# Patient Record
Sex: Female | Born: 1950 | Race: White | Hispanic: No | Marital: Married | Smoking: Former smoker
Health system: Southern US, Community
[De-identification: ages and names within clinical notes are randomized; demographics above are authoritative.]

## PROBLEM LIST (undated history)

## (undated) DIAGNOSIS — F419 Anxiety disorder, unspecified: Secondary | ICD-10-CM

## (undated) DIAGNOSIS — I499 Cardiac arrhythmia, unspecified: Secondary | ICD-10-CM

## (undated) DIAGNOSIS — E782 Mixed hyperlipidemia: Secondary | ICD-10-CM

## (undated) DIAGNOSIS — I209 Angina pectoris, unspecified: Secondary | ICD-10-CM

## (undated) DIAGNOSIS — I6529 Occlusion and stenosis of unspecified carotid artery: Secondary | ICD-10-CM

## (undated) DIAGNOSIS — I10 Essential (primary) hypertension: Secondary | ICD-10-CM

## (undated) DIAGNOSIS — K259 Gastric ulcer, unspecified as acute or chronic, without hemorrhage or perforation: Secondary | ICD-10-CM

## (undated) DIAGNOSIS — S43085A Other dislocation of left shoulder joint, initial encounter: Secondary | ICD-10-CM

## (undated) DIAGNOSIS — M797 Fibromyalgia: Secondary | ICD-10-CM

## (undated) DIAGNOSIS — F32A Depression, unspecified: Secondary | ICD-10-CM

## (undated) DIAGNOSIS — M1990 Unspecified osteoarthritis, unspecified site: Secondary | ICD-10-CM

## (undated) DIAGNOSIS — R0600 Dyspnea, unspecified: Secondary | ICD-10-CM

## (undated) DIAGNOSIS — N186 End stage renal disease: Secondary | ICD-10-CM

## (undated) DIAGNOSIS — I639 Cerebral infarction, unspecified: Secondary | ICD-10-CM

## (undated) DIAGNOSIS — N183 Chronic kidney disease, stage 3 unspecified: Secondary | ICD-10-CM

## (undated) DIAGNOSIS — Z87442 Personal history of urinary calculi: Secondary | ICD-10-CM

## (undated) DIAGNOSIS — Z992 Dependence on renal dialysis: Secondary | ICD-10-CM

## (undated) DIAGNOSIS — J189 Pneumonia, unspecified organism: Secondary | ICD-10-CM

## (undated) DIAGNOSIS — I63542 Cerebral infarction due to unspecified occlusion or stenosis of left cerebellar artery: Secondary | ICD-10-CM

## (undated) DIAGNOSIS — F329 Major depressive disorder, single episode, unspecified: Secondary | ICD-10-CM

## (undated) DIAGNOSIS — D649 Anemia, unspecified: Secondary | ICD-10-CM

## (undated) DIAGNOSIS — I509 Heart failure, unspecified: Secondary | ICD-10-CM

## (undated) DIAGNOSIS — R519 Headache, unspecified: Secondary | ICD-10-CM

## (undated) DIAGNOSIS — R32 Unspecified urinary incontinence: Secondary | ICD-10-CM

## (undated) DIAGNOSIS — E119 Type 2 diabetes mellitus without complications: Secondary | ICD-10-CM

## (undated) HISTORY — DX: Occlusion and stenosis of unspecified carotid artery: I65.29

## (undated) HISTORY — DX: Mixed hyperlipidemia: E78.2

## (undated) HISTORY — DX: Chronic kidney disease, stage 3 (moderate): N18.3

## (undated) HISTORY — DX: Essential (primary) hypertension: I10

## (undated) HISTORY — DX: Fibromyalgia: M79.7

## (undated) HISTORY — DX: Gastric ulcer, unspecified as acute or chronic, without hemorrhage or perforation: K25.9

## (undated) HISTORY — DX: Chronic kidney disease, stage 3 unspecified: N18.30

---

## 1978-10-31 HISTORY — PX: URETHRAL DILATION: SUR417

## 1979-03-02 HISTORY — PX: VAGINAL HYSTERECTOMY: SUR661

## 1979-03-02 HISTORY — PX: COMBINED HYSTERECTOMY VAGINAL W/ MMK / A&P REPAIR: SUR296

## 2004-12-02 ENCOUNTER — Ambulatory Visit: Payer: Self-pay | Admitting: Internal Medicine

## 2004-12-09 ENCOUNTER — Ambulatory Visit: Payer: Self-pay | Admitting: Internal Medicine

## 2004-12-09 ENCOUNTER — Ambulatory Visit (HOSPITAL_COMMUNITY): Admission: RE | Admit: 2004-12-09 | Discharge: 2004-12-09 | Payer: Self-pay | Admitting: Internal Medicine

## 2012-08-25 ENCOUNTER — Ambulatory Visit (INDEPENDENT_AMBULATORY_CARE_PROVIDER_SITE_OTHER): Payer: 59 | Admitting: Obstetrics and Gynecology

## 2012-08-25 ENCOUNTER — Encounter: Payer: Self-pay | Admitting: Obstetrics and Gynecology

## 2012-08-25 VITALS — BP 172/90 | Ht 65.0 in | Wt 200.0 lb

## 2012-08-25 DIAGNOSIS — N8110 Cystocele, unspecified: Secondary | ICD-10-CM

## 2012-08-25 DIAGNOSIS — N390 Urinary tract infection, site not specified: Secondary | ICD-10-CM

## 2012-08-25 DIAGNOSIS — N8111 Cystocele, midline: Secondary | ICD-10-CM

## 2012-08-25 MED ORDER — NITROFURANTOIN MONOHYD MACRO 100 MG PO CAPS: 100.0000 mg | ORAL_CAPSULE | Freq: Two times a day (BID) | ORAL | Status: DC

## 2012-08-25 MED ORDER — UROGESIC-BLUE 81.6 MG PO TABS: 1.0000 | ORAL_TABLET | Freq: Every evening | ORAL | Status: DC | PRN

## 2012-08-25 NOTE — Progress Notes (Signed)
Patient ID: Kelli [name], female   DOB: 14-Feb-1951, 62 y.o.   MRN: [id number]    [facility] Visit  Patient name: Kelli [name] MRN [id number]  Date of birth: 29-Sep-1950  CC & HPI:  Kelli [name] is a 62 y.o. female presenting today for pelvic heaviness. Pt here today for lower abdominal pain/heaviness. Pt has had a partial hysterectomy. Pt states she has had problem for about 3 and a half weeks. She states that she is also having urinary frequency month and a half, states she usually gets up anywhere between 3 to 5 times a night. Pt states she has a heavy feeling in her lower stomach that radiates to her left side. Pt has fibromyalgia and has issues with medications and sleep.  ROS:  No health care in yrs. Sees Dr. Willey Blade now. Constipation,x yrs   Pertinent History Reviewed:  Medical & Surgical Hx:  Reviewed: Significant for HTN.  TVH bladder tack in 40's. Medications: Reviewed & Updated - see associated section Social History: Reviewed -  reports that she has been smoking Cigarettes.  She has a 21 pack-year smoking history. She has never used smokeless tobacco.  Objective Findings:  Vitals: BP 172/90  Ht 5\' 5"  (1.651 m)  Wt 200 lb (90.719 kg)  BMI 33.28 kg/m2   Physical Examination: General appearance - alert, well appearing, and in no distress, oriented to person, place, and time and overweight Abdomen - soft, nontender, nondistended, no masses or organomegaly Pelvic - VULVA: normal appearing vulva with no masses, tenderness or lesions, VAGINA: normal appearing vagina with normal color and discharge, no lesions, PELVIC FLOOR EXAM: cystocele moderate, CERVIX: surgically absent, UTERUS: surgically absent, vaginal cuff well healed mild rectocele    Assessment & Plan:   Cystocele UTI Minimal sui sx Probable incomplete voiding    Plan Urogesic Blue samples x 10       Macrodantin bid x 10 d     U/a  C& s

## 2012-08-25 NOTE — Patient Instructions (Signed)
Cystocele Repair A cystocele is a bulging, drooping hernia or break (rupture) of bladder tissue into the birth canal (vagina). This bulging or rupture occurs on the top front wall of the vagina. CAUSES  Cystocele is associated with weakness of the top front wall of the vagina due to stretching and tearing of the ligaments and muscles in the area. This is often the result of:  Multiple childbirths.  Continuous heavy lifting.  Chronic cough from asthma, emphysema, or smoking.  Being overweight.  Changes from aging.  Previous surgery in the vaginal area.  Menopause with loss of estrogen hormone and weakening of the ligaments and muscles around the bladder. SYMPTOMS   Uncontrolled loss of urine (incontinence) with cough, sneeze, or exercise.  Pelvic pressure.  Frequency or urgency to urinate because of inability to completely empty the bladder.  Bladder infections.  Needing to push on the upper vagina to help yourself pass urine. DIAGNOSIS  A cystocele can be diagnosed by doing a pelvic exam and observing the top of the vagina drooping or bulging into or out of the vagina. TREATMENT  Surgical options:  Cystocele repair is surgery that removes the hernia.  There are also different "sling" operations that may be used. Discuss the different types of surgeries to repair a cystocele with your caregiver. Your caregiver will decide what type of surgery will be best in your case. Nonsurgical options:  Kegel exercises. This helps strengthen and tighten the muscles and tissue in and around the bladder and vagina. This may help with mild cases of cystocele.  A pessary may help the cystocele. A pessary is a plastic or rubber device that lifts the bladder into place. A pessary must be fitted by a doctor.  Tampons or diaphragms that lift the bladder into place are sometimes helpful with a minor or small cystocele.  Estrogen may help with mild cases in menopausal and aging women. LET YOUR  CAREGIVER KNOW ABOUT:   Allergies to food or medicine.  Medicines taken, including vitamins, herbs, eyedrops, over-the-counter medicines, and creams.  Use of steroids (by mouth or creams).  Previous problems with anesthetics or numbing medicines.  History of bleeding problems or blood clots.  Previous surgery.  Other health problems, including diabetes and kidney problems.  Possibility of pregnancy, if this applies. RISKS AND COMPLICATIONS  All surgery is associated with risks.  There are risks with a general anesthesia. You should discuss this with your caregiver.  With spinal or epidural anesthesia, there may be an area that is not numbed, and you could feel pain.  Headache could occur with a spinal or epidural anesthetic.  The catheter you will have after surgery may not work properly or may get blocked and need to be replaced.  Excessive bleeding.  Infection.  Injury to surrounding structures.  Recurrence of the cystocele.  Surgery may not get rid of your symptoms. BEFORE THE PROCEDURE   Do not take aspirin or blood thinners for 1 week prior to surgery, unless instructed otherwise.  Do not eat or drink anything after midnight the night before surgery.  Let your caregiver know if you develop a cold or other infectious problems prior to surgery.  If being admitted the day of surgery, you should be present 1 hour prior to your procedure or as directed by your caregiver.  Plan and arrange for help when you go home from the hospital.  If you smoke, do not smoke for at least 2 weeks before the surgery.  Do not  drink any alcohol for 3 days before the surgery. PROCEDURE  You will be given an anesthetic to prevent you from feeling pain during surgery. This may be a general anesthetic that puts you to sleep, or a spinal or epidural anesthetic. You will be asleep or be numbed through the entire procedure. During cystocele repair, tissue is pulled from the sides and  around the top of the vagina to lift up the hernia. This removes the hernia so that the top of the vagina does not fall into the opening of the vagina. AFTER THE PROCEDURE  After surgery, you will be taken to the recovery room where a nurse will take care of you, checking your breathing, blood pressure, pulse, and your progress. When your caregiver feels you are stable, you will be taken to your room. You will have a drainage tube (Foley catheter) that will drain your bladder for 2 to 7 days or longer, until your bladder is working properly. This catheter is placed prior to surgery to help keep your bladder empty and out of the way during the procedure. After surgery, this will make passing your urine easier. The catheter will be removed when you can easily pass urine without this assistance. You may have gauze packing in the vagina that will be removed 1 to 2 days after the surgery. Usually, you will be given a medicine (antibiotic) that kills germs. You will be given pain medicine as needed. You can usually go home in 3 to 5 days. HOME CARE INSTRUCTIONS   Do not take baths. Take showers until your caregiver informs you otherwise.  Take antibiotics as directed by your caregiver.  Exercise as instructed. Do not perform exercises which increase the pressure inside your belly (abdomen), such as sit-ups or lifting weights, until your caregiver has given permission. Walking exercise is preferred.  Only take over-the-counter or prescription medicines for pain and discomfort as directed by your caregiver.  Do not drink alcohol while taking pain medicine.  Do not lift anything over 5 pounds.  Do not drive until your caregiver gives you permission.  Get plenty of rest and sleep.  Have someone help with your household chores for 1 to 2 weeks.  If you develop constipation, you may take a mild laxative with your caregiver's permission. Eating bran foods and drinking enough water and fluids to keep your  urine clear or pale yellow helps with constipation.  Do not take aspirin. It may cause bleeding.  You may resume normal diet and unstrenuous activities as directed.  Do not douche, use tampons, or engage in intercourse until your surgeon has given permission.  Change bandages (dressings) as directed.  Make and keep all your postoperative appointments. SEEK MEDICAL CARE IF:   You have abnormal vaginal discharge.  You develop a rash.  You are having a reaction to your medicine.  You develop nausea or vomiting. SEEK IMMEDIATE MEDICAL CARE IF:   You have redness, swelling, or increasing pain in the vaginal area.  You notice pus coming from the vagina.  You have a fever.  You notice a bad smell coming from the vagina.  You have increasing abdominal pain.  You have frequent urination or you notice burning during urination.  You notice blood in your urine.  You have excessive vaginal bleeding.  You cannot urinate. MAKE SURE YOU:   Understand these instructions.  Will watch your condition.  Will get help right away if you are not doing well or get worse. Document Released:  02/13/2000 Document Revised: 05/10/2011 Document Reviewed: 05/15/2009 [employer] Patient Information 2014 [place].

## 2012-09-14 ENCOUNTER — Encounter: Payer: Self-pay | Admitting: Obstetrics and Gynecology

## 2012-09-14 ENCOUNTER — Ambulatory Visit (INDEPENDENT_AMBULATORY_CARE_PROVIDER_SITE_OTHER): Payer: 59 | Admitting: Obstetrics and Gynecology

## 2012-09-14 VITALS — BP 164/110 | Ht 65.0 in | Wt 200.6 lb

## 2012-09-14 DIAGNOSIS — IMO0002 Reserved for concepts with insufficient information to code with codable children: Secondary | ICD-10-CM

## 2012-09-14 DIAGNOSIS — N8111 Cystocele, midline: Secondary | ICD-10-CM

## 2012-09-14 NOTE — Progress Notes (Signed)
   [facility] Visit  Patient name: Kelli [name] MRN [id number]  Date of birth: 28-Feb-1951  CC & HPI:  Kelli [name] is a 62 y.o. female presenting today for followup cystocele, pelvic pressure, htn.  Now on new med HCTZ dr Quintin Alto.along with Taztia xt 360.  ROS:  1. Pt looking for new MD , frustrated with [facility]. Not enough time 2   Pertinent History Reviewed:  Medical & Surgical Hx:  Reviewed: Significant for  Medications: Reviewed & Updated - see associated section Social History: Reviewed -  reports that she has been smoking Cigarettes.  She has a 21 pack-year smoking history. She has never used smokeless tobacco. Has had episode of sui c cough this wk. Objective Findings:  Vitals: BP 210/110  Ht 5\' 5"  (1.651 m)  Wt 200 lb 9.6 oz (90.992 kg)  BMI 33.38 kg/m2 bp's at home better than here. Pt checks dailybp's are in [id number] diastolic.  Physical Examination: General appearance - alert, well appearing, and in no distress, oriented to person, place, and time and overweight Mental status - alert, oriented to person, place, and time, normal mood, behavior, speech, dress, motor activity, and thought processes   Assessment & Plan:   Cystocele No evidence of uti Chronic htn  Obesity    Weight loss Reck 3 ms Continue new diet habits Reck earlier prn complaints.

## 2012-09-14 NOTE — Patient Instructions (Signed)
Keep up the weight loss And the new diet habits Let Dr know if cough continues Return 3 months.

## 2012-09-15 ENCOUNTER — Ambulatory Visit: Payer: 59 | Admitting: Obstetrics and Gynecology

## 2012-09-29 DIAGNOSIS — I639 Cerebral infarction, unspecified: Secondary | ICD-10-CM

## 2012-09-29 HISTORY — DX: Cerebral infarction, unspecified: I63.9

## 2012-10-01 ENCOUNTER — Emergency Department (HOSPITAL_COMMUNITY): Payer: 59

## 2012-10-01 ENCOUNTER — Encounter (HOSPITAL_COMMUNITY): Payer: Self-pay | Admitting: *Deleted

## 2012-10-01 ENCOUNTER — Emergency Department (HOSPITAL_COMMUNITY)
Admission: EM | Admit: 2012-10-01 | Discharge: 2012-10-01 | Disposition: A | Discharge status: Home / Self Care | Payer: 59 | Attending: Emergency Medicine | Admitting: Emergency Medicine

## 2012-10-01 DIAGNOSIS — R209 Unspecified disturbances of skin sensation: Secondary | ICD-10-CM | POA: Insufficient documentation

## 2012-10-01 DIAGNOSIS — E876 Hypokalemia: Secondary | ICD-10-CM | POA: Insufficient documentation

## 2012-10-01 DIAGNOSIS — R059 Cough, unspecified: Secondary | ICD-10-CM | POA: Insufficient documentation

## 2012-10-01 DIAGNOSIS — R062 Wheezing: Secondary | ICD-10-CM | POA: Insufficient documentation

## 2012-10-01 DIAGNOSIS — R05 Cough: Secondary | ICD-10-CM | POA: Insufficient documentation

## 2012-10-01 DIAGNOSIS — R5383 Other fatigue: Secondary | ICD-10-CM | POA: Insufficient documentation

## 2012-10-01 DIAGNOSIS — R5381 Other malaise: Secondary | ICD-10-CM | POA: Insufficient documentation

## 2012-10-01 DIAGNOSIS — H538 Other visual disturbances: Secondary | ICD-10-CM | POA: Insufficient documentation

## 2012-10-01 DIAGNOSIS — F17200 Nicotine dependence, unspecified, uncomplicated: Secondary | ICD-10-CM | POA: Insufficient documentation

## 2012-10-01 DIAGNOSIS — Z79899 Other long term (current) drug therapy: Secondary | ICD-10-CM | POA: Insufficient documentation

## 2012-10-01 DIAGNOSIS — Z8739 Personal history of other diseases of the musculoskeletal system and connective tissue: Secondary | ICD-10-CM | POA: Insufficient documentation

## 2012-10-01 LAB — URINALYSIS, ROUTINE W REFLEX MICROSCOPIC
Glucose, UA: NEGATIVE mg/dL
Ketones, ur: NEGATIVE mg/dL
Leukocytes, UA: NEGATIVE
pH: 7 (ref 5.0–8.0)

## 2012-10-01 LAB — CBC WITH DIFFERENTIAL/PLATELET
Basophils Absolute: 0 10*3/uL (ref 0.0–0.1)
Basophils Relative: 0 % (ref 0–1)
HCT: 35.5 % — ABNORMAL LOW (ref 36.0–46.0)
Lymphocytes Relative: 23 % (ref 12–46)
MCHC: 34.4 g/dL (ref 30.0–36.0)
Neutro Abs: 7.2 10*3/uL (ref 1.7–7.7)
Neutrophils Relative %: 67 % (ref 43–77)
Platelets: 339 10*3/uL (ref 150–400)
RDW: 13.2 % (ref 11.5–15.5)
WBC: 10.8 10*3/uL — ABNORMAL HIGH (ref 4.0–10.5)

## 2012-10-01 LAB — COMPREHENSIVE METABOLIC PANEL
ALT: 15 U/L (ref 0–35)
AST: 13 U/L (ref 0–37)
Albumin: 3.2 g/dL — ABNORMAL LOW (ref 3.5–5.2)
Alkaline Phosphatase: 82 U/L (ref 39–117)
CO2: 28 mEq/L (ref 19–32)
Chloride: 96 mEq/L (ref 96–112)
Creatinine, Ser: 1.15 mg/dL — ABNORMAL HIGH (ref 0.50–1.10)
GFR calc non Af Amer: 50 mL/min — ABNORMAL LOW (ref >90)
Potassium: 2.6 mEq/L — CL (ref 3.5–5.1)
Sodium: 134 mEq/L — ABNORMAL LOW (ref 135–145)
Total Bilirubin: 0.2 mg/dL — ABNORMAL LOW (ref 0.3–1.2)

## 2012-10-01 MED ORDER — POTASSIUM CHLORIDE 10 MEQ/100ML IV SOLN
10.0000 meq | Freq: Once | INTRAVENOUS | Status: AC
  Administered 2012-10-01: 10 meq via INTRAVENOUS
  Filled 2012-10-01: qty 100

## 2012-10-01 MED ORDER — POTASSIUM CHLORIDE CRYS ER 20 MEQ PO TBCR
40.0000 meq | EXTENDED_RELEASE_TABLET | Freq: Once | ORAL | Status: AC
  Administered 2012-10-01: 40 meq via ORAL
  Filled 2012-10-01: qty 2

## 2012-10-01 MED ORDER — SODIUM CHLORIDE 0.9 % IV BOLUS (SEPSIS)
500.0000 mL | Freq: Once | INTRAVENOUS | Status: AC
  Administered 2012-10-01: 500 mL via INTRAVENOUS

## 2012-10-01 NOTE — Discharge Instructions (Signed)
Hypokalemia Hypokalemia means a low potassium level in the blood.Potassium is an electrolyte that helps regulate the amount of fluid in the body. It also stimulates muscle contraction and maintains a stable acid-base balance.Most of the body's potassium is inside of cells, and only a very small amount is in the blood. Because the amount in the blood is so small, minor changes can have big effects. PREPARATION FOR TEST Testing for potassium requires taking a blood sample taken by needle from a vein in the arm. The skin is cleaned thoroughly before the sample is drawn. There is no other special preparation needed. NORMAL VALUES Potassium levels below 3.5 mEq/L are abnormally low. Levels above 5.1 mEq/L are abnormally high. Ranges for normal findings may vary among different laboratories and hospitals. You should always check with your doctor after having lab work or other tests done to discuss the meaning of your test results and whether your values are considered within normal limits. MEANING OF TEST  Your caregiver will go over the test results with you and discuss the importance and meaning of your results, as well as treatment options and the need for additional tests, if necessary. A potassium level is frequently part of a routine medical exam. It is usually included as part of a whole "panel" of tests for several blood salts (such as Sodium and Chloride). It may be done as part of follow-up when a low potassium level was found in the past or other blood salts are suspected of being out of balance. A low potassium level might be suspected if you have one or more of the following:  Symptoms of weakness.  Abnormal heart rhythms.  High blood pressure and are taking medication to control this, especially water pills (diuretics).  Kidney disease that can affect your potassium level .  Diabetes requiring the use of insulin. The potassium may fall after taking insulin, especially if the diabetes  had been out of control for a while.  A condition requiring the use of cortisone-type medication or certain types of antibiotics.  Vomiting and/or diarrhea for more than a day or two.  A stomach or intestinal condition that may not permit appropriate absorption of potassium.  Fainting episodes.  Mental confusion. OBTAINING TEST RESULTS It is your responsibility to obtain your test results. Ask the lab or department performing the test when and how you will get your results.  Please contact your caregiver directly if you have not received the results within one week. At that time, ask if there is anything different or new you should be doing in relation to the results. TREATMENT Hypokalemia can be treated with potassium supplements taken by mouth and/or adjustments in your current medications. A diet high in potassium is also helpful. Foods with high potassium content are:  Peas, lentils, lima beans, nuts, and dried fruit.  Whole grain and bran cereals and breads.  Fresh fruit, vegetables (bananas, cantaloupe, grapefruit, oranges, tomatoes, honeydew melons, potatoes).  Orange and tomato juices.  Meats. If potassium supplement has been prescribed for you today or your medications have been adjusted, see your personal caregiver in time02 for a re-check. SEEK MEDICAL CARE IF:  There is a feeling of worsening weakness.  You experience repeated chest palpitations.  You are diabetic and having difficulty keeping your blood sugars in the normal range.  You are experiencing vomiting and/or diarrhea.  You are having difficulty with any of your regular medications. SEEK IMMEDIATE MEDICAL CARE IF:  You experience chest pain, shortness of  breath, or episodes of dizziness.  You have been having vomiting or diarrhea for more than 2 days.  You have a fainting episode. MAKE SURE YOU:   Understand these instructions.  Will watch your condition.  Will get help right away if you are  not doing well or get worse. Document Released: 02/15/2005 Document Revised: 05/10/2011 Document Reviewed: 01/27/2008 [facility] Patient Information 2014 [place], [place].   Foods Rich in Potassium Food / Potassium (mg)  Apricots, dried,  cup / 378 mg   Apricots, raw, 1 cup halves / 401 mg   Avocado,  / 487 mg   Banana, 1 large / 487 mg   Beef, lean, round, 3 oz / 202 mg   Cantaloupe, 1 cup cubes / 427 mg   Dates, medjool, 5 whole / 835 mg   Ham, cured, 3 oz / 212 mg   Lentils, dried,  cup / 458 mg   Lima beans, frozen,  cup / 258 mg   Orange, 1 large / 333 mg   Orange juice, 1 cup / 443 mg   Peaches, dried,  cup / 398 mg   Peas, split, cooked,  cup / 355 mg   Potato, boiled, 1 medium / 515 mg   Prunes, dried, uncooked,  cup / 318 mg   Raisins,  cup / 309 mg   Salmon, pink, raw, 3 oz / 275 mg   Sardines, canned , 3 oz / 338 mg   Tomato, raw, 1 medium / 292 mg   Tomato juice, 6 oz / 417 mg   [place], 3 oz / 349 mg  Document Released: 02/15/2005 Document Revised: 10/28/2010 Document Reviewed: 07/01/2008 [facility] Patient Information 2012 [place], [place].

## 2012-10-01 NOTE — ED Provider Notes (Signed)
CSN: [id number]     Arrival date & time 10/01/12  1624 History     First MD Initiated Contact with Patient 10/01/12 1723     Chief Complaint  Patient presents with  . Hypertension   (Consider location/radiation/quality/duration/timing/severity/associated sxs/prior Treatment) HPI Pt with several days of vague symptoms including fatigue, blurred vision and bl alternating tingling in bl hands. No fever, chills. No HA. No focal weakness. +cough with white sputum. No SOB, CP, abd pain, N/V/D, urinary symptoms. Pt recently had second BP med added to manage sustained elevated BP.  Past Medical History  Diagnosis Date  . Hypertension   . Fibromyalgia    Past Surgical History  Procedure Laterality Date  . Abdominal hysterectomy    . Urethral dilation     Family History  Problem Relation Age of Onset  . Diabetes Mother   . Diabetes Father   . Hypertension Sister   . Diabetes Brother   . Seizures Son   . Kidney disease Maternal Grandmother   . Hypertension Maternal Grandmother   . Heart disease Maternal Grandfather   . Diabetes Paternal Grandmother   . Heart disease Paternal Grandfather    History  Substance Use Topics  . Smoking status: Current Every Day Smoker -- 0.50 packs/day for 42 years    Types: Cigarettes  . Smokeless tobacco: Never Used  . Alcohol Use: No   OB History   Grav Para Term Preterm Abortions TAB SAB Ect Mult Living                 Review of Systems  Constitutional: Positive for fatigue. Negative for fever and chills.  HENT: Negative for sore throat, neck stiffness and sinus pressure.   Eyes: Positive for visual disturbance. Negative for photophobia, pain, discharge, redness and itching.  Respiratory: Positive for cough. Negative for shortness of breath.   Cardiovascular: Negative for chest pain, palpitations and leg swelling.  Gastrointestinal: Negative for nausea, vomiting and abdominal pain.  Genitourinary: Negative for dysuria, frequency and  hematuria.  Musculoskeletal: Negative for myalgias and back pain.  Skin: Negative for pallor, rash and wound.  Neurological: Positive for weakness (generalized). Negative for dizziness, light-headedness, numbness and headaches.  All other systems reviewed and are negative.    Allergies  Review of patient's allergies indicates no known allergies.  Home Medications   Current Outpatient Rx  Name  Route  Sig  Dispense  Refill  . diltiazem (TAZTIA XT) 360 MG 24 hr capsule   Oral   Take 360 mg by mouth daily.         . hydrochlorothiazide (HYDRODIURIL) 25 MG tablet   Oral   Take 25 mg by mouth daily.          BP 134/85  Pulse 89  Temp(Src) 98.8 F (37.1 C) (Oral)  Resp 14  Ht 5\' 5"  (1.651 m)  Wt 196 lb (88.905 kg)  BMI 32.62 kg/m2  SpO2 97% Physical Exam  Nursing note and vitals reviewed. Constitutional: She is oriented to person, place, and time. She appears well-developed and well-nourished. No distress.  HENT:  Head: Normocephalic and atraumatic.  Mouth/Throat: Oropharynx is clear and moist.  Eyes: EOM are normal. Pupils are equal, round, and reactive to light.  Neck: Normal range of motion. Neck supple. No thyromegaly present.  Cardiovascular: Normal rate and regular rhythm.   Pulmonary/Chest: Effort normal. No respiratory distress. She has wheezes (few scattered wheezes). She has no rales. She exhibits no tenderness.  Abdominal: Soft. Bowel sounds  are normal. She exhibits no distension and no mass. There is no tenderness. There is no rebound and no guarding.  Musculoskeletal: Normal range of motion. She exhibits no edema and no tenderness.  No calf swelling or pain  Neurological: She is alert and oriented to person, place, and time.  5/5 motor in all ext, sensation intact, visual fields normal, bl finger to nose intact  Skin: Skin is warm and dry. No rash noted. No erythema.  Psychiatric: She has a normal mood and affect. Her behavior is normal.    ED Course    Procedures (including critical care time)  Labs Reviewed  CBC WITH DIFFERENTIAL - Abnormal; Notable for the following:    WBC 10.8 (*)    HCT 35.5 (*)    All other components within normal limits  COMPREHENSIVE METABOLIC PANEL - Abnormal; Notable for the following:    Sodium 134 (*)    Potassium 2.6 (*)    Glucose, Bld 170 (*)    Creatinine, Ser 1.15 (*)    Albumin 3.2 (*)    Total Bilirubin 0.2 (*)    GFR calc non Af Amer 50 (*)    GFR calc Af Amer 58 (*)    All other components within normal limits  URINALYSIS, ROUTINE W REFLEX MICROSCOPIC - Abnormal; Notable for the following:    Hgb urine dipstick SMALL (*)    Protein, ur >300 (*)    All other components within normal limits  POTASSIUM - Abnormal; Notable for the following:    Potassium 3.0 (*)    All other components within normal limits  TROPONIN I  URINE MICROSCOPIC-ADD ON   Dg Chest 2 View  10/01/2012   *RADIOLOGY REPORT*  Clinical Data: Hypertension, left arm weakness  CHEST - 2 VIEW  Comparison: None.  Findings: Normal heart size and vascularity.  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  Trachea is midline.  Negative for CHF or pneumonia.  No collapse or consolidation.  Negative for effusion or pneumothorax.  Trachea is midline.  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 Annamaria Boots, M.D.   Ct Head Wo Contrast  10/01/2012   *RADIOLOGY REPORT*  Clinical Data: Hypertension, weakness  CT HEAD WITHOUT CONTRAST  Technique:  Contiguous axial images were obtained from the base of the skull through the vertex without contrast.  Comparison: None.  Findings: Patchy moderate white matter microvascular ischemic changes throughout both cerebral hemispheres.  No definite acute intracranial hemorrhage, infarction, mass lesion, midline shift, herniation, hydrocephalus, or extra-axial fluid  collection.  No focal mass effect or edema.  Cisterns are patent.  No cerebellar abnormality.  Symmetric orbits.  Mastoids and sinuses are clear.  IMPRESSION: Chronic microvascular ischemic changes in the white matter.  No acute process by noncontrast CT.   Original Report Authenticated By: Jerilynn Mages. Shick, M.D.   1. Hypokalemia     Date: 10/01/2012  Rate:80  Rhythm: normal sinus rhythm  QRS Axis: normal  Intervals: normal  ST/T Wave abnormalities: nonspecific T wave changes  Conduction Disutrbances:none  Narrative Interpretation:   Old EKG Reviewed: none available   MDM  Potassium replaced in ED. Pt with improved symptoms. Pt encouraged to f/u with PMD for medication adjustment and to re-check potassium. Return precautions given.   Julianne Rice, MD 10/01/12 2215

## 2012-10-01 NOTE — ED Notes (Signed)
Pt reports that she has been having problems with her blood pressure being elevated, was placed on additional bp medication a few weeks ago, bp still continues to be elevated, reports that she has been having blurry vision that has gotten worse since yesterday, alternating numbness in both arms, denies any Headache, speech clear.

## 2012-10-01 NOTE — ED Notes (Signed)
Discharge instructions given and reviewed with patient.  Patient verbalized understanding to eat potassium rich foods and follow up with her PMD as needed.  Patient ambulatory with steady gait; discharged home in good condition.

## 2012-10-03 ENCOUNTER — Emergency Department (HOSPITAL_COMMUNITY): Payer: 59

## 2012-10-03 ENCOUNTER — Inpatient Hospital Stay (HOSPITAL_COMMUNITY)
Admission: EM | Admit: 2012-10-03 | Discharge: 2012-10-09 | DRG: 039 | Disposition: A | Discharge status: Rehabilitation Facility | Payer: 59 | Attending: Internal Medicine | Admitting: Internal Medicine

## 2012-10-03 ENCOUNTER — Encounter (HOSPITAL_COMMUNITY): Payer: Self-pay | Admitting: *Deleted

## 2012-10-03 DIAGNOSIS — F17200 Nicotine dependence, unspecified, uncomplicated: Secondary | ICD-10-CM | POA: Diagnosis present

## 2012-10-03 DIAGNOSIS — E7800 Pure hypercholesterolemia, unspecified: Secondary | ICD-10-CM | POA: Diagnosis present

## 2012-10-03 DIAGNOSIS — N289 Disorder of kidney and ureter, unspecified: Secondary | ICD-10-CM | POA: Diagnosis present

## 2012-10-03 DIAGNOSIS — Z833 Family history of diabetes mellitus: Secondary | ICD-10-CM

## 2012-10-03 DIAGNOSIS — I129 Hypertensive chronic kidney disease with stage 1 through stage 4 chronic kidney disease, or unspecified chronic kidney disease: Secondary | ICD-10-CM | POA: Diagnosis present

## 2012-10-03 DIAGNOSIS — E669 Obesity, unspecified: Secondary | ICD-10-CM | POA: Diagnosis present

## 2012-10-03 DIAGNOSIS — E782 Mixed hyperlipidemia: Secondary | ICD-10-CM | POA: Diagnosis present

## 2012-10-03 DIAGNOSIS — I10 Essential (primary) hypertension: Secondary | ICD-10-CM | POA: Diagnosis present

## 2012-10-03 DIAGNOSIS — R739 Hyperglycemia, unspecified: Secondary | ICD-10-CM | POA: Diagnosis present

## 2012-10-03 DIAGNOSIS — Z720 Tobacco use: Secondary | ICD-10-CM | POA: Diagnosis present

## 2012-10-03 DIAGNOSIS — Z8673 Personal history of transient ischemic attack (TIA), and cerebral infarction without residual deficits: Secondary | ICD-10-CM | POA: Diagnosis present

## 2012-10-03 DIAGNOSIS — I6340 Cerebral infarction due to embolism of unspecified cerebral artery: Secondary | ICD-10-CM

## 2012-10-03 DIAGNOSIS — R29898 Other symptoms and signs involving the musculoskeletal system: Secondary | ICD-10-CM | POA: Diagnosis present

## 2012-10-03 DIAGNOSIS — N189 Chronic kidney disease, unspecified: Secondary | ICD-10-CM | POA: Diagnosis present

## 2012-10-03 DIAGNOSIS — Z8249 Family history of ischemic heart disease and other diseases of the circulatory system: Secondary | ICD-10-CM

## 2012-10-03 DIAGNOSIS — Z841 Family history of disorders of kidney and ureter: Secondary | ICD-10-CM

## 2012-10-03 DIAGNOSIS — E785 Hyperlipidemia, unspecified: Secondary | ICD-10-CM | POA: Diagnosis present

## 2012-10-03 DIAGNOSIS — I639 Cerebral infarction, unspecified: Secondary | ICD-10-CM

## 2012-10-03 DIAGNOSIS — R471 Dysarthria and anarthria: Secondary | ICD-10-CM | POA: Diagnosis present

## 2012-10-03 DIAGNOSIS — IMO0001 Reserved for inherently not codable concepts without codable children: Secondary | ICD-10-CM | POA: Diagnosis present

## 2012-10-03 DIAGNOSIS — R209 Unspecified disturbances of skin sensation: Secondary | ICD-10-CM | POA: Diagnosis present

## 2012-10-03 DIAGNOSIS — R4701 Aphasia: Secondary | ICD-10-CM | POA: Diagnosis present

## 2012-10-03 DIAGNOSIS — I63239 Cerebral infarction due to unspecified occlusion or stenosis of unspecified carotid arteries: Principal | ICD-10-CM | POA: Diagnosis present

## 2012-10-03 HISTORY — DX: Unspecified urinary incontinence: R32

## 2012-10-03 HISTORY — DX: Cerebral infarction, unspecified: I63.9

## 2012-10-03 LAB — PROTIME-INR
INR: 0.94 (ref 0.00–1.49)
Prothrombin Time: 12.4 seconds (ref 11.6–15.2)

## 2012-10-03 LAB — COMPREHENSIVE METABOLIC PANEL
Albumin: 3.6 g/dL (ref 3.5–5.2)
BUN: 13 mg/dL (ref 6–23)
Calcium: 9.5 mg/dL (ref 8.4–10.5)
Creatinine, Ser: 1.14 mg/dL — ABNORMAL HIGH (ref 0.50–1.10)
Total Protein: 7.4 g/dL (ref 6.0–8.3)

## 2012-10-03 LAB — GLUCOSE, CAPILLARY: Glucose-Capillary: 127 mg/dL — ABNORMAL HIGH (ref 70–99)

## 2012-10-03 LAB — DIFFERENTIAL
Eosinophils Absolute: 0.1 10*3/uL (ref 0.0–0.7)
Eosinophils Relative: 1 % (ref 0–5)
Lymphocytes Relative: 27 % (ref 12–46)
Monocytes Absolute: 0.9 10*3/uL (ref 0.1–1.0)
Neutro Abs: 8.3 10*3/uL — ABNORMAL HIGH (ref 1.7–7.7)
Neutrophils Relative %: 65 % (ref 43–77)

## 2012-10-03 LAB — APTT: aPTT: 21 seconds — ABNORMAL LOW (ref 24–37)

## 2012-10-03 LAB — CBC
MCH: 29.7 pg (ref 26.0–34.0)
MCHC: 35.4 g/dL (ref 30.0–36.0)
Platelets: 344 10*3/uL (ref 150–400)
RBC: 4.78 MIL/uL (ref 3.87–5.11)

## 2012-10-03 LAB — POCT I-STAT TROPONIN I: Troponin i, poc: 0.02 ng/mL (ref 0.00–0.08)

## 2012-10-03 LAB — TROPONIN I: Troponin I: <0.3 ng/mL (ref <0.30)

## 2012-10-03 MED ORDER — SODIUM CHLORIDE 0.9 % IV SOLN: INTRAVENOUS | Status: DC

## 2012-10-03 MED ORDER — SODIUM CHLORIDE 0.9 % IV BOLUS (SEPSIS)
500.0000 mL | Freq: Once | INTRAVENOUS | Status: AC
  Administered 2012-10-04: 500 mL via INTRAVENOUS

## 2012-10-03 NOTE — Consult Note (Signed)
Referring Physician: ED/ Dr. Rogene Houston    Chief Complaint: unsteadiness, right upper extremity numbness, heaviness bilateral UE, disorientation.  HPI:                                                                                                                                         AVIONA CLUTE is an 62 y.o. female, right handed, with a past medical history significant for HTN, smoking, fibromyalgia, sent from [facility] for further evaluation of the above mentioned symptoms and MRI brain showed bi-cerebral strokes. Unfortunately, I was not able to review patient brain MRI performed earlier today. In any case, she tells me on 8/2  she started noticing some unusual fatigue associated with right arm numbness, poor balance, disorientation, confused to the point that she was not able to connect things, blurred vision, as well as a sensation of heaviness both arms. Never had similar symptoms before and decided to go to to [facility] ED the next day where she had a CT brain that showed no acute intracranial abnormality..    Her symptoms persisted and then she had a brain MRI at [facility] that revealed multiple ischemic infarcts in both cerebral hemispheres. A CT brain was performed upon arrival to [facility] ED which disclosed multi focal acute/subacute ischemia throughout the cerebral hemispheres bilaterally, similar to recent brain MRI 10/03/2012. Denies headache, vertigo, double vision, difficulty swallowing, slurred speech, language or vision impairment. No recent fever, infection, head or neck trauma.  Date last known well: 09/30/12 Time last known well: uncertain tPA Given: no, late presentation NIHSS: 1 MRS: 0   Past Medical History  Diagnosis Date  . Hypertension   . Fibromyalgia     Past Surgical History  Procedure Laterality Date  . Abdominal hysterectomy    . Urethral dilation      Family History  Problem Relation Age of Onset  . Diabetes Mother   . Diabetes Father   .  Hypertension Sister   . Diabetes Brother   . Seizures Son   . Kidney disease Maternal Grandmother   . Hypertension Maternal Grandmother   . Heart disease Maternal Grandfather   . Diabetes Paternal Grandmother   . Heart disease Paternal Grandfather    Social History:  reports that she has been smoking Cigarettes.  She has a 21 pack-year smoking history. She has never used smokeless tobacco. She reports that she does not drink alcohol or use illicit drugs.  Allergies:  Allergies  Allergen Reactions  . Hydrochlorothiazide Nausea And Vomiting    Medications:  I have reviewed the patient's current medications.  ROS:                                                                                                                                       History obtained from the patient and chart review.  General ROS: negative for - chills, fever, night sweats, weight gain or weight loss Psychological ROS: negative for - behavioral disorder, hallucinations, memory difficulties, mood swings or suicidal ideation Ophthalmic ROS: negative for - double vision, eye pain or loss of vision ENT ROS: negative for - epistaxis, nasal discharge, oral lesions, sore throat, tinnitus or vertigo Allergy and Immunology ROS: negative for - hives or itchy/watery eyes Hematological and Lymphatic ROS: negative for - bleeding problems, bruising or swollen lymph nodes Endocrine ROS: negative for - galactorrhea, hair pattern changes, polydipsia/polyuria or temperature intolerance Respiratory ROS: negative for - cough, hemoptysis, shortness of breath or wheezing Cardiovascular ROS: negative for - chest pain, dyspnea on exertion, edema or irregular heartbeat Gastrointestinal ROS: negative for - abdominal pain, diarrhea, hematemesis, nausea/vomiting or stool incontinence Genito-Urinary ROS: negative  for - dysuria, hematuria, incontinence or urinary frequency/urgency Musculoskeletal ROS: negative for - joint swelling or muscular weakness Neurological ROS: as noted in HPI Dermatological ROS: negative for rash and skin lesion changes    Physical exam: pleasant female in no apparent distress.Blood pressure 218/88, pulse 85, temperature 98.3 F (36.8 C), temperature source Oral, resp. rate 16, SpO2 98.00%.  Head: normocephalic. Neck: supple, no bruits, no JVD. Cardiac: no murmurs. Lungs: clear. Abdomen: soft, no tender, no mass. Extremities: no edema.  Neurologic Examination:                                                                                                      Mental Status: Alert, oriented, thought content appropriate.  Speech fluent without evidence of aphasia.  Able to follow 3 step commands without difficulty. Cranial Nerves: II: Discs flat bilaterally; Visual fields grossly normal, pupils equal, round, reactive to light and accommodation III,IV, VI: ptosis not present, extra-ocular motions intact bilaterally V,VII: smile symmetric, facial light touch sensation normal bilaterally VIII: hearing normal bilaterally IX,X: gag reflex present XI: bilateral shoulder shrug XII: midline tongue extension Motor: Significant for mild left pronator drift. Tone and bulk:normal tone throughout; no atrophy noted Sensory: Pinprick and light touch intact Deep Tendon Reflexes:  Generalized hyperreflexia without clonus.  Plantars: Right: downgoing   Left: downgoing Cerebellar: normal finger-to-nose,  normal heel-to-shin test Gait:  No  ataxia. CV: pulses palpable throughout      Results for orders placed during the hospital encounter of 10/03/12 (from the past 48 hour(s))  PROTIME-INR     Status: None   Collection Time    10/03/12  6:13 PM      Result Value Range   Prothrombin Time 12.4  11.6 - 15.2 seconds   INR 0.94  0.00 - 1.49  APTT     Status: Abnormal    Collection Time    10/03/12  6:13 PM      Result Value Range   aPTT 21 (*) 24 - 37 seconds  CBC     Status: Abnormal   Collection Time    10/03/12  6:13 PM      Result Value Range   WBC 12.8 (*) 4.0 - 10.5 K/uL   Comment: WHITE COUNT CONFIRMED ON SMEAR   RBC 4.78  3.87 - 5.11 MIL/uL   Hemoglobin 14.2  12.0 - 15.0 g/dL   HCT 40.1  36.0 - 46.0 %   MCV 83.9  78.0 - 100.0 fL   MCH 29.7  26.0 - 34.0 pg   MCHC 35.4  30.0 - 36.0 g/dL   RDW 13.4  11.5 - 15.5 %   Platelets 344  150 - 400 K/uL   Comment: PLATELET COUNT CONFIRMED BY SMEAR     REPEATED TO VERIFY  DIFFERENTIAL     Status: Abnormal   Collection Time    10/03/12  6:13 PM      Result Value Range   Neutrophils Relative % 65  43 - 77 %   Lymphocytes Relative 27  12 - 46 %   Monocytes Relative 7  3 - 12 %   Eosinophils Relative 1  0 - 5 %   Basophils Relative 0  0 - 1 %   RBC Morphology POLYCHROMASIA PRESENT     WBC Morphology ATYPICAL LYMPHOCYTES     Smear Review LARGE PLATELETS PRESENT     Neutro Abs 8.3 (*) 1.7 - 7.7 K/uL   Lymphs Abs 3.5  0.7 - 4.0 K/uL   Monocytes Absolute 0.9  0.1 - 1.0 K/uL   Eosinophils Absolute 0.1  0.0 - 0.7 K/uL   Basophils Absolute 0.0  0.0 - 0.1 K/uL  COMPREHENSIVE METABOLIC PANEL     Status: Abnormal   Collection Time    10/03/12  6:13 PM      Result Value Range   Sodium 134 (*) 135 - 145 mEq/L   Potassium 3.6  3.5 - 5.1 mEq/L   Chloride 97  96 - 112 mEq/L   CO2 26  19 - 32 mEq/L   Glucose, Bld 143 (*) 70 - 99 mg/dL   BUN 13  6 - 23 mg/dL   Creatinine, Ser 1.14 (*) 0.50 - 1.10 mg/dL   Calcium 9.5  8.4 - 10.5 mg/dL   Total Protein 7.4  6.0 - 8.3 g/dL   Albumin 3.6  3.5 - 5.2 g/dL   AST 16  0 - 37 U/L   ALT 14  0 - 35 U/L   Alkaline Phosphatase 92  39 - 117 U/L   Total Bilirubin 0.3  0.3 - 1.2 mg/dL   GFR calc non Af Amer 50 (*) >90 mL/min   GFR calc Af Amer 58 (*) >90 mL/min   Comment:            The eGFR has been calculated     using the CKD EPI equation.  This calculation  has not been     validated in all clinical     situations.     eGFR's persistently     <90 mL/min signify     possible Chronic Kidney Disease.  TROPONIN I     Status: None   Collection Time    10/03/12  6:15 PM      Result Value Range   Troponin I <0.30  <0.30 ng/mL   Comment:            Due to the release kinetics of cTnI,     a negative result within the first hours     of the onset of symptoms does not rule out     myocardial infarction with certainty.     If myocardial infarction is still suspected,     repeat the test at appropriate intervals.  GLUCOSE, CAPILLARY     Status: Abnormal   Collection Time    10/03/12  6:16 PM      Result Value Range   Glucose-Capillary 127 (*) 70 - 99 mg/dL   Comment 1 Notify RN    POCT I-STAT TROPONIN I     Status: None   Collection Time    10/03/12  6:30 PM      Result Value Range   Troponin i, poc 0.02  0.00 - 0.08 ng/mL   Comment 3            Comment: Due to the release kinetics of cTnI,     a negative result within the first hours     of the onset of symptoms does not rule out     myocardial infarction with certainty.     If myocardial infarction is still suspected,     repeat the test at appropriate intervals.   Ct Head Wo Contrast  10/03/2012   *RADIOLOGY REPORT*  Clinical Data: Dizziness.  CT HEAD WITHOUT CONTRAST  Technique:  Contiguous axial images were obtained from the base of the skull through the vertex without contrast.  Comparison: MRI of the brain 10/03/2012.  Findings: There are multifocal ill-defined areas of decreased attenuation throughout the deep and periventricular white matter of the cerebral hemispheres bilaterally.  While some of these are compatible with chronic microvascular ischemic changes, many are more focal and lower density than typically seen in the setting of chronic microvascular ischemia, and are compatible with evolving acute/subacute infarctions. Similar findings are also found in the subcortical white  matter and portions of the cortex bilaterally. No acute intracranial hemorrhage.  No evidence of mass, mass effect, hydrocephalus or abnormal intra or extra-axial fluid collections.  Visualized paranasal sinuses and mastoids are well pneumatized.  No acute displaced skull fractures are noted.  IMPRESSION: 1.  Multi focal acute/subacute ischemia throughout the cerebral hemispheres bilaterally, similar to recent brain MRI 10/03/2012. Findings are again concerning for multifocal embolic infarcts from potential cardiac source.  Echocardiographic correlation for evidence of intracardiac thrombus or vegetation from infective endocarditis is strongly recommended.  Critical Value/emergent results were called by telephone at the time of interpretation on 10/03/2012 at 08:58 p.m. to Dr. Rogene Houston, who verbally acknowledged these results.   Original Report Authenticated By: Vinnie Langton, M.D.      Assessment: 62 y.o. female with recent onset unsteadiness, right upper extremity numbness, heaviness bilateral UE, disorientation, fatigue, and blurred vision. Neuro-exam significant only for left pronator drift. MRI-DWI and CT brain disclosed multiple areas of bi-cerebral ischemic infarcts highly suggestive of embolism, probably arising from  a cardiac source. Her symptoms started 09/30/12. Needs to complete stroke work up.  Aspirin for now pending results of stroke investigations,    Stroke Risk Factors -  HTN, smoking.  Plan: 1. HgbA1c, fasting lipid panel 2. MRI, MRA  of the brain without contrast 3. Echocardiogram 4. Carotid dopplers 5. Prophylactic therapy-Antiplatelet med: Aspirin - dose 81 mg 6. Risk factor modification 7. Telemetry monitoring 8. Frequent neuro checks 9. PT/OT SLP   Dorian Pod, MD [facility] [phone]  10/03/2012, 11:53 PM

## 2012-10-03 NOTE — ED Notes (Signed)
Admitting MD at bedside.  Pt remains alert and oriented x's 3.  Skin warm and dry color appropriate.

## 2012-10-03 NOTE — ED Provider Notes (Signed)
CSN: [id number]     Arrival date & time 10/03/12  1738 History     First MD Initiated Contact with Patient 10/03/12 1842     Chief Complaint  Patient presents with  . Dizziness   (Consider location/radiation/quality/duration/timing/severity/associated sxs/prior Treatment) The history is provided by the patient.   62 year old female referred in by her primary care doctor in the [place] area. Patient earlier today had an MRI done at [facility] that showed abnormalities and was sent here by POV. [name] had numbness dizziness tingling in hands weakness right greater than left since Saturday. Patient seen [facility]10 hospital on Sunday with a negative head CT. Patient at that time had a low potassium which was treated. [name] symptoms include some speech abnormalities but with talking with family seems to be as if his speech pattern was slow. No obvious focal deficit. No headache no nausea vomiting or chest pain no shortness of breath.  Past Medical History  Diagnosis Date  . Hypertension   . Fibromyalgia    Past Surgical History  Procedure Laterality Date  . Abdominal hysterectomy    . Urethral dilation     Family History  Problem Relation Age of Onset  . Diabetes Mother   . Diabetes Father   . Hypertension Sister   . Diabetes Brother   . Seizures Son   . Kidney disease Maternal Grandmother   . Hypertension Maternal Grandmother   . Heart disease Maternal Grandfather   . Diabetes Paternal Grandmother   . Heart disease Paternal Grandfather    History  Substance Use Topics  . Smoking status: Current Every Day Smoker -- 0.50 packs/day for 42 years    Types: Cigarettes  . Smokeless tobacco: Never Used  . Alcohol Use: No   OB History   Grav Para Term Preterm Abortions TAB SAB Ect Mult Living                 Review of Systems  Constitutional: Positive for fatigue. Negative for fever and chills.  HENT: Negative for congestion and neck pain.   Eyes: Negative for photophobia  and visual disturbance.  Respiratory: Negative for shortness of breath.   Cardiovascular: Negative for chest pain.  Gastrointestinal: Negative for nausea, vomiting and abdominal pain.  Genitourinary: Negative for dysuria.  Musculoskeletal: Negative for back pain.  Skin: Negative for rash.  Neurological: Positive for dizziness, speech difficulty, weakness, light-headedness and numbness.  Hematological: Does not bruise/bleed easily.  Psychiatric/Behavioral: Negative for confusion.    Allergies  Hydrochlorothiazide  Home Medications   Current Outpatient Rx  Name  Route  Sig  Dispense  Refill  . clonazePAM (KLONOPIN) 0.5 MG tablet   Oral   Take 0.5 mg by mouth 2 (two) times daily as needed for anxiety.         Marland Kitchen diltiazem (TAZTIA XT) 360 MG 24 hr capsule   Oral   Take 360 mg by mouth daily.         . meclizine (ANTIVERT) 25 MG tablet   Oral   Take 25 mg by mouth daily as needed for dizziness.         . potassium chloride SA (K-DUR,KLOR-CON) 20 MEQ tablet   Oral   Take 20 mEq by mouth daily.         . ranitidine (ZANTAC) 150 MG tablet   Oral   Take 150 mg by mouth daily as needed for heartburn.          BP 218/88  Pulse 85  Temp(Src) 98.3 F (36.8 C) (Oral)  Resp 16  SpO2 98% Physical Exam  Nursing note and vitals reviewed. Constitutional: She is oriented to person, place, and time. She appears well-developed and well-nourished. No distress.  HENT:  Head: Normocephalic and atraumatic.  Mouth/Throat: Oropharynx is clear and moist.  Eyes: Conjunctivae and EOM are normal. Pupils are equal, round, and reactive to light.  Neck: Normal range of motion. Neck supple.  Cardiovascular: Normal rate, regular rhythm and normal heart sounds.   No murmur heard. Pulmonary/Chest: Effort normal and breath sounds normal. No respiratory distress.  Abdominal: Soft. Bowel sounds are normal. There is no tenderness.  Musculoskeletal: Normal range of motion. She exhibits no  edema.  Neurological: She is alert and oriented to person, place, and time. No cranial nerve deficit.  The patient is alert and oriented neuro exam without significant findings of weakness. However does have arm drift on the left side.  Skin: Skin is warm. No erythema.    ED Course   Procedures (including critical care time)  Labs Reviewed  APTT - Abnormal; Notable for the following:    aPTT 21 (*)    All other components within normal limits  CBC - Abnormal; Notable for the following:    WBC 12.8 (*)    All other components within normal limits  DIFFERENTIAL - Abnormal; Notable for the following:    Neutro Abs 8.3 (*)    All other components within normal limits  COMPREHENSIVE METABOLIC PANEL - Abnormal; Notable for the following:    Sodium 134 (*)    Glucose, Bld 143 (*)    Creatinine, Ser 1.14 (*)    GFR calc non Af Amer 50 (*)    GFR calc Af Amer 58 (*)    All other components within normal limits  GLUCOSE, CAPILLARY - Abnormal; Notable for the following:    Glucose-Capillary 127 (*)    All other components within normal limits  PROTIME-INR  TROPONIN I  POCT I-STAT TROPONIN I   Ct Head Wo Contrast  10/03/2012   *RADIOLOGY REPORT*  Clinical Data: Dizziness.  CT HEAD WITHOUT CONTRAST  Technique:  Contiguous axial images were obtained from the base of the skull through the vertex without contrast.  Comparison: MRI of the brain 10/03/2012.  Findings: There are multifocal ill-defined areas of decreased attenuation throughout the deep and periventricular white matter of the cerebral hemispheres bilaterally.  While some of these are compatible with chronic microvascular ischemic changes, many are more focal and lower density than typically seen in the setting of chronic microvascular ischemia, and are compatible with evolving acute/subacute infarctions. Similar findings are also found in the subcortical white matter and portions of the cortex bilaterally. No acute intracranial  hemorrhage.  No evidence of mass, mass effect, hydrocephalus or abnormal intra or extra-axial fluid collections.  Visualized paranasal sinuses and mastoids are well pneumatized.  No acute displaced skull fractures are noted.  IMPRESSION: 1.  Multi focal acute/subacute ischemia throughout the cerebral hemispheres bilaterally, similar to recent brain MRI 10/03/2012. Findings are again concerning for multifocal embolic infarcts from potential cardiac source.  Echocardiographic correlation for evidence of intracardiac thrombus or vegetation from infective endocarditis is strongly recommended.  Critical Value/emergent results were called by telephone at the time of interpretation on 10/03/2012 at 08:58 p.m. to Dr. Rogene Houston, who verbally acknowledged these results.   Original Report Authenticated By: Vinnie Langton, M.D.    Results for orders placed during the hospital encounter of 10/03/12  [facility]  Result Value Range   Prothrombin Time 12.4  11.6 - 15.2 seconds   INR 0.94  0.00 - 1.49  APTT      Result Value Range   aPTT 21 (*) 24 - 37 seconds  CBC      Result Value Range   WBC 12.8 (*) 4.0 - 10.5 K/uL   RBC 4.78  3.87 - 5.11 MIL/uL   Hemoglobin 14.2  12.0 - 15.0 g/dL   HCT 40.1  36.0 - 46.0 %   MCV 83.9  78.0 - 100.0 fL   MCH 29.7  26.0 - 34.0 pg   MCHC 35.4  30.0 - 36.0 g/dL   RDW 13.4  11.5 - 15.5 %   Platelets 344  150 - 400 K/uL  DIFFERENTIAL      Result Value Range   Neutrophils Relative % 65  43 - 77 %   Lymphocytes Relative 27  12 - 46 %   Monocytes Relative 7  3 - 12 %   Eosinophils Relative 1  0 - 5 %   Basophils Relative 0  0 - 1 %   RBC Morphology POLYCHROMASIA PRESENT     WBC Morphology ATYPICAL LYMPHOCYTES     Smear Review LARGE PLATELETS PRESENT     Neutro Abs 8.3 (*) 1.7 - 7.7 K/uL   Lymphs Abs 3.5  0.7 - 4.0 K/uL   Monocytes Absolute 0.9  0.1 - 1.0 K/uL   Eosinophils Absolute 0.1  0.0 - 0.7 K/uL   Basophils Absolute 0.0  0.0 - 0.1 K/uL  COMPREHENSIVE  METABOLIC PANEL      Result Value Range   Sodium 134 (*) 135 - 145 mEq/L   Potassium 3.6  3.5 - 5.1 mEq/L   Chloride 97  96 - 112 mEq/L   CO2 26  19 - 32 mEq/L   Glucose, Bld 143 (*) 70 - 99 mg/dL   BUN 13  6 - 23 mg/dL   Creatinine, Ser 1.14 (*) 0.50 - 1.10 mg/dL   Calcium 9.5  8.4 - 10.5 mg/dL   Total Protein 7.4  6.0 - 8.3 g/dL   Albumin 3.6  3.5 - 5.2 g/dL   AST 16  0 - 37 U/L   ALT 14  0 - 35 U/L   Alkaline Phosphatase 92  39 - 117 U/L   Total Bilirubin 0.3  0.3 - 1.2 mg/dL   GFR calc non Af Amer 50 (*) >90 mL/min   GFR calc Af Amer 58 (*) >90 mL/min  TROPONIN I      Result Value Range   Troponin I <0.30  <0.30 ng/mL  GLUCOSE, CAPILLARY      Result Value Range   Glucose-Capillary 127 (*) 70 - 99 mg/dL   Comment 1 Notify RN    POCT I-STAT TROPONIN I      Result Value Range   Troponin i, poc 0.02  0.00 - 0.08 ng/mL   Comment 3             Date: 10/03/2012  Rate: 93  Rhythm: normal sinus rhythm  QRS Axis: normal  Intervals: normal  ST/T Wave abnormalities: nonspecific ST/T changes  Conduction Disutrbances:none  Narrative Interpretation:   Old EKG Reviewed: none available Today's EKG with septal infarct age undetermined patient clinically without any chest pain do not feel that this is a STEMI related.     1. Embolic cerebral infarction     MDM  Patient will require admission CT scan is consistent  with multiple embolic sig cerebral infarctions it probably been occurring since Saturday. Discussed with neurology they agree with the admission patient will be admitted by hospitalist team. Patient without significant neuro deficits but does have subtle nerve deficits. Patient also has bilateral carotid bruits. Do not have any evidence of arrhythmia or atrial fib or atrial flutter.   Mervin Kung, MD 10/04/12 [phone]

## 2012-10-03 NOTE — ED Notes (Signed)
Pt was sent from [facility] and was told that she has been having mini strokes.  Pt has had numbness/dizziness, trouble holding water, tingling in hands.  Pt has had numbness in right arm that has been there since Saturday..  Pt was treated at [facility] on Sunday for low potassium.  Pt states she had a CT scan on Sunday.  Here to see a neurologist because [facility] does not have one.  Pt states dizziness and confusion for a couple of days

## 2012-10-04 ENCOUNTER — Inpatient Hospital Stay (HOSPITAL_COMMUNITY): Payer: 59

## 2012-10-04 ENCOUNTER — Encounter (HOSPITAL_COMMUNITY): Payer: Self-pay | Admitting: Internal Medicine

## 2012-10-04 DIAGNOSIS — Z8673 Personal history of transient ischemic attack (TIA), and cerebral infarction without residual deficits: Secondary | ICD-10-CM | POA: Diagnosis present

## 2012-10-04 DIAGNOSIS — I6340 Cerebral infarction due to embolism of unspecified cerebral artery: Secondary | ICD-10-CM

## 2012-10-04 DIAGNOSIS — Z720 Tobacco use: Secondary | ICD-10-CM | POA: Diagnosis present

## 2012-10-04 DIAGNOSIS — I6350 Cerebral infarction due to unspecified occlusion or stenosis of unspecified cerebral artery: Secondary | ICD-10-CM

## 2012-10-04 DIAGNOSIS — I6529 Occlusion and stenosis of unspecified carotid artery: Secondary | ICD-10-CM

## 2012-10-04 DIAGNOSIS — E669 Obesity, unspecified: Secondary | ICD-10-CM | POA: Diagnosis present

## 2012-10-04 DIAGNOSIS — F17200 Nicotine dependence, unspecified, uncomplicated: Secondary | ICD-10-CM

## 2012-10-04 DIAGNOSIS — I10 Essential (primary) hypertension: Secondary | ICD-10-CM

## 2012-10-04 LAB — LIPID PANEL
Cholesterol: 205 mg/dL — ABNORMAL HIGH (ref 0–200)
Total CHOL/HDL Ratio: 7.3 RATIO
VLDL: 51 mg/dL — ABNORMAL HIGH (ref 0–40)

## 2012-10-04 LAB — HEMOGLOBIN A1C
Hgb A1c MFr Bld: 6.1 % — ABNORMAL HIGH (ref <5.7)
Mean Plasma Glucose: 128 mg/dL — ABNORMAL HIGH (ref <117)

## 2012-10-04 LAB — CBC
MCV: 85.3 fL (ref 78.0–100.0)
Platelets: 315 10*3/uL (ref 150–400)
RBC: 4.3 MIL/uL (ref 3.87–5.11)
WBC: 10.7 10*3/uL — ABNORMAL HIGH (ref 4.0–10.5)

## 2012-10-04 LAB — CREATININE, SERUM
Creatinine, Ser: 1.21 mg/dL — ABNORMAL HIGH (ref 0.50–1.10)
GFR calc Af Amer: 54 mL/min — ABNORMAL LOW (ref >90)
GFR calc non Af Amer: 47 mL/min — ABNORMAL LOW (ref >90)

## 2012-10-04 LAB — GLUCOSE, CAPILLARY
Glucose-Capillary: 123 mg/dL — ABNORMAL HIGH (ref 70–99)
Glucose-Capillary: 169 mg/dL — ABNORMAL HIGH (ref 70–99)

## 2012-10-04 MED ORDER — DILTIAZEM HCL ER BEADS 120 MG PO CP24
120.0000 mg | ORAL_CAPSULE | Freq: Every day | ORAL | Status: DC
  Administered 2012-10-05: 120 mg via ORAL
  Filled 2012-10-04: qty 1

## 2012-10-04 MED ORDER — ASPIRIN 325 MG PO TABS
325.0000 mg | ORAL_TABLET | Freq: Every day | ORAL | Status: DC
  Filled 2012-10-04: qty 1

## 2012-10-04 MED ORDER — ASPIRIN EC 81 MG PO TBEC
81.0000 mg | DELAYED_RELEASE_TABLET | Freq: Every day | ORAL | Status: DC
  Administered 2012-10-04 – 2012-10-05 (×2): 81 mg via ORAL
  Filled 2012-10-04 (×3): qty 1

## 2012-10-04 MED ORDER — ONDANSETRON HCL 4 MG/2ML IJ SOLN
4.0000 mg | Freq: Four times a day (QID) | INTRAMUSCULAR | Status: DC | PRN
  Administered 2012-10-06 – 2012-10-07 (×2): 4 mg via INTRAVENOUS
  Filled 2012-10-04 (×3): qty 2

## 2012-10-04 MED ORDER — CLONAZEPAM 0.5 MG PO TABS
0.5000 mg | ORAL_TABLET | Freq: Two times a day (BID) | ORAL | Status: DC | PRN
  Administered 2012-10-04 (×2): 0.5 mg via ORAL
  Filled 2012-10-04 (×2): qty 1

## 2012-10-04 MED ORDER — POTASSIUM CHLORIDE CRYS ER 20 MEQ PO TBCR
20.0000 meq | EXTENDED_RELEASE_TABLET | Freq: Every day | ORAL | Status: DC
  Administered 2012-10-04 – 2012-10-09 (×5): 20 meq via ORAL
  Filled 2012-10-04 (×7): qty 1

## 2012-10-04 MED ORDER — SIMVASTATIN 10 MG PO TABS
10.0000 mg | ORAL_TABLET | Freq: Every day | ORAL | Status: DC
  Administered 2012-10-04 – 2012-10-08 (×4): 10 mg via ORAL
  Filled 2012-10-04 (×6): qty 1

## 2012-10-04 MED ORDER — DILTIAZEM HCL ER BEADS 240 MG PO CP24
360.0000 mg | ORAL_CAPSULE | Freq: Every day | ORAL | Status: DC
  Administered 2012-10-04: 360 mg via ORAL
  Filled 2012-10-04: qty 1

## 2012-10-04 MED ORDER — ENOXAPARIN SODIUM 40 MG/0.4ML ~~LOC~~ SOLN
40.0000 mg | SUBCUTANEOUS | Status: DC
  Administered 2012-10-04 – 2012-10-05 (×2): 40 mg via SUBCUTANEOUS
  Filled 2012-10-04 (×3): qty 0.4

## 2012-10-04 MED ORDER — IOHEXOL 350 MG/ML SOLN
50.0000 mL | Freq: Once | INTRAVENOUS | Status: AC | PRN
  Administered 2012-10-04: 50 mL via INTRAVENOUS

## 2012-10-04 MED ORDER — FAMOTIDINE 20 MG PO TABS
20.0000 mg | ORAL_TABLET | Freq: Every day | ORAL | Status: DC
  Administered 2012-10-04 – 2012-10-09 (×5): 20 mg via ORAL
  Filled 2012-10-04 (×7): qty 1

## 2012-10-04 NOTE — Progress Notes (Signed)
VASCULAR LAB PRELIMINARY  PRELIMINARY  PRELIMINARY  PRELIMINARY  Carotid duplex  completed.    Preliminary report:  Right:  Occluded internal carotid artery.  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  Right:  Vertebral artery flow is antegrade.  Left:  Vertebral artery retrograde.     Terrea Bruster, RVT 10/04/2012, 12:36 PM

## 2012-10-04 NOTE — Evaluation (Addendum)
Physical Therapy Evaluation Patient Details Name: Kelli [name] MRN: [id number] DOB: 1951/01/06 Today's Date: 10/04/2012 Time: 1530-1600 PT Time Calculation (min): 30 min  PT Assessment / Plan / Recommendation History of Present Illness  Kelli [name] is an 62 y.o. female, right handed, with a past medical history significant for HTN, smoking, fibromyalgia, sent from [facility] 10/03/2012 for further evaluation. MRI brain showed bi-cerebral strokes.  Clinical Impression  Patient presents with decreased left side awareness, decreased strength, decreased balance, decreased safety with poor deficit awareness, motor planning deficits and question visual deficits.  She will benefit from skilled PT in the acute setting to allow maximized independence prior to d/c home after CIR stay.    PT Assessment  Patient needs continued PT services    Follow Up Recommendations  CIR    Does the patient have the potential to tolerate intense rehabilitation    Yes  Barriers to Discharge  None      Equipment Recommendations  Other (comment) (to be assessed)    Recommendations for Other Services   OT and Speech consult  Frequency Min 4X/week    Precautions / Restrictions Precautions Precautions: Fall Precaution Comments: poor visuospatial awareness   Pertinent Vitals/Pain Denies pain      Mobility  Bed Mobility Bed Mobility: Supine to Sit Supine to Sit: 5: Supervision;HOB flat Details for Bed Mobility Assistance: for safety Transfers Transfers: Sit to Stand;Stand to Sit Sit to Stand: 4: Min guard;From bed Stand to Sit: 4: Min guard;To bed Details for Transfer Assistance: for safety with decreased awareness Ambulation/Gait Ambulation/Gait Assistance: 4: Min assist Ambulation Distance (Feet): 150 Feet Assistive device: None Ambulation/Gait Assistance Details: veers to left with ambulation, needs assist through doorways and around obstacles possibly due to motor planning deficits, vision  not formally tested this date; left arm hanging at side with no intentional movements noted.  Cues and assist needed to wash left hand after toileting. Gait Pattern: Step-through pattern;Trunk rotated posteriorly on left;Shuffle;Decreased stride length Modified Rankin (Stroke Patients Only) Pre-Morbid Rankin Score: No symptoms Modified Rankin: Moderately severe disability        PT Diagnosis: Abnormality of gait;Hemiplegia non-dominant side  PT Problem List: Decreased strength;Decreased balance;Decreased mobility;Decreased coordination;Decreased safety awareness;Impaired sensation;Decreased cognition PT Treatment Interventions: DME instruction;Balance training;Gait training;Neuromuscular re-education;Cognitive remediation;Stair training;Functional mobility training;Patient/family education;Therapeutic activities;Therapeutic exercise     PT Goals(Current goals can be found in the care plan section) Acute Rehab PT Goals Patient Stated Goal: To improve as much as I can as quick as possible PT Goal Formulation: With patient/family Time For Goal Achievement: 10/18/12 Potential to Achieve Goals: Good  Visit Information  Last PT Received On: 10/04/12 Assistance Needed: +1 History of Present Illness: Kelli [name] is an 62 y.o. female, right handed, with a past medical history significant for HTN, smoking, fibromyalgia, sent from [facility] 10/03/2012 for further evaluation. MRI brain showed bi-cerebral strokes.       Prior [place] expects to be discharged to:: Private residence Living Arrangements: Spouse/significant other Type of Home: House Home Access: Stairs to enter [employer] of Steps: 3 Entrance Stairs-Rails: None Home Layout: Two level;Able to live on main level with bedroom/bathroom Alternate Level Stairs-Number of Steps: flight Alternate Level Stairs-Rails: Right Home Equipment: None Prior Function Level of Independence:  Independent Communication Communication: No difficulties Dominant Hand: Right    Cognition  Cognition Arousal/Alertness: Awake/alert Behavior During Therapy: WFL for tasks assessed/performed Overall Cognitive Status: Impaired/Different from baseline Area of Impairment: Safety/judgement;Awareness;Attention Current Attention  Level: Sustained Safety/Judgement: Decreased awareness of deficits Awareness: Intellectual    Extremity/Trunk Assessment Upper Extremity Assessment Upper Extremity Assessment: Defer to OT evaluation Lower Extremity Assessment Lower Extremity Assessment: RLE deficits/detail;LLE deficits/detail RLE Deficits / Details: AROM/strength WFL LLE Deficits / Details: AROM WFL; strength hip flexion 3+/5, knee extension 4/5, ankle DF 4/5 LLE Coordination: decreased gross motor   Balance Balance Balance Assessed: Yes Dynamic Sitting Balance Dynamic Sitting - Balance Support: No upper extremity supported;Feet supported;During functional activity Dynamic Sitting - Level of Assistance: 4: Min assist Dynamic Sitting - Balance Activities: Forward lean/weight shifting Dynamic Sitting - Comments: while donning socks needed assist for safety to prevent loss of balance forward Dynamic Standing Balance Dynamic Standing - Balance Support: No upper extremity supported;During functional activity Dynamic Standing - Level of Assistance: 4: Min assist Dynamic Standing - Comments: washing hands at sink  End of Session PT - End of Session Equipment Utilized During Treatment: Gait belt Activity Tolerance: Patient tolerated treatment well Patient left: in bed;with call bell/phone within reach;with family/visitor present  GP     [facility] 10/04/2012, 5:07 PM Magda Kiel, [place] 10/04/2012

## 2012-10-04 NOTE — Progress Notes (Signed)
Patient admitted after midnight. Discussed with Dr. Leonie Man. He feels infarcts are bilateral watershed in nature. Preliminary carotid Dopplers show Right: Occluded internal carotid artery. Left: Greater than 80% internal carotid artery stenosis. Right: Vertebral artery flow is antegrade. Left: Vertebral artery retrograde. Will consult vascular surgery. Continue aspirin. Start statin. LDL is 126.

## 2012-10-04 NOTE — Progress Notes (Signed)
Echo Lab  2D Echocardiogram completed.  [place], RDCS 10/04/2012 12:36 PM

## 2012-10-04 NOTE — Progress Notes (Signed)
Utilization review completed.  

## 2012-10-04 NOTE — H&P (Signed)
[facility] History and Physical  QUANIECE MCFERRIN [id number] DOB: 26-Apr-1950    PCP:   Manon Hilding, MD   Chief Complaint: right arm paresthesia and dysarthria along with weakness.  HPI: Kelli [name] is an 62 y.o. female with hx of HTN, presented to [facility] ER 4 days ago with sudden onset of right upper extremity paresthesia and heaviness along with some dysarthria.  She was evaluated with a head CT which was negative.  Her symptoms persisted, so she had an MRI of her brain today, which showed mutiple bilateral hemispheric infarts suspicious for embolic phenomenon.  She denied any fever, chills, coughs, or other symptoms of infection.  Upon arrival to the [facility] ER, unknown if she had an MRI earlier today, a head CT was done confirming that she has multiple bilateral hemispheric CVA's.  Neurology was consulted and hospitalist was asked to admit her for finishing her acute bilateral CVAs.  Her EKG showed NSR, and her serology was unremarkable.  She denied chest pain, palpitation, shortness of breath.  She did feel unable to "connect things" and was having intermittent aphasia.  Rewiew of Systems:  Constitutional: Negative for malaise, fever and chills. No significant weight loss or weight gain Eyes: Negative for eye pain, redness and discharge, diplopia, visual changes, or flashes of light. ENMT: Negative for ear pain, hoarseness, nasal congestion, sinus pressure and sore throat. No headaches; tinnitus, drooling, or problem swallowing. Cardiovascular: Negative for chest pain, palpitations, diaphoresis, dyspnea and peripheral edema. ; No orthopnea, PND Respiratory: Negative for cough, hemoptysis, wheezing and stridor. No pleuritic chestpain. Gastrointestinal: Negative for nausea, vomiting, diarrhea, constipation, abdominal pain, melena, blood in stool, hematemesis, jaundice and rectal bleeding.    Genitourinary: Negative for frequency, dysuria, incontinence,flank pain and  hematuria; Musculoskeletal: Negative for back pain and neck pain. Negative for swelling and trauma.;  Skin: . Negative for pruritus, rash, abrasions, bruising and skin lesion.; ulcerations Neuro: Negative for headache and neck stiffness. Negative for weakness, altered level of consciousness , altered mental status, burning feet, involuntary movement, seizure and syncope.  Psych: negative for anxiety, depression, insomnia, tearfulness, panic attacks, hallucinations, paranoia, suicidal or homicidal ideation    Past Medical History  Diagnosis Date  . Hypertension   . Fibromyalgia     Past Surgical History  Procedure Laterality Date  . Abdominal hysterectomy    . Urethral dilation      Medications:  HOME MEDS: Prior to Admission medications   Medication Sig Start Date End Date Taking? Authorizing Provider  clonazePAM (KLONOPIN) 0.5 MG tablet Take 0.5 mg by mouth 2 (two) times daily as needed for anxiety.   Yes Historical Provider, MD  diltiazem (TAZTIA XT) 360 MG 24 hr capsule Take 360 mg by mouth daily.   Yes Historical Provider, MD  meclizine (ANTIVERT) 25 MG tablet Take 25 mg by mouth daily as needed for dizziness.   Yes Historical Provider, MD  potassium chloride SA (K-DUR,KLOR-CON) 20 MEQ tablet Take 20 mEq by mouth daily.   Yes Historical Provider, MD  ranitidine (ZANTAC) 150 MG tablet Take 150 mg by mouth daily as needed for heartburn.   Yes Historical Provider, MD     Allergies:  Allergies  Allergen Reactions  . Hydrochlorothiazide Nausea And Vomiting    Social History:   reports that she has been smoking Cigarettes.  She has a 21 pack-year smoking history. She has never used smokeless tobacco. She reports that she does not drink alcohol or use illicit drugs.  Family History: Family  History  Problem Relation Age of Onset  . Diabetes Mother   . Diabetes Father   . Hypertension Sister   . Diabetes Brother   . Seizures Son   . Kidney disease Maternal Grandmother   .  Hypertension Maternal Grandmother   . Heart disease Maternal Grandfather   . Diabetes Paternal Grandmother   . Heart disease Paternal Grandfather      Physical Exam: Filed Vitals:   10/03/12 1751 10/03/12 1927 10/03/12 2354  BP: 151/105 218/88   Pulse: 96 85   Temp: 98.3 F (36.8 C)  98.1 F (36.7 C)  TempSrc: Oral    Resp: 16    SpO2: 97% 98%    Blood pressure 218/88, pulse 85, temperature 98.1 F (36.7 C), temperature source Oral, resp. rate 16, SpO2 98.00%.  GEN:  Pleasant  patient lying in the stretcher in no acute distress; cooperative with exam. PSYCH:  alert and oriented x4; does not appear anxious or depressed; affect is appropriate. HEENT: Mucous membranes pink and anicteric; PERRLA; EOM intact; no cervical lymphadenopathy nor thyromegaly or carotid bruit; no JVD; There were no stridor. Neck is very supple. Breasts:: Not examined CHEST WALL: No tenderness CHEST: Normal respiration, clear to auscultation bilaterally.  HEART: Regular rate and rhythm.  There is a 2/6 murmur at the LSB.   BACK: No kyphosis or scoliosis; no CVA tenderness ABDOMEN: soft and non-tender; no masses, no organomegaly, normal abdominal bowel sounds; no pannus; no intertriginous candida. There is no rebound and no distention. Rectal Exam: Not done EXTREMITIES: No bone or joint deformity; age-appropriate arthropathy of the hands and knees; no edema; no ulcerations.  There is no calf tenderness. Genitalia: not examined PULSES: 2+ and symmetric SKIN: Normal hydration no rash or ulceration CNS: Cranial nerves 2-12 grossly intact no focal lateralizing neurologic deficit.  Speech is fluent; uvula elevated with phonation, facial symmetry and tongue midline. DTR are normal bilaterally, cerebella exam is intact, barbinski is negative and strengths are equaled bilaterally.  No sensory loss. She has slight word finding problem that was transcient.   Labs on Admission:  Basic Metabolic Panel:  Recent  Labs Lab 10/01/12 1822 10/01/12 2119 10/03/12 1813  NA 134*  --  134*  K 2.6* 3.0* 3.6  CL 96  --  97  CO2 28  --  26  GLUCOSE 170*  --  143*  BUN 19  --  13  CREATININE 1.15*  --  1.14*  CALCIUM 8.8  --  9.5   Liver Function Tests:  Recent Labs Lab 10/01/12 1822 10/03/12 1813  AST 13 16  ALT 15 14  ALKPHOS 82 92  BILITOT 0.2* 0.3  PROT 6.9 7.4  ALBUMIN 3.2* 3.6   No results found for this basename: LIPASE, AMYLASE,  in the last 168 hours No results found for this basename: AMMONIA,  in the last 168 hours CBC:  Recent Labs Lab 10/01/12 1822 10/03/12 1813  WBC 10.8* 12.8*  NEUTROABS 7.2 8.3*  HGB 12.2 14.2  HCT 35.5* 40.1  MCV 85.5 83.9  PLT 339 344   Cardiac Enzymes:  Recent Labs Lab 10/01/12 1822 10/03/12 1815  TROPONINI <0.30 <0.30    CBG:  Recent Labs Lab 10/03/12 1816  GLUCAP 127*     Radiological Exams on Admission: Ct Head Wo Contrast  10/03/2012   *RADIOLOGY REPORT*  Clinical Data: Dizziness.  CT HEAD WITHOUT CONTRAST  Technique:  Contiguous axial images were obtained from the base of the skull through the vertex  without contrast.  Comparison: MRI of the brain 10/03/2012.  Findings: There are multifocal ill-defined areas of decreased attenuation throughout the deep and periventricular white matter of the cerebral hemispheres bilaterally.  While some of these are compatible with chronic microvascular ischemic changes, many are more focal and lower density than typically seen in the setting of chronic microvascular ischemia, and are compatible with evolving acute/subacute infarctions. Similar findings are also found in the subcortical white matter and portions of the cortex bilaterally. No acute intracranial hemorrhage.  No evidence of mass, mass effect, hydrocephalus or abnormal intra or extra-axial fluid collections.  Visualized paranasal sinuses and mastoids are well pneumatized.  No acute displaced skull fractures are noted.  IMPRESSION: 1.  Multi  focal acute/subacute ischemia throughout the cerebral hemispheres bilaterally, similar to recent brain MRI 10/03/2012. Findings are again concerning for multifocal embolic infarcts from potential cardiac source.  Echocardiographic correlation for evidence of intracardiac thrombus or vegetation from infective endocarditis is strongly recommended.  Critical Value/emergent results were called by telephone at the time of interpretation on 10/03/2012 at 08:58 p.m. to Dr. Rogene Houston, who verbally acknowledged these results.   Original Report Authenticated By: Vinnie Langton, M.D.    EKG: Independently reviewed. NSR no acute changes.   Assessment/Plan Present on Admission:  . Acute CVA (cerebrovascular accident) . Hypertension . Tobacco abuse . Obesity  PLAN: Acute bilateral hemispheric CVA suspicious for embolism.  I don't think there is any evidence of infection causing septic embolism.  Will admit to telemetry and start ASA.  Will obtain carotid US, brain MRA without contrast, and ECHO.  She may need to have [name], but will start with TT ECHO.  I have continued with her HTN meds.  She is stable, full code, and will be admitted to [facility] service.  She needs to stop smoking. Thank you for allowing me to participate in the care of this nice patient.  Other plans as per orders.  Code Status: FULL Haskel Khan, MD. [facility] Pager [phone] 7pm to 7am.  10/04/2012, 12:24 AM

## 2012-10-04 NOTE — Consult Note (Signed)
[facility] [place] ADMIT NOTE 10/04/2012 DOB: 11-Nov-1950 MRN : [id number]  [id number] stenosis  History of Present Illness: She presented to the ER with a history of right upper extremity paresthesia and heaviness along with some dysarthria. She was evaluated with a head CT which was negative. Her symptoms persisted, so she had an MRI of her brain today, which showed mutiple bilateral hemispheric infarts suspicious for embolic phenomenon and right occluded internal carotid artery. Preliminary carotid Dopplers show Right: Occluded internal carotid artery. Left: Greater than 80% internal carotid artery stenosis. Right: Vertebral artery flow is antegrade. Left: Vertebral artery retrograde. Contributing medical condition is HTN she is on CCB..       We have been consulted secondary to carotid arteryocclusion/stenosis.        Past Medical History  Diagnosis Date  . Hypertension   . Fibromyalgia   . Stroke   . Cerebral infarction     multiple ischemic; both hemispheres/notes 10/03/2012 (10/04/2012)  . High cholesterol   . Borderline diabetes     "I come from a family of diabetics" (10/04/2012)  . Urinary incontinence     "last few days" (10/04/2012)    Past Surgical History  Procedure Laterality Date  . Urethral dilation  1980's  . Combined hysterectomy vaginal w/ mmk / a&p repair  1981  . Vaginal hysterectomy  1981    "partial" (10/04/2012)    Social History History  Substance Use Topics  . Smoking status: Current Every Day Smoker -- 1.00 packs/day for 44 years    Types: Cigarettes  . Smokeless tobacco: Never Used  . Alcohol Use: No    Family History Family History  Problem Relation Age of Onset  . Diabetes Mother   . Diabetes Father   . Hypertension Sister   . Diabetes Brother   . Seizures Son   . Kidney disease Maternal Grandmother   . Hypertension Maternal Grandmother   . Heart disease Maternal Grandfather   . Diabetes Paternal Grandmother   . Heart  disease Paternal Grandfather     Allergies  Allergen Reactions  . Hydrochlorothiazide Nausea And Vomiting    Current Facility-Administered Medications  Medication Dose Route Frequency Provider Last Rate Last Dose  . 0.9 %  sodium chloride infusion   Intravenous Continuous Mervin Kung, MD      . aspirin EC tablet 81 mg  81 mg Oral Daily Dorian Pod, MD   81 mg at 10/04/12 1101  . clonazePAM (KLONOPIN) tablet 0.5 mg  0.5 mg Oral BID PRN Orvan Falconer, MD   0.5 mg at 10/04/12 0640  . [START ON 10/05/2012] diltiazem (TIAZAC) 24 hr capsule 120 mg  120 mg Oral Daily Delfina Redwood, MD      . enoxaparin (LOVENOX) injection 40 mg  40 mg Subcutaneous Q24H Orvan Falconer, MD   40 mg at 10/04/12 1102  . famotidine (PEPCID) tablet 20 mg  20 mg Oral Daily Orvan Falconer, MD   20 mg at 10/04/12 1100  . ondansetron (ZOFRAN) injection 4 mg  4 mg Intravenous Q6H PRN Delfina Redwood, MD      . potassium chloride SA (K-DUR,KLOR-CON) CR tablet 20 mEq  20 mEq Oral Daily Orvan Falconer, MD   20 mEq at 10/04/12 1100  . simvastatin (ZOCOR) tablet 10 mg  10 mg Oral q1800 Delfina Redwood, MD        ROS: [x]  Positive  [ ]  Denies    General: [ ]  Weight loss, [ ]   Fever, [ ]  chills Neurologic: [x ] Dizziness, [ ]  Blackouts, [ ]  Seizure [ ]  Stroke, [ ]  "Mini stroke", [ ]  Slurred speech, [ ]  Temporary blindness; [x ] weakness in arms or legs ( right upper extremity), [ ]  Hoarseness [ ]  Dysphagia Cardiac: [ ]  Chest pain/pressure, [ ]  Shortness of breath at rest [ ]  Shortness of breath with exertion, [ ]  Atrial fibrillation or irregular heartbeat  Vascular: [ ]  Pain in legs with walking, [ ]  Pain in legs at rest, [ ]  Pain in legs at night,  [ ]  Non-healing ulcer, [ ]  Blood clot in vein/DVT,   Pulmonary: [ ]  Home oxygen, [ ]  Productive cough, [ ]  Coughing up blood, [ ]  Asthma,  [ ]  Wheezing [ ]  COPD Musculoskeletal:  [ ]  Arthritis, [ ]  Low back pain, [ ]  Joint pain Hematologic: [ ]  Easy Bruising, [ ]  Anemia; [ ]   Hepatitis Gastrointestinal: [ ]  Blood in stool, [ ]  Gastroesophageal Reflux/heartburn, Urinary: [ ]  chronic Kidney disease, [ ]  on HD - [ ]  MWF or [ ]  TTHS, [ ]  Burning with urination, [ ]  Difficulty urinating Skin: [ ]  Rashes, [ ]  Wounds Psychological: [ ]  Anxiety, [ ]  Depression  Physical Examination Filed Vitals:   10/04/12 0027 10/04/12 0131 10/04/12 0530 10/04/12 0800  BP:  154/96 143/91 154/89  Pulse:  95 93 88  Temp:  98.8 F (37.1 C) 98.7 F (37.1 C) 98.2 F (36.8 C)  TempSrc:  Oral Oral Oral  Resp:  16  17  Height: 5\' 5"  (1.651 m) 5\' 5"  (1.651 m)    Weight: 197 lb (89.359 kg) 197 lb 6.4 oz (89.54 kg)    SpO2:  97% 100% 93%   Body mass index is 32.85 kg/(m^2).  General:  Sleepy in NAD HENT: WNL Eyes: Pupils equal Pulmonary: normal non-labored breathing , without Rales, rhonchi,  wheezing Cardiac: RRR, without  Murmurs, rubs or gallops; Left carotid bruits Abdomen: soft, NT, no masses Skin: no rashes, ulcers noted Vascular Exam/Pulses: 2+ right radial and 1+ left radial pulse  Extremities without ischemic changes, no Gangrene , no cellulitis; no open wounds;  Musculoskeletal: no muscle wasting or atrophy  Neurologic: A&O X 3; Appropriate Affect ; somewhat lethargic but answers questions appropriately Diminished strength and grip strength in the left arm.    Imaging: Ct Head Wo Contrast  10/03/2012   *RADIOLOGY REPORT*  Clinical Data: Dizziness.  CT HEAD WITHOUT CONTRAST  Technique:  Contiguous axial images were obtained from the base of the skull through the vertex without contrast.  Comparison: MRI of the brain 10/03/2012.  Findings: There are multifocal ill-defined areas of decreased attenuation throughout the deep and periventricular white matter of the cerebral hemispheres bilaterally.  While some of these are compatible with chronic microvascular ischemic changes, many are more focal and lower density than typically seen in the setting of chronic microvascular  ischemia, and are compatible with evolving acute/subacute infarctions. Similar findings are also found in the subcortical white matter and portions of the cortex bilaterally. No acute intracranial hemorrhage.  No evidence of mass, mass effect, hydrocephalus or abnormal intra or extra-axial fluid collections.  Visualized paranasal sinuses and mastoids are well pneumatized.  No acute displaced skull fractures are noted.  IMPRESSION: 1.  Multi focal acute/subacute ischemia throughout the cerebral hemispheres bilaterally, similar to recent brain MRI 10/03/2012. Findings are again concerning for multifocal embolic infarcts from potential cardiac source.  Echocardiographic correlation for evidence of intracardiac thrombus or vegetation from  infective endocarditis is strongly recommended.  Critical Value/emergent results were called by telephone at the time of interpretation on 10/03/2012 at 08:58 p.m. to Dr. Rogene Houston, who verbally acknowledged these results.   Original Report Authenticated By: Vinnie Langton, M.D.   Mr [facility] Wo Contrast  10/04/2012   *RADIOLOGY REPORT*  Clinical Data: Stroke  MRA HEAD WITHOUT CONTRAST  Technique: Angiographic images of the Circle of Willis were obtained using MRA technique without intravenous contrast.  Comparison: MRI 10/03/2012  Findings: Both vertebral arteries are patent to the basilar, the right vertebral artery is dominant.  PICA not visualized.  AICA patent bilaterally.  Superior cerebellar arteries are patent bilaterally.  Right posterior cerebral artery is patent with mild irregularity distally.  There is a moderate stenosis of the proximal left posterior cerebral artery which is patent distally.  The right internal carotid artery is occluded.  The right anterior and middle cerebral arteries are supplied from the anterior communicating artery and have decreased signal due to low flow. Left posterior communicating artery is patent.  Right posterior communicating artery is  not visualized.  There is probable irregularity in the right middle cerebral artery branches which could be thrombus or atherosclerotic disease.  The left middle cerebral artery is patent without significant with mild irregularity.  Both anterior cerebral arteries are patent.  Negative for aneurysm.  Image quality degraded by patient motion.  IMPRESSION: Occluded right internal carotid artery.  The right anterior and middle cerebral arteries are supplied by the anterior communicating artery.  There is probable atherosclerotic disease in the middle cerebral arteries bilaterally.  There is a moderate stenosis of the proximal left posterior cerebral artery.   Original Report Authenticated By: Carl Best, M.D.    Significant Diagnostic Studies: CBC Lab Results  Component Value Date   WBC 10.7* 10/04/2012   HGB 12.5 10/04/2012   HCT 36.7 10/04/2012   MCV 85.3 10/04/2012   PLT 315 10/04/2012    BMET    Component Value Date/Time   NA 134* 10/03/2012 1813   K 3.6 10/03/2012 1813   CL 97 10/03/2012 1813   CO2 26 10/03/2012 1813   GLUCOSE 143* 10/03/2012 1813   BUN 13 10/03/2012 1813   CREATININE 1.21* 10/04/2012 0520   CALCIUM 9.5 10/03/2012 1813   GFRNONAA 47* 10/04/2012 0520   GFRAA 54* 10/04/2012 0520   Estimated Creatinine Clearance: 53.3 ml/min (by C-G formula based on Cr of 1.21).  COAG Lab Results  Component Value Date   INR 0.94 10/03/2012   No results found for this basename: PTT     Non-Invasive Vascular Imaging: Right Internal carotid occlusion, high-grade left internal carotid artery stenosis. Retrograde flow in the left vertebral artery  ASSESSMENT/PLAN: Complex patient with bi-cerebral strokes. Multiple occlusions in extracranial cerebrovascular circulation. Intracranial flow was noted on MRA. Discuss this at length with the patient and multiple family members present. Explained that my roll is to determine options for reducing risk of recurrent stroke. I explained that to restoration of flow not  improve her stroke rehabilitation. 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

## 2012-10-04 NOTE — Progress Notes (Signed)
Stroke Team Progress Note  HISTORY Kelli [name] is an 62 y.o. female, right handed, with a past medical history significant for HTN, smoking, fibromyalgia, sent from [facility] 10/03/2012 for further evaluation. MRI brain showed bi-cerebral strokes.    On 8/2 she started noticing some unusual fatigue associated with right arm numbness, poor balance, disorientation, confused to the point that she was not able to connect things, blurred vision, as well as a sensation of heaviness both arms. Never had similar symptoms before and decided to go to to [facility] ED the next day where she had a CT brain that showed no acute intracranial abnormality. She was discharged from the ED 8/3.  Her symptoms persisted, she presented to [facility] where an MRI revealed multiple ischemic infarcts in both cerebral hemispheres.she was transferred to [facility] where a CT brain was performed upon arrival to [facility] ED which disclosed multi focal acute/subacute ischemia throughout the cerebral hemispheres bilaterally, similar to recent brain MRI. She denies headache, vertigo, double vision, difficulty swallowing, slurred speech, language or vision impairment. No recent fever, infection, head or neck trauma.   Patient was not a TPA candidate secondary to delay in arrival from symptom onset. She was admitted for further evaluation and treatment.  SUBJECTIVE Her son Lysbeth Galas is at the bedside.  Overall she feels her condition is controlled, no worse no better.   OBJECTIVE Most recent Vital Signs: Filed Vitals:   10/04/12 0027 10/04/12 0131 10/04/12 0530 10/04/12 0800  BP:  154/96 143/91 154/89  Pulse:  95 93 88  Temp:  98.8 F (37.1 C) 98.7 F (37.1 C) 98.2 F (36.8 C)  TempSrc:  Oral Oral Oral  Resp:  16  17  Height: 5\' 5"  (1.651 m) 5\' 5"  (1.651 m)    Weight: 89.359 kg (197 lb) 89.54 kg (197 lb 6.4 oz)    SpO2:  97% 100% 93%   CBG (last 3)   Recent Labs  10/03/12 1816 10/04/12 0139  GLUCAP 127* 137*    IV Fluid  Intake:   . sodium chloride      MEDICATIONS  . aspirin EC  81 mg Oral Daily  . diltiazem  360 mg Oral Daily  . enoxaparin (LOVENOX) injection  40 mg Subcutaneous Q24H  . famotidine  20 mg Oral Daily  . potassium chloride SA  20 mEq Oral Daily   PRN:  clonazePAM  Diet:  NPO  Activity:  Bathroom privileges with assistance DVT Prophylaxis:  Lovenox 40 mg sq daily   CLINICALLY SIGNIFICANT STUDIES Basic Metabolic Panel:  Recent Labs Lab 10/01/12 1822 10/01/12 2119 10/03/12 1813 10/04/12 0520  NA 134*  --  134*  --   K 2.6* 3.0* 3.6  --   CL 96  --  97  --   CO2 28  --  26  --   GLUCOSE 170*  --  143*  --   BUN 19  --  13  --   CREATININE 1.15*  --  1.14* 1.21*  CALCIUM 8.8  --  9.5  --    Liver Function Tests:  Recent Labs Lab 10/01/12 1822 10/03/12 1813  AST 13 16  ALT 15 14  ALKPHOS 82 92  BILITOT 0.2* 0.3  PROT 6.9 7.4  ALBUMIN 3.2* 3.6   CBC:  Recent Labs Lab 10/01/12 1822 10/03/12 1813 10/04/12 0520  WBC 10.8* 12.8* 10.7*  NEUTROABS 7.2 8.3*  --   HGB 12.2 14.2 12.5  HCT 35.5* 40.1 36.7  MCV 85.5  83.9 85.3  PLT 339 344 315   Coagulation:  Recent Labs Lab 10/03/12 1813  LABPROT 12.4  INR 0.94   Cardiac Enzymes:  Recent Labs Lab 10/01/12 1822 10/03/12 1815  TROPONINI <0.30 <0.30   Urinalysis:  Recent Labs Lab 10/01/12 1829  COLORURINE YELLOW  LABSPEC 1.025  PHURINE 7.0  GLUCOSEU NEGATIVE  HGBUR SMALL*  BILIRUBINUR NEGATIVE  KETONESUR NEGATIVE  PROTEINUR >300*  UROBILINOGEN 0.2  NITRITE NEGATIVE  LEUKOCYTESUR NEGATIVE   Lipid Panel    Component Value Date/Time   CHOL 205* 10/04/2012 0520   TRIG 257* 10/04/2012 0520   HDL 28* 10/04/2012 0520   CHOLHDL 7.3 10/04/2012 0520   VLDL 51* 10/04/2012 0520   LDLCALC 126* 10/04/2012 0520   HgbA1C  No results found for this basename: HGBA1C    Urine Drug Screen:   No results found for this basename: labopia, cocainscrnur, labbenz, amphetmu, thcu, labbarb    Alcohol Level: No results found  for this basename: ETH,  in the last 168 hours  CT of the brain   10/03/2012  1.  Multi focal acute/subacute ischemia throughout the cerebral hemispheres bilaterally, similar to recent brain MRI 10/03/2012.  10/01/2012 (at [facility]) Chronic microvascular ischemic changes in the white matter. No acute process by noncontrast CT.   MRI of the brain  (done at [facility]) Bilateral watershed infarcts  MRA of the brain  10/04/2012  Occluded right internal carotid artery.  The right anterior and middle cerebral arteries are supplied by the anterior communicating artery.  There is probable atherosclerotic disease in the middle cerebral arteries bilaterally.  There is a moderate stenosis of the proximal left posterior cerebral artery.   2D Echocardiogram  EF 55-60% with no source of embolus.   Carotid Doppler    CXR    EKG  normal sinus rhythm.   Therapy Recommendations   Physical Exam   Pleasant middle-aged Caucasian lady currently not in distress.Awake alert. Afebrile. Head is nontraumatic. Neck is supple without bruit. Hearing is normal. Cardiac exam no murmur or gallop. Lungs are clear to auscultation. Distal pulses are well felt. Neurological Exam :  Awake alert oriented x3 with normal speech and language function. Vision acuity and fields appear normal. Fundi were not visualized.   mild left lower facial weakness. Tongue is midline. Motor system exam reveals left upper extremity drift and weakness in the left grip and intrinsic hand muscles. Orbits right over left upper extremity. Symmetric lower extremity strength. Sensation is intact. Gait was not tested the ASSESSMENT Kelli [name] is a 62 y.o. female presenting with unsteadiness, right upper extremity numbness, heaviness bilateral UE, disorientation. Imaging confirms a bilateral watershed infarcts. Infarcts felt to be thromboembolic secondary to carotid stenosis - MRI suggests severe occlusion/stenosis of right ICA. Suspect similar/less severe  disease left ICA. Carotid dopplers pending.  On no antithrombotic prior to admission. Now on aspirin 81 mg orally every day for secondary stroke prevention. Work up underway.  Hypertension Hyperlipidemia, LDL 126, on no statin PTA, now on no statin, goal LDL < 100  Fibromyalgia Cigarette smoker  Hospital day # 1  TREATMENT/PLAN  Continue  aspirin 81 mg orally every day for secondary stroke prevention.  F/u carotid dopplers if confirms carotid stenosis will need vascular surgery referral  Add statin  [name] not indicated - cardioembolic souce not suspected. Informed RN. She will resume diet.  Burnetta Sabin, MSN, RN, ANVP-BC, ANP-BC, GNP-BC [facility] Pager: [phone] 10/04/2012 10:08 AM  I have  personally obtained a history, examined the patient, evaluated imaging results, and formulated the assessment and plan of care. I agree with the above. Antony Contras, MD

## 2012-10-04 NOTE — ED Notes (Signed)
Pt had short period after passing swallow screen that she could not get the words out that she was trying to say.  Only last approx 1 min.  Dr. Marin Comment in room and made aware.  Pt remained alert and oriented x's 3, skin warm and dry color appropriate.

## 2012-10-04 NOTE — Discharge Instructions (Signed)
STROKE/TIA DISCHARGE INSTRUCTIONS SMOKING Cigarette smoking nearly doubles your risk of having a stroke & is the single most alterable risk factor  If you smoke or have smoked in the last 12 months, you are advised to quit smoking for your health.  Most of the excess cardiovascular risk related to smoking disappears within a year of stopping.  Ask you doctor about anti-smoking medications  [place] Quit Line: 1-800-QUIT NOW  Free Smoking Cessation Classes [phone])  CHOLESTEROL Know your levels; limit fat & cholesterol in your diet  Lipid Panel     Component Value Date/Time   CHOL 205* 10/04/2012 0520   TRIG 257* 10/04/2012 0520   HDL 28* 10/04/2012 0520   CHOLHDL 7.3 10/04/2012 0520   VLDL 51* 10/04/2012 0520   LDLCALC 126* 10/04/2012 0520      Many patients benefit from treatment even if their cholesterol is at goal.  Goal: Total Cholesterol (CHOL) less than 160  Goal:  Triglycerides (TRIG) less than 150  Goal:  HDL greater than 40  Goal:  LDL (LDLCALC) less than 100   BLOOD PRESSURE American Stroke Association blood pressure target is less that 120/80 mm/Hg  Your discharge blood pressure is:  BP: 154/89 mmHg  Monitor your blood pressure  Limit your salt and alcohol intake  Many individuals will require more than one medication for high blood pressure  DIABETES (A1c is a blood sugar average for last 3 months) Goal HGBA1c is under 7% (HBGA1c is blood sugar average for last 3 months)  Diabetes: {STROKE DC DIABETES:22357}    No results found for this basename: HGBA1C     Your HGBA1c can be lowered with medications, healthy diet, and exercise.  Check your blood sugar as directed by your physician  Call your physician if you experience unexplained or low blood sugars.  PHYSICAL ACTIVITY/REHABILITATION Goal is 30 minutes at least 4 days per week    {STROKE DC ACTIVITY/REHAB:22359}  Activity decreases your risk of heart attack and stroke and makes your heart stronger.  It helps  control your weight and blood pressure; helps you relax and can improve your mood.  Participate in a regular exercise program.  Talk with your doctor about the best form of exercise for you (dancing, walking, swimming, cycling).  DIET/WEIGHT Goal is to maintain a healthy weight  Your discharge diet is: NPO *** liquids Your height is:  Height: 5\' 5"  (165.1 cm) Your current weight is: Weight: 89.54 kg (197 lb 6.4 oz) Your Body Mass Index (BMI) is:  BMI (Calculated): 32.9  Following the type of diet specifically designed for you will help prevent another stroke.  Your goal weight range is:  ***  Your goal Body Mass Index (BMI) is 19-24.  Healthy food habits can help reduce 3 risk factors for stroke:  High cholesterol, hypertension, and excess weight.  RESOURCES Stroke/Support Group:  Call [phone]  they meet the 3rd Sunday of the month on the Rehab Unit at [facility], [place] ( no meetings June, July & Aug).  STROKE EDUCATION PROVIDED/REVIEWED AND GIVEN TO PATIENT Stroke warning signs and symptoms How to activate emergency medical system (call 911). Medications prescribed at discharge. Need for follow-up after discharge. Personal risk factors for stroke. Pneumonia vaccine given:   {STROKE DC YES/NO/DATE:22363} Flu vaccine given:   {STROKE DC YES/NO/DATE:22363} My questions have been answered, the writing is legible, and I understand these instructions.  I will adhere to these goals & educational materials that have been provided to me after my  discharge from the hospital.

## 2012-10-05 DIAGNOSIS — R7309 Other abnormal glucose: Secondary | ICD-10-CM

## 2012-10-05 DIAGNOSIS — G8110 Spastic hemiplegia affecting unspecified side: Secondary | ICD-10-CM

## 2012-10-05 DIAGNOSIS — N289 Disorder of kidney and ureter, unspecified: Secondary | ICD-10-CM

## 2012-10-05 DIAGNOSIS — I6330 Cerebral infarction due to thrombosis of unspecified cerebral artery: Secondary | ICD-10-CM

## 2012-10-05 DIAGNOSIS — I63239 Cerebral infarction due to unspecified occlusion or stenosis of unspecified carotid arteries: Secondary | ICD-10-CM | POA: Insufficient documentation

## 2012-10-05 DIAGNOSIS — R739 Hyperglycemia, unspecified: Secondary | ICD-10-CM | POA: Diagnosis present

## 2012-10-05 DIAGNOSIS — E785 Hyperlipidemia, unspecified: Secondary | ICD-10-CM

## 2012-10-05 DIAGNOSIS — E782 Mixed hyperlipidemia: Secondary | ICD-10-CM | POA: Diagnosis present

## 2012-10-05 LAB — GLUCOSE, CAPILLARY
Glucose-Capillary: 151 mg/dL — ABNORMAL HIGH (ref 70–99)
Glucose-Capillary: 115 mg/dL — ABNORMAL HIGH (ref 70–99)

## 2012-10-05 LAB — RAPID URINE DRUG SCREEN, HOSP PERFORMED
Barbiturates: NOT DETECTED
Cocaine: NOT DETECTED

## 2012-10-05 LAB — LIPID PANEL
HDL: 30 mg/dL — ABNORMAL LOW (ref >39)
Triglycerides: 302 mg/dL — ABNORMAL HIGH (ref <150)

## 2012-10-05 MED ORDER — SODIUM CHLORIDE 0.9 % IV SOLN: INTRAVENOUS | Status: DC

## 2012-10-05 MED ORDER — DEXTROSE 5 % IV SOLN
1.5000 g | INTRAVENOUS | Status: AC
  Administered 2012-10-06: 1.5 g via INTRAVENOUS
  Filled 2012-10-05: qty 1.5

## 2012-10-05 MED ORDER — LABETALOL HCL 5 MG/ML IV SOLN
10.0000 mg | INTRAVENOUS | Status: DC | PRN
  Administered 2012-10-06 – 2012-10-09 (×10): 10 mg via INTRAVENOUS
  Filled 2012-10-05 (×7): qty 4

## 2012-10-05 MED ORDER — DILTIAZEM HCL ER COATED BEADS 180 MG PO CP24
180.0000 mg | ORAL_CAPSULE | Freq: Every day | ORAL | Status: DC
  Administered 2012-10-07: 180 mg via ORAL
  Filled 2012-10-05 (×2): qty 1

## 2012-10-05 NOTE — Progress Notes (Addendum)
Patient ID: AHZARIA HOLIFIELD, female   DOB: 27-Aug-1950, 62 y.o.   MRN: [id number] The patient had a very restless night. She reports that the bed arm continue to go off with very little sleep. She reports that she did have some urinary incontinence and had some confusion. She has her at her same neuro level as well as saw her yesterday evening. Does have some apparent focal weakness on the left side.  Hemodynamically she remained stable  I did review her CT scan from last night. The official report is not available as of yet. This does show right internal carotid artery occlusion. She has trickle flow through a subtotal occlusion of her left internal carotid artery. The artery distal to this segment is widely patent going into her brain. She does have a left subclavian and proximal occlusion with a very small vertebral artery that was shown to have retrograde flow on duplex. She does have a widely patent dominant right vertebral artery.  Impression and plan: Critical ischemia to her brain. She has occluded right internal and trickle flow in her left internal carotid she has retrograde flow in her left vertebral artery. I have recommended left carotid endarterectomy tomorrow. I did explain the possibility of worsening stroke with reperfusion. Am quite concerned however that she has a bilateral watershed infarcts with a minimal flow to her brain. Discussed the procedure at length with her. Her husband is not present in the room this morning I have provided my pager #[phone] talk with him later on today to explain this. We would plan on surgery as a second case tomorrow approximately 9 AM

## 2012-10-05 NOTE — Progress Notes (Signed)
Physical Therapy Treatment Patient Details Name: Kelli [name] MRN: [id number] DOB: 04/09/1950 Today's Date: 10/05/2012 Time: [id number] PT Time Calculation (min): 24 min  PT Assessment / Plan / Recommendation  History of Present Illness Kelli [name] is an 62 y.o. female, right handed, with a past medical history significant for HTN, smoking, fibromyalgia, sent from [facility] 10/03/2012 for further evaluation. MRI brain showed bi-cerebral strokes.   PT Comments   Patient c/o bilat UE weakness today and felt unsafe using walker.  Some improvement with maneuvering in room with cues to notice obstacle and to plan a path.  Still bumped into wall x 1 with fatigue once back in room. Noted slight improvement in attention with able to focus during gait in hallway on balance (with cues) and look back at grandaughter without balance loss (but again worse with fatigue). Will benefit from CIR level rehab at d/c.  Noted CEA planned for tomorrow.  Follow Up Recommendations  CIR     Does the patient have the potential to tolerate intense rehabilitation   Yes  Barriers to Discharge  None      Equipment Recommendations  Other (comment) (TBA)    Recommendations for Other Services Rehab consult  Frequency Min 4X/week   Progress towards PT Goals Progress towards PT goals: Progressing toward goals  Plan Current plan remains appropriate    Precautions / Restrictions Precautions Precautions: Fall Precaution Comments: poor visuospatial awareness   Pertinent Vitals/Pain Denies pain    Mobility  Bed Mobility Bed Mobility: Sit to Supine Supine to Sit: 4: Min assist;HOB elevated;With rails Sit to Supine: 4: Min guard Details for Bed Mobility Assistance: cues for technique  Transfers Sit to Stand: 5: Supervision;From bed Stand to Sit: 5: Supervision;To bed Details for Transfer Assistance: cues for positioning near head of bed Ambulation/Gait Ambulation/Gait Assistance: 4: Min assist;3: Mod  assist Ambulation Distance (Feet): 160 Feet Assistive device: Rolling walker;1 person hand held assist Ambulation/Gait Assistance Details: Started with walker and pt c/o feeling it was getting away from her and had to guide walker; then with HHA on left pt reported felt better, but noted still kept left side retracted, able to maneuver around obstacles in room with verbal cues; noted once back in room pt began to fall to right leaning into wall until cued to look straight ahead and fix her balance Gait Pattern: Step-through pattern;Wide base of support;Trunk rotated posteriorly on left;Decreased stride length Modified Rankin (Stroke Patients Only) Modified Rankin: Moderately severe disability    Exercises General Exercises - Lower Extremity Hip ABduction/ADduction: AROM;Both;10 reps;Standing (alternating) Hip Flexion/Marching: AROM;Both;10 reps;Standing Mini-Sqauts: AROM;Both;10 reps;Standing      PT Goals (current goals can now be found in the care plan section)    Visit Information  Last PT Received On: 10/05/12 History of Present Illness: Kelli [name] is an 62 y.o. female, right handed, with a past medical history significant for HTN, smoking, fibromyalgia, sent from [facility] 10/03/2012 for further evaluation. MRI brain showed bi-cerebral strokes.    Subjective Data   My surgery will be tomorrow.   Cognition  Cognition Arousal/Alertness: Awake/alert Behavior During Therapy: WFL for tasks assessed/performed Overall Cognitive Status: Impaired/Different from baseline Area of Impairment: Safety/judgement;Awareness;Attention Current Attention Level: Selective Safety/Judgement: Decreased awareness of deficits Awareness: Intellectual    Balance   Min-Mod assist at times for safety with static standing balance with cues   End of Session PT - End of Session Equipment Utilized During Treatment: Gait belt Activity Tolerance: Patient limited by  fatigue Patient left: in bed;with  family/visitor present   GP     [facility] 10/05/2012, 4:42 PM Magda Kiel, [place] 10/05/2012

## 2012-10-05 NOTE — CV Procedure (Signed)
Physical Medicine and Rehabilitation Consult Reason for Consult: CVA Referring Physician: [facility]   HPI: Kelli [name] is a 62 y.o. right-handed female with history of hypertension, tobacco abuse, fibromyalgia. Patient independent prior to admission. Admitted to [facility] 10/03/2012 with right sided weakness and altered mental status. Cranial CT scan showed multifocal acute and subacute ischemia throughout the cerebral hemispheres bilaterally as compared to prior scanning from MRI outside hospital 10/03/2012. Patient did not receive TPA. 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 Echocardiogram with ejection fraction of 60% without embolus. 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 Vascular surgery consulted and plan left carotid endarterectomy. Patient is presently maintained on aspirin therapy for CVA prophylaxis as well as subcutaneous Lovenox for DVT prophylaxis. She is tolerating a regular consistency diet. Physical therapy evaluation completed 10/04/2012 with recommendations for physical medicine rehabilitation consult to consider inpatient rehabilitation services.  The patient is scheduled for left carotid endarterectomy in the morning Patient feels that she has fluctuating symptoms and feels like her arms are difficult to coordinate at the current time  Review of Systems  Respiratory: Positive for cough.   Musculoskeletal: Positive for myalgias and joint pain.  Psychiatric/Behavioral:       Anxiety  All other systems reviewed and are negative.   Past Medical History  Diagnosis Date  . Hypertension   . Fibromyalgia   . Stroke   . Cerebral infarction     multiple ischemic; both hemispheres/notes 10/03/2012 (10/04/2012)  . High cholesterol   . Borderline diabetes     "I come from a family of diabetics" (10/04/2012)  . Urinary incontinence     "last few days" (10/04/2012)   Past Surgical History   Procedure Laterality Date  . Urethral dilation  1980's  . Combined hysterectomy vaginal w/ mmk / a&p repair  1981  . Vaginal hysterectomy  1981    "partial" (10/04/2012)   Family History  Problem Relation Age of Onset  . Diabetes Mother   . Diabetes Father   . Hypertension Sister   . Diabetes Brother   . Seizures Son   . Kidney disease Maternal Grandmother   . Hypertension Maternal Grandmother   . Heart disease Maternal Grandfather   . Diabetes Paternal Grandmother   . Heart disease Paternal Grandfather    Social History:  reports that she has been smoking Cigarettes.  She has a 44 pack-year smoking history. She has never used smokeless tobacco. She reports that she does not drink alcohol or use illicit drugs. Allergies:  Allergies  Allergen Reactions  . Hydrochlorothiazide Nausea And Vomiting   Medications Prior to Admission  Medication Sig Dispense Refill  . clonazePAM (KLONOPIN) 0.5 MG tablet Take 0.5 mg by mouth 2 (two) times daily as needed for anxiety.      Marland Kitchen diltiazem (TAZTIA XT) 360 MG 24 hr capsule Take 360 mg by mouth daily.      . meclizine (ANTIVERT) 25 MG tablet Take 25 mg by mouth daily as needed for dizziness.      . potassium chloride SA (K-DUR,KLOR-CON) 20 MEQ tablet Take 20 mEq by mouth daily.      . ranitidine (ZANTAC) 150 MG tablet Take 150 mg by mouth daily as needed for heartburn.        Home: Home Living Family/patient expects to be discharged to:: Private residence Living Arrangements: Spouse/significant other Type of Home: House Home Access: Stairs to enter [employer] of  Steps: 3 Entrance Stairs-Rails: None Home Layout: Two level;Able to live on main level with bedroom/bathroom Alternate Level Stairs-Number of Steps: flight Alternate Level Stairs-Rails: Right Home Equipment: None  Functional History:   Functional Status:  Mobility: Bed Mobility Bed Mobility: Supine to Sit Supine to Sit: 5: Supervision;HOB  flat Transfers Transfers: Sit to Stand;Stand to Sit Sit to Stand: 4: Min guard;From bed Stand to Sit: 4: Min guard;To bed Ambulation/Gait Ambulation/Gait Assistance: 4: Min assist Ambulation Distance (Feet): 150 Feet Assistive device: None Ambulation/Gait Assistance Details: veers to left with ambulation, needs assist through doorways and around obstacles possibly due to motor planning deficits, vision not formally tested this date; left arm hanging at side with no intentional movements noted.  Cues and assist needed to wash left hand after toileting. Gait Pattern: Step-through pattern;Trunk rotated posteriorly on left;Shuffle;Decreased stride length    ADL:    Cognition: Cognition Overall Cognitive Status: Impaired/Different from baseline Orientation Level: Oriented to person;Disoriented to place;Disoriented to time;Disoriented to situation Cognition Arousal/Alertness: Awake/alert Behavior During Therapy: WFL for tasks assessed/performed Overall Cognitive Status: Impaired/Different from baseline Area of Impairment: Safety/judgement;Awareness;Attention Current Attention Level: Sustained Safety/Judgement: Decreased awareness of deficits Awareness: Intellectual  Blood pressure 221/72, pulse 79, temperature 98.2 F (36.8 C), temperature source Oral, resp. rate 17, height 5\' 5"  (1.651 m), weight 89.54 kg (197 lb 6.4 oz), SpO2 97.00%. Physical Exam  Vitals reviewed. Constitutional: She is oriented to person, place, and time.  HENT:  Head: Normocephalic.  Eyes: EOM are normal.  Neck: Normal range of motion. Neck supple. No thyromegaly present.  Cardiovascular: Normal rate and regular rhythm.   Pulmonary/Chest: Effort normal and breath sounds normal. No respiratory distress.  Abdominal: Soft. Bowel sounds are normal. She exhibits no distension.  Neurological: She is alert and oriented to person, place, and time.  Follows full commands. Decreased fine motor skills on the right   Skin: Skin is warm.   motor strength 5/5 in the right deltoid, bicep, tricep, grip, hip flexor, knee extensors, ankle dorsiflexor plantar flexor 4/5 left deltoid, bicep, tricep, grip, hip flexor, knee extensors, ankle dorsiflexor and plantar flexor Cerebellar has past pointing with bilateral finger nose to finger. Extraocular muscles are intact no evidence of nystagmus Sitting balance is good Sensation intact to light touch in both upper and lower extremities   Results for orders placed during the hospital encounter of 10/03/12 (from the past 24 hour(s))  GLUCOSE, CAPILLARY     Status: Abnormal   Collection Time    10/04/12 11:55 AM      Result Value Range   Glucose-Capillary 123 (*) 70 - 99 mg/dL  GLUCOSE, CAPILLARY     Status: Abnormal   Collection Time    10/04/12  5:28 PM      Result Value Range   Glucose-Capillary 169 (*) 70 - 99 mg/dL  GLUCOSE, CAPILLARY     Status: Abnormal   Collection Time    10/04/12  9:03 PM      Result Value Range   Glucose-Capillary 129 (*) 70 - 99 mg/dL  URINE RAPID DRUG SCREEN (HOSP PERFORMED)     Status: None   Collection Time    10/05/12 12:11 AM      Result Value Range   Opiates NONE DETECTED  NONE DETECTED   Cocaine NONE DETECTED  NONE DETECTED   Benzodiazepines NONE DETECTED  NONE DETECTED   Amphetamines NONE DETECTED  NONE DETECTED   Tetrahydrocannabinol NONE DETECTED  NONE DETECTED   Barbiturates NONE DETECTED  NONE  DETECTED  LIPID PANEL     Status: Abnormal   Collection Time    10/05/12 12:27 AM      Result Value Range   Cholesterol 211 (*) 0 - 200 mg/dL   Triglycerides 302 (*) <150 mg/dL   HDL 30 (*) >39 mg/dL   Total CHOL/HDL Ratio 7.0     VLDL 60 (*) 0 - 40 mg/dL   LDL Cholesterol 121 (*) 0 - 99 mg/dL  GLUCOSE, CAPILLARY     Status: Abnormal   Collection Time    10/05/12  8:10 AM      Result Value Range   Glucose-Capillary 151 (*) 70 - 99 mg/dL   Ct Head Wo Contrast  10/03/2012   *RADIOLOGY REPORT*  Clinical Data:  Dizziness.  CT HEAD WITHOUT CONTRAST  Technique:  Contiguous axial images were obtained from the base of the skull through the vertex without contrast.  Comparison: MRI of the brain 10/03/2012.  Findings: There are multifocal ill-defined areas of decreased attenuation throughout the deep and periventricular white matter of the cerebral hemispheres bilaterally.  While some of these are compatible with chronic microvascular ischemic changes, many are more focal and lower density than typically seen in the setting of chronic microvascular ischemia, and are compatible with evolving acute/subacute infarctions. Similar findings are also found in the subcortical white matter and portions of the cortex bilaterally. No acute intracranial hemorrhage.  No evidence of mass, mass effect, hydrocephalus or abnormal intra or extra-axial fluid collections.  Visualized paranasal sinuses and mastoids are well pneumatized.  No acute displaced skull fractures are noted.  IMPRESSION: 1.  Multi focal acute/subacute ischemia throughout the cerebral hemispheres bilaterally, similar to recent brain MRI 10/03/2012. Findings are again concerning for multifocal embolic infarcts from potential cardiac source.  Echocardiographic correlation for evidence of intracardiac thrombus or vegetation from infective endocarditis is strongly recommended.  Critical Value/emergent results were called by telephone at the time of interpretation on 10/03/2012 at 08:58 p.m. to Dr. Rogene Houston, who verbally acknowledged these results.   Original Report Authenticated By: Vinnie Langton, M.D.   Ct Angio Neck W/cm &/or Wo/cm  10/05/2012   *RADIOLOGY REPORT*  Clinical Data:  Left-sided weakness.  CVA.  CT ANGIOGRAPHY NECK  Technique:  Multidetector CT imaging of the neck was performed using the standard protocol during bolus administration of intravenous contrast.  Multiplanar CT image reconstructions including MIPs were obtained to evaluate the vascular anatomy.  Carotid stenosis measurements (when applicable) are obtained utilizing NASCET criteria, using the distal internal carotid diameter as the denominator.  Contrast: 58mL OMNIPAQUE IOHEXOL 350 MG/ML SOLN  Comparison:   MRI brain 10/03/2012.  MRA head 10/04/2012.  Findings:  There is a combined origin of the left common carotid artery and the innominate artery.  Extensive atherosclerotic irregularity is present throughout the aortic arch.  This is predominately soft plaque with ulcerations. There is soft tissue plaque along the anterior margin of the ascending innominate artery.  The left subclavian artery is occluded 12 mm after its origin. There is flow in the left vertebral artery which may be reversed. The left subclavian artery is opacified.  The right vertebral artery is the dominant vessel.  There is mild tortuosity proximally without significant stenosis.  Additional tortuosity is present at the C2-3 level on the right without significant stenosis.  The right vertebral artery is dominant throughout the neck.  Vessel size is diminished to below the level of C6 on the left.  The left subclavian artery is reconstituted by  both the left vertebral artery and the high cervical artery.  The right common carotid artery is within normal limits.  The right internal carotid artery is occluded at its origin.  There is a high- grade stenosis of the proximal right external carotid artery.  Atherosclerotic calcifications are present at the origin of the left common carotid artery without a significant stenosis.  A high- grade, near occlusive stenosis is present in the proximal left internal carotid artery essentially a string sign extending for 13 mm.  The more distal left internal carotid is smaller than expected in caliber at 3.5 mm.  There is minimal flow within the petrous right internal carotid artery, but no flow below the skull base.  The transverse sinuses are opacified.  Limited imaging of the brain is unremarkable.    Review of the MIP images confirms the above findings.  IMPRESSION:  1.  Occluded right internal carotid artery with slight reconstitution at the petrous segment. 2.  High-grade, near occlusive proximal left internal carotid artery stenosis. 3.  Occluded left subclavian artery. 4.  Normal appearance of the right vertebral artery.  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 5.  Decreased vessel diameter of the left vertebral artery below the C6 segment, suggesting a focal stenosis.  These results were called by telephone on 10/05/2012 at 08:25 a.m. to Dr. Lianne Moris nurse in the operating room, who verbally acknowledged these results.   Original Report Authenticated By: San Morelle, M.D.   Mr [facility] Wo Contrast  10/04/2012   *RADIOLOGY REPORT*  Clinical Data: Stroke  MRA HEAD WITHOUT CONTRAST  Technique: Angiographic images of the Circle of Willis were obtained using MRA technique without intravenous contrast.  Comparison: MRI 10/03/2012  Findings: Both vertebral arteries are patent to the basilar, the right vertebral artery is dominant.  PICA not visualized.  AICA patent bilaterally.  Superior cerebellar arteries are patent bilaterally.  Right posterior cerebral artery is patent with mild irregularity distally.  There is a moderate stenosis of the proximal left posterior cerebral artery which is patent distally.  The right internal carotid artery is occluded.  The right anterior and middle cerebral arteries are supplied from the anterior communicating artery and have decreased signal due to low flow. Left posterior communicating artery is patent.  Right posterior communicating artery is not visualized.  There is probable irregularity in the right middle cerebral artery branches which could be thrombus or atherosclerotic disease.  The left middle cerebral artery is patent without significant with mild irregularity.  Both anterior cerebral arteries are patent.  Negative  for aneurysm.  Image quality degraded by patient motion.  IMPRESSION: Occluded right internal carotid artery.  The right anterior and middle cerebral arteries are supplied by the anterior communicating artery.  There is probable atherosclerotic disease in the middle cerebral arteries bilaterally.  There is a moderate stenosis of the proximal left posterior cerebral artery.   Original Report Authenticated By: Carl Best, M.D.    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 1. Does the need for close, 24 hr/day medical supervision in concert with the patient's rehab needs make it unreasonable for this patient to be served in a less intensive setting? Potentially 2. Co-Morbidities requiring supervision/potential complications: Well be postoperative left carotid endarterectomy 3. Due to bladder management, bowel management, safety, skin/wound care, disease management, medication administration, pain management and patient education, does the patient require 24 hr/day rehab nursing?  Potentially 4. Does the patient require coordinated care of a physician, rehab nurse, PT (1-2 hrs/day, 5 days/week) and OT (1-2 hrs/day, 5 days/week) to address physical and functional deficits in the context of the above medical diagnosis(es)? Potentially Addressing deficits in the following areas: balance, endurance, locomotion, strength, transferring, bowel/bladder control, bathing, dressing, feeding, grooming and toileting 5. Can the patient actively participate in an intensive therapy program of at least 3 hrs of therapy per day at least 5 days per week? Potentially 6. The potential for patient to make measurable gains while on inpatient rehab is good 7. Anticipated functional outcomes upon discharge from inpatient rehab are supervision mobility with PT, supervision ADLs with OT, not applicable with SLP. 8. Estimated rehab length of stay to reach  the above functional goals is: Probably 1-2 weeks 9. Does the patient have adequate social supports to accommodate these discharge functional goals? Potentially 10. Anticipated D/C setting: Home 11. Anticipated post D/C treatments: Outpt therapy 12. Overall Rehab/Functional Prognosis: good  RECOMMENDATIONS: This patient's condition is appropriate for continued rehabilitative care in the following setting: Anticipate CIR after left carotid endarterectomy after post op reassessment . Patient has agreed to participate in recommended program. Yes Note that insurance prior authorization may be required for reimbursement for recommended care.  Comment: L CEA 8/8, reassess 8/11    10/05/2012

## 2012-10-05 NOTE — Progress Notes (Signed)
[facility] PROGRESS NOTE  LYNDEL BIELE [id number] DOB: [birth year] DOA: 10/03/2012 PCP: Manon Hilding, MD  Assessment/Plan:  Principal Problem:   Acute CVA (cerebrovascular accident), bilateral watershed. Continue aspirin, statin, physical therapy, occupational therapy. Cardizem dose decreased yesterday for permissive hypertension in the setting of severe cerebrovascular disease. Will give labetalol when necessary systolic blood pressure above 180. Diltiazem CD 120 mg given today. Will change to 180 mg, as blood pressure quite high this morning with systolic of [id number]. Physical therapy is recommending inpatient rehabilitation. Patient will be a good candidate for same. Will order rehabilitation consult.  Active Problems:   Occlusion and stenosis of carotid artery with cerebral infarction: For left carotid endarterectomy tomorrow.  Occlusion of left subclavian artery    Hypertension: See above    Obesity    Hyperglycemia hemoglobin A1c only 6.1, but blood glucoses have been running high. Will continue to monitor. Will likely need adjustment in diet, and possibly metformin eventually once her clinical picture becomes more clear.    Tobacco abuse, counseled against. Patient wishes to quit. Will have RN provide counseling    Other and unspecified hyperlipidemia: Started on statin    Renal insufficiency: Baseline creatinine unknown. Monitor.  Watch for signs of depression.  Code Status: full Family Communication: Sister and son at bedside Disposition Plan: CIR once medically stable postop  Consultants:  Stroke team  Vascular surgery  Rehabilitation medicine  Procedures:    Antibiotics:  HPI/Subjective: Patient is having a difficult time coping. Was unable to sleep last night. Feels that her difficulty walking is worse. Feels that her upper extremity clumsiness has worsened. Wishes to quit smoking. Admits to feelings of depression even prior to this event. He was  started on Klonopin as needed last week, but this just makes her too sleepy.  Objective: Filed Vitals:   10/05/12 0911  BP: 221/72  Pulse: 79  Temp: 98.2 F (36.8 C)  Resp: 17    Intake/Output Summary (Last 24 hours) at 10/05/12 1044 Last data filed at 10/05/12 1035  Gross per 24 hour  Intake      0 ml  Output    325 ml  Net   -325 ml   Filed Weights   10/04/12 0027 10/04/12 0131  Weight: 89.359 kg (197 lb) 89.54 kg (197 lb 6.4 oz)    Exam:   General:  Seated in chair. Alert oriented and appropriate. Having a difficult time trying to eat breakfast. Occasionally tearful  Cardiovascular: Regular rate rhythm without murmurs gallops rub  Respiratory: Clear to auscultation bilaterally without wheezes rhonchi or rales  Abdomen: Soft nontender nondistended  Extremities: No edema  Neurologic: Coordination of both upper extremities seems a bit worse today but strength normal. Left leg weakness noted  Data Reviewed: Basic Metabolic Panel:  Recent Labs Lab 10/01/12 1822 10/01/12 2119 10/03/12 1813 10/04/12 0520  NA 134*  --  134*  --   K 2.6* 3.0* 3.6  --   CL 96  --  97  --   CO2 28  --  26  --   GLUCOSE 170*  --  143*  --   BUN 19  --  13  --   CREATININE 1.15*  --  1.14* 1.21*  CALCIUM 8.8  --  9.5  --    Liver Function Tests:  Recent Labs Lab 10/01/12 1822 10/03/12 1813  AST 13 16  ALT 15 14  ALKPHOS 82 92  BILITOT 0.2* 0.3  PROT 6.9 7.4  ALBUMIN  3.2* 3.6   No results found for this basename: LIPASE, AMYLASE,  in the last 168 hours No results found for this basename: AMMONIA,  in the last 168 hours CBC:  Recent Labs Lab 10/01/12 1822 10/03/12 1813 10/04/12 0520  WBC 10.8* 12.8* 10.7*  NEUTROABS 7.2 8.3*  --   HGB 12.2 14.2 12.5  HCT 35.5* 40.1 36.7  MCV 85.5 83.9 85.3  PLT 339 344 315   Cardiac Enzymes:  Recent Labs Lab 10/01/12 1822 10/03/12 1815  TROPONINI <0.30 <0.30   BNP (last 3 results) No results found for this basename:  PROBNP,  in the last 8760 hours CBG:  Recent Labs Lab 10/04/12 0139 10/04/12 1155 10/04/12 1728 10/04/12 2103 10/05/12 0810  GLUCAP 137* 123* 169* 129* 151*    No results found for this or any previous visit (from the past 240 hour(s)).   Studies: Ct Head Wo Contrast  10/03/2012   *RADIOLOGY REPORT*  Clinical Data: Dizziness.  CT HEAD WITHOUT CONTRAST  Technique:  Contiguous axial images were obtained from the base of the skull through the vertex without contrast.  Comparison: MRI of the brain 10/03/2012.  Findings: There are multifocal ill-defined areas of decreased attenuation throughout the deep and periventricular white matter of the cerebral hemispheres bilaterally.  While some of these are compatible with chronic microvascular ischemic changes, many are more focal and lower density than typically seen in the setting of chronic microvascular ischemia, and are compatible with evolving acute/subacute infarctions. Similar findings are also found in the subcortical white matter and portions of the cortex bilaterally. No acute intracranial hemorrhage.  No evidence of mass, mass effect, hydrocephalus or abnormal intra or extra-axial fluid collections.  Visualized paranasal sinuses and mastoids are well pneumatized.  No acute displaced skull fractures are noted.  IMPRESSION: 1.  Multi focal acute/subacute ischemia throughout the cerebral hemispheres bilaterally, similar to recent brain MRI 10/03/2012. Findings are again concerning for multifocal embolic infarcts from potential cardiac source.  Echocardiographic correlation for evidence of intracardiac thrombus or vegetation from infective endocarditis is strongly recommended.  Critical Value/emergent results were called by telephone at the time of interpretation on 10/03/2012 at 08:58 p.m. to Dr. Rogene Houston, who verbally acknowledged these results.   Original Report Authenticated By: Vinnie Langton, M.D.   Ct Angio Neck W/cm &/or Wo/cm  10/05/2012    *RADIOLOGY REPORT*  Clinical Data:  Left-sided weakness.  CVA.  CT ANGIOGRAPHY NECK  Technique:  Multidetector CT imaging of the neck was performed using the standard protocol during bolus administration of intravenous contrast.  Multiplanar CT image reconstructions including MIPs were obtained to evaluate the vascular anatomy. Carotid stenosis measurements (when applicable) are obtained utilizing NASCET criteria, using the distal internal carotid diameter as the denominator.  Contrast: 76mL OMNIPAQUE IOHEXOL 350 MG/ML SOLN  Comparison:   MRI brain 10/03/2012.  MRA head 10/04/2012.  Findings:  There is a combined origin of the left common carotid artery and the innominate artery.  Extensive atherosclerotic irregularity is present throughout the aortic arch.  This is predominately soft plaque with ulcerations. There is soft tissue plaque along the anterior margin of the ascending innominate artery.  The left subclavian artery is occluded 12 mm after its origin. There is flow in the left vertebral artery which may be reversed. The left subclavian artery is opacified.  The right vertebral artery is the dominant vessel.  There is mild tortuosity proximally without significant stenosis.  Additional tortuosity is present at the C2-3 level on the right without  significant stenosis.  The right vertebral artery is dominant throughout the neck.  Vessel size is diminished to below the level of C6 on the left.  The left subclavian artery is reconstituted by both the left vertebral artery and the high cervical artery.  The right common carotid artery is within normal limits.  The right internal carotid artery is occluded at its origin.  There is a high- grade stenosis of the proximal right external carotid artery.  Atherosclerotic calcifications are present at the origin of the left common carotid artery without a significant stenosis.  A high- grade, near occlusive stenosis is present in the proximal left internal carotid  artery essentially a string sign extending for 13 mm.  The more distal left internal carotid is smaller than expected in caliber at 3.5 mm.  There is minimal flow within the petrous right internal carotid artery, but no flow below the skull base.  The transverse sinuses are opacified.  Limited imaging of the brain is unremarkable.   Review of the MIP images confirms the above findings.  IMPRESSION:  1.  Occluded right internal carotid artery with slight reconstitution at the petrous segment. 2.  High-grade, near occlusive proximal left internal carotid artery stenosis. 3.  Occluded left subclavian artery. 4.  Normal appearance of the right vertebral artery.  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 5.  Decreased vessel diameter of the left vertebral artery below the C6 segment, suggesting a focal stenosis.  These results were called by telephone on 10/05/2012 at 08:25 a.m. to Dr. Lianne Moris nurse in the operating room, who verbally acknowledged these results.   Original Report Authenticated By: San Morelle, M.D.   Mr [facility] Wo Contrast  10/04/2012   *RADIOLOGY REPORT*  Clinical Data: Stroke  MRA HEAD WITHOUT CONTRAST  Technique: Angiographic images of the Circle of Willis were obtained using MRA technique without intravenous contrast.  Comparison: MRI 10/03/2012  Findings: Both vertebral arteries are patent to the basilar, the right vertebral artery is dominant.  PICA not visualized.  AICA patent bilaterally.  Superior cerebellar arteries are patent bilaterally.  Right posterior cerebral artery is patent with mild irregularity distally.  There is a moderate stenosis of the proximal left posterior cerebral artery which is patent distally.  The right internal carotid artery is occluded.  The right anterior and middle cerebral arteries are supplied from the anterior communicating artery and have decreased signal due to low flow. Left posterior communicating artery is  patent.  Right posterior communicating artery is not visualized.  There is probable irregularity in the right middle cerebral artery branches which could be thrombus or atherosclerotic disease.  The left middle cerebral artery is patent without significant with mild irregularity.  Both anterior cerebral arteries are patent.  Negative for aneurysm.  Image quality degraded by patient motion.  IMPRESSION: Occluded right internal carotid artery.  The right anterior and middle cerebral arteries are supplied by the anterior communicating artery.  There is probable atherosclerotic disease in the middle cerebral arteries bilaterally.  There is a moderate stenosis of the proximal left posterior cerebral artery.   Original Report Authenticated By: Carl Best, M.D.    Scheduled Meds: . aspirin EC  81 mg Oral Daily  . [START ON 10/06/2012] diltiazem  180 mg Oral Daily  . enoxaparin (LOVENOX) injection  40 mg Subcutaneous Q24H  . famotidine  20 mg Oral Daily  . potassium chloride SA  20 mEq Oral Daily  .  simvastatin  10 mg Oral q1800   Continuous Infusions:   Time spent: 40 min  [facility] Pager [phone]. If 7PM-7AM, please contact night-coverage at [email or web address], password [facility] 10/05/2012, 10:44 AM  LOS: 2 days

## 2012-10-05 NOTE — Progress Notes (Signed)
Rehab Admissions Coordinator Note:  Patient was screened by Theora Master for appropriateness for an Inpatient Acute Rehab Consult.  At this time, we are recommending Inpatient Rehab consult.  Theora Master 10/05/2012, 9:58 AM  I can be reached at [phone].

## 2012-10-05 NOTE — Progress Notes (Signed)
Clinical Social Work Department BRIEF PSYCHOSOCIAL ASSESSMENT 10/05/2012  Patient:  Kelli [name], Kelli [name]     Account Number:  [id number]     Admit date:  10/03/2012  Clinical Social Worker:  Adair Laundry  Date/Time:  10/05/2012 03:51 PM  Referred by:  Physician  Date Referred:  10/05/2012 Referred for  SNF Placement   Other Referral:   Interview type:  Patient Other interview type:   Pt family also in pt room at time of assessment    PSYCHOSOCIAL DATA Living Status:  SIGNIFICANT OTHER Admitted from facility:   Level of care:   Primary support name:  Kelli [name] [id number] Primary support relationship to patient:  SPOUSE Degree of support available:   Pt has good family support.    CURRENT CONCERNS Current Concerns  Post-Acute Placement   Other Concerns:    SOCIAL WORK ASSESSMENT / PLAN CSW informed that pt is under consideration for CIR. CSW spoke with pt and pt family about possible back-ups incase pt does not qualify for CIR. Pt informed CSW that she would want to return home with possible assistance at home if CIR is not an option. Pt and pt family do not wish to consider SNFs at this time. Pt family reported if pt does return home they would be available to assist. CSW informed Nurse Case Manger of possible HH needs if pt does not transfer to CIR. No additional needs at this time, CSW signing off.    Assessment/plan status:  No Further Intervention Required Other assessment/ plan:   Information/referral to community resources:   Pt denied SNF list at this time    [name]/FAMILY'S RESPONSE TO PLAN OF CARE: Pt seated on bed and verbally responsive at time of assessment. Pt family seated around pt room as well and involved in assessment.       [name], [place]

## 2012-10-05 NOTE — Progress Notes (Signed)
Stroke Team Progress Note  HISTORY Kelli [name] is an 62 y.o. female, right handed, with a past medical history significant for HTN, smoking, fibromyalgia, sent from [facility] 10/03/2012 for further evaluation. MRI brain showed bi-cerebral strokes.    On 8/2 she started noticing some unusual fatigue associated with right arm numbness, poor balance, disorientation, confused to the point that she was not able to connect things, blurred vision, as well as a sensation of heaviness both arms. Never had similar symptoms before and decided to go to to [facility] ED the next day where she had a CT brain that showed no acute intracranial abnormality. She was discharged from the ED 8/3.  Her symptoms persisted, she presented to [facility] where an MRI revealed multiple ischemic infarcts in both cerebral hemispheres.she was transferred to [facility] where a CT brain was performed upon arrival to [facility] ED which disclosed multi focal acute/subacute ischemia throughout the cerebral hemispheres bilaterally, similar to recent brain MRI. She denies headache, vertigo, double vision, difficulty swallowing, slurred speech, language or vision impairment. No recent fever, infection, head or neck trauma.   Patient was not a TPA candidate secondary to delay in arrival from symptom onset. She was admitted for further evaluation and treatment.  SUBJECTIVE   OBJECTIVE Most recent Vital Signs: Filed Vitals:   10/04/12 2000 10/05/12 0000 10/05/12 0400 10/05/12 0911  BP: 211/95 154/96 154/88 221/72  Pulse: 93 90 81 79  Temp: 98.3 F (36.8 C) 97.5 F (36.4 C) 97.7 F (36.5 C) 98.2 F (36.8 C)  TempSrc: Oral Oral Oral Oral  Resp:  18 18 17   Height:      Weight:      SpO2: 98% 97% 96% 97%   CBG (last 3)   Recent Labs  10/04/12 1728 10/04/12 2103 10/05/12 0810  GLUCAP 169* 129* 151*    IV Fluid Intake:   . sodium chloride      MEDICATIONS  . aspirin EC  81 mg Oral Daily  . diltiazem  120 mg Oral Daily  .  enoxaparin (LOVENOX) injection  40 mg Subcutaneous Q24H  . famotidine  20 mg Oral Daily  . potassium chloride SA  20 mEq Oral Daily  . simvastatin  10 mg Oral q1800   PRN:  clonazePAM, ondansetron  Diet:  Cardiac thin liquids Activity:  Bathroom privileges with assistance DVT Prophylaxis:  Lovenox 40 mg sq daily   CLINICALLY SIGNIFICANT STUDIES Basic Metabolic Panel:   Recent Labs Lab 10/01/12 1822 10/01/12 2119 10/03/12 1813 10/04/12 0520  NA 134*  --  134*  --   K 2.6* 3.0* 3.6  --   CL 96  --  97  --   CO2 28  --  26  --   GLUCOSE 170*  --  143*  --   BUN 19  --  13  --   CREATININE 1.15*  --  1.14* 1.21*  CALCIUM 8.8  --  9.5  --    Liver Function Tests:   Recent Labs Lab 10/01/12 1822 10/03/12 1813  AST 13 16  ALT 15 14  ALKPHOS 82 92  BILITOT 0.2* 0.3  PROT 6.9 7.4  ALBUMIN 3.2* 3.6   CBC:   Recent Labs Lab 10/01/12 1822 10/03/12 1813 10/04/12 0520  WBC 10.8* 12.8* 10.7*  NEUTROABS 7.2 8.3*  --   HGB 12.2 14.2 12.5  HCT 35.5* 40.1 36.7  MCV 85.5 83.9 85.3  PLT 339 344 315   Coagulation:   Recent Labs  Lab 10/03/12 1813  LABPROT 12.4  INR 0.94   Cardiac Enzymes:   Recent Labs Lab 10/01/12 1822 10/03/12 1815  TROPONINI <0.30 <0.30   Urinalysis:   Recent Labs Lab 10/01/12 1829  COLORURINE YELLOW  LABSPEC 1.025  PHURINE 7.0  GLUCOSEU NEGATIVE  HGBUR SMALL*  BILIRUBINUR NEGATIVE  KETONESUR NEGATIVE  PROTEINUR >300*  UROBILINOGEN 0.2  NITRITE NEGATIVE  LEUKOCYTESUR NEGATIVE   Lipid Panel    Component Value Date/Time   CHOL 211* 10/05/2012 0027   TRIG 302* 10/05/2012 0027   HDL 30* 10/05/2012 0027   CHOLHDL 7.0 10/05/2012 0027   VLDL 60* 10/05/2012 0027   LDLCALC 121* 10/05/2012 0027   HgbA1C  Lab Results  Component Value Date   HGBA1C 6.1* 10/04/2012    Urine Drug Screen:      Component Value Date/Time   LABOPIA NONE DETECTED 10/05/2012 0011    Alcohol Level: No results found for this basename: ETH,  in the last 168  hours  CT of the brain   10/03/2012  1.  Multi focal acute/subacute ischemia throughout the cerebral hemispheres bilaterally, similar to recent brain MRI 10/03/2012.  10/01/2012 (at [facility] Chronic microvascular ischemic changes in the white matter. No acute process by noncontrast CT.   MRI of the brain  (done at [facility]) Bilateral watershed infarcts  MRA of the brain  10/04/2012  Occluded right internal carotid artery.  The right anterior and middle cerebral arteries are supplied by the anterior communicating artery.  There is probable atherosclerotic disease in the middle cerebral arteries bilaterally.  There is a moderate stenosis of the proximal left posterior cerebral artery.   2D Echocardiogram  EF 55-60% with no source of embolus.   Carotid Doppler  10/04/2012 Right: Occluded internal carotid artery. Left: Greater than 80% internal carotid artery stenosis. Right: Vertebral artery flow is antegrade. Left: Vertebral artery retrograde.   CXR  10/01/2012 Mild hyperinflation and chronic bronchitic changes, suspect COPD/emphysema No superimposed CHF or pneumonia  EKG  normal sinus rhythm.   Therapy Recommendations CIR  Physical Exam   Pleasant middle-aged Caucasian lady currently not in distress.Awake alert. Afebrile. Head is nontraumatic. Neck is supple without bruit. Hearing is normal. Cardiac exam no murmur or gallop. Lungs are clear to auscultation. Distal pulses are well felt. Neurological Exam :  Awake alert oriented x3 with normal speech and language function. Seems a bit anxious today .Vision acuity and fields appear normal. Fundi were not visualized.   mild left lower facial weakness. Tongue is midline. Motor system exam reveals left upper extremity drift and weakness in the left grip and intrinsic hand muscles. Orbits right over left upper extremity. Symmetric lower extremity strength. Sensation is intact. Gait was not tested. ASSESSMENT Kelli [name] is a 62 y.o. female presenting  with unsteadiness, right upper extremity numbness, heaviness bilateral UE, disorientation. Imaging confirms bilateral watershed infarcts. Infarcts felt to be thromboembolic secondary to carotid stenosis - carotid dopplers confirm  occlusion of right ICA and > 80% left ICA stenosis.  On no antithrombotic prior to admission. Now on aspirin 81 mg orally every day for secondary stroke prevention.   Hypertension  Hyperlipidemia, LDL 126, on no statin PTA, now on statin, goal LDL < 100  Fibromyalgia Cigarette smoker  Dr. Donnetta Hutching consulted for left ICA stenosis  Hospital day # 2  TREATMENT/PLAN  Continue  aspirin 81 mg orally every day for secondary stroke prevention.  L CEA planned for Friday by Dr. Donnetta Hutching  Continue IVF at  50cc/hr  Plan transfer to rehab post CEA   Burnetta Sabin, MSN, RN, ANVP-BC, ANP-BC, [facility] Pager: [phone] 10/05/2012 9:32 AM  I have personally obtained a history, examined the patient, evaluated imaging results, and formulated the assessment and plan of care. I agree with the above.  Antony Contras, MD

## 2012-10-06 ENCOUNTER — Encounter (HOSPITAL_COMMUNITY)
Admission: EM | Disposition: A | Discharge status: Rehabilitation Facility | Payer: Self-pay | Source: Home / Self Care | Attending: Vascular Surgery

## 2012-10-06 ENCOUNTER — Encounter (HOSPITAL_COMMUNITY): Payer: Self-pay | Admitting: Anesthesiology

## 2012-10-06 ENCOUNTER — Inpatient Hospital Stay (HOSPITAL_COMMUNITY): Payer: 59 | Admitting: Anesthesiology

## 2012-10-06 ENCOUNTER — Telehealth: Payer: Self-pay | Admitting: Vascular Surgery

## 2012-10-06 HISTORY — PX: ENDARTERECTOMY: SHX5162

## 2012-10-06 LAB — GLUCOSE, CAPILLARY
Glucose-Capillary: 176 mg/dL — ABNORMAL HIGH (ref 70–99)
Glucose-Capillary: 156 mg/dL — ABNORMAL HIGH (ref 70–99)

## 2012-10-06 LAB — BASIC METABOLIC PANEL
BUN: 19 mg/dL (ref 6–23)
CO2: 26 mEq/L (ref 19–32)
Calcium: 8.7 mg/dL (ref 8.4–10.5)
GFR calc non Af Amer: 44 mL/min — ABNORMAL LOW (ref >90)
Glucose, Bld: 134 mg/dL — ABNORMAL HIGH (ref 70–99)
Potassium: 3.2 mEq/L — ABNORMAL LOW (ref 3.5–5.1)
Sodium: 140 mEq/L (ref 135–145)

## 2012-10-06 LAB — CBC
HCT: 36 % (ref 36.0–46.0)
Hemoglobin: 12.2 g/dL (ref 12.0–15.0)
MCH: 28.9 pg (ref 26.0–34.0)
MCHC: 33.9 g/dL (ref 30.0–36.0)
MCV: 85.3 fL (ref 78.0–100.0)

## 2012-10-06 LAB — POCT I-STAT 3, ART BLOOD GAS (G3+)
pCO2 arterial: 37.1 mmHg (ref 35.0–45.0)
pH, Arterial: 7.426 (ref 7.350–7.450)

## 2012-10-06 SURGERY — ENDARTERECTOMY, CAROTID
Anesthesia: General | Site: Neck | Laterality: Left | Wound class: Clean

## 2012-10-06 MED ORDER — GLYCOPYRROLATE 0.2 MG/ML IJ SOLN
INTRAMUSCULAR | Status: DC | PRN
  Administered 2012-10-06: 0.6 mg via INTRAVENOUS

## 2012-10-06 MED ORDER — LIDOCAINE HCL (PF) 1 % IJ SOLN
INTRAMUSCULAR | Status: AC
  Filled 2012-10-06: qty 30

## 2012-10-06 MED ORDER — FENTANYL CITRATE 0.05 MG/ML IJ SOLN: 50.0000 ug | Freq: Once | INTRAMUSCULAR | Status: DC

## 2012-10-06 MED ORDER — CALCIUM CHLORIDE 10 % IV SOLN: 1.0000 g | Freq: Once | INTRAVENOUS | Status: DC

## 2012-10-06 MED ORDER — ROCURONIUM BROMIDE 100 MG/10ML IV SOLN
INTRAVENOUS | Status: DC | PRN
  Administered 2012-10-06: 50 mg via INTRAVENOUS

## 2012-10-06 MED ORDER — METOPROLOL TARTRATE 1 MG/ML IV SOLN
2.0000 mg | INTRAVENOUS | Status: AC | PRN
  Administered 2012-10-07 (×2): 5 mg via INTRAVENOUS
  Filled 2012-10-06 (×2): qty 5

## 2012-10-06 MED ORDER — DEXTROSE 5 % IV SOLN
1.5000 g | Freq: Two times a day (BID) | INTRAVENOUS | Status: AC
  Administered 2012-10-06 – 2012-10-07 (×2): 1.5 g via INTRAVENOUS
  Filled 2012-10-06 (×2): qty 1.5

## 2012-10-06 MED ORDER — ACETAMINOPHEN 650 MG RE SUPP: 325.0000 mg | RECTAL | Status: DC | PRN

## 2012-10-06 MED ORDER — GUAIFENESIN-DM 100-10 MG/5ML PO SYRP: 15.0000 mL | ORAL_SOLUTION | ORAL | Status: DC | PRN

## 2012-10-06 MED ORDER — SODIUM CHLORIDE 0.9 % IV SOLN
20.0000 mg | INTRAVENOUS | Status: DC | PRN
  Administered 2012-10-06: 25 ug/min via INTRAVENOUS

## 2012-10-06 MED ORDER — MIDAZOLAM HCL 2 MG/2ML IJ SOLN: 1.0000 mg | INTRAMUSCULAR | Status: DC | PRN

## 2012-10-06 MED ORDER — NITROPRUSSIDE SODIUM 25 MG/ML IV SOLN
0.2500 ug/kg/min | INTRAVENOUS | Status: AC
  Administered 2012-10-06: 0.5 ug/kg/min via INTRAVENOUS
  Filled 2012-10-06: qty 2

## 2012-10-06 MED ORDER — FENTANYL CITRATE 0.05 MG/ML IJ SOLN
INTRAMUSCULAR | Status: DC | PRN
  Administered 2012-10-06: 50 ug via INTRAVENOUS
  Administered 2012-10-06: 75 ug via INTRAVENOUS
  Administered 2012-10-06: 125 ug via INTRAVENOUS

## 2012-10-06 MED ORDER — LACTATED RINGERS IV SOLN
INTRAVENOUS | Status: DC | PRN
  Administered 2012-10-06 (×2): via INTRAVENOUS

## 2012-10-06 MED ORDER — LIDOCAINE HCL (CARDIAC) 20 MG/ML IV SOLN
INTRAVENOUS | Status: DC | PRN
  Administered 2012-10-06: 100 mg via INTRAVENOUS

## 2012-10-06 MED ORDER — CALCIUM CHLORIDE 10 % IV SOLN
1.0000 g | Freq: Once | INTRAVENOUS | Status: DC
  Filled 2012-10-06: qty 10

## 2012-10-06 MED ORDER — OXYCODONE HCL 5 MG/5ML PO SOLN: 5.0000 mg | Freq: Once | ORAL | Status: DC | PRN

## 2012-10-06 MED ORDER — ACETAMINOPHEN 325 MG PO TABS: 325.0000 mg | ORAL_TABLET | ORAL | Status: DC | PRN

## 2012-10-06 MED ORDER — LABETALOL HCL 5 MG/ML IV SOLN
INTRAVENOUS | Status: DC | PRN
Start: 2012-10-06 — End: 2012-10-06
  Administered 2012-10-06 (×5): 2.5 mg via INTRAVENOUS
  Administered 2012-10-06: 5 mg via INTRAVENOUS
  Administered 2012-10-06: 2.5 mg via INTRAVENOUS
  Administered 2012-10-06: 5 mg via INTRAVENOUS

## 2012-10-06 MED ORDER — SODIUM CHLORIDE 0.9 % IV SOLN: 500.0000 mL | Freq: Once | INTRAVENOUS | Status: AC | PRN

## 2012-10-06 MED ORDER — OXYCODONE-ACETAMINOPHEN 5-325 MG PO TABS: 1.0000 | ORAL_TABLET | ORAL | Status: DC | PRN

## 2012-10-06 MED ORDER — LABETALOL HCL 5 MG/ML IV SOLN
INTRAVENOUS | Status: AC
  Filled 2012-10-06: qty 4

## 2012-10-06 MED ORDER — PROTAMINE SULFATE 10 MG/ML IV SOLN
INTRAVENOUS | Status: DC | PRN
  Administered 2012-10-06 (×2): 20 mg via INTRAVENOUS
  Administered 2012-10-06: 10 mg via INTRAVENOUS

## 2012-10-06 MED ORDER — ONDANSETRON HCL 4 MG/2ML IJ SOLN
INTRAMUSCULAR | Status: DC | PRN
  Administered 2012-10-06: 4 mg via INTRAVENOUS

## 2012-10-06 MED ORDER — SODIUM CHLORIDE 0.9 % IV SOLN
INTRAVENOUS | Status: DC
  Administered 2012-10-06 (×2): 75 mL/h via INTRAVENOUS

## 2012-10-06 MED ORDER — ALBUTEROL SULFATE (5 MG/ML) 0.5% IN NEBU: 2.5000 mg | INHALATION_SOLUTION | Freq: Four times a day (QID) | RESPIRATORY_TRACT | Status: DC | PRN

## 2012-10-06 MED ORDER — HEPARIN SODIUM (PORCINE) 1000 UNIT/ML IJ SOLN
INTRAMUSCULAR | Status: DC | PRN
  Administered 2012-10-06: 8000 [IU] via INTRAVENOUS

## 2012-10-06 MED ORDER — FENTANYL CITRATE 0.05 MG/ML IJ SOLN
25.0000 ug | INTRAMUSCULAR | Status: DC | PRN
  Administered 2012-10-06: 25 ug via INTRAVENOUS

## 2012-10-06 MED ORDER — POTASSIUM CHLORIDE CRYS ER 20 MEQ PO TBCR: 20.0000 meq | EXTENDED_RELEASE_TABLET | Freq: Once | ORAL | Status: AC | PRN

## 2012-10-06 MED ORDER — FENTANYL CITRATE 0.05 MG/ML IJ SOLN
INTRAMUSCULAR | Status: AC
  Filled 2012-10-06: qty 2

## 2012-10-06 MED ORDER — HYDROMORPHONE HCL PF 1 MG/ML IJ SOLN
INTRAMUSCULAR | Status: AC
  Filled 2012-10-06: qty 1

## 2012-10-06 MED ORDER — DOCUSATE SODIUM 100 MG PO CAPS
100.0000 mg | ORAL_CAPSULE | Freq: Every day | ORAL | Status: DC
  Administered 2012-10-07 – 2012-10-09 (×3): 100 mg via ORAL
  Filled 2012-10-06 (×3): qty 1

## 2012-10-06 MED ORDER — SODIUM CHLORIDE 0.9 % IR SOLN
Status: DC | PRN
  Administered 2012-10-06: 11:00:00

## 2012-10-06 MED ORDER — PHENOL 1.4 % MT LIQD
1.0000 | OROMUCOSAL | Status: DC | PRN
  Filled 2012-10-06: qty 177

## 2012-10-06 MED ORDER — MIDAZOLAM HCL 5 MG/5ML IJ SOLN
INTRAMUSCULAR | Status: DC | PRN
  Administered 2012-10-06: 1 mg via INTRAVENOUS

## 2012-10-06 MED ORDER — OXYCODONE HCL 5 MG PO TABS: 5.0000 mg | ORAL_TABLET | Freq: Once | ORAL | Status: DC | PRN

## 2012-10-06 MED ORDER — MORPHINE SULFATE 2 MG/ML IJ SOLN
2.0000 mg | INTRAMUSCULAR | Status: DC | PRN
  Administered 2012-10-06 – 2012-10-09 (×8): 2 mg via INTRAVENOUS
  Filled 2012-10-06 (×8): qty 1

## 2012-10-06 MED ORDER — LABETALOL HCL 5 MG/ML IV SOLN
5.0000 mg | INTRAVENOUS | Status: DC | PRN
  Administered 2012-10-06 (×3): 5 mg via INTRAVENOUS

## 2012-10-06 MED ORDER — HYDRALAZINE HCL 20 MG/ML IJ SOLN
10.0000 mg | INTRAMUSCULAR | Status: DC | PRN
  Administered 2012-10-07 – 2012-10-09 (×9): 10 mg via INTRAVENOUS
  Filled 2012-10-06 (×7): qty 1

## 2012-10-06 MED ORDER — LACTATED RINGERS IV SOLN: INTRAVENOUS | Status: DC

## 2012-10-06 MED ORDER — 0.9 % SODIUM CHLORIDE (POUR BTL) OPTIME
TOPICAL | Status: DC | PRN
  Administered 2012-10-06: 1000 mL

## 2012-10-06 MED ORDER — PROPOFOL 10 MG/ML IV BOLUS
INTRAVENOUS | Status: DC | PRN
  Administered 2012-10-06: 200 mg via INTRAVENOUS

## 2012-10-06 MED ORDER — NEOSTIGMINE METHYLSULFATE 1 MG/ML IJ SOLN
INTRAMUSCULAR | Status: DC | PRN
  Administered 2012-10-06: 3 mg via INTRAVENOUS

## 2012-10-06 MED ORDER — ALBUMIN HUMAN 25 % IV SOLN
12.5000 g | Freq: Once | INTRAVENOUS | Status: DC
  Filled 2012-10-06: qty 50

## 2012-10-06 MED ORDER — NITROPRUSSIDE SODIUM 25 MG/ML IV SOLN
0.2500 ug/kg/min | INTRAVENOUS | Status: DC
  Administered 2012-10-06 (×2): 3.2 ug/kg/min via INTRAVENOUS
  Administered 2012-10-06: 3.6 ug/kg/min via INTRAVENOUS
  Administered 2012-10-07: 6 ug/kg/min via INTRAVENOUS
  Administered 2012-10-07: 5.5 ug/kg/min via INTRAVENOUS
  Filled 2012-10-06 (×6): qty 2

## 2012-10-06 MED ORDER — ASPIRIN EC 325 MG PO TBEC
325.0000 mg | DELAYED_RELEASE_TABLET | Freq: Every day | ORAL | Status: DC
  Administered 2012-10-07 – 2012-10-09 (×3): 325 mg via ORAL
  Filled 2012-10-06 (×4): qty 1

## 2012-10-06 MED ORDER — ARTIFICIAL TEARS OP OINT
TOPICAL_OINTMENT | OPHTHALMIC | Status: DC | PRN
  Administered 2012-10-06: 1 via OPHTHALMIC

## 2012-10-06 SURGICAL SUPPLY — 49 items
APL SKNCLS STERI-STRIP NONHPOA (GAUZE/BANDAGES/DRESSINGS) ×1
BENZOIN TINCTURE PRP APPL 2/3 (GAUZE/BANDAGES/DRESSINGS) ×2 IMPLANT
CANISTER SUCTION 2500CC (MISCELLANEOUS) ×2 IMPLANT
CATH ROBINSON RED A/P 18FR (CATHETERS) ×2 IMPLANT
CLIP LIGATING EXTRA MED SLVR (CLIP) ×2 IMPLANT
CLIP LIGATING EXTRA SM BLUE (MISCELLANEOUS) ×2 IMPLANT
CLOTH BEACON ORANGE TIMEOUT ST (SAFETY) ×2 IMPLANT
CLSR STERI-STRIP ANTIMIC 1/2X4 (GAUZE/BANDAGES/DRESSINGS) ×1 IMPLANT
COVER SURGICAL LIGHT HANDLE (MISCELLANEOUS) ×2 IMPLANT
CRADLE DONUT ADULT HEAD (MISCELLANEOUS) ×2 IMPLANT
DECANTER SPIKE VIAL GLASS SM (MISCELLANEOUS) IMPLANT
DRAIN HEMOVAC 1/8 X 5 (WOUND CARE) IMPLANT
DRAPE WARM FLUID 44X44 (DRAPE) ×2 IMPLANT
DRSG COVADERM 4X6 (GAUZE/BANDAGES/DRESSINGS) ×1 IMPLANT
ELECT REM PT RETURN 9FT ADLT (ELECTROSURGICAL) ×2
ELECTRODE REM PT RTRN 9FT ADLT (ELECTROSURGICAL) ×1 IMPLANT
EVACUATOR SILICONE 100CC (DRAIN) IMPLANT
GEL ULTRASOUND 20GR AQUASONIC (MISCELLANEOUS) IMPLANT
GLOVE BIO SURGEON STRL SZ7.5 (GLOVE) ×2 IMPLANT
GLOVE BIOGEL PI IND STRL 7.0 (GLOVE) IMPLANT
GLOVE BIOGEL PI IND STRL 7.5 (GLOVE) IMPLANT
GLOVE BIOGEL PI INDICATOR 7.0 (GLOVE) ×1
GLOVE BIOGEL PI INDICATOR 7.5 (GLOVE) ×1
GLOVE SS BIOGEL STRL SZ 6.5 (GLOVE) IMPLANT
GLOVE SS BIOGEL STRL SZ 7.5 (GLOVE) ×1 IMPLANT
GLOVE SUPERSENSE BIOGEL SZ 6.5 (GLOVE) ×1
GLOVE SUPERSENSE BIOGEL SZ 7.5 (GLOVE) ×1
GOWN STRL NON-REIN LRG LVL3 (GOWN DISPOSABLE) ×3 IMPLANT
KIT BASIN OR (CUSTOM PROCEDURE TRAY) ×2 IMPLANT
KIT ROOM TURNOVER OR (KITS) ×2 IMPLANT
NEEDLE 22X1 1/2 (OR ONLY) (NEEDLE) IMPLANT
NS IRRIG 1000ML POUR BTL (IV SOLUTION) ×4 IMPLANT
PACK CAROTID (CUSTOM PROCEDURE TRAY) ×2 IMPLANT
PAD ARMBOARD 7.5X6 YLW CONV (MISCELLANEOUS) ×4 IMPLANT
PATCH HEMASHIELD 8X75 (Vascular Products) ×1 IMPLANT
SHUNT CAROTID BYPASS 10 (VASCULAR PRODUCTS) ×1 IMPLANT
SHUNT CAROTID BYPASS 12FRX15.5 (VASCULAR PRODUCTS) IMPLANT
STRIP CLOSURE SKIN 1/2X4 (GAUZE/BANDAGES/DRESSINGS) ×2 IMPLANT
SUT ETHILON 3 0 PS 1 (SUTURE) IMPLANT
SUT PROLENE 6 0 CC (SUTURE) ×3 IMPLANT
SUT SILK 3 0 (SUTURE)
SUT SILK 3-0 18XBRD TIE 12 (SUTURE) IMPLANT
SUT VIC AB 3-0 SH 27 (SUTURE) ×4
SUT VIC AB 3-0 SH 27X BRD (SUTURE) ×2 IMPLANT
SUT VICRYL 4-0 PS2 18IN ABS (SUTURE) ×2 IMPLANT
SYR CONTROL 10ML LL (SYRINGE) IMPLANT
TOWEL OR 17X24 6PK STRL BLUE (TOWEL DISPOSABLE) ×2 IMPLANT
TOWEL OR 17X26 10 PK STRL BLUE (TOWEL DISPOSABLE) ×2 IMPLANT
WATER STERILE IRR 1000ML POUR (IV SOLUTION) ×2 IMPLANT

## 2012-10-06 NOTE — Progress Notes (Signed)
Seen postop. Discussed with Dr. Donnetta Hutching.  [facility] PROGRESS NOTE  Kelli [name] [id number] DOB: 1950-12-08 DOA: 10/03/2012 PCP: Manon Hilding, MD  Assessment/Plan:  Principal Problem:   Acute CVA (cerebrovascular accident), bilateral watershed. Doing well postop. Will follow.  Active Problems:   Occlusion and stenosis of carotid artery with cerebral infarction:  left carotid endarterectomy 8/8.  Occlusion of left subclavian artery    Hypertension: controlled on nipride gtt    Obesity    Hyperglycemia hemoglobin A1c only 6.1, but blood glucoses have been running high. Will continue to monitor. Will likely need adjustment in diet, and possibly metformin eventually once her clinical picture becomes more clear.    Tobacco abuse, counseled against.     Other and unspecified hyperlipidemia: on statin    Renal insufficiency: Baseline creatinine unknown. Monitor.   Code Status: full Family Communication: Sister and son at bedside Disposition Plan: CIR once medically stable postop  Consultants:  Stroke team  Vascular surgery  Rehabilitation medicine  Procedures:  Left CEA 10/06/12  Antibiotics:  HPI/Subjective: No pain. Some nausea  Objective: Filed Vitals:   10/06/12 1330  BP: 107/53  Pulse: 76  Temp: 97.5 F (36.4 C)  Resp: 14    Intake/Output Summary (Last 24 hours) at 10/06/12 1504 Last data filed at 10/06/12 1159  Gross per 24 hour  Intake   1500 ml  Output    100 ml  Net   1400 ml   Filed Weights   10/04/12 0027 10/04/12 0131 10/06/12 0400  Weight: 89.359 kg (197 lb) 89.54 kg (197 lb 6.4 oz) 89.812 kg (198 lb)    Exam:   General:  Drowsy. Arousable. Nods yes/no  Cardiovascular: Regular rate rhythm without murmurs gallops rub  Respiratory: Clear to auscultation bilaterally without wheezes rhonchi or rales  Abdomen: Soft nontender nondistended  Extremities: No edema. SCDs Neurologic: able to grip, both hands and wiggle toes.  Too  sedated to do much  Data Reviewed: Basic Metabolic Panel:  Recent Labs Lab 10/01/12 1822 10/01/12 2119 10/03/12 1813 10/04/12 0520 10/06/12 0525  NA 134*  --  134*  --  140  K 2.6* 3.0* 3.6  --  3.2*  CL 96  --  97  --  105  CO2 28  --  26  --  26  GLUCOSE 170*  --  143*  --  134*  BUN 19  --  13  --  19  CREATININE 1.15*  --  1.14* 1.21* 1.27*  CALCIUM 8.8  --  9.5  --  8.7   Liver Function Tests:  Recent Labs Lab 10/01/12 1822 10/03/12 1813  AST 13 16  ALT 15 14  ALKPHOS 82 92  BILITOT 0.2* 0.3  PROT 6.9 7.4  ALBUMIN 3.2* 3.6   No results found for this basename: LIPASE, AMYLASE,  in the last 168 hours No results found for this basename: AMMONIA,  in the last 168 hours CBC:  Recent Labs Lab 10/01/12 1822 10/03/12 1813 10/04/12 0520 10/06/12 0525  WBC 10.8* 12.8* 10.7* 8.4  NEUTROABS 7.2 8.3*  --   --   HGB 12.2 14.2 12.5 12.2  HCT 35.5* 40.1 36.7 36.0  MCV 85.5 83.9 85.3 85.3  PLT 339 344 315 258   Cardiac Enzymes:  Recent Labs Lab 10/01/12 1822 10/03/12 1815  TROPONINI <0.30 <0.30   BNP (last 3 results) No results found for this basename: PROBNP,  in the last 8760 hours CBG:  Recent Labs Lab 10/04/12 2103  10/05/12 0810 10/05/12 1112 10/05/12 1737 10/05/12 2144  GLUCAP 129* 151* 115* 153* 115*    Recent Results (from the past 240 hour(s))  SURGICAL PCR SCREEN     Status: None   Collection Time    10/06/12  4:15 AM      Result Value Range Status   MRSA, PCR NEGATIVE  NEGATIVE Final   Staphylococcus aureus NEGATIVE  NEGATIVE Final   Comment:            The Xpert SA Assay (FDA     approved for NASAL specimens     in patients over 61 years of age),     is one component of     a comprehensive surveillance     program.  Test performance has     been validated by [employer] for patients greater     than or equal to 14 year old.     It is not intended     to diagnose infection nor to     guide or monitor treatment.      Studies: Ct Angio Neck W/cm &/or Wo/cm  10/05/2012   *RADIOLOGY REPORT*  Clinical Data:  Left-sided weakness.  CVA.  CT ANGIOGRAPHY NECK  Technique:  Multidetector CT imaging of the neck was performed using the standard protocol during bolus administration of intravenous contrast.  Multiplanar CT image reconstructions including MIPs were obtained to evaluate the vascular anatomy. Carotid stenosis measurements (when applicable) are obtained utilizing NASCET criteria, using the distal internal carotid diameter as the denominator.  Contrast: 65mL OMNIPAQUE IOHEXOL 350 MG/ML SOLN  Comparison:   MRI brain 10/03/2012.  MRA head 10/04/2012.  Findings:  There is a combined origin of the left common carotid artery and the innominate artery.  Extensive atherosclerotic irregularity is present throughout the aortic arch.  This is predominately soft plaque with ulcerations. There is soft tissue plaque along the anterior margin of the ascending innominate artery.  The left subclavian artery is occluded 12 mm after its origin. There is flow in the left vertebral artery which may be reversed. The left subclavian artery is opacified.  The right vertebral artery is the dominant vessel.  There is mild tortuosity proximally without significant stenosis.  Additional tortuosity is present at the C2-3 level on the right without significant stenosis.  The right vertebral artery is dominant throughout the neck.  Vessel size is diminished to below the level of C6 on the left.  The left subclavian artery is reconstituted by both the left vertebral artery and the high cervical artery.  The right common carotid artery is within normal limits.  The right internal carotid artery is occluded at its origin.  There is a high- grade stenosis of the proximal right external carotid artery.  Atherosclerotic calcifications are present at the origin of the left common carotid artery without a significant stenosis.  A high- grade, near occlusive  stenosis is present in the proximal left internal carotid artery essentially a string sign extending for 13 mm.  The more distal left internal carotid is smaller than expected in caliber at 3.5 mm.  There is minimal flow within the petrous right internal carotid artery, but no flow below the skull base.  The transverse sinuses are opacified.  Limited imaging of the brain is unremarkable.   Review of the MIP images confirms the above findings.  IMPRESSION:  1.  Occluded right internal carotid artery with slight reconstitution at the petrous segment. 2.  High-grade, near occlusive proximal left internal carotid artery stenosis. 3.  Occluded left subclavian artery. 4.  Normal appearance of the right vertebral artery.  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 5.  Decreased vessel diameter of the left vertebral artery below the C6 segment, suggesting a focal stenosis.  These results were called by telephone on 10/05/2012 at 08:25 a.m. to Dr. Lianne Moris nurse in the operating room, who verbally acknowledged these results.   Original Report Authenticated By: San Morelle, M.D.    Scheduled Meds: . aspirin EC  81 mg Oral Daily  . diltiazem  180 mg Oral Daily  . famotidine  20 mg Oral Daily  . fentaNYL      . HYDROmorphone      . labetalol      . potassium chloride SA  20 mEq Oral Daily  . simvastatin  10 mg Oral q1800   Continuous Infusions: . lactated ringers    . nitroPRUSSide      Time spent: 20 min  [facility] Pager [phone]. If 7PM-7AM, please contact night-coverage at [email or web address], password [facility] 10/06/2012, 3:04 PM  LOS: 3 days

## 2012-10-06 NOTE — Anesthesia Preprocedure Evaluation (Addendum)
Anesthesia Evaluation  Patient identified by MRN, date of birth, ID band Patient awake    Reviewed: Allergy & Precautions, H&P , NPO status , [name] Chart, lab work & pertinent test results  Airway Mallampati: II TM Distance: >3 FB Neck ROM: Full    Dental  (+) Teeth Intact and Dental Advidsory Given   Pulmonary COPDCurrent Smoker,  + rhonchi         Cardiovascular hypertension, + Peripheral Vascular Disease Rhythm:Regular Rate:Normal  l subclav stenosis   Neuro/Psych  Neuromuscular disease CVA    GI/Hepatic   Endo/Other    Renal/GU Renal InsufficiencyRenal disease     Musculoskeletal  (+) Fibromyalgia -  Abdominal (+) + obese,   Peds  Hematology   Anesthesia Other Findings   Reproductive/Obstetrics                          Anesthesia Physical Anesthesia Plan  ASA: III  Anesthesia Plan: General   Post-op Pain Management:    Induction: Intravenous  Airway Management Planned: Oral ETT  Additional Equipment: Arterial line  Intra-op Plan:   Post-operative Plan: Extubation in OR  Informed Consent: I have reviewed the patients History and Physical, chart, labs and discussed the procedure including the risks, benefits and alternatives for the proposed anesthesia with the patient or authorized representative who has indicated his/her understanding and acceptance.   Dental Advisory Given  Plan Discussed with: CRNA, Surgeon and Anesthesiologist  Anesthesia Plan Comments:        Anesthesia Quick Evaluation

## 2012-10-06 NOTE — Progress Notes (Signed)
OT Cancellation Note  Patient Details Name: Kelli [name] MRN: [id number] DOB: March 18, 1950   Cancelled Treatment:    Reason Eval/Treat Not Completed: Patient at procedure or test/ unavailable (in OR for ENDARTERECTOMY CAROTID). Will check back when appropriate.  10/06/2012 Darrol Jump OTR/L Pager [phone] Office [phone]

## 2012-10-06 NOTE — Progress Notes (Signed)
Patient ID: Kelli [name], female   DOB: 10-31-1950, 62 y.o.   MRN: [id number] Resting comfortably in intensive care unit. Blood pressure control in the 0000000 range systolic on nitroprusside drip. Neurologic abscesses at her preop baseline. She answers questions and does have some clumsiness with her left hand. Neck without hematoma. Continue current support.

## 2012-10-06 NOTE — Interval H&P Note (Signed)
History and Physical Interval Note:  10/06/2012 7:15 AM  Kelli [name]  has presented today for surgery, with the diagnosis of LEFT ICA STENOSIS  The various methods of treatment have been discussed with the patient and family. After consideration of risks, benefits and other options for treatment, the patient has consented to  Procedure(s): ENDARTERECTOMY CAROTID (Left) as a surgical intervention .  The patient's history has been reviewed, patient examined, no change in status, stable for surgery.  I have reviewed the patient's chart and labs.  Questions were answered to the patient's satisfaction.     [name]

## 2012-10-06 NOTE — Transfer of Care (Signed)
Immediate Anesthesia Transfer of Care Note  Patient: Kelli [name]  Procedure(s) Performed: Procedure(s): Carotid Endarterectomy with Finesse patch angioplasty (Left)  Patient Location: PACU  Anesthesia Type:General  Level of Consciousness: patient cooperative and confused  Airway & Oxygen Therapy: Patient Spontanous Breathing and Patient connected to face mask oxygen  Post-op Assessment: Report given to PACU RN, Post -op Vital signs reviewed and stable, Patient moving all extremities X 4 and Patient able to stick tongue midline  Post vital signs: Reviewed and stable  Complications: No apparent anesthesia complications

## 2012-10-06 NOTE — H&P (View-Only) (Signed)
Patient ID: Kelli [name], female   DOB: Jul 31, 1950, 62 y.o.   MRN: [id number] The patient had a very restless night. She reports that the bed arm continue to go off with very little sleep. She reports that she did have some urinary incontinence and had some confusion. She has her at her same neuro level as well as saw her yesterday evening. Does have some apparent focal weakness on the left side.  Hemodynamically she remained stable  I did review her CT scan from last night. The official report is not available as of yet. This does show right internal carotid artery occlusion. She has trickle flow through a subtotal occlusion of her left internal carotid artery. The artery distal to this segment is widely patent going into her brain. She does have a left subclavian and proximal occlusion with a very small vertebral artery that was shown to have retrograde flow on duplex. She does have a widely patent dominant right vertebral artery.  Impression and plan: Critical ischemia to her brain. She has occluded right internal and trickle flow in her left internal carotid she has retrograde flow in her left vertebral artery. I have recommended left carotid endarterectomy tomorrow. I did explain the possibility of worsening stroke with reperfusion. Am quite concerned however that she has a bilateral watershed infarcts with a minimal flow to her brain. Discussed the procedure at length with her. Her husband is not present in the room this morning I have provided my pager #[phone] talk with him later on today to explain this. We would plan on surgery as a second case tomorrow approximately 9 AM

## 2012-10-06 NOTE — Progress Notes (Signed)
Rehab admissions - Evaluated for possible admission.  Please see rehab consult done yesterday by Dr. Letta Pate.  Patient down for CEA this am.  I will follow up on Monday for potential inpatient rehab needs.  Call me for questions.  RC:[phone]

## 2012-10-06 NOTE — Anesthesia Postprocedure Evaluation (Signed)
  Anesthesia Post-op Note  Patient: Kelli [name]  Procedure(s) Performed: Procedure(s): Carotid Endarterectomy with Finesse patch angioplasty (Left)  Patient Location: PACU  Anesthesia Type:General  Level of Consciousness: awake and alert   Airway and Oxygen Therapy: Patient Spontanous Breathing  Post-op Pain: mild  Post-op Assessment: Post-op Vital signs reviewed, [name] Cardiovascular Status Stable, Respiratory Function Stable, Patent Airway, No signs of Nausea or vomiting and Pain level controlled  Post-op Vital Signs: stable  Complications: No apparent anesthesia complications

## 2012-10-06 NOTE — Telephone Encounter (Signed)
LVM re appt, sent letter - kf

## 2012-10-06 NOTE — Progress Notes (Signed)
Dr early here at bedside aware of high bp orders obtained and carried out

## 2012-10-06 NOTE — Progress Notes (Signed)
niapride actually started at 1240 not 1316 as entered in mar

## 2012-10-06 NOTE — Progress Notes (Signed)
Patient in OR - CEA. Stroke Team will follow up post op/tomorrow.  Burnetta Sabin, MSN, RN, ANVP-BC, ANP-BC, GNP-BC [facility] Pager: [phone] 10/06/2012 10:41 AM  I agree with the above. Antony Contras, MD

## 2012-10-06 NOTE — Telephone Encounter (Signed)
Message copied by Berniece Salines on Fri Oct 06, 2012 12:41 PM ------      Message from: Richrd Prime      Created: Fri Oct 06, 2012 11:51 AM       3 week F/U CEA - Early ------

## 2012-10-06 NOTE — Op Note (Signed)
[facility]  Patient name: Kelli [name] MRN: [id number] DOB: 09-Apr-1950 Sex: female  10/03/2012 - 10/06/2012 Pre-operative Diagnosis: Symptomatic left carotid stenosis Post-operative diagnosis:  Same Surgeon:  [name], M.D. Assistants:  Roczniak Procedure:    left carotid Endarterectomy with Dacron patch angioplasty Anesthesia:  General Blood Loss:  See anesthesia record Specimens:  Carotid Plaque to pathology  Indications for surgery:  The patient presented with by cerebral watershed infarcts. Ultrasound Doppler and CT angiogram revealed occlusion of her right internal carotid artery with subtotal occlusion of her left internal carotid artery. She also has occlusion of her proximal left subclavian artery with retrograde flow in her left vertebral artery. She does have a dominant right vertebral artery. She is taken to the operative this time for endarterectomy with very hypoperfused global brain. Discussed the procedure with usual risks and explained there was a greater risk for reperfusion injury or bleed related to her recent stroke and minimal flow to her brain. Feel that her risk for occlusion of her left internal carotid was trickle flow to 4 out seeds the risk of surgery and she understands and wished to proceed with surgery  Procedure in detail:  The patient was taken to the operating and placed in the supine position. The neck was prepped and draped in the usual sterile fashion. An incision was made anterior to the sternocleidomastoid muscle and continued with electrocautery through the platysma muscle. The muscle was retracted posteriorly and the carotid sheath was opened. The facial vein was ligated with 2-0 silk ties and divided. The common carotid artery was encircled with an umbilical tape and Rummel tourniquet. Dissection was continued onto the carotid bifurcation. The superior thyroid artery was controlled with a 2-0 silk Potts tie. The external carotid  organ was encircled with a vessel loop and the internal carotid was encircled with umbilical tape and Rummel tourniquet. The hypoglossal and vagus nerves were identified and preserved.  The patient was given systemic heparinization. After adequate circulation time, the internal,external and common carotid arteries were occluded. The common carotid was opened with an 11 blade and the arteriotomy was continued with Potts scissors onto the internal carotid artery. A 10 shunt was passed up the internal carotid artery, allowed to back bleed, and then passed down the common carotid artery. The shunt was secured with Rummel tourniquet. The endarterectomy was begun on the common carotid artery  plaque was divided proximally with Potts scissors. The endarterectomy was continued onto the carotid bifurcation. The external carotid was endarterectomized by eversion technique and the internal carotid artery was endarterectomized in an open fashion. Remaining debris was removed from the endarterectomy plane. A Dacron patch was brought to the field and sewn as a patch angioplasty. Prior to completing the anastomosis, the shunt was removed and the usual flushing maneuvers were undertaken. The anastomosis was then completed and flow was restored first to the external and then the internal carotid artery. Excellent flow characteristics were noted with hand-held Doppler in the internal and external carotid arteries.  The patient was given protamine to reverse the heparin. Hemostasis was obtained with electrocautery. The wounds were irrigated with saline. The wound was closed by first reapproximating the sternocleidomastoid muscle over the carotid artery with interrupted 3-0 Vicryl sutures. Next, the platysma was closed with a running 3-0 Vicryl suture. The skin was closed with a 4-0 subcuticular Vicryl suture. Benzoin and Steri-Strips were applied to the incision. A sterile dressing was placed over the incision. All sponge  and  needle counts were correct. The patient was awakened in the operating room, neurologically intact. They were transferred to the PACU in stable condition.  Carotid stenosis at surgery: Greater than 90%  Disposition:  To PACU in stable condition,neurologically intact  Relevant Operative Details:  High-grade stenosis located at the bifurcation with normal internal carotid artery distally  [name], M.D. [facility] Office: [phone] Pager:  [phone]

## 2012-10-07 LAB — CBC
HCT: 29.6 % — ABNORMAL LOW (ref 36.0–46.0)
MCHC: 34.5 g/dL (ref 30.0–36.0)
Platelets: 262 10*3/uL (ref 150–400)
RDW: 13.7 % (ref 11.5–15.5)
WBC: 11.8 10*3/uL — ABNORMAL HIGH (ref 4.0–10.5)

## 2012-10-07 LAB — GLUCOSE, CAPILLARY
Glucose-Capillary: 113 mg/dL — ABNORMAL HIGH (ref 70–99)
Glucose-Capillary: 164 mg/dL — ABNORMAL HIGH (ref 70–99)

## 2012-10-07 LAB — BASIC METABOLIC PANEL
BUN: 20 mg/dL (ref 6–23)
Calcium: 7.7 mg/dL — ABNORMAL LOW (ref 8.4–10.5)
Creatinine, Ser: 1.69 mg/dL — ABNORMAL HIGH (ref 0.50–1.10)
GFR calc Af Amer: 36 mL/min — ABNORMAL LOW (ref >90)
GFR calc non Af Amer: 31 mL/min — ABNORMAL LOW (ref >90)

## 2012-10-07 MED ORDER — BIOTENE DRY MOUTH MT LIQD: 15.0000 mL | OROMUCOSAL | Status: DC | PRN

## 2012-10-07 MED ORDER — DILTIAZEM HCL ER COATED BEADS 240 MG PO CP24
240.0000 mg | ORAL_CAPSULE | Freq: Every day | ORAL | Status: DC
  Administered 2012-10-08: 240 mg via ORAL
  Filled 2012-10-07: qty 1

## 2012-10-07 MED ORDER — NITROPRUSSIDE SODIUM 25 MG/ML IV SOLN
0.2500 ug/kg/min | INTRAVENOUS | Status: DC
  Administered 2012-10-07 (×2): 0.5 ug/kg/min via INTRAVENOUS
  Administered 2012-10-08: 1 ug/kg/min via INTRAVENOUS
  Filled 2012-10-07 (×4): qty 4

## 2012-10-07 MED ORDER — HYDRALAZINE HCL 20 MG/ML IJ SOLN
10.0000 mg | Freq: Once | INTRAMUSCULAR | Status: AC
  Administered 2012-10-07: 10 mg via INTRAVENOUS

## 2012-10-07 NOTE — Progress Notes (Signed)
[facility]  Subjective  - POD #1, s/p L CEA  C/o numbness around face and neck Nauseated I&O cath last night   Physical Exam:  Neck incision soft, no hematoma Neuro exam unchanged.  Slightly weaker on left side, but good strength Moves feet easily.  Sensation intact     Assessment/Plan:  POD #1  Blood pressure:  Remains on nipride.  High variability from 140- 200.  Recommend SBP<160 to minimize risk of reperfusion injury OK to get oob Advance diet as tolerated  BRABHAM IV, V. WELLS 10/07/2012 8:08 AM --  Filed Vitals:   10/07/12 0715  BP:   Pulse: 105  Temp: 98.7 F (37.1 C)  Resp: 14    Intake/Output Summary (Last 24 hours) at 10/07/12 0808 Last data filed at 10/07/12 0700  Gross per 24 hour  Intake 4433.96 ml  Output    800 ml  Net 3633.96 ml     Laboratory CBC    Component Value Date/Time   WBC 11.8* 10/07/2012 0514   HGB 10.2* 10/07/2012 0514   HCT 29.6* 10/07/2012 0514   PLT 262 10/07/2012 0514    BMET    Component Value Date/Time   NA 136 10/07/2012 0514   K 3.8 10/07/2012 0514   CL 102 10/07/2012 0514   CO2 24 10/07/2012 0514   GLUCOSE 130* 10/07/2012 0514   BUN 20 10/07/2012 0514   CREATININE 1.69* 10/07/2012 0514   CALCIUM 7.7* 10/07/2012 0514   GFRNONAA 31* 10/07/2012 0514   GFRAA 36* 10/07/2012 0514    COAG Lab Results  Component Value Date   INR 1.00 10/06/2012   INR 0.94 10/03/2012   No results found for this basename: PTT    Antibiotics Anti-infectives   Start     Dose/Rate Route Frequency Ordered Stop   10/06/12 2200  cefUROXime (ZINACEF) 1.5 g in dextrose 5 % 50 mL IVPB     1.5 g 100 mL/hr over 30 Minutes Intravenous Every 12 hours 10/06/12 1719 10/07/12 2159   10/06/12 0600  [MAR Hold]  cefUROXime (ZINACEF) 1.5 g in dextrose 5 % 50 mL IVPB     (On MAR Hold since 10/06/12 0840)   1.5 g 100 mL/hr over 30 Minutes Intravenous On call to O.R. 10/05/12 1056 10/06/12 1015       V. Leia Alf, M.D. [facility] [place] Office: [phone] Pager:  [phone]

## 2012-10-07 NOTE — Progress Notes (Signed)
In and out cath done using sterile technique.  400 cc clear yellow urine.  Pt tolerated well.

## 2012-10-07 NOTE — Progress Notes (Signed)
Pt unable to void .  Has not voided since surgery.  Called and dr. Tonita Phoenix to place foley catheter .  Bladder scan showed 350 cc.

## 2012-10-07 NOTE — Progress Notes (Signed)
[facility] PROGRESS NOTE  Kelli [name] [id number] DOB: [birth year] DOA: 10/03/2012 PCP: Manon Hilding, MD  Assessment/Plan:  Principal Problem:   Acute CVA (cerebrovascular accident), bilateral watershed. No new deficits postop  Active Problems:   Occlusion and stenosis of carotid artery with cerebral infarction:  left carotid endarterectomy 8/8.  Occlusion of left subclavian artery    Hypertension: controlled on nipride gtt. Will increase dilitazem po to wean nipride    Obesity    Hyperglycemia hemoglobin A1c only 6.1, Will continue to monitor. Tobacco abuse, counseled against.     Other and unspecified hyperlipidemia: on statin    Renal insufficiency: creatinine up and dry mucous membranes. Increase IVF. Likely prerenal   Code Status: full Family Communication:  Disposition Plan: CIR once medically stable postop  Consultants:  Stroke team  Vascular surgery  Rehabilitation medicine  Procedures:  Left CEA 10/06/12  Antibiotics:  HPI/Subjective:  Some nausea. C/o dry mouth  Objective: Filed Vitals:   10/07/12 1200  BP:   Pulse: 97  Temp:   Resp: 18    Intake/Output Summary (Last 24 hours) at 10/07/12 1308 Last data filed at 10/07/12 1200  Gross per 24 hour  Intake 3322.36 ml  Output   1050 ml  Net 2272.36 ml   Filed Weights   10/04/12 0027 10/04/12 0131 10/06/12 0400  Weight: 89.359 kg (197 lb) 89.54 kg (197 lb 6.4 oz) 89.812 kg (198 lb)    Exam:   General:  More alert and talkative  HEENT. Dry MMM  Cardiovascular: Regular rate rhythm without murmurs gallops rub  Respiratory: Clear to auscultation bilaterally without wheezes rhonchi or rales  Abdomen: Soft nontender nondistended  Extremities: No edema. SCDs Neurologic: crainial nerves intact. Strength in arms and legs good  Data Reviewed: Basic Metabolic Panel:  Recent Labs Lab 10/01/12 1822 10/01/12 2119 10/03/12 1813 10/04/12 0520 10/06/12 0525 10/07/12 0514  NA  134*  --  134*  --  140 136  K 2.6* 3.0* 3.6  --  3.2* 3.8  CL 96  --  97  --  105 102  CO2 28  --  26  --  26 24  GLUCOSE 170*  --  143*  --  134* 130*  BUN 19  --  13  --  19 20  CREATININE 1.15*  --  1.14* 1.21* 1.27* 1.69*  CALCIUM 8.8  --  9.5  --  8.7 7.7*   Liver Function Tests:  Recent Labs Lab 10/01/12 1822 10/03/12 1813  AST 13 16  ALT 15 14  ALKPHOS 82 92  BILITOT 0.2* 0.3  PROT 6.9 7.4  ALBUMIN 3.2* 3.6   No results found for this basename: LIPASE, AMYLASE,  in the last 168 hours No results found for this basename: AMMONIA,  in the last 168 hours CBC:  Recent Labs Lab 10/01/12 1822 10/03/12 1813 10/04/12 0520 10/06/12 0525 10/07/12 0514  WBC 10.8* 12.8* 10.7* 8.4 11.8*  NEUTROABS 7.2 8.3*  --   --   --   HGB 12.2 14.2 12.5 12.2 10.2*  HCT 35.5* 40.1 36.7 36.0 29.6*  MCV 85.5 83.9 85.3 85.3 86.3  PLT 339 344 315 258 262   Cardiac Enzymes:  Recent Labs Lab 10/01/12 1822 10/03/12 1815  TROPONINI <0.30 <0.30   BNP (last 3 results) No results found for this basename: PROBNP,  in the last 8760 hours CBG:  Recent Labs Lab 10/05/12 2144 10/06/12 1630 10/06/12 2152 10/07/12 0711 10/07/12 1135  GLUCAP 115* 176* 156*  113* 134*    Recent Results (from the past 240 hour(s))  SURGICAL PCR SCREEN     Status: None   Collection Time    10/06/12  4:15 AM      Result Value Range Status   MRSA, PCR NEGATIVE  NEGATIVE Final   Staphylococcus aureus NEGATIVE  NEGATIVE Final   Comment:            The Xpert SA Assay (FDA     approved for NASAL specimens     in patients over 65 years of age),     is one component of     a comprehensive surveillance     program.  Test performance has     been validated by [employer] for patients greater     than or equal to 80 year old.     It is not intended     to diagnose infection nor to     guide or monitor treatment.     Studies: No results found.  Scheduled Meds: . aspirin EC  325 mg Oral Daily  .  diltiazem  180 mg Oral Daily  . docusate sodium  100 mg Oral Daily  . famotidine  20 mg Oral Daily  . potassium chloride SA  20 mEq Oral Daily  . simvastatin  10 mg Oral q1800   Continuous Infusions: . sodium chloride 75 mL/hr at 10/07/12 0700  . lactated ringers    . nitroPRUSSide 0.497 mcg/kg/min (10/07/12 1018)    Time spent: 30 min  [facility] Pager [phone]. If 7PM-7AM, please contact night-coverage at [email or web address], password [facility] 10/07/2012, 1:08 PM  LOS: 4 days

## 2012-10-07 NOTE — Progress Notes (Signed)
Stroke Team Progress Note  HISTORY Kelli [name] is a 62 y.o. female, right handed, with a past medical history significant for HTN, smoking, and fibromyalgia, sent from [facility] 10/03/2012 for further evaluation. MRI brain showed bi-cerebral strokes.    On 8/2 she started noticing some unusual fatigue associated with right arm numbness, poor balance, disorientation, and was confused to the point that she was not able to connect things. She also had blurred vision and a sensation of heaviness in both arms. She never had similar symptoms before and decided to go to to [facility] ED the next day where she had a CT brain that showed no acute intracranial abnormality. She was discharged from the ED 8/3.  Her symptoms persisted She presented to [facility] where an MRI revealed multiple ischemic infarcts in both cerebral hemispheres. She was transferred to [facility] where a CT brain was performed upon arrival to [facility] ED which disclosed multi focal acute/subacute ischemia throughout the cerebral hemispheres bilaterally, similar to recent brain MRI. She denied headache, vertigo, double vision, difficulty swallowing, slurred speech, language or vision impairment. No recent fever, infection, head or neck trauma.   Patient was not a TPA candidate secondary to delay in arrival from symptom onset. She was admitted for further evaluation and treatment.  SUBJECTIVE No family members present. She reports vision still somewhat blurred. Feels worn out after left carotid endarterectomy yesterday. Sore throat and dry moth otherwise no complaints.   OBJECTIVE Most recent Vital Signs: Filed Vitals:   10/07/12 0900 10/07/12 1000 10/07/12 1100 10/07/12 1137  BP:  180/67    Pulse: 104 97 99   Temp:    98.5 F (36.9 C)  TempSrc:    Oral  Resp: 17 15 21    Height:      Weight:      SpO2: 96% 96% 98%    CBG (last 3)   Recent Labs  10/06/12 1630 10/06/12 2152 10/07/12 0711  GLUCAP 176* 156* 113*    IV Fluid  Intake:   . sodium chloride 75 mL/hr at 10/07/12 0700  . lactated ringers    . nitroPRUSSide 0.497 mcg/kg/min (10/07/12 1018)    MEDICATIONS  . aspirin EC  325 mg Oral Daily  . diltiazem  180 mg Oral Daily  . docusate sodium  100 mg Oral Daily  . famotidine  20 mg Oral Daily  . potassium chloride SA  20 mEq Oral Daily  . simvastatin  10 mg Oral q1800   PRN:  acetaminophen, acetaminophen, albuterol, clonazePAM, guaiFENesin-dextromethorphan, hydrALAZINE, labetalol, metoprolol, morphine injection, ondansetron, oxyCODONE-acetaminophen, phenol  Diet:  Carb Control thin liquids Activity:  Bathroom privileges with assistance DVT Prophylaxis:  Lovenox 40 mg sq daily   CLINICALLY SIGNIFICANT STUDIES Basic Metabolic Panel:   Recent Labs Lab 10/06/12 0525 10/07/12 0514  NA 140 136  K 3.2* 3.8  CL 105 102  CO2 26 24  GLUCOSE 134* 130*  BUN 19 20  CREATININE 1.27* 1.69*  CALCIUM 8.7 7.7*   Liver Function Tests:   Recent Labs Lab 10/01/12 1822 10/03/12 1813  AST 13 16  ALT 15 14  ALKPHOS 82 92  BILITOT 0.2* 0.3  PROT 6.9 7.4  ALBUMIN 3.2* 3.6   CBC:   Recent Labs Lab 10/01/12 1822 10/03/12 1813  10/06/12 0525 10/07/12 0514  WBC 10.8* 12.8*  < > 8.4 11.8*  NEUTROABS 7.2 8.3*  --   --   --   HGB 12.2 14.2  < > 12.2 10.2*  HCT  35.5* 40.1  < > 36.0 29.6*  MCV 85.5 83.9  < > 85.3 86.3  PLT 339 344  < > 258 262  < > = values in this interval not displayed. Coagulation:   Recent Labs Lab 10/03/12 1813 10/06/12 0525  LABPROT 12.4 13.0  INR 0.94 1.00   Cardiac Enzymes:   Recent Labs Lab 10/01/12 1822 10/03/12 1815  TROPONINI <0.30 <0.30   Urinalysis:   Recent Labs Lab 10/01/12 1829  COLORURINE YELLOW  LABSPEC 1.025  PHURINE 7.0  GLUCOSEU NEGATIVE  HGBUR SMALL*  BILIRUBINUR NEGATIVE  KETONESUR NEGATIVE  PROTEINUR >300*  UROBILINOGEN 0.2  NITRITE NEGATIVE  LEUKOCYTESUR NEGATIVE   Lipid Panel    Component Value Date/Time   CHOL 211* 10/05/2012  0027   TRIG 302* 10/05/2012 0027   HDL 30* 10/05/2012 0027   CHOLHDL 7.0 10/05/2012 0027   VLDL 60* 10/05/2012 0027   LDLCALC 121* 10/05/2012 0027   HgbA1C  Lab Results  Component Value Date   HGBA1C 6.1* 10/04/2012    Urine Drug Screen:      Component Value Date/Time   LABOPIA NONE DETECTED 10/05/2012 0011    Alcohol Level: No results found for this basename: ETH,  in the last 168 hours  CT of the brain   10/03/2012  1.  Multi focal acute/subacute ischemia throughout the cerebral hemispheres bilaterally, similar to recent brain MRI 10/03/2012.  10/01/2012 (at [facility]) Chronic microvascular ischemic changes in the white matter. No acute process by noncontrast CT.   MRI of the brain  (done at [facility]) Bilateral watershed infarcts  MRA of the brain  10/04/2012  Occluded right internal carotid artery.  The right anterior and middle cerebral arteries are supplied by the anterior communicating artery.  There is probable atherosclerotic disease in the middle cerebral arteries bilaterally.  There is a moderate stenosis of the proximal left posterior cerebral artery.   2D Echocardiogram  EF 55-60% with no source of embolus.   Carotid Doppler  10/04/2012 Right: Occluded internal carotid artery. Left: Greater than 80% internal carotid artery stenosis. Right: Vertebral artery flow is antegrade. Left: Vertebral artery retrograde.   CXR  10/01/2012 Mild hyperinflation and chronic bronchitic changes, suspect COPD/emphysema No superimposed CHF or pneumonia  EKG  normal sinus rhythm.   Therapy Recommendations CIR  Physical Exam   Pleasant middle-aged Caucasian lady currently not in distress. Awake alert. Afebrile. Head is nontraumatic. Neck is supple without bruit. Hearing is normal. Cardiac exam no murmur.  Neurological Exam :  Awake alert with normal speech and language function. Vision fields appear normal. Mild left lower facial weakness. Tongue is midline. HOARSE VOICE. Motor system exam reveals left upper  extremity drift and weakness in the left grip and intrinsic hand muscles. Orbits right over left upper extremity. Symmetric lower extremity strength. Sensation is intact. Gait was not tested.   ASSESSMENT Ms. Kelli [name] is a 62 y.o. female presenting with unsteadiness, right upper extremity numbness, heaviness bilateral UE, disorientation. Imaging confirms bilateral watershed infarcts. Infarcts felt to be thromboembolic secondary to carotid stenosis - carotid dopplers confirm  occlusion of right ICA and > 80% left ICA stenosis.  On no antithrombotic prior to admission. Now on aspirin 325 mg orally every day for secondary stroke prevention.   Hypertension  Hyperlipidemia, LDL 126, on no statin PTA, now on statin, goal LDL < 100  - Now on Zocor. Fibromyalgia Cigarette smoker  Hospital day # 4  TREATMENT/PLAN  Continue  aspirin 325 mg orally  every day for secondary stroke prevention.  L CEA yesterday. POD #1.  Plan transfer to rehab post CEA   Risk factor modification. - smoking cessation.   Mikey Bussing PA-C [facility] Pager [phone] 10/07/2012, 12:02 PM  I evaluated and examined patient, reviewed records, labs and imaging, and agree with note and plan.  Penni Bombard, MD [id number], [id number] PM Certified in Neurology, Neurophysiology and Neuroimaging [facility] - Stroke Team  Please refer to [email or web address] for on-call Stroke MD

## 2012-10-08 LAB — BASIC METABOLIC PANEL
CO2: 22 mEq/L (ref 19–32)
Calcium: 7.7 mg/dL — ABNORMAL LOW (ref 8.4–10.5)
Chloride: 103 mEq/L (ref 96–112)
Glucose, Bld: 153 mg/dL — ABNORMAL HIGH (ref 70–99)
Potassium: 3.7 mEq/L (ref 3.5–5.1)
Sodium: 133 mEq/L — ABNORMAL LOW (ref 135–145)

## 2012-10-08 LAB — CBC
Hemoglobin: 9.3 g/dL — ABNORMAL LOW (ref 12.0–15.0)
MCV: 86.7 fL (ref 78.0–100.0)
Platelets: 268 10*3/uL (ref 150–400)
RBC: 3.15 MIL/uL — ABNORMAL LOW (ref 3.87–5.11)
WBC: 10.5 10*3/uL (ref 4.0–10.5)

## 2012-10-08 LAB — GLUCOSE, CAPILLARY: Glucose-Capillary: 162 mg/dL — ABNORMAL HIGH (ref 70–99)

## 2012-10-08 MED ORDER — AMLODIPINE BESYLATE 5 MG PO TABS
5.0000 mg | ORAL_TABLET | Freq: Every day | ORAL | Status: DC
  Administered 2012-10-08: 5 mg via ORAL
  Filled 2012-10-08 (×2): qty 1

## 2012-10-08 MED ORDER — CLONIDINE HCL 0.1 MG PO TABS
0.1000 mg | ORAL_TABLET | Freq: Three times a day (TID) | ORAL | Status: DC
  Administered 2012-10-08 – 2012-10-09 (×3): 0.1 mg via ORAL
  Filled 2012-10-08 (×5): qty 1

## 2012-10-08 MED ORDER — DILTIAZEM HCL ER COATED BEADS 360 MG PO CP24
360.0000 mg | ORAL_CAPSULE | Freq: Every day | ORAL | Status: DC
  Filled 2012-10-08: qty 1

## 2012-10-08 NOTE — Progress Notes (Signed)
Stroke Team Progress Note  HISTORY Kelli [name] is a 62 y.o. female, right handed, with a past medical history significant for HTN, smoking, and fibromyalgia, sent from [facility] 10/03/2012 for further evaluation. MRI brain showed bi-cerebral strokes.    On 8/2 she started noticing some unusual fatigue associated with right arm numbness, poor balance, disorientation, and was confused to the point that she was not able to connect things. She also had blurred vision and a sensation of heaviness in both arms. She never had similar symptoms before and decided to go to to [facility] ED the next day where she had a CT brain that showed no acute intracranial abnormality. She was discharged from the ED 8/3.  Her symptoms persisted She presented to [facility] where an MRI revealed multiple ischemic infarcts in both cerebral hemispheres. She was transferred to [facility] where a CT brain was performed upon arrival to [facility] ED which disclosed multi focal acute/subacute ischemia throughout the cerebral hemispheres bilaterally, similar to recent brain MRI. She denied headache, vertigo, double vision, difficulty swallowing, slurred speech, language or vision impairment. No recent fever, infection, head or neck trauma.   Patient was not a TPA candidate secondary to delay in arrival from symptom onset. She was admitted for further evaluation and treatment.  SUBJECTIVE Kelli [name] at bedside. Patient had "rough" night (couldn't sleep, pain). Trying to rest today. No new symptoms.   OBJECTIVE Most recent Vital Signs: Filed Vitals:   10/08/12 1100 10/08/12 1121 10/08/12 1200 10/08/12 1217  BP: 166/57  184/56 142/52  Pulse: 101  114   Temp:  98.4 F (36.9 C)    TempSrc:  Oral    Resp: 14  19   Height:      Weight:      SpO2: 94%  95%    CBG (last 3)   Recent Labs  10/07/12 1935 10/08/12 0718 10/08/12 1119  GLUCAP 164* 156* 162*    IV Fluid Intake:   . sodium chloride 100 mL/hr (10/07/12 1310)  . nitroPRUSSide  1 mcg/kg/min (10/08/12 1200)    MEDICATIONS  . aspirin EC  325 mg Oral Daily  . diltiazem  240 mg Oral Daily  . docusate sodium  100 mg Oral Daily  . famotidine  20 mg Oral Daily  . potassium chloride SA  20 mEq Oral Daily  . simvastatin  10 mg Oral q1800   PRN:  acetaminophen, acetaminophen, albuterol, antiseptic oral rinse, clonazePAM, guaiFENesin-dextromethorphan, hydrALAZINE, labetalol, morphine injection, ondansetron, oxyCODONE-acetaminophen, phenol  Diet:  Carb Control thin liquids Activity:  Bathroom privileges with assistance DVT Prophylaxis:  Lovenox 40 mg sq daily   CLINICALLY SIGNIFICANT STUDIES Basic Metabolic Panel:   Recent Labs Lab 10/07/12 0514 10/08/12 0320  NA 136 133*  K 3.8 3.7  CL 102 103  CO2 24 22  GLUCOSE 130* 153*  BUN 20 17  CREATININE 1.69* 1.49*  CALCIUM 7.7* 7.7*   Liver Function Tests:   Recent Labs Lab 10/01/12 1822 10/03/12 1813  AST 13 16  ALT 15 14  ALKPHOS 82 92  BILITOT 0.2* 0.3  PROT 6.9 7.4  ALBUMIN 3.2* 3.6   CBC:   Recent Labs Lab 10/01/12 1822 10/03/12 1813  10/07/12 0514 10/08/12 0320  WBC 10.8* 12.8*  < > 11.8* 10.5  NEUTROABS 7.2 8.3*  --   --   --   HGB 12.2 14.2  < > 10.2* 9.3*  HCT 35.5* 40.1  < > 29.6* 27.3*  MCV 85.5 83.9  < >  86.3 86.7  PLT 339 344  < > 262 268  < > = values in this interval not displayed. Coagulation:   Recent Labs Lab 10/03/12 1813 10/06/12 0525  LABPROT 12.4 13.0  INR 0.94 1.00   Cardiac Enzymes:   Recent Labs Lab 10/01/12 1822 10/03/12 1815  TROPONINI <0.30 <0.30   Urinalysis:   Recent Labs Lab 10/01/12 1829  COLORURINE YELLOW  LABSPEC 1.025  PHURINE 7.0  GLUCOSEU NEGATIVE  HGBUR SMALL*  BILIRUBINUR NEGATIVE  KETONESUR NEGATIVE  PROTEINUR >300*  UROBILINOGEN 0.2  NITRITE NEGATIVE  LEUKOCYTESUR NEGATIVE   Lipid Panel    Component Value Date/Time   CHOL 211* 10/05/2012 0027   TRIG 302* 10/05/2012 0027   HDL 30* 10/05/2012 0027   CHOLHDL 7.0 10/05/2012 0027    VLDL 60* 10/05/2012 0027   LDLCALC 121* 10/05/2012 0027   HgbA1C  Lab Results  Component Value Date   HGBA1C 6.1* 10/04/2012    Urine Drug Screen:      Component Value Date/Time   LABOPIA NONE DETECTED 10/05/2012 0011    Alcohol Level: No results found for this basename: ETH,  in the last 168 hours  CT of the brain   10/03/2012  1.  Multi focal acute/subacute ischemia throughout the cerebral hemispheres bilaterally, similar to recent brain MRI 10/03/2012.  10/01/2012 (at [facility]) Chronic microvascular ischemic changes in the white matter. No acute process by noncontrast CT.   MRI of the brain  (done at [facility]) Bilateral watershed infarcts  MRA of the brain  10/04/2012  Occluded right internal carotid artery.  The right anterior and middle cerebral arteries are supplied by the anterior communicating artery.  There is probable atherosclerotic disease in the middle cerebral arteries bilaterally.  There is a moderate stenosis of the proximal left posterior cerebral artery.   2D Echocardiogram  EF 55-60% with no source of embolus.   Carotid Doppler  10/04/2012 Right: Occluded internal carotid artery. Left: Greater than 80% internal carotid artery stenosis. Right: Vertebral artery flow is antegrade. Left: Vertebral artery retrograde.   CXR  10/01/2012 Mild hyperinflation and chronic bronchitic changes, suspect COPD/emphysema No superimposed CHF or pneumonia  EKG  normal sinus rhythm.   Therapy Recommendations CIR  Physical Exam   Pleasant middle-aged Caucasian lady currently not in distress. Awake alert. Afebrile. Head is nontraumatic. Neck is supple without bruit. Hearing is normal. Cardiac exam no murmur.  Neurological Exam :  Awake alert with normal speech and language function. Vision fields appear normal. Mild left lower facial weakness. Tongue is midline. HOARSE VOICE. Motor system exam reveals bilateral (left greater than right) upper extremity drift and weakness in the left grip and  intrinsic hand muscles. Symmetric lower extremity strength. Sensation is intact. Gait was not tested.   ASSESSMENT Ms. Kelli [name] is a 62 y.o. female presenting with unsteadiness, right upper extremity numbness, heaviness bilateral UE, disorientation. Imaging confirms bilateral watershed infarcts. Infarcts felt to be thromboembolic secondary to carotid stenosis - carotid dopplers confirm  occlusion of right ICA and > 80% left ICA stenosis.  On no antithrombotic prior to admission. Now on aspirin 325 mg orally every day for secondary stroke prevention.   Hypertension  Hyperlipidemia, LDL 126, on no statin PTA, now on statin, goal LDL < 100  - Now on Zocor. Fibromyalgia Cigarette smoker  Hospital day # 5  TREATMENT/PLAN  Continue  aspirin 325 mg orally every day for secondary stroke prevention.  S/p Left CEA. POD #2.  Plan transfer to  rehab post CEA   Risk factor modification. - smoking cessation.    Penni Bombard, MD [id number], [id number] PM Certified in Neurology, Neurophysiology and Neuroimaging [facility] - Stroke Team  Please refer to [email or web address] for on-call Stroke MD

## 2012-10-08 NOTE — Progress Notes (Signed)
[facility] PROGRESS NOTE  Kelli [name] [id number] DOB: 1951/01/30 DOA: 10/03/2012 PCP: Manon Hilding, MD  Assessment/Plan:  Principal Problem:   Acute CVA (cerebrovascular accident), bilateral watershed. No new deficits postop  Active Problems:   Occlusion and stenosis of carotid artery with cerebral infarction:  left carotid endarterectomy 8/8.  Occlusion of left subclavian artery    Hypertension: Still on nipride gtt. Will increase diltiazem to home dose. Add clonidine. And decrease IV to [facility]    Obesity    Hyperglycemia hemoglobin A1c only 6.1, Will continue to monitor.   Tobacco abuse, counseled against.     Other and unspecified hyperlipidemia: on statin    Renal insufficiency: creatinine improved.   Code Status: full Family Communication:  Disposition Plan: CIR once medically stable postop  Consultants:  Stroke team  Vascular surgery  Rehabilitation medicine  Procedures:  Left CEA 10/06/12  Antibiotics:  HPI/Subjective: Feels fatigued.  Some nausea. Per RN ate a bit of breakfast  Objective: Filed Vitals:   10/08/12 1400  BP: 208/77  Pulse: 106  Temp:   Resp: 20    Intake/Output Summary (Last 24 hours) at 10/08/12 1418 Last data filed at 10/08/12 1400  Gross per 24 hour  Intake 3070.7 ml  Output   1525 ml  Net 1545.7 ml   Filed Weights   10/04/12 0027 10/04/12 0131 10/06/12 0400  Weight: 89.359 kg (197 lb) 89.54 kg (197 lb 6.4 oz) 89.812 kg (198 lb)    Exam:   General:  In chair  HEENT. Mucous membranes less dry  Cardiovascular: Regular rate rhythm without murmurs gallops rub  Respiratory: Clear to auscultation bilaterally without wheezes rhonchi or rales  Abdomen: Soft nontender nondistended  Extremities: No edema. SCDs Neurologic: crainial nerves intact. Strength in arms and legs good  Data Reviewed: Basic Metabolic Panel:  Recent Labs Lab 10/01/12 1822 10/01/12 2119 10/03/12 1813 10/04/12 0520 10/06/12 0525  10/07/12 0514 10/08/12 0320  NA 134*  --  134*  --  140 136 133*  K 2.6* 3.0* 3.6  --  3.2* 3.8 3.7  CL 96  --  97  --  105 102 103  CO2 28  --  26  --  26 24 22   GLUCOSE 170*  --  143*  --  134* 130* 153*  BUN 19  --  13  --  19 20 17   CREATININE 1.15*  --  1.14* 1.21* 1.27* 1.69* 1.49*  CALCIUM 8.8  --  9.5  --  8.7 7.7* 7.7*   Liver Function Tests:  Recent Labs Lab 10/01/12 1822 10/03/12 1813  AST 13 16  ALT 15 14  ALKPHOS 82 92  BILITOT 0.2* 0.3  PROT 6.9 7.4  ALBUMIN 3.2* 3.6   No results found for this basename: LIPASE, AMYLASE,  in the last 168 hours No results found for this basename: AMMONIA,  in the last 168 hours CBC:  Recent Labs Lab 10/01/12 1822 10/03/12 1813 10/04/12 0520 10/06/12 0525 10/07/12 0514 10/08/12 0320  WBC 10.8* 12.8* 10.7* 8.4 11.8* 10.5  NEUTROABS 7.2 8.3*  --   --   --   --   HGB 12.2 14.2 12.5 12.2 10.2* 9.3*  HCT 35.5* 40.1 36.7 36.0 29.6* 27.3*  MCV 85.5 83.9 85.3 85.3 86.3 86.7  PLT 339 344 315 258 262 268   Cardiac Enzymes:  Recent Labs Lab 10/01/12 1822 10/03/12 1815  TROPONINI <0.30 <0.30   BNP (last 3 results) No results found for this basename: PROBNP,  in the last 8760 hours CBG:  Recent Labs Lab 10/07/12 0711 10/07/12 1135 10/07/12 1935 10/08/12 0718 10/08/12 1119  GLUCAP 113* 134* 164* 156* 162*    Recent Results (from the past 240 hour(s))  SURGICAL PCR SCREEN     Status: None   Collection Time    10/06/12  4:15 AM      Result Value Range Status   MRSA, PCR NEGATIVE  NEGATIVE Final   Staphylococcus aureus NEGATIVE  NEGATIVE Final   Comment:            The Xpert SA Assay (FDA     approved for NASAL specimens     in patients over 40 years of age),     is one component of     a comprehensive surveillance     program.  Test performance has     been validated by [employer] for patients greater     than or equal to 38 year old.     It is not intended     to diagnose infection nor to      guide or monitor treatment.     Studies: No results found.  Scheduled Meds: . aspirin EC  325 mg Oral Daily  . diltiazem  240 mg Oral Daily  . docusate sodium  100 mg Oral Daily  . famotidine  20 mg Oral Daily  . potassium chloride SA  20 mEq Oral Daily  . simvastatin  10 mg Oral q1800   Continuous Infusions: . sodium chloride 100 mL/hr (10/07/12 1310)  . nitroPRUSSide 0.8 mcg/kg/min (10/08/12 1400)    Time spent: 35 min  [facility] Pager [phone]. If 7PM-7AM, please contact night-coverage at [email or web address], password [facility] 10/08/2012, 2:18 PM  LOS: 5 days

## 2012-10-08 NOTE — Progress Notes (Addendum)
VASCULAR AND VEIN SURGERY POST - OP CEA PROGRESS NOTE  Date of Surgery: 10/03/2012 - 10/06/2012  Surgeon(s): [name], MD 2 Days Post-Op left CEA .  HPI: Kelli [name] is a 62 y.o. female who is 2 Days Post-Op . Patient is doing well. Pre-operative symptoms are stable with some decrease strength in left arm with good grip Patient reports headache; less than yesterday Patient denies difficulty swallowing; reports weakness in upper or lower extremities;   Significant Diagnostic Studies: CBC Lab Results  Component Value Date   WBC 10.5 10/08/2012   HGB 9.3* 10/08/2012   HCT 27.3* 10/08/2012   MCV 86.7 10/08/2012   PLT 268 10/08/2012    BMET    Component Value Date/Time   NA 133* 10/08/2012 0320   K 3.7 10/08/2012 0320   CL 103 10/08/2012 0320   CO2 22 10/08/2012 0320   GLUCOSE 153* 10/08/2012 0320   BUN 17 10/08/2012 0320   CREATININE 1.49* 10/08/2012 0320   CALCIUM 7.7* 10/08/2012 0320   GFRNONAA 36* 10/08/2012 0320   GFRAA 42* 10/08/2012 0320    COAG Lab Results  Component Value Date   INR 1.00 10/06/2012   INR 0.94 10/03/2012   No results found for this basename: PTT      Intake/Output Summary (Last 24 hours) at 10/08/12 0825 Last data filed at 10/08/12 0600  Gross per 24 hour  Intake 2863.3 ml  Output   1575 ml  Net 1288.3 ml    Physical Exam:  BP Readings from Last 3 Encounters:  10/08/12 154/53  10/08/12 154/53  10/01/12 152/90   Temp Readings from Last 3 Encounters:  10/08/12 99 F (37.2 C) Oral  10/08/12 99 F (37.2 C) Oral  10/01/12 98.8 F (37.1 C) Oral   SpO2 Readings from Last 3 Encounters:  10/08/12 91%  10/08/12 91%  10/01/12 97%   Pulse Readings from Last 3 Encounters:  10/08/12 103  10/08/12 103  10/01/12 89    Pt is A&O x 3 Gait is normal - better strength in LE getting OOB Speech is fluent left Neck Wound is healing well Patient with Negative tongue deviation and Negative facial droop Pt has good and equal grip in upper  extremities  Assessment/Plan:: Kelli [name] is a 62 y.o. female is S/P Left CEA Pt is voiding, ambulating and taking po well  pre-op neuro symptoms stable with weakness in left arm Post-op H/A sec to reperfusion and nipride Severe HTN - IM MANAGING- BETTER CONTROL WITH INCREASED DILTIAZEM   ROCZNIAK,REGINA J  10/08/2012 8:25 AM  I agree with the above. The patient's neuro examination remain stable. Her incision is healing nicely. Her nitrite drip is being weaned, with the addition of oral medications.  Annamarie Major

## 2012-10-09 ENCOUNTER — Encounter (HOSPITAL_COMMUNITY): Payer: Self-pay | Admitting: Vascular Surgery

## 2012-10-09 ENCOUNTER — Inpatient Hospital Stay (HOSPITAL_COMMUNITY)
Admission: RE | Admit: 2012-10-09 | Discharge: 2012-10-19 | DRG: 945 | Disposition: A | Discharge status: Home / Self Care | Payer: 59 | Source: Intra-hospital | Attending: Physical Medicine & Rehabilitation | Admitting: Physical Medicine & Rehabilitation

## 2012-10-09 DIAGNOSIS — Z9889 Other specified postprocedural states: Secondary | ICD-10-CM

## 2012-10-09 DIAGNOSIS — I6350 Cerebral infarction due to unspecified occlusion or stenosis of unspecified cerebral artery: Secondary | ICD-10-CM

## 2012-10-09 DIAGNOSIS — I639 Cerebral infarction, unspecified: Secondary | ICD-10-CM

## 2012-10-09 DIAGNOSIS — G8110 Spastic hemiplegia affecting unspecified side: Secondary | ICD-10-CM

## 2012-10-09 DIAGNOSIS — N189 Chronic kidney disease, unspecified: Secondary | ICD-10-CM

## 2012-10-09 DIAGNOSIS — R350 Frequency of micturition: Secondary | ICD-10-CM

## 2012-10-09 DIAGNOSIS — F17200 Nicotine dependence, unspecified, uncomplicated: Secondary | ICD-10-CM

## 2012-10-09 DIAGNOSIS — I6330 Cerebral infarction due to thrombosis of unspecified cerebral artery: Secondary | ICD-10-CM

## 2012-10-09 DIAGNOSIS — K5900 Constipation, unspecified: Secondary | ICD-10-CM

## 2012-10-09 DIAGNOSIS — I6320 Cerebral infarction due to unspecified occlusion or stenosis of unspecified precerebral arteries: Secondary | ICD-10-CM

## 2012-10-09 DIAGNOSIS — Z5189 Encounter for other specified aftercare: Principal | ICD-10-CM

## 2012-10-09 DIAGNOSIS — F411 Generalized anxiety disorder: Secondary | ICD-10-CM

## 2012-10-09 DIAGNOSIS — R7309 Other abnormal glucose: Secondary | ICD-10-CM

## 2012-10-09 DIAGNOSIS — I129 Hypertensive chronic kidney disease with stage 1 through stage 4 chronic kidney disease, or unspecified chronic kidney disease: Secondary | ICD-10-CM

## 2012-10-09 DIAGNOSIS — IMO0001 Reserved for inherently not codable concepts without codable children: Secondary | ICD-10-CM

## 2012-10-09 DIAGNOSIS — E785 Hyperlipidemia, unspecified: Secondary | ICD-10-CM

## 2012-10-09 DIAGNOSIS — Z8673 Personal history of transient ischemic attack (TIA), and cerebral infarction without residual deficits: Secondary | ICD-10-CM

## 2012-10-09 DIAGNOSIS — I63239 Cerebral infarction due to unspecified occlusion or stenosis of unspecified carotid arteries: Secondary | ICD-10-CM

## 2012-10-09 DIAGNOSIS — G4700 Insomnia, unspecified: Secondary | ICD-10-CM

## 2012-10-09 LAB — CBC
MCH: 29.7 pg (ref 26.0–34.0)
MCV: 87.1 fL (ref 78.0–100.0)
Platelets: 259 10*3/uL (ref 150–400)
RDW: 14.5 % (ref 11.5–15.5)

## 2012-10-09 LAB — BASIC METABOLIC PANEL
CO2: 21 mEq/L (ref 19–32)
Calcium: 8.4 mg/dL (ref 8.4–10.5)
Creatinine, Ser: 1.32 mg/dL — ABNORMAL HIGH (ref 0.50–1.10)
GFR calc non Af Amer: 42 mL/min — ABNORMAL LOW (ref >90)
Glucose, Bld: 138 mg/dL — ABNORMAL HIGH (ref 70–99)

## 2012-10-09 LAB — GLUCOSE, CAPILLARY: Glucose-Capillary: 129 mg/dL — ABNORMAL HIGH (ref 70–99)

## 2012-10-09 MED ORDER — FAMOTIDINE 20 MG PO TABS
20.0000 mg | ORAL_TABLET | Freq: Every day | ORAL | Status: DC
  Administered 2012-10-10 – 2012-10-19 (×10): 20 mg via ORAL
  Filled 2012-10-09 (×11): qty 1

## 2012-10-09 MED ORDER — ONDANSETRON HCL 4 MG PO TABS
4.0000 mg | ORAL_TABLET | Freq: Four times a day (QID) | ORAL | Status: DC | PRN
  Administered 2012-10-12 (×2): 4 mg via ORAL
  Filled 2012-10-09 (×2): qty 1

## 2012-10-09 MED ORDER — BIOTENE DRY MOUTH MT LIQD: 15.0000 mL | OROMUCOSAL | Status: DC | PRN

## 2012-10-09 MED ORDER — AMLODIPINE BESYLATE 10 MG PO TABS
10.0000 mg | ORAL_TABLET | Freq: Every day | ORAL | Status: DC
  Administered 2012-10-09: 10 mg via ORAL
  Filled 2012-10-09 (×2): qty 1

## 2012-10-09 MED ORDER — SORBITOL 70 % SOLN
30.0000 mL | Freq: Every day | Status: DC | PRN
  Administered 2012-10-11 – 2012-10-15 (×2): 30 mL via ORAL
  Filled 2012-10-09 (×2): qty 30

## 2012-10-09 MED ORDER — OXYCODONE-ACETAMINOPHEN 5-325 MG PO TABS
1.0000 | ORAL_TABLET | ORAL | Status: DC | PRN
Start: 2012-10-09 — End: 2012-10-19
  Administered 2012-10-10: 2 via ORAL
  Administered 2012-10-10 – 2012-10-11 (×6): 1 via ORAL
  Administered 2012-10-12 (×2): 2 via ORAL
  Filled 2012-10-09 (×3): qty 1
  Filled 2012-10-09 (×2): qty 2
  Filled 2012-10-09 (×2): qty 1
  Filled 2012-10-09: qty 2
  Filled 2012-10-09 (×3): qty 1

## 2012-10-09 MED ORDER — TRAZODONE HCL 50 MG PO TABS
25.0000 mg | ORAL_TABLET | Freq: Every evening | ORAL | Status: DC | PRN
  Administered 2012-10-09 – 2012-10-13 (×5): 50 mg via ORAL
  Filled 2012-10-09 (×5): qty 1

## 2012-10-09 MED ORDER — SENNOSIDES-DOCUSATE SODIUM 8.6-50 MG PO TABS
1.0000 | ORAL_TABLET | Freq: Every evening | ORAL | Status: DC | PRN
  Filled 2012-10-09: qty 1

## 2012-10-09 MED ORDER — CLONAZEPAM 0.5 MG PO TABS: 0.5000 mg | ORAL_TABLET | Freq: Two times a day (BID) | ORAL | Status: DC | PRN

## 2012-10-09 MED ORDER — ASPIRIN EC 325 MG PO TBEC
325.0000 mg | DELAYED_RELEASE_TABLET | Freq: Every day | ORAL | Status: DC
  Administered 2012-10-10 – 2012-10-19 (×10): 325 mg via ORAL
  Filled 2012-10-09 (×12): qty 1

## 2012-10-09 MED ORDER — CLONIDINE HCL 0.1 MG PO TABS
0.1000 mg | ORAL_TABLET | Freq: Three times a day (TID) | ORAL | Status: DC
  Administered 2012-10-09 – 2012-10-13 (×12): 0.1 mg via ORAL
  Filled 2012-10-09 (×15): qty 1

## 2012-10-09 MED ORDER — PHENOL 1.4 % MT LIQD
1.0000 | OROMUCOSAL | Status: DC | PRN
  Filled 2012-10-09: qty 177

## 2012-10-09 MED ORDER — AMLODIPINE BESYLATE 10 MG PO TABS
10.0000 mg | ORAL_TABLET | Freq: Every day | ORAL | Status: DC
  Administered 2012-10-10 – 2012-10-13 (×4): 10 mg via ORAL
  Filled 2012-10-09 (×5): qty 1

## 2012-10-09 MED ORDER — ACETAMINOPHEN 325 MG PO TABS
325.0000 mg | ORAL_TABLET | ORAL | Status: DC | PRN
  Administered 2012-10-10 – 2012-10-18 (×3): 650 mg via ORAL
  Filled 2012-10-09 (×5): qty 2

## 2012-10-09 MED ORDER — ALBUTEROL SULFATE (5 MG/ML) 0.5% IN NEBU: 2.5000 mg | INHALATION_SOLUTION | Freq: Four times a day (QID) | RESPIRATORY_TRACT | Status: DC | PRN

## 2012-10-09 MED ORDER — DILTIAZEM HCL ER COATED BEADS 360 MG PO CP24
360.0000 mg | ORAL_CAPSULE | Freq: Every day | ORAL | Status: DC
  Administered 2012-10-09: 360 mg via ORAL
  Filled 2012-10-09 (×2): qty 1

## 2012-10-09 MED ORDER — DILTIAZEM HCL ER COATED BEADS 360 MG PO CP24
360.0000 mg | ORAL_CAPSULE | Freq: Every day | ORAL | Status: DC
  Administered 2012-10-10 – 2012-10-19 (×10): 360 mg via ORAL
  Filled 2012-10-09 (×12): qty 1

## 2012-10-09 MED ORDER — AMLODIPINE BESYLATE 10 MG PO TABS
10.0000 mg | ORAL_TABLET | Freq: Every day | ORAL | Status: DC
  Filled 2012-10-09: qty 1

## 2012-10-09 MED ORDER — SIMVASTATIN 10 MG PO TABS
10.0000 mg | ORAL_TABLET | Freq: Every day | ORAL | Status: DC
  Administered 2012-10-10 – 2012-10-18 (×9): 10 mg via ORAL
  Filled 2012-10-09 (×11): qty 1

## 2012-10-09 MED ORDER — ONDANSETRON HCL 4 MG/2ML IJ SOLN: 4.0000 mg | Freq: Four times a day (QID) | INTRAMUSCULAR | Status: DC | PRN

## 2012-10-09 NOTE — Progress Notes (Addendum)
Physical Therapy Treatment Patient Details Name: Kelli [name] MRN: [id number] DOB: Oct 10, 1950 Today's Date: 10/09/2012 Time: [id number] PT Time Calculation (min): 35 min  PT Assessment / Plan / Recommendation  History of Present Illness Kelli [name] is an 62 y.o. female, right handed, with a past medical history significant for HTN, smoking, fibromyalgia, sent from [facility] 10/03/2012 for further evaluation. MRI brain showed bi-cerebral strokes.   PT Comments   Patient continues to demonstrate deficits in functional mobility compounded by decreased spatial awareness and delayed processing. Patient with inconsistent attention level throughout session. Feel patient will benefit from intense skilled PT in CIR setting to maximize functional return and progress physical abilities towards independence.    Follow Up Recommendations  CIR     Does the patient have the potential to tolerate intense rehabilitation   Yes     Equipment Recommendations  Other (comment) (TBA)    Recommendations for Other Services Rehab consult  Frequency Min 4X/week   Progress towards PT Goals Progress towards PT goals: Progressing toward goals  Plan Current plan remains appropriate    Precautions / Restrictions Precautions Precautions: Fall Precaution Comments: poor visuospatial awareness   Pertinent Vitals/Pain No pain, just increased discomfort in UEs secondary to edema    Mobility  Bed Mobility Bed Mobility: Supine to Sit Supine to Sit: 5: Supervision;HOB flat Details for Bed Mobility Assistance: for safety Transfers Transfers: Sit to Stand;Stand to Sit Sit to Stand: 4: Min guard;From bed Stand to Sit: 4: Min guard;To bed Details for Transfer Assistance: for safety with decreased awareness Ambulation/Gait Ambulation/Gait Assistance: 4: Min assist Ambulation Distance (Feet): 30 Feet Assistive device: 1 person hand held assist Gait Pattern: Step-through pattern;Trunk rotated posteriorly on  left;Shuffle;Decreased stride length Gait velocity: decreased and staggering  General Gait Details: unsteady with ambulation, poor attention and spacial awareness Modified Rankin (Stroke Patients Only) Pre-Morbid Rankin Score: No symptoms Modified Rankin: Moderately severe disability      PT Goals (current goals can now be found in the care plan section) Acute Rehab PT Goals Patient Stated Goal: to get to rehab PT Goal Formulation: With patient/family Time For Goal Achievement: 10/18/12 Potential to Achieve Goals: Good  Visit Information  Last PT Received On: 10/09/12 Assistance Needed: +2 (safety) History of Present Illness: Kelli [name] is an 62 y.o. female, right handed, with a past medical history significant for HTN, smoking, fibromyalgia, sent from [facility] 10/03/2012 for further evaluation. MRI brain showed bi-cerebral strokes.    Subjective Data  Patient Stated Goal: to get to rehab   Cognition  Cognition Arousal/Alertness: Awake/alert Behavior During Therapy: WFL for tasks assessed/performed Overall Cognitive Status: Impaired/Different from baseline Area of Impairment: Safety/judgement;Awareness;Attention Current Attention Level: Selective Safety/Judgement: Decreased awareness of deficits Awareness: Intellectual Problem Solving: Slow processing;Decreased initiation;Difficulty sequencing;Requires verbal cues;Requires tactile cues    Balance  Balance Balance Assessed: Yes Dynamic Sitting Balance Dynamic Sitting - Balance Support: Feet supported;During functional activity Dynamic Sitting - Level of Assistance: 5: Stand by assistance Dynamic Sitting - Balance Activities: Forward lean/weight shifting Dynamic Sitting - Comments: performing hygiene Dynamic Standing Balance Dynamic Standing - Balance Support: During functional activity Dynamic Standing - Level of Assistance: 4: Min assist Dynamic Standing - Balance Activities: Lateral lean/weight  shifting;Forward lean/weight shifting;Reaching for objects;Reaching across midline Dynamic Standing - Comments: washing hands and face at sink  End of Session PT - End of Session Equipment Utilized During Treatment: Gait belt Activity Tolerance: Patient limited by fatigue Patient left: in chair;with call bell/phone within  reach   [place], Lamar 10/09/2012, 9:29 AM Alben Deeds, PT DPT  [phone]

## 2012-10-09 NOTE — Care Management Note (Addendum)
    Page 1 of 1   10/09/2012     3:30:15 PM   CARE MANAGEMENT NOTE 10/09/2012  Patient:  Kelli [name], Kelli [name]   Account Number:  [id number]  Date Initiated:  10/09/2012  Documentation initiated by:  Luz Lex  Subjective/Objective Assessment:   CVA - carotid narrowing - CEA surgery.  lives with family     Action/Plan:   Anticipated DC Date:  10/09/2012   Anticipated DC Plan:  IP REHAB FACILITY      DC Planning Services  CM consult      Choice offered to / List presented to:             Status of service:  Completed, signed off Medicare Important Message given?   (If response is "NO", the following Medicare IM given date fields will be blank) Date Medicare IM given:   Date Additional Medicare IM given:    Discharge Disposition:  IP REHAB FACILITY  Per UR Regulation:  Reviewed for med. necessity/level of care/duration of stay  If discussed at [place] of Stay Meetings, dates discussed:    Comments:  Contact:  Starr,Curtis   [id number]   [facility] Sister     [phone]  10-09-12 - 3:30pm Luz Lex, RNBSN [phone] Lives with family - plan for discharge to [facility] rehab today.

## 2012-10-09 NOTE — Evaluation (Signed)
Occupational Therapy Evaluation Patient Details Name: Kelli [name] MRN: [id number] DOB: [birth year] Today's Date: 10/09/2012 Time: [id number] OT Time Calculation (min): 33 min  OT Assessment / Plan / Recommendation History of present illness Kelli [name] is an 62 y.o. female, right handed, with a past medical history significant for HTN, smoking, fibromyalgia, sent from [facility] 10/03/2012 for further evaluation. MRI brain showed bi-cerebral strokes.   Clinical Impression   Pt presents to OT with decreased I with ADL activity and will benefit from skilled OT to increase I with ADL activity due to problems listed below    OT Assessment  Patient needs continued OT Services    Follow Up Recommendations  CIR       Equipment Recommendations  Other (comment) (TBD)    Recommendations for Other Services Rehab consult  Frequency  Min 3X/week    Precautions / Restrictions Precautions Precautions: Fall Precaution Comments: poor visuospatial awareness       ADL  Grooming: Performed;Wash/dry hands;Wash/dry face;Moderate assistance Where Assessed - Grooming: Supported standing Toilet Transfer: Performed;Maximal Print production planner Method: Sit to Loss adjuster, chartered: Comfort height toilet Toileting - Clothing Manipulation and Hygiene: Performed;Maximal assistance Where Assessed - Best boy and Hygiene: Standing;Sit to stand from 3-in-1 or toilet Transfers/Ambulation Related to ADLs: Pt needed increased time and  very specific directions during therapy session.  Pt with slow processing and decreased vision during ambulaiton and ADL activity    OT Diagnosis: Generalized weakness;Disturbance of vision  OT Problem List: Decreased strength;Decreased activity tolerance;Decreased safety awareness;Impaired balance (sitting and/or standing);Decreased coordination;Impaired vision/perception OT Treatment Interventions: Self-care/ADL training;Therapeutic  exercise;Neuromuscular education;Therapeutic activities;Cognitive remediation/compensation;Visual/perceptual remediation/compensation;Patient/family education   OT Goals(Current goals can be found in the care plan section) Acute Rehab OT Goals Patient Stated Goal: to get to rehab Time For Goal Achievement: 10/23/12 Potential to Achieve Goals: Good  Visit Information  Last OT Received On: 10/09/12 Assistance Needed: +2 (safety) History of Present Illness: Kelli [name] is an 62 y.o. female, right handed, with a past medical history significant for HTN, smoking, fibromyalgia, sent from [facility] 10/03/2012 for further evaluation. MRI brain showed bi-cerebral strokes.       Prior [place] expects to be discharged to:: Private residence Living Arrangements: Spouse/significant other Type of Home: House Home Access: Stairs to enter [employer] of Steps: 3 Entrance Stairs-Rails: None Home Layout: Two level;Able to live on main level with bedroom/bathroom Alternate Level Stairs-Number of Steps: flight Alternate Level Stairs-Rails: Right Home Equipment: None Prior Function Level of Independence: Independent Communication Communication: No difficulties Dominant Hand: Right         Vision/Perception Vision - History Baseline Vision: Other (comment) (family did take glasses home) Patient Visual Report: Blurring of vision;Unable to keep objects in focus Vision - Assessment Eye Alignment: Within Functional Limits Vision Assessment: Vision impaired - to be further tested in functional context Additional Comments: unable to identify number of fingers OT holding up in R upper quadrant   Cognition  Cognition Arousal/Alertness: Awake/alert Behavior During Therapy: WFL for tasks assessed/performed Overall Cognitive Status: Impaired/Different from baseline Area of Impairment: Safety/judgement;Awareness;Attention Current Attention  Level: Selective Safety/Judgement: Decreased awareness of deficits Awareness: Intellectual Problem Solving: Slow processing;Decreased initiation;Difficulty sequencing;Requires verbal cues;Requires tactile cues    Extremity/Trunk Assessment Upper Extremity Assessment Upper Extremity Assessment: Generalized weakness;RUE deficits/detail;LUE deficits/detail RUE Coordination: decreased fine motor;decreased gross motor LUE Coordination: decreased fine motor;decreased gross motor     Mobility Bed Mobility Bed Mobility: Supine  to Sit Supine to Sit: 5: Supervision;HOB flat Details for Bed Mobility Assistance: for safety Transfers Sit to Stand: 4: Min guard;From bed Stand to Sit: 4: Min guard;To bed Details for Transfer Assistance: for safety with decreased awareness        Balance Balance Balance Assessed: Yes Dynamic Sitting Balance Dynamic Sitting - Balance Support: Feet supported;During functional activity Dynamic Sitting - Level of Assistance: 5: Stand by assistance Dynamic Sitting - Balance Activities: Forward lean/weight shifting Dynamic Sitting - Comments: performing hygiene Dynamic Standing Balance Dynamic Standing - Balance Support: During functional activity Dynamic Standing - Level of Assistance: 4: Min assist Dynamic Standing - Balance Activities: Lateral lean/weight shifting;Forward lean/weight shifting;Reaching for objects;Reaching across midline Dynamic Standing - Comments: washing hands and face at sink   End of Session OT - End of Session Equipment Utilized During Treatment: Gait belt Activity Tolerance: Patient tolerated treatment well Patient left: in chair       [place], Thereasa Parkin 10/09/2012, 9:29 AM

## 2012-10-09 NOTE — Progress Notes (Signed)
[facility] PROGRESS NOTE  TUERE DARRIN [id number] DOB: 1950-06-28 DOA: 10/03/2012 PCP: Manon Hilding, MD  Summary  Kelli [name] is a 62 y.o. female, right handed, with a past medical history significant for HTN, smoking, and fibromyalgia, sent from [facility] 10/03/2012 for further evaluation. MRI brain showed bilateral watershed infarcts.  On 8/2 she started noticing some unusual fatigue associated with right arm numbness, poor balance, disorientation, and was confused to the point that she was not able to connect things. She also had blurred vision and a sensation of heaviness in both arms. She never had similar symptoms before and decided to go to to [facility] ED the next day where she had a CT brain that showed no acute intracranial abnormality. She was discharged from the ED 8/3.  Her symptoms persisted She presented to [facility] 8/5  Patient was not a TPA candidate secondary to delay in arrival from symptom onset. She was admitted for further evaluation and treatment. Workup significant for right ICA occlusion. Critical left ICA stenosis. Had CEA 8/8. CIR consult pending.    Assessment/Plan:  Principal Problem:   Acute CVA (cerebrovascular accident), bilateral watershed. No new deficits postop   Active Problems:   Occlusion and stenosis of carotid artery with cerebral infarction:  left carotid endarterectomy 8/8.  Occlusion of left subclavian artery    Hypertension: Weaned off nitrite drip overnight. Adjusting oral medications for optimal blood pressure control. Saline lock IV.    Obesity    Hyperglycemia hemoglobin A1c only 6.1, Will continue to monitor. Diet changed to diabetic heart healthy. May require oral therapy if blood glucoses remain elevated.  Tobacco abuse, counseled against.     Other and unspecified hyperlipidemia: on statin    Renal insufficiency: creatinine improved after IV fluids. Likely has chronic kidney disease.  Medically stable for  transfer to rehabilitation if accepted.  Code Status: full Family Communication:  Disposition Plan: CIR once medically stable postop  Consultants:  Stroke team  Vascular surgery  Rehabilitation medicine  Procedures:  Left CEA 10/06/12  Antibiotics:  HPI/Subjective: Nausea improved, but intermittent. Has more energy today. Worried about her future. Feels ready to go to rehabilitation if accepted.  Objective: Filed Vitals:   10/09/12 0900  BP:   Pulse: 99  Temp:   Resp: 21    Intake/Output Summary (Last 24 hours) at 10/09/12 0911 Last data filed at 10/09/12 0800  Gross per 24 hour  Intake 1191.7 ml  Output   1500 ml  Net -308.3 ml   Filed Weights   10/04/12 0027 10/04/12 0131 10/06/12 0400  Weight: 89.359 kg (197 lb) 89.54 kg (197 lb 6.4 oz) 89.812 kg (198 lb)    Exam:   General:  In chair. More alert. Occasionally tearful.  Cardiovascular: Regular rate rhythm without murmurs gallops rub  Respiratory: Clear to auscultation bilaterally without wheezes rhonchi or rales  Abdomen: Soft nontender nondistended  Extremities: Hands are edematous. SCDs Neurologic: crainial nerves intact. Strength in arms and legs good  Data Reviewed: Basic Metabolic Panel:  Recent Labs Lab 10/03/12 1813 10/04/12 0520 10/06/12 0525 10/07/12 0514 10/08/12 0320 10/09/12 0430  NA 134*  --  140 136 133* 138  K 3.6  --  3.2* 3.8 3.7 4.1  CL 97  --  105 102 103 105  CO2 26  --  26 24 22 21   GLUCOSE 143*  --  134* 130* 153* 138*  BUN 13  --  19 20 17 18   CREATININE 1.14* 1.21*  1.27* 1.69* 1.49* 1.32*  CALCIUM 9.5  --  8.7 7.7* 7.7* 8.4   Liver Function Tests:  Recent Labs Lab 10/03/12 1813  AST 16  ALT 14  ALKPHOS 92  BILITOT 0.3  PROT 7.4  ALBUMIN 3.6   No results found for this basename: LIPASE, AMYLASE,  in the last 168 hours No results found for this basename: AMMONIA,  in the last 168 hours CBC:  Recent Labs Lab 10/03/12 1813 10/04/12 0520  10/06/12 0525 10/07/12 0514 10/08/12 0320 10/09/12 0430  WBC 12.8* 10.7* 8.4 11.8* 10.5 11.0*  NEUTROABS 8.3*  --   --   --   --   --   HGB 14.2 12.5 12.2 10.2* 9.3* 10.1*  HCT 40.1 36.7 36.0 29.6* 27.3* 29.6*  MCV 83.9 85.3 85.3 86.3 86.7 87.1  PLT 344 315 258 262 268 259   Cardiac Enzymes:  Recent Labs Lab 10/03/12 1815  TROPONINI <0.30   BNP (last 3 results) No results found for this basename: PROBNP,  in the last 8760 hours CBG:  Recent Labs Lab 10/08/12 0718 10/08/12 1119 10/08/12 1652 10/08/12 2142 10/09/12 0730  GLUCAP 156* 162* 155* 132* 149*    Recent Results (from the past 240 hour(s))  SURGICAL PCR SCREEN     Status: None   Collection Time    10/06/12  4:15 AM      Result Value Range Status   MRSA, PCR NEGATIVE  NEGATIVE Final   Staphylococcus aureus NEGATIVE  NEGATIVE Final   Comment:            The Xpert SA Assay (FDA     approved for NASAL specimens     in patients over 4 years of age),     is one component of     a comprehensive surveillance     program.  Test performance has     been validated by [employer] for patients greater     than or equal to 23 year old.     It is not intended     to diagnose infection nor to     guide or monitor treatment.     Studies: No results found.  Scheduled Meds: . [START ON 10/10/2012] amLODipine  10 mg Oral Daily  . aspirin EC  325 mg Oral Daily  . cloNIDine  0.1 mg Oral TID  . diltiazem  360 mg Oral Daily  . docusate sodium  100 mg Oral Daily  . famotidine  20 mg Oral Daily  . potassium chloride SA  20 mEq Oral Daily  . simvastatin  10 mg Oral q1800   Continuous Infusions: . sodium chloride 20 mL/hr (10/08/12 1420)  . nitroPRUSSide Stopped (10/09/12 0400)    Time spent: 35 min  [facility] Pager [phone]. If 7PM-7AM, please contact night-coverage at [email or web address], password [facility] 10/09/2012, 9:11 AM  LOS: 6 days

## 2012-10-09 NOTE — PMR Pre-admission (Signed)
PMR Admission Coordinator Pre-Admission Assessment  Patient: Kelli [name] is an 62 y.o., female MRN: [id number] DOB: 1950-03-07 Height: 5\' 5"  (165.1 cm) Weight: 89.812 kg (198 lb)              Insurance Information HMO:     PPO:       PCP:       IPA:       80/20:       OTHER:  Choice Plus Plan PRIMARY: [facility]     Policy#: [id number]      Subscriber: Calla Kicks CM Name: Geryl Rankins      Phone#: [phone]     Fax#:   Pre-Cert#: [id number]      Employer: [employer] Benefits:  Phone [phone]     Name: Judeth Porch. 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   History of Present Illness: A 62 y.o. right-handed female with history of hypertension, tobacco abuse, fibromyalgia. Patient independent prior to admission. Admitted to [facility] 10/03/2012 with right sided weakness and altered mental status. Cranial CT scan showed multifocal acute and subacute ischemia throughout the cerebral hemispheres bilaterally as compared to prior scanning from MRI outside hospital 10/03/2012. Patient did not receive TPA. 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 Echocardiogram with ejection fraction of 60% without embolus. 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 Vascular surgery was consulted in reference to left carotid stenosis and  underwent left carotid endarterectomy 10/06/2012 per Dr. Donnetta Hutching. Patient is presently maintained on aspirin therapy for CVA prophylaxis . She is tolerating a regular consistency diet with findings of mild hyperglycemia and hemoglobin A1c 6.1. Diet changed to carb modified and monitored with blood sugars a.c. and at bedtime. Physical therapy evaluation completed 10/04/2012 with recommendations for physical medicine rehabilitation consult to consider inpatient rehabilitation services.     Total: 3=NIH  Past Medical History  Past Medical History  Diagnosis Date  . Hypertension   . Fibromyalgia   . Stroke   . Cerebral infarction     multiple ischemic; both hemispheres/notes 10/03/2012 (10/04/2012)  . High cholesterol   . Borderline diabetes     "I come from a family of diabetics" (10/04/2012)  . Urinary incontinence     "last few days" (10/04/2012)    Family History  family history includes Diabetes in her brother, father, mother, and paternal grandmother; Heart disease in her maternal grandfather and paternal grandfather; Hypertension in her maternal grandmother and sister; Kidney disease in her maternal grandmother; and Seizures in her son.  Prior Rehab/Hospitalizations: No previous rehab admissions.   Current Medications  Current facility-administered medications:acetaminophen (TYLENOL) suppository 325-650 mg, 325-650 mg, Rectal, Q4H PRN, Nancy Nordmann Roczniak, PA-C;  acetaminophen (TYLENOL) tablet 325-650 mg,  325-650 mg, Oral, Q4H PRN, Nancy Nordmann Roczniak, PA-C;  albuterol (PROVENTIL) (5 MG/ML) 0.5% nebulizer solution 2.5 mg, 2.5 mg, Nebulization, Q6H PRN, Regina J Roczniak, PA-C amLODipine (NORVASC) tablet 10 mg, 10 mg, Oral, Daily, Delfina Redwood, MD, 10 mg at 10/09/12 1035;  antiseptic oral rinse (BIOTENE) solution 15 mL, 15 mL, Mouth Rinse, PRN, Delfina Redwood, MD;  aspirin EC tablet 325 mg, 325 mg, Oral, Daily, Regina J Roczniak, PA-C, 325 mg at 10/09/12 1020;  clonazePAM (KLONOPIN) tablet  0.5 mg, 0.5 mg, Oral, BID PRN, Orvan Falconer, MD, 0.5 mg at 10/04/12 2358 cloNIDine (CATAPRES) tablet 0.1 mg, 0.1 mg, Oral, TID, Delfina Redwood, MD, 0.1 mg at 10/09/12 1020;  diltiazem (CARDIZEM CD) 24 hr capsule 360 mg, 360 mg, Oral, Daily, Delfina Redwood, MD, 360 mg at 10/09/12 1020;  docusate sodium (COLACE) capsule 100 mg, 100 mg, Oral, Daily, Regina J Roczniak, PA-C, 100 mg at 10/09/12 1340;  famotidine (PEPCID) tablet 20 mg, 20 mg, Oral, Daily, Orvan Falconer, MD, 20 mg at 10/09/12 1020 guaiFENesin-dextromethorphan (ROBITUSSIN DM) 100-10 MG/5ML syrup 15 mL, 15 mL, Oral, Q4H PRN, Regina J Roczniak, PA-C;  hydrALAZINE (APRESOLINE) injection 10 mg, 10 mg, Intravenous, Q2H PRN, Nancy Nordmann Roczniak, PA-C, 10 mg at 10/09/12 0609;  labetalol (NORMODYNE,TRANDATE) injection 10 mg, 10 mg, Intravenous, Q2H PRN, Delfina Redwood, MD, 10 mg at 10/09/12 [id number] morphine 2 MG/ML injection 2-5 mg, 2-5 mg, Intravenous, Q1H PRN, Nancy Nordmann Roczniak, PA-C, 2 mg at 10/09/12 0414;  nitroPRUSSide (NIPRIDE) 100 mg in dextrose 5 % 250 mL infusion, 0.25-1 mcg/kg/min, Intravenous, Continuous, Serafina Mitchell, MD, 0.3 mcg/kg/min at 10/09/12 0300;  ondansetron (ZOFRAN) injection 4 mg, 4 mg, Intravenous, Q6H PRN, Delfina Redwood, MD, 4 mg at 10/07/12 0258 oxyCODONE-acetaminophen (PERCOCET/ROXICET) 5-325 MG per tablet 1-2 tablet, 1-2 tablet, Oral, Q4H PRN, Nancy Nordmann Roczniak, PA-C;  phenol (CHLORASEPTIC) mouth spray 1 spray, 1 spray, Mouth/Throat, PRN, Regina J Roczniak, PA-C;  potassium chloride SA (K-DUR,KLOR-CON) CR tablet 20 mEq, 20 mEq, Oral, Daily, Orvan Falconer, MD, 20 mEq at 10/09/12 1020;  simvastatin (ZOCOR) tablet 10 mg, 10 mg, Oral, q1800, Delfina Redwood, MD, 10 mg at 10/08/12 1808  Patients Current Diet: Carb Control  Precautions / Restrictions Precautions Precautions: Fall Precaution Comments: poor visuospatial awareness   Prior Activity Level Limited Community (1-2x/wk): Went out 2-3 X a week.  Home Assistive  Devices / Equipment Home Assistive Devices/Equipment: CBG Meter;Eyeglasses Home Equipment: None  Prior Functional Level Prior Function Level of Independence: Independent  Current Functional Level Cognition  Overall Cognitive Status: Impaired/Different from baseline Current Attention Level: Selective Orientation Level: Oriented to person;Oriented to place;Oriented to time Safety/Judgement: Decreased awareness of deficits    Extremity Assessment (includes Sensation/Coordination)  Upper Extremity Assessment: Generalized weakness;RUE deficits/detail;LUE deficits/detail RUE Coordination: decreased fine motor;decreased gross motor LUE Coordination: decreased fine motor;decreased gross motor  Lower Extremity Assessment: RLE deficits/detail;LLE deficits/detail RLE Deficits / Details: AROM/strength WFL LLE Deficits / Details: AROM WFL; strength hip flexion 3+/5, knee extension 4/5, ankle DF 4/5 LLE Coordination: decreased gross motor    ADLs  Grooming: Performed;Wash/dry hands;Wash/dry face;Moderate assistance Where Assessed - Grooming: Supported standing Toilet Transfer: Performed;Maximal Print production planner Method: Sit to Loss adjuster, chartered: Comfort height toilet Toileting - Clothing Manipulation and Hygiene: Performed;Maximal assistance Where Assessed - Best boy and Hygiene: Standing;Sit to stand from 3-in-1 or toilet Transfers/Ambulation Related to ADLs: Pt needed increased time and  very specific directions during therapy session.  Pt with slow processing  and decreased vision during ambulaiton and ADL activity    Mobility  Bed Mobility: Supine to Sit Supine to Sit: 5: Supervision;HOB flat Sit to Supine: 4: Min guard    Transfers  Transfers: Sit to Stand;Stand to Sit Sit to Stand: 4: Min guard;From bed Stand to Sit: 4: Min guard;To bed    Ambulation / Gait / Stairs / Wheelchair Mobility  Ambulation/Gait Ambulation/Gait Assistance:  4: Min Wellsite geologist (Feet): 30 Feet Assistive device: 1 person hand held assist Ambulation/Gait Assistance Details: Started with walker and pt c/o feeling it was getting away from her and had to guide walker; then with HHA on left pt reported felt better, but noted still kept left side retracted, able to maneuver around obstacles in room with verbal cues; noted once back in room pt began to fall to right leaning into wall until cued to look straight ahead and fix her balance Gait Pattern: Step-through pattern;Trunk rotated posteriorly on left;Shuffle;Decreased stride length Gait velocity: decreased and staggering  General Gait Details: unsteady with ambulation, poor attention and spacial awareness    Posture / Balance Dynamic Sitting Balance Dynamic Sitting - Balance Support: Feet supported;During functional activity Dynamic Sitting - Level of Assistance: 5: Stand by assistance Dynamic Sitting - Balance Activities: Forward lean/weight shifting Dynamic Sitting - Comments: performing hygiene Dynamic Standing Balance Dynamic Standing - Balance Support: During functional activity Dynamic Standing - Level of Assistance: 4: Min assist Dynamic Standing - Balance Activities: Lateral lean/weight shifting;Forward lean/weight shifting;Reaching for objects;Reaching across midline Dynamic Standing - Comments: washing hands and face at sink    Special needs/care consideration BiPAP/CPAP No CPM No Continuous Drip IV No Dialysis No        Life Vest No Oxygen* No Special Bed No  Trach Size No Wound Vac (area) No       Skin Has an incision Left neck from L CEA                               Bowel mgmt: No BM since 10/03/12 Bladder mgmt: Voiding in the bathroom with assistance Diabetic mgmt Is on diabetic diet now    Previous Home Environment Living Arrangements: Spouse/significant other Type of Home: House Home Layout: Two level;Able to live on main level with  bedroom/bathroom Alternate Level Stairs-Rails: Right Alternate Level Stairs-Number of Steps: flight Home Access: Stairs to enter Entrance Stairs-Rails: None Entrance Stairs-Number of Steps: 3 Bathroom Shower/Tub: Chiropodist: Standard Home Care Services: No  Discharge Living Setting Plans for Discharge Living Setting: [name] home;House;Lives with (comment) (Lives with husband, sister, and 1 son.) Type of Home at Discharge: House Discharge Home Layout: Two level;Able to live on main level with bedroom/bathroom Alternate Level Stairs-Number of Steps: Flight Discharge Home Access: Stairs to enter Entrance Stairs-Number of Steps: 3 at front entrance Does the patient have any problems obtaining your medications?: No  Social/Family/Support Systems Patient Roles: Spouse;Parent (Has husband, sister and 3 sons.) Contact Information: Driana Hughett - spouse (h) [phone] (c) [phone] Anticipated Caregiver: Alphonse Guild - sister Anticipated Caregiver's Contact Information: Amy - sister [phone] Ability/Limitations of Caregiver: Husband works days.  Sister is able to assist and provide supervision.  Sons work. Caregiver Availability: 24/7 Discharge Plan Discussed with Primary Caregiver: Yes Is Caregiver In Agreement with Plan?: Yes Does Caregiver/Family have Issues with Lodging/Transportation while Pt is in Rehab?: No  Goals/Additional Needs Patient/Family Goal for Rehab: PT/OT supervision, no ST needs Expected length  of stay: 1-2 weeks Cultural Considerations: [facility] Dietary Needs: Carb mod med calorie, thin liquids Equipment Needs: TBD Pt/Family Agrees to Admission and willing to participate: Yes Program Orientation Provided & Reviewed with Pt/Caregiver Including Roles  & Responsibilities: Yes   Decrease burden of Care through IP rehab admission: N/A  Possible need for SNF placement upon discharge: Not likely   Patient Condition: This patient's medical  and functional status has changed since the consult dated: 10/05/12 in which the Rehabilitation Physician determined and documented that the patient's condition is appropriate for intensive rehabilitative care in an inpatient rehabilitation facility. See "History of Present Illness" (above) for medical update. Functional changes are: Currently ambulating with minimum assist 30 ft with +1 HHA.  Needed mod/max assist with ADLs. [name] medical and functional status update has been discussed with the Rehabilitation physician and patient remains appropriate for inpatient rehabilitation. Will admit to inpatient rehab today.  Preadmission Screen Completed By:  Retta Diones, 10/09/2012 3:00 PM ______________________________________________________________________   Discussed status with Dr. Letta Pate on 10/09/12 at 1455 and received telephone approval for admission today.  Admission Coordinator:  Retta Diones, time1455/Date08/11/14

## 2012-10-09 NOTE — Progress Notes (Addendum)
VASCULAR AND VEIN SURGERY POST - OP CEA PROGRESS NOTE  Date of Surgery: 10/03/2012 - 10/06/2012  Surgeon(s): [name], MD 3 Days Post-Op left CEA .  HPI: Kelli [name] is a 62 y.o. female who is 3 Days Post-Op . Patient is doing well. Pre-operative symptoms are slightly  Improved She still has mild left sided neglect but can raise left arm better today Patient denies headache; Patient denies difficulty swallowing; denies weakness in upper or lower extremities except left arm .  She C/O gait being "wobbly" She has more purposeful movement with RUE - can easily grasp cup this am- was somewhat clumsy yesterday. Left eyelid droop unchanged  Significant Diagnostic Studies: CBC Lab Results  Component Value Date   WBC PENDING 10/09/2012   HGB 10.1* 10/09/2012   HCT 29.6* 10/09/2012   MCV 87.1 10/09/2012   PLT PENDING 10/09/2012    BMET    Component Value Date/Time   NA 138 10/09/2012 0430   K 4.1 10/09/2012 0430   CL 105 10/09/2012 0430   CO2 21 10/09/2012 0430   GLUCOSE 138* 10/09/2012 0430   BUN 18 10/09/2012 0430   CREATININE 1.32* 10/09/2012 0430   CALCIUM 8.4 10/09/2012 0430   GFRNONAA 42* 10/09/2012 0430   GFRAA 49* 10/09/2012 0430    COAG Lab Results  Component Value Date   INR 1.00 10/06/2012   INR 0.94 10/03/2012   No results found for this basename: PTT      Intake/Output Summary (Last 24 hours) at 10/09/12 0804 Last data filed at 10/09/12 0600  Gross per 24 hour  Intake 1347.9 ml  Output   1500 ml  Net -152.1 ml    Physical Exam:  BP Readings from Last 3 Encounters:  10/09/12 180/61  10/09/12 180/61  10/01/12 152/90   Temp Readings from Last 3 Encounters:  10/09/12 98.4 F (36.9 C) Oral  10/09/12 98.4 F (36.9 C) Oral  10/01/12 98.8 F (37.1 C) Oral   SpO2 Readings from Last 3 Encounters:  10/09/12 94%  10/09/12 94%  10/01/12 97%   Pulse Readings from Last 3 Encounters:  10/09/12 96  10/09/12 96  10/01/12 89    Pt is A&O x 3 Speech is  fluent left Neck Wound is healing well Patient with Negative tongue deviation and Negative facial droop Pt has good and equal strength in all extremities  Assessment/Plan:: Kelli [name] is a 62 y.o. female is S/P Left CEA Pt is voiding, ambulating with assist and taking po well Neuro exam essentially unchanged She has more purposeful movement with RUE - can easily grasp cup this am-  HTN per med sx - weaning off nipride OK to transfer out of unit from our standpoint when off nipride CIR?  [place] J  10/09/2012 8:04 AM  I have examined the patient, reviewed and agree with above. Neck wound healing nicely. Left arm clumsiness improving. Interestingly the patient now has a bounding left radial pulse. 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 Plan for rehabilitation and blood pressure control  Karema Tocci, MD 10/09/2012 2:12 PM

## 2012-10-09 NOTE — H&P (Signed)
Physical Medicine and Rehabilitation Admission H&P  Chief Complaint   Patient presents with   .  Dizziness ,L sided weakness  :  HPI: Kelli [name] is a 62 y.o. right-handed female with history of hypertension, tobacco abuse, fibromyalgia. Patient independent prior to admission. Admitted to [facility] 10/03/2012 with right sided weakness and altered mental status. Cranial CT scan showed multifocal acute and subacute ischemia throughout the cerebral hemispheres bilaterally as compared to prior scanning from MRI outside hospital 10/03/2012. Patient did not receive TPA. 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 Echocardiogram with ejection fraction of 60% without embolus. 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 Vascular surgery was consulted in reference to left carotid stenosis and underwent left carotid endarterectomy 10/06/2012 per Dr. Donnetta Hutching. Patient is presently maintained on aspirin therapy for CVA prophylaxis . She is tolerating a regular consistency diet with findings of mild hyperglycemia and hemoglobin A1c 6.1. Diet changed to carb modified and monitored with blood sugars a.c. and at bedtime. Physical therapy evaluation completed 10/04/2012 with recommendations for physical medicine rehabilitation consult to consider inpatient rehabilitation services. Patient was felt to be a good candidate for inpatient rehabilitation services and was admitted for comprehensive rehabilitation program   Husband notes that the patient has difficulty when asked to do something but can do it naturally on her own.  Review of Systems  Respiratory: Positive for cough.  Musculoskeletal: Positive for myalgias and joint pain.  Psychiatric/Behavioral:  Anxiety  All other systems reviewed and are negative  Past Medical History   Diagnosis  Date   .  Hypertension    .  Fibromyalgia    .  Stroke    .  Cerebral infarction       multiple ischemic; both hemispheres/notes 10/03/2012 (10/04/2012)   .  High cholesterol    .  Borderline diabetes      "I come from a family of diabetics" (10/04/2012)   .  Urinary incontinence      "last few days" (10/04/2012)    Past Surgical History   Procedure  Laterality  Date   .  Urethral dilation   1980's   .  Combined hysterectomy vaginal w/ mmk / a&p repair   1981   .  Vaginal hysterectomy   1981     "partial" (10/04/2012)    Family History   Problem  Relation  Age of Onset   .  Diabetes  Mother    .  Diabetes  Father    .  Hypertension  Sister    .  Diabetes  Brother    .  Seizures  Son    .  Kidney disease  Maternal Grandmother    .  Hypertension  Maternal Grandmother    .  Heart disease  Maternal Grandfather    .  Diabetes  Paternal Grandmother    .  Heart disease  Paternal Grandfather     Social History: reports that she has been smoking Cigarettes. She has a 44 pack-year smoking history. She has never used smokeless tobacco. She reports that she does not drink alcohol or use illicit drugs.  Allergies:  Allergies   Allergen  Reactions   .  Hydrochlorothiazide  Nausea And Vomiting    Medications Prior to Admission   Medication  Sig  Dispense  Refill   .  clonazePAM (KLONOPIN) 0.5 MG tablet  Take 0.5 mg by mouth 2 (two) times  daily as needed for anxiety.     Marland Kitchen  diltiazem (TAZTIA XT) 360 MG 24 hr capsule  Take 360 mg by mouth daily.     .  meclizine (ANTIVERT) 25 MG tablet  Take 25 mg by mouth daily as needed for dizziness.     .  potassium chloride SA (K-DUR,KLOR-CON) 20 MEQ tablet  Take 20 mEq by mouth daily.     .  ranitidine (ZANTAC) 150 MG tablet  Take 150 mg by mouth daily as needed for heartburn.      Home:  Home Living  Family/patient expects to be discharged to:: Private residence  Living Arrangements: Spouse/significant other  Type of Home: House  Home Access: Stairs to enter  [employer] of Steps: 3  Entrance Stairs-Rails: None  Home  Layout: Two level;Able to live on main level with bedroom/bathroom  Alternate Level Stairs-Number of Steps: flight  Alternate Level Stairs-Rails: Right  Home Equipment: None  Functional History:   Functional Status:  Mobility:  Bed Mobility  Bed Mobility: Supine to Sit  Supine to Sit: 5: Supervision;HOB flat  Sit to Supine: 4: Min guard  Transfers  Transfers: Sit to Stand;Stand to Sit  Sit to Stand: 4: Min guard;From bed  Stand to Sit: 4: Min guard;To bed  Ambulation/Gait  Ambulation/Gait Assistance: 4: Min assist  Ambulation Distance (Feet): 30 Feet  Assistive device: 1 person hand held assist  Ambulation/Gait Assistance Details: Started with walker and pt c/o feeling it was getting away from her and had to guide walker; then with HHA on left pt reported felt better, but noted still kept left side retracted, able to maneuver around obstacles in room with verbal cues; noted once back in room pt began to fall to right leaning into wall until cued to look straight ahead and fix her balance  Gait Pattern: Step-through pattern;Trunk rotated posteriorly on left;Shuffle;Decreased stride length  Gait velocity: decreased and staggering  General Gait Details: unsteady with ambulation, poor attention and spacial awareness   ADL:  ADL  Grooming: Performed;Wash/dry hands;Wash/dry face;Moderate assistance  Where Assessed - Grooming: Supported standing  Toilet Transfer: Performed;Maximal Administrator, arts Method: Sit to Surveyor, quantity: Comfort height toilet  Transfers/Ambulation Related to ADLs: Pt needed increased time and very specific directions during therapy session. Pt with slow processing and decreased vision during ambulaiton and ADL activity  Cognition:  Cognition  Overall Cognitive Status: Impaired/Different from baseline  Orientation Level: Oriented to person;Oriented to place;Oriented to time  Cognition  Arousal/Alertness: Awake/alert  Behavior During  Therapy: WFL for tasks assessed/performed  Overall Cognitive Status: Impaired/Different from baseline  Area of Impairment: Safety/judgement;Awareness;Attention  Current Attention Level: Selective  Safety/Judgement: Decreased awareness of deficits  Awareness: Intellectual  Problem Solving: Slow processing;Decreased initiation;Difficulty sequencing;Requires verbal cues;Requires tactile cues  Physical Exam:  Blood pressure 169/63, pulse 87, temperature 98.4 F (36.9 C), temperature source Oral, resp. rate 18, height 5\' 5"  (1.651 m), weight 89.812 kg (198 lb), SpO2 94.00%.  Constitutional: She is oriented to person, place, and time.  HENT:  Head: Normocephalic.  Eyes: EOM are normal.  Neck: Normal range of motion. Neck supple. No thyromegaly present.  Cardiovascular: Normal rate and regular rhythm.  Pulmonary/Chest: Effort normal and breath sounds normal. No respiratory distress.  Abdominal: Soft. Bowel sounds are normal. She exhibits no distension.  Neurological: She is alert and oriented to person, place, and time.  Follows full commands. Decreased fine motor skills on the right  Skin: Skin is warm.  Left carotid endarterectomy site clean and dry with Steri-Strips in place.  motor strength 5/5 in the right deltoid, bicep, tricep, grip, hip flexor, knee extensors, ankle dorsiflexor plantar flexor  4/5 left deltoid, bicep, tricep, grip, hip flexor, knee extensors, ankle dorsiflexor and plantar flexor  Cerebellar has past pointing with bilateral finger nose to finger.  Extraocular muscles are intact no evidence of nystagmus  Sitting balance is good  Sensation intact to light touch in both upper and lower extremities  Results for orders placed during the hospital encounter of 10/03/12 (from the past 48 hour(s))   GLUCOSE, CAPILLARY Status: Abnormal    Collection Time    10/07/12 7:35 PM   Result  Value  Range    Glucose-Capillary  164 (*)  70 - 99 mg/dL   BASIC METABOLIC PANEL Status:  Abnormal    Collection Time    10/08/12 3:20 AM   Result  Value  Range    Sodium  133 (*)  135 - 145 mEq/L    Potassium  3.7  3.5 - 5.1 mEq/L    Chloride  103  96 - 112 mEq/L    CO2  22  19 - 32 mEq/L    Glucose, Bld  153 (*)  70 - 99 mg/dL    BUN  17  6 - 23 mg/dL    Creatinine, Ser  1.49 (*)  0.50 - 1.10 mg/dL    Calcium  7.7 (*)  8.4 - 10.5 mg/dL    GFR calc non Af Amer  36 (*)  >90 mL/min    GFR calc Af Amer  42 (*)  >90 mL/min    Comment:      The eGFR has been calculated     using the CKD EPI equation.     This calculation has not been     validated in all clinical     situations.     eGFR's persistently     <90 mL/min signify     possible Chronic Kidney Disease.   CBC Status: Abnormal    Collection Time    10/08/12 3:20 AM   Result  Value  Range    WBC  10.5  4.0 - 10.5 K/uL    RBC  3.15 (*)  3.87 - 5.11 MIL/uL    Hemoglobin  9.3 (*)  12.0 - 15.0 g/dL    HCT  27.3 (*)  36.0 - 46.0 %    MCV  86.7  78.0 - 100.0 fL    MCH  29.5  26.0 - 34.0 pg    MCHC  34.1  30.0 - 36.0 g/dL    RDW  14.3  11.5 - 15.5 %    Platelets  268  150 - 400 K/uL   GLUCOSE, CAPILLARY Status: Abnormal    Collection Time    10/08/12 7:18 AM   Result  Value  Range    Glucose-Capillary  156 (*)  70 - 99 mg/dL    Comment 1  Notify RN    GLUCOSE, CAPILLARY Status: Abnormal    Collection Time    10/08/12 11:19 AM   Result  Value  Range    Glucose-Capillary  162 (*)  70 - 99 mg/dL    Comment 1  Notify RN    GLUCOSE, CAPILLARY Status: Abnormal    Collection Time    10/08/12 4:52 PM   Result  Value  Range    Glucose-Capillary  155 (*)  70 - 99 mg/dL  Comment 1  Notify RN    GLUCOSE, CAPILLARY Status: Abnormal    Collection Time    10/08/12 9:42 PM   Result  Value  Range    Glucose-Capillary  132 (*)  70 - 99 mg/dL   BASIC METABOLIC PANEL Status: Abnormal    Collection Time    10/09/12 4:30 AM   Result  Value  Range    Sodium  138  135 - 145 mEq/L    Potassium  4.1  3.5 - 5.1 mEq/L     Chloride  105  96 - 112 mEq/L    CO2  21  19 - 32 mEq/L    Glucose, Bld  138 (*)  70 - 99 mg/dL    BUN  18  6 - 23 mg/dL    Creatinine, Ser  1.32 (*)  0.50 - 1.10 mg/dL    Calcium  8.4  8.4 - 10.5 mg/dL    GFR calc non Af Amer  42 (*)  >90 mL/min    GFR calc Af Amer  49 (*)  >90 mL/min    Comment:      The eGFR has been calculated     using the CKD EPI equation.     This calculation has not been     validated in all clinical     situations.     eGFR's persistently     <90 mL/min signify     possible Chronic Kidney Disease.   CBC Status: Abnormal    Collection Time    10/09/12 4:30 AM   Result  Value  Range    WBC  11.0 (*)  4.0 - 10.5 K/uL    Comment:  WHITE COUNT CONFIRMED ON SMEAR    RBC  3.40 (*)  3.87 - 5.11 MIL/uL    Hemoglobin  10.1 (*)  12.0 - 15.0 g/dL    HCT  29.6 (*)  36.0 - 46.0 %    MCV  87.1  78.0 - 100.0 fL    MCH  29.7  26.0 - 34.0 pg    MCHC  34.1  30.0 - 36.0 g/dL    RDW  14.5  11.5 - 15.5 %    Platelets  259  150 - 400 K/uL    Comment:  PLATELET COUNT CONFIRMED BY SMEAR   GLUCOSE, CAPILLARY Status: Abnormal    Collection Time    10/09/12 7:30 AM   Result  Value  Range    Glucose-Capillary  149 (*)  70 - 99 mg/dL   GLUCOSE, CAPILLARY Status: Abnormal    Collection Time    10/09/12 11:40 AM   Result  Value  Range    Glucose-Capillary  124 (*)  70 - 99 mg/dL    No results found.  Post Admission Physician Evaluation:  1. Functional deficits secondary to bilateral cerebral infarcts from bilateral ICA stenosis. 2. Patient is admitted to receive collaborative, interdisciplinary care between the physiatrist, rehab nursing staff, and therapy team. 3. [name] level of medical complexity and substantial therapy needs in context of that medical necessity cannot be provided at a lesser intensity of care such as a SNF. 4. Patient has experienced substantial functional loss from his/her baseline which was documented above under the "Functional History" and  "Functional Status" headings. Judging by the patient's diagnosis, physical exam, and functional history, the patient has potential for functional progress which will result in measurable gains while on inpatient rehab. These gains will be of substantial and practical  use upon discharge in facilitating mobility and self-care at the household level. 5. Physiatrist will provide 24 hour management of medical needs as well as oversight of the therapy plan/treatment and provide guidance as appropriate regarding the interaction of the two. 6. 24 hour rehab nursing will assist with bladder management, bowel management, safety, skin/wound care, disease management, medication administration, pain management and patient education and help integrate therapy concepts, techniques,education, etc. 7. PT will assess and treat for/with: Pre-gait training, gait training, safety, equipment, neuromuscular reeducation, endurance. Goals are: Supervision level for all mobility. 8. OT will assess and treat for/with: ADLs, neuromuscular reeducation, cognitive perceptual skills, safety, endurance, equipment. Goals are: Supervision with all ADLs. 9. SLP will assess and treat for/with: Cognition, apraxia. Goals are: Improve ability to follow commands. 10. Case Management and Social Worker will assess and treat for psychological issues and discharge planning. 11. Team conference will be held weekly to assess progress toward goals and to determine barriers to discharge. 12. Patient will receive at least 3 hours of therapy per day at least 5 days per week. 13. ELOS: 10-12 days  14. Prognosis: good Medical Problem List and Plan:  1. bilateral without associated infarcts felt to be thromboembolic secondary to carotid stenosis  2. DVT Prophylaxis/Anticoagulation: SCDs. The patient is ambulatory  3. Pain Management/history fibromyalgia. Percocet as needed. Monitor with increased mobility  4. Mood/anxiety. Klonopin 0.5 mg twice a day as  needed. Provide emotional support.  5. Neuropsych: This patient is capable of making decisions on her own behalf.  6. Left carotid endarterectomy status post 10/06/2012. Followup with vascular surgery  7. Hypertension. Norvasc 10 mg daily, clonidine 0.1 milligrams 3 times a day, Cardizem CD 360 mg daily. Monitor the increased mobility  8. Hyperlipidemia. Zocor  9. Tobacco abuse. Counseling  10. Hyperglycemia. Hemoglobin A1c 6.1. Check blood sugars a.c. and at bedtime. Diet changed to carb modified.   Charlett Blake M.D. [facility] FAAPM&R (Sports Med, Neuromuscular Med) Diplomate Am Board of Electrodiagnostic Med Diplomate Am Board of Pain Medicine Fellow Am Board of Interventional Pain Physicians  10/09/2012

## 2012-10-09 NOTE — Progress Notes (Signed)
Stroke Team Progress Note  HISTORY Kelli [name] is a 62 y.o. female, right handed, with a past medical history significant for HTN, smoking, and fibromyalgia, sent from [facility] 10/03/2012 for further evaluation. MRI brain showed bi-cerebral strokes.    On 8/2 she started noticing some unusual fatigue associated with right arm numbness, poor balance, disorientation, and was confused to the point that she was not able to connect things. She also had blurred vision and a sensation of heaviness in both arms. She never had similar symptoms before and decided to go to to [facility] ED the next day where she had a CT brain that showed no acute intracranial abnormality. She was discharged from the ED 8/3.  Her symptoms persisted She presented to [facility] where an MRI revealed multiple ischemic infarcts in both cerebral hemispheres. She was transferred to [facility] where a CT brain was performed upon arrival to [facility] ED which disclosed multi focal acute/subacute ischemia throughout the cerebral hemispheres bilaterally, similar to recent brain MRI. She denied headache, vertigo, double vision, difficulty swallowing, slurred speech, language or vision impairment. No recent fever, infection, head or neck trauma.   Patient was not a TPA candidate secondary to delay in arrival from symptom onset. She was admitted for further evaluation and treatment.  SUBJECTIVE Patient up in chair at the bedside. Family also present.  OBJECTIVE Most recent Vital Signs: Filed Vitals:   10/09/12 0700 10/09/12 0728 10/09/12 0800 10/09/12 0900  BP: 172/55  183/56   Pulse: 91  90 99  Temp:  98.4 F (36.9 C)    TempSrc:  Oral    Resp:   21 21  Height:      Weight:      SpO2: 93%  95% 95%   CBG (last 3)   Recent Labs  10/08/12 1652 10/08/12 2142 10/09/12 0730  GLUCAP 155* 132* 149*    IV Fluid Intake:   . nitroPRUSSide Stopped (10/09/12 0400)    MEDICATIONS  . [START ON 10/10/2012] amLODipine  10 mg Oral Daily   . aspirin EC  325 mg Oral Daily  . cloNIDine  0.1 mg Oral TID  . diltiazem  360 mg Oral Daily  . docusate sodium  100 mg Oral Daily  . famotidine  20 mg Oral Daily  . potassium chloride SA  20 mEq Oral Daily  . simvastatin  10 mg Oral q1800   PRN:  acetaminophen, acetaminophen, albuterol, antiseptic oral rinse, clonazePAM, guaiFENesin-dextromethorphan, hydrALAZINE, labetalol, morphine injection, ondansetron, oxyCODONE-acetaminophen, phenol  Diet:  Carb Control thin liquids Activity:  Bathroom privileges with assistance DVT Prophylaxis:  SCDs   CLINICALLY SIGNIFICANT STUDIES Basic Metabolic Panel:   Recent Labs Lab 10/08/12 0320 10/09/12 0430  NA 133* 138  K 3.7 4.1  CL 103 105  CO2 22 21  GLUCOSE 153* 138*  BUN 17 18  CREATININE 1.49* 1.32*  CALCIUM 7.7* 8.4   Liver Function Tests:   Recent Labs Lab 10/03/12 1813  AST 16  ALT 14  ALKPHOS 92  BILITOT 0.3  PROT 7.4  ALBUMIN 3.6   CBC:   Recent Labs Lab 10/03/12 1813  10/08/12 0320 10/09/12 0430  WBC 12.8*  < > 10.5 11.0*  NEUTROABS 8.3*  --   --   --   HGB 14.2  < > 9.3* 10.1*  HCT 40.1  < > 27.3* 29.6*  MCV 83.9  < > 86.7 87.1  PLT 344  < > 268 259  < > = values in this  interval not displayed. Coagulation:   Recent Labs Lab 10/03/12 1813 10/06/12 0525  LABPROT 12.4 13.0  INR 0.94 1.00   Cardiac Enzymes:   Recent Labs Lab 10/03/12 1815  TROPONINI <0.30   Urinalysis:  No results found for this basename: COLORURINE, APPERANCEUR, LABSPEC, PHURINE, GLUCOSEU, HGBUR, BILIRUBINUR, KETONESUR, PROTEINUR, UROBILINOGEN, NITRITE, LEUKOCYTESUR,  in the last 168 hours Lipid Panel    Component Value Date/Time   CHOL 211* 10/05/2012 0027   TRIG 302* 10/05/2012 0027   HDL 30* 10/05/2012 0027   CHOLHDL 7.0 10/05/2012 0027   VLDL 60* 10/05/2012 0027   LDLCALC 121* 10/05/2012 0027   HgbA1C  Lab Results  Component Value Date   HGBA1C 6.1* 10/04/2012    Urine Drug Screen:      Component Value Date/Time    LABOPIA NONE DETECTED 10/05/2012 0011    Alcohol Level: No results found for this basename: ETH,  in the last 168 hours  CT of the brain   10/03/2012  1.  Multi focal acute/subacute ischemia throughout the cerebral hemispheres bilaterally, similar to recent brain MRI 10/03/2012.  10/01/2012 (at [facility]) Chronic microvascular ischemic changes in the white matter. No acute process by noncontrast CT.   CT angio of the neck 10/05/2012 1. Occluded right internal carotid artery with slight reconstitution at the petrous segment. 2. High-grade, near occlusive proximal left internal carotid artery stenosis. 3. Occluded left subclavian artery.  4. Normal appearance of the right vertebral artery. 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 5. Decreased vessel diameter of the left vertebral artery below the C6 segment, suggesting a focal stenosis.  MRI of the brain  (done at [facility]) Bilateral watershed infarcts  MRA of the brain  10/04/2012  Occluded right internal carotid artery.  The right anterior and middle cerebral arteries are supplied by the anterior communicating artery.  There is probable atherosclerotic disease in the middle cerebral arteries bilaterally.  There is a moderate stenosis of the proximal left posterior cerebral artery.   2D Echocardiogram  EF 55-60% with no source of embolus.   Carotid Doppler  10/04/2012 Right: Occluded internal carotid artery. Left: Greater than 80% internal carotid artery stenosis. Right: Vertebral artery flow is antegrade. Left: Vertebral artery retrograde.   CXR  10/01/2012 Mild hyperinflation and chronic bronchitic changes, suspect COPD/emphysema No superimposed CHF or pneumonia  EKG  normal sinus rhythm.   Therapy Recommendations CIR  Physical Exam   Pleasant middle-aged Caucasian lady currently not in distress. Awake alert. Afebrile. Head is nontraumatic. Neck is supple without bruit. Hearing is normal. Cardiac exam no  murmur.  Neurological Exam :  Awake alert with normal speech and language function. Vision fields appear normal. Mild left lower facial weakness. Tongue is midline.  . Motor system exam reveals bilateral (left greater than right) upper extremity drift and weakness in the left grip and intrinsic hand muscles. Mild decreased left lower extremity strength. Sensation is intact. Gait was not tested.   ASSESSMENT Kelli [name] is a 62 y.o. female presenting with unsteadiness, right upper extremity numbness, heaviness bilateral UE, disorientation. Imaging confirms bilateral watershed infarcts. Infarcts felt to be thromboembolic secondary to carotid stenosis - carotid dopplers confirm  occlusion of right ICA and > 80% left ICA stenosis.  S/p L CEA on 10/03/2012 by Dr. Donnetta Hutching.   Doing well post op. On no antithrombotic prior to admission. Now on aspirin 325 mg orally every day for secondary stroke prevention.  Hypertension  Hyperlipidemia, LDL 126, on no statin PTA, now on statin, goal LDL < 100  - Now on Zocor. Fibromyalgia Cigarette smoker Obesity, Body mass index is 32.95 kg/(m^2).   Hospital day # 6  TREATMENT/PLAN  Continue  aspirin 325 mg orally every day for secondary stroke prevention.  OOB. Resume therapy evals.  Plan transfer to rehab  - admissions coordinator is readdressing.  Ok for transfer to the floor from neuro standpoint  Risk factor modification. - smoking cessation.  No further IP stroke needs. Patient has a 10-15% risk of having another stroke over the next year, the highest risk is within 2 weeks of the most recent stroke/TIA (risk of having a stroke following a stroke or TIA is the same). Stroke Service will sign off. Please call should any needs arise. Follow up with Dr. Leonie Man, [facility], in 2 months.   Burnetta Sabin, MSN, RN, ANVP-BC, ANP-BC, [facility] Pager: [phone] 10/09/2012 10:05 AM  I have personally obtained a history, examined  the patient, evaluated imaging results, and formulated the assessment and plan of care. I agree with the above.  Antony Contras, MD

## 2012-10-09 NOTE — Progress Notes (Signed)
Rehab admissions - Evaluated for possible admission.  I have called [employer] and have opened the case requesting inpatient rehab admission.  I will update all once I hear back from [facility] case manager.  Call me for questions.  RC:[phone]

## 2012-10-09 NOTE — Progress Notes (Signed)
Pt discharged to inpatient rehab the patient;s husband notified

## 2012-10-09 NOTE — Progress Notes (Signed)
At 1645, patient admitted to rehab from 2300. Patient educated regarding plan of care, safety instructions. Patient denies pain. Blurred vision. Left arm weaker than right. Slight productive cough.  VS stable, BP running higher, as per recent trends. To monitor. Tonita Cong, RN

## 2012-10-09 NOTE — Progress Notes (Signed)
Rehab admissions - I have authorization for acute inpatient rehab admission for today.  Bed available and can admit today.  Call me for questions.  [phone]

## 2012-10-10 ENCOUNTER — Inpatient Hospital Stay (HOSPITAL_COMMUNITY): Payer: 59 | Admitting: Occupational Therapy

## 2012-10-10 ENCOUNTER — Inpatient Hospital Stay (HOSPITAL_COMMUNITY): Payer: 59

## 2012-10-10 DIAGNOSIS — I639 Cerebral infarction, unspecified: Secondary | ICD-10-CM

## 2012-10-10 DIAGNOSIS — I6330 Cerebral infarction due to thrombosis of unspecified cerebral artery: Secondary | ICD-10-CM

## 2012-10-10 DIAGNOSIS — G8110 Spastic hemiplegia affecting unspecified side: Secondary | ICD-10-CM

## 2012-10-10 LAB — CBC WITH DIFFERENTIAL/PLATELET
Basophils Relative: 0 % (ref 0–1)
Eosinophils Absolute: 0.4 10*3/uL (ref 0.0–0.7)
Hemoglobin: 9.2 g/dL — ABNORMAL LOW (ref 12.0–15.0)
MCH: 29.4 pg (ref 26.0–34.0)
MCHC: 33.5 g/dL (ref 30.0–36.0)
Monocytes Relative: 14 % — ABNORMAL HIGH (ref 3–12)
Neutrophils Relative %: 59 % (ref 43–77)
Platelets: 269 10*3/uL (ref 150–400)
RDW: 14.2 % (ref 11.5–15.5)

## 2012-10-10 LAB — COMPREHENSIVE METABOLIC PANEL
Albumin: 2.4 g/dL — ABNORMAL LOW (ref 3.5–5.2)
Alkaline Phosphatase: 65 U/L (ref 39–117)
BUN: 22 mg/dL (ref 6–23)
Creatinine, Ser: 1.46 mg/dL — ABNORMAL HIGH (ref 0.50–1.10)
Potassium: 3.7 mEq/L (ref 3.5–5.1)
Total Protein: 5.8 g/dL — ABNORMAL LOW (ref 6.0–8.3)

## 2012-10-10 LAB — GLUCOSE, CAPILLARY: Glucose-Capillary: 104 mg/dL — ABNORMAL HIGH (ref 70–99)

## 2012-10-10 MED ORDER — CLONAZEPAM 0.5 MG PO TABS
0.2500 mg | ORAL_TABLET | Freq: Two times a day (BID) | ORAL | Status: DC | PRN
  Administered 2012-10-14: 0.25 mg via ORAL
  Filled 2012-10-10: qty 1

## 2012-10-10 MED ORDER — CLONIDINE HCL 0.1 MG PO TABS
0.1000 mg | ORAL_TABLET | Freq: Once | ORAL | Status: AC
  Administered 2012-10-10: 0.1 mg via ORAL
  Filled 2012-10-10: qty 1

## 2012-10-10 NOTE — Plan of Care (Signed)
Overall Plan of Care [facility]) Patient Details Name: Kelli [name] MRN: [id number] DOB: [birth year]  Diagnosis:  Erenest Rasher, watershed distribution infarcts  Co-morbidities: htn, hyperglycemia, anxiety  Functional Problem List  Patient demonstrates impairments in the following areas: Balance, Behavior, Bladder, Bowel, Cognition, Edema, Medication Management, Motor, Pain, Perception, Safety, Vision and functional endurance  Basic ADL's: eating, grooming, bathing, dressing and toileting Advanced ADL's: simple meal preparation  Transfers:  bed mobility, bed to chair, toilet, tub/shower and car Locomotion:  ambulation and stairs  Additional Impairments:  Social Cognition   social interaction, problem solving, memory, attention and awareness  Anticipated Outcomes Item Anticipated Outcome  Eating/Swallowing    Basic self-care  Supervision  Tolieting  Supervision  Bowel/Bladder  Cont of bowel and bladder  Transfers  Supervision   Locomotion  Supervision   Communication    Cognition  Supervision-Min A  Pain  Less or equal to 2  Safety/Judgment  Adhere to safety protocol  Other     Therapy Plan: PT Intensity: Minimum of 1-2 x/day ,45 to 90 minutes PT Frequency: 5 out of 7 days PT Duration Estimated Length of Stay: 10-12 days OT Frequency: 5 out of 7 days OT Duration/Estimated Length of Stay: 10-12 days SLP Intensity: Minumum of 1-2 x/day, 30 to 90 minutes SLP Frequency: 5 out of 7 days SLP Duration/Estimated Length of Stay: 1 week    Team Interventions: Item RN PT OT SLP SW TR Other  Self Care/Advanced ADL Retraining   x      Neuromuscular Re-Education  x x      Therapeutic Activities  x x x  x   UE/LE Strength Training/ROM  x x   x   UE/LE Coordination Activities  x x   x   Visual/Perceptual Remediation/Compensation  x x   x   DME/Adaptive Equipment Instruction  x x   x   Therapeutic Exercise  x x   x   Balance/Vestibular Training  x x   x   Patient/Family  Education  x x x  x   Cognitive Remediation/Compensation  x x x  x   Functional Mobility Training  x x   x   Ambulation/Gait Training  x    x   Stair Training  x       Wheelchair Propulsion/Positioning  x    x   Functional IT sales professional Reintegration  x    x   Dysphagia/Aspiration Environmental consultant         Bladder Management x        Bowel Management x        Disease Management/Prevention x x       Pain Management x  x      Medication Management x        Skin Care/Wound Management x        Splinting/Orthotics         Discharge Planning x x  x x x   Psychosocial Support x x  x x x                          Team Discharge Planning: Destination: PT-Home ,OT- Home , SLP-Home Projected Follow-up: PT-Home health PT;Outpatient PT;24 hour supervision/assistance, OT-   , SLP-24 hour supervision/assistance;Outpatient SLP Projected Equipment Needs: PT- , OT- Tub/shower bench, SLP-None recommended by SLP Patient/family involved in  discharge planning: PT- Patient,  OT-Patient, SLP-Patient  MD ELOS: 10-12 days Medical Rehab Prognosis:  Excellent Assessment: The patient has been admitted for CIR therapies. The team will be addressing, functional mobility, strength, stamina, balance, safety, adaptive techniques/equipment, self-care, bowel and bladder mgt, patient and caregiver education, visual spatial awareness, cognition, NMR. Goals have been set at supervision.    Meredith Staggers, MD, FAAPMR,     See Team Conference Notes for weekly updates to the plan of care

## 2012-10-10 NOTE — Progress Notes (Signed)
Occupational Therapy Session Note  Patient Details  Name: Kelli [name] MRN: [id number] Date of Birth: 1950/06/13  Today's Date: 10/10/2012 Time: 1115-1200 Time Calculation (min): 45 min  Short Term Goals: Week 1:  OT Short Term Goal 1 (Week 1): Patient will complete dressing with setup assistance OT Short Term Goal 2 (Week 1): Patient will complete bathing, seated, with supervision for safety OT Short Term Goal 3 (Week 1): Patient will perform ADL using left UE at diminshed level with verbal cues to initiate. OT Short Term Goal 4 (Week 1): Patient will demonstrate improved endurance as evidenced by ability to tolerate 60 minutes of ADL with only 1 rest break OT Short Term Goal 5 (Week 1): Patient will demonstrate ability to perform HEP with supervision.  Skilled Therapeutic Interventions/Progress Updates:  Cognitive remediation/compensation;Discharge planning;Patient/family education;Neuromuscular re-education;Self Care/advanced ADL retraining;Therapeutic Activities;Visual/perceptual remediation/compensation;UE/LE Coordination activities;UE/LE Strength taining/ROM;Therapeutic Exercise;DME/adaptive equipment instruction   Patient found supine in bed with no complaints of pain. Patient with complaints of being fatigued after therapies this am. Patient engaged in bed mobility and sat edge of bed -> right side of bed then ambulated -> w/c seated near sink using rolling walker. Patient with some inattention -> left side during functional mobility. Therapist propelled patient -> therapy gym and adjusted left leg rest. Therapist then engaged patient in some visual testing; eye ROM and peripheral vision were intact, no visual field cuts noted. During functional activities, noticed decreased spatial awareness. Tested LUE and shoulder strength =3/5, elbow and wrist strength=4+/5. After testing, engaged patient in LUE coordination, motor planning, and spatial awareness exercises using clothes pins. Patient  attempted to propel self back to room. Therapist left patient seated in w/c at end of session with quick release belt donned and call bell & phone within reach. Encouraged patient to sit up in w/c. Therapist also administered stress ball secondary to edema throughout patient's bilateral hands.   Recommend a vestibular evaluation. Patient with complaints of on-going vestibular issues and stated she has had bad spells of nausea in the past.   Impairments noticed during treatment session: Decreased overall activity tolerance/endurance, inattention to left side of body, decreased spatial awareness during functional tasks, decreased coordination -> LUE, poor motor planning while using LUE, eye fatigue, decreased problem solving skills, decreased processing skills.   Precautions:  Precautions Precautions: Fall Precaution Comments: poor visuospatial awareness  See FIM for current functional status  Therapy/Group: Individual Therapy  Zed Wanninger 10/10/2012, 12:08 PM

## 2012-10-10 NOTE — Progress Notes (Signed)
Inpatient [facility] Individual Statement of Services  Patient Name:  Kelli [name]  Date:  10/10/2012  Welcome to the [facility].  Our goal is to provide you with an individualized program based on your diagnosis and situation, designed to meet your specific needs.  With this comprehensive rehabilitation program, you will be expected to participate in at least 3 hours of rehabilitation therapies Monday-Friday, with modified therapy programming on the weekends.  Your rehabilitation program will include the following services:  Physical Therapy (PT), Occupational Therapy (OT), 24 hour per day rehabilitation nursing, Therapeutic Recreation (TR), Neuropsychology, Case Management (Social Worker), Rehabilitation Medicine, Nutrition Services and Pharmacy Services  Weekly team conferences will be held on Tuesdays to discuss your progress.  Your Social Worker will talk with you frequently to get your input and to update you on team discussions.  Team conferences with you and your family in attendance may also be held.  Expected length of stay: 1-2 weeks Overall anticipated outcome: Supervision level of care  Depending on your progress and recovery, your program may change. Your Social Worker will coordinate services and will keep you informed of any changes. Your Social Worker's name and contact numbers are listed  below.  The following services may also be recommended but are not provided by the [facility] will be made to provide these services after discharge if needed.  Arrangements include referral to agencies that provide these services.  Your insurance has been verified to be:  [employer] Your primary doctor is:  Dr. Consuello Masse  Pertinent information will be shared with your doctor and your insurance company.  Social  Worker:  Alfonse Alpers, LCSW  [phone] or ([phone]  Information discussed with and copy given to patient by: Trey Sailors, 10/10/2012, 2:17 PM

## 2012-10-10 NOTE — Evaluation (Signed)
Physical Therapy Assessment and Plan  Patient Details  Name: Kelli [name] MRN: [id number] Date of Birth: March 31, 1950  PT Diagnosis: Abnormality of gait, Coordination disorder, Difficulty walking, Dizziness and giddiness, Hemiparesis, Impaired cognition, Impaired sensation and Muscle weakness Rehab Potential: Good ELOS: 10-12 days   Today's Date: 10/10/2012 Time: [id number] Time Calculation (min): 57 min  Problem List:  Patient Active Problem List   Diagnosis Date Noted  . CVA (cerebral infarction) 10/10/2012  . Occlusion and stenosis of carotid artery with cerebral infarction 10/05/2012  . Other and unspecified hyperlipidemia 10/05/2012  . Hyperglycemia 10/05/2012  . Renal insufficiency 10/05/2012  . Acute CVA (cerebrovascular accident) 10/04/2012  . Hypertension 10/04/2012  . Tobacco abuse 10/04/2012  . Obesity 10/04/2012    Past Medical History:  Past Medical History  Diagnosis Date  . Hypertension   . Fibromyalgia   . Stroke   . Cerebral infarction     multiple ischemic; both hemispheres/notes 10/03/2012 (10/04/2012)  . High cholesterol   . Borderline diabetes     "I come from a family of diabetics" (10/04/2012)  . Urinary incontinence     "last few days" (10/04/2012)   Past Surgical History:  Past Surgical History  Procedure Laterality Date  . Urethral dilation  1980's  . Combined hysterectomy vaginal w/ mmk / a&p repair  1981  . Vaginal hysterectomy  1981    "partial" (10/04/2012)  . Endarterectomy Left 10/06/2012    Procedure: Carotid Endarterectomy with Finesse patch angioplasty;  Surgeon: [name], MD;  Location: [facility] OR;  Service: Vascular;  Laterality: Left;    Assessment & Plan Clinical Impression: Kelli [name] is a 62 y.o. right-handed female with history of hypertension, tobacco abuse, fibromyalgia. Patient independent prior to admission. Admitted to [facility] 10/03/2012 with right sided weakness and altered mental status. Cranial CT scan showed  multifocal acute and subacute ischemia throughout the cerebral hemispheres bilaterally as compared to prior scanning from MRI outside hospital 10/03/2012. Patient did not receive TPA. 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 Echocardiogram with ejection fraction of 60% without embolus. 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 Vascular surgery was consulted in reference to left carotid stenosis and underwent left carotid endarterectomy 10/06/2012 per Dr. Donnetta Hutching. Patient is presently maintained on aspirin therapy for CVA prophylaxis . She is tolerating a regular consistency diet with findings of mild hyperglycemia and hemoglobin A1c 6.1. Diet changed to carb modified and monitored with blood sugars a.c. and at bedtime. Physical therapy evaluation completed 10/04/2012 with recommendations for physical medicine rehabilitation consult to consider inpatient rehabilitation services. Patient was felt to be a good candidate for inpatient rehabilitation services and was admitted for comprehensive rehabilitation program    Patient transferred to CIR on 10/09/2012 .   Patient currently requires min to mod assist with mobility secondary to muscle weakness, impaired timing and sequencing and decreased coordination, decreased visual perceptual skills, decreased attention to left and decreased attention to right and decreased awareness, decreased problem solving, decreased safety awareness and delayed processing.  Prior to hospitalization, patient was independent  with mobility and lived with Spouse;Family (Husband- Vicente Serene and Sister- Amy) in a House home.  Home access is 3Stairs to enter.  Patient will benefit from skilled PT intervention to maximize safe functional mobility, minimize fall risk and decrease caregiver burden for planned discharge home with 24 hour supervision to min A.  Anticipate patient will benefit from follow up [facility]  at  discharge.  PT - End of Session Endurance Deficit: Yes PT Assessment Rehab Potential: Good PT Plan PT Intensity: Minimum of 1-2 x/day ,45 to 90 minutes PT Frequency: 5 out of 7 days PT Duration Estimated Length of Stay: 10-12 days PT Treatment/Interventions: Ambulation/gait training;Balance/vestibular training;Cognitive remediation/compensation;Community reintegration;Discharge planning;Disease management/prevention;DME/adaptive equipment instruction;Functional mobility training;Patient/family education;Therapeutic Exercise;Visual/perceptual remediation/compensation;UE/LE Coordination activities;Therapeutic Activities;Stair training;UE/LE Strength taining/ROM;Wheelchair propulsion/positioning;Neuromuscular re-education;Psychosocial support PT Recommendation Recommendations for Other Services: Vestibular eval Follow Up Recommendations: Home health PT;Outpatient PT;24 hour supervision/assistance Patient destination: Home Equipment Details: DME TBD  Skilled Therapeutic Intervention 1:1. PT evaluation completed and tx initiated. Focus on amb endurance and awareness of environment during standing functional mobility. Pt able to amb 150' w/ RW, req min guard to min A for balance, but req mod A for management of RW as well as L UE on RW. Gait characterized by slow, but steady pace, decreased step-length and foot clearance bilaterally as well as reciprocal pattern. In busy hallway, pt with decreased awareness of management of RW as well as body position in RW. Pt also cued to obstacles in environment on both L and R sides. Pt req consistent mod v/c + t/ccues for correction and pt req increased time to correct once they were pointed out. Pt demonstrated improved quality of SPTs w/out use of RW vs. With RW, likley cognitively and visuospatially challenging to pt, overall min A for balance. Pt req max cues for correction prior to sitting 1x due to decreased visuospatial awareness that her bottom was not close  to the chair despite pt looking at it. Pt very fatigued at end of session. Pt also very anxious this tx session, educated at length regarding rehab process.   PT Evaluation Precautions/Restrictions Precautions Precautions: Fall Precaution Comments: poor visuospatial awareness General Chart Reviewed: Yes Family/Caregiver Present: No Vital Signs  Pain Pain Assessment Pain Assessment: No/denies pain Home Living/Prior Functioning Home Living Available Help at Discharge: Family (Primary caregiver will be pt's sister- Amy) Type of Home: House Home Access: Stairs to enter [employer] of Steps: 3 Entrance Stairs-Rails: None Home Layout: Two level;Able to live on main level with bedroom/bathroom Alternate Level Stairs-Number of Steps: flight Alternate Level Stairs-Rails: Right Lives With: Spouse;Family (Husband- Curtis and Sister- Amy) Prior Function Level of Independence: Independent with basic ADLs;Independent with homemaking with ambulation;Independent with gait;Independent with transfers  Able to Take Stairs?: Yes Driving: Yes Vocation: Retired (former SW; 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[employer] ) Leisure: Hobbies-yes (Comment) Comments: Engineer, manufacturing systems, gardening, art, nature Vision/Perception  Vision - History Baseline Vision: Wears glasses all the time Patient Visual Report: Blurring of vision;Unable to keep objects in focus Perception Perception: Impaired Comments: Visuospatial impairments: difficulty grabbing objects on meal tray with both L and R UE, needed to be corrected prior to sitting due to almost missing chair  Cognition Overall Cognitive Status: Impaired/Different from baseline Arousal/Alertness: Lethargic Orientation Level: Oriented to place;Oriented to situation;Oriented to person Attention: Sustained Sustained Attention: Appears intact Memory: Appears intact Awareness: Impaired Awareness Impairment: Emergent impairment Problem Solving: Impaired Problem  Solving Impairment: Functional basic Behaviors: Other (comment) (Anxious) Safety/Judgment: Impaired Sensation Sensation Light Touch: Appears Intact Proprioception: Impaired by gross assessment (Distal B LE> Proximal) Additional Comments: Pt verbalizes having a little difficulty telling what her L UE/LE are doing during functional tasks; No s/s of numbness/tingling Coordination Gross Motor Movements are Fluid and Coordinated: No (Decreased fluidity on L side vs. R side) Fine Motor Movements are Fluid and Coordinatied: No (L UE impaired) Heel Shin Test: Intact R heel to  L ankle-knee; Impaired L heel to R ankle-knee Motor  Motor Motor: Hemiplegia Motor - Skilled Clinical Observations: Mild L hemiplegia, decreased volitional control of L UE>L LE; 1 beat clonus in L LE  Mobility Bed Mobility Bed Mobility: Rolling Right Supine to Sit: 4: Min guard Sit to Supine: 4: Min assist Transfers Sit to Stand: 4: Min guard Stand to Sit: 4: Min guard Locomotion  Ambulation Ambulation: Yes Ambulation/Gait Assistance: 3: Mod assist Ambulation Distance (Feet): 150 Feet Assistive device: Rolling walker Ambulation/Gait Assistance Details: Verbal cues for sequencing;Tactile cues for sequencing;Verbal cues for safe use of DME/AE Ambulation/Gait Assistance Details: Pt req min A to min guard for balance, but mod A for management of RW especially w/ use of L UE; Pt noted to have decreased awareness of obstacles on both L and R sides of environment.  Gait Gait velocity: decreased step lenth and foot clearance bilaterally, step-to pattern Stairs / Additional Locomotion Stairs: No (Limited by time this tx session) Product manager Mobility: Yes Wheelchair Assistance: 4: Min Lexicographer: Both lower extermities Wheelchair Parts Management: Needs assistance Distance: 25'  Trunk/Postural Assessment  Postural Control Postural Control: Within Functional Limits  Balance Dynamic  Sitting Balance Dynamic Sitting - Balance Support: Feet supported Dynamic Sitting - Level of Assistance: 5: Stand by assistance Dynamic Sitting - Balance Activities: Forward lean/weight shifting Dynamic Sitting - Comments: eating breakfast Dynamic Standing Balance Dynamic Standing - Balance Support: During functional activity Dynamic Standing - Level of Assistance: 4: Min assist Dynamic Standing - Balance Activities: Lateral lean/weight shifting;Forward lean/weight shifting;Reaching for objects Dynamic Standing - Comments: w/ RW Extremity Assessment  RUE Assessment RUE Assessment: Within Functional Limits LUE Assessment LUE Assessment: Exceptions to WFL LUE AROM (degrees) LUE Overall AROM Comments: [name] AROM is WFL LUE Strength LUE Overall Strength Comments: Shoulder strength =3/5, elbow and wrist stength =4+/5; Patients LUE coordination and motor planning are poor throughout LUE RLE Assessment RLE Assessment: Within Functional Limits (Grossly 4+/5) LLE Assessment LLE Assessment: Exceptions to [facility] LLE Strength Left Hip Flexion: 3+/5 Left Knee Flexion: 3+/5 Left Knee Extension: 4/5 Left Ankle Dorsiflexion: 4/5 Left Ankle Plantar Flexion: 4/5  FIM:  FIM - Bed/Chair Transfer Bed/Chair Transfer: 4: Bed > Chair or W/C: Min A (steadying Pt. > 75%);4: Chair or W/C > Bed: Min A (steadying Pt. > 75%);4: Sit > Supine: Min A (steadying pt. > 75%/lift 1 leg);4: Supine > Sit: Min A (steadying Pt. > 75%/lift 1 leg) FIM - Locomotion: Wheelchair Distance: 50' Locomotion: Wheelchair: 2: Travels 50 - 149 ft with minimal assistance (Pt.>75%) FIM - Locomotion: Ambulation Locomotion: Ambulation Assistive Devices: Walker - Rolling Locomotion: Ambulation: 3: Travels 150 ft or more with moderate assistance (Pt: 50 - 74%) FIM - Locomotion: Stairs Locomotion: Stairs: 0: Activity did not occur (Limited by time this tx session)   Refer to Care Plan for Long Term Goals  Recommendations for other  services: Other: Vestibular Eval  Discharge Criteria: Patient will be discharged from PT if patient refuses treatment 3 consecutive times without medical reason, if treatment goals not met, if there is a change in medical status, if patient makes no progress towards goals or if patient is discharged from hospital.  The above assessment, treatment plan, treatment alternatives and goals were discussed and mutually agreed upon: by patient  [name] 10/10/2012, 12:08 PM

## 2012-10-10 NOTE — Progress Notes (Signed)
Patient information reviewed and entered into eRehab system by Veasna Santibanez, RN, CRRN, PPS Coordinator.  Information including medical coding and functional independence measure will be reviewed and updated through discharge.    

## 2012-10-10 NOTE — Progress Notes (Addendum)
Subjective/Complaints: Feels groggy. Had long night. Felt disoriented last night, still trying to wake up. Denies pain, sob. Has had more productive cough.   Objective: Vital Signs: Blood pressure 157/78, pulse 77, temperature 98.1 F (36.7 C), temperature source Oral, resp. rate 18, weight 92.8 kg (204 lb 9.4 oz), SpO2 94.00%. No results found.  Recent Labs  10/09/12 0430 10/10/12 0620  WBC 11.0* 8.9  HGB 10.1* 9.2*  HCT 29.6* 27.5*  PLT 259 269    Recent Labs  10/09/12 0430 10/10/12 0620  NA 138 139  K 4.1 3.7  CL 105 105  GLUCOSE 138* 129*  BUN 18 22  CREATININE 1.32* 1.46*  CALCIUM 8.4 8.8   CBG (last 3)   Recent Labs  10/09/12 1600 10/09/12 2044 10/10/12 0740  GLUCAP 145* 129* 130*    Wt Readings from Last 3 Encounters:  10/09/12 92.8 kg (204 lb 9.4 oz)  10/06/12 89.812 kg (198 lb)  10/06/12 89.812 kg (198 lb)    Physical Exam:  Constitutional: She is oriented to person, place, and time.  HENT:  Head: Normocephalic.  Eyes: EOM are normal.  Neck: Normal range of motion. Neck supple. No thyromegaly present.  Cardiovascular: Normal rate and regular rhythm.  Pulmonary/Chest: Effort normal and breath sounds normal. No respiratory distress. A few upper airway sounds. Abdominal: Soft. Bowel sounds are normal. She exhibits no distension.  Neurological: She is alert, oriented to name, [facility], reason she was here. Follows full commands. Decreased fine motor skills on the right. Delayed motor processing at times Skin: Skin is warm. Left carotid endarterectomy site clean and dry with Steri-Strips in place.  motor strength 5/5 in the right deltoid, bicep, tricep, grip, hip flexor, knee extensors, ankle dorsiflexor plantar flexor  4/5 left deltoid, bicep, tricep, grip, hip flexor, knee extensors, ankle dorsiflexor and plantar flexor  Cerebellar has continued past pointing with bilateral finger nose to finger.  Extraocular muscles are intact no evidence of nystagmus   Sitting balance is good  Sensation intact to light touch in both upper and lower extremities    Assessment/Plan: 1. Functional deficits secondary to bilateral watershed distribution infarcts which require 3+ hours per day of interdisciplinary therapy in a comprehensive inpatient rehab setting. Physiatrist is providing close team supervision and 24 hour management of active medical problems listed below. Physiatrist and rehab team continue to assess barriers to discharge/monitor patient progress toward functional and medical goals. FIM:                                 Medical Problem List and Plan:  1. 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 DVT Prophylaxis/Anticoagulation: SCDs. The patient is ambulatory  3. Pain Management/history fibromyalgia. Percocet as needed. Monitor with increased mobility  4. Mood/anxiety. Klonopin 0.5 mg twice a day as needed. Provide emotional support.  5. Neuropsych: This patient is capable of making decisions on her own behalf.  6. Left carotid endarterectomy status post 10/06/2012. Followup with vascular surgery  7. Hypertension. Norvasc 10 mg daily, clonidine 0.1 milligrams 3 times a day, Cardizem CD 360 mg daily. Monitor the increased mobility  8. Hyperlipidemia. Zocor  9. Tobacco abuse. Counseling  10. Hyperglycemia. Hemoglobin A1c 6.1. Check blood sugars a.c. and at bedtime. Diet changed to carb modified.--follow for trend  11. Baseline renal insufficiency? 1.3-1.4--recheck later this week 12/ AMS--check urine, no other obvious source at present, likely stroke  related still as well     LOS (Days) 1 A FACE TO FACE EVALUATION WAS PERFORMED  SWARTZ,ZACHARY T 10/10/2012 8:17 AM

## 2012-10-10 NOTE — Progress Notes (Signed)
Occupational Therapy Session Note  Patient Details  Name: ANAIJAH SEMAN MRN: [id number] Date of Birth: 1950-03-19  Today's Date: 10/10/2012 Time: 1330-1400 Time Calculation (min): 30 min  Short Term Goals: Week 1:  OT Short Term Goal 1 (Week 1): Patient will complete dressing with setup assistance OT Short Term Goal 2 (Week 1): Patient will complete bathing, seated, with supervision for safety OT Short Term Goal 3 (Week 1): Patient will perform ADL using left UE at diminshed level with verbal cues to initiate. OT Short Term Goal 4 (Week 1): Patient will demonstrate improved endurance as evidenced by ability to tolerate 60 minutes of ADL with only 1 rest break OT Short Term Goal 5 (Week 1): Patient will demonstrate ability to perform HEP with supervision.  Skilled Therapeutic Interventions/Progress Updates:  Cognitive remediation/compensation;Discharge planning;Patient/family education;Neuromuscular re-education;Self Care/advanced ADL retraining;Therapeutic Activities;Visual/perceptual remediation/compensation;UE/LE Coordination activities;UE/LE Strength taining/ROM;Therapeutic Exercise;DME/adaptive equipment instruction   1:1 focus on functional ambulation around room with RW to and from the bathroom twice with max cuing for attention to left side; A to maintain RW on the floor for safety. Pt with decr eye contact with conversation. Pt received card during session required mod A to open letter, increased time to read greeting at midline. Pt demonstrates significant visual perceptual deficits with increased difficulty with fatigue. Returned to bed at end of session with mod A to position self in middle of bed.    Therapy Documentation Precautions:  Precautions Precautions: Fall Precaution Comments: poor visuospatial awareness General: General Chart Reviewed: Yes Family/Caregiver Present: No    Pain: Pain Assessment Pain Assessment: No/denies pain  See FIM for current functional  status  Therapy/Group: Individual Therapy  Willeen Cass [facility] 10/10/2012, 1:56 PM

## 2012-10-10 NOTE — Evaluation (Signed)
Occupational Therapy Assessment and Plan  Patient Details  Name: Kelli [name] MRN: [id number] Date of Birth: 09/14/1950  OT Diagnosis: cognitive deficits and muscle weakness (generalized) Rehab Potential: Rehab Potential: Good ELOS: 10-12 days   Today's Date: 10/10/2012 Time: [id number] Time Calculation (min): 65 min  Problem List:  Patient Active Problem List   Diagnosis Date Noted  . CVA (cerebral infarction) 10/10/2012  . Occlusion and stenosis of carotid artery with cerebral infarction 10/05/2012  . Other and unspecified hyperlipidemia 10/05/2012  . Hyperglycemia 10/05/2012  . Renal insufficiency 10/05/2012  . Acute CVA (cerebrovascular accident) 10/04/2012  . Hypertension 10/04/2012  . Tobacco abuse 10/04/2012  . Obesity 10/04/2012    Past Medical History:  Past Medical History  Diagnosis Date  . Hypertension   . Fibromyalgia   . Stroke   . Cerebral infarction     multiple ischemic; both hemispheres/notes 10/03/2012 (10/04/2012)  . High cholesterol   . Borderline diabetes     "I come from a family of diabetics" (10/04/2012)  . Urinary incontinence     "last few days" (10/04/2012)   Past Surgical History:  Past Surgical History  Procedure Laterality Date  . Urethral dilation  1980's  . Combined hysterectomy vaginal w/ mmk / a&p repair  1981  . Vaginal hysterectomy  1981    "partial" (10/04/2012)  . Endarterectomy Left 10/06/2012    Procedure: Carotid Endarterectomy with Finesse patch angioplasty;  Surgeon: [name], MD;  Location[facility] OR;  Service: Vascular;  Laterality: Left;    Assessment & Plan Clinical Impression: Patient is a 62 y.o. year old right-handed female with history of hypertension, tobacco abuse, fibromyalgia. Patient independent prior to admission. Admitted to [facility] 10/03/2012 with right sided weakness and altered mental status. Cranial CT scan showed multifocal acute and subacute ischemia throughout the cerebral hemispheres bilaterally as  compared to prior scanning from MRI outside hospital 10/03/2012. 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 Echocardiogram with ejection fraction of 60% without embolus. 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 Vascular surgery was consulted in reference to left carotid stenosis and underwent left carotid endarterectomy 10/06/2012 per Dr. Donnetta Hutching.   Patient is presently maintained on aspirin therapy for CVA prophylaxis . She is tolerating a regular consistency diet with findings of mild hyperglycemia and hemoglobin A1c 6.1. Diet changed to carb modified and monitored with blood sugars a.c. and at bedtime. Patient transferred to CIR on 10/09/2012 .    Patient currently requires min assist with basic self-care skills secondary to impaired timing and sequencing and decreased coordination, decreased visual perceptual skills, decreased motor planning and decreased problem solving, decreased safety awareness and delayed processing.  Prior to hospitalization, patient could complete BADL independently.  Patient will benefit from skilled intervention to increase level of independence with iADL prior to discharge home with care partner.  Anticipate patient will require intermittent supervision and no further OT follow recommended.  OT - End of Session Activity Tolerance: Tolerates 10 - 20 min activity with multiple rests Endurance Deficit: Yes OT Assessment Rehab Potential: Good OT Plan OT Frequency: 5 out of 7 days OT Duration/Estimated Length of Stay: 10-12 days OT Treatment/Interventions: Cognitive remediation/compensation;Discharge planning;Patient/family education;Neuromuscular re-education;Self Care/advanced ADL retraining;Therapeutic Activities;Visual/perceptual remediation/compensation;UE/LE Coordination activities;UE/LE Strength taining/ROM;Therapeutic Exercise;DME/adaptive equipment instruction OT  Recommendation Patient destination: Home Equipment Recommended: Tub/shower bench   Skilled Therapeutic Intervention 1:1 OT initial evaluation completed with emphasis on neuromuscular re-ed, improved activity tolerance,  safe and effective use of DME, modified performance of ADL, and rehab goals and objectives.   Patient participated as directed although reporting fatigue from recent poor sleep cycle due to noises and unfamiliar environment.  OT Evaluation Precautions/Restrictions  Precautions Precautions: Fall Precaution Comments: poor visuospatial awareness  General Chart Reviewed: Yes Family/Caregiver Present: No  Pain Pain Assessment Pain Assessment: No/denies pain  Home Living/Prior Functioning Home Living Family/patient expects to be discharged to:: Private residence Living Arrangements: Spouse/significant other Available Help at Discharge: Family (Primary caregiver will be pt's sister- Amy) Type of Home: House Home Access: Stairs to enter [employer] of Steps: 3 Entrance Stairs-Rails: None Home Layout: Two level;Able to live on main level with bedroom/bathroom Alternate Level Stairs-Number of Steps: flight Alternate Level Stairs-Rails: Right  Lives With: Spouse;Family (Husband- Curtis and Sister- Amy) Prior Function Level of Independence: Independent with basic ADLs;Independent with homemaking with ambulation;Independent with gait;Independent with transfers  Able to Take Stairs?: Yes Driving: Yes Vocation: Retired (former SW; 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[employer] ) Leisure: Hobbies-yes (Comment) Comments: Engineer, manufacturing systems, gardening, art, nature  Vision/Perception  Vision - History Baseline Vision: Wears glasses all the time Patient Visual Report: Blurring of vision Vision - Assessment Eye Alignment: Within Functional Limits Perception Perception: Impaired Comments: Visuospatial impairments; Difficulty grabbing objects on meal tray with both L and R UE,  needed to be corrected prior to sitting due to almost missing chair Praxis Praxis: Impaired Praxis Impairment Details: Motor planning   Cognition Overall Cognitive Status: Impaired/Different from baseline Arousal/Alertness: Lethargic Orientation Level: Oriented to place;Oriented to situation;Oriented to person Attention: Sustained Sustained Attention: Appears intact Memory: Appears intact Awareness: Impaired Awareness Impairment: Emergent impairment Problem Solving: Impaired Executive Function: Sequencing Sequencing: Impaired Sequencing Impairment: Functional complex Behaviors: Other (comment) (Anxious) Safety/Judgment: Impaired  Sensation Sensation Light Touch: Appears Intact Stereognosis: Appears Intact Hot/Cold: Appears Intact Proprioception: Appears Intact Additional Comments: Pt verbalizes having a little difficulty telling what her L UE/LE are doing during functional tasks; No s/s of numbness/tingling Coordination Gross Motor Movements are Fluid and Coordinated: Yes Fine Motor Movements are Fluid and Coordinated: No Heel Shin Test: Intact R heel to L ankle-knee; Impaired L heel to R ankle-knee  Motor  Motor Motor: Other (comment) Motor - Skilled Clinical Observations: delayd at left UE  Mobility  Bed Mobility Bed Mobility: Rolling Right Supine to Sit: 4: Min guard Sit to Supine: 4: Min assist   Trunk/Postural Assessment  Postural Control Postural Control: Within Functional Limits   Balance Dynamic Standing Balance Dynamic Standing - Comments: Pt verbalizes hx of Vertigo  Extremity/Trunk Assessment RUE Assessment RUE Assessment: Within Functional Limits LUE Assessment to follow  FIM:  FIM - Eating Eating Activity: 6: More than reasonable amount of time;5: Set-up assist for open containers FIM - Grooming Grooming Steps: Wash, rinse, dry face;Wash, rinse, dry hands;Brush, comb hair Grooming: 5: Set-up assist to obtain items FIM - Bathing Bathing  Steps Patient Completed: Chest;Right Arm;Left Arm;Abdomen;Front perineal area;Buttocks;Right upper leg;Left upper leg;Right lower leg (including foot);Left lower leg (including foot) Bathing: 4: Steadying assist FIM - Upper Body Dressing/Undressing Upper body dressing/undressing steps patient completed: Thread/unthread right sleeve of pullover shirt/dresss;Put head through opening of pull over shirt/dress;Pull shirt over trunk Upper body dressing/undressing: 4: Min-Patient completed 75 plus % of tasks FIM - Lower Body Dressing/Undressing Lower body dressing/undressing steps patient completed: Thread/unthread right underwear leg;Thread/unthread left underwear leg;Thread/unthread right pants leg;Thread/unthread left pants leg;Don/Doff right shoe;Don/Doff left shoe Lower body dressing/undressing: 4: Min-Patient completed 75 plus % of tasks FIM - Toileting  Toileting steps completed by patient: Adjust clothing prior to toileting;Performs perineal hygiene Toileting Assistive Devices: Grab bar or rail for support Toileting: 5: Supervision: Safety issues/verbal cues FIM - Control and instrumentation engineer Devices: Bed rails Bed/Chair Transfer: 5: Supine > Sit: Supervision (verbal cues/safety issues);4: Sit > Supine: Min A (steadying pt. > 75%/lift 1 leg);4: Bed > Chair or W/C: Min A (steadying Pt. > 75%) FIM - Radio producer Devices: Grab bars Toilet Transfers: 4-To toilet/BSC: Min A (steadying Pt. > 75%);5-From toilet/BSC: Supervision (verbal cues/safety issues) FIM - Tub/Shower Transfers Tub/Shower Assistive Devices: Tub transfer bench;Grab bars;Walk in shower Tub/shower Transfers: 4-Into Tub/Shower: Min A (steadying Pt. > 75%/lift 1 leg);4-Out of Tub/Shower: Min A (steadying Pt. > 75%/lift 1 leg)   Refer to Care Plan for Long Term Goals  Recommendations for other services: None  Discharge Criteria: Patient will be discharged from OT if patient refuses  treatment 3 consecutive times without medical reason, if treatment goals not met, if there is a change in medical status, if patient makes no progress towards goals or if patient is discharged from hospital.  The above assessment, treatment plan, treatment alternatives and goals were discussed and mutually agreed upon: by patient  Maryan Puls 10/10/2012, 11:02 AM

## 2012-10-11 ENCOUNTER — Inpatient Hospital Stay (HOSPITAL_COMMUNITY): Payer: 59 | Admitting: Occupational Therapy

## 2012-10-11 ENCOUNTER — Inpatient Hospital Stay (HOSPITAL_COMMUNITY): Payer: 59

## 2012-10-11 ENCOUNTER — Inpatient Hospital Stay (HOSPITAL_COMMUNITY): Payer: 59 | Admitting: *Deleted

## 2012-10-11 DIAGNOSIS — I6330 Cerebral infarction due to thrombosis of unspecified cerebral artery: Secondary | ICD-10-CM

## 2012-10-11 DIAGNOSIS — G8110 Spastic hemiplegia affecting unspecified side: Secondary | ICD-10-CM

## 2012-10-11 LAB — URINALYSIS, ROUTINE W REFLEX MICROSCOPIC
Glucose, UA: NEGATIVE mg/dL
Hgb urine dipstick: NEGATIVE
Specific Gravity, Urine: 1.009 (ref 1.005–1.030)
pH: 7 (ref 5.0–8.0)

## 2012-10-11 LAB — URINE MICROSCOPIC-ADD ON

## 2012-10-11 LAB — GLUCOSE, CAPILLARY
Glucose-Capillary: 132 mg/dL — ABNORMAL HIGH (ref 70–99)
Glucose-Capillary: 104 mg/dL — ABNORMAL HIGH (ref 70–99)

## 2012-10-11 MED ORDER — SENNOSIDES-DOCUSATE SODIUM 8.6-50 MG PO TABS
2.0000 | ORAL_TABLET | Freq: Every day | ORAL | Status: DC
  Administered 2012-10-11 – 2012-10-18 (×8): 2 via ORAL
  Filled 2012-10-11 (×8): qty 2

## 2012-10-11 NOTE — Progress Notes (Signed)
Patient ID: Kelli [name], female   DOB: Mar 03, 1950, 62 y.o.   MRN: [id number] The patient looks quite good today. She is in the family room with her 2 sons. She does recognize that she has mild confusion and is working on this. She continues to have some weakness in her left arm. Her left neck incision is healing quite nicely. Following from the side lines.

## 2012-10-11 NOTE — Progress Notes (Signed)
Social Work  Assessment and Plan  Patient Details  Name: Kelli [name] MRN: [id number] Date of Birth: November 23, 1950  Today's Date: 10/11/2012  Problem List:  Patient Active Problem List   Diagnosis Date Noted  . CVA (cerebral infarction) 10/10/2012  . Occlusion and stenosis of carotid artery with cerebral infarction 10/05/2012  . Other and unspecified hyperlipidemia 10/05/2012  . Hyperglycemia 10/05/2012  . Renal insufficiency 10/05/2012  . Acute CVA (cerebrovascular accident) 10/04/2012  . Hypertension 10/04/2012  . Tobacco abuse 10/04/2012  . Obesity 10/04/2012   Past Medical History:  Past Medical History  Diagnosis Date  . Hypertension   . Fibromyalgia   . Stroke   . Cerebral infarction     multiple ischemic; both hemispheres/notes 10/03/2012 (10/04/2012)  . High cholesterol   . Borderline diabetes     "I come from a family of diabetics" (10/04/2012)  . Urinary incontinence     "last few days" (10/04/2012)   Past Surgical History:  Past Surgical History  Procedure Laterality Date  . Urethral dilation  1980's  . Combined hysterectomy vaginal w/ mmk / a&p repair  1981  . Vaginal hysterectomy  1981    "partial" (10/04/2012)  . Endarterectomy Left 10/06/2012    Procedure: Carotid Endarterectomy with Finesse patch angioplasty;  Surgeon: [name], MD;  Location: [facility];  Service: Vascular;  Laterality: Left;   Social History:  reports that she has been smoking Cigarettes.  She has a 44 pack-year smoking history. She has never used smokeless tobacco. She reports that she does not drink alcohol or use illicit drugs.  Family / Support Systems Marital Status: Married How Long?: 40 years Patient Roles: Spouse;Other (Comment);Parent (sister) Spouse/Significant Other: Lucrezia Back  (c)  [phone]  Children: 1 son lives with pt and her husband.  other 2 sons live within 5 minutes Other Supports: extended family and friend support; close knit community Anticipated Caregiver: Alphonse Guild -  sister Ability/Limitations of Caregiver: Husband works days.  Sister is able to assist and provide supervision.  Husband reports sister is capable of helping pt.  Sons work. Caregiver Availability: 24/7 Family Dynamics: close, supportive family  Social History Preferred language: English Religion: Patient Refused Education: college degree - social work Read: Yes Write: Yes Employment Status: Retired Date Retired/Disabled/Unemployed: "a few years ago" Age Retired: 32 Freight forwarder Issues: none disclosed Guardian/Conservator: N/A   Abuse/Neglect Physical Abuse: Denies Verbal Abuse: Denies Sexual Abuse: Denies Exploitation of patient/patient's resources: Denies Self-Neglect: Denies  Emotional Status Pt's affect, behavior adn adjustment status: Pt is in disbelief that this medical event has happened to her.  She is currently coping appropriately and ready to make some lifestyle changes.   Recent Psychosocial Issues: none Pyschiatric History: Pt has a hx of anxiety.  MD recently put pt on an anti-depressant/anti-anxiety medication, but she was not taking it.  Feels now maybe the symptoms she was experiencing were from this medical condition that was developing.  Pt has sought counseling in the past for anxiety and depression and found it helpful. Substance Abuse History: none  Patient / Family Perceptions, Expectations & Goals Pt/Family understanding of illness & functional limitations: Pt/husband express understanding of the illness and the importance of pt's rehabilitation time. Premorbid pt/family roles/activities: Pt/sister shared household chores.  Pt enjoys spending time with friends and family.  Pt likes sports, especially baseball. Anticipated changes in roles/activities/participation: Pt hopes to resume most activities, but has good family/friend support until she can.   Pt/family expectations/goals: Pt wants "  to get my speech and thinking going, and my motor  skills."  Husband wants [place] to push pt here, because he feels she will not work as hard at home and will "do whatever she wants" and not listen to family.  Community Resources Express Scripts: None Premorbid Home Care/DME Agencies: None Transportation available at discharge: family Resource referrals recommended: Neuropsychology  Discharge Planning Living Arrangements: Spouse/significant other;Other relatives (sister) Support Systems: Spouse/significant other;Children;Other relatives;Friends/neighbors;Church/faith community Type of Residence: Private residence Insurance Resources: Multimedia programmer (specify) Writer) Financial Resources: Employment (husband is an Agricultural consultant for [employer] projects. ) Financial Screen Referred: No Living Expenses: Higher education careers adviser Management: Spouse Does the patient have any problems obtaining your medications?: No Home Management: pt's family can assist Patient/Family Preliminary Plans: Pt/family plan for pt to return home with 24/7 supervision from her sister and husband. Barriers to Discharge: Steps (3 to enter home) Social Work Anticipated Follow Up Needs: [place] Additional Notes/Comments: Pt has good extended family/friend support.  Everyone is comfortable at this time with pt going home at d/c.  Husband recognizes that she has a lot of work to do on [employer]. Expected length of stay: 1-2 weeks  Clinical Impression CSW met with pt to complete assessment.  Pt has excellent family/friend support.  Her sister moved in with her and her husband a while ago and will be able to stay during pt's recovery to provide supervision and household assistance.  Pt is still surprised that she is in the condition she is in and is feeling "old."  She plans to stop smoking and does not feel she needs assistance with that at this time.  Pt has already been watching what she is eating due to history of diabetes in her family.  She has spoken to her family MD  about losing 10 pounds and she feels this goal is attainable.  Pt does not have any concerns about going home at discharge due to excellent support.  Pt does not have any equipment at home and CSW explained that whatever therapists recommend CSW can order.  CSW explained role and offered any support.  Pt admits to history of anxiety in the past and she had medication prescribed, but did not take it.  Pt has received counseling before and found that helpful.  She reports not feeling anxious currently, but is still working through the disbelief that this has happened and she is frustrated that her memory and thought processing is not what it usually is.  CSW explained that this is normal and for pt to be patient with herself as she rehabilitates to regain these functions.  CSW spoke with pt's husband who is supportive and confirmed the plan for pt's sister to be with her during the day.  Husband also stated that pt will be impatient with herself and her recovery when she gets home, so he wants her to take advantage of her time here.  CSW will continue to follow for support and assistance as needed.  Aslee Such, Silvestre Mesi 10/11/2012, 9:21 AM

## 2012-10-11 NOTE — Progress Notes (Signed)
Subjective/Complaints: Feels better today. Has had some dizziness and vertigo in the past, sometimes this makes her feel "groggy" A 12 point review of systems has been performed and if not noted above is otherwise negative.  Objective: Vital Signs: Blood pressure 186/78, pulse 83, temperature 98.2 F (36.8 C), temperature source Oral, resp. rate 18, height 5\' 5"  (1.651 m), weight 92.8 kg (204 lb 9.4 oz), SpO2 93.00%. No results found.  Recent Labs  10/09/12 0430 10/10/12 0620  WBC 11.0* 8.9  HGB 10.1* 9.2*  HCT 29.6* 27.5*  PLT 259 269    Recent Labs  10/09/12 0430 10/10/12 0620  NA 138 139  K 4.1 3.7  CL 105 105  GLUCOSE 138* 129*  BUN 18 22  CREATININE 1.32* 1.46*  CALCIUM 8.4 8.8   CBG (last 3)   Recent Labs  10/10/12 1637 10/10/12 2107 10/11/12 0742  GLUCAP 96 121* 132*    Wt Readings from Last 3 Encounters:  10/09/12 92.8 kg (204 lb 9.4 oz)  10/06/12 89.812 kg (198 lb)  10/06/12 89.812 kg (198 lb)    Physical Exam:  Constitutional: She is oriented to person, place, and time.  HENT:  Head: Normocephalic.  Eyes: EOM are normal.  Neck: Normal range of motion. Neck supple. No thyromegaly present.  Cardiovascular: Normal rate and regular rhythm.  Pulmonary/Chest: Effort normal and breath sounds normal. No respiratory distress. A few upper airway sounds. Abdominal: Soft. Bowel sounds are normal. She exhibits no distension.  Neurological: She is alert, oriented to name, [facility], reason she was here. Follows full commands. Decreased fine motor skills on the right. Delayed motor processing at times Skin: Skin is warm. Left carotid endarterectomy site clean and dry with Steri-Strips in place.  motor strength 5/5 in the right deltoid, bicep, tricep, grip, hip flexor, knee extensors, ankle dorsiflexor plantar flexor  4/5 left deltoid, bicep, tricep, grip, hip flexor, knee extensors, ankle dorsiflexor and plantar flexor  Cerebellar has continued past pointing with  bilateral finger nose to finger. Left pronator drift. Extraocular muscles are intact. Nystagmus with tracking laterally left/right Sitting balance is good  Sensation intact to light touch in both upper and lower extremities    Assessment/Plan: 1. Functional deficits secondary to bilateral watershed distribution infarcts which require 3+ hours per day of interdisciplinary therapy in a comprehensive inpatient rehab setting. Physiatrist is providing close team supervision and 24 hour management of active medical problems listed below. Physiatrist and rehab team continue to assess barriers to discharge/monitor patient progress toward functional and medical goals.  Vestibular eval requested.  FIM: FIM - Bathing Bathing Steps Patient Completed: Chest;Right Arm;Left Arm;Abdomen;Front perineal area;Buttocks;Right upper leg;Left upper leg;Right lower leg (including foot);Left lower leg (including foot) Bathing: 4: Steadying assist  FIM - Upper Body Dressing/Undressing Upper body dressing/undressing steps patient completed: Thread/unthread right sleeve of pullover shirt/dresss;Put head through opening of pull over shirt/dress;Pull shirt over trunk Upper body dressing/undressing: 4: Min-Patient completed 75 plus % of tasks FIM - Lower Body Dressing/Undressing Lower body dressing/undressing steps patient completed: Thread/unthread right underwear leg;Thread/unthread left underwear leg;Thread/unthread right pants leg;Thread/unthread left pants leg;Don/Doff right shoe;Don/Doff left shoe Lower body dressing/undressing: 4: Min-Patient completed 75 plus % of tasks  FIM - Toileting Toileting steps completed by patient: Adjust clothing prior to toileting;Performs perineal hygiene Toileting Assistive Devices: Grab bar or rail for support Toileting: 5: Supervision: Safety issues/verbal cues  FIM - Radio producer Devices: Grab bars Toilet Transfers: 4-To toilet/BSC: Min A  (steadying Pt. > 75%);5-From toilet/BSC:  Supervision (verbal cues/safety issues)  FIM - Engineer, site Assistive Devices: Bed rails Bed/Chair Transfer: 5: Supine > Sit: Supervision (verbal cues/safety issues);4: Sit > Supine: Min A (steadying pt. > 75%/lift 1 leg);4: Bed > Chair or W/C: Min A (steadying Pt. > 75%)  FIM - Locomotion: Wheelchair Distance: 25' Locomotion: Wheelchair: 2: Travels 50 - 149 ft with minimal assistance (Pt.>75%) FIM - Locomotion: Ambulation Locomotion: Ambulation Assistive Devices: Administrator Ambulation/Gait Assistance: 3: Mod assist Locomotion: Ambulation: 3: Travels 150 ft or more with moderate assistance (Pt: 50 - 74%)  Comprehension Comprehension Mode: Auditory Comprehension: 5-Follows basic conversation/direction: With extra time/assistive device  Expression Expression Mode: Verbal Expression: 5-Expresses basic needs/ideas: With extra time/assistive device  Social Interaction Social Interaction: 5-Interacts appropriately 90% of the time - Needs monitoring or encouragement for participation or interaction.  Problem Solving Problem Solving: 4-Solves basic 75 - 89% of the time/requires cueing 10 - 24% of the time  Memory Memory: 4-Recognizes or recalls 75 - 89% of the time/requires cueing 10 - 24% of the time Medical Problem List and Plan:  1. 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 DVT Prophylaxis/Anticoagulation: SCDs. The patient is ambulatory  3. Pain Management/history fibromyalgia. Percocet as needed. Monitor with increased mobility  4. Mood/anxiety. Klonopin 0.5 mg twice a day as needed. Provide emotional support.  5. Neuropsych: This patient is capable of making decisions on her own behalf.  6. Left carotid endarterectomy status post 10/06/2012. Followup with vascular surgery  7. Hypertension. Norvasc 10 mg daily, clonidine 0.1 milligrams 3 times a day,  Cardizem CD 360 mg daily. Monitor the increased mobility  8. Hyperlipidemia. Zocor  9. Tobacco abuse. Counseling  10. Hyperglycemia. Hemoglobin A1c 6.1. Check blood sugars a.c. and at bedtime. Diet changed to carb modified.--follow for trend  11. Baseline renal insufficiency? 1.3-1.4--near baseline 12/ AMS--check urine--culture pending. Some improvement today  -no other obvious source at present, likely stroke related still as well     LOS (Days) 2 A FACE TO FACE EVALUATION WAS PERFORMED  Kelli [name] T 10/11/2012 8:50 AM

## 2012-10-11 NOTE — Progress Notes (Signed)
Occupational Therapy Session Note  Patient Details  Name: Kelli [name] MRN: [id number] Date of Birth: 07/06/1950  Today's Date: 10/11/2012 Time: 1005-1100  Time Calculation (min): 55 min  Short Term Goals: Week 1:  OT Short Term Goal 1 (Week 1): Patient will complete dressing with setup assistance OT Short Term Goal 2 (Week 1): Patient will complete bathing, seated, with supervision for safety OT Short Term Goal 3 (Week 1): Patient will perform ADL using left UE at diminshed level with verbal cues to initiate. OT Short Term Goal 4 (Week 1): Patient will demonstrate improved endurance as evidenced by ability to tolerate 60 minutes of ADL with only 1 rest break OT Short Term Goal 5 (Week 1): Patient will demonstrate ability to perform HEP with supervision.  Skilled Therapeutic Interventions/Progress Updates:  Upon entering room, patient found seated in w/c eager to participate in therapy. Patient ambulated using rolling walker from w/c -> tub transfer bench seated in walk-in shower. Patient required min assist and min verbal cues in order to adhere to walker safety during functional ambulation. From here patient completed UB/LB bathing in sit<>stand position with steady assist during standing. Patient also tends to want to remain standing and stand for most to all tasks. After bathing, patient ambulated back to w/c. Once seated in w/c patient started crying and was upset stating, "I don't know if I'm going to make it"; therapist provided psychosocial support. Therapist attempted to re-direct patient and take patient's mind off of her disabilities, UB/LB dressing then completed in sit<>stand position. Patient unable to figure out how to dress self and got frustrated. Patient with noteable poor sequencing during dressing tasks, poor proprioception, and poor spatial awareness. After dressing therapist assisted patient to therapy gym and patient transferred onto mat. Patient with difficult time being able  to tell where her body was in space and straighten body out on mat. Therapist then left patient supine on mat for next therapy session.   Precautions:  Precautions Precautions: Fall Precaution Comments: poor visuospatial awareness  See FIM for current functional status  Therapy/Group: Individual Therapy  Lailoni Baquera 10/11/2012, 12:11 PM

## 2012-10-11 NOTE — Progress Notes (Signed)
Pt c/o increased "blurry " vision; B/P 198/94; HRR, 80. PAC aware, 1400 scheduled Clonidine 0.1mg  givne. Monitor.

## 2012-10-11 NOTE — Progress Notes (Signed)
Occupational Therapy Session Note  Patient Details  Name: Kelli [name] MRN: [id number] Date of Birth: 09-29-1950  Today's Date: 10/11/2012 Time: [id number] Time Calculation (min): 45 min  Short Term Goals: Week 1:  OT Short Term Goal 1 (Week 1): Patient will complete dressing with setup assistance OT Short Term Goal 2 (Week 1): Patient will complete bathing, seated, with supervision for safety OT Short Term Goal 3 (Week 1): Patient will perform ADL using left UE at diminshed level with verbal cues to initiate. OT Short Term Goal 4 (Week 1): Patient will demonstrate improved endurance as evidenced by ability to tolerate 60 minutes of ADL with only 1 rest break OT Short Term Goal 5 (Week 1): Patient will demonstrate ability to perform HEP with supervision.  Skilled Therapeutic Interventions: Therapeutic activities with emphasis on visuo-spatial awareness and perceptual processing, gross/fine motor control of L-UE at diminished level, endurance and initial home exercise program instruction.   Patient participated in isolated finger/hand exercise and challenged to reproduce basic shape using pipe-tree construction activity.  Patient reported frustration with pipe-tree but recognized need to persevere and incorporate sequential strategy to solve problem.   Patient completed basic shape (retangle) with two demonstrations and moderate cueing this session.    Therapy Documentation Precautions:  Precautions Precautions: Fall Precaution Comments: poor visuospatial awareness  Pain: Pain Assessment Pain Assessment: No/denies pain  See FIM for current functional status  Therapy/Group: Individual Therapy  Maryan Puls 10/11/2012, 3:01 PM

## 2012-10-11 NOTE — Evaluation (Signed)
Recreational Therapy Assessment and Plan  Patient Details  Name: Kelli [name] MRN: [id number] Date of Birth: Jul 13, 1950 Today's Date: 10/11/2012  Rehab Potential: Good ELOS: 10 days   Assessment Clinical Impression: Problem List:  Patient Active Problem List    Diagnosis  Date Noted   .  CVA (cerebral infarction)  10/10/2012   .  Occlusion and stenosis of carotid artery with cerebral infarction  10/05/2012   .  Other and unspecified hyperlipidemia  10/05/2012   .  Hyperglycemia  10/05/2012   .  Renal insufficiency  10/05/2012   .  Acute CVA (cerebrovascular accident)  10/04/2012   .  Hypertension  10/04/2012   .  Tobacco abuse  10/04/2012   .  Obesity  10/04/2012    Past Medical History:  Past Medical History   Diagnosis  Date   .  Hypertension    .  Fibromyalgia    .  Stroke    .  Cerebral infarction      multiple ischemic; both hemispheres/notes 10/03/2012 (10/04/2012)   .  High cholesterol    .  Borderline diabetes      "I come from a family of diabetics" (10/04/2012)   .  Urinary incontinence      "last few days" (10/04/2012)    Past Surgical History:  Past Surgical History   Procedure  Laterality  Date   .  Urethral dilation   1980's   .  Combined hysterectomy vaginal w/ mmk / a&p repair   1981   .  Vaginal hysterectomy   1981     "partial" (10/04/2012)   .  Endarterectomy  Left  10/06/2012     Procedure: Carotid Endarterectomy with Finesse patch angioplasty; Surgeon: [name], MD; Location: [facility] OR; Service: Vascular; Laterality: Left;    Assessment & Plan  Clinical Impression: Kelli [name] is a 62 y.o. right-handed female with history of hypertension, tobacco abuse, fibromyalgia. Patient independent prior to admission. Admitted to [facility] 10/03/2012 with right sided weakness and altered mental status. Cranial CT scan showed multifocal acute and subacute ischemia throughout the cerebral hemispheres bilaterally as compared to prior scanning from MRI outside  hospital 10/03/2012. Patient did not receive TPA. 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 Echocardiogram with ejection fraction of 60% without embolus. 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 Vascular surgery was consulted in reference to left carotid stenosis and underwent left carotid endarterectomy 10/06/2012 per Dr. Donnetta Hutching. Patient is presently maintained on aspirin therapy for CVA prophylaxis . She is tolerating a regular consistency diet with findings of mild hyperglycemia and hemoglobin A1c 6.1. Diet changed to carb modified and monitored with blood sugars a.c. and at bedtime. Physical therapy evaluation completed 10/04/2012 with recommendations for physical medicine rehabilitation consult to consider inpatient rehabilitation services. Patient was felt to be a good candidate for inpatient rehabilitation services and was admitted for comprehensive rehabilitation program. Patient transferred to CIR on 10/09/2012.   Pt presents with decreased activity tolerance, decreased functional mobility, decreased balance, decreased coordination, decreased safety, decreased problem solving Limiting pt's independence with leisure/community pursuits. Leisure History/Participation Premorbid leisure interest/current participation: Petra [place] - Flower gardening;Nature - Vegetable gardening;Community - Psychologist, forensic Other Leisure Interests: Computer Leisure Participation Style: With Family/Friends Psychosocial / Spiritual Spiritual Interests: Church Social interaction - Mood/Behavior: Restless (frustrated with limitation of hospital setting) [employer] Appropriate for Education?: Yes Recreational Therapy Orientation Orientation -Reviewed  with patient: Available activity resources Strengths/Weaknesses Patient Strengths/Abilities: Willingness to participate;Active premorbidly Patient  weaknesses: Physical limitations  Plan Rec Therapy Plan Is patient appropriate for Therapeutic Recreation?: Yes Rehab Potential: Good Treatment times per week: Min 1 time per week >20 minutes Estimated Length of Stay: 10 days TR Treatment/Interventions: Adaptive equipment instruction;1:1 session;Balance/vestibular training;Functional mobility training;Community reintegration;Patient/family education;Therapeutic activities;Recreation/leisure participation;Therapeutic exercise;UE/LE Coordination activities;Wheelchair propulsion/positioning;Visual/perceptual remediation/compensation Recommendations for other services: Neuropsych  Recommendations for other services: Neuropsych  Discharge Criteria: Patient will be discharged from TR if patient refuses treatment 3 consecutive times without medical reason.  If treatment goals not met, if there is a change in medical status, if patient makes no progress towards goals or if patient is discharged from hospital.  The above assessment, treatment plan, treatment alternatives and goals were discussed and mutually agreed upon: by patient  [place] 10/11/2012, 2:35 PM

## 2012-10-11 NOTE — Progress Notes (Signed)
Physical Therapy Session Note  Patient Details  Name: Kelli [name] MRN: [id number] Date of Birth: [birth year]  Today's Date: 10/11/2012 Time: 1100-1200 Time Calculation (min): 60 min  Short Term Goals: Week 1:  PT Short Term Goal 1 (Week 1): LTG=STG  Skilled Therapeutic Interventions/Progress Updates:    1:1. Focus this tx session on w/c propulsion and gait training. Reassessed proprioception of B LE with pt in supine on tx mat at start of therapy session. Pt able to accurately mirror w/ L LE when R LE positioned, however, demonstrated increased difficulty mirroring w/ R LE when L LE positioned. W/c switched to more appropriate size. Pt verbalized frustration regarding lack of self-mobility in room, re-educated regarding safety precautions. Addressed w/c propulsion w/ B LE to improve self-mobility. Obstacle course of cones set up for pt to negotiate through. Pt req mod A x1person to propel and negotiate around obstacles, and req consistent v/c + t/c to attend to obstacles on L side. Pt became emotional regarding difficulty of functional mobility and stated feeling, "like I have lost all sense of myself." Therapist provided emotional support. Pt able to negotiate up/down 8, 6" steps w/ use of R rail and min -mod A x1person. Pt req step-by-step cueing for sequencing of step-to pattern and safety during initial 4 steps, but demonstrated min v/c for remaining steps. Pt able to amb 300' at end of tx session w/ R HHA and mod A x1person, second person for w/c follow. Pt veering to R and pt stating that she felt to be veering to L. Pt cued to follow line on floor w/ L foot, pt demonstrated improved ability to amb straight with this cue. Pt w/ LOB when attempting to watch other individuals in hall, pt cued to attend to self only for safety and improved balance. Pt able to toilet at end of tx session w/ min guard to min A x1person. Pt sitting in w/c at end of tx session eating lunch w/ quick release belt in place.  Pt's friends in room.   Therapy Documentation Precautions:  Precautions Precautions: Fall Precaution Comments: poor visuospatial awareness    Pain: Pain Assessment Pain Assessment: No/denies pain  See FIM for current functional status  Therapy/Group: Individual Therapy  [name] 10/11/2012, 12:10 PM

## 2012-10-11 NOTE — Progress Notes (Signed)
Recreational Therapy Session Note  Patient Details  Name: Kelli [name] MRN: [id number] Date of Birth: 1950/09/11 Today's Date: 10/11/2012  Pt participated in animal assisted activity/therapy seated w/c level with supervision.  Rand Etchison 10/11/2012, 4:33 PM

## 2012-10-11 NOTE — Progress Notes (Signed)
Occupational Therapy Session Note  Patient Details  Name: Kelli [name] MRN: [id number] Date of Birth: 1950/05/29  Today's Date: 10/11/2012 Time: [id number] Time Calculation (min): 30 min  Short Term Goals: Week 1:  OT Short Term Goal 1 (Week 1): Patient will complete dressing with setup assistance OT Short Term Goal 2 (Week 1): Patient will complete bathing, seated, with supervision for safety OT Short Term Goal 3 (Week 1): Patient will perform ADL using left UE at diminshed level with verbal cues to initiate. OT Short Term Goal 4 (Week 1): Patient will demonstrate improved endurance as evidenced by ability to tolerate 60 minutes of ADL with only 1 rest break OT Short Term Goal 5 (Week 1): Patient will demonstrate ability to perform HEP with supervision.  Skilled Therapeutic Interventions/Progress Updates:    1:1 simple meal prep activity of making cookies from premade dough with focus on standing balance, sit to stand, standing tolerance, visual attention to left, left UE coordination with rolling cookie dough, functional ambulation with HHA around kitchen and unit to back to her room with min A. Pt with difficulty navigating back to her room- reporting she had never seen the RN station before and just got here from ICU and doesn't know where she is- requiring max A to find room. Pt difficulty with positioning self in bed  Laying across the bed but with no awareness of poor positioning.   Therapy Documentation Precautions:  Precautions Precautions: Fall Precaution Comments: poor visuospatial awareness Pain: Pain Assessment Pain Assessment: No/denies pain  See FIM for current functional status  Therapy/Group: Individual Therapy  Willeen Cass [facility] 10/11/2012, 2:49 PM

## 2012-10-12 ENCOUNTER — Inpatient Hospital Stay (HOSPITAL_COMMUNITY): Payer: 59 | Admitting: Speech Pathology

## 2012-10-12 ENCOUNTER — Encounter (HOSPITAL_COMMUNITY): Payer: 59 | Admitting: Occupational Therapy

## 2012-10-12 ENCOUNTER — Inpatient Hospital Stay (HOSPITAL_COMMUNITY): Payer: 59

## 2012-10-12 ENCOUNTER — Inpatient Hospital Stay (HOSPITAL_COMMUNITY): Payer: 59 | Admitting: *Deleted

## 2012-10-12 LAB — GLUCOSE, CAPILLARY: Glucose-Capillary: 140 mg/dL — ABNORMAL HIGH (ref 70–99)

## 2012-10-12 MED ORDER — BISACODYL 10 MG RE SUPP
10.0000 mg | Freq: Once | RECTAL | Status: AC
  Administered 2012-10-12: 10 mg via RECTAL
  Filled 2012-10-12: qty 1

## 2012-10-12 NOTE — Evaluation (Signed)
Speech Language Pathology Assessment and Plan  Patient Details  Name: Kelli [name] MRN: [id number] Date of Birth: 06-Dec-1950  SLP Diagnosis: Cognitive Impairments  Rehab Potential: Excellent ELOS: 1 week   Today's Date: 10/12/2012 Time: [id number] Time Calculation (min): 55 min  Skilled Therapeutic Intervention: Administered cognitive-linguistic evaluation. Please see below for details.   Problem List:  Patient Active Problem List   Diagnosis Date Noted  . CVA (cerebral infarction) 10/10/2012  . Occlusion and stenosis of carotid artery with cerebral infarction 10/05/2012  . Other and unspecified hyperlipidemia 10/05/2012  . Hyperglycemia 10/05/2012  . Renal insufficiency 10/05/2012  . Acute CVA (cerebrovascular accident) 10/04/2012  . Hypertension 10/04/2012  . Tobacco abuse 10/04/2012  . Obesity 10/04/2012   Past Medical History:  Past Medical History  Diagnosis Date  . Hypertension   . Fibromyalgia   . Stroke   . Cerebral infarction     multiple ischemic; both hemispheres/notes 10/03/2012 (10/04/2012)  . High cholesterol   . Borderline diabetes     "I come from a family of diabetics" (10/04/2012)  . Urinary incontinence     "last few days" (10/04/2012)   Past Surgical History:  Past Surgical History  Procedure Laterality Date  . Urethral dilation  1980's  . Combined hysterectomy vaginal w/ mmk / a&p repair  1981  . Vaginal hysterectomy  1981    "partial" (10/04/2012)  . Endarterectomy Left 10/06/2012    Procedure: Carotid Endarterectomy with Finesse patch angioplasty;  Surgeon: [name], MD;  Location: [facility] OR;  Service: Vascular;  Laterality: Left;    Assessment / Plan / Recommendation Clinical Impression  Pt is a 62 y.o. right-handed female with history of hypertension, tobacco abuse, fibromyalgia. Patient independent prior to admission. Admitted to [facility] 10/03/2012 with right sided weakness and altered mental status. Cranial CT scan showed multifocal acute  and subacute ischemia throughout the cerebral hemispheres bilaterally as compared to prior scanning from MRI outside hospital 10/03/2012. Patient did not receive TPA. 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 Echocardiogram with ejection fraction of 60% without embolus. 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 Vascular surgery was consulted in reference to left carotid stenosis and underwent left carotid endarterectomy 10/06/2012 per Dr. Donnetta Hutching. Patient is presently maintained on aspirin therapy for CVA prophylaxis . She is tolerating a regular consistency diet with findings of mild hyperglycemia and hemoglobin A1c 6.1. Diet changed to carb modified and monitored with blood sugars a.c. and at bedtime. Physical therapy evaluation completed 10/04/2012 with recommendations for physical medicine rehabilitation consult to consider inpatient rehabilitation services. Patient was felt to be a good candidate for inpatient rehabilitation services and was admitted for comprehensive rehabilitation program.  Patient transferred to CIR on 10/09/2012 and OT/PT report cognitive impairments, therefore, cognitive-linguistic evaluation was administered today. Pt presents with moderate cognitive impairments characterized by impaired selective attention, left inattention, working memory, functional problem solving, safety awareness, emergent awareness and delayed processing. Pt would benefit from skilled SLP intervention to maximize cognitive function and overall functional independence prior to d/c home.     SLP Assessment  Patient will need skilled [facility] during CIR admission    Recommendations  Recommendations for Other Services: Neuropsych consult Patient destination: Home Follow up Recommendations: 24 hour supervision/assistance;Outpatient SLP Equipment Recommended: None recommended by SLP    SLP Frequency  5 out of 7 days   SLP Treatment/Interventions Cognitive remediation/compensation;Cueing hierarchy;Functional tasks;Environmental controls;Internal/external aids;Patient/family  education;Therapeutic Activities    Pain Pain Assessment Pain Assessment: No/denies pain  Short Term Goals: Week 1: SLP Short Term Goal 1 (Week 1): Pt will demonstrate selective attention in a mildly distracting enviornment for 45 minutes with supervision verbal cues for redirection.  SLP Short Term Goal 2 (Week 1): Pt will attend to left field of enviornment/body during functional tasks with Min verbal and question cues.  SLP Short Term Goal 3 (Week 1): Pt will utilize external memory aids to recall new, daily information with supervision question and verbal cues.  SLP Short Term Goal 4 (Week 1): Pt will utilize call bell to express wants/needs with Min vebral cues.  SLP Short Term Goal 5 (Week 1): Pt will demonstrate functional problem solving with basic and familiar tasks with Min verbal and question cues.   See FIM for current functional status Refer to Care Plan for Long Term Goals  Recommendations for other services: Neuropsych  Discharge Criteria: Patient will be discharged from SLP if patient refuses treatment 3 consecutive times without medical reason, if treatment goals not met, if there is a change in medical status, if patient makes no progress towards goals or if patient is discharged from hospital.  The above assessment, treatment plan, treatment alternatives and goals were discussed and mutually agreed upon: by patient  Kelli [name] 10/12/2012, 3:45 PM

## 2012-10-12 NOTE — Plan of Care (Signed)
Problem: RH BOWEL ELIMINATION Goal: RH STG MANAGE BOWEL WITH ASSISTANCE STG Manage Bowel with supervision Assistance.  Outcome: Not Progressing LBM 10-08-12

## 2012-10-12 NOTE — Progress Notes (Signed)
Occupational Therapy Session Note  Patient Details  Name: Kelli [name] MRN: [id number] Date of Birth: [birth year]  Today's Date: 10/12/2012 Time: 0730-0830 Time Calculation (min): 60 min  Short Term Goals: Week 1:  OT Short Term Goal 1 (Week 1): Patient will complete dressing with setup assistance OT Short Term Goal 2 (Week 1): Patient will complete bathing, seated, with supervision for safety OT Short Term Goal 3 (Week 1): Patient will perform ADL using left UE at diminshed level with verbal cues to initiate. OT Short Term Goal 4 (Week 1): Patient will demonstrate improved endurance as evidenced by ability to tolerate 60 minutes of ADL with only 1 rest break OT Short Term Goal 5 (Week 1): Patient will demonstrate ability to perform HEP with supervision.  Skilled Therapeutic Interventions/Progress Updates:    1:1 When arrived in pt's room; pt c/o nausea and requested to rest for another 37min. Self care retraining with focus functional ambulation with attention to left with HHA, toileting, showering sitting on shower seat, Left UE coordination. Pt continues to present with dressing apraxia, visual spatial deficits, decr problem solving requiring mod to max A to dress. Pt would often report "I dont know how to put (this article of clothing) on." Pt requested to sit on toilet for increased amount of time but was able to have BM results. Half way through the session pt began to dry heave into the trash can. Reported to the RN.   Therapy Documentation Precautions:  Precautions Precautions: Fall Precaution Comments: poor visuospatial awareness General: General Amount of Missed OT Time (min): 15 Minutes Vital Signs:   Pain: Pain Assessment Pain Assessment: No/denies pain  See FIM for current functional status  Therapy/Group: Individual Therapy  Willeen Cass [facility] 10/12/2012, 3:44 PM

## 2012-10-12 NOTE — Progress Notes (Signed)
Occupational Therapy Session Note  Patient Details  Name: Kelli [name] MRN: [id number] Date of Birth: [birth year]  Today's Date: 10/12/2012 Time: 1032-1100 Time Calculation (min): 28 min  Short Term Goals: Week 1:  OT Short Term Goal 1 (Week 1): Patient will complete dressing with setup assistance OT Short Term Goal 2 (Week 1): Patient will complete bathing, seated, with supervision for safety OT Short Term Goal 3 (Week 1): Patient will perform ADL using left UE at diminshed level with verbal cues to initiate. OT Short Term Goal 4 (Week 1): Patient will demonstrate improved endurance as evidenced by ability to tolerate 60 minutes of ADL with only 1 rest break OT Short Term Goal 5 (Week 1): Patient will demonstrate ability to perform HEP with supervision.  Skilled Therapeutic Interventions/Progress Updates: Therapeutic activities with emphasis on neuromuscular re-ed, bilateral UE gross/fine motor control, improved activity tolerance and re-ed on CVA pathology relating to need for engagement in visuo-spatial, perceptual, and neuromotor re-ed using functional activities.  Patient maintains that her blurred vision is effecting her performance despite receiving feedback on her proprioceptive deficits during finger-nose test with left UE and both eyes closed.  RN alerted to patient request for additional assessment of vision, possible need for corrective glasses.    Therapy Documentation Precautions:  Precautions Precautions: Fall Precaution Comments: poor visuospatial awareness  Pain: Pain Assessment Pain Assessment: No/denies pain  See FIM for current functional status  Therapy/Group: Individual Therapy  Maryan Puls 10/12/2012, 11:56 AM

## 2012-10-12 NOTE — Progress Notes (Signed)
Physical Therapy Session Note  Patient Details  Name: Kelli [name] MRN: [id number] Date of Birth: 03-04-1950  Today's Date: 10/12/2012 Time: 1400-1500 Time Calculation (min): 60 min  Short Term Goals: Week 1:  PT Short Term Goal 1 (Week 1): LTG=STG  Skilled Therapeutic Interventions/Progress Updates:    1:1. Focus this tx session on awareness during amb, assessment of dynamic standing balance, stair negotiation and problem solving/sequencing. Pt standing with nurse tech in room upon entry, pt just finished washing hands after using the bathroom. Pt able to amb 250'x2 from room<>therapy gym w/ close (S) to min A x1person for balance, however, consistently bumping into obstacles and req max v/c + t/c to attend to obstacles in envionment on both L and R sides, but L side > R side. Pt almost ran into 3 people on her L side and stated that she thought they were walking into her. Pt consistently amb on L side despite cues to follow R rail. Pt able to negotiate up/down 12, 6" steps w/ R rail and combination of step-to and reciprocal pattern ascending, but step-to pattern descending. Pt demonstrated difficulty sequencing stair negotiation at first, but improved w/ single step commands. Pt also demonstrated inattention to positioning of L UE on rail when descending, req cues to attend to. Berg Balance Test performed and pt scored 35/56. Pt verbalized feeling "in shock" over her score and the current level of her balance. Pt re-educated regarding benefits of therapy to improve pt's impairments. At end of tx session, pt taken to therapy kitchen and asked to set table table. Pt demonstrated ideomotor apraxia during kitchen activity. Pt able to verbalize where plates, cups and silverware needed to be, but pt req consistent min cues and increased time for physical placement of items. Pt stated onset of dizziness at end of tx session, feeling like vertigo. RN made aware. Pt supine in bed at end of tx session, all needs  w/in reach and bed alarm on.   Therapy Documentation Precautions:  Precautions Precautions: Fall Precaution Comments: poor visuospatial awareness   Pain: Pain Assessment Pain Assessment: No/denies pain  See FIM for current functional status  Therapy/Group: Individual Therapy  [name] 10/12/2012, 3:33 PM

## 2012-10-12 NOTE — Patient Care Conference (Signed)
Inpatient RehabilitationTeam Conference and Plan of Care Update Date: 10/10/2012   Time: 2:45 PM    Patient Name: Kelli [name]      Medical Record Number: [id number]  Date of Birth: April 10, 1950 Sex: Female         Room/Bed: 4W18C/4W18C-01 Payor Info: Payor: Theme park manager / Plan: Theme park manager / Product Type: *No Product type* /    Admitting Diagnosis: WATERSHED INFARCTS L CEA  Admit Date/Time:  10/09/2012  4:45 PM Admission Comments: No comment available   Primary Diagnosis:  CVA (cerebral infarction) Principal Problem: CVA (cerebral infarction)  Patient Active Problem List   Diagnosis Date Noted  . CVA (cerebral infarction) 10/10/2012  . Occlusion and stenosis of carotid artery with cerebral infarction 10/05/2012  . Other and unspecified hyperlipidemia 10/05/2012  . Hyperglycemia 10/05/2012  . Renal insufficiency 10/05/2012  . Acute CVA (cerebrovascular accident) 10/04/2012  . Hypertension 10/04/2012  . Tobacco abuse 10/04/2012  . Obesity 10/04/2012    Expected Discharge Date: Expected Discharge Date: 10/19/12  Team Members Present: Physician leading conference: Dr. Alger Simons Social Worker Present: Alfonse Alpers, LCSW Nurse Present: Elliot Cousin, RN PT Present: Otis Brace, PT OT Present: Salome Spotted, OT;Roanna Epley, COTA;Kris Gellert, OT;Danali Marinos [place], OT SLP Present: Weston Anna, SLP     Current Status/Progress Goal Weekly Team Focus  Medical   bicerebral infarcts, processing delays and confusion, left inattention  increase functional mobility to allow partiaiption   vestibular assessment, confusion,    Bowel/Bladder   Continent bowel/bladder; frequency. PVR 0cc; u/a sent.  Decreased frequency,urgency; no s/s infection.  Monitor   Swallow/Nutrition/ Hydration             ADL's   Min assist with ADL due to visuo-spatial deficits, possible dressing apraxia  Supervision  Neuromuscular re-ed relating to fine/gross motor control of L-UE, cognitive  rehab (error detection and problem-solving)   Mobility   Min-mod A for mobility overall, decreased funcitonal endurance, visuospatial impairments  (I) bed mobility, (S) for dynamic standing balance, transfers bed<>w/c and car, amb in all environments, stair negotiation as well as cognition  Awarenss to body and environment on both L and R sides, amb endurance, safety during functional mobility   Communication             Safety/Cognition/ Behavioral Observations            Pain   Controlled with 1 Percocet. ( drowsy with 2 tabs.)  managed 2/10  monitor   Skin   Incision line CDI, mild edema  no s/s infection; no breakdown.  Monitor    Rehab Goals Patient on target to meet rehab goals: Yes Rehab Goals Revised: new patient to Rehab Unit *See Care Plan and progress notes for long and short-term goals.  Barriers to Discharge: spatial deficits    Possible Resolutions to Barriers:  NMR, cognitive perceptual rx    Discharge Planning/Teaching Needs:  Pt plans to return to her home to the care of her sister and husband.  Pt has good family support.      Team Discussion:  Team is concerned about pt's perceptual issues with limited attention to her left side.  She is also fading in overall strength about halfway through the treatment session.  Pt can be disoriented at times, but is easily re-oriented and recalls her confusion.  Pt to have a vestibular eval.  She is also not having quality sleep.    Revisions to Treatment Plan: New pt - goals are supervision level  Continued Need for Acute Rehabilitation Level of Care: The patient requires daily medical management by a physician with specialized training in physical medicine and rehabilitation for the following conditions: Daily direction of a multidisciplinary physical rehabilitation program to ensure safe treatment while eliciting the highest outcome that is of practical value to the patient.: Yes Daily medical management of patient  stability for increased activity during participation in an intensive rehabilitation regime.: Yes Daily analysis of laboratory values and/or radiology reports with any subsequent need for medication adjustment of medical intervention for : Neurological problems;Other (vestibular dysfunction)  Stephens Shreve, Silvestre Mesi 10/12/2012, 9:30 AM

## 2012-10-12 NOTE — Progress Notes (Signed)
Recreational Therapy Session Note  Patient Details  Name: Kelli [name] MRN: [id number] Date of Birth: 1950/11/27 Today's Date: 10/12/2012 Time:  1007-1040 Pain: no c/o, c/o N/V- RN aware Skilled Therapeutic Interventions/Progress Updates: Pt in bed upon arrival stating that she had a rough night, bowel issue/constipation with little relief. Pt stating that she thought she would have to take the day off due to N/V and limited sleep.  Reviewed with pt the need to participate in as much therapy as she could to truly benefit from CIR level therapies. Pt emotional/tearful with current medical issues and how they are impacting her life.  Pt states that she is questioning how much recovery she will make and when. Emotional support/coaching provided.  Pt smiling and appreciative of my visit at end of session.  Therapy/Group: Individual Therapy  Amol Domanski 10/12/2012, 12:17 PM

## 2012-10-12 NOTE — Progress Notes (Signed)
Subjective/Complaints: Feeling nauseas, constipated. Suppository not working A 12 point review of systems has been performed and if not noted above is otherwise negative.  Objective: Vital Signs: Blood pressure 171/79, pulse 72, temperature 97.2 F (36.2 C), temperature source Oral, resp. rate 19, height 5\' 5"  (1.651 m), weight 94.2 kg (207 lb 10.8 oz), SpO2 93.00%. No results found.  Recent Labs  10/10/12 0620  WBC 8.9  HGB 9.2*  HCT 27.5*  PLT 269    Recent Labs  10/10/12 0620  NA 139  K 3.7  CL 105  GLUCOSE 129*  BUN 22  CREATININE 1.46*  CALCIUM 8.8   CBG (last 3)   Recent Labs  10/11/12 1709 10/11/12 2111 10/12/12 0736  GLUCAP 143* 133* 140*    Wt Readings from Last 3 Encounters:  10/11/12 94.2 kg (207 lb 10.8 oz)  10/06/12 89.812 kg (198 lb)  10/06/12 89.812 kg (198 lb)    Physical Exam:  Constitutional: She is oriented to person, place, and time.  HENT:  Head: Normocephalic.  Eyes: EOM are normal.  Neck: Normal range of motion. Neck supple. No thyromegaly present.  Cardiovascular: Normal rate and regular rhythm.  Pulmonary/Chest: Effort normal and breath sounds normal. No respiratory distress. A few upper airway sounds. Abdominal: Soft. Bowel sounds are normal. She exhibits no distension.  Neurological: She is alert, oriented to name, [facility], reason she was here. Follows full commands. Decreased fine motor skills on the right. Delayed motor processing at times Skin: Skin is warm. Left carotid endarterectomy site clean and dry with Steri-Strips in place.  motor strength 5/5 in the right deltoid, bicep, tricep, grip, hip flexor, knee extensors, ankle dorsiflexor plantar flexor  4/5 left deltoid, bicep, tricep, grip, hip flexor, knee extensors, ankle dorsiflexor and plantar flexor  Cerebellar has continued past pointing with bilateral finger nose to finger. Left pronator drift. Extraocular muscles are intact. Nystagmus with tracking laterally  left/right Sitting balance is good  Sensation intact to light touch in both upper and lower extremities    Assessment/Plan: 1. Functional deficits secondary to bilateral watershed distribution infarcts which require 3+ hours per day of interdisciplinary therapy in a comprehensive inpatient rehab setting. Physiatrist is providing close team supervision and 24 hour management of active medical problems listed below. Physiatrist and rehab team continue to assess barriers to discharge/monitor patient progress toward functional and medical goals.    FIM: FIM - Bathing Bathing Steps Patient Completed: Chest;Right Arm;Left Arm;Abdomen;Front perineal area;Buttocks;Right upper leg;Left upper leg;Right lower leg (including foot);Left lower leg (including foot) Bathing: 4: Steadying assist (when standing)  FIM - Upper Body Dressing/Undressing Upper body dressing/undressing steps patient completed: Thread/unthread right sleeve of pullover shirt/dresss;Thread/unthread left sleeve of pullover shirt/dress;Put head through opening of pull over shirt/dress (including bra.Marland Kitchen) Upper body dressing/undressing: 2: Max-Patient completed 25-49% of tasks FIM - Lower Body Dressing/Undressing Lower body dressing/undressing steps patient completed: Thread/unthread left underwear leg;Don/Doff right shoe;Don/Doff left shoe Lower body dressing/undressing: 2: Max-Patient completed 25-49% of tasks  FIM - Toileting Toileting steps completed by patient: Adjust clothing prior to toileting;Performs perineal hygiene;Adjust clothing after toileting Toileting Assistive Devices: Grab bar or rail for support Toileting: 4: Steadying assist  FIM - Radio producer Devices: Grab bars Toilet Transfers: 4-To toilet/BSC: Min A (steadying Pt. > 75%);4-From toilet/BSC: Min A (steadying Pt. > 75%)  FIM - Bed/Chair Transfer Bed/Chair Transfer Assistive Devices: Bed rails Bed/Chair Transfer: 4: Sit > Supine:  Min A (steadying pt. > 75%/lift 1 leg);4: Bed > Chair or  W/C: Min A (steadying Pt. > 75%)  FIM - Locomotion: Wheelchair Distance: 50'x2 Locomotion: Wheelchair: 1: Travels less than 50 ft with moderate assistance (Pt: 50 - 74%) FIM - Locomotion: Ambulation Locomotion: Ambulation Assistive Devices: Other (comment) (R HHA) Ambulation/Gait Assistance: 3: Mod assist Locomotion: Ambulation: 3: Travels 150 ft or more with moderate assistance (Pt: 50 - 74%)  Comprehension Comprehension Mode: Auditory Comprehension: 5-Follows basic conversation/direction: With extra time/assistive device  Expression Expression Mode: Verbal Expression: 5-Expresses basic needs/ideas: With extra time/assistive device  Social Interaction Social Interaction: 5-Interacts appropriately 90% of the time - Needs monitoring or encouragement for participation or interaction.  Problem Solving Problem Solving: 4-Solves basic 75 - 89% of the time/requires cueing 10 - 24% of the time  Memory Memory: 4-Recognizes or recalls 75 - 89% of the time/requires cueing 10 - 24% of the time Medical Problem List and Plan:  1. 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 DVT Prophylaxis/Anticoagulation: SCDs. The patient is ambulatory  3. Pain Management/history fibromyalgia. Percocet as needed. Monitor with increased mobility  4. Mood/anxiety. Klonopin 0.5 mg twice a day as needed. Provide emotional support.  5. Neuropsych: This patient is capable of making decisions on her own behalf.  6. Left carotid endarterectomy status post 10/06/2012. Followup with vascular surgery  7. Hypertension. Norvasc 10 mg daily, clonidine 0.1 milligrams 3 times a day, Cardizem CD 360 mg daily. Monitor the increased mobility  8. Hyperlipidemia. Zocor  9. Tobacco abuse. Counseling  10. Hyperglycemia. Hemoglobin A1c 6.1. Check blood sugars a.c. and at bedtime. Diet changed to carb modified.--follow for  trend  11. Baseline renal insufficiency? 1.3-1.4--near baseline 12/ AMS--check urine--culture still pending. Improved    13. Constipation--SSE today  -aggressive daily regimen as well.    LOS (Days) 3 A FACE TO FACE EVALUATION WAS PERFORMED  Ilamae Geng T 10/12/2012 8:38 AM

## 2012-10-12 NOTE — Progress Notes (Signed)
Social Work Patient ID: Kelli [name], female   DOB: May 20, 1950, 62 y.o.   MRN: [id number]  CSW met with pt to update her on Team Conference.  Explained that pt's goals were for supervision overall, which is what she had expected.  She will have her sister with her 24/7 and husband is available when he is home from work.  Pt agreed with need for vestibular evaluation, as she has noticed changes.  Pt acknowledged that she is tired and not sleeping well at night and that this impacts her ability to work all the way through a therapy session.  She is hoping that improves.  CSW gave pt the targeted d/c date of 10-19-12 and she was surprised it was another week away.  CSW reminded her that she just arrived on the rehab unit 3 days ago and that it was expected she's be here 1-2 weeks. She expressed understanding.  CSW reminded her that the team meets again on Tuesday and if the date needs to changed, it can be.  Pt appreciates CSW visits and asked CSW to come back anytime.  CSW will continue to follow for support and assist as needed.

## 2012-10-13 ENCOUNTER — Inpatient Hospital Stay (HOSPITAL_COMMUNITY): Payer: 59

## 2012-10-13 ENCOUNTER — Inpatient Hospital Stay (HOSPITAL_COMMUNITY): Payer: 59 | Admitting: Speech Pathology

## 2012-10-13 ENCOUNTER — Encounter (HOSPITAL_COMMUNITY): Payer: 59 | Admitting: Occupational Therapy

## 2012-10-13 ENCOUNTER — Inpatient Hospital Stay (HOSPITAL_COMMUNITY): Payer: 59 | Admitting: Physical Therapy

## 2012-10-13 DIAGNOSIS — G8110 Spastic hemiplegia affecting unspecified side: Secondary | ICD-10-CM

## 2012-10-13 DIAGNOSIS — I6330 Cerebral infarction due to thrombosis of unspecified cerebral artery: Secondary | ICD-10-CM

## 2012-10-13 LAB — GLUCOSE, CAPILLARY
Glucose-Capillary: 131 mg/dL — ABNORMAL HIGH (ref 70–99)
Glucose-Capillary: 135 mg/dL — ABNORMAL HIGH (ref 70–99)

## 2012-10-13 LAB — URINE CULTURE

## 2012-10-13 MED ORDER — MAGIC MOUTHWASH
10.0000 mL | Freq: Three times a day (TID) | ORAL | Status: DC | PRN
  Administered 2012-10-13: 10 mL via ORAL
  Filled 2012-10-13: qty 10

## 2012-10-13 MED ORDER — NICOTINE 21 MG/24HR TD PT24
21.0000 mg | MEDICATED_PATCH | Freq: Every day | TRANSDERMAL | Status: DC
  Filled 2012-10-13 (×8): qty 1

## 2012-10-13 MED ORDER — CLONIDINE HCL 0.2 MG PO TABS
0.2000 mg | ORAL_TABLET | Freq: Three times a day (TID) | ORAL | Status: DC
  Administered 2012-10-13 – 2012-10-19 (×16): 0.2 mg via ORAL
  Filled 2012-10-13 (×21): qty 1

## 2012-10-13 NOTE — Progress Notes (Signed)
Occupational Therapy Session Note  Patient Details  Name: Kelli [name] MRN: [id number] Date of Birth: 26-Oct-1950  Today's Date: 10/13/2012 Time: 1331-1400 Time Calculation (min): 29 min  Short Term Goals: Week 1:  OT Short Term Goal 1 (Week 1): Patient will complete dressing with setup assistance OT Short Term Goal 2 (Week 1): Patient will complete bathing, seated, with supervision for safety OT Short Term Goal 3 (Week 1): Patient will perform ADL using left UE at diminshed level with verbal cues to initiate. OT Short Term Goal 4 (Week 1): Patient will demonstrate improved endurance as evidenced by ability to tolerate 60 minutes of ADL with only 1 rest break OT Short Term Goal 5 (Week 1): Patient will demonstrate ability to perform HEP with supervision.  Skilled Therapeutic Interventions: Therapeutic activities with emphasis on fine motor control of left UE during functional activity: participation with connect four game play with her sister, sustained attention in a distracting environment, functional mobility, problem-solving, and dynamic standing balance.  Patient able to push her w/c from behind from her room to rehab gym with close supervision and cues.  Patient continues to display delays with error recognition and problem-solving during functional mobility.   Patient arrived in highly distracting environment and received instruction on game play but required repeat of instructions on game play, verbal cues to develop strategies and to incorporate use of left hand, and redirection to attend to safety precautions (locking w/c brakes) and continued game play.   Patient was escorted back to room as she pushed her w/c from behind but was again challenged with problem-solving when presented with additional traffic and congestion in hallways due to distraction and reported flare-up of knee pain before completing mobility back to room.  Patient was returned to room in her w/c for the final 20' of  mobility.    Therapy Documentation Precautions:  Precautions Precautions: Fall Precaution Comments: poor visuospatial awareness  Pain: No report of pain  See FIM for current functional status  Therapy/Group: Individual Therapy  Maryan Puls 10/13/2012, 2:32 PM

## 2012-10-13 NOTE — Progress Notes (Signed)
Occupational Therapy Session Note  Patient Details  Name: Kelli [name] MRN: [id number] Date of Birth: 01/11/1951  Today's Date: 10/13/2012 Time: 0730-0825 Time Calculation (min): 55 min  Short Term Goals: Week 1:  OT Short Term Goal 1 (Week 1): Patient will complete dressing with setup assistance OT Short Term Goal 2 (Week 1): Patient will complete bathing, seated, with supervision for safety OT Short Term Goal 3 (Week 1): Patient will perform ADL using left UE at diminshed level with verbal cues to initiate. OT Short Term Goal 4 (Week 1): Patient will demonstrate improved endurance as evidenced by ability to tolerate 60 minutes of ADL with only 1 rest break OT Short Term Goal 5 (Week 1): Patient will demonstrate ability to perform HEP with supervision.  Skilled Therapeutic Interventions/Progress Updates:    1:1 self care retraining at sink level. Pt requested to wash up at the sink today. Positioned all clothing to don on table positioned to her left focusing her attention to the left. Functional ambulation around room with body positioning awareness to objects in environment, problem solving, and left Ue coordination. Mod cuing to visually attend to left UE to improve coordination with tasks. Pt requested extra time to problem solve through dressing apraxia and perceptual deficits with dressing- focusing getting the orientation of clothing correct in prep to don. Sister came half way through session- discussed how to provide cuing.   Therapy Documentation Precautions:  Precautions Precautions: Fall Precaution Comments: poor visuospatial awareness Pain:  no c/o pain   See FIM for current functional status  Therapy/Group: Individual Therapy  Willeen Cass [facility] 10/13/2012, 8:27 AM

## 2012-10-13 NOTE — Progress Notes (Signed)
Physical Therapy Session Note  Patient Details  Name: Kelli [name] MRN: [id number] Date of Birth: [birth year]  Today's Date: 10/13/2012 Time: 1530-1630 Time Calculation (min): 60 min  Short Term Goals: Week 1:  PT Short Term Goal 1 (Week 1): LTG=STG  Skilled Therapeutic Interventions/Progress Updates:   Gait training: beginning with RW x 100' in controlled environment with pt verbalizing awareness that she has tendency to run into objects on L side and needing to stay close to R side but when performing gait with RW continues to require mod A overall for visual scanning to L/head turns to L, to maintain safe distance and feet within RW and for problem solving around obstacles. During static standing and looking straight ahead pt reports seeing people on R side of hall but couldn't see chairs on L side of hall until brought to her attention.  Returning to room performed gait without AD in order to minimize number of things pt had to focus on and problem solve; pt required min A in controlled environment x 100' with min-mod verbal cues for obstacle negotiation and safety.    NMR: use of Wii bowling and Wii balance activities to focus on standing balance, attention to task and L side and selective and alternating attention/memory and problem solving.  Use of Wii bowling with RUE with focus on sequencing bowling task and scanning screen to remember # of pins left standing with min A overall for balance during dynamic UE movements.  Use of balance board board during tilt table and hip bump/math activity on WII to focus on weight shifting, visual perceptual input and problem solving tasks.  Pt had increased difficulty problem solving and selecting two numbers that add up to 10 on Wii game.  Through activity pt became more aware of cognitive impairments and became very upset. Assisted pt to mat to rest and to console and encourage patient.  Discussed with patient how fatigue and stress can affect problem  solving as well but also discussed real life situations where simple math is needed (check book, shopping).  Discussed with family speaking with primary OT, PT and SLP for other activities pt can do to continue to work on cognitive impairments.      Therapy Documentation Precautions:  Precautions Precautions: Fall Precaution Comments: poor visuospatial awareness Pain: Pain Assessment Pain Assessment: No/denies pain    See FIM for current functional status  Therapy/Group: Individual Therapy  [name] [facility] 10/13/2012, 5:28 PM

## 2012-10-13 NOTE — Progress Notes (Addendum)
Subjective/Complaints: Had a good night. Moved bowels substantially after suppository. Vision still "blurry" A 12 point review of systems has been performed and if not noted above is otherwise negative.  Objective: Vital Signs: Blood pressure 184/80, pulse 79, temperature 97.5 F (36.4 C), temperature source Oral, resp. rate 19, height 5\' 5"  (1.651 m), weight 94.2 kg (207 lb 10.8 oz), SpO2 92.00%. No results found. No results found for this basename: WBC, HGB, HCT, PLT,  in the last 72 hours No results found for this basename: NA, K, CL, CO, GLUCOSE, BUN, CREATININE, CALCIUM,  in the last 72 hours CBG (last 3)   Recent Labs  10/12/12 1126 10/12/12 1629 10/12/12 2056  GLUCAP 132* 96 212*    Wt Readings from Last 3 Encounters:  10/11/12 94.2 kg (207 lb 10.8 oz)  10/06/12 89.812 kg (198 lb)  10/06/12 89.812 kg (198 lb)    Physical Exam:  Constitutional: She is oriented to person, place, and time.  HENT:  Head: Normocephalic.  Eyes: EOM are normal.  Neck: Normal range of motion. Neck supple. No thyromegaly present.  Cardiovascular: Normal rate and regular rhythm.  Pulmonary/Chest: Effort normal and breath sounds normal. No respiratory distress. A few upper airway sounds. Abdominal: Soft. Bowel sounds are normal. She exhibits no distension.  Neurological: She is alert, oriented to name, [facility], reason she was here. Follows full commands. Decreased fine motor skills on the right. Delayed motor processing at times Skin: Skin is warm. Left carotid endarterectomy site clean and dry with Steri-Strips in place.  motor strength 5/5 in the right deltoid, bicep, tricep, grip, hip flexor, knee extensors, ankle dorsiflexor plantar flexor  4/5 left deltoid, bicep, tricep, grip, hip flexor, knee extensors, ankle dorsiflexor and plantar flexor  Cerebellar has continued past pointing with bilateral finger nose to finger. Left pronator drift. Mild left inattention. Extraocular muscles are  intact?--mild asymmetry in gaze?Marland Kitchen Nystagmus with tracking laterally left/right Sitting balance is good  Sensation intact to light touch in both upper and lower extremities    Assessment/Plan: 1. Functional deficits secondary to bilateral watershed distribution infarcts which require 3+ hours per day of interdisciplinary therapy in a comprehensive inpatient rehab setting. Physiatrist is providing close team supervision and 24 hour management of active medical problems listed below. Physiatrist and rehab team continue to assess barriers to discharge/monitor patient progress toward functional and medical goals.  Recommended formal visual evaluation 6 weeks or so after dc.    FIM: FIM - Bathing Bathing Steps Patient Completed: Chest;Right Arm;Left Arm;Abdomen;Front perineal area;Buttocks;Right upper leg;Left upper leg;Right lower leg (including foot);Left lower leg (including foot) Bathing: 4: Steadying assist (when standing)  FIM - Upper Body Dressing/Undressing Upper body dressing/undressing steps patient completed: Thread/unthread right sleeve of pullover shirt/dresss;Thread/unthread left sleeve of pullover shirt/dress;Put head through opening of pull over shirt/dress (including bra.Marland Kitchen) Upper body dressing/undressing: 2: Max-Patient completed 25-49% of tasks FIM - Lower Body Dressing/Undressing Lower body dressing/undressing steps patient completed: Thread/unthread left underwear leg;Don/Doff right shoe;Don/Doff left shoe Lower body dressing/undressing: 2: Max-Patient completed 25-49% of tasks  FIM - Toileting Toileting steps completed by patient: Adjust clothing prior to toileting;Performs perineal hygiene;Adjust clothing after toileting Toileting Assistive Devices: Grab bar or rail for support Toileting: 4: Steadying assist  FIM - Radio producer Devices: Grab bars Toilet Transfers: 4-To toilet/BSC: Min A (steadying Pt. > 75%);4-From toilet/BSC: Min A  (steadying Pt. > 75%)  FIM - Bed/Chair Transfer Bed/Chair Transfer Assistive Devices: Arm rests Bed/Chair Transfer: 4: Bed > Chair or W/C:  Min A (steadying Pt. > 75%);4: Chair or W/C > Bed: Min A (steadying Pt. > 75%)  FIM - Locomotion: Wheelchair Distance: 50'x2 Locomotion: Wheelchair: 0: Activity did not occur FIM - Locomotion: Ambulation Locomotion: Ambulation Assistive Devices: Other (comment) (none) Ambulation/Gait Assistance: 3: Mod assist Locomotion: Ambulation: 4: Travels 150 ft or more with minimal assistance (Pt.>75%)  Comprehension Comprehension Mode: Auditory Comprehension: 5-Follows basic conversation/direction: With extra time/assistive device  Expression Expression Mode: Verbal Expression: 5-Expresses basic needs/ideas: With extra time/assistive device  Social Interaction Social Interaction: 5-Interacts appropriately 90% of the time - Needs monitoring or encouragement for participation or interaction.  Problem Solving Problem Solving: 3-Solves basic 50 - 74% of the time/requires cueing 25 - 49% of the time  Memory Memory: 4-Recognizes or recalls 75 - 89% of the time/requires cueing 10 - 24% of the time Medical Problem List and Plan:  1. 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 DVT Prophylaxis/Anticoagulation: SCDs. The patient is ambulatory  3. Pain Management/history fibromyalgia. Percocet as needed. Monitor with increased mobility  4. Mood/anxiety. Klonopin 0.5 mg twice a day as needed. Provide emotional support.  5. Neuropsych: This patient is capable of making decisions on her own behalf.  6. Left carotid endarterectomy status post 10/06/2012. Followup with vascular surgery  7. Hypertension. Norvasc 10 mg daily, clonidine 0.1 milligrams 3 times a day, Cardizem CD 360 mg daily. Monitor the increased mobility  8. Hyperlipidemia. Zocor  9. Tobacco abuse. Counseling  10. Hyperglycemia. Hemoglobin A1c  6.1. Check blood sugars a.c. and at bedtime. Diet changed to carb modified.--follow for trend  11. Baseline renal insufficiency? 1.3-1.4--near baseline 12/ AMS--check urine--culture only 15k    13. Constipation--results with suppository  -aggressive daily regimen to continue    LOS (Days) 4 A FACE TO FACE EVALUATION WAS PERFORMED  SWARTZ,ZACHARY T 10/13/2012 8:20 AM

## 2012-10-13 NOTE — Progress Notes (Signed)
Speech Language Pathology Daily Session Note  Patient Details  Name: Kelli [name] MRN: [id number] Date of Birth: 1950-08-20  Today's Date: 10/13/2012 Time: [id number] Time Calculation (min): 45 min  Short Term Goals: Week 1: SLP Short Term Goal 1 (Week 1): Pt will demonstrate selective attention in a mildly distracting enviornment for 45 minutes with supervision verbal cues for redirection.  SLP Short Term Goal 2 (Week 1): Pt will attend to left field of enviornment/body during functional tasks with Min verbal and question cues.  SLP Short Term Goal 3 (Week 1): Pt will utilize external memory aids to recall new, daily information with supervision question and verbal cues.  SLP Short Term Goal 4 (Week 1): Pt will utilize call bell to express wants/needs with Min vebral cues.  SLP Short Term Goal 5 (Week 1): Pt will demonstrate functional problem solving with basic and familiar tasks with Min verbal and question cues.   Skilled Therapeutic Interventions: Treatment focus on cognitive goals. SLP facilitated session by providing Min A question and verbal cues to self-monitor and correct attention to left field of environment during reading and written expression tasks. Pt also required Min question and verbal cues for attention to LUE during functional tasks. Pt participated in clock drawing task with focus on visual-perceptual deficits and required Min question cues for functional problem solving with task and Mod verbal cues for organization of information. Pt's sister present throughout the session and demonstrated appropriate cueing.    FIM:  Comprehension Comprehension Mode: Auditory Comprehension: 5-Follows basic conversation/direction: With extra time/assistive device Expression Expression Mode: Verbal Expression: 5-Expresses basic needs/ideas: With extra time/assistive device Social Interaction Social Interaction: 5-Interacts appropriately 90% of the time - Needs monitoring or  encouragement for participation or interaction. Problem Solving Problem Solving: 4-Solves basic 75 - 89% of the time/requires cueing 10 - 24% of the time Memory Memory: 4-Recognizes or recalls 75 - 89% of the time/requires cueing 10 - 24% of the time  Pain Pain Assessment Pain Assessment: No/denies pain  Therapy/Group: Individual Therapy  Takina Busser, [place] 10/13/2012, 3:52 PM

## 2012-10-14 ENCOUNTER — Inpatient Hospital Stay (HOSPITAL_COMMUNITY): Payer: 59 | Admitting: Occupational Therapy

## 2012-10-14 ENCOUNTER — Inpatient Hospital Stay (HOSPITAL_COMMUNITY): Payer: 59

## 2012-10-14 MED ORDER — TRAMADOL HCL 50 MG PO TABS: 50.0000 mg | ORAL_TABLET | Freq: Two times a day (BID) | ORAL | Status: DC | PRN

## 2012-10-14 MED ORDER — ZOLPIDEM TARTRATE 5 MG PO TABS
5.0000 mg | ORAL_TABLET | Freq: Every evening | ORAL | Status: DC | PRN
  Administered 2012-10-14 – 2012-10-16 (×2): 5 mg via ORAL
  Filled 2012-10-14 (×2): qty 1

## 2012-10-14 NOTE — Progress Notes (Signed)
Occupational Therapy Note  Patient Details  Name: Kelli [name] MRN: [id number] Date of Birth: 11-09-1950 Today's Date: 10/14/2012  Time:  1600-1700  (60 min) Pain:  none groupl session  Engaged in neuromuscular upper extremity group with focus on LUE movements, attention, problem solving, visual attention.  Pt. Used LUE with max cues.  She kept forgetting to use and used RUE instead.  She showed good coordination with picking up tiles.  She had problems with perceptual motor with orientation, scanning to find the correct tile and problem solving to find where the tile was to be placed on the board.  She had difficulty counting the dots if more than 6 on a tile.  She needed reminders every turn which numbers to match.  Pt ambulated from and back to room with close supervision and no AD.  Pt demonstrated sustained attention for 15-30 seconds.      Lisa Roca 10/14/2012, 6:09 PM

## 2012-10-14 NOTE — Progress Notes (Signed)
Subjective/Complaints: Couldn't get comfortable. Had pain. Slept in the recliner but didn't help much A 12 point review of systems has been performed and if not noted above is otherwise negative.  Objective: Vital Signs: Blood pressure 160/70, pulse 63, temperature 98.1 F (36.7 C), temperature source Oral, resp. rate 18, height 5\' 5"  (1.651 m), weight 90.719 kg (200 lb), SpO2 96.00%. No results found. No results found for this basename: WBC, HGB, HCT, PLT,  in the last 72 hours No results found for this basename: NA, K, CL, CO, GLUCOSE, BUN, CREATININE, CALCIUM,  in the last 72 hours CBG (last 3)   Recent Labs  10/13/12 1646 10/13/12 2040 10/14/12 0746  GLUCAP 134* 135* 122*    Wt Readings from Last 3 Encounters:  10/13/12 90.719 kg (200 lb)  10/06/12 89.812 kg (198 lb)  10/06/12 89.812 kg (198 lb)    Physical Exam:  Constitutional: She is oriented to person, place, and time.  HENT:  Head: Normocephalic.  Eyes: EOM are normal.  Neck: Normal range of motion. Neck supple. No thyromegaly present.  Cardiovascular: Normal rate and regular rhythm.  Pulmonary/Chest: Effort normal and breath sounds normal. No respiratory distress. A few upper airway sounds. Abdominal: Soft. Bowel sounds are normal. She exhibits no distension.  Neurological: She is alert, oriented to name, [facility], reason she was here. Follows full commands. Decreased fine motor skills on the right. Delayed motor processing at times Skin: Skin is warm. Left carotid endarterectomy site clean and dry with Steri-Strips in place.  motor strength 5/5 in the right deltoid, bicep, tricep, grip, hip flexor, knee extensors, ankle dorsiflexor plantar flexor  4/5 left deltoid, bicep, tricep, grip, hip flexor, knee extensors, ankle dorsiflexor and plantar flexor  Cerebellar has continued past pointing with bilateral finger nose to finger. Left pronator drift. Mild left inattention. Extraocular muscles are intact?--mild asymmetry in  gaze?Marland Kitchen Nystagmus with tracking laterally left/right Sitting balance is good  Sensation intact to light touch in both upper and lower extremities    Assessment/Plan: 1. Functional deficits secondary to bilateral watershed distribution infarcts which require 3+ hours per day of interdisciplinary therapy in a comprehensive inpatient rehab setting. Physiatrist is providing close team supervision and 24 hour management of active medical problems listed below. Physiatrist and rehab team continue to assess barriers to discharge/monitor patient progress toward functional and medical goals.  Recommended formal visual evaluation 6 weeks or so after dc.    FIM: FIM - Bathing Bathing Steps Patient Completed: Chest;Right Arm;Left Arm;Abdomen;Front perineal area;Buttocks;Right upper leg;Left upper leg;Right lower leg (including foot);Left lower leg (including foot) Bathing: 5: Set-up assist to: Open items (when standing)  FIM - Upper Body Dressing/Undressing Upper body dressing/undressing steps patient completed: Thread/unthread right sleeve of pullover shirt/dresss;Thread/unthread left sleeve of pullover shirt/dress;Put head through opening of pull over shirt/dress;Pull shirt over trunk;Thread/unthread right bra strap;Thread/unthread left bra strap (including bra.Marland Kitchen) Upper body dressing/undressing: 4: Min-Patient completed 75 plus % of tasks FIM - Lower Body Dressing/Undressing Lower body dressing/undressing steps patient completed: Thread/unthread left underwear leg;Don/Doff right shoe;Don/Doff left shoe;Thread/unthread right underwear leg;Pull underwear up/down;Thread/unthread right pants leg;Thread/unthread left pants leg;Pull pants up/down Lower body dressing/undressing: 4: Min-Patient completed 75 plus % of tasks  FIM - Toileting Toileting steps completed by patient: Adjust clothing prior to toileting;Performs perineal hygiene;Adjust clothing after toileting Toileting Assistive Devices: Grab bar or  rail for support Toileting: 4: Steadying assist  FIM - Radio producer Devices: Grab bars Toilet Transfers: 4-To toilet/BSC: Min A (steadying Pt. > 75%);4-From  toilet/BSC: Min A (steadying Pt. > 75%)  FIM - Control and instrumentation engineer Devices: Walker;Arm rests Bed/Chair Transfer: 4: Chair or W/C > Bed: Min A (steadying Pt. > 75%);4: Bed > Chair or W/C: Min A (steadying Pt. > 75%)  FIM - Locomotion: Wheelchair Distance: 50'x2 Locomotion: Wheelchair: 0: Activity did not occur FIM - Locomotion: Ambulation Locomotion: Ambulation Assistive Devices: Other (comment) (none) Ambulation/Gait Assistance: 3: Mod assist Locomotion: Ambulation: 4: Travels 150 ft or more with minimal assistance (Pt.>75%)  Comprehension Comprehension Mode: Auditory Comprehension: 5-Follows basic conversation/direction: With extra time/assistive device  Expression Expression Mode: Verbal Expression: 5-Expresses basic needs/ideas: With extra time/assistive device  Social Interaction Social Interaction: 5-Interacts appropriately 90% of the time - Needs monitoring or encouragement for participation or interaction.  Problem Solving Problem Solving: 4-Solves basic 75 - 89% of the time/requires cueing 10 - 24% of the time  Memory Memory: 4-Recognizes or recalls 75 - 89% of the time/requires cueing 10 - 24% of the time Medical Problem List and Plan:  1. 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 DVT Prophylaxis/Anticoagulation: SCDs. The patient is ambulatory  3. Pain Management/history fibromyalgia. Percocet as needed. Tramadol for more moderate pain  4. Mood/anxiety. Klonopin 0.5 mg twice a day as needed. Provide emotional support.   -this impacts sleep as well as FMS 5. Neuropsych: This patient is capable of making decisions on her own behalf.  6. Left carotid endarterectomy status post 10/06/2012. Followup  with vascular surgery  7. Hypertension. Norvasc 10 mg daily, clonidine 0.1 milligrams 3 times a day, Cardizem CD 360 mg daily. Monitor the increased mobility  8. Hyperlipidemia. Zocor  9. Tobacco abuse. Counseling  10. Hyperglycemia. Hemoglobin A1c 6.1. Check blood sugars a.c. and at bedtime. Diet changed to carb modified.--follow for trend  11. Baseline renal insufficiency? 1.3-1.4--near baseline 12/ AMS--check urine--culture only 15k    13. Constipation--results with suppository  -aggressive daily regimen to continue 14. Insomnia--added prn ambien    LOS (Days) 5 A FACE TO FACE EVALUATION WAS PERFORMED  SWARTZ,ZACHARY T 10/14/2012 8:31 AM

## 2012-10-14 NOTE — Progress Notes (Signed)
Speech Language Pathology Daily Session Note  Patient Details  Name: Kelli [name] MRN: [id number] Date of Birth: [birth year]  Today's Date: 10/14/2012 Time: [id number] Time Calculation (min): 50 min  Short Term Goals: Week 1: SLP Short Term Goal 1 (Week 1): Pt will demonstrate selective attention in a mildly distracting enviornment for 45 minutes with supervision verbal cues for redirection.  SLP Short Term Goal 2 (Week 1): Pt will attend to left field of enviornment/body during functional tasks with Min verbal and question cues.  SLP Short Term Goal 3 (Week 1): Pt will utilize external memory aids to recall new, daily information with supervision question and verbal cues.  SLP Short Term Goal 4 (Week 1): Pt will utilize call bell to express wants/needs with Min vebral cues.  SLP Short Term Goal 5 (Week 1): Pt will demonstrate functional problem solving with basic and familiar tasks with Min verbal and question cues.   Skilled Therapeutic Interventions: Treatment focused on cognitive goals. SLP facilitated session by providing Mod cues for basic problem solving. Pt scanned to her left visual field with Min question cues during card game. Pt demonstrated selective attention with Supervision level cues for approximately 20 minutes in a minimally distracting environment. When the distractions increased, pt required Max cues to select her attention to the task at hand. At the end of the session, pt became tearful and emotional about her stroke. SLP provided words of encouragement and education.   FIM:  Comprehension Comprehension Mode: Auditory Comprehension: 5-Follows basic conversation/direction: With no assist Expression Expression Mode: Verbal Expression: 5-Expresses basic needs/ideas: With extra time/assistive device Social Interaction Social Interaction: 5-Interacts appropriately 90% of the time - Needs monitoring or encouragement for participation or interaction. Problem Solving Problem  Solving: 4-Solves basic 75 - 89% of the time/requires cueing 10 - 24% of the time Memory Memory: 4-Recognizes or recalls 75 - 89% of the time/requires cueing 10 - 24% of the time  Pain Pain Assessment Pain Assessment: No/denies pain  Therapy/Group: Individual Therapy  Germain Osgood 10/14/2012, 12:50 PM  Germain Osgood, M.A. CCC-SLP

## 2012-10-14 NOTE — Progress Notes (Signed)
Occupational Therapy Session Note  Patient Details  Name: Kelli [name] MRN: [id number] Date of Birth: [birth year]  Today's Date: 10/14/2012 Time: 1430-1500 Time Calculation (min): 30 min  Short Term Goals: Week 1:  OT Short Term Goal 1 (Week 1): Patient will complete dressing with setup assistance OT Short Term Goal 2 (Week 1): Patient will complete bathing, seated, with supervision for safety OT Short Term Goal 3 (Week 1): Patient will perform ADL using left UE at diminshed level with verbal cues to initiate. OT Short Term Goal 4 (Week 1): Patient will demonstrate improved endurance as evidenced by ability to tolerate 60 minutes of ADL with only 1 rest break OT Short Term Goal 5 (Week 1): Patient will demonstrate ability to perform HEP with supervision.  Skilled Therapeutic Interventions/Progress Updates:   Patient seen for PM occupational therapy session with emphasis on functional mobility transfer training, education, discharge planning, LUE neuromuscular re-education, and postural control.  Patient completes stand step ambulation from hospital room to ADL apartment with close SBA/supervision to simulate transfers into/out of tub/shower using transfer tub bench.  Patient educated on use of transfer tub bench for home to decrease risk for fall and increase independence with showers upon discharge.  Patient completes transfer into/out of tub/shower with use of transfer tub bench with back with verbal cues for technique.  [name] friend, Vaughan Basta, present and observes transfer training.  Patient recommended transfer tub bench upon discharge.  Patient completes stand step ambulation from ADL apartment to therapy gym with close SBA/supervision and verbal cues for timing and attention to task.  Patient completes LUE [facility] tasks in standing and sitting, with granddaughters, son, and daughter-in-law present, to place game chips in standing frame and thread small and medium size items on string for bimanual  hand task.  Patient with good hand strength but decreased coordination in LUE.  Patient ambulates back to hospital room with improved attention to task, as well as increased fatigue.  Patient requires verbal cues to not run into wall with right side.  Patient returns to sitting position in hospital room with family present for visiting.  Overall, patient motivated and making excellent progress toward goals.  Patient with fatigue level 8/10 on perceived exertion index at end of session.  Re-educated on energy conservation techniques to decrease fatigue throughout the day.  Therapy Documentation Precautions:  Precautions Precautions: Fall Precaution Comments: poor visuospatial awareness Pain:  Patient denies pain.  See FIM for current functional status  Therapy/Group: Individual Therapy  Osa Craver 10/14/2012, 4:28 PM

## 2012-10-14 NOTE — Progress Notes (Signed)
Occupational Therapy Session Note  Patient Details  Name: Kelli [name] MRN: [id number] Date of Birth: December 17, 1950  Today's Date: 10/14/2012 Time: 0800-0900 Time Calculation (min): 60 min  Short Term Goals: Week 1:  OT Short Term Goal 1 (Week 1): Patient will complete dressing with setup assistance OT Short Term Goal 2 (Week 1): Patient will complete bathing, seated, with supervision for safety OT Short Term Goal 3 (Week 1): Patient will perform ADL using left UE at diminshed level with verbal cues to initiate. OT Short Term Goal 4 (Week 1): Patient will demonstrate improved endurance as evidenced by ability to tolerate 60 minutes of ADL with only 1 rest break OT Short Term Goal 5 (Week 1): Patient will demonstrate ability to perform HEP with supervision.  Skilled Therapeutic Interventions/Progress Updates:   Patient seen for ADL retraining and instruction with emphasis on functional mobility, standing balance and tolerance, activity tolerance, functional use of LUE, L sided scanning, problem solving, attention, memory, and motor planning.  Patient gathers clothing with supervision/close SBA to CGA stand step ambulation without device.  Patient completes transfer into shower with CGA and shower bench to wheelchair after shower with CGA.  Patient completes bathing seated/standing with close SBA and verbal cues for use of L hand, sitting balance/posture and placement on seat, and verbal cues for safety.  Patient completes dressing wheelchair level at the sink.  Patient with decreased motor planning for upper body dressing, particularly with hooking bra and with directionality/donning of overhead shirt.  Patient able to complete donning of bra/shirt after demonstration and verbal cues.  Patient completes lower body dressing with CGA stand balance for clothing management over hips.  Patient educated on hemi-dressing techniques to don clothes on weaker side initially to increase independence and decrease  frustration with dressing.  Will require reinforcement.    Patient impulsive at times with decreased motor planning, problem solving, and memory.  Patient making good progress toward goals and would benefit from continued occupational therapy and inpatient rehabilitation.     Therapy Documentation Precautions:  Precautions Precautions: Fall Precaution Comments: poor visuospatial awareness Pain:  Patient reports pain in L top of foot as muscular "throbbing" pain but is unable to quantify pain level; therapy to tolerance.  See FIM for current functional status  Therapy/Group: Individual Therapy  Osa Craver 10/14/2012, 11:11 AM

## 2012-10-15 ENCOUNTER — Inpatient Hospital Stay (HOSPITAL_COMMUNITY): Payer: 59 | Admitting: *Deleted

## 2012-10-15 LAB — GLUCOSE, CAPILLARY: Glucose-Capillary: 129 mg/dL — ABNORMAL HIGH (ref 70–99)

## 2012-10-15 MED ORDER — FLEET ENEMA 7-19 GM/118ML RE ENEM
1.0000 | ENEMA | Freq: Every day | RECTAL | Status: DC | PRN
  Administered 2012-10-15: 1 via RECTAL
  Filled 2012-10-15: qty 1

## 2012-10-15 MED ORDER — BISACODYL 10 MG RE SUPP
10.0000 mg | Freq: Every day | RECTAL | Status: DC | PRN
  Administered 2012-10-15: 10 mg via RECTAL
  Filled 2012-10-15: qty 1

## 2012-10-15 NOTE — Progress Notes (Signed)
Patient reports sleeping a lot better last night after taking ambien and sleeeping on cotton sheets but woke up confused on a couple of occasions. bedalarm alerted staff 2 times that patient was getting OOB without assistance.Kelli [name] A

## 2012-10-15 NOTE — Progress Notes (Signed)
Physical Therapy Session Note  Patient Details  Name: Kelli [name] MRN: [id number] Date of Birth: March 15, 1950  Today's Date: 10/15/2012 Time: [id number] Time Calculation (min): 45 min    Skilled Therapeutic Interventions/Progress Updates:  Gait training with and w/o RW 1 x 120 feet and 1 x 200 feet ,min A due to visual deficits and difficulty in navigating in small spaces and around obstacles, obstacle course with picking up objects to increase focus and attention. Biodex training, Maze level 12 x 2 and stable base x1 to increase balance and focus. Nu Step 10 min level 4 to facilitate reciprocal movements, strength and endurance, patient has been able to complete exercise while being engaged in a meaningful conversation.     Therapy Documentation Precautions:  Precautions Precautions: Fall Precaution Comments: poor visuospatial awareness Restrictions Weight Bearing Restrictions: No Vital Signs: Therapy Vitals Temp: 98.2 F (36.8 C) Temp src: Oral Pulse Rate: 75 Resp: 20 BP: 157/81 mmHg Patient Position, if appropriate: Sitting Oxygen Therapy SpO2: 96 % O2 Device: None (Room air)  See FIM for current functional status  Therapy/Group: Individual Therapy  Guadlupe Spanish 10/15/2012, 3:29 PM

## 2012-10-15 NOTE — Discharge Summary (Addendum)
Physician Discharge Summary  JAYELLE CAVENDISH [id number] DOB: January 07, 1951 DOA: 10/03/2012  PCP: Manon Hilding, MD  Admit date: 10/03/2012 Discharge date: 10/15/2012  Time spent: greater than 30 minutes  Discharge Diagnoses:  Principal Problem:   Acute CVA (cerebrovascular accident) Active Problems:   Occlusion and stenosis of carotid artery with cerebral infarction   Hypertension   Obesity   Hyperglycemia   Tobacco abuse   Other and unspecified hyperlipidemia   Renal insufficiency  Discharge Condition: stable  Filed Weights   10/04/12 0027 10/04/12 0131 10/06/12 0400  Weight: 89.359 kg (197 lb) 89.54 kg (197 lb 6.4 oz) 89.812 kg (198 lb)   History of present illness:  Kelli [name] is a 62 y.o. female, right handed, with a past medical history significant for HTN, smoking, and fibromyalgia, sent from [facility] 10/03/2012 for further evaluation. MRI brain showed bilateral watershed infarcts.  On 8/2 she started noticing some unusual fatigue associated with right arm numbness, poor balance, disorientation, and was confused to the point that she was not able to connect things. She also had blurred vision and a sensation of heaviness in both arms. She never had similar symptoms before and decided to go to to [facility] ED the next day where she had a CT brain that showed no acute intracranial abnormality. She was discharged from the ED 8/3.  Her symptoms persisted [facility]  Patient was not a TPA candidate secondary to delay in arrival from symptom onset. She was admitted for further evaluation and treatment. Workup significant for right ICA occlusion. Critical left ICA stenosis. Had CEA 8/8. Started on aspirin and statin.  HGB A1C 6.1, but blood glucoses running rather high. 120-190s.  Antihypertensives being adjusted postop for accelerated hypertension. Accepted at inpatient rehab today.  Procedures: Left carotid endarterectomy  Consultations:  Neuro  Vascular  surgery  Discharge Exam: Filed Vitals:   10/09/12 1601  BP:   Pulse:   Temp: 98.7 F (37.1 C)  Resp:     General: alert, oriented Cardiovascular: RRR without MGR Respiratory: CTA without WRR  Discharge Instructions   Future Appointments Provider Department Dept Phone   10/16/2012 7:30 AM Merrilee Seashore, [place] A [phone]   10/16/2012 8:45 AM Buzzy Han, [place] A [phone]   10/16/2012 9:30 AM [place] A [phone]   10/16/2012 1:00 PM [place] A [phone]   10/16/2012 2:00 PM [facility] Neuro Psychologist [place] A [phone]   10/31/2012 11:15 AM [name], MD [facility] [phone]     [START ON 10/10/2012] amLODipine  10 mg  Oral  Daily  .  aspirin EC  325 mg  Oral  Daily  .  cloNIDine  0.1 mg  Oral  TID  .  diltiazem  360 mg  Oral  Daily  .  docusate sodium  100 mg  Oral  Daily  .  famotidine  20 mg  Oral  Daily  .  potassium chloride SA  20 mEq  Oral  Daily  .  simvastatin  10 mg  Oral  q1800   Allergies  Allergen Reactions  . Hydrochlorothiazide Nausea And Vomiting       Follow-up Information   Follow up with EARLY, TODD, MD In 3 weeks. (  office will arrange-sent)    Specialty:  Vascular Surgery   Contact information:   [address] [place] [address] [phone]       Follow up with Forbes Cellar, MD. Schedule an appointment as soon as possible for a visit in 2 months. (stroke clinic)    Specialties:  Neurology, Radiology   Contact information:   [address] [place]  [address] [phone]        The results of significant diagnostics from this hospitalization (including imaging, microbiology, ancillary and  laboratory) are listed below for reference.    Significant Diagnostic Studies: Dg Chest 2 View  10/01/2012   *RADIOLOGY REPORT*  Clinical Data: Hypertension, left arm weakness  CHEST - 2 VIEW  Comparison: None.  Findings: Normal heart size and vascularity.  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  Trachea is midline.  Negative for CHF or pneumonia.  No collapse or consolidation.  Negative for effusion or pneumothorax.  Trachea is midline.  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 Annamaria Boots, M.D.   Ct Head Wo Contrast  10/03/2012   *RADIOLOGY REPORT*  Clinical Data: Dizziness.  CT HEAD WITHOUT CONTRAST  Technique:  Contiguous axial images were obtained from the base of the skull through the vertex without contrast.  Comparison: MRI of the brain 10/03/2012.  Findings: There are multifocal ill-defined areas of decreased attenuation throughout the deep and periventricular white matter of the cerebral hemispheres bilaterally.  While some of these are compatible with chronic microvascular ischemic changes, many are more focal and lower density than typically seen in the setting of chronic microvascular ischemia, and are compatible with evolving acute/subacute infarctions. Similar findings are also found in the subcortical white matter and portions of the cortex bilaterally. No acute intracranial hemorrhage.  No evidence of mass, mass effect, hydrocephalus or abnormal intra or extra-axial fluid collections.  Visualized paranasal sinuses and mastoids are well pneumatized.  No acute displaced skull fractures are noted.  IMPRESSION: 1.  Multi focal acute/subacute ischemia throughout the cerebral hemispheres bilaterally, similar to recent brain MRI 10/03/2012. Findings are again concerning for multifocal embolic infarcts from potential cardiac source.  Echocardiographic  correlation for evidence of intracardiac thrombus or vegetation from infective endocarditis is strongly recommended.  Critical Value/emergent results were called by telephone at the time of interpretation on 10/03/2012 at 08:58 p.m. to Dr. Rogene Houston, who verbally acknowledged these results.   Original Report Authenticated By: Vinnie Langton, M.D.   Ct Head Wo Contrast  10/01/2012   *RADIOLOGY REPORT*  Clinical Data: Hypertension, weakness  CT HEAD WITHOUT CONTRAST  Technique:  Contiguous axial images were obtained from the base of the skull through the vertex without contrast.  Comparison: None.  Findings: Patchy moderate white matter microvascular ischemic changes throughout both cerebral hemispheres.  No definite acute intracranial hemorrhage, infarction, mass lesion, midline shift, herniation, hydrocephalus, or extra-axial fluid collection.  No focal mass effect or edema.  Cisterns are patent.  No cerebellar abnormality.  Symmetric orbits.  Mastoids and sinuses are clear.  IMPRESSION: Chronic microvascular ischemic changes in the white matter.  No acute process by noncontrast CT.   Original Report Authenticated By: Jerilynn Mages. Annamaria Boots, M.D.   Ct Angio Neck W/cm &/or Wo/cm  10/05/2012   *RADIOLOGY REPORT*  Clinical Data:  Left-sided weakness.  CVA.  CT ANGIOGRAPHY NECK  Technique:  Multidetector CT imaging of the neck was performed using the standard  protocol during bolus administration of intravenous contrast.  Multiplanar CT image reconstructions including MIPs were obtained to evaluate the vascular anatomy. Carotid stenosis measurements (when applicable) are obtained utilizing NASCET criteria, using the distal internal carotid diameter as the denominator.  Contrast: 82mL OMNIPAQUE IOHEXOL 350 MG/ML SOLN  Comparison:   MRI brain 10/03/2012.  MRA head 10/04/2012.  Findings:  There is a combined origin of the left common carotid artery and the innominate artery.  Extensive atherosclerotic irregularity is present  throughout the aortic arch.  This is predominately soft plaque with ulcerations. There is soft tissue plaque along the anterior margin of the ascending innominate artery.  The left subclavian artery is occluded 12 mm after its origin. There is flow in the left vertebral artery which may be reversed. The left subclavian artery is opacified.  The right vertebral artery is the dominant vessel.  There is mild tortuosity proximally without significant stenosis.  Additional tortuosity is present at the C2-3 level on the right without significant stenosis.  The right vertebral artery is dominant throughout the neck.  Vessel size is diminished to below the level of C6 on the left.  The left subclavian artery is reconstituted by both the left vertebral artery and the high cervical artery.  The right common carotid artery is within normal limits.  The right internal carotid artery is occluded at its origin.  There is a high- grade stenosis of the proximal right external carotid artery.  Atherosclerotic calcifications are present at the origin of the left common carotid artery without a significant stenosis.  A high- grade, near occlusive stenosis is present in the proximal left internal carotid artery essentially a string sign extending for 13 mm.  The more distal left internal carotid is smaller than expected in caliber at 3.5 mm.  There is minimal flow within the petrous right internal carotid artery, but no flow below the skull base.  The transverse sinuses are opacified.  Limited imaging of the brain is unremarkable.   Review of the MIP images confirms the above findings.  IMPRESSION:  1.  Occluded right internal carotid artery with slight reconstitution at the petrous segment. 2.  High-grade, near occlusive proximal left internal carotid artery stenosis. 3.  Occluded left subclavian artery. 4.  Normal appearance of the right vertebral artery.  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 5.  Decreased vessel diameter of the left vertebral artery below the C6 segment, suggesting a focal stenosis.  These results were called by telephone on 10/05/2012 at 08:25 a.m. to Dr. Lianne Moris nurse in the operating room, who verbally acknowledged these results.   Original Report Authenticated By: San Morelle, M.D.   Mr [facility] Wo Contrast  10/04/2012   *RADIOLOGY REPORT*  Clinical Data: Stroke  MRA HEAD WITHOUT CONTRAST  Technique: Angiographic images of the Circle of Willis were obtained using MRA technique without intravenous contrast.  Comparison: MRI 10/03/2012  Findings: Both vertebral arteries are patent to the basilar, the right vertebral artery is dominant.  PICA not visualized.  AICA patent bilaterally.  Superior cerebellar arteries are patent bilaterally.  Right posterior cerebral artery is patent with mild irregularity distally.  There is a moderate stenosis of the proximal left posterior cerebral artery which is patent distally.  The right internal carotid artery is occluded.  The right anterior and middle cerebral arteries are supplied from the anterior communicating artery and have decreased signal due to low flow. Left posterior communicating artery  is patent.  Right posterior communicating artery is not visualized.  There is probable irregularity in the right middle cerebral artery branches which could be thrombus or atherosclerotic disease.  The left middle cerebral artery is patent without significant with mild irregularity.  Both anterior cerebral arteries are patent.  Negative for aneurysm.  Image quality degraded by patient motion.  IMPRESSION: Occluded right internal carotid artery.  The right anterior and middle cerebral arteries are supplied by the anterior communicating artery.  There is probable atherosclerotic disease in the middle cerebral arteries bilaterally.  There is a moderate stenosis of the proximal left posterior cerebral artery.   Original Report Authenticated  By: Carl Best, M.D.    Microbiology: Recent Results (from the past 240 hour(s))  SURGICAL PCR SCREEN     Status: None   Collection Time    10/06/12  4:15 AM      Result Value Range Status   MRSA, PCR NEGATIVE  NEGATIVE Final   Staphylococcus aureus NEGATIVE  NEGATIVE Final   Comment:            The Xpert SA Assay (FDA     approved for NASAL specimens     in patients over 33 years of age),     is one component of     a comprehensive surveillance     program.  Test performance has     been validated by [employer] for patients greater     than or equal to 38 year old.     It is not intended     to diagnose infection nor to     guide or monitor treatment.  URINE CULTURE     Status: None   Collection Time    10/11/12  6:45 AM      Result Value Range Status   Specimen Description URINE, CLEAN CATCH   Final   Special Requests NONE   Final   Culture  Setup Time     Final   Value: 10/11/2012 06:45     Performed at [facility]     Final   Value: 15,000 COLONIES/ML     Performed at [facility]   Culture     Final   Value: Multiple bacterial morphotypes present, none predominant. Suggest appropriate recollection if clinically indicated.     Performed at [facility]   Report Status 10/13/2012 FINAL   Final     Labs: Basic Metabolic Panel:  Recent Labs Lab 10/09/12 0430 10/10/12 0620  NA 138 139  K 4.1 3.7  CL 105 105  CO2 21 23  GLUCOSE 138* 129*  BUN 18 22  CREATININE 1.32* 1.46*  CALCIUM 8.4 8.8   Liver Function Tests:  Recent Labs Lab 10/10/12 0620  AST 14  ALT 12  ALKPHOS 65  BILITOT 0.4  PROT 5.8*  ALBUMIN 2.4*   No results found for this basename: LIPASE, AMYLASE,  in the last 168 hours No results found for this basename: AMMONIA,  in the last 168 hours CBC:  Recent Labs Lab 10/09/12 0430 10/10/12 0620  WBC 11.0* 8.9  NEUTROABS  --  5.2  HGB 10.1* 9.2*  HCT 29.6* 27.5*  MCV 87.1 87.9  PLT 259  269   Cardiac Enzymes: No results found for this basename: CKTOTAL, CKMB, CKMBINDEX, TROPONINI,  in the last 168 hours BNP: BNP (last 3 results) No results found for this basename: PROBNP,  in  the last 8760 hours CBG:  Recent Labs Lab 10/14/12 0746 10/14/12 2119 10/15/12 0743 10/15/12 1108 10/15/12 1651  GLUCAP 122* 167* 129* 158* 110*   Echo: Left ventricle: The cavity size was normal. Systolic function was normal. The estimated ejection fraction was in the range of 55% to 60%. Wall motion was normal; there were no regional wall motion abnormalities. There was an increased relative contribution of atrial contraction to ventricular filling. - Mitral valve: Calcified annulus.  EKG: Normal sinus rhythm Minimal voltage criteria for LVH, may be normal variant Septal infarct , age undetermined  Carotid doppler Preliminary report: Right: Occluded internal carotid artery. Left: Greater than 80% internal carotid artery stenosis. Right: Vertebral artery flow is antegrade. Left: Vertebral artery retrograde.   SignedDelfina Redwood  [facility] 10/15/2012, 6:52 PM

## 2012-10-15 NOTE — Progress Notes (Signed)
Subjective/Complaints: Had a much better night. ambien helped. Still some confusion at times A 12 point review of systems has been performed and if not noted above is otherwise negative.  Objective: Vital Signs: Blood pressure 168/50, pulse 72, temperature 97.8 F (36.6 C), temperature source Oral, resp. rate 20, height 5\' 5"  (1.651 m), weight 90.719 kg (200 lb), SpO2 96.00%. No results found. No results found for this basename: WBC, HGB, HCT, PLT,  in the last 72 hours No results found for this basename: NA, K, CL, CO, GLUCOSE, BUN, CREATININE, CALCIUM,  in the last 72 hours CBG (last 3)   Recent Labs  10/14/12 0746 10/14/12 2119 10/15/12 0743  GLUCAP 122* 167* 129*    Wt Readings from Last 3 Encounters:  10/13/12 90.719 kg (200 lb)  10/06/12 89.812 kg (198 lb)  10/06/12 89.812 kg (198 lb)    Physical Exam:  Constitutional: She is oriented to person, place, and time.  HENT:  Head: Normocephalic.  Eyes: EOM are normal.  Neck: Normal range of motion. Neck supple. No thyromegaly present.  Cardiovascular: Normal rate and regular rhythm.  Pulmonary/Chest: Effort normal and breath sounds normal. No respiratory distress. A few upper airway sounds. Abdominal: Soft. Bowel sounds are normal. She exhibits no distension.  Neurological: She is alert, oriented to name, [facility], reason she was here. Follows full commands. Decreased fine motor skills on the right. Delayed motor processing at times Skin: Skin is warm. Left carotid endarterectomy site clean and dry with Steri-Strips in place.  motor strength 5/5 in the right deltoid, bicep, tricep, grip, hip flexor, knee extensors, ankle dorsiflexor plantar flexor  4/5 left deltoid, bicep, tricep, grip, hip flexor, knee extensors, ankle dorsiflexor and plantar flexor  Cerebellar has continued past pointing with bilateral finger nose to finger. Left pronator drift. Mild left inattention. Extraocular muscles are intact?--mild asymmetry in gaze?Marland Kitchen  Nystagmus with tracking laterally left/right Sitting balance is good  Sensation intact to light touch in both upper and lower extremities    Assessment/Plan: 1. Functional deficits secondary to bilateral watershed distribution infarcts which require 3+ hours per day of interdisciplinary therapy in a comprehensive inpatient rehab setting. Physiatrist is providing close team supervision and 24 hour management of active medical problems listed below. Physiatrist and rehab team continue to assess barriers to discharge/monitor patient progress toward functional and medical goals.  Recommended formal visual evaluation 6 weeks or so after dc.    FIM: FIM - Bathing Bathing Steps Patient Completed: Chest;Right Arm;Left Arm;Abdomen;Front perineal area;Buttocks;Right upper leg;Left upper leg;Right lower leg (including foot);Left lower leg (including foot) Bathing: 5: Set-up assist to: Adjust water temp (supervision/verbal cues)  FIM - Upper Body Dressing/Undressing Upper body dressing/undressing steps patient completed: Thread/unthread right bra strap;Thread/unthread left bra strap;Hook/unhook bra;Thread/unthread right sleeve of pullover shirt/dresss;Thread/unthread left sleeve of pullover shirt/dress;Put head through opening of pull over shirt/dress;Pull shirt over trunk Upper body dressing/undressing: 5: Supervision: Safety issues/verbal cues (demonstration/verbal cues) FIM - Lower Body Dressing/Undressing Lower body dressing/undressing steps patient completed: Thread/unthread right underwear leg;Thread/unthread left underwear leg;Pull underwear up/down;Thread/unthread right pants leg;Thread/unthread left pants leg;Pull pants up/down;Don/Doff left shoe;Don/Doff right shoe Lower body dressing/undressing: 4: Steadying Assist (verbal cues)  FIM - Toileting Toileting steps completed by patient: Adjust clothing prior to toileting;Performs perineal hygiene;Adjust clothing after toileting Toileting  Assistive Devices: Grab bar or rail for support Toileting: 4: Steadying assist  FIM - Radio producer Devices: Grab bars Toilet Transfers: 4-To toilet/BSC: Min A (steadying Pt. > 75%);4-From toilet/BSC: Min A (steadying Pt. >  75%)  FIM - Control and instrumentation engineer Devices: Walker;Arm rests Bed/Chair Transfer: 4: Chair or W/C > Bed: Min A (steadying Pt. > 75%);4: Bed > Chair or W/C: Min A (steadying Pt. > 75%)  FIM - Locomotion: Wheelchair Distance: 50'x2 Locomotion: Wheelchair: 0: Activity did not occur FIM - Locomotion: Ambulation Locomotion: Ambulation Assistive Devices: Other (comment) (none) Ambulation/Gait Assistance: 3: Mod assist Locomotion: Ambulation: 4: Travels 150 ft or more with minimal assistance (Pt.>75%)  Comprehension Comprehension Mode: Auditory Comprehension: 5-Follows basic conversation/direction: With no assist  Expression Expression Mode: Verbal Expression: 5-Expresses basic needs/ideas: With extra time/assistive device  Social Interaction Social Interaction: 5-Interacts appropriately 90% of the time - Needs monitoring or encouragement for participation or interaction.  Problem Solving Problem Solving: 4-Solves basic 75 - 89% of the time/requires cueing 10 - 24% of the time  Memory Memory: 4-Recognizes or recalls 75 - 89% of the time/requires cueing 10 - 24% of the time Medical Problem List and Plan:  1. 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 DVT Prophylaxis/Anticoagulation: SCDs. The patient is ambulatory  3. Pain Management/history fibromyalgia. Percocet as needed. Tramadol for more moderate pain  4. Mood/anxiety. Klonopin 0.5 mg twice a day as needed. Provide emotional support.   -this impacts sleep as well as FMS 5. Neuropsych: This patient is capable of making decisions on her own behalf.  6. Left carotid endarterectomy status post  10/06/2012. Followup with vascular surgery  7. Hypertension. Norvasc 10 mg daily, clonidine 0.1 milligrams 3 times a day, Cardizem CD 360 mg daily. Monitor the increased mobility  8. Hyperlipidemia. Zocor  9. Tobacco abuse. Counseling  10. Hyperglycemia. Hemoglobin A1c 6.1. Check blood sugars a.c. and at bedtime. Diet changed to carb modified.--follow for trend  11. Baseline renal insufficiency? 1.3-1.4--near baseline 12/ AMS--improving, still an issue at night more than any other time, anxiety component   13. Constipation--results with suppository  -aggressive daily regimen to continue 14. Insomnia--added prn ambien which helped a lot last night    LOS (Days) 6 A FACE TO FACE EVALUATION WAS PERFORMED  Jovan Colligan T 10/15/2012 8:03 AM

## 2012-10-16 ENCOUNTER — Inpatient Hospital Stay (HOSPITAL_COMMUNITY): Payer: 59

## 2012-10-16 ENCOUNTER — Encounter (HOSPITAL_COMMUNITY): Payer: 59

## 2012-10-16 ENCOUNTER — Encounter (HOSPITAL_COMMUNITY): Payer: 59 | Admitting: Occupational Therapy

## 2012-10-16 ENCOUNTER — Inpatient Hospital Stay (HOSPITAL_COMMUNITY): Payer: 59 | Admitting: Speech Pathology

## 2012-10-16 ENCOUNTER — Inpatient Hospital Stay (HOSPITAL_COMMUNITY): Payer: 59 | Admitting: Physical Therapy

## 2012-10-16 LAB — GLUCOSE, CAPILLARY
Glucose-Capillary: 137 mg/dL — ABNORMAL HIGH (ref 70–99)
Glucose-Capillary: 138 mg/dL — ABNORMAL HIGH (ref 70–99)
Glucose-Capillary: 115 mg/dL — ABNORMAL HIGH (ref 70–99)

## 2012-10-16 NOTE — Progress Notes (Addendum)
Physical Therapy Session Note  Patient Details  Name: Kelli [name] MRN: [id number] Date of Birth: 1950/04/08  Today's Date: 10/16/2012 Time: 0935-1000 Time Calculation (min): 25 min  Short Term Goals: Week 2:  PT Short Term Goal 1 (Week 2): LTGs=STGs  Skilled Therapeutic Interventions/Progress Updates:    1:1. Focus this tx session on awareness/scanning to both sides of environment in a familiar location to improve awareness, specifically to L side. Pt able to amb 200'x2 room<>therapy kitchen w/ close (S). In therapy kitchen, therapist placed sticky notes numbered 1-20 around the room and pt was asked to find them in numerical order. Pt instructed to grab numbers 11-20 w/ L UE only. Activity again completed w/ numbers 1-15 placed around kitchen, pt instructed to find odd numbers only and reach w/ L UE. Pt req close (S) during activity and req intermittent min v/c to locate number needed when on L side of environment. When reaching w/ L UE, no overt visuospatial deficits noted.  Pt w/ improved environmental awareness overall this tx session.   Therapy Documentation Precautions:  Precautions Precautions: Fall Precaution Comments: poor visuospatial awareness Restrictions Weight Bearing Restrictions: No  Pain: Pain Assessment Pain Assessment: No/denies pain See FIM for current functional status  Therapy/Group: Individual Therapy  [name] 10/16/2012, 10:35 AM

## 2012-10-16 NOTE — Progress Notes (Addendum)
Occupational Therapy Session Note  Patient Details  Name: Kelli [name] MRN: [id number] Date of Birth: [birth year]  Today's Date: 10/16/2012 Time: 0730-0825 Time Calculation (min): 55 min  Short Term Goals: Week 1:  OT Short Term Goal 1 (Week 1): Patient will complete dressing with setup assistance OT Short Term Goal 2 (Week 1): Patient will complete bathing, seated, with supervision for safety OT Short Term Goal 3 (Week 1): Patient will perform ADL using left UE at diminshed level with verbal cues to initiate. OT Short Term Goal 4 (Week 1): Patient will demonstrate improved endurance as evidenced by ability to tolerate 60 minutes of ADL with only 1 rest break OT Short Term Goal 5 (Week 1): Patient will demonstrate ability to perform HEP with supervision.  Skilled Therapeutic Interventions/Progress Updates:    1:1 self care retraining at shower level with focus on safe functional ambulation around room without AD with close supervision. Safety plan changed to ambulate to BR without RW. Focus on simple problem solving with all tasks to overall deficits with visual /preceptual deficits. Pt required no cuing with dressing LB or shirt but continues to require visual and verbal cues for orientation for donning bra. Continued focus on left UE coordination with all tasks. Discussed safety with all tasks including at the hospital and at home;p pt verbalizes safety recomendations not to get up on her own but over the week continues to forget in the moment (decr emergent awareness). Encouragement to continue to try with all tasks instead of readily asking for assistance to complete task.  Therapy Documentation Precautions:  Precautions Precautions: Fall Precaution Comments: poor visuospatial awareness Restrictions Weight Bearing Restrictions: No Pain:  no c/o pain   See FIM for current functional status  Therapy/Group: Individual Therapy  Willeen Cass [facility] [place] 10/16/2012, 8:04 AM

## 2012-10-16 NOTE — Progress Notes (Signed)
Speech Language Pathology Daily Session Note  Patient Details  Name: Kelli [name] MRN: [id number] Date of Birth: April 28, 1950  Today's Date: 10/16/2012 Time: [id number] Time Calculation (min): 45 min  Short Term Goals: Week 1: SLP Short Term Goal 1 (Week 1): Pt will demonstrate selective attention in a mildly distracting enviornment for 45 minutes with supervision verbal cues for redirection.  SLP Short Term Goal 2 (Week 1): Pt will attend to left field of enviornment/body during functional tasks with Min verbal and question cues.  SLP Short Term Goal 3 (Week 1): Pt will utilize external memory aids to recall new, daily information with supervision question and verbal cues.  SLP Short Term Goal 4 (Week 1): Pt will utilize call bell to express wants/needs with Min vebral cues.  SLP Short Term Goal 5 (Week 1): Pt will demonstrate functional problem solving with basic and familiar tasks with Min verbal and question cues.   Skilled Therapeutic Interventions: Treatment focus on cognitive goals. SLP facilitated session by providing Min A question cues to self-monitor and correct errors during 6 step sequencing task with picture cards.  Pt also required extra time and Min A semantic cues for visual scanning task in a moderately visually distracting environment. Pt demonstrated appropriate emergent awareness into difficulty of task but required Min question cues for safety awareness and need for utilization of call bell to request wants/needs.    FIM:  Comprehension Comprehension Mode: Auditory Comprehension: 5-Follows basic conversation/direction: With no assist Expression Expression Mode: Verbal Expression: 5-Expresses basic needs/ideas: With extra time/assistive device Social Interaction Social Interaction: 6-Interacts appropriately with others with medication or extra time (anti-anxiety, antidepressant). Problem Solving Problem Solving: 4-Solves basic 75 - 89% of the time/requires cueing 10 -  24% of the time Memory Memory: 4-Recognizes or recalls 75 - 89% of the time/requires cueing 10 - 24% of the time FIM - Eating Eating Activity: 5: Supervision/cues  Pain Pain Assessment Pain Assessment: No/denies pain  Therapy/Group: Individual Therapy  Kennard Fildes 10/16/2012, 9:53 AM

## 2012-10-16 NOTE — Progress Notes (Signed)
Subjective/Complaints: Moved bowels yesterday--feels better. Able to sleep afterwards without ambien A 12 point review of systems has been performed and if not noted above is otherwise negative.  Objective: Vital Signs: Blood pressure 180/79, pulse 74, temperature 98 F (36.7 C), temperature source Oral, resp. rate 19, height 5\' 5"  (1.651 m), weight 90.719 kg (200 lb), SpO2 94.00%. No results found. No results found for this basename: WBC, HGB, HCT, PLT,  in the last 72 hours No results found for this basename: NA, K, CL, CO, GLUCOSE, BUN, CREATININE, CALCIUM,  in the last 72 hours CBG (last 3)   Recent Labs  10/15/12 1108 10/15/12 1651 10/15/12 2126  GLUCAP 158* 110* 110*    Wt Readings from Last 3 Encounters:  10/13/12 90.719 kg (200 lb)  10/06/12 89.812 kg (198 lb)  10/06/12 89.812 kg (198 lb)    Physical Exam:  Constitutional: She is oriented to person, place, and time.  HENT:  Head: Normocephalic.  Eyes: EOM are normal.  Neck: Normal range of motion. Neck supple. No thyromegaly present.  Cardiovascular: Normal rate and regular rhythm.  Pulmonary/Chest: Effort normal and breath sounds normal. No respiratory distress. A few upper airway sounds. Abdominal: Soft. Bowel sounds are normal. She exhibits no distension.  Neurological: She is alert, oriented to name, [facility], reason she was here. Follows full commands. Decreased fine motor skills on the right. Delayed motor processing at times Skin: Skin is warm. Left carotid endarterectomy site clean and dry with Steri-Strips in place.  motor strength 5/5 in the right deltoid, bicep, tricep, grip, hip flexor, knee extensors, ankle dorsiflexor plantar flexor  4/5 left deltoid, bicep, tricep, grip, hip flexor, knee extensors, ankle dorsiflexor and plantar flexor  Cerebellar has continued past pointing with bilateral finger nose to finger. Left pronator drift. Mild left inattention. Extraocular muscles are intact?--mild asymmetry in  gaze?Marland Kitchen Nystagmus with tracking laterally left/right Sitting balance is good  Sensation intact to light touch in both upper and lower extremities    Assessment/Plan: 1. Functional deficits secondary to bilateral watershed distribution infarcts which require 3+ hours per day of interdisciplinary therapy in a comprehensive inpatient rehab setting. Physiatrist is providing close team supervision and 24 hour management of active medical problems listed below. Physiatrist and rehab team continue to assess barriers to discharge/monitor patient progress toward functional and medical goals.  Recommended formal visual evaluation 6 weeks or so after dc.    FIM: FIM - Bathing Bathing Steps Patient Completed: Chest;Right Arm;Left Arm;Abdomen;Front perineal area;Buttocks;Right upper leg;Left upper leg;Right lower leg (including foot);Left lower leg (including foot) Bathing: 5: Set-up assist to: Adjust water temp (supervision/verbal cues)  FIM - Upper Body Dressing/Undressing Upper body dressing/undressing steps patient completed: Thread/unthread right bra strap;Thread/unthread left bra strap;Hook/unhook bra;Thread/unthread right sleeve of pullover shirt/dresss;Thread/unthread left sleeve of pullover shirt/dress;Put head through opening of pull over shirt/dress;Pull shirt over trunk Upper body dressing/undressing: 5: Supervision: Safety issues/verbal cues (demonstration/verbal cues) FIM - Lower Body Dressing/Undressing Lower body dressing/undressing steps patient completed: Thread/unthread right underwear leg;Thread/unthread left underwear leg;Pull underwear up/down;Thread/unthread right pants leg;Thread/unthread left pants leg;Pull pants up/down;Don/Doff left shoe;Don/Doff right shoe Lower body dressing/undressing: 4: Steadying Assist (verbal cues)  FIM - Toileting Toileting steps completed by patient: Adjust clothing prior to toileting;Performs perineal hygiene;Adjust clothing after toileting Toileting  Assistive Devices: Grab bar or rail for support Toileting: 4: Steadying assist  FIM - Radio producer Devices: Grab bars Toilet Transfers: 4-To toilet/BSC: Min A (steadying Pt. > 75%);4-From toilet/BSC: Min A (steadying Pt. > 75%)  FIM - Control and instrumentation engineer Devices: Environmental consultant;Arm rests Bed/Chair Transfer: 5: Bed > Chair or W/C: Supervision (verbal cues/safety issues);5: Chair or W/C > Bed: Supervision (verbal cues/safety issues)  FIM - Locomotion: Wheelchair Distance: 50'x2 Locomotion: Wheelchair: 0: Activity did not occur FIM - Locomotion: Ambulation Locomotion: Ambulation Assistive Devices: Administrator Ambulation/Gait Assistance: 4: Min assist Locomotion: Ambulation: 4: Travels 150 ft or more with minimal assistance (Pt.>75%)  Comprehension Comprehension Mode: Auditory Comprehension: 5-Follows basic conversation/direction: With no assist  Expression Expression Mode: Verbal Expression: 5-Expresses basic needs/ideas: With extra time/assistive device  Social Interaction Social Interaction: 5-Interacts appropriately 90% of the time - Needs monitoring or encouragement for participation or interaction.  Problem Solving Problem Solving: 4-Solves basic 75 - 89% of the time/requires cueing 10 - 24% of the time  Memory Memory: 4-Recognizes or recalls 75 - 89% of the time/requires cueing 10 - 24% of the time Medical Problem List and Plan:  1. 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 DVT Prophylaxis/Anticoagulation: SCDs. The patient is ambulatory  3. Pain Management/history fibromyalgia. Percocet as needed. Tramadol for more moderate pain  4. Mood/anxiety. Klonopin 0.5 mg twice a day as needed. Provide emotional support.   -this impacts sleep as well as FMS 5. Neuropsych: This patient is capable of making decisions on her own behalf.  6. Left carotid endarterectomy  status post 10/06/2012. Followup with vascular surgery  7. Hypertension. Norvasc 10 mg daily, clonidine 0.1 milligrams 3 times a day, Cardizem CD 360 mg daily. Monitor the increased mobility  8. Hyperlipidemia. Zocor  9. Tobacco abuse. Counseling  10. Hyperglycemia. Hemoglobin A1c 6.1. Check blood sugars a.c. and at bedtime. Diet changed to carb modified.--follow for trend  11. Baseline renal insufficiency? 1.3-1.4--near baseline 12/ AMS--improving, still an issue at night more than any other time, anxiety component   13. Constipation--results yesterday  -work on daily regimen 14. Insomnia--added prn ambien which helped a lot last night    LOS (Days) 7 A FACE TO FACE EVALUATION WAS PERFORMED  SWARTZ,ZACHARY T 10/16/2012 7:24 AM

## 2012-10-16 NOTE — Progress Notes (Signed)
At [place] gave scheduled senna s and PRN sorbitol. Patient complained of feeling constipated. Patient had refused sorbitol on previous night. Paged Dr. Naaman Plummer for supp and/or enema. Also, reported about BP of 178/98.  Dulcolax supp given at 2154. Up to Bathroom, disimpacted for moderate hard stool. Patient tearful and anxious. Patient complained of rectum still feeling full, fleets enema given at 2258 with large hard results after disimpacting again. Blood noted on tissue when wiping and in toilet. Large and moderate BM during night. Reports feeling much better this AM. Reports taking miralax daily at home. Patient anxious about bedalarm being on, "feels like I'm in prison." Explained rationale for using. Saturday night patient attempted OOB 2 times, last night called appropriately. Refusing to use RW, "it gets in my way." Impulsive at times, stopping to pick things up off floor. Tearful about situation. Obvious memory deficits. Disoriented at night. Slept good last night without ambien.Patrici Ranks A

## 2012-10-16 NOTE — Progress Notes (Signed)
Physical Therapy Assessment and Plan  Patient Details  Name: Kelli [name] MRN: [id number] Date of Birth: 06-22-1950  PT Diagnosis: Cognitive deficits, Dizziness and giddiness and Impaired cognition   Today's Date: 10/16/2012 Time: 1300-1405 Time Calculation (min): 65 min  Problem List:  Patient Active Problem List   Diagnosis Date Noted  . CVA (cerebral infarction) 10/10/2012  . Occlusion and stenosis of carotid artery with cerebral infarction 10/05/2012  . Other and unspecified hyperlipidemia 10/05/2012  . Hyperglycemia 10/05/2012  . Renal insufficiency 10/05/2012  . Acute CVA (cerebrovascular accident) 10/04/2012  . Hypertension 10/04/2012  . Tobacco abuse 10/04/2012  . Obesity 10/04/2012    Past Medical History:  Past Medical History  Diagnosis Date  . Hypertension   . Fibromyalgia   . Stroke   . Cerebral infarction     multiple ischemic; both hemispheres/notes 10/03/2012 (10/04/2012)  . High cholesterol   . Borderline diabetes     "I come from a family of diabetics" (10/04/2012)  . Urinary incontinence     "last few days" (10/04/2012)   Past Surgical History:  Past Surgical History  Procedure Laterality Date  . Urethral dilation  1980's  . Combined hysterectomy vaginal w/ mmk / a&p repair  1981  . Vaginal hysterectomy  1981    "partial" (10/04/2012)  . Endarterectomy Left 10/06/2012    Procedure: Carotid Endarterectomy with Finesse patch angioplasty;  Surgeon: [name], MD;  Location: [facility] OR;  Service: Vascular;  Laterality: Left;    Assessment & Plan Clinical Impression: Kelli [name] is a 62 y.o. right-handed female with history of hypertension, tobacco abuse, fibromyalgia. Patient independent prior to admission. Admitted to [facility] 10/03/2012 with right sided weakness and altered mental status. Cranial CT scan showed multifocal acute and subacute ischemia throughout the cerebral hemispheres bilaterally as compared to prior scanning from MRI outside hospital  10/03/2012. Patient did not receive TPA. 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 Echocardiogram with ejection fraction of 60% without embolus. 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 Vascular surgery was consulted in reference to left carotid stenosis and underwent left carotid endarterectomy 10/06/2012 per Dr. Donnetta Hutching. Patient is presently maintained on aspirin therapy for CVA prophylaxis . She is tolerating a regular consistency diet with findings of mild hyperglycemia and hemoglobin A1c 6.1. Diet changed to carb modified and monitored with blood sugars a.c. and at bedtime. Patient transferred to CIR on 10/09/2012 .   Pt reports multiple bouts of intense vertigo over past few years which pt took Meclizine for during the episode, rested for ~ a day and then was much better. No h/o migraines. Sister reports that pt had a few dizzy spells prior to most recent stroke however no true bouts of vertigo and pt reports that she took her Meclizine "as soon as she felt off" because she was trying to ward off a vertigo "spell." Pt reports a recent decline in visual clarity, blurring of visual field with mid to far vision. Also reports h/o cataracts. Pt reports symptoms are not worsened by rolling or changes in position. Pt reports sometimes if she moves her head too quickly she feels "imbalance." Vestibular exam findings below:   Eye Alignment  WFL  Spontaneous  Nystagmus  Negative  Gaze holding nystagmus  Negative  Smooth pursuit  WFL   Oculomotor  Appears to have conjugate movement but reports some double vision with Lt. Visual field - although not  consistent, sometimes has normal vision. Difficulty with convergence   Saccades  Impaired in Lt. Visual field  Head Thrust Test  Impaired but difficult to assess due to attention  Head Shaking Nystagmus Produced concordant dizziness but unable to see any nystagmus (no Frenzel  glasses)  Rt. Hallpike Dix Modified - Negative  Lt. Hallpike Dix Modified - Negative  Rt. Roll Test Not tested d/t no symptoms with bed mobility or position changes  Lt. Roll Test  Not tested d/t no symptoms with bed mobility or position changes  Motion sensitivity No significant problems      Visual- Vestibular Interactions:  - Sitting: Difficulty with maintaining target while maintaining head movements, some dizziness.  - Standing: Some dizziness, more impaired than with sitting.  - Walking: Impaired, dizziness  Patient will benefit from skilled PT intervention to maximize safe functional mobility, minimize fall risk and decrease caregiver burden. Anticipate patient will benefit from follow up OP at discharge.     Recommendations  Pt has poor saccades to the lt. And double vision (inconsistently) with Lt. Visual field which are consistent with central neurological damage and may be correlated with difficulty with vision to her Lt. Side. 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 Due to her new cognitive impairments she has decreased ability to compensate for prior and new deficits. She has c/o blurry vision that she reports has been progressive and not significantly worsened by recent events, question if this is more a result of worsening cataracts vs. Impaired gaze stabilization. She will definitely benefit from neuro-ophthalmology evaluation post D/C for further evaluation.   Therapies should work on:  1] Saccades: sister practiced with pt, handout provided.  2] VOR x 1 exercises in sitting and standing to help minimize input discrepancies from unilateral vestibular hypofunction. Handouts provided.   All exercises practiced with PT.   PT Evaluation Pain No c/o pain  Refer to Care Plan for Long Term Goals  Recommendations for other services:  None  Discharge Criteria: Patient will be discharged from PT if patient refuses treatment 3 consecutive times without medical reason, if treatment goals not met, if there is a change in medical status, if patient makes no progress towards goals or if patient is discharged from hospital.  The above assessment, treatment plan, treatment alternatives and goals were discussed and mutually agreed upon: by patient  Lahoma Rocker 10/16/2012, 3:27 PM

## 2012-10-17 ENCOUNTER — Encounter (HOSPITAL_COMMUNITY): Payer: 59 | Admitting: Occupational Therapy

## 2012-10-17 ENCOUNTER — Inpatient Hospital Stay (HOSPITAL_COMMUNITY): Payer: 59 | Admitting: Speech Pathology

## 2012-10-17 ENCOUNTER — Inpatient Hospital Stay (HOSPITAL_COMMUNITY): Payer: 59

## 2012-10-17 LAB — GLUCOSE, CAPILLARY: Glucose-Capillary: 125 mg/dL — ABNORMAL HIGH (ref 70–99)

## 2012-10-17 NOTE — Progress Notes (Signed)
Occupational Therapy Session Note  Patient Details  Name: Kelli [name] MRN: [id number] Date of Birth: 11-15-1950  Today's Date: 10/17/2012 Time: [id number] Time Calculation (min): 23 min  Short Term Goals: Week 1:  OT Short Term Goal 1 (Week 1): Patient will complete dressing with setup assistance OT Short Term Goal 2 (Week 1): Patient will complete bathing, seated, with supervision for safety OT Short Term Goal 3 (Week 1): Patient will perform ADL using left UE at diminshed level with verbal cues to initiate. OT Short Term Goal 4 (Week 1): Patient will demonstrate improved endurance as evidenced by ability to tolerate 60 minutes of ADL with only 1 rest break OT Short Term Goal 5 (Week 1): Patient will demonstrate ability to perform HEP with supervision.  Skilled Therapeutic Interventions/Progress Updates: Therapeutic activities with emphasis on functional mobility, endurance, car transfers, and patient ed on home activities for continuned cognitive remediation.   Patient ambulated from her room to Trans-sit car simulator, approx 300', with good stride and pace, no evidence of instability, and min verbal cues to avoid congested pathway.   Car simulator height was adjusted to approximate height of [employer] with patient's son and sister estimating through trial.  Patient related prior use of hand strap and performed transfer without LOB or evidence of confusion.   Patient plans to continue cognitive remediation with use of word puzzles, games, and sorting of 24 years of personal files, with her husbands assistance.    Therapy Documentation Precautions:  Precautions Precautions: Fall Precaution Comments: poor visuospatial awareness Restrictions Weight Bearing Restrictions: No  Vital Signs: Therapy Vitals Temp: 97.5 F (36.4 C) Temp src: Oral Pulse Rate: 67 Resp: 22 BP: 152/60 mmHg Patient Position, if appropriate: Sitting Oxygen Therapy SpO2: 99 % O2 Device: None (Room  air)  Pain: Pain Assessment Pain Assessment: Faces Faces Pain Scale: Hurts a little bit Pain Location: Leg Pain Orientation: Right;Left Pain Descriptors / Indicators: Cramping Pain Intervention(s): RN made aware;Repositioned (Rest and B LE elevation)  See FIM for current functional status  Therapy/Group: Individual Therapy  Maryan Puls 10/17/2012, 3:27 PM

## 2012-10-17 NOTE — Progress Notes (Signed)
Physical Therapy Session Note  Patient Details  Name: Kelli [name] MRN: [id number] Date of Birth: March 20, 1950  Today's Date: 10/17/2012 Time:Treatment Session [id number]; Treatment Session 2: 1400-1440 Time Calculation (min): Treatment Session 1:60 min; Treatment Session 2: 65min  Short Term Goals: Week 1:  PT Short Term Goal 1 (Week 1): LTG=STG  Skilled Therapeutic Interventions/Progress Updates:   Treatment Session 1: 1:1. Focus this tx session on family training w/ pt's sister, Amy. Pt and sister educated at length regarding s/s of CVA, CVA recovery, f/u physical therapy recommendations/benefits, remaining impairments and safety during functional mobility in home and community environments. Pt's sister received demonstration of as well as performed safe demonstration of stair negotiation in controlled and community environment as well as amb in controlled and community environments w/ pt. Pt trial negotiate up/down 3, 6" steps w/out use of rail vs. W/ use of rail. Therapist provided min A for balance w/out use of rail and close (S) with rail. Family asking recommendation regarding placement of rail in home environment as it does not currently have one. Therapist discussed benefits of rail installation and recommended rail for increased safety as well as increased level of independence when pt negotiating steps. Pt req close (S) when negotiate up/down 12 steps in community environment w/ use of single rail. Pt req consistent cues to attend to L UE when using rail. Pt able to amb 500', 300' in controlled and community environments w/ close (S), however, req HHA w/ min A x1person at end of tx session when amb 200' back to room. Pt w/ reports of fatigue and feeling of numbness/tingling in B LE at end of tx session. Pt's sister reports that pt experienced these sensations PTA. RN made aware. Pt sitting in recliner at end of tx session, sister in room, quick release belt in place and all needs in reach.    Treatment Session 2: 1:1. Focus this tx session on family training w/ pt's son and sister present. Pt able to amb 700'x2 bouts w/ 1 seated rest break during each bout. Amb performed in busy/community environments on hard level and uneven outside surfaces including mulch. Pt req close (S) to min guard during amb w/ min cues to avoid obstacles in environment L > R side. Pt's son verbalized understanding of safe walking with pt in busy environments and pt verbalized understanding to take rest breaks as needed for safety and endurance. Pt's family also educated on performance of safe car t/f in actual car that pt will go home in due to height of truck. Pt req min A x1person for safe car t/f. Pt noted onset of cramping in B LE at end of tx session, RN aware and in at end of tx session. Pt seated in recliner w/ B LE elevated, nurse tech also in room to do vital intake.   Therapy Documentation Precautions:  Precautions Precautions: Fall Precaution Comments: poor visuospatial awareness Restrictions Weight Bearing Restrictions: No  Pain: Pain Assessment Pain Assessment: No/denies pain Pain Score: 0-No pain Pain Location: Foot Pain Orientation: Right;Left (bilateral feet) Pain Descriptors / Indicators: Tingling;Numbness Pain Intervention(s): RN made aware  See FIM for current functional status  Therapy/Group: Individual Therapy  [name] 10/17/2012, 10:54 AM

## 2012-10-17 NOTE — Progress Notes (Signed)
Social Work Patient ID: Kelli [name], female   DOB: 1950/04/23, 62 y.o.   MRN: [id number]  CSW spoke with pt and her sister yesterday and today about d/c planning.  Pt will need transfer tub bench.  CSW has ordered through [employer] and it was delivered today.  Pt will receive outpatient PT/OT at d/c and CSW scheduled this with [facility].  CSW scheduled follow up appointment with Dr. Quintin Alto in [place].  Pt's husband to install handrails for stairs and pt's sister will be transporting her to appointments and provide supervision in the home while husband is working.  CSW answered other questions.

## 2012-10-17 NOTE — Progress Notes (Signed)
Subjective/Complaints: Used ambien last night. Slept well.  A 12 point review of systems has been performed and if not noted above is otherwise negative.  Objective: Vital Signs: Blood pressure 177/68, pulse 60, temperature 98.4 F (36.9 C), temperature source Oral, resp. rate 20, height 5\' 5"  (1.651 m), weight 90.719 kg (200 lb), SpO2 97.00%. No results found. No results found for this basename: WBC, HGB, HCT, PLT,  in the last 72 hours No results found for this basename: NA, K, CL, CO, GLUCOSE, BUN, CREATININE, CALCIUM,  in the last 72 hours CBG (last 3)   Recent Labs  10/16/12 1717 10/16/12 2038 10/17/12 0714  GLUCAP 138* 115* 125*    Wt Readings from Last 3 Encounters:  10/13/12 90.719 kg (200 lb)  10/06/12 89.812 kg (198 lb)  10/06/12 89.812 kg (198 lb)    Physical Exam:  Constitutional: She is oriented to person, place, and time.  HENT:  Head: Normocephalic.  Eyes: EOM are normal.  Neck: Normal range of motion. Neck supple. No thyromegaly present.  Cardiovascular: Normal rate and regular rhythm.  Pulmonary/Chest: Effort normal and breath sounds normal. No respiratory distress. A few upper airway sounds. Abdominal: Soft. Bowel sounds are normal. She exhibits no distension.  Neurological: She is alert, oriented to name, [facility], reason she was here. Follows full commands. Decreased fine motor skills on the right. Delayed motor processing at times Skin: Skin is warm. Left carotid endarterectomy site clean and dry with Steri-Strips in place.  motor strength 5/5 in the right deltoid, bicep, tricep, grip, hip flexor, knee extensors, ankle dorsiflexor plantar flexor  4/5 left deltoid, bicep, tricep, grip, hip flexor, knee extensors, ankle dorsiflexor and plantar flexor  Cerebellar has continued past pointing with bilateral finger nose to finger and persistent left pronator drift. Mild left inattention. Extraocular muscles are intact?--mild asymmetry in gaze?Marland Kitchen Nystagmus with  tracking laterally left/right and superiorly today. Sitting balance is good  Sensation intact to light touch in both upper and lower extremities    Assessment/Plan: 1. Functional deficits secondary to bilateral watershed distribution infarcts which require 3+ hours per day of interdisciplinary therapy in a comprehensive inpatient rehab setting. Physiatrist is providing close team supervision and 24 hour management of active medical problems listed below. Physiatrist and rehab team continue to assess barriers to discharge/monitor patient progress toward functional and medical goals.  Recommended formal visual evaluation 6 weeks or so after dc.    FIM: FIM - Bathing Bathing Steps Patient Completed: Chest;Right Arm;Left Arm;Abdomen;Front perineal area;Buttocks;Right upper leg;Left upper leg;Right lower leg (including foot);Left lower leg (including foot) Bathing: 5: Set-up assist to: Adjust water temp (supervision/verbal cues)  FIM - Upper Body Dressing/Undressing Upper body dressing/undressing steps patient completed: Thread/unthread right bra strap;Thread/unthread left bra strap;Hook/unhook bra;Thread/unthread right sleeve of pullover shirt/dresss;Thread/unthread left sleeve of pullover shirt/dress;Put head through opening of pull over shirt/dress;Pull shirt over trunk Upper body dressing/undressing: 4: Min-Patient completed 75 plus % of tasks (demonstration/verbal cues) FIM - Lower Body Dressing/Undressing Lower body dressing/undressing steps patient completed: Thread/unthread right underwear leg;Thread/unthread left underwear leg;Pull underwear up/down;Thread/unthread right pants leg;Thread/unthread left pants leg;Pull pants up/down;Don/Doff left shoe;Don/Doff right shoe Lower body dressing/undressing: 5: Supervision: Safety issues/verbal cues  FIM - Toileting Toileting steps completed by patient: Adjust clothing prior to toileting;Performs perineal hygiene;Adjust clothing after  toileting Toileting Assistive Devices: Grab bar or rail for support Toileting: 5: Supervision: Safety issues/verbal cues  FIM - Radio producer Devices: Grab bars Toilet Transfers: 5-To toilet/BSC: Supervision (verbal cues/safety issues);5-From toilet/BSC: Supervision (  verbal cues/safety issues)  FIM - Engineer, site Assistive Devices: Arm rests Bed/Chair Transfer: 5: Bed > Chair or W/C: Supervision (verbal cues/safety issues);5: Chair or W/C > Bed: Supervision (verbal cues/safety issues)  FIM - Locomotion: Wheelchair Distance: 50'x2 Locomotion: Wheelchair: 0: Activity did not occur FIM - Locomotion: Ambulation Locomotion: Ambulation Assistive Devices: Other (comment) (none) Ambulation/Gait Assistance: 4: Min assist Locomotion: Ambulation: 5: Travels 150 ft or more with supervision/safety issues  Comprehension Comprehension Mode: Auditory Comprehension: 5-Follows basic conversation/direction: With no assist  Expression Expression Mode: Verbal Expression: 5-Expresses basic needs/ideas: With extra time/assistive device  Social Interaction Social Interaction: 6-Interacts appropriately with others with medication or extra time (anti-anxiety, antidepressant).  Problem Solving Problem Solving: 4-Solves basic 75 - 89% of the time/requires cueing 10 - 24% of the time  Memory Memory: 4-Recognizes or recalls 75 - 89% of the time/requires cueing 10 - 24% of the time Medical Problem List and Plan:  1. 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 DVT Prophylaxis/Anticoagulation: SCDs. The patient is ambulatory  3. Pain Management/history fibromyalgia. Percocet as needed. Tramadol for more moderate pain  4. Mood/anxiety. Klonopin 0.5 mg twice a day as needed. Provide emotional support.   -seems to be doing a little better here. 5. Neuropsych: This patient is capable of making  decisions on her own behalf.  6. Left carotid endarterectomy status post 10/06/2012. Followup with vascular surgery  7. Hypertension. Norvasc 10 mg daily, clonidine 0.1 milligrams 3 times a day, Cardizem CD 360 mg daily. Monitor the increased mobility  8. Hyperlipidemia. Zocor  9. Tobacco abuse. Counseling  10. Hyperglycemia. Hemoglobin A1c 6.1. Check blood sugars a.c. and at bedtime. Diet changed to carb modified.--follow for trend  11. Baseline renal insufficiency? 1.3-1.4--near baseline 12/ AMS--improving, still an issue at night more than any other time, anxiety component   13. Constipation--results yesterday  -work on daily regimen 14. Insomnia--added prn ambien which helped a lot last night    LOS (Days) 8 A FACE TO FACE EVALUATION WAS PERFORMED  Yiselle Babich T 10/17/2012 8:03 AM

## 2012-10-17 NOTE — Progress Notes (Signed)
Occupational Therapy Session Note  Patient Details  Name: Kelli [name] MRN: [id number] Date of Birth: 1951/01/12  Today's Date: 10/17/2012 Time: [id number] Time Calculation (min): 45 min  Short Term Goals: Week 1:  OT Short Term Goal 1 (Week 1): Patient will complete dressing with setup assistance OT Short Term Goal 2 (Week 1): Patient will complete bathing, seated, with supervision for safety OT Short Term Goal 3 (Week 1): Patient will perform ADL using left UE at diminshed level with verbal cues to initiate. OT Short Term Goal 4 (Week 1): Patient will demonstrate improved endurance as evidenced by ability to tolerate 60 minutes of ADL with only 1 rest break OT Short Term Goal 5 (Week 1): Patient will demonstrate ability to perform HEP with supervision.  Skilled Therapeutic Interventions/Progress Updates:    1:1 Continued and completed family education with pt's sister Amy. Focused on safe functional ambulation around room to gather supplies and around unit, safe transfer in and out of bathtub with tub bench, bathing and dressing with providing the proper types of cuing to promote safety and simple problem solving. Pt continues to require questioning cues for orientation of clothing in prep to don; but only occasionally. Discussed d/c planning, f/u therapies, no driving, recommendations for home environment, importance of routine, left UE coordination activities with handout provided, recommendations for supervision with all tasks ADL and IADL.   Therapy Documentation Precautions:  Precautions Precautions: Fall Precaution Comments: poor visuospatial awareness Restrictions Weight Bearing Restrictions: No Pain: No c/o pain in session  See FIM for current functional status  Therapy/Group: Individual Therapy  Willeen Cass [facility] 10/17/2012, 12:02 PM

## 2012-10-17 NOTE — Progress Notes (Signed)
Speech Language Pathology Daily Session Note  Patient Details  Name: Kelli [name] MRN: [id number] Date of Birth: 1950-09-28  Today's Date: 10/17/2012 Time: 1130-1200 Time Calculation (min): 30 min  Short Term Goals: Week 1: SLP Short Term Goal 1 (Week 1): Pt will demonstrate selective attention in a mildly distracting enviornment for 45 minutes with supervision verbal cues for redirection.  SLP Short Term Goal 2 (Week 1): Pt will attend to left field of enviornment/body during functional tasks with Min verbal and question cues.  SLP Short Term Goal 3 (Week 1): Pt will utilize external memory aids to recall new, daily information with supervision question and verbal cues.  SLP Short Term Goal 4 (Week 1): Pt will utilize call bell to express wants/needs with Min vebral cues.  SLP Short Term Goal 5 (Week 1): Pt will demonstrate functional problem solving with basic and familiar tasks with Min verbal and question cues.   Skilled Therapeutic Interventions: Treatment focus on family education. The pt and her sister were educated on pt's current cognitive function and strategies to utilize to increase safety, problem solving, awareness and working memory at home. Also educated on need for 24 hour supervision and specific tasks she will require assistance with at discharge.   Pt's family verbalized understanding of all information and all questions answered.   FIM:  Comprehension Comprehension Mode: Auditory Comprehension: 5-Follows basic conversation/direction: With no assist Expression Expression Mode: Verbal Expression: 5-Expresses basic needs/ideas: With extra time/assistive device Social Interaction Social Interaction: 6-Interacts appropriately with others with medication or extra time (anti-anxiety, antidepressant). Problem Solving Problem Solving: 4-Solves basic 75 - 89% of the time/requires cueing 10 - 24% of the time Memory Memory: 4-Recognizes or recalls 75 - 89% of the time/requires  cueing 10 - 24% of the time  Pain Pain Assessment Pain Assessment: No/denies pain Faces Pain Scale: Hurts a little bit Pain Location: Leg Pain Orientation: Right;Left Pain Descriptors / Indicators: Cramping Pain Intervention(s): RN made aware;Repositioned (Rest and B LE elevation)  Therapy/Group: Individual Therapy  Tamey Wanek, [place] 10/17/2012, 4:05 PM

## 2012-10-17 NOTE — Plan of Care (Signed)
Problem: Food- and Nutrition-Related Knowledge Deficit (NB-1.1) Goal: Nutrition education Formal process to instruct or train a patient/client in a skill or to impart knowledge to help patients/clients voluntarily manage or modify food choices and eating behavior to maintain or improve health.  Outcome: Completed/Met Date Met:  10/17/12 Nutrition Education Note  RD consulted for nutrition education regarding a Heart Healthy diet.   Lipid Panel     Component Value Date/Time    CHOL 211* 10/05/2012 0027    TRIG 302* 10/05/2012 0027    HDL 30* 10/05/2012 0027    CHOLHDL 7.0 10/05/2012 0027    VLDL 60* 10/05/2012 0027    LDLCALC 121* 10/05/2012 0027    RD provided "Heart Healthy Nutrition Therapy" handout from the Academy of [employer]. Reviewed patient's dietary recall. Provided examples on ways to decrease sodium and fat intake in diet. Discouraged intake of processed foods and use of salt shaker. Encouraged fresh fruits and vegetables as well as whole grain sources of carbohydrates to maximize fiber intake. Discussed held with pt and sister.  Also answered pt questions r/t to diabetes management. Teach back method used.  Expect good compliance.  Body mass index is 33.28 kg/(m^2). Pt meets criteria for obese, class I based on current BMI.  Current diet order is CHO Mod Medium, patient is consuming approximately 50% of meals at this time. Labs and medications reviewed. No further nutrition interventions warranted at this time. RD contact information provided. If additional nutrition issues arise, please re-consult RD.  Brynda Greathouse, MS RD LDN Clinical Inpatient Dietitian Pager: [phone] Weekend/After hours pager: [phone]

## 2012-10-18 ENCOUNTER — Encounter (HOSPITAL_COMMUNITY): Payer: 59 | Admitting: Occupational Therapy

## 2012-10-18 ENCOUNTER — Inpatient Hospital Stay (HOSPITAL_COMMUNITY): Payer: 59

## 2012-10-18 ENCOUNTER — Inpatient Hospital Stay (HOSPITAL_COMMUNITY): Payer: 59 | Admitting: Speech Pathology

## 2012-10-18 DIAGNOSIS — I6330 Cerebral infarction due to thrombosis of unspecified cerebral artery: Secondary | ICD-10-CM

## 2012-10-18 DIAGNOSIS — G8110 Spastic hemiplegia affecting unspecified side: Secondary | ICD-10-CM

## 2012-10-18 LAB — URINALYSIS, ROUTINE W REFLEX MICROSCOPIC
Glucose, UA: NEGATIVE mg/dL
Hgb urine dipstick: NEGATIVE
Ketones, ur: NEGATIVE mg/dL
Leukocytes, UA: NEGATIVE
Protein, ur: >300 mg/dL — AB
pH: 5.5 (ref 5.0–8.0)

## 2012-10-18 LAB — GLUCOSE, CAPILLARY
Glucose-Capillary: 114 mg/dL — ABNORMAL HIGH (ref 70–99)
Glucose-Capillary: 121 mg/dL — ABNORMAL HIGH (ref 70–99)
Glucose-Capillary: 112 mg/dL — ABNORMAL HIGH (ref 70–99)

## 2012-10-18 LAB — URINE MICROSCOPIC-ADD ON

## 2012-10-18 MED ORDER — CIPROFLOXACIN HCL 250 MG PO TABS
250.0000 mg | ORAL_TABLET | Freq: Two times a day (BID) | ORAL | Status: DC
Start: 2012-10-18 — End: 2012-10-19
  Administered 2012-10-18 – 2012-10-19 (×2): 250 mg via ORAL
  Filled 2012-10-18 (×4): qty 1

## 2012-10-18 NOTE — Progress Notes (Signed)
Social Work Patient ID: Kelli [name], female   DOB: [birth year], 62 y.o.   MRN: [id number]  CSW met with pt to discuss Team Conference.  Team feels pt is meeting goals and will be ready for d/c, as scheduled, on 10-19-12.  They feel pt's visual perception is improving.  Pt's sister has completed family education with therapists and equipment has been ordered and received.  Pt is comfortable with the plan and feels "blessed" to have survived this stroke and is motivated to make some lifestyle changes.  Pt is very appreciative of rehab team's care.  CSW will assist with any further needs.

## 2012-10-18 NOTE — Progress Notes (Signed)
Patient complained of burning with and difficulty urinating, and associated pain on low back area. She stated she has had these symptoms before when she developed a UTI. Algis Liming, PA was paged and notified, ordered UA and culture, and Cipro 250mg  BID starting tonight. Patient in agreement, RN will continue to monitor.

## 2012-10-18 NOTE — Progress Notes (Signed)
This note has been reviewed and this clinician agrees with information provided.

## 2012-10-18 NOTE — Progress Notes (Signed)
Occupational Therapy Session Note  Patient Details  Name: Kelli [name] MRN: [id number] Date of Birth: 23-Aug-1950  Today's Date: 10/18/2012 Time: [id number]    Short Term Goals: Week 1:  OT Short Term Goal 1 (Week 1): Patient will complete dressing with setup assistance OT Short Term Goal 2 (Week 1): Patient will complete bathing, seated, with supervision for safety OT Short Term Goal 3 (Week 1): Patient will perform ADL using left UE at diminshed level with verbal cues to initiate. OT Short Term Goal 4 (Week 1): Patient will demonstrate improved endurance as evidenced by ability to tolerate 60 minutes of ADL with only 1 rest break OT Short Term Goal 5 (Week 1): Patient will demonstrate ability to perform HEP with supervision.  Skilled Therapeutic Interventions/Progress Updates:   Pt engaged in 1:1 session focused on ADL retraining, simple problem solving during functional tasks, safety awareness.  Pt ambulated throughout session with no assistive device,  completed shower with set-up assist for adjusting water.  Dressed in unsupported sitting EOB with no verbal/tactile cues, increased time needed for bra management.  Pt employed simple strategy (putting a clothing item to the side when confused and progressing on to next task, returning to item of clothing at later time) with no prompting.  Pt stood to groom at sink, reported fatigue when styling hair.  Assist needed for bilateral UE coordination during hair styling. Pt educated on level of assist needed for ADL's at this time (supervision/set-up) and functional progress made since OT evaluation.   Therapy Documentation Precautions:  Precautions Precautions: Fall Precaution Comments: poor visuospatial awareness Restrictions Weight Bearing Restrictions: No  Pain: No complaint of pain.   See FIM for current functional status  Therapy/Group: Individual Therapy  Janese Banks 10/18/2012, 9:52 AM

## 2012-10-18 NOTE — Patient Care Conference (Addendum)
Inpatient RehabilitationTeam Conference and Plan of Care Update Date: 10/17/2012   Time: 2:50 PM   Patient Name: Kelli [name]      Medical Record Number: [id number]  Date of Birth: [birth year] Sex: Female         Room/Bed: 4W18C/4W18C-01 Payor Info: Payor: Theme park manager / Plan: Theme park manager / Product Type: *No Product type* /    Admitting Diagnosis: WATERSHED INFARCTS L CEA  Admit Date/Time:  10/09/2012  4:45 PM Admission Comments: No comment available   Primary Diagnosis:  CVA (cerebral infarction) Principal Problem: CVA (cerebral infarction)  Patient Active Problem List   Diagnosis Date Noted  . CVA (cerebral infarction) 10/10/2012  . Occlusion and stenosis of carotid artery with cerebral infarction 10/05/2012  . Other and unspecified hyperlipidemia 10/05/2012  . Hyperglycemia 10/05/2012  . Renal insufficiency 10/05/2012  . Acute CVA (cerebrovascular accident) 10/04/2012  . Hypertension 10/04/2012  . Tobacco abuse 10/04/2012  . Obesity 10/04/2012    Expected Discharge Date: Expected Discharge Date: 10/19/12  Team Members Present: Physician leading conference: Dr. Alger Simons Social Worker Present: Lennart Pall, LCSW Nurse Present: Other (comment) (Melissa Ramgeet, RN) PT Present: Otis Brace, PT OT Present: Salome Spotted, OT;Hawk Mones Dione Booze, OT SLP Present: Weston Anna, SLP     Current Status/Progress Goal Weekly Team Focus  Medical   continued problems with coordination, vestibular deficits  vestibular rx, egosupport  oculovestibular deficits   Bowel/Bladder   cont of bowel and bladder; needed enema 8/17 to have bowel movement, has scheduled senekot.  remain cont of bowel and bladder, PO bowel meds  monitor for constipation   Swallow/Nutrition/ Hydration             ADL's   supervision with min to mod cuing   Supervision  family education    Mobility   close (S) during all mobility in controlled enviornments, close (S) to min A  for standing functional mobility in busy environments  (I) bed mobility, (S) for dynamic standing balance, transfers bed<>w/c and car, amb in all environments, stair negotiation as well as cognition  Awarenss to environment during standing functional mobility, ambulatory endurance, increased insight regarding impairments   Communication             Safety/Cognition/ Behavioral Observations  Supervision-Min A  Supervision-Min A  family education   Pain   pain improving; last prn pain med administered on 8/14  pain less than 2  assess pain and medicate as needed   Skin   L neck incision, CDI with steri strips  no skin infection or new breakdown  assess skin qshift and prn    Rehab Goals Patient on target to meet rehab goals: Yes Rehab Goals Revised: none *See Care Plan and progress notes for long and short-term goals.  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  Pt has good extended family support and sister will transport pt to outpatient therapies and MD follow up appointments.  CSW ordered transfer tub bench for home and it has been delivered.    Family has attended family education with therapists.  CSW has resources/information to give to pt/family at d/c.   Team Discussion:  Team feels pt will be ready for d/c, as scheduled, on Thursday.  Her visual perception has improved some.  Family continues to be concerned about pt's quality of sleep.  Pt's sister has attended family education with therapists.  Revisions to Treatment Plan:  None   Continued Need for Acute Rehabilitation Level of Care: The patient requires daily medical management by a physician with specialized training in physical medicine and rehabilitation for the following conditions: Daily direction of a multidisciplinary physical rehabilitation program to  ensure safe treatment while eliciting the highest outcome that is of practical value to the patient.: Yes Daily medical management of patient stability for increased activity during participation in an intensive rehabilitation regime.: Yes Daily analysis of laboratory values and/or radiology reports with any subsequent need for medication adjustment of medical intervention for : Neurological problems;Other  Rola Lennon, Silvestre Mesi 10/18/2012, 9:18 AM

## 2012-10-18 NOTE — Discharge Summary (Signed)
  Discharge summary job [id number]

## 2012-10-18 NOTE — Progress Notes (Signed)
Speech Language Pathology Daily Session Note  Patient Details  Name: Kelli [name] MRN: [id number] Date of Birth: [birth year]  Today's Date: 10/18/2012 Time: 0800-840  Time Calculation (min): 40 min  Short Term Goals: Week 1: SLP Short Term Goal 1 (Week 1): Pt will demonstrate selective attention in a mildly distracting enviornment for 45 minutes with supervision verbal cues for redirection.  SLP Short Term Goal 2 (Week 1): Pt will attend to left field of enviornment/body during functional tasks with Min verbal and question cues.  SLP Short Term Goal 3 (Week 1): Pt will utilize external memory aids to recall new, daily information with supervision question and verbal cues.  SLP Short Term Goal 4 (Week 1): Pt will utilize call bell to express wants/needs with Min vebral cues.  SLP Short Term Goal 5 (Week 1): Pt will demonstrate functional problem solving with basic and familiar tasks with Min verbal and question cues.   Skilled Therapeutic Interventions: Treatment focus on pt education and preparing for discharge home. SLP facilitated session by providing supervision question cues for functional problem solving/reasoning in regards to functional situations she will need assistance with at home. Pt also verbalized understanding for need for 24 hour supervision and provided a handout in regards to memory compensatory strategies to utilize at home.  Pt verbalized understanding of all information.    FIM:  Comprehension Comprehension Mode: Auditory Comprehension: 5-Follows basic conversation/direction: With no assist Expression Expression Mode: Verbal Expression: 5-Expresses basic needs/ideas: With extra time/assistive device Social Interaction Social Interaction: 6-Interacts appropriately with others with medication or extra time (anti-anxiety, antidepressant). Problem Solving Problem Solving: 4-Solves basic 75 - 89% of the time/requires cueing 10 - 24% of the time Memory Memory: 5-Recognizes  or recalls 90% of the time/requires cueing < 10% of the time  Pain Pain Assessment Pain Assessment: No/denies pain   Therapy/Group: Individual Therapy  Bronislaus Verdell 10/18/2012, 12:10 PM

## 2012-10-18 NOTE — Discharge Summary (Signed)
Kelli [name], GOSLIN NO.:  [id number]  MEDICAL RECORD NO.:  [id number]  LOCATION:  4W18C                        FACILITY:  [facility]  PHYSICIAN:  Meredith Staggers, M.D.DATE OF BIRTH:  04/06/1950  DATE OF ADMISSION:  10/09/2012 DATE OF DISCHARGE:  10/19/2012                              DISCHARGE SUMMARY   DISCHARGE DIAGNOSES: 1. 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 2. Sequential compression devices for deep vein thrombosis     prophylaxis. 3. Pain management with history of fibromyalgia. 4. Anxiety. 5. Left carotid enterectomy on October 06, 2012. 6. Hypertension. 7. Hyperlipidemia. 8. Tobacco abuse. 9. Hyperglycemia. 10.Chronic renal insufficiency, creatinine 1.4. 11.Constipation. 12.Insomnia.  HISTORY OF PRESENT ILLNESS:  This is a 62 year old right-handed female with history of hypertension, tobacco abuse, independent prior to admission who was admitted to [facility] on October 03, 2012, with right-sided weakness and altered mental status.  Cranial CT scan showed multi focal acute and subacute ischemia throughout the cerebral hemispheres bilaterally as compared to prior scanning from MRI outside hospital on October 03, 2012.  The patient did not receive tPA.  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  Echocardiogram with ejection fraction of 60% without embolus.  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  Vascular Surgery consulted, underwent left carotid surgery on October 06, 2012, per Dr. Donnetta Hutching.  Maintained on aspirin for CVA prophylaxis.  Tolerating a regular diet.  Physical and occupational therapy on going, the patient was admitted for comprehensive rehab program.  PAST MEDICAL HISTORY:  See discharge diagnoses.  SOCIAL HISTORY:  Lives with spouse.  FUNCTIONAL HISTORY:  Prior to admission,  independent.  Functional status upon admission to rehab services was minimal assist to ambulate 30 feet with one-person handheld assistance.  PHYSICAL EXAMINATION:  VITAL SIGNS:  Blood pressure 169/63, pulse 87, temperature 98.4, respirations 18. GENERAL:  This was an alert female.  No acute distress.  She was oriented to person, place, and time.  She was somewhat anxious.  She had some decreased fine motor skills on the right. LUNGS:  Clear to auscultation. CARDIAC:  Regular rate and rhythm. ABDOMEN:  Soft, nontender.  Good bowel sounds.  Carotid surgical site clean and dry.  REHABILITATION HOSPITAL COURSE:  The patient was admitted to inpatient rehab services with therapies initiated on a 3-hour daily basis consisting of physical therapy, occupational therapy, speech therapy, and rehabilitation nursing.  The following issues were addressed during the patient's rehabilitation stay.  Pertaining to [name]'s bilateral watershed CVA felt to be thromboembolic secondary to carotid stenosis. The patient remained on aspirin therapy per Neurology Services.  She had undergone left carotid surgery per Dr. Donnetta Hutching of Vascular Surgery. Surgical site well healed.  Sequential compression devices were in place for DVT prophylaxis.  Blood pressures controlled and monitored on clonidine as well as Cardizem.  She did have a history of tobacco abuse, maintained on Nicoderm patch.  It was discussed at length the need for cessation of smoking and received full counseling.  It was questionable if she would be  compliant with these request.  She did have a history of anxiety, she was using Klonopin at night.  Bouts of constipation resolved with laxative assistance.  She did have some mild hyperglycemia, hemoglobin A1c 6.1.  Her diet was changed to carb modified and monitor of blood sugars with an acceptable range and she did receive counseling in regards to her diet.  The patient received weekly  collaborative interdisciplinary team conferences to discuss estimated length of stay, family teaching, and any barriers to discharge.  The patient was ambulating 500 feet in a controlled and community environment with close supervision.  Bouts of fatigue with endurance continued to improve.  Occupational therapy continued to work with the patient for activities of daily living.  She did need some minimal verbal cues to avoid congested pathways.  She is followed by speech therapy.  The patient was 89% on problem solving, needing 10-24% of the time for cuing.  Full family teaching was completed and plan was to be discharged to home.  DISCHARGE MEDICATIONS: 1. Aspirin 325 mg p.o. daily. 2. Klonopin 0.25 mg p.o. b.i.d. as needed anxiety. 3. Clonidine 0.2 mg p.o. t.i.d. 4. Cardizem CD 360 mg p.o. daily. 5. Pepcid 20 mg p.o. daily. 6. Nicoderm patch, taper as advised. 7. Percocet 1-2 tablets every 4 hours as needed pain, dispense of 60     tablets. 8. Zocor 10 mg p.o. daily. 9. Senokot-S tablets 2 p.o. bedtime, hold for loose stools.  DIET:  Carb modified.  SPECIAL INSTRUCTIONS:  The patient will follow up with Dr. Alger Simons at the outpatient rehab service office on November 14, 2012. Dr. Antony Contras, Neurology Service, 1 month call for appointment.  Dr. Sherren Mocha Early, Vascular Surgery, 2 weeks.  Dr. Consuello Masse, [place], [place] on October 31, 2012.  Special instructions no alcohol, no driving, no smoking.  Ongoing therapies dictated as per rehab services.     Kelli [name], P.A.   ______________________________ Meredith Staggers, M.D.    DA/MEDQ  D:  10/18/2012  T:  10/18/2012  Job:  [id number]  cc:   Pramod P. Leonie Man, MD Consuello Masse, MD

## 2012-10-18 NOTE — Progress Notes (Signed)
Speech Language Pathology Discharge Summary  Patient Details  Name: QUIARA KORZENIEWSKI MRN: [id number] Date of Birth: [birth year]  Today's Date: 10/18/2012  Patient has met 4 of 4 long term goals.  Patient to discharge at overall Supervision;Min level.   Reasons goals not met: N/A  Clinical Impression/Discharge Summary: Pt has made functional gains and has met 4 of 4 LTG's this admission. Currently, pt requires Min A-Supervision cues for problem solving, working memory, attention to left field of environment, attention, organization, emergent awareness and safety awareness with functional and familiar tasks. Pt/family education complete and pt will discharge home with 24 hour supervision from family. Pt would benefit from f/u outpatient SLP services to maximize cognitive function and overall functional independence.   Care Partner:  Caregiver Able to Provide Assistance: Yes  Type of Caregiver Assistance: Cognitive  Recommendation:  24 hour supervision/assistance;Outpatient SLP  Rationale for SLP Follow Up: Maximize cognitive function and independence;Reduce caregiver burden   Equipment: N/A   Reasons for discharge: Discharged from hospital;Treatment goals met   Patient/Family Agrees with Progress Made and Goals Achieved: Yes   See FIM for current functional status  Aleksi Brummet 10/18/2012, 3:07 PM

## 2012-10-18 NOTE — Progress Notes (Addendum)
Occupational Therapy Daily Progress Note and Discharge Summary  Patient Details  Name: Kelli [name] MRN: [id number] Date of Birth: 12-20-1950  Today's Date: 10/18/2012 Time: [id number] Time Calculation (min): 45 min  Skilled Intervention: Therapeutic activities with emphasis on incorporation of memory aids with use of a personal notepad demonstrated and provided, reduction of stroke risk factors, recognition of signs of depression, goal setting post-d/c to include smoking cessation and stress reduction, and review of accomplishments while participating in rehabilitation services.   Patient relates financial stressors, family hx of HTN, DM II, patient's hx of fibromyalgia and smoking as current liabilities and cites her own determination, family, spirituality, and friends as her greatest supports to continue progress and reduce risk.    Patient has met 7 of 7 long term goals due to improved activity tolerance, improved balance, ability to compensate for deficits, improved awareness and improved coordination.  Patient to discharge at overall Supervision level with intermittent need for verbal cues required for mild memory impairment, assist with sequencing, problem-solving, and spatial awareness as well as stand-by assist for functional transfers during bathing.  [name] caregiver is sister, Kelli [name], and is independent  to provide the necessary physical and cognitive assistance at discharge.    Recommendation:  Patient will benefit from ongoing skilled OT services in outpatient setting to continue to advance functional skills in the area of iADL.  Equipment: Tub bench  Reasons for discharge: treatment goals met  Patient/family agrees with progress made and goals achieved: Yes  OT Discharge Precautions/Restrictions  Precautions Precautions: Fall Precaution Comments: decreased visuospatial awareness Restrictions Weight Bearing Restrictions: No  Vital Signs Therapy Vitals Temp: 98.3 F (36.8  C) Temp src: Oral Pulse Rate: 63 Resp: 19 BP: 167/60 mmHg Patient Position, if appropriate: Sitting Oxygen Therapy SpO2: 97 % O2 Device: None (Room air)  Pain Pain Assessment Pain Assessment: No/denies pain  ADL ADL Eating: Supervision/safety Where Assessed-Eating: Chair Grooming: Supervision/safety Where Assessed-Grooming: Standing at sink Upper Body Bathing: Supervision/safety Where Assessed-Upper Body Bathing: Shower Lower Body Bathing: Supervision/safety Where Assessed-Lower Body Bathing: Shower Upper Body Dressing: Supervision/safety Where Assessed-Upper Body Dressing: Edge of bed Lower Body Dressing: Supervision/safety Where Assessed-Lower Body Dressing: Edge of bed Toileting: Supervision/safety Where Assessed-Toileting: Glass blower/designer: Diplomatic Services operational officer Method: Arts development officer: Energy manager: Close supervison Clinical cytogeneticist Method: Librarian, academic: Facilities manager: Close supervision Social research officer, government Method: Radiographer, therapeutic: Transfer tub bench;Grab bars  Vision/Perception  Vision - History Baseline Vision: Wears glasses all the time Patient Visual Report: Blurring of vision Vision - Assessment Eye Alignment: Within Functional Limits Vision Assessment: Vision impaired - to be further tested in functional context Perception Perception: Impaired   Cognition Overall Cognitive Status: Impaired/Different from baseline Arousal/Alertness: Awake/alert Orientation Level: Oriented X4 Attention: Selective Sustained Attention: Appears intact Selective Attention: Impaired Selective Attention Impairment: Verbal complex;Functional complex Memory: Impaired Memory Impairment: Decreased recall of new information Awareness: Impaired Awareness Impairment: Emergent impairment;Anticipatory impairment Problem Solving:  Impaired Problem Solving Impairment: Functional complex Executive Function: Technical brewer: Impaired Sequencing Impairment: Functional complex Organizing: Impaired Organizing Impairment: Functional complex Behaviors:  (anxious) Safety/Judgment: Impaired Comments: Improved cognition overall, benefits from cues for safe completion of activities  Sensation Sensation Light Touch: Appears Intact Proprioception: Appears Intact Additional Comments: Pt reports onset of numbness/tingling in B UE/LE when fatigued. Pt w/ hx of fibromyalgia and borderline DM Coordination Gross Motor Movements are Fluid and Coordinated: Yes Heel Shin Test: Accurate in B LE,  however, decreased speed L L E>R LE  Motor  Motor Motor: Other (comment) Motor - Discharge Observations: Decreased speed and accuracy of movement in L UE and decreased speed of movement in L LE  Mobility  Bed Mobility Bed Mobility: Left Sidelying to Sit;Sit to Sidelying Left Left Sidelying to Sit: 7: Independent Supine to Sit: 4: Min guard Sit to Supine: 4: Min assist Sit to Sidelying Left: 7: Independent Transfers Sit to Stand: 5: Supervision Sit to Stand Details: Verbal cues for precautions/safety Stand to Sit: 5: Supervision Stand to Sit Details (indicate cue type and reason): Verbal cues for precautions/safety   Trunk/Postural Assessment  Postural Control Postural Control: Within Functional Limits   Balance Balance Balance Assessed: Yes Berg Balance Test Sit to Stand: Able to stand without using hands and stabilize independently Standing Unsupported: Able to stand safely 2 minutes Sitting with Back Unsupported but Feet Supported on Floor or Stool: Able to sit safely and securely 2 minutes Stand to Sit: Sits safely with minimal use of hands Transfers: Able to transfer safely, minor use of hands Standing Unsupported with Eyes Closed: Able to stand 10 seconds with supervision Standing Ubsupported with Feet Together:  Able to place feet together independently and stand for 1 minute with supervision From Standing, Reach Forward with Outstretched Arm: Can reach confidently >25 cm (10") From Standing Position, Pick up Object from Floor: Able to pick up shoe, needs supervision From Standing Position, Turn to Look Behind Over each Shoulder: Looks behind one side only/other side shows less weight shift Turn 360 Degrees: Able to turn 360 degrees safely one side only in 4 seconds or less Standing Unsupported, Alternately Place Feet on Step/Stool: Able to complete 4 steps without aid or supervision Standing Unsupported, One Foot in Front: Able to plae foot ahead of the other independently and hold 30 seconds Standing on One Leg: Tries to lift leg/unable to hold 3 seconds but remains standing independently Total Score: 45 Dynamic Sitting Balance Dynamic Sitting - Balance Support: Feet supported Dynamic Sitting - Level of Assistance: 7: Independent Static Standing Balance Static Standing - Balance Support: During functional activity Static Standing - Level of Assistance: 5: Stand by assistance Dynamic Standing Balance Dynamic Standing - Balance Support: During functional activity Dynamic Standing - Level of Assistance: 5: Stand by assistance Dynamic Standing - Balance Activities: Lateral lean/weight shifting;Forward lean/weight shifting;Reaching for objects  Extremity/Trunk Assessment RUE Assessment RUE Assessment: Within Functional Limits LUE Assessment LUE Assessment: Exceptions to WFL LUE AROM (degrees) LUE Overall AROM Comments: [name] AROM is WFL LUE Strength LUE Overall Strength: Within Functional Limits for tasks assessed LUE Overall Strength Comments: Shoulder strength =3/5, elbow and wrist stength =4+/5; Patients LUE coordination and motor planning are poor throughout LUE  See FIM for current functional status  Maryan Puls 10/18/2012, 3:17 PM

## 2012-10-18 NOTE — Progress Notes (Signed)
Subjective/Complaints: Slept well last night. Didn't use ambien. Excited about progress.  A 12 point review of systems has been performed and if not noted above is otherwise negative.  Objective: Vital Signs: Blood pressure 180/66, pulse 61, temperature 97.6 F (36.4 C), temperature source Oral, resp. rate 20, height 5\' 5"  (1.651 m), weight 90.719 kg (200 lb), SpO2 97.00%. No results found. No results found for this basename: WBC, HGB, HCT, PLT,  in the last 72 hours No results found for this basename: NA, K, CL, CO, GLUCOSE, BUN, CREATININE, CALCIUM,  in the last 72 hours CBG (last 3)   Recent Labs  10/17/12 1650 10/17/12 2108 10/18/12 0718  GLUCAP 135* 106* 114*    Wt Readings from Last 3 Encounters:  10/13/12 90.719 kg (200 lb)  10/06/12 89.812 kg (198 lb)  10/06/12 89.812 kg (198 lb)    Physical Exam:  Constitutional: She is oriented to person, place, and time.  HENT:  Head: Normocephalic.  Eyes: EOM are normal.  Neck: Normal range of motion. Neck supple. No thyromegaly present.  Cardiovascular: Normal rate and regular rhythm.  Pulmonary/Chest: Effort normal and breath sounds normal. No respiratory distress. A few upper airway sounds. Abdominal: Soft. Bowel sounds are normal. She exhibits no distension.  Neurological: She is alert, oriented to name, [facility], reason she was here. Follows full commands. Decreased fine motor skills on the right. Delayed motor processing at times Skin: Skin is warm. Left carotid endarterectomy site clean and dry with Steri-Strips in place.  motor strength 5/5 in the right deltoid, bicep, tricep, grip, hip flexor, knee extensors, ankle dorsiflexor plantar flexor  4/5 left deltoid, bicep, tricep, grip, hip flexor, knee extensors, ankle dorsiflexor and plantar flexor  Cerebellar has continued past pointing with bilateral finger nose to finger and persistent left pronator drift. Left inattention has improved. Extraocular muscles are intact?--mild  asymmetry in gaze?Marland Kitchen Nystagmus with tracking laterally left/right and superiorly today. Sitting balance is good  Sensation intact to light touch in both upper and lower extremities    Assessment/Plan: 1. Functional deficits secondary to bilateral watershed distribution infarcts which require 3+ hours per day of interdisciplinary therapy in a comprehensive inpatient rehab setting. Physiatrist is providing close team supervision and 24 hour management of active medical problems listed below. Physiatrist and rehab team continue to assess barriers to discharge/monitor patient progress toward functional and medical goals.  Finalize dc planning for tomorrow.    FIM: FIM - Bathing Bathing Steps Patient Completed: Chest;Right Arm;Left Arm;Abdomen;Front perineal area;Buttocks;Right upper leg;Left upper leg;Right lower leg (including foot);Left lower leg (including foot) Bathing: 5: Set-up assist to: Adjust water temp  FIM - Upper Body Dressing/Undressing Upper body dressing/undressing steps patient completed: Thread/unthread right sleeve of pullover shirt/dresss;Thread/unthread left sleeve of pullover shirt/dress;Put head through opening of pull over shirt/dress;Pull shirt over trunk;Thread/unthread right bra strap;Thread/unthread left bra strap;Hook/unhook bra Upper body dressing/undressing: 5: Supervision: Safety issues/verbal cues FIM - Lower Body Dressing/Undressing Lower body dressing/undressing steps patient completed: Thread/unthread right underwear leg;Thread/unthread left underwear leg;Pull underwear up/down;Thread/unthread right pants leg;Thread/unthread left pants leg;Pull pants up/down;Don/Doff left shoe;Don/Doff right shoe Lower body dressing/undressing: 5: Supervision: Safety issues/verbal cues  FIM - Toileting Toileting steps completed by patient: Adjust clothing prior to toileting;Performs perineal hygiene;Adjust clothing after toileting Toileting Assistive Devices: Grab bar or rail  for support Toileting: 5: Supervision: Safety issues/verbal cues  FIM - Radio producer Devices: Grab bars Toilet Transfers: 5-To toilet/BSC: Supervision (verbal cues/safety issues);5-From toilet/BSC: Supervision (verbal cues/safety issues)  FIM - Bed/Chair  Financial planner Devices: Arm rests Bed/Chair Transfer: 5: Bed > Chair or W/C: Supervision (verbal cues/safety issues);5: Chair or W/C > Bed: Supervision (verbal cues/safety issues)  FIM - Locomotion: Wheelchair Distance: 50'x2 Locomotion: Wheelchair: 0: Activity did not occur FIM - Locomotion: Ambulation Locomotion: Ambulation Assistive Devices: Other (comment) (HHA) Ambulation/Gait Assistance: 4: Min assist Locomotion: Ambulation: 4: Travels 150 ft or more with minimal assistance (Pt.>75%)  Comprehension Comprehension Mode: Auditory Comprehension: 5-Follows basic conversation/direction: With no assist  Expression Expression Mode: Verbal Expression: 5-Expresses basic needs/ideas: With extra time/assistive device  Social Interaction Social Interaction: 6-Interacts appropriately with others with medication or extra time (anti-anxiety, antidepressant).  Problem Solving Problem Solving: 5-Solves basic 90% of the time/requires cueing < 10% of the time  Memory Memory: 5-Recognizes or recalls 90% of the time/requires cueing < 10% of the time Medical Problem List and Plan:  1. 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 DVT Prophylaxis/Anticoagulation: SCDs. The patient is ambulatory  3. Pain Management/history fibromyalgia. Percocet as needed. Tramadol for more moderate pain  4. Mood/anxiety. Klonopin 0.5 mg twice a day as needed. Provide emotional support.   -seems to be doing a little better here. 5. Neuropsych: This patient is capable of making decisions on her own behalf.  6. Left carotid endarterectomy status post  10/06/2012. Followup with vascular surgery  7. Hypertension. Norvasc 10 mg daily, clonidine 0.1 milligrams 3 times a day, Cardizem CD 360 mg daily. Monitor the increased mobility  8. Hyperlipidemia. Zocor  9. Tobacco abuse. Counseling  10. Hyperglycemia. Hemoglobin A1c 6.1.   -sugars near normal  -outpt followup 11. Baseline renal insufficiency? 1.3-1.4--near baseline 12/ AMS--improving, still an issue at night more than any other time, anxiety component   13. Constipation--results yesterday  -work on daily regimen 14. Insomnia--added prn ambien which she can continue at home potentially  -discussed relaxation techniques to help with sleep onset    LOS (Days) 9 A FACE TO FACE EVALUATION WAS PERFORMED  Kelli [name] T 10/18/2012 8:25 AM

## 2012-10-18 NOTE — Progress Notes (Signed)
Physical Therapy Discharge Summary  Patient Details  Name: Kelli [name] MRN: [id number] Date of Birth: [birth year]  Today's Date: 10/18/2012 Time: [id number] Time Calculation (min): 60 min  Patient has met 8 of 9 long term goals due to improved activity tolerance, improved balance, increased strength, improved attention, improved awareness, improved coordination.  Patient to discharge at an ambulatory level w/ Supervision, but req occasional min A for safety during car t/f due to height of car as well as during stair negotiation if rail not present. [name] care partners, sister Amy and son were present during family training sessions and verbalized understanding of cognitive and physical impairments as well as performance of safe functional mobility with pt. They are independent to provide the necessary physical and cognitive assistance at discharge.  Reasons goals not met: Pt unable to safely perform car t/f at level of supervision due to height of car (truck) that pt will d/c in. Pt will req min A x1person for safe car t/f.   Recommendation:  Patient will benefit from ongoing skilled PT services in outpatient setting to continue to advance safe functional mobility, address ongoing impairments in decreased balance, decreased strength, decreased functional endurance, decreased awareness, decreased attention, decreased coordination, and minimize fall risk.  Equipment: No equipment provided  Reasons for discharge: treatment goals met  Patient/family agrees with progress made and goals achieved: Yes  Skilled Therapeutic Interventions 1:1. Focus this tx session on discharge assessment as well as education regarding s/s of CVA and CVA prevention. See objective information below. Berg Balance Test re-performed, pt inially scored 32/56 and was able to score 45/56 today. Pt educated on results. Pt able to amb >500' throughout tx session w/ close (S). Pt also able to (I) bed mob in a standard bed,  negotiate up/down 12 steps w/ use of single rail and close (S) as well as req min A for car t/f due to increased height of car to simulate personal car (truck). Pt requested to review s/s of CVA as well as ways to prevent another CVA at end of tx session. Therapist reviewed CVA education materials in pt's room with pt. Pt sitting EOB at end of tx session reading through education materials, all needs w/in reach. Pt verbalized understanding of continued need to call for (S) to perform standing functional mobility in room.   PT Discharge Precautions/Restrictions Precautions Precautions: Fall Precaution Comments: decreased visuospatial awareness Restrictions Weight Bearing Restrictions: No Pain Pain Assessment Pain Assessment: Faces Faces Pain Scale: Hurts even more Pain Type: Chronic pain Pain Location: Back Pain Descriptors / Indicators: Dull;Aching Pain Intervention(s): Emotional support;Other (Comment) (seated rest) Vision/Perception  Vision - History Baseline Vision: Wears glasses all the time Patient Visual Report: Blurring of vision Perception Perception: Impaired Comments: improved visuospatial skills, but impairments still present such as when attempting to grab hand rail w/ L UE, pt req 2 tries to make contact w/ rail Praxis Praxis: Impaired Praxis Impairment Details: Motor planning Praxis-Other Comments: Improved, but decreased speed  Cognition Overall Cognitive Status: Impaired/Different from baseline Arousal/Alertness: Awake/alert Orientation Level: Oriented X4 Memory: Impaired Memory Impairment: Decreased recall of new information Awareness: Impaired Awareness Impairment: Emergent impairment;Anticipatory impairment Problem Solving: Impaired Problem Solving Impairment: Functional basic;Functional complex Sequencing: Impaired Sequencing Impairment: Functional complex Behaviors: Other (comment) (Anxious) Safety/Judgment: Impaired Comments: Improved cognition overall,  benefits from cues for safe completion of activities Sensation Sensation Light Touch: Appears Intact Proprioception: Appears Intact Additional Comments: Pt reports onset of numbness/tingling in B UE/LE when fatigued. Pt w/ hx of  fibromyalgia and borderline DM Coordination Gross Motor Movements are Fluid and Coordinated: Yes Heel Shin Test: Accurate in B LE, however, decreased speed L L E>R LE Motor  Motor Motor: Other (comment) Motor - Discharge Observations: Decreased speed and accuracy of movement in L UE and decreased speed of movement in L LE  Mobility Bed Mobility Bed Mobility: Left Sidelying to Sit;Sit to Sidelying Left Left Sidelying to Sit: 7: Independent Sit to Sidelying Left: 7: Independent Transfers Sit to Stand: 5: Supervision Sit to Stand Details: Verbal cues for precautions/safety Stand to Sit: 5: Supervision Stand to Sit Details (indicate cue type and reason): Verbal cues for precautions/safety Locomotion  Ambulation Ambulation: Yes Ambulation/Gait Assistance: 5: Supervision Ambulation Distance (Feet): 250 Feet Assistive device: None Ambulation/Gait Assistance Details: Verbal cues for precautions/safety Gait Gait Pattern: Within Functional Limits Stairs / Additional Locomotion Stairs: Yes Stairs Assistance: 5: Supervision Stairs Assistance Details: Verbal cues for precautions/safety Stair Management Technique: One rail Right Number of Stairs: 12 Wheelchair Mobility Wheelchair Mobility: No (Ambulation in pt's primary means of mobility)  Trunk/Postural Assessment  Postural Control Postural Control: Within Functional Limits  Balance Balance Balance Assessed: Yes Standardized Balance Assessment Standardized Balance Assessment: Berg Balance Test Berg Balance Test Sit to Stand: Able to stand without using hands and stabilize independently Standing Unsupported: Able to stand safely 2 minutes Sitting with Back Unsupported but Feet Supported on Floor or Stool:  Able to sit safely and securely 2 minutes Stand to Sit: Sits safely with minimal use of hands Transfers: Able to transfer safely, minor use of hands Standing Unsupported with Eyes Closed: Able to stand 10 seconds with supervision Standing Ubsupported with Feet Together: Able to place feet together independently and stand for 1 minute with supervision From Standing, Reach Forward with Outstretched Arm: Can reach confidently >25 cm (10") From Standing Position, Pick up Object from Floor: Able to pick up shoe, needs supervision From Standing Position, Turn to Look Behind Over each Shoulder: Looks behind one side only/other side shows less weight shift Turn 360 Degrees: Able to turn 360 degrees safely one side only in 4 seconds or less Standing Unsupported, Alternately Place Feet on Step/Stool: Able to complete 4 steps without aid or supervision Standing Unsupported, One Foot in Front: Able to plae foot ahead of the other independently and hold 30 seconds Standing on One Leg: Tries to lift leg/unable to hold 3 seconds but remains standing independently Total Score: 45 Dynamic Sitting Balance Dynamic Sitting - Balance Support: Feet supported Dynamic Sitting - Level of Assistance: 7: Independent Dynamic Standing Balance Dynamic Standing - Balance Support: During functional activity Dynamic Standing - Level of Assistance: 5: Stand by assistance Dynamic Standing - Balance Activities: Lateral lean/weight shifting;Forward lean/weight shifting;Reaching for objects Extremity Assessment      RLE Assessment RLE Assessment: Within Functional Limits Grossly:(4+/5) LLE Assessment LLE Assessment: Within Functional Limits LLE Strength Left Hip Flexion:  (4-/5) Left Knee Flexion: 4/5 Left Knee Extension: 4/5 Left Ankle Dorsiflexion: 4/5 Left Ankle Plantar Flexion: 4/5  See FIM for current functional status  Otis Brace S 10/18/2012, 12:18 PM

## 2012-10-19 LAB — GLUCOSE, CAPILLARY: Glucose-Capillary: 132 mg/dL — ABNORMAL HIGH (ref 70–99)

## 2012-10-19 MED ORDER — ACETAMINOPHEN 325 MG PO TABS: 325.0000 mg | ORAL_TABLET | ORAL | Status: DC | PRN

## 2012-10-19 MED ORDER — ASPIRIN 325 MG PO TBEC: 325.0000 mg | DELAYED_RELEASE_TABLET | Freq: Every day | ORAL | Status: DC

## 2012-10-19 MED ORDER — NICOTINE 21 MG/24HR TD PT24: MEDICATED_PATCH | TRANSDERMAL | Status: DC

## 2012-10-19 MED ORDER — CLONAZEPAM 0.5 MG PO TABS: 0.5000 mg | ORAL_TABLET | Freq: Two times a day (BID) | ORAL | Status: DC | PRN

## 2012-10-19 MED ORDER — CLONIDINE HCL 0.2 MG PO TABS: 0.2000 mg | ORAL_TABLET | Freq: Three times a day (TID) | ORAL | Status: DC

## 2012-10-19 MED ORDER — MECLIZINE HCL 25 MG PO TABS: 25.0000 mg | ORAL_TABLET | Freq: Every day | ORAL | Status: DC | PRN

## 2012-10-19 MED ORDER — OXYCODONE-ACETAMINOPHEN 5-325 MG PO TABS: 1.0000 | ORAL_TABLET | ORAL | Status: DC | PRN

## 2012-10-19 MED ORDER — SIMVASTATIN 10 MG PO TABS: 10.0000 mg | ORAL_TABLET | Freq: Every day | ORAL | Status: DC

## 2012-10-19 MED ORDER — DILTIAZEM HCL ER BEADS 360 MG PO CP24: 360.0000 mg | ORAL_CAPSULE | Freq: Every day | ORAL | Status: DC

## 2012-10-19 NOTE — Progress Notes (Signed)
Social Work  Discharge Note  The overall goal for the admission was met for: Yes  Discharge location: Yes-home with family 24/7  Length of Stay: Yes - 10 days  Discharge activity level: Yes - supervision overall  Home/community participation: Yes  Services provided included: MD, RD, PT, OT, RN, TR, Pharmacy, Neuropsych and SW  Financial Services: Private Insurance: [employer]  Follow-up services arranged: Outpatient: PT/OT at [facility], DME: transfer tub bench via [facility] and Patient/Family has no preference for HH/DME agencies  Comments (or additional information):  Patient/Family verbalized understanding of follow-up arrangements: Yes  Individual responsible for coordination of the follow-up plan: patient  Confirmed correct DME delivered: Trey Sailors 10/19/2012    Tritia Endo, Silvestre Mesi

## 2012-10-19 NOTE — Progress Notes (Addendum)
Subjective/Complaints: Anxious to get home. Happy with progress. Some dysuria last night.   A 12 point review of systems has been performed and if not noted above is otherwise negative.  Objective: Vital Signs: Blood pressure 163/72, pulse 60, temperature 98 F (36.7 C), temperature source Oral, resp. rate 20, height 5\' 5"  (1.651 m), weight 87.7 kg (193 lb 5.5 oz), SpO2 95.00%. No results found. No results found for this basename: WBC, HGB, HCT, PLT,  in the last 72 hours No results found for this basename: NA, K, CL, CO, GLUCOSE, BUN, CREATININE, CALCIUM,  in the last 72 hours CBG (last 3)   Recent Labs  10/18/12 1621 10/18/12 2049 10/19/12 0744  GLUCAP 112* 119* 132*    Wt Readings from Last 3 Encounters:  10/18/12 87.7 kg (193 lb 5.5 oz)  10/06/12 89.812 kg (198 lb)  10/06/12 89.812 kg (198 lb)    Physical Exam:  Constitutional: She is oriented to person, place, and time.  HENT:  Head: Normocephalic.  Eyes: EOM are normal.  Neck: Normal range of motion. Neck supple. No thyromegaly present.  Cardiovascular: Normal rate and regular rhythm.  Pulmonary/Chest: Effort normal and breath sounds normal. No respiratory distress. A few upper airway sounds. Abdominal: Soft. Bowel sounds are normal. She exhibits no distension.  Neurological: She is alert, oriented to name, [facility], reason she was here. Follows full commands. Decreased fine motor skills on the right. Delayed motor processing at times Skin: Skin is warm. Left carotid endarterectomy site clean and dry with Steri-Strips in place.  motor strength 5/5 in the right deltoid, bicep, tricep, grip, hip flexor, knee extensors, ankle dorsiflexor plantar flexor  4/5 left deltoid, bicep, tricep, grip, hip flexor, knee extensors, ankle dorsiflexor and plantar flexor  Cerebellar has continued past pointing with bilateral finger nose to finger and persistent left pronator drift. Left inattention has improved. Extraocular muscles are  intact?--mild asymmetry in gaze?Marland Kitchen Nystagmus with tracking laterally left/right and superiorly today. Sitting balance is good  Sensation intact to light touch in both upper and lower extremities    Assessment/Plan: 1. Functional deficits secondary to bilateral watershed distribution infarcts which require 3+ hours per day of interdisciplinary therapy in a comprehensive inpatient rehab setting. Physiatrist is providing close team supervision and 24 hour management of active medical problems listed below. Physiatrist and rehab team continue to assess barriers to discharge/monitor patient progress toward functional and medical goals.  Finalize dc planning for tomorrow.    FIM: FIM - Bathing Bathing Steps Patient Completed: Chest;Right Arm;Left Arm;Abdomen;Front perineal area;Buttocks;Right upper leg;Left upper leg;Right lower leg (including foot);Left lower leg (including foot) Bathing: 5: Set-up assist to: Adjust water temp  FIM - Upper Body Dressing/Undressing Upper body dressing/undressing steps patient completed: Thread/unthread right sleeve of pullover shirt/dresss;Thread/unthread left sleeve of pullover shirt/dress;Put head through opening of pull over shirt/dress;Pull shirt over trunk;Thread/unthread right bra strap;Thread/unthread left bra strap;Hook/unhook bra Upper body dressing/undressing: 5: Supervision: Safety issues/verbal cues FIM - Lower Body Dressing/Undressing Lower body dressing/undressing steps patient completed: Thread/unthread right underwear leg;Thread/unthread left underwear leg;Pull underwear up/down;Thread/unthread right pants leg;Thread/unthread left pants leg;Pull pants up/down;Don/Doff left shoe;Don/Doff right shoe Lower body dressing/undressing: 5: Supervision: Safety issues/verbal cues  FIM - Toileting Toileting steps completed by patient: Adjust clothing prior to toileting;Performs perineal hygiene;Adjust clothing after toileting Toileting Assistive Devices:  Grab bar or rail for support Toileting: 5: Supervision: Safety issues/verbal cues  FIM - Radio producer Devices: Grab bars Toilet Transfers: 5-To toilet/BSC: Supervision (verbal cues/safety issues);5-From toilet/BSC: Supervision (verbal  cues/safety issues)  FIM - Control and instrumentation engineer Devices: Arm rests Bed/Chair Transfer: 7: Independent: No helper;5: Chair or W/C > Bed: Supervision (verbal cues/safety issues);5: Bed > Chair or W/C: Supervision (verbal cues/safety issues) ((I) bed mob; (S) transfers)  FIM - Locomotion: Wheelchair Distance: 50'x2 Locomotion: Wheelchair: 0: Activity did not occur FIM - Locomotion: Ambulation Locomotion: Ambulation Assistive Devices: Other (comment) (none) Ambulation/Gait Assistance: 5: Supervision Locomotion: Ambulation: 5: Travels 150 ft or more with supervision/safety issues  Comprehension Comprehension Mode: Auditory Comprehension: 5-Follows basic conversation/direction: With no assist  Expression Expression Mode: Verbal Expression: 5-Expresses basic needs/ideas: With extra time/assistive device  Social Interaction Social Interaction: 6-Interacts appropriately with others with medication or extra time (anti-anxiety, antidepressant).  Problem Solving Problem Solving: 5-Solves basic 90% of the time/requires cueing < 10% of the time  Memory Memory: 5-Recognizes or recalls 90% of the time/requires cueing < 10% of the time Medical Problem List and Plan:  1. 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 DVT Prophylaxis/Anticoagulation: SCDs. The patient is ambulatory  3. Pain Management/history fibromyalgia. Percocet as needed. Tramadol for more moderate pain  4. Mood/anxiety. Klonopin 0.5 mg twice a day as needed. Provide emotional support.   -seems to be doing a little better here. 5. Neuropsych: This patient is capable of making  decisions on her own behalf.  6. Left carotid endarterectomy status post 10/06/2012. Followup with vascular surgery  7. Hypertension. Norvasc 10 mg daily, clonidine 0.1 milligrams 3 times a day, Cardizem CD 360 mg daily. Monitor the increased mobility  8. Hyperlipidemia. Zocor  9. Tobacco abuse. Counseling  10. Hyperglycemia. Hemoglobin A1c 6.1.   -sugars near normal  -outpt followup 11. Baseline renal insufficiency? 1.3-1.4--near baseline 12/ AMS--improving, still an issue at night more than any other time, anxiety component   13. Constipation--improved  -daily home regimen 14. Insomnia--added prn ambien which she can continue at home potentially  -discussed relaxation techniques to help with sleep onset 15. Urinary frequency- ua negative, give cipro this morning then stop    LOS (Days) 10 A FACE TO FACE EVALUATION WAS PERFORMED  SWARTZ,ZACHARY T 10/19/2012 8:39 AM

## 2012-10-20 LAB — URINE CULTURE

## 2012-10-23 ENCOUNTER — Ambulatory Visit: Payer: 59 | Admitting: Physical Therapy

## 2012-10-26 ENCOUNTER — Ambulatory Visit: Payer: 59 | Attending: Physical Medicine & Rehabilitation | Admitting: Rehabilitative and Restorative Service Providers"

## 2012-10-26 DIAGNOSIS — R4189 Other symptoms and signs involving cognitive functions and awareness: Secondary | ICD-10-CM | POA: Insufficient documentation

## 2012-10-26 DIAGNOSIS — IMO0001 Reserved for inherently not codable concepts without codable children: Secondary | ICD-10-CM | POA: Insufficient documentation

## 2012-10-26 DIAGNOSIS — R279 Unspecified lack of coordination: Secondary | ICD-10-CM | POA: Insufficient documentation

## 2012-10-26 DIAGNOSIS — R41842 Visuospatial deficit: Secondary | ICD-10-CM | POA: Insufficient documentation

## 2012-10-27 ENCOUNTER — Encounter: Payer: Self-pay | Admitting: Vascular Surgery

## 2012-10-31 ENCOUNTER — Ambulatory Visit (INDEPENDENT_AMBULATORY_CARE_PROVIDER_SITE_OTHER): Payer: Self-pay | Admitting: Vascular Surgery

## 2012-10-31 ENCOUNTER — Encounter: Payer: Self-pay | Admitting: Vascular Surgery

## 2012-10-31 VITALS — BP 201/81 | HR 62 | Resp 16 | Ht 65.0 in | Wt 194.0 lb

## 2012-10-31 DIAGNOSIS — I658 Occlusion and stenosis of other precerebral arteries: Secondary | ICD-10-CM

## 2012-10-31 DIAGNOSIS — Z48812 Encounter for surgical aftercare following surgery on the circulatory system: Secondary | ICD-10-CM

## 2012-10-31 DIAGNOSIS — I6529 Occlusion and stenosis of unspecified carotid artery: Secondary | ICD-10-CM | POA: Insufficient documentation

## 2012-10-31 DIAGNOSIS — I6523 Occlusion and stenosis of bilateral carotid arteries: Secondary | ICD-10-CM

## 2012-10-31 NOTE — Addendum Note (Signed)
Addended by: Dorthula Rue L on: 10/31/2012 03:30 PM   Modules accepted: Orders

## 2012-10-31 NOTE — Progress Notes (Signed)
The patient has today for followup of her pheresis and left carotid endarterectomy and Dacron patch angioplasty on 10/06/2012. She presented with bilateral a watershed strokes. 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 She underwent endarterectomy and had no new neurologic events following this. She did have a stay for rehabilitation the hospital and then discharged home.  She looks quite good today. She is with family. She is not returned her baseline is making progress. She's having difficulty writing and also has some mild memory issues.  Her neck incision looks quite good and she has a soft carotid bruits bilaterally She does have palpable but diminished left radial versus right radial pulse  Impression and plan stable a followup after her left carotid endarterectomy for symptomatic carotid disease. She'll be seen again in 6 months with repeat carotid duplex which she will continue her rehabilitation.

## 2012-11-01 ENCOUNTER — Ambulatory Visit: Payer: 59 | Attending: Physical Medicine & Rehabilitation | Admitting: Occupational Therapy

## 2012-11-01 ENCOUNTER — Ambulatory Visit: Payer: 59 | Admitting: Rehabilitative and Restorative Service Providers"

## 2012-11-01 DIAGNOSIS — R4189 Other symptoms and signs involving cognitive functions and awareness: Secondary | ICD-10-CM | POA: Insufficient documentation

## 2012-11-01 DIAGNOSIS — M6281 Muscle weakness (generalized): Secondary | ICD-10-CM | POA: Insufficient documentation

## 2012-11-01 DIAGNOSIS — R41841 Cognitive communication deficit: Secondary | ICD-10-CM | POA: Insufficient documentation

## 2012-11-01 DIAGNOSIS — Z5189 Encounter for other specified aftercare: Secondary | ICD-10-CM | POA: Insufficient documentation

## 2012-11-01 DIAGNOSIS — R269 Unspecified abnormalities of gait and mobility: Secondary | ICD-10-CM | POA: Insufficient documentation

## 2012-11-01 DIAGNOSIS — R5381 Other malaise: Secondary | ICD-10-CM | POA: Insufficient documentation

## 2012-11-01 DIAGNOSIS — R279 Unspecified lack of coordination: Secondary | ICD-10-CM | POA: Insufficient documentation

## 2012-11-01 DIAGNOSIS — R41842 Visuospatial deficit: Secondary | ICD-10-CM | POA: Insufficient documentation

## 2012-11-02 ENCOUNTER — Ambulatory Visit: Payer: 59 | Admitting: Physical Therapy

## 2012-11-02 ENCOUNTER — Ambulatory Visit: Payer: 59 | Admitting: Occupational Therapy

## 2012-11-07 ENCOUNTER — Ambulatory Visit: Payer: 59 | Admitting: Rehabilitative and Restorative Service Providers"

## 2012-11-07 ENCOUNTER — Encounter: Payer: 59 | Admitting: Occupational Therapy

## 2012-11-09 ENCOUNTER — Ambulatory Visit: Payer: 59 | Admitting: Physical Therapy

## 2012-11-09 ENCOUNTER — Ambulatory Visit: Payer: 59 | Admitting: Occupational Therapy

## 2012-11-10 ENCOUNTER — Telehealth: Payer: Self-pay | Admitting: Nurse Practitioner

## 2012-11-10 ENCOUNTER — Encounter: Payer: 59 | Admitting: Occupational Therapy

## 2012-11-10 ENCOUNTER — Ambulatory Visit: Payer: 59 | Admitting: Physical Therapy

## 2012-11-10 NOTE — Telephone Encounter (Signed)
Call pt about her appt. And the pt acknowledge that she was coming.

## 2012-11-13 ENCOUNTER — Ambulatory Visit: Payer: 59 | Admitting: Occupational Therapy

## 2012-11-13 ENCOUNTER — Ambulatory Visit: Payer: 59 | Admitting: Physical Therapy

## 2012-11-13 ENCOUNTER — Ambulatory Visit (INDEPENDENT_AMBULATORY_CARE_PROVIDER_SITE_OTHER): Payer: 59 | Admitting: Nurse Practitioner

## 2012-11-13 ENCOUNTER — Encounter: Payer: Self-pay | Admitting: Nurse Practitioner

## 2012-11-13 VITALS — BP 189/77 | HR 89 | Temp 98.5°F | Ht 65.5 in | Wt 199.0 lb

## 2012-11-13 DIAGNOSIS — I10 Essential (primary) hypertension: Secondary | ICD-10-CM

## 2012-11-13 DIAGNOSIS — I639 Cerebral infarction, unspecified: Secondary | ICD-10-CM

## 2012-11-13 DIAGNOSIS — I6350 Cerebral infarction due to unspecified occlusion or stenosis of unspecified cerebral artery: Secondary | ICD-10-CM

## 2012-11-13 DIAGNOSIS — E785 Hyperlipidemia, unspecified: Secondary | ICD-10-CM

## 2012-11-13 DIAGNOSIS — I63239 Cerebral infarction due to unspecified occlusion or stenosis of unspecified carotid arteries: Secondary | ICD-10-CM

## 2012-11-13 NOTE — Progress Notes (Signed)
[facility]  PATIENT: Kelli [name] DOB: 1950-07-26   HISTORY FROM: patient, chart REASON FOR VISIT: routine follow up  HISTORY OF PRESENT ILLNESS:  Kelli [name] is a 62 y.o. female, right handed, with a past medical history significant for HTN, smoking, and fibromyalgia, sent from [facility] 10/03/2012 for further evaluation. MRI brain showed bi-cerebral strokes.  On 8/2 she started noticing some unusual fatigue associated with right arm numbness, poor balance, disorientation, and was confused to the point that she was not able to connect things. She also had blurred vision and a sensation of heaviness in both arms. She never had similar symptoms before and decided to go to to [facility] ED the next day where she had a CT brain that showed no acute intracranial abnormality. She was discharged from the ED 8/3.  Her symptoms persisted. She presented to [facility] where an MRI revealed multiple ischemic infarcts in both cerebral hemispheres. She was transferred to [facility] where a CT brain was performed upon arrival to [facility] ED which disclosed multi focal acute/subacute ischemia throughout the cerebral hemispheres bilaterally, similar to recent brain MRI. She denied headache, vertigo, double vision, difficulty swallowing, slurred speech, language or vision impairment. No recent fever, infection, head or neck trauma. She was admitted and underwent left carotid endarterectomy on 10/03/12.  She was discharged to inpatient rehab.  UPDATE 11/13/12 (LL): Kelli [name] comes to office for hospital stroke follow up.  She has been attending vigorous outpatient PT and OT since discharge.  She has made significant gains, not even needing a cane for walking assistance.  She is still working on balance during PT and fine motor skills such as writing with OT.  She has not smoked since the stroke.  She is taking aspirin daily for secondary stroke prevention. Patient denies medication side effects, with no  signs of bleeding or excessive bruising.  Her BP is elevated in office today, 189/77 in the right arm.  Dr. Donnetta Hutching told her BP was significantly higher in the right arm compared to the left.  She states the most bothersome result of the stroke for her has been worsening vision, with loss of some left peripheral vision and much worse acuity.  REVIEW OF SYSTEMS: Full 14 system review of systems performed and notable only for: constitutional: N/A  cardiovascular: N/A respiratory: N/A endocrine: N/A  ear/nose/throat: N/A  Hematology/Lymph: N/A musculoskeletal: N/A skin: N/A genitourinary: N/A Gastrointestinal: constipation allergy/immunology: N/A neurological: N/A sleep: N/A psychiatric: N/A   ALLERGIES: Allergies  Allergen Reactions  . Hydrochlorothiazide Nausea And Vomiting    HOME MEDICATIONS: Outpatient Prescriptions Prior to Visit  Medication Sig Dispense Refill  . acetaminophen (TYLENOL) 325 MG tablet Take 1-2 tablets (325-650 mg total) by mouth every 4 (four) hours as needed.      Marland Kitchen aspirin EC 325 MG EC tablet Take 1 tablet (325 mg total) by mouth daily.  30 tablet  0  . clonazePAM (KLONOPIN) 0.5 MG tablet Take 1 tablet (0.5 mg total) by mouth 2 (two) times daily as needed for anxiety.  60 tablet  0  . cloNIDine (CATAPRES) 0.2 MG tablet Take 1 tablet (0.2 mg total) by mouth 3 (three) times daily.  90 tablet  1  . diltiazem (TAZTIA XT) 360 MG 24 hr capsule Take 1 capsule (360 mg total) by mouth daily.  30 capsule  1  . meclizine (ANTIVERT) 25 MG tablet Take 1 tablet (25 mg total) by mouth daily as needed for dizziness.  [place]  tablet  0  . ranitidine (ZANTAC) 150 MG tablet Take 150 mg by mouth daily as needed for heartburn.      . simvastatin (ZOCOR) 10 MG tablet Take 1 tablet (10 mg total) by mouth daily at 6 PM.  30 tablet  1  . nicotine (NICODERM CQ - DOSED IN MG/24 HOURS) 21 mg/24hr patch 21 mg patch daily x2 weeks then 14 mg patch daily x3 weeks then 7 mg patch daily x3 weeks  and stop  28 patch  0  . oxyCODONE-acetaminophen (PERCOCET/ROXICET) 5-325 MG per tablet Take 1-2 tablets by mouth every 4 (four) hours as needed.  60 tablet  0   No facility-administered medications prior to visit.    PAST MEDICAL HISTORY: Past Medical History  Diagnosis Date  . Hypertension   . Fibromyalgia   . Stroke   . Cerebral infarction     multiple ischemic; both hemispheres/notes 10/03/2012 (10/04/2012)  . High cholesterol   . Borderline diabetes     "I come from a family of diabetics" (10/04/2012)  . Urinary incontinence     "last few days" (10/04/2012)    PAST SURGICAL HISTORY: Past Surgical History  Procedure Laterality Date  . Urethral dilation  1980's  . Combined hysterectomy vaginal w/ mmk / a&p repair  1981  . Vaginal hysterectomy  1981    "partial" (10/04/2012)  . Endarterectomy Left 10/06/2012    Procedure: Carotid Endarterectomy with Finesse patch angioplasty;  Surgeon: [name], MD;  Location: [facility] OR;  Service: Vascular;  Laterality: Left;  . Carotid endarterectomy      LEFT    FAMILY HISTORY: Family History  Problem Relation Age of Onset  . Diabetes Mother   . Diabetes Father   . Hypertension Sister   . Diabetes Brother   . Seizures Son   . Kidney disease Maternal Grandmother   . Hypertension Maternal Grandmother   . Heart disease Maternal Grandfather   . Diabetes Paternal Grandmother   . Heart disease Paternal Grandfather     SOCIAL HISTORY: History   Social History  . Marital Status: Single    Spouse Name: N/A    Number of Children: N/A  . Years of Education: N/A   Occupational History  . Not on file.   Social History Main Topics  . Smoking status: Former Smoker -- 1.00 packs/day for 44 years    Types: Cigarettes    Quit date: 10/03/2012  . Smokeless tobacco: Never Used  . Alcohol Use: No  . Drug Use: No  . Sexual Activity: Yes    Birth Control/ Protection: Surgical   Other Topics Concern  . Not on file   Social History Narrative    . No narrative on file     PHYSICAL EXAM  Filed Vitals:   11/13/12 1519  BP: 189/77  Pulse: 89  Temp: 98.5 F (36.9 C)  TempSrc: Oral  Height: 5' 5.5" (1.664 m)  Weight: 199 lb (90.266 kg)   Body mass index is 32.6 kg/(m^2).  Generalized: In no acute distress, pleasant Caucasian female   Neck: Supple, left carotid bruit, with healing left endarterectomy scar  Cardiac: Regular rate rhythm, soft 1/6 murmur   Pulmonary: Clear to auscultation bilaterally   Musculoskeletal: No deformity   Neurological examination   Mentation: Alert oriented to time, place, history taking, language fluent, and casual conversation  Cranial nerve II-XII: Pupils were equal round reactive to light extraocular movements were full, moderate peripheral vision loss. Mild left lower facial  weakness.  Uvula tongue midline. head turning and shoulder shrug and were normal and symmetric.Tongue protrusion into cheek strength was normal. MOTOR: Motor system exam reveals bilateral (left greater than right) upper extremity drift and weakness in the left grip and intrinsic hand muscles. Mild decreased left lower extremity strength.  SENSORY: normal and symmetric to light touch, pinprick, temperature COORDINATION: finger-nose-finger, heel-to-shin bilaterally, there was no truncal ataxia REFLEXES: Brachioradialis 2/2, biceps 2/2, triceps 2/2, patellar 2/2, Achilles 2/2, plantar responses were flexor bilaterally. GAIT/STATION: Rising up from seated position without assistance, normal stance, without trunk ataxia, moderate stride, good arm swing, smooth turning, able to perform tiptoe, heel walking or tandem without difficulty.    DIAGNOSTIC DATA (LABS, IMAGING, TESTING) - I reviewed patient records, labs, notes, testing and imaging myself where available.  Lab Results  Component Value Date   WBC 8.9 10/10/2012   HGB 9.2* 10/10/2012   HCT 27.5* 10/10/2012   MCV 87.9 10/10/2012   PLT 269 10/10/2012      Component  Value Date/Time   NA 139 10/10/2012 0620   K 3.7 10/10/2012 0620   CL 105 10/10/2012 0620   CO2 23 10/10/2012 0620   GLUCOSE 129* 10/10/2012 0620   BUN 22 10/10/2012 0620   CREATININE 1.46* 10/10/2012 0620   CALCIUM 8.8 10/10/2012 0620   PROT 5.8* 10/10/2012 0620   ALBUMIN 2.4* 10/10/2012 0620   AST 14 10/10/2012 0620   ALT 12 10/10/2012 0620   ALKPHOS 65 10/10/2012 0620   BILITOT 0.4 10/10/2012 0620   GFRNONAA 37* 10/10/2012 0620   GFRAA 43* 10/10/2012 0620   Lab Results  Component Value Date   CHOL 211* 10/05/2012   HDL 30* 10/05/2012   LDLCALC 121* 10/05/2012   TRIG 302* 10/05/2012   CHOLHDL 7.0 10/05/2012   Lab Results  Component Value Date   HGBA1C 6.1* 10/04/2012   No results found for this basename: VITAMINB12   No results found for this basename: TSH   CT of the brain  10/03/2012 1. Multi focal acute/subacute ischemia throughout the cerebral hemispheres bilaterally, similar to recent brain MRI 10/03/2012.  10/01/2012 (at [facility]) Chronic microvascular ischemic changes in the white matter. No acute process by noncontrast CT.  CT angio of the neck 10/05/2012 1. Occluded right internal carotid artery with slight reconstitution at the petrous segment. 2. High-grade, near occlusive proximal left internal carotid artery stenosis. 3. Occluded left subclavian artery. 4. Normal appearance of the right vertebral artery. 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 5. Decreased vessel diameter of the left vertebral artery below the C6 segment, suggesting a focal stenosis.  MRI of the brain (done at [facility]) Bilateral watershed infarcts  MRA of the brain 10/04/2012 Occluded right internal carotid artery. The right anterior and middle cerebral arteries are supplied by the anterior communicating artery. There is probable atherosclerotic disease in the middle cerebral arteries bilaterally. There is a moderate stenosis of the proximal left posterior cerebral artery.  2D  Echocardiogram EF 55-60% with no source of embolus.  Carotid Doppler 10/04/2012 Right: Occluded internal carotid artery. Left: Greater than 80% internal carotid artery stenosis. Right: Vertebral artery flow is antegrade. Left: Vertebral artery retrograde.   ASSESSMENT AND PLAN Ms. AYLIAH FLICK is a 62 y.o. female presenting with unsteadiness, right upper extremity numbness, heaviness bilateral UE, disorientation. Imaging confirms bilateral watershed infarcts. Infarcts felt to be thromboembolic secondary to carotid stenosis - carotid dopplers confirm occlusion of right ICA and >  80% left ICA stenosis. S/p L CEA on 10/03/2012 by Dr. Donnetta Hutching. Doing well post op. On no antithrombotic prior to admission. Now on aspirin 325 mg orally every day for secondary stroke prevention.   Continue aspirin 325 mg orally every day  for secondary stroke prevention and maintain strict control of hypertension with blood pressure goal below 140/90, and lipids with LDL cholesterol goal below 100 mg/dL.  Continue outpatient PT/OT. Carotid disease followed by Dr. Curt Jews, follow up as directed. Followup in 3 months.  Jabari Swoveland NP-C 11/13/2012, 5:09 PM [facility] [address], [place], [place] [address] [phone]

## 2012-11-13 NOTE — Patient Instructions (Addendum)
Continue aspirin 325 mg orally every day  for secondary stroke prevention and maintain strict control of hypertension with blood pressure goal below 130/90, diabetes with hemoglobin A1c goal below 6.5% and lipids with LDL cholesterol goal below 100 mg/dL. Followup in the future with me in 3 months.  Continue rehab.     STROKE/TIA INSTRUCTIONS SMOKING Cigarette smoking nearly doubles your risk of having a stroke & is the single most alterable risk factor  If you smoke or have smoked in the last 12 months, you are advised to quit smoking for your health.  Most of the excess cardiovascular risk related to smoking disappears within a year of stopping.  Ask you doctor about anti-smoking medications  [place] Quit Line: 1-800-QUIT NOW  Free Smoking Cessation Classes ([phone]  CHOLESTEROL Know your levels; limit fat & cholesterol in your diet  Lab Results  Component Value Date   CHOL 211* 10/05/2012   HDL 30* 10/05/2012   LDLCALC 121* 10/05/2012   TRIG 302* 10/05/2012   CHOLHDL 7.0 10/05/2012      Many patients benefit from treatment even if their cholesterol is at goal.  Goal: Total Cholesterol less than 160  Goal:  LDL less than 100  Goal:  HDL greater than 40  Goal:  Triglycerides less than 150  BLOOD PRESSURE American Stroke Association blood pressure target is less that 120/80 mm/Hg  Your discharge blood pressure is:  BP: 189/77 mmHg  Monitor your blood pressure  Limit your salt and alcohol intake  Many individuals will require more than one medication for high blood pressure  DIABETES (A1c is a blood sugar average for last 3 months) Goal A1c is under 7% (A1c is blood sugar average for last 3 months)  Diabetes: No known diagnosis of diabetes    Lab Results  Component Value Date   HGBA1C 6.1* 10/04/2012    Your A1c can be lowered with medications, healthy diet, and exercise.  Check your blood sugar as directed by your physician  Call your physician if you experience unexplained  or low blood sugars.  PHYSICAL ACTIVITY/REHABILITATION Goal is 30 minutes at least 4 days per week    Activity decreases your risk of heart attack and stroke and makes your heart stronger.  It helps control your weight and blood pressure; helps you relax and can improve your mood.  Participate in a regular exercise program.  Talk with your doctor about the best form of exercise for you (dancing, walking, swimming, cycling).  DIET/WEIGHT Goal is to maintain a healthy weight  Your height is:  Height: 5' 5.5" (166.4 cm) Your current weight is: Weight: 199 lb (90.266 kg) Your body Mass Index (BMI) is:  BMI (Calculated): 32.7  Following the type of diet specifically designed for you will help prevent another stroke.  Your goal Body Mass Index (BMI) is 19-24.  Healthy food habits can help reduce 3 risk factors for stroke:  High cholesterol, hypertension, and excess weight.

## 2012-11-14 ENCOUNTER — Ambulatory Visit: Payer: 59 | Admitting: Occupational Therapy

## 2012-11-14 ENCOUNTER — Encounter: Payer: Self-pay | Admitting: Physical Medicine & Rehabilitation

## 2012-11-14 ENCOUNTER — Encounter: Payer: 59 | Attending: Physical Medicine & Rehabilitation | Admitting: Physical Medicine & Rehabilitation

## 2012-11-14 ENCOUNTER — Ambulatory Visit: Payer: 59 | Admitting: Physical Therapy

## 2012-11-14 VITALS — BP 184/92 | HR 82 | Resp 14 | Ht 65.5 in | Wt 196.8 lb

## 2012-11-14 DIAGNOSIS — I6350 Cerebral infarction due to unspecified occlusion or stenosis of unspecified cerebral artery: Secondary | ICD-10-CM

## 2012-11-14 DIAGNOSIS — I639 Cerebral infarction, unspecified: Secondary | ICD-10-CM

## 2012-11-14 DIAGNOSIS — R350 Frequency of micturition: Secondary | ICD-10-CM | POA: Insufficient documentation

## 2012-11-14 DIAGNOSIS — R32 Unspecified urinary incontinence: Secondary | ICD-10-CM | POA: Insufficient documentation

## 2012-11-14 DIAGNOSIS — I10 Essential (primary) hypertension: Secondary | ICD-10-CM | POA: Insufficient documentation

## 2012-11-14 DIAGNOSIS — I69919 Unspecified symptoms and signs involving cognitive functions following unspecified cerebrovascular disease: Secondary | ICD-10-CM | POA: Insufficient documentation

## 2012-11-14 MED ORDER — TOLTERODINE TARTRATE ER 4 MG PO CP24: 4.0000 mg | ORAL_CAPSULE | Freq: Every day | ORAL | Status: DC

## 2012-11-14 NOTE — Progress Notes (Signed)
Subjective:    Patient ID: [name], female    DOB: 02-22-1951, 62 y.o.   MRN: [id number]  HPI  Krisalyn is back regarding regarding her bilateral CVA's. She is over at neurorehab receiving therapy. She is receiving PT and OT, and SLP will start this week.  She still notes some delays in processing in simple, every day activities. She has more difficulties with concentration and processing when she's tired.   Sleep has been an issue due to urinary frequency. Her PCP placed her on detrol for about a week which helped initially.       Pain Inventory Average Pain 2 Pain Right Now 0 My pain is other  In the last 24 hours, has pain interfered with the following? General activity 0 Relation with others 0 Enjoyment of life 0 What TIME of day is your pain at its worst? night Sleep (in general) Poor  Pain is worse with: walking Pain improves with: other Relief from Meds: 2  Mobility walk without assistance ability to climb steps?  yes do you drive?  no Do you have any goals in this area?  yes  Function not employed: date last employed 04/2012 Do you have any goals in this area?  yes  Neuro/Psych bladder control problems weakness dizziness confusion anxiety  Prior Studies Any changes since last visit?  no  Physicians involved in your care Any changes since last visit?  no   Family History  Problem Relation Age of Onset  . Diabetes Mother   . Diabetes Father   . Hypertension Sister   . Diabetes Brother   . Seizures Son   . Kidney disease Maternal Grandmother   . Hypertension Maternal Grandmother   . Heart disease Maternal Grandfather   . Diabetes Paternal Grandmother   . Heart disease Paternal Grandfather    History   Social History  . Marital Status: Single    Spouse Name: N/A    Number of Children: N/A  . Years of Education: N/A   Social History Main Topics  . Smoking status: Former Smoker -- 1.00 packs/day for 44 years    Types: Cigarettes   Quit date: 10/03/2012  . Smokeless tobacco: Never Used  . Alcohol Use: No  . Drug Use: No  . Sexual Activity: Yes    Birth Control/ Protection: Surgical   Other Topics Concern  . None   Social History Narrative  . None   Past Surgical History  Procedure Laterality Date  . Urethral dilation  1980's  . Combined hysterectomy vaginal w/ mmk / a&p repair  1981  . Vaginal hysterectomy  1981    "partial" (10/04/2012)  . Endarterectomy Left 10/06/2012    Procedure: Carotid Endarterectomy with Finesse patch angioplasty;  Surgeon: [name], MD;  Location: [facility] OR;  Service: Vascular;  Laterality: Left;  . Carotid endarterectomy      LEFT   Past Medical History  Diagnosis Date  . Hypertension   . Fibromyalgia   . Stroke   . Cerebral infarction     multiple ischemic; both hemispheres/notes 10/03/2012 (10/04/2012)  . High cholesterol   . Borderline diabetes     "I come from a family of diabetics" (10/04/2012)  . Urinary incontinence     "last few days" (10/04/2012)   BP 184/92  Pulse 82  Resp 14  Ht 5' 5.5" (1.664 m)  Wt 196 lb 12.8 oz (89.268 kg)  BMI 32.24 kg/m2  SpO2 94%     Review of  Systems  Constitutional: Positive for unexpected weight change.  Gastrointestinal: Positive for constipation.  Genitourinary:       Bladder control problems  Neurological: Positive for dizziness and weakness.  Psychiatric/Behavioral: Positive for confusion. The patient is nervous/anxious.        Objective:   Physical Exam  General: Alert and oriented x 3, No apparent distress HEENT: Head is normocephalic, atraumatic, PERRLA, EOMI, sclera anicteric, oral mucosa pink and moist, dentition intact, ext ear canals clear,  Neck: Supple without JVD or lymphadenopathy Heart: Reg rate and rhythm. No murmurs rubs or gallops Chest: CTA bilaterally without wheezes, rales, or rhonchi; no distress Abdomen: Soft, non-tender, non-distended, bowel sounds positive. Extremities: No clubbing, cyanosis, or  edema. Pulses are 2+ Skin: Clean and intact without signs of breakdown Neuro:  Pt is alert and oriented x 3. Needed cues for date. Did well sequencing verbal information. Reasonable insight and awareness. Had difficulties with number sequencing. Could not do serial 7's . Missed sequencing of numbers on. Could sequence simple tasks. Short term memor functional.  Sensory exam is normal. Reflexes are 2+ in all 4's. Fine motor coordination is intact. No tremors. Motor function is grossly 5/5.  Musculoskeletal: Full ROM, No pain with AROM or PROM in the neck, trunk, or extremities. Posture appropriate Psych: Pt's affect is appropriate. Pt is cooperative         Assessment & Plan:  1. Bilateral watershed infarcts 2. Urinary frequency, incontinence  3. HTN  Plan 1. Double void before bed.  2. Fluid rationing. Recent ua negative. 3. Change  detrol to LA form, can take at 6pm 4. Try miralax in place of soft/lax she's using. 5. Begin SLP to work on attention, organization, problem solving, particurarly with numbers 6. Recommended follow up with optometry in the next few weeks to check vision. 7. I will see her back in 6 weeks. 30 minutes of face to face patient care time were spent during this visit. All questions were encouraged and answered.

## 2012-11-14 NOTE — Patient Instructions (Addendum)
WORK ON RATIONING YOUR FLUID INTAKE SO THAT YOU DRINK LITTLE AFTER DINNER  TRY "DOUBLE VOIDING"  WHEN YOU EMPTY YOUR BLADDER BEFORE BED.  YOU ARE NOW TAKING THE LONG ACTING DETROL FOR YOUR BLADDER.  YOU CAN TAKE AROUND 6PM TO 7PM  FOLLOW UP WITH YOUR OPTOMETRIST AT THE END OF THIS MONTH

## 2012-11-20 ENCOUNTER — Ambulatory Visit: Payer: 59 | Admitting: Occupational Therapy

## 2012-11-20 ENCOUNTER — Ambulatory Visit: Payer: 59

## 2012-11-20 ENCOUNTER — Ambulatory Visit: Payer: 59 | Admitting: Rehabilitative and Restorative Service Providers"

## 2012-11-22 ENCOUNTER — Encounter: Payer: 59 | Admitting: Occupational Therapy

## 2012-11-22 ENCOUNTER — Ambulatory Visit: Payer: 59 | Admitting: Physical Therapy

## 2012-11-22 ENCOUNTER — Ambulatory Visit: Payer: 59 | Admitting: Occupational Therapy

## 2012-11-22 ENCOUNTER — Ambulatory Visit: Payer: 59 | Admitting: Rehabilitative and Restorative Service Providers"

## 2012-11-22 ENCOUNTER — Ambulatory Visit: Payer: 59

## 2012-11-28 ENCOUNTER — Ambulatory Visit: Payer: 59 | Admitting: Rehabilitative and Restorative Service Providers"

## 2012-11-28 ENCOUNTER — Ambulatory Visit: Payer: 59 | Admitting: Occupational Therapy

## 2012-11-30 ENCOUNTER — Ambulatory Visit: Payer: 59 | Admitting: Rehabilitative and Restorative Service Providers"

## 2012-11-30 ENCOUNTER — Ambulatory Visit: Payer: 59 | Admitting: *Deleted

## 2012-11-30 ENCOUNTER — Encounter: Payer: 59 | Admitting: Occupational Therapy

## 2012-11-30 ENCOUNTER — Ambulatory Visit: Payer: 59

## 2012-11-30 ENCOUNTER — Ambulatory Visit: Payer: 59 | Attending: Physical Medicine & Rehabilitation

## 2012-11-30 DIAGNOSIS — R41841 Cognitive communication deficit: Secondary | ICD-10-CM | POA: Insufficient documentation

## 2012-11-30 DIAGNOSIS — R279 Unspecified lack of coordination: Secondary | ICD-10-CM | POA: Insufficient documentation

## 2012-11-30 DIAGNOSIS — R4189 Other symptoms and signs involving cognitive functions and awareness: Secondary | ICD-10-CM | POA: Insufficient documentation

## 2012-11-30 DIAGNOSIS — M6281 Muscle weakness (generalized): Secondary | ICD-10-CM | POA: Insufficient documentation

## 2012-11-30 DIAGNOSIS — R41842 Visuospatial deficit: Secondary | ICD-10-CM | POA: Insufficient documentation

## 2012-11-30 DIAGNOSIS — R269 Unspecified abnormalities of gait and mobility: Secondary | ICD-10-CM | POA: Insufficient documentation

## 2012-11-30 DIAGNOSIS — Z5189 Encounter for other specified aftercare: Secondary | ICD-10-CM | POA: Insufficient documentation

## 2012-11-30 DIAGNOSIS — R5381 Other malaise: Secondary | ICD-10-CM | POA: Insufficient documentation

## 2012-12-01 ENCOUNTER — Telehealth: Payer: Self-pay

## 2012-12-01 NOTE — Telephone Encounter (Signed)
Patient called and is concerned about her blood pressure.  She saw her family doctor and they have given her another medication.  She has a machine at home.  She is currently taking taztiaxt 360mg  daily and clonodine 0.2mg  tid and blood pressure is staying around 160.  She is checking her pressure around 930.  Patient is just uneasy with what her family doctor is prescribing.  She is considering changing family doctors.  Patient made an appointment to discuss with Dr Naaman Plummer.

## 2012-12-06 ENCOUNTER — Ambulatory Visit: Payer: 59

## 2012-12-06 ENCOUNTER — Ambulatory Visit: Payer: 59 | Admitting: Occupational Therapy

## 2012-12-06 ENCOUNTER — Ambulatory Visit: Payer: 59 | Admitting: Rehabilitative and Restorative Service Providers"

## 2012-12-08 ENCOUNTER — Encounter: Payer: Self-pay | Admitting: Physical Medicine & Rehabilitation

## 2012-12-08 ENCOUNTER — Ambulatory Visit: Payer: 59 | Admitting: Occupational Therapy

## 2012-12-08 ENCOUNTER — Encounter: Payer: 59 | Attending: Physical Medicine & Rehabilitation | Admitting: Physical Medicine & Rehabilitation

## 2012-12-08 ENCOUNTER — Ambulatory Visit: Payer: 59

## 2012-12-08 ENCOUNTER — Ambulatory Visit: Payer: 59 | Admitting: Physical Therapy

## 2012-12-08 VITALS — BP 181/86 | HR 88 | Resp 14 | Ht 65.0 in | Wt 198.0 lb

## 2012-12-08 DIAGNOSIS — M545 Low back pain: Secondary | ICD-10-CM

## 2012-12-08 DIAGNOSIS — I10 Essential (primary) hypertension: Secondary | ICD-10-CM

## 2012-12-08 DIAGNOSIS — F411 Generalized anxiety disorder: Secondary | ICD-10-CM | POA: Insufficient documentation

## 2012-12-08 DIAGNOSIS — I6350 Cerebral infarction due to unspecified occlusion or stenosis of unspecified cerebral artery: Secondary | ICD-10-CM | POA: Insufficient documentation

## 2012-12-08 DIAGNOSIS — I639 Cerebral infarction, unspecified: Secondary | ICD-10-CM

## 2012-12-08 MED ORDER — CLONAZEPAM 0.5 MG PO TABS: 0.5000 mg | ORAL_TABLET | Freq: Two times a day (BID) | ORAL | Status: DC | PRN

## 2012-12-08 NOTE — Progress Notes (Signed)
Subjective:    Patient ID: Kelli [name], female    DOB: 07-Nov-1950, 62 y.o.   MRN: [id number]  HPI  Kelli [name] is back regarding her CVA. She has had problems with her BP. She was started on lisinopril. Her anxiety has also been a major problem. She feels that things overwhelm her at times. She admits (after I asked her) that her anxiety is probably affecting her BP.  On positive side, she feels that she's making a lot of progress in therapy. She is working on higher level cognition and there is still work that needs to be done. She is also working on ROM, gait, balance. She has had some back tightness with radiation into her thighs for which therapy is addressing. Her PCP asked her to half her lipid agent as there was some concern the problem was coming from the medication.   She went to her PCP and she hasn't been confident that he is in touch with what's going on with her.   Kelli [name] has an appt to see dr. Frederico Hamman next week regarding her vision.  Pain Inventory Average Pain 3 Pain Right Now 1 My pain is other  In the last 24 hours, has pain interfered with the following? General activity 6 Relation with others 0 Enjoyment of life 3 What TIME of day is your pain at its worst? morning Sleep (in general) Fair  Pain is worse with: walking and some activites Pain improves with: rest Relief from Meds: 1  Mobility walk without assistance how many minutes can you walk? 10 ability to climb steps?  yes do you drive?  yes Do you have any goals in this area?  yes  Function retired  Neuro/Psych bladder control problems weakness anxiety  Prior Studies Any changes since last visit?  no  Physicians involved in your care Any changes since last visit?  no   Family History  Problem Relation Age of Onset  . Diabetes Mother   . Diabetes Father   . Hypertension Sister   . Diabetes Brother   . Seizures Son   . Kidney disease Maternal Grandmother   . Hypertension Maternal  Grandmother   . Heart disease Maternal Grandfather   . Diabetes Paternal Grandmother   . Heart disease Paternal Grandfather    History   Social History  . Marital Status: Single    Spouse Name: N/A    Number of Children: N/A  . Years of Education: N/A   Social History Main Topics  . Smoking status: Former Smoker -- 1.00 packs/day for 44 years    Types: Cigarettes    Quit date: 10/03/2012  . Smokeless tobacco: Never Used  . Alcohol Use: No  . Drug Use: No  . Sexual Activity: Yes    Birth Control/ Protection: Surgical   Other Topics Concern  . None   Social History Narrative  . None   Past Surgical History  Procedure Laterality Date  . Urethral dilation  1980's  . Combined hysterectomy vaginal w/ mmk / a&p repair  1981  . Vaginal hysterectomy  1981    "partial" (10/04/2012)  . Endarterectomy Left 10/06/2012    Procedure: Carotid Endarterectomy with Finesse patch angioplasty;  Surgeon: [name], MD;  Location: [facility] OR;  Service: Vascular;  Laterality: Left;  . Carotid endarterectomy      LEFT   Past Medical History  Diagnosis Date  . Hypertension   . Fibromyalgia   . Stroke   . Cerebral infarction  multiple ischemic; both hemispheres/notes 10/03/2012 (10/04/2012)  . High cholesterol   . Borderline diabetes     "I come from a family of diabetics" (10/04/2012)  . Urinary incontinence     "last few days" (10/04/2012)   BP 181/86  Pulse 88  Resp 14  Ht 5\' 5"  (1.651 m)  Wt 198 lb (89.812 kg)  BMI 32.95 kg/m2  SpO2 93%     Review of Systems  Gastrointestinal: Positive for constipation.  Genitourinary:       Bladder control problems  Neurological: Positive for weakness.  Psychiatric/Behavioral: The patient is nervous/anxious.   All other systems reviewed and are negative.       Objective:   Physical Exam General: Alert and oriented x 3, No apparent distress  HEENT: Head is normocephalic, atraumatic, PERRLA, EOMI, sclera anicteric, oral mucosa pink and  moist, dentition intact, ext ear canals clear,  Neck: Supple without JVD or lymphadenopathy  Heart: Reg rate and rhythm. No murmurs rubs or gallops  Chest: CTA bilaterally without wheezes, rales, or rhonchi; no distress  Abdomen: Soft, non-tender, non-distended, bowel sounds positive.  Extremities: No clubbing, cyanosis, or edema. Pulses are 2+  Skin: Clean and intact without signs of breakdown  Neuro: Pt is alert and oriented x 3. Needed cues for date. Did well sequencing verbal information. Reasonable insight and awareness. Had difficulties with number sequencing. Could not do serial 7's . Missed sequencing of numbers on. Could sequence simple tasks. Short term memor functional. Sensory exam is normal. Reflexes are 2+ in all 4's. Fine motor coordination is intact. No tremors. Motor function is grossly 5/5.  Musculoskeletal: could touch her toes. Had pain with extension and facet maneuvers which mimicked her pain complaints. Hamstrings appeared tight.  Psych: Pt's affect is anxious, sometimes tearful but still pleasant and generally appropriate.   Assessment & Plan:   1. Bilateral watershed infarcts  2. Urinary frequency, incontinence  3. HTN  4. Anxiety, worsened by this CVA 5. Low back pain, ?facet arthropathy   Plan  1. Clonazepam for anxiety. Also work on stress relief, distraction 2. ROM therapies with PT. Pilates principles 3. Will consult dr. Valentina Shaggy regarding the patient's anxiety and coping  4. Will use clonidine for breakthrough hypertension.  May need to  increase lisinopril also.  5. Continue with PT and SLP to work on attention, organization, problem solving, particurarly with numbers  6. optho follow up arranged 7. I will see her back in 4 weeks. 30 minutes of face to face patient care time were spent during this visit. All questions were encouraged and answered.

## 2012-12-08 NOTE — Patient Instructions (Signed)
TAKE 1/2 OF YOUR CLONIDINE TABLET DAILY AS NEEDED IF YOUR BP IS GREATER THAN 200/105.  CALL ME NEXT WEEK IF YOUR BP CONTINUES TO BE ELEVATED.   WORK ON RELAXATION AND LEISURE ACTIVITIES!!!!   WE WILL IMPROVE YOUR BLOOD PRESSURE!!!!

## 2012-12-12 ENCOUNTER — Ambulatory Visit: Payer: 59

## 2012-12-12 ENCOUNTER — Ambulatory Visit: Payer: 59 | Admitting: Occupational Therapy

## 2012-12-12 ENCOUNTER — Ambulatory Visit: Payer: 59 | Admitting: Physical Therapy

## 2012-12-14 ENCOUNTER — Ambulatory Visit: Payer: 59

## 2012-12-14 ENCOUNTER — Ambulatory Visit: Payer: 59 | Admitting: Physical Therapy

## 2012-12-14 ENCOUNTER — Ambulatory Visit: Payer: 59 | Admitting: Occupational Therapy

## 2012-12-19 ENCOUNTER — Ambulatory Visit: Payer: 59 | Admitting: Occupational Therapy

## 2012-12-19 ENCOUNTER — Ambulatory Visit: Payer: 59 | Admitting: Physical Therapy

## 2012-12-19 ENCOUNTER — Telehealth: Payer: Self-pay

## 2012-12-19 ENCOUNTER — Ambulatory Visit: Payer: 59 | Admitting: Physical Medicine & Rehabilitation

## 2012-12-19 ENCOUNTER — Ambulatory Visit: Payer: 59

## 2012-12-19 NOTE — Telephone Encounter (Signed)
I would recommend increasing her lotensin to 10mg  BID to start. She can continue with the clonidine prn. Also, she shouldn't let her anxiety get the best of her.

## 2012-12-19 NOTE — Telephone Encounter (Signed)
Patient called to inform you that she is still having problems keeping her blood pressure down. She has been monitoring it. Patient stated that yesterday it was running high all day so she took a extra 1/2 clonidine as discussed with you. Patient states after taking the medication her BP went higher (232/115). She is asking for advise as to what she needs to do now that that method did not work so well.

## 2012-12-19 NOTE — Telephone Encounter (Signed)
Left voicemail to return call to clinic.

## 2012-12-20 NOTE — Telephone Encounter (Signed)
Patient said she is not taking Lotensin did you mean Lisinopril?

## 2012-12-20 NOTE — Telephone Encounter (Signed)
Yes, lisinopril, i'm sorry

## 2012-12-20 NOTE — Telephone Encounter (Signed)
Patient will start taking Lisinopril 10 mg bid.

## 2012-12-20 NOTE — Telephone Encounter (Signed)
Patient returned call. Called patient back to give her Dr. Charm Barges recommendation's.

## 2012-12-21 ENCOUNTER — Ambulatory Visit: Payer: 59 | Admitting: Physical Therapy

## 2012-12-21 ENCOUNTER — Ambulatory Visit: Payer: 59 | Admitting: Occupational Therapy

## 2012-12-21 ENCOUNTER — Ambulatory Visit: Payer: 59

## 2012-12-23 ENCOUNTER — Other Ambulatory Visit: Payer: Self-pay | Admitting: Physical Medicine & Rehabilitation

## 2012-12-26 ENCOUNTER — Ambulatory Visit: Payer: 59 | Admitting: Physical Therapy

## 2012-12-26 ENCOUNTER — Ambulatory Visit: Payer: 59 | Admitting: Occupational Therapy

## 2012-12-26 ENCOUNTER — Ambulatory Visit: Payer: 59

## 2012-12-28 ENCOUNTER — Ambulatory Visit: Payer: 59

## 2012-12-28 ENCOUNTER — Ambulatory Visit: Payer: 59 | Admitting: Occupational Therapy

## 2012-12-28 ENCOUNTER — Ambulatory Visit: Payer: 59 | Admitting: Rehabilitative and Restorative Service Providers"

## 2013-01-02 ENCOUNTER — Ambulatory Visit: Payer: 59 | Admitting: Speech Pathology

## 2013-01-03 ENCOUNTER — Encounter: Payer: 59 | Attending: Physical Medicine & Rehabilitation | Admitting: Physical Medicine & Rehabilitation

## 2013-01-03 ENCOUNTER — Encounter: Payer: Self-pay | Admitting: Physical Medicine & Rehabilitation

## 2013-01-03 VITALS — BP 176/73 | HR 85 | Resp 14 | Ht 65.0 in | Wt 204.4 lb

## 2013-01-03 DIAGNOSIS — F411 Generalized anxiety disorder: Secondary | ICD-10-CM | POA: Insufficient documentation

## 2013-01-03 DIAGNOSIS — M545 Low back pain: Secondary | ICD-10-CM

## 2013-01-03 DIAGNOSIS — I6350 Cerebral infarction due to unspecified occlusion or stenosis of unspecified cerebral artery: Secondary | ICD-10-CM | POA: Insufficient documentation

## 2013-01-03 DIAGNOSIS — R350 Frequency of micturition: Secondary | ICD-10-CM

## 2013-01-03 DIAGNOSIS — I639 Cerebral infarction, unspecified: Secondary | ICD-10-CM

## 2013-01-03 DIAGNOSIS — I10 Essential (primary) hypertension: Secondary | ICD-10-CM

## 2013-01-03 NOTE — Progress Notes (Signed)
Subjective:    Patient ID: Kelli [name], female    DOB: 02-28-1951, 62 y.o.   MRN: [id number]  HPI  Kelli [name] is back regarding her bilateral watershed infarcts. She has completed therapy. She has been at home since then and hasn't been all that active.   Her PCP has been following her blood pressure. Her lisinopril was replaced with cozaar on Monday by her pcp. This seems to be working a little better for her.  Her mood has been a little better although her energy levels are still poor.   She sees optho again later this month in follow up of her cataracts for prospective surgery.   Pain Inventory Average Pain 3 Pain Right Now 3 My pain is aching  In the last 24 hours, has pain interfered with the following? General activity 4 Relation with others 2 Enjoyment of life 5 What TIME of day is your pain at its worst? daytime and evening Sleep (in general) Fair  Pain is worse with: some activites Pain improves with: rest Relief from Meds: no pain med  Mobility walk without assistance  Function retired  Neuro/Psych bladder control problems bowel control problems weakness anxiety  Prior Studies Any changes since last visit?  no  Physicians involved in your care Any changes since last visit?  no   Family History  Problem Relation Age of Onset  . Diabetes Mother   . Diabetes Father   . Hypertension Sister   . Diabetes Brother   . Seizures Son   . Kidney disease Maternal Grandmother   . Hypertension Maternal Grandmother   . Heart disease Maternal Grandfather   . Diabetes Paternal Grandmother   . Heart disease Paternal Grandfather    History   Social History  . Marital Status: Single    Spouse Name: N/A    Number of Children: N/A  . Years of Education: N/A   Social History Main Topics  . Smoking status: Former Smoker -- 1.00 packs/day for 44 years    Types: Cigarettes    Quit date: 10/03/2012  . Smokeless tobacco: Never Used  . Alcohol Use: No  . Drug Use:  No  . Sexual Activity: Yes    Birth Control/ Protection: Surgical   Other Topics Concern  . None   Social History Narrative  . None   Past Surgical History  Procedure Laterality Date  . Urethral dilation  1980's  . Combined hysterectomy vaginal w/ mmk / a&p repair  1981  . Vaginal hysterectomy  1981    "partial" (10/04/2012)  . Endarterectomy Left 10/06/2012    Procedure: Carotid Endarterectomy with Finesse patch angioplasty;  Surgeon: [name], MD;  Location: [facility] OR;  Service: Vascular;  Laterality: Left;  . Carotid endarterectomy      LEFT   Past Medical History  Diagnosis Date  . Hypertension   . Fibromyalgia   . Stroke   . Cerebral infarction     multiple ischemic; both hemispheres/notes 10/03/2012 (10/04/2012)  . High cholesterol   . Borderline diabetes     "I come from a family of diabetics" (10/04/2012)  . Urinary incontinence     "last few days" (10/04/2012)   BP 176/73  Pulse 85  Resp 14  Ht 5\' 5"  (1.651 m)  Wt 204 lb 6.4 oz (92.715 kg)  BMI 34.01 kg/m2  SpO2 94%     Review of Systems  Gastrointestinal:       Bowel Control Problems  Genitourinary:  Bladder control problems  Neurological: Positive for weakness.  Psychiatric/Behavioral: The patient is nervous/anxious.   All other systems reviewed and are negative.       Objective:   Physical Exam  General: Alert and oriented x 3, No apparent distress  HEENT: Head is normocephalic, atraumatic, PERRLA, EOMI, sclera anicteric, oral mucosa pink and moist, dentition intact, ext ear canals clear,  Neck: Supple without JVD or lymphadenopathy  Heart: Reg rate and rhythm. No murmurs rubs or gallops  Chest: CTA bilaterally without wheezes, rales, or rhonchi; no distress  Abdomen: Soft, non-tender, non-distended, bowel sounds positive.  Extremities: No clubbing, cyanosis, or edema. Pulses are 2+  Skin: Clean and intact without signs of breakdown  Neuro: Pt is alert and oriented x 3.  Better insight and  awareness. Balance is good. Short term memor functional. Sensory exam is normal. Reflexes are 2+ in all 4's. Fine motor coordination is intact. No tremors. Motor function is grossly 5/5.  Musculoskeletal: could touch her toes. Had mild pain with extension and facet maneuvers  Hamstrings appeared tight. Posture is flexed forward,head forward Psych: Pt's affect is anxious, but more appropriate.   Assessment & Plan:   1. Bilateral watershed infarcts  2. Urinary frequency, incontinence  3. HTN  4. Anxiety, worsened by this CVA  5. Low back pain, ?facet arthropathy    Plan  1. Clonazepam for anxiety. Explained that this may certainly be contributing to her fatigue levels. Continue to work on stress relief, distraction as well.  2. BP management per Dr. Quintin Alto  3. Discussed the importance of a HEP. She needs to work on goal related behavior and increasing her activity level as a whole.  4. Cataract surgery per optho  5. I will see her back prn. 15 minutes of face to face patient care time were spent during this visit. All questions were encouraged and answered.

## 2013-01-03 NOTE — Patient Instructions (Signed)
CALL ME WITH ANY PROBLEMS OR QUESTIONS (#[phone]).  HAVE A GOOD DAY  

## 2013-01-04 ENCOUNTER — Other Ambulatory Visit: Payer: Self-pay

## 2013-01-04 ENCOUNTER — Encounter: Payer: 59 | Admitting: Speech Pathology

## 2013-01-15 ENCOUNTER — Emergency Department (HOSPITAL_COMMUNITY)
Admission: EM | Admit: 2013-01-15 | Discharge: 2013-01-15 | Disposition: A | Discharge status: Home / Self Care | Payer: 59 | Attending: Emergency Medicine | Admitting: Emergency Medicine

## 2013-01-15 ENCOUNTER — Encounter (HOSPITAL_COMMUNITY): Payer: Self-pay | Admitting: Emergency Medicine

## 2013-01-15 DIAGNOSIS — Z7982 Long term (current) use of aspirin: Secondary | ICD-10-CM | POA: Insufficient documentation

## 2013-01-15 DIAGNOSIS — R011 Cardiac murmur, unspecified: Secondary | ICD-10-CM | POA: Insufficient documentation

## 2013-01-15 DIAGNOSIS — Z79899 Other long term (current) drug therapy: Secondary | ICD-10-CM | POA: Insufficient documentation

## 2013-01-15 DIAGNOSIS — I10 Essential (primary) hypertension: Secondary | ICD-10-CM | POA: Insufficient documentation

## 2013-01-15 DIAGNOSIS — E7800 Pure hypercholesterolemia, unspecified: Secondary | ICD-10-CM | POA: Insufficient documentation

## 2013-01-15 DIAGNOSIS — Z8673 Personal history of transient ischemic attack (TIA), and cerebral infarction without residual deficits: Secondary | ICD-10-CM | POA: Insufficient documentation

## 2013-01-15 DIAGNOSIS — R109 Unspecified abdominal pain: Secondary | ICD-10-CM | POA: Insufficient documentation

## 2013-01-15 DIAGNOSIS — Z87891 Personal history of nicotine dependence: Secondary | ICD-10-CM | POA: Insufficient documentation

## 2013-01-15 LAB — URINE MICROSCOPIC-ADD ON

## 2013-01-15 LAB — BASIC METABOLIC PANEL
CO2: 27 mEq/L (ref 19–32)
Chloride: 99 mEq/L (ref 96–112)
Creatinine, Ser: 2.06 mg/dL — ABNORMAL HIGH (ref 0.50–1.10)
GFR calc Af Amer: 29 mL/min — ABNORMAL LOW (ref >90)
Potassium: 4.2 mEq/L (ref 3.5–5.1)

## 2013-01-15 LAB — URINALYSIS, ROUTINE W REFLEX MICROSCOPIC
Glucose, UA: NEGATIVE mg/dL
Ketones, ur: NEGATIVE mg/dL
Leukocytes, UA: NEGATIVE
Nitrite: NEGATIVE
Specific Gravity, Urine: 1.01 (ref 1.005–1.030)
pH: 5.5 (ref 5.0–8.0)

## 2013-01-15 MED ORDER — LABETALOL HCL 5 MG/ML IV SOLN
20.0000 mg | Freq: Once | INTRAVENOUS | Status: AC
  Administered 2013-01-15: 20 mg via INTRAVENOUS
  Filled 2013-01-15: qty 4

## 2013-01-15 MED ORDER — CLONIDINE HCL 0.2 MG PO TABS: 0.3000 mg | ORAL_TABLET | Freq: Once | ORAL | Status: DC

## 2013-01-15 MED ORDER — CLONIDINE HCL 0.3 MG/24HR TD PTWK: 0.3000 mg | MEDICATED_PATCH | TRANSDERMAL | Status: DC

## 2013-01-15 MED ORDER — CLONIDINE HCL 0.3 MG/24HR TD PTWK
0.3000 mg | MEDICATED_PATCH | TRANSDERMAL | Status: DC
  Administered 2013-01-15: 0.3 mg via TRANSDERMAL

## 2013-01-15 MED ORDER — CLONIDINE HCL 0.3 MG/24HR TD PTWK
MEDICATED_PATCH | TRANSDERMAL | Status: AC
  Filled 2013-01-15: qty 1

## 2013-01-15 NOTE — ED Notes (Signed)
Pharmacy notified needing catapress patch 0.3mg . States will bring down.

## 2013-01-15 NOTE — ED Notes (Signed)
Pharmacy did not bring catapress patch down. AC called to obtain.

## 2013-01-15 NOTE — ED Provider Notes (Addendum)
CSN: [id number]     Arrival date & time 01/15/13  1510 History  This chart was scribed for Lebron Quam, MD by Jenne Campus, ED Scribe. This patient was seen in room APA12/APA12 and the patient's care was started at 3:56 PM.    CC: HTN  The history is provided by the patient. No language interpreter was used.    HPI Comments: Kelli [name] is a 62 y.o. female who presents to the Emergency Department complaining of HTN in the 190s/90s. She states that she had a CVA on August 5th, 2014. Since then she gets routine BP checks every two weeks. Usually her BPs run in the 200s/90s in the morning and improve with her BP medications. She states that her PCP/nurse team have been allowing her BP to run She went into her PCP's office today and saw a different nurse and physician team who sent her to [facility] over the evaluated BP. BP is 193/101 in the ED. She states that she registered and left to come here due to her records being here. She used to be on Losartan 360 mg prior to her CVA and was switched to Klonopin, cozaar and tolterodine. She denies any recent missed doses. She only c/o a mild HA currently. She denies any deficits from her prior CVA. She does not remember the symptoms she was having with the CVA. Family reports mild confusion and they confirm an echocardiogram was performed during her stay in the hospital.  She denies any extremity weakness or numbness.    Past Medical History  Diagnosis Date  . Hypertension   . Fibromyalgia   . Stroke   . Cerebral infarction     multiple ischemic; both hemispheres/notes 10/03/2012 (10/04/2012)  . High cholesterol   . Borderline diabetes     "I come from a family of diabetics" (10/04/2012)  . Urinary incontinence     "last few days" (10/04/2012)   Past Surgical History  Procedure Laterality Date  . Urethral dilation  1980's  . Combined hysterectomy vaginal w/ mmk / a&p repair  1981  . Vaginal hysterectomy  1981    "partial" (10/04/2012)  .  Endarterectomy Left 10/06/2012    Procedure: Carotid Endarterectomy with Finesse patch angioplasty;  Surgeon: [name], MD;  Location: [facility] OR;  Service: Vascular;  Laterality: Left;  . Carotid endarterectomy      LEFT   Family History  Problem Relation Age of Onset  . Diabetes Mother   . Diabetes Father   . Hypertension Sister   . Diabetes Brother   . Seizures Son   . Kidney disease Maternal Grandmother   . Hypertension Maternal Grandmother   . Heart disease Maternal Grandfather   . Diabetes Paternal Grandmother   . Heart disease Paternal Grandfather    History  Substance Use Topics  . Smoking status: Former Smoker -- 1.00 packs/day for 44 years    Types: Cigarettes    Quit date: 10/03/2012  . Smokeless tobacco: Never Used  . Alcohol Use: No   No OB history provided.  Review of Systems  Constitutional: Negative for fever, chills, diaphoresis, appetite change and fatigue.  HENT: Negative for mouth sores, sore throat and trouble swallowing.   Eyes: Negative for visual disturbance.  Respiratory: Negative for cough, chest tightness, shortness of breath and wheezing.   Cardiovascular: Negative for chest pain.  Gastrointestinal: Positive for abdominal pain (suprapubic pain). Negative for nausea, vomiting, diarrhea and abdominal distention.  Endocrine: Negative for polydipsia, polyphagia and  polyuria.  Genitourinary: Negative for dysuria, frequency and hematuria.  Musculoskeletal: Negative for gait problem.  Skin: Negative for color change, pallor and rash.  Neurological: Negative for dizziness, syncope, light-headedness and headaches.  Hematological: Does not bruise/bleed easily.  Psychiatric/Behavioral: Negative for behavioral problems and confusion.    Allergies  Hydrochlorothiazide  Home Medications   Current Outpatient Rx  Name  Route  Sig  Dispense  Refill  . aspirin EC 81 MG tablet   Oral   Take 81 mg by mouth every morning.         . bisacodyl (CORRECTOL) 5  MG EC tablet   Oral   Take 5 mg by mouth at bedtime.         . clonazePAM (KLONOPIN) 0.5 MG tablet   Oral   Take 0.5 mg by mouth 2 (two) times daily as needed for anxiety.         . cloNIDine (CATAPRES) 0.2 MG tablet   Oral   Take 1 tablet (0.2 mg total) by mouth 3 (three) times daily.   90 tablet   1   . diltiazem (TAZTIA XT) 360 MG 24 hr capsule   Oral   Take 1 capsule (360 mg total) by mouth daily.   30 capsule   1   . losartan (COZAAR) 100 MG tablet   Oral   Take 1 tablet by mouth daily.         . ranitidine (ZANTAC) 150 MG tablet   Oral   Take 150 mg by mouth daily as needed for heartburn.         . simvastatin (ZOCOR) 10 MG tablet   Oral   Take 1 tablet (10 mg total) by mouth daily at 6 PM.   30 tablet   1   . tolterodine (DETROL LA) 4 MG 24 hr capsule   Oral   Take 1 capsule (4 mg total) by mouth daily.   30 capsule   3   . BESIVANCE 0.6 % SUSP   Ophthalmic   Apply 1 drop to eye as directed. Per Procedural instructions         . cloNIDine (CATAPRES-TTS-3) 0.3 mg/24hr patch   Transdermal   Place 1 patch (0.3 mg total) onto the skin once a week.   4 patch   12   . DUREZOL 0.05 % EMUL   Ophthalmic   Apply 1 drop to eye as directed. As directed per procedural instructions         . ketorolac (ACULAR) 0.5 % ophthalmic solution   Both Eyes   Place 1 drop into both eyes as directed. As directed per procedural instructions          Triage Vitals: 196/79  Pulse 89  Temp(Src) 98 F (36.7 C) (Oral)  Resp 18  Ht 5\' 5"  (1.651 m)  Wt 198 lb (89.812 kg)  BMI 32.95 kg/m2  SpO2 96%  Physical Exam  Nursing note and vitals reviewed. Constitutional: She is oriented to person, place, and time. She appears well-developed and well-nourished. No distress.  HENT:  Head: Normocephalic.  Eyes: Conjunctivae are normal. Pupils are equal, round, and reactive to light. No scleral icterus.  Neck: Normal range of motion. Neck supple. No thyromegaly  present.  Well-healed scar, no bruit  Cardiovascular: Normal rate and regular rhythm.  Exam reveals no gallop and no friction rub.   Murmur (2/6 murmur) heard. Pulmonary/Chest: Effort normal and breath sounds normal. No respiratory distress. She has no wheezes. She  has no rales.  Abdominal: Soft. Bowel sounds are normal. She exhibits no distension. There is no tenderness. There is no rebound.  Musculoskeletal: Normal range of motion.  Neurological: She is alert and oriented to person, place, and time.  Skin: Skin is warm and dry. No rash noted.  Psychiatric: She has a normal mood and affect. Her behavior is normal.    ED Course  Procedures (including critical care time)  DIAGNOSTIC STUDIES: Oxygen Saturation is 96% on room air, adequate by my interpretation.    COORDINATION OF CARE: 4:15 PM-Discussed treatment plan which includes IV clonidine and checking kidney functions with pt at bedside and pt agreed to plan. Discussed starting pt on clonidine patch.   Labs Review Labs Reviewed  BASIC METABOLIC PANEL - Abnormal; Notable for the following:    Glucose, Bld 137 (*)    BUN 40 (*)    Creatinine, Ser 2.06 (*)    GFR calc non Af Amer 25 (*)    GFR calc Af Amer 29 (*)    All other components within normal limits  URINE CULTURE  URINALYSIS, ROUTINE W REFLEX MICROSCOPIC   Imaging Review No results found.  EKG Interpretation    Date/Time:  Monday January 15 2013 16:33:08 EST Ventricular Rate:  68 PR Interval:  206 QRS Duration: 86 QT Interval:  402 QTC Calculation: 427 R Axis:   14 Text Interpretation:  Normal sinus rhythm Septal infarct (cited on or before 03-Oct-2012) Abnormal ECG T wave inversions inferior and lateral cited on EKG 09-2012            MDM   1. Hypertension    No stroke symptoms now no EKG changes. No kidney function changes. Her pressure is controlled with a single dose of IV labetalol. We will try her on a 24-hour TTS clonidine patch. Her request  and given her a cardiology referral to further discuss her blood pressure. I think she is appropriate for outpatient treatment and discharge tonight without further study.  I personally performed the services described in this documentation, which was scribed in my presence. The recorded information has been reviewed and is accurate.    Lebron Quam, MD 01/15/13 1817  Lebron Quam, MD 01/15/13 2106

## 2013-01-15 NOTE — ED Notes (Signed)
Pt states high blood pressure which doctor has been checking since pt had a stroke and was discharged from hospital Aug 21. BP was 196/99. Doctor was not in today when she went to the office to have it checked so another MD sent her to the ED. Also states that her bladder has dropped and "has been that way for a while". States urinary frequency recently.

## 2013-01-16 ENCOUNTER — Emergency Department (HOSPITAL_COMMUNITY): Payer: 59

## 2013-01-16 ENCOUNTER — Encounter (HOSPITAL_COMMUNITY): Payer: Self-pay | Admitting: Emergency Medicine

## 2013-01-16 ENCOUNTER — Observation Stay (HOSPITAL_COMMUNITY)
Admission: EM | Admit: 2013-01-16 | Discharge: 2013-01-18 | Disposition: A | Discharge status: Home / Self Care | Payer: 59 | Attending: Internal Medicine | Admitting: Internal Medicine

## 2013-01-16 DIAGNOSIS — N189 Chronic kidney disease, unspecified: Secondary | ICD-10-CM | POA: Insufficient documentation

## 2013-01-16 DIAGNOSIS — I160 Hypertensive urgency: Secondary | ICD-10-CM | POA: Diagnosis present

## 2013-01-16 DIAGNOSIS — R51 Headache: Secondary | ICD-10-CM | POA: Insufficient documentation

## 2013-01-16 DIAGNOSIS — R079 Chest pain, unspecified: Secondary | ICD-10-CM | POA: Diagnosis present

## 2013-01-16 DIAGNOSIS — Z8673 Personal history of transient ischemic attack (TIA), and cerebral infarction without residual deficits: Secondary | ICD-10-CM | POA: Insufficient documentation

## 2013-01-16 DIAGNOSIS — I6529 Occlusion and stenosis of unspecified carotid artery: Secondary | ICD-10-CM | POA: Insufficient documentation

## 2013-01-16 DIAGNOSIS — I129 Hypertensive chronic kidney disease with stage 1 through stage 4 chronic kidney disease, or unspecified chronic kidney disease: Principal | ICD-10-CM | POA: Insufficient documentation

## 2013-01-16 DIAGNOSIS — I10 Essential (primary) hypertension: Secondary | ICD-10-CM

## 2013-01-16 DIAGNOSIS — N183 Chronic kidney disease, stage 3 (moderate): Secondary | ICD-10-CM

## 2013-01-16 DIAGNOSIS — F411 Generalized anxiety disorder: Secondary | ICD-10-CM | POA: Insufficient documentation

## 2013-01-16 LAB — URINE CULTURE
Colony Count: NO GROWTH
Culture: NO GROWTH

## 2013-01-16 LAB — COMPREHENSIVE METABOLIC PANEL
Albumin: 3.9 g/dL (ref 3.5–5.2)
BUN: 34 mg/dL — ABNORMAL HIGH (ref 6–23)
Calcium: 9.3 mg/dL (ref 8.4–10.5)
Chloride: 98 mEq/L (ref 96–112)
Creatinine, Ser: 1.9 mg/dL — ABNORMAL HIGH (ref 0.50–1.10)
Total Bilirubin: 0.3 mg/dL (ref 0.3–1.2)
Total Protein: 8.2 g/dL (ref 6.0–8.3)

## 2013-01-16 LAB — CBC WITH DIFFERENTIAL/PLATELET
Basophils Absolute: 0.1 10*3/uL (ref 0.0–0.1)
Basophils Relative: 1 % (ref 0–1)
Eosinophils Absolute: 0.3 10*3/uL (ref 0.0–0.7)
Eosinophils Relative: 3 % (ref 0–5)
HCT: 37.7 % (ref 36.0–46.0)
Hemoglobin: 12.5 g/dL (ref 12.0–15.0)
Lymphs Abs: 3.1 10*3/uL (ref 0.7–4.0)
MCH: 28.5 pg (ref 26.0–34.0)
MCHC: 33.2 g/dL (ref 30.0–36.0)
Monocytes Absolute: 0.9 10*3/uL (ref 0.1–1.0)
Monocytes Relative: 7 % (ref 3–12)
Neutro Abs: 7.4 10*3/uL (ref 1.7–7.7)
RDW: 13.4 % (ref 11.5–15.5)

## 2013-01-16 LAB — TROPONIN I: Troponin I: <0.3 ng/mL (ref <0.30)

## 2013-01-16 MED ORDER — SODIUM CHLORIDE 0.9 % IJ SOLN
3.0000 mL | Freq: Two times a day (BID) | INTRAMUSCULAR | Status: DC
  Administered 2013-01-16 – 2013-01-18 (×4): 3 mL via INTRAVENOUS

## 2013-01-16 MED ORDER — BISACODYL 5 MG PO TBEC
5.0000 mg | DELAYED_RELEASE_TABLET | Freq: Every day | ORAL | Status: DC
  Administered 2013-01-17 (×2): 5 mg via ORAL
  Filled 2013-01-16 (×2): qty 1

## 2013-01-16 MED ORDER — NON FORMULARY
4.0000 mg | Freq: Every day | Status: DC
  Administered 2013-01-16: 4 mg via ORAL

## 2013-01-16 MED ORDER — CLONAZEPAM 0.5 MG PO TABS
0.5000 mg | ORAL_TABLET | Freq: Two times a day (BID) | ORAL | Status: DC | PRN
  Filled 2013-01-16: qty 1

## 2013-01-16 MED ORDER — DILTIAZEM HCL ER BEADS 240 MG PO CP24
360.0000 mg | ORAL_CAPSULE | Freq: Every day | ORAL | Status: DC
  Administered 2013-01-17 – 2013-01-18 (×2): 360 mg via ORAL
  Filled 2013-01-16 (×7): qty 1

## 2013-01-16 MED ORDER — HYDROCODONE-ACETAMINOPHEN 5-325 MG PO TABS
1.0000 | ORAL_TABLET | ORAL | Status: DC | PRN
  Administered 2013-01-17 (×2): 1 via ORAL
  Filled 2013-01-16 (×2): qty 1

## 2013-01-16 MED ORDER — LOSARTAN POTASSIUM 50 MG PO TABS
100.0000 mg | ORAL_TABLET | Freq: Every day | ORAL | Status: DC
  Administered 2013-01-17 – 2013-01-18 (×2): 100 mg via ORAL
  Filled 2013-01-16 (×2): qty 2

## 2013-01-16 MED ORDER — ASPIRIN 81 MG PO CHEW
324.0000 mg | CHEWABLE_TABLET | Freq: Once | ORAL | Status: AC
  Administered 2013-01-16: 324 mg via ORAL
  Filled 2013-01-16: qty 4

## 2013-01-16 MED ORDER — SODIUM CHLORIDE 0.9 % IJ SOLN: 3.0000 mL | INTRAMUSCULAR | Status: DC | PRN

## 2013-01-16 MED ORDER — LABETALOL HCL 5 MG/ML IV SOLN
20.0000 mg | Freq: Once | INTRAVENOUS | Status: AC
  Administered 2013-01-16: 20 mg via INTRAVENOUS
  Filled 2013-01-16: qty 4

## 2013-01-16 MED ORDER — SODIUM CHLORIDE 0.9 % IV SOLN: 250.0000 mL | INTRAVENOUS | Status: DC | PRN

## 2013-01-16 MED ORDER — NITROGLYCERIN 2 % TD OINT
1.0000 [in_us] | TOPICAL_OINTMENT | Freq: Once | TRANSDERMAL | Status: AC
  Administered 2013-01-16: 1 [in_us] via TOPICAL
  Filled 2013-01-16: qty 1

## 2013-01-16 MED ORDER — ENOXAPARIN SODIUM 40 MG/0.4ML ~~LOC~~ SOLN
40.0000 mg | SUBCUTANEOUS | Status: DC
  Administered 2013-01-17 – 2013-01-18 (×2): 40 mg via SUBCUTANEOUS
  Filled 2013-01-16 (×3): qty 0.4

## 2013-01-16 MED ORDER — CLONIDINE HCL 0.3 MG/24HR TD PTWK
0.3000 mg | MEDICATED_PATCH | TRANSDERMAL | Status: DC
  Administered 2013-01-16: 0.3 mg via TRANSDERMAL

## 2013-01-16 MED ORDER — HYDRALAZINE HCL 20 MG/ML IJ SOLN
20.0000 mg | INTRAMUSCULAR | Status: DC | PRN
  Administered 2013-01-18: 20 mg via INTRAVENOUS
  Filled 2013-01-16 (×2): qty 1

## 2013-01-16 MED ORDER — CLONIDINE HCL 0.2 MG PO TABS
0.2000 mg | ORAL_TABLET | Freq: Three times a day (TID) | ORAL | Status: DC
  Administered 2013-01-17 (×2): 0.2 mg via ORAL
  Filled 2013-01-16 (×2): qty 1

## 2013-01-16 MED ORDER — ASPIRIN EC 81 MG PO TBEC
81.0000 mg | DELAYED_RELEASE_TABLET | Freq: Every morning | ORAL | Status: DC
  Administered 2013-01-17 – 2013-01-18 (×2): 81 mg via ORAL
  Filled 2013-01-16 (×4): qty 1

## 2013-01-16 MED ORDER — NITROGLYCERIN 0.4 MG SL SUBL: 0.4000 mg | SUBLINGUAL_TABLET | SUBLINGUAL | Status: DC | PRN

## 2013-01-16 MED ORDER — SIMVASTATIN 20 MG PO TABS
10.0000 mg | ORAL_TABLET | Freq: Every day | ORAL | Status: DC
  Administered 2013-01-17 (×2): 10 mg via ORAL
  Filled 2013-01-16 (×2): qty 1

## 2013-01-16 NOTE — ED Notes (Signed)
States last BP checked at home was 239/128

## 2013-01-16 NOTE — ED Notes (Signed)
Dr. Shanon Brow in to examine patient.

## 2013-01-16 NOTE — ED Notes (Signed)
Attempted to call report nurse to call back.

## 2013-01-16 NOTE — H&P (Signed)
PCP:   Manon Hilding, MD   Chief Complaint:  High bp  HPI: 62 yo female h/o hard to control htn, cva in august with no residual affects, fibromyalgia comes in with multiple readings at home of bp high sbp over 200.  She complies with her home medication regimen denies missing any doses.  She started having a headache and some sscp today so came to ED.  No focal neuro def.  She was given ntg and her cp and headache resolved in ED.  No fevers.  No n/v.  No recent illnesses.  Her bp is much improved now and she feels much better.  Review of Systems:  Positive and negative as per HPI otherwise all other systems are negative  Past Medical History: Past Medical History  Diagnosis Date  . Hypertension   . Fibromyalgia   . Stroke   . Cerebral infarction     multiple ischemic; both hemispheres/notes 10/03/2012 (10/04/2012)  . High cholesterol   . Borderline diabetes     "I come from a family of diabetics" (10/04/2012)  . Urinary incontinence     "last few days" (10/04/2012)   Past Surgical History  Procedure Laterality Date  . Urethral dilation  1980's  . Combined hysterectomy vaginal w/ mmk / a&p repair  1981  . Vaginal hysterectomy  1981    "partial" (10/04/2012)  . Endarterectomy Left 10/06/2012    Procedure: Carotid Endarterectomy with Finesse patch angioplasty;  Surgeon: [name], MD;  Location: [facility] OR;  Service: Vascular;  Laterality: Left;  . Carotid endarterectomy      LEFT    Medications: Prior to Admission medications   Medication Sig Start Date End Date Taking? Authorizing Provider  aspirin EC 81 MG tablet Take 81 mg by mouth every morning.   Yes Historical Provider, MD  bisacodyl (CORRECTOL) 5 MG EC tablet Take 5 mg by mouth at bedtime.   Yes Historical Provider, MD  clonazePAM (KLONOPIN) 0.5 MG tablet Take 0.5 mg by mouth 2 (two) times daily as needed for anxiety.   Yes Historical Provider, MD  cloNIDine (CATAPRES) 0.2 MG tablet Take 1 tablet (0.2 mg total) by mouth 3 (three)  times daily. 10/19/12  Yes Daniel J Angiulli, PA-C  cloNIDine (CATAPRES-TTS-3) 0.3 mg/24hr patch Place 1 patch (0.3 mg total) onto the skin once a week. 01/15/13  Yes Lebron Quam, MD  diltiazem (TAZTIA XT) 360 MG 24 hr capsule Take 1 capsule (360 mg total) by mouth daily. 10/19/12  Yes Daniel J Angiulli, PA-C  DUREZOL 0.05 % EMUL Apply 1 drop to eye as directed. As directed per procedural instructions 01/10/13  Yes Historical Provider, MD  ketorolac (ACULAR) 0.5 % ophthalmic solution Place 1 drop into both eyes as directed. As directed per procedural instructions 01/12/13  Yes Historical Provider, MD  losartan (COZAAR) 100 MG tablet Take 1 tablet by mouth daily. 01/01/13  Yes Historical Provider, MD  ranitidine (ZANTAC) 150 MG tablet Take 150 mg by mouth daily as needed for heartburn.   Yes Historical Provider, MD  simvastatin (ZOCOR) 10 MG tablet Take 1 tablet (10 mg total) by mouth daily at 6 PM. 10/19/12  Yes Lavon Paganini Angiulli, PA-C  tolterodine (DETROL LA) 4 MG 24 hr capsule Take 1 capsule (4 mg total) by mouth daily. 11/14/12  Yes Meredith Staggers, MD  BESIVANCE 0.6 % SUSP Apply 1 drop to eye as directed. Per Procedural instructions 01/10/13   Historical Provider, MD    Allergies:  Allergies  Allergen Reactions  . Hydrochlorothiazide Nausea And Vomiting    Social History:  reports that she quit smoking about 3 months ago. Her smoking use included Cigarettes. She has a 44 pack-year smoking history. She has never used smokeless tobacco. She reports that she does not drink alcohol or use illicit drugs.  Family History: Family History  Problem Relation Age of Onset  . Diabetes Mother   . Diabetes Father   . Hypertension Sister   . Diabetes Brother   . Seizures Son   . Kidney disease Maternal Grandmother   . Hypertension Maternal Grandmother   . Heart disease Maternal Grandfather   . Diabetes Paternal Grandmother   . Heart disease Paternal Grandfather     Physical Exam: Filed  Vitals:   01/16/13 1821 01/16/13 1830 01/16/13 1915 01/16/13 1930  BP: 216/97 207/95 215/86 206/84  Pulse: 108 102 103 103  Temp:      TempSrc:      Resp: 15 15 17 17   Height:      Weight:      SpO2:       General appearance: alert, cooperative and no distress Head: Normocephalic, without obvious abnormality, atraumatic Eyes: negative Nose: Nares normal. Septum midline. Mucosa normal. No drainage or sinus tenderness. Neck: no JVD and supple, symmetrical, trachea midline Lungs: clear to auscultation bilaterally Heart: regular rate and rhythm, S1, S2 normal, no murmur, click, rub or gallop Abdomen: soft, non-tender; bowel sounds normal; no masses,  no organomegaly Extremities: extremities normal, atraumatic, no cyanosis or edema Pulses: 2+ and symmetric Skin: Skin color, texture, turgor normal. No rashes or lesions Neurologic: Grossly normal    Labs on Admission:   Recent Labs  01/15/13 1636 01/16/13 1813  NA 137 139  K 4.2 3.7  CL 99 98  CO2 27 28  GLUCOSE 137* 161*  BUN 40* 34*  CREATININE 2.06* 1.90*  CALCIUM 9.5 9.3    Recent Labs  01/16/13 1813  AST 13  ALT 13  ALKPHOS 100  BILITOT 0.3  PROT 8.2  ALBUMIN 3.9    Recent Labs  01/16/13 1813  WBC 11.7*  NEUTROABS 7.4  HGB 12.5  HCT 37.7  MCV 85.9  PLT 381    Recent Labs  01/16/13 1813  TROPONINI <0.30    Radiological Exams on Admission: Dg Chest 2 View  01/16/2013   CLINICAL DATA:  Chest pain.  EXAM: CHEST  2 VIEW  COMPARISON:  October 01, 2012.  FINDINGS: The heart size and mediastinal contours are within normal limits. Stable chronic bronchitic changes are noted. Mild hyperinflation of the lungs is noted. Both lungs are clear. The visualized skeletal structures are unremarkable.  IMPRESSION: No acute cardiopulmonary abnormality seen.   Electronically Signed   By: Sabino Dick M.D.   On: 01/16/2013 18:48   Ct Head Wo Contrast  01/16/2013   CLINICAL DATA:  Headache and blurred vision  EXAM: CT  HEAD WITHOUT CONTRAST  TECHNIQUE: Contiguous axial images were obtained from the base of the skull through the vertex without intravenous contrast.  COMPARISON:  10/03/2012  FINDINGS: Areas of white matter and cortical hypodensity are reidentified, and some demonstrate interval apparent evolution with development of focal encephalomalacia for example at the left parietal vertex image 25. No acute hemorrhage, infarct, or mass lesion is identified. No midline shift. No skull fracture. Left ethmoid mucous retention cyst or polyp again noted. Orbits are unremarkable.  IMPRESSION: Interval evolution of previously seen multi focal bilateral areas of white  matter and subcortical ischemia. No new acute intracranial finding.   Electronically Signed   By: Conchita Paris M.D.   On: 01/16/2013 19:18    Assessment/Plan  62 yo female with htn urgency  Principal Problem:   Hypertensive urgency- bp improved.  Place in stepdown overnight.  freq neuro cks.  Prn hydralazine order.  Adjust home meds as needed.  Active Problems:   Carotid stenosis   Anxiety state, unspecified   Headache   Chest pain   H/O: CVA (cerebrovascular accident)   CKD (chronic kidney disease)    [place] Straus A 01/16/2013, 8:07 PM

## 2013-01-16 NOTE — ED Notes (Signed)
States BP high off and on all day today, c/o HA

## 2013-01-16 NOTE — ED Provider Notes (Signed)
CSN: [id number]     Arrival date & time 01/16/13  1734 History  This chart was scribed for Orpah Greek, * by Eugenia Mcalpine, ED Scribe. This patient was seen in room APA18/APA18 and the patient's care was started at 5:48 PM.   Chief Complaint  Patient presents with  . Hypertension   (Consider location/radiation/quality/duration/timing/severity/associated sxs/prior Treatment) HPI  HPI Comments: Kelli [name] is a 62 y.o. female who presents to the Emergency Department complaining of high BP. The patient states that she checks her BP several times a day, she was seen last night for a stroke. She was given a patch last night for her BP, she was told to stop the pills that she was once taking. She currently has a HA and blurred vision with chest tightness. She denies SOB. She also complains of pain behind her ears. Pt denies numbness and tingling in extremities. She had a stoke on august 5th, she has no focal weakness to one side as a result of the stroke. Pt is currently in rehab after the stroke.   Past Medical History  Diagnosis Date  . Hypertension   . Fibromyalgia   . Stroke   . Cerebral infarction     multiple ischemic; both hemispheres/notes 10/03/2012 (10/04/2012)  . High cholesterol   . Borderline diabetes     "I come from a family of diabetics" (10/04/2012)  . Urinary incontinence     "last few days" (10/04/2012)   Past Surgical History  Procedure Laterality Date  . Urethral dilation  1980's  . Combined hysterectomy vaginal w/ mmk / a&p repair  1981  . Vaginal hysterectomy  1981    "partial" (10/04/2012)  . Endarterectomy Left 10/06/2012    Procedure: Carotid Endarterectomy with Finesse patch angioplasty;  Surgeon: [name], MD;  Location: [facility] OR;  Service: Vascular;  Laterality: Left;  . Carotid endarterectomy      LEFT   Family History  Problem Relation Age of Onset  . Diabetes Mother   . Diabetes Father   . Hypertension Sister   . Diabetes Brother   . Seizures Son    . Kidney disease Maternal Grandmother   . Hypertension Maternal Grandmother   . Heart disease Maternal Grandfather   . Diabetes Paternal Grandmother   . Heart disease Paternal Grandfather    History  Substance Use Topics  . Smoking status: Former Smoker -- 1.00 packs/day for 44 years    Types: Cigarettes    Quit date: 10/03/2012  . Smokeless tobacco: Never Used  . Alcohol Use: No   OB History   Grav Para Term Preterm Abortions TAB SAB Ect Mult Living                 Review of Systems  Eyes: Positive for visual disturbance.  Respiratory: Positive for chest tightness. Negative for shortness of breath.   Cardiovascular: Negative for chest pain.  Neurological: Positive for headaches. Negative for numbness.  All other systems reviewed and are negative.    Allergies  Hydrochlorothiazide  Home Medications   Current Outpatient Rx  Name  Route  Sig  Dispense  Refill  . aspirin EC 81 MG tablet   Oral   Take 81 mg by mouth every morning.         Marland Kitchen BESIVANCE 0.6 % SUSP   Ophthalmic   Apply 1 drop to eye as directed. Per Procedural instructions         . bisacodyl (CORRECTOL) 5 MG EC  tablet   Oral   Take 5 mg by mouth at bedtime.         . clonazePAM (KLONOPIN) 0.5 MG tablet   Oral   Take 0.5 mg by mouth 2 (two) times daily as needed for anxiety.         . cloNIDine (CATAPRES) 0.2 MG tablet   Oral   Take 1 tablet (0.2 mg total) by mouth 3 (three) times daily.   90 tablet   1   . cloNIDine (CATAPRES-TTS-3) 0.3 mg/24hr patch   Transdermal   Place 1 patch (0.3 mg total) onto the skin once a week.   4 patch   12   . diltiazem (TAZTIA XT) 360 MG 24 hr capsule   Oral   Take 1 capsule (360 mg total) by mouth daily.   30 capsule   1   . DUREZOL 0.05 % EMUL   Ophthalmic   Apply 1 drop to eye as directed. As directed per procedural instructions         . ketorolac (ACULAR) 0.5 % ophthalmic solution   Both Eyes   Place 1 drop into both eyes as directed.  As directed per procedural instructions         . losartan (COZAAR) 100 MG tablet   Oral   Take 1 tablet by mouth daily.         . ranitidine (ZANTAC) 150 MG tablet   Oral   Take 150 mg by mouth daily as needed for heartburn.         . simvastatin (ZOCOR) 10 MG tablet   Oral   Take 1 tablet (10 mg total) by mouth daily at 6 PM.   30 tablet   1   . tolterodine (DETROL LA) 4 MG 24 hr capsule   Oral   Take 1 capsule (4 mg total) by mouth daily.   30 capsule   3    BP 232/97  Pulse 118  Temp(Src) 98.8 F (37.1 C) (Oral)  Resp 18  Ht 5\' 5"  (1.651 m)  Wt 198 lb (89.812 kg)  BMI 32.95 kg/m2  SpO2 98% Physical Exam  Nursing note and vitals reviewed. Constitutional: She is oriented to person, place, and time. She appears well-developed and well-nourished. No distress.  HENT:  Head: Normocephalic and atraumatic.  Right Ear: Hearing normal.  Left Ear: Hearing normal.  Nose: Nose normal.  Mouth/Throat: Oropharynx is clear and moist and mucous membranes are normal.  Eyes: Conjunctivae and EOM are normal. Pupils are equal, round, and reactive to light.  Neck: Normal range of motion. Neck supple.  Cardiovascular: Regular rhythm, S1 normal and S2 normal.  Exam reveals no gallop and no friction rub.   No murmur heard. Pulmonary/Chest: Effort normal and breath sounds normal. No respiratory distress. She exhibits no tenderness.  Abdominal: Soft. Normal appearance and bowel sounds are normal. There is no hepatosplenomegaly. There is no tenderness. There is no rebound, no guarding, no tenderness at McBurney's point and negative Murphy's sign. No hernia.  Musculoskeletal: Normal range of motion.  Neurological: She is alert and oriented to person, place, and time. She has normal strength. No cranial nerve deficit or sensory deficit. Coordination normal. GCS eye subscore is 4. GCS verbal subscore is 5. GCS motor subscore is 6.  Skin: Skin is warm, dry and intact. No rash noted. No  cyanosis.  Psychiatric: She has a normal mood and affect. Her speech is normal and behavior is normal. Thought content normal.  ED Course  Procedures (including critical care time) DIAGNOSTIC STUDIES: Oxygen Saturation is 98% on RA, normal by my interpretation.    COORDINATION OF CARE:    5:50 PM- Pt advised of plan for treatment checking her labs, decreasing her BP and possibly admitting the pt today and pt agrees.  Labs Review Labs Reviewed  CBC WITH DIFFERENTIAL - Abnormal; Notable for the following:    WBC 11.7 (*)    All other components within normal limits  COMPREHENSIVE METABOLIC PANEL - Abnormal; Notable for the following:    Glucose, Bld 161 (*)    BUN 34 (*)    Creatinine, Ser 1.90 (*)    GFR calc non Af Amer 27 (*)    GFR calc Af Amer 32 (*)    All other components within normal limits  TROPONIN I   Imaging Review Dg Chest 2 View  01/16/2013   CLINICAL DATA:  Chest pain.  EXAM: CHEST  2 VIEW  COMPARISON:  October 01, 2012.  FINDINGS: The heart size and mediastinal contours are within normal limits. Stable chronic bronchitic changes are noted. Mild hyperinflation of the lungs is noted. Both lungs are clear. The visualized skeletal structures are unremarkable.  IMPRESSION: No acute cardiopulmonary abnormality seen.   Electronically Signed   By: Sabino Dick M.D.   On: 01/16/2013 18:48   Ct Head Wo Contrast  01/16/2013   CLINICAL DATA:  Headache and blurred vision  EXAM: CT HEAD WITHOUT CONTRAST  TECHNIQUE: Contiguous axial images were obtained from the base of the skull through the vertex without intravenous contrast.  COMPARISON:  10/03/2012  FINDINGS: Areas of white matter and cortical hypodensity are reidentified, and some demonstrate interval apparent evolution with development of focal encephalomalacia for example at the left parietal vertex image 25. No acute hemorrhage, infarct, or mass lesion is identified. No midline shift. No skull fracture. Left ethmoid mucous  retention cyst or polyp again noted. Orbits are unremarkable.  IMPRESSION: Interval evolution of previously seen multi focal bilateral areas of white matter and subcortical ischemia. No new acute intracranial finding.   Electronically Signed   By: Conchita Paris M.D.   On: 01/16/2013 19:18    EKG Interpretation    Date/Time:  Tuesday January 16 2013 17:58:33 EST Ventricular Rate:  108 PR Interval:  184 QRS Duration: 80 QT Interval:  340 QTC Calculation: 455 R Axis:   20 Text Interpretation:  Sinus tachycardia Septal infarct (cited on or before 03-Oct-2012) Abnormal ECG When compared with ECG of 15-Jan-2013 16:33, Vent. rate has increased BY  40 BPM T wave inversion no longer evident in Inferior leads T wave inversion no longer evident in Lateral leads Confirmed by POLLINA  MD, CHRISTOPHER ([id number]) on 01/16/2013 6:34:03 PM            MDM  No diagnosis found.  She returns for the second day in a row for elevated blood pressure. Patient seen yesterday for elevated blood pressure, treated with IV labetalol and had her medications readjusted. Patient reports that she is experiencing headache again today, has checked her blood pressure multiple times and has stayed very high. She is endorsing tightness and discomfort in the chest associated with the symptoms, no shortness of breath. Patient concerned because she had a recent stroke. She does not have any neurologic findings on exam today. She denies any residual findings from her stroke.  Patient is experiencing some chest pain today. EKG shows no ST changes. Compared to yesterday, she had inferior and  lateral T-wave inversions on her tracing yesterday, these are no longer apparent today. Troponin negative. Patient was administered sublingual nitroglycerin and nitro paste. Her blood pressure is marginally improved, but her headache is resolved her chest pain has resolved. She will be given labetalol.  Due to the refractoriness of her blood  pressure, she will require hospitalization. She'll require serial enzymes because her chest pain. Case discussed with Doctor Shanon Brow, will admit to SDU.    I personally performed the services described in this documentation, which was scribed in my presence. The recorded information has been reviewed and is accurate.    Orpah Greek, MD 01/16/13 2005

## 2013-01-17 ENCOUNTER — Inpatient Hospital Stay (HOSPITAL_COMMUNITY): Payer: 59

## 2013-01-17 LAB — BASIC METABOLIC PANEL
Calcium: 8.9 mg/dL (ref 8.4–10.5)
GFR calc Af Amer: 31 mL/min — ABNORMAL LOW (ref >90)
GFR calc non Af Amer: 26 mL/min — ABNORMAL LOW (ref >90)
Sodium: 138 mEq/L (ref 135–145)

## 2013-01-17 LAB — TROPONIN I
Troponin I: <0.3 ng/mL (ref <0.30)
Troponin I: <0.3 ng/mL (ref <0.30)
Troponin I: <0.3 ng/mL (ref <0.30)

## 2013-01-17 LAB — MRSA PCR SCREENING: MRSA by PCR: NEGATIVE

## 2013-01-17 LAB — CBC
Platelets: 323 10*3/uL (ref 150–400)
RBC: 3.79 MIL/uL — ABNORMAL LOW (ref 3.87–5.11)
WBC: 9.3 10*3/uL (ref 4.0–10.5)

## 2013-01-17 MED ORDER — HYDRALAZINE HCL 25 MG PO TABS: 25.0000 mg | ORAL_TABLET | Freq: Three times a day (TID) | ORAL | Status: DC

## 2013-01-17 MED ORDER — LABETALOL HCL 200 MG PO TABS
200.0000 mg | ORAL_TABLET | Freq: Two times a day (BID) | ORAL | Status: DC
  Administered 2013-01-17 (×2): 200 mg via ORAL
  Filled 2013-01-17 (×2): qty 1

## 2013-01-17 MED ORDER — TOLTERODINE TARTRATE ER 4 MG PO CP24
4.0000 mg | ORAL_CAPSULE | Freq: Every day | ORAL | Status: DC
  Administered 2013-01-17: 4 mg via ORAL

## 2013-01-17 NOTE — Progress Notes (Signed)
UR chart review completed.  

## 2013-01-17 NOTE — Progress Notes (Signed)
[facility] PROGRESS NOTE  DORATHEA KIBLER [id number] DOB: 10/22/1950 DOA: 01/16/2013 PCP: Manon Hilding, MD  Assessment/Plan: 1. Hypertensive urgency.  Blood pressure is improving.  Added labetalol and hydralazine to regimen.  Continue to monitor blood pressure for stability. Check renal ultrasound. 2. Chest pain due to #1.  Improved. Cardiac enzymes negative. 3. Headache, likely related to #1.  Improving, continue to follow. 4. Anxiety,  Continue benzos 5. CKD. Creatinine appears to be near baseline.  Would anticipate mild decline in renal function due to hypertensive urgency. Continue to monitor.  Code Status: full code Family Communication: discussed with patient and husband Disposition Plan: discharge home once improved   Consultants:  none  Procedures:  none  Antibiotics:  none  HPI/Subjective: Chest pain improved, no shortness of breath, headache is better today  Objective: Filed Vitals:   01/17/13 0800  BP:   Pulse: 78  Temp: 98.2 F (36.8 C)  Resp: 34    Intake/Output Summary (Last 24 hours) at 01/17/13 1110 Last data filed at 01/17/13 0000  Gross per 24 hour  Intake    240 ml  Output      0 ml  Net    240 ml   Filed Weights   01/16/13 1743  Weight: 89.812 kg (198 lb)    Exam:   General:  NAD  Cardiovascular: S1, s2, RRR  Respiratory: CTA B  Abdomen: soft, nt, nd, bs+  Musculoskeletal: no edema b/l  Data Reviewed: Basic Metabolic Panel:  Recent Labs Lab 01/15/13 1636 01/16/13 1813 01/17/13 0521  NA 137 139 138  K 4.2 3.7 3.8  CL 99 98 101  CO2 27 28 27   GLUCOSE 137* 161* 135*  BUN 40* 34* 37*  CREATININE 2.06* 1.90* 1.95*  CALCIUM 9.5 9.3 8.9   Liver Function Tests:  Recent Labs Lab 01/16/13 1813  AST 13  ALT 13  ALKPHOS 100  BILITOT 0.3  PROT 8.2  ALBUMIN 3.9   No results found for this basename: LIPASE, AMYLASE,  in the last 168 hours No results found for this basename: AMMONIA,  in the last 168  hours CBC:  Recent Labs Lab 01/16/13 1813 01/17/13 0521  WBC 11.7* 9.3  NEUTROABS 7.4  --   HGB 12.5 10.7*  HCT 37.7 32.9*  MCV 85.9 86.8  PLT 381 323   Cardiac Enzymes:  Recent Labs Lab 01/16/13 1813 01/17/13 0018 01/17/13 0521  TROPONINI <0.30 <0.30 <0.30   BNP (last 3 results) No results found for this basename: PROBNP,  in the last 8760 hours CBG: No results found for this basename: GLUCAP,  in the last 168 hours  Recent Results (from the past 240 hour(s))  URINE CULTURE     Status: None   Collection Time    01/15/13  3:29 PM      Result Value Range Status   Specimen Description URINE, CLEAN CATCH   Final   Special Requests NONE   Final   Culture  Setup Time     Final   Value: 01/16/2013 01:41     Performed at [facility]     Final   Value: NO GROWTH     Performed at [facility]   Culture     Final   Value: NO GROWTH     Performed at [facility]   Report Status 01/16/2013 FINAL   Final  MRSA PCR SCREENING     Status: None   Collection Time  01/16/13 10:56 PM      Result Value Range Status   MRSA by PCR NEGATIVE  NEGATIVE Final   Comment:            The GeneXpert MRSA Assay (FDA     approved for NASAL specimens     only), is one component of a     comprehensive MRSA colonization     surveillance program. It is not     intended to diagnose MRSA     infection nor to guide or     monitor treatment for     MRSA infections.     Studies: Dg Chest 2 View  01/16/2013   CLINICAL DATA:  Chest pain.  EXAM: CHEST  2 VIEW  COMPARISON:  October 01, 2012.  FINDINGS: The heart size and mediastinal contours are within normal limits. Stable chronic bronchitic changes are noted. Mild hyperinflation of the lungs is noted. Both lungs are clear. The visualized skeletal structures are unremarkable.  IMPRESSION: No acute cardiopulmonary abnormality seen.   Electronically Signed   By: Sabino Dick M.D.   On: 01/16/2013 18:48   Ct  Head Wo Contrast  01/16/2013   CLINICAL DATA:  Headache and blurred vision  EXAM: CT HEAD WITHOUT CONTRAST  TECHNIQUE: Contiguous axial images were obtained from the base of the skull through the vertex without intravenous contrast.  COMPARISON:  10/03/2012  FINDINGS: Areas of white matter and cortical hypodensity are reidentified, and some demonstrate interval apparent evolution with development of focal encephalomalacia for example at the left parietal vertex image 25. No acute hemorrhage, infarct, or mass lesion is identified. No midline shift. No skull fracture. Left ethmoid mucous retention cyst or polyp again noted. Orbits are unremarkable.  IMPRESSION: Interval evolution of previously seen multi focal bilateral areas of white matter and subcortical ischemia. No new acute intracranial finding.   Electronically Signed   By: Conchita Paris M.D.   On: 01/16/2013 19:18    Scheduled Meds: . aspirin EC  81 mg Oral q morning - 10a  . bisacodyl  5 mg Oral QHS  . cloNIDine  0.3 mg Transdermal Weekly  . diltiazem  360 mg Oral Daily  . enoxaparin (LOVENOX) injection  40 mg Subcutaneous Q24H  . hydrALAZINE  25 mg Oral Q8H  . labetalol  200 mg Oral BID  . losartan  100 mg Oral Daily  . simvastatin  10 mg Oral q1800  . sodium chloride  3 mL Intravenous Q12H  . tolterodine  4 mg Oral QHS   Continuous Infusions:   Principal Problem:   Hypertensive urgency Active Problems:   Carotid stenosis   Anxiety state, unspecified   Headache   Chest pain   H/O: CVA (cerebrovascular accident)   CKD (chronic kidney disease)    Time spent: 30mins    MEMON,JEHANZEB  [facility] Pager [phone]. If 7PM-7AM, please contact night-coverage at [email or web address], password [facility] 01/17/2013, 11:10 AM  LOS: 1 day

## 2013-01-17 NOTE — Progress Notes (Signed)
Patient states she does not want the PNA vaccine and has not had it yet.

## 2013-01-17 NOTE — Progress Notes (Signed)
Patient c/o severe dizziness and nausea when staff was assisting her OOB to go to renal US. Neuro assessment within normal limits. BP 114/58 HR 62 RR 15 O2 sat 93% on room air.  1st dose of labetolol given at 1148. Dr Roderic Palau notified by phone of above, and plans to change medications.

## 2013-01-18 LAB — BASIC METABOLIC PANEL WITH GFR
BUN: 34 mg/dL — ABNORMAL HIGH (ref 6–23)
CO2: 26 meq/L (ref 19–32)
Calcium: 9 mg/dL (ref 8.4–10.5)
Chloride: 103 meq/L (ref 96–112)
Creatinine, Ser: 1.95 mg/dL — ABNORMAL HIGH (ref 0.50–1.10)
GFR calc Af Amer: 31 mL/min — ABNORMAL LOW (ref >90)
GFR calc non Af Amer: 26 mL/min — ABNORMAL LOW (ref >90)
Glucose, Bld: 125 mg/dL — ABNORMAL HIGH (ref 70–99)
Potassium: 4.3 meq/L (ref 3.5–5.1)
Sodium: 139 meq/L (ref 135–145)

## 2013-01-18 MED ORDER — DIPHENHYDRAMINE HCL 25 MG PO CAPS
25.0000 mg | ORAL_CAPSULE | Freq: Four times a day (QID) | ORAL | Status: DC | PRN
  Administered 2013-01-18: 25 mg via ORAL
  Filled 2013-01-18: qty 1

## 2013-01-18 MED ORDER — LABETALOL HCL 200 MG PO TABS
200.0000 mg | ORAL_TABLET | Freq: Three times a day (TID) | ORAL | Status: DC
  Administered 2013-01-18: 200 mg via ORAL
  Filled 2013-01-18: qty 1

## 2013-01-18 MED ORDER — LABETALOL HCL 200 MG PO TABS: 300.0000 mg | ORAL_TABLET | Freq: Two times a day (BID) | ORAL | Status: DC

## 2013-01-18 MED ORDER — HYDRALAZINE HCL 50 MG PO TABS: 50.0000 mg | ORAL_TABLET | Freq: Three times a day (TID) | ORAL | Status: DC

## 2013-01-18 MED ORDER — ONDANSETRON HCL 4 MG/2ML IJ SOLN
4.0000 mg | Freq: Four times a day (QID) | INTRAMUSCULAR | Status: DC | PRN
  Administered 2013-01-18: 4 mg via INTRAVENOUS
  Filled 2013-01-18: qty 2

## 2013-01-18 NOTE — Discharge Summary (Signed)
Physician Discharge Summary  Kelli [name] [id number] DOB: [birth year] DOA: 01/16/2013  PCP: Manon Hilding, MD  Admit date: 01/16/2013 Discharge date: 01/18/2013  Time spent: 45 minutes  Recommendations for Outpatient Follow-up:  1. Follow up with Nephrology in 3-4 weeks, patient to schedule appointment 2. Losartan has been discontinued due to renal dysfunction 3. Follow up with primary care doctor in 1-2 weeks for blood pressure check.  Discharge Diagnoses:  Principal Problem:   Hypertensive urgency Active Problems:   Carotid stenosis   Anxiety state, unspecified   Headache   Chest pain   H/O: CVA (cerebrovascular accident)   CKD (chronic kidney disease)   Discharge Condition: improved  Diet recommendation: low salt  Filed Weights   01/16/13 1743 01/18/13 0500  Weight: 89.812 kg (198 lb) 93.4 kg (205 lb 14.6 oz)    History of present illness:  62 yo female h/o hard to control htn, cva in august with no residual affects, fibromyalgia comes in with multiple readings at home of bp high sbp over 200. She complies with her home medication regimen denies missing any doses. She started having a headache and some sscp today so came to ED. No focal neuro def. She was given ntg and her cp and headache resolved in ED. No fevers. No n/v. No recent illnesses. Her bp is much improved now and she feels much better.   Hospital Course:  This patient was admitted to the hospital with hypertensive urgency and headache/chest pain.  Once her blood pressure improved, her headache/chest pain resolved.  She ruled out for ACS with negative cardiac markers.  It appeared that patient had some element of chronic kidney disease, likely stage 2-3.  Her creatinine was elevated on admission at 1.9 and remained stable throughout.  Renal ultrasound did not reveal any hydronephrosis, but interestingly showed asymmetric kidneys with her left kidney being smaller than right. This was discussed with nephrology  and it was recommended to stop patient's losartan until she is followed up in the outpatient setting.  She will need renal doppler studies which will need to be arranged by nephrology.  Regarding her blood pressure, she was continued on clonidine and diltiazem.  Losartan has been discontinued and she has been started on labetolol and hydralazine.  Her blood pressure is improved and she is no longer having headaches or chest pain.  She feels clinically improved.  She will follow up with nephrology for further management.  She will be discharged home today.  Procedures:  none  Consultations:  none  Discharge Exam: Filed Vitals:   01/18/13 1300  BP:   Pulse: 71  Temp:   Resp: 14    General: NAD Cardiovascular: S1, S2 RRR Respiratory: CTA B  Discharge Instructions  Discharge Orders   Future Appointments Provider Department Dept Phone   01/30/2013 9:20 AM Satira Sark, MD [facility] [place] [phone]   02/13/2013 2:00 PM Philmore Pali, NP [facility] [phone]   05/01/2013 3:00 PM [facility] [place] [phone]   05/01/2013 4:00 PM [name], MD [facility] [phone]   Future Orders Complete By Expires   Call MD for:  persistant dizziness or light-headedness  As directed    Call MD for:  severe uncontrolled pain  As directed    Diet - low sodium heart healthy  As directed    Increase activity slowly  As directed        Medication List  STOP taking these medications       losartan 100 MG tablet  Commonly known as:  COZAAR      TAKE these medications       aspirin EC 81 MG tablet  Take 81 mg by mouth every morning.     BESIVANCE 0.6 % Susp  Generic drug:  Besifloxacin HCl  Apply 1 drop to eye as directed. Per Procedural instructions     clonazePAM 0.5 MG tablet  Commonly known as:  KLONOPIN  Take 0.5 mg by mouth 2 (two) times daily as needed for anxiety.      cloNIDine 0.3 mg/24hr patch  Commonly known as:  CATAPRES-TTS-3  Place 1 patch (0.3 mg total) onto the skin once a week.     CORRECTOL 5 MG EC tablet  Generic drug:  bisacodyl  Take 5 mg by mouth at bedtime.     diltiazem 360 MG 24 hr capsule  Commonly known as:  TAZTIA XT  Take 1 capsule (360 mg total) by mouth daily.     DUREZOL 0.05 % Emul  Generic drug:  Difluprednate  Apply 1 drop to eye as directed. As directed per procedural instructions     hydrALAZINE 50 MG tablet  Commonly known as:  APRESOLINE  Take 1 tablet (50 mg total) by mouth 3 (three) times daily.     ketorolac 0.5 % ophthalmic solution  Commonly known as:  ACULAR  Place 1 drop into both eyes as directed. As directed per procedural instructions     labetalol 200 MG tablet  Commonly known as:  NORMODYNE  Take 1.5 tablets (300 mg total) by mouth 2 (two) times daily.     ranitidine 150 MG tablet  Commonly known as:  ZANTAC  Take 150 mg by mouth daily as needed for heartburn.     simvastatin 10 MG tablet  Commonly known as:  ZOCOR  Take 1 tablet (10 mg total) by mouth daily at 6 PM.     tolterodine 4 MG 24 hr capsule  Commonly known as:  DETROL LA  Take 1 capsule (4 mg total) by mouth daily.       Allergies  Allergen Reactions  . Hydrochlorothiazide Nausea And Vomiting       Follow-up Information   Follow up with [facility], MD. Schedule an appointment as soon as possible for a visit in 3 weeks.   Specialty:  Nephrology   Contact information:   [address] [address] [phone]       Follow up with Manon Hilding, MD. Schedule an appointment as soon as possible for a visit in 2 weeks.   Specialty:  Cardiology   Contact information:   [place] [address] [phone]        The results of significant diagnostics from this hospitalization (including imaging, microbiology, ancillary and laboratory) are listed below for reference.    Significant  Diagnostic Studies: Dg Chest 2 View  01/16/2013   CLINICAL DATA:  Chest pain.  EXAM: CHEST  2 VIEW  COMPARISON:  October 01, 2012.  FINDINGS: The heart size and mediastinal contours are within normal limits. Stable chronic bronchitic changes are noted. Mild hyperinflation of the lungs is noted. Both lungs are clear. The visualized skeletal structures are unremarkable.  IMPRESSION: No acute cardiopulmonary abnormality seen.   Electronically Signed   By: Sabino Dick M.D.   On: 01/16/2013 18:48   Ct Head Wo Contrast  01/16/2013   CLINICAL  DATA:  Headache and blurred vision  EXAM: CT HEAD WITHOUT CONTRAST  TECHNIQUE: Contiguous axial images were obtained from the base of the skull through the vertex without intravenous contrast.  COMPARISON:  10/03/2012  FINDINGS: Areas of white matter and cortical hypodensity are reidentified, and some demonstrate interval apparent evolution with development of focal encephalomalacia for example at the left parietal vertex image 25. No acute hemorrhage, infarct, or mass lesion is identified. No midline shift. No skull fracture. Left ethmoid mucous retention cyst or polyp again noted. Orbits are unremarkable.  IMPRESSION: Interval evolution of previously seen multi focal bilateral areas of white matter and subcortical ischemia. No new acute intracranial finding.   Electronically Signed   By: Conchita Paris M.D.   On: 01/16/2013 19:18   US Renal  01/17/2013   CLINICAL DATA:  Chronic kidney disease.  Uncontrolled hypertension  EXAM: RENAL/URINARY TRACT ULTRASOUND COMPLETE  COMPARISON:  None.  FINDINGS: Right Kidney  Length: 11.8 cm. Echogenicity within normal limits. No mass or hydronephrosis visualized.  Left Kidney  Length: 8.7 cm. Non shadowing echogenic focus midpole cortex measuring 5 mm. Question stone or small angiomyolipoma appear. Echogenicity within normal limits. No mass or hydronephrosis visualized.  Bladder  Normal bladder.  Ureteral jets not identified.   IMPRESSION: Left kidney is smaller than the right. 5 mm echogenic lesion in the left midpole cortex of questionable significance. This could represent a stone or fatty deposit. Negative for hydronephrosis.   Electronically Signed   By: Franchot Gallo M.D.   On: 01/17/2013 14:36    Microbiology: Recent Results (from the past 240 hour(s))  URINE CULTURE     Status: None   Collection Time    01/15/13  3:29 PM      Result Value Range Status   Specimen Description URINE, CLEAN CATCH   Final   Special Requests NONE   Final   Culture  Setup Time     Final   Value: 01/16/2013 01:41     Performed at [facility]     Final   Value: NO GROWTH     Performed at [facility]   Culture     Final   Value: NO GROWTH     Performed at [facility]   Report Status 01/16/2013 FINAL   Final  MRSA PCR SCREENING     Status: None   Collection Time    01/16/13 10:56 PM      Result Value Range Status   MRSA by PCR NEGATIVE  NEGATIVE Final   Comment:            The GeneXpert MRSA Assay (FDA     approved for NASAL specimens     only), is one component of a     comprehensive MRSA colonization     surveillance program. It is not     intended to diagnose MRSA     infection nor to guide or     monitor treatment for     MRSA infections.     Labs: Basic Metabolic Panel:  Recent Labs Lab 01/15/13 1636 01/16/13 1813 01/17/13 0521 01/18/13 0520  NA 137 139 138 139  K 4.2 3.7 3.8 4.3  CL 99 98 101 103  CO2 27 28 27 26   GLUCOSE 137* 161* 135* 125*  BUN 40* 34* 37* 34*  CREATININE 2.06* 1.90* 1.95* 1.95*  CALCIUM 9.5 9.3 8.9 9.0   Liver Function Tests:  Recent Labs Lab  01/16/13 1813  AST 13  ALT 13  ALKPHOS 100  BILITOT 0.3  PROT 8.2  ALBUMIN 3.9   No results found for this basename: LIPASE, AMYLASE,  in the last 168 hours No results found for this basename: AMMONIA,  in the last 168 hours CBC:  Recent Labs Lab 01/16/13 1813 01/17/13 0521  WBC  11.7* 9.3  NEUTROABS 7.4  --   HGB 12.5 10.7*  HCT 37.7 32.9*  MCV 85.9 86.8  PLT 381 323   Cardiac Enzymes:  Recent Labs Lab 01/16/13 1813 01/17/13 0018 01/17/13 0521 01/17/13 1143  TROPONINI <0.30 <0.30 <0.30 <0.30   BNP: BNP (last 3 results) No results found for this basename: PROBNP,  in the last 8760 hours CBG: No results found for this basename: GLUCAP,  in the last 168 hours     Signed:  Darnelle Derrick  [facility] 01/18/2013, 3:30 PM

## 2013-01-18 NOTE — Progress Notes (Signed)
Patient discharged to home with adult son. Discharge instructions regarding medications, activity, diet, and follow-up appointments given. Patient was able to "teach back" information. IV removed, site clean, dry, and intact.

## 2013-01-29 ENCOUNTER — Encounter: Payer: Self-pay | Admitting: Cardiology

## 2013-01-30 ENCOUNTER — Ambulatory Visit (INDEPENDENT_AMBULATORY_CARE_PROVIDER_SITE_OTHER): Payer: 59 | Admitting: Cardiology

## 2013-01-30 ENCOUNTER — Encounter: Payer: Self-pay | Admitting: Cardiology

## 2013-01-30 VITALS — BP 158/72 | HR 72 | Ht 65.0 in | Wt 211.0 lb

## 2013-01-30 DIAGNOSIS — E782 Mixed hyperlipidemia: Secondary | ICD-10-CM

## 2013-01-30 DIAGNOSIS — Z8673 Personal history of transient ischemic attack (TIA), and cerebral infarction without residual deficits: Secondary | ICD-10-CM | POA: Insufficient documentation

## 2013-01-30 DIAGNOSIS — I10 Essential (primary) hypertension: Secondary | ICD-10-CM

## 2013-01-30 DIAGNOSIS — R072 Precordial pain: Secondary | ICD-10-CM

## 2013-01-30 DIAGNOSIS — I63239 Cerebral infarction due to unspecified occlusion or stenosis of unspecified carotid arteries: Secondary | ICD-10-CM

## 2013-01-30 MED ORDER — DILTIAZEM HCL ER BEADS 360 MG PO CP24: 360.0000 mg | ORAL_CAPSULE | Freq: Every day | ORAL | Status: DC

## 2013-01-30 MED ORDER — HYDRALAZINE HCL 50 MG PO TABS: 50.0000 mg | ORAL_TABLET | Freq: Three times a day (TID) | ORAL | Status: DC

## 2013-01-30 NOTE — Assessment & Plan Note (Signed)
Reviewed home blood pressure checks, recent workup, and current medical regimen. Our plan at this time will be to continue labetalol, adjust time of day of hydralazine use to provide more even coverage, move Cardizem CD to the evening, stay on clonidine patch. Renal artery duplex scan will be obtained to objectively evaluate for possible renal artery stenosis in light of asymmetric kidney size and renal dysfunction. She should also keep her visit with Dr. Cristela Blue. Followup arranged in the next few weeks.

## 2013-01-30 NOTE — Assessment & Plan Note (Signed)
On Zocor, followed by Dr. Quintin Alto.

## 2013-01-30 NOTE — Assessment & Plan Note (Signed)
Bihemispheric watershed infarcts as noted above, has undergone rehabilitation, continues to follow with Neurology. She is on aspirin and statin.

## 2013-01-30 NOTE — Assessment & Plan Note (Signed)
Followup pending with Dr. Lowanda Foster. Arranging renal artery Dopplers to assess for RAS. Holding off addition of ACE inhibitor or ARB at this point.

## 2013-01-30 NOTE — Patient Instructions (Addendum)
Your physician recommends that you schedule a follow-up appointment in: 3 weeks  Please stay on Labetalol until further notice  Please take Hydralazine at 8 am,2 pm, and 10 pm  Take Diltiazem at bedside  Your physician has requested that you have a lexiscan myoview. For further information please visit [email or web address]. Please follow instruction sheet, as given.  Your physician has requested that you have a renal artery duplex. During this test, an ultrasound is used to evaluate blood flow to the kidneys. Allow one hour for this exam. Do not eat after midnight the day before and avoid carbonated beverages. Take your medications as you usually do.  We will discuss results at 3 week visit

## 2013-01-30 NOTE — Assessment & Plan Note (Signed)
Intermittent, not clearly exertional. Also with associated fatigue and documentation of atherosclerotic disease. We will obtain a Lexiscan Cardiolite for formal ischemic workup as she almost certainly has some degree of underlying CAD.

## 2013-01-30 NOTE — Assessment & Plan Note (Signed)
Occluded RICA, status post left CEA in August by Dr. Donnetta Hutching.

## 2013-01-30 NOTE — Progress Notes (Signed)
Clinical Summary Kelli [name] is a 62 y.o.female referred for cardiology consultation by Dr. Quintin Alto. Available patient history reviewed and outlined below. She comes in to discuss her antihypertensive regimen. Most recently, hydralazine and labetalol were added. We reviewed her home blood pressure checks. Blood pressure tends to be the highest in the morning before she takes her medications. She feels fatigue after taking her morning medications, also tells me that she feels chest tightness at times. Not entirely certain whether this is secondary to her labetalol or not. She feels that she might have mild wheezing after the medicine. No history of COPD or asthma.  Echocardiogram from August of this year showed LVEF 0000000, diastolic dysfunction, no major valvular abnormalities. Recent lab work in late November showed potassium 4.3, BUN 34, creatinine 1.9, hemoglobin 10.7, platelets 323. ECG from November showed sinus rhythm with decreased R-wave progression and nonspecific ST-T changes. She has pending consultation scheduled with Dr. Lowanda Foster.  Abdominal ultrasound done recently showed left kidney 8.6 cm, smaller than right kidney measuring 11.8 cm. No definite history of renal artery stenosis at this point.  In addition to carotid artery disease she also had evidence of proximal left subclavian artery occlusion with antegrade flow in the vertebral artery on the left and the dominant right vertebral artery. Blood pressures not reliable in the left side. I checked in both arms today.  She has no documented history of obstructive CAD or myocardial infarction, although certainly at high risk in light of recent documentation of stroke and severe bilateral carotid artery disease.   Allergies  Allergen Reactions  . Hydrochlorothiazide Nausea And Vomiting    Current Outpatient Prescriptions  Medication Sig Dispense Refill  . aspirin EC 81 MG tablet Take 81 mg by mouth every morning.      Marland Kitchen BESIVANCE  0.6 % SUSP Apply 1 drop to eye as directed. Per Procedural instructions      . bisacodyl (CORRECTOL) 5 MG EC tablet Take 5 mg by mouth at bedtime.      . clonazePAM (KLONOPIN) 0.5 MG tablet Take 0.5 mg by mouth 2 (two) times daily as needed for anxiety.      . cloNIDine (CATAPRES-TTS-3) 0.3 mg/24hr patch Place 1 patch (0.3 mg total) onto the skin once a week.  4 patch  12  . diltiazem (TAZTIA XT) 360 MG 24 hr capsule Take 1 capsule (360 mg total) by mouth daily.  30 capsule  3  . DUREZOL 0.05 % EMUL Apply 1 drop to eye as directed. As directed per procedural instructions      . hydrALAZINE (APRESOLINE) 50 MG tablet Take 1 tablet (50 mg total) by mouth 3 (three) times daily.  90 tablet  3  . ketorolac (ACULAR) 0.5 % ophthalmic solution Place 1 drop into both eyes as directed. As directed per procedural instructions      . labetalol (NORMODYNE) 200 MG tablet Take 1.5 tablets (300 mg total) by mouth 2 (two) times daily.  90 tablet  1  . simvastatin (ZOCOR) 10 MG tablet Take 1 tablet (10 mg total) by mouth daily at 6 PM.  30 tablet  1  . tolterodine (DETROL LA) 4 MG 24 hr capsule Take 1 capsule (4 mg total) by mouth daily.  30 capsule  3   No current facility-administered medications for this visit.    Past Medical History  Diagnosis Date  . Essential hypertension, benign   . Fibromyalgia   . Cerebral infarction     Bihemispheric  watershed infarcts  . Mixed hyperlipidemia   . Borderline diabetes   . Urinary incontinence   . Carotid artery occlusion     Occluded RICA, status post left CEA  August 2014 - Dr. Donnetta Hutching  . CKD (chronic kidney disease) stage 3, GFR 30-59 ml/min     Past Surgical History  Procedure Laterality Date  . Urethral dilation  1980's  . Combined hysterectomy vaginal w/ mmk / a&p repair  1981  . Vaginal hysterectomy  1981    "partial" (10/04/2012)  . Endarterectomy Left 10/06/2012    Procedure: Carotid Endarterectomy with Finesse patch angioplasty;  Surgeon: [name],  MD;  Location: [facility];  Service: Vascular;  Laterality: Left;    Family History  Problem Relation Age of Onset  . Diabetes Mother   . Diabetes Father   . Hypertension Sister   . Diabetes Brother   . Seizures Son   . Kidney disease Maternal Grandmother   . Hypertension Maternal Grandmother   . Heart disease Maternal Grandfather   . Diabetes Paternal Grandmother   . Heart disease Paternal Grandfather     Social History Kelli [name] reports that she quit smoking about 3 months ago. Her smoking use included Cigarettes. She has a 44 pack-year smoking history. She has never used smokeless tobacco. Kelli [name] reports that she does not drink alcohol.  Review of Systems Weak and fatigued. Gaining weight. No orthopnea or PND. No reported bleeding problems. Stable appetite. Otherwise as outlined above.  Physical Examination Filed Vitals:   01/30/13 0859  BP: 158/72  Pulse: 72   Filed Weights   01/30/13 0859  Weight: 211 lb (95.709 kg)   Chronically ill-appearing woman, no distress. HEENT: Conjunctiva and lids normal, oropharynx clear. Neck: Supple, no elevated JVP, bilateral carotid bruits, no thyromegaly. Lungs: Decreased breath sounds , no wheezing, nonlabored breathing at rest. Cardiac: Regular rate and rhythm, no S3, soft systolic murmur, no pericardial rub. Abdomen: Soft, nontender, bowel sounds present, no guarding or rebound. Extremities: 1+ edema, distal pulses 1-2+. Skin: Warm and dry. Musculoskeletal: No kyphosis. Neuropsychiatric: Alert and oriented x3, affect grossly appropriate.   Problem List and Plan   Malignant hypertension Reviewed home blood pressure checks, recent workup, and current medical regimen. Our plan at this time will be to continue labetalol, adjust time of day of hydralazine use to provide more even coverage, move Cardizem CD to the evening, stay on clonidine patch. Renal artery duplex scan will be obtained to objectively evaluate for possible renal  artery stenosis in light of asymmetric kidney size and renal dysfunction. She should also keep her visit with Dr. Cristela Blue. Followup arranged in the next few weeks.  Precordial pain Intermittent, not clearly exertional. Also with associated fatigue and documentation of atherosclerotic disease. We will obtain a Lexiscan Cardiolite for formal ischemic workup as she almost certainly has some degree of underlying CAD.  Occlusion and stenosis of carotid artery with cerebral infarction Occluded RICA, status post left CEA in August by Dr. Donnetta Hutching.  CKD (chronic kidney disease) stage 3, GFR 30-59 ml/min Followup pending with Dr. Lowanda Foster. Arranging renal artery Dopplers to assess for RAS. Holding off addition of ACE inhibitor or ARB at this point.  Mixed hyperlipidemia On Zocor, followed by Dr. Quintin Alto.  History of stroke Bihemispheric watershed infarcts as noted above, has undergone rehabilitation, continues to follow with Neurology. She is on aspirin and statin.    Satira Sark, M.D., F.A.C.C.

## 2013-02-01 ENCOUNTER — Encounter: Payer: Self-pay | Admitting: Cardiology

## 2013-02-01 ENCOUNTER — Ambulatory Visit (HOSPITAL_COMMUNITY): Payer: 59 | Attending: Cardiology

## 2013-02-01 DIAGNOSIS — I714 Abdominal aortic aneurysm, without rupture, unspecified: Secondary | ICD-10-CM

## 2013-02-01 DIAGNOSIS — K551 Chronic vascular disorders of intestine: Secondary | ICD-10-CM | POA: Insufficient documentation

## 2013-02-01 DIAGNOSIS — I77819 Aortic ectasia, unspecified site: Secondary | ICD-10-CM | POA: Insufficient documentation

## 2013-02-01 DIAGNOSIS — K122 Cellulitis and abscess of mouth: Secondary | ICD-10-CM

## 2013-02-01 DIAGNOSIS — N183 Chronic kidney disease, stage 3 unspecified: Secondary | ICD-10-CM | POA: Insufficient documentation

## 2013-02-01 DIAGNOSIS — I129 Hypertensive chronic kidney disease with stage 1 through stage 4 chronic kidney disease, or unspecified chronic kidney disease: Secondary | ICD-10-CM | POA: Insufficient documentation

## 2013-02-01 DIAGNOSIS — I10 Essential (primary) hypertension: Secondary | ICD-10-CM

## 2013-02-06 ENCOUNTER — Telehealth: Payer: Self-pay

## 2013-02-06 NOTE — Telephone Encounter (Signed)
Refill authorization sent to pharmacy.

## 2013-02-09 ENCOUNTER — Encounter (HOSPITAL_COMMUNITY): Payer: 59

## 2013-02-09 ENCOUNTER — Other Ambulatory Visit (HOSPITAL_COMMUNITY): Payer: 59

## 2013-02-12 ENCOUNTER — Telehealth: Payer: Self-pay | Admitting: Cardiology

## 2013-02-12 NOTE — Telephone Encounter (Signed)
Please call patient to discuss details of myoview / tgs

## 2013-02-12 NOTE — Telephone Encounter (Signed)
Pt advised she had talked to [place] with [place] office about the test, pt understood the test and rescheduled test for after Christmas on 03-06-2013, pt aware someone will call her with the myoview results once they are resolved

## 2013-02-13 ENCOUNTER — Encounter (HOSPITAL_COMMUNITY): Payer: 59

## 2013-02-13 ENCOUNTER — Telehealth: Payer: Self-pay

## 2013-02-13 ENCOUNTER — Ambulatory Visit: Payer: 59 | Admitting: Nurse Practitioner

## 2013-02-13 ENCOUNTER — Other Ambulatory Visit (HOSPITAL_COMMUNITY): Payer: 59

## 2013-02-15 NOTE — Telephone Encounter (Signed)
Error see previous message regarding rx refill

## 2013-02-27 ENCOUNTER — Ambulatory Visit: Payer: 59 | Admitting: Cardiology

## 2013-03-05 ENCOUNTER — Telehealth: Payer: Self-pay | Admitting: Nurse Practitioner

## 2013-03-05 NOTE — Telephone Encounter (Signed)
Called Ms. Shilling, she will call to reschedule her appointment for 2-3 months.  She has had many appointments with different providers. Thanks.

## 2013-03-06 ENCOUNTER — Ambulatory Visit: Payer: 59 | Admitting: Nurse Practitioner

## 2013-03-07 ENCOUNTER — Encounter (HOSPITAL_COMMUNITY): Payer: Self-pay

## 2013-03-07 ENCOUNTER — Encounter (HOSPITAL_COMMUNITY)
Admission: RE | Admit: 2013-03-07 | Discharge: 2013-03-07 | Disposition: A | Discharge status: Home / Self Care | Payer: 59 | Source: Ambulatory Visit | Attending: Cardiology | Admitting: Cardiology

## 2013-03-07 DIAGNOSIS — R079 Chest pain, unspecified: Secondary | ICD-10-CM | POA: Insufficient documentation

## 2013-03-07 DIAGNOSIS — R072 Precordial pain: Secondary | ICD-10-CM

## 2013-03-07 DIAGNOSIS — I10 Essential (primary) hypertension: Secondary | ICD-10-CM

## 2013-03-07 MED ORDER — REGADENOSON 0.4 MG/5ML IV SOLN
INTRAVENOUS | Status: AC
  Administered 2013-03-07: 0.4 mg via INTRAVENOUS
  Filled 2013-03-07: qty 5

## 2013-03-07 MED ORDER — SODIUM CHLORIDE 0.9 % IJ SOLN
INTRAMUSCULAR | Status: AC
  Administered 2013-03-07: 10 mL via INTRAVENOUS
  Filled 2013-03-07: qty 10

## 2013-03-07 MED ORDER — TECHNETIUM TC 99M SESTAMIBI GENERIC - CARDIOLITE
10.0000 | Freq: Once | INTRAVENOUS | Status: AC | PRN
  Administered 2013-03-07: 10 via INTRAVENOUS

## 2013-03-07 MED ORDER — TECHNETIUM TC 99M SESTAMIBI - CARDIOLITE
30.0000 | Freq: Once | INTRAVENOUS | Status: AC | PRN
  Administered 2013-03-07: 10:00:00 30 via INTRAVENOUS

## 2013-03-07 NOTE — Progress Notes (Signed)
Stress Lab Nurses Notes - Kelli [name]  Kelli [name] 03/07/2013 Reason for doing test: Chest Pain and HTN Type of test: Wille Glaser Nurse performing test: Gerrit Halls, RN Nuclear Medicine Tech: Dyanne Carrel Echo Tech: Not Applicable MD performing test: Dr. Lynann Beaver. Lawrence NP Family MD: Dr. Quintin Alto Test explained and consent signed: yes IV started: 22g jelco, Saline lock flushed, No redness or edema and Saline lock started in radiology Symptoms: SOB Treatment/Intervention: None Reason test stopped: protocol completed After recovery IV was: Discontinued via X-ray tech and No redness or edema Patient to return to [place]. Med at : 11:15 Patient discharged: Home [name] Condition upon discharge was: stable Comments: During test BP 143/62 & HR 81.  Recovery BP 149/53 & HR 77.  Symptoms resolved in recovery.  [name] T

## 2013-03-12 ENCOUNTER — Ambulatory Visit: Payer: 59 | Admitting: Cardiology

## 2013-03-16 ENCOUNTER — Ambulatory Visit (INDEPENDENT_AMBULATORY_CARE_PROVIDER_SITE_OTHER): Payer: 59 | Admitting: Cardiology

## 2013-03-16 ENCOUNTER — Encounter: Payer: Self-pay | Admitting: Cardiology

## 2013-03-16 VITALS — BP 180/59 | HR 69 | Ht 65.0 in | Wt 213.0 lb

## 2013-03-16 DIAGNOSIS — R072 Precordial pain: Secondary | ICD-10-CM

## 2013-03-16 DIAGNOSIS — I10 Essential (primary) hypertension: Secondary | ICD-10-CM

## 2013-03-16 DIAGNOSIS — N183 Chronic kidney disease, stage 3 unspecified: Secondary | ICD-10-CM

## 2013-03-16 DIAGNOSIS — I739 Peripheral vascular disease, unspecified: Secondary | ICD-10-CM | POA: Insufficient documentation

## 2013-03-16 MED ORDER — HYDRALAZINE HCL 50 MG PO TABS: ORAL_TABLET | ORAL | Status: DC

## 2013-03-16 MED ORDER — AMLODIPINE BESYLATE 10 MG PO TABS: ORAL_TABLET | ORAL | Status: DC

## 2013-03-16 MED ORDER — LABETALOL HCL 200 MG PO TABS: ORAL_TABLET | ORAL | Status: DC

## 2013-03-16 NOTE — Patient Instructions (Addendum)
Your physician recommends that you schedule a follow-up appointment in: [place] has recommended you make the following change in your medication:   1) STOP TAKING DILTIAZEM 2) START TAKING NORVASC 10MG  ONCE DAILY AT 8AM 3) INCREASE LABETALOL TO 400MG  TWICE DAILY, TAKE AT 8AM AND AT 8PM 4) DECREASE HYDRALAZINE TO 50MG  TWICE DAILY, TAKE AT 10AM AND 10PM   You have been referred to DR [facility] A staff member from our office will alert you the with appointment date and time, once available

## 2013-03-16 NOTE — Assessment & Plan Note (Signed)
This has not been a recurring problem recently. Recent Cardiolite demonstrated no obvious ischemia. I still suspect that she has certainly some degree of underlying coronary atherosclerosis. If symptoms escalate may need to consider invasive testing.

## 2013-03-16 NOTE — Assessment & Plan Note (Signed)
We discussed the situation including her recent testing and reviewed her medications. Plan at this time is to discontinue Cardizem CD and initiate Norvasc 10 mg to be taken at 8 in the morning. Labetalol be increased to 400 mg twice daily, also be taken as an initial morning medication. Hydralazine is being changed to 50 mg twice daily to be taken between 10 and 12 in the morning, not the same time is her first medications. Otherwise continue clonidine patch. Try and work on a walking regimen. Sodium restriction already discussed.

## 2013-03-16 NOTE — Assessment & Plan Note (Signed)
She is followed by Dr. Donnetta Hutching. Has history of occluded RICA, prior left CEA, no obvious renal artery stenosis by recent Dopplers but apparent occluded distal aorta.

## 2013-03-16 NOTE — Assessment & Plan Note (Signed)
Referral made for her to see nephrology as previously recommended.

## 2013-03-16 NOTE — Progress Notes (Signed)
Clinical Summary Kelli [name] is a 63 y.o.female last seen in December 2014. Medication doses were kept the same with change in timing. She was also referred for followup testing. She tells me that she feels fatigued, especially after taking her morning medications, is essentially only able to take care of her ADLs. Not exercising.  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 Lab work from November 2014 showed BUN 34 and creatinine 1.9. She did not followup with nephrology yet.  Lexiscan Cardiolite done recently demonstrated no evidence of active ischemia with LVEF 71%. She does not describe any chest pain. Chronically short of breath. Has arm pain at nighttime. We discussed the results of her testing.   Allergies  Allergen Reactions  . Hydrochlorothiazide Nausea And Vomiting    Current Outpatient Prescriptions  Medication Sig Dispense Refill  . aspirin EC 81 MG tablet Take 81 mg by mouth every morning.      Marland Kitchen BESIVANCE 0.6 % SUSP Apply 1 drop to eye as directed. Per Procedural instructions      . clonazePAM (KLONOPIN) 0.5 MG tablet Take 0.5 mg by mouth 2 (two) times daily as needed for anxiety.      . cloNIDine (CATAPRES-TTS-3) 0.3 mg/24hr patch Place 1 patch (0.3 mg total) onto the skin once a week.  4 patch  12  . diltiazem (TAZTIA XT) 360 MG 24 hr capsule Take 1 capsule (360 mg total) by mouth daily.  30 capsule  3  . DUREZOL 0.05 % EMUL Apply 1 drop to eye as directed. As directed per procedural instructions      . hydrALAZINE (APRESOLINE) 50 MG tablet Take 1 tablet (50 mg total) by mouth 3 (three) times daily.  90 tablet  3  . ketorolac (ACULAR) 0.5 % ophthalmic solution Place 1 drop into both eyes as directed. As directed per procedural instructions      . labetalol (NORMODYNE) 200 MG tablet  Take 1.5 tablets (300 mg total) by mouth 2 (two) times daily.  90 tablet  1  . simvastatin (ZOCOR) 10 MG tablet Take 1 tablet (10 mg total) by mouth daily at 6 PM.  30 tablet  1  . tolterodine (DETROL LA) 4 MG 24 hr capsule Take 1 capsule (4 mg total) by mouth daily.  30 capsule  3   No current facility-administered medications for this visit.    Past Medical History  Diagnosis Date  . Essential hypertension, benign   . Fibromyalgia   . Cerebral infarction     Bihemispheric watershed infarcts  . Mixed hyperlipidemia   . Borderline diabetes   . Urinary incontinence   . Carotid artery occlusion     Occluded RICA, status post left CEA  August 2014 - Dr. Donnetta Hutching  . CKD (chronic kidney disease) stage 3, GFR 30-59 ml/min     Social History Kelli [name] reports that she quit smoking about 5 months ago. Her smoking use included Cigarettes. She has a 44 pack-year smoking history. She has never used smokeless tobacco. Kelli [name] reports that she does not drink alcohol.  Review of Systems No palpitations or syncope. Weight is up a few pounds since December. Otherwise as outlined.  Physical Examination Filed Vitals:   03/16/13 1102  BP: 180/59  Pulse: 69   Filed Weights   03/16/13 1102  Weight: 213 lb (96.616 kg)    Chronically ill-appearing woman, no distress.  HEENT: Conjunctiva and lids normal, oropharynx clear.  Neck: Supple, no elevated JVP, bilateral carotid bruits, no thyromegaly.  Lungs: Decreased breath sounds , no wheezing, nonlabored breathing at rest.  Cardiac: Regular rate and rhythm, no S3, soft systolic murmur, no pericardial rub.  Abdomen: Soft, nontender, bowel sounds present, no guarding or rebound.  Extremities: 1+ edema, distal pulses 1-2+.    Problem List and Plan   Malignant hypertension We discussed the situation including her recent testing and reviewed her medications. Plan at this time is to discontinue Cardizem CD and initiate Norvasc 10 mg to be taken at 8  in the morning. Labetalol be increased to 400 mg twice daily, also be taken as an initial morning medication. Hydralazine is being changed to 50 mg twice daily to be taken between 10 and 12 in the morning, not the same time is her first medications. Otherwise continue clonidine patch. Try and work on a walking regimen. Sodium restriction already discussed.  Peripheral arterial disease She is followed by Dr. Donnetta Hutching. Has history of occluded RICA, prior left CEA, no obvious renal artery stenosis by recent Dopplers but apparent occluded distal aorta.  CKD (chronic kidney disease) stage 3, GFR 30-59 ml/min Referral made for her to see nephrology as previously recommended.  Precordial pain This has not been a recurring problem recently. Recent Cardiolite demonstrated no obvious ischemia. I still suspect that she has certainly some degree of underlying coronary atherosclerosis. If symptoms escalate may need to consider invasive testing.    Satira Sark, M.D., F.A.C.C.

## 2013-03-20 ENCOUNTER — Telehealth: Payer: Self-pay | Admitting: Cardiology

## 2013-03-20 NOTE — Telephone Encounter (Signed)
Patient has questions regarding medication changes from visit on 1 16/15/tgs

## 2013-03-20 NOTE — Telephone Encounter (Signed)
Pt advised Dr. Domenic Polite changed when she takes some of her medications and she is confused and wanted to confirm the correct, noted the following d/c instructions  1) STOP TAKING DILTIAZEM  2) START TAKING NORVASC 10MG  ONCE DAILY AT 8AM  3) INCREASE LABETALOL TO 400MG  TWICE DAILY, TAKE AT 8AM AND AT 8PM  4) DECREASE HYDRALAZINE TO 50MG  TWICE DAILY, TAKE AT 10AM AND 10PM   Pt noted that she is feeling a lot better already, she has a lot of energy and notes her BP is going down also, pt did realize that she was only taking labetalol 200mg  twice daily this morning and wanted to confirm the correct dosage when she found her discharge summary, the pt will increase to 400mg  (2 tablets) twice daily as advised and call back with any further concerns if noted.

## 2013-03-29 ENCOUNTER — Other Ambulatory Visit: Payer: Self-pay | Admitting: Vascular Surgery

## 2013-03-29 DIAGNOSIS — Z48812 Encounter for surgical aftercare following surgery on the circulatory system: Secondary | ICD-10-CM

## 2013-03-29 DIAGNOSIS — I6529 Occlusion and stenosis of unspecified carotid artery: Secondary | ICD-10-CM

## 2013-04-01 DIAGNOSIS — I63542 Cerebral infarction due to unspecified occlusion or stenosis of left cerebellar artery: Secondary | ICD-10-CM

## 2013-04-01 HISTORY — DX: Cerebral infarction due to unspecified occlusion or stenosis of left cerebellar artery: I63.542

## 2013-04-06 ENCOUNTER — Ambulatory Visit (INDEPENDENT_AMBULATORY_CARE_PROVIDER_SITE_OTHER): Payer: 59 | Admitting: Cardiology

## 2013-04-06 ENCOUNTER — Encounter: Payer: Self-pay | Admitting: Cardiology

## 2013-04-06 VITALS — BP 174/72 | HR 84 | Ht 65.0 in | Wt 215.0 lb

## 2013-04-06 DIAGNOSIS — I739 Peripheral vascular disease, unspecified: Secondary | ICD-10-CM

## 2013-04-06 DIAGNOSIS — F3289 Other specified depressive episodes: Secondary | ICD-10-CM

## 2013-04-06 DIAGNOSIS — N183 Chronic kidney disease, stage 3 unspecified: Secondary | ICD-10-CM

## 2013-04-06 DIAGNOSIS — F32A Depression, unspecified: Secondary | ICD-10-CM | POA: Insufficient documentation

## 2013-04-06 DIAGNOSIS — F329 Major depressive disorder, single episode, unspecified: Secondary | ICD-10-CM

## 2013-04-06 DIAGNOSIS — I10 Essential (primary) hypertension: Secondary | ICD-10-CM

## 2013-04-06 MED ORDER — HYDRALAZINE HCL 50 MG PO TABS: 50.0000 mg | ORAL_TABLET | Freq: Three times a day (TID) | ORAL | Status: DC

## 2013-04-06 MED ORDER — HYDRALAZINE HCL 50 MG PO TABS: 50.0000 mg | ORAL_TABLET | Freq: Two times a day (BID) | ORAL | Status: DC

## 2013-04-06 MED ORDER — ATENOLOL 25 MG PO TABS: 25.0000 mg | ORAL_TABLET | Freq: Every day | ORAL | Status: DC

## 2013-04-06 NOTE — Progress Notes (Signed)
Clinical Summary Kelli [name] is a 63 y.o.female seen recently in January 2015. Medications were adjusted at that time as discussed in the prior office visit note. She is here with her husband today. She schedule an early followup visit. She indicates that she has been taking her medications as directed and we did review these today. Systolic blood pressures have been ranging from the 130s to 190s at home. She complains of having no energy, chronically fatigued, not sleeping well, also tearful today in speaking about her symptoms. She feels dizzy sometimes. The only time that she feels good she says is in the morning when she first gets up.  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 Lexiscan Cardiolite done recently demonstrated no evidence of active ischemia with LVEF 71%.   Today we discussed her blood pressure. It is not at all clear to me that all of her reported symptoms are secondary to either her blood pressure control, or necessarily her medications. She could be having side effects however. I am also concerned about the possibility of depression or other contributing factors. I encouraged her to see her primary care doctor to talk about this. She does state that she has had a history of clinical depression in the past. Currently not on any antidepressants.   Allergies  Allergen Reactions  . Hydrochlorothiazide Nausea And Vomiting    Current Outpatient Prescriptions  Medication Sig Dispense Refill  . amLODipine (NORVASC) 10 MG tablet TAKE DAILY AT 8AM  90 tablet  1  . aspirin EC 81 MG tablet Take 81 mg by mouth every morning.      . clonazePAM (KLONOPIN) 0.5 MG tablet Take 0.5 mg by mouth 2 (two) times daily as needed for anxiety.      . cloNIDine (CATAPRES-TTS-3) 0.3 mg/24hr patch Place 1 patch  (0.3 mg total) onto the skin once a week.  4 patch  12  . labetalol (NORMODYNE) 200 MG tablet TAKE 400MG  (2 TABLETS) TWICE DAILY AT 8AM AND 8PM  120 tablet  6  . simvastatin (ZOCOR) 10 MG tablet Take 1 tablet (10 mg total) by mouth daily at 6 PM.  30 tablet  1  . tolterodine (DETROL LA) 4 MG 24 hr capsule Take 1 capsule (4 mg total) by mouth daily.  30 capsule  3  . atenolol (TENORMIN) 25 MG tablet Take 1 tablet (25 mg total) by mouth daily.  180 tablet  3   No current facility-administered medications for this visit.    Past Medical History  Diagnosis Date  . Essential hypertension, benign   . Fibromyalgia   . Cerebral infarction     Bihemispheric watershed infarcts  . Mixed hyperlipidemia   . Borderline diabetes   . Urinary incontinence   . Carotid artery occlusion     Occluded RICA, status post left CEA  August 2014 - Dr. Donnetta Hutching  . CKD (chronic kidney disease) stage 3, GFR 30-59 ml/min     Social History Kelli [name] reports that she quit smoking about 6 months ago. Her smoking use included Cigarettes. She has a 44 pack-year smoking history. She has never used smokeless tobacco. Kelli [name] reports that she does not drink alcohol.  Review of Systems No chest pain, palpitations, syncope. Appetite stable. Otherwise as outlined.  Physical Examination Filed Vitals:   04/06/13  1425  BP: 174/72  Pulse: 84   Filed Weights   04/06/13 1425  Weight: 215 lb (97.523 kg)    Chronically ill-appearing woman, no distress.  HEENT: Conjunctiva and lids normal, oropharynx clear.  Neck: Supple, no elevated JVP, bilateral carotid bruits, no thyromegaly.  Lungs: Decreased breath sounds , no wheezing, nonlabored breathing at rest.  Cardiac: Regular rate and rhythm, no S3, soft systolic murmur, no pericardial rub.  Abdomen: Soft, nontender, bowel sounds present, no guarding or rebound.  Extremities: 1+ edema, distal pulses 1-2+.    Problem List and Plan   Hypertension, uncontrolled We  reviewed her medications in detail. I would like to try and see she feels any better by stopping labetalol and switching to atenolol. She will otherwise continue her current regimen. Office followup is arranged.  CKD (chronic kidney disease) stage 3, GFR 30-59 ml/min Pending follow up with nephrology in March. We are holding off use of ARB or ACE inhibitor.  Depression See above discussion. She has a prior history of clinical depression, currently not on an antidepressant. I have encouraged her strongly to see her primary care provider and discuss whether this needs further evaluation or management. Some of her symptoms could be associated with depression.  Peripheral arterial disease She is followed by Dr. Donnetta Hutching. Has history of occluded RICA, prior left CEA, no obvious renal artery stenosis by recent Dopplers but apparent occluded distal aorta.    Satira Sark, M.D., F.A.C.C.

## 2013-04-06 NOTE — Assessment & Plan Note (Signed)
Pending follow up with nephrology in March. We are holding off use of ARB or ACE inhibitor.

## 2013-04-06 NOTE — Patient Instructions (Addendum)
Your physician recommends that you schedule a follow-up appointment in: one month   Your physician has recommended you make the following change in your medication:    STOP : Labetolol  START :  Atenolol 25 mg daily    Thanks for choosing [facility] !

## 2013-04-06 NOTE — Addendum Note (Signed)
Addended by: Barbarann Ehlers A on: 04/06/2013 04:23 PM   Modules accepted: Orders

## 2013-04-06 NOTE — Assessment & Plan Note (Signed)
She is followed by Dr. Donnetta Hutching. Has history of occluded RICA, prior left CEA, no obvious renal artery stenosis by recent Dopplers but apparent occluded distal aorta.

## 2013-04-06 NOTE — Assessment & Plan Note (Signed)
We reviewed her medications in detail. I would like to try and see she feels any better by stopping labetalol and switching to atenolol. She will otherwise continue her current regimen. Office followup is arranged.

## 2013-04-06 NOTE — Addendum Note (Signed)
Addended by: Barbarann Ehlers A on: 04/06/2013 03:33 PM   Modules accepted: Orders, Medications

## 2013-04-06 NOTE — Assessment & Plan Note (Signed)
See above discussion. She has a prior history of clinical depression, currently not on an antidepressant. I have encouraged her strongly to see her primary care provider and discuss whether this needs further evaluation or management. Some of her symptoms could be associated with depression.

## 2013-04-08 ENCOUNTER — Inpatient Hospital Stay (HOSPITAL_COMMUNITY)
Admission: EM | Admit: 2013-04-08 | Discharge: 2013-04-17 | DRG: 065 | Disposition: A | Discharge status: Rehabilitation Facility | Payer: 59 | Attending: Internal Medicine | Admitting: Internal Medicine

## 2013-04-08 ENCOUNTER — Inpatient Hospital Stay (HOSPITAL_COMMUNITY): Payer: 59

## 2013-04-08 ENCOUNTER — Encounter (HOSPITAL_COMMUNITY): Payer: Self-pay | Admitting: Emergency Medicine

## 2013-04-08 ENCOUNTER — Emergency Department (HOSPITAL_COMMUNITY): Payer: 59

## 2013-04-08 DIAGNOSIS — Z79899 Other long term (current) drug therapy: Secondary | ICD-10-CM

## 2013-04-08 DIAGNOSIS — I739 Peripheral vascular disease, unspecified: Secondary | ICD-10-CM | POA: Diagnosis present

## 2013-04-08 DIAGNOSIS — IMO0001 Reserved for inherently not codable concepts without codable children: Secondary | ICD-10-CM | POA: Diagnosis present

## 2013-04-08 DIAGNOSIS — F32A Depression, unspecified: Secondary | ICD-10-CM | POA: Diagnosis present

## 2013-04-08 DIAGNOSIS — E782 Mixed hyperlipidemia: Secondary | ICD-10-CM

## 2013-04-08 DIAGNOSIS — R209 Unspecified disturbances of skin sensation: Secondary | ICD-10-CM

## 2013-04-08 DIAGNOSIS — R42 Dizziness and giddiness: Secondary | ICD-10-CM

## 2013-04-08 DIAGNOSIS — H532 Diplopia: Secondary | ICD-10-CM

## 2013-04-08 DIAGNOSIS — Z8673 Personal history of transient ischemic attack (TIA), and cerebral infarction without residual deficits: Secondary | ICD-10-CM

## 2013-04-08 DIAGNOSIS — F411 Generalized anxiety disorder: Secondary | ICD-10-CM | POA: Diagnosis present

## 2013-04-08 DIAGNOSIS — M6281 Muscle weakness (generalized): Secondary | ICD-10-CM

## 2013-04-08 DIAGNOSIS — Z7982 Long term (current) use of aspirin: Secondary | ICD-10-CM

## 2013-04-08 DIAGNOSIS — N183 Chronic kidney disease, stage 3 unspecified: Secondary | ICD-10-CM

## 2013-04-08 DIAGNOSIS — G8190 Hemiplegia, unspecified affecting unspecified side: Secondary | ICD-10-CM | POA: Diagnosis present

## 2013-04-08 DIAGNOSIS — I63239 Cerebral infarction due to unspecified occlusion or stenosis of unspecified carotid arteries: Secondary | ICD-10-CM | POA: Diagnosis present

## 2013-04-08 DIAGNOSIS — F329 Major depressive disorder, single episode, unspecified: Secondary | ICD-10-CM | POA: Diagnosis present

## 2013-04-08 DIAGNOSIS — R29898 Other symptoms and signs involving the musculoskeletal system: Secondary | ICD-10-CM | POA: Diagnosis present

## 2013-04-08 DIAGNOSIS — R202 Paresthesia of skin: Secondary | ICD-10-CM | POA: Diagnosis present

## 2013-04-08 DIAGNOSIS — I6350 Cerebral infarction due to unspecified occlusion or stenosis of unspecified cerebral artery: Principal | ICD-10-CM

## 2013-04-08 DIAGNOSIS — F3289 Other specified depressive episodes: Secondary | ICD-10-CM | POA: Diagnosis present

## 2013-04-08 DIAGNOSIS — R531 Weakness: Secondary | ICD-10-CM

## 2013-04-08 DIAGNOSIS — Z720 Tobacco use: Secondary | ICD-10-CM

## 2013-04-08 DIAGNOSIS — R7303 Prediabetes: Secondary | ICD-10-CM

## 2013-04-08 DIAGNOSIS — I129 Hypertensive chronic kidney disease with stage 1 through stage 4 chronic kidney disease, or unspecified chronic kidney disease: Secondary | ICD-10-CM | POA: Diagnosis present

## 2013-04-08 DIAGNOSIS — I10 Essential (primary) hypertension: Secondary | ICD-10-CM

## 2013-04-08 DIAGNOSIS — I639 Cerebral infarction, unspecified: Secondary | ICD-10-CM | POA: Diagnosis present

## 2013-04-08 HISTORY — DX: Cerebral infarction due to unspecified occlusion or stenosis of left cerebellar artery: I63.542

## 2013-04-08 LAB — DIFFERENTIAL
BASOS ABS: 0.1 10*3/uL (ref 0.0–0.1)
BASOS PCT: 1 % (ref 0–1)
Eosinophils Absolute: 0.4 10*3/uL (ref 0.0–0.7)
Eosinophils Relative: 4 % (ref 0–5)
Lymphocytes Relative: 27 % (ref 12–46)
Lymphs Abs: 2.7 10*3/uL (ref 0.7–4.0)
Monocytes Absolute: 0.9 10*3/uL (ref 0.1–1.0)
Monocytes Relative: 9 % (ref 3–12)
NEUTROS ABS: 5.9 10*3/uL (ref 1.7–7.7)
Neutrophils Relative %: 60 % (ref 43–77)

## 2013-04-08 LAB — CBC
HEMATOCRIT: 34.9 % — AB (ref 36.0–46.0)
HEMOGLOBIN: 11.5 g/dL — AB (ref 12.0–15.0)
MCH: 28.2 pg (ref 26.0–34.0)
MCHC: 33 g/dL (ref 30.0–36.0)
MCV: 85.5 fL (ref 78.0–100.0)
PLATELETS: 300 10*3/uL (ref 150–400)
RBC: 4.08 MIL/uL (ref 3.87–5.11)
RDW: 13.6 % (ref 11.5–15.5)
WBC: 9.8 10*3/uL (ref 4.0–10.5)

## 2013-04-08 LAB — APTT: aPTT: 26 seconds (ref 24–37)

## 2013-04-08 LAB — URINALYSIS, ROUTINE W REFLEX MICROSCOPIC
Bilirubin Urine: NEGATIVE
Glucose, UA: NEGATIVE mg/dL
HGB URINE DIPSTICK: NEGATIVE
KETONES UR: NEGATIVE mg/dL
Leukocytes, UA: NEGATIVE
NITRITE: NEGATIVE
Protein, ur: 100 mg/dL — AB
SPECIFIC GRAVITY, URINE: 1.02 (ref 1.005–1.030)
Urobilinogen, UA: 0.2 mg/dL (ref 0.0–1.0)
pH: 7 (ref 5.0–8.0)

## 2013-04-08 LAB — PROTIME-INR
INR: 0.97 (ref 0.00–1.49)
Prothrombin Time: 12.7 seconds (ref 11.6–15.2)

## 2013-04-08 LAB — RAPID URINE DRUG SCREEN, HOSP PERFORMED
Amphetamines: NOT DETECTED
BARBITURATES: NOT DETECTED
Benzodiazepines: NOT DETECTED
COCAINE: NOT DETECTED
Opiates: NOT DETECTED
TETRAHYDROCANNABINOL: NOT DETECTED

## 2013-04-08 LAB — COMPREHENSIVE METABOLIC PANEL
ALBUMIN: 3.6 g/dL (ref 3.5–5.2)
ALT: 19 U/L (ref 0–35)
AST: 16 U/L (ref 0–37)
Alkaline Phosphatase: 80 U/L (ref 39–117)
BUN: 29 mg/dL — ABNORMAL HIGH (ref 6–23)
CALCIUM: 8.8 mg/dL (ref 8.4–10.5)
CHLORIDE: 101 meq/L (ref 96–112)
CO2: 24 meq/L (ref 19–32)
Creatinine, Ser: 1.69 mg/dL — ABNORMAL HIGH (ref 0.50–1.10)
GFR calc Af Amer: 36 mL/min — ABNORMAL LOW (ref >90)
GFR, EST NON AFRICAN AMERICAN: 31 mL/min — AB (ref >90)
Glucose, Bld: 114 mg/dL — ABNORMAL HIGH (ref 70–99)
Potassium: 4.4 mEq/L (ref 3.7–5.3)
SODIUM: 139 meq/L (ref 137–147)
Total Bilirubin: 0.3 mg/dL (ref 0.3–1.2)
Total Protein: 7.7 g/dL (ref 6.0–8.3)

## 2013-04-08 LAB — GLUCOSE, CAPILLARY
Glucose-Capillary: 106 mg/dL — ABNORMAL HIGH (ref 70–99)
Glucose-Capillary: 189 mg/dL — ABNORMAL HIGH (ref 70–99)

## 2013-04-08 LAB — OCCULT BLOOD GASTRIC / DUODENUM (SPECIMEN CUP)
Occult Blood, Gastric: POSITIVE — AB
pH, Gastric: 2

## 2013-04-08 LAB — POCT I-STAT, CHEM 8
BUN: 29 mg/dL — ABNORMAL HIGH (ref 6–23)
CREATININE: 1.9 mg/dL — AB (ref 0.50–1.10)
Calcium, Ion: 1.14 mmol/L (ref 1.13–1.30)
Chloride: 103 mEq/L (ref 96–112)
Glucose, Bld: 114 mg/dL — ABNORMAL HIGH (ref 70–99)
HCT: 35 % — ABNORMAL LOW (ref 36.0–46.0)
HEMOGLOBIN: 11.9 g/dL — AB (ref 12.0–15.0)
Potassium: 4.1 mEq/L (ref 3.7–5.3)
SODIUM: 141 meq/L (ref 137–147)
TCO2: 25 mmol/L (ref 0–100)

## 2013-04-08 LAB — POCT I-STAT TROPONIN I: Troponin i, poc: 0 ng/mL (ref 0.00–0.08)

## 2013-04-08 LAB — URINE MICROSCOPIC-ADD ON

## 2013-04-08 LAB — TROPONIN I: Troponin I: <0.3 ng/mL (ref <0.30)

## 2013-04-08 LAB — ETHANOL: Alcohol, Ethyl (B): <11 mg/dL (ref 0–11)

## 2013-04-08 MED ORDER — SIMVASTATIN 10 MG PO TABS
10.0000 mg | ORAL_TABLET | Freq: Every day | ORAL | Status: DC
  Administered 2013-04-09 – 2013-04-15 (×7): 10 mg via ORAL
  Filled 2013-04-08 (×10): qty 1

## 2013-04-08 MED ORDER — INSULIN ASPART 100 UNIT/ML ~~LOC~~ SOLN
0.0000 [IU] | Freq: Three times a day (TID) | SUBCUTANEOUS | Status: DC
  Administered 2013-04-09: 1 [IU] via SUBCUTANEOUS
  Administered 2013-04-09: 12:00:00 via SUBCUTANEOUS
  Administered 2013-04-09 – 2013-04-15 (×14): 1 [IU] via SUBCUTANEOUS
  Administered 2013-04-15: 2 [IU] via SUBCUTANEOUS
  Administered 2013-04-17 (×3): 1 [IU] via SUBCUTANEOUS

## 2013-04-08 MED ORDER — AMLODIPINE BESYLATE 10 MG PO TABS
10.0000 mg | ORAL_TABLET | Freq: Every day | ORAL | Status: DC
  Administered 2013-04-09 – 2013-04-17 (×8): 10 mg via ORAL
  Filled 2013-04-08 (×9): qty 1

## 2013-04-08 MED ORDER — FESOTERODINE FUMARATE ER 8 MG PO TB24
8.0000 mg | ORAL_TABLET | Freq: Every day | ORAL | Status: DC
  Administered 2013-04-09 – 2013-04-17 (×8): 8 mg via ORAL
  Filled 2013-04-08 (×9): qty 1

## 2013-04-08 MED ORDER — ASPIRIN 325 MG PO TABS
325.0000 mg | ORAL_TABLET | Freq: Every day | ORAL | Status: DC
  Filled 2013-04-08: qty 1

## 2013-04-08 MED ORDER — PROMETHAZINE HCL 25 MG/ML IJ SOLN
INTRAMUSCULAR | Status: AC
  Filled 2013-04-08: qty 1

## 2013-04-08 MED ORDER — HYDRALAZINE HCL 20 MG/ML IJ SOLN
10.0000 mg | Freq: Four times a day (QID) | INTRAMUSCULAR | Status: DC | PRN
  Administered 2013-04-09 – 2013-04-16 (×3): 10 mg via INTRAVENOUS
  Filled 2013-04-08 (×2): qty 1

## 2013-04-08 MED ORDER — SENNOSIDES-DOCUSATE SODIUM 8.6-50 MG PO TABS
1.0000 | ORAL_TABLET | Freq: Every evening | ORAL | Status: DC | PRN
  Administered 2013-04-14: 1 via ORAL
  Filled 2013-04-08: qty 1

## 2013-04-08 MED ORDER — ONDANSETRON HCL 4 MG/2ML IJ SOLN
4.0000 mg | Freq: Once | INTRAMUSCULAR | Status: AC
  Administered 2013-04-08: 4 mg via INTRAVENOUS

## 2013-04-08 MED ORDER — CLONAZEPAM 0.5 MG PO TABS
0.5000 mg | ORAL_TABLET | Freq: Two times a day (BID) | ORAL | Status: DC | PRN
  Administered 2013-04-09 – 2013-04-15 (×3): 0.5 mg via ORAL
  Filled 2013-04-08 (×3): qty 1

## 2013-04-08 MED ORDER — ONDANSETRON HCL 4 MG/2ML IJ SOLN
4.0000 mg | Freq: Once | INTRAMUSCULAR | Status: DC
  Filled 2013-04-08: qty 2

## 2013-04-08 MED ORDER — SODIUM CHLORIDE 0.9 % IV SOLN
INTRAVENOUS | Status: DC
  Administered 2013-04-08: 22:00:00 via INTRAVENOUS

## 2013-04-08 MED ORDER — ASPIRIN 300 MG RE SUPP
300.0000 mg | Freq: Every day | RECTAL | Status: DC
  Administered 2013-04-08 – 2013-04-09 (×2): 300 mg via RECTAL
  Filled 2013-04-08: qty 1

## 2013-04-08 MED ORDER — ASPIRIN 81 MG PO CHEW
324.0000 mg | CHEWABLE_TABLET | Freq: Once | ORAL | Status: AC
  Administered 2013-04-08: 324 mg via ORAL
  Filled 2013-04-08: qty 4

## 2013-04-08 MED ORDER — ONDANSETRON HCL 4 MG/2ML IJ SOLN
4.0000 mg | Freq: Four times a day (QID) | INTRAMUSCULAR | Status: DC | PRN
  Administered 2013-04-08 – 2013-04-09 (×2): 4 mg via INTRAVENOUS
  Filled 2013-04-08 (×2): qty 2

## 2013-04-08 MED ORDER — ENOXAPARIN SODIUM 40 MG/0.4ML ~~LOC~~ SOLN
40.0000 mg | Freq: Every day | SUBCUTANEOUS | Status: DC
  Administered 2013-04-08 – 2013-04-15 (×8): 40 mg via SUBCUTANEOUS
  Filled 2013-04-08 (×9): qty 0.4

## 2013-04-08 MED ORDER — ACETAMINOPHEN 325 MG PO TABS
650.0000 mg | ORAL_TABLET | ORAL | Status: DC | PRN
  Administered 2013-04-10 – 2013-04-15 (×5): 650 mg via ORAL
  Filled 2013-04-08 (×7): qty 2

## 2013-04-08 MED ORDER — ATENOLOL 25 MG PO TABS
25.0000 mg | ORAL_TABLET | Freq: Every day | ORAL | Status: DC
  Administered 2013-04-09: 25 mg via ORAL
  Filled 2013-04-08 (×2): qty 1

## 2013-04-08 MED ORDER — HYDRALAZINE HCL 50 MG PO TABS
50.0000 mg | ORAL_TABLET | Freq: Two times a day (BID) | ORAL | Status: DC
  Filled 2013-04-08 (×4): qty 1

## 2013-04-08 MED ORDER — INSULIN ASPART 100 UNIT/ML ~~LOC~~ SOLN
0.0000 [IU] | Freq: Every day | SUBCUTANEOUS | Status: DC
Start: 2013-04-08 — End: 2013-04-17

## 2013-04-08 MED ORDER — ACETAMINOPHEN 650 MG RE SUPP: 650.0000 mg | RECTAL | Status: DC | PRN

## 2013-04-08 MED ORDER — PROMETHAZINE HCL 25 MG/ML IJ SOLN
12.5000 mg | Freq: Once | INTRAMUSCULAR | Status: AC
  Administered 2013-04-08: 12.5 mg via INTRAVENOUS

## 2013-04-08 MED ORDER — CLONIDINE HCL 0.3 MG/24HR TD PTWK
0.3000 mg | MEDICATED_PATCH | TRANSDERMAL | Status: DC
  Administered 2013-04-10 – 2013-04-17 (×2): 0.3 mg via TRANSDERMAL
  Filled 2013-04-08 (×2): qty 1

## 2013-04-08 MED ORDER — ONDANSETRON HCL 4 MG/2ML IJ SOLN
INTRAMUSCULAR | Status: AC
  Administered 2013-04-08: 4 mg via INTRAVENOUS
  Filled 2013-04-08: qty 2

## 2013-04-08 NOTE — ED Notes (Signed)
Positive Gastrocult called to [place], Agricultural consultant at [employer].  Was told he would notify physician.  Also notifed Dr. Wyvonnia Dusky.

## 2013-04-08 NOTE — Consult Note (Signed)
[facility] History and Physical  Kelli [name] [id number] DOB: [birth year] DOA: 04/08/2013  Referring physician: Dr. Wyvonnia Dusky PCP: Manon Hilding, MD   Chief Complaint: Dizziness, left-sided numbness, and left-sided weakness.  HPI: Kelli [name] is a 63 y.o. female with a history of bihemispheric watershed strokes, left carotid artery stenosis, status post left CEA in August 2014, malignant hypertension, and prediabetes, who presents to the emergency department with a complaint of dizziness, left-sided numbness and tingling, and left-sided weakness. This afternoon, at approximately 2:30 PM, she was sitting reading. All of a sudden, she felt numbness and tingling on the left side of her mouth, tongue, and arm. She also had difficulty picking up items with her left hand, although she is right-handed. She began having dizziness which is described as a lightheadedness and not necessarily vertiginous. She had no visual changes and no headache at the time. She denies any preceding chest pain, palpitations, nausea and vomiting, or shortness of breath. She denies slurred speech or facial droop, however, her husband says that she is speaking slower than usual. She denies not being able to comprehend directions. Her cardiologist, Dr. Domenic Polite had recently made some changes in her antihypertensive medications. Specifically, labetalol was discontinued in favor of atenolol. Over the past several days, her blood pressure has been ranging in the [id number] systolically. She takes an 81 mg aspirin daily.  In the emergency department, she is afebrile and hemodynamically stable, although moderately hypertensive with a blood pressure 160/64. Her lab data are significant for hemoglobin of 11.9, BUN of 29, and creatinine of 1.9. Her glucose is 114.for urine drug screen is negative. EKG reveals normal sinus rhythm with a heart rate of 95 beats per minute and no concerning ST or T wave abnormalities. Noncontrasted CT scan of the  head reveals no acute intracranial abnormality, but with stable chronic small vessel white matter ischemic changes and old lacunar infarcts.  During my evaluation of the patient in the emergency department, she suddenly began to complain of the inability to see. She complained of blurred vision and near blindness intermittent with double vision. She complained of dizziness, but this time it was described as vertigo. She suddenly became nauseated as if she was going to vomit. She also complained of a feeling of leaning to her left side although she was not. ED physician, Dr. Wyvonnia Dusky was notified. He has consulted with neurologist on call Dr. Nicole Kindred and the tele-neurologist. An MRI/MRA will be ordered since a brain CTA cannot be done because of her chronic kidney disease. The patient will be transferred to [facility].    Review of Systems:  As above in history present illness, otherwise negative.  Past Medical History  Diagnosis Date  . Essential hypertension, benign   . Fibromyalgia   . Cerebral infarction     Bihemispheric watershed infarcts  . Mixed hyperlipidemia   . Borderline diabetes   . Urinary incontinence   . Carotid artery occlusion     Occluded RICA, status post left CEA  August 2014 - Dr. Donnetta Hutching  . CKD (chronic kidney disease) stage 3, GFR 30-59 ml/min    Past Surgical History  Procedure Laterality Date  . Urethral dilation  1980's  . Combined hysterectomy vaginal w/ mmk / a&p repair  1981  . Vaginal hysterectomy  1981    "partial" (10/04/2012)  . Endarterectomy Left 10/06/2012    Procedure: Carotid Endarterectomy with Finesse patch angioplasty;  Surgeon: [name], MD;  Location: [facility];  Service: Vascular;  Laterality: Left;   Social History: She reports that she quit smoking about 6 months ago. Her smoking use included Cigarettes. She has a 44 pack-year smoking history. She has never used smokeless tobacco. She reports that she does not drink alcohol or use illicit  drugs.  Allergies  Allergen Reactions  . Hydrochlorothiazide Nausea And Vomiting    Family History  Problem Relation Age of Onset  . Diabetes Mother   . Diabetes Father   . Hypertension Sister   . Diabetes Brother   . Seizures Son   . Kidney disease Maternal Grandmother   . Hypertension Maternal Grandmother   . Heart disease Maternal Grandfather   . Diabetes Paternal Grandmother   . Heart disease Paternal Grandfather      Prior to Admission medications   Medication Sig Start Date End Date Taking? Authorizing Provider  amLODipine (NORVASC) 10 MG tablet Take 10 mg by mouth daily.   Yes Historical Provider, MD  aspirin EC 81 MG tablet Take 81 mg by mouth every morning.   Yes Historical Provider, MD  atenolol (TENORMIN) 25 MG tablet Take 1 tablet (25 mg total) by mouth daily. 04/06/13  Yes Satira Sark, MD  clonazePAM (KLONOPIN) 0.5 MG tablet Take 0.5 mg by mouth 2 (two) times daily as needed for anxiety.   Yes Historical Provider, MD  cloNIDine (CATAPRES - DOSED IN MG/24 HR) 0.3 mg/24hr patch Place 0.3 mg onto the skin once a week. On Tuesday   Yes Historical Provider, MD  hydrALAZINE (APRESOLINE) 50 MG tablet Take 1 tablet (50 mg total) by mouth 2 (two) times daily before a meal. 04/06/13  Yes Satira Sark, MD  simvastatin (ZOCOR) 10 MG tablet Take 1 tablet (10 mg total) by mouth daily at 6 PM. 10/19/12  Yes Lavon Paganini Angiulli, PA-C  tolterodine (DETROL LA) 4 MG 24 hr capsule Take 1 capsule (4 mg total) by mouth daily. 11/14/12  Yes Meredith Staggers, MD   Physical Exam: Filed Vitals:   04/08/13 1600  BP: 168/64  Pulse: 92  Temp:   Resp: 14    BP 168/64  Pulse 92  Temp(Src) 98.5 F (36.9 C)  Resp 14  Ht 5\' 5"  (1.651 m)  Wt 97.523 kg (215 lb)  BMI 35.78 kg/m2  SpO2 94%  General: On initial examination, the patient was ambulating slowly to the sphincter watch her hands after using the bedside commode. There was no ataxia or left foot drag, but her gait was  purposeful. Eyes: PERRL, normal lids, irises & conjunctiva, mild lateral nystagmus. ENT: grossly normal hearing, lips & tongue Neck: no LAD, masses or thyromegaly; query mild bruit on the left. Cardiovascular: S1, S2, with a soft systolic murmur. Pedal pulses palpable. No pedal edema. Telemetry: SR, no arrhythmias  Respiratory: CTA bilaterally, no w/r/r. Normal respiratory effort. Abdomen: soft, obese, positive bowel sounds, soft, nontender, nondistended. Skin: no rash or induration seen on limited exam Musculoskeletal: grossly normal tone BUE/BLE Psychiatric: Initial flat affect, but her affect became distressed with complaints of vertigo and nausea. Neurologic: The patient is alert and oriented x3. Cranial nerves II through XII are grossly intact with exception of slightly decreased sensation on the left side of her face. She had a mild left pronator drift. Left upper and left lower extremity strength was 4+ over 5 strength of the right upper right lower extremity was 5 over 5. However, during the evaluation, she complained of blurred vision, then double vision, then vertigo. On exam, she did  have mild lateral nystagmus bilaterally and decrease in overall visual acuity with the number of fingers that were held up in front of her. She became nauseated and almost vomited.           Labs on Admission:  Basic Metabolic Panel:  Recent Labs Lab 04/08/13 1510 04/08/13 1522  NA 139 141  K 4.4 4.1  CL 101 103  CO2 24  --   GLUCOSE 114* 114*  BUN 29* 29*  CREATININE 1.69* 1.90*  CALCIUM 8.8  --    Liver Function Tests:  Recent Labs Lab 04/08/13 1510  AST 16  ALT 19  ALKPHOS 80  BILITOT 0.3  PROT 7.7  ALBUMIN 3.6   No results found for this basename: LIPASE, AMYLASE,  in the last 168 hours No results found for this basename: AMMONIA,  in the last 168 hours CBC:  Recent Labs Lab 04/08/13 1510 04/08/13 1522  WBC 9.8  --   NEUTROABS 5.9  --   HGB 11.5* 11.9*  HCT 34.9*  35.0*  MCV 85.5  --   PLT 300  --    Cardiac Enzymes:  Recent Labs Lab 04/08/13 1510  TROPONINI <0.30    BNP (last 3 results) No results found for this basename: PROBNP,  in the last 8760 hours CBG:  Recent Labs Lab 04/08/13 1515  GLUCAP 106*    Radiological Exams on Admission: Ct Head Wo Contrast  04/08/2013   CLINICAL DATA:  Bilateral arm numbness.  EXAM: CT HEAD WITHOUT CONTRAST  TECHNIQUE: Contiguous axial images were obtained from the base of the skull through the vertex without intravenous contrast.  COMPARISON:  01/16/2013.  FINDINGS: No significant change in patchy white matter low density in both cerebral hemispheres and small areas of more focal low density. Stable old left basal ganglia lacunar infarct or prominent perivascular space. The ventricles remain normal in size and position. No intracranial hemorrhage, mass lesion or CT evidence of acute infarction. Unremarkable bones and included paranasal sinuses.  IMPRESSION: 1. No acute abnormality. 2. Stable chronic small vessel white matter ischemic changes and old lacunar infarcts. These results were called by telephone at the time of interpretation on 04/08/2013 at 3:29 PM to Dr. Ezequiel Essex , who verbally acknowledged these results.   Electronically Signed   By: Enrique Sack M.D.   On: 04/08/2013 15:31    EKG: Independently reviewed. As above in history present illness.  Assessment/Plan/recommendation Principal Problem:   Acute ischemic stroke Active Problems:   Occlusion and stenosis of carotid artery with cerebral infarction   Diplopia   Left-sided weakness   Vertigo   Paresthesias in left hand   Hypertension, uncontrolled   CKD (chronic kidney disease) stage 3, GFR 30-59 ml/min   Pre-diabetes   1. This is a 63 year old woman with a history of previous bihemispheric watershed strokes, left carotid artery stenosis, status post carotid endarterectomy in August 2014, hypertension, and chronic kidney disease, who  presents with symptomatology consistent with an acute right brain stroke and/or posterior circulation stroke. Per my conversation with the ED physician, the plan was to admit her to [facility] after his conversation with the tele-neurologist and neurologist on-call at [facility], Dr. Nicole Kindred. Her initial NIH score was low and she did not meet criteria for TPA. However, during the course of my evaluation, the patient's neurological status worsen. Therefore, she is being transferred to [facility] ER where the neurologist  can evaluate her there. CT angiogram of the head  was considered, however, it could not be done because of the patient's chronic kidney disease. MRI is not available at [facility] during the weekend. Therefore, she is being transferred as above. Of note, she did not initially passed the swallow evaluation, but subsequently passed it. She was given an aspirin. She was given IV Zofran for symptomatic nausea. 2. Hypertension, history of malignant hypertension. She is followed by cardiologist, Dr. Domenic Polite. He had recently changed some of her antihypertensive medications. Atenolol was started and labetalol and diltiazem were discontinued. In addition to atenolol, she is treated with clonidine and amlodipine. Would recommend not decreasing her blood pressure significantly over the next 24 hours due to the ischemic stroke. She has worsening cranial nerve deficits, would treat her hypertension with when necessary IV hydralazine or transdermal Catapres. 3. Stage III chronic kidney disease. Her baseline creatinine is 1.7-1.9. It is currently at baseline. 4. Peripheral vascular disease/history of carotid artery stenosis. She is status post left CEA August 2014. She has a history of an apparent occluded distal aorta, but no obvious renal artery stenosis per recent Dopplers. She is followed by Dr. Donnetta Hutching. 5. Query CAD. The patient's recent Lexi scan Cardiolite demonstrated no  evidence of active ischemia with LVEF of 71%. 6. Prediabetes. Her current blood sugar is within normal limits. Continue to monitor. 7. Dyslipidemia. She is treated chronically with Zocor. This should be continued.       Code Status: Full code Family Communication: Discuss with her husband.   Time spent: One hour.  [facility]

## 2013-04-08 NOTE — ED Notes (Signed)
During assessment with hospitalist, patient c/o dizziness, blurred vision, and "I feel like I'm falling to the left". + nausea.

## 2013-04-08 NOTE — ED Notes (Signed)
[facility] at bedside 

## 2013-04-08 NOTE — ED Notes (Signed)
Pt arrived at the bridge. Taken straight to MRI with rapid response RN

## 2013-04-08 NOTE — ED Notes (Signed)
Patient returned to room from MRI

## 2013-04-08 NOTE — H&P (Signed)
[name] PCP: Manon Hilding, MD  Chief Complaint: Left sided numbness and weakness  History of Present Illness: Please refer to consult note by Dr. Caryn Section, for more details.  Kelli [name] is a 63 y.o. Caucasian female with history of bihemispheric watershed strokes, left carotid artery stenosis status post left CEA in August of 2014, malignant hypertension, and prediabetes who initially presented to [facility] emergency department with complaints of dizziness, left-sided numbness, tingling, and weakness.  Patient is slightly lethargic from the Phenergan she received, but arouses easily.  Family at bedside provided most of the history.  Symptoms started approximately 2 p.m. today while she was reading, she noticed sudden onset of left-sided numbness, tingling, and dizziness.  She was brought to the emergency department at [facility], code stroke was called and she was transferred to [facility] as they do not have MRI capabilities on the weekend.  MRI of the brain showed acute infarct within the left cerebellum, patient was evaluated by neurology.  Hospitalist service was asked that the patient for further care and management.  Patient denies any recent fevers, chills,chest pain, shortness of breath, abdominal pain, or diarrhea.  Patient reported having nausea and vomiting at [facility] for which she received Phenergan.  [name] emesis was gastric occult positive, however no overt bleeding was noted.  Review of Systems: All systems reviewed with the patient and positive as per history of present illness, otherwise all other systems are negative.  Past Medical History  Diagnosis Date  . Essential hypertension, benign   . Fibromyalgia   . Cerebral infarction     Bihemispheric watershed infarcts  . Mixed hyperlipidemia   . Borderline diabetes   . Urinary incontinence   . Carotid artery occlusion     Occluded RICA, status post left CEA  August 2014 - Dr. Donnetta Hutching  . CKD (chronic kidney  disease) stage 3, GFR 30-59 ml/min    Past Surgical History  Procedure Laterality Date  . Urethral dilation  1980's  . Combined hysterectomy vaginal w/ mmk / a&p repair  1981  . Vaginal hysterectomy  1981    "partial" (10/04/2012)  . Endarterectomy Left 10/06/2012    Procedure: Carotid Endarterectomy with Finesse patch angioplasty;  Surgeon: [name], MD;  Location: [facility] OR;  Service: Vascular;  Laterality: Left;   Family History  Problem Relation Age of Onset  . Diabetes Mother   . Diabetes Father   . Hypertension Sister   . Diabetes Brother   . Seizures Son   . Kidney disease Maternal Grandmother   . Hypertension Maternal Grandmother   . Heart disease Maternal Grandfather   . Diabetes Paternal Grandmother   . Heart disease Paternal Grandfather    History   Social History  . Marital Status: Married    Spouse Name: N/A    Number of Children: N/A  . Years of Education: N/A   Occupational History  . Not on file.   Social History Main Topics  . Smoking status: Former Smoker -- 1.00 packs/day for 44 years    Types: Cigarettes    Quit date: 10/03/2012  . Smokeless tobacco: Never Used  . Alcohol Use: No  . Drug Use: No  . Sexual Activity: Yes    Birth Control/ Protection: Surgical   Other Topics Concern  . Not on file   Social History Narrative  . No narrative on file   Allergies: Hydrochlorothiazide  Home Meds: Prior to Admission medications   Medication Sig Start Date  End Date Taking? Authorizing Provider  amLODipine (NORVASC) 10 MG tablet Take 10 mg by mouth daily.   Yes Historical Provider, MD  aspirin EC 81 MG tablet Take 81 mg by mouth every morning.   Yes Historical Provider, MD  atenolol (TENORMIN) 25 MG tablet Take 1 tablet (25 mg total) by mouth daily. 04/06/13  Yes Satira Sark, MD  clonazePAM (KLONOPIN) 0.5 MG tablet Take 0.5 mg by mouth 2 (two) times daily as needed for anxiety.   Yes Historical Provider, MD  cloNIDine (CATAPRES - DOSED IN MG/24  HR) 0.3 mg/24hr patch Place 0.3 mg onto the skin once a week. On Tuesday   Yes Historical Provider, MD  hydrALAZINE (APRESOLINE) 50 MG tablet Take 1 tablet (50 mg total) by mouth 2 (two) times daily before a meal. 04/06/13  Yes Satira Sark, MD  simvastatin (ZOCOR) 10 MG tablet Take 1 tablet (10 mg total) by mouth daily at 6 PM. 10/19/12  Yes Lavon Paganini Angiulli, PA-C  tolterodine (DETROL LA) 4 MG 24 hr capsule Take 1 capsule (4 mg total) by mouth daily. 11/14/12  Yes Meredith Staggers, MD    Physical Exam: Blood pressure 170/104, pulse 98, temperature 98.5 F (36.9 C), resp. rate 15, height 5\' 5"  (1.651 m), weight 97.523 kg (215 lb), SpO2 94.00%. General: Awake, somnolent but easily arousable, No acute distress, follows commands. HEENT: EOMI, Moist mucous membranes Neck: Supple CV: S1 and S2 Lungs: Clear to ascultation bilaterally Abdomen: Soft, Nontender, Nondistended, +bowel sounds. Ext: Good pulses. Trace edema. No clubbing or cyanosis noted. Neuro: Cranial Nerves II-XII grossly intact, except left face numbness and slight slurring of speech. Has 5/5 motor strength in upper and lower extremities, mild drift of left upper extremity.  Lab results:  Recent Labs  04/08/13 1510 04/08/13 1522  NA 139 141  K 4.4 4.1  CL 101 103  CO2 24  --   GLUCOSE 114* 114*  BUN 29* 29*  CREATININE 1.69* 1.90*  CALCIUM 8.8  --     Recent Labs  04/08/13 1510  AST 16  ALT 19  ALKPHOS 80  BILITOT 0.3  PROT 7.7  ALBUMIN 3.6   No results found for this basename: LIPASE, AMYLASE,  in the last 72 hours  Recent Labs  04/08/13 1510 04/08/13 1522  WBC 9.8  --   NEUTROABS 5.9  --   HGB 11.5* 11.9*  HCT 34.9* 35.0*  MCV 85.5  --   PLT 300  --     Recent Labs  04/08/13 1510  TROPONINI <0.30   No components found with this basename: POCBNP,  No results found for this basename: DDIMER,  in the last 72 hours No results found for this basename: HGBA1C,  in the last 72 hours No results  found for this basename: CHOL, HDL, LDLCALC, TRIG, CHOLHDL, LDLDIRECT,  in the last 72 hours No results found for this basename: TSH, T4TOTAL, FREET3, T3FREE, THYROIDAB,  in the last 72 hours No results found for this basename: VITAMINB12, FOLATE, FERRITIN, TIBC, IRON, RETICCTPCT,  in the last 72 hours Imaging results:  Ct Head Wo Contrast  04/08/2013   CLINICAL DATA:  Bilateral arm numbness.  EXAM: CT HEAD WITHOUT CONTRAST  TECHNIQUE: Contiguous axial images were obtained from the base of the skull through the vertex without intravenous contrast.  COMPARISON:  01/16/2013.  FINDINGS: No significant change in patchy white matter low density in both cerebral hemispheres and small areas of more focal low density. Stable old  left basal ganglia lacunar infarct or prominent perivascular space. The ventricles remain normal in size and position. No intracranial hemorrhage, mass lesion or CT evidence of acute infarction. Unremarkable bones and included paranasal sinuses.  IMPRESSION: 1. No acute abnormality. 2. Stable chronic small vessel white matter ischemic changes and old lacunar infarcts. These results were called by telephone at the time of interpretation on 04/08/2013 at 3:29 PM to Dr. Ezequiel Essex , who verbally acknowledged these results.   Electronically Signed   By: Enrique Sack M.D.   On: 04/08/2013 15:31   Mr Jodene Nam Head Wo Contrast  04/08/2013   CLINICAL DATA:  Previous left carotid endarterectomy. Chronic right carotid occlusion. Stroke.  EXAM: MRI HEAD WITHOUT CONTRAST  MRA HEAD WITHOUT CONTRAST  TECHNIQUE: Multiplanar, multiecho pulse sequences of the brain and surrounding structures were obtained without intravenous contrast. Angiographic images of the head were obtained using MRA technique without contrast.  COMPARISON:  Head CT same day.  CT angiography an MRI brain 2014  FINDINGS: MRI HEAD FINDINGS  Diffusion imaging shows a sub cm acute infarction within the left cerebellum. No other acute  infarction.  There chronic small-vessel ischemic changes within the pons. There are chronic small-vessel ischemic changes throughout the cerebral hemispheric white matter. There is old subcortical infarction in the right frontoparietal region and in the left parietal region. There is some hemosiderin deposition in those areas related to the old infarctions. No evidence of mass lesion, acute hemorrhage, hydrocephalus or extra-axial collection. No pituitary mass. No inflammatory sinus disease.  MRA HEAD FINDINGS  There is chronic right internal carotid artery occlusion. The left internal carotid artery is widely patent. There is some atherosclerotic irregularity in the carotid siphon region. The left internal carotid arteries supplies the left anterior and middle cerebral artery territories and through a patent anterior communicating artery, the right anterior and middle cerebral artery territories. Distal branch vessels do show some atherosclerotic irregularity.  The right vertebral artery is a large vessel widely patent to the basilar. There is reconstituted flow in the left vertebral artery. No basilar stenosis. Posterior circulation branch vessels are patent. No flow is seen in the right posterior cerebral artery. This is a new finding compared to previous examinations. However, no infarction is noted in the right PCA territory.  IMPRESSION: Sub cm acute infarction within the left cerebellum. No other acute infarction. Extensive old ischemic changes throughout the hemispheric white matter with watershed infarction pattern on the right.  Chronic right internal carotid artery occlusion. Anterior circulation supply occurs via a widely patent left internal carotid artery with patent anterior communicating artery.  Widely patent dominant right vertebral artery. Reconstituted flow in the left vertebral artery.  No flow demonstrated in the right posterior cerebral artery on today's study. This is new since the previous  exam. However, no infarction is seen in the right PCA territory as might be expected.   Electronically Signed   By: Nelson Chimes M.D.   On: 04/08/2013 19:58   Mr Brain Wo Contrast  04/08/2013   CLINICAL DATA:  Previous left carotid endarterectomy. Chronic right carotid occlusion. Stroke.  EXAM: MRI HEAD WITHOUT CONTRAST  MRA HEAD WITHOUT CONTRAST  TECHNIQUE: Multiplanar, multiecho pulse sequences of the brain and surrounding structures were obtained without intravenous contrast. Angiographic images of the head were obtained using MRA technique without contrast.  COMPARISON:  Head CT same day.  CT angiography an MRI brain 2014  FINDINGS: MRI HEAD FINDINGS  Diffusion imaging shows a sub cm acute  infarction within the left cerebellum. No other acute infarction.  There chronic small-vessel ischemic changes within the pons. There are chronic small-vessel ischemic changes throughout the cerebral hemispheric white matter. There is old subcortical infarction in the right frontoparietal region and in the left parietal region. There is some hemosiderin deposition in those areas related to the old infarctions. No evidence of mass lesion, acute hemorrhage, hydrocephalus or extra-axial collection. No pituitary mass. No inflammatory sinus disease.  MRA HEAD FINDINGS  There is chronic right internal carotid artery occlusion. The left internal carotid artery is widely patent. There is some atherosclerotic irregularity in the carotid siphon region. The left internal carotid arteries supplies the left anterior and middle cerebral artery territories and through a patent anterior communicating artery, the right anterior and middle cerebral artery territories. Distal branch vessels do show some atherosclerotic irregularity.  The right vertebral artery is a large vessel widely patent to the basilar. There is reconstituted flow in the left vertebral artery. No basilar stenosis. Posterior circulation branch vessels are patent. No flow is  seen in the right posterior cerebral artery. This is a new finding compared to previous examinations. However, no infarction is noted in the right PCA territory.  IMPRESSION: Sub cm acute infarction within the left cerebellum. No other acute infarction. Extensive old ischemic changes throughout the hemispheric white matter with watershed infarction pattern on the right.  Chronic right internal carotid artery occlusion. Anterior circulation supply occurs via a widely patent left internal carotid artery with patent anterior communicating artery.  Widely patent dominant right vertebral artery. Reconstituted flow in the left vertebral artery.  No flow demonstrated in the right posterior cerebral artery on today's study. This is new since the previous exam. However, no infarction is seen in the right PCA territory as might be expected.   Electronically Signed   By: Nelson Chimes M.D.   On: 04/08/2013 19:58   Other results: EKG: Sinus rhythm with heart rate of 95.  Assessment & Plan by Problem: Acute left ischemic cerebellar infarct in patient with previous bihemispheric watershed infarcts, left carotid artery stenosis status post carotid endarterectomy in August of 2014 Check carotid Dopplers and 2-D echocardiogram to complete stroke workup.  Requests physical therapy, occupational therapy, and speech therapy evaluation.  Continue aspirin.  Monitor on telemetry.  Neurology following, appreciate their input.  Check hemoglobin A1c and lipid panel to risk stratify the patient.  Malignant hypertension Blood pressure not well controlled, allow for permissive hypertension for the next 24 hours.  When necessary hydralazine for SBP greater than 200.  Further titration of antihypertensive medication that pending on patient's clinical course.  Chronic kidney disease stage III At baseline.  Continue to monitor.  Prediabetes Check hemoglobin A1c in the morning.  Sliding scale insulin, sensitive  scale.  Hyperlipidemia Continue statin.  Peripheral vascular disease Stable  Episode of nausea and vomiting with positive gastric occult No evidence of bleeding.  Continue to monitor.  Continue aspirin and Lovenox for now, discontinue the above if any signs of bleeding.  Prophylaxis Lovenox.  CODE STATUS Full code.  Disposition Admit the patient to telemetry as inpatient.  Time spent on admission, talking to the patient, and coordinating care was: 50 mins.  Tyjae Shvartsman A, MD 04/08/2013, 8:54 PM

## 2013-04-08 NOTE — ED Notes (Signed)
Tele neurology consult inprogress

## 2013-04-08 NOTE — ED Provider Notes (Signed)
CSN: [id number]     Arrival date & time 04/08/13  1500 History   First MD Initiated Contact with Patient 04/08/13 [phone]     Chief Complaint  Patient presents with  . Dizziness   (Consider location/radiation/quality/duration/timing/severity/associated sxs/prior Treatment) HPI Comments: Patient present with sudden onset of dizziness, numbness and tingling in her left arm, left mouth and tongue and left side of the face. This onset about 45 minutes ago while she was reading. Last seen normal at 2:30 PM. She denies any headache, nausea, vomiting, chest pain or shortness of breath. She's a history of previous CVA, diabetes, hyperlipidemia. Her blood pressure medication was changed 2 days ago from labetalol to atenolol because she was feeling weak. Denies any speech changes or visual changes. She denies any difficulty breathing or swallowing. With previous CVA in August, she had L sided weakness that resolved.  The history is provided by the patient and the spouse. The history is limited by the condition of the patient.    Past Medical History  Diagnosis Date  . Essential hypertension, benign   . Fibromyalgia   . Cerebral infarction     Bihemispheric watershed infarcts  . Mixed hyperlipidemia   . Borderline diabetes   . Urinary incontinence   . Carotid artery occlusion     Occluded RICA, status post left CEA  August 2014 - Dr. Donnetta Hutching  . CKD (chronic kidney disease) stage 3, GFR 30-59 ml/min    Past Surgical History  Procedure Laterality Date  . Urethral dilation  1980's  . Combined hysterectomy vaginal w/ mmk / a&p repair  1981  . Vaginal hysterectomy  1981    "partial" (10/04/2012)  . Endarterectomy Left 10/06/2012    Procedure: Carotid Endarterectomy with Finesse patch angioplasty;  Surgeon: [name], MD;  Location: [facility] OR;  Service: Vascular;  Laterality: Left;   Family History  Problem Relation Age of Onset  . Diabetes Mother   . Diabetes Father   . Hypertension Sister   . Diabetes  Brother   . Seizures Son   . Kidney disease Maternal Grandmother   . Hypertension Maternal Grandmother   . Heart disease Maternal Grandfather   . Diabetes Paternal Grandmother   . Heart disease Paternal Grandfather    History  Substance Use Topics  . Smoking status: Former Smoker -- 1.00 packs/day for 44 years    Types: Cigarettes    Quit date: 10/03/2012  . Smokeless tobacco: Never Used  . Alcohol Use: No   OB History   Grav Para Term Preterm Abortions TAB SAB Ect Mult Living                 Review of Systems  Constitutional: Positive for activity change and appetite change.  Respiratory: Negative for cough, chest tightness and shortness of breath.   Cardiovascular: Negative for chest pain.  Gastrointestinal: Negative for nausea, vomiting and abdominal pain.  Genitourinary: Negative for vaginal bleeding and vaginal discharge.  Musculoskeletal: Negative for back pain.  Skin: Negative for rash.  Neurological: Positive for dizziness, light-headedness and numbness. Negative for headaches.  A complete 10 system review of systems was obtained and all systems are negative except as noted in the HPI and PMH.    Allergies  Hydrochlorothiazide  Home Medications   Current Outpatient Rx  Name  Route  Sig  Dispense  Refill  . amLODipine (NORVASC) 10 MG tablet   Oral   Take 10 mg by mouth daily.         Marland Kitchen  aspirin EC 81 MG tablet   Oral   Take 81 mg by mouth every morning.         Marland Kitchen atenolol (TENORMIN) 25 MG tablet   Oral   Take 1 tablet (25 mg total) by mouth daily.   180 tablet   3   . clonazePAM (KLONOPIN) 0.5 MG tablet   Oral   Take 0.5 mg by mouth 2 (two) times daily as needed for anxiety.         . cloNIDine (CATAPRES - DOSED IN MG/24 HR) 0.3 mg/24hr patch   Transdermal   Place 0.3 mg onto the skin once a week. On Tuesday         . hydrALAZINE (APRESOLINE) 50 MG tablet   Oral   Take 1 tablet (50 mg total) by mouth 2 (two) times daily before a meal.    270 tablet   3   . simvastatin (ZOCOR) 10 MG tablet   Oral   Take 1 tablet (10 mg total) by mouth daily at 6 PM.   30 tablet   1   . tolterodine (DETROL LA) 4 MG 24 hr capsule   Oral   Take 1 capsule (4 mg total) by mouth daily.   30 capsule   3    BP 168/64  Pulse 92  Temp(Src) 98.5 F (36.9 C)  Resp 14  Ht 5\' 5"  (1.651 m)  Wt 215 lb (97.523 kg)  BMI 35.78 kg/m2  SpO2 94% Physical Exam  Constitutional: She is oriented to person, place, and time. She appears well-developed and well-nourished. No distress.  HENT:  Head: Normocephalic and atraumatic.  Mouth/Throat: Oropharynx is clear and moist. No oropharyngeal exudate.  Eyes: Conjunctivae and EOM are normal. Pupils are equal, round, and reactive to light.  Neck: Normal range of motion. Neck supple.  Cardiovascular: Normal rate, regular rhythm and normal heart sounds.   Pulmonary/Chest: Effort normal and breath sounds normal. No respiratory distress.  Abdominal: Soft. There is no tenderness. There is no rebound and no guarding.  Musculoskeletal: Normal range of motion. She exhibits no edema and no tenderness.  Neurological: She is alert and oriented to person, place, and time. No cranial nerve deficit. She exhibits normal muscle tone. Coordination normal.  Cranial nerves 2-12 are intact, slightly decreased grip strength on the left, slight pronator drift. 5/5 strength in the lower extremities.  Visual fields full to confrontation. Decreased sensation in L face and L arm  Skin: Skin is warm.  Psychiatric: She has a normal mood and affect.    ED Course  Procedures (including critical care time) Labs Review Labs Reviewed  CBC - Abnormal; Notable for the following:    Hemoglobin 11.5 (*)    HCT 34.9 (*)    All other components within normal limits  COMPREHENSIVE METABOLIC PANEL - Abnormal; Notable for the following:    Glucose, Bld 114 (*)    BUN 29 (*)    Creatinine, Ser 1.69 (*)    GFR calc non Af Amer 31 (*)     GFR calc Af Amer 36 (*)    All other components within normal limits  URINALYSIS, ROUTINE W REFLEX MICROSCOPIC - Abnormal; Notable for the following:    Protein, ur 100 (*)    All other components within normal limits  GLUCOSE, CAPILLARY - Abnormal; Notable for the following:    Glucose-Capillary 106 (*)    All other components within normal limits  OCCULT BLOOD GASTRIC / DUODENUM (SPECIMEN CUP) -  Abnormal; Notable for the following:    Occult Blood, Gastric POSITIVE (*)    All other components within normal limits  POCT I-STAT, CHEM 8 - Abnormal; Notable for the following:    BUN 29 (*)    Creatinine, Ser 1.90 (*)    Glucose, Bld 114 (*)    Hemoglobin 11.9 (*)    HCT 35.0 (*)    All other components within normal limits  ETHANOL  PROTIME-INR  APTT  DIFFERENTIAL  TROPONIN I  URINE RAPID DRUG SCREEN (HOSP PERFORMED)  URINE MICROSCOPIC-ADD ON  POCT I-STAT TROPONIN I   Imaging Review Ct Head Wo Contrast  04/08/2013   CLINICAL DATA:  Bilateral arm numbness.  EXAM: CT HEAD WITHOUT CONTRAST  TECHNIQUE: Contiguous axial images were obtained from the base of the skull through the vertex without intravenous contrast.  COMPARISON:  01/16/2013.  FINDINGS: No significant change in patchy white matter low density in both cerebral hemispheres and small areas of more focal low density. Stable old left basal ganglia lacunar infarct or prominent perivascular space. The ventricles remain normal in size and position. No intracranial hemorrhage, mass lesion or CT evidence of acute infarction. Unremarkable bones and included paranasal sinuses.  IMPRESSION: 1. No acute abnormality. 2. Stable chronic small vessel white matter ischemic changes and old lacunar infarcts. These results were called by telephone at the time of interpretation on 04/08/2013 at 3:29 PM to Dr. Ezequiel [place] , who verbally acknowledged these results.   Electronically Signed   By: Enrique Sack M.D.   On: 04/08/2013 15:31    EKG  Interpretation    Date/Time:  Sunday April 08 2013 15:30:53 EST Ventricular Rate:  95 PR Interval:  180 QRS Duration: 78 QT Interval:  360 QTC Calculation: 452 R Axis:   -6 Text Interpretation:  Normal sinus rhythm Septal infarct (cited on or before 03-Oct-2012) Abnormal ECG When compared with ECG of 18-Jan-2013 06:26, Questionable change in initial forces of Septal leads No significant change was found Confirmed by Wyvonnia Dusky  MD, Ian Castagna ([id number]) on 04/08/2013 3:33:51 PM            MDM   1. CVA (cerebral infarction)   2. Acute ischemic stroke   3. CKD (chronic kidney disease) stage 3, GFR 30-59 ml/min   4. Diplopia   5. Left-sided weakness   6. Paresthesias in left hand    Acute onset of dizziness, L arm numbness and weakness, L facial numbness, onset 1430.   Code stroke called given symptoms and risk factors.  CT head negative. D./w Dr, Nicole Kindred who agrees that deficits appear too minimal to warrant tPA at this time.   Teleneurologist agrees with no tPA at this time as NIH scale is 2.  Upon Dr. Maralyn Sago assessment, patient had worsening dizziness and double vision with nausea.  D/w Dr. Geraldine Solar Neurologist again who recommended CTA head and neck.  [name] creatinine is 1.9 and precludes use of IV contrast. This is discussed with Dr. Nicole Kindred at [facility] who agrees with MRI/MRA to be obtained at [facility]. Code stroke remains activated.   [name] has ongoing vertigo with nausea and vomiting. She has dark-colored emesis which is Hemoccult positive. Her strength in the left arm and leg appear to be about the same. No worsening of her motor deficits. She has no nystagmus or visual field deficits. With hemoccult positive emesis, minimal neuro deficits, she is not a tPA candidate. D/w Dr. Nicole Kindred who agrees.   D./w Dr. Nicole Kindred and Dr Aline Brochure at  [place].  CRITICAL CARE Performed by: Ezequiel Essex Total critical care time: 45 Critical care time was exclusive of separately  billable procedures and treating other patients. Critical care was necessary to treat or prevent imminent or life-threatening deterioration. Critical care was time spent personally by me on the following activities: development of treatment plan with patient and/or surrogate as well as nursing, discussions with consultants, evaluation of patient's response to treatment, examination of patient, obtaining history from patient or surrogate, ordering and performing treatments and interventions, ordering and review of laboratory studies, ordering and review of radiographic studies, pulse oximetry and re-evaluation of patient's condition.      Ezequiel [place], MD 04/08/13 [phone]

## 2013-04-08 NOTE — ED Notes (Signed)
Pt reports 43min ago was sitting down reading and suddenly became dizzy and left arm feels numb, and mouth feels numb.  Denies any headache, n/v.  Reports saw Dr. Domenic Polite and changed a bp medication  Friday.  Pt says started taking atenolol yesterday.    Reports history of stroke in August.

## 2013-04-08 NOTE — Consult Note (Signed)
Referring Physician: Dr. Wyvonnia Dusky    Chief Complaint: Vertigo, left face and arm numbness, arm weakness and nausea and vomiting.,   HPI: Kelli [name] is an 63 y.o. female a history of hypertension, hyperlipidemia, watershed cerebral infarctions, right ICA occlusion and left CEA, presenting with new onset vertigo with nausea and vomiting as well as numbness involving left side of face and left upper extremity and left upper extremity weakness. Onset of symptoms was at 2:30 PM today. Patient has been taking aspirin 81 mg per day. She presented to the emergency room at [facility] initially. She was noted initially deemed a candidate for TPA. However, she became worse with increasing nausea and vomiting as well as vertigo and worsening of numbness and left-sided weakness. Speech also became somewhat slurred. CTA was recommended by [facility] neurologist. Study could not be done however because of patient's elevated creatinine. She was subsequently transferred here for MRI and MRA study to rule out basal artery thrombosis. MRI showed findings consistent with acute small left cerebellar ischemic infarction. There is no evidence of basilar artery occlusion or significant stenosis. No evidence of thrombus was seen. NIH stroke score on arrival here was 5. Patient had dark colored emesis which was guaiac positive.  LSN: [id number] on 04/08/2013 tPA Given: No: guaiac positive emesis; mild deficits MRankin: 2  Past Medical History  Diagnosis Date  . Essential hypertension, benign   . Fibromyalgia   . Cerebral infarction     Bihemispheric watershed infarcts  . Mixed hyperlipidemia   . Borderline diabetes   . Urinary incontinence   . Carotid artery occlusion     Occluded RICA, status post left CEA  August 2014 - Dr. Donnetta Hutching  . CKD (chronic kidney disease) stage 3, GFR 30-59 ml/min     Family History  Problem Relation Age of Onset  . Diabetes Mother   . Diabetes Father   . Hypertension Sister    . Diabetes Brother   . Seizures Son   . Kidney disease Maternal Grandmother   . Hypertension Maternal Grandmother   . Heart disease Maternal Grandfather   . Diabetes Paternal Grandmother   . Heart disease Paternal Grandfather      Medications: I have reviewed the patient's current medications.  ROS: History obtained from the patient  General ROS: negative for - chills, fatigue, fever, night sweats, weight gain or weight loss Psychological ROS: negative for - behavioral disorder, hallucinations, memory difficulties, mood swings or suicidal ideation Ophthalmic ROS: negative for - blurry vision, double vision, eye pain or loss of vision ENT ROS: negative for - epistaxis, nasal discharge, oral lesions, sore throat, tinnitus or vertigo Allergy and Immunology ROS: negative for - hives or itchy/watery eyes Hematological and Lymphatic ROS: negative for - bleeding problems, bruising or swollen lymph nodes Endocrine ROS: negative for - galactorrhea, hair pattern changes, polydipsia/polyuria or temperature intolerance Respiratory ROS: negative for - cough, hemoptysis, shortness of breath or wheezing Cardiovascular ROS: negative for - chest pain, dyspnea on exertion, edema or irregular heartbeat Gastrointestinal ROS: negative for - abdominal pain, diarrhea, hematemesis, nausea/vomiting or stool incontinence Genito-Urinary ROS: negative for - dysuria, hematuria, incontinence or urinary frequency/urgency Musculoskeletal ROS: negative for - joint swelling or muscular weakness Neurological ROS: as noted in HPI Dermatological ROS: negative for rash and skin lesion changes  Physical Examination: Blood pressure 168/64, pulse 92, temperature 98.5 F (36.9 C), resp. rate 14, height 5\' 5"  (1.651 m), weight 97.523 kg (215 lb), SpO2 94.00%.  Neurologic Examination: Mental  Status: Lethargic (given Phenergan), oriented, thought content appropriate.  Speech slurred without evidence of aphasia. Able to  follow commands. Cranial Nerves: II-Visual fields were normal. III/IV/VI-Pupils were equal and reacted. Extraocular movements were full and conjugate, no nystagmus.    V/VII-no facial numbness; mild left lower facial weakness. VIII-normal. X-slightly slurred. Motor: mild drift of LUE; otherwise unremarkable. Sensory: Normal throughout. Deep Tendon Reflexes: 2+ and symmetric. Plantars: mutebilaterally Cerebellar: Normal finger-to-nose testing on the right; impaired on left.  Ct Head Wo Contrast  04/08/2013   CLINICAL DATA:  Bilateral arm numbness.  EXAM: CT HEAD WITHOUT CONTRAST  TECHNIQUE: Contiguous axial images were obtained from the base of the skull through the vertex without intravenous contrast.  COMPARISON:  01/16/2013.  FINDINGS: No significant change in patchy white matter low density in both cerebral hemispheres and small areas of more focal low density. Stable old left basal ganglia lacunar infarct or prominent perivascular space. The ventricles remain normal in size and position. No intracranial hemorrhage, mass lesion or CT evidence of acute infarction. Unremarkable bones and included paranasal sinuses.  IMPRESSION: 1. No acute abnormality. 2. Stable chronic small vessel white matter ischemic changes and old lacunar infarcts. These results were called by telephone at the time of interpretation on 04/08/2013 at 3:29 PM to Dr. Ezequiel [place] , who verbally acknowledged these results.   Electronically Signed   By: Enrique Sack M.D.   On: 04/08/2013 15:31    Assessment: 63 y.o. female with multiple risk factors for stroke as well as previous stroke presenting with acute left ischemic cerebellar infarction.  Stroke Risk Factors - hyperlipidemia and hypertension  Plan: 1. HgbA1c, fasting lipid panel 2. PT consult, OT consult, Speech consult 3. Prophylactic therapy-None 4. Risk factor modification 5. Telemetry monitoring   C.R. Nicole Kindred, MD [facility] [phone]  04/08/2013, 6:49 PM

## 2013-04-08 NOTE — Progress Notes (Signed)
Pt arrived to unit via stretcher, lethargic, but arousable, voided on bed pan, than vomited, notified NP. Zofran given. Still too lethargic to give po meds safely. Suppository aspirin given. Pt placed on tele -ST.

## 2013-04-08 NOTE — ED Notes (Signed)
Report called to [facility] ED.

## 2013-04-08 NOTE — ED Notes (Signed)
Active vomiting in room.

## 2013-04-08 NOTE — ED Notes (Signed)
Patient left ED at this time with [facility].

## 2013-04-08 NOTE — ED Notes (Signed)
Patient actively vomiting. MD aware. Phenergan 12.5 mg verbal order obtained.

## 2013-04-08 NOTE — ED Notes (Signed)
ED Physician at the bedside.

## 2013-04-08 NOTE — ED Notes (Signed)
Assisted patient with Bedside commode. Stand by assist. Patient tolerated well. Urine specimen obtained and sent to lab.

## 2013-04-08 NOTE — ED Provider Notes (Signed)
CSN: [id number]     Arrival date & time 04/08/13  1500 History   First MD Initiated Contact with Patient 04/08/13 [phone]     Chief Complaint  Patient presents with  . Dizziness   (Consider location/radiation/quality/duration/timing/severity/associated sxs/prior Treatment) HPI Comments: 63 yo wf presents from transfer from [place] to [facility] ER as a Code Stroke. Pt accepted by Neurology Dr. Nicole Kindred.  On arrival to [facility] she went directly to MRI suite. Upon completion of MRI pt was transferred to ER and met by myself and Dr. Leonel Ramsay.    History obtained from Husband and son and review of ER note at [facility] and neurology note.      Patient is a 62 y.o. female presenting with dizziness. The history is provided by the patient, the spouse and the EMS personnel.  Dizziness Quality:  Head spinning Severity:  Moderate Onset quality:  Gradual Timing:  Constant Chronicity:  New Relieved by:  Nothing Worsened by:  Nothing tried   Past Medical History  Diagnosis Date  . Essential hypertension, benign   . Fibromyalgia   . Cerebral infarction     Bihemispheric watershed infarcts  . Mixed hyperlipidemia   . Borderline diabetes   . Urinary incontinence   . Carotid artery occlusion     Occluded RICA, status post left CEA  August 2014 - Dr. Donnetta Hutching  . CKD (chronic kidney disease) stage 3, GFR 30-59 ml/min    Past Surgical History  Procedure Laterality Date  . Urethral dilation  1980's  . Combined hysterectomy vaginal w/ mmk / a&p repair  1981  . Vaginal hysterectomy  1981    "partial" (10/04/2012)  . Endarterectomy Left 10/06/2012    Procedure: Carotid Endarterectomy with Finesse patch angioplasty;  Surgeon: [name], MD;  Location: [facility] OR;  Service: Vascular;  Laterality: Left;   Family History  Problem Relation Age of Onset  . Diabetes Mother   . Diabetes Father   . Hypertension Sister   . Diabetes Brother   . Seizures Son   . Kidney disease Maternal Grandmother   .  Hypertension Maternal Grandmother   . Heart disease Maternal Grandfather   . Diabetes Paternal Grandmother   . Heart disease Paternal Grandfather    History  Substance Use Topics  . Smoking status: Former Smoker -- 1.00 packs/day for 44 years    Types: Cigarettes    Quit date: 10/03/2012  . Smokeless tobacco: Never Used  . Alcohol Use: No   OB History   Grav Para Term Preterm Abortions TAB SAB Ect Mult Living                 Review of Systems  Constitutional: Positive for activity change.  HENT: Negative.   Eyes: Positive for visual disturbance.  Respiratory: Negative.   Cardiovascular: Negative.   Gastrointestinal: Negative.   Endocrine: Negative.   Genitourinary: Negative.   Musculoskeletal: Negative.   Neurological: Positive for dizziness.  Hematological: Negative.   All other systems reviewed and are negative.    Allergies  Hydrochlorothiazide  Home Medications   Current Outpatient Rx  Name  Route  Sig  Dispense  Refill  . amLODipine (NORVASC) 10 MG tablet   Oral   Take 10 mg by mouth daily.         Marland Kitchen aspirin EC 81 MG tablet   Oral   Take 81 mg by mouth every morning.         Marland Kitchen atenolol (TENORMIN) 25 MG tablet  Oral   Take 1 tablet (25 mg total) by mouth daily.   180 tablet   3   . clonazePAM (KLONOPIN) 0.5 MG tablet   Oral   Take 0.5 mg by mouth 2 (two) times daily as needed for anxiety.         . cloNIDine (CATAPRES - DOSED IN MG/24 HR) 0.3 mg/24hr patch   Transdermal   Place 0.3 mg onto the skin once a week. On Tuesday         . hydrALAZINE (APRESOLINE) 50 MG tablet   Oral   Take 1 tablet (50 mg total) by mouth 2 (two) times daily before a meal.   270 tablet   3   . simvastatin (ZOCOR) 10 MG tablet   Oral   Take 1 tablet (10 mg total) by mouth daily at 6 PM.   30 tablet   1   . tolterodine (DETROL LA) 4 MG 24 hr capsule   Oral   Take 1 capsule (4 mg total) by mouth daily.   30 capsule   3    BP 168/64  Pulse 92   Temp(Src) 98.5 F (36.9 C)  Resp 14  Ht 5' 5"  (1.651 m)  Wt 215 lb (97.523 kg)  BMI 35.78 kg/m2  SpO2 94% Physical Exam  Nursing note and vitals reviewed. Constitutional: She appears well-developed and well-nourished. She appears lethargic. She is easily aroused. No distress.  HENT:  Head: Normocephalic and atraumatic.  Eyes: EOM are normal. Right eye exhibits no discharge. Left eye exhibits no discharge.  Neck: Trachea normal and normal range of motion. Neck supple.    Cardiovascular: Normal rate and regular rhythm.   Pulmonary/Chest: Effort normal and breath sounds normal.  Musculoskeletal: Normal range of motion. She exhibits no edema and no tenderness.  Neurological: She is easily aroused. She appears lethargic. No cranial nerve deficit. She exhibits normal muscle tone. GCS eye subscore is 4. GCS verbal subscore is 4. GCS motor subscore is 4.  Very sleepy, but awakes on verbal stimulation;  Follows commands  Skin: She is not diaphoretic.    ED Course  Procedures (including critical care time) Labs Review Labs Reviewed  CBC - Abnormal; Notable for the following:    Hemoglobin 11.5 (*)    HCT 34.9 (*)    All other components within normal limits  COMPREHENSIVE METABOLIC PANEL - Abnormal; Notable for the following:    Glucose, Bld 114 (*)    BUN 29 (*)    Creatinine, Ser 1.69 (*)    GFR calc non Af Amer 31 (*)    GFR calc Af Amer 36 (*)    All other components within normal limits  URINALYSIS, ROUTINE W REFLEX MICROSCOPIC - Abnormal; Notable for the following:    Protein, ur 100 (*)    All other components within normal limits  GLUCOSE, CAPILLARY - Abnormal; Notable for the following:    Glucose-Capillary 106 (*)    All other components within normal limits  OCCULT BLOOD GASTRIC / DUODENUM (SPECIMEN CUP) - Abnormal; Notable for the following:    Occult Blood, Gastric POSITIVE (*)    All other components within normal limits  POCT I-STAT, CHEM 8 - Abnormal; Notable  for the following:    BUN 29 (*)    Creatinine, Ser 1.90 (*)    Glucose, Bld 114 (*)    Hemoglobin 11.9 (*)    HCT 35.0 (*)    All other components within normal limits  ETHANOL  PROTIME-INR  APTT  DIFFERENTIAL  TROPONIN I  URINE RAPID DRUG SCREEN (HOSP PERFORMED)  URINE MICROSCOPIC-ADD ON  POCT I-STAT TROPONIN I   Imaging Review Ct Head Wo Contrast  04/08/2013   CLINICAL DATA:  Bilateral arm numbness.  EXAM: CT HEAD WITHOUT CONTRAST  TECHNIQUE: Contiguous axial images were obtained from the base of the skull through the vertex without intravenous contrast.  COMPARISON:  01/16/2013.  FINDINGS: No significant change in patchy white matter low density in both cerebral hemispheres and small areas of more focal low density. Stable old left basal ganglia lacunar infarct or prominent perivascular space. The ventricles remain normal in size and position. No intracranial hemorrhage, mass lesion or CT evidence of acute infarction. Unremarkable bones and included paranasal sinuses.  IMPRESSION: 1. No acute abnormality. 2. Stable chronic small vessel white matter ischemic changes and old lacunar infarcts. These results were called by telephone at the time of interpretation on 04/08/2013 at 3:29 PM to Dr. Ezequiel Essex , who verbally acknowledged these results.   Electronically Signed   By: Enrique Sack M.D.   On: 04/08/2013 15:31    EKG Interpretation    Date/Time:  Sunday April 08 2013 15:30:53 EST Ventricular Rate:  95 PR Interval:  180 QRS Duration: 78 QT Interval:  360 QTC Calculation: 452 R Axis:   -6 Text Interpretation:  Normal sinus rhythm Septal infarct (cited on or before 03-Oct-2012) Abnormal ECG When compared with ECG of 18-Jan-2013 06:26, Questionable change in initial forces of Septal leads No significant change was found Confirmed by Wyvonnia Dusky  MD, STEPHEN ([id number]) on 04/08/2013 3:33:51 PM            MDM   1. CVA (cerebral infarction)   2. Acute ischemic stroke   3. CKD  (chronic kidney disease) stage 3, GFR 30-59 ml/min   4. Diplopia   5. Left-sided weakness   6. Paresthesias in left hand   7. Cerebellar stroke   8. Hypertension, uncontrolled   9. Vertigo    63 yo female with Stroke.  Seen by neuro on arrival.  Very sleepy, but easily arousable and follows commands.  VSS.  Neuro is ok with BPs up to 210/120.  Plan for admit to medicine tele.    7:52 PM paged medicine.  Case d/w Dr. Reece Levy.  Plan for admit.  Family updated.    Elmer Sow, MD 04/08/13 2017

## 2013-04-08 NOTE — Code Documentation (Signed)
Patient arrived to [facility] ED via [facility] from [facility].  Code stroke called at 1515.  Upon arrival to [facility] patient vomiting in truck.  Patient is lethargic, was given phenergan.  NIHSS 5 LSN at 1430, with left side weakness, dizziness, numbness and tingling in left arm. Patient brought to MRI shortly after arrival to [facility]

## 2013-04-08 NOTE — ED Notes (Signed)
Pt still in MRI at this time

## 2013-04-09 ENCOUNTER — Encounter (HOSPITAL_COMMUNITY): Payer: Self-pay | Admitting: Nurse Practitioner

## 2013-04-09 DIAGNOSIS — R7309 Other abnormal glucose: Secondary | ICD-10-CM

## 2013-04-09 DIAGNOSIS — F17200 Nicotine dependence, unspecified, uncomplicated: Secondary | ICD-10-CM

## 2013-04-09 LAB — LIPID PANEL
CHOL/HDL RATIO: 5.2 ratio
CHOLESTEROL: 171 mg/dL (ref 0–200)
HDL: 33 mg/dL — ABNORMAL LOW (ref >39)
LDL Cholesterol: 90 mg/dL (ref 0–99)
Triglycerides: 241 mg/dL — ABNORMAL HIGH (ref <150)
VLDL: 48 mg/dL — ABNORMAL HIGH (ref 0–40)

## 2013-04-09 LAB — CBC
HEMATOCRIT: 33.9 % — AB (ref 36.0–46.0)
Hemoglobin: 11.1 g/dL — ABNORMAL LOW (ref 12.0–15.0)
MCH: 28 pg (ref 26.0–34.0)
MCHC: 32.7 g/dL (ref 30.0–36.0)
MCV: 85.4 fL (ref 78.0–100.0)
PLATELETS: 269 10*3/uL (ref 150–400)
RBC: 3.97 MIL/uL (ref 3.87–5.11)
RDW: 13.8 % (ref 11.5–15.5)
WBC: 9.5 10*3/uL (ref 4.0–10.5)

## 2013-04-09 LAB — GLUCOSE, CAPILLARY
GLUCOSE-CAPILLARY: 157 mg/dL — AB (ref 70–99)
Glucose-Capillary: 148 mg/dL — ABNORMAL HIGH (ref 70–99)
Glucose-Capillary: 135 mg/dL — ABNORMAL HIGH (ref 70–99)

## 2013-04-09 LAB — BASIC METABOLIC PANEL
BUN: 23 mg/dL (ref 6–23)
CHLORIDE: 103 meq/L (ref 96–112)
CO2: 25 meq/L (ref 19–32)
CREATININE: 1.4 mg/dL — AB (ref 0.50–1.10)
Calcium: 8.8 mg/dL (ref 8.4–10.5)
GFR calc Af Amer: 46 mL/min — ABNORMAL LOW (ref >90)
GFR calc non Af Amer: 39 mL/min — ABNORMAL LOW (ref >90)
GLUCOSE: 134 mg/dL — AB (ref 70–99)
Potassium: 3.7 mEq/L (ref 3.7–5.3)
Sodium: 143 mEq/L (ref 137–147)

## 2013-04-09 LAB — HEMOGLOBIN A1C
Hgb A1c MFr Bld: 6.4 % — ABNORMAL HIGH (ref <5.7)
Mean Plasma Glucose: 137 mg/dL — ABNORMAL HIGH (ref <117)

## 2013-04-09 MED ORDER — STUDY - INVESTIGATIONAL MEDICATION: 2.0000 | Freq: Once | Status: AC

## 2013-04-09 MED ORDER — STUDY - INVESTIGATIONAL MEDICATION
1.0000 | Freq: Two times a day (BID) | Status: DC
  Administered 2013-04-09: 2 via ORAL
  Administered 2013-04-09 – 2013-04-10 (×3): 1 via ORAL
  Filled 2013-04-09 (×13): qty 1

## 2013-04-09 MED ORDER — STUDY - INVESTIGATIONAL MEDICATION
1.0000 | Freq: Every day | Status: DC
  Administered 2013-04-10: 1 via ORAL
  Filled 2013-04-09 (×3): qty 1

## 2013-04-09 MED ORDER — PROCHLORPERAZINE EDISYLATE 5 MG/ML IJ SOLN
10.0000 mg | Freq: Four times a day (QID) | INTRAMUSCULAR | Status: DC | PRN
  Filled 2013-04-09: qty 2

## 2013-04-09 MED ORDER — SODIUM CHLORIDE 0.9 % IV SOLN
INTRAVENOUS | Status: DC
  Administered 2013-04-09: 17:00:00 via INTRAVENOUS
  Administered 2013-04-09: 10 mL/h via INTRAVENOUS
  Administered 2013-04-10: 13:00:00 via INTRAVENOUS

## 2013-04-09 MED ORDER — ISOSORB DINITRATE-HYDRALAZINE 20-37.5 MG PO TABS
1.0000 | ORAL_TABLET | Freq: Three times a day (TID) | ORAL | Status: DC
  Administered 2013-04-09 (×2): 1 via ORAL
  Filled 2013-04-09 (×6): qty 1

## 2013-04-09 MED ORDER — STUDY - INVESTIGATIONAL MEDICATION
3.0000 | Freq: Once | Status: AC
  Administered 2013-04-09: 3 via ORAL
  Filled 2013-04-09: qty 1

## 2013-04-09 NOTE — Evaluation (Signed)
Speech Language Pathology Evaluation Patient Details Name: Kelli [name] MRN: [id number] DOB: [birth year] Today's Date: 04/09/2013 Time: 1600-1630 SLP Time Calculation (min): 30 min  Problem List:  Patient Active Problem List   Diagnosis Date Noted  . CVA (cerebral infarction) 04/08/2013  . Acute ischemic stroke 04/08/2013  . Diplopia 04/08/2013  . Left-sided weakness 04/08/2013  . Vertigo 04/08/2013  . Paresthesias in left hand 04/08/2013  . Pre-diabetes 04/08/2013  . Cerebellar stroke 04/08/2013  . Dizziness and giddiness 04/08/2013  . Stroke 04/08/2013  . Depression 04/06/2013  . Peripheral arterial disease 03/16/2013  . History of stroke 01/30/2013  . Precordial pain 01/16/2013  . CKD (chronic kidney disease) stage 3, GFR 30-59 ml/min 01/16/2013  . Anxiety state, unspecified 12/08/2012  . Lumbago 12/08/2012  . Urinary frequency 11/14/2012  . Occlusion and stenosis of carotid artery with cerebral infarction 10/05/2012  . Mixed hyperlipidemia 10/05/2012  . Renal insufficiency 10/05/2012  . Hypertension, uncontrolled 10/04/2012  . Tobacco abuse 10/04/2012  . Obesity 10/04/2012   Past Medical History:  Past Medical History  Diagnosis Date  . Essential hypertension, benign   . Fibromyalgia   . Cerebral infarction Aug 2014    Bihemispheric watershed infarcts  . Mixed hyperlipidemia   . Borderline diabetes   . Urinary incontinence   . Carotid artery occlusion     Occluded RICA, status post left CEA  August 2014 - Dr. Donnetta Hutching  . CKD (chronic kidney disease) stage 3, GFR 30-59 ml/min   . 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 Urethral dilation  1980's  . Combined hysterectomy vaginal w/ mmk / a&p repair  1981  . Vaginal hysterectomy  1981    "partial" (10/04/2012)  . Endarterectomy Left 10/06/2012    Procedure: Carotid Endarterectomy with  Finesse patch angioplasty;  Surgeon: [name], MD;  Location: [place];  Service: Vascular;  Laterality: Left;   HPI:  Kelli [name] is an 63 y.o. female a history of hypertension, hyperlipidemia, watershed cerebral infarctions, right ICA occlusion and left CEA, presenting with new onset vertigo with nausea and vomiting as well as numbness involving left side of face and left upper extremity and left upper extremity weakness. Onset of symptoms was at 2:30 PM today 04/08/2013. Patient has been taking aspirin 81 mg per day. She presented to the emergency room at [facility] initially. She was noted initially deemed a candidate for TPA. However, she became worse with increasing nausea and vomiting as well as vertigo and worsening of numbness and left-sided weakness. Speech also became somewhat slurred. CTA was recommended by Specialist on-Call neurologist. Study could not be done however because of patient's elevated creatinine. She was subsequently transferred here for MRI and MRA study to rule out basal artery thrombosis. MRI showed findings consistent with acute small left cerebellar ischemic infarction. There is no evidence of basilar artery occlusion or significant stenosis. No evidence of thrombus was seen. NIH stroke score on arrival to [facility] was 5. Patient had dark colored emesis which was guaiac positive. Patient was not administerd TPA.   Assessment / Plan / Recommendation Clinical Impression  Pt presents with mild impairments with sustained attention, basic problem solving, intellectual awareness, and recall of new information - all of which may be impacted by pt's level of fatigue. Speech is intelligible although with mildly imprecise articulation. Pt will benefit from  skilled SLP services to address the areas above to maximize cognitive function and functional communication. Also recommend clinical swallowing evaluation as pt was observed to cough with thin liquid via straw, and reports that  this has been happening more since she has been in the hospital. MD made aware.    SLP Assessment  Patient needs continued Speech Lanaguage Pathology Services    Follow Up Recommendations  Inpatient Rehab    Frequency and Duration min 2x/week  2 weeks   Pertinent Vitals/Pain N/A   SLP Goals  SLP Goals Potential to Achieve Goals: Good Progress/Goals/Alternative treatment plan discussed with pt/caregiver and they: Agree  SLP Evaluation Prior Functioning  Cognitive/Linguistic Baseline: Within functional limits (note h/o CVA with CIR admission in 2014) Type of Home: House  Lives With: Spouse Available Help at Discharge: Family   Cognition  Overall Cognitive Status: Impaired/Different from baseline Arousal/Alertness: Lethargic Orientation Level: Oriented to person;Oriented to place;Oriented to situation Attention: Sustained Sustained Attention: Impaired Sustained Attention Impairment: Verbal basic;Functional basic Memory: Impaired Memory Impairment: Decreased recall of new information Awareness: Impaired Awareness Impairment: Intellectual impairment Problem Solving: Impaired Problem Solving Impairment: Verbal basic Safety/Judgment: Impaired    Comprehension  Auditory Comprehension Overall Auditory Comprehension: Appears within functional limits for tasks assessed Visual Recognition/Discrimination Discrimination: Not tested Reading Comprehension Reading Status: Not tested    Expression Expression Primary Mode of Expression: Verbal Verbal Expression Overall Verbal Expression: Appears within functional limits for tasks assessed Written Expression Written Expression: Not tested   Oral / Motor Oral Motor/Sensory Function Overall Oral Motor/Sensory Function: Impaired Labial ROM: Reduced left Labial Symmetry: Abnormal symmetry left Lingual ROM: Within Functional Limits Lingual Symmetry: Abnormal symmetry left Mandible: Within Functional Limits Motor Speech Overall  Motor Speech: Impaired Respiration: Within functional limits Phonation: Normal Resonance: Within functional limits Articulation: Impaired Level of Impairment: Conversation Intelligibility: Intelligible Motor Planning: Witnin functional limits Motor Speech Errors: Not applicable   GO      Germain Osgood, M.A. CCC-SLP [phone]  Germain Osgood 04/09/2013, 4:53 PM

## 2013-04-09 NOTE — Progress Notes (Signed)
   CARE MANAGEMENT NOTE 04/09/2013  Patient:  Kelli [name], Kelli [name]   Account Number:  [id number]  Date Initiated:  04/09/2013  Documentation initiated by:  Olga Coaster  Subjective/Objective Assessment:   ADMITTED FOR CVA WORKUP     Action/Plan:   CM FOLLOWING FOR DCP   Anticipated DC Date:  04/13/2013   Anticipated DC Plan:  AWAITING ON PT/OT EVALS FOR DISPOSITION NEEDS WITH ATTENDING MD IN AGREEMENT     DC Planning Services  CM consult          Status of service:  In process, will continue to follow Medicare Important Message given?  NA - LOS <3 / Initial given by admissions (If response is "NO", the following Medicare IM given date fields will be blank)  Per UR Regulation:  Reviewed for med. necessity/level of care/duration of stay  Comments:  04/09/2013 B Romaine Neville RN,BSN,MHA [id number]

## 2013-04-09 NOTE — Progress Notes (Signed)
NT called the writer to the room, patient visitor told the writer that patient is having SOB.On assessment, vital sign within normal parameter:BP 173/72,O2 sat 95% on room air, p 84. Patient denies any pain to the chest. MD  Notified.

## 2013-04-09 NOTE — Progress Notes (Signed)
PT Cancellation Note  Patient Details Name: Kelli [name] MRN: [id number] DOB: Dec 08, 1950   Cancelled Treatment:    Reason Eval/Treat Not Completed: Medical issues which prohibited therapy.  Spoke with RN who notes elevated BP and vomiting this am.  Will hold therapy evals at this time and try another time.     Sim Choquette, Thornton Papas 04/09/2013, 9:31 AM

## 2013-04-09 NOTE — Progress Notes (Signed)
Stroke Team Progress Note  HISTORY Kelli [name] is an 63 y.o. female a history of hypertension, hyperlipidemia, watershed cerebral infarctions, right ICA occlusion and left CEA, presenting with new onset vertigo with nausea and vomiting as well as numbness involving left side of face and left upper extremity and left upper extremity weakness. Onset of symptoms was at 2:30 PM today 04/08/2013. Patient has been taking aspirin 81 mg per day. She presented to the emergency room at [facility] initially. She was noted initially deemed a candidate for TPA. However, she became worse with increasing nausea and vomiting as well as vertigo and worsening of numbness and left-sided weakness. Speech also became somewhat slurred. CTA was recommended by Specialist on[facility]Call neurologist. Study could not be done however because of patient's elevated creatinine. She was subsequently transferred here for MRI and MRA study to rule out basal artery thrombosis. MRI showed findings consistent with acute small left cerebellar ischemic infarction. There is no evidence of basilar artery occlusion or significant stenosis. No evidence of thrombus was seen. NIH stroke score on arrival to [facility] was 5. Patient had dark colored emesis which was guaiac positive. Patient was not administerd TPA.  SUBJECTIVE Her RN is at the bedside.  Overall she feels her condition is gradually worsening. She continues to complain of nausea and vomiting; she thinks Phenergan is helping. Numbness in her left arm and mouth are slightly improved.  OBJECTIVE Most recent Vital Signs: Filed Vitals:   04/09/13 0109 04/09/13 0423 04/09/13 0643 04/09/13 0811  BP: 174/78 194/80 190/71 189/74  Pulse: 98 91 92 93  Temp: 97.5 F (36.4 C) 97.8 F (36.6 C) 97.7 F (36.5 C) 98.4 F (36.9 C)  TempSrc: Oral Oral Oral Oral  Resp: 18 18 18 18   Height:      Weight:      SpO2: 99% 96% 97% 95%   CBG (last 3)   Recent Labs  04/08/13 1515 04/08/13 2229  04/09/13 0638  GLUCAP 106* 189* 148*    IV Fluid Intake:   . sodium chloride 50 mL/hr at 04/08/13 2209    MEDICATIONS  . amLODipine  10 mg Oral Daily  . aspirin  300 mg Rectal Daily   Or  . aspirin  325 mg Oral Daily  . atenolol  25 mg Oral Daily  . [START ON 04/10/2013] cloNIDine  0.3 mg Transdermal Q Tue  . enoxaparin (LOVENOX) injection  40 mg Subcutaneous QHS  . fesoterodine  8 mg Oral Daily  . hydrALAZINE  50 mg Oral BID AC  . insulin aspart  0-5 Units Subcutaneous QHS  . insulin aspart  0-9 Units Subcutaneous TID WC  . simvastatin  10 mg Oral q1800   PRN:  acetaminophen, acetaminophen, clonazePAM, hydrALAZINE, ondansetron (ZOFRAN) IV, senna-docusate  Diet:  Cardiac thin liquids Activity:  As tolerated DVT Prophylaxis:  Lovenox 40 mg sq daily   CLINICALLY SIGNIFICANT STUDIES Basic Metabolic Panel:  Recent Labs Lab 04/08/13 1510 04/08/13 1522 04/09/13 0725  NA 139 141 143  K 4.4 4.1 3.7  CL 101 103 103  CO2 24  --  25  GLUCOSE 114* 114* 134*  BUN 29* 29* 23  CREATININE 1.69* 1.90* 1.40*  CALCIUM 8.8  --  8.8   Liver Function Tests:  Recent Labs Lab 04/08/13 1510  AST 16  ALT 19  ALKPHOS 80  BILITOT 0.3  PROT 7.7  ALBUMIN 3.6   CBC:  Recent Labs Lab 04/08/13 1510 04/08/13 1522 04/09/13 0725  WBC  9.8  --  9.5  NEUTROABS 5.9  --   --   HGB 11.5* 11.9* 11.1*  HCT 34.9* 35.0* 33.9*  MCV 85.5  --  85.4  PLT 300  --  269   Coagulation:  Recent Labs Lab 04/08/13 1510  LABPROT 12.7  INR 0.97   Cardiac Enzymes:  Recent Labs Lab 04/08/13 1510  TROPONINI <0.30   Urinalysis:  Recent Labs Lab 04/08/13 1623  COLORURINE YELLOW  LABSPEC 1.020  PHURINE 7.0  GLUCOSEU NEGATIVE  HGBUR NEGATIVE  BILIRUBINUR NEGATIVE  KETONESUR NEGATIVE  PROTEINUR 100*  UROBILINOGEN 0.2  NITRITE NEGATIVE  LEUKOCYTESUR NEGATIVE   Lipid Panel    Component Value Date/Time   CHOL 171 04/09/2013 0725   TRIG 241* 04/09/2013 0725   HDL 33* 04/09/2013 0725    CHOLHDL 5.2 04/09/2013 0725   VLDL 48* 04/09/2013 0725   LDLCALC 90 04/09/2013 0725   HgbA1C  Lab Results  Component Value Date   HGBA1C 6.1* 10/04/2012    Urine Drug Screen:     Component Value Date/Time   LABOPIA NONE DETECTED 04/08/2013 1624   COCAINSCRNUR NONE DETECTED 04/08/2013 1624   LABBENZ NONE DETECTED 04/08/2013 1624   AMPHETMU NONE DETECTED 04/08/2013 1624   THCU NONE DETECTED 04/08/2013 1624   LABBARB NONE DETECTED 04/08/2013 1624    Alcohol Level:  Recent Labs Lab 04/08/13 1510  ETH <11     CT of the brain  04/08/2013   : 1. No acute abnormality. 2. Stable chronic small vessel white matter ischemic changes and old lacunar infarcts.   MRI of the brain  04/08/2013  Sub cm acute infarction within the left cerebellum. No other acute infarction. Extensive old ischemic changes throughout the hemispheric white matter with watershed infarction pattern on the right.    MRA of the brain  04/08/2013  Chronic right internal carotid artery occlusion. Anterior circulation supply occurs via a widely patent left internal carotid artery with patent anterior communicating artery.  Widely patent dominant right vertebral artery. Reconstituted flow in the left vertebral artery.  No flow demonstrated in the right posterior cerebral artery on today's study. This is new since the previous exam. However, no infarction is seen in the right PCA territory as might be expected.    2D Echocardiogram    Carotid Doppler    CXR  04/08/2013  No acute cardiopulmonary disease.     EKG  normal sinus rhythm. For complete results please see formal report.   Therapy Recommendations   Physical Exam   Middle aged Caucasian lady  In mild distress due to nausea and vomitting. t. Afebrile. Head is nontraumatic. Neck is supple without bruit. Hearing is normal. Cardiac exam no murmur or gallop. Lungs are clear to auscultation. Distal pulses are well felt. Neurological Exam : Drowsy but opens eyes easily and follows commands. Mild  dysarthria no aphasia. Extraocular movements are full range without nystagmus. Vision acuity seems adequate. Fundi were not visualized. No nystagmus. Mild left lower facial asymmetry. Tongue midline. Motor system exam reveals mild left approximately drift and weakness of left grip and intrinsic hand muscles. No lower eczema did drift. Finger to nose and knee to the coordination is slow but accurate. Sensation is preserved. Gait was not tested.  NIH stroke scale 3 ASSESSMENT Kelli [name] is a 63 y.o. female presenting with left sided numbness and weakness, progressing to nausea and vomiting. Imaging confirms a left cerebellar infarct. Infarct felt to be thrombotic secondary to small vessel disease.  On aspirin 81 mg orally every day prior to admission. Now on aspirin 325 mg orally every day for secondary stroke prevention. Patient with resultantmild LUE weakness, nausea and vomiting. Work up underway.   Nausea and vomiting, lethargic post phenergan, remains lethargic now. On arrival, [name] emesis was gastric occult positive, however no overt bleeding was noted. Hypertension - recently stopped labetalol and switched to atenolol at pt request Hyperlipidemia, LDL 126, on no statin PTA, now on statin, goal LDL < 100 - Now on Zocor. Chronic kidney disease, stage 3 - follow up with nephrology planned in March. Holding off ARB or ACE Depression - hx of. Dr. Domenic Polite concerned it may need further management Fibromyalgia  Cigarette smoker  Obesity, Body mass index is 36.23 kg/(m^2).  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 S/p L CEA on 10/03/2012 by Dr. Donnetta Hutching.  Hospital day # 1  TREATMENT/PLAN  Continue aspirin 325 mg orally every day for secondary stroke prevention.  Give zofran for nausea now. If patient able to tolerate food afterwards, we'll consider for Socrates trial.  F/u  Carotid doppler, 2D echo  Therapy evals  Consider enrollment in the SOCRATES stroke prevention trial ( aspirin versus ticagrelor). Discussed with patient and husband and they have expressed interest. We will see if patient meets enrollment criteria  Burnetta Sabin, MSN, RN, ANVP-BC, ANP-BC, GNP-BC [facility] Pager: [phone] 04/09/2013 9:22 AM  I have personally obtained a history, examined the patient, evaluated imaging results, and formulated the assessment and plan of care. I agree with the above. Antony Contras, MD

## 2013-04-09 NOTE — Progress Notes (Signed)
[facility] PROGRESS NOTE  Kelli [name] [id number] DOB: 10-29-1950 DOA: 04/08/2013 PCP: Manon Hilding, MD  Assessment/Plan: 1-acute left cerebellar stroke: secondary to small vessels disease. -patient was on ASA prior to admission -per neurology rec's will involve in Socrates Trial (Brilinta vs ASa), will need medication bottle at discharge from study.  -complete work up for stroke (2-D echo and carotid dopplers pending) -also PT/OT evaluation needed  2-Nausea/vomiting: will continue PRN antiemetics. Will add compazine to regimen if needed to control symptoms.  3-HTN: uncontrolled. Will start patient on BIDIL TID, continue also amlodipine and atenolol  4-HLD: continue statins  5-tobacco abuse: patient quit last year; she was congratulated and encourage to remains tobacco free and to avoid second hand smoking  6-CKD stage 3: Cr stable. Will avoid nephrotoxic agents and follow renal function closely.  7-Prediabetes: will use SSI while inpatient. A1C 6.4; will need close follow up and hypoglycemic regimen if CBG's and A1C gets higher   DVT: lovenox  Code Status: full  Family Communication: husband and sons at bedside Disposition Plan: to be determine    Consultants:  Neurology   Procedures:  See below for x-ray reports  2-D echo (pending)  Carotid dopplers (pending)  Antibiotics:  None   HPI/Subjective: Patient with ongoing nausea, denies vomiting today; reports numbness and tingling on her left arm and mouth is better.  Objective: Filed Vitals:   04/09/13 1425  BP: 161/55  Pulse: 76  Temp: 98.1 F (36.7 C)  Resp: 18    Intake/Output Summary (Last 24 hours) at 04/09/13 1526 Last data filed at 04/08/13 2359  Gross per 24 hour  Intake      0 ml  Output    302 ml  Net   -302 ml   Filed Weights   04/08/13 1507 04/08/13 2115  Weight: 97.523 kg (215 lb) 98.748 kg (217 lb 11.2 oz)    Exam:   General:  Afebrile, complaining of ongoing nausea;  slight lethargy from phenergan   Cardiovascular: regular rate, no rubs or gallops  Respiratory: CTA bilaterally  Abdomen: soft, NT, ND, positive BS  Musculoskeletal: no edema, no cyanosis, no clubbing  Data Reviewed: Basic Metabolic Panel:  Recent Labs Lab 04/08/13 1510 04/08/13 1522 04/09/13 0725  NA 139 141 143  K 4.4 4.1 3.7  CL 101 103 103  CO2 24  --  25  GLUCOSE 114* 114* 134*  BUN 29* 29* 23  CREATININE 1.69* 1.90* 1.40*  CALCIUM 8.8  --  8.8   Liver Function Tests:  Recent Labs Lab 04/08/13 1510  AST 16  ALT 19  ALKPHOS 80  BILITOT 0.3  PROT 7.7  ALBUMIN 3.6   CBC:  Recent Labs Lab 04/08/13 1510 04/08/13 1522 04/09/13 0725  WBC 9.8  --  9.5  NEUTROABS 5.9  --   --   HGB 11.5* 11.9* 11.1*  HCT 34.9* 35.0* 33.9*  MCV 85.5  --  85.4  PLT 300  --  269   Cardiac Enzymes:  Recent Labs Lab 04/08/13 1510  TROPONINI <0.30   CBG:  Recent Labs Lab 04/08/13 1515 04/08/13 2229 04/09/13 0638 04/09/13 1133  GLUCAP 106* 189* 148* 157*    Studies: Ct Head Wo Contrast  04/08/2013   CLINICAL DATA:  Bilateral arm numbness.  EXAM: CT HEAD WITHOUT CONTRAST  TECHNIQUE: Contiguous axial images were obtained from the base of the skull through the vertex without intravenous contrast.  COMPARISON:  01/16/2013.  FINDINGS: No significant change in patchy white  matter low density in both cerebral hemispheres and small areas of more focal low density. Stable old left basal ganglia lacunar infarct or prominent perivascular space. The ventricles remain normal in size and position. No intracranial hemorrhage, mass lesion or CT evidence of acute infarction. Unremarkable bones and included paranasal sinuses.  IMPRESSION: 1. No acute abnormality. 2. Stable chronic small vessel white matter ischemic changes and old lacunar infarcts. These results were called by telephone at the time of interpretation on 04/08/2013 at 3:29 PM to Dr. Ezequiel Essex , who verbally acknowledged  these results.   Electronically Signed   By: Enrique Sack M.D.   On: 04/08/2013 15:31   Mr Jodene Nam Head Wo Contrast  04/08/2013   CLINICAL DATA:  Previous left carotid endarterectomy. Chronic right carotid occlusion. Stroke.  EXAM: MRI HEAD WITHOUT CONTRAST  MRA HEAD WITHOUT CONTRAST  TECHNIQUE: Multiplanar, multiecho pulse sequences of the brain and surrounding structures were obtained without intravenous contrast. Angiographic images of the head were obtained using MRA technique without contrast.  COMPARISON:  Head CT same day.  CT angiography an MRI brain 2014  FINDINGS: MRI HEAD FINDINGS  Diffusion imaging shows a sub cm acute infarction within the left cerebellum. No other acute infarction.  There chronic small-vessel ischemic changes within the pons. There are chronic small-vessel ischemic changes throughout the cerebral hemispheric white matter. There is old subcortical infarction in the right frontoparietal region and in the left parietal region. There is some hemosiderin deposition in those areas related to the old infarctions. No evidence of mass lesion, acute hemorrhage, hydrocephalus or extra-axial collection. No pituitary mass. No inflammatory sinus disease.  MRA HEAD FINDINGS  There is chronic right internal carotid artery occlusion. The left internal carotid artery is widely patent. There is some atherosclerotic irregularity in the carotid siphon region. The left internal carotid arteries supplies the left anterior and middle cerebral artery territories and through a patent anterior communicating artery, the right anterior and middle cerebral artery territories. Distal branch vessels do show some atherosclerotic irregularity.  The right vertebral artery is a large vessel widely patent to the basilar. There is reconstituted flow in the left vertebral artery. No basilar stenosis. Posterior circulation branch vessels are patent. No flow is seen in the right posterior cerebral artery. This is a new finding  compared to previous examinations. However, no infarction is noted in the right PCA territory.  IMPRESSION: Sub cm acute infarction within the left cerebellum. No other acute infarction. Extensive old ischemic changes throughout the hemispheric white matter with watershed infarction pattern on the right.  Chronic right internal carotid artery occlusion. Anterior circulation supply occurs via a widely patent left internal carotid artery with patent anterior communicating artery.  Widely patent dominant right vertebral artery. Reconstituted flow in the left vertebral artery.  No flow demonstrated in the right posterior cerebral artery on today's study. This is new since the previous exam. However, no infarction is seen in the right PCA territory as might be expected.   Electronically Signed   By: Nelson Chimes M.D.   On: 04/08/2013 19:58   Mr Brain Wo Contrast  04/08/2013   CLINICAL DATA:  Previous left carotid endarterectomy. Chronic right carotid occlusion. Stroke.  EXAM: MRI HEAD WITHOUT CONTRAST  MRA HEAD WITHOUT CONTRAST  TECHNIQUE: Multiplanar, multiecho pulse sequences of the brain and surrounding structures were obtained without intravenous contrast. Angiographic images of the head were obtained using MRA technique without contrast.  COMPARISON:  Head CT same day.  CT angiography  an MRI brain 2014  FINDINGS: MRI HEAD FINDINGS  Diffusion imaging shows a sub cm acute infarction within the left cerebellum. No other acute infarction.  There chronic small-vessel ischemic changes within the pons. There are chronic small-vessel ischemic changes throughout the cerebral hemispheric white matter. There is old subcortical infarction in the right frontoparietal region and in the left parietal region. There is some hemosiderin deposition in those areas related to the old infarctions. No evidence of mass lesion, acute hemorrhage, hydrocephalus or extra-axial collection. No pituitary mass. No inflammatory sinus disease.   MRA HEAD FINDINGS  There is chronic right internal carotid artery occlusion. The left internal carotid artery is widely patent. There is some atherosclerotic irregularity in the carotid siphon region. The left internal carotid arteries supplies the left anterior and middle cerebral artery territories and through a patent anterior communicating artery, the right anterior and middle cerebral artery territories. Distal branch vessels do show some atherosclerotic irregularity.  The right vertebral artery is a large vessel widely patent to the basilar. There is reconstituted flow in the left vertebral artery. No basilar stenosis. Posterior circulation branch vessels are patent. No flow is seen in the right posterior cerebral artery. This is a new finding compared to previous examinations. However, no infarction is noted in the right PCA territory.  IMPRESSION: Sub cm acute infarction within the left cerebellum. No other acute infarction. Extensive old ischemic changes throughout the hemispheric white matter with watershed infarction pattern on the right.  Chronic right internal carotid artery occlusion. Anterior circulation supply occurs via a widely patent left internal carotid artery with patent anterior communicating artery.  Widely patent dominant right vertebral artery. Reconstituted flow in the left vertebral artery.  No flow demonstrated in the right posterior cerebral artery on today's study. This is new since the previous exam. However, no infarction is seen in the right PCA territory as might be expected.   Electronically Signed   By: Nelson Chimes M.D.   On: 04/08/2013 19:58   Dg Chest Port 1 View  04/08/2013   CLINICAL DATA:  Stroke.  Productive cough.  EXAM: PORTABLE CHEST - 1 VIEW  COMPARISON:  DG CHEST 2 VIEW dated 01/16/2013; DG CHEST 2 VIEW dated 10/01/2012  FINDINGS: Cardiopericardial silhouette within normal limits. Mediastinal contours normal. Trachea midline. No airspace disease or effusion. Basilar  atelectasis is present, prominent on the left but similar to prior. Monitoring leads project over the chest.  IMPRESSION: No acute cardiopulmonary disease.   Electronically Signed   By: Dereck Ligas M.D.   On: 04/08/2013 23:58    Scheduled Meds: . amLODipine  10 mg Oral Daily  . atenolol  25 mg Oral Daily  . [START ON 04/10/2013] cloNIDine  0.3 mg Transdermal Q Tue  . enoxaparin (LOVENOX) injection  40 mg Subcutaneous QHS  . fesoterodine  8 mg Oral Daily  . insulin aspart  0-5 Units Subcutaneous QHS  . insulin aspart  0-9 Units Subcutaneous TID WC  . isosorbide-hydrALAZINE  1 tablet Oral TID  . Placebo/Aspirin - - Acetylsalicylic acid  3 tablet Oral Once  . [START ON 04/10/2013] Placebo/Aspirin-Acetylsalicylic acid  1 tablet Oral Daily  . Placebo/Ticagrelor  1 tablet Oral BID  . Placebo/Ticagrelor  2 tablet Oral Once  . simvastatin  10 mg Oral q1800   Continuous Infusions: . sodium chloride 50 mL/hr at 04/08/13 2209    Time spent: >30 minutes   Medford Staheli  [facility] Pager [phone]. If 7PM-7AM, please contact night-coverage at [email or web address], password  [email or web address] 04/09/2013, 3:26 PM  LOS: 1 day

## 2013-04-10 ENCOUNTER — Encounter (HOSPITAL_COMMUNITY): Payer: Self-pay | Admitting: Radiology

## 2013-04-10 ENCOUNTER — Inpatient Hospital Stay (HOSPITAL_COMMUNITY): Payer: 59

## 2013-04-10 DIAGNOSIS — H532 Diplopia: Secondary | ICD-10-CM

## 2013-04-10 DIAGNOSIS — R42 Dizziness and giddiness: Secondary | ICD-10-CM

## 2013-04-10 DIAGNOSIS — M6281 Muscle weakness (generalized): Secondary | ICD-10-CM

## 2013-04-10 DIAGNOSIS — I6350 Cerebral infarction due to unspecified occlusion or stenosis of unspecified cerebral artery: Secondary | ICD-10-CM

## 2013-04-10 DIAGNOSIS — I6789 Other cerebrovascular disease: Secondary | ICD-10-CM

## 2013-04-10 LAB — GLUCOSE, CAPILLARY
GLUCOSE-CAPILLARY: 137 mg/dL — AB (ref 70–99)
GLUCOSE-CAPILLARY: 121 mg/dL — AB (ref 70–99)
GLUCOSE-CAPILLARY: 114 mg/dL — AB (ref 70–99)
Glucose-Capillary: 127 mg/dL — ABNORMAL HIGH (ref 70–99)
Glucose-Capillary: 126 mg/dL — ABNORMAL HIGH (ref 70–99)

## 2013-04-10 LAB — CBC
HCT: 34.1 % — ABNORMAL LOW (ref 36.0–46.0)
HEMOGLOBIN: 11.4 g/dL — AB (ref 12.0–15.0)
MCH: 28.5 pg (ref 26.0–34.0)
MCHC: 33.4 g/dL (ref 30.0–36.0)
MCV: 85.3 fL (ref 78.0–100.0)
Platelets: 276 10*3/uL (ref 150–400)
RBC: 4 MIL/uL (ref 3.87–5.11)
RDW: 14 % (ref 11.5–15.5)
WBC: 11 10*3/uL — ABNORMAL HIGH (ref 4.0–10.5)

## 2013-04-10 LAB — COMPREHENSIVE METABOLIC PANEL
ALT: 16 U/L (ref 0–35)
AST: 44 U/L — AB (ref 0–37)
Albumin: 3.3 g/dL — ABNORMAL LOW (ref 3.5–5.2)
Alkaline Phosphatase: 65 U/L (ref 39–117)
BUN: 24 mg/dL — ABNORMAL HIGH (ref 6–23)
CO2: 23 meq/L (ref 19–32)
CREATININE: 1.45 mg/dL — AB (ref 0.50–1.10)
Calcium: 8.6 mg/dL (ref 8.4–10.5)
Chloride: 104 mEq/L (ref 96–112)
GFR, EST AFRICAN AMERICAN: 44 mL/min — AB (ref >90)
GFR, EST NON AFRICAN AMERICAN: 38 mL/min — AB (ref >90)
GLUCOSE: 186 mg/dL — AB (ref 70–99)
Potassium: 4.6 mEq/L (ref 3.7–5.3)
Sodium: 141 mEq/L (ref 137–147)
Total Bilirubin: 0.4 mg/dL (ref 0.3–1.2)
Total Protein: 7.1 g/dL (ref 6.0–8.3)

## 2013-04-10 MED ORDER — HYDRALAZINE HCL 50 MG PO TABS
50.0000 mg | ORAL_TABLET | Freq: Three times a day (TID) | ORAL | Status: DC
  Administered 2013-04-10 – 2013-04-11 (×5): 50 mg via ORAL
  Filled 2013-04-10 (×7): qty 1

## 2013-04-10 MED ORDER — RESOURCE THICKENUP CLEAR PO POWD
ORAL | Status: DC | PRN
  Filled 2013-04-10: qty 125

## 2013-04-10 MED ORDER — SALINE SPRAY 0.65 % NA SOLN
1.0000 | NASAL | Status: DC | PRN
  Administered 2013-04-10 – 2013-04-12 (×3): 1 via NASAL
  Filled 2013-04-10: qty 44

## 2013-04-10 MED ORDER — SODIUM CHLORIDE 0.9 % IV BOLUS (SEPSIS)
500.0000 mL | Freq: Once | INTRAVENOUS | Status: AC
  Administered 2013-04-10: 500 mL via INTRAVENOUS

## 2013-04-10 MED ORDER — ATENOLOL 25 MG PO TABS
25.0000 mg | ORAL_TABLET | Freq: Two times a day (BID) | ORAL | Status: DC
  Administered 2013-04-10 – 2013-04-17 (×14): 25 mg via ORAL
  Filled 2013-04-10 (×16): qty 1

## 2013-04-10 NOTE — Progress Notes (Addendum)
Met patient this morning, upon assessment, noted that Lt arm could not raise off the bed, Left leg too weak to lift off of bed. Compared to report and the charting i reviewed this was a change. VS taken and charted, glucose from 0710 was 126  I called Dr Tat who came up and assessed patient, upon his assessment patient did move left arm up into the air for a few seconds. Stat head CT ordered. Will cont to monitor  RR RN also called to "put patient on radar."

## 2013-04-10 NOTE — Evaluation (Addendum)
Clinical/Bedside Swallow Evaluation Patient Details  Name: Kelli [name] MRN: [id number] Date of Birth: October 08, 1950  Today's Date: 04/10/2013 Time: [id number] SLP Time Calculation (min): 44 min  Past Medical History:  Past Medical History  Diagnosis Date  . Essential hypertension, benign   . Fibromyalgia   . Cerebral infarction Aug 2014    Bihemispheric watershed infarcts  . Mixed hyperlipidemia   . Borderline diabetes   . Urinary incontinence   . Carotid artery occlusion     Occluded RICA, status post left CEA  August 2014 - Dr. Donnetta Hutching  . CKD (chronic kidney disease) stage 3, GFR 30-59 ml/min   . 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 Urethral dilation  1980's  . Combined hysterectomy vaginal w/ mmk / a&p repair  1981  . Vaginal hysterectomy  1981    "partial" (10/04/2012)  . Endarterectomy Left 10/06/2012    Procedure: Carotid Endarterectomy with Finesse patch angioplasty;  Surgeon: [name], MD;  Location: [place];  Service: Vascular;  Laterality: Left;   HPI:  Kelli [name] is an 63 y.o. female a history of hypertension, hyperlipidemia, watershed cerebral infarctions, right ICA occlusion and left CEA, presenting with new onset vertigo with nausea and vomiting as well as numbness involving left side of face and left upper extremity and left upper extremity weakness. Onset of symptoms was at 2:30 PM today 04/08/2013. Patient has been taking aspirin 81 mg per day per chart review.  She presented to the emergency room at [facility] initially. She was noted initially deemed a candidate for TPA. However, she became worse with increasing nausea and vomiting as well as vertigo and worsening of numbness and left-sided weakness. Speech also became somewhat slurred. CTA was recommended by Specialist on-Call neurologist. She was subsequently transferred here  for MRI and MRA study to rule out basal artery thrombosis. MRI showed findings consistent with acute small left cerebellar ischemic infarction. There is no evidence of basilar artery occlusion or significant stenosis. No evidence of thrombus was seen. NIH stroke score on arrival to [facility] was 5. Patient had dark colored emesis which was guaiac positive. Patient was not administerd TPA.  Pt had speech evaluation yesterday and was noted to be coughing with intake, which pt states has occured since hospitalization.  Bedside evaluation ordered due to pt worsening dysphagia.     Assessment / Plan / Recommendation Clinical Impression  Pt presents with decline in oral capabilities/cranial nerve function compared to yesterday.  Impairments appear present in trigeminal, facial, ? vagus and hypoglossal nerves with decreased facial sensation, decreased labial seal, lingual weakness - deviation to left upon protrusion, weak cough and phonation.   Concern for aspiration of thin and solids present today suspected due to poor oral control/mastication ability resulting in premature spillage into pharynx and delayed swallow response.  Cough noted with thin liquids via straw and throat clearing with tsp thin. Decreased labial seal results in overt loss of nectar bolus via cup.   Of note, pt reports she is biting the inside of her left cheek today as well and cough is weak/nonprotective.   Note pt with deficits on left with left cerebellar CVA.    Recommend to modify diet for maximal airway protection to puree/nectar - liquids via tsp only.  Rec allow small single ice chips as pt complains of terrible xerostomia.  Suspect dysphagia exacerbated by pt's lethargy today.    Sister Kelli [name] present and both sister and pt educated to recommendations/precautions.  Will follow closely due to waxing/waning pt status.     Aspiration Risk  Moderate    Diet Recommendation Dysphagia 1 (Puree);Nectar-thick liquid (small single ice chips)    Liquid Administration via: Spoon (spoon only!) Medication Administration: Crushed with puree Supervision: Full supervision/cueing for compensatory strategies Compensations: Slow rate;Small sips/bites;Check for pocketing Postural Changes and/or Swallow Maneuvers: Seated upright 90 degrees;Upright 30-60 min after meal    Other  Recommendations Oral Care Recommendations: Oral care BID   Follow Up Recommendations  Inpatient Rehab    Frequency and Duration min 3x week  2 weeks   Pertinent Vitals/Pain Afebrile, decreased, weak cough!      Swallow Study Prior Functional Status   pt with h/o CVA denies swallow deficits with that event     General HPI: Kelli [name] is an 63 y.o. female a history of hypertension, hyperlipidemia, watershed cerebral infarctions, right ICA occlusion and left CEA, presenting with new onset vertigo with nausea and vomiting as well as numbness involving left side of face and left upper extremity and left upper extremity weakness. Onset of symptoms was at 2:30 PM today 04/08/2013. Patient has been taking aspirin 81 mg per day per chart review.  She presented to the emergency room at [facility] initially. She was noted initially deemed a candidate for TPA. However, she became worse with increasing nausea and vomiting as well as vertigo and worsening of numbness and left-sided weakness. Speech also became somewhat slurred. CTA was recommended by Specialist on-Call neurologist. She was subsequently transferred here for MRI and MRA study to rule out basal artery thrombosis. MRI showed findings consistent with acute small left cerebellar ischemic infarction. There is no evidence of basilar artery occlusion or significant stenosis. No evidence of thrombus was seen. NIH stroke score on arrival to [facility] was 5. Patient had dark colored emesis which was guaiac positive. Patient was not administerd TPA.  Pt had speech evaluation yesterday and was noted to be coughing with  intake, which pt states has occured since hospitalization.  Bedside evaluation ordered due to pt worsening dysphagia.   Type of Study: Bedside swallow evaluation Previous Swallow Assessment: none Diet Prior to this Study: Regular;Thin liquids Temperature Spikes Noted: No Respiratory Status: Room air History of Recent Intubation: No Behavior/Cognition: Lethargic;Distractible;Requires cueing;Decreased sustained attention;Cooperative;Pleasant mood Oral Cavity - Dentition: Adequate natural dentition Self-Feeding Abilities: Needs assist Patient Positioning: Upright in bed Baseline Vocal Quality: Low vocal intensity Volitional Cough: Weak Volitional Swallow: Unable to elicit    Oral/Motor/Sensory Function Overall Oral Motor/Sensory Function: Impaired Labial ROM: Reduced left Labial Symmetry: Abnormal symmetry left Labial Strength: Reduced Labial Sensation: Reduced Lingual ROM: Reduced left (lingual deviation to left upon protrusion) Lingual Symmetry: Abnormal symmetry left Lingual Strength: Reduced Facial ROM: Reduced left Facial Symmetry: Left droop Facial Strength: Reduced Facial Sensation: Reduced Velum:  (pt did not open oral cavity adequately to view) Mandible: Within Functional Limits   Ice Chips Ice chips: Impaired Oral Phase Impairments: Reduced labial seal;Reduced lingual movement/coordination;Impaired anterior to posterior transit;Impaired mastication Oral Phase Functional Implications: Prolonged oral transit Pharyngeal Phase Impairments: Suspected delayed Swallow   Thin Liquid Thin Liquid: Impaired Presentation: Straw;Spoon Oral Phase Impairments: Reduced labial seal;Reduced lingual movement/coordination;Impaired anterior to posterior transit Oral Phase Functional Implications: Prolonged oral transit Pharyngeal  Phase Impairments: Suspected delayed Swallow;Multiple swallows;Cough - Immediate;Throat Clearing - Delayed;Throat Clearing - Immediate    Nectar  Thick Nectar Thick  Liquid: Impaired Presentation: Cup;Spoon Oral Phase Impairments: Reduced labial seal;Reduced lingual movement/coordination;Impaired anterior to posterior transit Oral phase functional implications: Right anterior spillage;Prolonged oral transit Pharyngeal Phase Impairments: Suspected delayed Swallow   Honey Thick Honey Thick Liquid: Not tested   Puree Puree: Impaired Presentation: Spoon Oral Phase Impairments: Reduced lingual movement/coordination;Reduced labial seal;Impaired anterior to posterior transit Oral Phase Functional Implications: Prolonged oral transit Pharyngeal Phase Impairments: Suspected delayed Swallow   Solid   GO    Solid: Impaired Oral Phase Impairments: Reduced labial seal;Reduced lingual movement/coordination;Impaired anterior to posterior transit;Impaired mastication Pharyngeal Phase Impairments: Suspected delayed Swallow;Cough - Immediate       Claudie Fisherman, [place] [facility] SLP [phone]

## 2013-04-10 NOTE — Progress Notes (Signed)
OT Cancellation Note  Patient Details Name: Kelli [name] MRN: [id number] DOB: 1950/03/24   Cancelled Treatment:    Reason Eval/Treat Not Completed: Medical issues which prohibited therapy  Spoke with PT and per RN report pt with possible worsening of neurological sx. Also noted pt BP continues to be elevated. Will hold OT eval at this time. Please advise if pt appropriate for mobility.    Benito Mccreedy OTR/L [id number] 04/10/2013, 9:51 AM

## 2013-04-10 NOTE — Progress Notes (Signed)
Stroke Team Progress Note  HISTORY Kelli [name] is an 63 y.o. female a history of hypertension, hyperlipidemia, watershed cerebral infarctions, right ICA occlusion and left CEA, presenting with new onset vertigo with nausea and vomiting as well as numbness involving left side of face and left upper extremity and left upper extremity weakness. Onset of symptoms was at 2:30 PM today 04/08/2013. Patient has been taking aspirin 81 mg per day. She presented to the emergency room at [facility] initially. She was noted initially deemed a candidate for TPA. However, she became worse with increasing nausea and vomiting as well as vertigo and worsening of numbness and left-sided weakness. Speech also became somewhat slurred. CTA was recommended by Specialist on-Call neurologist. Study could not be done however because of patient's elevated creatinine. She was subsequently transferred here for MRI and MRA study to rule out basal artery thrombosis. MRI showed findings consistent with acute small left cerebellar ischemic infarction. There is no evidence of basilar artery occlusion or significant stenosis. No evidence of thrombus was seen. NIH stroke score on arrival to [facility] was 5. Patient had dark colored emesis which was guaiac positive. Patient was not administerd TPA.  SUBJECTIVE Daughter at bedside. Per RN at the bedside, the patient continues to wax and wane throughout the am.  OBJECTIVE Most recent Vital Signs: Filed Vitals:   04/10/13 0744 04/10/13 1050 04/10/13 1100 04/10/13 1133  BP: 175/73 144/47 175/73 153/52  Pulse: 99 74  76  Temp:  98.4 F (36.9 C)    TempSrc:  Oral    Resp:  18    Height:      Weight:      SpO2: 97% 100%  95%   CBG (last 3)   Recent Labs  04/09/13 2302 04/10/13 0706 04/10/13 1157  GLUCAP 127* 126* 137*    IV Fluid Intake:   . sodium chloride 10 mL/hr (04/09/13 2155)    MEDICATIONS  . amLODipine  10 mg Oral Daily  . atenolol  25 mg Oral BID  .  cloNIDine  0.3 mg Transdermal Q Tue  . enoxaparin (LOVENOX) injection  40 mg Subcutaneous QHS  . fesoterodine  8 mg Oral Daily  . hydrALAZINE  50 mg Oral Q8H  . insulin aspart  0-5 Units Subcutaneous QHS  . insulin aspart  0-9 Units Subcutaneous TID WC  . Placebo/Aspirin-Acetylsalicylic acid  1 tablet Oral Daily  . Placebo/Ticagrelor  1 tablet Oral BID  . simvastatin  10 mg Oral q1800   PRN:  acetaminophen, acetaminophen, clonazePAM, hydrALAZINE, ondansetron (ZOFRAN) IV, prochlorperazine, RESOURCE THICKENUP CLEAR, senna-docusate  Diet:  Dysphagia thin liquids Activity:  As tolerated DVT Prophylaxis:  Lovenox 40 mg sq daily   CLINICALLY SIGNIFICANT STUDIES Basic Metabolic Panel:   Recent Labs Lab 04/09/13 0725 04/10/13 0940  NA 143 141  K 3.7 4.6  CL 103 104  CO2 25 23  GLUCOSE 134* 186*  BUN 23 24*  CREATININE 1.40* 1.45*  CALCIUM 8.8 8.6   Liver Function Tests:   Recent Labs Lab 04/08/13 1510 04/10/13 0940  AST 16 44*  ALT 19 16  ALKPHOS 80 65  BILITOT 0.3 0.4  PROT 7.7 7.1  ALBUMIN 3.6 3.3*   CBC:   Recent Labs Lab 04/08/13 1510  04/09/13 0725 04/10/13 0940  WBC 9.8  --  9.5 11.0*  NEUTROABS 5.9  --   --   --   HGB 11.5*  < > 11.1* 11.4*  HCT 34.9*  < > 33.9* 34.1*  MCV 85.5  --  85.4 85.3  PLT 300  --  269 276  < > = values in this interval not displayed. Coagulation:   Recent Labs Lab 04/08/13 1510  LABPROT 12.7  INR 0.97   Cardiac Enzymes:   Recent Labs Lab 04/08/13 1510  TROPONINI <0.30   Urinalysis:   Recent Labs Lab 04/08/13 1623  COLORURINE YELLOW  LABSPEC 1.020  PHURINE 7.0  GLUCOSEU NEGATIVE  HGBUR NEGATIVE  BILIRUBINUR NEGATIVE  KETONESUR NEGATIVE  PROTEINUR 100*  UROBILINOGEN 0.2  NITRITE NEGATIVE  LEUKOCYTESUR NEGATIVE   Lipid Panel    Component Value Date/Time   CHOL 171 04/09/2013 0725   TRIG 241* 04/09/2013 0725   HDL 33* 04/09/2013 0725   CHOLHDL 5.2 04/09/2013 0725   VLDL 48* 04/09/2013 0725   LDLCALC 90  04/09/2013 0725   HgbA1C  Lab Results  Component Value Date   HGBA1C 6.4* 04/09/2013    Urine Drug Screen:     Component Value Date/Time   LABOPIA NONE DETECTED 04/08/2013 1624   COCAINSCRNUR NONE DETECTED 04/08/2013 1624   LABBENZ NONE DETECTED 04/08/2013 1624   AMPHETMU NONE DETECTED 04/08/2013 1624   THCU NONE DETECTED 04/08/2013 1624   LABBARB NONE DETECTED 04/08/2013 1624    Alcohol Level:   Recent Labs Lab 04/08/13 1510  ETH <11   CT of the brain   04/10/2013 The known recent left cerebellar infarct is better define currently than on studies obtained 2 days prior. There is extensive  supratentorial small vessel disease as well as a prior small left parietal lobe infarct. No convincing new infarct is seen in these areas, although a small infarct could easily be obscured on CT by the degree of underlying small vessel disease. There is no acute hemorrhage or mass effect. The middle cerebral arteries do not show increased attenuation on either side. 04/08/2013   : 1. No acute abnormality. 2. Stable chronic small vessel white matter ischemic changes and old lacunar infarcts.   MRI of the brain  04/08/2013  Sub cm acute infarction within the left cerebellum. No other acute infarction. Extensive old ischemic changes throughout the hemispheric white matter with watershed infarction pattern on the right.    MRA of the brain  04/08/2013  Chronic right internal carotid artery occlusion. Anterior circulation supply occurs via a widely patent left internal carotid artery with patent anterior communicating artery.  Widely patent dominant right vertebral artery. Reconstituted flow in the left vertebral artery.  No flow demonstrated in the right posterior cerebral artery on today's study. This is new since the previous exam. However, no infarction is seen in the right PCA territory as might be expected.    2D Echocardiogram    Carotid Doppler  Left: CEA patent. 1-39% ICA stenosis. Right: Vertebral artery flow is  antegrade. Left: Vertebral artery flow is retrograde.   CXR   04/09/2013 No acute cardiopulmonary disease. 04/08/2013  No acute cardiopulmonary disease.     EKG  normal sinus rhythm. For complete results please see formal report.   Therapy Recommendations   Physical Exam   Middle aged Caucasian lady  In mild distress due to nausea and vomitting. t. Afebrile. Head is nontraumatic. Neck is supple without bruit. Hearing is normal. Cardiac exam no murmur or gallop. Lungs are clear to auscultation. Distal pulses are well felt. Neurological Exam : Drowsy but opens eyes easily and follows commands. Mild dysarthria no aphasia. Extraocular movements are full range without nystagmus. Vision acuity seems adequate. Fundi were  not visualized. No nystagmus. Mild left lower facial asymmetry. Tongue midline. Motor system exam reveals severe LUE drift and weakness of left grip and intrinsic hand muscles. No lower extremity drift but mild weakness of ankle dorsiflexors and hip flexors. Finger to nose and knee to the coordination is slow but accurate. Sensation is preserved. Gait was not tested.  NIH stroke scale 4 - 1 dysarthria, 1 face, 2 for arm  ASSESSMENT Ms. KENASIA BRACKENS is a 63 y.o. female presenting with left sided numbness and weakness, progressing to nausea and vomiting. Imaging confirms a left cerebellar infarct. Infarct felt to be thrombotic secondary to small vessel disease.  Waxing and waning of left hemiparesis over night. CT unchanged. On aspirin 81 mg orally every day prior to admission. Now  enrolled in Socrates, ticagrelor vs aspirin for secondary stroke prevention. Patient with resultantmild LUE weakness, nausea and vomiting. Work up underway.   Nausea and vomiting, lethargic post phenergan, remains lethargic now. On arrival, [name] emesis was gastric occult positive, however no overt bleeding was noted. Hypertension - recently stopped labetalol and switched to atenolol at pt  request Hyperlipidemia, LDL 126, on no statin PTA, now on statin, goal LDL < 100 - Now on Zocor. Chronic kidney disease, stage 3 - follow up with nephrology planned in March. Holding off ARB or ACE Depression - hx of. Dr. Domenic Polite concerned it may need further management Fibromyalgia  Cigarette smoker  Obesity, Body mass index is 36.23 kg/(m^2).  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 S/p L CEA on 10/03/2012 by Dr. Donnetta Hutching. No hx heart failure  Hospital day # 2  TREATMENT/PLAN  Continue Socrates trial, ticagrelor vs aspirin for secondary stroke prevention.  Keep in bed today. OOB with therapy in am  Increase IVF to 50 cc hr. Will given 500 cc bolus now.  F/u 2D echo  Burnetta Sabin, MSN, RN, ANVP-BC, ANP-BC, GNP-BC [facility] Pager: [phone] 04/10/2013 12:32 PM  I have personally obtained a history, examined the patient, evaluated imaging results, and formulated the assessment and plan of care. I agree with the above.  Antony Contras, MD

## 2013-04-10 NOTE — Progress Notes (Signed)
Patient was admitted to the hospital for acute stroke. During evaluation, patient was approached by the stroke team about the SOCRATES trial. SOCRATES is a randomized, parallel group study evaluating the efficacy of ticagrelor compared to aspirin in reducing major vascular events (composite of all-cause mortality, myocardial infarction [MI], and stroke) in patients with acute ischemic stroke and TIA. Patient and family were given a informed consent to read and encouraged to ask questions about the consent. After reading the informed consent, patient agreed to participate in the trial. No study related test or procedures were performed prior to the consenting process. Patient signed the consent and a copy was given to patient for personal record. Per Dr. Leonie Man, patient meet the inclusion criteria and was randomized to study drug kits# [id number] and [phone]. Patient was given study drug at 14:15 as loading dose.

## 2013-04-10 NOTE — Evaluation (Signed)
SLP Cancellation Note  Patient Details Name: Kelli [name] MRN: [id number] DOB: 04/14/1950   Cancelled treatment:       Reason Eval/Treat Not Completed: Medical issues which prohibited therapy (per nursing student, pt with possible neuro changes overnight, now for repeat Ct head.  Will return for swallow evaluation as able.  Thanks. )   Luanna Salk, [facility] SLP [phone]

## 2013-04-10 NOTE — Progress Notes (Signed)
VASCULAR LAB PRELIMINARY  PRELIMINARY  PRELIMINARY  PRELIMINARY  Carotid duplex  completed.    Preliminary report:  Right:  Occluded internal carotid artery.    Left:   CEA patent. 1-39% ICA stenosis.  Right:  Vertebral artery flow is antegrade.  Left:  Vertebral artery flow is retrograde.     Somaya Grassi, RVT 04/10/2013, 11:57 AM

## 2013-04-10 NOTE — Progress Notes (Signed)
[facility] PROGRESS NOTE  Kelli [name] [id number] DOB: [birth year] DOA: 04/08/2013 PCP: Manon Hilding, MD  Interim summary 63 year old female with a history of hypertension, bilateral hemispheric strokes, left carotid stenosis status post left carotid endarterectomy August 2014, hyperlipidemia, impaired glucose tolerance presented to [facility] with dizziness, left-sided numbness and weakness. She was transferred to [facility] or MRI of the brain revealed acute left cerebellar infarct. MRA of the brain also showed no flow right PCA. The patient was enrolled in Socrates trial during this admission. Neurology was consult has been following. On the morning of 04/10/2013, I was called by RN to report possible worsening left upper extremity and left lower extremity weakness. Repeat CT of the brain was obtained. Assessment/Plan: Acute left cerebellar stroke -History of left carotid endarterectomy -MRI brain shows no flow in the right PCA, but not associated with infarct in this territory -LDL 90--patient was on simvastatin 10 mg prior to admission -Await echocardiogram and carotid Dopplers -PT evaluation -Urine drug screen and alcohol level negative -Repeat CT brain 04/10/13 due to RN report of worsen LUE/LLE weak -neurology following Hypertension, poorly controlled -Increase hydralazine to 50 mg 3 times a day -Increase the atenolol to 25 mg twice a day -Continue clonidine TTS 3 -Continue amlodipine 10 mg daily Impaired glucose tolerance -Hemoglobin A1c 6.4 -Lifestyle modification Prior renal artery duplex negative for renal artery stenosis- CKD stage III -Review of the records reveals large variation and baseline serum creatinine -varies from 1.4-1.9 -Urinalysis is bland Positive gastro-occult  -Hemoglobin has remained stable  -Continue to monitor  -Patient denies any abdominal pain, tolerating diet  History of tobacco use  -Patient quit one year ago  Anxiety disorder/fibromyalgia   -Continue Klonopin 0.5 mg twice a day   Family Communication:   Pt at beside Disposition Plan:   Home when medically stable       Procedures/Studies: Ct Head Wo Contrast  04/08/2013   CLINICAL DATA:  Bilateral arm numbness.  EXAM: CT HEAD WITHOUT CONTRAST  TECHNIQUE: Contiguous axial images were obtained from the base of the skull through the vertex without intravenous contrast.  COMPARISON:  01/16/2013.  FINDINGS: No significant change in patchy white matter low density in both cerebral hemispheres and small areas of more focal low density. Stable old left basal ganglia lacunar infarct or prominent perivascular space. The ventricles remain normal in size and position. No intracranial hemorrhage, mass lesion or CT evidence of acute infarction. Unremarkable bones and included paranasal sinuses.  IMPRESSION: 1. No acute abnormality. 2. Stable chronic small vessel white matter ischemic changes and old lacunar infarcts. These results were called by telephone at the time of interpretation on 04/08/2013 at 3:29 PM to Dr. Ezequiel Essex , who verbally acknowledged these results.   Electronically Signed   By: Enrique Sack M.D.   On: 04/08/2013 15:31   Mr Jodene Nam Head Wo Contrast  04/08/2013   CLINICAL DATA:  Previous left carotid endarterectomy. Chronic right carotid occlusion. Stroke.  EXAM: MRI HEAD WITHOUT CONTRAST  MRA HEAD WITHOUT CONTRAST  TECHNIQUE: Multiplanar, multiecho pulse sequences of the brain and surrounding structures were obtained without intravenous contrast. Angiographic images of the head were obtained using MRA technique without contrast.  COMPARISON:  Head CT same day.  CT angiography an MRI brain 2014  FINDINGS: MRI HEAD FINDINGS  Diffusion imaging shows a sub cm acute infarction within the left cerebellum. No other acute infarction.  There chronic small-vessel ischemic changes within the pons. There are chronic small-vessel ischemic changes throughout  the cerebral hemispheric white matter.  There is old subcortical infarction in the right frontoparietal region and in the left parietal region. There is some hemosiderin deposition in those areas related to the old infarctions. No evidence of mass lesion, acute hemorrhage, hydrocephalus or extra-axial collection. No pituitary mass. No inflammatory sinus disease.  MRA HEAD FINDINGS  There is chronic right internal carotid artery occlusion. The left internal carotid artery is widely patent. There is some atherosclerotic irregularity in the carotid siphon region. The left internal carotid arteries supplies the left anterior and middle cerebral artery territories and through a patent anterior communicating artery, the right anterior and middle cerebral artery territories. Distal branch vessels do show some atherosclerotic irregularity.  The right vertebral artery is a large vessel widely patent to the basilar. There is reconstituted flow in the left vertebral artery. No basilar stenosis. Posterior circulation branch vessels are patent. No flow is seen in the right posterior cerebral artery. This is a new finding compared to previous examinations. However, no infarction is noted in the right PCA territory.  IMPRESSION: Sub cm acute infarction within the left cerebellum. No other acute infarction. Extensive old ischemic changes throughout the hemispheric white matter with watershed infarction pattern on the right.  Chronic right internal carotid artery occlusion. Anterior circulation supply occurs via a widely patent left internal carotid artery with patent anterior communicating artery.  Widely patent dominant right vertebral artery. Reconstituted flow in the left vertebral artery.  No flow demonstrated in the right posterior cerebral artery on today's study. This is new since the previous exam. However, no infarction is seen in the right PCA territory as might be expected.   Electronically Signed   By: Nelson Chimes M.D.   On: 04/08/2013 19:58   Mr Brain Wo  Contrast  04/08/2013   CLINICAL DATA:  Previous left carotid endarterectomy. Chronic right carotid occlusion. Stroke.  EXAM: MRI HEAD WITHOUT CONTRAST  MRA HEAD WITHOUT CONTRAST  TECHNIQUE: Multiplanar, multiecho pulse sequences of the brain and surrounding structures were obtained without intravenous contrast. Angiographic images of the head were obtained using MRA technique without contrast.  COMPARISON:  Head CT same day.  CT angiography an MRI brain 2014  FINDINGS: MRI HEAD FINDINGS  Diffusion imaging shows a sub cm acute infarction within the left cerebellum. No other acute infarction.  There chronic small-vessel ischemic changes within the pons. There are chronic small-vessel ischemic changes throughout the cerebral hemispheric white matter. There is old subcortical infarction in the right frontoparietal region and in the left parietal region. There is some hemosiderin deposition in those areas related to the old infarctions. No evidence of mass lesion, acute hemorrhage, hydrocephalus or extra-axial collection. No pituitary mass. No inflammatory sinus disease.  MRA HEAD FINDINGS  There is chronic right internal carotid artery occlusion. The left internal carotid artery is widely patent. There is some atherosclerotic irregularity in the carotid siphon region. The left internal carotid arteries supplies the left anterior and middle cerebral artery territories and through a patent anterior communicating artery, the right anterior and middle cerebral artery territories. Distal branch vessels do show some atherosclerotic irregularity.  The right vertebral artery is a large vessel widely patent to the basilar. There is reconstituted flow in the left vertebral artery. No basilar stenosis. Posterior circulation branch vessels are patent. No flow is seen in the right posterior cerebral artery. This is a new finding compared to previous examinations. However, no infarction is noted in the right PCA territory.   IMPRESSION: Sub  cm acute infarction within the left cerebellum. No other acute infarction. Extensive old ischemic changes throughout the hemispheric white matter with watershed infarction pattern on the right.  Chronic right internal carotid artery occlusion. Anterior circulation supply occurs via a widely patent left internal carotid artery with patent anterior communicating artery.  Widely patent dominant right vertebral artery. Reconstituted flow in the left vertebral artery.  No flow demonstrated in the right posterior cerebral artery on today's study. This is new since the previous exam. However, no infarction is seen in the right PCA territory as might be expected.   Electronically Signed   By: Nelson Chimes M.D.   On: 04/08/2013 19:58   Dg Chest Port 1 View  04/08/2013   CLINICAL DATA:  Stroke.  Productive cough.  EXAM: PORTABLE CHEST - 1 VIEW  COMPARISON:  DG CHEST 2 VIEW dated 01/16/2013; DG CHEST 2 VIEW dated 10/01/2012  FINDINGS: Cardiopericardial silhouette within normal limits. Mediastinal contours normal. Trachea midline. No airspace disease or effusion. Basilar atelectasis is present, prominent on the left but similar to prior. Monitoring leads project over the chest.  IMPRESSION: No acute cardiopulmonary disease.   Electronically Signed   By: Dereck Ligas M.D.   On: 04/08/2013 23:58         Subjective: Patient states her left upper extremity weakness is no worse than usual. Denies any headache, visual disturbance, chest pain, shortness breath, nausea, vomiting or diarrhea, vomiting, dysuria, hematuria. No rashes.   Objective: Filed Vitals:   04/09/13 2311 04/10/13 0630 04/10/13 0643 04/10/13 0744  BP: 157/73 193/92 174/74 175/73  Pulse: 87 78 84 99  Temp: 98.7 F (37.1 C) 98.4 F (36.9 C)    TempSrc: Oral Axillary    Resp:  18    Height:      Weight:      SpO2: 96% 96%  97%    Intake/Output Summary (Last 24 hours) at 04/10/13 0758 Last data filed at 04/09/13 1700  Gross  per 24 hour  Intake    360 ml  Output      0 ml  Net    360 ml   Weight change:  Exam:   General:  Pt is alert, follows commands appropriately, not in acute distress  HEENT: No icterus, No thrush,  [place]/AT; bilateral carotid bruits  Cardiovascular: RRR, S1/S2, no rubs, no gallops  Respiratory: CTA bilaterally, no wheezing, no crackles, no rhonchi  Abdomen: Soft/+BS, non tender, non distended, no guarding  Extremities: trace LE edema, No lymphangitis, No petechiae, No rashes, no synovitis  Neuro:CNII-XII intact, strength 4-/5 LUE/LLE, 4/5 RUE/RLE; no dysmetria. Sensation intact b/l.   Data Reviewed: Basic Metabolic Panel:  Recent Labs Lab 04/08/13 1510 04/08/13 1522 04/09/13 0725  NA 139 141 143  K 4.4 4.1 3.7  CL 101 103 103  CO2 24  --  25  GLUCOSE 114* 114* 134*  BUN 29* 29* 23  CREATININE 1.69* 1.90* 1.40*  CALCIUM 8.8  --  8.8   Liver Function Tests:  Recent Labs Lab 04/08/13 1510  AST 16  ALT 19  ALKPHOS 80  BILITOT 0.3  PROT 7.7  ALBUMIN 3.6   No results found for this basename: LIPASE, AMYLASE,  in the last 168 hours No results found for this basename: AMMONIA,  in the last 168 hours CBC:  Recent Labs Lab 04/08/13 1510 04/08/13 1522 04/09/13 0725  WBC 9.8  --  9.5  NEUTROABS 5.9  --   --   HGB 11.5* 11.9*  11.1*  HCT 34.9* 35.0* 33.9*  MCV 85.5  --  85.4  PLT 300  --  269   Cardiac Enzymes:  Recent Labs Lab 04/08/13 1510  TROPONINI <0.30   BNP: No components found with this basename: POCBNP,  CBG:  Recent Labs Lab 04/09/13 0638 04/09/13 1133 04/09/13 1629 04/09/13 2302 04/10/13 0706  GLUCAP 148* 157* 135* 127* 126*    No results found for this or any previous visit (from the past 240 hour(s)).   Scheduled Meds: . amLODipine  10 mg Oral Daily  . atenolol  25 mg Oral BID  . cloNIDine  0.3 mg Transdermal Q Tue  . enoxaparin (LOVENOX) injection  40 mg Subcutaneous QHS  . fesoterodine  8 mg Oral Daily  . hydrALAZINE  50  mg Oral Q8H  . insulin aspart  0-5 Units Subcutaneous QHS  . insulin aspart  0-9 Units Subcutaneous TID WC  . Placebo/Aspirin - - Acetylsalicylic acid  3 tablet Oral Once  . Placebo/Aspirin-Acetylsalicylic acid  1 tablet Oral Daily  . Placebo/Ticagrelor  1 tablet Oral BID  . Placebo/Ticagrelor  2 tablet Oral Once  . simvastatin  10 mg Oral q1800   Continuous Infusions: . sodium chloride 10 mL/hr (04/09/13 2155)     Donielle Radziewicz, DO  [facility] Pager [phone]  If 7PM-7AM, please contact night-coverage [email or web address] Password [email or web address] 04/10/2013, 7:58 AM   LOS: 2 days

## 2013-04-10 NOTE — Progress Notes (Signed)
I had initially called PT to come reassess patient this afternoon at the request of Dr Tat, however when rounding Dr Leonie Man wanted bedrest on this patient today.I let PT know

## 2013-04-10 NOTE — Progress Notes (Signed)
Dr Tat said we could give scheduled hydralazine with BP 157/51

## 2013-04-10 NOTE — Progress Notes (Signed)
PT Cancellation Note  Patient Details Name: Kelli [name] MRN: [id number] DOB: 10-Dec-1950   Cancelled Treatment:    Reason Eval/Treat Not Completed: Patient at procedure or test/unavailable.  Per RN report pt with possible worsening of neurological sx and now at CT.  Also noted pt BP continues to be elevated.  Will hold PT eval at this time.  Please advise if pt appropriate for mobility.     Quinnlan Abruzzo, Thornton Papas 04/10/2013, 8:39 AM

## 2013-04-10 NOTE — Progress Notes (Signed)
  Echocardiogram 2D Echocardiogram has been performed.  Landry Mellow, RDMS, RVT  04/10/2013, 9:35 AM

## 2013-04-11 LAB — GLUCOSE, CAPILLARY
GLUCOSE-CAPILLARY: 127 mg/dL — AB (ref 70–99)
GLUCOSE-CAPILLARY: 146 mg/dL — AB (ref 70–99)
Glucose-Capillary: 128 mg/dL — ABNORMAL HIGH (ref 70–99)
Glucose-Capillary: 100 mg/dL — ABNORMAL HIGH (ref 70–99)

## 2013-04-11 MED ORDER — PREDNISONE 20 MG PO TABS: 40.0000 mg | ORAL_TABLET | Freq: Every day | ORAL | Status: DC

## 2013-04-11 MED ORDER — METHYLPREDNISOLONE SODIUM SUCC 125 MG IJ SOLR: 60.0000 mg | Freq: Once | INTRAMUSCULAR | Status: DC

## 2013-04-11 MED ORDER — HYDRALAZINE HCL 50 MG PO TABS
75.0000 mg | ORAL_TABLET | Freq: Three times a day (TID) | ORAL | Status: DC
  Administered 2013-04-11 – 2013-04-15 (×11): 75 mg via ORAL
  Filled 2013-04-11 (×16): qty 1

## 2013-04-11 NOTE — Progress Notes (Addendum)
Speech Language Pathology Treatment: Dysphagia  Patient Details Name: AANIYA MACZKA MRN: [id number] DOB: February 17, 1951 Today's Date: 04/11/2013 Time: [id number] SLP Time Calculation (min): 38 min  Assessment / Plan / Recommendation Clinical Impression  Pt with continued decreased LOA today requiring cues to open eyes but she does not maintain eye opening.  Pt continues with high aspiration/malnutrition risk due to lethargy.  She reports less coughing than on previous diet but admits to displeasure with pureed items.  SLP observed pt with nectar thick juice, eggs, and magic cup.  Poor oral manipulation with delayed oral transiting and decreased labial seal due to weakness/lethargy. SLP orally suctioned eggs but was unable to clear.  Weak cough noted with pt consuming nectar via cup and after eggs.  Pt with improved tolerance of magic cup and nectar VIA TSP only - delayed swallow ongoing however.  She benefited from maximum verbal/visual cues to maintain alertness, initiate swallow, clear oral cavity, cough strongly.  Delayed responses noted from pt today - which was consistent with yesterday's performance.   SLP educated pt and nursing student in room to recommendations, use of oral suction and aspiration precautions.   Using teach back, pt expressed understanding of ramifications of aspiration (pna) and indications of dysphagia/aspiration.   Recommend consider MBS when pt with improved LOA if dysphagia symptoms persist.  Question if would benefit from short term alternative means of nutrition if level of alertness does not improve.    Pt states she is not sleeping well due to noise in hall and had problems with fatigue prior to admission.       HPI HPI: VICTORA SPITTLER is an 63 y.o. female a history of hypertension, hyperlipidemia, watershed cerebral infarctions, right ICA occlusion and left CEA, presenting with new onset vertigo with nausea and vomiting as well as numbness involving left side of face and  left upper extremity and left upper extremity weakness. Onset of symptoms was at 2:30 PM today 04/08/2013. Patient has been taking aspirin 81 mg per day per chart review.  She presented to the emergency room [facility]. She was noted initially deemed a candidate for TPA. However, she became worse with increasing nausea and vomiting as well as vertigo and worsening of numbness and left-sided weakness. Speech also became somewhat slurred. CTA was recommended by Specialist on-Call neurologist. She was subsequently transferred here for MRI and MRA study to rule out basal artery thrombosis. MRI showed findings consistent with acute small left cerebellar ischemic infarction. There is no evidence of basilar artery occlusion or significant stenosis. No evidence of thrombus was seen. NIH stroke score on arrival to [facility] was 5. Patient had dark colored emesis which was guaiac positive. Patient was not administerd TPA.  Pt had speech evaluation yesterday and was noted to be coughing with intake, which pt states has occured since hospitalization.  Bedside evaluation completed yesterday and diet modified to puree/nectar.  Poor intake ongoing due to dysphagia, lethargy and displeasure with po diet.     Pertinent Vitals Low grade fever, decreased  SLP Plan  Continue with current plan of care;MBS (MBS when pt more alert, ? Thursday am ?)    Recommendations Diet recommendations: Dysphagia 1 (puree);Nectar-thick liquid (single ice chips after oral care when fully alert) Liquids provided via: Teaspoon Medication Administration: Crushed with puree Supervision: Full supervision/cueing for compensatory strategies;Patient able to self feed Compensations: Slow rate;Small sips/bites;Check for pocketing (oral suction after intake) Postural Changes and/or Swallow Maneuvers: Seated upright 90 degrees;Upright 30-60  min after meal              Oral Care Recommendations: Oral care BID Follow up  Recommendations: Skilled Nursing facility Plan: Continue with current plan of care;MBS (MBS when pt more alert, ? Thursday am ?)    [place], [place] SLP [phone]

## 2013-04-11 NOTE — Progress Notes (Signed)
Rehab Admissions Coordinator Note:  Patient was screened by Retta Diones for appropriateness for an Inpatient Acute Rehab Consult.  At this time, we are recommending Inpatient Rehab consult.  Retta Diones 04/11/2013, 3:47 PM  I can be reached at [phone].

## 2013-04-11 NOTE — Plan of Care (Signed)
Spoke with pt husband in regards to socrates trail. Pt refused meds this am and states her family does not want her to be part of the trial anymore. Wes (in charge of socrates trial) called and husband number given to Wes to call pt husband to answer all concerns and questions.

## 2013-04-11 NOTE — Progress Notes (Signed)
[facility] PROGRESS NOTE  Kelli [name] [id number] DOB: 1950/10/08 DOA: 04/08/2013 PCP: Manon Hilding, MD  Assessment/Plan: #1Acute left cerebellar stroke  -History of left carotid endarterectomy  -MRI brain shows no flow in the right PCA, but not associated with infarct in this territory  -LDL 90--patient was on simvastatin 10 mg prior to admission   2 d echocardiogram with no source of emboli. Carotid Dopplers with no significant ICA stenosis. -PT evaluation  -Urine drug screen and alcohol level negative  -Repeat CT brain 04/10/13 due to RN report of worsen LUE/LLE weak is negative for any acute infarct. -neurology following and recommend continuing Socrates trial. Family concerned that commonly causing some of patient's symptoms. Will refer RN to have a stroke study coordinator speak with the family. Neuro following and appreciate input and recommendations. Hypertension, poorly controlled  -Increase hydralazine to 75 mg 3 times a day  -continue atenolol to 25 mg twice a day  -Continue clonidine TTS 3  -Continue amlodipine 10 mg daily  Impaired glucose tolerance  -Hemoglobin A1c 6.4  -Lifestyle modification  Prior renal artery duplex negative for renal artery stenosis-  CKD stage III  -Review of the records reveals large variation and baseline serum creatinine  -varies from 1.4-1.9  -Urinalysis is bland  Positive gastro-occult  -Hemoglobin has remained stable  -Continue to monitor  -Patient denies any abdominal pain, tolerating diet  History of tobacco use  -Patient quit one year ago  Anxiety disorder/fibromyalgia  -Continue Klonopin 0.5 mg twice a day    Code Status: full Family Communication: updated patient and daughter at bedside. Disposition Plan: CIR vs SNF   Consultants:  Neurology: Dr. Nicole Kindred 04/08/2013  Procedures:  CT head 04/10/2013  MRI MRA of the head 04/08/2013  2-D echo 04/10/2013  Carotid Dopplers 04/10/2013  Chest x-ray 04/08/2013,  04/09/2013  Antibiotics:  none  HPI/Subjective: Patient sleepy and lethargic per family. Patient now opens eyes and follows commands and answering questions appropriately.  Objective: Filed Vitals:   04/11/13 0941  BP: 158/61  Pulse: 60  Temp: 98.1 F (36.7 C)  Resp: 18    Intake/Output Summary (Last 24 hours) at 04/11/13 1219 Last data filed at 04/11/13 [id number]  Gross per 24 hour  Intake    420 ml  Output      2 ml  Net    418 ml   Filed Weights   04/08/13 1507 04/08/13 2115  Weight: 97.523 kg (215 lb) 98.748 kg (217 lb 11.2 oz)    Exam:   General:  Patient sleepy however answers questions appropriately and following commands.  Cardiovascular: regular rate and rhythm  Respiratory: clear to auscultation bilaterally  Abdomen: soft, nontender, nondistended, positive bowel sounds  Musculoskeletal: no clubbing cyanosis or edema  Data Reviewed: Basic Metabolic Panel:  Recent Labs Lab 04/08/13 1510 04/08/13 1522 04/09/13 0725 04/10/13 0940  NA 139 141 143 141  K 4.4 4.1 3.7 4.6  CL 101 103 103 104  CO2 24  --  25 23  GLUCOSE 114* 114* 134* 186*  BUN 29* 29* 23 24*  CREATININE 1.69* 1.90* 1.40* 1.45*  CALCIUM 8.8  --  8.8 8.6   Liver Function Tests:  Recent Labs Lab 04/08/13 1510 04/10/13 0940  AST 16 44*  ALT 19 16  ALKPHOS 80 65  BILITOT 0.3 0.4  PROT 7.7 7.1  ALBUMIN 3.6 3.3*   No results found for this basename: LIPASE, AMYLASE,  in the last 168 hours No results found for this basename:  AMMONIA,  in the last 168 hours CBC:  Recent Labs Lab 04/08/13 1510 04/08/13 1522 04/09/13 0725 04/10/13 0940  WBC 9.8  --  9.5 11.0*  NEUTROABS 5.9  --   --   --   HGB 11.5* 11.9* 11.1* 11.4*  HCT 34.9* 35.0* 33.9* 34.1*  MCV 85.5  --  85.4 85.3  PLT 300  --  269 276   Cardiac Enzymes:  Recent Labs Lab 04/08/13 1510  TROPONINI <0.30   BNP (last 3 results) No results found for this basename: PROBNP,  in the last 8760 hours CBG:  Recent  Labs Lab 04/10/13 0706 04/10/13 1157 04/10/13 1644 04/10/13 2154 04/11/13 0711  GLUCAP 126* 137* 121* 114* 127*    No results found for this or any previous visit (from the past 240 hour(s)).   Studies: Ct Head Wo Contrast  04/10/2013   CLINICAL DATA:  Left upper and left lower extremity weakness  EXAM: CT HEAD WITHOUT CONTRAST  TECHNIQUE: Contiguous axial images were obtained from the base of the skull through the vertex without intravenous contrast. Study was obtained within 24 hr of patient's arrival at the emergency department.  COMPARISON:  Brain CT and brain MRI April 08, 2013  FINDINGS: There is mild diffuse atrophy, stable. There is no demonstrable mass, hemorrhage, extra-axial fluid collection, or midline shift. The known acute cerebellar infarct on the left is better defined on this study compared to recent prior studies. There is extensive small vessel disease throughout the centra semiovale bilaterally. There is evidence of a prior small infarct in the medial left parietal lobe. There is no new infarct appreciable compared to 2 days prior.  Bony calvarium appears intact.  The mastoid air cells are clear.  IMPRESSION: The known recent left cerebellar infarct is better define currently than on studies obtained 2 days prior. There is extensive supratentorial small vessel disease as well as a prior small left parietal lobe infarct. No convincing new infarct is seen in these areas, although a small infarct could easily be obscured on CT by the degree of underlying small vessel disease. There is no acute hemorrhage or mass effect. The middle cerebral arteries do not show increased attenuation on either side.   Electronically Signed   By: Lowella Grip M.D.   On: 04/10/2013 08:42    Scheduled Meds: . amLODipine  10 mg Oral Daily  . atenolol  25 mg Oral BID  . cloNIDine  0.3 mg Transdermal Q Tue  . enoxaparin (LOVENOX) injection  40 mg Subcutaneous QHS  . fesoterodine  8 mg Oral Daily   . hydrALAZINE  50 mg Oral 3 times per day  . insulin aspart  0-5 Units Subcutaneous QHS  . insulin aspart  0-9 Units Subcutaneous TID WC  . methylPREDNISolone (SOLU-MEDROL) injection  60 mg Intravenous Once  . Placebo/Aspirin-Acetylsalicylic acid  1 tablet Oral Daily  . Placebo/Ticagrelor  1 tablet Oral BID  . [START ON 04/12/2013] predniSONE  40 mg Oral QAC breakfast  . simvastatin  10 mg Oral q1800   Continuous Infusions: . sodium chloride 50 mL/hr at 04/10/13 1253    Principal Problem:   Acute ischemic stroke Active Problems:   Hypertension, uncontrolled   Occlusion and stenosis of carotid artery with cerebral infarction   Anxiety state, unspecified   CKD (chronic kidney disease) stage 3, GFR 30-59 ml/min   Depression   Diplopia   Left-sided weakness   Vertigo   Paresthesias in left hand   Pre-diabetes  Cerebellar stroke   Dizziness and giddiness   Stroke    Time spent: [place] MD [facility] Pager [phone]. If 7PM-7AM, please contact night-coverage at [email or web address], password [facility] 04/11/2013, 12:19 PM  LOS: 3 days

## 2013-04-11 NOTE — Progress Notes (Signed)
[name] son Eileen Galvao expressed his dislike sending patient to a nursing home.

## 2013-04-11 NOTE — Evaluation (Signed)
Physical Therapy Evaluation Patient Details Name: Kelli [name] MRN: [id number] DOB: [birth year] Today's Date: 04/11/2013 Time: 1128-1203 PT Time Calculation (min): 35 min  PT Assessment / Plan / Recommendation History of Present Illness  Kelli [name] is an 63 y.o. female a history of hypertension, hyperlipidemia, watershed cerebral infarctions, right ICA occlusion and left CEA, presenting with new onset vertigo with nausea and vomiting as well as numbness involving left side of face and left upper extremity and left upper extremity weakness. MRI showed findings consistent with acute small left cerebellar ischemic infarction  Clinical Impression  Pt with significant mobility limitations and at this time requiring 2 person A to complete all mobility tasks.  Pt went to CIR at her last CVA and pt adn family are interested in pt returning to CIR after this CVA.      PT Assessment  Patient needs continued PT services    Follow Up Recommendations  CIR    Does the patient have the potential to tolerate intense rehabilitation      Barriers to Discharge        Equipment Recommendations   (TBD)    Recommendations for Other Services Rehab consult   Frequency Min 4X/week    Precautions / Restrictions Precautions Precautions: Fall Restrictions Weight Bearing Restrictions: No   Pertinent Vitals/Pain Denied pain.        Mobility  Bed Mobility Overal bed mobility: Needs Assistance Bed Mobility: Rolling;Sidelying to Sit Rolling: Max assist Sidelying to sit: Max assist;+2 for physical assistance General bed mobility comments: cues for use of R UE to A with mobility.  pt tends to lean to R side and difficulty finding midline once sitting.   Transfers Overall transfer level: Needs assistance Equipment used: 2 person hand held assist Transfers: Sit to/from W. Kelli [name] Sit to Stand: Max assist;+2 physical assistance Squat pivot transfers: Total assist;+2 physical  assistance General transfer comment: pt needs cueing and facilitation for anterior wt shift with coming to stand, blocking L knee.  pt unable to bear any wt on L LE as LE buckles easily.  Utilized pad under hips to complet squat transfer.   Modified Rankin (Stroke Patients Only) Pre-Morbid Rankin Score: Slight disability Modified Rankin: Severe disability    Exercises     PT Diagnosis: Difficulty walking;Hemiplegia non-dominant side  PT Problem List: Decreased strength;Decreased activity tolerance;Decreased balance;Decreased mobility;Decreased coordination;Decreased cognition;Decreased knowledge of use of DME;Decreased safety awareness;Obesity PT Treatment Interventions: DME instruction;Gait training;Functional mobility training;Therapeutic activities;Therapeutic exercise;Balance training;Neuromuscular re-education;Cognitive remediation;Patient/family education     PT Goals(Current goals can be found in the care plan section) Acute Rehab PT Goals Patient Stated Goal: to get better PT Goal Formulation: With patient/family Time For Goal Achievement: 04/25/13 Potential to Achieve Goals: Good  Visit Information  Last PT Received On: 04/11/13 Assistance Needed: +2 History of Present Illness: Kelli [name] is an 63 y.o. female a history of hypertension, hyperlipidemia, watershed cerebral infarctions, right ICA occlusion and left CEA, presenting with new onset vertigo with nausea and vomiting as well as numbness involving left side of face and left upper extremity and left upper extremity weakness. MRI showed findings consistent with acute small left cerebellar ischemic infarction       Prior Functioning  Home Living Family/patient expects to be discharged to:: Private residence Living Arrangements: Spouse/significant other;Other relatives Available Help at Discharge: Family Type of Home: House Home Access: Stairs to enter [employer] of Steps: 3 Entrance Stairs-Rails:  None Home Layout: Two level;Able to live on  main level with bedroom/bathroom Alternate Level Stairs-Number of Steps: flight Alternate Level Stairs-Rails: Right Home Equipment: Shower seat  Lives With: Spouse Prior Function Level of Independence: Independent Comments: Engineer, manufacturing systems, gardening, art, nature.  Pt reports she does not drive Communication Communication: No difficulties Dominant Hand: Right    Cognition  Cognition Arousal/Alertness: Lethargic Behavior During Therapy: Flat affect Overall Cognitive Status: Impaired/Different from baseline Area of Impairment: Attention;Following commands;Problem solving Current Attention Level: Sustained Following Commands: Follows one step commands consistently Problem Solving: Slow processing;Decreased initiation;Requires verbal cues;Requires tactile cues;Difficulty sequencing General Comments: Pt very lethargic throughout.  She will answer questions and converse, but keeps eyes closed.  At times she will drift off to sleep, but arrouses again fairly easily to answer questions.  She will consistently follow one step commands, but unable to sustain attention to complete multi step commands     Extremity/Trunk Assessment Upper Extremity Assessment Upper Extremity Assessment: Defer to OT evaluation LUE Deficits / Details: weakness noted throughout.  She is able to initiate shoulder flexion, and elbow extension with cues to maintain arrousal.  Unable to complete movement.  Does not use Lt. UE functionally.  PROM WFL LUE Coordination: decreased fine motor;decreased gross motor Lower Extremity Assessment Lower Extremity Assessment: LLE deficits/detail LLE Deficits / Details: Generally weak.  Able to actively move LE with strength grossly 2/5 LLE Coordination: decreased fine motor;decreased gross motor Cervical / Trunk Assessment Cervical / Trunk Assessment: Other exceptions Cervical / Trunk Exceptions: keeps head neck laterally flexed to  Rt.    Balance Balance Overall balance assessment: Needs assistance Sitting-balance support: Single extremity supported;Feet supported Sitting balance-Leahy Scale: Poor  End of Session PT - End of Session Equipment Utilized During Treatment: Gait belt Activity Tolerance: Patient tolerated treatment well Patient left: in chair;with call bell/phone within reach;with chair alarm set;with family/visitor present Nurse Communication: Mobility status;Need for lift equipment  GP     Catarina Hartshorn, [place] 04/11/2013, 3:08 PM

## 2013-04-11 NOTE — Progress Notes (Signed)
Stroke Team Progress Note  HISTORY Kelli [name] is an 63 y.o. female a history of hypertension, hyperlipidemia, watershed cerebral infarctions, right ICA occlusion and left CEA, presenting with new onset vertigo with nausea and vomiting as well as numbness involving left side of face and left upper extremity and left upper extremity weakness. Onset of symptoms was at 2:30 PM today 04/08/2013. Patient has been taking aspirin 81 mg per day. She presented to the emergency room at [facility] initially. She was noted initially deemed a candidate for TPA. However, she became worse with increasing nausea and vomiting as well as vertigo and worsening of numbness and left-sided weakness. Speech also became somewhat slurred. CTA was recommended by Specialist on-Call neurologist. Study could not be done however because of patient's elevated creatinine. She was subsequently transferred here for MRI and MRA study to rule out basal artery thrombosis. MRI showed findings consistent with acute small left cerebellar ischemic infarction. There is no evidence of basilar artery occlusion or significant stenosis. No evidence of thrombus was seen. NIH stroke score on arrival to [facility] was 5. Patient had dark colored emesis which was guaiac positive. Patient was not administerd TPA.  SUBJECTIVE Patient sleeping soundly in bed on arrival. She did not sleep at all last night per pt and nurse. No family present.  OBJECTIVE Most recent Vital Signs: Filed Vitals:   04/11/13 0003 04/11/13 0221 04/11/13 0552 04/11/13 0941  BP: 170/55 184/71 168/60 158/61  Pulse: 70 73 75 60  Temp:  98.7 F (37.1 C) 98.5 F (36.9 C)   TempSrc:  Oral Oral   Resp:  20 20   Height:      Weight:      SpO2:  96% 98%    CBG (last 3)   Recent Labs  04/10/13 1644 04/10/13 2154 04/11/13 0711  GLUCAP 121* 114* 127*    IV Fluid Intake:   . sodium chloride 50 mL/hr at 04/10/13 1253    MEDICATIONS  . amLODipine  10 mg Oral Daily   . atenolol  25 mg Oral BID  . cloNIDine  0.3 mg Transdermal Q Tue  . enoxaparin (LOVENOX) injection  40 mg Subcutaneous QHS  . fesoterodine  8 mg Oral Daily  . hydrALAZINE  50 mg Oral 3 times per day  . insulin aspart  0-5 Units Subcutaneous QHS  . insulin aspart  0-9 Units Subcutaneous TID WC  . Placebo/Aspirin-Acetylsalicylic acid  1 tablet Oral Daily  . Placebo/Ticagrelor  1 tablet Oral BID  . simvastatin  10 mg Oral q1800   PRN:  acetaminophen, acetaminophen, clonazePAM, hydrALAZINE, ondansetron (ZOFRAN) IV, prochlorperazine, RESOURCE THICKENUP CLEAR, senna-docusate, sodium chloride  Diet:  Dysphagia thin liquids Activity:  As tolerated DVT Prophylaxis:  Lovenox 40 mg sq daily   CLINICALLY SIGNIFICANT STUDIES Basic Metabolic Panel:   Recent Labs Lab 04/09/13 0725 04/10/13 0940  NA 143 141  K 3.7 4.6  CL 103 104  CO2 25 23  GLUCOSE 134* 186*  BUN 23 24*  CREATININE 1.40* 1.45*  CALCIUM 8.8 8.6   Liver Function Tests:   Recent Labs Lab 04/08/13 1510 04/10/13 0940  AST 16 44*  ALT 19 16  ALKPHOS 80 65  BILITOT 0.3 0.4  PROT 7.7 7.1  ALBUMIN 3.6 3.3*   CBC:   Recent Labs Lab 04/08/13 1510  04/09/13 0725 04/10/13 0940  WBC 9.8  --  9.5 11.0*  NEUTROABS 5.9  --   --   --   HGB 11.5*  < >  11.1* 11.4*  HCT 34.9*  < > 33.9* 34.1*  MCV 85.5  --  85.4 85.3  PLT 300  --  269 276  < > = values in this interval not displayed. Coagulation:   Recent Labs Lab 04/08/13 1510  LABPROT 12.7  INR 0.97   Cardiac Enzymes:   Recent Labs Lab 04/08/13 1510  TROPONINI <0.30   Urinalysis:   Recent Labs Lab 04/08/13 1623  COLORURINE YELLOW  LABSPEC 1.020  PHURINE 7.0  GLUCOSEU NEGATIVE  HGBUR NEGATIVE  BILIRUBINUR NEGATIVE  KETONESUR NEGATIVE  PROTEINUR 100*  UROBILINOGEN 0.2  NITRITE NEGATIVE  LEUKOCYTESUR NEGATIVE   Lipid Panel    Component Value Date/Time   CHOL 171 04/09/2013 0725   TRIG 241* 04/09/2013 0725   HDL 33* 04/09/2013 0725   CHOLHDL  5.2 04/09/2013 0725   VLDL 48* 04/09/2013 0725   LDLCALC 90 04/09/2013 0725   HgbA1C  Lab Results  Component Value Date   HGBA1C 6.4* 04/09/2013    Urine Drug Screen:     Component Value Date/Time   LABOPIA NONE DETECTED 04/08/2013 1624   COCAINSCRNUR NONE DETECTED 04/08/2013 1624   LABBENZ NONE DETECTED 04/08/2013 1624   AMPHETMU NONE DETECTED 04/08/2013 1624   THCU NONE DETECTED 04/08/2013 1624   LABBARB NONE DETECTED 04/08/2013 1624    Alcohol Level:   Recent Labs Lab 04/08/13 1510  ETH <11   CT of the brain   04/10/2013 The known recent left cerebellar infarct is better define currently than on studies obtained 2 days prior. There is extensive  supratentorial small vessel disease as well as a prior small left parietal lobe infarct. No convincing new infarct is seen in these areas, although a small infarct could easily be obscured on CT by the degree of underlying small vessel disease. There is no acute hemorrhage or mass effect. The middle cerebral arteries do not show increased attenuation on either side. 04/08/2013   : 1. No acute abnormality. 2. Stable chronic small vessel white matter ischemic changes and old lacunar infarcts.   MRI of the brain  04/08/2013  Sub cm acute infarction within the left cerebellum. No other acute infarction. Extensive old ischemic changes throughout the hemispheric white matter with watershed infarction pattern on the right.    MRA of the brain  04/08/2013  Chronic right internal carotid artery occlusion. Anterior circulation supply occurs via a widely patent left internal carotid artery with patent anterior communicating artery.  Widely patent dominant right vertebral artery. Reconstituted flow in the left vertebral artery.  No flow demonstrated in the right posterior cerebral artery on today's study. This is new since the previous exam. However, no infarction is seen in the right PCA territory as might be expected.    2D Echocardiogram  EF 55-60% with no source of  embolus.   Carotid Doppler  Left: CEA patent. 1-39% ICA stenosis. Right: Vertebral artery flow is antegrade. Left: Vertebral artery flow is retrograde.   CXR   04/09/2013 No acute cardiopulmonary disease. 04/08/2013  No acute cardiopulmonary disease.     EKG  normal sinus rhythm. For complete results please see formal report.   Therapy Recommendations   Physical Exam   Middle aged Caucasian lady  In mild distress due to nausea and vomitting. t. Afebrile. Head is nontraumatic. Neck is supple without bruit. Hearing is normal. Cardiac exam no murmur or gallop. Lungs are clear to auscultation. Distal pulses are well felt. Neurological Exam : Drowsy but opens eyes easily and follows  commands. Mild dysarthria no aphasia. Extraocular movements are full range without nystagmus. Vision acuity seems adequate. Fundi were not visualized. No nystagmus. Mild left lower facial asymmetry. Tongue midline. Motor system exam reveals severe LUE drift and weakness of left grip and intrinsic hand muscles. No lower extremity drift but mild weakness of ankle dorsiflexors and hip flexors. Finger to nose and knee to the coordination is slow but accurate. Sensation is preserved. Gait was not tested.  NIH stroke scale 4 - 1 dysarthria, 1 face, 2 for arm  ASSESSMENT Kelli [name] is a 63 y.o. female presenting with left sided numbness and weakness, progressing to nausea and vomiting. Imaging confirms a left cerebellar infarct. Infarct felt to be thrombotic secondary to small vessel disease.  Waxing and waning of left hemiparesis continues - no neuro or medical etiology for lethargy CT unchanged yesterday. Lethargy and left hemiparesis worsening may be related to lack of sleep. On aspirin 81 mg orally every day prior to admission. Now  enrolled in Socrates, ticagrelor vs aspirin for secondary stroke prevention. Patient with resultantmild LUE weakness, nausea and vomiting. Work up completed.    Nausea and vomiting, lethargic  post phenergan, remains lethargic now. On arrival, [name] emesis was gastric occult positive, however no overt bleeding was noted. Hypertension - recently stopped labetalol and switched to atenolol at pt request Hyperlipidemia, LDL 126, on no statin PTA, now on statin, goal LDL < 100 - Now on Zocor. Chronic kidney disease, stage 3 - follow up with nephrology planned in March. Holding off ARB or ACE Depression - hx of. Dr. Domenic Polite concerned it may need further management Fibromyalgia  Cigarette smoker  Obesity, Body mass index is 36.23 kg/(m^2).  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 S/p L CEA on 10/03/2012 by Dr. Donnetta Hutching. No hx heart failure obstructive sleep apnea - needs formal dx  Patient reports she did not sleep at all last night. RN concurs  Patient states her family does not want her to continue with the Socrates study. She does not have an opinion what she wants to do at this time. Discussed with her. Referred RN to stroke study coordinator.  Hospital day # 3  TREATMENT/PLAN  Continue Socrates trial, ticagrelor vs aspirin for secondary stroke prevention at this time. Defer continuation to pt and research coordinator   [place] to be OOB with therapy  No indication to repeat CT at this time  Will follow for disposition recommendations.  Burnetta Sabin, MSN, RN, ANVP-BC, ANP-BC, [facility] Pager: [phone] 04/11/2013 10:12 AM  I have personally obtained a history, examined the patient, evaluated imaging results, and formulated the assessment and plan of care. I agree with the above.  Jim Like, DO Neurology-Stroke

## 2013-04-11 NOTE — Plan of Care (Signed)
Spoke with Wes (Socrates trial person) and he said he spoke with pts husband and sharon biby. Will hold placebo meds for two days and give regular aspirin. Burnetta Sabin will put orders in.

## 2013-04-11 NOTE — Consult Note (Signed)
Kelli [name], Student nurse [facility] [employer] [place], [place]

## 2013-04-11 NOTE — Evaluation (Signed)
Occupational Therapy Evaluation Patient Details Name: Kelli [name] MRN: [id number] DOB: 1951-01-21 Today's Date: 04/11/2013 Time: [id number] OT Time Calculation (min): 13 min  OT Assessment / Plan / Recommendation History of present illness [name] is an 63 y.o. female a history of hypertension, hyperlipidemia, watershed cerebral infarctions, right ICA occlusion and left CEA, presenting with new onset vertigo with nausea and vomiting as well as numbness involving left side of face and left upper extremity and left upper extremity weakness. MRI showed findings consistent with acute small left cerebellar ischemic infarction   Clinical Impression   Pt admitted with above.  She demonstrates the below listed deficits and will benefit from continued OT to maximize safety and independence with BADLs.  Pt. Continues with significant lethargy and unable to sustain arousal.  Currently, she requires max A to total A for BADLs due to decreased arousal.  She will converse appropriately, and is able to initiate Lt. UE movement.  Once arousal improves, anticipate, she will progress nicely.  Recommend CIR.     OT Assessment  Patient needs continued OT Services    Follow Up Recommendations  [facility]    Barriers to Discharge   unsure level of assist available  Equipment Recommendations  None recommended by OT    Recommendations for Other Services    Frequency  Min 3X/week    Precautions / Restrictions Precautions Precautions: Fall   Pertinent Vitals/Pain     ADL  Eating/Feeding: Other (comment) (not attempted due to decreased arrousal) Grooming: Brushing hair;Supervision/safety Where Assessed - Grooming: Supine, head of bed up Upper Body Bathing: Maximal assistance Where Assessed - Upper Body Bathing: Supine, head of bed up Lower Body Bathing: +1 Total assistance Where Assessed - Lower Body Bathing: Supine, head of bed up;Rolling right and/or left Upper Body Dressing: +1 Total assistance Where  Assessed - Upper Body Dressing: Supine, head of bed up Lower Body Dressing: +1 Total assistance Where Assessed - Lower Body Dressing: Supine, head of bed up;Rolling right and/or left Toilet Transfer: +1 Total assistance (did not attempt due to lethargy) Toileting - Clothing Manipulation and Hygiene: +1 Total assistance Where Assessed - Toileting Clothing Manipulation and Hygiene: Supine, head of bed flat;Rolling right and/or left ADL Comments: Pt too fatigued to fully participate in simple ADL tasks    OT Diagnosis: Generalized weakness;Cognitive deficits;Disturbance of vision;Hemiplegia non-dominant side  OT Problem List: Decreased strength;Decreased activity tolerance;Decreased range of motion;Impaired balance (sitting and/or standing);Impaired vision/perception;Decreased coordination;Decreased cognition;Decreased safety awareness;Decreased knowledge of use of DME or AE;Impaired tone;Impaired UE functional use OT Treatment Interventions: Self-care/ADL training;Neuromuscular education;DME and/or AE instruction;Therapeutic activities;Splinting;Cognitive remediation/compensation;Visual/perceptual remediation/compensation;Patient/family education;Balance training   OT Goals(Current goals can be found in the care plan section) Acute Rehab OT Goals Patient Stated Goal: to get better OT Goal Formulation: With patient Time For Goal Achievement: 04/25/13 Potential to Achieve Goals: Good ADL Goals Pt Will Perform Eating: with supervision;sitting Pt Will Perform Grooming: with supervision;sitting Pt Will Perform Upper Body Bathing: with supervision;sitting Pt Will Perform Lower Body Bathing: with mod assist;sit to/from stand Pt Will Perform Upper Body Dressing: with min assist;sitting Pt Will Perform Lower Body Dressing: with mod assist;sit to/from stand Pt Will Transfer to Toilet: with mod assist;stand pivot transfer;bedside commode Pt Will Perform Toileting - Clothing Manipulation and hygiene:  with mod assist;sit to/from stand Additional ADL Goal #1: Pt will maintain arrousal for 20 mins during ADL session   Visit Information  Last OT Received On: 04/11/13 History of Present Illness: Kelli [name] is an  63 y.o. female a history of hypertension, hyperlipidemia, watershed cerebral infarctions, right ICA occlusion and left CEA, presenting with new onset vertigo with nausea and vomiting as well as numbness involving left side of face and left upper extremity and left upper extremity weakness. MRI showed findings consistent with acute small left cerebellar ischemic infarction       Prior Functioning     Home Living Family/patient expects to be discharged to:: Private residence Living Arrangements: Spouse/significant other;Other relatives Available Help at Discharge: Family Type of Home: House Home Access: Stairs to enter [employer] of Steps: 3 Entrance Stairs-Rails: None Home Layout: Two level;Able to live on main level with bedroom/bathroom Alternate Level Stairs-Number of Steps: flight Alternate Level Stairs-Rails: Right Home Equipment: Shower seat  Lives With: Spouse Prior Function Level of Independence: Independent Comments: Engineer, manufacturing systems, gardening, art, nature.  Pt reports she does not drive Communication Communication: No difficulties Dominant Hand: Right         Vision/Perception Vision - History Baseline Vision: Wears glasses only for reading Visual History: Cataracts (s/p recent cataract removal both eyes) Patient Visual Report: No change from baseline Vision - Assessment Vision Assessment: Vision not tested Additional Comments: Pt unable to maintain adequate eye opening and arrousal to complete visual assessment.  Pt. reports she had diplopia yesterday, but during brief periods of eye opening, during eval, she denies diplopia Perception Perception: Not tested Praxis Praxis: Intact   Cognition  Cognition Arousal/Alertness:  Lethargic Behavior During Therapy: Flat affect Overall Cognitive Status: Impaired/Different from baseline Area of Impairment: Attention;Following commands;Problem solving Current Attention Level: Sustained Following Commands: Follows one step commands consistently Problem Solving: Slow processing;Decreased initiation;Requires verbal cues;Requires tactile cues;Difficulty sequencing General Comments: Pt very lethargic throughout OT eval.  She will answer questions and converse, but keeps eyes closed.  At times she will drift off to sleep, but arrouses again fairly easily to answer questions.  She will consistently follow one step commands, but unable to sustain attention to complete multi step commands     Extremity/Trunk Assessment Upper Extremity Assessment Upper Extremity Assessment: LUE deficits/detail LUE Deficits / Details: weakness noted throughout.  She is able to initiate shoulder flexion, and elbow extension with cues to maintain arrousal.  Unable to complete movement.  Does not use Lt. UE functionally.  PROM WFL LUE Coordination: decreased fine motor;decreased gross motor Lower Extremity Assessment Lower Extremity Assessment: Defer to PT evaluation Cervical / Trunk Assessment Cervical / Trunk Assessment: Other exceptions Cervical / Trunk Exceptions: keeps head neck laterally flexed to Rt.      Mobility Bed Mobility Overal bed mobility: Needs Assistance Bed Mobility: Rolling Rolling: Max assist     Exercise     Balance     End of Session OT - End of Session Activity Tolerance: Patient limited by lethargy Patient left: in bed;with call bell/phone within reach;with bed alarm set Nurse Communication: Mobility status  GO     Lucille Passy M 04/11/2013, 1:49 PM

## 2013-04-11 NOTE — Progress Notes (Signed)
Spoke to [name] sister on phone and she expressed her concern about patient having another stroke in the hospital. Patient condition unchanged from yesterday. Family would like to do repeat MRI.   Sharion Dove / RN, SCRN

## 2013-04-12 ENCOUNTER — Inpatient Hospital Stay (HOSPITAL_COMMUNITY): Payer: 59

## 2013-04-12 DIAGNOSIS — I6330 Cerebral infarction due to thrombosis of unspecified cerebral artery: Secondary | ICD-10-CM

## 2013-04-12 LAB — GLUCOSE, CAPILLARY
Glucose-Capillary: 142 mg/dL — ABNORMAL HIGH (ref 70–99)
Glucose-Capillary: 131 mg/dL — ABNORMAL HIGH (ref 70–99)
Glucose-Capillary: 147 mg/dL — ABNORMAL HIGH (ref 70–99)
Glucose-Capillary: 121 mg/dL — ABNORMAL HIGH (ref 70–99)

## 2013-04-12 MED ORDER — ASPIRIN 325 MG PO TABS: 325.0000 mg | ORAL_TABLET | Freq: Every day | ORAL | Status: DC

## 2013-04-12 MED ORDER — ASPIRIN 325 MG PO TABS
325.0000 mg | ORAL_TABLET | Freq: Every day | ORAL | Status: DC
  Administered 2013-04-12 – 2013-04-17 (×5): 325 mg via ORAL
  Filled 2013-04-12 (×6): qty 1

## 2013-04-12 MED ORDER — ASPIRIN EC 325 MG PO TBEC
325.0000 mg | DELAYED_RELEASE_TABLET | Freq: Every day | ORAL | Status: DC
Start: 2013-04-12 — End: 2013-04-12

## 2013-04-12 NOTE — Progress Notes (Signed)
Rehab admissions - I met with pt and her son to discuss possible inpatient rehab. Discussed the potential need for further support at the end of the rehab stay and pt/family to discuss if they can arrange for this support.   Pt is familiar with acute rehab as she was at [facility] last fall with a previous CVA. They are interested in pursuing this again but acknowledge that they would need to line up support for pt at discharge. If supervison/minimal assistance cannot be arranged for pt at DC from acute rehab, pt may likely need to consider further rehab at skilled nursing. Pt/son are aware of this.   Son/pt requested that I check back tomorrow and will consider opening the case to get approval from [facility] when family has determined the plan. Informational brochures left with pt and son. Questions answered. Please call me with any questions. Thanks.  Nanetta Batty, PT Rehabilitation Admissions Coordinator [phone]

## 2013-04-12 NOTE — Consult Note (Signed)
Jasper Loser [place] [employer] Nursing [employer]/Sonja Vinetta [place]

## 2013-04-12 NOTE — Progress Notes (Signed)
Stroke Team Progress Note  HISTORY Kelli [name] is an 63 y.o. female a history of hypertension, hyperlipidemia, watershed cerebral infarctions, right ICA occlusion and left CEA, presenting with new onset vertigo with nausea and vomiting as well as numbness involving left side of face and left upper extremity and left upper extremity weakness. Onset of symptoms was at 2:30 PM today 04/08/2013. Patient has been taking aspirin 81 mg per day. She presented to the emergency room at [facility] initially. She was noted initially deemed a candidate for TPA. However, she became worse with increasing nausea and vomiting as well as vertigo and worsening of numbness and left-sided weakness. Speech also became somewhat slurred. CTA was recommended by Specialist on-Call neurologist. Study could not be done however because of patient's elevated creatinine. She was subsequently transferred here for MRI and MRA study to rule out basal artery thrombosis. MRI showed findings consistent with acute small left cerebellar ischemic infarction. There is no evidence of basilar artery occlusion or significant stenosis. No evidence of thrombus was seen. NIH stroke score on arrival to [facility] was 5. Patient had dark colored emesis which was guaiac positive. Patient was not administerd TPA.  SUBJECTIVE Patient resting, no family at bedside. Appears more alert, states she has a headache, dull bifrontal, recently given tylenol by RN. Per RN, no overnight events.   OBJECTIVE Most recent Vital Signs: Filed Vitals:   04/12/13 0527 04/12/13 0558 04/12/13 0700 04/12/13 0940  BP: 152/55 174/66 174/61   Pulse: 77 67 73 69  Temp: 98.6 F (37 C)  97.4 F (36.3 C) 98 F (36.7 C)  TempSrc: Oral  Oral Oral  Resp: 18 20 18 16   Height:      Weight:      SpO2: 100% 96% 97% 96%   CBG (last 3)   Recent Labs  04/11/13 1655 04/11/13 2103 04/12/13 0636  GLUCAP 146* 100* 142*    IV Fluid Intake:      MEDICATIONS  .  amLODipine  10 mg Oral Daily  . atenolol  25 mg Oral BID  . cloNIDine  0.3 mg Transdermal Q Tue  . enoxaparin (LOVENOX) injection  40 mg Subcutaneous QHS  . fesoterodine  8 mg Oral Daily  . hydrALAZINE  75 mg Oral 3 times per day  . insulin aspart  0-5 Units Subcutaneous QHS  . insulin aspart  0-9 Units Subcutaneous TID WC  . Placebo/Aspirin-Acetylsalicylic acid  1 tablet Oral Daily  . Placebo/Ticagrelor  1 tablet Oral BID  . simvastatin  10 mg Oral q1800   PRN:  acetaminophen, acetaminophen, clonazePAM, hydrALAZINE, ondansetron (ZOFRAN) IV, prochlorperazine, RESOURCE THICKENUP CLEAR, senna-docusate, sodium chloride  Diet:  Dysphagia thin liquids Activity:  As tolerated DVT Prophylaxis:  Lovenox 40 mg sq daily   CLINICALLY SIGNIFICANT STUDIES Basic Metabolic Panel:   Recent Labs Lab 04/09/13 0725 04/10/13 0940  NA 143 141  K 3.7 4.6  CL 103 104  CO2 25 23  GLUCOSE 134* 186*  BUN 23 24*  CREATININE 1.40* 1.45*  CALCIUM 8.8 8.6   Liver Function Tests:   Recent Labs Lab 04/08/13 1510 04/10/13 0940  AST 16 44*  ALT 19 16  ALKPHOS 80 65  BILITOT 0.3 0.4  PROT 7.7 7.1  ALBUMIN 3.6 3.3*   CBC:   Recent Labs Lab 04/08/13 1510  04/09/13 0725 04/10/13 0940  WBC 9.8  --  9.5 11.0*  NEUTROABS 5.9  --   --   --   HGB 11.5*  < >  11.1* 11.4*  HCT 34.9*  < > 33.9* 34.1*  MCV 85.5  --  85.4 85.3  PLT 300  --  269 276  < > = values in this interval not displayed. Coagulation:   Recent Labs Lab 04/08/13 1510  LABPROT 12.7  INR 0.97   Cardiac Enzymes:   Recent Labs Lab 04/08/13 1510  TROPONINI <0.30   Urinalysis:   Recent Labs Lab 04/08/13 1623  COLORURINE YELLOW  LABSPEC 1.020  PHURINE 7.0  GLUCOSEU NEGATIVE  HGBUR NEGATIVE  BILIRUBINUR NEGATIVE  KETONESUR NEGATIVE  PROTEINUR 100*  UROBILINOGEN 0.2  NITRITE NEGATIVE  LEUKOCYTESUR NEGATIVE   Lipid Panel    Component Value Date/Time   CHOL 171 04/09/2013 0725   TRIG 241* 04/09/2013 0725   HDL  33* 04/09/2013 0725   CHOLHDL 5.2 04/09/2013 0725   VLDL 48* 04/09/2013 0725   LDLCALC 90 04/09/2013 0725   HgbA1C  Lab Results  Component Value Date   HGBA1C 6.4* 04/09/2013    Urine Drug Screen:     Component Value Date/Time   LABOPIA NONE DETECTED 04/08/2013 1624   COCAINSCRNUR NONE DETECTED 04/08/2013 1624   LABBENZ NONE DETECTED 04/08/2013 1624   AMPHETMU NONE DETECTED 04/08/2013 1624   THCU NONE DETECTED 04/08/2013 1624   LABBARB NONE DETECTED 04/08/2013 1624    Alcohol Level:   Recent Labs Lab 04/08/13 1510  ETH <11   CT of the brain   04/10/2013 The known recent left cerebellar infarct is better define currently than on studies obtained 2 days prior. There is extensive  supratentorial small vessel disease as well as a prior small left parietal lobe infarct. No convincing new infarct is seen in these areas, although a small infarct could easily be obscured on CT by the degree of underlying small vessel disease. There is no acute hemorrhage or mass effect. The middle cerebral arteries do not show increased attenuation on either side. 04/08/2013   : 1. No acute abnormality. 2. Stable chronic small vessel white matter ischemic changes and old lacunar infarcts.   MRI of the brain  04/08/2013  Sub cm acute infarction within the left cerebellum. No other acute infarction. Extensive old ischemic changes throughout the hemispheric white matter with watershed infarction pattern on the right.    MRA of the brain  04/08/2013  Chronic right internal carotid artery occlusion. Anterior circulation supply occurs via a widely patent left internal carotid artery with patent anterior communicating artery.  Widely patent dominant right vertebral artery. Reconstituted flow in the left vertebral artery.  No flow demonstrated in the right posterior cerebral artery on today's study. This is new since the previous exam. However, no infarction is seen in the right PCA territory as might be expected.    2D Echocardiogram  EF  55-60% with no source of embolus.   Carotid Doppler  Left: CEA patent. 1-39% ICA stenosis. Right: Vertebral artery flow is antegrade. Left: Vertebral artery flow is retrograde.   CXR   04/09/2013 No acute cardiopulmonary disease. 04/08/2013  No acute cardiopulmonary disease.     EKG  normal sinus rhythm. For complete results please see formal report.   Therapy Recommendations   Physical Exam   Middle aged Caucasian lady  In no distress  Afebrile. Head is nontraumatic. Neck is supple without bruit. Hearing is normal. Cardiac exam no murmur or gallop. Lungs are clear to auscultation. Distal pulses are well felt. Neurological Exam : Drowsy but opens eyes easily and follows commands. Mild dysarthria no aphasia.  PERRL, EOMI, decreased blink to threat on the left,  no nystagmus. Mild left lower facial asymmetry. Tongue midline. Motor system exam reveals marked LUE and now LLE weakness  Sensation is preserved. Gait was not tested.    ASSESSMENT Kelli [name] is a 63 y.o. female presenting with left sided numbness and weakness, progressing to nausea and vomiting. Imaging confirms a left cerebellar infarct. Infarct felt to be thrombotic secondary to small vessel disease.  Waxing and waning of left hemiparesis continues - no neuro or medical etiology for lethargy CT unchanged yesterday. Lethargy and left hemiparesis worsening may be related to lack of sleep. On aspirin 81 mg orally every day prior to admission. Now  enrolled in Socrates, ticagrelor vs aspirin for secondary stroke prevention. Patient with resultantmild LUE weakness, nausea and vomiting. Work up completed.    Nausea and vomiting, lethargic post phenergan, remains lethargic now. On arrival, [name] emesis was gastric occult positive, however no overt bleeding was noted. Hypertension - recently stopped labetalol and switched to atenolol at pt request Hyperlipidemia, LDL 126, on no statin PTA, now on statin, goal LDL < 100 - Now on  Zocor. Chronic kidney disease, stage 3 - follow up with nephrology planned in March. Holding off ARB or ACE Depression - hx of. Dr. Domenic Polite concerned it may need further management Fibromyalgia  Cigarette smoker  Obesity, Body mass index is 36.23 kg/(m^2).  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 S/p L CEA on 10/03/2012 by Dr. Donnetta Hutching. No hx heart failure obstructive sleep apnea - needs formal dx  Per discussion with research coordinator, will hold off on trial medication at this time and start ASA 325mg  daily. Research coordinator to discuss further with family today  Hospital day # 4  TREATMENT/PLAN  Continue Socrates trial, ticagrelor vs aspirin for secondary stroke prevention at this time. Defer continuation to pt and research coordinator   Ok to be OOB with therapy  Will repeat head CT at this time as L sided weakness appears to be increasing  Will follow for disposition recommendations.    I have personally obtained a history, examined the patient, evaluated imaging results, and formulated the assessment and plan of care. I agree with the above.  Jim Like, DO Neurology-Stroke

## 2013-04-12 NOTE — Progress Notes (Signed)
[facility] PROGRESS NOTE  Kelli [name] [id number] DOB: 04/17/1950 DOA: 04/08/2013 PCP: Manon Hilding, MD  Assessment/Plan: #1Acute left cerebellar stroke  -History of left carotid endarterectomy  -MRI brain shows no flow in the right PCA, but not associated with infarct in this territory  -LDL 90--patient was on simvastatin 10 mg prior to admission   2 d echocardiogram with no source of emboli. Carotid Dopplers with no significant ICA stenosis. -PT evaluation  -Urine drug screen and alcohol level negative  -Repeat CT brain 04/10/13 due to RN report of worsen LUE/LLE weak is negative for any acute infarct. Patient with left sided weakness. Repeat CT head per neurology. Patient will be off trial medication at this time per neurology and patient to be started on ASA 325mg  daily.Neuro following and appreciate input and recommendations Hypertension, poorly controlled  -Continue hydralazine to 75 mg 3 times a day  -continue atenolol to 25 mg twice a day  -Continue clonidine TTS 3  -Continue amlodipine 10 mg daily  Impaired glucose tolerance  -Hemoglobin A1c 6.4  -Lifestyle modification  Prior renal artery duplex negative for renal artery stenosis-  CKD stage III  -Review of the records reveals large variation and baseline serum creatinine  -varies from 1.4-1.9  -Urinalysis is bland  Positive gastro-occult  -Hemoglobin has remained stable  -Continue to monitor  -Patient denies any abdominal pain, tolerating diet  History of tobacco use  -Patient quit one year ago  Anxiety disorder/fibromyalgia  -Continue Klonopin 0.5 mg twice a day    Code Status: full Family Communication: updated patient at bedside. Disposition Plan: CIR vs SNF   Consultants:  Neurology: Dr. Nicole Kindred 04/08/2013  Procedures:  CT head 04/10/2013  MRI MRA of the head 04/08/2013  2-D echo 04/10/2013  Carotid Dopplers 04/10/2013  Chest x-ray 04/08/2013,  04/09/2013  Antibiotics:  none  HPI/Subjective: Patient alert.   Objective: Filed Vitals:   04/12/13 0940  BP:   Pulse: 69  Temp: 98 F (36.7 C)  Resp: 16    Intake/Output Summary (Last 24 hours) at 04/12/13 1202 Last data filed at 04/11/13 2222  Gross per 24 hour  Intake    150 ml  Output      0 ml  Net    150 ml   Filed Weights   04/08/13 1507 04/08/13 2115  Weight: 97.523 kg (215 lb) 98.748 kg (217 lb 11.2 oz)    Exam:   General:  Patient alert answers questions appropriately and following commands.  Cardiovascular: regular rate and rhythm  Respiratory: clear to auscultation bilaterally  Abdomen: soft, nontender, nondistended, positive bowel sounds  Musculoskeletal: no clubbing cyanosis or edema. L sided weakness. Left visual field defect.  Data Reviewed: Basic Metabolic Panel:  Recent Labs Lab 04/08/13 1510 04/08/13 1522 04/09/13 0725 04/10/13 0940  NA 139 141 143 141  K 4.4 4.1 3.7 4.6  CL 101 103 103 104  CO2 24  --  25 23  GLUCOSE 114* 114* 134* 186*  BUN 29* 29* 23 24*  CREATININE 1.69* 1.90* 1.40* 1.45*  CALCIUM 8.8  --  8.8 8.6   Liver Function Tests:  Recent Labs Lab 04/08/13 1510 04/10/13 0940  AST 16 44*  ALT 19 16  ALKPHOS 80 65  BILITOT 0.3 0.4  PROT 7.7 7.1  ALBUMIN 3.6 3.3*   No results found for this basename: LIPASE, AMYLASE,  in the last 168 hours No results found for this basename: AMMONIA,  in the last 168 hours CBC:  Recent  Labs Lab 04/08/13 1510 04/08/13 1522 04/09/13 0725 04/10/13 0940  WBC 9.8  --  9.5 11.0*  NEUTROABS 5.9  --   --   --   HGB 11.5* 11.9* 11.1* 11.4*  HCT 34.9* 35.0* 33.9* 34.1*  MCV 85.5  --  85.4 85.3  PLT 300  --  269 276   Cardiac Enzymes:  Recent Labs Lab 04/08/13 1510  TROPONINI <0.30   BNP (last 3 results) No results found for this basename: PROBNP,  in the last 8760 hours CBG:  Recent Labs Lab 04/11/13 1156 04/11/13 1655 04/11/13 2103 04/12/13 0636  04/12/13 1140  GLUCAP 128* 146* 100* 142* 131*    No results found for this or any previous visit (from the past 240 hour(s)).   Studies: No results found.  Scheduled Meds: . amLODipine  10 mg Oral Daily  . aspirin  325 mg Oral Daily  . atenolol  25 mg Oral BID  . cloNIDine  0.3 mg Transdermal Q Tue  . enoxaparin (LOVENOX) injection  40 mg Subcutaneous QHS  . fesoterodine  8 mg Oral Daily  . hydrALAZINE  75 mg Oral 3 times per day  . insulin aspart  0-5 Units Subcutaneous QHS  . insulin aspart  0-9 Units Subcutaneous TID WC  . Placebo/Aspirin-Acetylsalicylic acid  1 tablet Oral Daily  . Placebo/Ticagrelor  1 tablet Oral BID  . simvastatin  10 mg Oral q1800   Continuous Infusions:    Principal Problem:   Acute ischemic stroke Active Problems:   Hypertension, uncontrolled   Occlusion and stenosis of carotid artery with cerebral infarction   Anxiety state, unspecified   CKD (chronic kidney disease) stage 3, GFR 30-59 ml/min   Depression   Diplopia   Left-sided weakness   Vertigo   Paresthesias in left hand   Pre-diabetes   Cerebellar stroke   Dizziness and giddiness   Stroke    Time spent: Ferris MD [facility] Pager [phone]. If 7PM-7AM, please contact night-coverage at [email or web address], password [facility] 04/12/2013, 12:02 PM  LOS: 4 days

## 2013-04-12 NOTE — Progress Notes (Signed)
Occupational Therapy Treatment Patient Details Name: SOLARIS KOZLOFF MRN: [id number] DOB: [birth year] Today's Date: 04/12/2013 Time: [id number] OT Time Calculation (min): 27 min  OT Assessment / Plan / Recommendation  History of present illness JOZALYN WALKENHORST is an 63 y.o. female a history of hypertension, hyperlipidemia, watershed cerebral infarctions, right ICA occlusion and left CEA, presenting with new onset vertigo with nausea and vomiting as well as numbness involving left side of face and left upper extremity and left upper extremity weakness. MRI showed findings consistent with acute small left cerebellar ischemic infarction   OT comments  Pt more alert, but keeps eyes closed for majority of session.  She is unable to maintain eye opening to participate in formal visual assessment, but does not appear to see items to the left of midline during functional tasks.  Keeps head/neck postured to Rt unless cued otherwise.  No active movement noted Lt UE today.   Maintains EOB balance with min - mod A today.  Requires max- total A for all BADLs except grooming.  Recommend CIR  Follow Up Recommendations  CIR    Barriers to Discharge       Equipment Recommendations  None recommended by OT    Recommendations for Other Services    Frequency Min 3X/week   Progress towards OT Goals Progress towards OT goals: Progressing toward goals  Plan Discharge plan remains appropriate (goals updated due to new confirmed stroke)    Precautions / Restrictions Precautions Precautions: Fall Precaution Comments: Lt field deficit and Lt inattention   Pertinent Vitals/Pain     ADL  Grooming: Brushing hair;Moderate assistance Where Assessed - Grooming: Unsupported sitting ADL Comments: Pt moved to EOB.  Worked on trunk control and sitting balance.  Pt maintains head rotated to Rt.  - will turn to Lt with cues.  She was unable to locate clock on wall to her left with maximal cues, and unable to locate objects to  left of midline.  She was unable to read numbers on dynamometer on her Rt. Pt unable to participate in formal visual testing as she is unable to maintain eye opening long enough to do so.  She scooted up EOB with max A    OT Diagnosis:    OT Problem List:   OT Treatment Interventions:     OT Goals(current goals can now be found in the care plan section) Acute Rehab OT Goals OT Goal Formulation: With patient Time For Goal Achievement: 04/25/13 Potential to Achieve Goals: Good ADL Goals Pt Will Perform Upper Body Bathing: with min assist Pt Will Perform Upper Body Dressing: with mod assist Additional ADL Goal #2: Pt will locate items on Lt. during BADLs with min verbal cues  Visit Information  Last OT Received On: 04/12/13 Assistance Needed: +2 (if OOB) History of Present Illness: MAEVERY WASMUND is an 63 y.o. female a history of hypertension, hyperlipidemia, watershed cerebral infarctions, right ICA occlusion and left CEA, presenting with new onset vertigo with nausea and vomiting as well as numbness involving left side of face and left upper extremity and left upper extremity weakness. MRI showed findings consistent with acute small left cerebellar ischemic infarction    Subjective Data      Prior Functioning       Cognition  Cognition Arousal/Alertness: Lethargic Behavior During Therapy: Flat affect Overall Cognitive Status: Impaired/Different from baseline Area of Impairment: Attention;Following commands;Problem solving Current Attention Level: Sustained Following Commands: Follows one step commands consistently Problem Solving: Slow processing;Decreased initiation;Difficulty sequencing;Requires  verbal cues;Requires tactile cues General Comments: Pt is awake, but keeps eyes closed, will open eyes on command, but won't sustain eye opening.  She is slow to process, a bit impulsive and requires mod verbal cues for problem solving.  Appears to have Lt. inattention    Mobility  Bed  Mobility Overal bed mobility: Needs Assistance Bed Mobility: Supine to Sit;Sit to Supine Supine to sit: Mod assist;HOB elevated Sit to supine: Max assist General bed mobility comments: is able to assist with lifting shoulders/trunk from bed.  Needs assist to move LEs onto bed and to control descent of trunk onto bed    Exercises      Balance Balance Overall balance assessment: Needs assistance Sitting-balance support: Single extremity supported;Feet supported Sitting balance-Leahy Scale: Poor Sitting balance - Comments: required mod A - min A for EOB balance - variable status  End of Session OT - End of Session Activity Tolerance: Patient limited by lethargy Patient left: in bed;with call bell/phone within reach;with bed alarm set;with family/visitor present  [place], Ellard Artis M 04/12/2013, 1:45 PM

## 2013-04-12 NOTE — Progress Notes (Signed)
Physical Therapy Treatment Patient Details Name: Kelli [name] MRN: [id number] DOB: 02-May-1950 Today's Date: 04/12/2013 Time: [id number] PT Time Calculation (min): 26 min  PT Assessment / Plan / Recommendation  History of Present Illness Kelli [name] is an 63 y.o. female a history of hypertension, hyperlipidemia, watershed cerebral infarctions, right ICA occlusion and left CEA, presenting with new onset vertigo with nausea and vomiting as well as numbness involving left side of face and left upper extremity and left upper extremity weakness. MRI showed findings consistent with acute small left cerebellar ischemic infarction   PT Comments   Pt with decreased mobility in L LE today and increased difficulty with L neglect.  RN aware and plan for MRI this pm.  Will continue to follow.    Follow Up Recommendations  CIR     Does the patient have the potential to tolerate intense rehabilitation     Barriers to Discharge        Equipment Recommendations   (TBD)    Recommendations for Other Services Rehab consult  Frequency Min 4X/week   Progress towards PT Goals Progress towards PT goals: Progressing toward goals  Plan Current plan remains appropriate    Precautions / Restrictions Precautions Precautions: Fall Precaution Comments: Lt field deficit and Lt inattention Restrictions Weight Bearing Restrictions: No   Pertinent Vitals/Pain Indicates L UE "tender from having to move it around".  UE placed on pillows.      Mobility  Bed Mobility Overal bed mobility: Needs Assistance Bed Mobility: Supine to Sit;Sit to Supine Supine to sit: Mod assist;HOB elevated Sit to supine: Mod assist;+2 for physical assistance General bed mobility comments: pt A by using her R UE, but needs cueing for step-by-step through mobility and attending to task.   Transfers Overall transfer level: Needs assistance Equipment used: 2 person hand held assist Transfers: Sit to/from Stand Sit to Stand: Max  assist;+2 physical assistance General transfer comment: pt needs cueing and facilitation for anterior wt shift with coming to stand, blocking L knee and trunk/hip extension coming to stand.   Modified Rankin (Stroke Patients Only) Pre-Morbid Rankin Score: Slight disability Modified Rankin: Severe disability    Exercises     PT Diagnosis:    PT Problem List:   PT Treatment Interventions:     PT Goals (current goals can now be found in the care plan section) Acute Rehab PT Goals Patient Stated Goal: to get better Time For Goal Achievement: 04/25/13 Potential to Achieve Goals: Good  Visit Information  Last PT Received On: 04/12/13 Assistance Needed: +2 History of Present Illness: Kelli [name] is an 63 y.o. female a history of hypertension, hyperlipidemia, watershed cerebral infarctions, right ICA occlusion and left CEA, presenting with new onset vertigo with nausea and vomiting as well as numbness involving left side of face and left upper extremity and left upper extremity weakness. MRI showed findings consistent with acute small left cerebellar ischemic infarction    Subjective Data  Patient Stated Goal: to get better   Cognition  Cognition Arousal/Alertness: Lethargic Behavior During Therapy: Flat affect Overall Cognitive Status: Impaired/Different from baseline Area of Impairment: Attention;Following commands;Problem solving Current Attention Level: Sustained Following Commands: Follows one step commands consistently Problem Solving: Slow processing;Decreased initiation;Difficulty sequencing;Requires verbal cues;Requires tactile cues General Comments: pt with continued neglect of L side and keeps eyes closed throughout most of session despite cueing to open eyes and attend to task.      Balance  Balance Overall balance assessment: Needs assistance Sitting-balance  support: Single extremity supported Sitting balance-Leahy Scale: Poor Sitting balance - Comments: required mod  A - min A for EOB balance - variable status  End of Session PT - End of Session Equipment Utilized During Treatment: Gait belt Activity Tolerance: Patient tolerated treatment well Patient left: in bed;with call bell/phone within reach;with bed alarm set Nurse Communication: Mobility status;Need for lift equipment   GP     Kelli [name], [place] 04/12/2013, 3:31 PM

## 2013-04-12 NOTE — Plan of Care (Signed)
PA for neuro services paged because pt states she cant do MRI. She gets really nervous and hyperventilates. Needs meds. PA paged and waiting for PA to call back.

## 2013-04-12 NOTE — Progress Notes (Addendum)
Speech Language Pathology Treatment: Dysphagia  Patient Details Name: Kelli [name] MRN: [id number] DOB: 09-04-1950 Today's Date: 04/12/2013 Time: [id number] SLP Time Calculation (min): 43 min  Assessment / Plan / Recommendation Clinical Impression  Pt with improved mentation today but ? Worsening in Decreased attention to left.  She reports absolute displeasure with current diet and at the same time understands concern for aspiration and pna.    Pt with marginally improved oral manipulation with solids today, self feeding with slow mastication and minimal oral residuals requiring moderate verbal cues to swallow to clear.   Pt tends to orally hold liquids and talks prior to swallow initiation although she does respond to verbal cues to swallow.  Ongoing concerns for compromised airway protection with thin liquids present - suspect premature spillage of liquids into larynx - uncontrolled.  Cough and subtle throat clearing ongoing with thins.      Pt agreeable to compromise of allowing thin water between meals after mouth care for QOL and continuing nectar with meals.  Rec advance solids to mechanical soft/ground meats due to mentation and improvement.  Teach back for comprehension completed.  Close monitoring for aspiration is indicated.    Advised pt and son to recommendations.  Further advised son to address pt on left side due to her inattention.     HPI HPI: Kelli [name] is an 63 y.o. female a history of hypertension, hyperlipidemia, watershed cerebral infarctions, right ICA occlusion and left CEA, presenting with new onset vertigo with nausea and vomiting as well as numbness involving left side of face and left upper extremity and left upper extremity weakness. Onset of symptoms was at 2:30 PM today 04/08/2013. Patient has been taking aspirin 81 mg per day per chart review.  She presented to the emergency room at [facility] initially. She was noted initially deemed a candidate for TPA.  However, she became worse with increasing nausea and vomiting as well as vertigo and worsening of numbness and left-sided weakness. Speech also became somewhat slurred. CTA was recommended by Specialist on-Call neurologist. She was subsequently transferred here for MRI and MRA study to rule out basal artery thrombosis. MRI showed findings consistent with acute small left cerebellar ischemic infarction. There is no evidence of basilar artery occlusion or significant stenosis. No evidence of thrombus was seen. NIH stroke score on arrival to [facility] was 5. Patient had dark colored emesis which was guaiac positive. Patient was not administerd TPA.  Pt had speech evaluation yesterday and was noted to be coughing with intake, which pt states has occured since hospitalization.  Bedside evaluation completed yesterday and diet modified to puree/nectar.  Poor intake ongoing due to dysphagia, lethargy and displeasure with po diet.  Pt with worsening neuro symptoms had repeat CT head today, no results in yet.     Pertinent Vitals Afebrile,decreased  SLP Plan  Continue with current plan of care    Recommendations Diet recommendations: Dysphagia 3 (mechanical soft);Nectar-thick liquid (frazier water protocol) Liquids provided via: Cup Medication Administration: Crushed with puree Supervision: Full supervision/cueing for compensatory strategies Compensations: Slow rate;Small sips/bites;Check for pocketing (cue pt to swallow when places boluses in mouth! oral holding) Postural Changes and/or Swallow Maneuvers: Seated upright 90 degrees;Upright 30-60 min after meal              Oral Care Recommendations: Oral care BID Follow up Recommendations: Inpatient Rehab Plan: Continue with current plan of care    GO     Claudie Fisherman,  [facility] SLP [phone]

## 2013-04-12 NOTE — Consult Note (Signed)
Physical Medicine and Rehabilitation Consult Reason for Consult: CVA Referring Physician: [facility]   HPI: Kelli [name] is a 63 y.o. 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 Presented 04/08/2013 left-sided numbness and weakness. MRI of the brain showed acute infarct in the left cerebellum. MRA of the head with chronic right ICA artery occlusion and widely patent dominant right vertebral artery. Echocardiogram with ejection fraction of 60% no wall motion abnormalities. Carotid Dopplers showed left CEA patent and right vertebral artery flow is antegrade. Patient did not receive TPA. Neurology services consulted maintained on socrates trial,ticagrelor versus aspirin for stroke prevention. Subcutaneous Lovenox for DVT prophylaxis. Dysphagia 1 nectar thick liquid diet. Physical and occupational therapy evaluations completed with recommendations for physical medicine rehabilitation consult  Review of Systems  Genitourinary:       Urinary incontinence  Musculoskeletal: Positive for joint pain and myalgias.  Psychiatric/Behavioral:       Anxiety  All other systems reviewed and are negative.   Past Medical History  Diagnosis Date  . Essential hypertension, benign   . Fibromyalgia   . Cerebral infarction Aug 2014    Bihemispheric watershed infarcts  . Mixed hyperlipidemia   . Borderline diabetes   . Urinary incontinence   . Carotid artery occlusion     Occluded RICA, status post left CEA  August 2014 - Dr. Donnetta Hutching  . CKD (chronic kidney disease) stage 3, GFR 30-59 ml/min   . 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 Urethral dilation  1980's  . Combined hysterectomy vaginal w/ mmk  / a&p repair  1981  . Vaginal hysterectomy  1981    "partial" (10/04/2012)  . Endarterectomy Left 10/06/2012    Procedure: Carotid Endarterectomy with Finesse patch angioplasty;  Surgeon: [name], MD;  Location: [facility] OR;  Service: Vascular;  Laterality: Left;   Family History  Problem Relation Age of Onset  . Diabetes Mother   . Diabetes Father   . Hypertension Sister   . Diabetes Brother   . Seizures Son   . Kidney disease Maternal Grandmother   . Hypertension Maternal Grandmother   . Heart disease Maternal Grandfather   . Diabetes Paternal Grandmother   . Heart disease Paternal Grandfather    Social History:  reports that she quit smoking about 6 months ago. Her smoking use included Cigarettes. She has a 44 pack-year smoking history. She has never used smokeless tobacco. She reports that she does not drink alcohol or use illicit drugs. Allergies:  Allergies  Allergen Reactions  . Hydrochlorothiazide Nausea And Vomiting   Medications Prior to Admission  Medication Sig Dispense Refill  . amLODipine (NORVASC) 10 MG tablet Take 10 mg by mouth daily.      Marland Kitchen aspirin EC 81 MG tablet Take 81 mg by mouth every morning.      Marland Kitchen atenolol (TENORMIN) 25 MG tablet Take 1 tablet (25 mg total) by mouth daily.  180 tablet  3  . clonazePAM (KLONOPIN) 0.5 MG tablet Take 0.5 mg by mouth 2 (two) times daily as needed for anxiety.      . cloNIDine (CATAPRES - DOSED IN MG/24 HR) 0.3 mg/24hr patch Place 0.3 mg onto the skin once a week. On Tuesday      .  hydrALAZINE (APRESOLINE) 50 MG tablet Take 1 tablet (50 mg total) by mouth 2 (two) times daily before a meal.  270 tablet  3  . simvastatin (ZOCOR) 10 MG tablet Take 1 tablet (10 mg total) by mouth daily at 6 PM.  30 tablet  1  . tolterodine (DETROL LA) 4 MG 24 hr capsule Take 1 capsule (4 mg total) by mouth daily.  30 capsule  3    Home: Home Living Family/patient expects to be discharged to:: Private residence Living Arrangements:  Spouse/significant other;Other relatives Available Help at Discharge: Family Type of Home: House Home Access: Stairs to enter [employer] of Steps: 3 Entrance Stairs-Rails: None Home Layout: Two level;Able to live on main level with bedroom/bathroom Alternate Level Stairs-Number of Steps: flight Alternate Level Stairs-Rails: Right Home Equipment: Shower seat  Lives With: Spouse  Functional History: Prior Function Comments: Engineer, manufacturing systems, gardening, art, nature.  Pt reports she does not drive Functional Status:  Mobility:          ADL: ADL Eating/Feeding: Other (comment) (not attempted due to decreased arrousal) Grooming: Brushing hair;Supervision/safety Where Assessed - Grooming: Supine, head of bed up Upper Body Bathing: Maximal assistance Where Assessed - Upper Body Bathing: Supine, head of bed up Lower Body Bathing: +1 Total assistance Where Assessed - Lower Body Bathing: Supine, head of bed up;Rolling right and/or left Upper Body Dressing: +1 Total assistance Where Assessed - Upper Body Dressing: Supine, head of bed up Lower Body Dressing: +1 Total assistance Where Assessed - Lower Body Dressing: Supine, head of bed up;Rolling right and/or left Toilet Transfer: +1 Total assistance (did not attempt due to lethargy) ADL Comments: Pt too fatigued to fully participate in simple ADL tasks  Cognition: Cognition Overall Cognitive Status: Impaired/Different from baseline Arousal/Alertness: Lethargic Orientation Level: Oriented to person;Oriented to place;Disoriented to situation;Disoriented to time Attention: Sustained Sustained Attention: Impaired Sustained Attention Impairment: Verbal basic;Functional basic Memory: Impaired Memory Impairment: Decreased recall of new information Awareness: Impaired Awareness Impairment: Intellectual impairment Problem Solving: Impaired Problem Solving Impairment: Verbal basic Safety/Judgment:  Impaired Cognition Arousal/Alertness: Lethargic Behavior During Therapy: Flat affect Overall Cognitive Status: Impaired/Different from baseline Area of Impairment: Attention;Following commands;Problem solving Current Attention Level: Sustained Following Commands: Follows one step commands consistently Problem Solving: Slow processing;Decreased initiation;Requires verbal cues;Requires tactile cues;Difficulty sequencing General Comments: Pt very lethargic throughout.  She will answer questions and converse, but keeps eyes closed.  At times she will drift off to sleep, but arrouses again fairly easily to answer questions.  She will consistently follow one step commands, but unable to sustain attention to complete multi step commands   Blood pressure 164/58, pulse 67, temperature 98.6 F (37 C), temperature source Oral, resp. rate 18, height 5\' 5"  (1.651 m), weight 98.748 kg (217 lb 11.2 oz), SpO2 100.00%. Physical Exam  Vitals reviewed. HENT:  Head: Normocephalic.  Eyes: EOM are normal.  Neck: Normal range of motion. Neck supple. No thyromegaly present.  Cardiovascular: Normal rate and regular rhythm.   Respiratory: Effort normal and breath sounds normal. No respiratory distress.  GI: Soft. Bowel sounds are normal. She exhibits no distension.  Neurological:  Patient was awake.  She kept her eyes closed during exam. She was able to name place as well as her age. Follows simple commands. Left HH and some inattention also. 0/5 LUE and LLE on exam. Does sense pain. Left facial droop and tongue deviation.  Skin: Skin is warm and dry.  Psychiatric:  A little lethargic, somewhat anxious per baseline otherwise.  Very cooperative.    Results for orders placed during the hospital encounter of 04/08/13 (from the past 24 hour(s))  GLUCOSE, CAPILLARY     Status: Abnormal   Collection Time    04/11/13  7:11 AM      Result Value Ref Range   Glucose-Capillary 127 (*) 70 - 99 mg/dL  GLUCOSE, CAPILLARY      Status: Abnormal   Collection Time    04/11/13 11:56 AM      Result Value Ref Range   Glucose-Capillary 128 (*) 70 - 99 mg/dL   Comment 1 Documented in Chart     Comment 2 Notify RN    GLUCOSE, CAPILLARY     Status: Abnormal   Collection Time    04/11/13  4:55 PM      Result Value Ref Range   Glucose-Capillary 146 (*) 70 - 99 mg/dL  GLUCOSE, CAPILLARY     Status: Abnormal   Collection Time    04/11/13  9:03 PM      Result Value Ref Range   Glucose-Capillary 100 (*) 70 - 99 mg/dL   Comment 1 Notify RN     Comment 2 Documented in Chart     Ct Head Wo Contrast  04/10/2013   CLINICAL DATA:  Left upper and left lower extremity weakness  EXAM: CT HEAD WITHOUT CONTRAST  TECHNIQUE: Contiguous axial images were obtained from the base of the skull through the vertex without intravenous contrast. Study was obtained within 24 hr of patient's arrival at the emergency department.  COMPARISON:  Brain CT and brain MRI April 08, 2013  FINDINGS: There is mild diffuse atrophy, stable. There is no demonstrable mass, hemorrhage, extra-axial fluid collection, or midline shift. The known acute cerebellar infarct on the left is better defined on this study compared to recent prior studies. There is extensive small vessel disease throughout the centra semiovale bilaterally. There is evidence of a prior small infarct in the medial left parietal lobe. There is no new infarct appreciable compared to 2 days prior.  Bony calvarium appears intact.  The mastoid air cells are clear.  IMPRESSION: The known recent left cerebellar infarct is better define currently than on studies obtained 2 days prior. There is extensive supratentorial small vessel disease as well as a prior small left parietal lobe infarct. No convincing new infarct is seen in these areas, although a small infarct could easily be obscured on CT by the degree of underlying small vessel disease. There is no acute hemorrhage or mass effect. The middle cerebral  arteries do not show increased attenuation on either side.   Electronically Signed   By: Lowella Grip M.D.   On: 04/10/2013 08:42    Assessment/Plan: Diagnosis: left cerebellar infarct, ?acute right cortical infarct 1. Does the need for close, 24 hr/day medical supervision in concert with the patient's rehab needs make it unreasonable for this patient to be served in a less intensive setting? Yes 2. Co-Morbidities requiring supervision/potential complications: ckd, depression with anxiety 3. Due to bladder management, bowel management, safety, skin/wound care, disease management, medication administration, pain management and patient education, does the patient require 24 hr/day rehab nursing? Yes 4. Does the patient require coordinated care of a physician, rehab nurse, PT (1-2 hrs/day, 5 days/week), OT (1-2 hrs/day, 5 days/week) and SLP (1-2 hrs/day, 5 days/week) to address physical and functional deficits in the context of the above medical diagnosis(es)? Yes Addressing deficits in the following areas: balance, endurance, locomotion, strength, transferring, bowel/bladder control, bathing,  dressing, feeding, grooming, toileting, cognition, speech, swallowing and psychosocial support 5. Can the patient actively participate in an intensive therapy program of at least 3 hrs of therapy per day at least 5 days per week? Yes and Potentially 6. The potential for patient to make measurable gains while on inpatient rehab is good 7. Anticipated functional outcomes upon discharge from inpatient rehab are supervision to min assist with PT, supervision to min assist with OT, supervision with SLP. 8. Estimated rehab length of stay to reach the above functional goals is: 20-25 days 9. Does the patient have adequate social supports to accommodate these discharge functional goals? Potentially 10. Anticipated D/C setting: Home 11. Anticipated post D/C treatments: [facility] 12. Overall Rehab/Functional  Prognosis: good  RECOMMENDATIONS: This patient's condition is appropriate for continued rehabilitative care in the following setting: CIR Patient has agreed to participate in recommended program. Yes Note that insurance prior authorization may be required for reimbursement for recommended care.  Comment: Need to follow up on social supports. Will need some assist upon dc to home.  Meredith Staggers, MD, [facility]     04/12/2013

## 2013-04-13 ENCOUNTER — Inpatient Hospital Stay (HOSPITAL_COMMUNITY): Payer: 59

## 2013-04-13 DIAGNOSIS — I639 Cerebral infarction, unspecified: Secondary | ICD-10-CM | POA: Diagnosis not present

## 2013-04-13 LAB — BASIC METABOLIC PANEL
BUN: 24 mg/dL — AB (ref 6–23)
CO2: 24 meq/L (ref 19–32)
CREATININE: 1.49 mg/dL — AB (ref 0.50–1.10)
Calcium: 9 mg/dL (ref 8.4–10.5)
Chloride: 106 mEq/L (ref 96–112)
GFR calc Af Amer: 42 mL/min — ABNORMAL LOW (ref >90)
GFR calc non Af Amer: 36 mL/min — ABNORMAL LOW (ref >90)
GLUCOSE: 144 mg/dL — AB (ref 70–99)
Potassium: 3.3 mEq/L — ABNORMAL LOW (ref 3.7–5.3)
Sodium: 142 mEq/L (ref 137–147)

## 2013-04-13 LAB — CBC
HEMATOCRIT: 36.9 % (ref 36.0–46.0)
Hemoglobin: 12.1 g/dL (ref 12.0–15.0)
MCH: 28.1 pg (ref 26.0–34.0)
MCHC: 32.8 g/dL (ref 30.0–36.0)
MCV: 85.6 fL (ref 78.0–100.0)
Platelets: 276 10*3/uL (ref 150–400)
RBC: 4.31 MIL/uL (ref 3.87–5.11)
RDW: 13.7 % (ref 11.5–15.5)
WBC: 11.2 10*3/uL — ABNORMAL HIGH (ref 4.0–10.5)

## 2013-04-13 LAB — GLUCOSE, CAPILLARY
GLUCOSE-CAPILLARY: 142 mg/dL — AB (ref 70–99)
GLUCOSE-CAPILLARY: 101 mg/dL — AB (ref 70–99)
Glucose-Capillary: 143 mg/dL — ABNORMAL HIGH (ref 70–99)
Glucose-Capillary: 124 mg/dL — ABNORMAL HIGH (ref 70–99)

## 2013-04-13 MED ORDER — BIOTENE DRY MOUTH MT LIQD
15.0000 mL | Freq: Two times a day (BID) | OROMUCOSAL | Status: DC
  Administered 2013-04-13 – 2013-04-17 (×7): 15 mL via OROMUCOSAL

## 2013-04-13 MED ORDER — SODIUM CHLORIDE 0.9 % IV SOLN
INTRAVENOUS | Status: DC
  Administered 2013-04-13 – 2013-04-16 (×2): via INTRAVENOUS

## 2013-04-13 MED ORDER — POTASSIUM CHLORIDE CRYS ER 20 MEQ PO TBCR
40.0000 meq | EXTENDED_RELEASE_TABLET | Freq: Once | ORAL | Status: AC
  Administered 2013-04-13: 40 meq via ORAL
  Filled 2013-04-13 (×2): qty 2

## 2013-04-13 MED ORDER — LORAZEPAM 2 MG/ML IJ SOLN
0.5000 mg | Freq: Once | INTRAMUSCULAR | Status: AC
  Administered 2013-04-13: 0.5 mg via INTRAVENOUS
  Filled 2013-04-13: qty 1

## 2013-04-13 NOTE — Progress Notes (Signed)
Stroke Team Progress Note  HISTORY Kelli [name] is a 63 y.o. female with a history of hypertension, hyperlipidemia, watershed cerebral infarctions, right ICA occlusion and left CEA, presenting 04/08/2013 with new onset vertigo with nausea and vomiting as well as numbness involving left side of face and left upper extremity and left upper extremity weakness. Onset of symptoms was at 2:30 PM  04/08/2013. Patient had been taking aspirin 81 mg per day. She presented to the emergency room at [facility]. She was initially deemed a candidate for TPA. However, she became worse with increasing nausea and vomiting as well as vertigo and worsening of numbness and left-sided weakness. Speech also became somewhat slurred. CTA was recommended by Specialist on-Call neurologist. Study could not be done however because of patient's elevated creatinine. She was subsequently transferred to [facility] for MRI and MRA study to rule out basal artery thrombosis. MRI showed findings consistent with acute small left cerebellar ischemic infarction. There was no evidence of basilar artery occlusion or significant stenosis. No evidence of thrombus was seen. NIH stroke score on arrival to [facility] was 5. Patient had dark colored emesis which was guaiac positive. Patient was not administerd TPA.  SUBJECTIVE Pt complains of a severe headache. 9/10. Radiates into right sinus area adjacent to nose. Still lethargic but oriented and follows commands.  OBJECTIVE Most recent Vital Signs: Filed Vitals:   04/12/13 2231 04/13/13 0148 04/13/13 0230 04/13/13 0435  BP: 100/60 193/77 217/81 175/65  Pulse:  95  66  Temp:  98.9 F (37.2 C)  98.5 F (36.9 C)  TempSrc:  Oral  Oral  Resp:  20  20  Height:      Weight:      SpO2:  96%  96%   CBG (last 3)   Recent Labs  04/12/13 1716 04/12/13 2108 04/13/13 0622  GLUCAP 147* 121* 142*    IV Fluid Intake:      MEDICATIONS  . amLODipine  10 mg Oral Daily  . aspirin  325 mg  Oral Daily  . atenolol  25 mg Oral BID  . cloNIDine  0.3 mg Transdermal Q Tue  . enoxaparin (LOVENOX) injection  40 mg Subcutaneous QHS  . fesoterodine  8 mg Oral Daily  . hydrALAZINE  75 mg Oral 3 times per day  . insulin aspart  0-5 Units Subcutaneous QHS  . insulin aspart  0-9 Units Subcutaneous TID WC  . Placebo/Aspirin-Acetylsalicylic acid  1 tablet Oral Daily  . Placebo/Ticagrelor  1 tablet Oral BID  . potassium chloride  40 mEq Oral Once  . simvastatin  10 mg Oral q1800   PRN:  acetaminophen, acetaminophen, clonazePAM, hydrALAZINE, ondansetron (ZOFRAN) IV, prochlorperazine, RESOURCE THICKENUP CLEAR, senna-docusate, sodium chloride  Diet:  Dysphagia thin liquids Activity:  As tolerated DVT Prophylaxis:  Lovenox 40 mg sq daily   CLINICALLY SIGNIFICANT STUDIES Basic Metabolic Panel:   Recent Labs Lab 04/10/13 0940 04/13/13 0545  NA 141 142  K 4.6 3.3*  CL 104 106  CO2 23 24  GLUCOSE 186* 144*  BUN 24* 24*  CREATININE 1.45* 1.49*  CALCIUM 8.6 9.0   Liver Function Tests:   Recent Labs Lab 04/08/13 1510 04/10/13 0940  AST 16 44*  ALT 19 16  ALKPHOS 80 65  BILITOT 0.3 0.4  PROT 7.7 7.1  ALBUMIN 3.6 3.3*   CBC:   Recent Labs Lab 04/08/13 1510  04/10/13 0940 04/13/13 0545  WBC 9.8  < > 11.0* 11.2*  NEUTROABS 5.9  --   --   --  HGB 11.5*  < > 11.4* 12.1  HCT 34.9*  < > 34.1* 36.9  MCV 85.5  < > 85.3 85.6  PLT 300  < > 276 276  < > = values in this interval not displayed. Coagulation:   Recent Labs Lab 04/08/13 1510  LABPROT 12.7  INR 0.97   Cardiac Enzymes:   Recent Labs Lab 04/08/13 1510  TROPONINI <0.30   Urinalysis:   Recent Labs Lab 04/08/13 1623  COLORURINE YELLOW  LABSPEC 1.020  PHURINE 7.0  GLUCOSEU NEGATIVE  HGBUR NEGATIVE  BILIRUBINUR NEGATIVE  KETONESUR NEGATIVE  PROTEINUR 100*  UROBILINOGEN 0.2  NITRITE NEGATIVE  LEUKOCYTESUR NEGATIVE   Lipid Panel    Component Value Date/Time   CHOL 171 04/09/2013 0725   TRIG  241* 04/09/2013 0725   HDL 33* 04/09/2013 0725   CHOLHDL 5.2 04/09/2013 0725   VLDL 48* 04/09/2013 0725   LDLCALC 90 04/09/2013 0725   HgbA1C  Lab Results  Component Value Date   HGBA1C 6.4* 04/09/2013    Urine Drug Screen:     Component Value Date/Time   LABOPIA NONE DETECTED 04/08/2013 1624   COCAINSCRNUR NONE DETECTED 04/08/2013 1624   LABBENZ NONE DETECTED 04/08/2013 1624   AMPHETMU NONE DETECTED 04/08/2013 1624   THCU NONE DETECTED 04/08/2013 1624   LABBARB NONE DETECTED 04/08/2013 1624    Alcohol Level:   Recent Labs Lab 04/08/13 1510  ETH <11   CT of the brain   04/10/2013  The known recent left cerebellar infarct is better define currently than on studies obtained 2 days prior. There is extensive  supratentorial small vessel disease as well as a prior small left parietal lobe infarct. No convincing new infarct is seen in these areas, although a small infarct could easily be obscured on CT by the degree of underlying small vessel disease. There is no acute hemorrhage or mass effect. The middle cerebral arteries do not show increased attenuation on either side.  04/08/2013   : 1. No acute abnormality. 2. Stable chronic small vessel white matter ischemic changes and old lacunar infarcts.   MRI of the brain  04/08/2013  Sub cm acute infarction within the left cerebellum. No other acute infarction. Extensive old ischemic changes throughout the hemispheric white matter with watershed infarction pattern on the right.    MRA of the brain  04/08/2013  Chronic right internal carotid artery occlusion. Anterior circulation supply occurs via a widely patent left internal carotid artery with patent anterior communicating artery.  Widely patent dominant right vertebral artery. Reconstituted flow in the left vertebral artery.  No flow demonstrated in the right posterior cerebral artery on today's study. This is new since the previous exam. However, no infarction is seen in the right PCA territory as might be expected.     Limited MRI of the brain without contrast 04/12/2013 ( not yet performed ) Pending  2D Echocardiogram  EF 55-60% with no source of embolus.   Carotid Doppler  Left: CEA patent. 1-39% ICA stenosis. Right: Vertebral artery flow is antegrade. Left: Vertebral artery flow is retrograde.   CXR   04/09/2013 No acute cardiopulmonary disease. 04/08/2013  No acute cardiopulmonary disease.     EKG  normal sinus rhythm. For complete results please see formal report.   Therapy Recommendations - inpatient rehabilitation recommended. The patient is being considered.  Physical Exam   Middle aged Caucasian lady  In no distress  Afebrile. Head is nontraumatic. Neck is supple without bruit. Hearing is normal. Cardiac  exam no murmur or gallop. Lungs are clear to auscultation. Distal pulses are well felt. Neurological Exam : Drowsy but opens eyes easily and follows commands. Mild dysarthria no aphasia. PERRL, EOMI, decreased blink to threat on the left,  no nystagmus. Mild left lower facial asymmetry. Tongue midline. Motor system exam reveals marked LUE and now LLE weakness  0/5 Sensation is preserved. Gait was not tested.    ASSESSMENT Kelli [name] is a 63 y.o. female presenting with left sided numbness and weakness, progressing to nausea and vomiting. Imaging confirms a left cerebellar infarct. Infarct felt to be thrombotic secondary to small vessel disease.  Waxing and waning of left hemiparesis continues - no neuro or medical etiology for lethargy CT unchanged yesterday. Lethargy and left hemiparesis worsening may be related to lack of sleep. On aspirin 81 mg orally every day prior to admission. Now  enrolled in Socrates, ticagrelor vs aspirin for secondary stroke prevention. Patient with resultantmild LUE weakness, nausea and vomiting. Work up completed.    Nausea and vomiting, lethargic post phenergan, remains lethargic now. On arrival, [name] emesis was gastric occult positive, however no overt  bleeding was noted. Hypertension - recently stopped labetalol and switched to atenolol at pt request. Blood pressure running high at times. Continue to monitor. Hyperlipidemia, LDL 126, on no statin PTA, now on statin, goal LDL < 100 - Now on Zocor. Chronic kidney disease, stage 3 - follow up with nephrology planned in March. Holding off ARB or ACE Depression - hx of. Dr. Domenic Polite concerned it may need further management Fibromyalgia  Cigarette smoker  Obesity, Body mass index is 36.23 kg/(m^2).  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 S/p L CEA on 10/03/2012 by Dr. Donnetta Hutching. No hx heart failure - ejection fraction 55-60% by recent echo Obstructive sleep apnea - needs formal dx Headache - await MRI before treating with anything stronger than tylenol.  Per discussion with research coordinator, will hold off on trial medication at this time and start ASA 325mg  daily. Research coordinator to discuss further with family today  Hospital day # 5  TREATMENT/PLAN  Continue Socrates trial, ticagrelor vs aspirin for secondary stroke prevention at this time. Defer continuation to pt and Research officer, political party. There was a report of a rash thought secondary to trial drug. Discussed with Research officer, political party. The plan at this time is to continue aspirin 325 mg daily.  Ok to be OOB with therapy  Limited MRI of the brain ordered yesterday - awaiting results. Spoke with MRI tech - they will call for patient shortley. Will change order to stat.  Will follow for disposition recommendations. - inpatient rehabilitation is being considered.    I have personally obtained a history, examined the patient, evaluated imaging results, and formulated the assessment and plan of care. I agree with the above.  Jim Like, DO Neurology-Stroke

## 2013-04-13 NOTE — Progress Notes (Signed)
Physical Therapy Treatment Patient Details Name: Kelli [name] MRN: [id number] DOB: [birth year] Today's Date: 04/13/2013 Time: [id number] PT Time Calculation (min): 43 min  PT Assessment / Plan / Recommendation  History of Present Illness Kelli [name] is an 63 y.o. female a history of hypertension, hyperlipidemia, watershed cerebral infarctions, right ICA occlusion and left CEA, presenting with new onset vertigo with nausea and vomiting as well as numbness involving left side of face and left upper extremity and left upper extremity weakness. MRI showed findings consistent with acute small left cerebellar ischemic infarction   PT Comments   Patient making good progress today with therapy and able to stand several times and transfer to recliner. Patient also with decreased inattention to L side. ABle to wipe full face and wipe off L arm with R hand. Progressing very well. Continue to recommend comprehensive inpatient rehab (CIR) for post-acute therapy needs.   Follow Up Recommendations  CIR     Does the patient have the potential to tolerate intense rehabilitation     Barriers to Discharge        Equipment Recommendations       Recommendations for Other Services    Frequency Min 4X/week   Progress towards PT Goals Progress towards PT goals: Progressing toward goals  Plan Current plan remains appropriate    Precautions / Restrictions Precautions Precautions: Fall Precaution Comments: Lt field deficit and Lt inattention   Pertinent Vitals/Pain no apparent distress    Mobility  Bed Mobility Bed Mobility: Supine to Sit;Sit to Supine Supine to sit: Mod assist;HOB elevated General bed mobility comments: pt A by using her R UE, but needs cueing for step-by-step through mobility and attending to task.   Transfers Overall transfer level: Needs assistance Equipment used: 2 person hand held assist Transfers: Sit to/from Stand Sit to Stand: Mod assist;+2 physical assistance Squat pivot  transfers: Max assist;+2 physical assistance General transfer comment: pt needs cueing and facilitation for anterior wt shift with coming to stand, blocking L knee and trunk/hip extension coming to stand.  Able to stand x3 this session with better initiation each stand    Exercises     PT Diagnosis:    PT Problem List:   PT Treatment Interventions:     PT Goals (current goals can now be found in the care plan section)    Visit Information  Last PT Received On: 04/13/13 History of Present Illness: Kelli [name] is an 63 y.o. female a history of hypertension, hyperlipidemia, watershed cerebral infarctions, right ICA occlusion and left CEA, presenting with new onset vertigo with nausea and vomiting as well as numbness involving left side of face and left upper extremity and left upper extremity weakness. MRI showed findings consistent with acute small left cerebellar ischemic infarction    Subjective Data      Cognition  Cognition Arousal/Alertness: Lethargic Behavior During Therapy: Flat affect Overall Cognitive Status: Impaired/Different from baseline Area of Impairment: Attention;Following commands;Problem solving Current Attention Level: Sustained Following Commands: Follows one step commands consistently Problem Solving: Slow processing;Decreased initiation;Difficulty sequencing;Requires verbal cues;Requires tactile cues General Comments: Better attention and stimulation to L side today. patient able to rub left arm with washcloth and turn head ~ 60 to left    Balance  Balance Overall balance assessment: Needs assistance Sitting balance-Leahy Scale: Poor Sitting balance - Comments: required minA - [place] for EOB balance - variable status Standing balance-Leahy Scale: Zero  End of Session PT - End of Session Equipment Utilized During Treatment:  Gait belt Activity Tolerance: Patient tolerated treatment well Patient left: in chair;with call bell/phone within reach Nurse  Communication: Mobility status;Need for lift equipment   GP     Jacqualyn Posey 04/13/2013, 12:39 PM 04/13/2013 Jacqualyn Posey PTA [phone] pager [phone] office

## 2013-04-13 NOTE — Progress Notes (Signed)
Notified of new R PCA infarct found on MRI. Will set patient up for [name] and loop recorder to look for possible embolic source.

## 2013-04-13 NOTE — Progress Notes (Signed)
Rehab admissions - Per chart review, noted that pt with new R PCA infarct found on MRI. Also, per previous discussion with pt and her son, they are unsure if 24 hr assist can be provided after potential inpatient rehab stay. May need skilled nursing facility. Will follow up with pt on Monday. Please call with any questions. Thanks.  Nanetta Batty, PT Rehabilitation Admissions Coordinator [phone]

## 2013-04-13 NOTE — Clinical Social Work Placement (Addendum)
Clinical Social Work Department CLINICAL SOCIAL WORK PLACEMENT NOTE 04/13/2013  Patient:  Kelli [name], Kelli [name]  Account Number:  [id number] Admit date:  04/08/2013  Clinical Social Worker:  Raquel Sarna [place], LCSWA  Date/time:  04/13/2013 11:29 AM  Clinical Social Work is seeking post-discharge placement for this patient at the following level of care:   SKILLED NURSING   (*CSW will update this form in Epic as items are completed)   04/13/2013  Patient/family provided with [place] Department of Clinical Social Work's list of facilities offering this level of care within the geographic area requested by the patient (or if unable, by the patient's family).  04/13/2013  Patient/family informed of their freedom to choose among providers that offer the needed level of care, that participate in Medicare, Medicaid or managed care program needed by the patient, have an available bed and are willing to accept the patient.  04/13/2013  Patient/family informed of [facility]' ownership interest in [facility], as well as of the fact that they are under no obligation to receive care at this facility.  PASARR submitted to EDS on 04/13/2013 PASARR number received from EDS on 04/13/2013  FL2 transmitted to all facilities in geographic area requested by pt/family on  04/13/2013 FL2 transmitted to all facilities within larger geographic area on   Patient informed that his/her managed care company has contracts with or will negotiate with  certain facilities, including the following:     Patient/family informed of bed offers received:  04/16/2013 Patient chooses bed at  Physician recommends and patient chooses bed at    Patient to be transferred to  on   Patient to be transferred to facility by   The following physician request were entered in Epic:   Additional Comments:  Pati Gallo, [place] Worker [phone]

## 2013-04-13 NOTE — Progress Notes (Signed)
SLP Cancellation Note  Patient Details Name: Kelli [name] MRN: [id number] DOB: 1950/07/18   Cancelled treatment:        Pt. Sedated following MRI.  Will follow up as schedule allows.   Orbie Pyo [place].Ed [employer] [phone]  04/13/2013

## 2013-04-13 NOTE — Progress Notes (Signed)
[facility] PROGRESS NOTE  Kelli [name] [id number] DOB: Dec 24, 1950 DOA: 04/08/2013 PCP: Manon Hilding, MD  Assessment/Plan: #1Acute left cerebellar stroke  -History of left carotid endarterectomy  -MRI brain shows no flow in the right PCA, but not associated with infarct in this territory  -LDL 90--patient was on simvastatin 10 mg prior to admission   2 d echocardiogram with no source of emboli. Carotid Dopplers with no significant ICA stenosis. -PT evaluation  -Urine drug screen and alcohol level negative  -Repeat CT brain 04/10/13 due to RN report of worsen LUE/LLE weak is negative for any acute infarct. Patient with left sided weakness. Repeat CT head per neurology is negative. MRI is pending to rule out acute stroke.  Patient will be off trial medication at this time per neurology and patient to be started on ASA 325mg  daily. Neuro following and appreciate input and recommendations Hypertension, poorly controlled  -Continue hydralazine to 75 mg 3 times a day  -continue atenolol to 25 mg twice a day  -Continue clonidine TTS 3  -Continue amlodipine 10 mg daily  Impaired glucose tolerance  -Hemoglobin A1c 6.4  -Lifestyle modification  Prior renal artery duplex negative for renal artery stenosis-  CKD stage III  -Review of the records reveals large variation and baseline serum creatinine  -varies from 1.4-1.9  -Urinalysis is bland  Positive gastro-occult  -Hemoglobin has remained stable  -Continue to monitor  -Patient denies any abdominal pain, tolerating diet  History of tobacco use  -Patient quit one year ago  Anxiety disorder/fibromyalgia  -Continue Klonopin 0.5 mg twice a day    Code Status: full Family Communication: updated patient at bedside. Disposition Plan: CIR vs SNF   Consultants:  Neurology: Dr. Nicole Kindred 04/08/2013  Procedures:  CT head 04/10/2013  MRI MRA of the head 04/08/2013  2-D echo 04/10/2013  Carotid Dopplers 04/10/2013  Chest  x-ray 04/08/2013, 04/09/2013  MRI head 04/13/2013 pending  Antibiotics:  none  HPI/Subjective: Patient asleep post MRI after premedication with ativan  Objective: Filed Vitals:   04/13/13 1344  BP: 170/55  Pulse: 65  Temp: 97.8 F (36.6 C)  Resp: 20    Intake/Output Summary (Last 24 hours) at 04/13/13 1406 Last data filed at 04/13/13 0718  Gross per 24 hour  Intake    150 ml  Output      0 ml  Net    150 ml   Filed Weights   04/08/13 1507 04/08/13 2115  Weight: 97.523 kg (215 lb) 98.748 kg (217 lb 11.2 oz)    Exam:   General:  Patient asleep post MRI.  Cardiovascular: regular rate and rhythm  Respiratory: clear to auscultation bilaterally  Abdomen: soft, nontender, nondistended, positive bowel sounds  Musculoskeletal: no clubbing cyanosis or edema. L sided weakness. Left visual field defect.  Data Reviewed: Basic Metabolic Panel:  Recent Labs Lab 04/08/13 1510 04/08/13 1522 04/09/13 0725 04/10/13 0940 04/13/13 0545  NA 139 141 143 141 142  K 4.4 4.1 3.7 4.6 3.3*  CL 101 103 103 104 106  CO2 24  --  25 23 24   GLUCOSE 114* 114* 134* 186* 144*  BUN 29* 29* 23 24* 24*  CREATININE 1.69* 1.90* 1.40* 1.45* 1.49*  CALCIUM 8.8  --  8.8 8.6 9.0   Liver Function Tests:  Recent Labs Lab 04/08/13 1510 04/10/13 0940  AST 16 44*  ALT 19 16  ALKPHOS 80 65  BILITOT 0.3 0.4  PROT 7.7 7.1  ALBUMIN 3.6 3.3*   No results  found for this basename: LIPASE, AMYLASE,  in the last 168 hours No results found for this basename: AMMONIA,  in the last 168 hours CBC:  Recent Labs Lab 04/08/13 1510 04/08/13 1522 04/09/13 0725 04/10/13 0940 04/13/13 0545  WBC 9.8  --  9.5 11.0* 11.2*  NEUTROABS 5.9  --   --   --   --   HGB 11.5* 11.9* 11.1* 11.4* 12.1  HCT 34.9* 35.0* 33.9* 34.1* 36.9  MCV 85.5  --  85.4 85.3 85.6  PLT 300  --  269 276 276   Cardiac Enzymes:  Recent Labs Lab 04/08/13 1510  TROPONINI <0.30   BNP (last 3 results) No results found  for this basename: PROBNP,  in the last 8760 hours CBG:  Recent Labs Lab 04/12/13 1140 04/12/13 1716 04/12/13 2108 04/13/13 0622 04/13/13 1126  GLUCAP 131* 147* 121* 142* 143*    No results found for this or any previous visit (from the past 240 hour(s)).   Studies: Ct Head Wo Contrast  04/12/2013   CLINICAL DATA:  Lethargy and increased left-sided weakness.  EXAM: CT HEAD WITHOUT CONTRAST  TECHNIQUE: Contiguous axial images were obtained from the base of the skull through the vertex without intravenous contrast.  COMPARISON:  04/10/2013 head CT and 04/08/2013 brain MRI  FINDINGS: There is new hypoattenuation within the posteromedial right temporal lobe and right occipital lobe as well as right cerebral peduncle. Focus of hypoattenuation in the left cerebellum corresponds to acute infarct described on recent MRI. There is no evidence of intracranial hemorrhage. Patchy areas of hypoattenuation within the white matter of both cerebral hemispheres are compatible with moderate chronic small vessel ischemic disease and similar to the prior study. There is no evidence of mass or midline shift. No extra-axial fluid collection.  Mastoid air cells are clear. There is mild posterior left ethmoid air cell mucosal thickening. Prior bilateral cataract surgery is noted.  IMPRESSION: Findings concerning for acute right PCA infarct.  These results were called by telephone at the time of interpretation on 04/12/2013 at 12:53 PM to Dr. Collier Salina, Janann Colonel , who verbally acknowledged these results.   Electronically Signed   By: Logan Bores   On: 04/12/2013 12:53   Mr Brain Ltd W/o Cm  04/13/2013   CLINICAL DATA:  Evaluate for new infarct.  EXAM: MRI HEAD WITHOUT CONTRAST  TECHNIQUE: Multi planar diffusion imaging of the brain and surrounding structures were obtained without intravenous contrast. Limited exam requested for clinical surface.  COMPARISON:  04/09/2011  FINDINGS: There is restricted diffusion in the entirety  of the right PCA territory, including right occipital lobe, mesial and posterior right temporal lobe, mid brain and posterior right thalamus. There is no gross hemorrhage within the new infarct. In early subacute infarct in the central left cerebellum is again seen, slightly fuller in size, now 9 mm. No acute anterior circulation infarct is seen. There is remote bilateral border zone infarcts in this patient with chronic right ICA occlusion.  These results were called by telephone at the time of interpretation on 04/13/2013 at 12:59 PM to Dr. Mikey Bussing , who verbally acknowledged these results.  IMPRESSION: 1. New, acute right PCA territory infarction. 2. Pre-existing left cerebellar infarct is mildly larger.   Electronically Signed   By: Jorje Guild M.D.   On: 04/13/2013 13:02    Scheduled Meds: . amLODipine  10 mg Oral Daily  . aspirin  325 mg Oral Daily  . atenolol  25 mg Oral BID  .  cloNIDine  0.3 mg Transdermal Q Tue  . enoxaparin (LOVENOX) injection  40 mg Subcutaneous QHS  . fesoterodine  8 mg Oral Daily  . hydrALAZINE  75 mg Oral 3 times per day  . insulin aspart  0-5 Units Subcutaneous QHS  . insulin aspart  0-9 Units Subcutaneous TID WC  . Placebo/Aspirin-Acetylsalicylic acid  1 tablet Oral Daily  . Placebo/Ticagrelor  1 tablet Oral BID  . simvastatin  10 mg Oral q1800   Continuous Infusions:    Principal Problem:   Acute ischemic stroke Active Problems:   Hypertension, uncontrolled   Occlusion and stenosis of carotid artery with cerebral infarction   Anxiety state, unspecified   CKD (chronic kidney disease) stage 3, GFR 30-59 ml/min   Depression   Diplopia   Left-sided weakness   Vertigo   Paresthesias in left hand   Pre-diabetes   Cerebellar stroke   Dizziness and giddiness   Stroke    Time spent: [place] MD [facility] Pager [phone]. If 7PM-7AM, please contact night-coverage at [email or web address], password [facility] 04/13/2013, 2:06  PM  LOS: 5 days

## 2013-04-13 NOTE — Clinical Social Work Psychosocial (Signed)
Clinical Social Work Department BRIEF PSYCHOSOCIAL ASSESSMENT 04/13/2013  Patient:  Kelli [name], Kelli [name]     Account Number:  [id number]     Admit date:  04/08/2013  Clinical Social Worker:  Donna Christen  Date/Time:  04/13/2013 10:56 AM  Referred by:  Physician  Date Referred:  04/13/2013 Referred for  SNF Placement   Other Referral:   none.   Interview type:  Patient Other interview type:   Pt's friend and son were at bedside.    PSYCHOSOCIAL DATA Living Status:  HUSBAND Admitted from facility:   Level of care:   Primary support name:  Kelli [name] Primary support relationship to patient:  SPOUSE Degree of support available:   Pt stated that husband is able to care for pt at home, but works during the day in [employer]. Pt has three sons, but at this time no plan has been created for support for pt during the day while pt's husband is at work.    CURRENT CONCERNS Current Concerns  Post-Acute Placement   Other Concerns:   none.    SOCIAL WORK ASSESSMENT / PLAN CSW met with pt and pt's friend and pt's son at bedside. CSW defined CSW role at [facility]. Pt was agreeable to speaking with CSW. CSW explained how CIR has stated that they are unsure of being able to admit pt due to lack of support at home once discharged from CIR. CSW discussed SNF as a back-up option if CIR becomes unavailiable as a discharge plan. Pt stated that she did not want to be placed in a SNF, and would like to go home if she could not be admitted to CIR. After speaking with pt, pt's friend, and pt's son, pt was agreeable to [place] faxing clinical information to [facility], but stated that it does not mean she would go to SNF if CIR denied admission. Unit CSW to leave handoff for weekend CSW to present bed offers to pt on Saturday, 04/14/2013. CSW will continue to follow.   Assessment/plan status:  Psychosocial Support/Ongoing Assessment of Needs Other assessment/ plan:   none.    Information/referral to community resources:   [facility] bed offers.    PATIENTS/FAMILYS RESPONSE TO PLAN OF CARE: Pt is not 100% agreeable to CSW plan of care. As mentioned above, pt stated that she is agreeable to seeing what SNF options are available in [facility] but does not want to commit to saying she will be placed in SNF. Pt was complaining of pain and seemed distressed throughout conversation. Pt under impression that she would be safe at home without her husband present throughout the day.       Kelli [name], [place] Social Worker [phone]

## 2013-04-14 LAB — BASIC METABOLIC PANEL
BUN: 27 mg/dL — ABNORMAL HIGH (ref 6–23)
CALCIUM: 9 mg/dL (ref 8.4–10.5)
CO2: 24 meq/L (ref 19–32)
Chloride: 109 mEq/L (ref 96–112)
Creatinine, Ser: 1.49 mg/dL — ABNORMAL HIGH (ref 0.50–1.10)
GFR calc Af Amer: 42 mL/min — ABNORMAL LOW (ref >90)
GFR calc non Af Amer: 36 mL/min — ABNORMAL LOW (ref >90)
GLUCOSE: 127 mg/dL — AB (ref 70–99)
Potassium: 3.7 mEq/L (ref 3.7–5.3)
Sodium: 146 mEq/L (ref 137–147)

## 2013-04-14 LAB — GLUCOSE, CAPILLARY
GLUCOSE-CAPILLARY: 130 mg/dL — AB (ref 70–99)
Glucose-Capillary: 129 mg/dL — ABNORMAL HIGH (ref 70–99)
Glucose-Capillary: 124 mg/dL — ABNORMAL HIGH (ref 70–99)
Glucose-Capillary: 111 mg/dL — ABNORMAL HIGH (ref 70–99)

## 2013-04-14 MED ORDER — POLYETHYLENE GLYCOL 3350 17 G PO PACK
17.0000 g | PACK | Freq: Two times a day (BID) | ORAL | Status: DC
  Administered 2013-04-14 – 2013-04-17 (×4): 17 g via ORAL
  Filled 2013-04-14 (×7): qty 1

## 2013-04-14 MED ORDER — SENNOSIDES-DOCUSATE SODIUM 8.6-50 MG PO TABS
1.0000 | ORAL_TABLET | Freq: Every day | ORAL | Status: DC
  Administered 2013-04-15 – 2013-04-16 (×2): 1 via ORAL
  Filled 2013-04-14 (×2): qty 1

## 2013-04-14 MED ORDER — MAGNESIUM CITRATE PO SOLN
1.0000 | Freq: Every day | ORAL | Status: DC | PRN
  Filled 2013-04-14: qty 296

## 2013-04-14 NOTE — Progress Notes (Signed)
[facility] PROGRESS NOTE  Kelli [name] [id number] DOB: 04/10/1950 DOA: 04/08/2013 PCP: Manon Hilding, MD  Assessment/Plan: #1Acute left cerebellar stroke/ new acute right PCA territory infarction -History of left carotid endarterectomy  -MRI brain shows no flow in the right PCA, but not associated with infarct in this territory  -LDL 90--patient was on simvastatin 10 mg prior to admission   2 d echocardiogram with no source of emboli. Carotid Dopplers with no significant ICA stenosis. -PT evaluation  -Urine drug screen and alcohol level negative  -Repeat CT brain 04/10/13 due to RN report of worsen LUE/LLE weak is negative for any acute infarct. Patient with left sided weakness. Repeat CT head per neurology is negative. MRI is consistent with a new right PCA infarction. Concern for embolic etiology. Patient will be off trial medication at this time per neurology and patient on ASA 325mg  daily. Neuro following and appreciate input and recommendations. Patient for [name] and probable loop recorder to be done on Monday, 04/16/2013. Neurology is following and appreciate input and recommendations. Hypertension, poorly controlled  -Continue hydralazine to 75 mg 3 times a day  -continue atenolol to 25 mg twice a day  -Continue clonidine TTS 3  -Continue amlodipine 10 mg daily  Impaired glucose tolerance  -Hemoglobin A1c 6.4  -Lifestyle modification  Prior renal artery duplex negative for renal artery stenosis-  CKD stage III  -Review of the records reveals large variation and baseline serum creatinine  -varies from 1.4-1.9. Followup as outpatient. -Urinalysis is bland  Positive gastro-occult  -Hemoglobin has remained stable  -Continue to monitor  -Patient denies any abdominal pain, tolerating diet  History of tobacco use  -Patient quit one year ago  Anxiety disorder/fibromyalgia  -Continue Klonopin 0.5 mg twice a day    Code Status: full Family Communication: updated patient  and husband at bedside. Disposition Plan: CIR vs SNF   Consultants:  Neurology: Dr. Nicole Kindred 04/08/2013  Procedures:  CT head 04/10/2013  MRI MRA of the head 04/08/2013  2-D echo 04/10/2013  Carotid Dopplers 04/10/2013  Chest x-ray 04/08/2013, 04/09/2013  MRI head 04/13/2013   Antibiotics:  none  HPI/Subjective: Patient awake and alert following commands. Patient still with a significant left-sided hemiparesis/weakness. Patient complaining of the mouth being dry.  Objective: Filed Vitals:   04/14/13 1409  BP: 158/56  Pulse: 65  Temp: 97.9 F (36.6 C)  Resp: 20    Intake/Output Summary (Last 24 hours) at 04/14/13 1440 Last data filed at 04/13/13 1500  Gross per 24 hour  Intake      0 ml  Output      0 ml  Net      0 ml   Filed Weights   04/08/13 1507 04/08/13 2115  Weight: 97.523 kg (215 lb) 98.748 kg (217 lb 11.2 oz)    Exam:   General:  Patient awake.  Cardiovascular: regular rate and rhythm  Respiratory: clear to auscultation bilaterally  Abdomen: soft, nontender, nondistended, positive bowel sounds  Musculoskeletal: no clubbing cyanosis or edema. L sided weakness with 0/5 LUE strength, 2/5 LLE strength. Left visual field defect.  Data Reviewed: Basic Metabolic Panel:  Recent Labs Lab 04/08/13 1510 04/08/13 1522 04/09/13 0725 04/10/13 0940 04/13/13 0545 04/14/13 0528  NA 139 141 143 141 142 146  K 4.4 4.1 3.7 4.6 3.3* 3.7  CL 101 103 103 104 106 109  CO2 24  --  25 23 24 24   GLUCOSE 114* 114* 134* 186* 144* 127*  BUN 29* 29* 23  24* 24* 27*  CREATININE 1.69* 1.90* 1.40* 1.45* 1.49* 1.49*  CALCIUM 8.8  --  8.8 8.6 9.0 9.0   Liver Function Tests:  Recent Labs Lab 04/08/13 1510 04/10/13 0940  AST 16 44*  ALT 19 16  ALKPHOS 80 65  BILITOT 0.3 0.4  PROT 7.7 7.1  ALBUMIN 3.6 3.3*   No results found for this basename: LIPASE, AMYLASE,  in the last 168 hours No results found for this basename: AMMONIA,  in the last 168  hours CBC:  Recent Labs Lab 04/08/13 1510 04/08/13 1522 04/09/13 0725 04/10/13 0940 04/13/13 0545  WBC 9.8  --  9.5 11.0* 11.2*  NEUTROABS 5.9  --   --   --   --   HGB 11.5* 11.9* 11.1* 11.4* 12.1  HCT 34.9* 35.0* 33.9* 34.1* 36.9  MCV 85.5  --  85.4 85.3 85.6  PLT 300  --  269 276 276   Cardiac Enzymes:  Recent Labs Lab 04/08/13 1510  TROPONINI <0.30   BNP (last 3 results) No results found for this basename: PROBNP,  in the last 8760 hours CBG:  Recent Labs Lab 04/13/13 1126 04/13/13 1639 04/13/13 2108 04/14/13 0654 04/14/13 1137  GLUCAP 143* 124* 101* 129* 124*    No results found for this or any previous visit (from the past 240 hour(s)).   Studies: Mr Brain Ltd W/o Cm  04/13/2013   CLINICAL DATA:  Evaluate for new infarct.  EXAM: MRI HEAD WITHOUT CONTRAST  TECHNIQUE: Multi planar diffusion imaging of the brain and surrounding structures were obtained without intravenous contrast. Limited exam requested for clinical surface.  COMPARISON:  04/09/2011  FINDINGS: There is restricted diffusion in the entirety of the right PCA territory, including right occipital lobe, mesial and posterior right temporal lobe, mid brain and posterior right thalamus. There is no gross hemorrhage within the new infarct. In early subacute infarct in the central left cerebellum is again seen, slightly fuller in size, now 9 mm. No acute anterior circulation infarct is seen. There is remote bilateral border zone infarcts in this patient with chronic right ICA occlusion.  These results were called by telephone at the time of interpretation on 04/13/2013 at 12:59 PM to Dr. Mikey Bussing , who verbally acknowledged these results.  IMPRESSION: 1. New, acute right PCA territory infarction. 2. Pre-existing left cerebellar infarct is mildly larger.   Electronically Signed   By: Jorje Guild M.D.   On: 04/13/2013 13:02    Scheduled Meds: . amLODipine  10 mg Oral Daily  . antiseptic oral rinse  15 mL  Mouth Rinse BID  . aspirin  325 mg Oral Daily  . atenolol  25 mg Oral BID  . cloNIDine  0.3 mg Transdermal Q Tue  . enoxaparin (LOVENOX) injection  40 mg Subcutaneous QHS  . fesoterodine  8 mg Oral Daily  . hydrALAZINE  75 mg Oral 3 times per day  . insulin aspart  0-5 Units Subcutaneous QHS  . insulin aspart  0-9 Units Subcutaneous TID WC  . simvastatin  10 mg Oral q1800   Continuous Infusions: . sodium chloride 20 mL/hr at 04/13/13 [id number]    Principal Problem:   Acute CVA (cerebrovascular accident): R PCA infarct per MRI 04/13/13 Active Problems:   Acute ischemic stroke   Hypertension, uncontrolled   Occlusion and stenosis of carotid artery with cerebral infarction   Anxiety state, unspecified   CKD (chronic kidney disease) stage 3, GFR 30-59 ml/min   Depression   Diplopia  Left-sided weakness   Vertigo   Paresthesias in left hand   Pre-diabetes   Cerebellar stroke   Dizziness and giddiness   Stroke    Time spent: [place] MD [facility] Pager [phone]. If 7PM-7AM, please contact night-coverage at [email or web address], password [facility] 04/14/2013, 2:40 PM  LOS: 6 days

## 2013-04-14 NOTE — Progress Notes (Addendum)
Stroke Team Progress Note  HISTORY Kelli [name] is a 63 y.o. female with a history of hypertension, hyperlipidemia, watershed cerebral infarctions, right ICA occlusion and left CEA, presenting 04/08/2013 with new onset vertigo with nausea and vomiting as well as numbness involving left side of face and left upper extremity and left upper extremity weakness. Onset of symptoms was at 2:30 PM  04/08/2013. Patient had been taking aspirin 81 mg per day. She presented to the emergency room at [facility] initially. She was initially deemed a candidate for TPA. However, she became worse with increasing nausea and vomiting as well as vertigo and worsening of numbness and left-sided weakness. Speech also became somewhat slurred. CTA was recommended by Specialist on-Call neurologist. Study could not be done however because of patient's elevated creatinine. She was subsequently transferred to [facility] for MRI and MRA study to rule out basal artery thrombosis. MRI showed findings consistent with acute small left cerebellar ischemic infarction. There was no evidence of basilar artery occlusion or significant stenosis. No evidence of thrombus was seen. NIH stroke score on arrival to [facility] was 5. Patient had dark colored emesis which was guaiac positive. Patient was not administerd TPA.  SUBJECTIVE  Sill with Marked R hemiparesis. No new complaints.   OBJECTIVE Most recent Vital Signs: Filed Vitals:   04/14/13 0020 04/14/13 0225 04/14/13 0547 04/14/13 0944  BP: 171/51 172/60 193/58 180/46  Pulse: 60 62 62 55  Temp:   98.1 F (36.7 C) 98.1 F (36.7 C)  TempSrc:   Oral Oral  Resp:   20 20  Height:      Weight:      SpO2:   98% 96%   CBG (last 3)   Recent Labs  04/13/13 2108 04/14/13 0654 04/14/13 1137  GLUCAP 101* 129* 124*    IV Fluid Intake:   . sodium chloride 20 mL/hr at 04/13/13 1834    MEDICATIONS  . amLODipine  10 mg Oral Daily  . antiseptic oral rinse  15 mL Mouth Rinse BID  .  aspirin  325 mg Oral Daily  . atenolol  25 mg Oral BID  . cloNIDine  0.3 mg Transdermal Q Tue  . enoxaparin (LOVENOX) injection  40 mg Subcutaneous QHS  . fesoterodine  8 mg Oral Daily  . hydrALAZINE  75 mg Oral 3 times per day  . insulin aspart  0-5 Units Subcutaneous QHS  . insulin aspart  0-9 Units Subcutaneous TID WC  . Placebo/Aspirin-Acetylsalicylic acid  1 tablet Oral Daily  . Placebo/Ticagrelor  1 tablet Oral BID  . simvastatin  10 mg Oral q1800   PRN:  acetaminophen, acetaminophen, clonazePAM, hydrALAZINE, ondansetron (ZOFRAN) IV, prochlorperazine, RESOURCE THICKENUP CLEAR, senna-docusate, sodium chloride  Diet:  Dysphagia thin liquids Activity:  As tolerated DVT Prophylaxis:  Lovenox 40 mg sq daily   CLINICALLY SIGNIFICANT STUDIES Basic Metabolic Panel:   Recent Labs Lab 04/13/13 0545 04/14/13 0528  NA 142 146  K 3.3* 3.7  CL 106 109  CO2 24 24  GLUCOSE 144* 127*  BUN 24* 27*  CREATININE 1.49* 1.49*  CALCIUM 9.0 9.0   Liver Function Tests:   Recent Labs Lab 04/08/13 1510 04/10/13 0940  AST 16 44*  ALT 19 16  ALKPHOS 80 65  BILITOT 0.3 0.4  PROT 7.7 7.1  ALBUMIN 3.6 3.3*   CBC:   Recent Labs Lab 04/08/13 1510  04/10/13 0940 04/13/13 0545  WBC 9.8  < > 11.0* 11.2*  NEUTROABS 5.9  --   --   --  HGB 11.5*  < > 11.4* 12.1  HCT 34.9*  < > 34.1* 36.9  MCV 85.5  < > 85.3 85.6  PLT 300  < > 276 276  < > = values in this interval not displayed. Coagulation:   Recent Labs Lab 04/08/13 1510  LABPROT 12.7  INR 0.97   Cardiac Enzymes:   Recent Labs Lab 04/08/13 1510  TROPONINI <0.30   Urinalysis:   Recent Labs Lab 04/08/13 1623  COLORURINE YELLOW  LABSPEC 1.020  PHURINE 7.0  GLUCOSEU NEGATIVE  HGBUR NEGATIVE  BILIRUBINUR NEGATIVE  KETONESUR NEGATIVE  PROTEINUR 100*  UROBILINOGEN 0.2  NITRITE NEGATIVE  LEUKOCYTESUR NEGATIVE   Lipid Panel    Component Value Date/Time   CHOL 171 04/09/2013 0725   TRIG 241* 04/09/2013 0725   HDL  33* 04/09/2013 0725   CHOLHDL 5.2 04/09/2013 0725   VLDL 48* 04/09/2013 0725   LDLCALC 90 04/09/2013 0725   HgbA1C  Lab Results  Component Value Date   HGBA1C 6.4* 04/09/2013    Urine Drug Screen:     Component Value Date/Time   LABOPIA NONE DETECTED 04/08/2013 1624   COCAINSCRNUR NONE DETECTED 04/08/2013 1624   LABBENZ NONE DETECTED 04/08/2013 1624   AMPHETMU NONE DETECTED 04/08/2013 1624   THCU NONE DETECTED 04/08/2013 1624   LABBARB NONE DETECTED 04/08/2013 1624    Alcohol Level:   Recent Labs Lab 04/08/13 1510  ETH <11   CT of the brain   04/10/2013  The known recent left cerebellar infarct is better define currently than on studies obtained 2 days prior. There is extensive  supratentorial small vessel disease as well as a prior small left parietal lobe infarct. No convincing new infarct is seen in these areas, although a small infarct could easily be obscured on CT by the degree of underlying small vessel disease. There is no acute hemorrhage or mass effect. The middle cerebral arteries do not show increased attenuation on either side.  04/08/2013   : 1. No acute abnormality. 2. Stable chronic small vessel white matter ischemic changes and old lacunar infarcts.   MRI of the brain  04/08/2013  Sub cm acute infarction within the left cerebellum. No other acute infarction. Extensive old ischemic changes throughout the hemispheric white matter with watershed infarction pattern on the right.   Limited MRI of the brain without contrast 04/12/2013  1. New, acute right PCA territory infarction.  2. Pre-existing left cerebellar infarct is mildly larger.  MRA of the brain  04/08/2013  Chronic right internal carotid artery occlusion. Anterior circulation supply occurs via a widely patent left internal carotid artery with patent anterior communicating artery.  Widely patent dominant right vertebral artery. Reconstituted flow in the left vertebral artery.  No flow demonstrated in the right posterior cerebral  artery on today's study. This is new since the previous exam. However, no infarction is seen in the right PCA territory as might be expected.       2D Echocardiogram  EF 55-60% with no source of embolus.   Carotid Doppler  Left: CEA patent. 1-39% ICA stenosis. Right: Vertebral artery flow is antegrade. Left: Vertebral artery flow is retrograde.   CXR   04/09/2013 No acute cardiopulmonary disease. 04/08/2013  No acute cardiopulmonary disease.     EKG  normal sinus rhythm. For complete results please see formal report.   Therapy Recommendations - inpatient rehabilitation recommended. The patient is being considered.  Physical Exam   Middle aged Caucasian lady  In no distress  Afebrile. Head is nontraumatic. Neck is supple without bruit. Hearing is normal. Cardiac exam no murmur or gallop. Lungs are clear to auscultation. Distal pulses are well felt. Neurological Exam : Drowsy but opens eyes easily and follows commands. Mild dysarthria no aphasia. PERRL, EOMI, decreased blink to threat on the left,  no nystagmus. Mild left lower facial asymmetry. Tongue midline. Motor system exam reveals marked LUE 2/5 and now LLE weakness  0/5 Sensation is preserved. Gait was not tested.    ASSESSMENT Kelli [name] is a 63 y.o. female presenting with left sided numbness and weakness, progressing to nausea and vomiting. Imaging confirms a left cerebellar infarct. Infarct felt to be thrombotic secondary to small vessel disease.  Waxing and waning of left hemiparesis continues - no neuro or medical etiology for lethargy CT unchanged yesterday. Lethargy and left hemiparesis worsening may be related to lack of sleep. On aspirin 81 mg orally every day prior to admission. Now  enrolled in Socrates, ticagrelor vs aspirin for secondary stroke prevention. Patient with resultantmild LUE weakness, nausea and vomiting. Work up completed.   04/12/2013  1. New, acute right PCA territory infarction.  2. Pre-existing left  cerebellar infarct is mildly larger.   Nausea and vomiting, lethargic post phenergan, remains lethargic now. On arrival, [name] emesis was gastric occult positive, however no overt bleeding was noted. Hypertension - recently stopped labetalol and switched to atenolol at pt request. Blood pressure running high at times. Continue to monitor. Hyperlipidemia, LDL 126, on no statin PTA, now on statin, goal LDL < 100 - Now on Zocor. Chronic kidney disease, stage 3 - follow up with nephrology planned in March. Holding off ARB or ACE Depression - hx of. Dr. Domenic Polite concerned it may need further management Fibromyalgia  Cigarette smoker  Obesity, Body mass index is 36.23 kg/(m^2).  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 S/p L CEA on 10/03/2012 by Dr. Donnetta Hutching. No hx heart failure - ejection fraction 55-60% by recent echo Obstructive sleep apnea - needs formal dx Headache - Limited MRI - 1. New, acute right PCA territory infarction. 2. Pre-existing left cerebellar infarct is mildly larger.  Per discussion with research coordinator, will hold off on trial medication at this time and start ASA 325mg  daily. Research coordinator to discuss further with family today  Hospital day # 6  TREATMENT/PLAN  Continue Socrates trial, ticagrelor vs aspirin for secondary stroke prevention at this time. Defer continuation to pt and Research officer, political party. There was a report of a rash thought secondary to trial drug. Discussed with Research officer, political party. The plan at this time is to continue aspirin 325 mg daily.  Ok to be OOB with therapy  Limited MRI of the brain - based on the findings [name] and loop will be scheduled for Monday. Cardiology has been contacted.  Will follow for disposition recommendations. - inpatient rehabilitation is being considered.  Mikey Bussing PA-C [facility] Pager  [phone] 04/14/2013, 1:30 PM   I have personally obtained a history, examined the patient, evaluated imaging results, and formulated the assessment and plan of care. I agree with the above.

## 2013-04-15 LAB — GLUCOSE, CAPILLARY
GLUCOSE-CAPILLARY: 136 mg/dL — AB (ref 70–99)
GLUCOSE-CAPILLARY: 117 mg/dL — AB (ref 70–99)
GLUCOSE-CAPILLARY: 99 mg/dL (ref 70–99)
Glucose-Capillary: 153 mg/dL — ABNORMAL HIGH (ref 70–99)

## 2013-04-15 MED ORDER — HYDRALAZINE HCL 50 MG PO TABS
75.0000 mg | ORAL_TABLET | Freq: Three times a day (TID) | ORAL | Status: DC
  Administered 2013-04-15 – 2013-04-17 (×5): 75 mg via ORAL
  Filled 2013-04-15 (×8): qty 1

## 2013-04-15 MED ORDER — HYDRALAZINE HCL 50 MG PO TABS
100.0000 mg | ORAL_TABLET | Freq: Three times a day (TID) | ORAL | Status: DC
  Filled 2013-04-15 (×3): qty 2

## 2013-04-15 NOTE — Progress Notes (Signed)
[facility] PROGRESS NOTE  Kelli PIETROPAOLO [id number] DOB: March 05, 1950 DOA: 04/08/2013 PCP: Manon Hilding, MD  Assessment/Plan: #1Acute left cerebellar stroke/ new acute right PCA territory infarction -History of left carotid endarterectomy  -MRI brain shows no flow in the right PCA, but not associated with infarct in this territory  -LDL 90--patient was on simvastatin 10 mg prior to admission   2 d echocardiogram with no source of emboli. Carotid Dopplers with no significant ICA stenosis. -PT evaluation  -Urine drug screen and alcohol level negative  -Repeat CT brain 04/10/13 due to RN report of worsen LUE/LLE weak is negative for any acute infarct. Patient with left sided weakness. Repeat CT head per neurology is negative. MRI is consistent with a new right PCA infarction. Concern for embolic etiology. Patient will be off trial medication at this time per neurology and patient on ASA 325mg  daily. Neuro following and appreciate input and recommendations. Patient for [name] and probable loop recorder to be done on Monday, 04/16/2013. Neurology is following and appreciate input and recommendations. Hypertension, poorly controlled  -Increase hydralazine to 100 mg 3 times a day  -continue atenolol to 25 mg twice a day  -Continue clonidine TTS 3  -Continue amlodipine 10 mg daily  Impaired glucose tolerance  -Hemoglobin A1c 6.4  -Lifestyle modification  Prior renal artery duplex negative for renal artery stenosis-  CKD stage III  -Review of the records reveals large variation and baseline serum creatinine  -varies from 1.4-1.9. Followup as outpatient. -Urinalysis is bland  Positive gastro-occult  -Hemoglobin has remained stable  -Continue to monitor  -Patient denies any abdominal pain, tolerating diet  History of tobacco use  -Patient quit one year ago  Anxiety disorder/fibromyalgia  -Continue Klonopin 0.5 mg twice a day    Code Status: full Family Communication: updated patient  and husband at bedside. Disposition Plan: CIR vs SNF   Consultants:  Neurology: Dr. Nicole Kindred 04/08/2013  Procedures:  CT head 04/10/2013  MRI MRA of the head 04/08/2013  2-D echo 04/10/2013  Carotid Dopplers 04/10/2013  Chest x-ray 04/08/2013, 04/09/2013  MRI head 04/13/2013   Antibiotics:  none  HPI/Subjective: Patient awake and alert following commands. Patient still with a significant left-sided hemiparesis/weakness. Patient complaining of the mouth being dry.  Objective: Filed Vitals:   04/15/13 0954  BP: 200/70  Pulse: 60  Temp: 98.3 F (36.8 C)  Resp: 18   No intake or output data in the 24 hours ending 04/15/13 1057 Filed Weights   04/08/13 1507 04/08/13 2115  Weight: 97.523 kg (215 lb) 98.748 kg (217 lb 11.2 oz)    Exam:   General:  Patient awake.  Cardiovascular: regular rate and rhythm  Respiratory: clear to auscultation bilaterally  Abdomen: soft, nontender, nondistended, positive bowel sounds  Musculoskeletal: no clubbing cyanosis or edema. L sided weakness with 0/5 LUE strength, 2/5 LLE strength. Left visual field defect.  Data Reviewed: Basic Metabolic Panel:  Recent Labs Lab 04/08/13 1510 04/08/13 1522 04/09/13 0725 04/10/13 0940 04/13/13 0545 04/14/13 0528  NA 139 141 143 141 142 146  K 4.4 4.1 3.7 4.6 3.3* 3.7  CL 101 103 103 104 106 109  CO2 24  --  25 23 24 24   GLUCOSE 114* 114* 134* 186* 144* 127*  BUN 29* 29* 23 24* 24* 27*  CREATININE 1.69* 1.90* 1.40* 1.45* 1.49* 1.49*  CALCIUM 8.8  --  8.8 8.6 9.0 9.0   Liver Function Tests:  Recent Labs Lab 04/08/13 1510 04/10/13 0940  AST 16  44*  ALT 19 16  ALKPHOS 80 65  BILITOT 0.3 0.4  PROT 7.7 7.1  ALBUMIN 3.6 3.3*   No results found for this basename: LIPASE, AMYLASE,  in the last 168 hours No results found for this basename: AMMONIA,  in the last 168 hours CBC:  Recent Labs Lab 04/08/13 1510 04/08/13 1522 04/09/13 0725 04/10/13 0940 04/13/13 0545  WBC  9.8  --  9.5 11.0* 11.2*  NEUTROABS 5.9  --   --   --   --   HGB 11.5* 11.9* 11.1* 11.4* 12.1  HCT 34.9* 35.0* 33.9* 34.1* 36.9  MCV 85.5  --  85.4 85.3 85.6  PLT 300  --  269 276 276   Cardiac Enzymes:  Recent Labs Lab 04/08/13 1510  TROPONINI <0.30   BNP (last 3 results) No results found for this basename: PROBNP,  in the last 8760 hours CBG:  Recent Labs Lab 04/14/13 0654 04/14/13 1137 04/14/13 1656 04/14/13 2115 04/15/13 0644  GLUCAP 129* 124* 130* 111* 153*    No results found for this or any previous visit (from the past 240 hour(s)).   Studies: Mr Brain Ltd W/o Cm  04/13/2013   CLINICAL DATA:  Evaluate for new infarct.  EXAM: MRI HEAD WITHOUT CONTRAST  TECHNIQUE: Multi planar diffusion imaging of the brain and surrounding structures were obtained without intravenous contrast. Limited exam requested for clinical surface.  COMPARISON:  04/09/2011  FINDINGS: There is restricted diffusion in the entirety of the right PCA territory, including right occipital lobe, mesial and posterior right temporal lobe, mid brain and posterior right thalamus. There is no gross hemorrhage within the new infarct. In early subacute infarct in the central left cerebellum is again seen, slightly fuller in size, now 9 mm. No acute anterior circulation infarct is seen. There is remote bilateral border zone infarcts in this patient with chronic right ICA occlusion.  These results were called by telephone at the time of interpretation on 04/13/2013 at 12:59 PM to Dr. Mikey Bussing , who verbally acknowledged these results.  IMPRESSION: 1. New, acute right PCA territory infarction. 2. Pre-existing left cerebellar infarct is mildly larger.   Electronically Signed   By: Jorje Guild M.D.   On: 04/13/2013 13:02    Scheduled Meds: . amLODipine  10 mg Oral Daily  . antiseptic oral rinse  15 mL Mouth Rinse BID  . aspirin  325 mg Oral Daily  . atenolol  25 mg Oral BID  . cloNIDine  0.3 mg Transdermal Q  Tue  . enoxaparin (LOVENOX) injection  40 mg Subcutaneous QHS  . fesoterodine  8 mg Oral Daily  . hydrALAZINE  75 mg Oral 3 times per day  . insulin aspart  0-5 Units Subcutaneous QHS  . insulin aspart  0-9 Units Subcutaneous TID WC  . polyethylene glycol  17 g Oral BID  . senna-docusate  1 tablet Oral QHS  . simvastatin  10 mg Oral q1800   Continuous Infusions: . sodium chloride 20 mL/hr at 04/13/13 [id number]    Principal Problem:   Acute CVA (cerebrovascular accident): R PCA infarct per MRI 04/13/13 Active Problems:   Acute ischemic stroke   Hypertension, uncontrolled   Occlusion and stenosis of carotid artery with cerebral infarction   Anxiety state, unspecified   CKD (chronic kidney disease) stage 3, GFR 30-59 ml/min   Depression   Diplopia   Left-sided weakness   Vertigo   Paresthesias in left hand   Pre-diabetes   Cerebellar stroke  Dizziness and giddiness   Stroke    Time spent: [place] MD [facility] Pager [phone]. If 7PM-7AM, please contact night-coverage at [email or web address], password [facility] 04/15/2013, 10:57 AM  LOS: 7 days

## 2013-04-15 NOTE — Progress Notes (Signed)
Stroke Team Progress Note  HISTORY Kelli [name] is a 63 y.o. female with a history of hypertension, hyperlipidemia, watershed cerebral infarctions, right ICA occlusion and left CEA, presenting 04/08/2013 with new onset vertigo with nausea and vomiting as well as numbness involving left side of face and left upper extremity and left upper extremity weakness. Onset of symptoms was at 2:30 PM  04/08/2013. Patient had been taking aspirin 81 mg per day. She presented to the emergency room at [facility] initially. She was initially deemed a candidate for TPA. However, she became worse with increasing nausea and vomiting as well as vertigo and worsening of numbness and left-sided weakness. Speech also became somewhat slurred. CTA was recommended by Specialist on-Call neurologist. Study could not be done however because of patient's elevated creatinine. She was subsequently transferred to [facility] for MRI and MRA study to rule out basal artery thrombosis. MRI showed findings consistent with acute small left cerebellar ischemic infarction. There was no evidence of basilar artery occlusion or significant stenosis. No evidence of thrombus was seen. NIH stroke score on arrival to [facility] was 5. Patient had dark colored emesis which was guaiac positive. Patient was not administerd TPA.  SUBJECTIVE   The nurse reports that the patient drowsy this morning. She also complained of having neck pain. The patient apparently had a panic attack this morning with complaints of being anxious and crying. She was given clonazepam at 10 AM this morning. She is currently being examined at about 2. She does appears more drowsy than yesterday. The nurse also reports that she has difficulties keeping her eye opening.   OBJECTIVE Most recent Vital Signs: Filed Vitals:   04/15/13 0115 04/15/13 0542 04/15/13 0954 04/15/13 1336  BP: 117/78 145/76 200/70 100/69  Pulse: 62 60 60 60  Temp: 99.1 F (37.3 C) 99 F (37.2 C) 98.3 F (36.8  C) 98.4 F (36.9 C)  TempSrc: Oral Oral Oral Oral  Resp: 18 18 18 20   Height:      Weight:      SpO2: 98% 98% 95% 94%   CBG (last 3)   Recent Labs  04/14/13 2115 04/15/13 0644 04/15/13 1153  GLUCAP 111* 153* 136*    IV Fluid Intake:   . sodium chloride 20 mL/hr at 04/13/13 1834    MEDICATIONS  . amLODipine  10 mg Oral Daily  . antiseptic oral rinse  15 mL Mouth Rinse BID  . aspirin  325 mg Oral Daily  . atenolol  25 mg Oral BID  . cloNIDine  0.3 mg Transdermal Q Tue  . enoxaparin (LOVENOX) injection  40 mg Subcutaneous QHS  . fesoterodine  8 mg Oral Daily  . hydrALAZINE  100 mg Oral 3 times per day  . insulin aspart  0-5 Units Subcutaneous QHS  . insulin aspart  0-9 Units Subcutaneous TID WC  . polyethylene glycol  17 g Oral BID  . senna-docusate  1 tablet Oral QHS  . simvastatin  10 mg Oral q1800   PRN:  acetaminophen, acetaminophen, clonazePAM, hydrALAZINE, magnesium citrate, ondansetron (ZOFRAN) IV, prochlorperazine, RESOURCE THICKENUP CLEAR, sodium chloride  Diet:  Dysphagia thin liquids Activity:  As tolerated DVT Prophylaxis:  Lovenox 40 mg sq daily   CLINICALLY SIGNIFICANT STUDIES Basic Metabolic Panel:   Recent Labs Lab 04/13/13 0545 04/14/13 0528  NA 142 146  K 3.3* 3.7  CL 106 109  CO2 24 24  GLUCOSE 144* 127*  BUN 24* 27*  CREATININE 1.49* 1.49*  CALCIUM 9.0  9.0   Liver Function Tests:   Recent Labs Lab 04/08/13 1510 04/10/13 0940  AST 16 44*  ALT 19 16  ALKPHOS 80 65  BILITOT 0.3 0.4  PROT 7.7 7.1  ALBUMIN 3.6 3.3*   CBC:   Recent Labs Lab 04/08/13 1510  04/10/13 0940 04/13/13 0545  WBC 9.8  < > 11.0* 11.2*  NEUTROABS 5.9  --   --   --   HGB 11.5*  < > 11.4* 12.1  HCT 34.9*  < > 34.1* 36.9  MCV 85.5  < > 85.3 85.6  PLT 300  < > 276 276  < > = values in this interval not displayed. Coagulation:   Recent Labs Lab 04/08/13 1510  LABPROT 12.7  INR 0.97   Cardiac Enzymes:   Recent Labs Lab 04/08/13 1510   TROPONINI <0.30   Urinalysis:   Recent Labs Lab 04/08/13 1623  COLORURINE YELLOW  LABSPEC 1.020  PHURINE 7.0  GLUCOSEU NEGATIVE  HGBUR NEGATIVE  BILIRUBINUR NEGATIVE  KETONESUR NEGATIVE  PROTEINUR 100*  UROBILINOGEN 0.2  NITRITE NEGATIVE  LEUKOCYTESUR NEGATIVE   Lipid Panel    Component Value Date/Time   CHOL 171 04/09/2013 0725   TRIG 241* 04/09/2013 0725   HDL 33* 04/09/2013 0725   CHOLHDL 5.2 04/09/2013 0725   VLDL 48* 04/09/2013 0725   LDLCALC 90 04/09/2013 0725   HgbA1C  Lab Results  Component Value Date   HGBA1C 6.4* 04/09/2013    Urine Drug Screen:     Component Value Date/Time   LABOPIA NONE DETECTED 04/08/2013 1624   COCAINSCRNUR NONE DETECTED 04/08/2013 1624   LABBENZ NONE DETECTED 04/08/2013 1624   AMPHETMU NONE DETECTED 04/08/2013 1624   THCU NONE DETECTED 04/08/2013 1624   LABBARB NONE DETECTED 04/08/2013 1624    Alcohol Level:   Recent Labs Lab 04/08/13 1510  ETH <11   CT of the brain   04/10/2013  The known recent left cerebellar infarct is better define currently than on studies obtained 2 days prior. There is extensive  supratentorial small vessel disease as well as a prior small left parietal lobe infarct. No convincing new infarct is seen in these areas, although a small infarct could easily be obscured on CT by the degree of underlying small vessel disease. There is no acute hemorrhage or mass effect. The middle cerebral arteries do not show increased attenuation on either side.  04/08/2013   : 1. No acute abnormality. 2. Stable chronic small vessel white matter ischemic changes and old lacunar infarcts.   MRI of the brain  04/08/2013  Sub cm acute infarction within the left cerebellum. No other acute infarction. Extensive old ischemic changes throughout the hemispheric white matter with watershed infarction pattern on the right.   Limited MRI of the brain without contrast 04/12/2013  1. New, acute right PCA territory infarction.  2. Pre-existing left cerebellar  infarct is mildly larger.  MRA of the brain  04/08/2013  Chronic right internal carotid artery occlusion. Anterior circulation supply occurs via a widely patent left internal carotid artery with patent anterior communicating artery.  Widely patent dominant right vertebral artery. Reconstituted flow in the left vertebral artery.  No flow demonstrated in the right posterior cerebral artery on today's study. This is new since the previous exam. However, no infarction is seen in the right PCA territory as might be expected.       2D Echocardiogram  EF 55-60% with no source of embolus.   Carotid Doppler  Left: CEA  patent. 1-39% ICA stenosis. Right: Vertebral artery flow is antegrade. Left: Vertebral artery flow is retrograde.   CXR   04/09/2013 No acute cardiopulmonary disease. 04/08/2013  No acute cardiopulmonary disease.     EKG  normal sinus rhythm. For complete results please see formal report.   Therapy Recommendations - inpatient rehabilitation recommended. The patient is being considered.  Physical Exam   Middle aged Caucasian lady  In no distress  Afebrile. Head is nontraumatic. Neck is supple without bruit. Hearing is normal. Cardiac exam no murmur or gallop. Lungs are clear to auscultation. Distal pulses are well felt. She is sleeping and appears to have witnessed apnea suggestive of sleep apnea. Neurological Exam : She is quite drowsy today. She comes her eyes momentarily to mild sternal rub. She seemed to have apraxia of eyelid opening but a follow commands seen to arise are closed. Mild dysarthria no aphasia. PERRL, EOMI, decreased blink to threat on the left,  no nystagmus. Mild left lower facial asymmetry. Tongue midline. Motor system exam reveals marked LUE 2/5 and now LLE weakness  0/5 Sensation is preserved. Gait was not tested. Left upgoing toe.   ASSESSMENT Kelli [name] is a 63 y.o. female presenting with left sided numbness and weakness, progressing to nausea and vomiting.  Imaging confirms a left cerebellar infarct. Infarct felt to be thrombotic secondary to small vessel disease.  Waxing and waning of left hemiparesis continues - no neuro or medical etiology for lethargy CT unchanged yesterday. Lethargy and left hemiparesis worsening may be related to lack of sleep. On aspirin 81 mg orally every day prior to admission. Now  enrolled in Socrates, ticagrelor vs aspirin for secondary stroke prevention. Patient with resultantmild LUE weakness, nausea and vomiting. Work up completed.   04/12/2013  1. New, acute right PCA territory infarction.  2. Pre-existing left cerebellar infarct is mildly larger.   Nausea and vomiting, lethargic post phenergan, remains lethargic now. On arrival, [name] emesis was gastric occult positive, however no overt bleeding was noted. Hypertension - recently stopped labetalol and switched to atenolol at pt request. Blood pressure running high at times. Continue to monitor. Hyperlipidemia, LDL 126, on no statin PTA, now on statin, goal LDL < 100 - Now on Zocor. Chronic kidney disease, stage 3 - follow up with nephrology planned in March. Holding off ARB or ACE Depression - hx of. Dr. Domenic Polite concerned it may need further management Fibromyalgia  Cigarette smoker  Obesity, Body mass index is 36.23 kg/(m^2).  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 S/p L CEA on 10/03/2012 by Dr. Donnetta Hutching. No hx heart failure - ejection fraction 55-60% by recent echo Obstructive sleep apnea - needs formal dx Headache - Limited MRI - 1. New, acute right PCA territory infarction. 2. Pre-existing left cerebellar infarct is mildly larger. Suspected sleep apnea syndrome. Consider outpatient sleep study.  Per discussion with research coordinator, will hold off on trial medication at this time and start ASA 325mg  daily. Research coordinator to discuss further  with family today  Hospital day # 7  TREATMENT/PLAN  Continue Socrates trial, ticagrelor vs aspirin for secondary stroke prevention at this time. Defer continuation to pt and Research officer, political party. There was a report of a rash thought secondary to trial drug. Discussed with Research officer, political party. The plan at this time is to continue aspirin 325 mg daily.  Ok to be OOB with therapy  Limited MRI of the brain - based on the findings [name] and loop will be scheduled for Monday. Cardiology has been contacted.  Will follow for disposition recommendations. - inpatient rehabilitation is being considered.  Mikey Bussing PA-C [facility] Pager [phone] 04/15/2013, 1:53 PM   I have personally obtained a history, examined the patient, evaluated imaging results, and formulated the assessment and plan of care. I agree with the above.

## 2013-04-16 ENCOUNTER — Encounter (HOSPITAL_COMMUNITY): Payer: Self-pay

## 2013-04-16 ENCOUNTER — Encounter (HOSPITAL_COMMUNITY)
Admission: EM | Disposition: A | Discharge status: Rehabilitation Facility | Payer: Self-pay | Source: Home / Self Care | Attending: Internal Medicine

## 2013-04-16 DIAGNOSIS — I6350 Cerebral infarction due to unspecified occlusion or stenosis of unspecified cerebral artery: Secondary | ICD-10-CM

## 2013-04-16 DIAGNOSIS — I6789 Other cerebrovascular disease: Secondary | ICD-10-CM

## 2013-04-16 HISTORY — PX: LOOP RECORDER IMPLANT: SHX5954

## 2013-04-16 HISTORY — PX: TEE WITHOUT CARDIOVERSION: SHX5443

## 2013-04-16 LAB — BASIC METABOLIC PANEL
BUN: 31 mg/dL — ABNORMAL HIGH (ref 6–23)
BUN: 32 mg/dL — ABNORMAL HIGH (ref 6–23)
CALCIUM: 9.1 mg/dL (ref 8.4–10.5)
CHLORIDE: 107 meq/L (ref 96–112)
CO2: 24 mEq/L (ref 19–32)
CO2: 24 mEq/L (ref 19–32)
CREATININE: 1.57 mg/dL — AB (ref 0.50–1.10)
CREATININE: 1.57 mg/dL — AB (ref 0.50–1.10)
Calcium: 9.3 mg/dL (ref 8.4–10.5)
Chloride: 108 mEq/L (ref 96–112)
GFR calc non Af Amer: 34 mL/min — ABNORMAL LOW (ref >90)
GFR calc non Af Amer: 34 mL/min — ABNORMAL LOW (ref >90)
GFR, EST AFRICAN AMERICAN: 40 mL/min — AB (ref >90)
GFR, EST AFRICAN AMERICAN: 40 mL/min — AB (ref >90)
Glucose, Bld: 126 mg/dL — ABNORMAL HIGH (ref 70–99)
Glucose, Bld: 148 mg/dL — ABNORMAL HIGH (ref 70–99)
POTASSIUM: 4.2 meq/L (ref 3.7–5.3)
Potassium: 3.8 mEq/L (ref 3.7–5.3)
Sodium: 144 mEq/L (ref 137–147)
Sodium: 147 mEq/L (ref 137–147)

## 2013-04-16 LAB — GLUCOSE, CAPILLARY
GLUCOSE-CAPILLARY: 151 mg/dL — AB (ref 70–99)
Glucose-Capillary: 127 mg/dL — ABNORMAL HIGH (ref 70–99)
Glucose-Capillary: 113 mg/dL — ABNORMAL HIGH (ref 70–99)
Glucose-Capillary: 122 mg/dL — ABNORMAL HIGH (ref 70–99)

## 2013-04-16 LAB — CBC
HEMATOCRIT: 39.2 % (ref 36.0–46.0)
HEMOGLOBIN: 12.8 g/dL (ref 12.0–15.0)
MCH: 28.4 pg (ref 26.0–34.0)
MCHC: 32.7 g/dL (ref 30.0–36.0)
MCV: 87.1 fL (ref 78.0–100.0)
Platelets: 312 10*3/uL (ref 150–400)
RBC: 4.5 MIL/uL (ref 3.87–5.11)
RDW: 13.8 % (ref 11.5–15.5)
WBC: 9.8 10*3/uL (ref 4.0–10.5)

## 2013-04-16 SURGERY — ECHOCARDIOGRAM, TRANSESOPHAGEAL
Anesthesia: Moderate Sedation

## 2013-04-16 SURGERY — LOOP RECORDER IMPLANT
Anesthesia: LOCAL

## 2013-04-16 MED ORDER — MIDAZOLAM HCL 10 MG/2ML IJ SOLN
INTRAMUSCULAR | Status: DC | PRN
  Administered 2013-04-16: 1 mg via INTRAVENOUS
  Administered 2013-04-16 (×2): 2 mg via INTRAVENOUS
  Administered 2013-04-16: 1 mg via INTRAVENOUS

## 2013-04-16 MED ORDER — SODIUM CHLORIDE 0.45 % IV SOLN
INTRAVENOUS | Status: AC
  Administered 2013-04-16: 13:00:00 via INTRAVENOUS

## 2013-04-16 MED ORDER — FENTANYL CITRATE 0.05 MG/ML IJ SOLN
INTRAMUSCULAR | Status: AC
  Filled 2013-04-16: qty 2

## 2013-04-16 MED ORDER — ONDANSETRON HCL 4 MG/2ML IJ SOLN: 4.0000 mg | Freq: Four times a day (QID) | INTRAMUSCULAR | Status: DC | PRN

## 2013-04-16 MED ORDER — HEPARIN SODIUM (PORCINE) 5000 UNIT/ML IJ SOLN
5000.0000 [IU] | Freq: Three times a day (TID) | INTRAMUSCULAR | Status: DC
  Administered 2013-04-16 – 2013-04-17 (×3): 5000 [IU] via SUBCUTANEOUS
  Filled 2013-04-16 (×6): qty 1

## 2013-04-16 MED ORDER — MIDAZOLAM HCL 5 MG/ML IJ SOLN
INTRAMUSCULAR | Status: AC
  Filled 2013-04-16: qty 2

## 2013-04-16 MED ORDER — BUTAMBEN-TETRACAINE-BENZOCAINE 2-2-14 % EX AERO
INHALATION_SPRAY | CUTANEOUS | Status: DC | PRN
  Administered 2013-04-16: 2 via TOPICAL

## 2013-04-16 MED ORDER — LIDOCAINE-EPINEPHRINE 1 %-1:100000 IJ SOLN
INTRAMUSCULAR | Status: AC
  Filled 2013-04-16: qty 1

## 2013-04-16 MED ORDER — HYDRALAZINE HCL 20 MG/ML IJ SOLN
INTRAMUSCULAR | Status: AC
  Filled 2013-04-16: qty 1

## 2013-04-16 MED ORDER — FENTANYL CITRATE 0.05 MG/ML IJ SOLN
INTRAMUSCULAR | Status: DC | PRN
  Administered 2013-04-16 (×2): 25 ug via INTRAVENOUS

## 2013-04-16 MED ORDER — ACETAMINOPHEN 325 MG PO TABS: 325.0000 mg | ORAL_TABLET | ORAL | Status: DC | PRN

## 2013-04-16 NOTE — Progress Notes (Signed)
[facility] PROGRESS NOTE  Kelli [name] [id number] DOB: 1950-04-24 DOA: 04/08/2013 PCP: Manon Hilding, MD  Assessment/Plan: #1Acute left cerebellar stroke/ new acute right PCA territory infarction -History of left carotid endarterectomy  -MRI brain shows no flow in the right PCA, but not associated with infarct in this territory  -LDL 90--patient was on simvastatin 10 mg prior to admission   2 d echocardiogram with no source of emboli. Carotid Dopplers with no significant ICA stenosis. -PT evaluation  -Urine drug screen and alcohol level negative  -Repeat CT brain 04/10/13 due to RN report of worsen LUE/LLE weak is negative for any acute infarct. Patient with left sided weakness. Repeat CT head per neurology is negative. MRI is consistent with a new right PCA infarction. Concern for embolic etiology. Patient will be off trial medication at this time per neurology and patient on ASA 325mg  daily. Neuro following and appreciate input and recommendations. Patient s/pTEE and loop recorder today. Preliminary [name] negative for left atrial thrombus, negative bubble study, EF 65% with no wall motion abnormalities. Extensive grade 4 bulky aortic debris and plaque mostly in the distal thoracic aorta but some near arch. Plaque may be a source of emboli. Neurology is following and appreciate input and recommendations. Hypertension, poorly controlled  -Decreased hydralazine to 75 mg 3 times a day  -continue atenolol to 25 mg twice a day  -Continue clonidine TTS 3  -Continue amlodipine 10 mg daily  Impaired glucose tolerance  -Hemoglobin A1c 6.4  -Lifestyle modification  Prior renal artery duplex negative for renal artery stenosis-  CKD stage III  -Review of the records reveals large variation and baseline serum creatinine  -varies from 1.4-1.9. Followup as outpatient. -Urinalysis is bland  Positive gastro-occult  -Hemoglobin has remained stable  -Continue to monitor  -Patient denies any  abdominal pain, tolerating diet  History of tobacco use  -Patient quit one year ago  Anxiety disorder/fibromyalgia  -Continue Klonopin 0.5 mg twice a day    Code Status: full Family Communication: updated patient and husband at bedside. Disposition Plan: CIR vs SNF   Consultants:  Neurology: Dr. Nicole Kindred 04/08/2013  Cardiology: Dr. Crissie Sickles  Procedures:  CT head 04/10/2013  MRI MRA of the head 04/08/2013  2-D echo 04/10/2013  Carotid Dopplers 04/10/2013  Chest x-ray 04/08/2013, 04/09/2013  MRI head 04/13/2013   [name] 04/16/2013  Implantable loop recorder 04/16/2013  Antibiotics:  none  HPI/Subjective: Patient awake and alert following commands. Patient still with a significant left-sided hemiparesis/weakness. Patient s/p [name] and implantable loop placement.  Objective: Filed Vitals:   04/16/13 1120  BP: 92/61  Pulse: 73  Temp:   Resp: 9   No intake or output data in the 24 hours ending 04/16/13 1153 Filed Weights   04/08/13 1507 04/08/13 2115  Weight: 97.523 kg (215 lb) 98.748 kg (217 lb 11.2 oz)    Exam:   General:  Patient awake.  Cardiovascular: regular rate and rhythm  Respiratory: clear to auscultation bilaterally  Abdomen: soft, nontender, nondistended, positive bowel sounds  Musculoskeletal: no clubbing cyanosis or edema. L sided weakness with 0/5 LUE strength, 2/5 LLE strength. Left visual field defect.  Data Reviewed: Basic Metabolic Panel:  Recent Labs Lab 04/10/13 0940 04/13/13 0545 04/14/13 0528 04/16/13 0627  NA 141 142 146 144  K 4.6 3.3* 3.7 3.8  CL 104 106 109 107  CO2 23 24 24 24   GLUCOSE 186* 144* 127* 126*  BUN 24* 24* 27* 31*  CREATININE 1.45* 1.49* 1.49* 1.57*  CALCIUM 8.6 9.0 9.0 9.1   Liver Function Tests:  Recent Labs Lab 04/10/13 0940  AST 44*  ALT 16  ALKPHOS 65  BILITOT 0.4  PROT 7.1  ALBUMIN 3.3*   No results found for this basename: LIPASE, AMYLASE,  in the last 168 hours No results found  for this basename: AMMONIA,  in the last 168 hours CBC:  Recent Labs Lab 04/10/13 0940 04/13/13 0545  WBC 11.0* 11.2*  HGB 11.4* 12.1  HCT 34.1* 36.9  MCV 85.3 85.6  PLT 276 276   Cardiac Enzymes: No results found for this basename: CKTOTAL, CKMB, CKMBINDEX, TROPONINI,  in the last 168 hours BNP (last 3 results) No results found for this basename: PROBNP,  in the last 8760 hours CBG:  Recent Labs Lab 04/15/13 0644 04/15/13 1153 04/15/13 1637 04/15/13 2102 04/16/13 0639  GLUCAP 153* 136* 117* 99 127*    No results found for this or any previous visit (from the past 240 hour(s)).   Studies: No results found.  Scheduled Meds: . amLODipine  10 mg Oral Daily  . antiseptic oral rinse  15 mL Mouth Rinse BID  . aspirin  325 mg Oral Daily  . atenolol  25 mg Oral BID  . cloNIDine  0.3 mg Transdermal Q Tue  . enoxaparin (LOVENOX) injection  40 mg Subcutaneous QHS  . fesoterodine  8 mg Oral Daily  . hydrALAZINE  75 mg Oral 3 times per day  . insulin aspart  0-5 Units Subcutaneous QHS  . insulin aspart  0-9 Units Subcutaneous TID WC  . polyethylene glycol  17 g Oral BID  . senna-docusate  1 tablet Oral QHS  . simvastatin  10 mg Oral q1800   Continuous Infusions: . sodium chloride 20 mL/hr at 04/16/13 [id number]    Principal Problem:   Acute CVA (cerebrovascular accident): R PCA infarct per MRI 04/13/13 Active Problems:   Acute ischemic stroke   Hypertension, uncontrolled   Occlusion and stenosis of carotid artery with cerebral infarction   Anxiety state, unspecified   CKD (chronic kidney disease) stage 3, GFR 30-59 ml/min   Depression   Diplopia   Left-sided weakness   Vertigo   Paresthesias in left hand   Pre-diabetes   Cerebellar stroke   Dizziness and giddiness   Stroke    Time spent: Hadley MD [facility] Pager [phone]. If 7PM-7AM, please contact night-coverage at [email or web address], password [facility] 04/16/2013, 11:53 AM  LOS: 8 days

## 2013-04-16 NOTE — Progress Notes (Signed)
Rehab admissions - We did receive approval from [facility]. However, pt is on 24 hr bedrest following ILR (implantable loop recorder) placement. Will plan for admission tomorrow on 2-17 after her bedrest is finished. Updated Hassan Rowan case manager as well.  Please call me for any questions. Updated pt's husband by phone.  Thanks,  Nanetta Batty, [facility] Admissions Coordinator [phone]

## 2013-04-16 NOTE — Consult Note (Signed)
ELECTROPHYSIOLOGY CONSULT NOTE   Patient ID: LAWRYN SUN MRN: [id number], DOB/AGE: 03-25-1950   Admit date: 04/08/2013 Date of Consult: 04/16/2013  Primary Physician: Quintin Alto, MD Primary Cardiologist: Domenic Polite, MD Reason for Consultation: Cryptogenic stroke; recommendations regarding Implantable Loop Recorder  History of Present Illness ZALEAH HUETTER is a 63 year old female with HTN, DM, CKD, PVD and prior CVA was admitted on 04/08/2013 with acute CVA. She has been monitored on telemetry which has demonstrated no evidence of atrial fibrillation. No cause has been identified. Inpatient stroke work-up is to be completed with a [name]. EP has been asked to evaluate for placement of an implantable loop recorder to monitor for atrial fibrillation. Of note, Dr. Domenic Polite has been following her for refractory hypertension. Recent Lexiscan Myoview stress test negative for ischemia.  Past Medical History Past Medical History  Diagnosis Date  . Essential hypertension, benign   . Fibromyalgia   . Cerebral infarction Aug 2014    Bihemispheric watershed infarcts  . Mixed hyperlipidemia   . Borderline diabetes   . Urinary incontinence   . Carotid artery occlusion     Occluded RICA, status post left CEA  August 2014 - Dr. Donnetta Hutching  . CKD (chronic kidney disease) stage 3, GFR 30-59 ml/min   . 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 Urethral dilation  1980's  . Combined hysterectomy vaginal w/ mmk / a&p repair  1981  . Vaginal hysterectomy  1981    "partial" (10/04/2012)  . Endarterectomy Left 10/06/2012    Procedure: Carotid Endarterectomy with Finesse patch angioplasty;  Surgeon: [name], MD;  Location: [facility];  Service: Vascular;  Laterality: Left;    Allergies/Intolerances Allergies  Allergen Reactions  . Hydrochlorothiazide Nausea And Vomiting    Inpatient  Medications . [MAR HOLD] amLODipine  10 mg Oral Daily  . [facility] HOLD] antiseptic oral rinse  15 mL Mouth Rinse BID  . [facility] HOLD] aspirin  325 mg Oral Daily  . [facility] HOLD] atenolol  25 mg Oral BID  . [facility] HOLD] cloNIDine  0.3 mg Transdermal Q Tue  . [MAR HOLD] enoxaparin (LOVENOX) injection  40 mg Subcutaneous QHS  . [facility] HOLD] fesoterodine  8 mg Oral Daily  . [facility] HOLD] hydrALAZINE  75 mg Oral 3 times per day  . [MAR HOLD] insulin aspart  0-5 Units Subcutaneous QHS  . [MAR HOLD] insulin aspart  0-9 Units Subcutaneous TID WC  . [MAR HOLD] polyethylene glycol  17 g Oral BID  . [MAR HOLD] senna-docusate  1 tablet Oral QHS  . [facility] HOLD] simvastatin  10 mg Oral q1800   . sodium chloride 20 mL/hr at 04/13/13 [id number]    Social History History   Social History  . Marital Status: Married    Spouse Name: N/A    Number of Children: N/A  . Years of Education: N/A   Occupational History  . Not on file.   Social History Main Topics  . Smoking status: Former Smoker -- 1.00 packs/day for 44 years    Types: Cigarettes    Quit date: 10/03/2012  . Smokeless tobacco: Never Used  . Alcohol Use: No  . Drug Use: No  . Sexual Activity: Yes    Birth Control/ Protection: Surgical   Other Topics Concern  . Not on file   Social History Narrative  . No narrative  on file    Review of Systems General: No chills, fever, night sweats or weight changes  Cardiovascular:  No chest pain, dyspnea on exertion, edema, orthopnea, palpitations, paroxysmal nocturnal dyspnea Dermatological: No rash, lesions or masses Respiratory: No cough, dyspnea Urologic: No hematuria, dysuria Abdominal: No nausea, vomiting, diarrhea, bright red blood per rectum, melena, or hematemesis Neurologic: No visual changes, weakness, changes in mental status All other systems reviewed and are otherwise negative except as noted above.  Physical Exam Blood pressure 256/84, pulse 59, temperature 98.1 F (36.7 C), temperature source  Oral, resp. rate 13, height 5\' 5"  (1.651 m), weight 217 lb 11.2 oz (98.748 kg), SpO2 98.00%.  General: Well developed, well appearing 63 y.o. female in no acute distress. HEENT: Normocephalic, atraumatic. Sclera nonicteric. Oropharynx clear.  Neck: Supple. No JVD. Lungs: Respirations regular and unlabored, CTA bilaterally. No wheezes, rales or rhonchi. Heart: RRR. S1, S2 present. No murmurs, rub, S3 or S4. Abdomen: Soft, non-distended.  Extremities: No clubbing, cyanosis or edema.  Psych: Normal affect. Neuro: Alert and oriented X 3. Moves all extremities spontaneously. Musculoskeletal: No kyphosis. Skin: Intact. Warm and dry. No rashes or petechiae in exposed areas.   Labs Lab Results  Component Value Date   WBC 11.2* 04/13/2013   HGB 12.1 04/13/2013   HCT 36.9 04/13/2013   MCV 85.6 04/13/2013   PLT 276 04/13/2013    Recent Labs Lab 04/10/13 0940  04/16/13 0627  NA 141  < > 144  K 4.6  < > 3.8  CL 104  < > 107  CO2 23  < > 24  BUN 24*  < > 31*  CREATININE 1.45*  < > 1.57*  CALCIUM 8.6  < > 9.1  PROT 7.1  --   --   BILITOT 0.4  --   --   ALKPHOS 65  --   --   ALT 16  --   --   AST 44*  --   --   GLUCOSE 186*  < > 126*  < > = values in this interval not displayed.   Radiology/Studies Ct Head Wo Contrast  04/12/2013   CLINICAL DATA:  Lethargy and increased left-sided weakness.  EXAM: CT HEAD WITHOUT CONTRAST  TECHNIQUE: Contiguous axial images were obtained from the base of the skull through the vertex without intravenous contrast.  COMPARISON:  04/10/2013 head CT and 04/08/2013 brain MRI  FINDINGS: There is new hypoattenuation within the posteromedial right temporal lobe and right occipital lobe as well as right cerebral peduncle. Focus of hypoattenuation in the left cerebellum corresponds to acute infarct described on recent MRI. There is no evidence of intracranial hemorrhage. Patchy areas of hypoattenuation within the white matter of both cerebral hemispheres are compatible  with moderate chronic small vessel ischemic disease and similar to the prior study. There is no evidence of mass or midline shift. No extra-axial fluid collection.  Mastoid air cells are clear. There is mild posterior left ethmoid air cell mucosal thickening. Prior bilateral cataract surgery is noted.  IMPRESSION: Findings concerning for acute right PCA infarct.  These results were called by telephone at the time of interpretation on 04/12/2013 at 12:53 PM to Dr. Collier Salina, Janann Colonel , who verbally acknowledged these results.   Electronically Signed   By: Logan Bores   On: 04/12/2013 12:53   Dg Chest Port 1 View  04/08/2013   CLINICAL DATA:  Stroke.  Productive cough.  EXAM: PORTABLE CHEST - 1 VIEW  COMPARISON:  DG CHEST 2  VIEW dated 01/16/2013; DG CHEST 2 VIEW dated 10/01/2012  FINDINGS: Cardiopericardial silhouette within normal limits. Mediastinal contours normal. Trachea midline. No airspace disease or effusion. Basilar atelectasis is present, prominent on the left but similar to prior. Monitoring leads project over the chest.  IMPRESSION: No acute cardiopulmonary disease.   Electronically Signed   By: Dereck Ligas M.D.   On: 04/08/2013 23:58   Mr Brain Ltd W/o Cm  04/13/2013   CLINICAL DATA:  Evaluate for new infarct.  EXAM: MRI HEAD WITHOUT CONTRAST  TECHNIQUE: Multi planar diffusion imaging of the brain and surrounding structures were obtained without intravenous contrast. Limited exam requested for clinical surface.  COMPARISON:  04/09/2011  FINDINGS: There is restricted diffusion in the entirety of the right PCA territory, including right occipital lobe, mesial and posterior right temporal lobe, mid brain and posterior right thalamus. There is no gross hemorrhage within the new infarct. In early subacute infarct in the central left cerebellum is again seen, slightly fuller in size, now 9 mm. No acute anterior circulation infarct is seen. There is remote bilateral border zone infarcts in this patient with  chronic right ICA occlusion.  These results were called by telephone at the time of interpretation on 04/13/2013 at 12:59 PM to Dr. Mikey Bussing , who verbally acknowledged these results.  IMPRESSION: 1. New, acute right PCA territory infarction. 2. Pre-existing left cerebellar infarct is mildly larger.   Electronically Signed   By: Jorje Guild M.D.   On: 04/13/2013 13:02    Echocardiogram  Study Conclusions - Left ventricle: The cavity size was normal. There was moderate concentric hypertrophy. Systolic function was normal. The estimated ejection fraction was in the range of 55% to 60%. Wall motion was normal; there were no regional wall motion abnormalities. - Mitral valve: Calcified annulus. Mildly thickened leaflets. - Pericardium, extracardiac: A trivial pericardial effusion was identified posterior to the heart.  12-lead ECG on admission - NSR at 95 bpm Telemetry reviewed - SR; brief, nonsustained atrial tachycardia lasting <30 seconds; no evidence of atrial fibrillation  Assessment and Plan 1. Cryptogenic stroke 2. HTN 3. CKD 4. PVD 5. Tobacco abuse  If the [name] is negative, we recommend loop recorder insertion to monitor for AF. The indication for loop recorder insertion / monitoring for AF in setting of cryptogenic stroke was discussed with the patient. The loop recorder insertion procedure was reviewed in detail including risks and benefits. These risks include but are not limited to bleeding and infection. The patient expressed verbal understanding and agrees to proceed. These recommendations were also discussed in detail with the patient's husband and sister who assisted with consent. Dr. Lovena Le to see.  Signed, Ileene Hutchinson 04/16/2013, 9:10 AM    EP Attending  Patient seen and examined. Agree with above history, exam, assessment and plan. Will plan ILR insertion if [name] demonstrates no cause for cryptogenic stroke.   Mikle Bosworth.D.

## 2013-04-16 NOTE — PMR Pre-admission (Signed)
PMR Admission Coordinator Pre-Admission Assessment  Patient: Kelli [name] is an 63 y.o., female MRN: [id number] DOB: Nov 15, 1950 Height: 5\' 5"  (165.1 cm) Weight: 98.748 kg (217 lb 11.2 oz)              Insurance Information HMO:     PPO:      PCP:      IPA:      80/20:      OTHER:  PRIMARY: [employer]      Policy#: [id number]      Subscriber: spouse CM Name: Archie Patten      Phone#: [phone]     Fax#:  Pre-Cert#: [id number]      Employer: husband works full time in Architect Note: Follow up will be with onsite reviewed [employer].  Benefits:  Phone #: [phone]     Name: Johnney Ou. Date: 06-29-12     Deduct: none      Out of Pocket Max: $2500      Life Max: none CIR: 100% + $500 copay (60 day visit limit)      SNF: 100% (no copay, 60 day visit max) Outpatient: 100%     Co-Pay: $15 copay per visit (20 visits each for PT/OT/SLP)  Home Health: 100%      Co-Pay: none, 60 visits/year (1 vist=4 hrs of skilled care) DME: 100%      Co-Pay: none Providers: in network  Emergency Contact Information Contact Information   Name Relation Home Work Mobile   Minniefield,Curtis Spouse [phone]  [phone]   Burggraf,Corbett Son [phone]       Current Medical History  Patient Admitting Diagnosis: left cerebellar infarct, ?acute right cortical infarct new R PCA infarct found on MRI  History of Present Illness: Kelli [name] is a 63 y.o. 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 Presented 04/08/2013 left-sided numbness and weakness. MRI of the brain showed acute infarct in the left cerebellum. MRA of the head with chronic right ICA artery occlusion and widely patent dominant right vertebral artery. Echocardiogram with ejection fraction of 60% no wall motion  abnormalities. Carotid Dopplers showed left CEA patent and right vertebral artery flow is antegrade. Patient did not receive TPA. Neurology services consulted maintained on socrates trial,ticagrelor versus aspirin for stroke prevention. Subcutaneous Lovenox for DVT prophylaxis. Dysphagia 1 nectar thick liquid diet. Physical and occupational therapy evaluations completed with recommendations for physical medicine rehabilitation consult.  NIH Total: 10  Past Medical History  Past Medical History  Diagnosis Date  . Essential hypertension, benign   . Fibromyalgia   . Cerebral infarction Aug 2014    Bihemispheric watershed infarcts  . Mixed hyperlipidemia   . Borderline diabetes   . Urinary incontinence   . Carotid artery occlusion     Occluded RICA, status post left CEA  August 2014 - Dr. Donnetta Hutching  . CKD (chronic kidney disease) stage 3, GFR 30-59 ml/min   . 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  Prior Rehab/Hospitalizations: had previous CIR after CVA in August 2014, then followed  with outpatient PT.    Current Medications  Current facility-administered medications:acetaminophen (TYLENOL) tablet 325-650 mg, 325-650 mg, Oral, Q4H PRN, Evans Lance, MD;  amLODipine (NORVASC) tablet 10 mg, 10 mg, Oral, Daily, Bynum Bellows, MD, 10 mg at 04/17/13 1119;  antiseptic oral rinse (BIOTENE) solution 15 mL, 15 mL, Mouth Rinse, BID, Eugenie Filler, MD, 15 mL at 04/17/13 [id number];  aspirin EC tablet 81 mg, 81 mg, Oral, Daily, Donzetta Starch, NP atenolol (TENORMIN) tablet 25 mg, 25 mg, Oral, BID, Orson Eva, MD, 25 mg at 04/17/13 1119;  clonazePAM (KLONOPIN)  tablet 0.5 mg, 0.5 mg, Oral, BID PRN, Bynum Bellows, MD, 0.5 mg at 04/15/13 1105;  cloNIDine (CATAPRES - Dosed in mg/24 hr) patch 0.3 mg, 0.3 mg, Transdermal, Q Tue, Bynum Bellows, MD, 0.3 mg at 04/10/13 1101;  clopidogrel (PLAVIX) tablet 75 mg, 75 mg, Oral, Q breakfast, Donzetta Starch, NP fesoterodine (TOVIAZ) tablet 8 mg, 8 mg, Oral, Daily, Bynum Bellows, MD, 8 mg at 04/15/13 1106;  heparin injection 5,000 Units, 5,000 Units, Subcutaneous, 3 times per day, Evans Lance, MD, 5,000 Units at 04/17/13 [phone];  hydrALAZINE (APRESOLINE) injection 10 mg, 10 mg, Intravenous, Q6H PRN, Bynum Bellows, MD, 10 mg at 04/16/13 [id number] hydrALAZINE (APRESOLINE) tablet 75 mg, 75 mg, Oral, 3 times per day, Eugenie Filler, MD, 75 mg at 04/17/13 [id number];  insulin aspart (novoLOG) injection 0-5 Units, 0-5 Units, Subcutaneous, QHS, Bynum Bellows, MD;  insulin aspart (novoLOG) injection 0-9 Units, 0-9 Units, Subcutaneous, TID WC, Bynum Bellows, MD, 1 Units at 04/17/13 1252;  magnesium citrate solution 1 Bottle, 1 Bottle, Oral, Daily PRN, Eugenie Filler, MD ondansetron [facility]) injection 4 mg, 4 mg, Intravenous, Q6H PRN, Evans Lance, MD;  polyethylene glycol (MIRALAX / GLYCOLAX) packet 17 g, 17 g, Oral, BID, Eugenie Filler, MD, 17 g at 04/17/13 1127;  prochlorperazine (COMPAZINE) injection 10 mg, 10 mg, Intravenous, Q6H PRN, Barton Dubois, MD;  [place], , Oral, PRN, Orson Eva, MD senna-docusate (Senokot-S) tablet 1 tablet, 1 tablet, Oral, QHS, Eugenie Filler, MD, 1 tablet at 04/16/13 2220;  simvastatin (ZOCOR) tablet 10 mg, 10 mg, Oral, q1800, Bynum Bellows, MD, 10 mg at 04/15/13 2100;  sodium chloride (OCEAN) 0.65 % nasal spray 1 spray, 1 spray, Each Nare, PRN, Gardiner Barefoot, NP, 1 spray at 04/12/13 0048  Patients Current Diet: Dys Level 3  Precautions / Restrictions Precautions Precautions: Fall Precaution Comments: Lt field deficit and Lt inattention Restrictions Weight Bearing  Restrictions: Yes   Prior Activity Level Limited Community (1-2x/wk): pt did go out on errands with husband on the weekends. She was not driving.   Home Assistive Devices / Equipment Home Assistive Devices/Equipment: None Home Equipment: Shower seat  Prior Functional Level Prior Function Level of Independence: Independent Comments: Engineer, manufacturing systems, gardening, art, nature.  Pt reports she does not drive  Current Functional Level Cognition  Arousal/Alertness: Lethargic Overall Cognitive Status: Impaired/Different from baseline Current Attention Level: Sustained Orientation Level: Oriented to person;Oriented to situation;Disoriented to place;Disoriented to time Following Commands: Follows one step commands consistently General Comments: Better attention and stimulation to L side today. patient able to rub left arm with washcloth and turn head ~ 60 to left Attention: Sustained Sustained Attention: Impaired Sustained Attention Impairment: Verbal basic;Functional basic Memory: Impaired Memory Impairment: Decreased recall of new information Awareness: Impaired Awareness Impairment: Intellectual impairment Problem Solving: Impaired Problem Solving Impairment: Verbal basic Safety/Judgment: Impaired    Extremity  Assessment (includes Sensation/Coordination)          ADLs  Eating/Feeding: Other (comment) (not attempted due to decreased arrousal) Grooming: Brushing hair;Moderate assistance Where Assessed - Grooming: Unsupported sitting Upper Body Bathing: Maximal assistance Where Assessed - Upper Body Bathing: Supine, head of bed up Lower Body Bathing: +1 Total assistance Where Assessed - Lower Body Bathing: Supine, head of bed up;Rolling right and/or left Upper Body Dressing: +1 Total assistance Where Assessed - Upper Body Dressing: Supine, head of bed up Lower Body Dressing: +1 Total assistance Where Assessed - Lower Body Dressing: Supine, head of bed up;Rolling right and/or  left Toilet Transfer: +1 Total assistance (did not attempt due to lethargy) Toileting - Clothing Manipulation and Hygiene: +1 Total assistance Where Assessed - Toileting Clothing Manipulation and Hygiene: Supine, head of bed flat;Rolling right and/or left ADL Comments: Pt moved to EOB.  Worked on trunk control and sitting balance.  Pt maintains head rotated to Rt.  - will turn to Lt with cues.  She was unable to locate clock on wall to her left with maximal cues, and unable to locate objects to left of midline.  She was unable to read numbers on dynamometer on her Rt. Pt unable to participate in formal visual testing as she is unable to maintain eye opening long enough to do so.  She scooted up EOB with max A    Mobility  Overal bed mobility: Needs Assistance Bed Mobility: Supine to Sit;Sit to Supine Rolling: Max assist Sidelying to sit: Max assist;+2 for physical assistance Supine to sit: Mod assist;HOB elevated Sit to supine: Mod assist;+2 for physical assistance General bed mobility comments: pt A by using her R UE, but needs cueing for step-by-step through mobility and attending to task.      Transfers  Overall transfer level: Needs assistance Equipment used: 2 person hand held assist Transfers: Sit to/from Stand Sit to Stand: Mod assist;+2 physical assistance Squat pivot transfers: Max assist;+2 physical assistance General transfer comment: pt needs cueing and facilitation for anterior wt shift with coming to stand, blocking L knee and trunk/hip extension coming to stand.  Able to stand x3 this session with better initiation each stand    Ambulation / Gait / Stairs / Wheelchair Mobility       Posture / Balance Dynamic Sitting Balance Sitting balance - Comments: required minA - Minguard for EOB balance - variable status    Special needs/care consideration BiPAP/CPAP no CPM no Continuous Drip IV no  Dialysis no         Life Vest no Oxygen no Special Bed no Trach Size no  Wound  Vac (area) no       Skin no issues                               Bowel mgmt: last BM on 04-14-13 Bladder mgmt: urinary incontinence, using bed pan Diabetic mgmt no Special considerations: pt had implantable loop recorder placed on 2-16 and was on 24 hr bedrest. Bedrest ended at 2-17 at 11:00am.   Per husband: pt with possible L eye visual issues and neglect   Previous Home Environment Living Arrangements: Spouse/significant other;Other relatives  Lives With: Spouse Available Help at Discharge: Family Type of Home: House Home Layout: Two level;Able to live on main level with bedroom/bathroom Alternate Level Stairs-Rails: Right Alternate Level Stairs-Number of Steps: flight Home Access: Stairs to enter Entrance Stairs-Rails: None Entrance Stairs-Number of Steps:  3 Bathroom Shower/Tub: Chiropodist: Standard Home Care Services: No  Discharge Living Setting Plans for Discharge Living Setting: [name] home Type of Home at Discharge: House Discharge Home Layout: Two level;Able to live on main level with bedroom/bathroom Alternate Level Stairs-Rails:  (pt does not go upstairs per husband) Discharge Home Access: Stairs to enter (husband works in Architect & plans to put in Central) Technical brewer of Steps: 3 Does the patient have any problems obtaining your medications?: No  Social/Family/Support Systems Patient Roles: Spouse Contact Information: husband Averyl Zeringue and son Anticipated Caregiver: youngest son and potentially hired caregivers/friends while husband works Equities trader Information: see above Ability/Limitations of Caregiver: no limitations, per husband "we will arrange 24 hr care, whatever it takes" Caregiver Availability: 24/7 Discharge Plan Discussed with Primary Caregiver: Yes Is Caregiver In Agreement with Plan?: Yes Does Caregiver/Family have Issues with Lodging/Transportation while Pt is in Rehab?:  No  Goals/Additional Needs Patient/Family Goal for Rehab: sup to minA w/ PT/OT and sup. w/SLP Expected length of stay: 20-25 days Cultural Considerations: none Dietary Needs: Dys Level 3 Equipment Needs: to be determined Pt/Family Agrees to Admission and willing to participate: Yes (spoke directly with pt's husband and son) Program Orientation Provided & Reviewed with Pt/Caregiver Including Roles  & Responsibilities: Yes (pt was previously at [facility] last Aug. 2014, family/pt familiar)   Decrease burden of Care through IP rehab admission: NA   Possible need for SNF placement upon discharge: not anticipated   Patient Condition: This patient's medical and functional status has changed since the consult dated: 04-12-13 in which the Rehabilitation Physician determined and documented that the patient's condition is appropriate for intensive rehabilitative care in an inpatient rehabilitation facility. See "History of Present Illness" (above) for medical update. Functional changes are: mod to max assist x 2 for transfers. [name] medical and functional status update has been discussed with the Rehabilitation physician and patient remains appropriate for inpatient rehabilitation. Will admit to inpatient rehab today.  Preadmission Screen Completed By:  Nanetta Batty, PT 04/17/2013 12:56 PM ______________________________________________________________________   Discussed status with Dr. Naaman Plummer on 04-17-13 at  and received telephone approval for admission today.  Admission Coordinator: Sherlyn Hay Cotey Rakes,PT time 1256/Date 04-17-13

## 2013-04-16 NOTE — H&P (Addendum)
Physical Medicine and Rehabilitation Admission H&P    Chief Complaint  Patient presents with  . Facial numbness, left sided weakness, right gaze preference, N/V   HPI:  Kelli [name] is a 63 y.o. 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  She was admitted on 04/08/2013 with dizziness, N/V as well as left-sided numbness and weakness. MRI of the brain showed acute infarct in the left cerebellum. MRA of the head with chronic right ICA artery occlusion and widely patent dominant right vertebral artery. Echocardiogram with ejection fraction of 60% no wall motion abnormalities.  Carotid Dopplers showed left CEA patent and right vertebral artery flow is antegrade. Patient did not receive TPA. Neurology services consulted and recommended ASA for thrombotic stroke due to SVD--hold off on trial. maintained on socrates trial,ticagrelor versus aspirin for stroke prevention. Patient with severe HA as well as waxing and waning of left sided weakness with lability.  MRI brain repeated 04/13/13 with new restricted diffusion entire R-PCA involving right occipital lobe, mesial and posterior right temporal lobe, mid brain and posterior right thalamus and slight increase in left cerebellar stroke. 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 Implantable loop recorder placed by Dr. Lovena Le yesterday. Patient with resultant dense Left hemiparesis, left inattention, left HH and dysphagia,     Review of Systems  HENT: Negative for hearing loss.   Eyes: Negative for blurred vision and double vision.  Respiratory: Negative for cough and shortness of breath.   Cardiovascular: Negative for chest pain and palpitations.    Gastrointestinal: Negative for heartburn, nausea and vomiting.  Genitourinary: Positive for urgency and frequency.  Musculoskeletal: Positive for joint pain (left shoulder pain) and myalgias.  Neurological: Positive for sensory change. Negative for dizziness and headaches.  Psychiatric/Behavioral: Positive for memory loss. The patient is nervous/anxious.     Past Medical History  Diagnosis Date  . Essential hypertension, benign   . Fibromyalgia   . Cerebral infarction Aug 2014    Bihemispheric watershed infarcts  . Mixed hyperlipidemia   . Borderline diabetes   . Urinary incontinence   . Carotid artery occlusion     Occluded RICA, status post left CEA  August 2014 - Dr. Donnetta Hutching  . CKD (chronic kidney disease) stage 3, GFR 30-59 ml/min   . 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 Urethral dilation  1980's  . Combined hysterectomy vaginal w/ mmk / a&p repair  1981  . Vaginal hysterectomy  1981    "partial" (10/04/2012)  . Endarterectomy Left 10/06/2012    Procedure: Carotid Endarterectomy with Finesse patch angioplasty;  Surgeon: [name], MD;  Location: [facility];  Service: Vascular;  Laterality: Left;    Family History  Problem Relation Age of Onset  . Diabetes Mother   . Diabetes Father   . Hypertension Sister   . Diabetes Brother   . Seizures Son   . Kidney disease Maternal Grandmother   . Hypertension Maternal Grandmother   . Heart disease Maternal Grandfather   . Diabetes Paternal Grandmother   . Heart disease Paternal Grandfather    Social History: Married. 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 Per reports that she quit smoking about 6 months ago. Her smoking use included Cigarettes. She has a 44 pack-year smoking history. She has never used smokeless tobacco. She reports that she does not drink alcohol or use illicit  drugs.   Allergies  Allergen Reactions  . Hydrochlorothiazide Nausea And Vomiting   Medications Prior to Admission  Medication Sig Dispense Refill  . amLODipine (NORVASC) 10 MG tablet Take 10 mg by mouth daily.      Marland Kitchen aspirin EC 81 MG tablet Take 81 mg by mouth every morning.      Marland Kitchen atenolol (TENORMIN) 25 MG tablet Take 1 tablet (25 mg total) by mouth daily.  180 tablet  3  . clonazePAM (KLONOPIN) 0.5 MG tablet Take 0.5 mg by mouth 2 (two) times daily as needed for anxiety.      . cloNIDine (CATAPRES - DOSED IN MG/24 HR) 0.3 mg/24hr patch Place 0.3 mg onto the skin once a week. On Tuesday      . hydrALAZINE (APRESOLINE) 50 MG tablet Take 1 tablet (50 mg total) by mouth 2 (two) times daily before a meal.  270 tablet  3  . simvastatin (ZOCOR) 10 MG tablet Take 1 tablet (10 mg total) by mouth daily at 6 PM.  30 tablet  1  . tolterodine (DETROL LA) 4 MG 24 hr capsule Take 1 capsule (4 mg total) by mouth daily.  30 capsule  3    Home: Home Living Family/patient expects to be discharged to:: Private residence Living Arrangements: Spouse/significant other;Other relatives Available Help at Discharge: Family Type of Home: House Home Access: Stairs to enter [employer] of Steps: 3 Entrance Stairs-Rails: None Home Layout: Two level;Able to live on main level with bedroom/bathroom Alternate Level Stairs-Number of Steps: flight Alternate Level Stairs-Rails: Right Home Equipment: Shower seat  Lives With: Spouse   Functional History: Prior Function Comments: Engineer, manufacturing systems, gardening, art, nature.  Pt reports she does not drive  Functional Status:  Mobility:          ADL: ADL Eating/Feeding: Other (comment) (not attempted due to decreased arrousal) Grooming: Brushing hair;Moderate assistance Where Assessed - Grooming: Unsupported sitting Upper Body Bathing: Maximal assistance Where Assessed - Upper Body Bathing: Supine, head of bed up Lower Body Bathing: +1 Total  assistance Where Assessed - Lower Body Bathing: Supine, head of bed up;Rolling right and/or left Upper Body Dressing: +1 Total assistance Where Assessed - Upper Body Dressing: Supine, head of bed up Lower Body Dressing: +1 Total assistance Where Assessed - Lower Body Dressing: Supine, head of bed up;Rolling right and/or left Toilet Transfer: +1 Total assistance (did not attempt due to lethargy) ADL Comments: Pt moved to EOB.  Worked on trunk control and sitting balance.  Pt maintains head rotated to Rt.  - will turn to Lt with cues.  She was unable to locate clock on wall to her left with maximal cues, and unable to locate objects to left of midline.  She was unable to read numbers on dynamometer on her Rt. Pt unable to participate in formal visual testing as she is unable to maintain eye opening long enough to do so.  She scooted up EOB with max A  Cognition: Cognition Overall Cognitive Status: Impaired/Different from baseline Arousal/Alertness: Lethargic Orientation Level: Oriented X4 Attention: Sustained Sustained Attention: Impaired Sustained Attention Impairment: Verbal basic;Functional basic Memory: Impaired Memory Impairment: Decreased recall of new information Awareness: Impaired Awareness Impairment: Intellectual impairment  Problem Solving: Impaired Problem Solving Impairment: Verbal basic Safety/Judgment: Impaired Cognition Arousal/Alertness: Lethargic Behavior During Therapy: Flat affect Overall Cognitive Status: Impaired/Different from baseline Area of Impairment: Attention;Following commands;Problem solving Current Attention Level: Sustained Following Commands: Follows one step commands consistently Problem Solving: Slow processing;Decreased initiation;Difficulty sequencing;Requires verbal cues;Requires tactile cues General Comments: Better attention and stimulation to L side today. patient able to rub left arm with washcloth and turn head ~ 60 to left  Physical  Exam: Blood pressure 182/63, pulse 79, temperature 98.6 F (37 C), temperature source Oral, resp. rate 20, height 5' 5"  (1.651 m), weight 98.748 kg (217 lb 11.2 oz), SpO2 96.00%. Physical Exam  Neurological: She displays abnormal reflex.   Vitals reviewed.  HENT: keeps eyes closed unless cued. restless Head: Normocephalic.  Eyes: EOM are normal.  Neck: Normal range of motion. Neck supple. No thyromegaly present.  Cardiovascular: Normal rate and regular rhythm. No murmur Respiratory: Effort normal and breath sounds normal. No respiratory distress.  GI: Soft. Bowel sounds are normal. She exhibits no distension. Non-tender Neurological:  Patient was awake. She kept her eyes closed during exam. She was able to name place as well as her age. Follows simple commands. Left HH and   inattention also. 0/5 LUE and LLE on exam. (may have trace HE) Does sense pain to pinch in left arm and leg. Left facial droop and tongue deviation also noted. Speech dysarthric. DTR's 1+ throughout left side. Skin: Skin is warm and dry.  Psychiatric:  A little lethargic, somewhat anxious per baseline otherwise. Very cooperative.     Results for orders placed during the hospital encounter of 04/08/13 (from the past 48 hour(s))  GLUCOSE, CAPILLARY     Status: Abnormal   Collection Time    04/14/13  4:56 PM      Result Value Ref Range   Glucose-Capillary 130 (*) 70 - 99 mg/dL   Comment 1 Documented in Chart    GLUCOSE, CAPILLARY     Status: Abnormal   Collection Time    04/14/13  9:15 PM      Result Value Ref Range   Glucose-Capillary 111 (*) 70 - 99 mg/dL   Comment 1 Documented in Chart     Comment 2 Notify RN    GLUCOSE, CAPILLARY     Status: Abnormal   Collection Time    04/15/13  6:44 AM      Result Value Ref Range   Glucose-Capillary 153 (*) 70 - 99 mg/dL   Comment 1 Documented in Chart     Comment 2 Notify RN    GLUCOSE, CAPILLARY     Status: Abnormal   Collection Time    04/15/13 11:53 AM       Result Value Ref Range   Glucose-Capillary 136 (*) 70 - 99 mg/dL   Comment 1 Documented in Chart    GLUCOSE, CAPILLARY     Status: Abnormal   Collection Time    04/15/13  4:37 PM      Result Value Ref Range   Glucose-Capillary 117 (*) 70 - 99 mg/dL   Comment 1 Documented in Chart    GLUCOSE, CAPILLARY     Status: None   Collection Time    04/15/13  9:02 PM      Result Value Ref Range   Glucose-Capillary 99  70 - 99 mg/dL  BASIC METABOLIC PANEL     Status: Abnormal   Collection Time    04/16/13  6:27 AM  Result Value Ref Range   Sodium 144  137 - 147 mEq/L   Potassium 3.8  3.7 - 5.3 mEq/L   Chloride 107  96 - 112 mEq/L   CO2 24  19 - 32 mEq/L   Glucose, Bld 126 (*) 70 - 99 mg/dL   BUN 31 (*) 6 - 23 mg/dL   Creatinine, Ser 1.57 (*) 0.50 - 1.10 mg/dL   Calcium 9.1  8.4 - 10.5 mg/dL   GFR calc non Af Amer 34 (*) >90 mL/min   GFR calc Af Amer 40 (*) >90 mL/min   Comment: (NOTE)     The eGFR has been calculated using the CKD EPI equation.     This calculation has not been validated in all clinical situations.     eGFR's persistently <90 mL/min signify possible Chronic Kidney     Disease.  GLUCOSE, CAPILLARY     Status: Abnormal   Collection Time    04/16/13  6:39 AM      Result Value Ref Range   Glucose-Capillary 127 (*) 70 - 99 mg/dL   Comment 1 Documented in Chart     Comment 2 Notify RN    GLUCOSE, CAPILLARY     Status: Abnormal   Collection Time    04/16/13 11:56 AM      Result Value Ref Range   Glucose-Capillary 151 (*) 70 - 99 mg/dL   No results found.  Post Admission Physician Evaluation: 1. Functional deficits secondary  to cerebellar and right PCA infarct (involving right occipital lobe and right temporal lobe, mid brain, right thalamus---potentially embolic 2. Patient is admitted to receive collaborative, interdisciplinary care between the physiatrist, rehab nursing staff, and therapy team. 3. [name] level of medical complexity and substantial therapy  needs in context of that medical necessity cannot be provided at a lesser intensity of care such as a SNF. 4. Patient has experienced substantial functional loss from his/her baseline which was documented above under the "Functional History" and "Functional Status" headings.  Judging by the patient's diagnosis, physical exam, and functional history, the patient has potential for functional progress which will result in measurable gains while on inpatient rehab.  These gains will be of substantial and practical use upon discharge  in facilitating mobility and self-care at the household level. 5. Physiatrist will provide 24 hour management of medical needs as well as oversight of the therapy plan/treatment and provide guidance as appropriate regarding the interaction of the two. 6. 24 hour rehab nursing will assist with bladder management, bowel management, safety, skin/wound care, disease management, medication administration, pain management and patient education  and help integrate therapy concepts, techniques,education, etc. 7. PT will assess and treat for/with: Lower extremity strength, range of motion, stamina, balance, functional mobility, safety, adaptive techniques and equipment, NMR, visual perceptual and cognitive perceptual awareness.   Goals are: min to mod assist. 8. OT will assess and treat for/with: ADL's, functional mobility, safety, upper extremity strength, adaptive techniques and equipment, NMR, vis perceptual and cog perceptual rx, ego support.   Goals are: min to mod assist. 9. SLP will assess and treat for/with: swallowing, cognition, communication.  Goals are: supervision to min assist. 10. Case Management and Social Worker will assess and treat for psychological issues and discharge planning. 11. Team conference will be held weekly to assess progress toward goals and to determine barriers to discharge. 12. Patient will receive at least 3 hours of therapy per day at least 5 days per  week. 13. ELOS: 20-28  days       14. Prognosis:  good   Medical Problem List and Plan: left cerebellar infarct with extension and new R-PCA infarct affecting right occipital lobe, mesial and posterior right temporal lobe, mid brain and posterior right thalamus, potentially embolic  -currently on ECASA and plavix for seconary stroke prevention with potential embolic etiology related to aortic plaque  1. DVT Prophylaxis/Anticoagulation: SCD's, sq heparin. Needs lower ext dopplers upon admit 2. Fibromyalgia/Lleft shoulder pain due to subluxation/ Pain Management:  Positioning as patient with LUE neglect. Will add voltaren gel and tylenol prn.  3. Anxiety disorder/ Mood:  Poor awareness with apathy noted. Will likely require activating medication. Await evaluations. LCSW to follow for support and evaluation.  4. Neuropsych: This patient is not capable of making decisions on her own behalf. 5. HTN: will monitor with bid checks. Continue catapress, atenolol, norvasc and hydralazine.  6.  Borderline diabetes: Hgb A1c-6.3/MFG-134. CM diet restrictions. SSI for elevated BS---most thickened liquids are sweetened.  7:  Dyslipidemia: Continue zocor 8.  CKD stage 3:  Will monitor with serial checks.  9. Dysphagia: continue D2,nectar liquids with aspiration precautions. IVF at bedtime to maintain adequate hydration.  Patient/family non-compliant--continue to educate on aspiration risk.   10. Constipation: Continue miralax and senna.  11. Hyperactive bladder: check PVRs, check UA/UCS.   Meredith Staggers, MD, [facility]    04/16/2013

## 2013-04-16 NOTE — Progress Notes (Signed)
Cancellation   04/16/13 1022  OT Visit Information  Last OT Received On 04/16/13  Reason Eval/Treat Not Completed Patient at procedure or test/ unavailable  Roseanne Reno, OTR/L [phone]

## 2013-04-16 NOTE — Progress Notes (Signed)
Stroke Team Progress Note  HISTORY Kelli [name] is a 63 y.o. female with a history of hypertension, hyperlipidemia, watershed cerebral infarctions, right ICA occlusion and left CEA, presenting 04/08/2013 with new onset vertigo with nausea and vomiting as well as numbness involving left side of face and left upper extremity and left upper extremity weakness. Onset of symptoms was at 2:30 PM  04/08/2013. Patient had been taking aspirin 81 mg per day. She presented to the emergency room at [facility] initially. She was initially deemed a candidate for TPA. However, she became worse with increasing nausea and vomiting as well as vertigo and worsening of numbness and left-sided weakness. Speech also became somewhat slurred. CTA was recommended by Specialist on-Call neurologist. Study could not be done however because of patient's elevated creatinine. She was subsequently transferred to [facility] for MRI and MRA study to rule out basal artery thrombosis. MRI showed findings consistent with acute small left cerebellar ischemic infarction. There was no evidence of basilar artery occlusion or significant stenosis. No evidence of thrombus was seen. NIH stroke score on arrival to [facility] was 5. Patient had dark colored emesis which was guaiac positive. Patient was not administerd TPA.  SUBJECTIVE Patient just back from [name] and loop recorder placement. Drowsy from drugs but awakens easily.  OBJECTIVE Most recent Vital Signs: Filed Vitals:   04/16/13 1110 04/16/13 1120 04/16/13 1158 04/16/13 1354  BP: 113/54 92/61 182/63 104/60  Pulse: 74 73 79 79  Temp:   98.6 F (37 C) 98 F (36.7 C)  TempSrc:   Oral Oral  Resp: 15 9 20 20   Height:      Weight:      SpO2: 98% 99% 96% 97%   CBG (last 3)   Recent Labs  04/15/13 2102 04/16/13 0639 04/16/13 1156  GLUCAP 99 127* 151*    IV Fluid Intake:   . sodium chloride      MEDICATIONS  . amLODipine  10 mg Oral Daily  . antiseptic oral rinse  15 mL Mouth  Rinse BID  . aspirin  325 mg Oral Daily  . atenolol  25 mg Oral BID  . cloNIDine  0.3 mg Transdermal Q Tue  . fesoterodine  8 mg Oral Daily  . heparin subcutaneous  5,000 Units Subcutaneous 3 times per day  . hydrALAZINE  75 mg Oral 3 times per day  . insulin aspart  0-5 Units Subcutaneous QHS  . insulin aspart  0-9 Units Subcutaneous TID WC  . polyethylene glycol  17 g Oral BID  . senna-docusate  1 tablet Oral QHS  . simvastatin  10 mg Oral q1800   PRN:  acetaminophen, clonazePAM, hydrALAZINE, magnesium citrate, ondansetron (ZOFRAN) IV, prochlorperazine, RESOURCE THICKENUP CLEAR, sodium chloride  Diet:  Dysphagia 3 thin liquids Activity:  As tolerated DVT Prophylaxis:  Lovenox 40 mg sq daily   CLINICALLY SIGNIFICANT STUDIES Basic Metabolic Panel:   Recent Labs Lab 04/16/13 0627 04/16/13 1318  NA 144 147  K 3.8 4.2  CL 107 108  CO2 24 24  GLUCOSE 126* 148*  BUN 31* 32*  CREATININE 1.57* 1.57*  CALCIUM 9.1 9.3   Liver Function Tests:   Recent Labs Lab 04/10/13 0940  AST 44*  ALT 16  ALKPHOS 65  BILITOT 0.4  PROT 7.1  ALBUMIN 3.3*   CBC:   Recent Labs Lab 04/13/13 0545 04/16/13 1318  WBC 11.2* 9.8  HGB 12.1 12.8  HCT 36.9 39.2  MCV 85.6 87.1  PLT 276 312  Coagulation:  No results found for this basename: LABPROT, INR,  in the last 168 hours Cardiac Enzymes:  No results found for this basename: CKTOTAL, CKMB, CKMBINDEX, TROPONINI,  in the last 168 hours Urinalysis:  No results found for this basename: COLORURINE, APPERANCEUR, LABSPEC, PHURINE, GLUCOSEU, HGBUR, BILIRUBINUR, KETONESUR, PROTEINUR, UROBILINOGEN, NITRITE, LEUKOCYTESUR,  in the last 168 hours Lipid Panel    Component Value Date/Time   CHOL 171 04/09/2013 0725   TRIG 241* 04/09/2013 0725   HDL 33* 04/09/2013 0725   CHOLHDL 5.2 04/09/2013 0725   VLDL 48* 04/09/2013 0725   LDLCALC 90 04/09/2013 0725   HgbA1C  Lab Results  Component Value Date   HGBA1C 6.4* 04/09/2013    Urine Drug Screen:      Component Value Date/Time   LABOPIA NONE DETECTED 04/08/2013 1624   COCAINSCRNUR NONE DETECTED 04/08/2013 1624   LABBENZ NONE DETECTED 04/08/2013 1624   AMPHETMU NONE DETECTED 04/08/2013 1624   THCU NONE DETECTED 04/08/2013 1624   LABBARB NONE DETECTED 04/08/2013 1624    Alcohol Level:  No results found for this basename: ETH,  in the last 168 hours  CT of the brain   04/10/2013 The known recent left cerebellar infarct is better define currently than on studies obtained 2 days prior. There is extensive  supratentorial small vessel disease as well as a prior small left parietal lobe infarct. No convincing new infarct is seen in these areas, although a small infarct could easily be obscured on CT by the degree of underlying small vessel disease. There is no acute hemorrhage or mass effect. The middle cerebral arteries do not show increased attenuation on either side. 04/08/2013   1. No acute abnormality. 2. Stable chronic small vessel white matter ischemic changes and old lacunar infarcts.   MRI of the brain   04/12/2013 (limited) 1. New, acute right PCA territory infarction. 2. Pre-existing left cerebellar infarct is mildly larger. 04/08/2013  Sub cm acute infarction within the left cerebellum. No other acute infarction. Extensive old ischemic changes throughout the hemispheric white matter with watershed infarction pattern on the right.    MRA of the brain  04/08/2013  Chronic right internal carotid artery occlusion. Anterior circulation supply occurs via a widely patent left internal carotid artery with patent anterior communicating artery.  Widely patent dominant right vertebral artery. Reconstituted flow in the left vertebral artery.  No flow demonstrated in the right posterior cerebral artery on today's study. This is new since the previous exam. However, no infarction is seen in the right PCA territory as might be expected.    2D Echocardiogram  EF 55-60% with no source of embolus.   Carotid Doppler   Left: CEA patent. 1-39% ICA stenosis. Right: Vertebral artery flow is antegrade. Left: Vertebral artery flow is retrograde.   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 May be source of embolus and would Be Rx with dual antiplatelet Rx  CXR   04/09/2013 No acute cardiopulmonary disease. 04/08/2013  No acute cardiopulmonary disease.     EKG  normal sinus rhythm. For complete results please see formal report.   Therapy Recommendations - inpatient rehabilitation recommended.  Physical Exam   Middle aged Caucasian lady  In no distress  Afebrile. Head is nontraumatic. Neck is supple without bruit. Hearing is normal. Cardiac exam no murmur or gallop. Lungs are clear to auscultation. Distal pulses are well felt. She is sleeping and appears to have witnessed apnea suggestive of sleep apnea. Neurological Exam : She is quite drowsy today. She comes her eyes momentarily to mild sternal rub. She seemed to have apraxia of eyelid opening but a follow commands seen to arise are closed.Dense left homonymous hemianopsia. Mild dysarthria no aphasia. PERRL, EOMI, decreased blink to threat on the left,  no nystagmus. Mild left lower facial asymmetry. Tongue midline. Motor system exam reveals marked LUE 2/5 and now LLE weakness  0/5 Sensation is preserved. Gait was not tested. Left upgoing toe.   ASSESSMENT Ms. Kelli [name] is a 63 y.o. female presenting with left sided numbness and weakness, progressing to nausea and vomiting. Imaging confirms a left cerebellar infarct. Infarct felt to be thrombotic secondary to small vessel disease.  Patient then developed a new right PCA territory infarct. Etiology now felt to be  embolic. [name] with possible arch plaque; loop placed. On aspirin 81 mg orally every day prior to admission.  enrolled in [facility], ticagrelor vs aspirin early in hospital stay for secondary stroke prevention. Due to intolerance (rash), study drug was discontinued and pt was placed on aspirin alone. Patient with resultant mild LUE weakness, nausea and vomiting.   Hypertension - recently stopped labetalol and switched to atenolol at pt request. Blood pressure running high at times. Continue to monitor. Hyperlipidemia, LDL 126, on no statin PTA, now on statin, goal LDL < 100 - Now on Zocor. Chronic kidney disease, stage 3 - follow up with nephrology planned in March. Holding off ARB or ACE Depression - hx of. Dr. Domenic Polite concerned it may need further management Fibromyalgia  Cigarette smoker  Obesity, Body mass index is 36.23 kg/(m^2).  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 S/p L CEA on 10/03/2012 by Dr. Donnetta Hutching. No hx heart failure - ejection fraction 55-60% by recent echo Suspected Obstructive sleep apnea - needs formal dx, consider OP sleep study Headache   Hospital day # 8  TREATMENT/PLAN  Continue aspirin 325 mg orally every day for secondary stroke prevention. She will be continued to be followed by [facility] study.   f/u CT recommended in am to r/o penetrating ulcer. May be source of embolus. If present, would Be Rx with dual antiplatelet Rx  inpatient rehabilitation at discharge  [facility], MSN, RN, ANVP-BC, ANP-BC, [facility] Pager: [phone] 04/16/2013 7:31 PM  I have personally obtained a history, examined the patient, evaluated imaging results, and formulated the assessment and plan of care. I agree with the above.  Antony Contras, MD

## 2013-04-16 NOTE — Interval H&P Note (Signed)
History and Physical Interval Note:  04/16/2013 8:43 AM  Kelli [name]  has presented today for surgery, with the diagnosis of stroke  The various methods of treatment have been discussed with the patient and family. After consideration of risks, benefits and other options for treatment, the patient has consented to  Procedure(s): TRANSESOPHAGEAL ECHOCARDIOGRAM ([name]) (N/A) as a surgical intervention .  The patient's history has been reviewed, patient examined, no change in status, stable for surgery.  I have reviewed the patient's chart and labs.  Questions were answered to the patient's satisfaction.     Jenkins Rouge

## 2013-04-16 NOTE — Interval H&P Note (Signed)
History and Physical Interval Note: [name] without obvious etiology for stroke. Will plan ILR. 04/16/2013 10:51 AM  Kelli [name]  has presented today for surgery, with the diagnosis of Syncope  The various methods of treatment have been discussed with the patient and family. After consideration of risks, benefits and other options for treatment, the patient has consented to  Procedure(s): LOOP RECORDER IMPLANT (N/A) as a surgical intervention .  The patient's history has been reviewed, patient examined, no change in status, stable for surgery.  I have reviewed the patient's chart and labs.  Questions were answered to the patient's satisfaction.     Mikle Bosworth.D.

## 2013-04-16 NOTE — Progress Notes (Signed)
PT Cancellation Note  Patient Details Name: Kelli [name] MRN: [id number] DOB: [birth year]   Cancelled Treatment:    Reason Eval/Treat Not Completed: Medical issues which prohibited therapy. Patient on bedrest x24 hours. Will see patient tomorrow   Jacqualyn Posey 04/16/2013, 1:56 PM

## 2013-04-16 NOTE — CV Procedure (Signed)
EP Procedure Note  Procedure: Insertion of an ILR  Indication: cryptogenic stroke  Description of the procedure: After informed consent was obtained, the patient was prepped and draped in the usual manner. 30 cc of lidocaine was infiltrated into the left pectoral region. A 1 cm in stab incision was carried out over this region and the Medtronic implantable loop recorder serial number [id number] was inserted into the subcutaneous space. The R waves measured 0.3 mV. P waves are present. Benzoin and Steri-Strips for pain on the skin. A pressure dressing was placed, and the patient was returned to her room in satisfactory condition.   Complications: No immediate procedural complications  Conclusion: Successful insertion of a Medtronic implantable loop recorder in a patient with cryptogenic stroke  Cristopher Peru, M.D.

## 2013-04-16 NOTE — H&P (View-Only) (Signed)
ELECTROPHYSIOLOGY CONSULT NOTE   Patient ID: MYCHAEL BIERNAT MRN: [id number], DOB/AGE: 1950-11-15   Admit date: 04/08/2013 Date of Consult: 04/16/2013  Primary Physician: Kelli Alto, MD Primary Cardiologist: Kelli Polite, MD Reason for Consultation: Cryptogenic stroke; recommendations regarding Implantable Loop Recorder  History of Present Illness Kelli [name] is a 63 year old female with HTN, DM, CKD, PVD and prior CVA was admitted on 04/08/2013 with acute CVA. She has been monitored on telemetry which has demonstrated no evidence of atrial fibrillation. No cause has been identified. Inpatient stroke work-up is to be completed with a [name]. EP has been asked to evaluate for placement of an implantable loop recorder to monitor for atrial fibrillation. Of note, Dr. Domenic [name] has been following her for refractory hypertension. Recent Lexiscan Myoview stress test negative for ischemia.  Past Medical History Past Medical History  Diagnosis Date  . Essential hypertension, benign   . Fibromyalgia   . Cerebral infarction Aug 2014    Bihemispheric watershed infarcts  . Mixed hyperlipidemia   . Borderline diabetes   . Urinary incontinence   . Carotid artery occlusion     Occluded RICA, status post left CEA  August 2014 - Kelli [name]  . CKD (chronic kidney disease) stage 3, GFR 30-59 ml/min   . 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 Urethral dilation  1980's  . Combined hysterectomy vaginal w/ mmk / a&p repair  1981  . Vaginal hysterectomy  1981    "partial" (10/04/2012)  . Endarterectomy Left 10/06/2012    Procedure: Carotid Endarterectomy with Finesse patch angioplasty;  Surgeon: Kelli Posner, MD;  Location: [facility];  Service: Vascular;  Laterality: Left;    Allergies/Intolerances Allergies  Allergen Reactions  . Hydrochlorothiazide Nausea And Vomiting    Inpatient  Medications . [MAR HOLD] amLODipine  10 mg Oral Daily  . [facility] HOLD] antiseptic oral rinse  15 mL Mouth Rinse BID  . [facility] HOLD] aspirin  325 mg Oral Daily  . [facility] HOLD] atenolol  25 mg Oral BID  . [facility] HOLD] cloNIDine  0.3 mg Transdermal Q Tue  . [MAR HOLD] enoxaparin (LOVENOX) injection  40 mg Subcutaneous QHS  . [facility] HOLD] fesoterodine  8 mg Oral Daily  . [facility] HOLD] hydrALAZINE  75 mg Oral 3 times per day  . [MAR HOLD] insulin aspart  0-5 Units Subcutaneous QHS  . [MAR HOLD] insulin aspart  0-9 Units Subcutaneous TID WC  . [MAR HOLD] polyethylene glycol  17 g Oral BID  . [MAR HOLD] senna-docusate  1 tablet Oral QHS  . [facility] HOLD] simvastatin  10 mg Oral q1800   . sodium chloride 20 mL/hr at 04/13/13 [id number]    Social History History   Social History  . Marital Status: Married    Spouse Name: N/A    Number of Children: N/A  . Years of Education: N/A   Occupational History  . Not on file.   Social History Main Topics  . Smoking status: Former Smoker -- 1.00 packs/day for 44 years    Types: Cigarettes    Quit date: 10/03/2012  . Smokeless tobacco: Never Used  . Alcohol Use: No  . Drug Use: No  . Sexual Activity: Yes    Birth Control/ Protection: Surgical   Other Topics Concern  . Not on file   Social History Narrative  . No narrative  on file    Review of Systems General: No chills, fever, night sweats or weight changes  Cardiovascular:  No chest pain, dyspnea on exertion, edema, orthopnea, palpitations, paroxysmal nocturnal dyspnea Dermatological: No rash, lesions or masses Respiratory: No cough, dyspnea Urologic: No hematuria, dysuria Abdominal: No nausea, vomiting, diarrhea, bright red blood per rectum, melena, or hematemesis Neurologic: No visual changes, weakness, changes in mental status All other systems reviewed and are otherwise negative except as noted above.  Physical Exam Blood pressure 256/84, pulse 59, temperature 98.1 F (36.7 C), temperature source  Oral, resp. rate 13, height 5\' 5"  (1.651 m), weight 217 lb 11.2 oz (98.748 kg), SpO2 98.00%.  General: Well developed, well appearing 63 y.o. female in no acute distress. HEENT: Normocephalic, atraumatic. Sclera nonicteric. Oropharynx clear.  Neck: Supple. No JVD. Lungs: Respirations regular and unlabored, CTA bilaterally. No wheezes, rales or rhonchi. Heart: RRR. S1, S2 present. No murmurs, rub, S3 or S4. Abdomen: Soft, non-distended.  Extremities: No clubbing, cyanosis or edema.  Psych: Normal affect. Neuro: Alert and oriented X 3. Moves all extremities spontaneously. Musculoskeletal: No kyphosis. Skin: Intact. Warm and dry. No rashes or petechiae in exposed areas.   Labs Lab Results  Component Value Date   WBC 11.2* 04/13/2013   HGB 12.1 04/13/2013   HCT 36.9 04/13/2013   MCV 85.6 04/13/2013   PLT 276 04/13/2013    Recent Labs Lab 04/10/13 0940  04/16/13 0627  NA 141  < > 144  K 4.6  < > 3.8  CL 104  < > 107  CO2 23  < > 24  BUN 24*  < > 31*  CREATININE 1.45*  < > 1.57*  CALCIUM 8.6  < > 9.1  PROT 7.1  --   --   BILITOT 0.4  --   --   ALKPHOS 65  --   --   ALT 16  --   --   AST 44*  --   --   GLUCOSE 186*  < > 126*  < > = values in this interval not displayed.   Radiology/Studies Ct Head Wo Contrast  04/12/2013   CLINICAL DATA:  Lethargy and increased left-sided weakness.  EXAM: CT HEAD WITHOUT CONTRAST  TECHNIQUE: Contiguous axial images were obtained from the base of the skull through the vertex without intravenous contrast.  COMPARISON:  04/10/2013 head CT and 04/08/2013 brain MRI  FINDINGS: There is new hypoattenuation within the posteromedial right temporal lobe and right occipital lobe as well as right cerebral peduncle. Focus of hypoattenuation in the left cerebellum corresponds to acute infarct described on recent MRI. There is no evidence of intracranial hemorrhage. Patchy areas of hypoattenuation within the white matter of both cerebral hemispheres are compatible  with moderate chronic small vessel ischemic disease and similar to the prior study. There is no evidence of mass or midline shift. No extra-axial fluid collection.  Mastoid air cells are clear. There is mild posterior left ethmoid air cell mucosal thickening. Prior bilateral cataract surgery is noted.  IMPRESSION: Findings concerning for acute right PCA infarct.  These results were called by telephone at the time of interpretation on 04/12/2013 at 12:53 PM to Kelli [name], Janann Colonel , who verbally acknowledged these results.   Electronically Signed   By: Kelli [name]   On: 04/12/2013 12:53   Dg Chest Port 1 View  04/08/2013   CLINICAL DATA:  Stroke.  Productive cough.  EXAM: PORTABLE CHEST - 1 VIEW  COMPARISON:  DG CHEST 2  VIEW dated 01/16/2013; DG CHEST 2 VIEW dated 10/01/2012  FINDINGS: Cardiopericardial silhouette within normal limits. Mediastinal contours normal. Trachea midline. No airspace disease or effusion. Basilar atelectasis is present, prominent on the left but similar to prior. Monitoring leads project over the chest.  IMPRESSION: No acute cardiopulmonary disease.   Electronically Signed   By: Kelli [name] M.D.   On: 04/08/2013 23:58   Mr Brain Ltd W/o Cm  04/13/2013   CLINICAL DATA:  Evaluate for new infarct.  EXAM: MRI HEAD WITHOUT CONTRAST  TECHNIQUE: Multi planar diffusion imaging of the brain and surrounding structures were obtained without intravenous contrast. Limited exam requested for clinical surface.  COMPARISON:  04/09/2011  FINDINGS: There is restricted diffusion in the entirety of the right PCA territory, including right occipital lobe, mesial and posterior right temporal lobe, mid brain and posterior right thalamus. There is no gross hemorrhage within the new infarct. In early subacute infarct in the central left cerebellum is again seen, slightly fuller in size, now 9 mm. No acute anterior circulation infarct is seen. There is remote bilateral border zone infarcts in this patient with  chronic right ICA occlusion.  These results were called by telephone at the time of interpretation on 04/13/2013 at 12:59 PM to Kelli [name] , who verbally acknowledged these results.  IMPRESSION: 1. New, acute right PCA territory infarction. 2. Pre-existing left cerebellar infarct is mildly larger.   Electronically Signed   By: Kelli [name] M.D.   On: 04/13/2013 13:02    Echocardiogram  Study Conclusions - Left ventricle: The cavity size was normal. There was moderate concentric hypertrophy. Systolic function was normal. The estimated ejection fraction was in the range of 55% to 60%. Wall motion was normal; there were no regional wall motion abnormalities. - Mitral valve: Calcified annulus. Mildly thickened leaflets. - Pericardium, extracardiac: A trivial pericardial effusion was identified posterior to the heart.  12-lead ECG on admission - NSR at 95 bpm Telemetry reviewed - SR; brief, nonsustained atrial tachycardia lasting <30 seconds; no evidence of atrial fibrillation  Assessment and Plan 1. Cryptogenic stroke 2. HTN 3. CKD 4. PVD 5. Tobacco abuse  If the [name] is negative, we recommend loop recorder insertion to monitor for AF. The indication for loop recorder insertion / monitoring for AF in setting of cryptogenic stroke was discussed with the patient. The loop recorder insertion procedure was reviewed in detail including risks and benefits. These risks include but are not limited to bleeding and infection. The patient expressed verbal understanding and agrees to proceed. These recommendations were also discussed in detail with the patient's husband and sister who assisted with consent. Dr. Lovena Le to see.  Signed, Kelli [name] 04/16/2013, 9:10 AM    EP Attending  Patient seen and examined. Agree with above history, exam, assessment and plan. Will plan ILR insertion if [name] demonstrates no cause for cryptogenic stroke.   Kelli [name].D.

## 2013-04-16 NOTE — Progress Notes (Signed)
UR COMPLETED  

## 2013-04-16 NOTE — Progress Notes (Signed)
[name] scheduled for 9am. EP team aware and is seeing patient in consult this AM. Melina Copa PA-C

## 2013-04-16 NOTE — Progress Notes (Signed)
  Echocardiogram Echocardiogram Transesophageal has been performed.  Philipp Deputy 04/16/2013, 10:22 AM

## 2013-04-16 NOTE — CV Procedure (Signed)
     Primary Cardiologist:  Johnny Bridge    Transesophageal Echocardiogram: Indication: CVA Sedation: Versed: 6, Fentanyl: 50, Other:  0 ASA: 3, Airway: 2  Procedure:  The patient was moderately sedated with the above doses of versed and fentanyl.  Using digital technique an omniplane probe was advanced into the distal esophagus without incident. Transgastric imaging revealed normal LV function with LVH  with no RWMA;s and no mural apical thrombus.   Estimated ejection fraction was 65%.  Right sided cardiac chambers were normal with no evidence of pulmonary hypertension.  The pulmonary and tricuspid valves were structurally normal.  There was trivial TR The mitral valve was structurally normal with no  mitral regurgitation.    The aortic valve was trileaflet with no stenosis or regurgitation one small lambl's excresence  The aortic root was normal.    Imaging of the septum showed no ASD or VSD Bubble study was negative for shunt 2D and color flow confirmed no PFO  The LAE was well visualized in orthogonal views.  There was no spontaneous contrast and no thrombus.    The descending thoracic aorta had bulky mobile areas of grade 4 aortic debris.  Most of the debris is distal to the left subclavian But there is one area of mobile plaque near the arch.    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  May be source of embolus and would Be Rx with dual antiplatelet Rx  Jenkins Rouge 04/16/2013 10:07 AM

## 2013-04-16 NOTE — Progress Notes (Signed)
Rehab Admissions - Spoke with pt's husband and son by phone. Husband strongly interested in pursuing inpatient rehab for his wife and stated that the family "will do whatever it takes" to arrange for supervision/minimal assistance after the completion of inpatient rehab stay. Will meet with husband/pt later today to begin pre-admission assessment and will open case with [employer].  Will keep pt/family and medical team posted on any updates from insurance as we seek authorization. Please call me with any questions. Thanks.  Nanetta Batty, PT Rehabilitation Admissions Coordinator [phone]

## 2013-04-16 NOTE — Discharge Instructions (Signed)
° °  Supplemental Discharge Instructions for  [facility] [employer] [facility]ion Patients   WOUND CARE   Keep the wound area clean and dry.  Do not get this area wet for one week.  No showers for one week; you may shower on 04/23/2013.   The tape/steri-strips on your wound will fall off; do not pull them off.  No bandage is needed on the site.  DO NOT apply any creams, oils, or ointments to the wound area.   If you notice any drainage or discharge from the wound, any swelling or bruising at the site, or you develop a fever > 101? F after you are discharged home, call the office at once.

## 2013-04-16 NOTE — Progress Notes (Signed)
Speech Language Pathology Treatment: Dysphagia;Cognitive-Linquistic  Patient Details Name: Kelli [name] MRN: [id number] DOB: 1950-06-08 Today's Date: 04/16/2013 Time: [id number] SLP Time Calculation (min): 18 min  Assessment / Plan / Recommendation Clinical Impression  Treatment focused on cognitive reorganization and diagnostic assessment for possible liquid consistency upgrade.  She required moderate cues to facilitate intellectual awareness and reasoning/outcomes of decision making regarding thick liquids.  Pt. Admitted she has been consuming sips of Coke without thickener.  SLP educated pt. On clinical reasoning of thickened liquids and potential outcomes of thin liquids other than water (followed by oral care).  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  Pt. Has not had an objective assessment of swallow.  Recommend MBS to fully assess orophayrngeal  Function as she has been unable to upgrade at bedside thus far.  Recommend continue Dys 2 diet texture, nectar liquids, water protocol, cognitive treatment and MBS.  No family present during session.   HPI HPI: Kelli [name] is an 63 y.o. female a history of hypertension, hyperlipidemia, watershed cerebral infarctions, right ICA occlusion and left CEA, presenting with new onset vertigo with nausea and vomiting as well as numbness involving left side of face and left upper extremity and left upper extremity weakness. Onset of symptoms was at 2:30 PM today 04/08/2013. Patient has been taking aspirin 81 mg per day per chart review.  She presented to the emergency room at [facility] initially. She was noted initially deemed a candidate for TPA. However, she became worse with increasing nausea and vomiting as well as vertigo and worsening of numbness and left-sided weakness. Speech also became somewhat slurred. CTA was recommended by Specialist on-Call neurologist.  She was subsequently transferred here for MRI and MRA study to rule out basal artery thrombosis. MRI showed findings consistent with acute small left cerebellar ischemic infarction. There is no evidence of basilar artery occlusion or significant stenosis. No evidence of thrombus was seen. NIH stroke score on [facility]. Patient had dark colored emesis which was guaiac positive. Patient was not administerd TPA.  Pt had speech evaluation yesterday and was noted to be coughing with intake, which pt states has occured since hospitalization.  Bedside evaluation completed yesterday and diet modified to puree/nectar.  Poor intake ongoing due to dysphagia, lethargy and displeasure with po diet.  Pt with worsening neuro symptoms had repeat CT head today, no results in yet.     Pertinent Vitals WDL  SLP Plan  Continue with current plan of care    Recommendations Diet recommendations: Nectar-thick liquid;Dysphagia 2 (fine chop) Liquids provided via: Cup Medication Administration: Whole meds with puree Supervision: Full supervision/cueing for compensatory strategies Compensations: Slow rate;Small sips/bites;Check for pocketing Postural Changes and/or Swallow Maneuvers: Seated upright 90 degrees;Upright 30-60 min after meal              Oral Care Recommendations: Oral care BID Follow up Recommendations: Inpatient Rehab Plan: Continue with current plan of care    GO     Houston Siren M.Ed [employer] [phone]  04/16/2013

## 2013-04-17 ENCOUNTER — Inpatient Hospital Stay (HOSPITAL_COMMUNITY)
Admission: RE | Admit: 2013-04-17 | Discharge: 2013-05-08 | DRG: 945 | Disposition: A | Discharge status: Skilled Nursing Facility | Payer: 59 | Source: Intra-hospital | Attending: Physical Medicine & Rehabilitation | Admitting: Physical Medicine & Rehabilitation

## 2013-04-17 ENCOUNTER — Inpatient Hospital Stay (HOSPITAL_COMMUNITY): Payer: 59

## 2013-04-17 ENCOUNTER — Encounter (HOSPITAL_COMMUNITY): Payer: Self-pay | Admitting: Cardiovascular Disease

## 2013-04-17 DIAGNOSIS — I6330 Cerebral infarction due to thrombosis of unspecified cerebral artery: Secondary | ICD-10-CM

## 2013-04-17 DIAGNOSIS — R1310 Dysphagia, unspecified: Secondary | ICD-10-CM | POA: Diagnosis present

## 2013-04-17 DIAGNOSIS — I10 Essential (primary) hypertension: Secondary | ICD-10-CM | POA: Diagnosis present

## 2013-04-17 DIAGNOSIS — Z5189 Encounter for other specified aftercare: Principal | ICD-10-CM

## 2013-04-17 DIAGNOSIS — R202 Paresthesia of skin: Secondary | ICD-10-CM | POA: Diagnosis present

## 2013-04-17 DIAGNOSIS — M25512 Pain in left shoulder: Secondary | ICD-10-CM | POA: Diagnosis present

## 2013-04-17 DIAGNOSIS — Z8249 Family history of ischemic heart disease and other diseases of the circulatory system: Secondary | ICD-10-CM

## 2013-04-17 DIAGNOSIS — R531 Weakness: Secondary | ICD-10-CM | POA: Diagnosis present

## 2013-04-17 DIAGNOSIS — N183 Chronic kidney disease, stage 3 unspecified: Secondary | ICD-10-CM

## 2013-04-17 DIAGNOSIS — R5381 Other malaise: Secondary | ICD-10-CM | POA: Diagnosis present

## 2013-04-17 DIAGNOSIS — N390 Urinary tract infection, site not specified: Secondary | ICD-10-CM | POA: Diagnosis not present

## 2013-04-17 DIAGNOSIS — M7500 Adhesive capsulitis of unspecified shoulder: Secondary | ICD-10-CM | POA: Diagnosis present

## 2013-04-17 DIAGNOSIS — N318 Other neuromuscular dysfunction of bladder: Secondary | ICD-10-CM | POA: Diagnosis present

## 2013-04-17 DIAGNOSIS — Z833 Family history of diabetes mellitus: Secondary | ICD-10-CM

## 2013-04-17 DIAGNOSIS — R5383 Other fatigue: Secondary | ICD-10-CM

## 2013-04-17 DIAGNOSIS — R7309 Other abnormal glucose: Secondary | ICD-10-CM | POA: Diagnosis present

## 2013-04-17 DIAGNOSIS — F411 Generalized anxiety disorder: Secondary | ICD-10-CM | POA: Diagnosis present

## 2013-04-17 DIAGNOSIS — IMO0001 Reserved for inherently not codable concepts without codable children: Secondary | ICD-10-CM | POA: Diagnosis present

## 2013-04-17 DIAGNOSIS — R7303 Prediabetes: Secondary | ICD-10-CM | POA: Diagnosis present

## 2013-04-17 DIAGNOSIS — I69991 Dysphagia following unspecified cerebrovascular disease: Secondary | ICD-10-CM

## 2013-04-17 DIAGNOSIS — R209 Unspecified disturbances of skin sensation: Secondary | ICD-10-CM | POA: Diagnosis present

## 2013-04-17 DIAGNOSIS — K5900 Constipation, unspecified: Secondary | ICD-10-CM | POA: Diagnosis present

## 2013-04-17 DIAGNOSIS — G8190 Hemiplegia, unspecified affecting unspecified side: Secondary | ICD-10-CM | POA: Diagnosis present

## 2013-04-17 DIAGNOSIS — E782 Mixed hyperlipidemia: Secondary | ICD-10-CM | POA: Diagnosis present

## 2013-04-17 DIAGNOSIS — I129 Hypertensive chronic kidney disease with stage 1 through stage 4 chronic kidney disease, or unspecified chronic kidney disease: Secondary | ICD-10-CM | POA: Diagnosis present

## 2013-04-17 DIAGNOSIS — Z8673 Personal history of transient ischemic attack (TIA), and cerebral infarction without residual deficits: Secondary | ICD-10-CM

## 2013-04-17 DIAGNOSIS — I639 Cerebral infarction, unspecified: Secondary | ICD-10-CM | POA: Diagnosis present

## 2013-04-17 DIAGNOSIS — Z7982 Long term (current) use of aspirin: Secondary | ICD-10-CM

## 2013-04-17 DIAGNOSIS — A498 Other bacterial infections of unspecified site: Secondary | ICD-10-CM | POA: Diagnosis not present

## 2013-04-17 DIAGNOSIS — B9620 Unspecified Escherichia coli [E. coli] as the cause of diseases classified elsewhere: Secondary | ICD-10-CM | POA: Diagnosis not present

## 2013-04-17 LAB — GLUCOSE, CAPILLARY
GLUCOSE-CAPILLARY: 121 mg/dL — AB (ref 70–99)
GLUCOSE-CAPILLARY: 132 mg/dL — AB (ref 70–99)
Glucose-Capillary: 121 mg/dL — ABNORMAL HIGH (ref 70–99)
Glucose-Capillary: 89 mg/dL (ref 70–99)

## 2013-04-17 LAB — BASIC METABOLIC PANEL
BUN: 33 mg/dL — ABNORMAL HIGH (ref 6–23)
CALCIUM: 9.1 mg/dL (ref 8.4–10.5)
CO2: 24 mEq/L (ref 19–32)
CREATININE: 1.45 mg/dL — AB (ref 0.50–1.10)
Chloride: 109 mEq/L (ref 96–112)
GFR calc Af Amer: 44 mL/min — ABNORMAL LOW (ref >90)
GFR calc non Af Amer: 38 mL/min — ABNORMAL LOW (ref >90)
Glucose, Bld: 125 mg/dL — ABNORMAL HIGH (ref 70–99)
Potassium: 4 mEq/L (ref 3.7–5.3)
Sodium: 148 mEq/L — ABNORMAL HIGH (ref 137–147)

## 2013-04-17 MED ORDER — INSULIN ASPART 100 UNIT/ML ~~LOC~~ SOLN
0.0000 [IU] | Freq: Three times a day (TID) | SUBCUTANEOUS | Status: DC
  Administered 2013-04-18 – 2013-04-26 (×4): 1 [IU] via SUBCUTANEOUS
  Administered 2013-04-27: 2 [IU] via SUBCUTANEOUS
  Administered 2013-04-27 – 2013-04-30 (×3): 1 [IU] via SUBCUTANEOUS
  Administered 2013-04-30: 2 [IU] via SUBCUTANEOUS
  Administered 2013-05-01: 11:00:00 via SUBCUTANEOUS
  Administered 2013-05-01 – 2013-05-06 (×11): 1 [IU] via SUBCUTANEOUS
  Administered 2013-05-06 – 2013-05-07 (×2): 2 [IU] via SUBCUTANEOUS

## 2013-04-17 MED ORDER — RESOURCE THICKENUP CLEAR PO POWD
ORAL | Status: DC | PRN
  Administered 2013-04-23 – 2013-04-24 (×3): via ORAL

## 2013-04-17 MED ORDER — ATENOLOL 25 MG PO TABS
25.0000 mg | ORAL_TABLET | Freq: Two times a day (BID) | ORAL | Status: DC
  Administered 2013-04-17 – 2013-04-18 (×3): 25 mg via ORAL
  Filled 2013-04-17 (×6): qty 1

## 2013-04-17 MED ORDER — ENOXAPARIN SODIUM 30 MG/0.3ML ~~LOC~~ SOLN
30.0000 mg | SUBCUTANEOUS | Status: DC
  Administered 2013-04-17 – 2013-05-07 (×21): 30 mg via SUBCUTANEOUS
  Filled 2013-04-17 (×23): qty 0.3

## 2013-04-17 MED ORDER — SENNOSIDES-DOCUSATE SODIUM 8.6-50 MG PO TABS
2.0000 | ORAL_TABLET | Freq: Every day | ORAL | Status: DC
  Administered 2013-04-17 – 2013-04-29 (×12): 2 via ORAL
  Filled 2013-04-17 (×12): qty 2

## 2013-04-17 MED ORDER — ALUM & MAG HYDROXIDE-SIMETH 200-200-20 MG/5ML PO SUSP
30.0000 mL | ORAL | Status: DC | PRN
  Administered 2013-04-23: 30 mL via ORAL
  Filled 2013-04-17: qty 30

## 2013-04-17 MED ORDER — SODIUM CHLORIDE 0.9 % IV SOLN
Freq: Every day | INTRAVENOUS | Status: DC
  Administered 2013-04-17 – 2013-04-18 (×2): via INTRAVENOUS

## 2013-04-17 MED ORDER — SODIUM CHLORIDE 0.9 % IV SOLN: INTRAVENOUS | Status: DC

## 2013-04-17 MED ORDER — FESOTERODINE FUMARATE ER 8 MG PO TB24
8.0000 mg | ORAL_TABLET | Freq: Every day | ORAL | Status: DC
Start: 2013-04-18 — End: 2013-04-18
  Administered 2013-04-18: 8 mg via ORAL
  Filled 2013-04-17 (×2): qty 1

## 2013-04-17 MED ORDER — ASPIRIN EC 81 MG PO TBEC
81.0000 mg | DELAYED_RELEASE_TABLET | Freq: Every day | ORAL | Status: DC
  Filled 2013-04-17: qty 1

## 2013-04-17 MED ORDER — BIOTENE DRY MOUTH MT LIQD
15.0000 mL | Freq: Two times a day (BID) | OROMUCOSAL | Status: DC
  Administered 2013-04-17 – 2013-05-08 (×38): 15 mL via OROMUCOSAL

## 2013-04-17 MED ORDER — CLONAZEPAM 0.5 MG PO TABS
0.5000 mg | ORAL_TABLET | Freq: Two times a day (BID) | ORAL | Status: DC | PRN
  Administered 2013-04-17: 0.5 mg via ORAL
  Filled 2013-04-17: qty 1

## 2013-04-17 MED ORDER — INSULIN ASPART 100 UNIT/ML ~~LOC~~ SOLN: 0.0000 [IU] | Freq: Every day | SUBCUTANEOUS | Status: DC

## 2013-04-17 MED ORDER — GUAIFENESIN-DM 100-10 MG/5ML PO SYRP: 5.0000 mL | ORAL_SOLUTION | Freq: Four times a day (QID) | ORAL | Status: DC | PRN

## 2013-04-17 MED ORDER — PROCHLORPERAZINE MALEATE 5 MG PO TABS
5.0000 mg | ORAL_TABLET | Freq: Four times a day (QID) | ORAL | Status: DC | PRN
  Administered 2013-05-01 – 2013-05-02 (×2): 5 mg via ORAL
  Filled 2013-04-17 (×2): qty 2

## 2013-04-17 MED ORDER — SALINE SPRAY 0.65 % NA SOLN
1.0000 | NASAL | Status: DC | PRN
  Filled 2013-04-17: qty 44

## 2013-04-17 MED ORDER — CLOPIDOGREL BISULFATE 75 MG PO TABS
75.0000 mg | ORAL_TABLET | Freq: Every day | ORAL | Status: DC
  Administered 2013-04-17: 75 mg via ORAL
  Filled 2013-04-17: qty 1

## 2013-04-17 MED ORDER — CLOPIDOGREL BISULFATE 75 MG PO TABS
75.0000 mg | ORAL_TABLET | Freq: Every day | ORAL | Status: DC
  Administered 2013-04-18 – 2013-05-08 (×21): 75 mg via ORAL
  Filled 2013-04-17 (×22): qty 1

## 2013-04-17 MED ORDER — BISACODYL 10 MG RE SUPP
10.0000 mg | Freq: Every day | RECTAL | Status: DC | PRN
  Administered 2013-04-19 – 2013-05-01 (×2): 10 mg via RECTAL
  Filled 2013-04-17 (×3): qty 1

## 2013-04-17 MED ORDER — FLEET ENEMA 7-19 GM/118ML RE ENEM: 1.0000 | ENEMA | Freq: Once | RECTAL | Status: AC | PRN

## 2013-04-17 MED ORDER — CLONIDINE HCL 0.3 MG/24HR TD PTWK
0.3000 mg | MEDICATED_PATCH | TRANSDERMAL | Status: DC
  Administered 2013-04-24 – 2013-05-08 (×3): 0.3 mg via TRANSDERMAL
  Filled 2013-04-17 (×3): qty 1

## 2013-04-17 MED ORDER — ASPIRIN EC 81 MG PO TBEC
81.0000 mg | DELAYED_RELEASE_TABLET | Freq: Every day | ORAL | Status: DC
  Administered 2013-04-18 – 2013-05-08 (×21): 81 mg via ORAL
  Filled 2013-04-17 (×22): qty 1

## 2013-04-17 MED ORDER — IOHEXOL 350 MG/ML SOLN
100.0000 mL | Freq: Once | INTRAVENOUS | Status: AC | PRN
  Administered 2013-04-17: 80 mL via INTRAVENOUS

## 2013-04-17 MED ORDER — PROCHLORPERAZINE 25 MG RE SUPP
12.5000 mg | Freq: Four times a day (QID) | RECTAL | Status: DC | PRN
  Filled 2013-04-17: qty 1

## 2013-04-17 MED ORDER — PROCHLORPERAZINE EDISYLATE 5 MG/ML IJ SOLN
5.0000 mg | Freq: Four times a day (QID) | INTRAMUSCULAR | Status: DC | PRN
  Filled 2013-04-17: qty 2

## 2013-04-17 MED ORDER — ACETAMINOPHEN 325 MG PO TABS
325.0000 mg | ORAL_TABLET | ORAL | Status: DC | PRN
  Administered 2013-04-17 – 2013-04-25 (×2): 650 mg via ORAL
  Filled 2013-04-17 (×2): qty 2

## 2013-04-17 MED ORDER — POLYETHYLENE GLYCOL 3350 17 G PO PACK
17.0000 g | PACK | Freq: Two times a day (BID) | ORAL | Status: DC
  Administered 2013-04-18 – 2013-05-02 (×18): 17 g via ORAL
  Filled 2013-04-17 (×44): qty 1

## 2013-04-17 MED ORDER — DICLOFENAC SODIUM 1 % TD GEL
2.0000 g | Freq: Four times a day (QID) | TRANSDERMAL | Status: DC
  Administered 2013-04-17 – 2013-05-07 (×63): 2 g via TOPICAL
  Filled 2013-04-17 (×2): qty 100

## 2013-04-17 MED ORDER — TRAZODONE HCL 50 MG PO TABS
25.0000 mg | ORAL_TABLET | Freq: Every evening | ORAL | Status: DC | PRN
  Administered 2013-04-25 – 2013-04-29 (×4): 50 mg via ORAL
  Filled 2013-04-17 (×5): qty 1

## 2013-04-17 MED ORDER — AMLODIPINE BESYLATE 10 MG PO TABS
10.0000 mg | ORAL_TABLET | Freq: Every day | ORAL | Status: DC
  Administered 2013-04-18 – 2013-05-08 (×21): 10 mg via ORAL
  Filled 2013-04-17 (×22): qty 1

## 2013-04-17 MED ORDER — DIPHENHYDRAMINE HCL 12.5 MG/5ML PO ELIX: 12.5000 mg | ORAL_SOLUTION | Freq: Four times a day (QID) | ORAL | Status: DC | PRN

## 2013-04-17 MED ORDER — SIMVASTATIN 10 MG PO TABS
10.0000 mg | ORAL_TABLET | Freq: Every day | ORAL | Status: DC
  Administered 2013-04-17 – 2013-05-07 (×21): 10 mg via ORAL
  Filled 2013-04-17 (×22): qty 1

## 2013-04-17 NOTE — Progress Notes (Signed)
Kelli [name] arrived to [phone] at approximately 17:25. Husband and son at bedside at time of arrival. Oriented pt and family to room and call bell. Pt made aware of rehab process and verbalizes understanding. Continue plan of care. Hawkin Charo, RN

## 2013-04-17 NOTE — Progress Notes (Signed)
Discharge orders received for inpatient rehab. Pt and family notified. Gave report to RN on 4W. Transported pt to 4W via bed.

## 2013-04-17 NOTE — Progress Notes (Signed)
Rehab admissions - I met with pt to share that we have received insurance approval from [facility] and pt signed needed paperwork. In addition, I went over the details of this paperwork with her husband by phone yesterday afternoon. Note that pt's 24 hr bedrest is now completed as of 11 am.  Spoke directly with Dr. Grandville Silos about possible admit to inpatient rehab later today and he stated if CT abd neg and cleared by neuro, can anticipate her admit to inpatient rehab later today. Dr. Naaman Plummer also a part of this discussion.  Please call me with any questions. Thanks.  Nanetta Batty, PT Rehabilitation Admissions Coordinator [phone]

## 2013-04-17 NOTE — Discharge Summary (Signed)
Physician Discharge Summary  Kelli [name] [id number] DOB: 13-Feb-1951 DOA: 04/08/2013  PCP: Manon Hilding, MD  Admit date: 04/08/2013 Discharge date: 04/17/2013  Time spent: 70 minutes  Recommendations for Outpatient Follow-up:  1. Followup with Dr. Leonie Man of neurology in 2 months. Patient will also need to keep her followup appointment for the Socrates trial. 2. Patient is to followup with Manon Hilding, MD 1-2 weeks post discharge from inpatient rehabilitation.  Discharge Diagnoses:  Principal Problem:   Acute CVA (cerebrovascular accident): R PCA infarct per MRI 04/13/13 Active Problems:   Acute ischemic stroke   Hypertension, uncontrolled   Occlusion and stenosis of carotid artery with cerebral infarction   Anxiety state, unspecified   CKD (chronic kidney disease) stage 3, GFR 30-59 ml/min   Depression   Diplopia   Left-sided weakness   Vertigo   Paresthesias in left hand   Pre-diabetes   Cerebellar stroke   Dizziness and giddiness   Stroke   Discharge Condition: Stable  Diet recommendation: Heart healthy dysphagia 2 diet with nectar thick liquids  Filed Weights   04/08/13 1507 04/08/13 2115  Weight: 97.523 kg (215 lb) 98.748 kg (217 lb 11.2 oz)    History of present illness:  Kelli [name] is a 63 y.o. Caucasian female with history of bihemispheric watershed strokes, left carotid artery stenosis status post left CEA in August of 2014, malignant hypertension, and prediabetes who initially presented to [facility] emergency department with complaints of dizziness, left-sided numbness, tingling, and weakness. Patient is slightly lethargic from the Phenergan she received, but arouses easily. Family at bedside provided most of the history. Symptoms started approximately 2 p.m. today while she was reading, she noticed sudden onset of left-sided numbness, tingling, and dizziness. She was brought to the emergency department at [facility], code stroke was called and she was  transferred to [facility] as they do not have MRI capabilities on the weekend. MRI of the brain showed acute infarct within the left cerebellum, patient was evaluated by neurology. Hospitalist service was asked that the patient for further care and management. Patient denies any recent fevers, chills,chest pain, shortness of breath, abdominal pain, or diarrhea. Patient reported having nausea and vomiting at [facility] for which she received Phenergan. [name] emesis was gastric occult positive, however no overt bleeding was noted.      Hospital Course:  #1Acute left cerebellar stroke/ new acute right PCA territory infarction  -History of left carotid endarterectomy  Patient was admitted in transfer when to present for stroke workup. Initial MRI MRA of the head which was done on transfer was consistent with acute small left cerebellar ischemic infarction. There was no evidence of basilar artery occlusion or significant stenosis. No thrombus is seen. Patient was not administer TPA initially. Patient was monitored LDL which was obtained was 90 and patient was placed on simvastatin. 2-D echo done showed no source of emboli. Carotid Dopplers had no significant ICA stenosis. Patient was seen by neurology and followed throughout the hospitalization. Patient was on aspirin 81 mg daily prior to admission and initially enrolled in the Socrates trial. However due to intolerance drug was discontinued and patient was placed on aspirin. Patient subsequently did have some significant left-sided hemiparesis. CT head which was done was negative for any acute infarct. MRI of the head was done which was consistent with a new right ECA infarction with concerns for embolic etiology. Patient was subsequently maintained on aspirin 325 mg daily. Cardiology consultation was obtained and patient  underwent a [name]. Patient s/pTEE and loop recorder. [name] negative for left atrial thrombus, negative bubble study, EF 65% with no  wall motion abnormalities. Extensive grade 4 bulky aortic debris and plaque mostly in the distal thoracic aorta but some near arch. Plaque may be a source of emboli. It was recommended per neurology that patient be changed to aspirin 81 mg daily as well as Plavix 75 mg daily for secondary stroke prevention for the next 3 months then on antiplatelet alone. CT of the chest was ordered per cardiology to rule out penetrating ulcer. CT of the chest was pending at the time of discharge. Patient will be discharged to inpatient rehabilitation and is to followup with Dr. Leonie Man of neurology 2 months post discharge. Patient discharged in stable condition.  Hypertension, poorly controlled  On admission due to acute CVA permissive hypertension was allowed. Patient was then subsequently started on a home regimen of clonidine, Norvasc. [name] labetalol was changed to atenolol per patient request. This was also placed on hydralazine which was titrated and patient is currently on hydralazine 75 mg 3 times daily. Patient may need further titration of antihypertensive medications will need to followup with PCP as outpatient. Impaired glucose tolerance  -Hemoglobin A1c 6.4  -Lifestyle modification  Prior renal artery duplex negative for renal artery stenosis-  CKD stage III  -Review of the records reveals large variation and baseline serum creatinine  -varies from 1.4-1.9. Followup as outpatient.  -Urinalysis is bland  Positive gastro-occult  -Hemoglobin has remained stable  -Continue to monitor  -Patient denies any abdominal pain, tolerating diet  History of tobacco use  -Patient quit one year ago  Anxiety disorder/fibromyalgia  -Continued on Klonopin 0.5 mg twice a day      Procedures: CT head 04/10/2013  MRI MRA of the head 04/08/2013  2-D echo 04/10/2013  Carotid Dopplers 04/10/2013  Chest x-ray 04/08/2013, 04/09/2013  MRI head 04/13/2013  [name] 04/16/2013  Implantable loop recorder 04/16/2013 CT  angio chest 04/17/2013  Consultations: Neurology: Dr. Nicole Kindred 04/08/2013  Cardiology: Dr. Crissie Sickles   Discharge Exam: Filed Vitals:   04/17/13 1457  BP: 168/62  Pulse: 67  Temp: 97.2 F (36.2 C)  Resp: 20    General: NAD Cardiovascular: RRR Respiratory: CTAB Neuro: Left upper extremity flaccid. Left lower extremity weakness 2/5. Left visual field defect.  Discharge Instructions   Future Appointments Provider Department Dept Phone   04/17/2013 3:35 PM [facility] [place]   Liquids only 4 hours prior to your exam. Any medications can be taken as usual. Please arrive 15 min prior to your scheduled exam time.   04/30/2013 10:00 AM [facility] [place] [phone]   05/01/2013 3:00 PM [facility] [address] [place] [facility] [address] [phone]   05/01/2013 4:00 PM [name], MD [facility] [phone]   05/21/2013 11:40 AM Satira Sark, MD [facility] [phone]       Medication List         amLODipine 10 MG tablet  Commonly known as:  NORVASC  Take 10 mg by mouth daily.     aspirin EC 81 MG tablet  Take 81 mg by mouth every morning.     atenolol 25 MG tablet  Commonly known as:  TENORMIN  Take 1 tablet (25 mg total) by mouth daily.     clonazePAM 0.5 MG tablet  Commonly known as:  KLONOPIN  Take 0.5 mg  by mouth 2 (two) times daily as needed for anxiety.     cloNIDine 0.3 mg/24hr patch  Commonly known as:  CATAPRES - Dosed in mg/24 hr  Place 0.3 mg onto the skin once a week. On Tuesday     hydrALAZINE 50 MG tablet  Commonly known as:  APRESOLINE  Take 1 tablet (50 mg total) by mouth 2 (two) times daily before a meal.     simvastatin 10 MG tablet  Commonly known as:  ZOCOR  Take 1 tablet (10 mg total) by mouth daily at 6 PM.     tolterodine 4 MG 24 hr capsule  Commonly known as:  DETROL LA  Take 1 capsule (4 mg total) by mouth  daily.       Allergies  Allergen Reactions  . Hydrochlorothiazide Nausea And Vomiting       Follow-up Information   Follow up with [facility] On 04/30/2013. (At 10:00 AM)    Specialty:  Cardiology   Contact information:   [address] [place] [address] [phone]      Follow up with Forbes Cellar, MD. Schedule an appointment as soon as possible for a visit in 2 months.   Specialties:  Neurology, Radiology   Contact information:   [address] [place] [address] [phone]       Please follow up. (keep follow up appointment for socrates trial)        The results of significant diagnostics from this hospitalization (including imaging, microbiology, ancillary and laboratory) are listed below for reference.    Significant Diagnostic Studies: Ct Head Wo Contrast  04/12/2013   CLINICAL DATA:  Lethargy and increased left-sided weakness.  EXAM: CT HEAD WITHOUT CONTRAST  TECHNIQUE: Contiguous axial images were obtained from the base of the skull through the vertex without intravenous contrast.  COMPARISON:  04/10/2013 head CT and 04/08/2013 brain MRI  FINDINGS: There is new hypoattenuation within the posteromedial right temporal lobe and right occipital lobe as well as right cerebral peduncle. Focus of hypoattenuation in the left cerebellum corresponds to acute infarct described on recent MRI. There is no evidence of intracranial hemorrhage. Patchy areas of hypoattenuation within the white matter of both cerebral hemispheres are compatible with moderate chronic small vessel ischemic disease and similar to the prior study. There is no evidence of mass or midline shift. No extra-axial fluid collection.  Mastoid air cells are clear. There is mild posterior left ethmoid air cell mucosal thickening. Prior bilateral cataract surgery is noted.  IMPRESSION: Findings concerning for acute right PCA infarct.  These results were called  by telephone at the time of interpretation on 04/12/2013 at 12:53 PM to Dr. Collier Salina, Janann Colonel , who verbally acknowledged these results.   Electronically Signed   By: Logan Bores   On: 04/12/2013 12:53   Ct Head Wo Contrast  04/10/2013   CLINICAL DATA:  Left upper and left lower extremity weakness  EXAM: CT HEAD WITHOUT CONTRAST  TECHNIQUE: Contiguous axial images were obtained from the base of the skull through the vertex without intravenous contrast. Study was obtained within 24 hr of patient's arrival at the emergency department.  COMPARISON:  Brain CT and brain MRI April 08, 2013  FINDINGS: There is mild diffuse atrophy, stable. There is no demonstrable mass, hemorrhage, extra-axial fluid collection, or midline shift. The known acute cerebellar infarct on the left is better defined on this study compared to recent prior studies. There is extensive  small vessel disease throughout the centra semiovale bilaterally. There is evidence of a prior small infarct in the medial left parietal lobe. There is no new infarct appreciable compared to 2 days prior.  Bony calvarium appears intact.  The mastoid air cells are clear.  IMPRESSION: The known recent left cerebellar infarct is better define currently than on studies obtained 2 days prior. There is extensive supratentorial small vessel disease as well as a prior small left parietal lobe infarct. No convincing new infarct is seen in these areas, although a small infarct could easily be obscured on CT by the degree of underlying small vessel disease. There is no acute hemorrhage or mass effect. The middle cerebral arteries do not show increased attenuation on either side.   Electronically Signed   By: Lowella Grip M.D.   On: 04/10/2013 08:42   Ct Head Wo Contrast  04/08/2013   CLINICAL DATA:  Bilateral arm numbness.  EXAM: CT HEAD WITHOUT CONTRAST  TECHNIQUE: Contiguous axial images were obtained from the base of the skull through the vertex without intravenous  contrast.  COMPARISON:  01/16/2013.  FINDINGS: No significant change in patchy white matter low density in both cerebral hemispheres and small areas of more focal low density. Stable old left basal ganglia lacunar infarct or prominent perivascular space. The ventricles remain normal in size and position. No intracranial hemorrhage, mass lesion or CT evidence of acute infarction. Unremarkable bones and included paranasal sinuses.  IMPRESSION: 1. No acute abnormality. 2. Stable chronic small vessel white matter ischemic changes and old lacunar infarcts. These results were called by telephone at the time of interpretation on 04/08/2013 at 3:29 PM to Dr. Ezequiel Essex , who verbally acknowledged these results.   Electronically Signed   By: Enrique Sack M.D.   On: 04/08/2013 15:31   Mr Jodene Nam Head Wo Contrast  04/08/2013   CLINICAL DATA:  Previous left carotid endarterectomy. Chronic right carotid occlusion. Stroke.  EXAM: MRI HEAD WITHOUT CONTRAST  MRA HEAD WITHOUT CONTRAST  TECHNIQUE: Multiplanar, multiecho pulse sequences of the brain and surrounding structures were obtained without intravenous contrast. Angiographic images of the head were obtained using MRA technique without contrast.  COMPARISON:  Head CT same day.  CT angiography an MRI brain 2014  FINDINGS: MRI HEAD FINDINGS  Diffusion imaging shows a sub cm acute infarction within the left cerebellum. No other acute infarction.  There chronic small-vessel ischemic changes within the pons. There are chronic small-vessel ischemic changes throughout the cerebral hemispheric white matter. There is old subcortical infarction in the right frontoparietal region and in the left parietal region. There is some hemosiderin deposition in those areas related to the old infarctions. No evidence of mass lesion, acute hemorrhage, hydrocephalus or extra-axial collection. No pituitary mass. No inflammatory sinus disease.  MRA HEAD FINDINGS  There is chronic right internal carotid  artery occlusion. The left internal carotid artery is widely patent. There is some atherosclerotic irregularity in the carotid siphon region. The left internal carotid arteries supplies the left anterior and middle cerebral artery territories and through a patent anterior communicating artery, the right anterior and middle cerebral artery territories. Distal branch vessels do show some atherosclerotic irregularity.  The right vertebral artery is a large vessel widely patent to the basilar. There is reconstituted flow in the left vertebral artery. No basilar stenosis. Posterior circulation branch vessels are patent. No flow is seen in the right posterior cerebral artery. This is a new finding compared to previous examinations. However, no infarction  is noted in the right PCA territory.  IMPRESSION: Sub cm acute infarction within the left cerebellum. No other acute infarction. Extensive old ischemic changes throughout the hemispheric white matter with watershed infarction pattern on the right.  Chronic right internal carotid artery occlusion. Anterior circulation supply occurs via a widely patent left internal carotid artery with patent anterior communicating artery.  Widely patent dominant right vertebral artery. Reconstituted flow in the left vertebral artery.  No flow demonstrated in the right posterior cerebral artery on today's study. This is new since the previous exam. However, no infarction is seen in the right PCA territory as might be expected.   Electronically Signed   By: Nelson Chimes M.D.   On: 04/08/2013 19:58   Mr Brain Wo Contrast  04/08/2013   CLINICAL DATA:  Previous left carotid endarterectomy. Chronic right carotid occlusion. Stroke.  EXAM: MRI HEAD WITHOUT CONTRAST  MRA HEAD WITHOUT CONTRAST  TECHNIQUE: Multiplanar, multiecho pulse sequences of the brain and surrounding structures were obtained without intravenous contrast. Angiographic images of the head were obtained using MRA technique  without contrast.  COMPARISON:  Head CT same day.  CT angiography an MRI brain 2014  FINDINGS: MRI HEAD FINDINGS  Diffusion imaging shows a sub cm acute infarction within the left cerebellum. No other acute infarction.  There chronic small-vessel ischemic changes within the pons. There are chronic small-vessel ischemic changes throughout the cerebral hemispheric white matter. There is old subcortical infarction in the right frontoparietal region and in the left parietal region. There is some hemosiderin deposition in those areas related to the old infarctions. No evidence of mass lesion, acute hemorrhage, hydrocephalus or extra-axial collection. No pituitary mass. No inflammatory sinus disease.  MRA HEAD FINDINGS  There is chronic right internal carotid artery occlusion. The left internal carotid artery is widely patent. There is some atherosclerotic irregularity in the carotid siphon region. The left internal carotid arteries supplies the left anterior and middle cerebral artery territories and through a patent anterior communicating artery, the right anterior and middle cerebral artery territories. Distal branch vessels do show some atherosclerotic irregularity.  The right vertebral artery is a large vessel widely patent to the basilar. There is reconstituted flow in the left vertebral artery. No basilar stenosis. Posterior circulation branch vessels are patent. No flow is seen in the right posterior cerebral artery. This is a new finding compared to previous examinations. However, no infarction is noted in the right PCA territory.  IMPRESSION: Sub cm acute infarction within the left cerebellum. No other acute infarction. Extensive old ischemic changes throughout the hemispheric white matter with watershed infarction pattern on the right.  Chronic right internal carotid artery occlusion. Anterior circulation supply occurs via a widely patent left internal carotid artery with patent anterior communicating artery.   Widely patent dominant right vertebral artery. Reconstituted flow in the left vertebral artery.  No flow demonstrated in the right posterior cerebral artery on today's study. This is new since the previous exam. However, no infarction is seen in the right PCA territory as might be expected.   Electronically Signed   By: Nelson Chimes M.D.   On: 04/08/2013 19:58   Dg Chest Port 1 View  04/08/2013   CLINICAL DATA:  Stroke.  Productive cough.  EXAM: PORTABLE CHEST - 1 VIEW  COMPARISON:  DG CHEST 2 VIEW dated 01/16/2013; DG CHEST 2 VIEW dated 10/01/2012  FINDINGS: Cardiopericardial silhouette within normal limits. Mediastinal contours normal. Trachea midline. No airspace disease or effusion. Basilar atelectasis is  present, prominent on the left but similar to prior. Monitoring leads project over the chest.  IMPRESSION: No acute cardiopulmonary disease.   Electronically Signed   By: Dereck Ligas M.D.   On: 04/08/2013 23:58   Mr Brain Ltd W/o Cm  04/13/2013   CLINICAL DATA:  Evaluate for new infarct.  EXAM: MRI HEAD WITHOUT CONTRAST  TECHNIQUE: Multi planar diffusion imaging of the brain and surrounding structures were obtained without intravenous contrast. Limited exam requested for clinical surface.  COMPARISON:  04/09/2011  FINDINGS: There is restricted diffusion in the entirety of the right PCA territory, including right occipital lobe, mesial and posterior right temporal lobe, mid brain and posterior right thalamus. There is no gross hemorrhage within the new infarct. In early subacute infarct in the central left cerebellum is again seen, slightly fuller in size, now 9 mm. No acute anterior circulation infarct is seen. There is remote bilateral border zone infarcts in this patient with chronic right ICA occlusion.  These results were called by telephone at the time of interpretation on 04/13/2013 at 12:59 PM to Dr. Mikey Bussing , who verbally acknowledged these results.  IMPRESSION: 1. New, acute right PCA  territory infarction. 2. Pre-existing left cerebellar infarct is mildly larger.   Electronically Signed   By: Jorje Guild M.D.   On: 04/13/2013 13:02    Microbiology: No results found for this or any previous visit (from the past 240 hour(s)).   Labs: Basic Metabolic Panel:  Recent Labs Lab 04/13/13 0545 04/14/13 0528 04/16/13 0627 04/16/13 1318 04/17/13 1210  NA 142 146 144 147 148*  K 3.3* 3.7 3.8 4.2 4.0  CL 106 109 107 108 109  CO2 24 24 24 24 24   GLUCOSE 144* 127* 126* 148* 125*  BUN 24* 27* 31* 32* 33*  CREATININE 1.49* 1.49* 1.57* 1.57* 1.45*  CALCIUM 9.0 9.0 9.1 9.3 9.1   Liver Function Tests: No results found for this basename: AST, ALT, ALKPHOS, BILITOT, PROT, ALBUMIN,  in the last 168 hours No results found for this basename: LIPASE, AMYLASE,  in the last 168 hours No results found for this basename: AMMONIA,  in the last 168 hours CBC:  Recent Labs Lab 04/13/13 0545 04/16/13 1318  WBC 11.2* 9.8  HGB 12.1 12.8  HCT 36.9 39.2  MCV 85.6 87.1  PLT 276 312   Cardiac Enzymes: No results found for this basename: CKTOTAL, CKMB, CKMBINDEX, TROPONINI,  in the last 168 hours BNP: BNP (last 3 results) No results found for this basename: PROBNP,  in the last 8760 hours CBG:  Recent Labs Lab 04/16/13 1156 04/16/13 1726 04/16/13 2058 04/17/13 0657 04/17/13 1150  GLUCAP 151* 113* 122* 121* 121*       Signed:  THOMPSON,DANIEL MD [facility] 04/17/2013, 3:33 PM

## 2013-04-17 NOTE — Progress Notes (Signed)
Rehab admissions - Received a call from Dr. Grandville Silos that pt is medically cleared for inpt rehab today and clearance also given per neuro per Dr. Grandville Silos.   Will admit pt later today to inpatient rehab. Thanks.  Nanetta Batty, PT Rehabilitation Admissions Coordinator [phone]

## 2013-04-17 NOTE — H&P (View-Only) (Signed)
Physical Medicine and Rehabilitation Admission H&P    Chief Complaint  Patient presents with  . Facial numbness, left sided weakness, right gaze preference, N/V   HPI:  Kelli [name] is a 63 y.o. 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  She was admitted on 04/08/2013 with dizziness, N/V as well as left-sided numbness and weakness. MRI of the brain showed acute infarct in the left cerebellum. MRA of the head with chronic right ICA artery occlusion and widely patent dominant right vertebral artery. Echocardiogram with ejection fraction of 60% no wall motion abnormalities.  Carotid Dopplers showed left CEA patent and right vertebral artery flow is antegrade. Patient did not receive TPA. Neurology services consulted and recommended ASA for thrombotic stroke due to SVD--hold off on trial. maintained on socrates trial,ticagrelor versus aspirin for stroke prevention. Patient with severe HA as well as waxing and waning of left sided weakness with lability.  MRI brain repeated 04/13/13 with new restricted diffusion entire R-PCA involving right occipital lobe, mesial and posterior right temporal lobe, mid brain and posterior right thalamus and slight increase in left cerebellar stroke. 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 Implantable loop recorder placed by Dr. Lovena Le yesterday. Patient with resultant dense Left hemiparesis, left inattention, left HH and dysphagia,     Review of Systems  HENT: Negative for hearing loss.   Eyes: Negative for blurred vision and double vision.  Respiratory: Negative for cough and shortness of breath.     Cardiovascular: Negative for chest pain and palpitations.  Gastrointestinal: Negative for heartburn, nausea and vomiting.  Genitourinary: Positive for urgency and frequency.  Musculoskeletal: Positive for joint pain (left shoulder pain) and myalgias.  Neurological: Positive for sensory change. Negative for dizziness and headaches.  Psychiatric/Behavioral: Positive for memory loss. The patient is nervous/anxious.     Past Medical History  Diagnosis Date  . Essential hypertension, benign   . Fibromyalgia   . Cerebral infarction Aug 2014    Bihemispheric watershed infarcts  . Mixed hyperlipidemia   . Borderline diabetes   . Urinary incontinence   . Carotid artery occlusion     Occluded RICA, status post left CEA  August 2014 - Dr. Donnetta Hutching  . CKD (chronic kidney disease) stage 3, GFR 30-59 ml/min   . 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 Urethral dilation  1980's  . Combined hysterectomy vaginal w/ mmk / a&p repair  1981  . Vaginal hysterectomy  1981    "partial" (10/04/2012)  . Endarterectomy Left 10/06/2012    Procedure: Carotid Endarterectomy with Finesse patch angioplasty;  Surgeon: [name], MD;  Location: [facility] OR;  Service: Vascular;  Laterality: Left;    Family History  Problem Relation Age of Onset  . Diabetes Mother   . Diabetes Father   . Hypertension Sister   . Diabetes Brother   . Seizures Son   . Kidney disease Maternal Grandmother   . Hypertension Maternal Grandmother   . Heart disease Maternal Grandfather   . Diabetes Paternal Grandmother   . Heart disease Paternal Grandfather  Social History: Married. 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 Per reports that she quit smoking about 6 months ago. Her smoking use included Cigarettes. She has a 44 pack-year smoking history. She has never used smokeless tobacco. She  reports that she does not drink alcohol or use illicit drugs.   Allergies  Allergen Reactions  . Hydrochlorothiazide Nausea And Vomiting   Medications Prior to Admission  Medication Sig Dispense Refill  . amLODipine (NORVASC) 10 MG tablet Take 10 mg by mouth daily.      Marland Kitchen aspirin EC 81 MG tablet Take 81 mg by mouth every morning.      Marland Kitchen atenolol (TENORMIN) 25 MG tablet Take 1 tablet (25 mg total) by mouth daily.  180 tablet  3  . clonazePAM (KLONOPIN) 0.5 MG tablet Take 0.5 mg by mouth 2 (two) times daily as needed for anxiety.      . cloNIDine (CATAPRES - DOSED IN MG/24 HR) 0.3 mg/24hr patch Place 0.3 mg onto the skin once a week. On Tuesday      . hydrALAZINE (APRESOLINE) 50 MG tablet Take 1 tablet (50 mg total) by mouth 2 (two) times daily before a meal.  270 tablet  3  . simvastatin (ZOCOR) 10 MG tablet Take 1 tablet (10 mg total) by mouth daily at 6 PM.  30 tablet  1  . tolterodine (DETROL LA) 4 MG 24 hr capsule Take 1 capsule (4 mg total) by mouth daily.  30 capsule  3    Home: Home Living Family/patient expects to be discharged to:: Private residence Living Arrangements: Spouse/significant other;Other relatives Available Help at Discharge: Family Type of Home: House Home Access: Stairs to enter [employer] of Steps: 3 Entrance Stairs-Rails: None Home Layout: Two level;Able to live on main level with bedroom/bathroom Alternate Level Stairs-Number of Steps: flight Alternate Level Stairs-Rails: Right Home Equipment: Shower seat  Lives With: Spouse   Functional History: Prior Function Comments: Engineer, manufacturing systems, gardening, art, nature.  Pt reports she does not drive  Functional Status:  Mobility:          ADL: ADL Eating/Feeding: Other (comment) (not attempted due to decreased arrousal) Grooming: Brushing hair;Moderate assistance Where Assessed - Grooming: Unsupported sitting Upper Body Bathing: Maximal assistance Where Assessed - Upper Body  Bathing: Supine, head of bed up Lower Body Bathing: +1 Total assistance Where Assessed - Lower Body Bathing: Supine, head of bed up;Rolling right and/or left Upper Body Dressing: +1 Total assistance Where Assessed - Upper Body Dressing: Supine, head of bed up Lower Body Dressing: +1 Total assistance Where Assessed - Lower Body Dressing: Supine, head of bed up;Rolling right and/or left Toilet Transfer: +1 Total assistance (did not attempt due to lethargy) ADL Comments: Pt moved to EOB.  Worked on trunk control and sitting balance.  Pt maintains head rotated to Rt.  - will turn to Lt with cues.  She was unable to locate clock on wall to her left with maximal cues, and unable to locate objects to left of midline.  She was unable to read numbers on dynamometer on her Rt. Pt unable to participate in formal visual testing as she is unable to maintain eye opening long enough to do so.  She scooted up EOB with max A  Cognition: Cognition Overall Cognitive Status: Impaired/Different from baseline Arousal/Alertness: Lethargic Orientation Level: Oriented X4 Attention: Sustained Sustained Attention: Impaired Sustained Attention Impairment: Verbal basic;Functional basic Memory: Impaired Memory Impairment: Decreased recall of new information Awareness:  Impaired Awareness Impairment: Intellectual impairment Problem Solving: Impaired Problem Solving Impairment: Verbal basic Safety/Judgment: Impaired Cognition Arousal/Alertness: Lethargic Behavior During Therapy: Flat affect Overall Cognitive Status: Impaired/Different from baseline Area of Impairment: Attention;Following commands;Problem solving Current Attention Level: Sustained Following Commands: Follows one step commands consistently Problem Solving: Slow processing;Decreased initiation;Difficulty sequencing;Requires verbal cues;Requires tactile cues General Comments: Better attention and stimulation to L side today. patient able to rub left arm  with washcloth and turn head ~ 60 to left  Physical Exam: Blood pressure 182/63, pulse 79, temperature 98.6 F (37 C), temperature source Oral, resp. rate 20, height 5' 5"  (1.651 m), weight 98.748 kg (217 lb 11.2 oz), SpO2 96.00%. Physical Exam  Neurological: She displays abnormal reflex.   Vitals reviewed.  HENT: keeps eyes closed unless cued. restless Head: Normocephalic.  Eyes: EOM are normal.  Neck: Normal range of motion. Neck supple. No thyromegaly present.  Cardiovascular: Normal rate and regular rhythm. No murmur Respiratory: Effort normal and breath sounds normal. No respiratory distress.  GI: Soft. Bowel sounds are normal. She exhibits no distension. Non-tender Neurological:  Patient was awake. She kept her eyes closed during exam. She was able to name place as well as her age. Follows simple commands. Left HH and   inattention also. 0/5 LUE and LLE on exam. (may have trace HE) Does sense pain to pinch in left arm and leg. Left facial droop and tongue deviation also noted. Speech dysarthric. DTR's 1+ throughout left side. Skin: Skin is warm and dry.  Psychiatric:  A little lethargic, somewhat anxious per baseline otherwise. Very cooperative.     Results for orders placed during the hospital encounter of 04/08/13 (from the past 48 hour(s))  GLUCOSE, CAPILLARY     Status: Abnormal   Collection Time    04/14/13  4:56 PM      Result Value Ref Range   Glucose-Capillary 130 (*) 70 - 99 mg/dL   Comment 1 Documented in Chart    GLUCOSE, CAPILLARY     Status: Abnormal   Collection Time    04/14/13  9:15 PM      Result Value Ref Range   Glucose-Capillary 111 (*) 70 - 99 mg/dL   Comment 1 Documented in Chart     Comment 2 Notify RN    GLUCOSE, CAPILLARY     Status: Abnormal   Collection Time    04/15/13  6:44 AM      Result Value Ref Range   Glucose-Capillary 153 (*) 70 - 99 mg/dL   Comment 1 Documented in Chart     Comment 2 Notify RN    GLUCOSE, CAPILLARY     Status:  Abnormal   Collection Time    04/15/13 11:53 AM      Result Value Ref Range   Glucose-Capillary 136 (*) 70 - 99 mg/dL   Comment 1 Documented in Chart    GLUCOSE, CAPILLARY     Status: Abnormal   Collection Time    04/15/13  4:37 PM      Result Value Ref Range   Glucose-Capillary 117 (*) 70 - 99 mg/dL   Comment 1 Documented in Chart    GLUCOSE, CAPILLARY     Status: None   Collection Time    04/15/13  9:02 PM      Result Value Ref Range   Glucose-Capillary 99  70 - 99 mg/dL  BASIC METABOLIC PANEL     Status: Abnormal   Collection Time    04/16/13  6:27 AM  Result Value Ref Range   Sodium 144  137 - 147 mEq/L   Potassium 3.8  3.7 - 5.3 mEq/L   Chloride 107  96 - 112 mEq/L   CO2 24  19 - 32 mEq/L   Glucose, Bld 126 (*) 70 - 99 mg/dL   BUN 31 (*) 6 - 23 mg/dL   Creatinine, Ser 1.57 (*) 0.50 - 1.10 mg/dL   Calcium 9.1  8.4 - 10.5 mg/dL   GFR calc non Af Amer 34 (*) >90 mL/min   GFR calc Af Amer 40 (*) >90 mL/min   Comment: (NOTE)     The eGFR has been calculated using the CKD EPI equation.     This calculation has not been validated in all clinical situations.     eGFR's persistently <90 mL/min signify possible Chronic Kidney     Disease.  GLUCOSE, CAPILLARY     Status: Abnormal   Collection Time    04/16/13  6:39 AM      Result Value Ref Range   Glucose-Capillary 127 (*) 70 - 99 mg/dL   Comment 1 Documented in Chart     Comment 2 Notify RN    GLUCOSE, CAPILLARY     Status: Abnormal   Collection Time    04/16/13 11:56 AM      Result Value Ref Range   Glucose-Capillary 151 (*) 70 - 99 mg/dL   No results found.  Post Admission Physician Evaluation: 1. Functional deficits secondary  to cerebellar and right PCA infarct (involving right occipital lobe and right temporal lobe, mid brain, right thalamus. 2. Patient is admitted to receive collaborative, interdisciplinary care between the physiatrist, rehab nursing staff, and therapy team. 3. [name] level of medical  complexity and substantial therapy needs in context of that medical necessity cannot be provided at a lesser intensity of care such as a SNF. 4. Patient has experienced substantial functional loss from his/her baseline which was documented above under the "Functional History" and "Functional Status" headings.  Judging by the patient's diagnosis, physical exam, and functional history, the patient has potential for functional progress which will result in measurable gains while on inpatient rehab.  These gains will be of substantial and practical use upon discharge  in facilitating mobility and self-care at the household level. 5. Physiatrist will provide 24 hour management of medical needs as well as oversight of the therapy plan/treatment and provide guidance as appropriate regarding the interaction of the two. 6. 24 hour rehab nursing will assist with bladder management, bowel management, safety, skin/wound care, disease management, medication administration, pain management and patient education  and help integrate therapy concepts, techniques,education, etc. 7. PT will assess and treat for/with: Lower extremity strength, range of motion, stamina, balance, functional mobility, safety, adaptive techniques and equipment, NMR, visual perceptual and cognitive perceptual awareness.   Goals are: min to mod assist. 8. OT will assess and treat for/with: ADL's, functional mobility, safety, upper extremity strength, adaptive techniques and equipment, NMR, vis perceptual and cog perceptual rx, ego support.   Goals are: min to mod assist. 9. SLP will assess and treat for/with: swallowing, cognition, communication.  Goals are: supervision to min assist. 10. Case Management and Social Worker will assess and treat for psychological issues and discharge planning. 11. Team conference will be held weekly to assess progress toward goals and to determine barriers to discharge. 12. Patient will receive at least 3 hours of  therapy per day at least 5 days per week. 13. ELOS: 20-28 days  14. Prognosis:  good   Medical Problem List and Plan: left cerebellar infarct with extension and new R-PCA infarct affecting right occipital lobe, mesial and posterior right temporal lobe, mid brain and posterior right thalamus  -currently on ECASA and plavix for seconary stroke prevention  1. DVT Prophylaxis/Anticoagulation: SCD's, sq heparin. Needs lower ext dopplers upon admit 2. Fibromyalgia/Lleft shoulder pain due to subluxation/ Pain Management:  Positioning as patient with LUE neglect. Will add voltaren gel and tylenol prn.  3. Anxiety disorder/ Mood:  Poor awareness with apathy noted. Will likely require activating medication. Await evaluations. LCSW to follow for support and evaluation.  4. Neuropsych: This patient is not capable of making decisions on her own behalf. 5. HTN: will monitor with bid checks. Continue catapress, atenolol, norvasc and hydralazine.  6.  Borderline diabetes: Hgb A1c-6.3/MFG-134. CM diet restrictions. SSI for elevated BS---most thickened liquids are sweetened.  7:  Dyslipidemia: Continue zocor 8.  CKD stage 3:  Will monitor with serial checks.  9. Dysphagia: continue D2,nectar liquids with aspiration precautions. IVF at bedtime to maintain adequate hydration.  Patient/family non-compliant--continue to educate on aspiration risk.   10. Constipation: Continue miralax and senna.  11. Hyperactive bladder: check PVRs, check UA/UCS.   Meredith Staggers, MD, [facility]    04/16/2013

## 2013-04-17 NOTE — Progress Notes (Signed)
Stroke Team Progress Note  HISTORY Kelli [name] is a 63 y.o. female with a history of hypertension, hyperlipidemia, watershed cerebral infarctions, right ICA occlusion and left CEA, presenting 04/08/2013 with new onset vertigo with nausea and vomiting as well as numbness involving left side of face and left upper extremity and left upper extremity weakness. Onset of symptoms was at 2:30 PM  04/08/2013. Patient had been taking aspirin 81 mg per day. She presented to the emergency room at [facility] initially. She was initially deemed a candidate for TPA. However, she became worse with increasing nausea and vomiting as well as vertigo and worsening of numbness and left-sided weakness. Speech also became somewhat slurred. CTA was recommended by Specialist on-Call neurologist. Study could not be done however because of patient's elevated creatinine. She was subsequently transferred to [facility] for MRI and MRA study to rule out basal artery thrombosis. MRI showed findings consistent with acute small left cerebellar ischemic infarction. There was no evidence of basilar artery occlusion or significant stenosis. No evidence of thrombus was seen. NIH stroke score on arrival to [facility] was 5. Patient had dark colored emesis which was guaiac positive. Patient was not administerd TPA.  SUBJECTIVE Patient without complaints. Understand need for ongoing study follow up.  OBJECTIVE Most recent Vital Signs: Filed Vitals:   04/16/13 2102 04/17/13 0057 04/17/13 0641 04/17/13 1038  BP: 209/74 189/72 137/68 177/41  Pulse: 76 76  58  Temp: 98.5 F (36.9 C) 97.8 F (36.6 C) 98.1 F (36.7 C) 98 F (36.7 C)  TempSrc: Oral Axillary Oral Oral  Resp: 18 20 18 20   Height:      Weight:      SpO2: 95% 96% 95% 97%   CBG (last 3)   Recent Labs  04/16/13 2058 04/17/13 0657 04/17/13 1150  GLUCAP 122* 121* 121*    IV Fluid Intake:      MEDICATIONS  . amLODipine  10 mg Oral Daily  . antiseptic oral rinse  15 mL  Mouth Rinse BID  . aspirin  325 mg Oral Daily  . atenolol  25 mg Oral BID  . cloNIDine  0.3 mg Transdermal Q Tue  . fesoterodine  8 mg Oral Daily  . heparin subcutaneous  5,000 Units Subcutaneous 3 times per day  . hydrALAZINE  75 mg Oral 3 times per day  . insulin aspart  0-5 Units Subcutaneous QHS  . insulin aspart  0-9 Units Subcutaneous TID WC  . polyethylene glycol  17 g Oral BID  . senna-docusate  1 tablet Oral QHS  . simvastatin  10 mg Oral q1800   PRN:  acetaminophen, clonazePAM, hydrALAZINE, magnesium citrate, ondansetron (ZOFRAN) IV, prochlorperazine, RESOURCE THICKENUP CLEAR, sodium chloride  Diet:  Dysphagia 3 thin liquids Activity:  As tolerated DVT Prophylaxis:  Lovenox 40 mg sq daily   CLINICALLY SIGNIFICANT STUDIES Basic Metabolic Panel:   Recent Labs Lab 04/16/13 0627 04/16/13 1318  NA 144 147  K 3.8 4.2  CL 107 108  CO2 24 24  GLUCOSE 126* 148*  BUN 31* 32*  CREATININE 1.57* 1.57*  CALCIUM 9.1 9.3   Liver Function Tests:  No results found for this basename: AST, ALT, ALKPHOS, BILITOT, PROT, ALBUMIN,  in the last 168 hours CBC:   Recent Labs Lab 04/13/13 0545 04/16/13 1318  WBC 11.2* 9.8  HGB 12.1 12.8  HCT 36.9 39.2  MCV 85.6 87.1  PLT 276 312   Coagulation:  No results found for this basename: LABPROT, INR,  in the last 168 hours Cardiac Enzymes:  No results found for this basename: CKTOTAL, CKMB, CKMBINDEX, TROPONINI,  in the last 168 hours Urinalysis:  No results found for this basename: COLORURINE, APPERANCEUR, LABSPEC, PHURINE, GLUCOSEU, HGBUR, BILIRUBINUR, KETONESUR, PROTEINUR, UROBILINOGEN, NITRITE, LEUKOCYTESUR,  in the last 168 hours Lipid Panel    Component Value Date/Time   CHOL 171 04/09/2013 0725   TRIG 241* 04/09/2013 0725   HDL 33* 04/09/2013 0725   CHOLHDL 5.2 04/09/2013 0725   VLDL 48* 04/09/2013 0725   LDLCALC 90 04/09/2013 0725   HgbA1C  Lab Results  Component Value Date   HGBA1C 6.4* 04/09/2013    Urine Drug Screen:      Component Value Date/Time   LABOPIA NONE DETECTED 04/08/2013 1624   COCAINSCRNUR NONE DETECTED 04/08/2013 1624   LABBENZ NONE DETECTED 04/08/2013 1624   AMPHETMU NONE DETECTED 04/08/2013 1624   THCU NONE DETECTED 04/08/2013 1624   LABBARB NONE DETECTED 04/08/2013 1624    Alcohol Level:  No results found for this basename: ETH,  in the last 168 hours  CT of the brain   04/10/2013 The known recent left cerebellar infarct is better define currently than on studies obtained 2 days prior. There is extensive  supratentorial small vessel disease as well as a prior small left parietal lobe infarct. No convincing new infarct is seen in these areas, although a small infarct could easily be obscured on CT by the degree of underlying small vessel disease. There is no acute hemorrhage or mass effect. The middle cerebral arteries do not show increased attenuation on either side. 04/08/2013   1. No acute abnormality. 2. Stable chronic small vessel white matter ischemic changes and old lacunar infarcts.   MRI of the brain   04/12/2013 (limited) 1. New, acute right PCA territory infarction. 2. Pre-existing left cerebellar infarct is mildly larger. 04/08/2013  Sub cm acute infarction within the left cerebellum. No other acute infarction. Extensive old ischemic changes throughout the hemispheric white matter with watershed infarction pattern on the right.    MRA of the brain  04/08/2013  Chronic right internal carotid artery occlusion. Anterior circulation supply occurs via a widely patent left internal carotid artery with patent anterior communicating artery.  Widely patent dominant right vertebral artery. Reconstituted flow in the left vertebral artery.  No flow demonstrated in the right posterior cerebral artery on today's study. This is new since the previous exam. However, no infarction is seen in the right PCA territory as might be expected.    2D Echocardiogram  EF 55-60% with no source of embolus.   Carotid Doppler   Left: CEA patent. 1-39% ICA stenosis. Right: Vertebral artery flow is antegrade. Left: Vertebral artery flow is retrograde.   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 May be source of embolus and would Be Rx with dual antiplatelet Rx  CXR   04/09/2013 No acute cardiopulmonary disease. 04/08/2013  No acute cardiopulmonary disease.     EKG  normal sinus rhythm. For complete results please see formal report.   Therapy Recommendations - inpatient rehabilitation recommended.   Physical Exam   Middle aged 78 lady  In no distress  Afebrile. Head is nontraumatic. Neck is supple without bruit. Hearing is normal. Cardiac exam no murmur or gallop. Lungs are clear to auscultation. Distal pulses are well felt. She is sleeping and appears to have witnessed apnea suggestive of sleep apnea. Neurological Exam : She is quite drowsy today. She comes her eyes momentarily to mild sternal rub. She seemed to have apraxia of eyelid opening but a follow commands seen to arise are closed.Dense left homonymous hemianopsia. Mild dysarthria no aphasia. PERRL, EOMI, decreased blink to threat on the left,  no nystagmus. Mild left lower facial asymmetry. Tongue midline. Motor system exam reveals marked LUE 2/5 and now LLE weakness  0/5 Sensation is preserved. Gait was not tested. Left upgoing toe.   ASSESSMENT Ms. Kelli [name] is a 63 y.o. female presenting with left sided numbness and weakness, progressing to nausea and vomiting. Imaging confirms a left cerebellar infarct. Infarct felt to be thrombotic secondary to small vessel disease.  Patient then developed a new right PCA territory infarct. Etiology now felt to be  embolic. [name] with possible arch plaque; loop placed. On aspirin 81 mg orally every day prior to admission.  enrolled in [facility], ticagrelor vs aspirin early in hospital stay for secondary stroke prevention. Due to intolerance (rash), study drug was discontinued and pt was placed on aspirin alone. Given ulcerated aortic plaque, was changed to aspirin and plavix. Patient with resultant mild LUE weakness, nausea and vomiting (improving). IP rehab recommended.   Hypertension - recently stopped labetalol and switched to atenolol Hyperlipidemia, LDL 126, on no statin PTA, now on statin, goal LDL < 100 - Now on Zocor. Chronic kidney disease, stage 3 - follow up with nephrology planned in March. Holding off ARB or ACE Depression - hx of. Dr. Domenic Polite concerned it may need further management Fibromyalgia  Cigarette smoker  Obesity, Body mass index is 36.23 kg/(m^2).  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 S/p L CEA on 10/03/2012 by Dr. Donnetta Hutching. No hx heart failure - ejection fraction 55-60% by recent echo Suspected Obstructive sleep apnea - needs formal dx, consider OP sleep study Headache   Hospital day # 9  TREATMENT/PLAN  Change aspirin to aspirin 81 mg orally every day and clopidogrel 75 mg orally every day for secondary stroke prevention x 3 months then one alone. She will be continued to be followed by [facility] study even though she is no longer taking the study drug  Agree with plans for inpatient rehabilitation at discharge  OP sleep study eval recommended after discharge from rehab  No further stroke workup indicated. Patient has a 10-15% risk of having another stroke over the next year, the highest risk is within 2 weeks of the most recent stroke/TIA (risk of having a stroke following a stroke or TIA is the same). Ongoing risk factor control by Primary Care Physician Stroke  Service will sign off. Please call should any needs arise. Follow up with Dr. Leonie Man, [facility], in 2 months.   Burnetta Sabin, MSN, RN, ANVP-BC, ANP-BC, [facility] Pager: [phone] 04/17/2013 12:16 PM  I have personally obtained a history, examined the patient, evaluated imaging results, and formulated the assessment and plan of care. I agree with the above. Antony Contras, MD

## 2013-04-17 NOTE — Progress Notes (Signed)
Physical Therapy Treatment Patient Details Name: Kelli [name] MRN: [id number] DOB: [birth year] Today's Date: 04/17/2013 Time: [id number] PT Time Calculation (min): 31 min  PT Assessment / Plan / Recommendation  History of Present Illness Kelli [name] is an 63 y.o. female a history of hypertension, hyperlipidemia, watershed cerebral infarctions, right ICA occlusion and left CEA, presenting with new onset vertigo with nausea and vomiting as well as numbness involving left side of face and left upper extremity and left upper extremity weakness. MRI showed findings consistent with acute small left cerebellar ischemic infarction   PT Comments   Patient progressing today with overall mobility. Patient more alert and able to carry on conversation throughout session. Stood x3 for Dentist. Did not transfer to chair as patient planning to DC to CIR this afternoon  Follow Up Recommendations  CIR     Does the patient have the potential to tolerate intense rehabilitation     Barriers to Discharge        Equipment Recommendations       Recommendations for Other Services    Frequency Min 4X/week   Progress towards PT Goals Progress towards PT goals: Progressing toward goals  Plan Current plan remains appropriate    Precautions / Restrictions Precautions Precautions: Fall   Pertinent Vitals/Pain no apparent distress     Mobility  Bed Mobility Overal bed mobility: Needs Assistance Supine to sit: Mod assist;HOB elevated;+2 for physical assistance Sit to supine: Mod assist;+2 for physical assistance General bed mobility comments: A for L LE and truncal support and control . Cues for positioning throughout Transfers Overall transfer level: Needs assistance Equipment used: 2 person hand held assist Sit to Stand: Mod assist;+2 physical assistance General transfer comment: pt needs cueing and facilitation for anterior wt shift with coming to stand, blocking L knee and trunk/hip  extension coming to stand.  Able to stand x3 this session with better initiation each stand    Exercises     PT Diagnosis:    PT Problem List:   PT Treatment Interventions:     PT Goals (current goals can now be found in the care plan section)    Visit Information  Last PT Received On: 04/17/13 Assistance Needed: +2 History of Present Illness: Kelli [name] is an 63 y.o. female a history of hypertension, hyperlipidemia, watershed cerebral infarctions, right ICA occlusion and left CEA, presenting with new onset vertigo with nausea and vomiting as well as numbness involving left side of face and left upper extremity and left upper extremity weakness. MRI showed findings consistent with acute small left cerebellar ischemic infarction    Subjective Data      Cognition  Cognition Arousal/Alertness: Awake/alert Behavior During Therapy: WFL for tasks assessed/performed Area of Impairment: Memory Current Attention Level: Sustained Memory: Decreased recall of precautions;Decreased short-term memory Following Commands: Follows one step commands consistently Problem Solving: Slow processing;Decreased initiation;Difficulty sequencing;Requires verbal cues;Requires tactile cues    Balance  Balance Sitting balance-Leahy Scale: Fair Sitting balance - Comments: work on dynamic sitting balance x15 mins EOB.  Standing balance-Leahy Scale: Zero  End of Session PT - End of Session Activity Tolerance: Patient tolerated treatment well Patient left: with call bell/phone within reach;in bed Nurse Communication: Mobility status   GP     Jacqualyn Posey 04/17/2013, 2:42 PM 04/17/2013 Jacqualyn Posey PTA [phone] pager [phone] office

## 2013-04-17 NOTE — Interval H&P Note (Signed)
PRECILLA ASENCIO was admitted today to Inpatient Rehabilitation with the diagnosis of right PCA infarcts, left cerebellar infarct.  The patient's history has been reviewed, patient examined, and there is no change in status.  Patient continues to be appropriate for intensive inpatient rehabilitation.  I have reviewed the patient's chart and labs.  Questions were answered to the patient's satisfaction.  SWARTZ,ZACHARY T 04/17/2013, 6:15 PM

## 2013-04-18 ENCOUNTER — Inpatient Hospital Stay (HOSPITAL_COMMUNITY): Payer: 59 | Admitting: Physical Therapy

## 2013-04-18 ENCOUNTER — Inpatient Hospital Stay (HOSPITAL_COMMUNITY): Payer: 59

## 2013-04-18 LAB — URINALYSIS, ROUTINE W REFLEX MICROSCOPIC
BILIRUBIN URINE: NEGATIVE
Glucose, UA: NEGATIVE mg/dL
Hgb urine dipstick: NEGATIVE
Ketones, ur: NEGATIVE mg/dL
Leukocytes, UA: NEGATIVE
NITRITE: NEGATIVE
PH: 5.5 (ref 5.0–8.0)
Protein, ur: 100 mg/dL — AB
Specific Gravity, Urine: 1.031 — ABNORMAL HIGH (ref 1.005–1.030)
Urobilinogen, UA: 0.2 mg/dL (ref 0.0–1.0)

## 2013-04-18 LAB — COMPREHENSIVE METABOLIC PANEL
ALK PHOS: 72 U/L (ref 39–117)
ALT: 14 U/L (ref 0–35)
AST: 12 U/L (ref 0–37)
Albumin: 3.2 g/dL — ABNORMAL LOW (ref 3.5–5.2)
BUN: 33 mg/dL — AB (ref 6–23)
CHLORIDE: 110 meq/L (ref 96–112)
CO2: 22 meq/L (ref 19–32)
Calcium: 9.1 mg/dL (ref 8.4–10.5)
Creatinine, Ser: 1.76 mg/dL — ABNORMAL HIGH (ref 0.50–1.10)
GFR calc Af Amer: 35 mL/min — ABNORMAL LOW (ref >90)
GFR, EST NON AFRICAN AMERICAN: 30 mL/min — AB (ref >90)
GLUCOSE: 126 mg/dL — AB (ref 70–99)
POTASSIUM: 3.6 meq/L — AB (ref 3.7–5.3)
SODIUM: 145 meq/L (ref 137–147)
TOTAL PROTEIN: 7 g/dL (ref 6.0–8.3)
Total Bilirubin: 0.3 mg/dL (ref 0.3–1.2)

## 2013-04-18 LAB — GLUCOSE, CAPILLARY
GLUCOSE-CAPILLARY: 116 mg/dL — AB (ref 70–99)
Glucose-Capillary: 123 mg/dL — ABNORMAL HIGH (ref 70–99)
Glucose-Capillary: 110 mg/dL — ABNORMAL HIGH (ref 70–99)

## 2013-04-18 LAB — CBC WITH DIFFERENTIAL/PLATELET
Basophils Absolute: 0 10*3/uL (ref 0.0–0.1)
Basophils Relative: 0 % (ref 0–1)
EOS ABS: 0.2 10*3/uL (ref 0.0–0.7)
Eosinophils Relative: 2 % (ref 0–5)
HCT: 37 % (ref 36.0–46.0)
HEMOGLOBIN: 12 g/dL (ref 12.0–15.0)
LYMPHS ABS: 2.3 10*3/uL (ref 0.7–4.0)
Lymphocytes Relative: 27 % (ref 12–46)
MCH: 28.2 pg (ref 26.0–34.0)
MCHC: 32.4 g/dL (ref 30.0–36.0)
MCV: 86.9 fL (ref 78.0–100.0)
MONOS PCT: 11 % (ref 3–12)
Monocytes Absolute: 0.9 10*3/uL (ref 0.1–1.0)
NEUTROS ABS: 5.1 10*3/uL (ref 1.7–7.7)
Neutrophils Relative %: 60 % (ref 43–77)
PLATELETS: 279 10*3/uL (ref 150–400)
RBC: 4.26 MIL/uL (ref 3.87–5.11)
RDW: 13.9 % (ref 11.5–15.5)
WBC: 8.5 10*3/uL (ref 4.0–10.5)

## 2013-04-18 LAB — URINE MICROSCOPIC-ADD ON

## 2013-04-18 MED ORDER — METHYLPHENIDATE HCL 5 MG PO TABS
5.0000 mg | ORAL_TABLET | Freq: Two times a day (BID) | ORAL | Status: DC
  Administered 2013-04-19 (×2): 5 mg via ORAL
  Filled 2013-04-18 (×2): qty 1

## 2013-04-18 NOTE — Progress Notes (Signed)
Patient information reviewed and entered into eRehab system by Adriel Desrosier, RN, CRRN, PPS Coordinator.  Information including medical coding and functional independence measure will be reviewed and updated through discharge.    

## 2013-04-18 NOTE — Progress Notes (Signed)
Patient with frequency as well as retention. Will discontinue toviaz. Await [facility].  Monitor renal status.

## 2013-04-18 NOTE — Progress Notes (Signed)
Subjective/Complaints: 63 y.o. 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 She was admitted on 04/08/2013 with dizziness, N/V as well as left-sided numbness and weakness. MRI of the brain showed acute infarct in the left cerebellum. MRA of the head with chronic right ICA artery occlusion and widely patent dominant right vertebral artery. Echocardiogram with ejection fraction of 60% no wall motion abnormalities. Carotid Dopplers showed left CEA patent and right vertebral artery flow is antegrade. Patient did not receive TPA. Neurology services consulted and recommended ASA for thrombotic stroke due to SVD--hold off on trial. maintained on socrates trial,ticagrelor versus aspirin for stroke prevention. Patient with severe HA as well as waxing and waning of left sided weakness with lability. MRI brain repeated 04/13/13 with new restricted diffusion entire R-PCA involving right occipital lobe, mesial and posterior right temporal lobe, mid brain and posterior right thalamus and slight increase in left cerebellar stroke. [name] done negative for LAA thrombus but extensive grade 4 bulky aortic debris and plaque in distal aorta that could be source of embolus--CT chest recommended to rule out penetrating ulcer-negative as below Implantable loop recorder placed by Dr. Lovena Le   Pt somnolent but opens eyes to command , aware of 2 strokes as well as inability to move Left side but states her eye problem is related to surgery Thought I was her cardiologist Objective: Vital Signs: Blood pressure 120/53, pulse 67, temperature 98.9 F (37.2 C), temperature source Oral, resp. rate 17, SpO2 97.00%. Ct Angio Chest Aortic Dissect W &/or W/o  04/17/2013   CLINICAL DATA:  Extensive atheromatous plaque in the thoracic aorta. Evaluate for potential penetrating aortic ulcer.   EXAM: CT ANGIOGRAPHY CHEST WITH CONTRAST  TECHNIQUE: Multidetector CT imaging of the chest was performed using the standard protocol during bolus administration of intravenous contrast. Multiplanar CT image reconstructions and MIPs were obtained to evaluate the vascular anatomy.  CONTRAST:  79m OMNIPAQUE IOHEXOL 350 MG/ML SOLN  COMPARISON:  No priors.  FINDINGS: Mediastinum: Precontrast images demonstrate no crescentic high attenuation associated with the wall of the thoracic aorta to suggest acute intramural hemorrhage at this time. Post-contrast images demonstrate extensive atheromatous plaque throughout the distal aortic arch and descending thoracic aorta. Some of these aortic plaques are ulcerated, best demonstrated on image 81 of series 5 in the distal descending thoracic aorta, but there is no frank penetrating aortic ulcer or signs of dissection at this time. Atheromatous plaque in the great vessels of the mediastinum is also noted, including either high-grade stenosis or complete occlusion of the proximal left subclavian artery. Flow on the distal left subclavian artery is noted, either related to incomplete obstruction, or collateral pathways. Heart size is borderline enlarged. There is no significant pericardial fluid, thickening or pericardial calcification. Calcifications of the mitral annulus. Small hiatal hernia. No pathologically enlarged mediastinal or hilar lymph nodes.  Lungs/Pleura: Linear opacities in the left lower lobe likely reflect chronic scarring. No acute consolidative airspace disease. No pleural effusions. No definite suspicious appearing pulmonary nodules or masses are identified.  Upper Abdomen: Unremarkable.  Musculoskeletal: There are no aggressive appearing lytic or blastic lesions noted in the visualized portions of the skeleton.  Review of the MIP images confirms the above findings.  IMPRESSION: 1. Extensive atheromatous plaque in the thoracic aorta and great vessels of the  mediastinum. Although there are several ulcerated atheromatous plaques, there is no evidence of frank penetrating  aortic ulcer or thoracic aortic dissection at this time. 2. There is, however, either high-grade stenosis or complete occlusion of the proximal left subclavian artery, with distal flow in the left subclavian artery related to either incomplete occlusion or collateralization. 3. Small hiatal hernia.   Electronically Signed   By: Vinnie Langton M.D.   On: 04/17/2013 16:32   Results for orders placed during the hospital encounter of 04/17/13 (from the past 72 hour(s))  GLUCOSE, CAPILLARY     Status: None   Collection Time    04/17/13  8:50 PM      Result Value Ref Range   Glucose-Capillary 89  70 - 99 mg/dL   Comment 1 Notify RN    CBC WITH DIFFERENTIAL     Status: None   Collection Time    04/18/13  6:48 AM      Result Value Ref Range   WBC 8.5  4.0 - 10.5 K/uL   RBC 4.26  3.87 - 5.11 MIL/uL   Hemoglobin 12.0  12.0 - 15.0 g/dL   HCT 37.0  36.0 - 46.0 %   MCV 86.9  78.0 - 100.0 fL   MCH 28.2  26.0 - 34.0 pg   MCHC 32.4  30.0 - 36.0 g/dL   RDW 13.9  11.5 - 15.5 %   Platelets 279  150 - 400 K/uL   Neutrophils Relative % 60  43 - 77 %   Neutro Abs 5.1  1.7 - 7.7 K/uL   Lymphocytes Relative 27  12 - 46 %   Lymphs Abs 2.3  0.7 - 4.0 K/uL   Monocytes Relative 11  3 - 12 %   Monocytes Absolute 0.9  0.1 - 1.0 K/uL   Eosinophils Relative 2  0 - 5 %   Eosinophils Absolute 0.2  0.0 - 0.7 K/uL   Basophils Relative 0  0 - 1 %   Basophils Absolute 0.0  0.0 - 0.1 K/uL  COMPREHENSIVE METABOLIC PANEL     Status: Abnormal   Collection Time    04/18/13  6:48 AM      Result Value Ref Range   Sodium 145  137 - 147 mEq/L   Potassium 3.6 (*) 3.7 - 5.3 mEq/L   Chloride 110  96 - 112 mEq/L   CO2 22  19 - 32 mEq/L   Glucose, Bld 126 (*) 70 - 99 mg/dL   BUN 33 (*) 6 - 23 mg/dL   Creatinine, Ser 1.76 (*) 0.50 - 1.10 mg/dL   Calcium 9.1  8.4 - 10.5 mg/dL   Total Protein 7.0  6.0 - 8.3 g/dL    Albumin 3.2 (*) 3.5 - 5.2 g/dL   AST 12  0 - 37 U/L   ALT 14  0 - 35 U/L   Alkaline Phosphatase 72  39 - 117 U/L   Total Bilirubin 0.3  0.3 - 1.2 mg/dL   GFR calc non Af Amer 30 (*) >90 mL/min   GFR calc Af Amer 35 (*) >90 mL/min   Comment: (NOTE)     The eGFR has been calculated using the CKD EPI equation.     This calculation has not been validated in all clinical situations.     eGFR's persistently <90 mL/min signify possible Chronic Kidney     Disease.  GLUCOSE, CAPILLARY     Status: Abnormal   Collection Time    04/18/13  7:26 AM      Result Value Ref Range   Glucose-Capillary 116 (*)  70 - 99 mg/dL   Comment 1 Notify RN        General: No acute distress Mood and affect are appropriate Heart: Regular rate and rhythm no rubs murmurs or extra sounds Lungs: Clear to auscultation, breathing unlabored, no rales or wheezes Abdomen: Positive bowel sounds, soft nontender to palpation, nondistended Extremities: No clubbing, cyanosis, or edema Skin: No evidence of breakdown, no evidence of rash Neurologic: Cranial nerves II through XII intact, motor strength is 5/5 in Right deltoid, bicep, tricep, grip, hip flexor, knee extensors, ankle dorsiflexor and plantar flexor 0/5 on left side Sensory exam normal sensation to light touch and proprioception in right upper and lower extremities, feels deep pressure on left side Cerebellar exam cannot assess on left secondary to weakness Musculoskeletal: Full range of motion in Right upper and lower extremities. No joint swelling   Assessment/Plan: 1. Functional deficits secondary to Right PCA infarct with Left HP, L HH which require 3+ hours per day of interdisciplinary therapy in a comprehensive inpatient rehab setting. Physiatrist is providing close team supervision and 24 hour management of active medical problems listed below. Physiatrist and rehab team continue to assess barriers to discharge/monitor patient progress toward functional and  medical goals. FIM:                      Expression Expression Mode: Verbal Expression: 4-Expresses basic 75 - 89% of the time/requires cueing 10 - 24% of the time. Needs helper to occlude trach/needs to repeat words.  Social Interaction Social Interaction: 5-Interacts appropriately 90% of the time - Needs monitoring or encouragement for participation or interaction.        Medical Problem List and Plan:  left cerebellar infarct with extension and new R-PCA infarct affecting right occipital lobe, mesial and posterior right temporal lobe, mid brain and posterior right thalamus  -currently on ECASA and plavix for seconary stroke prevention  1. DVT Prophylaxis/Anticoagulation: SCD's, sq heparin. Needs lower ext dopplers upon admit  2. Fibromyalgia/Lleft shoulder pain due to subluxation/ Pain Management: Positioning as patient with LUE neglect. Will add voltaren gel and tylenol prn.  3. Anxiety disorder/ Mood: Poor awareness with apathy noted. Trial ritalin LCSW to follow for support and evaluation.  4. Neuropsych: This patient is not capable of making decisions on her own behalf.  5. HTN: will monitor with bid checks. Continue catapress, atenolol, norvasc and hydralazine.  6. Borderline diabetes: Hgb A1c-6.3/MFG-134. CM diet restrictions. SSI for elevated BS---most thickened liquids are sweetened.  7: Dyslipidemia: Continue zocor  8. CKD stage 3: Will monitor with serial checks.  9. Dysphagia: continue D2,nectar liquids with aspiration precautions. IVF at bedtime to maintain adequate hydration. Patient/family non-compliant--continue to educate on aspiration risk.  10. Constipation: Continue miralax and senna.  11. Hyperactive bladder: check PVRs, check UA/UCS.    LOS (Days) 1 A FACE TO FACE EVALUATION WAS PERFORMED  Hillard Goodwine E 04/18/2013, 8:57 AM

## 2013-04-18 NOTE — Evaluation (Signed)
Occupational Therapy Assessment and Plan  Patient Details  Name: Kelli [name] MRN: [id number] Date of Birth: 1950/12/19  OT Diagnosis: cognitive deficits, disturbance of vision, flaccid hemiplegia and hemiparesis, hemiplegia affecting non-dominant side and muscle weakness (generalized) Rehab Potential: Rehab Potential: Good ELOS: 22-28 days   Today's Date: 04/18/2013 Time:0730-0830  Time Calculation (min): 60 min  Problem List:  Patient Active Problem List   Diagnosis Date Noted  . Acute CVA (cerebrovascular accident): R PCA infarct per MRI 04/13/13 04/13/2013  . CVA (cerebral infarction) 04/08/2013  . Acute ischemic stroke 04/08/2013  . Diplopia 04/08/2013  . Left-sided weakness 04/08/2013  . Vertigo 04/08/2013  . Paresthesias in left hand 04/08/2013  . Pre-diabetes 04/08/2013  . Cerebellar stroke 04/08/2013  . Dizziness and giddiness 04/08/2013  . Stroke 04/08/2013  . Depression 04/06/2013  . Peripheral arterial disease 03/16/2013  . History of stroke 01/30/2013  . Precordial pain 01/16/2013  . CKD (chronic kidney disease) stage 3, GFR 30-59 ml/min 01/16/2013  . Anxiety state, unspecified 12/08/2012  . Lumbago 12/08/2012  . Urinary frequency 11/14/2012  . Occlusion and stenosis of carotid artery with cerebral infarction 10/05/2012  . Mixed hyperlipidemia 10/05/2012  . Renal insufficiency 10/05/2012  . Hypertension, uncontrolled 10/04/2012  . Tobacco abuse 10/04/2012  . Obesity 10/04/2012    Past Medical History:  Past Medical History  Diagnosis Date  . Essential hypertension, benign   . Fibromyalgia   . Cerebral infarction Aug 2014    Bihemispheric watershed infarcts  . Mixed hyperlipidemia   . Borderline diabetes   . Urinary incontinence   . Carotid artery occlusion     Occluded RICA, status post left CEA  August 2014 - Dr. Donnetta Hutching  . CKD (chronic kidney disease) stage 3, GFR 30-59 ml/min   . Cerebral infarction involving left cerebellar artery Feb 2015    due  to small vessel disease   Past Surgical History:  Past Surgical History  Procedure Laterality Date  . Urethral dilation  1980's  . Combined hysterectomy vaginal w/ mmk / a&p repair  1981  . Vaginal hysterectomy  1981    "partial" (10/04/2012)  . Endarterectomy Left 10/06/2012    Procedure: Carotid Endarterectomy with Finesse patch angioplasty;  Surgeon: [name], MD;  Location: [facility];  Service: Vascular;  Laterality: Left;  . Loop recorder implant  04/16/13    MDT LinQ implanted for cryptogenic stroke  . [name] without cardioversion N/A 04/16/2013    Procedure: TRANSESOPHAGEAL ECHOCARDIOGRAM ([name]);  Surgeon: Josue Hector, MD;  [facility] END[facility]COPY;  Service: Cardiovascular;  Laterality: N/A;    Assessment & Plan Clinical Impression: Patient is a 63 y.o. 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 She was admitted on 04/08/2013 with dizziness, N/V as well as left-sided numbness and weakness. MRI of the brain showed acute infarct in the left cerebellum. MRA of the head with chronic right ICA artery occlusion and widely patent dominant right vertebral artery. Echocardiogram with ejection fraction of 60% no wall motion abnormalities. Carotid Dopplers showed left CEA patent and right vertebral artery flow is antegrade. Patient did not receive TPA. Neurology services consulted and recommended ASA for thrombotic stroke due to SVD--hold off on trial. maintained on socrates trial,ticagrelor versus aspirin for stroke prevention. Patient with severe HA as well as waxing and waning of left sided weakness with lability. MRI brain repeated 04/13/13 with new restricted  diffusion entire R-PCA involving right occipital lobe, mesial and posterior right temporal lobe, mid brain and posterior right thalamus and slight increase in left cerebellar stroke. [name]  done negative for LAA thrombus but extensive grade 4 bulky aortic debris and plaque in distal aorta that could be source of embolus--CT chest recommended to rule out penetrating ulcer--if positive will need dual antiplatelet therapy. Implantable loop recorder placed by Dr. Lovena Le yesterday. Patient with resultant dense Left hemiparesis, left inattention, left HH and dysphagia.  Patient transferred to CIR on 04/17/2013 .    Patient currently requires max-total assist with basic self-care skills secondary to muscle weakness, decreased cardiorespiratoy endurance, impaired timing and sequencing, unbalanced muscle activation, decreased coordination and decreased motor planning, decreased visual perceptual skills and field cut, decreased midline orientation, decreased attention to left and left side neglect, decreased initiation, decreased attention, decreased awareness, decreased problem solving, decreased safety awareness and decreased memory and decreased sitting balance, decreased standing balance, decreased postural control, hemiplegia and decreased balance strategies.  Prior to hospitalization, patient could complete BADLs with modified independent .  Patient will benefit from skilled intervention to decrease level of assist with basic self-care skills prior to discharge home with family.  Anticipate patient will require min-mod physical assistance and follow up home health.  OT - End of Session Activity Tolerance: Decreased this session Endurance Deficit: Yes OT Assessment Rehab Potential: Good OT Patient demonstrates impairments in the following area(s): Balance;Cognition;Endurance;Motor;Pain;Perception;Safety;Sensory;Vision OT Basic ADL's Functional Problem(s): Grooming;Eating;Bathing;Dressing;Toileting OT Transfers Functional Problem(s): Tub/Shower;Toilet OT Additional Impairment(s): Fuctional Use of Upper Extremity OT Plan OT Intensity: Minimum of 1-2 x/day, 45 to 90 minutes OT Frequency: 5 out  of 7 days OT Duration/Estimated Length of Stay: 22-28 days OT Treatment/Interventions: Balance/vestibular training;Cognitive remediation/compensation;Community reintegration;Discharge planning;DME/adaptive equipment instruction;Functional electrical stimulation;Neuromuscular re-education;Functional mobility training;Psychosocial support;Patient/family education;Self Care/advanced ADL retraining;Therapeutic Activities;UE/LE Strength taining/ROM;Therapeutic Exercise;Visual/perceptual remediation/compensation;UE/LE Coordination activities OT Self Feeding Anticipated Outcome(s): setup assist OT Basic Self-Care Anticipated Outcome(s): min assist bathing, dressing; setup assist grooming OT Toileting Anticipated Outcome(s): mod assist toilet task OT Bathroom Transfers Anticipated Outcome(s): min assist  OT Recommendation Patient destination: Home Follow Up Recommendations: Home health OT Equipment Recommended: Tub/shower bench;3 in 1 bedside comode   Skilled Therapeutic Intervention Pt seen for ADL retraining with focus on sitting balance, balance reactions, functional transfers, and attention to L. Pt required max-total assist for static sitting balance sitting unsupported EOB. Pt with minimal awareness of leaning to left and no initiation for balance reaction. Pt demonstrating verbal perseveration on question about family assistance after discharge and perseverating on requesting thin liquids. Pt completed squat pivot transfer to right with max assist and total assist for squat pivot transfer to left requiring manual facilitation and cues for anterior weight shift prior to transfers. Pt required max assist for sit<>stand demonstrating min hip extension and assistance to block L knee. Pt with L inattention/neglect throughout session requiring max cues to attend to L side of body. With [facility] assist for initiation, pt washed part of LUE. Pt very lethargic throughout session requiring max cues for eye opening. At  end of session pt returned to bed and therapist placed pillows for optimal positioning.   OT Evaluation Precautions/Restrictions  Precautions Precautions: Fall Precaution Comments: Lt field deficit and Lt inattention; LUE subluxation  Restrictions Weight Bearing Restrictions: No General   Vital Signs Therapy Vitals Pulse Rate: 62 BP: 184/52 mmHg Pain Pain Assessment Pain Assessment: No/denies pain Pain Score: 0-No pain Home Living/Prior Functioning Home Living Available Help at Discharge: Family Type of Home:  House Home Access: Stairs to enter [employer] of Steps: 3 Entrance Stairs-Rails: None Home Layout: Two level;Able to live on main level with bedroom/bathroom Alternate Level Stairs-Number of Steps: flight Alternate Level Stairs-Rails: Right  Lives With: Spouse Prior Function Level of Independence: Independent with homemaking with ambulation;Independent with transfers;Independent with basic ADLs  Able to Take Stairs?: Yes Driving: No Comments: Enjoys spending time with husband and grandchildren. Enjoys cooking. ADL   Vision/Perception  Vision - History Baseline Vision: Wears glasses only for reading Visual History: Cataracts Patient Visual Report: No change from baseline Vision - Assessment Vision Assessment: Vision not tested Additional Comments: Difficult to formally assess secondary to patient's decresaed arousal and inability to keep eyes open. Patient with decreased attention to L noted throughout session and appears to be field cut Perception Perception: Impaired Inattention/Neglect: Does not attend to left visual field;Does not attend to left side of body Praxis Praxis: Impaired Praxis Impairment Details: Initiation;Motor planning;Perseveration  Cognition Overall Cognitive Status: Impaired/Different from baseline Arousal/Alertness: Lethargic Orientation Level: Oriented to place;Oriented to person;Oriented to situation;Disoriented to  time Attention: Sustained Sustained Attention: Impaired Sustained Attention Impairment: Verbal basic;Functional basic Memory: Impaired Memory Impairment: Decreased recall of new information Awareness: Impaired Awareness Impairment: Intellectual impairment;Emergent impairment;Anticipatory impairment Problem Solving: Impaired Behaviors: Perseveration Safety/Judgment: Impaired Sensation Sensation Light Touch: Appears Intact Proprioception: Impaired by gross assessment Additional Comments: Proprioception appears to be impaired in bilat LE's; however, difficult to assess due to cognitive impairments Coordination Gross Motor Movements are Fluid and Coordinated: No Fine Motor Movements are Fluid and Coordinated: No Finger Nose Finger Test: unable to assess secondary to LUE hemiplegia and cognitive deficits Heel Shin Test: Unable to formally assess secondary to weakness, cognitive impairments Motor  Motor Motor: Hemiplegia;Abnormal tone;Within Functional Limits;Abnormal postural alignment and control Motor - Skilled Clinical Observations: L hemiplegia; L ankle clonus. In seated, pt exhibits significant R-sided lateral trunk lean, majority of weight on R hip causing passive elongation of trunk on R side. Mobility  Bed Mobility Bed Mobility: Rolling Right;Rolling Left;Supine to Sit;Sitting - Scoot to [employer] of Bed;Sit to Supine;Scooting to [facility] Rolling Right: 1: +1 Total assist Rolling Right Details: Tactile cues for initiation;Tactile cues for sequencing;Manual facilitation for weight shifting;Verbal cues for sequencing;Verbal cues for technique Rolling Left: 3: Mod assist Rolling Left Details: Tactile cues for initiation;Tactile cues for sequencing;Tactile cues for placement;Manual facilitation for weight shifting;Verbal cues for technique;Manual facilitation for placement;Verbal cues for sequencing Rolling Left Details (indicate cue type and reason): Multimodal cueing for R knee flexion, verbal  cueing for initiation of RUE movement across midline, manual placement of RUE at bed rail Supine to Sit: 2: Max assist;HOB flat Supine to Sit Details: Manual facilitation for weight shifting;Verbal cues for sequencing;Manual facilitation for placement;Tactile cues for sequencing;Tactile cues for initiation;Tactile cues for placement;Verbal cues for technique Sit to Supine: 1: +1 Total assist;HOB flat Sit to Supine - Details: Verbal cues for sequencing;Manual facilitation for placement;Verbal cues for technique;Manual facilitation for weight shifting;Tactile cues for initiation Scooting to HOB: 1: +2 Total assist Scooting to [facility]: Patient Percentage: 20% Scooting to [facility] Details: Tactile cues for placement;Tactile cues for weight bearing;Manual facilitation for weight bearing;Verbal cues for technique;Verbal cues for sequencing  Trunk/Postural Assessment     Balance   Extremity/Trunk Assessment RUE Assessment RUE Assessment: Within Functional Limits LUE Assessment LUE Assessment: Exceptions to [facility] (flaccid with subluxation noted)  FIM:  FIM - Grooming Grooming Steps: Wash, rinse, dry face Grooming: 2: Patient completes 1 of 4 or 2 of 5 steps FIM -  Bathing Bathing Steps Patient Completed: Chest;Right upper leg;Left upper leg;Abdomen Bathing: 2: Max-Patient completes 3-4 [id number] parts or 25-49% FIM - Upper Body Dressing/Undressing Upper body dressing/undressing steps patient completed: Put head through opening of pull over shirt/dress Upper body dressing/undressing: 2: Max-Patient completed 25-49% of tasks FIM - Lower Body Dressing/Undressing Lower body dressing/undressing: 1: Total-Patient completed less than 25% of tasks FIM - BEngineer, siteAssistive Devices: Arm rests Bed/Chair Transfer: 2: Supine > Sit: Max A (lifting assist/Pt. 25-49%);2: Bed > Chair or W/C: Max A (lift and lower assist);1: Chair or W/C > Bed: Total A (helper does all/Pt. < 25%)   Refer to Care  Plan for Long Term Goals  Recommendations for other services: None  Discharge Criteria: Patient will be discharged from OT if patient refuses treatment 3 consecutive times without medical reason, if treatment goals not met, if there is a change in medical status, if patient makes no progress towards goals or if patient is discharged from hospital.  The above assessment, treatment plan, treatment alternatives and goals were discussed and mutually agreed upon: by patient  PDuayne Cal2/18/2015, 12:18 PM

## 2013-04-18 NOTE — Plan of Care (Signed)
Problem: RH SKIN INTEGRITY Goal: RH STG MAINTAIN SKIN INTEGRITY WITH ASSISTANCE STG Maintain Skin Integrity With mod Assistance.  Outcome: Not Progressing Total assist for dressing change

## 2013-04-18 NOTE — Evaluation (Signed)
Speech Language Pathology Assessment and Plan  Patient Details  Name: Kelli [name] MRN: [id number] Date of Birth: 1950-07-10  SLP Diagnosis: Cognitive Impairments;Dysphagia  Rehab Potential: Fair ELOS: 22-28 days   Today's Date: 04/18/2013 Time: 6734-1937 Time Calculation (min): 61 min  Problem List:  Patient Active Problem List   Diagnosis Date Noted  . Acute CVA (cerebrovascular accident): R PCA infarct per MRI 04/13/13 04/13/2013  . CVA (cerebral infarction) 04/08/2013  . Acute ischemic stroke 04/08/2013  . Diplopia 04/08/2013  . Left-sided weakness 04/08/2013  . Vertigo 04/08/2013  . Paresthesias in left hand 04/08/2013  . Pre-diabetes 04/08/2013  . Cerebellar stroke 04/08/2013  . Dizziness and giddiness 04/08/2013  . Stroke 04/08/2013  . Depression 04/06/2013  . Peripheral arterial disease 03/16/2013  . History of stroke 01/30/2013  . Precordial pain 01/16/2013  . CKD (chronic kidney disease) stage 3, GFR 30-59 ml/min 01/16/2013  . Anxiety state, unspecified 12/08/2012  . Lumbago 12/08/2012  . Urinary frequency 11/14/2012  . Occlusion and stenosis of carotid artery with cerebral infarction 10/05/2012  . Mixed hyperlipidemia 10/05/2012  . Renal insufficiency 10/05/2012  . Hypertension, uncontrolled 10/04/2012  . Tobacco abuse 10/04/2012  . Obesity 10/04/2012   Past Medical History:  Past Medical History  Diagnosis Date  . Essential hypertension, benign   . Fibromyalgia   . Cerebral infarction Aug 2014    Bihemispheric watershed infarcts  . Mixed hyperlipidemia   . Borderline diabetes   . Urinary incontinence   . Carotid artery occlusion     Occluded RICA, status post left CEA  August 2014 - Dr. Donnetta Hutching  . CKD (chronic kidney disease) stage 3, GFR 30-59 ml/min   . 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 Urethral dilation  1980's   . Combined hysterectomy vaginal w/ mmk / a&p repair  1981  . Vaginal hysterectomy  1981    "partial" (10/04/2012)  . Endarterectomy Left 10/06/2012    Procedure: Carotid Endarterectomy with Finesse patch angioplasty;  Surgeon: [name], MD;  Location: [place];  Service: Vascular;  Laterality: Left;  . Loop recorder implant  04/16/13    MDT LinQ implanted for cryptogenic stroke  . [name] without cardioversion N/A 04/16/2013    Procedure: TRANSESOPHAGEAL ECHOCARDIOGRAM ([name]);  Surgeon: Josue Hector, MD;  Location: [facility] ENDOSCOPY;  Service: Cardiovascular;  Laterality: N/A;    Assessment / Plan / Recommendation Clinical Impression Pt is a 63 y.o. right-handed female with h/o fibromyalgia, hypertension, anxiety disorder, bi hemispheric watershed strokes August 2014 (CIR 08/14) maintained on aspirin therapy as well as left carotid CEA 2014. She was admitted on 04/08/13 with dizziness, N/V as well as left-sided numbness and weakness. MRI of the brain showed acute infarct in the left cerebellum. MRA of the head with chronic right ICA artery occlusion and widely patent dominant right vertebral artery. Echocardiogram with ejection fraction of 60% no wall motion abnormalities. Carotid Dopplers showed left CEA patent and right vertebral artery flow is antegrade. Pt did not receive TPA. Neurology services consulted and recommended ASA for thrombotic stroke due to SVD--hold off on trial. maintained on socrates trial,ticagrelor versus aspirin for stroke prevention. Patient with severe HA as well as waxing and waning of left sided weakness with lability. MRI brain repeated 04/13/13 with new restricted diffusion entire R-PCA involving right occipital lobe, mesial and posterior right temporal lobe, mid brain  and posterior right thalamus and slight increase in left cerebellar stroke. [name] done negative for LAA thrombus but extensive grade 4 bulky aortic debris and plaque in distal aorta that could be source of embolus--CT chest  recommended to rule out penetrating ulcer--if positive will need dual antiplatelet therapy. Implantable loop recorder placed. Pt with resultant dense Left hemiparesis, left inattention, left HH and dysphagia. Pt was admitted to CIR 02/17 with SLP bedside-swallow and cognitive-linguistic evaluation completed 2/18. Pt continues to present with a moderate oral dysphagia with poor awareness, manipulation, and transit of boluses with suspected premature spillage. Pt continues to exhibit overt s/s of aspiration intermittently with thin liquids, with aspiration risk further exacerbated by lethargy. Pt admits to being noncompliant, having family members bring her thin liquids and Dys 3 and regular textures. SLP educated pt and daughter-in-law, who arrived at the end of the session, regarding the risks of aspiration. Will plan to perform MBS tomorrow to assess for readiness for advancement. Pt also presents with disorientation to time, left-inattention, and impaired sustained attention, basic problem solving, and recall of new information. Pt will benefit from SLP services to maximize swallowing safety and functional independence prior to discharge home with family.   Skilled Therapeutic Interventions          SLP bedside-swallow and cognitive-linguistic evaluations were administered with results and recommendations reviewed with patient and daughter-in-law, Jennifer.    SLP Assessment  Patient will need skilled Speech Lanaguage Pathology Services during CIR admission    Recommendations  Recommended Consults: MBS Diet Recommendations: Nectar-thick liquid;Dysphagia 2 (Fine chop) Liquid Administration via: Cup;No straw Medication Administration: Crushed with puree Supervision: Full supervision/cueing for compensatory strategies;Patient able to self feed Compensations: Slow rate;Small sips/bites;Check for pocketing Postural Changes and/or Swallow Maneuvers: Seated upright 90 degrees;Upright 30-60 min after  meal Oral Care Recommendations: Oral care BID Patient destination: Home Follow up Recommendations: Home Health SLP;24 hour supervision/assistance Equipment Recommended: None recommended by SLP    SLP Frequency 5 out of 7 days   SLP Treatment/Interventions Cognitive remediation/compensation;Cueing hierarchy;Dysphagia/aspiration precaution training;Environmental controls;Functional tasks;Internal/external aids;Patient/family education;Oral motor exercises    Pain Pain Assessment Pain Assessment: No/denies pain Prior Functioning Cognitive/Linguistic Baseline: Information not available Type of Home: House  Lives With: Spouse Available Help at Discharge: Family  Short Term Goals: Week 1: SLP Short Term Goal 1 (Week 1): Pt will utilize safe swallowing strategies with current diet with Min cues SLP Short Term Goal 2 (Week 1): Pt attend to her left visual field during basic functional task with Mod cues SLP Short Term Goal 3 (Week 1): Pt will sustain attention to basic functional task for 5 minutes with Mod cues SLP Short Term Goal 4 (Week 1): Pt will utilize environmental cues to demonstrate orientation x4 with Mod cues SLP Short Term Goal 5 (Week 1): Pt will demonstrate adequate problem solving during basic functional task with Mod cues  See FIM for current functional status Refer to Care Plan for Long Term Goals  Recommendations for other services: None  Discharge Criteria: Patient will be discharged from SLP if patient refuses treatment 3 consecutive times without medical reason, if treatment goals not met, if there is a change in medical status, if patient makes no progress towards goals or if patient is discharged from hospital.  The above assessment, treatment plan, treatment alternatives and goals were discussed and mutually agreed upon: by patient and by family    Paiewonsky, M.A. CCC-SLP [phone]  Paiewonsky,  04/18/2013, 4:02 PM   

## 2013-04-18 NOTE — Evaluation (Signed)
Physical Therapy Assessment and Plan  Patient Details  Name: Kelli [name] MRN: [id number] Date of Birth: 03-Jan-1951  PT Diagnosis: Abnormal posture, Abnormality of gait, Cognitive deficits, Hemiplegia non-dominant and Muscle weakness  Rehab Potential: Good ELOS: 22-28 days   Today's Date: 04/18/2013 Time: 0900-1000 and 9811-9147 Time Calculation (min): 60 min and 33 min  Problem List:  Patient Active Problem List   Diagnosis Date Noted  . Acute CVA (cerebrovascular accident): R PCA infarct per MRI 04/13/13 04/13/2013  . CVA (cerebral infarction) 04/08/2013  . Acute ischemic stroke 04/08/2013  . Diplopia 04/08/2013  . Left-sided weakness 04/08/2013  . Vertigo 04/08/2013  . Paresthesias in left hand 04/08/2013  . Pre-diabetes 04/08/2013  . Cerebellar stroke 04/08/2013  . Dizziness and giddiness 04/08/2013  . Stroke 04/08/2013  . Depression 04/06/2013  . Peripheral arterial disease 03/16/2013  . History of stroke 01/30/2013  . Precordial pain 01/16/2013  . CKD (chronic kidney disease) stage 3, GFR 30-59 ml/min 01/16/2013  . Anxiety state, unspecified 12/08/2012  . Lumbago 12/08/2012  . Urinary frequency 11/14/2012  . Occlusion and stenosis of carotid artery with cerebral infarction 10/05/2012  . Mixed hyperlipidemia 10/05/2012  . Renal insufficiency 10/05/2012  . Hypertension, uncontrolled 10/04/2012  . Tobacco abuse 10/04/2012  . Obesity 10/04/2012    Past Medical History:  Past Medical History  Diagnosis Date  . Essential hypertension, benign   . Fibromyalgia   . Cerebral infarction Aug 2014    Bihemispheric watershed infarcts  . Mixed hyperlipidemia   . Borderline diabetes   . Urinary incontinence   . Carotid artery occlusion     Occluded RICA, status post left CEA  August 2014 - Dr. Donnetta Hutching  . CKD (chronic kidney disease) stage 3, GFR 30-59 ml/min   . 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 Urethral dilation  1980's  . Combined hysterectomy vaginal w/ mmk / a&p repair  1981  . Vaginal hysterectomy  1981    "partial" (10/04/2012)  . Endarterectomy Left 10/06/2012    Procedure: Carotid Endarterectomy with Finesse patch angioplasty;  Surgeon: [name], MD;  Location: [place];  Service: Vascular;  Laterality: Left;  . Loop recorder implant  04/16/13    MDT LinQ implanted for cryptogenic stroke  . [name] without cardioversion N/A 04/16/2013    Procedure: TRANSESOPHAGEAL ECHOCARDIOGRAM ([name]);  Surgeon: Josue Hector, MD;  Location: [facility] ENDOSCOPY;  Service: Cardiovascular;  Laterality: N/A;    Assessment & Plan Clinical Impression: Kelli [name] is a 63 y.o. 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 She was admitted on 04/08/2013 with dizziness, N/V as well as left-sided numbness and weakness. MRI of the brain showed acute infarct in the left cerebellum. MRA of the head with chronic right ICA artery occlusion and widely patent dominant right vertebral artery. Echocardiogram with ejection fraction of 60% no wall motion abnormalities. Carotid Dopplers showed left CEA patent and right vertebral artery flow is antegrade. Patient did not receive TPA. Neurology services consulted and recommended ASA for thrombotic stroke due to SVD--hold off on trial. maintained on socrates trial,ticagrelor versus aspirin for stroke prevention. Patient with severe HA as well as waxing and waning of left sided weakness with lability. MRI brain repeated 04/13/13 with new  restricted diffusion entire R-PCA involving right occipital lobe, mesial and posterior right temporal lobe, mid brain and posterior right thalamus and slight increase in left cerebellar stroke. [name] done negative for LAA thrombus but  extensive grade 4 bulky aortic debris and plaque in distal aorta that could be source of embolus--placed on ECASA + plavix by neurology today. Implantable loop recorder placed by Dr. Lovena Le yesterday. Patient with resultant dense Left hemiparesis, left inattention, left HH and dysphagia, Patient transferred to CIR on 04/17/2013 .   Patient currently requires max-total assist with mobility secondary to muscle weakness, decreased cardiorespiratoy endurance, impaired timing and sequencing, unbalanced muscle activation, motor apraxia, decreased coordination and decreased motor planning, decreased visual perceptual skills and field cut, decreased midline orientation, decreased attention to left and left side neglect, decreased initiation, decreased attention, decreased awareness, decreased problem solving, decreased safety awareness and decreased memory and decreased sitting balance, decreased standing balance, decreased postural control, hemiplegia and decreased balance strategies.  Prior to hospitalization, patient was independent  with mobility and lived with Spouse in a House home.  Home access is 3Stairs to enter.  Patient will benefit from skilled PT intervention to maximize safe functional mobility and minimize fall risk for planned discharge home with 24 hour assist.  Anticipate patient will benefit from follow up [facility] at discharge.  PT - End of Session Activity Tolerance: Tolerates 30+ min activity with multiple rests Endurance Deficit: Yes Endurance Deficit Description: Significantly more assistance required for functional transfers at end of session. PT Assessment Rehab Potential: Good Barriers to Discharge: Inaccessible home environment;Other (comment) (3 steps to enter home, no rails) PT Patient demonstrates impairments in the following area(s): Balance;Endurance;Motor;Perception;Safety PT Transfers Functional Problem(s): Bed Mobility;Bed to Chair;Car;Furniture PT Locomotion Functional  Problem(s): Ambulation;Wheelchair Mobility;Stairs PT Plan PT Intensity: Minimum of 1-2 x/day ,45 to 90 minutes PT Frequency: 5 out of 7 days PT Duration Estimated Length of Stay: 22-28 days PT Treatment/Interventions: Ambulation/gait training;Balance/vestibular training;Cognitive remediation/compensation;Disease management/prevention;Discharge planning;DME/adaptive equipment instruction;Functional mobility training;Patient/family education;Neuromuscular re-education;Splinting/orthotics;Stair training;Therapeutic Activities;Therapeutic Exercise;Visual/perceptual remediation/compensation;UE/LE Strength taining/ROM;Wheelchair propulsion/positioning PT Transfers Anticipated Outcome(s): Min A PT Locomotion Anticipated Outcome(s): Mod A PT Recommendation Recommendations for Other Services: Other (comment) (None at this time) Follow Up Recommendations: Home health PT;24 hour supervision/assistance Patient destination: Home Equipment Recommended: To be determined  Skilled Therapeutic Intervention Treatment Session 1: PT evaluation performed; see below for detailed findings. Treatment initiated. Session focused on functional transfers and initiation of gait training. Supine>sit with max A, HOB flat, no rails. Pt performed scoot pivot from bed>w/c with max A; see below for cueing and facilitation required. Transported pt to hallway in w/c, where pt performed sit<>stand from w/c with R hallway hand rail, max A. Performed gait x5' in controlled environment with R hallway hand rail and +2A (w/c follow for safety).Therapist positioned under pt's LUE providing manual facilitation of LLE advancement and manual stabilization of L knee during stance phase of gait. See below for detailed description of gait pattern. Returned to room, where pt performed squat pivot from w/c>bed with +1Total A. Sit>supine with +1Total A, HOB flat, no rails. Performed scooting to [facility] with +2Total A. Therapist departed with pt semi-reclined  in bed with RN present, 3 bed rails up, bed alarm on, and all needs within reach.  Treatment Session 2: Pt received semi-reclined in bed with daughter-in-law and granddaughter present. Son and husband arrived soon thereafter. Pt agreeable to therapy. Session focused on functional transfers, w/c mobility. Pt performed supine>sit with mod A using rails with HOB elevated. Squat pivot (  Bobath technique) from bed>w/c with max A, verbal cueing for setup/hand placement, manual facilitation of anterior weight shift, manual stabilization of L knee. Once in w/c, performed self-propulsion of manual w/c x16' in controlled environment with max A using R hemi technique; manual facilitation of RLE technique with inconsistent pt carryover. Returned to pt room, where pt performed squat pivot from w/c>bed (Bobath technique) with mod A, manual facilitation/stabilization as described above, effective carryover of technique. Sit>supine with mod A with HOB flat, no rails. Performed scooting to [facility] with max A. Therapist departed with pt semi-reclined in bed with husband present, 3 bed rails up, bed alarm on, and all needs within reach.  PT Evaluation Precautions/Restrictions Precautions Precautions: Fall Precaution Comments: Lt field deficit and Lt inattention; LUE subluxation  Restrictions Weight Bearing Restrictions: No General   Vital SignsTherapy Vitals Pulse Rate: 62 BP: 184/52 mmHg Pain Pain Assessment Pain Assessment: No/denies pain Pain Score: 0-No pain Home Living/Prior Functioning Home Living Available Help at Discharge: Family Type of Home: House Home Access: Stairs to enter Technical brewer of Steps: 3 Entrance Stairs-Rails: None Home Layout: Two level;Able to live on main level with bedroom/bathroom Alternate Level Stairs-Number of Steps: flight  Lives With: Spouse Prior Function Level of Independence: Independent with homemaking with ambulation;Independent with transfers  Able to Take  Stairs?: Yes Driving: No Comments: Enjoys spending time with husband and grandchildren. Enjoys cooking. Vision/Perception  Vision - History Baseline Vision: Wears glasses only for reading Visual History: Cataracts Patient Visual Report: No change from baseline Vision - Assessment Vision Assessment: Vision not tested Additional Comments: Difficult to formally assess secondary to patient's decreased arousal and inability to keep eyes open. Pt with decreased attention to L noted throughout session and appears to be field cut. Perception Perception: Impaired Inattention/Neglect: Does not attend to left visual field;Does not attend to left side of body Praxis Praxis: Impaired Praxis Impairment Details: Initiation;Motor planning;Perseveration  Cognition Overall Cognitive Status: Impaired/Different from baseline Arousal/Alertness: Lethargic Orientation Level: Oriented to place;Oriented to person;Oriented to situation;Disoriented to time Attention: Sustained Sustained Attention: Impaired Sustained Attention Impairment: Verbal basic;Functional basic Memory: Impaired Memory Impairment: Decreased recall of new information Awareness: Impaired Awareness Impairment: Intellectual impairment;Emergent impairment;Anticipatory impairment Problem Solving: Impaired Problem Solving Impairment: Verbal basic;Functional basic Executive Function:  (impaired due to lower level deficits) Safety/Judgment: Impaired Sensation Sensation Light Touch: Appears Intact Proprioception: Impaired by gross assessment Additional Comments: Proprioception appears to be impaired in bilat LE's; however, difficult to assess due to cognitive impairments, decreased pt arousak Coordination Gross Motor Movements are Fluid and Coordinated: No Fine Motor Movements are Fluid and Coordinated: No Finger Nose Finger Test: unable to assess secondary to LUE hemiplegia and cognitive deficits Heel Shin Test: Unable to formally assess  secondary to L hemiplegia, cognitive impairments Motor  Motor Motor: Hemiplegia;Abnormal tone;Within Functional Limits;Abnormal postural alignment and control Motor - Skilled Clinical Observations: L hemiplegia; L ankle clonus. In seated, pt exhibits significant R-sided lateral trunk lean, majority of weight on R hip causing passive elongation of trunk on R side.  Mobility Bed Mobility Bed Mobility: Rolling Right;Rolling Left;Supine to Sit;Sitting - Scoot to [employer] of Bed;Sit to Supine;Scooting to [facility] Rolling Right: 1: +1 Total assist Rolling Right Details: Tactile cues for initiation;Tactile cues for sequencing;Manual facilitation for weight shifting;Verbal cues for sequencing;Verbal cues for technique Rolling Left: 3: Mod assist Rolling Left Details: Tactile cues for initiation;Tactile cues for sequencing;Tactile cues for placement;Manual facilitation for weight shifting;Verbal cues for technique;Manual facilitation for placement;Verbal cues for sequencing Rolling Left Details (indicate  cue type and reason): Multimofal cueing for R knee flexion; verbal cueing for initiation of RUE movement across midline; manual placement of RUE at bed rail. Supine to Sit: 2: Max assist;HOB flat Supine to Sit Details: Manual facilitation for weight shifting;Verbal cues for sequencing;Manual facilitation for placement;Tactile cues for sequencing;Tactile cues for initiation;Tactile cues for placement;Verbal cues for technique Sit to Supine: 1: +1 Total assist;HOB flat Sit to Supine - Details: Verbal cues for sequencing;Manual facilitation for placement;Verbal cues for technique;Manual facilitation for weight shifting;Tactile cues for initiation Scooting to HOB: 1: +2 Total assist Scooting to [facility]: Patient Percentage: 20% Scooting to [facility] Details: Tactile cues for placement;Tactile cues for weight bearing;Manual facilitation for weight bearing;Verbal cues for technique;Verbal cues for sequencing Transfers Transfers:  Yes Sit to Stand: 2: Max assist;From chair/3-in-1;With upper extremity assist Sit to Stand Details: Verbal cues for technique;Manual facilitation for weight shifting;Tactile cues for weight beaing;Verbal cues for sequencing Sit to Stand Details (indicate cue type and reason): Sit>stand from w/c with RUE support at hallway hand rail; manual facilitation of anterior weight shift; tactile cueing at L knee for weightbearing Stand to Sit: 2: Max assist;Without upper extremity assist;To chair/3-in-1 Squat Pivot Transfers: 1: +1 Total assist;With armrests;With upper extremity assistance Squat Pivot Transfer Details: Verbal cues for sequencing;Verbal cues for technique;Tactile cues for sequencing;Tactile cues for weight shifting;Manual facilitation for weight shifting Lateral/Scoot Transfers: 2: Max assist;With armrests removed Lateral/Scoot Transfer Details: Verbal cues for sequencing;Tactile cues for sequencing;Tactile cues for weight shifting;Manual facilitation for weight shifting;Verbal cues for technique Locomotion  Ambulation Ambulation: Yes Ambulation/Gait Assistance: 1: +2 Total assist Ambulation Distance (Feet): 5 Feet Assistive device: Other (Comment) (R hallway hand rail) Ambulation/Gait Assistance Details: Tactile cues for initiation;Manual facilitation for placement;Manual facilitation for weight shifting;Verbal cues for technique;Tactile cues for sequencing;Verbal cues for sequencing;Tactile cues for posture Ambulation/Gait Assistance Details: Gait x5' in controlled environment with R hallway hand rail and +2A (w/c follow for safety). Therapist positioned under pt's LUE providing manual facilitation of LLE advancement and manual stabilization of L knee during stance phase of gait. Gait Gait: Yes Gait Pattern: Impaired Gait Pattern: Step-to pattern;Decreased stance time - left;Decreased step length - right;Decreased dorsiflexion - left;Decreased weight shift to right;Left flexed knee in  stance;Trunk rotated posteriorly on left Gait velocity: Decreased Stairs / Additional Locomotion Stairs: No (Unsafe at this time) Architect: Yes Wheelchair Assistance: 2: Max Lexicographer: Right upper extremity;Right lower extremity Wheelchair Parts Management: Needs assistance Distance: 16  Trunk/Postural Assessment  Cervical Assessment Cervical Assessment: Exceptions to [facility] Cervical AROM Overall Cervical AROM: Deficits Overall Cervical AROM Comments: Limited L cervical spine rotation AROM secondary to L-sided inattention, tendency toward R rotation at rest. Thoracic Assessment Thoracic Assessment: Within Functional Limits Lumbar Assessment Lumbar Assessment: Within Functional Limits Postural Control Postural Control: Deficits on evaluation Head Control: Pt with difficulty maintaining head at midline which may be attributable to impaired R gaze preference, impaired midline orientation, or decreased muscular endurance of cervical spine musculature Trunk Control: In static sitting with/without UE support, pt exhibits multiple LOB to L side Protective Responses: Unable to actively avoid L LOB in seated secondary to L hemiplegia Postural Limitations: In seated, pt in posterior pelvic tilt, bearing majority of weight on R hip with trunk lateral flexion to L.  Balance Balance Balance Assessed: Yes Static Sitting Balance Static Sitting - Balance Support: No upper extremity supported;Right upper extremity supported;Feet supported Static Sitting - Level of Assistance: 2: Max assist;3: Mod assist;4: Min assist Static Sitting - Comment/#  of Minutes: Seated EOB, pt requires min-mod A for stability with RUE support at bed rail; pt requires mod-max A for stability without UE support secondary to posterior/L LOB. Dynamic Sitting Balance Dynamic Sitting - Balance Support: Right upper extremity supported;Feet supported;During functional  activity Dynamic Sitting - Level of Assistance: 3: Mod assist Sitting balance - Comments: Seated EOB, anterior weight shift (preparing to scoot to R side) with RUE support. Static Standing Balance Static Standing - Balance Support: Right upper extremity supported;During functional activity Static Standing - Level of Assistance: 2: Max assist Static Standing - Comment/# of Minutes: Static standing with RUE support at hallway hand rail, max A for stability/balance, manual stabilization of LLE. Extremity Assessment  RUE Assessment RUE Assessment: Within Functional Limits LUE Assessment LUE Assessment: Exceptions to [facility] (flaccid with subluxation noted) RLE Assessment RLE Assessment: Within Functional Limits LLE Assessment LLE Assessment: Exceptions to [facility] LLE Strength LLE Overall Strength: Deficits LLE Overall Strength Comments: 0/5; no palpable muscle contraction LLE Tone LLE Tone Comments: Clonus (>15 beats) in L ankle plantarflexors  FIM:  FIM - Bed/Chair Transfer Bed/Chair Transfer Assistive Devices: Arm rests Bed/Chair Transfer: 1: Chair or W/C > Bed: Total A (helper does all/Pt. < 25%);2: Bed > Chair or W/C: Max A (lift and lower assist);1: Sit > Supine: Total A (helper does all/Pt. < 25%);2: Supine > Sit: Max A (lifting assist/Pt. 25-49%) FIM - Locomotion: Wheelchair Locomotion: Wheelchair: 0: Activity did not occur (w/c height inappropriate; will assess in pm) FIM - Locomotion: Ambulation Locomotion: Ambulation Assistive Devices: Other (comment) (R hallway hand rail) Ambulation/Gait Assistance: 1: +2 Total assist Locomotion: Ambulation: 1: Two helpers (w/c follow) FIM - Locomotion: Stairs Locomotion: Stairs: 0: Activity did not occur (Unsafe at this time)   Refer to Care Plan for Long Term Goals  Recommendations for other services: None  Discharge Criteria: Patient will be discharged from PT if patient refuses treatment 3 consecutive times without medical reason, if  treatment goals not met, if there is a change in medical status, if patient makes no progress towards goals or if patient is discharged from hospital.  The above assessment, treatment plan, treatment alternatives and goals were discussed and mutually agreed upon: by patient and by family  Stefano Gaul 04/18/2013, 6:21 PM

## 2013-04-19 ENCOUNTER — Inpatient Hospital Stay (HOSPITAL_COMMUNITY): Payer: 59 | Admitting: Physical Therapy

## 2013-04-19 ENCOUNTER — Inpatient Hospital Stay (HOSPITAL_COMMUNITY): Payer: 59

## 2013-04-19 DIAGNOSIS — I6330 Cerebral infarction due to thrombosis of unspecified cerebral artery: Secondary | ICD-10-CM

## 2013-04-19 DIAGNOSIS — N183 Chronic kidney disease, stage 3 unspecified: Secondary | ICD-10-CM

## 2013-04-19 DIAGNOSIS — I69991 Dysphagia following unspecified cerebrovascular disease: Secondary | ICD-10-CM

## 2013-04-19 DIAGNOSIS — F411 Generalized anxiety disorder: Secondary | ICD-10-CM

## 2013-04-19 DIAGNOSIS — I10 Essential (primary) hypertension: Secondary | ICD-10-CM

## 2013-04-19 LAB — GLUCOSE, CAPILLARY
GLUCOSE-CAPILLARY: 108 mg/dL — AB (ref 70–99)
GLUCOSE-CAPILLARY: 104 mg/dL — AB (ref 70–99)
GLUCOSE-CAPILLARY: 104 mg/dL — AB (ref 70–99)
Glucose-Capillary: 130 mg/dL — ABNORMAL HIGH (ref 70–99)
Glucose-Capillary: 116 mg/dL — ABNORMAL HIGH (ref 70–99)

## 2013-04-19 MED ORDER — ATENOLOL 50 MG PO TABS
50.0000 mg | ORAL_TABLET | Freq: Two times a day (BID) | ORAL | Status: DC
  Administered 2013-04-19 – 2013-05-08 (×35): 50 mg via ORAL
  Filled 2013-04-19 (×42): qty 1

## 2013-04-19 MED ORDER — CLONIDINE HCL 0.1 MG PO TABS
0.1000 mg | ORAL_TABLET | Freq: Once | ORAL | Status: AC
  Administered 2013-04-19: 0.1 mg via ORAL
  Filled 2013-04-19: qty 1

## 2013-04-19 MED ORDER — SODIUM CHLORIDE 0.45 % IV SOLN
INTRAVENOUS | Status: DC
  Administered 2013-04-19: 75 mL/h via INTRAVENOUS
  Administered 2013-04-23 – 2013-04-26 (×3): via INTRAVENOUS
  Administered 2013-04-27: 75 mL/h via INTRAVENOUS
  Administered 2013-04-30 – 2013-05-02 (×4): via INTRAVENOUS

## 2013-04-19 NOTE — IPOC Note (Addendum)
Overall Plan of Care [facility]) Patient Details Name: Kelli [name] MRN: [id number] DOB: [birth year]  Admitting Diagnosis: CVA  Hospital Problems: Active Problems:   CVA (cerebral infarction)     Functional Problem List: Nursing Bladder;Bowel;Medication Management;Nutrition;Pain;Perception;Safety;Skin Integrity  PT Balance;Endurance;Motor;Perception;Safety  OT Balance;Cognition;Endurance;Motor;Pain;Perception;Safety;Sensory;Vision  SLP Cognition  TR  activity tolerance, balance, pain, safety, vision       Basic ADL's: OT Grooming;Eating;Bathing;Dressing;Toileting     Advanced  ADL's: OT       Transfers: PT Bed Mobility;Bed to Chair;Car;Furniture  OT Tub/Shower;Toilet     Locomotion: PT Ambulation;Wheelchair Mobility;Stairs     Additional Impairments: OT Fuctional Use of Upper Extremity  SLP Swallowing;Social Cognition   Problem Solving;Attention;Awareness;Memory  TR      Anticipated Outcomes Item Anticipated Outcome  Self Feeding setup assist  Swallowing  Supervision with least restrictive PO   Basic self-care  min assist bathing, dressing; setup assist grooming  Toileting  mod assist toilet task   Bathroom Transfers min assist   Bowel/Bladder  min assist  Transfers  Min A  Locomotion  Mod A  Communication     Cognition  Supervision  Pain  less than 3  Safety/Judgment  min assist   Therapy Plan: PT Intensity: Minimum of 1-2 x/day ,45 to 90 minutes PT Frequency: 5 out of 7 days PT Duration Estimated Length of Stay: 22-28 days OT Intensity: Minimum of 1-2 x/day, 45 to 90 minutes OT Frequency: 5 out of 7 days OT Duration/Estimated Length of Stay: 22-28 days SLP Intensity: Minumum of 1-2 x/day, 30 to 90 minutes SLP Frequency: 5 out of 7 days SLP Duration/Estimated Length of Stay: 22-28 days       Team Interventions: Nursing Interventions Patient/Family Education;Bladder Management;Bowel Management;Pain Management;Disease Management/Prevention;Skin  Care/Wound Management;Medication Management;Psychosocial Support;Dysphagia/Aspiration Precaution Training;Cognitive Remediation/Compensation;Discharge Planning  PT interventions Ambulation/gait training;Balance/vestibular training;Cognitive remediation/compensation;Disease management/prevention;Discharge planning;DME/adaptive equipment instruction;Functional mobility training;Patient/family education;Neuromuscular re-education;Splinting/orthotics;Stair training;Therapeutic Activities;Therapeutic Exercise;Visual/perceptual remediation/compensation;UE/LE Strength taining/ROM;Wheelchair propulsion/positioning  OT Interventions Balance/vestibular training;Cognitive remediation/compensation;Community reintegration;Discharge planning;DME/adaptive equipment instruction;Functional electrical stimulation;Neuromuscular re-education;Functional mobility training;Psychosocial support;Patient/family education;Self Care/advanced ADL retraining;Therapeutic Activities;UE/LE Strength taining/ROM;Therapeutic Exercise;Visual/perceptual remediation/compensation;UE/LE Coordination activities  SLP Interventions Cognitive remediation/compensation;Cueing hierarchy;Dysphagia/aspiration precaution training;Environmental controls;Functional tasks;Internal/external aids;Patient/family education;Oral motor exercises  TR Interventions  recreation/leisure participation, therapeutic activities, balance training,m cognitive retraining, functional mobility, adaptive equipment use, UE/LE coordination/strengthening, pt/family education, discharge planning, community reintegration education, psychosocial support  SW/CM Interventions Discharge Planning;Psychosocial Support;Patient/Family Education    Team Discharge Planning: Destination: PT-Home ,OT- Home , SLP-Home Projected Follow-up: PT-Home health PT;24 hour supervision/assistance, OT-  Home health OT, SLP-Home Health SLP;24 hour supervision/assistance Projected Equipment Needs: PT-To be  determined, OT- Tub/shower bench;3 in 1 bedside comode, SLP-None recommended by SLP Equipment Details: PT- , OT-  Patient/family involved in discharge planning: PT- Family member/caregiver;Patient,  OT-Patient, SLP-Patient;Family member/caregiver  MD ELOS: 20-28 Medical Rehab Prognosis:  Good Assessment: 63 y.o. 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 She was admitted on 04/08/2013 with dizziness, N/V as well as left-sided numbness and weakness. MRI of the brain showed acute infarct in the left cerebellum. MRA of the head with chronic right ICA artery occlusion and widely patent dominant right vertebral artery. Echocardiogram with ejection fraction of 60% no wall motion abnormalities. Carotid Dopplers showed left CEA patent and right vertebral artery flow is antegrade. Patient did not receive TPA. Neurology services consulted and recommended ASA for thrombotic stroke due to SVD--hold off on trial. maintained on socrates trial,ticagrelor  versus aspirin for stroke prevention. Patient with severe HA as well as waxing and waning of left sided weakness with lability. MRI brain repeated 04/13/13 with new restricted diffusion entire R-PCA involving right occipital lobe, mesial and posterior right temporal lobe, mid brain and posterior right thalamus and slight increase in left cerebellar stroke. [name] done negative for LAA thrombus but extensive grade 4 bulky aortic debris   Now requiring 24/7 Rehab RN,MD, as well as CIR level PT, OT and SLP.  Treatment team will focus on ADLs and mobility with goals set at [facility] / Mod A    See Team Conference Notes for weekly updates to the plan of care

## 2013-04-19 NOTE — Progress Notes (Signed)
Social Work Patient ID: Kelli [name], female   DOB: 01/15/1951, 62 y.o.   MRN: [id number]  CSW met briefly with pt, as she was with the SLP for her assessment.  CSW then called pt's husband to update him on team conference discussion, informing him of targeted d/c date of 05-12-13.  CSW also told pt that CSW would again be following pt while she is on rehab unit.  Husband has already begun investigating 24/7 hired caregiver options for pt for when she comes home.  CSW will continue to keep him updated on pt's progress/needs and will assist pt/family with d/c planning process.  CSW to f/u with pt again at a better time for pt.  

## 2013-04-19 NOTE — Progress Notes (Signed)
Inpatient [place] Individual Statement of Services  Patient Name:  Kelli [name]  Date:  04/19/2013  Welcome to the [facility].  Our goal is to provide you with an individualized program based on your diagnosis and situation, designed to meet your specific needs.  With this comprehensive rehabilitation program, you will be expected to participate in at least 3 hours of rehabilitation therapies Monday-Friday, with modified therapy programming on the weekends.  Your rehabilitation program will include the following services:  Physical Therapy (PT), Occupational Therapy (OT), Speech Therapy (ST), 24 hour per day rehabilitation nursing, Therapeutic Recreation (TR), Neuropsychology, Case Management (Social Worker), Rehabilitation Medicine, Nutrition Services and Pharmacy Services  Weekly team conferences will be held on Wednesdays to discuss your progress.  Your Social Worker will talk with you frequently to get your input and to update you on team discussions.  Team conferences with you and your family in attendance may also be held.  Expected length of stay:  3-4 weeks  Overall anticipated outcome:  Minimal Assistance  Depending on your progress and recovery, your program may change. Your Social Worker will coordinate services and will keep you informed of any changes. Your Social Worker's name and contact numbers are listed  below.  The following services may also be recommended but are not provided by the [facility] will be made to provide these services after discharge if needed.  Arrangements include referral to agencies that provide these services.  Your insurance has been verified to be:  [employer] Your primary doctor is:  Dr. Consuello Masse  Pertinent information will be shared with your doctor and your insurance  company.  Social Worker:  Alfonse Alpers, LCSW  [phone] or [phone]  Information discussed with and copy given to patient by: Trey Sailors, 04/19/2013, 1:27 PM

## 2013-04-19 NOTE — Progress Notes (Signed)
B/P taken (manually) at 2028, prior to administering Atenolol read 202/76. Reesa Chew, PA. made aware. Rechecked B/P at 2142: 184/60. Ordered to monitor B/P. At 2325: 204/74. New order for Catapres 0.1mg , 1 x dose, given at 2345. Patient has been laughing and talking throughout. Was changed, repositioned and made comfortable. Denied headache. Will continue to monitor. Daniqua Campoy A. Rembert Browe, RN

## 2013-04-19 NOTE — Progress Notes (Addendum)
Physical Therapy Session Note  Patient Details  Name: Kelli [name] MRN: [id number] Date of Birth: Oct 31, 1950  Today's Date: 04/19/2013 Time: [id number] and [id number] Time Calculation (min): 48 min and 38 min  Short Term Goals: Week 1:  PT Short Term Goal 1 (Week 1): Pt to consistently perform supine<>sit with mod A with HOB flat using bed rail. PT Short Term Goal 2 (Week 1): Pt to consistently perform bed<>chair transfer with mod A. PT Short Term Goal 3 (Week 1): Pt to perform w/c mobility x50' in controlled environment with mod A. PT Short Term Goal 4 (Week 1): Pt to perform sit<>stand with mod A. PT Short Term Goal 5 (Week 1): Pt to perform gait x15' in controlled environment with +1Total A.  Skilled Therapeutic Interventions/Progress Updates:    Treatment Session 1: 2:1. Pt received semi-reclined in bed; agreeable to session. Pt performed supine>sit with HOB flat, no rails, max A, mod multimodal cueing for technique, sequencing. Performed squat pivot transfer from bed>bedside commode with +2A for safety due to pt inconsistently following 1-step commands. L knee manually stabilized during transfer. Pt seated on bedside commode x12 minutes with RUE support requiring supervision-min A, verbal cueing to increase pt awareness of significant lateral trunk lean to R, R cervical spine rotation and R lateral flexion. Pt verbalizes awareness of seated posture but does not initiate self-correction.  Pt performed sit>stand with total A, manual stabilization of L knee. Static standing x45 sec with max-total A while CNA performed peri care. Stand pivot transfer from bedside commode>bed with +2A. Pt performed sit>supine with mod A to control descent of trunk, for LLE management. With [facility] elevated, pt performed oral hygiene (per pt request) with supervision. Therapist departed with pt semi-reclined in bed with 3 bed rails up, bed alarm on, and all needs within reach.  Treatment Session 2: 1:1. Pt received  semi-reclined in bed with husband present. Pt agreeable to therapy. Session focused on bed mobility, increasing postural stability in sitting. Pt performed supine>sit with mod A with HOB elevated 30 degrees using bed rail; multimodal cueing for technique and sequencing with effective between-session carryover of ~25% of cues provided during AM session. See below for detailed description of sitting balance interventions. Sit<>stand from bed x2 with max A, manual facilitation of anterior weight shift, manual stabilization of LLE. Static standing x2 trials (x10-15 seconds each) with manual stabilization of LLE, manual facilitation (counterforce) at R ribcage, L axilla for postural normalization. Sit>supine with max A, HOB flat, and no rails; multimodal cueing for head positioning, technique, sequencing. Pt with good initiation of transfer but expresses anxiety with LUE weightbearing during during transition from sit>L side lying. Therapist departed with pt semi-reclined in bed with 3 bed rails up, bed alarm on husband present, and all needs within reach.  Therapy Documentation Precautions:  Precautions Precautions: Fall Precaution Comments: Lt field deficit and Lt inattention; LUE subluxation  Restrictions Weight Bearing Restrictions: No Vital Signs: Therapy Vitals Pulse Rate: 71 BP: 179/70 mmHg Pain: Pain Assessment Pain Assessment: No/denies pain Pain Score: 0-No pain NMR: Pt performed the following while seated EOB with close supervision-mod A, bilat UE/LE supported, manual stabilization of L scapula, manual facilitation of LUE weightbearing: M/L weight shifting x8 reps per direction; RUE reaching across midline x6 reps prior to posterior/R LOB; anterior reaching with RUE. Multimodal cueing focused on redirection of pt (secondary to distractibility), promoting head position at midline, self monitoring/correction of posterior/lateral trunk lean to R. Pt remained in seated x23 minutes total prior  to  fatigue, decreased postural stability  See FIM for current functional status  Therapy/Group: Individual Therapy  Kemyra August, Malva Cogan 04/19/2013, 12:23 PM

## 2013-04-19 NOTE — Progress Notes (Signed)
Subjective/Complaints: 63 y.o. 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 She was admitted on 04/08/2013 with dizziness, N/V as well as left-sided numbness and weakness. MRI of the brain showed acute infarct in the left cerebellum. MRA of the head with chronic right ICA artery occlusion and widely patent dominant right vertebral artery. Echocardiogram with ejection fraction of 60% no wall motion abnormalities. Carotid Dopplers showed left CEA patent and right vertebral artery flow is antegrade. Patient did not receive TPA. Neurology services consulted and recommended ASA for thrombotic stroke due to SVD--hold off on trial. maintained on socrates trial,ticagrelor versus aspirin for stroke prevention. Patient with severe HA as well as waxing and waning of left sided weakness with lability. MRI brain repeated 04/13/13 with new restricted diffusion entire R-PCA involving right occipital lobe, mesial and posterior right temporal lobe, mid brain and posterior right thalamus and slight increase in left cerebellar stroke. [name] done negative for LAA thrombus but extensive grade 4 bulky aortic debris and plaque in distal aorta that could be source of embolus--CT chest recommended to rule out penetrating ulcer-negative as below Implantable loop recorder placed by Dr. Lovena Le   Left shoulder pain Neck pain Objective: Vital Signs: Blood pressure 184/72, pulse 66, temperature 98.4 F (36.9 C), temperature source Oral, resp. rate 17, weight 91.173 kg (201 lb), SpO2 98.00%. Ct Angio Chest Aortic Dissect W &/or W/o  04/17/2013   CLINICAL DATA:  Extensive atheromatous plaque in the thoracic aorta. Evaluate for potential penetrating aortic ulcer.  EXAM: CT ANGIOGRAPHY CHEST WITH CONTRAST  TECHNIQUE: Multidetector CT imaging of the chest was performed using the  standard protocol during bolus administration of intravenous contrast. Multiplanar CT image reconstructions and MIPs were obtained to evaluate the vascular anatomy.  CONTRAST:  49m OMNIPAQUE IOHEXOL 350 MG/ML SOLN  COMPARISON:  No priors.  FINDINGS: Mediastinum: Precontrast images demonstrate no crescentic high attenuation associated with the wall of the thoracic aorta to suggest acute intramural hemorrhage at this time. Post-contrast images demonstrate extensive atheromatous plaque throughout the distal aortic arch and descending thoracic aorta. Some of these aortic plaques are ulcerated, best demonstrated on image 81 of series 5 in the distal descending thoracic aorta, but there is no frank penetrating aortic ulcer or signs of dissection at this time. Atheromatous plaque in the great vessels of the mediastinum is also noted, including either high-grade stenosis or complete occlusion of the proximal left subclavian artery. Flow on the distal left subclavian artery is noted, either related to incomplete obstruction, or collateral pathways. Heart size is borderline enlarged. There is no significant pericardial fluid, thickening or pericardial calcification. Calcifications of the mitral annulus. Small hiatal hernia. No pathologically enlarged mediastinal or hilar lymph nodes.  Lungs/Pleura: Linear opacities in the left lower lobe likely reflect chronic scarring. No acute consolidative airspace disease. No pleural effusions. No definite suspicious appearing pulmonary nodules or masses are identified.  Upper Abdomen: Unremarkable.  Musculoskeletal: There are no aggressive appearing lytic or blastic lesions noted in the visualized portions of the skeleton.  Review of the MIP images confirms the above findings.  IMPRESSION: 1. Extensive atheromatous plaque in the thoracic aorta and great vessels of the mediastinum. Although there are several ulcerated atheromatous plaques, there is no evidence of frank penetrating aortic  ulcer or thoracic aortic dissection at this time. 2. There is, however, either high-grade stenosis or complete occlusion of the proximal left subclavian  artery, with distal flow in the left subclavian artery related to either incomplete occlusion or collateralization. 3. Small hiatal hernia.   Electronically Signed   By: Vinnie Langton M.D.   On: 04/17/2013 16:32   Results for orders placed during the hospital encounter of 04/17/13 (from the past 72 hour(s))  GLUCOSE, CAPILLARY     Status: None   Collection Time    04/17/13  8:50 PM      Result Value Ref Range   Glucose-Capillary 89  70 - 99 mg/dL   Comment 1 Notify RN    CBC WITH DIFFERENTIAL     Status: None   Collection Time    04/18/13  6:48 AM      Result Value Ref Range   WBC 8.5  4.0 - 10.5 K/uL   RBC 4.26  3.87 - 5.11 MIL/uL   Hemoglobin 12.0  12.0 - 15.0 g/dL   HCT 37.0  36.0 - 46.0 %   MCV 86.9  78.0 - 100.0 fL   MCH 28.2  26.0 - 34.0 pg   MCHC 32.4  30.0 - 36.0 g/dL   RDW 13.9  11.5 - 15.5 %   Platelets 279  150 - 400 K/uL   Neutrophils Relative % 60  43 - 77 %   Neutro Abs 5.1  1.7 - 7.7 K/uL   Lymphocytes Relative 27  12 - 46 %   Lymphs Abs 2.3  0.7 - 4.0 K/uL   Monocytes Relative 11  3 - 12 %   Monocytes Absolute 0.9  0.1 - 1.0 K/uL   Eosinophils Relative 2  0 - 5 %   Eosinophils Absolute 0.2  0.0 - 0.7 K/uL   Basophils Relative 0  0 - 1 %   Basophils Absolute 0.0  0.0 - 0.1 K/uL  COMPREHENSIVE METABOLIC PANEL     Status: Abnormal   Collection Time    04/18/13  6:48 AM      Result Value Ref Range   Sodium 145  137 - 147 mEq/L   Potassium 3.6 (*) 3.7 - 5.3 mEq/L   Chloride 110  96 - 112 mEq/L   CO2 22  19 - 32 mEq/L   Glucose, Bld 126 (*) 70 - 99 mg/dL   BUN 33 (*) 6 - 23 mg/dL   Creatinine, Ser 1.76 (*) 0.50 - 1.10 mg/dL   Calcium 9.1  8.4 - 10.5 mg/dL   Total Protein 7.0  6.0 - 8.3 g/dL   Albumin 3.2 (*) 3.5 - 5.2 g/dL   AST 12  0 - 37 U/L   ALT 14  0 - 35 U/L   Alkaline Phosphatase 72  39 - 117 U/L    Total Bilirubin 0.3  0.3 - 1.2 mg/dL   GFR calc non Af Amer 30 (*) >90 mL/min   GFR calc Af Amer 35 (*) >90 mL/min   Comment: (NOTE)     The eGFR has been calculated using the CKD EPI equation.     This calculation has not been validated in all clinical situations.     eGFR's persistently <90 mL/min signify possible Chronic Kidney     Disease.  GLUCOSE, CAPILLARY     Status: Abnormal   Collection Time    04/18/13  7:26 AM      Result Value Ref Range   Glucose-Capillary 116 (*) 70 - 99 mg/dL   Comment 1 Notify RN    GLUCOSE, CAPILLARY     Status: Abnormal  Collection Time    04/18/13 11:35 AM      Result Value Ref Range   Glucose-Capillary 123 (*) 70 - 99 mg/dL   Comment 1 Notify RN    URINALYSIS, ROUTINE W REFLEX MICROSCOPIC     Status: Abnormal   Collection Time    04/18/13  1:37 PM      Result Value Ref Range   Color, Urine YELLOW  YELLOW   APPearance CLEAR  CLEAR   Specific Gravity, Urine 1.031 (*) 1.005 - 1.030   pH 5.5  5.0 - 8.0   Glucose, UA NEGATIVE  NEGATIVE mg/dL   Hgb urine dipstick NEGATIVE  NEGATIVE   Bilirubin Urine NEGATIVE  NEGATIVE   Ketones, ur NEGATIVE  NEGATIVE mg/dL   Protein, ur 100 (*) NEGATIVE mg/dL   Urobilinogen, UA 0.2  0.0 - 1.0 mg/dL   Nitrite NEGATIVE  NEGATIVE   Leukocytes, UA NEGATIVE  NEGATIVE  URINE MICROSCOPIC-ADD ON     Status: Abnormal   Collection Time    04/18/13  1:37 PM      Result Value Ref Range   WBC, UA 0-2  <3 WBC/hpf   Bacteria, UA FEW (*) RARE   Casts HYALINE CASTS (*) NEGATIVE   Crystals URIC ACID CRYSTALS (*) NEGATIVE  GLUCOSE, CAPILLARY     Status: Abnormal   Collection Time    04/18/13  4:50 PM      Result Value Ref Range   Glucose-Capillary 110 (*) 70 - 99 mg/dL   Comment 1 Notify RN    GLUCOSE, CAPILLARY     Status: Abnormal   Collection Time    04/18/13  8:38 PM      Result Value Ref Range   Glucose-Capillary 130 (*) 70 - 99 mg/dL   Comment 1 Notify RN        General: No acute distress Mood and affect  are appropriate Heart: Regular rate and rhythm no rubs murmurs or extra sounds Lungs: Clear to auscultation, breathing unlabored, no rales or wheezes Abdomen: Positive bowel sounds, soft nontender to palpation, nondistended Extremities: No clubbing, cyanosis, or edema Skin: No evidence of breakdown, no evidence of rash Neurologic: Cranial nerves II through XII intact, motor strength is 5/5 in Right deltoid, bicep, tricep, grip, hip flexor, knee extensors, ankle dorsiflexor and plantar flexor 0/5 on left side Sensory exam normal sensation to light touch and proprioception in right upper and lower extremities, feels deep pressure on left side Cerebellar exam cannot assess on left secondary to weakness Musculoskeletal: Pain to palp Left subacromial and pain with ROM No joint swelling Right gaze preference  Assessment/Plan: 1. Functional deficits secondary to Right PCA infarct with Left HP, L HH which require 3+ hours per day of interdisciplinary therapy in a comprehensive inpatient rehab setting. Physiatrist is providing close team supervision and 24 hour management of active medical problems listed below. Physiatrist and rehab team continue to assess barriers to discharge/monitor patient progress toward functional and medical goals. FIM: FIM - Bathing Bathing Steps Patient Completed: Chest;Right upper leg;Left upper leg;Abdomen Bathing: 2: Max-Patient completes 3-4 60f10 parts or 25-49%  FIM - Upper Body Dressing/Undressing Upper body dressing/undressing steps patient completed: Put head through opening of pull over shirt/dress Upper body dressing/undressing: 2: Max-Patient completed 25-49% of tasks FIM - Lower Body Dressing/Undressing Lower body dressing/undressing: 1: Total-Patient completed less than 25% of tasks        FIM - BEngineer, siteAssistive Devices: Arm rests;HOB elevated;Bed rails Bed/Chair Transfer: 2:  Bed > Chair or W/C: Max A (lift and lower  assist);3: Supine > Sit: Mod A (lifting assist/Pt. 50-74%/lift 2 legs;3: Sit > Supine: Mod A (lifting assist/Pt. 50-74%/lift 2 legs);3: Chair or W/C > Bed: Mod A (lift or lower assist)  FIM - Locomotion: Wheelchair Distance: 16 Locomotion: Wheelchair: 1: Travels less than 50 ft with maximal assistance (Pt: 25 - 49%) FIM - Locomotion: Ambulation Locomotion: Ambulation Assistive Devices: Other (comment) (R hallway hand rail) Ambulation/Gait Assistance: 1: +2 Total assist Locomotion: Ambulation: 1: Two helpers (w/c follow)  Comprehension Comprehension Mode: Auditory Comprehension: 4-Understands basic 75 - 89% of the time/requires cueing 10 - 24% of the time  Expression Expression Mode: Verbal Expression: 5-Expresses basic 90% of the time/requires cueing < 10% of the time.  Social Interaction Social Interaction: 2-Interacts appropriately 25 - 49% of time - Needs frequent redirection.  Problem Solving Problem Solving: 3-Solves basic 50 - 74% of the time/requires cueing 25 - 49% of the time  Memory Memory: 3-Recognizes or recalls 50 - 74% of the time/requires cueing 25 - 49% of the time  Medical Problem List and Plan:  left cerebellar infarct with extension and new R-PCA infarct affecting right occipital lobe, mesial and posterior right temporal lobe, mid brain and posterior right thalamus  -currently on ECASA and plavix for seconary stroke prevention  1. DVT Prophylaxis/Anticoagulation: SCD's, sq heparin. Needs lower ext dopplers upon admit  2. Fibromyalgia/Lleft shoulder pain due to subluxation/ Pain Management: Positioning as patient with LUE neglect. Will add voltaren gel and tylenol prn. Kpad, sportscreme 3. Anxiety disorder/ Mood: Poor awareness with apathy noted. Trial ritalin LCSW to follow for support and evaluation.  4. Neuropsych: This patient is not capable of making decisions on her own behalf.  5. HTN: will monitor with bid checks. Continue catapress, atenolol, norvasc and  hydralazine. Elevated systolic, adjust meds 6. Borderline diabetes: Hgb A1c-6.3/MFG-134. CM diet restrictions. SSI for elevated BS---most thickened liquids are sweetened.  7: Dyslipidemia: Continue zocor  8. CKD stage 3: Will monitor with serial checks.  9. Dysphagia: continue D2,nectar liquids with aspiration precautions. IVF at bedtime to maintain adequate hydration. Patient/family non-compliant--continue to educate on aspiration risk.  10. Constipation: Continue miralax and senna.  11. Hyperactive bladder: check PVRs, check UA/UCS.    LOS (Days) 2  A FACE TO FACE EVALUATION WAS PERFORMED  KIRSTEINS,ANDREW E 04/19/2013, 7:37 AM

## 2013-04-19 NOTE — Procedures (Signed)
Objective Swallowing Evaluation: Modified Barium Swallowing Study  Patient Details  Name: Kelli [name] MRN: [id number] Date of Birth: 1950/03/13  Today's Date: 04/19/2013 Time: [id number] Time Calculation (min): 32 min  Past Medical History:  Past Medical History  Diagnosis Date  . Essential hypertension, benign   . Fibromyalgia   . Cerebral infarction Aug 2014    Bihemispheric watershed infarcts  . Mixed hyperlipidemia   . Borderline diabetes   . Urinary incontinence   . Carotid artery occlusion     Occluded RICA, status post left CEA  August 2014 - Dr. Donnetta Hutching  . CKD (chronic kidney disease) stage 3, GFR 30-59 ml/min   . 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 Urethral dilation  1980's  . Combined hysterectomy vaginal w/ mmk / a&p repair  1981  . Vaginal hysterectomy  1981    "partial" (10/04/2012)  . Endarterectomy Left 10/06/2012    Procedure: Carotid Endarterectomy with Finesse patch angioplasty;  Surgeon: [name], MD;  Location: [place];  Service: Vascular;  Laterality: Left;  . Loop recorder implant  04/16/13    MDT LinQ implanted for cryptogenic stroke  . [name] without cardioversion N/A 04/16/2013    Procedure: TRANSESOPHAGEAL ECHOCARDIOGRAM ([name]);  Surgeon: Josue Hector, MD;  Location: [facility] ENDOSCOPY;  Service: Cardiovascular;  Laterality: N/A;   HPI:  Kelli [name] is an 63 y.o. female a history of hypertension, hyperlipidemia, watershed cerebral infarctions, right ICA occlusion and left CEA, presenting with new onset vertigo with nausea and vomiting as well as numbness involving left side of face and left upper extremity and left upper extremity weakness. Onset of symptoms was at 2:30 PM today 04/08/2013. Patient has been taking aspirin 81 mg per day per chart review.  She presented to the emergency room at [facility] initially. She  was noted initially deemed a candidate for TPA. However, she became worse with increasing nausea and vomiting as well as vertigo and worsening of numbness and left-sided weakness. Speech also became somewhat slurred. CTA was recommended by Specialist on-Call neurologist. She was subsequently transferred here for MRI and MRA study to rule out basal artery thrombosis. MRI showed findings consistent with acute small left cerebellar ischemic infarction. There is no evidence of basilar artery occlusion or significant stenosis. No evidence of thrombus was seen. NIH stroke score on arrival to [facility] was 5. Patient had dark colored emesis which was guaiac positive. Patient was not administerd TPA.  Pt had speech evaluation yesterday and was noted to be coughing with intake, which pt states has occured since hospitalization.  Bedside evaluation completed yesterday and diet modified to puree/nectar.  Poor intake ongoing due to dysphagia, lethargy and displeasure with po diet.  Pt with worsening neuro symptoms and MRI reveales new, acute right PCA territory infarction.     Recommendation/Prognosis  Clinical Impression:   Dysphagia Diagnosis: Moderate oral phase dysphagia;Moderate pharyngeal phase dysphagia Clinical impression: Pt presents with a moderate oropharyngeal sensorimotor based dysphagia, which is likely further exacerbated by current lethargy and cognitive status. Pt demonstrated oral holding with Mod cues to orally propel boluses, with decreased lingual manipulation and cohesion resulting in oral residuals as well as premature spillage to the level of the pyriforms. Pt's sensory deficits resulted in delayed swallow initiation up to 13 seconds from initial premature spillage from oral cavity. Pt's timing impairment  and decreased sensation resulted in silent aspiration before the swallow with every liquid bolus, despite attempted compensatory strategies. Continue to recommend Dys 2 textures and nectar thick  liquids. In order to facilitate hydration and compliance, pt may have ice chips prn per the water protocol.   Swallow Evaluation Recommendations:  Diet Recommendations: Nectar-thick liquid;Dysphagia 2 (Fine chop) Liquid Administration via: Cup;No straw Medication Administration: Crushed with puree Supervision: Full supervision/cueing for compensatory strategies;Patient able to self feed Compensations: Slow rate;Small sips/bites;Check for pocketing Postural Changes and/or Swallow Maneuvers: Seated upright 90 degrees;Upright 30-60 min after meal Oral Care Recommendations: Oral care BID Other Recommendations: Order thickener from pharmacy;Prohibited food (jello, ice cream, thin soups);Remove water pitcher;Have oral suction available Follow up Recommendations: Home health SLP;24 hour supervision/assistance    Prognosis:  Prognosis for Safe Diet Advancement: Fair Barriers to Reach Goals: Cognitive deficits;Behavior   Individuals Consulted: Consulted and Agree with Results and Recommendations: Patient      SLP Assessment/Plan  Plan:  Speech Therapy Frequency: min 5x/week Potential to Achieve Goals: Fair Potential Considerations: Cooperation/participation level    Short Term Goals: Week 1: SLP Short Term Goal 1 (Week 1): Pt will utilize safe swallowing strategies with current diet with Min cues SLP Short Term Goal 2 (Week 1): Pt attend to her left visual field during basic functional task with Mod cues SLP Short Term Goal 3 (Week 1): Pt will sustain attention to basic functional task for 5 minutes with Mod cues SLP Short Term Goal 4 (Week 1): Pt will utilize environmental cues to demonstrate orientation x4 with Mod cues SLP Short Term Goal 5 (Week 1): Pt will demonstrate adequate problem solving during basic functional task with Mod cues SLP Short Term Goal 6 (Week 1): Pt will demonstrate effective mastication and oral clearance with Dys 3 textures with Min cues    General: Date of  Onset: 04/08/13 Type of Study: Modified Barium Swallowing Study Reason for Referral: Objectively evaluate swallowing function Previous Swallow Assessment: Most recent BSE 2/18 recommending Dys 2 and nectar thick liquids Diet Prior to this Study: Dysphagia 2 (chopped);Nectar-thick liquids (pt admits to being noncompliant with diet recommendations, having family bring her thin liquids, ice cream, and Dys 3-regular textures) Temperature Spikes Noted: No Respiratory Status: Room air History of Recent Intubation: No Behavior/Cognition: Cooperative;Requires cueing;Confused;Lethargic Oral Cavity - Dentition: Adequate natural dentition Oral Motor / Sensory Function: Impaired - see Bedside swallow eval Self-Feeding Abilities: Needs assist Patient Positioning: Upright in chair Baseline Vocal Quality: Clear Volitional Cough: Weak Volitional Swallow: Unable to elicit Anatomy: Within functional limits Pharyngeal Secretions: Not observed secondary MBS   Reason for Referral:   Objectively evaluate swallowing function    Oral Phase: Oral Preparation/Oral Phase Oral Phase: Impaired Oral - Nectar Oral - Nectar Cup: Weak lingual manipulation;Reduced posterior propulsion;Holding of bolus;Lingual/palatal residue;Delayed oral transit;Other (Comment) (decreased bolus cohesion) Oral - Thin Oral - Thin Teaspoon: Weak lingual manipulation;Reduced posterior propulsion;Holding of bolus;Lingual/palatal residue;Delayed oral transit;Other (Comment) (decreased bolus cohesion) Oral - Thin Cup: Weak lingual manipulation;Reduced posterior propulsion;Holding of bolus;Lingual/palatal residue;Delayed oral transit;Other (Comment) (decreased bolus cohesion) Oral - Solids Oral - Puree: Weak lingual manipulation;Reduced posterior propulsion;Holding of bolus;Lingual/palatal residue;Delayed oral transit;Other (Comment) (decreased bolus cohesion) Oral - Mechanical Soft: Weak lingual manipulation;Reduced posterior  propulsion;Holding of bolus;Lingual/palatal residue;Delayed oral transit;Other (Comment);Impaired mastication (decreased bolus cohesion)   Pharyngeal Phase:  Pharyngeal Phase Pharyngeal Phase: Impaired Pharyngeal - Nectar Pharyngeal - Nectar Cup: Premature spillage to pyriform sinuses;Delayed swallow initiation;Reduced tongue base retraction;Penetration/Aspiration before swallow;Pharyngeal residue - valleculae Penetration/Aspiration details (nectar  cup): Material enters airway, remains ABOVE vocal cords then ejected out Pharyngeal - Thin Pharyngeal - Thin Teaspoon: Delayed swallow initiation;Premature spillage to pyriform sinuses;Reduced tongue base retraction;Penetration/Aspiration before swallow;Pharyngeal residue - valleculae Penetration/Aspiration details (thin teaspoon): Material enters airway, passes BELOW cords without attempt by patient to eject out (silent aspiration) Pharyngeal - Thin Cup: Delayed swallow initiation;Premature spillage to pyriform sinuses;Reduced tongue base retraction;Penetration/Aspiration before swallow;Pharyngeal residue - valleculae;Compensatory strategies attempted (Comment) (chin tuck) Penetration/Aspiration details (thin cup): Material enters airway, passes BELOW cords without attempt by patient to eject out (silent aspiration) Pharyngeal - Solids Pharyngeal - Puree: Delayed swallow initiation;Premature spillage to pyriform sinuses;Reduced tongue base retraction;Pharyngeal residue - valleculae Pharyngeal - Mechanical Soft: Premature spillage to valleculae;Delayed swallow initiation;Reduced tongue base retraction;Pharyngeal residue - valleculae   Cervical Esophageal Phase  Cervical Esophageal Phase Cervical Esophageal Phase: [facility]     Germain Osgood, M.A. CCC-SLP [phone]  Germain Osgood 04/19/2013, 1:17 PM

## 2013-04-19 NOTE — Progress Notes (Signed)
Patient needing urine culture. Attempted 3x (Nurse and NT) to get sample, but patient refused stated it was done yesterday, 04/18/2012. On coming nurse made aware. Jigar Zielke A. Trecia Maring, RN

## 2013-04-19 NOTE — Patient Care Conference (Signed)
Inpatient RehabilitationTeam Conference and Plan of Care Update Date: 04/18/2013   Time: 10:50 AM    Patient Name: Kelli [name]      Medical Record Number: [id number]  Date of Birth: May 02, 1950 Sex: Female         Room/Bed: 4W22C/4W22C-01 Payor Info: Payor: Theme park manager / Plan: Theme park manager / Product Type: *No Product type* /    Admitting Diagnosis: CVA  Admit Date/Time:  04/17/2013  5:44 PM Admission Comments: No comment available   Primary Diagnosis:  <principal problem not specified> Principal Problem: <principal problem not specified>  Patient Active Problem List   Diagnosis Date Noted  . Acute CVA (cerebrovascular accident): R PCA infarct per MRI 04/13/13 04/13/2013  . CVA (cerebral infarction) 04/08/2013  . Acute ischemic stroke 04/08/2013  . Diplopia 04/08/2013  . Left-sided weakness 04/08/2013  . Vertigo 04/08/2013  . Paresthesias in left hand 04/08/2013  . Pre-diabetes 04/08/2013  . Cerebellar stroke 04/08/2013  . Dizziness and giddiness 04/08/2013  . Stroke 04/08/2013  . Depression 04/06/2013  . Peripheral arterial disease 03/16/2013  . History of stroke 01/30/2013  . Precordial pain 01/16/2013  . CKD (chronic kidney disease) stage 3, GFR 30-59 ml/min 01/16/2013  . Anxiety state, unspecified 12/08/2012  . Lumbago 12/08/2012  . Urinary frequency 11/14/2012  . Occlusion and stenosis of carotid artery with cerebral infarction 10/05/2012  . Mixed hyperlipidemia 10/05/2012  . Renal insufficiency 10/05/2012  . Hypertension, uncontrolled 10/04/2012  . Tobacco abuse 10/04/2012  . Obesity 10/04/2012    Expected Discharge Date: Expected Discharge Date: 05/12/13  Team Members Present: Physician leading conference: Dr. Alysia Penna Social Worker Present: Alfonse Alpers, LCSW Nurse Present: Janyth Pupa, RN PT Present: Georjean Mode, PT;Other (comment) Benjie Karvonen Hobble, PT) OT Present: Gareth Morgan, Lorelee Cover, OT SLP Present: Germain Osgood,  SLP PPS Coordinator present : Daiva Nakayama, RN, CRRN;Becky Alwyn Ren, PT     Current Status/Progress Goal Weekly Team Focus  Medical   severe dysphagia, poor awareness of Left side, severe weakness  safe po intact , cont B and B  initiate treatment program   Bowel/Bladder   incontinent b/b, LBM 2/14  max assist  manage with incontinent aid   Swallow/Nutrition/ Hydration   Mod cues for Dys 2 textures and nectar thick liquids  supervision with least restrictive PO  trials of advanced textures, objective testing   ADL's   max-total assist functional transfers and self-care tasks; L inattention/neglect noted  min-mod assist overall  L NMR, postural control in sitting and standing, sitting balance, attention to L, strengthening, activity tolerance.   Mobility   Max-Total A with transfers, +2Total A with gait x5', stairs unsafe at this time  Min A overall  Transfer training, activity tolerance, initiate w/c mobility, gait training, L-sided attention   Communication             Safety/Cognition/ Behavioral Observations  disoriented to time, decreased intellectual awareness, sustained attention, basic problem solving  supervision  orientation, sustained attention, problem solving   Pain   c/o burning buttocks relieve with repositioning  pain <3  turn Q@H  in bed   Skin   buttocks reddened, bruise R upper arm  no new breakdown  educate family on skin care    Rehab Goals Patient on target to meet rehab goals: Yes Rehab Goals Revised: This is patient's first conference and goals are just being established. *See Care Plan and progress notes for long and short-term goals.  Barriers to Discharge: will be heavy assist, will need sig  family assist    Possible Resolutions to Barriers:  See above, MBS this wk    Discharge Planning/Teaching Needs:  Pt's husband is working on hiring private caregivers in the home for pt when she is d/c'd.  Family will attend family education as needed closer to  d/c.   Team Discussion:  Pt is currently requiring max/totatl assist with all therapies and will need a 3-4 week stay to reach minimal assistance level.  SLF to do a MBS 04-19-13 to assess swallowing, as pt is wanting water and had been non-compliant with swallowing precautions on acute.  Pt is also not able to initiate letting staff know of need for toileting, so she is incontinent.    Revisions to Treatment Plan:  None   Continued Need for Acute Rehabilitation Level of Care: The patient requires daily medical management by a physician with specialized training in physical medicine and rehabilitation for the following conditions: Daily direction of a multidisciplinary physical rehabilitation program to ensure safe treatment while eliciting the highest outcome that is of practical value to the patient.: Yes Daily medical management of patient stability for increased activity during participation in an intensive rehabilitation regime.: Yes Daily analysis of laboratory values and/or radiology reports with any subsequent need for medication adjustment of medical intervention for : Neurological problems  Kimerly Rowand, Silvestre Mesi 04/19/2013, 2:36 PM

## 2013-04-19 NOTE — Progress Notes (Signed)
Occupational Therapy Session Note  Patient Details  Name: Kelli [name] MRN: [id number] Date of Birth: 1950-06-16  Today's Date: 04/19/2013 Time: [id number] and [id number] Time Calculation (min): 60 min and 23 min   Short Term Goals: Week 1:  OT Short Term Goal 1 (Week 1): Patient will tolerate sitting EOB unsupport for 15 seconds with mod assist OT Short Term Goal 2 (Week 1): Patient will locate self-care item on left side with mod cues OT Short Term Goal 3 (Week 1): Patient will complete bathing task with mod assist OT Short Term Goal 4 (Week 1): Patient will complete toilet transfer to BSC/toilet with max assist consistently  Skilled Therapeutic Interventions/Progress Updates:    Session 1: Pt seen for ADL retraining with focus on attention to L, bed mobility, and postural control in sitting. Pt consistently asking for water throughout session requiring max cues to re-direct and attend to task. Discussed swallowing deficits impacting ability to safely swallow water. Provided discussion on not allowing family bring in food/drink that is not on her diet as well. Pt declining full bathing and allowing therapist to complete buttocks hygiene. Pt required mod assist for rolling to left with max cues for eye opening and locating bed rail. Total assist when rolling to right however pt would reach for LUE when cued to. Provided cervical ROM as pt was right gaze with head turn and no c/o pain during this. Pt sat EOB with mod-total assist and improved to demonstrating minimal balance reactions as pt demonstrating trunk lean to left. Max cues for eye opening throughout session and attempted planned failure with donning shirt as pt threading RUE incorrectly multiple times. Presented clothing items at midline and pt identified color of them 50% of time. Pt visually attended to LUE when threading sleeve with cues for approx 2-3 seconds. Pt required total assist for sit<>stand and assist for anterior weight shift.  Completed squat pivot transfer to right side max assist and total assist when transferring to L side. Pt requesting drink and therapist provided nectar thick liquid. Pt taking 2 sips and declining any more. Pt required cues for swallowing. At end of session pt left in bed with HOB elevated. All needs in reach.   Session 2: Pt received supine in bed requiring max cues and external factors (lighting) to arouse briefly. Pt initially declining therapy due to fatigue then willing to participate with encouragement. Focused session on rolling in bed for trunk stability and strengthening to facilitate bed mobility and postural control in sitting. Pt required mod cues for technique and carryover throughout session. Max cues for head turn to left to locate bed rail as well. Pt required total assist when rolling to L and mod-max assist when rolling to right. Pt locating LUE during rolling however limited attention to this approx 5 sec. At end of session pt left supine in bed with NT present.   Therapy Documentation Precautions:  Precautions Precautions: Fall Precaution Comments: Lt field deficit and Lt inattention; LUE subluxation  Restrictions Weight Bearing Restrictions: No General:   Vital Signs: Therapy Vitals Pulse Rate: 71 BP: 179/70 mmHg Pain: Pt reporting pain in LUE. Therapist assisted with positioning.  See FIM for current functional status  Therapy/Group: Individual Therapy  Duayne Cal 04/19/2013, 12:02 PM

## 2013-04-19 NOTE — Progress Notes (Signed)
Social Work Assessment and Plan  Patient Details  Name: Kelli [name] MRN: [id number] Date of Birth: 12-22-1950  Today's Date: 04/19/2013  Problem List:  Patient Active Problem List   Diagnosis Date Noted  . Acute CVA (cerebrovascular accident): R PCA infarct per MRI 04/13/13 04/13/2013  . CVA (cerebral infarction) 04/08/2013  . Acute ischemic stroke 04/08/2013  . Diplopia 04/08/2013  . Left-sided weakness 04/08/2013  . Vertigo 04/08/2013  . Paresthesias in left hand 04/08/2013  . Pre-diabetes 04/08/2013  . Cerebellar stroke 04/08/2013  . Dizziness and giddiness 04/08/2013  . Stroke 04/08/2013  . Depression 04/06/2013  . Peripheral arterial disease 03/16/2013  . History of stroke 01/30/2013  . Precordial pain 01/16/2013  . CKD (chronic kidney disease) stage 3, GFR 30-59 ml/min 01/16/2013  . Anxiety state, unspecified 12/08/2012  . Lumbago 12/08/2012  . Urinary frequency 11/14/2012  . Occlusion and stenosis of carotid artery with cerebral infarction 10/05/2012  . Mixed hyperlipidemia 10/05/2012  . Renal insufficiency 10/05/2012  . Hypertension, uncontrolled 10/04/2012  . Tobacco abuse 10/04/2012  . Obesity 10/04/2012   Past Medical History:  Past Medical History  Diagnosis Date  . Essential hypertension, benign   . Fibromyalgia   . Cerebral infarction Aug 2014    Bihemispheric watershed infarcts  . Mixed hyperlipidemia   . Borderline diabetes   . Urinary incontinence   . Carotid artery occlusion     Occluded RICA, status post left CEA  August 2014 - Dr. Donnetta Hutching  . CKD (chronic kidney disease) stage 3, GFR 30-59 ml/min   . 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 Urethral dilation  1980's  . Combined hysterectomy vaginal w/ mmk / a&p repair  1981  . Vaginal hysterectomy  1981    "partial" (10/04/2012)  . Endarterectomy Left 10/06/2012   Procedure: Carotid Endarterectomy with Finesse patch angioplasty;  Surgeon: [name], MD;  Location: [facility];  Service: Vascular;  Laterality: Left;  . Loop recorder implant  04/16/13    MDT LinQ implanted for cryptogenic stroke  . [name] without cardioversion N/A 04/16/2013    Procedure: TRANSESOPHAGEAL ECHOCARDIOGRAM ([name]);  Surgeon: Josue Hector, MD;  Location: [facility] ENDOSCOPY;  Service: Cardiovascular;  Laterality: N/A;   Social History:  reports that she quit smoking about 6 months ago. Her smoking use included Cigarettes. She has a 44 pack-year smoking history. She has never used smokeless tobacco. She reports that she does not drink alcohol or use illicit drugs.  Family / Support Systems Marital Status: Married How Long?: 40 years Patient Roles: Other (Comment);Parent (sister) Spouse/Significant Other: Dameisha Tschida [phone] (h) [phone] (m) Children: Reyne Falconi - son [phone]  1 sons lives with pt and husband.  Other 2 sons live within 5 minutes. Other Supports: sister Warren Lacy Damita Dunnings) and other family members/friends;  close knit community Anticipated Caregiver: youngest son and potentially hired caregivers/friends while husband works vs. facility care, if warranted Ability/Limitations of Caregiver: Husband is working during the day, but has already begun Stat[employer]ian 24 hour caregivers. Caregiver Availability: 24/7 Family Dynamics: close, supportive family  Social History Preferred language: English Religion: Patient Refused Education: college - social work Read: Yes Write: Yes Employment Status: Retired Date Retired/Disabled/Unemployed: "a few years ago" Age Retired: 81 Freight forwarder Issues: None reported Guardian/Conservator: N/A   Abuse/Neglect Physical Abuse: Denies Verbal Abuse: Denies Sexual Abuse: Denies Exploitation of  patient/patient's resources: Denies Self-Neglect: Denies  Emotional Status Pt's affect, behavior and adjustment status: Pt was  very lethargic during CSW's visit, but remembered CSW by voice and name from pt's previous admission to CIR.  CSW will continue to assess pt for affect and adjustment status. Recent Psychosocial Issues: Pt with CIR admission in August 2014. Pyschiatric History: Pt has a hx of anxiety.  In August 2014, pt admitted to being prescribed an anti-depressant/anti-anxiety medication that she had chosen to not take.  She was experiencing some anxiety and depression at the time of that stroke and was considering taking the medication when she was d/c'd.  Pt has sought counseling in the past for anxiety and depression and found it helpful. Substance Abuse History: None reported  Patient / Family Perceptions, Expectations & Goals Pt/Family understanding of illness & functional limitations: Pt's husband is aware of pt's condition and her need for CIR, as well as 24/7 caregivers when she is d/c'd.  He is already planning on hiring caregivers, if needed, or looking into a facility for pt if she needs more care than can be provided at home. Premorbid pt/family roles/activities: Pt enjoys spending time with friends and family.  Pt likes sports, especially baseball. Anticipated changes in roles/activities/participation: Pt has good family support to assist with any roles/responsibilities pt was filling prior to this second stroke. Pt/family expectations/goals: Pt was not able to clearly identify her rehab goal during CSW's visit.  Pt's husband wants pt to get the rehab she needs while at [facility] and then he will make sure she has what she needs when she is d/c'd.  Community Resources [employer]: Other (Comment) ([facility] for outpt PT/OT) Premorbid Home Care/DME Agencies: Other (Comment) (transfer tub bench from [place]) Transportation available at discharge: family Resource referrals recommended: Neuropsychology;Support group (specify)  Discharge Planning Living  Arrangements: Spouse/significant other;Other relatives Support Systems: Spouse/significant other;Children;Other relatives;Friends/neighbors Type of Residence: Private residence Insurance Resources: Multimedia programmer (specify) (Theme park manager) Pensions consultant: Employment (Husband is an Agricultural consultant for [employer] projects) Financial Screen Referred: No Money Management: Spouse Does the patient have any problems obtaining your medications?: No Home Management: pt's family can manage Patient/Family Preliminary Plans: Pt's husband is wanting to wait until closer to d/c to determine d/c plan, but he will either hire 24/7 caregivers for pt at their home or find pt a SNF or other appropriate care facility. Barriers to Discharge: Steps (3 to enter the home) Social Work Anticipated Follow Up Needs: HH/OP;Support Group Expected length of stay: 3-4 weeks  Clinical Impression CSW met briefly with pt, as SLP was in pt's room ahead of schedule and really needed to work with pt.  CSW was able to speak with pt and she remembered and acknowledged CSW from her previous CIR stay.  Pt was very lethargic and would not open her eyes unless instructed to do so.  CSW told pt that CSW would follow her through her stay and work with her husband to assure pt had what she needed when she went home.  CSW then spoke with husband via telephone who has already begun investigating caregivers to hire for pt at home while he is working.  Husband also mentioned that he wants pt to have the best care and what she needs, even if it's not close to home.  CSW was unable to determine if husband was talking about CIR or a care facility after CIR, as he then began talking about hiring caregivers when she comes  home.  CSW will continue to talk with husband and pt (when she is able to participate more) about plan for pt when her rehab stay is complete.  Targeted d/c date is 05-12-13 and CSW made husband aware of this.  CSW will continue to follow  and assist pt/family as needed.  Greogry Goodwyn, Silvestre Mesi 04/19/2013, 2:09 PM

## 2013-04-20 ENCOUNTER — Encounter (HOSPITAL_COMMUNITY): Payer: 59

## 2013-04-20 ENCOUNTER — Inpatient Hospital Stay (HOSPITAL_COMMUNITY): Payer: 59

## 2013-04-20 ENCOUNTER — Inpatient Hospital Stay (HOSPITAL_COMMUNITY): Payer: 59 | Admitting: Physical Therapy

## 2013-04-20 ENCOUNTER — Inpatient Hospital Stay (HOSPITAL_COMMUNITY): Payer: 59 | Admitting: Speech Pathology

## 2013-04-20 DIAGNOSIS — F411 Generalized anxiety disorder: Secondary | ICD-10-CM

## 2013-04-20 DIAGNOSIS — I10 Essential (primary) hypertension: Secondary | ICD-10-CM

## 2013-04-20 DIAGNOSIS — I6330 Cerebral infarction due to thrombosis of unspecified cerebral artery: Secondary | ICD-10-CM

## 2013-04-20 DIAGNOSIS — N183 Chronic kidney disease, stage 3 unspecified: Secondary | ICD-10-CM

## 2013-04-20 DIAGNOSIS — I69991 Dysphagia following unspecified cerebrovascular disease: Secondary | ICD-10-CM

## 2013-04-20 LAB — URINALYSIS, ROUTINE W REFLEX MICROSCOPIC
Bilirubin Urine: NEGATIVE
Glucose, UA: NEGATIVE mg/dL
Hgb urine dipstick: NEGATIVE
KETONES UR: NEGATIVE mg/dL
Leukocytes, UA: NEGATIVE
NITRITE: NEGATIVE
PH: 5.5 (ref 5.0–8.0)
PROTEIN: 100 mg/dL — AB
Specific Gravity, Urine: 1.021 (ref 1.005–1.030)
Urobilinogen, UA: 0.2 mg/dL (ref 0.0–1.0)

## 2013-04-20 LAB — BASIC METABOLIC PANEL
BUN: 22 mg/dL (ref 6–23)
CALCIUM: 8.9 mg/dL (ref 8.4–10.5)
CO2: 22 meq/L (ref 19–32)
Chloride: 109 mEq/L (ref 96–112)
Creatinine, Ser: 1.46 mg/dL — ABNORMAL HIGH (ref 0.50–1.10)
GFR calc Af Amer: 43 mL/min — ABNORMAL LOW (ref >90)
GFR, EST NON AFRICAN AMERICAN: 37 mL/min — AB (ref >90)
Glucose, Bld: 127 mg/dL — ABNORMAL HIGH (ref 70–99)
Potassium: 3.9 mEq/L (ref 3.7–5.3)
SODIUM: 145 meq/L (ref 137–147)

## 2013-04-20 LAB — URINE CULTURE
CULTURE: NO GROWTH
Colony Count: NO GROWTH

## 2013-04-20 LAB — URINE MICROSCOPIC-ADD ON

## 2013-04-20 LAB — GLUCOSE, CAPILLARY
GLUCOSE-CAPILLARY: 120 mg/dL — AB (ref 70–99)
GLUCOSE-CAPILLARY: 116 mg/dL — AB (ref 70–99)
Glucose-Capillary: 117 mg/dL — ABNORMAL HIGH (ref 70–99)
Glucose-Capillary: 102 mg/dL — ABNORMAL HIGH (ref 70–99)

## 2013-04-20 MED ORDER — HYDRALAZINE HCL 25 MG PO TABS
25.0000 mg | ORAL_TABLET | Freq: Two times a day (BID) | ORAL | Status: DC
  Administered 2013-04-20 – 2013-04-22 (×5): 25 mg via ORAL
  Filled 2013-04-20 (×7): qty 1

## 2013-04-20 MED ORDER — METHYLPHENIDATE HCL 5 MG PO TABS
10.0000 mg | ORAL_TABLET | Freq: Two times a day (BID) | ORAL | Status: DC
  Administered 2013-04-20 – 2013-04-30 (×22): 10 mg via ORAL
  Filled 2013-04-20 (×22): qty 2

## 2013-04-20 NOTE — Progress Notes (Signed)
[name] blood pressure monitored at [place], [place]. Readings: 190/90, 133/68...Marland KitchenMarland KitchenWaade A. Tajae Maiolo, RN. Report will be given to on-coming nurse. Will continue to monitor. Sher Hellinger A. Lucianne Smestad, RN.

## 2013-04-20 NOTE — Progress Notes (Signed)
B/P rechecked at 0037: 176/62. Will monitor.Marland KitchenMarland KitchenMarland KitchenWaade A. Caprice Wasko

## 2013-04-20 NOTE — Progress Notes (Addendum)
Physical Therapy Session Note  Patient Details  Name: Kelli [name] MRN: [id number] Date of Birth: 1950-03-18  Today's Date: 04/20/2013 Time: 0900-0959  Time Calculation (min): 59 min   Short Term Goals: Week 1:  PT Short Term Goal 1 (Week 1): Pt to consistently perform supine<>sit with mod A with HOB flat using bed rail. PT Short Term Goal 2 (Week 1): Pt to consistently perform bed<>chair transfer with mod A. PT Short Term Goal 3 (Week 1): Pt to perform w/c mobility x50' in controlled environment with mod A. PT Short Term Goal 4 (Week 1): Pt to perform sit<>stand with mod A. PT Short Term Goal 5 (Week 1): Pt to perform gait x15' in controlled environment with +1Total A.  Skilled Therapeutic Interventions/Progress Updates:    Treatment Session 1: 2:1.Pt received semi-reclined in bed; agreeable to session. Supine>sit with HOB elevated, bed rail, mod A for LLE management, trunk positioning.  Seated EOB x14 min total with LUE support on bed, intermittent manual repositioning of LLE secondary to inattention. While in seated, scooting to R side with max A, manual facilitation of anterior weight shift, manual stabilization of LLE; static sitting with bilat UE support, SBA to min A and frequent verbal cues for midline posture; oral hygiene using RUE, per pt request to have ice chips.   Performed squat pivot from bed>chair with max A, manual stabilization of LLE, mod verbal cueing for anterior weight shift and hand placement. W/c mobility x12' in controlled environment with R hemi technique requiring max A for steering, obstacle negotiation. Gait x7' in controlled environment with R hallway hand rail and +2A for w/c follow. Therapist positioned at pt's L side providing manual facilitation of the following: weight shifting, LLE advancement, manual stabilization of L knee during LLE swing phase. Activation of L hip flexors noted during gait trial. Gait trial ended due to decreased gait stability secondary to  pt distraction, talking. After this PT explained importance of focusing on walking to ensure safety, pt with tearful episode, expressing fear of never walking again.  Throughout session, pt requires frequent verbal cueing to increase awareness of significant lateral trunk lean to R, R cervical spine rotation/lateral flexion. Pt able to self-correct posture, effectively achieve midline orientation >90% of time; however, pt unable to maintain for >10-15 seconds. Frequent redirection also required secondary to pt internal distractibility, perseveration on thirst, diet. Session ended in pt room, where pt was left seated in seated in w/c with quick release belt on and all needs within reach.   Therapy Documentation Precautions:  Precautions Precautions: Fall Precaution Comments: Lt field deficit and Lt inattention; LUE subluxation  Restrictions Weight Bearing Restrictions: No Vital Signs: Therapy Vitals BP: 140/72 mmHg Pain: Pain Assessment Pain Assessment: FLACC Pain Score: 4  Pain Type: Acute pain Pain Location: Shoulder Pain Orientation: Left Pain Descriptors / Indicators: Aching Pain Onset: On-going Pain Intervention(s): Repositioned Multiple Pain Sites: No Locomotion : Ambulation Ambulation/Gait Assistance: 1: +2 Total assist;Other (comment) (w/c follow) Wheelchair Mobility Distance: 12   See FIM for current functional status  Therapy/Group: Individual Therapy  Hobble, Malva Cogan 04/20/2013, 12:51 PM

## 2013-04-20 NOTE — Progress Notes (Signed)
Subjective/Complaints: 63 y.o. 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 She was admitted on 04/08/2013 with dizziness, N/V as well as left-sided numbness and weakness. MRI of the brain showed acute infarct in the left cerebellum. MRA of the head with chronic right ICA artery occlusion and widely patent dominant right vertebral artery. Echocardiogram with ejection fraction of 60% no wall motion abnormalities. Carotid Dopplers showed left CEA patent and right vertebral artery flow is antegrade. Patient did not receive TPA. Neurology services consulted and recommended ASA for thrombotic stroke due to SVD--hold off on trial. maintained on socrates trial,ticagrelor versus aspirin for stroke prevention. Patient with severe HA as well as waxing and waning of left sided weakness with lability. MRI brain repeated 04/13/13 with new restricted diffusion entire R-PCA involving right occipital lobe, mesial and posterior right temporal lobe, mid brain and posterior right thalamus and slight increase in left cerebellar stroke. [name] done negative for LAA thrombus but extensive grade 4 bulky aortic debris and plaque in distal aorta that could be source of embolus--CT chest recommended to rule out penetrating ulcer-negative as below Implantable loop recorder placed by Dr. Lovena Le   Somnolent but arousable  ROS:  Cannot obrtain secondary to mental status Objective: Vital Signs: Blood pressure 133/68, pulse 64, temperature 98.6 F (37 C), temperature source Oral, resp. rate 17, weight 91.173 kg (201 lb), SpO2 100.00%. Ct Angio Chest Aortic Dissect W &/or W/o  04/17/2013   CLINICAL DATA:  Extensive atheromatous plaque in the thoracic aorta. Evaluate for potential penetrating aortic ulcer.  EXAM: CT ANGIOGRAPHY CHEST WITH CONTRAST  TECHNIQUE: Multidetector CT imaging  of the chest was performed using the standard protocol during bolus administration of intravenous contrast. Multiplanar CT image reconstructions and MIPs were obtained to evaluate the vascular anatomy.  CONTRAST:  20m OMNIPAQUE IOHEXOL 350 MG/ML SOLN  COMPARISON:  No priors.  FINDINGS: Mediastinum: Precontrast images demonstrate no crescentic high attenuation associated with the wall of the thoracic aorta to suggest acute intramural hemorrhage at this time. Post-contrast images demonstrate extensive atheromatous plaque throughout the distal aortic arch and descending thoracic aorta. Some of these aortic plaques are ulcerated, best demonstrated on image 81 of series 5 in the distal descending thoracic aorta, but there is no frank penetrating aortic ulcer or signs of dissection at this time. Atheromatous plaque in the great vessels of the mediastinum is also noted, including either high-grade stenosis or complete occlusion of the proximal left subclavian artery. Flow on the distal left subclavian artery is noted, either related to incomplete obstruction, or collateral pathways. Heart size is borderline enlarged. There is no significant pericardial fluid, thickening or pericardial calcification. Calcifications of the mitral annulus. Small hiatal hernia. No pathologically enlarged mediastinal or hilar lymph nodes.  Lungs/Pleura: Linear opacities in the left lower lobe likely reflect chronic scarring. No acute consolidative airspace disease. No pleural effusions. No definite suspicious appearing pulmonary nodules or masses are identified.  Upper Abdomen: Unremarkable.  Musculoskeletal: There are no aggressive appearing lytic or blastic lesions noted in the visualized portions of the skeleton.  Review of the MIP images confirms the above findings.  IMPRESSION: 1. Extensive atheromatous plaque in the thoracic aorta and great vessels of the mediastinum. Although there are several ulcerated atheromatous plaques, there is no  evidence of frank penetrating aortic ulcer or thoracic aortic dissection at this time. 2. There is, however, either high-grade stenosis or  complete occlusion of the proximal left subclavian artery, with distal flow in the left subclavian artery related to either incomplete occlusion or collateralization. 3. Small hiatal hernia.   Electronically Signed   By: Vinnie Langton M.D.   On: 04/17/2013 16:32   Results for orders placed during the hospital encounter of 04/17/13 (from the past 72 hour(s))  GLUCOSE, CAPILLARY     Status: None   Collection Time    04/17/13  8:50 PM      Result Value Ref Range   Glucose-Capillary 89  70 - 99 mg/dL   Comment 1 Notify RN    CBC WITH DIFFERENTIAL     Status: None   Collection Time    04/18/13  6:48 AM      Result Value Ref Range   WBC 8.5  4.0 - 10.5 K/uL   RBC 4.26  3.87 - 5.11 MIL/uL   Hemoglobin 12.0  12.0 - 15.0 g/dL   HCT 37.0  36.0 - 46.0 %   MCV 86.9  78.0 - 100.0 fL   MCH 28.2  26.0 - 34.0 pg   MCHC 32.4  30.0 - 36.0 g/dL   RDW 13.9  11.5 - 15.5 %   Platelets 279  150 - 400 K/uL   Neutrophils Relative % 60  43 - 77 %   Neutro Abs 5.1  1.7 - 7.7 K/uL   Lymphocytes Relative 27  12 - 46 %   Lymphs Abs 2.3  0.7 - 4.0 K/uL   Monocytes Relative 11  3 - 12 %   Monocytes Absolute 0.9  0.1 - 1.0 K/uL   Eosinophils Relative 2  0 - 5 %   Eosinophils Absolute 0.2  0.0 - 0.7 K/uL   Basophils Relative 0  0 - 1 %   Basophils Absolute 0.0  0.0 - 0.1 K/uL  COMPREHENSIVE METABOLIC PANEL     Status: Abnormal   Collection Time    04/18/13  6:48 AM      Result Value Ref Range   Sodium 145  137 - 147 mEq/L   Potassium 3.6 (*) 3.7 - 5.3 mEq/L   Chloride 110  96 - 112 mEq/L   CO2 22  19 - 32 mEq/L   Glucose, Bld 126 (*) 70 - 99 mg/dL   BUN 33 (*) 6 - 23 mg/dL   Creatinine, Ser 1.76 (*) 0.50 - 1.10 mg/dL   Calcium 9.1  8.4 - 10.5 mg/dL   Total Protein 7.0  6.0 - 8.3 g/dL   Albumin 3.2 (*) 3.5 - 5.2 g/dL   AST 12  0 - 37 U/L   ALT 14  0 - 35 U/L    Alkaline Phosphatase 72  39 - 117 U/L   Total Bilirubin 0.3  0.3 - 1.2 mg/dL   GFR calc non Af Amer 30 (*) >90 mL/min   GFR calc Af Amer 35 (*) >90 mL/min   Comment: (NOTE)     The eGFR has been calculated using the CKD EPI equation.     This calculation has not been validated in all clinical situations.     eGFR's persistently <90 mL/min signify possible Chronic Kidney     Disease.  GLUCOSE, CAPILLARY     Status: Abnormal   Collection Time    04/18/13  7:26 AM      Result Value Ref Range   Glucose-Capillary 116 (*) 70 - 99 mg/dL   Comment 1 Notify RN    GLUCOSE, CAPILLARY  Status: Abnormal   Collection Time    04/18/13 11:35 AM      Result Value Ref Range   Glucose-Capillary 123 (*) 70 - 99 mg/dL   Comment 1 Notify RN    URINALYSIS, ROUTINE W REFLEX MICROSCOPIC     Status: Abnormal   Collection Time    04/18/13  1:37 PM      Result Value Ref Range   Color, Urine YELLOW  YELLOW   APPearance CLEAR  CLEAR   Specific Gravity, Urine 1.031 (*) 1.005 - 1.030   pH 5.5  5.0 - 8.0   Glucose, UA NEGATIVE  NEGATIVE mg/dL   Hgb urine dipstick NEGATIVE  NEGATIVE   Bilirubin Urine NEGATIVE  NEGATIVE   Ketones, ur NEGATIVE  NEGATIVE mg/dL   Protein, ur 100 (*) NEGATIVE mg/dL   Urobilinogen, UA 0.2  0.0 - 1.0 mg/dL   Nitrite NEGATIVE  NEGATIVE   Leukocytes, UA NEGATIVE  NEGATIVE  URINE CULTURE     Status: None   Collection Time    04/18/13  1:37 PM      Result Value Ref Range   Specimen Description URINE, CATHETERIZED     Special Requests NONE     Culture  Setup Time       Value: 04/18/2013 20:09     Performed at [facility]       Value: NO GROWTH     Performed at [facility]   Culture       Value: NO GROWTH     Performed at [facility]   Report Status 04/20/2013 FINAL    URINE MICROSCOPIC-ADD ON     Status: Abnormal   Collection Time    04/18/13  1:37 PM      Result Value Ref Range   WBC, UA 0-2  <3 WBC/hpf   Bacteria, UA FEW (*)  RARE   Casts HYALINE CASTS (*) NEGATIVE   Crystals URIC ACID CRYSTALS (*) NEGATIVE  GLUCOSE, CAPILLARY     Status: Abnormal   Collection Time    04/18/13  4:50 PM      Result Value Ref Range   Glucose-Capillary 110 (*) 70 - 99 mg/dL   Comment 1 Notify RN    GLUCOSE, CAPILLARY     Status: Abnormal   Collection Time    04/18/13  8:38 PM      Result Value Ref Range   Glucose-Capillary 130 (*) 70 - 99 mg/dL   Comment 1 Notify RN    GLUCOSE, CAPILLARY     Status: Abnormal   Collection Time    04/19/13  7:47 AM      Result Value Ref Range   Glucose-Capillary 108 (*) 70 - 99 mg/dL  GLUCOSE, CAPILLARY     Status: Abnormal   Collection Time    04/19/13 11:58 AM      Result Value Ref Range   Glucose-Capillary 116 (*) 70 - 99 mg/dL  GLUCOSE, CAPILLARY     Status: Abnormal   Collection Time    04/19/13  4:45 PM      Result Value Ref Range   Glucose-Capillary 104 (*) 70 - 99 mg/dL  GLUCOSE, CAPILLARY     Status: Abnormal   Collection Time    04/19/13  8:45 PM      Result Value Ref Range   Glucose-Capillary 104 (*) 70 - 99 mg/dL  BASIC METABOLIC PANEL     Status: Abnormal   Collection Time  04/20/13  5:05 AM      Result Value Ref Range   Sodium 145  137 - 147 mEq/L   Potassium 3.9  3.7 - 5.3 mEq/L   Chloride 109  96 - 112 mEq/L   CO2 22  19 - 32 mEq/L   Glucose, Bld 127 (*) 70 - 99 mg/dL   BUN 22  6 - 23 mg/dL   Creatinine, Ser 1.46 (*) 0.50 - 1.10 mg/dL   Calcium 8.9  8.4 - 10.5 mg/dL   GFR calc non Af Amer 37 (*) >90 mL/min   GFR calc Af Amer 43 (*) >90 mL/min   Comment: (NOTE)     The eGFR has been calculated using the CKD EPI equation.     This calculation has not been validated in all clinical situations.     eGFR's persistently <90 mL/min signify possible Chronic Kidney     Disease.      General: No acute distress Mood and affect are appropriate Heart: Regular rate and rhythm no rubs murmurs or extra sounds Lungs: Clear to auscultation, breathing unlabored, no  rales or wheezes Abdomen: Positive bowel sounds, soft nontender to palpation, nondistended Extremities: No clubbing, cyanosis, or edema Skin: No evidence of breakdown, no evidence of rash Neurologic: Cranial nerves II through XII intact, motor strength is 5/5 in Right deltoid, bicep, tricep, grip, hip flexor, knee extensors, ankle dorsiflexor and plantar flexor 0/5 on left side Sensory exam normal sensation to light touch and proprioception in right upper and lower extremities, feels deep pressure on left side Cerebellar exam cannot assess on left secondary to weakness Musculoskeletal: Pain with Left wrist ROM Right gaze preference  Assessment/Plan: 1. Functional deficits secondary to Right PCA infarct with Left HP, L HH which require 3+ hours per day of interdisciplinary therapy in a comprehensive inpatient rehab setting. Physiatrist is providing close team supervision and 24 hour management of active medical problems listed below. Physiatrist and rehab team continue to assess barriers to discharge/monitor patient progress toward functional and medical goals. FIM: FIM - Bathing Bathing Steps Patient Completed:  (completed peri/buttocks hygiene only) Bathing: 1: Total-Patient completes 0-2 of 10 parts or less than 25%  FIM - Upper Body Dressing/Undressing Upper body dressing/undressing steps patient completed: Put head through opening of pull over shirt/dress Upper body dressing/undressing: 2: Max-Patient completed 25-49% of tasks FIM - Lower Body Dressing/Undressing Lower body dressing/undressing: 1: Total-Patient completed less than 25% of tasks     FIM - Radio producer Devices: Recruitment consultant Transfers: 1-Two helpers  FIM - Control and instrumentation engineer Devices: Bed rails;Arm rests;HOB elevated Bed/Chair Transfer: 2: Bed > Chair or W/C: Max A (lift and lower assist);2: Sit > Supine: Max A (lifting assist/Pt. 25-49%);3: Supine  > Sit: Mod A (lifting assist/Pt. 50-74%/lift 2 legs  FIM - Locomotion: Wheelchair Distance: 16 Locomotion: Wheelchair: 0: Activity did not occur FIM - Locomotion: Ambulation Locomotion: Ambulation Assistive Devices: Other (comment) (R hallway hand rail) Ambulation/Gait Assistance: 1: +2 Total assist Locomotion: Ambulation: 0: Activity did not occur  Comprehension Comprehension Mode: Auditory Comprehension: 4-Understands basic 75 - 89% of the time/requires cueing 10 - 24% of the time  Expression Expression Mode: Verbal Expression: 5-Expresses basic 90% of the time/requires cueing < 10% of the time.  Social Interaction Social Interaction: 2-Interacts appropriately 25 - 49% of time - Needs frequent redirection.  Problem Solving Problem Solving: 3-Solves basic 50 - 74% of the time/requires cueing 25 - 49% of the time  Memory Memory: 3-Recognizes or recalls 50 - 74% of the time/requires cueing 25 - 49% of the time  Medical Problem List and Plan:  left cerebellar infarct with extension and new R-PCA infarct affecting right occipital lobe, mesial and posterior right temporal lobe, mid brain and posterior right thalamus  -currently on ECASA and plavix for seconary stroke prevention  1. DVT Prophylaxis/Anticoagulation: SCD's, sq heparin. Needs lower ext dopplers upon admit  2. Fibromyalgia/Lleft shoulder pain due to subluxation/ Pain Management: Positioning as patient with LUE neglect. Will add voltaren gel and tylenol prn. Kpad, sportscreme 3. Anxiety disorder/ Mood: Poor awareness with apathy noted. Trial ritalin, titrate dose LCSW to follow for support and evaluation.  4. Neuropsych: This patient is not capable of making decisions on her own behalf.  5. HTN: will monitor with bid checks. Continue catapress, atenolol, norvasc and hydralazine. Elevated systolic, adjust meds 6. Borderline diabetes: Hgb A1c-6.3/MFG-134. CM diet restrictions. SSI for elevated BS---most thickened liquids are  sweetened.  7: Dyslipidemia: Continue zocor  8. CKD stage 3: Will monitor with serial checks.  9. Dysphagia: continue D2,nectar liquids with aspiration precautions. IVF at bedtime to maintain adequate hydration. Patient/family non-compliant--continue to educate on aspiration risk.  10. Constipation: Continue miralax and senna.  11. Hyperactive bladder: check PVRs, UA/UCS negative for infection    LOS (Days) 3  A FACE TO FACE EVALUATION WAS PERFORMED  KIRSTEINS,ANDREW E 04/20/2013, 7:13 AM

## 2013-04-20 NOTE — Progress Notes (Signed)
Occupational Therapy Session Note  Patient Details  Name: Kelli [name] MRN: [id number] Date of Birth: 03/11/1950  Today's Date: 04/20/2013 Time: 1000-1058 and [id number]  Time Calculation (min): 58 min and 28 min   Short Term Goals: Week 1:  OT Short Term Goal 1 (Week 1): Patient will tolerate sitting EOB unsupport for 15 seconds with mod assist OT Short Term Goal 2 (Week 1): Patient will locate self-care item on left side with mod cues OT Short Term Goal 3 (Week 1): Patient will complete bathing task with mod assist OT Short Term Goal 4 (Week 1): Patient will complete toilet transfer to BSC/toilet with max assist consistently  Skilled Therapeutic Interventions/Progress Updates:    Session 1: Pt seen for ADL retraining with focus on attention to L, trunk rotation, and sit<>stand. Pt received sitting in w/c. Completed bathing at sink utilizing mirror as visual cue for upright posture. Pt with max cues for postural control. Pt initiated washing LUE requiring assist for positioning. Placed basin on L side to faciliate trunk rotation when reaching to basin and visual scanning to L. Pt required max cues to locate basin each time. Completed dressing with total assist and max cues for hemi dressing technique. Pt required max assist for sit<>stand and manual facilitation for anterior pevlic tilt and upper trunk elevation with hip extension for upright posture. At end of session pt returned to bed with max assist for squat pivot transfer to L. Max assist for sit>supine. Pt left supine in bed with all needs in reach.   Session 2: Pt seen for 1:1 OT session with focus on bed mobility, attention to L, and NMR to LUE. Pt received supine in bed with husband present. Began session by focusing on increasing comfort with lying on hemiplegic side as pt reports it "makes me nervous." Focused on initiation of neck flexion, thoracic flexion and rotation of trunk with max assist to increase trunk stability in preparation  of sitting balance. Progressed to sidelying>siting EOB with max assist and focus on upper trunk rotation and lateral flexion with max assist for facilitation and stability at trunk and scapula. Engaged in static sitting EOB with max-min assist and supervision approx 2-3 seconds. Therapist providing facilitation for anterior pelvic tilt and upper trunk elevation. Ended session with moving sit>supine with focus on anterior weight shift and lateral flexion. Pt tolerating sidelying on elbow approx 15 sec during this activity and cues provided for neck flexion. Pt required max assist for controlled sit>supine. Positioned pt in bed with pillows and left with all needs in reach. Husband present and no questions or concerns at this time.   Therapy Documentation Precautions:  Precautions Precautions: Fall Precaution Comments: Lt field deficit and Lt inattention; LUE subluxation  Restrictions Weight Bearing Restrictions: No General:   Vital Signs: Session 1: BP 198/78 and HR 70 at rest upon entering room; pt sitting in w/c         BP 189/81 and HR 69 after sit<>stand in bathing session   Notified RN and therapy completed at bedside. Session 2: BP 194/71 and HR 65 while supine in bed.   Pain: No report of pain during therapy sessions   See FIM for current functional status  Therapy/Group: Individual Therapy  Duayne Cal 04/20/2013, 12:38 PM

## 2013-04-20 NOTE — Progress Notes (Signed)
Speech Language Pathology Daily Session Note  Patient Details  Name: Kelli [name] MRN: [id number] Date of Birth: [birth year]  Today's Date: 04/20/2013 Time: 1415-1500 Time Calculation (min): 45 min  Short Term Goals: Week 1: SLP Short Term Goal 1 (Week 1): Pt will utilize safe swallowing strategies with current diet with Min cues SLP Short Term Goal 2 (Week 1): Pt attend to her left visual field during basic functional task with Mod cues SLP Short Term Goal 3 (Week 1): Pt will sustain attention to basic functional task for 5 minutes with Mod cues SLP Short Term Goal 4 (Week 1): Pt will utilize environmental cues to demonstrate orientation x4 with Mod cues SLP Short Term Goal 5 (Week 1): Pt will demonstrate adequate problem solving during basic functional task with Mod cues SLP Short Term Goal 6 (Week 1): Pt will demonstrate effective mastication and oral clearance with Dys 3 textures with Min cues  Skilled Therapeutic Interventions: Skilled therapy session focused on swallow function and cognitive-linguistic function goals. SLP directly supervised patient with nectar thick liquids and magic cup (nectar-thick/pudding consistency)Patient required tactile and verbal cues to reduce impulsivity and to perform throat clear and reswallow. Patient exhibited mild wet/gurgled voice quality after sips, which cleared when patient cleared throat and reswallowed. Cognitive-linguistic goals focused on attention and left-side visual attention. [name] son, husband and a friend were visiting in the room, and SLP provided moderate to maximal verbal and tactile cues for patient to turn head to look left and to then attempt to visually attend to what she was looking at in the left field. Patient also is very internally distracted, tangential, and requires moderate verbal redirection cues to stay on topic and to perform one-step commands. SLP used iPad camera as a mirror to show patient how she was leaning to the right  in the bed. She corrected herself to almost center, but due to her poor attention and decreased physical support, she started to lean back to right. Awareness and attention continue to greatly impact patient's overall function.   FIM:  Comprehension Comprehension Mode: Auditory Comprehension: 3-Understands basic 50 - 74% of the time/requires cueing 25 - 50%  of the time Expression Expression Mode: Verbal Expression: 5-Expresses basic 90% of the time/requires cueing < 10% of the time. Social Interaction Social Interaction: 2-Interacts appropriately 25 - 49% of time - Needs frequent redirection. Problem Solving Problem Solving: 2-Solves basic 25 - 49% of the time - needs direction more than half the time to initiate, plan or complete simple activities Memory Memory: 3-Recognizes or recalls 50 - 74% of the time/requires cueing 25 - 49% of the time FIM - Eating Eating Activity: 5: Needs verbal cues/supervision  Pain Pain Assessment Pain Assessment: No/denies pain Pain Score: 0-No pain Pain Type: Acute pain Pain Location: Shoulder Pain Orientation: Left Pain Descriptors / Indicators: Aching Pain Onset: On-going Pain Intervention(s): Repositioned Multiple Pain Sites: No  Therapy/Group: Individual Therapy Sonia Baller, MA, CCC-SLP [facility] Speech-Language Pathologist   Nadara Mode Tarrell 04/20/2013, 4:42 PM

## 2013-04-21 DIAGNOSIS — I10 Essential (primary) hypertension: Secondary | ICD-10-CM

## 2013-04-21 DIAGNOSIS — N183 Chronic kidney disease, stage 3 unspecified: Secondary | ICD-10-CM

## 2013-04-21 DIAGNOSIS — I6330 Cerebral infarction due to thrombosis of unspecified cerebral artery: Secondary | ICD-10-CM

## 2013-04-21 DIAGNOSIS — I69991 Dysphagia following unspecified cerebrovascular disease: Secondary | ICD-10-CM

## 2013-04-21 DIAGNOSIS — F411 Generalized anxiety disorder: Secondary | ICD-10-CM

## 2013-04-21 LAB — GLUCOSE, CAPILLARY
GLUCOSE-CAPILLARY: 108 mg/dL — AB (ref 70–99)
Glucose-Capillary: 100 mg/dL — ABNORMAL HIGH (ref 70–99)
Glucose-Capillary: 104 mg/dL — ABNORMAL HIGH (ref 70–99)
Glucose-Capillary: 122 mg/dL — ABNORMAL HIGH (ref 70–99)

## 2013-04-21 LAB — URINE CULTURE
Colony Count: NO GROWTH
Culture: NO GROWTH

## 2013-04-21 NOTE — Progress Notes (Signed)
Subjective/Complaints: 63 y.o. 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 She was admitted on 04/08/2013 with dizziness, N/V as well as left-sided numbness and weakness. MRI of the brain showed acute infarct in the left cerebellum. MRA of the head with chronic right ICA artery occlusion and widely patent dominant right vertebral artery. Echocardiogram with ejection fraction of 60% no wall motion abnormalities. Carotid Dopplers showed left CEA patent and right vertebral artery flow is antegrade. Patient did not receive TPA. Neurology services consulted and recommended ASA for thrombotic stroke due to SVD--hold off on trial. maintained on socrates trial,ticagrelor versus aspirin for stroke prevention. Patient with severe HA as well as waxing and waning of left sided weakness with lability. MRI brain repeated 04/13/13 with new restricted diffusion entire R-PCA involving right occipital lobe, mesial and posterior right temporal lobe, mid brain and posterior right thalamus and slight increase in left cerebellar stroke. [name] done negative for LAA thrombus but extensive grade 4 bulky aortic debris and plaque in distal aorta that could be source of embolus--CT chest recommended to rule out penetrating ulcer-negative as below Implantable loop recorder placed by Dr. Lovena Le   Rested well. Slow to arouse again this morning. ROS:  Cannot obrtain secondary to mental status Objective: Vital Signs: Blood pressure 135/83, pulse 56, temperature 97.9 F (36.6 C), temperature source Oral, resp. rate 18, weight 91.173 kg (201 lb), SpO2 94.00%. Ct Angio Chest Aortic Dissect W &/or W/o  04/17/2013   CLINICAL DATA:  Extensive atheromatous plaque in the thoracic aorta. Evaluate for potential penetrating aortic ulcer.  EXAM: CT ANGIOGRAPHY CHEST WITH CONTRAST  TECHNIQUE:  Multidetector CT imaging of the chest was performed using the standard protocol during bolus administration of intravenous contrast. Multiplanar CT image reconstructions and MIPs were obtained to evaluate the vascular anatomy.  CONTRAST:  10m OMNIPAQUE IOHEXOL 350 MG/ML SOLN  COMPARISON:  No priors.  FINDINGS: Mediastinum: Precontrast images demonstrate no crescentic high attenuation associated with the wall of the thoracic aorta to suggest acute intramural hemorrhage at this time. Post-contrast images demonstrate extensive atheromatous plaque throughout the distal aortic arch and descending thoracic aorta. Some of these aortic plaques are ulcerated, best demonstrated on image 81 of series 5 in the distal descending thoracic aorta, but there is no frank penetrating aortic ulcer or signs of dissection at this time. Atheromatous plaque in the great vessels of the mediastinum is also noted, including either high-grade stenosis or complete occlusion of the proximal left subclavian artery. Flow on the distal left subclavian artery is noted, either related to incomplete obstruction, or collateral pathways. Heart size is borderline enlarged. There is no significant pericardial fluid, thickening or pericardial calcification. Calcifications of the mitral annulus. Small hiatal hernia. No pathologically enlarged mediastinal or hilar lymph nodes.  Lungs/Pleura: Linear opacities in the left lower lobe likely reflect chronic scarring. No acute consolidative airspace disease. No pleural effusions. No definite suspicious appearing pulmonary nodules or masses are identified.  Upper Abdomen: Unremarkable.  Musculoskeletal: There are no aggressive appearing lytic or blastic lesions noted in the visualized portions of the skeleton.  Review of the MIP images confirms the above findings.  IMPRESSION: 1. Extensive atheromatous plaque in the thoracic aorta and great vessels of the mediastinum. Although there are several ulcerated  atheromatous plaques, there is no evidence of frank penetrating aortic ulcer or thoracic aortic dissection at this time. 2. There is, however,  either high-grade stenosis or complete occlusion of the proximal left subclavian artery, with distal flow in the left subclavian artery related to either incomplete occlusion or collateralization. 3. Small hiatal hernia.   Electronically Signed   By: Vinnie Langton M.D.   On: 04/17/2013 16:32   Results for orders placed during the hospital encounter of 04/17/13 (from the past 72 hour(s))  GLUCOSE, CAPILLARY     Status: Abnormal   Collection Time    04/18/13 11:35 AM      Result Value Ref Range   Glucose-Capillary 123 (*) 70 - 99 mg/dL   Comment 1 Notify RN    URINALYSIS, ROUTINE W REFLEX MICROSCOPIC     Status: Abnormal   Collection Time    04/18/13  1:37 PM      Result Value Ref Range   Color, Urine YELLOW  YELLOW   APPearance CLEAR  CLEAR   Specific Gravity, Urine 1.031 (*) 1.005 - 1.030   pH 5.5  5.0 - 8.0   Glucose, UA NEGATIVE  NEGATIVE mg/dL   Hgb urine dipstick NEGATIVE  NEGATIVE   Bilirubin Urine NEGATIVE  NEGATIVE   Ketones, ur NEGATIVE  NEGATIVE mg/dL   Protein, ur 100 (*) NEGATIVE mg/dL   Urobilinogen, UA 0.2  0.0 - 1.0 mg/dL   Nitrite NEGATIVE  NEGATIVE   Leukocytes, UA NEGATIVE  NEGATIVE  URINE CULTURE     Status: None   Collection Time    04/18/13  1:37 PM      Result Value Ref Range   Specimen Description URINE, CATHETERIZED     Special Requests NONE     Culture  Setup Time       Value: 04/18/2013 20:09     Performed at [facility]       Value: NO GROWTH     Performed at [facility]   Culture       Value: NO GROWTH     Performed at [facility]   Report Status 04/20/2013 FINAL    URINE MICROSCOPIC-ADD ON     Status: Abnormal   Collection Time    04/18/13  1:37 PM      Result Value Ref Range   WBC, UA 0-2  <3 WBC/hpf   Bacteria, UA FEW (*) RARE   Casts HYALINE CASTS (*)  NEGATIVE   Crystals URIC ACID CRYSTALS (*) NEGATIVE  GLUCOSE, CAPILLARY     Status: Abnormal   Collection Time    04/18/13  4:50 PM      Result Value Ref Range   Glucose-Capillary 110 (*) 70 - 99 mg/dL   Comment 1 Notify RN    GLUCOSE, CAPILLARY     Status: Abnormal   Collection Time    04/18/13  8:38 PM      Result Value Ref Range   Glucose-Capillary 130 (*) 70 - 99 mg/dL   Comment 1 Notify RN    GLUCOSE, CAPILLARY     Status: Abnormal   Collection Time    04/19/13  7:47 AM      Result Value Ref Range   Glucose-Capillary 108 (*) 70 - 99 mg/dL  GLUCOSE, CAPILLARY     Status: Abnormal   Collection Time    04/19/13 11:58 AM      Result Value Ref Range   Glucose-Capillary 116 (*) 70 - 99 mg/dL  GLUCOSE, CAPILLARY     Status: Abnormal   Collection Time    04/19/13  4:45  PM      Result Value Ref Range   Glucose-Capillary 104 (*) 70 - 99 mg/dL  GLUCOSE, CAPILLARY     Status: Abnormal   Collection Time    04/19/13  8:45 PM      Result Value Ref Range   Glucose-Capillary 104 (*) 70 - 99 mg/dL  BASIC METABOLIC PANEL     Status: Abnormal   Collection Time    04/20/13  5:05 AM      Result Value Ref Range   Sodium 145  137 - 147 mEq/L   Potassium 3.9  3.7 - 5.3 mEq/L   Chloride 109  96 - 112 mEq/L   CO2 22  19 - 32 mEq/L   Glucose, Bld 127 (*) 70 - 99 mg/dL   BUN 22  6 - 23 mg/dL   Creatinine, Ser 1.46 (*) 0.50 - 1.10 mg/dL   Calcium 8.9  8.4 - 10.5 mg/dL   GFR calc non Af Amer 37 (*) >90 mL/min   GFR calc Af Amer 43 (*) >90 mL/min   Comment: (NOTE)     The eGFR has been calculated using the CKD EPI equation.     This calculation has not been validated in all clinical situations.     eGFR's persistently <90 mL/min signify possible Chronic Kidney     Disease.  GLUCOSE, CAPILLARY     Status: Abnormal   Collection Time    04/20/13  7:07 AM      Result Value Ref Range   Glucose-Capillary 120 (*) 70 - 99 mg/dL   Comment 1 Notify RN    URINALYSIS, ROUTINE W REFLEX MICROSCOPIC      Status: Abnormal   Collection Time    04/20/13  8:02 AM      Result Value Ref Range   Color, Urine YELLOW  YELLOW   APPearance CLEAR  CLEAR   Specific Gravity, Urine 1.021  1.005 - 1.030   pH 5.5  5.0 - 8.0   Glucose, UA NEGATIVE  NEGATIVE mg/dL   Hgb urine dipstick NEGATIVE  NEGATIVE   Bilirubin Urine NEGATIVE  NEGATIVE   Ketones, ur NEGATIVE  NEGATIVE mg/dL   Protein, ur 100 (*) NEGATIVE mg/dL   Urobilinogen, UA 0.2  0.0 - 1.0 mg/dL   Nitrite NEGATIVE  NEGATIVE   Leukocytes, UA NEGATIVE  NEGATIVE  URINE MICROSCOPIC-ADD ON     Status: Abnormal   Collection Time    04/20/13  8:02 AM      Result Value Ref Range   Squamous Epithelial / LPF RARE  RARE   WBC, UA 0-2  <3 WBC/hpf   RBC / HPF 0-2  <3 RBC/hpf   Bacteria, UA FEW (*) RARE   Crystals URIC ACID CRYSTALS (*) NEGATIVE   Urine-Other AMORPHOUS URATES/PHOSPHATES     Comment: RARE YEAST  GLUCOSE, CAPILLARY     Status: Abnormal   Collection Time    04/20/13 11:46 AM      Result Value Ref Range   Glucose-Capillary 117 (*) 70 - 99 mg/dL   Comment 1 Notify RN    GLUCOSE, CAPILLARY     Status: Abnormal   Collection Time    04/20/13  4:14 PM      Result Value Ref Range   Glucose-Capillary 102 (*) 70 - 99 mg/dL   Comment 1 Notify RN    GLUCOSE, CAPILLARY     Status: Abnormal   Collection Time    04/20/13  8:53 PM  Result Value Ref Range   Glucose-Capillary 116 (*) 70 - 99 mg/dL  GLUCOSE, CAPILLARY     Status: Abnormal   Collection Time    04/21/13  7:41 AM      Result Value Ref Range   Glucose-Capillary 100 (*) 70 - 99 mg/dL   Comment 1 Notify RN        General: No acute distress Mood and affect are appropriate Heart: Regular rate and rhythm no rubs murmurs or extra sounds Lungs: Clear to auscultation, breathing unlabored, no rales or wheezes Abdomen: Positive bowel sounds, soft nontender to palpation, nondistended Extremities: No clubbing, cyanosis, or edema Skin: No evidence of breakdown, no evidence of  rash Neurologic: Cranial nerves II through XII intact, motor strength is 5/5 in Right deltoid, bicep, tricep, grip, hip flexor, knee extensors, ankle dorsiflexor and plantar flexor 0/5 on left side Sensory exam normal sensation to light touch and proprioception in right upper and lower extremities, feels deep pressure on left side Cerebellar exam cannot assess on left secondary to weakness Musculoskeletal: Pain with Left wrist ROM Right gaze preference  Assessment/Plan: 1. Functional deficits secondary to Right PCA infarct with Left HP, L HH which require 3+ hours per day of interdisciplinary therapy in a comprehensive inpatient rehab setting. Physiatrist is providing close team supervision and 24 hour management of active medical problems listed below. Physiatrist and rehab team continue to assess barriers to discharge/monitor patient progress toward functional and medical goals. FIM: FIM - Bathing Bathing Steps Patient Completed: Chest;Left Arm;Right upper leg;Left upper leg Bathing: 2: Max-Patient completes 3-4 [id number] parts or 25-49%  FIM - Upper Body Dressing/Undressing Upper body dressing/undressing steps patient completed: Put head through opening of pull over shirt/dress Upper body dressing/undressing: 1: Total-Patient completed less than 25% of tasks FIM - Lower Body Dressing/Undressing Lower body dressing/undressing: 1: Total-Patient completed less than 25% of tasks     FIM - TRadio producerDevices: BRecruitment consultantTransfers: 1-Two helpers  FIM - BControl and instrumentation engineerDevices: Bed rails;Arm rests;HOB elevated Bed/Chair Transfer: 2: Sit > Supine: Max A (lifting assist/Pt. 25-49%);2: Chair or W/C > Bed: Max A (lift and lower assist)  FIM - Locomotion: Wheelchair Distance: 12 Locomotion: Wheelchair: 1: Travels less than 50 ft with maximal assistance (Pt: 25 - 49%) FIM - Locomotion: Ambulation Locomotion:  Ambulation Assistive Devices: Other (comment) Ambulation/Gait Assistance: 1: +2 Total assist;Other (comment) (w/c follow) Locomotion: Ambulation: 1: Two helpers  Comprehension Comprehension Mode: Auditory Comprehension: 3-Understands basic 50 - 74% of the time/requires cueing 25 - 50%  of the time  Expression Expression Mode: Verbal Expression: 5-Expresses basic 90% of the time/requires cueing < 10% of the time.  Social Interaction Social Interaction: 2-Interacts appropriately 25 - 49% of time - Needs frequent redirection.  Problem Solving Problem Solving: 2-Solves basic 25 - 49% of the time - needs direction more than half the time to initiate, plan or complete simple activities  Memory Memory: 3-Recognizes or recalls 50 - 74% of the time/requires cueing 25 - 49% of the time  Medical Problem List and Plan:  left cerebellar infarct with extension and new R-PCA infarct affecting right occipital lobe, mesial and posterior right temporal lobe, mid brain and posterior right thalamus  -currently on ECASA and plavix for seconary stroke prevention  1. DVT Prophylaxis/Anticoagulation: SCD's, sq heparin. Needs lower ext dopplers upon admit  2. Fibromyalgia/Lleft shoulder pain due to subluxation/ Pain Management: Positioning as patient with LUE neglect. Will add voltaren gel  and tylenol prn. Kpad, sportscreme 3. Anxiety disorder/ Mood: Poor awareness with apathy noted. Trial ritalin, titrate dose LCSW to follow for support and evaluation.  4. Neuropsych: This patient is not capable of making decisions on her own behalf.  5. HTN: will monitor with bid checks. Continue catapress, atenolol, norvasc and hydralazine. Generally improved. Sometimes her anxiety affects 6. Borderline diabetes: Hgb A1c-6.3/MFG-134. CM diet restrictions. SSI for elevated BS---most thickened liquids are sweetened.  7: Dyslipidemia: Continue zocor  8. CKD stage 3: Will monitor with serial checks.  9. Dysphagia: continue  D2,nectar liquids with aspiration precautions. IVF at bedtime to maintain adequate hydration. Patient/family non-compliant--continue to educate on aspiration risk.  10. Constipation: Continue miralax and senna.  11. Hyperactive bladder: check PVRs, UA/UCS negative for infection    LOS (Days) 4  A FACE TO FACE EVALUATION WAS PERFORMED  SWARTZ,ZACHARY T 04/21/2013, 9:16 AM

## 2013-04-22 ENCOUNTER — Inpatient Hospital Stay (HOSPITAL_COMMUNITY): Payer: 59 | Admitting: *Deleted

## 2013-04-22 ENCOUNTER — Inpatient Hospital Stay (HOSPITAL_COMMUNITY): Payer: 59 | Admitting: Physical Therapy

## 2013-04-22 LAB — GLUCOSE, CAPILLARY
Glucose-Capillary: 111 mg/dL — ABNORMAL HIGH (ref 70–99)
Glucose-Capillary: 132 mg/dL — ABNORMAL HIGH (ref 70–99)
Glucose-Capillary: 102 mg/dL — ABNORMAL HIGH (ref 70–99)
Glucose-Capillary: 102 mg/dL — ABNORMAL HIGH (ref 70–99)

## 2013-04-22 MED ORDER — HYDRALAZINE HCL 25 MG PO TABS
25.0000 mg | ORAL_TABLET | Freq: Three times a day (TID) | ORAL | Status: DC
  Administered 2013-04-22 – 2013-04-25 (×9): 25 mg via ORAL
  Filled 2013-04-22 (×12): qty 1

## 2013-04-22 NOTE — Progress Notes (Signed)
Subjective/Complaints: 63 y.o. 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 She was admitted on 04/08/2013 with dizziness, N/V as well as left-sided numbness and weakness. MRI of the brain showed acute infarct in the left cerebellum. MRA of the head with chronic right ICA artery occlusion and widely patent dominant right vertebral artery. Echocardiogram with ejection fraction of 60% no wall motion abnormalities. Carotid Dopplers showed left CEA patent and right vertebral artery flow is antegrade. Patient did not receive TPA. Neurology services consulted and recommended ASA for thrombotic stroke due to SVD--hold off on trial. maintained on socrates trial,ticagrelor versus aspirin for stroke prevention. Patient with severe HA as well as waxing and waning of left sided weakness with lability. MRI brain repeated 04/13/13 with new restricted diffusion entire R-PCA involving right occipital lobe, mesial and posterior right temporal lobe, mid brain and posterior right thalamus and slight increase in left cerebellar stroke. [name] done negative for LAA thrombus but extensive grade 4 bulky aortic debris and plaque in distal aorta that could be source of embolus--CT chest recommended to rule out penetrating ulcer-negative as below Implantable loop recorder placed by Dr. Lovena Le   Up in wheelchair. Just got cleaned up with OT. Notes right neck is tight/knotting. KPAD doesn't work ROS:  Cannot obrtain secondary to mental status Objective: Vital Signs: Blood pressure 137/81, pulse 63, temperature 98.2 F (36.8 C), temperature source Oral, resp. rate 18, weight 91.173 kg (201 lb), SpO2 98.00%. Ct Angio Chest Aortic Dissect W &/or W/o  04/17/2013   CLINICAL DATA:  Extensive atheromatous plaque in the thoracic aorta. Evaluate for potential penetrating aortic ulcer.   EXAM: CT ANGIOGRAPHY CHEST WITH CONTRAST  TECHNIQUE: Multidetector CT imaging of the chest was performed using the standard protocol during bolus administration of intravenous contrast. Multiplanar CT image reconstructions and MIPs were obtained to evaluate the vascular anatomy.  CONTRAST:  65m OMNIPAQUE IOHEXOL 350 MG/ML SOLN  COMPARISON:  No priors.  FINDINGS: Mediastinum: Precontrast images demonstrate no crescentic high attenuation associated with the wall of the thoracic aorta to suggest acute intramural hemorrhage at this time. Post-contrast images demonstrate extensive atheromatous plaque throughout the distal aortic arch and descending thoracic aorta. Some of these aortic plaques are ulcerated, best demonstrated on image 81 of series 5 in the distal descending thoracic aorta, but there is no frank penetrating aortic ulcer or signs of dissection at this time. Atheromatous plaque in the great vessels of the mediastinum is also noted, including either high-grade stenosis or complete occlusion of the proximal left subclavian artery. Flow on the distal left subclavian artery is noted, either related to incomplete obstruction, or collateral pathways. Heart size is borderline enlarged. There is no significant pericardial fluid, thickening or pericardial calcification. Calcifications of the mitral annulus. Small hiatal hernia. No pathologically enlarged mediastinal or hilar lymph nodes.  Lungs/Pleura: Linear opacities in the left lower lobe likely reflect chronic scarring. No acute consolidative airspace disease. No pleural effusions. No definite suspicious appearing pulmonary nodules or masses are identified.  Upper Abdomen: Unremarkable.  Musculoskeletal: There are no aggressive appearing lytic or blastic lesions noted in the visualized portions of the skeleton.  Review of the MIP images confirms the above findings.  IMPRESSION: 1. Extensive atheromatous plaque in the thoracic aorta and great vessels of the  mediastinum. Although there are several ulcerated atheromatous plaques, there is no evidence of frank penetrating aortic ulcer or thoracic  aortic dissection at this time. 2. There is, however, either high-grade stenosis or complete occlusion of the proximal left subclavian artery, with distal flow in the left subclavian artery related to either incomplete occlusion or collateralization. 3. Small hiatal hernia.   Electronically Signed   By: Vinnie Langton M.D.   On: 04/17/2013 16:32   Results for orders placed during the hospital encounter of 04/17/13 (from the past 72 hour(s))  GLUCOSE, CAPILLARY     Status: Abnormal   Collection Time    04/19/13 11:58 AM      Result Value Ref Range   Glucose-Capillary 116 (*) 70 - 99 mg/dL  GLUCOSE, CAPILLARY     Status: Abnormal   Collection Time    04/19/13  4:45 PM      Result Value Ref Range   Glucose-Capillary 104 (*) 70 - 99 mg/dL  GLUCOSE, CAPILLARY     Status: Abnormal   Collection Time    04/19/13  8:45 PM      Result Value Ref Range   Glucose-Capillary 104 (*) 70 - 99 mg/dL  BASIC METABOLIC PANEL     Status: Abnormal   Collection Time    04/20/13  5:05 AM      Result Value Ref Range   Sodium 145  137 - 147 mEq/L   Potassium 3.9  3.7 - 5.3 mEq/L   Chloride 109  96 - 112 mEq/L   CO2 22  19 - 32 mEq/L   Glucose, Bld 127 (*) 70 - 99 mg/dL   BUN 22  6 - 23 mg/dL   Creatinine, Ser 1.46 (*) 0.50 - 1.10 mg/dL   Calcium 8.9  8.4 - 10.5 mg/dL   GFR calc non Af Amer 37 (*) >90 mL/min   GFR calc Af Amer 43 (*) >90 mL/min   Comment: (NOTE)     The eGFR has been calculated using the CKD EPI equation.     This calculation has not been validated in all clinical situations.     eGFR's persistently <90 mL/min signify possible Chronic Kidney     Disease.  GLUCOSE, CAPILLARY     Status: Abnormal   Collection Time    04/20/13  7:07 AM      Result Value Ref Range   Glucose-Capillary 120 (*) 70 - 99 mg/dL   Comment 1 Notify RN    URINALYSIS, ROUTINE  W REFLEX MICROSCOPIC     Status: Abnormal   Collection Time    04/20/13  8:02 AM      Result Value Ref Range   Color, Urine YELLOW  YELLOW   APPearance CLEAR  CLEAR   Specific Gravity, Urine 1.021  1.005 - 1.030   pH 5.5  5.0 - 8.0   Glucose, UA NEGATIVE  NEGATIVE mg/dL   Hgb urine dipstick NEGATIVE  NEGATIVE   Bilirubin Urine NEGATIVE  NEGATIVE   Ketones, ur NEGATIVE  NEGATIVE mg/dL   Protein, ur 100 (*) NEGATIVE mg/dL   Urobilinogen, UA 0.2  0.0 - 1.0 mg/dL   Nitrite NEGATIVE  NEGATIVE   Leukocytes, UA NEGATIVE  NEGATIVE  URINE CULTURE     Status: None   Collection Time    04/20/13  8:02 AM      Result Value Ref Range   Specimen Description URINE, CATHETERIZED     Special Requests Vancomycin     Culture  Setup Time       Value: 04/20/2013 09:38     Performed at [facility]  Colony Count       Value: NO GROWTH     Performed at [facility]   Culture       Value: NO GROWTH     Performed at [facility]   Report Status 04/21/2013 FINAL    URINE MICROSCOPIC-ADD ON     Status: Abnormal   Collection Time    04/20/13  8:02 AM      Result Value Ref Range   Squamous Epithelial / LPF RARE  RARE   WBC, UA 0-2  <3 WBC/hpf   RBC / HPF 0-2  <3 RBC/hpf   Bacteria, UA FEW (*) RARE   Crystals URIC ACID CRYSTALS (*) NEGATIVE   Urine-Other AMORPHOUS URATES/PHOSPHATES     Comment: RARE YEAST  GLUCOSE, CAPILLARY     Status: Abnormal   Collection Time    04/20/13 11:46 AM      Result Value Ref Range   Glucose-Capillary 117 (*) 70 - 99 mg/dL   Comment 1 Notify RN    GLUCOSE, CAPILLARY     Status: Abnormal   Collection Time    04/20/13  4:14 PM      Result Value Ref Range   Glucose-Capillary 102 (*) 70 - 99 mg/dL   Comment 1 Notify RN    GLUCOSE, CAPILLARY     Status: Abnormal   Collection Time    04/20/13  8:53 PM      Result Value Ref Range   Glucose-Capillary 116 (*) 70 - 99 mg/dL  GLUCOSE, CAPILLARY     Status: Abnormal   Collection Time     04/21/13  7:41 AM      Result Value Ref Range   Glucose-Capillary 100 (*) 70 - 99 mg/dL   Comment 1 Notify RN    GLUCOSE, CAPILLARY     Status: Abnormal   Collection Time    04/21/13 11:58 AM      Result Value Ref Range   Glucose-Capillary 108 (*) 70 - 99 mg/dL   Comment 1 Notify RN    GLUCOSE, CAPILLARY     Status: Abnormal   Collection Time    04/21/13  4:39 PM      Result Value Ref Range   Glucose-Capillary 104 (*) 70 - 99 mg/dL   Comment 1 Notify RN    GLUCOSE, CAPILLARY     Status: Abnormal   Collection Time    04/21/13  9:02 PM      Result Value Ref Range   Glucose-Capillary 122 (*) 70 - 99 mg/dL   Comment 1 Notify RN    GLUCOSE, CAPILLARY     Status: Abnormal   Collection Time    04/22/13  7:24 AM      Result Value Ref Range   Glucose-Capillary 111 (*) 70 - 99 mg/dL      General: No acute distress Mood and affect are appropriate Heart: Regular rate and rhythm no rubs murmurs or extra sounds Lungs: Clear to auscultation, breathing unlabored, no rales or wheezes Abdomen: Positive bowel sounds, soft nontender to palpation, nondistended Extremities: No clubbing, cyanosis, or edema Skin: No evidence of breakdown, no evidence of rash Neurologic: Cranial nerves II through XII intact, motor strength is 5/5 in Right deltoid, bicep, tricep, grip, hip flexor, knee extensors, ankle dorsiflexor and plantar flexor 0/5 on left side Sensory exam normal sensation to light touch and proprioception in right upper and lower extremities, feels deep pressure on left side Cerebellar exam cannot assess on left secondary  to weakness Musculoskeletal: Pain with Left wrist ROM. Right SCM slightly tight and painful Right gaze preference. MUCH MORE ALERT TODAY  Assessment/Plan: 1. Functional deficits secondary to Right PCA infarct with Left HP, L HH which require 3+ hours per day of interdisciplinary therapy in a comprehensive inpatient rehab setting. Physiatrist is providing close team  supervision and 24 hour management of active medical problems listed below. Physiatrist and rehab team continue to assess barriers to discharge/monitor patient progress toward functional and medical goals. FIM: FIM - Bathing Bathing Steps Patient Completed: Chest Bathing: 1: Two helpers  FIM - Upper Body Dressing/Undressing Upper body dressing/undressing steps patient completed: Put head through opening of pull over shirt/dress Upper body dressing/undressing: 0: Wears gown/pajamas-no public clothing FIM - Lower Body Dressing/Undressing Lower body dressing/undressing: 1: Total-Patient completed less than 25% of tasks     FIM - Radio producer Devices: Recruitment consultant Transfers: 1-Two helpers  FIM - Control and instrumentation engineer Devices: Bed rails;Arm rests;HOB elevated Bed/Chair Transfer: 2: Sit > Supine: Max A (lifting assist/Pt. 25-49%);2: Chair or W/C > Bed: Max A (lift and lower assist)  FIM - Locomotion: Wheelchair Distance: 12 Locomotion: Wheelchair: 1: Travels less than 50 ft with maximal assistance (Pt: 25 - 49%) FIM - Locomotion: Ambulation Locomotion: Ambulation Assistive Devices: Other (comment) Ambulation/Gait Assistance: 1: +2 Total assist;Other (comment) (w/c follow) Locomotion: Ambulation: 1: Two helpers  Comprehension Comprehension Mode: Auditory Comprehension: 4-Understands basic 75 - 89% of the time/requires cueing 10 - 24% of the time  Expression Expression Mode: Verbal Expression: 5-Expresses basic 90% of the time/requires cueing < 10% of the time.  Social Interaction Social Interaction: 2-Interacts appropriately 25 - 49% of time - Needs frequent redirection.  Problem Solving Problem Solving: 2-Solves basic 25 - 49% of the time - needs direction more than half the time to initiate, plan or complete simple activities  Memory Memory: 3-Recognizes or recalls 50 - 74% of the time/requires cueing 25 - 49%  of the time  Medical Problem List and Plan:  left cerebellar infarct with extension and new R-PCA infarct affecting right occipital lobe, mesial and posterior right temporal lobe, mid brain and posterior right thalamus  -currently on ECASA and plavix for seconary stroke prevention  1. DVT Prophylaxis/Anticoagulation: SCD's, sq heparin. Needs lower ext dopplers upon admit  2. Fibromyalgia/Lleft shoulder pain due to subluxation/ Pain Management: Positioning as patient with LUE neglect. Will add voltaren gel and tylenol prn. Kpad, sportscreme (KPAD not working---equipment to be checked)----encouraged stretching and good posture for neck. 3. Anxiety disorder/ Mood: Poor awareness with apathy noted. Trial ritalin, titrate dose LCSW to follow for support and evaluation.  4. Neuropsych: This patient is not capable of making decisions on her own behalf.  5. HTN: will monitor with bid checks. Continue catapress, atenolol, norvasc and hydralazine. Generally improved. Sometimes her anxiety affects----bp's are good in the am and peak during the mid day typically---may benefit from adjustment to timing of medications---increase hydralazine to TID today 6. Borderline diabetes: Hgb A1c-6.3/MFG-134. CM diet restrictions. SSI for elevated BS---most thickened liquids are sweetened.  7: Dyslipidemia: Continue zocor  8. CKD stage 3:  serial checks.  9. Dysphagia: continue D2,nectar liquids with aspiration precautions. IVF at bedtime to maintain adequate hydration. Patient/family non-compliant--continue to educate on aspiration risk.  10. Constipation: Continue miralax and senna.  11. Hyperactive bladder: check PVRs, UA/UCS negative for infection    LOS (Days) 5  A FACE TO FACE EVALUATION WAS PERFORMED  SWARTZ,ZACHARY T  04/22/2013, 10:56 AM

## 2013-04-22 NOTE — Progress Notes (Signed)
Physical Therapy Session Note  Patient Details  Name: Kelli [name] MRN: [id number] Date of Birth: Sep 02, 1950  Today's Date: 04/22/2013 Time: [id number] Time Calculation (min): 30 min  Short Term Goals: Week 1:  PT Short Term Goal 1 (Week 1): Pt to consistently perform supine<>sit with mod A with HOB flat using bed rail. PT Short Term Goal 2 (Week 1): Pt to consistently perform bed<>chair transfer with mod A. PT Short Term Goal 3 (Week 1): Pt to perform w/c mobility x50' in controlled environment with mod A. PT Short Term Goal 4 (Week 1): Pt to perform sit<>stand with mod A. PT Short Term Goal 5 (Week 1): Pt to perform gait x15' in controlled environment with +1Total A.  Skilled Therapeutic Interventions/Progress Updates:  Pt transferred w/c to edge of bed squat pivot with mod to max A and verbal cues to the R side. Pt tolerated edge of bed about 15 mins with S to min A. Pt stood x3 at edge of bed with mod A. Pt transferred edge of bed to supine with max A on L side. Pt transferred supine to edge of bed on the L side with total A. Pt transferred edge of bed to supine with max A. Pt moved up in bed with bed controls and max A.   Therapy Documentation Precautions:  Precautions Precautions: Fall Precaution Comments: Lt field deficit and Lt inattention; LUE subluxation  Restrictions Weight Bearing Restrictions: No General:   Pain: No c/o pain.   See FIM for current functional status  Therapy/Group: Individual Therapy  Dub Amis 04/22/2013, 3:41 PM

## 2013-04-22 NOTE — Progress Notes (Signed)
Physical Therapy Session Note  Patient Details  Name: ADELAI WOEHLER MRN: [id number] Date of Birth: [birth year]  Today's Date: 04/22/2013 Time: [id number] Time Calculation (min): 58 min  Short Term Goals: Week 1:  PT Short Term Goal 1 (Week 1): Pt to consistently perform supine<>sit with mod A with HOB flat using bed rail. PT Short Term Goal 2 (Week 1): Pt to consistently perform bed<>chair transfer with mod A. PT Short Term Goal 3 (Week 1): Pt to perform w/c mobility x50' in controlled environment with mod A. PT Short Term Goal 4 (Week 1): Pt to perform sit<>stand with mod A. PT Short Term Goal 5 (Week 1): Pt to perform gait x15' in controlled environment with +1Total A.  Skilled Therapeutic Interventions/Progress Updates:  Pt was seen bedside in the am, sitting up in chair. Pt transported to rehab gym. Performed LE exercises, AROM R LE and AAROM to PROM L LE.  Pt stood in parallel bars x 3, pt stood with mod A in parallel bars. While standing focused on standing posture and weight shifting. Pt transferred w/c to edge of mat with max A, squat pivot to R side x 2 with verbal cues. Pt transferred edge of mat to w/c squat pivot max A and edge of mat with sliding board and mod to max A with verbal cues. Pt requiring verbal cues fr technique and verbal cues for encouragement.   Therapy Documentation Precautions:  Precautions Precautions: Fall Precaution Comments: Lt field deficit and Lt inattention; LUE subluxation  Restrictions Weight Bearing Restrictions: No General:   Pain: No c/o pain.   See FIM for current functional status  Therapy/Group: Individual Therapy  Dub Amis 04/22/2013, 3:35 PM

## 2013-04-22 NOTE — Progress Notes (Signed)
Physical Therapy Note  Patient Details  Name: ELAYJAH VIGNEAULT MRN: [id number] Date of Birth: 03-12-1950 Today's Date: 04/22/2013 Time:  [id number]  (77min) Pain:  none Individual session  Engaged in bathing and dressing at sink level.  Pt. Did sit to stand with max assist and OT able to wash peri area.  LLE with decreased control and tended to lean medially during stance.  OT appled manual stabilzation to knee.  Pt washed face, chest stomach, max with UB dsgand total with LB.  Left with NT in room.     2nd session;   Time:  1335-1430  (55 min) Pain:  Left shoulder  5/10 with PROM Individual session Engaged in wc mobility, sit to stand with max assist;l x4 for 20 sec- 30 seconds.  Pt. Donned shoes and socks with max assist while sitting on EOB.  Wheeled wc with max assist.  Pt. Complained of right shoulder pain with PROM.  She stated people pull on it and it hurts.  Educated her about letting people know her shoulder should not be pulled on at all.  Pt. Left in room with call bell and phone in reach.      Lisa Roca 04/22/2013, 8:34 AM

## 2013-04-23 ENCOUNTER — Inpatient Hospital Stay (HOSPITAL_COMMUNITY): Payer: 59

## 2013-04-23 ENCOUNTER — Inpatient Hospital Stay (HOSPITAL_COMMUNITY): Payer: 59 | Admitting: Physical Therapy

## 2013-04-23 ENCOUNTER — Ambulatory Visit: Payer: 59 | Admitting: Cardiology

## 2013-04-23 DIAGNOSIS — I69991 Dysphagia following unspecified cerebrovascular disease: Secondary | ICD-10-CM

## 2013-04-23 DIAGNOSIS — F411 Generalized anxiety disorder: Secondary | ICD-10-CM

## 2013-04-23 DIAGNOSIS — I6330 Cerebral infarction due to thrombosis of unspecified cerebral artery: Secondary | ICD-10-CM

## 2013-04-23 DIAGNOSIS — I10 Essential (primary) hypertension: Secondary | ICD-10-CM

## 2013-04-23 DIAGNOSIS — N183 Chronic kidney disease, stage 3 unspecified: Secondary | ICD-10-CM

## 2013-04-23 LAB — BASIC METABOLIC PANEL
BUN: 24 mg/dL — AB (ref 6–23)
CALCIUM: 8.8 mg/dL (ref 8.4–10.5)
CHLORIDE: 106 meq/L (ref 96–112)
CO2: 22 meq/L (ref 19–32)
CREATININE: 1.35 mg/dL — AB (ref 0.50–1.10)
GFR calc Af Amer: 48 mL/min — ABNORMAL LOW (ref >90)
GFR calc non Af Amer: 41 mL/min — ABNORMAL LOW (ref >90)
Glucose, Bld: 118 mg/dL — ABNORMAL HIGH (ref 70–99)
Potassium: 3.5 mEq/L — ABNORMAL LOW (ref 3.7–5.3)
Sodium: 142 mEq/L (ref 137–147)

## 2013-04-23 LAB — GLUCOSE, CAPILLARY
Glucose-Capillary: 113 mg/dL — ABNORMAL HIGH (ref 70–99)
Glucose-Capillary: 118 mg/dL — ABNORMAL HIGH (ref 70–99)
Glucose-Capillary: 104 mg/dL — ABNORMAL HIGH (ref 70–99)
Glucose-Capillary: 120 mg/dL — ABNORMAL HIGH (ref 70–99)

## 2013-04-23 MED ORDER — POTASSIUM CHLORIDE CRYS ER 10 MEQ PO TBCR
10.0000 meq | EXTENDED_RELEASE_TABLET | Freq: Every day | ORAL | Status: DC
  Administered 2013-04-23 – 2013-05-08 (×16): 10 meq via ORAL
  Filled 2013-04-23 (×17): qty 1

## 2013-04-23 MED ORDER — BENEPROTEIN PO POWD
1.0000 | Freq: Three times a day (TID) | ORAL | Status: DC
  Administered 2013-04-23 – 2013-05-03 (×18): 6 g via ORAL
  Filled 2013-04-23 (×2): qty 227

## 2013-04-23 NOTE — Progress Notes (Signed)
Physical Therapy Session Note  Patient Details  Name: Kelli [name] MRN: [id number] Date of Birth: 1951-02-19  Today's Date: 04/23/2013 Time: 1001-1059 Time Calculation (min): 58 min  Short Term Goals: Week 1:  PT Short Term Goal 1 (Week 1): Pt to consistently perform supine<>sit with mod A with HOB flat using bed rail. PT Short Term Goal 2 (Week 1): Pt to consistently perform bed<>chair transfer with mod A. PT Short Term Goal 3 (Week 1): Pt to perform w/c mobility x50' in controlled environment with mod A. PT Short Term Goal 4 (Week 1): Pt to perform sit<>stand with mod A. PT Short Term Goal 5 (Week 1): Pt to perform gait x15' in controlled environment with +1Total A.  Skilled Therapeutic Interventions/Progress Updates:    2:1.Pt received seated in w/c; agreeable to session. Supine>sit with HOB elevated, bed rail, mod A for LLE management, trunk positioning. Pt reports having soiled brief. Performed sit>stand from w/c with RUE support at sink with mod A, manual stabilization of L knee. Static standing with RUE support at sink x90 seconds with CNA doffed soiled brief, performed peri care, and donned clean brief. Performed w/c mobility x20' in controled environment withR hemi technique and mod A, tactile cueing at RLE to facilitate RLE weightbearing, to increase effectiveness of RLE propulsion. W/c mobility ended secondary to pt-reported nausea. RN notified. Therapist offered to forgo session due to nausea. Pt adamant about participating; however, pt participation limited by nausea.   Therapist transported pt remainder of distance to gym in w/c, where pt performed scoot pivot transfer from w/c<>mat table with mod A, max multimodal cueing for setup, hand placement, sequencing, technique, anterior weight shifting and attention to task. Seated EOM x25 min; see below for detailed description of NMR. Pt continues to require frequent redirection secondary to pt internal distractibility. Transported pt to  room, where therapist assisted pt in performing oral hygiene, per pt request. Pt left seated in seated in w/c with quick release belt on and all needs within reach. CNA notified that oral hygiene had been completed, should pt request ice chips.   Therapy Documentation Precautions:  Precautions Precautions: Fall Precaution Comments: Lt field deficit and Lt inattention; LUE subluxation  Restrictions Weight Bearing Restrictions: No Vital Signs: Therapy Vitals Temp: 97.8 F (36.6 C) Temp src: Oral Pulse Rate: 63 Resp: 18 BP: 141/82 mmHg Patient Position, if appropriate: Lying Oxygen Therapy SpO2: 98 % Pain: Pain Assessment Pain Assessment: No/denies pain NMR:    Seated EOM with bilat LE's supported on floor, bilat UE support at mat table with mirror positioned anterior to pt for visual feedback. Pt performed statc sitting with mod-max cueing to direct pt's attention toward postural alignment (secondary to pt tendency toward R-sided trunk lean, R cervical rotation, R lateral flexion). With cueing, pt able to self-correct posture for <15 seconds prior to returning to abnormal postural alignment. Manual facilitation proivided at L axilla, R ribcage for normalized posture. Manual stabilization, light approximation provided at L shoulder girdle to promote LUE weightbearing throughout. Medial/lateral weight shifting x10 reps per direction. Anterior weight shifting x8 reps prior to transitioning to anterior weight shift followed by scooting L/R. Pt required >10 reps prior to effective carryover of anterior weight shifting with scooting bilaterally, which pt was able to perform with min A at end of session.  See FIM for current functional status  Therapy/Group: Individual Therapy  Decorey Wahlert, Malva Cogan 04/23/2013, 5:06 PM

## 2013-04-23 NOTE — Progress Notes (Signed)
Speech Language Pathology Daily Session Note  Patient Details  Name: Kelli [name] MRN: [id number] Date of Birth: March 15, 1950  Today's Date: 04/23/2013 Time: [id number] Time Calculation (min): 45 min  Short Term Goals: Week 1: SLP Short Term Goal 1 (Week 1): Pt will utilize safe swallowing strategies with current diet with Min cues SLP Short Term Goal 2 (Week 1): Pt attend to her left visual field during basic functional task with Mod cues SLP Short Term Goal 3 (Week 1): Pt will sustain attention to basic functional task for 5 minutes with Mod cues SLP Short Term Goal 4 (Week 1): Pt will utilize environmental cues to demonstrate orientation x4 with Mod cues SLP Short Term Goal 5 (Week 1): Pt will demonstrate adequate problem solving during basic functional task with Mod cues SLP Short Term Goal 6 (Week 1): Pt will demonstrate effective mastication and oral clearance with Dys 3 textures with Min cues  Skilled Therapeutic Interventions: Skilled treatment focused on swallowing and cognitive goals. SLP facilitated session with Max cues for sustained attention to task, with pt very internally distracted, primarily by current diet. Pt required Min A to perform thorough oral care, primarily due to attentional deficits. She consumed 3 ice chips with no overt s/s of aspiration observed (although pt silently aspirated on MBS). Throughout functional conversation regarding current diet textures, pt required Max cues for basic problem solving and Mod-Max cues for recall of events earlier in the day, over the last several days, and information from current conversation. Pt did not recall results of MBS from 2/19; SLP provided education. SLP reviewed menu with pt to find food items that she likes. Upon discussion, pt reported that she likes many of the foods that are Dys 2 textures, however she does not like how they are seasoned here. SLP informed pt that even as diet is advanced, this will not change. SLP left  handout in room with description/examples of Dys 2 textures for family, so that they may bring in options for approval that patient may prefer to eat. Continue plan of care.   FIM:  Comprehension Comprehension Mode: Auditory Comprehension: 4-Understands basic 75 - 89% of the time/requires cueing 10 - 24% of the time Expression Expression Mode: Verbal Expression: 5-Expresses basic 90% of the time/requires cueing < 10% of the time. Social Interaction Social Interaction: 2-Interacts appropriately 25 - 49% of time - Needs frequent redirection. Problem Solving Problem Solving: 2-Solves basic 25 - 49% of the time - needs direction more than half the time to initiate, plan or complete simple activities Memory Memory: 3-Recognizes or recalls 50 - 74% of the time/requires cueing 25 - 49% of the time FIM - Eating Eating Activity: 5: Needs verbal cues/supervision;4: Help with picking up utensils  Pain Pain Assessment Pain Assessment: No/denies pain  Therapy/Group: Individual Therapy   Germain Osgood, M.A. CCC-SLP [phone]  Germain Osgood 04/23/2013, 3:36 PM

## 2013-04-23 NOTE — Progress Notes (Signed)
Subjective/Complaints: 63 y.o. 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 She was admitted on 04/08/2013 with dizziness, N/V as well as left-sided numbness and weakness. MRI of the brain showed acute infarct in the left cerebellum. MRA of the head with chronic right ICA artery occlusion and widely patent dominant right vertebral artery. Echocardiogram with ejection fraction of 60% no wall motion abnormalities. Carotid Dopplers showed left CEA patent and right vertebral artery flow is antegrade. Patient did not receive TPA. Neurology services consulted and recommended ASA for thrombotic stroke due to SVD--hold off on trial. maintained on socrates trial,ticagrelor versus aspirin for stroke prevention. Patient with severe HA as well as waxing and waning of left sided weakness with lability. MRI brain repeated 04/13/13 with new restricted diffusion entire R-PCA involving right occipital lobe, mesial and posterior right temporal lobe, mid brain and posterior right thalamus and slight increase in left cerebellar stroke. [name] done negative for LAA thrombus but extensive grade 4 bulky aortic debris and plaque in distal aorta that could be source of embolus--CT chest recommended to rule out penetrating ulcer-negative as below Implantable loop recorder placed by Dr. Lovena Le   Concerned about diet, wants fruit, maybe protein shake ROS:  Cannot obrtain secondary to mental status Objective: Vital Signs: Blood pressure 177/68, pulse 59, temperature 97.5 F (36.4 C), temperature source Oral, resp. rate 18, weight 91.173 kg (201 lb), SpO2 95.00%. Ct Angio Chest Aortic Dissect W &/or W/o  04/17/2013   CLINICAL DATA:  Extensive atheromatous plaque in the thoracic aorta. Evaluate for potential penetrating aortic ulcer.  EXAM: CT ANGIOGRAPHY CHEST WITH CONTRAST   TECHNIQUE: Multidetector CT imaging of the chest was performed using the standard protocol during bolus administration of intravenous contrast. Multiplanar CT image reconstructions and MIPs were obtained to evaluate the vascular anatomy.  CONTRAST:  3m OMNIPAQUE IOHEXOL 350 MG/ML SOLN  COMPARISON:  No priors.  FINDINGS: Mediastinum: Precontrast images demonstrate no crescentic high attenuation associated with the wall of the thoracic aorta to suggest acute intramural hemorrhage at this time. Post-contrast images demonstrate extensive atheromatous plaque throughout the distal aortic arch and descending thoracic aorta. Some of these aortic plaques are ulcerated, best demonstrated on image 81 of series 5 in the distal descending thoracic aorta, but there is no frank penetrating aortic ulcer or signs of dissection at this time. Atheromatous plaque in the great vessels of the mediastinum is also noted, including either high-grade stenosis or complete occlusion of the proximal left subclavian artery. Flow on the distal left subclavian artery is noted, either related to incomplete obstruction, or collateral pathways. Heart size is borderline enlarged. There is no significant pericardial fluid, thickening or pericardial calcification. Calcifications of the mitral annulus. Small hiatal hernia. No pathologically enlarged mediastinal or hilar lymph nodes.  Lungs/Pleura: Linear opacities in the left lower lobe likely reflect chronic scarring. No acute consolidative airspace disease. No pleural effusions. No definite suspicious appearing pulmonary nodules or masses are identified.  Upper Abdomen: Unremarkable.  Musculoskeletal: There are no aggressive appearing lytic or blastic lesions noted in the visualized portions of the skeleton.  Review of the MIP images confirms the above findings.  IMPRESSION: 1. Extensive atheromatous plaque in the thoracic aorta and great vessels of the mediastinum. Although there are several  ulcerated atheromatous plaques, there is no evidence of frank penetrating aortic ulcer or thoracic aortic dissection at this time. 2. There is, however,  either high-grade stenosis or complete occlusion of the proximal left subclavian artery, with distal flow in the left subclavian artery related to either incomplete occlusion or collateralization. 3. Small hiatal hernia.   Electronically Signed   By: Vinnie Langton M.D.   On: 04/17/2013 16:32   Results for orders placed during the hospital encounter of 04/17/13 (from the past 72 hour(s))  URINALYSIS, ROUTINE W REFLEX MICROSCOPIC     Status: Abnormal   Collection Time    04/20/13  8:02 AM      Result Value Ref Range   Color, Urine YELLOW  YELLOW   APPearance CLEAR  CLEAR   Specific Gravity, Urine 1.021  1.005 - 1.030   pH 5.5  5.0 - 8.0   Glucose, UA NEGATIVE  NEGATIVE mg/dL   Hgb urine dipstick NEGATIVE  NEGATIVE   Bilirubin Urine NEGATIVE  NEGATIVE   Ketones, ur NEGATIVE  NEGATIVE mg/dL   Protein, ur 100 (*) NEGATIVE mg/dL   Urobilinogen, UA 0.2  0.0 - 1.0 mg/dL   Nitrite NEGATIVE  NEGATIVE   Leukocytes, UA NEGATIVE  NEGATIVE  URINE CULTURE     Status: None   Collection Time    04/20/13  8:02 AM      Result Value Ref Range   Specimen Description URINE, CATHETERIZED     Special Requests Vancomycin     Culture  Setup Time       Value: 04/20/2013 09:38     Performed at [facility]       Value: NO GROWTH     Performed at [facility]   Culture       Value: NO GROWTH     Performed at [facility]   Report Status 04/21/2013 FINAL    URINE MICROSCOPIC-ADD ON     Status: Abnormal   Collection Time    04/20/13  8:02 AM      Result Value Ref Range   Squamous Epithelial / LPF RARE  RARE   WBC, UA 0-2  <3 WBC/hpf   RBC / HPF 0-2  <3 RBC/hpf   Bacteria, UA FEW (*) RARE   Crystals URIC ACID CRYSTALS (*) NEGATIVE   Urine-Other AMORPHOUS URATES/PHOSPHATES     Comment: RARE YEAST  GLUCOSE,  CAPILLARY     Status: Abnormal   Collection Time    04/20/13 11:46 AM      Result Value Ref Range   Glucose-Capillary 117 (*) 70 - 99 mg/dL   Comment 1 Notify RN    GLUCOSE, CAPILLARY     Status: Abnormal   Collection Time    04/20/13  4:14 PM      Result Value Ref Range   Glucose-Capillary 102 (*) 70 - 99 mg/dL   Comment 1 Notify RN    GLUCOSE, CAPILLARY     Status: Abnormal   Collection Time    04/20/13  8:53 PM      Result Value Ref Range   Glucose-Capillary 116 (*) 70 - 99 mg/dL  GLUCOSE, CAPILLARY     Status: Abnormal   Collection Time    04/21/13  7:41 AM      Result Value Ref Range   Glucose-Capillary 100 (*) 70 - 99 mg/dL   Comment 1 Notify RN    GLUCOSE, CAPILLARY     Status: Abnormal   Collection Time    04/21/13 11:58 AM      Result Value Ref Range   Glucose-Capillary 108 (*)  70 - 99 mg/dL   Comment 1 Notify RN    GLUCOSE, CAPILLARY     Status: Abnormal   Collection Time    04/21/13  4:39 PM      Result Value Ref Range   Glucose-Capillary 104 (*) 70 - 99 mg/dL   Comment 1 Notify RN    GLUCOSE, CAPILLARY     Status: Abnormal   Collection Time    04/21/13  9:02 PM      Result Value Ref Range   Glucose-Capillary 122 (*) 70 - 99 mg/dL   Comment 1 Notify RN    GLUCOSE, CAPILLARY     Status: Abnormal   Collection Time    04/22/13  7:24 AM      Result Value Ref Range   Glucose-Capillary 111 (*) 70 - 99 mg/dL  GLUCOSE, CAPILLARY     Status: Abnormal   Collection Time    04/22/13 11:29 AM      Result Value Ref Range   Glucose-Capillary 132 (*) 70 - 99 mg/dL  GLUCOSE, CAPILLARY     Status: Abnormal   Collection Time    04/22/13  4:33 PM      Result Value Ref Range   Glucose-Capillary 102 (*) 70 - 99 mg/dL  GLUCOSE, CAPILLARY     Status: Abnormal   Collection Time    04/22/13  9:21 PM      Result Value Ref Range   Glucose-Capillary 102 (*) 70 - 99 mg/dL  BASIC METABOLIC PANEL     Status: Abnormal   Collection Time    04/23/13  5:55 AM      Result Value  Ref Range   Sodium 142  137 - 147 mEq/L   Potassium 3.5 (*) 3.7 - 5.3 mEq/L   Chloride 106  96 - 112 mEq/L   CO2 22  19 - 32 mEq/L   Glucose, Bld 118 (*) 70 - 99 mg/dL   BUN 24 (*) 6 - 23 mg/dL   Creatinine, Ser 1.35 (*) 0.50 - 1.10 mg/dL   Calcium 8.8  8.4 - 10.5 mg/dL   GFR calc non Af Amer 41 (*) >90 mL/min   GFR calc Af Amer 48 (*) >90 mL/min   Comment: (NOTE)     The eGFR has been calculated using the CKD EPI equation.     This calculation has not been validated in all clinical situations.     eGFR's persistently <90 mL/min signify possible Chronic Kidney     Disease.      General: No acute distress Mood and affect are appropriate Heart: Regular rate and rhythm no rubs murmurs or extra sounds Lungs: Clear to auscultation, breathing unlabored, no rales or wheezes Abdomen: Positive bowel sounds, soft nontender to palpation, nondistended Extremities: No clubbing, cyanosis, or edema Skin: No evidence of breakdown, no evidence of rash Neurologic: Cranial nerves II through XII intact, motor strength is 5/5 in Right deltoid, bicep, tricep, grip, hip flexor, knee extensors, ankle dorsiflexor and plantar flexor 0/5 on left side Sensory exam normal sensation to light touch and proprioception in right upper and lower extremities, feels deep pressure on left side Cerebellar exam cannot assess on left secondary to weakness Musculoskeletal: Pain with Left wrist ROM. Right SCM slightly tight and painful Right gaze preference.   Assessment/Plan: 1. Functional deficits secondary to Right PCA infarct with Left HP, L HH which require 3+ hours per day of interdisciplinary therapy in a comprehensive inpatient rehab setting. Physiatrist is providing close team  supervision and 24 hour management of active medical problems listed below. Physiatrist and rehab team continue to assess barriers to discharge/monitor patient progress toward functional and medical goals. FIM: FIM - Bathing Bathing Steps  Patient Completed: Chest;Abdomen Bathing: 1: Total-Patient completes 0-2 of 10 parts or less than 25%  FIM - Upper Body Dressing/Undressing Upper body dressing/undressing steps patient completed: Put head through opening of pull over shirt/dress Upper body dressing/undressing: 1: Total-Patient completed less than 25% of tasks FIM - Lower Body Dressing/Undressing Lower body dressing/undressing: 1: Total-Patient completed less than 25% of tasks     FIM - Radio producer Devices: Recruitment consultant Transfers: 0-Activity did not occur  FIM - Control and instrumentation engineer Devices: Arm rests;Bed rails Bed/Chair Transfer: 1: Supine > Sit: Total A (helper does all/Pt. < 25%);2: Sit > Supine: Max A (lifting assist/Pt. 25-49%);2: Chair or W/C > Bed: Max A (lift and lower assist);2: Bed > Chair or W/C: Max A (lift and lower assist)  FIM - Locomotion: Wheelchair Distance: 12 Locomotion: Wheelchair: 1: Travels less than 50 ft with maximal assistance (Pt: 25 - 49%) FIM - Locomotion: Ambulation Locomotion: Ambulation Assistive Devices: Other (comment) Ambulation/Gait Assistance: 1: +2 Total assist;Other (comment) (w/c follow) Locomotion: Ambulation: 1: Two helpers  Comprehension Comprehension Mode: Auditory Comprehension: 4-Understands basic 75 - 89% of the time/requires cueing 10 - 24% of the time  Expression Expression Mode: Verbal Expression: 5-Expresses basic 90% of the time/requires cueing < 10% of the time.  Social Interaction Social Interaction: 3-Interacts appropriately 50 - 74% of the time - May be physically or verbally inappropriate.  Problem Solving Problem Solving: 3-Solves basic 50 - 74% of the time/requires cueing 25 - 49% of the time  Memory Memory: 3-Recognizes or recalls 50 - 74% of the time/requires cueing 25 - 49% of the time  Medical Problem List and Plan:  left cerebellar infarct with extension and new R-PCA infarct  affecting right occipital lobe, mesial and posterior right temporal lobe, mid brain and posterior right thalamus  -currently on ECASA and plavix for seconary stroke prevention  1. DVT Prophylaxis/Anticoagulation: SCD's, sq heparin.  2. Fibromyalgia/Lleft shoulder pain due to subluxation/ Pain Management:nio pain c/os today3. Anxiety disorder/ Mood: Poor awareness with apathy noted. Trial ritalin, titrate dose LCSW to follow for support and evaluation.  4. Neuropsych: This patient is not capable of making decisions on her own behalf.  5. HTN: will monitor with bid checks. Continue catapress, atenolol, norvasc and hydralazine. Generally improved. Sometimes her anxiety affects----bp's are good in the am and peak during the mid day typically---may benefit from adjustment to timing of medications---increase hydralazine to TID today 6. Borderline diabetes: Hgb A1c-6.3/MFG-134. CM diet restrictions. SSI for elevated BS---most thickened liquids are sweetened.  7: Dyslipidemia: Continue zocor  8. CKD stage 3:  serial checks.  9. Dysphagia: continue D2,nectar liquids with aspiration precautions. IVF at bedtime to maintain adequate hydration. Patient/family non-compliant--continue to educate on aspiration risk.  10. Constipation: Continue miralax and senna.  11. Hyperactive bladder: check PVRs, UA/UCS negative for infection    LOS (Days) 6  A FACE TO FACE EVALUATION WAS PERFORMED  KIRSTEINS,ANDREW E 04/23/2013, 7:17 AM

## 2013-04-23 NOTE — Progress Notes (Signed)
Occupational Therapy Session Note  Patient Details  Name: Kelli [name] MRN: [id number] Date of Birth: [birth year]  Today's Date: 04/23/2013 Time: [id number] and [id number] Time Calculation (min): 60 min and 27 min  Short Term Goals: Week 1:  OT Short Term Goal 1 (Week 1): Patient will tolerate sitting EOB unsupport for 15 seconds with mod assist OT Short Term Goal 2 (Week 1): Patient will locate self-care item on left side with mod cues OT Short Term Goal 3 (Week 1): Patient will complete bathing task with mod assist OT Short Term Goal 4 (Week 1): Patient will complete toilet transfer to BSC/toilet with max assist consistently  Skilled Therapeutic Interventions/Progress Updates:    Session 1: Pt seen for ADL retraining with focus on awareness, sitting balance, sustained attention, and visual scanning to L. Pt received supine in bed speaking about current diet and how she was unsatisfied. Provided discussion as to safe swallowing precautions and reasons she is on a modified diet however pt reporting understanding but not wanting to follow this and declining all food options in her diet. With max cues for redirection to task pt engaged in bathing and dressing sitting EOB with focus on postural control in sitting. Pt required min assist for sitting balance throughout task and manual facilitation at trunk. Presented bathing items from left side to increase attention. Pt required max cues to locate basin located slight to left visual field. Pt with limited focused attention to task requiring max cues throughout session and assist for initiation. Focused on anterior weight shift in preparation for sit<>stand during LB bathing and dressing. At end of session pt completed squat pivot transfer to right bed>w/c with assist for initiation of anterior weight shift. Pt crying 4x during therapy session secondary to speaking about CLOF and speaking about her father. At end of session pt left with all needs in reach and  arm trough placed for LUE.   Session 2: Pt seen for 1:1 OT session with focus on toilet transfers, overall awareness, and attention to task. Pt received supine in bed. Completed supine>sit with max assist and emphasis on flexion at neck and rotation at trunk. Engaged in sitting EOB with min assist and max cues at pt distracted by yellow socks she was wearing. Completed toilet transfer w/c<>toilet with max assist using grab bars and assist to initiate anterior weight shift. Pt required min-mod assist for sitting balance on toilet and stating she was leaning however no balance reactions noted. Provided cue of weight shift to correct posture and manual facilitation. Pt required max cues to focus on sitting balance during toilet task as she was easily distracted by internal and external factors. Completed squat pivot transfer w/c>bed and engaged in scooting back in bed with min assist and emphasis on anterior weight shift. At end of session pt returned to bed and left with all needs in reach.   Therapy Documentation Precautions:  Precautions Precautions: Fall Precaution Comments: Lt field deficit and Lt inattention; LUE subluxation  Restrictions Weight Bearing Restrictions: No General:   Vital Signs: Therapy Vitals Temp: 97.5 F (36.4 C) Temp src: Oral Pulse Rate: 67 Resp: 18 BP: 172/70 mmHg Patient Position, if appropriate: Lying Oxygen Therapy SpO2: 95 % O2 Device: None (Room air) Pain: No report of pain during therapy sessions.   See FIM for current functional status  Therapy/Group: Individual Therapy  Duayne Cal 04/23/2013, 9:49 AM

## 2013-04-24 ENCOUNTER — Inpatient Hospital Stay (HOSPITAL_COMMUNITY): Payer: 59 | Admitting: Occupational Therapy

## 2013-04-24 ENCOUNTER — Inpatient Hospital Stay (HOSPITAL_COMMUNITY): Payer: 59

## 2013-04-24 ENCOUNTER — Inpatient Hospital Stay (HOSPITAL_COMMUNITY): Payer: 59 | Admitting: Physical Therapy

## 2013-04-24 LAB — GLUCOSE, CAPILLARY
GLUCOSE-CAPILLARY: 99 mg/dL (ref 70–99)
Glucose-Capillary: 111 mg/dL — ABNORMAL HIGH (ref 70–99)
Glucose-Capillary: 112 mg/dL — ABNORMAL HIGH (ref 70–99)
Glucose-Capillary: 99 mg/dL (ref 70–99)

## 2013-04-24 NOTE — Progress Notes (Signed)
Physical Therapy Session Note  Patient Details  Name: Kelli [name] MRN: [id number] Date of Birth: 13-Feb-1951  Today's Date: 04/24/2013 Time: 1015-1100 (Co-tx with CC from 11-1028; entire session from 1000-1100) and [id number] Time Calculation (min): 45 min and 38 min  Short Term Goals: Week 1:  PT Short Term Goal 1 (Week 1): Pt to consistently perform supine<>sit with mod A with HOB flat using bed rail. PT Short Term Goal 2 (Week 1): Pt to consistently perform bed<>chair transfer with mod A. PT Short Term Goal 3 (Week 1): Pt to perform w/c mobility x50' in controlled environment with mod A. PT Short Term Goal 4 (Week 1): Pt to perform sit<>stand with mod A. PT Short Term Goal 5 (Week 1): Pt to perform gait x15' in controlled environment with +1Total A.  Skilled Therapeutic Interventions/Progress Updates:    Treatment Session 1: Skilled physical therapy co-tx (with CC, PT) for initial 30 minutes of 60-minute session. See CC's treatment note for detailed description of w/c mobility interventions. Gait x15' in controlled environment with +2A and RUE support at mat table. One PT positioned anterior to pt providing manual stabilization of L knee (to prevent buckling) during <5% of trial, tactile cueing for LLE stance stability, initiationLLE advancement via movement pillowcase under L foot, and tactile cueing at L hip flexors. Other PT positioned posterior to pt providing mod A for stability/balance, tactile cueing at L gluteus maximus. L gluteus maximus activation noted. Pt able to initiate LLE advancement during ~50% of gait trial with pillowcase under L foot to decrease friction.   Seated in w/c, pt performed Kinetron for LLE NMR to facilitate bilat reciprocal LE flexion/extension necessary for gait. Pt performed Kinetron with bilat LE's x20 reps per side, then with LLE only x10 reps with tactile cueing at L ankle dorsiflexors. No active L ankle dorsiflexion noted. Squat pivot transfer from w/c<>bed  with mod A. Therapist departed with pt seated in w/c with quick release belt on for safety and all needs within reach.  Treatment Session 2: Pt received seated in w/c with quick release belt on. Pt agreeable to therapy. Session focused on sustained attention, transfer training (focus on setup and technique), and static standing balance. Transported pt to gym, where pt verbalized and demonstrated how to safely setup for squat pivot transfer with increased time, mod multimodal cueing for w/c parts management, and max assist with management of L brake, L leg rest. Pt able to accurately verbalize ~75% of setup and demonstrated ability to perform ~25% of setup. Pt required min cueing for redirection during task. After set up was completed, pt performed w/c<>mat table with mod A, manual facilitation of anterior weight shift. During transfer, soiled brief noted.  Transported pt to room, where pt performed sit<>stand from w/c with mod A, RUE support at sink. Static standing with RUE support at sink x90 seconds with mod A while CNA performed peri care and changed brief. During static standing trial, therapist provided tactile facilitation at R ribs, L axilla for upright posture. Mod verbal cueing required for pt to utilize mirror for visual feedback to promote midline posture. Therapist departed with pt seated in w/c with quick release belt on for safety and all needs within reach.  Overall, pt demonstrated improved sustained attention and increased L knee control during AM and PM sessions.  Therapy Documentation Precautions:  Precautions Precautions: Fall Precaution Comments: Lt field deficit and Lt inattention; LUE subluxation  Restrictions Weight Bearing Restrictions: No Pain: Pain Assessment Pain Assessment: No/denies  pain Pain Score: 0-No pain Locomotion : Ambulation Ambulation/Gait Assistance: 1: +2 Total assist;Other (comment) Wheelchair Mobility Distance: 100   See FIM for current functional  status  Therapy/Group: Individual Therapy and Co-Treatment  Kinte Trim, Malva Cogan 04/24/2013, 12:54 PM

## 2013-04-24 NOTE — Progress Notes (Signed)
Occupational Therapy Session Note  Patient Details  Name: Kelli [name] MRN: [id number] Date of Birth: 11/02/1950  Today's Date: 04/24/2013 Time: 0800-0900 Time Calculation (min): 60 min  Short Term Goals: Week 1:  OT Short Term Goal 1 (Week 1): Patient will tolerate sitting EOB unsupport for 15 seconds with mod assist OT Short Term Goal 2 (Week 1): Patient will locate self-care item on left side with mod cues OT Short Term Goal 3 (Week 1): Patient will complete bathing task with mod assist OT Short Term Goal 4 (Week 1): Patient will complete toilet transfer to BSC/toilet with max assist consistently  Skilled Therapeutic Interventions/Progress Updates:  Patient sleeping in bed upon arrival and reports very tired and had difficulty sleeping secondary to "too noisy".  Engaged in self care retraining to include sponge bath at sink and dressing.  Focused session on attention to left, sitting and standing balance, activity tolerance, and management of LUE & LLE during all functional mobility and BADL tasks.  Patient able to focus on tasks with only minimal redirection cues.  Patient stood at sink 5-6 times to accomplish removal of brief, bathe peri area and buttocks, don clean brief and pull up pants.  Several times patient begins to sit before task is complete and 2 times she sat before this OT was ready and she required max assist to sit back down safely. Patient resting in w/c with QRB in place and RN in the room.  RN notified that patient wants to brush her teeth.  Therapy Documentation Precautions:  Precautions Precautions: Fall Precaution Comments: Lt field deficit and Lt inattention; LUE subluxation  Restrictions Weight Bearing Restrictions: No General:   Vital Signs:   Pain:   ADL:   Exercises:   Other Treatments:    See FIM for current functional status  Therapy/Group: Individual Therapy  Naveyah Iacovelli, [place] 04/24/2013, 11:06 AM

## 2013-04-24 NOTE — Progress Notes (Signed)
Speech Language Pathology Daily Session Note  Patient Details  Name: Kelli [name] MRN: [id number] Date of Birth: April 27, 1950  Today's Date: 04/24/2013 Time: 1120-1200 Time Calculation (min): 40 min  Short Term Goals: Week 1: SLP Short Term Goal 1 (Week 1): Pt will utilize safe swallowing strategies with current diet with Min cues SLP Short Term Goal 2 (Week 1): Pt attend to her left visual field during basic functional task with Mod cues SLP Short Term Goal 3 (Week 1): Pt will sustain attention to basic functional task for 5 minutes with Mod cues SLP Short Term Goal 4 (Week 1): Pt will utilize environmental cues to demonstrate orientation x4 with Mod cues SLP Short Term Goal 5 (Week 1): Pt will demonstrate adequate problem solving during basic functional task with Mod cues SLP Short Term Goal 6 (Week 1): Pt will demonstrate effective mastication and oral clearance with Dys 3 textures with Min cues  Skilled Therapeutic Interventions: Skilled treatment focused on cognitive and swallowing goals. SLP facilitated session with Mod-Max cues for redirection to structured tasks in quiet environment. After performing oral care, pt consumed ~10 ice chips with intermittent s/s of aspiration, including wet voice, throat clear, and cough. Pt performed oral motor exercises with Max cues for accuracy and to attend to midline, where a mirror was positioned for self-monitoring. Pt required Max cues for basic verbal problem solving related to left-sided pocketing of food. Continue plan of care.   FIM:  Comprehension Comprehension Mode: Auditory Comprehension: 4-Understands basic 75 - 89% of the time/requires cueing 10 - 24% of the time Expression Expression Mode: Verbal Expression: 5-Expresses complex 90% of the time/cues < 10% of the time Social Interaction Social Interaction: 3-Interacts appropriately 50 - 74% of the time - May be physically or verbally inappropriate. Problem Solving Problem Solving:  2-Solves basic 25 - 49% of the time - needs direction more than half the time to initiate, plan or complete simple activities Memory Memory: 3-Recognizes or recalls 50 - 74% of the time/requires cueing 25 - 49% of the time FIM - Eating Eating Activity: 5: Needs verbal cues/supervision;4: Help with picking up utensils  Pain Pain Assessment Pain Assessment: No/denies pain  Therapy/Group: Individual Therapy   Germain Osgood, M.A. CCC-SLP [phone]  Germain Osgood 04/24/2013, 2:02 PM

## 2013-04-24 NOTE — Progress Notes (Signed)
Physical Therapy Note  Patient Details  Name: Kelli [name] MRN: [id number] Date of Birth: February 18, 1951 Today's Date: 04/24/2013 1000-1015; 15 min co-treat for skilled +2 No pain reported  15 min of co-treat focused on neuromuscular re-education via visual cues, auditory cues, tactile cues for L attention during w/c propulsion x 100' with mod assist for steering due to encountering obstacles on L.  Pt benefited from a pillow behind her back in w/c to decrease posterior pelvic tilt, bring R foot closer to floor.  Ronak Duquette 04/24/2013, 12:41 PM

## 2013-04-24 NOTE — Progress Notes (Signed)
Subjective/Complaints: 63 y.o. 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 She was admitted on 04/08/2013 with dizziness, N/V as well as left-sided numbness and weakness. MRI of the brain showed acute infarct in the left cerebellum. MRA of the head with chronic right ICA artery occlusion and widely patent dominant right vertebral artery. Echocardiogram with ejection fraction of 60% no wall motion abnormalities. Carotid Dopplers showed left CEA patent and right vertebral artery flow is antegrade. Patient did not receive TPA. Neurology services consulted and recommended ASA for thrombotic stroke due to SVD--hold off on trial. maintained on socrates trial,ticagrelor versus aspirin for stroke prevention. Patient with severe HA as well as waxing and waning of left sided weakness with lability. MRI brain repeated 04/13/13 with new restricted diffusion entire R-PCA involving right occipital lobe, mesial and posterior right temporal lobe, mid brain and posterior right thalamus and slight increase in left cerebellar stroke. [name] done negative for LAA thrombus but extensive grade 4 bulky aortic debris and plaque in distal aorta that could be source of embolus--CT chest recommended to rule out penetrating ulcer-negative as below Implantable loop recorder placed by Dr. Lovena Le   OT reports some Left shoulder Horz adduction ROS:  Cannot obrtain secondary to mental status Objective: Vital Signs: Blood pressure 184/72, pulse 61, temperature 97.7 F (36.5 C), temperature source Oral, resp. rate 17, weight 91.173 kg (201 lb), SpO2 94.00%. Ct Angio Chest Aortic Dissect W &/or W/o  04/17/2013   CLINICAL DATA:  Extensive atheromatous plaque in the thoracic aorta. Evaluate for potential penetrating aortic ulcer.  EXAM: CT ANGIOGRAPHY CHEST WITH CONTRAST  TECHNIQUE:  Multidetector CT imaging of the chest was performed using the standard protocol during bolus administration of intravenous contrast. Multiplanar CT image reconstructions and MIPs were obtained to evaluate the vascular anatomy.  CONTRAST:  41mL OMNIPAQUE IOHEXOL 350 MG/ML SOLN  COMPARISON:  No priors.  FINDINGS: Mediastinum: Precontrast images demonstrate no crescentic high attenuation associated with the wall of the thoracic aorta to suggest acute intramural hemorrhage at this time. Post-contrast images demonstrate extensive atheromatous plaque throughout the distal aortic arch and descending thoracic aorta. Some of these aortic plaques are ulcerated, best demonstrated on image 81 of series 5 in the distal descending thoracic aorta, but there is no frank penetrating aortic ulcer or signs of dissection at this time. Atheromatous plaque in the great vessels of the mediastinum is also noted, including either high-grade stenosis or complete occlusion of the proximal left subclavian artery. Flow on the distal left subclavian artery is noted, either related to incomplete obstruction, or collateral pathways. Heart size is borderline enlarged. There is no significant pericardial fluid, thickening or pericardial calcification. Calcifications of the mitral annulus. Small hiatal hernia. No pathologically enlarged mediastinal or hilar lymph nodes.  Lungs/Pleura: Linear opacities in the left lower lobe likely reflect chronic scarring. No acute consolidative airspace disease. No pleural effusions. No definite suspicious appearing pulmonary nodules or masses are identified.  Upper Abdomen: Unremarkable.  Musculoskeletal: There are no aggressive appearing lytic or blastic lesions noted in the visualized portions of the skeleton.  Review of the MIP images confirms the above findings.  IMPRESSION: 1. Extensive atheromatous plaque in the thoracic aorta and great vessels of the mediastinum. Although there are several ulcerated  atheromatous plaques, there is no evidence of frank penetrating aortic ulcer or thoracic aortic dissection at this time. 2. 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 3. Small hiatal hernia.   Electronically Signed   By: Vinnie Langton M.D.   On: 04/17/2013 16:32   Results for orders placed during the hospital encounter of 04/17/13 (from the past 72 hour(s))  GLUCOSE, CAPILLARY     Status: Abnormal   Collection Time    04/21/13 11:58 AM      Result Value Ref Range   Glucose-Capillary 108 (*) 70 - 99 mg/dL   Comment 1 Notify RN    GLUCOSE, CAPILLARY     Status: Abnormal   Collection Time    04/21/13  4:39 PM      Result Value Ref Range   Glucose-Capillary 104 (*) 70 - 99 mg/dL   Comment 1 Notify RN    GLUCOSE, CAPILLARY     Status: Abnormal   Collection Time    04/21/13  9:02 PM      Result Value Ref Range   Glucose-Capillary 122 (*) 70 - 99 mg/dL   Comment 1 Notify RN    GLUCOSE, CAPILLARY     Status: Abnormal   Collection Time    04/22/13  7:24 AM      Result Value Ref Range   Glucose-Capillary 111 (*) 70 - 99 mg/dL  GLUCOSE, CAPILLARY     Status: Abnormal   Collection Time    04/22/13 11:29 AM      Result Value Ref Range   Glucose-Capillary 132 (*) 70 - 99 mg/dL  GLUCOSE, CAPILLARY     Status: Abnormal   Collection Time    04/22/13  4:33 PM      Result Value Ref Range   Glucose-Capillary 102 (*) 70 - 99 mg/dL  GLUCOSE, CAPILLARY     Status: Abnormal   Collection Time    04/22/13  9:21 PM      Result Value Ref Range   Glucose-Capillary 102 (*) 70 - 99 mg/dL  BASIC METABOLIC PANEL     Status: Abnormal   Collection Time    04/23/13  5:55 AM      Result Value Ref Range   Sodium 142  137 - 147 mEq/L   Potassium 3.5 (*) 3.7 - 5.3 mEq/L   Chloride 106  96 - 112 mEq/L   CO2 22  19 - 32 mEq/L   Glucose,  Bld 118 (*) 70 - 99 mg/dL   BUN 24 (*) 6 - 23 mg/dL   Creatinine, Ser 1.35 (*) 0.50 - 1.10 mg/dL   Calcium 8.8  8.4 - 10.5 mg/dL   GFR calc non Af Amer 41 (*) >90 mL/min   GFR calc Af Amer 48 (*) >90 mL/min   Comment: (NOTE)     The eGFR has been calculated using the CKD EPI equation.     This calculation has not been validated in all clinical situations.     eGFR's persistently <90 mL/min signify possible Chronic Kidney     Disease.  GLUCOSE, CAPILLARY     Status: Abnormal   Collection Time    04/23/13  7:33 AM      Result Value Ref Range   Glucose-Capillary 113 (*) 70 - 99 mg/dL   Comment 1 Notify RN    GLUCOSE, CAPILLARY     Status: Abnormal   Collection Time    04/23/13 11:12 AM      Result Value Ref Range  Glucose-Capillary 118 (*) 70 - 99 mg/dL   Comment 1 Notify RN    GLUCOSE, CAPILLARY     Status: Abnormal   Collection Time    04/23/13  4:25 PM      Result Value Ref Range   Glucose-Capillary 104 (*) 70 - 99 mg/dL   Comment 1 Notify RN    GLUCOSE, CAPILLARY     Status: Abnormal   Collection Time    04/23/13  8:54 PM      Result Value Ref Range   Glucose-Capillary 120 (*) 70 - 99 mg/dL  GLUCOSE, CAPILLARY     Status: Abnormal   Collection Time    04/24/13  7:30 AM      Result Value Ref Range   Glucose-Capillary 111 (*) 70 - 99 mg/dL   Comment 1 Notify RN        General: No acute distress Mood and affect are appropriate Heart: Regular rate and rhythm no rubs murmurs or extra sounds Lungs: Clear to auscultation, breathing unlabored, no rales or wheezes Abdomen: Positive bowel sounds, soft nontender to palpation, nondistended Extremities: No clubbing, cyanosis, or edema Skin: No evidence of breakdown, no evidence of rash Neurologic: Cranial nerves II through XII intact, motor strength is 5/5 in Right deltoid, bicep, tricep, grip, hip flexor, knee extensors, ankle dorsiflexor and plantar flexor 0/5 on left side except trace Left Hip add Sensory exam normal  sensation to light touch and proprioception in right upper and lower extremities, feels deep pressure on left side Cerebellar exam cannot assess on left secondary to weakness Musculoskeletal: Pain with Left wrist ROM. Right SCM slightly tight and painful Right gaze preference.   Assessment/Plan: 1. Functional deficits secondary to Right PCA infarct with Left HP, L HH which require 3+ hours per day of interdisciplinary therapy in a comprehensive inpatient rehab setting. Physiatrist is providing close team supervision and 24 hour management of active medical problems listed below. Physiatrist and rehab team continue to assess barriers to discharge/monitor patient progress toward functional and medical goals. FIM: FIM - Bathing Bathing Steps Patient Completed: Chest;Abdomen;Right upper leg;Left upper leg Bathing: 2: Max-Patient completes 3-4 [id number] parts or 25-49%  FIM - Upper Body Dressing/Undressing Upper body dressing/undressing steps patient completed: Thread/unthread right sleeve of pullover shirt/dresss;Put head through opening of pull over shirt/dress Upper body dressing/undressing: 3: Mod-Patient completed 50-74% of tasks FIM - Lower Body Dressing/Undressing Lower body dressing/undressing: 1: Total-Patient completed less than 25% of tasks  FIM - TMusicianDevices: Grab bar or rail for support Toileting: 1: Total-Patient completed zero steps, helper did all 3  FIM - TRadio producerDevices: Grab bars Toilet Transfers: 2-From toilet/BSC: Max A (lift and lower assist);2-To toilet/BSC: Max A (lift and lower assist)  FIM - BEngineer, siteAssistive Devices: Arm rests;Bed rails Bed/Chair Transfer: 3: Bed > Chair or W/C: Mod A (lift or lower assist);3: Chair or W/C > Bed: Mod A (lift or lower assist)  FIM - Locomotion: Wheelchair Distance: 20 Locomotion: Wheelchair: 1: Travels less than 50 ft with moderate assistance  (Pt: 50 - 74%) FIM - Locomotion: Ambulation Locomotion: Ambulation Assistive Devices: Other (comment) Ambulation/Gait Assistance: 1: +2 Total assist;Other (comment) (w/c follow) Locomotion: Ambulation: 0: Activity did not occur  Comprehension Comprehension Mode: Auditory Comprehension: 4-Understands basic 75 - 89% of the time/requires cueing 10 - 24% of the time  Expression Expression Mode: Verbal Expression: 5-Expresses complex 90% of the time/cues < 10% of the time  Social Interaction  Social Interaction: 2-Interacts appropriately 25 - 49% of time - Needs frequent redirection.  Problem Solving Problem Solving: 2-Solves basic 25 - 49% of the time - needs direction more than half the time to initiate, plan or complete simple activities  Memory Memory: 3-Recognizes or recalls 50 - 74% of the time/requires cueing 25 - 49% of the time  Medical Problem List and Plan:  left cerebellar infarct with extension and new R-PCA infarct affecting right occipital lobe, mesial and posterior right temporal lobe, mid brain and posterior right thalamus  -currently on ECASA and plavix for seconary stroke prevention  1. DVT Prophylaxis/Anticoagulation: SCD's, sq heparin.  2. Fibromyalgia/Lleft shoulder pain due to subluxation/ Pain Management:nio pain c/os today3. Anxiety disorder/ Mood: Poor awareness with apathy noted. Trial ritalin, titrate dose LCSW to follow for support and evaluation.  4. Neuropsych: This patient is not capable of making decisions on her own behalf.  5. HTN: will monitor with bid checks. Continue catapress, atenolol, norvasc and hydralazine. Generally improved. Sometimes her anxiety affects----bp's are good in the am and peak during the mid day typically---may benefit from adjustment to timing of medications---increase hydralazine to TID today 6. Borderline diabetes: Hgb A1c-6.3/MFG-134. CM diet restrictions. SSI for elevated BS---most thickened liquids are sweetened.  7:  Dyslipidemia: Continue zocor  8. CKD stage 3:  serial checks.  9. Dysphagia: continue D2,nectar liquids with aspiration precautions. IVF at bedtime to maintain adequate hydration. Patient/family non-compliant--continue to educate on aspiration risk.  10. Constipation: Continue miralax and senna.  11. Hyperactive bladder: check PVRs, UA/UCS negative for infection    LOS (Days) 7  A FACE TO FACE EVALUATION WAS PERFORMED  Jeylin Woodmansee E 04/24/2013, 8:29 AM

## 2013-04-25 ENCOUNTER — Inpatient Hospital Stay (HOSPITAL_COMMUNITY): Payer: 59 | Admitting: Physical Therapy

## 2013-04-25 ENCOUNTER — Inpatient Hospital Stay (HOSPITAL_COMMUNITY): Payer: 59

## 2013-04-25 ENCOUNTER — Inpatient Hospital Stay (HOSPITAL_COMMUNITY): Payer: 59 | Admitting: *Deleted

## 2013-04-25 LAB — GLUCOSE, CAPILLARY
Glucose-Capillary: 114 mg/dL — ABNORMAL HIGH (ref 70–99)
Glucose-Capillary: 116 mg/dL — ABNORMAL HIGH (ref 70–99)
Glucose-Capillary: 103 mg/dL — ABNORMAL HIGH (ref 70–99)
Glucose-Capillary: 91 mg/dL (ref 70–99)

## 2013-04-25 MED ORDER — CLONAZEPAM 0.5 MG PO TABS
0.2500 mg | ORAL_TABLET | Freq: Two times a day (BID) | ORAL | Status: DC | PRN
  Administered 2013-05-08: 0.25 mg via ORAL
  Filled 2013-04-25: qty 1

## 2013-04-25 MED ORDER — HYDRALAZINE HCL 25 MG PO TABS
25.0000 mg | ORAL_TABLET | Freq: Four times a day (QID) | ORAL | Status: DC
  Administered 2013-04-25 – 2013-04-29 (×16): 25 mg via ORAL
  Filled 2013-04-25 (×20): qty 1

## 2013-04-25 NOTE — Progress Notes (Signed)
Physical Therapy Weekly Progress Note  Patient Details  Name: Kelli [name] MRN: [id number] Date of Birth: 03/14/1950  Today's Date: 04/25/2013 Time: 0802-0859 and 0240-9735 Time Calculation (min): 57 min and 26 min  Patient has met 3 of 5 short term goals. Pt exhibits improved independence with bed mobility, functional transfers, and w/c mobility. STG for sit<>stand with mod A partially met because pt has performed transfer with mod A only while using RUE to pull up; STG modified to specify mod A with RUE at w/c arm rest. STG for gait not met, as pt continues to require +2A for safety with ambulation and sessions thus far have focused on postural stability and basic transfers.  Patient continues to demonstrate the following deficits: L hemiparesis, limited sustained attention, decreased awareness, L-sided inattention and therefore will continue to benefit from skilled PT intervention to enhance overall performance with activity tolerance, balance, postural control, ability to compensate for deficits, functional use of  left upper extremity and left lower extremity, attention and awareness.  Patient progressing toward long term goals..  Continue plan of care.  PT Short Term Goals Week 1:  PT Short Term Goal 1 (Week 1): Pt to consistently perform supine<>sit with mod A with HOB flat using bed rail. PT Short Term Goal 1 - Progress (Week 1): Met PT Short Term Goal 2 (Week 1): Pt to consistently perform bed<>chair transfer with mod A. PT Short Term Goal 2 - Progress (Week 1): Met PT Short Term Goal 3 (Week 1): Pt to perform w/c mobility x50' in controlled environment with mod A. PT Short Term Goal 3 - Progress (Week 1): Met PT Short Term Goal 4 (Week 1): Pt to perform sit<>stand with mod A. PT Short Term Goal 4 - Progress (Week 1): Partly met PT Short Term Goal 5 (Week 1): Pt to perform gait x15' in controlled environment with +1Total A. PT Short Term Goal 5 - Progress (Week 1): Progressing toward  goal Week 2:  PT Short Term Goal 1 (Week 2): Pt to consistently perform supine<>sit with min A with HOB flat without rail. PT Short Term Goal 2 (Week 2): Pt to consistently perform bed<>chair transfer with min A. PT Short Term Goal 3 (Week 2): Pt to perform w/c mobility x50' in controlled environment with min A and min cueing for L-sided attention. PT Short Term Goal 4 (Week 2): Pt to perform sit<>stand with mod A using w/c arm rest. PT Short Term Goal 5 (Week 2): Pt to perform gait x15' in controlled encironment with +1Total A.  Skilled Therapeutic Interventions/Progress Updates:    Treatment Session 1: Pt received supine in bed; asleep and difficult to arouse. Pt oriented x3 to self, place, and situation. Unable to see visual aid to orient self to date. Performed supine>sit using rail with HOB flat with mod A to initiate LLE movement, to lift trunk. Seated EOB, pt performed static/dynamic sitting balance. See below for detailed description of NMR.   Per pt request to use bathroom, performed squat pivot transfer from bed>w/c with mod A. Toilet transfer (stand pivot) with max A using grab bars. Pt continent of bowel; RN notified. Performed multiple sit<>stand transfers from w/c with RUE support at sink with min guard-min A. Static standing with RUE support at sink 2 trials x30-45 seconds to don clean brief. Therapist departed with pt seated in w/c with quick release belt on for safety and all needs within reach.   Treatment Session 2: Pt received seated in w/c; agreeable  to session, which focused on sustained attention, L-sided attention with w/c mobility. Pt performed self-propulsion of w/c x55' using R hemi technique with min A, max multimodal cueing for L-sided obstacle negotiation. Pt required frequent redirection due to internal distraction. Therapist departed with pt seated in w/c with quick release belt on for safety and all needs within reach.  Therapy Documentation Precautions:   Precautions Precautions: Fall Precaution Comments: Lt field deficit and Lt inattention; LUE subluxation  Restrictions Weight Bearing Restrictions: No Pain: Pain Assessment Pain Assessment: FLACC Pain Score: 2  Pain Type: Acute pain Pain Location: Shoulder Pain Orientation: Left Pain Descriptors / Indicators: Discomfort Pain Onset: On-going Pain Intervention(s): Repositioned;Other (Comment) (RN aware) Multiple Pain Sites: No NMR:   AM session: seated EOB with bilat UE support at bed surface x13 minutes with manual stabilization of LUE, max verbal cueing for postural awareness. Transitioned to medial/lateral weight shifting to increase LUE weightbearing to increase proprioceptive input. Performed scooting to R side x3, to L side x5 reps with min A for manual stabilization of LUE.   See FIM for current functional status  Therapy/Group: Individual Therapy  Hobble, Malva Cogan 04/25/2013, 10:51 AM

## 2013-04-25 NOTE — Progress Notes (Signed)
Subjective/Complaints: 63 y.o. 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 She was admitted on 04/08/2013 with dizziness, N/V as well as left-sided numbness and weakness. MRI of the brain showed acute infarct in the left cerebellum.   Pt recognizes my voice, you're the Dr., needs cues to open eyes ROS:  Cannot obrtain secondary to mental status Objective: Vital Signs: Blood pressure 193/76, pulse 63, temperature 98 F (36.7 C), temperature source Oral, resp. rate 18, weight 88.406 kg (194 lb 14.4 oz), SpO2 95.00%. Ct Angio Chest Aortic Dissect W &/or W/o  04/17/2013   CLINICAL DATA:  Extensive atheromatous plaque in the thoracic aorta. Evaluate for potential penetrating aortic ulcer.  EXAM: CT ANGIOGRAPHY CHEST WITH CONTRAST  TECHNIQUE: Multidetector CT imaging of the chest was performed using the standard protocol during bolus administration of intravenous contrast. Multiplanar CT image reconstructions and MIPs were obtained to evaluate the vascular anatomy.  CONTRAST:  64m OMNIPAQUE IOHEXOL 350 MG/ML SOLN  COMPARISON:  No priors.  FINDINGS: Mediastinum: Precontrast images demonstrate no crescentic high attenuation associated with the wall of the thoracic aorta to suggest acute intramural hemorrhage at this time. Post-contrast images demonstrate extensive atheromatous plaque throughout the distal aortic arch and descending thoracic aorta. Some of these aortic plaques are ulcerated, best demonstrated on image 81 of series 5 in the distal descending thoracic aorta, but there is no frank penetrating aortic ulcer or signs of dissection at this time. Atheromatous plaque in the great vessels of the mediastinum is also noted, including either high-grade stenosis or complete occlusion of the proximal left subclavian artery. Flow on the distal left  subclavian artery is noted, either related to incomplete obstruction, or collateral pathways. Heart size is borderline enlarged. There is no significant pericardial fluid, thickening or pericardial calcification. Calcifications of the mitral annulus. Small hiatal hernia. No pathologically enlarged mediastinal or hilar lymph nodes.  Lungs/Pleura: Linear opacities in the left lower lobe likely reflect chronic scarring. No acute consolidative airspace disease. No pleural effusions. No definite suspicious appearing pulmonary nodules or masses are identified.  Upper Abdomen: Unremarkable.  Musculoskeletal: There are no aggressive appearing lytic or blastic lesions noted in the visualized portions of the skeleton.  Review of the MIP images confirms the above findings.  IMPRESSION: 1. Extensive atheromatous plaque in the thoracic aorta and great vessels of the mediastinum. Although there are several ulcerated atheromatous plaques, there is no evidence of frank penetrating aortic ulcer or thoracic aortic dissection at this time. 2. There is, however, either high-grade stenosis or complete occlusion of the proximal left subclavian artery, with distal flow in the left subclavian artery related to either incomplete occlusion or collateralization. 3. Small hiatal hernia.   Electronically Signed   By: DVinnie LangtonM.D.   On: 04/17/2013 16:32   Results for orders placed during the hospital encounter of 04/17/13 (from the past 72 hour(s))  GLUCOSE, CAPILLARY     Status: Abnormal   Collection Time    04/22/13 11:29 AM      Result Value Ref Range   Glucose-Capillary 132 (*) 70 - 99 mg/dL  GLUCOSE, CAPILLARY     Status: Abnormal   Collection Time    04/22/13  4:33 PM      Result Value Ref Range   Glucose-Capillary 102 (*) 70 - 99 mg/dL  GLUCOSE, CAPILLARY     Status: Abnormal   Collection Time  04/22/13  9:21 PM      Result Value Ref Range   Glucose-Capillary 102 (*) 70 - 99 mg/dL  BASIC METABOLIC PANEL      Status: Abnormal   Collection Time    04/23/13  5:55 AM      Result Value Ref Range   Sodium 142  137 - 147 mEq/L   Potassium 3.5 (*) 3.7 - 5.3 mEq/L   Chloride 106  96 - 112 mEq/L   CO2 22  19 - 32 mEq/L   Glucose, Bld 118 (*) 70 - 99 mg/dL   BUN 24 (*) 6 - 23 mg/dL   Creatinine, Ser 1.35 (*) 0.50 - 1.10 mg/dL   Calcium 8.8  8.4 - 10.5 mg/dL   GFR calc non Af Amer 41 (*) >90 mL/min   GFR calc Af Amer 48 (*) >90 mL/min   Comment: (NOTE)     The eGFR has been calculated using the CKD EPI equation.     This calculation has not been validated in all clinical situations.     eGFR's persistently <90 mL/min signify possible Chronic Kidney     Disease.  GLUCOSE, CAPILLARY     Status: Abnormal   Collection Time    04/23/13  7:33 AM      Result Value Ref Range   Glucose-Capillary 113 (*) 70 - 99 mg/dL   Comment 1 Notify RN    GLUCOSE, CAPILLARY     Status: Abnormal   Collection Time    04/23/13 11:12 AM      Result Value Ref Range   Glucose-Capillary 118 (*) 70 - 99 mg/dL   Comment 1 Notify RN    GLUCOSE, CAPILLARY     Status: Abnormal   Collection Time    04/23/13  4:25 PM      Result Value Ref Range   Glucose-Capillary 104 (*) 70 - 99 mg/dL   Comment 1 Notify RN    GLUCOSE, CAPILLARY     Status: Abnormal   Collection Time    04/23/13  8:54 PM      Result Value Ref Range   Glucose-Capillary 120 (*) 70 - 99 mg/dL  GLUCOSE, CAPILLARY     Status: Abnormal   Collection Time    04/24/13  7:30 AM      Result Value Ref Range   Glucose-Capillary 111 (*) 70 - 99 mg/dL   Comment 1 Notify RN    GLUCOSE, CAPILLARY     Status: Abnormal   Collection Time    04/24/13 11:11 AM      Result Value Ref Range   Glucose-Capillary 112 (*) 70 - 99 mg/dL   Comment 1 Notify RN    GLUCOSE, CAPILLARY     Status: None   Collection Time    04/24/13  5:10 PM      Result Value Ref Range   Glucose-Capillary 99  70 - 99 mg/dL   Comment 1 Notify RN    GLUCOSE, CAPILLARY     Status: None    Collection Time    04/24/13  9:23 PM      Result Value Ref Range   Glucose-Capillary 99  70 - 99 mg/dL  GLUCOSE, CAPILLARY     Status: Abnormal   Collection Time    04/25/13  7:25 AM      Result Value Ref Range   Glucose-Capillary 114 (*) 70 - 99 mg/dL   Comment 1 Notify RN        General:  No acute distress Mood and affect are appropriate Heart: Regular rate and rhythm no rubs murmurs or extra sounds Lungs: Clear to auscultation, breathing unlabored, no rales or wheezes Abdomen: Positive bowel sounds, soft nontender to palpation, nondistended Extremities: No clubbing, cyanosis, or edema Skin: No evidence of breakdown, no evidence of rash Neurologic: Cranial nerves II through XII intact, motor strength is 5/5 in Right deltoid, bicep, tricep, grip, hip flexor, knee extensors, ankle dorsiflexor and plantar flexor 0/5 on left side except trace Left Hip add Sensory exam normal sensation to light touch and proprioception in right upper and lower extremities, feels deep pressure on left side Cerebellar exam cannot assess on left secondary to weakness Musculoskeletal: Pain with Left wrist ROM. Right SCM slightly tight and painful Right gaze preference.   Assessment/Plan: 1. Functional deficits secondary to Right PCA infarct with Left HP, L HH which require 3+ hours per day of interdisciplinary therapy in a comprehensive inpatient rehab setting. Physiatrist is providing close team supervision and 24 hour management of active medical problems listed below. Physiatrist and rehab team continue to assess barriers to discharge/monitor patient progress toward functional and medical goals. Team conference today please see physician documentation under team conference tab, met with team face-to-face to discuss problems,progress, and goals. Formulized individual treatment plan based on medical history, underlying problem and comorbidities. FIM: FIM - Bathing Bathing Steps Patient Completed:  Chest;Abdomen;Right upper leg;Front perineal area Bathing: 2: Max-Patient completes 3-4 26f10 parts or 25-49%  FIM - Upper Body Dressing/Undressing Upper body dressing/undressing steps patient completed: Thread/unthread right sleeve of pullover shirt/dresss;Put head through opening of pull over shirt/dress Upper body dressing/undressing: 3: Mod-Patient completed 50-74% of tasks FIM - Lower Body Dressing/Undressing Lower body dressing/undressing steps patient completed: Thread/unthread right pants leg Lower body dressing/undressing: 1: Total-Patient completed less than 25% of tasks  FIM - TMusicianDevices: Grab bar or rail for support Toileting: 1: Total-Patient completed zero steps, helper did all 3  FIM - TRadio producerDevices: Grab bars Toilet Transfers: 2-From toilet/BSC: Max A (lift and lower assist);2-To toilet/BSC: Max A (lift and lower assist)  FIM - BControl and instrumentation engineerDevices: Arm rests Bed/Chair Transfer: 3: Chair or W/C > Bed: Mod A (lift or lower assist);3: Bed > Chair or W/C: Mod A (lift or lower assist)  FIM - Locomotion: Wheelchair Distance: 100 Locomotion: Wheelchair: 1: Total Assistance/staff pushes wheelchair (Pt<25%) FIM - Locomotion: Ambulation Locomotion: Ambulation Assistive Devices: Other (comment) (Raised mat table (on R side)) Ambulation/Gait Assistance: 1: +2 Total assist;Other (comment) Locomotion: Ambulation: 0: Activity did not occur  Comprehension Comprehension Mode: Auditory Comprehension: 4-Understands basic 75 - 89% of the time/requires cueing 10 - 24% of the time  Expression Expression Mode: Verbal Expression: 4-Expresses basic 75 - 89% of the time/requires cueing 10 - 24% of the time. Needs helper to occlude trach/needs to repeat words.  Social Interaction Social Interaction: 3-Interacts appropriately 50 - 74% of the time - May be physically or verbally  inappropriate.  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 DVT Prophylaxis/Anticoagulation: SCD's, sq heparin.  2. Fibromyalgia/Lleft shoulder pain due to subluxation/ Pain Management:nio pain c/os today3. Anxiety disorder/ Mood: Poor awareness with apathy noted. Trial ritalin, titrate dose LCSW to follow for support and evaluation.  4. Neuropsych: This patient is not capable of making decisions on her own behalf.  5. HTN: will monitor with bid checks. Continue catapress, atenolol, norvasc and hydralazine. Generally improved. Sometimes her anxiety affects----bp's are good in the am and peak during the mid day typically---may benefit from adjustment to timing of medications---increase hydralazine to TID today 6. Borderline diabetes: Hgb A1c-6.3/MFG-134. CM diet restrictions. SSI for elevated BS---most thickened liquids are sweetened.  7: Dyslipidemia: Continue zocor  8. CKD stage 3:  serial checks.  9. Dysphagia: continue D2,nectar liquids with aspiration precautions. IVF at bedtime to maintain adequate hydration. Patient/family non-compliant--continue to educate on aspiration risk.  10. Constipation: Continue miralax and senna.  11. Hyperactive bladder:secondary to CVA   LOS (Days) 8  A FACE TO FACE EVALUATION WAS PERFORMED  KIRSTEINS,ANDREW E 04/25/2013, 8:03 AM

## 2013-04-25 NOTE — Plan of Care (Signed)
Problem: RH BLADDER ELIMINATION Goal: RH STG MANAGE BLADDER WITH ASSISTANCE STG Manage Bladder With min Assistance  Outcome: Progressing Incont. At night.

## 2013-04-25 NOTE — Progress Notes (Signed)
Speech Language Pathology Weekly Progress and Session Notes  Patient Details  Name: Kelli [name] MRN: [id number] Date of Birth: August 07, 1950  Today's Date: 04/25/2013 Time: 3500-9381 Time Calculation (min): 46 min  Short Term Goals: Week 1: SLP Short Term Goal 1 (Week 1): Pt will utilize safe swallowing strategies with current diet with Min cues SLP Short Term Goal 1 - Progress (Week 1): Not met SLP Short Term Goal 2 (Week 1): Pt attend to her left visual field during basic functional task with Mod cues SLP Short Term Goal 2 - Progress (Week 1): Not met SLP Short Term Goal 3 (Week 1): Pt will sustain attention to basic functional task for 5 minutes with Mod cues SLP Short Term Goal 3 - Progress (Week 1): Not met SLP Short Term Goal 4 (Week 1): Pt will utilize environmental cues to demonstrate orientation x4 with Mod cues SLP Short Term Goal 4 - Progress (Week 1): Not met SLP Short Term Goal 5 (Week 1): Pt will demonstrate adequate problem solving during basic functional task with Mod cues SLP Short Term Goal 5 - Progress (Week 1): Not met SLP Short Term Goal 6 (Week 1): Pt will demonstrate effective mastication and oral clearance with Dys 3 textures with Min cues SLP Short Term Goal 6 - Progress (Week 1): Not met    New Short Term Goals: Week 2: SLP Short Term Goal 1 (Week 2): Pt will utilize safe swallowing strategies with current diet with Min cues SLP Short Term Goal 2 (Week 2): Pt attend to her left visual field during basic functional task with Mod cues SLP Short Term Goal 3 (Week 2): Pt will sustain attention to basic functional task for 5 minutes with Mod cues SLP Short Term Goal 4 (Week 2): Pt will utilize environmental cues to demonstrate orientation x4 with Mod cues SLP Short Term Goal 5 (Week 2): Pt will demonstrate adequate problem solving during basic functional task with Mod cues SLP Short Term Goal 6 (Week 2): Pt will demonstrate effective mastication and oral clearance with  Dys 3 textures with Min cues  Weekly Progress Updates: Pt has met 0 out of 6 STGs during this reporting period, with progress in therapy primarily impacted by impairments with sustained attention, requiring Max cues to attend to tasks for brief periods of time. Pt appears to be primarily distracted by internal distractors. She is consuming Dys 2 textures and nectar thick liquids as well as ice chips per the water protocol, and has begun trials of advanced solids. She continues to require Max cues for left-sided attention and problem solving, with Mod cues for recall of new information. Pt will continue to require skilled SLP services to maximize swallowing safety and cognitive function prior to discharge home with family.   Intensity: Minumum of 1-2 x/day, 30 to 90 minutes Frequency: 5 out of 7 days Duration/Length of Stay: 22-28 days Treatment/Interventions: Cognitive remediation/compensation;Cueing hierarchy;Dysphagia/aspiration precaution training;Environmental controls;Functional tasks;Internal/external aids;Patient/family education;Oral motor exercises   Daily Session Skilled Therapeutic Interventions: Skilled treatment focused on swallowing and cognitive goals. SLP facilitated session with Max cues for brief periods of sustained attention, due primarily to internal distracters. Pt consumed nectar thick liquids and trials of Dys 3 textures, requiring Mod cues for adequate mastication and oral clearance, with pt becoming easily distracted and forgetting that she had food in her mouth. Wet vocal quality was noted x1, which cleared with a cued throat clear. Pt required Max cues to attend to her left side during structured table  task. SLP provided Min cues for basic problem solving for pt to answer room phone when it rang.     FIM:  Comprehension Comprehension Mode: Auditory Comprehension: 4-Understands basic 75 - 89% of the time/requires cueing 10 - 24% of the time Expression Expression Mode:  Verbal Expression: 5-Expresses basic 90% of the time/requires cueing < 10% of the time. Social Interaction Social Interaction: 2-Interacts appropriately 25 - 49% of time - Needs frequent redirection. Problem Solving Problem Solving: 2-Solves basic 25 - 49% of the time - needs direction more than half the time to initiate, plan or complete simple activities Memory Memory: 3-Recognizes or recalls 50 - 74% of the time/requires cueing 25 - 49% of the time FIM - Eating Eating Activity: 5: Needs verbal cues/supervision;5: Set-up assist for open containers General    Pain    Therapy/Group: Individual Therapy   Germain Osgood, M.A. CCC-SLP [phone]  Germain Osgood 04/25/2013, 3:42 PM

## 2013-04-25 NOTE — Plan of Care (Signed)
Problem: RH BOWEL ELIMINATION Goal: RH STG MANAGE BOWEL WITH ASSISTANCE STG Manage Bowel with min Assistance.  Outcome: Not Progressing No bm since 04/21/13 on senna and miralax

## 2013-04-25 NOTE — Progress Notes (Signed)
Occupational Therapy Weekly Progress Note  Patient Details  Name: Kelli [name] MRN: [id number] Date of Birth: 1950-06-06  Today's Date: 04/25/2013 Time: 8675-4492  Time calculation (min): 56 min   Patient has met 4 of 4 short term goals.  Patient is progressing slowly in therapy and has improved from total-max assist stand functional transfers to consistently max assist. Patient has L inattention/neglect, requiring mod-max cues and max cues for positioning during self-care tasks. Patient with decreased sustained attention requiring max cues for re-direction to task.   Patient continues to demonstrate the following deficits: L hemiplegia, flaccid LUE, decreased activity tolerance, decreased postural control in sitting and standing, decreased standing tolerance, decreased awareness, decreased coordination, decreased strength, and decreased ability to compensate for deficits and therefore will continue to benefit from skilled OT intervention to enhance overall performance with BADLs, postural control in sitting and standing, functional transfers, overall awareness, and ability to compensate for deficits.  Patient progressing toward long term goals..  Continue plan of care.  OT Short Term Goals Week 1:  OT Short Term Goal 1 (Week 1): Patient will tolerate sitting EOB unsupport for 15 seconds with mod assist OT Short Term Goal 1 - Progress (Week 1): Met OT Short Term Goal 2 (Week 1): Patient will locate self-care item on left side with mod cues OT Short Term Goal 2 - Progress (Week 1): Met OT Short Term Goal 3 (Week 1): Patient will complete bathing task with mod assist OT Short Term Goal 3 - Progress (Week 1): Met OT Short Term Goal 4 (Week 1): Patient will complete toilet transfer to BSC/toilet with max assist consistently OT Short Term Goal 4 - Progress (Week 1): Met Week 2:  OT Short Term Goal 1 (Week 2): Pt will sit unsupported EOB/EOM for 30 seconds with min assist while participating in  functional task OT Short Term Goal 2 (Week 2): Pt will complete toilet transfer with mod assist  OT Short Term Goal 3 (Week 2): Pt will assist with positioning of LUE during self-care tasks with mod cueing 50% of session  OT Short Term Goal 4 (Week 2): Pt will complete toilet task with max assist   Skilled Therapeutic Interventions/Progress Updates:    Pt seen for ADL retraining with focus on attention to L, postural control in sitting and standing, overall attention to task, and awareness of L UE/LE during self-care tasks. Pt received sitting in w/c. [place] slightly past midline to L visual field requiring mod cues to locate. Pt with max cues for attention to task as she would begin discussing random things throughout session. Pt completed sit<>stand 4x at sink with min-mod assist and verbal cues for hand positioning. Attempted to utilize mirror as visual feedback for postural control as well as manual facilitation at ribcage. Pt required total assist for positioning of LUE/LLE with sit<>stand. Pt improved to min cues for donning shirt with mod physical assist. Therapist assisted with positioning of LLE for donning pants. Pt requesting to use lotion and applied HOH assist to LUE to apply to upper LLE. Practiced toilet transfer requiring max assist for stand pivot transfer and blocking L knee. At end of session pt left sitting in w/c with arm trough donned and QRB donned. All needs in reach.   Therapy Documentation Precautions:  Precautions Precautions: Fall Precaution Comments: Lt field deficit and Lt inattention; LUE subluxation  Restrictions Weight Bearing Restrictions: No General:   Vital Signs:   Pain: Pt with no report of pain during  therapy session.   Other Treatments:    See FIM for current functional status  Therapy/Group: Individual Therapy  Duayne Cal 04/25/2013, 10:46 AM

## 2013-04-25 NOTE — Progress Notes (Signed)
Recreational Therapy Assessment and Plan  Patient Details  Name: Kelli [name] MRN: [id number] Date of Birth: [birth year] Today's Date: 04/25/2013  Rehab Potential: Good ELOS: 3 weeks   Assessment Clinical Impression: Problem List:  Patient Active Problem List    Diagnosis  Date Noted   .  Acute CVA (cerebrovascular accident): R PCA infarct per MRI 04/13/13  04/13/2013   .  CVA (cerebral infarction)  04/08/2013   .  Acute ischemic stroke  04/08/2013   .  Diplopia  04/08/2013   .  Left-sided weakness  04/08/2013   .  Vertigo  04/08/2013   .  Paresthesias in left hand  04/08/2013   .  Pre-diabetes  04/08/2013   .  Cerebellar stroke  04/08/2013   .  Dizziness and giddiness  04/08/2013   .  Stroke  04/08/2013   .  Depression  04/06/2013   .  Peripheral arterial disease  03/16/2013   .  History of stroke  01/30/2013   .  Precordial pain  01/16/2013   .  CKD (chronic kidney disease) stage 3, GFR 30-59 ml/min  01/16/2013   .  Anxiety state, unspecified  12/08/2012   .  Lumbago  12/08/2012   .  Urinary frequency  11/14/2012   .  Occlusion and stenosis of carotid artery with cerebral infarction  10/05/2012   .  Mixed hyperlipidemia  10/05/2012   .  Renal insufficiency  10/05/2012   .  Hypertension, uncontrolled  10/04/2012   .  Tobacco abuse  10/04/2012   .  Obesity  10/04/2012    Past Medical History:  Past Medical History   Diagnosis  Date   .  Essential hypertension, benign    .  Fibromyalgia    .  Cerebral infarction  Aug 2014     Bihemispheric watershed infarcts   .  Mixed hyperlipidemia    .  Borderline diabetes    .  Urinary incontinence    .  Carotid artery occlusion      Occluded RICA, status post left CEA August 2014 - Dr. Donnetta Hutching   .  CKD (chronic kidney disease) stage 3, GFR 30-59 ml/min    .  Cerebral infarction involving left cerebellar artery  Feb 2015     due to small vessel disease    Past Surgical History:  Past Surgical History   Procedure  Laterality   Date   .  Urethral dilation   1980's   .  Combined hysterectomy vaginal w/ mmk / a&p repair   1981   .  Vaginal hysterectomy   1981     "partial" (10/04/2012)   .  Endarterectomy  Left  10/06/2012     Procedure: Carotid Endarterectomy with Finesse patch angioplasty; Surgeon: [name], MD; Location: [place]; Service: Vascular; Laterality: Left;   .  Loop recorder implant   04/16/13     MDT LinQ implanted for cryptogenic stroke   .  [name] without cardioversion  N/A  04/16/2013     Procedure: TRANSESOPHAGEAL ECHOCARDIOGRAM ([name]); Surgeon: Josue Hector, MD; Location: [facility] ENDOSCOPY; Service: Cardiovascular; Laterality: N/A;    Assessment & Plan  Clinical Impression: Patient is a 63 y.o. 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 She was admitted on 04/08/2013 with dizziness, N/V as well as left-sided numbness and weakness. MRI of the brain showed acute infarct  in the left cerebellum. MRA of the head with chronic right ICA artery occlusion and widely patent dominant right vertebral artery. Echocardiogram with ejection fraction of 60% no wall motion abnormalities. Carotid Dopplers showed left CEA patent and right vertebral artery flow is antegrade. Patient did not receive TPA. Neurology services consulted and recommended ASA for thrombotic stroke due to SVD--hold off on trial. maintained on socrates trial,ticagrelor versus aspirin for stroke prevention. Patient with severe HA as well as waxing and waning of left sided weakness with lability. MRI brain repeated 04/13/13 with new restricted diffusion entire R-PCA involving right occipital lobe, mesial and posterior right temporal lobe, mid brain and posterior right thalamus and slight increase in left cerebellar stroke. [name] done negative for LAA thrombus but extensive grade 4 bulky aortic debris and plaque  in distal aorta that could be source of embolus--CT chest recommended to rule out penetrating ulcer--if positive will need dual antiplatelet therapy. Implantable loop recorder placed by Dr. Lovena Le yesterday. Patient with resultant dense Left hemiparesis, left inattention, left HH and dysphagia. Patient transferred to CIR on 04/17/2013.  Pt presents with decreased activity tolerance, decreased functional mobility, decreased balance, left sided weakness, left neglect, decreased attention, decreased safety, decreased memory, decreased problem solving Limiting pt's independence with leisure/community pursuits.   Leisure History/Participation Premorbid leisure interest/current participation: Games - Network engineer - Shopping mall;Community - Grocery store;Community - Travel (Comment);Crafts - Other (Comment) (craft activities with grand daughters) Expression Interests: Music (Comment) Other Leisure Interests: Television;Cooking/Baking Leisure Participation Style: Alone;With Family/Friends Awareness of Community Resources: Good-identify 3 post discharge leisure resources Psychosocial / Spiritual Social interaction - Mood/Behavior: Cooperative Academic librarian Appropriate for Education?: Yes Recreational Therapy Orientation Orientation -Reviewed with patient: Available activity resources Strengths/Weaknesses Patient Strengths/Abilities: Willingness to participate;Active premorbidly Patient weaknesses: Physical limitations TR Patient demonstrates impairments in the following area(s): Edema;Endurance;Motor;Pain;Safety TR Additional Impairment(s): None  Plan Rec Therapy Plan Is patient appropriate for Therapeutic Recreation?: Yes Rehab Potential: Good Treatment times per week: Min 1 time per week >20 minutes Estimated Length of Stay: 3 weeks TR Treatment/Interventions: Adaptive equipment instruction;1:1 session;Balance/vestibular training;Functional mobility training;Community  reintegration;Cognitive remediation/compensation;Patient/family education;Therapeutic activities;Recreation/leisure participation;Therapeutic exercise;UE/LE Coordination activities;Wheelchair propulsion/positioning;Visual/perceptual remediation/compensation Recommendations for other services: Neuropsych  Recommendations for other services: None  Discharge Criteria: Patient will be discharged from TR if patient refuses treatment 3 consecutive times without medical reason.  If treatment goals not met, if there is a change in medical status, if patient makes no progress towards goals or if patient is discharged from hospital.  The above assessment, treatment plan, treatment alternatives and goals were discussed and mutually agreed upon: by patient  [place] 04/25/2013, 12:10 PM

## 2013-04-26 ENCOUNTER — Inpatient Hospital Stay (HOSPITAL_COMMUNITY): Payer: 59 | Admitting: Physical Therapy

## 2013-04-26 ENCOUNTER — Inpatient Hospital Stay (HOSPITAL_COMMUNITY): Payer: 59

## 2013-04-26 LAB — GLUCOSE, CAPILLARY
GLUCOSE-CAPILLARY: 147 mg/dL — AB (ref 70–99)
GLUCOSE-CAPILLARY: 99 mg/dL (ref 70–99)
Glucose-Capillary: 103 mg/dL — ABNORMAL HIGH (ref 70–99)
Glucose-Capillary: 140 mg/dL — ABNORMAL HIGH (ref 70–99)

## 2013-04-26 MED ORDER — OXYBUTYNIN CHLORIDE 5 MG PO TABS
5.0000 mg | ORAL_TABLET | Freq: Every day | ORAL | Status: DC
  Administered 2013-04-26 – 2013-05-04 (×9): 5 mg via ORAL
  Filled 2013-04-26 (×10): qty 1

## 2013-04-26 MED ORDER — HYDROCODONE-ACETAMINOPHEN 5-325 MG PO TABS
1.0000 | ORAL_TABLET | ORAL | Status: DC | PRN
  Administered 2013-04-26 – 2013-05-05 (×7): 2 via ORAL
  Filled 2013-04-26 (×7): qty 2

## 2013-04-26 NOTE — Progress Notes (Signed)
Speech Language Pathology Daily Session Note  Patient Details  Name: Kelli [name] MRN: [id number] Date of Birth: [birth year]  Today's Date: 04/26/2013 Time: [id number] Time Calculation (min): 40 min  Short Term Goals: Week 2: SLP Short Term Goal 1 (Week 2): Pt will utilize safe swallowing strategies with current diet with Min cues SLP Short Term Goal 2 (Week 2): Pt attend to her left visual field during basic functional task with Mod cues SLP Short Term Goal 3 (Week 2): Pt will sustain attention to basic functional task for 5 minutes with Mod cues SLP Short Term Goal 4 (Week 2): Pt will utilize environmental cues to demonstrate orientation x4 with Mod cues SLP Short Term Goal 5 (Week 2): Pt will demonstrate adequate problem solving during basic functional task with Mod cues SLP Short Term Goal 6 (Week 2): Pt will demonstrate effective mastication and oral clearance with Dys 3 textures with Min cues  Skilled Therapeutic Interventions: Treatment focused on cognitive goals. SLP facilitated session with frequent rest breaks for pt to talk about topics of her choosing, in order to encourage attention during structured tasks. Pt utilized this time primarily to discuss current cognitive, physical, and visual deficits, demonstrating intellectual awareness with Mod I. Pt sustained attention to structured table task requiring scanning and matching with Mod cues for sustained attention for up to 5 minute intervals. She required Mod cues to scan to her left visual field. Pt's husband arrived at the end of treatment, and was provided with a brief overview of current treatment plan and goals.   FIM:  Comprehension Comprehension Mode: Auditory (Simultaneous filing. User may not have seen previous data.) Comprehension: 4-Understands basic 75 - 89% of the time/requires cueing 10 - 24% of the time (Simultaneous filing. User may not have seen previous data.) Expression Expression Mode: Verbal (Simultaneous  filing. User may not have seen previous data.) Expression: 5-Expresses basic 90% of the time/requires cueing < 10% of the time. (Simultaneous filing. User may not have seen previous data.) Social Interaction Social Interaction: 5-Interacts appropriately 90% of the time - Needs monitoring or encouragement for participation or interaction. (Simultaneous filing. User may not have seen previous data.) Problem Solving Problem Solving: 2-Solves basic 25 - 49% of the time - needs direction more than half the time to initiate, plan or complete simple activities (Simultaneous filing. User may not have seen previous data.) Memory Memory: 3-Recognizes or recalls 50 - 74% of the time/requires cueing 25 - 49% of the time (Simultaneous filing. User may not have seen previous data.)  Pain Pain Assessment Pain Assessment: No/denies pain  Therapy/Group: Individual Therapy   Germain Osgood, M.A. CCC-SLP [phone]  Germain Osgood 04/26/2013, 3:50 PM

## 2013-04-26 NOTE — Progress Notes (Signed)
Physical Therapy Session Note  Patient Details  Name: Kelli [name] MRN: [id number] Date of Birth: 1951-01-04  Today's Date: 04/26/2013 Time: [id number] Time Calculation (min): 63 min  Short Term Goals: Week 2:  PT Short Term Goal 1 (Week 2): Pt to consistently perform supine<>sit with min A with HOB flat without rail. PT Short Term Goal 2 (Week 2): Pt to consistently perform bed<>chair transfer with min A. PT Short Term Goal 3 (Week 2): Pt to perform w/c mobility x50' in controlled environment with min A and min cueing for L-sided attention. PT Short Term Goal 4 (Week 2): Pt to perform sit<>stand with mod A using w/c arm rest. PT Short Term Goal 5 (Week 2): Pt to perform gait x15' in controlled encironment with +1Total A.  Skilled Therapeutic Interventions/Progress Updates:    2:1. Pt received semi-reclined in bed; lethargic but agreeable to PT. Session focused on gait training, w/c mobility, L-sided attention, and sit<>stand transfers. Supine>sit with max A to lift trunk, max cueing for initiation, technique, sequencing. Pt Seated EOM, pt scooted to EOB with min A, max cueing to reach for R bed rail secondary to significant trunk lean to R side. Pt noted to have soiled brief secondary to urinary incontinent. RN notified. Performed multiple sit<>stand transfers from EOB for peri care and brief change; sit<>stands performed with RUE support at bed surface. Once in standing, pt required RUE support and max A (no L knee stabilization required) for stability and mod cueing for upright posture.   Squat pivot transfer from bed>w/c with mod A, min verbal cueing for hand placement. Transported pt to hallway, where pt performed gait x8', x14' (seated rest break between trials) with R hallway hand rail, ACE bandage at L ankle for dorsiflexion assist, and +2A for w/c follow. Therapist positioned at pt's L side providing max-total A for postural stability and L single limb stance stability; manual facilitation  at L axilla, inferior to R ribcage for upright posture and manual weight shifting bilaterally. Pt able to perform LLE advancement; however, LLE placement manually corrected during ~50% of gait trials. L knee buckling noted x2 episodes when pt distracted. Gait trial ended secondary to increasingly more prominent cervical spine/trunk rotation to R side despite max cueing for L-sided attention.  Applied Theraband to R w/c hand rim to improve pt grip, increase independence with w/c propulsion. Pt performed w/c mobility x100' in controlled environment with R hemi technique and max A, max multimodal cueing for L-sided attention and obstacle negotiation on L side. Tactile cueing at R knee to increase RLE weightbearing, verbal cueing to promote R heel strike. Session ended in pt room, where pt was left seated in w/c with quick release belt on for safety and all needs within reach.   Therapy Documentation Precautions:  Precautions Precautions: Fall Precaution Comments: Lt field deficit and Lt inattention; LUE subluxation  Restrictions Weight Bearing Restrictions: No Vital Signs: Therapy Vitals Pulse Rate: 54 BP: 180/76 mmHg Pain: Pain Assessment Pain Assessment: No/denies pain Pain Score: 0-No pain Locomotion : Ambulation Ambulation/Gait Assistance: 1: +2 Total assist (+2 for w/c follow) Wheelchair Mobility Distance: 100   See FIM for current functional status  Therapy/Group: Individual Therapy  Venera Privott, Malva Cogan 04/26/2013, 11:09 AM

## 2013-04-26 NOTE — Progress Notes (Signed)
Subjective/Complaints: 63 y.o. 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 She was admitted on 04/08/2013 with dizziness, N/V as well as left-sided numbness and weakness.   C/o nocturnal freq and inc, took detrol in past, cannot tolerate nectar liq ROS:  Cannot obrtain secondary to mental status Objective: Vital Signs: Blood pressure 180/76, pulse 54, temperature 98.3 F (36.8 C), temperature source Oral, resp. rate 18, weight 88.406 kg (194 lb 14.4 oz), SpO2 95.00%. Ct Angio Chest Aortic Dissect W &/or W/o  04/17/2013   CLINICAL DATA:  Extensive atheromatous plaque in the thoracic aorta. Evaluate for potential penetrating aortic ulcer.  EXAM: CT ANGIOGRAPHY CHEST WITH CONTRAST  TECHNIQUE: Multidetector CT imaging of the chest was performed using the standard protocol during bolus administration of intravenous contrast. Multiplanar CT image reconstructions and MIPs were obtained to evaluate the vascular anatomy.  CONTRAST:  16mL OMNIPAQUE IOHEXOL 350 MG/ML SOLN  COMPARISON:  No priors.  FINDINGS: Mediastinum: Precontrast images demonstrate no crescentic high attenuation associated with the wall of the thoracic aorta to suggest acute intramural hemorrhage at this time. Post-contrast images demonstrate extensive atheromatous plaque throughout the distal aortic arch and descending thoracic aorta. Some of these aortic plaques are ulcerated, best demonstrated on image 81 of series 5 in the distal descending thoracic aorta, but there is no frank penetrating aortic ulcer or signs of dissection at this time. Atheromatous plaque in the great vessels of the mediastinum is also noted, including either high-grade stenosis or complete occlusion of the proximal left subclavian artery. Flow on the distal left subclavian artery is noted, either related to  incomplete obstruction, or collateral pathways. Heart size is borderline enlarged. There is no significant pericardial fluid, thickening or pericardial calcification. Calcifications of the mitral annulus. Small hiatal hernia. No pathologically enlarged mediastinal or hilar lymph nodes.  Lungs/Pleura: Linear opacities in the left lower lobe likely reflect chronic scarring. No acute consolidative airspace disease. No pleural effusions. No definite suspicious appearing pulmonary nodules or masses are identified.  Upper Abdomen: Unremarkable.  Musculoskeletal: There are no aggressive appearing lytic or blastic lesions noted in the visualized portions of the skeleton.  Review of the MIP images confirms the above findings.  IMPRESSION: 1. Extensive atheromatous plaque in the thoracic aorta and great vessels of the mediastinum. Although there are several ulcerated atheromatous plaques, there is no evidence of frank penetrating aortic ulcer or thoracic aortic dissection at this time. 2. There is, however, either high-grade stenosis or complete occlusion of the proximal left subclavian artery, with distal flow in the left subclavian artery related to either incomplete occlusion or collateralization. 3. Small hiatal hernia.   Electronically Signed   By: Vinnie Langton M.D.   On: 04/17/2013 16:32   Results for orders placed during the hospital encounter of 04/17/13 (from the past 72 hour(s))  GLUCOSE, CAPILLARY     Status: Abnormal   Collection Time    04/23/13  4:25 PM      Result Value Ref Range   Glucose-Capillary 104 (*) 70 - 99 mg/dL   Comment 1 Notify RN    GLUCOSE, CAPILLARY     Status: Abnormal   Collection Time    04/23/13  8:54 PM      Result Value Ref Range   Glucose-Capillary 120 (*) 70 - 99 mg/dL  GLUCOSE, CAPILLARY     Status: Abnormal   Collection Time  04/24/13  7:30 AM      Result Value Ref Range   Glucose-Capillary 111 (*) 70 - 99 mg/dL   Comment 1 Notify RN    GLUCOSE, CAPILLARY      Status: Abnormal   Collection Time    04/24/13 11:11 AM      Result Value Ref Range   Glucose-Capillary 112 (*) 70 - 99 mg/dL   Comment 1 Notify RN    GLUCOSE, CAPILLARY     Status: None   Collection Time    04/24/13  5:10 PM      Result Value Ref Range   Glucose-Capillary 99  70 - 99 mg/dL   Comment 1 Notify RN    GLUCOSE, CAPILLARY     Status: None   Collection Time    04/24/13  9:23 PM      Result Value Ref Range   Glucose-Capillary 99  70 - 99 mg/dL  GLUCOSE, CAPILLARY     Status: Abnormal   Collection Time    04/25/13  7:25 AM      Result Value Ref Range   Glucose-Capillary 114 (*) 70 - 99 mg/dL   Comment 1 Notify RN    GLUCOSE, CAPILLARY     Status: Abnormal   Collection Time    04/25/13 11:55 AM      Result Value Ref Range   Glucose-Capillary 116 (*) 70 - 99 mg/dL   Comment 1 Notify RN    GLUCOSE, CAPILLARY     Status: Abnormal   Collection Time    04/25/13  4:44 PM      Result Value Ref Range   Glucose-Capillary 103 (*) 70 - 99 mg/dL   Comment 1 Notify RN    GLUCOSE, CAPILLARY     Status: None   Collection Time    04/25/13  9:31 PM      Result Value Ref Range   Glucose-Capillary 91  70 - 99 mg/dL  GLUCOSE, CAPILLARY     Status: Abnormal   Collection Time    04/26/13  7:08 AM      Result Value Ref Range   Glucose-Capillary 103 (*) 70 - 99 mg/dL   Comment 1 Notify RN        General: No acute distress Mood and affect are appropriate Heart: Regular rate and rhythm no rubs murmurs or extra sounds Lungs: Clear to auscultation, breathing unlabored, no rales or wheezes Abdomen: Positive bowel sounds, soft nontender to palpation, nondistended Extremities: No clubbing, cyanosis, or edema Skin: No evidence of breakdown, no evidence of rash Neurologic: Cranial nerves II through XII intact, motor strength is 5/5 in Right deltoid, bicep, tricep, grip, hip flexor, knee extensors, ankle dorsiflexor and plantar flexor 0/5 on left side except trace Left Hip add Sensory  exam normal sensation to light touch and proprioception in right upper and lower extremities, feels deep pressure on left side Cerebellar exam cannot assess on left secondary to weakness Musculoskeletal: Pain with Left wrist ROM. Right SCM slightly tight and painful Right gaze preference.   Assessment/Plan: 1. Functional deficits secondary to Right PCA infarct with Left HP, L HH which require 3+ hours per day of interdisciplinary therapy in a comprehensive inpatient rehab setting. Physiatrist is providing close team supervision and 24 hour management of active medical problems listed below. Physiatrist and rehab team continue to assess barriers to discharge/monitor patient progress toward functional and medical goals.  FIM: FIM - Bathing Bathing Steps Patient Completed: Chest;Abdomen;Right upper leg;Front perineal area;Left Arm;Left  upper leg Bathing: 3: Mod-Patient completes 5-7 20f 10 parts or 50-74%  FIM - Upper Body Dressing/Undressing Upper body dressing/undressing steps patient completed: Thread/unthread right sleeve of pullover shirt/dresss;Put head through opening of pull over shirt/dress Upper body dressing/undressing: 3: Mod-Patient completed 50-74% of tasks FIM - Lower Body Dressing/Undressing Lower body dressing/undressing steps patient completed: Thread/unthread right pants leg;Thread/unthread right underwear leg Lower body dressing/undressing: 2: Max-Patient completed 25-49% of tasks  FIM - Musician Devices: Grab bar or rail for support Toileting: 1: Total-Patient completed zero steps, helper did all 3  FIM - Radio producer Devices: Grab bars Toilet Transfers: 2-To toilet/BSC: Max A (lift and lower assist);2-From toilet/BSC: Max A (lift and lower assist)  FIM - Engineer, site Assistive Devices: Arm rests;Bed rails Bed/Chair Transfer: 3: Bed > Chair or W/C: Mod A (lift or lower assist);2: Supine >  Sit: Max A (lifting assist/Pt. 25-49%)  FIM - Locomotion: Wheelchair Distance: 100 Locomotion: Wheelchair: 2: Travels 50 - 149 ft with moderate assistance (Pt: 50 - 74%) FIM - Locomotion: Ambulation Locomotion: Ambulation Assistive Devices: Other (comment) (R hallway hand rail) Ambulation/Gait Assistance: 1: +2 Total assist (+2 for w/c follow) Locomotion: Ambulation: 1: Two helpers  Comprehension Comprehension Mode: Auditory Comprehension: 4-Understands basic 75 - 89% of the time/requires cueing 10 - 24% of the time  Expression Expression Mode: Verbal Expression: 5-Expresses basic 90% of the time/requires cueing < 10% of the time.  Social Interaction Social Interaction: 5-Interacts appropriately 90% of the time - Needs monitoring or encouragement for participation or interaction.  Problem Solving Problem Solving: 2-Solves basic 25 - 49% of the time - needs direction more than half the time to initiate, plan or complete simple activities  Memory Memory: 3-Recognizes or recalls 50 - 74% of the time/requires cueing 25 - 49% of the time  Medical Problem List and Plan:  left cerebellar infarct with extension and new R-PCA infarct affecting right occipital lobe, mesial and posterior right temporal lobe, mid brain and posterior right thalamus  -currently on ECASA and plavix for secondary stroke prevention  1. DVT Prophylaxis/Anticoagulation: SCD's, sq heparin.  2. Fibromyalgia/Lleft shoulder pain due to subluxation/ Pain Management:nio pain c/os today3. Anxiety disorder/ Mood: Poor awareness with apathy noted. Trial ritalin, titrate dose LCSW to follow for support and evaluation.  4. Neuropsych: This patient is not capable of making decisions on her own behalf.  5. HTN: will monitor with bid checks. Continue catapress, atenolol, norvasc and hydralazine. Generally improved. Sometimes her anxiety affects----bp's are good in the am and peak during the mid day typically---may benefit from  adjustment to timing of medications---increase hydralazine to TID today 6. Borderline diabetes: Hgb A1c-6.3/MFG-134. CM diet restrictions. SSI for elevated BS---most thickened liquids are sweetened.  7: Dyslipidemia: Continue zocor  8. CKD stage 3:  serial checks.  9. Dysphagia: continue D2,nectar liquids with aspiration precautions. IVF at bedtime to maintain adequate hydration. Patient/family non-compliant--continue to educate on aspiration risk.  10. Constipation: Continue miralax and senna.  11. Hyperactive bladder:secondary to CVA   LOS (Days) 9  A FACE TO FACE EVALUATION WAS PERFORMED  KIRSTEINS,ANDREW E 04/26/2013, 11:23 AM

## 2013-04-26 NOTE — Patient Care Conference (Signed)
Inpatient RehabilitationTeam Conference and Plan of Care Update Date: 04/25/2013   Time: 10:30 AM    Patient Name: Kelli [name]      Medical Record Number: [id number]  Date of Birth: [birth year] Sex: Female         Room/Bed: 4W22C/4W22C-01 Payor Info: Payor: Theme park manager / Plan: Theme park manager / Product Type: *No Product type* /    Admitting Diagnosis: CVA  Admit Date/Time:  04/17/2013  5:44 PM Admission Comments: No comment available   Primary Diagnosis:  <principal problem not specified> Principal Problem: <principal problem not specified>  Patient Active Problem List   Diagnosis Date Noted  . Acute CVA (cerebrovascular accident): R PCA infarct per MRI 04/13/13 04/13/2013  . CVA (cerebral infarction) 04/08/2013  . Acute ischemic stroke 04/08/2013  . Diplopia 04/08/2013  . Left-sided weakness 04/08/2013  . Vertigo 04/08/2013  . Paresthesias in left hand 04/08/2013  . Pre-diabetes 04/08/2013  . Cerebellar stroke 04/08/2013  . Dizziness and giddiness 04/08/2013  . Stroke 04/08/2013  . Depression 04/06/2013  . Peripheral arterial disease 03/16/2013  . History of stroke 01/30/2013  . Precordial pain 01/16/2013  . CKD (chronic kidney disease) stage 3, GFR 30-59 ml/min 01/16/2013  . Anxiety state, unspecified 12/08/2012  . Lumbago 12/08/2012  . Urinary frequency 11/14/2012  . Occlusion and stenosis of carotid artery with cerebral infarction 10/05/2012  . Mixed hyperlipidemia 10/05/2012  . Renal insufficiency 10/05/2012  . Hypertension, uncontrolled 10/04/2012  . Tobacco abuse 10/04/2012  . Obesity 10/04/2012    Expected Discharge Date: Expected Discharge Date: 05/12/13  Team Members Present: Physician leading conference: Dr. Alysia Penna Social Worker Present: Alfonse Alpers, LCSW Nurse Present: Other (comment) Henri Medal, RN) PT Present: Georjean Mode, PT;Other (comment) Benjie Karvonen Hobble, PT) OT Present: Gareth Morgan, Lorelee Cover, OT SLP Present:  Germain Osgood, SLP PPS Coordinator present : Daiva Nakayama, RN, CRRN;Becky Alwyn Ren, PT     Current Status/Progress Goal Weekly Team Focus  Medical   inc bladder , cont bowel.  Poor intact  safe po , cont B and B  cont rehab   Bowel/Bladder   continent bowel; incont. of bladder, LBM 2/25  urinary continence  timed toileting and bladder medication if needed   Swallow/Nutrition/ Hydration   Dys 2 textures and nectar thick liquids with ice chips per water protocol  supervision with least restrictive PO  trials of advanced textures, education with family   ADL's   max assist stand pivot transfer toilet; L inattention/neglect; mod assist UB dressing, total assist LB dressing and toilet task  min-mod assist overall  L NMR, postural control in sitting and standing standing, awareness, strengthening, sit<>stand   Mobility   Mod A with tranfers, +2A with gait x15', w/c mobility with min A, stairs not yet attempted  Min A overall  Transfer training, postural stability (sitting, standing), sustained attention, L-sided attention, gait training, initiate stair training   Communication             Safety/Cognition/ Behavioral Observations  Mod-Max for sustained attention, basic problem solving, recall of new information  supervision  recall of information, sustained attention, problem solving, orientation   Pain   left shoulder pain; volteran not effective  manage left shoulder pain with supportive device  maintain sling as needed   Skin   no skin issues  no new breakdown  turn and reposition; assist when needed    Rehab Goals Patient on target to meet rehab goals: Yes Rehab Goals Revised: None *See Care Plan and progress  notes for long and short-term goals.  Barriers to Discharge: heavy assist even on D/C    Possible Resolutions to Barriers:  Consider PEG, Family may hire caregiver vs SNF    Discharge Planning/Teaching Needs:  Pt's husband is willing to hire private caregivers in the  home for pt when she is d/c'd.  He has not ruled out the possibility of pt having a short SNF stay prior to d/c, if needed.  Family will attend family education closer to d/c.   Team Discussion:  Pt is not taking in enough fluids or solids and we need to continue to encourage her to take more in.  ST is concerned because pt is silently aspirating before she swallow and this may not improve until pt's cognition improves.  Pt is making slow, but steady progress with PT and OT.      Revisions to Treatment Plan:  None   Continued Need for Acute Rehabilitation Level of Care: The patient requires daily medical management by a physician with specialized training in physical medicine and rehabilitation for the following conditions: Daily direction of a multidisciplinary physical rehabilitation program to ensure safe treatment while eliciting the highest outcome that is of practical value to the patient.: Yes Daily medical management of patient stability for increased activity during participation in an intensive rehabilitation regime.: Yes Daily analysis of laboratory values and/or radiology reports with any subsequent need for medication adjustment of medical intervention for : Neurological problems;Other  Orton Capell, Silvestre Mesi 04/26/2013, 11:59 AM

## 2013-04-26 NOTE — Progress Notes (Signed)
Occupational Therapy Session Note  Patient Details  Name: Kelli [name] MRN: [id number] Date of Birth: 11-09-1950  Today's Date: 04/26/2013 Time: [id number] and [id number] Time Calculation (min): 56 min and 39 min   Short Term Goals: Week 2:  OT Short Term Goal 1 (Week 2): Pt will sit unsupported EOB/EOM for 30 seconds with min assist while participating in functional task OT Short Term Goal 2 (Week 2): Pt will complete toilet transfer with mod assist  OT Short Term Goal 3 (Week 2): Pt will assist with positioning of LUE during self-care tasks with mod cueing 50% of session  OT Short Term Goal 4 (Week 2): Pt will complete toilet task with max assist   Skilled Therapeutic Interventions/Progress Updates:    Session 1: Pt seen for ADL retraining with focus on attention to task, awareness of left, sit<>stand, and functional transfers. Pt received sitting on toilet with NT present. Pt completed stand pivot transfer to rolling w/c with max assist as pt attempted to sit prior to informing therapist and while unaware if chair was behind her. Provided discussion over safety of this. Completed shower while sitting in rolling shower chair. Pt initiated washing LUE and turned head to L on several occasions without cues. Completed dressing in w/c with assist for positioning of LLE as pt assisted with threading pants, focusing on dynamic sitting balance and anterior weight shift. Completed sit<>stand x4 trials with mod-min assist and cues for hand positioning and postural control. Pt assisting with managing clothing around waist. Pt required total assist for awareness of LUE during sit<>stand. Pt recalled hemi dressing technique for UB for first time however required assist to orient shirt. At end of session pt left sitting in w/c with all needs in reach.   Session 2: Pt seen for 1:1 OT session with focus on NMR and functional transfers. Therapist donned Giv Mohr sling to pt's LUE for assistance with proper  positioning and protections especially during functional transfers and ambulation. Pt excited about sling reporting it was very comfortable and felt like it helped her arm. Completed sit<>stand at sink with sling donned and pt with improved postural control in standing with more symmetrical and slightly retracted. Engaged in PROM and self-ROM exercises 2 sets x 10 reps of 4 exercises. Pt reports she has been practicing some of these in room. Pt requesting to complete toilet task and had continent episode. Pt with max assist for stand pivot transfer. Pt completed hygiene for first time with mod assist for standing balance. Pt wearing Giv Mohr sling throughout task and it seemed to help greatly with positioning and protection during transfer. At end of session pt left sitting in w/c with family present and all needs in reach.   Therapy Documentation Precautions:  Precautions Precautions: Fall Precaution Comments: Lt field deficit and Lt inattention; LUE subluxation  Restrictions Weight Bearing Restrictions: No General:   Vital Signs: Therapy Vitals Pulse Rate: 54 BP: 180/76 mmHg Pain: Pain Assessment Pain Assessment: No/denies pain Pain Score: 0-No pain  See FIM for current functional status  Therapy/Group: Individual Therapy  Duayne Cal 04/26/2013, 12:02 PM

## 2013-04-26 NOTE — Progress Notes (Signed)
Social Work Patient ID: Kelli [name], female   DOB: 1951-01-26, 63 y.o.   MRN: [id number]  CSW met with pt and spoke with husband via phone at the same time on 04-25-13 to update them on team conference discussion.  Pt is still on target to reach minimal assistance goals by 05-12-13 and they were pleased about that.  Pt can tell she is making slow progress and knows it is just going to take some time to recover this time.  Husband was asking about giving pt fruit cups, as he is trying to get her to eat more, too.  CSW shared with him that the concern is the juice in the cup, as well as the size of the pieces of the fruit.  Husband will work with CSW to make arrangements for pt's d/c.

## 2013-04-27 ENCOUNTER — Inpatient Hospital Stay (HOSPITAL_COMMUNITY): Payer: 59

## 2013-04-27 ENCOUNTER — Inpatient Hospital Stay (HOSPITAL_COMMUNITY): Payer: 59 | Admitting: Physical Therapy

## 2013-04-27 LAB — BASIC METABOLIC PANEL
BUN: 18 mg/dL (ref 6–23)
CHLORIDE: 106 meq/L (ref 96–112)
CO2: 22 meq/L (ref 19–32)
Calcium: 8.9 mg/dL (ref 8.4–10.5)
Creatinine, Ser: 1.29 mg/dL — ABNORMAL HIGH (ref 0.50–1.10)
GFR calc Af Amer: 50 mL/min — ABNORMAL LOW (ref >90)
GFR calc non Af Amer: 43 mL/min — ABNORMAL LOW (ref >90)
Glucose, Bld: 104 mg/dL — ABNORMAL HIGH (ref 70–99)
Potassium: 3.8 mEq/L (ref 3.7–5.3)
Sodium: 140 mEq/L (ref 137–147)

## 2013-04-27 LAB — GLUCOSE, CAPILLARY
GLUCOSE-CAPILLARY: 116 mg/dL — AB (ref 70–99)
GLUCOSE-CAPILLARY: 156 mg/dL — AB (ref 70–99)
Glucose-Capillary: 130 mg/dL — ABNORMAL HIGH (ref 70–99)
Glucose-Capillary: 93 mg/dL (ref 70–99)

## 2013-04-27 NOTE — Progress Notes (Signed)
Speech Language Pathology Daily Session Note  Patient Details  Name: Kelli [name] MRN: [id number] Date of Birth: 02-27-1951  Today's Date: 04/27/2013 Time: [id number] Time Calculation (min): 45 min  Short Term Goals: Week 2: SLP Short Term Goal 1 (Week 2): Pt will utilize safe swallowing strategies with current diet with Min cues SLP Short Term Goal 2 (Week 2): Pt attend to her left visual field during basic functional task with Mod cues SLP Short Term Goal 3 (Week 2): Pt will sustain attention to basic functional task for 5 minutes with Mod cues SLP Short Term Goal 4 (Week 2): Pt will utilize environmental cues to demonstrate orientation x4 with Mod cues SLP Short Term Goal 5 (Week 2): Pt will demonstrate adequate problem solving during basic functional task with Mod cues SLP Short Term Goal 6 (Week 2): Pt will demonstrate effective mastication and oral clearance with Dys 3 textures with Min cues  Skilled Therapeutic Interventions: Treatment focused on cognitive and swallowing goals. SLP facilitated session with observation of breakfast meal, consisting of Dys 2 textures and nectar-thick liquids. Pt required Mod-Max cues to sustain attention to self-feeding task for ~30 minutes. She required Min cues to find objects on the left side of her tray. Pt utilized her safe swallowing strategies with supervision level verbal cueing and exhibited wet voice x1, which she reflexively cleared with a throat clear. Pt knew the day of week without cues. Continue plan of care.   FIM:  Comprehension Comprehension Mode: Auditory Comprehension: 5-Understands basic 90% of the time/requires cueing < 10% of the time Expression Expression Mode: Verbal Expression: 5-Expresses basic 90% of the time/requires cueing < 10% of the time. Social Interaction Social Interaction: 3-Interacts appropriately 50 - 74% of the time - May be physically or verbally inappropriate. Problem Solving Problem Solving: 3-Solves basic  50 - 74% of the time/requires cueing 25 - 49% of the time Memory Memory: 3-Recognizes or recalls 50 - 74% of the time/requires cueing 25 - 49% of the time FIM - Eating Eating Activity: 5: Supervision/cues;5: Set-up assist for open containers  Pain Pain Assessment Pain Assessment: No/denies pain  Therapy/Group: Individual Therapy   Germain Osgood, M.A. CCC-SLP [phone]  Germain Osgood 04/27/2013, 12:09 PM

## 2013-04-27 NOTE — Progress Notes (Addendum)
Physical Therapy Session Note  Patient Details  Name: Kelli [name] MRN: [id number] Date of Birth: 01/12/1951  Today's Date: 04/27/2013 Time: 1304-1400 Time Calculation (min): 56 min  Short Term Goals: Week 2:  PT Short Term Goal 1 (Week 2): Pt to consistently perform supine<>sit with min A with HOB flat without rail. PT Short Term Goal 2 (Week 2): Pt to consistently perform bed<>chair transfer with min A. PT Short Term Goal 3 (Week 2): Pt to perform w/c mobility x50' in controlled environment with min A and min cueing for L-sided attention. PT Short Term Goal 4 (Week 2): Pt to perform sit<>stand with mod A using w/c arm rest. PT Short Term Goal 5 (Week 2): Pt to perform gait x15' in controlled encironment with +1Total A.  Skilled Therapeutic Interventions/Progress Updates:    Pt received semi-reclined in bed wearing L GivMohr sling. Performed supine>sit with HOB flat, no rails with mod A to lift trunk. Pt with effective RUE/LE movement to initiate supine>L side lying; however, pt requires mod cueing to initiate bilat LE's toward EOB. While wearing sling on LUE, pt performed static sitting with bilat LE supported, RUE support with SBA x4 minutes. Squat pivot transfer from bed>w/c with mod A, min cueing for hand placement, effective carryover of 50% of setup.  Performed w/c mobility x90' in controlled environment using R hemi technique with mod A for L-sided obstacle negotiation. Pt able to verbalize close proximity to wall on L side; however, unable to self-correct w/c propulsion to avoid obstacles. Tactile cueing at L knee to increase RLE weight bearing; verbal cueing focus on use of R heel to steer w/c.  Donned L AFO (Toe Off brace) to facilitate ankle dorsiflexion during LLE swing phase. Sit>stand with RUE at hallway hand rail, min A, tactile cueing at L knee for increased LLE weight bearing. Gait x12' in controlled environment with RUE support at R hallway hand rail requiring +2A for w/c  follow. Therapist positioned at pt's L side providing manual facilitation at L axilla, inferior to R ribcage for upright posture, tactile cueing at L gluteus maximus for LLE stance stability. Max verbal cueing provided to promote upright posture, head/trunk at midline. Pt able to perform LLE advancement and effective LLE placement without assist. Required cueing to decrease LLE step length.  Squat pivot transfer from w/c<>mat table with mod A. Seated EOM, pt able to self-correct posture to achieve midline using  mirror for visual feedback. Sit<>stand from EOM with min-mod A. Static standing with mirror for visual feedback; pt unable to self-correct posture to achieve midline with mirror, max multimodal cueing. Session ended in pt room, where pt was left seated in w/c with husband present to supervise pt and all needs within reach. Overall, pt with significant improvement in awareness of deficits (limited sustained attention, L-sided neglect, postural impairments) during this session.  Therapy Documentation Precautions:  Precautions Precautions: Fall Precaution Comments: Lt field deficit and Lt inattention; LUE subluxation  Restrictions Weight Bearing Restrictions: No Vital Signs: Therapy Vitals Temp: 97.7 F (36.5 C) Temp src: Oral Pulse Rate: 61 Resp: 18 BP: 146/50 mmHg Patient Position, if appropriate: Lying Oxygen Therapy SpO2: 99 % O2 Device: None (Room air) Pain:  Pt reports no pain during session.  See FIM for current functional status  Therapy/Group: Individual Therapy  Hobble, Malva Cogan 04/27/2013, 7:43 PM

## 2013-04-27 NOTE — Progress Notes (Signed)
Occupational Therapy Session Note  Patient Details  Name: Kelli [name] MRN: [id number] Date of Birth: [birth year]  Today's Date: 04/27/2013 Time: 0730-0830 and 1400-1430 Time Calculation (min): 60 min and 30 min   Short Term Goals: Week 2:  OT Short Term Goal 1 (Week 2): Pt will sit unsupported EOB/EOM for 30 seconds with min assist while participating in functional task OT Short Term Goal 2 (Week 2): Pt will complete toilet transfer with mod assist  OT Short Term Goal 3 (Week 2): Pt will assist with positioning of LUE during self-care tasks with mod cueing 50% of session  OT Short Term Goal 4 (Week 2): Pt will complete toilet task with max assist   Skilled Therapeutic Interventions/Progress Updates:   Session 1: Pt seen for ADL retraining with focus on sitting balance, attention to task, awareness of left, and functional transfers. Pt received supine in bed declining LB bathing stating NT has just assisted with peri hygiene and donning pants. Pt agreeable to UB bathing and dressing sitting EOB. Pt with close supervision for static and dynamic sitting balance with mod assist on 3 occasions d/t LB to left with anterior weight shift to reach towards water basin. Pt with min cues to wash entire LUE with assist for positioning. Pt required increased time for UB dressing with max cues for problems solving and max physical assist. Pt required mod cues throughout session to attend to task being completed. Engaged in weight bearing on L elbow and with LUE extended with slight external rotation while reaching across body to facilitate core strengthening and increased weight bearing. Pt requesting to complete toilet task and had continent episode. Required HOH assist to locate grab bars prior to transfer d/t visual perceptual deficits. Pt requesting to wear Giv Mohr sling during the day. Therapist assisted with donning left pt with all needs in reach.   Session 2: Pt seen for 1:1 OT session with focus on  functional transfers and standing balance. Pt received sitting in w/c. Practiced tub transfer required mod assist for stand pivot transfers and scooting on bench and mod cues for technique. Pt with static sitting balance on TTB at supervision level. Completed transfer out of tub with increased time and mod assist with rest breaks d/t fatigue. Pt requesting to complete toilet task. Pt required mod assist for squat pivot transfer to L side and max assist standing balance with clothing management. Pt with continent episode. Pt then completed stand pivot transfer toilet>w/c to right with max/mod assist. Pt returned to room and was fatigued however asking to stay up in chair for short time to visit with family. Discussed need for grab bars and TTB with husband and he reported having TTB and would install grab bars. Pt left with all needs in reach.   Therapy Documentation Precautions:  Precautions Precautions: Fall Precaution Comments: Lt field deficit and Lt inattention; LUE subluxation  Restrictions Weight Bearing Restrictions: No General:   Vital Signs: Therapy Vitals Temp: 98 F (36.7 C) Temp src: Oral Pulse Rate: 71 Resp: 18 BP: 153/59 mmHg Patient Position, if appropriate: Lying Oxygen Therapy SpO2: 95 % Pain: No report of pain during therapy sessions.   See FIM for current functional status  Therapy/Group: Individual Therapy  Duayne Cal 04/27/2013, 9:24 AM

## 2013-04-27 NOTE — Progress Notes (Signed)
Subjective/Complaints: 63 y.o. 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 She was admitted on 04/08/2013 with dizziness, N/V as well as left-sided numbness and weakness.   More alert and interactive, talking about grandkids ROS:  Cannot obrtain secondary to mental status Objective: Vital Signs: Blood pressure 153/59, pulse 71, temperature 98 F (36.7 C), temperature source Oral, resp. rate 18, weight 88.406 kg (194 lb 14.4 oz), SpO2 95.00%. Ct Angio Chest Aortic Dissect W &/or W/o  04/17/2013   CLINICAL DATA:  Extensive atheromatous plaque in the thoracic aorta. Evaluate for potential penetrating aortic ulcer.  EXAM: CT ANGIOGRAPHY CHEST WITH CONTRAST  TECHNIQUE: Multidetector CT imaging of the chest was performed using the standard protocol during bolus administration of intravenous contrast. Multiplanar CT image reconstructions and MIPs were obtained to evaluate the vascular anatomy.  CONTRAST:  41m OMNIPAQUE IOHEXOL 350 MG/ML SOLN  COMPARISON:  No priors.  FINDINGS: Mediastinum: Precontrast images demonstrate no crescentic high attenuation associated with the wall of the thoracic aorta to suggest acute intramural hemorrhage at this time. Post-contrast images demonstrate extensive atheromatous plaque throughout the distal aortic arch and descending thoracic aorta. Some of these aortic plaques are ulcerated, best demonstrated on image 81 of series 5 in the distal descending thoracic aorta, but there is no frank penetrating aortic ulcer or signs of dissection at this time. Atheromatous plaque in the great vessels of the mediastinum is also noted, including either high-grade stenosis or complete occlusion of the proximal left subclavian artery. Flow on the distal left subclavian artery is noted, either related to incomplete obstruction, or  collateral pathways. Heart size is borderline enlarged. There is no significant pericardial fluid, thickening or pericardial calcification. Calcifications of the mitral annulus. Small hiatal hernia. No pathologically enlarged mediastinal or hilar lymph nodes.  Lungs/Pleura: Linear opacities in the left lower lobe likely reflect chronic scarring. No acute consolidative airspace disease. No pleural effusions. No definite suspicious appearing pulmonary nodules or masses are identified.  Upper Abdomen: Unremarkable.  Musculoskeletal: There are no aggressive appearing lytic or blastic lesions noted in the visualized portions of the skeleton.  Review of the MIP images confirms the above findings.  IMPRESSION: 1. Extensive atheromatous plaque in the thoracic aorta and great vessels of the mediastinum. Although there are several ulcerated atheromatous plaques, there is no evidence of frank penetrating aortic ulcer or thoracic aortic dissection at this time. 2. There is, however, either high-grade stenosis or complete occlusion of the proximal left subclavian artery, with distal flow in the left subclavian artery related to either incomplete occlusion or collateralization. 3. Small hiatal hernia.   Electronically Signed   By: DVinnie LangtonM.D.   On: 04/17/2013 16:32   Results for orders placed during the hospital encounter of 04/17/13 (from the past 72 hour(s))  GLUCOSE, CAPILLARY     Status: Abnormal   Collection Time    04/24/13 11:11 AM      Result Value Ref Range   Glucose-Capillary 112 (*) 70 - 99 mg/dL   Comment 1 Notify RN    GLUCOSE, CAPILLARY     Status: None   Collection Time    04/24/13  5:10 PM      Result Value Ref Range   Glucose-Capillary 99  70 - 99 mg/dL   Comment 1 Notify RN    GLUCOSE, CAPILLARY     Status: None   Collection Time  04/24/13  9:23 PM      Result Value Ref Range   Glucose-Capillary 99  70 - 99 mg/dL  GLUCOSE, CAPILLARY     Status: Abnormal   Collection Time     04/25/13  7:25 AM      Result Value Ref Range   Glucose-Capillary 114 (*) 70 - 99 mg/dL   Comment 1 Notify RN    GLUCOSE, CAPILLARY     Status: Abnormal   Collection Time    04/25/13 11:55 AM      Result Value Ref Range   Glucose-Capillary 116 (*) 70 - 99 mg/dL   Comment 1 Notify RN    GLUCOSE, CAPILLARY     Status: Abnormal   Collection Time    04/25/13  4:44 PM      Result Value Ref Range   Glucose-Capillary 103 (*) 70 - 99 mg/dL   Comment 1 Notify RN    GLUCOSE, CAPILLARY     Status: None   Collection Time    04/25/13  9:31 PM      Result Value Ref Range   Glucose-Capillary 91  70 - 99 mg/dL  GLUCOSE, CAPILLARY     Status: Abnormal   Collection Time    04/26/13  7:08 AM      Result Value Ref Range   Glucose-Capillary 103 (*) 70 - 99 mg/dL   Comment 1 Notify RN    GLUCOSE, CAPILLARY     Status: Abnormal   Collection Time    04/26/13 11:42 AM      Result Value Ref Range   Glucose-Capillary 140 (*) 70 - 99 mg/dL   Comment 1 Notify RN    GLUCOSE, CAPILLARY     Status: Abnormal   Collection Time    04/26/13  4:51 PM      Result Value Ref Range   Glucose-Capillary 147 (*) 70 - 99 mg/dL   Comment 1 Notify RN    GLUCOSE, CAPILLARY     Status: None   Collection Time    04/26/13  9:46 PM      Result Value Ref Range   Glucose-Capillary 99  70 - 99 mg/dL  BASIC METABOLIC PANEL     Status: Abnormal   Collection Time    04/27/13  5:00 AM      Result Value Ref Range   Sodium 140  137 - 147 mEq/L   Potassium 3.8  3.7 - 5.3 mEq/L   Chloride 106  96 - 112 mEq/L   CO2 22  19 - 32 mEq/L   Glucose, Bld 104 (*) 70 - 99 mg/dL   BUN 18  6 - 23 mg/dL   Creatinine, Ser 1.29 (*) 0.50 - 1.10 mg/dL   Calcium 8.9  8.4 - 10.5 mg/dL   GFR calc non Af Amer 43 (*) >90 mL/min   GFR calc Af Amer 50 (*) >90 mL/min   Comment: (NOTE)     The eGFR has been calculated using the CKD EPI equation.     This calculation has not been validated in all clinical situations.     eGFR's persistently <90  mL/min signify possible Chronic Kidney     Disease.  GLUCOSE, CAPILLARY     Status: Abnormal   Collection Time    04/27/13  7:35 AM      Result Value Ref Range   Glucose-Capillary 116 (*) 70 - 99 mg/dL   Comment 1 Notify RN        General:  No acute distress Mood and affect are appropriate Heart: Regular rate and rhythm no rubs murmurs or extra sounds Lungs: Clear to auscultation, breathing unlabored, no rales or wheezes Abdomen: Positive bowel sounds, soft nontender to palpation, nondistended Extremities: No clubbing, cyanosis, or edema Skin: No evidence of breakdown, no evidence of rash Neurologic: Cranial nerves II through XII intact, motor strength is 5/5 in Right deltoid, bicep, tricep, grip, hip flexor, knee extensors, ankle dorsiflexor and plantar flexor 0/5 on left side except trace Left Hip add Sensory exam normal sensation to light touch and proprioception in right upper and lower extremities, feels deep pressure on left side Cerebellar exam cannot assess on left secondary to weakness Musculoskeletal: Pain with Left wrist ROM. Right SCM slightly tight and painful Right gaze preference.   Assessment/Plan: 1. Functional deficits secondary to Right PCA infarct with Left HP, L HH which require 3+ hours per day of interdisciplinary therapy in a comprehensive inpatient rehab setting. Physiatrist is providing close team supervision and 24 hour management of active medical problems listed below. Physiatrist and rehab team continue to assess barriers to discharge/monitor patient progress toward functional and medical goals.  FIM: FIM - Bathing Bathing Steps Patient Completed: Chest;Abdomen;Front perineal area;Left Arm Bathing: 4: Min-Patient completes 8-9 [id number] parts or 75+ percent (declined LB bathing as NT just assist with peri hygiene)  FIM - Upper Body Dressing/Undressing Upper body dressing/undressing steps patient completed: Put head through opening of pull over  shirt/dress Upper body dressing/undressing: 2: Max-Patient completed 25-49% of tasks FIM - Lower Body Dressing/Undressing Lower body dressing/undressing steps patient completed: Thread/unthread right pants leg;Thread/unthread right underwear leg Lower body dressing/undressing: 2: Max-Patient completed 25-49% of tasks  FIM - Toileting Toileting steps completed by patient: Adjust clothing prior to toileting;Adjust clothing after toileting Toileting Assistive Devices: Grab bar or rail for support Toileting: 2: Max-Patient completed 1 of 3 steps  FIM - TRadio producerDevices: Grab bars Toilet Transfers: 2-To toilet/BSC: Max A (lift and lower assist);3-From toilet/BSC: Mod A (lift or lower assist)  FIM - Bed/Chair Transfer Bed/Chair Transfer Assistive Devices: Arm rests;Bed rails Bed/Chair Transfer: 2: Supine > Sit: Max A (lifting assist/Pt. 25-49%);3: Bed > Chair or W/C: Mod A (lift or lower assist)  FIM - Locomotion: Wheelchair Distance: 100 Locomotion: Wheelchair: 2: Travels 50 - 149 ft with moderate assistance (Pt: 50 - 74%) FIM - Locomotion: Ambulation Locomotion: Ambulation Assistive Devices: Other (comment) (R hallway hand rail) Ambulation/Gait Assistance: 1: +2 Total assist (+2 for w/c follow) Locomotion: Ambulation: 1: Two helpers  Comprehension Comprehension Mode: Auditory (Simultaneous filing. User may not have seen previous data.) Comprehension: 4-Understands basic 75 - 89% of the time/requires cueing 10 - 24% of the time (Simultaneous filing. User may not have seen previous data.)  Expression Expression Mode: Verbal (Simultaneous filing. User may not have seen previous data.) Expression: 5-Expresses basic 90% of the time/requires cueing < 10% of the time. (Simultaneous filing. User may not have seen previous data.)  Social Interaction Social Interaction: 5-Interacts appropriately 90% of the time - Needs monitoring or encouragement for  participation or interaction. (Simultaneous filing. User may not have seen previous data.)  Problem Solving Problem Solving: 2-Solves basic 25 - 49% of the time - needs direction more than half the time to initiate, plan or complete simple activities (Simultaneous filing. User may not have seen previous data.)  Memory Memory: 3-Recognizes or recalls 50 - 74% of the time/requires cueing 25 - 49% of the time (Simultaneous filing. User may  not have seen previous data.)  Medical Problem List and Plan:  left cerebellar infarct with extension and new R-PCA infarct affecting right occipital lobe, mesial and posterior right temporal lobe, mid brain and posterior right thalamus  -currently on ECASA and plavix for secondary stroke prevention  1. DVT Prophylaxis/Anticoagulation: SCD's, sq heparin.  2. Fibromyalgia/Lleft shoulder pain due to subluxation/ Pain Management:no pain c/os today                                           3. Anxiety disorder/ Mood: Poor awareness with apathy noted. Trial ritalin, titrate dose LCSW to follow for support and evaluation.  4. Neuropsych: This patient is not capable of making decisions on her own behalf.  5. HTN: will monitor with bid checks. Continue catapress, atenolol, norvasc and hydralazine. Generally improved. Sometimes her anxiety affects----bp's are good in the am and peak during the mid day typically---may benefit from adjustment to timing of medications---increase hydralazine to TID today 6. Borderline diabetes: Hgb A1c-6.3/MFG-134. CM diet restrictions. SSI for elevated BS---most thickened liquids are sweetened.  7: Dyslipidemia: Continue zocor  8. CKD stage 3:  serial checks.  9. Dysphagia: continue D2,nectar liquids with aspiration precautions. IVF at bedtime to maintain adequate hydration. Patient/family non-compliant--continue to educate on aspiration risk.  10. Constipation: Continue miralax and senna.  11. Hyperactive bladder:secondary to CVA   LOS  (Days) 10  A FACE TO FACE EVALUATION WAS PERFORMED  Edythe Riches E 04/27/2013, 9:29 AM

## 2013-04-28 ENCOUNTER — Inpatient Hospital Stay (HOSPITAL_COMMUNITY): Payer: 59

## 2013-04-28 DIAGNOSIS — F411 Generalized anxiety disorder: Secondary | ICD-10-CM

## 2013-04-28 DIAGNOSIS — I6330 Cerebral infarction due to thrombosis of unspecified cerebral artery: Secondary | ICD-10-CM

## 2013-04-28 DIAGNOSIS — N183 Chronic kidney disease, stage 3 unspecified: Secondary | ICD-10-CM

## 2013-04-28 DIAGNOSIS — I69991 Dysphagia following unspecified cerebrovascular disease: Secondary | ICD-10-CM

## 2013-04-28 DIAGNOSIS — I10 Essential (primary) hypertension: Secondary | ICD-10-CM

## 2013-04-28 LAB — GLUCOSE, CAPILLARY
GLUCOSE-CAPILLARY: 92 mg/dL (ref 70–99)
Glucose-Capillary: 117 mg/dL — ABNORMAL HIGH (ref 70–99)
Glucose-Capillary: 150 mg/dL — ABNORMAL HIGH (ref 70–99)
Glucose-Capillary: 124 mg/dL — ABNORMAL HIGH (ref 70–99)

## 2013-04-28 NOTE — Progress Notes (Signed)
Physical Therapy Session Note  Patient Details  Name: Kelli [name] MRN: [id number] Date of Birth: 17-Apr-1950  Today's Date: 04/28/2013 Time: 1120-1205 Time Calculation (min): 45 min    Skilled Therapeutic Interventions/Progress Updates:  Pt received sitting in wheel chair with husband present in room. Pt agreeable to therapy despite reports of fatigue. Pt propelled wheel chair in hallways 130 feet x1 with min A, requiring consistent verbal and tactiles cueing to avoid obstacles on the L side. Pt instructed in scanning her environments, specifically scanning to the L when negotiating the wheel chair. Pt required x2 rest breaks during wheel chair mobility due to fatigue. Pt easily frustrated with task and speaking down about herself and abilities. "I am tired of taking the same old path, ya know." Pt referring to have multiple strokes. PT encouraged pt to continue working hard and understanding the importance of mobility and a positive outlook. Pt completed gait training in hallway with use of R side rail. Pt ambulated 10 feet x2 requiring mod A and +2 for follow with the wheel chair. Pt able to advance the LLE, requiring blocking of the LLE during swing pase of the RLE to prevent knee buckling of the LLE. Pt requires cues to maintain upright posture. Pt completed there ex in sitting, including toe raises and heels raises of the RLE x15, LAQ with AAROM of the LLE and marching with AAROM of the LLE. Pt reported feeling dizzy at end of session. Nurse tech present in room to check blood glucose level: 150. symtoms quickly resolved and pt states "that happens sometimes." Nsg notified. Pt remained in wheel chair for lunch with husband present and quick release belt in place.   Therapy Documentation Precautions:  Precautions Precautions: Fall Precaution Comments: Lt field deficit and Lt inattention; LUE subluxation  Restrictions Weight Bearing Restrictions: No Pain: Pt reported no pain throughout  session.    See FIM for current functional status  Therapy/Group: Individual Therapy  Brandun Pinn R 04/28/2013, 4:02 PM

## 2013-04-28 NOTE — Progress Notes (Signed)
Subjective/Complaints: 63 y.o. 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 She was admitted on 04/08/2013 with dizziness, N/V as well as left-sided numbness and weakness.   She has no specific complaints. He is requesting a grounds past with her family today. Objective: Vital Signs: Blood pressure 184/61, pulse 61, temperature 97.9 F (36.6 C), temperature source Oral, resp. rate 18, weight 194 lb 14.4 oz (88.406 kg), SpO2 98.00%.  No acute distress. Chest clear to auscultation. Cardiac exam S1-S2 are regular. Abdominal exam active bowel sounds, soft. Extremities no edema. Assessment/Plan: 1. Functional deficits secondary to Right PCA infarct with Left HP, L HH  Medical Problem List and Plan:  left cerebellar infarct with extension and new R-PCA infarct affecting right occipital lobe, mesial and posterior right temporal lobe, mid brain and posterior right thalamus  -currently on ECASA and plavix for secondary stroke prevention  1. DVT Prophylaxis/Anticoagulation: SCD's, sq heparin.  2. Fibromyalgia/Lleft shoulder pain due to subluxation/ Pain Management:  3. Anxiety disorder/ Mood: 4. Neuropsych: This patient is not capable of making decisions on her own behalf.  5. HTN: will monitor with bid checks. Blood pressures variable. Will continue to monitor as recent changes to blood pressure medications have been made. 6. Borderline diabetes: Hgb A1c-6.3/MFG-134. CM diet restrictions. SSI for elevated BS---most thickened liquids are sweetened.  CBG (last 3)   Recent Labs  04/27/13 1615 04/27/13 2107 04/28/13 0750  GLUCAP 156* 93 117*   7: Dyslipidemia: Continue zocor  8. CKD stage 3:  serial checks.  9. Dysphagia: continue D2,nectar liquids with aspiration precautions. IVF at bedtime to maintain adequate hydration. Patient/family  non-compliant--continue to educate on aspiration risk.  10. Constipation: Continue miralax and senna.  78. Hyperactive bladder:secondary to CVA   LOS (Days) 11  A FACE TO [place] 04/28/2013, 11:05 AM

## 2013-04-29 LAB — GLUCOSE, CAPILLARY
GLUCOSE-CAPILLARY: 117 mg/dL — AB (ref 70–99)
Glucose-Capillary: 122 mg/dL — ABNORMAL HIGH (ref 70–99)
Glucose-Capillary: 139 mg/dL — ABNORMAL HIGH (ref 70–99)
Glucose-Capillary: 103 mg/dL — ABNORMAL HIGH (ref 70–99)

## 2013-04-29 MED ORDER — HYDRALAZINE HCL 50 MG PO TABS
50.0000 mg | ORAL_TABLET | Freq: Four times a day (QID) | ORAL | Status: DC
  Administered 2013-04-29 – 2013-05-08 (×31): 50 mg via ORAL
  Filled 2013-04-29 (×40): qty 1

## 2013-04-29 NOTE — Plan of Care (Signed)
Problem: RH BOWEL ELIMINATION Goal: RH STG MANAGE BOWEL WITH ASSISTANCE STG Manage Bowel with min Assistance.  Outcome: Not Progressing Pt refusing any stool softeners, laxatives, or any stimulant to help move bowels

## 2013-04-29 NOTE — Plan of Care (Signed)
Problem: RH BOWEL ELIMINATION Goal: RH STG MANAGE BOWEL W/MEDICATION W/ASSISTANCE STG Manage Bowel with Medication with min Assistance.  Outcome: Not Progressing Pt refusing any stool softeners, laxatives, or any stimulant to help move bowels

## 2013-04-29 NOTE — Plan of Care (Signed)
Problem: RH BOWEL ELIMINATION Goal: RH STG MANAGE BOWEL WITH ASSISTANCE STG Manage Bowel with min Assistance.  Outcome: Not Progressing Patient refusing laxatives and stool softeners for BM

## 2013-04-29 NOTE — Progress Notes (Signed)
Subjective/Complaints: 63 y.o. 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 She was admitted on 04/08/2013 with dizziness, N/V as well as left-sided numbness and weakness.   Sleeping Easily awakens. Objective: Vital Signs: Blood pressure 142/80, pulse 68, temperature 97.9 F (36.6 C), temperature source Oral, resp. rate 18, weight 194 lb 14.4 oz (88.406 kg), SpO2 98.00%.  No acute distress. Chest clear to auscultation. Cardiac exam S1-S2 are regular. Abdominal exam active bowel sounds, soft. Extremities no edema. Assessment/Plan: 1. Functional deficits secondary to Right PCA infarct with Left HP, L HH  Medical Problem List and Plan:  left cerebellar infarct with extension and new R-PCA infarct affecting right occipital lobe, mesial and posterior right temporal lobe, mid brain and posterior right thalamus  -currently on ECASA and plavix for secondary stroke prevention  1. DVT Prophylaxis/Anticoagulation: SCD's, sq heparin.  2. Fibromyalgia/Lleft shoulder pain due to subluxation/ Pain Management:  3. Anxiety disorder/ Mood: 4. Neuropsych: This patient is not capable of making decisions on her own behalf.  5. HTN: will monitor with bid checks. Blood pressures variable. Will continue to monitor as recent changes to blood pressure medications have been made. Bp: 142/80-163/50. Will increase hydralazine 6. Borderline diabetes: Hgb A1c-6.3/MFG-134. CM diet restrictions. SSI for elevated BS---most thickened liquids are sweetened.  CBG (last 3)   Recent Labs  04/28/13 1707 04/28/13 2106 04/29/13 0747  GLUCAP 92 124* 117*   7: Dyslipidemia: Continue zocor  8. CKD stage 3:  serial checks.  9. Dysphagia: continue D2,nectar liquids with aspiration precautions. IVF at bedtime to maintain adequate hydration. Patient/family  non-compliant--continue to educate on aspiration risk.  10. Constipation: Continue miralax and senna.  11. Hyperactive bladder:secondary to CVA   LOS (Days) 12  A FACE TO [place] 04/29/2013, 9:48 AM

## 2013-04-30 ENCOUNTER — Ambulatory Visit: Payer: 59

## 2013-04-30 ENCOUNTER — Inpatient Hospital Stay (HOSPITAL_COMMUNITY): Payer: 59

## 2013-04-30 ENCOUNTER — Inpatient Hospital Stay (HOSPITAL_COMMUNITY): Payer: 59 | Admitting: Physical Therapy

## 2013-04-30 LAB — GLUCOSE, CAPILLARY
GLUCOSE-CAPILLARY: 166 mg/dL — AB (ref 70–99)
Glucose-Capillary: 170 mg/dL — ABNORMAL HIGH (ref 70–99)
Glucose-Capillary: 136 mg/dL — ABNORMAL HIGH (ref 70–99)
Glucose-Capillary: 126 mg/dL — ABNORMAL HIGH (ref 70–99)

## 2013-04-30 MED ORDER — SENNOSIDES-DOCUSATE SODIUM 8.6-50 MG PO TABS
2.0000 | ORAL_TABLET | Freq: Two times a day (BID) | ORAL | Status: DC
  Administered 2013-04-30 – 2013-05-07 (×9): 2 via ORAL
  Filled 2013-04-30 (×9): qty 2

## 2013-04-30 NOTE — Progress Notes (Signed)
Physical Therapy Session Note  Patient Details  Name: Kelli [name] MRN: [id number] Date of Birth: December 03, 1950  Today's Date: 04/30/2013 Time: 1400-1453 Time Calculation (min): 53 min  Short Term Goals: Week 2:  PT Short Term Goal 1 (Week 2): Pt to consistently perform supine<>sit with min A with HOB flat without rail. PT Short Term Goal 2 (Week 2): Pt to consistently perform bed<>chair transfer with min A. PT Short Term Goal 3 (Week 2): Pt to perform w/c mobility x50' in controlled environment with min A and min cueing for L-sided attention. PT Short Term Goal 4 (Week 2): Pt to perform sit<>stand with mod A using w/c arm rest. PT Short Term Goal 5 (Week 2): Pt to perform gait x15' in controlled encironment with +1Total A.  Skilled Therapeutic Interventions/Progress Updates:    Co-treatment with rec therapist. Pt received seated in w/c; agreeable to therapy. Performed w/c mobility x75' in controlled environment using R hemi technique with min A, min multimodal cueing for L-sided attention, tactile cueing at L knee to increase LLE weightbearing, increase effectiveness of w/c propulsion. Therapist retrieved alternative (shorter height) w/c to promote independence with w/c mobility.   Donned GivMorh sling at LUE to address L shoulder pain, subluxation. Pt performed sit<>stand from w/c to elevated high-low table to view computer screen. Pt performed static standing with RUE support with min-max A (required increasingly more assist with increased time due to pt fatigue). Pt required manual stabilization of L knee, tactile cueing at L gluteus maximus to increase hip extension, and mod verbal cueing required to address L-sided trunk lean. Standing trial ended secondary to pt request due to fatigue.   During seated rest break, pt reported nausea and began vomiting soon thereafter. RN notified. After episode of vomiting, pt transported to room in w/c. Upon arrival to room, pt with another brief episode of  nausea/vomiting. Performed squat pivot from w/c>bed and sit>supine with +2A secondary to pt feeling ill. Therapist departed with pt semi-reclined in bed (HOB to >60 degrees) with bed alarm on, 3 bed rails up, and all needs within reach. Pt no longer feeling nauseated.   Therapy Documentation Precautions:  Precautions Precautions: Fall Precaution Comments: Lt field deficit and Lt inattention; LUE subluxation  Restrictions Weight Bearing Restrictions: No General: Amount of Missed PT Time (min): 7 Minutes Missed Time Reason: Patient ill (comment);Other (comment) (nausea; vomiting) Vital Signs: Therapy Vitals Temp: 97.3 F (36.3 C) Temp src: Oral Pulse Rate: 55 Resp: 18 BP: 153/59 mmHg Patient Position, if appropriate: Lying Oxygen Therapy SpO2: 99 % O2 Device: None (Room air) Pain: Pain Assessment Pain Assessment: No/denies pain Pain Score: 0-No pain Locomotion : Wheelchair Mobility Distance: 75   See FIM for current functional status  Therapy/Group: Co-Treatment  Hobble, Blair A 04/30/2013, 4:04 PM

## 2013-04-30 NOTE — Progress Notes (Signed)
Subjective/Complaints: 63 y.o. 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 She was admitted on 04/08/2013 with dizziness, N/V as well as left-sided numbness and weakness.   Will I walk again? Left shoulder pain  ROS:  Constipation, bladder working ok, weakness Objective: Vital Signs: Blood pressure 165/60, pulse 51, temperature 98.4 F (36.9 C), temperature source Oral, resp. rate 19, weight 88.406 kg (194 lb 14.4 oz), SpO2 96.00%. Ct Angio Chest Aortic Dissect W &/or W/o  04/17/2013   CLINICAL DATA:  Extensive atheromatous plaque in the thoracic aorta. Evaluate for potential penetrating aortic ulcer.  EXAM: CT ANGIOGRAPHY CHEST WITH CONTRAST  TECHNIQUE: Multidetector CT imaging of the chest was performed using the standard protocol during bolus administration of intravenous contrast. Multiplanar CT image reconstructions and MIPs were obtained to evaluate the vascular anatomy.  CONTRAST:  34mL OMNIPAQUE IOHEXOL 350 MG/ML SOLN  COMPARISON:  No priors.  FINDINGS: Mediastinum: Precontrast images demonstrate no crescentic high attenuation associated with the wall of the thoracic aorta to suggest acute intramural hemorrhage at this time. Post-contrast images demonstrate extensive atheromatous plaque throughout the distal aortic arch and descending thoracic aorta. Some of these aortic plaques are ulcerated, best demonstrated on image 81 of series 5 in the distal descending thoracic aorta, but there is no frank penetrating aortic ulcer or signs of dissection at this time. Atheromatous plaque in the great vessels of the mediastinum is also noted, including either high-grade stenosis or complete occlusion of the proximal left subclavian artery. Flow on the distal left subclavian artery is noted, either related to incomplete obstruction, or collateral  pathways. Heart size is borderline enlarged. There is no significant pericardial fluid, thickening or pericardial calcification. Calcifications of the mitral annulus. Small hiatal hernia. No pathologically enlarged mediastinal or hilar lymph nodes.  Lungs/Pleura: Linear opacities in the left lower lobe likely reflect chronic scarring. No acute consolidative airspace disease. No pleural effusions. No definite suspicious appearing pulmonary nodules or masses are identified.  Upper Abdomen: Unremarkable.  Musculoskeletal: There are no aggressive appearing lytic or blastic lesions noted in the visualized portions of the skeleton.  Review of the MIP images confirms the above findings.  IMPRESSION: 1. Extensive atheromatous plaque in the thoracic aorta and great vessels of the mediastinum. Although there are several ulcerated atheromatous plaques, there is no evidence of frank penetrating aortic ulcer or thoracic aortic dissection at this time. 2. There is, however, either high-grade stenosis or complete occlusion of the proximal left subclavian artery, with distal flow in the left subclavian artery related to either incomplete occlusion or collateralization. 3. Small hiatal hernia.   Electronically Signed   By: Vinnie Langton M.D.   On: 04/17/2013 16:32   Results for orders placed during the hospital encounter of 04/17/13 (from the past 72 hour(s))  GLUCOSE, CAPILLARY     Status: Abnormal   Collection Time    04/27/13  7:35 AM      Result Value Ref Range   Glucose-Capillary 116 (*) 70 - 99 mg/dL   Comment 1 Notify RN    GLUCOSE, CAPILLARY     Status: Abnormal   Collection Time    04/27/13 11:09 AM      Result Value Ref Range   Glucose-Capillary 130 (*) 70 - 99 mg/dL   Comment 1 Notify RN    GLUCOSE, CAPILLARY     Status: Abnormal   Collection Time  04/27/13  4:15 PM      Result Value Ref Range   Glucose-Capillary 156 (*) 70 - 99 mg/dL   Comment 1 Notify RN    GLUCOSE, CAPILLARY     Status: None    Collection Time    04/27/13  9:07 PM      Result Value Ref Range   Glucose-Capillary 93  70 - 99 mg/dL  GLUCOSE, CAPILLARY     Status: Abnormal   Collection Time    04/28/13  7:50 AM      Result Value Ref Range   Glucose-Capillary 117 (*) 70 - 99 mg/dL   Comment 1 Notify RN    GLUCOSE, CAPILLARY     Status: Abnormal   Collection Time    04/28/13 11:52 AM      Result Value Ref Range   Glucose-Capillary 150 (*) 70 - 99 mg/dL   Comment 1 Notify RN    GLUCOSE, CAPILLARY     Status: None   Collection Time    04/28/13  5:07 PM      Result Value Ref Range   Glucose-Capillary 92  70 - 99 mg/dL   Comment 1 Notify RN    GLUCOSE, CAPILLARY     Status: Abnormal   Collection Time    04/28/13  9:06 PM      Result Value Ref Range   Glucose-Capillary 124 (*) 70 - 99 mg/dL   Comment 1 Notify RN    GLUCOSE, CAPILLARY     Status: Abnormal   Collection Time    04/29/13  7:47 AM      Result Value Ref Range   Glucose-Capillary 117 (*) 70 - 99 mg/dL   Comment 1 Notify RN    GLUCOSE, CAPILLARY     Status: Abnormal   Collection Time    04/29/13 11:44 AM      Result Value Ref Range   Glucose-Capillary 122 (*) 70 - 99 mg/dL   Comment 1 Notify RN    GLUCOSE, CAPILLARY     Status: Abnormal   Collection Time    04/29/13  4:50 PM      Result Value Ref Range   Glucose-Capillary 139 (*) 70 - 99 mg/dL   Comment 1 Notify RN    GLUCOSE, CAPILLARY     Status: Abnormal   Collection Time    04/29/13  9:16 PM      Result Value Ref Range   Glucose-Capillary 103 (*) 70 - 99 mg/dL   Comment 1 Notify RN    GLUCOSE, CAPILLARY     Status: Abnormal   Collection Time    04/30/13  7:12 AM      Result Value Ref Range   Glucose-Capillary 170 (*) 70 - 99 mg/dL   Comment 1 Notify RN        General: No acute distress Mood and affect are appropriate Heart: Regular rate and rhythm no rubs murmurs or extra sounds Lungs: Clear to auscultation, breathing unlabored, no rales or wheezes Abdomen: Positive  bowel sounds, soft nontender to palpation, nondistended Extremities: No clubbing, cyanosis, or edema Skin: No evidence of breakdown, no evidence of rash Neurologic: Cranial nerves II through XII intact, motor strength is 5/5 in Right deltoid, bicep, tricep, grip, hip flexor, knee extensors, ankle dorsiflexor and plantar flexor 0/5 on left side except trace Left Hip add Sensory exam normal sensation to light touch and proprioception in right upper and lower extremities, feels deep pressure on left side Cerebellar exam cannot  assess on left secondary to weakness Musculoskeletal: Pain with Left wrist ROM. Right SCM slightly tight and painful Right gaze preference.   Assessment/Plan: 1. Functional deficits secondary to Right PCA infarct with Left HP, L HH which require 3+ hours per day of interdisciplinary therapy in a comprehensive inpatient rehab setting. Physiatrist is providing close team supervision and 24 hour management of active medical problems listed below. Physiatrist and rehab team continue to assess barriers to discharge/monitor patient progress toward functional and medical goals.  FIM: FIM - Bathing Bathing Steps Patient Completed: Chest;Abdomen;Front perineal area;Left Arm Bathing: 4: Min-Patient completes 8-9 65f 10 parts or 75+ percent (declined LB bathing as NT just assist with peri hygiene)  FIM - Upper Body Dressing/Undressing Upper body dressing/undressing steps patient completed: Put head through opening of pull over shirt/dress Upper body dressing/undressing: 2: Max-Patient completed 25-49% of tasks FIM - Lower Body Dressing/Undressing Lower body dressing/undressing steps patient completed: Thread/unthread right pants leg;Thread/unthread right underwear leg Lower body dressing/undressing: 2: Max-Patient completed 25-49% of tasks  FIM - Toileting Toileting steps completed by patient: Performs perineal hygiene Toileting Assistive Devices: Grab bar or rail for  support Toileting: 1: Two helpers  FIM - Radio producer Devices: Product manager Transfers: 1-Two helpers  FIM - Control and instrumentation engineer Devices: Arm rests Bed/Chair Transfer: 1: Two helpers  FIM - Locomotion: Wheelchair Distance: 130 Locomotion: Wheelchair: 2: Travels 49 - 149 ft with minimal assistance (Pt.>75%) FIM - Locomotion: Ambulation Locomotion: Ambulation Assistive Devices:  (R arm rail) Ambulation/Gait Assistance: 1: +2 Total assist (mod A and + 2 assist for follow with the wheel chair) Locomotion: Ambulation: 1: Two helpers  Comprehension Comprehension Mode: Auditory Comprehension: 5-Understands complex 90% of the time/Cues < 10% of the time  Expression Expression Mode: Verbal Expression: 5-Expresses basic 90% of the time/requires cueing < 10% of the time.  Social Interaction Social Interaction: 5-Interacts appropriately 90% of the time - Needs monitoring or encouragement for participation or interaction.  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 DVT Prophylaxis/Anticoagulation: SCD's, sq heparin.  2. Fibromyalgia/Lleft shoulder pain due to subluxation/ Pain Management:no pain c/os today                                           3. Anxiety disorder/ Mood: Poor awareness with apathy noted. Trial ritalin, titrate dose LCSW to follow for support and evaluation.  4. Neuropsych: This patient is not capable of making decisions on her own behalf.  5. HTN: will monitor with bid checks.  Continue catapress, atenolol, norvasc and hydralazine. Generally improved. Sometimes her anxiety affects----bp's are good in the am and peak during the mid day typically---may benefit from adjustment to timing of medications---increase hydralazine to TID today 6. Borderline diabetes: Hgb A1c-6.3/MFG-134. CM diet restrictions. SSI for elevated BS---most thickened liquids are sweetened.  7: Dyslipidemia: Continue zocor  8. CKD stage 3:  serial checks.  9. Dysphagia: continue D2,nectar liquids with aspiration  precautions. IVF at bedtime to maintain adequate hydration. Patient/family non-compliant--continue to educate on aspiration risk.  10. Constipation: Continue miralax and senna.  27. Hyperactive bladder:secondary to CVA 12.  Left shoulder pain- subluxation, may also have adhesive capsulitis trial FES  LOS (Days) 13  A FACE TO FACE EVALUATION WAS PERFORMED  Voncile Schwarz E 04/30/2013, 7:32 AM

## 2013-04-30 NOTE — Progress Notes (Signed)
Social Work Patient ID: Kelli [name], female   DOB: 20-Mar-1950, 63 y.o.   MRN: [id number]  CSW has followed up with pt's physician's, per her request, to inform them of her hospitalization.  CSW continues to follow pt for support and will assist with d/c plan once we are closer to d/c.

## 2013-04-30 NOTE — Progress Notes (Signed)
Occupational Therapy Session Note  Patient Details  Name: Kelli [name] MRN: [id number] Date of Birth: 1950-08-27  Today's Date: 04/30/2013 Time: [id number] and 1132-1200 Time Calculation (min): 55 min and 28 min   Short Term Goals: Week 2:  OT Short Term Goal 1 (Week 2): Pt will sit unsupported EOB/EOM for 30 seconds with min assist while participating in functional task OT Short Term Goal 2 (Week 2): Pt will complete toilet transfer with mod assist  OT Short Term Goal 3 (Week 2): Pt will assist with positioning of LUE during self-care tasks with mod cueing 50% of session  OT Short Term Goal 4 (Week 2): Pt will complete toilet task with max assist   Skilled Therapeutic Interventions/Progress Updates:    Session 1: Pt seen for ADL retraining with focus on sitting balance, sit<>stand, attention to L, and problem solving. Pt received supine in bed discussing discharge at this time. Provided education on stroke recovery and pt aware she will have therapy following discharge. Pt reporting husband going to look at SNF today and is more open to that option at discharge however would much rather go home and wants to continue to work towards that goal. Discussed need for physical assistance following discharge and husband and pt aware of this. Will continue with discharge plans for home at this time.  Completed bathing sitting EOB with supervision for sitting balance and min cues for attention to L to locate basin of water and towel. Pt required mod cues to attend to task throughout session. Cued to visually attend to LUE while washing and dressing it as she was initiating task however demonstrating right gaze. Focused on anterior weight shift when washing lower BLEs and crossover technique for LB dressing. Pt successful with assist for positioning of LLE. Pt required max cues following planned failure for problem solving for UB dressing as pt with increased time threading RUE as she was continuously twisting  shirt and entering through head opening. Completed sit<>stand with mod-min assist x4 trials with cues for ensure therapist ready when pt going to sit. Provided GivMohr sling and pt reported increased comfort to LUE. At end of session pt left with arm trough on and all needs in reach.    Session 2: Pt seen for 1:1 OT session with focus on NMR to RUE. Utilized NMES to facilitate muscle activation and support to subluxed shoulder. Electrode placement on posterior deltoid and supraspinatus. Completed for 15 min with focus on addressing L inattention while in sitting to locate items around room. Pt with trace movement in shoulder following NMES and very motivated by this. Pt with improved awareness of positioning of RUE throughout session. Pt left sitting in w/c with all needs in reach.   Therapy Documentation Precautions:  Precautions Precautions: Fall Precaution Comments: Lt field deficit and Lt inattention; LUE subluxation  Restrictions Weight Bearing Restrictions: No General: General Amount of Missed OT Time (min): 5 Minutes Vital Signs: Therapy Vitals Temp: 98.4 F (36.9 C) Temp src: Oral Pulse Rate: 51 Resp: 19 BP: 165/60 mmHg (manual retake ) Patient Position, if appropriate: Lying Oxygen Therapy SpO2: 96 % O2 Device: None (Room air) Pain: Pt reporting pain in LUE. Reports GivMohr sling assists with pain management.   Other Treatments:    See FIM for current functional status  Therapy/Group: Individual Therapy  Duayne Cal 04/30/2013, 9:12 AM

## 2013-04-30 NOTE — Progress Notes (Signed)
Recreational Therapy Session Note  Patient Details  Name: KATHALIA GILKES MRN: [id number] Date of Birth: 05/09/1950 Today's Date: 04/30/2013  Pain: no c/o, but c/o N/V Skilled Therapeutic Interventions/Progress Updates: Session focused on activity tolerance, static standing balance, w/c mobility, selective attention, attending to the left during tabletop computer task with mod cues for scanning/attention.  Pt required mod assist for w/c mobility and max cues to attend to the left. Pt with c/o N/V with one episode of vomiting during session, RN made aware.    Therapy/Group: Co-Treatment   Rei Medlen 04/30/2013, 3:42 PM

## 2013-04-30 NOTE — Progress Notes (Signed)
Speech Language Pathology Daily Session Note  Patient Details  Name: Kelli [name] MRN: [id number] Date of Birth: Oct 05, 1950  Today's Date: 04/30/2013 Time: [id number] Time Calculation (min): 42 min  Short Term Goals: Week 2: SLP Short Term Goal 1 (Week 2): Pt will utilize safe swallowing strategies with current diet with Min cues SLP Short Term Goal 2 (Week 2): Pt attend to her left visual field during basic functional task with Mod cues SLP Short Term Goal 3 (Week 2): Pt will sustain attention to basic functional task for 5 minutes with Mod cues SLP Short Term Goal 4 (Week 2): Pt will utilize environmental cues to demonstrate orientation x4 with Mod cues SLP Short Term Goal 5 (Week 2): Pt will demonstrate adequate problem solving during basic functional task with Mod cues SLP Short Term Goal 6 (Week 2): Pt will demonstrate effective mastication and oral clearance with Dys 3 textures with Min cues  Skilled Therapeutic Interventions: Treatment focused on cognitive and swallowing goals. SLP facilitated session with observation of breakfast meal, consisting of Dys 2 textures and nectar-thick liquids. Tray included fruit platter, which SLP cut into smaller pieces and which technically contained thin liquids within fruit. Pt required Mod cues to sustain attention to self-feeding task for ~35 minutes. She required Mod cues to find objects on the left side of her tray. Pt utilized her safe swallowing strategies with supervision level verbal cueing and exhibited reflexive cough x1. As pt's sustained attention continues to improve, she will be able to attempt repeat MBS to assess for upgrade to thin liquids. Will plan to trial water this week. Continue plan of care.   FIM:  Comprehension Comprehension Mode: Auditory Comprehension: 5-Understands basic 90% of the time/requires cueing < 10% of the time Expression Expression Mode: Verbal Expression: 5-Expresses basic 90% of the time/requires cueing < 10%  of the time. Social Interaction Social Interaction: 4-Interacts appropriately 75 - 89% of the time - Needs redirection for appropriate language or to initiate interaction. Problem Solving Problem Solving: 4-Solves basic 75 - 89% of the time/requires cueing 10 - 24% of the time Memory Memory: 4-Recognizes or recalls 75 - 89% of the time/requires cueing 10 - 24% of the time FIM - Eating Eating Activity: 5: Set-up assist for open containers;5: Set-up assist for cut food;5: Supervision/cues  Pain Pain Assessment Pain Assessment: No/denies pain  Therapy/Group: Individual Therapy   Germain Osgood, M.A. CCC-SLP [phone]  Germain Osgood 04/30/2013, 11:33 AM

## 2013-05-01 ENCOUNTER — Inpatient Hospital Stay (HOSPITAL_COMMUNITY): Payer: 59 | Admitting: Physical Therapy

## 2013-05-01 ENCOUNTER — Inpatient Hospital Stay (HOSPITAL_COMMUNITY): Payer: 59 | Admitting: Speech Pathology

## 2013-05-01 ENCOUNTER — Ambulatory Visit: Payer: 59 | Admitting: Vascular Surgery

## 2013-05-01 ENCOUNTER — Inpatient Hospital Stay (HOSPITAL_COMMUNITY): Payer: 59

## 2013-05-01 ENCOUNTER — Encounter: Payer: Self-pay | Admitting: Internal Medicine

## 2013-05-01 ENCOUNTER — Other Ambulatory Visit (HOSPITAL_COMMUNITY): Payer: 59

## 2013-05-01 DIAGNOSIS — N183 Chronic kidney disease, stage 3 unspecified: Secondary | ICD-10-CM

## 2013-05-01 DIAGNOSIS — I6330 Cerebral infarction due to thrombosis of unspecified cerebral artery: Secondary | ICD-10-CM

## 2013-05-01 DIAGNOSIS — I69991 Dysphagia following unspecified cerebrovascular disease: Secondary | ICD-10-CM

## 2013-05-01 DIAGNOSIS — I10 Essential (primary) hypertension: Secondary | ICD-10-CM

## 2013-05-01 DIAGNOSIS — F411 Generalized anxiety disorder: Secondary | ICD-10-CM

## 2013-05-01 LAB — COMPREHENSIVE METABOLIC PANEL
ALBUMIN: 3.4 g/dL — AB (ref 3.5–5.2)
ALK PHOS: 77 U/L (ref 39–117)
ALT: 18 U/L (ref 0–35)
AST: 16 U/L (ref 0–37)
BUN: 19 mg/dL (ref 6–23)
CO2: 21 mEq/L (ref 19–32)
CREATININE: 1.44 mg/dL — AB (ref 0.50–1.10)
Calcium: 9.4 mg/dL (ref 8.4–10.5)
Chloride: 104 mEq/L (ref 96–112)
GFR calc Af Amer: 44 mL/min — ABNORMAL LOW (ref >90)
GFR calc non Af Amer: 38 mL/min — ABNORMAL LOW (ref >90)
Glucose, Bld: 155 mg/dL — ABNORMAL HIGH (ref 70–99)
POTASSIUM: 4.4 meq/L (ref 3.7–5.3)
Sodium: 140 mEq/L (ref 137–147)
TOTAL PROTEIN: 7 g/dL (ref 6.0–8.3)
Total Bilirubin: 0.3 mg/dL (ref 0.3–1.2)

## 2013-05-01 LAB — GLUCOSE, CAPILLARY
GLUCOSE-CAPILLARY: 133 mg/dL — AB (ref 70–99)
GLUCOSE-CAPILLARY: 148 mg/dL — AB (ref 70–99)
GLUCOSE-CAPILLARY: 163 mg/dL — AB (ref 70–99)
Glucose-Capillary: 107 mg/dL — ABNORMAL HIGH (ref 70–99)

## 2013-05-01 NOTE — Progress Notes (Signed)
Physical Therapy Session Note  Patient Details  Name: Kelli [name] MRN: [id number] Date of Birth: 03-08-1950  Today's Date: 05/01/2013 Time:  0917-1000 43 min  Short Term Goals: Week 2:  PT Short Term Goal 1 (Week 2): Pt to consistently perform supine<>sit with min A with HOB flat without rail. PT Short Term Goal 2 (Week 2): Pt to consistently perform bed<>chair transfer with min A. PT Short Term Goal 3 (Week 2): Pt to perform w/c mobility x50' in controlled environment with min A and min cueing for L-sided attention. PT Short Term Goal 4 (Week 2): Pt to perform sit<>stand with mod A using w/c arm rest. PT Short Term Goal 5 (Week 2): Pt to perform gait x15' in controlled encironment with +1Total A.  Skilled Therapeutic Interventions/Progress Updates:    Co-treatment with rec therapist. Pt received seated in w/c having just finished breakfast. Pt very distracted, anxious. PT educated pt on significance of inhalation through nose, exhalation through mouth, paced breathing with exercise. Therapist explained diaphragmatic breathing technique, importance of breathing properly during physical activity. Therapist demonstrated technique and reinforced with tactile cueing via pt hand at pt's sternum, therapists hand at pt's abdomen. With multimodal cueing, pt able to give effective return demonstration of diaphragmatic breathing technique.   Attempted w/c mobility using w/c with seat height lower than standard to facilitate effectiveness of RLE w/c propulsion. Performed w/c mobility x15' in controlled environment using R hemi technique. Despite lower seat height and pt ability to easily touch floor with R heel, pt continues to require min A, tactile cueing at R knee for RLE weightbearing due to decreased carryover of technique, decreased sustained attention. Pt transported remaining distance to gym in w/c. Once in gym, pt performed scoot pivot transfer from w/c<>mat table with mod A. Pt with effective  between-session carryover of >80% of transfer setup.   After transfers x2, pt reports onset of nausea. Therapist transported pt to room, where pt performed squat pivot from w/c>bed (Bobath technique to facilitate anterior weight shift) with mod A. Seated EOM, pt performed scooting to L side with min A, manual facilitation of anterior weight shift. Pt required mulitmodal cueing to attend to position of LLE during transfer, scooting. Performed sit>supine with HOB flat without rail with min A for LLE management. Once supine in bed, pt required +2A for scooting to [facility] secondary to nausea, fatigue. Therapist departed with pt semi-reclined in bed with 3 bed rails up, bed alarm on, all needs within reach, and rec therapist present to continue rec therapy session.  Therapy Documentation Precautions:  Precautions Precautions: Fall Precaution Comments: Lt field deficit and Lt inattention; LUE subluxation  Restrictions Weight Bearing Restrictions: No General: Amount of Missed PT Time (min): 17 Minutes. Missed Time Reason: Other (comment) Breakfast tray arrived late; pt requesting to eat prior to PT session. Vital Signs: Therapy Vitals Temp: 98.2 F (36.8 C) Temp src: Oral Pulse Rate: 53 Resp: 18 BP: 160/42 mmHg Patient Position, if appropriate: Lying Oxygen Therapy SpO2: 97 % O2 Device: None (Room air) Pain:  Pt reports no pain during session.  See FIM for current functional status  Therapy/Group: Co-Treatment  Hobble, Blair A 05/01/2013, 5:11 PM

## 2013-05-01 NOTE — Progress Notes (Signed)
Occupational Therapy Session Note  Patient Details  Name: Kelli [name] MRN: [id number] Date of Birth: 1950/10/30  Today's Date: 05/01/2013 Time: 0730-0830 Time Calculation (min): 60 min  Short Term Goals: Week 2:  OT Short Term Goal 1 (Week 2): Pt will sit unsupported EOB/EOM for 30 seconds with min assist while participating in functional task OT Short Term Goal 2 (Week 2): Pt will complete toilet transfer with mod assist  OT Short Term Goal 3 (Week 2): Pt will assist with positioning of LUE during self-care tasks with mod cueing 50% of session  OT Short Term Goal 4 (Week 2): Pt will complete toilet task with max assist   Skilled Therapeutic Interventions/Progress Updates:    Pt seen for ADL retraining with focus on dynamic sitting balance, attention to L, body awareness, and functional transfers. Pt received supine in bed and agreeable to shower this AM. Pt completed squat pivot transfer to right w/c>TTB with mod assist. Pt with close supervision for sitting balance throughout bathing and mod cues for awareness of positioning of LUE and attention to L. Completed sit<>stand in shower with min assist relying greatly on grab bar. Completed dressing with max cues for sustained attention to task and max cues for orienting shirt. Pt recalled hemi dressing techniques. Therapist assisted with positioning of LLE for donning pants and pt required max cues for donning into correct pant leg as she was unaware when she did it incorrectly. Pt assisted with managing clothing around waist, requiring mod-max assist for standing balance. At end of session pt left sitting in w/c with all needs in and MD present.   Therapy Documentation Precautions:  Precautions Precautions: Fall Precaution Comments: Lt field deficit and Lt inattention; LUE subluxation  Restrictions Weight Bearing Restrictions: No General:   Vital Signs: Therapy Vitals Temp: 97.2 F (36.2 C) Pulse Rate: 53 Resp: 18 BP: 146/72  mmHg Oxygen Therapy SpO2: 97 % Pain: Pt reporting much less pain in L shoulder throughout night and this AM after receiving NMES.  See FIM for current functional status  Therapy/Group: Individual Therapy  Duayne Cal 05/01/2013, 9:25 AM

## 2013-05-01 NOTE — Progress Notes (Signed)
Speech Language Pathology Daily Session Note  Patient Details  Name: Kelli [name] MRN: [id number] Date of Birth: 19-Mar-1950  Today's Date: 05/01/2013 Time: [id number] Time Calculation (min): 45 min  Short Term Goals: Week 2: SLP Short Term Goal 1 (Week 2): Pt will utilize safe swallowing strategies with current diet with Min cues SLP Short Term Goal 2 (Week 2): Pt attend to her left visual field during basic functional task with Mod cues SLP Short Term Goal 2 - Progress (Week 2): Progressing toward goal SLP Short Term Goal 3 (Week 2): Pt will sustain attention to basic functional task for 5 minutes with Mod cues SLP Short Term Goal 3 - Progress (Week 2): Progressing toward goal SLP Short Term Goal 4 (Week 2): Pt will utilize environmental cues to demonstrate orientation x4 with Mod cues SLP Short Term Goal 4 - Progress (Week 2): Progressing toward goal SLP Short Term Goal 5 (Week 2): Pt will demonstrate adequate problem solving during basic functional task with Mod cues SLP Short Term Goal 5 - Progress (Week 2): Progressing toward goal SLP Short Term Goal 6 (Week 2): Pt will demonstrate effective mastication and oral clearance with Dys 3 textures with Min cues  Skilled Therapeutic Interventions: Treatment focused on cognitive goals: Pt required mod verbal/tactile cues to identify and compare images on card, encouraged to self-cue using right hand to find left edge of material.  Mod cues to search environment on left when information she sought was not immediately visible.  Mod verbal cues for verbal problem solving.  Pt  with increased nausea end of session culminating in vomiting; provided assistance and RN called, session ended,.    FIM:  Comprehension Comprehension Mode: Auditory Comprehension: 5-Understands basic 90% of the time/requires cueing < 10% of the time Expression Expression Mode: Verbal Expression: 5-Expresses basic 90% of the time/requires cueing < 10% of the time. Social  Interaction Social Interaction: 4-Interacts appropriately 75 - 89% of the time - Needs redirection for appropriate language or to initiate interaction. Problem Solving Problem Solving: 4-Solves basic 75 - 89% of the time/requires cueing 10 - 24% of the time Memory Memory: 4-Recognizes or recalls 75 - 89% of the time/requires cueing 10 - 24% of the time FIM - Eating Eating Activity: 5: Set-up assist for open containers;5: Supervision/cues  Pain Pain Assessment Pain Assessment: No/denies pain  Therapy/Group: Individual Therapy  Juan Quam Laurice 05/01/2013, 12:27 PM

## 2013-05-01 NOTE — Progress Notes (Signed)
Recreational Therapy Session Note  Patient Details  Name: Kelli [name] MRN: [id number] Date of Birth: September 16, 1950 Today's Date: 05/01/2013  Pain: no c/o Skilled Therapeutic Interventions/Progress Updates: Pt with c/o fatigue during TR/PT co-treat.  Pt returned to room per her request and assisted to bed by PT.  Remainder of session focused on diaphragmatic breathing with incorporation of relaxing music of pt's choice to assist with decreasing anxiety.  Therapy/Group: Individual Therapy  Naeema Patlan 05/01/2013, 4:32 PM

## 2013-05-01 NOTE — Progress Notes (Signed)
Physical Therapy Note  Patient Details  Name: Kelli [name] MRN: [id number] Date of Birth: [birth year] Today's Date: 05/01/2013  Patient missing 30 minutes of skilled physical therapy secondary to pt declining therapy due to nausea. RN notified. Per RN, pt vomited at 1130; nausea medication administered at that time.   Stefano Gaul 05/01/2013, 1:23 PM

## 2013-05-01 NOTE — Progress Notes (Addendum)
Subjective/Complaints: 63 y.o. 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 She was admitted on 04/08/2013 with dizziness, N/V as well as left-sided numbness and weakness.   Nauseated this am no vomiting Int SOB no cough or CP FES x 10min yest helped shoulder ROS:  Constipation, bladder working ok, weakness Objective: Vital Signs: Blood pressure 146/72, pulse 53, temperature 97.2 F (36.2 C), temperature source Oral, resp. rate 18, weight 88.406 kg (194 lb 14.4 oz), SpO2 97.00%. Ct Angio Chest Aortic Dissect W &/or W/o  04/17/2013   CLINICAL DATA:  Extensive atheromatous plaque in the thoracic aorta. Evaluate for potential penetrating aortic ulcer.  EXAM: CT ANGIOGRAPHY CHEST WITH CONTRAST  TECHNIQUE: Multidetector CT imaging of the chest was performed using the standard protocol during bolus administration of intravenous contrast. Multiplanar CT image reconstructions and MIPs were obtained to evaluate the vascular anatomy.  CONTRAST:  77mL OMNIPAQUE IOHEXOL 350 MG/ML SOLN  COMPARISON:  No priors.  FINDINGS: Mediastinum: Precontrast images demonstrate no crescentic high attenuation associated with the wall of the thoracic aorta to suggest acute intramural hemorrhage at this time. Post-contrast images demonstrate extensive atheromatous plaque throughout the distal aortic arch and descending thoracic aorta. Some of these aortic plaques are ulcerated, best demonstrated on image 81 of series 5 in the distal descending thoracic aorta, but there is no frank penetrating aortic ulcer or signs of dissection at this time. Atheromatous plaque in the great vessels of the mediastinum is also noted, including either high-grade stenosis or complete occlusion of the proximal left subclavian artery. Flow on the distal left subclavian artery is noted, either  related to incomplete obstruction, or collateral pathways. Heart size is borderline enlarged. There is no significant pericardial fluid, thickening or pericardial calcification. Calcifications of the mitral annulus. Small hiatal hernia. No pathologically enlarged mediastinal or hilar lymph nodes.  Lungs/Pleura: Linear opacities in the left lower lobe likely reflect chronic scarring. No acute consolidative airspace disease. No pleural effusions. No definite suspicious appearing pulmonary nodules or masses are identified.  Upper Abdomen: Unremarkable.  Musculoskeletal: There are no aggressive appearing lytic or blastic lesions noted in the visualized portions of the skeleton.  Review of the MIP images confirms the above findings.  IMPRESSION: 1. Extensive atheromatous plaque in the thoracic aorta and great vessels of the mediastinum. Although there are several ulcerated atheromatous plaques, there is no evidence of frank penetrating aortic ulcer or thoracic aortic dissection at this time. 2. There is, however, either high-grade stenosis or complete occlusion of the proximal left subclavian artery, with distal flow in the left subclavian artery related to either incomplete occlusion or collateralization. 3. Small hiatal hernia.   Electronically Signed   By: Vinnie Langton M.D.   On: 04/17/2013 16:32   Results for orders placed during the hospital encounter of 04/17/13 (from the past 72 hour(s))  GLUCOSE, CAPILLARY     Status: Abnormal   Collection Time    04/28/13 11:52 AM      Result Value Ref Range   Glucose-Capillary 150 (*) 70 - 99 mg/dL   Comment 1 Notify RN    GLUCOSE, CAPILLARY     Status: None   Collection Time    04/28/13  5:07 PM      Result Value Ref Range   Glucose-Capillary 92  70 - 99 mg/dL   Comment 1 Notify RN    GLUCOSE, CAPILLARY  Status: Abnormal   Collection Time    04/28/13  9:06 PM      Result Value Ref Range   Glucose-Capillary 124 (*) 70 - 99 mg/dL   Comment 1 Notify RN     GLUCOSE, CAPILLARY     Status: Abnormal   Collection Time    04/29/13  7:47 AM      Result Value Ref Range   Glucose-Capillary 117 (*) 70 - 99 mg/dL   Comment 1 Notify RN    GLUCOSE, CAPILLARY     Status: Abnormal   Collection Time    04/29/13 11:44 AM      Result Value Ref Range   Glucose-Capillary 122 (*) 70 - 99 mg/dL   Comment 1 Notify RN    GLUCOSE, CAPILLARY     Status: Abnormal   Collection Time    04/29/13  4:50 PM      Result Value Ref Range   Glucose-Capillary 139 (*) 70 - 99 mg/dL   Comment 1 Notify RN    GLUCOSE, CAPILLARY     Status: Abnormal   Collection Time    04/29/13  9:16 PM      Result Value Ref Range   Glucose-Capillary 103 (*) 70 - 99 mg/dL   Comment 1 Notify RN    GLUCOSE, CAPILLARY     Status: Abnormal   Collection Time    04/30/13  7:12 AM      Result Value Ref Range   Glucose-Capillary 170 (*) 70 - 99 mg/dL   Comment 1 Notify RN    GLUCOSE, CAPILLARY     Status: Abnormal   Collection Time    04/30/13 11:04 AM      Result Value Ref Range   Glucose-Capillary 136 (*) 70 - 99 mg/dL   Comment 1 Notify RN    GLUCOSE, CAPILLARY     Status: Abnormal   Collection Time    04/30/13  4:17 PM      Result Value Ref Range   Glucose-Capillary 126 (*) 70 - 99 mg/dL  GLUCOSE, CAPILLARY     Status: Abnormal   Collection Time    04/30/13  8:38 PM      Result Value Ref Range   Glucose-Capillary 166 (*) 70 - 99 mg/dL  GLUCOSE, CAPILLARY     Status: Abnormal   Collection Time    05/01/13  7:18 AM      Result Value Ref Range   Glucose-Capillary 133 (*) 70 - 99 mg/dL   Comment 1 Notify RN        General: No acute distress Mood and affect are appropriate Heart: Regular rate and rhythm no rubs murmurs or extra sounds Lungs: Clear to auscultation, breathing unlabored, no rales or wheezes Abdomen: Positive bowel sounds, soft nontender to palpation, nondistended Extremities: No clubbing, cyanosis, or edema Skin: No evidence of breakdown, no evidence of  rash Neurologic: Cranial nerves II through XII intact, motor strength is 5/5 in Right deltoid, bicep, tricep, grip, hip flexor, knee extensors, ankle dorsiflexor and plantar flexor 0/5 on left side except trace Left Hip add,2- KE Sensory exam normal sensation to light touch and proprioception in right upper and lower extremities, feels deep pressure on left side Cerebellar exam cannot assess on left secondary to weakness Musculoskeletal: Pain with Left shouldr ROM, + Sublux Right gaze preference.   Assessment/Plan: 1. Functional deficits secondary to Right PCA infarct with Left HP, L HH which require 3+ hours per day of interdisciplinary therapy in a  comprehensive inpatient rehab setting. Physiatrist is providing close team supervision and 24 hour management of active medical problems listed below. Physiatrist and rehab team continue to assess barriers to discharge/monitor patient progress toward functional and medical goals.  FIM: FIM - Bathing Bathing Steps Patient Completed: Chest;Abdomen;Front perineal area;Left Arm;Right upper leg;Left upper leg Bathing: 3: Mod-Patient completes 5-7 79f 10 parts or 50-74%  FIM - Upper Body Dressing/Undressing Upper body dressing/undressing steps patient completed: Put head through opening of pull over shirt/dress;Thread/unthread right sleeve of pullover shirt/dresss Upper body dressing/undressing: 3: Mod-Patient completed 50-74% of tasks (max cues) FIM - Lower Body Dressing/Undressing Lower body dressing/undressing steps patient completed: Thread/unthread right pants leg;Thread/unthread left pants leg Lower body dressing/undressing: 2: Max-Patient completed 25-49% of tasks  FIM - Toileting Toileting steps completed by patient: Performs perineal hygiene Toileting Assistive Devices: Grab bar or rail for support Toileting: 1: Two helpers  FIM - Radio producer Devices: Product manager Transfers: 1-Two helpers  FIM -  Control and instrumentation engineer Devices: Arm rests Bed/Chair Transfer: 1: Two helpers  FIM - Locomotion: Wheelchair Distance: 75 Locomotion: Wheelchair: 2: Travels 68 - 149 ft with minimal assistance (Pt.>75%) FIM - Locomotion: Ambulation Locomotion: Ambulation Assistive Devices:  (R arm rail) Ambulation/Gait Assistance: 1: +2 Total assist (mod A and + 2 assist for follow with the wheel chair) Locomotion: Ambulation: 0: Activity did not occur  Comprehension Comprehension Mode: Auditory Comprehension: 5-Understands basic 90% of the time/requires cueing < 10% of the time  Expression Expression Mode: Verbal Expression: 5-Expresses basic 90% of the time/requires cueing < 10% of the time.  Social Interaction Social Interaction: 4-Interacts appropriately 75 - 89% of the time - Needs redirection for appropriate language or to initiate interaction.  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 DVT Prophylaxis/Anticoagulation: SCD's, sq heparin.  2. Fibromyalgia/Lleft shoulder pain due to subluxation/ Pain Management:no pain c/os today                                           3. Anxiety disorder/ Mood: Poor awareness with apathy noted. Trial ritalin, titrate dose LCSW to follow for support and evaluation.  4. Neuropsych: This patient is not capable of making decisions on her own behalf.  5. HTN: will monitor with bid checks. Continue catapress, atenolol, norvasc and hydralazine. Generally improved. Sometimes her anxiety affects----bp's are good in  the am and peak during the mid day typically---may benefit from adjustment to timing of medications---increase hydralazine to TID today 6. Borderline diabetes: Hgb A1c-6.3/MFG-134. CM diet restrictions. SSI for elevated BS---most thickened liquids are sweetened.  7: Dyslipidemia: Continue zocor  8. CKD stage 3:  serial checks.  9. Dysphagia: continue D2,nectar liquids with aspiration precautions. IVF at bedtime to maintain adequate hydration. Patient/family non-compliant--continue to educate on aspiration risk.  10. Constipation: Continue miralax and senna.  57. Hyperactive bladder:secondary to CVA 12.  Left shoulder pain- subluxation, may also have adhesive capsulitis trial FES 13.  Nausea check meds, CMET,  14.  SOB- exam unremarkable no resp distress enc deep breathing stiop ritalin as this may cause anxiety LOS (Days) 14  A FACE TO FACE EVALUATION WAS PERFORMED  Amarri Satterly E 05/01/2013, 8:01 AM

## 2013-05-01 NOTE — Progress Notes (Signed)
[name] blood pressure 170/42, heartrate 52.  Marlowe [place], PA notified and advises to hold atenolol but that hydralazine can still be given.  Will continue to monitor.

## 2013-05-02 ENCOUNTER — Inpatient Hospital Stay (HOSPITAL_COMMUNITY): Payer: 59 | Admitting: Physical Therapy

## 2013-05-02 ENCOUNTER — Inpatient Hospital Stay (HOSPITAL_COMMUNITY): Payer: 59

## 2013-05-02 ENCOUNTER — Inpatient Hospital Stay (HOSPITAL_COMMUNITY): Payer: 59 | Admitting: *Deleted

## 2013-05-02 LAB — URINE MICROSCOPIC-ADD ON

## 2013-05-02 LAB — GLUCOSE, CAPILLARY
GLUCOSE-CAPILLARY: 129 mg/dL — AB (ref 70–99)
GLUCOSE-CAPILLARY: 142 mg/dL — AB (ref 70–99)
Glucose-Capillary: 146 mg/dL — ABNORMAL HIGH (ref 70–99)
Glucose-Capillary: 121 mg/dL — ABNORMAL HIGH (ref 70–99)

## 2013-05-02 LAB — URINALYSIS, ROUTINE W REFLEX MICROSCOPIC
BILIRUBIN URINE: NEGATIVE
GLUCOSE, UA: NEGATIVE mg/dL
HGB URINE DIPSTICK: NEGATIVE
Ketones, ur: NEGATIVE mg/dL
Nitrite: POSITIVE — AB
Protein, ur: 100 mg/dL — AB
Specific Gravity, Urine: 1.02 (ref 1.005–1.030)
Urobilinogen, UA: 0.2 mg/dL (ref 0.0–1.0)
pH: 5.5 (ref 5.0–8.0)

## 2013-05-02 MED ORDER — BOOST PLUS PO LIQD
237.0000 mL | Freq: Three times a day (TID) | ORAL | Status: DC
  Administered 2013-05-02 – 2013-05-03 (×3): 237 mL via ORAL
  Filled 2013-05-02 (×22): qty 237

## 2013-05-02 MED ORDER — MEGESTROL ACETATE 400 MG/10ML PO SUSP
400.0000 mg | Freq: Two times a day (BID) | ORAL | Status: DC
  Administered 2013-05-02 – 2013-05-03 (×3): 400 mg via ORAL
  Filled 2013-05-02 (×16): qty 10

## 2013-05-02 NOTE — Plan of Care (Signed)
Problem: RH Wheelchair Mobility Goal: LTG Patient will propel w/c in community environment (PT) LTG: Patient will propel wheelchair in community environment, # of feet with assist (PT)  Outcome: Not Applicable Date Met:  05/02/13 Goal discharged secondary to slow pt progress.     

## 2013-05-02 NOTE — Progress Notes (Addendum)
Physical Therapy Session Note  Patient Details  Name: Kelli [name] MRN: [id number] Date of Birth: 03-25-1950  Today's Date: 05/02/2013 Time: [id number] Time Calculation (min): 44 min  Short Term Goals: Week 2:  PT Short Term Goal 1 (Week 2): Pt to consistently perform supine<>sit with min A with HOB flat without rail. PT Short Term Goal 2 (Week 2): Pt to consistently perform bed<>chair transfer with min A. PT Short Term Goal 3 (Week 2): Pt to perform w/c mobility x50' in controlled environment with min A and min cueing for L-sided attention. PT Short Term Goal 4 (Week 2): Pt to perform sit<>stand with mod A using w/c arm rest. PT Short Term Goal 5 (Week 2): Pt to perform gait x15' in controlled encironment with +1Total A.  Skilled Therapeutic Interventions/Progress Updates:    Pt received seated on toilet with RN and nurse tech present. Performed sit>stand from toilet with min A, grab bars. Static standing x15 seconds with mod A, RUE support at grab bars. From standing, performed stand pivot transfer from toilet>w/c with max A. Per RN request to scan bladder, performed squat pivot from w/c>bed with mod A; sit>supine with mod A for bilat LE management. Performed supine>sit with mod A at trunk using bed rail.   Seated EOM, performed static sitting balance with RUE support at bed rail in multiple1 to 2-minute increments with close supervision. Seated EOB, performed bilat scooting with close supervision to min guard. Performed sit<>stand transfers x5 total with RUE support at bed rail, min A, tactile cueing at L knee to increase LLE weightbearing, verbal cueing to promote midline posture during transfer (secondary to pt tendency to lean to R side); pt required frequent reinforcement of cues. Static standing x5-15 seconds with RUE support and min A, manual stabilization at L knee during ~25-50% of standing to prevent L knee buckling, max multimodal cueing for attention to task, hip bilat hip extension.  Performed scoot pivot transfer from bed>w/c with min A, subtle cueing for safe hand placement. Pt able to recall >75% of steps for safe transfer setup. Therapist departed with pt seated in w/c with quick release belt on for safety and all needs within reach. Pt in no apparent distress.   Addendum: The following long term goals were downgraded secondary to slow pt progress: bed mobility, w/c mobility distance in controlled environment, and attention in controlled environment. The following goals were discharged due to slow pt progress and secondary to change in planned discharge destination: dynamic standing balance, car transfer, w/c mobility in community environment, stair negotiation, and gait in controlled, home, and community environments.  Therapy Documentation Precautions:  Precautions Precautions: Fall Precaution Comments: Lt field deficit and Lt inattention; LUE subluxation  Restrictions Weight Bearing Restrictions: No Vital Signs: Therapy Vitals Pulse Rate: 58 BP: 160/80 mmHg Patient Position, if appropriate: Lying Pain: Pain Assessment Pain Assessment: No/denies pain Pain Score: 0-No pain Pain Location: Shoulder  See FIM for current functional status  Therapy/Group: Individual Therapy  Hobble, Malva Cogan 05/02/2013, 10:44 AM

## 2013-05-02 NOTE — Progress Notes (Addendum)
Social Work Patient ID: Kelli [name], female   DOB: 22-Sep-1950, 63 y.o.   MRN: [id number]  CSW met with pt to update her on team conference discussion.  Before CSW could really begin telling pt, she interjected that she knows she is not making progress and that she feels she will need to transfer to another rehab facility.  CSW told pt that the team agrees with her and that we want what's best for her.  Pt was quite frustrated and discouraged today with flat affect.  She is tired of "hospital living" and feels like she just did this in August and is too old to keep "rehabing" this hard.  CSW asked pt if she was ready for a change of scenery and it seems she is.  CSW told pt that CSW would follow up with pt's husband to discuss d/c plan with him.  When CSW talked to him, he agreed with SNF transfer.  He is thinking about [facility] and will go visit there in the next day or two.  Pt wanted [facility], but they may not accept pt's insurance. CSW will follow up with SNF to see if they do accept [facility]  CSW shared with pt and husband that anticipates pt being able to transfer to SNF at the beginning-middle of next week, as pt still needs a repeat MBS and MD to assess how pt's intake is and what her fluid/nutrition needs will be.  CSW updated pt's insurance case manager of the above.  CSW to work with pt and her husband to find a SNF bed appropriate for pt.  Pt to see neuropsychologist on 05-03-13.

## 2013-05-02 NOTE — Patient Care Conference (Signed)
Inpatient RehabilitationTeam Conference and Plan of Care Update Date: 05/02/2013   Time: 10:40 AM   Patient Name: Kelli [name]      Medical Record Number: [id number]  Date of Birth: 1951/01/27 Sex: Female         Room/Bed: 4W22C/4W22C-01 Payor Info: Payor: Theme park manager / Plan: Theme park manager / Product Type: *No Product type* /    Admitting Diagnosis: CVA  Admit Date/Time:  04/17/2013  5:44 PM Admission Comments: No comment available   Primary Diagnosis:  <principal problem not specified> Principal Problem: <principal problem not specified>  Patient Active Problem List   Diagnosis Date Noted  . Acute CVA (cerebrovascular accident): R PCA infarct per MRI 04/13/13 04/13/2013  . CVA (cerebral infarction) 04/08/2013  . Acute ischemic stroke 04/08/2013  . Diplopia 04/08/2013  . Left-sided weakness 04/08/2013  . Vertigo 04/08/2013  . Paresthesias in left hand 04/08/2013  . Pre-diabetes 04/08/2013  . Cerebellar stroke 04/08/2013  . Dizziness and giddiness 04/08/2013  . Stroke 04/08/2013  . Depression 04/06/2013  . Peripheral arterial disease 03/16/2013  . History of stroke 01/30/2013  . Precordial pain 01/16/2013  . CKD (chronic kidney disease) stage 3, GFR 30-59 ml/min 01/16/2013  . Anxiety state, unspecified 12/08/2012  . Lumbago 12/08/2012  . Urinary frequency 11/14/2012  . Occlusion and stenosis of carotid artery with cerebral infarction 10/05/2012  . Mixed hyperlipidemia 10/05/2012  . Renal insufficiency 10/05/2012  . Hypertension, uncontrolled 10/04/2012  . Tobacco abuse 10/04/2012  . Obesity 10/04/2012    Expected Discharge Date: Expected Discharge Date: 05/12/13  Team Members Present: Physician leading conference: Dr. Alysia Penna Social Worker Present: Alfonse Alpers, LCSW Nurse Present: Lavon Paganini, RN;Other (comment) Lynett Fish, RN) PT Present: Georjean Mode, PT;Other (comment) Benjie Karvonen Hobble, RN) OT Present: Gareth Morgan, OT;Kris  Leia Alf, OT SLP Present: Germain Osgood, SLP PPS Coordinator present : Daiva Nakayama, RN, CRRN;Becky Alwyn Ren, PT     Current Status/Progress Goal Weekly Team Focus  Medical   anxiety, multiple somatic c/os, shoulder pain, burning pain L thigh last noc  manage multiple changing pain c/os  med management, physical med management   Bowel/Bladder   BM 05/01/13 after suppository, incontinent of bladder at times  Maintain regular pattern of bowel emptying with medication, timed toileting and briefs for bladder continence  Timed toileting during day and at bedtime, encourage patient to take scheduled stool softener/laxative to prevent constipation   Swallow/Nutrition/ Hydration   Dys 2 textures and nectar thick liquids with ice chips per water protocol  supervision with least restrictive PO  trials of advanced textures   ADL's   max assist toilet transfers, mod-max assist tub/shower transfers, mod-max UB dressing, max assist LB dressing, max assist toilet task, L inattention  min-mod assist overall  L NMR, functional transfers, postural control in standing, L attention, awareness, family training/education   Mobility   Mod A with transfers, +2A with gait, min A with w/c mobility; PT sessions limited by nausea  Min A at w/c level  Transfer training, postural stability, L-sided attention, midline orientation, initiate family training, D/C planning   Communication             Safety/Cognition/ Behavioral Observations  Mod for sustained attention, basic problem solving, left-attention  supervision  recall, sustained attention, problem solving, left-attention, orientation   Pain   Left shoulder pain at times with voltaren gen in use  </=3  Maintain sling as needed   Skin   Incision to left chest with steri strips  No new skin  breakdown  Pressure relief q1-2 hr    Rehab Goals Patient on target to meet rehab goals: No Rehab Goals Revised: PT and OT goals downgraded due to pt making slow  progress. *See Care Plan and progress notes for long and short-term goals.  Barriers to Discharge: anticipate heavy assist    Possible Resolutions to Barriers:  SNF    Discharge Planning/Teaching Needs:  Pt's husband is willing to hire private caregivers.  He is leaning toward pt needing a short SNF stay prior to returning to their home.  Family will attend family education closer to d/c.   Team Discussion:  Pt with multiple complaints of pain, which may be in part because her awareness of her deficits is better.  Pt complains of a burning pain in her leg and this may be a good sign of improvement in that affected leg.  Pt is making minimal progress and is more distracted by her deficits, which makes it harder for her to focus on therapies, thus she's not able to get the most out of her sessions.  Pt has had mild cognitive improvement, with better awareness, yet still poor inattention.  Pt's diet cannot be advanced until her swallowing is better and SLP will do a repeat MBS in the next day or two.  Revisions to Treatment Plan:  PT goals downgraded to supervision at w/c level, with discontinuing ambulation and stairs goals and OT goals are min-mod assist.     Continued Need for Acute Rehabilitation Level of Care: The patient requires daily medical management by a physician with specialized training in physical medicine and rehabilitation for the following conditions: Daily direction of a multidisciplinary physical rehabilitation program to ensure safe treatment while eliciting the highest outcome that is of practical value to the patient.: Yes Daily medical management of patient stability for increased activity during participation in an intensive rehabilitation regime.: Yes Daily analysis of laboratory values and/or radiology reports with any subsequent need for medication adjustment of medical intervention for : Other;Neurological problems  Yoshi Mancillas, Silvestre Mesi 05/02/2013, 12:07 PM

## 2013-05-02 NOTE — Plan of Care (Signed)
Problem: RH Balance Goal: LTG Patient will maintain dynamic standing with ADLs (OT) LTG: Patient will maintain dynamic standing balance with assist during activities of daily living (OT)  Downgraded to mod assist secondary to slow progression  Problem: RH Bathing Goal: LTG Patient will bathe with assist, cues/equipment (OT) LTG: Patient will bathe specified number of body parts with assist with/without cues using equipment (position) (OT)  Downgraded secondary to slow progress in therapy  Problem: RH Dressing Goal: LTG Patient will perform lower body dressing w/assist (OT) LTG: Patient will perform lower body dressing with assist, with/without cues in positioning using equipment (OT)  Downgraded secondary to patients slow progress in therapy  Problem: RH Toileting Goal: LTG Patient will perform toileting w/assist, cues/equip (OT) LTG: Patient will perform toiletiing (clothes management/hygiene) with assist, with/without cues using equipment (OT)  Downgraded secondary to patients slow progress in therapy  Problem: RH Toilet Transfers Goal: LTG Patient will perform toilet transfers w/assist (OT) LTG: Patient will perform toilet transfers with assist, with/without cues using equipment (OT)  Downgraded secondary to pt's slow progress in therapy.   Problem: RH Tub/Shower Transfers Goal: LTG Patient will perform tub/shower transfers w/assist (OT) LTG: Patient will perform tub/shower transfers with assist, with/without cues using equipment (OT)  Downgraded secondary to patients slow progress in therapy

## 2013-05-02 NOTE — Plan of Care (Signed)
Problem: RH Stairs Goal: LTG Patient will ambulate up and down stairs w/assist (PT) LTG: Patient will ambulate up and down # of stairs with assistance (PT)  Outcome: Not Applicable Date Met:  02/72/53 Goal discharged secondary to slow pt progress.

## 2013-05-02 NOTE — Plan of Care (Signed)
Problem: RH Balance Goal: LTG Patient will maintain dynamic standing balance (PT) LTG: Patient will maintain dynamic standing balance with assistance during mobility activities (PT)  Outcome: Not Applicable Date Met:  80/99/83 N/A, as dynamic standing will not be functional for pt at discharge.

## 2013-05-02 NOTE — Plan of Care (Signed)
Problem: RH Ambulation Goal: LTG Patient will ambulate in home environment (PT) LTG: Patient will ambulate in home environment, # of feet with assistance (PT).  Outcome: Not Applicable Date Met:  43/73/57 Gait not safe means of functional mobility at this time.

## 2013-05-02 NOTE — Plan of Care (Signed)
Problem: RH Ambulation Goal: LTG Patient will ambulate in controlled environment (PT) LTG: Patient will ambulate in a controlled environment, # of feet with assistance (PT).  Outcome: Not Applicable Date Met:  16/01/09 N/A; gait not safe means of functional mobility at this time.

## 2013-05-02 NOTE — Progress Notes (Signed)
Occupational Therapy Session Note  Patient Details  Name: Kelli [name] MRN: [id number] Date of Birth: 08-07-1950  Today's Date: 05/02/2013 Time: [id number] and [id number] Time Calculation (min): 39 min and 45 min   Short Term Goals: Week 2:  OT Short Term Goal 1 (Week 2): Pt will sit unsupported EOB/EOM for 30 seconds with min assist while participating in functional task OT Short Term Goal 2 (Week 2): Pt will complete toilet transfer with mod assist  OT Short Term Goal 3 (Week 2): Pt will assist with positioning of LUE during self-care tasks with mod cueing 50% of session  OT Short Term Goal 4 (Week 2): Pt will complete toilet task with max assist   Skilled Therapeutic Interventions/Progress Updates:    Session 1: Pt seen for ADL retraining with focus on sitting balance, attention to task, and attention to L. Pt received supine in bed sleeping and easily aroused. BP in supine was 176/56 with HR 52. RN notified. Pt willing to participate in therapy. Required max assist and mod instructional cues for supine>sit. Pt with strong right gaze and lateral lean to left upon sitting requiring min assist for static sitting balance. Began engaging in bathing EOB with min-max assist dynamic sitting balance. Pt reporting head was "throbbing" and beginning to feel light headed. Attempted to assist pt with sit>supine however requesting to don shirt first. Pt required max assist and max cues to attend to task and don correctly. Pt required max cues for visual attention to task as well as pt staring at wall on right side. Unable to take BP sitting EOB as pt unsafe to leave alone sitting upright. Pt continued reporting headache and dizziness. Assisted pt sit>supine with max assist. BP 178/59 and HR 49 while supine in bed with HOB elevated. Noted RN immediately.   Session 2: Pt seen for 1:1 OT session with focus on functional transfers, overall attention and awareness, sit<>stand, and NMR to RUE. Pt received supine in  bed requesting to go to bathroom. Pt completed stand pivot transfer w/c>toilet with max assist and required mod assist for standing balance at grab bar as therapist assisted with clothing management. Completed squat pivot transfer toilet>w/c with light max assist. Pt with clean brief and did not urinate however requesting to change brief. Completed LB dressing at sink requiring assist for positioning of LUE for threading. Pt with SOB x3 and required mod cues to attend to task completing as she would begin gazing to right and speaking about things unrelated to task at hand. Completed sit<>stand x3 with mod-max assist and manual facilitation for posture. Provided NMES to posterior deltoid and supraspinatus muscles for strengthening, proprioceptive input, and decreased pain to facilitate motor relearning and allow for increased ROM. Pt tolerated 10 min well and reported no pain at end of session and during ROM not exceeding 90 degrees shoulder flexion. Pt left sitting in w/c with all needs in reach.   Therapy Documentation Precautions:  Precautions Precautions: Fall Precaution Comments: Lt field deficit and Lt inattention; LUE subluxation  Restrictions Weight Bearing Restrictions: No General: General Amount of Missed OT Time (min): 21 Minutes Vital Signs: Therapy Vitals Temp: 97.7 F (36.5 C) Temp src: Oral Pulse Rate: 52 Resp: 18 BP: 176/56 mmHg Patient Position, if appropriate: Lying Oxygen Therapy SpO2: 98 % O2 Device: None (Room air) Pain: Pt reporting headache. RN notified.   See FIM for current functional status  Therapy/Group: Individual Therapy  Duayne Cal 05/02/2013, 7:46 AM

## 2013-05-02 NOTE — Progress Notes (Signed)
Speech Language Pathology Weekly Progress and Session Notes  Patient Details  Name: Kelli [name] MRN: [id number] Date of Birth: [birth year]  Today's Date: 05/02/2013 Time: 8676-7209 Time Calculation (min): 45 min  Short Term Goals: Week 2: SLP Short Term Goal 1 (Week 2): Pt will utilize safe swallowing strategies with current diet with Min cues SLP Short Term Goal 1 - Progress (Week 2): Met SLP Short Term Goal 2 (Week 2): Pt attend to her left visual field during basic functional task with Mod cues SLP Short Term Goal 2 - Progress (Week 2): Met SLP Short Term Goal 3 (Week 2): Pt will sustain attention to basic functional task for 5 minutes with Mod cues SLP Short Term Goal 3 - Progress (Week 2): Met SLP Short Term Goal 4 (Week 2): Pt will utilize environmental cues to demonstrate orientation x4 with Mod cues SLP Short Term Goal 4 - Progress (Week 2): Not met SLP Short Term Goal 5 (Week 2): Pt will demonstrate adequate problem solving during basic functional task with Mod cues SLP Short Term Goal 5 - Progress (Week 2): Met SLP Short Term Goal 6 (Week 2): Pt will demonstrate effective mastication and oral clearance with Dys 3 textures with Min cues SLP Short Term Goal 6 - Progress (Week 2): Not met    New Short Term Goals: Week 3: SLP Short Term Goal 1 (Week 3): Pt will utilize safe swallowing strategies with current diet with supervision SLP Short Term Goal 2 (Week 3): Pt attend to her left visual field during basic functional task with Min cues SLP Short Term Goal 3 (Week 3): Pt will sustain attention to basic functional task for 15 minutes with Min cues SLP Short Term Goal 4 (Week 3): Pt will utilize environmental cues to demonstrate orientation x4 with Mod cues SLP Short Term Goal 5 (Week 3): Pt will demonstrate adequate problem solving during basic functional task with Min cues SLP Short Term Goal 6 (Week 3): Pt will demonstrate effective mastication and oral clearance with Dys 3  textures with Min cues  Weekly Progress Updates: Pt has met 4 out of 6 STGs during this reporting period, due to mild improvements with intellectual awareness and sustained attention. She continues to require Mod-Max cues for sustained attention to tasks, which impacts her readiness to advance POs. She is consuming Dys 2 textures and nectar thick liquids with ice chips per the water protocol. Will repeat MBS to assess readiness for advancement of liquids due to silent aspiration risk. Education is ongoing with patient and family. Due to slow progression and likely change in discharge plan, LTGs have been downgraded (refer to Care Plan). Pt will benefit from continued SLP services to maximize swallowing safety and cognitive function to decrease caregiver burden.   Intensity: Minumum of 1-2 x/day, 30 to 90 minutes Frequency: 5 out of 7 days Duration/Length of Stay: 22-28 days Treatment/Interventions: Cognitive remediation/compensation;Cueing hierarchy;Dysphagia/aspiration precaution training;Environmental controls;Functional tasks;Internal/external aids;Patient/family education;Oral motor exercises   Daily Session Skilled Therapeutic Interventions: Skilled treatment focused on cognitive and swallowing goals. SLP facilitated session with Min cues for thorough oral care prior to observation of thin liquid trials. Pt consumed thin water with gentle throat clear x2. Although aspiration on previous MBS was silent, pt did exhibit less overt s/s of aspiration at bedside today than during previous trials. Will plan to repeat MBS to assess for readiness to upgrade. Pt requested recommendations for Dys 2 textures that her family could bring her. SLP facilitated with generation of list  of food that pt prefers and how family can make it an appropriate texture. Pt's son arrived at the end of the session and was educated regarding Dys 2 textures and plan for [facility] tomorrow.       FIM:  Comprehension Comprehension  Mode: Auditory Comprehension: 5-Understands basic 90% of the time/requires cueing < 10% of the time Expression Expression Mode: Verbal Expression: 5-Expresses basic 90% of the time/requires cueing < 10% of the time. Social Interaction Social Interaction: 5-Interacts appropriately 90% of the time - Needs monitoring or encouragement for participation or interaction. Problem Solving Problem Solving: 3-Solves basic 50 - 74% of the time/requires cueing 25 - 49% of the time Memory Memory: 4-Recognizes or recalls 75 - 89% of the time/requires cueing 10 - 24% of the time FIM - Eating Eating Activity: 5: Supervision/cues General    Pain Pain Assessment Pain Assessment: No/denies pain  Therapy/Group: Individual Therapy   Germain Osgood, M.A. CCC-SLP [phone]  Germain Osgood 05/02/2013, 4:01 PM

## 2013-05-02 NOTE — Progress Notes (Signed)
Subjective/Complaints: 63 y.o. 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 She was admitted on 04/08/2013 with dizziness, N/V as well as left-sided numbness and weakness.   Slept poorly, had dizziness in therapy this am ROS:  Constipation, bladder working ok, weakness Objective: Vital Signs: Blood pressure 176/56, pulse 52, temperature 97.7 F (36.5 C), temperature source Oral, resp. rate 18, weight 87 kg (191 lb 12.8 oz), SpO2 98.00%. Ct Angio Chest Aortic Dissect W &/or W/o  04/17/2013   CLINICAL DATA:  Extensive atheromatous plaque in the thoracic aorta. Evaluate for potential penetrating aortic ulcer.  EXAM: CT ANGIOGRAPHY CHEST WITH CONTRAST  TECHNIQUE: Multidetector CT imaging of the chest was performed using the standard protocol during bolus administration of intravenous contrast. Multiplanar CT image reconstructions and MIPs were obtained to evaluate the vascular anatomy.  CONTRAST:  13m OMNIPAQUE IOHEXOL 350 MG/ML SOLN  COMPARISON:  No priors.  FINDINGS: Mediastinum: Precontrast images demonstrate no crescentic high attenuation associated with the wall of the thoracic aorta to suggest acute intramural hemorrhage at this time. Post-contrast images demonstrate extensive atheromatous plaque throughout the distal aortic arch and descending thoracic aorta. Some of these aortic plaques are ulcerated, best demonstrated on image 81 of series 5 in the distal descending thoracic aorta, but there is no frank penetrating aortic ulcer or signs of dissection at this time. Atheromatous plaque in the great vessels of the mediastinum is also noted, including either high-grade stenosis or complete occlusion of the proximal left subclavian artery. Flow on the distal left subclavian artery is noted, either related to incomplete obstruction, or collateral  pathways. Heart size is borderline enlarged. There is no significant pericardial fluid, thickening or pericardial calcification. Calcifications of the mitral annulus. Small hiatal hernia. No pathologically enlarged mediastinal or hilar lymph nodes.  Lungs/Pleura: Linear opacities in the left lower lobe likely reflect chronic scarring. No acute consolidative airspace disease. No pleural effusions. No definite suspicious appearing pulmonary nodules or masses are identified.  Upper Abdomen: Unremarkable.  Musculoskeletal: There are no aggressive appearing lytic or blastic lesions noted in the visualized portions of the skeleton.  Review of the MIP images confirms the above findings.  IMPRESSION: 1. Extensive atheromatous plaque in the thoracic aorta and great vessels of the mediastinum. Although there are several ulcerated atheromatous plaques, there is no evidence of frank penetrating aortic ulcer or thoracic aortic dissection at this time. 2. There is, however, either high-grade stenosis or complete occlusion of the proximal left subclavian artery, with distal flow in the left subclavian artery related to either incomplete occlusion or collateralization. 3. Small hiatal hernia.   Electronically Signed   By: DVinnie LangtonM.D.   On: 04/17/2013 16:32   Results for orders placed during the hospital encounter of 04/17/13 (from the past 72 hour(s))  GLUCOSE, CAPILLARY     Status: Abnormal   Collection Time    04/29/13 11:44 AM      Result Value Ref Range   Glucose-Capillary 122 (*) 70 - 99 mg/dL   Comment 1 Notify RN    GLUCOSE, CAPILLARY     Status: Abnormal   Collection Time    04/29/13  4:50 PM      Result Value Ref Range   Glucose-Capillary 139 (*) 70 - 99 mg/dL   Comment 1 Notify RN    GLUCOSE, CAPILLARY     Status: Abnormal   Collection Time  04/29/13  9:16 PM      Result Value Ref Range   Glucose-Capillary 103 (*) 70 - 99 mg/dL   Comment 1 Notify RN    GLUCOSE, CAPILLARY     Status:  Abnormal   Collection Time    04/30/13  7:12 AM      Result Value Ref Range   Glucose-Capillary 170 (*) 70 - 99 mg/dL   Comment 1 Notify RN    GLUCOSE, CAPILLARY     Status: Abnormal   Collection Time    04/30/13 11:04 AM      Result Value Ref Range   Glucose-Capillary 136 (*) 70 - 99 mg/dL   Comment 1 Notify RN    GLUCOSE, CAPILLARY     Status: Abnormal   Collection Time    04/30/13  4:17 PM      Result Value Ref Range   Glucose-Capillary 126 (*) 70 - 99 mg/dL  GLUCOSE, CAPILLARY     Status: Abnormal   Collection Time    04/30/13  8:38 PM      Result Value Ref Range   Glucose-Capillary 166 (*) 70 - 99 mg/dL  GLUCOSE, CAPILLARY     Status: Abnormal   Collection Time    05/01/13  7:18 AM      Result Value Ref Range   Glucose-Capillary 133 (*) 70 - 99 mg/dL   Comment 1 Notify RN    COMPREHENSIVE METABOLIC PANEL     Status: Abnormal   Collection Time    05/01/13 10:55 AM      Result Value Ref Range   Sodium 140  137 - 147 mEq/L   Potassium 4.4  3.7 - 5.3 mEq/L   Chloride 104  96 - 112 mEq/L   CO2 21  19 - 32 mEq/L   Glucose, Bld 155 (*) 70 - 99 mg/dL   BUN 19  6 - 23 mg/dL   Creatinine, Ser 1.44 (*) 0.50 - 1.10 mg/dL   Calcium 9.4  8.4 - 10.5 mg/dL   Total Protein 7.0  6.0 - 8.3 g/dL   Albumin 3.4 (*) 3.5 - 5.2 g/dL   AST 16  0 - 37 U/L   ALT 18  0 - 35 U/L   Alkaline Phosphatase 77  39 - 117 U/L   Total Bilirubin 0.3  0.3 - 1.2 mg/dL   GFR calc non Af Amer 38 (*) >90 mL/min   GFR calc Af Amer 44 (*) >90 mL/min   Comment: (NOTE)     The eGFR has been calculated using the CKD EPI equation.     This calculation has not been validated in all clinical situations.     eGFR's persistently <90 mL/min signify possible Chronic Kidney     Disease.  GLUCOSE, CAPILLARY     Status: Abnormal   Collection Time    05/01/13 11:18 AM      Result Value Ref Range   Glucose-Capillary 148 (*) 70 - 99 mg/dL   Comment 1 Notify RN    GLUCOSE, CAPILLARY     Status: Abnormal    Collection Time    05/01/13  4:46 PM      Result Value Ref Range   Glucose-Capillary 107 (*) 70 - 99 mg/dL  GLUCOSE, CAPILLARY     Status: Abnormal   Collection Time    05/01/13  8:35 PM      Result Value Ref Range   Glucose-Capillary 163 (*) 70 - 99 mg/dL  GLUCOSE, CAPILLARY  Status: Abnormal   Collection Time    05/02/13  7:19 AM      Result Value Ref Range   Glucose-Capillary 129 (*) 70 - 99 mg/dL   Comment 1 Notify RN        General: No acute distress Mood and affect are appropriate Heart: Regular rate and rhythm no rubs murmurs or extra sounds Lungs: Clear to auscultation, breathing unlabored, no rales or wheezes Abdomen: Positive bowel sounds, soft nontender to palpation, nondistended Extremities: No clubbing, cyanosis, or edema Skin: No evidence of breakdown, no evidence of rash Neurologic: Cranial nerves II through XII intact, motor strength is 5/5 in Right deltoid, bicep, tricep, grip, hip flexor, knee extensors, ankle dorsiflexor and plantar flexor 0/5 on left side except trace Left Hip add,2- KE Sensory exam normal sensation to light touch and proprioception in right upper and lower extremities, feels deep pressure on left side Cerebellar exam cannot assess on left secondary to weakness Musculoskeletal: Pain with Left shouldr ROM, + Sublux Right gaze preference.   Assessment/Plan: 1. Functional deficits secondary to Right PCA infarct with Left HP, L HH which require 3+ hours per day of interdisciplinary therapy in a comprehensive inpatient rehab setting. Physiatrist is providing close team supervision and 24 hour management of active medical problems listed below. Physiatrist and rehab team continue to assess barriers to discharge/monitor patient progress toward functional and medical goals. Team conference today please see physician documentation under team conference tab, met with team face-to-face to discuss problems,progress, and goals. Formulized individual  treatment plan based on medical history, underlying problem and comorbidities. FIM: FIM - Bathing Bathing Steps Patient Completed: Chest;Abdomen;Front perineal area;Left Arm;Right upper leg;Left upper leg Bathing: 3: Mod-Patient completes 5-7 [id number] parts or 50-74%  FIM - Upper Body Dressing/Undressing Upper body dressing/undressing steps patient completed: Put head through opening of pull over shirt/dress Upper body dressing/undressing: 2: Max-Patient completed 25-49% of tasks FIM - Lower Body Dressing/Undressing Lower body dressing/undressing steps patient completed: Thread/unthread right underwear leg;Thread/unthread right pants leg Lower body dressing/undressing: 2: Max-Patient completed 25-49% of tasks  FIM - Toileting Toileting steps completed by patient: Performs perineal hygiene Toileting Assistive Devices: Grab bar or rail for support Toileting: 1: Two helpers  FIM - TRadio producerDevices: GProduct managerTransfers: 1-Two helpers  FIM - BControl and instrumentation engineerDevices: HOB elevated Bed/Chair Transfer: 2: Supine > Sit: Max A (lifting assist/Pt. 25-49%);1: Sit > Supine: Total A (helper does all/Pt. < 25%)  FIM - Locomotion: Wheelchair Distance: 75 Locomotion: Wheelchair: 1: Total Assistance/staff pushes wheelchair (Pt<25%) FIM - Locomotion: Ambulation Locomotion: Ambulation Assistive Devices:  (R arm rail) Ambulation/Gait Assistance: 1: +2 Total assist (mod A and + 2 assist for follow with the wheel chair) Locomotion: Ambulation: 0: Activity did not occur  Comprehension Comprehension Mode: Auditory Comprehension: 5-Understands basic 90% of the time/requires cueing < 10% of the time  Expression Expression Mode: Verbal Expression: 5-Expresses basic 90% of the time/requires cueing < 10% of the time.  Social Interaction Social Interaction: 4-Interacts appropriately 75 - 89% of the time - Needs redirection for  appropriate language or to initiate interaction.  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 DVT Prophylaxis/Anticoagulation: SCD's, sq heparin.  2. Fibromyalgia/Lleft shoulder pain due to subluxation/ Pain Management:no pain c/os today                                           3. Anxiety disorder/ Mood: Poor awareness with apathy noted. Trial ritalin, titrate dose LCSW to follow for support and evaluation.  4. Neuropsych: This patient is not capable of making decisions on her own behalf.  5. HTN: will monitor with bid checks. Continue catapress, atenolol, norvasc and hydralazine. Generally improved. Check orthostatics given dizziness c/o, parameters for BB 6. Borderline diabetes: Hgb A1c-6.3/MFG-134. CM diet restrictions. SSI for elevated BS---most thickened liquids are sweetened.  7: Dyslipidemia: Continue zocor  8. CKD stage 3:  serial checks.  9. Dysphagia: continue D2,nectar liquids with aspiration precautions. IVF at bedtime to maintain adequate hydration. Patient/family non-compliant--continue to educate on aspiration risk.  10. Constipation: Continue miralax and senna.  56. Hyperactive bladder:secondary to CVA 12.  Left shoulder pain- subluxation, may also have adhesive capsulitis trial FES 13.  Nausea check meds, CMET,  14.  SOB- exam unremarkable no resp distress enc deep breathing stiop ritalin as this may cause anxiety LOS (Days) 15  A FACE TO FACE EVALUATION WAS PERFORMED  KIRSTEINS,ANDREW E 05/02/2013, 9:22 AM

## 2013-05-03 ENCOUNTER — Inpatient Hospital Stay (HOSPITAL_COMMUNITY): Payer: 59 | Admitting: Physical Therapy

## 2013-05-03 ENCOUNTER — Inpatient Hospital Stay (HOSPITAL_COMMUNITY): Payer: 59

## 2013-05-03 ENCOUNTER — Encounter (HOSPITAL_COMMUNITY): Payer: 59

## 2013-05-03 DIAGNOSIS — I6350 Cerebral infarction due to unspecified occlusion or stenosis of unspecified cerebral artery: Secondary | ICD-10-CM

## 2013-05-03 LAB — GLUCOSE, CAPILLARY
Glucose-Capillary: 134 mg/dL — ABNORMAL HIGH (ref 70–99)
Glucose-Capillary: 137 mg/dL — ABNORMAL HIGH (ref 70–99)
Glucose-Capillary: 131 mg/dL — ABNORMAL HIGH (ref 70–99)
Glucose-Capillary: 139 mg/dL — ABNORMAL HIGH (ref 70–99)

## 2013-05-03 NOTE — Progress Notes (Addendum)
Occupational Therapy Weekly Progress Note  Patient Details  Name: Kelli [name] MRN: [id number] Date of Birth: 19-Dec-1950  Today's Date: 05/03/2013 Time: 0730-0830 Time Calculation (min): 60 min  Patient has met 2 of 4 short term goals.  [name] progress in therapy has been very slow. Patient is consistently max assist for toilet transfers with an occasional mod assist. Patient has been limited by overall cognitive impairments. She is beginning to demonstrate increased awareness of deficits, therefore demonstrating flat affect and decreased motivation. [name] awareness is impacting her participation in therapy as she is internally distracted by all deficits. Patient is demonstrating increased awareness and positioning of LUE/LLE during self-care tasks, requiring mod cues.   Patient continues to demonstrate the following deficits: L hemiplegia, decreased attention, decreased awareness of L, decreased strength, decreased postural control in sitting and standing, decreased ability to compensate for deficits, impaired overall cognition, decreased standing balance/tolerance, decreased activity tolerance and therefore will continue to benefit from skilled OT intervention to enhance overall performance with BADLs, ability to compensate for deficits, postural control in standing and sitting, activity tolerance.  Patient not progressing toward long term goals.  See goal revision..  Plan of care revisions: Due to patient's slow progress in therapy, patient and husband agreeable that SNF is best plan for discharge at this time..  OT Short Term Goals Week 2:  OT Short Term Goal 1 (Week 2): Pt will sit unsupported EOB/EOM for 30 seconds with min assist while participating in functional task OT Short Term Goal 1 - Progress (Week 2): Met OT Short Term Goal 2 (Week 2): Pt will complete toilet transfer with mod assist  OT Short Term Goal 2 - Progress (Week 2): Partly met OT Short Term Goal 3 (Week 2): Pt will  assist with positioning of LUE during self-care tasks with mod cueing 50% of session  OT Short Term Goal 3 - Progress (Week 2): Met OT Short Term Goal 4 (Week 2): Pt will complete toilet task with max assist  OT Short Term Goal 4 - Progress (Week 2): Partly met Week 3:  OT Short Term Goal 1 (Week 3): Focus on LTGs  Skilled Therapeutic Interventions/Progress Updates:    Pt seen for ADL retraining with focus on functional transfers, sit<>stand, attention to task, sequencing, and attention to L. Pt received supine in bed. Completed supine>sit with HOB elevated and max assist. RN present to provide medication with pt sitting unsupported at supervision level. Pt required max cues to bring head to midline and left d/t right gaze. Pt requesting to completed toilet task. Required mod assist for stand pivot transfer to right w/c>toilet using grab bar. Pt with BM requiring total assist for hygiene. Attempted to complete squat pivot transfer back to w/c however pt standing despite cues from therapist requiring max assist stand pivot transfer to left. Completed bathing and dressing from w/c at sink. Pt with increased attention to positioning of LUE during bathing required mod cues. Pt required mod cues for attention to task and scanning to L to locate items. Pt required max assist for orientation and sequencing of shirt and pants. Therapist assisted with positioning of LLE during LB dressing with pt's focus on anterior weight shift for threading. Pt required max cues and manual facilitation for anterior weight shift for all sit<>stand and mod physical assist. At end of session pt left sitting in w/c with all needs in reach.  Pt with increased left attention/neglect this AM requiring max cues. Patient also disoriented stating "Am I  at home?" Provided questioning cues and pt stated "I know I'm at the hospital." Pt then identify name of hospital. Pt with flat affect throughout session.   Therapy  Documentation Precautions:  Precautions Precautions: Fall Precaution Comments: Lt field deficit and Lt inattention; LUE subluxation  Restrictions Weight Bearing Restrictions: No General:   Vital Signs: Therapy Vitals BP: 170/60 mmHg Pain: Pt reporting burning sensation in L thigh. RN aware.  See FIM for current functional status  Therapy/Group: Individual Therapy  Duayne Cal 05/03/2013, 9:12 AM

## 2013-05-03 NOTE — Procedures (Addendum)
Objective Swallowing Evaluation: Modified Barium Swallowing Study  Patient Details  Name: Kelli [name] MRN: [id number] Date of Birth: [birth year]  Today's Date: 05/03/2013 Time: 7711-6579 Time Calculation (min): 26 min  Past Medical History:  Past Medical History  Diagnosis Date  . Essential hypertension, benign   . Fibromyalgia   . Cerebral infarction Aug 2014    Bihemispheric watershed infarcts  . Mixed hyperlipidemia   . Borderline diabetes   . Urinary incontinence   . Carotid artery occlusion     Occluded RICA, status post left CEA  August 2014 - Dr. Donnetta Hutching  . CKD (chronic kidney disease) stage 3, GFR 30-59 ml/min   . 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 Urethral dilation  1980's  . Combined hysterectomy vaginal w/ mmk / a&p repair  1981  . Vaginal hysterectomy  1981    "partial" (10/04/2012)  . Endarterectomy Left 10/06/2012    Procedure: Carotid Endarterectomy with Finesse patch angioplasty;  Surgeon: [name], MD;  Location: [place];  Service: Vascular;  Laterality: Left;  . Loop recorder implant  04/16/13    MDT LinQ implanted for cryptogenic stroke  . [name] without cardioversion N/A 04/16/2013    Procedure: TRANSESOPHAGEAL ECHOCARDIOGRAM ([name]);  Surgeon: Josue Hector, MD;  Location: [facility] ENDOSCOPY;  Service: Cardiovascular;  Laterality: N/A;   HPI:  Kelli [name] is an 63 y.o. female a history of hypertension, hyperlipidemia, watershed cerebral infarctions, right ICA occlusion and left CEA, presenting with new onset vertigo with nausea and vomiting as well as numbness involving left side of face and left upper extremity and left upper extremity weakness. Onset of symptoms was at 2:30 PM today 04/08/2013. Patient has been taking aspirin 81 mg per day per chart review.  She presented to the emergency room at [facility] initially. She was  noted initially deemed a candidate for TPA. However, she became worse with increasing nausea and vomiting as well as vertigo and worsening of numbness and left-sided weakness. Speech also became somewhat slurred. CTA was recommended by [facility]ist [facility]Call neurologist. She was subsequently transferred here for MRI and MRA study to rule out basal artery thrombosis. MRI showed findings consistent with acute small left cerebellar ischemic infarction. There is no evidence of basilar artery occlusion or significant stenosis. No evidence of thrombus was seen. NIH stroke score on arrival to [facility] was 5. Patient had dark colored emesis which was guaiac positive. Patient was not administerd TPA.  Pt had speech evaluation yesterday and was noted to be coughing with intake, which pt states has occured since hospitalization.  Bedside evaluation completed yesterday and diet modified to puree/nectar.  Poor intake ongoing due to dysphagia, lethargy and displeasure with po diet.  Pt with worsening neuro symptoms and MRI reveales new, acute right PCA territory infarction.     Recommendation/Prognosis  Clinical Impression:   Dysphagia Diagnosis: Mild oral phase dysphagia;Mild pharyngeal phase dysphagia  Clinical Impression: Pt presents with a mild oropharyngeal sensorimotor based dysphagia with noted improvement from previous MBS 2/19. Pt continues to present with mild oral holding and decreased bolus cohesion, which when paired with impaired sensation results in premature spillage and delayed swallow initiation at the valleculae with solids and at the pyriform sinuses with thin liquids. Pt demonstrated the most efficient airway protection while taking small, controlled cup sips of thin liquid with a head  neutral position, with intermittent, trace penetration being easily cleared with a cued throat clear and re-swallow. Recommend to advance diet to Dys 3 textures and thin liquids by cup sips only with full supervision for use of  strategies.   ADDENDUM: SLP met with pt and husband later in the day to review updates and provide education. SLP emphasized the importance of using recommended strategies due to the risk of SILENT aspiration. Pt and her husband verbalized their understanding. Pt's husband had brought in additional food items for pt which were reviewed and approved by SLP.   Swallow Evaluation Recommendations:  Diet Recommendations: Dysphagia 3 (Mechanical Soft);Thin liquid Liquid Administration via: Cup;No straw Medication Administration: Whole meds with puree Supervision: Full supervision/cueing for compensatory strategies;Patient able to self feed Compensations: Slow rate;Small sips/bites;Check for pocketing;Clear throat intermittently Postural Changes and/or Swallow Maneuvers: Seated upright 90 degrees;Upright 30-60 min after meal Oral Care Recommendations: Oral care BID Follow up Recommendations: Skilled Nursing facility;24 hour supervision/assistance    Prognosis:  Prognosis for Safe Diet Advancement: Good Barriers to Reach Goals: Cognitive deficits;Behavior   Individuals Consulted: Consulted and Agree with Results and Recommendations: Patient;RN      SLP Assessment/Plan  Plan:  Speech Therapy Frequency: min 5x/week Potential to Achieve Goals: Good Potential Considerations: Cooperation/participation level;Ability to learn/carryover information    Short Term Goals: Week 3: SLP Short Term Goal 1 (Week 3): Pt will utilize safe swallowing strategies with current diet with supervision SLP Short Term Goal 2 (Week 3): Pt attend to her left visual field during basic functional task with Min cues SLP Short Term Goal 3 (Week 3): Pt will sustain attention to basic functional task for 15 minutes with Min cues SLP Short Term Goal 4 (Week 3): Pt will utilize environmental cues to demonstrate orientation x4 with Mod cues SLP Short Term Goal 5 (Week 3): Pt will demonstrate adequate problem solving during  basic functional task with Min cues SLP Short Term Goal 6 (Week 3): Pt will demonstrate effective mastication and oral clearance with Dys 3 textures with Min cues    General: Date of Onset: 04/08/13 Type of Study: Modified Barium Swallowing Study Reason for Referral: Objectively evaluate swallowing function Previous Swallow Assessment: MBS 2/19 with silent aspiration of thin liquids; recommendation for Dys 2 and nectar thick liquids Diet Prior to this Study: Dysphagia 2 (chopped);Nectar-thick liquids;Other (Comment) (ice chips per water protocol) Temperature Spikes Noted: No Respiratory Status: Room air History of Recent Intubation: No Behavior/Cognition: Alert;Cooperative;Distractible;Requires cueing Oral Cavity - Dentition: Adequate natural dentition Oral Motor / Sensory Function: Impaired - see Bedside swallow eval Self-Feeding Abilities: Able to feed self Patient Positioning: Upright in chair Baseline Vocal Quality: Clear Volitional Cough: Strong Volitional Swallow: Able to elicit Anatomy: Within functional limits Pharyngeal Secretions: Not observed secondary MBS   Reason for Referral:   Objectively evaluate swallowing function    Oral Phase: Oral Preparation/Oral Phase Oral Phase: Impaired Oral - Nectar Oral - Nectar Cup: Not tested Oral - Thin Oral - Thin Teaspoon: Holding of bolus Oral - Thin Cup: Holding of bolus Oral - Thin Straw: Holding of bolus Oral - Solids Oral - Puree: Holding of bolus;Reduced posterior propulsion;Lingual/palatal residue Oral - Mechanical Soft: Holding of bolus;Reduced posterior propulsion;Lingual/palatal residue (mildly decreased bolus cohesion)   Pharyngeal Phase:  Pharyngeal Phase Pharyngeal Phase: Impaired Pharyngeal - Nectar Pharyngeal - Nectar Cup: Not tested Pharyngeal - Thin Pharyngeal - Thin Teaspoon: Delayed swallow initiation;Premature spillage to pyriform sinuses;Penetration/Aspiration before swallow;Compensatory strategies  attempted (Comment) (chin tuck effective at increasing  airway protection although not preventing penetration) Penetration/Aspiration details (thin teaspoon): Material enters airway, passes BELOW cords without attempt by patient to eject out (silent aspiration) Pharyngeal - Thin Cup: Delayed swallow initiation;Premature spillage to pyriform sinuses;Penetration/Aspiration before swallow;Compensatory strategies attempted (Comment) (chin tuck not effective at increasing airway protection) Penetration/Aspiration details (thin cup): Material enters airway, CONTACTS cords and not ejected out Pharyngeal - Thin Straw: Delayed swallow initiation;Premature spillage to pyriform sinuses;Penetration/Aspiration before swallow Penetration/Aspiration details (thin straw): Material enters airway, passes BELOW cords without attempt by patient to eject out (silent aspiration) Pharyngeal - Solids Pharyngeal - Puree: Delayed swallow initiation;Premature spillage to valleculae Pharyngeal - Mechanical Soft: Delayed swallow initiation   Cervical Esophageal Phase  Cervical Esophageal Phase Cervical Esophageal Phase: [facility]     Germain Osgood, M.A. CCC-SLP [phone]  Germain Osgood 05/03/2013, 10:15 AM

## 2013-05-03 NOTE — Progress Notes (Signed)
Subjective/Complaints: 63 y.o. 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 She was admitted on 04/08/2013 with dizziness, N/V as well as left-sided numbness and weakness.   Better nights sleep ROS: Negative Objective: Vital Signs: Blood pressure 168/86, pulse 80, temperature 98.4 F (36.9 C), temperature source Oral, resp. rate 17, weight 87.408 kg (192 lb 11.2 oz), SpO2 97.00%. Ct Angio Chest Aortic Dissect W &/or W/o  04/17/2013   CLINICAL DATA:  Extensive atheromatous plaque in the thoracic aorta. Evaluate for potential penetrating aortic ulcer.  EXAM: CT ANGIOGRAPHY CHEST WITH CONTRAST  TECHNIQUE: Multidetector CT imaging of the chest was performed using the standard protocol during bolus administration of intravenous contrast. Multiplanar CT image reconstructions and MIPs were obtained to evaluate the vascular anatomy.  CONTRAST:  85m OMNIPAQUE IOHEXOL 350 MG/ML SOLN  COMPARISON:  No priors.  FINDINGS: Mediastinum: Precontrast images demonstrate no crescentic high attenuation associated with the wall of the thoracic aorta to suggest acute intramural hemorrhage at this time. Post-contrast images demonstrate extensive atheromatous plaque throughout the distal aortic arch and descending thoracic aorta. Some of these aortic plaques are ulcerated, best demonstrated on image 81 of series 5 in the distal descending thoracic aorta, but there is no frank penetrating aortic ulcer or signs of dissection at this time. Atheromatous plaque in the great vessels of the mediastinum is also noted, including either high-grade stenosis or complete occlusion of the proximal left subclavian artery. Flow on the distal left subclavian artery is noted, either related to incomplete obstruction, or collateral pathways. Heart size is borderline enlarged. There is no  significant pericardial fluid, thickening or pericardial calcification. Calcifications of the mitral annulus. Small hiatal hernia. No pathologically enlarged mediastinal or hilar lymph nodes.  Lungs/Pleura: Linear opacities in the left lower lobe likely reflect chronic scarring. No acute consolidative airspace disease. No pleural effusions. No definite suspicious appearing pulmonary nodules or masses are identified.  Upper Abdomen: Unremarkable.  Musculoskeletal: There are no aggressive appearing lytic or blastic lesions noted in the visualized portions of the skeleton.  Review of the MIP images confirms the above findings.  IMPRESSION: 1. Extensive atheromatous plaque in the thoracic aorta and great vessels of the mediastinum. Although there are several ulcerated atheromatous plaques, there is no evidence of frank penetrating aortic ulcer or thoracic aortic dissection at this time. 2. There is, however, either high-grade stenosis or complete occlusion of the proximal left subclavian artery, with distal flow in the left subclavian artery related to either incomplete occlusion or collateralization. 3. Small hiatal hernia.   Electronically Signed   By: DVinnie LangtonM.D.   On: 04/17/2013 16:32   Results for orders placed during the hospital encounter of 04/17/13 (from the past 72 hour(s))  GLUCOSE, CAPILLARY     Status: Abnormal   Collection Time    04/30/13  4:17 PM      Result Value Ref Range   Glucose-Capillary 126 (*) 70 - 99 mg/dL  GLUCOSE, CAPILLARY     Status: Abnormal   Collection Time    04/30/13  8:38 PM      Result Value Ref Range   Glucose-Capillary 166 (*) 70 - 99 mg/dL  GLUCOSE, CAPILLARY     Status: Abnormal   Collection Time    05/01/13  7:18 AM      Result Value Ref Range   Glucose-Capillary 133 (*) 70 - 99 mg/dL  Comment 1 Notify RN    COMPREHENSIVE METABOLIC PANEL     Status: Abnormal   Collection Time    05/01/13 10:55 AM      Result Value Ref Range   Sodium 140  137 -  147 mEq/L   Potassium 4.4  3.7 - 5.3 mEq/L   Chloride 104  96 - 112 mEq/L   CO2 21  19 - 32 mEq/L   Glucose, Bld 155 (*) 70 - 99 mg/dL   BUN 19  6 - 23 mg/dL   Creatinine, Ser 1.44 (*) 0.50 - 1.10 mg/dL   Calcium 9.4  8.4 - 10.5 mg/dL   Total Protein 7.0  6.0 - 8.3 g/dL   Albumin 3.4 (*) 3.5 - 5.2 g/dL   AST 16  0 - 37 U/L   ALT 18  0 - 35 U/L   Alkaline Phosphatase 77  39 - 117 U/L   Total Bilirubin 0.3  0.3 - 1.2 mg/dL   GFR calc non Af Amer 38 (*) >90 mL/min   GFR calc Af Amer 44 (*) >90 mL/min   Comment: (NOTE)     The eGFR has been calculated using the CKD EPI equation.     This calculation has not been validated in all clinical situations.     eGFR's persistently <90 mL/min signify possible Chronic Kidney     Disease.  GLUCOSE, CAPILLARY     Status: Abnormal   Collection Time    05/01/13 11:18 AM      Result Value Ref Range   Glucose-Capillary 148 (*) 70 - 99 mg/dL   Comment 1 Notify RN    GLUCOSE, CAPILLARY     Status: Abnormal   Collection Time    05/01/13  4:46 PM      Result Value Ref Range   Glucose-Capillary 107 (*) 70 - 99 mg/dL  GLUCOSE, CAPILLARY     Status: Abnormal   Collection Time    05/01/13  8:35 PM      Result Value Ref Range   Glucose-Capillary 163 (*) 70 - 99 mg/dL  GLUCOSE, CAPILLARY     Status: Abnormal   Collection Time    05/02/13  7:19 AM      Result Value Ref Range   Glucose-Capillary 129 (*) 70 - 99 mg/dL   Comment 1 Notify RN    GLUCOSE, CAPILLARY     Status: Abnormal   Collection Time    05/02/13  1:05 PM      Result Value Ref Range   Glucose-Capillary 146 (*) 70 - 99 mg/dL   Comment 1 Notify RN    GLUCOSE, CAPILLARY     Status: Abnormal   Collection Time    05/02/13  4:05 PM      Result Value Ref Range   Glucose-Capillary 121 (*) 70 - 99 mg/dL  URINALYSIS, ROUTINE W REFLEX MICROSCOPIC     Status: Abnormal   Collection Time    05/02/13  6:33 PM      Result Value Ref Range   Color, Urine YELLOW  YELLOW   APPearance HAZY (*)  CLEAR   Specific Gravity, Urine 1.020  1.005 - 1.030   pH 5.5  5.0 - 8.0   Glucose, UA NEGATIVE  NEGATIVE mg/dL   Hgb urine dipstick NEGATIVE  NEGATIVE   Bilirubin Urine NEGATIVE  NEGATIVE   Ketones, ur NEGATIVE  NEGATIVE mg/dL   Protein, ur 100 (*) NEGATIVE mg/dL   Urobilinogen, UA 0.2  0.0 - 1.0  mg/dL   Nitrite POSITIVE (*) NEGATIVE   Leukocytes, UA MODERATE (*) NEGATIVE  URINE MICROSCOPIC-ADD ON     Status: Abnormal   Collection Time    05/02/13  6:33 PM      Result Value Ref Range   Squamous Epithelial / LPF FEW (*) RARE   WBC, UA TOO NUMEROUS TO COUNT  <3 WBC/hpf   Bacteria, UA MANY (*) RARE  GLUCOSE, CAPILLARY     Status: Abnormal   Collection Time    05/02/13  8:34 PM      Result Value Ref Range   Glucose-Capillary 142 (*) 70 - 99 mg/dL   Comment 1 Notify RN    GLUCOSE, CAPILLARY     Status: Abnormal   Collection Time    05/03/13  7:31 AM      Result Value Ref Range   Glucose-Capillary 134 (*) 70 - 99 mg/dL   Comment 1 Notify RN    GLUCOSE, CAPILLARY     Status: Abnormal   Collection Time    05/03/13 11:13 AM      Result Value Ref Range   Glucose-Capillary 137 (*) 70 - 99 mg/dL   Comment 1 Notify RN        General: No acute distress Mood and affect are appropriate Heart: Regular rate and rhythm no rubs murmurs or extra sounds Lungs: Clear to auscultation, breathing unlabored, no rales or wheezes Abdomen: Positive bowel sounds, soft nontender to palpation, nondistended Extremities: No clubbing, cyanosis, or edema Skin: No evidence of breakdown, no evidence of rash Neurologic: Cranial nerves II through XII intact, motor strength is 5/5 in Right deltoid, bicep, tricep, grip, hip flexor, knee extensors, ankle dorsiflexor and plantar flexor 0/5 on left side except trace Left Hip add,2- KE Sensory exam normal sensation to light touch and proprioception in right upper and lower extremities, feels deep pressure on left side Cerebellar exam cannot assess on left  secondary to weakness Musculoskeletal: Pain with Left shouldr ROM, + Sublux Right gaze preference.   Assessment/Plan: 1. Functional deficits secondary to Right PCA infarct with Left HP, L HH which require 3+ hours per day of interdisciplinary therapy in a comprehensive inpatient rehab setting. Physiatrist is providing close team supervision and 24 hour management of active medical problems listed below. Physiatrist and rehab team continue to assess barriers to discharge/monitor patient progress toward functional and medical goals. Team conference today please see physician documentation under team conference tab, met with team face-to-face to discuss problems,progress, and goals. Formulized individual treatment plan based on medical history, underlying problem and comorbidities. FIM: FIM - Bathing Bathing Steps Patient Completed: Chest;Abdomen;Front perineal area;Left Arm;Right upper leg;Left upper leg Bathing: 3: Mod-Patient completes 5-7 [id number] parts or 50-74%  FIM - Upper Body Dressing/Undressing Upper body dressing/undressing steps patient completed: Put head through opening of pull over shirt/dress;Thread/unthread right sleeve of pullover shirt/dresss Upper body dressing/undressing: 3: Mod-Patient completed 50-74% of tasks FIM - Lower Body Dressing/Undressing Lower body dressing/undressing steps patient completed: Thread/unthread right pants leg;Thread/unthread left underwear leg Lower body dressing/undressing: 2: Max-Patient completed 25-49% of tasks  FIM - Toileting Toileting steps completed by patient: Adjust clothing prior to toileting;Adjust clothing after toileting (no hygiene required) Toileting Assistive Devices: Grab bar or rail for support Toileting: 1: Total-Patient completed zero steps, helper did all 3  FIM - TRadio producerDevices: Grab bars Toilet Transfers: 3-To toilet/BSC: Mod A (lift or lower assist);2-From toilet/BSC: Max A (lift and  lower assist)  FIM - Bed/Chair  Financial planner Devices: Arm rests;Bed rails;HOB elevated (HOB elevated to 25 degrees) Bed/Chair Transfer: 4: Supine > Sit: Min A (steadying Pt. > 75%/lift 1 leg);4: Bed > Chair or W/C: Min A (steadying Pt. > 75%);3: Chair or W/C > Bed: Mod A (lift or lower assist)  FIM - Locomotion: Wheelchair Distance: 75 Locomotion: Wheelchair: 1: Total Assistance/staff pushes wheelchair (Pt<25%) FIM - Locomotion: Ambulation Locomotion: Ambulation Assistive Devices:  (R arm rail) Ambulation/Gait Assistance: 1: +2 Total assist (mod A and + 2 assist for follow with the wheel chair) Locomotion: Ambulation: 0: Activity did not occur  Comprehension Comprehension Mode: Auditory Comprehension: 5-Understands basic 90% of the time/requires cueing < 10% of the time  Expression Expression Mode: Verbal Expression: 5-Expresses basic needs/ideas: With extra time/assistive device  Social Interaction Social Interaction: 4-Interacts appropriately 75 - 89% of the time - Needs redirection for appropriate language or to initiate interaction.  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 DVT Prophylaxis/Anticoagulation: SCD's, sq Lovenox. No bleeding episodes 2. Fibromyalgia/Lleft shoulder pain due to subluxation/ Pain Management:no pain c/os today                                           3. Anxiety disorder/ Mood: Poor awareness with apathy noted. Continue Klonopin as needed. LCSW to follow for support and evaluation. Provide emotional  support 4. Neuropsych: This patient is not capable of making decisions on her own behalf.  5. HTN: will monitor with bid checks. Continue catapress, atenolol, norvasc and hydralazine. Generally improved. Check orthostatics given dizziness c/o, parameters for BB 6. Borderline diabetes: Hgb A1c-6.3/MFG-134. CM diet restrictions. SSI for elevated BS---most thickened liquids are sweetened.  7: Dyslipidemia: Continue zocor  8. CKD stage 3:  serial checks.  9. Dysphagia: continue D2,nectar liquids with aspiration precautions. IVF at bedtime to maintain adequate hydration. Patient/family non-compliant--continue to educate on aspiration risk.  10. Constipation: Continue miralax and senna. No nausea vomiting 11. Hyperactive bladder:secondary to CVA. Ditropan 5 mg each bedtime 12.  Left shoulder pain- subluxation, may also have adhesive capsulitis trial FES 13.  Nausea check meds, CMET,  14.  SOB- exam unremarkable no resp distress enc deep breathing stiop ritalin as this may cause anxiety LOS (Days) 16  A FACE TO FACE EVALUATION WAS PERFORMED  ANGIULLI,DANIEL J. 05/03/2013, 2:25 PM

## 2013-05-03 NOTE — Plan of Care (Signed)
Problem: RH Car Transfers Goal: LTG Patient will perform car transfers with assist (PT) LTG: Patient will perform car transfers with assistance (PT).  Outcome: Not Applicable Date Met:  76/19/50 D/C'ed due to slow pt progress.

## 2013-05-03 NOTE — Progress Notes (Addendum)
Physical Therapy Weekly Progress Note  Patient Details  Name: Kelli [name] MRN: [id number] Date of Birth: 17-Apr-1950  Today's Date: 05/03/2013 Time: 1112-1209 and [id number] Time Calculation (min): 57 min and 49 min  Patient has met 1 of 5 short term goals. Patient progress toward short term goals has been limited by decreased attention during therapy sessions.  Patient continues to demonstrate the following deficits: decreased sustained attention, L hemiplegia, L-sided inattention, limited postural stability in sitting/standing, decreased stability/independencre with functional transfers, and therefore will continue to benefit from skilled PT intervention to enhance overall performance with activity tolerance, balance, postural control, ability to compensate for deficits, functional use of  left upper extremity and left lower extremity, attention and awareness.  Long term goals modified on 05/02/13 secondary to slow pt progress, change in planned discharge destination. Due to recent goal revision, pt is now progressing toward long term goals..  Continue plan of care. Addendum: Downgraded long term goals addressing w/c mobility in controlled and home environments secondary to slow pt progress.  PT Short Term Goals Week 2:  PT Short Term Goal 1 (Week 2): Pt to consistently perform supine<>sit with min A with HOB flat without rail. PT Short Term Goal 1 - Progress (Week 2): Progressing toward goal PT Short Term Goal 2 (Week 2): Pt to consistently perform bed<>chair transfer with min A. PT Short Term Goal 2 - Progress (Week 2): Progressing toward goal PT Short Term Goal 3 (Week 2): Pt to perform w/c mobility x50' in controlled environment with min A and min cueing for L-sided attention. PT Short Term Goal 3 - Progress (Week 2): Progressing toward goal PT Short Term Goal 4 (Week 2): Pt to perform sit<>stand with mod A using w/c arm rest. PT Short Term Goal 4 - Progress (Week 2): Met PT Short Term Goal  5 (Week 2): Pt to perform gait x15' in controlled encironment with +1Total A. PT Short Term Goal 5 - Progress (Week 2): Not met Week 3:  PT Short Term Goal 1 (Week 3): STG's=LTG's secondary to ELOS  Skilled Therapeutic Interventions/Progress Updates:    Treatment Session 1: Pt received semi-reclined in bed; agreeable to session. Pt reports no pain. Left-sided inattention appears more prominent today as compared with previous sessions. Pt oriented x3 to self, time, and situation but states she is "in the basement." RN notified of changes in pt presentation.  Session focused on increasing pt stability/independence with functional transfers (focus on furniture transfers). Performed supine>sit with min A (to stabilize trunk once seated) using bed rail with HOB elevated to 25 degrees. Scooting to EOB with min guard. Donned GivMorh sling to LUE.  Performed squat pivot transfer from bed>w/c (to R side) with min A; pt able to effectively instruct this PT of 90% of transfer setup without cueing. Transported pt to rehab apartment to decrease distractions, prolong sustained attention to functional transfers. Performed squat pivot transfers from w/c<>couch (to both R and L side) x3 trials, requiring mod A for initial 2 trials, min A for final trial. Quiet environment effective in improving sustained attention, maximizing within-session carryover of cueing with transfers. Transported pt back to room in w/c. Doffed GivMohr sling. Therapist departed with pt seated in w/c with quick release belt on for safety, all needs within reach.  Treatment Session 2: Pt received seated in w/c with family present. Family departed, in family room for session. Pt agreeable to session. Immediately after session began, pt reports urgent need to have BM. Therapist transported pt  to toilet in w/c. Performed stand pivot transfer from w/c>toilet with mod A, grab bars; max verbal cueing required to safely complete transfer secondary to pt  anxiety.  Incontinent BM noted; RN present to assist with peri care, changing pt clothing. Pt seated on toilet x15 minutes with RUE support at grab bar, supervision; intermittent cueing to remind pt to maintain RUE at grab bar. Performed multiple sit<>stand transfers from w/c with RUE support at grab bar with min A. Multiple static standing trials x45 seconds-3 minutes with min-max A while RN performing peri care. Performed sit<>stand x2 from w/c with RUE support at sink with min A. Static standing with RUE support at sink  while donning brief and pants, min guard for dynamic standing balance. No episodes of L knee buckling noted during all static standing trials. Therapist departed with pt seated in w/c with quick release belt on for safety, family members present, and all needs within reach.  Therapy Documentation Precautions:  Precautions Precautions: Fall Precaution Comments: Lt field deficit and Lt inattention; LUE subluxation  Restrictions Weight Bearing Restrictions: No Pain:  No pain reported during AM/PM sessions. See FIM for current functional status  Therapy/Group: Individual Therapy  Hobble, Malva Cogan 05/03/2013, 12:30 PM

## 2013-05-04 ENCOUNTER — Inpatient Hospital Stay (HOSPITAL_COMMUNITY): Payer: 59

## 2013-05-04 ENCOUNTER — Inpatient Hospital Stay (HOSPITAL_COMMUNITY): Payer: 59 | Admitting: Physical Therapy

## 2013-05-04 DIAGNOSIS — N183 Chronic kidney disease, stage 3 unspecified: Secondary | ICD-10-CM

## 2013-05-04 DIAGNOSIS — I10 Essential (primary) hypertension: Secondary | ICD-10-CM

## 2013-05-04 DIAGNOSIS — I69991 Dysphagia following unspecified cerebrovascular disease: Secondary | ICD-10-CM

## 2013-05-04 DIAGNOSIS — I6330 Cerebral infarction due to thrombosis of unspecified cerebral artery: Secondary | ICD-10-CM

## 2013-05-04 DIAGNOSIS — F411 Generalized anxiety disorder: Secondary | ICD-10-CM

## 2013-05-04 LAB — URINALYSIS, ROUTINE W REFLEX MICROSCOPIC
BILIRUBIN URINE: NEGATIVE
GLUCOSE, UA: NEGATIVE mg/dL
Ketones, ur: NEGATIVE mg/dL
Nitrite: POSITIVE — AB
PH: 5.5 (ref 5.0–8.0)
Protein, ur: 100 mg/dL — AB
SPECIFIC GRAVITY, URINE: 1.022 (ref 1.005–1.030)
Urobilinogen, UA: 0.2 mg/dL (ref 0.0–1.0)

## 2013-05-04 LAB — GLUCOSE, CAPILLARY
GLUCOSE-CAPILLARY: 115 mg/dL — AB (ref 70–99)
GLUCOSE-CAPILLARY: 133 mg/dL — AB (ref 70–99)
Glucose-Capillary: 127 mg/dL — ABNORMAL HIGH (ref 70–99)
Glucose-Capillary: 129 mg/dL — ABNORMAL HIGH (ref 70–99)

## 2013-05-04 LAB — URINE MICROSCOPIC-ADD ON

## 2013-05-04 MED ORDER — HYDRALAZINE HCL 25 MG PO TABS
25.0000 mg | ORAL_TABLET | Freq: Once | ORAL | Status: AC
  Administered 2013-05-04: 25 mg via ORAL

## 2013-05-04 NOTE — Progress Notes (Signed)
Subjective/Complaints: 63 y.o. 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 She was admitted on 04/08/2013 with dizziness, N/V as well as left-sided numbness and weakness.   Slept poorly, had dizziness in therapy this am ROS:  Constipation, bladder working ok, weakness Objective: Vital Signs: Blood pressure 156/60, pulse 76, temperature 98.3 F (36.8 C), temperature source Oral, resp. rate 18, weight 87.408 kg (192 lb 11.2 oz), SpO2 96.00%.   Results for orders placed during the hospital encounter of 04/17/13 (from the past 72 hour(s))  GLUCOSE, CAPILLARY     Status: Abnormal   Collection Time    05/01/13  4:46 PM      Result Value Ref Range   Glucose-Capillary 107 (*) 70 - 99 mg/dL  GLUCOSE, CAPILLARY     Status: Abnormal   Collection Time    05/01/13  8:35 PM      Result Value Ref Range   Glucose-Capillary 163 (*) 70 - 99 mg/dL  GLUCOSE, CAPILLARY     Status: Abnormal   Collection Time    05/02/13  7:19 AM      Result Value Ref Range   Glucose-Capillary 129 (*) 70 - 99 mg/dL   Comment 1 Notify RN    GLUCOSE, CAPILLARY     Status: Abnormal   Collection Time    05/02/13  1:05 PM      Result Value Ref Range   Glucose-Capillary 146 (*) 70 - 99 mg/dL   Comment 1 Notify RN    GLUCOSE, CAPILLARY     Status: Abnormal   Collection Time    05/02/13  4:05 PM      Result Value Ref Range   Glucose-Capillary 121 (*) 70 - 99 mg/dL  URINALYSIS, ROUTINE W REFLEX MICROSCOPIC     Status: Abnormal   Collection Time    05/02/13  6:33 PM      Result Value Ref Range   Color, Urine YELLOW  YELLOW   APPearance HAZY (*) CLEAR   Specific Gravity, Urine 1.020  1.005 - 1.030   pH 5.5  5.0 - 8.0   Glucose, UA NEGATIVE  NEGATIVE mg/dL   Hgb urine dipstick NEGATIVE  NEGATIVE   Bilirubin Urine NEGATIVE  NEGATIVE   Ketones, ur NEGATIVE   NEGATIVE mg/dL   Protein, ur 100 (*) NEGATIVE mg/dL   Urobilinogen, UA 0.2  0.0 - 1.0 mg/dL   Nitrite POSITIVE (*) NEGATIVE   Leukocytes, UA MODERATE (*) NEGATIVE  URINE MICROSCOPIC-ADD ON     Status: Abnormal   Collection Time    05/02/13  6:33 PM      Result Value Ref Range   Squamous Epithelial / LPF FEW (*) RARE   WBC, UA TOO NUMEROUS TO COUNT  <3 WBC/hpf   Bacteria, UA MANY (*) RARE  GLUCOSE, CAPILLARY     Status: Abnormal   Collection Time    05/02/13  8:34 PM      Result Value Ref Range   Glucose-Capillary 142 (*) 70 - 99 mg/dL   Comment 1 Notify RN    GLUCOSE, CAPILLARY     Status: Abnormal   Collection Time    05/03/13  7:31 AM      Result Value Ref Range   Glucose-Capillary 134 (*) 70 - 99 mg/dL   Comment 1 Notify RN    GLUCOSE, CAPILLARY     Status: Abnormal  Collection Time    05/03/13 11:13 AM      Result Value Ref Range   Glucose-Capillary 137 (*) 70 - 99 mg/dL   Comment 1 Notify RN    GLUCOSE, CAPILLARY     Status: Abnormal   Collection Time    05/03/13  4:21 PM      Result Value Ref Range   Glucose-Capillary 131 (*) 70 - 99 mg/dL   Comment 1 STAT Lab     Comment 2 Notify RN    GLUCOSE, CAPILLARY     Status: Abnormal   Collection Time    05/03/13  9:06 PM      Result Value Ref Range   Glucose-Capillary 139 (*) 70 - 99 mg/dL  GLUCOSE, CAPILLARY     Status: Abnormal   Collection Time    05/04/13  7:36 AM      Result Value Ref Range   Glucose-Capillary 127 (*) 70 - 99 mg/dL  GLUCOSE, CAPILLARY     Status: Abnormal   Collection Time    05/04/13 11:26 AM      Result Value Ref Range   Glucose-Capillary 115 (*) 70 - 99 mg/dL      General: No acute distress Mood and affect are appropriate Heart: Regular rate and rhythm no rubs murmurs or extra sounds Lungs: Clear to auscultation, breathing unlabored, no rales or wheezes Abdomen: Positive bowel sounds, soft nontender to palpation, nondistended Extremities: No clubbing, cyanosis, or edema Skin: No  evidence of breakdown, no evidence of rash Neurologic: Cranial nerves II through XII intact, motor strength is 5/5 in Right deltoid, bicep, tricep, grip, hip flexor, knee extensors, ankle dorsiflexor and plantar flexor 0/5 on left side except trace Left Hip add,2- KE Sensory exam normal sensation to light touch and proprioception in right upper and lower extremities, feels deep pressure on left side Cerebellar exam cannot assess on left secondary to weakness Musculoskeletal: Pain with Left shouldr ROM, + Sublux Right gaze preference.   Assessment/Plan: 1. Functional deficits secondary to Right PCA infarct with Left HP, L HH which require 3+ hours per day of interdisciplinary therapy in a comprehensive inpatient rehab setting. Physiatrist is providing close team supervision and 24 hour management of active medical problems listed below. Physiatrist and rehab team continue to assess barriers to discharge/monitor patient progress toward functional and medical goals.  FIM: FIM - Bathing Bathing Steps Patient Completed: Chest;Abdomen;Front perineal area;Left Arm;Right upper leg;Left upper leg;Right lower leg (including foot) Bathing: 3: Mod-Patient completes 5-7 38f 10 parts or 50-74%  FIM - Upper Body Dressing/Undressing Upper body dressing/undressing steps patient completed: Put head through opening of pull over shirt/dress;Thread/unthread right sleeve of pullover shirt/dresss Upper body dressing/undressing: 3: Mod-Patient completed 50-74% of tasks FIM - Lower Body Dressing/Undressing Lower body dressing/undressing steps patient completed: Thread/unthread right pants leg Lower body dressing/undressing: 2: Max-Patient completed 25-49% of tasks  FIM - Toileting Toileting steps completed by patient: Performs perineal hygiene Toileting Assistive Devices: Grab bar or rail for support Toileting: 2: Max-Patient completed 1 of 3 steps  FIM - Radio producer Devices:  Grab bars Toilet Transfers: 3-To toilet/BSC: Mod A (lift or lower assist);3-From toilet/BSC: Mod A (lift or lower assist)  FIM - Bed/Chair Transfer Bed/Chair Transfer Assistive Devices: Arm rests Bed/Chair Transfer: 0: Activity did not occur  FIM - Locomotion: Wheelchair Distance: 2x50 Locomotion: Wheelchair: 2: Travels 50 - 149 ft with moderate assistance (Pt: 50 - 74%) FIM - Locomotion: Ambulation Locomotion: Ambulation Assistive Devices:  (R arm  rail) Ambulation/Gait Assistance: 1: +2 Total assist (mod A and + 2 assist for follow with the wheel chair) Locomotion: Ambulation: 0: Activity did not occur  Comprehension Comprehension Mode: Auditory Comprehension: 5-Understands basic 90% of the time/requires cueing < 10% of the time  Expression Expression Mode: Verbal Expression: 5-Expresses basic needs/ideas: With extra time/assistive device  Social Interaction Social Interaction: 4-Interacts appropriately 75 - 89% of the time - Needs redirection for appropriate language or to initiate interaction.  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 DVT Prophylaxis/Anticoagulation: SCD's, sq heparin.  2. Fibromyalgia/Lleft shoulder pain due to subluxation/ Pain Management:no pain c/os today                                           3. Anxiety disorder/ Mood: Poor awareness with apathy noted. Ritalin was not helpful LCSW to follow for support and evaluation.  4. Neuropsych: This patient is not capable of making decisions on her own behalf.  5. HTN: will monitor  with bid checks. Continue catapress, atenolol, norvasc and hydralazine. Generally improved. Check orthostatics given dizziness c/o, parameters for BB 6. Borderline diabetes: Hgb A1c-6.3/MFG-134. CM diet restrictions. SSI for elevated BS---most thickened liquids are sweetened.  7: Dyslipidemia: Continue zocor  8. CKD stage 3:  serial checks.  9. Dysphagia: continue D2,nectar liquids with aspiration precautions. IVF at bedtime to maintain adequate hydration. Patient/family non-compliant--continue to educate on aspiration risk.  10. Constipation: Continue miralax and senna.  84. Hyperactive bladder:secondary to CVA 12.  Left shoulder pain- subluxation, may also have adhesive capsulitis trial FES   LOS (Days) 17  A FACE TO FACE EVALUATION WAS PERFORMED  KIRSTEINS,ANDREW E 05/04/2013, 1:05 PM

## 2013-05-04 NOTE — Progress Notes (Signed)
Occupational Therapy Session Note  Patient Details  Name: Kelli [name] MRN: [id number] Date of Birth: April 12, 1950  Today's Date: 05/04/2013 Time: 0730-0830 and [id number] Time Calculation (min): 60 min and 33 min   Short Term Goals: Week 3:  OT Short Term Goal 1 (Week 3): Focus on LTGs  Skilled Therapeutic Interventions/Progress Updates:    Session 1: Pt seen for ADL retraining with focus on attention to L, functional transfers, sit<>stand, and attention to task. Pt received supine in bed eager for shower this AM. Completed stand pivot into shower with mod assist going to right side. Pt with supervision for sitting balance in shower and mod cues for locating bathing items on left side. HOH assist provided to LUE for washing stomach and RUE with trace active adduction noted. Pt required min assist for sit<>stand with heavy reliance on grab bars. Completed bathing from w/c level requiring mod assist for sit<>stand at sink and cues with assist for anterior weight shift and total assist for management of LUE. Pt completed toilet task at end of session requiring mod assist with use of grab bars and mod cues for technique and safety as pt slightly impulsive. At end of session pt left sitting in w/c with all needs in reach. Pt oriented x4 today and demonstrated much bright affect.   Session 2: Pt seen for 1:1 OT session with focus on toilet transfer and sit<>stand. Pt received sitting in w/c reporting needing to use bathroom immediately. Assisted pt to bathroom completing toilet transfer with mod assist going to right. Pt incontinent of BM d/t urgency. Pt finished BM with supervision for sitting balance on toilet. Required total assist for toilet task and min assist for standing balance with grab bar. Questioned pt on not using call light prior to therapist entering room and she reported "not sure if I needed to go." Encouraged pt to press call light whenever she feels any urgency to go. Therapist assisted with  changing clothing. Pt completed squat pivot transfer toilet>w/c (to left) with max assist. Pt discouraged by incontinent episode. Pt left sitting in w/c with all needs in reach.   Therapy Documentation Precautions:  Precautions Precautions: Fall Precaution Comments: Lt field deficit and Lt inattention; LUE subluxation  Restrictions Weight Bearing Restrictions: No General:   Vital Signs: Therapy Vitals Temp: 98.3 F (36.8 C) Temp src: Oral Pulse Rate: 75 Resp: 18 BP: 145/76 mmHg Patient Position, if appropriate: Lying Oxygen Therapy SpO2: 96 % O2 Device: None (Room air) Pain: Pt reports constant pain in L shoulder.  See FIM for current functional status  Therapy/Group: Individual Therapy  Duayne Cal 05/04/2013, 8:57 AM

## 2013-05-04 NOTE — Progress Notes (Signed)
Speech Language Pathology Daily Session Note  Patient Details  Name: Kelli [name] MRN: [id number] Date of Birth: December 30, 1950  Today's Date: 05/04/2013 Time: [id number] Time Calculation (min): 45 min  Short Term Goals: Week 3: SLP Short Term Goal 1 (Week 3): Pt will utilize safe swallowing strategies with current diet with supervision SLP Short Term Goal 2 (Week 3): Pt attend to her left visual field during basic functional task with Min cues SLP Short Term Goal 3 (Week 3): Pt will sustain attention to basic functional task for 15 minutes with Min cues SLP Short Term Goal 4 (Week 3): Pt will utilize environmental cues to demonstrate orientation x4 with Mod cues SLP Short Term Goal 5 (Week 3): Pt will demonstrate adequate problem solving during basic functional task with Min cues SLP Short Term Goal 6 (Week 3): Pt will demonstrate effective mastication and oral clearance with Dys 3 textures with Min cues  Skilled Therapeutic Interventions: Treatment focused on swallowing and cognitive goals. SLP facilitated session with observation of breakfast meal, consisting of DYs 3 textures and thin liquids. Pt required Min cues to utilize safe swallowing strategies, with cough observed x2 and wet vocal quality noted x1, which returned to baseline with a cued throat clear. During MBS, this was effective at clearing penetrates. Pt with increased PO intake with meal, verbalizing how happy she was to have additional choices. Pt sustained attention to self-feeding task for ~30 minutes with Mod cues for redirection, and required Mod cues to utilize the calendar to orient herself to the date. Continue plan of care.    FIM:  Comprehension Comprehension Mode: Auditory Comprehension: 5-Understands basic 90% of the time/requires cueing < 10% of the time Expression Expression Mode: Verbal Expression: 5-Expresses basic needs/ideas: With extra time/assistive device Social Interaction Social Interaction: 4-Interacts  appropriately 75 - 89% of the time - Needs redirection for appropriate language or to initiate interaction. Problem Solving Problem Solving: 3-Solves basic 50 - 74% of the time/requires cueing 25 - 49% of the time Memory Memory: 4-Recognizes or recalls 75 - 89% of the time/requires cueing 10 - 24% of the time FIM - Eating Eating Activity: 5: Needs verbal cues/supervision;5: Set-up assist for open containers  Pain Pain Assessment Pain Assessment: No/denies pain  Therapy/Group: Individual Therapy   Germain Osgood, M.A. CCC-SLP [phone]  Germain Osgood 05/04/2013, 10:39 AM

## 2013-05-04 NOTE — Progress Notes (Addendum)
Physical Therapy Session Note  Patient Details  Name: Kelli [name] MRN: [id number] Date of Birth: 05-27-1950  Today's Date: 05/04/2013 Time: 1000-1100  Time Calculation (min): 60 min  Short Term Goals: Week 3:  PT Short Term Goal 1 (Week 3): STG's=LTG's secondary to ELOS  Skilled Therapeutic Interventions/Progress Updates:    Pt received seated in w/c; agreeable to therapy. Pt perseverating on needing vision tested after D/C. Session therefore focused on increasing pt awareness of L visual field cut, increasing attention to L side with functional mobility.   Therapist verbally explained that CVA affected a portion of visual field on L side, rather than pt's visual acuity being affected. Therapist explained that compensatory strategies (such as turning head to L side) would enable pt to see L visual field and allow pt to safely perform w/c mobility. PT attempted to reinforce education by positioning greeting card in front of patient at midline. With head/neck in neutral position, pt able to read only the words to R of midline. Without moving card, pt cued to perform cervical spine rotation ~45 degrees to L side; pt able to read majority of wording on greeting card. Pt verbalized understanding but required reinforcement during mobility.  Performed w/c mobility x50' in controlled environment with R hemi technique, min A, mod verbal cueing for L-sided attention and obstacle negotiation. Pt then performed w/c mobility x50' in home (carpeted) environment with mod A, max multimodal cueing for L-sided attention, obstacle negotiation. Pt with increased frustration, stating she would like to utilize a motorized w/c for mobility at D/C. Therapist educated pt on importance of using manual w/c to prevent functional decline. Pt will require reinforcement. Therapist departed with pt seated in w/c with quick release belt on for safety and all needs within reach.   Addendum: Long term goal addressing bed<>chair  transfer downgraded to mod A secondary to slow pt progress.  Therapy Documentation Precautions:  Precautions Precautions: Fall Precaution Comments: Lt field deficit and Lt inattention; LUE subluxation  Restrictions Weight Bearing Restrictions: No Vital Signs: Therapy Vitals Pulse Rate: 76 BP: 156/60 mmHg Patient Position, if appropriate: Lying Pain: Pain Assessment Pain Assessment: No/denies pain Pain Score: 0-No pain Locomotion : Wheelchair Mobility Distance: 2x50   See FIM for current functional status  Therapy/Group: Individual Therapy  Cammie Faulstich, Malva Cogan 05/04/2013, 12:23 PM

## 2013-05-05 LAB — GLUCOSE, CAPILLARY
Glucose-Capillary: 123 mg/dL — ABNORMAL HIGH (ref 70–99)
Glucose-Capillary: 106 mg/dL — ABNORMAL HIGH (ref 70–99)
Glucose-Capillary: 112 mg/dL — ABNORMAL HIGH (ref 70–99)
Glucose-Capillary: 127 mg/dL — ABNORMAL HIGH (ref 70–99)

## 2013-05-05 MED ORDER — CEPHALEXIN 250 MG PO CAPS
250.0000 mg | ORAL_CAPSULE | Freq: Three times a day (TID) | ORAL | Status: DC
  Administered 2013-05-05 – 2013-05-08 (×9): 250 mg via ORAL
  Filled 2013-05-05 (×14): qty 1

## 2013-05-05 MED ORDER — GABAPENTIN 300 MG PO CAPS
300.0000 mg | ORAL_CAPSULE | Freq: Every day | ORAL | Status: DC
  Administered 2013-05-05 – 2013-05-07 (×3): 300 mg via ORAL
  Filled 2013-05-05 (×4): qty 1

## 2013-05-05 NOTE — Progress Notes (Signed)
Subjective/Complaints: 63 y.o. 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 She was admitted on 04/08/2013 with dizziness, N/V as well as left-sided numbness and weakness.   Slept poorly,burning pain L thigh and leg ROS:  Constipation, bladder working ok, weakness Objective: Vital Signs: Blood pressure 152/45, pulse 55, temperature 98.8 F (37.1 C), temperature source Oral, resp. rate 18, weight 87.408 kg (192 lb 11.2 oz), SpO2 97.00%.   Results for orders placed during the hospital encounter of 04/17/13 (from the past 72 hour(s))  GLUCOSE, CAPILLARY     Status: Abnormal   Collection Time    05/02/13  1:05 PM      Result Value Ref Range   Glucose-Capillary 146 (*) 70 - 99 mg/dL   Comment 1 Notify RN    GLUCOSE, CAPILLARY     Status: Abnormal   Collection Time    05/02/13  4:05 PM      Result Value Ref Range   Glucose-Capillary 121 (*) 70 - 99 mg/dL  URINALYSIS, ROUTINE W REFLEX MICROSCOPIC     Status: Abnormal   Collection Time    05/02/13  6:33 PM      Result Value Ref Range   Color, Urine YELLOW  YELLOW   APPearance HAZY (*) CLEAR   Specific Gravity, Urine 1.020  1.005 - 1.030   pH 5.5  5.0 - 8.0   Glucose, UA NEGATIVE  NEGATIVE mg/dL   Hgb urine dipstick NEGATIVE  NEGATIVE   Bilirubin Urine NEGATIVE  NEGATIVE   Ketones, ur NEGATIVE  NEGATIVE mg/dL   Protein, ur 100 (*) NEGATIVE mg/dL   Urobilinogen, UA 0.2  0.0 - 1.0 mg/dL   Nitrite POSITIVE (*) NEGATIVE   Leukocytes, UA MODERATE (*) NEGATIVE  URINE MICROSCOPIC-ADD ON     Status: Abnormal   Collection Time    05/02/13  6:33 PM      Result Value Ref Range   Squamous Epithelial / LPF FEW (*) RARE   WBC, UA TOO NUMEROUS TO COUNT  <3 WBC/hpf   Bacteria, UA MANY (*) RARE  GLUCOSE, CAPILLARY     Status: Abnormal   Collection Time    05/02/13  8:34 PM      Result  Value Ref Range   Glucose-Capillary 142 (*) 70 - 99 mg/dL   Comment 1 Notify RN    GLUCOSE, CAPILLARY     Status: Abnormal   Collection Time    05/03/13  7:31 AM      Result Value Ref Range   Glucose-Capillary 134 (*) 70 - 99 mg/dL   Comment 1 Notify RN    GLUCOSE, CAPILLARY     Status: Abnormal   Collection Time    05/03/13 11:13 AM      Result Value Ref Range   Glucose-Capillary 137 (*) 70 - 99 mg/dL   Comment 1 Notify RN    GLUCOSE, CAPILLARY     Status: Abnormal   Collection Time    05/03/13  4:21 PM      Result Value Ref Range   Glucose-Capillary 131 (*) 70 - 99 mg/dL   Comment 1 STAT Lab     Comment 2 Notify RN    GLUCOSE, CAPILLARY     Status: Abnormal   Collection Time    05/03/13  9:06 PM      Result Value Ref Range   Glucose-Capillary 139 (*) 70 -  99 mg/dL  GLUCOSE, CAPILLARY     Status: Abnormal   Collection Time    05/04/13  7:36 AM      Result Value Ref Range   Glucose-Capillary 127 (*) 70 - 99 mg/dL  GLUCOSE, CAPILLARY     Status: Abnormal   Collection Time    05/04/13 11:26 AM      Result Value Ref Range   Glucose-Capillary 115 (*) 70 - 99 mg/dL  GLUCOSE, CAPILLARY     Status: Abnormal   Collection Time    05/04/13  4:09 PM      Result Value Ref Range   Glucose-Capillary 129 (*) 70 - 99 mg/dL  URINALYSIS, ROUTINE W REFLEX MICROSCOPIC     Status: Abnormal   Collection Time    05/04/13  7:06 PM      Result Value Ref Range   Color, Urine YELLOW  YELLOW   APPearance TURBID (*) CLEAR   Specific Gravity, Urine 1.022  1.005 - 1.030   pH 5.5  5.0 - 8.0   Glucose, UA NEGATIVE  NEGATIVE mg/dL   Hgb urine dipstick TRACE (*) NEGATIVE   Bilirubin Urine NEGATIVE  NEGATIVE   Ketones, ur NEGATIVE  NEGATIVE mg/dL   Protein, ur 100 (*) NEGATIVE mg/dL   Urobilinogen, UA 0.2  0.0 - 1.0 mg/dL   Nitrite POSITIVE (*) NEGATIVE   Leukocytes, UA LARGE (*) NEGATIVE  URINE MICROSCOPIC-ADD ON     Status: Abnormal   Collection Time    05/04/13  7:06 PM      Result Value  Ref Range   Squamous Epithelial / LPF FEW (*) RARE   WBC, UA TOO NUMEROUS TO COUNT  <3 WBC/hpf   RBC / HPF 0-2  <3 RBC/hpf   Bacteria, UA MANY (*) RARE   Casts HYALINE CASTS (*) NEGATIVE  GLUCOSE, CAPILLARY     Status: Abnormal   Collection Time    05/04/13  9:04 PM      Result Value Ref Range   Glucose-Capillary 133 (*) 70 - 99 mg/dL   Comment 1 Notify RN        General: No acute distress Mood and affect are appropriate Heart: Regular rate and rhythm no rubs murmurs or extra sounds Lungs: Clear to auscultation, breathing unlabored, no rales or wheezes Abdomen: Positive bowel sounds, soft nontender to palpation, nondistended Extremities: No clubbing, cyanosis, or edema Skin: No evidence of breakdown, no evidence of rash Neurologic: Cranial nerves II through XII intact, motor strength is 5/5 in Right deltoid, bicep, tricep, grip, hip flexor, knee extensors, ankle dorsiflexor and plantar flexor 0/5 on left side except trace Left Hip add,2- KE Sensory exam normal sensation to light touch and proprioception in right upper and lower extremities, feels deep pressure on left side Cerebellar exam cannot assess on left secondary to weakness Musculoskeletal: Pain with Left shouldr ROM, + Sublux Right gaze preference.   Assessment/Plan: 1. Functional deficits secondary to Right PCA infarct with Left HP, L HH which require 3+ hours per day of interdisciplinary therapy in a comprehensive inpatient rehab setting. Physiatrist is providing close team supervision and 24 hour management of active medical problems listed below. Physiatrist and rehab team continue to assess barriers to discharge/monitor patient progress toward functional and medical goals.  FIM: FIM - Bathing Bathing Steps Patient Completed: Chest;Abdomen;Front perineal area;Left Arm;Right upper leg;Left upper leg;Right lower leg (including foot) Bathing: 3: Mod-Patient completes 5-7 7f 10 parts or 50-74%  FIM - Upper Body  Dressing/Undressing Upper body  dressing/undressing steps patient completed: Put head through opening of pull over shirt/dress;Thread/unthread right sleeve of pullover shirt/dresss Upper body dressing/undressing: 3: Mod-Patient completed 50-74% of tasks FIM - Lower Body Dressing/Undressing Lower body dressing/undressing steps patient completed: Thread/unthread right pants leg Lower body dressing/undressing: 2: Max-Patient completed 25-49% of tasks  FIM - Toileting Toileting steps completed by patient: Performs perineal hygiene Toileting Assistive Devices: Grab bar or rail for support Toileting: 1: Total-Patient completed zero steps, helper did all 3  FIM - Radio producer Devices: Grab bars Toilet Transfers: 3-To toilet/BSC: Mod A (lift or lower assist);2-From toilet/BSC: Max A (lift and lower assist)  FIM - Engineer, site Assistive Devices: Arm rests Bed/Chair Transfer: 0: Activity did not occur  FIM - Locomotion: Wheelchair Distance: 2x50 Locomotion: Wheelchair: 2: Travels 50 - 149 ft with moderate assistance (Pt: 50 - 74%) FIM - Locomotion: Ambulation Locomotion: Ambulation Assistive Devices:  (R arm rail) Ambulation/Gait Assistance: 1: +2 Total assist (mod A and + 2 assist for follow with the wheel chair) Locomotion: Ambulation: 0: Activity did not occur  Comprehension Comprehension Mode: Auditory Comprehension: 5-Understands complex 90% of the time/Cues < 10% of the time  Expression Expression Mode: Verbal Expression: 5-Expresses complex 90% of the time/cues < 10% of the time  Social Interaction Social Interaction: 4-Interacts appropriately 75 - 89% of the time - Needs redirection for appropriate language or to initiate interaction.  Problem Solving Problem Solving: 3-Solves basic 50 - 74% of the time/requires cueing 25 - 49% of the time  Memory Memory: 4-Recognizes or recalls 75 - 89% of the time/requires cueing 10 -  24% of the time  Medical Problem List and Plan:  left cerebellar infarct with extension and new R-PCA infarct affecting right occipital lobe, mesial and posterior right temporal lobe, mid brain and posterior right thalamus  -currently on ECASA and plavix for secondary stroke prevention  1. DVT Prophylaxis/Anticoagulation: SCD's, sq heparin.  2. Fibromyalgia/Lleft shoulder pain due to subluxation/ Pain Management:no pain c/os today  , dysesthetic pain, gabapentin                                         3. Anxiety disorder/ Mood: Poor awareness with apathy noted. Ritalin was not helpful LCSW to follow for support and evaluation.  4. Neuropsych: This patient is not capable of making decisions on her own behalf.  5. HTN: will monitor with bid checks. Continue catapress, atenolol, norvasc and hydralazine. Generally improved. Check orthostatics given dizziness c/o, parameters for BB 6. Borderline diabetes: Hgb A1c-6.3/MFG-134. CM diet restrictions. SSI for elevated BS---most thickened liquids are sweetened.  7: Dyslipidemia: Continue zocor  8. CKD stage 3:  serial checks.  9. Dysphagia: continue D2,nectar liquids with aspiration precautions. IVF at bedtime to maintain adequate hydration. Patient/family non-compliant--continue to educate on aspiration risk.  10. Constipation: Continue miralax and senna.  29. Hyperactive bladder:secondary to CVA, now with UTI, stop ditropan, start abx 12.  Left shoulder pain- subluxation, may also have adhesive capsulitis trial FES   LOS (Days) 18  A FACE TO FACE EVALUATION WAS PERFORMED  KIRSTEINS,ANDREW E 05/05/2013, 7:34 AM

## 2013-05-05 NOTE — Plan of Care (Signed)
Problem: RH BLADDER ELIMINATION Goal: RH STG MANAGE BLADDER WITH ASSISTANCE STG Manage Bladder With min Assistance  Outcome: Not Progressing Patient requiring I/O cath for unable to void

## 2013-05-06 ENCOUNTER — Inpatient Hospital Stay (HOSPITAL_COMMUNITY): Payer: 59 | Admitting: Physical Therapy

## 2013-05-06 LAB — GLUCOSE, CAPILLARY
GLUCOSE-CAPILLARY: 188 mg/dL — AB (ref 70–99)
GLUCOSE-CAPILLARY: 88 mg/dL (ref 70–99)
Glucose-Capillary: 112 mg/dL — ABNORMAL HIGH (ref 70–99)
Glucose-Capillary: 128 mg/dL — ABNORMAL HIGH (ref 70–99)
Glucose-Capillary: 237 mg/dL — ABNORMAL HIGH (ref 70–99)

## 2013-05-06 NOTE — Progress Notes (Signed)
Physical Therapy Session Note  Patient Details  Name: Kelli [name] MRN: [id number] Date of Birth: [birth year]  Today's Date: 05/06/2013 Time: 3825-0539 Time Calculation (min): 28 min  Short Term Goals: Week 1:  PT Short Term Goal 1 (Week 1): Pt to consistently perform supine<>sit with mod A with HOB flat using bed rail. PT Short Term Goal 1 - Progress (Week 1): Met PT Short Term Goal 2 (Week 1): Pt to consistently perform bed<>chair transfer with mod A. PT Short Term Goal 2 - Progress (Week 1): Met PT Short Term Goal 3 (Week 1): Pt to perform w/c mobility x50' in controlled environment with mod A. PT Short Term Goal 3 - Progress (Week 1): Met PT Short Term Goal 4 (Week 1): Pt to perform sit<>stand with mod A. PT Short Term Goal 4 - Progress (Week 1): Partly met PT Short Term Goal 5 (Week 1): Pt to perform gait x15' in controlled environment with +1Total A. PT Short Term Goal 5 - Progress (Week 1): Progressing toward goal  Skilled Therapeutic Interventions/Progress Updates:  Pt was seen bedside in the am. Pt transferred w/c to mat, mat to w/c x 2, stand pivot transfer with mod to max A and verbal cues for technique. Pt requires mod A when transferring to the R side and max A when transferring to the L side. Performed w/c mobility with R UE and LE about 50 feet with mod A and verbal cues.    Therapy Documentation Precautions:  Precautions Precautions: Fall Precaution Comments: Lt field deficit and Lt inattention; LUE subluxation  Restrictions Weight Bearing Restrictions: No General:   Pain: No c/o pain.   See FIM for current functional status  Therapy/Group: Individual Therapy  Dub Amis 05/06/2013, 12:35 PM

## 2013-05-06 NOTE — Progress Notes (Signed)
Subjective/Complaints: 63 y.o. 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 She was admitted on 04/08/2013 with dizziness, N/V as well as left-sided numbness and weakness.   Slept poorly,burning pain L thigh and leg ROS:  Constipation, bladder working ok, weakness Objective: Vital Signs: Blood pressure 163/50, pulse 51, temperature 99.1 F (37.3 C), temperature source Oral, resp. rate 16, height 5' 4.96" (1.65 m), weight 87.408 kg (192 lb 11.2 oz), SpO2 97.00%.   Results for orders placed during the hospital encounter of 04/17/13 (from the past 72 hour(s))  GLUCOSE, CAPILLARY     Status: Abnormal   Collection Time    05/03/13 11:13 AM      Result Value Ref Range   Glucose-Capillary 137 (*) 70 - 99 mg/dL   Comment 1 Notify RN    GLUCOSE, CAPILLARY     Status: Abnormal   Collection Time    05/03/13  4:21 PM      Result Value Ref Range   Glucose-Capillary 131 (*) 70 - 99 mg/dL   Comment 1 STAT Lab     Comment 2 Notify RN    GLUCOSE, CAPILLARY     Status: Abnormal   Collection Time    05/03/13  9:06 PM      Result Value Ref Range   Glucose-Capillary 139 (*) 70 - 99 mg/dL  GLUCOSE, CAPILLARY     Status: Abnormal   Collection Time    05/04/13  7:36 AM      Result Value Ref Range   Glucose-Capillary 127 (*) 70 - 99 mg/dL  GLUCOSE, CAPILLARY     Status: Abnormal   Collection Time    05/04/13 11:26 AM      Result Value Ref Range   Glucose-Capillary 115 (*) 70 - 99 mg/dL  GLUCOSE, CAPILLARY     Status: Abnormal   Collection Time    05/04/13  4:09 PM      Result Value Ref Range   Glucose-Capillary 129 (*) 70 - 99 mg/dL  URINALYSIS, ROUTINE W REFLEX MICROSCOPIC     Status: Abnormal   Collection Time    05/04/13  7:06 PM      Result Value Ref Range   Color, Urine YELLOW  YELLOW   APPearance TURBID (*) CLEAR   Specific  Gravity, Urine 1.022  1.005 - 1.030   pH 5.5  5.0 - 8.0   Glucose, UA NEGATIVE  NEGATIVE mg/dL   Hgb urine dipstick TRACE (*) NEGATIVE   Bilirubin Urine NEGATIVE  NEGATIVE   Ketones, ur NEGATIVE  NEGATIVE mg/dL   Protein, ur 100 (*) NEGATIVE mg/dL   Urobilinogen, UA 0.2  0.0 - 1.0 mg/dL   Nitrite POSITIVE (*) NEGATIVE   Leukocytes, UA LARGE (*) NEGATIVE  URINE MICROSCOPIC-ADD ON     Status: Abnormal   Collection Time    05/04/13  7:06 PM      Result Value Ref Range   Squamous Epithelial / LPF FEW (*) RARE   WBC, UA TOO NUMEROUS TO COUNT  <3 WBC/hpf   RBC / HPF 0-2  <3 RBC/hpf   Bacteria, UA MANY (*) RARE   Casts HYALINE CASTS (*) NEGATIVE  GLUCOSE, CAPILLARY     Status: Abnormal   Collection Time    05/04/13  9:04 PM      Result Value Ref Range   Glucose-Capillary 133 (*) 70 - 99 mg/dL  Comment 1 Notify RN    GLUCOSE, CAPILLARY     Status: Abnormal   Collection Time    05/05/13  7:29 AM      Result Value Ref Range   Glucose-Capillary 123 (*) 70 - 99 mg/dL   Comment 1 Notify RN    GLUCOSE, CAPILLARY     Status: Abnormal   Collection Time    05/05/13 11:32 AM      Result Value Ref Range   Glucose-Capillary 106 (*) 70 - 99 mg/dL   Comment 1 Notify RN    GLUCOSE, CAPILLARY     Status: Abnormal   Collection Time    05/05/13  4:41 PM      Result Value Ref Range   Glucose-Capillary 112 (*) 70 - 99 mg/dL   Comment 1 Notify RN    GLUCOSE, CAPILLARY     Status: Abnormal   Collection Time    05/05/13  8:59 PM      Result Value Ref Range   Glucose-Capillary 127 (*) 70 - 99 mg/dL   Comment 1 Notify RN    GLUCOSE, CAPILLARY     Status: Abnormal   Collection Time    05/06/13  6:08 AM      Result Value Ref Range   Glucose-Capillary 112 (*) 70 - 99 mg/dL   Comment 1 Notify RN        General: No acute distress Mood and affect are appropriate Heart: Regular rate and rhythm no rubs murmurs or extra sounds Lungs: Clear to auscultation, breathing unlabored, no rales or  wheezes Abdomen: Positive bowel sounds, soft nontender to palpation, nondistended Extremities: No clubbing, cyanosis, or edema Skin: No evidence of breakdown, no evidence of rash Neurologic: Cranial nerves II through XII intact, motor strength is 5/5 in Right deltoid, bicep, tricep, grip, hip flexor, knee extensors, ankle dorsiflexor and plantar flexor 0/5 on left side except trace Left Hip add,2- KE Sensory exam normal sensation to light touch and proprioception in right upper and lower extremities, feels deep pressure on left side Cerebellar exam cannot assess on left secondary to weakness Musculoskeletal: Pain with Left shouldr ROM, + Sublux Right gaze preference.   Assessment/Plan: 1. Functional deficits secondary to Right PCA infarct with Left HP, L HH which require 3+ hours per day of interdisciplinary therapy in a comprehensive inpatient rehab setting. Physiatrist is providing close team supervision and 24 hour management of active medical problems listed below. Physiatrist and rehab team continue to assess barriers to discharge/monitor patient progress toward functional and medical goals.  FIM: FIM - Bathing Bathing Steps Patient Completed: Chest;Abdomen;Front perineal area;Left Arm;Right upper leg;Left upper leg;Right lower leg (including foot) Bathing: 3: Mod-Patient completes 5-7 75f 10 parts or 50-74%  FIM - Upper Body Dressing/Undressing Upper body dressing/undressing steps patient completed: Put head through opening of pull over shirt/dress;Thread/unthread right sleeve of pullover shirt/dresss Upper body dressing/undressing: 3: Mod-Patient completed 50-74% of tasks FIM - Lower Body Dressing/Undressing Lower body dressing/undressing steps patient completed: Thread/unthread right pants leg Lower body dressing/undressing: 2: Max-Patient completed 25-49% of tasks  FIM - Toileting Toileting steps completed by patient: Performs perineal hygiene Toileting Assistive Devices: Grab  bar or rail for support Toileting: 1: Total-Patient completed zero steps, helper did all 3  FIM - Radio producer Devices: Grab bars Toilet Transfers: 3-To toilet/BSC: Mod A (lift or lower assist);2-From toilet/BSC: Max A (lift and lower assist)  FIM - Engineer, site Assistive Devices: Arm rests Bed/Chair Transfer: 0: Activity  did not occur  FIM - Locomotion: Wheelchair Distance: 2x50 Locomotion: Wheelchair: 2: Travels 50 - 149 ft with moderate assistance (Pt: 50 - 74%) FIM - Locomotion: Ambulation Locomotion: Ambulation Assistive Devices:  (R arm rail) Ambulation/Gait Assistance: 1: +2 Total assist (mod A and + 2 assist for follow with the wheel chair) Locomotion: Ambulation: 0: Activity did not occur  Comprehension Comprehension Mode: Auditory Comprehension: 5-Understands basic 90% of the time/requires cueing < 10% of the time  Expression Expression Mode: Verbal Expression: 5-Expresses basic needs/ideas: With no assist  Social Interaction Social Interaction: 4-Interacts appropriately 75 - 89% of the time - Needs redirection for appropriate language or to initiate interaction.  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 DVT Prophylaxis/Anticoagulation: SCD's, sq heparin.  2. Fibromyalgia/Lleft shoulder pain due to subluxation/ Pain Management:no pain c/os today  , dysesthetic pain, gabapentin                                         3. Anxiety disorder/ Mood: Poor awareness with apathy noted.  Ritalin was not helpful LCSW to follow for support and evaluation.  4. Neuropsych: This patient is not capable of making decisions on her own behalf.  5. HTN: will monitor with bid checks. Continue catapress, atenolol, norvasc and hydralazine. Generally improved. Check orthostatics given dizziness c/o, parameters for BB 6. Borderline diabetes: Hgb A1c-6.3/MFG-134. CM diet restrictions. SSI for elevated BS---most thickened liquids are sweetened.  7: Dyslipidemia: Continue zocor  8. CKD stage 3:  serial checks.  9. Dysphagia: continue D2,nectar liquids with aspiration precautions. IVF at bedtime to maintain adequate hydration. Patient/family non-compliant--continue to educate on aspiration risk.  10. Constipation: Continue miralax and senna.  73. Hyperactive bladder:secondary to CVA, now with UTI, stop ditropan, start abx, await cx 12.  Left shoulder pain- subluxation, may also have adhesive capsulitis trial FES   LOS (Days) 19  A FACE TO FACE EVALUATION WAS PERFORMED  KIRSTEINS,ANDREW E 05/06/2013, 8:41 AM

## 2013-05-07 ENCOUNTER — Inpatient Hospital Stay (HOSPITAL_COMMUNITY): Payer: 59

## 2013-05-07 ENCOUNTER — Inpatient Hospital Stay (HOSPITAL_COMMUNITY): Payer: 59 | Admitting: Physical Therapy

## 2013-05-07 DIAGNOSIS — B9620 Unspecified Escherichia coli [E. coli] as the cause of diseases classified elsewhere: Secondary | ICD-10-CM | POA: Diagnosis not present

## 2013-05-07 DIAGNOSIS — N390 Urinary tract infection, site not specified: Secondary | ICD-10-CM | POA: Diagnosis not present

## 2013-05-07 DIAGNOSIS — M25512 Pain in left shoulder: Secondary | ICD-10-CM | POA: Insufficient documentation

## 2013-05-07 LAB — BASIC METABOLIC PANEL
BUN: 18 mg/dL (ref 6–23)
CO2: 21 meq/L (ref 19–32)
Calcium: 9.4 mg/dL (ref 8.4–10.5)
Chloride: 103 mEq/L (ref 96–112)
Creatinine, Ser: 1.37 mg/dL — ABNORMAL HIGH (ref 0.50–1.10)
GFR calc Af Amer: 47 mL/min — ABNORMAL LOW (ref >90)
GFR calc non Af Amer: 40 mL/min — ABNORMAL LOW (ref >90)
Glucose, Bld: 118 mg/dL — ABNORMAL HIGH (ref 70–99)
POTASSIUM: 4.5 meq/L (ref 3.7–5.3)
Sodium: 138 mEq/L (ref 137–147)

## 2013-05-07 LAB — GLUCOSE, CAPILLARY
GLUCOSE-CAPILLARY: 132 mg/dL — AB (ref 70–99)
GLUCOSE-CAPILLARY: 102 mg/dL — AB (ref 70–99)
GLUCOSE-CAPILLARY: 158 mg/dL — AB (ref 70–99)
Glucose-Capillary: 117 mg/dL — ABNORMAL HIGH (ref 70–99)

## 2013-05-07 LAB — URINE CULTURE

## 2013-05-07 MED ORDER — HYDRALAZINE HCL 50 MG PO TABS: 50.0000 mg | ORAL_TABLET | Freq: Four times a day (QID) | ORAL | Status: DC

## 2013-05-07 MED ORDER — ALUM & MAG HYDROXIDE-SIMETH 200-200-20 MG/5ML PO SUSP: 30.0000 mL | ORAL | Status: DC | PRN

## 2013-05-07 MED ORDER — POLYETHYLENE GLYCOL 3350 17 G PO PACK: 17.0000 g | PACK | Freq: Two times a day (BID) | ORAL | Status: DC

## 2013-05-07 MED ORDER — ATENOLOL 25 MG PO TABS: 50.0000 mg | ORAL_TABLET | Freq: Two times a day (BID) | ORAL | Status: DC

## 2013-05-07 MED ORDER — CEPHALEXIN 250 MG PO CAPS: 250.0000 mg | ORAL_CAPSULE | Freq: Three times a day (TID) | ORAL | Status: DC

## 2013-05-07 MED ORDER — MEGESTROL ACETATE 400 MG/10ML PO SUSP: 400.0000 mg | Freq: Two times a day (BID) | ORAL | Status: DC

## 2013-05-07 MED ORDER — HYDROCODONE-ACETAMINOPHEN 5-325 MG PO TABS: 1.0000 | ORAL_TABLET | Freq: Four times a day (QID) | ORAL | Status: DC | PRN

## 2013-05-07 MED ORDER — INSULIN ASPART 100 UNIT/ML ~~LOC~~ SOLN: 0.0000 [IU] | Freq: Three times a day (TID) | SUBCUTANEOUS | Status: DC

## 2013-05-07 MED ORDER — CLOPIDOGREL BISULFATE 75 MG PO TABS: 75.0000 mg | ORAL_TABLET | Freq: Every day | ORAL | Status: DC

## 2013-05-07 MED ORDER — INSULIN ASPART 100 UNIT/ML ~~LOC~~ SOLN: 0.0000 [IU] | Freq: Every day | SUBCUTANEOUS | Status: DC

## 2013-05-07 MED ORDER — ACETAMINOPHEN 325 MG PO TABS: 325.0000 mg | ORAL_TABLET | ORAL | Status: DC | PRN

## 2013-05-07 MED ORDER — CLONAZEPAM 0.5 MG PO TABS: 0.2500 mg | ORAL_TABLET | Freq: Two times a day (BID) | ORAL | Status: DC | PRN

## 2013-05-07 MED ORDER — SENNOSIDES-DOCUSATE SODIUM 8.6-50 MG PO TABS: 2.0000 | ORAL_TABLET | Freq: Two times a day (BID) | ORAL | Status: DC

## 2013-05-07 MED ORDER — GABAPENTIN 300 MG PO CAPS: 300.0000 mg | ORAL_CAPSULE | Freq: Every day | ORAL | Status: DC

## 2013-05-07 MED ORDER — DICLOFENAC SODIUM 1 % TD GEL: 2.0000 g | Freq: Four times a day (QID) | TRANSDERMAL | Status: DC

## 2013-05-07 NOTE — Progress Notes (Signed)
Physical Therapy Session Note  Patient Details  Name: Kelli [name] MRN: [id number] Date of Birth: 14-Feb-1951  Today's Date: 05/07/2013 Time: 0900-0958 Time Calculation (min): 58 min  Short Term Goals: Week 3:  PT Short Term Goal 1 (Week 3): STG's=LTG's secondary to ELOS  Skilled Therapeutic Interventions/Progress Updates:    Co-treatment with rec therapist. Pt received seated in w/c; agreeable to session. Pt denies pain at this time. Session addressed pt direction of care with focus on functional transfers. Donned L GivMohr sling to prevent L shoulder subluxation, pain during functional transfers.   Pt transported in w/c to hospital solarium, where pt performed squat pivot transfer from w/c>outdoor seat (to R side) with min A; pt able to effective direct this PT in ~60% of transfer setup without cueing; required mod cueing for remainder of setup. Static sitting balance on dynamic surface (wicker chair with couch cushion) with bilat LE's supported, RUE support at chair arm x18 minutes with supervision and dual tasking (talking); no LOB. Pt's cervical/thoracic spine posture grossly at midline in sagittal plane, no cueing. During sitting balance trial, pt exhibits active cervical spine rotation to L side; no cueing required. Pt performed stand pivot transfer from outdoor seat>w/c with mod A; pt able to direct PT in >75% of safe/effective setup of transfer with min cueing and increased time.  Performed w/c mobility x55' in home environment with min A, tactile cueing at L knee to increase LLE weightbearing; mod multimodal cueing for L-sided attention, mod verbal cueing to readjust w/c position via reverse w/c propulsion. W/c mobility trial ended per pt request secondary to fatigue. Transported pt back to pt room in w/c secondary to fatigue.Therapists departed with pt seated in w/c with quick release belt on for safety and all needs within reach.  Therapy Documentation Precautions:   Precautions Precautions: Fall Precaution Comments: Lt field deficit and Lt inattention; LUE subluxation  Restrictions Weight Bearing Restrictions: No Vital Signs: Therapy Vitals Pulse Rate: 60 BP: 140/72 mmHg Pain: Pain Assessment Pain Assessment: No/denies pain Pain Score: 0-No pain Locomotion : Wheelchair Mobility Distance: 55   FIM for current functional status  Therapy/Group: Co-Treatment  Shainna Faux A 05/07/2013, 10:44 AM

## 2013-05-07 NOTE — Progress Notes (Signed)
Speech Language Pathology Discharge Summary & Final Treatment Note  Patient Details  Name: Kelli [name] MRN: [id number] Date of Birth: 1950-07-17  Today's Date: 05/07/2013 Time: 6712-4580 Time Calculation (min): 27 min  Skilled Therapeutic Interventions:  Treatment focused on education as well as cognitive and swallowing goals. SLP facilitated session with review of current level of function and recommendations. Pt demonstrated intellectual awareness with Mod I. Pt consumed thin liquids via cup sips with throat clear x1. SLP reviewed results of MBS with patient, who reported that she tried using a straw and that it seemed "okay." Pt participated in basic verbal problem solving with Mod cues to identify foods that would be appropriate on current diet texture. Pt's son arrived at the end of treatment and was provided with a brief review of current function and recommendations.   Patient has met 6 of 6 long term goals.  Patient to discharge at overall Mod level.  Reasons goals not met: N/A   Clinical Impression/Discharge Summary: Pt has met 6 out of 6 LTGs due to mild improvements in sustained attention, recall of new information, and basic problem solving, requiring Mod A overall for basic cognitive tasks. Pt has also advanced to Dys 3 textures and thin liquids with supervision level cueing. Education is ongoing with patient and family. Pt will benefit from continued SLP services to maximize functional independence and safety with swallowing.  Care Partner:  Caregiver Able to Provide Assistance: Yes (SNF)  Type of Caregiver Assistance: Cognitive  Recommendation:  Skilled Nursing facility;24 hour supervision/assistance  Rationale for SLP Follow Up: Maximize cognitive function and independence;Maximize swallowing safety;Reduce caregiver burden   Equipment:  N/A  Reasons for discharge: Treatment goals met;Discharged from hospital   Patient/Family Agrees with Progress Made and Goals Achieved:  Yes   See FIM for current functional status   Germain Osgood, M.A. CCC-SLP [phone]  Germain Osgood 05/07/2013, 5:19 PM

## 2013-05-07 NOTE — Progress Notes (Signed)
Occupational Therapy Session Note  Patient Details  Name: Kelli [name] MRN: [id number] Date of Birth: 11/04/1950  Today's Date: 05/07/2013 Time: 0730-0825 and 1130-1200 Time calculation (min): 55 min and 30 min     Short Term Goals: Week 3:  OT Short Term Goal 1 (Week 3): Focus on LTGs  Skilled Therapeutic Interventions/Progress Updates:    Session 1: Pt seen for ADL retraining with focus on attention to L, sit<>stand, sitting balance, and initiation. Pt received supine in bed asleep and slightly difficult to arouse. Pt very fatigued throughout session, requiring max cues for arousal. Pt reported being "tired" and was oriented x4. Completed bathing sitting EOB with min-max assist for dynamic sitting balance and supervision for static sitting balance. Placed self-care items on left side with min cues for head turn to locate secondary to field cut. HOH assist provided to LUE for washing RUE. Pt required max cues for initiation and sequencing of tasks partially d/t fatigue. Pt completed sit<>stand with mod assist and assisted with clothing management over R hip. Therapist provided assistance for positioning of LLE during LB dressing. At end of session pt completed squat pivot transfer bed>w/c. Pt left sitting in w/c with NT present for breakfast.   Session 2: Pt seen for 1:1 OT session with focus on visual scanning and functional transfers. Pt received sitting in w/c attempting to adjust postioning of LLE on foot rest secondary to discomfort. Pt seeking assist as her call light was on. Therapist assisted with positioning. Pt requesting to complete toilet task. Complete stand pivot transfer with mod assist and heavy reliance on grab bar. Pt with continent episode of bladder and able to complete hygiene while in sitting. Pt required min assist for standing balance at grab bar while therapist assisted with clothing management. Pt with supervision for sitting balance on toilet. Pt completed squat pivot  transfer toilet>w/c with mod assist. Engaged in visual scanning activity with cues for compensatory strategies as pt has left visual field cut. At end of session pt left sitting in w/c with all needs in reach.  Therapy Documentation Precautions:  Precautions Precautions: Fall Precaution Comments: Lt field deficit and Lt inattention; LUE subluxation  Restrictions Weight Bearing Restrictions: No General:   Vital Signs: Therapy Vitals Temp: 98.5 F (36.9 C) Temp src: Axillary Pulse Rate: 49 Resp: 18 BP: 157/65 mmHg Patient Position, if appropriate: Lying Oxygen Therapy SpO2: 95 % O2 Device: None (Room air) Pain: Pt reports pain in L arm. Therapist assisted with repositioning.   See FIM for current functional status  Therapy/Group: Individual Therapy  Freddye Cardamone, Quillian Quince 05/07/2013, 8:22 AM

## 2013-05-07 NOTE — Plan of Care (Signed)
Problem: RH Dressing Goal: LTG Patient will perform upper body dressing (OT) LTG Patient will perform upper body dressing with assist, with/without cues (OT).  Outcome: Not Met (add Reason) Pt requires mod assist for dressing

## 2013-05-07 NOTE — Plan of Care (Signed)
Problem: RH PAIN MANAGEMENT Goal: RH STG PAIN MANAGED AT OR BELOW PT'S PAIN GOAL < 3  Outcome: Completed/Met Date Met:  05/07/13 No c/o pain. Utilizing Voltaren gel to L clavicle at times

## 2013-05-07 NOTE — Plan of Care (Signed)
Problem: RH SAFETY Goal: RH STG ADHERE TO SAFETY PRECAUTIONS W/ASSISTANCE/DEVICE STG Adhere to Safety Precautions With min Assistance/Device.  Outcome: Completed/Met Date Met:  05/07/13 Calls appropriately

## 2013-05-07 NOTE — Plan of Care (Signed)
Problem: RH Balance Goal: LTG: Patient will maintain dynamic sitting balance (OT) LTG: Patient will maintain dynamic sitting balance with assistance during activities of daily living (OT)  Outcome: Not Met (add Reason) Pt inconsistent with sitting balance. Required min-mod assist with fatigue.

## 2013-05-07 NOTE — Progress Notes (Addendum)
Recreational Therapy Session Note  Patient Details  Name: Kelli [name] MRN: [id number] Date of Birth: 01-Aug-1950 Today's Date: 05/07/2013  Pain: no c/o Skilled Therapeutic Interventions/Progress Updates: Session focused on activity tolerance, w/c mobility, furniture transfer & discharge planning.  Pt propelled w/c using RUE/RLE with min assist mod multimodal cues.  Pt performed stand pivot transfer with mod assist and min cues to outdoor furniture in solarium.  Pt discussing discharge plan has changed to SNF and expressed some anxiety about transitioning there in near future especially as it pertains to a roommate.  Reviewed with pt her interest in doing activities with her grandchildren, specifically crafts and ways that she could adapt activities to meet all their needs.  Pt stated understanding and excitement about returning to those leisure activities post discharge.    Dashon Mcintire 05/07/2013, 11:58 AM

## 2013-05-07 NOTE — Progress Notes (Signed)
Occupational Therapy Discharge Summary  Patient Details  Name: Kelli [name] MRN: [id number] Date of Birth: 1950-03-27  Today's Date: 05/07/2013  Patient has met 8 of 10 long term goals due to improved activity tolerance, improved balance, postural control and improved attention.  Patient to discharge at overall mod assist functional transfers and mod-max assist self-care tasks level.  [name] care partner is not necessary to provide the physical and cognitive assistance as patient is discharging to SNF.  Reasons goals not met: Patient did not meet 2 goals as she is limited by L hemiparesis and cognitive deficits.   Recommendation:  Patient will benefit from ongoing skilled OT services in skilled nursing facility setting to continue to advance functional skills in the area of BADL.  Equipment: No equipment provided  Reasons for discharge: lack of progress toward goals and discharge from hospital  Patient/family agrees with progress made and goals achieved: Yes  OT Discharge Precautions/Restrictions  Precautions Precautions: Fall Precaution Comments: Lt field cut and Lt inattention; LUE subluxation  Restrictions Weight Bearing Restrictions: No General   Vital Signs Therapy Vitals BP: 140/48 mmHg Pain   ADL   Vision/Perception  Vision - History Baseline Vision: Wears glasses only for reading Visual History: Cataracts Patient Visual Report: No change from baseline Vision - Assessment Vision Assessment: Vision tested Tracking/Visual Pursuits: Left eye does not track laterally;Left eye does not track medially;Requires cues, head turns, or add eye shifts to track Visual Fields: Left visual field deficit Perception Perception: Impaired Inattention/Neglect: Does not attend to left visual field Praxis Praxis: Impaired Praxis Impairment Details: Initiation;Motor planning  Cognition Overall Cognitive Status: Impaired/Different from baseline Arousal/Alertness:  Awake/alert Orientation Level: Oriented X4 Sustained Attention: Impaired Memory: Impaired Awareness: Impaired Problem Solving: Impaired Safety/Judgment: Impaired Sensation Sensation Light Touch: Appears Intact Coordination Gross Motor Movements are Fluid and Coordinated: No Fine Motor Movements are Fluid and Coordinated: No Motor    Mobility     Trunk/Postural Assessment     Balance   Extremity/Trunk Assessment RUE Assessment RUE Assessment: Within Functional Limits LUE Assessment LUE Assessment: Exceptions to [facility] (trace shoulder adduction; shoulder subluxatoin noted)  See FIM for current functional status  Merle Whitehorn N 05/07/2013, 2:52 PM

## 2013-05-07 NOTE — Discharge Summary (Signed)
Physician Discharge Summary  Patient ID: Kelli [name] MRN: [id number] DOB/AGE: December 07, 1950 63 y.o.  Admit date: 04/17/2013 Discharge date: 05/07/2013  Discharge Diagnoses:  Principal Problem:   CVA (cerebral infarction) Active Problems:   HTN (hypertension)   CKD (chronic kidney disease) stage 3, GFR 30-59 ml/min   Left-sided weakness   Paresthesias in left hand   Pre-diabetes   E. coli UTI   Left shoulder pain due to subluxation and/or adhesive capsulitis.    Discharged Condition: Stable.   Significant Diagnostic Studies:   Labs:  Basic Metabolic Panel:  Recent Labs Lab 05/01/13 1055 05/07/13 1210  NA 140 138  K 4.4 4.5  CL 104 103  CO2 21 21  GLUCOSE 155* 118*  BUN 19 18  CREATININE 1.44* 1.37*  CALCIUM 9.4 9.4    CBC: No results found for this basename: WBC, NEUTROABS, HGB, HCT, MCV, PLT,  in the last 168 hours  CBG:  Recent Labs Lab 05/06/13 1633 05/06/13 1754 05/06/13 2058 05/07/13 0718 05/07/13 1127  GLUCAP 237* 188* 88 117* 132*    Brief HPI:   Kelli [name] is a 63 y.o. right-handed female with history of fibromyalgia, hypertension, anxiety disorder, bi hemispheric watershed strokes August 2014 (CIR 09/2012) on aspirin therapy as well as left carotid CEA 09/2012. She was admitted on 04/08/2013 with dizziness, N/V as well as left-sided numbness and weakness. MRI of the brain showed acute infarct in the left cerebellum.  Carotid Dopplers showed left CEA patent and right vertebral artery flow antegrade. Patient had complaints of headaches as well as waxing and waning of left sided weakness with lability. MRI brain was repeated 04/13/13 showing new restricted diffusion entire R-PCA involving right occipital lobe, mesial and posterior right temporal lobe, mid brain and posterior right thalamus and slight increase in left cerebellar stroke. 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 She was placed on ECASA + plavix by neurology.  Implantable loop recorder placed by Dr. Lovena Le. Patient with resultant dense Left hemiparesis, left inattention, left HH and dysphagia. She was admitted to [facility] for progressive therapies.     Hospital Course: Kelli [name] was admitted to rehab 04/17/2013 for inpatient therapies to consist of PT, ST and OT at least three hours five days a week. Past admission physiatrist, therapy team and rehab RN have worked together to provide customized collaborative inpatient rehab. Patient was maintained on ASA and plavix for stroke prevention. She was started on nocturnal IVF for renal insufficiency due to nectar thick liquids and poor po intake. Serial check of lytes were done to monitor CKD. Once she was advanced to thin liquids, IVF were discontinued. Follow up labs show renal status has improved.  Blood sugars were monitored with ac/hs checks and have been reasonable controlled. She has had complains of dysesthesias with  left shoulder and hand pain. She was started on neurontin for symptoms due to subluxation as well as adhesive capsulitis.  Blood pressures were poorly controlled at admission and medications were titrated and spread out for more consistent control.   Ritalin was initiated to help with apathy but discontinued as ineffective. LCSW has followed for support. Ditropan was discontinued due to retention requiring intermittent in and out cath. Follow up urine culture on 05/04/13 showed > 100,000 E coli and keflex was added for treatment. Po intake has improved and patient has been showing improvement in awareness of deficits.  She continues to requires physical assistance as well as cueing for left field cut with left inattention. She is showing improvement in awareness of deficits but continues to have poor insight. She requires substantial assistance and husband has decided on continued therapies at SNF.  Bed is available at [facility] on  05/08/13   Rehab course: During patient's stay in rehab weekly team conferences were held to monitor patient's progress, set goals and discuss barriers to discharge. Patient has had improvement in activity tolerance, balance, postural control, decrease in pain, as well as ability to compensate for deficits. She is has had improvement in functional use LUE  and LLE as well as improved awareness. She requires min assist for squat pivot transfers with moderate cueing for set up and moderate assist for stand pivot transfers. She requires mod assist with bathing and dressing tasks. She is able to tolerate dysphagia 3, thin liquids with supervision. She requires moderate assist for basic problem solving, attention as well as recall of new information. She will continue to require PT,OT and ST past discharge.    Disposition: 62-Rehab Facility  Diet: Dysphagia 3, thin liquids. Low carb.  Special Instructions: 1. Needs full supervision with meals. Medications whole in puree. Intermittent throat clear and re-swallow. Check for oral residue.  2. Keep LUE elevated when in chair/bed. 3.  Toilet patient every 4 hours. Monitor urine output and check PVR at least bid. Cath for volumes greater than 350 cc.         Future Appointments Provider Department Dept Phone   05/21/2013 11:40 AM Satira Sark, MD [place] [phone]   05/22/2013 2:30 PM [facility] [place] [phone]   05/22/2013 3:45 PM [name], MD [facility] [phone]   06/04/2013 10:45 AM Charlett Blake[facility] [phone]       Medication List    STOP taking these medications       tolterodine 4 MG 24 hr capsule  Commonly known as:  DETROL LA      TAKE these medications       acetaminophen 325 MG tablet  Commonly known as:  TYLENOL  Take 1-2 tablets (325-650 mg total) by mouth every 4 (four) hours as needed for  mild pain.     alum & mag hydroxide-simeth 200-200-20 MG/5ML suspension  Commonly known as:  MAALOX/MYLANTA  Take 30 mLs by mouth every 4 (four) hours as needed for indigestion.     amLODipine 10 MG tablet  Commonly known as:  NORVASC  Take 10 mg by mouth daily.     aspirin EC 81 MG tablet  Take 81 mg by mouth every morning.     atenolol 25 MG tablet  Commonly known as:  TENORMIN  Take 2 tablets (50 mg total) by mouth 2 (two) times daily.     cephALEXin 250 MG capsule  Commonly known as:  KEFLEX  Take 1 capsule (250 mg total) by mouth every 8 (eight) hours. For four more days     clonazePAM 0.5 MG tablet  Commonly known as:  KLONOPIN  Take 0.5 tablets (0.25 mg total) by mouth 2 (two) times daily as needed for anxiety.     cloNIDine 0.3 mg/24hr patch  Commonly known as:  CATAPRES - Dosed in mg/24 hr  Place 0.3 mg onto the skin once a week. On Tuesday     clopidogrel 75 MG tablet  Commonly known as:  PLAVIX  Take 1 tablet (75 mg total) by mouth daily with breakfast.     diclofenac sodium 1 % Gel  Commonly known as:  VOLTAREN  Apply 2 g topically 4 (four) times daily. To left shoulder     gabapentin 300 MG capsule  Commonly known as:  NEURONTIN  Take 1 capsule (300 mg total) by mouth at bedtime.     hydrALAZINE 50 MG tablet  Commonly known as:  APRESOLINE  Take 1 tablet (50 mg total) by mouth 4 (four) times daily.     HYDROcodone-acetaminophen 5-325 MG per tablet Rx #15 pills   Commonly known as:  NORCO/VICODIN  Take 1 tablet by mouth every 6 (six) hours as needed for moderate pain.     insulin aspart 100 UNIT/ML injection  Commonly known as:  novoLOG  Inject 0-5 Units into the skin at bedtime.     insulin aspart 100 UNIT/ML injection  Commonly known as:  novoLOG  Inject 0-9 Units into the skin 3 (three) times daily with meals.     megestrol 400 MG/10ML suspension  Commonly known as:  MEGACE  Take 10 mLs (400 mg total) by mouth 2 (two) times daily.      polyethylene glycol packet  Commonly known as:  MIRALAX / GLYCOLAX  Take 17 g by mouth 2 (two) times daily.     senna-docusate 8.6-50 MG per tablet  Commonly known as:  Senokot-S  Take 2 tablets by mouth 2 (two) times daily.     simvastatin 10 MG tablet  Commonly known as:  ZOCOR  Take 1 tablet (10 mg total) by mouth daily at 6 PM.       Follow-up Information   Follow up with Charlett Blake, MD On 06/04/2013. (Be there at 10:15 for 10:45 am  appointment )    Specialty:  Physical Medicine and Rehabilitation   Contact information:   [place] [address] [phone]       Follow up with Forbes Cellar, MD. Call today. (for appointment in 6 weeks. )    Specialties:  Neurology, Radiology   Contact information:   [address] [place] [address] [phone]       Follow up with Delphina Cahill, MD On 06/12/2013. (@ 2:00 PM)    Specialty:  Internal Medicine   Contact information:    [place] [address] [phone]       Signed: Bary Leriche 05/07/2013, 4:42 PM

## 2013-05-07 NOTE — Progress Notes (Signed)
Physical Therapy Discharge Summary  Patient Details  Name: Kelli [name] MRN: [id number] Date of Birth: [birth year]  Today's Date: 05/07/2013  Patient has met 8 of 8 long term goals due to improved activity tolerance, improved balance, improved postural control, increased strength, decreased pain, ability to compensate for deficits, improved attention and improved awareness.  Patient to discharge at a wheelchair level [place].   [name] care partner is not necessary to provide physical/cognitive assistance, as patient will be discharging to a skilled nursing facility.  Reasons goals not met: N/A; all goals met.  Recommendation:  Patient will benefit from ongoing skilled PT services in skilled nursing facility setting to continue to advance safe functional mobility, address ongoing impairments in stability/independence with functional mobility, and minimize fall risk.  Equipment: w/c (18"x16") and cushion  Reasons for discharge: treatment goals met and discharge from hospital  Patient/family agrees with progress made and goals achieved: Yes  PT Discharge Precautions/Restrictions Precautions Precautions: Fall Precaution Comments: Lt field cut and Lt inattention; LUE subluxation  Restrictions Weight Bearing Restrictions: No Vital Signs Therapy Vitals BP: 140/48 mmHg Pain  Pt reports no pain during AM session. Vision/Perception  Vision - History Baseline Vision: Wears glasses only for reading Visual History: Cataracts Patient Visual Report: No change from baseline Vision - Assessment Vision Assessment: Vision tested Tracking/Visual Pursuits: Left eye does not track laterally;Left eye does not track medially;Requires cues, head turns, or add eye shifts to track Visual Fields: Left visual field deficit Perception Perception: Impaired Inattention/Neglect: Does not attend to left visual field Praxis Praxis: Impaired Praxis Impairment Details: Initiation;Motor planning   Cognition Overall Cognitive Status: Impaired/Different from baseline Arousal/Alertness: Awake/alert Orientation Level: Oriented X4 Sustained Attention: Impaired Memory: Impaired Awareness: Impaired Problem Solving: Impaired Safety/Judgment: Impaired Sensation Sensation Light Touch: Impaired by gross assessment Proprioception: Impaired Detail Proprioception Impaired Details: Impaired LLE Additional Comments: Proprioception impaired in L ankle. Pt reports that light touch is diminished in LLE as compared with RLE.  Coordination Gross Motor Movements are Fluid and Coordinated: No Fine Motor Movements are Fluid and Coordinated: No Heel Shin Test: Unable to formally assess secondary to L hemiplegia. Motor  Motor Motor: Hemiplegia;Abnormal tone;Abnormal postural alignment and control Motor - Discharge Observations: L hemiplegia, L ankle clonus  Mobility Bed Mobility Bed Mobility: Rolling Right;Rolling Left;Supine to Sit;Sitting - Scoot to [employer] of Bed;Sit to Supine;Scooting to [facility] Rolling Right: 4: Min assist Rolling Right Details: Manual facilitation for weight shifting;Verbal cues for technique Rolling Left: 5: Supervision Rolling Left Details: Manual facilitation for weight shifting;Verbal cues for technique Supine to Sit: 4: Min assist Supine to Sit Details: Manual facilitation for weight shifting;Tactile cues for sequencing;Verbal cues for technique;Verbal cues for sequencing Sitting - Scoot to Edge of Bed: 5: Supervision Sit to Supine: 4: Min assist Sit to Supine - Details (indicate cue type and reason): Min A for LLE management Scooting to HOB: 4: Min assist Scooting to [facility]: Patient Percentage: 80% Scooting to [facility] Details: Tactile cues for placement;Tactile cues for weight bearing;Manual facilitation for weight bearing;Verbal cues for technique;Verbal cues for sequencing Transfers Transfers: Yes Sit to Stand: 4: Min assist;With upper extremity assist;With armrests;From  bed;From chair/3-in-1 Sit to Stand Details: Tactile cues for weight beaing;Manual facilitation for weight shifting Stand to Sit: To chair/3-in-1;4: Min assist;With armrests;To bed Stand to Sit Details (indicate cue type and reason): Manual facilitation for weight shifting;Verbal cues for precautions/safety Stand Pivot Transfers: 3: Mod assist;With armrests Squat Pivot Transfers: 4: Min assist;3: Mod assist Special educational needs teacher Details (indicate  cue type and reason): Min A to R side; Mod A to L side Lateral/Scoot Transfers: 4: Min assist;3: Mod assist Locomotion  Ambulation Ambulation: No Gait Gait: No Stairs / Additional Locomotion Stairs: No Architect: Yes Wheelchair Assistance: 4: Advertising account executive Details: Tactile cues for weight beaing;Verbal cues for precautions/safety Wheelchair Propulsion: Right upper extremity;Right lower extremity Wheelchair Parts Management: Needs assistance;Supervision/cueing;Other (comment) (Able to manage w/c brakes, arm rests with supervision/cueing; requires assist for management of leg rests.) Distance: 55  Trunk/Postural Assessment  Cervical Assessment Cervical Assessment: Within Functional Limits Cervical AROM Overall Cervical AROM: Within functional limits for tasks performed Thoracic Assessment Thoracic Assessment: Within Functional Limits Lumbar Assessment Lumbar Assessment: Within Functional Limits Postural Control Postural Control: Deficits on evaluation Postural Limitations: In seated, weightbearing on R hip>L hip.   Balance Balance Balance Assessed: Yes Static Sitting Balance Static Sitting - Balance Support: No upper extremity supported;Feet supported Static Sitting - Level of Assistance: 5: Stand by assistance Dynamic Sitting Balance Dynamic Sitting - Balance Support: Feet supported;During functional activity;Right upper extremity supported Dynamic Sitting - Level of Assistance: 5: Stand  by assistance Sitting balance - Comments: Seated EOB, bilat scooting with RUE support, supervision. Extremity Assessment  RUE Assessment RUE Assessment: Within Functional Limits LUE Assessment LUE Assessment: Exceptions to [facility] (trace shoulder adduction; shoulder subluxatoin noted) RLE Assessment RLE Assessment: Within Functional Limits LLE Assessment LLE Assessment: Exceptions to [facility] LLE Strength LLE Overall Strength: Deficits Left Hip Flexion: 1/5 Left Knee Flexion: 1/5 Left Knee Extension: 3-/5 Left Ankle Dorsiflexion: 0/5 Left Ankle Plantar Flexion: 2-/5 LLE Tone LLE Tone Comments: Clonus (>10 beats) in L ankle plantarflexors  See FIM for current functional status  [place], Malva Cogan 05/07/2013, 4:26 PM

## 2013-05-07 NOTE — Progress Notes (Signed)
Social Work Patient ID: Kelli [name], female   DOB: 22-Aug-1950, 63 y.o.   MRN: [id number]  Pt was offered and accepted a SNF bed at [facility].  CSW spoke with Dr. Letta Pate and he feels pt can be d/c'd tomorrow.  CSW informed pt's husband, Kelli [name] (PA), and therapy team of the plan for pt to tx to [facility] on 05-08-13 via ambulance.  Pt is pleased to know of the next step in the process, but is a bit anxious about having to meet and get used to new therapy staff, as well as having a roommate.  Pt's husband was appreciative of plan for tx to [facility] working out.  He will contact Tami to complete admission paperwork.  CSW will continue to assist with pt's tx to SNF tomorrow.

## 2013-05-07 NOTE — Progress Notes (Signed)
Subjective/Complaints: 63 y.o. 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 She was admitted on 04/08/2013 with dizziness, N/V as well as left-sided numbness and weakness.   "when am I leaving?" ROS:  Constipation, bladder working ok, weakness Objective: Vital Signs: Blood pressure 157/65, pulse 49, temperature 98.5 F (36.9 C), temperature source Axillary, resp. rate 18, height 5' 4.96" (1.65 m), weight 87.408 kg (192 lb 11.2 oz), SpO2 95.00%.   Results for orders placed during the hospital encounter of 04/17/13 (from the past 72 hour(s))  GLUCOSE, CAPILLARY     Status: Abnormal   Collection Time    05/04/13  7:36 AM      Result Value Ref Range   Glucose-Capillary 127 (*) 70 - 99 mg/dL  GLUCOSE, CAPILLARY     Status: Abnormal   Collection Time    05/04/13 11:26 AM      Result Value Ref Range   Glucose-Capillary 115 (*) 70 - 99 mg/dL  GLUCOSE, CAPILLARY     Status: Abnormal   Collection Time    05/04/13  4:09 PM      Result Value Ref Range   Glucose-Capillary 129 (*) 70 - 99 mg/dL  URINALYSIS, ROUTINE W REFLEX MICROSCOPIC     Status: Abnormal   Collection Time    05/04/13  7:06 PM      Result Value Ref Range   Color, Urine YELLOW  YELLOW   APPearance TURBID (*) CLEAR   Specific Gravity, Urine 1.022  1.005 - 1.030   pH 5.5  5.0 - 8.0   Glucose, UA NEGATIVE  NEGATIVE mg/dL   Hgb urine dipstick TRACE (*) NEGATIVE   Bilirubin Urine NEGATIVE  NEGATIVE   Ketones, ur NEGATIVE  NEGATIVE mg/dL   Protein, ur 100 (*) NEGATIVE mg/dL   Urobilinogen, UA 0.2  0.0 - 1.0 mg/dL   Nitrite POSITIVE (*) NEGATIVE   Leukocytes, UA LARGE (*) NEGATIVE  URINE MICROSCOPIC-ADD ON     Status: Abnormal   Collection Time    05/04/13  7:06 PM      Result Value Ref Range   Squamous Epithelial / LPF FEW (*) RARE   WBC, UA TOO NUMEROUS TO COUNT   <3 WBC/hpf   RBC / HPF 0-2  <3 RBC/hpf   Bacteria, UA MANY (*) RARE   Casts HYALINE CASTS (*) NEGATIVE  URINE CULTURE     Status: None   Collection Time    05/04/13  7:08 PM      Result Value Ref Range   Specimen Description URINE, RANDOM     Special Requests None     Culture  Setup Time       Value: 05/04/2013 20:05     Performed at [facility] Count       Value: >=100,000 COLONIES/ML     Performed at [facility]   Culture       Value: ESCHERICHIA COLI     Performed at [facility]   Report Status PENDING    GLUCOSE, CAPILLARY     Status: Abnormal   Collection Time    05/04/13  9:04 PM      Result Value Ref Range   Glucose-Capillary 133 (*) 70 - 99 mg/dL   Comment 1 Notify RN    GLUCOSE, CAPILLARY     Status: Abnormal   Collection Time  05/05/13  7:29 AM      Result Value Ref Range   Glucose-Capillary 123 (*) 70 - 99 mg/dL   Comment 1 Notify RN    GLUCOSE, CAPILLARY     Status: Abnormal   Collection Time    05/05/13 11:32 AM      Result Value Ref Range   Glucose-Capillary 106 (*) 70 - 99 mg/dL   Comment 1 Notify RN    GLUCOSE, CAPILLARY     Status: Abnormal   Collection Time    05/05/13  4:41 PM      Result Value Ref Range   Glucose-Capillary 112 (*) 70 - 99 mg/dL   Comment 1 Notify RN    GLUCOSE, CAPILLARY     Status: Abnormal   Collection Time    05/05/13  8:59 PM      Result Value Ref Range   Glucose-Capillary 127 (*) 70 - 99 mg/dL   Comment 1 Notify RN    GLUCOSE, CAPILLARY     Status: Abnormal   Collection Time    05/06/13  6:08 AM      Result Value Ref Range   Glucose-Capillary 112 (*) 70 - 99 mg/dL   Comment 1 Notify RN    GLUCOSE, CAPILLARY     Status: Abnormal   Collection Time    05/06/13 11:33 AM      Result Value Ref Range   Glucose-Capillary 128 (*) 70 - 99 mg/dL   Comment 1 Notify RN    GLUCOSE, CAPILLARY     Status: Abnormal   Collection Time    05/06/13  4:33 PM      Result Value Ref Range    Glucose-Capillary 237 (*) 70 - 99 mg/dL   Comment 1 Notify RN    GLUCOSE, CAPILLARY     Status: Abnormal   Collection Time    05/06/13  5:54 PM      Result Value Ref Range   Glucose-Capillary 188 (*) 70 - 99 mg/dL   Comment 1 Documented in Chart    GLUCOSE, CAPILLARY     Status: None   Collection Time    05/06/13  8:58 PM      Result Value Ref Range   Glucose-Capillary 88  70 - 99 mg/dL   Comment 1 Notify RN    GLUCOSE, CAPILLARY     Status: Abnormal   Collection Time    05/07/13  7:18 AM      Result Value Ref Range   Glucose-Capillary 117 (*) 70 - 99 mg/dL   Comment 1 Notify RN        General: No acute distress Mood and affect are appropriate Heart: Regular rate and rhythm no rubs murmurs or extra sounds Lungs: Clear to auscultation, breathing unlabored, no rales or wheezes Abdomen: Positive bowel sounds, soft nontender to palpation, nondistended Extremities: No clubbing, cyanosis, or edema Skin: No evidence of breakdown, no evidence of rash Neurologic: Cranial nerves II through XII intact, motor strength is 5/5 in Right deltoid, bicep, tricep, grip, hip flexor, knee extensors, ankle dorsiflexor and plantar flexor 0/5 on left side except trace Left Hip add,2- KE Sensory exam normal sensation to light touch and proprioception in right upper and lower extremities, feels deep pressure on left side Cerebellar exam cannot assess on left secondary to weakness Musculoskeletal: Pain with Left shouldr ROM, + Sublux Right gaze preference.   Assessment/Plan: 1. Functional deficits secondary to Right PCA infarct with Left HP, L HH which require 3+ hours  per day of interdisciplinary therapy in a comprehensive inpatient rehab setting. Physiatrist is providing close team supervision and 24 hour management of active medical problems listed below. Physiatrist and rehab team continue to assess barriers to discharge/monitor patient progress toward functional and medical goals.  FIM: FIM -  Bathing Bathing Steps Patient Completed: Chest;Abdomen;Front perineal area;Left Arm;Right upper leg;Left upper leg;Right lower leg (including foot) Bathing: 3: Mod-Patient completes 5-7 83f 10 parts or 50-74%  FIM - Upper Body Dressing/Undressing Upper body dressing/undressing steps patient completed: Put head through opening of pull over shirt/dress;Thread/unthread right sleeve of pullover shirt/dresss Upper body dressing/undressing: 3: Mod-Patient completed 50-74% of tasks FIM - Lower Body Dressing/Undressing Lower body dressing/undressing steps patient completed: Thread/unthread right pants leg Lower body dressing/undressing: 2: Max-Patient completed 25-49% of tasks  FIM - Toileting Toileting steps completed by patient: Performs perineal hygiene Toileting Assistive Devices: Grab bar or rail for support Toileting: 0: Activity did not occur  FIM - Radio producer Devices: Grab bars Toilet Transfers: 3-To toilet/BSC: Mod A (lift or lower assist);2-From toilet/BSC: Max A (lift and lower assist)  FIM - Control and instrumentation engineer Devices: Copy: 2: Bed > Chair or W/C: Max A (lift and lower assist);2: Chair or W/C > Bed: Max A (lift and lower assist);3: Chair or W/C > Bed: Mod A (lift or lower assist);3: Bed > Chair or W/C: Mod A (lift or lower assist)  FIM - Locomotion: Wheelchair Distance: 2x50 Locomotion: Wheelchair: 2: Travels 50 - 149 ft with moderate assistance (Pt: 50 - 74%) FIM - Locomotion: Ambulation Locomotion: Ambulation Assistive Devices:  (R arm rail) Ambulation/Gait Assistance: 1: +2 Total assist (mod A and + 2 assist for follow with the wheel chair) Locomotion: Ambulation: 0: Activity did not occur  Comprehension Comprehension Mode: Auditory Comprehension: 5-Understands basic 90% of the time/requires cueing < 10% of the time  Expression Expression Mode: Verbal Expression: 5-Expresses basic  needs/ideas: With extra time/assistive device  Social Interaction Social Interaction: 4-Interacts appropriately 75 - 89% of the time - Needs redirection for appropriate language or to initiate interaction.  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 DVT Prophylaxis/Anticoagulation: SCD's, sq heparin.  2. Fibromyalgia/Lleft shoulder pain due to subluxation/ Pain Management:no pain c/os today  , dysesthetic pain, gabapentin                                         3. Anxiety disorder/ Mood: Poor awareness with apathy noted. Ritalin was not helpful LCSW to follow for support and evaluation.  4. Neuropsych: This patient is not capable of making decisions on her own behalf.  5. HTN: will monitor with bid checks. Continue catapress, atenolol, norvasc and hydralazine. Generally improved. Check orthostatics given dizziness c/o, parameters for BB 6. Borderline diabetes: Hgb A1c-6.3/MFG-134. CM diet restrictions. SSI for elevated BS---most thickened liquids are sweetened.  7: Dyslipidemia: Continue zocor  8. CKD stage 3:  serial checks.  9. Dysphagia: continue D2,nectar liquids  with aspiration precautions. IVF at bedtime to maintain adequate hydration. Patient/family non-compliant--continue to educate on aspiration risk.  10. Constipation: Continue miralax and senna.  99. Hyperactive bladder:secondary to CVA, now with UTI, stop ditropan, start abx, await cx 12.  Left shoulder pain- subluxation, may also have adhesive capsulitis trial FES 13.  UTI ecoli on keflex with cult  pending  LOS (Days) 20  A FACE TO [place] E 05/07/2013, 7:32 AM

## 2013-05-08 ENCOUNTER — Inpatient Hospital Stay
Admission: RE | Admit: 2013-05-08 | Discharge: 2013-06-15 | Disposition: A | Discharge status: Home / Self Care | Payer: 59 | Source: Ambulatory Visit | Attending: Internal Medicine | Admitting: Internal Medicine

## 2013-05-08 DIAGNOSIS — R609 Edema, unspecified: Principal | ICD-10-CM

## 2013-05-08 DIAGNOSIS — R52 Pain, unspecified: Secondary | ICD-10-CM

## 2013-05-08 LAB — GLUCOSE, CAPILLARY: Glucose-Capillary: 114 mg/dL — ABNORMAL HIGH (ref 70–99)

## 2013-05-08 NOTE — Consult Note (Signed)
NEUROCOGNITIVE STATUS EXAMINATION - CONFIDENTIAL Kelli [name]   Kelli [name] is a 63 year old, right-handed woman, with history of bi-hemispheric watershed strokes in August, 2014, who was seen for a neurocognitive status examination to evaluate her emotional state and mental status post-stroke.  According to her medical record, she was admitted on 04/08/13 with dizziness, nausea/vomiting, and left-sided numbness and weakness.  MRI of the brain demonstrated acute infarct in the left cerebellum.  She did not receive TPA.  She reportedly experienced a severe headache as she was recovering as well as waxing and waning left sided weakness with lability.  MRI of the brain was repeated on 04/13/13 and revealed new restricted diffusion of the entire right posterior cerebral artery involving the right occipital lobe, mesial and posterior right temporal lobe, mid brain and posterior right thalamus as well as slight increase in left cerebellar stroke.  She had resultant dense left hemiparesis, left inattention, left homonymous hemianopia, and dysphagia.    Emotional Functioning:  During the clinical interview, Kelli [name] acknowledged the presence of significant anxious mood as well as very mild depression.  She clarified that she felt as though most of her anxiety was related to financial struggles.  Of note, Kelli [name] stated that she has struggled with severe depression in the past and although she has noticed some mild depressed mood at times, she does not feel as though her depression is significant.  She denied current or past suicidal ideation.  Kelli [name] also mentioned that she has participated in individual psychotherapy in the past but did not find it to be helpful.  In addition to financial concerns, Kelli [name] identified frustration over inability to walk and although she said that she can see that she is making physical progress, it is taking "way longer" than it did before, which  she said felt discouraging.  In general, she described this recent stroke as "a tornado that ripped through my life."  Kelli [name] admitted that she does not always feel motivated to participate in her therapies, but she recognizes that she needs to participate and she does not feel she has trouble honing her energy in order to participate.  Finally, Kelli [name] mentioned that it has been hard for her to let others see her like this because she does not want pity from them.  Kelli [name] was able to identify multiple coping strategies that she is using, including taking each struggle and each day as it comes and while she acknowledges her low moods, she said that she tries not to dwell on her frustrations.  She also mentioned that she has significant social support to help her during this time.  Overall, she described feeling optimistic and stated, "I'll get through it.  I have a lot to live for."    Kelli [name] responses to self-report measures of mood symptoms were suggestive of the presence of severe anxious mood and very mild depressed mood at this time.    Mental Status:  Kelli [name] total score on an overall measure of mental status was suggestive of the presence of significant cognitive impairment (MoCA = 16/28).  Of note, two items were unable to be administered due to visual disruption and left neglect.  Aside from expected visuospatial disruption, Kelli [name] low score was driven primarily by problems in delayed memory and working memory.  Subjectively, she stated that she has noticed memory inefficiencies since her recent stroke.    Impressions and Recommendations:  Kelli [name] overall neurocognitive profile  was suggestive of significant cognitive disruption, at the level of dementia, with particular challenges in delayed and working memory.  These cognitive changes are most likely related to her strokes and she should experience some degree of cognitive recovery over the several months to one year.   It will be important for her to stay cognitively active during this time and she expressed interest in having her family bring in [place] puzzles so that she can activate her mind while on the unit. From an emotional standpoint, Kelli [name] certainly seems to be experiencing depressed mood, though her depression appears to be relatively under control.  However, her anxious mood looks to be pervasive and is likely impacting her ability to fully participate in her recovery.  Despite the adverse impact that her anxiety may be playing at this time, Kelli [name] was unwilling to entertain the idea of medication adjustments or psychotherapy.  We spent time in the session normalizing her reactions, exploring signs that she can look for to see if her depression is worsening in the future, and in identifying her inner strength in order to overcome obstacles that are currently in front of her.  Regarding prevention of future strokes, Kelli [name] stated that she did not feel as though she was educated about diet after her last stroke, but feels equipped with knowledge on proper diet this time around and is optimistic about her ability to adhere to proper diet following discharge.    DIAGNOSIS: CVA Anxiety disorder, NOS  Marlane Hatcher, Psy.D.  Clinical Neuropsychologist

## 2013-05-08 NOTE — Progress Notes (Signed)
Pt discharged to [facility] via transport at 1050. Belongings with pt and report called to RN receiving pt.

## 2013-05-08 NOTE — Progress Notes (Signed)
Recreational Therapy Discharge Summary Patient Details  Name: Kelli [name] MRN: [id number] Date of Birth: 1950-06-24 Today's Date: 05/08/2013  Long term goals set: 1  Long term goals met: 1  Comments on progress toward goals: Pt discharged to SNF today for continued therapies & 24 hour care.  Pt at min assist level for simple TR tasks seated with mod multimodal cues.    Reasons for discharge: discharge from hospital  Follow-up: encourage participation in activities program  Patient/family agrees with progress made and goals achieved: Yes  [name] 05/08/2013, 4:52 PM

## 2013-05-08 NOTE — Plan of Care (Signed)
Problem: RH BOWEL ELIMINATION Goal: RH STG MANAGE BOWEL WITH ASSISTANCE STG Manage Bowel with min Assistance.  Outcome: Not Met (add Reason) Pt incont. of bowel. Pt going to SNF

## 2013-05-08 NOTE — Plan of Care (Signed)
Problem: RH BLADDER ELIMINATION Goal: RH STG MANAGE BLADDER WITH ASSISTANCE STG Manage Bladder With min Assistance  Outcome: Not Met (add Reason) Pt incont. Of bladder. Pt going to SNF

## 2013-05-08 NOTE — Progress Notes (Signed)
Subjective/Complaints: 63 y.o. 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 She was admitted on 04/08/2013 with dizziness, N/V as well as left-sided numbness and weakness.   "when am I leaving?" ROS:  Constipation, bladder working ok, weakness Objective: Vital Signs: Blood pressure 148/62, pulse 59, temperature 98.3 F (36.8 C), temperature source Oral, resp. rate 17, height 5' 4.96" (1.65 m), weight 87.408 kg (192 lb 11.2 oz), SpO2 97.00%.   Results for orders placed during the hospital encounter of 04/17/13 (from the past 72 hour(s))  GLUCOSE, CAPILLARY     Status: Abnormal   Collection Time    05/05/13 11:32 AM      Result Value Ref Range   Glucose-Capillary 106 (*) 70 - 99 mg/dL   Comment 1 Notify RN    GLUCOSE, CAPILLARY     Status: Abnormal   Collection Time    05/05/13  4:41 PM      Result Value Ref Range   Glucose-Capillary 112 (*) 70 - 99 mg/dL   Comment 1 Notify RN    GLUCOSE, CAPILLARY     Status: Abnormal   Collection Time    05/05/13  8:59 PM      Result Value Ref Range   Glucose-Capillary 127 (*) 70 - 99 mg/dL   Comment 1 Notify RN    GLUCOSE, CAPILLARY     Status: Abnormal   Collection Time    05/06/13  6:08 AM      Result Value Ref Range   Glucose-Capillary 112 (*) 70 - 99 mg/dL   Comment 1 Notify RN    GLUCOSE, CAPILLARY     Status: Abnormal   Collection Time    05/06/13 11:33 AM      Result Value Ref Range   Glucose-Capillary 128 (*) 70 - 99 mg/dL   Comment 1 Notify RN    GLUCOSE, CAPILLARY     Status: Abnormal   Collection Time    05/06/13  4:33 PM      Result Value Ref Range   Glucose-Capillary 237 (*) 70 - 99 mg/dL   Comment 1 Notify RN    GLUCOSE, CAPILLARY     Status: Abnormal   Collection Time    05/06/13  5:54 PM      Result Value Ref Range   Glucose-Capillary 188 (*) 70 - 99 mg/dL    Comment 1 Documented in Chart    GLUCOSE, CAPILLARY     Status: None   Collection Time    05/06/13  8:58 PM      Result Value Ref Range   Glucose-Capillary 88  70 - 99 mg/dL   Comment 1 Notify RN    GLUCOSE, CAPILLARY     Status: Abnormal   Collection Time    05/07/13  7:18 AM      Result Value Ref Range   Glucose-Capillary 117 (*) 70 - 99 mg/dL   Comment 1 Notify RN    GLUCOSE, CAPILLARY     Status: Abnormal   Collection Time    05/07/13 11:27 AM      Result Value Ref Range   Glucose-Capillary 132 (*) 70 - 99 mg/dL   Comment 1 Notify RN    BASIC METABOLIC PANEL     Status: Abnormal   Collection Time    05/07/13 12:10 PM      Result Value Ref Range   Sodium  138  137 - 147 mEq/L   Potassium 4.5  3.7 - 5.3 mEq/L   Chloride 103  96 - 112 mEq/L   CO2 21  19 - 32 mEq/L   Glucose, Bld 118 (*) 70 - 99 mg/dL   BUN 18  6 - 23 mg/dL   Creatinine, Ser 1.37 (*) 0.50 - 1.10 mg/dL   Calcium 9.4  8.4 - 10.5 mg/dL   GFR calc non Af Amer 40 (*) >90 mL/min   GFR calc Af Amer 47 (*) >90 mL/min   Comment: (NOTE)     The eGFR has been calculated using the CKD EPI equation.     This calculation has not been validated in all clinical situations.     eGFR's persistently <90 mL/min signify possible Chronic Kidney     Disease.  GLUCOSE, CAPILLARY     Status: Abnormal   Collection Time    05/07/13  4:44 PM      Result Value Ref Range   Glucose-Capillary 102 (*) 70 - 99 mg/dL  GLUCOSE, CAPILLARY     Status: Abnormal   Collection Time    05/07/13  8:47 PM      Result Value Ref Range   Glucose-Capillary 158 (*) 70 - 99 mg/dL   Comment 1 Notify RN    GLUCOSE, CAPILLARY     Status: Abnormal   Collection Time    05/08/13  7:08 AM      Result Value Ref Range   Glucose-Capillary 114 (*) 70 - 99 mg/dL   Comment 1 Notify RN        General: No acute distress Mood and affect are appropriate Heart: Regular rate and rhythm no rubs murmurs or extra sounds Lungs: Clear to auscultation, breathing  unlabored, no rales or wheezes Abdomen: Positive bowel sounds, soft nontender to palpation, nondistended Extremities: No clubbing, cyanosis, or edema Skin: No evidence of breakdown, no evidence of rash Neurologic: Cranial nerves II through XII intact, motor strength is 5/5 in Right deltoid, bicep, tricep, grip, hip flexor, knee extensors, ankle dorsiflexor and plantar flexor 0/5 on left side except trace Left Hip add,2- KE Sensory exam normal sensation to light touch and proprioception in right upper and lower extremities, feels deep pressure on left side Cerebellar exam cannot assess on left secondary to weakness Musculoskeletal: Pain with Left shouldr ROM, + Sublux Right gaze preference.   Assessment/Plan: 1. Functional deficits secondary to Right PCA infarct with Left HP, L HH which require 3+ hours per day of interdisciplinary therapy in a comprehensive inpatient rehab setting. Stable for D/C to SNF today PMR f/u 3-4 wks.  FIM: FIM - Bathing Bathing Steps Patient Completed: Chest;Abdomen;Front perineal area;Left Arm;Right upper leg;Left upper leg Bathing: 3: Mod-Patient completes 5-7 [id number] parts or 50-74%  FIM - Upper Body Dressing/Undressing Upper body dressing/undressing steps patient completed: Put head through opening of pull over shirt/dress;Thread/unthread right sleeve of pullover shirt/dresss Upper body dressing/undressing: 3: Mod-Patient completed 50-74% of tasks FIM - Lower Body Dressing/Undressing Lower body dressing/undressing steps patient completed: Thread/unthread right pants leg;Thread/unthread left pants leg Lower body dressing/undressing: 2: Max-Patient completed 25-49% of tasks  FIM - Toileting Toileting steps completed by patient: Performs perineal hygiene Toileting Assistive Devices: Grab bar or rail for support Toileting: 2: Max-Patient completed 1 of 3 steps  FIM - TRadio producerDevices: Grab bars Toilet Transfers: 3-To  toilet/BSC: Mod A (lift or lower assist);3-From toilet/BSC: Mod A (lift or lower assist)  FIM - Bed/Chair Transfer  Bed/Chair Transfer Assistive Devices: Arm rests;Bed rails Bed/Chair Transfer: 4: Chair or W/C > Bed: Min A (steadying Pt. > 75%);3: Bed > Chair or W/C: Mod A (lift or lower assist);4: Sit > Supine: Min A (steadying pt. > 75%/lift 1 leg);4: Supine > Sit: Min A (steadying Pt. > 75%/lift 1 leg)  FIM - Locomotion: Wheelchair Distance: 55 Locomotion: Wheelchair: 2: Travels 50 - 149 ft with minimal assistance (Pt.>75%) FIM - Locomotion: Ambulation Locomotion: Ambulation Assistive Devices:  (R arm rail) Ambulation/Gait Assistance: 1: +2 Total assist (mod A and + 2 assist for follow with the wheel chair) Locomotion: Ambulation: 0: Activity did not occur  Comprehension Comprehension Mode: Auditory Comprehension: 5-Understands basic 90% of the time/requires cueing < 10% of the time  Expression Expression Mode: Verbal Expression: 5-Expresses basic needs/ideas: With extra time/assistive device  Social Interaction Social Interaction: 4-Interacts appropriately 75 - 89% of the time - Needs redirection for appropriate language or to initiate interaction.  Problem Solving Problem Solving: 3-Solves basic 50 - 74% of the time/requires cueing 25 - 49% of the time  Memory Memory: 3-Recognizes or recalls 50 - 74% of the time/requires cueing 25 - 49% of the time  Medical Problem List and Plan:  left cerebellar infarct with extension and new R-PCA infarct affecting right occipital lobe, mesial and posterior right temporal lobe, mid brain and posterior right thalamus  -currently on ECASA and plavix for secondary stroke prevention  1. DVT Prophylaxis/Anticoagulation: SCD's, sq heparin.  2. Fibromyalgia/Lleft shoulder pain due to subluxation/ Pain Management:no pain c/os today  , dysesthetic pain, gabapentin                                         3. Anxiety disorder/ Mood: Poor awareness with  apathy noted. Ritalin was not helpful LCSW to follow for support and evaluation.  4. Neuropsych: This patient is not capable of making decisions on her own behalf.  5. HTN: will monitor with bid checks. Continue catapress, atenolol, norvasc and hydralazine. Generally improved. Check orthostatics given dizziness c/o, parameters for BB 6. Borderline diabetes: Hgb A1c-6.3/MFG-134. CM diet restrictions. SSI for elevated BS---most thickened liquids are sweetened.  7: Dyslipidemia: Continue zocor  8. CKD stage 3:  serial checks.  9. Dysphagia: continue D2,nectar liquids with aspiration precautions. IVF at bedtime to maintain adequate hydration. Patient/family non-compliant--continue to educate on aspiration risk.  10. Constipation: Continue miralax and senna.  70. Hyperactive bladder:secondary to CVA, now with UTI, stop ditropan, start abx, await cx 12.  Left shoulder pain- subluxation, may also have adhesive capsulitis trial FES 13.  UTI ecoli on keflex with cult pending  LOS (Days) 21  A FACE TO [place] E 05/08/2013, 7:49 AM

## 2013-05-08 NOTE — Progress Notes (Signed)
Social Work Discharge Note  The overall goal for the admission was met for:   Discharge location: No - pt needed to tx to SNF  Length of Stay: Yes - 21 days  Discharge activity level: No - mod assist for OT; min assist w/c level for PT  Home/community participation: No  Services provided included: MD, RD, PT, OT, SLP, RN, TR, Pharmacy, Neuropsych and SW  Financial Services: Private Insurance: [employer]  Follow-up services arranged: Other: Pt transferred to [facility] for SNF care.  Comments (or additional information):  Patient/Family verbalized understanding of follow-up arrangements: Yes  Individual responsible for coordination of the follow-up plan: pt's husband and SNF  Confirmed correct DME delivered: ,  [employer] 05/08/2013    ,  Capps 

## 2013-05-09 ENCOUNTER — Non-Acute Institutional Stay (SKILLED_NURSING_FACILITY): Payer: 59 | Admitting: Internal Medicine

## 2013-05-09 ENCOUNTER — Other Ambulatory Visit: Payer: Self-pay | Admitting: *Deleted

## 2013-05-09 DIAGNOSIS — I69959 Hemiplegia and hemiparesis following unspecified cerebrovascular disease affecting unspecified side: Secondary | ICD-10-CM

## 2013-05-09 DIAGNOSIS — R32 Unspecified urinary incontinence: Secondary | ICD-10-CM

## 2013-05-09 DIAGNOSIS — I10 Essential (primary) hypertension: Secondary | ICD-10-CM

## 2013-05-09 LAB — GLUCOSE, CAPILLARY
GLUCOSE-CAPILLARY: 166 mg/dL — AB (ref 70–99)
Glucose-Capillary: 122 mg/dL — ABNORMAL HIGH (ref 70–99)
Glucose-Capillary: 185 mg/dL — ABNORMAL HIGH (ref 70–99)

## 2013-05-09 MED ORDER — HYDROCODONE-ACETAMINOPHEN 5-325 MG PO TABS: ORAL_TABLET | ORAL | Status: DC

## 2013-05-09 MED ORDER — CLONAZEPAM 0.5 MG PO TABS
ORAL_TABLET | ORAL | Status: DC
Start: 2013-05-09 — End: 2015-04-14

## 2013-05-09 NOTE — Telephone Encounter (Signed)
[facility] 

## 2013-05-10 LAB — GLUCOSE, CAPILLARY
Glucose-Capillary: 118 mg/dL — ABNORMAL HIGH (ref 70–99)
Glucose-Capillary: 196 mg/dL — ABNORMAL HIGH (ref 70–99)

## 2013-05-10 NOTE — Progress Notes (Signed)
Patient ID: Kelli [name], female   DOB: 1950/10/09, 63 y.o.   MRN: [id number]                  HISTORY & PHYSICAL  DATE:  05/09/2013    FACILITY: [facility]    LEVEL OF CARE:   SNF   HISTORY OF PRESENT ILLNESS:  This is a 63 year-old woman who has a past history of a left cerebellar stroke who made a good recovery, living with her husband and son in her home near [place], [place].    She was admitted to hospital from 04/08/2013 through 04/17/2013 with acute onset of a right posterior cerebral artery infarct.  This left extensive damage in the right occipital lobe, the mid and posterior right temporal lobe, mid brain and posterior right thalamus.  There was no hemorrhage.  She was discharged to [facility] inpatient rehab where she was from 04/17/2013 through 05/07/2013 and has been sent here for ongoing care.      PAST MEDICAL HISTORY/PROBLEM LIST:  Includes:    Malignant hypertension/severe hypertension.  She has had a duplex ultrasound of her renal arteries that was negative.      Chronic renal failure stage III with a GFR of 30-59.    Paresthesias in the left hand.    Pre-diabetes.    E.coli UTI.    Left shoulder pain.    Hyperlipidemia.  On a statin.  I am not sure if this is primary or secondary prevention.    CURRENT MEDICATIONS:  Medication list is reviewed.    In terms of her hypertension, she is on Norvasc 10 q.d., atenolol 50 b.i.d., apresoline 50 four times a day.  She is also on clonidine patch 0.3 mg/24 hours once a week on Tuesdays.     In terms of stroke prophylaxis, she is on both Plavix 75 and aspirin 81.    She is on Zocor.    She is on Neurontin 300 mg at h.s.    SOCIAL HISTORY:   HOUSING:  The patient lives with her son and husband in [place].   FUNCTIONAL STATUS:  She was independent prior to admission, although she was having some voiding deficits and difficulties.     FAMILY HISTORY:  Positive for diabetes.  No history of  stroke.   SIBLINGS:  One sister died at age 43 of uncertain causes.  There is one sister and one brother who are alive and well.    REVIEW OF SYSTEMS:   HEENT:   EYES:  She complains of visual field loss.   HEAD:  No headache.   MOUTH/THROAT:   No swallowing difficulties.   CHEST/RESPIRATORY:  No shortness of breath.   CARDIAC:   No chest pain.   GI:  No abdominal pain.    GU:  She has urinary incontinence.   NEUROLOGICAL:   She is complaining of dysesthesias which she describes as a burning pain involving her left arm and left leg.  She was recently put on Neurontin, according to the patient.    PHYSICAL EXAMINATION:   GENERAL APPEARANCE:  Somewhat depressed-looking woman in no acute distress.   HEENT:   MOUTH/THROAT:   Mucous membranes are dry.   CHEST/RESPIRATORY:  Clear air entry bilaterally.   CARDIOVASCULAR:  CARDIAC:   Heart sounds are normal.  There are no carotid bruits.  No gallops.   GASTROINTESTINAL:  ABDOMEN:   Soft, nontender.   GENITOURINARY:  BLADDER:   Not distended.  NEUROLOGICAL:     CRANIAL NERVES:  This lady has a left homonymous hemianopsia.    SENSATION/STRENGTH:  She has paralysis of the left arm and left leg.  There is no overt sensory difference to light touch.  No active joints in the hands or in the legs.   PSYCHIATRIC:   MENTAL STATUS:   She is cognitively intact; however, is depressed.    ASSESSMENT/PLAN:  Extensive right posterior cerebral artery CVA with severe neurologic deficits.  She will continue on the Plavix and aspirin.    Refractory and history of malignant hypertension.  This will need to be monitored.  She is on an aggressive regimen as outlined above.    "Pre-diabetes".  She comes out on a sliding scale.  Hemoglobin A1c was on 10/04/2012 was 6.1.  On 04/09/2013, it was 6.4.    Question post stroke depression versus situational depression.  At this point, I think this needs to be followed.  However, she does have a past history of  depression, seeing a psychiatrist.  She may well need antidepressant therapy.    Urinary incontinence.  Her postvoid residual urine was 73 cc.  This does not need to be followed anymore aggressively than that.   Consider a bladder anticholinergic.    This is a patient with a fairly disastrous right posterior cerebral artery stroke.    I am going to discontinue the sliding scale insulin and follow her CBGs.    Blood pressure today is 146/55.  I will monitor this for now.  This also will need to be closely followed, as well as her renal insufficiency.     CPT CODE: 40347       ADDENDUM:  She also has a loop recorder in place that is being followed by Cardiology.  Presumably, this is to determine whether or not she has atrial fibrillation.  She has already had echocardiograms that showed good ejection fraction.  She had a noninvasive study that showed no evidence of ischemia.

## 2013-05-12 ENCOUNTER — Non-Acute Institutional Stay (SKILLED_NURSING_FACILITY): Payer: 59 | Admitting: Internal Medicine

## 2013-05-12 ENCOUNTER — Encounter: Payer: Self-pay | Admitting: Internal Medicine

## 2013-05-12 ENCOUNTER — Ambulatory Visit (HOSPITAL_COMMUNITY): Payer: 59 | Attending: Internal Medicine

## 2013-05-12 DIAGNOSIS — R0989 Other specified symptoms and signs involving the circulatory and respiratory systems: Secondary | ICD-10-CM | POA: Insufficient documentation

## 2013-05-12 DIAGNOSIS — I6350 Cerebral infarction due to unspecified occlusion or stenosis of unspecified cerebral artery: Secondary | ICD-10-CM

## 2013-05-12 DIAGNOSIS — R7303 Prediabetes: Secondary | ICD-10-CM

## 2013-05-12 DIAGNOSIS — R05 Cough: Secondary | ICD-10-CM | POA: Insufficient documentation

## 2013-05-12 DIAGNOSIS — R7309 Other abnormal glucose: Secondary | ICD-10-CM

## 2013-05-12 DIAGNOSIS — N289 Disorder of kidney and ureter, unspecified: Secondary | ICD-10-CM

## 2013-05-12 DIAGNOSIS — I10 Essential (primary) hypertension: Secondary | ICD-10-CM

## 2013-05-12 DIAGNOSIS — I639 Cerebral infarction, unspecified: Secondary | ICD-10-CM

## 2013-05-12 DIAGNOSIS — R059 Cough, unspecified: Secondary | ICD-10-CM | POA: Insufficient documentation

## 2013-05-12 LAB — GLUCOSE, CAPILLARY
GLUCOSE-CAPILLARY: 117 mg/dL — AB (ref 70–99)
Glucose-Capillary: 216 mg/dL — ABNORMAL HIGH (ref 70–99)

## 2013-05-12 NOTE — Progress Notes (Signed)
Patient ID: Kelli [name], female   DOB: 01/18/1951, 63 y.o.   MRN: [id number]   This is an acute visit.  Level of care skilled.  Facility [facility].  Chief complaint-acute visit secondary to chest congestion.  History of present illness.  Patient is a pleasant 63 year old female who is here for rehabilitation after sustaining a fairly severe CVA with resulting left-sided hemiparesis-this actually was her second CVA involving the right occipital lobe  posterior right temporal lobe midbrain and posterior thalamus-- Clinically she appears to have improved some apparently her appetite was initiated initially but this is doing better.  Nursing staff noted some increased chest congestion earlier today and we ordered nebulizers routinely as well as vital signs with O2 saturations every shift for 72 hours and also Robitussin every 6 hours when necessary.   a chest x-ray also was done which has co showing no acute process.  Patient does not complaining of any shortness of breath for much cough for that matter says that she just woke up earlier today feeling some chest congestion wheezing.  Her O2 saturations are in the 90s on room air.  Family medical social history as been reviewed per admission note on 05/09/2013.  Medications have been reviewed per MAR.  Review of systems.  General no complaints of fever or chills.  Oropharynx-does not complaining of sore throat  Respiratory--c/o  chest congestion after waking up today -- does not complaining of any shortness of breath says she feels she's wheezing a bit at times  Cardiac-no complaints of chest pain.  Neurologic does not complaining of dizziness or headache  does have this history of a significant CVA as noted above  Physical exam.  Temperature 98.0 pulse 69 respirations 18 blood pressure taken manually 160/70.  In general this is a pleasant female in no distress length of the bed.  Her skin is warm and dry.  Chest is clear to  auscultation there is no labored breathing.  Heart is regular rate and rhythm without murmur gallop or rub.  Abdomen-soft nontender with positive bowel sounds somewhat obese.  Neurologic-he does have left-sided hemiparesis with some edema of her extremities on that side especially lower extremity this is cool to touch nonerythematous nontender.  Psych she is alert and oriented x3 pleasant and appropriate.  Labs.  2- 27 2015.  Sodium 140 potassium 3.8 BUN 18 creatinine 1.29.  04/18/2013.  WBC 8.5 hemoglobin 12.0 platelets 279.  04/10/2013-liver function tests within normal limits except AST of 44 there may have been some hemolysis the lab sample  Assessment and plan.  #1-chest congestion-this appears to be stable exam was quite benign as well as chest x-ray-she does have Spirometry at bedside which should be encouraged-also when necessary Robitussin and we'll make the nebulizers when necessary per patient request--continue to monitor vital signs pulse ox every shift for 72 hours.  #2 history of hypertension-this has been quite refractory in the past per chart review in the patient she is on numerous agents including atenolol 25 mg twice a day-Norvasc 10 mg daily-hydralazine 50 mg 4 times a day-and clonidine patch 0.3 mg q. Weekly--- appears her systolics run from 0000000 to 170 at times-according to patient this has been a baseline for quite some time I do note per Dr. Janalyn Rouse note he is monitoring this for now I will discuss this with him about  possibly increasing the hydralazine although she is on a significant dose here-I suspect this will continue to be a challenge--  Number 3-history  of borderline diabetes I have reviewed her blood sugars they appeared around more in the low 100s of the ones that I see that had been obtained at this point we'll continue to monitor she currently is not on any agent.  . #4-history of CVA-this appears to be at baseline she is on aspirin as well as  Plavix she is working with therapy and apparently is doing relatively well  #5-history of renal insufficiency most recent creatinine I see is 1.29-essentially appears to be on the lower end of her baseline she does have a listed history of renal issues will update this as well as a CBC  also will check a liver function panel since she had an elevated AST although was thought this was possibly an inaccurate lab  [phone]

## 2013-05-13 LAB — GLUCOSE, CAPILLARY
GLUCOSE-CAPILLARY: 119 mg/dL — AB (ref 70–99)
GLUCOSE-CAPILLARY: 223 mg/dL — AB (ref 70–99)

## 2013-05-14 ENCOUNTER — Other Ambulatory Visit: Payer: Self-pay | Admitting: *Deleted

## 2013-05-14 LAB — GLUCOSE, CAPILLARY
Glucose-Capillary: 119 mg/dL — ABNORMAL HIGH (ref 70–99)
Glucose-Capillary: 227 mg/dL — ABNORMAL HIGH (ref 70–99)

## 2013-05-14 MED ORDER — HYDROCODONE-ACETAMINOPHEN 5-325 MG PO TABS: ORAL_TABLET | ORAL | Status: DC

## 2013-05-14 NOTE — Telephone Encounter (Signed)
[facility] 

## 2013-05-15 LAB — GLUCOSE, CAPILLARY
GLUCOSE-CAPILLARY: 181 mg/dL — AB (ref 70–99)
Glucose-Capillary: 118 mg/dL — ABNORMAL HIGH (ref 70–99)

## 2013-05-16 LAB — GLUCOSE, CAPILLARY: GLUCOSE-CAPILLARY: 114 mg/dL — AB (ref 70–99)

## 2013-05-17 LAB — GLUCOSE, CAPILLARY
GLUCOSE-CAPILLARY: 211 mg/dL — AB (ref 70–99)
Glucose-Capillary: 119 mg/dL — ABNORMAL HIGH (ref 70–99)
Glucose-Capillary: 171 mg/dL — ABNORMAL HIGH (ref 70–99)

## 2013-05-18 ENCOUNTER — Ambulatory Visit (INDEPENDENT_AMBULATORY_CARE_PROVIDER_SITE_OTHER): Payer: 59 | Admitting: *Deleted

## 2013-05-18 DIAGNOSIS — I6350 Cerebral infarction due to unspecified occlusion or stenosis of unspecified cerebral artery: Secondary | ICD-10-CM

## 2013-05-18 DIAGNOSIS — I639 Cerebral infarction, unspecified: Secondary | ICD-10-CM

## 2013-05-18 LAB — GLUCOSE, CAPILLARY
Glucose-Capillary: 129 mg/dL — ABNORMAL HIGH (ref 70–99)
Glucose-Capillary: 153 mg/dL — ABNORMAL HIGH (ref 70–99)

## 2013-05-19 ENCOUNTER — Encounter: Payer: Self-pay | Admitting: Internal Medicine

## 2013-05-19 ENCOUNTER — Non-Acute Institutional Stay (SKILLED_NURSING_FACILITY): Payer: 59 | Admitting: Internal Medicine

## 2013-05-19 DIAGNOSIS — N183 Chronic kidney disease, stage 3 unspecified: Secondary | ICD-10-CM

## 2013-05-19 DIAGNOSIS — M25569 Pain in unspecified knee: Secondary | ICD-10-CM

## 2013-05-19 DIAGNOSIS — I10 Essential (primary) hypertension: Secondary | ICD-10-CM

## 2013-05-19 DIAGNOSIS — R635 Abnormal weight gain: Secondary | ICD-10-CM

## 2013-05-19 LAB — GLUCOSE, CAPILLARY
GLUCOSE-CAPILLARY: 123 mg/dL — AB (ref 70–99)
GLUCOSE-CAPILLARY: 188 mg/dL — AB (ref 70–99)

## 2013-05-19 NOTE — Progress Notes (Signed)
Patient ID: Kelli [name], female   DOB: 1950-10-24, 63 y.o.   MRN: [id number]   This is an acute visit.  Level care skilled.  Facility [facility].  Chief complaint-acute visit secondary to weight gain.  History of present illness.  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  This is a complicated somewhat with a history of malignant hypertension she is on numerous agents including atenolol 50 mg twice a day-clonidine patch 0.3 mg q. weekly-Norvasc 10 mg a day-as well as hydralazine 50 mg 4 times a day.  Dietary has noted a weight gain of about 6 pounds the past week-apparently her appetite has been quite good and family brings her food -- apparently her appetite according to nursing has improved significantly during her stay here  She does not complaining of any increased shortness of breath or edema actually the edema on her left side appears to be somewhat improved today compared to last week.  Of note she does have a loop recorder in place that is followed by cardiology will write an order to ensure that this is being followed apparently this is to determine whether or not she has atrial fibrillation.  She's had previous echocardiograms that showed good ejection fraction and a noninvasive study that showed no evidence of ischemia.  Patient does have a history of renal insufficiency with a diagnoses of chronic renal failure stage III that she says she had the   understanding that nephrology was going to followup on this so will write an order for this  Most recent creatinine is 1.23--- which actually appears to be somewhat improved from her hospital values her creatinine appeared to range as high as 1.7.  Again she  appears to be apparently eating and drinking better recently.  She continues to complain at times of some right lower extremity pain she does have Norco when necessary and takes this frequently she is also on Neurontin 300 mg each bedtime-talking to nursing staff were concerned that increasing either of these doses may lead to increased sedation since patient occasionally appears to be a bit sleepy  In regard to her blood pressure this actually appears to be improved compared to last week recent blood pressure is 122/56-141/61-the highest I see and this is quite rare is a systolic of [id number] however there is also a 111/69 again with her history of malignant hypertension this actually appears to be improving family medical social history has been reviewed per admission note on 05/09/2013.  Medications have been reviewed per MAR.  Review of systems.  In general denies any fever or chills.  Respiratory no complaints of shortness of breath or cough.  Cardiac does not complaining of chest pain does have questionable atrial fibrillation with a loop recorder which will need followup.  GI does not complaining of any nausea vomiting diarrhea constipation apparently now has a good appetite does not complaining of abdominal pain.  Muscle skeletal does complain of some right lower extremity pain more so at night again she is on Neurontin and also received Norco when necessary.  Neurologic does not dizziness or headache does complain of occasional gripping what she believes is neuropathic pain left leg  Physical exam.  Temperature is 98.0 pulse 60 respirations 20 blood pressure as  noted above most recently 122/56 O2 saturation 96% on room air weight is 206.4-this is up about 6 pounds the past week.  In general this is a pleasant elderly female in no distress sitting comfortably in her wheelchair.  Her skin is warm and dry.  Chest is clear to auscultation no labored breathing.  Heart is regular rate  and rhythm without murmur gallop or rub she does have a loop recorder.  Abdomen soft nontender with positive bowel sounds.  Muscle skeletal continues with left-sided hemiparalysis there is edema of her left leg but this actually appears to be somewhat improved from last week's exam.  Neurologic in continues with left upper lower extremity paralysis with some edema.  Psych she is alert and oriented x3 pleasant and conversant.  Labs.  05/14/2013.  WBC 7.8 hemoglobin 9.1 platelets 213.  Sodium 142 potassium 3.6 BUN 16 creatinine 1.23.  Liver function tests within normal limits except albumin of 2.6 and bilirubin of 0.2.  Assessment and plan.  #1-weight gain-at this point clinically she appears stable apparently has a good appetite I suspect this is more appetite related with better by mouth intake continue to monitor this as her edema actually appears to be somewhat less than when I saw last week she does not complaining of any shortness of breath or chest pain-check weight tomorrow and notify provider gain greater than 2 pounds.  #2 history of renal insufficiency stage III chronic kidney disease-will write order for followup by nephrology.--This appears to be stable per recent labs  #3-question history of atrial fibrillation she does have a loop recorder will write an order to make sure cardiology is following this   4-pain management-challenging situation with nursing staff noting some concerns that she may be somewhat sedated with any pain medication increase-would be hesitant to increase her Neurontin secondary to this as well as renal issues ---Will monitor apparently this pain is more prominent at night at this point will monitor her certainly if this persists will readdress--she has Norco when necessary as well At this point monitor her closely and encourage repositioning  #5-hypertension this appears to be doing a bit better on current medications continue to  monitor [phone]

## 2013-05-20 ENCOUNTER — Ambulatory Visit (HOSPITAL_COMMUNITY): Payer: 59 | Attending: Internal Medicine

## 2013-05-20 ENCOUNTER — Encounter: Payer: Self-pay | Admitting: Internal Medicine

## 2013-05-20 ENCOUNTER — Non-Acute Institutional Stay (SKILLED_NURSING_FACILITY): Payer: 59 | Admitting: Internal Medicine

## 2013-05-20 DIAGNOSIS — M169 Osteoarthritis of hip, unspecified: Secondary | ICD-10-CM | POA: Insufficient documentation

## 2013-05-20 DIAGNOSIS — W19XXXA Unspecified fall, initial encounter: Secondary | ICD-10-CM

## 2013-05-20 DIAGNOSIS — M1610 Unilateral primary osteoarthritis, unspecified hip: Secondary | ICD-10-CM | POA: Insufficient documentation

## 2013-05-20 DIAGNOSIS — M25559 Pain in unspecified hip: Secondary | ICD-10-CM | POA: Insufficient documentation

## 2013-05-20 DIAGNOSIS — R635 Abnormal weight gain: Secondary | ICD-10-CM

## 2013-05-20 DIAGNOSIS — Y92129 Unspecified place in nursing home as the place of occurrence of the external cause: Secondary | ICD-10-CM

## 2013-05-20 DIAGNOSIS — Y929 Unspecified place or not applicable: Secondary | ICD-10-CM | POA: Insufficient documentation

## 2013-05-20 DIAGNOSIS — M79609 Pain in unspecified limb: Secondary | ICD-10-CM | POA: Insufficient documentation

## 2013-05-20 LAB — GLUCOSE, CAPILLARY
Glucose-Capillary: 109 mg/dL — ABNORMAL HIGH (ref 70–99)
Glucose-Capillary: 190 mg/dL — ABNORMAL HIGH (ref 70–99)

## 2013-05-20 NOTE — Progress Notes (Signed)
Patient ID: Kelli [name], female   DOB: 12-22-1950, 63 y.o.   MRN: [id number]   This is an acute visit.  Level of care skilled.  Facility [facility].  Chief complaint-acute visit followup fall with left hip discomfort.  History of present illness.  Patient is a pleasant 63 year old female with a complex medical history including a recent CVA with left-sided hemiparesis.  Apparently she slid off her wheelchair this evening and landed on the floor on her left hip--she is complaining of some pain with this.  Although apparently it is somewhat better now that she's lying in bed.  She does not complaining of any loss of consciousness with this apparently just slid out of her wheelchair.  She is receiving Norco 5-325 mg every 4 hours when necessary for pain.  I actually saw her yesterday for some weight gain of 6 pounds from March 10-until March 17-there was high suspicion this was secondary to her increased appetite I ordered weight for today which is 207 pounds which is  essentially unchanged since the 17th  He continues to not complaining of any increased shortness of breath or chest pain.  She has some baseline edema of her left leg again this appears roughly comparable to what I saw yesterday.  Family medical social history as been reviewed.  Medications have been reviewed per MAR.  Review of systems.  General does not complaining of fever chills.  Respiratory no complaints of shortness of breath or cough currently.  Cardiac no complaints of chest pain.  Muscle skeletal does complain of some left hip pain after the fall however she says it's somewhat better than it was immediately after she slid out of her wheelchair.  Physical exam.  Temperature is 98.0 pulse 73 respirations 18 blood pressure 151/53.  In general this is a somewhat obese elderly female in no distress I comfortably in bed.  Her skin is warm and dry.  Chest is clear to auscultation no labored breathing somewhat  shallow air entry.  Heart is regular rate and rhythm she has baseline left upper and lower extremity edema this does not appear changed from yesterday's exam it is cool to touch.  Muscle skeletal she does have left-sided hemiparesis with passive range of motion of her left hip there is slight discomfort I did not note any deformity or acute tenderness to palpation of the hip area.  Labs.  05/14/2013.  WBC 7.8 hemoglobin 9.1 platelets 213.  Sodium 142 potassium 3.6 BUN 16 creatinine 1.23.  Liver function tests within normal limits except albumin of 2.6-bilirubin of 0.2.  #1-left hip discomfort after fall-we'll order an x-ray of the area to rule out any acute process-she is receiving Norco 5-3 25 mg every 4 hours for pain there has been some concern about increasing her pain medication secondary to concerns of sedation will monitor this for now--.  #2 weight gain-this appears to have stabilized clinically she appears stable  will monitor  [phone]

## 2013-05-21 ENCOUNTER — Ambulatory Visit (INDEPENDENT_AMBULATORY_CARE_PROVIDER_SITE_OTHER): Payer: 59 | Admitting: Cardiology

## 2013-05-21 ENCOUNTER — Encounter: Payer: Self-pay | Admitting: Vascular Surgery

## 2013-05-21 ENCOUNTER — Encounter: Payer: Self-pay | Admitting: Cardiology

## 2013-05-21 VITALS — BP 143/57 | HR 73 | Ht 65.0 in | Wt 214.0 lb

## 2013-05-21 DIAGNOSIS — I10 Essential (primary) hypertension: Secondary | ICD-10-CM

## 2013-05-21 DIAGNOSIS — I639 Cerebral infarction, unspecified: Secondary | ICD-10-CM

## 2013-05-21 DIAGNOSIS — E782 Mixed hyperlipidemia: Secondary | ICD-10-CM

## 2013-05-21 DIAGNOSIS — I6350 Cerebral infarction due to unspecified occlusion or stenosis of unspecified cerebral artery: Secondary | ICD-10-CM

## 2013-05-21 LAB — GLUCOSE, CAPILLARY
GLUCOSE-CAPILLARY: 135 mg/dL — AB (ref 70–99)
GLUCOSE-CAPILLARY: 165 mg/dL — AB (ref 70–99)

## 2013-05-21 NOTE — Patient Instructions (Addendum)
Your physician wants you to follow-up in:  2 months with Dr.McDowell   Please return this Friday 3/27 at  2:20 pm with Melton Alar to have wound check and be enrolled in carelink program to monitor your devise   Your physician recommends that you continue on your current medications as directed. Please refer to the Current Medication list given to you today.    Thank you for choosing [facility] !

## 2013-05-21 NOTE — Assessment & Plan Note (Signed)
Systolic in the 0000000 today. No changes made to current regimen. She is tolerating atenolol which was our last adjustment, continues on Norvasc, clonidine, and hydralazine.

## 2013-05-21 NOTE — Assessment & Plan Note (Signed)
She continues on Zocor. LDL 90 as of February.

## 2013-05-21 NOTE — Progress Notes (Signed)
Clinical Summary Ms. Kwiat is a 63 y.o.female last seen in February of this year for management of hypertension. At that time she was switched from labetalol to atenolol, no other antihypertensive adjustments made.   I reviewed her interval records. She was later admitted to the hospital with nausea and dizziness, left-sided numbness and weakness. Brain MRI showed evidence of acute infarct in the left cerebellum. Discharge summary indicates that she had a recurrent event while hospitalized, confirmed by repeat MRI with territory involving the right PCA distribution. Carotid Dopplers showed patent left CEA site. [name] did not show a left atrial appendage thrombus, but did define extensive grade 4 aortic atheroma as a potential source of embolus. She was treated with aspirin and Plavix, implantable loop recorder was also placed by Dr. Lovena Le. She has been undergoing rehabilitation, in a nursing facility.  She is currently in the [facility] undergoing rehabilitation. She is present with two other family members today. They do voice some concerns about her stay in the nursing facility, I have asked them to make sure that they address these with a care coordinator at the [facility]. Ms. Fails does tell me that she thinks she has gotten better, although it does not look like she is ready to go home due to very limited ambulation. She is in a wheelchair today, states that she has had pain in her right arm and also some tightness in her left leg. Chest congestion with intermittent cough also noted. No palpitations.  Recent chest x-ray showed no obvious acute findings based on report. Left hip and femur films reportedly negative for fracture as well.  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 Lexiscan Cardiolite done  recently demonstrated no evidence of active ischemia with LVEF 71%.   Today I reviewed her antihypertensive regimen, also discussed her implantable loop recorder that was put in by Dr. Lovena Le. She has no history of arrhythmia, and will have this device followed intermittently over time to exclude any potential problems.   Allergies  Allergen Reactions  . Hydrochlorothiazide Nausea And Vomiting    Current Outpatient Prescriptions on File Prior to Visit  Medication Sig Dispense Refill  . acetaminophen (TYLENOL) 325 MG tablet Take 1-2 tablets (325-650 mg total) by mouth every 4 (four) hours as needed for mild pain.      Marland Kitchen alum & mag hydroxide-simeth (MAALOX/MYLANTA) 200-200-20 MG/5ML suspension Take 30 mLs by mouth every 4 (four) hours as needed for indigestion.  355 mL  0  . amLODipine (NORVASC) 10 MG tablet Take 10 mg by mouth daily.      Marland Kitchen aspirin EC 81 MG tablet Take 81 mg by mouth every morning.      Marland Kitchen atenolol (TENORMIN) 25 MG tablet Take 2 tablets (50 mg total) by mouth 2 (two) times daily.  180 tablet  1  . clonazePAM (KLONOPIN) 0.5 MG tablet Take 1/2 tablet to (2) half tablets by mouth twice daily as needed for anxiety. Note half tablets  30 tablet  5  . cloNIDine (CATAPRES - DOSED IN MG/24 HR) 0.3 mg/24hr patch Place 0.3 mg onto the skin once a week. On Tuesday      . clopidogrel (PLAVIX) 75 MG tablet Take 1 tablet (75 mg total) by mouth daily with breakfast.      . diclofenac sodium (  VOLTAREN) 1 % GEL Apply 2 g topically 4 (four) times daily. To left shoulder      . gabapentin (NEURONTIN) 300 MG capsule Take 1 capsule (300 mg total) by mouth at bedtime.      . hydrALAZINE (APRESOLINE) 50 MG tablet Take 1 tablet (50 mg total) by mouth 4 (four) times daily.  270 tablet  3  . HYDROcodone-acetaminophen (NORCO/VICODIN) 5-325 MG per tablet Take one to two tablets by mouth every 4 hours as needed for moderate pain  360 tablet  0  . insulin aspart (NOVOLOG) 100 UNIT/ML injection Inject 0-5  Units into the skin at bedtime.  10 mL  11  . insulin aspart (NOVOLOG) 100 UNIT/ML injection Inject 0-9 Units into the skin 3 (three) times daily with meals.  10 mL  11  . polyethylene glycol (MIRALAX / GLYCOLAX) packet Take 17 g by mouth 2 (two) times daily.  14 each  0  . senna-docusate (SENOKOT-S) 8.6-50 MG per tablet Take 2 tablets by mouth 2 (two) times daily.      . simvastatin (ZOCOR) 10 MG tablet Take 1 tablet (10 mg total) by mouth daily at 6 PM.  30 tablet  1   No current facility-administered medications on file prior to visit.    Past Medical History  Diagnosis Date  . Essential hypertension, benign   . Fibromyalgia   . Cerebral infarction Aug 2014    Bihemispheric watershed infarcts  . Mixed hyperlipidemia   . Borderline diabetes   . Urinary incontinence   . Carotid artery occlusion     Occluded RICA, status post left CEA  August 2014 - Dr. Donnetta Hutching  . CKD (chronic kidney disease) stage 3, GFR 30-59 ml/min   . 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 Moroney reports that she quit smoking about 7 months ago. Her smoking use included Cigarettes. She has a 44 pack-year smoking history. She has never used smokeless tobacco. Ms. Thornberg reports that she does not drink alcohol.  Review of Systems No obvious fevers or chills. Appetite is fair. Otherwise as outlined above.  Physical Examination Filed Vitals:   05/21/13 1145  BP: 143/57  Pulse: 73   Filed Weights   05/21/13 1145  Weight: 214 lb (97.07 kg)    Chronically ill-appearing woman, seated in a wheelchair.  HEENT: Conjunctiva and lids normal, oropharynx clear.  Neck: Supple, no elevated JVP, bilateral carotid bruits, no thyromegaly.  Lungs: Decreased breath sounds, no wheezing, nonlabored breathing at rest.  Cardiac: Regular rate and rhythm, no S3, soft systolic murmur, no pericardial rub.  Abdomen: Soft, nontender, bowel sounds present, no  guarding or rebound.  Extremities: 1+ edema, distal pulses 1-2+.  Skin: Warm and dry. Musculoskeletal: No kyphosis. Neuropsychiatric: Patient alert and oriented x3, left hemiparesis.   Problem List and Plan   CVA (cerebral infarction) Patient status post stroke as outlined above. Dr. Leonie Man is listed as her neurologist. I made it clear to the patient and her family that she needs tot keep followup with neurology, her primary care provider, and also continue to work on rehabilitation. She has an implantable loop recorder in place to exclude any occult arrhythmias, and we are making sure that this has appropriate followup scheduled through the device clinic with Dr. Lovena Le. She otherwise continues on aspirin and Plavix, no left atrial appendage thrombus by [name], however significant aortic atheromatous disease which could potentially be a  source of embolus.  Mixed hyperlipidemia She continues on Zocor. LDL 90 as of February.  HTN (hypertension) Systolic in the 0000000 today. No changes made to current regimen. She is tolerating atenolol which was our last adjustment, continues on Norvasc, clonidine, and hydralazine.    Satira Sark, M.D., F.A.C.C.

## 2013-05-21 NOTE — Assessment & Plan Note (Signed)
Patient status post stroke as outlined above. Dr. Leonie Man is listed as her neurologist. I made it clear to the patient and her family that she needs tot keep followup with neurology, her primary care provider, and also continue to work on rehabilitation. She has an implantable loop recorder in place to exclude any occult arrhythmias, and we are making sure that this has appropriate followup scheduled through the device clinic with Dr. Lovena Le. She otherwise continues on aspirin and Plavix, no left atrial appendage thrombus by [name], however significant aortic atheromatous disease which could potentially be a source of embolus.

## 2013-05-22 ENCOUNTER — Ambulatory Visit (INDEPENDENT_AMBULATORY_CARE_PROVIDER_SITE_OTHER): Payer: 59 | Admitting: Vascular Surgery

## 2013-05-22 ENCOUNTER — Encounter: Payer: Self-pay | Admitting: Vascular Surgery

## 2013-05-22 ENCOUNTER — Ambulatory Visit (HOSPITAL_COMMUNITY)
Admission: RE | Admit: 2013-05-22 | Discharge: 2013-05-22 | Disposition: A | Discharge status: Home / Self Care | Payer: 59 | Source: Ambulatory Visit | Attending: Vascular Surgery | Admitting: Vascular Surgery

## 2013-05-22 VITALS — BP 165/59 | HR 61 | Ht 65.0 in | Wt 214.0 lb

## 2013-05-22 DIAGNOSIS — Z48812 Encounter for surgical aftercare following surgery on the circulatory system: Secondary | ICD-10-CM

## 2013-05-22 DIAGNOSIS — I6529 Occlusion and stenosis of unspecified carotid artery: Secondary | ICD-10-CM | POA: Insufficient documentation

## 2013-05-22 DIAGNOSIS — I658 Occlusion and stenosis of other precerebral arteries: Secondary | ICD-10-CM

## 2013-05-22 DIAGNOSIS — I6523 Occlusion and stenosis of bilateral carotid arteries: Secondary | ICD-10-CM

## 2013-05-22 LAB — GLUCOSE, CAPILLARY: Glucose-Capillary: 122 mg/dL — ABNORMAL HIGH (ref 70–99)

## 2013-05-22 NOTE — Progress Notes (Signed)
Reinforcement the patient here today for followup of her left carotid endarterectomy on 10/06/2012. She presented to the hospital with an acute stroke and was found to have right internal carotid artery occlusion and critical stenosis of her left internal carotid artery. She also was found to have retrograde flow in her left vertebral artery. She underwent uneventful left endarterectomy. He is continuing to have repeated neurologic deficits. Most recently this was one month ago. She was in the hospital for stroke and then was discharged to a nursing facility after rehabilitation stay. She is here today with the family member. She is a very sedentary for regarding her ongoing neurologic deficits. She had bilateral cataract surgery several months ago for improvement in her eyesight and is now had lost a portion of her eyesight due to the recent stroke. 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  Past Medical History  Diagnosis Date  . Essential hypertension, benign   . Fibromyalgia   . Cerebral infarction Aug 2014    Bihemispheric watershed infarcts  . Mixed hyperlipidemia   . Borderline diabetes   . Urinary incontinence   . Carotid artery occlusion     Occluded RICA, status post left CEA  August 2014 - Dr. Donnetta Hutching  . CKD (chronic kidney disease) stage 3, GFR 30-59 ml/min   . 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 Stroke     History  Substance Use Topics  . Smoking status: Former Smoker -- 1.00 packs/day for 44 years    Types: Cigarettes    Quit date: 10/03/2012  . Smokeless tobacco: Never Used  . Alcohol Use: No    Family History  Problem Relation Age of Onset  . Diabetes Mother   . Diabetes Father   . Hypertension Sister   . Diabetes Brother   . Seizures Son   . Kidney disease Maternal Grandmother   . Hypertension Maternal Grandmother   . Heart disease  Maternal Grandfather   . Diabetes Paternal Grandmother   . Heart disease Paternal Grandfather     Allergies  Allergen Reactions  . Hydrochlorothiazide Nausea And Vomiting    Current outpatient prescriptions:clonazePAM (KLONOPIN) 0.5 MG tablet, Take 1/2 tablet to (2) half tablets by mouth twice daily as needed for anxiety. Note half tablets, Disp: 30 tablet, Rfl: 5;  HYDROcodone-acetaminophen (NORCO/VICODIN) 5-325 MG per tablet, Take one to two tablets by mouth every 4 hours as needed for moderate pain, Disp: 360 tablet, Rfl: 0  BP 165/59  Pulse 61  Ht 5\' 5"  (1.651 m)  Wt 214 lb (97.07 kg)  BMI 35.61 kg/m2  SpO2 98%  Body mass index is 35.61 kg/(m^2).       Physical exam the marked weakness in her left arm and left leg. She is alert and very tearful and upset but the appropriate Left neck incision is well-healed she has no bruits bilaterally Respirations are nonlabored  Carotid duplex today was reviewed with the patient. This does show occluded right internal carotid artery and no significant stenosis in her left endarterectomy site.  Impression and plan stable from the carotid bifurcation stent on the left. Recurrent strokes. Most recently cerebellar based on her a duplex. Unfortunately nothing further to add periods continued to have workup for possible cardiogenic source of these multiple strokes. We will see her in one year for continued followup of her endarterectomy

## 2013-05-22 NOTE — Addendum Note (Signed)
Addended by: Dorthula Rue L on: 05/22/2013 04:46 PM   Modules accepted: Orders

## 2013-05-23 LAB — GLUCOSE, CAPILLARY
GLUCOSE-CAPILLARY: 135 mg/dL — AB (ref 70–99)
Glucose-Capillary: 243 mg/dL — ABNORMAL HIGH (ref 70–99)
Glucose-Capillary: 240 mg/dL — ABNORMAL HIGH (ref 70–99)

## 2013-05-24 LAB — GLUCOSE, CAPILLARY
Glucose-Capillary: 136 mg/dL — ABNORMAL HIGH (ref 70–99)
Glucose-Capillary: 200 mg/dL — ABNORMAL HIGH (ref 70–99)

## 2013-05-25 ENCOUNTER — Ambulatory Visit (INDEPENDENT_AMBULATORY_CARE_PROVIDER_SITE_OTHER): Payer: 59 | Admitting: *Deleted

## 2013-05-25 ENCOUNTER — Encounter: Payer: Self-pay | Admitting: Internal Medicine

## 2013-05-25 DIAGNOSIS — I639 Cerebral infarction, unspecified: Secondary | ICD-10-CM

## 2013-05-25 DIAGNOSIS — I6350 Cerebral infarction due to unspecified occlusion or stenosis of unspecified cerebral artery: Secondary | ICD-10-CM

## 2013-05-25 LAB — GLUCOSE, CAPILLARY
GLUCOSE-CAPILLARY: 137 mg/dL — AB (ref 70–99)
Glucose-Capillary: 137 mg/dL — ABNORMAL HIGH (ref 70–99)

## 2013-05-25 LAB — MDC_IDC_ENUM_SESS_TYPE_INCLINIC

## 2013-05-25 NOTE — Progress Notes (Signed)
Wound check ILR in office.

## 2013-05-26 LAB — GLUCOSE, CAPILLARY
Glucose-Capillary: 124 mg/dL — ABNORMAL HIGH (ref 70–99)
Glucose-Capillary: 160 mg/dL — ABNORMAL HIGH (ref 70–99)

## 2013-05-27 LAB — GLUCOSE, CAPILLARY
GLUCOSE-CAPILLARY: 122 mg/dL — AB (ref 70–99)
GLUCOSE-CAPILLARY: 239 mg/dL — AB (ref 70–99)

## 2013-05-28 ENCOUNTER — Encounter: Payer: Self-pay | Admitting: Internal Medicine

## 2013-05-28 ENCOUNTER — Non-Acute Institutional Stay (SKILLED_NURSING_FACILITY): Payer: 59 | Admitting: Internal Medicine

## 2013-05-28 DIAGNOSIS — M25569 Pain in unspecified knee: Secondary | ICD-10-CM

## 2013-05-28 DIAGNOSIS — R609 Edema, unspecified: Secondary | ICD-10-CM

## 2013-05-28 LAB — GLUCOSE, CAPILLARY
GLUCOSE-CAPILLARY: 181 mg/dL — AB (ref 70–99)
Glucose-Capillary: 113 mg/dL — ABNORMAL HIGH (ref 70–99)

## 2013-05-28 NOTE — Progress Notes (Signed)
Patient ID: Kelli [name], female   DOB: 1950/04/06, 63 y.o.   MRN: [id number]   this is an acute visit.  Level of care skilled.  Facility [facility].  Chief complaint acute visit secondary to left knee discomfort.  History of present illness.  Patient is a 63 year old female with a complex medical history including a recent CVA with left-sided hemiparesis.  I recently saw her after she slid off her wheelchair and landed on the floor and complained of left hip pain that x-ray did not show any acute process.  Today however she is complaining of some left knee pain and thinks it is a bit swollen.  Apparently there've been no additional falls that I am aware of.  She continues to have some chronic edema of her left leg again this is the affected side from the CVA.  Family medical social history has been reviewed  per admission note on 05/09/2013.  Medications have been reviewed per MAR.  Review of systems.  Gen. no complaints of fever or chills.  Her weight is not complaining of any increased shortness of breath or increased cough.  Cardiac does not complaining of chest pain.  Muscle skeletal is complaining of some left knee pain she does take Norco apparently with some relief.  Neurologic-does not complaining of numbness or tingling she does have left-sided hemiparesis status post CVA.  Physical exam.  Temperature is 97.1 pulse 88 respirations 20 blood pressure somewhat variable most recently 171/62 .  In general this is a somewhat obese elderly female in no distress sitting comfortably in her wheelchair.  Her skin is warm and dry.  Chest is clear to auscultation without labored breathing.  Heart is regular rate and rhythm without murmur gallop or rub she does have some left upper and lower extremity edema this does not appear grossly changed from baseline-in regards to her left knee there is some pain with flexion and extension with limited passive range of motion.  There is  some edema and some mild tenderness to palpation inferior to the knee area.  She does have edema of her foot although could feel a slight pedal pulse.  Muscle skeletal she does continue with left-sided hemiparesis which is her baseline.  Labs.  05/14/2013.  WBC 7.8 hemoglobin 9.1 platelets 213.  Sodium 142 potassium 3.6 BUN 16 creatinine 1.23.  Liver function tests within normal limits except albumin of 2.6.  Assessment and plan.  #1 left knee discomfort-will order an x-ray also because of the edema the left leg would like a venous Doppler of the area to rule out any DVT  Clinically she appears stable.  Of note would like update labs next lab data including a CBC and basic metabolic panel with her history of anemia and what appears to be some  renal insufficiency.  [id number]

## 2013-05-29 ENCOUNTER — Ambulatory Visit (HOSPITAL_COMMUNITY): Payer: 59 | Attending: Internal Medicine

## 2013-05-29 ENCOUNTER — Ambulatory Visit (HOSPITAL_COMMUNITY)
Admit: 2013-05-29 | Discharge: 2013-05-29 | Disposition: A | Discharge status: Home / Self Care | Payer: 59 | Source: Skilled Nursing Facility | Attending: Internal Medicine | Admitting: Internal Medicine

## 2013-05-29 DIAGNOSIS — M25569 Pain in unspecified knee: Secondary | ICD-10-CM | POA: Insufficient documentation

## 2013-05-29 DIAGNOSIS — M25469 Effusion, unspecified knee: Secondary | ICD-10-CM | POA: Insufficient documentation

## 2013-05-29 DIAGNOSIS — M7989 Other specified soft tissue disorders: Secondary | ICD-10-CM | POA: Insufficient documentation

## 2013-05-29 DIAGNOSIS — Z8673 Personal history of transient ischemic attack (TIA), and cerebral infarction without residual deficits: Secondary | ICD-10-CM | POA: Insufficient documentation

## 2013-05-29 LAB — GLUCOSE, CAPILLARY: Glucose-Capillary: 132 mg/dL — ABNORMAL HIGH (ref 70–99)

## 2013-05-30 LAB — GLUCOSE, CAPILLARY
Glucose-Capillary: 210 mg/dL — ABNORMAL HIGH (ref 70–99)
Glucose-Capillary: 127 mg/dL — ABNORMAL HIGH (ref 70–99)
Glucose-Capillary: 158 mg/dL — ABNORMAL HIGH (ref 70–99)

## 2013-05-31 LAB — GLUCOSE, CAPILLARY
Glucose-Capillary: 110 mg/dL — ABNORMAL HIGH (ref 70–99)
Glucose-Capillary: 229 mg/dL — ABNORMAL HIGH (ref 70–99)

## 2013-06-01 LAB — GLUCOSE, CAPILLARY
GLUCOSE-CAPILLARY: 195 mg/dL — AB (ref 70–99)
Glucose-Capillary: 141 mg/dL — ABNORMAL HIGH (ref 70–99)

## 2013-06-02 LAB — GLUCOSE, CAPILLARY
GLUCOSE-CAPILLARY: 125 mg/dL — AB (ref 70–99)
Glucose-Capillary: 206 mg/dL — ABNORMAL HIGH (ref 70–99)

## 2013-06-03 LAB — GLUCOSE, CAPILLARY
GLUCOSE-CAPILLARY: 132 mg/dL — AB (ref 70–99)
Glucose-Capillary: 84 mg/dL (ref 70–99)

## 2013-06-04 ENCOUNTER — Inpatient Hospital Stay: Payer: 59 | Admitting: Physical Medicine & Rehabilitation

## 2013-06-04 LAB — GLUCOSE, CAPILLARY
Glucose-Capillary: 121 mg/dL — ABNORMAL HIGH (ref 70–99)
Glucose-Capillary: 121 mg/dL — ABNORMAL HIGH (ref 70–99)

## 2013-06-05 LAB — GLUCOSE, CAPILLARY
Glucose-Capillary: 140 mg/dL — ABNORMAL HIGH (ref 70–99)
Glucose-Capillary: 198 mg/dL — ABNORMAL HIGH (ref 70–99)

## 2013-06-06 LAB — GLUCOSE, CAPILLARY
GLUCOSE-CAPILLARY: 147 mg/dL — AB (ref 70–99)
Glucose-Capillary: 194 mg/dL — ABNORMAL HIGH (ref 70–99)

## 2013-06-07 LAB — GLUCOSE, CAPILLARY
Glucose-Capillary: 169 mg/dL — ABNORMAL HIGH (ref 70–99)
Glucose-Capillary: 167 mg/dL — ABNORMAL HIGH (ref 70–99)

## 2013-06-08 LAB — GLUCOSE, CAPILLARY: Glucose-Capillary: 139 mg/dL — ABNORMAL HIGH (ref 70–99)

## 2013-06-09 LAB — GLUCOSE, CAPILLARY
GLUCOSE-CAPILLARY: 182 mg/dL — AB (ref 70–99)
Glucose-Capillary: 138 mg/dL — ABNORMAL HIGH (ref 70–99)

## 2013-06-10 LAB — GLUCOSE, CAPILLARY
Glucose-Capillary: 117 mg/dL — ABNORMAL HIGH (ref 70–99)
Glucose-Capillary: 160 mg/dL — ABNORMAL HIGH (ref 70–99)

## 2013-06-11 ENCOUNTER — Non-Acute Institutional Stay (SKILLED_NURSING_FACILITY): Payer: 59 | Admitting: Internal Medicine

## 2013-06-11 DIAGNOSIS — I69959 Hemiplegia and hemiparesis following unspecified cerebrovascular disease affecting unspecified side: Secondary | ICD-10-CM

## 2013-06-11 DIAGNOSIS — F339 Major depressive disorder, recurrent, unspecified: Secondary | ICD-10-CM

## 2013-06-11 LAB — GLUCOSE, CAPILLARY
GLUCOSE-CAPILLARY: 129 mg/dL — AB (ref 70–99)
Glucose-Capillary: 178 mg/dL — ABNORMAL HIGH (ref 70–99)

## 2013-06-12 DIAGNOSIS — Z0279 Encounter for issue of other medical certificate: Secondary | ICD-10-CM

## 2013-06-12 LAB — GLUCOSE, CAPILLARY
GLUCOSE-CAPILLARY: 130 mg/dL — AB (ref 70–99)
GLUCOSE-CAPILLARY: 161 mg/dL — AB (ref 70–99)

## 2013-06-13 LAB — GLUCOSE, CAPILLARY
Glucose-Capillary: 155 mg/dL — ABNORMAL HIGH (ref 70–99)
Glucose-Capillary: 138 mg/dL — ABNORMAL HIGH (ref 70–99)

## 2013-06-14 LAB — GLUCOSE, CAPILLARY
Glucose-Capillary: 126 mg/dL — ABNORMAL HIGH (ref 70–99)
Glucose-Capillary: 195 mg/dL — ABNORMAL HIGH (ref 70–99)

## 2013-06-14 NOTE — Progress Notes (Signed)
Patient ID: Kelli [name], female   DOB: 04-07-1950, 63 y.o.   MRN: [id number]                   PROGRESS NOTE  DATE:  06/11/2013    FACILITY: [facility]    LEVEL OF CARE:   SNF   Acute Visit   HISTORY OF PRESENT ILLNESS:  This is a 63 year-old woman who came in after a stay at [facility] from 04/08/2013 through 04/17/2013.  She spent two weeks after this at rehabilitation.    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    Staff have noted increasing problems with edema.    I had commented on depression when she first came in.  However, she has not really improved.  She was recently started on Cymbalta up to 40 mg per day.    REVIEW OF SYSTEMS:   CHEST/RESPIRATORY:  The patient is not complaining of shortness of breath.   CARDIAC:   No chest pain.   GI:  She is complaining of nausea without vomiting (dry heaves).   GU:  She is complaining of some dysuria.      PHYSICAL EXAMINATION:   GENERAL APPEARANCE:  Unhappy-looking woman, but not in any distress.  She does not look much different than what I am used to seeing.   CHEST/RESPIRATORY:  Clear air entry bilaterally.   CARDIOVASCULAR:  CARDIAC:  Heart sounds are normal.  There are no gallops.   GASTROINTESTINAL:  LIVER/SPLEEN/KIDNEYS:  No liver, no spleen.  No tenderness.   GENITOURINARY:  BLADDER:   Perhaps some suprapubic tenderness, but no costovertebral angle tenderness.   NEUROLOGICAL:   Not much different.   SENSATION/STRENGTH:   She has dense left arm and left leg paralysis.   CIRCULATION:   EDEMA/VARICOSITIES:  Some edema is noted.  However, I think this is simply just dependent edema of her paralyzed side at this point.  There is a scant amount of edema up to her ankle and some in her hand, as well.  I think this should respond to simple measures like elevation.  There is no  evidence of a DVT.    ASSESSMENT/PLAN:  Late-effect right hemisphere CVA as described.    Post stroke depression.  She has been started on Cymbalta and I agree with this.    Nausea.  I would wonder about a UTI here.  She is complaining of some dysuria.   We will check a urine on her.    CPT CODE: 57846

## 2013-06-15 ENCOUNTER — Encounter: Payer: Self-pay | Admitting: Internal Medicine

## 2013-06-15 ENCOUNTER — Non-Acute Institutional Stay (SKILLED_NURSING_FACILITY): Payer: 59 | Admitting: Internal Medicine

## 2013-06-15 DIAGNOSIS — F32A Depression, unspecified: Secondary | ICD-10-CM

## 2013-06-15 DIAGNOSIS — I639 Cerebral infarction, unspecified: Secondary | ICD-10-CM

## 2013-06-15 DIAGNOSIS — F3289 Other specified depressive episodes: Secondary | ICD-10-CM

## 2013-06-15 DIAGNOSIS — N289 Disorder of kidney and ureter, unspecified: Secondary | ICD-10-CM

## 2013-06-15 DIAGNOSIS — I10 Essential (primary) hypertension: Secondary | ICD-10-CM

## 2013-06-15 DIAGNOSIS — I6350 Cerebral infarction due to unspecified occlusion or stenosis of unspecified cerebral artery: Secondary | ICD-10-CM

## 2013-06-15 DIAGNOSIS — N390 Urinary tract infection, site not specified: Secondary | ICD-10-CM

## 2013-06-15 DIAGNOSIS — F329 Major depressive disorder, single episode, unspecified: Secondary | ICD-10-CM

## 2013-06-15 LAB — GLUCOSE, CAPILLARY
Glucose-Capillary: 143 mg/dL — ABNORMAL HIGH (ref 70–99)
Glucose-Capillary: 173 mg/dL — ABNORMAL HIGH (ref 70–99)

## 2013-06-15 NOTE — Progress Notes (Signed)
Patient ID: CALLE RENTSCHLER, female   DOB: Nov 20, 1950, 63 y.o.   MRN: [id number]   FACILITY: [facility]  LEVEL OF CARE: SNF  HISTORY OF PRESENT ILLNESS: This is a 63 year-old woman who has a past history of a left cerebellar stroke who made a good recovery, living with her husband and son in her home near [place], [place].  She was admitted to hospital from 04/08/2013 through 04/17/2013 with acute onset of a right posterior cerebral artery infarct. This left extensive damage in the right occipital lobe, the mid and posterior right temporal lobe, mid brain and posterior right thalamus. There was no hemorrhage. She was discharged to [facility] inpatient rehab where she was from 04/17/2013 through 05/07/2013 and was sent here for ongoing care.  She continues with dense left-sided hemiparesis-and has worked with therapy she will need home health PTOT and nursing support at she does have a history of diabetes type 2 blood sugars run largely in the mid 100s quite consistently home secondary to her continued deficits.  She also will have 24-hour care at home which I feel is needed.  Her stay here initially was complicated somewhat with signs of depression she was started on Cymbalta and this appears to help.  She is looking forward to going home although she does have some nervousness about this.  She does have a supportive husband who will be with her as well.  Her other medical issues appear to be relatively stable  She does have a history of pre-diabetes blood sugars run largely in the mid 100s this appears to be diet controlled.  She also had complained of some nausea and dysuria which apparently is improved she has been put on a course of amoxicillin.  Blood pressure continues to be a challenge at times apparently her blood pressure goes down when she has her medications she is on numerous agents including atenolol 50 mg twice a day clonidine 0.3 mg patch once a week.  Also  hydralazine 50 mg 4 times a Norvasc 10 mg a day blood pressures recently run 128/52-166-68-t occasionally systolics in the [id number] but these appear to be relatively rare and apparently come down with medication.  She does not complaining of any headache or dizziness.  Marland Kitchen  PAST MEDICAL HISTORY/PROBLEM LIST: Includes:  Malignant hypertension/severe hypertension. She has had a duplex ultrasound of her renal arteries that was negative.  Chronic renal failure stage III with a GFR of 30-59.  Paresthesias in the left hand.  Pre-diabetes.  E.coli UTI.  Left shoulder pain.  Hyperlipidemia. On a statin. I am not sure if this is primary or secondary prevention.  CURRENT MEDICATIONS: Medication list is reviewed.  In terms of her hypertension, she is on Norvasc 10 q.d., atenolol 50 b.i.d., apresoline 50 four times a day. She is also on clonidine patch 0.3 mg/24 hours once a week on Tuesdays.  In terms of stroke prophylaxis, she is on both Plavix 75 and aspirin 81.  She is on Zocor.  She is on Neurontin 300 mg at h.s.  SOCIAL HISTORY:  HOUSING: The patient lives with her son and husband in [place].  FUNCTIONAL STATUS: She was independent prior to admission, although she was having some voiding deficits and difficulties.  FAMILY HISTORY: Positive for diabetes. No history of stroke.  SIBLINGS: One sister died at age 42 of uncertain causes. There is one sister and one brother who are alive and well.   REVIEW OF SYSTEMS Gen. no complaints  of fever chills In does not complaining of rashes or itching :  HEENT:  EYES: She complains of visual field loss.  HEAD: No headache.  MOUTH/THROAT: No swallowing difficulties.  CHEST/RESPIRATORY: No shortness of breath. Or cough CARDIAC: No chest painhas baseline edema.  GI: No abdominal pain. Nausea vomiting diarrhea or constipation  GU: She has urinary incontinence says her dysuria is improved currently being treated for UTI .  NEUROLOGICAL: had complained of  some burning pain on her left side but apparently this has gotten better she is on Neurontin Psychiatric history depression this appears to have improved on the Cymbalta .  PHYSICAL EXAMINATION :Temperature 98.8 pulse 62 respirations 20 blood pressure 128/52.     GENERAL APPEARANCE: pleasant elderly female in no distress sitting comfortably in her wheelchair   HEENT:  MOUTH/THROAT: Mucous membranes are moist.  CHEST/RESPIRATORY: Clear air entry bilaterally--no labored breathing.  CARDIOVASCULAR:  CARDIAC: Heart sounds are normal. There are no carotid bruits. No gallops.--Continues with lower extremity edema greater on the left side which is not new --this edema involves her arm as well as leg   GASTROINTESTINAL:  ABDOMEN: Soft, nontender with positive bowel sounds .  GENITOURINARY:  BLADDER: Not distended.  NEUROLOGICAL:  CRANIAL NERVES has a left homonymous hemianopsia.  SENSATION/STRENGTH: She has paralysis of the left arm and left leg. There is no overt sensory difference to light touch. No active joints in the hands or in the legs--currently she has her left arm supported on her wheelchair.  PSYCHIATRIC:  MENTAL STATUS: She is cognitively intact; wasn't an appropriate.  Labs.  05/30/2013.  WBC 8.0 hemoglobin 9.9 platelets 311.  Sodium 141 potassium 3.7 BUN 17 creatinine 1.25.  06/12/2013.  TSH-1.274-vitamin B12 481-vitamin D 14.    ASSESSMENT/PLAN:  Extensive right posterior cerebral artery CVA with severe neurologic deficits. continues on Plavix and aspirin she will need continued PT and OT as well as nursing support secondary to her significant weaknessr-also will need a wheelchair to perform her ADLs from a seated position.  Also will need a hospital bed for positioning and needs a rail for turning again she has significant dense left-sided hemiparesis here  Refractory and history of malignant hypertension. . She is on an aggressive regimen as outlined above--continues  with variable systolic she says this has been a challenge for some time-will need followup by primary care provider.  "Pre-diabetes". She comes out on a sliding scale. Hemoglobin A1c was on 10/04/2012 was 6.1. On 04/09/2013, it was 6.4.-- CBG's   have run in the mid 100s   Question post stroke depression versus situational depression. Cymbalta appears to be helping    History UTI-she is completing course of amoxicillin this appears to be doing better--the organism was Escherichia coli.  History of renal insufficiency this appears to be relatively stable will update a metabolic panel tomorrow before discharge.  Anemia also will update a CBC.  For pain management patient is receiving Vicodin apparently with good relief.  History of anxiety she is on Klonopin when necessary which apparently is helping as well.  She also continues on trazodone for a history of insomnia.  .  This is a patient with a fairly disastrous right posterior cerebral artery stroke.--She will need extensive support as noted above  .  CPT CODE: 99316--of note greater than 30 minutes spent preparing this discharge summary-more than 50% of time spent coordinating plan of care

## 2013-06-16 LAB — GLUCOSE, CAPILLARY: Glucose-Capillary: 130 mg/dL — ABNORMAL HIGH (ref 70–99)

## 2013-06-18 ENCOUNTER — Ambulatory Visit (INDEPENDENT_AMBULATORY_CARE_PROVIDER_SITE_OTHER): Payer: 59 | Admitting: *Deleted

## 2013-06-18 DIAGNOSIS — I6350 Cerebral infarction due to unspecified occlusion or stenosis of unspecified cerebral artery: Secondary | ICD-10-CM

## 2013-06-18 DIAGNOSIS — I639 Cerebral infarction, unspecified: Secondary | ICD-10-CM

## 2013-06-19 LAB — MDC_IDC_ENUM_SESS_TYPE_REMOTE
MDC IDC SESS DTM: 20150419003731
MDC IDC SET ZONE DETECTION INTERVAL: 3000 ms
Zone Setting Detection Interval: 2000 ms
Zone Setting Detection Interval: 360 ms

## 2013-06-28 MED ORDER — AMOXICILLIN 500 MG PO CAPS: 500.0000 mg | ORAL_CAPSULE | Freq: Three times a day (TID) | ORAL | Status: DC

## 2013-07-01 ENCOUNTER — Encounter (HOSPITAL_COMMUNITY): Payer: Self-pay | Admitting: Emergency Medicine

## 2013-07-01 ENCOUNTER — Emergency Department (HOSPITAL_COMMUNITY)
Admission: EM | Admit: 2013-07-01 | Discharge: 2013-07-01 | Disposition: A | Discharge status: Home / Self Care | Payer: 59 | Attending: Emergency Medicine | Admitting: Emergency Medicine

## 2013-07-01 DIAGNOSIS — Z87891 Personal history of nicotine dependence: Secondary | ICD-10-CM | POA: Insufficient documentation

## 2013-07-01 DIAGNOSIS — R110 Nausea: Secondary | ICD-10-CM | POA: Insufficient documentation

## 2013-07-01 DIAGNOSIS — Z8673 Personal history of transient ischemic attack (TIA), and cerebral infarction without residual deficits: Secondary | ICD-10-CM | POA: Insufficient documentation

## 2013-07-01 DIAGNOSIS — R1013 Epigastric pain: Secondary | ICD-10-CM | POA: Insufficient documentation

## 2013-07-01 DIAGNOSIS — Z792 Long term (current) use of antibiotics: Secondary | ICD-10-CM | POA: Insufficient documentation

## 2013-07-01 DIAGNOSIS — N183 Chronic kidney disease, stage 3 unspecified: Secondary | ICD-10-CM | POA: Insufficient documentation

## 2013-07-01 DIAGNOSIS — Z7902 Long term (current) use of antithrombotics/antiplatelets: Secondary | ICD-10-CM | POA: Insufficient documentation

## 2013-07-01 DIAGNOSIS — Z794 Long term (current) use of insulin: Secondary | ICD-10-CM | POA: Insufficient documentation

## 2013-07-01 DIAGNOSIS — Z791 Long term (current) use of non-steroidal anti-inflammatories (NSAID): Secondary | ICD-10-CM | POA: Insufficient documentation

## 2013-07-01 DIAGNOSIS — R197 Diarrhea, unspecified: Secondary | ICD-10-CM

## 2013-07-01 DIAGNOSIS — Z79899 Other long term (current) drug therapy: Secondary | ICD-10-CM | POA: Insufficient documentation

## 2013-07-01 DIAGNOSIS — I129 Hypertensive chronic kidney disease with stage 1 through stage 4 chronic kidney disease, or unspecified chronic kidney disease: Secondary | ICD-10-CM | POA: Insufficient documentation

## 2013-07-01 DIAGNOSIS — Z7982 Long term (current) use of aspirin: Secondary | ICD-10-CM | POA: Insufficient documentation

## 2013-07-01 DIAGNOSIS — E782 Mixed hyperlipidemia: Secondary | ICD-10-CM | POA: Insufficient documentation

## 2013-07-01 LAB — CBC WITH DIFFERENTIAL/PLATELET
BASOS PCT: 0 % (ref 0–1)
Basophils Absolute: 0 10*3/uL (ref 0.0–0.1)
Eosinophils Absolute: 0.1 10*3/uL (ref 0.0–0.7)
Eosinophils Relative: 1 % (ref 0–5)
HCT: 39.8 % (ref 36.0–46.0)
Hemoglobin: 13.4 g/dL (ref 12.0–15.0)
Lymphocytes Relative: 15 % (ref 12–46)
Lymphs Abs: 1.9 10*3/uL (ref 0.7–4.0)
MCH: 28.8 pg (ref 26.0–34.0)
MCHC: 33.7 g/dL (ref 30.0–36.0)
MCV: 85.6 fL (ref 78.0–100.0)
MONO ABS: 1 10*3/uL (ref 0.1–1.0)
MONOS PCT: 8 % (ref 3–12)
Neutro Abs: 9.5 10*3/uL — ABNORMAL HIGH (ref 1.7–7.7)
Neutrophils Relative %: 76 % (ref 43–77)
PLATELETS: 451 10*3/uL — AB (ref 150–400)
RBC: 4.65 MIL/uL (ref 3.87–5.11)
RDW: 13.7 % (ref 11.5–15.5)
WBC: 12.5 10*3/uL — ABNORMAL HIGH (ref 4.0–10.5)

## 2013-07-01 LAB — LIPASE, BLOOD: LIPASE: 42 U/L (ref 11–59)

## 2013-07-01 LAB — COMPREHENSIVE METABOLIC PANEL
ALBUMIN: 3.8 g/dL (ref 3.5–5.2)
ALK PHOS: 79 U/L (ref 39–117)
ALT: 16 U/L (ref 0–35)
AST: 13 U/L (ref 0–37)
BILIRUBIN TOTAL: 0.3 mg/dL (ref 0.3–1.2)
BUN: 14 mg/dL (ref 6–23)
CHLORIDE: 96 meq/L (ref 96–112)
CO2: 26 meq/L (ref 19–32)
Calcium: 9.7 mg/dL (ref 8.4–10.5)
Creatinine, Ser: 1.18 mg/dL — ABNORMAL HIGH (ref 0.50–1.10)
GFR calc Af Amer: 56 mL/min — ABNORMAL LOW (ref >90)
GFR, EST NON AFRICAN AMERICAN: 48 mL/min — AB (ref >90)
Glucose, Bld: 142 mg/dL — ABNORMAL HIGH (ref 70–99)
POTASSIUM: 3.7 meq/L (ref 3.7–5.3)
Sodium: 138 mEq/L (ref 137–147)
Total Protein: 7.6 g/dL (ref 6.0–8.3)

## 2013-07-01 MED ORDER — ONDANSETRON HCL 4 MG/2ML IJ SOLN
4.0000 mg | Freq: Once | INTRAMUSCULAR | Status: AC
  Administered 2013-07-01: 4 mg via INTRAVENOUS
  Filled 2013-07-01: qty 2

## 2013-07-01 MED ORDER — SODIUM CHLORIDE 0.9 % IV BOLUS (SEPSIS)
1000.0000 mL | Freq: Once | INTRAVENOUS | Status: AC
  Administered 2013-07-01: 1000 mL via INTRAVENOUS

## 2013-07-01 MED ORDER — DIPHENOXYLATE-ATROPINE 2.5-0.025 MG PO TABS: 1.0000 | ORAL_TABLET | Freq: Four times a day (QID) | ORAL | Status: DC | PRN

## 2013-07-01 NOTE — Discharge Instructions (Signed)
Medication for diarrhea. Increase fluids. Followup your primary care Dr.

## 2013-07-01 NOTE — ED Notes (Signed)
Pt reports was put on zithromax and amoxicillin April 28th for congestion and was given a steroid shot.  Pt c/o diarrhea since then.

## 2013-07-01 NOTE — ED Provider Notes (Signed)
CSN: [id number]     Arrival date & time 07/01/13  1438 History  This chart was scribed for Kelli Christen, MD by Roxan Diesel, ED scribe.  This patient was seen in room APA02/APA02 and the patient's care was started at 3:27 PM.   Chief Complaint  Patient presents with  . Diarrhea    The history is provided by the patient and the spouse. No language interpreter was used.    HPI Comments: Kelli [name] is a 63 y.o. female who presents to the Emergency Department complaining of diarrhea that began 4 days ago.  Pt states her home health nurse was visiting her 5 days ago and sent her to her PCP for congestion.  Her PCP placed her on amoxicillin and zithromax on 5/28 and gave her a prednisone injection.  She states that her diarrhea began on the 29th and has persisted since then.  She feels it may be due to the antibiotics.  She has also become nauseated but denies vomiting.  She states she has eaten a small amount in the past 24 hours and has been drinking some fluids.  Pt has h/o strokes with residual left-sided deficits and is unable to walk.  Husband states "she never has any energy, she's always lethargic."  He is concerned she may be on too many medications.  PCP is Dr. Quintin Alto in [facility] Cardiologist is Dr. Domenic Polite   Past Medical History  Diagnosis Date  . Essential hypertension, benign   . Fibromyalgia   . Cerebral infarction Aug 2014    Bihemispheric watershed infarcts  . Mixed hyperlipidemia   . Borderline diabetes   . Urinary incontinence   . Carotid artery occlusion     Occluded RICA, status post left CEA  August 2014 - Dr. Donnetta Hutching  . CKD (chronic kidney disease) stage 3, GFR 30-59 ml/min   . 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 Stroke     Past Surgical History  Procedure Laterality Date  . Urethral dilation  1980's  . Combined hysterectomy vaginal w/ mmk / a&p repair  1981  . Vaginal hysterectomy  1981    "partial"  (10/04/2012)  . Endarterectomy Left 10/06/2012    Procedure: Carotid Endarterectomy with Finesse patch angioplasty;  Surgeon: [name], MD;  Location: [facility];  Service: Vascular;  Laterality: Left;  . Loop recorder implant  04/16/13    MDT LinQ implanted for cryptogenic stroke  . [name] without cardioversion N/A 04/16/2013    Procedure: TRANSESOPHAGEAL ECHOCARDIOGRAM ([name]);  Surgeon: Josue Hector, MD;  Location: [facility] ENDOSCOPY;  Service: Cardiovascular;  Laterality: N/A;    Family History  Problem Relation Age of Onset  . Diabetes Mother   . Diabetes Father   . Hypertension Sister   . Diabetes Brother   . Seizures Son   . Kidney disease Maternal Grandmother   . Hypertension Maternal Grandmother   . Heart disease Maternal Grandfather   . Diabetes Paternal Grandmother   . Heart disease Paternal Grandfather     History  Substance Use Topics  . Smoking status: Former Smoker -- 1.00 packs/day for 44 years    Types: Cigarettes    Quit date: 10/03/2012  . Smokeless tobacco: Never Used  . Alcohol Use: No    OB History   Grav Para Term Preterm Abortions TAB SAB Ect Mult Living  Review of Systems A complete 10 system review of systems was obtained and all systems are negative except as noted in the HPI and PMH.     Allergies  Hydrochlorothiazide  Home Medications   Prior to Admission medications   Medication Sig Start Date End Date Taking? Authorizing Provider  acetaminophen (TYLENOL) 325 MG tablet Take 1-2 tablets (325-650 mg total) by mouth every 4 (four) hours as needed for mild pain. 05/07/13   Bary Leriche, PA-C  alum & mag hydroxide-simeth (MAALOX/MYLANTA) 200-200-20 MG/5ML suspension Take 30 mLs by mouth every 4 (four) hours as needed for indigestion. 05/07/13   Ivan Anchors Love, PA-C  amLODipine (NORVASC) 10 MG tablet Take 10 mg by mouth daily.    Historical Provider, MD  amoxicillin (AMOXIL) 500 MG capsule Take 1 capsule (500 mg total) by mouth 3 (three)  times daily.    Wille Celeste, PA-C  aspirin EC 81 MG tablet Take 81 mg by mouth every morning.    Historical Provider, MD  atenolol (TENORMIN) 25 MG tablet Take 2 tablets (50 mg total) by mouth 2 (two) times daily. 05/07/13   Bary Leriche, PA-C  clonazePAM (KLONOPIN) 0.5 MG tablet Take 1/2 tablet to (2) half tablets by mouth twice daily as needed for anxiety. Note half tablets 05/09/13   Pricilla Larsson, NP  cloNIDine (CATAPRES - DOSED IN MG/24 HR) 0.3 mg/24hr patch Place 0.3 mg onto the skin once a week. On Tuesday    Historical Provider, MD  clopidogrel (PLAVIX) 75 MG tablet Take 1 tablet (75 mg total) by mouth daily with breakfast. 05/07/13   Ivan Anchors Love, PA-C  diclofenac sodium (VOLTAREN) 1 % GEL Apply 2 g topically 4 (four) times daily. To left shoulder 05/07/13   Ivan Anchors Love, PA-C  DULoxetine (CYMBALTA) 20 MG capsule Take 40 mg by mouth daily.    Historical Provider, MD  gabapentin (NEURONTIN) 300 MG capsule Take 1 capsule (300 mg total) by mouth at bedtime. 05/07/13   Bary Leriche, PA-C  hydrALAZINE (APRESOLINE) 50 MG tablet Take 1 tablet (50 mg total) by mouth 4 (four) times daily. 05/07/13   Bary Leriche, PA-C  HYDROcodone-acetaminophen (NORCO/VICODIN) 5-325 MG per tablet Take one to two tablets by mouth every 4 hours as needed for moderate pain 05/14/13   Tiffany L Reed, DO  insulin aspart (NOVOLOG) 100 UNIT/ML injection Inject 0-5 Units into the skin at bedtime. 05/07/13   Ivan Anchors Love, PA-C  insulin aspart (NOVOLOG) 100 UNIT/ML injection Inject 0-9 Units into the skin 3 (three) times daily with meals. 05/07/13   Ivan Anchors Love, PA-C  polyethylene glycol (MIRALAX / GLYCOLAX) packet Take 17 g by mouth 2 (two) times daily. 05/07/13   Ivan Anchors Love, PA-C  senna-docusate (SENOKOT-S) 8.6-50 MG per tablet Take 2 tablets by mouth 2 (two) times daily. 05/07/13   Bary Leriche, PA-C  simvastatin (ZOCOR) 10 MG tablet Take 1 tablet (10 mg total) by mouth daily at 6 PM. 10/19/12   Lavon Paganini Angiulli, PA-C   BP  181/77  Pulse 106  Temp(Src) 98 F (36.7 C) (Oral)  Resp 20  Ht 5\' 5"  (1.651 m)  Wt 190 lb (86.183 kg)  BMI 31.62 kg/m2  SpO2 98%  Physical Exam  Nursing note and vitals reviewed. Constitutional: She is oriented to person, place, and time. She appears well-developed and well-nourished.  HENT:  Head: Normocephalic and atraumatic.  Mouth/Throat: Mucous membranes are dry (minimally).  Eyes: Conjunctivae and EOM  are normal. Pupils are equal, round, and reactive to light.  Neck: Normal range of motion. Neck supple.  Cardiovascular: Normal rate, regular rhythm and normal heart sounds.   Pulmonary/Chest: Effort normal and breath sounds normal.  Abdominal: Soft. Bowel sounds are normal. There is tenderness (minimal epigastric tenderness).  Musculoskeletal: Normal range of motion.  Neurological: She is alert and oriented to person, place, and time.  Left side immobilized due to stroke  Skin: Skin is warm and dry.  Psychiatric: She has a normal mood and affect. Her behavior is normal.    ED Course  Procedures (including critical care time)  DIAGNOSTIC STUDIES: Oxygen Saturation is 98% on room air, normal by my interpretation.    COORDINATION OF CARE: 3:33 PM-Discussed treatment plan which includes IV fluids and labs with pt at bedside and pt agreed to plan.     Labs Review Labs Reviewed  CBC WITH DIFFERENTIAL - Abnormal; Notable for the following:    WBC 12.5 (*)    Platelets 451 (*)    Neutro Abs 9.5 (*)    All other components within normal limits  COMPREHENSIVE METABOLIC PANEL - Abnormal; Notable for the following:    Glucose, Bld 142 (*)    Creatinine, Ser 1.18 (*)    GFR calc non Af Amer 48 (*)    GFR calc Af Amer 56 (*)    All other components within normal limits  LIPASE, BLOOD    Imaging Review No results found.   EKG Interpretation None      MDM   Final diagnoses:  Diarrhea    Patient feels much better after IV fluids. Labs stable. Discussed  findings with patient and her husband. Discharge medications Lomotil.  Patient has primary care followup    I personally performed the services described in this documentation, which was scribed in my presence. The recorded information has been reviewed and is accurate.    Kelli Christen, MD 07/01/13 [id number]

## 2013-07-01 NOTE — ED Notes (Signed)
Pt alert & oriented x4. Patient given discharge instructions, paperwork & prescription(s). Patient verbalized understanding. Pt left department w/ no further questions.  

## 2013-07-05 ENCOUNTER — Encounter: Payer: Self-pay | Admitting: Internal Medicine

## 2013-07-06 ENCOUNTER — Encounter: Payer: 59 | Attending: Physical Medicine & Rehabilitation

## 2013-07-06 ENCOUNTER — Encounter: Payer: Self-pay | Admitting: Physical Medicine & Rehabilitation

## 2013-07-06 ENCOUNTER — Ambulatory Visit (HOSPITAL_BASED_OUTPATIENT_CLINIC_OR_DEPARTMENT_OTHER): Payer: 59 | Admitting: Physical Medicine & Rehabilitation

## 2013-07-06 VITALS — BP 171/72 | HR 66 | Resp 16

## 2013-07-06 DIAGNOSIS — M25519 Pain in unspecified shoulder: Secondary | ICD-10-CM | POA: Insufficient documentation

## 2013-07-06 DIAGNOSIS — E782 Mixed hyperlipidemia: Secondary | ICD-10-CM | POA: Insufficient documentation

## 2013-07-06 DIAGNOSIS — M25512 Pain in left shoulder: Secondary | ICD-10-CM

## 2013-07-06 DIAGNOSIS — R209 Unspecified disturbances of skin sensation: Secondary | ICD-10-CM | POA: Insufficient documentation

## 2013-07-06 DIAGNOSIS — I69998 Other sequelae following unspecified cerebrovascular disease: Secondary | ICD-10-CM | POA: Insufficient documentation

## 2013-07-06 DIAGNOSIS — IMO0001 Reserved for inherently not codable concepts without codable children: Secondary | ICD-10-CM | POA: Insufficient documentation

## 2013-07-06 DIAGNOSIS — E119 Type 2 diabetes mellitus without complications: Secondary | ICD-10-CM | POA: Insufficient documentation

## 2013-07-06 DIAGNOSIS — G8110 Spastic hemiplegia affecting unspecified side: Secondary | ICD-10-CM | POA: Insufficient documentation

## 2013-07-06 DIAGNOSIS — N183 Chronic kidney disease, stage 3 unspecified: Secondary | ICD-10-CM | POA: Insufficient documentation

## 2013-07-06 DIAGNOSIS — I129 Hypertensive chronic kidney disease with stage 1 through stage 4 chronic kidney disease, or unspecified chronic kidney disease: Secondary | ICD-10-CM | POA: Insufficient documentation

## 2013-07-06 NOTE — Progress Notes (Signed)
Subjective:    Patient ID: [name], female    DOB: [birth year], 63 y.o.   MRN: [id number] This is a 63 year-old woman who has a past history of a left cerebellar stroke who made a good recovery, living with her husband and son in her home near [place], [place].   She was admitted to hospital from 04/08/2013 through 04/17/2013 with acute onset of a right posterior cerebral artery infarct. This left extensive damage in the right occipital lobe, the mid and posterior right temporal lobe, mid brain and posterior right thalamus. There was no hemorrhage. She was discharged to [facility] inpatient rehab where she was from 04/17/2013 through 05/07/2013 and was sent here for ongoing care. Patient was at [facility] from 05/07/2013 through 06/15/2013 HPI Patient is currently at home with her husband. She is getting home health therapy PT and OT twice a week Patient requires 24-hour care and has a 8hr/d  Caregiver  Requires assistance for dressing and bathing.  Uses bedside commode for toileting.  Primary care physician Consuello Masse MD   Pain Inventory Average Pain 3 Pain Right Now na My pain is dull  In the last 24 hours, has pain interfered with the following? General activity 3 Relation with others 2 Enjoyment of life 3 What TIME of day is your pain at its worst? morning Sleep (in general) Fair  Pain is worse with: other Pain improves with: medication Relief from Meds: na  Mobility how many minutes can you walk? none ability to climb steps?  no do you drive?  no use a wheelchair Do you have any goals in this area?  yes  Function retired I need assistance with the following:  dressing, bathing, toileting, meal prep, household duties and shopping Do you have any goals in this area?  yes  Neuro/Psych weakness numbness tingling trouble walking  Prior Studies Any changes since last visit?  no  Physicians involved in your care Any changes since last  visit?  no   Family History  Problem Relation Age of Onset  . Diabetes Mother   . Diabetes Father   . Hypertension Sister   . Diabetes Brother   . Seizures Son   . Kidney disease Maternal Grandmother   . Hypertension Maternal Grandmother   . Heart disease Maternal Grandfather   . Diabetes Paternal Grandmother   . Heart disease Paternal Grandfather    History   Social History  . Marital Status: Married    Spouse Name: N/A    Number of Children: N/A  . Years of Education: N/A   Social History Main Topics  . Smoking status: Former Smoker -- 1.00 packs/day for 44 years    Types: Cigarettes    Quit date: 10/03/2012  . Smokeless tobacco: Never Used  . Alcohol Use: No  . Drug Use: No  . Sexual Activity: Yes    Birth Control/ Protection: Surgical   Other Topics Concern  . None   Social History Narrative  . None   Past Surgical History  Procedure Laterality Date  . Urethral dilation  1980's  . Combined hysterectomy vaginal w/ mmk / a&p repair  1981  . Vaginal hysterectomy  1981    "partial" (10/04/2012)  . Endarterectomy Left 10/06/2012    Procedure: Carotid Endarterectomy with Finesse patch angioplasty;  Surgeon: [name], MD;  Location: [facility];  Service: Vascular;  Laterality: Left;  . Loop recorder implant  04/16/13    MDT LinQ implanted  for cryptogenic stroke  . [name] without cardioversion N/A 04/16/2013    Procedure: TRANSESOPHAGEAL ECHOCARDIOGRAM ([name]);  Surgeon: Josue Hector, MD;  Location: [facility] ENDOSCOPY;  Service: Cardiovascular;  Laterality: N/A;   Past Medical History  Diagnosis Date  . Essential hypertension, benign   . Fibromyalgia   . Cerebral infarction Aug 2014    Bihemispheric watershed infarcts  . Mixed hyperlipidemia   . Borderline diabetes   . Urinary incontinence   . Carotid artery occlusion     Occluded RICA, status post left CEA  August 2014 - Dr. Donnetta Hutching  . CKD (chronic kidney disease) stage 3, GFR 30-59 ml/min   . Cerebral infarction involving  left cerebellar artery Feb 2015    due to small vessel disease  . Stroke    BP 171/72  Pulse 66  Resp 16  SpO2 93%  Opioid Risk Score:   Fall Risk Score:     Review of Systems  Gastrointestinal: Positive for nausea and diarrhea.  Musculoskeletal: Positive for gait problem.  Neurological: Positive for weakness and numbness.       Tingling  All other systems reviewed and are negative.      Objective:   Physical Exam Oriented person, [place], not oriented to time Right side is 5/5 in the deltoid, bicep, tricep, grip, hip flexor, knee extensors, ankle dorsiflexor Left hip and knee extensor synergy 2+/3 minus ankle dorsiflexor 0 ankle plantar flexor to  Sensory intact on the RIght side Mildly impaired Left upper ext and intact LLE Left shoulder without subluxation Reduce ROM all planes with end range pain     Assessment & Plan:  1.  R PCA infarct with L HP, discussed stroke location as well as expected deficits and prognosis Viewed MRI findings Discussed rehab plan, cont HH PT/OT then transition to OP PT/OT/SLP at [facility] RTC 4-6 wks  Over half of the 25 min visit was spent counseling and coordinating care.

## 2013-07-06 NOTE — Patient Instructions (Signed)
Isotoner glove for Left hand  Please ask home health therapist to call my office once they feel they have reached maximum improvement at home so I can set up outpatient therapy

## 2013-07-09 ENCOUNTER — Telehealth: Payer: Self-pay

## 2013-07-09 NOTE — Telephone Encounter (Signed)
Patient called with questions about additional therapies/treatment for her left arm.  She would like to know what other options she has?

## 2013-07-18 ENCOUNTER — Encounter (INDEPENDENT_AMBULATORY_CARE_PROVIDER_SITE_OTHER): Payer: Self-pay

## 2013-07-18 DIAGNOSIS — Z0289 Encounter for other administrative examinations: Secondary | ICD-10-CM

## 2013-07-19 ENCOUNTER — Ambulatory Visit (INDEPENDENT_AMBULATORY_CARE_PROVIDER_SITE_OTHER): Payer: 59 | Admitting: *Deleted

## 2013-07-19 DIAGNOSIS — I639 Cerebral infarction, unspecified: Secondary | ICD-10-CM

## 2013-07-19 DIAGNOSIS — I6350 Cerebral infarction due to unspecified occlusion or stenosis of unspecified cerebral artery: Secondary | ICD-10-CM

## 2013-07-19 LAB — MDC_IDC_ENUM_SESS_TYPE_REMOTE

## 2013-07-24 ENCOUNTER — Ambulatory Visit: Payer: 59 | Admitting: Neurology

## 2013-07-24 ENCOUNTER — Telehealth: Payer: Self-pay | Admitting: Cardiology

## 2013-07-24 NOTE — Telephone Encounter (Signed)
Message copied by Tiajuana Amass on Tue Jul 24, 2013 10:50 AM ------      Message from: Lynnell Jude, [place] K      Created: Mon Jul 23, 2013  9:15 AM       Hi Pamala Hurry,            Her monitor is not set up at home.  Can you call her and ask her to send a manual transmission?            Thanks! ------

## 2013-07-24 NOTE — Telephone Encounter (Signed)
Pt sitter informed me to call back after 4PM to speak with her husband.

## 2013-07-25 ENCOUNTER — Emergency Department (HOSPITAL_COMMUNITY): Payer: 59

## 2013-07-25 ENCOUNTER — Inpatient Hospital Stay (HOSPITAL_COMMUNITY)
Admission: EM | Admit: 2013-07-25 | Discharge: 2013-07-31 | DRG: 690 | Disposition: A | Discharge status: Home Health | Payer: 59 | Attending: Internal Medicine | Admitting: Internal Medicine

## 2013-07-25 ENCOUNTER — Encounter (HOSPITAL_COMMUNITY): Payer: Self-pay | Admitting: Emergency Medicine

## 2013-07-25 DIAGNOSIS — Z8673 Personal history of transient ischemic attack (TIA), and cerebral infarction without residual deficits: Secondary | ICD-10-CM

## 2013-07-25 DIAGNOSIS — M545 Low back pain, unspecified: Secondary | ICD-10-CM

## 2013-07-25 DIAGNOSIS — N183 Chronic kidney disease, stage 3 unspecified: Secondary | ICD-10-CM

## 2013-07-25 DIAGNOSIS — I129 Hypertensive chronic kidney disease with stage 1 through stage 4 chronic kidney disease, or unspecified chronic kidney disease: Secondary | ICD-10-CM | POA: Diagnosis present

## 2013-07-25 DIAGNOSIS — N10 Acute tubulo-interstitial nephritis: Principal | ICD-10-CM | POA: Diagnosis present

## 2013-07-25 DIAGNOSIS — Z87891 Personal history of nicotine dependence: Secondary | ICD-10-CM

## 2013-07-25 DIAGNOSIS — I639 Cerebral infarction, unspecified: Secondary | ICD-10-CM

## 2013-07-25 DIAGNOSIS — S43085A Other dislocation of left shoulder joint, initial encounter: Secondary | ICD-10-CM

## 2013-07-25 DIAGNOSIS — I739 Peripheral vascular disease, unspecified: Secondary | ICD-10-CM

## 2013-07-25 DIAGNOSIS — R635 Abnormal weight gain: Secondary | ICD-10-CM

## 2013-07-25 DIAGNOSIS — A498 Other bacterial infections of unspecified site: Secondary | ICD-10-CM | POA: Diagnosis present

## 2013-07-25 DIAGNOSIS — S43006A Unspecified dislocation of unspecified shoulder joint, initial encounter: Secondary | ICD-10-CM | POA: Diagnosis present

## 2013-07-25 DIAGNOSIS — R7309 Other abnormal glucose: Secondary | ICD-10-CM | POA: Diagnosis present

## 2013-07-25 DIAGNOSIS — R0989 Other specified symptoms and signs involving the circulatory and respiratory systems: Secondary | ICD-10-CM

## 2013-07-25 DIAGNOSIS — R7303 Prediabetes: Secondary | ICD-10-CM

## 2013-07-25 DIAGNOSIS — Y92129 Unspecified place in nursing home as the place of occurrence of the external cause: Secondary | ICD-10-CM

## 2013-07-25 DIAGNOSIS — N12 Tubulo-interstitial nephritis, not specified as acute or chronic: Secondary | ICD-10-CM

## 2013-07-25 DIAGNOSIS — E876 Hypokalemia: Secondary | ICD-10-CM | POA: Diagnosis present

## 2013-07-25 DIAGNOSIS — S43005A Unspecified dislocation of left shoulder joint, initial encounter: Secondary | ICD-10-CM

## 2013-07-25 DIAGNOSIS — D75839 Thrombocytosis, unspecified: Secondary | ICD-10-CM | POA: Diagnosis not present

## 2013-07-25 DIAGNOSIS — X58XXXA Exposure to other specified factors, initial encounter: Secondary | ICD-10-CM | POA: Diagnosis present

## 2013-07-25 DIAGNOSIS — I63239 Cerebral infarction due to unspecified occlusion or stenosis of unspecified carotid arteries: Secondary | ICD-10-CM

## 2013-07-25 DIAGNOSIS — R350 Frequency of micturition: Secondary | ICD-10-CM

## 2013-07-25 DIAGNOSIS — D473 Essential (hemorrhagic) thrombocythemia: Secondary | ICD-10-CM | POA: Diagnosis present

## 2013-07-25 DIAGNOSIS — I6523 Occlusion and stenosis of bilateral carotid arteries: Secondary | ICD-10-CM

## 2013-07-25 DIAGNOSIS — N390 Urinary tract infection, site not specified: Secondary | ICD-10-CM

## 2013-07-25 DIAGNOSIS — E669 Obesity, unspecified: Secondary | ICD-10-CM

## 2013-07-25 DIAGNOSIS — R609 Edema, unspecified: Secondary | ICD-10-CM

## 2013-07-25 DIAGNOSIS — N179 Acute kidney failure, unspecified: Secondary | ICD-10-CM | POA: Diagnosis present

## 2013-07-25 DIAGNOSIS — F411 Generalized anxiety disorder: Secondary | ICD-10-CM

## 2013-07-25 DIAGNOSIS — E782 Mixed hyperlipidemia: Secondary | ICD-10-CM

## 2013-07-25 DIAGNOSIS — R209 Unspecified disturbances of skin sensation: Secondary | ICD-10-CM

## 2013-07-25 DIAGNOSIS — H532 Diplopia: Secondary | ICD-10-CM

## 2013-07-25 DIAGNOSIS — I69998 Other sequelae following unspecified cerebrovascular disease: Secondary | ICD-10-CM

## 2013-07-25 DIAGNOSIS — B9620 Unspecified Escherichia coli [E. coli] as the cause of diseases classified elsewhere: Secondary | ICD-10-CM

## 2013-07-25 DIAGNOSIS — N182 Chronic kidney disease, stage 2 (mild): Secondary | ICD-10-CM

## 2013-07-25 DIAGNOSIS — Z720 Tobacco use: Secondary | ICD-10-CM

## 2013-07-25 DIAGNOSIS — R202 Paresthesia of skin: Secondary | ICD-10-CM

## 2013-07-25 DIAGNOSIS — F32A Depression, unspecified: Secondary | ICD-10-CM

## 2013-07-25 DIAGNOSIS — S43002A Unspecified subluxation of left shoulder joint, initial encounter: Secondary | ICD-10-CM

## 2013-07-25 DIAGNOSIS — D649 Anemia, unspecified: Secondary | ICD-10-CM | POA: Diagnosis present

## 2013-07-25 DIAGNOSIS — F329 Major depressive disorder, single episode, unspecified: Secondary | ICD-10-CM

## 2013-07-25 DIAGNOSIS — G8110 Spastic hemiplegia affecting unspecified side: Secondary | ICD-10-CM

## 2013-07-25 DIAGNOSIS — D72829 Elevated white blood cell count, unspecified: Secondary | ICD-10-CM

## 2013-07-25 DIAGNOSIS — R072 Precordial pain: Secondary | ICD-10-CM

## 2013-07-25 DIAGNOSIS — N189 Chronic kidney disease, unspecified: Secondary | ICD-10-CM

## 2013-07-25 DIAGNOSIS — IMO0001 Reserved for inherently not codable concepts without codable children: Secondary | ICD-10-CM | POA: Diagnosis present

## 2013-07-25 DIAGNOSIS — M25559 Pain in unspecified hip: Secondary | ICD-10-CM

## 2013-07-25 DIAGNOSIS — E1165 Type 2 diabetes mellitus with hyperglycemia: Secondary | ICD-10-CM | POA: Diagnosis present

## 2013-07-25 DIAGNOSIS — R531 Weakness: Secondary | ICD-10-CM

## 2013-07-25 DIAGNOSIS — R42 Dizziness and giddiness: Secondary | ICD-10-CM

## 2013-07-25 DIAGNOSIS — M25512 Pain in left shoulder: Secondary | ICD-10-CM

## 2013-07-25 DIAGNOSIS — E861 Hypovolemia: Secondary | ICD-10-CM | POA: Diagnosis present

## 2013-07-25 DIAGNOSIS — I69959 Hemiplegia and hemiparesis following unspecified cerebrovascular disease affecting unspecified side: Secondary | ICD-10-CM

## 2013-07-25 DIAGNOSIS — E871 Hypo-osmolality and hyponatremia: Secondary | ICD-10-CM

## 2013-07-25 DIAGNOSIS — Z833 Family history of diabetes mellitus: Secondary | ICD-10-CM

## 2013-07-25 DIAGNOSIS — M25569 Pain in unspecified knee: Secondary | ICD-10-CM

## 2013-07-25 DIAGNOSIS — R739 Hyperglycemia, unspecified: Secondary | ICD-10-CM

## 2013-07-25 DIAGNOSIS — W19XXXA Unspecified fall, initial encounter: Secondary | ICD-10-CM

## 2013-07-25 DIAGNOSIS — Z8249 Family history of ischemic heart disease and other diseases of the circulatory system: Secondary | ICD-10-CM

## 2013-07-25 DIAGNOSIS — I10 Essential (primary) hypertension: Secondary | ICD-10-CM

## 2013-07-25 DIAGNOSIS — N289 Disorder of kidney and ureter, unspecified: Secondary | ICD-10-CM

## 2013-07-25 HISTORY — DX: Other dislocation of left shoulder joint, initial encounter: S43.085A

## 2013-07-25 LAB — BASIC METABOLIC PANEL
BUN: 36 mg/dL — ABNORMAL HIGH (ref 6–23)
CALCIUM: 9.2 mg/dL (ref 8.4–10.5)
CO2: 23 mEq/L (ref 19–32)
CREATININE: 1.93 mg/dL — AB (ref 0.50–1.10)
Chloride: 95 mEq/L — ABNORMAL LOW (ref 96–112)
GFR, EST AFRICAN AMERICAN: 31 mL/min — AB (ref >90)
GFR, EST NON AFRICAN AMERICAN: 27 mL/min — AB (ref >90)
Glucose, Bld: 183 mg/dL — ABNORMAL HIGH (ref 70–99)
Potassium: 4.6 mEq/L (ref 3.7–5.3)
Sodium: 134 mEq/L — ABNORMAL LOW (ref 137–147)

## 2013-07-25 LAB — CBC WITH DIFFERENTIAL/PLATELET
BASOS ABS: 0 10*3/uL (ref 0.0–0.1)
BASOS PCT: 0 % (ref 0–1)
EOS PCT: 0 % (ref 0–5)
Eosinophils Absolute: 0 10*3/uL (ref 0.0–0.7)
HCT: 33.8 % — ABNORMAL LOW (ref 36.0–46.0)
Hemoglobin: 11.1 g/dL — ABNORMAL LOW (ref 12.0–15.0)
Lymphocytes Relative: 5 % — ABNORMAL LOW (ref 12–46)
Lymphs Abs: 0.5 10*3/uL — ABNORMAL LOW (ref 0.7–4.0)
MCH: 28.4 pg (ref 26.0–34.0)
MCHC: 32.8 g/dL (ref 30.0–36.0)
MCV: 86.4 fL (ref 78.0–100.0)
Monocytes Absolute: 0.5 10*3/uL (ref 0.1–1.0)
Monocytes Relative: 5 % (ref 3–12)
Neutro Abs: 8.1 10*3/uL — ABNORMAL HIGH (ref 1.7–7.7)
Neutrophils Relative %: 90 % — ABNORMAL HIGH (ref 43–77)
PLATELETS: 201 10*3/uL (ref 150–400)
RBC: 3.91 MIL/uL (ref 3.87–5.11)
RDW: 14.5 % (ref 11.5–15.5)
WBC: 9 10*3/uL (ref 4.0–10.5)

## 2013-07-25 LAB — LIPID PANEL
CHOL/HDL RATIO: 29.8 ratio
CHOLESTEROL: 149 mg/dL (ref 0–200)
HDL: 5 mg/dL — AB (ref >39)
LDL CALC: 72 mg/dL (ref 0–99)
TRIGLYCERIDES: 358 mg/dL — AB (ref <150)
VLDL: 72 mg/dL — AB (ref 0–40)

## 2013-07-25 LAB — HEPATIC FUNCTION PANEL
ALK PHOS: 100 U/L (ref 39–117)
ALT: 16 U/L (ref 0–35)
AST: 15 U/L (ref 0–37)
Albumin: 2.8 g/dL — ABNORMAL LOW (ref 3.5–5.2)
Bilirubin, Direct: <0.2 mg/dL (ref 0.0–0.3)
TOTAL PROTEIN: 7.3 g/dL (ref 6.0–8.3)
Total Bilirubin: 0.5 mg/dL (ref 0.3–1.2)

## 2013-07-25 LAB — GLUCOSE, CAPILLARY
Glucose-Capillary: 147 mg/dL — ABNORMAL HIGH (ref 70–99)
Glucose-Capillary: 158 mg/dL — ABNORMAL HIGH (ref 70–99)

## 2013-07-25 LAB — URINE MICROSCOPIC-ADD ON

## 2013-07-25 LAB — URINALYSIS, ROUTINE W REFLEX MICROSCOPIC
BILIRUBIN URINE: NEGATIVE
Glucose, UA: NEGATIVE mg/dL
KETONES UR: NEGATIVE mg/dL
NITRITE: POSITIVE — AB
Protein, ur: 100 mg/dL — AB
Specific Gravity, Urine: >1.03 — ABNORMAL HIGH (ref 1.005–1.030)
UROBILINOGEN UA: 0.2 mg/dL (ref 0.0–1.0)
pH: 5.5 (ref 5.0–8.0)

## 2013-07-25 LAB — LACTIC ACID, PLASMA: Lactic Acid, Venous: 1.3 mmol/L (ref 0.5–2.2)

## 2013-07-25 LAB — LIPASE, BLOOD: Lipase: 24 U/L (ref 11–59)

## 2013-07-25 MED ORDER — DEXTROSE 5 % IV SOLN
1.0000 g | Freq: Once | INTRAVENOUS | Status: AC
  Administered 2013-07-25: 1 g via INTRAVENOUS
  Filled 2013-07-25: qty 10

## 2013-07-25 MED ORDER — CLOPIDOGREL BISULFATE 75 MG PO TABS
75.0000 mg | ORAL_TABLET | Freq: Every day | ORAL | Status: DC
  Administered 2013-07-26 – 2013-07-31 (×6): 75 mg via ORAL
  Filled 2013-07-25 (×6): qty 1

## 2013-07-25 MED ORDER — INSULIN ASPART 100 UNIT/ML ~~LOC~~ SOLN
0.0000 [IU] | Freq: Three times a day (TID) | SUBCUTANEOUS | Status: DC
  Administered 2013-07-25 – 2013-07-26 (×3): 2 [IU] via SUBCUTANEOUS
  Administered 2013-07-26: 3 [IU] via SUBCUTANEOUS
  Administered 2013-07-27: 2 [IU] via SUBCUTANEOUS
  Administered 2013-07-27: 3 [IU] via SUBCUTANEOUS
  Administered 2013-07-27: 2 [IU] via SUBCUTANEOUS
  Administered 2013-07-28 (×2): 3 [IU] via SUBCUTANEOUS
  Administered 2013-07-28: 2 [IU] via SUBCUTANEOUS
  Administered 2013-07-29 (×2): 3 [IU] via SUBCUTANEOUS
  Administered 2013-07-29: 1 [IU] via SUBCUTANEOUS
  Administered 2013-07-30 – 2013-07-31 (×5): 2 [IU] via SUBCUTANEOUS

## 2013-07-25 MED ORDER — ACETAMINOPHEN 325 MG PO TABS
650.0000 mg | ORAL_TABLET | Freq: Four times a day (QID) | ORAL | Status: DC | PRN
  Administered 2013-07-25 – 2013-07-29 (×3): 650 mg via ORAL
  Filled 2013-07-25 (×4): qty 2

## 2013-07-25 MED ORDER — SIMVASTATIN 10 MG PO TABS
10.0000 mg | ORAL_TABLET | Freq: Every day | ORAL | Status: DC
  Administered 2013-07-25 – 2013-07-30 (×6): 10 mg via ORAL
  Filled 2013-07-25 (×6): qty 1

## 2013-07-25 MED ORDER — CLONIDINE HCL 0.3 MG/24HR TD PTWK
0.3000 mg | MEDICATED_PATCH | TRANSDERMAL | Status: DC
  Filled 2013-07-25: qty 1

## 2013-07-25 MED ORDER — POLYETHYLENE GLYCOL 3350 17 G PO PACK: 17.0000 g | PACK | Freq: Every day | ORAL | Status: DC | PRN

## 2013-07-25 MED ORDER — ASPIRIN EC 81 MG PO TBEC
81.0000 mg | DELAYED_RELEASE_TABLET | Freq: Every day | ORAL | Status: DC
  Administered 2013-07-25 – 2013-07-31 (×7): 81 mg via ORAL
  Filled 2013-07-25 (×7): qty 1

## 2013-07-25 MED ORDER — ACETAMINOPHEN 650 MG RE SUPP: 650.0000 mg | Freq: Four times a day (QID) | RECTAL | Status: DC | PRN

## 2013-07-25 MED ORDER — HYDRALAZINE HCL 25 MG PO TABS
50.0000 mg | ORAL_TABLET | Freq: Four times a day (QID) | ORAL | Status: DC
  Administered 2013-07-25 – 2013-07-31 (×25): 50 mg via ORAL
  Filled 2013-07-25 (×26): qty 2

## 2013-07-25 MED ORDER — ALUM & MAG HYDROXIDE-SIMETH 200-200-20 MG/5ML PO SUSP: 30.0000 mL | Freq: Four times a day (QID) | ORAL | Status: DC | PRN

## 2013-07-25 MED ORDER — DEXTROSE 5 % IV SOLN
1.0000 g | INTRAVENOUS | Status: DC
  Administered 2013-07-26 – 2013-07-30 (×4): 1 g via INTRAVENOUS
  Filled 2013-07-25 (×7): qty 10

## 2013-07-25 MED ORDER — ONDANSETRON HCL 4 MG/2ML IJ SOLN
4.0000 mg | Freq: Four times a day (QID) | INTRAMUSCULAR | Status: DC | PRN
  Administered 2013-07-25 – 2013-07-27 (×2): 4 mg via INTRAVENOUS
  Filled 2013-07-25 (×3): qty 2

## 2013-07-25 MED ORDER — ATENOLOL 25 MG PO TABS
25.0000 mg | ORAL_TABLET | Freq: Every day | ORAL | Status: DC
  Administered 2013-07-25 – 2013-07-31 (×7): 25 mg via ORAL
  Filled 2013-07-25 (×7): qty 1

## 2013-07-25 MED ORDER — DULOXETINE HCL 20 MG PO CPEP
40.0000 mg | ORAL_CAPSULE | Freq: Every day | ORAL | Status: DC
  Administered 2013-07-26 – 2013-07-31 (×6): 40 mg via ORAL
  Filled 2013-07-25 (×8): qty 2

## 2013-07-25 MED ORDER — CLONAZEPAM 0.5 MG PO TABS
0.2500 mg | ORAL_TABLET | Freq: Two times a day (BID) | ORAL | Status: DC | PRN
  Administered 2013-07-29: 0.25 mg via ORAL
  Filled 2013-07-25: qty 1

## 2013-07-25 MED ORDER — DILTIAZEM HCL ER COATED BEADS 180 MG PO CP24
360.0000 mg | ORAL_CAPSULE | Freq: Every day | ORAL | Status: DC
  Administered 2013-07-25 – 2013-07-31 (×7): 360 mg via ORAL
  Filled 2013-07-25 (×8): qty 2

## 2013-07-25 MED ORDER — ONDANSETRON HCL 4 MG PO TABS
4.0000 mg | ORAL_TABLET | Freq: Four times a day (QID) | ORAL | Status: DC | PRN
  Administered 2013-07-27: 4 mg via ORAL
  Filled 2013-07-25: qty 1

## 2013-07-25 MED ORDER — SODIUM CHLORIDE 0.9 % IV SOLN
INTRAVENOUS | Status: DC
  Administered 2013-07-26 – 2013-07-28 (×4): via INTRAVENOUS

## 2013-07-25 MED ORDER — SODIUM CHLORIDE 0.9 % IV BOLUS (SEPSIS)
500.0000 mL | Freq: Once | INTRAVENOUS | Status: AC
  Administered 2013-07-25: 500 mL via INTRAVENOUS

## 2013-07-25 MED ORDER — GABAPENTIN 300 MG PO CAPS
300.0000 mg | ORAL_CAPSULE | Freq: Every day | ORAL | Status: DC
  Administered 2013-07-25 – 2013-07-30 (×6): 300 mg via ORAL
  Filled 2013-07-25 (×6): qty 1

## 2013-07-25 MED ORDER — AMLODIPINE BESYLATE 5 MG PO TABS
10.0000 mg | ORAL_TABLET | Freq: Every day | ORAL | Status: DC
  Administered 2013-07-25 – 2013-07-31 (×7): 10 mg via ORAL
  Filled 2013-07-25 (×7): qty 2

## 2013-07-25 MED ORDER — ENOXAPARIN SODIUM 40 MG/0.4ML ~~LOC~~ SOLN
40.0000 mg | SUBCUTANEOUS | Status: DC
  Administered 2013-07-25 – 2013-07-31 (×7): 40 mg via SUBCUTANEOUS
  Filled 2013-07-25 (×7): qty 0.4

## 2013-07-25 MED ORDER — SODIUM CHLORIDE 0.9 % IV SOLN
INTRAVENOUS | Status: AC
Start: 2013-07-25 — End: 2013-07-25

## 2013-07-25 MED ORDER — INSULIN ASPART 100 UNIT/ML ~~LOC~~ SOLN: 0.0000 [IU] | Freq: Every day | SUBCUTANEOUS | Status: DC

## 2013-07-25 MED ORDER — ONDANSETRON HCL 4 MG/2ML IJ SOLN
4.0000 mg | Freq: Once | INTRAMUSCULAR | Status: AC
  Administered 2013-07-25: 4 mg via INTRAVENOUS
  Filled 2013-07-25: qty 2

## 2013-07-25 MED ORDER — HYDROCODONE-ACETAMINOPHEN 5-325 MG PO TABS
1.0000 | ORAL_TABLET | Freq: Four times a day (QID) | ORAL | Status: DC | PRN
  Administered 2013-07-26 – 2013-07-28 (×3): 1 via ORAL
  Filled 2013-07-25 (×3): qty 1

## 2013-07-25 MED ORDER — TRAZODONE HCL 50 MG PO TABS
50.0000 mg | ORAL_TABLET | Freq: Every evening | ORAL | Status: DC | PRN
  Filled 2013-07-25: qty 1

## 2013-07-25 NOTE — Progress Notes (Signed)
ANTIBIOTIC CONSULT NOTE - INITIAL  Pharmacy Consult for Rocephin Indication: UTI  Allergies  Allergen Reactions  . Hydrochlorothiazide Nausea And Vomiting   Patient Measurements: Height: 5\' 5"  (165.1 cm) Weight: 202 lb 13.2 oz (92 kg) IBW/kg (Calculated) : 57  Vital Signs: Temp: 99.1 F (37.3 C) (05/27 1318) Temp src: Oral (05/27 1318) BP: 154/70 mmHg (05/27 1318) Pulse Rate: 82 (05/27 1318) Intake/Output from previous day:   Intake/Output from this shift: Total I/O In: -  Out: 13 [Urine:13]  Labs:  Recent Labs  07/25/13 1012  WBC 9.0  HGB 11.1*  PLT 201  CREATININE 1.93*   Estimated Creatinine Clearance: 33.9 ml/min (by C-G formula based on Cr of 1.93). No results found for this basename: VANCOTROUGH, VANCOPEAK, VANCORANDOM, GENTTROUGH, GENTPEAK, GENTRANDOM, TOBRATROUGH, TOBRAPEAK, TOBRARND, AMIKACINPEAK, AMIKACINTROU, AMIKACIN,  in the last 72 hours   Microbiology: No results found for this or any previous visit (from the past 720 hour(s)).  Medical History: Past Medical History  Diagnosis Date  . Essential hypertension, benign   . Fibromyalgia   . Cerebral infarction Aug 2014    Bihemispheric watershed infarcts  . Mixed hyperlipidemia   . Borderline diabetes   . Urinary incontinence   . Carotid artery occlusion     Occluded RICA, status post left CEA  August 2014 - Dr. Donnetta Hutching  . CKD (chronic kidney disease) stage 3, GFR 30-59 ml/min   . 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 Stroke    Assessment: 63yo female present with c/o abdominal pain with n/v.  Initial w/u consistent with pyelonephritis.  Asked to initiate Rocephin IV (does not need renal adjustment).  Estimated Creatinine Clearance: 33.9 ml/min (by C-G formula based on Cr of 1.93).  Goal of Therapy:  Eradicate infection.  Plan:  Rocephin 1gm IV q24hrs Monitor labs, cultures and progress  Verginia Toohey A Precilla Purnell 07/25/2013,1:31 PM

## 2013-07-25 NOTE — ED Provider Notes (Signed)
CSN: [id number]     Arrival date & time 07/25/13  0910 History  This chart was scribed for Maudry Diego, MD by Elby Beck, ED Scribe. This patient was seen in room APA05/APA05 and the patient's care was started at 9:50 AM.   Chief Complaint  Patient presents with  . Emesis    Patient is a 63 y.o. female presenting with vomiting. The history is provided by the patient. No language interpreter was used.  Emesis Severity:  Moderate Duration:  2 days Timing:  Intermittent Number of daily episodes:  "multiple" Quality:  Unable to specify Progression:  Unchanged Chronicity:  New Context: not post-tussive and not self-induced   Relieved by:  Nothing Worsened by:  Nothing tried Ineffective treatments:  None tried Associated symptoms: abdominal pain and diarrhea   Associated symptoms: no cough and no headaches     HPI Comments: Kelli [name] is a 63 y.o. female with a history of HTN and CVA who presents to the Emergency Department complaining of multiple episodes of emesis over the past 2 days. She also reports associated episodes of diarrhea today as well as LUQ abdominal pain and generalized weakness. She denies cough, fever or any other associated symptoms. 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  PCP- Dr. Consuello Masse   Past Medical History  Diagnosis Date  . Essential hypertension, benign   . Fibromyalgia   . Cerebral infarction Aug 2014    Bihemispheric watershed infarcts  . Mixed hyperlipidemia   . Borderline diabetes   . Urinary incontinence   . Carotid artery occlusion     Occluded RICA, status post left CEA  August 2014 - Dr. Donnetta Hutching  . CKD (chronic kidney disease) stage 3, GFR 30-59 ml/min   . 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 Stroke    Past Surgical History  Procedure Laterality Date  . Urethral dilation  1980's  . Combined hysterectomy vaginal w/ mmk / a&p repair  1981  .  Vaginal hysterectomy  1981    "partial" (10/04/2012)  . Endarterectomy Left 10/06/2012    Procedure: Carotid Endarterectomy with Finesse patch angioplasty;  Surgeon: [name], MD;  Location: [facility];  Service: Vascular;  Laterality: Left;  . Loop recorder implant  04/16/13    MDT LinQ implanted for cryptogenic stroke  . [name] without cardioversion N/A 04/16/2013    Procedure: TRANSESOPHAGEAL ECHOCARDIOGRAM ([name]);  Surgeon: Josue Hector, MD;  Location: [facility] ENDOSCOPY;  Service: Cardiovascular;  Laterality: N/A;   Family History  Problem Relation Age of Onset  . Diabetes Mother   . Diabetes Father   . Hypertension Sister   . Diabetes Brother   . Seizures Son   . Kidney disease Maternal Grandmother   . Hypertension Maternal Grandmother   . Heart disease Maternal Grandfather   . Diabetes Paternal Grandmother   . Heart disease Paternal Grandfather    History  Substance Use Topics  . Smoking status: Former Smoker -- 1.00 packs/day for 44 years    Types: Cigarettes    Quit date: 10/03/2012  . Smokeless tobacco: Never Used  . Alcohol Use: No   OB History   Grav Para Term Preterm Abortions TAB SAB Ect Mult Living                 Review of Systems  Constitutional: Negative for fever, appetite change and fatigue.  HENT: Negative for congestion,  ear discharge and sinus pressure.   Eyes: Negative for discharge.  Respiratory: Negative for cough.   Cardiovascular: Negative for chest pain.  Gastrointestinal: Positive for vomiting, abdominal pain and diarrhea.  Genitourinary: Negative for frequency and hematuria.  Musculoskeletal: Negative for back pain.  Skin: Negative for rash.  Neurological: Positive for weakness (generalized). Negative for seizures and headaches.       Left-sided hemiplegia at baseline  Psychiatric/Behavioral: Negative for hallucinations.    Allergies  Hydrochlorothiazide  Home Medications   Prior to Admission medications   Medication Sig Start Date End Date  Taking? Authorizing Provider  acetaminophen (TYLENOL) 325 MG tablet Take 1-2 tablets (325-650 mg total) by mouth every 4 (four) hours as needed for mild pain. 05/07/13   Ivan Anchors Love, PA-C  amLODipine (NORVASC) 10 MG tablet Take 10 mg by mouth daily.    Historical Provider, MD  amoxicillin (AMOXIL) 500 MG capsule Take 1 capsule (500 mg total) by mouth 3 (three) times daily.    Wille Celeste, PA-C  aspirin EC 325 MG tablet Take 325 mg by mouth daily.    Historical Provider, MD  atenolol (TENORMIN) 25 MG tablet  06/16/13   Historical Provider, MD  azithromycin (ZITHROMAX) 500 MG tablet Take 500 mg by mouth daily. 3 day course starting on 4/30//2015 06/28/13   Historical Provider, MD  bismuth subsalicylate (PEPTO BISMOL) 262 MG/15ML suspension Take 30 mLs by mouth every 6 (six) hours as needed for indigestion or diarrhea or loose stools.    Historical Provider, MD  clonazePAM (KLONOPIN) 0.5 MG tablet Take 1/2 tablet to (2) half tablets by mouth twice daily as needed for anxiety. Note half tablets 05/09/13   Pricilla Larsson, NP  cloNIDine (CATAPRES - DOSED IN MG/24 HR) 0.3 mg/24hr patch Place 0.3 mg onto the skin once a week. On Tuesday    Historical Provider, MD  clopidogrel (PLAVIX) 75 MG tablet Take 1 tablet (75 mg total) by mouth daily with breakfast. 05/07/13   Ivan Anchors Love, PA-C  diltiazem (CARDIZEM CD) 360 MG 24 hr capsule Take 360 mg by mouth daily.    Historical Provider, MD  diphenoxylate-atropine (LOMOTIL) 2.5-0.025 MG per tablet Take 1 tablet by mouth 4 (four) times daily as needed for diarrhea or loose stools. 07/01/13   [name], MD  DULoxetine (CYMBALTA) 20 MG capsule Take 40 mg by mouth daily.    Historical Provider, MD  gabapentin (NEURONTIN) 300 MG capsule Take 1 capsule (300 mg total) by mouth at bedtime. 05/07/13   Bary Leriche, PA-C  hydrALAZINE (APRESOLINE) 50 MG tablet Take 1 tablet (50 mg total) by mouth 4 (four) times daily. 05/07/13   Bary Leriche, PA-C  HYDROcodone-acetaminophen  (NORCO/VICODIN) 5-325 MG per tablet Take one to two tablets by mouth every 4 hours as needed for moderate pain 05/14/13   Tiffany L Reed, DO  polyethylene glycol powder (GLYCOLAX/MIRALAX) powder  06/16/13   Historical Provider, MD  senna-docusate (SENOKOT-S) 8.6-50 MG per tablet Take 2 tablets by mouth 2 (two) times daily. 05/07/13   Bary Leriche, PA-C  simvastatin (ZOCOR) 10 MG tablet Take 1 tablet (10 mg total) by mouth daily at 6 PM. 10/19/12   Lavon Paganini Angiulli, PA-C  traZODone (DESYREL) 50 MG tablet  06/16/13   Historical Provider, MD  VOLTAREN 1 % GEL  06/16/13   Historical Provider, MD   Triage Vitals: BP 176/73  Pulse 80  Temp(Src) 100.1 F (37.8 C) (Oral)  Ht 5\' 5"  (1.651 m)  Wt 190 lb (86.183 kg)  BMI 31.62 kg/m2  SpO2 93%  Physical Exam  Nursing note and vitals reviewed. Constitutional: She is oriented to person, place, and time. She appears well-developed.  HENT:  Head: Normocephalic.  Eyes: Conjunctivae and EOM are normal. No scleral icterus.  Neck: Neck supple. No thyromegaly present.  Cardiovascular: Normal rate and regular rhythm.  Exam reveals no gallop and no friction rub.   No murmur heard. Pulmonary/Chest: No stridor. She has no wheezes. She has no rales. She exhibits no tenderness.  Abdominal: She exhibits no distension. There is tenderness. There is no rebound.  Mild LUQ and epigastric tenderness.  Musculoskeletal: Normal range of motion. She exhibits no edema.  Lymphadenopathy:    She has no cervical adenopathy.  Neurological: She is oriented to person, place, and time. She exhibits normal muscle tone. Coordination normal.  Left-sided hemiplegia- unable to move left arm, and mild ability to move left leg.  Skin: No rash noted. No erythema.  Psychiatric: She has a normal mood and affect. Her behavior is normal.    ED Course  Procedures (including critical care time)  DIAGNOSTIC STUDIES: Oxygen Saturation is 93% on RA, adequate by my interpretation.     COORDINATION OF CARE: 9:55 AM- Discussed plan to obtain diagnostic lab work and radiology. Will also order medications. Pt advised of plan for treatment and pt agrees.  11:13 AM- Recheck and discussed UTI findings. Discussed plan for admission and pt agrees.  Medications  cefTRIAXone (ROCEPHIN) 1 g in dextrose 5 % 50 mL IVPB (not administered)  ondansetron (ZOFRAN) injection 4 mg (4 mg Intravenous Given 07/25/13 1002)  sodium chloride 0.9 % bolus 500 mL (0 mLs Intravenous Stopped 07/25/13 1041)   Labs Review Labs Reviewed  CBC WITH DIFFERENTIAL - Abnormal; Notable for the following:    Hemoglobin 11.1 (*)    HCT 33.8 (*)    Neutrophils Relative % 90 (*)    Neutro Abs 8.1 (*)    Lymphocytes Relative 5 (*)    Lymphs Abs 0.5 (*)    All other components within normal limits  BASIC METABOLIC PANEL - Abnormal; Notable for the following:    Sodium 134 (*)    Chloride 95 (*)    Glucose, Bld 183 (*)    BUN 36 (*)    Creatinine, Ser 1.93 (*)    GFR calc non Af Amer 27 (*)    GFR calc Af Amer 31 (*)    All other components within normal limits  URINALYSIS, ROUTINE W REFLEX MICROSCOPIC - Abnormal; Notable for the following:    APPearance TURBID (*)    Specific Gravity, Urine >1.030 (*)    Hgb urine dipstick SMALL (*)    Protein, ur 100 (*)    Nitrite POSITIVE (*)    Leukocytes, UA MODERATE (*)    All other components within normal limits  URINE MICROSCOPIC-ADD ON - Abnormal; Notable for the following:    Bacteria, UA MANY (*)    All other components within normal limits  HEPATIC FUNCTION PANEL  LIPASE, BLOOD    Imaging Review Dg Chest Portable 1 View  07/25/2013   CLINICAL DATA:  Vomiting.  EXAM: PORTABLE CHEST - 1 VIEW  COMPARISON:  05/12/2013  FINDINGS: Heart is mildly enlarged. No confluent airspace opacities or effusions. No acute bony abnormality.  IMPRESSION: No acute findings.   Electronically Signed   By: Rolm Baptise M.D.   On: 07/25/2013 10:28     EKG  Interpretation None  MDM   Final diagnoses:  None   The chart was scribed for me under my direct supervision.  I personally performed the history, physical, and medical decision making and all procedures in the evaluation of this patient.Maudry Diego, MD 07/27/13 [phone]

## 2013-07-25 NOTE — H&P (Signed)
[facility] History and Physical  Kelli [name] [id number] DOB: 1950/04/09 DOA: 07/25/2013  Referring physician: Roderic Palau PCP: Manon Hilding, MD   Chief Complaint: emesis  HPI: Kelli [name] is a 63 y.o. female with a past medical history that includes stroke, chronic renal failure stage III, hypertension, presented to the emergency department with the chief complaint of a 3 day history of abdominal pain nausea vomiting. Initial workup in the emergency department consistent with pyelonephritis, acute renal failure.  Information is obtained from the patient and the husband is at the bedside. He indicates that 3 days ago she developed abdominal pain and gradual nausea. He said that at that time she dry heaves. Her by mouth intake was less than usual taking only intermittent fluids. Associated symptoms include subjective fever chills generalized weakness decreased sensorium and one episode of diarrhea. Patient denies any dysuria hematuria frequency or urgency. She does complain of left-sided low back pain and the husband confirms this is chronic. She denies chest pain palpitations shortness of breath headache visual disturbances.  Evaluation in the emergency department reveals urinalysis consistent with UTI, metabolic panel with a serum creatinine of 1.93 sodium 134 BUN 36. Complete blood count reveals a hemoglobin of 11.1, neutrophils 90%. Chest x-ray with no acute findings. He is hemodynamically stable with a temperature of 100.1 orally she is not hypoxic.   In the emergency department she is given normal saline intravenously and Rocephin. He are asked to admit   Review of Systems:  10 point review of systems completed and all systems are negative except as indicated in the history of present illness   Past Medical History  Diagnosis Date  . Essential hypertension, benign   . Fibromyalgia   . Cerebral infarction Aug 2014    Bihemispheric watershed infarcts  . Mixed hyperlipidemia     . Borderline diabetes   . Urinary incontinence   . Carotid artery occlusion     Occluded RICA, status post left CEA  August 2014 - Dr. Donnetta Hutching  . CKD (chronic kidney disease) stage 3, GFR 30-59 ml/min   . 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 Stroke    Past Surgical History  Procedure Laterality Date  . Urethral dilation  1980's  . Combined hysterectomy vaginal w/ mmk / a&p repair  1981  . Vaginal hysterectomy  1981    "partial" (10/04/2012)  . Endarterectomy Left 10/06/2012    Procedure: Carotid Endarterectomy with Finesse patch angioplasty;  Surgeon: [name], MD;  Location: [place];  Service: Vascular;  Laterality: Left;  . Loop recorder implant  04/16/13    MDT LinQ implanted for cryptogenic stroke  . [name] without cardioversion N/A 04/16/2013    Procedure: TRANSESOPHAGEAL ECHOCARDIOGRAM ([name]);  Surgeon: Josue Hector, MD;  Location: [facility] ENDOSCOPY;  Service: Cardiovascular;  Laterality: N/A;   Social History:  reports that she quit smoking about 9 months ago. Her smoking use included Cigarettes. She has a 44 pack-year smoking history. She has never used smokeless tobacco. She reports that she does not drink alcohol or use illicit drugs. Patient is married she lives with her husband. She is complete assist do to complete left hemiplegia. She has a caretaker during the day while her husband is at work. She is nonambulatory Allergies  Allergen Reactions  . Hydrochlorothiazide Nausea And Vomiting    Family History  Problem Relation Age of Onset  . Diabetes Mother   . Diabetes  Father   . Hypertension Sister   . Diabetes Brother   . Seizures Son   . Kidney disease Maternal Grandmother   . Hypertension Maternal Grandmother   . Heart disease Maternal Grandfather   . Diabetes Paternal Grandmother   . Heart disease Paternal Grandfather      Prior to Admission medications   Medication Sig Start Date End Date Taking? Authorizing  Provider  acetaminophen (TYLENOL) 325 MG tablet Take 1-2 tablets (325-650 mg total) by mouth every 4 (four) hours as needed for mild pain. 05/07/13  Yes Ivan Anchors Love, PA-C  amLODipine (NORVASC) 10 MG tablet Take 10 mg by mouth daily.   Yes Historical Provider, MD  aspirin EC 81 MG tablet Take 81 mg by mouth daily.   Yes Historical Provider, MD  atenolol (TENORMIN) 25 MG tablet Take 25 mg by mouth daily.  06/16/13  Yes Historical Provider, MD  clonazePAM (KLONOPIN) 0.5 MG tablet Take 1/2 tablet to (2) half tablets by mouth twice daily as needed for anxiety. Note half tablets 05/09/13  Yes Pricilla Larsson, NP  cloNIDine (CATAPRES - DOSED IN MG/24 HR) 0.3 mg/24hr patch Place 0.3 mg onto the skin once a week. On Tuesday   Yes Historical Provider, MD  clopidogrel (PLAVIX) 75 MG tablet Take 1 tablet (75 mg total) by mouth daily with breakfast. 05/07/13  Yes Ivan Anchors Love, PA-C  diltiazem (CARDIZEM CD) 360 MG 24 hr capsule Take 360 mg by mouth daily.   Yes Historical Provider, MD  diphenoxylate-atropine (LOMOTIL) 2.5-0.025 MG per tablet Take 1 tablet by mouth 4 (four) times daily as needed for diarrhea or loose stools. 07/01/13  Yes [name], MD  DULoxetine (CYMBALTA) 20 MG capsule Take 40 mg by mouth daily.   Yes Historical Provider, MD  gabapentin (NEURONTIN) 300 MG capsule Take 1 capsule (300 mg total) by mouth at bedtime. 05/07/13  Yes Ivan Anchors Love, PA-C  hydrALAZINE (APRESOLINE) 50 MG tablet Take 1 tablet (50 mg total) by mouth 4 (four) times daily. 05/07/13  Yes Ivan Anchors Love, PA-C  HYDROcodone-acetaminophen (NORCO/VICODIN) 5-325 MG per tablet Take one to two tablets by mouth every 4 hours as needed for moderate pain 05/14/13  Yes Tiffany L Reed, DO  Lactobacillus (FLORAJEN ACIDOPHILUS) CAPS Take 1 capsule by mouth daily.   Yes Historical Provider, MD  polyethylene glycol powder (GLYCOLAX/MIRALAX) powder Take 17 g by mouth daily as needed for mild constipation.  06/16/13  Yes Historical Provider, MD    simvastatin (ZOCOR) 10 MG tablet Take 1 tablet (10 mg total) by mouth daily at 6 PM. 10/19/12  Yes Daniel J Angiulli, PA-C  traZODone (DESYREL) 50 MG tablet Take 50 mg by mouth at bedtime as needed for sleep.  06/16/13  Yes Historical Provider, MD   Physical Exam: Filed Vitals:   07/25/13 1103  BP:   Pulse:   Temp: 99.4 F (37.4 C)    BP 176/73  Pulse 80  Temp(Src) 99.4 F (37.4 C) (Oral)  Ht 5\' 5"  (1.651 m)  Wt 86.183 kg (190 lb)  BMI 31.62 kg/m2  SpO2 93%  General:  Somewhat ill appearing no acute distress well-nourished Eyes: PERRL, normal lids, irises & conjunctiva ENT: Ears are clear nose without drainage oropharynx without erythema or exudate. Because the bridge of her mouth are pink but very dry Neck: no LAD, masses or thyromegaly Cardiovascular: Regular rate and rhythm no murmur no gallop no rub trace lower extremity edema on left. Respiratory: Normal effort breath sounds are  clear bilaterally to auscultation. I hear no wheeze no rhonchi Abdomen: Round soft nondistended. Positive bowel sounds somewhat sluggish. Nontender to palpation. No guarding no rebound. No mass organomegaly noted Skin: no rash or induration seen on limited exam Musculoskeletal: Left-sided hemiplegia. Joints without swelling/erythema. Psychiatric: grossly normal mood and affect, speech fluent and appropriate Neurologic: Somewhat lethargic. Attempts to follow commands and respond to questions. He is able to make her needs and wants known. Oriented to self and place. Patient is slow but clear           Labs on Admission:  Basic Metabolic Panel:  Recent Labs Lab 07/25/13 1012  NA 134*  K 4.6  CL 95*  CO2 23  GLUCOSE 183*  BUN 36*  CREATININE 1.93*  CALCIUM 9.2   Liver Function Tests: No results found for this basename: AST, ALT, ALKPHOS, BILITOT, PROT, ALBUMIN,  in the last 168 hours No results found for this basename: LIPASE, AMYLASE,  in the last 168 hours No results found for this  basename: AMMONIA,  in the last 168 hours CBC:  Recent Labs Lab 07/25/13 1012  WBC 9.0  NEUTROABS 8.1*  HGB 11.1*  HCT 33.8*  MCV 86.4  PLT 201   Cardiac Enzymes: No results found for this basename: CKTOTAL, CKMB, CKMBINDEX, TROPONINI,  in the last 168 hours  BNP (last 3 results) No results found for this basename: PROBNP,  in the last 8760 hours CBG: No results found for this basename: GLUCAP,  in the last 168 hours  Radiological Exams on Admission: Dg Chest Portable 1 View  07/25/2013   CLINICAL DATA:  Vomiting.  EXAM: PORTABLE CHEST - 1 VIEW  COMPARISON:  05/12/2013  FINDINGS: Heart is mildly enlarged. No confluent airspace opacities or effusions. No acute bony abnormality.  IMPRESSION: No acute findings.   Electronically Signed   By: Rolm Baptise M.D.   On: 07/25/2013 10:28    EKG:   Assessment/Plan Principal Problem:   Pyelonephritis: Will admit to medical floor. Will continue Rocephin initiated in the emergency department. Will obtain a lactic acid. Provide tylenol for  fever and pain medication as indicated. Await urine culture. Will adjust antibiotics per results of urine culture. Monitor her urine output. Active Problems:  Acute on chronic renal failure: Likely related to decreased by mouth intake secondary to above. Chart review indicates her baseline creatinine range 1.2 1.4. Currently creatinine is 1.9. We'll provide gentle IV hydration. Will hold any nephrotoxins. Will monitor her urine output. Will recheck in the morning. If no improvement will consider renal ultrasound.    Hyperglycemia: Chart review indicates patient with a history of "borderline diabetes". Will obtain a hemoglobin A1c. We'll provide sliding scale insulin for optimal control. Carb modified diet.    HTN (hypertension): Fair control. Systolic blood pressure range 140-170. Home medications include Norvasc, atenolol, clonidine patch, Cardizem, a Apresoline. Will continue these for now with parameters.  On its her close  Hyponatremia: Mild. Likely related to decreased by mouth intake due to #1. Will provide IV fluids and recheck in the a.m.  Anemia: Mild. Echo related to chronic disease. No signs symptoms of active bleeding. Will monitor  Cerebral stroke. March of this year patient admitted with left-sided numbness and weakness MRI showed acute infarct of the left cerebellum. Chart review indicates she had recurrent event while hospitalized confirmed by repeat MRI with right PCA distribution. Carotid Doppler showed patent left CEA site. [name] did not show her left atrial appendage thrombus identified extensive grade 4  aortic atheroma as a potential source of embolus according to cardiology's note dated March 2015. She was treated with aspirin and Plavix, implant of a loop recorder was also placed to exclude any occult arrhythmias.    Obesity: Nutritional consult.    Mixed hyperlipidemia: Will check a lipid panel. Continue her home medication    Paresthesias in left hand: Secondary to stroke Able at baseline. Patient has home health PT twice a week.      Code Status: none Family Communication: husband at bedside Disposition Plan: home when ready  Time spent: 9 minutes  [facility] Pager [phone]  **Disclaimer: This note may have been dictated with voice recognition software. Similar sounding words can inadvertently be transcribed and this note may contain transcription errors which may not have been corrected upon publication of note.**

## 2013-07-25 NOTE — ED Notes (Signed)
Patient would like something to drink at this time. RN aware.

## 2013-07-25 NOTE — ED Notes (Signed)
Vomiting for 2 days, diarrhea x 1 day

## 2013-07-25 NOTE — H&P (Signed)
The patient was seen and examined. She was discussed with nurse practitioner, Ms. Renard Hamper. Agree with her assessment and plan with additions below. This is a 63 year old woman with a history of a previous stroke which left her with left-sided hemiparesis/paralysis, history of chronic kidney disease stage III, and hypertension, who presented with nausea, vomiting, and abdominal pain. Clinical and biochemical findings are consistent with an acute pyelonephritis/urinary tract infection. She is noted to have acute renal failure, likely secondary to prerenal azotemia, but ATN cannot be ruled out. As per Ms. Renard Hamper, she is being treated with ceftriaxone, IV fluid hydration, and supportive care. Will change her diet to full liquids instead of solids until she is consistently eating better and without nausea.

## 2013-07-26 ENCOUNTER — Inpatient Hospital Stay (HOSPITAL_COMMUNITY): Payer: 59

## 2013-07-26 ENCOUNTER — Encounter (HOSPITAL_COMMUNITY): Payer: Self-pay | Admitting: Internal Medicine

## 2013-07-26 DIAGNOSIS — S43006A Unspecified dislocation of unspecified shoulder joint, initial encounter: Secondary | ICD-10-CM

## 2013-07-26 DIAGNOSIS — S43085A Other dislocation of left shoulder joint, initial encounter: Secondary | ICD-10-CM

## 2013-07-26 DIAGNOSIS — S43005A Unspecified dislocation of left shoulder joint, initial encounter: Secondary | ICD-10-CM

## 2013-07-26 HISTORY — DX: Unspecified dislocation of left shoulder joint, initial encounter: S43.005A

## 2013-07-26 LAB — GLUCOSE, CAPILLARY
Glucose-Capillary: 150 mg/dL — ABNORMAL HIGH (ref 70–99)
Glucose-Capillary: 160 mg/dL — ABNORMAL HIGH (ref 70–99)
Glucose-Capillary: 140 mg/dL — ABNORMAL HIGH (ref 70–99)
Glucose-Capillary: 146 mg/dL — ABNORMAL HIGH (ref 70–99)

## 2013-07-26 LAB — HEMOGLOBIN A1C
Hgb A1c MFr Bld: 6 % — ABNORMAL HIGH (ref <5.7)
Mean Plasma Glucose: 126 mg/dL — ABNORMAL HIGH (ref <117)

## 2013-07-26 LAB — BASIC METABOLIC PANEL
BUN: 35 mg/dL — AB (ref 6–23)
CALCIUM: 8.4 mg/dL (ref 8.4–10.5)
CO2: 22 mEq/L (ref 19–32)
CREATININE: 1.71 mg/dL — AB (ref 0.50–1.10)
Chloride: 98 mEq/L (ref 96–112)
GFR calc non Af Amer: 31 mL/min — ABNORMAL LOW (ref >90)
GFR, EST AFRICAN AMERICAN: 36 mL/min — AB (ref >90)
Glucose, Bld: 178 mg/dL — ABNORMAL HIGH (ref 70–99)
Potassium: 3.8 mEq/L (ref 3.7–5.3)
Sodium: 135 mEq/L — ABNORMAL LOW (ref 137–147)

## 2013-07-26 MED ORDER — SIMETHICONE 80 MG PO CHEW
80.0000 mg | CHEWABLE_TABLET | Freq: Four times a day (QID) | ORAL | Status: DC | PRN
  Administered 2013-07-26 – 2013-07-27 (×2): 80 mg via ORAL
  Filled 2013-07-26 (×2): qty 1

## 2013-07-26 NOTE — Progress Notes (Signed)
UR chart review completed.  

## 2013-07-26 NOTE — Progress Notes (Signed)
Patient still with poor appetite this morning.  Ate jello and drank some water with breakfast.  Afraid that other foods or liquids such as juice and milk may upset stomach.  Tolerated well, though, no complaints of N/V.

## 2013-07-26 NOTE — Care Management Note (Addendum)
    Page 1 of 2   07/31/2013     11:04:34 AM CARE MANAGEMENT NOTE 07/31/2013  Patient:  Kelli [name], Kelli [name]   Account Number:  [id number]  Date Initiated:  07/26/2013  Documentation initiated by:  Theophilus Kinds  Subjective/Objective Assessment:   Pt admitted from home with pyelonephritis. Pt lives with her husband and will return home at discharge. Pt has an aide 6 days a week, 8 hours a day. Pt has w/c, hospital bed, shower chair, and BSC. Pt has started outpt PT at [facility]     Action/Plan:   Hospital rehab. Will continue to follow for discharge needs. Pt was just d/c'd from [facility] services.   Anticipated DC Date:  07/30/2013   Anticipated DC Plan:  [place]  CM consult      [facility] Choice  HOME HEALTH   Choice offered to / List presented to:  C-1 Patient        [place] arranged  [place] [facility]      [place].   Status of service:  Completed, signed off Medicare Important Message given?   (If response is "NO", the following Medicare IM given date fields will be blank) Date Medicare IM given:   Date Additional Medicare IM given:    Discharge Disposition:  HOME/SELF CARE  Per UR Regulation:    If discussed at Long Length of Stay Meetings, dates discussed:   07/31/2013    Comments:  07/31/13 [place], RN BSN CM Pt to be discharged home today with [facility] PT (per pts choice). Emma with [facility] is aware and will collect the pts information from the chart. [place] services to start within 48 hours of discharge. No DME needs noted. Pt and pts nurse aware of discharge arrangements.  07/26/13 [place], RN BSN CM

## 2013-07-26 NOTE — Progress Notes (Signed)
[facility] PROGRESS NOTE  Kelli [name] [id number] DOB: September 10, 1950 DOA: 07/25/2013 PCP: Manon Hilding, MD    Code Status: Full code Family Communication: Family not available Disposition Plan: Discharge to home when clinically appropriate.    Consultants:  Orthopedic surgery pending  Procedures:  None  Antibiotics:  Ceftriaxone 07/25/13.  HPI/Subjective: The patient says that she feels better. She denies nausea or vomiting. She denies diarrhea. When questioned, she had some chest tightness earlier. She believes it could be gas. She has no chest pain now. She denied left shoulder pain until she was questioned. When questioned, she acknowledges that she has had left shoulder pain intermittently for some time now.  Objective: Filed Vitals:   07/26/13 1411  BP: 137/52  Pulse: 62  Temp: 98 F (36.7 C)  Resp: 20    Intake/Output Summary (Last 24 hours) at 07/26/13 1726 Last data filed at 07/26/13 1230  Gross per 24 hour  Intake   1160 ml  Output      0 ml  Net   1160 ml   Filed Weights   07/25/13 0932 07/25/13 1251 07/26/13 0522  Weight: 86.183 kg (190 lb) 92 kg (202 lb 13.2 oz) 93 kg (205 lb 0.4 oz)    Exam:   General: 63 year old Caucasian woman sitting up in bed, in no acute distress. She is more alert.  Cardiovascular: S1, S2, with a 2/6 systolic murmur.  Respiratory: Clear anteriorly with decreased breath sounds in the bases.  Abdomen: Obese, positive bowel sounds, soft, nontender, nondistended.  Musculoskeletal: The patient is unable to raise her left arm secondary to previous stroke. However she does have moderate tenderness upon palpation of her left shoulder.   Data Reviewed: Basic Metabolic Panel:  Recent Labs Lab 07/25/13 1012 07/26/13 0600  NA 134* 135*  K 4.6 3.8  CL 95* 98  CO2 23 22  GLUCOSE 183* 178*  BUN 36* 35*  CREATININE 1.93* 1.71*  CALCIUM 9.2 8.4   Liver Function Tests:  Recent Labs Lab 07/25/13 1012  AST 15   ALT 16  ALKPHOS 100  BILITOT 0.5  PROT 7.3  ALBUMIN 2.8*    Recent Labs Lab 07/25/13 1012  LIPASE 24   No results found for this basename: AMMONIA,  in the last 168 hours CBC:  Recent Labs Lab 07/25/13 1012  WBC 9.0  NEUTROABS 8.1*  HGB 11.1*  HCT 33.8*  MCV 86.4  PLT 201   Cardiac Enzymes: No results found for this basename: CKTOTAL, CKMB, CKMBINDEX, TROPONINI,  in the last 168 hours BNP (last 3 results) No results found for this basename: PROBNP,  in the last 8760 hours CBG:  Recent Labs Lab 07/25/13 1703 07/25/13 2105 07/26/13 0758 07/26/13 1125 07/26/13 1628  GLUCAP 147* 158* 150* 160* 140*    No results found for this or any previous visit (from the past 240 hour(s)).   Studies: Dg Chest Port 1 View  07/26/2013   CLINICAL DATA:  Chest tightness.  EXAM: PORTABLE CHEST - 1 VIEW  COMPARISON:  07/25/2013 and 05/12/2013  FINDINGS: The heart size and mediastinal contours are within normal limits. Both lungs are clear.  There is marked subluxation or dislocation of the proximal left humerus which is new since 04/17/2013.  IMPRESSION: Heart and lungs appear normal. Marked subluxation or dislocation of the proximal left humerus. This is new since 04/17/2013.   Electronically Signed   By: Rozetta Nunnery M.D.   On: 07/26/2013 14:53   Dg Chest Portable 1  View  07/25/2013   CLINICAL DATA:  Vomiting.  EXAM: PORTABLE CHEST - 1 VIEW  COMPARISON:  05/12/2013  FINDINGS: Heart is mildly enlarged. No confluent airspace opacities or effusions. No acute bony abnormality.  IMPRESSION: No acute findings.   Electronically Signed   By: Rolm Baptise M.D.   On: 07/25/2013 10:28    Scheduled Meds: . amLODipine  10 mg Oral Daily  . aspirin EC  81 mg Oral Daily  . atenolol  25 mg Oral Daily  . cefTRIAXone (ROCEPHIN)  IV  1 g Intravenous Q24H  . cloNIDine  0.3 mg Transdermal Weekly  . clopidogrel  75 mg Oral Q breakfast  . diltiazem  360 mg Oral Daily  . DULoxetine  40 mg Oral Daily   . enoxaparin (LOVENOX) injection  40 mg Subcutaneous Q24H  . gabapentin  300 mg Oral QHS  . hydrALAZINE  50 mg Oral QID  . insulin aspart  0-15 Units Subcutaneous TID WC  . insulin aspart  0-5 Units Subcutaneous QHS  . simvastatin  10 mg Oral q1800   Continuous Infusions: . sodium chloride 75 mL/hr at 07/26/13 1054   Assessment/plan:  Principal Problem:   Pyelonephritis Active Problems:   Hyperglycemia   Hyponatremia   Paresthesias in left hand   HTN (hypertension)   Obesity   Mixed hyperlipidemia   Acute on chronic renal failure   Anemia   Closed dislocation of left humerus Transient chest tightness-chest x-ray reveals no acute cardiopulmonary abnormalities. EKG is unremarkable. Patient believes this is gas.   1. Continue current management including IV fluid hydration and antibiotics. 2. Advanced diet. 3. Gas-X/simethicone. 4. Discontinue contact cautioned that the patient denies diarrhea. 5. Consult orthopedic surgery for dislocated left humerus. (The patient has to be lifted daily and dislocation may have been inadvertent. Also, she undergoes occupational therapy as an outpatient).  Time spent: 35 minutes.    Rexene Alberts  [facility] Pager [phone]. If 7PM-7AM, please contact night-coverage at [email or web address], password [facility] 07/26/2013, 5:26 PM  LOS: 1 day

## 2013-07-27 ENCOUNTER — Inpatient Hospital Stay (HOSPITAL_COMMUNITY): Payer: 59

## 2013-07-27 ENCOUNTER — Ambulatory Visit: Payer: 59 | Admitting: Cardiology

## 2013-07-27 DIAGNOSIS — G8110 Spastic hemiplegia affecting unspecified side: Secondary | ICD-10-CM

## 2013-07-27 DIAGNOSIS — S43002A Unspecified subluxation of left shoulder joint, initial encounter: Secondary | ICD-10-CM | POA: Diagnosis present

## 2013-07-27 DIAGNOSIS — I6350 Cerebral infarction due to unspecified occlusion or stenosis of unspecified cerebral artery: Secondary | ICD-10-CM

## 2013-07-27 DIAGNOSIS — N182 Chronic kidney disease, stage 2 (mild): Secondary | ICD-10-CM

## 2013-07-27 DIAGNOSIS — M25519 Pain in unspecified shoulder: Secondary | ICD-10-CM

## 2013-07-27 LAB — BASIC METABOLIC PANEL
BUN: 32 mg/dL — AB (ref 6–23)
CO2: 22 mEq/L (ref 19–32)
Calcium: 8.3 mg/dL — ABNORMAL LOW (ref 8.4–10.5)
Chloride: 100 mEq/L (ref 96–112)
Creatinine, Ser: 1.48 mg/dL — ABNORMAL HIGH (ref 0.50–1.10)
GFR calc non Af Amer: 37 mL/min — ABNORMAL LOW (ref >90)
GFR, EST AFRICAN AMERICAN: 43 mL/min — AB (ref >90)
Glucose, Bld: 164 mg/dL — ABNORMAL HIGH (ref 70–99)
Potassium: 3.7 mEq/L (ref 3.7–5.3)
SODIUM: 137 meq/L (ref 137–147)

## 2013-07-27 LAB — GLUCOSE, CAPILLARY
Glucose-Capillary: 166 mg/dL — ABNORMAL HIGH (ref 70–99)
Glucose-Capillary: 142 mg/dL — ABNORMAL HIGH (ref 70–99)
Glucose-Capillary: 139 mg/dL — ABNORMAL HIGH (ref 70–99)
Glucose-Capillary: 116 mg/dL — ABNORMAL HIGH (ref 70–99)

## 2013-07-27 LAB — CBC
HEMATOCRIT: 29.1 % — AB (ref 36.0–46.0)
HEMOGLOBIN: 9.3 g/dL — AB (ref 12.0–15.0)
MCH: 27.5 pg (ref 26.0–34.0)
MCHC: 32 g/dL (ref 30.0–36.0)
MCV: 86.1 fL (ref 78.0–100.0)
Platelets: 185 10*3/uL (ref 150–400)
RBC: 3.38 MIL/uL — ABNORMAL LOW (ref 3.87–5.11)
RDW: 14.5 % (ref 11.5–15.5)
WBC: 9.8 10*3/uL (ref 4.0–10.5)

## 2013-07-27 MED ORDER — FAMOTIDINE IN NACL 20-0.9 MG/50ML-% IV SOLN
20.0000 mg | Freq: Two times a day (BID) | INTRAVENOUS | Status: DC
Start: 2013-07-27 — End: 2013-07-30
  Administered 2013-07-27 – 2013-07-29 (×5): 20 mg via INTRAVENOUS
  Filled 2013-07-27 (×6): qty 50

## 2013-07-27 MED ORDER — HYDRALAZINE HCL 20 MG/ML IJ SOLN
10.0000 mg | Freq: Once | INTRAMUSCULAR | Status: DC
  Filled 2013-07-27: qty 1

## 2013-07-27 NOTE — Progress Notes (Addendum)
INITIAL NUTRITION ASSESSMENT  DOCUMENTATION CODES Per approved criteria  -Obesity Unspecified   INTERVENTION: Resource Breeze po BID, each supplement provides 250 kcal and 9 grams of protein   NUTRITION DIAGNOSIS:  Inadequate oral intake related to poor appetite as evidenced by nursing report  Goal: Pt will increase meal intake to meet 75% energy and 100% protein needs  Monitor:  Po meal and supplement intake, labs and wt trends    Reason for Assessment: Consult due to pt's poor appetite  63 y.o. female  Admitting Dx: Pyelonephritis  ASSESSMENT: Pt hx includes chronic kidney dz (Stage 3), stroke (left hemiplegia) and hypertension. She presents with abdominal pain on admission.  Pt home diet is regular and thin liquids. She is able to feed herself. Pt c/o poor appetite and decreased appetite past several months. She has been trying to eat bland foods such as toast and banana for breakfast. Hx of swallow difficulty since her stroke in August 2014. Pt weight hx variable; question etiology of 21#(9.5 kg) wt gain <30 days.  Pt has a spouse and caregivers daily. Pt unable to participate fully with nutrition exam due to hemiplegia. No physical signs of malnutrition observed (orbital, temporal regions) at this time. She is at risk for malnutrition but does not meet criteria at this time.  Height: Ht Readings from Last 1 Encounters:  07/25/13 5\' 5"  (1.651 m)    Weight: Wt Readings from Last 1 Encounters:  07/27/13 211 lb 6.7 oz (95.9 kg)    Ideal Body Weight: 125# (56.8 kg)  % Ideal Body Weight: 169%  Wt Readings from Last 10 Encounters:  07/27/13 211 lb 6.7 oz (95.9 kg)  07/01/13 190 lb (86.183 kg)  05/28/13 204 lb (92.534 kg)  05/22/13 214 lb (97.07 kg)  05/21/13 214 lb (97.07 kg)  05/19/13 206 lb 6.4 oz (93.622 kg)  05/02/13 192 lb 11.2 oz (87.408 kg)  04/08/13 217 lb 11.2 oz (98.748 kg)  04/08/13 217 lb 11.2 oz (98.748 kg)  04/08/13 217 lb 11.2 oz (98.748 kg)     Usual Body Weight: 205-210#  % Usual Body Weight: 100%  BMI:  Body mass index is 35.18 kg/(m^2). obesity  Estimated Nutritional Needs: Kcal: 1600-1710 Protein: 80-90 gr Fluid: >1700 ml daily  Skin: intact  Diet Order: Carb Control  EDUCATION NEEDS: -Education needs addressed. Heart Healthy diet edu provided by RD during hospital stay 10/17/12.   Intake/Output Summary (Last 24 hours) at 07/27/13 0933 Last data filed at 07/27/13 [id number]  Gross per 24 hour  Intake 2356.25 ml  Output      0 ml  Net 2356.25 ml    Last BM: 07/25/13  Labs:   Recent Labs Lab 07/25/13 1012 07/26/13 0600 07/27/13 0606  NA 134* 135* 137  K 4.6 3.8 3.7  CL 95* 98 100  CO2 23 22 22   BUN 36* 35* 32*  CREATININE 1.93* 1.71* 1.48*  CALCIUM 9.2 8.4 8.3*  GLUCOSE 183* 178* 164*    CBG (last 3)   Recent Labs  07/26/13 1628 07/26/13 2037 07/27/13 0729  GLUCAP 140* 146* 166*    Scheduled Meds: . amLODipine  10 mg Oral Daily  . aspirin EC  81 mg Oral Daily  . atenolol  25 mg Oral Daily  . cefTRIAXone (ROCEPHIN)  IV  1 g Intravenous Q24H  . cloNIDine  0.3 mg Transdermal Weekly  . clopidogrel  75 mg Oral Q breakfast  . diltiazem  360 mg Oral Daily  . DULoxetine  40  mg Oral Daily  . enoxaparin (LOVENOX) injection  40 mg Subcutaneous Q24H  . gabapentin  300 mg Oral QHS  . hydrALAZINE  50 mg Oral QID  . insulin aspart  0-15 Units Subcutaneous TID WC  . insulin aspart  0-5 Units Subcutaneous QHS  . simvastatin  10 mg Oral q1800    Continuous Infusions: . sodium chloride 75 mL/hr at 07/27/13 0039    Past Medical History  Diagnosis Date  . Essential hypertension, benign   . Fibromyalgia   . Cerebral infarction Aug 2014    Bihemispheric watershed infarcts  . Mixed hyperlipidemia   . Borderline diabetes   . Urinary incontinence   . Carotid artery occlusion     Occluded RICA, status post left CEA  August 2014 - Dr. Donnetta Hutching  . CKD (chronic kidney disease) stage 3, GFR 30-59  ml/min   . 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 Stroke   . Closed dislocation of left humerus 07/26/2013    Past Surgical History  Procedure Laterality Date  . Urethral dilation  1980's  . Combined hysterectomy vaginal w/ mmk / a&p repair  1981  . Vaginal hysterectomy  1981    "partial" (10/04/2012)  . Endarterectomy Left 10/06/2012    Procedure: Carotid Endarterectomy with Finesse patch angioplasty;  Surgeon: [name], MD;  Location: [facility];  Service: Vascular;  Laterality: Left;  . Loop recorder implant  04/16/13    MDT LinQ implanted for cryptogenic stroke  . [name] without cardioversion N/A 04/16/2013    Procedure: TRANSESOPHAGEAL ECHOCARDIOGRAM ([name]);  Surgeon: Josue Hector, MD;  Location: [facility];  Service: Cardiovascular;  Laterality: N/A;    Colman Cater MS,RD,CSG,LDN Office: [phone] Pager: [phone]

## 2013-07-27 NOTE — Progress Notes (Signed)
[facility] PROGRESS NOTE  [name] [id number] DOB: Jul 24, 1960 DOA: 07/25/2013 PCP: Manon Hilding, MD    Code Status: Full code Family Communication: Family not available Disposition Plan: Discharge to home when clinically appropriate.    Consultants:  Orthopedic surgery   Procedures:  None  Antibiotics:  Ceftriaxone 07/25/13>>  HPI/Subjective: The patient says that she doesn't feel well today. She has some generalized nausea but no vomiting. Her last bowel movement was 2 days ago. She has no specific complaints of bladder pain.. She denies nausea or vomiting.  Objective: Filed Vitals:   07/27/13 1433  BP: 150/70  Pulse: 75  Temp: 98 F (36.7 C)  Resp: 18    Intake/Output Summary (Last 24 hours) at 07/27/13 1632 Last data filed at 07/27/13 1300  Gross per 24 hour  Intake   1345 ml  Output      0 ml  Net   1345 ml   Filed Weights   07/25/13 1251 07/26/13 0522 07/27/13 0154  Weight: 92 kg (202 lb 13.2 oz) 93 kg (205 lb 0.4 oz) 95.9 kg (211 lb 6.7 oz)    Exam:   General: 63 year old Caucasian woman sitting up in bed, in no acute distress, but she appears more ill today.  Cardiovascular: S1, S2, with a 2/6 systolic murmur.  Respiratory: Clear anteriorly with decreased breath sounds in the bases.  Abdomen: Obese, positive bowel sounds, soft, nontender, nondistended.  Musculoskeletal: The patient is unable to raise her left arm secondary to previous stroke. However she does have mild tenderness upon palpation of her left shoulder.   She is alert and oriented to herself in the hospital. She has a very faint left facial droop. She is nearly hemiplegic on the left. Strength is 5 over 5 of the right upper and right lower extremity.  Data Reviewed: Basic Metabolic Panel:  Recent Labs Lab 07/25/13 1012 07/26/13 0600 07/27/13 0606  NA 134* 135* 137  K 4.6 3.8 3.7  CL 95* 98 100  CO2 23 22 22   GLUCOSE 183* 178* 164*  BUN 36* 35* 32*   CREATININE 1.93* 1.71* 1.48*  CALCIUM 9.2 8.4 8.3*   Liver Function Tests:  Recent Labs Lab 07/25/13 1012  AST 15  ALT 16  ALKPHOS 100  BILITOT 0.5  PROT 7.3  ALBUMIN 2.8*    Recent Labs Lab 07/25/13 1012  LIPASE 24   No results found for this basename: AMMONIA,  in the last 168 hours CBC:  Recent Labs Lab 07/25/13 1012 07/27/13 0606  WBC 9.0 9.8  NEUTROABS 8.1*  --   HGB 11.1* 9.3*  HCT 33.8* 29.1*  MCV 86.4 86.1  PLT 201 185   Cardiac Enzymes: No results found for this basename: CKTOTAL, CKMB, CKMBINDEX, TROPONINI,  in the last 168 hours BNP (last 3 results) No results found for this basename: PROBNP,  in the last 8760 hours CBG:  Recent Labs Lab 07/26/13 1125 07/26/13 1628 07/26/13 2037 07/27/13 0729 07/27/13 1127  GLUCAP 160* 140* 146* 166* 142*    Recent Results (from the past 240 hour(s))  URINE CULTURE     Status: None   Collection Time    07/25/13 11:47 AM      Result Value Ref Range Status   Specimen Description URINE, CLEAN CATCH   Final   Special Requests NONE   Final   Culture  Setup Time     Final   Value: 07/26/2013 13:21     Performed at [facility]  [place] Count     Final   Value: >=100,000 COLONIES/ML     Performed at [facility]   Culture     Final   Value: ESCHERICHIA COLI     Performed at [facility]   Report Status PENDING   Incomplete     Studies: Dg Chest Port 1 View  07/26/2013   CLINICAL DATA:  Chest tightness.  EXAM: PORTABLE CHEST - 1 VIEW  COMPARISON:  07/25/2013 and 05/12/2013  FINDINGS: The heart size and mediastinal contours are within normal limits. Both lungs are clear.  There is marked subluxation or dislocation of the proximal left humerus which is new since 04/17/2013.  IMPRESSION: Heart and lungs appear normal. Marked subluxation or dislocation of the proximal left humerus. This is new since 04/17/2013.   Electronically Signed   By: Rozetta Nunnery M.D.   On: 07/26/2013 14:53   Dg  Shoulder Left  07/26/2013   CLINICAL DATA:  Pain.  EXAM: LEFT SHOULDER - 2+ VIEW  COMPARISON:  None.  FINDINGS: Degenerative changes about the left shoulder. Subluxation may be present. There is no evidence of fracture or dislocation. No evidence of separation.  IMPRESSION: Degenerative change. There is no evidence of fracture, dislocation, or separation.   Electronically Signed   By: [place]   On: 07/26/2013 20:40   Dg Abd Portable 1v  07/27/2013   CLINICAL DATA:  Nausea. Can do portable.  EXAM: PORTABLE ABDOMEN - 1 VIEW  COMPARISON:  None.  FINDINGS: No disproportionate dilatation of bowel. No obvious free intraperitoneal gas.  IMPRESSION: Nonobstructive bowel gas pattern.   Electronically Signed   By: Maryclare Bean M.D.   On: 07/27/2013 15:32    Scheduled Meds: . amLODipine  10 mg Oral Daily  . aspirin EC  81 mg Oral Daily  . atenolol  25 mg Oral Daily  . cefTRIAXone (ROCEPHIN)  IV  1 g Intravenous Q24H  . cloNIDine  0.3 mg Transdermal Weekly  . clopidogrel  75 mg Oral Q breakfast  . diltiazem  360 mg Oral Daily  . DULoxetine  40 mg Oral Daily  . enoxaparin (LOVENOX) injection  40 mg Subcutaneous Q24H  . gabapentin  300 mg Oral QHS  . hydrALAZINE  50 mg Oral QID  . insulin aspart  0-15 Units Subcutaneous TID WC  . insulin aspart  0-5 Units Subcutaneous QHS  . simvastatin  10 mg Oral q1800   Continuous Infusions: . sodium chloride 75 mL/hr at 07/27/13 1525   Assessment/plan:  Principal Problem:   Pyelonephritis Active Problems:   Hyperglycemia   Hyponatremia   Paresthesias in left hand   HTN (hypertension)   Obesity   Mixed hyperlipidemia   Acute on chronic renal failure   Anemia   Subluxation of left shoulder joint   1. UTI/acute pyelonephritis. Her urine culture is growing out Escherichia coli. We'll continue Rocephin. We'll await sensitivities.  Nausea. Etiology may be secondary to pyelonephritis; query polypharmacy. Abdominal x-ray ordered and revealed  nonobstructive bowel gas pattern. Her lipase and LFTs were within normal limits on admission. Will add IV Pepcid and downgraded her diet to full liquids.  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 Her baseline creatinine is 1.2-1.4. Her renal function is improving towards baseline. We'll continue gentle IV fluids.  Hyponatremia, secondary to hypovolemia. Resolved with IV fluids.  Hypertension. Fair control. We'll continue amlodipine, atenolol, diltiazem, and hydralazine.  Transient chest pain. Her chest x-ray revealed  normal heart and lungs. Her EKG revealed normal sinus rhythm with no ST or T wave abnormalities. The patient attributed her symptomatology to "gas. Simethicone was started when necessary. She denies chest pain currently.  Hyperglycemia/query prediabetes. She reports "borderline diabetes. Her hemoglobin A1c is 6.0. Will continue sliding scale NovoLog.  History of stroke. Continue aspirin and Plavix.  Anemia. Her anemia is likely chronic, but has decreased because of the dilutional effects of IV fluids. No evidence of gross GI bleeding, but because of chronic antiplatelet therapy, Pepcid was added.  Subluxation left shoulder. Orthopedic surgery was consulted when the patient's chest x-ray revealed a possible dislocation. Dr. Aline Brochure ordered a dedicated left shoulder x-ray. It revealed subluxation but no dislocation. He recommended left upper extremity functional positional splint and for her to continue outpatient occupational and physical therapy.  Discontinue contact precautions as the patient has no diarrhea.     Time spent: 30 minutes.    Rexene Alberts  [facility] Pager [phone]. If 7PM-7AM, please contact night-coverage at [email or web address], password [facility] 07/27/2013, 4:32 PM  LOS: 2 days

## 2013-07-27 NOTE — Consult Note (Signed)
Reason for Consult:dislocation shoudler Referring Physician: Dr Fuller Canada  Kelli [name] is an 63 y.o. female.   The history is noted below. Time in last evaluated the patient for shoulder dislocation. Reviewing the patient's record indicates that the patient has been evaluated for this prior inpatient rehabilitation or Dr. Elnita Maxwell. She also indicates that she did wear a hand wrist splint but did not take it home with her. She is scheduled to be seen in the morning at outpatient rehabilitation Center  She says that she cannot move her arm at all but she does a home exercise program for passive range of motion. She is also noted to have weakness in the left lower extremity with minimal function as well.  On last evening there was a chest x-ray which showed a possible dislocation subluxation of the left shoulder with no witnessed dislocating event. A trauma series was performed and found the shoulder to be reduced.  Chief Complaint: emesis  HPI: Kelli [name] is a 63 y.o. female with a past medical history that includes stroke, chronic renal failure stage III, hypertension, presented to the emergency department with the chief complaint of a 3 day history of abdominal pain nausea vomiting. Initial workup in the emergency department consistent with pyelonephritis, acute renal failure.  Information is obtained from the patient and the husband is at the bedside. He indicates that 3 days ago she developed abdominal pain and gradual nausea. He said that at that time she dry heaves. Her by mouth intake was less than usual taking only intermittent fluids. Associated symptoms include subjective fever chills generalized weakness decreased sensorium and one episode of diarrhea. Patient denies any dysuria hematuria frequency or urgency. She does complain of left-sided low back pain and the husband confirms this is chronic. She denies chest pain palpitations shortness of breath headache visual  disturbances.  Evaluation in the emergency department reveals urinalysis consistent with UTI, metabolic panel with a serum creatinine of 1.93 sodium 134 BUN 36. Complete blood count reveals a hemoglobin of 11.1, neutrophils 90%. Chest x-ray with no acute findings. He is hemodynamically stable with a temperature of 100.1 orally she is not hypoxic.    In the emergency department she is given normal saline intravenously and Rocephin. He are asked to admit Review of Systems:  10 point review of systems completed and all systems are negative except as indicated in the history of present illness    Past Medical History  Diagnosis Date  . Essential hypertension, benign   . Fibromyalgia   . Cerebral infarction Aug 2014    Bihemispheric watershed infarcts  . Mixed hyperlipidemia   . Borderline diabetes   . Urinary incontinence   . Carotid artery occlusion     Occluded RICA, status post left CEA  August 2014 - Dr. Donnetta Hutching  . CKD (chronic kidney disease) stage 3, GFR 30-59 ml/min   . 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 Stroke   . Closed dislocation of left humerus 07/26/2013    Past Surgical History  Procedure Laterality Date  . Urethral dilation  1980's  . Combined hysterectomy vaginal w/ mmk / a&p repair  1981  . Vaginal hysterectomy  1981    "partial" (10/04/2012)  . Endarterectomy Left 10/06/2012    Procedure: Carotid Endarterectomy with Finesse patch angioplasty;  Surgeon: [name], MD;  Location: [facility];  Service: Vascular;  Laterality: Left;  . Loop recorder implant  04/16/13    MDT LinQ implanted for cryptogenic stroke  . [name] without cardioversion N/A 04/16/2013    Procedure: TRANSESOPHAGEAL ECHOCARDIOGRAM ([name]);  Surgeon: Josue Hector, MD;  Location: [facility] ENDOSCOPY;  Service: Cardiovascular;  Laterality: N/A;    Family History  Problem Relation Age of Onset  . Diabetes Mother   . Diabetes Father   . Hypertension Sister    . Diabetes Brother   . Seizures Son   . Kidney disease Maternal Grandmother   . Hypertension Maternal Grandmother   . Heart disease Maternal Grandfather   . Diabetes Paternal Grandmother   . Heart disease Paternal Grandfather     Social History:  reports that she quit smoking about 9 months ago. Her smoking use included Cigarettes. She has a 44 pack-year smoking history. She has never used smokeless tobacco. She reports that she does not drink alcohol or use illicit drugs.  Allergies:  Allergies  Allergen Reactions  . Hydrochlorothiazide Nausea And Vomiting    Medications: I have reviewed the patient's current medications.  Results for orders placed during the hospital encounter of 07/25/13 (from the past 48 hour(s))  URINE CULTURE     Status: None   Collection Time    07/25/13 11:47 AM      Result Value Ref Range   Specimen Description URINE, CLEAN CATCH     Special Requests NONE     Culture  Setup Time       Value: 07/26/2013 13:21     Performed at [facility] Count       Value: >=100,000 COLONIES/ML     Performed at [facility]   Culture       Value: ESCHERICHIA COLI     Performed at [facility]   Report Status PENDING    LACTIC ACID, PLASMA     Status: None   Collection Time    07/25/13  1:42 PM      Result Value Ref Range   Lactic Acid, Venous 1.3  0.5 - 2.2 mmol/L  GLUCOSE, CAPILLARY     Status: Abnormal   Collection Time    07/25/13  5:03 PM      Result Value Ref Range   Glucose-Capillary 147 (*) 70 - 99 mg/dL   Comment 1 Notify RN     Comment 2 Documented in Chart    GLUCOSE, CAPILLARY     Status: Abnormal   Collection Time    07/25/13  9:05 PM      Result Value Ref Range   Glucose-Capillary 158 (*) 70 - 99 mg/dL   Comment 1 Notify RN     Comment 2 Documented in Chart    BASIC METABOLIC PANEL     Status: Abnormal   Collection Time    07/26/13  6:00 AM      Result Value Ref Range   Sodium 135 (*) 137 - 147 mEq/L    Potassium 3.8  3.7 - 5.3 mEq/L   Comment: DELTA CHECK NOTED   Chloride 98  96 - 112 mEq/L   CO2 22  19 - 32 mEq/L   Glucose, Bld 178 (*) 70 - 99 mg/dL   BUN 35 (*) 6 - 23 mg/dL   Creatinine, Ser 1.71 (*) 0.50 - 1.10 mg/dL   Calcium 8.4  8.4 - 10.5 mg/dL   GFR calc non Af Amer 31 (*) >90 mL/min   GFR calc Af Amer 36 (*) >90 mL/min   Comment: (NOTE)  The eGFR has been calculated using the CKD EPI equation.     This calculation has not been validated in all clinical situations.     eGFR's persistently <90 mL/min signify possible Chronic Kidney     Disease.  HEMOGLOBIN A1C     Status: Abnormal   Collection Time    07/26/13  6:00 AM      Result Value Ref Range   Hemoglobin A1C 6.0 (*) <5.7 %   Comment: (NOTE)                                                                               According to the ADA Clinical Practice Recommendations for 2011, when     HbA1c is used as a screening test:      >=6.5%   Diagnostic of Diabetes Mellitus               (if abnormal result is confirmed)     5.7-6.4%   Increased risk of developing Diabetes Mellitus     References:Diagnosis and Classification of Diabetes Mellitus,Diabetes     QHUT,6546,50(PTWSF 1):S62-S69 and Standards of Medical Care in             Diabetes - 2011,Diabetes Care,2011,34 (Suppl 1):S11-S61.   Mean Plasma Glucose 126 (*) <117 mg/dL   Comment: Performed at [facility], CAPILLARY     Status: Abnormal   Collection Time    07/26/13  7:58 AM      Result Value Ref Range   Glucose-Capillary 150 (*) 70 - 99 mg/dL   Comment 1 Notify RN    GLUCOSE, CAPILLARY     Status: Abnormal   Collection Time    07/26/13 11:25 AM      Result Value Ref Range   Glucose-Capillary 160 (*) 70 - 99 mg/dL   Comment 1 Notify RN    GLUCOSE, CAPILLARY     Status: Abnormal   Collection Time    07/26/13  4:28 PM      Result Value Ref Range   Glucose-Capillary 140 (*) 70 - 99 mg/dL   Comment 1 Notify RN     Comment 2  Documented in Chart    GLUCOSE, CAPILLARY     Status: Abnormal   Collection Time    07/26/13  8:37 PM      Result Value Ref Range   Glucose-Capillary 146 (*) 70 - 99 mg/dL   Comment 1 Notify RN     Comment 2 Documented in Chart    CBC     Status: Abnormal   Collection Time    07/27/13  6:06 AM      Result Value Ref Range   WBC 9.8  4.0 - 10.5 K/uL   RBC 3.38 (*) 3.87 - 5.11 MIL/uL   Hemoglobin 9.3 (*) 12.0 - 15.0 g/dL   HCT 29.1 (*) 36.0 - 46.0 %   MCV 86.1  78.0 - 100.0 fL   MCH 27.5  26.0 - 34.0 pg   MCHC 32.0  30.0 - 36.0 g/dL   RDW 14.5  11.5 - 15.5 %   Platelets 185  150 - 400 K/uL  BASIC METABOLIC PANEL  Status: Abnormal   Collection Time    07/27/13  6:06 AM      Result Value Ref Range   Sodium 137  137 - 147 mEq/L   Potassium 3.7  3.7 - 5.3 mEq/L   Chloride 100  96 - 112 mEq/L   CO2 22  19 - 32 mEq/L   Glucose, Bld 164 (*) 70 - 99 mg/dL   BUN 32 (*) 6 - 23 mg/dL   Creatinine, Ser 1.48 (*) 0.50 - 1.10 mg/dL   Calcium 8.3 (*) 8.4 - 10.5 mg/dL   GFR calc non Af Amer 37 (*) >90 mL/min   GFR calc Af Amer 43 (*) >90 mL/min   Comment: (NOTE)     The eGFR has been calculated using the CKD EPI equation.     This calculation has not been validated in all clinical situations.     eGFR's persistently <90 mL/min signify possible Chronic Kidney     Disease.  GLUCOSE, CAPILLARY     Status: Abnormal   Collection Time    07/27/13  7:29 AM      Result Value Ref Range   Glucose-Capillary 166 (*) 70 - 99 mg/dL   Comment 1 Notify RN      Dg Chest Port 1 View  07/26/2013   CLINICAL DATA:  Chest tightness.  EXAM: PORTABLE CHEST - 1 VIEW  COMPARISON:  07/25/2013 and 05/12/2013  FINDINGS: The heart size and mediastinal contours are within normal limits. Both lungs are clear.  There is marked subluxation or dislocation of the proximal left humerus which is new since 04/17/2013.  IMPRESSION: Heart and lungs appear normal. Marked subluxation or dislocation of the proximal left  humerus. This is new since 04/17/2013.   Electronically Signed   By: Rozetta Nunnery M.D.   On: 07/26/2013 14:53   Dg Shoulder Left  07/26/2013   CLINICAL DATA:  Pain.  EXAM: LEFT SHOULDER - 2+ VIEW  COMPARISON:  None.  FINDINGS: Degenerative changes about the left shoulder. Subluxation may be present. There is no evidence of fracture or dislocation. No evidence of separation.  IMPRESSION: Degenerative change. There is no evidence of fracture, dislocation, or separation.   Electronically Signed   By: Marcello Moores  Register   On: 07/26/2013 20:40    ROS Blood pressure 148/68, pulse 71, temperature 98 F (36.7 C), temperature source Oral, resp. rate 16, height _0  (1.651 m), weight 211 lb 6.7 oz (95.9 kg), SpO2 91.00%. Physical Exam  General the patient normally developed  She appears to be oriented to person and place  Mood and affect flat  Ambulation could not assess, indicates that she stands and pivots on the right with moderate to maximum assist to get out of bed Inspection of the left shoulder reveals normal contour without the appearance of dislocation or subluxation. She does have decreased internal/external rotation abduction and forward elevation but does not have instability. She is 0/5 strength. Skin is clean dry and intact. The elbow wrist and hand are also flaccid. 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 She has a pseudosubluxation of the left shoulder secondary to muscle weakness and muscle imbalance.  She should have passive range of motion to the left upper extremity with functional position splint  As far as outpatient followup the patient should continue with her preadmission plan of occupational and physical therapy at [facility] outpatient center  and further recommendations per Dr Helmut Muster 07/27/2013, 10:38 AM

## 2013-07-27 NOTE — Progress Notes (Signed)
[place] for Rocephin Indication: UTI  Allergies  Allergen Reactions  . Hydrochlorothiazide Nausea And Vomiting   Patient Measurements: Height: 5\' 5"  (165.1 cm) Weight: 211 lb 6.7 oz (95.9 kg) IBW/kg (Calculated) : 57  Vital Signs: Temp: 98 F (36.7 C) (05/29 0154) Temp src: Oral (05/29 0154) BP: 148/68 mmHg (05/29 0154) Pulse Rate: 71 (05/29 0154) Intake/Output from previous day: 05/28 0701 - 05/29 0700 In: 2496.3 [P.O.:660; I.V.:1836.3] Out: -  Intake/Output from this shift: Total I/O In: 60 [P.O.:60] Out: -   Labs:  Recent Labs  07/25/13 1012 07/26/13 0600 07/27/13 0606  WBC 9.0  --  9.8  HGB 11.1*  --  9.3*  PLT 201  --  185  CREATININE 1.93* 1.71* 1.48*   Estimated Creatinine Clearance: 45.2 ml/min (by C-G formula based on Cr of 1.48). No results found for this basename: VANCOTROUGH, VANCOPEAK, VANCORANDOM, GENTTROUGH, GENTPEAK, GENTRANDOM, TOBRATROUGH, TOBRAPEAK, TOBRARND, AMIKACINPEAK, AMIKACINTROU, AMIKACIN,  in the last 72 hours   Microbiology: No results found for this or any previous visit (from the past 720 hour(s)).  Medical History: Past Medical History  Diagnosis Date  . Essential hypertension, benign   . Fibromyalgia   . Cerebral infarction Aug 2014    Bihemispheric watershed infarcts  . Mixed hyperlipidemia   . Borderline diabetes   . Urinary incontinence   . Carotid artery occlusion     Occluded RICA, status post left CEA  August 2014 - Dr. Donnetta Hutching  . CKD (chronic kidney disease) stage 3, GFR 30-59 ml/min   . 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 Stroke   . Closed dislocation of left humerus 07/26/2013   Assessment: 63yo female presented with c/o abdominal pain.  Rocephin was started for possible pyelonephritis.   She remains afebrile with normal WBC.  Urine cx NGTD.   Rocephin does not require renal dose adjustments.   Rocephin 5/27>>  Goal  of Therapy:  Eradicate infection.  Plan:  Continue Rocephin 1gm IV q24hrs Duration of therapy per MD.  Recommend change to oral therapy once appropriate.  Pharmacy will sign off since no dose adjustments needed.  Please re-consult if needed.   Lavonia Drafts Rebeka Kimble 07/27/2013,9:16 AM

## 2013-07-27 NOTE — Progress Notes (Signed)
Shift Summary 7p-7a:   Pt remained alert and oriented to previously documented baseline.  Pt assisted OOB to [place] for Xray completion and then returned to bed x2 assist, pt tolerated well. Norco given x1 PRN for c/o of chronic left shoulder pain, good relief noted. Safety/falls protocol maintained, no injuries this shift. Pt turned/repositioned every 2 hours by staff to prevent skin breakdown, heels elevated bilaterally (blanchable erythema noted), moisture barrier applied for incontinence. Nutrition/dietician consult placed for poor appetite. Tolerating PO liquids well. Fingerstick blood sugars monitored and managed. Pt refused SCDs, education provided and pt verbalized understanding. IV fluids continue to infuse without difficulty. Pt bathed this shift, rested well throughout the evening, will continue with plan of care.   Danella Deis, RN 5:23 AM 07/27/2013

## 2013-07-27 NOTE — Telephone Encounter (Signed)
Attempted to call pt again. There was no answer.
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
## 2013-07-28 DIAGNOSIS — N390 Urinary tract infection, site not specified: Secondary | ICD-10-CM

## 2013-07-28 DIAGNOSIS — A498 Other bacterial infections of unspecified site: Secondary | ICD-10-CM

## 2013-07-28 LAB — GLUCOSE, CAPILLARY
GLUCOSE-CAPILLARY: 198 mg/dL — AB (ref 70–99)
Glucose-Capillary: 121 mg/dL — ABNORMAL HIGH (ref 70–99)
Glucose-Capillary: 155 mg/dL — ABNORMAL HIGH (ref 70–99)
Glucose-Capillary: 142 mg/dL — ABNORMAL HIGH (ref 70–99)

## 2013-07-28 LAB — URINE CULTURE: Colony Count: >=100000

## 2013-07-28 LAB — BASIC METABOLIC PANEL
BUN: 23 mg/dL (ref 6–23)
CO2: 21 mEq/L (ref 19–32)
Calcium: 8.3 mg/dL — ABNORMAL LOW (ref 8.4–10.5)
Chloride: 99 mEq/L (ref 96–112)
Creatinine, Ser: 1.18 mg/dL — ABNORMAL HIGH (ref 0.50–1.10)
GFR calc Af Amer: 56 mL/min — ABNORMAL LOW (ref >90)
GFR, EST NON AFRICAN AMERICAN: 48 mL/min — AB (ref >90)
Glucose, Bld: 123 mg/dL — ABNORMAL HIGH (ref 70–99)
Potassium: 3.3 mEq/L — ABNORMAL LOW (ref 3.7–5.3)
Sodium: 136 mEq/L — ABNORMAL LOW (ref 137–147)

## 2013-07-28 LAB — CBC
HCT: 29.9 % — ABNORMAL LOW (ref 36.0–46.0)
Hemoglobin: 9.7 g/dL — ABNORMAL LOW (ref 12.0–15.0)
MCH: 28 pg (ref 26.0–34.0)
MCHC: 32.4 g/dL (ref 30.0–36.0)
MCV: 86.2 fL (ref 78.0–100.0)
Platelets: 226 10*3/uL (ref 150–400)
RBC: 3.47 MIL/uL — ABNORMAL LOW (ref 3.87–5.11)
RDW: 14.5 % (ref 11.5–15.5)
WBC: 8.7 10*3/uL (ref 4.0–10.5)

## 2013-07-28 LAB — HEPATIC FUNCTION PANEL
ALK PHOS: 110 U/L (ref 39–117)
ALT: 14 U/L (ref 0–35)
AST: 12 U/L (ref 0–37)
Albumin: 2 g/dL — ABNORMAL LOW (ref 3.5–5.2)
Total Bilirubin: 0.2 mg/dL — ABNORMAL LOW (ref 0.3–1.2)
Total Protein: 5.8 g/dL — ABNORMAL LOW (ref 6.0–8.3)

## 2013-07-28 NOTE — Progress Notes (Addendum)
Patient refusing IV fluids at this time.  [name] states she doesn't think the fluids are helping her.  IV disconnected per patient request. Patient also refusing sling at this time.  States it will be difficult for her to sleep with her arm in sling. Removed per patient request.

## 2013-07-28 NOTE — Progress Notes (Signed)
Documentation error.

## 2013-07-28 NOTE — Progress Notes (Signed)
[facility] PROGRESS NOTE  Kelli [name] [id number] DOB: Aug 05, 1950 DOA: 07/25/2013 PCP: Manon Hilding, MD    Code Status: Full code Family Communication: Family not available Disposition Plan: Discharge to home when clinically appropriate.    Consultants:  Orthopedic surgery   Procedures:  None  Antibiotics:  Ceftriaxone 07/25/13>>  HPI/Subjective: The patient says that she doesn't feel well today. She has some generalized nausea but no vomiting. Her last bowel movement was 2 days ago. She has no specific complaints of bladder pain.. She denies nausea or vomiting.  Objective: Filed Vitals:   07/28/13 1346  BP: 117/73  Pulse: 62  Temp: 97.7 F (36.5 C)  Resp: 20    Intake/Output Summary (Last 24 hours) at 07/28/13 1843 Last data filed at 07/28/13 1757  Gross per 24 hour  Intake 1765.25 ml  Output      0 ml  Net 1765.25 ml   Filed Weights   07/27/13 0154 07/28/13 0409 07/28/13 0830  Weight: 95.9 kg (211 lb 6.7 oz) 95.5 kg (210 lb 8.6 oz) 95.8 kg (211 lb 3.2 oz)    Exam:   General: 63 year old Caucasian woman sitting up in bed, in no acute distress, but she appears more ill today.  Cardiovascular: S1, S2, with a 2/6 systolic murmur.  Respiratory: Clear anteriorly with decreased breath sounds in the bases.  Abdomen: Obese, positive bowel sounds, soft, nontender, nondistended.  Musculoskeletal: The patient is unable to raise her left arm secondary to previous stroke. However she does have mild tenderness upon palpation of her left shoulder.   She is alert and oriented to herself in the hospital. She has a very faint left facial droop. She is nearly hemiplegic on the left. Strength is 5 over 5 of the right upper and right lower extremity.  Data Reviewed: Basic Metabolic Panel:  Recent Labs Lab 07/25/13 1012 07/26/13 0600 07/27/13 0606 07/28/13 0546  NA 134* 135* 137 136*  K 4.6 3.8 3.7 3.3*  CL 95* 98 100 99  CO2 23 22 22 21   GLUCOSE  183* 178* 164* 123*  BUN 36* 35* 32* 23  CREATININE 1.93* 1.71* 1.48* 1.18*  CALCIUM 9.2 8.4 8.3* 8.3*   Liver Function Tests:  Recent Labs Lab 07/25/13 1012 07/28/13 0546  AST 15 12  ALT 16 14  ALKPHOS 100 110  BILITOT 0.5 0.2*  PROT 7.3 5.8*  ALBUMIN 2.8* 2.0*    Recent Labs Lab 07/25/13 1012  LIPASE 24   No results found for this basename: AMMONIA,  in the last 168 hours CBC:  Recent Labs Lab 07/25/13 1012 07/27/13 0606 07/28/13 0546  WBC 9.0 9.8 8.7  NEUTROABS 8.1*  --   --   HGB 11.1* 9.3* 9.7*  HCT 33.8* 29.1* 29.9*  MCV 86.4 86.1 86.2  PLT 201 185 226   Cardiac Enzymes: No results found for this basename: CKTOTAL, CKMB, CKMBINDEX, TROPONINI,  in the last 168 hours BNP (last 3 results) No results found for this basename: PROBNP,  in the last 8760 hours CBG:  Recent Labs Lab 07/27/13 1714 07/27/13 2107 07/28/13 0735 07/28/13 1139 07/28/13 1610  GLUCAP 139* 116* 121* 198* 155*    Recent Results (from the past 240 hour(s))  URINE CULTURE     Status: None   Collection Time    07/25/13 11:47 AM      Result Value Ref Range Status   Specimen Description URINE, CLEAN CATCH   Final   Special Requests NONE   Final  Culture  Setup Time     Final   Value: 07/26/2013 13:21     Performed at [place]     Final   Value: >=100,000 COLONIES/ML     Performed at [facility]   Culture     Final   Value: ESCHERICHIA COLI     Performed at [facility]   Report Status PENDING   Incomplete     Studies: Dg Shoulder Left  07/26/2013   CLINICAL DATA:  Pain.  EXAM: LEFT SHOULDER - 2+ VIEW  COMPARISON:  None.  FINDINGS: Degenerative changes about the left shoulder. Subluxation may be present. There is no evidence of fracture or dislocation. No evidence of separation.  IMPRESSION: Degenerative change. There is no evidence of fracture, dislocation, or separation.   Electronically Signed   By: [place]   On:  07/26/2013 20:40   Dg Abd Portable 1v  07/27/2013   CLINICAL DATA:  Nausea. Can do portable.  EXAM: PORTABLE ABDOMEN - 1 VIEW  COMPARISON:  None.  FINDINGS: No disproportionate dilatation of bowel. No obvious free intraperitoneal gas.  IMPRESSION: Nonobstructive bowel gas pattern.   Electronically Signed   By: Maryclare Bean M.D.   On: 07/27/2013 15:32    Scheduled Meds: . amLODipine  10 mg Oral Daily  . aspirin EC  81 mg Oral Daily  . atenolol  25 mg Oral Daily  . cefTRIAXone (ROCEPHIN)  IV  1 g Intravenous Q24H  . cloNIDine  0.3 mg Transdermal Weekly  . clopidogrel  75 mg Oral Q breakfast  . diltiazem  360 mg Oral Daily  . DULoxetine  40 mg Oral Daily  . enoxaparin (LOVENOX) injection  40 mg Subcutaneous Q24H  . famotidine (PEPCID) IV  20 mg Intravenous Q12H  . gabapentin  300 mg Oral QHS  . hydrALAZINE  50 mg Oral QID  . insulin aspart  0-15 Units Subcutaneous TID WC  . insulin aspart  0-5 Units Subcutaneous QHS  . simvastatin  10 mg Oral q1800   Continuous Infusions: . sodium chloride 60 mL/hr at 07/28/13 0636   Assessment/plan:  Principal Problem:   Pyelonephritis Active Problems:   HTN (hypertension)   Obesity   Mixed hyperlipidemia   Paresthesias in left hand   Acute on chronic renal failure   Hyperglycemia   Anemia   Hyponatremia   Subluxation of left shoulder joint   CKD (chronic kidney disease) stage 2, GFR 60-89 ml/min   1. UTI/acute pyelonephritis. Her urine culture is growing out Escherichia coli. We'll continue Rocephin. We'll await sensitivities.  Nausea. Etiology may be secondary to pyelonephritis; query polypharmacy. Abdominal x-ray ordered and revealed nonobstructive bowel gas pattern. Her lipase and LFTs were within normal limits on admission. She was started on IV pepcid and appears to be improving.  Advance diet.  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 Her baseline creatinine is 1.2-1.4. Her renal function is  improving towards baseline. We'll continue gentle IV fluids.  Hyponatremia, secondary to hypovolemia. Resolved with IV fluids.  Hypertension. Fair control. We'll continue amlodipine, atenolol, diltiazem, and hydralazine.  Transient chest pain. Her chest x-ray revealed normal heart and lungs. Her EKG revealed normal sinus rhythm with no ST or T wave abnormalities. The patient attributed her symptomatology to "gas. Simethicone was started when necessary. She denies chest pain currently.  Hyperglycemia/query prediabetes. She reports "borderline diabetes. Her hemoglobin A1c is 6.0. Will continue sliding scale  NovoLog.  History of stroke. Continue aspirin and Plavix.  Anemia. Her anemia is likely chronic, but has decreased because of the dilutional effects of IV fluids. No evidence of gross GI bleeding, but because of chronic antiplatelet therapy, Pepcid was added.  Subluxation left shoulder. Orthopedic surgery was consulted when the patient's chest x-ray revealed a possible dislocation. Dr. Aline Brochure ordered a dedicated left shoulder x-ray. It revealed subluxation but no dislocation. He recommended left upper extremity functional positional splint and for her to continue outpatient occupational and physical therapy.  Confusion. Has been reports that patient has been increasingly confused today. May possibly be related to infection. We'll continue to follow. Discontinue narcotics.  Discontinue contact precautions as the patient has no diarrhea.     Time spent: 30 minutes.    Kathie Dike  [facility] Pager [phone]. If 7PM-7AM, please contact night-coverage at [email or web address], password [facility] 07/28/2013, 6:43 PM  LOS: 3 days

## 2013-07-29 DIAGNOSIS — N183 Chronic kidney disease, stage 3 unspecified: Secondary | ICD-10-CM

## 2013-07-29 LAB — BASIC METABOLIC PANEL
BUN: 22 mg/dL (ref 6–23)
CALCIUM: 8.2 mg/dL — AB (ref 8.4–10.5)
CO2: 22 mEq/L (ref 19–32)
CREATININE: 1.27 mg/dL — AB (ref 0.50–1.10)
Chloride: 100 mEq/L (ref 96–112)
GFR, EST AFRICAN AMERICAN: 51 mL/min — AB (ref >90)
GFR, EST NON AFRICAN AMERICAN: 44 mL/min — AB (ref >90)
Glucose, Bld: 129 mg/dL — ABNORMAL HIGH (ref 70–99)
Potassium: 3.2 mEq/L — ABNORMAL LOW (ref 3.7–5.3)
Sodium: 136 mEq/L — ABNORMAL LOW (ref 137–147)

## 2013-07-29 LAB — AMMONIA: Ammonia: 22 umol/L (ref 11–60)

## 2013-07-29 LAB — CBC
HCT: 29.8 % — ABNORMAL LOW (ref 36.0–46.0)
Hemoglobin: 9.7 g/dL — ABNORMAL LOW (ref 12.0–15.0)
MCH: 28.2 pg (ref 26.0–34.0)
MCHC: 32.6 g/dL (ref 30.0–36.0)
MCV: 86.6 fL (ref 78.0–100.0)
PLATELETS: 262 10*3/uL (ref 150–400)
RBC: 3.44 MIL/uL — AB (ref 3.87–5.11)
RDW: 14.7 % (ref 11.5–15.5)
WBC: 10.1 10*3/uL (ref 4.0–10.5)

## 2013-07-29 LAB — GLUCOSE, CAPILLARY
GLUCOSE-CAPILLARY: 128 mg/dL — AB (ref 70–99)
Glucose-Capillary: 185 mg/dL — ABNORMAL HIGH (ref 70–99)
Glucose-Capillary: 183 mg/dL — ABNORMAL HIGH (ref 70–99)
Glucose-Capillary: 155 mg/dL — ABNORMAL HIGH (ref 70–99)

## 2013-07-29 MED ORDER — POTASSIUM CHLORIDE CRYS ER 20 MEQ PO TBCR
40.0000 meq | EXTENDED_RELEASE_TABLET | Freq: Once | ORAL | Status: AC
  Administered 2013-07-30: 40 meq via ORAL

## 2013-07-29 NOTE — Progress Notes (Signed)
[facility] PROGRESS NOTE  Kelli [name] [id number] DOB: [birth year] DOA: 07/25/2013 PCP: Manon Hilding, MD    Code Status: Full code Family Communication: Discussed with patient's husband Disposition Plan: Discharge to home when clinically appropriate.    Consultants:  Orthopedic surgery   Procedures:  None  Antibiotics:  Ceftriaxone 07/25/13>>  HPI/Subjective: No vomiting today.  Feels weak and has difficulty walking. No shortness of breath or chest pain. Feels confused at times but is aware that she feels confused.  Objective: Filed Vitals:   07/29/13 1400  BP: 137/63  Pulse: 62  Temp: 98.2 F (36.8 C)  Resp: 20    Intake/Output Summary (Last 24 hours) at 07/29/13 1731 Last data filed at 07/29/13 1255  Gross per 24 hour  Intake   1980 ml  Output      0 ml  Net   1980 ml   Filed Weights   07/28/13 0830 07/29/13 0537 07/29/13 1400  Weight: 95.8 kg (211 lb 3.2 oz) 97.2 kg (214 lb 4.6 oz) 97.4 kg (214 lb 11.7 oz)    Exam:   General: 63 year old Caucasian woman sitting up in bed, in no acute distress, but she appears more ill today.  Cardiovascular: S1, S2, with a 2/6 systolic murmur.  Respiratory: Clear anteriorly with decreased breath sounds in the bases.  Abdomen: Obese, positive bowel sounds, soft, nontender, nondistended.  Musculoskeletal: The patient is unable to raise her left arm secondary to previous stroke. However she does have mild tenderness upon palpation of her left shoulder.   She is alert and oriented to herself in the hospital. She has a very faint left facial droop. She is nearly hemiplegic on the left. Strength is 5 over 5 of the right upper and right lower extremity.  Data Reviewed: Basic Metabolic Panel:  Recent Labs Lab 07/25/13 1012 07/26/13 0600 07/27/13 0606 07/28/13 0546 07/29/13 0534  NA 134* 135* 137 136* 136*  K 4.6 3.8 3.7 3.3* 3.2*  CL 95* 98 100 99 100  CO2 23 22 22 21 22   GLUCOSE 183* 178* 164* 123*  129*  BUN 36* 35* 32* 23 22  CREATININE 1.93* 1.71* 1.48* 1.18* 1.27*  CALCIUM 9.2 8.4 8.3* 8.3* 8.2*   Liver Function Tests:  Recent Labs Lab 07/25/13 1012 07/28/13 0546  AST 15 12  ALT 16 14  ALKPHOS 100 110  BILITOT 0.5 0.2*  PROT 7.3 5.8*  ALBUMIN 2.8* 2.0*    Recent Labs Lab 07/25/13 1012  LIPASE 24   No results found for this basename: AMMONIA,  in the last 168 hours CBC:  Recent Labs Lab 07/25/13 1012 07/27/13 0606 07/28/13 0546 07/29/13 0534  WBC 9.0 9.8 8.7 10.1  NEUTROABS 8.1*  --   --   --   HGB 11.1* 9.3* 9.7* 9.7*  HCT 33.8* 29.1* 29.9* 29.8*  MCV 86.4 86.1 86.2 86.6  PLT 201 185 226 262   Cardiac Enzymes: No results found for this basename: CKTOTAL, CKMB, CKMBINDEX, TROPONINI,  in the last 168 hours BNP (last 3 results) No results found for this basename: PROBNP,  in the last 8760 hours CBG:  Recent Labs Lab 07/28/13 1139 07/28/13 1610 07/28/13 2113 07/29/13 0726 07/29/13 1100  GLUCAP 198* 155* 142* 128* 185*    Recent Results (from the past 240 hour(s))  URINE CULTURE     Status: None   Collection Time    07/25/13 11:47 AM      Result Value Ref Range Status   Specimen Description  URINE, CLEAN CATCH   Final   Special Requests NONE   Final   Culture  Setup Time     Final   Value: 07/26/2013 13:21     Performed at [place]     Final   Value: >=100,000 COLONIES/ML     Performed at [facility]   Culture     Final   Value: ESCHERICHIA COLI     Performed at [facility]   Report Status 07/28/2013 FINAL   Final   Organism ID, Bacteria ESCHERICHIA COLI   Final     Studies: No results found.  Scheduled Meds: . amLODipine  10 mg Oral Daily  . aspirin EC  81 mg Oral Daily  . atenolol  25 mg Oral Daily  . cefTRIAXone (ROCEPHIN)  IV  1 g Intravenous Q24H  . cloNIDine  0.3 mg Transdermal Weekly  . clopidogrel  75 mg Oral Q breakfast  . diltiazem  360 mg Oral Daily  . DULoxetine  40 mg  Oral Daily  . enoxaparin (LOVENOX) injection  40 mg Subcutaneous Q24H  . famotidine (PEPCID) IV  20 mg Intravenous Q12H  . gabapentin  300 mg Oral QHS  . hydrALAZINE  50 mg Oral QID  . insulin aspart  0-15 Units Subcutaneous TID WC  . insulin aspart  0-5 Units Subcutaneous QHS  . simvastatin  10 mg Oral q1800   Continuous Infusions:   Assessment/plan:  Principal Problem:   Pyelonephritis Active Problems:   HTN (hypertension)   Obesity   Mixed hyperlipidemia   Paresthesias in left hand   Acute on chronic renal failure   Hyperglycemia   Anemia   Hyponatremia   Subluxation of left shoulder joint   CKD (chronic kidney disease) stage 2, GFR 60-89 ml/min   1. UTI/acute pyelonephritis. Her urine culture is growing out Escherichia coli. We'll continue Rocephin. Can likely transition to cipro/bactrim on discharge  Nausea. Etiology may be secondary to pyelonephritis; query polypharmacy. Abdominal x-ray ordered and revealed nonobstructive bowel gas pattern. Her lipase and LFTs were within normal limits on admission. She was started on IV pepcid and appears to be improving.  Advance diet. No further vomiting today.  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 Her baseline creatinine is 1.2-1.4. Her renal function is improving towards baseline.   Hyponatremia, secondary to hypovolemia. Resolved with IV fluids.  Hypertension. Fair control. We'll continue amlodipine, atenolol, diltiazem, and hydralazine.  Transient chest pain. Her chest x-ray revealed normal heart and lungs. Her EKG revealed normal sinus rhythm with no ST or T wave abnormalities. The patient attributed her symptomatology to "gas. Simethicone was started when necessary. She denies chest pain currently.  Hyperglycemia/query prediabetes. She reports "borderline diabetes. Her hemoglobin A1c is 6.0. Will continue sliding scale NovoLog.  History of stroke. Continue aspirin and  Plavix.  Anemia. Her anemia is likely chronic, but hemoglobin has decreased because of the dilutional effects of IV fluids. No evidence of gross GI bleeding, but because of chronic antiplatelet therapy, Pepcid was added.  Subluxation left shoulder. Orthopedic surgery was consulted when the patient's chest x-ray revealed a possible dislocation. Dr. Aline Brochure ordered a dedicated left shoulder x-ray. It revealed subluxation but no dislocation. He recommended left upper extremity functional positional splint and for her to continue outpatient occupational and physical therapy.  Confusion. Narcotics discontinued on 5/30.  She still appears to be confused.  May be residual/slow to improve effects  of UTI.  Check ammonia. Discussed with husband and he feels that patient is still confused, but may be doing a little better today than yesterday.  She is oriented to time and person but has some difficulty with place. Will continue to monitor.  Discontinue contact precautions as the patient has no diarrhea.     Time spent: 30 minutes.    Kathie Dike  [facility] Pager [phone]. If 7PM-7AM, please contact night-coverage at [email or web address], password [facility] 07/29/2013, 5:31 PM  LOS: 4 days

## 2013-07-30 ENCOUNTER — Inpatient Hospital Stay (HOSPITAL_COMMUNITY): Payer: 59

## 2013-07-30 DIAGNOSIS — D72829 Elevated white blood cell count, unspecified: Secondary | ICD-10-CM | POA: Diagnosis not present

## 2013-07-30 DIAGNOSIS — D473 Essential (hemorrhagic) thrombocythemia: Secondary | ICD-10-CM | POA: Diagnosis not present

## 2013-07-30 DIAGNOSIS — D75839 Thrombocytosis, unspecified: Secondary | ICD-10-CM | POA: Diagnosis not present

## 2013-07-30 LAB — CBC
HCT: 33 % — ABNORMAL LOW (ref 36.0–46.0)
Hemoglobin: 10.6 g/dL — ABNORMAL LOW (ref 12.0–15.0)
MCH: 27.9 pg (ref 26.0–34.0)
MCHC: 32.1 g/dL (ref 30.0–36.0)
MCV: 86.8 fL (ref 78.0–100.0)
PLATELETS: 408 10*3/uL — AB (ref 150–400)
RBC: 3.8 MIL/uL — AB (ref 3.87–5.11)
RDW: 14.7 % (ref 11.5–15.5)
WBC: 12.6 10*3/uL — ABNORMAL HIGH (ref 4.0–10.5)

## 2013-07-30 LAB — BASIC METABOLIC PANEL
BUN: 16 mg/dL (ref 6–23)
CALCIUM: 8.6 mg/dL (ref 8.4–10.5)
CO2: 25 meq/L (ref 19–32)
Chloride: 101 mEq/L (ref 96–112)
Creatinine, Ser: 1.2 mg/dL — ABNORMAL HIGH (ref 0.50–1.10)
GFR calc Af Amer: 55 mL/min — ABNORMAL LOW (ref >90)
GFR calc non Af Amer: 47 mL/min — ABNORMAL LOW (ref >90)
GLUCOSE: 134 mg/dL — AB (ref 70–99)
Potassium: 3.2 mEq/L — ABNORMAL LOW (ref 3.7–5.3)
SODIUM: 140 meq/L (ref 137–147)

## 2013-07-30 LAB — GLUCOSE, CAPILLARY
GLUCOSE-CAPILLARY: 136 mg/dL — AB (ref 70–99)
Glucose-Capillary: 121 mg/dL — ABNORMAL HIGH (ref 70–99)
Glucose-Capillary: 109 mg/dL — ABNORMAL HIGH (ref 70–99)

## 2013-07-30 LAB — RETICULOCYTES
RBC.: 3.61 MIL/uL — ABNORMAL LOW (ref 3.87–5.11)
Retic Count, Absolute: 61.4 10*3/uL (ref 19.0–186.0)
Retic Ct Pct: 1.7 % (ref 0.4–3.1)

## 2013-07-30 LAB — FOLATE: FOLATE: 7.2 ng/mL

## 2013-07-30 LAB — MAGNESIUM: Magnesium: 1.9 mg/dL (ref 1.5–2.5)

## 2013-07-30 LAB — VITAMIN B12: Vitamin B-12: 564 pg/mL (ref 211–911)

## 2013-07-30 LAB — FERRITIN: Ferritin: 278 ng/mL (ref 10–291)

## 2013-07-30 LAB — IRON AND TIBC
IRON: 20 ug/dL — AB (ref 42–135)
SATURATION RATIOS: 9 % — AB (ref 20–55)
TIBC: 233 ug/dL — ABNORMAL LOW (ref 250–470)
UIBC: 213 ug/dL (ref 125–400)

## 2013-07-30 MED ORDER — FAMOTIDINE 20 MG PO TABS
20.0000 mg | ORAL_TABLET | Freq: Two times a day (BID) | ORAL | Status: DC
  Administered 2013-07-30 – 2013-07-31 (×3): 20 mg via ORAL
  Filled 2013-07-30 (×3): qty 1

## 2013-07-30 MED ORDER — POTASSIUM CHLORIDE CRYS ER 20 MEQ PO TBCR
40.0000 meq | EXTENDED_RELEASE_TABLET | ORAL | Status: AC
  Administered 2013-07-30 (×2): 40 meq via ORAL
  Filled 2013-07-30: qty 2

## 2013-07-30 NOTE — Progress Notes (Addendum)
The patient was seen and examined. She was discussed with nurse practitioner, Ms. Renard Hamper. Agree with her assessment and plan. This afternoon, the patient is sitting up in a chair getting ready eat crackers with peanut butter. She says her appetite is improving and nausea is resolving but she does not like the hospital food. She denies pain with urination, cough, and diarrhea. Her chest x-ray this afternoon reveals no acute cardiopulmonary changes and specifically no infiltrates. The etiology of her leukocytosis is unclear as the patient is not febrile and she is on appropriate antibiotic therapy with Rocephin. We'll continue potassium chloride supplementation. Magnesium level ordered for tomorrow morning and is pending. Anemia panel ordered and is pending.  Anticipate discharge to home with appropriate therapy in the next 24-48 hours depending on symptomatology and vital signs.  Discussed the patient's clinical course and anticipated discharge with her husband earlier.

## 2013-07-30 NOTE — Plan of Care (Signed)
Problem: Phase I Progression Outcomes Goal: OOB as tolerated unless otherwise ordered Outcome: Progressing Patient oob to chair approx. 2 hours today.

## 2013-07-30 NOTE — Progress Notes (Signed)
The patient is receiving Pepcid by the intravenous route.  Based on criteria approved by the Pharmacy and [place], the medication is being converted to the equivalent oral dose form.  These criteria include: -No Active GI bleeding -Able to tolerate diet of full liquids (or better) or tube feeding OR able to tolerate other medications by the oral or enteral route  If you have any questions about this conversion, please contact the Pharmacy Department (ext [phone]).  Thank you.  [place], [facility] 07/30/2013 9:08 AM

## 2013-07-30 NOTE — Progress Notes (Signed)
[facility] PROGRESS NOTE  Kelli [name] [id number] DOB: 20-Nov-1950 DOA: 07/25/2013 PCP: Manon Hilding, MD  Assessment/Plan: 1. UTI/acute pyelonephritis. Her urine culture is growing out Escherichia coli. We'll continue Rocephin. Can likely transition to cipro/bactrim on discharge   Nausea. Improved this am but patient refusing food due to "i want to sleep". Abdominal x-ray ordered and revealed nonobstructive bowel gas pattern. Her lipase and LFTs were within normal limits on admission.  No further vomiting.   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 Her baseline creatinine is 1.2-1.4. Her renal function is at baseline.   Hyponatremia, secondary to hypovolemia. Resolved with IV fluids.   Hypertension. Fair control. We'll continue amlodipine, atenolol, diltiazem, and hydralazine.   Transient chest pain. Her chest x-ray revealed normal heart and lungs. Her EKG revealed normal sinus rhythm with no ST or T wave abnormalities. The patient attributed her symptomatology to "gas. Simethicone was started when necessary. No further episodes of chest pain  Hyperglycemia/query prediabetes. She reports "borderline diabetes. Her hemoglobin A1c is 6.0. CBG range 136-155. Will continue sliding scale NovoLog.   History of stroke. Continue aspirin and Plavix.   Anemia. Her anemia is likely chronic, but hemoglobin has decreased because of the dilutional effects of IV fluids. No evidence of gross GI bleeding, but because of chronic antiplatelet therapy, Pepcid was added. Hg trending up slightly today  Subluxation left shoulder. Orthopedic surgery was consulted when the patient's chest x-ray revealed a possible dislocation. Dr. Aline Brochure ordered a dedicated left shoulder x-ray. It revealed subluxation but no dislocation. He recommended left upper extremity functional positional splint and for her to continue outpatient occupational and physical therapy.    Confusion. Narcotics discontinued on 5/30. She appears less confused but irritable today.  May be residual/slow to improve effects of UTI. Ammonia level within the limits of normal. She is oriented to time and person.  Will continue to monitor.   Discontinue contact precautions as the patient has no diarrhea.   Leukocytosis: may be reactive. She is afebrile and non-toxic appearing. Will obtain chest xray, encourage incentive spirometry and get patient OOB. Monitor  Hypokalemia: will replete and check magnesium level as well. Recheck in am.   Thrombocytosis: may be reactive. Chart review indicates recent hx of same during hospitalization for stroke. Will check anemia panel. Monitor. If no improvement consider peripheral smear.     Code Status: full Family Communication: none present Disposition Plan: home when ready   Consultants:  Dr Aline Brochure orthopedics  Procedures:  none  Antibiotics:  Ceftriaxone 07/25/13>>  HPI/Subjective: Awake, somewhat irritable. Complains of feeling "tired and people keep bursting in here and wont let me sleep". Denies abdominal pain or nausea. Refusing breakfast in favor of "i would rather sleep".  Objective: Filed Vitals:   07/30/13 0606  BP: 148/84  Pulse: 85  Temp: 98 F (36.7 C)  Resp: 20    Intake/Output Summary (Last 24 hours) at 07/30/13 1019 Last data filed at 07/30/13 0900  Gross per 24 hour  Intake   1410 ml  Output      0 ml  Net   1410 ml   Filed Weights   07/29/13 0537 07/29/13 1400 07/30/13 0606  Weight: 97.2 kg (214 lb 4.6 oz) 97.4 kg (214 lb 11.7 oz) 95.5 kg (210 lb 8.6 oz)    Exam:   General:  Well nourished. NAD  Cardiovascular: S1 and S2 regular +murmur no LE edema  Respiratory: normal effort. BS clear bilaterally  no wheeze or rhonchi  Abdomen: obese soft +BS non-tender to palpation  Musculoskeletal: left shoulder with sling intact and tender to touch. Strength on right side 5/5. Left side weak from  previous stroke  Data Reviewed: Basic Metabolic Panel:  Recent Labs Lab 07/26/13 0600 07/27/13 0606 07/28/13 0546 07/29/13 0534 07/30/13 0513  NA 135* 137 136* 136* 140  K 3.8 3.7 3.3* 3.2* 3.2*  CL 98 100 99 100 101  CO2 22 22 21 22 25   GLUCOSE 178* 164* 123* 129* 134*  BUN 35* 32* 23 22 16   CREATININE 1.71* 1.48* 1.18* 1.27* 1.20*  CALCIUM 8.4 8.3* 8.3* 8.2* 8.6   Liver Function Tests:  Recent Labs Lab 07/25/13 1012 07/28/13 0546  AST 15 12  ALT 16 14  ALKPHOS 100 110  BILITOT 0.5 0.2*  PROT 7.3 5.8*  ALBUMIN 2.8* 2.0*    Recent Labs Lab 07/25/13 1012  LIPASE 24    Recent Labs Lab 07/29/13 1828  AMMONIA 22   CBC:  Recent Labs Lab 07/25/13 1012 07/27/13 0606 07/28/13 0546 07/29/13 0534 07/30/13 0513  WBC 9.0 9.8 8.7 10.1 12.6*  NEUTROABS 8.1*  --   --   --   --   HGB 11.1* 9.3* 9.7* 9.7* 10.6*  HCT 33.8* 29.1* 29.9* 29.8* 33.0*  MCV 86.4 86.1 86.2 86.6 86.8  PLT 201 185 226 262 408*   Cardiac Enzymes: No results found for this basename: CKTOTAL, CKMB, CKMBINDEX, TROPONINI,  in the last 168 hours BNP (last 3 results) No results found for this basename: PROBNP,  in the last 8760 hours CBG:  Recent Labs Lab 07/29/13 0726 07/29/13 1100 07/29/13 1651 07/29/13 2101 07/30/13 0733  GLUCAP 128* 185* 183* 155* 136*    Recent Results (from the past 240 hour(s))  URINE CULTURE     Status: None   Collection Time    07/25/13 11:47 AM      Result Value Ref Range Status   Specimen Description URINE, CLEAN CATCH   Final   Special Requests NONE   Final   Culture  Setup Time     Final   Value: 07/26/2013 13:21     Performed at [place]     Final   Value: >=100,000 COLONIES/ML     Performed at [facility]   Culture     Final   Value: ESCHERICHIA COLI     Performed at [facility]   Report Status 07/28/2013 FINAL   Final   Organism ID, Bacteria ESCHERICHIA COLI   Final     Studies: No results  found.  Scheduled Meds: . amLODipine  10 mg Oral Daily  . aspirin EC  81 mg Oral Daily  . atenolol  25 mg Oral Daily  . cefTRIAXone (ROCEPHIN)  IV  1 g Intravenous Q24H  . cloNIDine  0.3 mg Transdermal Weekly  . clopidogrel  75 mg Oral Q breakfast  . diltiazem  360 mg Oral Daily  . DULoxetine  40 mg Oral Daily  . enoxaparin (LOVENOX) injection  40 mg Subcutaneous Q24H  . famotidine  20 mg Oral BID  . gabapentin  300 mg Oral QHS  . hydrALAZINE  50 mg Oral QID  . insulin aspart  0-15 Units Subcutaneous TID WC  . insulin aspart  0-5 Units Subcutaneous QHS  . potassium chloride  40 mEq Oral Once  . potassium chloride  40 mEq Oral Q4H  . simvastatin  10 mg Oral  q1800   Continuous Infusions:   Principal Problem:   Pyelonephritis Active Problems:   HTN (hypertension)   Obesity   Mixed hyperlipidemia   Paresthesias in left hand   Acute on chronic renal failure   Hyperglycemia   Anemia   Hyponatremia   Subluxation of left shoulder joint   CKD (chronic kidney disease) stage 2, GFR 60-89 ml/min   Leukocytosis, unspecified    Time spent: 35 minutes    [facility] Pager [phone]. If 7PM-7AM, please contact night-coverage at [email or web address], password [facility] 07/30/2013, 10:19 AM  LOS: 5 days

## 2013-07-31 LAB — CBC
HEMATOCRIT: 30.9 % — AB (ref 36.0–46.0)
Hemoglobin: 9.9 g/dL — ABNORMAL LOW (ref 12.0–15.0)
MCH: 28 pg (ref 26.0–34.0)
MCHC: 32 g/dL (ref 30.0–36.0)
MCV: 87.3 fL (ref 78.0–100.0)
Platelets: 451 10*3/uL — ABNORMAL HIGH (ref 150–400)
RBC: 3.54 MIL/uL — AB (ref 3.87–5.11)
RDW: 14.7 % (ref 11.5–15.5)
WBC: 12.4 10*3/uL — AB (ref 4.0–10.5)

## 2013-07-31 LAB — GLUCOSE, CAPILLARY
GLUCOSE-CAPILLARY: 136 mg/dL — AB (ref 70–99)
Glucose-Capillary: 131 mg/dL — ABNORMAL HIGH (ref 70–99)

## 2013-07-31 LAB — BASIC METABOLIC PANEL
BUN: 15 mg/dL (ref 6–23)
CHLORIDE: 101 meq/L (ref 96–112)
CO2: 26 meq/L (ref 19–32)
CREATININE: 1.25 mg/dL — AB (ref 0.50–1.10)
Calcium: 8.6 mg/dL (ref 8.4–10.5)
GFR calc Af Amer: 52 mL/min — ABNORMAL LOW (ref >90)
GFR calc non Af Amer: 45 mL/min — ABNORMAL LOW (ref >90)
Glucose, Bld: 141 mg/dL — ABNORMAL HIGH (ref 70–99)
Potassium: 4.4 mEq/L (ref 3.7–5.3)
Sodium: 139 mEq/L (ref 137–147)

## 2013-07-31 LAB — MAGNESIUM: Magnesium: 1.9 mg/dL (ref 1.5–2.5)

## 2013-07-31 MED ORDER — FAMOTIDINE 20 MG PO TABS: 20.0000 mg | ORAL_TABLET | Freq: Two times a day (BID) | ORAL | Status: DC

## 2013-07-31 MED ORDER — CIPROFLOXACIN HCL 250 MG PO TABS
250.0000 mg | ORAL_TABLET | Freq: Two times a day (BID) | ORAL | Status: DC
  Administered 2013-07-31: 250 mg via ORAL
  Filled 2013-07-31: qty 1

## 2013-07-31 MED ORDER — CIPROFLOXACIN HCL 250 MG PO TABS: 500.0000 mg | ORAL_TABLET | Freq: Two times a day (BID) | ORAL | Status: DC

## 2013-07-31 MED ORDER — CIPROFLOXACIN HCL 500 MG PO TABS: 500.0000 mg | ORAL_TABLET | Freq: Two times a day (BID) | ORAL | Status: DC

## 2013-07-31 NOTE — Progress Notes (Signed)
UR chart review completed.  

## 2013-07-31 NOTE — Progress Notes (Signed)
[facility] PROGRESS NOTE  Kelli [name] [id number] DOB: 1951/02/27 DOA: 07/25/2013 PCP: Manon Hilding, MD  Assessment/Plan: 1. UTI/acute pyelonephritis. Her urine culture is growing out Escherichia coli. We'll continue Rocephin day #7. Will transition to cipro.   Nausea. . Abdominal x-ray ordered and revealed nonobstructive bowel gas pattern. Her lipase and LFTs were within normal limits on admission. No further vomiting.   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 Her baseline creatinine is 1.2-1.4. Her renal function is at baseline.   Hyponatremia, secondary to hypovolemia. Resolved with IV fluids.   Hypertension. Fair control. We'll continue amlodipine, atenolol, diltiazem, and hydralazine.   Transient chest pain. Her chest x-ray revealed normal heart and lungs. Her EKG revealed normal sinus rhythm with no ST or T wave abnormalities. The patient attributed her symptomatology to "gas. Simethicone was started when necessary. No further episodes of chest pain   Hyperglycemia/query prediabetes. She reports "borderline diabetes. Her hemoglobin A1c is 6.0. CBG range 109-136. Will continue sliding scale NovoLog.   History of stroke. Continue aspirin and Plavix.   Anemia. Her anemia is likely chronic, but hemoglobin has decreased because of the dilutional effects of IV fluids. No evidence of gross GI bleeding, but because of chronic antiplatelet therapy, Pepcid was added. Hg stable. Anemia panel with Iron 20 otherwise unremarkable. Consider supplementation   Subluxation left shoulder. Orthopedic surgery was consulted when the patient's chest x-ray revealed a possible dislocation. Dr. Aline Brochure ordered a dedicated left shoulder x-ray. It revealed subluxation but no dislocation. He recommended left upper extremity functional positional splint and for her to continue outpatient occupational and physical therapy.   Confusion. Narcotics discontinued on  5/30. She continues to improve. Less irritable today. May be residual/slow to improve effects of UTI. Ammonia level within the limits of normal. She is oriented to time and person. Will continue to monitor.   Discontinue contact precautions as the patient has no diarrhea.   Leukocytosis: may be reactive. Stable at 12.  She is afebrile and non-toxic appearing. Chest xray without effusion or infiltrate. Continue to encourage incentive spirometry and get patient OOB. Monitor   Hypokalemia: resolved. Magnesium level  1.9.   Thrombocytosis: may be reactive. Trending upward.  Chart review indicates recent hx of same during hospitalization for stroke. Will check anemia panel. Monitor. If no improvement consider peripheral smear.    Code Status: full Family Communication: none present Disposition Plan: home hopefully tomorrw   Consultants:  Dr Aline Brochure ortho  Procedures:  none  Antibiotics:  Ceftriaxone 07/25/13-07/31/13  cipro 07/31/13  HPI/Subjective: Awake alert. Denies pain/nause. Reports wanting to eat breakfast.   Objective: Filed Vitals:   07/31/13 0713  BP: 187/72  Pulse: 81  Temp: 98.9 F (37.2 C)  Resp: 18    Intake/Output Summary (Last 24 hours) at 07/31/13 1001 Last data filed at 07/31/13 0900  Gross per 24 hour  Intake    360 ml  Output    300 ml  Net     60 ml   Filed Weights   07/29/13 1400 07/30/13 0606 07/31/13 0713  Weight: 97.4 kg (214 lb 11.7 oz) 95.5 kg (210 lb 8.6 oz) 92.9 kg (204 lb 12.9 oz)    Exam:   General:  Appears comfortable, cooperative  Cardiovascular: S1 and S2. +murmur no gallup no rub no LE edema  Respiratory: normal effort BS clear to auscultation no wheeze  Abdomen: obes non-distended +BS non-tender to palpation  Musculoskeletal: sling to left arm intact.  Left shoulder tender to touch.    Data Reviewed: Basic Metabolic Panel:  Recent Labs Lab 07/27/13 0606 07/28/13 0546 07/29/13 0534 07/30/13 0507 07/30/13 0513  07/31/13 0544  NA 137 136* 136*  --  140 139  K 3.7 3.3* 3.2*  --  3.2* 4.4  CL 100 99 100  --  101 101  CO2 22 21 22   --  25 26  GLUCOSE 164* 123* 129*  --  134* 141*  BUN 32* 23 22  --  16 15  CREATININE 1.48* 1.18* 1.27*  --  1.20* 1.25*  CALCIUM 8.3* 8.3* 8.2*  --  8.6 8.6  MG  --   --   --  1.9  --  1.9   Liver Function Tests:  Recent Labs Lab 07/25/13 1012 07/28/13 0546  AST 15 12  ALT 16 14  ALKPHOS 100 110  BILITOT 0.5 0.2*  PROT 7.3 5.8*  ALBUMIN 2.8* 2.0*    Recent Labs Lab 07/25/13 1012  LIPASE 24    Recent Labs Lab 07/29/13 1828  AMMONIA 22   CBC:  Recent Labs Lab 07/25/13 1012 07/27/13 0606 07/28/13 0546 07/29/13 0534 07/30/13 0513 07/31/13 0544  WBC 9.0 9.8 8.7 10.1 12.6* 12.4*  NEUTROABS 8.1*  --   --   --   --   --   HGB 11.1* 9.3* 9.7* 9.7* 10.6* 9.9*  HCT 33.8* 29.1* 29.9* 29.8* 33.0* 30.9*  MCV 86.4 86.1 86.2 86.6 86.8 87.3  PLT 201 185 226 262 408* 451*   Cardiac Enzymes: No results found for this basename: CKTOTAL, CKMB, CKMBINDEX, TROPONINI,  in the last 168 hours BNP (last 3 results) No results found for this basename: PROBNP,  in the last 8760 hours CBG:  Recent Labs Lab 07/29/13 2101 07/30/13 0733 07/30/13 1136 07/30/13 2057 07/31/13 0752  GLUCAP 155* 136* 121* 109* 136*    Recent Results (from the past 240 hour(s))  URINE CULTURE     Status: None   Collection Time    07/25/13 11:47 AM      Result Value Ref Range Status   Specimen Description URINE, CLEAN CATCH   Final   Special Requests NONE   Final   Culture  Setup Time     Final   Value: 07/26/2013 13:21     Performed at [facility]     Final   Value: >=100,000 COLONIES/ML     Performed at [facility]   Culture     Final   Value: ESCHERICHIA COLI     Performed at [facility]   Report Status 07/28/2013 FINAL   Final   Organism ID, Bacteria ESCHERICHIA COLI   Final     Studies: Dg Chest Port 1 View  07/30/2013    CLINICAL DATA:  Lethargy and leukocytosis.  EXAM: PORTABLE CHEST - 1 VIEW  COMPARISON:  07/26/2013  FINDINGS: Telemetry device projects over the left heart border. Mild apical lordotic positioning. Patient rotated minimally right. Borderline cardiomegaly. No pleural effusion or pneumothorax. Clear lungs. Improved appearance of left glenohumeral joint grossly within normal limits.  IMPRESSION: Borderline cardiomegaly, without acute.   Electronically Signed   By: Abigail Miyamoto M.D.   On: 07/30/2013 13:38    Scheduled Meds: . amLODipine  10 mg Oral Daily  . aspirin EC  81 mg Oral Daily  . atenolol  25 mg Oral Daily  . cefTRIAXone (ROCEPHIN)  IV  1 g Intravenous Q24H  .  cloNIDine  0.3 mg Transdermal Weekly  . clopidogrel  75 mg Oral Q breakfast  . diltiazem  360 mg Oral Daily  . DULoxetine  40 mg Oral Daily  . enoxaparin (LOVENOX) injection  40 mg Subcutaneous Q24H  . famotidine  20 mg Oral BID  . gabapentin  300 mg Oral QHS  . hydrALAZINE  50 mg Oral QID  . insulin aspart  0-15 Units Subcutaneous TID WC  . insulin aspart  0-5 Units Subcutaneous QHS  . simvastatin  10 mg Oral q1800   Continuous Infusions:   Principal Problem:   Pyelonephritis Active Problems:   HTN (hypertension)   Obesity   Mixed hyperlipidemia   Paresthesias in left hand   Acute on chronic renal failure   Hyperglycemia   Anemia   Hyponatremia   Subluxation of left shoulder joint   CKD (chronic kidney disease) stage 2, GFR 60-89 ml/min   Leukocytosis, unspecified   Thrombocytosis    Time spent: [facility] Pager [phone]. If 7PM-7AM, please contact night-coverage at [email or web address], password [email or web address] 07/31/2013, 10:01 AM  LOS: 6 days

## 2013-07-31 NOTE — Progress Notes (Signed)
The patient was seen and examined. She was discussed with nurse practitioner, Ms. Renard Hamper. Agree with her assessment and plan.

## 2013-07-31 NOTE — Discharge Summary (Signed)
Physician Discharge Summary  Kelli [name] [id number] DOB: February 22, 1951 DOA: 07/25/2013  PCP: Manon Hilding, MD  Admit date: 07/25/2013 Discharge date: 07/31/2013  Time spent: 40 minutes  Recommendations for Outpatient Follow-up:  1. PCP in one week to evaluate resolution of UTI. Recommend CBC and BMET to evaluate kidney function, electrolytes,Hg, platelets and WBC. Recommend evaluation of BP for optimal control.  2. HH PT for strength and endurance.  Discharge Diagnoses:  Principal Problem:   Pyelonephritis Active Problems:   HTN (hypertension)   Obesity   Mixed hyperlipidemia   Paresthesias in left hand   Acute on chronic renal failure   Hyperglycemia   Anemia   Hyponatremia   Subluxation of left shoulder joint   CKD (chronic kidney disease) stage 2, GFR 60-89 ml/min   Leukocytosis, unspecified   Thrombocytosis   Discharge Condition: stable  Diet recommendation: soft  Filed Weights   07/29/13 1400 07/30/13 0606 07/31/13 0713  Weight: 97.4 kg (214 lb 11.7 oz) 95.5 kg (210 lb 8.6 oz) 92.9 kg (204 lb 12.9 oz)    History of present illness:  Kelli [name] is a 63 y.o. female with a past medical history that includes stroke with moderate left sided weakness deficit, chronic renal failure stage III, hypertension, presented to the emergency department on 07/25/13 with the chief complaint of a 3 day history of abdominal pain nausea vomiting. Initial workup in the emergency department consistent with pyelonephritis, acute renal failure.  Information obtained from the patient and the husband at the bedside. He indicated that 3 days prior she developed abdominal pain and gradual nausea. He said that at that time she dry heaved. Her by mouth intake was less than usual taking only intermittent fluids. Associated symptoms included subjective fever chills generalized weakness decreased sensorium and one episode of diarrhea. Patient denied any dysuria hematuria frequency or urgency. She  complained of left-sided low back pain and the husband confirmed this is chronic. She denied chest pain palpitations shortness of breath headache visual disturbances.  Evaluation in the emergency department revealed urinalysis consistent with UTI, metabolic panel with a serum creatinine of 1.93 sodium 134 BUN 36. Complete blood count revealed a hemoglobin of 11.1, neutrophils 90%. Chest x-ray with no acute findings. She was hemodynamically stable with a temperature of 100.1 orally she was not hypoxic.  In the emergency department she was given normal saline intravenously and Rocephin.   Hospital Course:  1. UTI/acute pyelonephritis. Her urine culture is growing out Escherichia coli. She received 7 days of rocephin. Will discharge with 3 more days of cipro to complete 10 day course.    Nausea.Abdominal x-ray revealed nonobstructive bowel gas pattern. Her lipase and LFTs were within normal limits on admission. Improved with supportive therapy. At discharge tolerating regular diet with no abdominal pain/nausea.    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 Her baseline creatinine is 1.2-1.4. Her renal function remained at baseline.   Hyponatremia, secondary to hypovolemia. Resolved with IV fluids.   Hypertension. Only fair control. Home medication include amlodipine, atenolol, diltiazem, and hydralazine. These were continued during hospitalized. SBP range 142-180. Recommend close follow up for optimal control  Transient chest pain. Her chest x-ray revealed normal heart and lungs. Her EKG revealed normal sinus rhythm with no ST or T wave abnormalities. The patient attributed her symptomatology to "gas. Simethicone was started when necessary. No further episodes of chest pain   Hyperglycemia/query prediabetes. She reports "borderline diabetes. Her hemoglobin A1c is  6.0. CBG range 131-136.   History of stroke. Continue aspirin and Plavix.   Anemia. Her anemia is  likely chronic, but hemoglobin has decreased because of the dilutional effects of IV fluids. No evidence of gross GI bleeding, but because of chronic antiplatelet therapy, Pepcid was added. Stable at discharge.   Subluxation left shoulder. Orthopedic surgery was consulted when the patient's chest x-ray revealed a possible dislocation. Dr. Aline Brochure ordered a dedicated left shoulder x-ray. It revealed subluxation but no dislocation. He recommended left upper extremity functional positional splint and for her to continue outpatient occupational and physical therapy. Have requested [place] [facility].   Confusion. Narcotics discontinued on 5/30. May be residual/slow to improve effects of UTI. Ammonia level within the limits of normal. At discharge she is at baseline and is oriented to time and person and place.   Discontinue contact precautions as the patient has no diarrhea.   Leukocytosis: Mild. May be reactive. At discharge she is  afebrile and non-toxic appearing. Chest xray without acute cardiopulmonary process. She has received 7 days of rocephin and will be discharged with 4 more days of cipro. At discharge clinically improved. Recommend CBC 1 week for tracking of WBC  Hypokalemia: related to decreased po intake. Repleted and resolved at discharge. magnesium level 1.9.   Thrombocytosis: may be reactive. Chart review indicates recent hx of same during hospitalization for stroke. Anemia panel with Iron 20, TIBC 233. Recommend CBC 1 week for evaluation of platelets.   Procedures:  none  Consultations:  Dr Aline Brochure ortho  Discharge Exam: Filed Vitals:   07/31/13 1456  BP: 147/59  Pulse: 65  Temp: 98 F (36.7 C)  Resp: 20    General: well nourished appears comfortable. Calm and cooperative Cardiovascular: S1 and S2.  RRR +murmur no gallup or rub.  No LE edema Respiratory: normal effort BS clear bilaterally. No wheeze or rhonchi Neuro: alert oriented. Right arm and leg strength 5/5. Left side  strength 3/5.   Discharge Instructions You were cared for by a hospitalist during your hospital stay. If you have any questions about your discharge medications or the care you received while you were in the hospital after you are discharged, you can call the unit and asked to speak with the hospitalist on call if the hospitalist that took care of you is not available. Once you are discharged, your primary care physician will handle any further medical issues. Please note that NO REFILLS for any discharge medications will be authorized once you are discharged, as it is imperative that you return to your primary care physician (or establish a relationship with a primary care physician if you do not have one) for your aftercare needs so that they can reassess your need for medications and monitor your lab values.      Discharge Instructions   Diet - low sodium heart healthy    Complete by:  As directed      Discharge instructions    Complete by:  As directed   Take medication as directed Follow up with PCP in 1 week     Increase activity slowly    Complete by:  As directed             Medication List         acetaminophen 325 MG tablet  Commonly known as:  TYLENOL  Take 1-2 tablets (325-650 mg total) by mouth every 4 (four) hours as needed for mild pain.     amLODipine 10 MG tablet  Commonly known as:  NORVASC  Take 10 mg by mouth daily.     aspirin EC 81 MG tablet  Take 81 mg by mouth daily.     atenolol 25 MG tablet  Commonly known as:  TENORMIN  Take 25 mg by mouth daily.     ciprofloxacin 500 MG tablet  Commonly known as:  CIPRO  Take 1 tablet (500 mg total) by mouth 2 (two) times daily.     clonazePAM 0.5 MG tablet  Commonly known as:  KLONOPIN  Take 1/2 tablet to (2) half tablets by mouth twice daily as needed for anxiety. Note half tablets     cloNIDine 0.3 mg/24hr patch  Commonly known as:  CATAPRES - Dosed in mg/24 hr  Place 0.3 mg onto the skin once a week. On  Tuesday     clopidogrel 75 MG tablet  Commonly known as:  PLAVIX  Take 1 tablet (75 mg total) by mouth daily with breakfast.     diltiazem 360 MG 24 hr capsule  Commonly known as:  CARDIZEM CD  Take 360 mg by mouth daily.     diphenoxylate-atropine 2.5-0.025 MG per tablet  Commonly known as:  LOMOTIL  Take 1 tablet by mouth 4 (four) times daily as needed for diarrhea or loose stools.     DULoxetine 20 MG capsule  Commonly known as:  CYMBALTA  Take 40 mg by mouth daily.     famotidine 20 MG tablet  Commonly known as:  PEPCID  Take 1 tablet (20 mg total) by mouth 2 (two) times daily.     FLORAJEN ACIDOPHILUS Caps  Take 1 capsule by mouth daily.     gabapentin 300 MG capsule  Commonly known as:  NEURONTIN  Take 1 capsule (300 mg total) by mouth at bedtime.     hydrALAZINE 50 MG tablet  Commonly known as:  APRESOLINE  Take 1 tablet (50 mg total) by mouth 4 (four) times daily.     HYDROcodone-acetaminophen 5-325 MG per tablet  Commonly known as:  NORCO/VICODIN  Take one to two tablets by mouth every 4 hours as needed for moderate pain     polyethylene glycol powder powder  Commonly known as:  GLYCOLAX/MIRALAX  Take 17 g by mouth daily as needed for mild constipation.     simvastatin 10 MG tablet  Commonly known as:  ZOCOR  Take 1 tablet (10 mg total) by mouth daily at 6 PM.     traZODone 50 MG tablet  Commonly known as:  DESYREL  Take 50 mg by mouth at bedtime as needed for sleep.       Allergies  Allergen Reactions  . Hydrochlorothiazide Nausea And Vomiting   Follow-up Information   Follow up with [facility].   Contact information:   [address] [place] [address] [phone]       Follow up with Manon Hilding, MD. Schedule an appointment as soon as possible for a visit on 08/10/2013. (for evaluation of symptoms. recommend evaluation of UTI to ensure resolution. recommend BMET and CBC to evalutate electrolytes, kidney function  and Hg. Has appointment at 2:30pm  )    Specialty:  Cardiology   Contact information:   [place] [address] [phone]        The results of significant diagnostics from this hospitalization (including imaging, microbiology, ancillary and laboratory) are listed below for reference.    Significant Diagnostic Studies: Dg Chest [facility] 1 [address]  07/30/2013   CLINICAL DATA:  Lethargy and leukocytosis.  EXAM: PORTABLE CHEST - 1 VIEW  COMPARISON:  07/26/2013  FINDINGS: Telemetry device projects over the left heart border. Mild apical lordotic positioning. Patient rotated minimally right. Borderline cardiomegaly. No pleural effusion or pneumothorax. Clear lungs. Improved appearance of left glenohumeral joint grossly within normal limits.  IMPRESSION: Borderline cardiomegaly, without acute.   Electronically Signed   By: Abigail Miyamoto M.D.   On: 07/30/2013 13:38   Dg Chest Port 1 View  07/26/2013   CLINICAL DATA:  Chest tightness.  EXAM: PORTABLE CHEST - 1 VIEW  COMPARISON:  07/25/2013 and 05/12/2013  FINDINGS: The heart size and mediastinal contours are within normal limits. Both lungs are clear.  There is marked subluxation or dislocation of the proximal left humerus which is new since 04/17/2013.  IMPRESSION: Heart and lungs appear normal. Marked subluxation or dislocation of the proximal left humerus. This is new since 04/17/2013.   Electronically Signed   By: Rozetta Nunnery M.D.   On: 07/26/2013 14:53   Dg Chest Portable 1 View  07/25/2013   CLINICAL DATA:  Vomiting.  EXAM: PORTABLE CHEST - 1 VIEW  COMPARISON:  05/12/2013  FINDINGS: Heart is mildly enlarged. No confluent airspace opacities or effusions. No acute bony abnormality.  IMPRESSION: No acute findings.   Electronically Signed   By: Rolm Baptise M.D.   On: 07/25/2013 10:28   Dg Shoulder Left  07/26/2013   CLINICAL DATA:  Pain.  EXAM: LEFT SHOULDER - 2+ VIEW  COMPARISON:  None.  FINDINGS: Degenerative changes about the left shoulder.  Subluxation may be present. There is no evidence of fracture or dislocation. No evidence of separation.  IMPRESSION: Degenerative change. There is no evidence of fracture, dislocation, or separation.   Electronically Signed   By: [place]   On: 07/26/2013 20:40   Dg Abd Portable 1v  07/27/2013   CLINICAL DATA:  Nausea. Can do portable.  EXAM: PORTABLE ABDOMEN - 1 VIEW  COMPARISON:  None.  FINDINGS: No disproportionate dilatation of bowel. No obvious free intraperitoneal gas.  IMPRESSION: Nonobstructive bowel gas pattern.   Electronically Signed   By: Maryclare Bean M.D.   On: 07/27/2013 15:32    Microbiology: Recent Results (from the past 240 hour(s))  URINE CULTURE     Status: None   Collection Time    07/25/13 11:47 AM      Result Value Ref Range Status   Specimen Description URINE, CLEAN CATCH   Final   Special Requests NONE   Final   Culture  Setup Time     Final   Value: 07/26/2013 13:21     Performed at [facility]     Final   Value: >=100,000 COLONIES/ML     Performed at [facility]   Culture     Final   Value: ESCHERICHIA COLI     Performed at [facility]   Report Status 07/28/2013 FINAL   Final   Organism ID, Bacteria ESCHERICHIA COLI   Final     Labs: Basic Metabolic Panel:  Recent Labs Lab 07/27/13 0606 07/28/13 0546 07/29/13 0534 07/30/13 0507 07/30/13 0513 07/31/13 0544  NA 137 136* 136*  --  140 139  K 3.7 3.3* 3.2*  --  3.2* 4.4  CL 100 99 100  --  101 101  CO2 22 21 22   --  25 26  GLUCOSE 164* 123* 129*  --  134*  141*  BUN 32* 23 22  --  16 15  CREATININE 1.48* 1.18* 1.27*  --  1.20* 1.25*  CALCIUM 8.3* 8.3* 8.2*  --  8.6 8.6  MG  --   --   --  1.9  --  1.9   Liver Function Tests:  Recent Labs Lab 07/25/13 1012 07/28/13 0546  AST 15 12  ALT 16 14  ALKPHOS 100 110  BILITOT 0.5 0.2*  PROT 7.3 5.8*  ALBUMIN 2.8* 2.0*    Recent Labs Lab 07/25/13 1012  LIPASE 24    Recent Labs Lab  07/29/13 1828  AMMONIA 22   CBC:  Recent Labs Lab 07/25/13 1012 07/27/13 0606 07/28/13 0546 07/29/13 0534 07/30/13 0513 07/31/13 0544  WBC 9.0 9.8 8.7 10.1 12.6* 12.4*  NEUTROABS 8.1*  --   --   --   --   --   HGB 11.1* 9.3* 9.7* 9.7* 10.6* 9.9*  HCT 33.8* 29.1* 29.9* 29.8* 33.0* 30.9*  MCV 86.4 86.1 86.2 86.6 86.8 87.3  PLT 201 185 226 262 408* 451*   Cardiac Enzymes: No results found for this basename: CKTOTAL, CKMB, CKMBINDEX, TROPONINI,  in the last 168 hours BNP: BNP (last 3 results) No results found for this basename: PROBNP,  in the last 8760 hours CBG:  Recent Labs Lab 07/30/13 0733 07/30/13 1136 07/30/13 2057 07/31/13 0752 07/31/13 1151  GLUCAP 136* 121* 109* 136* 131*       Signed:  Lezlie Octave Anjannette Gauger  [facility] 07/31/2013, 3:04 PM

## 2013-07-31 NOTE — Discharge Summary (Signed)
The patient was seen and examined. Her chart, vital signs, and laboratory studies were reviewed. She was discussed with nurse practitioner, Ms. Renard Hamper. Agree with her assessment and plan. The patient is clearly symptomatically improved and without complaints (with the exception of hospital food) at the time of discharge.

## 2013-07-31 NOTE — Progress Notes (Signed)
Physical Therapy Treatment Patient Details Name: Kelli [name] MRN: [id number] DOB: 1950-06-04 Today's Date: 07/31/2013          PT Comments Patient requested bedpan use upon arrival and had soiled sheets and gown, called for tech which never arrived. Therapist obtain bedpan, clean gown and completed necessary therapeutic/self-care activities to prepare patient for transfer to recliner. Patient required min/mod A for rolling left to place/remove bed pan and mod A to get supine<>EOB. Although nursing had been using lift to transfer patient, PT had initially not. Today patient was able to to perform standing pivot transfer with Mod A bed<>recliner without device with therapist. At this  Point MD entered room and stated pt would be d/c today.              Precautions / Restrictions Restrictions Weight Bearing Restrictions: No    Mobility  Bed Mobility   Bed Mobility: Rolling;Supine to Sit Rolling: Min assist;Mod assist   Supine to sit: Mod assist     General bed mobility comments: use of rails for rolling  Transfers Overall transfer level: Needs assistance Equipment used: None Transfers: Stand Pivot Transfers;Sit to/from Stand Sit to Stand: Min assist Stand pivot transfers: Min assist       General transfer comment: Patient unable to use RW due to nonfunctional RUE                                                                                             Exercises Other Exercises Other Exercises: dynamic and static sitting balance activities EOB; lob x2 which required therapist recovery                         PT Goals (current goals can now be found in the care plan section) Progress towards PT goals: Progressing toward goals     End of Session Equipment Utilized During Treatment: Gait belt Activity Tolerance: Patient tolerated treatment well Patient left: in chair;with chair alarm set;with call bell/phone within  reach     Time: 0952-1035 PT Time Calculation (min): 43 min  Charges:  $Therapeutic Activity: 23-37 mins                    G Codes:      Molli Knock 07/31/2013, 10:44 AM

## 2013-07-31 NOTE — Progress Notes (Signed)
[name] husband states understanding of discharge instructions.

## 2013-07-31 NOTE — Progress Notes (Signed)
Patient loss IV access and is due to be discharged tomorrow. Paged on-call physician to notify him of IV access being loss and get permission to leave it out since she is a possible discharge.

## 2013-08-01 LAB — GLUCOSE, CAPILLARY: GLUCOSE-CAPILLARY: 183 mg/dL — AB (ref 70–99)

## 2013-08-03 ENCOUNTER — Ambulatory Visit: Payer: 59 | Admitting: Physical Medicine & Rehabilitation

## 2013-08-14 ENCOUNTER — Other Ambulatory Visit (HOSPITAL_COMMUNITY): Payer: Self-pay | Admitting: Internal Medicine

## 2013-08-20 ENCOUNTER — Ambulatory Visit (INDEPENDENT_AMBULATORY_CARE_PROVIDER_SITE_OTHER): Payer: 59 | Admitting: *Deleted

## 2013-08-20 DIAGNOSIS — I639 Cerebral infarction, unspecified: Secondary | ICD-10-CM

## 2013-08-20 DIAGNOSIS — I6350 Cerebral infarction due to unspecified occlusion or stenosis of unspecified cerebral artery: Secondary | ICD-10-CM

## 2013-08-20 LAB — MDC_IDC_ENUM_SESS_TYPE_REMOTE

## 2013-08-24 NOTE — Progress Notes (Signed)
Loop recorder 

## 2013-08-27 ENCOUNTER — Other Ambulatory Visit: Payer: Self-pay | Admitting: Internal Medicine

## 2013-09-03 ENCOUNTER — Other Ambulatory Visit: Payer: Self-pay | Admitting: *Deleted

## 2013-09-03 MED ORDER — TRAZODONE HCL 50 MG PO TABS: 50.0000 mg | ORAL_TABLET | Freq: Every evening | ORAL | Status: DC | PRN

## 2013-09-07 ENCOUNTER — Encounter: Payer: Self-pay | Admitting: Cardiology

## 2013-09-07 ENCOUNTER — Encounter: Payer: 59 | Admitting: Cardiology

## 2013-09-07 NOTE — Progress Notes (Signed)
No show  This encounter was created in error - please disregard.

## 2013-09-13 ENCOUNTER — Encounter: Payer: Self-pay | Admitting: Internal Medicine

## 2013-09-17 ENCOUNTER — Emergency Department (HOSPITAL_COMMUNITY)
Admission: EM | Admit: 2013-09-17 | Discharge: 2013-09-17 | Disposition: A | Discharge status: Home / Self Care | Payer: 59 | Attending: Emergency Medicine | Admitting: Emergency Medicine

## 2013-09-17 ENCOUNTER — Encounter (HOSPITAL_COMMUNITY): Payer: Self-pay | Admitting: Emergency Medicine

## 2013-09-17 ENCOUNTER — Emergency Department (HOSPITAL_COMMUNITY): Payer: 59

## 2013-09-17 DIAGNOSIS — N183 Chronic kidney disease, stage 3 unspecified: Secondary | ICD-10-CM | POA: Insufficient documentation

## 2013-09-17 DIAGNOSIS — Z87828 Personal history of other (healed) physical injury and trauma: Secondary | ICD-10-CM | POA: Insufficient documentation

## 2013-09-17 DIAGNOSIS — G8190 Hemiplegia, unspecified affecting unspecified side: Secondary | ICD-10-CM | POA: Insufficient documentation

## 2013-09-17 DIAGNOSIS — R200 Anesthesia of skin: Secondary | ICD-10-CM

## 2013-09-17 DIAGNOSIS — Z7902 Long term (current) use of antithrombotics/antiplatelets: Secondary | ICD-10-CM | POA: Insufficient documentation

## 2013-09-17 DIAGNOSIS — E782 Mixed hyperlipidemia: Secondary | ICD-10-CM | POA: Insufficient documentation

## 2013-09-17 DIAGNOSIS — R209 Unspecified disturbances of skin sensation: Secondary | ICD-10-CM | POA: Insufficient documentation

## 2013-09-17 DIAGNOSIS — N390 Urinary tract infection, site not specified: Secondary | ICD-10-CM | POA: Insufficient documentation

## 2013-09-17 DIAGNOSIS — Z79899 Other long term (current) drug therapy: Secondary | ICD-10-CM | POA: Insufficient documentation

## 2013-09-17 DIAGNOSIS — Z792 Long term (current) use of antibiotics: Secondary | ICD-10-CM | POA: Insufficient documentation

## 2013-09-17 DIAGNOSIS — Z7982 Long term (current) use of aspirin: Secondary | ICD-10-CM | POA: Insufficient documentation

## 2013-09-17 DIAGNOSIS — Z8673 Personal history of transient ischemic attack (TIA), and cerebral infarction without residual deficits: Secondary | ICD-10-CM | POA: Insufficient documentation

## 2013-09-17 DIAGNOSIS — R4789 Other speech disturbances: Secondary | ICD-10-CM | POA: Insufficient documentation

## 2013-09-17 DIAGNOSIS — I129 Hypertensive chronic kidney disease with stage 1 through stage 4 chronic kidney disease, or unspecified chronic kidney disease: Secondary | ICD-10-CM | POA: Insufficient documentation

## 2013-09-17 DIAGNOSIS — R29810 Facial weakness: Secondary | ICD-10-CM | POA: Insufficient documentation

## 2013-09-17 DIAGNOSIS — Z87891 Personal history of nicotine dependence: Secondary | ICD-10-CM | POA: Insufficient documentation

## 2013-09-17 DIAGNOSIS — R51 Headache: Secondary | ICD-10-CM | POA: Insufficient documentation

## 2013-09-17 LAB — BASIC METABOLIC PANEL
Anion gap: 13 (ref 5–15)
BUN: 16 mg/dL (ref 6–23)
CO2: 25 mEq/L (ref 19–32)
Calcium: 9 mg/dL (ref 8.4–10.5)
Chloride: 98 mEq/L (ref 96–112)
Creatinine, Ser: 1.54 mg/dL — ABNORMAL HIGH (ref 0.50–1.10)
GFR calc Af Amer: 41 mL/min — ABNORMAL LOW (ref >90)
GFR, EST NON AFRICAN AMERICAN: 35 mL/min — AB (ref >90)
GLUCOSE: 123 mg/dL — AB (ref 70–99)
POTASSIUM: 3.6 meq/L — AB (ref 3.7–5.3)
Sodium: 136 mEq/L — ABNORMAL LOW (ref 137–147)

## 2013-09-17 LAB — URINALYSIS, ROUTINE W REFLEX MICROSCOPIC
Bilirubin Urine: NEGATIVE
GLUCOSE, UA: NEGATIVE mg/dL
HGB URINE DIPSTICK: NEGATIVE
Ketones, ur: NEGATIVE mg/dL
Nitrite: NEGATIVE
PROTEIN: 100 mg/dL — AB
SPECIFIC GRAVITY, URINE: 1.025 (ref 1.005–1.030)
Urobilinogen, UA: 0.2 mg/dL (ref 0.0–1.0)
pH: 6 (ref 5.0–8.0)

## 2013-09-17 LAB — CBC WITH DIFFERENTIAL/PLATELET
Basophils Absolute: 0 10*3/uL (ref 0.0–0.1)
Basophils Relative: 0 % (ref 0–1)
EOS ABS: 0.2 10*3/uL (ref 0.0–0.7)
Eosinophils Relative: 2 % (ref 0–5)
HEMATOCRIT: 32.3 % — AB (ref 36.0–46.0)
HEMOGLOBIN: 10.5 g/dL — AB (ref 12.0–15.0)
LYMPHS ABS: 2 10*3/uL (ref 0.7–4.0)
Lymphocytes Relative: 24 % (ref 12–46)
MCH: 28.2 pg (ref 26.0–34.0)
MCHC: 32.5 g/dL (ref 30.0–36.0)
MCV: 86.6 fL (ref 78.0–100.0)
MONOS PCT: 11 % (ref 3–12)
Monocytes Absolute: 0.9 10*3/uL (ref 0.1–1.0)
NEUTROS PCT: 63 % (ref 43–77)
Neutro Abs: 5 10*3/uL (ref 1.7–7.7)
Platelets: 379 10*3/uL (ref 150–400)
RBC: 3.73 MIL/uL — AB (ref 3.87–5.11)
RDW: 15.1 % (ref 11.5–15.5)
WBC: 8.1 10*3/uL (ref 4.0–10.5)

## 2013-09-17 LAB — URINE MICROSCOPIC-ADD ON

## 2013-09-17 MED ORDER — DEXTROSE 5 % IV SOLN
1.0000 g | Freq: Once | INTRAVENOUS | Status: AC
  Administered 2013-09-17: 1 g via INTRAVENOUS
  Filled 2013-09-17: qty 10

## 2013-09-17 MED ORDER — CEPHALEXIN 500 MG PO CAPS: 500.0000 mg | ORAL_CAPSULE | Freq: Two times a day (BID) | ORAL | Status: DC

## 2013-09-17 NOTE — Discharge Instructions (Signed)

## 2013-09-17 NOTE — ED Notes (Signed)
Patient c/o left side facial numbness with headache. Per patient had nurse check blood pressure it was 200/110. Patient reports hx of strokes. 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 Patient also reports incontinence that started this week with some abd pain. Denies any nausea, vomiting, or diarrhea.

## 2013-09-17 NOTE — ED Provider Notes (Signed)
CSN: [id number]     Arrival date & time 09/17/13  1718 History  This chart was scribed for Kelli Hacker, MD by Jeanell Sparrow, ED Scribe. This patient was seen in room APA17/APA17 and the patient's care was started at 5:48 PM.     Chief Complaint  Patient presents with  . Numbness  . Headache   HPI HPI Comments: Kelli [name] is a 63 y.o. female with a hx of strokes who presents to the Emergency Department complaining of facial numbness that started this morning. Pt reports that she has been having several strokes lately. She states that she woke up this morning with numbness in her lower lip and speech difficulty. She reports that this morning did not feel like prior episodes of stroke. Onset of symptoms greater than 8 hours ago She states that she has been having abdominal pain suprapubic discomfort and dysruria. 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 She denies any fever.   Past Medical History  Diagnosis Date  . Essential hypertension, benign   . Fibromyalgia   . Cerebral infarction Aug 2014    Bihemispheric watershed infarcts  . Mixed hyperlipidemia   . Borderline diabetes   . Urinary incontinence   . Carotid artery occlusion     Occluded RICA, status post left CEA  August 2014 - Dr. Donnetta Hutching  . CKD (chronic kidney disease) stage 3, GFR 30-59 ml/min   . 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 Closed dislocation of left humerus 07/26/2013   Past Surgical History  Procedure Laterality Date  . Urethral dilation  1980's  . Combined hysterectomy vaginal w/ mmk / a&p repair  1981  . Vaginal hysterectomy  1981    "partial" (10/04/2012)  . Endarterectomy Left 10/06/2012    Procedure: Carotid Endarterectomy with Finesse patch angioplasty;  Surgeon: [name], MD;  Location: [facility];  Service: Vascular;  Laterality: Left;  . Loop recorder implant  04/16/13    MDT LinQ implanted for cryptogenic stroke  . [name] without cardioversion N/A  04/16/2013    Procedure: TRANSESOPHAGEAL ECHOCARDIOGRAM ([name]);  Surgeon: Josue Hector, MD;  Location[facility] ENDOSCOPY;  Service: Cardiovascular;  Laterality: N/A;   Family History  Problem Relation Age of Onset  . Diabetes Mother   . Diabetes Father   . Hypertension Sister   . Diabetes Brother   . Seizures Son   . Kidney disease Maternal Grandmother   . Hypertension Maternal Grandmother   . Heart disease Maternal Grandfather   . Diabetes Paternal Grandmother   . Heart disease Paternal Grandfather    History  Substance Use Topics  . Smoking status: Former Smoker -- 1.00 packs/day for 44 years    Types: Cigarettes    Quit date: 10/03/2012  . Smokeless tobacco: Never Used  . Alcohol Use: No   OB History   Grav Para Term Preterm Abortions TAB SAB Ect Mult Living                 Review of Systems  Constitutional: Negative for fever.  Respiratory: Negative for cough, chest tightness and shortness of breath.   Cardiovascular: Negative for chest pain.  Gastrointestinal: Positive for abdominal pain. Negative for nausea and vomiting.  Genitourinary: Positive for dysuria.  Musculoskeletal: Negative for back pain.  Neurological: Positive for speech difficulty, numbness and headaches. Negative for dizziness, weakness and light-headedness.  Psychiatric/Behavioral: Negative for confusion.  All other systems reviewed and are negative.  Allergies  Hydrochlorothiazide  Home Medications   Prior to Admission medications   Medication Sig Start Date End Date Taking? Authorizing Provider  acetaminophen (TYLENOL) 325 MG tablet Take 1-2 tablets (325-650 mg total) by mouth every 4 (four) hours as needed for mild pain. 05/07/13   Ivan Anchors Love, PA-C  amLODipine (NORVASC) 10 MG tablet Take 10 mg by mouth daily.    Historical Provider, MD  aspirin EC 81 MG tablet Take 81 mg by mouth daily.    Historical Provider, MD  atenolol (TENORMIN) 25 MG tablet Take 25 mg by mouth daily.  06/16/13   Historical  Provider, MD  cephALEXin (KEFLEX) 500 MG capsule Take 1 capsule (500 mg total) by mouth 2 (two) times daily. 09/17/13   Kelli Hacker, MD  ciprofloxacin (CIPRO) 500 MG tablet Take 1 tablet (500 mg total) by mouth 2 (two) times daily. 07/31/13   Radene Gunning, NP  clonazePAM (KLONOPIN) 0.5 MG tablet Take 1/2 tablet to (2) half tablets by mouth twice daily as needed for anxiety. Note half tablets 05/09/13   Pricilla Larsson, NP  cloNIDine (CATAPRES - DOSED IN MG/24 HR) 0.3 mg/24hr patch Place 0.3 mg onto the skin once a week. On Tuesday    Historical Provider, MD  clopidogrel (PLAVIX) 75 MG tablet Take 1 tablet (75 mg total) by mouth daily with breakfast. 05/07/13   Ivan Anchors Love, PA-C  diltiazem (CARDIZEM CD) 360 MG 24 hr capsule Take 360 mg by mouth daily.    Historical Provider, MD  diphenoxylate-atropine (LOMOTIL) 2.5-0.025 MG per tablet Take 1 tablet by mouth 4 (four) times daily as needed for diarrhea or loose stools. 07/01/13   [name], MD  DULoxetine (CYMBALTA) 20 MG capsule Take 40 mg by mouth daily.    Historical Provider, MD  famotidine (PEPCID) 20 MG tablet Take 1 tablet (20 mg total) by mouth 2 (two) times daily. 07/31/13   Radene Gunning, NP  gabapentin (NEURONTIN) 300 MG capsule Take 1 capsule (300 mg total) by mouth at bedtime. 05/07/13   Bary Leriche, PA-C  hydrALAZINE (APRESOLINE) 50 MG tablet Take 1 tablet (50 mg total) by mouth 4 (four) times daily. 05/07/13   Bary Leriche, PA-C  HYDROcodone-acetaminophen (NORCO/VICODIN) 5-325 MG per tablet Take one to two tablets by mouth every 4 hours as needed for moderate pain 05/14/13   Tiffany L Reed, DO  Lactobacillus [facility] ACIDOPHILUS) CAPS Take 1 capsule by mouth daily.    Historical Provider, MD  polyethylene glycol powder (GLYCOLAX/MIRALAX) powder Take 17 g by mouth daily as needed for mild constipation.  06/16/13   Historical Provider, MD  simvastatin (ZOCOR) 10 MG tablet Take 1 tablet (10 mg total) by mouth daily at 6 PM. 10/19/12   Lavon Paganini  Angiulli, PA-C  traZODone (DESYREL) 50 MG tablet Take 1 tablet (50 mg total) by mouth at bedtime as needed for sleep. 09/03/13   Tiffany L Reed, DO   BP 174/88  Pulse 122  Temp(Src) 99 F (37.2 C) (Oral)  Resp 20  Ht 5\' 5"  (1.651 m)  Wt 192 lb (87.091 kg)  BMI 31.95 kg/m2  SpO2 98% Physical Exam  Nursing note and vitals reviewed. Constitutional: She is oriented to person, place, and time. No distress.  HENT:  Head: Normocephalic and atraumatic.  Mouth/Throat: Oropharynx is clear and moist.  Eyes: Pupils are equal, round, and reactive to light.  Neck: Neck supple.  Cardiovascular: Normal rate, regular rhythm and normal heart sounds.  No murmur heard. Pulmonary/Chest: Effort normal and breath sounds normal. No respiratory distress. She has no wheezes.  Abdominal: Soft. Bowel sounds are normal. There is no tenderness. There is no rebound and no guarding.  Musculoskeletal: She exhibits edema.  Neurological: She is alert and oriented to person, place, and time.  Mild left-sided facial droop noted with flattening of the nasolabial fold, patient can name, repeat, and has fluent speech, hemiparesis noted in left upper and lower extremity, sensation grossly intact  Skin: Skin is warm and dry.  Psychiatric: She has a normal mood and affect.    ED Course  Procedures (including critical care time) DIAGNOSTIC STUDIES: Oxygen Saturation is 98% on RA, normal by my interpretation.    COORDINATION OF CARE: 5:52 PM- Pt advised of plan for treatment which includes radiology and labs and pt agrees.  Labs Review Labs Reviewed  CBC WITH DIFFERENTIAL - Abnormal; Notable for the following:    RBC 3.73 (*)    Hemoglobin 10.5 (*)    HCT 32.3 (*)    All other components within normal limits  BASIC METABOLIC PANEL - Abnormal; Notable for the following:    Sodium 136 (*)    Potassium 3.6 (*)    Glucose, Bld 123 (*)    Creatinine, Ser 1.54 (*)    GFR calc non Af Amer 35 (*)    GFR calc Af Amer  41 (*)    All other components within normal limits  URINALYSIS, ROUTINE W REFLEX MICROSCOPIC - Abnormal; Notable for the following:    APPearance HAZY (*)    Protein, ur 100 (*)    Leukocytes, UA MODERATE (*)    All other components within normal limits  URINE MICROSCOPIC-ADD ON - Abnormal; Notable for the following:    Bacteria, UA MANY (*)    All other components within normal limits  URINE CULTURE    Imaging Review Mr Herby Abraham Contrast  09/17/2013   ADDENDUM REPORT: 09/17/2013 20:38  ADDENDUM: Study discussed by telephone with Neurology Dr. Alexis Goodell on 09/17/2013 at 2030 hrs.  Of note, the acute on chronic ischemia suggested in the left parietal lobe is discordant with the stated clinical data of new left side symptoms, and therefore may be artifactual. No acute right side ischemia identified.   Electronically Signed   By: Lars Pinks M.D.   On: 09/17/2013 20:38   09/17/2013   CLINICAL DATA:  63 year old female with increased left side numbness and headache. Initial encounter. Prior stroke.  EXAM: MRI HEAD WITHOUT CONTRAST  TECHNIQUE: Multiplanar, multiecho pulse sequences of the brain and surrounding structures were obtained without intravenous contrast.  COMPARISON:  Brain MRI 04/13/2013 and earlier.  FINDINGS: Expected evolution of the right PCA infarct seen in February. Significant right midbrain encephalomalacia also associated with that ischemia. Some residual petechial hemorrhage suspected in the midbrain. Superimposed sequelae of Wallerian degeneration in the right brainstem.  Chronic left parietal lobe encephalomalacia from small cortical and subcortical infarct. Possible superimposed mild subcortical restricted diffusion today (series 100, image 21 and series 101, image 7). No associated mass effect or hemorrhage.  Chronic encephalomalacia right superior frontal gyrus. Chronic Patchy and confluent cerebral white matter T2 and FLAIR hyperintensity with scattered chronic white matter  lacunar infarcts.  Major intracranial vascular flow voids are stable with evidence of chronic right ICA thrombosis as before.  No ventriculomegaly. No midline shift, mass effect, or evidence of intracranial mass lesion. No acute intracranial hemorrhage identified. Negative pituitary. Grossly negative visualized cervical spine.  Visualized bone marrow signal is within normal limits. Stable orbits soft tissues. Stable paranasal sinuses and mastoids. Visualized scalp soft tissues are within normal limits.  IMPRESSION: 1. Mild acute on chronic left parietal lobe small to medium-sized vessel ischemia. No mass effect or hemorrhage. 2. 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 Expected evolution of the large right PCA infarct which occurred in February and affected the midbrain, with downstream Wallerian degeneration.  Electronically Signed: By: Lars Pinks M.D. On: 09/17/2013 19:35     EKG Interpretation   Date/Time:  Monday September 17 2013 17:58:38 EDT Ventricular Rate:  116 PR Interval:  152 QRS Duration: 76 QT Interval:  358 QTC Calculation: 497 R Axis:   -10 Text Interpretation:  Sinus tachycardia Septal infarct , age undetermined  Abnormal ECG SImilar to prior, now with tachycardia Confirmed by Malakai Schoenherr   MD, Loma Sousa ([id number]) on 09/17/2013 6:03:43 PM      MDM   Final diagnoses:  UTI (lower urinary tract infection)  Numbness    Patient presents with numbness of the left face and reports changes in her speech today. Recent history of stroke. Also reports dysuria with suprapubic discomfort. She is otherwise nontoxic on exam. No appreciable new focal deficits based on prior documentation. Given history of stroke, will obtain MRI. Basic labwork also obtained.  Lab work notable for too numerous to count white cells in the urine and many bacteria. Urine culture sent. Patient has no evidence of leukocytosis.  Initially MRI possible new acute on  chronic ischemic in the left parietal fluid. This does not fit the patient's pattern of symptoms. Discussed with Dr. Doy Mince, neurology. Dr. Doy Mince reviewed the MRI and spoke with radiology. They agree patient's MRI is grossly unchanged from prior without any obvious new abnormality. Patient is on Plavix and aspirin. We'll continue this as an outpatient and followup with her primary neurologist. I will treat the patient with Keflex for UTI in the meantime. All the patient's questions were answered at the bedside.  After history, exam, and medical workup I feel the patient has been appropriately medically screened and is safe for discharge home. Pertinent diagnoses were discussed with the patient. Patient was given return precautions.  I personally performed the services described in this documentation, which was scribed in my presence. The recorded information has been reviewed and is accurate.     Kelli Hacker, MD 09/17/13 2342

## 2013-09-18 ENCOUNTER — Ambulatory Visit (INDEPENDENT_AMBULATORY_CARE_PROVIDER_SITE_OTHER): Payer: 59 | Admitting: *Deleted

## 2013-09-18 DIAGNOSIS — I639 Cerebral infarction, unspecified: Secondary | ICD-10-CM

## 2013-09-18 DIAGNOSIS — I6350 Cerebral infarction due to unspecified occlusion or stenosis of unspecified cerebral artery: Secondary | ICD-10-CM

## 2013-09-20 LAB — MDC_IDC_ENUM_SESS_TYPE_REMOTE
Date Time Interrogation Session: 20150721014221
MDC IDC SET ZONE DETECTION INTERVAL: 3000 ms
Zone Setting Detection Interval: 2000 ms
Zone Setting Detection Interval: 360 ms

## 2013-09-20 LAB — URINE CULTURE

## 2013-09-20 NOTE — Progress Notes (Signed)
Loop recorder 

## 2013-09-21 NOTE — Progress Notes (Signed)
ED Antimicrobial Stewardship Positive Culture Follow Up   Kelli [name] is an 63 y.o. female who presented to [facility] on 09/17/2013 with a chief complaint of  Chief Complaint  Patient presents with  . Numbness  . Headache    Recent Results (from the past 720 hour(s))  URINE CULTURE     Status: None   Collection Time    09/17/13  6:15 PM      Result Value Ref Range Status   Specimen Description URINE, CATHETERIZED   Final   Special Requests NONE   Final   Culture  Setup Time     Final   Value: 09/18/2013 00:35     Performed at [place] [facility]     Final   Value: >=100,000 COLONIES/ML     Performed at [facility]   Culture     Final   Value: ESCHERICHIA COLI     Performed at [facility]   Report Status 09/20/2013 FINAL   Final   Organism ID, Bacteria ESCHERICHIA COLI   Final    [x]  Treated with keflex, organism resistant to prescribed antimicrobial 63 yo who was seen for numbness and dysuria. UA looked dirty. She was dc on keflex for UTI but e.coli is resistant to it. We'll give cipro instead.  New antibiotic prescription:   Stop keflex Start Cipro 500mg  PO BID x3 days  ED Provider: Noland Fordyce, PA   Onnie Boer, PharmD Pager: [phone] Infectious Diseases Pharmacist Phone# [phone]

## 2013-09-22 ENCOUNTER — Telehealth (HOSPITAL_BASED_OUTPATIENT_CLINIC_OR_DEPARTMENT_OTHER): Payer: Self-pay

## 2013-09-22 NOTE — Telephone Encounter (Signed)
Post ED Visit - Positive Culture Follow-up: Chart Hand-off to ED Flow Manager  Culture assessed and recommendations reviewed by: []  Wes [place], Pharm.D., BCPS []  Heide Guile, Pharm.D., BCPS []  Alycia Rossetti, Pharm.D., BCPS [x]  [place], Pharm.D., BCPS, AAHIVP []  Legrand [place], Pharm .D., BCPS, AAHIVP []    Positive Urine culture, >/= 100,000 colonies -> E Coli  []  Patient discharged without antimicrobial prescription and treatment is now indicated [x]  Organism is resistant to prescribed ED discharge antimicrobial (D/C Keflex) []  Patient with positive blood cultures  Changes discussed with ED provider: E. Hilda Blades [place] New antibiotic prescription "Cipro 500 mg po BID x 3 days"   Jamie, [place] 09/22/2013, 2:37 AM

## 2013-09-24 ENCOUNTER — Telehealth (HOSPITAL_BASED_OUTPATIENT_CLINIC_OR_DEPARTMENT_OTHER): Payer: Self-pay

## 2013-09-24 NOTE — Telephone Encounter (Signed)
Pt informed of dx and need to D/C Keflex for addl tx.  Rx called to [facility] [phone] and given to [place].

## 2013-10-18 ENCOUNTER — Ambulatory Visit (INDEPENDENT_AMBULATORY_CARE_PROVIDER_SITE_OTHER): Payer: 59 | Admitting: *Deleted

## 2013-10-18 DIAGNOSIS — I6350 Cerebral infarction due to unspecified occlusion or stenosis of unspecified cerebral artery: Secondary | ICD-10-CM

## 2013-10-18 LAB — MDC_IDC_ENUM_SESS_TYPE_REMOTE

## 2013-10-19 ENCOUNTER — Encounter: Payer: Self-pay | Admitting: Internal Medicine

## 2013-10-24 ENCOUNTER — Ambulatory Visit: Payer: Self-pay | Admitting: Nurse Practitioner

## 2013-10-25 NOTE — Progress Notes (Signed)
Loop recorder 

## 2013-10-29 ENCOUNTER — Encounter: Payer: Self-pay | Admitting: *Deleted

## 2013-11-08 ENCOUNTER — Encounter: Payer: Self-pay | Admitting: Internal Medicine

## 2013-11-16 ENCOUNTER — Ambulatory Visit (INDEPENDENT_AMBULATORY_CARE_PROVIDER_SITE_OTHER): Payer: 59 | Admitting: *Deleted

## 2013-11-16 DIAGNOSIS — I639 Cerebral infarction, unspecified: Secondary | ICD-10-CM

## 2013-11-16 DIAGNOSIS — I6350 Cerebral infarction due to unspecified occlusion or stenosis of unspecified cerebral artery: Secondary | ICD-10-CM

## 2013-11-20 NOTE — Progress Notes (Signed)
Loop recorder 

## 2013-11-23 ENCOUNTER — Encounter: Payer: Self-pay | Admitting: Internal Medicine

## 2013-11-27 LAB — MDC_IDC_ENUM_SESS_TYPE_REMOTE

## 2013-12-10 ENCOUNTER — Encounter: Payer: Self-pay | Admitting: Internal Medicine

## 2013-12-14 ENCOUNTER — Other Ambulatory Visit: Payer: Self-pay

## 2013-12-17 ENCOUNTER — Ambulatory Visit (INDEPENDENT_AMBULATORY_CARE_PROVIDER_SITE_OTHER): Payer: 59 | Admitting: *Deleted

## 2013-12-17 DIAGNOSIS — I639 Cerebral infarction, unspecified: Secondary | ICD-10-CM

## 2013-12-18 LAB — MDC_IDC_ENUM_SESS_TYPE_REMOTE
MDC IDC SESS DTM: 20151024013722
MDC IDC SET ZONE DETECTION INTERVAL: 360 ms
Zone Setting Detection Interval: 2000 ms
Zone Setting Detection Interval: 3000 ms

## 2013-12-28 NOTE — Progress Notes (Signed)
Loop recorder 

## 2014-01-16 ENCOUNTER — Ambulatory Visit (INDEPENDENT_AMBULATORY_CARE_PROVIDER_SITE_OTHER): Payer: 59 | Admitting: *Deleted

## 2014-01-16 DIAGNOSIS — I639 Cerebral infarction, unspecified: Secondary | ICD-10-CM

## 2014-01-22 NOTE — Progress Notes (Signed)
Loop recorder 

## 2014-02-07 ENCOUNTER — Encounter: Payer: Self-pay | Admitting: Internal Medicine

## 2014-02-07 LAB — MDC_IDC_ENUM_SESS_TYPE_REMOTE
Date Time Interrogation Session: 20151030222316
MDC IDC SET ZONE DETECTION INTERVAL: 3000 ms
Zone Setting Detection Interval: 2000 ms
Zone Setting Detection Interval: 360 ms

## 2014-02-11 ENCOUNTER — Other Ambulatory Visit (HOSPITAL_COMMUNITY): Payer: Self-pay | Admitting: Cardiology

## 2014-02-11 DIAGNOSIS — I714 Abdominal aortic aneurysm, without rupture, unspecified: Secondary | ICD-10-CM

## 2014-02-15 ENCOUNTER — Ambulatory Visit (INDEPENDENT_AMBULATORY_CARE_PROVIDER_SITE_OTHER): Payer: 59 | Admitting: *Deleted

## 2014-02-15 DIAGNOSIS — I639 Cerebral infarction, unspecified: Secondary | ICD-10-CM

## 2014-02-15 LAB — MDC_IDC_ENUM_SESS_TYPE_REMOTE

## 2014-02-18 ENCOUNTER — Ambulatory Visit (HOSPITAL_COMMUNITY): Payer: 59 | Attending: Family Medicine | Admitting: Cardiology

## 2014-02-18 DIAGNOSIS — I129 Hypertensive chronic kidney disease with stage 1 through stage 4 chronic kidney disease, or unspecified chronic kidney disease: Secondary | ICD-10-CM | POA: Insufficient documentation

## 2014-02-18 DIAGNOSIS — I2510 Atherosclerotic heart disease of native coronary artery without angina pectoris: Secondary | ICD-10-CM | POA: Diagnosis not present

## 2014-02-18 DIAGNOSIS — I77819 Aortic ectasia, unspecified site: Secondary | ICD-10-CM | POA: Diagnosis not present

## 2014-02-18 DIAGNOSIS — I714 Abdominal aortic aneurysm, without rupture, unspecified: Secondary | ICD-10-CM

## 2014-02-18 DIAGNOSIS — E785 Hyperlipidemia, unspecified: Secondary | ICD-10-CM | POA: Diagnosis not present

## 2014-02-18 DIAGNOSIS — Z8673 Personal history of transient ischemic attack (TIA), and cerebral infarction without residual deficits: Secondary | ICD-10-CM | POA: Insufficient documentation

## 2014-02-18 DIAGNOSIS — N183 Chronic kidney disease, stage 3 (moderate): Secondary | ICD-10-CM | POA: Insufficient documentation

## 2014-02-18 DIAGNOSIS — E119 Type 2 diabetes mellitus without complications: Secondary | ICD-10-CM | POA: Diagnosis not present

## 2014-02-18 DIAGNOSIS — I745 Embolism and thrombosis of iliac artery: Secondary | ICD-10-CM | POA: Diagnosis present

## 2014-02-18 NOTE — Progress Notes (Signed)
Aorta duplex performed

## 2014-02-19 NOTE — Progress Notes (Signed)
Loop recorder 

## 2014-02-26 ENCOUNTER — Other Ambulatory Visit: Payer: Self-pay | Admitting: Internal Medicine

## 2014-02-26 ENCOUNTER — Encounter: Payer: Self-pay | Admitting: Internal Medicine

## 2014-03-18 ENCOUNTER — Ambulatory Visit (INDEPENDENT_AMBULATORY_CARE_PROVIDER_SITE_OTHER): Payer: Self-pay | Admitting: *Deleted

## 2014-03-18 DIAGNOSIS — I639 Cerebral infarction, unspecified: Secondary | ICD-10-CM

## 2014-03-18 LAB — MDC_IDC_ENUM_SESS_TYPE_REMOTE

## 2014-03-19 NOTE — Progress Notes (Signed)
Loop recorder 

## 2014-03-26 ENCOUNTER — Encounter: Payer: Self-pay | Admitting: Internal Medicine

## 2014-04-16 ENCOUNTER — Ambulatory Visit (INDEPENDENT_AMBULATORY_CARE_PROVIDER_SITE_OTHER): Payer: 59 | Admitting: *Deleted

## 2014-04-16 DIAGNOSIS — I639 Cerebral infarction, unspecified: Secondary | ICD-10-CM

## 2014-04-17 ENCOUNTER — Encounter: Payer: Self-pay | Admitting: Internal Medicine

## 2014-04-17 LAB — MDC_IDC_ENUM_SESS_TYPE_REMOTE
MDC IDC SESS DTM: 20160221035901
MDC IDC SET ZONE DETECTION INTERVAL: 2000 ms
MDC IDC SET ZONE DETECTION INTERVAL: 3000 ms
Zone Setting Detection Interval: 360 ms

## 2014-04-17 NOTE — Progress Notes (Signed)
Loop recorder 

## 2014-04-23 ENCOUNTER — Encounter: Payer: Self-pay | Admitting: Internal Medicine

## 2014-04-25 ENCOUNTER — Emergency Department (HOSPITAL_COMMUNITY): Payer: 59

## 2014-04-25 ENCOUNTER — Inpatient Hospital Stay (HOSPITAL_COMMUNITY)
Admission: EM | Admit: 2014-04-25 | Discharge: 2014-04-30 | DRG: 377 | Disposition: A | Discharge status: Home / Self Care | Payer: 59 | Attending: Internal Medicine | Admitting: Internal Medicine

## 2014-04-25 ENCOUNTER — Encounter (HOSPITAL_COMMUNITY): Payer: Self-pay | Admitting: Emergency Medicine

## 2014-04-25 DIAGNOSIS — D62 Acute posthemorrhagic anemia: Secondary | ICD-10-CM | POA: Diagnosis present

## 2014-04-25 DIAGNOSIS — K449 Diaphragmatic hernia without obstruction or gangrene: Secondary | ICD-10-CM | POA: Diagnosis present

## 2014-04-25 DIAGNOSIS — K210 Gastro-esophageal reflux disease with esophagitis: Secondary | ICD-10-CM | POA: Diagnosis present

## 2014-04-25 DIAGNOSIS — K922 Gastrointestinal hemorrhage, unspecified: Secondary | ICD-10-CM | POA: Diagnosis not present

## 2014-04-25 DIAGNOSIS — N390 Urinary tract infection, site not specified: Secondary | ICD-10-CM | POA: Diagnosis present

## 2014-04-25 DIAGNOSIS — F411 Generalized anxiety disorder: Secondary | ICD-10-CM | POA: Diagnosis present

## 2014-04-25 DIAGNOSIS — N183 Chronic kidney disease, stage 3 unspecified: Secondary | ICD-10-CM | POA: Diagnosis present

## 2014-04-25 DIAGNOSIS — K2211 Ulcer of esophagus with bleeding: Secondary | ICD-10-CM | POA: Diagnosis present

## 2014-04-25 DIAGNOSIS — Z7982 Long term (current) use of aspirin: Secondary | ICD-10-CM

## 2014-04-25 DIAGNOSIS — M797 Fibromyalgia: Secondary | ICD-10-CM | POA: Diagnosis present

## 2014-04-25 DIAGNOSIS — N179 Acute kidney failure, unspecified: Secondary | ICD-10-CM | POA: Diagnosis present

## 2014-04-25 DIAGNOSIS — R571 Hypovolemic shock: Secondary | ICD-10-CM

## 2014-04-25 DIAGNOSIS — Z7902 Long term (current) use of antithrombotics/antiplatelets: Secondary | ICD-10-CM

## 2014-04-25 DIAGNOSIS — Z8673 Personal history of transient ischemic attack (TIA), and cerebral infarction without residual deficits: Secondary | ICD-10-CM

## 2014-04-25 DIAGNOSIS — E782 Mixed hyperlipidemia: Secondary | ICD-10-CM | POA: Diagnosis present

## 2014-04-25 DIAGNOSIS — I2510 Atherosclerotic heart disease of native coronary artery without angina pectoris: Secondary | ICD-10-CM | POA: Diagnosis present

## 2014-04-25 DIAGNOSIS — Z993 Dependence on wheelchair: Secondary | ICD-10-CM

## 2014-04-25 DIAGNOSIS — K264 Chronic or unspecified duodenal ulcer with hemorrhage: Secondary | ICD-10-CM | POA: Diagnosis not present

## 2014-04-25 DIAGNOSIS — R739 Hyperglycemia, unspecified: Secondary | ICD-10-CM | POA: Diagnosis present

## 2014-04-25 DIAGNOSIS — K921 Melena: Secondary | ICD-10-CM | POA: Diagnosis present

## 2014-04-25 DIAGNOSIS — N393 Stress incontinence (female) (male): Secondary | ICD-10-CM | POA: Diagnosis present

## 2014-04-25 DIAGNOSIS — F329 Major depressive disorder, single episode, unspecified: Secondary | ICD-10-CM | POA: Diagnosis present

## 2014-04-25 DIAGNOSIS — Z720 Tobacco use: Secondary | ICD-10-CM | POA: Diagnosis present

## 2014-04-25 DIAGNOSIS — Z87891 Personal history of nicotine dependence: Secondary | ICD-10-CM

## 2014-04-25 DIAGNOSIS — E876 Hypokalemia: Secondary | ICD-10-CM | POA: Diagnosis not present

## 2014-04-25 DIAGNOSIS — I129 Hypertensive chronic kidney disease with stage 1 through stage 4 chronic kidney disease, or unspecified chronic kidney disease: Secondary | ICD-10-CM | POA: Diagnosis present

## 2014-04-25 DIAGNOSIS — K254 Chronic or unspecified gastric ulcer with hemorrhage: Secondary | ICD-10-CM | POA: Diagnosis present

## 2014-04-25 DIAGNOSIS — I10 Essential (primary) hypertension: Secondary | ICD-10-CM | POA: Diagnosis present

## 2014-04-25 LAB — URINALYSIS, ROUTINE W REFLEX MICROSCOPIC
Bilirubin Urine: NEGATIVE
Glucose, UA: NEGATIVE mg/dL
HGB URINE DIPSTICK: NEGATIVE
Ketones, ur: NEGATIVE mg/dL
LEUKOCYTES UA: NEGATIVE
Nitrite: NEGATIVE
PROTEIN: 100 mg/dL — AB
SPECIFIC GRAVITY, URINE: 1.02 (ref 1.005–1.030)
UROBILINOGEN UA: 0.2 mg/dL (ref 0.0–1.0)
pH: 5.5 (ref 5.0–8.0)

## 2014-04-25 LAB — URINE MICROSCOPIC-ADD ON

## 2014-04-25 MED ORDER — ONDANSETRON HCL 4 MG/2ML IJ SOLN
INTRAMUSCULAR | Status: AC
  Filled 2014-04-25: qty 2

## 2014-04-25 MED ORDER — DIAZEPAM 5 MG/ML IJ SOLN
2.0000 mg | Freq: Once | INTRAMUSCULAR | Status: AC
  Administered 2014-04-26: 2 mg via INTRAVENOUS
  Filled 2014-04-25: qty 2

## 2014-04-25 MED ORDER — ONDANSETRON HCL 4 MG/2ML IJ SOLN
4.0000 mg | Freq: Once | INTRAMUSCULAR | Status: AC
  Administered 2014-04-25: 4 mg via INTRAVENOUS

## 2014-04-25 NOTE — ED Provider Notes (Signed)
CSN: [id number]     Arrival date & time 04/25/14  2150 History   First MD Initiated Contact with Patient 04/25/14 2237     Chief Complaint  Patient presents with  . Weakness  . Emesis     (Consider location/radiation/quality/duration/timing/severity/associated sxs/prior Treatment) The history is provided by the patient.   Kelli [name] is a 64 y.o. 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  She denies diarrhea, instead has been constipated and has had nausea without emesis.  She denies fevers, chills, abdominal pain or distention.  She denies history of significant GERD or PUD, reports rarely needing a zantac tablet. She is on plavix and aspirin daily and denies using any other blood thinners, nsaids or anti-platelets.  Additionally, she has had cough productive of clear sputum along with shortness of breath for the past several days.  She denies chest pain.   Past Medical History  Diagnosis Date  . Essential hypertension, benign   . Fibromyalgia   . Cerebral infarction Aug 2014    Bihemispheric watershed infarcts  . Mixed hyperlipidemia   . Borderline diabetes   . Urinary incontinence   . Carotid artery occlusion     Occluded RICA, status post left CEA  August 2014 - Dr. Donnetta Hutching  . CKD (chronic kidney disease) stage 3, GFR 30-59 ml/min   . 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 Closed dislocation of left humerus 07/26/2013   Past Surgical History  Procedure Laterality Date  . Urethral dilation  1980's  . Combined hysterectomy vaginal w/ mmk / a&p repair  1981  . Vaginal hysterectomy  1981    "partial" (10/04/2012)  . Endarterectomy Left 10/06/2012    Procedure: Carotid Endarterectomy with Finesse patch angioplasty;  Surgeon: [name], MD;   Location: [facility];  Service: Vascular;  Laterality: Left;  . Loop recorder implant  04/16/13    MDT LinQ implanted for cryptogenic stroke  . [name] without cardioversion N/A 04/16/2013    Procedure: TRANSESOPHAGEAL ECHOCARDIOGRAM ([name]);  Surgeon: Josue Hector, MD;  Location: [facility] ENDOSCOPY;  Service: Cardiovascular;  Laterality: N/A;   Family History  Problem Relation Age of Onset  . Diabetes Mother   . Diabetes Father   . Hypertension Sister   . Diabetes Brother   . Seizures Son   . Kidney disease Maternal Grandmother   . Hypertension Maternal Grandmother   . Heart disease Maternal Grandfather   . Diabetes Paternal Grandmother   . Heart disease Paternal Grandfather    History  Substance Use Topics  . Smoking status: Former Smoker -- 1.00 packs/day for 44 years    Types: Cigarettes    Quit date: 10/03/2012  . Smokeless tobacco: Never Used  . Alcohol Use: No   OB History    No data available     Review of Systems  Constitutional: Positive for fatigue. Negative for fever.  HENT: Negative for congestion and sore throat.   Eyes: Negative.   Respiratory: Positive for cough and shortness of breath. Negative for chest tightness.   Cardiovascular: Negative for chest pain.  Gastrointestinal: Positive for nausea and constipation. Negative for vomiting, abdominal pain, blood in stool and abdominal distention.  Genitourinary: Negative.   Musculoskeletal: Negative for joint swelling, arthralgias and neck pain.  Skin: Negative.  Negative for rash  and wound.  Neurological: Positive for weakness. Negative for dizziness, light-headedness, numbness and headaches.  Psychiatric/Behavioral: Negative.       Allergies  Hydrochlorothiazide  Home Medications   Prior to Admission medications   Medication Sig Start Date End Date Taking? Authorizing Provider  amLODipine (NORVASC) 10 MG tablet Take 10 mg by mouth daily.   Yes Historical Provider, MD  aspirin 325 MG tablet Take 325 mg by mouth  daily.   Yes Historical Provider, MD  clopidogrel (PLAVIX) 75 MG tablet Take 1 tablet (75 mg total) by mouth daily with breakfast. 05/07/13  Yes Ivan Anchors Love, PA-C  hydrALAZINE (APRESOLINE) 50 MG tablet Take 1 tablet (50 mg total) by mouth 4 (four) times daily. 05/07/13  Yes Ivan Anchors Love, PA-C  simvastatin (ZOCOR) 10 MG tablet Take 1 tablet (10 mg total) by mouth daily at 6 PM. 10/19/12  Yes Lavon Paganini Angiulli, PA-C  tolterodine (DETROL LA) 4 MG 24 hr capsule Take 4 mg by mouth daily.   Yes Historical Provider, MD  venlafaxine XR (EFFEXOR-XR) 150 MG 24 hr capsule Take 150 mg by mouth daily with breakfast.   Yes Historical Provider, MD  acetaminophen (TYLENOL) 325 MG tablet Take 1-2 tablets (325-650 mg total) by mouth every 4 (four) hours as needed for mild pain. 05/07/13   Ivan Anchors Love, PA-C  atenolol (TENORMIN) 25 MG tablet Take 25 mg by mouth daily.  06/16/13   Historical Provider, MD  clonazePAM (KLONOPIN) 0.5 MG tablet Take 1/2 tablet to (2) half tablets by mouth twice daily as needed for anxiety. Note half tablets 05/09/13   Lauree Chandler, NP  cloNIDine (CATAPRES - DOSED IN MG/24 HR) 0.3 mg/24hr patch Place 0.3 mg onto the skin once a week. On Tuesday    Historical Provider, MD  diltiazem (CARDIZEM CD) 360 MG 24 hr capsule Take 360 mg by mouth daily.    Historical Provider, MD  diphenoxylate-atropine (LOMOTIL) 2.5-0.025 MG per tablet Take 1 tablet by mouth 4 (four) times daily as needed for diarrhea or loose stools. 07/01/13   [name], MD  DULoxetine (CYMBALTA) 20 MG capsule Take 40 mg by mouth daily.    Historical Provider, MD  famotidine (PEPCID) 20 MG tablet Take 1 tablet (20 mg total) by mouth 2 (two) times daily. 07/31/13   Radene Gunning, NP  gabapentin (NEURONTIN) 300 MG capsule Take 1 capsule (300 mg total) by mouth at bedtime. 05/07/13   Bary Leriche, PA-C  HYDROcodone-acetaminophen (NORCO/VICODIN) 5-325 MG per tablet Take one to two tablets by mouth every 4 hours as needed for moderate pain  05/14/13   Tiffany L Reed, DO  Lactobacillus [facility] ACIDOPHILUS) CAPS Take 1 capsule by mouth daily.    Historical Provider, MD  polyethylene glycol powder (GLYCOLAX/MIRALAX) powder Take 17 g by mouth daily as needed for mild constipation.  06/16/13   Historical Provider, MD  traZODone (DESYREL) 50 MG tablet Take 1 tablet (50 mg total) by mouth at bedtime as needed for sleep. 09/03/13   Tiffany L Reed, DO   BP 165/66 mmHg  Pulse 118  Temp(Src) 98.8 F (37.1 C) (Oral)  Resp 18  Ht 5\' 3"  (1.6 m)  Wt 185 lb (83.915 kg)  BMI 32.78 kg/m2  SpO2 100% Physical Exam  Constitutional: She appears well-developed and well-nourished.  HENT:  Head: Normocephalic and atraumatic.  Eyes:  Conjunctival pallor.  Neck: Normal range of motion.  Cardiovascular: Regular rhythm, normal heart sounds and intact distal pulses.  Tachycardia present.  Hypertensive.    Pulmonary/Chest: Effort normal and breath sounds normal. She has no wheezes.  Abdominal: Soft. Bowel sounds are normal. She exhibits no distension. There is no tenderness. There is no guarding.  Genitourinary: Guaiac positive stool.  Strongly hemoccult positive. Not grossly bloody, dark scant fecal material.  Musculoskeletal: Normal range of motion.  Bilateral 2+ pitting ankle edema.  Neurological: She is alert.  Skin: Skin is warm and dry. There is pallor.  Psychiatric: She has a normal mood and affect.  Nursing note and vitals reviewed.   ED Course  Procedures (including critical care time) Labs Review Labs Reviewed  CBC WITH DIFFERENTIAL/PLATELET - Abnormal; Notable for the following:    RBC 1.73 (*)    Hemoglobin 4.9 (*)    HCT 14.9 (*)    All other components within normal limits  BASIC METABOLIC PANEL - Abnormal; Notable for the following:    Sodium 134 (*)    Potassium 3.3 (*)    Glucose, Bld 225 (*)    BUN 40 (*)    Creatinine, Ser 1.50 (*)    Calcium 7.8 (*)    GFR calc non Af Amer 36 (*)    GFR calc Af Amer 42 (*)     All other components within normal limits  URINALYSIS, ROUTINE W REFLEX MICROSCOPIC - Abnormal; Notable for the following:    Protein, ur 100 (*)    All other components within normal limits  URINE MICROSCOPIC-ADD ON - Abnormal; Notable for the following:    Bacteria, UA FEW (*)    All other components within normal limits  BRAIN NATRIURETIC PEPTIDE  TROPONIN I  POC OCCULT BLOOD, ED  I-STAT CG4 LACTIC ACID, ED  TYPE AND SCREEN  PREPARE RBC (CROSSMATCH)  ABO/RH    Imaging Review Dg Chest 2 View  04/26/2014   CLINICAL DATA:  Nausea, vomiting, and generalized weakness.  EXAM: CHEST  2 VIEW  COMPARISON:  Radiographs 07/30/2013. CT 04/17/2013. Left shoulder series 07/26/13  FINDINGS: Mild hyperinflation. Borderline cardiomegaly again seen. There is minimal blunting of left costophrenic angle without frank pleural effusion. Pulmonary vasculature is normal. No consolidation or pneumothorax. No acute osseous abnormalities are seen. Pseudosubluxation of the left shoulder is unchanged from prior exams. The bones appear under mineralized.  IMPRESSION: Mild hyperinflation and borderline cardiomegaly. Minimal blunting of left costophrenic angle is likely subsegmental atelectasis.   Electronically Signed   By: Jeb Levering M.D.   On: 04/26/2014 01:16     EKG Interpretation   Date/Time:  Thursday April 25 2014 22:14:42 EST Ventricular Rate:  118 PR Interval:  158 QRS Duration: 79 QT Interval:  333 QTC Calculation: 466 R Axis:   11 Text Interpretation:  Sinus tachycardia Borderline ST depression, lateral  leads When compared with ECG of 09/17/2013, QT has shortened Confirmed by  [facility]  MD, DAVID ([id number]) on 04/26/2014 12:16:44 AM      MDM   Final diagnoses:  Upper GI bleed    Patients labs and/or radiological studies were viewed and considered during the medical decision making and disposition process. Pt was seen by Dr Roxanne Mins during this visit. Orders placed for transfusing 2 units.   Protonix IV.    Discussed with Dr. Ernestina Patches who will see pt in ed.  No temp holding orders given.    Evalee Jefferson, PA-C [id number]  Delora Fuel, MD [id number]

## 2014-04-25 NOTE — ED Notes (Signed)
Patient reports nausea,vomiting, and generalized weakness for 2 days.

## 2014-04-25 NOTE — ED Notes (Signed)
Patient states black stools that she noticed today. Denies diarrhea, but reports constipation.

## 2014-04-25 NOTE — ED Notes (Signed)
Patient also reports productive cough with yellow/brown sputum

## 2014-04-26 ENCOUNTER — Encounter (HOSPITAL_COMMUNITY)
Admission: EM | Disposition: A | Discharge status: Home / Self Care | Payer: Self-pay | Source: Home / Self Care | Attending: Pulmonary Disease

## 2014-04-26 ENCOUNTER — Encounter (HOSPITAL_COMMUNITY): Payer: Self-pay | Admitting: *Deleted

## 2014-04-26 DIAGNOSIS — I2510 Atherosclerotic heart disease of native coronary artery without angina pectoris: Secondary | ICD-10-CM | POA: Diagnosis present

## 2014-04-26 DIAGNOSIS — K922 Gastrointestinal hemorrhage, unspecified: Secondary | ICD-10-CM | POA: Diagnosis present

## 2014-04-26 DIAGNOSIS — K921 Melena: Secondary | ICD-10-CM | POA: Diagnosis present

## 2014-04-26 DIAGNOSIS — K449 Diaphragmatic hernia without obstruction or gangrene: Secondary | ICD-10-CM | POA: Diagnosis present

## 2014-04-26 DIAGNOSIS — Z993 Dependence on wheelchair: Secondary | ICD-10-CM | POA: Diagnosis not present

## 2014-04-26 DIAGNOSIS — F329 Major depressive disorder, single episode, unspecified: Secondary | ICD-10-CM | POA: Diagnosis present

## 2014-04-26 DIAGNOSIS — Z7982 Long term (current) use of aspirin: Secondary | ICD-10-CM | POA: Diagnosis not present

## 2014-04-26 DIAGNOSIS — Z87891 Personal history of nicotine dependence: Secondary | ICD-10-CM | POA: Diagnosis not present

## 2014-04-26 DIAGNOSIS — R571 Hypovolemic shock: Secondary | ICD-10-CM

## 2014-04-26 DIAGNOSIS — E876 Hypokalemia: Secondary | ICD-10-CM | POA: Diagnosis not present

## 2014-04-26 DIAGNOSIS — K254 Chronic or unspecified gastric ulcer with hemorrhage: Secondary | ICD-10-CM | POA: Diagnosis present

## 2014-04-26 DIAGNOSIS — M797 Fibromyalgia: Secondary | ICD-10-CM | POA: Diagnosis present

## 2014-04-26 DIAGNOSIS — K2211 Ulcer of esophagus with bleeding: Secondary | ICD-10-CM | POA: Diagnosis present

## 2014-04-26 DIAGNOSIS — E782 Mixed hyperlipidemia: Secondary | ICD-10-CM | POA: Diagnosis present

## 2014-04-26 DIAGNOSIS — N183 Chronic kidney disease, stage 3 (moderate): Secondary | ICD-10-CM

## 2014-04-26 DIAGNOSIS — N393 Stress incontinence (female) (male): Secondary | ICD-10-CM | POA: Diagnosis present

## 2014-04-26 DIAGNOSIS — N179 Acute kidney failure, unspecified: Secondary | ICD-10-CM | POA: Diagnosis present

## 2014-04-26 DIAGNOSIS — F411 Generalized anxiety disorder: Secondary | ICD-10-CM | POA: Diagnosis present

## 2014-04-26 DIAGNOSIS — N390 Urinary tract infection, site not specified: Secondary | ICD-10-CM | POA: Diagnosis present

## 2014-04-26 DIAGNOSIS — Z7902 Long term (current) use of antithrombotics/antiplatelets: Secondary | ICD-10-CM | POA: Diagnosis not present

## 2014-04-26 DIAGNOSIS — I10 Essential (primary) hypertension: Secondary | ICD-10-CM

## 2014-04-26 DIAGNOSIS — R739 Hyperglycemia, unspecified: Secondary | ICD-10-CM | POA: Diagnosis present

## 2014-04-26 DIAGNOSIS — I129 Hypertensive chronic kidney disease with stage 1 through stage 4 chronic kidney disease, or unspecified chronic kidney disease: Secondary | ICD-10-CM | POA: Diagnosis present

## 2014-04-26 DIAGNOSIS — K210 Gastro-esophageal reflux disease with esophagitis: Secondary | ICD-10-CM | POA: Diagnosis present

## 2014-04-26 DIAGNOSIS — Z8673 Personal history of transient ischemic attack (TIA), and cerebral infarction without residual deficits: Secondary | ICD-10-CM | POA: Diagnosis not present

## 2014-04-26 DIAGNOSIS — K274 Chronic or unspecified peptic ulcer, site unspecified, with hemorrhage: Secondary | ICD-10-CM

## 2014-04-26 DIAGNOSIS — K264 Chronic or unspecified duodenal ulcer with hemorrhage: Secondary | ICD-10-CM | POA: Diagnosis present

## 2014-04-26 DIAGNOSIS — D62 Acute posthemorrhagic anemia: Secondary | ICD-10-CM | POA: Diagnosis present

## 2014-04-26 HISTORY — PX: ESOPHAGOGASTRODUODENOSCOPY: SHX5428

## 2014-04-26 LAB — BASIC METABOLIC PANEL
Anion gap: 7 (ref 5–15)
BUN: 40 mg/dL — ABNORMAL HIGH (ref 6–23)
CALCIUM: 7.8 mg/dL — AB (ref 8.4–10.5)
CO2: 22 mmol/L (ref 19–32)
Chloride: 105 mmol/L (ref 96–112)
Creatinine, Ser: 1.5 mg/dL — ABNORMAL HIGH (ref 0.50–1.10)
GFR calc Af Amer: 42 mL/min — ABNORMAL LOW (ref >90)
GFR calc non Af Amer: 36 mL/min — ABNORMAL LOW (ref >90)
GLUCOSE: 225 mg/dL — AB (ref 70–99)
Potassium: 3.3 mmol/L — ABNORMAL LOW (ref 3.5–5.1)
Sodium: 134 mmol/L — ABNORMAL LOW (ref 135–145)

## 2014-04-26 LAB — CBC WITH DIFFERENTIAL/PLATELET
Basophils Absolute: 0.1 10*3/uL (ref 0.0–0.1)
Basophils Relative: 0 % (ref 0–1)
Eosinophils Absolute: 0.1 10*3/uL (ref 0.0–0.7)
Eosinophils Relative: 1 % (ref 0–5)
HCT: 14.9 % — ABNORMAL LOW (ref 36.0–46.0)
Hemoglobin: 4.9 g/dL — CL (ref 12.0–15.0)
Lymphocytes Relative: 11 % — ABNORMAL LOW (ref 12–46)
Lymphs Abs: 2 10*3/uL (ref 0.7–4.0)
MCH: 28.3 pg (ref 26.0–34.0)
MCHC: 32.9 g/dL (ref 30.0–36.0)
MCV: 86.1 fL (ref 78.0–100.0)
MONO ABS: 1.3 10*3/uL — AB (ref 0.1–1.0)
Monocytes Relative: 7 % (ref 3–12)
Neutro Abs: 15.2 10*3/uL — ABNORMAL HIGH (ref 1.7–7.7)
Neutrophils Relative %: 81 % — ABNORMAL HIGH (ref 43–77)
Platelets: 355 10*3/uL (ref 150–400)
RBC: 1.73 MIL/uL — AB (ref 3.87–5.11)
RDW: 15.2 % (ref 11.5–15.5)
WBC MORPHOLOGY: INCREASED
WBC: 17.7 10*3/uL — AB (ref 4.0–10.5)

## 2014-04-26 LAB — CBC
HCT: 13.6 % — ABNORMAL LOW (ref 36.0–46.0)
HCT: 29.6 % — ABNORMAL LOW (ref 36.0–46.0)
HCT: 26.7 % — ABNORMAL LOW (ref 36.0–46.0)
Hemoglobin: 4.4 g/dL — CL (ref 12.0–15.0)
Hemoglobin: 10.1 g/dL — ABNORMAL LOW (ref 12.0–15.0)
Hemoglobin: 9.2 g/dL — ABNORMAL LOW (ref 12.0–15.0)
MCH: 27.8 pg (ref 26.0–34.0)
MCH: 28.9 pg (ref 26.0–34.0)
MCH: 28.6 pg (ref 26.0–34.0)
MCHC: 32.4 g/dL (ref 30.0–36.0)
MCHC: 34.1 g/dL (ref 30.0–36.0)
MCHC: 34.5 g/dL (ref 30.0–36.0)
MCV: 86.1 fL (ref 78.0–100.0)
MCV: 84.6 fL (ref 78.0–100.0)
MCV: 82.9 fL (ref 78.0–100.0)
PLATELETS: 342 10*3/uL (ref 150–400)
Platelets: 241 10*3/uL (ref 150–400)
Platelets: 241 10*3/uL (ref 150–400)
RBC: 1.58 MIL/uL — AB (ref 3.87–5.11)
RBC: 3.5 MIL/uL — AB (ref 3.87–5.11)
RBC: 3.22 MIL/uL — AB (ref 3.87–5.11)
RDW: 15.4 % (ref 11.5–15.5)
RDW: 14.8 % (ref 11.5–15.5)
RDW: 14.7 % (ref 11.5–15.5)
WBC: 18.3 10*3/uL — AB (ref 4.0–10.5)
WBC: 12.8 10*3/uL — ABNORMAL HIGH (ref 4.0–10.5)
WBC: 10.8 10*3/uL — AB (ref 4.0–10.5)

## 2014-04-26 LAB — PREPARE RBC (CROSSMATCH)

## 2014-04-26 LAB — TROPONIN I: Troponin I: 0.03 ng/mL (ref <0.031)

## 2014-04-26 LAB — I-STAT CG4 LACTIC ACID, ED: LACTIC ACID, VENOUS: 1.26 mmol/L (ref 0.5–2.0)

## 2014-04-26 LAB — GLUCOSE, CAPILLARY: Glucose-Capillary: 165 mg/dL — ABNORMAL HIGH (ref 70–99)

## 2014-04-26 LAB — MRSA PCR SCREENING: MRSA BY PCR: NEGATIVE

## 2014-04-26 LAB — ABO/RH: ABO/RH(D): A NEG

## 2014-04-26 LAB — BRAIN NATRIURETIC PEPTIDE: B Natriuretic Peptide: 62 pg/mL (ref 0.0–100.0)

## 2014-04-26 LAB — OCCULT BLOOD, POC DEVICE: FECAL OCCULT BLD: POSITIVE — AB

## 2014-04-26 SURGERY — EGD (ESOPHAGOGASTRODUODENOSCOPY)
Anesthesia: Moderate Sedation

## 2014-04-26 MED ORDER — SODIUM CHLORIDE 0.9 % IV BOLUS (SEPSIS)
1000.0000 mL | Freq: Once | INTRAVENOUS | Status: AC
  Administered 2014-04-26: 1000 mL via INTRAVENOUS

## 2014-04-26 MED ORDER — PANTOPRAZOLE SODIUM 40 MG IV SOLR: 40.0000 mg | Freq: Two times a day (BID) | INTRAVENOUS | Status: DC

## 2014-04-26 MED ORDER — SODIUM CHLORIDE 0.9 % IV SOLN
INTRAVENOUS | Status: DC
  Administered 2014-04-26 – 2014-04-28 (×2): via INTRAVENOUS

## 2014-04-26 MED ORDER — PANTOPRAZOLE SODIUM 40 MG IV SOLR
INTRAVENOUS | Status: AC
  Filled 2014-04-26: qty 80

## 2014-04-26 MED ORDER — SODIUM CHLORIDE 0.9 % IV SOLN
8.0000 mg/h | INTRAVENOUS | Status: DC
  Administered 2014-04-26: 8 mg/h via INTRAVENOUS
  Filled 2014-04-26 (×11): qty 80

## 2014-04-26 MED ORDER — MIDAZOLAM HCL 5 MG/ML IJ SOLN
INTRAMUSCULAR | Status: AC
  Filled 2014-04-26: qty 2

## 2014-04-26 MED ORDER — SODIUM CHLORIDE 0.9 % IV SOLN
80.0000 mg | Freq: Once | INTRAVENOUS | Status: AC
  Administered 2014-04-26: 80 mg via INTRAVENOUS
  Filled 2014-04-26: qty 80

## 2014-04-26 MED ORDER — ACETAMINOPHEN 325 MG PO TABS
ORAL_TABLET | ORAL | Status: AC
  Filled 2014-04-26: qty 2

## 2014-04-26 MED ORDER — SODIUM CHLORIDE 0.9 % IV SOLN
10.0000 mL/h | Freq: Once | INTRAVENOUS | Status: AC
  Administered 2014-04-26: 10 mL/h via INTRAVENOUS

## 2014-04-26 MED ORDER — SODIUM CHLORIDE 0.9 % IV SOLN: Freq: Once | INTRAVENOUS | Status: DC

## 2014-04-26 MED ORDER — FENTANYL CITRATE 0.05 MG/ML IJ SOLN
25.0000 ug | INTRAMUSCULAR | Status: DC | PRN
Start: 2014-04-26 — End: 2014-04-30
  Administered 2014-04-27 (×2): 25 ug via INTRAVENOUS
  Filled 2014-04-26 (×2): qty 2

## 2014-04-26 MED ORDER — DEXTROSE 5 % IV SOLN
1.0000 g | INTRAVENOUS | Status: DC
  Administered 2014-04-26: 1 g via INTRAVENOUS
  Filled 2014-04-26 (×4): qty 10

## 2014-04-26 MED ORDER — SODIUM CHLORIDE 0.9 % IV SOLN
8.0000 mg/h | INTRAVENOUS | Status: DC
  Administered 2014-04-26 – 2014-04-28 (×3): 8 mg/h via INTRAVENOUS
  Filled 2014-04-26 (×10): qty 80

## 2014-04-26 MED ORDER — MIDAZOLAM HCL 10 MG/2ML IJ SOLN
INTRAMUSCULAR | Status: DC | PRN
  Administered 2014-04-26: 2 mg via INTRAVENOUS
  Administered 2014-04-26: 1 mg via INTRAVENOUS
  Administered 2014-04-26: 2 mg via INTRAVENOUS

## 2014-04-26 MED ORDER — ACETAMINOPHEN 325 MG PO TABS: 650.0000 mg | ORAL_TABLET | Freq: Once | ORAL | Status: DC

## 2014-04-26 MED ORDER — ONDANSETRON HCL 4 MG/2ML IJ SOLN
4.0000 mg | Freq: Four times a day (QID) | INTRAMUSCULAR | Status: DC
  Administered 2014-04-26 – 2014-04-30 (×13): 4 mg via INTRAVENOUS
  Filled 2014-04-26 (×14): qty 2

## 2014-04-26 MED ORDER — FENTANYL CITRATE 0.05 MG/ML IJ SOLN
INTRAMUSCULAR | Status: DC | PRN
Start: 2014-04-26 — End: 2014-04-26
  Administered 2014-04-26 (×2): 25 ug via INTRAVENOUS

## 2014-04-26 MED ORDER — MORPHINE SULFATE 2 MG/ML IJ SOLN
2.0000 mg | INTRAMUSCULAR | Status: DC | PRN
  Administered 2014-04-26: 2 mg via INTRAVENOUS
  Filled 2014-04-26: qty 1

## 2014-04-26 MED ORDER — PANTOPRAZOLE SODIUM 40 MG IV SOLR
80.0000 mg | Freq: Once | INTRAVENOUS | Status: DC
  Filled 2014-04-26: qty 80

## 2014-04-26 MED ORDER — SODIUM CHLORIDE 0.9 % IV SOLN: Freq: Once | INTRAVENOUS | Status: AC

## 2014-04-26 MED ORDER — SODIUM CHLORIDE 0.9 % IV SOLN: INTRAVENOUS | Status: DC

## 2014-04-26 MED ORDER — SODIUM CHLORIDE 0.9 % IJ SOLN
3.0000 mL | Freq: Two times a day (BID) | INTRAMUSCULAR | Status: DC
  Administered 2014-04-26 – 2014-04-30 (×7): 3 mL via INTRAVENOUS

## 2014-04-26 MED ORDER — FENTANYL CITRATE 0.05 MG/ML IJ SOLN
INTRAMUSCULAR | Status: AC
  Filled 2014-04-26: qty 2

## 2014-04-26 NOTE — ED Notes (Signed)
Patient now reports black coffee ground stools x 3 days.

## 2014-04-26 NOTE — H&P (Signed)
PULMONARY / CRITICAL CARE MEDICINE   Name: Kelli [name] MRN: [id number] DOB: January 06, 1951    ADMISSION DATE:  04/25/2014  REFERRING MD :  [facility]   CHIEF COMPLAINT:  GIB / Anemia  INITIAL PRESENTATION: 64 y/o F admitted 2/26 with weakness and large volume dark stools.  Work up notable for Hgb of 4.4.  Patient was transfused 4 units PRBC's and transferred to [facility] ICU for further care.    STUDIES:  2/26  CXR >> mild hyperinflation, mild atx LLL  SIGNIFICANT EVENTS: 2/26  Admit from [facility] with weakness, GIB, Hgb 4.4    HISTORY OF PRESENT ILLNESS:  64 y/o F, former smoker (40 years x 1 ppd) with a PMH of HTN, HLD, CAD, CKD III, borderline DM, urinary incontinence, closed L humerus fracture and CVA x2 on ASA / plavix (09/2012, L cerebellar artery with residual lower extremity / L arm weakness who presented to [facility] on 2/26 with a 2-3 week history of generalized weakness, decreased appetite, dry heaves, moist cough but no sputum production and 3 episodes of large volume dark stools.  She reported she took 2 Aleve tablets several days prior to admission.  The patient indicates mid-epigastric tenderness, occasional reflux symptoms, hoarse voice, urinary frequency and urgency.  She denies fevers, chills, sputum production, vomiting, hematemesis, weight loss, syncope.    At baseline, the patient is wheelchair bound due to prior stroke.  She recently as gained sensation back in her lower extremities, the ability to move them and has been participating in rehab.  She sits in her chair most of the day with her dog.    RN notes that the patients prior readings of low BP were recorded on her leg.  BP cuff moved to her arm and pressures were within normal limits.    PAST MEDICAL HISTORY :   has a past medical history of Essential hypertension, benign; Fibromyalgia; Cerebral infarction (Aug 2014); Mixed hyperlipidemia; Borderline diabetes; Urinary incontinence; Carotid artery occlusion; CKD (chronic kidney disease)  stage 3, GFR 30-59 ml/min; Cerebral infarction involving left cerebellar artery (Feb 2015); and Closed dislocation of left humerus (07/26/2013).  has past surgical history that includes Urethral dilation (1980's); Combined hysterectomy vaginal w/ MMK / A&P repair (1981); Vaginal hysterectomy (1981); Endarterectomy (Left, 10/06/2012); Loop recorder implant (04/16/13); and [name] without cardioversion (N/A, 04/16/2013).   HOME MEDICATIONS:  Prior to Admission medications   Medication Sig Start Date End Date Taking? Authorizing Provider  amLODipine (NORVASC) 10 MG tablet Take 10 mg by mouth daily.   Yes Historical Provider, MD  aspirin 325 MG tablet Take 325 mg by mouth daily.   Yes Historical Provider, MD  clopidogrel (PLAVIX) 75 MG tablet Take 1 tablet (75 mg total) by mouth daily with breakfast. 05/07/13  Yes Ivan Anchors Love, PA-C  hydrALAZINE (APRESOLINE) 50 MG tablet Take 1 tablet (50 mg total) by mouth 4 (four) times daily. 05/07/13  Yes Ivan Anchors Love, PA-C  naproxen sodium (ANAPROX) 220 MG tablet Take by mouth.   Yes Historical Provider, MD  simvastatin (ZOCOR) 10 MG tablet Take 1 tablet (10 mg total) by mouth daily at 6 PM. 10/19/12  Yes Lavon Paganini Angiulli, PA-C  tolterodine (DETROL LA) 4 MG 24 hr capsule Take 4 mg by mouth daily.   Yes Historical Provider, MD  venlafaxine XR (EFFEXOR-XR) 150 MG 24 hr capsule Take 150 mg by mouth daily with breakfast.   Yes Historical Provider, MD  acetaminophen (TYLENOL) 325 MG tablet Take 1-2 tablets (325-650 mg total)  by mouth every 4 (four) hours as needed for mild pain. 05/07/13   Ivan Anchors Love, PA-C  atenolol (TENORMIN) 25 MG tablet Take 25 mg by mouth daily.  06/16/13   Historical Provider, MD  clonazePAM (KLONOPIN) 0.5 MG tablet Take 1/2 tablet to (2) half tablets by mouth twice daily as needed for anxiety. Note half tablets 05/09/13   Lauree Chandler, NP  cloNIDine (CATAPRES - DOSED IN MG/24 HR) 0.3 mg/24hr patch Place 0.3 mg onto the skin once a week. On Tuesday     Historical Provider, MD  diltiazem (CARDIZEM CD) 360 MG 24 hr capsule Take 360 mg by mouth daily.    Historical Provider, MD  diphenoxylate-atropine (LOMOTIL) 2.5-0.025 MG per tablet Take 1 tablet by mouth 4 (four) times daily as needed for diarrhea or loose stools. 07/01/13   [name], MD  DULoxetine (CYMBALTA) 20 MG capsule Take 40 mg by mouth daily.    Historical Provider, MD  famotidine (PEPCID) 20 MG tablet Take 1 tablet (20 mg total) by mouth 2 (two) times daily. 07/31/13   Radene Gunning, NP  gabapentin (NEURONTIN) 300 MG capsule Take 1 capsule (300 mg total) by mouth at bedtime. 05/07/13   Bary Leriche, PA-C  HYDROcodone-acetaminophen (NORCO/VICODIN) 5-325 MG per tablet Take one to two tablets by mouth every 4 hours as needed for moderate pain 05/14/13   Tiffany L Reed, DO  Lactobacillus [facility] ACIDOPHILUS) CAPS Take 1 capsule by mouth daily.    Historical Provider, MD  polyethylene glycol powder (GLYCOLAX/MIRALAX) powder Take 17 g by mouth daily as needed for mild constipation.  06/16/13   Historical Provider, MD  traZODone (DESYREL) 50 MG tablet Take 1 tablet (50 mg total) by mouth at bedtime as needed for sleep. 09/03/13   Tiffany L Reed, DO   Allergies  Allergen Reactions  . Hydrochlorothiazide Nausea And Vomiting    FAMILY HISTORY:  indicated that her mother is deceased. She indicated that her father is deceased. She indicated that only one of her two sisters is alive. She indicated that her brother is alive. She indicated that all of her three sons are alive.    SOCIAL HISTORY:  reports that she quit smoking about 18 months ago. Her smoking use included Cigarettes. She has a 44 pack-year smoking history. She has never used smokeless tobacco. She reports that she does not drink alcohol or use illicit drugs.  REVIEW OF SYSTEMS:   Gen: Denies fever, chills, weight change, fatigue, night sweats HEENT: Denies blurred vision, double vision, hearing loss, tinnitus, sinus congestion,  rhinorrhea, sore throat, neck stiffness, dysphagia PULM: Denies shortness of breath, sputum production, hemoptysis, wheezing.  Reports moist but non-productive cough CV: Denies chest pain, edema, orthopnea, paroxysmal nocturnal dyspnea, palpitations GI: Denies vomiting, diarrhea, hematochezia, change in bowel habits.  Reports mid-epigastric tenderness, melena, constipation, nausea GU: Denies dysuria, hematuria, polyuria, oliguria, urethral discharge Endocrine: Denies hot or cold intolerance, polyuria, polyphagia or appetite change Derm: Denies rash, dry skin, scaling or peeling skin change Heme: Denies easy bruising, bleeding, bleeding gums Neuro: Denies headache, numbness, weakness, slurred speech, loss of memory or consciousness   SUBJECTIVE:   VITAL SIGNS: Temp:  [97.9 F (36.6 C)-98.8 F (37.1 C)] 97.9 F (36.6 C) (02/26 1430) Pulse Rate:  [90-134] 90 (02/26 1500) Resp:  [11-30] 18 (02/26 1500) BP: (61-165)/(19-91) 131/57 mmHg (02/26 1500) SpO2:  [95 %-100 %] 100 % (02/26 1500) FiO2 (%):  [28 %] 28 % (02/26 1115) Weight:  [185 lb (83.915  kg)-217 lb 2.5 oz (98.5 kg)] 217 lb 2.5 oz (98.5 kg) (02/26 0440)   HEMODYNAMICS:     VENTILATOR SETTINGS: Vent Mode:  [-]  FiO2 (%):  [28 %] 28 %   INTAKE / OUTPUT:  Intake/Output Summary (Last 24 hours) at 04/26/14 1515 Last data filed at 04/26/14 1500  Gross per 24 hour  Intake 3266.75 ml  Output     20 ml  Net 3246.75 ml    PHYSICAL EXAMINATION: General:  Obese female in NAD Neuro:  AAOx4, sluggish but appropriate HEENT:  Mm pink/moist, short thick neck Cardiovascular:  s1s2 rrr, no m/r/g Lungs:  resp's even/non-labored, lungs bilaterally with rhonchi that clears with cough Abdomen:  Obese, soft, bsx4 active  Musculoskeletal:  No acute deformities, LUE weakness, BLE weakness 3-4/5 Skin:  No acute defomities  LABS:  CBC  Recent Labs Lab 04/25/14 2347 04/26/14 0234  WBC 17.7* 18.3*  HGB 4.9* 4.4*  HCT 14.9* 13.6*   PLT 355 342   Coag's No results for input(s): APTT, INR in the last 168 hours.   BMET  Recent Labs Lab 04/25/14 2347  NA 134*  K 3.3*  CL 105  CO2 22  BUN 40*  CREATININE 1.50*  GLUCOSE 225*   Electrolytes  Recent Labs Lab 04/25/14 2347  CALCIUM 7.8*   Sepsis Markers  Recent Labs Lab 04/26/14 0225  LATICACIDVEN 1.26   ABG No results for input(s): PHART, PCO2ART, PO2ART in the last 168 hours.   Liver Enzymes No results for input(s): AST, ALT, ALKPHOS, BILITOT, ALBUMIN in the last 168 hours.   Cardiac Enzymes  Recent Labs Lab 04/25/14 2347  TROPONINI 0.03   Glucose No results for input(s): GLUCAP in the last 168 hours.  Imaging No results found.   ASSESSMENT / PLAN:  GASTROINTESTINAL / GU A:   GIB - suspect upper with melena, Aleve usage (small dose) GERD - subjective report Nausea  Stress Incontinence  P:   GI Consult, appreciate input Likely will need upper endoscopy Protonix gtt NPO x ice chips  HOLD Plavix / Asa PRN zofran  Hold detrol  PULMONARY OETT A: Hx Tobacco Abuse  P:   Oxygen to support sats 90-95% Pulmonary hygiene   CARDIOVASCULAR CVL A:  Hypotension - volume depletion, responded to PRBC's.  Lactic acid reassuring  CAD HTN HLD  P:  Volume with PRBC's See HEME  ICU monitoring  Hold home medications:  Norvasc, tenormin, diltiazem, hydralazine Continue clonidine patch for now Assess EKG, troponin  NS @ 50 ml/hr  RENAL A:   Acute on Chronic CKD III - baseline sr cr ~1.25 Hypokalemia   P:   Trend BMP Replace electrolytes as indicated   HEMATOLOGIC A:   Anemia - chronic disease + GI losses P:  Trend CBC, pending post 4 units PRBC's Tx for Hgb <7% GI Consult pending  Hold ASA, Plavix  SCD's for DVT prophylaxis   INFECTIOUS A:   Subjective Sx UTI - neg UA  P:   UC 2/26 >>   Rocephin x 1 at [facility], hold further abx & monitor   ENDOCRINE A:   Hyperglycemia - borderline DM    P:   SSI if  consistently > 180  NEUROLOGIC A: Hx CVA with residual LUE, BLE weakness   Fibromyalgia  Depression / Anxiety  P:   Minimize sedating medications as able  25 mcg Fentanyl Q2 PRN  Push PT efforts once stable from GI standpoint Hold effexor   FAMILY  - Updates:  Husband updated at bedside    Noe Gens, NP-C [place] Pgr: [phone] or [phone] 04/26/2014, 3:15 PM   Attending Note:  I have examined patient, reviewed labs, studies and notes. I have discussed the case with B Ollis, and I agree with the data and plans as amended above. Kelli [name] has a hx HTN, CAD, CVA. She has been feeling weaker for a few weeks, is now admitted with melena and hemorrhagic shock. She is hemodynamically stable currently off her usual BP meds. We will treat her with PPI, consult GI for assistance with EGD. Follow in the ICU until the EGD identifies / characterizes bleed source.   Baltazar Apo, MD, PhD 04/26/2014, 4:00 PM [place] [facility] [phone] or if no answer [phone]

## 2014-04-26 NOTE — Progress Notes (Signed)
Patient seen and examined. Admitted earlier today with melena. Is on ASA/Plavix chronically and had been taking Aleve. Found to have a Hb of 4.4 and SBP in the 70s-80s. Has received 2 units of PRBCs and 3 L of IVF so far. Fourth liter being infused and to receive 2 more units of PRBCs, Current SBP 88. Unfortunately, there is no GI coverage at [facility] this weekend and as such, will need transfer to [facility]. Given her hypovolemic shock, have requested transfer to CCM service under the care of Dr. Halford Chessman. [facility] will be notified of the transfer. Will continue to follow patient while she remains at [facility].  Domingo Mend, MD [facility] Pager: [phone]

## 2014-04-26 NOTE — H&P (View-Only) (Signed)
[facility] Gastroenterology Consultation Note  Referring Provider: Dr. Baltazar Apo (PCCM) Primary Care Physician:  Manon Hilding, MD  Reason for Consultation:  Melena, anemia  HPI: Kelli [name] is a 64 y.o. female whom we've been asked to see for melena and anemia.  Patient has had progressive weakness and malaise for a couple weeks, and started having some melena yesterday.  Has some periumbilical and lower abdominal discomfort.  She has no hematemesis, but reports history of chronic retching (well predating her current symptoms).  Takes aspirin and clopidigrel chronically for stroke, and also took a couple naproxen recently.  Last dose of clopidigrel yesterday morning.  Denies prior GI bleeding and denies prior endoscopy.  Had colonoscopy several years ago for routine screening, normal per patient.  Presented to [facility], and was found to have systolic blood pressure in 70-80s and had Hemoglobin of 4.  Has received 4 units packed RBCs at [facility] before her transfer here to [facility].  No episodes of melena or hematemesis since arrival.   Past Medical History  Diagnosis Date  . Essential hypertension, benign   . Fibromyalgia   . Cerebral infarction Aug 2014    Bihemispheric watershed infarcts  . Mixed hyperlipidemia   . Borderline diabetes   . Urinary incontinence   . Carotid artery occlusion     Occluded RICA, status post left CEA  August 2014 - Dr. Donnetta Hutching  . CKD (chronic kidney disease) stage 3, GFR 30-59 ml/min   . 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 Closed dislocation of left humerus 07/26/2013    Past Surgical History  Procedure Laterality Date  . Urethral dilation  1980's  . Combined hysterectomy vaginal w/ mmk / a&p repair  1981  . Vaginal hysterectomy  1981    "partial" (10/04/2012)  . Endarterectomy Left 10/06/2012    Procedure: Carotid Endarterectomy with Finesse patch angioplasty;  Surgeon: [name], MD;  Location: [place];  Service: Vascular;   Laterality: Left;  . Loop recorder implant  04/16/13    MDT LinQ implanted for cryptogenic stroke  . [name] without cardioversion N/A 04/16/2013    Procedure: TRANSESOPHAGEAL ECHOCARDIOGRAM ([name]);  Surgeon: Josue Hector, MD;  Location: [facility] ENDOSCOPY;  Service: Cardiovascular;  Laterality: N/A;    Prior to Admission medications   Medication Sig Start Date End Date Taking? Authorizing Provider  amLODipine (NORVASC) 10 MG tablet Take 10 mg by mouth daily.   Yes Historical Provider, MD  aspirin 325 MG tablet Take 325 mg by mouth daily.   Yes Historical Provider, MD  clopidogrel (PLAVIX) 75 MG tablet Take 1 tablet (75 mg total) by mouth daily with breakfast. 05/07/13  Yes Ivan Anchors Love, PA-C  hydrALAZINE (APRESOLINE) 50 MG tablet Take 1 tablet (50 mg total) by mouth 4 (four) times daily. 05/07/13  Yes Ivan Anchors Love, PA-C  naproxen sodium (ANAPROX) 220 MG tablet Take by mouth.   Yes Historical Provider, MD  simvastatin (ZOCOR) 10 MG tablet Take 1 tablet (10 mg total) by mouth daily at 6 PM. 10/19/12  Yes Lavon Paganini Angiulli, PA-C  tolterodine (DETROL LA) 4 MG 24 hr capsule Take 4 mg by mouth daily.   Yes Historical Provider, MD  venlafaxine XR (EFFEXOR-XR) 150 MG 24 hr capsule Take 150 mg by mouth daily with breakfast.   Yes Historical Provider, MD  acetaminophen (TYLENOL) 325 MG tablet Take 1-2 tablets (325-650 mg total) by mouth every 4 (four) hours as needed for mild pain. 05/07/13  Ivan Anchors Love, PA-C  atenolol (TENORMIN) 25 MG tablet Take 25 mg by mouth daily.  06/16/13   Historical Provider, MD  clonazePAM (KLONOPIN) 0.5 MG tablet Take 1/2 tablet to (2) half tablets by mouth twice daily as needed for anxiety. Note half tablets 05/09/13   Lauree Chandler, NP  cloNIDine (CATAPRES - DOSED IN MG/24 HR) 0.3 mg/24hr patch Place 0.3 mg onto the skin once a week. On Tuesday    Historical Provider, MD  diltiazem (CARDIZEM CD) 360 MG 24 hr capsule Take 360 mg by mouth daily.    Historical Provider, MD   diphenoxylate-atropine (LOMOTIL) 2.5-0.025 MG per tablet Take 1 tablet by mouth 4 (four) times daily as needed for diarrhea or loose stools. 07/01/13   [name], MD  DULoxetine (CYMBALTA) 20 MG capsule Take 40 mg by mouth daily.    Historical Provider, MD  famotidine (PEPCID) 20 MG tablet Take 1 tablet (20 mg total) by mouth 2 (two) times daily. 07/31/13   Radene Gunning, NP  gabapentin (NEURONTIN) 300 MG capsule Take 1 capsule (300 mg total) by mouth at bedtime. 05/07/13   Bary Leriche, PA-C  HYDROcodone-acetaminophen (NORCO/VICODIN) 5-325 MG per tablet Take one to two tablets by mouth every 4 hours as needed for moderate pain 05/14/13   Tiffany L Reed, DO  Lactobacillus [facility] ACIDOPHILUS) CAPS Take 1 capsule by mouth daily.    Historical Provider, MD  polyethylene glycol powder (GLYCOLAX/MIRALAX) powder Take 17 g by mouth daily as needed for mild constipation.  06/16/13   Historical Provider, MD  traZODone (DESYREL) 50 MG tablet Take 1 tablet (50 mg total) by mouth at bedtime as needed for sleep. 09/03/13   Tiffany L Reed, DO    Current Facility-Administered Medications  Medication Dose Route Frequency Provider Last Rate Last Dose  . 0.9 %  sodium chloride infusion   Intravenous Continuous Shanda Howells, MD 50 mL/hr at 04/26/14 0941    . 0.9 %  sodium chloride infusion   Intravenous Once Erline Hau, MD   10 mL at 04/26/14 0943  . acetaminophen (TYLENOL) tablet 650 mg  650 mg Oral Once Delora Fuel, MD   [id number] mg at 04/26/14 0240  . fentaNYL (SUBLIMAZE) injection 25 mcg  25 mcg Intravenous Q2H PRN Donita Brooks, NP      . ondansetron (ZOFRAN) injection 4 mg  4 mg Intravenous 4 times per day Shanda Howells, MD   4 mg at 04/26/14 0323  . pantoprazole (PROTONIX) 80 mg in sodium chloride 0.9 % 250 mL (0.32 mg/mL) infusion  8 mg/hr Intravenous Continuous Donita Brooks, NP 25 mL/hr at 04/26/14 1606 8 mg/hr at 04/26/14 1606  . [START ON 04/30/2014] pantoprazole (PROTONIX) injection 40 mg  40 mg  Intravenous Q12H Brandi L Ollis, NP      . sodium chloride 0.9 % injection 3 mL  3 mL Intravenous Q12H Shanda Howells, MD   3 mL at 04/26/14 0941    Allergies as of 04/25/2014 - Review Complete 04/25/2014  Allergen Reaction Noted  . Hydrochlorothiazide Nausea And Vomiting 10/03/2012    Family History  Problem Relation Age of Onset  . Diabetes Mother   . Diabetes Father   . Hypertension Sister   . Diabetes Brother   . Seizures Son   . Kidney disease Maternal Grandmother   . Hypertension Maternal Grandmother   . Heart disease Maternal Grandfather   . Diabetes Paternal Grandmother   . Heart disease Paternal Grandfather  History   Social History  . Marital Status: Married    Spouse Name: N/A  . Number of Children: N/A  . Years of Education: N/A   Occupational History  . Not on file.   Social History Main Topics  . Smoking status: Former Smoker -- 1.00 packs/day for 44 years    Types: Cigarettes    Quit date: 10/03/2012  . Smokeless tobacco: Never Used  . Alcohol Use: No  . Drug Use: No  . Sexual Activity: Yes    Birth Control/ Protection: Surgical   Other Topics Concern  . Not on file   Social History Narrative    Review of Systems: Positive = bold Gen: Denies any fever, chills, rigors, night sweats, anorexia, fatigue, weakness, malaise, involuntary weight loss, and sleep disorder CV: Denies chest pain, angina, palpitations, syncope, orthopnea, PND, peripheral edema, and claudication. Resp: Denies dyspnea, cough, sputum, wheezing, coughing up blood. GI: Described in detail in HPI.    GU : Denies urinary burning, blood in urine, urinary frequency, urinary hesitancy, nocturnal urination, and urinary incontinence. MS: Denies joint pain or swelling.  Denies muscle weakness, cramps, atrophy.  Derm: Denies rash, itching, oral ulcerations, hives, unhealing ulcers.  Psych: Denies depression, anxiety, memory loss, suicidal ideation, hallucinations,  and  confusion. Heme: Denies bruising, bleeding, and enlarged lymph nodes. Neuro:  Denies any headaches, dizziness, paresthesias. Endo:  Denies any problems with DM, thyroid, adrenal function.  Physical Exam: Vital signs in last 24 hours: Temp:  [97.9 F (36.6 C)-98.8 F (37.1 C)] 97.9 F (36.6 C) (02/26 1430) Pulse Rate:  [90-134] 95 (02/26 1600) Resp:  [11-30] 14 (02/26 1600) BP: (61-165)/(19-91) 120/73 mmHg (02/26 1600) SpO2:  [95 %-100 %] 97 % (02/26 1600) FiO2 (%):  [28 %] 28 % (02/26 1115) Weight:  [83.915 kg (185 lb)-98.5 kg (217 lb 2.5 oz)] 98.5 kg (217 lb 2.5 oz) (02/26 0440) Last BM Date: 04/25/14 General:   Alert,  Well-developed, well-nourished, pleasant and cooperative in NAD Head:  Normocephalic and atraumatic. Eyes:  Sclera clear, no icterus.   Conjunctiva pink. Ears:  Normal auditory acuity. Nose:  No deformity, discharge,  or lesions. Mouth:  No deformity or lesions.  Oropharynx pink & moist. Neck:  Supple; no masses or thyromegaly. Lungs:  Clear throughout to auscultation.   No wheezes, crackles, or rhonchi. No acute distress. Heart:  Regular rate and rhythm; no murmurs, clicks, rubs,  or gallops. Abdomen:  Soft, nontender and nondistended. No masses, hepatosplenomegaly or hernias noted. Normal bowel sounds, without guarding, and without rebound.     Msk:  Symmetrical without gross deformities. Normal posture. Pulses:  Normal pulses noted. Extremities:  Without clubbing or edema. Neurologic:  Alert and  oriented x4;  grossly normal neurologically. Skin:  Intact without significant lesions or rashes. Cervical Nodes:  No significant cervical adenopathy. Psych:  Alert and cooperative. Normal mood and affect.   Lab Results:  Recent Labs  04/25/14 2347 04/26/14 0234  WBC 17.7* 18.3*  HGB 4.9* 4.4*  HCT 14.9* 13.6*  PLT 355 342   BMET  Recent Labs  04/25/14 2347  NA 134*  K 3.3*  CL 105  CO2 22  GLUCOSE 225*  BUN 40*  CREATININE 1.50*  CALCIUM 7.8*    LFT No results for input(s): PROT, ALBUMIN, AST, ALT, ALKPHOS, BILITOT, BILIDIR, IBILI in the last 72 hours. PT/INR No results for input(s): LABPROT, INR in the last 72 hours.  Studies/Results: Dg Chest 2 View  04/26/2014   CLINICAL DATA:  Nausea, vomiting, and generalized weakness.  EXAM: CHEST  2 VIEW  COMPARISON:  Radiographs 07/30/2013. CT 04/17/2013. Left shoulder series 07/26/13  FINDINGS: Mild hyperinflation. Borderline cardiomegaly again seen. There is minimal blunting of left costophrenic angle without frank pleural effusion. Pulmonary vasculature is normal. No consolidation or pneumothorax. No acute osseous abnormalities are seen. Pseudosubluxation of the left shoulder is unchanged from prior exams. The bones appear under mineralized.  IMPRESSION: Mild hyperinflation and borderline cardiomegaly. Minimal blunting of left costophrenic angle is likely subsegmental atelectasis.   Electronically Signed   By: Jeb Levering M.D.   On: 04/26/2014 01:16   Impression:  1.  Melena, in setting of aspirin and clopidigrel.  Suspect ulcer.  Had been hypotensive and tachycardic, but has responded well with volume (fluids and blood) resuscitation. 2.  Anemia.  Suspect patient might have had some smoldering bleeding for the past week or two, but bleeding has picked up over the past few days. 3.  History stroke and multiple medical problems.  Plan:  1.  Protonix drip. 2.  Stat post-transfusion CBC. 3.  Hold aspirin and clopidigrel and any other blood thinners or anticoagulants. 4.  Endoscopy later today, after confirmation of increased post-transfusion hemoglobin. 5.  Risks (bleeding, infection, bowel perforation that could require surgery, sedation-related changes in cardiopulmonary systems), benefits (identification and possible treatment of source of symptoms, exclusion of certain causes of symptoms), and alternatives (watchful waiting, radiographic imaging studies, empiric medical treatment) of  upper endoscopy (EGD) were explained to patient/family in detail and patient wishes to proceed.   LOS: 0 days   Christell Steinmiller M  04/26/2014, 4:21 PM

## 2014-04-26 NOTE — Progress Notes (Signed)
Called report to RN nurse at [facility] 2s at [facility] that is going to get Kelli [name]. Patient is being transported with blood still flowing and there was a discrepant BP of 89 systolic because we had cuff on left leg. Cuff changed to right arm and BP now 0000000 systolic. Transport transported with no difficulties.

## 2014-04-26 NOTE — Op Note (Signed)
[facility] [place], [id number]   ENDOSCOPY PROCEDURE REPORT  PATIENT: Kelli [name], Kelli [name]  MR#: [id number] BIRTHDATE: January 29, 1951 , 63  yrs. old GENDER: female ENDOSCOPIST: Wilford Corner, MD REFERRED BY: PROCEDURE DATE:  2014/05/07 PROCEDURE:  EGD, diagnostic ASA CLASS:     Class III INDICATIONS:  melena and acute post hemorrhagic anemia. MEDICATIONS: Fentanyl 50 mcg IV and Versed 5 mg IV TOPICAL ANESTHETIC: Cetacaine Spray  DESCRIPTION OF PROCEDURE: After the risks benefits and alternatives of the procedure were thoroughly explained, informed consent was obtained.  The Pentax Gastroscope [id number] endoscope was introduced through the mouth and advanced to the second portion of the duodenum , Without limitations.  The instrument was slowly withdrawn as the mucosa was fully examined.    GEJ 36 cm from the incisors. A large superficial clean-based ulcer with surrounding edema noted at the GEJ. Clear fluid noted in stomach. Minimal longitudinal erythematous streaks noted in antrum. Stomach body normal in appearance. Two small (40mm; 16mm) clean-based ulcers noted in the distal duodenal bulb with surrounding edema and erythema. Second portion of the duodenum normal in appearance. Small hiatal hernia noted on retroflexion. Small clean-based superficial ulcer seen in the cardia of the stomach.          The scope was then withdrawn from the patient and the procedure completed.  COMPLICATIONS: There were no immediate complications.  ENDOSCOPIC IMPRESSION:     Several clean-based duodenal ulcers, esophageal ulcers, and stomach ulcer (see above) Distal erosive esophagitis Hiatal hernia Minimal antral gastritis No active bleeding  RECOMMENDATIONS:     Check H. pylori serology and treat if positive Continue Protonix infusion; Ice chips today and if ok then clear liquids tomorrow Follow H/Hs   eSigned:  Wilford Corner, MD May 07, 2014 7:45  PM    CC:  CPT CODES: ICD CODES:  The ICD and CPT codes recommended by this software are interpretations from the data that the clinical staff has captured with the software.  The verification of the translation of this report to the ICD and CPT codes and modifiers is the sole responsibility of the health care institution and practicing physician where this report was generated.  [employer] will not be held responsible for the validity of the ICD and CPT codes included on this report.  AMA assumes no liability for data contained or not contained herein. CPT is a Designer, television/film set of the [employer].  PATIENT NAME:  Arias, Barmes MR#: [id number]

## 2014-04-26 NOTE — ED Notes (Signed)
CRITICAL VALUE ALERT  Critical value received:  Hemoglobin 4.9  Date of notification:  04/26/2014  Time of notification:  [id number]  Critical value read back:Yes.    Nurse who received alert:  Lucy Antigua, RN  Dr. Roxanne Mins notified [id number]

## 2014-04-26 NOTE — H&P (Signed)
Hospitalist Admission History and Physical  Patient name: Kelli [name] Medical record number: [id number] Date of birth: [birth year] Age: 64 y.o. Gender: female  Primary Care Provider: Manon Hilding, MD  Chief Complaint: GIB, acute blood loss anemia   History of Present Illness:This is a 64 y.o. year old female with past medical history of multiple medical problems including CKD, CVA, HTN  presenting with GIB. Pt/husband report pt w/ decreased appetite, black stools, nausea, dry heaves over past 2 days. Pt states that she has had chronic diarrhea, nasuea over an extended period of time. On ASA and plavix chronically. Pt states that she took aleve for pain 2 days ago. Black stools started after this per husband. Denies fevers, chills. + mild dysuria, increased urinary frequency.  Present to ER afebrile, HR 110s-130s, resp 10s-20s, BP 100s-160s. Satting 100% on RA. WBC 17.7, hgb 4.9, K 3.3., Cr 1.5, BUN 40. Glu 225. CXR w/ hyperinflation and borderline cardiomegaly. Lactate 1.26. UA mildly indicative of infection w/ dysuria   Assessment and Plan: Kelli [name] is a 64 y.o. year old female presenting with GIB, acute blood loss anemia   Active Problems:   GIB (gastrointestinal bleeding)   1- GIB -upper vs lower GI source -suspect NSAID induced  -Hemoccult positive  -type and screen -protonix gtt -HOLD offending agents -NPO -GI c/s in am  -stepdown   2-Anemia -acute blood loss -hgb 4.9 on admission -type and cross -serial CBCs -hold offending agents   3-UTI -UA mildly indicative of infection on micro  -+ dysuria clinically per pt  -noted leukocytosis-likely reactive in setting of above -IV rocephin -urine culture  4-Hx/o CVA -chronic hemideficits per husband  -stable -HOLD ASA and plavix given above -follow   5-HTN -elevated on presentation -holding orals  -cont clonidine -prn hydralazine  6-AKI  -mild worsening in setting of above -pending pRBC transfusion   -follow creatinine    FEN/GI: NPO. protonix gtt Prophylaxis: SCDs  Disposition: pending further evaluation  Code Status:Full Code    Patient Active Problem List   Diagnosis Date Noted  . GIB (gastrointestinal bleeding) 04/26/2014  . Leukocytosis, unspecified 07/30/2013  . Thrombocytosis 07/30/2013  . Subluxation of left shoulder joint 07/27/2013  . CKD (chronic kidney disease) stage 2, GFR 60-89 ml/min 07/27/2013  . Closed dislocation of left humerus 07/26/2013  . Pyelonephritis 07/25/2013  . Acute on chronic renal failure 07/25/2013  . Hyperglycemia 07/25/2013  . Anemia 07/25/2013  . Hyponatremia 07/25/2013  . Spastic hemiplegia affecting nondominant side 07/06/2013  . 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 UTI (urinary tract infection) 06/15/2013  . Edema 05/28/2013  . Knee pain 05/28/2013  . Carotid stenosis, bilateral 05/22/2013  . Fall at nursing home 05/20/2013  . Hip pain 05/20/2013  . Pain in joint, lower leg 05/19/2013  . Chest congestion 05/12/2013  . E. coli UTI 05/07/2013  . Left shoulder pain due to subluxation and/or adhesive capsulitis.  05/07/2013  . Acute CVA (cerebrovascular accident): R PCA infarct per MRI 04/13/13 04/13/2013  . CVA (cerebral infarction) 04/08/2013  . Acute ischemic stroke 04/08/2013  . Diplopia 04/08/2013  . Left-sided weakness 04/08/2013  . Vertigo 04/08/2013  . Paresthesias in left hand 04/08/2013  . Pre-diabetes 04/08/2013  . Cerebellar stroke 04/08/2013  . Dizziness and giddiness 04/08/2013  . Stroke 04/08/2013  . Depression 04/06/2013  . Peripheral arterial disease 03/16/2013  . History of stroke 01/30/2013  . Precordial pain 01/16/2013  . CKD (chronic kidney disease) stage 3,  GFR 30-59 ml/min 01/16/2013  . Anxiety state, unspecified 12/08/2012  . Lumbago 12/08/2012  . Urinary frequency 11/14/2012  . Occlusion and stenosis of carotid artery with cerebral infarction 10/05/2012   . Mixed hyperlipidemia 10/05/2012  . Renal insufficiency 10/05/2012  . HTN (hypertension) 10/04/2012  . Tobacco abuse 10/04/2012  . Obesity 10/04/2012   Past Medical History: Past Medical History  Diagnosis Date  . Essential hypertension, benign   . Fibromyalgia   . Cerebral infarction Aug 2014    Bihemispheric watershed infarcts  . Mixed hyperlipidemia   . Borderline diabetes   . Urinary incontinence   . Carotid artery occlusion     Occluded RICA, status post left CEA  August 2014 - Dr. Donnetta Hutching  . CKD (chronic kidney disease) stage 3, GFR 30-59 ml/min   . 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 Closed dislocation of left humerus 07/26/2013    Past Surgical History: Past Surgical History  Procedure Laterality Date  . Urethral dilation  1980's  . Combined hysterectomy vaginal w/ mmk / a&p repair  1981  . Vaginal hysterectomy  1981    "partial" (10/04/2012)  . Endarterectomy Left 10/06/2012    Procedure: Carotid Endarterectomy with Finesse patch angioplasty;  Surgeon: [name], MD;  Location: [facility];  Service: Vascular;  Laterality: Left;  . Loop recorder implant  04/16/13    MDT LinQ implanted for cryptogenic stroke  . [name] without cardioversion N/A 04/16/2013    Procedure: TRANSESOPHAGEAL ECHOCARDIOGRAM ([name]);  Surgeon: Josue Hector, MD;  Location: [facility] ENDOSCOPY;  Service: Cardiovascular;  Laterality: N/A;    Social History: History   Social History  . Marital Status: Married    Spouse Name: N/A  . Number of Children: N/A  . Years of Education: N/A   Social History Main Topics  . Smoking status: Former Smoker -- 1.00 packs/day for 44 years    Types: Cigarettes    Quit date: 10/03/2012  . Smokeless tobacco: Never Used  . Alcohol Use: No  . Drug Use: No  . Sexual Activity: Yes    Birth Control/ Protection: Surgical   Other Topics Concern  . None   Social History Narrative    Family History: Family History  Problem Relation Age of  Onset  . Diabetes Mother   . Diabetes Father   . Hypertension Sister   . Diabetes Brother   . Seizures Son   . Kidney disease Maternal Grandmother   . Hypertension Maternal Grandmother   . Heart disease Maternal Grandfather   . Diabetes Paternal Grandmother   . Heart disease Paternal Grandfather     Allergies: Allergies  Allergen Reactions  . Hydrochlorothiazide Nausea And Vomiting    Current Facility-Administered Medications  Medication Dose Route Frequency Provider Last Rate Last Dose  . 0.9 %  sodium chloride infusion   Intravenous Continuous Shanda Howells, MD      . acetaminophen (TYLENOL) tablet 650 mg  650 mg Oral Once Delora Fuel, MD   [id number] mg at 04/26/14 0240  . pantoprazole (PROTONIX) 80 mg in sodium chloride 0.9 % 250 mL (0.32 mg/mL) infusion  8 mg/hr Intravenous Continuous Delora Fuel, MD 25 mL/hr at 04/26/14 0228 8 mg/hr at 04/26/14 0228  . [START ON 04/29/2014] pantoprazole (PROTONIX) injection 40 mg  40 mg Intravenous [id number] Delora Fuel, MD      . sodium chloride 0.9 % injection 3 mL  3 mL Intravenous Q12H Shanda Howells, MD       Current  Outpatient Prescriptions  Medication Sig Dispense Refill  . amLODipine (NORVASC) 10 MG tablet Take 10 mg by mouth daily.    Marland Kitchen aspirin 325 MG tablet Take 325 mg by mouth daily.    . clopidogrel (PLAVIX) 75 MG tablet Take 1 tablet (75 mg total) by mouth daily with breakfast.    . hydrALAZINE (APRESOLINE) 50 MG tablet Take 1 tablet (50 mg total) by mouth 4 (four) times daily. 270 tablet 3  . simvastatin (ZOCOR) 10 MG tablet Take 1 tablet (10 mg total) by mouth daily at 6 PM. 30 tablet 1  . tolterodine (DETROL LA) 4 MG 24 hr capsule Take 4 mg by mouth daily.    Marland Kitchen venlafaxine XR (EFFEXOR-XR) 150 MG 24 hr capsule Take 150 mg by mouth daily with breakfast.    . acetaminophen (TYLENOL) 325 MG tablet Take 1-2 tablets (325-650 mg total) by mouth every 4 (four) hours as needed for mild pain.    Marland Kitchen atenolol (TENORMIN) 25 MG tablet Take 25 mg by  mouth daily.     . clonazePAM (KLONOPIN) 0.5 MG tablet Take 1/2 tablet to (2) half tablets by mouth twice daily as needed for anxiety. Note half tablets 30 tablet 5  . cloNIDine (CATAPRES - DOSED IN MG/24 HR) 0.3 mg/24hr patch Place 0.3 mg onto the skin once a week. On Tuesday    . diltiazem (CARDIZEM CD) 360 MG 24 hr capsule Take 360 mg by mouth daily.    . diphenoxylate-atropine (LOMOTIL) 2.5-0.025 MG per tablet Take 1 tablet by mouth 4 (four) times daily as needed for diarrhea or loose stools. 20 tablet 0  . DULoxetine (CYMBALTA) 20 MG capsule Take 40 mg by mouth daily.    . famotidine (PEPCID) 20 MG tablet Take 1 tablet (20 mg total) by mouth 2 (two) times daily. 30 tablet 0  . gabapentin (NEURONTIN) 300 MG capsule Take 1 capsule (300 mg total) by mouth at bedtime.    Marland Kitchen HYDROcodone-acetaminophen (NORCO/VICODIN) 5-325 MG per tablet Take one to two tablets by mouth every 4 hours as needed for moderate pain 360 tablet 0  . Lactobacillus (FLORAJEN ACIDOPHILUS) CAPS Take 1 capsule by mouth daily.    . polyethylene glycol powder (GLYCOLAX/MIRALAX) powder Take 17 g by mouth daily as needed for mild constipation.     . traZODone (DESYREL) 50 MG tablet Take 1 tablet (50 mg total) by mouth at bedtime as needed for sleep. 30 tablet 3   Review Of Systems: 12 point ROS negative except as noted above in HPI.  Physical Exam: Filed Vitals:   04/26/14 0149  BP: 165/66  Pulse: 118  Temp: 98.8 F (37.1 C)  Resp: 18    General: alert, cooperative and mildly obese HEENT: PERRLA and extra ocular movement intact Heart: S1, S2 normal, no murmur, rub or gallop, regular rate and rhythm Lungs: clear to auscultation, no wheezes or rales and unlabored breathing Abdomen: + bowel sounds, minimal-mild abd TTP  Extremities: extremities normal, atraumatic, no cyanosis or edema Skin:no rashes Neurology: normal without focal findings  Labs and Imaging: Lab Results  Component Value Date/Time   NA 134* 04/25/2014  11:47 PM   K 3.3* 04/25/2014 11:47 PM   CL 105 04/25/2014 11:47 PM   CO2 22 04/25/2014 11:47 PM   BUN 40* 04/25/2014 11:47 PM   CREATININE 1.50* 04/25/2014 11:47 PM   GLUCOSE 225* 04/25/2014 11:47 PM   Lab Results  Component Value Date   WBC 17.7* 04/25/2014   HGB 4.9* 04/25/2014  HCT 14.9* 04/25/2014   MCV 86.1 04/25/2014   PLT 355 04/25/2014    Dg Chest 2 View  04/26/2014   CLINICAL DATA:  Nausea, vomiting, and generalized weakness.  EXAM: CHEST  2 VIEW  COMPARISON:  Radiographs 07/30/2013. CT 04/17/2013. Left shoulder series 07/26/13  FINDINGS: Mild hyperinflation. Borderline cardiomegaly again seen. There is minimal blunting of left costophrenic angle without frank pleural effusion. Pulmonary vasculature is normal. No consolidation or pneumothorax. No acute osseous abnormalities are seen. Pseudosubluxation of the left shoulder is unchanged from prior exams. The bones appear under mineralized.  IMPRESSION: Mild hyperinflation and borderline cardiomegaly. Minimal blunting of left costophrenic angle is likely subsegmental atelectasis.   Electronically Signed   By: Jeb Levering M.D.   On: 04/26/2014 01:16           Shanda Howells MD  Pager: [phone]

## 2014-04-26 NOTE — ED Notes (Signed)
Hemmocult positive

## 2014-04-26 NOTE — Interval H&P Note (Signed)
History and Physical Interval Note:  04/26/2014 7:25 PM  Kelli [name]  has presented today for surgery, with the diagnosis of bleeding  The various methods of treatment have been discussed with the patient and family. After consideration of risks, benefits and other options for treatment, the patient has consented to  Procedure(s): ESOPHAGOGASTRODUODENOSCOPY (EGD) (N/A) as a surgical intervention .  The patient's history has been reviewed, patient examined, no change in status, stable for surgery.  I have reviewed the patient's chart and labs.  Questions were answered to the patient's satisfaction.     [place].

## 2014-04-26 NOTE — Care Management Utilization Note (Signed)
UR completed 

## 2014-04-26 NOTE — Progress Notes (Signed)
Inpatient Diabetes Program Recommendations  AACE/ADA: New Consensus Statement on Inpatient Glycemic Control (2013)  Target Ranges:  Prepandial:   less than 140 mg/dL      Peak postprandial:   less than 180 mg/dL (1-2 hours)      Critically ill patients:  140 - 180 mg/dL   Results for QUINISHA, CERMINARA (MRN [id number]) as of 04/26/2014 09:48  Ref. Range 04/25/2014 23:47  Glucose Latest Range: 70-99 mg/dL 225 (H)    Diabetes history: No documented history of DM; Noted in H&P that Hyperglycemia is listed in history Outpatient Diabetes medications: None Current orders for Inpatient glycemic control: None  Inpatient Diabetes Program Recommendations Correction (SSI): Please order CBGs with Novolog correction scale Q4H. HgbA1C: Please consider ordering an A1C to evaluate glycemic control over the past 2-3 months.  Thanks, Barnie Alderman, RN, MSN, CCRN, CDE Diabetes Coordinator Inpatient Diabetes Program [phone] (Team Pager) [phone] ([facility] office) [phone] [facility] office)

## 2014-04-26 NOTE — Consult Note (Signed)
[facility] Gastroenterology Consultation Note  Referring Provider: Dr. Baltazar Apo (PCCM) Primary Care Physician:  Manon Hilding, MD  Reason for Consultation:  Melena, anemia  HPI: Kelli [name] is a 64 y.o. female whom we've been asked to see for melena and anemia.  Patient has had progressive weakness and malaise for a couple weeks, and started having some melena yesterday.  Has some periumbilical and lower abdominal discomfort.  She has no hematemesis, but reports history of chronic retching (well predating her current symptoms).  Takes aspirin and clopidigrel chronically for stroke, and also took a couple naproxen recently.  Last dose of clopidigrel yesterday morning.  Denies prior GI bleeding and denies prior endoscopy.  Had colonoscopy several years ago for routine screening, normal per patient.  Presented to [facility], and was found to have systolic blood pressure in 70-80s and had Hemoglobin of 4.  Has received 4 units packed RBCs at [facility] before her transfer here to [facility].  No episodes of melena or hematemesis since arrival.   Past Medical History  Diagnosis Date  . Essential hypertension, benign   . Fibromyalgia   . Cerebral infarction Aug 2014    Bihemispheric watershed infarcts  . Mixed hyperlipidemia   . Borderline diabetes   . Urinary incontinence   . Carotid artery occlusion     Occluded RICA, status post left CEA  August 2014 - Dr. Donnetta Hutching  . CKD (chronic kidney disease) stage 3, GFR 30-59 ml/min   . 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 Closed dislocation of left humerus 07/26/2013    Past Surgical History  Procedure Laterality Date  . Urethral dilation  1980's  . Combined hysterectomy vaginal w/ mmk / a&p repair  1981  . Vaginal hysterectomy  1981    "partial" (10/04/2012)  . Endarterectomy Left 10/06/2012    Procedure: Carotid Endarterectomy with Finesse patch angioplasty;  Surgeon: [name], MD;  Location: [facility];  Service: Vascular;   Laterality: Left;  . Loop recorder implant  04/16/13    MDT LinQ implanted for cryptogenic stroke  . [name] without cardioversion N/A 04/16/2013    Procedure: TRANSESOPHAGEAL ECHOCARDIOGRAM ([name]);  Surgeon: Josue Hector, MD;  Location: [facility] ENDOSCOPY;  Service: Cardiovascular;  Laterality: N/A;    Prior to Admission medications   Medication Sig Start Date End Date Taking? Authorizing Provider  amLODipine (NORVASC) 10 MG tablet Take 10 mg by mouth daily.   Yes Historical Provider, MD  aspirin 325 MG tablet Take 325 mg by mouth daily.   Yes Historical Provider, MD  clopidogrel (PLAVIX) 75 MG tablet Take 1 tablet (75 mg total) by mouth daily with breakfast. 05/07/13  Yes Ivan Anchors Love, PA-C  hydrALAZINE (APRESOLINE) 50 MG tablet Take 1 tablet (50 mg total) by mouth 4 (four) times daily. 05/07/13  Yes Ivan Anchors Love, PA-C  naproxen sodium (ANAPROX) 220 MG tablet Take by mouth.   Yes Historical Provider, MD  simvastatin (ZOCOR) 10 MG tablet Take 1 tablet (10 mg total) by mouth daily at 6 PM. 10/19/12  Yes Lavon Paganini Angiulli, PA-C  tolterodine (DETROL LA) 4 MG 24 hr capsule Take 4 mg by mouth daily.   Yes Historical Provider, MD  venlafaxine XR (EFFEXOR-XR) 150 MG 24 hr capsule Take 150 mg by mouth daily with breakfast.   Yes Historical Provider, MD  acetaminophen (TYLENOL) 325 MG tablet Take 1-2 tablets (325-650 mg total) by mouth every 4 (four) hours as needed for mild pain. 05/07/13  Ivan Anchors Love, PA-C  atenolol (TENORMIN) 25 MG tablet Take 25 mg by mouth daily.  06/16/13   Historical Provider, MD  clonazePAM (KLONOPIN) 0.5 MG tablet Take 1/2 tablet to (2) half tablets by mouth twice daily as needed for anxiety. Note half tablets 05/09/13   Lauree Chandler, NP  cloNIDine (CATAPRES - DOSED IN MG/24 HR) 0.3 mg/24hr patch Place 0.3 mg onto the skin once a week. On Tuesday    Historical Provider, MD  diltiazem (CARDIZEM CD) 360 MG 24 hr capsule Take 360 mg by mouth daily.    Historical Provider, MD   diphenoxylate-atropine (LOMOTIL) 2.5-0.025 MG per tablet Take 1 tablet by mouth 4 (four) times daily as needed for diarrhea or loose stools. 07/01/13   [name], MD  DULoxetine (CYMBALTA) 20 MG capsule Take 40 mg by mouth daily.    Historical Provider, MD  famotidine (PEPCID) 20 MG tablet Take 1 tablet (20 mg total) by mouth 2 (two) times daily. 07/31/13   Radene Gunning, NP  gabapentin (NEURONTIN) 300 MG capsule Take 1 capsule (300 mg total) by mouth at bedtime. 05/07/13   Bary Leriche, PA-C  HYDROcodone-acetaminophen (NORCO/VICODIN) 5-325 MG per tablet Take one to two tablets by mouth every 4 hours as needed for moderate pain 05/14/13   Tiffany L Reed, DO  Lactobacillus [facility] ACIDOPHILUS) CAPS Take 1 capsule by mouth daily.    Historical Provider, MD  polyethylene glycol powder (GLYCOLAX/MIRALAX) powder Take 17 g by mouth daily as needed for mild constipation.  06/16/13   Historical Provider, MD  traZODone (DESYREL) 50 MG tablet Take 1 tablet (50 mg total) by mouth at bedtime as needed for sleep. 09/03/13   Tiffany L Reed, DO    Current Facility-Administered Medications  Medication Dose Route Frequency Provider Last Rate Last Dose  . 0.9 %  sodium chloride infusion   Intravenous Continuous Shanda Howells, MD 50 mL/hr at 04/26/14 0941    . 0.9 %  sodium chloride infusion   Intravenous Once Erline Hau, MD   10 mL at 04/26/14 0943  . acetaminophen (TYLENOL) tablet 650 mg  650 mg Oral Once Delora Fuel, MD   [id number] mg at 04/26/14 0240  . fentaNYL (SUBLIMAZE) injection 25 mcg  25 mcg Intravenous Q2H PRN Donita Brooks, NP      . ondansetron (ZOFRAN) injection 4 mg  4 mg Intravenous 4 times per day Shanda Howells, MD   4 mg at 04/26/14 0323  . pantoprazole (PROTONIX) 80 mg in sodium chloride 0.9 % 250 mL (0.32 mg/mL) infusion  8 mg/hr Intravenous Continuous Donita Brooks, NP 25 mL/hr at 04/26/14 1606 8 mg/hr at 04/26/14 1606  . [START ON 04/30/2014] pantoprazole (PROTONIX) injection 40 mg  40 mg  Intravenous Q12H Brandi L Ollis, NP      . sodium chloride 0.9 % injection 3 mL  3 mL Intravenous Q12H Shanda Howells, MD   3 mL at 04/26/14 0941    Allergies as of 04/25/2014 - Review Complete 04/25/2014  Allergen Reaction Noted  . Hydrochlorothiazide Nausea And Vomiting 10/03/2012    Family History  Problem Relation Age of Onset  . Diabetes Mother   . Diabetes Father   . Hypertension Sister   . Diabetes Brother   . Seizures Son   . Kidney disease Maternal Grandmother   . Hypertension Maternal Grandmother   . Heart disease Maternal Grandfather   . Diabetes Paternal Grandmother   . Heart disease Paternal Grandfather  History   Social History  . Marital Status: Married    Spouse Name: N/A  . Number of Children: N/A  . Years of Education: N/A   Occupational History  . Not on file.   Social History Main Topics  . Smoking status: Former Smoker -- 1.00 packs/day for 44 years    Types: Cigarettes    Quit date: 10/03/2012  . Smokeless tobacco: Never Used  . Alcohol Use: No  . Drug Use: No  . Sexual Activity: Yes    Birth Control/ Protection: Surgical   Other Topics Concern  . Not on file   Social History Narrative    Review of Systems: Positive = bold Gen: Denies any fever, chills, rigors, night sweats, anorexia, fatigue, weakness, malaise, involuntary weight loss, and sleep disorder CV: Denies chest pain, angina, palpitations, syncope, orthopnea, PND, peripheral edema, and claudication. Resp: Denies dyspnea, cough, sputum, wheezing, coughing up blood. GI: Described in detail in HPI.    GU : Denies urinary burning, blood in urine, urinary frequency, urinary hesitancy, nocturnal urination, and urinary incontinence. MS: Denies joint pain or swelling.  Denies muscle weakness, cramps, atrophy.  Derm: Denies rash, itching, oral ulcerations, hives, unhealing ulcers.  Psych: Denies depression, anxiety, memory loss, suicidal ideation, hallucinations,  and  confusion. Heme: Denies bruising, bleeding, and enlarged lymph nodes. Neuro:  Denies any headaches, dizziness, paresthesias. Endo:  Denies any problems with DM, thyroid, adrenal function.  Physical Exam: Vital signs in last 24 hours: Temp:  [97.9 F (36.6 C)-98.8 F (37.1 C)] 97.9 F (36.6 C) (02/26 1430) Pulse Rate:  [90-134] 95 (02/26 1600) Resp:  [11-30] 14 (02/26 1600) BP: (61-165)/(19-91) 120/73 mmHg (02/26 1600) SpO2:  [95 %-100 %] 97 % (02/26 1600) FiO2 (%):  [28 %] 28 % (02/26 1115) Weight:  [83.915 kg (185 lb)-98.5 kg (217 lb 2.5 oz)] 98.5 kg (217 lb 2.5 oz) (02/26 0440) Last BM Date: 04/25/14 General:   Alert,  Well-developed, well-nourished, pleasant and cooperative in NAD Head:  Normocephalic and atraumatic. Eyes:  Sclera clear, no icterus.   Conjunctiva pink. Ears:  Normal auditory acuity. Nose:  No deformity, discharge,  or lesions. Mouth:  No deformity or lesions.  Oropharynx pink & moist. Neck:  Supple; no masses or thyromegaly. Lungs:  Clear throughout to auscultation.   No wheezes, crackles, or rhonchi. No acute distress. Heart:  Regular rate and rhythm; no murmurs, clicks, rubs,  or gallops. Abdomen:  Soft, nontender and nondistended. No masses, hepatosplenomegaly or hernias noted. Normal bowel sounds, without guarding, and without rebound.     Msk:  Symmetrical without gross deformities. Normal posture. Pulses:  Normal pulses noted. Extremities:  Without clubbing or edema. Neurologic:  Alert and  oriented x4;  grossly normal neurologically. Skin:  Intact without significant lesions or rashes. Cervical Nodes:  No significant cervical adenopathy. Psych:  Alert and cooperative. Normal mood and affect.   Lab Results:  Recent Labs  04/25/14 2347 04/26/14 0234  WBC 17.7* 18.3*  HGB 4.9* 4.4*  HCT 14.9* 13.6*  PLT 355 342   BMET  Recent Labs  04/25/14 2347  NA 134*  K 3.3*  CL 105  CO2 22  GLUCOSE 225*  BUN 40*  CREATININE 1.50*  CALCIUM 7.8*    LFT No results for input(s): PROT, ALBUMIN, AST, ALT, ALKPHOS, BILITOT, BILIDIR, IBILI in the last 72 hours. PT/INR No results for input(s): LABPROT, INR in the last 72 hours.  Studies/Results: Dg Chest 2 View  04/26/2014   CLINICAL DATA:  Nausea, vomiting, and generalized weakness.  EXAM: CHEST  2 VIEW  COMPARISON:  Radiographs 07/30/2013. CT 04/17/2013. Left shoulder series 07/26/13  FINDINGS: Mild hyperinflation. Borderline cardiomegaly again seen. There is minimal blunting of left costophrenic angle without frank pleural effusion. Pulmonary vasculature is normal. No consolidation or pneumothorax. No acute osseous abnormalities are seen. Pseudosubluxation of the left shoulder is unchanged from prior exams. The bones appear under mineralized.  IMPRESSION: Mild hyperinflation and borderline cardiomegaly. Minimal blunting of left costophrenic angle is likely subsegmental atelectasis.   Electronically Signed   By: Jeb Levering M.D.   On: 04/26/2014 01:16   Impression:  1.  Melena, in setting of aspirin and clopidigrel.  Suspect ulcer.  Had been hypotensive and tachycardic, but has responded well with volume (fluids and blood) resuscitation. 2.  Anemia.  Suspect patient might have had some smoldering bleeding for the past week or two, but bleeding has picked up over the past few days. 3.  History stroke and multiple medical problems.  Plan:  1.  Protonix drip. 2.  Stat post-transfusion CBC. 3.  Hold aspirin and clopidigrel and any other blood thinners or anticoagulants. 4.  Endoscopy later today, after confirmation of increased post-transfusion hemoglobin. 5.  Risks (bleeding, infection, bowel perforation that could require surgery, sedation-related changes in cardiopulmonary systems), benefits (identification and possible treatment of source of symptoms, exclusion of certain causes of symptoms), and alternatives (watchful waiting, radiographic imaging studies, empiric medical treatment) of  upper endoscopy (EGD) were explained to patient/family in detail and patient wishes to proceed.   LOS: 0 days   Shunna Mikaelian M  04/26/2014, 4:21 PM

## 2014-04-26 NOTE — Brief Op Note (Signed)
Clean-based ulcers in duodenum. Large esophageal ulcer without any bleeding stigmata noted. See endopro for details. NPO tonight except for ice chips and if stable then clear liquids tomorrow. Continue Protonix infusion.

## 2014-04-27 DIAGNOSIS — Z720 Tobacco use: Secondary | ICD-10-CM

## 2014-04-27 LAB — TYPE AND SCREEN
ABO/RH(D): A NEG
Antibody Screen: NEGATIVE
UNIT DIVISION: 0
Unit division: 0
Unit division: 0
Unit division: 0

## 2014-04-27 LAB — CBC
HCT: 27 % — ABNORMAL LOW (ref 36.0–46.0)
HCT: 27.7 % — ABNORMAL LOW (ref 36.0–46.0)
HCT: 28.9 % — ABNORMAL LOW (ref 36.0–46.0)
Hemoglobin: 9.3 g/dL — ABNORMAL LOW (ref 12.0–15.0)
Hemoglobin: 9.5 g/dL — ABNORMAL LOW (ref 12.0–15.0)
Hemoglobin: 9.9 g/dL — ABNORMAL LOW (ref 12.0–15.0)
MCH: 29 pg (ref 26.0–34.0)
MCH: 29 pg (ref 26.0–34.0)
MCH: 29.2 pg (ref 26.0–34.0)
MCHC: 34.3 g/dL (ref 30.0–36.0)
MCHC: 34.3 g/dL (ref 30.0–36.0)
MCHC: 34.4 g/dL (ref 30.0–36.0)
MCV: 84.5 fL (ref 78.0–100.0)
MCV: 84.8 fL (ref 78.0–100.0)
MCV: 84.9 fL (ref 78.0–100.0)
PLATELETS: 241 10*3/uL (ref 150–400)
Platelets: 240 10*3/uL (ref 150–400)
Platelets: 248 K/uL (ref 150–400)
RBC: 3.18 MIL/uL — ABNORMAL LOW (ref 3.87–5.11)
RBC: 3.28 MIL/uL — AB (ref 3.87–5.11)
RBC: 3.41 MIL/uL — ABNORMAL LOW (ref 3.87–5.11)
RDW: 14.9 % (ref 11.5–15.5)
RDW: 15.1 % (ref 11.5–15.5)
RDW: 15.2 % (ref 11.5–15.5)
WBC: 10.9 10*3/uL — ABNORMAL HIGH (ref 4.0–10.5)
WBC: 10.9 K/uL — ABNORMAL HIGH (ref 4.0–10.5)
WBC: 11.9 10*3/uL — ABNORMAL HIGH (ref 4.0–10.5)

## 2014-04-27 LAB — BASIC METABOLIC PANEL
Anion gap: 5 (ref 5–15)
BUN: 17 mg/dL (ref 6–23)
CHLORIDE: 110 mmol/L (ref 96–112)
CO2: 24 mmol/L (ref 19–32)
Calcium: 7.6 mg/dL — ABNORMAL LOW (ref 8.4–10.5)
Creatinine, Ser: 1.43 mg/dL — ABNORMAL HIGH (ref 0.50–1.10)
GFR calc Af Amer: 44 mL/min — ABNORMAL LOW (ref >90)
GFR, EST NON AFRICAN AMERICAN: 38 mL/min — AB (ref >90)
GLUCOSE: 150 mg/dL — AB (ref 70–99)
POTASSIUM: 2.9 mmol/L — AB (ref 3.5–5.1)
Sodium: 139 mmol/L (ref 135–145)

## 2014-04-27 MED ORDER — POTASSIUM CHLORIDE 10 MEQ/100ML IV SOLN
10.0000 meq | INTRAVENOUS | Status: AC
  Administered 2014-04-27 (×6): 10 meq via INTRAVENOUS
  Filled 2014-04-27 (×5): qty 100

## 2014-04-27 NOTE — Progress Notes (Signed)
eLink Physician-Brief Progress Note Patient Name: [name] DOB: 1950/09/26 MRN: [id number]   Date of Service  04/27/2014  HPI/Events of Note  Hypokalemia  eICU Interventions  Potassium replaced     Intervention Category Major Interventions: Electrolyte abnormality - evaluation and management  DETERDING,ELIZABETH 04/27/2014, 5:22 AM

## 2014-04-27 NOTE — H&P (Signed)
PULMONARY / CRITICAL CARE MEDICINE   Name: Kelli [name] MRN: [id number] DOB: Jun 19, 1950    ADMISSION DATE:  04/25/2014  REFERRING MD :  [facility]   CHIEF COMPLAINT:  GIB / Anemia  INITIAL PRESENTATION: 64 y/o F admitted 2/26 with weakness and large volume dark stools.  Work up notable for Hgb of 4.4.  Patient was transfused 4 units PRBC's and transferred to [facility] ICU for further care.    STUDIES:  2/26  CXR >> mild hyperinflation, mild atx LLL  SIGNIFICANT EVENTS: 2/26  Admit from [facility] with weakness, GIB, Hgb 4.4; EGD >> ulcers   SUBJECTIVE:  Feels thirsty.  VITAL SIGNS: Temp:  [97.9 F (36.6 C)-98.7 F (37.1 C)] 98.4 F (36.9 C) (02/27 0857) Pulse Rate:  [83-109] 93 (02/27 0700) Resp:  [8-30] 14 (02/27 0700) BP: (75-132)/(40-95) 132/75 mmHg (02/27 0700) SpO2:  [93 %-100 %] 93 % (02/27 0700) FiO2 (%):  [28 %] 28 % (02/26 1115)   INTAKE / OUTPUT:  Intake/Output Summary (Last 24 hours) at 04/27/14 0913 Last data filed at 04/27/14 0400  Gross per 24 hour  Intake 2901.25 ml  Output      0 ml  Net 2901.25 ml    PHYSICAL EXAMINATION: General:  Obese female in NAD Neuro: weak Lt lower extremity HEENT:  Mm pink/moist, short thick neck Cardiovascular:  s1s2 rrr, no m/r/g Lungs:  resp's even/non-labored, lungs clear Abdomen:  Obese, soft, bsx4 active  Musculoskeletal:  No acute deformities Skin:  No acute defomities  LABS:  CBC  Recent Labs Lab 04/26/14 2030 04/27/14 0019 04/27/14 0403  WBC 10.8* 10.9* 10.9*  HGB 9.2* 9.9* 9.3*  HCT 26.7* 28.9* 27.0*  PLT 241 248 241    BMET  Recent Labs Lab 04/25/14 2347 04/27/14 0403  NA 134* 139  K 3.3* 2.9*  CL 105 110  CO2 22 24  BUN 40* 17  CREATININE 1.50* 1.43*  GLUCOSE 225* 150*   Electrolytes  Recent Labs Lab 04/25/14 2347 04/27/14 0403  CALCIUM 7.8* 7.6*   Sepsis Markers  Recent Labs Lab 04/26/14 0225  LATICACIDVEN 1.26     Cardiac Enzymes  Recent Labs Lab 04/25/14 2347  TROPONINI 0.03    Glucose  Recent Labs Lab 04/26/14 1652  GLUCAP 165*    Imaging Dg Chest 2 View  04/26/2014   CLINICAL DATA:  Nausea, vomiting, and generalized weakness.  EXAM: CHEST  2 VIEW  COMPARISON:  Radiographs 07/30/2013. CT 04/17/2013. Left shoulder series 07/26/13  FINDINGS: Mild hyperinflation. Borderline cardiomegaly again seen. There is minimal blunting of left costophrenic angle without frank pleural effusion. Pulmonary vasculature is normal. No consolidation or pneumothorax. No acute osseous abnormalities are seen. Pseudosubluxation of the left shoulder is unchanged from prior exams. The bones appear under mineralized.  IMPRESSION: Mild hyperinflation and borderline cardiomegaly. Minimal blunting of left costophrenic angle is likely subsegmental atelectasis.   Electronically Signed   By: Jeb Levering M.D.   On: 04/26/2014 01:16     ASSESSMENT / PLAN:  GASTROINTESTINAL A:   GIB from PUD - suspect upper with melena, Aleve usage (small dose) GERD Nausea  Stress Incontinence  P:   Clear liquid diet Protonix q12h F/u H pylori Hold detrol for now NO NSAIDS  PULMONARY A: Hx Tobacco Abuse  P:   Oxygen to support sats 90-95% Pulmonary hygiene   CARDIOVASCULAR A:  Hemorrhagic shock 2nd to GI bleed >> resolved. CAD HTN HLD  P:  Hold outpt norvasc, catapres, hydralazine, zocor No ASA, plavix  until okay with GI  RENAL A:   Acute on Chronic CKD III - baseline sr cr ~1.25 Hypokalemia   P:   Trend BMP Replace electrolytes as indicated   HEMATOLOGIC A:   Anemia - chronic disease + GI losses P:  Transfuse for Hb < 7 or bleeding  INFECTIOUS A:   Subjective Sx UTI - neg UA  P:   Rocephin x 1 at [facility], hold further abx & monitor   ENDOCRINE A:   Hyperglycemia - borderline DM    P:   SSI if consistently > 180  NEUROLOGIC A: Hx CVA with residual LUE, BLE weakness   Fibromyalgia  Depression / Anxiety  P:   Hold outpt effexor, klonopin, neurontin, trazodone for  now   Transfer to SDU 2/27 >> to triad 2/28 and PCCM off.  Chesley Mires, MD [facility] Pulmonary/Critical Care 04/27/2014, 9:22 AM Pager:  [phone] After 3pm call: [phone]

## 2014-04-27 NOTE — Plan of Care (Signed)
Problem: Phase I Progression Outcomes Goal: Initial discharge plan identified Outcome: Completed/Met Date Met:  04/27/14 Lives with husband and has an aide during the day.

## 2014-04-27 NOTE — Progress Notes (Signed)
Patient ID: Kelli [name], female   DOB: 23-Nov-1950, 63 y.o.   MRN: [id number] [facility] Gastroenterology Progress Note  Kelli [name] 64 y.o. 1950/03/26   Subjective: Feels ok. No rectal bleeding overnight.  Objective: Vital signs in last 24 hours: Filed Vitals:   04/27/14 0857  BP: 132/75  Pulse: 93  Temp: 98.4 F (36.9 C)  Resp: 14    Physical Exam: Gen: lethargic, no acute distress, well-nourished Abd: soft, nontender, nondistended, +BS  Lab Results:  Recent Labs  04/25/14 2347 04/27/14 0403  NA 134* 139  K 3.3* 2.9*  CL 105 110  CO2 22 24  GLUCOSE 225* 150*  BUN 40* 17  CREATININE 1.50* 1.43*  CALCIUM 7.8* 7.6*   No results for input(s): AST, ALT, ALKPHOS, BILITOT, PROT, ALBUMIN in the last 72 hours.  Recent Labs  04/25/14 2347  04/27/14 0019 04/27/14 0403  WBC 17.7*  < > 10.9* 10.9*  NEUTROABS 15.2*  --   --   --   HGB 4.9*  < > 9.9* 9.3*  HCT 14.9*  < > 28.9* 27.0*  MCV 86.1  < > 84.8 84.9  PLT 355  < > 248 241  < > = values in this interval not displayed. No results for input(s): LABPROT, INR in the last 72 hours.    Assessment/Plan: S/P GI bleed with EGD showing esophageal ulcers, duodenal ulcers, and one stomach ulcer. No further bleeding. Hgb stable. Start clear liquids. Ok to transfer pt to step-down bed. Continue Protonix infusion. Will follow.   [place] C. 04/27/2014, 9:52 AM

## 2014-04-28 LAB — CBC
HCT: 28.4 % — ABNORMAL LOW (ref 36.0–46.0)
HEMATOCRIT: 28.8 % — AB (ref 36.0–46.0)
HEMOGLOBIN: 9.7 g/dL — AB (ref 12.0–15.0)
Hemoglobin: 9.8 g/dL — ABNORMAL LOW (ref 12.0–15.0)
MCH: 29 pg (ref 26.0–34.0)
MCH: 29 pg (ref 26.0–34.0)
MCHC: 34.2 g/dL (ref 30.0–36.0)
MCHC: 34 g/dL (ref 30.0–36.0)
MCV: 84.8 fL (ref 78.0–100.0)
MCV: 85.2 fL (ref 78.0–100.0)
PLATELETS: 258 10*3/uL (ref 150–400)
Platelets: 214 10*3/uL (ref 150–400)
RBC: 3.35 MIL/uL — AB (ref 3.87–5.11)
RBC: 3.38 MIL/uL — ABNORMAL LOW (ref 3.87–5.11)
RDW: 15.1 % (ref 11.5–15.5)
RDW: 15 % (ref 11.5–15.5)
WBC: 10.9 10*3/uL — AB (ref 4.0–10.5)
WBC: 10.4 10*3/uL (ref 4.0–10.5)

## 2014-04-28 LAB — BASIC METABOLIC PANEL
Anion gap: 8 (ref 5–15)
BUN: 8 mg/dL (ref 6–23)
CALCIUM: 8.3 mg/dL — AB (ref 8.4–10.5)
CHLORIDE: 104 mmol/L (ref 96–112)
CO2: 28 mmol/L (ref 19–32)
Creatinine, Ser: 1.35 mg/dL — ABNORMAL HIGH (ref 0.50–1.10)
GFR calc Af Amer: 47 mL/min — ABNORMAL LOW (ref >90)
GFR calc non Af Amer: 41 mL/min — ABNORMAL LOW (ref >90)
GLUCOSE: 141 mg/dL — AB (ref 70–99)
Potassium: 3.1 mmol/L — ABNORMAL LOW (ref 3.5–5.1)
SODIUM: 140 mmol/L (ref 135–145)

## 2014-04-28 LAB — MAGNESIUM: Magnesium: 1.5 mg/dL (ref 1.5–2.5)

## 2014-04-28 MED ORDER — GABAPENTIN 300 MG PO CAPS
300.0000 mg | ORAL_CAPSULE | Freq: Every day | ORAL | Status: DC
  Administered 2014-04-28 – 2014-04-29 (×2): 300 mg via ORAL
  Filled 2014-04-28 (×3): qty 1

## 2014-04-28 MED ORDER — OXYMETAZOLINE HCL 0.05 % NA SOLN
1.0000 | Freq: Two times a day (BID) | NASAL | Status: DC
  Administered 2014-04-28 – 2014-04-30 (×5): 1 via NASAL
  Filled 2014-04-28 (×2): qty 15

## 2014-04-28 MED ORDER — VENLAFAXINE HCL ER 150 MG PO CP24
150.0000 mg | ORAL_CAPSULE | Freq: Every day | ORAL | Status: DC
  Administered 2014-04-29 – 2014-04-30 (×2): 150 mg via ORAL
  Filled 2014-04-28 (×3): qty 1

## 2014-04-28 MED ORDER — PANTOPRAZOLE SODIUM 40 MG IV SOLR
40.0000 mg | Freq: Two times a day (BID) | INTRAVENOUS | Status: DC
  Administered 2014-04-28 – 2014-04-29 (×4): 40 mg via INTRAVENOUS
  Filled 2014-04-28 (×6): qty 40

## 2014-04-28 MED ORDER — POTASSIUM CHLORIDE CRYS ER 20 MEQ PO TBCR
40.0000 meq | EXTENDED_RELEASE_TABLET | ORAL | Status: AC
  Administered 2014-04-28 (×2): 40 meq via ORAL
  Filled 2014-04-28 (×2): qty 2

## 2014-04-28 MED ORDER — FESOTERODINE FUMARATE ER 8 MG PO TB24
8.0000 mg | ORAL_TABLET | Freq: Every day | ORAL | Status: DC
  Administered 2014-04-28 – 2014-04-30 (×3): 8 mg via ORAL
  Filled 2014-04-28 (×3): qty 1

## 2014-04-28 MED ORDER — TRAZODONE HCL 50 MG PO TABS
50.0000 mg | ORAL_TABLET | Freq: Every evening | ORAL | Status: DC | PRN
  Administered 2014-04-28 – 2014-04-29 (×2): 50 mg via ORAL
  Filled 2014-04-28 (×3): qty 1

## 2014-04-28 NOTE — Progress Notes (Signed)
Patient ID: Kelli [name], female   DOB: December 27, 1950, 64 y.o.   MRN: [id number] [facility] Gastroenterology Progress Note  Kelli [name] 64 y.o. [birth year]   Subjective: Wants to eat. Denies bleeding overnight. Husband at bedside.  Objective: Vital signs in last 24 hours: Filed Vitals:   04/28/14 1000  BP: 132/71  Pulse: 92  Temp: 97.8  Resp: 19    Physical Exam: Gen: lethargic, no acute distress, obese Abd: soft, nontender, nondistended  Lab Results:  Recent Labs  04/27/14 0403 04/28/14 0344  NA 139 140  K 2.9* 3.1*  CL 110 104  CO2 24 28  GLUCOSE 150* 141*  BUN 17 8  CREATININE 1.43* 1.35*  CALCIUM 7.6* 8.3*  MG  --  1.5   No results for input(s): AST, ALT, ALKPHOS, BILITOT, PROT, ALBUMIN in the last 72 hours.  Recent Labs  04/25/14 2347  04/27/14 1622 04/28/14 0344  WBC 17.7*  < > 11.9* 10.9*  NEUTROABS 15.2*  --   --   --   HGB 4.9*  < > 9.5* 9.7*  HCT 14.9*  < > 27.7* 28.4*  MCV 86.1  < > 84.5 84.8  PLT 355  < > 240 258  < > = values in this interval not displayed. No results for input(s): LABPROT, INR in the last 72 hours.    Assessment/Plan: S/P GI bleed from peptic ulcer disease - no further bleeding; Advance diet. Change to Protonix 40 mg IV Q 12 hours today. F/U H. Pylori serology and treat if positive. If stable and tolerating diet ok to go home Tues on PPI PO BID for 3 months and then QD indefinitely. Avoid NSAIDs. Will sign off. Call if questions.   [place] C. 04/28/2014, 11:33 AM

## 2014-04-28 NOTE — Progress Notes (Addendum)
[facility] Progress Note   NOTA BEW [id number] DOB: 1950-04-26 DOA: 04/25/2014 PCP: Manon Hilding, MD  Brief narrative: Kelli [name] is a 64 y.o. 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 Admitted to ICU service and underwent EGD on 2/26 revealing gastric, duodenal and esophageal ulcers. She was admitted to the ICU service and transferred to the hospitalist service today.    Subjective: C/o pain in right ear and a sore throat. She is also worried about the fact that she has been incontinent of urine and is asking for treatment. Per RN, she had a dark stool today.   Assessment/Plan: Principal Problem:   Hypovolemic shock - due to GI bleed and now resolved  Active Problems:   Upper GI bleed - takes Naproxen at home along with ASA and Plavix - Hb stabilizing no bleeding noted on EGD on 2/26 but still passing dark stools- this may be old blood- cont to follow in SDU and cont to check Hb every 12 hrs - cont Protonix infusion - per GI, switch to BID Protonix on 3/1    HTN (hypertension) - holding Hydralazine, Norvasc, Clonidine  Urinary incontinence - on Detrol LA at home- will continue  Hypokalemia - replace and recheck tomorrow   CKD (chronic kidney disease) stage 3, GFR 30-59 ml/min - Cr stable- baseline is near 1.2  Right ear ache - try Afrin for possible congestion    Tobacco abuse    H/o CVA - wheelchair bound but able to stand and Pivot to transfer- PT eval    Code Status: full Family Communication:  Disposition Plan: cont to follow in SDU today DVT prophylaxis: SCDs Consultants: GI Procedures:  EGD 2/26: Several clean-based duodenal ulcers, esophageal  ulcers, and stomach ulcer (see above) Distal erosive esophagitis Hiatal hernia Minimal antral gastritis No active bleeding  Antibiotics: Anti-infectives    Start     Dose/Rate Route Frequency Ordered Stop   04/26/14 0300  cefTRIAXone (ROCEPHIN) 1 g in dextrose 5 % 50 mL IVPB  Status:  Discontinued     1 g 100 mL/hr over 30 Minutes Intravenous Every 24 hours 04/26/14 0256 04/26/14 1558      Objective: Filed Weights   04/25/14 2154 04/26/14 0440  Weight: 83.915 kg (185 lb) 98.5 kg (217 lb 2.5 oz)    Intake/Output Summary (Last 24 hours) at 04/28/14 0941 Last data filed at 04/28/14 0700  Gross per 24 hour  Intake   2735 ml  Output      0 ml  Net   2735 ml     Vitals Filed Vitals:   04/28/14 0500 04/28/14 0600 04/28/14 0700 04/28/14 0744  BP:  114/78    Pulse: 103 106 103   Temp:    97.8 F (36.6 C)  TempSrc:    Oral  Resp: 13 14 11    Height:      Weight:      SpO2: 100% 100% 100%     Exam:  General:  Pt is alert, not in acute distress  HEENT: No icterus, No thrush  Cardiovascular: regular rate and rhythm, S1/S2 No murmur  Respiratory: clear to auscultation bilaterally   Abdomen: Soft, +Bowel sounds, non tender,  non distended, no guarding  MSK: No LE edema, cyanosis or clubbing  Data Reviewed: Basic Metabolic Panel:  Recent Labs Lab 04/25/14 2347 04/27/14 0403 04/28/14 0344  NA 134* 139 140  K 3.3* 2.9* 3.1*  CL 105 110 104  CO2 22 24 28   GLUCOSE 225* 150* 141*  BUN 40* 17 8  CREATININE 1.50* 1.43* 1.35*  CALCIUM 7.8* 7.6* 8.3*  MG  --   --  1.5   Liver Function Tests: No results for input(s): AST, ALT, ALKPHOS, BILITOT, PROT, ALBUMIN in the last 168 hours. No results for input(s): LIPASE, AMYLASE in the last 168 hours. No results for input(s): AMMONIA in the last 168 hours. CBC:  Recent Labs Lab 04/25/14 2347  04/26/14 2030 04/27/14 0019 04/27/14 0403 04/27/14 1622 04/28/14 0344  WBC 17.7*  < > 10.8* 10.9* 10.9* 11.9* 10.9*   NEUTROABS 15.2*  --   --   --   --   --   --   HGB 4.9*  < > 9.2* 9.9* 9.3* 9.5* 9.7*  HCT 14.9*  < > 26.7* 28.9* 27.0* 27.7* 28.4*  MCV 86.1  < > 82.9 84.8 84.9 84.5 84.8  PLT 355  < > 241 248 241 240 258  < > = values in this interval not displayed. Cardiac Enzymes:  Recent Labs Lab 04/25/14 2347  TROPONINI 0.03   BNP (last 3 results)  Recent Labs  04/25/14 2347  BNP 62.0    ProBNP (last 3 results) No results for input(s): PROBNP in the last 8760 hours.  CBG:  Recent Labs Lab 04/26/14 1652  GLUCAP 165*    Recent Results (from the past 240 hour(s))  MRSA PCR Screening     Status: None   Collection Time: 04/26/14  4:25 AM  Result Value Ref Range Status   MRSA by PCR NEGATIVE NEGATIVE Final    Comment:        The GeneXpert MRSA Assay (FDA approved for NASAL specimens only), is one component of a comprehensive MRSA colonization surveillance program. It is not intended to diagnose MRSA infection nor to guide or monitor treatment for MRSA infections.      Studies:  Recent x-ray studies have been reviewed in detail by the Attending Physician  Scheduled Meds:  Scheduled Meds: . ondansetron (ZOFRAN) IV  4 mg Intravenous 4 times per day  . oxymetazoline  1 spray Right Nare BID  . [START ON 04/30/2014] pantoprazole (PROTONIX) IV  40 mg Intravenous Q12H  . potassium chloride  40 mEq Oral Q4H  . sodium chloride  3 mL Intravenous Q12H   Continuous Infusions: . sodium chloride 50 mL/hr at 04/28/14 0700  . pantoprozole (PROTONIX) infusion 8 mg/hr (04/28/14 0700)    Time spent on care of this patient: 38 min   [place], MD 04/28/2014, 9:41 AM  LOS: 2 days   [facility] Office  [phone] Pager - Text Page per [email or web address]  If 7PM-7AM, please contact night-coverage [email or web address]

## 2014-04-29 ENCOUNTER — Encounter (HOSPITAL_COMMUNITY): Payer: Self-pay | Admitting: Gastroenterology

## 2014-04-29 LAB — CBC
HCT: 28.5 % — ABNORMAL LOW (ref 36.0–46.0)
HCT: 29.6 % — ABNORMAL LOW (ref 36.0–46.0)
Hemoglobin: 9.6 g/dL — ABNORMAL LOW (ref 12.0–15.0)
Hemoglobin: 9.8 g/dL — ABNORMAL LOW (ref 12.0–15.0)
MCH: 29.7 pg (ref 26.0–34.0)
MCH: 29.2 pg (ref 26.0–34.0)
MCHC: 33.7 g/dL (ref 30.0–36.0)
MCHC: 33.1 g/dL (ref 30.0–36.0)
MCV: 88.2 fL (ref 78.0–100.0)
MCV: 88.1 fL (ref 78.0–100.0)
Platelets: 232 10*3/uL (ref 150–400)
Platelets: 241 10*3/uL (ref 150–400)
RBC: 3.23 MIL/uL — ABNORMAL LOW (ref 3.87–5.11)
RBC: 3.36 MIL/uL — AB (ref 3.87–5.11)
RDW: 15.3 % (ref 11.5–15.5)
RDW: 15.4 % (ref 11.5–15.5)
WBC: 10.4 10*3/uL (ref 4.0–10.5)
WBC: 9.9 10*3/uL (ref 4.0–10.5)

## 2014-04-29 LAB — BASIC METABOLIC PANEL
ANION GAP: 9 (ref 5–15)
BUN: 12 mg/dL (ref 6–23)
CALCIUM: 7.8 mg/dL — AB (ref 8.4–10.5)
CO2: 24 mmol/L (ref 19–32)
Chloride: 102 mmol/L (ref 96–112)
Creatinine, Ser: 1.49 mg/dL — ABNORMAL HIGH (ref 0.50–1.10)
GFR calc Af Amer: 42 mL/min — ABNORMAL LOW (ref >90)
GFR, EST NON AFRICAN AMERICAN: 36 mL/min — AB (ref >90)
GLUCOSE: 156 mg/dL — AB (ref 70–99)
Potassium: 3.7 mmol/L (ref 3.5–5.1)
Sodium: 135 mmol/L (ref 135–145)

## 2014-04-29 LAB — H. PYLORI ANTIBODY, IGG

## 2014-04-29 LAB — POCT I-STAT 3, ART BLOOD GAS (G3+)
Bicarbonate: 25.4 mEq/L — ABNORMAL HIGH (ref 20.0–24.0)
O2 Saturation: 95 %
PCO2 ART: 40.5 mmHg (ref 35.0–45.0)
PH ART: 7.403 (ref 7.350–7.450)
PO2 ART: 73 mmHg — AB (ref 80.0–100.0)
Patient temperature: 98
TCO2: 27 mmol/L (ref 0–100)

## 2014-04-29 NOTE — Progress Notes (Signed)
[facility] Progress Note   Kelli [name] [id number] DOB: [birth year] DOA: 04/25/2014 PCP: Manon Hilding, MD  Brief narrative: Kelli [name] is a 64 y.o. 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 Admitted to ICU service and underwent EGD on 2/26 revealing gastric, duodenal and esophageal ulcers. She was admitted to the ICU service and transferred to the hospitalist service today.    Subjective: No complaints today.   Assessment/Plan: Principal Problem:   Hypovolemic shock - due to GI bleed and now resolved  Active Problems:   Upper GI bleed - takes Naproxen at home along with ASA and Plavix - Hb has been stable -  per GI, switch to BID Protonix today- placed on soft diet    HTN (hypertension) - holding Hydralazine, Norvasc, Clonidine  Tachycardia-  Short lived- HR was in low 100s early this AM- back in 80s now  Urinary incontinence - on Detrol LA at home- will continue  Hypokalemia - replaced   CKD (chronic kidney disease) stage 3, GFR 30-59 ml/min - Cr stable- baseline is near 1.2  Right ear ache - used Afrin for possible congestion- pain resolved    Tobacco abuse    H/o CVA - wheelchair bound but able to stand and Pivot to transfer- PT eval    Code Status: full Family Communication:  Disposition Plan: transfer out of SDU today DVT prophylaxis: SCDs Consultants: GI Procedures:  EGD 2/26: Several clean-based duodenal ulcers, esophageal ulcers, and stomach ulcer (see above) Distal erosive esophagitis Hiatal hernia Minimal antral gastritis No active bleeding  Antibiotics: Anti-infectives    Start     Dose/Rate Route Frequency Ordered Stop   04/26/14  0300  cefTRIAXone (ROCEPHIN) 1 g in dextrose 5 % 50 mL IVPB  Status:  Discontinued     1 g 100 mL/hr over 30 Minutes Intravenous Every 24 hours 04/26/14 0256 04/26/14 1558      Objective: Filed Weights   04/25/14 2154 04/26/14 0440  Weight: 83.915 kg (185 lb) 98.5 kg (217 lb 2.5 oz)    Intake/Output Summary (Last 24 hours) at 04/29/14 0933 Last data filed at 04/29/14 0800  Gross per 24 hour  Intake   1875 ml  Output      5 ml  Net   1870 ml     Vitals Filed Vitals:   04/29/14 0600 04/29/14 0700 04/29/14 0721 04/29/14 0800  BP:    117/96  Pulse: 102 111  103  Temp:   98.3 F (36.8 C)   TempSrc:   Oral   Resp: 15 16  11   Height:      Weight:      SpO2: 93% 97%  96%    Exam:  General:  Pt is alert, not in acute distress  HEENT: No icterus, No thrush  Cardiovascular: regular rate and rhythm, S1/S2 No murmur  Respiratory: clear to auscultation bilaterally   Abdomen: Soft, +Bowel sounds, non tender, non distended, no guarding  MSK: No LE edema, cyanosis or clubbing  Data Reviewed: Basic Metabolic Panel:  Recent Labs Lab 04/25/14 2347 04/27/14 0403 04/28/14 0344 04/29/14 0400  NA 134* 139 140 135  K 3.3* 2.9* 3.1* 3.7  CL 105 110  104 102  CO2 22 24 28 24   GLUCOSE 225* 150* 141* 156*  BUN 40* 17 8 12   CREATININE 1.50* 1.43* 1.35* 1.49*  CALCIUM 7.8* 7.6* 8.3* 7.8*  MG  --   --  1.5  --    Liver Function Tests: No results for input(s): AST, ALT, ALKPHOS, BILITOT, PROT, ALBUMIN in the last 168 hours. No results for input(s): LIPASE, AMYLASE in the last 168 hours. No results for input(s): AMMONIA in the last 168 hours. CBC:  Recent Labs Lab 04/25/14 2347  04/27/14 0403 04/27/14 1622 04/28/14 0344 04/28/14 1720 04/29/14 0400  WBC 17.7*  < > 10.9* 11.9* 10.9* 10.4 10.4  NEUTROABS 15.2*  --   --   --   --   --   --   HGB 4.9*  < > 9.3* 9.5* 9.7* 9.8* 9.6*  HCT 14.9*  < > 27.0* 27.7* 28.4* 28.8* 28.5*  MCV 86.1  < > 84.9 84.5 84.8 85.2 88.2  PLT  355  < > 241 240 258 214 232  < > = values in this interval not displayed. Cardiac Enzymes:  Recent Labs Lab 04/25/14 2347  TROPONINI 0.03   BNP (last 3 results)  Recent Labs  04/25/14 2347  BNP 62.0    ProBNP (last 3 results) No results for input(s): PROBNP in the last 8760 hours.  CBG:  Recent Labs Lab 04/26/14 1652  GLUCAP 165*    Recent Results (from the past 240 hour(s))  MRSA PCR Screening     Status: None   Collection Time: 04/26/14  4:25 AM  Result Value Ref Range Status   MRSA by PCR NEGATIVE NEGATIVE Final    Comment:        The GeneXpert MRSA Assay (FDA approved for NASAL specimens only), is one component of a comprehensive MRSA colonization surveillance program. It is not intended to diagnose MRSA infection nor to guide or monitor treatment for MRSA infections.      Studies:  Recent x-ray studies have been reviewed in detail by the Attending Physician  Scheduled Meds:  Scheduled Meds: . fesoterodine  8 mg Oral Daily  . gabapentin  300 mg Oral QHS  . ondansetron (ZOFRAN) IV  4 mg Intravenous 4 times per day  . oxymetazoline  1 spray Right Nare BID  . pantoprazole (PROTONIX) IV  40 mg Intravenous Q12H  . sodium chloride  3 mL Intravenous Q12H  . venlafaxine XR  150 mg Oral Q breakfast   Continuous Infusions: . sodium chloride 50 mL/hr at 04/29/14 0800    Time spent on care of this patient: 35 min   [place], MD 04/29/2014, 9:33 AM  LOS: 3 days   [facility] Office  [phone] Pager - Text Page per [email or web address]  If 7PM-7AM, please contact night-coverage [email or web address]

## 2014-04-29 NOTE — Progress Notes (Signed)
SLP Cancellation Note  Patient Details Name: Kelli [name] MRN: [id number] DOB: 16-May-1950   Cancelled treatment:        Pt up in recliner, but sleeping soundly.  SLP unable to arouse pt sufficiently for po's.   Chart reviewed and it is noted that the GI MD has advanced pt to regular consistencies, stating that the GI bleed is resolved.  Pt has a h/o dysphagia following stroke, but had been advanced by SLP 05/03/13 following MBS, and has tolerated a regular diet at home.  Will defer diet recommendations to GI.  If further SLP assist is indicated, please reorder.    Quinn Axe T 04/29/2014, 10:41 AM

## 2014-04-30 DIAGNOSIS — K264 Chronic or unspecified duodenal ulcer with hemorrhage: Principal | ICD-10-CM

## 2014-04-30 LAB — CBC
HEMATOCRIT: 29 % — AB (ref 36.0–46.0)
Hemoglobin: 9.5 g/dL — ABNORMAL LOW (ref 12.0–15.0)
MCH: 29.2 pg (ref 26.0–34.0)
MCHC: 32.8 g/dL (ref 30.0–36.0)
MCV: 89.2 fL (ref 78.0–100.0)
PLATELETS: 262 10*3/uL (ref 150–400)
RBC: 3.25 MIL/uL — ABNORMAL LOW (ref 3.87–5.11)
RDW: 15.4 % (ref 11.5–15.5)
WBC: 9.9 10*3/uL (ref 4.0–10.5)

## 2014-04-30 MED ORDER — HYDRALAZINE HCL 50 MG PO TABS
50.0000 mg | ORAL_TABLET | Freq: Four times a day (QID) | ORAL | Status: DC
  Administered 2014-04-30 (×2): 50 mg via ORAL
  Filled 2014-04-30 (×3): qty 1

## 2014-04-30 MED ORDER — PANTOPRAZOLE SODIUM 40 MG PO TBEC: 40.0000 mg | DELAYED_RELEASE_TABLET | Freq: Two times a day (BID) | ORAL | Status: DC

## 2014-04-30 MED ORDER — PANTOPRAZOLE SODIUM 40 MG PO TBEC
40.0000 mg | DELAYED_RELEASE_TABLET | Freq: Two times a day (BID) | ORAL | Status: DC
  Administered 2014-04-30: 40 mg via ORAL
  Filled 2014-04-30: qty 1

## 2014-04-30 MED ORDER — CLONIDINE HCL 0.3 MG/24HR TD PTWK
0.3000 mg | MEDICATED_PATCH | TRANSDERMAL | Status: DC
  Administered 2014-04-30: 0.3 mg via TRANSDERMAL
  Filled 2014-04-30: qty 1

## 2014-04-30 MED ORDER — AMLODIPINE BESYLATE 10 MG PO TABS
10.0000 mg | ORAL_TABLET | Freq: Every day | ORAL | Status: DC
  Administered 2014-04-30: 10 mg via ORAL
  Filled 2014-04-30: qty 1

## 2014-04-30 NOTE — Discharge Summary (Addendum)
Physician Discharge Summary  Kelli [name] [id number] DOB: October 30, 1950 DOA: 04/25/2014  PCP: Manon Hilding, MD  Admit date: 04/25/2014 Discharge date: 04/30/2014  Time spent: 45 minutes  Recommendations for Outpatient Follow-up:  1. Hold ASA and Plavix for 2 wks- discuss with PCP in 1 wk if she needs to resume both or just 1  2. Cont Protonix BID for 3 months and then daily per GI  Discharge Condition: stable Diet recommendation: low sodium heart healthy  Discharge Diagnoses:  Principal Problem:   Hypovolemic shock Active Problems:   Upper GI bleed   HTN (hypertension)   Tobacco abuse   CKD (chronic kidney disease) stage 3, GFR 30-59 ml/min PUD Acute blood loss anemia  History of present illness:  Kelli [name] is a 64 y.o. 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 Admitted to ICU service and underwent EGD on 2/26 revealing gastric, duodenal and esophageal ulcers. She was admitted to the ICU service and transferred to the hospitalist service today.   Hospital Course:  Principal Problem:  Hypovolemic shock - due to GI bleed and now resolved  Active Problems:  Upper GI bleed- acute blood loss anemia- due to ulcers in esophagus, stomach and duodenum - takes Naproxen at home along with ASA and Plavix - Hb has been in the 9-10 range since transfusions of 4 U PRBC on 2/26 -  no bleeding noted on EGD on 2/26  - has been on Protonix infusion per GI, switched to BID Protonix 2/29 and oral Protonix today - placed on soft diet which she is tolerating well - on ASA and Plavix at home- she has a h/o CVA and these were started at that time- there was a question of whether she had A-fib and a  loop recorder was placed at that time - Dr Michail Sermon advises that she be off of them for 2 more weeks- I have discussed this with the patient - H Pylori IgG ordered by Dr Michail Sermon is negative   HTN (hypertension) - have been holding Hydralazine, Norvasc, Clonidine due to hypotension - have now resumed them as BP high  Urinary incontinence - on Detrol LA at home- will continue  Hypokalemia - replaced   CKD (chronic kidney disease) stage 3, GFR 30-59 ml/min - Cr has been running 1.3- 1.4  Right ear ache - improved with Afrin for possible congestion    Procedures: EGD-Several clean-based duodenal ulcers, esophageal ulcers, and stomach ulcer (see above) Distal erosive esophagitis Hiatal hernia Minimal antral gastritis  No active bleeding  Consultations:  GI consult -   Discharge Exam: Filed Weights   04/25/14 2154 04/26/14 0440 04/29/14 2303  Weight: 83.915 kg (185 lb) 98.5 kg (217 lb 2.5 oz) 95.618 kg (210 lb 12.8 oz)   Filed Vitals:   04/30/14 1055  BP: 176/74  Pulse: 96  Temp: 97.9 F (36.6 C)  Resp: 19    General: AAO x 3, no distress Cardiovascular: RRR, no murmurs  Respiratory: clear to auscultation bilaterally GI: soft, non-tender, non-distended, bowel sound positive  Discharge Instructions You were cared for by a hospitalist during your hospital stay. If you have any questions about your discharge medications or the care you received  while you were in the hospital after you are discharged, you can call the unit and asked to speak with the hospitalist on call if the hospitalist that took care of you is not available. Once you are discharged, your primary care physician will handle any further medical issues. Please note that NO REFILLS for any discharge medications will be authorized once you are discharged, as it is imperative that you return to your primary care physician (or establish a relationship with a primary care physician if you do not have one)  for your aftercare needs so that they can reassess your need for medications and monitor your lab values.      Discharge Instructions    Diet - low sodium heart healthy    Complete by:  As directed      Discharge instructions    Complete by:  As directed   Do not take Aspirin and Plavix for 2 wks- see your doctor in 1 wk and ask if you need to be on one or both  Avoid Motrin/ Naprosyn/ Naproxen/ Alleve/ Advil/ BC or Goody powders     Increase activity slowly    Complete by:  As directed             Medication List    STOP taking these medications        aspirin 325 MG tablet     clopidogrel 75 MG tablet  Commonly known as:  PLAVIX     famotidine 20 MG tablet  Commonly known as:  PEPCID     HYDROcodone-acetaminophen 5-325 MG per tablet- was not taking  Commonly known as:  NORCO/VICODIN     traZODone 50 MG tablet- was not taking  Commonly known as:  DESYREL      TAKE these medications        acetaminophen 325 MG tablet  Commonly known as:  TYLENOL  Take 1-2 tablets (325-650 mg total) by mouth every 4 (four) hours as needed for mild pain.     amLODipine 10 MG tablet  Commonly known as:  NORVASC  Take 10 mg by mouth daily.     clonazePAM 0.5 MG tablet  Commonly known as:  KLONOPIN  Take 1/2 tablet to (2) half tablets by mouth twice daily as needed for anxiety. Note half tablets     cloNIDine 0.3 mg/24hr patch  Commonly known as:  CATAPRES - Dosed in mg/24 hr  Place 0.3 mg onto the skin once a week. On Tuesday     diphenoxylate-atropine 2.5-0.025 MG per tablet  Commonly known as:  LOMOTIL  Take 1 tablet by mouth 4 (four) times daily as needed for diarrhea or loose stools.     gabapentin 300 MG capsule  Commonly known as:  NEURONTIN  Take 1 capsule (300 mg total) by mouth at bedtime.     hydrALAZINE 50 MG tablet  Commonly known as:  APRESOLINE  Take 1 tablet (50 mg total) by mouth 4 (four) times daily.     pantoprazole 40 MG tablet  Commonly known as:   PROTONIX  Take 1 tablet (40 mg total) by mouth 2 (two) times daily.     simvastatin 10 MG tablet  Commonly known as:  ZOCOR  Take 1 tablet (10 mg total) by mouth daily at 6 PM.     tolterodine 4 MG 24 hr capsule  Commonly known as:  DETROL LA  Take 4 mg by mouth daily.     venlafaxine XR 150 MG 24 hr  capsule  Commonly known as:  EFFEXOR-XR  Take 150 mg by mouth daily with breakfast.       Allergies  Allergen Reactions  . Hydrochlorothiazide Nausea And Vomiting      The results of significant diagnostics from this hospitalization (including imaging, microbiology, ancillary and laboratory) are listed below for reference.    Significant Diagnostic Studies: Dg Chest 2 View  04/26/2014   CLINICAL DATA:  Nausea, vomiting, and generalized weakness.  EXAM: CHEST  2 VIEW  COMPARISON:  Radiographs 07/30/2013. CT 04/17/2013. Left shoulder series 07/26/13  FINDINGS: Mild hyperinflation. Borderline cardiomegaly again seen. There is minimal blunting of left costophrenic angle without frank pleural effusion. Pulmonary vasculature is normal. No consolidation or pneumothorax. No acute osseous abnormalities are seen. Pseudosubluxation of the left shoulder is unchanged from prior exams. The bones appear under mineralized.  IMPRESSION: Mild hyperinflation and borderline cardiomegaly. Minimal blunting of left costophrenic angle is likely subsegmental atelectasis.   Electronically Signed   By: Jeb Levering M.D.   On: 04/26/2014 01:16    Microbiology: Recent Results (from the past 240 hour(s))  MRSA PCR Screening     Status: None   Collection Time: 04/26/14  4:25 AM  Result Value Ref Range Status   MRSA by PCR NEGATIVE NEGATIVE Final    Comment:        The GeneXpert MRSA Assay (FDA approved for NASAL specimens only), is one component of a comprehensive MRSA colonization surveillance program. It is not intended to diagnose MRSA infection nor to guide or monitor treatment for MRSA  infections.      Labs: Basic Metabolic Panel:  Recent Labs Lab 04/25/14 2347 04/27/14 0403 04/28/14 0344 04/29/14 0400  NA 134* 139 140 135  K 3.3* 2.9* 3.1* 3.7  CL 105 110 104 102  CO2 22 24 28 24   GLUCOSE 225* 150* 141* 156*  BUN 40* 17 8 12   CREATININE 1.50* 1.43* 1.35* 1.49*  CALCIUM 7.8* 7.6* 8.3* 7.8*  MG  --   --  1.5  --    Liver Function Tests: No results for input(s): AST, ALT, ALKPHOS, BILITOT, PROT, ALBUMIN in the last 168 hours. No results for input(s): LIPASE, AMYLASE in the last 168 hours. No results for input(s): AMMONIA in the last 168 hours. CBC:  Recent Labs Lab 04/25/14 2347  04/28/14 0344 04/28/14 1720 04/29/14 0400 04/29/14 0908 04/30/14 0727  WBC 17.7*  < > 10.9* 10.4 10.4 9.9 9.9  NEUTROABS 15.2*  --   --   --   --   --   --   HGB 4.9*  < > 9.7* 9.8* 9.6* 9.8* 9.5*  HCT 14.9*  < > 28.4* 28.8* 28.5* 29.6* 29.0*  MCV 86.1  < > 84.8 85.2 88.2 88.1 89.2  PLT 355  < > 258 214 232 241 262  < > = values in this interval not displayed. Cardiac Enzymes:  Recent Labs Lab 04/25/14 2347  TROPONINI 0.03   BNP: BNP (last 3 results)  Recent Labs  04/25/14 2347  BNP 62.0    ProBNP (last 3 results) No results for input(s): PROBNP in the last 8760 hours.  CBG:  Recent Labs Lab 04/26/14 1652  GLUCAP 165*       SignedDebbe Odea, MD [facility] 04/30/2014, 12:30 PM

## 2014-04-30 NOTE — Progress Notes (Signed)
Patient Discharge:  Disposition: Pt discharged home with husband  Education: Pt and husband educated on medication changes, follow up appointments, and all discharge instructions. Pt and husband verbalized understanding.   IV: Removed  Telemetry: N/A  Follow-up appointments: Pt and husband instructed to make follow up appointment in 1 week.  Prescriptions: Sent to pt's pharmacy  Transportation: Transported home via car by husband  Belongings: All belongings taken with pt.

## 2014-05-01 SURGERY — EGD (ESOPHAGOGASTRODUODENOSCOPY)
Anesthesia: Moderate Sedation | Laterality: Left

## 2014-05-01 SURGERY — EGD (ESOPHAGOGASTRODUODENOSCOPY)
Anesthesia: Moderate Sedation

## 2014-05-10 ENCOUNTER — Encounter: Payer: Self-pay | Admitting: Internal Medicine

## 2014-05-16 ENCOUNTER — Ambulatory Visit (INDEPENDENT_AMBULATORY_CARE_PROVIDER_SITE_OTHER): Payer: 59 | Admitting: *Deleted

## 2014-05-16 DIAGNOSIS — I639 Cerebral infarction, unspecified: Secondary | ICD-10-CM

## 2014-05-16 LAB — MDC_IDC_ENUM_SESS_TYPE_REMOTE

## 2014-05-17 NOTE — Progress Notes (Signed)
Loop recorder 

## 2014-05-27 ENCOUNTER — Encounter: Payer: Self-pay | Admitting: Vascular Surgery

## 2014-05-28 ENCOUNTER — Ambulatory Visit: Payer: 59 | Admitting: Vascular Surgery

## 2014-05-28 ENCOUNTER — Other Ambulatory Visit: Payer: Self-pay | Admitting: Vascular Surgery

## 2014-05-28 ENCOUNTER — Other Ambulatory Visit (HOSPITAL_COMMUNITY): Payer: 59

## 2014-05-28 DIAGNOSIS — Z8673 Personal history of transient ischemic attack (TIA), and cerebral infarction without residual deficits: Secondary | ICD-10-CM

## 2014-05-28 DIAGNOSIS — Z48812 Encounter for surgical aftercare following surgery on the circulatory system: Secondary | ICD-10-CM

## 2014-05-28 DIAGNOSIS — I6523 Occlusion and stenosis of bilateral carotid arteries: Secondary | ICD-10-CM

## 2014-06-04 ENCOUNTER — Other Ambulatory Visit: Payer: Self-pay | Admitting: Internal Medicine

## 2014-06-11 ENCOUNTER — Encounter: Payer: Self-pay | Admitting: Internal Medicine

## 2014-06-11 ENCOUNTER — Ambulatory Visit (HOSPITAL_COMMUNITY)
Admission: RE | Admit: 2014-06-11 | Discharge: 2014-06-11 | Disposition: A | Discharge status: Home / Self Care | Payer: 59 | Source: Ambulatory Visit | Attending: Vascular Surgery | Admitting: Vascular Surgery

## 2014-06-11 DIAGNOSIS — I6523 Occlusion and stenosis of bilateral carotid arteries: Secondary | ICD-10-CM | POA: Diagnosis not present

## 2014-06-11 DIAGNOSIS — Z8673 Personal history of transient ischemic attack (TIA), and cerebral infarction without residual deficits: Secondary | ICD-10-CM | POA: Diagnosis not present

## 2014-06-11 DIAGNOSIS — Z48812 Encounter for surgical aftercare following surgery on the circulatory system: Secondary | ICD-10-CM | POA: Insufficient documentation

## 2014-06-17 ENCOUNTER — Ambulatory Visit (INDEPENDENT_AMBULATORY_CARE_PROVIDER_SITE_OTHER): Payer: 59 | Admitting: *Deleted

## 2014-06-17 ENCOUNTER — Encounter: Payer: Self-pay | Admitting: Vascular Surgery

## 2014-06-17 DIAGNOSIS — I639 Cerebral infarction, unspecified: Secondary | ICD-10-CM | POA: Diagnosis not present

## 2014-06-18 ENCOUNTER — Ambulatory Visit (INDEPENDENT_AMBULATORY_CARE_PROVIDER_SITE_OTHER): Payer: 59 | Admitting: Vascular Surgery

## 2014-06-18 ENCOUNTER — Encounter: Payer: Self-pay | Admitting: Vascular Surgery

## 2014-06-18 VITALS — BP 159/80 | HR 100 | Resp 18 | Ht 63.0 in | Wt 210.0 lb

## 2014-06-18 DIAGNOSIS — I6522 Occlusion and stenosis of left carotid artery: Secondary | ICD-10-CM

## 2014-06-18 DIAGNOSIS — I6521 Occlusion and stenosis of right carotid artery: Secondary | ICD-10-CM

## 2014-06-18 NOTE — Progress Notes (Signed)
Filed Vitals:   06/18/14 1529 06/18/14 1540  BP: 158/82 159/80  Pulse: 90 100  Resp: 18   Height: 5\' 3"  (1.6 m)   Weight: 210 lb (95.255 kg)   Body mass index is 37.21 kg/(m^2).

## 2014-06-18 NOTE — Progress Notes (Addendum)
HISTORY AND PHYSICAL     CC:  F/u carotid duplex Referring Provider:  Manon Hilding, MD  HPI: This is a 64 y.o. female who is here for f/u for her left CEA on 10/06/12.  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  She was also found to have retrograde flow in her left vertebral artery.  She has not had any neurological events since her last visit.  She is getting some movement back to her left hand and left leg.  Her left fingers tend to draw and she has been fitted for a splint.  She states that she was in the hospital at the end of February for bleeding ulcers and required blood transfusions.  She did not require surgery for this.  Otherwise, she has been doing well.  She is not on an aspirin due to this.  She is on a statin.    Past Medical History  Diagnosis Date  . Essential hypertension, benign   . Fibromyalgia   . Cerebral infarction Aug 2014    Bihemispheric watershed infarcts  . Mixed hyperlipidemia   . Borderline diabetes   . Urinary incontinence   . Carotid artery occlusion     Occluded RICA, status post left CEA  August 2014 - Dr. Donnetta Hutching  . CKD (chronic kidney disease) stage 3, GFR 30-59 ml/min   . 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 Closed dislocation of left humerus 07/26/2013  . Multiple gastric ulcers     Past Surgical History  Procedure Laterality Date  . Urethral dilation  1980's  . Combined hysterectomy vaginal w/ mmk / a&p repair  1981  . Vaginal hysterectomy  1981    "partial" (10/04/2012)  . Endarterectomy Left 10/06/2012    Procedure: Carotid Endarterectomy with Finesse patch angioplasty;  Surgeon: [name], MD;  Location: [place];  Service: Vascular;  Laterality: Left;  . Loop recorder implant  04/16/13    MDT LinQ implanted for cryptogenic stroke  . [name] without cardioversion N/A 04/16/2013    Procedure: TRANSESOPHAGEAL ECHOCARDIOGRAM ([name]);   Surgeon: Josue Hector, MD;  Location: [facility] ENDOSCOPY;  Service: Cardiovascular;  Laterality: N/A;  . Esophagogastroduodenoscopy N/A 04/26/2014    Procedure: ESOPHAGOGASTRODUODENOSCOPY (EGD);  Surgeon: Lear Ng, MD;  Location: [facility] ENDOSCOPY;  Service: Endoscopy;  Laterality: N/A;    Allergies  Allergen Reactions  . Hydrochlorothiazide Nausea And Vomiting    Current Outpatient Prescriptions  Medication Sig Dispense Refill  . acetaminophen (TYLENOL) 325 MG tablet Take 1-2 tablets (325-650 mg total) by mouth every 4 (four) hours as needed for mild pain.    Marland Kitchen amLODipine (NORVASC) 10 MG tablet Take 10 mg by mouth daily.    . clonazePAM (KLONOPIN) 0.5 MG tablet Take 1/2 tablet to (2) half tablets by mouth twice daily as needed for anxiety. Note half tablets 30 tablet 5  . cloNIDine (CATAPRES - DOSED IN MG/24 HR) 0.3 mg/24hr patch Place 0.3 mg onto the skin once a week. On Tuesday    . diphenoxylate-atropine (LOMOTIL) 2.5-0.025 MG per tablet Take 1 tablet by mouth 4 (four) times daily as needed for diarrhea or loose stools. 20 tablet 0  . gabapentin (NEURONTIN) 300 MG capsule Take 1 capsule (300 mg total) by mouth at bedtime.    . hydrALAZINE (APRESOLINE) 50 MG tablet Take 1 tablet (50 mg total) by mouth 4 (four) times daily.  270 tablet 3  . pantoprazole (PROTONIX) 40 MG tablet Take 1 tablet (40 mg total) by mouth 2 (two) times daily. 60 tablet 3  . simvastatin (ZOCOR) 10 MG tablet Take 1 tablet (10 mg total) by mouth daily at 6 PM. 30 tablet 1  . tolterodine (DETROL LA) 4 MG 24 hr capsule Take 4 mg by mouth daily.    Marland Kitchen venlafaxine XR (EFFEXOR-XR) 150 MG 24 hr capsule Take 150 mg by mouth daily with breakfast.     No current facility-administered medications for this visit.    Family History  Problem Relation Age of Onset  . Diabetes Mother   . Diabetes Father   . Hypertension Sister   . Diabetes Brother   . Seizures Son   . Kidney disease Maternal Grandmother   . Hypertension  Maternal Grandmother   . Heart disease Maternal Grandfather   . Diabetes Paternal Grandmother   . Heart disease Paternal Grandfather     History   Social History  . Marital Status: Married    Spouse Name: N/A  . Number of Children: N/A  . Years of Education: N/A   Occupational History  . Not on file.   Social History Main Topics  . Smoking status: Former Smoker -- 1.00 packs/day for 44 years    Types: Cigarettes    Quit date: 10/03/2012  . Smokeless tobacco: Never Used  . Alcohol Use: No  . Drug Use: No  . Sexual Activity: Yes    Birth Control/ Protection: Surgical   Other Topics Concern  . Not on file   Social History Narrative     ROS: [x]  Positive   [ ]  Negative   [ ]  All sytems reviewed and are negative  Cardiovascular: []  chest pain/pressure []  palpitations []  SOB lying flat [x]  DOE []  pain in legs while walking []  pain in feet when lying flat []  hx of DVT []  hx of phlebitis [x]  swelling in legs []  varicose veins  Pulmonary: []  productive cough []  asthma []  wheezing  Neurologic: [x]  weakness in [x]  left arm [x]  left leg [x]  numbness in [x]  left arm [x]  left leg [] difficulty speaking or slurred speech []  temporary loss of vision in one eye []  dizziness  Hematologic: []  bleeding problems []  problems with blood clotting easily  GI []  vomiting blood [x]  blood in stool-recent bleeding ulcers  GU: []  burning with urination []  blood in urine  Psychiatric: []  hx of major depression  Integumentary: []  rashes []  ulcers  Constitutional: []  fever []  chills   PHYSICAL EXAMINATION:  Filed Vitals:   06/18/14 1540  BP: 159/80  Pulse: 100  Resp:    Body mass index is 37.21 kg/(m^2).  General:  WDWN in NAD Gait: Not observed HENT: WNL, normocephalic Pulmonary: normal non-labored breathing , without Rales, rhonchi,  wheezing Cardiac: RRR, without  Murmurs, rubs or gallops; with  Left carotid bruit Abdomen: soft, NT, no masses Skin:  without rashes, without ulcers  Vascular Exam/Pulses:  Right Left  Radial 2+ (normal) 2+ (normal)  Carotid Occluded; no bruit + bruit   Extremities: without ischemic changes, without Gangrene , without cellulitis; without open wounds; + BLE edema with L>R Musculoskeletal: no muscle wasting or atrophy  Neurologic: A&O X 3; Appropriate Affect ; SENSATION: normal; MOTOR FUNCTION:  moving all extremities equally. Speech is fluent/normal   Non-Invasive Vascular Imaging:   Carotid artery duplex 06/11/14: 1.  Known occlusion of the right ICA 2.  Patent left CEA site with elevated velocities  of the left proximal/mid internal carotid artery which appears to be most likely due to a mild change in diameter, vessel tortuosity, and compensatory flow. 3.  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    Pt meds includes: Statin:  Yes.   Beta Blocker:  No. Aspirin:  No. ACEI:  No. ARB:  No. Other Antiplatelet/Anticoagulant:  No. (recent GI bleed)   ASSESSMENT/PLAN:: 64 y.o. female s/p left CEA 10/06/12 and known right carotid artery occlusion   -pt doing well since last visit without any neurological events -continue to monitor left carotid artery yearly (right ICA is occluded) -she is not on an aspirin due to recent GIB -she is not smoking -continue rehab -f/u in one year with carotid duplex   Leontine Locket, PA-C [facility] [phone]  Clinic MD:  Pt seen and examined in conjunction with Dr. Donnetta Hutching  VASCULAR QUALITY INITIATIVE FOLLOW UP DATA:  Current smoker: [  ] yes  [ x ] no  Living status: [ x ]  Home  [  ] Nursing home  [  ] Homeless    MEDS:  ASA [  ] yes  [ x ] no- [  x] medical reason  [  ] non compliant  STATIN  [ x ] yes  [  ] no- [  ] medical reason  [  ] non compliant  Beta blocker [  ] yes  [  x] no- [  ] medical reason  [  ] non compliant  ACE  inhibitor [  ] yes  [ x ] no- [  ] medical reason  [  ] non compliant  P2Y12 Antagonist [  x] none  [  ] clopidogrel-Plavix  [  ] ticlopidine-Ticlid   [  ] prasugrel-Effient  [  ] ticagrelor- Brilinta    Anticoagulant [  x] None  [  ] warfarin  [  ] rivaroxaban-Xarelto [  ] dabigatran- Pradaxa  Neurologic event since D/C:  [  ] no  [  ] yes: [  ] eye event  [  ] cortical event  [  ] VB event  [  ] non specific event  [  ] right  [  ] left  [  ] TIA  [  ] stroke  Date:   Modified Rankin Score: 4  MI since D/C: [ x ] no  [  ] troponin only  [  ] EKG or clinical  Cranial nerve injury: [ x ] none  [  ] resolved  [  ] persistent  Duplex CEA site: [  ] no  [  ] yes - PSV= 136  EDV= 40  ICA/CCA ratio:  Stenosis= [ x ] <40% [  ] 40-59% [  ] 60-79%  [  ] > 80%  [  ]  Occluded  CEA site re-operation:  [ x ] no   [  ] yes- date of re-op:  CEA site PCI:   [x  ] no   [  ] yes- date of PCI:   I have examined the patient, reviewed and agree with above.  EARLY, TODD, MD 06/18/2014 4:54 PM

## 2014-06-19 NOTE — Progress Notes (Signed)
Loop recorder 

## 2014-06-21 ENCOUNTER — Encounter: Payer: Self-pay | Admitting: Internal Medicine

## 2014-06-24 NOTE — Addendum Note (Signed)
Addended by: Mena Goes on: 06/24/2014 04:21 PM   Modules accepted: Orders

## 2014-07-03 ENCOUNTER — Encounter: Payer: Self-pay | Admitting: Internal Medicine

## 2014-07-10 ENCOUNTER — Encounter: Payer: Self-pay | Admitting: Cardiology

## 2014-07-16 ENCOUNTER — Ambulatory Visit (INDEPENDENT_AMBULATORY_CARE_PROVIDER_SITE_OTHER): Payer: 59 | Admitting: *Deleted

## 2014-07-16 DIAGNOSIS — I639 Cerebral infarction, unspecified: Secondary | ICD-10-CM | POA: Diagnosis not present

## 2014-07-16 LAB — CUP PACEART REMOTE DEVICE CHECK
Date Time Interrogation Session: 20160601153109
MDC IDC SESS DTM: 20160503040500

## 2014-07-17 NOTE — Progress Notes (Signed)
Loop recorder 

## 2014-07-18 ENCOUNTER — Encounter: Payer: Self-pay | Admitting: Internal Medicine

## 2014-07-22 ENCOUNTER — Other Ambulatory Visit: Payer: Self-pay | Admitting: Internal Medicine

## 2014-07-23 ENCOUNTER — Ambulatory Visit (INDEPENDENT_AMBULATORY_CARE_PROVIDER_SITE_OTHER): Payer: 59 | Admitting: Internal Medicine

## 2014-07-23 ENCOUNTER — Encounter: Payer: Self-pay | Admitting: Internal Medicine

## 2014-07-23 VITALS — BP 168/76 | HR 100 | Ht 65.0 in

## 2014-07-23 DIAGNOSIS — I739 Peripheral vascular disease, unspecified: Secondary | ICD-10-CM

## 2014-07-23 LAB — CUP PACEART INCLINIC DEVICE CHECK
Date Time Interrogation Session: 20160524161026
MDC IDC SET ZONE DETECTION INTERVAL: 360 ms
Zone Setting Detection Interval: 2000 ms
Zone Setting Detection Interval: 3000 ms

## 2014-07-23 NOTE — Assessment & Plan Note (Signed)
SHe has a persistent left hemiparesis. She will continue Plavix. ILR does not demonstrate atrial fib as the cause of her strokes yet.

## 2014-07-23 NOTE — Assessment & Plan Note (Signed)
No cluadication. She is very sedentary. Will follow.

## 2014-07-23 NOTE — Progress Notes (Signed)
HPI Kelli [name] returns today after a long absence from our arrhythmia clinic. She is a very pleasant 63 yo woman who has sustained 3 strokes of unclear etiology. After her last stroke, she had an ILR placed. She returns for followup. The patient has struggled with ambulation as she has a very dense hemiparesis. She has stopped going to physical therapy. She is unable to ambulate and is fearful of falling. She has rare palpitations. Allergies  Allergen Reactions  . Hydrochlorothiazide Nausea And Vomiting     Current Outpatient Prescriptions  Medication Sig Dispense Refill  . amLODipine (NORVASC) 10 MG tablet Take 10 mg by mouth daily.    . clonazePAM (KLONOPIN) 0.5 MG tablet Take 1/2 tablet to (2) half tablets by mouth twice daily as needed for anxiety. Note half tablets 30 tablet 5  . cloNIDine (CATAPRES) 0.1 MG tablet Take 0.1 mg by mouth 2 (two) times daily.    . clopidogrel (PLAVIX) 75 MG tablet Take 75 mg by mouth daily.    . hydrALAZINE (APRESOLINE) 50 MG tablet Take 1 tablet (50 mg total) by mouth 4 (four) times daily. 270 tablet 3  . pantoprazole (PROTONIX) 40 MG tablet Take 1 tablet (40 mg total) by mouth 2 (two) times daily. 60 tablet 3  . simvastatin (ZOCOR) 10 MG tablet Take 1 tablet (10 mg total) by mouth daily at 6 PM. 30 tablet 1  . venlafaxine XR (EFFEXOR-XR) 150 MG 24 hr capsule Take 150 mg by mouth daily with breakfast.     No current facility-administered medications for this visit.     Past Medical History  Diagnosis Date  . Essential hypertension, benign   . Fibromyalgia   . Cerebral infarction Aug 2014    Bihemispheric watershed infarcts  . Mixed hyperlipidemia   . Borderline diabetes   . Urinary incontinence   . Carotid artery occlusion     Occluded RICA, status post left CEA  August 2014 - Dr. Donnetta Hutching  . CKD (chronic kidney disease) stage 3, GFR 30-59 ml/min   . 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 Closed dislocation of  left humerus 07/26/2013  . Multiple gastric ulcers     ROS:   All systems reviewed and negative except as noted in the HPI.   Past Surgical History  Procedure Laterality Date  . Urethral dilation  1980's  . Combined hysterectomy vaginal w/ mmk / a&p repair  1981  . Vaginal hysterectomy  1981    "partial" (10/04/2012)  . Endarterectomy Left 10/06/2012    Procedure: Carotid Endarterectomy with Finesse patch angioplasty;  Surgeon: [name], MD;  Location: [place];  Service: Vascular;  Laterality: Left;  . Loop recorder implant  04/16/13    MDT LinQ implanted for cryptogenic stroke  . [name] without cardioversion N/A 04/16/2013    Procedure: TRANSESOPHAGEAL ECHOCARDIOGRAM ([name]);  Surgeon: Josue Hector, MD;  Location: [facility] ENDOSCOPY;  Service: Cardiovascular;  Laterality: N/A;  . Esophagogastroduodenoscopy N/A 04/26/2014    Procedure: ESOPHAGOGASTRODUODENOSCOPY (EGD);  Surgeon: Lear Ng, MD;  Location: [facility] ENDOSCOPY;  Service: Endoscopy;  Laterality: N/A;     Family History  Problem Relation Age of Onset  . Diabetes Mother   . Diabetes Father   . Hypertension Sister   . Diabetes Brother   . Seizures Son   . Kidney disease Maternal Grandmother   . Hypertension Maternal Grandmother   . Heart disease Maternal Grandfather   . Diabetes Paternal Grandmother   .  Heart disease Paternal Grandfather      History   Social History  . Marital Status: Married    Spouse Name: N/A  . Number of Children: N/A  . Years of Education: N/A   Occupational History  . Not on file.   Social History Main Topics  . Smoking status: Former Smoker -- 1.00 packs/day for 44 years    Types: Cigarettes    Quit date: 10/03/2012  . Smokeless tobacco: Never Used  . Alcohol Use: No  . Drug Use: No  . Sexual Activity: Yes    Birth Control/ Protection: Surgical   Other Topics Concern  . Not on file   Social History Narrative     BP 168/76 mmHg  Pulse 100  Ht 5\' 5"  (1.651 m)  Wt   SpO2  98%  Physical Exam:  Well appearing NAD HEENT: Unremarkable Neck:  No JVD, no thyromegally Lymphatics:  No adenopathy Back:  No CVA tenderness Lungs:  Clear HEART:  Regular rate rhythm, no murmurs, no rubs, no clicks Abd:  soft, positive bowel sounds, no organomegally, no rebound, no guarding Ext:  2 plus pulses, no edema, no cyanosis, no clubbing Skin:  No rashes no nodules Neuro:  CN II through XII intact, motor grossly intact except for dense left hemiparesis   DEVICE  Normal device function.  See PaceArt for details. 2 brief episodes of wide qrs tachycardia  Assess/Plan:

## 2014-07-23 NOTE — Patient Instructions (Signed)
Medication Instructions:  Your physician recommends that you continue on your current medications as directed. Please refer to the Current Medication list given to you today.   Labwork: NONE  Testing/Procedures: NONE  Follow-Up: Your physician recommends that you schedule a follow-up appointment in: 3 months with Dr. Lovena Le in [facility] OFFICE    Any Other Special Instructions Will Be Listed Below (If Applicable).

## 2014-07-25 ENCOUNTER — Encounter: Payer: Self-pay | Admitting: Internal Medicine

## 2014-08-05 ENCOUNTER — Encounter: Payer: Self-pay | Admitting: Internal Medicine

## 2014-08-15 ENCOUNTER — Ambulatory Visit (INDEPENDENT_AMBULATORY_CARE_PROVIDER_SITE_OTHER): Payer: 59 | Admitting: *Deleted

## 2014-08-15 DIAGNOSIS — I639 Cerebral infarction, unspecified: Secondary | ICD-10-CM

## 2014-08-15 NOTE — Progress Notes (Signed)
Loop recorder 

## 2014-08-19 ENCOUNTER — Encounter: Payer: Self-pay | Admitting: Internal Medicine

## 2014-08-23 LAB — CUP PACEART REMOTE DEVICE CHECK: Date Time Interrogation Session: 20160624131042

## 2014-09-10 ENCOUNTER — Encounter: Payer: Self-pay | Admitting: Internal Medicine

## 2014-09-13 ENCOUNTER — Ambulatory Visit (INDEPENDENT_AMBULATORY_CARE_PROVIDER_SITE_OTHER): Payer: 59 | Admitting: *Deleted

## 2014-09-13 DIAGNOSIS — I639 Cerebral infarction, unspecified: Secondary | ICD-10-CM

## 2014-09-17 NOTE — Progress Notes (Signed)
Loop recorder 

## 2014-09-19 ENCOUNTER — Encounter: Payer: Self-pay | Admitting: Internal Medicine

## 2014-09-19 LAB — CUP PACEART REMOTE DEVICE CHECK
Date Time Interrogation Session: 20160629021216
MDC IDC SET ZONE DETECTION INTERVAL: 3000 ms
Zone Setting Detection Interval: 2000 ms
Zone Setting Detection Interval: 360 ms

## 2014-10-10 ENCOUNTER — Encounter: Payer: Self-pay | Admitting: Internal Medicine

## 2014-10-14 ENCOUNTER — Ambulatory Visit (INDEPENDENT_AMBULATORY_CARE_PROVIDER_SITE_OTHER): Payer: Commercial Managed Care - HMO | Admitting: *Deleted

## 2014-10-14 DIAGNOSIS — I639 Cerebral infarction, unspecified: Secondary | ICD-10-CM | POA: Diagnosis not present

## 2014-10-15 NOTE — Progress Notes (Signed)
Loop recorder 

## 2014-10-23 LAB — CUP PACEART REMOTE DEVICE CHECK: Date Time Interrogation Session: 20160824102740

## 2014-10-23 NOTE — Progress Notes (Signed)
Carelink summary report received. Battery status OK. Normal device function. No new symptom episodes, tachy episodes, brady, or pause episodes. No new AF episodes. Monthly summary reports and ROV w/ GT 12/09/14.

## 2014-10-24 ENCOUNTER — Encounter: Payer: 59 | Admitting: Internal Medicine

## 2014-10-31 ENCOUNTER — Encounter: Payer: Self-pay | Admitting: Internal Medicine

## 2014-11-12 ENCOUNTER — Telehealth: Payer: Self-pay | Admitting: Internal Medicine

## 2014-11-12 NOTE — Telephone Encounter (Signed)
ON RECALL FOR TCS °

## 2014-11-12 NOTE — Telephone Encounter (Signed)
Letter mailed to pt.  

## 2014-11-13 ENCOUNTER — Ambulatory Visit (INDEPENDENT_AMBULATORY_CARE_PROVIDER_SITE_OTHER): Payer: Commercial Managed Care - HMO | Admitting: *Deleted

## 2014-11-13 DIAGNOSIS — I639 Cerebral infarction, unspecified: Secondary | ICD-10-CM | POA: Diagnosis not present

## 2014-11-14 NOTE — Progress Notes (Signed)
Loop recorder 

## 2014-11-21 LAB — CUP PACEART REMOTE DEVICE CHECK: MDC IDC SESS DTM: 20160914043621

## 2014-11-21 NOTE — Progress Notes (Signed)
Carelink summary report received. Battery status OK. Normal device function. No new symptom episodes, tachy episodes, brady, or pause episodes. No new AF episodes. Monthly summary reports and ROV with GT in [place] on 12/09/14 at 10:00am.

## 2014-12-05 ENCOUNTER — Encounter: Payer: Self-pay | Admitting: Internal Medicine

## 2014-12-09 ENCOUNTER — Ambulatory Visit (INDEPENDENT_AMBULATORY_CARE_PROVIDER_SITE_OTHER): Payer: Commercial Managed Care - HMO | Admitting: Internal Medicine

## 2014-12-09 ENCOUNTER — Encounter: Payer: Self-pay | Admitting: Internal Medicine

## 2014-12-09 VITALS — BP 174/80 | HR 98 | Ht 65.0 in

## 2014-12-09 DIAGNOSIS — I10 Essential (primary) hypertension: Secondary | ICD-10-CM

## 2014-12-09 DIAGNOSIS — E669 Obesity, unspecified: Secondary | ICD-10-CM

## 2014-12-09 LAB — CUP PACEART INCLINIC DEVICE CHECK
MDC IDC SESS DTM: 20161010131928
MDC IDC SET ZONE DETECTION INTERVAL: 2000 ms
MDC IDC SET ZONE DETECTION INTERVAL: 3000 ms
Zone Setting Detection Interval: 360 ms

## 2014-12-09 NOTE — Assessment & Plan Note (Signed)
Weight loss is encouraged 

## 2014-12-09 NOTE — Patient Instructions (Addendum)
Your physician wants you to follow-up in: 6 months with Dr Knox Saliva will receive a reminder letter in the mail two months in advance. If you don't receive a letter, please call our office to schedule the follow-up appointment.    Your physician recommends that you continue on your current medications as directed. Please refer to the Current Medication list given to you today.      Thank you for choosing [facility] !

## 2014-12-09 NOTE — Progress Notes (Signed)
HPI Mrs. Hamel returns today after a long absence from our arrhythmia clinic. She is a very pleasant 64 yo woman who has sustained 3 strokes of unclear etiology. After her last stroke, she had an ILR placed. She returns for followup. The patient has struggled with ambulation as she has a very dense hemiparesis. She has stopped going to physical therapy. She is unable to ambulate and is fearful of falling. She has rare palpitations.  Allergies  Allergen Reactions  . Hydrochlorothiazide Nausea And Vomiting     Current Outpatient Prescriptions  Medication Sig Dispense Refill  . amLODipine (NORVASC) 10 MG tablet Take 10 mg by mouth daily.    . clonazePAM (KLONOPIN) 0.5 MG tablet Take 1/2 tablet to (2) half tablets by mouth twice daily as needed for anxiety. Note half tablets 30 tablet 5  . cloNIDine (CATAPRES) 0.1 MG tablet Take 0.1 mg by mouth 2 (two) times daily.    . clopidogrel (PLAVIX) 75 MG tablet Take 75 mg by mouth daily.    . hydrALAZINE (APRESOLINE) 50 MG tablet Take 1 tablet (50 mg total) by mouth 4 (four) times daily. 270 tablet 3  . pantoprazole (PROTONIX) 40 MG tablet Take 1 tablet (40 mg total) by mouth 2 (two) times daily. 60 tablet 3  . simvastatin (ZOCOR) 10 MG tablet Take 1 tablet (10 mg total) by mouth daily at 6 PM. 30 tablet 1  . venlafaxine XR (EFFEXOR-XR) 150 MG 24 hr capsule Take 150 mg by mouth daily with breakfast.     No current facility-administered medications for this visit.     Past Medical History  Diagnosis Date  . Essential hypertension, benign   . Fibromyalgia   . Cerebral infarction [facility]) Aug 2014    Bihemispheric watershed infarcts  . Mixed hyperlipidemia   . Borderline diabetes   . Urinary incontinence   . Carotid artery occlusion     Occluded RICA, status post left CEA  August 2014 - Dr. Donnetta Hutching  . CKD (chronic kidney disease) stage 3, GFR 30-59 ml/min   . Cerebral infarction involving left cerebellar artery [facility]) Feb 2015  . Closed  dislocation of left humerus 07/26/2013  . Multiple gastric ulcers     ROS:   All systems reviewed and negative except as noted in the HPI.   Past Surgical History  Procedure Laterality Date  . Urethral dilation  1980's  . Combined hysterectomy vaginal w/ mmk / a&p repair  1981  . Vaginal hysterectomy  1981    "partial" (10/04/2012)  . Endarterectomy Left 10/06/2012    Procedure: Carotid Endarterectomy with Finesse patch angioplasty;  Surgeon: [name], MD;  Location: [place];  Service: Vascular;  Laterality: Left;  . Loop recorder implant  04/16/13    MDT LinQ implanted for cryptogenic stroke  . [name] without cardioversion N/A 04/16/2013    Procedure: TRANSESOPHAGEAL ECHOCARDIOGRAM ([name]);  Surgeon: Josue Hector, MD;  Location: [facility] ENDOSCOPY;  Service: Cardiovascular;  Laterality: N/A;  . Esophagogastroduodenoscopy N/A 04/26/2014    Procedure: ESOPHAGOGASTRODUODENOSCOPY (EGD);  Surgeon: Lear Ng, MD;  Location: [facility] ENDOSCOPY;  Service: Endoscopy;  Laterality: N/A;     Family History  Problem Relation Age of Onset  . Diabetes Mother   . Diabetes Father   . Hypertension Sister   . Diabetes Brother   . Seizures Son   . Kidney disease Maternal Grandmother   . Hypertension Maternal Grandmother   . Heart disease Maternal Grandfather   . Diabetes  Paternal Grandmother   . Heart disease Paternal Grandfather      Social History   Social History  . Marital Status: Married    Spouse Name: N/A  . Number of Children: N/A  . Years of Education: N/A   Occupational History  . Not on file.   Social History Main Topics  . Smoking status: Former Smoker -- 1.00 packs/day for 44 years    Types: Cigarettes    Quit date: 10/03/2012  . Smokeless tobacco: Never Used  . Alcohol Use: No  . Drug Use: No  . Sexual Activity: Yes    Birth Control/ Protection: Surgical   Other Topics Concern  . Not on file   Social History Narrative     BP 174/80 mmHg  Pulse 98  Ht 5\' 5"   (1.651 m)  Wt   SpO2 98%  Physical Exam:  Chronically ill appearing NAD HEENT: Unremarkable Neck:  No JVD, no thyromegally Lymphatics:  No adenopathy Back:  No CVA tenderness Lungs:  Clear HEART:  Regular rate rhythm, no murmurs, no rubs, no clicks Abd:  soft, positive bowel sounds, no organomegally, no rebound, no guarding Ext:  2 plus pulses, no edema, no cyanosis, no clubbing Skin:  No rashes no nodules Neuro:  CN II through XII intact, motor grossly intact except for dense left hemiparesis   DEVICE  Normal device function.  See PaceArt for details. 2 brief episodes of wide qrs tachycardia  Assess/Plan:

## 2014-12-09 NOTE — Assessment & Plan Note (Signed)
Her blood pressure is high but has been higher. She admits to dietary indiscretion. I have asked her to reduce her salt intake.

## 2014-12-09 NOTE — Assessment & Plan Note (Signed)
She has significant residual. The etiology is still unclear. She has not had atrial fib.

## 2014-12-13 ENCOUNTER — Ambulatory Visit (INDEPENDENT_AMBULATORY_CARE_PROVIDER_SITE_OTHER): Payer: Commercial Managed Care - HMO | Admitting: *Deleted

## 2014-12-13 DIAGNOSIS — I639 Cerebral infarction, unspecified: Secondary | ICD-10-CM

## 2014-12-18 NOTE — Progress Notes (Signed)
LOOP RECORDER  

## 2014-12-30 LAB — CUP PACEART REMOTE DEVICE CHECK: MDC IDC SESS DTM: 20161014043529

## 2014-12-30 NOTE — Progress Notes (Signed)
Carelink summary report received. Battery status OK. Normal device function. No new symptom episodes, tachy episodes, brady, or pause episodes. No new AF episodes. Monthly summary reports and ROV with GT in [place] in 11/2015.

## 2015-01-07 ENCOUNTER — Encounter: Payer: Self-pay | Admitting: Internal Medicine

## 2015-01-13 ENCOUNTER — Ambulatory Visit (INDEPENDENT_AMBULATORY_CARE_PROVIDER_SITE_OTHER): Payer: Commercial Managed Care - HMO | Admitting: *Deleted

## 2015-01-13 DIAGNOSIS — I639 Cerebral infarction, unspecified: Secondary | ICD-10-CM

## 2015-01-13 NOTE — Progress Notes (Signed)
Carelink summary report / loop recorder 

## 2015-02-11 ENCOUNTER — Ambulatory Visit (INDEPENDENT_AMBULATORY_CARE_PROVIDER_SITE_OTHER): Payer: Commercial Managed Care - HMO | Admitting: *Deleted

## 2015-02-11 DIAGNOSIS — I639 Cerebral infarction, unspecified: Secondary | ICD-10-CM | POA: Diagnosis not present

## 2015-02-11 NOTE — Progress Notes (Signed)
Carelink Summary Report / Loop Recorder 

## 2015-02-17 LAB — CUP PACEART REMOTE DEVICE CHECK: Date Time Interrogation Session: 20161113050536

## 2015-02-17 NOTE — Progress Notes (Signed)
Carelink summary report received. Battery status OK. Normal device function. No new symptom episodes, tachy episodes, brady, or pause episodes. No new AF episodes. V rate histograms suggest patient's median HR in 100-110bpm range. Monthly summary reports and ROV with GT in [place] in 11/2015.

## 2015-03-12 ENCOUNTER — Encounter: Payer: Self-pay | Admitting: Internal Medicine

## 2015-03-13 ENCOUNTER — Ambulatory Visit (INDEPENDENT_AMBULATORY_CARE_PROVIDER_SITE_OTHER): Payer: Commercial Managed Care - HMO | Admitting: *Deleted

## 2015-03-13 DIAGNOSIS — I639 Cerebral infarction, unspecified: Secondary | ICD-10-CM | POA: Diagnosis not present

## 2015-03-13 NOTE — Progress Notes (Signed)
Carelink Summary Report / Loop Recorder 

## 2015-03-29 LAB — CUP PACEART REMOTE DEVICE CHECK: MDC IDC SESS DTM: 20161213053629

## 2015-04-03 ENCOUNTER — Telehealth: Payer: Self-pay | Admitting: *Deleted

## 2015-04-03 NOTE — Telephone Encounter (Signed)
Attempted to reach patient regarding findings of atrial fibrillation on her Carelink transmissions.  [name] sister answered the phone and states she is patient's caregiver, but patient is asleep right now.  Advised patient's sister that I cannot disclose any information to her without patient's permission.  She requests that I call back later this afternoon (around 3) to speak with patient.  Advised patient's sister that I will call back.  She verbalizes understanding.

## 2015-04-04 NOTE — Telephone Encounter (Signed)
Able to reach patient, who requested I speak with her husband.  Updated patient and husband about findings of probable atrial fibrillation per Dr. Lovena Le.  [name] husband is agreeable to appointment with Dr. Lovena Le in [place] on 04/14/15 at 10:15am.  He is aware to call with questions or concerns in the interim and denies additional questions at this time.

## 2015-04-14 ENCOUNTER — Ambulatory Visit (INDEPENDENT_AMBULATORY_CARE_PROVIDER_SITE_OTHER): Payer: Commercial Managed Care - HMO | Admitting: Internal Medicine

## 2015-04-14 ENCOUNTER — Ambulatory Visit (INDEPENDENT_AMBULATORY_CARE_PROVIDER_SITE_OTHER): Payer: Commercial Managed Care - HMO | Admitting: *Deleted

## 2015-04-14 ENCOUNTER — Encounter: Payer: Self-pay | Admitting: Internal Medicine

## 2015-04-14 VITALS — BP 150/80 | HR 87 | Ht 65.0 in

## 2015-04-14 DIAGNOSIS — I639 Cerebral infarction, unspecified: Secondary | ICD-10-CM

## 2015-04-14 LAB — CUP PACEART INCLINIC DEVICE CHECK: MDC IDC SESS DTM: 20170213145227

## 2015-04-14 NOTE — Progress Notes (Signed)
HPI Mrs. Rummel returns today after a long absence from our arrhythmia clinic. She is a very pleasant 65 yo woman who has sustained 3 strokes of unclear etiology. After her last stroke, she had an ILR placed. She returns for followup. The patient has struggled with ambulation as she has a very dense hemiparesis. She has stopped going to physical therapy. She is unable to ambulate and is fearful of falling. She has rare palpitations. Her ILR in January demonstrated 4 minutes of an arrhythmias which could be atrial fib and she returns for followup.  Allergies  Allergen Reactions  . Hydrochlorothiazide Nausea And Vomiting     Current Outpatient Prescriptions  Medication Sig Dispense Refill  . amLODipine (NORVASC) 10 MG tablet Take 10 mg by mouth daily.    . cloNIDine (CATAPRES) 0.1 MG tablet Take 0.1 mg by mouth 2 (two) times daily.    . clopidogrel (PLAVIX) 75 MG tablet Take 75 mg by mouth daily.    . hydrALAZINE (APRESOLINE) 50 MG tablet Take 1 tablet (50 mg total) by mouth 4 (four) times daily. 270 tablet 3  . metformin (FORTAMET) 500 MG (OSM) 24 hr tablet Take 500 mg by mouth daily with breakfast.    . simvastatin (ZOCOR) 10 MG tablet Take 1 tablet (10 mg total) by mouth daily at 6 PM. 30 tablet 1  . venlafaxine XR (EFFEXOR-XR) 150 MG 24 hr capsule Take 150 mg by mouth daily with breakfast.     No current facility-administered medications for this visit.     Past Medical History  Diagnosis Date  . Essential hypertension, benign   . Fibromyalgia   . Cerebral infarction [facility]) Aug 2014    Bihemispheric watershed infarcts  . Mixed hyperlipidemia   . Borderline diabetes   . Urinary incontinence   . Carotid artery occlusion     Occluded RICA, status post left CEA  August 2014 - Dr. Donnetta Hutching  . CKD (chronic kidney disease) stage 3, GFR 30-59 ml/min   . 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 Closed dislocation of left humerus 07/26/2013  . Multiple  gastric ulcers     ROS:   All systems reviewed and negative except as noted in the HPI.   Past Surgical History  Procedure Laterality Date  . Urethral dilation  1980's  . Combined hysterectomy vaginal w/ mmk / a&p repair  1981  . Vaginal hysterectomy  1981    "partial" (10/04/2012)  . Endarterectomy Left 10/06/2012    Procedure: Carotid Endarterectomy with Finesse patch angioplasty;  Surgeon: [name], MD;  Location: [place];  Service: Vascular;  Laterality: Left;  . Loop recorder implant  04/16/13    MDT LinQ implanted for cryptogenic stroke  . [name] without cardioversion N/A 04/16/2013    Procedure: TRANSESOPHAGEAL ECHOCARDIOGRAM ([name]);  Surgeon: Josue Hector, MD;  Location[facility] ENDOSCOPY;  Service: Cardiovascular;  Laterality: N/A;  . Esophagogastroduodenoscopy N/A 04/26/2014    Procedure: ESOPHAGOGASTRODUODENOSCOPY (EGD);  Surgeon: Lear Ng, MD;  Location: [facility] ENDOSCOPY;  Service: Endoscopy;  Laterality: N/A;     Family History  Problem Relation Age of Onset  . Diabetes Mother   . Diabetes Father   . Hypertension Sister   . Diabetes Brother   . Seizures Son   . Kidney disease Maternal Grandmother   . Hypertension Maternal Grandmother   . Heart disease Maternal Grandfather   . Diabetes Paternal Grandmother   . Heart disease Paternal Grandfather  Social History   Social History  . Marital Status: Married    Spouse Name: N/A  . Number of Children: N/A  . Years of Education: N/A   Occupational History  . Not on file.   Social History Main Topics  . Smoking status: Former Smoker -- 1.00 packs/day for 44 years    Types: Cigarettes    Quit date: 10/03/2012  . Smokeless tobacco: Never Used  . Alcohol Use: No  . Drug Use: No  . Sexual Activity: Yes    Birth Control/ Protection: Surgical   Other Topics Concern  . Not on file   Social History Narrative     BP 150/80 mmHg  Pulse 87  Ht 5\' 5"  (1.651 m)  Wt   SpO2 97%  Physical  Exam:  Chronically ill appearing NAD HEENT: Unremarkable Neck:  No JVD, no thyromegally Lymphatics:  No adenopathy Back:  No CVA tenderness Lungs:  Clear with no wheezes HEART:  Regular rate rhythm, no murmurs, no rubs, no clicks Abd:  soft, positive bowel sounds, no organomegally, no rebound, no guarding Ext:  2 plus pulses, no edema, no cyanosis, no clubbing Skin:  No rashes no nodules Neuro:  CN II through XII intact, motor grossly intact except for dense left hemiparesis   DEVICE  Normal device function.  See PaceArt for details. One episode of an irregular tachy lasting 4 minutes  Assess/Plan: 1. Multiple strokes - etiology is unclear. Her ILR shows what might be atrial fib which has taken place almost 2 years from her her last neuro event. I will review her tracings with my partners to get a consensus. I am leaning toward systemic anti-coagulation with Eliquis. She is off of her ASA because of GI distress 2. HTN - her blood pressure today remains elevated. She will continue her current meds 3. Left hemiparesis - she is considering more rehab 4. Carotid stenosis - she is pending a visit with Dr. Donnetta Hutching.  Mikle Bosworth.D.

## 2015-04-14 NOTE — Patient Instructions (Addendum)
Your physician wants you to follow-up in: 6 Months with Dr. Taylor. You will receive a reminder letter in the mail two months in advance. If you don't receive a letter, please call our office to schedule the follow-up appointment.  Your physician recommends that you continue on your current medications as directed. Please refer to the Current Medication list given to you today.  If you need a refill on your cardiac medications before your next appointment, please call your pharmacy.  Thank you for choosing [facility]!   

## 2015-04-14 NOTE — Progress Notes (Signed)
Carelink Summary Report / Loop Recorder 

## 2015-04-28 ENCOUNTER — Encounter: Payer: Self-pay | Admitting: Internal Medicine

## 2015-05-08 LAB — CUP PACEART REMOTE DEVICE CHECK: MDC IDC SESS DTM: 20170112060834

## 2015-05-08 NOTE — Progress Notes (Signed)
Carelink summary report received. Battery status OK. Normal device function. No new symptom episodes, tachy episodes, brady, or pause episodes. 1 AF episode 4 minute duration. Available ECG appears SR with PAC's. Monthly summary reports and ROV/PRN

## 2015-05-12 ENCOUNTER — Ambulatory Visit (INDEPENDENT_AMBULATORY_CARE_PROVIDER_SITE_OTHER): Payer: Commercial Managed Care - HMO | Admitting: *Deleted

## 2015-05-12 DIAGNOSIS — I639 Cerebral infarction, unspecified: Secondary | ICD-10-CM | POA: Diagnosis not present

## 2015-05-12 NOTE — Progress Notes (Signed)
Carelink Summary Report / Loop Recorder 

## 2015-05-14 ENCOUNTER — Telehealth: Payer: Self-pay | Admitting: *Deleted

## 2015-05-14 NOTE — Telephone Encounter (Signed)
Spoke with patient and let her know what Dr Lovena Le said in regards to her LINQ transmission.  She verbalized understanding.

## 2015-05-14 NOTE — Telephone Encounter (Signed)
Called patient per Dr Lovena Le to let her know he reviewed LINQ interrogation and did not feel she was having afib.  He also feels she is not a good candidate for a blood thinner.  Her sister answered the phone and she is not up yet.  I let her know I would call her back

## 2015-06-11 ENCOUNTER — Ambulatory Visit (INDEPENDENT_AMBULATORY_CARE_PROVIDER_SITE_OTHER): Payer: Commercial Managed Care - HMO | Admitting: *Deleted

## 2015-06-11 DIAGNOSIS — I639 Cerebral infarction, unspecified: Secondary | ICD-10-CM

## 2015-06-11 NOTE — Progress Notes (Signed)
Carelink Summary Report / Loop Recorder 

## 2015-06-18 ENCOUNTER — Encounter: Payer: Self-pay | Admitting: Vascular Surgery

## 2015-06-22 LAB — CUP PACEART REMOTE DEVICE CHECK: Date Time Interrogation Session: 20170211063759

## 2015-06-22 NOTE — Progress Notes (Signed)
Carelink summary report received. Battery status OK. Normal device function. No new symptom episodes, tachy episodes, brady, or pause episodes. No new AF episodes. Monthly summary reports and ROV/PRN 

## 2015-06-24 ENCOUNTER — Ambulatory Visit: Payer: Commercial Managed Care - HMO | Admitting: Vascular Surgery

## 2015-06-24 ENCOUNTER — Ambulatory Visit (HOSPITAL_COMMUNITY): Payer: Commercial Managed Care - HMO

## 2015-07-11 ENCOUNTER — Ambulatory Visit (INDEPENDENT_AMBULATORY_CARE_PROVIDER_SITE_OTHER): Payer: Commercial Managed Care - HMO | Admitting: *Deleted

## 2015-07-11 DIAGNOSIS — I639 Cerebral infarction, unspecified: Secondary | ICD-10-CM | POA: Diagnosis not present

## 2015-07-11 NOTE — Progress Notes (Signed)
Carelink Summary Report / Loop Recorder 

## 2015-07-23 LAB — CUP PACEART REMOTE DEVICE CHECK: MDC IDC SESS DTM: 20170313070745

## 2015-07-27 LAB — CUP PACEART REMOTE DEVICE CHECK: Date Time Interrogation Session: 20170412073615

## 2015-07-27 NOTE — Progress Notes (Signed)
Carelink summary report received. Battery status OK. Normal device function. No new symptom episodes, tachy episodes, brady, or pause episodes. No new AF episodes. Monthly summary reports and ROV/PRN 

## 2015-08-11 ENCOUNTER — Encounter: Payer: Self-pay | Admitting: Internal Medicine

## 2015-08-11 ENCOUNTER — Ambulatory Visit (INDEPENDENT_AMBULATORY_CARE_PROVIDER_SITE_OTHER): Payer: Commercial Managed Care - HMO | Admitting: *Deleted

## 2015-08-11 DIAGNOSIS — I639 Cerebral infarction, unspecified: Secondary | ICD-10-CM

## 2015-08-11 NOTE — Progress Notes (Signed)
Carelink Summary Report / Loop Recorder 

## 2015-08-15 ENCOUNTER — Telehealth: Payer: Self-pay | Admitting: *Deleted

## 2015-08-15 LAB — CUP PACEART REMOTE DEVICE CHECK
Date Time Interrogation Session: 20170512080640
MDC IDC SESS DTM: 20170611083530

## 2015-08-15 NOTE — Telephone Encounter (Signed)
Called patient regarding tachy episode on LINQ from 08/08/15 (transmitted on 08/11/15).  Patient reports that she was asymptomatic with episode.  Advised that I will review with Dr. Lovena Le when he is back in the office next week and call her back if he has any additional recommendations.  Patient verbalizes understanding and denies additional questions or concerns at this time.

## 2015-08-26 NOTE — Telephone Encounter (Signed)
Episode ECG in Dr. Macon Large folder for review.

## 2015-09-09 ENCOUNTER — Ambulatory Visit (INDEPENDENT_AMBULATORY_CARE_PROVIDER_SITE_OTHER): Payer: Commercial Managed Care - HMO | Admitting: *Deleted

## 2015-09-09 DIAGNOSIS — I639 Cerebral infarction, unspecified: Secondary | ICD-10-CM | POA: Diagnosis not present

## 2015-09-09 NOTE — Progress Notes (Signed)
Carelink Summary Report / Loop Recorder 

## 2015-09-16 ENCOUNTER — Ambulatory Visit (HOSPITAL_COMMUNITY)
Admission: RE | Admit: 2015-09-16 | Discharge: 2015-09-16 | Disposition: A | Discharge status: Home / Self Care | Payer: Commercial Managed Care - HMO | Source: Ambulatory Visit | Attending: Vascular Surgery | Admitting: Vascular Surgery

## 2015-09-16 ENCOUNTER — Ambulatory Visit: Payer: Commercial Managed Care - HMO | Admitting: Vascular Surgery

## 2015-09-16 DIAGNOSIS — I6521 Occlusion and stenosis of right carotid artery: Secondary | ICD-10-CM

## 2015-09-16 DIAGNOSIS — I6522 Occlusion and stenosis of left carotid artery: Secondary | ICD-10-CM

## 2015-09-19 ENCOUNTER — Other Ambulatory Visit: Payer: Self-pay | Admitting: Vascular Surgery

## 2015-09-19 DIAGNOSIS — I6523 Occlusion and stenosis of bilateral carotid arteries: Secondary | ICD-10-CM

## 2015-10-07 LAB — CUP PACEART REMOTE DEVICE CHECK: Date Time Interrogation Session: 20170711090625

## 2015-10-09 ENCOUNTER — Ambulatory Visit (INDEPENDENT_AMBULATORY_CARE_PROVIDER_SITE_OTHER): Payer: Commercial Managed Care - HMO | Admitting: *Deleted

## 2015-10-09 DIAGNOSIS — I639 Cerebral infarction, unspecified: Secondary | ICD-10-CM | POA: Diagnosis not present

## 2015-10-09 NOTE — Progress Notes (Signed)
Carelink Summary Report / Loop Recorder 

## 2015-10-30 LAB — CUP PACEART REMOTE DEVICE CHECK: MDC IDC SESS DTM: 20170810093529

## 2015-10-30 NOTE — Progress Notes (Signed)
Carelink summary report received. Battery status OK. Normal device function. No new symptom episodes, tachy episodes, brady, or pause episodes. No new AF episodes. Monthly summary reports and ROV/PRN 

## 2015-11-10 ENCOUNTER — Ambulatory Visit (INDEPENDENT_AMBULATORY_CARE_PROVIDER_SITE_OTHER): Payer: Commercial Managed Care - HMO | Admitting: *Deleted

## 2015-11-10 DIAGNOSIS — I639 Cerebral infarction, unspecified: Secondary | ICD-10-CM | POA: Diagnosis not present

## 2015-11-10 NOTE — Progress Notes (Signed)
Carelink Summary Report / Loop Recorder 

## 2015-11-18 ENCOUNTER — Encounter: Payer: Self-pay | Admitting: Vascular Surgery

## 2015-11-25 ENCOUNTER — Ambulatory Visit: Payer: Commercial Managed Care - HMO | Admitting: Vascular Surgery

## 2015-11-25 ENCOUNTER — Ambulatory Visit (HOSPITAL_COMMUNITY): Payer: Commercial Managed Care - HMO

## 2015-12-06 LAB — CUP PACEART REMOTE DEVICE CHECK: Date Time Interrogation Session: 20170909093522

## 2015-12-06 NOTE — Progress Notes (Signed)
Carelink summary report received. Battery status OK. Normal device function. No new symptom episodes, tachy episodes, brady, or pause episodes. No new AF episodes. Histograms right shifted. Monthly summary reports and ROV/PRN

## 2015-12-08 ENCOUNTER — Ambulatory Visit (INDEPENDENT_AMBULATORY_CARE_PROVIDER_SITE_OTHER): Payer: Commercial Managed Care - HMO | Admitting: *Deleted

## 2015-12-08 DIAGNOSIS — I639 Cerebral infarction, unspecified: Secondary | ICD-10-CM

## 2015-12-08 NOTE — Progress Notes (Signed)
Carelink Summary Report / Loop Recorder 

## 2015-12-25 ENCOUNTER — Encounter: Payer: Self-pay | Admitting: Vascular Surgery

## 2015-12-30 ENCOUNTER — Encounter (HOSPITAL_COMMUNITY): Payer: Commercial Managed Care - HMO

## 2015-12-30 ENCOUNTER — Ambulatory Visit: Payer: Commercial Managed Care - HMO | Admitting: Vascular Surgery

## 2016-01-07 ENCOUNTER — Ambulatory Visit (INDEPENDENT_AMBULATORY_CARE_PROVIDER_SITE_OTHER): Payer: Commercial Managed Care - HMO | Admitting: *Deleted

## 2016-01-07 DIAGNOSIS — I639 Cerebral infarction, unspecified: Secondary | ICD-10-CM | POA: Diagnosis not present

## 2016-01-07 NOTE — Progress Notes (Signed)
Carelink Summary Report / Loop Recorder 

## 2016-01-11 LAB — CUP PACEART REMOTE DEVICE CHECK
Date Time Interrogation Session: 20171009113535
MDC IDC PG IMPLANT DT: 20150216

## 2016-01-11 NOTE — Progress Notes (Signed)
Carelink summary report received. Battery status OK. Normal device function. No new symptom episodes, tachy episodes, brady, or pause episodes. No new AF episodes. Monthly summary reports and ROV/PRN 

## 2016-02-04 ENCOUNTER — Encounter: Payer: Self-pay | Admitting: Vascular Surgery

## 2016-02-06 ENCOUNTER — Ambulatory Visit (INDEPENDENT_AMBULATORY_CARE_PROVIDER_SITE_OTHER): Payer: Commercial Managed Care - HMO | Admitting: *Deleted

## 2016-02-06 DIAGNOSIS — I639 Cerebral infarction, unspecified: Secondary | ICD-10-CM | POA: Diagnosis not present

## 2016-02-09 NOTE — Progress Notes (Signed)
Carelink Summary Report / Loop Recorder 

## 2016-02-10 ENCOUNTER — Ambulatory Visit: Payer: Commercial Managed Care - HMO | Admitting: Vascular Surgery

## 2016-02-10 ENCOUNTER — Encounter (HOSPITAL_COMMUNITY): Payer: Commercial Managed Care - HMO

## 2016-02-21 LAB — CUP PACEART REMOTE DEVICE CHECK
Date Time Interrogation Session: 20171108123827
Implantable Pulse Generator Implant Date: 20150216

## 2016-02-21 NOTE — Progress Notes (Signed)
Carelink summary report received. Battery status OK. Normal device function. No new symptom episodes, tachy episodes, brady, or pause episodes. No new AF episodes. Monthly summary reports and ROV/PRN 

## 2016-03-08 ENCOUNTER — Ambulatory Visit (INDEPENDENT_AMBULATORY_CARE_PROVIDER_SITE_OTHER): Payer: Commercial Managed Care - HMO | Admitting: *Deleted

## 2016-03-08 DIAGNOSIS — I639 Cerebral infarction, unspecified: Secondary | ICD-10-CM | POA: Diagnosis not present

## 2016-03-08 NOTE — Progress Notes (Signed)
Carelink Summary Report / Loop Recorder 

## 2016-03-27 LAB — CUP PACEART REMOTE DEVICE CHECK
Implantable Pulse Generator Implant Date: 20150216
MDC IDC SESS DTM: 20171208131015

## 2016-03-27 NOTE — Progress Notes (Signed)
Carelink summary report received. Battery status OK. Normal device function. No new symptom episodes, tachy episodes, brady, or pause episodes. No new AF episodes. Monthly summary reports and ROV/PRN 

## 2016-04-06 ENCOUNTER — Ambulatory Visit (INDEPENDENT_AMBULATORY_CARE_PROVIDER_SITE_OTHER): Payer: Commercial Managed Care - HMO | Admitting: *Deleted

## 2016-04-06 DIAGNOSIS — I639 Cerebral infarction, unspecified: Secondary | ICD-10-CM

## 2016-04-06 NOTE — Progress Notes (Signed)
Carelink Summary Report / Loop Recorder 

## 2016-04-20 LAB — CUP PACEART REMOTE DEVICE CHECK
Date Time Interrogation Session: 20180107131133
MDC IDC PG IMPLANT DT: 20150216

## 2016-04-29 LAB — CUP PACEART REMOTE DEVICE CHECK
Date Time Interrogation Session: 20180206131805
MDC IDC PG IMPLANT DT: 20150216

## 2016-04-29 NOTE — Progress Notes (Signed)
Carelink summary report received. Battery status OK. Normal device function. No new symptom episodes, tachy episodes, brady, or pause episodes. No new AF episodes. Monthly summary reports and ROV/PRN 

## 2016-05-06 ENCOUNTER — Ambulatory Visit (INDEPENDENT_AMBULATORY_CARE_PROVIDER_SITE_OTHER): Payer: Commercial Managed Care - HMO | Admitting: *Deleted

## 2016-05-06 DIAGNOSIS — I639 Cerebral infarction, unspecified: Secondary | ICD-10-CM | POA: Diagnosis not present

## 2016-05-06 NOTE — Progress Notes (Signed)
Carelink Summary Report / Loop Recorder 

## 2016-05-19 ENCOUNTER — Telehealth: Payer: Self-pay | Admitting: *Deleted

## 2016-05-19 NOTE — Telephone Encounter (Signed)
Spoke with patient and husband.  Assisted with sending a manual Carelink transmission for review of episodes.  "AF" episodes all false, show SR w/PACs and occasional PVCs.  [name] husband is aware of patient's appointment with Dr. Lovena Le on 08/19/16 at 10:30am at the [facility] office.  He is appreciative of call and denies additional questions or concerns at this time.

## 2016-05-20 LAB — CUP PACEART REMOTE DEVICE CHECK
Date Time Interrogation Session: 20180308130845
MDC IDC PG IMPLANT DT: 20150216

## 2016-06-03 DIAGNOSIS — E1165 Type 2 diabetes mellitus with hyperglycemia: Secondary | ICD-10-CM | POA: Diagnosis not present

## 2016-06-03 DIAGNOSIS — E1122 Type 2 diabetes mellitus with diabetic chronic kidney disease: Secondary | ICD-10-CM | POA: Diagnosis not present

## 2016-06-03 DIAGNOSIS — M2669 Other specified disorders of temporomandibular joint: Secondary | ICD-10-CM | POA: Diagnosis not present

## 2016-06-03 DIAGNOSIS — N184 Chronic kidney disease, stage 4 (severe): Secondary | ICD-10-CM | POA: Diagnosis not present

## 2016-06-07 ENCOUNTER — Ambulatory Visit (INDEPENDENT_AMBULATORY_CARE_PROVIDER_SITE_OTHER): Payer: Commercial Managed Care - HMO | Admitting: *Deleted

## 2016-06-07 DIAGNOSIS — I639 Cerebral infarction, unspecified: Secondary | ICD-10-CM

## 2016-06-07 NOTE — Progress Notes (Signed)
Carelink Summary Report / Loop Recorder 

## 2016-06-15 LAB — CUP PACEART REMOTE DEVICE CHECK
Date Time Interrogation Session: 20180407134023
Implantable Pulse Generator Implant Date: 20150216

## 2016-06-15 NOTE — Progress Notes (Signed)
Carelink summary report received. Battery status OK. Normal device function. No new symptom episodes, tachy episodes, brady, or pause episodes. 4 AF 0% burden. Available ECGs appear SR/ST w/ PACs, PVC also noted.  Monthly summary reports and ROV/PRN

## 2016-06-22 ENCOUNTER — Encounter: Payer: Self-pay | Admitting: Internal Medicine

## 2016-07-05 ENCOUNTER — Ambulatory Visit (INDEPENDENT_AMBULATORY_CARE_PROVIDER_SITE_OTHER): Payer: Commercial Managed Care - HMO | Admitting: *Deleted

## 2016-07-05 DIAGNOSIS — I639 Cerebral infarction, unspecified: Secondary | ICD-10-CM

## 2016-07-06 NOTE — Progress Notes (Signed)
Carelink Summary Report / Loop Recorder 

## 2016-07-16 ENCOUNTER — Encounter: Payer: Self-pay | Admitting: Internal Medicine

## 2016-07-16 ENCOUNTER — Telehealth: Payer: Self-pay | Admitting: *Deleted

## 2016-07-16 LAB — CUP PACEART REMOTE DEVICE CHECK
Implantable Pulse Generator Implant Date: 20150216
MDC IDC SESS DTM: 20180507133944
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
## 2016-07-22 NOTE — Telephone Encounter (Signed)
Spoke with patient, advised that manual transmission still hasn't been received.  She states that she will have her husband try sending one again tonight as he is not home right now.  She is appreciative and denies additional questions or concerns at this time.

## 2016-07-23 NOTE — Telephone Encounter (Signed)
Advised patient that manual transmission has still not been received.  She reports that her husband sent one again this AM.  Unsure of the issue, but will plan to have ILR checked one last time at appointment with Dr. Lovena Le on 08/19/16.  Return kit ordered, unenrolled from [place].  Patient appreciative and denies additional questions or concerns at this time.

## 2016-08-18 ENCOUNTER — Encounter: Payer: Self-pay | Admitting: *Deleted

## 2016-08-19 ENCOUNTER — Ambulatory Visit (INDEPENDENT_AMBULATORY_CARE_PROVIDER_SITE_OTHER): Payer: 59 | Admitting: Internal Medicine

## 2016-08-19 ENCOUNTER — Encounter: Payer: Self-pay | Admitting: Internal Medicine

## 2016-08-19 ENCOUNTER — Encounter: Payer: Commercial Managed Care - HMO | Admitting: Internal Medicine

## 2016-08-19 VITALS — BP 158/78 | HR 84 | Ht 65.0 in

## 2016-08-19 DIAGNOSIS — I639 Cerebral infarction, unspecified: Secondary | ICD-10-CM

## 2016-08-19 LAB — CUP PACEART INCLINIC DEVICE CHECK
Date Time Interrogation Session: 20180621162908
MDC IDC PG IMPLANT DT: 20150216

## 2016-08-19 NOTE — Patient Instructions (Addendum)
Medication Instructions:    Your physician recommends that you continue on your current medications as directed. Please refer to the Current Medication list given to you today.  - If you need a refill on your cardiac medications before your next appointment, please call your pharmacy.   Labwork:  None ordered  Testing/Procedures:  None ordered  Follow-Up:  Your physician wants you to follow-up in: 1 year with Dr. Taylor.  You will receive a reminder letter in the mail two months in advance. If you don't receive a letter, please call our office to schedule the follow-up appointment.  Thank you for choosing [facility]!!          

## 2016-08-19 NOTE — Progress Notes (Signed)
HPI Kelli [name] returns today for ongoing followup of cryptogenic stroke, s/p ILR. She has not had any atrial fib. She has fairly high blood pressure. She cannot walk. She has almost no strength in her left arm and is not steady with her left leg. She has had som high blood pressure and notes peripheral edema. She admits to dietary indiscretion with both salt and soda's. She denies chest pain or sob. No syncope. Allergies  Allergen Reactions  . Hydrochlorothiazide Nausea And Vomiting     Current Outpatient Prescriptions  Medication Sig Dispense Refill  . amLODipine (NORVASC) 10 MG tablet Take 10 mg by mouth daily.    . cloNIDine (CATAPRES) 0.1 MG tablet Take 0.1 mg by mouth 2 (two) times daily.    . clopidogrel (PLAVIX) 75 MG tablet Take 75 mg by mouth daily.    . hydrALAZINE (APRESOLINE) 50 MG tablet Take 1 tablet (50 mg total) by mouth 4 (four) times daily. 270 tablet 3  . metformin (FORTAMET) 500 MG (OSM) 24 hr tablet Take 500 mg by mouth daily with breakfast.    . simvastatin (ZOCOR) 10 MG tablet Take 1 tablet (10 mg total) by mouth daily at 6 PM. 30 tablet 1  . venlafaxine XR (EFFEXOR-XR) 150 MG 24 hr capsule Take 150 mg by mouth daily with breakfast.     No current facility-administered medications for this visit.      Past Medical History:  Diagnosis Date  . Borderline diabetes   . Carotid artery occlusion    Occluded RICA, status post left CEA  August 2014 - Dr. Donnetta Hutching  . Cerebral infarction [facility]) Aug 2014   Bihemispheric watershed infarcts  . Cerebral infarction involving left cerebellar artery [facility]) Feb 2015  . CKD (chronic kidney disease) stage 3, GFR 30-59 ml/min   . Closed dislocation of left humerus 07/26/2013  . Essential hypertension, benign   . Fibromyalgia   . Mixed hyperlipidemia   . Multiple gastric ulcers   . Urinary incontinence     ROS:   All systems reviewed and negative except as noted in the HPI.   Past Surgical History:  Procedure  Laterality Date  . COMBINED HYSTERECTOMY VAGINAL W/ MMK / A&P REPAIR  1981  . ENDARTERECTOMY Left 10/06/2012   Procedure: Carotid Endarterectomy with Finesse patch angioplasty;  Surgeon: [name], MD;  Location: [place];  Service: Vascular;  Laterality: Left;  . ESOPHAGOGASTRODUODENOSCOPY N/A 04/26/2014   Procedure: ESOPHAGOGASTRODUODENOSCOPY (EGD);  Surgeon: Lear Ng, MD;  Location: [facility] ENDOSCOPY;  Service: Endoscopy;  Laterality: N/A;  . LOOP RECORDER IMPLANT  04/16/13   MDT LinQ implanted for cryptogenic stroke  . [name] WITHOUT CARDIOVERSION N/A 04/16/2013   Procedure: TRANSESOPHAGEAL ECHOCARDIOGRAM ([name]);  Surgeon: Josue Hector, MD;  Location: [facility] ENDOSCOPY;  Service: Cardiovascular;  Laterality: N/A;  . URETHRAL DILATION  1980's  . VAGINAL HYSTERECTOMY  1981   "partial" (10/04/2012)     Family History  Problem Relation Age of Onset  . Diabetes Mother   . Diabetes Father   . Hypertension Sister   . Diabetes Brother   . Seizures Son   . Kidney disease Maternal Grandmother   . Hypertension Maternal Grandmother   . Heart disease Maternal Grandfather   . Diabetes Paternal Grandmother   . Heart disease Paternal Grandfather      Social History   Social History  . Marital status: Married    Spouse name: N/A  . Number of children: N/A  .  Years of education: N/A   Occupational History  . Not on file.   Social History Main Topics  . Smoking status: Former Smoker    Packs/day: 1.00    Years: 44.00    Types: Cigarettes    Quit date: 10/03/2012  . Smokeless tobacco: Never Used  . Alcohol use No  . Drug use: No  . Sexual activity: Yes    Birth control/ protection: Surgical   Other Topics Concern  . Not on file   Social History Narrative  . No narrative on file     BP (!) 158/78   Pulse 84   Ht 5\' 5"  (1.651 m)   SpO2 98%   Physical Exam:  Chronically ill appearing 66 yo woman, NAD HEENT: Unremarkable Neck:  7 cm JVD, no thyromegally Lymphatics:  No  adenopathy Back:  No CVA tenderness Lungs:  Clear with no wheezes HEART:  Regular rate rhythm, no murmurs, no rubs, no clicks Abd:  soft, positive bowel sounds, no organomegally, no rebound, no guarding Ext:  2 plus pulses, 2+ edema, no cyanosis, no clubbing Skin:  No rashes no nodules Neuro:  CN II through XII intact, motor grossly intact except for dense left hemiparesis   DEVICE  ILR demonstrates no atrial fib. She has some Tw over sensing, and one episode of NSVT  Assess/Plan: 1. Multiple strokes - etiology is unclear. Her ILR does not show any clear evidence of atrial fib. She will continue her plavix. 2. HTN - her blood pressure today remains elevated. She will continue her current meds 3. Left hemiparesis - she is considering more rehab 4. Carotid stenosis - she is asymptomatic currently.  Mikle Bosworth.D.

## 2016-10-13 ENCOUNTER — Inpatient Hospital Stay (HOSPITAL_COMMUNITY)
Admission: EM | Admit: 2016-10-13 | Discharge: 2016-10-20 | DRG: 682 | Disposition: A | Discharge status: Home / Self Care | Payer: 59 | Attending: Internal Medicine | Admitting: Internal Medicine

## 2016-10-13 ENCOUNTER — Inpatient Hospital Stay (HOSPITAL_COMMUNITY): Payer: 59

## 2016-10-13 ENCOUNTER — Emergency Department (HOSPITAL_COMMUNITY): Payer: 59

## 2016-10-13 ENCOUNTER — Encounter (HOSPITAL_COMMUNITY): Payer: Self-pay | Admitting: *Deleted

## 2016-10-13 DIAGNOSIS — Z7401 Bed confinement status: Secondary | ICD-10-CM

## 2016-10-13 DIAGNOSIS — Z6833 Body mass index (BMI) 33.0-33.9, adult: Secondary | ICD-10-CM | POA: Diagnosis not present

## 2016-10-13 DIAGNOSIS — E1151 Type 2 diabetes mellitus with diabetic peripheral angiopathy without gangrene: Secondary | ICD-10-CM | POA: Diagnosis present

## 2016-10-13 DIAGNOSIS — R748 Abnormal levels of other serum enzymes: Secondary | ICD-10-CM | POA: Diagnosis not present

## 2016-10-13 DIAGNOSIS — D631 Anemia in chronic kidney disease: Secondary | ICD-10-CM | POA: Diagnosis present

## 2016-10-13 DIAGNOSIS — E782 Mixed hyperlipidemia: Secondary | ICD-10-CM | POA: Diagnosis present

## 2016-10-13 DIAGNOSIS — T82594A Other mechanical complication of infusion catheter, initial encounter: Secondary | ICD-10-CM

## 2016-10-13 DIAGNOSIS — I5032 Chronic diastolic (congestive) heart failure: Secondary | ICD-10-CM | POA: Diagnosis present

## 2016-10-13 DIAGNOSIS — I340 Nonrheumatic mitral (valve) insufficiency: Secondary | ICD-10-CM | POA: Diagnosis not present

## 2016-10-13 DIAGNOSIS — J9601 Acute respiratory failure with hypoxia: Secondary | ICD-10-CM | POA: Diagnosis not present

## 2016-10-13 DIAGNOSIS — E876 Hypokalemia: Secondary | ICD-10-CM | POA: Diagnosis not present

## 2016-10-13 DIAGNOSIS — I509 Heart failure, unspecified: Secondary | ICD-10-CM

## 2016-10-13 DIAGNOSIS — N170 Acute kidney failure with tubular necrosis: Secondary | ICD-10-CM | POA: Diagnosis not present

## 2016-10-13 DIAGNOSIS — Z87891 Personal history of nicotine dependence: Secondary | ICD-10-CM | POA: Diagnosis not present

## 2016-10-13 DIAGNOSIS — M79606 Pain in leg, unspecified: Secondary | ICD-10-CM

## 2016-10-13 DIAGNOSIS — I69354 Hemiplegia and hemiparesis following cerebral infarction affecting left non-dominant side: Secondary | ICD-10-CM | POA: Diagnosis not present

## 2016-10-13 DIAGNOSIS — E669 Obesity, unspecified: Secondary | ICD-10-CM | POA: Diagnosis present

## 2016-10-13 DIAGNOSIS — N185 Chronic kidney disease, stage 5: Secondary | ICD-10-CM | POA: Diagnosis not present

## 2016-10-13 DIAGNOSIS — Z8711 Personal history of peptic ulcer disease: Secondary | ICD-10-CM

## 2016-10-13 DIAGNOSIS — Z7984 Long term (current) use of oral hypoglycemic drugs: Secondary | ICD-10-CM | POA: Diagnosis not present

## 2016-10-13 DIAGNOSIS — I5033 Acute on chronic diastolic (congestive) heart failure: Secondary | ICD-10-CM | POA: Diagnosis present

## 2016-10-13 DIAGNOSIS — E872 Acidosis: Secondary | ICD-10-CM | POA: Diagnosis present

## 2016-10-13 DIAGNOSIS — I2510 Atherosclerotic heart disease of native coronary artery without angina pectoris: Secondary | ICD-10-CM | POA: Diagnosis present

## 2016-10-13 DIAGNOSIS — E1122 Type 2 diabetes mellitus with diabetic chronic kidney disease: Secondary | ICD-10-CM | POA: Diagnosis present

## 2016-10-13 DIAGNOSIS — Z7902 Long term (current) use of antithrombotics/antiplatelets: Secondary | ICD-10-CM | POA: Diagnosis not present

## 2016-10-13 DIAGNOSIS — N059 Unspecified nephritic syndrome with unspecified morphologic changes: Secondary | ICD-10-CM | POA: Diagnosis present

## 2016-10-13 DIAGNOSIS — I10 Essential (primary) hypertension: Secondary | ICD-10-CM

## 2016-10-13 DIAGNOSIS — N2581 Secondary hyperparathyroidism of renal origin: Secondary | ICD-10-CM | POA: Diagnosis present

## 2016-10-13 DIAGNOSIS — N189 Chronic kidney disease, unspecified: Secondary | ICD-10-CM | POA: Diagnosis not present

## 2016-10-13 DIAGNOSIS — I248 Other forms of acute ischemic heart disease: Secondary | ICD-10-CM | POA: Diagnosis not present

## 2016-10-13 DIAGNOSIS — Z8673 Personal history of transient ischemic attack (TIA), and cerebral infarction without residual deficits: Secondary | ICD-10-CM

## 2016-10-13 DIAGNOSIS — Z950 Presence of cardiac pacemaker: Secondary | ICD-10-CM | POA: Diagnosis not present

## 2016-10-13 DIAGNOSIS — G8110 Spastic hemiplegia affecting unspecified side: Secondary | ICD-10-CM | POA: Diagnosis not present

## 2016-10-13 DIAGNOSIS — N178 Other acute kidney failure: Secondary | ICD-10-CM | POA: Diagnosis not present

## 2016-10-13 DIAGNOSIS — N183 Chronic kidney disease, stage 3 unspecified: Secondary | ICD-10-CM | POA: Diagnosis present

## 2016-10-13 DIAGNOSIS — I472 Ventricular tachycardia: Secondary | ICD-10-CM | POA: Diagnosis not present

## 2016-10-13 DIAGNOSIS — N179 Acute kidney failure, unspecified: Secondary | ICD-10-CM | POA: Diagnosis not present

## 2016-10-13 DIAGNOSIS — I130 Hypertensive heart and chronic kidney disease with heart failure and stage 1 through stage 4 chronic kidney disease, or unspecified chronic kidney disease: Secondary | ICD-10-CM | POA: Diagnosis present

## 2016-10-13 DIAGNOSIS — R072 Precordial pain: Secondary | ICD-10-CM

## 2016-10-13 DIAGNOSIS — R079 Chest pain, unspecified: Secondary | ICD-10-CM | POA: Diagnosis present

## 2016-10-13 DIAGNOSIS — K279 Peptic ulcer, site unspecified, unspecified as acute or chronic, without hemorrhage or perforation: Secondary | ICD-10-CM | POA: Diagnosis not present

## 2016-10-13 DIAGNOSIS — R0600 Dyspnea, unspecified: Secondary | ICD-10-CM

## 2016-10-13 DIAGNOSIS — D649 Anemia, unspecified: Secondary | ICD-10-CM | POA: Diagnosis not present

## 2016-10-13 DIAGNOSIS — I739 Peripheral vascular disease, unspecified: Secondary | ICD-10-CM | POA: Diagnosis present

## 2016-10-13 DIAGNOSIS — R7989 Other specified abnormal findings of blood chemistry: Secondary | ICD-10-CM

## 2016-10-13 DIAGNOSIS — Z79899 Other long term (current) drug therapy: Secondary | ICD-10-CM | POA: Diagnosis not present

## 2016-10-13 DIAGNOSIS — R778 Other specified abnormalities of plasma proteins: Secondary | ICD-10-CM

## 2016-10-13 DIAGNOSIS — M797 Fibromyalgia: Secondary | ICD-10-CM | POA: Diagnosis present

## 2016-10-13 DIAGNOSIS — D892 Hypergammaglobulinemia, unspecified: Secondary | ICD-10-CM | POA: Diagnosis present

## 2016-10-13 LAB — URINALYSIS, ROUTINE W REFLEX MICROSCOPIC
Bilirubin Urine: NEGATIVE
GLUCOSE, UA: 50 mg/dL — AB
Hgb urine dipstick: NEGATIVE
KETONES UR: NEGATIVE mg/dL
NITRITE: NEGATIVE
Protein, ur: 100 mg/dL — AB
SPECIFIC GRAVITY, URINE: 1.009 (ref 1.005–1.030)
pH: 5 (ref 5.0–8.0)

## 2016-10-13 LAB — FOLATE: Folate: 8.7 ng/mL (ref >5.9)

## 2016-10-13 LAB — CBC WITH DIFFERENTIAL/PLATELET
BASOS ABS: 0 10*3/uL (ref 0.0–0.1)
Basophils Relative: 0 %
EOS ABS: 0.3 10*3/uL (ref 0.0–0.7)
Eosinophils Relative: 2 %
HCT: 19.2 % — ABNORMAL LOW (ref 36.0–46.0)
HEMOGLOBIN: 6.3 g/dL — AB (ref 12.0–15.0)
LYMPHS PCT: 9 %
Lymphs Abs: 1.2 10*3/uL (ref 0.7–4.0)
MCH: 28 pg (ref 26.0–34.0)
MCHC: 32.8 g/dL (ref 30.0–36.0)
MCV: 85.3 fL (ref 78.0–100.0)
MONO ABS: 0.8 10*3/uL (ref 0.1–1.0)
Monocytes Relative: 6 %
NEUTROS PCT: 83 %
Neutro Abs: 11.5 10*3/uL — ABNORMAL HIGH (ref 1.7–7.7)
PLATELETS: 299 10*3/uL (ref 150–400)
RBC: 2.25 MIL/uL — ABNORMAL LOW (ref 3.87–5.11)
RDW: 14.9 % (ref 11.5–15.5)
WBC: 13.8 10*3/uL — AB (ref 4.0–10.5)

## 2016-10-13 LAB — PREPARE RBC (CROSSMATCH)

## 2016-10-13 LAB — GLUCOSE, CAPILLARY
GLUCOSE-CAPILLARY: 129 mg/dL — AB (ref 65–99)
GLUCOSE-CAPILLARY: 150 mg/dL — AB (ref 65–99)
Glucose-Capillary: 181 mg/dL — ABNORMAL HIGH (ref 65–99)
Glucose-Capillary: 165 mg/dL — ABNORMAL HIGH (ref 65–99)

## 2016-10-13 LAB — COMPREHENSIVE METABOLIC PANEL
ALK PHOS: 68 U/L (ref 38–126)
ALT: 11 U/L — AB (ref 14–54)
ANION GAP: 11 (ref 5–15)
AST: 13 U/L — ABNORMAL LOW (ref 15–41)
Albumin: 3.3 g/dL — ABNORMAL LOW (ref 3.5–5.0)
BUN: 39 mg/dL — ABNORMAL HIGH (ref 6–20)
CHLORIDE: 108 mmol/L (ref 101–111)
CO2: 16 mmol/L — ABNORMAL LOW (ref 22–32)
Calcium: 7.6 mg/dL — ABNORMAL LOW (ref 8.9–10.3)
Creatinine, Ser: 4.5 mg/dL — ABNORMAL HIGH (ref 0.44–1.00)
GFR, EST AFRICAN AMERICAN: 11 mL/min — AB (ref >60)
GFR, EST NON AFRICAN AMERICAN: 9 mL/min — AB (ref >60)
Glucose, Bld: 172 mg/dL — ABNORMAL HIGH (ref 65–99)
POTASSIUM: 3.5 mmol/L (ref 3.5–5.1)
SODIUM: 135 mmol/L (ref 135–145)
TOTAL PROTEIN: 6.9 g/dL (ref 6.5–8.1)
Total Bilirubin: 0.4 mg/dL (ref 0.3–1.2)

## 2016-10-13 LAB — TSH: TSH: 3.32 u[IU]/mL (ref 0.350–4.500)

## 2016-10-13 LAB — CBC
HEMATOCRIT: 20.8 % — AB (ref 36.0–46.0)
Hemoglobin: 6.9 g/dL — CL (ref 12.0–15.0)
MCH: 28.5 pg (ref 26.0–34.0)
MCHC: 33.2 g/dL (ref 30.0–36.0)
MCV: 86 fL (ref 78.0–100.0)
PLATELETS: 376 10*3/uL (ref 150–400)
RBC: 2.42 MIL/uL — AB (ref 3.87–5.11)
RDW: 15 % (ref 11.5–15.5)
WBC: 16.5 10*3/uL — ABNORMAL HIGH (ref 4.0–10.5)

## 2016-10-13 LAB — FERRITIN: Ferritin: 67 ng/mL (ref 11–307)

## 2016-10-13 LAB — TROPONIN I
TROPONIN I: 0.06 ng/mL — AB (ref <0.03)
TROPONIN I: 0.06 ng/mL — AB (ref <0.03)
TROPONIN I: 0.05 ng/mL — AB (ref <0.03)
Troponin I: 0.06 ng/mL (ref <0.03)

## 2016-10-13 LAB — IRON AND TIBC
Iron: 33 ug/dL (ref 28–170)
SATURATION RATIOS: 11 % (ref 10.4–31.8)
TIBC: 293 ug/dL (ref 250–450)
UIBC: 260 ug/dL

## 2016-10-13 LAB — RETICULOCYTES
RBC.: 2.26 MIL/uL — ABNORMAL LOW (ref 3.87–5.11)
RETIC CT PCT: 2.7 % (ref 0.4–3.1)
Retic Count, Absolute: 61 10*3/uL (ref 19.0–186.0)

## 2016-10-13 LAB — HEMOGLOBIN AND HEMATOCRIT, BLOOD
HEMATOCRIT: 26.7 % — AB (ref 36.0–46.0)
HEMOGLOBIN: 9.1 g/dL — AB (ref 12.0–15.0)

## 2016-10-13 LAB — VITAMIN B12: VITAMIN B 12: 213 pg/mL (ref 180–914)

## 2016-10-13 LAB — D-DIMER, QUANTITATIVE (NOT AT ARMC): D DIMER QUANT: 1.06 ug{FEU}/mL — AB (ref 0.00–0.50)

## 2016-10-13 LAB — CK: Total CK: 20 U/L — ABNORMAL LOW (ref 38–234)

## 2016-10-13 LAB — BRAIN NATRIURETIC PEPTIDE: B NATRIURETIC PEPTIDE 5: 383 pg/mL — AB (ref 0.0–100.0)

## 2016-10-13 LAB — POC OCCULT BLOOD, ED: FECAL OCCULT BLD: NEGATIVE

## 2016-10-13 MED ORDER — METOPROLOL TARTRATE 5 MG/5ML IV SOLN
5.0000 mg | INTRAVENOUS | Status: DC | PRN
  Administered 2016-10-13 – 2016-10-16 (×7): 5 mg via INTRAVENOUS
  Filled 2016-10-13 (×7): qty 5

## 2016-10-13 MED ORDER — FUROSEMIDE 10 MG/ML IJ SOLN
80.0000 mg | Freq: Once | INTRAMUSCULAR | Status: AC
  Administered 2016-10-13: 80 mg via INTRAVENOUS
  Filled 2016-10-13: qty 8

## 2016-10-13 MED ORDER — FUROSEMIDE 10 MG/ML IJ SOLN: 40.0000 mg | Freq: Two times a day (BID) | INTRAMUSCULAR | Status: DC

## 2016-10-13 MED ORDER — ASPIRIN 81 MG PO CHEW
81.0000 mg | CHEWABLE_TABLET | Freq: Every day | ORAL | Status: DC
  Administered 2016-10-14 – 2016-10-20 (×7): 81 mg via ORAL
  Filled 2016-10-13 (×8): qty 1

## 2016-10-13 MED ORDER — AMLODIPINE BESYLATE 5 MG PO TABS
10.0000 mg | ORAL_TABLET | Freq: Every day | ORAL | Status: DC
  Administered 2016-10-13 – 2016-10-20 (×8): 10 mg via ORAL
  Filled 2016-10-13 (×8): qty 2

## 2016-10-13 MED ORDER — NITROGLYCERIN 2 % TD OINT
1.0000 [in_us] | TOPICAL_OINTMENT | Freq: Three times a day (TID) | TRANSDERMAL | Status: DC
  Administered 2016-10-13 – 2016-10-20 (×23): 1 [in_us] via TOPICAL
  Filled 2016-10-13 (×24): qty 1

## 2016-10-13 MED ORDER — SODIUM CHLORIDE 0.9 % IV SOLN: Freq: Once | INTRAVENOUS | Status: AC

## 2016-10-13 MED ORDER — SODIUM CHLORIDE 0.9 % IV SOLN: Freq: Once | INTRAVENOUS | Status: DC

## 2016-10-13 MED ORDER — SODIUM BICARBONATE 650 MG PO TABS
650.0000 mg | ORAL_TABLET | Freq: Three times a day (TID) | ORAL | Status: DC
  Administered 2016-10-13 – 2016-10-20 (×22): 650 mg via ORAL
  Filled 2016-10-13 (×22): qty 1

## 2016-10-13 MED ORDER — HYDRALAZINE HCL 25 MG PO TABS
25.0000 mg | ORAL_TABLET | Freq: Two times a day (BID) | ORAL | Status: DC
  Administered 2016-10-13 – 2016-10-14 (×3): 25 mg via ORAL
  Filled 2016-10-13 (×3): qty 1

## 2016-10-13 MED ORDER — INSULIN ASPART 100 UNIT/ML ~~LOC~~ SOLN
0.0000 [IU] | Freq: Three times a day (TID) | SUBCUTANEOUS | Status: DC
  Administered 2016-10-13 (×2): 2 [IU] via SUBCUTANEOUS
  Administered 2016-10-13 – 2016-10-14 (×3): 1 [IU] via SUBCUTANEOUS
  Administered 2016-10-14 – 2016-10-16 (×5): 2 [IU] via SUBCUTANEOUS
  Administered 2016-10-16: 3 [IU] via SUBCUTANEOUS
  Administered 2016-10-16: 7 [IU] via SUBCUTANEOUS
  Administered 2016-10-17: 3 [IU] via SUBCUTANEOUS
  Administered 2016-10-17 – 2016-10-18 (×4): 2 [IU] via SUBCUTANEOUS
  Administered 2016-10-18: 3 [IU] via SUBCUTANEOUS
  Administered 2016-10-19: 1 [IU] via SUBCUTANEOUS
  Administered 2016-10-19: 2 [IU] via SUBCUTANEOUS
  Administered 2016-10-19: 1 [IU] via SUBCUTANEOUS
  Administered 2016-10-20: 2 [IU] via SUBCUTANEOUS
  Administered 2016-10-20: 1 [IU] via SUBCUTANEOUS

## 2016-10-13 MED ORDER — FUROSEMIDE 10 MG/ML IJ SOLN
120.0000 mg | Freq: Three times a day (TID) | INTRAVENOUS | Status: DC
  Administered 2016-10-13 – 2016-10-17 (×11): 120 mg via INTRAVENOUS
  Filled 2016-10-13 (×16): qty 12

## 2016-10-13 MED ORDER — ONDANSETRON HCL 4 MG/2ML IJ SOLN
4.0000 mg | Freq: Four times a day (QID) | INTRAMUSCULAR | Status: DC | PRN
  Administered 2016-10-13 – 2016-10-17 (×4): 4 mg via INTRAVENOUS
  Filled 2016-10-13 (×3): qty 2

## 2016-10-13 MED ORDER — SODIUM CHLORIDE 0.9% FLUSH
3.0000 mL | INTRAVENOUS | Status: DC | PRN
  Administered 2016-10-20: 10 mL via INTRAVENOUS
  Filled 2016-10-13: qty 3

## 2016-10-13 MED ORDER — NITROGLYCERIN 2 % TD OINT
1.0000 [in_us] | TOPICAL_OINTMENT | Freq: Once | TRANSDERMAL | Status: AC
  Administered 2016-10-13: 1 [in_us] via TOPICAL
  Filled 2016-10-13: qty 1

## 2016-10-13 MED ORDER — SODIUM CHLORIDE 0.9% FLUSH
3.0000 mL | Freq: Two times a day (BID) | INTRAVENOUS | Status: DC
  Administered 2016-10-13 – 2016-10-20 (×11): 3 mL via INTRAVENOUS

## 2016-10-13 MED ORDER — METOPROLOL SUCCINATE ER 50 MG PO TB24
50.0000 mg | ORAL_TABLET | Freq: Every day | ORAL | Status: DC
  Administered 2016-10-13 – 2016-10-17 (×5): 50 mg via ORAL
  Filled 2016-10-13 (×5): qty 1

## 2016-10-13 MED ORDER — ONDANSETRON HCL 4 MG/2ML IJ SOLN
INTRAMUSCULAR | Status: AC
  Filled 2016-10-13: qty 2

## 2016-10-13 MED ORDER — ATORVASTATIN CALCIUM 40 MG PO TABS
40.0000 mg | ORAL_TABLET | Freq: Every day | ORAL | Status: DC
  Administered 2016-10-14 – 2016-10-19 (×7): 40 mg via ORAL
  Filled 2016-10-13 (×6): qty 1

## 2016-10-13 MED ORDER — SODIUM CHLORIDE 0.9 % IV SOLN
250.0000 mL | INTRAVENOUS | Status: DC | PRN
  Administered 2016-10-18: 250 mL via INTRAVENOUS

## 2016-10-13 MED ORDER — ACETAMINOPHEN 325 MG PO TABS
650.0000 mg | ORAL_TABLET | Freq: Once | ORAL | Status: AC
Start: 2016-10-13 — End: 2016-10-13
  Administered 2016-10-13: 650 mg via ORAL
  Filled 2016-10-13: qty 2

## 2016-10-13 NOTE — ED Notes (Signed)
Date and time results received: 10/13/16 0445 (use smartphrase ".now" to insert current time)  Test: troponin Critical Value: 0.06  Name of Provider Notified: dr Tomi Bamberger  Orders Received? Or Actions Taken?: Actions Taken: no orders received

## 2016-10-13 NOTE — Progress Notes (Signed)
PROGRESS NOTE  Kelli [name] VFI:[id number] DOB: December 26, 1950 DOA: 10/13/2016 PCP: Manon Hilding, MD  Brief History:  66 year old female with a history of stroke with residual left hemiparesis, CKD stage III, essential hypertension, diabetes mellitus, peripheral vascular disease presented when she woke up with substernal chest discomfort in the early morning of 10/13/2016. The patient has some associated shortness of breath and diaphoresis. She denies any fevers, chills, headache, neck pain, dysuria, abdominal pain, hematochezia, melena. The patient states that she often feels nauseous when she first wakes up in the morning resulting in an episode of emesis. This has happened for many years. Upon presentation, the patient was noted to have hemoglobin of 6.3 and serum creatinine of 4.50. Chest x-ray showed central vascular congestion and interstitial edema. The patient was started on intravenous furosemide and admitted for further workup of her renal failure.  Assessment/Plan: Acute on chronic diastolic CHF -daily weights -accurate I/O's -fluid restrict -Echo -start IV Lasix 80 mg x 1 then 40 mg IV bid -cannot use ACEi due to renal failure -04/16/2013--Echo--EF 55-60%  Acute respiratory failure with hypoxia -due to CHF -stable on 3 L -wean oxygen for saturation >92%  Atypical chest pain/Elevated troponin -consult cardiology -pt also has loop recorder placed Feb 2015 after stroke -elevated troponin due to decompensated CHF  Acute on chronic renal failure--CKD stage 3 -baseline creatinine 1.2-1.5 but no recent BMP -last serum creatinine 1.49 on 04/29/2014 -I Contacted the patient's PCP--no blood work since 2015 in office record -renal US -monitor with diuresis -check CK  Anemia of CKD--symptomatic anemia -check anemia panel -transfused 1 unit -transfuse second unit -FOBT neg  Essential HTN-uncontrolled -continue metoprolol succinate -cannot use ACEi due to renal  failure -continue amlodipine and hydralazine -anticipate improvement with diuresis  Hx of Stroke with left hemiparesis  -continue plavix -check lipid panel -pt has loop recorder placed Feb 2015  Diabetes mellitus -d/c metformin altogether due to renal failure -check A1C  Hyperlipidemia -continue statin  Leg edema and pain -venous duplex     Disposition Plan:   Home in 2-3 days  Family Communication:   Spouse updated at bedside 8/15--Total time spent 35 minutes.  Greater than 50% spent face to face counseling and coordinating care. 0820 to [phone]   Consultants:  cardiology  Code Status:  FULL   DVT Prophylaxis:  [place] Heparin   Procedures: As Listed in Progress Note Above  Antibiotics: None    Subjective: Patient complaint shortness of breath. She has some chest discomfort. She feels that this is pressure-like moderate in intensity in the center of her chest. Denies any nausea, vomiting, diarrhea, abdominal pain, fevers, chills, headache, neck pain.  Objective: Vitals:   10/13/16 0430 10/13/16 0500 10/13/16 0513 10/13/16 0540  BP: (!) 202/87 (!) 188/87 (!) 192/83 (!) 186/78  Pulse:  85 86 80  Resp: 18 15 20 18   Temp:    98.4 F (36.9 C)  TempSrc:    Oral  SpO2:  95% 95% 94%  Weight:      Height:       No intake or output data in the 24 hours ending 10/13/16 0835 Weight change:  Exam:   General:  Pt is alert, follows commands appropriately, not in acute distress  HEENT: No icterus, No thrush, No neck mass, Gantt/AT  Cardiovascular: RRR, S1/S2, no rubs, no gallops  Respiratory: Bilateral crackles. Bibasilar wheeze.  Abdomen: Soft/+BS, non tender, non distended, no guarding  Extremities:  2 +LE edema, No lymphangitis, No petechiae, No rashes, no synovitis   Data Reviewed: I have personally reviewed following labs and imaging studies Basic Metabolic Panel:  Recent Labs Lab 10/13/16 0352  NA 135  K 3.5  CL 108  CO2 16*  GLUCOSE 172*  BUN 39*    CREATININE 4.50*  CALCIUM 7.6*   Liver Function Tests:  Recent Labs Lab 10/13/16 0352  AST 13*  ALT 11*  ALKPHOS 68  BILITOT 0.4  PROT 6.9  ALBUMIN 3.3*   No results for input(s): LIPASE, AMYLASE in the last 168 hours. No results for input(s): AMMONIA in the last 168 hours. Coagulation Profile: No results for input(s): INR, PROTIME in the last 168 hours. CBC:  Recent Labs Lab 10/13/16 0352  WBC 13.8*  NEUTROABS 11.5*  HGB 6.3*  HCT 19.2*  MCV 85.3  PLT 299   Cardiac Enzymes:  Recent Labs Lab 10/13/16 0352 10/13/16 0659  TROPONINI 0.06* 0.06*   BNP: Invalid input(s): POCBNP CBG:  Recent Labs Lab 10/13/16 0809  GLUCAP 181*   HbA1C: No results for input(s): HGBA1C in the last 72 hours. Urine analysis:    Component Value Date/Time   COLORURINE YELLOW 04/25/2014 [place] 04/25/2014 2315   LABSPEC 1.020 04/25/2014 2315   PHURINE 5.5 04/25/2014 2315   GLUCOSEU NEGATIVE 04/25/2014 2315   HGBUR NEGATIVE 04/25/2014 2315   BILIRUBINUR NEGATIVE 04/25/2014 2315   KETONESUR NEGATIVE 04/25/2014 2315   PROTEINUR 100 (A) 04/25/2014 2315   UROBILINOGEN 0.2 04/25/2014 2315   NITRITE NEGATIVE 04/25/2014 2315   LEUKOCYTESUR NEGATIVE 04/25/2014 2315   Sepsis Labs: @LABRCNTIP (procalcitonin:4,lacticidven:4) )No results found for this or any previous visit (from the past 240 hour(s)).   Scheduled Meds: . amLODipine  10 mg Oral Daily  . aspirin  81 mg Oral Daily  . atorvastatin  40 mg Oral q1800  . furosemide  80 mg Intravenous Once  . insulin aspart  0-9 Units Subcutaneous TID WC  . metoprolol succinate  50 mg Oral Daily  . nitroGLYCERIN  1 inch Topical Q8H  . sodium chloride flush  3 mL Intravenous Q12H   Continuous Infusions: . sodium chloride      Procedures/Studies: Dg Chest Port 1 View  Result Date: 10/13/2016 CLINICAL DATA:  Shortness of breath and chest tightness EXAM: PORTABLE CHEST 1 VIEW COMPARISON:  04/26/2014 FINDINGS:  Moderate cardiomegaly with globular cardiac configuration, increased compared to prior radiograph. Central vascular congestion and mild interstitial perihilar edema. No pleural effusion. Aortic atherosclerosis. No pneumothorax. Electronic device over the left chest. IMPRESSION: Moderate cardiomegaly with globular cardiac configuration, findings could be secondary to multi chamber enlargement or pericardial effusion. There is central vascular congestion and mild perihilar edema. Electronically Signed   By: Donavan Foil M.D.   On: 10/13/2016 03:25    Lyrick Lagrand, DO  [facility] Pager [phone]  If 7PM-7AM, please contact night-coverage [email or web address] Password [facility] 10/13/2016, 8:35 AM   LOS: 0 days

## 2016-10-13 NOTE — ED Triage Notes (Addendum)
Pt brought in by [facility] for c/o sob and chest pain with lying down; pt describes the pain as a tightness; pt has bilateral swelling to feet and lower legs

## 2016-10-13 NOTE — Consult Note (Signed)
Kelli [name] MRN: [id number] DOB/AGE: 08-29-1950 66 y.o. Primary Care Physician:[name], [name], MD Admit date: 10/13/2016 Chief Complaint:  Chief Complaint  Patient presents with  . Shortness of Breath   HPI: Pt is a 66 year old  female with past medical history significant for HTN , DM,  CVA with left hemiparesis, who came to ER with c/o Dyspnea.  HPI dates back to past few days when pt started feeling short of breath. Pt also complained of chest pain at the time of presentation.  Chest pain was associated with diaphoresis.   Pt seen today on 3rd floor. Pt says " I am still having chest pain"  Pt offer no c/o  Fevers/cough/chills.  Pt also c/o chronic bilateral Lower ext swelling. NO c/o Emesis/melena but does c/o nausea NO c/o  any epigastric pain.   Pt was admitted for uncontrolled HTN and Dyspnea.    Past Medical History:  Diagnosis Date  . Borderline diabetes   . Carotid artery occlusion    Occluded RICA, status post left CEA  August 2014 - Dr. Donnetta Hutching  . Cerebral infarction [facility]) Aug 2014   Bihemispheric watershed infarcts  . Cerebral infarction involving left cerebellar artery [facility]) Feb 2015  . CKD (chronic kidney disease) stage 3, GFR 30-59 ml/min   . Closed dislocation of left humerus 07/26/2013  . Essential hypertension, benign   . Fibromyalgia   . Mixed hyperlipidemia   . Multiple gastric ulcers   . Urinary incontinence         Family History  Problem Relation Age of Onset  . Diabetes Mother   . Diabetes Father   . Hypertension Sister   . Diabetes Brother   . Seizures Son   . Kidney disease Maternal Grandmother   . Hypertension Maternal Grandmother   . Heart disease Maternal Grandfather   . Diabetes Paternal Grandmother   . Heart disease Paternal Grandfather   Pt father was on HD.  Social History:  reports that she quit smoking about 4 years ago. Her smoking use included Cigarettes. She has a 44.00 pack-year smoking history. She has never used  smokeless tobacco. She reports that she does not drink alcohol or use drugs.   Allergies:  Allergies  Allergen Reactions  . Hydrochlorothiazide Nausea And Vomiting    Medications Prior to Admission  Medication Sig Dispense Refill  . amLODipine (NORVASC) 10 MG tablet Take 10 mg by mouth daily.    . cloNIDine (CATAPRES) 0.1 MG tablet Take 0.1 mg by mouth 2 (two) times daily.    . clopidogrel (PLAVIX) 75 MG tablet Take 75 mg by mouth daily.    . hydrALAZINE (APRESOLINE) 50 MG tablet Take 1 tablet (50 mg total) by mouth 4 (four) times daily. 270 tablet 3  . metformin (FORTAMET) 500 MG (OSM) 24 hr tablet Take 500 mg by mouth daily with breakfast.    . simvastatin (ZOCOR) 10 MG tablet Take 1 tablet (10 mg total) by mouth daily at 6 PM. 30 tablet 1  . venlafaxine XR (EFFEXOR-XR) 150 MG 24 hr capsule Take 150 mg by mouth daily with breakfast.         KGM:WNUUV from the symptoms mentioned above,there are no other symptoms referable to all systems reviewed.  Marland Kitchen amLODipine  10 mg Oral Daily  . aspirin  81 mg Oral Daily  . atorvastatin  40 mg Oral q1800  . insulin aspart  0-9 Units Subcutaneous TID WC  . metoprolol succinate  50 mg Oral Daily  .  nitroGLYCERIN  1 inch Topical Q8H  . sodium chloride flush  3 mL Intravenous Q12H    Physical Exam: Vital signs in last 24 hours: Temp:  [97.9 F (36.6 C)-98.4 F (36.9 C)] 98.4 F (36.9 C) (08/15 0540) Pulse Rate:  [80-109] 80 (08/15 0540) Resp:  [15-28] 18 (08/15 0540) BP: (158-216)/(78-102) 186/78 (08/15 0540) SpO2:  [91 %-98 %] 94 % (08/15 0540) Weight:  [180 lb (81.6 kg)] 180 lb (81.6 kg) (08/15 0210) Weight change:     Intake/Output from previous day: No intake/output data recorded. No intake/output data recorded.   Physical Exam: General- pt is awake,alert, oriented to time place and person Resp- No acute REsp distress,Rhonchi present CVS- S1S2 regular in rate and rhythm GIT- BS+, soft, NT, ND EXT- 1+ LE Edema, no Cyanosis,  chronic venous stasis changes CNS- CN 2-12 grossly intact. Moving all 4 extremities Psych- normal mood and affect    Lab Results: CBC  Recent Labs  10/13/16 0352  WBC 13.8*  HGB 6.3*  HCT 19.2*  PLT 299    BMET  Recent Labs  10/13/16 0352  NA 135  K 3.5  CL 108  CO2 16*  GLUCOSE 172*  BUN 39*  CREATININE 4.50*  CALCIUM 7.6*    Creat trend 2018   4.5 2016   1.4--1.5 2015   1.3--1.7 2014   1.1--2.0 ( AKi)    MICRO No results found for this or any previous visit (from the past 240 hour(s)).    Lab Results  Component Value Date   CALCIUM 7.6 (L) 10/13/2016   CAION 1.14 04/08/2013   Renal u/s done in  2014  Right Kidney Length: 11.8 cm.   Left Kidney Length: 8.7 cm. Non shadowing echogenic focus midpole cortex measuring 5 mm. Question stone or small angiomyolipoma appear.    Impression: 1)Renal  AKI secondary to ATN vs CKD progression               AKI on CKD               CKD stage 5 .               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 On Calcium Channel Blockers On Beta blockers    3)Anemia HGb not at goal (9--11) Pt recived PRBC  4)CKD Mineral-Bone Disorder PTH not avail . Secondary Hyperparathyroidism w/u pending. Phosphorus will check.   5)CHF- Diastolic dysfunction ? Echo pending Primary MD following  6)Electrolyes Normokalemic NOrmonatremic   7)Acid base Co2 not  at goal NON AG acidosis    Plan:  Agree with diuretics Will ask for auto immune work up Will ask for renal u/s Will ask for proteinuria work up Will ask for CKD-BMD work up. Will start po bicarb Will suggest anemia work up I had extensive discussion with pt, her husband and her sister about her kidney function and possible need for dialysis soon.    Chapin Arduini  S 10/13/2016, 8:31 AM

## 2016-10-13 NOTE — ED Provider Notes (Signed)
[facility] DEPT Provider Note   CSN: [id number] Arrival date & time: 10/13/16  0208  Time seen 02:55 AM   History   Chief Complaint Chief Complaint  Patient presents with  . Shortness of Breath    HPI Kelli [name] is a 67 y.o. female.  HPI  Patient states the evening of August 13 she woke up with shortness of breath and got better when she sat on the side of the bed.She states this morning at 1 AM she again felt short of breath and states her chest felt tight. Shestates she got sweaty and had some nausea and vomited once. She denies coughing. She states she chronically has swelling of her legs and the left is worse than the right because she's had a left hemiparesis. She also has swelling of her left hand. EMS was called and she was placed on oxygen. She states she feels better as far as her shortness of breath still has some chest tightness. She states EMS only gave her medication for nausea. Patient states she can't take aspirin because of couple years ago she had a low hemoglobin and when I look at the chart her hemoglobin dropped to 4.9. She states she had a ulcer and she was told not to take it anymore. She states she's never felt this way before.  PCP [name], [name], MD   Past Medical History:  Diagnosis Date  . Borderline diabetes   . Carotid artery occlusion    Occluded RICA, status post left CEA  August 2014 - Dr. Donnetta Hutching  . Cerebral infarction [facility]) Aug 2014   Bihemispheric watershed infarcts  . Cerebral infarction involving left cerebellar artery [facility]) Feb 2015  . CKD (chronic kidney disease) stage 3, GFR 30-59 ml/min   . Closed dislocation of left humerus 07/26/2013  . Essential hypertension, benign   . Fibromyalgia   . Mixed hyperlipidemia   . Multiple gastric ulcers   . Urinary incontinence     Patient Active Problem List   Diagnosis Date Noted  . Symptomatic anemia 10/13/2016  . AKI (acute kidney injury) ([place]) 10/13/2016  . PUD (peptic ulcer disease)  10/13/2016  . Pacemaker 10/13/2016  . Elevated troponin   . Hypovolemic shock ([place]) 04/26/2014  . Upper GI bleed   . Leukocytosis, unspecified 07/30/2013  . Thrombocytosis ([place]) 07/30/2013  . Subluxation of left shoulder joint 07/27/2013  . CKD (chronic kidney disease) stage 2, GFR 60-89 ml/min 07/27/2013  . Closed dislocation of left humerus 07/26/2013  . Pyelonephritis 07/25/2013  . Acute on chronic renal failure ([place]) 07/25/2013  . Hyperglycemia 07/25/2013  . Normocytic anemia 07/25/2013  . Hyponatremia 07/25/2013  . Spastic hemiplegia affecting nondominant side ([place]) 07/06/2013  . 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 UTI (urinary tract infection) 06/15/2013  . Edema 05/28/2013  . Knee pain 05/28/2013  . Carotid stenosis, bilateral 05/22/2013  . Fall at nursing home 05/20/2013  . Hip pain 05/20/2013  . Pain in joint, lower leg 05/19/2013  . Chest congestion 05/12/2013  . E. coli UTI 05/07/2013  . Left shoulder pain due to subluxation and/or adhesive capsulitis.  05/07/2013  . Acute CVA (cerebrovascular accident): R PCA infarct per MRI 04/13/13 04/13/2013  . CVA (cerebral infarction) 04/08/2013  . Acute ischemic stroke ([place]) 04/08/2013  . Diplopia 04/08/2013  . Left-sided weakness 04/08/2013  . Vertigo 04/08/2013  . Paresthesias in left hand 04/08/2013  . Pre-diabetes 04/08/2013  . Cerebellar stroke ([place]) 04/08/2013  . Dizziness and  giddiness 04/08/2013  . Stroke ([place]) 04/08/2013  . Depression 04/06/2013  . Peripheral arterial disease ([place]) 03/16/2013  . History of stroke 01/30/2013  . Chest pain 01/16/2013  . CKD (chronic kidney disease) stage 3, GFR 30-59 ml/min 01/16/2013  . Anxiety state, unspecified 12/08/2012  . Lumbago 12/08/2012  . Urinary frequency 11/14/2012  . Occlusion and stenosis of carotid artery with cerebral infarction 10/05/2012  . Mixed hyperlipidemia 10/05/2012  . Renal insufficiency 10/05/2012  .  HTN (hypertension) 10/04/2012  . Tobacco abuse 10/04/2012  . Obesity 10/04/2012    Past Surgical History:  Procedure Laterality Date  . COMBINED HYSTERECTOMY VAGINAL W/ MMK / A&P REPAIR  1981  . ENDARTERECTOMY Left 10/06/2012   Procedure: Carotid Endarterectomy with Finesse patch angioplasty;  Surgeon: [name], MD;  Location: [facility];  Service: Vascular;  Laterality: Left;  . ESOPHAGOGASTRODUODENOSCOPY N/A 04/26/2014   Procedure: ESOPHAGOGASTRODUODENOSCOPY (EGD);  Surgeon: Lear Ng, MD;  Location[facility] ENDOSCOPY;  Service: Endoscopy;  Laterality: N/A;  . LOOP RECORDER IMPLANT  04/16/13   MDT LinQ implanted for cryptogenic stroke  . [name] WITHOUT CARDIOVERSION N/A 04/16/2013   Procedure: TRANSESOPHAGEAL ECHOCARDIOGRAM ([name]);  Surgeon: Josue Hector, MD;  Location: [facility] ENDOSCOPY;  Service: Cardiovascular;  Laterality: N/A;  . URETHRAL DILATION  1980's  . VAGINAL HYSTERECTOMY  1981   "partial" (10/04/2012)    OB History    No data available       Home Medications    Prior to Admission medications   Medication Sig Start Date End Date Taking? Authorizing Provider  amLODipine (NORVASC) 10 MG tablet Take 10 mg by mouth daily.    [provider]  cloNIDine (CATAPRES) 0.1 MG tablet Take 0.1 mg by mouth 2 (two) times daily.    [provider]  clopidogrel (PLAVIX) 75 MG tablet Take 75 mg by mouth daily.    [provider]  hydrALAZINE (APRESOLINE) 50 MG tablet Take 1 tablet (50 mg total) by mouth 4 (four) times daily. 05/07/13   Love, Ivan Anchors, PA-C  metformin (FORTAMET) 500 MG (OSM) 24 hr tablet Take 500 mg by mouth daily with breakfast.    [provider]  simvastatin (ZOCOR) 10 MG tablet Take 1 tablet (10 mg total) by mouth daily at 6 PM. 10/19/12   Angiulli, Lavon Paganini, PA-C  venlafaxine XR (EFFEXOR-XR) 150 MG 24 hr capsule Take 150 mg by mouth daily with breakfast.    [provider]    Family History Family History  Problem Relation Age  of Onset  . Diabetes Mother   . Diabetes Father   . Hypertension Sister   . Diabetes Brother   . Seizures Son   . Kidney disease Maternal Grandmother   . Hypertension Maternal Grandmother   . Heart disease Maternal Grandfather   . Diabetes Paternal Grandmother   . Heart disease Paternal Grandfather     Social History Social History  Substance Use Topics  . Smoking status: Former Smoker    Packs/day: 1.00    Years: 44.00    Types: Cigarettes    Quit date: 10/03/2012  . Smokeless tobacco: Never Used  . Alcohol use No  lives at home No home oxygen   Allergies   Hydrochlorothiazide   Review of Systems Review of Systems  All other systems reviewed and are negative.    Physical Exam Updated Vital Signs BP (!) 186/78 (BP Location: Right Arm)   Pulse 80   Temp 98.4 F (36.9 C) (Oral)  Resp 18   Ht 5\' 5"  (1.651 m)   Wt 81.6 kg (180 lb)   SpO2 94%   BMI 29.95 kg/m   Vital signs normal except for hypertension   Physical Exam  Constitutional: She is oriented to person, place, and time. She appears well-developed and well-nourished.  Non-toxic appearance. She does not appear ill. No distress.  Lying quietly on a stretcher in no apparent distress.  HENT:  Head: Normocephalic and atraumatic.  Right Ear: External ear normal.  Left Ear: External ear normal.  Nose: Nose normal. No mucosal edema or rhinorrhea.  Mouth/Throat: Oropharynx is clear and moist and mucous membranes are normal. No dental abscesses or uvula swelling.  Eyes: Pupils are equal, round, and reactive to light. Conjunctivae and EOM are normal.  Neck: Normal range of motion and full passive range of motion without pain. Neck supple.  Cardiovascular: Normal rate, regular rhythm and normal heart sounds.  Exam reveals no gallop and no friction rub.   No murmur heard. Pulmonary/Chest: Effort normal. No accessory muscle usage. No tachypnea. No respiratory distress. She has decreased breath sounds. She has no  wheezes. She has no rhonchi. She has no rales. She exhibits no tenderness and no crepitus.  Abdominal: Soft. Normal appearance and bowel sounds are normal. She exhibits no distension. There is no tenderness. There is no rebound and no guarding.  Musculoskeletal: Normal range of motion. She exhibits edema. She exhibits no tenderness.  [name] noted to have a lot of edema of her left hand. She has edema of both lower legs, left worse than the right, mildly pitting.  Neurological: She is alert and oriented to person, place, and time. She has normal strength. No cranial nerve deficit.  Left hemiparesis  Skin: Skin is warm, dry and intact. No rash noted. No erythema. There is pallor.  Psychiatric: She has a normal mood and affect. Her speech is normal and behavior is normal. Her mood appears not anxious.  Nursing note and vitals reviewed.    ED Treatments / Results  Labs (all labs ordered are listed, but only abnormal results are displayed) Results for orders placed or performed during the hospital encounter of 10/13/16  Comprehensive metabolic panel  Result Value Ref Range   Sodium 135 135 - 145 mmol/L   Potassium 3.5 3.5 - 5.1 mmol/L   Chloride 108 101 - 111 mmol/L   CO2 16 (L) 22 - 32 mmol/L   Glucose, Bld 172 (H) 65 - 99 mg/dL   BUN 39 (H) 6 - 20 mg/dL   Creatinine, Ser 4.50 (H) 0.44 - 1.00 mg/dL   Calcium 7.6 (L) 8.9 - 10.3 mg/dL   Total Protein 6.9 6.5 - 8.1 g/dL   Albumin 3.3 (L) 3.5 - 5.0 g/dL   AST 13 (L) 15 - 41 U/L   ALT 11 (L) 14 - 54 U/L   Alkaline Phosphatase 68 38 - 126 U/L   Total Bilirubin 0.4 0.3 - 1.2 mg/dL   GFR calc non Af Amer 9 (L) >60 mL/min   GFR calc Af Amer 11 (L) >60 mL/min   Anion gap 11 5 - 15  Brain natriuretic peptide  Result Value Ref Range   B Natriuretic Peptide 383.0 (H) 0.0 - 100.0 pg/mL  D-dimer, quantitative  Result Value Ref Range   D-Dimer, Quant 1.06 (H) 0.00 - 0.50 ug/mL-FEU  CBC with Differential  Result Value Ref Range   WBC 13.8  (H) 4.0 - 10.5 K/uL   RBC 2.25 (L) 3.87 -  5.11 MIL/uL   Hemoglobin 6.3 (LL) 12.0 - 15.0 g/dL   HCT 19.2 (L) 36.0 - 46.0 %   MCV 85.3 78.0 - 100.0 fL   MCH 28.0 26.0 - 34.0 pg   MCHC 32.8 30.0 - 36.0 g/dL   RDW 14.9 11.5 - 15.5 %   Platelets 299 150 - 400 K/uL   Neutrophils Relative % 83 %   Lymphocytes Relative 9 %   Monocytes Relative 6 %   Eosinophils Relative 2 %   Basophils Relative 0 %   Neutro Abs 11.5 (H) 1.7 - 7.7 K/uL   Lymphs Abs 1.2 0.7 - 4.0 K/uL   Monocytes Absolute 0.8 0.1 - 1.0 K/uL   Eosinophils Absolute 0.3 0.0 - 0.7 K/uL   Basophils Absolute 0.0 0.0 - 0.1 K/uL   RBC Morphology POLYCHROMASIA PRESENT   Troponin I  Result Value Ref Range   Troponin I 0.06 (HH) <0.03 ng/mL  POC occult blood, ED RN will collect  Result Value Ref Range   Fecal Occult Bld NEGATIVE NEGATIVE  Type and screen [facility]  Result Value Ref Range   ABO/RH(D) A NEG    Antibody Screen NEG    Sample Expiration 10/16/2016    Unit Number [id number]    Blood Component Type RED CELLS,LR    Unit division 00    Status of Unit ALLOCATED    Transfusion Status OK TO TRANSFUSE    Crossmatch Result Compatible    Unit Number [id number]    Blood Component Type RED CELLS,LR    Unit division 00    Status of Unit ISSUED    Transfusion Status OK TO TRANSFUSE    Crossmatch Result Compatible   Prepare RBC  Result Value Ref Range   Order Confirmation ORDER PROCESSED BY BLOOD BANK   BPAM RBC  Result Value Ref Range   Blood Product Unit Number [id number]    Unit Type and Rh 0600    Blood Product Expiration Date 740814481856    ISSUE DATE / TIME [id number]    Blood Product Unit Number [id number]    PRODUCT CODE [id number]    Unit Type and Rh 0600    Blood Product Expiration Date 709628366294    Laboratory interpretation all normal except anemia, her last hemoglobin was 9.6  2 years ago,  Worsening of her chronic renal insufficiency with creatinine 1.5 in 2016, positive  troponin,hyperglycemia minimally elevated BNP which is probably not significant,  positive d-dimer    EKG  EKG Interpretation  Date/Time:  Wednesday October 13 2016 02:23:03 EDT Ventricular Rate:  109 PR Interval:    QRS Duration: 90 QT Interval:  358 QTC Calculation: 483 R Axis:   12 Text Interpretation:  Sinus tachycardia Atrial premature complex Anterior infarct, old Borderline repolarization abnormality No significant change since last tracing 25 Apr 2014 Confirmed by Rolland Porter [id number]) on 10/13/2016 4:09:48 AM       Radiology Dg Chest Port 1 View  Result Date: 10/13/2016 CLINICAL DATA:  Shortness of breath and chest tightness EXAM: PORTABLE CHEST 1 VIEW COMPARISON:  04/26/2014 FINDINGS: Moderate cardiomegaly with globular cardiac configuration, increased compared to prior radiograph. Central vascular congestion and mild interstitial perihilar edema. No pleural effusion. Aortic atherosclerosis. No pneumothorax. Electronic device over the left chest. IMPRESSION: Moderate cardiomegaly with globular cardiac configuration, findings could be secondary to multi chamber enlargement or pericardial effusion. There is central vascular congestion and mild perihilar edema. Electronically Signed   By: Donavan Foil M.D.   On:  10/13/2016 03:25    Procedures Procedures (including critical care time)  CRITICAL CARE Performed by: Isaias Dowson L Sharron Petruska Total critical care time: 33 minutes Critical care time was exclusive of separately billable procedures and treating other patients. Critical care was necessary to treat or prevent imminent or life-threatening deterioration. Critical care was time spent personally by me on the following activities: development of treatment plan with patient and/or surrogate as well as nursing, discussions with consultants, evaluation of patient's response to treatment, examination of patient, obtaining history from patient or surrogate, ordering and performing treatments and  interventions, ordering and review of laboratory studies, ordering and review of radiographic studies, pulse oximetry and re-evaluation of patient's condition.   Medications Ordered in ED Medications  metoprolol tartrate (LOPRESSOR) injection 5 mg (5 mg Intravenous Given 10/13/16 0539)  0.9 %  sodium chloride infusion (not administered)  nitroGLYCERIN (NITROGLYN) 2 % ointment 1 inch (1 inch Topical Given 10/13/16 0329)  acetaminophen (TYLENOL) tablet 650 mg (650 mg Oral Given 10/13/16 0329)     Initial Impression / Assessment and Plan / ED Course  I have reviewed the triage vital signs and the nursing notes.  Pertinent labs & imaging results that were available during my care of the patient were reviewed by me and considered in my medical decision making (see chart for details).   nitroglycerin paste was placed on her chest for her chest pain and her hypertension.patient continued to be hypertensive, she was given Lopressor IV.She was given acetaminophen for anticipated nitroglycerin headache.  4:10 AM lab reports her hemoglobin is 6.3. Patient was typed and crossed for 2 units of blood, 1 unit to be transfused.Hemoccult testing was ordered.  4:20 AM I discussed with the patient her hemoglobin result. We discussed she would need to be admitted however the rest of her blood work has not resulted yet. She denies melena or rectal bleeding. She is not on any aspirin products.  Hospitalist was counseled after the rest her blood work resulted. I did not pursue the elevated d-dimer because patient is anemic and has the hx of GI bleed. It is not clear at this point because our prior lab work is 66 years old however she does have worsening of her chronic renal insufficiency/failure or she may be having some intermittent GI bleeding.  05:25 AM  Dr. Shanon Brow, hospitalist will admit.  Final Clinical Impressions(s) / ED Diagnoses   Final diagnoses:  Chronic renal failure, unspecified CKD stage  Anemia,  unspecified type  Chest pain, unspecified type  Elevated troponin    Plan admission  Rolland Porter, MD, Barbette Or, MD 10/13/16 [id number]

## 2016-10-13 NOTE — Consult Note (Addendum)
Cardiology Consultation:   Patient ID: [name]; [id number]; 14-Jun-1950   Admit date: 10/13/2016 Date of Consult: 10/13/2016  Primary Care Provider: Manon Hilding, MD Primary Cardiologist: Dr. Lovena Le Primary Electrophysiologist:  Dr. Lovena Le   Patient Profile:   Kelli [name] is a 66 y.o. female with a hx of Cryptogenic stroke who is being seen today for the evaluation of chest pain and heart failure at the request of Dr. Carles [name]  History of Present Illness:   Ms. Kelli [name] has a history of multiple strokes and had placement of a loop recorder that has not shown any evidence of atrial fibrillation. She has been maintained on Plavix. She is bedridden, has hypertension and carotid stenosis. 2-D echo in 2015 showed normal systolic function EF 61-44%.  2 days ago the patient began complaining of worsening shortness of breath and had to sit up in bed. Last night she developed chest pressure and was brought to the emergency room by her husband where she was found to have a hemoglobin of 6.3, creatinine 4.5, CHF. EKG shows sinus tachycardia at 109 bpm, BNP 383 troponin 0.062. She denies any prior chest pain or heart failure. She has chronic lower left leg edema since her stroke.  Past Medical History:  Diagnosis Date  . Borderline diabetes   . Carotid artery occlusion    Occluded RICA, status post left CEA  August 2014 - Dr. Donnetta [name]  . Cerebral infarction [facility]) Aug 2014   Bihemispheric watershed infarcts  . Cerebral infarction involving left cerebellar artery [facility]) Feb 2015  . CKD (chronic kidney disease) stage 3, GFR 30-59 ml/min   . Closed dislocation of left humerus 07/26/2013  . Essential hypertension, benign   . Fibromyalgia   . Mixed hyperlipidemia   . Multiple gastric ulcers   . Urinary incontinence     Past Surgical History:  Procedure Laterality Date  . COMBINED HYSTERECTOMY VAGINAL W/ MMK / A&P REPAIR  1981  . ENDARTERECTOMY Left 10/06/2012   Procedure: Carotid Endarterectomy  with Finesse patch angioplasty;  Surgeon: Kelli Posner, MD;  Location: [place];  Service: Vascular;  Laterality: Left;  . ESOPHAGOGASTRODUODENOSCOPY N/A 04/26/2014   Procedure: ESOPHAGOGASTRODUODENOSCOPY (EGD);  Surgeon: Kelli Ng, MD;  Location: [facility] ENDOSCOPY;  Service: Endoscopy;  Laterality: N/A;  . LOOP RECORDER IMPLANT  04/16/13   MDT LinQ implanted for cryptogenic stroke  . [name] WITHOUT CARDIOVERSION N/A 04/16/2013   Procedure: TRANSESOPHAGEAL ECHOCARDIOGRAM ([name]);  Surgeon: Kelli Hector, MD;  Location: [facility] ENDOSCOPY;  Service: Cardiovascular;  Laterality: N/A;  . URETHRAL DILATION  1980's  . VAGINAL HYSTERECTOMY  1981   "partial" (10/04/2012)     Inpatient Medications: Scheduled Meds: . amLODipine  10 mg Oral Daily  . aspirin  81 mg Oral Daily  . atorvastatin  40 mg Oral q1800  . furosemide  40 mg Intravenous BID  . hydrALAZINE  25 mg Oral BID  . insulin aspart  0-9 Units Subcutaneous TID WC  . metoprolol succinate  50 mg Oral Daily  . nitroGLYCERIN  1 inch Topical Q8H  . sodium bicarbonate  650 mg Oral TID  . sodium chloride flush  3 mL Intravenous Q12H   Continuous Infusions: . sodium chloride     PRN Meds: sodium chloride, metoprolol tartrate, ondansetron (ZOFRAN) IV, sodium chloride flush  Allergies:    Allergies  Allergen Reactions  . Hydrochlorothiazide Nausea And Vomiting    Social History:   Social History   Social History  . Marital status:  Married    Spouse name: N/A  . Number of children: N/A  . Years of education: N/A   Occupational History  . Not on file.   Social History Main Topics  . Smoking status: Former Smoker    Packs/day: 1.00    Years: 44.00    Types: Cigarettes    Quit date: 10/03/2012  . Smokeless tobacco: Never Used  . Alcohol use No  . Drug use: No  . Sexual activity: Yes    Birth control/ protection: Surgical   Other Topics Concern  . Not on file   Social History Narrative  . No narrative on file    Family History:      Family History  Problem Relation Age of Onset  . Diabetes Mother   . Diabetes Father   . Hypertension Sister   . Diabetes Brother   . Seizures Son   . Kidney disease Maternal Grandmother   . Hypertension Maternal Grandmother   . Heart disease Maternal Grandfather   . Diabetes Paternal Grandmother   . Heart disease Paternal Grandfather      ROS:  Please see the history of present illness.  Review of Systems  Constitution: Negative.  HENT: Negative.   Eyes: Negative.   Cardiovascular: Positive for chest pain and leg swelling.  Respiratory: Positive for shortness of breath and sleep disturbances due to breathing.   Hematologic/Lymphatic: Negative.   Musculoskeletal: Negative.  Negative for joint pain.  Gastrointestinal: Positive for nausea.  Genitourinary: Negative.   Neurological: Positive for difficulty with concentration, disturbances in coordination and focal weakness.     All other ROS reviewed and negative.     Physical Exam/Data:   Vitals:   10/13/16 0513 10/13/16 0540 10/13/16 0900 10/13/16 0927  BP: (!) 192/83 (!) 186/78 (!) 200/82 (!) 196/79  Pulse: 86 80 86 87  Resp: 20 18 (!) 26 (!) 28  Temp:  98.4 F (36.9 C) 98.2 F (36.8 C) 98 F (36.7 C)  TempSrc:  Oral Oral Oral  SpO2: 95% 94% (!) 86% 94%  Weight:      Height:        Intake/Output Summary (Last 24 hours) at 10/13/16 1040 Last data filed at 10/13/16 0912  Gross per 24 hour  Intake              585 ml  Output                0 ml  Net              585 ml   Filed Weights   10/13/16 0210  Weight: 180 lb (81.6 kg)   Body mass index is 29.95 kg/m.  General:  Well nourished, well developed, in no acute distress  HEENT: normal Lymph: no adenopathy Neck: Increased JVD Endocrine:  No thryomegaly Vascular: No carotid bruits; FA pulses 2+ bilaterally without bruits  Cardiac:  normal S1, S2; RRR; no murmur   Lungs:  Diffuse rales throughout  Abd: soft, nontender, no hepatomegaly  Ext: plus 2  bilateral lower extremity edema Musculoskeletal:  No deformities normal and equal Skin: warm and dry  Neuro:  Previous stroke with left hemiparesis Psych:  Normal affect   EKG:  The EKG was personally reviewed and demonstrates:  Sinus tachycardia at 109 bpm with poor R wave progression anteriorly, no acute change Telemetry:  Telemetry is currently down and not working, unable to review strips  Relevant CV Studies: [name] 2015Study Conclusions  - Left ventricle: Systolic  function was normal. The   estimated ejection fraction was in the range of 55% to   60%. Wall motion was normal; there were no regional wall   motion abnormalities. - Aorta: Grade 4 bulky mobile aortic debris extends up to   the arch and could possibly be a source of embolus   Consider CTA to further assess and r/o penetrating ulcers   of throacic aorta - Left atrium: No evidence of thrombus in the atrial cavity   or appendage. No evidence of thrombus in the atrial cavity   or appendage. - Right atrium: No evidence of thrombus in the atrial cavity   or appendage. - Atrial septum: There was increased thickness of the   septum, consistent with lipomatous hypertrophy. Transesophageal echocardiography.  2D and color Doppler. Height:  Height: 157.5cm. Height: 62in.  Weight:  Weight: 98.6kg. Weight: 217lb.  Body mass index:  BMI: 39.8kg/m^2. Body surface area:    BSA: 2.53m^2.  Blood pressure: 137/74.  Patient status:  Inpatient.  Location:  Endoscopy.     Laboratory Data:  Chemistry Recent Labs Lab 10/13/16 0352  NA 135  K 3.5  CL 108  CO2 16*  GLUCOSE 172*  BUN 39*  CREATININE 4.50*  CALCIUM 7.6*  GFRNONAA 9*  GFRAA 11*  ANIONGAP 11     Recent Labs Lab 10/13/16 0352  PROT 6.9  ALBUMIN 3.3*  AST 13*  ALT 11*  ALKPHOS 68  BILITOT 0.4   Hematology Recent Labs Lab 10/13/16 0352 10/13/16 0427 10/13/16 0551  WBC 13.8*  --  16.5*  RBC 2.25* 2.26* 2.42*  HGB 6.3*  --  6.9*  HCT 19.2*  --  20.8*   MCV 85.3  --  86.0  MCH 28.0  --  28.5  MCHC 32.8  --  33.2  RDW 14.9  --  15.0  PLT 299  --  376   Cardiac Enzymes Recent Labs Lab 10/13/16 0352 10/13/16 0659  TROPONINI 0.06* 0.06*   No results for input(s): TROPIPOC in the last 168 hours.  BNP Recent Labs Lab 10/13/16 0352  BNP 383.0*    DDimer  Recent Labs Lab 10/13/16 0352  DDIMER 1.06*    Radiology/Studies:  Dg Chest Port 1 View  Result Date: 10/13/2016 CLINICAL DATA:  Shortness of breath and chest tightness EXAM: PORTABLE CHEST 1 VIEW COMPARISON:  04/26/2014 FINDINGS: Moderate cardiomegaly with globular cardiac configuration, increased compared to prior radiograph. Central vascular congestion and mild interstitial perihilar edema. No pleural effusion. Aortic atherosclerosis. No pneumothorax. Electronic device over the left chest. IMPRESSION: Moderate cardiomegaly with globular cardiac configuration, findings could be secondary to multi chamber enlargement or pericardial effusion. There is central vascular congestion and mild perihilar edema. Electronically Signed   By: Donavan Foil M.D.   On: 10/13/2016 03:25    Assessment and Plan:   1. Anemia with hemoglobin of 6.3 resumed GI bleed on Plavix 2. Chest pain in the setting of profound anemia has resolved, troponins elevated but flat at 0.06 most likely secondary to demand ischemia. No history of CAD. 3. Acute CHF in the setting of profound anemia and chest pain question diastolic. History of normal LVEF in 2015. Repeat 2-D echo pending. Needs IV diuresis but need to be very careful with acute renal failure. Received 80 mg IV in ED now 40 mg IV twice a day. Receiving blood transfusion and so far I knows are +585 since 7 AM. Discuss with Dr. Johnsie Cancel and will place on 120 mg IV every  8 hours 4. Acute on chronic renal failure with creatinine of 4.5 being followed by renal 5. History of 3 cryptogenic strokes with left hemiparesis loop recorder has never documented atrial  fibrillation since 2015. Followed by Dr. Lovena Le. 6. Hypertension elevated and uncontrolled. Hydralazine 25 mg twice a day, metoprolol 50 mg daily and amlodipine 10 mg daily. May need further adjustments. 7. HLD on statin 8.Diabetes mellitus metformin stopped secondary to renal failure   Signed, Ermalinda Barrios, PA-C  10/13/2016 10:40 AM   Patient examined chart reviewed. Biggest issue is volume overload and anemia from A/CRF.  Little insight into likely need for dialysis. She will need higher dose of lasix to diurese Atypical chest pain likely from uremia. Will check echo to r/o effusion and assess EF. Increase lasix to 120 iv q 8 hours Increase hydralazine to 50 q 8 for better BP contorl Exam with chronically ill female, SEM no rub LE edema and left hemiparesis from previous stroke Getting transfused for anemia and will need Aranasp to stimulate BM and help with anemia given renal failure   Jenkins Rouge

## 2016-10-13 NOTE — H&P (Signed)
History and Physical    Kelli [name] AJO:[id number] DOB: 09-10-1950 DOA: 10/13/2016  PCP: Manon Hilding, MD  Patient coming from:  home  Chief Complaint:  Sob and sscp  HPI: Kelli [name] is a 66 y.o. female with medical history significant of CKD unknown baseline, HTN chronically poorly controlled, DM, obesity, prev CVA with left hemiparesis, PVD comes in with sob several times over the last couple of days with an episode of sscp that awoke her from sleep with associated diaphoresis.  She denies any fevers.  Pt reports she has been told by her PCP that her "kidneys are bad"  She has not seen a nephrologist and no one has mentioned dialysis to her before.  She says her bp normally runs in the 170-180 range at home.  She reports she does take her bp med daily.  She denies any wt gain but has chronic ble swelling and edema.  She denies any vomiting blood, melena or brbpr.  She denies any epigastric pain.  She did have a gib about 2 years ago which was due to ulcers and she needed a blood transfusion at that time.  Pt found to have hgb of 6, cr up to 4.5 and referred for admission for symptomatic anemia, chest pain and worsening renal failure.  She is having no pain at this time.  Her sbp was 210 on arrival now around 190.   Review of Systems: As per HPI otherwise 10 point review of systems negative.   Past Medical History:  Diagnosis Date  . Borderline diabetes   . Carotid artery occlusion    Occluded RICA, status post left CEA  August 2014 - Dr. Donnetta Hutching  . Cerebral infarction [facility]) Aug 2014   Bihemispheric watershed infarcts  . Cerebral infarction involving left cerebellar artery [facility]) Feb 2015  . CKD (chronic kidney disease) stage 3, GFR 30-59 ml/min   . Closed dislocation of left humerus 07/26/2013  . Essential hypertension, benign   . Fibromyalgia   . Mixed hyperlipidemia   . Multiple gastric ulcers   . Urinary incontinence     Past Surgical History:  Procedure Laterality Date  .  COMBINED HYSTERECTOMY VAGINAL W/ MMK / A&P REPAIR  1981  . ENDARTERECTOMY Left 10/06/2012   Procedure: Carotid Endarterectomy with Finesse patch angioplasty;  Surgeon: [name], MD;  Location: [place];  Service: Vascular;  Laterality: Left;  . ESOPHAGOGASTRODUODENOSCOPY N/A 04/26/2014   Procedure: ESOPHAGOGASTRODUODENOSCOPY (EGD);  Surgeon: Lear Ng, MD;  Location: [facility] ENDOSCOPY;  Service: Endoscopy;  Laterality: N/A;  . LOOP RECORDER IMPLANT  04/16/13   MDT LinQ implanted for cryptogenic stroke  . [name] WITHOUT CARDIOVERSION N/A 04/16/2013   Procedure: TRANSESOPHAGEAL ECHOCARDIOGRAM ([name]);  Surgeon: Josue Hector, MD;  Location: [facility] ENDOSCOPY;  Service: Cardiovascular;  Laterality: N/A;  . URETHRAL DILATION  1980's  . VAGINAL HYSTERECTOMY  1981   "partial" (10/04/2012)     reports that she quit smoking about 4 years ago. Her smoking use included Cigarettes. She has a 44.00 pack-year smoking history. She has never used smokeless tobacco. She reports that she does not drink alcohol or use drugs.  Allergies  Allergen Reactions  . Hydrochlorothiazide Nausea And Vomiting    Family History  Problem Relation Age of Onset  . Diabetes Mother   . Diabetes Father   . Hypertension Sister   . Diabetes Brother   . Seizures Son   . Kidney disease Maternal Grandmother   . Hypertension Maternal Grandmother   .  Heart disease Maternal Grandfather   . Diabetes Paternal Grandmother   . Heart disease Paternal Grandfather     Prior to Admission medications   Medication Sig Start Date End Date Taking? Authorizing Provider  amLODipine (NORVASC) 10 MG tablet Take 10 mg by mouth daily.    [provider]  cloNIDine (CATAPRES) 0.1 MG tablet Take 0.1 mg by mouth 2 (two) times daily.    [provider]  clopidogrel (PLAVIX) 75 MG tablet Take 75 mg by mouth daily.    [provider]  hydrALAZINE (APRESOLINE) 50 MG tablet Take 1 tablet (50 mg total) by mouth 4 (four) times  daily. 05/07/13   Love, Ivan Anchors, PA-C  metformin (FORTAMET) 500 MG (OSM) 24 hr tablet Take 500 mg by mouth daily with breakfast.    [provider]  simvastatin (ZOCOR) 10 MG tablet Take 1 tablet (10 mg total) by mouth daily at 6 PM. 10/19/12   Angiulli, Lavon Paganini, PA-C  venlafaxine XR (EFFEXOR-XR) 150 MG 24 hr capsule Take 150 mg by mouth daily with breakfast.    [provider]    Physical Exam: Vitals:   10/13/16 0430 10/13/16 0500 10/13/16 0513 10/13/16 0540  BP: (!) 202/87 (!) 188/87 (!) 192/83 (!) 186/78  Pulse:  85 86 80  Resp: 18 15 20 18   Temp:    98.4 F (36.9 C)  TempSrc:    Oral  SpO2:  95% 95% 94%  Weight:      Height:          Constitutional: NAD, calm, comfortable Vitals:   10/13/16 0430 10/13/16 0500 10/13/16 0513 10/13/16 0540  BP: (!) 202/87 (!) 188/87 (!) 192/83 (!) 186/78  Pulse:  85 86 80  Resp: 18 15 20 18   Temp:    98.4 F (36.9 C)  TempSrc:    Oral  SpO2:  95% 95% 94%  Weight:      Height:       Eyes: PERRL, lids and conjunctivae normal ENMT: Mucous membranes are moist. Posterior pharynx clear of any exudate or lesions.Normal dentition.  Neck: normal, supple, no masses, no thyromegaly Respiratory: clear to auscultation bilaterally, no wheezing, no crackles. Normal respiratory effort. No accessory muscle use.  Cardiovascular: Regular rate and rhythm, no murmurs / rubs / gallops. 2+ extremity edema. 2+ pedal pulses. No carotid bruits.  Abdomen: no tenderness, no masses palpated. No hepatosplenomegaly. Bowel sounds positive.  Musculoskeletal: no clubbing / cyanosis. No joint deformity upper and lower extremities. Good ROM, no contractures. Normal muscle tone.  Skin: no rashes, lesions, ulcers. No induration Neurologic: CN 2-12 grossly intact. Sensation intact, DTR normal. Left sided weakness Psychiatric: Normal judgment and insight. Alert and oriented x 3. Normal mood.    Labs on Admission: I have personally reviewed following labs  and imaging studies  CBC:  Recent Labs Lab 10/13/16 0352  WBC 13.8*  NEUTROABS 11.5*  HGB 6.3*  HCT 19.2*  MCV 85.3  PLT 671   Basic Metabolic Panel:  Recent Labs Lab 10/13/16 0352  NA 135  K 3.5  CL 108  CO2 16*  GLUCOSE 172*  BUN 39*  CREATININE 4.50*  CALCIUM 7.6*   GFR: Estimated Creatinine Clearance: 13 mL/min (A) (by C-G formula based on SCr of 4.5 mg/dL (H)). Liver Function Tests:  Recent Labs Lab 10/13/16 0352  AST 13*  ALT 11*  ALKPHOS 68  BILITOT 0.4  PROT 6.9  ALBUMIN 3.3*   Cardiac Enzymes:  Recent Labs Lab 10/13/16 0352  TROPONINI 0.06*   Urine analysis:    Component Value Date/Time   COLORURINE YELLOW 04/25/2014 [place] 04/25/2014 2315   LABSPEC 1.020 04/25/2014 2315   PHURINE 5.5 04/25/2014 2315   GLUCOSEU NEGATIVE 04/25/2014 2315   HGBUR NEGATIVE 04/25/2014 2315   BILIRUBINUR NEGATIVE 04/25/2014 2315   KETONESUR NEGATIVE 04/25/2014 2315   PROTEINUR 100 (A) 04/25/2014 2315   UROBILINOGEN 0.2 04/25/2014 2315   NITRITE NEGATIVE 04/25/2014 2315   LEUKOCYTESUR NEGATIVE 04/25/2014 2315    Radiological Exams on Admission: Dg Chest Port 1 View  Result Date: 10/13/2016 CLINICAL DATA:  Shortness of breath and chest tightness EXAM: PORTABLE CHEST 1 VIEW COMPARISON:  04/26/2014 FINDINGS: Moderate cardiomegaly with globular cardiac configuration, increased compared to prior radiograph. Central vascular congestion and mild interstitial perihilar edema. No pleural effusion. Aortic atherosclerosis. No pneumothorax. Electronic device over the left chest. IMPRESSION: Moderate cardiomegaly with globular cardiac configuration, findings could be secondary to multi chamber enlargement or pericardial effusion. There is central vascular congestion and mild perihilar edema. Electronically Signed   By: Donavan Foil M.D.   On: 10/13/2016 03:25    EKG: Independently reviewed. Sinus tach around 110 no acute issues cxr reviewed edema  mild Old chart reviewed Case discussed with dr Daleen Bo knapp  Assessment/Plan 66 yo female with chest pain, symptomatic anemia and worsening renal failure  Principal Problem:   Symptomatic anemia- normocytic and likely due to chronic renal disease.  Ck tsh, ck anemia panel.  Heme neg in the ED.  No labs here for the last 2 years.  Transfuse one unit for now.  Has another unit ready if needed, will likely need lasix between units.  Active Problems:   Chest pain- likely has underlying CAD,  And likely caused by anemia.  Place on aspirin as no overt bleeding.  Place on toprol, lipitor.  Serial trop.  Obtain cardiac echo.  Very poor cath candidate due to renal disease.  Defer further cardiac work up until other acute issues addressed   AKI (acute kidney injury) ([place])- unknown what her baseline is, last here is 2016 and was 1.7, per pt it sounds like this has been worsening for a long time.  Obtain nephro consult.  Likely more chronic in nature and due to longstanding uncontrolled htn.  Ck ua.   HTN (hypertension)- added bblocker.  Cont norvasc.  Has ntg paste on til rules out   Obesity- noted   CKD (chronic kidney disease) stage 3, GFR 30-59 ml/min- noted, (this is dx from 2014)   History of stroke- noted   Peripheral arterial disease ([place])- noted   Spastic hemiplegia affecting nondominant side ([place])- noted   Normocytic anemia-as above   PUD (peptic ulcer disease)- no evidence of bleeding at this time.  PPI.   Pacemaker- noted     DVT prophylaxis:  Scds Code Status:  full Family Communication:  husband Disposition Plan:  Per day team Consults called:  nephrology Admission status:  Admission  Med rec not completed.  Husband will bring in her pill bottles later today.   Char Feltman A MD [facility]  If 7PM-7AM, please contact night-coverage [email or web address] Password [facility]  10/13/2016, 5:53 AM

## 2016-10-13 NOTE — ED Notes (Signed)
Date and time results received: 10/13/16 0410  Test: Hgb Critical Value: 6.3  Name of Provider Notified: Dr. Acquanetta Belling  Orders Received? Or Actions Taken?: N/A-she will add orders

## 2016-10-14 ENCOUNTER — Inpatient Hospital Stay (HOSPITAL_COMMUNITY): Payer: 59

## 2016-10-14 DIAGNOSIS — J9601 Acute respiratory failure with hypoxia: Secondary | ICD-10-CM

## 2016-10-14 DIAGNOSIS — N183 Chronic kidney disease, stage 3 (moderate): Secondary | ICD-10-CM

## 2016-10-14 DIAGNOSIS — Z8673 Personal history of transient ischemic attack (TIA), and cerebral infarction without residual deficits: Secondary | ICD-10-CM

## 2016-10-14 DIAGNOSIS — N179 Acute kidney failure, unspecified: Secondary | ICD-10-CM

## 2016-10-14 DIAGNOSIS — I739 Peripheral vascular disease, unspecified: Secondary | ICD-10-CM

## 2016-10-14 DIAGNOSIS — I340 Nonrheumatic mitral (valve) insufficiency: Secondary | ICD-10-CM

## 2016-10-14 DIAGNOSIS — I248 Other forms of acute ischemic heart disease: Secondary | ICD-10-CM

## 2016-10-14 LAB — GLUCOSE, CAPILLARY
GLUCOSE-CAPILLARY: 142 mg/dL — AB (ref 65–99)
Glucose-Capillary: 158 mg/dL — ABNORMAL HIGH (ref 65–99)
Glucose-Capillary: 143 mg/dL — ABNORMAL HIGH (ref 65–99)
Glucose-Capillary: 178 mg/dL — ABNORMAL HIGH (ref 65–99)

## 2016-10-14 LAB — COMPLEMENT, TOTAL: Compl, Total (CH50): >60 U/mL (ref >41)

## 2016-10-14 LAB — IMMUNOFIXATION ELECTROPHORESIS
IgA: 270 mg/dL (ref 87–352)
IgG (Immunoglobin G), Serum: 806 mg/dL (ref 700–1600)
IgM (Immunoglobulin M), Srm: 86 mg/dL (ref 26–217)
Total Protein ELP: 6.6 g/dL (ref 6.0–8.5)

## 2016-10-14 LAB — PROTEIN ELECTROPHORESIS, SERUM
A/G Ratio: 1.2 (ref 0.7–1.7)
Albumin ELP: 3.5 g/dL (ref 2.9–4.4)
Alpha-1-Globulin: 0.3 g/dL (ref 0.0–0.4)
Alpha-2-Globulin: 1 g/dL (ref 0.4–1.0)
Beta Globulin: 1.1 g/dL (ref 0.7–1.3)
Gamma Globulin: 0.7 g/dL (ref 0.4–1.8)
Globulin, Total: 3 g/dL (ref 2.2–3.9)
Total Protein ELP: 6.5 g/dL (ref 6.0–8.5)

## 2016-10-14 LAB — LIPID PANEL
CHOL/HDL RATIO: 5.1 ratio
CHOLESTEROL: 132 mg/dL (ref 0–200)
HDL: 26 mg/dL — AB (ref >40)
LDL Cholesterol: 57 mg/dL (ref 0–99)
Triglycerides: 247 mg/dL — ABNORMAL HIGH (ref <150)
VLDL: 49 mg/dL — AB (ref 0–40)

## 2016-10-14 LAB — MPO/PR-3 (ANCA) ANTIBODIES
ANCA Proteinase 3: <3.5 U/mL (ref 0.0–3.5)
Myeloperoxidase Abs: <9 U/mL (ref 0.0–9.0)

## 2016-10-14 LAB — KAPPA/LAMBDA LIGHT CHAINS
Kappa free light chain: 103.7 mg/L — ABNORMAL HIGH (ref 3.3–19.4)
Kappa, lambda light chain ratio: 2.08 — ABNORMAL HIGH (ref 0.26–1.65)
Lambda free light chains: 49.9 mg/L — ABNORMAL HIGH (ref 5.7–26.3)

## 2016-10-14 LAB — HEMOGLOBIN A1C
HEMOGLOBIN A1C: 5.8 % — AB (ref 4.8–5.6)
MEAN PLASMA GLUCOSE: 120 mg/dL

## 2016-10-14 LAB — BASIC METABOLIC PANEL
Anion gap: 11 (ref 5–15)
BUN: 41 mg/dL — ABNORMAL HIGH (ref 6–20)
CALCIUM: 7.8 mg/dL — AB (ref 8.9–10.3)
CHLORIDE: 106 mmol/L (ref 101–111)
CO2: 20 mmol/L — AB (ref 22–32)
CREATININE: 4.51 mg/dL — AB (ref 0.44–1.00)
GFR calc non Af Amer: 9 mL/min — ABNORMAL LOW (ref >60)
GFR, EST AFRICAN AMERICAN: 11 mL/min — AB (ref >60)
GLUCOSE: 170 mg/dL — AB (ref 65–99)
Potassium: 3.5 mmol/L (ref 3.5–5.1)
Sodium: 137 mmol/L (ref 135–145)

## 2016-10-14 LAB — HEPATITIS PANEL, ACUTE
HCV Ab: <0.1 s/co ratio (ref 0.0–0.9)
Hep A IgM: NEGATIVE
Hep B C IgM: NEGATIVE
Hepatitis B Surface Ag: NEGATIVE

## 2016-10-14 LAB — ECHOCARDIOGRAM COMPLETE
HEIGHTINCHES: 65 in
WEIGHTICAEL: 3209.9 [oz_av]

## 2016-10-14 LAB — CBC
HCT: 26.9 % — ABNORMAL LOW (ref 36.0–46.0)
Hemoglobin: 9.3 g/dL — ABNORMAL LOW (ref 12.0–15.0)
MCH: 29.5 pg (ref 26.0–34.0)
MCHC: 34.6 g/dL (ref 30.0–36.0)
MCV: 85.4 fL (ref 78.0–100.0)
PLATELETS: 332 10*3/uL (ref 150–400)
RBC: 3.15 MIL/uL — AB (ref 3.87–5.11)
RDW: 14.8 % (ref 11.5–15.5)
WBC: 16.7 10*3/uL — AB (ref 4.0–10.5)

## 2016-10-14 LAB — C4 COMPLEMENT: Complement C4, Body Fluid: 25 mg/dL (ref 14–44)

## 2016-10-14 LAB — PHOSPHORUS: Phosphorus: 5.9 mg/dL — ABNORMAL HIGH (ref 2.5–4.6)

## 2016-10-14 LAB — ALBUMIN: Albumin: 3.1 g/dL — ABNORMAL LOW (ref 3.5–5.0)

## 2016-10-14 LAB — CREATININE, URINE, 24 HOUR
Collection Interval-UCRE24: 24 hours
Creatinine, 24H Ur: 503 mg/d — ABNORMAL LOW (ref 600–1800)
Creatinine, Urine: 30.01 mg/dL
Urine Total Volume-UCRE24: 1675 mL

## 2016-10-14 LAB — C3 COMPLEMENT: C3 Complement: 154 mg/dL (ref 82–167)

## 2016-10-14 LAB — FANA STAINING PATTERNS: Homogeneous Pattern: 1:160 {titer} — ABNORMAL HIGH

## 2016-10-14 LAB — ANTI-DNA ANTIBODY, DOUBLE-STRANDED: ds DNA Ab: 1 IU/mL (ref 0–9)

## 2016-10-14 LAB — MAGNESIUM: Magnesium: 1.6 mg/dL — ABNORMAL LOW (ref 1.7–2.4)

## 2016-10-14 LAB — ANTINUCLEAR ANTIBODIES, IFA: ANA Ab, IFA: POSITIVE — AB

## 2016-10-14 LAB — GLOMERULAR BASEMENT MEMBRANE ANTIBODIES: GBM Ab: 2 units (ref 0–20)

## 2016-10-14 MED ORDER — SODIUM CHLORIDE 0.9 % IV SOLN
INTRAVENOUS | Status: DC
  Administered 2016-10-14 – 2016-10-18 (×7): via INTRAVENOUS

## 2016-10-14 MED ORDER — PANTOPRAZOLE SODIUM 40 MG PO TBEC
40.0000 mg | DELAYED_RELEASE_TABLET | Freq: Every day | ORAL | Status: DC
  Administered 2016-10-14 – 2016-10-20 (×7): 40 mg via ORAL
  Filled 2016-10-14 (×7): qty 1

## 2016-10-14 MED ORDER — PROMETHAZINE HCL 25 MG/ML IJ SOLN
12.5000 mg | Freq: Four times a day (QID) | INTRAMUSCULAR | Status: DC | PRN
  Administered 2016-10-14 – 2016-10-19 (×3): 12.5 mg via INTRAVENOUS
  Filled 2016-10-14 (×3): qty 1

## 2016-10-14 MED ORDER — HYDRALAZINE HCL 25 MG PO TABS
50.0000 mg | ORAL_TABLET | Freq: Three times a day (TID) | ORAL | Status: DC
Start: 2016-10-14 — End: 2016-10-20
  Administered 2016-10-14 – 2016-10-20 (×19): 50 mg via ORAL
  Filled 2016-10-14 (×19): qty 2

## 2016-10-14 NOTE — Progress Notes (Signed)
*  PRELIMINARY RESULTS* Echocardiogram 2D Echocardiogram has been performed.  Leavy Cella 10/14/2016, 4:06 PM

## 2016-10-14 NOTE — Progress Notes (Signed)
Progress Note  Patient Name: Kelli [name] Date of Encounter: 10/14/2016  Primary Cardiologist: Dr Lovena Le  Subjective   Denies any chest pain  Inpatient Medications    Scheduled Meds: . amLODipine  10 mg Oral Daily  . aspirin  81 mg Oral Daily  . atorvastatin  40 mg Oral q1800  . hydrALAZINE  25 mg Oral BID  . insulin aspart  0-9 Units Subcutaneous TID WC  . metoprolol succinate  50 mg Oral Daily  . nitroGLYCERIN  1 inch Topical Q8H  . sodium bicarbonate  650 mg Oral TID  . sodium chloride flush  3 mL Intravenous Q12H   Continuous Infusions: . sodium chloride    . sodium chloride 75 mL/hr at 10/14/16 0843  . furosemide Stopped (10/14/16 0618)   PRN Meds: sodium chloride, metoprolol tartrate, ondansetron (ZOFRAN) IV, promethazine, sodium chloride flush   Vital Signs    Vitals:   10/13/16 1656 10/13/16 2109 10/13/16 2124 10/14/16 0500  BP: (!) 188/86 (!) 159/92  (!) 210/87  Pulse:  89  88  Resp:  20  20  Temp:  98 F (36.7 C)  98.5 F (36.9 C)  TempSrc:  Oral    SpO2:  97% 93% 98%  Weight:    200 lb 9.9 oz (91 kg)  Height:        Intake/Output Summary (Last 24 hours) at 10/14/16 1049 Last data filed at 10/14/16 0900  Gross per 24 hour  Intake              972 ml  Output             1150 ml  Net             -178 ml   Filed Weights   10/13/16 0210 10/14/16 0500  Weight: 180 lb (81.6 kg) 200 lb 9.9 oz (91 kg)    Telemetry    NSR with a 30 beat run of atrial tachycardia - Personally Reviewed  ECG    No new EKG - Personally Reviewed  Physical Exam   GEN: No acute distress.  Pale, obese, bed bound Neck: No JVD Cardiac: RRR, no murmurs, rubs, or gallops.  Respiratory: decreased breath sounds GI: Soft, nontender, non-distended  MS: No edema; No deformity. Neuro:  Nonfocal, lt hemiparesis Psych: Normal affect   Labs    Chemistry Recent Labs Lab 10/13/16 0352 10/14/16 0611  NA 135 137  K 3.5 3.5  CL 108 106  CO2 16* 20*  GLUCOSE 172*  170*  BUN 39* 41*  CREATININE 4.50* 4.51*  CALCIUM 7.6* 7.8*  PROT 6.9  --   ALBUMIN 3.3* 3.1*  AST 13*  --   ALT 11*  --   ALKPHOS 68  --   BILITOT 0.4  --   GFRNONAA 9* 9*  GFRAA 11* 11*  ANIONGAP 11 11     Hematology Recent Labs Lab 10/13/16 0352 10/13/16 0427 10/13/16 0551 10/13/16 1951 10/14/16 0611  WBC 13.8*  --  16.5*  --  16.7*  RBC 2.25* 2.26* 2.42*  --  3.15*  HGB 6.3*  --  6.9* 9.1* 9.3*  HCT 19.2*  --  20.8* 26.7* 26.9*  MCV 85.3  --  86.0  --  85.4  MCH 28.0  --  28.5  --  29.5  MCHC 32.8  --  33.2  --  34.6  RDW 14.9  --  15.0  --  14.8  PLT 299  --  376  --  332    Cardiac Enzymes Recent Labs Lab 10/13/16 0352 10/13/16 0659 10/13/16 1156 10/13/16 1951  TROPONINI 0.06* 0.06* 0.06* 0.05*   No results for input(s): TROPIPOC in the last 168 hours.   BNP Recent Labs Lab 10/13/16 0352  BNP 383.0*     DDimer  Recent Labs Lab 10/13/16 0352  DDIMER 1.06*     Radiology    US Renal  Result Date: 10/13/2016 CLINICAL DATA:  Acute on chronic renal failure EXAM: RENAL / URINARY TRACT ULTRASOUND COMPLETE COMPARISON:  Renal ultrasound of January 17, 2013. FINDINGS: Right Kidney: Length: 11.4 cm. The renal cortical echotexture is increased and is approximately equal to that of the liver. There is diffuse cortical thinning. There is no hydronephrosis. Left Kidney: Length: Bowel gas obscures the left kidney. Bladder: Appears normal for degree of bladder distention. IMPRESSION: Nonvisualization of the left kidney likely secondary to increased bowel gas on the left. Cortical thinning and increased renal cortical echotexture the right kidney is consistent with medical renal disease. No hydronephrosis is observed. Electronically Signed   By: David  Martinique M.D.   On: 10/13/2016 15:41   US Venous Img Lower Bilateral  Result Date: 10/13/2016 CLINICAL DATA:  Bilateral lower extremity pain and swelling, initial encounter EXAM: BILATERAL LOWER EXTREMITY VENOUS  DOPPLER ULTRASOUND TECHNIQUE: Gray-scale sonography with graded compression, as well as color Doppler and duplex ultrasound were performed to evaluate the lower extremity deep venous systems from the level of the common femoral vein and including the common femoral, femoral, profunda femoral, popliteal and calf veins including the posterior tibial, peroneal and gastrocnemius veins when visible. The superficial great saphenous vein was also interrogated. Spectral Doppler was utilized to evaluate flow at rest and with distal augmentation maneuvers in the common femoral, femoral and popliteal veins. COMPARISON:  None. FINDINGS: RIGHT LOWER EXTREMITY Common Femoral Vein: No evidence of thrombus. Normal compressibility, respiratory phasicity and response to augmentation. Saphenofemoral Junction: No evidence of thrombus. Normal compressibility and flow on color Doppler imaging. Profunda Femoral Vein: No evidence of thrombus. Normal compressibility and flow on color Doppler imaging. Femoral Vein: No evidence of thrombus. Normal compressibility, respiratory phasicity and response to augmentation. Popliteal Vein: No evidence of thrombus. Normal compressibility, respiratory phasicity and response to augmentation. Calf Veins: No evidence of thrombus. Normal compressibility and flow on color Doppler imaging. Superficial Great Saphenous Vein: No evidence of thrombus. Normal compressibility and flow on color Doppler imaging. Venous Reflux:  None. Other Findings:  Mild calf edema is noted. LEFT LOWER EXTREMITY Common Femoral Vein: No evidence of thrombus. Normal compressibility, respiratory phasicity and response to augmentation. Saphenofemoral Junction: No evidence of thrombus. Normal compressibility and flow on color Doppler imaging. Profunda Femoral Vein: No evidence of thrombus. Normal compressibility and flow on color Doppler imaging. Femoral Vein: No evidence of thrombus. Normal compressibility, respiratory phasicity and  response to augmentation. Popliteal Vein: No evidence of thrombus. Normal compressibility, respiratory phasicity and response to augmentation. Calf Veins: No evidence of thrombus. Normal compressibility and flow on color Doppler imaging. Superficial Great Saphenous Vein: No evidence of thrombus. Normal compressibility and flow on color Doppler imaging. Venous Reflux:  None. Other Findings:  Mild calf edema is noted. IMPRESSION: No evidence of DVT within either lower extremity. Electronically Signed   By: Inez Catalina M.D.   On: 10/13/2016 15:47   Dg Chest Port 1 View  Result Date: 10/13/2016 CLINICAL DATA:  Shortness of breath and chest tightness EXAM: PORTABLE CHEST 1 VIEW COMPARISON:  04/26/2014 FINDINGS: Moderate cardiomegaly  with globular cardiac configuration, increased compared to prior radiograph. Central vascular congestion and mild interstitial perihilar edema. No pleural effusion. Aortic atherosclerosis. No pneumothorax. Electronic device over the left chest. IMPRESSION: Moderate cardiomegaly with globular cardiac configuration, findings could be secondary to multi chamber enlargement or pericardial effusion. There is central vascular congestion and mild perihilar edema. Electronically Signed   By: Donavan Foil M.D.   On: 10/13/2016 03:25    Cardiac Studies   Echo pending  Patient Profile     66 y.o. female with a history of a prior debilitating stroke in 2015. She had ulcerative aortic plaque in that was felt to be the possible source of her stroke. DAP was recommended. Loop recorder was unrevealing. 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 Other problems include CRI, HTN, and HLD. She was admitted to [facility] 10/13/16 with a GI bleed and acute on CRI.   Assessment & Plan    1. Anemia on adm with hemoglobin of 6.3 presumed GI bleed. She had PUD on endoscopy in 2016. Stool was negative on adm. The family says the pt had stopped ASA and Plavix a year ago (?). Hgb  now 9.3.  2. Chest pain in the setting of profound anemia has resolved, troponins elevated but flat at 0.06 most likely secondary to demand ischemia. No history of CAD. Low risk Myoview in 2015. 3. Acute CHF in the setting of profound anemia. History of normal LVEF in 2015. Repeat 2-D echo pending. Needs IV diuresis but need to be very careful with acute renal failure. Now on 120 mg IV every 8 hours. I/O + 400. 4. Acute on chronic renal insuficiency with creatinine of 4.5 being followed by Dr Lowanda Foster 5. History of 3 cryptogenic strokes with left hemiparesis loop recorder has never documented atrial fibrillation since 2015. 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 6. Hypertension uncontrolled. Hydralazine 25 mg twice a day, metoprolol 50 mg daily and amlodipine 10 mg daily. May need further adjustments. 7. HLD on statin 8.   Diabetes mellitus metformin stopped secondary to acute renal insuficiency   Plan: Stool negative but will add PPI with history of PUD. IV Lasix per renal. Echo pending.   Will review strips with MD-doubt it will change recommendations regarding anticoagulation.   Signed, Kerin Ransom, PA-C  10/14/2016, 10:49 AM    The patient was seen and examined, and I agree with the history, physical exam, assessment and plan as documented above, with modifications as noted below. I have also personally reviewed all relevant documentation, old records, labs, and both radiographic and cardiovascular studies. I have also independently interpreted old and new ECG's.  She denies chest pain. Shortness of breath has markedly improved. Renal now following and administering IV fluids. Echocardiogram to be performed this afternoon. Telemetry shows PSVT and NSVT. She has had a few palpitations but says they are worse when she is anxious. Continue Toprol-XL at current dose. She is now on ASA. BP is elevated and goal BP given CVA is 130/80. I will increase  hydralazine to 50 mg TID.    Kate Sable, MD, [facility]  10/14/2016 12:23 PM

## 2016-10-14 NOTE — Progress Notes (Signed)
Subjective: Interval History: has complaints of appetite and some nausea but no vomiting. This has been going on for the last 3 years. Her breathing is better. Complains of feeling weak.  Objective: Vital signs in last 24 hours: Temp:  [98 F (36.7 C)-99.6 F (37.6 C)] 98.5 F (36.9 C) (08/16 0500) Pulse Rate:  [68-89] 88 (08/16 0500) Resp:  [20-28] 20 (08/16 0500) BP: (159-210)/(60-92) 210/87 (08/16 0500) SpO2:  [86 %-98 %] 98 % (08/16 0500) Weight:  [91 kg (200 lb 9.9 oz)] 91 kg (200 lb 9.9 oz) (08/16 0500) Weight change: 9.353 kg (20 lb 9.9 oz)  Intake/Output from previous day: 08/15 0701 - 08/16 0700 In: 1317 [I.V.:250; Blood:1005; IV Piggyback:62] Out: 1150 [Urine:1150] Intake/Output this shift: No intake/output data recorded.  Generally: Patient is sleepy but arousable. Patient and her has nondistended rash was not sleeping the last couple of days. Patient answered questions appropriately. Chest: Decreased breath sound otherwise seems to be clear Heart exam regular rate and rhythm no murmur Extremities: She has trace edema  Lab Results:  Recent Labs  10/13/16 0551 10/13/16 1951 10/14/16 0611  WBC 16.5*  --  16.7*  HGB 6.9* 9.1* 9.3*  HCT 20.8* 26.7* 26.9*  PLT 376  --  332   BMET:  Recent Labs  10/13/16 0352 10/14/16 0611  NA 135 137  K 3.5 3.5  CL 108 106  CO2 16* 20*  GLUCOSE 172* 170*  BUN 39* 41*  CREATININE 4.50* 4.51*  CALCIUM 7.6* 7.8*   No results for input(s): PTH in the last 72 hours. Iron Studies:  Recent Labs  10/13/16 0659  IRON 33  TIBC 293  FERRITIN 67    Studies/Results: US Renal  Result Date: 10/13/2016 CLINICAL DATA:  Acute on chronic renal failure EXAM: RENAL / URINARY TRACT ULTRASOUND COMPLETE COMPARISON:  Renal ultrasound of January 17, 2013. FINDINGS: Right Kidney: Length: 11.4 cm. The renal cortical echotexture is increased and is approximately equal to that of the liver. There is diffuse cortical thinning. There is no  hydronephrosis. Left Kidney: Length: Bowel gas obscures the left kidney. Bladder: Appears normal for degree of bladder distention. IMPRESSION: Nonvisualization of the left kidney likely secondary to increased bowel gas on the left. Cortical thinning and increased renal cortical echotexture the right kidney is consistent with medical renal disease. No hydronephrosis is observed. Electronically Signed   By: David  Martinique M.D.   On: 10/13/2016 15:41   US Venous Img Lower Bilateral  Result Date: 10/13/2016 CLINICAL DATA:  Bilateral lower extremity pain and swelling, initial encounter EXAM: BILATERAL LOWER EXTREMITY VENOUS DOPPLER ULTRASOUND TECHNIQUE: Gray-scale sonography with graded compression, as well as color Doppler and duplex ultrasound were performed to evaluate the lower extremity deep venous systems from the level of the common femoral vein and including the common femoral, femoral, profunda femoral, popliteal and calf veins including the posterior tibial, peroneal and gastrocnemius veins when visible. The superficial great saphenous vein was also interrogated. Spectral Doppler was utilized to evaluate flow at rest and with distal augmentation maneuvers in the common femoral, femoral and popliteal veins. COMPARISON:  None. FINDINGS: RIGHT LOWER EXTREMITY Common Femoral Vein: No evidence of thrombus. Normal compressibility, respiratory phasicity and response to augmentation. Saphenofemoral Junction: No evidence of thrombus. Normal compressibility and flow on color Doppler imaging. Profunda Femoral Vein: No evidence of thrombus. Normal compressibility and flow on color Doppler imaging. Femoral Vein: No evidence of thrombus. Normal compressibility, respiratory phasicity and response to augmentation. Popliteal Vein: No evidence of  thrombus. Normal compressibility, respiratory phasicity and response to augmentation. Calf Veins: No evidence of thrombus. Normal compressibility and flow on color Doppler imaging.  Superficial Great Saphenous Vein: No evidence of thrombus. Normal compressibility and flow on color Doppler imaging. Venous Reflux:  None. Other Findings:  Mild calf edema is noted. LEFT LOWER EXTREMITY Common Femoral Vein: No evidence of thrombus. Normal compressibility, respiratory phasicity and response to augmentation. Saphenofemoral Junction: No evidence of thrombus. Normal compressibility and flow on color Doppler imaging. Profunda Femoral Vein: No evidence of thrombus. Normal compressibility and flow on color Doppler imaging. Femoral Vein: No evidence of thrombus. Normal compressibility, respiratory phasicity and response to augmentation. Popliteal Vein: No evidence of thrombus. Normal compressibility, respiratory phasicity and response to augmentation. Calf Veins: No evidence of thrombus. Normal compressibility and flow on color Doppler imaging. Superficial Great Saphenous Vein: No evidence of thrombus. Normal compressibility and flow on color Doppler imaging. Venous Reflux:  None. Other Findings:  Mild calf edema is noted. IMPRESSION: No evidence of DVT within either lower extremity. Electronically Signed   By: Inez Catalina M.D.   On: 10/13/2016 15:47   Dg Chest Port 1 View  Result Date: 10/13/2016 CLINICAL DATA:  Shortness of breath and chest tightness EXAM: PORTABLE CHEST 1 VIEW COMPARISON:  04/26/2014 FINDINGS: Moderate cardiomegaly with globular cardiac configuration, increased compared to prior radiograph. Central vascular congestion and mild interstitial perihilar edema. No pleural effusion. Aortic atherosclerosis. No pneumothorax. Electronic device over the left chest. IMPRESSION: Moderate cardiomegaly with globular cardiac configuration, findings could be secondary to multi chamber enlargement or pericardial effusion. There is central vascular congestion and mild perihilar edema. Electronically Signed   By: Donavan Foil M.D.   On: 10/13/2016 03:25    I have reviewed the patient's current  medications.  Assessment/Plan: Problem #1 renal failure: Acute on chronic versus natural progression of her disease. At this moment superimposed prerenal syndrome versus ATN cannot be ruled out. Problem #2 chronic renal failure: Stage III. Etiology could be ischemic/diabetes/hypertension/recurrent acute kidney injury/patient also with recurrent ureteral tract infection treated with different antibiotics. Patient was seen by urology at one time. Ultrasound of the kidney was not able to visualize left kidney. However ultrasound from 2014 showed left kidney to be 8.7 cm with the right one about 10.4. Problem #3 anemia: Patient was previous history of GI bleeding and anemia. At this moment possibly a combination of iron deficiency anemia and anemia move chronic disease.  Problem #4 bone mineral disorder: Hypercalcemia in range but phosphorus is high 5.9. Her PTH is pending Problem #5 history of CVA: According to her chart she has 3 episodes. Problem #6 history of cardiac erythema: She has previous history of loop recorder implant. Problem #7 nausea and poor appetite etiology not clear. According to her husband she has this problem for more than 2 years. Her creatinine about 2 years ago was about 1.4-1.5 with a stage III chronic renal failure hence the etiology could be possibly secondary to GI. Problem #8 difficulty breathing: Presently feeling better she had more than 1100 mL of urine output. Chest x-ray moderate cardiomegaly, questionable pleural effusion. Results of echocardiogram is pending. Plan: We'll start patient on normal saline at 75 mL per hour 2] to continue with Lasix 3] we'll check her renal panel. 4] I have discussed with the patient and also her husband. If her renal function doesn't improve then we may need to consider dialysis. Presently patient seems to be stable and no urgent reason for dialysis.  LOS: 1 day   Eevie Lapp S 10/14/2016,8:20 AM

## 2016-10-14 NOTE — Progress Notes (Signed)
PROGRESS NOTE  Kelli [name] AQT:[id number] DOB: [birth year] DOA: 10/13/2016 PCP: Manon Hilding, MD    Brief History:  66 year old female with a history of stroke with residual left hemiparesis, CKD stage III, essential hypertension, diabetes mellitus, peripheral vascular disease presented when she woke up with substernal chest discomfort in the early morning of 10/13/2016. The patient has some associated shortness of breath and diaphoresis. She denies any fevers, chills, headache, neck pain, dysuria, abdominal pain, hematochezia, melena. The patient states that she often feels nauseous when she first wakes up in the morning resulting in an episode of emesis. This has happened for many years. Upon presentation, the patient was noted to have hemoglobin of 6.3 and serum creatinine of 4.50. Chest x-ray showed central vascular congestion and interstitial edema. The patient was started on intravenous furosemide and admitted for further workup of her renal failure.  Assessment/Plan: Acute on chronic diastolic CHF -daily weights--question accuracy--neg 20 lbs in past 24 hours -accurate I/O's--not accurate -fluid restrict -Echo--EF 55-60%, grade 2 DD, no WMA. -continue lasix per nephrology -cannot use ACEi due to renal failure -04/16/2013--Echo--EF 55-60%  Acute respiratory failure with hypoxia -due to CHF -stable on 3 L -wean oxygen for saturation >92%  Atypical chest pain/Elevated troponin -appreciate cardiology -troponin trend flat -pt also has loop recorder placed Feb 2015 after stroke -elevated troponin due to decompensated CHF  Acute on chronic renal failure--CKD stage 3 -baseline creatinine 1.2-1.5 but no recent BMP -last serum creatinine 1.49 on 04/29/2014 -History of likely has progression of her underlying CKD with acute worsening possibly due to hemodynamic factors from CHF. -Appreciate nephrology follow-up -I Contacted the patient's PCP--no blood work since 2015  in office record -renal US--nonvisualization of the left kidney secondary to bowel gas. Right renal echogenicity without hydronephrosis. -monitor with diuresis -check CK--20  Anemia of CKD--symptomatic anemia -check anemia panel -transfused 1 unit -transfuse second unit -FOBT neg -Iron saturation 11%, ferritin 67, folic acid 8.7  Essential HTN-uncontrolled -continue metoprolol succinate -cannot use ACEi due to renal failure -continue amlodipine and hydralazine -increased hydralazine dose -anticipate improvement with diuresis  Hx of Stroke with left hemiparesis  -continue ASA -check lipid panel--LDL57 -pt has loop recorder placed Feb 2015 -continue statin  Diabetes mellitus -d/c metformin altogether due to renal failure -check A1C -CBGs controlled  Hyperlipidemia -continue statin  Leg edema and pain -venous duplex--neg     Disposition Plan:   Home in 2-3 days if cleared by renal/cardiology Family Communication:   sister  And son updated at bedside 8/16   Consultants:  cardiology, nephrology  Code Status:  FULL   DVT Prophylaxis:  [place] Heparin   Procedures: As Listed in Progress Note Above  Antibiotics: None    Subjective: Patient is breathing better. She denies any chest pain, nausea, vomiting, diarrhea, abdominal pain, dysuria, hematuria. No rashes.  Objective: Vitals:   10/14/16 1125 10/14/16 1130 10/14/16 1413 10/14/16 1447  BP: (!) 172/110 (!) 142/98 (!) 151/94 (!) 188/86  Pulse:   86   Resp:   20   Temp:   98.9 F (37.2 C)   TempSrc:   Oral   SpO2:   98%   Weight:      Height:        Intake/Output Summary (Last 24 hours) at 10/14/16 1816 Last data filed at 10/14/16 1300  Gross per 24 hour  Intake  480 ml  Output              800 ml  Net             -320 ml   Weight change: 9.353 kg (20 lb 9.9 oz) Exam:   General:  Pt is alert, follows commands appropriately, not in acute distress  HEENT: No  icterus, No thrush, No neck mass, [place]/AT  Cardiovascular: RRR, S1/S2, no rubs, no gallops  Respiratory:   Abdomen: Soft/+BS, non tender, non distended, no guarding  Extremities: No edema, No lymphangitis, No petechiae, No rashes, no synovitis   Data Reviewed: I have personally reviewed following labs and imaging studies Basic Metabolic Panel:  Recent Labs Lab 10/13/16 0352 10/14/16 0611  NA 135 137  K 3.5 3.5  CL 108 106  CO2 16* 20*  GLUCOSE 172* 170*  BUN 39* 41*  CREATININE 4.50* 4.51*  CALCIUM 7.6* 7.8*  MG  --  1.6*  PHOS  --  5.9*   Liver Function Tests:  Recent Labs Lab 10/13/16 0352 10/14/16 0611  AST 13*  --   ALT 11*  --   ALKPHOS 68  --   BILITOT 0.4  --   PROT 6.9  --   ALBUMIN 3.3* 3.1*   No results for input(s): LIPASE, AMYLASE in the last 168 hours. No results for input(s): AMMONIA in the last 168 hours. Coagulation Profile: No results for input(s): INR, PROTIME in the last 168 hours. CBC:  Recent Labs Lab 10/13/16 0352 10/13/16 0551 10/13/16 1951 10/14/16 0611  WBC 13.8* 16.5*  --  16.7*  NEUTROABS 11.5*  --   --   --   HGB 6.3* 6.9* 9.1* 9.3*  HCT 19.2* 20.8* 26.7* 26.9*  MCV 85.3 86.0  --  85.4  PLT 299 376  --  332   Cardiac Enzymes:  Recent Labs Lab 10/13/16 0352 10/13/16 0551 10/13/16 0659 10/13/16 1156 10/13/16 1951  CKTOTAL  --  20*  --   --   --   TROPONINI 0.06*  --  0.06* 0.06* 0.05*   BNP: Invalid input(s): POCBNP CBG:  Recent Labs Lab 10/13/16 1637 10/13/16 2132 10/14/16 0724 10/14/16 1139 10/14/16 1645  GLUCAP 165* 150* 158* 143* 142*   HbA1C:  Recent Labs  10/13/16 0451  HGBA1C 5.8*   Urine analysis:    Component Value Date/Time   COLORURINE YELLOW 10/13/2016 0640   APPEARANCEUR HAZY (A) 10/13/2016 0640   LABSPEC 1.009 10/13/2016 0640   PHURINE 5.0 10/13/2016 0640   GLUCOSEU 50 (A) 10/13/2016 0640   HGBUR NEGATIVE 10/13/2016 0640   BILIRUBINUR NEGATIVE 10/13/2016 0640   KETONESUR  NEGATIVE 10/13/2016 0640   PROTEINUR 100 (A) 10/13/2016 0640   UROBILINOGEN 0.2 04/25/2014 2315   NITRITE NEGATIVE 10/13/2016 0640   LEUKOCYTESUR LARGE (A) 10/13/2016 0640   Sepsis Labs: @LABRCNTIP (procalcitonin:4,lacticidven:4) )No results found for this or any previous visit (from the past 240 hour(s)).   Scheduled Meds: . amLODipine  10 mg Oral Daily  . aspirin  81 mg Oral Daily  . atorvastatin  40 mg Oral q1800  . hydrALAZINE  50 mg Oral Q8H  . insulin aspart  0-9 Units Subcutaneous TID WC  . metoprolol succinate  50 mg Oral Daily  . nitroGLYCERIN  1 inch Topical Q8H  . pantoprazole  40 mg Oral Q0600  . sodium bicarbonate  650 mg Oral TID  . sodium chloride flush  3 mL Intravenous Q12H   Continuous Infusions: . sodium chloride    .  sodium chloride 75 mL/hr at 10/14/16 0843  . furosemide Stopped (10/14/16 1656)    Procedures/Studies: US Renal  Result Date: 10/13/2016 CLINICAL DATA:  Acute on chronic renal failure EXAM: RENAL / URINARY TRACT ULTRASOUND COMPLETE COMPARISON:  Renal ultrasound of January 17, 2013. FINDINGS: Right Kidney: Length: 11.4 cm. The renal cortical echotexture is increased and is approximately equal to that of the liver. There is diffuse cortical thinning. There is no hydronephrosis. Left Kidney: Length: Bowel gas obscures the left kidney. Bladder: Appears normal for degree of bladder distention. IMPRESSION: Nonvisualization of the left kidney likely secondary to increased bowel gas on the left. Cortical thinning and increased renal cortical echotexture the right kidney is consistent with medical renal disease. No hydronephrosis is observed. Electronically Signed   By: Jonan Seufert  [place] M.D.   On: 10/13/2016 15:41   US Venous Img Lower Bilateral  Result Date: 10/13/2016 CLINICAL DATA:  Bilateral lower extremity pain and swelling, initial encounter EXAM: BILATERAL LOWER EXTREMITY VENOUS DOPPLER ULTRASOUND TECHNIQUE: Gray-scale sonography with graded compression,  as well as color Doppler and duplex ultrasound were performed to evaluate the lower extremity deep venous systems from the level of the common femoral vein and including the common femoral, femoral, profunda femoral, popliteal and calf veins including the posterior tibial, peroneal and gastrocnemius veins when visible. The superficial great saphenous vein was also interrogated. Spectral Doppler was utilized to evaluate flow at rest and with distal augmentation maneuvers in the common femoral, femoral and popliteal veins. COMPARISON:  None. FINDINGS: RIGHT LOWER EXTREMITY Common Femoral Vein: No evidence of thrombus. Normal compressibility, respiratory phasicity and response to augmentation. Saphenofemoral Junction: No evidence of thrombus. Normal compressibility and flow on color Doppler imaging. Profunda Femoral Vein: No evidence of thrombus. Normal compressibility and flow on color Doppler imaging. Femoral Vein: No evidence of thrombus. Normal compressibility, respiratory phasicity and response to augmentation. Popliteal Vein: No evidence of thrombus. Normal compressibility, respiratory phasicity and response to augmentation. Calf Veins: No evidence of thrombus. Normal compressibility and flow on color Doppler imaging. Superficial Great Saphenous Vein: No evidence of thrombus. Normal compressibility and flow on color Doppler imaging. Venous Reflux:  None. Other Findings:  Mild calf edema is noted. LEFT LOWER EXTREMITY Common Femoral Vein: No evidence of thrombus. Normal compressibility, respiratory phasicity and response to augmentation. Saphenofemoral Junction: No evidence of thrombus. Normal compressibility and flow on color Doppler imaging. Profunda Femoral Vein: No evidence of thrombus. Normal compressibility and flow on color Doppler imaging. Femoral Vein: No evidence of thrombus. Normal compressibility, respiratory phasicity and response to augmentation. Popliteal Vein: No evidence of thrombus. Normal  compressibility, respiratory phasicity and response to augmentation. Calf Veins: No evidence of thrombus. Normal compressibility and flow on color Doppler imaging. Superficial Great Saphenous Vein: No evidence of thrombus. Normal compressibility and flow on color Doppler imaging. Venous Reflux:  None. Other Findings:  Mild calf edema is noted. IMPRESSION: No evidence of DVT within either lower extremity. Electronically Signed   By: Inez Catalina M.D.   On: 10/13/2016 15:47   Dg Chest Port 1 View  Result Date: 10/13/2016 CLINICAL DATA:  Shortness of breath and chest tightness EXAM: PORTABLE CHEST 1 VIEW COMPARISON:  04/26/2014 FINDINGS: Moderate cardiomegaly with globular cardiac configuration, increased compared to prior radiograph. Central vascular congestion and mild interstitial perihilar edema. No pleural effusion. Aortic atherosclerosis. No pneumothorax. Electronic device over the left chest. IMPRESSION: Moderate cardiomegaly with globular cardiac configuration, findings could be secondary to multi chamber enlargement or pericardial effusion. There is central  vascular congestion and mild perihilar edema. Electronically Signed   By: Donavan Foil M.D.   On: 10/13/2016 03:25    Myleah Cavendish, DO  [facility] Pager [phone]  If 7PM-7AM, please contact night-coverage [email or web address] Password [email or web address] 10/14/2016, 6:16 PM   LOS: 1 day

## 2016-10-14 NOTE — Progress Notes (Signed)
Pt has external catheter in place but has leaked a significant amount of urine onto the bed. Will reposition after cleaning to see if more accurate I&O is possible with external catheter.

## 2016-10-15 DIAGNOSIS — G8110 Spastic hemiplegia affecting unspecified side: Secondary | ICD-10-CM

## 2016-10-15 LAB — GLUCOSE, CAPILLARY
GLUCOSE-CAPILLARY: 189 mg/dL — AB (ref 65–99)
GLUCOSE-CAPILLARY: 160 mg/dL — AB (ref 65–99)
Glucose-Capillary: 160 mg/dL — ABNORMAL HIGH (ref 65–99)
Glucose-Capillary: 184 mg/dL — ABNORMAL HIGH (ref 65–99)

## 2016-10-15 LAB — PTH, INTACT AND CALCIUM
Calcium, Total (PTH): 7.6 mg/dL — ABNORMAL LOW (ref 8.7–10.3)
PTH: 162 pg/mL — ABNORMAL HIGH (ref 15–65)

## 2016-10-15 LAB — IRON AND TIBC
IRON: 39 ug/dL (ref 28–170)
Saturation Ratios: 14 % (ref 10.4–31.8)
TIBC: 272 ug/dL (ref 250–450)
UIBC: 233 ug/dL

## 2016-10-15 LAB — CBC
HCT: 28.2 % — ABNORMAL LOW (ref 36.0–46.0)
HEMOGLOBIN: 9.6 g/dL — AB (ref 12.0–15.0)
MCH: 29.3 pg (ref 26.0–34.0)
MCHC: 34 g/dL (ref 30.0–36.0)
MCV: 86 fL (ref 78.0–100.0)
Platelets: 331 10*3/uL (ref 150–400)
RBC: 3.28 MIL/uL — AB (ref 3.87–5.11)
RDW: 14.8 % (ref 11.5–15.5)
WBC: 13.4 10*3/uL — AB (ref 4.0–10.5)

## 2016-10-15 LAB — RENAL FUNCTION PANEL
ALBUMIN: 3.1 g/dL — AB (ref 3.5–5.0)
ANION GAP: 13 (ref 5–15)
BUN: 47 mg/dL — AB (ref 6–20)
CALCIUM: 7.6 mg/dL — AB (ref 8.9–10.3)
CO2: 21 mmol/L — ABNORMAL LOW (ref 22–32)
Chloride: 103 mmol/L (ref 101–111)
Creatinine, Ser: 4.61 mg/dL — ABNORMAL HIGH (ref 0.44–1.00)
GFR calc Af Amer: 10 mL/min — ABNORMAL LOW (ref >60)
GFR calc non Af Amer: 9 mL/min — ABNORMAL LOW (ref >60)
GLUCOSE: 171 mg/dL — AB (ref 65–99)
PHOSPHORUS: 5.6 mg/dL — AB (ref 2.5–4.6)
Potassium: 3.3 mmol/L — ABNORMAL LOW (ref 3.5–5.1)
SODIUM: 137 mmol/L (ref 135–145)

## 2016-10-15 LAB — ANCA TITERS
Atypical P-ANCA titer: <1:20 {titer}
C-ANCA: <1:20 {titer}
P-ANCA: <1:20 {titer}

## 2016-10-15 LAB — HEMOGLOBIN A1C
Hgb A1c MFr Bld: 5.7 % — ABNORMAL HIGH (ref 4.8–5.6)
Mean Plasma Glucose: 116.89 mg/dL

## 2016-10-15 LAB — FERRITIN: FERRITIN: 155 ng/mL (ref 11–307)

## 2016-10-15 LAB — METANEPHRINES, PLASMA
Metanephrine, Free: 22 pg/mL (ref 0–62)
Normetanephrine, Free: 385 pg/mL — ABNORMAL HIGH (ref 0–145)

## 2016-10-15 MED ORDER — ACETAMINOPHEN 325 MG PO TABS
650.0000 mg | ORAL_TABLET | Freq: Four times a day (QID) | ORAL | Status: DC | PRN
  Administered 2016-10-15: 650 mg via ORAL
  Filled 2016-10-15: qty 2

## 2016-10-15 MED ORDER — ALBUTEROL SULFATE (2.5 MG/3ML) 0.083% IN NEBU
2.5000 mg | INHALATION_SOLUTION | Freq: Two times a day (BID) | RESPIRATORY_TRACT | Status: DC
  Administered 2016-10-15 – 2016-10-20 (×10): 2.5 mg via RESPIRATORY_TRACT
  Filled 2016-10-15 (×10): qty 3

## 2016-10-15 MED ORDER — POTASSIUM CHLORIDE CRYS ER 20 MEQ PO TBCR
40.0000 meq | EXTENDED_RELEASE_TABLET | Freq: Every day | ORAL | Status: DC
  Administered 2016-10-15 – 2016-10-20 (×6): 40 meq via ORAL
  Filled 2016-10-15 (×6): qty 2

## 2016-10-15 NOTE — Care Management Important Message (Signed)
Important Message  Patient Details  Name: Kelli [name] MRN: [id number] Date of Birth: 1950-12-20   Medicare Important Message Given:  Yes    Marlon Suleiman, Chauncey Reading, RN 10/15/2016, 2:19 PM

## 2016-10-15 NOTE — Care Management Note (Signed)
Case Management Note  Patient Details  Name: Kelli [name] MRN: [id number] Date of Birth: [birth year]  Subjective/Objective: Admit with anemia/CHF/RF. Patient from home with husband. Hx of stoke, Left hemiparesis. Nephrology and Cardiology following.               Action/Plan: CM following for needs. Anticipate HH needs at minimal. PT not consulted yet. Per nephrology notes, patient may need oncology consult.   Expected Discharge Date:   10/18/2016               Expected Discharge Plan:     In-House Referral:     Discharge planning Services  CM Consult  Post Acute Care Choice:    Choice offered to:     DME Arranged:    DME Agency:     HH Arranged:    HH Agency:     Status of Service:  In process, will continue to follow  If discussed at Long Length of Stay Meetings, dates discussed:    Additional Comments:  Castella Lerner, Chauncey Reading, RN 10/15/2016, 2:11 PM

## 2016-10-15 NOTE — Progress Notes (Signed)
Subjective: Interval History: Patient denies any nausea or vomiting. She denies also any difficulty breathing. Patient says that she feels is sleepy most of the time in the morning.  Objective: Vital signs in last 24 hours: Temp:  [98.2 F (36.8 C)-99.1 F (37.3 C)] 98.2 F (36.8 C) (08/17 0520) Pulse Rate:  [86-95] 95 (08/17 0520) Resp:  [18-20] 18 (08/17 0520) BP: (142-188)/(77-110) 159/80 (08/17 0520) SpO2:  [98 %] 98 % (08/17 0520) Weight:  [87.6 kg (193 lb 2 oz)] 87.6 kg (193 lb 2 oz) (08/17 0520) Weight change: -3.4 kg (-7 lb 7.9 oz)  Intake/Output from previous day: 08/16 0701 - 08/17 0700 In: 2219.3 [P.O.:600; I.V.:1371.3; IV Piggyback:248] Out: 7412 [Urine:1725] Intake/Output this shift: No intake/output data recorded.  Generally: Patient is sleepy but arousable. Patient and her has nondistended rash was not sleeping the last couple of days. Patient answered questions appropriately. Chest: Decreased breath sound otherwise seems to be clear Heart exam regular rate and rhythm no murmur Extremities: She has trace edema  Lab Results:  Recent Labs  10/14/16 0611 10/15/16 0624  WBC 16.7* 13.4*  HGB 9.3* 9.6*  HCT 26.9* 28.2*  PLT 332 331   BMET:   Recent Labs  10/14/16 0611 10/15/16 0624  NA 137 137  K 3.5 3.3*  CL 106 103  CO2 20* 21*  GLUCOSE 170* 171*  BUN 41* 47*  CREATININE 4.51* 4.61*  CALCIUM 7.8*  7.6* 7.6*    Recent Labs  10/14/16 0611  PTH 162*  Comment   Iron Studies:   Recent Labs  10/13/16 0659  IRON 33  TIBC 293  FERRITIN 67    Studies/Results: US Renal  Result Date: 10/13/2016 CLINICAL DATA:  Acute on chronic renal failure EXAM: RENAL / URINARY TRACT ULTRASOUND COMPLETE COMPARISON:  Renal ultrasound of January 17, 2013. FINDINGS: Right Kidney: Length: 11.4 cm. The renal cortical echotexture is increased and is approximately equal to that of the liver. There is diffuse cortical thinning. There is no hydronephrosis. Left  Kidney: Length: Bowel gas obscures the left kidney. Bladder: Appears normal for degree of bladder distention. IMPRESSION: Nonvisualization of the left kidney likely secondary to increased bowel gas on the left. Cortical thinning and increased renal cortical echotexture the right kidney is consistent with medical renal disease. No hydronephrosis is observed. Electronically Signed   By: David  Martinique M.D.   On: 10/13/2016 15:41   US Venous Img Lower Bilateral  Result Date: 10/13/2016 CLINICAL DATA:  Bilateral lower extremity pain and swelling, initial encounter EXAM: BILATERAL LOWER EXTREMITY VENOUS DOPPLER ULTRASOUND TECHNIQUE: Gray-scale sonography with graded compression, as well as color Doppler and duplex ultrasound were performed to evaluate the lower extremity deep venous systems from the level of the common femoral vein and including the common femoral, femoral, profunda femoral, popliteal and calf veins including the posterior tibial, peroneal and gastrocnemius veins when visible. The superficial great saphenous vein was also interrogated. Spectral Doppler was utilized to evaluate flow at rest and with distal augmentation maneuvers in the common femoral, femoral and popliteal veins. COMPARISON:  None. FINDINGS: RIGHT LOWER EXTREMITY Common Femoral Vein: No evidence of thrombus. Normal compressibility, respiratory phasicity and response to augmentation. Saphenofemoral Junction: No evidence of thrombus. Normal compressibility and flow on color Doppler imaging. Profunda Femoral Vein: No evidence of thrombus. Normal compressibility and flow on color Doppler imaging. Femoral Vein: No evidence of thrombus. Normal compressibility, respiratory phasicity and response to augmentation. Popliteal Vein: No evidence of thrombus. Normal compressibility, respiratory phasicity and  response to augmentation. Calf Veins: No evidence of thrombus. Normal compressibility and flow on color Doppler imaging. Superficial Great  Saphenous Vein: No evidence of thrombus. Normal compressibility and flow on color Doppler imaging. Venous Reflux:  None. Other Findings:  Mild calf edema is noted. LEFT LOWER EXTREMITY Common Femoral Vein: No evidence of thrombus. Normal compressibility, respiratory phasicity and response to augmentation. Saphenofemoral Junction: No evidence of thrombus. Normal compressibility and flow on color Doppler imaging. Profunda Femoral Vein: No evidence of thrombus. Normal compressibility and flow on color Doppler imaging. Femoral Vein: No evidence of thrombus. Normal compressibility, respiratory phasicity and response to augmentation. Popliteal Vein: No evidence of thrombus. Normal compressibility, respiratory phasicity and response to augmentation. Calf Veins: No evidence of thrombus. Normal compressibility and flow on color Doppler imaging. Superficial Great Saphenous Vein: No evidence of thrombus. Normal compressibility and flow on color Doppler imaging. Venous Reflux:  None. Other Findings:  Mild calf edema is noted. IMPRESSION: No evidence of DVT within either lower extremity. Electronically Signed   By: Inez Catalina M.D.   On: 10/13/2016 15:47    I have reviewed the patient's current medications.  Assessment/Plan: Problem #1 renal failure: Acute on chronic versus natural progression of her disease. At this moment superimposed prerenal syndrome versus ATN also may contribute to the problem. Her creatinine is slightly high than yesterday but overall stable. Possibly from fluid removal. Patient at this moment doesn't seem to have uremic sinus symptoms. Problem #2 chronic renal failure: Stage III. Etiology could be ischemic/diabetes/hypertension/recurrent acute kidney injury/patient also with recurrent ureteral tract infection treated with different antibiotics. Patient was seen by urology at one time. Ultrasound of the kidney was not able to visualize left kidney. However ultrasound from 2014 showed left kidney  to be 8.7 cm with the right one about 10.4. Problem #3 anemia: Patient was previous history of GI bleeding and anemia. At this moment possibly a combination of iron deficiency anemia and anemia move chronic disease.  Problem #4 bone mineral disorder: Her calcium in range but phosphorus and PTH is high. Problem #5 history of CVA: According to her chart she has 3 episodes. Problem #6 history of cardiac arythema: She has previous history of loop recorder implant. Problem #7 nausea and poor appetite etiology not clear. According to her husband she has this problem for more than 2 years. Her creatinine about 2 years ago  Problem #8 difficulty breathing: Presently feeling better . She has 1700 mL of urine output. Problem #9 history of paraproteinemia: Patient with elevated Kappa and lambda chain was high Kappa to lambda ratio. Presently she doesn't have any M spike. Problem #10 high ANA. Patient was normal complement. Anti-double-stranded DNA is negative. Hence most likely secondary to medications such as hydralazine. Plan: We'll increase IV fluids and his cc per hour 2] to continue with Lasix 3] we'll check her renal panel. 4] patient may benefit from oncology consult.   LOS: 2 days   Cardell Rachel S 10/15/2016,9:51 AM

## 2016-10-15 NOTE — Progress Notes (Addendum)
PROGRESS NOTE  Kelli [name] WNI:[id number] DOB: Nov 25, 1950 DOA: 10/13/2016 PCP: Manon Hilding, MD   Brief History: 66 year old female with a history of stroke with residual left hemiparesis, CKD stage III, essential hypertension, diabetes mellitus, peripheral vascular disease presented when she woke up with substernal chest discomfort in the early morning of 10/13/2016. The patient has some associated shortness of breath and diaphoresis. She denies any fevers, chills, headache, neck pain, dysuria, abdominal pain, hematochezia, melena. The patient states that she often feels nauseous when she first wakes up in the morning resulting in an episode of emesis. This has happened for many years. Upon presentation, the patient was noted to have hemoglobin of 6.3 and serum creatinine of 4.50. Chest x-ray showed central vascular congestion and interstitial edema. The patient was started on intravenous furosemide and admitted for further workup of her renal failure. Nephrology and cardiology were consulted to assist.  Assessment/Plan: Acute on chronic diastolic CHF -daily weights--NEG 7 lbs for the admission -accurate I/O's--not accurate -fluid restrict -Echo--EF 55-60%, grade 2 DD, no WMA. -continue lasix and fluids per nephrology -remains clinically fluid overloaded -cannot use ACEi due to renal failure -04/16/2013--Echo--EF 55-60%  Acute respiratory failure with hypoxia -due to CHF -stable on 3 L -wean oxygen for saturation >92%  Atypical chest pain/Elevated troponin -appreciate cardiology -troponin trend flat -pt also has loop recorder placed Feb 2015 after stroke -elevated troponin due to decompensated CHF  Acute on chronic renal failure--CKD stage 3 -baseline creatinine 1.2-1.5 but no recent BMP -last serum creatinine 1.49 on 04/29/2014 -History of likely has progression of her underlying CKD with acute worsening possibly due to hemodynamic factors from  CHF. -Appreciate nephrology follow-up -I Contacted the patient's PCP--no blood work since 2015 in office record -renal US--nonvisualization of the left kidney secondary to bowel gas. Right renal echogenicity without hydronephrosis. 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 Talbert Cage  Anemia of CKD--symptomatic anemia -check anemia panel -transfused 2 units PRBCs for the admssion -FOBT neg -Iron saturation 11%, ferritin 67, folic acid 8.7  Essential HTN-uncontrolled -continue metoprolol succinate -cannot use ACEi due to renal failure -continue amlodipine and hydralazine -increased hydralazine dose 50 mg q 8 hours -anticipate improvement with diuresis  Hx of Stroke with left hemiparesis  -continue ASA -check lipid panel--LDL57 -pt has loop recorder placed Feb 2015 -continue statin  Diabetes mellitus -d/c metformin altogether due to renal failure -check A1C--5.7 -CBGs controlled  Hyperlipidemia -continue statin  Leg edema and pain -venous duplex--neg  Hypokalemia -replete -check mag     Disposition Plan: Home in 2-3 days if cleared by renal/cardiology Family Communication: spouse updatedat bedside 8/17   Consultants: cardiology, nephrology  Code Status: FULL   DVT Prophylaxis: [place] Heparin   Procedures: As Listed in Progress Note Above  Antibiotics: None     Subjective: Patient states that she is breathing better. She denies fevers, chills, chest pain, nausea, vomiting, diarrhea. She has not had a bowel movement in 5 days. She denies any abdominal pain.  Objective: Vitals:   10/14/16 2210 10/15/16 0520 10/15/16 1010 10/15/16 1403  BP: (!) 150/77 (!) 159/80 (!) 159/95 (!) 154/84  Pulse: 93 95 (!) 111 94  Resp: 18 18  18   Temp:  99.1 F (37.3 C) 98.2 F (36.8 C)  98.6 F (37 C)  TempSrc:  Oral Oral  Oral  SpO2: 98% 98%  98%  Weight:  87.6 kg (193 lb 2 oz)    Height:        Intake/Output Summary (Last 24 hours) at 10/15/16 1726 Last data filed at 10/15/16 1637  Gross per 24 hour  Intake           3004.5 ml  Output             1725 ml  Net           1279.5 ml   Weight change: -3.4 kg (-7 lb 7.9 oz) Exam:   General:  Pt is alert, follows commands appropriately, not in acute distress  HEENT: No icterus, No thrush, No neck mass, [place]/AT  Cardiovascular: RRR, S1/S2, no rubs, no gallops  Respiratory: Bibasilar crackles. No wheezing. Good air movement.  Abdomen: Soft/+BS, non tender, non distended, no guarding  Extremities: 1 + LE edema, No lymphangitis, No petechiae, No rashes, no synovitis   Data Reviewed: I have personally reviewed following labs and imaging studies Basic Metabolic Panel:  Recent Labs Lab 10/13/16 0352 10/14/16 0611 10/15/16 0624  NA 135 137 137  K 3.5 3.5 3.3*  CL 108 106 103  CO2 16* 20* 21*  GLUCOSE 172* 170* 171*  BUN 39* 41* 47*  CREATININE 4.50* 4.51* 4.61*  CALCIUM 7.6* 7.8*  7.6* 7.6*  MG  --  1.6*  --   PHOS  --  5.9* 5.6*   Liver Function Tests:  Recent Labs Lab 10/13/16 0352 10/14/16 0611 10/15/16 0624  AST 13*  --   --   ALT 11*  --   --   ALKPHOS 68  --   --   BILITOT 0.4  --   --   PROT 6.9  --   --   ALBUMIN 3.3* 3.1* 3.1*   No results for input(s): LIPASE, AMYLASE in the last 168 hours. No results for input(s): AMMONIA in the last 168 hours. Coagulation Profile: No results for input(s): INR, PROTIME in the last 168 hours. CBC:  Recent Labs Lab 10/13/16 0352 10/13/16 0551 10/13/16 1951 10/14/16 0611 10/15/16 0624  WBC 13.8* 16.5*  --  16.7* 13.4*  NEUTROABS 11.5*  --   --   --   --   HGB 6.3* 6.9* 9.1* 9.3* 9.6*  HCT 19.2* 20.8* 26.7* 26.9* 28.2*  MCV 85.3 86.0  --  85.4 86.0  PLT 299 376  --  332 331   Cardiac Enzymes:  Recent  Labs Lab 10/13/16 0352 10/13/16 0551 10/13/16 0659 10/13/16 1156 10/13/16 1951  CKTOTAL  --  20*  --   --   --   TROPONINI 0.06*  --  0.06* 0.06* 0.05*   BNP: Invalid input(s): POCBNP CBG:  Recent Labs Lab 10/14/16 1645 10/14/16 2130 10/15/16 0740 10/15/16 1128 10/15/16 1640  GLUCAP 142* 178* 189* 160* 160*   HbA1C:  Recent Labs  10/13/16 0451 10/15/16 0624  HGBA1C 5.8* 5.7*   Urine analysis:    Component Value Date/Time   COLORURINE YELLOW 10/13/2016 0640   APPEARANCEUR HAZY (A) 10/13/2016 0640   LABSPEC 1.009 10/13/2016 0640   PHURINE 5.0 10/13/2016 0640   GLUCOSEU 50 (A) 10/13/2016 0640   HGBUR NEGATIVE 10/13/2016 [place] 10/13/2016 0640   KETONESUR NEGATIVE 10/13/2016 0640   PROTEINUR 100 (A) 10/13/2016 0640   UROBILINOGEN 0.2 04/25/2014 2315   NITRITE NEGATIVE 10/13/2016 0640   LEUKOCYTESUR LARGE (A) 10/13/2016 0640   Sepsis Labs: @  LABRCNTIP(procalcitonin:4,lacticidven:4) )No results found for this or any previous visit (from the past 240 hour(s)).   Scheduled Meds: . albuterol  2.5 mg Nebulization Q12H  . amLODipine  10 mg Oral Daily  . aspirin  81 mg Oral Daily  . atorvastatin  40 mg Oral q1800  . hydrALAZINE  50 mg Oral Q8H  . insulin aspart  0-9 Units Subcutaneous TID WC  . metoprolol succinate  50 mg Oral Daily  . nitroGLYCERIN  1 inch Topical Q8H  . pantoprazole  40 mg Oral Q0600  . potassium chloride  40 mEq Oral Daily  . sodium bicarbonate  650 mg Oral TID  . sodium chloride flush  3 mL Intravenous Q12H   Continuous Infusions: . sodium chloride    . sodium chloride 75 mL/hr at 10/15/16 0354  . furosemide 120 mg (10/15/16 1437)    Procedures/Studies: US Renal  Result Date: 10/13/2016 CLINICAL DATA:  Acute on chronic renal failure EXAM: RENAL / URINARY TRACT ULTRASOUND COMPLETE COMPARISON:  Renal ultrasound of January 17, 2013. FINDINGS: Right Kidney: Length: 11.4 cm. The renal cortical echotexture is increased  and is approximately equal to that of the liver. There is diffuse cortical thinning. There is no hydronephrosis. Left Kidney: Length: Bowel gas obscures the left kidney. Bladder: Appears normal for degree of bladder distention. IMPRESSION: Nonvisualization of the left kidney likely secondary to increased bowel gas on the left. Cortical thinning and increased renal cortical echotexture the right kidney is consistent with medical renal disease. No hydronephrosis is observed. Electronically Signed   By: Cass Vandermeulen  [place] M.D.   On: 10/13/2016 15:41   US Venous Img Lower Bilateral  Result Date: 10/13/2016 CLINICAL DATA:  Bilateral lower extremity pain and swelling, initial encounter EXAM: BILATERAL LOWER EXTREMITY VENOUS DOPPLER ULTRASOUND TECHNIQUE: Gray-scale sonography with graded compression, as well as color Doppler and duplex ultrasound were performed to evaluate the lower extremity deep venous systems from the level of the common femoral vein and including the common femoral, femoral, profunda femoral, popliteal and calf veins including the posterior tibial, peroneal and gastrocnemius veins when visible. The superficial great saphenous vein was also interrogated. Spectral Doppler was utilized to evaluate flow at rest and with distal augmentation maneuvers in the common femoral, femoral and popliteal veins. COMPARISON:  None. FINDINGS: RIGHT LOWER EXTREMITY Common Femoral Vein: No evidence of thrombus. Normal compressibility, respiratory phasicity and response to augmentation. Saphenofemoral Junction: No evidence of thrombus. Normal compressibility and flow on color Doppler imaging. Profunda Femoral Vein: No evidence of thrombus. Normal compressibility and flow on color Doppler imaging. Femoral Vein: No evidence of thrombus. Normal compressibility, respiratory phasicity and response to augmentation. Popliteal Vein: No evidence of thrombus. Normal compressibility, respiratory phasicity and response to augmentation.  Calf Veins: No evidence of thrombus. Normal compressibility and flow on color Doppler imaging. Superficial Great Saphenous Vein: No evidence of thrombus. Normal compressibility and flow on color Doppler imaging. Venous Reflux:  None. Other Findings:  Mild calf edema is noted. LEFT LOWER EXTREMITY Common Femoral Vein: No evidence of thrombus. Normal compressibility, respiratory phasicity and response to augmentation. Saphenofemoral Junction: No evidence of thrombus. Normal compressibility and flow on color Doppler imaging. Profunda Femoral Vein: No evidence of thrombus. Normal compressibility and flow on color Doppler imaging. Femoral Vein: No evidence of thrombus. Normal compressibility, respiratory phasicity and response to augmentation. Popliteal Vein: No evidence of thrombus. Normal compressibility, respiratory phasicity and response to augmentation. Calf Veins: No evidence of thrombus. Normal compressibility and flow on color Doppler imaging.  Superficial Great Saphenous Vein: No evidence of thrombus. Normal compressibility and flow on color Doppler imaging. Venous Reflux:  None. Other Findings:  Mild calf edema is noted. IMPRESSION: No evidence of DVT within either lower extremity. Electronically Signed   By: Inez Catalina M.D.   On: 10/13/2016 15:47   Dg Chest Port 1 View  Result Date: 10/13/2016 CLINICAL DATA:  Shortness of breath and chest tightness EXAM: PORTABLE CHEST 1 VIEW COMPARISON:  04/26/2014 FINDINGS: Moderate cardiomegaly with globular cardiac configuration, increased compared to prior radiograph. Central vascular congestion and mild interstitial perihilar edema. No pleural effusion. Aortic atherosclerosis. No pneumothorax. Electronic device over the left chest. IMPRESSION: Moderate cardiomegaly with globular cardiac configuration, findings could be secondary to multi chamber enlargement or pericardial effusion. There is central vascular congestion and mild perihilar edema. Electronically Signed    By: Donavan Foil M.D.   On: 10/13/2016 03:25    Warner Laduca, DO  [facility] Pager [phone]  If 7PM-7AM, please contact night-coverage [email or web address] Password [email or web address] 10/15/2016, 5:26 PM   LOS: 2 days

## 2016-10-16 ENCOUNTER — Inpatient Hospital Stay (HOSPITAL_COMMUNITY): Payer: 59

## 2016-10-16 DIAGNOSIS — D649 Anemia, unspecified: Secondary | ICD-10-CM

## 2016-10-16 DIAGNOSIS — N185 Chronic kidney disease, stage 5: Secondary | ICD-10-CM

## 2016-10-16 DIAGNOSIS — N170 Acute kidney failure with tubular necrosis: Principal | ICD-10-CM

## 2016-10-16 DIAGNOSIS — N189 Chronic kidney disease, unspecified: Secondary | ICD-10-CM

## 2016-10-16 LAB — RENAL FUNCTION PANEL
ANION GAP: 11 (ref 5–15)
Albumin: 3 g/dL — ABNORMAL LOW (ref 3.5–5.0)
BUN: 50 mg/dL — AB (ref 6–20)
CO2: 23 mmol/L (ref 22–32)
Calcium: 7.4 mg/dL — ABNORMAL LOW (ref 8.9–10.3)
Chloride: 101 mmol/L (ref 101–111)
Creatinine, Ser: 4.84 mg/dL — ABNORMAL HIGH (ref 0.44–1.00)
GFR calc Af Amer: 10 mL/min — ABNORMAL LOW (ref >60)
GFR calc non Af Amer: 9 mL/min — ABNORMAL LOW (ref >60)
GLUCOSE: 255 mg/dL — AB (ref 65–99)
POTASSIUM: 3.5 mmol/L (ref 3.5–5.1)
Phosphorus: 4.7 mg/dL — ABNORMAL HIGH (ref 2.5–4.6)
SODIUM: 135 mmol/L (ref 135–145)

## 2016-10-16 LAB — GLUCOSE, CAPILLARY
GLUCOSE-CAPILLARY: 163 mg/dL — AB (ref 65–99)
GLUCOSE-CAPILLARY: 197 mg/dL — AB (ref 65–99)
Glucose-Capillary: 207 mg/dL — ABNORMAL HIGH (ref 65–99)
Glucose-Capillary: 333 mg/dL — ABNORMAL HIGH (ref 65–99)

## 2016-10-16 LAB — MAGNESIUM: Magnesium: 1.5 mg/dL — ABNORMAL LOW (ref 1.7–2.4)

## 2016-10-16 MED ORDER — SODIUM CHLORIDE 0.9% FLUSH
10.0000 mL | Freq: Two times a day (BID) | INTRAVENOUS | Status: DC
Start: 2016-10-16 — End: 2016-10-20
  Administered 2016-10-16 – 2016-10-20 (×7): 10 mL

## 2016-10-16 MED ORDER — CALCITRIOL 0.25 MCG PO CAPS
0.2500 ug | ORAL_CAPSULE | Freq: Every day | ORAL | Status: DC
  Administered 2016-10-16 – 2016-10-20 (×5): 0.25 ug via ORAL
  Filled 2016-10-16 (×5): qty 1

## 2016-10-16 MED ORDER — HYDROCODONE-ACETAMINOPHEN 5-325 MG PO TABS
1.0000 | ORAL_TABLET | ORAL | Status: DC | PRN
  Administered 2016-10-16 – 2016-10-19 (×6): 1 via ORAL
  Filled 2016-10-16 (×6): qty 1

## 2016-10-16 MED ORDER — METOPROLOL TARTRATE 5 MG/5ML IV SOLN
5.0000 mg | INTRAVENOUS | Status: DC | PRN
  Administered 2016-10-17 – 2016-10-19 (×6): 5 mg via INTRAVENOUS
  Filled 2016-10-16 (×6): qty 5

## 2016-10-16 MED ORDER — SODIUM CHLORIDE 0.9% FLUSH: 10.0000 mL | INTRAVENOUS | Status: DC | PRN

## 2016-10-16 MED ORDER — MAGNESIUM SULFATE 2 GM/50ML IV SOLN
2.0000 g | Freq: Once | INTRAVENOUS | Status: AC
  Administered 2016-10-16: 2 g via INTRAVENOUS
  Filled 2016-10-16: qty 50

## 2016-10-16 NOTE — Procedures (Signed)
Kelli [name] is a 66 y.o. female patient. 1. Chronic renal failure, unspecified CKD stage   2. Anemia, unspecified type   3. Chest pain, unspecified type   4. Elevated troponin   5. Acute on chronic renal failure (HCC)   6. Leg pain   7. Central line clotted, initial encounter [facility])    Past Medical History:  Diagnosis Date  . Borderline diabetes   . Carotid artery occlusion    Occluded RICA, status post left CEA  August 2014 - Dr. Donnetta Hutching  . Cerebral infarction [facility]) Aug 2014   Bihemispheric watershed infarcts  . Cerebral infarction involving left cerebellar artery [facility]) Feb 2015  . CKD (chronic kidney disease) stage 3, GFR 30-59 ml/min   . Closed dislocation of left humerus 07/26/2013  . Essential hypertension, benign   . Fibromyalgia   . Mixed hyperlipidemia   . Multiple gastric ulcers   . Urinary incontinence    Blood pressure (!) 154/87, pulse 99, temperature 98.4 F (36.9 C), temperature source Oral, resp. rate 20, height 5\' 5"  (1.651 m), weight 194 lb 10.7 oz (88.3 kg), SpO2 99 %.  Procedures Patient was placed in Trendelenburg position after informed consent was obtained. A timeout was performed. The left subclavian region was prepped and draped using usual sterile technique with ChloraPrep. A needle was advanced into the left subclavian vein using the Seldinger technique without difficulty. The guidewire then advanced into the left subclavian vein. An introducer was then placed over the guidewire. A triple-lumen cath was then inserted over the guidewire and guidewire was removed. Good blood flow was noted on aspiration of all 3 ports. All 3 ports were flushed with heparin flush. It was secured in place using a 3-0 silk suture. A dry sterile dressing was applied. The patient tolerated the procedure well. A chest x-ray is pending. [facility] Signs 10/16/2016

## 2016-10-16 NOTE — Progress Notes (Signed)
PROGRESS NOTE  Kelli [name] FGH:[id number] DOB: 03/15/1950 DOA: 10/13/2016 PCP: Manon Hilding, MD  Brief History: 66 year old female with a history of stroke with residual left hemiparesis, CKD stage III, essential hypertension, diabetes mellitus, peripheral vascular disease presented when she woke up with substernal chest discomfort in the early morning of 10/13/2016. The patient has some associated shortness of breath and diaphoresis. She denies any fevers, chills, headache, neck pain, dysuria, abdominal pain, hematochezia, melena. The patient states that she often feels nauseous when she first wakes up in the morning resulting in an episode of emesis. This has happened for many years. Upon presentation, the patient was noted to have hemoglobin of 6.3 and serum creatinine of 4.50. Chest x-ray showed central vascular congestion and interstitial edema. The patient was started on intravenous furosemide and admitted for further workup of her renal failure. Nephrology and cardiology were consulted to assist.  Assessment/Plan: Acute on chronic diastolic CHF -daily weights--NEG 7 lbs for the admission -accurate I/O's--not accurate -fluid restrict -10/14/16-Echo--EF 55-60%, grade 2 DD, no WMA. -continue lasix and fluids per nephrology -remains clinically fluid overloaded -cannot use ACEi due to renal failure  Acute respiratory failure with hypoxia -due to CHF -stable on 2.5 L -wean oxygen for saturation >92%  Atypical chest pain/Elevated troponin -appreciatecardiology -troponin trend flat -pt also has loop recorder placed Feb 2015 after stroke -elevated troponin due to decompensated CHF  Acute on chronic renal failure--CKD stage 3 -baseline creatinine 1.2-1.5 but no recent BMP -last serum creatinine 1.49 on 04/29/2014 -likely has progression of her underlying CKD with acute worsening possibly due to hemodynamic factors from CHF. -Appreciate nephrology follow-up -I  Contacted the patient's PCP--no blood work since 2015 in office record -renal US--nonvisualization of the left kidney secondary to bowel gas.Right renal echogenicity without hydronephrosis. 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 Talbert Cage  Anemia of CKD--symptomatic anemia -check anemia panel -transfused 2 units PRBCs for the admssion -FOBT neg -10/13/16--Iron saturation 11%, ferritin 67, folic acid 8.7  Essential HTN-uncontrolled -continue metoprolol succinate -cannot use ACEi due to renal failure -continue amlodipine and hydralazine -increased hydralazine dose 50 mg q 8 hours  Hx of Stroke with left hemiparesis  -continue ASA -check lipid panel--LDL57 -pt has loop recorder placed Feb 2015 -continue statin  Diabetes mellitus -d/c metformin altogether due to renal failure -check A1C--5.7 -CBGs controlled  Hyperlipidemia -continue statin  Leg edema and pain -venous duplex--neg  Hypokalemia -replete -check mag  Difficult IV access -consulted Dr. Arnoldo Morale for central line--placed 10/16/16    Disposition Plan: Home in 2-3 days if cleared by renal/cardiology Family Communication: spouseupdatedat bedside 8/17   Consultants: cardiology, nephrology  Code Status: FULL   DVT Prophylaxis: [place] Heparin   Procedures: As Listed in Progress Note Above  Antibiotics: None  Subjective: Patient states that she is breathing better but still has some shortness of breath. She denies any fevers, chills, chest Pain, nausea, vomiting, diarrhea, abdominal pain, dysuria, hematuria.  Objective: Vitals:   10/16/16 0507 10/16/16 0808 10/16/16 1345 10/16/16 1408  BP: (!) 154/87  (!) 210/74 (!) 175/80  Pulse: 99  97 94  Resp: 20  20   Temp: 98.4 F (36.9  C)  98.2 F (36.8 C)   TempSrc: Oral  Oral   SpO2: 95% 99% 97%   Weight: 88.3  kg (194 lb 10.7 oz)     Height:        Intake/Output Summary (Last 24 hours) at 10/16/16 1700 Last data filed at 10/16/16 1200  Gross per 24 hour  Intake              543 ml  Output             1750 ml  Net            -1207 ml   Weight change: 0.7 kg (1 lb 8.7 oz) Exam:   General:  Pt is alert, follows commands appropriately, not in acute distress  HEENT: No icterus, No thrush, No neck mass, Seth Ward/AT  Cardiovascular: RRR, S1/S2, no rubs, no gallops  Respiratory: Bibasilar crackles. No wheezing. Good air movement  Abdomen: Soft/+BS, non tender, non distended, no guarding  Extremities: 1 + LE edema, No lymphangitis, No petechiae, No rashes, no synovitis   Data Reviewed: I have personally reviewed following labs and imaging studies Basic Metabolic Panel:  Recent Labs Lab 10/13/16 0352 10/14/16 0611 10/15/16 0624 10/16/16 1341  NA 135 137 137 135  K 3.5 3.5 3.3* 3.5  CL 108 106 103 101  CO2 16* 20* 21* 23  GLUCOSE 172* 170* 171* 255*  BUN 39* 41* 47* 50*  CREATININE 4.50* 4.51* 4.61* 4.84*  CALCIUM 7.6* 7.8*  7.6* 7.6* 7.4*  MG  --  1.6*  --  1.5*  PHOS  --  5.9* 5.6* 4.7*   Liver Function Tests:  Recent Labs Lab 10/13/16 0352 10/14/16 0611 10/15/16 0624 10/16/16 1341  AST 13*  --   --   --   ALT 11*  --   --   --   ALKPHOS 68  --   --   --   BILITOT 0.4  --   --   --   PROT 6.9  --   --   --   ALBUMIN 3.3* 3.1* 3.1* 3.0*   No results for input(s): LIPASE, AMYLASE in the last 168 hours. No results for input(s): AMMONIA in the last 168 hours. Coagulation Profile: No results for input(s): INR, PROTIME in the last 168 hours. CBC:  Recent Labs Lab 10/13/16 0352 10/13/16 0551 10/13/16 1951 10/14/16 0611 10/15/16 0624  WBC 13.8* 16.5*  --  16.7* 13.4*  NEUTROABS 11.5*  --   --   --   --   HGB 6.3* 6.9* 9.1* 9.3* 9.6*  HCT 19.2* 20.8* 26.7* 26.9* 28.2*  MCV 85.3 86.0   --  85.4 86.0  PLT 299 376  --  332 331   Cardiac Enzymes:  Recent Labs Lab 10/13/16 0352 10/13/16 0551 10/13/16 0659 10/13/16 1156 10/13/16 1951  CKTOTAL  --  20*  --   --   --   TROPONINI 0.06*  --  0.06* 0.06* 0.05*   BNP: Invalid input(s): POCBNP CBG:  Recent Labs Lab 10/15/16 1640 10/15/16 2123 10/16/16 0721 10/16/16 1106 10/16/16 1621  GLUCAP 160* 184* 207* 333* 163*   HbA1C:  Recent Labs  10/15/16 0624  HGBA1C 5.7*   Urine analysis:    Component Value Date/Time   COLORURINE YELLOW 10/13/2016 0640   APPEARANCEUR HAZY (A) 10/13/2016 0640   LABSPEC 1.009 10/13/2016 0640   PHURINE 5.0 10/13/2016 0640   GLUCOSEU 50 (A) 10/13/2016 0640   HGBUR NEGATIVE 10/13/2016 [place] 10/13/2016 0640   KETONESUR NEGATIVE 10/13/2016 0640   PROTEINUR 100 (A) 10/13/2016 0640   UROBILINOGEN 0.2 04/25/2014  2315   NITRITE NEGATIVE 10/13/2016 0640   LEUKOCYTESUR LARGE (A) 10/13/2016 0640   Sepsis Labs: @LABRCNTIP (procalcitonin:4,lacticidven:4) )No results found for this or any previous visit (from the past 240 hour(s)).   Scheduled Meds: . albuterol  2.5 mg Nebulization Q12H  . amLODipine  10 mg Oral Daily  . aspirin  81 mg Oral Daily  . atorvastatin  40 mg Oral q1800  . calcitRIOL  0.25 mcg Oral Daily  . hydrALAZINE  50 mg Oral Q8H  . insulin aspart  0-9 Units Subcutaneous TID WC  . metoprolol succinate  50 mg Oral Daily  . nitroGLYCERIN  1 inch Topical Q8H  . pantoprazole  40 mg Oral Q0600  . potassium chloride  40 mEq Oral Daily  . sodium bicarbonate  650 mg Oral TID  . sodium chloride flush  10-40 mL Intracatheter Q12H  . sodium chloride flush  3 mL Intravenous Q12H   Continuous Infusions: . sodium chloride    . sodium chloride 75 mL/hr at 10/16/16 0637  . furosemide Stopped (10/16/16 1515)  . magnesium sulfate 1 - 4 g bolus IVPB      Procedures/Studies: US Renal  Result Date: 10/13/2016 CLINICAL DATA:  Acute on chronic renal  failure EXAM: RENAL / URINARY TRACT ULTRASOUND COMPLETE COMPARISON:  Renal ultrasound of January 17, 2013. FINDINGS: Right Kidney: Length: 11.4 cm. The renal cortical echotexture is increased and is approximately equal to that of the liver. There is diffuse cortical thinning. There is no hydronephrosis. Left Kidney: Length: Bowel gas obscures the left kidney. Bladder: Appears normal for degree of bladder distention. IMPRESSION: Nonvisualization of the left kidney likely secondary to increased bowel gas on the left. Cortical thinning and increased renal cortical echotexture the right kidney is consistent with medical renal disease. No hydronephrosis is observed. Electronically Signed   By: Ava Tangney  [place] M.D.   On: 10/13/2016 15:41   US Venous Img Lower Bilateral  Result Date: 10/13/2016 CLINICAL DATA:  Bilateral lower extremity pain and swelling, initial encounter EXAM: BILATERAL LOWER EXTREMITY VENOUS DOPPLER ULTRASOUND TECHNIQUE: Gray-scale sonography with graded compression, as well as color Doppler and duplex ultrasound were performed to evaluate the lower extremity deep venous systems from the level of the common femoral vein and including the common femoral, femoral, profunda femoral, popliteal and calf veins including the posterior tibial, peroneal and gastrocnemius veins when visible. The superficial great saphenous vein was also interrogated. Spectral Doppler was utilized to evaluate flow at rest and with distal augmentation maneuvers in the common femoral, femoral and popliteal veins. COMPARISON:  None. FINDINGS: RIGHT LOWER EXTREMITY Common Femoral Vein: No evidence of thrombus. Normal compressibility, respiratory phasicity and response to augmentation. Saphenofemoral Junction: No evidence of thrombus. Normal compressibility and flow on color Doppler imaging. Profunda Femoral Vein: No evidence of thrombus. Normal compressibility and flow on color Doppler imaging. Femoral Vein: No evidence of thrombus.  Normal compressibility, respiratory phasicity and response to augmentation. Popliteal Vein: No evidence of thrombus. Normal compressibility, respiratory phasicity and response to augmentation. Calf Veins: No evidence of thrombus. Normal compressibility and flow on color Doppler imaging. Superficial Great Saphenous Vein: No evidence of thrombus. Normal compressibility and flow on color Doppler imaging. Venous Reflux:  None. Other Findings:  Mild calf edema is noted. LEFT LOWER EXTREMITY Common Femoral Vein: No evidence of thrombus. Normal compressibility, respiratory phasicity and response to augmentation. Saphenofemoral Junction: No evidence of thrombus. Normal compressibility and flow on color Doppler imaging. Profunda Femoral Vein: No evidence of thrombus. Normal compressibility and  flow on color Doppler imaging. Femoral Vein: No evidence of thrombus. Normal compressibility, respiratory phasicity and response to augmentation. Popliteal Vein: No evidence of thrombus. Normal compressibility, respiratory phasicity and response to augmentation. Calf Veins: No evidence of thrombus. Normal compressibility and flow on color Doppler imaging. Superficial Great Saphenous Vein: No evidence of thrombus. Normal compressibility and flow on color Doppler imaging. Venous Reflux:  None. Other Findings:  Mild calf edema is noted. IMPRESSION: No evidence of DVT within either lower extremity. Electronically Signed   By: Inez Catalina M.D.   On: 10/13/2016 15:47   Dg Chest Port 1 View  Result Date: 10/16/2016 CLINICAL DATA:  Central line placement. EXAM: PORTABLE CHEST 1 VIEW COMPARISON:  October 13, 2016 FINDINGS: A new left subclavian line is identified terminating in the SVC. No pneumothorax. Mild pulmonary venous congestion. Mild opacity in the right base is likely atelectasis. Cardiomegaly. No other abnormalities or changes. IMPRESSION: Cardiomegaly.  Pulmonary venous congestion. New left central line terminating in the SVC.   No pneumothorax. Electronically Signed   By: Dorise Bullion III M.D   On: 10/16/2016 13:11   Dg Chest Port 1 View  Result Date: 10/13/2016 CLINICAL DATA:  Shortness of breath and chest tightness EXAM: PORTABLE CHEST 1 VIEW COMPARISON:  04/26/2014 FINDINGS: Moderate cardiomegaly with globular cardiac configuration, increased compared to prior radiograph. Central vascular congestion and mild interstitial perihilar edema. No pleural effusion. Aortic atherosclerosis. No pneumothorax. Electronic device over the left chest. IMPRESSION: Moderate cardiomegaly with globular cardiac configuration, findings could be secondary to multi chamber enlargement or pericardial effusion. There is central vascular congestion and mild perihilar edema. Electronically Signed   By: Donavan Foil M.D.   On: 10/13/2016 03:25    Vici Novick, DO  [facility] Pager [phone]  If 7PM-7AM, please contact night-coverage [email or web address] Password [email or web address] 10/16/2016, 5:00 PM   LOS: 3 days

## 2016-10-16 NOTE — Progress Notes (Signed)
Subjective: Interval History: Patient denies any difficulty breathing. She denies any nausea or vomiting.   Vital signs in last 24 hours: Temp:  [98.4 F (36.9 C)-98.8 F (37.1 C)] 98.4 F (36.9 C) (08/18 0507) Pulse Rate:  [93-111] 99 (08/18 0507) Resp:  [18-20] 20 (08/18 0507) BP: (136-159)/(76-95) 154/87 (08/18 0507) SpO2:  [95 %-99 %] 99 % (08/18 0808) Weight:  [88.3 kg (194 lb 10.7 oz)] 88.3 kg (194 lb 10.7 oz) (08/18 0507) Weight change: 0.7 kg (1 lb 8.7 oz)  Intake/Output from previous day: 08/17 0701 - 08/18 0700 In: 1568.3 [P.O.:120; I.V.:1324.3; IV Piggyback:124] Out: 800 [Urine:800] Intake/Output this shift: No intake/output data recorded.  Generally: Patient is more alert today and answering questions appropriately. chest: Decreased breath sound otherwise seems to be clear Heart exam regular rate and rhythm no murmur Extremities: She has trace edema  Lab Results:  Recent Labs  10/14/16 0611 10/15/16 0624  WBC 16.7* 13.4*  HGB 9.3* 9.6*  HCT 26.9* 28.2*  PLT 332 331   BMET:   Recent Labs  10/14/16 0611 10/15/16 0624  NA 137 137  K 3.5 3.3*  CL 106 103  CO2 20* 21*  GLUCOSE 170* 171*  BUN 41* 47*  CREATININE 4.51* 4.61*  CALCIUM 7.8*  7.6* 7.6*    Recent Labs  10/14/16 0611  PTH 162*  Comment   Iron Studies:   Recent Labs  10/15/16 0624  IRON 39  TIBC 272  FERRITIN 155    Studies/Results: No results found.  I have reviewed the patient's current medications.  Assessment/Plan: Problem #1 renal failure: Acute on chronic versus natural progression of her disease. At this moment superimposed prerenal syndrome versus ATN . Her blood work is pending at this moment have difficulty in getting blood according to her husband. Patient however clinically seems to be feeling better. Problem #2 chronic renal failure: Stage III. Etiology could be ischemic/diabetes/hypertension/recurrent acute kidney injury/patient also with recurrent ureteral  tract infection treated with different antibiotics. Patient was seen by urology at one time. Ultrasound of the kidney was not able to visualize left kidney. However ultrasound from 2014 showed left kidney to be 8.7 cm with the right one about 10.4. Problem #3 anemia: Patient was previous history of GI bleeding and anemia. At this moment possibly a combination of iron deficiency anemia and anemia move chronic disease.  Problem #4 bone mineral disorder: Hypercalcemia in range but phosphorus is high 5.9. Her PTH is 162 which is high.  Problem #5 history of CVA: According to her chart she has 3 episodes. Problem #6 history of cardiac erythema: She has previous history of loop recorder implant. Problem #7 nausea and poor appetite etiology not clear. patient is feeling much better.  Problem #8 difficulty breathing: she is on Lasix and feeling better.  Plan: We'll continue his present management 2] to continue with Lasix 3] we'll check her renal panel in am  4] we'll start her on Rocaltrol 0.25 g by mouth daily    LOS: 3 days   Oluwadamilola Deliz S 10/16/2016,9:16 AM

## 2016-10-17 DIAGNOSIS — N178 Other acute kidney failure: Secondary | ICD-10-CM

## 2016-10-17 LAB — TYPE AND SCREEN
ABO/RH(D): A NEG
Antibody Screen: NEGATIVE
UNIT DIVISION: 0
UNIT DIVISION: 0
Unit division: 0

## 2016-10-17 LAB — RENAL FUNCTION PANEL
ANION GAP: 13 (ref 5–15)
Albumin: 3.3 g/dL — ABNORMAL LOW (ref 3.5–5.0)
BUN: 51 mg/dL — AB (ref 6–20)
CALCIUM: 8 mg/dL — AB (ref 8.9–10.3)
CO2: 24 mmol/L (ref 22–32)
Chloride: 101 mmol/L (ref 101–111)
Creatinine, Ser: 4.62 mg/dL — ABNORMAL HIGH (ref 0.44–1.00)
GFR calc Af Amer: 10 mL/min — ABNORMAL LOW (ref >60)
GFR calc non Af Amer: 9 mL/min — ABNORMAL LOW (ref >60)
GLUCOSE: 207 mg/dL — AB (ref 65–99)
Phosphorus: 5.4 mg/dL — ABNORMAL HIGH (ref 2.5–4.6)
Potassium: 3.6 mmol/L (ref 3.5–5.1)
SODIUM: 138 mmol/L (ref 135–145)

## 2016-10-17 LAB — BPAM RBC
BLOOD PRODUCT EXPIRATION DATE: 201808252359
Blood Product Expiration Date: 201808252359
Blood Product Expiration Date: 201808302359
ISSUE DATE / TIME: 201808150900
ISSUE DATE / TIME: 201808151137
UNIT TYPE AND RH: 600
Unit Type and Rh: 600
Unit Type and Rh: 9500

## 2016-10-17 LAB — GLUCOSE, CAPILLARY
Glucose-Capillary: 198 mg/dL — ABNORMAL HIGH (ref 65–99)
Glucose-Capillary: 204 mg/dL — ABNORMAL HIGH (ref 65–99)
Glucose-Capillary: 166 mg/dL — ABNORMAL HIGH (ref 65–99)
Glucose-Capillary: 152 mg/dL — ABNORMAL HIGH (ref 65–99)

## 2016-10-17 LAB — MAGNESIUM: Magnesium: 2.1 mg/dL (ref 1.7–2.4)

## 2016-10-17 MED ORDER — TORSEMIDE 20 MG PO TABS
60.0000 mg | ORAL_TABLET | Freq: Every day | ORAL | Status: DC
  Administered 2016-10-17 – 2016-10-20 (×4): 60 mg via ORAL
  Filled 2016-10-17 (×4): qty 3

## 2016-10-17 MED ORDER — METOPROLOL SUCCINATE ER 50 MG PO TB24
100.0000 mg | ORAL_TABLET | Freq: Every day | ORAL | Status: DC
  Administered 2016-10-18 – 2016-10-20 (×3): 100 mg via ORAL
  Filled 2016-10-17 (×3): qty 2

## 2016-10-17 NOTE — Progress Notes (Signed)
Subjective: Interval History: According to her husband patient is eating better. She doesn't have any nausea or vomiting. At this moment denies any difficulty breathing. He states that she has improved since she came.   Vital signs in last 24 hours: Temp:  [97.9 F (36.6 C)-98.6 F (37 C)] 97.9 F (36.6 C) (08/19 0557) Pulse Rate:  [87-97] 95 (08/19 0557) Resp:  [18-20] 18 (08/19 0557) BP: (129-210)/(71-82) 155/82 (08/19 0557) SpO2:  [95 %-100 %] 98 % (08/19 0810) Weight:  [85.9 kg (189 lb 6 oz)] 85.9 kg (189 lb 6 oz) (08/19 0557) Weight change: -2.4 kg (-5 lb 4.7 oz)  Intake/Output from previous day: 08/18 0701 - 08/19 0700 In: 2454 [P.O.:480; I.V.:1800; IV Piggyback:174] Out: 2550 [Urine:2550] Intake/Output this shift: No intake/output data recorded.  Generally: Patient is more alert today and answering questions appropriately. chest: Decreased breath sound otherwise seems to be clear Heart exam regular rate and rhythm no murmur Extremities: She has trace edema  Lab Results:  Recent Labs  10/15/16 0624  WBC 13.4*  HGB 9.6*  HCT 28.2*  PLT 331   BMET:   Recent Labs  10/15/16 0624 10/16/16 1341  NA 137 135  K 3.3* 3.5  CL 103 101  CO2 21* 23  GLUCOSE 171* 255*  BUN 47* 50*  CREATININE 4.61* 4.84*  CALCIUM 7.6* 7.4*   No results for input(s): PTH in the last 72 hours. Iron Studies:   Recent Labs  10/15/16 0624  IRON 39  TIBC 272  FERRITIN 155    Studies/Results: Dg Chest Port 1 View  Result Date: 10/16/2016 CLINICAL DATA:  Central line placement. EXAM: PORTABLE CHEST 1 VIEW COMPARISON:  October 13, 2016 FINDINGS: A new left subclavian line is identified terminating in the SVC. No pneumothorax. Mild pulmonary venous congestion. Mild opacity in the right base is likely atelectasis. Cardiomegaly. No other abnormalities or changes. IMPRESSION: Cardiomegaly.  Pulmonary venous congestion. New left central line terminating in the SVC.  No pneumothorax.  Electronically Signed   By: Dorise Bullion III M.D   On: 10/16/2016 13:11    I have reviewed the patient's current medications.  Assessment/Plan: Problem #1 renal failure: Acute on chronic versus natural progression of her disease. At this moment superimposed prerenal syndrome versus ATN . Her creatinine is 4.8 from yesterday. Still unable to get blood this morning's ATTEMPT again. Her renal function is still his note stabilizing. Patient however was good urine output. She has 2500 mL of urine into the last 24 hours. Problem #2 chronic renal failure: Stage III. Etiology could be ischemic/diabetes/hypertension/recurrent acute kidney injury/patient also with recurrent ureteral tract infection treated with different antibiotics. Patient was seen by urology at one time. Ultrasound of the kidney was not able to visualize left kidney. However ultrasound from 2014 showed left kidney to be 8.7 cm with the right one about 10.4. Problem #3 anemia: Her hemoglobin is below target goal but stable.  Problem #4 bone mineral disorder: Hypercalcemia in range but phosphorus is high 5.9. Her PTH is 162 which is high. Patient is started on Rocaltrol. Problem #5 history of CVA: According to her chart she has 3 episodes. Problem #6 history of cardiac erythema: She has previous history of loop recorder implant. Problem #7 nausea and poor appetite has improved according to her husband. No nausea or vomiting. Problem #8 difficulty breathing: she is on Lasix and feeling better. As stated above patient was 2500 mL of urine output no sign of fluid overload Plan: We'll continue  with hydration 2] will DC Lasix and start her on Demadex 60 mg by mouth daily 3] we'll check her renal panel in am  4] patient at this moment remains stable and doesn't seem to have uremic sinus symptoms and doesn't require dialysis. I've discussed with her husband and seems to be agreeable.    LOS: 4 days   Gery Sabedra S 10/17/2016,8:30  AM

## 2016-10-17 NOTE — Progress Notes (Signed)
PROGRESS NOTE  Kelli [name]:[id number] DOB: Apr 12, 1950 DOA: 10/13/2016 PCP: Manon Hilding, MD   Brief History: 66 year old female with a history of stroke with residual left hemiparesis, CKD stage III, essential hypertension, diabetes mellitus, peripheral vascular disease presented when she woke up with substernal chest discomfort in the early morning of 10/13/2016. The patient has some associated shortness of breath and diaphoresis. She denies any fevers, chills, headache, neck pain, dysuria, abdominal pain, hematochezia, melena. The patient states that she often feels nauseous when she first wakes up in the morning resulting in an episode of emesis. This has happened for many years. Upon presentation, the patient was noted to have hemoglobin of 6.3 and serum creatinine of 4.50. Chest x-ray showed central vascular congestion and interstitial edema. The patient was started on intravenous furosemide and admitted for further workup of her renal failure. Nephrology and cardiology were consulted to assist.  Assessment/Plan: Acute on chronic diastolic CHF -daily weights--NEG 11 lbs for the admission -accurate I/O's--not accurate -fluid restrict -10/14/16-Echo--EF 55-60%, grade 2 DD, no WMA. -torsemide and fluids per nephrology -cannot use ACEi due to renal failure  Acute respiratory failure with hypoxia -due to CHF -stable on 2.5 L -wean oxygen for saturation >92% -8/18 CXR--personally reviewed--pulmonary venous congestion  Atypical chest pain/Elevated troponin -appreciatecardiology -troponin trend flat -pt also has loop recorder placed Feb 2015 after stroke -elevated troponin due to decompensated CHF  Acute on chronic renal failure--CKD stage 3 -baseline creatinine 1.2-1.5 but no recent BMP -last serum creatinine 1.49 on 04/29/2014 -likely has progression of her underlying CKD with acute worsening possibly due to hemodynamic factors from CHF. -Appreciate nephrology  follow-up -I Contacted the patient's PCP--no blood work since 2015 in office record -renal US--nonvisualization of the left kidney secondary to bowel gas.Right renal echogenicity without hydronephrosis. 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 Talbert Cage  Anemia of CKD--symptomatic anemia -check anemia panel -transfused 2units PRBCs for the admssion -FOBT neg -10/13/16--Iron saturation 11%, ferritin 67, folic acid 8.7  Essential HTN-uncontrolled -continue metoprolol succinate -cannot use ACEi due to renal failure -continue amlodipine and hydralazine -increased hydralazine dose 50 mg q 8 hours -increase metoprolol succinate to 100 mg daily  Hx of Stroke with left hemiparesis  -continue ASA -check lipid panel--LDL57 -pt has loop recorder placed Feb 2015 -continue statin -PT eval  Diabetes mellitus -d/c metformin altogether due to renal failure -8/17check A1C--5.7 -CBGs increased as pt eating more -change to moderate scale SSI  Hyperlipidemia -continue statin  Leg edema and pain -venous duplex--neg  Hypokalemia -replete -check mag--2.1  Difficult IV access -consulted Dr. Arnoldo Morale for central line--placed 10/16/16    Disposition Plan: Home 8/20 or 8/21 if cleared by renal Family Communication: spouseupdatedat bedside 8/19   Consultants: cardiology, nephrology  Code Status: FULL   DVT Prophylaxis: [place] Heparin   Procedures: As Listed in Progress Note Above  Antibiotics: None    Subjective: Patient denies fevers, chills, headache, chest pain, dyspnea, nausea, vomiting, diarrhea, abdominal pain, dysuria, hematuria, hematochezia, and melena.   Objective: Vitals:   10/16/16 2124 10/17/16 0557 10/17/16 0810 10/17/16 1300  BP:  129/75 (!) 155/82  (!) 180/90  Pulse: 93 95  93  Resp: 18 18  19   Temp: 98.6 F (37 C) 97.9 F (36.6 C)  98.4 F (36.9 C)  TempSrc:  Oral Oral  Oral  SpO2: 100% 95% 98% 93%  Weight:  85.9 kg (189 lb 6 oz)    Height:        Intake/Output Summary (Last 24 hours) at 10/17/16 1654 Last data filed at 10/17/16 1300  Gross per 24 hour  Intake             3050 ml  Output             1600 ml  Net             1450 ml   Weight change: -2.4 kg (-5 lb 4.7 oz) Exam:   General:  Pt is alert, follows commands appropriately, not in acute distress  HEENT: No icterus, No thrush, No neck mass, [place]/AT  Cardiovascular: RRR, S1/S2, no rubs, no gallops  Respiratory: CTA bilaterally, no wheezing, no crackles, no rhonchi  Abdomen: Soft/+BS, non tender, non distended, no guarding  Extremities: No edema, No lymphangitis, No petechiae, No rashes, no synovitis   Data Reviewed: I have personally reviewed following labs and imaging studies Basic Metabolic Panel:  Recent Labs Lab 10/13/16 0352 10/14/16 0611 10/15/16 0624 10/16/16 1341 10/17/16 0830  NA 135 137 137 135 138  K 3.5 3.5 3.3* 3.5 3.6  CL 108 106 103 101 101  CO2 16* 20* 21* 23 24  GLUCOSE 172* 170* 171* 255* 207*  BUN 39* 41* 47* 50* 51*  CREATININE 4.50* 4.51* 4.61* 4.84* 4.62*  CALCIUM 7.6* 7.8*  7.6* 7.6* 7.4* 8.0*  MG  --  1.6*  --  1.5* 2.1  PHOS  --  5.9* 5.6* 4.7* 5.4*   Liver Function Tests:  Recent Labs Lab 10/13/16 0352 10/14/16 0611 10/15/16 0624 10/16/16 1341 10/17/16 0830  AST 13*  --   --   --   --   ALT 11*  --   --   --   --   ALKPHOS 68  --   --   --   --   BILITOT 0.4  --   --   --   --   PROT 6.9  --   --   --   --   ALBUMIN 3.3* 3.1* 3.1* 3.0* 3.3*   No results for input(s): LIPASE, AMYLASE in the last 168 hours. No results for input(s): AMMONIA in the last 168 hours. Coagulation Profile: No results for input(s): INR, PROTIME in the last 168 hours. CBC:  Recent Labs Lab 10/13/16 0352  10/13/16 0551 10/13/16 1951 10/14/16 0611 10/15/16 0624  WBC 13.8* 16.5*  --  16.7* 13.4*  NEUTROABS 11.5*  --   --   --   --   HGB 6.3* 6.9* 9.1* 9.3* 9.6*  HCT 19.2* 20.8* 26.7* 26.9* 28.2*  MCV 85.3 86.0  --  85.4 86.0  PLT 299 376  --  332 331   Cardiac Enzymes:  Recent Labs Lab 10/13/16 0352 10/13/16 0551 10/13/16 0659 10/13/16 1156 10/13/16 1951  CKTOTAL  --  20*  --   --   --   TROPONINI 0.06*  --  0.06* 0.06* 0.05*   BNP: Invalid input(s): POCBNP CBG:  Recent Labs Lab 10/16/16 1621 10/16/16 2127 10/17/16 0751 10/17/16 1203 10/17/16 1638  GLUCAP 163* 197* 198* 204* 166*   HbA1C:  Recent Labs  10/15/16 0624  HGBA1C 5.7*   Urine analysis:    Component Value Date/Time   COLORURINE YELLOW 10/13/2016 0640   APPEARANCEUR HAZY (A) 10/13/2016 0640   LABSPEC 1.009 10/13/2016 0640  PHURINE 5.0 10/13/2016 0640   GLUCOSEU 50 (A) 10/13/2016 0640   HGBUR NEGATIVE 10/13/2016 0640   BILIRUBINUR NEGATIVE 10/13/2016 0640   KETONESUR NEGATIVE 10/13/2016 0640   PROTEINUR 100 (A) 10/13/2016 0640   UROBILINOGEN 0.2 04/25/2014 2315   NITRITE NEGATIVE 10/13/2016 0640   LEUKOCYTESUR LARGE (A) 10/13/2016 0640   Sepsis Labs: @LABRCNTIP (procalcitonin:4,lacticidven:4) )No results found for this or any previous visit (from the past 240 hour(s)).   Scheduled Meds: . albuterol  2.5 mg Nebulization Q12H  . amLODipine  10 mg Oral Daily  . aspirin  81 mg Oral Daily  . atorvastatin  40 mg Oral q1800  . calcitRIOL  0.25 mcg Oral Daily  . hydrALAZINE  50 mg Oral Q8H  . insulin aspart  0-9 Units Subcutaneous TID WC  . [START ON 10/18/2016] metoprolol succinate  100 mg Oral Daily  . nitroGLYCERIN  1 inch Topical Q8H  . pantoprazole  40 mg Oral Q0600  . potassium chloride  40 mEq Oral Daily  . sodium bicarbonate  650 mg Oral TID  . sodium chloride flush  10-40 mL Intracatheter Q12H  . sodium chloride flush  3 mL Intravenous Q12H  . torsemide  60 mg Oral Daily    Continuous Infusions: . sodium chloride    . sodium chloride 100 mL/hr at 10/17/16 1610    Procedures/Studies: US Renal  Result Date: 10/13/2016 CLINICAL DATA:  Acute on chronic renal failure EXAM: RENAL / URINARY TRACT ULTRASOUND COMPLETE COMPARISON:  Renal ultrasound of January 17, 2013. FINDINGS: Right Kidney: Length: 11.4 cm. The renal cortical echotexture is increased and is approximately equal to that of the liver. There is diffuse cortical thinning. There is no hydronephrosis. Left Kidney: Length: Bowel gas obscures the left kidney. Bladder: Appears normal for degree of bladder distention. IMPRESSION: Nonvisualization of the left kidney likely secondary to increased bowel gas on the left. Cortical thinning and increased renal cortical echotexture the right kidney is consistent with medical renal disease. No hydronephrosis is observed. Electronically Signed   By: Shaunte Tuft  [place] M.D.   On: 10/13/2016 15:41   US Venous Img Lower Bilateral  Result Date: 10/13/2016 CLINICAL DATA:  Bilateral lower extremity pain and swelling, initial encounter EXAM: BILATERAL LOWER EXTREMITY VENOUS DOPPLER ULTRASOUND TECHNIQUE: Gray-scale sonography with graded compression, as well as color Doppler and duplex ultrasound were performed to evaluate the lower extremity deep venous systems from the level of the common femoral vein and including the common femoral, femoral, profunda femoral, popliteal and calf veins including the posterior tibial, peroneal and gastrocnemius veins when visible. The superficial great saphenous vein was also interrogated. Spectral Doppler was utilized to evaluate flow at rest and with distal augmentation maneuvers in the common femoral, femoral and popliteal veins. COMPARISON:  None. FINDINGS: RIGHT LOWER EXTREMITY Common Femoral Vein: No evidence of thrombus. Normal compressibility, respiratory phasicity and response to augmentation. Saphenofemoral Junction: No evidence of thrombus. Normal  compressibility and flow on color Doppler imaging. Profunda Femoral Vein: No evidence of thrombus. Normal compressibility and flow on color Doppler imaging. Femoral Vein: No evidence of thrombus. Normal compressibility, respiratory phasicity and response to augmentation. Popliteal Vein: No evidence of thrombus. Normal compressibility, respiratory phasicity and response to augmentation. Calf Veins: No evidence of thrombus. Normal compressibility and flow on color Doppler imaging. Superficial Great Saphenous Vein: No evidence of thrombus. Normal compressibility and flow on color Doppler imaging. Venous Reflux:  None. Other Findings:  Mild calf edema is noted. LEFT LOWER EXTREMITY Common Femoral Vein: No evidence  of thrombus. Normal compressibility, respiratory phasicity and response to augmentation. Saphenofemoral Junction: No evidence of thrombus. Normal compressibility and flow on color Doppler imaging. Profunda Femoral Vein: No evidence of thrombus. Normal compressibility and flow on color Doppler imaging. Femoral Vein: No evidence of thrombus. Normal compressibility, respiratory phasicity and response to augmentation. Popliteal Vein: No evidence of thrombus. Normal compressibility, respiratory phasicity and response to augmentation. Calf Veins: No evidence of thrombus. Normal compressibility and flow on color Doppler imaging. Superficial Great Saphenous Vein: No evidence of thrombus. Normal compressibility and flow on color Doppler imaging. Venous Reflux:  None. Other Findings:  Mild calf edema is noted. IMPRESSION: No evidence of DVT within either lower extremity. Electronically Signed   By: Inez Catalina M.D.   On: 10/13/2016 15:47   Dg Chest Port 1 View  Result Date: 10/16/2016 CLINICAL DATA:  Central line placement. EXAM: PORTABLE CHEST 1 VIEW COMPARISON:  October 13, 2016 FINDINGS: A new left subclavian line is identified terminating in the SVC. No pneumothorax. Mild pulmonary venous congestion. Mild  opacity in the right base is likely atelectasis. Cardiomegaly. No other abnormalities or changes. IMPRESSION: Cardiomegaly.  Pulmonary venous congestion. New left central line terminating in the SVC.  No pneumothorax. Electronically Signed   By: Dorise Bullion III M.D   On: 10/16/2016 13:11   Dg Chest Port 1 View  Result Date: 10/13/2016 CLINICAL DATA:  Shortness of breath and chest tightness EXAM: PORTABLE CHEST 1 VIEW COMPARISON:  04/26/2014 FINDINGS: Moderate cardiomegaly with globular cardiac configuration, increased compared to prior radiograph. Central vascular congestion and mild interstitial perihilar edema. No pleural effusion. Aortic atherosclerosis. No pneumothorax. Electronic device over the left chest. IMPRESSION: Moderate cardiomegaly with globular cardiac configuration, findings could be secondary to multi chamber enlargement or pericardial effusion. There is central vascular congestion and mild perihilar edema. Electronically Signed   By: Donavan Foil M.D.   On: 10/13/2016 03:25    Isidoro Santillana, DO  [facility] Pager [phone]  If 7PM-7AM, please contact night-coverage [email or web address] Password [email or web address] 10/17/2016, 4:54 PM   LOS: 4 days

## 2016-10-18 ENCOUNTER — Inpatient Hospital Stay (HOSPITAL_COMMUNITY): Payer: 59

## 2016-10-18 DIAGNOSIS — N183 Chronic kidney disease, stage 3 unspecified: Secondary | ICD-10-CM | POA: Diagnosis not present

## 2016-10-18 DIAGNOSIS — N179 Acute kidney failure, unspecified: Secondary | ICD-10-CM | POA: Diagnosis not present

## 2016-10-18 LAB — CBC
HEMATOCRIT: 23.8 % — AB (ref 36.0–46.0)
Hemoglobin: 7.8 g/dL — ABNORMAL LOW (ref 12.0–15.0)
MCH: 28.9 pg (ref 26.0–34.0)
MCHC: 32.8 g/dL (ref 30.0–36.0)
MCV: 88.1 fL (ref 78.0–100.0)
PLATELETS: 345 10*3/uL (ref 150–400)
RBC: 2.7 MIL/uL — ABNORMAL LOW (ref 3.87–5.11)
RDW: 14.8 % (ref 11.5–15.5)
WBC: 13.7 10*3/uL — AB (ref 4.0–10.5)

## 2016-10-18 LAB — RENAL FUNCTION PANEL
Albumin: 3 g/dL — ABNORMAL LOW (ref 3.5–5.0)
Anion gap: 12 (ref 5–15)
BUN: 54 mg/dL — AB (ref 6–20)
CHLORIDE: 103 mmol/L (ref 101–111)
CO2: 23 mmol/L (ref 22–32)
CREATININE: 4.79 mg/dL — AB (ref 0.44–1.00)
Calcium: 8 mg/dL — ABNORMAL LOW (ref 8.9–10.3)
GFR calc Af Amer: 10 mL/min — ABNORMAL LOW (ref >60)
GFR, EST NON AFRICAN AMERICAN: 9 mL/min — AB (ref >60)
GLUCOSE: 157 mg/dL — AB (ref 65–99)
POTASSIUM: 3.8 mmol/L (ref 3.5–5.1)
Phosphorus: 5.2 mg/dL — ABNORMAL HIGH (ref 2.5–4.6)
Sodium: 138 mmol/L (ref 135–145)

## 2016-10-18 LAB — UIFE/LIGHT CHAINS/TP QN, 24-HR UR
% BETA, Urine: 16.3 %
ALPHA 1 URINE: 3.4 %
Albumin, U: 61.9 %
Alpha 2, Urine: 8.5 %
Free Kappa/Lambda Ratio: 7.51 (ref 2.04–10.37)
Free Lambda Lt Chains,Ur: 17.3 mg/L — ABNORMAL HIGH (ref 0.24–6.66)
Free Lt Chn Excr Rate: 130 mg/L — ABNORMAL HIGH (ref 1.35–24.19)
GAMMA GLOBULIN URINE: 9.9 %
Total Protein, Urine-Ur/day: 2033 mg/24 hr — ABNORMAL HIGH (ref 30–150)
Total Protein, Urine: 121.4 mg/dL
Total Volume: 1675

## 2016-10-18 LAB — GLUCOSE, CAPILLARY
GLUCOSE-CAPILLARY: 246 mg/dL — AB (ref 65–99)
Glucose-Capillary: 161 mg/dL — ABNORMAL HIGH (ref 65–99)
Glucose-Capillary: 157 mg/dL — ABNORMAL HIGH (ref 65–99)
Glucose-Capillary: 180 mg/dL — ABNORMAL HIGH (ref 65–99)

## 2016-10-18 LAB — PROCALCITONIN: Procalcitonin: 0.39 ng/mL

## 2016-10-18 MED ORDER — ATORVASTATIN CALCIUM 40 MG PO TABS: 40.0000 mg | ORAL_TABLET | Freq: Every day | ORAL | 0 refills | Status: DC

## 2016-10-18 MED ORDER — METOPROLOL SUCCINATE ER 100 MG PO TB24: 100.0000 mg | ORAL_TABLET | Freq: Every day | ORAL | 0 refills | Status: DC

## 2016-10-18 MED ORDER — TORSEMIDE 20 MG PO TABS: 40.0000 mg | ORAL_TABLET | Freq: Every day | ORAL | 0 refills | Status: DC

## 2016-10-18 MED ORDER — SODIUM BICARBONATE 650 MG PO TABS: 650.0000 mg | ORAL_TABLET | Freq: Three times a day (TID) | ORAL | 0 refills | Status: DC

## 2016-10-18 MED ORDER — HYDRALAZINE HCL 50 MG PO TABS: 50.0000 mg | ORAL_TABLET | Freq: Three times a day (TID) | ORAL | 3 refills | Status: DC

## 2016-10-18 MED ORDER — POTASSIUM CHLORIDE CRYS ER 20 MEQ PO TBCR: 40.0000 meq | EXTENDED_RELEASE_TABLET | Freq: Every day | ORAL | 0 refills | Status: DC

## 2016-10-18 MED ORDER — CALCITRIOL 0.25 MCG PO CAPS: 0.2500 ug | ORAL_CAPSULE | Freq: Every day | ORAL | 0 refills | Status: DC

## 2016-10-18 MED ORDER — FUROSEMIDE 10 MG/ML IJ SOLN
40.0000 mg | Freq: Once | INTRAMUSCULAR | Status: AC
  Administered 2016-10-18: 40 mg via INTRAVENOUS
  Filled 2016-10-18: qty 4

## 2016-10-18 NOTE — Evaluation (Signed)
Clinical/Bedside Swallow Evaluation Patient Details  Name: Kelli [name] MRN: [id number] Date of Birth: [birth year]  Today's Date: 10/18/2016 Time: SLP Start Time (ACUTE ONLY): 85 SLP Stop Time (ACUTE ONLY): 1618 SLP Time Calculation (min) (ACUTE ONLY): 54 min  Past Medical History:  Past Medical History:  Diagnosis Date  . Borderline diabetes   . Carotid artery occlusion    Occluded RICA, status post left CEA  August 2014 - Dr. Donnetta Hutching  . Cerebral infarction [facility]) Aug 2014   Bihemispheric watershed infarcts  . Cerebral infarction involving left cerebellar artery [facility]) Feb 2015  . CKD (chronic kidney disease) stage 3, GFR 30-59 ml/min   . Closed dislocation of left humerus 07/26/2013  . Essential hypertension, benign   . Fibromyalgia   . Mixed hyperlipidemia   . Multiple gastric ulcers   . Urinary incontinence    Past Surgical History:  Past Surgical History:  Procedure Laterality Date  . COMBINED HYSTERECTOMY VAGINAL W/ MMK / A&P REPAIR  1981  . ENDARTERECTOMY Left 10/06/2012   Procedure: Carotid Endarterectomy with Finesse patch angioplasty;  Surgeon: [name], MD;  Location: [place];  Service: Vascular;  Laterality: Left;  . ESOPHAGOGASTRODUODENOSCOPY N/A 04/26/2014   Procedure: ESOPHAGOGASTRODUODENOSCOPY (EGD);  Surgeon: Lear Ng, MD;  Location: [facility] ENDOSCOPY;  Service: Endoscopy;  Laterality: N/A;  . LOOP RECORDER IMPLANT  04/16/13   MDT LinQ implanted for cryptogenic stroke  . [name] WITHOUT CARDIOVERSION N/A 04/16/2013   Procedure: TRANSESOPHAGEAL ECHOCARDIOGRAM ([name]);  Surgeon: Josue Hector, MD;  Location: [facility] ENDOSCOPY;  Service: Cardiovascular;  Laterality: N/A;  . URETHRAL DILATION  1980's  . VAGINAL HYSTERECTOMY  1981   "partial" (10/04/2012)   HPI:  Kelli [name] is a 66 year old female with a history of stroke with residual left hemiparesis, CKD stage III, essential hypertension, diabetes mellitus, peripheral vascular disease presented when she woke up with substernal  chest discomfort in the early morning of 10/13/2016. Kelli [name] with prior ST intervention following most recent CVA  including MBS (2015) with hx of moderate sensory motor oropharygneal dysphagia per prior ST encounters   Assessment / Plan / Recommendation Clinical Impression  Kelli [name] presents with a suspected moderate pharyngeal dysphagia of multifactorial etiology including prior CVA. Kelli [name] with a hx of a moderate sensory motor oropharyngeal dysphagia following her CVA in 2015, which she received therapy for and tolerated diet advancement to thin liquids and regular consistencies. However this date Kelli [name] appears deconditioning with decreased respiratory status and a suspected decline in swallow function. Kelli [name] with congested breath sounds prior to PO trials. Immediate and delayed coughing exhibited across all consistencies including thickened liquids. Recommend proceed to MBS to objectively determine safest diet. MBS planned this afternoon.  SLP Visit Diagnosis: Dysphagia, unspecified (R13.10)    Aspiration Risk  Moderate aspiration risk    Diet Recommendation   pending completion of MBS       Other  Recommendations Oral Care Recommendations: Oral care BID   Follow up Recommendations 24 hour supervision/assistance      Frequency and Duration min 1 x/week  2 weeks       Prognosis Prognosis for Safe Diet Advancement: Fair Barriers to Reach Goals: Severity of deficits      Swallow Study   General Date of Onset: 10/13/16 HPI: Kelli [name] is a 66 year old female with a history of stroke with residual left hemiparesis, CKD stage III, essential hypertension, diabetes mellitus, peripheral vascular disease presented when she woke up with substernal chest discomfort in the early morning of 10/13/2016. Kelli [name]  with prior ST intervention following most recent CVA  including MBS (2015) with hx of moderate sensory motor oropharygneal dysphagia per prior ST encounters Type of Study: Bedside Swallow Evaluation Previous Swallow  Assessment: MBS 2015: nectar, fine chopped, later advancing to thin and mechanical soft  Diet Prior to this Study: Regular;Thin liquids Temperature Spikes Noted: No Respiratory Status: Nasal cannula History of Recent Intubation: No Behavior/Cognition: Alert;Cooperative;Pleasant mood Oral Cavity Assessment: Within Functional Limits Oral Cavity - Dentition: Adequate natural dentition Self-Feeding Abilities: Needs assist Patient Positioning: Upright in bed Baseline Vocal Quality: Low vocal intensity Volitional Cough: Congested;Weak Volitional Swallow: Able to elicit    Oral/Motor/Sensory Function Overall Oral Motor/Sensory Function: Generalized oral weakness Facial Symmetry: Within Functional Limits Facial Sensation:  (admits residual left sided drooling from prior CVA)   Ice Chips Ice chips: Impaired Presentation: Spoon Oral Phase Functional Implications: Prolonged oral transit Pharyngeal Phase Impairments: Suspected delayed Swallow;Multiple swallows;Cough - Immediate;Cough - Delayed   Thin Liquid Thin Liquid: Impaired Presentation: Spoon;Cup;Straw Oral Phase Functional Implications: Prolonged oral transit Pharyngeal  Phase Impairments: Suspected delayed Swallow;Multiple swallows;Cough - Immediate;Cough - Delayed    Nectar Thick Nectar Thick Liquid: Impaired Presentation: Spoon;Cup Oral phase functional implications: Prolonged oral transit Pharyngeal Phase Impairments: Suspected delayed Swallow;Multiple swallows;Cough - Immediate;Cough - Delayed   Honey Thick Honey Thick Liquid: Impaired Presentation: Spoon Oral Phase Functional Implications: Prolonged oral transit Pharyngeal Phase Impairments: Suspected delayed Swallow;Multiple swallows;Cough - Immediate;Cough - Delayed   Puree Puree: Impaired Presentation: Spoon Oral Phase Functional Implications: Prolonged oral transit Pharyngeal Phase Impairments: Suspected delayed Swallow;Multiple swallows;Cough - Immediate;Cough - Delayed    Solid   GO   Solid: Not tested       Arvil Chaco MA, CCC-SLP Acute Care Speech Language Pathologist    Levi Aland 10/18/2016,4:27 PM

## 2016-10-18 NOTE — Progress Notes (Addendum)
  Diet regular healthy Subjective: Interval History: The patient offers no complaints. Her appetite doesn't seem to be good. No nausea or vomiting.    Vital signs in last 24 hours: Temp:  [97.8 F (36.6 C)-98.4 F (36.9 C)] 97.8 F (36.6 C) (08/20 0453) Pulse Rate:  [84-95] 95 (08/20 0453) Resp:  [18-20] 20 (08/20 0453) BP: (137-180)/(73-90) 142/88 (08/20 0453) SpO2:  [92 %-96 %] 92 % (08/20 0822) Weight:  [86.8 kg (191 lb 5.8 oz)] 86.8 kg (191 lb 5.8 oz) (08/20 0453) Weight change: 0.9 kg (1 lb 15.8 oz)  Intake/Output from previous day: 08/19 0701 - 08/20 0700 In: 1260 [P.O.:960; I.V.:300] Out: 2200 [Urine:2200] Intake/Output this shift: No intake/output data recorded.  Generally: Patient is more alert today and answered questions by shaking her head. Some of the information is received from [name]. chest: Decreased breath sound otherwise seems to be clear Heart exam regular rate and rhythm no murmur Extremities: She has trace edema  Lab Results: No results for input(s): WBC, HGB, HCT, PLT in the last 72 hours. BMET:   Recent Labs  10/16/16 1341 10/17/16 0830  NA 135 138  K 3.5 3.6  CL 101 101  CO2 23 24  GLUCOSE 255* 207*  BUN 50* 51*  CREATININE 4.84* 4.62*  CALCIUM 7.4* 8.0*   No results for input(s): PTH in the last 72 hours. Iron Studies:  No results for input(s): IRON, TIBC, TRANSFERRIN, FERRITIN in the last 72 hours.  Studies/Results: Dg Chest Port 1 View  Result Date: 10/16/2016 CLINICAL DATA:  Central line placement. EXAM: PORTABLE CHEST 1 VIEW COMPARISON:  October 13, 2016 FINDINGS: A new left subclavian line is identified terminating in the SVC. No pneumothorax. Mild pulmonary venous congestion. Mild opacity in the right base is likely atelectasis. Cardiomegaly. No other abnormalities or changes. IMPRESSION: Cardiomegaly.  Pulmonary venous congestion. New left central line terminating in the SVC.  No pneumothorax. Electronically Signed   By: Dorise Bullion III M.D   On: 10/16/2016 13:11    I have reviewed the patient's current medications.  Assessment/Plan: Problem #1 renal failure: Acute on chronic versus natural progression of her disease. At this moment superimposed prerenal syndrome versus ATN . Her creatinine came down to 4.79  yesterday. . She still has adequate urine output. She is on Demadex. Problem #2 chronic renal failure: Stage III. Etiology could be ischemic/diabetes/hypertension/recurrent acute kidney injury/patient also with recurrent ureteral tract infection treated with different antibiotics. Patient was seen by urology at one time. Ultrasound of the kidney was not able to visualize left kidney. However ultrasound from 2014 showed left kidney to be 8.7 cm with the right one about 10.4. Problem #3 anemia: Her hemoglobin is below target goal but stable.  Problem #4 bone mineral disorder: His calcium and phosphorus is in range. Her PTH however his high. Because of that patient is on Rocaltrol. Patient is not started on a binder. Problem #5 history of CVA: Still no significant change. Patient unable to ambulate. Problem #6 history of cardiac erythemia: She has  history of loop recorder implant. Problem #7 nausea and poor appetite has improved according to her husband. No nausea or vomiting. Problem #8 difficulty breathing: she is on Demadex and patient is asymptomatic. Plan: We'll continue with hydration 2] patient does not require dialysis. 3] we'll check her renal panel in am     LOS: 5 days   Lazer Wollard S 10/18/2016,8:46 AM

## 2016-10-18 NOTE — Progress Notes (Signed)
Upon entering patient's room she was alert and oriented with wet lung sounds and congested cough.  Her vitals are WDL and patient denies increase in shortness of breathe more than her baseline.  Dr. Marin Comment notified via phone and new orders were given and carried out.  Nursing staff to continue to monitor

## 2016-10-18 NOTE — Progress Notes (Signed)
Patient observed to strangle easily on food, fluids and when taking meds. Speech swallowing consult requested.

## 2016-10-18 NOTE — Progress Notes (Signed)
PROGRESS NOTE  Kelli [name] KGM:[id number] DOB: October 06, 1950 DOA: 10/13/2016 PCP: Manon Hilding, MD  Brief History: 66 year old female with a history of stroke with residual left hemiparesis, CKD stage III, essential hypertension, diabetes mellitus, peripheral vascular disease presented when she woke up with substernal chest discomfort in the early morning of 10/13/2016. The patient has some associated shortness of breath and diaphoresis. She denies any fevers, chills, headache, neck pain, dysuria, abdominal pain, hematochezia, melena. The patient states that she often feels nauseous when she first wakes up in the morning resulting in an episode of emesis. This has happened for many years. Upon presentation, the patient was noted to have hemoglobin of 6.3 and serum creatinine of 4.50. Chest x-ray showed central vascular congestion and interstitial edema. The patient was started on intravenous furosemide and admitted for further workup of her renal failure. Nephrology and cardiology were consulted to assist.  Assessment/Plan: Acute respiratory failure with hypoxia -due to CHF -stable on 2.5L -wean oxygen for saturation >92% -10/18/16--more dyspneic -8/20 CXR--personally reviewed--worsened interstitial edema; small bilateral pleural effusions -8/20--give additional furosemide 40 mg IV 1  Acute on chronic diastolic CHF -daily weights--NEG 9 lbs for the admission -accurate I/O's--not accurate -fluid restrict -10/14/16-Echo--EF 55-60%, grade 2 DD, no WMA. -torsemide and fluids per nephrology -cannot use ACEi due to renal failure -10/18/16--more dyspneic -8/20 CXR--personally reviewed--worsened interstitial edema; small bilateral pleural effusions -8/20--give additional furosemide 40 mg IV 1  Atypical chest pain/Elevated troponin -appreciatecardiology -troponin trend flat -pt also has loop recorder placed Feb 2015 after stroke -elevated troponin due to decompensated  CHF  Acute on chronic renal failure--CKD stage 3 -baseline creatinine 1.2-1.5 but no recent BMP -last serum creatinine 1.49 on 04/29/2014 -likely has progression of her underlying CKD with acute worsening possibly due to hemodynamic factors from CHF. -Appreciate nephrology follow-up -I Contacted the patient's PCP--no blood work since 2015 in office record -renal US--nonvisualization of the left kidney secondary to bowel gas.Right renal echogenicity without hydronephrosis. 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 Talbert Cage  Anemia of CKD--symptomatic anemia -transfused 2units PRBCs for the admssion -8/15 FOBT neg -10/13/16--Iron saturation 11%, ferritin 67, folic acid 8.7 -6/44/03 Hgb drop to 7.8 -repeat FOBT -check LDH, haptoglobin -am CBC  Essential HTN-uncontrolled -continue metoprolol succinate -cannot use ACEi due to renal failure -continue amlodipine and hydralazine -increased hydralazine dose 50 mg q 8 hours -increase metoprolol succinate to 100 mg daily  Hx of Stroke with left hemiparesis  -continue ASA -check lipid panel--LDL57 -pt has loop recorder placed Feb 2015 -continue statin -PT eval--pt refuses SNF  Diabetes mellitus -d/c metformin altogether due to renal failure -8/17check A1C--5.7 -CBGs increased as pt eating more -change to moderate scale SSI  Hyperlipidemia -continue statin  Leg edema and pain -venous duplex--neg  Hypokalemia -replete -check mag--2.1  Difficult IV access -consulted Dr. Arnoldo Morale for central line--placed 10/16/16    Disposition Plan: Home 8/21 if stable Family Communication: sisterupdatedat bedside 8/20--Total time spent 35 minutes.  Greater than 50% spent face to face counseling and coordinating  care.    Consultants: cardiology, nephrology  Code Status: FULL   DVT Prophylaxis: [place] Heparin   Procedures: As Listed in Progress Note Above  Antibiotics: None   Subjective: Patient more short of breath today. She denies any fevers, chills, headache, neck pain, chest pain,  abdominal pain. No dysuria or hematuria. No rashes.  Objective: Vitals:   10/18/16 0453 10/18/16 0822 10/18/16 1400 10/18/16 1506  BP: (!) 142/88  138/73 123/69  Pulse: 95  86 85  Resp: 20   18  Temp: 97.8 F (36.6 C)   97.6 F (36.4 C)  TempSrc: Oral   Oral  SpO2: 93% 92% 93% 97%  Weight: 86.8 kg (191 lb 5.8 oz)     Height:        Intake/Output Summary (Last 24 hours) at 10/18/16 1743 Last data filed at 10/18/16 1400  Gross per 24 hour  Intake              620 ml  Output             2400 ml  Net            -1780 ml   Weight change: 0.9 kg (1 lb 15.8 oz) Exam:   General:  Pt is alert, follows commands appropriately, not in acute distress  HEENT: No icterus, No thrush, No neck mass, [place]/AT  Cardiovascular: RRR, S1/S2, no rubs, no gallops  Respiratory: CTA bilaterally, no wheezing, no crackles, no rhonchi  Abdomen: Soft/+BS, non tender, non distended, no guarding  Extremities: No edema, No lymphangitis, No petechiae, No rashes, no synovitis   Data Reviewed: I have personally reviewed following labs and imaging studies Basic Metabolic Panel:  Recent Labs Lab 10/14/16 0611 10/15/16 0624 10/16/16 1341 10/17/16 0830 10/18/16 0653  NA 137 137 135 138 138  K 3.5 3.3* 3.5 3.6 3.8  CL 106 103 101 101 103  CO2 20* 21* 23 24 23   GLUCOSE 170* 171* 255* 207* 157*  BUN 41* 47* 50* 51* 54*  CREATININE 4.51* 4.61* 4.84* 4.62* 4.79*  CALCIUM 7.8*  7.6* 7.6* 7.4* 8.0* 8.0*  MG 1.6*  --  1.5* 2.1  --   PHOS 5.9* 5.6* 4.7* 5.4* 5.2*   Liver Function Tests:  Recent Labs Lab 10/13/16 0352 10/14/16 0611 10/15/16 0624 10/16/16 1341 10/17/16 0830 10/18/16 0653  AST 13*  --    --   --   --   --   ALT 11*  --   --   --   --   --   ALKPHOS 68  --   --   --   --   --   BILITOT 0.4  --   --   --   --   --   PROT 6.9  --   --   --   --   --   ALBUMIN 3.3* 3.1* 3.1* 3.0* 3.3* 3.0*   No results for input(s): LIPASE, AMYLASE in the last 168 hours. No results for input(s): AMMONIA in the last 168 hours. Coagulation Profile: No results for input(s): INR, PROTIME in the last 168 hours. CBC:  Recent Labs Lab 10/13/16 0352 10/13/16 0551 10/13/16 1951 10/14/16 0611 10/15/16 0624 10/18/16 1659  WBC 13.8* 16.5*  --  16.7* 13.4* 13.7*  NEUTROABS 11.5*  --   --   --   --   --   HGB 6.3* 6.9* 9.1* 9.3* 9.6* 7.8*  HCT 19.2* 20.8* 26.7* 26.9* 28.2* 23.8*  MCV 85.3 86.0  --  85.4 86.0 88.1  PLT 299 376  --  332 331 345   Cardiac Enzymes:  Recent Labs Lab 10/13/16 0352 10/13/16 0551 10/13/16 0659 10/13/16 1156 10/13/16 1951  CKTOTAL  --  20*  --   --   --  TROPONINI 0.06*  --  0.06* 0.06* 0.05*   BNP: Invalid input(s): POCBNP CBG:  Recent Labs Lab 10/17/16 1638 10/17/16 2102 10/18/16 0754 10/18/16 1123 10/18/16 1712  GLUCAP 166* 152* 161* 246* 157*   HbA1C: No results for input(s): HGBA1C in the last 72 hours. Urine analysis:    Component Value Date/Time   COLORURINE YELLOW 10/13/2016 0640   APPEARANCEUR HAZY (A) 10/13/2016 0640   LABSPEC 1.009 10/13/2016 0640   PHURINE 5.0 10/13/2016 0640   GLUCOSEU 50 (A) 10/13/2016 0640   HGBUR NEGATIVE 10/13/2016 0640   BILIRUBINUR NEGATIVE 10/13/2016 0640   KETONESUR NEGATIVE 10/13/2016 0640   PROTEINUR 100 (A) 10/13/2016 0640   UROBILINOGEN 0.2 04/25/2014 2315   NITRITE NEGATIVE 10/13/2016 0640   LEUKOCYTESUR LARGE (A) 10/13/2016 0640   Sepsis Labs: @LABRCNTIP (procalcitonin:4,lacticidven:4) )No results found for this or any previous visit (from the past 240 hour(s)).   Scheduled Meds: . albuterol  2.5 mg Nebulization Q12H  . amLODipine  10 mg Oral Daily  . aspirin  81 mg Oral Daily  .  atorvastatin  40 mg Oral q1800  . calcitRIOL  0.25 mcg Oral Daily  . furosemide  40 mg Intravenous Once  . hydrALAZINE  50 mg Oral Q8H  . insulin aspart  0-9 Units Subcutaneous TID WC  . metoprolol succinate  100 mg Oral Daily  . nitroGLYCERIN  1 inch Topical Q8H  . pantoprazole  40 mg Oral Q0600  . potassium chloride  40 mEq Oral Daily  . sodium bicarbonate  650 mg Oral TID  . sodium chloride flush  10-40 mL Intracatheter Q12H  . sodium chloride flush  3 mL Intravenous Q12H  . torsemide  60 mg Oral Daily   Continuous Infusions: . sodium chloride    . sodium chloride 10 mL/hr at 10/18/16 3382    Procedures/Studies: US Renal  Result Date: 10/13/2016 CLINICAL DATA:  Acute on chronic renal failure EXAM: RENAL / URINARY TRACT ULTRASOUND COMPLETE COMPARISON:  Renal ultrasound of January 17, 2013. FINDINGS: Right Kidney: Length: 11.4 cm. The renal cortical echotexture is increased and is approximately equal to that of the liver. There is diffuse cortical thinning. There is no hydronephrosis. Left Kidney: Length: Bowel gas obscures the left kidney. Bladder: Appears normal for degree of bladder distention. IMPRESSION: Nonvisualization of the left kidney likely secondary to increased bowel gas on the left. Cortical thinning and increased renal cortical echotexture the right kidney is consistent with medical renal disease. No hydronephrosis is observed. Electronically Signed   By: Chesnee Floren  [place] M.D.   On: 10/13/2016 15:41   US Venous Img Lower Bilateral  Result Date: 10/13/2016 CLINICAL DATA:  Bilateral lower extremity pain and swelling, initial encounter EXAM: BILATERAL LOWER EXTREMITY VENOUS DOPPLER ULTRASOUND TECHNIQUE: Gray-scale sonography with graded compression, as well as color Doppler and duplex ultrasound were performed to evaluate the lower extremity deep venous systems from the level of the common femoral vein and including the common femoral, femoral, profunda femoral, popliteal and  calf veins including the posterior tibial, peroneal and gastrocnemius veins when visible. The superficial great saphenous vein was also interrogated. Spectral Doppler was utilized to evaluate flow at rest and with distal augmentation maneuvers in the common femoral, femoral and popliteal veins. COMPARISON:  None. FINDINGS: RIGHT LOWER EXTREMITY Common Femoral Vein: No evidence of thrombus. Normal compressibility, respiratory phasicity and response to augmentation. Saphenofemoral Junction: No evidence of thrombus. Normal compressibility and flow on color Doppler imaging. Profunda Femoral Vein: No evidence of thrombus. Normal compressibility and  flow on color Doppler imaging. Femoral Vein: No evidence of thrombus. Normal compressibility, respiratory phasicity and response to augmentation. Popliteal Vein: No evidence of thrombus. Normal compressibility, respiratory phasicity and response to augmentation. Calf Veins: No evidence of thrombus. Normal compressibility and flow on color Doppler imaging. Superficial Great Saphenous Vein: No evidence of thrombus. Normal compressibility and flow on color Doppler imaging. Venous Reflux:  None. Other Findings:  Mild calf edema is noted. LEFT LOWER EXTREMITY Common Femoral Vein: No evidence of thrombus. Normal compressibility, respiratory phasicity and response to augmentation. Saphenofemoral Junction: No evidence of thrombus. Normal compressibility and flow on color Doppler imaging. Profunda Femoral Vein: No evidence of thrombus. Normal compressibility and flow on color Doppler imaging. Femoral Vein: No evidence of thrombus. Normal compressibility, respiratory phasicity and response to augmentation. Popliteal Vein: No evidence of thrombus. Normal compressibility, respiratory phasicity and response to augmentation. Calf Veins: No evidence of thrombus. Normal compressibility and flow on color Doppler imaging. Superficial Great Saphenous Vein: No evidence of thrombus. Normal  compressibility and flow on color Doppler imaging. Venous Reflux:  None. Other Findings:  Mild calf edema is noted. IMPRESSION: No evidence of DVT within either lower extremity. Electronically Signed   By: Inez Catalina M.D.   On: 10/13/2016 15:47   Dg Chest Port 1 View  Result Date: 10/18/2016 CLINICAL DATA:  Shortness of breath for several days. Congestive heart failure. EXAM: PORTABLE CHEST 1 VIEW COMPARISON:  Single-view of the chest 10/13/2016 and 10/16/2016. FINDINGS: Left subclavian central venous catheter remains in place with its tip in the proximal superior vena cava. There is cardiomegaly and mild interstitial edema. Small bilateral pleural effusions and mild atelectasis in the bases noted. Aeration appears slightly worse than on the prior exam. IMPRESSION: Mild worsening of interstitial pulmonary edema with associated small bilateral pleural effusions and basilar atelectasis. Electronically Signed   By: Inge Rise M.D.   On: 10/18/2016 14:38   Dg Chest Port 1 View  Result Date: 10/16/2016 CLINICAL DATA:  Central line placement. EXAM: PORTABLE CHEST 1 VIEW COMPARISON:  October 13, 2016 FINDINGS: A new left subclavian line is identified terminating in the SVC. No pneumothorax. Mild pulmonary venous congestion. Mild opacity in the right base is likely atelectasis. Cardiomegaly. No other abnormalities or changes. IMPRESSION: Cardiomegaly.  Pulmonary venous congestion. New left central line terminating in the SVC.  No pneumothorax. Electronically Signed   By: Dorise Bullion III M.D   On: 10/16/2016 13:11   Dg Chest Port 1 View  Result Date: 10/13/2016 CLINICAL DATA:  Shortness of breath and chest tightness EXAM: PORTABLE CHEST 1 VIEW COMPARISON:  04/26/2014 FINDINGS: Moderate cardiomegaly with globular cardiac configuration, increased compared to prior radiograph. Central vascular congestion and mild interstitial perihilar edema. No pleural effusion. Aortic atherosclerosis. No pneumothorax.  Electronic device over the left chest. IMPRESSION: Moderate cardiomegaly with globular cardiac configuration, findings could be secondary to multi chamber enlargement or pericardial effusion. There is central vascular congestion and mild perihilar edema. Electronically Signed   By: Donavan Foil M.D.   On: 10/13/2016 03:25   Dg Swallowing Func-speech Pathology  Result Date: 10/18/2016 Objective Swallowing Evaluation: Type of Study: MBS-Modified Barium Swallow Study Patient Details Name: TARITA DESHMUKH MRN: [id number] Date of Birth: 07/25/1950 Today's Date: 10/18/2016 Time: SLP Start Time (ACUTE ONLY): 1644-SLP Stop Time (ACUTE ONLY): 1701 SLP Time Calculation (min) (ACUTE ONLY): 17 min Past Medical History: Past Medical History: Diagnosis Date . Borderline diabetes  . Carotid artery occlusion   Occluded RICA, status post  left CEA  August 2014 - Dr. Donnetta Hutching . Cerebral infarction [facility]) Aug 2014  Bihemispheric watershed infarcts . Cerebral infarction involving left cerebellar artery [facility]) Feb 2015 . CKD (chronic kidney disease) stage 3, GFR 30-59 ml/min  . Closed dislocation of left humerus 07/26/2013 . Essential hypertension, benign  . Fibromyalgia  . Mixed hyperlipidemia  . Multiple gastric ulcers  . Urinary incontinence  Past Surgical History: Past Surgical History: Procedure Laterality Date . COMBINED HYSTERECTOMY VAGINAL W/ MMK / A&P REPAIR  1981 . ENDARTERECTOMY Left 10/06/2012  Procedure: Carotid Endarterectomy with Finesse patch angioplasty;  Surgeon: [name], MD;  Location: [place];  Service: Vascular;  Laterality: Left; . ESOPHAGOGASTRODUODENOSCOPY N/A 04/26/2014  Procedure: ESOPHAGOGASTRODUODENOSCOPY (EGD);  Surgeon: Lear Ng, MD;  Location: [facility] ENDOSCOPY;  Service: Endoscopy;  Laterality: N/A; . LOOP RECORDER IMPLANT  04/16/13  MDT LinQ implanted for cryptogenic stroke . [name] WITHOUT CARDIOVERSION N/A 04/16/2013  Procedure: TRANSESOPHAGEAL ECHOCARDIOGRAM ([name]);  Surgeon: Josue Hector, MD;  Location: [facility]  ENDOSCOPY;  Service: Cardiovascular;  Laterality: N/A; . URETHRAL DILATION  1980's . VAGINAL HYSTERECTOMY  1981  "partial" (10/04/2012) HPI: Pt is a 66 year old female with a history of stroke with residual left hemiparesis, CKD stage III, essential hypertension, diabetes mellitus, peripheral vascular disease presented when she woke up with substernal chest discomfort in the early morning of 10/13/2016. 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 Consistent delay in swallow initiation exhibited across thin, nectar, and solid consistencies with reduced laryngeal closure resulting in penetration before and after the swallow. Pt cleared the penetrates from the laryngeal vestibule with a reflexive swallow and intermittent spontaneous throat clears. Chin tuck postural maneuver ineffective at preventing penetration. Aspiration evidenced with nectar thick liquid by teaspoon before the swallow which was not sensed. Pt displayed prolonged oral holding of nectar thick consistency and discoordination which was felt to negatively influence swallow safety. Mild vallecular residuals exhibited across PO consistencies secondary to reduced tongue base retraction with intemittent penetration after the swallow.  This was cleared from the laryngeal vestibule by additional reflexive swallows. Recommend thin liquids with regular consistencies and medicines whole in puree with full supervision with all PO to ensure safe swallow precautions. Aspiration risk remains increased in setting of CVA hx, reduced mobility, and decreased respiratory status. ST to follow up for diet tolerance and safe swallow  education.    SLP Visit Diagnosis Dysphagia, oropharyngeal phase (R13.12) Attention and concentration deficit following -- Frontal lobe and executive function deficit following -- Impact on safety and function Mild aspiration risk;Moderate aspiration risk   CHL IP TREATMENT RECOMMENDATION 10/18/2016 Treatment Recommendations Therapy as outlined in treatment plan below   Prognosis 10/18/2016 Prognosis for Safe Diet Advancement Good Barriers to Reach Goals -- Barriers/Prognosis Comment -- CHL IP DIET RECOMMENDATION 10/18/2016 SLP Diet Recommendations Thin liquid;Regular solids Liquid Administration via Cup;Straw Medication Administration Whole meds with puree Compensations Slow rate;Small sips/bites;Multiple dry swallows after each bite/sip Postural Changes Seated upright at 90 degrees;Remain semi-upright after after feeds/meals (Comment)   CHL IP OTHER RECOMMENDATIONS 10/18/2016 Recommended Consults -- Oral Care Recommendations Oral care BID Other Recommendations --   CHL IP FOLLOW UP RECOMMENDATIONS 10/18/2016 Follow up Recommendations 24 hour supervision/assistance   CHL IP  FREQUENCY AND DURATION 10/18/2016 Speech Therapy Frequency (ACUTE ONLY) min 1 x/week Treatment Duration 2 weeks      CHL IP ORAL PHASE 10/18/2016 Oral Phase Impaired Oral - Pudding Teaspoon -- Oral - Pudding Cup -- Oral - Honey Teaspoon -- Oral - Honey Cup -- Oral - Nectar Teaspoon Holding of bolus;Delayed oral transit Oral - Nectar Cup NT Oral - Nectar Straw NT Oral - Thin Teaspoon Delayed oral transit;Piecemeal swallowing Oral - Thin Cup Delayed oral transit;Lingual/palatal residue;Piecemeal swallowing Oral - Thin Straw Delayed oral transit;Piecemeal swallowing Oral - Puree Piecemeal swallowing;Delayed oral transit Oral - Mech Soft NT Oral - Regular Delayed oral transit;Decreased bolus cohesion;Piecemeal swallowing Oral - Multi-Consistency NT Oral - Pill NT Oral Phase - Comment --  CHL IP PHARYNGEAL PHASE 10/18/2016 Pharyngeal Phase Impaired  Pharyngeal- Pudding Teaspoon -- Pharyngeal -- Pharyngeal- Pudding Cup -- Pharyngeal -- Pharyngeal- Honey Teaspoon -- Pharyngeal -- Pharyngeal- Honey Cup -- Pharyngeal -- Pharyngeal- Nectar Teaspoon Penetration/Aspiration before swallow;Penetration/Apiration after swallow;Reduced airway/laryngeal closure;Pharyngeal residue - valleculae Pharyngeal Material enters airway, passes BELOW cords without attempt by patient to eject out (silent aspiration) Pharyngeal- Nectar Cup NT Pharyngeal -- Pharyngeal- Nectar Straw -- Pharyngeal -- Pharyngeal- Thin Teaspoon Reduced airway/laryngeal closure;Penetration/Aspiration before swallow;Penetration/Apiration after swallow;Penetration/Aspiration during swallow;Pharyngeal residue - valleculae;Reduced tongue base retraction Pharyngeal Material enters airway, CONTACTS cords and then ejected out;Material enters airway, CONTACTS cords and not ejected out Pharyngeal- Thin Cup Reduced airway/laryngeal closure;Reduced tongue base retraction;Pharyngeal residue - valleculae;Penetration/Aspiration before swallow;Penetration/Apiration after swallow Pharyngeal Material enters airway, CONTACTS cords and then ejected out Pharyngeal- Thin Straw Penetration/Apiration after swallow;Reduced tongue base retraction;Penetration/Aspiration before swallow;Pharyngeal residue - valleculae;Reduced airway/laryngeal closure Pharyngeal Material enters airway, remains ABOVE vocal cords then ejected out Pharyngeal- Puree Delayed swallow initiation-vallecula;Pharyngeal residue - valleculae;Reduced tongue base retraction Pharyngeal Material does not enter airway Pharyngeal- Mechanical Soft NT Pharyngeal -- Pharyngeal- Regular Delayed swallow initiation-vallecula;Reduced tongue base retraction;Pharyngeal residue - valleculae;Penetration/Aspiration before swallow;Reduced airway/laryngeal closure Pharyngeal Material enters airway, remains ABOVE vocal cords then ejected out Pharyngeal- Multi-consistency NT Pharyngeal  -- Pharyngeal- Pill NT Pharyngeal -- Pharyngeal Comment --  CHL IP CERVICAL ESOPHAGEAL PHASE 10/18/2016 Cervical Esophageal Phase WFL Pudding Teaspoon -- Pudding Cup -- Honey Teaspoon -- Honey Cup -- Nectar Teaspoon -- Nectar Cup -- Nectar Straw -- Thin Teaspoon -- Thin Cup -- Thin Straw -- Puree -- Mechanical Soft -- Regular -- Multi-consistency -- Pill -- Cervical Esophageal Comment -- No flowsheet data found. Arvil Chaco MA, CCC-SLP Acute Care Speech Language Pathologist  Levi Aland 10/18/2016, 5:28 PM               Rufus Beske, DO  [facility] Pager [phone]  If 7PM-7AM, please contact night-coverage [email or web address] Password [email or web address] 10/18/2016, 5:43 PM   LOS: 5 days

## 2016-10-18 NOTE — Progress Notes (Signed)
Modified Barium Swallow Progress Note  Patient Details  Name: Kelli [name] MRN: [id number] Date of Birth: 1950-10-04  Today's Date: 10/18/2016  Modified Barium Swallow completed.  Full report located under Chart Review in the Imaging Section.  Brief recommendations include the following:  Clinical Impression  Pt presents with a mild to moderate oropharyngeal dysphagia of prior neurogenic etiology with sensory motor involvement. Consistent delay in swallow initiation exhibited across thin, nectar, and solid consistencies with reduced laryngeal closure resulting in penetration before and after the swallow. Pt cleared the penetrates from the laryngeal vestibule with a reflexive swallow and intermittent spontaneous throat clears. Chin tuck postural maneuver ineffective at preventing penetration. Aspiration evidenced with nectar thick liquid by teaspoon before the swallow which was not sensed. Pt displayed prolonged oral holding of nectar thick consistency and discoordination which was felt to negatively influence swallow safety. Mild vallecular residuals exhibited across PO consistencies secondary to reduced tongue base retraction with intemittent penetration after the swallow.  This was cleared from the laryngeal vestibule by additional reflexive swallows. Recommend thin liquids with regular consistencies and medicines whole in puree with full supervision with all PO to ensure safe swallow precautions. Aspiration risk remains increased in setting of CVA hx, reduced mobility, and decreased respiratory status. ST to follow up for diet tolerance and safe swallow education.      Swallow Evaluation Recommendations       SLP Diet Recommendations: Thin liquid;Regular solids   Liquid Administration via: Cup;Straw   Medication Administration: Whole meds with puree   Supervision: Staff to assist with self feeding;Full supervision/cueing for compensatory strategies   Compensations: Slow rate;Small  sips/bites;Multiple dry swallows after each bite/sip   Postural Changes: Seated upright at 90 degrees;Remain semi-upright after after feeds/meals (Comment)   Oral Care Recommendations: Oral care BID       Arvil Chaco MA, CCC-SLP Acute Care Speech Language Pathologist    Levi Aland 10/18/2016,5:28 PM

## 2016-10-19 ENCOUNTER — Inpatient Hospital Stay (HOSPITAL_COMMUNITY): Payer: 59

## 2016-10-19 LAB — RENAL FUNCTION PANEL
ALBUMIN: 3 g/dL — AB (ref 3.5–5.0)
Anion gap: 11 (ref 5–15)
BUN: 58 mg/dL — AB (ref 6–20)
CALCIUM: 8 mg/dL — AB (ref 8.9–10.3)
CO2: 24 mmol/L (ref 22–32)
Chloride: 100 mmol/L — ABNORMAL LOW (ref 101–111)
Creatinine, Ser: 5.05 mg/dL — ABNORMAL HIGH (ref 0.44–1.00)
GFR calc Af Amer: 9 mL/min — ABNORMAL LOW (ref >60)
GFR calc non Af Amer: 8 mL/min — ABNORMAL LOW (ref >60)
GLUCOSE: 179 mg/dL — AB (ref 65–99)
PHOSPHORUS: 4.7 mg/dL — AB (ref 2.5–4.6)
POTASSIUM: 3.8 mmol/L (ref 3.5–5.1)
Sodium: 135 mmol/L (ref 135–145)

## 2016-10-19 LAB — HEPATIC FUNCTION PANEL
ALBUMIN: 3 g/dL — AB (ref 3.5–5.0)
ALK PHOS: 59 U/L (ref 38–126)
ALT: 14 U/L (ref 14–54)
AST: 14 U/L — ABNORMAL LOW (ref 15–41)
Bilirubin, Direct: <0.1 mg/dL — ABNORMAL LOW (ref 0.1–0.5)
TOTAL PROTEIN: 6.6 g/dL (ref 6.5–8.1)
Total Bilirubin: 0.4 mg/dL (ref 0.3–1.2)

## 2016-10-19 LAB — GLUCOSE, CAPILLARY
GLUCOSE-CAPILLARY: 147 mg/dL — AB (ref 65–99)
GLUCOSE-CAPILLARY: 144 mg/dL — AB (ref 65–99)
GLUCOSE-CAPILLARY: 154 mg/dL — AB (ref 65–99)
GLUCOSE-CAPILLARY: 167 mg/dL — AB (ref 65–99)

## 2016-10-19 LAB — CBC
HEMATOCRIT: 24.6 % — AB (ref 36.0–46.0)
HEMOGLOBIN: 7.7 g/dL — AB (ref 12.0–15.0)
MCH: 28.5 pg (ref 26.0–34.0)
MCHC: 31.3 g/dL (ref 30.0–36.0)
MCV: 91.1 fL (ref 78.0–100.0)
Platelets: 382 10*3/uL (ref 150–400)
RBC: 2.7 MIL/uL — AB (ref 3.87–5.11)
RDW: 14.3 % (ref 11.5–15.5)
WBC: 14.3 10*3/uL — AB (ref 4.0–10.5)

## 2016-10-19 LAB — LACTATE DEHYDROGENASE: LDH: 151 U/L (ref 98–192)

## 2016-10-19 NOTE — Progress Notes (Signed)
Reported to MD about patient's blood pressures and the difference in the readings between her R leg and R arm. He has ordered me to wait before I give her anything until he has a chance to see her. Will continue to monitor patient.

## 2016-10-19 NOTE — Care Management Note (Addendum)
Case Management Note  Patient Details  Name: Kelli [name] MRN: [id number] Date of Birth: 1950-12-05  Subjective/Objective:    Adm with symptomatic anemia, acute respiratory failure with hypoxia, CHF, acute on chronic Renal failure. Nephrology has been following. No recommendations for HD at this time. Patient is from home, sister is with her during the day, husband at night. Patient transfers to a WC but does not walk, hasn't in 2-3 years with patient. She states she would like to be able to walk again and would like [place] [facility]. [facility] refuses SNF rehab (has been in the past). Patient and sister are agreeable to home health RN and PT. Has had [facility] in the past and would like them again.          Action/Plan: Anticipate DC home with home health, possibly 10/20/2016 if BP stabilizes. CM will notify Vaughan Basta of [facility] when [facility] orders are available.  Patient currently wearing oxygen at 2L. No home oxygen PTA. CM following.   Expected Discharge Date:                  Expected Discharge Plan:  [place]  In-House Referral:     Discharge planning Services  CM Consult  Post Acute Care Choice:  Home Health Choice offered to:  Patient, Sibling  DME Arranged:    DME Agency:     HH Arranged:  RN, PT [place] Agency:  [place]  Status of Service:  Completed, signed off  If discussed at [place] of Stay Meetings, dates discussed:    Additional Comments:  Nels Munn, Chauncey Reading, RN 10/19/2016, 1:41 PM

## 2016-10-19 NOTE — Care Management Note (Signed)
Case Management Note  Patient Details  Name: Kelli [name] MRN: [id number] Date of Birth: Jul 20, 1950   If discussed at Long Length of Stay Meetings, dates discussed:   10/19/2016 Additional Comments:  Londyn Hotard, Chauncey Reading, RN 10/19/2016, 12:14 PM

## 2016-10-19 NOTE — Evaluation (Signed)
Physical Therapy Evaluation Patient Details Name: Kelli [name] MRN: [id number] DOB: 1950/10/24 Today's Date: 10/19/2016   History of Present Illness  Kelli [name] is a 66 y.o. female with medical history significant of CKD unknown baseline, HTN chronically poorly controlled, DM, obesity, prev CVA with left hemiparesis, PVD comes in with sob several times over the last couple of days with an episode of sscp that awoke her from sleep with associated diaphoresis.  She denies any fevers.  Pt reports she has been told by her PCP that her "kidneys are bad"  She has not seen a nephrologist and no one has mentioned dialysis to her before.  She says her bp normally runs in the 170-180 range at home.  She reports she does take her bp med daily.  She denies any wt gain but has chronic ble swelling and edema.  She denies any vomiting blood, melena or brbpr.  She denies any epigastric pain.  She did have a gib about 2 years ago which was due to ulcers and she needed a blood transfusion at that time.  Pt found to have hgb of 6, cr up to 4.5 and referred for admission for symptomatic anemia, chest pain and worsening renal failure.  She is having no pain at this time.  Her sbp was 210 on arrival now around 190.    Clinical Impression  Pt admitted with above diagnosis. Pt currently with functional limitations due to left sided weakness, fatigue, decreased activity tolerance and generalized pain all over.  Pt will benefit from skilled PT to increase their independence and safety with mobility to allow discharge to the venue listed below.      Follow Up Recommendations SNF    Equipment Recommendations  None recommended by PT    Recommendations for Other Services       Precautions / Restrictions Precautions Precautions: Fall Precaution Comments: left sided hemiparesis Restrictions Weight Bearing Restrictions: No      Mobility  Bed Mobility Overal bed mobility: Needs Assistance Bed Mobility: Sidelying to  Sit;Supine to Sit;Sit to Supine;Sit to Sidelying   Sidelying to sit: Max assist Supine to sit: Max assist Sit to supine: Max assist Sit to sidelying: Max assist General bed mobility comments: Has to transfer to the right (same at home)  Transfers Overall transfer level: Needs assistance Equipment used: 1 person hand held assist Transfers: Sit to/from [employer] Transfers Sit to Stand: Max assist Stand pivot transfers: Max assist       General transfer comment: patient demonstrated some use of LLE during transfers  Ambulation/Gait Ambulation/Gait assistance: Max assist Ambulation Distance (Feet): 3 Feet Assistive device: 1 person hand held assist          Stairs            Wheelchair Mobility    Modified Rankin (Stroke Patients Only)       Balance Overall balance assessment: Needs assistance Sitting-balance support: Feet supported Sitting balance-Leahy Scale: Fair     Standing balance support: Single extremity supported                                 Pertinent Vitals/Pain Pain Assessment: 0-10 Pain Score: 8  Pain Location: generalized all over body Pain Descriptors / Indicators: Discomfort;Constant Pain Intervention(s): Monitored during session;Limited activity within patient's tolerance    Home Living Family/patient expects to be discharged to:: Private residence Living Arrangements: Spouse/significant other;Children Available Help at  Discharge: Family Type of Home: House Home Access: [place]: [place]: [place] - 2 wheels;Bedside commode;Shower seat;Wheelchair - manual      Prior Function Level of Independence: Needs assistance   Gait / Transfers Assistance Needed: 1 person stand pivot with Min/mod assist           Hand Dominance        Extremity/Trunk Assessment   Upper Extremity Assessment Upper Extremity Assessment: RUE deficits/detail;LUE deficits/detail RUE Deficits  / Details: RUE grossly 3+/5, LUE 0/5 LUE Deficits / Details: flaccid LUE Sensation: decreased proprioception;decreased light touch LUE Coordination: decreased gross motor    Lower Extremity Assessment Lower Extremity Assessment: RLE deficits/detail;LLE deficits/detail RLE Deficits / Details: grossly 3+/5, LLE grossly -2/5 LLE Sensation: decreased proprioception;decreased light touch LLE Coordination: decreased gross motor    Cervical / Trunk Assessment Cervical / Trunk Assessment: Kyphotic  Communication   Communication: No difficulties  Cognition Arousal/Alertness: Awake/alert Behavior During Therapy: WFL for tasks assessed/performed Overall Cognitive Status: Within Functional Limits for tasks assessed                                        General Comments      Exercises General Exercises - Lower Extremity Ankle Circles/Pumps: 10 reps;Both Heel Slides: Both;10 reps   Assessment/Plan    PT Assessment Patient needs continued PT services  PT Problem List Decreased strength;Decreased mobility;Decreased coordination       PT Treatment Interventions Therapeutic activities;Therapeutic exercise;Neuromuscular re-education;Patient/family education;Functional mobility training    PT Goals (Current goals can be found in the Care Plan section)  Acute Rehab PT Goals Patient Stated Goal: Return home after rehab PT Goal Formulation: With patient/family Time For Goal Achievement: 11/23/16 Potential to Achieve Goals: Fair    Frequency Min 3X/week   Barriers to discharge        Co-evaluation               AM-PAC PT "6 Clicks" Daily Activity  Outcome Measure Difficulty turning over in bed (including adjusting bedclothes, sheets and blankets)?: A Lot Difficulty moving from lying on back to sitting on the side of the bed? : A Lot Difficulty sitting down on and standing up from a chair with arms (e.g., wheelchair, bedside commode, etc,.)?: A Lot Help  needed moving to and from a bed to chair (including a wheelchair)?: A Lot Help needed walking in hospital room?: Total Help needed climbing 3-5 steps with a railing? : Total 6 Click Score: 10    End of Session Equipment Utilized During Treatment: Gait belt Activity Tolerance: Patient limited by fatigue;Patient limited by pain Patient left: in bed;with call bell/phone within reach;with family/visitor present Nurse Communication: Mobility status PT Visit Diagnosis: Muscle weakness (generalized) (M62.81);Unsteadiness on feet (R26.81);Hemiplegia and hemiparesis Hemiplegia - Right/Left: Left Hemiplegia - dominant/non-dominant: Non-dominant Hemiplegia - caused by: Cerebral infarction    Time: 1610-9604 PT Time Calculation (min) (ACUTE ONLY): 45 min   Charges:   PT Evaluation $PT Eval Moderate Complexity: 1 Mod PT Treatments $Therapeutic Activity: 8-22 mins   PT G Codes:        2:06 PM, 2016/11/10 Lonell Grandchild, MPT Physical Therapist with [facility] 336 [phone] office [phone] mobile phone

## 2016-10-19 NOTE — Progress Notes (Addendum)
Diet regular healthy Subjective: Interval History: Patient complains of not feeling good. She feels weak. Denies any difficulty breathing..   Vital signs in last 24 hours: Temp:  [97.6 F (36.4 C)-99.1 F (37.3 C)] 99.1 F (37.3 C) (08/21 0519) Pulse Rate:  [85-89] 87 (08/21 0519) Resp:  [16-18] 16 (08/21 0519) BP: (109-213)/(66-79) 192/66 (08/21 0802) SpO2:  [92 %-97 %] 97 % (08/21 0716) Weight:  [87.3 kg (192 lb 7.4 oz)] 87.3 kg (192 lb 7.4 oz) (08/21 0519) Weight change: 0.5 kg (1 lb 1.6 oz)  Intake/Output from previous day: 08/20 0701 - 08/21 0700 In: 1913.3 [P.O.:1190; I.V.:723.3] Out: 2101 [Urine:2100; Emesis/NG output:1] Intake/Output this shift: No intake/output data recorded.  Generally: Patient is more alert today and answered questions by shaking her head. Some of the information is received from [place]. chest: Decreased breath sound otherwise seems to be clear Heart exam regular rate and rhythm no murmur Extremities: She has trace edema  Lab Results:  Recent Labs  10/18/16 1659  WBC 13.7*  HGB 7.8*  HCT 23.8*  PLT 345   BMET:   Recent Labs  10/17/16 0830 10/18/16 0653  NA 138 138  K 3.6 3.8  CL 101 103  CO2 24 23  GLUCOSE 207* 157*  BUN 51* 54*  CREATININE 4.62* 4.79*  CALCIUM 8.0* 8.0*   No results for input(s): PTH in the last 72 hours. Iron Studies:  No results for input(s): IRON, TIBC, TRANSFERRIN, FERRITIN in the last 72 hours.  Studies/Results: Dg Chest Port 1 View  Result Date: 10/18/2016 CLINICAL DATA:  Shortness of breath for several days. Congestive heart failure. EXAM: PORTABLE CHEST 1 VIEW COMPARISON:  Single-view of the chest 10/13/2016 and 10/16/2016. FINDINGS: Left subclavian central venous catheter remains in place with its tip in the proximal superior vena cava. There is cardiomegaly and mild interstitial edema. Small bilateral pleural effusions and mild atelectasis in the bases noted. Aeration appears slightly worse than on  the prior exam. IMPRESSION: Mild worsening of interstitial pulmonary edema with associated small bilateral pleural effusions and basilar atelectasis. Electronically Signed   By: Inge Rise M.D.   On: 10/18/2016 14:38   Dg Swallowing Func-speech Pathology  Result Date: 10/18/2016 Objective Swallowing Evaluation: Type of Study: MBS-Modified Barium Swallow Study Patient Details Name: Kelli [name] MRN: [id number] Date of Birth: 04-03-1950 Today's Date: 10/18/2016 Time: SLP Start Time (ACUTE ONLY): 1644-SLP Stop Time (ACUTE ONLY): 1701 SLP Time Calculation (min) (ACUTE ONLY): 17 min Past Medical History: Past Medical History: Diagnosis Date . Borderline diabetes  . Carotid artery occlusion   Occluded RICA, status post left CEA  August 2014 - Dr. Donnetta Hutching . Cerebral infarction [facility]) Aug 2014  Bihemispheric watershed infarcts . Cerebral infarction involving left cerebellar artery [facility]) Feb 2015 . CKD (chronic kidney disease) stage 3, GFR 30-59 ml/min  . Closed dislocation of left humerus 07/26/2013 . Essential hypertension, benign  . Fibromyalgia  . Mixed hyperlipidemia  . Multiple gastric ulcers  . Urinary incontinence  Past Surgical History: Past Surgical History: Procedure Laterality Date . COMBINED HYSTERECTOMY VAGINAL W/ MMK / A&P REPAIR  1981 . ENDARTERECTOMY Left 10/06/2012  Procedure: Carotid Endarterectomy with Finesse patch angioplasty;  Surgeon: [name], MD;  Location: [place];  Service: Vascular;  Laterality: Left; . ESOPHAGOGASTRODUODENOSCOPY N/A 04/26/2014  Procedure: ESOPHAGOGASTRODUODENOSCOPY (EGD);  Surgeon: Lear Ng, MD;  [facility] ENDOSCOPY;  Service: Endoscopy;  Laterality: N/A; . LOOP RECORDER IMPLANT  04/16/13  MDT LinQ implanted for cryptogenic stroke . [name]  WITHOUT CARDIOVERSION N/A 04/16/2013  Procedure: TRANSESOPHAGEAL ECHOCARDIOGRAM ([name]);  Surgeon: Josue Hector, MD;  Location: [facility] ENDOSCOPY;  Service: Cardiovascular;  Laterality: N/A; . URETHRAL DILATION  1980's . VAGINAL  HYSTERECTOMY  1981  "partial" (10/04/2012) HPI: Pt is a 66 year old female with a history of stroke with residual left hemiparesis, CKD stage III, essential hypertension, diabetes mellitus, peripheral vascular disease presented when she woke up with substernal chest discomfort in the early morning of 10/13/2016. 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 Consistent delay in swallow initiation exhibited across thin, nectar, and solid consistencies with reduced laryngeal closure resulting in penetration before and after the swallow. Pt cleared the penetrates from the laryngeal vestibule with a reflexive swallow and intermittent spontaneous throat clears. Chin tuck postural maneuver ineffective at preventing penetration. Aspiration evidenced with nectar thick liquid by teaspoon before the swallow which was not sensed. Pt displayed prolonged oral holding of nectar thick consistency and discoordination which was felt to negatively influence swallow safety. Mild vallecular residuals exhibited across PO consistencies secondary to reduced tongue base retraction with intemittent penetration after the swallow.  This was cleared from the laryngeal vestibule by additional reflexive swallows. Recommend thin liquids with regular consistencies and medicines whole in puree with full supervision with all PO to ensure safe swallow precautions. Aspiration risk remains increased in setting of CVA hx, reduced mobility, and decreased respiratory status. ST to follow up for diet tolerance and safe swallow education.    SLP Visit Diagnosis Dysphagia, oropharyngeal phase (R13.12) Attention and  concentration deficit following -- Frontal lobe and executive function deficit following -- Impact on safety and function Mild aspiration risk;Moderate aspiration risk   CHL IP TREATMENT RECOMMENDATION 10/18/2016 Treatment Recommendations Therapy as outlined in treatment plan below   Prognosis 10/18/2016 Prognosis for Safe Diet Advancement Good Barriers to Reach Goals -- Barriers/Prognosis Comment -- CHL IP DIET RECOMMENDATION 10/18/2016 SLP Diet Recommendations Thin liquid;Regular solids Liquid Administration via Cup;Straw Medication Administration Whole meds with puree Compensations Slow rate;Small sips/bites;Multiple dry swallows after each bite/sip Postural Changes Seated upright at 90 degrees;Remain semi-upright after after feeds/meals (Comment)   CHL IP OTHER RECOMMENDATIONS 10/18/2016 Recommended Consults -- Oral Care Recommendations Oral care BID Other Recommendations --   CHL IP FOLLOW UP RECOMMENDATIONS 10/18/2016 Follow up Recommendations 24 hour supervision/assistance   CHL IP FREQUENCY AND DURATION 10/18/2016 Speech Therapy Frequency (ACUTE ONLY) min 1 x/week Treatment Duration 2 weeks      CHL IP ORAL PHASE 10/18/2016 Oral Phase Impaired Oral - Pudding Teaspoon -- Oral - Pudding Cup -- Oral - Honey Teaspoon -- Oral - Honey Cup -- Oral - Nectar Teaspoon Holding of bolus;Delayed oral transit Oral - Nectar Cup NT Oral - Nectar Straw NT Oral - Thin Teaspoon Delayed oral transit;Piecemeal swallowing Oral - Thin Cup Delayed oral transit;Lingual/palatal residue;Piecemeal swallowing Oral - Thin Straw Delayed oral transit;Piecemeal swallowing Oral - Puree Piecemeal swallowing;Delayed oral transit Oral - Mech Soft NT Oral - Regular Delayed oral transit;Decreased bolus cohesion;Piecemeal swallowing Oral - Multi-Consistency NT Oral - Pill NT Oral Phase - Comment --  CHL IP PHARYNGEAL PHASE 10/18/2016 Pharyngeal Phase Impaired Pharyngeal- Pudding Teaspoon -- Pharyngeal -- Pharyngeal- Pudding Cup -- Pharyngeal --  Pharyngeal- Honey Teaspoon -- Pharyngeal --  Pharyngeal- Honey Cup -- Pharyngeal -- Pharyngeal- Nectar Teaspoon Penetration/Aspiration before swallow;Penetration/Apiration after swallow;Reduced airway/laryngeal closure;Pharyngeal residue - valleculae Pharyngeal Material enters airway, passes BELOW cords without attempt by patient to eject out (silent aspiration) Pharyngeal- Nectar Cup NT Pharyngeal -- Pharyngeal- Nectar Straw -- Pharyngeal -- Pharyngeal- Thin Teaspoon Reduced airway/laryngeal closure;Penetration/Aspiration before swallow;Penetration/Apiration after swallow;Penetration/Aspiration during swallow;Pharyngeal residue - valleculae;Reduced tongue base retraction Pharyngeal Material enters airway, CONTACTS cords and then ejected out;Material enters airway, CONTACTS cords and not ejected out Pharyngeal- Thin Cup Reduced airway/laryngeal closure;Reduced tongue base retraction;Pharyngeal residue - valleculae;Penetration/Aspiration before swallow;Penetration/Apiration after swallow Pharyngeal Material enters airway, CONTACTS cords and then ejected out Pharyngeal- Thin Straw Penetration/Apiration after swallow;Reduced tongue base retraction;Penetration/Aspiration before swallow;Pharyngeal residue - valleculae;Reduced airway/laryngeal closure Pharyngeal Material enters airway, remains ABOVE vocal cords then ejected out Pharyngeal- Puree Delayed swallow initiation-vallecula;Pharyngeal residue - valleculae;Reduced tongue base retraction Pharyngeal Material does not enter airway Pharyngeal- Mechanical Soft NT Pharyngeal -- Pharyngeal- Regular Delayed swallow initiation-vallecula;Reduced tongue base retraction;Pharyngeal residue - valleculae;Penetration/Aspiration before swallow;Reduced airway/laryngeal closure Pharyngeal Material enters airway, remains ABOVE vocal cords then ejected out Pharyngeal- Multi-consistency NT Pharyngeal -- Pharyngeal- Pill NT Pharyngeal -- Pharyngeal Comment --  CHL IP CERVICAL ESOPHAGEAL  PHASE 10/18/2016 Cervical Esophageal Phase WFL Pudding Teaspoon -- Pudding Cup -- Honey Teaspoon -- Honey Cup -- Nectar Teaspoon -- Nectar Cup -- Nectar Straw -- Thin Teaspoon -- Thin Cup -- Thin Straw -- Puree -- Mechanical Soft -- Regular -- Multi-consistency -- Pill -- Cervical Esophageal Comment -- No flowsheet data found. Arvil Chaco MA, CCC-SLP Acute Care Speech Language Pathologist  Levi Aland 10/18/2016, 5:28 PM               I have reviewed the patient's current medications.  Assessment/Plan: Problem #1 renal failure: Acute on chronic versus natural progression of her disease. At this moment superimposed prerenal syndrome versus ATN . Her creatinine came down to 4.79  yesterday.  Renal panel is pending for today. She still has adequate urine output. She is on Demadex. Problem #2 chronic renal failure: Stage III. Etiology could be ischemic/diabetes/hypertension/recurrent acute kidney injury/patient also with recurrent ureteral tract infection treated with different antibiotics. Patient was seen by urology at one time. Ultrasound of the kidney was not able to visualize left kidney. However ultrasound from 2014 showed left kidney to be 8.7 cm with the right one about 10.4. Problem #3 anemia: Her hemoglobin is below target goal but stable.  Problem #4 bone mineral disorder: His calcium and phosphorus is in range. Patient is not on a binder. Problem #5 history of CVA: Still no significant change. Patient is still remains weak and bedridden. Problem #6 history of cardiac erythema: She has previous history of loop recorder implant. Problem #7 nausea and poor appetite has improved according to her husband. No nausea or vomiting. Problem #8 difficulty breathing: she is on Demadex and patient is asymptomatic. Plan: We'll continue with hydration 2] patient does not require dialysis. 3]] the patient is going to be discharged to follow her blood work and make a decision about dialysis if renal  function deteriorates further or become symptomatic. 4] we'll check her renal panel in the morning.    LOS: 6 days   Mael Delap S 10/19/2016,8:20 AM

## 2016-10-19 NOTE — Care Management Important Message (Signed)
Important Message  Patient Details  Name: Kelli [name] MRN: [id number] Date of Birth: [birth year]   Medicare Important Message Given:  Yes    Carey Johndrow, Chauncey Reading, RN 10/19/2016, 1:40 PM

## 2016-10-19 NOTE — Progress Notes (Addendum)
PROGRESS NOTE  Kelli [name] ZOX:[id number] DOB: 06/10/1950 DOA: 10/13/2016 PCP: Manon Hilding, MD  Brief History: 66 year old female with a history of stroke with residual left hemiparesis, CKD stage III, essential hypertension, diabetes mellitus, peripheral vascular disease presented when she woke up with substernal chest discomfort in the early morning of 10/13/2016. The patient has some associated shortness of breath and diaphoresis. She denies any fevers, chills, headache, neck pain, dysuria, abdominal pain, hematochezia, melena. The patient states that she often feels nauseous when she first wakes up in the morning resulting in an episode of emesis. This has happened for many years. Upon presentation, the patient was noted to have hemoglobin of 6.3 and serum creatinine of 4.50. Chest x-ray showed central vascular congestion and interstitial edema. The patient was started on intravenous furosemide and admitted for further workup of her renal failure. Nephrology and cardiology were consulted to assist. On 10/18/2016, the patient was more short of breath than usual. Repeat chest x-ray showed worsened initially show edema. Additional furosemide was given IV in addition to her torsemide.  Assessment/Plan: Acute respiratory failure with hypoxia -due to CHF -stable on 2.5L-->97-98% -wean oxygen for saturation >92% -10/18/16--more dyspneic -8/20 CXR--personally reviewed--worsened interstitial edema; small bilateral pleural effusions -8/20--give additional furosemide 40 mg IV 1 -8/21--repeat CXR--pt states breathing not much better  Acute on chronic diastolic CHF -daily weights--NEG 8lbs for the admission--up one pound from 8/20 -accurate I/O's--not accurate -fluid restrict -10/14/16-Echo--EF 55-60%, grade 2 DD, no WMA. -torsemideand fluids per nephrology -cannot use ACEi due to renal failure -10/18/16--more dyspneic -8/20 CXR--personally reviewed--worsened interstitial edema;  small bilateral pleural effusions -8/20--give additional furosemide 40 mg IV 1  Atypical chest pain/Elevated troponin -appreciatecardiology -troponin trend flat -pt also has loop recorder placed Feb 2015 after stroke -elevated troponin due to decompensated CHF  Acute on chronic renal failure--CKD stage 3 -baseline creatinine 1.2-1.5 but no recent BMP -last serum creatinine 1.49 on 04/29/2014 -likely has progression of her underlying CKD with acute worsening possibly due to hemodynamic factors from CHF. -Appreciate nephrology follow-up -I Contacted the patient's PCP--no blood work since 2015 in office record -renal US--nonvisualization of the left kidney secondary to bowel gas.Right renal echogenicity without hydronephrosis. 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 Talbert Cage  Anemia of CKD--symptomatic anemia -transfused 2units PRBCs for the admssion -8/15 FOBT neg -10/13/16--Iron saturation 11%, ferritin 67, folic acid 8.7 -10/09/89 Hgb drop to 7.8 -repeat FOBT -check LDH, haptoglobin -am CBC -am labs still pending  Essential HTN-uncontrolled -continue metoprolol succinate -cannot use ACEi due to renal failure -continue amlodipine and hydralazine -increased hydralazine dose 50 mg q 8 hours -increase metoprolol succinate to 100 mg daily -suspect a degree of PVD in RUE--BP significantly more elevated than other places  -I personally took BP with large cuff 8/21--RUE-->170/83; LUE 156/79  Hx of Stroke with left hemiparesis  -continue ASA -check lipid panel--LDL57 -pt has loop recorder placed Feb 2015 -continue statin -PT eval--pt refuses SNF  Diabetes mellitus -d/c metformin altogether due to renal failure -8/17check A1C--5.7 -CBGs increased as pt eating  more -change to moderate scale SSI  Hyperlipidemia -continue statin  Leg edema and pain -venous duplex--neg  Hypokalemia -replete -check mag--2.1  Difficult IV access -consulted Dr. Arnoldo Morale for central line--placed 10/16/16    Disposition Plan: Home 8/22 or 8/23 if stable  Family Communication: sisterupdatedat bedside 8/21--Total time spent 35 minutes.  Greater than 50% spent face to face counseling and coordinating care.    Consultants: cardiology, nephrology  Code Status: FULL   DVT Prophylaxis: Ruth Heparin   Procedures: As Listed in Progress Note Above  Antibiotics: None   Subjective: Patient feels nauseous this morning. She states her breathing is not much better than yesterday. She still has some shortness of breath. She denies any headache, fevers, chills, chest pain, vomiting, diarrhea, dysuria, hematuria  O .bjective: Vitals:   10/19/16 0711 10/19/16 0716 10/19/16 0800 10/19/16 0802  BP: 109/79  111/74 (!) 192/66  Pulse:      Resp:      Temp:      TempSrc:      SpO2:  97%    Weight:      Height:        Intake/Output Summary (Last 24 hours) at 10/19/16 0930 Last data filed at 10/19/16 0615  Gross per 24 hour  Intake          1863.34 ml  Output             2101 ml  Net          -237.66 ml   Weight change: 0.5 kg (1 lb 1.6 oz) Exam:   General:  Pt is alert, follows commands appropriately, not in acute distress  HEENT: No icterus, No thrush, No neck mass, [place]/AT  Cardiovascular: RRR, S1/S2, no rubs, no gallops  Respiratory: Bibasilar crackles. Bibasilar expiratory wheeze.   Abdomen: Soft/+BS, non tender, non distended, no guarding  Extremities: 1 + LLE edema, No lymphangitis, No petechiae, No rashes, no synovitis   Data Reviewed: I have personally reviewed following labs and imaging studies Basic Metabolic Panel:  Recent Labs Lab 10/14/16 0611 10/15/16 0624 10/16/16 1341 10/17/16 0830 10/18/16 0653  NA 137  137 135 138 138  K 3.5 3.3* 3.5 3.6 3.8  CL 106 103 101 101 103  CO2 20* 21* 23 24 23   GLUCOSE 170* 171* 255* 207* 157*  BUN 41* 47* 50* 51* 54*  CREATININE 4.51* 4.61* 4.84* 4.62* 4.79*  CALCIUM 7.8*  7.6* 7.6* 7.4* 8.0* 8.0*  MG 1.6*  --  1.5* 2.1  --   PHOS 5.9* 5.6* 4.7* 5.4* 5.2*   Liver Function Tests:  Recent Labs Lab 10/13/16 0352 10/14/16 0611 10/15/16 0624 10/16/16 1341 10/17/16 0830 10/18/16 0653  AST 13*  --   --   --   --   --   ALT 11*  --   --   --   --   --   ALKPHOS 68  --   --   --   --   --   BILITOT 0.4  --   --   --   --   --   PROT 6.9  --   --   --   --   --   ALBUMIN 3.3* 3.1* 3.1* 3.0* 3.3* 3.0*   No results for input(s): LIPASE, AMYLASE in the last 168 hours. No results for input(s): AMMONIA in the last 168 hours. Coagulation Profile: No results for input(s): INR, PROTIME in the last 168 hours. CBC:  Recent Labs Lab 10/13/16 0352 10/13/16 0551 10/13/16 1951 10/14/16 0611 10/15/16 0624 10/18/16 1659  WBC 13.8* 16.5*  --  16.7* 13.4* 13.7*  NEUTROABS 11.5*  --   --   --   --   --   HGB 6.3* 6.9* 9.1* 9.3*  9.6* 7.8*  HCT 19.2* 20.8* 26.7* 26.9* 28.2* 23.8*  MCV 85.3 86.0  --  85.4 86.0 88.1  PLT 299 376  --  332 331 345   Cardiac Enzymes:  Recent Labs Lab 10/13/16 0352 10/13/16 0551 10/13/16 0659 10/13/16 1156 10/13/16 1951  CKTOTAL  --  20*  --   --   --   TROPONINI 0.06*  --  0.06* 0.06* 0.05*   BNP: Invalid input(s): POCBNP CBG:  Recent Labs Lab 10/18/16 0754 10/18/16 1123 10/18/16 1712 10/18/16 2033 10/19/16 0801  GLUCAP 161* 246* 157* 180* 147*   HbA1C: No results for input(s): HGBA1C in the last 72 hours. Urine analysis:    Component Value Date/Time   COLORURINE YELLOW 10/13/2016 0640   APPEARANCEUR HAZY (A) 10/13/2016 0640   LABSPEC 1.009 10/13/2016 0640   PHURINE 5.0 10/13/2016 0640   GLUCOSEU 50 (A) 10/13/2016 0640   HGBUR NEGATIVE 10/13/2016 0640   BILIRUBINUR NEGATIVE 10/13/2016 0640   KETONESUR  NEGATIVE 10/13/2016 0640   PROTEINUR 100 (A) 10/13/2016 0640   UROBILINOGEN 0.2 04/25/2014 2315   NITRITE NEGATIVE 10/13/2016 0640   LEUKOCYTESUR LARGE (A) 10/13/2016 0640   Sepsis Labs: @LABRCNTIP (procalcitonin:4,lacticidven:4) )No results found for this or any previous visit (from the past 240 hour(s)).   Scheduled Meds: . albuterol  2.5 mg Nebulization Q12H  . amLODipine  10 mg Oral Daily  . aspirin  81 mg Oral Daily  . atorvastatin  40 mg Oral q1800  . calcitRIOL  0.25 mcg Oral Daily  . hydrALAZINE  50 mg Oral Q8H  . insulin aspart  0-9 Units Subcutaneous TID WC  . metoprolol succinate  100 mg Oral Daily  . nitroGLYCERIN  1 inch Topical Q8H  . pantoprazole  40 mg Oral Q0600  . potassium chloride  40 mEq Oral Daily  . sodium bicarbonate  650 mg Oral TID  . sodium chloride flush  10-40 mL Intracatheter Q12H  . sodium chloride flush  3 mL Intravenous Q12H  . torsemide  60 mg Oral Daily   Continuous Infusions: . sodium chloride 250 mL (10/18/16 1911)  . sodium chloride 10 mL/hr at 10/18/16 7035    Procedures/Studies: US Renal  Result Date: 10/13/2016 CLINICAL DATA:  Acute on chronic renal failure EXAM: RENAL / URINARY TRACT ULTRASOUND COMPLETE COMPARISON:  Renal ultrasound of January 17, 2013. FINDINGS: Right Kidney: Length: 11.4 cm. The renal cortical echotexture is increased and is approximately equal to that of the liver. There is diffuse cortical thinning. There is no hydronephrosis. Left Kidney: Length: Bowel gas obscures the left kidney. Bladder: Appears normal for degree of bladder distention. IMPRESSION: Nonvisualization of the left kidney likely secondary to increased bowel gas on the left. Cortical thinning and increased renal cortical echotexture the right kidney is consistent with medical renal disease. No hydronephrosis is observed. Electronically Signed   By: Laquana Villari  [place] M.D.   On: 10/13/2016 15:41   US Venous Img Lower Bilateral  Result Date:  10/13/2016 CLINICAL DATA:  Bilateral lower extremity pain and swelling, initial encounter EXAM: BILATERAL LOWER EXTREMITY VENOUS DOPPLER ULTRASOUND TECHNIQUE: Gray-scale sonography with graded compression, as well as color Doppler and duplex ultrasound were performed to evaluate the lower extremity deep venous systems from the level of the common femoral vein and including the common femoral, femoral, profunda femoral, popliteal and calf veins including the posterior tibial, peroneal and gastrocnemius veins when visible. The superficial great saphenous vein was also interrogated. Spectral Doppler was utilized to evaluate flow at rest and  with distal augmentation maneuvers in the common femoral, femoral and popliteal veins. COMPARISON:  None. FINDINGS: RIGHT LOWER EXTREMITY Common Femoral Vein: No evidence of thrombus. Normal compressibility, respiratory phasicity and response to augmentation. Saphenofemoral Junction: No evidence of thrombus. Normal compressibility and flow on color Doppler imaging. Profunda Femoral Vein: No evidence of thrombus. Normal compressibility and flow on color Doppler imaging. Femoral Vein: No evidence of thrombus. Normal compressibility, respiratory phasicity and response to augmentation. Popliteal Vein: No evidence of thrombus. Normal compressibility, respiratory phasicity and response to augmentation. Calf Veins: No evidence of thrombus. Normal compressibility and flow on color Doppler imaging. Superficial Great Saphenous Vein: No evidence of thrombus. Normal compressibility and flow on color Doppler imaging. Venous Reflux:  None. Other Findings:  Mild calf edema is noted. LEFT LOWER EXTREMITY Common Femoral Vein: No evidence of thrombus. Normal compressibility, respiratory phasicity and response to augmentation. Saphenofemoral Junction: No evidence of thrombus. Normal compressibility and flow on color Doppler imaging. Profunda Femoral Vein: No evidence of thrombus. Normal  compressibility and flow on color Doppler imaging. Femoral Vein: No evidence of thrombus. Normal compressibility, respiratory phasicity and response to augmentation. Popliteal Vein: No evidence of thrombus. Normal compressibility, respiratory phasicity and response to augmentation. Calf Veins: No evidence of thrombus. Normal compressibility and flow on color Doppler imaging. Superficial Great Saphenous Vein: No evidence of thrombus. Normal compressibility and flow on color Doppler imaging. Venous Reflux:  None. Other Findings:  Mild calf edema is noted. IMPRESSION: No evidence of DVT within either lower extremity. Electronically Signed   By: Inez Catalina M.D.   On: 10/13/2016 15:47   Dg Chest Port 1 View  Result Date: 10/18/2016 CLINICAL DATA:  Shortness of breath for several days. Congestive heart failure. EXAM: PORTABLE CHEST 1 VIEW COMPARISON:  Single-view of the chest 10/13/2016 and 10/16/2016. FINDINGS: Left subclavian central venous catheter remains in place with its tip in the proximal superior vena cava. There is cardiomegaly and mild interstitial edema. Small bilateral pleural effusions and mild atelectasis in the bases noted. Aeration appears slightly worse than on the prior exam. IMPRESSION: Mild worsening of interstitial pulmonary edema with associated small bilateral pleural effusions and basilar atelectasis. Electronically Signed   By: Inge Rise M.D.   On: 10/18/2016 14:38   Dg Chest Port 1 View  Result Date: 10/16/2016 CLINICAL DATA:  Central line placement. EXAM: PORTABLE CHEST 1 VIEW COMPARISON:  October 13, 2016 FINDINGS: A new left subclavian line is identified terminating in the SVC. No pneumothorax. Mild pulmonary venous congestion. Mild opacity in the right base is likely atelectasis. Cardiomegaly. No other abnormalities or changes. IMPRESSION: Cardiomegaly.  Pulmonary venous congestion. New left central line terminating in the SVC.  No pneumothorax. Electronically Signed   By:  Dorise Bullion III M.D   On: 10/16/2016 13:11   Dg Chest Port 1 View  Result Date: 10/13/2016 CLINICAL DATA:  Shortness of breath and chest tightness EXAM: PORTABLE CHEST 1 VIEW COMPARISON:  04/26/2014 FINDINGS: Moderate cardiomegaly with globular cardiac configuration, increased compared to prior radiograph. Central vascular congestion and mild interstitial perihilar edema. No pleural effusion. Aortic atherosclerosis. No pneumothorax. Electronic device over the left chest. IMPRESSION: Moderate cardiomegaly with globular cardiac configuration, findings could be secondary to multi chamber enlargement or pericardial effusion. There is central vascular congestion and mild perihilar edema. Electronically Signed   By: Donavan Foil M.D.   On: 10/13/2016 03:25   Dg Swallowing Func-speech Pathology  Result Date: 10/18/2016 Objective Swallowing Evaluation: Type of Study: MBS-Modified Barium Swallow  Study Patient Details Name: JHORDAN KINTER MRN: [id number] Date of Birth: Jan 04, 1951 Today's Date: 10/18/2016 Time: SLP Start Time (ACUTE ONLY): 1644-SLP Stop Time (ACUTE ONLY): 1701 SLP Time Calculation (min) (ACUTE ONLY): 17 min Past Medical History: Past Medical History: Diagnosis Date . Borderline diabetes  . Carotid artery occlusion   Occluded RICA, status post left CEA  August 2014 - Dr. Donnetta Hutching . Cerebral infarction [facility]) Aug 2014  Bihemispheric watershed infarcts . Cerebral infarction involving left cerebellar artery [facility]) Feb 2015 . CKD (chronic kidney disease) stage 3, GFR 30-59 ml/min  . Closed dislocation of left humerus 07/26/2013 . Essential hypertension, benign  . Fibromyalgia  . Mixed hyperlipidemia  . Multiple gastric ulcers  . Urinary incontinence  Past Surgical History: Past Surgical History: Procedure Laterality Date . COMBINED HYSTERECTOMY VAGINAL W/ MMK / A&P REPAIR  1981 . ENDARTERECTOMY Left 10/06/2012  Procedure: Carotid Endarterectomy with Finesse patch angioplasty;  Surgeon: [name], MD;  Location:  [place];  Service: Vascular;  Laterality: Left; . ESOPHAGOGASTRODUODENOSCOPY N/A 04/26/2014  Procedure: ESOPHAGOGASTRODUODENOSCOPY (EGD);  Surgeon: Lear Ng, MD;  [facility] ENDOSCOPY;  Service: Endoscopy;  Laterality: N/A; . LOOP RECORDER IMPLANT  04/16/13  MDT LinQ implanted for cryptogenic stroke . [name] WITHOUT CARDIOVERSION N/A 04/16/2013  Procedure: TRANSESOPHAGEAL ECHOCARDIOGRAM ([name]);  Surgeon: Josue Hector, MD;  Location: [facility] ENDOSCOPY;  Service: Cardiovascular;  Laterality: N/A; . URETHRAL DILATION  1980's . VAGINAL HYSTERECTOMY  1981  "partial" (10/04/2012) HPI: Pt is a 66 year old female with a history of stroke with residual left hemiparesis, CKD stage III, essential hypertension, diabetes mellitus, peripheral vascular disease presented when she woke up with substernal chest discomfort in the early morning of 10/13/2016. 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 Consistent delay in swallow initiation exhibited across thin, nectar, and solid consistencies with reduced laryngeal closure resulting in penetration before and after the swallow. Pt cleared the penetrates from the laryngeal vestibule with a reflexive swallow and intermittent spontaneous throat clears. Chin tuck postural maneuver ineffective at preventing penetration. Aspiration evidenced with nectar thick liquid by teaspoon before the swallow which was not sensed. Pt displayed prolonged oral holding of nectar thick consistency and discoordination which was felt to negatively influence swallow safety. Mild vallecular residuals exhibited across PO consistencies secondary to reduced tongue base  retraction with intemittent penetration after the swallow.  This was cleared from the laryngeal vestibule by additional reflexive swallows. Recommend thin liquids with regular consistencies and medicines whole in puree with full supervision with all PO to ensure safe swallow precautions. Aspiration risk remains increased in setting of CVA hx, reduced mobility, and decreased respiratory status. ST to follow up for diet tolerance and safe swallow education.    SLP Visit Diagnosis Dysphagia, oropharyngeal phase (R13.12) Attention and concentration deficit following -- Frontal lobe and executive function deficit following -- Impact on safety and function Mild aspiration risk;Moderate aspiration risk   CHL IP TREATMENT RECOMMENDATION 10/18/2016 Treatment Recommendations Therapy as outlined in treatment plan below   Prognosis 10/18/2016 Prognosis for Safe Diet Advancement Good Barriers to Reach Goals -- Barriers/Prognosis Comment -- CHL IP DIET RECOMMENDATION 10/18/2016 SLP Diet Recommendations Thin liquid;Regular solids Liquid Administration via Cup;Straw Medication Administration Whole meds with puree  Compensations Slow rate;Small sips/bites;Multiple dry swallows after each bite/sip Postural Changes Seated upright at 90 degrees;Remain semi-upright after after feeds/meals (Comment)   CHL IP OTHER RECOMMENDATIONS 10/18/2016 Recommended Consults -- Oral Care Recommendations Oral care BID Other Recommendations --   CHL IP FOLLOW UP RECOMMENDATIONS 10/18/2016 Follow up Recommendations 24 hour supervision/assistance   CHL IP FREQUENCY AND DURATION 10/18/2016 Speech Therapy Frequency (ACUTE ONLY) min 1 x/week Treatment Duration 2 weeks      CHL IP ORAL PHASE 10/18/2016 Oral Phase Impaired Oral - Pudding Teaspoon -- Oral - Pudding Cup -- Oral - Honey Teaspoon -- Oral - Honey Cup -- Oral - Nectar Teaspoon Holding of bolus;Delayed oral transit Oral - Nectar Cup NT Oral - Nectar Straw NT Oral - Thin Teaspoon Delayed oral  transit;Piecemeal swallowing Oral - Thin Cup Delayed oral transit;Lingual/palatal residue;Piecemeal swallowing Oral - Thin Straw Delayed oral transit;Piecemeal swallowing Oral - Puree Piecemeal swallowing;Delayed oral transit Oral - Mech Soft NT Oral - Regular Delayed oral transit;Decreased bolus cohesion;Piecemeal swallowing Oral - Multi-Consistency NT Oral - Pill NT Oral Phase - Comment --  CHL IP PHARYNGEAL PHASE 10/18/2016 Pharyngeal Phase Impaired Pharyngeal- Pudding Teaspoon -- Pharyngeal -- Pharyngeal- Pudding Cup -- Pharyngeal -- Pharyngeal- Honey Teaspoon -- Pharyngeal -- Pharyngeal- Honey Cup -- Pharyngeal -- Pharyngeal- Nectar Teaspoon Penetration/Aspiration before swallow;Penetration/Apiration after swallow;Reduced airway/laryngeal closure;Pharyngeal residue - valleculae Pharyngeal Material enters airway, passes BELOW cords without attempt by patient to eject out (silent aspiration) Pharyngeal- Nectar Cup NT Pharyngeal -- Pharyngeal- Nectar Straw -- Pharyngeal -- Pharyngeal- Thin Teaspoon Reduced airway/laryngeal closure;Penetration/Aspiration before swallow;Penetration/Apiration after swallow;Penetration/Aspiration during swallow;Pharyngeal residue - valleculae;Reduced tongue base retraction Pharyngeal Material enters airway, CONTACTS cords and then ejected out;Material enters airway, CONTACTS cords and not ejected out Pharyngeal- Thin Cup Reduced airway/laryngeal closure;Reduced tongue base retraction;Pharyngeal residue - valleculae;Penetration/Aspiration before swallow;Penetration/Apiration after swallow Pharyngeal Material enters airway, CONTACTS cords and then ejected out Pharyngeal- Thin Straw Penetration/Apiration after swallow;Reduced tongue base retraction;Penetration/Aspiration before swallow;Pharyngeal residue - valleculae;Reduced airway/laryngeal closure Pharyngeal Material enters airway, remains ABOVE vocal cords then ejected out Pharyngeal- Puree Delayed swallow  initiation-vallecula;Pharyngeal residue - valleculae;Reduced tongue base retraction Pharyngeal Material does not enter airway Pharyngeal- Mechanical Soft NT Pharyngeal -- Pharyngeal- Regular Delayed swallow initiation-vallecula;Reduced tongue base retraction;Pharyngeal residue - valleculae;Penetration/Aspiration before swallow;Reduced airway/laryngeal closure Pharyngeal Material enters airway, remains ABOVE vocal cords then ejected out Pharyngeal- Multi-consistency NT Pharyngeal -- Pharyngeal- Pill NT Pharyngeal -- Pharyngeal Comment --  CHL IP CERVICAL ESOPHAGEAL PHASE 10/18/2016 Cervical Esophageal Phase WFL Pudding Teaspoon -- Pudding Cup -- Honey Teaspoon -- Honey Cup -- Nectar Teaspoon -- Nectar Cup -- Nectar Straw -- Thin Teaspoon -- Thin Cup -- Thin Straw -- Puree -- Mechanical Soft -- Regular -- Multi-consistency -- Pill -- Cervical Esophageal Comment -- No flowsheet data found. Arvil Chaco MA, CCC-SLP Acute Care Speech Language Pathologist  Levi Aland 10/18/2016, 5:28 PM               Arisha Gervais, DO  [facility] Pager [phone]  If 7PM-7AM, please contact night-coverage [email or web address] Password [email or web address] 10/19/2016, 9:30 AM   LOS: 6 days

## 2016-10-20 LAB — GLUCOSE, CAPILLARY
GLUCOSE-CAPILLARY: 148 mg/dL — AB (ref 65–99)
GLUCOSE-CAPILLARY: 153 mg/dL — AB (ref 65–99)
GLUCOSE-CAPILLARY: 139 mg/dL — AB (ref 65–99)

## 2016-10-20 LAB — BASIC METABOLIC PANEL
Anion gap: 15 (ref 5–15)
BUN: 64 mg/dL — ABNORMAL HIGH (ref 6–20)
CO2: 20 mmol/L — ABNORMAL LOW (ref 22–32)
Calcium: 8.2 mg/dL — ABNORMAL LOW (ref 8.9–10.3)
Chloride: 103 mmol/L (ref 101–111)
Creatinine, Ser: 4.85 mg/dL — ABNORMAL HIGH (ref 0.44–1.00)
GFR calc Af Amer: 10 mL/min — ABNORMAL LOW (ref >60)
GFR calc non Af Amer: 9 mL/min — ABNORMAL LOW (ref >60)
Glucose, Bld: 136 mg/dL — ABNORMAL HIGH (ref 65–99)
Potassium: 4.3 mmol/L (ref 3.5–5.1)
Sodium: 138 mmol/L (ref 135–145)

## 2016-10-20 LAB — CBC
HCT: 24.3 % — ABNORMAL LOW (ref 36.0–46.0)
Hemoglobin: 7.7 g/dL — ABNORMAL LOW (ref 12.0–15.0)
MCH: 28.4 pg (ref 26.0–34.0)
MCHC: 31.7 g/dL (ref 30.0–36.0)
MCV: 89.7 fL (ref 78.0–100.0)
Platelets: 325 10*3/uL (ref 150–400)
RBC: 2.71 MIL/uL — ABNORMAL LOW (ref 3.87–5.11)
RDW: 14.8 % (ref 11.5–15.5)
WBC: 11.9 10*3/uL — ABNORMAL HIGH (ref 4.0–10.5)

## 2016-10-20 LAB — HAPTOGLOBIN: HAPTOGLOBIN: 307 mg/dL — AB (ref 34–200)

## 2016-10-20 LAB — PROCALCITONIN: PROCALCITONIN: 0.23 ng/mL

## 2016-10-20 MED ORDER — HYDRALAZINE HCL 50 MG PO TABS: 50.0000 mg | ORAL_TABLET | Freq: Three times a day (TID) | ORAL | 0 refills | Status: DC

## 2016-10-20 MED ORDER — ATORVASTATIN CALCIUM 40 MG PO TABS: 40.0000 mg | ORAL_TABLET | Freq: Every day | ORAL | 0 refills | Status: DC

## 2016-10-20 MED ORDER — METOPROLOL SUCCINATE ER 100 MG PO TB24: 100.0000 mg | ORAL_TABLET | Freq: Every day | ORAL | 0 refills | Status: DC

## 2016-10-20 MED ORDER — TORSEMIDE 20 MG PO TABS: 40.0000 mg | ORAL_TABLET | Freq: Every day | ORAL | 0 refills | Status: DC

## 2016-10-20 MED ORDER — CALCITRIOL 0.25 MCG PO CAPS: 0.2500 ug | ORAL_CAPSULE | Freq: Every day | ORAL | 0 refills | Status: DC

## 2016-10-20 MED ORDER — SODIUM BICARBONATE 650 MG PO TABS: 650.0000 mg | ORAL_TABLET | Freq: Three times a day (TID) | ORAL | 0 refills | Status: DC

## 2016-10-20 MED ORDER — POTASSIUM CHLORIDE CRYS ER 20 MEQ PO TBCR: 20.0000 meq | EXTENDED_RELEASE_TABLET | Freq: Every day | ORAL | 0 refills | Status: DC

## 2016-10-20 NOTE — Care Management Note (Signed)
Case Management Note  Patient Details  Name: Kelli [name] MRN: [id number] Date of Birth: [birth year]     Expected Discharge Date:  10/20/16               Expected Discharge Plan:  [place]  In-House Referral:     Discharge planning Services  CM Consult  Post Acute Care Choice:  Home Health Choice offered to:  Patient, Sibling  DME Arranged:    DME Agency:     HH Arranged:  RN, PT [place] Agency:  [place]  Status of Service:  Completed, signed off  If discussed at [place] of Stay Meetings, dates discussed:    Additional Comments: Patient discharging home today. Vaughan Basta of [facility] notified and will obtain Victor orders from chart.   Revecca Nachtigal, Chauncey [place], RN 10/20/2016, 4:37 PM

## 2016-10-20 NOTE — Progress Notes (Signed)
CCMD reports pt. Had 10 beat run of V Tach. MD notified.

## 2016-10-20 NOTE — Progress Notes (Signed)
Kelli [name]  MRN: [id number]  DOB/AGE: September 08, 1950 66 y.o.  Primary Care Physician:[name], Kelli Moment, MD  Admit date: 10/13/2016  Chief Complaint:  Chief Complaint  Patient presents with  . Shortness of Breath    S-Pt presented on  10/13/2016 with  Chief Complaint  Patient presents with  . Shortness of Breath  .    Pt offers no new complaints. Pt says " I think I have no option, I will need dialysis"    Pt father was on Hd-Pt does understand the issues regarding to dialysis.    Meds . albuterol  2.5 mg Nebulization Q12H  . amLODipine  10 mg Oral Daily  . aspirin  81 mg Oral Daily  . atorvastatin  40 mg Oral q1800  . calcitRIOL  0.25 mcg Oral Daily  . hydrALAZINE  50 mg Oral Q8H  . insulin aspart  0-9 Units Subcutaneous TID WC  . metoprolol succinate  100 mg Oral Daily  . nitroGLYCERIN  1 inch Topical Q8H  . pantoprazole  40 mg Oral Q0600  . potassium chloride  40 mEq Oral Daily  . sodium bicarbonate  650 mg Oral TID  . sodium chloride flush  10-40 mL Intracatheter Q12H  . sodium chloride flush  3 mL Intravenous Q12H  . torsemide  60 mg Oral Daily      Physical Exam: Vital signs in last 24 hours: Temp:  [98.2 F (36.8 C)-98.6 F (37 C)] 98.2 F (36.8 C) (08/22 0423) Pulse Rate:  [86-89] 86 (08/22 0423) Resp:  [16-20] 16 (08/22 0423) BP: (146-161)/(71-75) 161/75 (08/22 0423) SpO2:  [97 %-98 %] 97 % (08/22 0719) Weight:  [192 lb 3.9 oz (87.2 kg)] 192 lb 3.9 oz (87.2 kg) (08/22 0417) Weight change: -3.5 oz (-0.1 kg) Last BM Date: 10/17/16  Intake/Output from previous day: 08/21 0701 - 08/22 0700 In: 308.8 [I.V.:308.8] Out: 1000 [Urine:1000] No intake/output data recorded.   Physical Exam: General- pt is awake,alert, oriented to time place and person Resp- No acute REsp distress, Rhonchi+ CVS- S1S2 regular in rate and rhythm GIT- BS+, soft, NT, ND EXT- trace LE Edema, no Cyanosis   Lab Results: CBC  Recent Labs  10/18/16 1659 10/19/16 1105  WBC  13.7* 14.3*  HGB 7.8* 7.7*  HCT 23.8* 24.6*  PLT 345 382    BMET  Recent Labs  10/18/16 0653 10/19/16 1105  NA 138 135  K 3.8 3.8  CL 103 100*  CO2 23 24  GLUCOSE 157* 179*  BUN 54* 58*  CREATININE 4.79* 5.05*  CALCIUM 8.0* 8.0*   Creat trend 2018   4.5--5.0 2016   1.4--1.5 2015   1.3--1.7 2014   1.1--2.0 ( AKi)    Lab Results  Component Value Date   PTH 162 (H) 10/14/2016   PTH Comment 10/14/2016   CALCIUM 8.0 (L) 10/19/2016   CAION 1.14 04/08/2013   PHOS 4.7 (H) 10/19/2016     ANA Positive Anti dsDNA negative Complements normal ANCA negative M spike negative Free kappa/lamda chain ratio- 2.08    CXr done  10/19/16 1. No interval change. 2. Cardiomegaly, central venous congestion, and pleural effusions .    Impression: 1)Renal   AKI secondary to ATN vs CKD progression               CKD stage 5 .               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  3)Anemia HGb not at goal (9--11) Received prbc during this admision.  4)CKD Mineral-Bone Disorder PTH elevated. Secondary Hyperparathyroidism- present.    On calcitriol Phosphorus near to  goal.   5)DM PMD following  6)Electrolytes Normokalemic NOrmonatremic   7)Acid base Co2 at goal On po bicarb    Plan:  Will ask for chem 7 and cbc. CXr done yesterday does not show m,uch fluid overload Will plan for need of HD after the labs .  I discuss the pt issues with pt and her sister.     Kelli [name] S 10/20/2016, 9:21 AM

## 2016-10-20 NOTE — Discharge Instructions (Signed)
Chronic Kidney Disease, Adult Chronic kidney disease (CKD) occurs when the kidneys are damaged during a period of 3 or more months. The kidneys are two organs that do many important jobs in the body, which include:  Removing wastes and extra fluids from the blood.  Making hormones that maintain the amount of fluid in your tissues and blood vessels.  Maintaining the right amount of fluids and chemicals in the body.  A small amount of kidney damage may not cause problems, but a large amount of damage may make it difficult or impossible for the kidneys to work the way they should. If steps are not taken to slow down the kidney damage or stop it from getting worse, the kidneys may stop working permanently (end stage kidney disease). Most of the time, CKD does not go away, but it can often be controlled. People who have CKD can usually live normal lives. What are the causes? The most common causes of this condition are diabetes and high blood pressure (hypertension). Other causes include:  Heart and blood vessel (cardiovascular) disease.  Kidney diseases: ? Glomerulonephritis. ? Interstitial nephritis. ? Polycystic kidney disease. ? Renal vascular disease.  Diseases that affect the immune system.  Genetic diseases.  Medicines that damage the kidneys, such as anti-inflammatory medicines.  Poisoning.  Being around or in contact with poisonous (toxic) substances.  A kidney or urinary infection that occurs again (recurs).  Vasculitis.  Repeat kidney infections.  A problem with urine flow that may be caused by: ? Cancer. ? Having kidney stones more than one time. ? An enlarged prostate in males.  What increases the risk? This condition is more likely to develop in people who are:  Older than age 48.  Female.  Of African-American descent.  Current smokers or former smokers.  Obese.  You may also have an increased risk for CKD if you have a family history of CKDor  youfrequently take medicines that are damaging to the kidneys. What are the signs or symptoms? Symptoms develop slowly and may not be obvious until the kidney damage becomes severe. It is possible to have a kidney disease for years without showing any symptoms. Symptoms of this condition can include:  Swelling (edema) of the face, legs, ankles, or feet.  Numbness, tingling, or loss of feeling (sensation) in the hands or feet.  Tiredness (lethargy).  Nausea or vomiting.  Confusion or trouble concentrating.  Problems with urination, such as: ? Painful or burning feeling during urination. ? Decreased urine production. ? Frequent urination, especially at night. ? Bloody urine.  Muscle twitches and cramps, especially in the legs.  Shortness of breath.  Weakness.  Constant itchiness.  Loss of appetite.  Metallic taste in the mouth.  Trouble sleeping.  Pale lining of the eyelids and surface of the eye (conjunctiva).  How is this diagnosed? This condition may be diagnosed with various tests. Tests may include:  Blood tests.  Urine tests.  Imaging tests.  A test in which a sample of tissue is removed from the kidneys to be looked at under a microscope (kidney biopsy).  These test results will help your health care provider determine what class of CKD you have. How is this treated? Most cases of CKD cannot be cured. Treatment usually involves relieving symptoms and preventing or slowing the progression of the disease. Treatment may include:  A special diet, which may require you to avoid alcohol, salty foods (sodium), and foods that are high in potassium, calcium, and protein.  Medicines: ? To lower blood pressure. ? To relieve low blood count (anemia). ? To relieve swelling. ? To protect your bones. ? To improve the balance of electrolytes in your blood.  Removing toxic waste from the body by using hemodialysis or peritoneal dialysis if the kidneys can no longer do  their job (kidney failure).  Management of other conditions that are causing your CKD or making it worse.  Follow these instructions at home:  Follow your prescribed diet.  Take over-the-counter and prescription medicines only as told by your health care provider. ? Do not take any new medicines unless approved by your health care provider. Many medicines can worsen your kidney damage. ? Do not take any vitamin and mineral supplements unless approved by your health care provider. Many nutritional supplements can worsen your kidney damage. ? The dose of some medicines that you take may need to be adjusted.  Do not use any tobacco products, such as cigarettes, chewing tobacco, and e-cigarettes. If you need help quitting, ask your health care provider.  Keep all follow-up visits as told by your health care provider. This is important.  Keep track of your blood pressure. Report changes in your blood pressure as told by your health care provider.  Achieve and maintain a healthy weight. If you need help with this, ask your health care provider.  Start or continue an exercise plan. Try to exercise at least 30 minutes a day, 5 days a week.  Stay current with immunizations as told by your health care provider. Where to find more information:  American Association of Kidney Patients: BombTimer.gl  National Kidney Foundation: www.kidney.[place]: https://mathis.com/  [facility]: www.lifeoptions.org and www.kidneyschool.org Contact a health care provider if:  Your symptoms get worse.  You develop new symptoms. Get help right away if:  You develop symptoms of end-stage kidney disease, which include: ? Headaches. ? Abnormally dark or light skin. ? Numbness in the hands or feet. ? Easy bruising. ? Frequent hiccups. ? Chest pain. ? Shortness of breath. ? End of menstruation in women.  You have a fever.  You have decreased urine  production.  You have pain or bleeding when you urinate. This information is not intended to replace advice given to you by your health care provider. Make sure you discuss any questions you have with your health care provider. Document Released: 11/25/2007 Document Revised: 07/24/2015 Document Reviewed: 10/15/2011 Elsevier Interactive Patient Education  2017 [place].  Diabetes Mellitus and Food It is important for you to manage your blood sugar (glucose) level. Your blood glucose level can be greatly affected by what you eat. Eating healthier foods in the appropriate amounts throughout the day at about the same time each day will help you control your blood glucose level. It can also help slow or prevent worsening of your diabetes mellitus. Healthy eating may even help you improve the level of your blood pressure and reach or maintain a healthy weight. General recommendations for healthful eating and cooking habits include:  Eating meals and snacks regularly. Avoid going long periods of time without eating to lose weight.  Eating a diet that consists mainly of plant-based foods, such as fruits, vegetables, nuts, legumes, and whole grains.  Using low-heat cooking methods, such as baking, instead of high-heat cooking methods, such as deep frying.  Work with your dietitian to make sure you understand how to use the Nutrition Facts information on food labels. How can food affect me? Carbohydrates  Carbohydrates affect your blood glucose level more than any other type of food. Your dietitian will help you determine how many carbohydrates to eat at each meal and teach you how to count carbohydrates. Counting carbohydrates is important to keep your blood glucose at a healthy level, especially if you are using insulin or taking certain medicines for diabetes mellitus. Alcohol Alcohol can cause sudden decreases in blood glucose (hypoglycemia), especially if you use insulin or take certain  medicines for diabetes mellitus. Hypoglycemia can be a life-threatening condition. Symptoms of hypoglycemia (sleepiness, dizziness, and disorientation) are similar to symptoms of having too much alcohol. If your health care provider has given you approval to drink alcohol, do so in moderation and use the following guidelines:  Women should not have more than one drink per day, and men should not have more than two drinks per day. One drink is equal to: ? 12 oz of beer. ? 5 oz of wine. ? 1 oz of hard liquor.  Do not drink on an empty stomach.  Keep yourself hydrated. Have water, diet soda, or unsweetened iced tea.  Regular soda, juice, and other mixers might contain a lot of carbohydrates and should be counted.  What foods are not recommended? As you make food choices, it is important to remember that all foods are not the same. Some foods have fewer nutrients per serving than other foods, even though they might have the same number of calories or carbohydrates. It is difficult to get your body what it needs when you eat foods with fewer nutrients. Examples of foods that you should avoid that are high in calories and carbohydrates but low in nutrients include:  Trans fats (most processed foods list trans fats on the Nutrition Facts label).  Regular soda.  Juice.  Candy.  Sweets, such as cake, pie, doughnuts, and cookies.  Fried foods.  What foods can I eat? Eat nutrient-rich foods, which will nourish your body and keep you healthy. The food you should eat also will depend on several factors, including:  The calories you need.  The medicines you take.  Your weight.  Your blood glucose level.  Your blood pressure level.  Your cholesterol level.  You should eat a variety of foods, including:  Protein. ? Lean cuts of meat. ? Proteins low in saturated fats, such as fish, egg whites, and beans. Avoid processed meats.  Fruits and vegetables. ? Fruits and vegetables that  may help control blood glucose levels, such as apples, mangoes, and yams.  Dairy products. ? Choose fat-free or low-fat dairy products, such as milk, yogurt, and cheese.  Grains, bread, pasta, and rice. ? Choose whole grain products, such as multigrain bread, whole oats, and brown rice. These foods may help control blood pressure.  Fats. ? Foods containing healthful fats, such as nuts, avocado, olive oil, canola oil, and fish.  Does everyone with diabetes mellitus have the same meal plan? Because every person with diabetes mellitus is different, there is not one meal plan that works for everyone. It is very important that you meet with a dietitian who will help you create a meal plan that is just right for you. This information is not intended to replace advice given to you by your health care provider. Make sure you discuss any questions you have with your health care provider. Document Released: 11/12/2004 Document Revised: 07/24/2015 Document Reviewed: 01/12/2013 Elsevier Interactive Patient Education  2017 [employer].

## 2016-10-20 NOTE — Discharge Summary (Signed)
DISCHARGE SUMMARY  Kelli [name]  MR#: [id number]  DOB:[birth year]  Date of Admission: 10/13/2016 Date of Discharge: 10/20/2016  Attending Physician:MCCLUNG,JEFFREY T  [name] MLJ:QGBEEF, Kelli Moment, MD  Consults: Nephrology  Cardiology   Disposition: D/C home - pt refused to consider SNF placement   Follow-up Appts: Follow-up Information    Twana First, MD Follow up on 11/03/2016.   Specialty:  Oncology Why:  at 9:00 am Contact information: [place] [address] [phone]        Fran Lowes, MD Follow up on 11/09/2016.   Specialty:  Nephrology Why:  at 12:00 pm Contact information: [address] [place] [address] [phone]        Manon Hilding, MD Follow up in 5 day(s).   Specialty:  Family Medicine Contact information: [place] [address] [phone]           Tests Needing Follow-up: -renal function, bicarb, K+, and uremic sx should be re-evaluated in 7 days  -Hgb should be checked at time of f/u w/ PCP -assessment of CBG control will be indicated   Discharge Diagnoses: Acute on chronic renal failure--CKD stage 3 Acute respiratory failure with hypoxia Acute on chronic diastolic CHF Atypical chest pain/Elevated troponin Elevated paraproteins Anemia of CKD - symptomatic anemia Essential HTN-uncontrolled Hx of Stroke with left hemiparesis  Diabetes mellitus Hyperlipidemia Leg edema and pain Hypokalemia  Initial presentation: 66 year old female with a history of stroke with residual left hemiparesis, CKD stage III, essential hypertension, diabetes mellitus, peripheral vascular disease presented when she woke up with substernal chest discomfort in the early morning of 10/13/2016.   Upon presentation, the patient was noted to have a hemoglobin of 6.3 and serum creatinine of 4.50. Chest x-ray showed central vascular congestion and interstitial edema. The patient was started on intravenous furosemide  and admitted for further workup of her renal failure.   Nephrology and cardiology were consulted to assist. On 10/18/2016, the patient was more short of breath than usual. Repeat chest x-ray showed worsened intersitial edema. Additional furosemide was given IV in addition to her torsemide.  Hospital Course:  Acute on chronic renal failure--CKD stage 3 -baseline creatinine 1.2-1.5 but no recent BMP -last serum creatinine 1.49 on 04/29/2014 -likely has progression of her underlying CKD with acute worsening possibly due to hemodynamic factors from CHF -Nephrology followed her during her hospital stay and is to see her in outpt f/u  -renal US - nonvisualization of the left kidney secondary to bowel gas - right renal echogenicity without hydronephrosis -ANA 1:160 but complements and dsDNA negative -it is anticipated that the pt is headed toward a need for permanent HD, but she does not yet require this - with stabilization of her crt, lytes, and bicarb, and w/ no uremic sx, she is cleared for d/c home w/ outpt Nephrology and PCP f/u   Acute respiratory failure with hypoxia due to CHF and acute on chronic kidney failure - weaned to RA just prior to d/c home - clinically feels much improved at time of d/c   Acute on chronic diastolic CHF -net negative ~8lbs for the admission - unfortunately Is/Os do not appear to be accurate -10/14/16-Echo--EF 55-60%, grade 2 DD, no WMA. 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 Talbert Cage  Anemia of CKD - symptomatic  anemia -transfused 2units PRBCs for the admssion -8/15 FOBT neg -10/13/16--Iron saturation 11%, ferritin 67, folic acid 8.7 -0/25/85 Hgb drop to 7.8, but remained stable th/o remainder of hospital stay  -no evidence of hemolysis on labs eval  Essential HTN-uncontrolled -cannot use ACEi due to renal failure -BP meds adjusted during this hospital stay - BP controlled at time of d/c   Hx of Stroke with left hemiparesis  -continue ASA -pt has loop recorder placed Feb 2015 -continue statin -PT eval--pt refuses SNF  Diabetes mellitus -d/c metformin altogether due to renal failure -8/17 A1C 5.7 -CBG stable at time of d/c - d/c home on diabetic diet only and follow CBG trend   Hyperlipidemia -continue statin  Leg edema and pain -venous duplex neg for DVT   Hypokalemia -K+ normal at time of d/c   Difficult IV access -consulted Dr. Arnoldo Morale for central line placed 10/16/16 - to be removed prior to d/c home    Allergies as of 10/20/2016      Reactions   Hydrochlorothiazide Nausea And Vomiting      Medication List    STOP taking these medications   cephALEXin 250 MG capsule Commonly known as:  KEFLEX   cloNIDine 0.2 MG tablet Commonly known as:  CATAPRES   metFORMIN 500 MG 24 hr tablet Commonly known as:  GLUCOPHAGE-XR   simvastatin 10 MG tablet Commonly known as:  ZOCOR     TAKE these medications   amLODipine 10 MG tablet Commonly known as:  NORVASC Take 10 mg by mouth daily.   atorvastatin 40 MG tablet Commonly known as:  LIPITOR Take 1 tablet (40 mg total) by mouth daily at 6 PM.   calcitRIOL 0.25 MCG capsule Commonly known as:  ROCALTROL Take 1 capsule (0.25 mcg total) by mouth daily.   clopidogrel 75 MG tablet Commonly known as:  PLAVIX Take 75 mg by mouth daily.   hydrALAZINE 50 MG tablet Commonly known as:  APRESOLINE Take 1 tablet (50 mg total) by mouth 3 (three) times daily. What changed:  when to take this   metoprolol succinate 100 MG  24 hr tablet Commonly known as:  TOPROL-XL Take 1 tablet (100 mg total) by mouth daily. Take with or immediately following a meal.   potassium chloride SA 20 MEQ tablet Commonly known as:  K-DUR,KLOR-CON Take 1 tablet (20 mEq total) by mouth daily.   sodium bicarbonate 650 MG tablet Take 1 tablet (650 mg total) by mouth 3 (three) times daily.   torsemide 20 MG tablet Commonly known as:  DEMADEX Take 2 tablets (40 mg total) by mouth daily.   venlafaxine XR 150 MG 24 hr capsule Commonly known as:  EFFEXOR-XR Take 150 mg by mouth daily with breakfast.            Discharge Care Instructions        Start     Ordered   10/20/16 0000  atorvastatin (LIPITOR) 40 MG tablet  Daily-1800     10/20/16 1534   10/20/16 0000  calcitRIOL (ROCALTROL) 0.25 MCG capsule  Daily     10/20/16 1534   10/20/16 0000  hydrALAZINE (APRESOLINE) 50 MG tablet  3 times daily     10/20/16 1534   10/20/16 0000  metoprolol succinate (TOPROL-XL) 100 MG 24  hr tablet  Daily     10/20/16 1534   10/20/16 0000  potassium chloride SA (K-DUR,KLOR-CON) 20 MEQ tablet  Daily     10/20/16 1534   10/20/16 0000  sodium bicarbonate 650 MG tablet  3 times daily     10/20/16 1534   10/20/16 0000  torsemide (DEMADEX) 20 MG tablet  Daily     10/20/16 1534      Day of Discharge BP (!) 123/59 (BP Location: Left Arm)   Pulse 78   Temp 97.9 F (36.6 C) (Oral)   Resp 16   Ht 5\' 5"  (1.651 m)   Wt 87.2 kg (192 lb 3.9 oz)   SpO2 100%   BMI 31.99 kg/m   Physical Exam: General: No acute respiratory distress Lungs: Poor air movement bilateral bases no wheezing -  good air movement throughout other fields  Cardiovascular: Regular rate and rhythm without murmur gallop or rub normal S1 and S2 Abdomen: Nontender, overweight, soft, bowel sounds positive, no rebound, no ascites, no appreciable mass Extremities: 1+ edema bilateral lower extremities without cyanosis   Basic Metabolic Panel:  Recent Labs Lab 10/14/16 0611  10/15/16 0624 10/16/16 1341 10/17/16 0830 10/18/16 0653 10/19/16 1105 10/20/16 1100  NA 137 137 135 138 138 135 138  K 3.5 3.3* 3.5 3.6 3.8 3.8 4.3  CL 106 103 101 101 103 100* 103  CO2 20* 21* 23 24 23 24  20*  GLUCOSE 170* 171* 255* 207* 157* 179* 136*  BUN 41* 47* 50* 51* 54* 58* 64*  CREATININE 4.51* 4.61* 4.84* 4.62* 4.79* 5.05* 4.85*  CALCIUM 7.8*  7.6* 7.6* 7.4* 8.0* 8.0* 8.0* 8.2*  MG 1.6*  --  1.5* 2.1  --   --   --   PHOS 5.9* 5.6* 4.7* 5.4* 5.2* 4.7*  --     Liver Function Tests:  Recent Labs Lab 10/15/16 0624 10/16/16 1341 10/17/16 0830 10/18/16 0653 10/19/16 1105  AST  --   --   --   --  14*  ALT  --   --   --   --  14  ALKPHOS  --   --   --   --  59  BILITOT  --   --   --   --  0.4  PROT  --   --   --   --  6.6  ALBUMIN 3.1* 3.0* 3.3* 3.0* 3.0*  3.0*   CBC:  Recent Labs Lab 10/14/16 0611 10/15/16 0624 10/18/16 1659 10/19/16 1105 10/20/16 1100  WBC 16.7* 13.4* 13.7* 14.3* 11.9*  HGB 9.3* 9.6* 7.8* 7.7* 7.7*  HCT 26.9* 28.2* 23.8* 24.6* 24.3*  MCV 85.4 86.0 88.1 91.1 89.7  PLT 332 331 345 382 325    Cardiac Enzymes:  Recent Labs Lab 10/13/16 1951  TROPONINI 0.05*    CBG:  Recent Labs Lab 10/19/16 1127 10/19/16 1558 10/19/16 2003 10/20/16 0730 10/20/16 1122  GLUCAP 144* 154* 167* 148* 153*    Time spent in discharge (includes decision making & examination of pt): 35 minutes  10/20/2016, 3:36 PM   Cherene Altes, MD [facility] Office  [phone] Pager [phone]  On-Call/Text Page:      [email or web address]      password [facility]

## 2016-11-03 ENCOUNTER — Ambulatory Visit (HOSPITAL_COMMUNITY): Payer: 59

## 2016-11-16 ENCOUNTER — Encounter (HOSPITAL_COMMUNITY)
Admission: RE | Admit: 2016-11-16 | Discharge: 2016-11-16 | Disposition: A | Discharge status: Home / Self Care | Payer: 59 | Source: Ambulatory Visit | Attending: Nephrology | Admitting: Nephrology

## 2016-11-16 ENCOUNTER — Encounter (HOSPITAL_COMMUNITY)
Admission: RE | Admit: 2016-11-16 | Discharge: 2016-11-16 | Disposition: A | Discharge status: Home / Self Care | Payer: 59 | Source: Ambulatory Visit

## 2016-11-16 DIAGNOSIS — Z90710 Acquired absence of both cervix and uterus: Secondary | ICD-10-CM | POA: Insufficient documentation

## 2016-11-16 DIAGNOSIS — Z8249 Family history of ischemic heart disease and other diseases of the circulatory system: Secondary | ICD-10-CM | POA: Diagnosis not present

## 2016-11-16 DIAGNOSIS — Z79899 Other long term (current) drug therapy: Secondary | ICD-10-CM | POA: Insufficient documentation

## 2016-11-16 DIAGNOSIS — Z833 Family history of diabetes mellitus: Secondary | ICD-10-CM | POA: Insufficient documentation

## 2016-11-16 DIAGNOSIS — E782 Mixed hyperlipidemia: Secondary | ICD-10-CM | POA: Diagnosis not present

## 2016-11-16 DIAGNOSIS — I69354 Hemiplegia and hemiparesis following cerebral infarction affecting left non-dominant side: Secondary | ICD-10-CM | POA: Insufficient documentation

## 2016-11-16 DIAGNOSIS — D631 Anemia in chronic kidney disease: Secondary | ICD-10-CM | POA: Diagnosis not present

## 2016-11-16 DIAGNOSIS — N185 Chronic kidney disease, stage 5: Secondary | ICD-10-CM | POA: Insufficient documentation

## 2016-11-16 DIAGNOSIS — Z87891 Personal history of nicotine dependence: Secondary | ICD-10-CM | POA: Diagnosis not present

## 2016-11-16 DIAGNOSIS — R7303 Prediabetes: Secondary | ICD-10-CM | POA: Diagnosis not present

## 2016-11-16 DIAGNOSIS — Z888 Allergy status to other drugs, medicaments and biological substances status: Secondary | ICD-10-CM | POA: Diagnosis not present

## 2016-11-16 DIAGNOSIS — E538 Deficiency of other specified B group vitamins: Secondary | ICD-10-CM | POA: Diagnosis not present

## 2016-11-16 DIAGNOSIS — Z7902 Long term (current) use of antithrombotics/antiplatelets: Secondary | ICD-10-CM | POA: Diagnosis not present

## 2016-11-16 DIAGNOSIS — Z8711 Personal history of peptic ulcer disease: Secondary | ICD-10-CM | POA: Diagnosis not present

## 2016-11-16 DIAGNOSIS — M797 Fibromyalgia: Secondary | ICD-10-CM | POA: Diagnosis not present

## 2016-11-16 LAB — HEMOGLOBIN AND HEMATOCRIT, BLOOD
HEMATOCRIT: 22.1 % — AB (ref 36.0–46.0)
HEMOGLOBIN: 7.4 g/dL — AB (ref 12.0–15.0)

## 2016-11-16 LAB — POCT HEMOGLOBIN-HEMACUE: HEMOGLOBIN: 7.1 g/dL — AB (ref 12.0–15.0)

## 2016-11-16 LAB — PREPARE RBC (CROSSMATCH)

## 2016-11-16 MED ORDER — EPOETIN ALFA 3000 UNIT/ML IJ SOLN
INTRAMUSCULAR | Status: AC
  Filled 2016-11-16: qty 2

## 2016-11-16 MED ORDER — EPOETIN ALFA 3000 UNIT/ML IJ SOLN
3000.0000 [IU] | Freq: Once | INTRAMUSCULAR | Status: AC
  Administered 2016-11-16: 3000 [IU] via SUBCUTANEOUS

## 2016-11-16 NOTE — Progress Notes (Signed)
Dr Lowanda Foster notified of Hgb 7.1. Instructed to proceed with procrit injection and to notify PCP, Dr Quintin Alto, [place], for further orders.

## 2016-11-16 NOTE — Progress Notes (Signed)
Dr Quintin Alto notified of Hgb 7.1 and that Dr Lowanda Foster wanted him to be notified for further orders. Dr Quintin Alto gave orders for 2 units PRBC's to be given and lasix 80 mg to be given between units. Pt and husband notified and in agreement. Blood drawn for type and cross and sent to lab for results. Consent signed. appt made for 11/17/16 for transfusion. Procedure explained. Information sheet for procrit given to husband. Voiced understanding.

## 2016-11-16 NOTE — Discharge Instructions (Signed)
Epoetin Alfa injection °What is this medicine? °EPOETIN ALFA (e POE e tin AL fa) helps your body make more red blood cells. This medicine is used to treat anemia caused by chronic kidney failure, cancer chemotherapy, or HIV-therapy. It may also be used before surgery if you have anemia. °This medicine may be used for other purposes; ask your health care provider or pharmacist if you have questions. °COMMON BRAND NAME(S): Epogen, Procrit °What should I tell my health care provider before I take this medicine? °They need to know if you have any of these conditions: °-blood clotting disorders °-cancer patient not on chemotherapy °-cystic fibrosis °-heart disease, such as angina or heart failure °-hemoglobin level of 12 g/dL or greater °-high blood pressure °-low levels of folate, iron, or vitamin B12 °-seizures °-an unusual or allergic reaction to erythropoietin, albumin, benzyl alcohol, hamster proteins, other medicines, foods, dyes, or preservatives °-pregnant or trying to get pregnant °-breast-feeding °How should I use this medicine? °This medicine is for injection into a vein or under the skin. It is usually given by a health care professional in a hospital or clinic setting. °If you get this medicine at home, you will be taught how to prepare and give this medicine. Use exactly as directed. Take your medicine at regular intervals. Do not take your medicine more often than directed. °It is important that you put your used needles and syringes in a special sharps container. Do not put them in a trash can. If you do not have a sharps container, call your pharmacist or healthcare provider to get one. °A special MedGuide will be given to you by the pharmacist with each prescription and refill. Be sure to read this information carefully each time. °Talk to your pediatrician regarding the use of this medicine in children. While this drug may be prescribed for selected conditions, precautions do apply. °Overdosage: If you  think you have taken too much of this medicine contact a poison control center or emergency room at once. °NOTE: This medicine is only for you. Do not share this medicine with others. °What if I miss a dose? °If you miss a dose, take it as soon as you can. If it is almost time for your next dose, take only that dose. Do not take double or extra doses. °What may interact with this medicine? °Do not take this medicine with any of the following medications: °-darbepoetin alfa °This list may not describe all possible interactions. Give your health care provider a list of all the medicines, herbs, non-prescription drugs, or dietary supplements you use. Also tell them if you smoke, drink alcohol, or use illegal drugs. Some items may interact with your medicine. °What should I watch for while using this medicine? °Your condition will be monitored carefully while you are receiving this medicine. °You may need blood work done while you are taking this medicine. °What side effects may I notice from receiving this medicine? °Side effects that you should report to your doctor or health care professional as soon as possible: °-allergic reactions like skin rash, itching or hives, swelling of the face, lips, or tongue °-breathing problems °-changes in vision °-chest pain °-confusion, trouble speaking or understanding °-feeling faint or lightheaded, falls °-high blood pressure °-muscle aches or pains °-pain, swelling, warmth in the leg °-rapid weight gain °-severe headaches °-sudden numbness or weakness of the face, arm or leg °-trouble walking, dizziness, loss of balance or coordination °-seizures (convulsions) °-swelling of the ankles, feet, hands °-unusually weak or tired °  Side effects that usually do not require medical attention (report to your doctor or health care professional if they continue or are bothersome): °-diarrhea °-fever, chills (flu-like symptoms) °-headaches °-nausea, vomiting °-redness, stinging, or swelling at  site where injected °This list may not describe all possible side effects. Call your doctor for medical advice about side effects. You may report side effects to FDA at 1-800-FDA-1088. °Where should I keep my medicine? °Keep out of the reach of children. °Store in a refrigerator between 2 and 8 degrees C (36 and 46 degrees F). Do not freeze or shake. Throw away any unused portion if using a single-dose vial. Multi-dose vials can be kept in the refrigerator for up to 21 days after the initial dose. Throw away unused medicine. °NOTE: This sheet is a summary. It may not cover all possible information. If you have questions about this medicine, talk to your doctor, pharmacist, or health care provider. °© 2018 Elsevier/Gold Standard (2015-10-06 19:42:31) ° °

## 2016-11-16 NOTE — Progress Notes (Signed)
Results for Kelli [name], Kelli [name] (MRN [id number]) as of 11/16/2016 15:22  Ref. Range 11/16/2016 14:16  Hemoglobin Latest Ref Range: 12.0 - 15.0 g/dL 7.1 (L)

## 2016-11-17 ENCOUNTER — Encounter (HOSPITAL_COMMUNITY)
Admission: RE | Admit: 2016-11-17 | Discharge: 2016-11-17 | Disposition: A | Discharge status: Home / Self Care | Payer: 59 | Source: Ambulatory Visit | Attending: Nephrology | Admitting: Nephrology

## 2016-11-17 DIAGNOSIS — N185 Chronic kidney disease, stage 5: Secondary | ICD-10-CM | POA: Diagnosis not present

## 2016-11-17 MED ORDER — FUROSEMIDE 10 MG/ML IJ SOLN
80.0000 mg | Freq: Once | INTRAMUSCULAR | Status: AC
  Administered 2016-11-17: 80 mg via INTRAVENOUS

## 2016-11-17 MED ORDER — FUROSEMIDE 10 MG/ML IJ SOLN
INTRAMUSCULAR | Status: AC
  Filled 2016-11-17: qty 8

## 2016-11-17 MED ORDER — SODIUM CHLORIDE 0.9 % IV SOLN: Freq: Once | INTRAVENOUS | Status: DC

## 2016-11-18 ENCOUNTER — Encounter (HOSPITAL_BASED_OUTPATIENT_CLINIC_OR_DEPARTMENT_OTHER): Payer: 59 | Admitting: Oncology

## 2016-11-18 VITALS — BP 185/60 | HR 67 | Resp 16 | Wt 184.0 lb

## 2016-11-18 DIAGNOSIS — D631 Anemia in chronic kidney disease: Secondary | ICD-10-CM | POA: Diagnosis not present

## 2016-11-18 DIAGNOSIS — N189 Chronic kidney disease, unspecified: Secondary | ICD-10-CM | POA: Diagnosis not present

## 2016-11-18 DIAGNOSIS — E538 Deficiency of other specified B group vitamins: Secondary | ICD-10-CM

## 2016-11-18 LAB — TYPE AND SCREEN
ABO/RH(D): A NEG
Antibody Screen: NEGATIVE
UNIT DIVISION: 0
Unit division: 0

## 2016-11-18 LAB — BPAM RBC
Blood Product Expiration Date: 201809272359
Blood Product Expiration Date: 201809272359
ISSUE DATE / TIME: 201809191309
ISSUE DATE / TIME: 201809191040
Unit Type and Rh: 600
Unit Type and Rh: 600

## 2016-11-18 MED ORDER — CYANOCOBALAMIN 1000 MCG/ML IJ SOLN
1000.0000 ug | Freq: Once | INTRAMUSCULAR | Status: AC
  Administered 2016-11-18: 1000 ug via INTRAMUSCULAR

## 2016-11-18 MED ORDER — CYANOCOBALAMIN 1000 MCG/ML IJ SOLN
INTRAMUSCULAR | Status: AC
  Filled 2016-11-18: qty 1

## 2016-11-18 NOTE — Progress Notes (Signed)
Pt given B12 injection today. Pt given injection in her right deltoid. Pt tolerated injection well. Pt stable and discharged home ambulatory.

## 2016-11-18 NOTE — Patient Instructions (Signed)
[facility] Discharge Instructions  RECOMMENDATIONS MADE BY THE CONSULTANT AND ANY TEST RESULTS WILL BE SENT TO YOUR REFERRING PHYSICIAN.  Your were seen by Dr.Zhou today. Follow up as scheduled.  Thank you for choosing [facility] to provide your oncology and hematology care.  To afford each patient quality time with our provider, please arrive at least 15 minutes before your scheduled appointment time.    If you have a lab appointment with the [facility] please come in thru the  Main Entrance and check in at the main information desk  You need to re-schedule your appointment should you arrive 10 or more minutes late.  We strive to give you quality time with our providers, and arriving late affects you and other patients whose appointments are after yours.  Also, if you no show three or more times for appointments you may be dismissed from the clinic at the providers discretion.     Again, thank you for choosing [facility].  Our hope is that these requests will decrease the amount of time that you wait before being seen by our physicians.       _____________________________________________________________  Should you have questions after your visit to [facility], please contact our office at [phone] between the hours of 8:30 a.m. and 4:30 p.m.  Voicemails left after 4:30 p.m. will not be returned until the following business day.  For prescription refill requests, have your pharmacy contact our office.       Resources For Cancer Patients and their Caregivers ? American Cancer Society: Can assist with transportation, wigs, general needs, runs Look Good Feel Better.        539-717-0483 ? Cancer Care: Provides financial assistance, online support groups, medication/co-pay assistance.  1-800-813-HOPE (917)695-2981) ? [place] Assists [place] cancer  patients and their families through emotional , educational and financial support.  984-191-9467 ? [place] DSS Where to apply for food stamps, Medicaid and utility assistance. 541-862-8399 ? [facility]: Transportation to medical appointments. (458)826-0221 ? Social Security Administration: May apply for disability if have a Stage IV cancer. (657)330-0346 419-585-1023 ? [employer], Disability and Transit Services: Assists with nutrition, care and transit needs. [facility] Support Programs: @10RELATIVEDAYS @ > Cancer Support Group  2nd Tuesday of the month 1pm-2pm, Journey Room  > Creative Journey  3rd Tuesday of the month 1130am-1pm, Journey Room  > Look Good Feel Better  1st Wednesday of the month 10am-12 noon, Journey Room (Call [place] to register [phone])

## 2016-11-19 DIAGNOSIS — N184 Chronic kidney disease, stage 4 (severe): Secondary | ICD-10-CM | POA: Diagnosis not present

## 2016-11-19 DIAGNOSIS — I10 Essential (primary) hypertension: Secondary | ICD-10-CM | POA: Diagnosis not present

## 2016-11-19 DIAGNOSIS — E1165 Type 2 diabetes mellitus with hyperglycemia: Secondary | ICD-10-CM | POA: Diagnosis not present

## 2016-11-19 DIAGNOSIS — I5033 Acute on chronic diastolic (congestive) heart failure: Secondary | ICD-10-CM | POA: Diagnosis not present

## 2016-11-19 DIAGNOSIS — E1122 Type 2 diabetes mellitus with diabetic chronic kidney disease: Secondary | ICD-10-CM | POA: Diagnosis not present

## 2016-11-19 DIAGNOSIS — E782 Mixed hyperlipidemia: Secondary | ICD-10-CM | POA: Diagnosis not present

## 2016-11-19 NOTE — Progress Notes (Signed)
[facility] Initial Visit:  Patient Care Team: [name], [name], MD as PCP - General (Cardiology) Satira Sark, MD as Consulting Physician (Cardiology)  CHIEF COMPLAINTS/PURPOSE OF CONSULTATION:  Anemia in the setting of CKD   HISTORY OF PRESENTING ILLNESS: Kelli [name] 66 y.o. female presents today for evaluation of  anemia. Patient initially went to the emergency department on 10/13/2016 for chest discomfort and was found to have a hemoglobin 6.3 g/dL. She was given 2 units PRBC transfusion. Upon admission she was also found to have a serum creatinine of 4.50 which is up from her baseline of 1.2-1.5 in 2016. There was question about whether this was progression of her underlying CKD with acute worsening possibly due to hemodynamic factors from CHF. She was seen by nephrology while she was inpatient. Fecal occult blood was checked on a 10/13/16 was negative. Iron studies, folate were within normal limits. Vitamin B12 level 213 on 10/13/2016. SPEP was negative. Both kappa and lambda light chains were elevated. Her most recent creatinine on discharge on 10/20/2016 was 4.85. [name] hemoglobin was recently rechecked on 11/16/2016 and was found to be low at 7.1 g/dL and she received another 2 units PRBC transfusion. Patient has been followed by nephrology and has been started on ESA. It is unclear whether she is receiving Procrit or Aranesp but she received her first injection on 11/16/2016 and the plan is continue her on ESA every 2 weeks.  Patient states that she feels weak. She denies any chest pain, shortness of breath, abdominal pain. 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 She denies any recent infections or fevers or chills.  Review of Systems - Oncology ROS as per HPI otherwise 12 point ROS is negative.  MEDICAL HISTORY: Past Medical History:  Diagnosis Date  . Borderline diabetes   . Carotid artery occlusion    Occluded RICA, status post left CEA   August 2014 - Dr. Donnetta Hutching  . Cerebral infarction [facility]) Aug 2014   Bihemispheric watershed infarcts  . 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 CKD (chronic kidney disease) stage 3, GFR 30-59 ml/min   . Closed dislocation of left humerus 07/26/2013  . Essential hypertension, benign   . Fibromyalgia   . Mixed hyperlipidemia   . Multiple gastric ulcers   . Urinary incontinence     SURGICAL HISTORY: Past Surgical History:  Procedure Laterality Date  . COMBINED HYSTERECTOMY VAGINAL W/ MMK / A&P REPAIR  1981  . ENDARTERECTOMY Left 10/06/2012   Procedure: Carotid Endarterectomy with Finesse patch angioplasty;  Surgeon: [name], MD;  Location: [facility];  Service: Vascular;  Laterality: Left;  . ESOPHAGOGASTRODUODENOSCOPY N/A 04/26/2014   Procedure: ESOPHAGOGASTRODUODENOSCOPY (EGD);  Surgeon: Lear Ng, MD;  Location[facility] ENDOSCOPY;  Service: Endoscopy;  Laterality: N/A;  . LOOP RECORDER IMPLANT  04/16/13   MDT LinQ implanted for cryptogenic stroke  . [name] WITHOUT CARDIOVERSION N/A 04/16/2013   Procedure: TRANSESOPHAGEAL ECHOCARDIOGRAM ([name]);  Surgeon: Josue Hector, MD;  Location: [facility] ENDOSCOPY;  Service: Cardiovascular;  Laterality: N/A;  . URETHRAL DILATION  1980's  . VAGINAL HYSTERECTOMY  1981   "partial" (10/04/2012)    SOCIAL HISTORY: Social History   Social History  . Marital status: Married    Spouse name: N/A  . Number of children: N/A  . Years of education: N/A   Occupational History  . Not on file.   Social History Main Topics  . Smoking status: Former Smoker  Packs/day: 1.00    Years: 44.00    Types: Cigarettes    Quit date: 10/03/2012  . Smokeless tobacco: Never Used  . Alcohol use No  . Drug use: No  . Sexual activity: Yes    Birth control/ protection: Surgical   Other Topics Concern  . Not on file   Social History Narrative  . No narrative on file    FAMILY HISTORY Family History  Problem Relation Age of Onset  .  Diabetes Mother   . Diabetes Father   . Hypertension Sister   . Diabetes Brother   . Seizures Son   . Kidney disease Maternal Grandmother   . Hypertension Maternal Grandmother   . Heart disease Maternal Grandfather   . Diabetes Paternal Grandmother   . Heart disease Paternal Grandfather     ALLERGIES:  is allergic to hydrochlorothiazide.  MEDICATIONS:  Current Outpatient Prescriptions  Medication Sig Dispense Refill  . amLODipine (NORVASC) 10 MG tablet Take 10 mg by mouth daily.    Marland Kitchen atorvastatin (LIPITOR) 40 MG tablet Take 1 tablet (40 mg total) by mouth daily at 6 PM. 30 tablet 0  . calcitRIOL (ROCALTROL) 0.25 MCG capsule Take 1 capsule (0.25 mcg total) by mouth daily. 30 capsule 0  . clopidogrel (PLAVIX) 75 MG tablet Take 75 mg by mouth daily.    . hydrALAZINE (APRESOLINE) 50 MG tablet Take 1 tablet (50 mg total) by mouth 3 (three) times daily. 90 tablet 0  . metoprolol succinate (TOPROL-XL) 100 MG 24 hr tablet Take 1 tablet (100 mg total) by mouth daily. Take with or immediately following a meal. 30 tablet 0  . potassium chloride SA (K-DUR,KLOR-CON) 20 MEQ tablet Take 1 tablet (20 mEq total) by mouth daily. 14 tablet 0  . sodium bicarbonate 650 MG tablet Take 1 tablet (650 mg total) by mouth 3 (three) times daily. 90 tablet 0  . torsemide (DEMADEX) 20 MG tablet Take 2 tablets (40 mg total) by mouth daily. 60 tablet 0  . venlafaxine XR (EFFEXOR-XR) 150 MG 24 hr capsule Take 150 mg by mouth daily with breakfast.     No current facility-administered medications for this visit.     PHYSICAL EXAMINATION:   Vitals:   11/18/16 1252  BP: (!) 185/60  Pulse: 67  Resp: 16  SpO2: 97%    Filed Weights   11/18/16 1252  Weight: 184 lb (83.5 kg)     Physical Exam Constitutional: Well-developed, well-nourished, and in no distress.   HENT:  Head: Normocephalic and atraumatic.  Mouth/Throat: No oropharyngeal exudate. Mucosa moist. Eyes: Pupils are equal, round, and reactive to  light. Conjunctivae are normal. No scleral icterus.  Neck: Normal range of motion. Neck supple. No JVD present.  Cardiovascular: Normal rate, regular rhythm and normal heart sounds.  Exam reveals no gallop and no friction rub.   No murmur heard. Pulmonary/Chest: Effort normal and breath sounds normal. No respiratory distress. No wheezes.No rales.  Abdominal: Soft. Bowel sounds are normal. No distension. There is no tenderness. There is no guarding.  Musculoskeletal: No edema or tenderness. Left hemiparesis. Lymphadenopathy:    No cervical or supraclavicular adenopathy.  Neurological: Alert and oriented to person, place, and time. No cranial nerve deficit. Left hemiparesis. Skin: Skin is warm and dry. No rash noted. No erythema. No pallor.  Psychiatric: Affect and judgment normal.   LABORATORY DATA: I have personally reviewed the data as listed:  Hospital Outpatient Visit on 11/16/2016  Component Date Value Ref Range Status  .  ABO/RH(D) 11/16/2016 A NEG   Final  . Antibody Screen 11/16/2016 NEG   Final  . Sample Expiration 11/16/2016 11/19/2016   Final  . Unit Number 11/16/2016 [id number]   Final  . Blood Component Type 11/16/2016 RED CELLS,LR   Final  . Unit division 11/16/2016 00   Final  . Status of Unit 11/16/2016 ISSUED,FINAL   Final  . Transfusion Status 11/16/2016 OK TO TRANSFUSE   Final  . Crossmatch Result 11/16/2016 Compatible   Final  . Unit Number 11/16/2016 [id number]   Final  . Blood Component Type 11/16/2016 RED CELLS,LR   Final  . Unit division 11/16/2016 00   Final  . Status of Unit 11/16/2016 ISSUED,FINAL   Final  . Transfusion Status 11/16/2016 OK TO TRANSFUSE   Final  . Crossmatch Result 11/16/2016 Compatible   Final  . Hemoglobin 11/16/2016 7.4* 12.0 - 15.0 g/dL Final  . HCT 11/16/2016 22.1* 36.0 - 46.0 % Final  . Order Confirmation 11/16/2016 ORDER PROCESSED BY BLOOD BANK   Final  . ISSUE DATE / TIME 11/16/2016 [id number]   Final  . Blood Product Unit  Number 11/16/2016 [id number]   Final  . PRODUCT CODE 11/16/2016 [id number]   Final  . Unit Type and Rh 11/16/2016 0600   Final  . Blood Product Expiration Date 11/16/2016 [id number]   Final  . ISSUE DATE / TIME 11/16/2016 [id number]   Final  . Blood Product Unit Number 11/16/2016 [id number]   Final  . PRODUCT CODE 11/16/2016 [id number]   Final  . Unit Type and Rh 11/16/2016 0600   Final  . Blood Product Expiration Date 11/16/2016 [id number]   Final  Hospital Outpatient Visit on 11/16/2016  Component Date Value Ref Range Status  . Hemoglobin 11/16/2016 7.1* 12.0 - 15.0 g/dL Final  Admission on 10/13/2016, Discharged on 10/20/2016  No results displayed because visit has over 200 results.      RADIOGRAPHIC STUDIES: I have personally reviewed the radiological images as listed and agree with the findings in the report  No results found.  ASSESSMENT/PLAN Anemia in the setting of renal disease B12 deficiency  PLAN: -Patient has already been set up q2weeks for ESA by her nephrologist. -We will start her on monthly B12 injections. First injection today. -RTC in 2 months with labs for follow up.   Orders Placed This Encounter  Procedures  . CBC with Differential    Standing Status:   Future    Standing Expiration Date:   11/18/2017  . Comprehensive metabolic panel    Standing Status:   Future    Standing Expiration Date:   11/18/2017  . Iron and TIBC    Standing Status:   Future    Standing Expiration Date:   11/18/2017  . Ferritin    Standing Status:   Future    Standing Expiration Date:   11/18/2017  . Vitamin B12    Standing Status:   Future    Standing Expiration Date:   11/18/2017  . Folate    Standing Status:   Future    Standing Expiration Date:   11/18/2017  . Erythropoietin    Standing Status:   Future    Standing Expiration Date:   11/18/2017  . SCHEDULING COMMUNICATION INJECTION    Schedule 15 minute injection appointment    All questions were answered. The  patient knows to call the clinic with any problems, questions or concerns.  This note was electronically signed.    Twana First, MD  11/19/2016 3:29  PM

## 2016-11-24 DIAGNOSIS — M722 Plantar fascial fibromatosis: Secondary | ICD-10-CM | POA: Insufficient documentation

## 2016-11-30 ENCOUNTER — Encounter (HOSPITAL_COMMUNITY)
Admission: RE | Admit: 2016-11-30 | Discharge: 2016-11-30 | Disposition: A | Discharge status: Home / Self Care | Payer: 59 | Source: Ambulatory Visit | Attending: Nephrology | Admitting: Nephrology

## 2016-11-30 DIAGNOSIS — N185 Chronic kidney disease, stage 5: Secondary | ICD-10-CM | POA: Diagnosis present

## 2016-11-30 DIAGNOSIS — D631 Anemia in chronic kidney disease: Secondary | ICD-10-CM | POA: Diagnosis not present

## 2016-11-30 LAB — POCT HEMOGLOBIN-HEMACUE: Hemoglobin: 10.1 g/dL — ABNORMAL LOW (ref 12.0–15.0)

## 2016-11-30 NOTE — Progress Notes (Signed)
Results for ANALISA, SLEDD (MRN [id number]) as of 11/30/2016 14:34  Ref. Range 11/30/2016 12:16  Hemoglobin Latest Ref Range: 12.0 - 15.0 g/dL 10.1 (L)

## 2016-12-14 ENCOUNTER — Encounter (HOSPITAL_COMMUNITY)
Admission: RE | Admit: 2016-12-14 | Discharge: 2016-12-14 | Disposition: A | Discharge status: Home / Self Care | Payer: 59 | Source: Ambulatory Visit | Attending: Nephrology | Admitting: Nephrology

## 2016-12-14 ENCOUNTER — Encounter (HOSPITAL_COMMUNITY): Payer: 59 | Attending: Nephrology

## 2016-12-14 DIAGNOSIS — Z8711 Personal history of peptic ulcer disease: Secondary | ICD-10-CM | POA: Insufficient documentation

## 2016-12-14 DIAGNOSIS — N185 Chronic kidney disease, stage 5: Secondary | ICD-10-CM | POA: Diagnosis not present

## 2016-12-14 DIAGNOSIS — D631 Anemia in chronic kidney disease: Secondary | ICD-10-CM | POA: Insufficient documentation

## 2016-12-14 DIAGNOSIS — E782 Mixed hyperlipidemia: Secondary | ICD-10-CM | POA: Insufficient documentation

## 2016-12-14 DIAGNOSIS — Z7902 Long term (current) use of antithrombotics/antiplatelets: Secondary | ICD-10-CM | POA: Insufficient documentation

## 2016-12-14 DIAGNOSIS — Z8249 Family history of ischemic heart disease and other diseases of the circulatory system: Secondary | ICD-10-CM | POA: Insufficient documentation

## 2016-12-14 DIAGNOSIS — E538 Deficiency of other specified B group vitamins: Secondary | ICD-10-CM | POA: Insufficient documentation

## 2016-12-14 DIAGNOSIS — M797 Fibromyalgia: Secondary | ICD-10-CM | POA: Insufficient documentation

## 2016-12-14 DIAGNOSIS — I69354 Hemiplegia and hemiparesis following cerebral infarction affecting left non-dominant side: Secondary | ICD-10-CM | POA: Insufficient documentation

## 2016-12-14 DIAGNOSIS — Z87891 Personal history of nicotine dependence: Secondary | ICD-10-CM | POA: Insufficient documentation

## 2016-12-14 DIAGNOSIS — Z79899 Other long term (current) drug therapy: Secondary | ICD-10-CM | POA: Insufficient documentation

## 2016-12-14 DIAGNOSIS — Z833 Family history of diabetes mellitus: Secondary | ICD-10-CM | POA: Insufficient documentation

## 2016-12-14 DIAGNOSIS — Z90710 Acquired absence of both cervix and uterus: Secondary | ICD-10-CM | POA: Insufficient documentation

## 2016-12-14 DIAGNOSIS — R7303 Prediabetes: Secondary | ICD-10-CM | POA: Insufficient documentation

## 2016-12-14 DIAGNOSIS — Z888 Allergy status to other drugs, medicaments and biological substances status: Secondary | ICD-10-CM | POA: Insufficient documentation

## 2016-12-14 LAB — POCT HEMOGLOBIN-HEMACUE: HEMOGLOBIN: 9.8 g/dL — AB (ref 12.0–15.0)

## 2016-12-14 MED ORDER — EPOETIN ALFA 3000 UNIT/ML IJ SOLN
6000.0000 [IU] | Freq: Once | INTRAMUSCULAR | Status: AC
  Administered 2016-12-14: 6000 [IU] via SUBCUTANEOUS

## 2016-12-14 MED ORDER — EPOETIN ALFA 3000 UNIT/ML IJ SOLN
INTRAMUSCULAR | Status: AC
  Filled 2016-12-14: qty 2

## 2016-12-14 NOTE — Progress Notes (Signed)
Results for SHAUNTE, TUFT (MRN [id number]) as of 12/14/2016 12:37  Ref. Range 12/14/2016 12:08  Hemoglobin Latest Ref Range: 12.0 - 15.0 g/dL 9.8 (L)

## 2016-12-22 DIAGNOSIS — L240 Irritant contact dermatitis due to detergents: Secondary | ICD-10-CM | POA: Diagnosis not present

## 2016-12-22 DIAGNOSIS — N3000 Acute cystitis without hematuria: Secondary | ICD-10-CM | POA: Diagnosis not present

## 2016-12-22 DIAGNOSIS — K5901 Slow transit constipation: Secondary | ICD-10-CM | POA: Diagnosis not present

## 2016-12-28 ENCOUNTER — Encounter (HOSPITAL_COMMUNITY)
Admission: RE | Admit: 2016-12-28 | Discharge: 2016-12-28 | Disposition: A | Discharge status: Home / Self Care | Payer: 59 | Source: Ambulatory Visit | Attending: Nephrology | Admitting: Nephrology

## 2016-12-28 ENCOUNTER — Encounter (HOSPITAL_COMMUNITY): Payer: Self-pay

## 2016-12-28 ENCOUNTER — Ambulatory Visit (HOSPITAL_COMMUNITY): Payer: 59

## 2016-12-28 ENCOUNTER — Other Ambulatory Visit (HOSPITAL_COMMUNITY): Payer: Self-pay | Admitting: *Deleted

## 2016-12-28 DIAGNOSIS — N185 Chronic kidney disease, stage 5: Secondary | ICD-10-CM | POA: Diagnosis not present

## 2016-12-28 DIAGNOSIS — E538 Deficiency of other specified B group vitamins: Secondary | ICD-10-CM

## 2016-12-28 LAB — POCT HEMOGLOBIN-HEMACUE: Hemoglobin: 13.7 g/dL (ref 12.0–15.0)

## 2016-12-28 NOTE — Progress Notes (Signed)
Results for Kelli [name], Kelli [name] (MRN [id number]) as of 12/28/2016 12:56  No Procrit today, next appointment November 13th @ 1200.  Ref. Range 12/28/2016 12:06  Hemoglobin Latest Ref Range: 12.0 - 15.0 g/dL 13.7

## 2017-01-05 DIAGNOSIS — B351 Tinea unguium: Secondary | ICD-10-CM | POA: Insufficient documentation

## 2017-01-06 ENCOUNTER — Encounter (HOSPITAL_COMMUNITY): Payer: Self-pay | Admitting: *Deleted

## 2017-01-06 ENCOUNTER — Emergency Department (HOSPITAL_COMMUNITY): Payer: 59

## 2017-01-06 ENCOUNTER — Other Ambulatory Visit: Payer: Self-pay

## 2017-01-06 ENCOUNTER — Inpatient Hospital Stay (HOSPITAL_COMMUNITY)
Admission: EM | Admit: 2017-01-06 | Discharge: 2017-01-27 | DRG: 070 | Disposition: A | Discharge status: Skilled Nursing Facility | Payer: 59 | Attending: Internal Medicine | Admitting: Internal Medicine

## 2017-01-06 DIAGNOSIS — E782 Mixed hyperlipidemia: Secondary | ICD-10-CM | POA: Diagnosis present

## 2017-01-06 DIAGNOSIS — I952 Hypotension due to drugs: Secondary | ICD-10-CM | POA: Diagnosis not present

## 2017-01-06 DIAGNOSIS — D72829 Elevated white blood cell count, unspecified: Secondary | ICD-10-CM | POA: Diagnosis present

## 2017-01-06 DIAGNOSIS — E1122 Type 2 diabetes mellitus with diabetic chronic kidney disease: Secondary | ICD-10-CM | POA: Diagnosis present

## 2017-01-06 DIAGNOSIS — E872 Acidosis: Secondary | ICD-10-CM | POA: Diagnosis not present

## 2017-01-06 DIAGNOSIS — I5033 Acute on chronic diastolic (congestive) heart failure: Secondary | ICD-10-CM | POA: Diagnosis not present

## 2017-01-06 DIAGNOSIS — R0602 Shortness of breath: Secondary | ICD-10-CM | POA: Diagnosis not present

## 2017-01-06 DIAGNOSIS — X58XXXA Exposure to other specified factors, initial encounter: Secondary | ICD-10-CM | POA: Diagnosis present

## 2017-01-06 DIAGNOSIS — N182 Chronic kidney disease, stage 2 (mild): Secondary | ICD-10-CM | POA: Diagnosis not present

## 2017-01-06 DIAGNOSIS — F329 Major depressive disorder, single episode, unspecified: Secondary | ICD-10-CM | POA: Diagnosis present

## 2017-01-06 DIAGNOSIS — S8012XA Contusion of left lower leg, initial encounter: Secondary | ICD-10-CM | POA: Diagnosis present

## 2017-01-06 DIAGNOSIS — Z993 Dependence on wheelchair: Secondary | ICD-10-CM

## 2017-01-06 DIAGNOSIS — E876 Hypokalemia: Secondary | ICD-10-CM | POA: Diagnosis present

## 2017-01-06 DIAGNOSIS — J9601 Acute respiratory failure with hypoxia: Secondary | ICD-10-CM | POA: Diagnosis present

## 2017-01-06 DIAGNOSIS — J441 Chronic obstructive pulmonary disease with (acute) exacerbation: Secondary | ICD-10-CM | POA: Diagnosis present

## 2017-01-06 DIAGNOSIS — Z992 Dependence on renal dialysis: Secondary | ICD-10-CM

## 2017-01-06 DIAGNOSIS — L89152 Pressure ulcer of sacral region, stage 2: Secondary | ICD-10-CM | POA: Diagnosis present

## 2017-01-06 DIAGNOSIS — E1151 Type 2 diabetes mellitus with diabetic peripheral angiopathy without gangrene: Secondary | ICD-10-CM | POA: Diagnosis present

## 2017-01-06 DIAGNOSIS — Z8711 Personal history of peptic ulcer disease: Secondary | ICD-10-CM

## 2017-01-06 DIAGNOSIS — I5032 Chronic diastolic (congestive) heart failure: Secondary | ICD-10-CM | POA: Diagnosis present

## 2017-01-06 DIAGNOSIS — Z79899 Other long term (current) drug therapy: Secondary | ICD-10-CM | POA: Diagnosis not present

## 2017-01-06 DIAGNOSIS — E1169 Type 2 diabetes mellitus with other specified complication: Secondary | ICD-10-CM | POA: Diagnosis not present

## 2017-01-06 DIAGNOSIS — E875 Hyperkalemia: Secondary | ICD-10-CM | POA: Diagnosis present

## 2017-01-06 DIAGNOSIS — I471 Supraventricular tachycardia: Secondary | ICD-10-CM | POA: Diagnosis not present

## 2017-01-06 DIAGNOSIS — I10 Essential (primary) hypertension: Secondary | ICD-10-CM | POA: Diagnosis present

## 2017-01-06 DIAGNOSIS — I69354 Hemiplegia and hemiparesis following cerebral infarction affecting left non-dominant side: Secondary | ICD-10-CM

## 2017-01-06 DIAGNOSIS — N186 End stage renal disease: Secondary | ICD-10-CM | POA: Diagnosis not present

## 2017-01-06 DIAGNOSIS — K2970 Gastritis, unspecified, without bleeding: Secondary | ICD-10-CM | POA: Diagnosis present

## 2017-01-06 DIAGNOSIS — R7989 Other specified abnormal findings of blood chemistry: Secondary | ICD-10-CM

## 2017-01-06 DIAGNOSIS — N170 Acute kidney failure with tubular necrosis: Secondary | ICD-10-CM | POA: Diagnosis not present

## 2017-01-06 DIAGNOSIS — R072 Precordial pain: Secondary | ICD-10-CM

## 2017-01-06 DIAGNOSIS — Z66 Do not resuscitate: Secondary | ICD-10-CM | POA: Diagnosis not present

## 2017-01-06 DIAGNOSIS — Z90710 Acquired absence of both cervix and uterus: Secondary | ICD-10-CM

## 2017-01-06 DIAGNOSIS — Z452 Encounter for adjustment and management of vascular access device: Secondary | ICD-10-CM

## 2017-01-06 DIAGNOSIS — K921 Melena: Secondary | ICD-10-CM | POA: Diagnosis not present

## 2017-01-06 DIAGNOSIS — Z515 Encounter for palliative care: Secondary | ICD-10-CM

## 2017-01-06 DIAGNOSIS — M797 Fibromyalgia: Secondary | ICD-10-CM | POA: Diagnosis present

## 2017-01-06 DIAGNOSIS — J9600 Acute respiratory failure, unspecified whether with hypoxia or hypercapnia: Secondary | ICD-10-CM | POA: Diagnosis not present

## 2017-01-06 DIAGNOSIS — K449 Diaphragmatic hernia without obstruction or gangrene: Secondary | ICD-10-CM | POA: Diagnosis present

## 2017-01-06 DIAGNOSIS — M7989 Other specified soft tissue disorders: Secondary | ICD-10-CM

## 2017-01-06 DIAGNOSIS — Z950 Presence of cardiac pacemaker: Secondary | ICD-10-CM | POA: Diagnosis not present

## 2017-01-06 DIAGNOSIS — N179 Acute kidney failure, unspecified: Secondary | ICD-10-CM | POA: Diagnosis not present

## 2017-01-06 DIAGNOSIS — D62 Acute posthemorrhagic anemia: Secondary | ICD-10-CM | POA: Diagnosis not present

## 2017-01-06 DIAGNOSIS — I132 Hypertensive heart and chronic kidney disease with heart failure and with stage 5 chronic kidney disease, or end stage renal disease: Secondary | ICD-10-CM | POA: Diagnosis present

## 2017-01-06 DIAGNOSIS — N2581 Secondary hyperparathyroidism of renal origin: Secondary | ICD-10-CM | POA: Diagnosis present

## 2017-01-06 DIAGNOSIS — R778 Other specified abnormalities of plasma proteins: Secondary | ICD-10-CM | POA: Diagnosis present

## 2017-01-06 DIAGNOSIS — I509 Heart failure, unspecified: Secondary | ICD-10-CM

## 2017-01-06 DIAGNOSIS — Z888 Allergy status to other drugs, medicaments and biological substances status: Secondary | ICD-10-CM

## 2017-01-06 DIAGNOSIS — I4891 Unspecified atrial fibrillation: Secondary | ICD-10-CM | POA: Diagnosis not present

## 2017-01-06 DIAGNOSIS — Z538 Procedure and treatment not carried out for other reasons: Secondary | ICD-10-CM | POA: Diagnosis not present

## 2017-01-06 DIAGNOSIS — R0603 Acute respiratory distress: Secondary | ICD-10-CM

## 2017-01-06 DIAGNOSIS — Z7189 Other specified counseling: Secondary | ICD-10-CM

## 2017-01-06 DIAGNOSIS — G9341 Metabolic encephalopathy: Secondary | ICD-10-CM | POA: Diagnosis not present

## 2017-01-06 DIAGNOSIS — Z87891 Personal history of nicotine dependence: Secondary | ICD-10-CM

## 2017-01-06 DIAGNOSIS — Z23 Encounter for immunization: Secondary | ICD-10-CM | POA: Diagnosis not present

## 2017-01-06 DIAGNOSIS — N189 Chronic kidney disease, unspecified: Secondary | ICD-10-CM

## 2017-01-06 DIAGNOSIS — L8990 Pressure ulcer of unspecified site, unspecified stage: Secondary | ICD-10-CM

## 2017-01-06 DIAGNOSIS — I440 Atrioventricular block, first degree: Secondary | ICD-10-CM | POA: Diagnosis present

## 2017-01-06 DIAGNOSIS — N184 Chronic kidney disease, stage 4 (severe): Secondary | ICD-10-CM | POA: Diagnosis not present

## 2017-01-06 DIAGNOSIS — I639 Cerebral infarction, unspecified: Secondary | ICD-10-CM | POA: Diagnosis not present

## 2017-01-06 DIAGNOSIS — Z87440 Personal history of urinary (tract) infections: Secondary | ICD-10-CM

## 2017-01-06 DIAGNOSIS — R748 Abnormal levels of other serum enzymes: Secondary | ICD-10-CM | POA: Diagnosis not present

## 2017-01-06 DIAGNOSIS — I6300 Cerebral infarction due to thrombosis of unspecified precerebral artery: Secondary | ICD-10-CM

## 2017-01-06 DIAGNOSIS — K2990 Gastroduodenitis, unspecified, without bleeding: Secondary | ICD-10-CM | POA: Diagnosis not present

## 2017-01-06 DIAGNOSIS — N181 Chronic kidney disease, stage 1: Secondary | ICD-10-CM | POA: Diagnosis not present

## 2017-01-06 DIAGNOSIS — D631 Anemia in chronic kidney disease: Secondary | ICD-10-CM | POA: Diagnosis present

## 2017-01-06 DIAGNOSIS — R4182 Altered mental status, unspecified: Secondary | ICD-10-CM

## 2017-01-06 DIAGNOSIS — E669 Obesity, unspecified: Secondary | ICD-10-CM | POA: Diagnosis present

## 2017-01-06 DIAGNOSIS — D649 Anemia, unspecified: Secondary | ICD-10-CM

## 2017-01-06 DIAGNOSIS — Z8249 Family history of ischemic heart disease and other diseases of the circulatory system: Secondary | ICD-10-CM

## 2017-01-06 DIAGNOSIS — G459 Transient cerebral ischemic attack, unspecified: Secondary | ICD-10-CM | POA: Diagnosis not present

## 2017-01-06 DIAGNOSIS — Z833 Family history of diabetes mellitus: Secondary | ICD-10-CM

## 2017-01-06 DIAGNOSIS — H538 Other visual disturbances: Secondary | ICD-10-CM | POA: Diagnosis present

## 2017-01-06 DIAGNOSIS — R34 Anuria and oliguria: Secondary | ICD-10-CM | POA: Diagnosis not present

## 2017-01-06 DIAGNOSIS — Z6832 Body mass index (BMI) 32.0-32.9, adult: Secondary | ICD-10-CM

## 2017-01-06 DIAGNOSIS — Z794 Long term (current) use of insulin: Secondary | ICD-10-CM | POA: Diagnosis not present

## 2017-01-06 DIAGNOSIS — K2980 Duodenitis without bleeding: Secondary | ICD-10-CM | POA: Diagnosis not present

## 2017-01-06 DIAGNOSIS — T380X5A Adverse effect of glucocorticoids and synthetic analogues, initial encounter: Secondary | ICD-10-CM | POA: Diagnosis not present

## 2017-01-06 HISTORY — DX: Type 2 diabetes mellitus without complications: E11.9

## 2017-01-06 LAB — GLUCOSE, CAPILLARY: GLUCOSE-CAPILLARY: 121 mg/dL — AB (ref 65–99)

## 2017-01-06 LAB — BASIC METABOLIC PANEL
ANION GAP: 15 (ref 5–15)
BUN: 52 mg/dL — ABNORMAL HIGH (ref 6–20)
CALCIUM: 7.1 mg/dL — AB (ref 8.9–10.3)
CO2: 16 mmol/L — AB (ref 22–32)
CREATININE: 4.76 mg/dL — AB (ref 0.44–1.00)
Chloride: 105 mmol/L (ref 101–111)
GFR, EST AFRICAN AMERICAN: 10 mL/min — AB (ref >60)
GFR, EST NON AFRICAN AMERICAN: 9 mL/min — AB (ref >60)
Glucose, Bld: 144 mg/dL — ABNORMAL HIGH (ref 65–99)
Potassium: 3.4 mmol/L — ABNORMAL LOW (ref 3.5–5.1)
SODIUM: 136 mmol/L (ref 135–145)

## 2017-01-06 LAB — TROPONIN I
TROPONIN I: 0.06 ng/mL — AB (ref <0.03)
Troponin I: 0.06 ng/mL (ref <0.03)

## 2017-01-06 LAB — CBC
HCT: 22.3 % — ABNORMAL LOW (ref 36.0–46.0)
HEMOGLOBIN: 7.6 g/dL — AB (ref 12.0–15.0)
MCH: 29.6 pg (ref 26.0–34.0)
MCHC: 34.1 g/dL (ref 30.0–36.0)
MCV: 86.8 fL (ref 78.0–100.0)
PLATELETS: 304 10*3/uL (ref 150–400)
RBC: 2.57 MIL/uL — AB (ref 3.87–5.11)
RDW: 14.7 % (ref 11.5–15.5)
WBC: 11.6 10*3/uL — AB (ref 4.0–10.5)

## 2017-01-06 LAB — BRAIN NATRIURETIC PEPTIDE: B NATRIURETIC PEPTIDE 5: 966 pg/mL — AB (ref 0.0–100.0)

## 2017-01-06 LAB — LACTIC ACID, PLASMA: LACTIC ACID, VENOUS: 0.4 mmol/L — AB (ref 0.5–1.9)

## 2017-01-06 LAB — PREPARE RBC (CROSSMATCH)

## 2017-01-06 LAB — PROCALCITONIN: Procalcitonin: 0.45 ng/mL

## 2017-01-06 MED ORDER — SODIUM CHLORIDE 0.9% FLUSH: 3.0000 mL | INTRAVENOUS | Status: DC | PRN

## 2017-01-06 MED ORDER — INSULIN ASPART 100 UNIT/ML ~~LOC~~ SOLN
0.0000 [IU] | Freq: Three times a day (TID) | SUBCUTANEOUS | Status: DC
Start: 2017-01-07 — End: 2017-01-27
  Administered 2017-01-07: 3 [IU] via SUBCUTANEOUS
  Administered 2017-01-07: 2 [IU] via SUBCUTANEOUS
  Administered 2017-01-08 (×2): 3 [IU] via SUBCUTANEOUS
  Administered 2017-01-08: 5 [IU] via SUBCUTANEOUS
  Administered 2017-01-09: 3 [IU] via SUBCUTANEOUS
  Administered 2017-01-09 (×2): 11 [IU] via SUBCUTANEOUS
  Administered 2017-01-10: 3 [IU] via SUBCUTANEOUS
  Administered 2017-01-10: 8 [IU] via SUBCUTANEOUS
  Administered 2017-01-10: 2 [IU] via SUBCUTANEOUS
  Administered 2017-01-11 (×2): 5 [IU] via SUBCUTANEOUS
  Administered 2017-01-11: 3 [IU] via SUBCUTANEOUS
  Administered 2017-01-12: 11 [IU] via SUBCUTANEOUS
  Administered 2017-01-12: 3 [IU] via SUBCUTANEOUS
  Administered 2017-01-13: 8 [IU] via SUBCUTANEOUS
  Administered 2017-01-13: 3 [IU] via SUBCUTANEOUS
  Administered 2017-01-13: 5 [IU] via SUBCUTANEOUS
  Administered 2017-01-14 (×2): 2 [IU] via SUBCUTANEOUS
  Administered 2017-01-15: 3 [IU] via SUBCUTANEOUS
  Administered 2017-01-15: 8 [IU] via SUBCUTANEOUS
  Administered 2017-01-16: 2 [IU] via SUBCUTANEOUS
  Administered 2017-01-16 (×2): 3 [IU] via SUBCUTANEOUS
  Administered 2017-01-17 (×3): 2 [IU] via SUBCUTANEOUS
  Administered 2017-01-18: 3 [IU] via SUBCUTANEOUS
  Administered 2017-01-18: 1 [IU] via SUBCUTANEOUS
  Administered 2017-01-18 – 2017-01-19 (×2): 2 [IU] via SUBCUTANEOUS
  Administered 2017-01-20: 3 [IU] via SUBCUTANEOUS
  Administered 2017-01-20 – 2017-01-21 (×2): 2 [IU] via SUBCUTANEOUS
  Administered 2017-01-21: 3 [IU] via SUBCUTANEOUS
  Administered 2017-01-22 – 2017-01-25 (×8): 2 [IU] via SUBCUTANEOUS
  Administered 2017-01-25: 3 [IU] via SUBCUTANEOUS
  Administered 2017-01-25: 5 [IU] via SUBCUTANEOUS
  Administered 2017-01-26 (×2): 2 [IU] via SUBCUTANEOUS
  Administered 2017-01-26: 3 [IU] via SUBCUTANEOUS

## 2017-01-06 MED ORDER — SODIUM CHLORIDE 0.9 % IV SOLN: 250.0000 mL | INTRAVENOUS | Status: DC | PRN

## 2017-01-06 MED ORDER — VENLAFAXINE HCL ER 75 MG PO CP24
150.0000 mg | ORAL_CAPSULE | Freq: Every day | ORAL | Status: DC
  Administered 2017-01-07 – 2017-01-19 (×11): 150 mg via ORAL
  Filled 2017-01-06 (×12): qty 2

## 2017-01-06 MED ORDER — HYDRALAZINE HCL 25 MG PO TABS
50.0000 mg | ORAL_TABLET | Freq: Three times a day (TID) | ORAL | Status: DC
Start: 2017-01-06 — End: 2017-01-27
  Administered 2017-01-06 – 2017-01-27 (×50): 50 mg via ORAL
  Filled 2017-01-06 (×52): qty 2

## 2017-01-06 MED ORDER — SODIUM BICARBONATE 650 MG PO TABS
650.0000 mg | ORAL_TABLET | Freq: Three times a day (TID) | ORAL | Status: DC
  Administered 2017-01-06 – 2017-01-12 (×16): 650 mg via ORAL
  Filled 2017-01-06 (×17): qty 1

## 2017-01-06 MED ORDER — SODIUM CHLORIDE 0.9 % IV SOLN: 10.0000 mL/h | Freq: Once | INTRAVENOUS | Status: DC

## 2017-01-06 MED ORDER — TORSEMIDE 20 MG PO TABS
40.0000 mg | ORAL_TABLET | Freq: Every day | ORAL | Status: DC
  Filled 2017-01-06: qty 2

## 2017-01-06 MED ORDER — ASPIRIN EC 81 MG PO TBEC
81.0000 mg | DELAYED_RELEASE_TABLET | Freq: Every day | ORAL | Status: DC
  Administered 2017-01-07 – 2017-01-21 (×12): 81 mg via ORAL
  Filled 2017-01-06 (×12): qty 1

## 2017-01-06 MED ORDER — ENOXAPARIN SODIUM 30 MG/0.3ML ~~LOC~~ SOLN
30.0000 mg | SUBCUTANEOUS | Status: DC
  Administered 2017-01-06 – 2017-01-09 (×4): 30 mg via SUBCUTANEOUS
  Filled 2017-01-06 (×4): qty 0.3

## 2017-01-06 MED ORDER — INSULIN ASPART 100 UNIT/ML ~~LOC~~ SOLN
0.0000 [IU] | Freq: Every day | SUBCUTANEOUS | Status: DC
Start: 2017-01-06 — End: 2017-01-27
  Administered 2017-01-07: 3 [IU] via SUBCUTANEOUS
  Administered 2017-01-09 – 2017-01-10 (×2): 2 [IU] via SUBCUTANEOUS

## 2017-01-06 MED ORDER — ONDANSETRON HCL 4 MG/2ML IJ SOLN
4.0000 mg | Freq: Four times a day (QID) | INTRAMUSCULAR | Status: DC | PRN
Start: 2017-01-06 — End: 2017-01-17
  Administered 2017-01-06 – 2017-01-17 (×13): 4 mg via INTRAVENOUS
  Filled 2017-01-06 (×13): qty 2

## 2017-01-06 MED ORDER — POTASSIUM CHLORIDE CRYS ER 20 MEQ PO TBCR
40.0000 meq | EXTENDED_RELEASE_TABLET | Freq: Every day | ORAL | Status: DC
  Administered 2017-01-07 – 2017-01-10 (×4): 40 meq via ORAL
  Filled 2017-01-06 (×4): qty 2

## 2017-01-06 MED ORDER — SODIUM CHLORIDE 0.9% FLUSH
3.0000 mL | Freq: Two times a day (BID) | INTRAVENOUS | Status: DC
  Administered 2017-01-07 – 2017-01-16 (×18): 3 mL via INTRAVENOUS

## 2017-01-06 MED ORDER — ATORVASTATIN CALCIUM 40 MG PO TABS
40.0000 mg | ORAL_TABLET | Freq: Every day | ORAL | Status: DC
  Administered 2017-01-06 – 2017-01-26 (×21): 40 mg via ORAL
  Filled 2017-01-06 (×21): qty 1

## 2017-01-06 MED ORDER — ACETAMINOPHEN 325 MG PO TABS
650.0000 mg | ORAL_TABLET | ORAL | Status: DC | PRN
  Administered 2017-01-07 – 2017-01-16 (×6): 650 mg via ORAL
  Filled 2017-01-06 (×6): qty 2

## 2017-01-06 MED ORDER — CALCITRIOL 0.25 MCG PO CAPS
0.2500 ug | ORAL_CAPSULE | Freq: Every day | ORAL | Status: DC
  Administered 2017-01-07 – 2017-01-27 (×18): 0.25 ug via ORAL
  Filled 2017-01-06 (×17): qty 1

## 2017-01-06 MED ORDER — FUROSEMIDE 10 MG/ML IJ SOLN
20.0000 mg | Freq: Once | INTRAMUSCULAR | Status: AC
  Administered 2017-01-07: 20 mg via INTRAVENOUS
  Filled 2017-01-06: qty 2

## 2017-01-06 NOTE — ED Provider Notes (Signed)
[facility] EMERGENCY DEPARTMENT Provider Note   CSN: [id number] Arrival date & time: 01/06/17  1525     History   Chief Complaint Chief Complaint  Patient presents with  . Shortness of Breath  . Dysuria  . Chest Pain    HPI Kelli [name] is a 66 y.o. female.  Patient presents with a complaint of shortness of breath mild chest tightness that started last night.  Patient was swelling to both lower extremities since this morning.  Patient does have a history of CS CHF.  Patient also concerned about having dysuria.  Patient has frequent urinary tract infections.  Reviewing patient's records shows that she is been struggling with anemia since August.  Has had 2 admissions for blood transfusions.  Also been seen by hematology.  Patient currently receiving Procrit.  And gets that every 2 weeks.  Just had an injection of that last week.  So not due till next week.  When patient has had similar symptoms in the past has been due to her hematocrit being low.  They assume that the anemia is secondary to anemia of chronic renal disease.  As patient shows evidence of chronic renal failure.  [name] husband states that her bowel movements have been dark of late.  When they have checked for GI blood loss it has been negative in the past.  Patient was last seen by hematology September 20.  Records also show the patient has chronic elevated troponins.  Patient also status post CVA in the past with residual left-sided weakness.      Past Medical History:  Diagnosis Date  . Borderline diabetes   . Carotid artery occlusion    Occluded RICA, status post left CEA  August 2014 - Dr. Donnetta Hutching  . Cerebral infarction [facility]) Aug 2014   Bihemispheric watershed infarcts  . Cerebral infarction involving left cerebellar artery [facility]) Feb 2015  . CKD (chronic kidney disease) stage 3, GFR 30-59 ml/min (HCC)   . Closed dislocation of left humerus 07/26/2013  . Essential hypertension, benign   . Fibromyalgia   .  Mixed hyperlipidemia   . Multiple gastric ulcers   . Urinary incontinence     Patient Active Problem List   Diagnosis Date Noted  . B12 deficiency 11/18/2016  . Anemia in chronic kidney disease 11/18/2016  . Acute renal failure superimposed on stage 3 chronic kidney disease ([place]) 10/18/2016  . PUD (peptic ulcer disease) 10/13/2016  . Pacemaker 10/13/2016  . Acute on chronic diastolic CHF (congestive heart failure) ([place]) 10/13/2016  . Chronic renal failure 10/13/2016  . Elevated troponin   . Hypovolemic shock ([place]) 04/26/2014  . Upper GI bleed   . Leukocytosis, unspecified 07/30/2013  . Thrombocytosis ([place]) 07/30/2013  . Subluxation of left shoulder joint 07/27/2013  . CKD (chronic kidney disease) stage 2, GFR 60-89 ml/min 07/27/2013  . Closed dislocation of left humerus 07/26/2013  . Pyelonephritis 07/25/2013  . Acute on chronic renal failure ([place]) 07/25/2013  . Hyperglycemia 07/25/2013  . Normocytic anemia 07/25/2013  . Hyponatremia 07/25/2013  . Spastic hemiplegia affecting nondominant side ([place]) 07/06/2013  . 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 UTI (urinary tract infection) 06/15/2013  . Edema 05/28/2013  . Knee pain 05/28/2013  . Carotid stenosis, bilateral 05/22/2013  . Fall at nursing home 05/20/2013  . Hip pain 05/20/2013  . Pain in joint, lower leg 05/19/2013  . Chest congestion 05/12/2013  . E. coli UTI 05/07/2013  . Left shoulder pain due  to subluxation and/or adhesive capsulitis.  05/07/2013  . Acute CVA (cerebrovascular accident): R PCA infarct per MRI 04/13/13 04/13/2013  . CVA (cerebral infarction) 04/08/2013  . Acute ischemic stroke ([place]) 04/08/2013  . Diplopia 04/08/2013  . Left-sided weakness 04/08/2013  . Vertigo 04/08/2013  . Paresthesias in left hand 04/08/2013  . Pre-diabetes 04/08/2013  . Cerebellar stroke ([place]) 04/08/2013  . Dizziness and giddiness 04/08/2013  . Stroke ([place]) 04/08/2013  .  Depression 04/06/2013  . Peripheral arterial disease ([place]) 03/16/2013  . History of stroke 01/30/2013  . CKD (chronic kidney disease) stage 3, GFR 30-59 ml/min (HCC) 01/16/2013  . Anxiety state, unspecified 12/08/2012  . Lumbago 12/08/2012  . Urinary frequency 11/14/2012  . Occlusion and stenosis of carotid artery with cerebral infarction 10/05/2012  . Mixed hyperlipidemia 10/05/2012  . Renal insufficiency 10/05/2012  . HTN (hypertension) 10/04/2012  . Tobacco abuse 10/04/2012  . Obesity 10/04/2012    Past Surgical History:  Procedure Laterality Date  . COMBINED HYSTERECTOMY VAGINAL W/ MMK / A&P REPAIR  1981  . LOOP RECORDER IMPLANT  04/16/13   MDT LinQ implanted for cryptogenic stroke  . URETHRAL DILATION  1980's  . VAGINAL HYSTERECTOMY  1981   "partial" (10/04/2012)    OB History    No data available       Home Medications    Prior to Admission medications   Medication Sig Start Date End Date Taking? Authorizing Provider  amLODipine (NORVASC) 10 MG tablet Take 10 mg by mouth daily.   Yes [provider]  atorvastatin (LIPITOR) 40 MG tablet Take 1 tablet (40 mg total) by mouth daily at 6 PM. 10/20/16  Yes McClung, Kimberlee Nearing, MD  calcitRIOL (ROCALTROL) 0.25 MCG capsule Take 1 capsule (0.25 mcg total) by mouth daily. 10/20/16  Yes Cherene Altes, MD  hydrALAZINE (APRESOLINE) 50 MG tablet Take 1 tablet (50 mg total) by mouth 3 (three) times daily. 10/20/16  Yes Cherene Altes, MD  nitrofurantoin (MACRODANTIN) 100 MG capsule Take 100 mg 2 (two) times daily by mouth.   Yes [provider]  potassium chloride SA (K-DUR,KLOR-CON) 20 MEQ tablet Take 1 tablet (20 mEq total) by mouth daily. Patient taking differently: Take 40 mEq daily by mouth.  10/20/16  Yes Cherene Altes, MD  sodium bicarbonate 650 MG tablet Take 1 tablet (650 mg total) by mouth 3 (three) times daily. 10/20/16  Yes Cherene Altes, MD  torsemide (DEMADEX) 20 MG tablet Take 2 tablets (40  mg total) by mouth daily. 10/20/16  Yes Cherene Altes, MD  venlafaxine XR (EFFEXOR-XR) 150 MG 24 hr capsule Take 150 mg by mouth daily with breakfast.   Yes [provider]  metoprolol succinate (TOPROL-XL) 100 MG 24 hr tablet Take 1 tablet (100 mg total) by mouth daily. Take with or immediately following a meal. Patient not taking: Reported on 01/06/2017 10/20/16   Cherene Altes, MD    Family History Family History  Problem Relation Age of Onset  . Diabetes Mother   . Diabetes Father   . Hypertension Sister   . Diabetes Brother   . Seizures Son   . Kidney disease Maternal Grandmother   . Hypertension Maternal Grandmother   . Heart disease Maternal Grandfather   . Diabetes Paternal Grandmother   . Heart disease Paternal Grandfather     Social History Social History   Tobacco Use  . Smoking status: Former Smoker    Packs/day: 1.00    Years: 44.00  Pack years: 44.00    Types: Cigarettes    Last attempt to quit: 10/03/2012    Years since quitting: 4.2  . Smokeless tobacco: Never Used  Substance Use Topics  . Alcohol use: No    Alcohol/week: 0.0 oz  . Drug use: No     Allergies   Hydrochlorothiazide   Review of Systems Review of Systems  Constitutional: Positive for fatigue. Negative for fever.  HENT: Negative for congestion.   Eyes: Negative for visual disturbance.  Respiratory: Positive for shortness of breath.   Cardiovascular: Positive for chest pain.  Gastrointestinal: Negative for abdominal pain, blood in stool, nausea and vomiting.  Genitourinary: Positive for dysuria.  Musculoskeletal: Negative for back pain.  Skin: Positive for pallor.  Neurological: Positive for weakness and light-headedness.  Hematological: Does not bruise/bleed easily.  Psychiatric/Behavioral: Negative for confusion.     Physical Exam Updated Vital Signs BP 134/80   Pulse 97   Temp 98.3 F (36.8 C) (Oral)   Resp 17   Ht 1.651 m (5\' 5" )   Wt 83.5 kg (184 lb)    SpO2 95%   BMI 30.62 kg/m   Physical Exam  Constitutional: She is oriented to person, place, and time. She appears well-developed and well-nourished. She does not appear ill.  HENT:  Head: Normocephalic and atraumatic.  Mouth/Throat: Oropharynx is clear and moist.  Eyes: EOM are normal. Pupils are equal, round, and reactive to light.  Neck: Neck supple.  Cardiovascular: Normal rate and regular rhythm.  Pulmonary/Chest: Effort normal and breath sounds normal. She has no decreased breath sounds.  Abdominal: Soft. Bowel sounds are normal.  Genitourinary: Rectal exam shows guaiac negative stool.  Musculoskeletal:       Right lower leg: She exhibits edema.       Left lower leg: She exhibits edema.  Lower extremity edema left leg greater than right.  Neurological: She is alert and oriented to person, place, and time. She is not disoriented. No cranial nerve deficit.  Skin: Skin is warm.  Nursing note and vitals reviewed.    ED Treatments / Results  Labs (all labs ordered are listed, but only abnormal results are displayed) Labs Reviewed  BASIC METABOLIC PANEL - Abnormal; Notable for the following components:      Result Value   Potassium 3.4 (*)    CO2 16 (*)    Glucose, Bld 144 (*)    BUN 52 (*)    Creatinine, Ser 4.76 (*)    Calcium 7.1 (*)    GFR calc non Af Amer 9 (*)    GFR calc Af Amer 10 (*)    All other components within normal limits  CBC - Abnormal; Notable for the following components:   WBC 11.6 (*)    RBC 2.57 (*)    Hemoglobin 7.6 (*)    HCT 22.3 (*)    All other components within normal limits  TROPONIN I - Abnormal; Notable for the following components:   Troponin I 0.06 (*)    All other components within normal limits  BRAIN NATRIURETIC PEPTIDE - Abnormal; Notable for the following components:   B Natriuretic Peptide 966.0 (*)    All other components within normal limits  URINALYSIS, ROUTINE W REFLEX MICROSCOPIC  POC OCCULT BLOOD, ED  TYPE AND SCREEN   PREPARE RBC (CROSSMATCH)    EKG  EKG Interpretation  Date/Time:  Thursday January 06 2017 15:53:19 EST Ventricular Rate:  99 PR Interval:    QRS Duration: 92 QT  Interval:  368 QTC Calculation: 473 R Axis:   57 Text Interpretation:  Unknown rhythm, irregular rate Borderline prolonged PR interval Anteroseptal infarct, old Abnormal T, consider ischemia, lateral leads Confirmed by Fredia Sorrow [phone]) on 01/06/2017 4:27:20 PM       Radiology Dg Chest 2 View  Result Date: 01/06/2017 CLINICAL DATA:  Trouble breathing x1 day with tightness and congestion in chest. Weakness. Hx of diabetes, HTN-controlled with medication, and low hemoglobin. 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 EXAM: CHEST  2 VIEW COMPARISON:  10/19/2016 FINDINGS: The heart is enlarged. There is pulmonary vascular congestion. Prominent interstitial markings are consistent with mild edema. Trace bilateral pleural effusions are also suspected. There is mild bibasilar atelectasis. IMPRESSION: Cardiomegaly and mild pulmonary edema. Electronically Signed   By: Nolon Nations M.D.   On: 01/06/2017 16:56    Procedures Procedures (including critical care time)  CRITICAL CARE Performed by: Fredia Sorrow Total critical care time: 30 minutes Critical care time was exclusive of separately billable procedures and treating other patients. Critical care was necessary to treat or prevent imminent or life-threatening deterioration. Critical care was time spent personally by me on the following activities: development of treatment plan with patient and/or surrogate as well as nursing, discussions with consultants, evaluation of patient's response to treatment, examination of patient, obtaining history from patient or surrogate, ordering and performing treatments and interventions, ordering and review of laboratory studies, ordering and review of radiographic studies, pulse oximetry and re-evaluation of patient's  condition.   Medications Ordered in ED Medications  0.9 %  sodium chloride infusion (not administered)     Initial Impression / Assessment and Plan / ED Course  I have reviewed the triage vital signs and the nursing notes.  Pertinent labs & imaging results that were available during my care of the patient were reviewed by me and considered in my medical decision making (see chart for details).    [name] presentation today very similar to her admission in the fall.  Symptoms most likely related to the finding of her hemoglobin being in the 7 right typed in cross for 2 units of blood and transfused.  Patient does have chronic renal failure but it seems to be baseline.  Chest x-ray without any significant pulmonary edema.  Troponin is elevated but it is where it has been in the past.  Do not think the patient had an acute cardiac event.  Patient is pale I think the anemia is responsible for her symptoms very similar to previous admission.  Discussed with hospitalist they will admit.   Final Clinical Impressions(s) / ED Diagnoses   Final diagnoses:  Anemia, unspecified type  SOB (shortness of breath)  Precordial pain    ED Discharge Orders    None       Fredia Sorrow, MD 01/06/17 [phone]

## 2017-01-06 NOTE — ED Notes (Signed)
Report to Heather, RN 300 

## 2017-01-06 NOTE — H&P (Signed)
History and Physical    [name] TOI:[id number] DOB: 12-09-1950 DOA: 01/06/2017  PCP: Manon Hilding, MD Consultants:  Lowanda Foster - nephrology; Lovena Le - cardiology Patient coming from: Home - lives with husband and son; NOK: husband, [phone] ; [phone]  Chief Complaint: SOB  HPI: Kelli [name] is a 66 y.o. female with medical history significant of stroke with residual left hemiparesis, CKD stage IV, essential hypertension, diabetes mellitus, peripheral vascular disease, anemia related to CKD requiring recurrent transfusions presenting with SOB last night.  She was unable to sleep all night.  She had to sit up to try to catch her breath.  This AM, she noticed her feet were swollen.  Also with some congestion.  Mild cough, productive of clear "junk."  No fevers.  Weak/tired for months, which is unchanged recently.  She gets Procrit injection every 2 weeks and B12 shot once a month.  No chest pain other than feeling a little tight in her chest.  No further SOB during the day today.   ED Course: Presentation very similar to that in 8/18.  Symptomatic anemia, Hgb 7, will transfuse 2 units PRBC.  CKD unchanged.  CXR without any significant pulmonary edema.  Troponin elevated but stable - unlikely ACS.  Review of Systems: As per HPI; otherwise review of systems reviewed and negative.   Ambulatory Status:  Non-ambulatory  Past Medical History:  Diagnosis Date  . Carotid artery occlusion    Occluded RICA, status post left CEA  August 2014 - Dr. Donnetta Hutching  . Cerebral infarction [facility]) Aug 2014   Bihemispheric watershed infarcts  . Cerebral infarction involving left cerebellar artery [facility]) Feb 2015  . CKD (chronic kidney disease) stage 3, GFR 30-59 ml/min (HCC)   . Closed dislocation of left humerus 07/26/2013  . DM (diabetes mellitus), type 2 ([place])   . Essential hypertension, benign   . Fibromyalgia   . Mixed hyperlipidemia   . Multiple gastric ulcers   . Urinary incontinence     Past  Surgical History:  Procedure Laterality Date  . COMBINED HYSTERECTOMY VAGINAL W/ MMK / A&P REPAIR  1981  . LOOP RECORDER IMPLANT  04/16/13   MDT LinQ implanted for cryptogenic stroke  . URETHRAL DILATION  1980's  . VAGINAL HYSTERECTOMY  1981   "partial" (10/04/2012)    Social History   Socioeconomic History  . Marital status: Married    Spouse name: Not on file  . Number of children: Not on file  . Years of education: Not on file  . Highest education level: Not on file  Social Needs  . Financial resource strain: Not on file  . Food insecurity - worry: Not on file  . Food insecurity - inability: Not on file  . Transportation needs - medical: Not on file  . Transportation needs - non-medical: Not on file  Occupational History  . Not on file  Tobacco Use  . Smoking status: Former Smoker    Packs/day: 1.00    Years: 44.00    Pack years: 44.00    Types: Cigarettes    Last attempt to quit: 10/03/2012    Years since quitting: 4.2  . Smokeless tobacco: Never Used  Substance and Sexual Activity  . Alcohol use: No    Alcohol/week: 0.0 oz  . Drug use: No  . Sexual activity: Yes    Birth control/protection: Surgical  Other Topics Concern  . Not on file  Social History Narrative  . Not on file  Allergies  Allergen Reactions  . Hydrochlorothiazide Nausea And Vomiting    Family History  Problem Relation Age of Onset  . Diabetes Mother   . Diabetes Father   . Hypertension Sister   . Diabetes Brother   . Seizures Son   . Kidney disease Maternal Grandmother   . Hypertension Maternal Grandmother   . Heart disease Maternal Grandfather   . Diabetes Paternal Grandmother   . Heart disease Paternal Grandfather     Prior to Admission medications   Medication Sig Start Date End Date Taking? Authorizing Provider  amLODipine (NORVASC) 10 MG tablet Take 10 mg by mouth daily.   Yes [provider]  atorvastatin (LIPITOR) 40 MG tablet Take 1 tablet (40 mg total) by mouth  daily at 6 PM. 10/20/16  Yes McClung, Kimberlee Nearing, MD  calcitRIOL (ROCALTROL) 0.25 MCG capsule Take 1 capsule (0.25 mcg total) by mouth daily. 10/20/16  Yes Cherene Altes, MD  hydrALAZINE (APRESOLINE) 50 MG tablet Take 1 tablet (50 mg total) by mouth 3 (three) times daily. 10/20/16  Yes Cherene Altes, MD  nitrofurantoin (MACRODANTIN) 100 MG capsule Take 100 mg 2 (two) times daily by mouth.   Yes [provider]  potassium chloride SA (K-DUR,KLOR-CON) 20 MEQ tablet Take 1 tablet (20 mEq total) by mouth daily. Patient taking differently: Take 40 mEq daily by mouth.  10/20/16  Yes Cherene Altes, MD  sodium bicarbonate 650 MG tablet Take 1 tablet (650 mg total) by mouth 3 (three) times daily. 10/20/16  Yes Cherene Altes, MD  torsemide (DEMADEX) 20 MG tablet Take 2 tablets (40 mg total) by mouth daily. 10/20/16  Yes Cherene Altes, MD  venlafaxine XR (EFFEXOR-XR) 150 MG 24 hr capsule Take 150 mg by mouth daily with breakfast.   Yes [provider]  metoprolol succinate (TOPROL-XL) 100 MG 24 hr tablet Take 1 tablet (100 mg total) by mouth daily. Take with or immediately following a meal. Patient not taking: Reported on 01/06/2017 10/20/16   Cherene Altes, MD    Physical Exam: Vitals:   01/06/17 1800 01/06/17 1830 01/06/17 1900 01/06/17 1930  BP: (!) 141/79 133/67 139/65 (!) 142/82  Pulse: 96  96 (!) 102  Resp: (!) 21 16 (!) 27 19  Temp:      TempSrc:      SpO2: 95%  93% 96%  Weight:      Height:         General:  Appears calm and comfortable and is NAD; she is pale and appears to be fatigued Eyes:  PERRL, EOMI, normal lids, iris ENT:  grossly normal hearing, lips & tongue, mmm Neck:  no LAD, masses or thyromegaly Cardiovascular:  Irregularly irregular, no m/r/g. 2+ LE edema.  Respiratory:   CTA bilaterally with no wheezes/rales/rhonchi.  Normal respiratory effort. Abdomen:  soft, NT, ND, NABS Skin:  no rash or induration seen on limited  exam Musculoskeletal:  grossly normal tone BUE/BLE, no bony abnormality Psychiatric:  grossly normal mood and affect, speech fluent and appropriate, AOx3 Neurologic: CN 2-12 grossly intact, moves all extremities in coordinated fashion, sensation intact    Radiological Exams on Admission: Dg Chest 2 View  Result Date: 01/06/2017 CLINICAL DATA:  Trouble breathing x1 day with tightness and congestion in chest. Weakness. Hx of diabetes, HTN-controlled with medication, and low hemoglobin. 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 EXAM: CHEST  2 VIEW COMPARISON:  10/19/2016 FINDINGS: The heart is enlarged. There is pulmonary vascular congestion.  Prominent interstitial markings are consistent with mild edema. Trace bilateral pleural effusions are also suspected. There is mild bibasilar atelectasis. IMPRESSION: Cardiomegaly and mild pulmonary edema. Electronically Signed   By: Nolon Nations M.D.   On: 01/06/2017 16:56    EKG: Independently reviewed.  Irregular rhythm NOS with rate 99; nonspecific ST changes with no evidence of acute ischemia   Labs on Admission: I have personally reviewed the available labs and imaging studies at the time of the admission.  Pertinent labs:   Glucose 144 CO2 16; 20 on 8/22 BUN 52/Creatinine 4.76/GFR 9; 64/4.85/9 on 8/22 BNP 966; 383 on 8/15 Troponin 0.06; 0.05 on 8/15 WBC 11.6 Hgb 7.6; 7.7 on 8/22    Assessment/Plan Principal Problem:   Symptomatic anemia Active Problems:   HTN (hypertension)   Mixed hyperlipidemia   Acute on chronic renal failure (HCC)   Elevated troponin   Acute on chronic diastolic CHF (congestive heart failure) (HCC)   Diabetes mellitus type 2 in obese (HCC)   Symptomatic anemia -MCV is 80-100, indicative of normocytic anemia -This generally occurs in chronic disease and acute blood loss anemia -Patient has been thought to have anemia related to CKD in the past but also takes B12 supplementation as well as  Procrit injections -She is followed by hematology -Will admit and transfuse 2 units PRBC with Lasix in between units (see below)  CHF -Patient with prior very similar admission in 8/18 presenting with worsening SOB  -CXR consistent with mild pulmonary edema -Mildly elevated WBC count, likely reactive in nature (but UA and culture are pending) -Elevated BNP compared to prior admission -Initial EKG and auscultation indicate irregular rhythm but there do appear to be p waves on EKG and so this is not clearly afib; patient reports that she had a loop recorded implanted about a year ago and she sees Dr. Lovena Le for this issue (in 6/18, his note reported no clear afib on the ILR) -With elevated BNP and abnl CXR, CHF exacerbation seems probable as diagnosis -Her symptomatic anemia may have led to the CHF or it could be unrelated -Will admit with telemetry -Will request repeat echocardiogram - while her symptoms may be related to prior diagnosis of grade 2 diastolic dysfunction, it is reasonable to repeat the Echo at this time -Will start ASA; according to Dr. Tanna Furry 6/18 note, she is supposed to be on Plavix but it currently appears that she is not taking either antiplatelet agent. -No ACE due to renal failure -No beta blocker due to acute decompensation on presentation -CHF order set utilized; cardiology consult in AM -Will continue home Demadex and give Lasix between units; she is likely to need additional Lasix dosing in the AM -Repeat EKG in AM -Will r/o with serial troponins although doubt ACS based on symptoms  CKD -Stable since last admission but clearly stage IV now -She does not currently have indications for dialysis  DM -No home medications for this issue -Her last A1c was 5.7 -Cover with moderate-scale SSI for now  HTN -Hold Norvasc as this can worsen edema -Continue hydralazine -Hold Toprol due to CHF for now but may be able to resume tomorrow  HLD -Continue  Lipitor   DVT prophylaxis: Lovenox Code Status:  Full - confirmed with patient/family Family Communication: Husband present throughout evaluation  Disposition Plan:  Home once clinically improved Consults called: Cardiology; CM, PT, OT  Admission status: Admit - It is my clinical opinion that admission to INPATIENT is reasonable and necessary because this patient  will require at least 2 midnights in the hospital to treat this condition based on the medical complexity of the problems presented.  Given the aforementioned information, the predictability of an adverse outcome is felt to be significant.    Karmen Bongo MD [facility]  If note is complete, please contact covering daytime or nighttime physician. [email or web address] Password [email or web address]  01/06/2017, 8:01 PM

## 2017-01-06 NOTE — ED Triage Notes (Addendum)
Pt c/o difficulty breathing with mid chest tightness that started last night, swelling to bilateral lower extremities since this morning. Denies radiation of pain. Pt also c/o foul smelling urine with pain when urinating and nausea x 3 days ago. Denies urinary frequency, cough, fever. Pt reports she started taking Nitrofurantoin a few days ago when the pain with urination started. Pt already had this medication at home. No urinalysis or culture has been performed on her urine since symptoms started.

## 2017-01-07 ENCOUNTER — Inpatient Hospital Stay (HOSPITAL_COMMUNITY): Payer: 59

## 2017-01-07 DIAGNOSIS — E782 Mixed hyperlipidemia: Secondary | ICD-10-CM

## 2017-01-07 DIAGNOSIS — N184 Chronic kidney disease, stage 4 (severe): Secondary | ICD-10-CM

## 2017-01-07 DIAGNOSIS — I5033 Acute on chronic diastolic (congestive) heart failure: Secondary | ICD-10-CM

## 2017-01-07 DIAGNOSIS — J9601 Acute respiratory failure with hypoxia: Secondary | ICD-10-CM

## 2017-01-07 DIAGNOSIS — R748 Abnormal levels of other serum enzymes: Secondary | ICD-10-CM

## 2017-01-07 LAB — BASIC METABOLIC PANEL
Anion gap: 14 (ref 5–15)
BUN: 54 mg/dL — ABNORMAL HIGH (ref 6–20)
CALCIUM: 6.9 mg/dL — AB (ref 8.9–10.3)
CO2: 16 mmol/L — AB (ref 22–32)
CREATININE: 4.86 mg/dL — AB (ref 0.44–1.00)
Chloride: 103 mmol/L (ref 101–111)
GFR calc non Af Amer: 8 mL/min — ABNORMAL LOW (ref >60)
GFR, EST AFRICAN AMERICAN: 10 mL/min — AB (ref >60)
Glucose, Bld: 108 mg/dL — ABNORMAL HIGH (ref 65–99)
Potassium: 3.3 mmol/L — ABNORMAL LOW (ref 3.5–5.1)
Sodium: 133 mmol/L — ABNORMAL LOW (ref 135–145)

## 2017-01-07 LAB — GLUCOSE, CAPILLARY
GLUCOSE-CAPILLARY: 138 mg/dL — AB (ref 65–99)
GLUCOSE-CAPILLARY: 179 mg/dL — AB (ref 65–99)
Glucose-Capillary: 99 mg/dL (ref 65–99)
Glucose-Capillary: 294 mg/dL — ABNORMAL HIGH (ref 65–99)

## 2017-01-07 LAB — TROPONIN I
TROPONIN I: 0.07 ng/mL — AB (ref <0.03)
Troponin I: 0.06 ng/mL (ref <0.03)

## 2017-01-07 LAB — LACTIC ACID, PLASMA: Lactic Acid, Venous: 0.5 mmol/L (ref 0.5–1.9)

## 2017-01-07 LAB — HEMOGLOBIN AND HEMATOCRIT, BLOOD
HCT: 30.1 % — ABNORMAL LOW (ref 36.0–46.0)
Hemoglobin: 10.1 g/dL — ABNORMAL LOW (ref 12.0–15.0)

## 2017-01-07 LAB — LACTATE DEHYDROGENASE: LDH: 174 U/L (ref 98–192)

## 2017-01-07 MED ORDER — FUROSEMIDE 10 MG/ML IJ SOLN
60.0000 mg | Freq: Two times a day (BID) | INTRAMUSCULAR | Status: DC
  Administered 2017-01-07 – 2017-01-09 (×4): 60 mg via INTRAVENOUS
  Filled 2017-01-07 (×4): qty 6

## 2017-01-07 MED ORDER — METHYLPREDNISOLONE SODIUM SUCC 125 MG IJ SOLR
INTRAMUSCULAR | Status: AC
  Administered 2017-01-07: 11:00:00
  Filled 2017-01-07: qty 2

## 2017-01-07 MED ORDER — FUROSEMIDE 10 MG/ML IJ SOLN
40.0000 mg | Freq: Two times a day (BID) | INTRAMUSCULAR | Status: DC
  Administered 2017-01-07: 40 mg via INTRAVENOUS
  Filled 2017-01-07: qty 4

## 2017-01-07 MED ORDER — METHYLPREDNISOLONE SODIUM SUCC 125 MG IJ SOLR
60.0000 mg | Freq: Three times a day (TID) | INTRAMUSCULAR | Status: DC
  Administered 2017-01-07 – 2017-01-11 (×13): 60 mg via INTRAVENOUS
  Filled 2017-01-07 (×14): qty 2

## 2017-01-07 MED ORDER — INFLUENZA VAC SPLIT HIGH-DOSE 0.5 ML IM SUSY
0.5000 mL | PREFILLED_SYRINGE | INTRAMUSCULAR | Status: AC
  Administered 2017-01-09: 0.5 mL via INTRAMUSCULAR
  Filled 2017-01-07: qty 0.5

## 2017-01-07 MED ORDER — METHYLPREDNISOLONE SODIUM SUCC 125 MG IJ SOLR
125.0000 mg | Freq: Once | INTRAMUSCULAR | Status: AC
  Administered 2017-01-07: 125 mg via INTRAVENOUS

## 2017-01-07 MED ORDER — IPRATROPIUM-ALBUTEROL 0.5-2.5 (3) MG/3ML IN SOLN
3.0000 mL | Freq: Four times a day (QID) | RESPIRATORY_TRACT | Status: DC
  Administered 2017-01-07 – 2017-01-09 (×8): 3 mL via RESPIRATORY_TRACT
  Filled 2017-01-07 (×8): qty 3

## 2017-01-07 NOTE — Progress Notes (Signed)
PROGRESS NOTE  Kelli [name] GGE:[id number] DOB: [birth year] DOA: 01/06/2017 PCP: Manon Hilding, MD  Brief History:  66 year old female with a history of stroke with residual left hemiparesis, CKD stage III, essential hypertension, diabetes mellitus, peripheral vascular disease presented with 1 day history of shortness of breath, worsening leg edema, and chest discomfort.  The patient also complains of a cough with clear sputum.  She had subjective fevers and chills, but denied any headache, neck pain.  The patient has chronic nausea and vomiting each morning which has been unchanged.  She denies any hematemesis, hematochezia, melena.  She complains of intermittent abdominal pain with loose stools.  There is no dysuria, hematuria.  Upon presentation, the patient was noted to have a hemoglobin of 7.6.  Chest x-ray showed pulmonary edema with BNP 966.  Assessment/Plan: Acute on chronic diastolic CHF -daily weights -accurate I/O's -fluid restrict -10/14/16 Echo--EF 55-60%, grade 2 DD, mild MR -start IV Lasix 40 mg bid -remains clinically fluid overloaded  Acute respiratory failure with hypoxia -Secondary to CHF -Presently stable on 2 L -Wean oxygen for saturation greater than 92%  Symptomatic anemia -December 28, 2016 hemoglobin 13.7 -Check LDH and haptoglobin -FOBT negative -CT abdomen pelvis--rule out retroperitoneal bleed  Elevated troponin -Troponin trend flat -Secondary to CKD and decompensated CHF -pt also has loop recorder placed Feb 2015 after stroke  CKD stage IV -Baseline creatinine 4.5-4.8 -Monitor BMP with diuresis -ANA 1:160 but complements and dsDNA negative  Essential hypertension -Continue metoprolol succinate -Continue hydralazine  Hx of Stroke with left hemiparesis  -continue ASA -check lipid panel--LDL57 -pt has loop recorder placed Feb 2015 -continue statin -PT eval--pt refuses SNF   Diabetes mellitus type 2, controlled -October 15, 2016  hemoglobin A1c 5.7 -NovoLog sliding scale  Hyperlipidemia -Continue statin    Disposition Plan:   Home in 2-3 days  Family Communication: Spouse update at bedside 11/9--Total time spent 35 minutes.  Greater than 50% spent face to face counseling and coordinating care.   Consultants:  cardiology  Code Status:  FULL  DVT Prophylaxis:  [place] Lovenox   Procedures: As Listed in Progress Note Above  Antibiotics: None    Subjective: Patient denies fevers, chills, headache, chest pain, diarrhea, abdominal pain, dysuria, hematuria, hematochezia, and melena.   Objective: Vitals:   01/07/17 0042 01/07/17 0120 01/07/17 0352 01/07/17 0500  BP: 134/72 131/63 129/66   Pulse: (!) 101 (!) 101 (!) 102   Resp: 18 18 17    Temp: 98.7 F (37.1 C) 98.2 F (36.8 C) 98.3 F (36.8 C)   TempSrc: Oral Oral Oral   SpO2: 92% 93% 95%   Weight:    83 kg (182 lb 15.7 oz)  Height:        Intake/Output Summary (Last 24 hours) at 01/07/2017 0841 Last data filed at 01/07/2017 0035 Gross per 24 hour  Intake 315 ml  Output -  Net 315 ml   Weight change:  Exam:   General:  Pt is alert, follows commands appropriately, not in acute distress  HEENT: No icterus, No thrush, No neck mass, [place]/AT  Cardiovascular: RRR, S1/S2, no rubs, no gallops+JVD  Respiratory: Bibasilar crackles.  No wheeze  Abdomen: Soft/+BS, non tender, non distended, no guarding  Extremities: 1 + LE edema, No lymphangitis, No petechiae, No rashes, no synovitis   Data Reviewed: I have personally reviewed following labs and imaging studies Basic Metabolic Panel: Recent Labs  Lab 01/06/17 1607 01/07/17 0630  NA 136 133*  K 3.4* 3.3*  CL 105 103  CO2 16* 16*  GLUCOSE 144* 108*  BUN 52* 54*  CREATININE 4.76* 4.86*  CALCIUM 7.1* 6.9*   Liver Function Tests: No results for input(s): AST, ALT, ALKPHOS, BILITOT, PROT, ALBUMIN in the last 168 hours. No results for input(s): LIPASE, AMYLASE in the last 168 hours. No  results for input(s): AMMONIA in the last 168 hours. Coagulation Profile: No results for input(s): INR, PROTIME in the last 168 hours. CBC: Recent Labs  Lab 01/06/17 1607 01/07/17 0630  WBC 11.6*  --   HGB 7.6* 10.1*  HCT 22.3* 30.1*  MCV 86.8  --   PLT 304  --    Cardiac Enzymes: Recent Labs  Lab 01/06/17 1607 01/06/17 2109 01/07/17 0630  TROPONINI 0.06* 0.06* 0.07*   BNP: Invalid input(s): POCBNP CBG: Recent Labs  Lab 01/06/17 2026 01/07/17 0728  GLUCAP 121* 99   HbA1C: No results for input(s): HGBA1C in the last 72 hours. Urine analysis:    Component Value Date/Time   COLORURINE YELLOW 10/13/2016 0640   APPEARANCEUR HAZY (A) 10/13/2016 0640   LABSPEC 1.009 10/13/2016 0640   PHURINE 5.0 10/13/2016 0640   GLUCOSEU 50 (A) 10/13/2016 0640   HGBUR NEGATIVE 10/13/2016 0640   BILIRUBINUR NEGATIVE 10/13/2016 0640   KETONESUR NEGATIVE 10/13/2016 0640   PROTEINUR 100 (A) 10/13/2016 0640   UROBILINOGEN 0.2 04/25/2014 2315   NITRITE NEGATIVE 10/13/2016 0640   LEUKOCYTESUR LARGE (A) 10/13/2016 0640   Sepsis Labs: @LABRCNTIP (procalcitonin:4,lacticidven:4) )No results found for this or any previous visit (from the past 240 hour(s)).   Scheduled Meds: . aspirin EC  81 mg Oral Daily  . atorvastatin  40 mg Oral q1800  . calcitRIOL  0.25 mcg Oral Daily  . enoxaparin (LOVENOX) injection  30 mg Subcutaneous Q24H  . hydrALAZINE  50 mg Oral TID  . [START ON 01/08/2017] Influenza vac split quadrivalent PF  0.5 mL Intramuscular Tomorrow-1000  . insulin aspart  0-15 Units Subcutaneous TID WC  . insulin aspart  0-5 Units Subcutaneous QHS  . potassium chloride SA  40 mEq Oral Daily  . sodium bicarbonate  650 mg Oral TID  . sodium chloride flush  3 mL Intravenous Q12H  . torsemide  40 mg Oral Daily  . venlafaxine XR  150 mg Oral Q breakfast   Continuous Infusions: . sodium chloride    . sodium chloride      Procedures/Studies: Dg Chest 2 View  Result Date:  01/06/2017 CLINICAL DATA:  Trouble breathing x1 day with tightness and congestion in chest. Weakness. Hx of diabetes, HTN-controlled with medication, and low hemoglobin. 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 EXAM: CHEST  2 VIEW COMPARISON:  10/19/2016 FINDINGS: The heart is enlarged. There is pulmonary vascular congestion. Prominent interstitial markings are consistent with mild edema. Trace bilateral pleural effusions are also suspected. There is mild bibasilar atelectasis. IMPRESSION: Cardiomegaly and mild pulmonary edema. Electronically Signed   By: Nolon Nations M.D.   On: 01/06/2017 16:56    Vyla Pint, DO  [facility] Pager [phone]  If 7PM-7AM, please contact night-coverage [email or web address] Password [email or web address] 01/07/2017, 8:41 AM   LOS: 1 day

## 2017-01-07 NOTE — Progress Notes (Signed)
   Progress Note  Patient Name: Kelli [name] Date of Encounter: 01/07/2017  Spoke with [employer] representative who interrogated patient's ILR.  There were no definitive atrial fibrillation episodes, device detected heart rate irregularity and tachycardia which were likely related to undersensing and PVCs.  This had been seen previously as well.  Would suggest keeping her on telemetry while hospitalized to get a better sense of rhythm.  Signed, Rozann Lesches, MD  01/07/2017, 2:46 PM

## 2017-01-07 NOTE — Progress Notes (Signed)
OT Cancellation Note  Patient Details Name: Kelli [name] MRN: [id number] DOB: 1951-01-21   Cancelled Treatment:    Reason Eval/Treat Not Completed: Medical issues which prohibited therapy. OT unable to complete evaluation this am due to active bedrest order and pt refusal to complete bed level evaluation. Pt reports husband provides heavy assistance with all ADLs at baseline, she uses wheelchair for functional mobility at home and has a hospital bed. After previous admission in August 2018, pt received HHPT services, unable to remember how long she received those services. Will attempt evaluation at a later time.   [name], OTR/L  [phone] 01/07/2017, 7:47 AM

## 2017-01-07 NOTE — Care Management Note (Signed)
Case Management Note  Patient Details  Name: Kelli [name] MRN: [id number] Date of Birth: 02-Sep-1950  Subjective/Objective:         Admitted with anemia. Pt is from home, lives with husband. She requires assistance with all ADL's. Her husband helps and her sister comes over during the day. She has WC and hospital bed. She is on oxygen acutely. Pt has had HH in the past through [facility] and would like to use them again if needed. Says she does not want to go to a nursing home. Very weak and closes her eyes multiple times throughout conversation.            Action/Plan: CM will cont to follow.   Expected Discharge Date:     01/10/2017             Expected Discharge Plan:  [place]  In-House Referral:  NA  Discharge planning Services  CM Consult  Post Acute Care Choice:    Choice offered to:     DME Arranged:    DME Agency:     HH Arranged:    HH Agency:     Status of Service:  In process, will continue to follow  If discussed at Long Length of Stay Meetings, dates discussed:    Additional Comments:  Sherald Barge, RN 01/07/2017, 1:19 PM

## 2017-01-07 NOTE — Progress Notes (Signed)
PT Cancellation Note  Patient Details Name: Kelli [name] MRN: [id number] DOB: [birth year]   Cancelled Treatment:    Reason Eval/Treat Not Completed: Patient declined, no reason specified.  Patient declined therapy secondary to c/o fatigue and difficulty breathing after having many procedures today.   3:42 PM, 01/07/17 Lonell Grandchild, MPT Physical Therapist with [facility] [phone] office [phone] mobile phone

## 2017-01-07 NOTE — Consult Note (Signed)
Cardiology Consultation:   Patient ID: Kelli [name]; [id number]; 11-Jun-1950   Admit date: 01/06/2017 Date of Consult: 01/07/2017  Primary Care Provider: Manon Hilding, MD Primary Cardiologist: Cristopher Peru MD  Patient Profile:   Kelli [name] is a 66 y.o. female with a hx of cryptogenic CVA with left sided hemiparesis, status post ILR ([employer]) followed by Dr. Cristopher Peru for evaluation of atrial fibrillation, hypertension, type II diabetes, carotid artery disease status post left CEA with occluded RICA, chronic kidney disease stage III, hyperlipidemia, with other history to include fibromyalgia, and multiple gastric ulcers who is being seen today for the evaluation of chest pain and CHF at the request of Dr. Carles Collet.  History of Present Illness:   Kelli [name] presented to the emergency room with complaints of dyspnea, lower extremity edema, lung congestion, fatigue, PND and orthopnea worsening over the last 3 weeks. She also got her legs caught in hospital bed at home with some injury and bruising to left lower extremity. Shortness of breath has been worse in the last few days.  On arrival to the emergency room she was found to be anemic with a hemoglobin of 7.5, hematocrit of 22.3, she was also found to be hypokalemic with a potassium of 3.4, creatinine 4.76.  Initial vital signs demonstrated a blood pressure 155/95, heart rate 82, O2 sat 94%, she was afebrile.  EKG revealed sinus tachycardia with frequent PACs, inferior T wave abnormality.  Other pertinent labs revealed elevated white blood cells at 11.6; BNP 966.0.  Troponin 0.06 (during recent hospitalization in August 2018 troponin was 0.05 and 0.06 throughout hospitalization.).Chest x-ray revealed cardiomegaly with mild pulmonary edema.  She has been placed on Lasix 40 mg twice daily but no urine output has been recorded since admission.   She was admitted in August of 2018 for similar complaints in the setting of anemia and CHF. She was  diuresed and followed by nephrology at that time. She received 2 units of PRBC's during that past admission. She was taken off of clonidine, metformin, and simvastatin. She had been on Plavix when seen last by Dr. Lovena Le in June, but was not on medication on recent admission, nor was this restarted after recent hospitalization.   Past Medical History:  Diagnosis Date  . Carotid artery occlusion    Occluded RICA, status post left CEA  August 2014 - Dr. Donnetta Hutching  . Cerebral infarction [facility]) Aug 2014   Bihemispheric watershed infarcts  . Cerebral infarction involving left cerebellar artery [facility]) Feb 2015  . CKD (chronic kidney disease) stage 3, GFR 30-59 ml/min (HCC)   . Closed dislocation of left humerus 07/26/2013  . DM (diabetes mellitus), type 2 ([place])   . Essential hypertension, benign   . Fibromyalgia   . Mixed hyperlipidemia   . Multiple gastric ulcers   . Urinary incontinence     Past Surgical History:  Procedure Laterality Date  . COMBINED HYSTERECTOMY VAGINAL W/ MMK / A&P REPAIR  1981  . LOOP RECORDER IMPLANT  04/16/13   MDT LinQ implanted for cryptogenic stroke  . URETHRAL DILATION  1980's  . VAGINAL HYSTERECTOMY  1981   "partial" (10/04/2012)     Home Medications:  Prior to Admission medications   Medication Sig Start Date End Date Taking? Authorizing Provider  amLODipine (NORVASC) 10 MG tablet Take 10 mg by mouth daily.   Yes [provider]  atorvastatin (LIPITOR) 40 MG tablet Take 1 tablet (40 mg total) by mouth daily at 6 PM.  10/20/16  Yes Cherene Altes, MD  calcitRIOL (ROCALTROL) 0.25 MCG capsule Take 1 capsule (0.25 mcg total) by mouth daily. 10/20/16  Yes Cherene Altes, MD  hydrALAZINE (APRESOLINE) 50 MG tablet Take 1 tablet (50 mg total) by mouth 3 (three) times daily. 10/20/16  Yes Cherene Altes, MD  nitrofurantoin (MACRODANTIN) 100 MG capsule Take 100 mg 2 (two) times daily by mouth.   Yes [provider]  potassium chloride SA  (K-DUR,KLOR-CON) 20 MEQ tablet Take 1 tablet (20 mEq total) by mouth daily. Patient taking differently: Take 40 mEq daily by mouth.  10/20/16  Yes Cherene Altes, MD  sodium bicarbonate 650 MG tablet Take 1 tablet (650 mg total) by mouth 3 (three) times daily. 10/20/16  Yes Cherene Altes, MD  torsemide (DEMADEX) 20 MG tablet Take 2 tablets (40 mg total) by mouth daily. 10/20/16  Yes Cherene Altes, MD  venlafaxine XR (EFFEXOR-XR) 150 MG 24 hr capsule Take 150 mg by mouth daily with breakfast.   Yes [provider]  metoprolol succinate (TOPROL-XL) 100 MG 24 hr tablet Take 1 tablet (100 mg total) by mouth daily. Take with or immediately following a meal. Patient not taking: Reported on 01/06/2017 10/20/16   Cherene Altes, MD    Inpatient Medications: Scheduled Meds: . aspirin EC  81 mg Oral Daily  . atorvastatin  40 mg Oral q1800  . calcitRIOL  0.25 mcg Oral Daily  . enoxaparin (LOVENOX) injection  30 mg Subcutaneous Q24H  . furosemide  40 mg Intravenous BID  . hydrALAZINE  50 mg Oral TID  . [START ON 01/08/2017] Influenza vac split quadrivalent PF  0.5 mL Intramuscular Tomorrow-1000  . insulin aspart  0-15 Units Subcutaneous TID WC  . insulin aspart  0-5 Units Subcutaneous QHS  . potassium chloride SA  40 mEq Oral Daily  . sodium bicarbonate  650 mg Oral TID  . sodium chloride flush  3 mL Intravenous Q12H  . venlafaxine XR  150 mg Oral Q breakfast   Continuous Infusions: . sodium chloride    . sodium chloride     PRN Meds: sodium chloride, acetaminophen, ondansetron (ZOFRAN) IV, sodium chloride flush  Allergies:    Allergies  Allergen Reactions  . Hydrochlorothiazide Nausea And Vomiting    Social History:   Social History   Socioeconomic History  . Marital status: Married    Spouse name: Not on file  . Number of children: Not on file  . Years of education: Not on file  . Highest education level: Not on file  Social Needs  . Financial resource  strain: Not on file  . Food insecurity - worry: Not on file  . Food insecurity - inability: Not on file  . Transportation needs - medical: Not on file  . Transportation needs - non-medical: Not on file  Occupational History  . Not on file  Tobacco Use  . Smoking status: Former Smoker    Packs/day: 1.00    Years: 44.00    Pack years: 44.00    Types: Cigarettes    Last attempt to quit: 10/03/2012    Years since quitting: 4.2  . Smokeless tobacco: Never Used  Substance and Sexual Activity  . Alcohol use: No    Alcohol/week: 0.0 oz  . Drug use: No  . Sexual activity: Yes    Birth control/protection: Surgical  Other Topics Concern  . Not on file  Social History Narrative  . Not on file  Family History:    Family History  Problem Relation Age of Onset  . Diabetes Mother   . Diabetes Father   . Hypertension Sister   . Diabetes Brother   . Seizures Son   . Kidney disease Maternal Grandmother   . Hypertension Maternal Grandmother   . Heart disease Maternal Grandfather   . Diabetes Paternal Grandmother   . Heart disease Paternal Grandfather      ROS:  Please see the history of present illness.  ROS  All other ROS reviewed and negative.     Physical Exam/Data:   Vitals:   01/07/17 0042 01/07/17 0120 01/07/17 0352 01/07/17 0500  BP: 134/72 131/63 129/66   Pulse: (!) 101 (!) 101 (!) 102   Resp: 18 18 17    Temp: 98.7 F (37.1 C) 98.2 F (36.8 C) 98.3 F (36.8 C)   TempSrc: Oral Oral Oral   SpO2: 92% 93% 95%   Weight:    182 lb 15.7 oz (83 kg)  Height:        Intake/Output Summary (Last 24 hours) at 01/07/2017 0909 Last data filed at 01/07/2017 0035 Gross per 24 hour  Intake 315 ml  Output -  Net 315 ml   Filed Weights   01/06/17 1537 01/06/17 2015 01/07/17 0500  Weight: 184 lb (83.5 kg) 183 lb 3.2 oz (83.1 kg) 182 lb 15.7 oz (83 kg)   Body mass index is 30.45 kg/m.   General:  Overweight woman, mildly anxious.  HEENT: normal Lymph: no adenopathy Neck:  Mild JVD Endocrine:  No thryomegaly Vascular: No carotid bruits; FA pulses 2+ bilaterally without bruits  Cardiac: RRR; soft systolic murmur.  Lungs: Diminished in the bases, with expiratory wheezes, some rales.  Abd: soft, nontender, no hepatomegaly  Ext: no edema Musculoskeletal:  No deformities, BUE and BLE strength normal and equal. Ecchymosis noted on the left LE pretibial.  Skin: warm and dry  Neuro:  CNs 2-12 intact, no focal abnormalities noted Psych:  Anxious   EKG:  The EKG was personally reviewed and demonstrates:  SR with frequent PAC';s.   Telemetry:  Telemetry was personally reviewed and demonstrates:  SR, frequent PAC's one episode of SVT/atrial tachycardia rate up to 137 bpm.   Relevant CV Studies:  Echocardiogram 10/14/2016 Left ventricle: The cavity size was normal. Wall thickness was   increased in a pattern of severe LVH. Systolic function was   normal. The estimated ejection fraction was in the range of 55%   to 60%. Wall motion was normal; there were no regional wall   motion abnormalities. Features are consistent with a pseudonormal   left ventricular filling pattern, with concomitant abnormal   relaxation and increased filling pressure (grade 2 diastolic   dysfunction). Doppler parameters are consistent with high   ventricular filling pressure. - Mitral valve: Calcified annulus. Normal thickness leaflets .   There was mild regurgitation. - Left atrium: The atrium was mildly dilated. - Pericardium, extracardiac: Small posterior pericardial effusion   adjacent to right atrium. No tamponade physiology.  ILR interrogation 08/19/2016 Conclusion   Loop check in clinic. Battery status: RRT since 07/15/16. R-waves 0.66mV. 0 symptom episodes, (2) tachy episodes--(1) WCT (previously reviewed) and (1) TWOs, (92) pause episodes--undersensing, 0 brady episodes. (20) "AF" episodes (<0.1% burden)--max dur.  79mins, oversensing/undersensing, PACs w/ST. ROV with GT/R in 12  months    Laboratory Data:  Chemistry Recent Labs  Lab 01/06/17 1607 01/07/17 0630  NA 136 133*  K 3.4* 3.3*  CL 105  103  CO2 16* 16*  GLUCOSE 144* 108*  BUN 52* 54*  CREATININE 4.76* 4.86*  CALCIUM 7.1* 6.9*  GFRNONAA 9* 8*  GFRAA 10* 10*  ANIONGAP 15 14    Hematology Recent Labs  Lab 01/06/17 1607 01/07/17 0630  WBC 11.6*  --   RBC 2.57*  --   HGB 7.6* 10.1*  HCT 22.3* 30.1*  MCV 86.8  --   MCH 29.6  --   MCHC 34.1  --   RDW 14.7  --   PLT 304  --    Cardiac Enzymes Recent Labs  Lab 01/06/17 1607 01/06/17 2109 01/07/17 0630  TROPONINI 0.06* 0.06* 0.07*   No results for input(s): TROPIPOC in the last 168 hours.  BNP Recent Labs  Lab 01/06/17 1655  BNP 966.0*    Radiology/Studies:  Dg Chest 2 View  Result Date: 01/06/2017 CLINICAL DATA:  Trouble breathing x1 day with tightness and congestion in chest. Weakness. Hx of diabetes, HTN-controlled with medication, and low hemoglobin. 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 EXAM: CHEST  2 VIEW COMPARISON:  10/19/2016 FINDINGS: The heart is enlarged. There is pulmonary vascular congestion. Prominent interstitial markings are consistent with mild edema. Trace bilateral pleural effusions are also suspected. There is mild bibasilar atelectasis. IMPRESSION: Cardiomegaly and mild pulmonary edema. Electronically Signed   By: Nolon Nations M.D.   On: 01/06/2017 16:56    Assessment and Plan:   1. Acute on Chronic diastolic heart failure: Recent echocardiogram 09/2016 demonstrated normal LVEF. Continue IV diureses, she is currently receiving lasix 40 mg IV BID. She will need higher doses as she has not been diuresing. Will increase lasix to 80 mg BID watching renal status closely. (On last admission she was receiveing doses of 120 mg BID)  Creatinine is elevated at 4.86, but stable when compared to creatinine on 10/20/2016.   2. Irregular Heart Rhythm: Now in NSR with frequent PAC with one episode of  atrial tachycardia. Question MAT on EKG. She is not on currently on anticoagulation at home. She has [employer] ILR. Last checked in June of 2018. Will have [employer] interrogate during admission. Apparently remote transmission has not been working properly.   3. Chronic Renal Insufficiency: Creatinine is stable but remains >4. Has been seen by Dr. Hinda Lenis in the past. Consider having him see her again during this admission. She has not yet been planned for hemodialysis, although this was considered during hospitalization in August. .   4. Anemia: Hgb of 7.6 on admission. Now improved to 10.1 this am after 1 unit of PRBC';s No active bleeding   5. Diabetes: On insulin. Metformin stopped due to renal insufficiency.   6. Hx of Cryptogenic CVA: Was recommended to restart ASA and Plavix during last admission in 09/2016 She is no longer taking this as it was not ordered on recent hospitalization. If there is no active bleeding found, would restart this for prophylaxis of recurrent CVA.   For questions or updates, please contact [place] Please consult [email or web address] for contact info under Cardiology/STEMI.   Signed, Jory Sims DNP, ANP, AACC   01/07/2017 9:09 AM    Attending note:  Patient seen and examined. Reviewed records and discussed the case with Kelli [name]. Kelli [name] presents with increasing shortness of breath and chest congestion, intermittent cough, atypical chest discomfort over the last few weeks, worse in the last 2 days. She also reports leg swelling. She states that she's been taking her medications regularly. She does not report  any palpitations.  On examination she reports chest congestion, no chest pain. Heart rate is around 100 in sinus rhythm with frequent PACs and a burst of SVT noted by telemetry which I personally reviewed. Rare PVCs. Systolic blood pressure in the 120s. Lungs exhibit diffuse rhonchi also scattered crackles and prolonged expiratory phase.  Cardiac exam reveals largely regular rhythm with ectopy, soft systolic murmur, no gallop. She has 1-2+ ankle edema.  Lab work shows creatinine 4.86, potassium 3.3, BNP 966, troponin I 0.07, hemoglobin 10.1. I personally reviewed her ECG from 01/06/2017 which shows sinus rhythm with PACs, prolonged PR interval, poor R-wave progression, probable repolarization abnormalities. Chest x-ray reports bilateral multifocal airspace process most notable over the left midlung and right upper lobe suggesting multifocal infection.  Patient presents for shortness of breath, chest congestion, intermittent cough. Likely has component of acute on chronic diastolic heart failure, although infection is not excluded based on chest x-ray results. She is presently afebrile. Would continue with IV Lasix, adjusting for adequate urine output in the face of advanced a CKD. Would also get [employer] ILR interrogated. Change Lasix to 80 mg IV twice daily. Replete potassium. Follow-up echocardiogram is pending.  Satira Sark, M.D., F.A.C.C.'

## 2017-01-07 NOTE — Care Management Important Message (Signed)
Important Message  Patient Details  Name: Kelli [name] MRN: [id number] Date of Birth: April 01, 1950   Medicare Important Message Given:  Yes    Sherald Barge, RN 01/07/2017, 1:18 PM

## 2017-01-07 NOTE — Progress Notes (Signed)
Pt's labs had to be moved due to pt receiving 1 of 2 units of blood, lab unable to draw until 2 hours after transfusion completed, labs rescheduled for 0700.

## 2017-01-07 NOTE — Progress Notes (Signed)
CRITICAL VALUE ALERT  Critical Value:  Triponin 0.07  Date & Time Notied:01/07/2017 0740  Provider Notified: Tat  Orders Received/Actions taken: Confirmed. No new orders

## 2017-01-08 DIAGNOSIS — R072 Precordial pain: Secondary | ICD-10-CM

## 2017-01-08 LAB — BASIC METABOLIC PANEL
Anion gap: 13 (ref 5–15)
BUN: 72 mg/dL — AB (ref 6–20)
CO2: 17 mmol/L — ABNORMAL LOW (ref 22–32)
Calcium: 6.9 mg/dL — ABNORMAL LOW (ref 8.9–10.3)
Chloride: 102 mmol/L (ref 101–111)
Creatinine, Ser: 5.01 mg/dL — ABNORMAL HIGH (ref 0.44–1.00)
GFR calc Af Amer: 9 mL/min — ABNORMAL LOW (ref >60)
GFR, EST NON AFRICAN AMERICAN: 8 mL/min — AB (ref >60)
Glucose, Bld: 214 mg/dL — ABNORMAL HIGH (ref 65–99)
POTASSIUM: 3.4 mmol/L — AB (ref 3.5–5.1)
SODIUM: 132 mmol/L — AB (ref 135–145)

## 2017-01-08 LAB — BPAM RBC
BLOOD PRODUCT EXPIRATION DATE: 201812072359
BLOOD PRODUCT EXPIRATION DATE: 201812082359
ISSUE DATE / TIME: 201811090048
ISSUE DATE / TIME: 201811082040
UNIT TYPE AND RH: 600
Unit Type and Rh: 600

## 2017-01-08 LAB — TYPE AND SCREEN
ABO/RH(D): A NEG
Antibody Screen: NEGATIVE
Unit division: 0
Unit division: 0

## 2017-01-08 LAB — GLUCOSE, CAPILLARY
GLUCOSE-CAPILLARY: 198 mg/dL — AB (ref 65–99)
GLUCOSE-CAPILLARY: 242 mg/dL — AB (ref 65–99)
Glucose-Capillary: 179 mg/dL — ABNORMAL HIGH (ref 65–99)
Glucose-Capillary: 177 mg/dL — ABNORMAL HIGH (ref 65–99)

## 2017-01-08 LAB — HAPTOGLOBIN: HAPTOGLOBIN: 357 mg/dL — AB (ref 34–200)

## 2017-01-08 LAB — PROCALCITONIN: Procalcitonin: 0.39 ng/mL

## 2017-01-08 MED ORDER — METOPROLOL SUCCINATE ER 50 MG PO TB24
100.0000 mg | ORAL_TABLET | Freq: Every day | ORAL | Status: DC
  Administered 2017-01-08 – 2017-01-13 (×5): 100 mg via ORAL
  Filled 2017-01-08 (×5): qty 2

## 2017-01-08 NOTE — Progress Notes (Signed)
PT Cancellation Note  Patient Details Name: Kelli [name] MRN: [id number] DOB: April 09, 1950   Cancelled Treatment:    Reason Eval/Treat Not Completed: Other (comment)/  Eating dinner and will try again tomorrow.   Ramond Dial 01/08/2017, 4:52 PM   4:52 PM, 01/08/17 Mee Hives, PT, MS Physical Therapist - [place] [phone] (Office)

## 2017-01-08 NOTE — Progress Notes (Signed)
PROGRESS NOTE  Kelli [name] PPI:[id number] DOB: Sep 02, 1950 DOA: 01/06/2017 PCP: Manon Hilding, MD  Brief History:  66 year old female with a history of stroke with residual left hemiparesis, CKD stage III, essential hypertension, diabetes mellitus, peripheral vascular disease presented with 1 day history of shortness of breath, worsening leg edema, and chest discomfort.  The patient also complains of a cough with clear sputum.  She had subjective fevers and chills, but denied any headache, neck pain.  The patient has chronic nausea and vomiting each morning which has been unchanged.  She denies any hematemesis, hematochezia, melena.  She complains of intermittent abdominal pain with loose stools.  There is no dysuria, hematuria.  Upon presentation, the patient was noted to have a hemoglobin of 7.6.  Chest x-ray showed pulmonary edema with BNP 966.  Assessment/Plan: Acute on chronic diastolic CHF -daily weights--not accurate -accurate I/O's--question accuracy -fluid restrict -10/14/16 Echo--EF 55-60%, grade 2 DD, mild MR -Continue IV Lasix 60 mg bid -remains clinically fluid overloaded  Acute respiratory failure with hypoxia -Secondary to CHF and COPD exacerbation -Presently stable on 2 L -Wean oxygen for saturation greater than 92% -11/9 CXR--personally reviewed--bilateral patchy air space disease with increased interstitial markings  COPD exacerbation -continue IV solumedrol -continue Duonebs  Symptomatic anemia -December 28, 2016 hemoglobin 13.7 -Check LDH and haptoglobin--not consistent with hemolysis -FOBT negative -CT abdomen pelvis--NEG retroperitoneal bleed  Elevated troponin -Troponin trend flat -Secondary to CKD and decompensated CHF -pt also has loop recorder placed Feb 2015 after stroke  CKD stage IV -Baseline creatinine 4.5-4.8 -Monitor BMP with diuresis -ANA 1:160 but complements and dsDNA negative  Essential hypertension -Continue metoprolol  succinate -Continue hydralazine  Hx of Stroke with left hemiparesis  -continue ASA -check lipid panel--LDL57 -pt has loop recorder placed Feb 2015 -continue statin -PT eval--pt refuses SNF   Diabetes mellitus type 2, controlled -October 15, 2016 hemoglobin A1c 5.7 -NovoLog sliding scale  Hyperlipidemia -Continue statin  Goals of Care -01/08/17--long discussion with spouse at bedside--they want full scope of care   Disposition Plan:   Home in 2-3 days  Family Communication: Spouse update at bedside 11/10--Total time spent 35 minutes.  Greater than 50% spent face to face counseling and coordinating care.   Consultants:  cardiology  Code Status:  FULL  DVT Prophylaxis:  [place] Lovenox   Procedures: As Listed in Progress Note Above  Antibiotics: None      Subjective: Pt is breathing better today, but she has sob with minimal exertion. Patient denies fevers, chills, headache, chest pain,  nausea, vomiting, diarrhea, abdominal pain, dysuria, hematuria, hematochezia, and melena.   Objective: Vitals:   01/08/17 0536 01/08/17 0722 01/08/17 1341 01/08/17 1359  BP: (!) 202/88  134/70   Pulse: (!) 107  (!) 108   Resp: 18     Temp: 98.4 F (36.9 C)  98.3 F (36.8 C)   TempSrc: Oral  Oral   SpO2: 98% 97% 97% 95%  Weight: 85.8 kg (189 lb 3.2 oz)     Height: 5\' 5"  (1.651 m)       Intake/Output Summary (Last 24 hours) at 01/08/2017 1437 Last data filed at 01/08/2017 1300 Gross per 24 hour  Intake 600 ml  Output 850 ml  Net -250 ml   Weight change: 2.359 kg (5 lb 3.2 oz) Exam:   General:  Pt is alert, follows commands appropriately, not in acute distress  HEENT: No icterus, No thrush, No neck mass,  [place]/AT  Cardiovascular: RRR, S1/S2, no rubs, no gallops +JVD  Respiratory: bilateral crackles, bibasilar wheeze  Abdomen: Soft/+BS, non tender, non distended, no guarding  Extremities: 1 + LE edema, No lymphangitis, No petechiae, No rashes, no  synovitis   Data Reviewed: I have personally reviewed following labs and imaging studies Basic Metabolic Panel: Recent Labs  Lab 01/06/17 1607 01/07/17 0630 01/08/17 0634  NA 136 133* 132*  K 3.4* 3.3* 3.4*  CL 105 103 102  CO2 16* 16* 17*  GLUCOSE 144* 108* 214*  BUN 52* 54* 72*  CREATININE 4.76* 4.86* 5.01*  CALCIUM 7.1* 6.9* 6.9*   Liver Function Tests: No results for input(s): AST, ALT, ALKPHOS, BILITOT, PROT, ALBUMIN in the last 168 hours. No results for input(s): LIPASE, AMYLASE in the last 168 hours. No results for input(s): AMMONIA in the last 168 hours. Coagulation Profile: No results for input(s): INR, PROTIME in the last 168 hours. CBC: Recent Labs  Lab 01/06/17 1607 01/07/17 0630  WBC 11.6*  --   HGB 7.6* 10.1*  HCT 22.3* 30.1*  MCV 86.8  --   PLT 304  --    Cardiac Enzymes: Recent Labs  Lab 01/06/17 1607 01/06/17 2109 01/07/17 0630 01/07/17 1321  TROPONINI 0.06* 0.06* 0.07* 0.06*   BNP: Invalid input(s): POCBNP CBG: Recent Labs  Lab 01/07/17 1124 01/07/17 1648 01/07/17 2031 01/08/17 0729 01/08/17 1110  GLUCAP 138* 179* 294* 198* 179*   HbA1C: No results for input(s): HGBA1C in the last 72 hours. Urine analysis:    Component Value Date/Time   COLORURINE YELLOW 10/13/2016 0640   APPEARANCEUR HAZY (A) 10/13/2016 0640   LABSPEC 1.009 10/13/2016 0640   PHURINE 5.0 10/13/2016 0640   GLUCOSEU 50 (A) 10/13/2016 0640   HGBUR NEGATIVE 10/13/2016 0640   BILIRUBINUR NEGATIVE 10/13/2016 0640   KETONESUR NEGATIVE 10/13/2016 0640   PROTEINUR 100 (A) 10/13/2016 0640   UROBILINOGEN 0.2 04/25/2014 2315   NITRITE NEGATIVE 10/13/2016 0640   LEUKOCYTESUR LARGE (A) 10/13/2016 0640   Sepsis Labs: @LABRCNTIP (procalcitonin:4,lacticidven:4) )No results found for this or any previous visit (from the past 240 hour(s)).   Scheduled Meds: . aspirin EC  81 mg Oral Daily  . atorvastatin  40 mg Oral q1800  . calcitRIOL  0.25 mcg Oral Daily  . enoxaparin  (LOVENOX) injection  30 mg Subcutaneous Q24H  . furosemide  60 mg Intravenous BID  . hydrALAZINE  50 mg Oral TID  . Influenza vac split quadrivalent PF  0.5 mL Intramuscular Tomorrow-1000  . insulin aspart  0-15 Units Subcutaneous TID WC  . insulin aspart  0-5 Units Subcutaneous QHS  . ipratropium-albuterol  3 mL Nebulization Q6H  . methylPREDNISolone (SOLU-MEDROL) injection  60 mg Intravenous Q8H  . metoprolol succinate  100 mg Oral Daily  . potassium chloride SA  40 mEq Oral Daily  . sodium bicarbonate  650 mg Oral TID  . sodium chloride flush  3 mL Intravenous Q12H  . venlafaxine XR  150 mg Oral Q breakfast   Continuous Infusions: . sodium chloride    . sodium chloride      Procedures/Studies: Ct Abdomen Pelvis Wo Contrast  Result Date: 01/07/2017 CLINICAL DATA:  Abdominal pain. Decreased hemoglobin. Clinical suspicion for retroperitoneal hemorrhage. EXAM: CT ABDOMEN AND PELVIS WITHOUT CONTRAST TECHNIQUE: Multidetector CT imaging of the abdomen and pelvis was performed following the standard protocol without IV contrast. COMPARISON:  None. FINDINGS: Lower chest: Bibasilar heterogeneous airspace disease and small bilateral pleural effusions. Hepatobiliary: No mass visualized on this unenhanced  exam. Gallbladder is unremarkable. Pancreas: No mass or inflammatory process visualized on this unenhanced exam. Spleen:  Within normal limits in size. Adrenals/Urinary tract: Bilateral renal parenchymal atrophy, left side greater than right, consistent with history of chronic kidney disease. Renal vascular calcification noted. Several tiny calculi are seen within the left renal collecting system and proximal left ureter, without hydronephrosis. Unremarkable unopacified urinary bladder. Stomach/Bowel: No evidence of obstruction, inflammatory process, or abnormal fluid collections. Normal appendix visualized. Mild left-sided colonic diverticulosis seen, without evidence of diverticulitis.  Vascular/Lymphatic: No pathologically enlarged lymph nodes identified. Infrarenal abdominal aortic aneurysm measuring 3.4 x 3.3 cm. Reproductive: Prior hysterectomy noted. Adnexal regions are unremarkable in appearance. Other: No evidence of retroperitoneal hemorrhage or hemoperitoneum. Mild edema in subcutaneous tissues. Musculoskeletal:  No suspicious bone lesions identified. IMPRESSION: No evidence of retroperitoneal hemorrhage or hemoperitoneum. Several tiny calculi in left renal collecting system and proximal left ureter, without hydronephrosis. Bilateral renal parenchymal atrophy, left side greater than right. Colonic diverticulosis, without radiographic evidence of diverticulitis. Small bilateral pleural effusions and bilateral lower lobe airspace disease. 3.4 cm infrarenal abdominal aortic aneurysm. Recommend followup by ultrasound in 3 years. This recommendation follows ACR consensus guidelines: White Paper of the ACR Incidental Findings Committee II on Vascular Findings. Natasha Mead Coll Radiol 2013; 10:789-794 Electronically Signed   By: Earle Gell M.D.   On: 01/07/2017 10:22   Dg Chest 2 View  Result Date: 01/06/2017 CLINICAL DATA:  Trouble breathing x1 day with tightness and congestion in chest. Weakness. Hx of diabetes, HTN-controlled with medication, and low hemoglobin. 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 EXAM: CHEST  2 VIEW COMPARISON:  10/19/2016 FINDINGS: The heart is enlarged. There is pulmonary vascular congestion. Prominent interstitial markings are consistent with mild edema. Trace bilateral pleural effusions are also suspected. There is mild bibasilar atelectasis. IMPRESSION: Cardiomegaly and mild pulmonary edema. Electronically Signed   By: Nolon Nations M.D.   On: 01/06/2017 16:56   Dg Chest Port 1 View  Result Date: 01/07/2017 CLINICAL DATA:  66 year old female central line placement. EXAM: PORTABLE CHEST 1 VIEW COMPARISON:  1016 hours today and earlier. FINDINGS:  Portable AP semi upright view at 1432 hours. New right IJ approach central line is in place, tip projects at the level of the right mainstem bronchus corresponding to the lower SVC. No pneumothorax. Stable cardiac size and mediastinal contours. Stable bilateral ventilation since this morning including dense retrocardiac opacity which has progressed since 01/06/2017. Visualized tracheal air column is within normal limits. IMPRESSION: 1. Right IJ central line tip at the lower SVC level. No pneumothorax or adverse features. 2. Stable ventilation since 1016 hours today. Electronically Signed   By: Genevie Ann M.D.   On: 01/07/2017 15:02   Dg Chest Port 1 View  Result Date: 01/07/2017 CLINICAL DATA:  Respiratory distress. EXAM: PORTABLE CHEST 1 VIEW COMPARISON:  01/06/2017 FINDINGS: Lungs are adequately inflated with worsening patchy multifocal airspace opacification most notable over the left midlung and right upper lobe likely a multifocal infectious process. Possible small amount left pleural fluid. Mild stable cardiomegaly. Remainder of the exam is unchanged. IMPRESSION: Slight worsening bilateral multifocal airspace process most notable over the left midlung and right upper lobe suggesting multifocal infection. Stable cardiomegaly. Electronically Signed   By: Marin Olp M.D.   On: 01/07/2017 10:37    Javares Kaufhold, DO  [facility] Pager [phone]  If 7PM-7AM, please contact night-coverage [email or web address] Password [email or web address] 01/08/2017, 2:37 PM   LOS: 2 days

## 2017-01-08 NOTE — Progress Notes (Signed)
*  PRELIMINARY RESULTS* Echocardiogram 2D Echocardiogram has been performed by Alvino Chapel.  Leavy Cella 01/08/2017, 8:48 AM

## 2017-01-09 ENCOUNTER — Inpatient Hospital Stay (HOSPITAL_COMMUNITY): Payer: 59

## 2017-01-09 DIAGNOSIS — I509 Heart failure, unspecified: Secondary | ICD-10-CM

## 2017-01-09 DIAGNOSIS — R7989 Other specified abnormal findings of blood chemistry: Secondary | ICD-10-CM

## 2017-01-09 DIAGNOSIS — N179 Acute kidney failure, unspecified: Secondary | ICD-10-CM

## 2017-01-09 DIAGNOSIS — J9600 Acute respiratory failure, unspecified whether with hypoxia or hypercapnia: Secondary | ICD-10-CM

## 2017-01-09 DIAGNOSIS — N184 Chronic kidney disease, stage 4 (severe): Secondary | ICD-10-CM

## 2017-01-09 LAB — GLUCOSE, CAPILLARY
GLUCOSE-CAPILLARY: 184 mg/dL — AB (ref 65–99)
Glucose-Capillary: 305 mg/dL — ABNORMAL HIGH (ref 65–99)
Glucose-Capillary: 319 mg/dL — ABNORMAL HIGH (ref 65–99)
Glucose-Capillary: 220 mg/dL — ABNORMAL HIGH (ref 65–99)

## 2017-01-09 LAB — BASIC METABOLIC PANEL
Anion gap: 13 (ref 5–15)
BUN: 93 mg/dL — ABNORMAL HIGH (ref 6–20)
CALCIUM: 7.1 mg/dL — AB (ref 8.9–10.3)
CO2: 18 mmol/L — ABNORMAL LOW (ref 22–32)
CREATININE: 5.59 mg/dL — AB (ref 0.44–1.00)
Chloride: 103 mmol/L (ref 101–111)
GFR calc non Af Amer: 7 mL/min — ABNORMAL LOW (ref >60)
GFR, EST AFRICAN AMERICAN: 8 mL/min — AB (ref >60)
Glucose, Bld: 194 mg/dL — ABNORMAL HIGH (ref 65–99)
Potassium: 4.1 mmol/L (ref 3.5–5.1)
SODIUM: 134 mmol/L — AB (ref 135–145)

## 2017-01-09 LAB — CALCIUM, IONIZED: CALCIUM, IONIZED, SERUM: 3.7 mg/dL — AB (ref 4.5–5.6)

## 2017-01-09 MED ORDER — FUROSEMIDE 10 MG/ML IJ SOLN
40.0000 mg | INTRAMUSCULAR | Status: AC
  Administered 2017-01-09: 40 mg via INTRAVENOUS

## 2017-01-09 MED ORDER — BUDESONIDE 0.25 MG/2ML IN SUSP: 0.2500 mg | Freq: Two times a day (BID) | RESPIRATORY_TRACT | Status: DC

## 2017-01-09 MED ORDER — BUDESONIDE 0.5 MG/2ML IN SUSP
0.5000 mg | Freq: Two times a day (BID) | RESPIRATORY_TRACT | Status: DC
  Administered 2017-01-09 – 2017-01-27 (×35): 0.5 mg via RESPIRATORY_TRACT
  Filled 2017-01-09 (×36): qty 2

## 2017-01-09 MED ORDER — FUROSEMIDE 10 MG/ML IJ SOLN
INTRAMUSCULAR | Status: AC
  Filled 2017-01-09: qty 4

## 2017-01-09 MED ORDER — IPRATROPIUM-ALBUTEROL 0.5-2.5 (3) MG/3ML IN SOLN
3.0000 mL | RESPIRATORY_TRACT | Status: DC
  Administered 2017-01-09 – 2017-01-11 (×12): 3 mL via RESPIRATORY_TRACT
  Filled 2017-01-09 (×13): qty 3

## 2017-01-09 MED ORDER — FUROSEMIDE 10 MG/ML IJ SOLN
120.0000 mg | Freq: Two times a day (BID) | INTRAVENOUS | Status: DC
  Administered 2017-01-09 – 2017-01-13 (×9): 120 mg via INTRAVENOUS
  Filled 2017-01-09 (×16): qty 12

## 2017-01-09 NOTE — Consult Note (Signed)
Reason for Consult: Fluid overload and worsening of renal failure Referring Physician: Dr. Gillis Kelli [name] is an 66 y.o. female.  HPI: She is a patient who has history of hypertension, cryptogenic CVA with left-sided weakness, history of atrial fibrillation status post IR L, chronic renal failure stage V presently came with complaints of weakness, difficulty breathing.  When she was evaluated she was found to have anemia and congestive heart failure.  Presently patient is states that she is feeling somewhat better but feels weak.  Last night she has episode of difficulty breathing which has improved.  She did not have any nausea or vomiting.  Her appetite however is poor.  Patient was here in August with similar issue but improved and was discharged home to be followed as an outpatient.  Past Medical History:  Diagnosis Date  . Carotid artery occlusion    Occluded RICA, status post left CEA  August 2014 - Dr. Donnetta Hutching  . Cerebral infarction [facility]) Aug 2014   Bihemispheric watershed infarcts  . Cerebral infarction involving left cerebellar artery [facility]) Feb 2015  . CKD (chronic kidney disease) stage 3, GFR 30-59 ml/min (HCC)   . Closed dislocation of left humerus 07/26/2013  . DM (diabetes mellitus), type 2 ([place])   . Essential hypertension, benign   . Fibromyalgia   . Mixed hyperlipidemia   . Multiple gastric ulcers   . Urinary incontinence     Past Surgical History:  Procedure Laterality Date  . COMBINED HYSTERECTOMY VAGINAL W/ MMK / A&P REPAIR  1981  . LOOP RECORDER IMPLANT  04/16/13   MDT LinQ implanted for cryptogenic stroke  . URETHRAL DILATION  1980's  . VAGINAL HYSTERECTOMY  1981   "partial" (10/04/2012)    Family History  Problem Relation Age of Onset  . Diabetes Mother   . Diabetes Father   . Hypertension Sister   . Diabetes Brother   . Seizures Son   . Kidney disease Maternal Grandmother   . Hypertension Maternal Grandmother   . Heart disease Maternal Grandfather   .  Diabetes Paternal Grandmother   . Heart disease Paternal Grandfather     Social History:  reports that she quit smoking about 4 years ago. Her smoking use included cigarettes. She has a 44.00 pack-year smoking history. she has never used smokeless tobacco. She reports that she does not drink alcohol or use drugs.  Allergies:  Allergies  Allergen Reactions  . Hydrochlorothiazide Nausea And Vomiting    Medications: I have reviewed the patient's current medications.  Results for orders placed or performed during the hospital encounter of 01/06/17 (from the past 48 hour(s))  Troponin I (q 6hr x 3)     Status: Abnormal   Collection Time: 01/07/17  1:21 PM  Result Value Ref Range   Troponin I 0.06 (HH) <0.03 ng/mL    Comment: CRITICAL VALUE NOTED.  VALUE IS CONSISTENT WITH PREVIOUSLY REPORTED AND CALLED VALUE.  Glucose, capillary     Status: Abnormal   Collection Time: 01/07/17  4:48 PM  Result Value Ref Range   Glucose-Capillary 179 (H) 65 - 99 mg/dL   Comment 1 Notify RN    Comment 2 Document in Chart   Glucose, capillary     Status: Abnormal   Collection Time: 01/07/17  8:31 PM  Result Value Ref Range   Glucose-Capillary 294 (H) 65 - 99 mg/dL   Comment 1 Notify RN    Comment 2 Document in Chart   Basic metabolic panel  Status: Abnormal   Collection Time: 01/08/17  6:34 AM  Result Value Ref Range   Sodium 132 (L) 135 - 145 mmol/L   Potassium 3.4 (L) 3.5 - 5.1 mmol/L   Chloride 102 101 - 111 mmol/L   CO2 17 (L) 22 - 32 mmol/L   Glucose, Bld 214 (H) 65 - 99 mg/dL   BUN 72 (H) 6 - 20 mg/dL   Creatinine, Ser 5.01 (H) 0.44 - 1.00 mg/dL   Calcium 6.9 (L) 8.9 - 10.3 mg/dL   GFR calc non Af Amer 8 (L) >60 mL/min   GFR calc Af Amer 9 (L) >60 mL/min    Comment: (NOTE) The eGFR has been calculated using the CKD EPI equation. This calculation has not been validated in all clinical situations. eGFR's persistently <60 mL/min signify possible Chronic Kidney Disease.    Anion gap 13  5 - 15  Glucose, capillary     Status: Abnormal   Collection Time: 01/08/17  7:29 AM  Result Value Ref Range   Glucose-Capillary 198 (H) 65 - 99 mg/dL  Glucose, capillary     Status: Abnormal   Collection Time: 01/08/17 11:10 AM  Result Value Ref Range   Glucose-Capillary 179 (H) 65 - 99 mg/dL  Glucose, capillary     Status: Abnormal   Collection Time: 01/08/17  4:59 PM  Result Value Ref Range   Glucose-Capillary 242 (H) 65 - 99 mg/dL  Procalcitonin - Baseline     Status: None   Collection Time: 01/08/17  5:03 PM  Result Value Ref Range   Procalcitonin 0.39 ng/mL    Comment:        Interpretation: PCT (Procalcitonin) <= 0.5 ng/mL: Systemic infection (sepsis) is not likely. Local bacterial infection is possible. (NOTE)         ICU PCT Algorithm               Non ICU PCT Algorithm    ----------------------------     ------------------------------         PCT < 0.25 ng/mL                 PCT < 0.1 ng/mL     Stopping of antibiotics            Stopping of antibiotics       strongly encouraged.               strongly encouraged.    ----------------------------     ------------------------------       PCT level decrease by               PCT < 0.25 ng/mL       >= 80% from peak PCT       OR PCT 0.25 - 0.5 ng/mL          Stopping of antibiotics                                             encouraged.     Stopping of antibiotics           encouraged.    ----------------------------     ------------------------------       PCT level decrease by              PCT >= 0.25 ng/mL       < 80% from peak PCT  AND PCT >= 0.5 ng/mL            Continuin g antibiotics                                              encouraged.       Continuing antibiotics            encouraged.    ----------------------------     ------------------------------     PCT level increase compared          PCT > 0.5 ng/mL         with peak PCT AND          PCT >= 0.5 ng/mL             Escalation of antibiotics                                           strongly encouraged.      Escalation of antibiotics        strongly encouraged.   Glucose, capillary     Status: Abnormal   Collection Time: 01/08/17  8:27 PM  Result Value Ref Range   Glucose-Capillary 177 (H) 65 - 99 mg/dL   Comment 1 Notify RN    Comment 2 Document in Chart   Basic metabolic panel     Status: Abnormal   Collection Time: 01/09/17  6:53 AM  Result Value Ref Range   Sodium 134 (L) 135 - 145 mmol/L   Potassium 4.1 3.5 - 5.1 mmol/L    Comment: DELTA CHECK NOTED   Chloride 103 101 - 111 mmol/L   CO2 18 (L) 22 - 32 mmol/L   Glucose, Bld 194 (H) 65 - 99 mg/dL   BUN 93 (H) 6 - 20 mg/dL   Creatinine, Ser 5.59 (H) 0.44 - 1.00 mg/dL   Calcium 7.1 (L) 8.9 - 10.3 mg/dL   GFR calc non Af Amer 7 (L) >60 mL/min   GFR calc Af Amer 8 (L) >60 mL/min    Comment: (NOTE) The eGFR has been calculated using the CKD EPI equation. This calculation has not been validated in all clinical situations. eGFR's persistently <60 mL/min signify possible Chronic Kidney Disease.    Anion gap 13 5 - 15  Glucose, capillary     Status: Abnormal   Collection Time: 01/09/17  7:16 AM  Result Value Ref Range   Glucose-Capillary 184 (H) 65 - 99 mg/dL  Glucose, capillary     Status: Abnormal   Collection Time: 01/09/17 11:30 AM  Result Value Ref Range   Glucose-Capillary 305 (H) 65 - 99 mg/dL    Dg Chest Port 1 View  Result Date: 01/09/2017 CLINICAL DATA:  CHF EXAM: PORTABLE CHEST 1 VIEW COMPARISON:  01/07/2017 FINDINGS: Severe bilateral airspace disease similar to the prior study. Probable congestive heart failure with edema. Pneumonia not excluded. Bibasilar atelectasis and small left effusion. Right jugular central venous catheter tip in the SVC unchanged IMPRESSION: Diffuse bilateral airspace disease unchanged. Probable heart failure. Electronically Signed   By: Franchot Gallo M.D.   On: 01/09/2017 11:27   Dg Chest Port 1 View  Result Date:  01/07/2017 CLINICAL DATA:  66 year old female central line placement. EXAM: PORTABLE CHEST 1 VIEW COMPARISON:  1016  hours today and earlier. FINDINGS: Portable AP semi upright view at 1432 hours. New right IJ approach central line is in place, tip projects at the level of the right mainstem bronchus corresponding to the lower SVC. No pneumothorax. Stable cardiac size and mediastinal contours. Stable bilateral ventilation since this morning including dense retrocardiac opacity which has progressed since 01/06/2017. Visualized tracheal air column is within normal limits. IMPRESSION: 1. Right IJ central line tip at the lower SVC level. No pneumothorax or adverse features. 2. Stable ventilation since 1016 hours today. Electronically Signed   By: Genevie Ann M.D.   On: 01/07/2017 15:02    Review of Systems  Constitutional: Negative for chills and fever.  HENT: Positive for congestion.   Respiratory: Positive for shortness of breath and wheezing. Negative for cough and hemoptysis.   Cardiovascular: Positive for orthopnea and leg swelling. Negative for chest pain.  Gastrointestinal: Negative for nausea and vomiting.   Blood pressure 132/70, pulse 88, temperature 97.8 F (36.6 C), temperature source Oral, resp. rate 20, height _0  (1.651 m), weight 86.7 kg (191 lb 1.6 oz), SpO2 95 %. Physical Exam  Constitutional: No distress.  Neck: JVD present.  Cardiovascular:  irregular rate and rhythm  Respiratory: No respiratory distress. She has wheezes. She has rales.  GI: She exhibits no distension. There is no tenderness.  Musculoskeletal: She exhibits edema.    Assessment/Plan: Problem #1 difficulty in breathing: Patient states that she is feeling better but had episode of difficulty breathing last night and no response to diuretics.  Patient presently on oxygen  2] chronic renal failure: Stage V her last creatinine this year ranged between 4.5 up to 5.  Etiology was thought to be secondary to  diabetes/ischemia/hypertension/recurrent acute kidney injury.  Presently her renal function continued to decline.  She did not have any nausea or vomiting but appetite is poor 3] hypertension: Her blood pressure is reasonably controlled 4] cryptogenic CVA with left hemiparesis 5] atrial fibrillation 6] low CO2: Total be secondary to metabolic acidosis the patient on sodium bicarb.  Her CO2 still low 7] anemia: Most likely secondary to chronic renal failure.  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  Possibly we will send her for tunnel catheter placement.   Kelli [name] S 01/09/2017, 12:25 PM

## 2017-01-09 NOTE — Progress Notes (Signed)
01/09/17 2000  PT Visit Information  Last PT Received On 01/09/17  Assistance Needed +1 (2 if getting to the chair)  History of Present Illness 66 yo female with onset of SOB and weakness was admitted, has notable L hemi presentation with chronic O2 use with low CO2, low functional level at baseline.  PMHx:  stroke, CKD, CHF, DM, HTN, L humeral fracture, carotid occlusion, a-fib, low CO2.  Precautions  Precautions Fall (telemetry)  Restrictions  Weight Bearing Restrictions No  Home Living  Family/patient expects to be discharged to: Private residence  Living Arrangements Children  Available Help at Discharge Family  Type of [place] entrance  [place] Two level  Alternate Level Stairs-Number of Steps patient stays on first floor  Alternate Level Stairs-Rails None  Bathroom Shower/Tub Tub/shower unit  [place] - 2 wheels;BSC;Shower seat;Wheelchair - manual  Prior Function  Level of Independence Needs assistance  Gait / Transfers Assistance Needed 1 person stand pivot with Min/mod assist  ADL's / Homemaking Assistance Needed son assists all bathing and dressing  Communication  Communication No difficulties  Pain Assessment  Pain Assessment Faces  Faces Pain Scale 6  Pain Location knees and ankles  Pain Descriptors / Indicators Sore  Pain Intervention(s) Limited activity within patient's tolerance;Monitored during session;Repositioned  Cognition  Arousal/Alertness Awake/alert  Behavior During Therapy Flat affect  Overall Cognitive Status Within Functional Limits for tasks assessed  Upper Extremity Assessment  Upper Extremity Assessment Generalized weakness;LUE deficits/detail  LUE Deficits / Details very little active control of LUE d/t old stroke  LUE Coordination decreased gross motor;decreased fine motor  Lower Extremity Assessment  Lower Extremity Assessment LLE deficits/detail   LLE Deficits / Details dense weakness compared to RLE  LLE Coordination decreased gross motor;decreased fine motor  Cervical / Trunk Assessment  Cervical / Trunk Assessment Normal  Bed Mobility  Overal bed mobility Needs Assistance  Bed Mobility Supine to Sit;Sit to Supine  Supine to sit Mod assist;Min assist  Sit to supine Min assist;Mod assist  General bed mobility comments pt used her R side to assist sitting and could not touch LUE to assist  Transfers  Overall transfer level Needs assistance  Equipment used 1 person hand held assist  Transfers Sit to/from Stand  Sit to Stand Mod assist  General transfer comment assisted by supporting LUE and under R side  Ambulation/Gait  Ambulation/Gait assistance Min assist;Mod assist  Ambulation Distance (Feet) 3 Feet  Assistive device 1 person hand held assist  Gait Pattern/deviations Step-to pattern;Wide base of support;Trunk flexed;Shuffle;Decreased stride length  Gait velocity reduced  Gait velocity interpretation Below normal speed for age/gender  Modified Rankin (Stroke Patients Only)  Pre-Morbid Rankin Score 3  Modified Rankin 4  Balance  Overall balance assessment Needs assistance  Sitting-balance support Feet supported;Single extremity supported  Sitting balance-Leahy Scale Fair  Postural control Posterior lean  Standing balance support Bilateral upper extremity supported;During functional activity  Standing balance-Leahy Scale Poor  PT - End of Session  Equipment Utilized During Treatment Gait belt;Oxygen  Activity Tolerance Patient limited by fatigue;Treatment limited secondary to medical complications (Comment)  Patient left in bed;with call bell/phone within reach;with bed alarm set;with family/visitor present  Nurse Communication Mobility status  PT Assessment  PT Recommendation/Assessment Patient needs continued PT services  PT Visit Diagnosis Unsteadiness on feet (R26.81);Muscle weakness (generalized)  (M62.81);Difficulty in walking, not elsewhere classified (R26.2);Hemiplegia and hemiparesis  Hemiplegia - Right/Left Left  Hemiplegia - dominant/non-dominant Non-dominant  Hemiplegia - caused by Unspecified  PT Problem List Decreased strength;Decreased range of motion;Decreased activity tolerance;Decreased balance;Decreased mobility;Decreased coordination;Decreased knowledge of use of DME;Decreased safety awareness;Cardiopulmonary status limiting activity;Obesity  Barriers to Discharge Inaccessible home environment;Decreased caregiver support  Barriers to Discharge Comments level living situation and has stairs to enter house  PT Plan  PT Frequency (ACUTE ONLY) Min 3X/week  PT Treatment/Interventions (ACUTE ONLY) DME instruction;Functional mobility training;Therapeutic activities;Balance training;Therapeutic exercise;Neuromuscular re-education;Patient/family education  AM-PAC PT "6 Clicks" Daily Activity Outcome Measure  Difficulty turning over in bed (including adjusting bedclothes, sheets and blankets)? 1  Difficulty moving from lying on back to sitting on the side of the bed?  1  Difficulty sitting down on and standing up from a chair with arms (e.g., wheelchair, bedside commode, etc,.)? 1  Help needed moving to and from a bed to chair (including a wheelchair)? 2  Help needed walking in hospital room? 2  Help needed climbing 3-5 steps with a railing?  1  6 Click Score 8  Mobility G Code  CM  PT Recommendation  Recommendations for Other Services OT consult  Follow Up Recommendations SNF  PT equipment None recommended by PT  Individuals Consulted  Consulted and Agree with Results and Recommendations Patient;Family member/caregiver  Family Member Consulted son  Acute Rehab PT Goals  Patient Stated Goal to get straight home  PT Goal Formulation With patient/family  Time For Goal Achievement 01/23/17  Potential to Achieve Goals Good  PT Time Calculation  PT Start Time (ACUTE ONLY) 1353   PT Stop Time (ACUTE ONLY) 1422  PT Time Calculation (min) (ACUTE ONLY) 29 min  PT G-Codes **NOT FOR INPATIENT CLASS**  Functional Assessment Tool Used AM-PAC 6 Clicks Basic Mobility  PT General Charges  $$ ACUTE PT VISIT 1 Visit  PT Evaluation  $PT Eval Moderate Complexity 1 Mod  PT Treatments  $Therapeutic Activity 8-22 mins  Written Expression  Dominant Hand Right   9:10 PM, 01/09/17 Mee Hives, PT, MS Physical Therapist - [place] [phone] (Office)

## 2017-01-09 NOTE — Progress Notes (Signed)
PROGRESS NOTE  VON INSCOE IHK:[id number] DOB: April 12, 1950 DOA: 01/06/2017 PCP: Manon Hilding, MD  Brief History: 66 year old female with a history of stroke with residual left hemiparesis, CKD stage III, essential hypertension, diabetes mellitus, peripheral vascular disease presentedwith 1 day history of shortness of breath, worsening leg edema, and chest discomfort. The patient also complains of a cough with clear sputum. She had subjective fevers and chills, but denied any headache, neck pain.The patient has chronic nausea and vomiting each morning which has been unchanged. She denies any hematemesis, hematochezia, melena. She complains of intermittent abdominal pain with loose stools. There is no dysuria, hematuria.Upon presentation, the patient was noted to have a hemoglobin of 7.6. Chest x-ray showed pulmonary edema with BNP 966.  The patient was started on IV Lasix.  She had symptomatic improvement, but remained fluid overloaded and her renal function continued to worsen.  Repeat chest x-ray revealed pulmonary edema.  Nephrology was consulted to assist with management and possible initiation of dialysis.  Assessment/Plan: Acute on chronic diastolic CHF -daily weights--not accurate -accurate I/O's--question accuracy -fluid restrict -8/16/18Echo--EF 55-60%, grade 2 DD, mild MR -ContinueIV Lasix 60 mg bid--received extra dose Lasix early am 11/11 due to dyspnea -remains clinically fluid overloaded -11/11-repeat CXR  Acute respiratory failure with hypoxia -Secondary to CHF and COPD exacerbation -Presently stable on 2 L -Wean oxygen for saturation greater than 92% -11/9 CXR--personally reviewed--bilateral patchy air space disease with increased interstitial markings  Acute on chronic renal failure--CKD IV -worsening renal function with diuresis -remains clinically fluid overloaded -likely needs initiation of HD -consult nephrology -Baseline creatinine  4.5-4.8  COPD exacerbation -continue IV solumedrol -continue Duonebs -add pulmicort  Symptomatic anemia -December 28, 2016 hemoglobin 13.7 -Check LDH and haptoglobin--not consistent with hemolysis -FOBTnegative -CT abdomen pelvis--NEG retroperitoneal bleed  Elevated troponin -Troponin trend flat -Secondary to CKD and decompensated CHF -pt also has loop recorder placed Feb 2015 after stroke  Essential hypertension -Continue metoprolol succinate -Continue hydralazine  Hx of Stroke with left hemiparesis  -continue ASA -check lipid panel--LDL57 -pt has loop recorder placed Feb 2015 -continue statin -PT eval--pt refuses SNF  Diabetes mellitus type 2, controlled -October 15, 2016 hemoglobin A1c 5.7 -NovoLog sliding scale  Hyperlipidemia -Continue statin  Goals of Care -01/08/17--long discussion with spouse at bedside--they want full scope of care   Disposition Plan: Not stable for dc Family Communication:Son updateat bedside 11/11--Total time spent 35 minutes. Greater than 50% spent face to face counseling and coordinating care.   Consultants:cardiology, nephrology  Code Status: FULL  DVT Prophylaxis: [place] Lovenox   Procedures: As Listed in Progress Note Above  Antibiotics: None      Subjective: Patient continues to have shortness of breath although it is a little bit better than the time of admission.  She denies any fevers, chills, headache, chest pain,vomiting.  She has some nausea.  She denies any abdominal pain, dysuria, hematuria.  Objective: Vitals:   01/09/17 0250 01/09/17 0408 01/09/17 0557 01/09/17 0725  BP: 115/63  132/70   Pulse: 90  88   Resp: (!) 22  20   Temp:   97.8 F (36.6 C)   TempSrc:   Oral   SpO2: 93% 94% 95% 92%  Weight:   86.7 kg (191 lb 1.6 oz)   Height:   5\' 5"  (1.651 m)     Intake/Output Summary (Last 24 hours) at 01/09/2017 1026 Last data filed at 01/09/2017 0846 Gross per 24 hour  Intake  480 ml  Output 1850 ml  Net -1370 ml   Weight change: 0.862 kg (1 lb 14.4 oz) Exam:   General:  Pt is alert, follows commands appropriately, not in acute distress  HEENT: No icterus, No thrush, No neck mass, [place]/AT  Cardiovascular: RRR, S1/S2, no rubs, no gallops  Respiratory: Bilateral crackles.  Bilateral expiratory wheeze.  Abdomen: Soft/+BS, non tender, non distended, no guarding  Extremities: trace LE edema, No lymphangitis, No petechiae, No rashes, no synovitis   Data Reviewed: I have personally reviewed following labs and imaging studies Basic Metabolic Panel: Recent Labs  Lab 01/06/17 1607 01/07/17 0630 01/08/17 0634 01/09/17 0653  NA 136 133* 132* 134*  K 3.4* 3.3* 3.4* 4.1  CL 105 103 102 103  CO2 16* 16* 17* 18*  GLUCOSE 144* 108* 214* 194*  BUN 52* 54* 72* 93*  CREATININE 4.76* 4.86* 5.01* 5.59*  CALCIUM 7.1* 6.9* 6.9* 7.1*   Liver Function Tests: No results for input(s): AST, ALT, ALKPHOS, BILITOT, PROT, ALBUMIN in the last 168 hours. No results for input(s): LIPASE, AMYLASE in the last 168 hours. No results for input(s): AMMONIA in the last 168 hours. Coagulation Profile: No results for input(s): INR, PROTIME in the last 168 hours. CBC: Recent Labs  Lab 01/06/17 1607 01/07/17 0630  WBC 11.6*  --   HGB 7.6* 10.1*  HCT 22.3* 30.1*  MCV 86.8  --   PLT 304  --    Cardiac Enzymes: Recent Labs  Lab 01/06/17 1607 01/06/17 2109 01/07/17 0630 01/07/17 1321  TROPONINI 0.06* 0.06* 0.07* 0.06*   BNP: Invalid input(s): POCBNP CBG: Recent Labs  Lab 01/08/17 0729 01/08/17 1110 01/08/17 1659 01/08/17 2027 01/09/17 0716  GLUCAP 198* 179* 242* 177* 184*   HbA1C: No results for input(s): HGBA1C in the last 72 hours. Urine analysis:    Component Value Date/Time   COLORURINE YELLOW 10/13/2016 0640   APPEARANCEUR HAZY (A) 10/13/2016 0640   LABSPEC 1.009 10/13/2016 0640   PHURINE 5.0 10/13/2016 0640   GLUCOSEU 50 (A) 10/13/2016 0640   HGBUR  NEGATIVE 10/13/2016 0640   BILIRUBINUR NEGATIVE 10/13/2016 0640   KETONESUR NEGATIVE 10/13/2016 0640   PROTEINUR 100 (A) 10/13/2016 0640   UROBILINOGEN 0.2 04/25/2014 2315   NITRITE NEGATIVE 10/13/2016 0640   LEUKOCYTESUR LARGE (A) 10/13/2016 0640   Sepsis Labs: @LABRCNTIP (procalcitonin:4,lacticidven:4) )No results found for this or any previous visit (from the past 240 hour(s)).   Scheduled Meds: . aspirin EC  81 mg Oral Daily  . atorvastatin  40 mg Oral q1800  . calcitRIOL  0.25 mcg Oral Daily  . enoxaparin (LOVENOX) injection  30 mg Subcutaneous Q24H  . hydrALAZINE  50 mg Oral TID  . Influenza vac split quadrivalent PF  0.5 mL Intramuscular Tomorrow-1000  . insulin aspart  0-15 Units Subcutaneous TID WC  . insulin aspart  0-5 Units Subcutaneous QHS  . ipratropium-albuterol  3 mL Nebulization Q4H WA  . methylPREDNISolone (SOLU-MEDROL) injection  60 mg Intravenous Q8H  . metoprolol succinate  100 mg Oral Daily  . potassium chloride SA  40 mEq Oral Daily  . sodium bicarbonate  650 mg Oral TID  . sodium chloride flush  3 mL Intravenous Q12H  . venlafaxine XR  150 mg Oral Q breakfast   Continuous Infusions: . sodium chloride    . sodium chloride      Procedures/Studies: Ct Abdomen Pelvis Wo Contrast  Result Date: 01/07/2017 CLINICAL DATA:  Abdominal pain. Decreased hemoglobin. Clinical suspicion for retroperitoneal  hemorrhage. EXAM: CT ABDOMEN AND PELVIS WITHOUT CONTRAST TECHNIQUE: Multidetector CT imaging of the abdomen and pelvis was performed following the standard protocol without IV contrast. COMPARISON:  None. FINDINGS: Lower chest: Bibasilar heterogeneous airspace disease and small bilateral pleural effusions. Hepatobiliary: No mass visualized on this unenhanced exam. Gallbladder is unremarkable. Pancreas: No mass or inflammatory process visualized on this unenhanced exam. Spleen:  Within normal limits in size. Adrenals/Urinary tract: Bilateral renal parenchymal atrophy,  left side greater than right, consistent with history of chronic kidney disease. Renal vascular calcification noted. Several tiny calculi are seen within the left renal collecting system and proximal left ureter, without hydronephrosis. Unremarkable unopacified urinary bladder. Stomach/Bowel: No evidence of obstruction, inflammatory process, or abnormal fluid collections. Normal appendix visualized. Mild left-sided colonic diverticulosis seen, without evidence of diverticulitis. Vascular/Lymphatic: No pathologically enlarged lymph nodes identified. Infrarenal abdominal aortic aneurysm measuring 3.4 x 3.3 cm. Reproductive: Prior hysterectomy noted. Adnexal regions are unremarkable in appearance. Other: No evidence of retroperitoneal hemorrhage or hemoperitoneum. Mild edema in subcutaneous tissues. Musculoskeletal:  No suspicious bone lesions identified. IMPRESSION: No evidence of retroperitoneal hemorrhage or hemoperitoneum. Several tiny calculi in left renal collecting system and proximal left ureter, without hydronephrosis. Bilateral renal parenchymal atrophy, left side greater than right. Colonic diverticulosis, without radiographic evidence of diverticulitis. Small bilateral pleural effusions and bilateral lower lobe airspace disease. 3.4 cm infrarenal abdominal aortic aneurysm. Recommend followup by ultrasound in 3 years. This recommendation follows ACR consensus guidelines: White Paper of the ACR Incidental Findings Committee II on Vascular Findings. Natasha Mead Coll Radiol 2013; 10:789-794 Electronically Signed   By: Earle Gell M.D.   On: 01/07/2017 10:22   Dg Chest 2 View  Result Date: 01/06/2017 CLINICAL DATA:  Trouble breathing x1 day with tightness and congestion in chest. Weakness. Hx of diabetes, HTN-controlled with medication, and low hemoglobin. 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 EXAM: CHEST  2 VIEW COMPARISON:  10/19/2016 FINDINGS: The heart is enlarged. There is pulmonary vascular  congestion. Prominent interstitial markings are consistent with mild edema. Trace bilateral pleural effusions are also suspected. There is mild bibasilar atelectasis. IMPRESSION: Cardiomegaly and mild pulmonary edema. Electronically Signed   By: Nolon Nations M.D.   On: 01/06/2017 16:56   Dg Chest Port 1 View  Result Date: 01/07/2017 CLINICAL DATA:  66 year old female central line placement. EXAM: PORTABLE CHEST 1 VIEW COMPARISON:  1016 hours today and earlier. FINDINGS: Portable AP semi upright view at 1432 hours. New right IJ approach central line is in place, tip projects at the level of the right mainstem bronchus corresponding to the lower SVC. No pneumothorax. Stable cardiac size and mediastinal contours. Stable bilateral ventilation since this morning including dense retrocardiac opacity which has progressed since 01/06/2017. Visualized tracheal air column is within normal limits. IMPRESSION: 1. Right IJ central line tip at the lower SVC level. No pneumothorax or adverse features. 2. Stable ventilation since 1016 hours today. Electronically Signed   By: Genevie Ann M.D.   On: 01/07/2017 15:02   Dg Chest Port 1 View  Result Date: 01/07/2017 CLINICAL DATA:  Respiratory distress. EXAM: PORTABLE CHEST 1 VIEW COMPARISON:  01/06/2017 FINDINGS: Lungs are adequately inflated with worsening patchy multifocal airspace opacification most notable over the left midlung and right upper lobe likely a multifocal infectious process. Possible small amount left pleural fluid. Mild stable cardiomegaly. Remainder of the exam is unchanged. IMPRESSION: Slight worsening bilateral multifocal airspace process most notable over the left midlung and right upper lobe suggesting multifocal infection. Stable  cardiomegaly. Electronically Signed   By: Marin Olp M.D.   On: 01/07/2017 10:37    Shawnta Zimbelman, DO  [facility] Pager [phone]  If 7PM-7AM, please contact night-coverage [email or web address] Password  [email or web address] 01/09/2017, 10:26 AM   LOS: 3 days

## 2017-01-10 ENCOUNTER — Encounter (HOSPITAL_COMMUNITY): Payer: Self-pay | Admitting: Primary Care

## 2017-01-10 DIAGNOSIS — Z7189 Other specified counseling: Secondary | ICD-10-CM

## 2017-01-10 DIAGNOSIS — Z515 Encounter for palliative care: Secondary | ICD-10-CM

## 2017-01-10 LAB — RENAL FUNCTION PANEL
ALBUMIN: 3.2 g/dL — AB (ref 3.5–5.0)
ANION GAP: 15 (ref 5–15)
BUN: 112 mg/dL — ABNORMAL HIGH (ref 6–20)
CHLORIDE: 99 mmol/L — AB (ref 101–111)
CO2: 17 mmol/L — ABNORMAL LOW (ref 22–32)
Calcium: 6.8 mg/dL — ABNORMAL LOW (ref 8.9–10.3)
Creatinine, Ser: 5.86 mg/dL — ABNORMAL HIGH (ref 0.44–1.00)
GFR, EST AFRICAN AMERICAN: 8 mL/min — AB (ref >60)
GFR, EST NON AFRICAN AMERICAN: 7 mL/min — AB (ref >60)
Glucose, Bld: 205 mg/dL — ABNORMAL HIGH (ref 65–99)
PHOSPHORUS: 6.9 mg/dL — AB (ref 2.5–4.6)
POTASSIUM: 5 mmol/L (ref 3.5–5.1)
Sodium: 131 mmol/L — ABNORMAL LOW (ref 135–145)

## 2017-01-10 LAB — URINALYSIS, ROUTINE W REFLEX MICROSCOPIC
Bilirubin Urine: NEGATIVE
GLUCOSE, UA: NEGATIVE mg/dL
Hgb urine dipstick: NEGATIVE
Ketones, ur: NEGATIVE mg/dL
Nitrite: NEGATIVE
PROTEIN: 100 mg/dL — AB
Specific Gravity, Urine: 1.011 (ref 1.005–1.030)
WBC UA: NONE SEEN WBC/hpf (ref 0–5)
pH: 7 (ref 5.0–8.0)

## 2017-01-10 LAB — GLUCOSE, CAPILLARY
GLUCOSE-CAPILLARY: 200 mg/dL — AB (ref 65–99)
GLUCOSE-CAPILLARY: 278 mg/dL — AB (ref 65–99)
GLUCOSE-CAPILLARY: 239 mg/dL — AB (ref 65–99)
Glucose-Capillary: 178 mg/dL — ABNORMAL HIGH (ref 65–99)

## 2017-01-10 LAB — ECHOCARDIOGRAM COMPLETE
Height: 65 in
Weight: 2927.71 oz

## 2017-01-10 MED ORDER — ENOXAPARIN SODIUM 30 MG/0.3ML ~~LOC~~ SOLN
30.0000 mg | SUBCUTANEOUS | Status: DC
  Administered 2017-01-11 – 2017-01-12 (×2): 30 mg via SUBCUTANEOUS
  Filled 2017-01-10 (×2): qty 0.3

## 2017-01-10 MED ORDER — CALCIUM ACETATE (PHOS BINDER) 667 MG PO CAPS
667.0000 mg | ORAL_CAPSULE | Freq: Three times a day (TID) | ORAL | Status: DC
  Administered 2017-01-10 – 2017-01-19 (×23): 667 mg via ORAL
  Filled 2017-01-10 (×25): qty 1

## 2017-01-10 MED ORDER — ALUM & MAG HYDROXIDE-SIMETH 200-200-20 MG/5ML PO SUSP
30.0000 mL | Freq: Four times a day (QID) | ORAL | Status: DC | PRN
  Administered 2017-01-10 – 2017-01-17 (×2): 30 mL via ORAL
  Filled 2017-01-10 (×2): qty 30

## 2017-01-10 NOTE — Progress Notes (Deleted)
Pt placed on [facility] CPAP.CPAP plugged into red outlet. 3L O2 inline.

## 2017-01-10 NOTE — Progress Notes (Signed)
Inpatient Diabetes Program Recommendations  AACE/ADA: New Consensus Statement on Inpatient Glycemic Control (2015)  Target Ranges:  Prepandial:   less than 140 mg/dL      Peak postprandial:   less than 180 mg/dL (1-2 hours)      Critically ill patients:  140 - 180 mg/dL   Results for SHALA, BAUMBACH (MRN [id number]) as of 01/10/2017 08:10  Ref. Range 01/09/2017 07:16 01/09/2017 11:30 01/09/2017 16:37 01/09/2017 21:03  Glucose-Capillary Latest Ref Range: 65 - 99 mg/dL 184 (H)  3 units Novolog 305 (H)  11 units Novolog 319 (H)  11 units Novolog 220 (H)  2 units Novolog   Results for MARIZOL, BORROR (MRN [id number]) as of 01/10/2017 08:10  Ref. Range 01/10/2017 07:29  Glucose-Capillary Latest Ref Range: 65 - 99 mg/dL 200 (H)  2 units Novolog    Home DM Meds: None  Current Insulin Orders: Novolog Moderate Correction Scale/ SSI (0-15 units) TID AC + HS      MD- Note patient receiving Solumedrol 60 mg Q8 hours.  Having worsening renal function.  Glucose levels elevated yesterday and elevated this AM.  Please consider the following:  1. Start low dose basal insulin: Levemir 8 units daily (0.1 units/kg dosing)  2. Start low dose Novolog Meal Coverage: Novolog 3 units TID with meals (hold if pt eats <50% of meal)       --Will follow patient during hospitalization--  Wyn Quaker RN, MSN, CDE Diabetes Coordinator Inpatient Glycemic Control Team Team Pager: [phone] (8a-5p)

## 2017-01-10 NOTE — Progress Notes (Signed)
PT Cancellation Note  Patient Details Name: Kelli [name] MRN: [id number] DOB: 1950-08-20   Cancelled Treatment:    Reason Eval/Treat Not Completed: Patient declined, no reason specified. Chart reviewed. Pt reports she is unable to work with PT today as she is not having a good day. When asked for more detail, she simply reports that she does not feel well today and mentions being worried about scheduled surgery for tomorrow. PT explained that patient will likely not be seen for 2-3 more day if not today in light of surgical procedure timeline, and that she will likely continue to get weaker in bed without working with PT. She again refuses but is eventually agreeable to being more participatory at next attempt from PT. Will continue to monitor remotely and attempt again at later date/tiime. If patient undergoes procedure, a new PT order will be needed.   10:34 AM, 01/10/17 Etta Grandchild, PT, DPT Physical Therapist - [place] [phone] (Office)    Buccola,Allan C 01/10/2017, 10:25 AM

## 2017-01-10 NOTE — Progress Notes (Signed)
Kelli [name]  MRN: [id number]  DOB/AGE: March 19, 1950 66 y.o.  Primary Care Physician:[name], [name], MD  Admit date: 01/06/2017  Chief Complaint:  Chief Complaint  Patient presents with  . Shortness of Breath  . Dysuria  . Chest Pain    S-Pt presented on  01/06/2017 with  Chief Complaint  Patient presents with  . Shortness of Breath  . Dysuria  . Chest Pain  .    Pt offers no new complaints but does c/o  Not much energy.     On questioning regarding appetite and taste, pt sister who is present in the room says she has not been  eating much, and pt says food doesn't taste well when you are avoiding salts.    I dicussed with pt about need for renal replacment therapy.    Py voiced understanding    Pt father was on Hd-Pt does understand the issues regarding to dialysis.    Meds . aspirin EC  81 mg Oral Daily  . atorvastatin  40 mg Oral q1800  . budesonide (PULMICORT) nebulizer solution  0.5 mg Nebulization BID  . calcitRIOL  0.25 mcg Oral Daily  . enoxaparin (LOVENOX) injection  30 mg Subcutaneous Q24H  . hydrALAZINE  50 mg Oral TID  . insulin aspart  0-15 Units Subcutaneous TID WC  . insulin aspart  0-5 Units Subcutaneous QHS  . ipratropium-albuterol  3 mL Nebulization Q4H WA  . methylPREDNISolone (SOLU-MEDROL) injection  60 mg Intravenous Q8H  . metoprolol succinate  100 mg Oral Daily  . potassium chloride SA  40 mEq Oral Daily  . sodium bicarbonate  650 mg Oral TID  . sodium chloride flush  3 mL Intravenous Q12H  . venlafaxine XR  150 mg Oral Q breakfast      Physical Exam: Vital signs in last 24 hours: Temp:  [97.8 F (36.6 C)-98.2 F (36.8 C)] 97.8 F (36.6 C) (11/12 0500) Pulse Rate:  [84-86] 86 (11/12 0500) Resp:  [16] 16 (11/12 0500) BP: (108-150)/(65-84) 150/84 (11/12 0500) SpO2:  [93 %-97 %] 93 % (11/12 0804) Weight:  [193 lb 6.4 oz (87.7 kg)] 193 lb 6.4 oz (87.7 kg) (11/12 0500) Weight change: 2 lb 4.8 oz (1.043 kg) Last BM Date:  01/07/17  Intake/Output from previous day: 11/11 0701 - 11/12 0700 In: 1142 [P.O.:1080; IV Piggyback:62] Out: 750 [Urine:750] No intake/output data recorded.   Physical Exam: General- pt is awake,alert, oriented to time place and person Resp- No acute REsp distress, Rhonchi+ CVS- S1S2 regular in rate and rhythm GIT- BS+, soft, NT, ND EXT- 1+ LE Edema, no Cyanosis   Lab Results: Hgb  7.6=> 10.1    BMET Recent Labs    01/09/17 0653 01/10/17 0535  NA 134* 131*  K 4.1 5.0  CL 103 99*  CO2 18* 17*  GLUCOSE 194* 205*  BUN 93* 112*  CREATININE 5.59* 5.86*  CALCIUM 7.1* 6.8*   Creat trend 2018   5.0=>5.6=>5.86   In August admision4.5--5.0 2016   1.4--1.5 2015   1.3--1.7 2014   1.1--2.0 ( AKi)    Lab Results  Component Value Date   PTH 162 (H) 10/14/2016   PTH Comment 10/14/2016   CALCIUM 6.8 (L) 01/10/2017   CAION 1.14 04/08/2013   PHOS 6.9 (H) 01/10/2017     ANA Positive Anti dsDNA negative Complements normal ANCA negative M spike negative Free kappa/lamda chain ratio- 2.08  Alb  3.2 Corrected calcium  6.8+ 0.6=7.4  Impression: 1)Renal  AKI secondary to ATN vs CKD progression               CKD stage 5 .               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  3)Anemia HGb not at goal (9--11) Received prbc. On Epo as outpt  4)CKD Mineral-Bone Disorder PTH elevated. Secondary Hyperparathyroidism- present.    On calcitriol Phosphorus not at  goal. Calcium not at goal  5)DM PMD following  6)Electrolytes  Normokalemic  Hyponatremic   7)Acid base Co2 not at goal On po bicarb    Plan:  Will ask for IR consult  I educated pt and family at length about need/benefiot/risks of  renal; replacement therapy. Pt says I know I need it"  Will start binders that should help with high Phos and low calcium    Shaarav Ripple S 01/10/2017, 10:00 AM

## 2017-01-10 NOTE — Progress Notes (Addendum)
PROGRESS NOTE  Kelli [name] QMV:[id number] DOB: 08/01/1950 DOA: 01/06/2017 PCP: Manon Hilding, MD  Brief History: 66 year old female with a history of stroke with residual left hemiparesis, CKD stage III, essential hypertension, diabetes mellitus, peripheral vascular disease presentedwith 1 day history of shortness of breath, worsening leg edema, and chest discomfort. The patient also complains of a cough with clear sputum. She had subjective fevers and chills, but denied any headache, neck pain.The patient has chronic nausea and vomiting each morning which has been unchanged. She denies any hematemesis, hematochezia, melena. She complains of intermittent abdominal pain with loose stools. There is no dysuria, hematuria.Upon presentation, the patient was noted to have a hemoglobin of 7.6. Chest x-ray showed pulmonary edema with BNP 966.  The patient was started on IV Lasix.  She had symptomatic improvement, but remained fluid overloaded and her renal function continued to worsen.  Repeat chest x-ray revealed pulmonary edema.  Nephrology was consulted to assist with management and possible initiation of dialysis.  Assessment/Plan: Acute on chronic diastolic CHF -daily weights--not accurate -accurate I/O's--question accuracy -fluid restrict -8/16/18Echo--EF 55-60%, grade 2 DD, mild MR -ContinueIV Lasix60 mg bid--received extra dose Lasix early am 11/11 due to dyspnea -remains clinically fluid overloaded -11/11-repeat CXR  Acute respiratory failure with hypoxia -Secondary to CHFand COPD exacerbation -Presently stable on 3 L -Wean oxygen for saturation greater than 92% -11/1 CXR--personally reviewed--bilateral patchy air space disease with increased interstitial markings  Acute on chronic renal failure--CKD IV -worsening renal function with diuresis, progression of CKD -remains clinically fluid overloaded -needs initiation of HD -consult nephrology-->place  PermCath for HD inititation -PermCath planned 11/13 -Baseline creatinine 4.5-4.8  COPD exacerbation -continue IV solumedrol -continue Duonebs -continue pulmicort  Symptomatic anemia -December 28, 2016 hemoglobin 13.7 -Check LDH and haptoglobin--not consistent with hemolysis -FOBTnegative -CT abdomen pelvis--NEGretroperitoneal bleed  Elevated troponin -Troponin trend flat -Secondary to CKD and decompensated CHF -pt also has loop recorder placed Feb 2015 after stroke  Essential hypertension -Continue metoprolol succinate -Continue hydralazine  Hx of Stroke with left hemiparesis  -continue ASA -check lipid panel--LDL57 -pt has loop recorder placed Feb 2015 -continue statin -PT eval--pt refuses SNF  Diabetes mellitus type 2, controlled -October 15, 2016 hemoglobin A1c 5.7 -NovoLog sliding scale  Hyperlipidemia -Continue statin  Goals of Care -01/08/17--long discussion with spouse at bedside--they want full scope of care   Disposition Plan: Not stable for dc--to [facility] for HD catheter 11/13 Family Communication:Sister updated at bedside   Consultants:cardiology, nephrology  Code Status: FULL  DVT Prophylaxis: [place] Lovenox   Procedures: As Listed in Progress Note Above  Antibiotics: None      Subjective: Patient denies fevers, chills, headache, chest pain, dyspnea, nausea, vomiting, diarrhea, abdominal pain, dysuria, hematuria, hematochezia, and melena.   Objective: Vitals:   01/10/17 0500 01/10/17 0800 01/10/17 0804 01/10/17 1111  BP: (!) 150/84     Pulse: 86     Resp: 16     Temp: 97.8 F (36.6 C)     TempSrc: Oral     SpO2: 95% 93% 93% 95%  Weight: 87.7 kg (193 lb 6.4 oz)     Height: 5\' 5"  (1.651 m)       Intake/Output Summary (Last 24 hours) at 01/10/2017 1240 Last data filed at 01/10/2017 1100 Gross per 24 hour  Intake 662 ml  Output 500 ml  Net 162 ml   Weight change: 1.043 kg (2 lb 4.8  oz) Exam:   General:  Pt is alert, follows commands appropriately, not in acute distress  HEENT: No icterus, No thrush, No neck mass, [place]/AT  Cardiovascular: RRR, S1/S2, no rubs, no gallops  Respiratory: bilateral crackles, no wheeze  Abdomen: Soft/+BS, non tender, non distended, no guarding  Extremities: 1+ LE edema, No lymphangitis, No petechiae, No rashes, no synovitis   Data Reviewed: I have personally reviewed following labs and imaging studies Basic Metabolic Panel: Recent Labs  Lab 01/06/17 1607 01/07/17 0630 01/08/17 0634 01/09/17 0653 01/10/17 0535  NA 136 133* 132* 134* 131*  K 3.4* 3.3* 3.4* 4.1 5.0  CL 105 103 102 103 99*  CO2 16* 16* 17* 18* 17*  GLUCOSE 144* 108* 214* 194* 205*  BUN 52* 54* 72* 93* 112*  CREATININE 4.76* 4.86* 5.01* 5.59* 5.86*  CALCIUM 7.1* 6.9* 6.9* 7.1* 6.8*  PHOS  --   --   --   --  6.9*   Liver Function Tests: Recent Labs  Lab 01/10/17 0535  ALBUMIN 3.2*   No results for input(s): LIPASE, AMYLASE in the last 168 hours. No results for input(s): AMMONIA in the last 168 hours. Coagulation Profile: No results for input(s): INR, PROTIME in the last 168 hours. CBC: Recent Labs  Lab 01/06/17 1607 01/07/17 0630  WBC 11.6*  --   HGB 7.6* 10.1*  HCT 22.3* 30.1*  MCV 86.8  --   PLT 304  --    Cardiac Enzymes: Recent Labs  Lab 01/06/17 1607 01/06/17 2109 01/07/17 0630 01/07/17 1321  TROPONINI 0.06* 0.06* 0.07* 0.06*   BNP: Invalid input(s): POCBNP CBG: Recent Labs  Lab 01/09/17 1130 01/09/17 1637 01/09/17 2103 01/10/17 0729 01/10/17 1126  GLUCAP 305* 319* 220* 200* 278*   HbA1C: No results for input(s): HGBA1C in the last 72 hours. Urine analysis:    Component Value Date/Time   COLORURINE YELLOW 10/13/2016 0640   APPEARANCEUR HAZY (A) 10/13/2016 0640   LABSPEC 1.009 10/13/2016 0640   PHURINE 5.0 10/13/2016 0640   GLUCOSEU 50 (A) 10/13/2016 0640   HGBUR NEGATIVE 10/13/2016 0640   BILIRUBINUR NEGATIVE  10/13/2016 0640   KETONESUR NEGATIVE 10/13/2016 0640   PROTEINUR 100 (A) 10/13/2016 0640   UROBILINOGEN 0.2 04/25/2014 2315   NITRITE NEGATIVE 10/13/2016 0640   LEUKOCYTESUR LARGE (A) 10/13/2016 0640   Sepsis Labs: @LABRCNTIP (procalcitonin:4,lacticidven:4) )No results found for this or any previous visit (from the past 240 hour(s)).   Scheduled Meds: . aspirin EC  81 mg Oral Daily  . atorvastatin  40 mg Oral q1800  . budesonide (PULMICORT) nebulizer solution  0.5 mg Nebulization BID  . calcitRIOL  0.25 mcg Oral Daily  . calcium acetate  667 mg Oral TID WC  . [START ON 01/12/2017] enoxaparin (LOVENOX) injection  30 mg Subcutaneous Q24H  . hydrALAZINE  50 mg Oral TID  . insulin aspart  0-15 Units Subcutaneous TID WC  . insulin aspart  0-5 Units Subcutaneous QHS  . ipratropium-albuterol  3 mL Nebulization Q4H WA  . methylPREDNISolone (SOLU-MEDROL) injection  60 mg Intravenous Q8H  . metoprolol succinate  100 mg Oral Daily  . sodium bicarbonate  650 mg Oral TID  . sodium chloride flush  3 mL Intravenous Q12H  . venlafaxine XR  150 mg Oral Q breakfast   Continuous Infusions: . sodium chloride    . sodium chloride    . furosemide Stopped (01/10/17 0850)    Procedures/Studies: Ct Abdomen Pelvis Wo Contrast  Result Date: 01/07/2017 CLINICAL DATA:  Abdominal pain. Decreased hemoglobin. Clinical suspicion for retroperitoneal  hemorrhage. EXAM: CT ABDOMEN AND PELVIS WITHOUT CONTRAST TECHNIQUE: Multidetector CT imaging of the abdomen and pelvis was performed following the standard protocol without IV contrast. COMPARISON:  None. FINDINGS: Lower chest: Bibasilar heterogeneous airspace disease and small bilateral pleural effusions. Hepatobiliary: No mass visualized on this unenhanced exam. Gallbladder is unremarkable. Pancreas: No mass or inflammatory process visualized on this unenhanced exam. Spleen:  Within normal limits in size. Adrenals/Urinary tract: Bilateral renal parenchymal atrophy,  left side greater than right, consistent with history of chronic kidney disease. Renal vascular calcification noted. Several tiny calculi are seen within the left renal collecting system and proximal left ureter, without hydronephrosis. Unremarkable unopacified urinary bladder. Stomach/Bowel: No evidence of obstruction, inflammatory process, or abnormal fluid collections. Normal appendix visualized. Mild left-sided colonic diverticulosis seen, without evidence of diverticulitis. Vascular/Lymphatic: No pathologically enlarged lymph nodes identified. Infrarenal abdominal aortic aneurysm measuring 3.4 x 3.3 cm. Reproductive: Prior hysterectomy noted. Adnexal regions are unremarkable in appearance. Other: No evidence of retroperitoneal hemorrhage or hemoperitoneum. Mild edema in subcutaneous tissues. Musculoskeletal:  No suspicious bone lesions identified. IMPRESSION: No evidence of retroperitoneal hemorrhage or hemoperitoneum. Several tiny calculi in left renal collecting system and proximal left ureter, without hydronephrosis. Bilateral renal parenchymal atrophy, left side greater than right. Colonic diverticulosis, without radiographic evidence of diverticulitis. Small bilateral pleural effusions and bilateral lower lobe airspace disease. 3.4 cm infrarenal abdominal aortic aneurysm. Recommend followup by ultrasound in 3 years. This recommendation follows ACR consensus guidelines: White Paper of the ACR Incidental Findings Committee II on Vascular Findings. Natasha Mead Coll Radiol 2013; 10:789-794 Electronically Signed   By: Earle Gell M.D.   On: 01/07/2017 10:22   Dg Chest 2 View  Result Date: 01/06/2017 CLINICAL DATA:  Trouble breathing x1 day with tightness and congestion in chest. Weakness. Hx of diabetes, HTN-controlled with medication, and low hemoglobin. 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 EXAM: CHEST  2 VIEW COMPARISON:  10/19/2016 FINDINGS: The heart is enlarged. There is pulmonary vascular  congestion. Prominent interstitial markings are consistent with mild edema. Trace bilateral pleural effusions are also suspected. There is mild bibasilar atelectasis. IMPRESSION: Cardiomegaly and mild pulmonary edema. Electronically Signed   By: Nolon Nations M.D.   On: 01/06/2017 16:56   Dg Chest Port 1 View  Result Date: 01/09/2017 CLINICAL DATA:  CHF EXAM: PORTABLE CHEST 1 VIEW COMPARISON:  01/07/2017 FINDINGS: Severe bilateral airspace disease similar to the prior study. Probable congestive heart failure with edema. Pneumonia not excluded. Bibasilar atelectasis and small left effusion. Right jugular central venous catheter tip in the SVC unchanged IMPRESSION: Diffuse bilateral airspace disease unchanged. Probable heart failure. Electronically Signed   By: Franchot Gallo M.D.   On: 01/09/2017 11:27   Dg Chest Port 1 View  Result Date: 01/07/2017 CLINICAL DATA:  66 year old female central line placement. EXAM: PORTABLE CHEST 1 VIEW COMPARISON:  1016 hours today and earlier. FINDINGS: Portable AP semi upright view at 1432 hours. New right IJ approach central line is in place, tip projects at the level of the right mainstem bronchus corresponding to the lower SVC. No pneumothorax. Stable cardiac size and mediastinal contours. Stable bilateral ventilation since this morning including dense retrocardiac opacity which has progressed since 01/06/2017. Visualized tracheal air column is within normal limits. IMPRESSION: 1. Right IJ central line tip at the lower SVC level. No pneumothorax or adverse features. 2. Stable ventilation since 1016 hours today. Electronically Signed   By: Genevie Ann M.D.   On: 01/07/2017 15:02   Dg Chest [facility]  1 View  Result Date: 01/07/2017 CLINICAL DATA:  Respiratory distress. EXAM: PORTABLE CHEST 1 VIEW COMPARISON:  01/06/2017 FINDINGS: Lungs are adequately inflated with worsening patchy multifocal airspace opacification most notable over the left midlung and right upper lobe  likely a multifocal infectious process. Possible small amount left pleural fluid. Mild stable cardiomegaly. Remainder of the exam is unchanged. IMPRESSION: Slight worsening bilateral multifocal airspace process most notable over the left midlung and right upper lobe suggesting multifocal infection. Stable cardiomegaly. Electronically Signed   By: Marin Olp M.D.   On: 01/07/2017 10:37    Vonzella Althaus, DO  [facility] Pager [phone]  If 7PM-7AM, please contact night-coverage [email or web address] Password [email or web address] 01/10/2017, 12:40 PM   LOS: 4 days

## 2017-01-10 NOTE — Progress Notes (Signed)
Spoke with IR. Staff working on getting patient an appt set up. Will be in contact with nursing staff.

## 2017-01-10 NOTE — Progress Notes (Signed)
Patient ID: Kelli [name], female   DOB: 1950/03/23, 66 y.o.   MRN: [id number]  Request for tunneled dialysis catheter placement  Will plan for 11/13 See npo orders Will discuss with RN plan for procedure and time  Pt will return to [facility] after procedure

## 2017-01-10 NOTE — Progress Notes (Addendum)
Spoke with IR, plan for catheter placement 11/13. Transport via [employer]  for roundtrip, arrive at [facility] 11/13 by noon.
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
## 2017-01-11 ENCOUNTER — Encounter (HOSPITAL_COMMUNITY): Payer: 59

## 2017-01-11 ENCOUNTER — Ambulatory Visit (HOSPITAL_COMMUNITY): Payer: Medicare Other

## 2017-01-11 ENCOUNTER — Ambulatory Visit (HOSPITAL_COMMUNITY): Payer: 59

## 2017-01-11 ENCOUNTER — Encounter (HOSPITAL_COMMUNITY)
Admission: RE | Admit: 2017-01-11 | Discharge: 2017-01-11 | Disposition: A | Discharge status: Home / Self Care | Payer: 59 | Source: Ambulatory Visit | Attending: Nephrology | Admitting: Nephrology

## 2017-01-11 DIAGNOSIS — N170 Acute kidney failure with tubular necrosis: Secondary | ICD-10-CM

## 2017-01-11 DIAGNOSIS — D631 Anemia in chronic kidney disease: Secondary | ICD-10-CM | POA: Insufficient documentation

## 2017-01-11 DIAGNOSIS — N184 Chronic kidney disease, stage 4 (severe): Secondary | ICD-10-CM

## 2017-01-11 DIAGNOSIS — N185 Chronic kidney disease, stage 5: Secondary | ICD-10-CM | POA: Insufficient documentation

## 2017-01-11 LAB — CBC
HEMATOCRIT: 31.8 % — AB (ref 36.0–46.0)
HEMATOCRIT: 30.8 % — AB (ref 36.0–46.0)
HEMOGLOBIN: 10.6 g/dL — AB (ref 12.0–15.0)
HEMOGLOBIN: 10.2 g/dL — AB (ref 12.0–15.0)
MCH: 28.8 pg (ref 26.0–34.0)
MCH: 28.7 pg (ref 26.0–34.0)
MCHC: 33.3 g/dL (ref 30.0–36.0)
MCHC: 33.1 g/dL (ref 30.0–36.0)
MCV: 86.4 fL (ref 78.0–100.0)
MCV: 86.8 fL (ref 78.0–100.0)
Platelets: 410 10*3/uL — ABNORMAL HIGH (ref 150–400)
Platelets: 411 10*3/uL — ABNORMAL HIGH (ref 150–400)
RBC: 3.68 MIL/uL — ABNORMAL LOW (ref 3.87–5.11)
RBC: 3.55 MIL/uL — ABNORMAL LOW (ref 3.87–5.11)
RDW: 15 % (ref 11.5–15.5)
RDW: 15 % (ref 11.5–15.5)
WBC: 15.5 10*3/uL — AB (ref 4.0–10.5)
WBC: 15.2 10*3/uL — ABNORMAL HIGH (ref 4.0–10.5)

## 2017-01-11 LAB — GLUCOSE, CAPILLARY
GLUCOSE-CAPILLARY: 198 mg/dL — AB (ref 65–99)
GLUCOSE-CAPILLARY: 181 mg/dL — AB (ref 65–99)
Glucose-Capillary: 207 mg/dL — ABNORMAL HIGH (ref 65–99)
Glucose-Capillary: 218 mg/dL — ABNORMAL HIGH (ref 65–99)
Glucose-Capillary: 243 mg/dL — ABNORMAL HIGH (ref 65–99)

## 2017-01-11 LAB — RENAL FUNCTION PANEL
ALBUMIN: 3.2 g/dL — AB (ref 3.5–5.0)
ANION GAP: 14 (ref 5–15)
BUN: 132 mg/dL — ABNORMAL HIGH (ref 6–20)
CO2: 18 mmol/L — ABNORMAL LOW (ref 22–32)
Calcium: 7.2 mg/dL — ABNORMAL LOW (ref 8.9–10.3)
Chloride: 100 mmol/L — ABNORMAL LOW (ref 101–111)
Creatinine, Ser: 6.34 mg/dL — ABNORMAL HIGH (ref 0.44–1.00)
GFR calc Af Amer: 7 mL/min — ABNORMAL LOW (ref >60)
GFR calc non Af Amer: 6 mL/min — ABNORMAL LOW (ref >60)
GLUCOSE: 209 mg/dL — AB (ref 65–99)
PHOSPHORUS: 7.3 mg/dL — AB (ref 2.5–4.6)
Potassium: 6.1 mmol/L — ABNORMAL HIGH (ref 3.5–5.1)
Sodium: 132 mmol/L — ABNORMAL LOW (ref 135–145)

## 2017-01-11 MED ORDER — BISACODYL 10 MG RE SUPP
10.0000 mg | Freq: Once | RECTAL | Status: AC
  Administered 2017-01-11: 10 mg via RECTAL
  Filled 2017-01-11: qty 1

## 2017-01-11 MED ORDER — LIDOCAINE HCL (PF) 1 % IJ SOLN: 5.0000 mL | INTRAMUSCULAR | Status: DC | PRN

## 2017-01-11 MED ORDER — IPRATROPIUM-ALBUTEROL 0.5-2.5 (3) MG/3ML IN SOLN
3.0000 mL | Freq: Four times a day (QID) | RESPIRATORY_TRACT | Status: DC
  Administered 2017-01-11 – 2017-01-13 (×8): 3 mL via RESPIRATORY_TRACT
  Filled 2017-01-11 (×8): qty 3

## 2017-01-11 MED ORDER — ORAL CARE MOUTH RINSE
15.0000 mL | Freq: Two times a day (BID) | OROMUCOSAL | Status: DC
  Administered 2017-01-11 – 2017-01-27 (×21): 15 mL via OROMUCOSAL

## 2017-01-11 MED ORDER — PENTAFLUOROPROP-TETRAFLUOROETH EX AERO: 1.0000 "application " | INHALATION_SPRAY | CUTANEOUS | Status: DC | PRN

## 2017-01-11 MED ORDER — ALTEPLASE 2 MG IJ SOLR
2.0000 mg | Freq: Once | INTRAMUSCULAR | Status: DC | PRN
  Filled 2017-01-11: qty 2

## 2017-01-11 MED ORDER — SODIUM CHLORIDE 0.9 % IV SOLN: 100.0000 mL | INTRAVENOUS | Status: DC | PRN

## 2017-01-11 MED ORDER — HEPARIN SODIUM (PORCINE) 1000 UNIT/ML DIALYSIS
1000.0000 [IU] | INTRAMUSCULAR | Status: DC | PRN
Start: 2017-01-11 — End: 2017-01-21
  Administered 2017-01-19: 1000 [IU] via INTRAVENOUS_CENTRAL
  Filled 2017-01-11 (×2): qty 1

## 2017-01-11 MED ORDER — LIDOCAINE-PRILOCAINE 2.5-2.5 % EX CREA: 1.0000 "application " | TOPICAL_CREAM | CUTANEOUS | Status: DC | PRN

## 2017-01-11 MED ORDER — PREDNISONE 20 MG PO TABS
50.0000 mg | ORAL_TABLET | Freq: Every day | ORAL | Status: DC
  Administered 2017-01-12: 50 mg via ORAL
  Filled 2017-01-11: qty 2

## 2017-01-11 NOTE — Evaluation (Signed)
Occupational Therapy Evaluation Patient Details Name: Kelli [name] MRN: [id number] DOB: [birth year] Today's Date: 01/11/2017    History of Present Illness 66 yo female with onset of SOB and weakness was admitted, has notable L hemi presentation with chronic O2 use with low CO2, low functional level at baseline.  PMHx:  stroke, CKD, CHF, DM, HTN, L humeral fracture, carotid occlusion, a-fib, low CO2.   Clinical Impression   Patient evaluated by Occupational Therapy with no further acute OT needs identified. Patient is at baseline with BADLs.  Therapist did educated patient on technique to form a lining for her left resting hand splint.  All education has been completed and the patient has no further questions.  OT is signing off. Thank you for this referral.    Follow Up Recommendations  No OT follow up    Equipment Recommendations       Recommendations for Other Services       Precautions / Restrictions Precautions Precautions: Fall Restrictions Weight Bearing Restrictions: No      Mobility Bed Mobility                  Transfers                      Balance                                           ADL either performed or assessed with clinical judgement   ADL                                         General ADL Comments: patient recieves total assist with bathing, dressing, toileting and set up with grooming and feeding.  this is patient's baseline      Vision Baseline Vision/History: No visual deficits       Perception     Praxis      Pertinent Vitals/Pain Pain Assessment: 0-10 Pain Score: 5  Pain Location: knees and ankles Pain Descriptors / Indicators: Sore Pain Intervention(s): Limited activity within patient's tolerance     Hand Dominance Right   Extremity/Trunk Assessment Upper Extremity Assessment Upper Extremity Assessment: (RUE WFL, LUE P/ROM is WFL) LUE Coordination: decreased gross  motor;decreased fine motor   Lower Extremity Assessment Lower Extremity Assessment: Defer to PT evaluation       Communication Communication Communication: No difficulties   Cognition Arousal/Alertness: Awake/alert Behavior During Therapy: WFL for tasks assessed/performed Overall Cognitive Status: Within Functional Limits for tasks assessed                                     General Comments       Exercises     Shoulder Instructions      Home Living Family/patient expects to be discharged to:: Private residence Living Arrangements: Children Available Help at Discharge: Family Type of Home: House Home Access: Ramped entrance     Home Layout: Two level Alternate Level Stairs-Number of Steps: patient stays on first floor Alternate Level Stairs-Rails: None Bathroom Shower/Tub: Teacher, early years/pre: Standard Bathroom Accessibility: Yes   Home Equipment: Environmental consultant - 2 wheels;Bedside commode;Shower seat;Wheelchair - manual  Prior Functioning/Environment Level of Independence: Needs assistance    ADL's / Homemaking Assistance Needed: husband present for todays evaluation.  Patient is able to complete feeding and grooming with right hand after setup when she feels up to it.  Is total assist for dressing, bathing, toileting.              OT Problem List: Decreased strength;Decreased range of motion      OT Treatment/Interventions:   Evaluation completed    OT Goals(Current goals can be found in the care plan section)    OT Frequency:  one time eval   Barriers to D/C:            Co-evaluation              AM-PAC PT "6 Clicks" Daily Activity     Outcome Measure                 End of Session    Activity Tolerance: Patient tolerated treatment well Patient left: in bed;with family/visitor present  OT Visit Diagnosis: Muscle weakness (generalized) (M62.81)                Time: 2767-0110 OT Time Calculation  (min): 12 min Charges:  OT General Charges $OT Visit: 1 Visit OT Evaluation $OT Eval Low Complexity: 1 Low G-CodesVangie Bicker, [place], OTR/L [phone]  01/11/2017, 10:05 AM

## 2017-01-11 NOTE — Progress Notes (Addendum)
Subjective: Interval History: has complaints of poor appetite.  She has some nausea but no vomiting..  Objective: Vital signs in last 24 hours: Temp:  [97.9 F (36.6 C)-98.2 F (36.8 C)] 98 F (36.7 C) (11/13 0600) Pulse Rate:  [70-79] 70 (11/13 0600) Resp:  [18-20] 20 (11/13 0600) BP: (129-144)/(68-85) 144/75 (11/13 0600) SpO2:  [91 %-96 %] 94 % (11/13 0807) Weight:  [87.7 kg (193 lb 5.9 oz)-88.7 kg (195 lb 9.6 oz)] 88.7 kg (195 lb 9.6 oz) (11/13 0630) Weight change: -0.016 kg (-0.6 oz)  Intake/Output from previous day: 11/12 0701 - 11/13 0700 In: 862 [P.O.:780; I.V.:20; IV Piggyback:62] Out: 1400 [Urine:1400] Intake/Output this shift: No intake/output data recorded.  General appearance: alert, cooperative and no distress Resp: diminished breath sounds bilaterally Cardio: regular rate and rhythm Extremities: edema She has 1+ edema  Lab Results: Recent Labs    01/11/17 0615  WBC 15.5*  HGB 10.6*  HCT 31.8*  PLT 410*   BMET:  Recent Labs    01/10/17 0535 01/11/17 0615  NA 131* 132*  K 5.0 6.1*  CL 99* 100*  CO2 17* 18*  GLUCOSE 205* 209*  BUN 112* 132*  CREATININE 5.86* 6.34*  CALCIUM 6.8* 7.2*   No results for input(s): PTH in the last 72 hours. Iron Studies: No results for input(s): IRON, TIBC, TRANSFERRIN, FERRITIN in the last 72 hours.  Studies/Results: Dg Chest Port 1 View  Result Date: 01/09/2017 CLINICAL DATA:  CHF EXAM: PORTABLE CHEST 1 VIEW COMPARISON:  01/07/2017 FINDINGS: Severe bilateral airspace disease similar to the prior study. Probable congestive heart failure with edema. Pneumonia not excluded. Bibasilar atelectasis and small left effusion. Right jugular central venous catheter tip in the SVC unchanged IMPRESSION: Diffuse bilateral airspace disease unchanged. Probable heart failure. Electronically Signed   By: Franchot Gallo M.D.   On: 01/09/2017 11:27    I have reviewed the patient's current medications.  Assessment/Plan: Problem #1  acute kidney injury superimposed on chronic: Presently her BUN and creatinine seems to be progressively worsening.  Patient also seems to be developing some uremic signs and symptoms. 2] hyperkalemia: Her potassium is high 3] history of CHF: Presently on diuretics.  She had 1400 cc of urine output.  Still complains of some difficulty breathing.  4] bone and mineral disorder: Her calcium is in  range but  phosphorus is high.  Patient is on binder 5 5] anemia: Her hemoglobin is within her target goal 6] hypertension: Her blood pressure is reasonably controlled Plan: We will DC her transfer to assess: For tunnel catheter placement 2] will make arrangement for her to have a femoral catheter and dialyze her today for 2 hours and for 3-1/2 hours tomorrow 3] once patient is stable then we will send her for tunnel catheter placement. 4] we will give her IV calcium, D50 and insulin until hemodialysis is started.   LOS: 5 days   Kelli [name] S 01/11/2017,8:54 AM

## 2017-01-11 NOTE — Progress Notes (Signed)
PROGRESS NOTE  Kelli [name] SKA:[id number] DOB: [birth year] DOA: 01/06/2017 PCP: Manon Hilding, MD   Brief History: 66 year old female with a history of stroke with residual left hemiparesis, CKD stage III, essential hypertension, diabetes mellitus, peripheral vascular disease presentedwith 1 day history of shortness of breath, worsening leg edema, and chest discomfort. The patient also complains of a cough with clear sputum. She had subjective fevers and chills, but denied any headache, neck pain.The patient has chronic nausea and vomiting each morning which has been unchanged. She denies any hematemesis, hematochezia, melena. She complains of intermittent abdominal pain with loose stools. There is no dysuria, hematuria.Upon presentation, the patient was noted to have a hemoglobin of 7.6. Chest x-ray showed pulmonary edema with BNP 966. The patientwas started on IV Lasix.She had symptomatic improvement, but remained fluid overloaded and her renal function continued to worsen. Repeat chest x-ray revealed pulmonary edema. Nephrology was consulted to assist with management and possible initiation of dialysis.  Dr. Constance Haw placed VasCath on 01/11/17.  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 -worsening renal function with diuresis, progression of CKD -remains clinically fluid overloaded -needs initiation of  HD -nephrology following -Baseline creatinine 4.5-4.8 -11/13--placed femoral VasCath-->start HD -will need more permanent HD access prior to d/c -CLIP process per nephrology  COPD exacerbation ->40pk year hx -wheezing improving -continue IV solumedrol>>>wean to po -continue Duonebs -continue pulmicort  Symptomatic anemia -December 28, 2016 hemoglobin 13.7 -Check LDH and haptoglobin--not consistent with hemolysis -FOBTnegative -CT abdomen pelvis--NEGretroperitoneal bleed -Hgb stable since transfusion  Elevated troponin -Troponin trend flat -Secondary to CKD and decompensated CHF -pt also has loop recorder placed Feb 2015 after stroke  Essential hypertension -Continue metoprolol succinate -Continue hydralazine  Hx of Stroke with left hemiparesis  -continue ASA -check lipid panel--LDL57 -pt has loop recorder placed Feb 2015 -continue statin -PT eval--pt refuses SNF  Diabetes mellitus type 2, controlled -October 15, 2016 hemoglobin A1c 5.7 -NovoLog sliding scale  Hyperlipidemia -Continue statin  Goals of Care -01/08/17--long discussion with spouse at bedside--they want full scope of care -palliative medicine following   Disposition Plan: Not stable for dc Family Communication:Spouse updated at bedside 11/13   Consultants:cardiology, nephrology, general surgery  Code Status: FULL  DVT Prophylaxis: [place] Lovenox   Procedures: As Listed in Progress Note Above  Antibiotics: None     Subjective: Patient denies any fevers, chills, chest pain, abdominal pain.  She has some shortness of breath with some nausea.  She denies any dysuria, hematuria, headache, neck pain.  Objective: Vitals:   01/11/17 0805 01/11/17 0807 01/11/17 1217 01/11/17 1456  BP:    137/77  Pulse:    80  Resp:    20  Temp:    97.8 F (36.6 C)  TempSrc:    Oral  SpO2: 94% 94% 94% 97%  Weight:      Height:        Intake/Output Summary (Last 24 hours) at  01/11/2017 1517 Last data filed at 01/11/2017 1200 Gross per 24 hour  Intake 560 ml  Output 1200 ml  Net -640 ml   Weight change: -0.016 kg (-0.6 oz) Exam:   General:  Pt is alert, follows commands appropriately, not in acute distress  HEENT: No icterus, No thrush, No neck mass, [place]/AT  Cardiovascular: RRR, S1/S2, no rubs, no gallops  Respiratory: Bilateral scattered crackles.  No wheezing.  Good air movement.  Abdomen: Soft/+BS, non tender, non distended, no guarding  Extremities: 1 + LE edema, No lymphangitis, No petechiae, No rashes, no synovitis   Data Reviewed: I have personally reviewed following labs and imaging studies Basic Metabolic Panel: Recent Labs  Lab 01/07/17 0630 01/08/17 0634 01/09/17 0653 01/10/17 0535 01/11/17 0615  NA 133* 132* 134* 131* 132*  K 3.3* 3.4* 4.1 5.0 6.1*  CL 103 102 103 99* 100*  CO2 16* 17* 18* 17* 18*  GLUCOSE 108* 214* 194* 205* 209*  BUN 54* 72* 93* 112* 132*  CREATININE 4.86* 5.01* 5.59* 5.86* 6.34*  CALCIUM 6.9* 6.9* 7.1* 6.8* 7.2*  PHOS  --   --   --  6.9* 7.3*   Liver Function Tests: Recent Labs  Lab 01/10/17 0535 01/11/17 0615  ALBUMIN 3.2* 3.2*   No results for input(s): LIPASE, AMYLASE in the last 168 hours. No results for input(s): AMMONIA in the last 168 hours. Coagulation Profile: No results for input(s): INR, PROTIME in the last 168 hours. CBC: Recent Labs  Lab 01/06/17 1607 01/07/17 0630 01/11/17 0615 01/11/17 1340  WBC 11.6*  --  15.5* 15.2*  HGB 7.6* 10.1* 10.6* 10.2*  HCT 22.3* 30.1* 31.8* 30.8*  MCV 86.8  --  86.4 86.8  PLT 304  --  410* 411*   Cardiac Enzymes: Recent Labs  Lab 01/06/17 1607 01/06/17 2109 01/07/17 0630 01/07/17 1321  TROPONINI 0.06* 0.06* 0.07* 0.06*   BNP: Invalid input(s): POCBNP CBG: Recent Labs  Lab 01/10/17 1649 01/10/17 2120 01/11/17 0430 01/11/17 0758 01/11/17 1144  GLUCAP 178* 239* 207* 198* 218*   HbA1C: No results for input(s): HGBA1C in the last  72 hours. Urine analysis:    Component Value Date/Time   COLORURINE YELLOW 01/10/2017 1150   APPEARANCEUR CLOUDY (A) 01/10/2017 1150   LABSPEC 1.011 01/10/2017 1150   PHURINE 7.0 01/10/2017 1150   GLUCOSEU NEGATIVE 01/10/2017 1150   HGBUR NEGATIVE 01/10/2017 1150   BILIRUBINUR NEGATIVE 01/10/2017 1150   KETONESUR NEGATIVE 01/10/2017 1150   PROTEINUR 100 (A) 01/10/2017 1150   UROBILINOGEN 0.2 04/25/2014 2315   NITRITE NEGATIVE 01/10/2017 1150   LEUKOCYTESUR MODERATE (A) 01/10/2017 1150   Sepsis Labs: @LABRCNTIP (procalcitonin:4,lacticidven:4) )No results found for this or any previous visit (from the past 240 hour(s)).   Scheduled Meds: . aspirin EC  81 mg Oral Daily  . atorvastatin  40 mg Oral q1800  . budesonide (PULMICORT) nebulizer solution  0.5 mg Nebulization BID  . calcitRIOL  0.25 mcg Oral Daily  . calcium acetate  667 mg Oral TID WC  . [START ON 01/12/2017] enoxaparin (LOVENOX) injection  30 mg Subcutaneous Q24H  . hydrALAZINE  50 mg Oral TID  . insulin aspart  0-15 Units Subcutaneous TID WC  . insulin aspart  0-5 Units Subcutaneous QHS  . ipratropium-albuterol  3 mL Nebulization Q4H WA  . mouth rinse  15 mL Mouth Rinse BID  . methylPREDNISolone (SOLU-MEDROL) injection  60 mg Intravenous Q8H  . metoprolol succinate  100 mg Oral Daily  . sodium bicarbonate  650 mg Oral TID  . sodium chloride flush  3 mL Intravenous Q12H  . venlafaxine XR  150 mg Oral Q breakfast   Continuous Infusions: . sodium chloride    . sodium chloride    . sodium chloride    . sodium chloride    . furosemide Stopped (01/11/17 1014)    Procedures/Studies: Ct Abdomen Pelvis Wo Contrast  Result Date: 01/07/2017 CLINICAL DATA:  Abdominal pain. Decreased hemoglobin. Clinical suspicion for retroperitoneal hemorrhage. EXAM: CT ABDOMEN AND PELVIS WITHOUT CONTRAST TECHNIQUE: Multidetector CT imaging of the abdomen and pelvis was performed following the standard protocol without IV contrast.  COMPARISON:  None. FINDINGS: Lower chest: Bibasilar heterogeneous airspace disease and small bilateral pleural effusions. Hepatobiliary: No mass visualized on this unenhanced exam. Gallbladder is unremarkable. Pancreas: No mass or inflammatory process visualized on this unenhanced exam. Spleen:  Within normal limits in size. Adrenals/Urinary tract: Bilateral renal parenchymal atrophy, left side greater than right, consistent with history of chronic kidney disease. Renal vascular calcification noted. Several tiny calculi are seen within the left renal collecting system and proximal left ureter, without hydronephrosis. Unremarkable unopacified urinary bladder. Stomach/Bowel: No evidence of obstruction, inflammatory process, or abnormal fluid collections. Normal appendix visualized. Mild left-sided colonic diverticulosis seen, without evidence of diverticulitis. Vascular/Lymphatic: No pathologically enlarged lymph nodes identified. Infrarenal abdominal aortic aneurysm measuring 3.4 x 3.3 cm. Reproductive: Prior hysterectomy noted. Adnexal regions are unremarkable in appearance. Other: No evidence of retroperitoneal hemorrhage or hemoperitoneum. Mild edema in subcutaneous tissues. Musculoskeletal:  No suspicious bone lesions identified. IMPRESSION: No evidence of retroperitoneal hemorrhage or hemoperitoneum. Several tiny calculi in left renal collecting system and proximal left ureter, without hydronephrosis. Bilateral renal parenchymal atrophy, left side greater than right. Colonic diverticulosis, without radiographic evidence of diverticulitis. Small bilateral pleural effusions and bilateral lower lobe airspace disease. 3.4 cm infrarenal abdominal aortic aneurysm. Recommend followup by ultrasound in 3 years. This recommendation follows ACR consensus guidelines: White Paper of the ACR Incidental Findings Committee II on Vascular Findings. Natasha Mead Coll Radiol 2013; 10:789-794 Electronically Signed   By: Earle Gell M.D.    On: 01/07/2017 10:22   Dg Chest 2 View  Result Date: 01/06/2017 CLINICAL DATA:  Trouble breathing x1 day with tightness and congestion in chest. Weakness. Hx of diabetes, HTN-controlled with medication, and low hemoglobin. 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 EXAM: CHEST  2 VIEW COMPARISON:  10/19/2016 FINDINGS: The heart is enlarged. There is pulmonary vascular congestion. Prominent interstitial markings are consistent with mild edema. Trace bilateral pleural effusions are also suspected. There is mild bibasilar atelectasis. IMPRESSION: Cardiomegaly and mild pulmonary edema. Electronically Signed   By: Nolon Nations M.D.   On: 01/06/2017 16:56   Dg Chest Port 1 View  Result Date: 01/09/2017 CLINICAL DATA:  CHF EXAM: PORTABLE CHEST 1 VIEW COMPARISON:  01/07/2017 FINDINGS: Severe bilateral airspace disease similar to the prior study. Probable congestive heart failure with edema. Pneumonia not excluded. Bibasilar atelectasis and small left effusion. Right jugular central venous catheter tip in the SVC unchanged IMPRESSION: Diffuse bilateral airspace disease unchanged. Probable heart failure. Electronically Signed   By: Franchot Gallo M.D.   On: 01/09/2017 11:27   Dg Chest Port 1 View  Result Date: 01/07/2017 CLINICAL DATA:  66 year old female central line placement. EXAM: PORTABLE CHEST 1 VIEW COMPARISON:  1016 hours today and earlier. FINDINGS: Portable AP semi upright view at 1432 hours. New right IJ approach central line is in place, tip projects at the level of the right mainstem bronchus corresponding to the lower SVC. No pneumothorax. Stable cardiac size and mediastinal contours. Stable bilateral ventilation since  this morning including dense retrocardiac opacity which has progressed since 01/06/2017. Visualized tracheal air column is within normal limits. IMPRESSION: 1. Right IJ central line tip at the lower SVC level. No pneumothorax or adverse features. 2. Stable ventilation  since 1016 hours today. Electronically Signed   By: Genevie Ann M.D.   On: 01/07/2017 15:02   Dg Chest Port 1 View  Result Date: 01/07/2017 CLINICAL DATA:  Respiratory distress. EXAM: PORTABLE CHEST 1 VIEW COMPARISON:  01/06/2017 FINDINGS: Lungs are adequately inflated with worsening patchy multifocal airspace opacification most notable over the left midlung and right upper lobe likely a multifocal infectious process. Possible small amount left pleural fluid. Mild stable cardiomegaly. Remainder of the exam is unchanged. IMPRESSION: Slight worsening bilateral multifocal airspace process most notable over the left midlung and right upper lobe suggesting multifocal infection. Stable cardiomegaly. Electronically Signed   By: Marin Olp M.D.   On: 01/07/2017 10:37    Daryl Quiros, DO  [facility] Pager [phone]  If 7PM-7AM, please contact night-coverage [email or web address] Password [email or web address] 01/11/2017, 3:17 PM   LOS: 5 days

## 2017-01-11 NOTE — Progress Notes (Signed)
Pt still doing dialysis waiting to do pt's breathing treatment.

## 2017-01-11 NOTE — Progress Notes (Signed)
Pt doing dialysis no treatment at this time

## 2017-01-11 NOTE — Care Management Note (Signed)
Case Management Note  Patient Details  Name: KATHYLEEN RADICE MRN: [id number] Date of Birth: [birth year]  If discussed at Long Length of Stay Meetings, dates discussed:  01/11/2017  Additional Comments:  Sherald Barge, RN 01/11/2017, 1:08 PM

## 2017-01-11 NOTE — Progress Notes (Signed)
Order for pt to be transferred to [facility] for catheter placement has been cancelled. Will get a femoral catheter placed at bedside by Dr. Constance Haw today. Pt and all staff involved made aware. Will continue to monitor.

## 2017-01-11 NOTE — Procedures (Signed)
Procedure Note  01/11/17    Preoperative Diagnosis: Acute on chronic renal failure    Postoperative Diagnosis: Same   Procedure(s) Performed: Temporary hemodialysis access placement, left femoral vein under ultrasound guidance    Surgeon: Lanell Matar. Constance Haw, MD   Assistants: None   Anesthesia: 1% lidocaine    Complications: None    Indications: Kelli [name] is a 66 y.o. with acute on chronic renal failure with an elevated K today in need of temporary dialysis access in order to receive dialysis today.  I have spoke with Dr. Berton Bon and he plans to do dialysis as soon as she gets a catheter, and is giving her medical therapy until that time.  I discussed the risk and benefits of placement of the femoral dialysis catheter placement with the patient and her husband, including but not limited to bleeding, infection, and risk of it not functioning/ clotting off.  The husband and patient gave consent.     Procedure: The patient placed supine. Using an ultrasound the left femoral vein was identified and was clearly open and of a sufficient size.  The left groin and leg was prepped and draped in the usual sterile fashion.  Wearing full gown and gloves, I performed the procedure.  One percent lidocaine was used for local anesthesia.  An ultrasound was utilized to assess the femoral vein.  The needle with syringe was advanced into the femoral vein with dark venous return, and a wire was placed using the Seldinger technique without difficulty. An ultrasound image was captured with the wire in the vein (see below).  The skin was knicked and a dilator was placed, and the two lumen dialysis catheter was placed over the wire with continued control of the wire.  There was good draw back of blood from all lumens and each flushed easily with saline.  The catheter was secured in 2 points with 2-0 silk and a biopatch and dressing was placed.     The dialysis RN was notified of the placement in order to assess  appropriate heparin flushing, and to allow for quick dialysis.    The patient tolerated the procedure well.     Kelli Labrum, MD [facility] [address] [place], [place] [address] [phone] (office)

## 2017-01-11 NOTE — Progress Notes (Signed)
[facility]  Discussed with Dr. Lowanda Foster. Plan for femoral vascath later today. Have discussed with the patient and her husband the risk and bleeding, infection, need for replacement line given that this is temporary.  Will perform as soon as possible.   Curlene Labrum, MD [facility] [address] [place], [place] [address] [phone] (office)

## 2017-01-11 NOTE — Progress Notes (Signed)
Daily Progress Note   Patient Name: Kelli [name]       Date: 01/11/2017 DOB: 03-Mar-1950  Age: 66 y.o. MRN#: [id number] Attending Physician: Orson Eva, MD Primary Care Physician: Manon Hilding, MD Admit Date: 01/06/2017  Reason for Consultation/Follow-up: Establishing goals of care and Psychosocial/spiritual support  Subjective: Kelli [name] is resting quietly in bed.  She appears acutely/chronically ill.  She will briefly make but not keep eye contact, stating she does not feel well.  Present today at bedside his sister, Alphonse Guild.  We talked about the plan for emergent dialysis today, HD catheter in the groin, with plan for tunneled catheter when she is stable.  We have a very brief conversation today due to Kelli [name]'s frailty, confusion.  We briefly review palliative medicine.  I encouraged Kelli [name] to call with any questions or concerns.  Length of Stay: 5  Current Medications: Scheduled Meds:  . aspirin EC  81 mg Oral Daily  . atorvastatin  40 mg Oral q1800  . budesonide (PULMICORT) nebulizer solution  0.5 mg Nebulization BID  . calcitRIOL  0.25 mcg Oral Daily  . calcium acetate  667 mg Oral TID WC  . [START ON 01/12/2017] enoxaparin (LOVENOX) injection  30 mg Subcutaneous Q24H  . hydrALAZINE  50 mg Oral TID  . insulin aspart  0-15 Units Subcutaneous TID WC  . insulin aspart  0-5 Units Subcutaneous QHS  . ipratropium-albuterol  3 mL Nebulization Q4H WA  . mouth rinse  15 mL Mouth Rinse BID  . methylPREDNISolone (SOLU-MEDROL) injection  60 mg Intravenous Q8H  . metoprolol succinate  100 mg Oral Daily  . sodium bicarbonate  650 mg Oral TID  . sodium chloride flush  3 mL Intravenous Q12H  . venlafaxine XR  150 mg Oral Q breakfast    Continuous Infusions: . sodium chloride    .  sodium chloride    . sodium chloride    . sodium chloride    . furosemide Stopped (01/11/17 1014)    PRN Meds: sodium chloride, sodium chloride, sodium chloride, acetaminophen, alteplase, alum & mag hydroxide-simeth, heparin, lidocaine (PF), lidocaine-prilocaine, ondansetron (ZOFRAN) IV, pentafluoroprop-tetrafluoroeth, sodium chloride flush  Physical Exam  Constitutional: She appears ill.  Briefly makes but does not keep eye contact, appears acutely/chronically ill.  HENT:  Head: Atraumatic.  Pulmonary/Chest:  Effort normal. No respiratory distress.  Productive cough  Abdominal: Soft. She exhibits no distension.  Neurological: She is alert.  Skin: Skin is warm and dry.  Psychiatric: Her mood appears anxious.  Nursing note and vitals reviewed.           Vital Signs: BP (!) 144/75 (BP Location: Left Arm)   Pulse 70   Temp 98 F (36.7 C) (Oral)   Resp 20   Ht 5\' 5"  (1.651 m)   Wt 88.7 kg (195 lb 9.6 oz)   SpO2 94%   BMI 32.55 kg/m  SpO2: SpO2: 94 % O2 Device: O2 Device: Nasal Cannula O2 Flow Rate: O2 Flow Rate (L/min): 3 L/min  Intake/output summary:   Intake/Output Summary (Last 24 hours) at 01/11/2017 1133 Last data filed at 01/11/2017 0800 Gross per 24 hour  Intake 742 ml  Output 1200 ml  Net -458 ml   LBM: Last BM Date: 01/07/17 Baseline Weight: Weight: 83.5 kg (184 lb) Most recent weight: Weight: 88.7 kg (195 lb 9.6 oz)       Palliative Assessment/Data:    Flowsheet Rows     Most Recent Value  Intake Tab  Referral Department  Hospitalist  Unit at Time of Referral  Cardiac/Telemetry Unit  Palliative Care Primary Diagnosis  Nephrology  Date Notified  01/09/17  Palliative Care Type  New Palliative care  Reason for referral  Clarify Goals of Care  Date of Admission  01/06/17  Date first seen by Palliative Care  01/10/17  # of days Palliative referral response time  1 Day(s)  # of days IP prior to Palliative referral  3  Clinical Assessment  Palliative  Performance Scale Score  30%  Pain Max last 24 hours  Not able to report  Pain Min Last 24 hours  Not able to report  Dyspnea Max Last 24 Hours  Not able to report  Dyspnea Min Last 24 hours  Not able to report  Psychosocial & Spiritual Assessment  Palliative Care Outcomes  Patient/Family meeting held?  Yes  Who was at the meeting?  Patient and Sister Alphonse Guild at bedside  [place] goals of care, Provided psychosocial or spiritual support      Patient Active Problem List   Diagnosis Date Noted  . Palliative care encounter   . Goals of care, counseling/discussion   . DNR (do not resuscitate) discussion   . Acute renal failure superimposed on stage 4 chronic kidney disease ([place]) 01/09/2017  . Precordial pain   . CKD (chronic kidney disease), stage IV ([place]) 01/07/2017  . Acute respiratory failure with hypoxia ([place]) 01/07/2017  . Symptomatic anemia 01/06/2017  . Diabetes mellitus type 2 in obese ([place]) 01/06/2017  . B12 deficiency 11/18/2016  . Anemia in chronic kidney disease 11/18/2016  . PUD (peptic ulcer disease) 10/13/2016  . Pacemaker 10/13/2016  . Acute on chronic diastolic CHF (congestive heart failure) ([place]) 10/13/2016  . Elevated troponin   . Hypovolemic shock ([place]) 04/26/2014  . Upper GI bleed   . Leukocytosis, unspecified 07/30/2013  . Thrombocytosis ([place]) 07/30/2013  . Subluxation of left shoulder joint 07/27/2013  . Closed dislocation of left humerus 07/26/2013  . Pyelonephritis 07/25/2013  . Acute on chronic renal failure ([place]) 07/25/2013  . Normocytic anemia 07/25/2013  . Hyponatremia 07/25/2013  . Spastic hemiplegia affecting nondominant side ([place]) 07/06/2013  . 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 UTI (urinary tract infection) 06/15/2013  . Edema 05/28/2013  .  Knee pain 05/28/2013  . Carotid stenosis, bilateral 05/22/2013  . Fall at nursing home 05/20/2013  . Hip pain 05/20/2013    . Pain in joint, lower leg 05/19/2013  . Chest congestion 05/12/2013  . E. coli UTI 05/07/2013  . Left shoulder pain due to subluxation and/or adhesive capsulitis.  05/07/2013  . Acute CVA (cerebrovascular accident): R PCA infarct per MRI 04/13/13 04/13/2013  . CVA (cerebral infarction) 04/08/2013  . Acute ischemic stroke ([place]) 04/08/2013  . Diplopia 04/08/2013  . Left-sided weakness 04/08/2013  . Vertigo 04/08/2013  . Paresthesias in left hand 04/08/2013  . Cerebellar stroke ([place]) 04/08/2013  . Dizziness and giddiness 04/08/2013  . Stroke ([place]) 04/08/2013  . Depression 04/06/2013  . Peripheral arterial disease ([place]) 03/16/2013  . History of stroke 01/30/2013  . Anxiety state, unspecified 12/08/2012  . Lumbago 12/08/2012  . Urinary frequency 11/14/2012  . Occlusion and stenosis of carotid artery with cerebral infarction 10/05/2012  . Mixed hyperlipidemia 10/05/2012  . Renal insufficiency 10/05/2012  . HTN (hypertension) 10/04/2012  . Tobacco abuse 10/04/2012  . Obesity 10/04/2012    Palliative Care Assessment & Plan   Patient Profile: 66 y.o. 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   Assessment: heart failure, worsening kidney disease: Mrs. Oman is scheduled for emergency hemodialysis catheter placement today with emergent dialysis of 2 hours today.  Recommendations/Plan:  Full scope treatment.   Emergent dialysis is today.  Continue CODE STATUS discussions.  Goals of Care and Additional Recommendations:  Limitations on Scope of Treatment: Full Scope Treatment  Code Status:    Code Status Orders  (From admission, onward)        Start     Ordered    01/06/17 2015  Full code  Continuous     01/06/17 2014    Code Status History    Date Active Date Inactive Code Status Order ID Comments User Context   10/13/2016 06:39 10/20/2016 22:33 Full Code [id number]  Phillips Grout, MD Inpatient   04/26/2014 02:32 04/30/2014 23:09 Full Code [id number]  Shanda Howells, MD ED   07/25/2013 12:18 07/31/2013 19:08 Full Code [id number]  Radene Gunning, NP Inpatient   04/17/2013 17:59 05/08/2013 14:03 Full Code [id number]  Bary Leriche, PA-C Inpatient   04/17/2013 17:59 04/17/2013 17:59 Full Code [id number]  Bary Leriche, PA-C Inpatient   04/16/2013 12:07 04/17/2013 17:59 Full Code [id number]  Evans Lance, MD Inpatient   04/08/2013 20:54 04/16/2013 12:07 Full Code [id number]  Bynum Bellows, MD ED   01/16/2013 23:35 01/18/2013 19:19 Full Code [id number]  Phillips Grout, MD Inpatient   10/09/2012 17:09 10/19/2012 14:27 Full Code [id number]  Elizabeth Sauer Inpatient   10/04/2012 01:28 10/09/2012 17:09 Full Code [id number]  Orvan Falconer, MD Inpatient       Prognosis:   Unable to determine, based on outcomes. 12 months or less would not be surprising based on current functional status, health history, frailty, 2 hospitalizations in 6 months, new to hemodialysis  Discharge Planning:  Likely return to home with services.  Mrs. Fenster states that she would never go to rehab again.  Care plan was discussed with nursing staff, case management, social work, and Dr. Theda Sers.  Thank you for allowing the Palliative Medicine Team to  assist in the care of this patient.   Time In:  1105 Time Out:  1120 Total Time  15 minutes Prolonged Time Billed  no       Greater than 50%  of this time was spent counseling and coordinating care related to the above assessment and plan.  Drue Novel, NP  Please contact Palliative Medicine Team phone at [phone] for questions and concerns.

## 2017-01-12 LAB — RENAL FUNCTION PANEL
ALBUMIN: 2.9 g/dL — AB (ref 3.5–5.0)
ANION GAP: 13 (ref 5–15)
BUN: 108 mg/dL — ABNORMAL HIGH (ref 6–20)
CO2: 22 mmol/L (ref 22–32)
Calcium: 7.6 mg/dL — ABNORMAL LOW (ref 8.9–10.3)
Chloride: 99 mmol/L — ABNORMAL LOW (ref 101–111)
Creatinine, Ser: 5.21 mg/dL — ABNORMAL HIGH (ref 0.44–1.00)
GFR, EST AFRICAN AMERICAN: 9 mL/min — AB (ref >60)
GFR, EST NON AFRICAN AMERICAN: 8 mL/min — AB (ref >60)
Glucose, Bld: 206 mg/dL — ABNORMAL HIGH (ref 65–99)
PHOSPHORUS: 6.5 mg/dL — AB (ref 2.5–4.6)
POTASSIUM: 4.4 mmol/L (ref 3.5–5.1)
Sodium: 134 mmol/L — ABNORMAL LOW (ref 135–145)

## 2017-01-12 LAB — CBC
HCT: 32.9 % — ABNORMAL LOW (ref 36.0–46.0)
Hemoglobin: 10.9 g/dL — ABNORMAL LOW (ref 12.0–15.0)
MCH: 28.5 pg (ref 26.0–34.0)
MCHC: 33.1 g/dL (ref 30.0–36.0)
MCV: 86.1 fL (ref 78.0–100.0)
PLATELETS: 357 10*3/uL (ref 150–400)
RBC: 3.82 MIL/uL — ABNORMAL LOW (ref 3.87–5.11)
RDW: 14.8 % (ref 11.5–15.5)
WBC: 20.4 10*3/uL — AB (ref 4.0–10.5)

## 2017-01-12 LAB — GLUCOSE, CAPILLARY
GLUCOSE-CAPILLARY: 162 mg/dL — AB (ref 65–99)
GLUCOSE-CAPILLARY: 136 mg/dL — AB (ref 65–99)
GLUCOSE-CAPILLARY: 309 mg/dL — AB (ref 65–99)
Glucose-Capillary: 171 mg/dL — ABNORMAL HIGH (ref 65–99)

## 2017-01-12 LAB — HEPATITIS B SURFACE ANTIGEN: Hepatitis B Surface Ag: NEGATIVE

## 2017-01-12 LAB — HEPATITIS B SURFACE ANTIBODY,QUALITATIVE: Hep B S Ab: NONREACTIVE

## 2017-01-12 MED ORDER — SODIUM CHLORIDE 0.9 % IV SOLN: 100.0000 mL | INTRAVENOUS | Status: DC | PRN

## 2017-01-12 MED ORDER — PREDNISONE 20 MG PO TABS
30.0000 mg | ORAL_TABLET | Freq: Every day | ORAL | Status: DC
  Administered 2017-01-13: 30 mg via ORAL
  Filled 2017-01-12: qty 1

## 2017-01-12 MED ORDER — ALTEPLASE 2 MG IJ SOLR
2.0000 mg | Freq: Once | INTRAMUSCULAR | Status: AC | PRN
  Administered 2017-01-24: 2 mg
  Filled 2017-01-12: qty 2

## 2017-01-12 MED ORDER — EPOETIN ALFA 4000 UNIT/ML IJ SOLN
4000.0000 [IU] | Freq: Once | INTRAMUSCULAR | Status: AC
  Administered 2017-01-12: 4000 [IU] via INTRAVENOUS
  Filled 2017-01-12: qty 1

## 2017-01-12 MED ORDER — TUBERCULIN PPD 5 UNIT/0.1ML ID SOLN
5.0000 [IU] | Freq: Once | INTRADERMAL | Status: AC
  Administered 2017-01-12: 5 [IU] via INTRADERMAL
  Filled 2017-01-12: qty 0.1

## 2017-01-12 MED ORDER — ONDANSETRON HCL 4 MG/2ML IJ SOLN
4.0000 mg | Freq: Once | INTRAMUSCULAR | Status: AC
  Administered 2017-01-12: 4 mg via INTRAVENOUS
  Filled 2017-01-12: qty 2

## 2017-01-12 MED ORDER — LIDOCAINE-PRILOCAINE 2.5-2.5 % EX CREA
1.0000 "application " | TOPICAL_CREAM | CUTANEOUS | Status: DC | PRN
  Filled 2017-01-12: qty 5

## 2017-01-12 MED ORDER — HEPARIN SODIUM (PORCINE) 1000 UNIT/ML DIALYSIS
1000.0000 [IU] | INTRAMUSCULAR | Status: DC | PRN
  Filled 2017-01-12: qty 1

## 2017-01-12 MED ORDER — LIDOCAINE HCL (PF) 1 % IJ SOLN: 5.0000 mL | INTRAMUSCULAR | Status: DC | PRN

## 2017-01-12 MED ORDER — PENTAFLUOROPROP-TETRAFLUOROETH EX AERO
1.0000 "application " | INHALATION_SPRAY | CUTANEOUS | Status: DC | PRN
  Filled 2017-01-12: qty 30

## 2017-01-12 MED ORDER — LORATADINE 10 MG PO TABS
10.0000 mg | ORAL_TABLET | Freq: Every day | ORAL | Status: DC
  Administered 2017-01-12 – 2017-01-27 (×14): 10 mg via ORAL
  Filled 2017-01-12 (×14): qty 1

## 2017-01-12 NOTE — Progress Notes (Signed)
PROGRESS NOTE    Kelli [name]   ZOX:[id number]  DOB: [birth year]  DOA: 01/06/2017 PCP: Manon Hilding, MD   Brief Narrative:  Kelli [name] 66 year old female with a history of stroke with residual left hemiparesis, CKD stage III, essential hypertension, diabetes mellitus, peripheral vascular disease presentedwith 1 day history of shortness of breath, worsening leg edema, and chest discomfort. The patient also complains of a cough with clear sputum. She had subjective fevers and chills, but denied any headache, neck pain.The patient has chronic nausea and vomiting each morning which has been unchanged.  She complains of intermittent abdominal pain with loose stools. There is no dysuria, hematuria.U In ER: Hb 7.6. Chest x-ray showed pulmonary edema with BNP 966. The patientwas started on IV Lasix but remained fluid overloaded and her renal function continued to worsen. Repeat chest x-ray revealed pulmonary edema. Nephrology was consulted to assist with management and possible initiation of dialysis.  Dr. Constance Haw placed VasCath on 01/11/17.   Subjective: Seen on dialysis- complains of nausea.      Assessment & Plan:   Principal Problem:   Acute on chronic renal failure/ fluid overload - now ESRD - appreciate management per nephrology  Active Problems:  acute hypoxic resp failure - fluid overload and COPD  COPD exacerbation - on Prednisone- will  Cont to wean- no wheezing -cont Nebs  Metabolic acidosis - on sodium Bicarb tabs- will d/c now that she is on dialysis    HTN (hypertension) - Hydralazine, Toprol    Mixed hyperlipidemia - Lipitor    Elevated troponin - due to fluid overload vs CKD flat trend    Symptomatic anemia - hemolysis labs negative - likely due to chronic disease - given 2 u PRBC on 11/8 and Hb since has been stable    Diabetes mellitus type 2 in obese  - cont ISS   Hx of Stroke with left hemiparesis  -continue ASA -check lipid  panel--LDL57 - refuses SNF   DVT prophylaxis: Lovenox Code Status: Full code Family Communication: husband Disposition Plan: home when stable-  Consultants:   nephro  Palliative care Procedures:   HD cath Antimicrobials:  Anti-infectives (From admission, onward)   None       Objective: Vitals:   01/12/17 1330 01/12/17 1400 01/12/17 1408 01/12/17 1424  BP: (!) 113/57 118/60 (!) 131/57   Pulse: 80 78 84   Resp:   18   Temp:   98.7 F (37.1 C)   TempSrc:   Axillary   SpO2:   98% 98%  Weight:   83.8 kg (184 lb 11.9 oz)   Height:        Intake/Output Summary (Last 24 hours) at 01/12/2017 1612 Last data filed at 01/12/2017 1408 Gross per 24 hour  Intake 373 ml  Output 4668 ml  Net -4295 ml   Filed Weights   01/12/17 0328 01/12/17 1100 01/12/17 1408  Weight: 86.8 kg (191 lb 4.8 oz) 86.8 kg (191 lb 5.8 oz) 83.8 kg (184 lb 11.9 oz)    Examination: General exam: Appears mildly uncomfortable  HEENT: PERRLA, oral mucosa moist, no sclera icterus or thrush Respiratory system: Clear to auscultation. Respiratory effort normal. Cardiovascular system: S1 & S2 heard, RRR.  No murmurs  Gastrointestinal system: Abdomen soft, non-tender, nondistended. Normal bowel sound. No organomegaly Central nervous system: Alert  No focal neurological deficits. Extremities: No cyanosis, clubbing or edema Skin: No rashes or ulcers Psychiatry:  Mood & affect appropriate.     Data Reviewed:  I have personally reviewed following labs and imaging studies  CBC: Recent Labs  Lab 01/06/17 1607 01/07/17 0630 01/11/17 0615 01/11/17 1340 01/12/17 1243  WBC 11.6*  --  15.5* 15.2* 20.4*  HGB 7.6* 10.1* 10.6* 10.2* 10.9*  HCT 22.3* 30.1* 31.8* 30.8* 32.9*  MCV 86.8  --  86.4 86.8 86.1  PLT 304  --  410* 411* 921   Basic Metabolic Panel: Recent Labs  Lab 01/08/17 0634 01/09/17 0653 01/10/17 0535 01/11/17 0615 01/12/17 0618  NA 132* 134* 131* 132* 134*  K 3.4* 4.1 5.0 6.1* 4.4  CL  102 103 99* 100* 99*  CO2 17* 18* 17* 18* 22  GLUCOSE 214* 194* 205* 209* 206*  BUN 72* 93* 112* 132* 108*  CREATININE 5.01* 5.59* 5.86* 6.34* 5.21*  CALCIUM 6.9* 7.1* 6.8* 7.2* 7.6*  PHOS  --   --  6.9* 7.3* 6.5*   GFR: Estimated Creatinine Clearance: 11.4 mL/min (A) (by C-G formula based on SCr of 5.21 mg/dL (H)). Liver Function Tests: Recent Labs  Lab 01/10/17 0535 01/11/17 0615 01/12/17 0618  ALBUMIN 3.2* 3.2* 2.9*   No results for input(s): LIPASE, AMYLASE in the last 168 hours. No results for input(s): AMMONIA in the last 168 hours. Coagulation Profile: No results for input(s): INR, PROTIME in the last 168 hours. Cardiac Enzymes: Recent Labs  Lab 01/06/17 1607 01/06/17 2109 01/07/17 0630 01/07/17 1321  TROPONINI 0.06* 0.06* 0.07* 0.06*   BNP (last 3 results) No results for input(s): PROBNP in the last 8760 hours. HbA1C: No results for input(s): HGBA1C in the last 72 hours. CBG: Recent Labs  Lab 01/11/17 1144 01/11/17 1628 01/11/17 2320 01/12/17 0802 01/12/17 1150  GLUCAP 218* 243* 181* 162* 136*   Lipid Profile: No results for input(s): CHOL, HDL, LDLCALC, TRIG, CHOLHDL, LDLDIRECT in the last 72 hours. Thyroid Function Tests: No results for input(s): TSH, T4TOTAL, FREET4, T3FREE, THYROIDAB in the last 72 hours. Anemia Panel: No results for input(s): VITAMINB12, FOLATE, FERRITIN, TIBC, IRON, RETICCTPCT in the last 72 hours. Urine analysis:    Component Value Date/Time   COLORURINE YELLOW 01/10/2017 1150   APPEARANCEUR CLOUDY (A) 01/10/2017 1150   LABSPEC 1.011 01/10/2017 1150   PHURINE 7.0 01/10/2017 1150   GLUCOSEU NEGATIVE 01/10/2017 1150   HGBUR NEGATIVE 01/10/2017 1150   BILIRUBINUR NEGATIVE 01/10/2017 1150   KETONESUR NEGATIVE 01/10/2017 1150   PROTEINUR 100 (A) 01/10/2017 1150   UROBILINOGEN 0.2 04/25/2014 2315   NITRITE NEGATIVE 01/10/2017 1150   LEUKOCYTESUR MODERATE (A) 01/10/2017 1150   Sepsis  Labs: @LABRCNTIP (procalcitonin:4,lacticidven:4) )No results found for this or any previous visit (from the past 240 hour(s)).       Radiology Studies: No results found.    Scheduled Meds: . aspirin EC  81 mg Oral Daily  . atorvastatin  40 mg Oral q1800  . budesonide (PULMICORT) nebulizer solution  0.5 mg Nebulization BID  . calcitRIOL  0.25 mcg Oral Daily  . calcium acetate  667 mg Oral TID WC  . enoxaparin (LOVENOX) injection  30 mg Subcutaneous Q24H  . hydrALAZINE  50 mg Oral TID  . insulin aspart  0-15 Units Subcutaneous TID WC  . insulin aspart  0-5 Units Subcutaneous QHS  . ipratropium-albuterol  3 mL Nebulization Q6H  . loratadine  10 mg Oral Daily  . mouth rinse  15 mL Mouth Rinse BID  . metoprolol succinate  100 mg Oral Daily  . predniSONE  50 mg Oral Q breakfast  . sodium bicarbonate  650 mg  Oral TID  . sodium chloride flush  3 mL Intravenous Q12H  . tuberculin  5 Units Intradermal Once  . venlafaxine XR  150 mg Oral Q breakfast   Continuous Infusions: . sodium chloride    . sodium chloride    . sodium chloride    . sodium chloride    . sodium chloride    . sodium chloride    . furosemide Stopped (01/12/17 0923)     LOS: 6 days    Time spent in minutes: 35    Debbe Odea, MD [facility] Pager: [email or web address] Password [email or web address] 01/12/2017, 4:12 PM

## 2017-01-12 NOTE — Progress Notes (Signed)
Physical Therapy Note  Patient Details  Name: Kelli [name] MRN: [id number] Date of Birth: [birth year] Today's Date: 01/12/2017    Pt was receiving dialysis and unable to participate with therapy  Teena Irani, PTA/CLT [place], Amy B 01/12/2017, 2:47 PM

## 2017-01-12 NOTE — Progress Notes (Signed)
Kelli [name]  MRN: [id number]  DOB/AGE: 1951-01-04 66 y.o.  Primary Care Physician:[name], [name], MD  Admit date: 01/06/2017  Chief Complaint:  Chief Complaint  Patient presents with  . Shortness of Breath  . Dysuria  . Chest Pain    S-Pt presented on  01/06/2017 with  Chief Complaint  Patient presents with  . Shortness of Breath  . Dysuria  . Chest Pain  .    Pt offers no new complaints but pt sister says " she is better than yesterday"     Meds . aspirin EC  81 mg Oral Daily  . atorvastatin  40 mg Oral q1800  . budesonide (PULMICORT) nebulizer solution  0.5 mg Nebulization BID  . calcitRIOL  0.25 mcg Oral Daily  . calcium acetate  667 mg Oral TID WC  . enoxaparin (LOVENOX) injection  30 mg Subcutaneous Q24H  . hydrALAZINE  50 mg Oral TID  . insulin aspart  0-15 Units Subcutaneous TID WC  . insulin aspart  0-5 Units Subcutaneous QHS  . ipratropium-albuterol  3 mL Nebulization Q6H  . loratadine  10 mg Oral Daily  . mouth rinse  15 mL Mouth Rinse BID  . metoprolol succinate  100 mg Oral Daily  . predniSONE  50 mg Oral Q breakfast  . sodium bicarbonate  650 mg Oral TID  . sodium chloride flush  3 mL Intravenous Q12H  . venlafaxine XR  150 mg Oral Q breakfast      Physical Exam: Vital signs in last 24 hours: Temp:  [97.3 F (36.3 C)-97.8 F (36.6 C)] 97.5 F (36.4 C) (11/14 0328) Pulse Rate:  [73-95] 95 (11/14 0328) Resp:  [16-20] 18 (11/14 0328) BP: (105-194)/(52-77) 194/70 (11/14 0328) SpO2:  [94 %-98 %] 98 % (11/14 0758) Weight:  [191 lb 4.8 oz (86.8 kg)-196 lb 10.4 oz (89.2 kg)] 191 lb 4.8 oz (86.8 kg) (11/14 0328) Weight change: 3 lb 4.6 oz (1.49 kg) Last BM Date: 01/11/17  Intake/Output from previous day: 11/13 0701 - 11/14 0700 In: 365 [P.O.:365] Out: 2150 [Urine:1150] Total I/O In: -  Out: 300 [Urine:300]   Physical Exam: General- pt is awake,alert, oriented to time place and person Resp- No acute REsp distress, Rhonchi+ CVS- S1S2 regular  in rate and rhythm GIT- BS+, soft, NT, ND EXT- 1+ LE Edema, no Cyanosis Access-femoral shiley   Lab Results: Hgb  7.6=> 10.2    BMET Recent Labs    01/11/17 0615 01/12/17 0618  NA 132* 134*  K 6.1* 4.4  CL 100* 99*  CO2 18* 22  GLUCOSE 209* 206*  BUN 132* 108*  CREATININE 6.34* 5.21*  CALCIUM 7.2* 7.6*   Creat trend 2018   5.0=>5.6=>5.86   In August admision4.5--5.0 2016   1.4--1.5 2015   1.3--1.7 2014   1.1--2.0 ( AKi)    Lab Results  Component Value Date   PTH 162 (H) 10/14/2016   PTH Comment 10/14/2016   CALCIUM 7.6 (L) 01/12/2017   CAION 1.14 04/08/2013   PHOS 6.5 (H) 01/12/2017     ANA Positive Anti dsDNA negative Complements normal ANCA negative M spike negative Free kappa/lamda chain ratio- 2.08  Alb  3.2 Corrected calcium  7.6+ 0.6=8.2  Impression: 1)Renal                 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  3)Anemia HGb not at goal (9--11) Received prbc. On Epo as outpt  4)CKD Mineral-Bone Disorder PTH elevated. Secondary Hyperparathyroidism- present.    On calcitriol Phosphorus not at  goal. Calcium not at goal  5)DM PMD following  6)Electrolytes  Hyperkalemic     Now better  Hyponatremic   7)Acid base Co2 now at goal On po bicarb    Plan:  Will dialyze again today Will use 3k bath today   Alexx Mcburney S 01/12/2017, 9:15 AM

## 2017-01-13 DIAGNOSIS — N186 End stage renal disease: Secondary | ICD-10-CM

## 2017-01-13 DIAGNOSIS — E1169 Type 2 diabetes mellitus with other specified complication: Secondary | ICD-10-CM

## 2017-01-13 DIAGNOSIS — E669 Obesity, unspecified: Secondary | ICD-10-CM

## 2017-01-13 DIAGNOSIS — Z992 Dependence on renal dialysis: Secondary | ICD-10-CM

## 2017-01-13 LAB — BASIC METABOLIC PANEL
ANION GAP: 12 (ref 5–15)
BUN: 69 mg/dL — ABNORMAL HIGH (ref 6–20)
CO2: 25 mmol/L (ref 22–32)
Calcium: 7.2 mg/dL — ABNORMAL LOW (ref 8.9–10.3)
Chloride: 97 mmol/L — ABNORMAL LOW (ref 101–111)
Creatinine, Ser: 3.94 mg/dL — ABNORMAL HIGH (ref 0.44–1.00)
GFR calc Af Amer: 13 mL/min — ABNORMAL LOW (ref >60)
GFR calc non Af Amer: 11 mL/min — ABNORMAL LOW (ref >60)
GLUCOSE: 137 mg/dL — AB (ref 65–99)
POTASSIUM: 4 mmol/L (ref 3.5–5.1)
Sodium: 134 mmol/L — ABNORMAL LOW (ref 135–145)

## 2017-01-13 LAB — PROTIME-INR
INR: 1.05
PROTHROMBIN TIME: 13.6 s (ref 11.4–15.2)

## 2017-01-13 LAB — GLUCOSE, CAPILLARY
GLUCOSE-CAPILLARY: 272 mg/dL — AB (ref 65–99)
Glucose-Capillary: 156 mg/dL — ABNORMAL HIGH (ref 65–99)
Glucose-Capillary: 209 mg/dL — ABNORMAL HIGH (ref 65–99)
Glucose-Capillary: 198 mg/dL — ABNORMAL HIGH (ref 65–99)

## 2017-01-13 LAB — APTT: APTT: 25 s (ref 24–36)

## 2017-01-13 MED ORDER — ALBUTEROL SULFATE (2.5 MG/3ML) 0.083% IN NEBU: 2.5000 mg | INHALATION_SOLUTION | Freq: Four times a day (QID) | RESPIRATORY_TRACT | Status: DC | PRN

## 2017-01-13 MED ORDER — IPRATROPIUM-ALBUTEROL 0.5-2.5 (3) MG/3ML IN SOLN
3.0000 mL | Freq: Three times a day (TID) | RESPIRATORY_TRACT | Status: DC
  Administered 2017-01-14 – 2017-01-16 (×6): 3 mL via RESPIRATORY_TRACT
  Filled 2017-01-13 (×7): qty 3

## 2017-01-13 MED ORDER — PREDNISONE 10 MG PO TABS: 10.0000 mg | ORAL_TABLET | Freq: Every day | ORAL | Status: DC

## 2017-01-13 MED ORDER — ENOXAPARIN SODIUM 30 MG/0.3ML ~~LOC~~ SOLN
30.0000 mg | SUBCUTANEOUS | Status: DC
  Administered 2017-01-15 – 2017-01-18 (×4): 30 mg via SUBCUTANEOUS
  Filled 2017-01-13 (×4): qty 0.3

## 2017-01-13 NOTE — Consult Note (Signed)
Chief Complaint: Patient was seen in consultation today for tunneled Hemodialysis catheter placement Chief Complaint  Patient presents with  . Shortness of Breath  . Dysuria  . Chest Pain   at the request of Dr Reggy Eye Dr Francene Finders  Supervising Physician: Markus Daft  Patient Status: [facility] IP  History of Present Illness: Kelli [name] is a 66 y.o. female   Hx CVA; residual left weakness Generalized weakness N/V SOB for few days Lower extr swelling Acute on chronic renal failure hyperkalemia Has had 2 dialysis sessions at [facility]---feels some better Has temp cath in place Lt leg (Dr Constance Haw 01/11/17)  Dr Lowanda Foster ordering tunneled catheter placement Plan for same [facility] 11/16  Past Medical History:  Diagnosis Date  . Carotid artery occlusion    Occluded RICA, status post left CEA  August 2014 - Dr. Donnetta Hutching  . Cerebral infarction [facility]) Aug 2014   Bihemispheric watershed infarcts  . Cerebral infarction involving left cerebellar artery [facility]) Feb 2015  . CKD (chronic kidney disease) stage 3, GFR 30-59 ml/min (HCC)   . Closed dislocation of left humerus 07/26/2013  . DM (diabetes mellitus), type 2 ([place])   . Essential hypertension, benign   . Fibromyalgia   . Mixed hyperlipidemia   . Multiple gastric ulcers   . Urinary incontinence     Past Surgical History:  Procedure Laterality Date  . COMBINED HYSTERECTOMY VAGINAL W/ MMK / A&P REPAIR  1981  . ENDARTERECTOMY Left 10/06/2012   Procedure: Carotid Endarterectomy with Finesse patch angioplasty;  Surgeon: [name], MD;  Location: [facility];  Service: Vascular;  Laterality: Left;  . ESOPHAGOGASTRODUODENOSCOPY N/A 04/26/2014   Procedure: ESOPHAGOGASTRODUODENOSCOPY (EGD);  Surgeon: Lear Ng, MD;  Location: [facility] ENDOSCOPY;  Service: Endoscopy;  Laterality: N/A;  . LOOP RECORDER IMPLANT  04/16/13   MDT LinQ implanted for cryptogenic stroke  . [name] WITHOUT CARDIOVERSION N/A 04/16/2013   Procedure: TRANSESOPHAGEAL  ECHOCARDIOGRAM ([name]);  Surgeon: Josue Hector, MD;  Location: [facility] ENDOSCOPY;  Service: Cardiovascular;  Laterality: N/A;  . URETHRAL DILATION  1980's  . VAGINAL HYSTERECTOMY  1981   "partial" (10/04/2012)    Allergies: Hydrochlorothiazide  Medications: Prior to Admission medications   Medication Sig Start Date End Date Taking? Authorizing Provider  amLODipine (NORVASC) 10 MG tablet Take 10 mg by mouth daily.   Yes [provider]  atorvastatin (LIPITOR) 40 MG tablet Take 1 tablet (40 mg total) by mouth daily at 6 PM. 10/20/16  Yes McClung, Kimberlee Nearing, MD  calcitRIOL (ROCALTROL) 0.25 MCG capsule Take 1 capsule (0.25 mcg total) by mouth daily. 10/20/16  Yes Cherene Altes, MD  hydrALAZINE (APRESOLINE) 50 MG tablet Take 1 tablet (50 mg total) by mouth 3 (three) times daily. 10/20/16  Yes Cherene Altes, MD  nitrofurantoin (MACRODANTIN) 100 MG capsule Take 100 mg 2 (two) times daily by mouth.   Yes [provider]  potassium chloride SA (K-DUR,KLOR-CON) 20 MEQ tablet Take 1 tablet (20 mEq total) by mouth daily. Patient taking differently: Take 40 mEq daily by mouth.  10/20/16  Yes Cherene Altes, MD  sodium bicarbonate 650 MG tablet Take 1 tablet (650 mg total) by mouth 3 (three) times daily. 10/20/16  Yes Cherene Altes, MD  torsemide (DEMADEX) 20 MG tablet Take 2 tablets (40 mg total) by mouth daily. 10/20/16  Yes Cherene Altes, MD  venlafaxine XR (EFFEXOR-XR) 150 MG 24 hr capsule Take 150 mg by mouth daily with breakfast.  Yes [provider]  metoprolol succinate (TOPROL-XL) 100 MG 24 hr tablet Take 1 tablet (100 mg total) by mouth daily. Take with or immediately following a meal. Patient not taking: Reported on 01/06/2017 10/20/16   Cherene Altes, MD     Family History  Problem Relation Age of Onset  . Diabetes Mother   . Diabetes Father   . Hypertension Sister   . Diabetes Brother   . Seizures Son   . Kidney disease Maternal Grandmother     . Hypertension Maternal Grandmother   . Heart disease Maternal Grandfather   . Diabetes Paternal Grandmother   . Heart disease Paternal Grandfather     Social History   Socioeconomic History  . Marital status: Married    Spouse name: None  . Number of children: None  . Years of education: None  . Highest education level: None  Social Needs  . Financial resource strain: None  . Food insecurity - worry: None  . Food insecurity - inability: None  . Transportation needs - medical: None  . Transportation needs - non-medical: None  Occupational History  . None  Tobacco Use  . Smoking status: Former Smoker    Packs/day: 1.00    Years: 44.00    Pack years: 44.00    Types: Cigarettes    Last attempt to quit: 10/03/2012    Years since quitting: 4.2  . Smokeless tobacco: Never Used  Substance and Sexual Activity  . Alcohol use: No    Alcohol/week: 0.0 oz  . Drug use: No  . Sexual activity: Yes    Birth control/protection: Surgical  Other Topics Concern  . None  Social History Narrative  . None    Review of Systems: A 12 point ROS discussed and pertinent positives are indicated in the HPI above.  All other systems are negative.  Review of Systems  Constitutional: Positive for activity change, appetite change and fatigue. Negative for fever.  Respiratory: Positive for shortness of breath.   Cardiovascular: Positive for leg swelling.  Gastrointestinal: Positive for abdominal pain.  Musculoskeletal: Positive for gait problem.  Neurological: Positive for weakness.  Psychiatric/Behavioral: Negative for behavioral problems.    Vital Signs: BP 127/62 (BP Location: Left Arm)   Pulse 79   Temp 98.6 F (37 C) (Oral)   Resp 18   Ht 5\' 5"  (1.651 m)   Wt 189 lb 1.6 oz (85.8 kg)   SpO2 95%   BMI 31.47 kg/m   Physical Exam  Constitutional: She is oriented to person, place, and time.  ILL appearing   Cardiovascular: Normal rate.  Pulmonary/Chest: Effort normal. She has  wheezes.  Abdominal: Soft. Bowel sounds are normal.  Musculoskeletal:       Right lower leg: She exhibits edema.       Left lower leg: She exhibits edema.  Neurological: She is alert and oriented to person, place, and time.  Skin: Skin is warm and dry.  Psychiatric:  Consented with husband at bedside  Nursing note and vitals reviewed.   Imaging: Ct Abdomen Pelvis Wo Contrast  Result Date: 01/07/2017 CLINICAL DATA:  Abdominal pain. Decreased hemoglobin. Clinical suspicion for retroperitoneal hemorrhage. EXAM: CT ABDOMEN AND PELVIS WITHOUT CONTRAST TECHNIQUE: Multidetector CT imaging of the abdomen and pelvis was performed following the standard protocol without IV contrast. COMPARISON:  None. FINDINGS: Lower chest: Bibasilar heterogeneous airspace disease and small bilateral pleural effusions. Hepatobiliary: No mass visualized on this unenhanced exam. Gallbladder is unremarkable. Pancreas: No mass or inflammatory  process visualized on this unenhanced exam. Spleen:  Within normal limits in size. Adrenals/Urinary tract: Bilateral renal parenchymal atrophy, left side greater than right, consistent with history of chronic kidney disease. Renal vascular calcification noted. Several tiny calculi are seen within the left renal collecting system and proximal left ureter, without hydronephrosis. Unremarkable unopacified urinary bladder. Stomach/Bowel: No evidence of obstruction, inflammatory process, or abnormal fluid collections. Normal appendix visualized. Mild left-sided colonic diverticulosis seen, without evidence of diverticulitis. Vascular/Lymphatic: No pathologically enlarged lymph nodes identified. Infrarenal abdominal aortic aneurysm measuring 3.4 x 3.3 cm. Reproductive: Prior hysterectomy noted. Adnexal regions are unremarkable in appearance. Other: No evidence of retroperitoneal hemorrhage or hemoperitoneum. Mild edema in subcutaneous tissues. Musculoskeletal:  No suspicious bone lesions identified.  IMPRESSION: No evidence of retroperitoneal hemorrhage or hemoperitoneum. Several tiny calculi in left renal collecting system and proximal left ureter, without hydronephrosis. Bilateral renal parenchymal atrophy, left side greater than right. Colonic diverticulosis, without radiographic evidence of diverticulitis. Small bilateral pleural effusions and bilateral lower lobe airspace disease. 3.4 cm infrarenal abdominal aortic aneurysm. Recommend followup by ultrasound in 3 years. This recommendation follows ACR consensus guidelines: White Paper of the ACR Incidental Findings Committee II on Vascular Findings. Natasha Mead Coll Radiol 2013; 10:789-794 Electronically Signed   By: Earle Gell M.D.   On: 01/07/2017 10:22   Dg Chest 2 View  Result Date: 01/06/2017 CLINICAL DATA:  Trouble breathing x1 day with tightness and congestion in chest. Weakness. Hx of diabetes, HTN-controlled with medication, and low hemoglobin. 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 EXAM: CHEST  2 VIEW COMPARISON:  10/19/2016 FINDINGS: The heart is enlarged. There is pulmonary vascular congestion. Prominent interstitial markings are consistent with mild edema. Trace bilateral pleural effusions are also suspected. There is mild bibasilar atelectasis. IMPRESSION: Cardiomegaly and mild pulmonary edema. Electronically Signed   By: Nolon Nations M.D.   On: 01/06/2017 16:56   Dg Chest Port 1 View  Result Date: 01/09/2017 CLINICAL DATA:  CHF EXAM: PORTABLE CHEST 1 VIEW COMPARISON:  01/07/2017 FINDINGS: Severe bilateral airspace disease similar to the prior study. Probable congestive heart failure with edema. Pneumonia not excluded. Bibasilar atelectasis and small left effusion. Right jugular central venous catheter tip in the SVC unchanged IMPRESSION: Diffuse bilateral airspace disease unchanged. Probable heart failure. Electronically Signed   By: Franchot Gallo M.D.   On: 01/09/2017 11:27   Dg Chest Port 1 View  Result Date:  01/07/2017 CLINICAL DATA:  66 year old female central line placement. EXAM: PORTABLE CHEST 1 VIEW COMPARISON:  1016 hours today and earlier. FINDINGS: Portable AP semi upright view at 1432 hours. New right IJ approach central line is in place, tip projects at the level of the right mainstem bronchus corresponding to the lower SVC. No pneumothorax. Stable cardiac size and mediastinal contours. Stable bilateral ventilation since this morning including dense retrocardiac opacity which has progressed since 01/06/2017. Visualized tracheal air column is within normal limits. IMPRESSION: 1. Right IJ central line tip at the lower SVC level. No pneumothorax or adverse features. 2. Stable ventilation since 1016 hours today. Electronically Signed   By: Genevie Ann M.D.   On: 01/07/2017 15:02   Dg Chest Port 1 View  Result Date: 01/07/2017 CLINICAL DATA:  Respiratory distress. EXAM: PORTABLE CHEST 1 VIEW COMPARISON:  01/06/2017 FINDINGS: Lungs are adequately inflated with worsening patchy multifocal airspace opacification most notable over the left midlung and right upper lobe likely a multifocal infectious process. Possible small amount left pleural fluid. Mild stable cardiomegaly. Remainder of the  exam is unchanged. IMPRESSION: Slight worsening bilateral multifocal airspace process most notable over the left midlung and right upper lobe suggesting multifocal infection. Stable cardiomegaly. Electronically Signed   By: Marin Olp M.D.   On: 01/07/2017 10:37    Labs:  CBC: Recent Labs    01/06/17 1607 01/07/17 0630 01/11/17 0615 01/11/17 1340 01/12/17 1243  WBC 11.6*  --  15.5* 15.2* 20.4*  HGB 7.6* 10.1* 10.6* 10.2* 10.9*  HCT 22.3* 30.1* 31.8* 30.8* 32.9*  PLT 304  --  410* 411* 357    COAGS: No results for input(s): INR, APTT in the last 8760 hours.  BMP: Recent Labs    01/10/17 0535 01/11/17 0615 01/12/17 0618 01/13/17 0551  NA 131* 132* 134* 134*  K 5.0 6.1* 4.4 4.0  CL 99* 100* 99* 97*    CO2 17* 18* 22 25  GLUCOSE 205* 209* 206* 137*  BUN 112* 132* 108* 69*  CALCIUM 6.8* 7.2* 7.6* 7.2*  CREATININE 5.86* 6.34* 5.21* 3.94*  GFRNONAA 7* 6* 8* 11*  GFRAA 8* 7* 9* 13*    LIVER FUNCTION TESTS: Recent Labs    10/13/16 0352  10/19/16 1105 01/10/17 0535 01/11/17 0615 01/12/17 0618  BILITOT 0.4  --  0.4  --   --   --   AST 13*  --  14*  --   --   --   ALT 11*  --  14  --   --   --   ALKPHOS 68  --  59  --   --   --   PROT 6.9  --  6.6  --   --   --   ALBUMIN 3.3*   < > 3.0*  3.0* 3.2* 3.2* 2.9*   < > = values in this interval not displayed.    TUMOR MARKERS: No results for input(s): AFPTM, CEA, CA199, CHROMGRNA in the last 8760 hours.  Assessment and Plan:  Acute on chronic renal failure Worsening Creatinine Temp dialysis catheter placed 11/13-- left fem vein 2 dialysis sessions at [facility] Now scheduled for tunneled dialysis catheter placement in IR [facility] 11/16 Risks and benefits discussed with the patient including, but not limited to bleeding, infection, vascular injury, pneumothorax which may require chest tube placement, air embolism or even death All of the patient's questions were answered, patient is agreeable to proceed. Consent signed and in chart.   Thank you for this interesting consult.  I greatly enjoyed meeting Kelli [name] and look forward to participating in their care.  A copy of this report was sent to the requesting provider on this date.  Electronically Signed: Lavonia Drafts, PA-C 01/13/2017, 10:58 AM   I spent a total of 20 Minutes    in face to face in clinical consultation, greater than 50% of which was counseling/coordinating care for tunneled dialysis catheter placement

## 2017-01-13 NOTE — Progress Notes (Signed)
Daily Progress Note   Patient Name: Kelli [name]       Date: 01/13/2017 DOB: 06-18-1950  Age: 66 y.o. MRN#: [id number] Attending Physician: Debbe Odea, MD Primary Care Physician: Manon Hilding, MD Admit Date: 01/06/2017  Reason for Consultation/Follow-up: Establishing goals of care and Psychosocial/spiritual support  Subjective: Kelli [name] is resting quietly in bed.  Her husband is at bedside.  She greets me making but not keeping eye contact.  Present today at bedside his husband, Stephany Poorman.    I asked how she is doing, and she states "tired".  I share that she has a lot going on, and that this is normal.  I encouraged her to "give it more time".  I shared that she is expected to have the tunneled HD cath tomorrow, and also is scheduled for 4 hours of hemodialysis.  We talked about the logistics of having that tunneled HD cath in [facility] and a 4-hour hemodialysis session.  Kelli [name] tells me that she is thirsty, I get her some Sprite Zero, which she is able to take without signs and symptoms of aspiration.  She seems more subdued today.  I encouraged her to do the best she can, stating that I will follow-up with her tomorrow.  Length of Stay: 7  Current Medications: Scheduled Meds:  . aspirin EC  81 mg Oral Daily  . atorvastatin  40 mg Oral q1800  . budesonide (PULMICORT) nebulizer solution  0.5 mg Nebulization BID  . calcitRIOL  0.25 mcg Oral Daily  . calcium acetate  667 mg Oral TID WC  . [START ON 01/15/2017] enoxaparin (LOVENOX) injection  30 mg Subcutaneous Q24H  . hydrALAZINE  50 mg Oral TID  . insulin aspart  0-15 Units Subcutaneous TID WC  . insulin aspart  0-5 Units Subcutaneous QHS  . ipratropium-albuterol  3 mL Nebulization Q6H  . loratadine  10 mg Oral Daily  . mouth  rinse  15 mL Mouth Rinse BID  . metoprolol succinate  100 mg Oral Daily  . [START ON 01/14/2017] predniSONE  10 mg Oral Q breakfast  . sodium chloride flush  3 mL Intravenous Q12H  . tuberculin  5 Units Intradermal Once  . venlafaxine XR  150 mg Oral Q breakfast    Continuous Infusions: . sodium chloride    .  sodium chloride    . sodium chloride    . sodium chloride    . sodium chloride    . sodium chloride    . furosemide Stopped (01/13/17 0909)    PRN Meds: sodium chloride, sodium chloride, sodium chloride, sodium chloride, sodium chloride, acetaminophen, alteplase, alteplase, alum & mag hydroxide-simeth, heparin, heparin, lidocaine (PF), lidocaine (PF), lidocaine-prilocaine, lidocaine-prilocaine, ondansetron (ZOFRAN) IV, pentafluoroprop-tetrafluoroeth, pentafluoroprop-tetrafluoroeth, sodium chloride flush  Physical Exam  Constitutional: She appears ill.  HENT:  Head: Atraumatic.  Cardiovascular: Normal rate.  Pulmonary/Chest: Effort normal.  Abdominal: Soft. She exhibits no distension.  Neurological: She is alert.  Skin: Skin is warm and dry.  Nursing note and vitals reviewed.           Vital Signs: BP (!) 143/73 (BP Location: Left Arm)   Pulse 83   Temp 98.7 F (37.1 C) (Oral)   Resp 18   Ht 5\' 5"  (1.651 m)   Wt 85.8 kg (189 lb 1.6 oz)   SpO2 98%   BMI 31.47 kg/m  SpO2: SpO2: 98 % O2 Device: O2 Device: Nasal Cannula O2 Flow Rate: O2 Flow Rate (L/min): 3 L/min  Intake/output summary:   Intake/Output Summary (Last 24 hours) at 01/13/2017 1507 Last data filed at 01/13/2017 1601 Gross per 24 hour  Intake 364 ml  Output 750 ml  Net -386 ml   LBM: Last BM Date: 01/11/17 Baseline Weight: Weight: 83.5 kg (184 lb) Most recent weight: Weight: 85.8 kg (189 lb 1.6 oz)       Palliative Assessment/Data:    Flowsheet Rows     Most Recent Value  Intake Tab  Referral Department  Hospitalist  Unit at Time of Referral  Cardiac/Telemetry Unit  Palliative Care  Primary Diagnosis  Nephrology  Date Notified  01/09/17  Palliative Care Type  New Palliative care  Reason for referral  Clarify Goals of Care  Date of Admission  01/06/17  Date first seen by Palliative Care  01/10/17  # of days Palliative referral response time  1 Day(s)  # of days IP prior to Palliative referral  3  Clinical Assessment  Palliative Performance Scale Score  30%  Pain Max last 24 hours  Not able to report  Pain Min Last 24 hours  Not able to report  Dyspnea Max Last 24 Hours  Not able to report  Dyspnea Min Last 24 hours  Not able to report  Psychosocial & Spiritual Assessment  Palliative Care Outcomes  Patient/Family meeting held?  Yes  Who was at the meeting?  Patient and Sister Alphonse Guild at bedside  Okay goals of care, Provided psychosocial or spiritual support      Patient Active Problem List   Diagnosis Date Noted  . Palliative care encounter   . Goals of care, counseling/discussion   . DNR (do not resuscitate) discussion   . Acute renal failure superimposed on stage 4 chronic kidney disease ([place]) 01/09/2017  . Precordial pain   . CKD (chronic kidney disease), stage IV ([place]) 01/07/2017  . Acute respiratory failure with hypoxia ([place]) 01/07/2017  . Symptomatic anemia 01/06/2017  . Diabetes mellitus type 2 in obese ([place]) 01/06/2017  . B12 deficiency 11/18/2016  . Anemia in chronic kidney disease 11/18/2016  . PUD (peptic ulcer disease) 10/13/2016  . Pacemaker 10/13/2016  . Acute on chronic diastolic CHF (congestive heart failure) ([place]) 10/13/2016  . Elevated troponin   . Hypovolemic shock ([place]) 04/26/2014  . Upper GI bleed   .  Leukocytosis, unspecified 07/30/2013  . Thrombocytosis ([place]) 07/30/2013  . Subluxation of left shoulder joint 07/27/2013  . Closed dislocation of left humerus 07/26/2013  . Pyelonephritis 07/25/2013  . Acute on chronic renal failure ([place]) 07/25/2013  . Normocytic anemia 07/25/2013  . Hyponatremia  07/25/2013  . Spastic hemiplegia affecting nondominant side ([place]) 07/06/2013  . 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 UTI (urinary tract infection) 06/15/2013  . Edema 05/28/2013  . Knee pain 05/28/2013  . Carotid stenosis, bilateral 05/22/2013  . Fall at nursing home 05/20/2013  . Hip pain 05/20/2013  . Pain in joint, lower leg 05/19/2013  . Chest congestion 05/12/2013  . E. coli UTI 05/07/2013  . Left shoulder pain due to subluxation and/or adhesive capsulitis.  05/07/2013  . Acute CVA (cerebrovascular accident): R PCA infarct per MRI 04/13/13 04/13/2013  . CVA (cerebral infarction) 04/08/2013  . Acute ischemic stroke ([place]) 04/08/2013  . Diplopia 04/08/2013  . Left-sided weakness 04/08/2013  . Vertigo 04/08/2013  . Paresthesias in left hand 04/08/2013  . Cerebellar stroke ([place]) 04/08/2013  . Dizziness and giddiness 04/08/2013  . Stroke ([place]) 04/08/2013  . Depression 04/06/2013  . Peripheral arterial disease ([place]) 03/16/2013  . History of stroke 01/30/2013  . Anxiety state, unspecified 12/08/2012  . Lumbago 12/08/2012  . Urinary frequency 11/14/2012  . Occlusion and stenosis of carotid artery with cerebral infarction 10/05/2012  . Mixed hyperlipidemia 10/05/2012  . Renal insufficiency 10/05/2012  . HTN (hypertension) 10/04/2012  . Tobacco abuse 10/04/2012  . Obesity 10/04/2012    Palliative Care Assessment & Plan   Patient Profile: 67 y.o.femalewith past medical history of carotid artery occlusion, left MCA cerebral artery infarct with residual hemiparesis, fibromyalgia, high blood pressure and cholesterol, multiple gastric ulcers, urinary incontinence, stage III kidney disease, essentially bedboundadmitted on 11/8/2018with symptomatic anemia, heart failure, worsening kidney disease.   Assessment: heart failure, worsening kidney disease: Kelli [name] is scheduled for emergency hemodialysis catheter placement today with  emergent dialysis of 2 hours 11/13, she also had 2 hours dialysis 11/14, and is scheduled for 4-hour dialysis 11/15 along with her tunneled cath.    Recommendations/Plan:  Full scope treatment.   Emergent dialysis is today.  Continue CODE STATUS discussions.  Goals of Care and Additional Recommendations:  Limitations on Scope of Treatment: Full Scope Treatment  Code Status:    Code Status Orders  (From admission, onward)        Start     Ordered   01/06/17 2015  Full code  Continuous     01/06/17 2014    Code Status History    Date Active Date Inactive Code Status Order ID Comments User Context   10/13/2016 06:39 10/20/2016 22:33 Full Code [id number]  Phillips Grout, MD Inpatient   04/26/2014 02:32 04/30/2014 23:09 Full Code [id number]  Shanda Howells, MD ED   07/25/2013 12:18 07/31/2013 19:08 Full Code [id number]  Radene Gunning, NP Inpatient   04/17/2013 17:59 05/08/2013 14:03 Full Code [id number]  Bary Leriche, PA-C Inpatient   04/17/2013 17:59 04/17/2013 17:59 Full Code [id number]  Bary Leriche, PA-C Inpatient   04/16/2013 12:07 04/17/2013 17:59 Full Code [id number]  Evans Lance, MD Inpatient   04/08/2013 20:54 04/16/2013 12:07 Full Code [id number]  Bynum Bellows, MD ED   01/16/2013 23:35 01/18/2013 19:19 Full Code [id number]  Phillips Grout, MD Inpatient   10/09/2012 17:09 10/19/2012 14:27 Full Code [id number]  Elizabeth Sauer Inpatient   10/04/2012 01:28 10/09/2012 17:09  Full Code [id number]  Orvan Falconer, MD Inpatient       Prognosis:   Unable to determine, based on outcomes. 12 months or less would not be surprising based on current functional status, health history, frailty,2 hospitalizations in 6 months, new to hemodialysis  Discharge Planning:  Likely return to home with services, as Kelli [name] states she will not return to rehab/SNF.  Care plan was discussed with nursing staff, social worker, and Dr. Wynelle Cleveland.  Thank you for allowing the Palliative Medicine Team to  assist in the care of this patient.   Time In:  1435 Time Out:  1450 Total Time  15 minutes Prolonged Time Billed  no       Greater than 50%  of this time was spent counseling and coordinating care related to the above assessment and plan.  Drue Novel, NP  Please contact Palliative Medicine Team phone at [phone] for questions and concerns.

## 2017-01-13 NOTE — Care Management Note (Signed)
Case Management Note  Patient Details  Name: Kelli [name] MRN: [id number] Date of Birth: 22-Dec-1950  If discussed at Long Length of Stay Meetings, dates discussed:  01/13/2017  Additional Comments:  Sherald Barge, RN 01/13/2017, 12:05 PM

## 2017-01-13 NOTE — Progress Notes (Signed)
PROGRESS NOTE    Kelli [name]   HYW:[id number]  DOB: October 05, 1950  DOA: 01/06/2017 PCP: Manon Hilding, MD   Brief Narrative:  [name] 66 year old female with a history of stroke with residual left hemiparesis, CKD stage III, essential hypertension, diabetes mellitus, peripheral vascular disease presentedwith 1 day history of shortness of breath, worsening leg edema, and chest discomfort. The patient also complains of a cough with clear sputum. She had subjective fevers and chills, but denied any headache, neck pain.The patient has chronic nausea and vomiting each morning which has been unchanged.  She complains of intermittent abdominal pain with loose stools. There is no dysuria, hematuria.U In ER: Hb 7.6. Chest x-ray showed pulmonary edema with BNP 966. The patientwas started on IV Lasix but remained fluid overloaded and her renal function continued to worsen. Repeat chest x-ray revealed pulmonary edema. Nephrology was consulted to assist with management and possible initiation of dialysis.  Dr. Constance Haw placed VasCath on 01/11/17.   Subjective: Sleepy/ tired. No other complaints.      Assessment & Plan:   Principal Problem:   Acute on chronic renal failure/ fluid overload - now ESRD - appreciate management per nephrology- plans for permanent dialysis cath - declines to speak with palliative care  Active Problems:  acute hypoxic resp failure - fluid overload and COPD  COPD exacerbation - on Prednisone- will  Cont to wean- no wheezing -cont Nebs  Leukocytosis - due to steroids  Metabolic acidosis - on sodium Bicarb tabs- d/c'd after she was adequately dialyzed.     HTN (hypertension) - Hydralazine, Toprol    Mixed hyperlipidemia - Lipitor    Elevated troponin - due to fluid overload vs CKD flat trend    Symptomatic anemia - hemolysis labs negative - likely due to chronic disease - Hb 7.6 on 11/8- given 2 u PRBC on 11/8 and Hb since has been  stable    Diabetes mellitus type 2 in obese  - cont ISS   Hx of Stroke with left hemiparesis  -continue ASA - lipid panel--LDL57 - refuses SNF  Severe fatigue - declines SNF   DVT prophylaxis: Lovenox Code Status: Full code Family Communication: husband Disposition Plan: home when stable-  Consultants:   nephro  Palliative care Procedures:   HD cath Antimicrobials:  Anti-infectives (From admission, onward)   None       Objective: Vitals:   01/13/17 0600 01/13/17 0746 01/13/17 0749 01/13/17 1256  BP: 127/62   (!) 143/73  Pulse: 79   83  Resp: 18   18  Temp: 98.6 F (37 C)   98.7 F (37.1 C)  TempSrc: Oral   Oral  SpO2: 97% 95% 95% 98%  Weight: 85.8 kg (189 lb 1.6 oz)     Height:        Intake/Output Summary (Last 24 hours) at 01/13/2017 1402 Last data filed at 01/13/2017 0909 Gross per 24 hour  Intake 364 ml  Output 2968 ml  Net -2604 ml   Filed Weights   01/12/17 1100 01/12/17 1408 01/13/17 0600  Weight: 86.8 kg (191 lb 5.8 oz) 83.8 kg (184 lb 11.9 oz) 85.8 kg (189 lb 1.6 oz)    Examination: General exam: Appears comfortable - sleepy today HEENT: PERRLA, oral mucosa moist, no sclera icterus or thrush Respiratory system: Clear to auscultation. Respiratory effort normal. Cardiovascular system: S1 & S2 heard, RRR.  No murmurs  Gastrointestinal system: Abdomen soft, non-tender, nondistended. Normal bowel sound. No organomegaly Central nervous system:  Alert  No focal neurological deficits. Extremities: No cyanosis, clubbing or edema Skin: No rashes or ulcers Psychiatry:  Mood & affect appropriate.     Data Reviewed: I have personally reviewed following labs and imaging studies  CBC: Recent Labs  Lab 01/06/17 1607 01/07/17 0630 01/11/17 0615 01/11/17 1340 01/12/17 1243  WBC 11.6*  --  15.5* 15.2* 20.4*  HGB 7.6* 10.1* 10.6* 10.2* 10.9*  HCT 22.3* 30.1* 31.8* 30.8* 32.9*  MCV 86.8  --  86.4 86.8 86.1  PLT 304  --  410* 411* 446   Basic  Metabolic Panel: Recent Labs  Lab 01/09/17 0653 01/10/17 0535 01/11/17 0615 01/12/17 0618 01/13/17 0551  NA 134* 131* 132* 134* 134*  K 4.1 5.0 6.1* 4.4 4.0  CL 103 99* 100* 99* 97*  CO2 18* 17* 18* 22 25  GLUCOSE 194* 205* 209* 206* 137*  BUN 93* 112* 132* 108* 69*  CREATININE 5.59* 5.86* 6.34* 5.21* 3.94*  CALCIUM 7.1* 6.8* 7.2* 7.6* 7.2*  PHOS  --  6.9* 7.3* 6.5*  --    GFR: Estimated Creatinine Clearance: 15.2 mL/min (A) (by C-G formula based on SCr of 3.94 mg/dL (H)). Liver Function Tests: Recent Labs  Lab 01/10/17 0535 01/11/17 0615 01/12/17 0618  ALBUMIN 3.2* 3.2* 2.9*   No results for input(s): LIPASE, AMYLASE in the last 168 hours. No results for input(s): AMMONIA in the last 168 hours. Coagulation Profile: Recent Labs  Lab 01/13/17 1142  INR 1.05   Cardiac Enzymes: Recent Labs  Lab 01/06/17 1607 01/06/17 2109 01/07/17 0630 01/07/17 1321  TROPONINI 0.06* 0.06* 0.07* 0.06*   BNP (last 3 results) No results for input(s): PROBNP in the last 8760 hours. HbA1C: No results for input(s): HGBA1C in the last 72 hours. CBG: Recent Labs  Lab 01/12/17 1150 01/12/17 1635 01/12/17 2111 01/13/17 0742 01/13/17 1122  GLUCAP 136* 309* 171* 156* 272*   Lipid Profile: No results for input(s): CHOL, HDL, LDLCALC, TRIG, CHOLHDL, LDLDIRECT in the last 72 hours. Thyroid Function Tests: No results for input(s): TSH, T4TOTAL, FREET4, T3FREE, THYROIDAB in the last 72 hours. Anemia Panel: No results for input(s): VITAMINB12, FOLATE, FERRITIN, TIBC, IRON, RETICCTPCT in the last 72 hours. Urine analysis:    Component Value Date/Time   COLORURINE YELLOW 01/10/2017 1150   APPEARANCEUR CLOUDY (A) 01/10/2017 1150   LABSPEC 1.011 01/10/2017 1150   PHURINE 7.0 01/10/2017 1150   GLUCOSEU NEGATIVE 01/10/2017 1150   HGBUR NEGATIVE 01/10/2017 1150   BILIRUBINUR NEGATIVE 01/10/2017 1150   KETONESUR NEGATIVE 01/10/2017 1150   PROTEINUR 100 (A) 01/10/2017 1150    UROBILINOGEN 0.2 04/25/2014 2315   NITRITE NEGATIVE 01/10/2017 1150   LEUKOCYTESUR MODERATE (A) 01/10/2017 1150   Sepsis Labs: @LABRCNTIP (procalcitonin:4,lacticidven:4) )No results found for this or any previous visit (from the past 240 hour(s)).       Radiology Studies: No results found.    Scheduled Meds: . aspirin EC  81 mg Oral Daily  . atorvastatin  40 mg Oral q1800  . budesonide (PULMICORT) nebulizer solution  0.5 mg Nebulization BID  . calcitRIOL  0.25 mcg Oral Daily  . calcium acetate  667 mg Oral TID WC  . [START ON 01/15/2017] enoxaparin (LOVENOX) injection  30 mg Subcutaneous Q24H  . hydrALAZINE  50 mg Oral TID  . insulin aspart  0-15 Units Subcutaneous TID WC  . insulin aspart  0-5 Units Subcutaneous QHS  . ipratropium-albuterol  3 mL Nebulization Q6H  . loratadine  10 mg Oral Daily  . mouth  rinse  15 mL Mouth Rinse BID  . metoprolol succinate  100 mg Oral Daily  . predniSONE  30 mg Oral Q breakfast  . sodium chloride flush  3 mL Intravenous Q12H  . tuberculin  5 Units Intradermal Once  . venlafaxine XR  150 mg Oral Q breakfast   Continuous Infusions: . sodium chloride    . sodium chloride    . sodium chloride    . sodium chloride    . sodium chloride    . sodium chloride    . furosemide Stopped (01/13/17 0909)     LOS: 7 days    Time spent in minutes: 35    Debbe Odea, MD [facility] Pager: [email or web address] Password [email or web address] 01/13/2017, 2:02 PM

## 2017-01-13 NOTE — Progress Notes (Signed)
Subjective: Interval History: Patient still complains of weakness.  Her appetite however is improving and she did not have any nausea or vomiting.  Objective: Vital signs in last 24 hours: Temp:  [98.6 F (37 C)-98.8 F (37.1 C)] 98.6 F (37 C) (11/15 0600) Pulse Rate:  [63-85] 79 (11/15 0600) Resp:  [18] 18 (11/15 0600) BP: (102-152)/(51-76) 127/62 (11/15 0600) SpO2:  [95 %-99 %] 95 % (11/15 0749) Weight:  [83.8 kg (184 lb 11.9 oz)-86.8 kg (191 lb 5.8 oz)] 85.8 kg (189 lb 1.6 oz) (11/15 0600) Weight change: -2.4 kg (-4.7 oz)  Intake/Output from previous day: 11/14 0701 - 11/15 0700 In: 550 [P.O.:240; IV Piggyback:310] Out: 5364 [Urine:1050] Intake/Output this shift: No intake/output data recorded.  General appearance: alert, cooperative and no distress Resp: diminished breath sounds bilaterally Cardio: regular rate and rhythm Extremities: edema She has 1+ edema  Lab Results: Recent Labs    01/11/17 1340 01/12/17 1243  WBC 15.2* 20.4*  HGB 10.2* 10.9*  HCT 30.8* 32.9*  PLT 411* 357   BMET:  Recent Labs    01/12/17 0618 01/13/17 0551  NA 134* 134*  K 4.4 4.0  CL 99* 97*  CO2 22 25  GLUCOSE 206* 137*  BUN 108* 69*  CREATININE 5.21* 3.94*  CALCIUM 7.6* 7.2*   No results for input(s): PTH in the last 72 hours. Iron Studies: No results for input(s): IRON, TIBC, TRANSFERRIN, FERRITIN in the last 72 hours.  Studies/Results: No results found.  I have reviewed the patient's current medications.  Assessment/Plan: Problem #1 acute kidney injury superimposed on chronic: She is status post hemodialysis the last 2 days.  Patient is still complains of weakness however she seems to be feeling much better. 2] hyperkalemia: Her potassium has corrected. 3] history of CHF: We will able to remove about 2 L yesterday.  Patient also has about a liter of urine output.  Presently she denies any difficulty breathing. 4] bone and mineral disorder: Her calcium is in  range but   phosphorus is high.  Patient is on binder 5 5] anemia: Her hemoglobin is within her target goal 6] hypertension: Her blood pressure is reasonably controlled Plan:1] We will dialyze patient tomorrow for 4 hours 2] will remove 68/0 L if systolic blood pressure remains above 90 3] will make arrangement for patient to have permanent tunnel catheter for hemodialysis. 4] we will check her CBC and renal panel in the morning.   LOS: 7 days   [name] S 01/13/2017,9:46 AM

## 2017-01-14 ENCOUNTER — Encounter (HOSPITAL_COMMUNITY): Payer: Self-pay | Admitting: Interventional Radiology

## 2017-01-14 ENCOUNTER — Ambulatory Visit (HOSPITAL_COMMUNITY)
Admit: 2017-01-14 | Discharge: 2017-01-14 | Disposition: A | Discharge status: Home / Self Care | Payer: 59 | Attending: Nephrology | Admitting: Nephrology

## 2017-01-14 HISTORY — PX: IR US GUIDE VASC ACCESS LEFT: IMG2389

## 2017-01-14 HISTORY — PX: IR FLUORO GUIDE CV LINE LEFT: IMG2282

## 2017-01-14 LAB — RENAL FUNCTION PANEL
ALBUMIN: 2.5 g/dL — AB (ref 3.5–5.0)
ALBUMIN: 2.6 g/dL — AB (ref 3.5–5.0)
Anion gap: 11 (ref 5–15)
Anion gap: 11 (ref 5–15)
BUN: 82 mg/dL — ABNORMAL HIGH (ref 6–20)
BUN: 80 mg/dL — AB (ref 6–20)
CALCIUM: 6.9 mg/dL — AB (ref 8.9–10.3)
CHLORIDE: 96 mmol/L — AB (ref 101–111)
CO2: 26 mmol/L (ref 22–32)
CO2: 26 mmol/L (ref 22–32)
CREATININE: 4.33 mg/dL — AB (ref 0.44–1.00)
CREATININE: 4.38 mg/dL — AB (ref 0.44–1.00)
Calcium: 6.9 mg/dL — ABNORMAL LOW (ref 8.9–10.3)
Chloride: 96 mmol/L — ABNORMAL LOW (ref 101–111)
GFR calc non Af Amer: 10 mL/min — ABNORMAL LOW (ref >60)
GFR, EST AFRICAN AMERICAN: 11 mL/min — AB (ref >60)
GFR, EST AFRICAN AMERICAN: 11 mL/min — AB (ref >60)
GFR, EST NON AFRICAN AMERICAN: 10 mL/min — AB (ref >60)
GLUCOSE: 124 mg/dL — AB (ref 65–99)
Glucose, Bld: 124 mg/dL — ABNORMAL HIGH (ref 65–99)
PHOSPHORUS: 4.8 mg/dL — AB (ref 2.5–4.6)
PHOSPHORUS: 4.8 mg/dL — AB (ref 2.5–4.6)
POTASSIUM: 3.5 mmol/L (ref 3.5–5.1)
Potassium: 3.5 mmol/L (ref 3.5–5.1)
SODIUM: 133 mmol/L — AB (ref 135–145)
Sodium: 133 mmol/L — ABNORMAL LOW (ref 135–145)

## 2017-01-14 LAB — GLUCOSE, CAPILLARY
Glucose-Capillary: 129 mg/dL — ABNORMAL HIGH (ref 65–99)
Glucose-Capillary: 108 mg/dL — ABNORMAL HIGH (ref 65–99)
Glucose-Capillary: 131 mg/dL — ABNORMAL HIGH (ref 65–99)
Glucose-Capillary: 126 mg/dL — ABNORMAL HIGH (ref 65–99)
Glucose-Capillary: 130 mg/dL — ABNORMAL HIGH (ref 65–99)

## 2017-01-14 LAB — CBC
HCT: 27.9 % — ABNORMAL LOW (ref 36.0–46.0)
Hemoglobin: 9.2 g/dL — ABNORMAL LOW (ref 12.0–15.0)
MCH: 28.9 pg (ref 26.0–34.0)
MCHC: 33 g/dL (ref 30.0–36.0)
MCV: 87.7 fL (ref 78.0–100.0)
PLATELETS: 224 10*3/uL (ref 150–400)
RBC: 3.18 MIL/uL — ABNORMAL LOW (ref 3.87–5.11)
RDW: 14.5 % (ref 11.5–15.5)
WBC: 17.3 10*3/uL — ABNORMAL HIGH (ref 4.0–10.5)

## 2017-01-14 MED ORDER — FENTANYL CITRATE (PF) 100 MCG/2ML IJ SOLN
INTRAMUSCULAR | Status: AC | PRN
  Administered 2017-01-14: 50 ug via INTRAVENOUS

## 2017-01-14 MED ORDER — HEPARIN SODIUM (PORCINE) 1000 UNIT/ML IJ SOLN
INTRAMUSCULAR | Status: AC
  Filled 2017-01-14: qty 1

## 2017-01-14 MED ORDER — FENTANYL CITRATE (PF) 100 MCG/2ML IJ SOLN
INTRAMUSCULAR | Status: DC
Start: 2017-01-14 — End: 2017-01-15
  Filled 2017-01-14: qty 4

## 2017-01-14 MED ORDER — LIDOCAINE HCL 1 % IJ SOLN
INTRAMUSCULAR | Status: DC | PRN
  Administered 2017-01-14: 5 mL

## 2017-01-14 MED ORDER — MIDAZOLAM HCL 2 MG/2ML IJ SOLN
INTRAMUSCULAR | Status: AC
  Filled 2017-01-14: qty 4

## 2017-01-14 MED ORDER — LIDOCAINE HCL 1 % IJ SOLN
INTRAMUSCULAR | Status: AC
  Filled 2017-01-14: qty 20

## 2017-01-14 MED ORDER — METOPROLOL SUCCINATE ER 50 MG PO TB24
50.0000 mg | ORAL_TABLET | Freq: Every day | ORAL | Status: DC
  Administered 2017-01-15 – 2017-01-22 (×6): 50 mg via ORAL
  Filled 2017-01-14 (×6): qty 1

## 2017-01-14 MED ORDER — HEPARIN SODIUM (PORCINE) 1000 UNIT/ML IJ SOLN
INTRAMUSCULAR | Status: DC | PRN
  Administered 2017-01-14: 3.8 mL

## 2017-01-14 MED ORDER — CEFAZOLIN SODIUM-DEXTROSE 2-4 GM/100ML-% IV SOLN
INTRAVENOUS | Status: AC
  Filled 2017-01-14: qty 100

## 2017-01-14 MED ORDER — MIDAZOLAM HCL 2 MG/2ML IJ SOLN
INTRAMUSCULAR | Status: AC | PRN
  Administered 2017-01-14 (×2): 1 mg via INTRAVENOUS

## 2017-01-14 MED ORDER — CEFAZOLIN SODIUM-DEXTROSE 2-4 GM/100ML-% IV SOLN
2.0000 g | INTRAVENOUS | Status: AC
  Administered 2017-01-14: 2 g via INTRAVENOUS

## 2017-01-14 NOTE — Progress Notes (Signed)
[facility] has taken pt from IR department and is enroute back to [facility].  Dressing at the left groin site is clean dry and intact.  Phone report given to [employer], Therapist, sports at [employer].

## 2017-01-14 NOTE — Progress Notes (Addendum)
Subjective: Interval History: Patient is presently on dialysis still complains of feeling weak.  Appetite is getting better.  She does not have any nausea or vomiting.  Objective: Vital signs in last 24 hours: Temp:  [98.3 F (36.8 C)-98.7 F (37.1 C)] 98.4 F (36.9 C) (11/16 0630) Pulse Rate:  [52-83] 56 (11/16 0900) Resp:  [17-18] 18 (11/16 0630) BP: (102-143)/(42-73) 105/46 (11/16 0900) SpO2:  [96 %-100 %] 96 % (11/16 0812) Weight:  [85.6 kg (188 lb 11.4 oz)] 85.6 kg (188 lb 11.4 oz) (11/16 0630) Weight change: -1.2 kg (-10.3 oz)  Intake/Output from previous day: 11/15 0701 - 11/16 0700 In: 364 [P.O.:240; IV Piggyback:124] Out: 1500 [Urine:1500] Intake/Output this shift: No intake/output data recorded.  Patient on dialysis Chest is clear to auscultation Heart exam irevealed regular rate and rhythm no murmur Abdomen: Positive bowel sounds Extremities no edema   Lab Results: Recent Labs    01/12/17 1243 01/14/17 0506  WBC 20.4* 17.3*  HGB 10.9* 9.2*  HCT 32.9* 27.9*  PLT 357 224   BMET:  Recent Labs    01/13/17 0551 01/14/17 0506  NA 134* 133*  133*  K 4.0 3.5  3.5  CL 97* 96*  96*  CO2 25 26  26   GLUCOSE 137* 124*  124*  BUN 69* 80*  82*  CREATININE 3.94* 4.33*  4.38*  CALCIUM 7.2* 6.9*  6.9*   No results for input(s): PTH in the last 72 hours. Iron Studies: No results for input(s): IRON, TIBC, TRANSFERRIN, FERRITIN in the last 72 hours.  Studies/Results: No results found.  I have reviewed the patient's current medications.  Assessment/Plan: Problem #1 renal failure: Patient on dialysis.  Presently she does not have any uremic signs and symptoms.  Her appetite is improving. 2] hyperkalemia: Her potassium has corrected. 3] history of CHF: No sign of fluid overload. 4] bone and mineral disorder: Her calcium is and phosphorus in  range 5 5] anemia: Her hemoglobin is below  her target goal.  She is started on Epogen 6] hypertension: Her blood  pressure is reasonably controlled Plan:1] We will dialyze patient  today for 4 hours.  We will change her bowels to move 4 K/2.2.5 calcium bath 2] will remove 14/9 L if systolic blood pressure remains above 90 3] will DC IV Lasix 4] we will check her CBC and renal panel in the morning. 5] patient will have tunneled catheter placed today and after that we will remove her femoral catheter.   LOS: 8 days   Caidan Hubbert S 01/14/2017,9:31 AM

## 2017-01-14 NOTE — Progress Notes (Signed)
PROGRESS NOTE    Kelli [name]   FYB:[id number]  DOB: November 20, 1950  DOA: 01/06/2017 PCP: Manon Hilding, MD   Brief Narrative:  Kelli [name] 66 year old female with a history of stroke with residual left hemiparesis, CKD stage III, essential hypertension, diabetes mellitus, peripheral vascular disease presentedwith 1 day history of shortness of breath, worsening leg edema, and chest discomfort. The patient also complains of a cough with clear sputum. She had subjective fevers and chills, but denied any headache, neck pain.The patient has chronic nausea and vomiting each morning which has been unchanged.  She complains of intermittent abdominal pain with loose stools. There is no dysuria, hematuria.U In ER: Hb 7.6. Chest x-ray showed pulmonary edema with BNP 966. The patientwas started on IV Lasix but remained fluid overloaded and her renal function continued to worsen. Repeat chest x-ray revealed pulmonary edema. Nephrology was consulted to assist with management and possible initiation of dialysis.  Dr. Constance Haw placed VasCath on 01/11/17.   Subjective: Sleepy again today. Eating well per husband. No new complaints.      Assessment & Plan:   Principal Problem:   Acute on chronic renal failure/ fluid overload - now ESRD - appreciate management per nephrology- plans for permanent dialysis cath today - declines to speak with palliative care  Active Problems:  acute hypoxic resp failure - fluid overload and COPD  COPD exacerbation - on Prednisone- will  Cont to wean- no wheezing -cont Nebs  Leukocytosis - due to steroids  Metabolic acidosis - on sodium Bicarb tabs- d/c'd after she was adequately dialyzed.     HTN (hypertension) - Hydralazine, Toprol    Mixed hyperlipidemia - Lipitor    Elevated troponin - due to fluid overload vs CKD flat trend    Symptomatic anemia - hemolysis labs negative - likely due to chronic disease - Hb 7.6 on 11/8- given 2 u PRBC  on 11/8 and Hb since has been stable    Diabetes mellitus type 2 in obese  - cont ISS   Hx of Stroke with left hemiparesis  -continue ASA - lipid panel--LDL57 - refuses SNF  H/o CVA with Left hemiparesis - wheelchair bound  Severe fatigue - declines SNF- will go home   DVT prophylaxis: Lovenox Code Status: Full code Family Communication: husband Disposition Plan: home when stable-  Consultants:   nephro  Palliative care Procedures:   HD cath Antimicrobials:  Anti-infectives (From admission, onward)   None       Objective: Vitals:   01/14/17 1030 01/14/17 1045 01/14/17 1100 01/14/17 1148  BP: (!) 98/44 (!) 96/42 (!) 114/53 (!) 127/59  Pulse: (!) 46 (!) 57 (!) 55 82  Resp:   20 20  Temp:   98.7 F (37.1 C) 98.7 F (37.1 C)  TempSrc:   Oral Oral  SpO2:    99%  Weight:      Height:        Intake/Output Summary (Last 24 hours) at 01/14/2017 1309 Last data filed at 01/14/2017 1108 Gross per 24 hour  Intake 305 ml  Output 2900 ml  Net -2595 ml   Filed Weights   01/13/17 0600 01/14/17 0532 01/14/17 0630  Weight: 85.8 kg (189 lb 1.6 oz) 85.6 kg (188 lb 11.4 oz) 85.6 kg (188 lb 11.4 oz)    Examination: General exam: Appears comfortable - sleepy again HEENT: PERRLA, oral mucosa moist, no sclera icterus or thrush Respiratory system: Clear to auscultation. Respiratory effort normal. Cardiovascular system: S1 & S2 heard,  RRR.  No murmurs  Gastrointestinal system: Abdomen soft, non-tender, nondistended. Normal bowel sound. No organomegaly Central nervous system: Alert  No focal neurological deficits. Extremities: No cyanosis, clubbing or edema Skin: No rashes or ulcers Psychiatry:  Mood & affect appropriate.     Data Reviewed: I have personally reviewed following labs and imaging studies  CBC: Recent Labs  Lab 01/11/17 0615 01/11/17 1340 01/12/17 1243 01/14/17 0506  WBC 15.5* 15.2* 20.4* 17.3*  HGB 10.6* 10.2* 10.9* 9.2*  HCT 31.8* 30.8* 32.9*  27.9*  MCV 86.4 86.8 86.1 87.7  PLT 410* 411* 357 947   Basic Metabolic Panel: Recent Labs  Lab 01/10/17 0535 01/11/17 0615 01/12/17 0618 01/13/17 0551 01/14/17 0506  NA 131* 132* 134* 134* 133*  133*  K 5.0 6.1* 4.4 4.0 3.5  3.5  CL 99* 100* 99* 97* 96*  96*  CO2 17* 18* 22 25 26  26   GLUCOSE 205* 209* 206* 137* 124*  124*  BUN 112* 132* 108* 69* 80*  82*  CREATININE 5.86* 6.34* 5.21* 3.94* 4.33*  4.38*  CALCIUM 6.8* 7.2* 7.6* 7.2* 6.9*  6.9*  PHOS 6.9* 7.3* 6.5*  --  4.8*  4.8*   GFR: Estimated Creatinine Clearance: 13.8 mL/min (A) (by C-G formula based on SCr of 4.33 mg/dL (H)). Liver Function Tests: Recent Labs  Lab 01/10/17 0535 01/11/17 0615 01/12/17 0618 01/14/17 0506  ALBUMIN 3.2* 3.2* 2.9* 2.6*  2.5*   No results for input(s): LIPASE, AMYLASE in the last 168 hours. No results for input(s): AMMONIA in the last 168 hours. Coagulation Profile: Recent Labs  Lab 01/13/17 1142  INR 1.05   Cardiac Enzymes: Recent Labs  Lab 01/07/17 1321  TROPONINI 0.06*   BNP (last 3 results) No results for input(s): PROBNP in the last 8760 hours. HbA1C: No results for input(s): HGBA1C in the last 72 hours. CBG: Recent Labs  Lab 01/13/17 1645 01/13/17 2148 01/14/17 0507 01/14/17 0717 01/14/17 1106  GLUCAP 209* 198* 129* 108* 131*   Lipid Profile: No results for input(s): CHOL, HDL, LDLCALC, TRIG, CHOLHDL, LDLDIRECT in the last 72 hours. Thyroid Function Tests: No results for input(s): TSH, T4TOTAL, FREET4, T3FREE, THYROIDAB in the last 72 hours. Anemia Panel: No results for input(s): VITAMINB12, FOLATE, FERRITIN, TIBC, IRON, RETICCTPCT in the last 72 hours. Urine analysis:    Component Value Date/Time   COLORURINE YELLOW 01/10/2017 1150   APPEARANCEUR CLOUDY (A) 01/10/2017 1150   LABSPEC 1.011 01/10/2017 1150   PHURINE 7.0 01/10/2017 1150   GLUCOSEU NEGATIVE 01/10/2017 1150   HGBUR NEGATIVE 01/10/2017 1150   BILIRUBINUR NEGATIVE 01/10/2017 1150     KETONESUR NEGATIVE 01/10/2017 1150   PROTEINUR 100 (A) 01/10/2017 1150   UROBILINOGEN 0.2 04/25/2014 2315   NITRITE NEGATIVE 01/10/2017 1150   LEUKOCYTESUR MODERATE (A) 01/10/2017 1150   Sepsis Labs: @LABRCNTIP (procalcitonin:4,lacticidven:4) )No results found for this or any previous visit (from the past 240 hour(s)).       Radiology Studies: No results found.    Scheduled Meds: . aspirin EC  81 mg Oral Daily  . atorvastatin  40 mg Oral q1800  . budesonide (PULMICORT) nebulizer solution  0.5 mg Nebulization BID  . calcitRIOL  0.25 mcg Oral Daily  . calcium acetate  667 mg Oral TID WC  . [START ON 01/15/2017] enoxaparin (LOVENOX) injection  30 mg Subcutaneous Q24H  . hydrALAZINE  50 mg Oral TID  . insulin aspart  0-15 Units Subcutaneous TID WC  . insulin aspart  0-5 Units Subcutaneous QHS  .  ipratropium-albuterol  3 mL Nebulization TID  . loratadine  10 mg Oral Daily  . mouth rinse  15 mL Mouth Rinse BID  . metoprolol succinate  50 mg Oral Daily  . predniSONE  10 mg Oral Q breakfast  . sodium chloride flush  3 mL Intravenous Q12H  . venlafaxine XR  150 mg Oral Q breakfast   Continuous Infusions: . sodium chloride    . sodium chloride    . sodium chloride    . sodium chloride    . sodium chloride    . sodium chloride       LOS: 8 days    Time spent in minutes: 35    Debbe Odea, MD [facility] Pager: [email or web address] Password [email or web address] 01/14/2017, 1:09 PM

## 2017-01-14 NOTE — Progress Notes (Signed)
PT Cancellation Note  Patient Details Name: Kelli [name] MRN: [id number] DOB: 1950-05-29   Cancelled Treatment:    Reason Eval/Treat Not Completed: Patient at procedure or test/unavailable.     3:57 PM, 01/14/17 Lonell Grandchild, MPT Physical Therapist with [facility] [phone] office [phone] mobile phone

## 2017-01-14 NOTE — Procedures (Signed)
Pre-procedure Diagnosis: ESRD Post-procedure Diagnosis: Same  Successful placement of tunneled HD catheter with tips terminating within the superior aspect of the right atrium.    Complications: None Immediate  EBL: Minimal   The catheter is ready for immediate use.   Jay Ajee Heasley, MD Pager #: [phone]   

## 2017-01-15 LAB — RENAL FUNCTION PANEL
ALBUMIN: 2.6 g/dL — AB (ref 3.5–5.0)
ANION GAP: 12 (ref 5–15)
BUN: 54 mg/dL — ABNORMAL HIGH (ref 6–20)
CALCIUM: 6.9 mg/dL — AB (ref 8.9–10.3)
CO2: 26 mmol/L (ref 22–32)
CREATININE: 3.54 mg/dL — AB (ref 0.44–1.00)
Chloride: 98 mmol/L — ABNORMAL LOW (ref 101–111)
GFR, EST AFRICAN AMERICAN: 14 mL/min — AB (ref >60)
GFR, EST NON AFRICAN AMERICAN: 12 mL/min — AB (ref >60)
Glucose, Bld: 127 mg/dL — ABNORMAL HIGH (ref 65–99)
PHOSPHORUS: 3.8 mg/dL (ref 2.5–4.6)
Potassium: 3.7 mmol/L (ref 3.5–5.1)
SODIUM: 136 mmol/L (ref 135–145)

## 2017-01-15 LAB — CBC
HCT: 27.7 % — ABNORMAL LOW (ref 36.0–46.0)
HEMOGLOBIN: 8.7 g/dL — AB (ref 12.0–15.0)
MCH: 28.2 pg (ref 26.0–34.0)
MCHC: 31.4 g/dL (ref 30.0–36.0)
MCV: 89.9 fL (ref 78.0–100.0)
PLATELETS: 213 10*3/uL (ref 150–400)
RBC: 3.08 MIL/uL — AB (ref 3.87–5.11)
RDW: 14.6 % (ref 11.5–15.5)
WBC: 16.8 10*3/uL — AB (ref 4.0–10.5)

## 2017-01-15 LAB — GLUCOSE, CAPILLARY
Glucose-Capillary: 120 mg/dL — ABNORMAL HIGH (ref 65–99)
Glucose-Capillary: 268 mg/dL — ABNORMAL HIGH (ref 65–99)
Glucose-Capillary: 151 mg/dL — ABNORMAL HIGH (ref 65–99)
Glucose-Capillary: 195 mg/dL — ABNORMAL HIGH (ref 65–99)

## 2017-01-15 MED ORDER — TRAMADOL HCL 50 MG PO TABS: 50.0000 mg | ORAL_TABLET | Freq: Two times a day (BID) | ORAL | Status: DC | PRN

## 2017-01-15 MED ORDER — IBUPROFEN 400 MG PO TABS: 400.0000 mg | ORAL_TABLET | Freq: Two times a day (BID) | ORAL | Status: DC | PRN

## 2017-01-15 MED ORDER — PREDNISONE 10 MG PO TABS
5.0000 mg | ORAL_TABLET | Freq: Every day | ORAL | Status: DC
  Administered 2017-01-15 – 2017-01-16 (×2): 5 mg via ORAL
  Filled 2017-01-15 (×2): qty 1

## 2017-01-15 NOTE — Progress Notes (Signed)
PROGRESS NOTE    Kelli [name]   DGL:[id number]  DOB: 06-04-1950  DOA: 01/06/2017 PCP: Manon Hilding, MD   Brief Narrative:  Kelli [name] 66 year old female with a history of stroke with residual left hemiparesis, CKD stage III, essential hypertension, diabetes mellitus, peripheral vascular disease presentedwith 1 day history of shortness of breath, worsening leg edema, and chest discomfort. The patient also complains of a cough with clear sputum. She had subjective fevers and chills, but denied any headache, neck pain.The patient has chronic nausea and vomiting each morning which has been unchanged.  She complains of intermittent abdominal pain with loose stools. There is no dysuria, hematuria.U In ER: Hb 7.6. Chest x-ray showed pulmonary edema with BNP 966. The patientwas started on IV Lasix but remained fluid overloaded and her renal function continued to worsen. Repeat chest x-ray revealed pulmonary edema. Nephrology was consulted to assist with management and possible initiation of dialysis.  Dr. Constance Haw placed VasCath on 01/11/17.   Subjective: Alert and watching TV this AM. She has no complaints. Specifically, no cough, dyspnea, congestion, dysuria, nausea, vomiting diarrhea or constipation.     Assessment & Plan:   Principal Problem:   Acute on chronic renal failure/ fluid overload - now ESRD - appreciate management per nephrology - 11/16 permanent tunneled dialysis cath placed by IR - not interedsted in palliative care  Active Problems:  acute hypoxic resp failure - fluid overload and COPD  COPD exacerbation - on Prednisone- will Cont to wean- down to 5 mg today - no wheezing -cont Nebs  Leukocytosis - due to steroids  Metabolic acidosis - on sodium Bicarb tabs- d/c'd after she was adequately dialyzed.     HTN (hypertension) - Hydralazine, Toprol    Mixed hyperlipidemia - Lipitor    Elevated troponin - due to fluid overload vs CKD flat  trend    Symptomatic anemia - hemolysis labs negative - likely due to chronic disease - Hb 7.6 on 11/8- given 2 u PRBC on 11/8 and Hb since has been stable    Diabetes mellitus type 2 in obese  - cont ISS   Hx of Stroke with left hemiparesis  -continue ASA - lipid panel--LDL57 - refuses SNF  H/o CVA with Left hemiparesis - wheelchair bound  Severe fatigue/ depression with flat affect - declines SNF and declines palliative care- will go home   DVT prophylaxis: Lovenox Code Status: Full code Family Communication: husband Disposition Plan: home when stable-  Consultants:   nephro  Palliative care  IR Procedures:   HD cath by surgery  Tunneled dialysis cath per IR Antimicrobials:  Anti-infectives (From admission, onward)   Start     Dose/Rate Route Frequency Ordered Stop   01/14/17 1415  ceFAZolin (ANCEF) IVPB 2g/100 mL premix     2 g 200 mL/hr over 30 Minutes Intravenous To Radiology 01/14/17 1413 01/14/17 1455       Objective: Vitals:   01/15/17 0623 01/15/17 0801 01/15/17 0809 01/15/17 0958  BP: 132/65   107/61  Pulse: 79   90  Resp: 18     Temp: 98.6 F (37 C)     TempSrc: Oral     SpO2: 99% 98% 100%   Weight: 84.4 kg (186 lb 1.6 oz)     Height:        Intake/Output Summary (Last 24 hours) at 01/15/2017 1245 Last data filed at 01/15/2017 1004 Gross per 24 hour  Intake 743 ml  Output 100 ml  Net 643  ml   Filed Weights   01/14/17 0532 01/14/17 0630 01/15/17 0623  Weight: 85.6 kg (188 lb 11.4 oz) 85.6 kg (188 lb 11.4 oz) 84.4 kg (186 lb 1.6 oz)    Examination: General exam: Appears comfortable -  HEENT: PERRLA, oral mucosa moist, no sclera icterus or thrush Respiratory system: Clear to auscultation. Respiratory effort normal. Cardiovascular system: S1 & S2 heard, RRR.  No murmurs  Gastrointestinal system: Abdomen soft, non-tender, nondistended. Normal bowel sound. No organomegaly Central nervous system: Alert  No focal neurological  deficits. Extremities: No cyanosis, clubbing or edema Skin: No rashes or ulcers Psychiatry:  Mood & affect appropriate.     Data Reviewed: I have personally reviewed following labs and imaging studies  CBC: Recent Labs  Lab 01/11/17 0615 01/11/17 1340 01/12/17 1243 01/14/17 0506 01/15/17 0657  WBC 15.5* 15.2* 20.4* 17.3* 16.8*  HGB 10.6* 10.2* 10.9* 9.2* 8.7*  HCT 31.8* 30.8* 32.9* 27.9* 27.7*  MCV 86.4 86.8 86.1 87.7 89.9  PLT 410* 411* 357 224 438   Basic Metabolic Panel: Recent Labs  Lab 01/10/17 0535 01/11/17 0615 01/12/17 0618 01/13/17 0551 01/14/17 0506 01/15/17 0657  NA 131* 132* 134* 134* 133*  133* 136  K 5.0 6.1* 4.4 4.0 3.5  3.5 3.7  CL 99* 100* 99* 97* 96*  96* 98*  CO2 17* 18* 22 25 26  26 26   GLUCOSE 205* 209* 206* 137* 124*  124* 127*  BUN 112* 132* 108* 69* 80*  82* 54*  CREATININE 5.86* 6.34* 5.21* 3.94* 4.33*  4.38* 3.54*  CALCIUM 6.8* 7.2* 7.6* 7.2* 6.9*  6.9* 6.9*  PHOS 6.9* 7.3* 6.5*  --  4.8*  4.8* 3.8   GFR: Estimated Creatinine Clearance: 16.8 mL/min (A) (by C-G formula based on SCr of 3.54 mg/dL (H)). Liver Function Tests: Recent Labs  Lab 01/10/17 0535 01/11/17 0615 01/12/17 0618 01/14/17 0506 01/15/17 0657  ALBUMIN 3.2* 3.2* 2.9* 2.6*  2.5* 2.6*   No results for input(s): LIPASE, AMYLASE in the last 168 hours. No results for input(s): AMMONIA in the last 168 hours. Coagulation Profile: Recent Labs  Lab 01/13/17 1142  INR 1.05   Cardiac Enzymes: No results for input(s): CKTOTAL, CKMB, CKMBINDEX, TROPONINI in the last 168 hours. BNP (last 3 results) No results for input(s): PROBNP in the last 8760 hours. HbA1C: No results for input(s): HGBA1C in the last 72 hours. CBG: Recent Labs  Lab 01/14/17 1106 01/14/17 1630 01/14/17 2150 01/15/17 0740 01/15/17 1123  GLUCAP 131* 126* 130* 120* 268*   Lipid Profile: No results for input(s): CHOL, HDL, LDLCALC, TRIG, CHOLHDL, LDLDIRECT in the last 72 hours. Thyroid  Function Tests: No results for input(s): TSH, T4TOTAL, FREET4, T3FREE, THYROIDAB in the last 72 hours. Anemia Panel: No results for input(s): VITAMINB12, FOLATE, FERRITIN, TIBC, IRON, RETICCTPCT in the last 72 hours. Urine analysis:    Component Value Date/Time   COLORURINE YELLOW 01/10/2017 1150   APPEARANCEUR CLOUDY (A) 01/10/2017 1150   LABSPEC 1.011 01/10/2017 1150   PHURINE 7.0 01/10/2017 1150   GLUCOSEU NEGATIVE 01/10/2017 1150   HGBUR NEGATIVE 01/10/2017 1150   BILIRUBINUR NEGATIVE 01/10/2017 1150   KETONESUR NEGATIVE 01/10/2017 1150   PROTEINUR 100 (A) 01/10/2017 1150   UROBILINOGEN 0.2 04/25/2014 2315   NITRITE NEGATIVE 01/10/2017 1150   LEUKOCYTESUR MODERATE (A) 01/10/2017 1150   Sepsis Labs: @LABRCNTIP (procalcitonin:4,lacticidven:4) )No results found for this or any previous visit (from the past 240 hour(s)).       Radiology Studies: Ir Fluoro Guide Cv  Line Left  Result Date: 01/14/2017 INDICATION: End-stage renal disease, in need of durable intravenous access for continuation of dialysis. EXAM: TUNNELED CENTRAL VENOUS HEMODIALYSIS CATHETER PLACEMENT WITH ULTRASOUND AND FLUOROSCOPIC GUIDANCE MEDICATIONS: Ancef 2 gm IV . The antibiotic was given in an appropriate time interval prior to skin puncture. ANESTHESIA/SEDATION: Versed 2 mg IV; Fentanyl 50 mcg IV; Moderate Sedation Time:  16 minutes The patient was continuously monitored during the procedure by the interventional radiology nurse under my direct supervision. FLUOROSCOPY TIME:  Fluoroscopy Time: 36 seconds (5 mGy). COMPLICATIONS: None immediate. PROCEDURE: Informed written consent was obtained from the patient after a discussion of the risks, benefits, and alternatives to treatment. Questions regarding the procedure were encouraged and answered. The left neck and chest were prepped with chlorhexidine in a sterile fashion, and a sterile drape was applied covering the operative field. Maximum barrier sterile technique  with sterile gowns and gloves were used for the procedure. A timeout was performed prior to the initiation of the procedure. After creating a small venotomy incision, a micropuncture kit was utilized to access the internal jugular vein. Real-time ultrasound guidance was utilized for vascular access including the acquisition of a permanent ultrasound image documenting patency of the accessed vessel. The microwire was utilized to measure appropriate catheter length. A stiff Glidewire was advanced to the level of the IVC and the micropuncture sheath was exchanged for a peel-away sheath. A palindrome tunneled hemodialysis catheter measuring 23 cm from tip to cuff was tunneled in a retrograde fashion from the anterior chest wall to the venotomy incision. The catheter was then placed through the peel-away sheath with tips ultimately positioned within the superior aspect of the right atrium. Final catheter positioning was confirmed and documented with a spot radiographic image. The catheter aspirates and flushes normally. The catheter was flushed with appropriate volume heparin dwells. The catheter exit site was secured with a 0-Prolene retention suture. The venotomy incision was closed with an interrupted 4-0 Vicryl, Dermabond and Steri-strips. Dressings were applied. The patient tolerated the procedure well without immediate post procedural complication. IMPRESSION: Successful placement of 23 cm tip to cuff tunneled hemodialysis catheter via the left internal jugular vein with tips terminating within the superior aspect of the right atrium. The catheter is ready for immediate use. Electronically Signed   By: Sandi Mariscal M.D.   On: 01/14/2017 15:28   Ir US Guide Vasc Access Left  Result Date: 01/14/2017 INDICATION: End-stage renal disease, in need of durable intravenous access for continuation of dialysis. EXAM: TUNNELED CENTRAL VENOUS HEMODIALYSIS CATHETER PLACEMENT WITH ULTRASOUND AND FLUOROSCOPIC GUIDANCE  MEDICATIONS: Ancef 2 gm IV . The antibiotic was given in an appropriate time interval prior to skin puncture. ANESTHESIA/SEDATION: Versed 2 mg IV; Fentanyl 50 mcg IV; Moderate Sedation Time:  16 minutes The patient was continuously monitored during the procedure by the interventional radiology nurse under my direct supervision. FLUOROSCOPY TIME:  Fluoroscopy Time: 36 seconds (5 mGy). COMPLICATIONS: None immediate. PROCEDURE: Informed written consent was obtained from the patient after a discussion of the risks, benefits, and alternatives to treatment. Questions regarding the procedure were encouraged and answered. The left neck and chest were prepped with chlorhexidine in a sterile fashion, and a sterile drape was applied covering the operative field. Maximum barrier sterile technique with sterile gowns and gloves were used for the procedure. A timeout was performed prior to the initiation of the procedure. After creating a small venotomy incision, a micropuncture kit was utilized to access the internal jugular vein. Real-time  ultrasound guidance was utilized for vascular access including the acquisition of a permanent ultrasound image documenting patency of the accessed vessel. The microwire was utilized to measure appropriate catheter length. A stiff Glidewire was advanced to the level of the IVC and the micropuncture sheath was exchanged for a peel-away sheath. A palindrome tunneled hemodialysis catheter measuring 23 cm from tip to cuff was tunneled in a retrograde fashion from the anterior chest wall to the venotomy incision. The catheter was then placed through the peel-away sheath with tips ultimately positioned within the superior aspect of the right atrium. Final catheter positioning was confirmed and documented with a spot radiographic image. The catheter aspirates and flushes normally. The catheter was flushed with appropriate volume heparin dwells. The catheter exit site was secured with a 0-Prolene  retention suture. The venotomy incision was closed with an interrupted 4-0 Vicryl, Dermabond and Steri-strips. Dressings were applied. The patient tolerated the procedure well without immediate post procedural complication. IMPRESSION: Successful placement of 23 cm tip to cuff tunneled hemodialysis catheter via the left internal jugular vein with tips terminating within the superior aspect of the right atrium. The catheter is ready for immediate use. Electronically Signed   By: Sandi Mariscal M.D.   On: 01/14/2017 15:28      Scheduled Meds: . aspirin EC  81 mg Oral Daily  . atorvastatin  40 mg Oral q1800  . budesonide (PULMICORT) nebulizer solution  0.5 mg Nebulization BID  . calcitRIOL  0.25 mcg Oral Daily  . calcium acetate  667 mg Oral TID WC  . enoxaparin (LOVENOX) injection  30 mg Subcutaneous Q24H  . hydrALAZINE  50 mg Oral TID  . insulin aspart  0-15 Units Subcutaneous TID WC  . insulin aspart  0-5 Units Subcutaneous QHS  . ipratropium-albuterol  3 mL Nebulization TID  . loratadine  10 mg Oral Daily  . mouth rinse  15 mL Mouth Rinse BID  . metoprolol succinate  50 mg Oral Daily  . predniSONE  5 mg Oral Q breakfast  . sodium chloride flush  3 mL Intravenous Q12H  . venlafaxine XR  150 mg Oral Q breakfast   Continuous Infusions: . sodium chloride    . sodium chloride    . sodium chloride    . sodium chloride    . sodium chloride    . sodium chloride       LOS: 9 days    Time spent in minutes: 35    Debbe Odea, MD [facility] Pager: [email or web address] Password [email or web address] 01/15/2017, 12:45 PM

## 2017-01-15 NOTE — Progress Notes (Signed)
Subjective: Interval History: Patient feeling somewhat better.  She did not have any nausea or vomiting.  Appetite is still poor but improving.  Objective: Vital signs in last 24 hours: Temp:  [98.5 F (36.9 C)-99.2 F (37.3 C)] 98.6 F (37 C) (11/17 0623) Pulse Rate:  [45-92] 79 (11/17 0623) Resp:  [12-20] 18 (11/17 0623) BP: (87-147)/(42-77) 132/65 (11/17 0623) SpO2:  [93 %-100 %] 100 % (11/17 0809) Weight:  [84.4 kg (186 lb 1.6 oz)] 84.4 kg (186 lb 1.6 oz) (11/17 0076) Weight change: -1.186 kg (-9.8 oz)  Intake/Output from previous day: 11/16 0701 - 11/17 0700 In: 226 [P.O.:720; I.V.:23] Out: 1500 [Urine:100] Intake/Output this shift: No intake/output data recorded.  Patient on dialysis Chest is clear to auscultation Heart exam irevealed regular rate and rhythm no murmur Abdomen: Positive bowel sounds Extremities no edema   Lab Results: Recent Labs    01/14/17 0506 01/15/17 0657  WBC 17.3* 16.8*  HGB 9.2* 8.7*  HCT 27.9* 27.7*  PLT 224 213   BMET:  Recent Labs    01/14/17 0506 01/15/17 0657  NA 133*  133* 136  K 3.5  3.5 3.7  CL 96*  96* 98*  CO2 _0 GLUCOSE 124*  124* 127*  BUN 80*  82* 54*  CREATININE 4.33*  4.38* 3.54*  CALCIUM 6.9*  6.9* 6.9*   No results for input(s): PTH in the last 72 hours. Iron Studies: No results for input(s): IRON, TIBC, TRANSFERRIN, FERRITIN in the last 72 hours.  Studies/Results: Ir Fluoro Guide Cv Line Left  Result Date: 01/14/2017 INDICATION: End-stage renal disease, in need of durable intravenous access for continuation of dialysis. EXAM: TUNNELED CENTRAL VENOUS HEMODIALYSIS CATHETER PLACEMENT WITH ULTRASOUND AND FLUOROSCOPIC GUIDANCE MEDICATIONS: Ancef 2 gm IV . The antibiotic was given in an appropriate time interval prior to skin puncture. ANESTHESIA/SEDATION: Versed 2 mg IV; Fentanyl 50 mcg IV; Moderate Sedation Time:  16 minutes The patient was continuously monitored during the procedure by the  interventional radiology nurse under my direct supervision. FLUOROSCOPY TIME:  Fluoroscopy Time: 36 seconds (5 mGy). COMPLICATIONS: None immediate. PROCEDURE: Informed written consent was obtained from the patient after a discussion of the risks, benefits, and alternatives to treatment. Questions regarding the procedure were encouraged and answered. The left neck and chest were prepped with chlorhexidine in a sterile fashion, and a sterile drape was applied covering the operative field. Maximum barrier sterile technique with sterile gowns and gloves were used for the procedure. A timeout was performed prior to the initiation of the procedure. After creating a small venotomy incision, a micropuncture kit was utilized to access the internal jugular vein. Real-time ultrasound guidance was utilized for vascular access including the acquisition of a permanent ultrasound image documenting patency of the accessed vessel. The microwire was utilized to measure appropriate catheter length. A stiff Glidewire was advanced to the level of the IVC and the micropuncture sheath was exchanged for a peel-away sheath. A palindrome tunneled hemodialysis catheter measuring 23 cm from tip to cuff was tunneled in a retrograde fashion from the anterior chest wall to the venotomy incision. The catheter was then placed through the peel-away sheath with tips ultimately positioned within the superior aspect of the right atrium. Final catheter positioning was confirmed and documented with a spot radiographic image. The catheter aspirates and flushes normally. The catheter was flushed with appropriate volume heparin dwells. The catheter exit site was secured with a 0-Prolene retention suture. The venotomy incision was closed with  an interrupted 4-0 Vicryl, Dermabond and Steri-strips. Dressings were applied. The patient tolerated the procedure well without immediate post procedural complication. IMPRESSION: Successful placement of 23 cm tip to  cuff tunneled hemodialysis catheter via the left internal jugular vein with tips terminating within the superior aspect of the right atrium. The catheter is ready for immediate use. Electronically Signed   By: Sandi Mariscal M.D.   On: 01/14/2017 15:28   Ir US Guide Vasc Access Left  Result Date: 01/14/2017 INDICATION: End-stage renal disease, in need of durable intravenous access for continuation of dialysis. EXAM: TUNNELED CENTRAL VENOUS HEMODIALYSIS CATHETER PLACEMENT WITH ULTRASOUND AND FLUOROSCOPIC GUIDANCE MEDICATIONS: Ancef 2 gm IV . The antibiotic was given in an appropriate time interval prior to skin puncture. ANESTHESIA/SEDATION: Versed 2 mg IV; Fentanyl 50 mcg IV; Moderate Sedation Time:  16 minutes The patient was continuously monitored during the procedure by the interventional radiology nurse under my direct supervision. FLUOROSCOPY TIME:  Fluoroscopy Time: 36 seconds (5 mGy). COMPLICATIONS: None immediate. PROCEDURE: Informed written consent was obtained from the patient after a discussion of the risks, benefits, and alternatives to treatment. Questions regarding the procedure were encouraged and answered. The left neck and chest were prepped with chlorhexidine in a sterile fashion, and a sterile drape was applied covering the operative field. Maximum barrier sterile technique with sterile gowns and gloves were used for the procedure. A timeout was performed prior to the initiation of the procedure. After creating a small venotomy incision, a micropuncture kit was utilized to access the internal jugular vein. Real-time ultrasound guidance was utilized for vascular access including the acquisition of a permanent ultrasound image documenting patency of the accessed vessel. The microwire was utilized to measure appropriate catheter length. A stiff Glidewire was advanced to the level of the IVC and the micropuncture sheath was exchanged for a peel-away sheath. A palindrome tunneled hemodialysis  catheter measuring 23 cm from tip to cuff was tunneled in a retrograde fashion from the anterior chest wall to the venotomy incision. The catheter was then placed through the peel-away sheath with tips ultimately positioned within the superior aspect of the right atrium. Final catheter positioning was confirmed and documented with a spot radiographic image. The catheter aspirates and flushes normally. The catheter was flushed with appropriate volume heparin dwells. The catheter exit site was secured with a 0-Prolene retention suture. The venotomy incision was closed with an interrupted 4-0 Vicryl, Dermabond and Steri-strips. Dressings were applied. The patient tolerated the procedure well without immediate post procedural complication. IMPRESSION: Successful placement of 23 cm tip to cuff tunneled hemodialysis catheter via the left internal jugular vein with tips terminating within the superior aspect of the right atrium. The catheter is ready for immediate use. Electronically Signed   By: Sandi Mariscal M.D.   On: 01/14/2017 15:28    I have reviewed the patient's current medications.  Assessment/Plan: Problem #1 renal failure: Patient on dialysis.  S/P Hemodialysis yesterday. Her potassium is normal and appetite isimproving. 2] hyperkalemia: her potassium is normal 3] history of CHF: No sign of fluid overload. 4] bone and mineral disorder: Her calcium is and phosphorus in  range  5] anemia: Her hemoglobin is below  her target goal.  She is  on Epogen during dialysis 6] hypertension: Her blood pressure is reasonably controlled Plan:1] Patient does not require dialysis today. Her next dialysis will be on Monday 2]Check CBC and renal panel in am 3]Patient can be discharged after out patient dialysis is arranged  LOS: 9 days   Mattingly Fountaine S 01/15/2017,9:29 AM

## 2017-01-15 NOTE — Progress Notes (Signed)
PT Cancellation Note  Patient Details Name: Kelli [name] MRN: [id number] DOB: 1950-08-30   Cancelled Treatment:    Reason Eval/Treat Not Completed: Patient declined, no reason specified(feeling too tired, agrees to participate if PT retrun before lunch tomorrow)  Patient semi-reclined in bed upon PT arrival with husband at bedside. Patient declining to participate secondary to fatigue. PT spend ~ 7 minutes educating patient on benefits of mobilizing to prevent deconditioning and promote healthy respiratory and digestive function. PT discussed benefits of sitting at side of bed and up in chair for meals. Patient avoiding eye contact with PT throughout conversation and eventually  states she will sit at EOB for dinner or her husband and RN can help her to the chair. She agreed to work with PT before lunch tomorrow in order to sit up to eat. Pt will attempt tomorrow.  Debara Pickett, PT, DPT Physical Therapist with [facility]  01/15/2017 1:57 PM

## 2017-01-16 DIAGNOSIS — I471 Supraventricular tachycardia: Secondary | ICD-10-CM

## 2017-01-16 LAB — GLUCOSE, CAPILLARY
GLUCOSE-CAPILLARY: 121 mg/dL — AB (ref 65–99)
Glucose-Capillary: 167 mg/dL — ABNORMAL HIGH (ref 65–99)
Glucose-Capillary: 166 mg/dL — ABNORMAL HIGH (ref 65–99)
Glucose-Capillary: 158 mg/dL — ABNORMAL HIGH (ref 65–99)

## 2017-01-16 LAB — RENAL FUNCTION PANEL
ALBUMIN: 2.5 g/dL — AB (ref 3.5–5.0)
ANION GAP: 10 (ref 5–15)
BUN: 63 mg/dL — AB (ref 6–20)
CALCIUM: 6.7 mg/dL — AB (ref 8.9–10.3)
CO2: 27 mmol/L (ref 22–32)
Chloride: 99 mmol/L — ABNORMAL LOW (ref 101–111)
Creatinine, Ser: 4.02 mg/dL — ABNORMAL HIGH (ref 0.44–1.00)
GFR calc Af Amer: 12 mL/min — ABNORMAL LOW (ref >60)
GFR, EST NON AFRICAN AMERICAN: 11 mL/min — AB (ref >60)
GLUCOSE: 127 mg/dL — AB (ref 65–99)
PHOSPHORUS: 4 mg/dL (ref 2.5–4.6)
POTASSIUM: 3.6 mmol/L (ref 3.5–5.1)
SODIUM: 136 mmol/L (ref 135–145)

## 2017-01-16 MED ORDER — IPRATROPIUM-ALBUTEROL 0.5-2.5 (3) MG/3ML IN SOLN
3.0000 mL | Freq: Four times a day (QID) | RESPIRATORY_TRACT | Status: DC | PRN
  Administered 2017-01-16: 3 mL via RESPIRATORY_TRACT
  Filled 2017-01-16: qty 3

## 2017-01-16 NOTE — Progress Notes (Signed)
Subjective: Interval History: Patient her appetite is getting better but refused to eat because the food does not contain any salt.  She denies any nausea or vomiting.  Objective: Vital signs in last 24 hours: Temp:  [98 F (36.7 C)-98.2 F (36.8 C)] 98 F (36.7 C) (11/18 0642) Pulse Rate:  [78-88] 88 (11/18 0642) Resp:  [18] 18 (11/18 0642) BP: (121-171)/(58-81) 134/58 (11/18 0642) SpO2:  [97 %-100 %] 97 % (11/18 0737) Weight:  [84.7 kg (186 lb 12.8 oz)] 84.7 kg (186 lb 12.8 oz) (11/18 7616) Weight change: 0.318 kg (11.2 oz)  Intake/Output from previous day: 11/17 0701 - 11/18 0700 In: 863 [P.O.:840; I.V.:23] Out: 1200 [Urine:1200] Intake/Output this shift: No intake/output data recorded.  Patient on dialysis Chest is clear to auscultation Heart exam irevealed regular rate and rhythm no murmur Abdomen: Positive bowel sounds Extremities no edema   Lab Results: Recent Labs    01/14/17 0506 01/15/17 0657  WBC 17.3* 16.8*  HGB 9.2* 8.7*  HCT 27.9* 27.7*  PLT 224 213   BMET:  Recent Labs    01/15/17 0657 01/16/17 0644  NA 136 136  K 3.7 3.6  CL 98* 99*  CO2 26 27  GLUCOSE 127* 127*  BUN 54* 63*  CREATININE 3.54* 4.02*  CALCIUM 6.9* 6.7*   No results for input(s): PTH in the last 72 hours. Iron Studies: No results for input(s): IRON, TIBC, TRANSFERRIN, FERRITIN in the last 72 hours.  Studies/Results: Ir Fluoro Guide Cv Line Left  Result Date: 01/14/2017 INDICATION: End-stage renal disease, in need of durable intravenous access for continuation of dialysis. EXAM: TUNNELED CENTRAL VENOUS HEMODIALYSIS CATHETER PLACEMENT WITH ULTRASOUND AND FLUOROSCOPIC GUIDANCE MEDICATIONS: Ancef 2 gm IV . The antibiotic was given in an appropriate time interval prior to skin puncture. ANESTHESIA/SEDATION: Versed 2 mg IV; Fentanyl 50 mcg IV; Moderate Sedation Time:  16 minutes The patient was continuously monitored during the procedure by the interventional radiology nurse under  my direct supervision. FLUOROSCOPY TIME:  Fluoroscopy Time: 36 seconds (5 mGy). COMPLICATIONS: None immediate. PROCEDURE: Informed written consent was obtained from the patient after a discussion of the risks, benefits, and alternatives to treatment. Questions regarding the procedure were encouraged and answered. The left neck and chest were prepped with chlorhexidine in a sterile fashion, and a sterile drape was applied covering the operative field. Maximum barrier sterile technique with sterile gowns and gloves were used for the procedure. A timeout was performed prior to the initiation of the procedure. After creating a small venotomy incision, a micropuncture kit was utilized to access the internal jugular vein. Real-time ultrasound guidance was utilized for vascular access including the acquisition of a permanent ultrasound image documenting patency of the accessed vessel. The microwire was utilized to measure appropriate catheter length. A stiff Glidewire was advanced to the level of the IVC and the micropuncture sheath was exchanged for a peel-away sheath. A palindrome tunneled hemodialysis catheter measuring 23 cm from tip to cuff was tunneled in a retrograde fashion from the anterior chest wall to the venotomy incision. The catheter was then placed through the peel-away sheath with tips ultimately positioned within the superior aspect of the right atrium. Final catheter positioning was confirmed and documented with a spot radiographic image. The catheter aspirates and flushes normally. The catheter was flushed with appropriate volume heparin dwells. The catheter exit site was secured with a 0-Prolene retention suture. The venotomy incision was closed with an interrupted 4-0 Vicryl, Dermabond and Steri-strips. Dressings were applied. The  patient tolerated the procedure well without immediate post procedural complication. IMPRESSION: Successful placement of 23 cm tip to cuff tunneled hemodialysis catheter via  the left internal jugular vein with tips terminating within the superior aspect of the right atrium. The catheter is ready for immediate use. Electronically Signed   By: Sandi Mariscal M.D.   On: 01/14/2017 15:28   Ir US Guide Vasc Access Left  Result Date: 01/14/2017 INDICATION: End-stage renal disease, in need of durable intravenous access for continuation of dialysis. EXAM: TUNNELED CENTRAL VENOUS HEMODIALYSIS CATHETER PLACEMENT WITH ULTRASOUND AND FLUOROSCOPIC GUIDANCE MEDICATIONS: Ancef 2 gm IV . The antibiotic was given in an appropriate time interval prior to skin puncture. ANESTHESIA/SEDATION: Versed 2 mg IV; Fentanyl 50 mcg IV; Moderate Sedation Time:  16 minutes The patient was continuously monitored during the procedure by the interventional radiology nurse under my direct supervision. FLUOROSCOPY TIME:  Fluoroscopy Time: 36 seconds (5 mGy). COMPLICATIONS: None immediate. PROCEDURE: Informed written consent was obtained from the patient after a discussion of the risks, benefits, and alternatives to treatment. Questions regarding the procedure were encouraged and answered. The left neck and chest were prepped with chlorhexidine in a sterile fashion, and a sterile drape was applied covering the operative field. Maximum barrier sterile technique with sterile gowns and gloves were used for the procedure. A timeout was performed prior to the initiation of the procedure. After creating a small venotomy incision, a micropuncture kit was utilized to access the internal jugular vein. Real-time ultrasound guidance was utilized for vascular access including the acquisition of a permanent ultrasound image documenting patency of the accessed vessel. The microwire was utilized to measure appropriate catheter length. A stiff Glidewire was advanced to the level of the IVC and the micropuncture sheath was exchanged for a peel-away sheath. A palindrome tunneled hemodialysis catheter measuring 23 cm from tip to cuff was  tunneled in a retrograde fashion from the anterior chest wall to the venotomy incision. The catheter was then placed through the peel-away sheath with tips ultimately positioned within the superior aspect of the right atrium. Final catheter positioning was confirmed and documented with a spot radiographic image. The catheter aspirates and flushes normally. The catheter was flushed with appropriate volume heparin dwells. The catheter exit site was secured with a 0-Prolene retention suture. The venotomy incision was closed with an interrupted 4-0 Vicryl, Dermabond and Steri-strips. Dressings were applied. The patient tolerated the procedure well without immediate post procedural complication. IMPRESSION: Successful placement of 23 cm tip to cuff tunneled hemodialysis catheter via the left internal jugular vein with tips terminating within the superior aspect of the right atrium. The catheter is ready for immediate use. Electronically Signed   By: Sandi Mariscal M.D.   On: 01/14/2017 15:28    I have reviewed the patient's current medications.  Assessment/Plan: Problem #1 renal failure: Patient on dialysis.  S/P Hemodialysis on Friday.  She does not have any nausea or vomiting.  Patient however unable to eat because food does not containing salt.  Complaining about the food and did not eat anything this morning. 2] hyperkalemia: her potassium is normal 3] history of CHF: No sign of fluid overload.  Patient is still making urine.  She had about 1200 cc. 4] bone and mineral disorder: Her calcium is and phosphorus in  range  5] anemia: Her hemoglobin is below  Our  target goal.  She is  on Epogen during dialysis.  Her hemoglobin is declining slightly. 6] hypertension: Her blood pressure is  reasonably controlled Plan:1] patient does not  require dialysis today 2]Check CBC and renal panel in am 3] will make arrangement for patient to get dialysis tomorrow for 4 hours. 4] we will change her diet to renal diet.    LOS: 10 days   Arlesia Kiel S 01/16/2017,10:35 AM

## 2017-01-16 NOTE — Progress Notes (Signed)
PROGRESS NOTE    Kelli [name]   RJJ:[id number]  DOB: [birth year]  DOA: 01/06/2017 PCP: Manon Hilding, MD   Brief Narrative:  Kelli [name] 66 year old female with a history of stroke with residual left hemiparesis, CKD stage III, essential hypertension, diabetes mellitus, peripheral vascular disease presentedwith 1 day history of shortness of breath, worsening leg edema, and chest discomfort. The patient also complains of a cough with clear sputum. She had subjective fevers and chills, but denied any headache, neck pain.The patient has chronic nausea and vomiting each morning which has been unchanged.  She complains of intermittent abdominal pain with loose stools. There is no dysuria, hematuria.U In ER: Hb 7.6. Chest x-ray showed pulmonary edema with BNP 966. The patientwas started on IV Lasix but remained fluid overloaded and her renal function continued to worsen. Repeat chest x-ray revealed pulmonary edema. Nephrology was consulted to assist with management and possible initiation of dialysis.  Dr. Constance Haw placed VasCath on 01/11/17.   Subjective: She feels nauseated this AM and does not want breakfast. No other complaints. Specifically, no cough, dyspnea, congestion, dysuria,  Vomiting, diarrhea or constipation.     Assessment & Plan:   Principal Problem:   Acute on chronic renal failure/acute on chronic diastolic CHF - now ESRD - appreciate management per nephrology - 11/16 permanent tunneled dialysis cath placed by IR - not interested in palliative care  Active Problems:  acute hypoxic resp failure - fluid overload and COPD  Acute wheezing- no h/o COPD - on Prednisone- today will be the last day - no wheezing -change Nebs to PRN now  Leukocytosis - due to steroids  Metabolic acidosis - on sodium Bicarb tabs- d/c'd after she was adequately dialyzed.     HTN (hypertension) - Hydralazine, Toprol    Elevated troponin - due to fluid overload vs CKD flat  trend  Sinus arrhythmia with occasional short runs of SVT - she has a loop recorded which was placed after a CVA in 2/15 - one short episode of SVT yesterday and one on 11/15 - d/c TID nebs as respiratory status has improved- cont Metoprolol and follow - dr Domenic Polite aware of the first episode - consider reviewing strip from yesterday with cardiology tomorrow when they return.    Symptomatic anemia - hemolysis labs negative - likely due to chronic disease - Hb 7.6 on 11/8- given 2 u PRBC on 11/8 and Hb since has been stable    Diabetes mellitus type 2 in obese  - cont ISS     Hx of Stroke (cryptogenic)with left hemiparesis  - as mentioned, has a loop recorder - continue ASA - lipid panel--LDL57 - refuses SNF - wheelchair bound    Mixed hyperlipidemia - Lipitor  Severe fatigue/ depression with flat affect - declines SNF and declines palliative care- will go home  Occasional leg pain - Ibuprofen and Tramadol ordered   DVT prophylaxis: Lovenox Code Status: Full code Family Communication: husband Disposition Plan: home when stable-  Consultants:   nephro  Palliative care  IR Procedures:   HD cath by surgery  Tunneled dialysis cath per IR  2 D ECHO Left ventricle: The cavity size was normal. Systolic function was   normal. The estimated ejection fraction was in the range of 55%   to 60%. Wall motion was normal; there were no regional wall   motion abnormalities. The study is not technically sufficient to   allow evaluation of LV diastolic function due to tachycardia and  E/a fusion. Moderate to severe left ventricular hypertrophy. - Aortic valve: Trileaflet; mildly thickened, mildly calcified   leaflets. Sclerosis without stenosis. There was no stenosis. - Mitral valve: Calcified annulus. - Left atrium: The atrium was mildly dilated. - Atrial septum: No defect or patent foramen ovale was identified.  Antimicrobials:  Anti-infectives (From admission, onward)    Start     Dose/Rate Route Frequency Ordered Stop   01/14/17 1415  ceFAZolin (ANCEF) IVPB 2g/100 mL premix     2 g 200 mL/hr over 30 Minutes Intravenous To Radiology 01/14/17 1413 01/14/17 1455       Objective: Vitals:   01/15/17 2033 01/15/17 2239 01/16/17 0642 01/16/17 0737  BP:  132/62 (!) 134/58   Pulse:  80 88   Resp:  18 18   Temp:  98.2 F (36.8 C) 98 F (36.7 C)   TempSrc:  Oral Oral   SpO2: 98% 100% 100% 97%  Weight:   84.7 kg (186 lb 12.8 oz)   Height:        Intake/Output Summary (Last 24 hours) at 01/16/2017 0952 Last data filed at 01/16/2017 0500 Gross per 24 hour  Intake 623 ml  Output 1200 ml  Net -577 ml   Filed Weights   01/14/17 0630 01/15/17 0623 01/16/17 0642  Weight: 85.6 kg (188 lb 11.4 oz) 84.4 kg (186 lb 1.6 oz) 84.7 kg (186 lb 12.8 oz)    Examination: General exam: Appears comfortable  HEENT: PERRLA, oral mucosa moist, no sclera icterus or thrush Respiratory system: Clear to auscultation. Respiratory effort normal. Cardiovascular system: S1 & S2 heard,  No murmurs  Gastrointestinal system: Abdomen soft, non-tender, nondistended. Normal bowel sound. No organomegaly Central nervous system: Alert and oriented. Left sided weakness Extremities: No cyanosis, clubbing or edema Skin: No rashes or ulcers Psychiatry:  flat affect     Data Reviewed: I have personally reviewed following labs and imaging studies  CBC: Recent Labs  Lab 01/11/17 0615 01/11/17 1340 01/12/17 1243 01/14/17 0506 01/15/17 0657  WBC 15.5* 15.2* 20.4* 17.3* 16.8*  HGB 10.6* 10.2* 10.9* 9.2* 8.7*  HCT 31.8* 30.8* 32.9* 27.9* 27.7*  MCV 86.4 86.8 86.1 87.7 89.9  PLT 410* 411* 357 224 262   Basic Metabolic Panel: Recent Labs  Lab 01/11/17 0615 01/12/17 0618 01/13/17 0551 01/14/17 0506 01/15/17 0657 01/16/17 0644  NA 132* 134* 134* 133*  133* 136 136  K 6.1* 4.4 4.0 3.5  3.5 3.7 3.6  CL 100* 99* 97* 96*  96* 98* 99*  CO2 18* _0 GLUCOSE 209* 206* 137* 124*  124* 127* 127*  BUN 132* 108* 69* 80*  82* 54* 63*  CREATININE 6.34* 5.21* 3.94* 4.33*  4.38* 3.54* 4.02*  CALCIUM 7.2* 7.6* 7.2* 6.9*  6.9* 6.9* 6.7*  PHOS 7.3* 6.5*  --  4.8*  4.8* 3.8 4.0   GFR: Estimated Creatinine Clearance: 14.8 mL/min (A) (by C-G formula based on SCr of 4.02 mg/dL (H)). Liver Function Tests: Recent Labs  Lab 01/11/17 0615 01/12/17 0618 01/14/17 0506 01/15/17 0657 01/16/17 0644  ALBUMIN 3.2* 2.9* 2.6*  2.5* 2.6* 2.5*   No results for input(s): LIPASE, AMYLASE in the last 168 hours. No results for input(s): AMMONIA in the last 168 hours. Coagulation Profile: Recent Labs  Lab 01/13/17 1142  INR 1.05   Cardiac Enzymes: No results for input(s): CKTOTAL, CKMB, CKMBINDEX, TROPONINI in the last 168 hours. BNP (last 3 results) No results for input(s): PROBNP  in the last 8760 hours. HbA1C: No results for input(s): HGBA1C in the last 72 hours. CBG: Recent Labs  Lab 01/15/17 0740 01/15/17 1123 01/15/17 1636 01/15/17 2049 01/16/17 0730  GLUCAP 120* 268* 151* 195* 121*   Lipid Profile: No results for input(s): CHOL, HDL, LDLCALC, TRIG, CHOLHDL, LDLDIRECT in the last 72 hours. Thyroid Function Tests: No results for input(s): TSH, T4TOTAL, FREET4, T3FREE, THYROIDAB in the last 72 hours. Anemia Panel: No results for input(s): VITAMINB12, FOLATE, FERRITIN, TIBC, IRON, RETICCTPCT in the last 72 hours. Urine analysis:    Component Value Date/Time   COLORURINE YELLOW 01/10/2017 1150   APPEARANCEUR CLOUDY (A) 01/10/2017 1150   LABSPEC 1.011 01/10/2017 1150   PHURINE 7.0 01/10/2017 1150   GLUCOSEU NEGATIVE 01/10/2017 1150   HGBUR NEGATIVE 01/10/2017 1150   BILIRUBINUR NEGATIVE 01/10/2017 1150   KETONESUR NEGATIVE 01/10/2017 1150   PROTEINUR 100 (A) 01/10/2017 1150   UROBILINOGEN 0.2 04/25/2014 2315   NITRITE NEGATIVE 01/10/2017 1150   LEUKOCYTESUR MODERATE (A) 01/10/2017 1150   Sepsis  Labs: '@LABRCNTIP'$ (procalcitonin:4,lacticidven:4) )No results found for this or any previous visit (from the past 240 hour(s)).       Radiology Studies: Ir Fluoro Guide Cv Line Left  Result Date: 01/14/2017 INDICATION: End-stage renal disease, in need of durable intravenous access for continuation of dialysis. EXAM: TUNNELED CENTRAL VENOUS HEMODIALYSIS CATHETER PLACEMENT WITH ULTRASOUND AND FLUOROSCOPIC GUIDANCE MEDICATIONS: Ancef 2 gm IV . The antibiotic was given in an appropriate time interval prior to skin puncture. ANESTHESIA/SEDATION: Versed 2 mg IV; Fentanyl 50 mcg IV; Moderate Sedation Time:  16 minutes The patient was continuously monitored during the procedure by the interventional radiology nurse under my direct supervision. FLUOROSCOPY TIME:  Fluoroscopy Time: 36 seconds (5 mGy). COMPLICATIONS: None immediate. PROCEDURE: Informed written consent was obtained from the patient after a discussion of the risks, benefits, and alternatives to treatment. Questions regarding the procedure were encouraged and answered. The left neck and chest were prepped with chlorhexidine in a sterile fashion, and a sterile drape was applied covering the operative field. Maximum barrier sterile technique with sterile gowns and gloves were used for the procedure. A timeout was performed prior to the initiation of the procedure. After creating a small venotomy incision, a micropuncture kit was utilized to access the internal jugular vein. Real-time ultrasound guidance was utilized for vascular access including the acquisition of a permanent ultrasound image documenting patency of the accessed vessel. The microwire was utilized to measure appropriate catheter length. A stiff Glidewire was advanced to the level of the IVC and the micropuncture sheath was exchanged for a peel-away sheath. A palindrome tunneled hemodialysis catheter measuring 23 cm from tip to cuff was tunneled in a retrograde fashion from the anterior  chest wall to the venotomy incision. The catheter was then placed through the peel-away sheath with tips ultimately positioned within the superior aspect of the right atrium. Final catheter positioning was confirmed and documented with a spot radiographic image. The catheter aspirates and flushes normally. The catheter was flushed with appropriate volume heparin dwells. The catheter exit site was secured with a 0-Prolene retention suture. The venotomy incision was closed with an interrupted 4-0 Vicryl, Dermabond and Steri-strips. Dressings were applied. The patient tolerated the procedure well without immediate post procedural complication. IMPRESSION: Successful placement of 23 cm tip to cuff tunneled hemodialysis catheter via the left internal jugular vein with tips terminating within the superior aspect of the right atrium. The catheter is ready for immediate use. Electronically Signed  By: Sandi Mariscal M.D.   On: 01/14/2017 15:28   Ir US Guide Vasc Access Left  Result Date: 01/14/2017 INDICATION: End-stage renal disease, in need of durable intravenous access for continuation of dialysis. EXAM: TUNNELED CENTRAL VENOUS HEMODIALYSIS CATHETER PLACEMENT WITH ULTRASOUND AND FLUOROSCOPIC GUIDANCE MEDICATIONS: Ancef 2 gm IV . The antibiotic was given in an appropriate time interval prior to skin puncture. ANESTHESIA/SEDATION: Versed 2 mg IV; Fentanyl 50 mcg IV; Moderate Sedation Time:  16 minutes The patient was continuously monitored during the procedure by the interventional radiology nurse under my direct supervision. FLUOROSCOPY TIME:  Fluoroscopy Time: 36 seconds (5 mGy). COMPLICATIONS: None immediate. PROCEDURE: Informed written consent was obtained from the patient after a discussion of the risks, benefits, and alternatives to treatment. Questions regarding the procedure were encouraged and answered. The left neck and chest were prepped with chlorhexidine in a sterile fashion, and a sterile drape was applied  covering the operative field. Maximum barrier sterile technique with sterile gowns and gloves were used for the procedure. A timeout was performed prior to the initiation of the procedure. After creating a small venotomy incision, a micropuncture kit was utilized to access the internal jugular vein. Real-time ultrasound guidance was utilized for vascular access including the acquisition of a permanent ultrasound image documenting patency of the accessed vessel. The microwire was utilized to measure appropriate catheter length. A stiff Glidewire was advanced to the level of the IVC and the micropuncture sheath was exchanged for a peel-away sheath. A palindrome tunneled hemodialysis catheter measuring 23 cm from tip to cuff was tunneled in a retrograde fashion from the anterior chest wall to the venotomy incision. The catheter was then placed through the peel-away sheath with tips ultimately positioned within the superior aspect of the right atrium. Final catheter positioning was confirmed and documented with a spot radiographic image. The catheter aspirates and flushes normally. The catheter was flushed with appropriate volume heparin dwells. The catheter exit site was secured with a 0-Prolene retention suture. The venotomy incision was closed with an interrupted 4-0 Vicryl, Dermabond and Steri-strips. Dressings were applied. The patient tolerated the procedure well without immediate post procedural complication. IMPRESSION: Successful placement of 23 cm tip to cuff tunneled hemodialysis catheter via the left internal jugular vein with tips terminating within the superior aspect of the right atrium. The catheter is ready for immediate use. Electronically Signed   By: Sandi Mariscal M.D.   On: 01/14/2017 15:28      Scheduled Meds: . aspirin EC  81 mg Oral Daily  . atorvastatin  40 mg Oral q1800  . budesonide (PULMICORT) nebulizer solution  0.5 mg Nebulization BID  . calcitRIOL  0.25 mcg Oral Daily  . calcium  acetate  667 mg Oral TID WC  . enoxaparin (LOVENOX) injection  30 mg Subcutaneous Q24H  . hydrALAZINE  50 mg Oral TID  . insulin aspart  0-15 Units Subcutaneous TID WC  . insulin aspart  0-5 Units Subcutaneous QHS  . ipratropium-albuterol  3 mL Nebulization TID  . loratadine  10 mg Oral Daily  . mouth rinse  15 mL Mouth Rinse BID  . metoprolol succinate  50 mg Oral Daily  . predniSONE  5 mg Oral Q breakfast  . sodium chloride flush  3 mL Intravenous Q12H  . venlafaxine XR  150 mg Oral Q breakfast   Continuous Infusions: . sodium chloride    . sodium chloride    . sodium chloride    . sodium chloride    .  sodium chloride    . sodium chloride       LOS: 10 days    Time spent in minutes: 35    Debbe Odea, MD [facility] Pager: [email or web address] Password [email or web address] 01/16/2017, 9:52 AM

## 2017-01-16 NOTE — Progress Notes (Signed)
Physical Therapy Treatment Patient Details Name: RENNY GUNNARSON MRN: [id number] DOB: [birth year] Today's Date: 01/16/2017    History of Present Illness 66 yo female with onset of SOB and weakness was admitted, has notable L hemi presentation with chronic O2 use with low CO2, low functional level at baseline.  PMHx:  stroke, CKD, CHF, DM, HTN, L humeral fracture, carotid occlusion, a-fib, low CO2.    PT Comments    RHETT NAJERA is a 66 y.o. presenting for PT evaluation with recent decrease in functional mobility secondary to above diagnosis. At baseline she is mobilizes with a wheelchair and requires assistance from her husband. Currently she requires mod assist for bed mobility with HOB elevated and max for transfers. She will benefit from skilled PT to address current impairments and from follow up PT at below inpatient venue to address mobility and improve functional independence. Acute PT will follow.    Follow Up Recommendations  SNF     Equipment Recommendations  None recommended by PT    Recommendations for Other Services OT consult     Precautions / Restrictions Precautions Precautions: Fall Restrictions Weight Bearing Restrictions: No    Mobility  Bed Mobility Overal bed mobility: Needs Assistance Bed Mobility: Rolling;Supine to Sit Rolling: Min assist;Mod assist   Supine to sit: Mod assist;Min assist;HOB elevated     General bed mobility comments: patient with goo duse fo RLE to assist LLE movement, unable to move her LUE, verbal cues for safe sequencing  Transfers    General transfer comment: patient declined to participate in advanced transfers due to fatigue  Ambulation/Gait    General Gait Details: patient declined to participate, non-ambulatory at baseline   Architect Wheelchair mobility: (wheelchair user at baseline)  Modified Rankin (Stroke Patients Only)       Balance Overall balance assessment: Needs  assistance Sitting-balance support: Feet supported;Single extremity supported Sitting balance-Leahy Scale: Fair   Postural control: Posterior lean;Right lateral lean    Cognition Arousal/Alertness: Awake/alert Behavior During Therapy: WFL for tasks assessed/performed Overall Cognitive Status: Within Functional Limits for tasks assessed       Exercises General Exercises - Lower Extremity Ankle Circles/Pumps: AROM;Right;10 reps;Strengthening;Seated Long Arc Quad: AROM;Strengthening;Right;10 reps;Seated Toe Raises: PROM;Left;5 reps;Limitations;Seated Toe Raises Limitations: patient using towel with RUE to provide stretch and PROM to LLE Correct positioning of sling/immobilizer: (asked patient husband to bring sling in for LUE to support shuolder as patient is complaining of glenohumeral pain)    General Comments General comments (skin integrity, edema, etc.): patient LUE bruises easily, be careful with places pressure along arm      Pertinent Vitals/Pain Pain Assessment: Faces Pain Score: 4  Faces Pain Scale: Hurts a little bit Pain Location: left shoulder Pain Descriptors / Indicators: Aching;Sore;Dull Pain Intervention(s): Limited activity within patient's tolerance;Monitored during session           PT Goals (current goals can now be found in the care plan section) Acute Rehab PT Goals Patient Stated Goal: to get straight home PT Goal Formulation: With patient/family Time For Goal Achievement: 01/23/17 Potential to Achieve Goals: Good Progress towards PT goals: Progressing toward goals    Frequency    Min 3X/week      PT Plan Current plan remains appropriate       AM-PAC PT "6 Clicks" Daily Activity  Outcome Measure  Difficulty turning over in bed (including adjusting bedclothes, sheets and blankets)?: Unable Difficulty moving from lying on back to sitting on the side of  the bed? : Unable Difficulty sitting down on and standing up from a chair with arms  (e.g., wheelchair, bedside commode, etc,.)?: Unable Help needed moving to and from a bed to chair (including a wheelchair)?: Total Help needed walking in hospital room?: Total Help needed climbing 3-5 steps with a railing? : Total 6 Click Score: 6    End of Session Equipment Utilized During Treatment: Oxygen Activity Tolerance: Patient limited by fatigue;Patient tolerated treatment well Patient left: in bed;with call bell/phone within reach;with bed alarm set;with family/visitor present(seated at edge of bed with tray in front) Nurse Communication: Mobility status PT Visit Diagnosis: Unsteadiness on feet (R26.81);Muscle weakness (generalized) (M62.81);Difficulty in walking, not elsewhere classified (R26.2);Hemiplegia and hemiparesis Hemiplegia - Right/Left: Left Hemiplegia - dominant/non-dominant: Non-dominant Hemiplegia - caused by: Unspecified     Time: 1030-1051 PT Time Calculation (min) (ACUTE ONLY): 21 min  Charges:  $Therapeutic Activity: 8-22 mins                    G Codes:  Functional Assessment Tool Used: AM-PAC 6 Clicks Basic Mobility    Debara Pickett, PT, DPT Physical Therapist with [facility]  01/16/2017 11:34 AM

## 2017-01-17 DIAGNOSIS — I639 Cerebral infarction, unspecified: Secondary | ICD-10-CM

## 2017-01-17 DIAGNOSIS — I10 Essential (primary) hypertension: Secondary | ICD-10-CM

## 2017-01-17 LAB — CBC
HCT: 23.5 % — ABNORMAL LOW (ref 36.0–46.0)
Hemoglobin: 7.4 g/dL — ABNORMAL LOW (ref 12.0–15.0)
MCH: 28.4 pg (ref 26.0–34.0)
MCHC: 31.5 g/dL (ref 30.0–36.0)
MCV: 90 fL (ref 78.0–100.0)
PLATELETS: 205 10*3/uL (ref 150–400)
RBC: 2.61 MIL/uL — AB (ref 3.87–5.11)
RDW: 13.9 % (ref 11.5–15.5)
WBC: 12.6 10*3/uL — AB (ref 4.0–10.5)

## 2017-01-17 LAB — GLUCOSE, CAPILLARY
GLUCOSE-CAPILLARY: 125 mg/dL — AB (ref 65–99)
GLUCOSE-CAPILLARY: 137 mg/dL — AB (ref 65–99)
Glucose-Capillary: 129 mg/dL — ABNORMAL HIGH (ref 65–99)
Glucose-Capillary: 133 mg/dL — ABNORMAL HIGH (ref 65–99)

## 2017-01-17 MED ORDER — ONDANSETRON HCL 4 MG/2ML IJ SOLN
4.0000 mg | Freq: Once | INTRAMUSCULAR | Status: AC
  Administered 2017-01-17: 4 mg via INTRAVENOUS
  Filled 2017-01-17: qty 2

## 2017-01-17 MED ORDER — ACETAMINOPHEN 325 MG PO TABS
650.0000 mg | ORAL_TABLET | ORAL | Status: DC | PRN
  Administered 2017-01-22 – 2017-01-27 (×2): 650 mg via ORAL
  Filled 2017-01-17 (×2): qty 2

## 2017-01-17 MED ORDER — SODIUM CHLORIDE 0.9 % IV SOLN: 100.0000 mL | INTRAVENOUS | Status: DC | PRN

## 2017-01-17 NOTE — Progress Notes (Signed)
PROGRESS NOTE    Kelli [name]   YOV:[id number]  DOB: 07-May-1950  DOA: 01/06/2017 PCP: Manon Hilding, MD   Brief Narrative:  Kelli [name] 66 year old female with a history of stroke with residual left hemiparesis, CKD stage III, essential hypertension, diabetes mellitus, peripheral vascular disease presentedwith 1 day history of shortness of breath, worsening leg edema, and chest discomfort. The patient also complains of a cough with clear sputum. She had subjective fevers and chills, but denied any headache, neck pain.The patient has chronic nausea and vomiting each morning which has been unchanged.  She complains of intermittent abdominal pain with loose stools. There is no dysuria, hematuria.U In ER: Hb 7.6. Chest x-ray showed pulmonary edema with BNP 966. The patientwas started on IV Lasix but remained fluid overloaded and her renal function continued to worsen. Repeat chest x-ray revealed pulmonary edema. Nephrology was consulted to assist with management and possible initiation of dialysis.  Dr. Constance Haw placed VasCath on 01/11/17.   Subjective: No CP, no SOB. Reports some abd discomfort; which improved with antiemetic and GI cocktail. Tolerating HD.  Assessment & Plan: Acute on chronic renal failure/acute on chronic diastolic CHF - now ESRD dependent  - appreciate management per nephrology service - 11/16 permanent tunneled dialysis cath placed by IR - not interested in palliative care and will like to pursuit full code. -will continue HD for volume control -continue toprol and hydralazine -follow low sodium diet and daily weights -will discharge to SNF for rehabilitation.   acute hypoxic resp failure - fluid overload and COPD -improved -continue HD now for volume management -completed steroids tapering -continue PRN nebulizer.  Acute wheezing- no prior h/o COPD -improved -no further wheezing appreciated -patient has completed steroids tapering  -will  continue PRN nebulizer treatment only  Leukocytosis -due to steroids most likely -no signs of infection -trending down appropriately.  Metabolic acidosis -on sodium bicarb tablets -will discontinue once stabilizes on HD.    HTN (hypertension) - continue hydralazine and toprol -HD will also help controlling BP and volume.    Elevated troponin - most likely associated with CKD -patient denying CP and SOB  Sinus arrhythmia with occasional short runs of SVT - she has a loop recorded which was placed after a CVA in 2/15 - one short episode of SVT yesterday and one on 11/15 - d/c TID nebulizer and minimize use of albuterol - Dr Domenic Polite aware of the first episode - no further rec's currently -folllow electrolytes closely and continue b-blocker.    Symptomatic anemia - hemolysis labs negative - likely due to chronic disease - Hb 7.4 -received 2 u PRBC on 11/8  -will benefit of IV iron and Epogen -will follow renal service recommendations -no signs of acute bleeding     Diabetes mellitus type 2 in obese  - continue SSI     Hx of Stroke (cryptogenic)with left hemiparesis  - as mentioned, has a loop recorder in place - will continue ASA - continue statins  - in agreement for SNF; but will like to discussed with husband.    Mixed hyperlipidemia -continue statin   Severe fatigue/ depression with flat affect -patient agrees to discussed with her husband and caregiver for final decision -understand short term rehabilitation at SNF is the best and safest discharge plans.  Occasional leg pain -stable and no major complaints today -will continue PRN Ibuprofen and Tramadol   DVT prophylaxis: Lovenox Code Status: Full code Family Communication: sister at bedside Disposition Plan: continue HD while  inpatient today; CSW to assist with SNF placement; waiting final inputs from outpatient clipping process.  Consultants:   nephro  Palliative care  IR  Procedures:   HD  cath by surgery  Tunneled dialysis cath per IR  2 D ECHO Left ventricle: The cavity size was normal. Systolic function was   normal. The estimated ejection fraction was in the range of 55%   to 60%. Wall motion was normal; there were no regional wall   motion abnormalities. The study is not technically sufficient to   allow evaluation of LV diastolic function due to tachycardia and   E/a fusion. Moderate to severe left ventricular hypertrophy. - Aortic valve: Trileaflet; mildly thickened, mildly calcified   leaflets. Sclerosis without stenosis. There was no stenosis. - Mitral valve: Calcified annulus. - Left atrium: The atrium was mildly dilated. - Atrial septum: No defect or patent foramen ovale was identified.  Antimicrobials:  Anti-infectives (From admission, onward)   Start     Dose/Rate Route Frequency Ordered Stop   01/14/17 1415  ceFAZolin (ANCEF) IVPB 2g/100 mL premix     2 g 200 mL/hr over 30 Minutes Intravenous To Radiology 01/14/17 1413 01/14/17 1455      Objective: Vitals:   01/17/17 1330 01/17/17 1400 01/17/17 2048 01/17/17 2118  BP: (!) 127/58 128/69  (!) 119/57  Pulse: 73 90  81  Resp: 18 20  20   Temp: 97.6 F (36.4 C) 98.1 F (36.7 C)  98.5 F (36.9 C)  TempSrc: Oral Oral  Oral  SpO2:  98% 97% 100%  Weight:      Height:        Intake/Output Summary (Last 24 hours) at 01/17/2017 2210 Last data filed at 01/17/2017 1900 Gross per 24 hour  Intake 810 ml  Output 1700 ml  Net -890 ml   Filed Weights   01/16/17 0642 01/17/17 0457 01/17/17 0900  Weight: 84.7 kg (186 lb 12.8 oz) 85.3 kg (188 lb) 85.3 kg (188 lb 0.8 oz)   Examination: General exam: afebrile, in no acute distress, denies CP and SOB. Tolerating HD. Respiratory system: good air movement, no wheezing, no crackles Cardiovascular system: S1 and s2, no rubs, no gallops Gastrointestinal system: soft, NT, ND, positive BS Central nervous system: AAO3, CN intact, some left side weakness  appreciated, moving four limbs spontaneously.  Extremities: no clubbing or cyanosis, trace edema bilaterally appreciated on exam. Psychiatry:  flat affect, no conversing much; denies SI and hallucinations. Following commands properly.  Data Reviewed: I have personally reviewed following labs and imaging studies  CBC: Recent Labs  Lab 01/11/17 1340 01/12/17 1243 01/14/17 0506 01/15/17 0657 01/17/17 0900  WBC 15.2* 20.4* 17.3* 16.8* 12.6*  HGB 10.2* 10.9* 9.2* 8.7* 7.4*  HCT 30.8* 32.9* 27.9* 27.7* 23.5*  MCV 86.8 86.1 87.7 89.9 90.0  PLT 411* 357 224 213 301   Basic Metabolic Panel: Recent Labs  Lab 01/11/17 0615 01/12/17 0618 01/13/17 0551 01/14/17 0506 01/15/17 0657 01/16/17 0644  NA 132* 134* 134* 133*  133* 136 136  K 6.1* 4.4 4.0 3.5  3.5 3.7 3.6  CL 100* 99* 97* 96*  96* 98* 99*  CO2 18* 22 25 26  26 26 27   GLUCOSE 209* 206* 137* 124*  124* 127* 127*  BUN 132* 108* 69* 80*  82* 54* 63*  CREATININE 6.34* 5.21* 3.94* 4.33*  4.38* 3.54* 4.02*  CALCIUM 7.2* 7.6* 7.2* 6.9*  6.9* 6.9* 6.7*  PHOS 7.3* 6.5*  --  4.8*  4.8* 3.8  4.0   GFR: Estimated Creatinine Clearance: 14.8 mL/min (A) (by C-G formula based on SCr of 4.02 mg/dL (H)).   Liver Function Tests: Recent Labs  Lab 01/11/17 0615 01/12/17 0618 01/14/17 0506 01/15/17 0657 01/16/17 0644  ALBUMIN 3.2* 2.9* 2.6*  2.5* 2.6* 2.5*   Coagulation Profile: Recent Labs  Lab 01/13/17 1142  INR 1.05   CBG: Recent Labs  Lab 01/16/17 2118 01/17/17 0728 01/17/17 1129 01/17/17 1640 01/17/17 2124  GLUCAP 158* 125* 129* 133* 137*   Urine analysis:    Component Value Date/Time   COLORURINE YELLOW 01/10/2017 1150   APPEARANCEUR CLOUDY (A) 01/10/2017 1150   LABSPEC 1.011 01/10/2017 1150   PHURINE 7.0 01/10/2017 1150   GLUCOSEU NEGATIVE 01/10/2017 1150   HGBUR NEGATIVE 01/10/2017 1150   BILIRUBINUR NEGATIVE 01/10/2017 1150   KETONESUR NEGATIVE 01/10/2017 1150   PROTEINUR 100 (A) 01/10/2017 1150    UROBILINOGEN 0.2 04/25/2014 2315   NITRITE NEGATIVE 01/10/2017 1150   LEUKOCYTESUR MODERATE (A) 01/10/2017 1150    Radiology Studies: No results found.    Scheduled Meds: . aspirin EC  81 mg Oral Daily  . atorvastatin  40 mg Oral q1800  . budesonide (PULMICORT) nebulizer solution  0.5 mg Nebulization BID  . calcitRIOL  0.25 mcg Oral Daily  . calcium acetate  667 mg Oral TID WC  . enoxaparin (LOVENOX) injection  30 mg Subcutaneous Q24H  . hydrALAZINE  50 mg Oral TID  . insulin aspart  0-15 Units Subcutaneous TID WC  . insulin aspart  0-5 Units Subcutaneous QHS  . loratadine  10 mg Oral Daily  . mouth rinse  15 mL Mouth Rinse BID  . metoprolol succinate  50 mg Oral Daily  . venlafaxine XR  150 mg Oral Q breakfast   Continuous Infusions: . sodium chloride    . sodium chloride    . sodium chloride       LOS: 11 days    Time spent in minutes: 25 minutes    Barton Dubois, MD [facility] Pager: [phone] [email or web address] Password [email or web address] 01/17/2017, 10:10 PM

## 2017-01-17 NOTE — Care Management Important Message (Signed)
Important Message  Patient Details  Name: Kelli [name] MRN: [id number] Date of Birth: 1950/08/04   Medicare Important Message Given:  Yes    Sherald Barge, RN 01/17/2017, 1:13 PM

## 2017-01-17 NOTE — Progress Notes (Addendum)
Subjective: Interval History: Patient is feeling better.  Her appetite is improving.  She did not have any nausea or vomiting.  Objective: Vital signs in last 24 hours: Temp:  [97.6 F (36.4 C)-98.7 F (37.1 C)] 97.6 F (36.4 C) (11/19 0457) Pulse Rate:  [81-86] 86 (11/19 0457) Resp:  [17-18] 17 (11/19 0457) BP: (124-139)/(59-67) 139/67 (11/19 0457) SpO2:  [96 %-100 %] 96 % (11/19 0756) Weight:  [85.3 kg (188 lb)] 85.3 kg (188 lb) (11/19 0457) Weight change: 0.544 kg (1 lb 3.2 oz)  Intake/Output from previous day: 11/18 0701 - 11/19 0700 In: 360 [P.O.:360] Out: 800 [Urine:800] Intake/Output this shift: No intake/output data recorded.  Patient on dialysis Chest is clear to auscultation Heart exam irevealed regular rate and rhythm no murmur Abdomen: Positive bowel sounds Extremities no edema   Lab Results: Recent Labs    01/15/17 0657  WBC 16.8*  HGB 8.7*  HCT 27.7*  PLT 213   BMET:  Recent Labs    01/15/17 0657 01/16/17 0644  NA 136 136  K 3.7 3.6  CL 98* 99*  CO2 26 27  GLUCOSE 127* 127*  BUN 54* 63*  CREATININE 3.54* 4.02*  CALCIUM 6.9* 6.7*   No results for input(s): PTH in the last 72 hours. Iron Studies: No results for input(s): IRON, TIBC, TRANSFERRIN, FERRITIN in the last 72 hours.  Studies/Results: No results found.  I have reviewed the patient's current medications.  Assessment/Plan: Problem #1 renal failure: Patient on dialysis.  S/P Hemodialysis on Friday.  Patient is due for dialysis today.  Presently her appetite is improving. 2] hyperkalemia: her potassium is normal 3] history of CHF: No sign of fluid overload.  Patient is still making urine.  She had 800 cc of urine output.. 4] bone and mineral disorder: Her calcium  and phosphorus is in  range.  Patient is on Pholo  binder. 5] anemia:.  Her hemoglobin how ever remains below our target goal but stable 6] hypertension: Her blood pressure is reasonably controlled Plan:1] will make  arrangement for patient to get dialysis today for 4 hours. 2] I will follow patient once an outpatient arrangement was [facility] is made. 3]CBC renal panel in am     LOS: 11 days   Kelli [name] S 01/17/2017,8:31 AM

## 2017-01-17 NOTE — Care Management (Addendum)
CM discussed DC plan with Pt, sister (caregiver) and MD at bedside. Pt ready for DC today. Sister and MD urge pt that she needs to be able to participate more in her care before returning home. Pt unwilling to make final decision until she speaks with her husband. CM called pt's husband Kelli [name]) and discussed recommendations and that pt wants to speak to him before making final decision. Kelli [name] agrees Kelli [name] needs STR and he would like for CSW to fax pt info out to area facilities, he will discuss further with pt when he gets here at lunch. CM discussed with CSW.

## 2017-01-17 NOTE — Progress Notes (Signed)
Daily Progress Note   Patient Name: Kelli [name]       Date: 01/17/2017 DOB: [birth year]  Age: 66 y.o. MRN#: [id number] Attending Physician: Barton Dubois, MD Primary Care Physician: Manon Hilding, MD Admit Date: 01/06/2017  Reason for Consultation/Follow-up: Establishing goals of care and Psychosocial/spiritual support  Subjective: [name] is resting quietly in bed with her eyes closed, receiving HD treatments.  Her sister, Amy, is present at bedside.  [name] opens her eyes when I call her name.  I asked how she is doing, she replies "tired".  I encouraged her to do the best she can.  We talked about the tunneled HD catheter, and Amy shares that the temporary cath has been removed. Discussion with HD nurse related to care.  Discussion with social worker related to placement.  Kelli [name] should discharge to SNF rehab today.  Length of Stay: 11  Current Medications: Scheduled Meds:  . aspirin EC  81 mg Oral Daily  . atorvastatin  40 mg Oral q1800  . budesonide (PULMICORT) nebulizer solution  0.5 mg Nebulization BID  . calcitRIOL  0.25 mcg Oral Daily  . calcium acetate  667 mg Oral TID WC  . enoxaparin (LOVENOX) injection  30 mg Subcutaneous Q24H  . hydrALAZINE  50 mg Oral TID  . insulin aspart  0-15 Units Subcutaneous TID WC  . insulin aspart  0-5 Units Subcutaneous QHS  . loratadine  10 mg Oral Daily  . mouth rinse  15 mL Mouth Rinse BID  . metoprolol succinate  50 mg Oral Daily  . venlafaxine XR  150 mg Oral Q breakfast    Continuous Infusions: . sodium chloride    . sodium chloride    . sodium chloride      PRN Meds: sodium chloride, sodium chloride, acetaminophen, alteplase, alum & mag hydroxide-simeth, heparin, heparin, ibuprofen, ipratropium-albuterol, lidocaine  (PF), lidocaine-prilocaine, pentafluoroprop-tetrafluoroeth, traMADol  Physical Exam  Constitutional:  Resting with eyes closed, opens eyes when I call her name.  Only briefly makes eye contact  HENT:  Head: Atraumatic.  Cardiovascular: Normal rate.  Pulmonary/Chest: Effort normal. No respiratory distress.  Abdominal: Soft. She exhibits no distension.  Neurological: She is alert.  Psychiatric: Her mood appears not anxious.  Nursing note and vitals reviewed.  Vital Signs: BP (!) 127/58 (BP Location: Left Arm)   Pulse 73   Temp 97.6 F (36.4 C) (Oral)   Resp 18   Ht 5\' 5"  (1.651 m)   Wt 85.3 kg (188 lb 0.8 oz)   SpO2 96%   BMI 31.29 kg/m  SpO2: SpO2: 96 % O2 Device: O2 Device: Nasal Cannula O2 Flow Rate: O2 Flow Rate (L/min): 1 L/min  Intake/output summary:   Intake/Output Summary (Last 24 hours) at 01/17/2017 1408 Last data filed at 01/17/2017 1320 Gross per 24 hour  Intake 600 ml  Output 2300 ml  Net -1700 ml   LBM: Last BM Date: 01/16/17 Baseline Weight: Weight: 83.5 kg (184 lb) Most recent weight: Weight: 85.3 kg (188 lb 0.8 oz)       Palliative Assessment/Data:    Flowsheet Rows     Most Recent Value  Intake Tab  Referral Department  Hospitalist  Unit at Time of Referral  Cardiac/Telemetry Unit  Palliative Care Primary Diagnosis  Nephrology  Date Notified  01/09/17  Palliative Care Type  New Palliative care  Reason for referral  Clarify Goals of Care  Date of Admission  01/06/17  Date first seen by Palliative Care  01/10/17  # of days Palliative referral response time  1 Day(s)  # of days IP prior to Palliative referral  3  Clinical Assessment  Palliative Performance Scale Score  30%  Pain Max last 24 hours  Not able to report  Pain Min Last 24 hours  Not able to report  Dyspnea Max Last 24 Hours  Not able to report  Dyspnea Min Last 24 hours  Not able to report  Psychosocial & Spiritual Assessment  Palliative Care Outcomes    Patient/Family meeting held?  Yes  Who was at the meeting?  Patient and Sister Alphonse Guild at bedside  [place] goals of care, Provided psychosocial or spiritual support      Patient Active Problem List   Diagnosis Date Noted  . Palliative care encounter   . Goals of care, counseling/discussion   . DNR (do not resuscitate) discussion   . Acute renal failure superimposed on stage 4 chronic kidney disease ([place]) 01/09/2017  . Precordial pain   . CKD (chronic kidney disease), stage IV ([place]) 01/07/2017  . Acute respiratory failure with hypoxia ([place]) 01/07/2017  . Symptomatic anemia 01/06/2017  . Diabetes mellitus type 2 in obese ([place]) 01/06/2017  . B12 deficiency 11/18/2016  . Anemia in chronic kidney disease 11/18/2016  . PUD (peptic ulcer disease) 10/13/2016  . Pacemaker 10/13/2016  . Acute on chronic diastolic CHF (congestive heart failure) ([place]) 10/13/2016  . Elevated troponin   . Hypovolemic shock ([place]) 04/26/2014  . Upper GI bleed   . Leukocytosis, unspecified 07/30/2013  . Thrombocytosis ([place]) 07/30/2013  . Subluxation of left shoulder joint 07/27/2013  . Closed dislocation of left humerus 07/26/2013  . Pyelonephritis 07/25/2013  . Acute on chronic renal failure ([place]) 07/25/2013  . Normocytic anemia 07/25/2013  . Hyponatremia 07/25/2013  . Spastic hemiplegia affecting nondominant side ([place]) 07/06/2013  . 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 UTI (urinary tract infection) 06/15/2013  . Edema 05/28/2013  . Knee pain 05/28/2013  . Carotid stenosis, bilateral 05/22/2013  . Fall at nursing home 05/20/2013  . Hip pain 05/20/2013  . Pain in joint, lower leg 05/19/2013  . Chest congestion 05/12/2013  . E. coli UTI 05/07/2013  . Left shoulder pain due to  subluxation and/or adhesive capsulitis.  05/07/2013  . Acute CVA (cerebrovascular accident): R PCA infarct per MRI 04/13/13 04/13/2013  . CVA (cerebral  infarction) 04/08/2013  . Acute ischemic stroke ([place]) 04/08/2013  . Diplopia 04/08/2013  . Left-sided weakness 04/08/2013  . Vertigo 04/08/2013  . Paresthesias in left hand 04/08/2013  . Cerebellar stroke ([place]) 04/08/2013  . Dizziness and giddiness 04/08/2013  . Stroke ([place]) 04/08/2013  . Depression 04/06/2013  . Peripheral arterial disease ([place]) 03/16/2013  . History of stroke 01/30/2013  . Anxiety state, unspecified 12/08/2012  . Lumbago 12/08/2012  . Urinary frequency 11/14/2012  . Occlusion and stenosis of carotid artery with cerebral infarction 10/05/2012  . Mixed hyperlipidemia 10/05/2012  . Renal insufficiency 10/05/2012  . HTN (hypertension) 10/04/2012  . Tobacco abuse 10/04/2012  . 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  Assessment: heart failure, worsening kidney disease:Mrs. Plouff is scheduled for emergency hemodialysis catheter placement today with emergent dialysis of 2 hours 11/13, she also had 2 hours dialysis 11/14, and is scheduled for 4-hour dialysis 11/15 along with her tunneled cath.  Had Tunneled HD cath, patent and using, temp cath removed.   Recommendations/Plan:  SNF rehab  Goals of Care and Additional Recommendations:  Limitations on Scope of Treatment: Full Scope Treatment  Code Status:    Code Status Orders  (From admission, onward)        Start     Ordered   01/06/17 2015  Full code  Continuous     01/06/17 2014    Code Status History    Date Active Date Inactive Code Status Order ID Comments User Context   10/13/2016 06:39 10/20/2016 22:33 Full  Code [id number]  Phillips Grout, MD Inpatient   04/26/2014 02:32 04/30/2014 23:09 Full Code [id number]  Shanda Howells, MD ED   07/25/2013 12:18 07/31/2013 19:08 Full Code [id number]  Radene Gunning, NP Inpatient   04/17/2013 17:59 05/08/2013 14:03 Full Code [id number]  Bary Leriche, PA-C Inpatient   04/17/2013 17:59 04/17/2013 17:59 Full Code [id number]  Bary Leriche, PA-C Inpatient   04/16/2013 12:07 04/17/2013 17:59 Full Code [id number]  Evans Lance, MD Inpatient   04/08/2013 20:54 04/16/2013 12:07 Full Code [id number]  Bynum Bellows, MD ED   01/16/2013 23:35 01/18/2013 19:19 Full Code [id number]  Phillips Grout, MD Inpatient   10/09/2012 17:09 10/19/2012 14:27 Full Code [id number]  Elizabeth Sauer Inpatient   10/04/2012 01:28 10/09/2012 17:09 Full Code [id number]  Orvan Falconer, MD Inpatient       Prognosis:   Unable to determine, based on outcomes.   Discharge Planning:  SNF rehab.   Care plan was discussed with nursing staff, CM, SW.   Thank you for allowing the Palliative Medicine Team to assist in the care of this patient.   Time In: 1020 Time Out: 1035 Total Time 15 minutes Prolonged Time Billed  no       Greater than 50%  of this time was spent counseling and coordinating care related to the above assessment and plan.  Drue Novel, NP  Please contact Palliative Medicine Team phone at [phone] for questions and concerns.

## 2017-01-18 ENCOUNTER — Ambulatory Visit (HOSPITAL_COMMUNITY): Payer: Medicare Other

## 2017-01-18 LAB — GLUCOSE, CAPILLARY
GLUCOSE-CAPILLARY: 122 mg/dL — AB (ref 65–99)
GLUCOSE-CAPILLARY: 160 mg/dL — AB (ref 65–99)
Glucose-Capillary: 153 mg/dL — ABNORMAL HIGH (ref 65–99)
Glucose-Capillary: 129 mg/dL — ABNORMAL HIGH (ref 65–99)

## 2017-01-18 LAB — RENAL FUNCTION PANEL
ALBUMIN: 2.4 g/dL — AB (ref 3.5–5.0)
ANION GAP: 7 (ref 5–15)
BUN: 30 mg/dL — ABNORMAL HIGH (ref 6–20)
CHLORIDE: 98 mmol/L — AB (ref 101–111)
CO2: 30 mmol/L (ref 22–32)
Calcium: 6.8 mg/dL — ABNORMAL LOW (ref 8.9–10.3)
Creatinine, Ser: 2.79 mg/dL — ABNORMAL HIGH (ref 0.44–1.00)
GFR, EST AFRICAN AMERICAN: 19 mL/min — AB (ref >60)
GFR, EST NON AFRICAN AMERICAN: 17 mL/min — AB (ref >60)
Glucose, Bld: 128 mg/dL — ABNORMAL HIGH (ref 65–99)
PHOSPHORUS: 2.2 mg/dL — AB (ref 2.5–4.6)
POTASSIUM: 3.8 mmol/L (ref 3.5–5.1)
Sodium: 135 mmol/L (ref 135–145)

## 2017-01-18 LAB — CBC
HEMATOCRIT: 23.1 % — AB (ref 36.0–46.0)
HEMOGLOBIN: 7.1 g/dL — AB (ref 12.0–15.0)
MCH: 28 pg (ref 26.0–34.0)
MCHC: 30.7 g/dL (ref 30.0–36.0)
MCV: 90.9 fL (ref 78.0–100.0)
Platelets: 205 10*3/uL (ref 150–400)
RBC: 2.54 MIL/uL — AB (ref 3.87–5.11)
RDW: 14.6 % (ref 11.5–15.5)
WBC: 9.7 10*3/uL (ref 4.0–10.5)

## 2017-01-18 MED ORDER — EPOETIN ALFA 10000 UNIT/ML IJ SOLN
8000.0000 [IU] | INTRAMUSCULAR | Status: DC
  Administered 2017-01-21: 8000 [IU] via INTRAVENOUS
  Filled 2017-01-18 (×3): qty 1

## 2017-01-18 NOTE — Care Management Note (Signed)
Case Management Note  Patient Details  Name: Kelli [name] MRN: [id number] Date of Birth: [birth year]  Expected Discharge Date:      01/18/2017            Expected Discharge Plan:  [place]  In-House Referral:  Clinical Social Work  Discharge planning Services  CM Consult  Status of Service:  Completed, signed off  If discussed at [employer] of Stay Meetings, dates discussed:  01/18/2017  Additional Comments: Discharging to SNF once OP HD chair secured.   Sherald Barge, RN 01/18/2017, 2:18 PM

## 2017-01-18 NOTE — Progress Notes (Signed)
Physical Therapy Treatment Patient Details Name: Kelli [name] MRN: [id number] DOB: [birth year] Today's Date: 01/18/2017    History of Present Illness 66 yo female with onset of SOB and weakness was admitted, has notable L hemi presentation with chronic O2 use with low CO2, low functional level at baseline.  PMHx:  stroke, CKD, CHF, DM, HTN, L humeral fracture, carotid occlusion, a-fib, low CO2.    PT Comments    Pt reluctant to work with therapist.  Difficulty getting to follow verbal commands due to not wanting to get OOB.  Began with therex, able to complete AROM on Rt but required AAROM with Lt LE.  Pt able to transfer supine to EOB with min-mod assist and cues.  Pt able to establish and maintain seated balance.  Pt transferred to chair, stand pivot, with mod assist.  Pt overall did well today and able to increase motivation as treatment progressed.  CG is in room with patient.    Follow Up Recommendations  SNF     Equipment Recommendations  None recommended by PT    Recommendations for Other Services OT consult     Precautions / Restrictions Precautions Precautions: Fall Restrictions Weight Bearing Restrictions: No    Mobility  Bed Mobility Overal bed mobility: Needs Assistance Bed Mobility: Supine to Sit Rolling: Min guard   Supine to sit: Min assist;HOB elevated     General bed mobility comments: Pt with VC's for mobility and min assist to position to EOB  Transfers Overall transfer level: Needs assistance   Transfers: Stand Pivot Transfers   Stand pivot transfers: Mod assist          Ambulation/Gait                 Stairs            Wheelchair Mobility    Modified Rankin (Stroke Patients Only)       Balance                                            Cognition Arousal/Alertness: Awake/alert Behavior During Therapy: WFL for tasks assessed/performed Overall Cognitive Status: Within Functional Limits for tasks  assessed                                        Exercises General Exercises - Lower Extremity Ankle Circles/Pumps: AROM;15 reps Heel Slides: AROM;Both;15 reps;Supine Hip ABduction/ADduction: AROM;Both;10 reps;Supine    General Comments        Pertinent Vitals/Pain Pain Assessment: No/denies pain    Home Living                      Prior Function            PT Goals (current goals can now be found in the care plan section) Acute Rehab PT Goals Patient Stated Goal: to get straight home PT Goal Formulation: With patient/family Time For Goal Achievement: 01/23/17 Potential to Achieve Goals: Good    Frequency    Min 3X/week      PT Plan Current plan remains appropriate    Co-evaluation              AM-PAC PT "6 Clicks" Daily Activity  Outcome Measure  End of Session Equipment Utilized During Treatment: Oxygen;Gait belt Activity Tolerance: Patient limited by fatigue Patient left: with call bell/phone within reach;with family/visitor present;in chair(seated at edge of bed with tray in front) Nurse Communication: Mobility status PT Visit Diagnosis: Unsteadiness on feet (R26.81);Muscle weakness (generalized) (M62.81);Difficulty in walking, not elsewhere classified (R26.2);Hemiplegia and hemiparesis Hemiplegia - Right/Left: Left Hemiplegia - dominant/non-dominant: Non-dominant Hemiplegia - caused by: Unspecified     Time: 0927-0950 PT Time Calculation (min) (ACUTE ONLY): 23 min  Charges:  $Therapeutic Exercise: 8-22 mins $Therapeutic Activity: 8-22 mins                    G Codes:       Teena Irani, PTA/CLT [phone]    Mare Ferrari, Luiza Carranco B 01/18/2017, 9:55 AM

## 2017-01-18 NOTE — NC FL2 (Signed)
[place] MEDICAID FL2 LEVEL OF CARE SCREENING TOOL     IDENTIFICATION  Patient Name: Kelli [name] Birthdate: 11/23/1950 Sex: female Admission Date (Current Location): 01/06/2017  [facility] and [place] Number:  [employer] and Address:  [place] [address] [place]      Provider Number: [id number]  Attending Physician Name and Address:  Barton Dubois, MD  Relative Name and Phone Number:       Current Level of Care: Hospital Recommended Level of Care: [place] Prior Approval Number:    Date Approved/Denied:   PASRR Number: [id number]  Discharge Plan: SNF    Current Diagnoses: Patient Active Problem List   Diagnosis Date Noted  . Palliative care encounter   . Goals of care, counseling/discussion   . DNR (do not resuscitate) discussion   . Acute renal failure superimposed on stage 4 chronic kidney disease ([place]) 01/09/2017  . Precordial pain   . CKD (chronic kidney disease), stage IV ([place]) 01/07/2017  . Acute respiratory failure with hypoxia ([place]) 01/07/2017  . Symptomatic anemia 01/06/2017  . Diabetes mellitus type 2 in obese ([place]) 01/06/2017  . B12 deficiency 11/18/2016  . Anemia in chronic kidney disease 11/18/2016  . PUD (peptic ulcer disease) 10/13/2016  . Pacemaker 10/13/2016  . Acute on chronic diastolic CHF (congestive heart failure) ([place]) 10/13/2016  . Elevated troponin   . Hypovolemic shock ([name]) 04/26/2014  . Upper GI bleed   . Leukocytosis, unspecified 07/30/2013  . Thrombocytosis ([place]) 07/30/2013  . Subluxation of left shoulder joint 07/27/2013  . Closed dislocation of left humerus 07/26/2013  . Pyelonephritis 07/25/2013  . Acute on chronic renal failure ([place]) 07/25/2013  . Normocytic anemia 07/25/2013  . Hyponatremia 07/25/2013  . Spastic hemiplegia affecting nondominant side ([place]) 07/06/2013  . 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 UTI  (urinary tract infection) 06/15/2013  . Edema 05/28/2013  . Knee pain 05/28/2013  . Carotid stenosis, bilateral 05/22/2013  . Fall at nursing home 05/20/2013  . Hip pain 05/20/2013  . Pain in joint, lower leg 05/19/2013  . Chest congestion 05/12/2013  . E. coli UTI 05/07/2013  . Left shoulder pain due to subluxation and/or adhesive capsulitis.  05/07/2013  . Acute CVA (cerebrovascular accident): R PCA infarct per MRI 04/13/13 04/13/2013  . CVA (cerebral infarction) 04/08/2013  . Acute ischemic stroke ([place]) 04/08/2013  . Diplopia 04/08/2013  . Left-sided weakness 04/08/2013  . Vertigo 04/08/2013  . Paresthesias in left hand 04/08/2013  . Cerebellar stroke ([place]) 04/08/2013  . Dizziness and giddiness 04/08/2013  . Stroke ([place]) 04/08/2013  . Depression 04/06/2013  . Peripheral arterial disease ([place]) 03/16/2013  . History of stroke 01/30/2013  . Anxiety state, unspecified 12/08/2012  . Lumbago 12/08/2012  . Urinary frequency 11/14/2012  . Occlusion and stenosis of carotid artery with cerebral infarction 10/05/2012  . Mixed hyperlipidemia 10/05/2012  . Renal insufficiency 10/05/2012  . HTN (hypertension) 10/04/2012  . Tobacco abuse 10/04/2012  . Obesity 10/04/2012    Orientation RESPIRATION BLADDER Height & Weight     Self, Time, Situation, Place  O2(1L) Incontinent Weight: 188 lb 0.8 oz (85.3 kg) Height:  5\' 5"  (165.1 cm)  BEHAVIORAL SYMPTOMS/MOOD NEUROLOGICAL BOWEL NUTRITION STATUS      Continent (Diet renal with fluid restriction 1222mL fluid)  AMBULATORY STATUS COMMUNICATION OF NEEDS Skin   Extensive Assist(At baseline patient uses a wheelchair. ) Verbally Normal  Personal Care Assistance Level of Assistance  Bathing, Feeding, Dressing Bathing Assistance: Maximum assistance Feeding assistance: Independent Dressing Assistance: Maximum assistance     Functional Limitations Info  Sight, Hearing, Speech Sight Info: Adequate Hearing Info:  Adequate Speech Info: Adequate    SPECIAL CARE FACTORS FREQUENCY  PT (By licensed PT)     PT Frequency: 5x/week              Contractures Contractures Info: Not present    Additional Factors Info  Code Status, Allergies, Psychotropic Code Status Info: Full Code Allergies Info: Hydrochlorothiazide Psychotropic Info: Effexor XR         Current Medications (01/18/2017):  This is the current hospital active medication list Current Facility-Administered Medications  Medication Dose Route Frequency Provider Last Rate Last Dose  . 0.9 %  sodium chloride infusion  10 mL/hr Intravenous Once Fredia Sorrow, MD      . 0.9 %  sodium chloride infusion  100 mL Intravenous PRN Befekadu, Eli Phillips, MD      . 0.9 %  sodium chloride infusion  100 mL Intravenous PRN Fran Lowes, MD      . acetaminophen (TYLENOL) tablet 650 mg  650 mg Oral Q4H PRN Barton Dubois, MD      . alteplase (CATHFLO ACTIVASE) injection 2 mg  2 mg Intracatheter Once PRN Fran Lowes, MD      . alum & mag hydroxide-simeth (MAALOX/MYLANTA) 200-200-20 MG/5ML suspension 30 mL  30 mL Oral Q6H PRN Tat, Shanon Brow, MD   30 mL at 01/17/17 1408  . aspirin EC tablet 81 mg  81 mg Oral Daily Karmen Bongo, MD   81 mg at 01/18/17 0855  . atorvastatin (LIPITOR) tablet 40 mg  40 mg Oral q1800 Karmen Bongo, MD   40 mg at 01/17/17 1719  . budesonide (PULMICORT) nebulizer solution 0.5 mg  0.5 mg Nebulization BID Tat, David, MD   0.5 mg at 01/18/17 0954  . calcitRIOL (ROCALTROL) capsule 0.25 mcg  0.25 mcg Oral Daily Karmen Bongo, MD   0.25 mcg at 01/18/17 0855  . calcium acetate (PHOSLO) capsule 667 mg  667 mg Oral TID WC Bhutani, Manpreet S, MD   667 mg at 01/18/17 1152  . enoxaparin (LOVENOX) injection 30 mg  30 mg Subcutaneous Q24H Monia Sabal, PA-C   30 mg at 01/17/17 2333  . [START ON 01/19/2017] epoetin alfa (EPOGEN,PROCRIT) injection 8,000 Units  8,000 Units Intravenous Q M,W,F-HD Fran Lowes, MD      .  heparin injection 1,000 Units  1,000 Units Dialysis PRN Fran Lowes, MD      . heparin injection 1,000 Units  1,000 Units Dialysis PRN Fran Lowes, MD      . hydrALAZINE (APRESOLINE) tablet 50 mg  50 mg Oral TID Debbe Odea, MD   50 mg at 01/18/17 0855  . ibuprofen (ADVIL,MOTRIN) tablet 400 mg  400 mg Oral Q12H PRN Debbe Odea, MD      . insulin aspart (novoLOG) injection 0-15 Units  0-15 Units Subcutaneous TID WC Karmen Bongo, MD   3 Units at 01/18/17 1153  . insulin aspart (novoLOG) injection 0-5 Units  0-5 Units Subcutaneous QHS Karmen Bongo, MD   2 Units at 01/10/17 2157  . ipratropium-albuterol (DUONEB) 0.5-2.5 (3) MG/3ML nebulizer solution 3 mL  3 mL Nebulization Q6H PRN Debbe Odea, MD   3 mL at 01/16/17 2024  . lidocaine (PF) (XYLOCAINE) 1 % injection 5 mL  5 mL Intradermal PRN Fran Lowes, MD      .  lidocaine-prilocaine (EMLA) cream 1 application  1 application Topical PRN Fran Lowes, MD      . loratadine (CLARITIN) tablet 10 mg  10 mg Oral Daily Jani Gravel, MD   10 mg at 01/18/17 0855  . MEDLINE mouth rinse  15 mL Mouth Rinse BID Tat, David, MD   15 mL at 01/17/17 1407  . metoprolol succinate (TOPROL-XL) 24 hr tablet 50 mg  50 mg Oral Daily Debbe Odea, MD   50 mg at 01/18/17 0855  . pentafluoroprop-tetrafluoroeth (GEBAUERS) aerosol 1 application  1 application Topical PRN Fran Lowes, MD      . traMADol (ULTRAM) tablet 50 mg  50 mg Oral Q12H PRN Debbe Odea, MD      . venlafaxine XR (EFFEXOR-XR) 24 hr capsule 150 mg  150 mg Oral Q breakfast Karmen Bongo, MD   150 mg at 01/18/17 1683     Discharge Medications: Please see discharge summary for a list of discharge medications.  Relevant Imaging Results:  Relevant Lab Results:   Additional Information SSN [id number] Clydene Pugh, LCSW

## 2017-01-18 NOTE — Progress Notes (Signed)
Subjective: Interval History: Patient complains of feeling weak otherwise she offers no complaints.  Her appetite is improving.  She did not have any nausea or vomiting.  Objective: Vital signs in last 24 hours: Temp:  [97.6 F (36.4 C)-98.8 F (37.1 C)] 98.8 F (37.1 C) (11/20 0500) Pulse Rate:  [51-90] 88 (11/20 0500) Resp:  [18-20] 19 (11/20 0500) BP: (95-134)/(46-69) 116/54 (11/20 0500) SpO2:  [96 %-100 %] 99 % (11/20 0500) Weight:  [85.3 kg (188 lb 0.8 oz)] 85.3 kg (188 lb 0.8 oz) (11/19 0900) Weight change: 0.024 kg (0.8 oz)  Intake/Output from previous day: 11/19 0701 - 11/20 0700 In: 810 [P.O.:810] Out: 1500  Intake/Output this shift: No intake/output data recorded.  Patient on dialysis Chest is clear to auscultation Heart exam irevealed regular rate and rhythm no murmur Abdomen: Positive bowel sounds Extremities no edema   Lab Results: Recent Labs    01/17/17 0900 01/18/17 0738  WBC 12.6* 9.7  HGB 7.4* 7.1*  HCT 23.5* 23.1*  PLT 205 205   BMET:  Recent Labs    01/16/17 0644  NA 136  K 3.6  CL 99*  CO2 27  GLUCOSE 127*  BUN 63*  CREATININE 4.02*  CALCIUM 6.7*   No results for input(s): PTH in the last 72 hours. Iron Studies: No results for input(s): IRON, TIBC, TRANSFERRIN, FERRITIN in the last 72 hours.  Studies/Results: No results found.  I have reviewed the patient's current medications.  Assessment/Plan: Problem #1 renal failure: She is status post hemodialysis yesterday and should not have any uremic signs and symptoms. 2] hyperkalemia: her potassium is normal 3] history of CHF: No sign of fluid overload.  Patient presently is nonoliguric. 4] bone and mineral disorder: Her calcium  and phosphorus is in  range.  Patient is on Pholo  binder. 5] anemia: .  She is  on Epogen during dialysis.  Her hemoglobin how ever remains below our target goal but stable 6] hypertension: Her blood pressure is reasonably controlled Plan:1] patient does not  require dialysis today . will make arrangement for patient to get dialysis tomorrow for 4 hours. 2]CBC renal panel in am 3] Epogen to 8000 units IV after each dialysis. 4] if patient is going to be discharged today her outpatient dialysis could be tomorrow or the day after.     LOS: 12 days   Ares Cardozo S 01/18/2017,8:13 AM

## 2017-01-18 NOTE — Progress Notes (Signed)
PROGRESS NOTE    Kelli [name]   KDT:[id number]  DOB: 09-26-1950  DOA: 01/06/2017 PCP: Manon Hilding, MD   Brief Narrative:  Kelli [name] 66 year old female with a history of stroke with residual left hemiparesis, CKD stage III, essential hypertension, diabetes mellitus, peripheral vascular disease presentedwith 1 day history of shortness of breath, worsening leg edema, and chest discomfort. The patient also complains of a cough with clear sputum. She had subjective fevers and chills, but denied any headache, neck pain.The patient has chronic nausea and vomiting each morning which has been unchanged.  She complains of intermittent abdominal pain with loose stools. There is no dysuria, hematuria.U In ER: Hb 7.6. Chest x-ray showed pulmonary edema with BNP 966. The patientwas started on IV Lasix but remained fluid overloaded and her renal function continued to worsen. Repeat chest x-ray revealed pulmonary edema. Nephrology was consulted to assist with management and possible initiation of dialysis.  Dr. Constance Haw placed VasCath on 01/11/17.   Subjective: No chest pain, no shortness of breath, no nausea, no vomiting, no abdominal pain.  Patient hemodynamically stable.  Assessment & Plan: Acute on chronic renal failure/acute on chronic diastolic CHF - now ESRD dependent  - appreciate management per nephrology service - 11/16 permanent tunneled dialysis cath placed by IR - not interested in palliative care and will like to pursuit full code. -Continue hemodialysis for volume control  -Patient is anuric at this moment.   -We will continue Toprol and hydralazine.   -Follow low-sodium diet, daily weights, strict intake and output.  -will discharge to SNF for rehabilitation; once outpatient hemodialysis service has been secure.Marland Kitchen   acute hypoxic resp failure -Secondary to fluid overload and COPD -much improved and stable. -continue HD now for volume management -completed steroids  tapering -will continue PRN nebulizer.  Acute wheezing- no prior h/o COPD -improved and currently no wheezing and with good O2 sat on RA. -patient has completed steroids tapering  -will continue PRN nebulizer treatment only  Leukocytosis -due to steroids most likely -no signs of infection -continue trending down appropriately.  Metabolic acidosis -on sodium bicarb tablets -this can be discontinue once fully stabilize on HD. -follow renal service inputs/recommendations.    HTN (hypertension) -will continue hydralazine and toprol -HD will also help controlling BP and volume.    Elevated troponin -most likely associated with CKD -patient denying CP and SOB  Sinus arrhythmia with occasional short runs of SVT - she has a loop recorded which was placed after a CVA in 2/15 - one short episode of SVT yesterday and one on 11/15 - d/c TID nebulizer and minimize use of albuterol - Dr Domenic Polite aware of the first episode - no further rec's currently -folllow electrolytes closely and continue b-blocker.    Symptomatic anemia - hemolysis labs negative - likely due to chronic disease - Hb 7.1 -received 2 u PRBC on 11/8  -will continue receiving IV iron and Epogen -will follow renal service recommendations -no signs of acute bleeding  -transfuse with HD as needed for Hgb < 7.0    Diabetes mellitus type 2 in obese  - continue SSI     Hx of Stroke (cryptogenic)with left hemiparesis  - as mentioned, has a loop recorder in place - will continue ASA - continue statins  - in agreement for SNF; but will like to discussed with husband.    Mixed hyperlipidemia -continue statin   Severe fatigue/ depression with flat affect -patient agrees to discussed with her husband and caregiver  for final decision -understand short term rehabilitation at SNF is the best and safest discharge plans.  Occasional leg pain -stable and no major complaints today -will continue PRN Ibuprofen and Tramadol    DVT prophylaxis: Lovenox Code Status: Full code Family Communication: sister at bedside Disposition Plan: Patient in agreement to go to a skilled nursing facility for further care and rehabilitation.  At this moment waiting on outpatient hemodialysis spot in order to be discharged.  Appreciate assistance from Education officer, museum.  Consultants:   nephro  Palliative care  IR  Procedures:   HD cath by surgery  Tunneled dialysis cath per IR  2 D ECHO Left ventricle: The cavity size was normal. Systolic function was   normal. The estimated ejection fraction was in the range of 55%   to 60%. Wall motion was normal; there were no regional wall   motion abnormalities. The study is not technically sufficient to   allow evaluation of LV diastolic function due to tachycardia and   E/a fusion. Moderate to severe left ventricular hypertrophy. - Aortic valve: Trileaflet; mildly thickened, mildly calcified   leaflets. Sclerosis without stenosis. There was no stenosis. - Mitral valve: Calcified annulus. - Left atrium: The atrium was mildly dilated. - Atrial septum: No defect or patent foramen ovale was identified.  Antimicrobials:  Anti-infectives (From admission, onward)   Start     Dose/Rate Route Frequency Ordered Stop   01/14/17 1415  ceFAZolin (ANCEF) IVPB 2g/100 mL premix     2 g 200 mL/hr over 30 Minutes Intravenous To Radiology 01/14/17 1413 01/14/17 1455      Objective: Vitals:   01/17/17 2118 01/18/17 0000 01/18/17 0500 01/18/17 1336  BP: (!) 119/57  (!) 116/54 (!) 139/40  Pulse: 81  88 75  Resp: 20  19 18   Temp: 98.5 F (36.9 C)  98.8 F (37.1 C) 99.1 F (37.3 C)  TempSrc: Oral  Oral Oral  SpO2: 100% 98% 99% 100%  Weight:      Height:        Intake/Output Summary (Last 24 hours) at 01/18/2017 1516 Last data filed at 01/17/2017 1900 Gross per 24 hour  Intake 345 ml  Output -  Net 345 ml   Filed Weights   01/16/17 0642 01/17/17 0457 01/17/17 0900  Weight:  84.7 kg (186 lb 12.8 oz) 85.3 kg (188 lb) 85.3 kg (188 lb 0.8 oz)   Examination: General exam:  No fever, patient in no acute distress, denying chest pain or shortness of breath.  No nausea, no vomiting, able to follow commands appropriately.  Feeling weak and tired. Respiratory system: Good air movement bilaterally, no wheezing, no crackles.   Cardiovascular system:  S1 and S2, no rubs, no gallops, no JVD appreciated; no murmurs. Gastrointestinal system:  soft, nontender, nondistended, positive bowel sounds, no guarding.   Central nervous system:  Alert, awake and oriented x3; patient able to move 4 limbs spontaneously even left lower extremity weakness was appreciated on exam.  No new neurologic deficit seen.  Cranial nerves II through XII grossly intact.    Extremities: No clubbing, no cyanosis, trace edema bilaterally appreciated on exam.   Psychiatry:   Patient has remained with a flat affect and decision of no being very conversant or interactive during examination.  No suicidal ideation or hallucinations.  Data Reviewed: I have personally reviewed following labs and imaging studies  CBC: Recent Labs  Lab 01/12/17 1243 01/14/17 0506 01/15/17 0657 01/17/17 0900 01/18/17 0738  WBC 20.4* 17.3*  16.8* 12.6* 9.7  HGB 10.9* 9.2* 8.7* 7.4* 7.1*  HCT 32.9* 27.9* 27.7* 23.5* 23.1*  MCV 86.1 87.7 89.9 90.0 90.9  PLT 357 224 213 205 675   Basic Metabolic Panel: Recent Labs  Lab 01/12/17 0618 01/13/17 0551 01/14/17 0506 01/15/17 0657 01/16/17 0644 01/18/17 0738  NA 134* 134* 133*  133* 136 136 135  K 4.4 4.0 3.5  3.5 3.7 3.6 3.8  CL 99* 97* 96*  96* 98* 99* 98*  CO2 22 25 26  26 26 27 30   GLUCOSE 206* 137* 124*  124* 127* 127* 128*  BUN 108* 69* 80*  82* 54* 63* 30*  CREATININE 5.21* 3.94* 4.33*  4.38* 3.54* 4.02* 2.79*  CALCIUM 7.6* 7.2* 6.9*  6.9* 6.9* 6.7* 6.8*  PHOS 6.5*  --  4.8*  4.8* 3.8 4.0 2.2*   GFR: Estimated Creatinine Clearance: 21.4 mL/min (A) (by C-G  formula based on SCr of 2.79 mg/dL (H)).   Liver Function Tests: Recent Labs  Lab 01/12/17 0618 01/14/17 0506 01/15/17 0657 01/16/17 0644 01/18/17 0738  ALBUMIN 2.9* 2.6*  2.5* 2.6* 2.5* 2.4*   Coagulation Profile: Recent Labs  Lab 01/13/17 1142  INR 1.05   CBG: Recent Labs  Lab 01/17/17 1129 01/17/17 1640 01/17/17 2124 01/18/17 0745 01/18/17 1152  GLUCAP 129* 133* 137* 122* 153*   Urine analysis:    Component Value Date/Time   COLORURINE YELLOW 01/10/2017 1150   APPEARANCEUR CLOUDY (A) 01/10/2017 1150   LABSPEC 1.011 01/10/2017 1150   PHURINE 7.0 01/10/2017 1150   GLUCOSEU NEGATIVE 01/10/2017 1150   HGBUR NEGATIVE 01/10/2017 1150   BILIRUBINUR NEGATIVE 01/10/2017 1150   KETONESUR NEGATIVE 01/10/2017 1150   PROTEINUR 100 (A) 01/10/2017 1150   UROBILINOGEN 0.2 04/25/2014 2315   NITRITE NEGATIVE 01/10/2017 1150   LEUKOCYTESUR MODERATE (A) 01/10/2017 1150    Radiology Studies: No results found.    Scheduled Meds: . aspirin EC  81 mg Oral Daily  . atorvastatin  40 mg Oral q1800  . budesonide (PULMICORT) nebulizer solution  0.5 mg Nebulization BID  . calcitRIOL  0.25 mcg Oral Daily  . calcium acetate  667 mg Oral TID WC  . enoxaparin (LOVENOX) injection  30 mg Subcutaneous Q24H  . [START ON 01/19/2017] epoetin (EPOGEN/PROCRIT) injection  8,000 Units Intravenous Q M,W,F-HD  . hydrALAZINE  50 mg Oral TID  . insulin aspart  0-15 Units Subcutaneous TID WC  . insulin aspart  0-5 Units Subcutaneous QHS  . loratadine  10 mg Oral Daily  . mouth rinse  15 mL Mouth Rinse BID  . metoprolol succinate  50 mg Oral Daily  . venlafaxine XR  150 mg Oral Q breakfast   Continuous Infusions: . sodium chloride    . sodium chloride    . sodium chloride       LOS: 12 days    Time spent in minutes: 25 minutes    Barton Dubois, MD [facility] Pager: [phone] [email or web address] Password [email or web address] 01/18/2017, 3:16 PM

## 2017-01-19 ENCOUNTER — Inpatient Hospital Stay (HOSPITAL_COMMUNITY): Payer: 59

## 2017-01-19 DIAGNOSIS — G9341 Metabolic encephalopathy: Secondary | ICD-10-CM

## 2017-01-19 DIAGNOSIS — N182 Chronic kidney disease, stage 2 (mild): Secondary | ICD-10-CM

## 2017-01-19 LAB — RENAL FUNCTION PANEL
ANION GAP: 8 (ref 5–15)
Albumin: 2.3 g/dL — ABNORMAL LOW (ref 3.5–5.0)
BUN: 70 mg/dL — AB (ref 6–20)
CHLORIDE: 97 mmol/L — AB (ref 101–111)
CO2: 28 mmol/L (ref 22–32)
Calcium: 6.6 mg/dL — ABNORMAL LOW (ref 8.9–10.3)
Creatinine, Ser: 3.44 mg/dL — ABNORMAL HIGH (ref 0.44–1.00)
GFR calc Af Amer: 15 mL/min — ABNORMAL LOW (ref >60)
GFR calc non Af Amer: 13 mL/min — ABNORMAL LOW (ref >60)
GLUCOSE: 148 mg/dL — AB (ref 65–99)
POTASSIUM: 4.3 mmol/L (ref 3.5–5.1)
Phosphorus: 1.8 mg/dL — ABNORMAL LOW (ref 2.5–4.6)
Sodium: 133 mmol/L — ABNORMAL LOW (ref 135–145)

## 2017-01-19 LAB — BLOOD GAS, ARTERIAL
Acid-Base Excess: 4.8 mmol/L — ABNORMAL HIGH (ref 0.0–2.0)
Bicarbonate: 28.9 mmol/L — ABNORMAL HIGH (ref 20.0–28.0)
Drawn by: 317771
O2 Content: 1 L/min
O2 Saturation: 98.8 %
pCO2 arterial: 37.6 mmHg (ref 32.0–48.0)
pH, Arterial: 7.488 — ABNORMAL HIGH (ref 7.350–7.450)
pO2, Arterial: 124 mmHg — ABNORMAL HIGH (ref 83.0–108.0)

## 2017-01-19 LAB — GLUCOSE, CAPILLARY
GLUCOSE-CAPILLARY: 150 mg/dL — AB (ref 65–99)
GLUCOSE-CAPILLARY: 106 mg/dL — AB (ref 65–99)
Glucose-Capillary: 95 mg/dL (ref 65–99)
Glucose-Capillary: 121 mg/dL — ABNORMAL HIGH (ref 65–99)

## 2017-01-19 LAB — CBC
HEMATOCRIT: 20.2 % — AB (ref 36.0–46.0)
HEMOGLOBIN: 6.1 g/dL — AB (ref 12.0–15.0)
MCH: 27.9 pg (ref 26.0–34.0)
MCHC: 30.2 g/dL (ref 30.0–36.0)
MCV: 92.2 fL (ref 78.0–100.0)
Platelets: 212 10*3/uL (ref 150–400)
RBC: 2.19 MIL/uL — ABNORMAL LOW (ref 3.87–5.11)
RDW: 15.1 % (ref 11.5–15.5)
WBC: 11.6 10*3/uL — ABNORMAL HIGH (ref 4.0–10.5)

## 2017-01-19 LAB — PREPARE RBC (CROSSMATCH)

## 2017-01-19 LAB — AMMONIA: AMMONIA: 14 umol/L (ref 9–35)

## 2017-01-19 MED ORDER — PANTOPRAZOLE SODIUM 40 MG IV SOLR
40.0000 mg | Freq: Two times a day (BID) | INTRAVENOUS | Status: DC
  Administered 2017-01-19 – 2017-01-21 (×5): 40 mg via INTRAVENOUS
  Filled 2017-01-19 (×5): qty 40

## 2017-01-19 MED ORDER — SODIUM CHLORIDE 0.9 % IV SOLN: 100.0000 mL | INTRAVENOUS | Status: DC | PRN

## 2017-01-19 MED ORDER — LORATADINE 10 MG PO TABS: 10.0000 mg | ORAL_TABLET | Freq: Every day | ORAL | 1 refills | Status: DC

## 2017-01-19 MED ORDER — ONDANSETRON HCL 4 MG/2ML IJ SOLN
4.0000 mg | Freq: Four times a day (QID) | INTRAMUSCULAR | Status: DC | PRN
  Administered 2017-01-22: 4 mg via INTRAVENOUS

## 2017-01-19 MED ORDER — TRAMADOL HCL 50 MG PO TABS: 50.0000 mg | ORAL_TABLET | Freq: Two times a day (BID) | ORAL | 0 refills | Status: DC | PRN

## 2017-01-19 MED ORDER — NALOXONE HCL 0.4 MG/ML IJ SOLN: 0.4000 mg | INTRAMUSCULAR | Status: DC | PRN

## 2017-01-19 MED ORDER — HEPARIN SODIUM (PORCINE) 1000 UNIT/ML IJ SOLN
INTRAMUSCULAR | Status: AC
  Administered 2017-01-19: 1000 [IU] via INTRAVENOUS_CENTRAL
  Filled 2017-01-19: qty 1

## 2017-01-19 MED ORDER — SODIUM CHLORIDE 0.9 % IV SOLN
Freq: Once | INTRAVENOUS | Status: AC
  Administered 2017-01-19: 11:00:00 via INTRAVENOUS

## 2017-01-19 MED ORDER — NALOXONE HCL 0.4 MG/ML IJ SOLN
0.4000 mg | Freq: Once | INTRAMUSCULAR | Status: AC
  Administered 2017-01-19: 0.4 mg via INTRAVENOUS
  Filled 2017-01-19: qty 1

## 2017-01-19 MED ORDER — IPRATROPIUM-ALBUTEROL 0.5-2.5 (3) MG/3ML IN SOLN: 3.0000 mL | Freq: Four times a day (QID) | RESPIRATORY_TRACT | 1 refills | Status: DC | PRN

## 2017-01-19 MED ORDER — EPOETIN ALFA 4000 UNIT/ML IJ SOLN
INTRAMUSCULAR | Status: AC
  Administered 2017-01-19: 8000 [IU]
  Filled 2017-01-19: qty 2

## 2017-01-19 MED ORDER — ASPIRIN 81 MG PO TBEC: 81.0000 mg | DELAYED_RELEASE_TABLET | Freq: Every day | ORAL | 1 refills | Status: DC

## 2017-01-19 MED ORDER — ONDANSETRON HCL 4 MG/2ML IJ SOLN
4.0000 mg | Freq: Four times a day (QID) | INTRAMUSCULAR | Status: DC | PRN
  Administered 2017-01-19 (×2): 4 mg via INTRAVENOUS
  Filled 2017-01-19 (×3): qty 2

## 2017-01-19 MED ORDER — ALBUMIN HUMAN 25 % IV SOLN
25.0000 g | Freq: Once | INTRAVENOUS | Status: AC
  Administered 2017-01-19: 25 g via INTRAVENOUS
  Filled 2017-01-19: qty 100

## 2017-01-19 MED ORDER — ALBUMIN HUMAN 25 % IV SOLN
INTRAVENOUS | Status: AC
  Filled 2017-01-19: qty 50

## 2017-01-19 MED ORDER — INSULIN ASPART 100 UNIT/ML ~~LOC~~ SOLN: 0.0000 [IU] | Freq: Three times a day (TID) | SUBCUTANEOUS | 11 refills | Status: DC

## 2017-01-19 NOTE — Progress Notes (Signed)
Pt being transported to radiology for ultrasound and CT scan via transport staff.

## 2017-01-19 NOTE — Clinical Social Work Note (Addendum)
SW following. Spoke with [facility] and pt is set up with outpt dialysis at [facility] in [place] on TRS schedule with 11:30 start time. Received calls from Ursa at [facility] in [place] at [place] stating that they have each accepted pt. Met with pt's husband to update. He is disappointed that pt is not accepted at [facility]. Encouraged him to visit the other two facilities and think about which one he would prefer. Pt is not discharging today as she is not stable per MD. Marshia Ly SW will follow up on Friday to learn pt's husband's decision and to further assist with dc planning.  Horris Latino at [facility] states that they cannot accept a new admission for dialysis on Thursday or Friday this week. If pt is discharged and needs dialysis Monday, they can do that even though pt is currently scheduled in a TRS time slot. Horris Latino says that SW needs to notify [employer] today if pt will need dialysis Monday. Otherwise, we need to let them know pt's dc date and start date for [facility]. SW contacted [facility] [phone]) to state that pt would need to go to Out pt on Monday. If pt ends up not being discharged by then, SW will contact them to let them know. Guest Services will be in the office on Friday and can be reached if needed.  Also received call from [facility] at [facility] stating that they are still evaluating pt and will update SW about acceptance or not as soon as they can.  3:50pm: Notified by pt's husband that he went and toured [facility] and that is where he would like pt to go when she is discharged.

## 2017-01-19 NOTE — Discharge Summary (Addendum)
Physician Discharge Summary  DENICIA PAGLIARULO MRN: [id number] DOB/AGE: 03/09/1950 66 y.o.  PCP: Manon Hilding, MD   Admit date: 01/06/2017 Discharge date: 01/19/2017  Discharge Diagnoses:    Principal Problem:   Acute on chronic renal failure (HCC) Active Problems:   HTN (hypertension)   Mixed hyperlipidemia   Elevated troponin   Acute on chronic diastolic CHF (congestive heart failure) (HCC)   Symptomatic anemia   Diabetes mellitus type 2 in obese (HCC)   CKD (chronic kidney disease), stage IV (HCC)   Acute respiratory failure with hypoxia (HCC)   Precordial pain   Acute renal failure superimposed on stage 4 chronic kidney disease ([place])   Palliative care encounter   Goals of care, counseling/discussion   DNR (do not resuscitate) discussion  Addendum Patient extremely somnolent  Hold discharge Gave one dose of narcan  Will get ABG DC EFFEXOR, TRAMADOL    Follow-up recommendations Follow-up with PCP in 3-5 days , including all  additional recommended appointments as below Follow-up CBC, CMP in 3-5 days 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 Qty: 30 tablet, Refills: 1    insulin aspart (NOVOLOG) 100 UNIT/ML injection Inject 0-15 Units into the skin 3 (three) times daily with meals. Qty: 10 mL, Refills: 11    ipratropium-albuterol (DUONEB) 0.5-2.5 (3) MG/3ML SOLN Take 3 mLs by nebulization every 6 (six) hours as needed. Qty: 360 mL, Refills: 1    loratadine (CLARITIN) 10 MG tablet Take 1 tablet (10 mg total) by mouth daily. Qty: 30 tablet, Refills: 1    traMADol (ULTRAM) 50 MG tablet Take 1 tablet (50 mg total) by mouth every 12 (twelve) hours as needed for moderate pain. Qty: 30 tablet, Refills: 0      CONTINUE these medications which have NOT CHANGED   Details  atorvastatin (LIPITOR) 40  MG tablet Take 1 tablet (40 mg total) by mouth daily at 6 PM. Qty: 30 tablet, Refills: 0    calcitRIOL (ROCALTROL) 0.25 MCG capsule Take 1 capsule (0.25 mcg total) by mouth daily. Qty: 30 capsule, Refills: 0    hydrALAZINE (APRESOLINE) 50 MG tablet Take 1 tablet (50 mg total) by mouth 3 (three) times daily. Qty: 90 tablet, Refills: 0    nitrofurantoin (MACRODANTIN) 100 MG capsule Take 100 mg 2 (two) times daily by mouth.    potassium chloride SA (K-DUR,KLOR-CON) 20 MEQ tablet Take 1 tablet (20 mEq total) by mouth daily. Qty: 14 tablet, Refills: 0    sodium bicarbonate 650 MG tablet Take 1 tablet (650 mg total) by mouth 3 (three) times daily. Qty: 90 tablet, Refills: 0    torsemide (DEMADEX) 20 MG tablet Take 2 tablets (40 mg total) by mouth daily. Qty: 60 tablet, Refills: 0    venlafaxine XR (EFFEXOR-XR) 150 MG 24 hr capsule Take 150 mg by mouth daily with breakfast.    metoprolol succinate (TOPROL-XL) 100 MG 24 hr tablet Take 1 tablet (100 mg total) by mouth daily. Take with or immediately following a meal. Qty: 30 tablet, Refills: 0      STOP taking these medications     amLODipine (NORVASC) 10 MG tablet          Discharge Condition: overall prognosis guarded   Discharge Instructions Get Medicines reviewed and adjusted: Please  take all your medications with you for your next visit with your Primary MD  Please request your Primary MD to go over all hospital tests and procedure/radiological results at the follow up, please ask your Primary MD to get all Hospital records sent to his/her office.  If you experience worsening of your admission symptoms, develop shortness of breath, life threatening emergency, suicidal or homicidal thoughts you must seek medical attention immediately by calling 911 or calling your MD immediately if symptoms less severe.  You must read complete instructions/literature along with all the possible adverse reactions/side effects for all the  Medicines you take and that have been prescribed to you. Take any new Medicines after you have completely understood and accpet all the possible adverse reactions/side effects.   Do not drive when taking Pain medications.   Do not take more than prescribed Pain, Sleep and Anxiety Medications  Special Instructions: If you have smoked or chewed Tobacco in the last 2 yrs please stop smoking, stop any regular Alcohol and or any Recreational drug use.  Wear Seat belts while driving.  Please note  You were cared for by a hospitalist during your hospital stay. Once you are discharged, your primary care physician will handle any further medical issues. Please note that NO REFILLS for any discharge medications will be authorized once you are discharged, as it is imperative that you return to your primary care physician (or establish a relationship with a primary care physician if you do not have one) for your aftercare needs so that they can reassess your need for medications and monitor your lab values.     Allergies  Allergen Reactions  . Hydrochlorothiazide Nausea And Vomiting      Disposition: SNF  Consultants:   nephro  Palliative care  IR      Significant Diagnostic Studies:  Ct Abdomen Pelvis Wo Contrast  Result Date: 01/07/2017 CLINICAL DATA:  Abdominal pain. Decreased hemoglobin. Clinical suspicion for retroperitoneal hemorrhage. EXAM: CT ABDOMEN AND PELVIS WITHOUT CONTRAST TECHNIQUE: Multidetector CT imaging of the abdomen and pelvis was performed following the standard protocol without IV contrast. COMPARISON:  None. FINDINGS: Lower chest: Bibasilar heterogeneous airspace disease and small bilateral pleural effusions. Hepatobiliary: No mass visualized on this unenhanced exam. Gallbladder is unremarkable. Pancreas: No mass or inflammatory process visualized on this unenhanced exam. Spleen:  Within normal limits in size. Adrenals/Urinary tract: Bilateral renal parenchymal  atrophy, left side greater than right, consistent with history of chronic kidney disease. Renal vascular calcification noted. Several tiny calculi are seen within the left renal collecting system and proximal left ureter, without hydronephrosis. Unremarkable unopacified urinary bladder. Stomach/Bowel: No evidence of obstruction, inflammatory process, or abnormal fluid collections. Normal appendix visualized. Mild left-sided colonic diverticulosis seen, without evidence of diverticulitis. Vascular/Lymphatic: No pathologically enlarged lymph nodes identified. Infrarenal abdominal aortic aneurysm measuring 3.4 x 3.3 cm. Reproductive: Prior hysterectomy noted. Adnexal regions are unremarkable in appearance. Other: No evidence of retroperitoneal hemorrhage or hemoperitoneum. Mild edema in subcutaneous tissues. Musculoskeletal:  No suspicious bone lesions identified. IMPRESSION: No evidence of retroperitoneal hemorrhage or hemoperitoneum. Several tiny calculi in left renal collecting system and proximal left ureter, without hydronephrosis. Bilateral renal parenchymal atrophy, left side greater than right. Colonic diverticulosis, without radiographic evidence of diverticulitis. Small bilateral pleural effusions and bilateral lower lobe airspace disease. 3.4 cm infrarenal abdominal aortic aneurysm. Recommend followup by ultrasound in 3 years. This recommendation follows ACR consensus guidelines: White Paper of the ACR Incidental Findings Committee II on Vascular Findings. J Am  Coll Radiol 2013; 10:789-794 Electronically Signed   By: Earle Gell M.D.   On: 01/07/2017 10:22   Dg Chest 2 View  Result Date: 01/06/2017 CLINICAL DATA:  Trouble breathing x1 day with tightness and congestion in chest. Weakness. Hx of diabetes, HTN-controlled with medication, and low hemoglobin. 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 EXAM: CHEST  2 VIEW COMPARISON:  10/19/2016 FINDINGS: The heart is enlarged. There is pulmonary  vascular congestion. Prominent interstitial markings are consistent with mild edema. Trace bilateral pleural effusions are also suspected. There is mild bibasilar atelectasis. IMPRESSION: Cardiomegaly and mild pulmonary edema. Electronically Signed   By: Nolon Nations M.D.   On: 01/06/2017 16:56   Ir Fluoro Guide Cv Line Left  Result Date: 01/14/2017 INDICATION: End-stage renal disease, in need of durable intravenous access for continuation of dialysis. EXAM: TUNNELED CENTRAL VENOUS HEMODIALYSIS CATHETER PLACEMENT WITH ULTRASOUND AND FLUOROSCOPIC GUIDANCE MEDICATIONS: Ancef 2 gm IV . The antibiotic was given in an appropriate time interval prior to skin puncture. ANESTHESIA/SEDATION: Versed 2 mg IV; Fentanyl 50 mcg IV; Moderate Sedation Time:  16 minutes The patient was continuously monitored during the procedure by the interventional radiology nurse under my direct supervision. FLUOROSCOPY TIME:  Fluoroscopy Time: 36 seconds (5 mGy). COMPLICATIONS: None immediate. PROCEDURE: Informed written consent was obtained from the patient after a discussion of the risks, benefits, and alternatives to treatment. Questions regarding the procedure were encouraged and answered. The left neck and chest were prepped with chlorhexidine in a sterile fashion, and a sterile drape was applied covering the operative field. Maximum barrier sterile technique with sterile gowns and gloves were used for the procedure. A timeout was performed prior to the initiation of the procedure. After creating a small venotomy incision, a micropuncture kit was utilized to access the internal jugular vein. Real-time ultrasound guidance was utilized for vascular access including the acquisition of a permanent ultrasound image documenting patency of the accessed vessel. The microwire was utilized to measure appropriate catheter length. A stiff Glidewire was advanced to the level of the IVC and the micropuncture sheath was exchanged for a peel-away  sheath. A palindrome tunneled hemodialysis catheter measuring 23 cm from tip to cuff was tunneled in a retrograde fashion from the anterior chest wall to the venotomy incision. The catheter was then placed through the peel-away sheath with tips ultimately positioned within the superior aspect of the right atrium. Final catheter positioning was confirmed and documented with a spot radiographic image. The catheter aspirates and flushes normally. The catheter was flushed with appropriate volume heparin dwells. The catheter exit site was secured with a 0-Prolene retention suture. The venotomy incision was closed with an interrupted 4-0 Vicryl, Dermabond and Steri-strips. Dressings were applied. The patient tolerated the procedure well without immediate post procedural complication. IMPRESSION: Successful placement of 23 cm tip to cuff tunneled hemodialysis catheter via the left internal jugular vein with tips terminating within the superior aspect of the right atrium. The catheter is ready for immediate use. Electronically Signed   By: Sandi Mariscal M.D.   On: 01/14/2017 15:28   Ir US Guide Vasc Access Left  Result Date: 01/14/2017 INDICATION: End-stage renal disease, in need of durable intravenous access for continuation of dialysis. EXAM: TUNNELED CENTRAL VENOUS HEMODIALYSIS CATHETER PLACEMENT WITH ULTRASOUND AND FLUOROSCOPIC GUIDANCE MEDICATIONS: Ancef 2 gm IV . The antibiotic was given in an appropriate time interval prior to skin puncture. ANESTHESIA/SEDATION: Versed 2 mg IV; Fentanyl 50 mcg IV; Moderate Sedation Time:  16 minutes The  patient was continuously monitored during the procedure by the interventional radiology nurse under my direct supervision. FLUOROSCOPY TIME:  Fluoroscopy Time: 36 seconds (5 mGy). COMPLICATIONS: None immediate. PROCEDURE: Informed written consent was obtained from the patient after a discussion of the risks, benefits, and alternatives to treatment. Questions regarding the procedure  were encouraged and answered. The left neck and chest were prepped with chlorhexidine in a sterile fashion, and a sterile drape was applied covering the operative field. Maximum barrier sterile technique with sterile gowns and gloves were used for the procedure. A timeout was performed prior to the initiation of the procedure. After creating a small venotomy incision, a micropuncture kit was utilized to access the internal jugular vein. Real-time ultrasound guidance was utilized for vascular access including the acquisition of a permanent ultrasound image documenting patency of the accessed vessel. The microwire was utilized to measure appropriate catheter length. A stiff Glidewire was advanced to the level of the IVC and the micropuncture sheath was exchanged for a peel-away sheath. A palindrome tunneled hemodialysis catheter measuring 23 cm from tip to cuff was tunneled in a retrograde fashion from the anterior chest wall to the venotomy incision. The catheter was then placed through the peel-away sheath with tips ultimately positioned within the superior aspect of the right atrium. Final catheter positioning was confirmed and documented with a spot radiographic image. The catheter aspirates and flushes normally. The catheter was flushed with appropriate volume heparin dwells. The catheter exit site was secured with a 0-Prolene retention suture. The venotomy incision was closed with an interrupted 4-0 Vicryl, Dermabond and Steri-strips. Dressings were applied. The patient tolerated the procedure well without immediate post procedural complication. IMPRESSION: Successful placement of 23 cm tip to cuff tunneled hemodialysis catheter via the left internal jugular vein with tips terminating within the superior aspect of the right atrium. The catheter is ready for immediate use. Electronically Signed   By: Sandi Mariscal M.D.   On: 01/14/2017 15:28   Dg Chest Port 1 View  Result Date: 01/09/2017 CLINICAL DATA:  CHF  EXAM: PORTABLE CHEST 1 VIEW COMPARISON:  01/07/2017 FINDINGS: Severe bilateral airspace disease similar to the prior study. Probable congestive heart failure with edema. Pneumonia not excluded. Bibasilar atelectasis and small left effusion. Right jugular central venous catheter tip in the SVC unchanged IMPRESSION: Diffuse bilateral airspace disease unchanged. Probable heart failure. Electronically Signed   By: Franchot Gallo M.D.   On: 01/09/2017 11:27   Dg Chest Port 1 View  Result Date: 01/07/2017 CLINICAL DATA:  66 year old female central line placement. EXAM: PORTABLE CHEST 1 VIEW COMPARISON:  1016 hours today and earlier. FINDINGS: Portable AP semi upright view at 1432 hours. New right IJ approach central line is in place, tip projects at the level of the right mainstem bronchus corresponding to the lower SVC. No pneumothorax. Stable cardiac size and mediastinal contours. Stable bilateral ventilation since this morning including dense retrocardiac opacity which has progressed since 01/06/2017. Visualized tracheal air column is within normal limits. IMPRESSION: 1. Right IJ central line tip at the lower SVC level. No pneumothorax or adverse features. 2. Stable ventilation since 1016 hours today. Electronically Signed   By: Genevie Ann M.D.   On: 01/07/2017 15:02   Dg Chest Port 1 View  Result Date: 01/07/2017 CLINICAL DATA:  Respiratory distress. EXAM: PORTABLE CHEST 1 VIEW COMPARISON:  01/06/2017 FINDINGS: Lungs are adequately inflated with worsening patchy multifocal airspace opacification most notable over the left midlung and right upper lobe likely a multifocal  infectious process. Possible small amount left pleural fluid. Mild stable cardiomegaly. Remainder of the exam is unchanged. IMPRESSION: Slight worsening bilateral multifocal airspace process most notable over the left midlung and right upper lobe suggesting multifocal infection. Stable cardiomegaly. Electronically Signed   By: Marin Olp M.D.    On: 01/07/2017 10:37     Echo LV EF: 55% -   60%  ------------------------------------------------------------------- Indications:      CHF - 428.0.  ------------------------------------------------------------------- History:   PMH:  Former Smoker. Elevated Troponin.  Stroke.  Risk factors:  Hypertension. Diabetes mellitus. Dyslipidemia.  ------------------------------------------------------------------- Study Conclusions  - Left ventricle: The cavity size was normal. Systolic function was   normal. The estimated ejection fraction was in the range of 55%   to 60%. Wall motion was normal; there were no regional wall   motion abnormalities. The study is not technically sufficient to   allow evaluation of LV diastolic function due to tachycardia and   E/a fusion. Moderate to severe left ventricular hypertrophy. - Aortic valve: Trileaflet; mildly thickened, mildly calcified   leaflets. Sclerosis without stenosis. There was no stenosis. - Mitral valve: Calcified annulus. - Left atrium: The atrium was mildly dilated. - Atrial septum: No defect or patent foramen ovale was identified.   Filed Weights   01/17/17 0900 01/19/17 0540 01/19/17 0845  Weight: 85.3 kg (188 lb 0.8 oz) 85.1 kg (187 lb 9.8 oz) 85.1 kg (187 lb 9.8 oz)     Microbiology: No results found for this or any previous visit (from the past 240 hour(s)).     Blood Culture    Component Value Date/Time   SDES URINE, CATHETERIZED 09/17/2013 1815   SPECREQUEST NONE 09/17/2013 1815   CULT  09/17/2013 1815    ESCHERICHIA COLI Performed at [place] 09/20/2013 FINAL 09/17/2013 1815      Labs: Results for orders placed or performed during the hospital encounter of 01/06/17 (from the past 48 hour(s))  Glucose, capillary     Status: Abnormal   Collection Time: 01/17/17 11:29 AM  Result Value Ref Range   Glucose-Capillary 129 (H) 65 - 99 mg/dL  Glucose, capillary     Status: Abnormal    Collection Time: 01/17/17  4:40 PM  Result Value Ref Range   Glucose-Capillary 133 (H) 65 - 99 mg/dL  Glucose, capillary     Status: Abnormal   Collection Time: 01/17/17  9:24 PM  Result Value Ref Range   Glucose-Capillary 137 (H) 65 - 99 mg/dL   Comment 1 Notify RN    Comment 2 Document in Chart   CBC     Status: Abnormal   Collection Time: 01/18/17  7:38 AM  Result Value Ref Range   WBC 9.7 4.0 - 10.5 K/uL   RBC 2.54 (L) 3.87 - 5.11 MIL/uL   Hemoglobin 7.1 (L) 12.0 - 15.0 g/dL   HCT 23.1 (L) 36.0 - 46.0 %   MCV 90.9 78.0 - 100.0 fL   MCH 28.0 26.0 - 34.0 pg   MCHC 30.7 30.0 - 36.0 g/dL   RDW 14.6 11.5 - 15.5 %   Platelets 205 150 - 400 K/uL  Renal function panel     Status: Abnormal   Collection Time: 01/18/17  7:38 AM  Result Value Ref Range   Sodium 135 135 - 145 mmol/L   Potassium 3.8 3.5 - 5.1 mmol/L   Chloride 98 (L) 101 - 111 mmol/L   CO2 30 22 - 32 mmol/L   Glucose,  Bld 128 (H) 65 - 99 mg/dL   BUN 30 (H) 6 - 20 mg/dL   Creatinine, Ser 2.79 (H) 0.44 - 1.00 mg/dL   Calcium 6.8 (L) 8.9 - 10.3 mg/dL   Phosphorus 2.2 (L) 2.5 - 4.6 mg/dL   Albumin 2.4 (L) 3.5 - 5.0 g/dL   GFR calc non Af Amer 17 (L) >60 mL/min   GFR calc Af Amer 19 (L) >60 mL/min    Comment: (NOTE) The eGFR has been calculated using the CKD EPI equation. This calculation has not been validated in all clinical situations. eGFR's persistently <60 mL/min signify possible Chronic Kidney Disease.    Anion gap 7 5 - 15  Glucose, capillary     Status: Abnormal   Collection Time: 01/18/17  7:45 AM  Result Value Ref Range   Glucose-Capillary 122 (H) 65 - 99 mg/dL  Glucose, capillary     Status: Abnormal   Collection Time: 01/18/17 11:52 AM  Result Value Ref Range   Glucose-Capillary 153 (H) 65 - 99 mg/dL  Glucose, capillary     Status: Abnormal   Collection Time: 01/18/17  4:38 PM  Result Value Ref Range   Glucose-Capillary 129 (H) 65 - 99 mg/dL  Glucose, capillary     Status: Abnormal    Collection Time: 01/18/17  8:21 PM  Result Value Ref Range   Glucose-Capillary 160 (H) 65 - 99 mg/dL   Comment 1 Notify RN    Comment 2 Document in Chart   Renal function panel     Status: Abnormal   Collection Time: 01/19/17  4:12 AM  Result Value Ref Range   Sodium 133 (L) 135 - 145 mmol/L   Potassium 4.3 3.5 - 5.1 mmol/L   Chloride 97 (L) 101 - 111 mmol/L   CO2 28 22 - 32 mmol/L   Glucose, Bld 148 (H) 65 - 99 mg/dL   BUN 70 (H) 6 - 20 mg/dL   Creatinine, Ser 3.44 (H) 0.44 - 1.00 mg/dL   Calcium 6.6 (L) 8.9 - 10.3 mg/dL   Phosphorus 1.8 (L) 2.5 - 4.6 mg/dL   Albumin 2.3 (L) 3.5 - 5.0 g/dL   GFR calc non Af Amer 13 (L) >60 mL/min   GFR calc Af Amer 15 (L) >60 mL/min    Comment: (NOTE) The eGFR has been calculated using the CKD EPI equation. This calculation has not been validated in all clinical situations. eGFR's persistently <60 mL/min signify possible Chronic Kidney Disease.    Anion gap 8 5 - 15  CBC     Status: Abnormal   Collection Time: 01/19/17  4:12 AM  Result Value Ref Range   WBC 11.6 (H) 4.0 - 10.5 K/uL   RBC 2.19 (L) 3.87 - 5.11 MIL/uL   Hemoglobin 6.1 (LL) 12.0 - 15.0 g/dL    Comment: RESULT REPEATED AND VERIFIED CRITICAL RESULT CALLED TO, READ BACK BY AND VERIFIED WITH: BULLOCK,T AT 0630 BY HUFFINES,S ON 01/19/17.    HCT 20.2 (L) 36.0 - 46.0 %   MCV 92.2 78.0 - 100.0 fL   MCH 27.9 26.0 - 34.0 pg   MCHC 30.2 30.0 - 36.0 g/dL   RDW 15.1 11.5 - 15.5 %   Platelets 212 150 - 400 K/uL  Glucose, capillary     Status: Abnormal   Collection Time: 01/19/17  7:59 AM  Result Value Ref Range   Glucose-Capillary 150 (H) 65 - 99 mg/dL   Comment 1 Notify RN    Comment 2 Document in  Chart   Type and screen [facility]     Status: None (Preliminary result)   Collection Time: 01/19/17  8:56 AM  Result Value Ref Range   ABO/RH(D) A NEG    Antibody Screen NEG    Sample Expiration 01/22/2017    Unit Number [id number]    Blood Component Type RED CELLS,LR     Unit division 00    Status of Unit ISSUED    Transfusion Status OK TO TRANSFUSE    Crossmatch Result Compatible    Unit Number [id number]    Blood Component Type RED CELLS,LR    Unit division 00    Status of Unit ALLOCATED    Transfusion Status OK TO TRANSFUSE    Crossmatch Result Compatible   Prepare RBC     Status: None   Collection Time: 01/19/17  8:56 AM  Result Value Ref Range   Order Confirmation ORDER PROCESSED BY BLOOD BANK   Glucose, capillary     Status: None   Collection Time: 01/19/17 11:09 AM  Result Value Ref Range   Glucose-Capillary 95 65 - 99 mg/dL   Comment 1 Notify RN    Comment 2 Document in Chart      Lipid Panel     Component Value Date/Time   CHOL 132 10/14/2016 0611   TRIG 247 (H) 10/14/2016 0611   HDL 26 (L) 10/14/2016 0611   CHOLHDL 5.1 10/14/2016 0611   VLDL 49 (H) 10/14/2016 0611   LDLCALC 57 10/14/2016 0611     Lab Results  Component Value Date   HGBA1C 5.7 (H) 10/15/2016   HGBA1C 5.8 (H) 10/13/2016   HGBA1C 6.0 (H) 07/26/2013     Lab Results  Component Value Date   LDLCALC 57 10/14/2016   CREATININE 3.44 (H) 01/19/2017     HPI :  Kelli [name] 66 year old female with a history of stroke with residual left hemiparesis, CKD stage III, essential hypertension, diabetes mellitus, peripheral vascular disease presentedwith 1 day history of shortness of breath, worsening leg edema, and chest discomfort. The patient also complains of a cough with clear sputum. She had subjective fevers and chills, but denied any headache, neck pain.The patient has chronic nausea and vomiting each morning which has been unchanged.  She complains of intermittent abdominal pain with loose stools. There is no dysuria, hematuria.U In ER: Hb 7.6. Chest x-ray showed pulmonary edema with BNP 966. The patientwas started on IV Lasix but remained fluid overloaded and her renal function continued to worsen. Repeat chest x-ray revealed pulmonary edema.  Nephrology was consulted to assist with management and possible initiation of dialysis. Dr. Constance Haw placed [place] on 01/11/17    HOSPITAL COURSE:  Acute on chronic renal failure/acute on chronic diastolic CHF - now ESRD dependent , ESRD initiated 01/11/17, Tunnelled cath placed 01/14/17 - appreciate management per nephrology service - not interested in palliative care and will like to pursuit full code. -Continue hemodialysis for volume control  -Patient is anuric at this moment.   -We will continue Toprol and hydralazine.   -Follow low-sodium diet, daily weights, strict intake and output.  -will discharge to SNF for rehabilitation; once outpatient hemodialysis service has been secure.Marland Kitchen   acute hypoxic resp failure -Secondary to fluid overload and COPD -much improved and stable. -continue HD now for volume management -completed steroids tapering -will continue PRN nebulizer.  Acute wheezing- no prior h/o COPD -improved and currently no wheezing and with good O2 sat on RA. -patient has completed steroids tapering  -  will continue PRN nebulizer treatment only  Leukocytosis -due to steroids most likely -no signs of infection -continue trending down appropriately.  Metabolic acidosis -on sodium bicarb tablets -this can be discontinue once fully stabilize on HD. -follow renal service inputs/recommendations.    HTN (hypertension) -will continue hydralazine and toprol -HD will also help controlling BP and volume.    Elevated troponin -most likely associated with CKD -patient denying CP and SOB  Sinus arrhythmia with occasional short runs of SVT - she has a loop recorded which was placed after a CVA in 2/15 - one short episode of SVT yesterday and one on 11/15 - d/c TID nebulizer and minimize use of albuterol - Dr Domenic Polite aware of the first episode - no further rec's currently -folllow electrolytes closely and continue b-blocker.    Symptomatic anemia -  hemolysis labs negative - likely due to chronic disease - Hb 7.1>6.1 -received 2 u PRBC on 11/8 , received another 2 units with HD 11/21 -will continue receiving IV iron and Epogen -will follow renal service recommendations -no signs of acute bleeding       Diabetes mellitus type 2 in obese  - continue SSI     Hx of Stroke (cryptogenic)with left hemiparesis  - as mentioned, has a loop recorder in place - will continue ASA - continue statins  - in agreement for SNF; but will like to discussed with husband.    Mixed hyperlipidemia -continue statin   Severe fatigue/ depression with flat affect -patient agrees to discussed with her husband and caregiver for final decision -understand short term rehabilitation at SNF is the best and safest discharge plans.  Occasional leg pain -stable and no major complaints today -will continue PRN  Tramadol      Discharge Exam:   Blood pressure (!) 103/52, pulse 71, temperature 98.8 F (37.1 C), temperature source Oral, resp. rate 16, height _0  (1.651 m), weight 85.1 kg (187 lb 9.8 oz), SpO2 96 %.  General exam: No fever, patient in no acute distress, denying chest pain or shortness of breath.  No nausea, no vomiting, able to follow commands appropriately.  Feeling weak and tired. Respiratory system: Good air movement bilaterally, no wheezing, no crackles.   Cardiovascular system: S1 and S2, no rubs, no gallops, no JVD appreciated; no murmurs. Gastrointestinal system: soft, nontender, nondistended, positive bowel sounds, no guarding.   Central nervous system: Alert, awake and oriented x3; patient able to move 4 limbs spontaneously even left lower extremity weakness was appreciated on exam.  No new neurologic deficit seen.  Cranial nerves II through XII grossly intact.    Extremities: No clubbing, no cyanosis, trace edema bilaterally appreciated on exam.   Psychiatry: Patient has remained with a flat affect and decision of no being  very conversant or interactive during examination.  No suicidal ideation or hallucinations.        SignedReyne Dumas 01/19/2017, 11:21 AM        Time spent >1 hour

## 2017-01-19 NOTE — Progress Notes (Signed)
Kelli [name]  MRN: [id number]  DOB/AGE: 66-Feb-1952 66 y.o.  Primary Care Physician:[name], [name], MD  Admit date: 01/06/2017  Chief Complaint:  Chief Complaint  Patient presents with  . Shortness of Breath  . Dysuria  . Chest Pain    S-Pt presented on  01/06/2017 with  Chief Complaint  Patient presents with  . Shortness of Breath  . Dysuria  . Chest Pain  .    Pt offers no new complaints .    Meds . epoetin alfa      . heparin      . aspirin EC  81 mg Oral Daily  . atorvastatin  40 mg Oral q1800  . budesonide (PULMICORT) nebulizer solution  0.5 mg Nebulization BID  . calcitRIOL  0.25 mcg Oral Daily  . calcium acetate  667 mg Oral TID WC  . enoxaparin (LOVENOX) injection  30 mg Subcutaneous Q24H  . epoetin (EPOGEN/PROCRIT) injection  8,000 Units Intravenous Q M,W,F-HD  . hydrALAZINE  50 mg Oral TID  . insulin aspart  0-15 Units Subcutaneous TID WC  . insulin aspart  0-5 Units Subcutaneous QHS  . loratadine  10 mg Oral Daily  . mouth rinse  15 mL Mouth Rinse BID  . metoprolol succinate  50 mg Oral Daily  . venlafaxine XR  150 mg Oral Q breakfast      Physical Exam: Vital signs in last 24 hours: Temp:  [98.8 F (37.1 C)-99.1 F (37.3 C)] 98.8 F (37.1 C) (11/21 0540) Pulse Rate:  [75-95] 95 (11/21 0540) Resp:  [18] 18 (11/21 0540) BP: (91-139)/(30-61) 91/30 (11/21 0540) SpO2:  [96 %-100 %] 96 % (11/21 0746) Weight:  [187 lb 9.8 oz (85.1 kg)] 187 lb 9.8 oz (85.1 kg) (11/21 0540) Weight change: -7.1 oz (-0.2 kg) Last BM Date: 01/18/17  Intake/Output from previous day: 11/20 0701 - 11/21 0700 In: 350 [P.O.:350] Out: 800 [Urine:800] No intake/output data recorded.   Physical Exam: General- pt is lethargic, but arousable Resp- No acute REsp distress, Rhonchi+ CVS- S1S2 regular in rate and rhythm GIT- BS+, soft, NT, ND EXT- 1+ LE Edema, no Cyanosis Access-Permacath  Lab Results: CBC    Component Value Date/Time   WBC 11.6 (H) 01/19/2017 0412   RBC 2.19 (L) 01/19/2017 0412   HGB 6.1 (LL) 01/19/2017 0412   HCT 20.2 (L) 01/19/2017 0412   PLT 212 01/19/2017 0412   MCV 92.2 01/19/2017 0412   MCH 27.9 01/19/2017 0412   MCHC 30.2 01/19/2017 0412   RDW 15.1 01/19/2017 0412   LYMPHSABS 1.2 10/13/2016 0352   MONOABS 0.8 10/13/2016 0352   EOSABS 0.3 10/13/2016 0352   BASOSABS 0.0 10/13/2016 0352       BMET Recent Labs    01/18/17 0738 01/19/17 0412  NA 135 133*  K 3.8 4.3  CL 98* 97*  CO2 30 28  GLUCOSE 128* 148*  BUN 30* 70*  CREATININE 2.79* 3.44*  CALCIUM 6.8* 6.6*   Creat trend 2018   5.0=>5.6=>5.86   In August admision4.5--5.0 2016   1.4--1.5 2015   1.3--1.7 2014   1.1--2.0 ( AKi)    Lab Results  Component Value Date   PTH 162 (H) 10/14/2016   PTH Comment 10/14/2016   CALCIUM 6.6 (L) 01/19/2017   CAION 1.14 04/08/2013   PHOS 1.8 (L) 01/19/2017     ANA Positive Anti dsDNA negative Complements normal ANCA negative M spike negative Free kappa/lamda chain ratio- 2.08  Alb  3.2 Corrected calcium  7.6+ 0.6=8.2  Impression: 1)Renal                 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  3)Anemia HGb not at goal (9--11) Pt is to Receive prbc. On Epo during HD  4)CKD Mineral-Bone Disorder PTH elevated. Secondary Hyperparathyroidism- present.    On calcitriol Phosphorus not at  goal. Calcium not at goal  5)DM PMD following  6)Electrolytes  Hyperkalemic     Now better  Hyponatremic   7)Acid base Co2 now at goal On po bicarb    Plan:  Will dialyze again today Will use 3k bath today Will try  to remove 2 liters if pt tolerates   Addendum Pt seen on HD Pt tolerating tx.  Livan Hires S 01/19/2017, 9:01 AM

## 2017-01-19 NOTE — Progress Notes (Signed)
2 units of blood Being transfused during dialysis procedure.

## 2017-01-20 ENCOUNTER — Encounter (HOSPITAL_COMMUNITY): Payer: Self-pay | Admitting: Gastroenterology

## 2017-01-20 ENCOUNTER — Inpatient Hospital Stay (HOSPITAL_COMMUNITY): Payer: 59

## 2017-01-20 DIAGNOSIS — Z538 Procedure and treatment not carried out for other reasons: Secondary | ICD-10-CM

## 2017-01-20 DIAGNOSIS — N181 Chronic kidney disease, stage 1: Secondary | ICD-10-CM

## 2017-01-20 DIAGNOSIS — G9341 Metabolic encephalopathy: Principal | ICD-10-CM

## 2017-01-20 LAB — CBC
HCT: 22.9 % — ABNORMAL LOW (ref 36.0–46.0)
HEMOGLOBIN: 7.3 g/dL — AB (ref 12.0–15.0)
MCH: 28.2 pg (ref 26.0–34.0)
MCHC: 31.9 g/dL (ref 30.0–36.0)
MCV: 88.4 fL (ref 78.0–100.0)
Platelets: 195 10*3/uL (ref 150–400)
RBC: 2.59 MIL/uL — AB (ref 3.87–5.11)
RDW: 15.7 % — ABNORMAL HIGH (ref 11.5–15.5)
WBC: 11.2 10*3/uL — ABNORMAL HIGH (ref 4.0–10.5)

## 2017-01-20 LAB — GLUCOSE, CAPILLARY
GLUCOSE-CAPILLARY: 126 mg/dL — AB (ref 65–99)
GLUCOSE-CAPILLARY: 189 mg/dL — AB (ref 65–99)
GLUCOSE-CAPILLARY: 96 mg/dL (ref 65–99)
GLUCOSE-CAPILLARY: 111 mg/dL — AB (ref 65–99)

## 2017-01-20 MED ORDER — POLYETHYLENE GLYCOL 3350 17 G PO PACK
17.0000 g | PACK | Freq: Once | ORAL | Status: AC
  Administered 2017-01-20: 17 g via ORAL
  Filled 2017-01-20: qty 1

## 2017-01-20 MED ORDER — POLYETHYLENE GLYCOL 3350 17 G PO PACK
17.0000 g | PACK | ORAL | Status: AC
  Administered 2017-01-20 (×5): 17 g via ORAL
  Filled 2017-01-20 (×2): qty 1

## 2017-01-20 MED ORDER — IPRATROPIUM-ALBUTEROL 0.5-2.5 (3) MG/3ML IN SOLN
3.0000 mL | Freq: Four times a day (QID) | RESPIRATORY_TRACT | Status: DC
  Administered 2017-01-20 – 2017-01-27 (×24): 3 mL via RESPIRATORY_TRACT
  Filled 2017-01-20 (×25): qty 3

## 2017-01-20 NOTE — Consult Note (Signed)
Referring Provider: No ref. provider found Primary Care Physician:  Manon Hilding, MD Primary Gastroenterologist:  DR. Wilford Corner  Reason for Consultation:  TRANSFUSION DEPENDENT ANEMIA   Impression: ADMITTED WITH SOB DUE TO ANEMIA/CHF AND HAS HAD A TRANSFUSION DEPENDENT ANEMIA SINCE AUG 2018. DIFFERENTIAL DIAGNOSIS INCLUDES: PUD, AVMs,  H PYLORI GASTRITIS, COLON POLYPS, LESS LIKELY DIEULAFOY'S LESION,  GASTRIC OR COLON CANCER.  Plan: 1. BID PPI 2. FULL LIQUID DIET 3. MIRALAX PREP NOV 22 AND 23. PLAN TCS/EGD ON NOV 24. DISCUSSED PROCEDURE, BENEFITS, & RISKS: < 1% chance of medication reaction, bleeding, perforation, or rupture of spleen/liver. 4. NEBS Q6H AND PRN LASIX TO MANAGE PULMONARY EDEMA 5. ASA OK IN SETTING OF POSSIBLE ACUTE CVA AND NO EVIDENCE FOR ACTIVE GI BLEED.  GREATER THAN 50% WAS SPENT REVIEWING CVE(9381 TO PRESENT), COUNSELING & COORDINATION OF CARE WITH THE PATIENT: DISCUSSED DIFFERENTIAL DIAGNOSIS, PROCEDURE, BENEFITS, RISKS, AND MANAGEMENT OF TRANSFUSION DEPENDENT ANEMIA. TOTAL ENCOUNTER TIME: 110 MINS.        HPI:   LAST TCS > 10 YRS AGO AND HAD ONE POLYP REMOVED  AT [facility]. LAST EGD 2016: ESOPHAGITIS/DUODENAL ULCERS/H PYLORI SEROLOGY NEGATIVE AND PT HAD BEEN TAKING ASA/PLAVIX/NAPROXEN. ASA WAS STOPPED IN 2016. PT WAS TAKING PLAVIX AND DOING WELL UNTIL AUG 2018.    PT ADMITTED FOR CHEST PAIN IN AUG 2018 AND HB WAS 6.3 ON PLAVIX. IN AUG 2018 FERRITIN 67 AND PT RECEIVED 2u pRBCs AND Hb INCREASED TO 9.6 AND ON DAY OF D/C HER HB WAS DOWN TO Hb 7.7. SHE WAS DISCHARGED ON PLAVIX?Marland Kitchen PT AND HUSBAND THINK SHE WAS NOT TAKING PLAVIX BUT IT IS ON THE DISCHARGE SUMMARY.  SUBSEQUENT Hb SEP 2018 7.1-7.4. IN OCT 2018 Hb 9.8-13.7. STARTING IN SEP 2018 WAS RECEIVING  PROCRIT, AND B12 SHOTS(AUG 15 B12 213) AS AN OUTPATIENT. SHE RECEIVED A pRBCs x2 IN SEP 2018. NO PROCRIT Dec 28 2016 BECAUSE Hb NOTED TO BE 13.7.  PT PRESENTED TO ED WITH SOB NOV 8 DUE TO CHF/ACUTE RENAL FAILURE. PT  HAD BLACK STOOL x2 ONE WEEK PRIOR TO ADMISSION BUT THEY THOUGHT IT MIGHT'VE BEEN DUE TO MEDS. ON ADMISSION NO ASA,PLAVIX, OR PPI LISTED ON PTA MEDS. PLAN FOR D/C NOV 21 BUT PT BECAME SOMNOLENT AND DISCHARGE CANCELED. SPOKE WITH PHARMACY PT HAS BEEN ON ASA DAILY SINCE ADMISSION, BUT NOT ON A PPI.  ON NOV 8 Hb 7.6 AND RECEIVED 2u PRBCs Hb INCREASED UP TO 10.1-10.6. RECEIVING HEPARIN WITH DIALYSIS AND LOVENOX FOR DVT PROPHYLAXIS. HB STARTED TRENDING DOWN NOV 16 TO Hb 6.1 YESTERDAY.  RECEIVED 2u pRBCs ON NOV 21 AND NOW Hb 7.3. HUSBAND STATES PT HAD ONE COFFEE COLORED STOOL YESTERDAY. HE DENIES BLACK STOOL.   PT DENIES FEVER, CHILLS, HEMATOCHEZIA, HEMATEMESIS, vomiting, CHEST PAIN, SHORTNESS OF BREATH,  CHANGE IN BOWEL IN HABITS, abdominal pain, problems swallowing, problems with sedation, OR heartburn or indigestion.   Past Medical History:  Diagnosis Date  . Carotid artery occlusion    Occluded RICA, status post left CEA  August 2014 - Dr. Donnetta Hutching  . Cerebral infarction [facility]) Aug 2014   Bihemispheric watershed infarcts  . Cerebral infarction involving left cerebellar artery [facility]) Feb 2015  . CKD (chronic kidney disease) stage 3, GFR 30-59 ml/min (HCC)   . Closed dislocation of left humerus 07/26/2013  . DM (diabetes mellitus), type 2 ([place])   . Essential hypertension, benign   . Fibromyalgia   . Mixed hyperlipidemia   . Multiple gastric ulcers   . Urinary incontinence  Past Surgical History:  Procedure Laterality Date  . COMBINED HYSTERECTOMY VAGINAL W/ MMK / A&P REPAIR  1981  . ENDARTERECTOMY Left 10/06/2012   Procedure: Carotid Endarterectomy with Finesse patch angioplasty;  Surgeon: [name], MD;  Location: [facility];  Service: Vascular;  Laterality: Left;  . ESOPHAGOGASTRODUODENOSCOPY N/A 04/26/2014   Procedure: ESOPHAGOGASTRODUODENOSCOPY (EGD);  Surgeon: Lear Ng, MD;  Location[facility] ENDOSCOPY;  Service: Endoscopy;  Laterality: N/A;  . IR FLUORO GUIDE CV LINE LEFT  01/14/2017  .  IR US GUIDE VASC ACCESS LEFT  01/14/2017  . LOOP RECORDER IMPLANT  04/16/13   MDT LinQ implanted for cryptogenic stroke  . [name] WITHOUT CARDIOVERSION N/A 04/16/2013   Procedure: TRANSESOPHAGEAL ECHOCARDIOGRAM ([name]);  Surgeon: Josue Hector, MD;  Location: [facility] ENDOSCOPY;  Service: Cardiovascular;  Laterality: N/A;  . URETHRAL DILATION  1980's  . VAGINAL HYSTERECTOMY  1981   "partial" (10/04/2012)    Prior to Admission medications   Medication Sig Start Date End Date Taking? Authorizing Provider  amLODipine (NORVASC) 10 MG tablet Take 10 mg by mouth daily.   Yes [provider]  atorvastatin (LIPITOR) 40 MG tablet Take 1 tablet (40 mg total) by mouth daily at 6 PM. 10/20/16  Yes McClung, Kimberlee Nearing, MD  calcitRIOL (ROCALTROL) 0.25 MCG capsule Take 1 capsule (0.25 mcg total) by mouth daily. 10/20/16  Yes Cherene Altes, MD  hydrALAZINE (APRESOLINE) 50 MG tablet Take 1 tablet (50 mg total) by mouth 3 (three) times daily. 10/20/16  Yes Cherene Altes, MD  nitrofurantoin (MACRODANTIN) 100 MG capsule Take 100 mg 2 (two) times daily by mouth.   Yes [provider]  potassium chloride SA (K-DUR,KLOR-CON) 20 MEQ tablet Take 1 tablet (20 mEq total) by mouth daily. Patient taking differently: Take 40 mEq daily by mouth.  10/20/16  Yes Cherene Altes, MD  sodium bicarbonate 650 MG tablet Take 1 tablet (650 mg total) by mouth 3 (three) times daily. 10/20/16  Yes Cherene Altes, MD  torsemide (DEMADEX) 20 MG tablet Take 2 tablets (40 mg total) by mouth daily. 10/20/16  Yes Cherene Altes, MD  venlafaxine XR (EFFEXOR-XR) 150 MG 24 hr capsule Take 150 mg by mouth daily with breakfast.   Yes [provider]  aspirin EC 81 MG EC tablet Take 1 tablet (81 mg total) by mouth daily. 01/20/17   Reyne Dumas, MD  insulin aspart (NOVOLOG) 100 UNIT/ML injection Inject 0-15 Units into the skin 3 (three) times daily with meals. 01/19/17   Reyne Dumas, MD  ipratropium-albuterol  (DUONEB) 0.5-2.5 (3) MG/3ML SOLN Take 3 mLs by nebulization every 6 (six) hours as needed. 01/19/17   Reyne Dumas, MD  loratadine (CLARITIN) 10 MG tablet Take 1 tablet (10 mg total) by mouth daily. 01/20/17   Reyne Dumas, MD  metoprolol succinate (TOPROL-XL) 100 MG 24 hr tablet Take 1 tablet (100 mg total) by mouth daily. Take with or immediately following a meal. Patient not taking: Reported on 01/06/2017 10/20/16   Cherene Altes, MD  traMADol (ULTRAM) 50 MG tablet Take 1 tablet (50 mg total) by mouth every 12 (twelve) hours as needed for moderate pain. 01/19/17   Reyne Dumas, MD    Current Facility-Administered Medications  Medication Dose Route Frequency Provider Last Rate Last Dose  . 0.9 %  sodium chloride infusion  10 mL/hr Intravenous Once Fredia Sorrow, MD      . 0.9 %  sodium chloride infusion  100 mL Intravenous PRN Fran Lowes,  MD      . 0.9 %  sodium chloride infusion  100 mL Intravenous PRN Befekadu, Eli Phillips, MD      . 0.9 %  sodium chloride infusion  100 mL Intravenous PRN Befekadu, Belayenh, MD      . 0.9 %  sodium chloride infusion  100 mL Intravenous PRN Fran Lowes, MD      . acetaminophen (TYLENOL) tablet 650 mg  650 mg Oral Q4H PRN Barton Dubois, MD      . alteplase (CATHFLO ACTIVASE) injection 2 mg  2 mg Intracatheter Once PRN Fran Lowes, MD      . alum & mag hydroxide-simeth (MAALOX/MYLANTA) 200-200-20 MG/5ML suspension 30 mL  30 mL Oral Q6H PRN Tat, David, MD   30 mL at 01/17/17 1408  . aspirin EC tablet 81 mg  81 mg Oral Daily Karmen Bongo, MD   81 mg at 01/20/17 0830  . atorvastatin (LIPITOR) tablet 40 mg  40 mg Oral q1800 Karmen Bongo, MD   40 mg at 01/19/17 1733  . budesonide (PULMICORT) nebulizer solution 0.5 mg  0.5 mg Nebulization BID Tat, David, MD   0.5 mg at 01/20/17 0752  . calcitRIOL (ROCALTROL) capsule 0.25 mcg  0.25 mcg Oral Daily Karmen Bongo, MD   0.25 mcg at 01/20/17 0831  . epoetin alfa (EPOGEN,PROCRIT)  injection 8,000 Units  8,000 Units Intravenous Q M,W,F-HD Fran Lowes, MD      . heparin injection 1,000 Units  1,000 Units Dialysis PRN Fran Lowes, MD   1,000 Units at 01/19/17 1104  . heparin injection 1,000 Units  1,000 Units Dialysis PRN Fran Lowes, MD      . hydrALAZINE (APRESOLINE) tablet 50 mg  50 mg Oral TID Debbe Odea, MD   50 mg at 01/20/17 0829  . insulin aspart (novoLOG) injection 0-15 Units  0-15 Units Subcutaneous TID WC Karmen Bongo, MD   2 Units at 01/20/17 0830  . insulin aspart (novoLOG) injection 0-5 Units  0-5 Units Subcutaneous QHS Karmen Bongo, MD   2 Units at 01/10/17 2157  . ipratropium-albuterol (DUONEB) 0.5-2.5 (3) MG/3ML nebulizer solution 3 mL  3 mL Nebulization Q6H PRN Debbe Odea, MD   3 mL at 01/16/17 2024  . lidocaine (PF) (XYLOCAINE) 1 % injection 5 mL  5 mL Intradermal PRN Fran Lowes, MD      . lidocaine-prilocaine (EMLA) cream 1 application  1 application Topical PRN Fran Lowes, MD      . loratadine (CLARITIN) tablet 10 mg  10 mg Oral Daily Jani Gravel, MD   10 mg at 01/20/17 0829  . MEDLINE mouth rinse  15 mL Mouth Rinse BID Orson Eva, MD   15 mL at 01/18/17 2113  . metoprolol succinate (TOPROL-XL) 24 hr tablet 50 mg  50 mg Oral Daily Debbe Odea, MD   50 mg at 01/20/17 0830  . ondansetron (ZOFRAN) injection 4 mg  4 mg Intravenous Q6H PRN Reyne Dumas, MD      . ondansetron (ZOFRAN) injection 4 mg  4 mg Intravenous Q6H PRN Reyne Dumas, MD   4 mg at 01/19/17 1643  . pantoprazole (PROTONIX) injection 40 mg  40 mg Intravenous Q12H Reyne Dumas, MD   40 mg at 01/20/17 0830  . pentafluoroprop-tetrafluoroeth (GEBAUERS) aerosol 1 application  1 application Topical PRN Fran Lowes, MD        Allergies as of 01/06/2017 - Review Complete 01/06/2017  Allergen Reaction Noted  . Hydrochlorothiazide Nausea And Vomiting 10/03/2012    Family History  Problem Relation Age of Onset  . Diabetes Mother   .  Diabetes Father   . Hypertension Sister   . Diabetes Brother   . Seizures Son   . Kidney disease Maternal Grandmother   . Hypertension Maternal Grandmother   . Heart disease Maternal Grandfather   . Diabetes Paternal Grandmother   . Heart disease Paternal Grandfather    Social History   Socioeconomic History  . Marital status: Married    Spouse name: None  . Number of children: None  . Years of education: None  . Highest education level: None  Social Needs  . Financial resource strain: None  . Food insecurity - worry: None  . Food insecurity - inability: None  . Transportation needs - medical: None  . Transportation needs - non-medical: None  Occupational History  . None  Tobacco Use  . Smoking status: Former Smoker    Packs/day: 1.00    Years: 44.00    Pack years: 44.00    Types: Cigarettes    Last attempt to quit: 10/03/2012    Years since quitting: 4.3  . Smokeless tobacco: Never Used  Substance and Sexual Activity  . Alcohol use: No    Alcohol/week: 0.0 oz  . Drug use: No  . Sexual activity: Yes    Birth control/protection: Surgical  Other Topics Concern  . 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   Review of Systems: PER HPI OTHERWISE ALL SYSTEMS ARE NEGATIVE.  Vitals: Blood pressure (!) 159/72, pulse (!) 102, temperature 98.7 F (37.1 C), temperature source Oral, resp. rate 20, height 5\' 5"  (1.651 m), weight 186 lb 11.2 oz (84.7 kg), SpO2 96 %.  Physical Exam: General:   Alert,  pleasant and cooperative in NAD Head:  Normocephalic and atraumatic. Eyes:  Sclera clear, no icterus.   Conjunctiva pink. Mouth:  No lesions, dentition normal. Neck:  Supple; no masses. Lungs:  COURSE BREATH SOUNDS BILATERALLY, FAIR AIR MOVEMENT,  No wheezes. No acute distress. COMPLETES FULL SENTENCES Heart:  Regular rate and rhythm; no murmurs. Abdomen:  Soft, nontender and  nondistended. No masses noted. Normal bowel sounds, without guarding, and without rebound.   Msk:  UNABLE TO MOVE LEFT SIDE. Extremities:  Without edema. Neurologic:   GOOD EYE CONTACT, RESPONDS APPROPRIATELY TO QUESTIONS, NO NEW FOCAL DEFICITS Cervical Nodes:  No significant cervical adenopathy. Psych:  Alert and cooperative. Normal mood and FLAT affect.   Lab Results: Recent Labs    01/18/17 0738 01/19/17 0412 01/20/17 0457  WBC 9.7 11.6* 11.2*  HGB 7.1* 6.1* 7.3*  HCT 23.1* 20.2* 22.9*  PLT 205 212 195   BMET Recent Labs    01/18/17 0738 01/19/17 0412  NA 135 133*  K 3.8 4.3  CL 98* 97*  CO2 30 28  GLUCOSE 128* 148*  BUN 30* 70*  CREATININE 2.79* 3.44*  CALCIUM 6.8* 6.6*   LFT Recent Labs    01/19/17 0412  ALBUMIN 2.3*     Studies/Results: CT HEAD: NOV 21-Compared to 2015 there is patchy low-density in the left thalamus with mild expansion. This may reflect a recent infarct if there is associated deficit. MRI could characterize if clinically appropriate. Advanced chronic ischemic injury. 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 pCXR NOV 22: Left jugular dual-lumen catheter in good position. Improvement in bilateral edema.    LOS: 14 days   Nayab Aten  Zyair Rhein  01/20/2017, 11:26 AM

## 2017-01-20 NOTE — Progress Notes (Signed)
Dr. Allyson Sabal paged and made aware that MRI would not be completed until 11/23 due to holiday and MRI staff unavailable.

## 2017-01-20 NOTE — Progress Notes (Signed)
PROGRESS NOTE    Kelli [name]   QQP:[id number]  DOB: [birth year]  DOA: 01/06/2017 PCP: Manon Hilding, MD   Brief Narrative:  Kelli [name] 66 year old female with a history of stroke with residual left hemiparesis, CKD stage III, essential hypertension, diabetes mellitus, peripheral vascular disease presentedwith 1 day history of shortness of breath, worsening leg edema, and chest discomfort. The patient also complains of a cough with clear sputum. She had subjective fevers and chills, but denied any headache, neck pain.The patient has chronic nausea and vomiting each morning which has been unchanged.  She complains of intermittent abdominal pain with loose stools. There is no dysuria, hematuria.U In ER: Hb 7.6. Chest x-ray showed pulmonary edema with BNP 966. The patientwas started on IV Lasix but remained fluid overloaded and her renal function continued to worsen. Repeat chest x-ray revealed pulmonary edema. Nephrology was consulted to assist with management and possible initiation of dialysis.  Dr. Constance Haw placed VasCath on 01/11/17.   Subjective: More awake and alert than yesterday Had one black tarry stool yesterday , none today BP improved after receiving blood 11/21  Assessment & Plan: Acute on chronic renal failure/acute on chronic diastolic CHF - now ESRD dependent , ESRD initiated 01/11/17,Tunnelled cath placed 01/14/17 - appreciate management per nephrology service - not interested in palliative care and will like to pursuit full code. -Continue hemodialysis for volume control -Patient is anuric at this moment. -We will continue Toprol and hydralazine. -Follow low-sodium diet, daily weights, strict intake and output. -will discharge to SNF for rehabilitation;once outpatient hemodialysis service has been secure..  Melena Concern is that hg did not correct much after 2 units Went from 6.1>7.3 Hx of duodenal ulcers on EGD in 2016 She was taken off asa  and plavix at some point after that due to GI bleeding  She will get transfused again tomorrow if hg <7.0 , or if bleeding recurs tonight She has been started on iv PPI hemolysis labs negative - likely due to chronic disease -received 2 u PRBC on 11/8 , received another 2 units with HD 11/21 -willcontinue receivingIV iron and Epogen GI consult for possible EGD if MRI is negative for acute CVA   Hx of Stroke (cryptogenic)with left hemiparesis  - as mentioned, has a loop recorder in place - now on asa , did not take asa at home per husband due to GI BLEED hx  - continue statins  CT head suggested recent infarct  Ordered MRI to r/o acute CVA   acute hypoxic resp failure -Secondary tofluid overload and COPD -muchimprovedand stable. -continue HD now for volume management -completed steroids tapering -willcontinue PRN nebulizer.  Acute wheezing- no prior h/o COPD -improvedand currently no wheezing and with good O2 sat on RA. -patient has completed steroids tapering  -will continue PRN nebulizer treatment only  Leukocytosis -due to steroids most likely -no signs of infection -continuetrending down appropriately.  Metabolic acidosis -on sodium bicarb tablets -this can be discontinueoncefullystabilize on HD. -follow renal service inputs/recommendations.  HTN (hypertension) -will continuehydralazine and toprol -HD will also help controlling BP and volume.  Elevated troponin -most likely associated with CKD -patient denying CP and SOB  Sinus arrhythmia with occasional short runs of SVT - she has a loop recorded which was placed after a CVA in 2/15 - one short episode of SVT yesterday and one on 11/15 - d/c TID nebulizer and minimize use of albuterol - Dr Domenic Polite aware of the first episode - no further rec's currently -  folllow electrolytes closely and continue b-blocker.     Diabetes mellitus type 2 in obese  - continue SSI   Mixed  hyperlipidemia -continue statin   Severe fatigue/ depression with flat affect -patient agrees to discussed with her husband and caregiver for final decision -understand short term rehabilitation at SNF is the best and safest discharge plans.  Occasional leg pain -stable and no major complaints today -will continue PRN  Tramadol      DVT prophylaxis: Lovenox Code Status: Full code Family Communication: sister at bedside Disposition Plan: likely dc on mOnday  Consultants:   nephro  Palliative care  IR  Procedures:   HD cath by surgery  Tunneled dialysis cath per IR  2 D ECHO Left ventricle: The cavity size was normal. Systolic function was   normal. The estimated ejection fraction was in the range of 55%   to 60%. Wall motion was normal; there were no regional wall   motion abnormalities. The study is not technically sufficient to   allow evaluation of LV diastolic function due to tachycardia and   E/a fusion. Moderate to severe left ventricular hypertrophy. - Aortic valve: Trileaflet; mildly thickened, mildly calcified   leaflets. Sclerosis without stenosis. There was no stenosis. - Mitral valve: Calcified annulus. - Left atrium: The atrium was mildly dilated. - Atrial septum: No defect or patent foramen ovale was identified.  Antimicrobials:  Anti-infectives (From admission, onward)   Start     Dose/Rate Route Frequency Ordered Stop   01/14/17 1415  ceFAZolin (ANCEF) IVPB 2g/100 mL premix     2 g 200 mL/hr over 30 Minutes Intravenous To Radiology 01/14/17 1413 01/14/17 1455      Objective: Vitals:   01/19/17 1950 01/19/17 2116 01/20/17 0442 01/20/17 0753  BP:  (!) 168/60 (!) 159/72   Pulse:  (!) 106 (!) 102   Resp:  18 20   Temp:  98.8 F (37.1 C) 98.7 F (37.1 C)   TempSrc:  Oral Oral   SpO2: 97% 100% 98% 96%  Weight:   84.7 kg (186 lb 11.2 oz)   Height:        Intake/Output Summary (Last 24 hours) at 01/20/2017 1052 Last data filed at  01/20/2017 0917 Gross per 24 hour  Intake 1610 ml  Output 1570 ml  Net 40 ml   Filed Weights   01/19/17 0540 01/19/17 0845 01/20/17 0442  Weight: 85.1 kg (187 lb 9.8 oz) 85.1 kg (187 lb 9.8 oz) 84.7 kg (186 lb 11.2 oz)   Examination: General exam:  No fever, patient in no acute distress, denying chest pain or shortness of breath.  No nausea, no vomiting, able to follow commands appropriately.  Feeling weak and tired. Respiratory system: Good air movement bilaterally, no wheezing, no crackles.   Cardiovascular system:  S1 and S2, no rubs, no gallops, no JVD appreciated; no murmurs. Gastrointestinal system:  soft, nontender, nondistended, positive bowel sounds, no guarding.   Central nervous system:  Alert, awake and oriented x3; patient able to move 4 limbs spontaneously even left lower extremity weakness was appreciated on exam.  No new neurologic deficit seen.  Cranial nerves II through XII grossly intact.    Extremities: No clubbing, no cyanosis, trace edema bilaterally appreciated on exam.   Psychiatry:   Patient has remained with a flat affect and decision of no being very conversant or interactive during examination.  No suicidal ideation or hallucinations.  Data Reviewed: I have personally reviewed following labs and imaging studies  CBC: Recent Labs  Lab 01/15/17 0657 01/17/17 0900 01/18/17 0738 01/19/17 0412 01/20/17 0457  WBC 16.8* 12.6* 9.7 11.6* 11.2*  HGB 8.7* 7.4* 7.1* 6.1* 7.3*  HCT 27.7* 23.5* 23.1* 20.2* 22.9*  MCV 89.9 90.0 90.9 92.2 88.4  PLT 213 205 205 212 093   Basic Metabolic Panel: Recent Labs  Lab 01/14/17 0506 01/15/17 0657 01/16/17 0644 01/18/17 0738 01/19/17 0412  NA 133*  133* 136 136 135 133*  K 3.5  3.5 3.7 3.6 3.8 4.3  CL 96*  96* 98* 99* 98* 97*  CO2 26  26 26 27 30 28   GLUCOSE 124*  124* 127* 127* 128* 148*  BUN 80*  82* 54* 63* 30* 70*  CREATININE 4.33*  4.38* 3.54* 4.02* 2.79* 3.44*  CALCIUM 6.9*  6.9* 6.9* 6.7* 6.8* 6.6*    PHOS 4.8*  4.8* 3.8 4.0 2.2* 1.8*   GFR: Estimated Creatinine Clearance: 17.3 mL/min (A) (by C-G formula based on SCr of 3.44 mg/dL (H)).   Liver Function Tests: Recent Labs  Lab 01/14/17 0506 01/15/17 0657 01/16/17 0644 01/18/17 0738 01/19/17 0412  ALBUMIN 2.6*  2.5* 2.6* 2.5* 2.4* 2.3*   Coagulation Profile: Recent Labs  Lab 01/13/17 1142  INR 1.05   CBG: Recent Labs  Lab 01/19/17 0759 01/19/17 1109 01/19/17 1650 01/19/17 2119 01/20/17 0733  GLUCAP 150* 95 106* 121* 126*   Urine analysis:    Component Value Date/Time   COLORURINE YELLOW 01/10/2017 1150   APPEARANCEUR CLOUDY (A) 01/10/2017 1150   LABSPEC 1.011 01/10/2017 1150   PHURINE 7.0 01/10/2017 1150   GLUCOSEU NEGATIVE 01/10/2017 1150   HGBUR NEGATIVE 01/10/2017 1150   BILIRUBINUR NEGATIVE 01/10/2017 1150   KETONESUR NEGATIVE 01/10/2017 1150   PROTEINUR 100 (A) 01/10/2017 1150   UROBILINOGEN 0.2 04/25/2014 2315   NITRITE NEGATIVE 01/10/2017 1150   LEUKOCYTESUR MODERATE (A) 01/10/2017 1150    Radiology Studies: Ct Head Wo Contrast  Result Date: 01/19/2017 CLINICAL DATA:  Unexplained altered level of consciousness. EXAM: CT HEAD WITHOUT CONTRAST TECHNIQUE: Contiguous axial images were obtained from the base of the skull through the vertex without intravenous contrast. COMPARISON:  Brain MRI 09/17/2013 FINDINGS: Brain: Confluent low-density in the cerebral white matter consistent with chronic small vessel ischemia. Remote moderate to large right PCA distribution affecting the inferior temporal and occipital lobes. Small remote high right posterior frontal and left parietal cortex infarcts. Patchy indistinct bilateral deep gray nuclei consistent with chronic small vessel ischemic injury. Remote right pontine infarct. Remote small vessel left cerebellar infarct. The left thalamus is asymmetrically low-density compared to the right common mildly expanded. This could reflect a recent infarct. No hemorrhage,  hydrocephalus, or shift. Vascular: Atherosclerotic calcification. Small right ICA at the skullbase. There was right ICA occlusion on prior exam. No hyperdense vessel Skull: No acute or aggressive finding. Sinuses/Orbits: No acute finding IMPRESSION: 1. Compared to 2015 there is patchy low-density in the left thalamus with mild expansion. This may reflect a recent infarct if there is associated deficit. MRI could characterize if clinically appropriate. 2. Advanced chronic ischemic injury as noted above. 3. 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 Electronically Signed   By: Monte Fantasia M.D.   On: 01/19/2017 15:50   US Venous Img Lower Bilateral  Result Date: 01/19/2017 CLINICAL DATA:  Bilateral lower extremity swelling. EXAM: BILATERAL LOWER EXTREMITY VENOUS DOPPLER ULTRASOUND TECHNIQUE: Gray-scale sonography with graded compression, as well as color Doppler and duplex ultrasound were performed to evaluate the  lower extremity deep venous systems from the level of the common femoral vein and including the common femoral, femoral, profunda femoral, popliteal and calf veins including the posterior tibial, peroneal and gastrocnemius veins when visible. The superficial great saphenous vein was also interrogated. Spectral Doppler was utilized to evaluate flow at rest and with distal augmentation maneuvers in the common femoral, femoral and popliteal veins. COMPARISON:  10/13/2016 FINDINGS: RIGHT LOWER EXTREMITY Common Femoral Vein: No evidence of thrombus. Normal compressibility, respiratory phasicity and response to augmentation. Saphenofemoral Junction: No evidence of thrombus. Normal compressibility and flow on color Doppler imaging. Profunda Femoral Vein: No evidence of thrombus. Normal compressibility and flow on color Doppler imaging. Femoral Vein: No evidence of thrombus. Normal compressibility, respiratory phasicity and response to augmentation. Popliteal Vein: No evidence of  thrombus. Normal compressibility, respiratory phasicity and response to augmentation. Calf Veins: No evidence of thrombus. Normal compressibility and flow on color Doppler imaging. Superficial Great Saphenous Vein: No evidence of thrombus. Normal compressibility. Venous Reflux:  None. Other Findings:  None. LEFT LOWER EXTREMITY Common Femoral Vein: No evidence of thrombus. Normal compressibility, respiratory phasicity and response to augmentation. Saphenofemoral Junction: No evidence of thrombus. Normal compressibility and flow on color Doppler imaging. Profunda Femoral Vein: No evidence of thrombus. Normal compressibility and flow on color Doppler imaging. Femoral Vein: No evidence of thrombus. Normal compressibility, respiratory phasicity and response to augmentation. Popliteal Vein: No evidence of thrombus. Normal compressibility, respiratory phasicity and response to augmentation. Calf Veins: No evidence of thrombus. Normal compressibility and flow on color Doppler imaging. Superficial Great Saphenous Vein: No evidence of thrombus. Normal compressibility. Venous Reflux:  None. Other Findings:  None. IMPRESSION: No evidence of deep venous thrombosis. Electronically Signed   By: Rolm Baptise M.D.   On: 01/19/2017 15:35   Dg Chest Port 1 View  Result Date: 01/20/2017 CLINICAL DATA:  Altered mental status short of breath EXAM: PORTABLE CHEST 1 VIEW COMPARISON:  01/09/2017 FINDINGS: Interval placement of left jugular dual-lumen catheter with tip in the SVC. Right jugular catheter tip in the SVC unchanged. No pneumothorax Interval improvement in diffuse bilateral airspace disease. Mild vascular congestion remains with left lower lobe atelectasis and small left effusion IMPRESSION: Left jugular dual-lumen catheter in good position. Improvement in bilateral edema. Electronically Signed   By: Franchot Gallo M.D.   On: 01/20/2017 08:41      Scheduled Meds: . aspirin EC  81 mg Oral Daily  . atorvastatin  40 mg  Oral q1800  . budesonide (PULMICORT) nebulizer solution  0.5 mg Nebulization BID  . calcitRIOL  0.25 mcg Oral Daily  . epoetin (EPOGEN/PROCRIT) injection  8,000 Units Intravenous Q M,W,F-HD  . hydrALAZINE  50 mg Oral TID  . insulin aspart  0-15 Units Subcutaneous TID WC  . insulin aspart  0-5 Units Subcutaneous QHS  . loratadine  10 mg Oral Daily  . mouth rinse  15 mL Mouth Rinse BID  . metoprolol succinate  50 mg Oral Daily  . pantoprazole (PROTONIX) IV  40 mg Intravenous Q12H   Continuous Infusions: . sodium chloride    . sodium chloride    . sodium chloride    . sodium chloride    . sodium chloride       LOS: 14 days    Time spent in minutes: 25 minutes    Reyne Dumas, MD [facility] Pager: [phone] [email or web address] Password [facility] 01/20/2017, 10:52 AM

## 2017-01-20 NOTE — Progress Notes (Signed)
Subjective: Interval History: Patient says that she is feeling somewhat better.  Still she feels weak.  She did not have any nausea or vomiting.  Objective: Vital signs in last 24 hours: Temp:  [97.7 F (36.5 C)-98.8 F (37.1 C)] 98.7 F (37.1 C) (11/22 0442) Pulse Rate:  [66-106] 102 (11/22 0442) Resp:  [16-20] 20 (11/22 0442) BP: (84-168)/(41-72) 159/72 (11/22 0442) SpO2:  [96 %-100 %] 96 % (11/22 0753) Weight:  [84.7 kg (186 lb 11.2 oz)-85.1 kg (187 lb 9.8 oz)] 84.7 kg (186 lb 11.2 oz) (11/22 0442) Weight change: 0 kg (0 lb)  Intake/Output from previous day: 11/21 0701 - 11/22 0700 In: 1250 [P.O.:120; I.V.:500; Blood:630] Out: 1570 [Urine:200] Intake/Output this shift: No intake/output data recorded.  Patient on dialysis Chest is clear to auscultation Heart exam irevealed regular rate and rhythm no murmur Abdomen: Positive bowel sounds Extremities no edema   Lab Results: Recent Labs    01/19/17 0412 01/20/17 0457  WBC 11.6* 11.2*  HGB 6.1* 7.3*  HCT 20.2* 22.9*  PLT 212 195   BMET:  Recent Labs    01/18/17 0738 01/19/17 0412  NA 135 133*  K 3.8 4.3  CL 98* 97*  CO2 30 28  GLUCOSE 128* 148*  BUN 30* 70*  CREATININE 2.79* 3.44*  CALCIUM 6.8* 6.6*   No results for input(s): PTH in the last 72 hours. Iron Studies: No results for input(s): IRON, TIBC, TRANSFERRIN, FERRITIN in the last 72 hours.  Studies/Results: Ct Head Wo Contrast  Result Date: 01/19/2017 CLINICAL DATA:  Unexplained altered level of consciousness. EXAM: CT HEAD WITHOUT CONTRAST TECHNIQUE: Contiguous axial images were obtained from the base of the skull through the vertex without intravenous contrast. COMPARISON:  Brain MRI 09/17/2013 FINDINGS: Brain: Confluent low-density in the cerebral white matter consistent with chronic small vessel ischemia. Remote moderate to large right PCA distribution affecting the inferior temporal and occipital lobes. Small remote high right posterior frontal and  left parietal cortex infarcts. Patchy indistinct bilateral deep gray nuclei consistent with chronic small vessel ischemic injury. Remote right pontine infarct. Remote small vessel left cerebellar infarct. The left thalamus is asymmetrically low-density compared to the right common mildly expanded. This could reflect a recent infarct. No hemorrhage, hydrocephalus, or shift. Vascular: Atherosclerotic calcification. Small right ICA at the skullbase. There was right ICA occlusion on prior exam. No hyperdense vessel Skull: No acute or aggressive finding. Sinuses/Orbits: No acute finding IMPRESSION: 1. Compared to 2015 there is patchy low-density in the left thalamus with mild expansion. This may reflect a recent infarct if there is associated deficit. MRI could characterize if clinically appropriate. 2. Advanced chronic ischemic injury as noted above. 3. 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 Electronically Signed   By: Monte Fantasia M.D.   On: 01/19/2017 15:50   US Venous Img Lower Bilateral  Result Date: 01/19/2017 CLINICAL DATA:  Bilateral lower extremity swelling. EXAM: BILATERAL LOWER EXTREMITY VENOUS DOPPLER ULTRASOUND TECHNIQUE: Gray-scale sonography with graded compression, as well as color Doppler and duplex ultrasound were performed to evaluate the lower extremity deep venous systems from the level of the common femoral vein and including the common femoral, femoral, profunda femoral, popliteal and calf veins including the posterior tibial, peroneal and gastrocnemius veins when visible. The superficial great saphenous vein was also interrogated. Spectral Doppler was utilized to evaluate flow at rest and with distal augmentation maneuvers in the common femoral, femoral and popliteal veins. COMPARISON:  10/13/2016 FINDINGS: RIGHT LOWER  EXTREMITY Common Femoral Vein: No evidence of thrombus. Normal compressibility, respiratory phasicity and response to augmentation. Saphenofemoral  Junction: No evidence of thrombus. Normal compressibility and flow on color Doppler imaging. Profunda Femoral Vein: No evidence of thrombus. Normal compressibility and flow on color Doppler imaging. Femoral Vein: No evidence of thrombus. Normal compressibility, respiratory phasicity and response to augmentation. Popliteal Vein: No evidence of thrombus. Normal compressibility, respiratory phasicity and response to augmentation. Calf Veins: No evidence of thrombus. Normal compressibility and flow on color Doppler imaging. Superficial Great Saphenous Vein: No evidence of thrombus. Normal compressibility. Venous Reflux:  None. Other Findings:  None. LEFT LOWER EXTREMITY Common Femoral Vein: No evidence of thrombus. Normal compressibility, respiratory phasicity and response to augmentation. Saphenofemoral Junction: No evidence of thrombus. Normal compressibility and flow on color Doppler imaging. Profunda Femoral Vein: No evidence of thrombus. Normal compressibility and flow on color Doppler imaging. Femoral Vein: No evidence of thrombus. Normal compressibility, respiratory phasicity and response to augmentation. Popliteal Vein: No evidence of thrombus. Normal compressibility, respiratory phasicity and response to augmentation. Calf Veins: No evidence of thrombus. Normal compressibility and flow on color Doppler imaging. Superficial Great Saphenous Vein: No evidence of thrombus. Normal compressibility. Venous Reflux:  None. Other Findings:  None. IMPRESSION: No evidence of deep venous thrombosis. Electronically Signed   By: Rolm Baptise M.D.   On: 01/19/2017 15:35    I have reviewed the patient's current medications.  Assessment/Plan: Problem #1 renal failure: Patient presently on dialysis.  She was dialyzed yesterday.  She did not have any nausea or vomiting.  But her appetite is still poor. 2] hyperkalemia: her potassium is normal 3] history of CHF: No sign of fluid overload.  She denies any difficulty  breathing. 4] bone and mineral disorder: Her calcium is a range but phosphorus is low.  Presently she is on PhosLo 5] anemia:.  Her hemoglobin how ever remains below our target goal.  Presently she is on Epogen. 6] hypertension: Her blood pressure is reasonably controlled Plan:1] will make arrangement for patient to get dialysis tomorrow for 4 hours. 2] I will follow patient once an outpatient arrangement was [facility] is made. 3]CBC renal panel in am     LOS: 14 days   Ishani Goldwasser S 01/20/2017,8:36 AM

## 2017-01-21 ENCOUNTER — Inpatient Hospital Stay (HOSPITAL_COMMUNITY): Payer: 59

## 2017-01-21 LAB — CBC
HCT: 30 % — ABNORMAL LOW (ref 36.0–46.0)
HEMATOCRIT: 21 % — AB (ref 36.0–46.0)
HEMOGLOBIN: 6.6 g/dL — AB (ref 12.0–15.0)
HEMOGLOBIN: 9.8 g/dL — AB (ref 12.0–15.0)
MCH: 28.6 pg (ref 26.0–34.0)
MCH: 28.9 pg (ref 26.0–34.0)
MCHC: 31.4 g/dL (ref 30.0–36.0)
MCHC: 32.7 g/dL (ref 30.0–36.0)
MCV: 90.9 fL (ref 78.0–100.0)
MCV: 88.5 fL (ref 78.0–100.0)
PLATELETS: 168 10*3/uL (ref 150–400)
Platelets: 174 10*3/uL (ref 150–400)
RBC: 2.31 MIL/uL — ABNORMAL LOW (ref 3.87–5.11)
RBC: 3.39 MIL/uL — ABNORMAL LOW (ref 3.87–5.11)
RDW: 16.1 % — ABNORMAL HIGH (ref 11.5–15.5)
RDW: 15.5 % (ref 11.5–15.5)
WBC: 8 10*3/uL (ref 4.0–10.5)
WBC: 9.8 10*3/uL (ref 4.0–10.5)

## 2017-01-21 LAB — RENAL FUNCTION PANEL
ALBUMIN: 2.7 g/dL — AB (ref 3.5–5.0)
ANION GAP: 8 (ref 5–15)
BUN: 39 mg/dL — ABNORMAL HIGH (ref 6–20)
CHLORIDE: 97 mmol/L — AB (ref 101–111)
CO2: 28 mmol/L (ref 22–32)
Calcium: 6.8 mg/dL — ABNORMAL LOW (ref 8.9–10.3)
Creatinine, Ser: 3.45 mg/dL — ABNORMAL HIGH (ref 0.44–1.00)
GFR calc Af Amer: 15 mL/min — ABNORMAL LOW (ref >60)
GFR, EST NON AFRICAN AMERICAN: 13 mL/min — AB (ref >60)
Glucose, Bld: 122 mg/dL — ABNORMAL HIGH (ref 65–99)
PHOSPHORUS: 2.5 mg/dL (ref 2.5–4.6)
POTASSIUM: 3.8 mmol/L (ref 3.5–5.1)
Sodium: 133 mmol/L — ABNORMAL LOW (ref 135–145)

## 2017-01-21 LAB — GLUCOSE, CAPILLARY
GLUCOSE-CAPILLARY: 135 mg/dL — AB (ref 65–99)
GLUCOSE-CAPILLARY: 109 mg/dL — AB (ref 65–99)
GLUCOSE-CAPILLARY: 167 mg/dL — AB (ref 65–99)
GLUCOSE-CAPILLARY: 123 mg/dL — AB (ref 65–99)

## 2017-01-21 LAB — PREPARE RBC (CROSSMATCH)

## 2017-01-21 LAB — HEMOGLOBIN AND HEMATOCRIT, BLOOD
HEMATOCRIT: 28.4 % — AB (ref 36.0–46.0)
HEMOGLOBIN: 9.3 g/dL — AB (ref 12.0–15.0)

## 2017-01-21 MED ORDER — PANTOPRAZOLE SODIUM 40 MG PO TBEC
40.0000 mg | DELAYED_RELEASE_TABLET | Freq: Two times a day (BID) | ORAL | Status: DC
  Administered 2017-01-21 – 2017-01-27 (×11): 40 mg via ORAL
  Filled 2017-01-21 (×11): qty 1

## 2017-01-21 MED ORDER — SODIUM CHLORIDE 0.9 % IV SOLN: 100.0000 mL | INTRAVENOUS | Status: DC | PRN

## 2017-01-21 MED ORDER — SODIUM CHLORIDE 0.9 % IV SOLN
Freq: Once | INTRAVENOUS | Status: AC
  Administered 2017-01-21: 09:00:00 via INTRAVENOUS

## 2017-01-21 MED ORDER — POLYETHYLENE GLYCOL 3350 17 G PO PACK
17.0000 g | PACK | ORAL | Status: DC
Start: 2017-01-21 — End: 2017-01-21

## 2017-01-21 MED ORDER — BISACODYL 5 MG PO TBEC
10.0000 mg | DELAYED_RELEASE_TABLET | Freq: Four times a day (QID) | ORAL | Status: AC
  Administered 2017-01-21 (×2): 10 mg via ORAL
  Filled 2017-01-21 (×2): qty 2

## 2017-01-21 MED ORDER — POLYETHYLENE GLYCOL 3350 17 G PO PACK
17.0000 g | PACK | ORAL | Status: AC
  Administered 2017-01-21 (×6): 17 g via ORAL
  Filled 2017-01-21 (×4): qty 1

## 2017-01-21 MED ORDER — BOOST / RESOURCE BREEZE PO LIQD
1.0000 | Freq: Three times a day (TID) | ORAL | Status: DC
  Administered 2017-01-21 – 2017-01-26 (×9): 1 via ORAL

## 2017-01-21 NOTE — H&P (View-Only) (Signed)
Patient ID: Kelli [name], female   DOB: [birth year], 66 y.o.   MRN: [id number]   Assessment/Plan: ADMITTED WITH A TRANSFUSION DEPENDENT ANEMIA. NO BRBPR OR MELENA. HB DOWN TO 6.6  PLAN: 1. MLX Q1Hx 6  DOSES. ADD DULCOLAX 10 MG PO x 2. TAP WATER ENEMAS x2 ON NOV 24. 2. TCS/EGD NOV 24 @0800 . WILL SEDATE WITH FENTANYL/VERSED. 3. NPO AFTER MN EXPECT MEDS 4. COMPLETE 2u p RBCs TODAY. 5. NO HEPARIN WITH DIALYSIS.    Subjective: Since I last evaluated the patient PT TRIED TO TAKE MIRALAX PREP. WANTS TO EAT. NO BRBPR OR MELENA.   Objective: Vital signs in last 24 hours: Vitals:   01/21/17 0930 01/21/17 0945  BP: (!) 130/57 (!) 124/49  Pulse: 90 89  Resp: 18 18  Temp: 98.4 F (36.9 C) 98.9 F (37.2 C)  SpO2: 100% 100%   General appearance: alert, cooperative and no distress Resp: COARSE BREATH SOUNDS BILATERALLY, FAIR AIR MOVEMENT Cardio: regular rate and rhythm GI: soft, non-tender; bowel sounds normal;  Lab Results:  K 3.8 Cr 3.4  Hb 6.6(6.1-7.3 SINCE ADMISSION)    Studies/Results: Mr Brain Wo Contrast  Result Date: 01/21/2017 CLINICAL DATA:  66 year old female with altered mental status for 2 days. Abnormal density in the left thalamus new compared to 2015 on recent head CT. EXAM: MRI HEAD WITHOUT CONTRAST TECHNIQUE: Multiplanar, multiecho pulse sequences of the brain and surrounding structures were obtained without intravenous contrast. COMPARISON:  Head CT without contrast 01/19/2017. Brain MRI 09/17/2013. FINDINGS: Brain: The only possible focus of acutely restricted diffusion is a small area in the right caudate nucleus on series 3, image 87. Mild T2 and FLAIR hyperintensity with no hemorrhage or mass effect. Severe chronic ischemic disease. Cortical encephalomalacia in the right greater than left MCA and right PCA territories with advanced Wallerian degeneration in the right brainstem. Progressed since 2015 and confluent abnormal signal in the left deep gray matter nuclei,  but mostly facilitated on diffusion indicating subacute to chronic time course. Progressed chronic small vessel ischemia in the left cerebellum since 2015. Progressed patchy and confluent left MCA territory white matter T2 and FLAIR hyperintensity. Scattered post ischemic hemosiderin and occasional other chronic micro hemorrhages. No midline shift, mass effect, evidence of mass lesion, ventriculomegaly, extra-axial collection or acute intracranial hemorrhage. Cervicomedullary junction and pituitary are within normal limits. 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 Other Major intracranial vascular flow voids are stable. The distal right vertebral artery is dominant. Skull and upper cervical spine: Negative. Visualized bone marrow signal is within normal limits. Sinuses/Orbits: Stable orbits soft tissues. Mild paranasal sinus mucosal thickening has not significantly changed. Other: Mild left mastoid effusion is new but significance is doubtful. Small volume retained secretions in the left nasopharynx. Right mastoids are clear. Scalp and face soft tissues appear negative. IMPRESSION: 1. Possible small acute lacunar infarct in the right caudate nucleus. No associated hemorrhage or mass effect. 2. No other acute ischemia identified, but very severe chronic ischemic disease. Progression in the left deep gray matter nuclei and the left cerebellum since 2015. Right greater than left chronic MCA and right PCA territory encephalomalacia with advanced Wallerian degeneration in the right brainstem. 3. Chronic right ICA occlusion. Electronically Signed   By: Genevie Ann M.D.   On: 01/21/2017 07:56    Medications: I have reviewed the patient's current medications.   LOS: 5 days   [name] 08/09/2013, 2:23 PM

## 2017-01-21 NOTE — Progress Notes (Signed)
Subjective: Interval History: Patient complains of feeling week and tired. Wants to eat. Dennies any difficulty in breathing  Objective: Vital signs in last 24 hours: Temp:  [98.2 F (36.8 C)-98.6 F (37 C)] 98.4 F (36.9 C) (11/23 0455) Pulse Rate:  [80-88] 80 (11/23 0455) Resp:  [16-20] 18 (11/23 0455) BP: (118-123)/(41-61) 118/61 (11/23 0455) SpO2:  [97 %-99 %] 97 % (11/23 0455) Weight:  [83.7 kg (184 lb 9.6 oz)] 83.7 kg (184 lb 9.6 oz) (11/23 0455) Weight change: -1.366 kg (-0.2 oz)  Intake/Output from previous day: 11/22 0701 - 11/23 0700 In: 1200 [P.O.:1200] Out: 225 [Urine:225] Intake/Output this shift: No intake/output data recorded.  Patient on dialysis Chest is clear to auscultation Heart exam irevealed regular rate and rhythm no murmur Abdomen: Positive bowel sounds Extremities no edema   Lab Results: Recent Labs    01/20/17 0457 01/21/17 0451  WBC 11.2* 8.0  HGB 7.3* 6.6*  HCT 22.9* 21.0*  PLT 195 174   BMET:  Recent Labs    01/19/17 0412 01/21/17 0451  NA 133* 133*  K 4.3 3.8  CL 97* 97*  CO2 28 28  GLUCOSE 148* 122*  BUN 70* 39*  CREATININE 3.44* 3.45*  CALCIUM 6.6* 6.8*   No results for input(s): PTH in the last 72 hours. Iron Studies: No results for input(s): IRON, TIBC, TRANSFERRIN, FERRITIN in the last 72 hours.  Studies/Results: Ct Head Wo Contrast  Result Date: 01/19/2017 CLINICAL DATA:  Unexplained altered level of consciousness. EXAM: CT HEAD WITHOUT CONTRAST TECHNIQUE: Contiguous axial images were obtained from the base of the skull through the vertex without intravenous contrast. COMPARISON:  Brain MRI 09/17/2013 FINDINGS: Brain: Confluent low-density in the cerebral white matter consistent with chronic small vessel ischemia. Remote moderate to large right PCA distribution affecting the inferior temporal and occipital lobes. Small remote high right posterior frontal and left parietal cortex infarcts. Patchy indistinct bilateral deep  gray nuclei consistent with chronic small vessel ischemic injury. Remote right pontine infarct. Remote small vessel left cerebellar infarct. The left thalamus is asymmetrically low-density compared to the right common mildly expanded. This could reflect a recent infarct. No hemorrhage, hydrocephalus, or shift. Vascular: Atherosclerotic calcification. Small right ICA at the skullbase. There was right ICA occlusion on prior exam. No hyperdense vessel Skull: No acute or aggressive finding. Sinuses/Orbits: No acute finding IMPRESSION: 1. Compared to 2015 there is patchy low-density in the left thalamus with mild expansion. This may reflect a recent infarct if there is associated deficit. MRI could characterize if clinically appropriate. 2. Advanced chronic ischemic injury as noted above. 3. 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 Electronically Signed   By: Monte Fantasia M.D.   On: 01/19/2017 15:50   Mr Brain Wo Contrast  Result Date: 01/21/2017 CLINICAL DATA:  66 year old female with altered mental status for 2 days. Abnormal density in the left thalamus new compared to 2015 on recent head CT. EXAM: MRI HEAD WITHOUT CONTRAST TECHNIQUE: Multiplanar, multiecho pulse sequences of the brain and surrounding structures were obtained without intravenous contrast. COMPARISON:  Head CT without contrast 01/19/2017. Brain MRI 09/17/2013. FINDINGS: Brain: The only possible focus of acutely restricted diffusion is a small area in the right caudate nucleus on series 3, image 87. Mild T2 and FLAIR hyperintensity with no hemorrhage or mass effect. Severe chronic ischemic disease. Cortical encephalomalacia in the right greater than left MCA and right PCA territories with advanced Wallerian degeneration in the right brainstem. Progressed since  2015 and confluent abnormal signal in the left deep gray matter nuclei, but mostly facilitated on diffusion indicating subacute to chronic time course. Progressed  chronic small vessel ischemia in the left cerebellum since 2015. Progressed patchy and confluent left MCA territory white matter T2 and FLAIR hyperintensity. Scattered post ischemic hemosiderin and occasional other chronic micro hemorrhages. No midline shift, mass effect, evidence of mass lesion, ventriculomegaly, extra-axial collection or acute intracranial hemorrhage. Cervicomedullary junction and pituitary are within normal limits. 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 Other Major intracranial vascular flow voids are stable. The distal right vertebral artery is dominant. Skull and upper cervical spine: Negative. Visualized bone marrow signal is within normal limits. Sinuses/Orbits: Stable orbits soft tissues. Mild paranasal sinus mucosal thickening has not significantly changed. Other: Mild left mastoid effusion is new but significance is doubtful. Small volume retained secretions in the left nasopharynx. Right mastoids are clear. Scalp and face soft tissues appear negative. IMPRESSION: 1. Possible small acute lacunar infarct in the right caudate nucleus. No associated hemorrhage or mass effect. 2. No other acute ischemia identified, but very severe chronic ischemic disease. Progression in the left deep gray matter nuclei and the left cerebellum since 2015. Right greater than left chronic MCA and right PCA territory encephalomalacia with advanced Wallerian degeneration in the right brainstem. 3. Chronic right ICA occlusion. Electronically Signed   By: Genevie Ann M.D.   On: 01/21/2017 07:56   US Venous Img Lower Bilateral  Result Date: 01/19/2017 CLINICAL DATA:  Bilateral lower extremity swelling. EXAM: BILATERAL LOWER EXTREMITY VENOUS DOPPLER ULTRASOUND TECHNIQUE: Gray-scale sonography with graded compression, as well as color Doppler and duplex ultrasound were performed to evaluate the lower extremity deep venous systems from the level of the common femoral vein and including  the common femoral, femoral, profunda femoral, popliteal and calf veins including the posterior tibial, peroneal and gastrocnemius veins when visible. The superficial great saphenous vein was also interrogated. Spectral Doppler was utilized to evaluate flow at rest and with distal augmentation maneuvers in the common femoral, femoral and popliteal veins. COMPARISON:  10/13/2016 FINDINGS: RIGHT LOWER EXTREMITY Common Femoral Vein: No evidence of thrombus. Normal compressibility, respiratory phasicity and response to augmentation. Saphenofemoral Junction: No evidence of thrombus. Normal compressibility and flow on color Doppler imaging. Profunda Femoral Vein: No evidence of thrombus. Normal compressibility and flow on color Doppler imaging. Femoral Vein: No evidence of thrombus. Normal compressibility, respiratory phasicity and response to augmentation. Popliteal Vein: No evidence of thrombus. Normal compressibility, respiratory phasicity and response to augmentation. Calf Veins: No evidence of thrombus. Normal compressibility and flow on color Doppler imaging. Superficial Great Saphenous Vein: No evidence of thrombus. Normal compressibility. Venous Reflux:  None. Other Findings:  None. LEFT LOWER EXTREMITY Common Femoral Vein: No evidence of thrombus. Normal compressibility, respiratory phasicity and response to augmentation. Saphenofemoral Junction: No evidence of thrombus. Normal compressibility and flow on color Doppler imaging. Profunda Femoral Vein: No evidence of thrombus. Normal compressibility and flow on color Doppler imaging. Femoral Vein: No evidence of thrombus. Normal compressibility, respiratory phasicity and response to augmentation. Popliteal Vein: No evidence of thrombus. Normal compressibility, respiratory phasicity and response to augmentation. Calf Veins: No evidence of thrombus. Normal compressibility and flow on color Doppler imaging. Superficial Great Saphenous Vein: No evidence of thrombus.  Normal compressibility. Venous Reflux:  None. Other Findings:  None. IMPRESSION: No evidence of deep venous thrombosis. Electronically Signed   By: Rolm Baptise M.D.   On: 01/19/2017 15:35  Dg Chest Port 1 View  Result Date: 01/20/2017 CLINICAL DATA:  Altered mental status short of breath EXAM: PORTABLE CHEST 1 VIEW COMPARISON:  01/09/2017 FINDINGS: Interval placement of left jugular dual-lumen catheter with tip in the SVC. Right jugular catheter tip in the SVC unchanged. No pneumothorax Interval improvement in diffuse bilateral airspace disease. Mild vascular congestion remains with left lower lobe atelectasis and small left effusion IMPRESSION: Left jugular dual-lumen catheter in good position. Improvement in bilateral edema. Electronically Signed   By: Franchot Gallo M.D.   On: 01/20/2017 08:41    I have reviewed the patient's current medications.  Assessment/Plan: Problem #1 renal failure: She is status post hemodialysis on Wednesday.  Presently she is asymptomatic.  She is due for dialysis today 2] hyperkalemia: her potassium is normal 3] history of CHF: No sign of fluid overload.  She denies any difficulty breathing. 4] bone and mineral disorder: Her calcium and phosphorus is in the range.  Patient is not on a binder. 5] anemia:.  Her hemoglobin has declined.  Presently she is being evaluated by GI for possible GI bleeding. 6] hypertension: Her blood pressure is reasonably controlled 7] CVA: Patient came from MRI.  Results pending Plan:1] will make arrangement for patient to get dialysis  for 4 hours. 2] will transfuse patient if blood is ready during dialysis. 3]CBC renal panel in am     LOS: 15 days   Taiyana Kissler S 01/21/2017,8:11 AM

## 2017-01-21 NOTE — Progress Notes (Signed)
CRITICAL VALUE ALERT  Critical Value:  HGB 6.6  Date & Time Notied:  01/21/17 0640  Provider Notified: Olevia Bowens  Orders Received/Actions taken: orders recieved

## 2017-01-21 NOTE — Progress Notes (Signed)
PT Cancellation Note  Patient Details Name: Kelli [name] MRN: [id number] DOB: 06/07/1950   Cancelled Treatment:    Reason Eval/Treat Not Completed: Patient not medically ready.  HGB low and patient receiving dialysis.   3:26 PM, 01/21/17 Lonell Grandchild, MPT Physical Therapist with [facility] [phone] office [phone] mobile phone

## 2017-01-21 NOTE — Clinical Social Work Placement (Signed)
   CLINICAL SOCIAL WORK PLACEMENT  NOTE  Date:  01/21/2017  Patient Details  Name: Kelli [name] MRN: [id number] Date of Birth: Jun 02, 1950  Clinical Social Work is seeking post-discharge placement for this patient at the [place] level of care (*CSW will initial, date and re-position this form in  chart as items are completed):  Yes   Patient/family provided with [place] Work Department's list of facilities offering this level of care within the geographic area requested by the patient (or if unable, by the patient's family).  Yes   Patient/family informed of their freedom to choose among providers that offer the needed level of care, that participate in Medicare, Medicaid or managed care program needed by the patient, have an available bed and are willing to accept the patient.  Yes   Patient/family informed of [facility]'s ownership interest in [facility] and [facility], as well as of the fact that they are under no obligation to receive care at these facilities.  PASRR submitted to EDS on       PASRR number received on       Existing PASRR number confirmed on 01/21/17     FL2 transmitted to all facilities in geographic area requested by pt/family on 01/21/17     FL2 transmitted to all facilities within larger geographic area on       Patient informed that his/her managed care company has contracts with or will negotiate with certain facilities, including the following:        Yes   Patient/family informed of bed offers received.  Patient chooses bed at [facility]     Physician recommends and patient chooses bed at      Patient to be transferred to [facility] [place] on  .  Patient to be transferred to facility by       Patient family notified on   of transfer.  Name of family member notified:        PHYSICIAN       Additional Comment:    _______________________________________________ Ihor Gully,  LCSW 01/21/2017, 4:55 PM

## 2017-01-21 NOTE — Care Management Important Message (Signed)
Important Message  Patient Details  Name: Kelli [name] MRN: [id number] Date of Birth: 06/29/1950   Medicare Important Message Given:  Yes    Sherald Barge, RN 01/21/2017, 1:15 PM

## 2017-01-21 NOTE — Progress Notes (Signed)
Patient ID: Kelli [name], female   DOB: 1950-06-02, 66 y.o.   MRN: [id number]   Assessment/Plan: ADMITTED WITH A TRANSFUSION DEPENDENT ANEMIA. NO BRBPR OR MELENA. HB DOWN TO 6.6  PLAN: 1. MLX Q1Hx 6  DOSES. ADD DULCOLAX 10 MG PO x 2. TAP WATER ENEMAS x2 ON NOV 24. 2. TCS/EGD NOV 24 @0800 . WILL SEDATE WITH FENTANYL/VERSED. 3. NPO AFTER MN EXPECT MEDS 4. COMPLETE 2u p RBCs TODAY. 5. NO HEPARIN WITH DIALYSIS.    Subjective: Since I last evaluated the patient PT TRIED TO TAKE MIRALAX PREP. WANTS TO EAT. NO BRBPR OR MELENA.   Objective: Vital signs in last 24 hours: Vitals:   01/21/17 0930 01/21/17 0945  BP: (!) 130/57 (!) 124/49  Pulse: 90 89  Resp: 18 18  Temp: 98.4 F (36.9 C) 98.9 F (37.2 C)  SpO2: 100% 100%   General appearance: alert, cooperative and no distress Resp: COARSE BREATH SOUNDS BILATERALLY, FAIR AIR MOVEMENT Cardio: regular rate and rhythm GI: soft, non-tender; bowel sounds normal;  Lab Results:  K 3.8 Cr 3.4  Hb 6.6(6.1-7.3 SINCE ADMISSION)    Studies/Results: Mr Brain Wo Contrast  Result Date: 01/21/2017 CLINICAL DATA:  66 year old female with altered mental status for 2 days. Abnormal density in the left thalamus new compared to 2015 on recent head CT. EXAM: MRI HEAD WITHOUT CONTRAST TECHNIQUE: Multiplanar, multiecho pulse sequences of the brain and surrounding structures were obtained without intravenous contrast. COMPARISON:  Head CT without contrast 01/19/2017. Brain MRI 09/17/2013. FINDINGS: Brain: The only possible focus of acutely restricted diffusion is a small area in the right caudate nucleus on series 3, image 87. Mild T2 and FLAIR hyperintensity with no hemorrhage or mass effect. Severe chronic ischemic disease. Cortical encephalomalacia in the right greater than left MCA and right PCA territories with advanced Wallerian degeneration in the right brainstem. Progressed since 2015 and confluent abnormal signal in the left deep gray matter nuclei,  but mostly facilitated on diffusion indicating subacute to chronic time course. Progressed chronic small vessel ischemia in the left cerebellum since 2015. Progressed patchy and confluent left MCA territory white matter T2 and FLAIR hyperintensity. Scattered post ischemic hemosiderin and occasional other chronic micro hemorrhages. No midline shift, mass effect, evidence of mass lesion, ventriculomegaly, extra-axial collection or acute intracranial hemorrhage. Cervicomedullary junction and pituitary are within normal limits. 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 Other Major intracranial vascular flow voids are stable. The distal right vertebral artery is dominant. Skull and upper cervical spine: Negative. Visualized bone marrow signal is within normal limits. Sinuses/Orbits: Stable orbits soft tissues. Mild paranasal sinus mucosal thickening has not significantly changed. Other: Mild left mastoid effusion is new but significance is doubtful. Small volume retained secretions in the left nasopharynx. Right mastoids are clear. Scalp and face soft tissues appear negative. IMPRESSION: 1. Possible small acute lacunar infarct in the right caudate nucleus. No associated hemorrhage or mass effect. 2. No other acute ischemia identified, but very severe chronic ischemic disease. Progression in the left deep gray matter nuclei and the left cerebellum since 2015. Right greater than left chronic MCA and right PCA territory encephalomalacia with advanced Wallerian degeneration in the right brainstem. 3. Chronic right ICA occlusion. Electronically Signed   By: Genevie Ann M.D.   On: 01/21/2017 07:56    Medications: I have reviewed the patient's current medications.   LOS: 5 days   [name] 08/09/2013, 2:23 PM

## 2017-01-21 NOTE — Clinical Social Work Note (Signed)
[facility] advised that patient has a confirmed chair for the MWF 11:30 chair time.  Her start date is Monday 01/24/17 at [facility] in [place].     Octavis Sheeler, Clydene Pugh, LCSW

## 2017-01-21 NOTE — Progress Notes (Signed)
[facility] PROGRESS NOTE  KITTI MCCLISH XTK:[id number] DOB: [birth year] DOA: 01/06/2017 PCP: Manon Hilding, MD  Brief summary   66 year old female with a history of stroke with residual left hemiparesis, CKD stage III, essential hypertension, diabetes mellitus, peripheral vascular disease presentedwith 1 day history of shortness of breath, worsening leg edema, and chest discomfort. The patient also complains of a cough with clear sputum. She had subjective fevers and chills, but denied any headache, neck pain.T  -In ER: Hb 7.6. Chest x-ray showed pulmonary edema with BNP 966. The patientwas started on IV Lasix but remained fluid overloaded and her renal function continued to worsen. Repeat chest x-ray revealed pulmonary edema. Nephrology was consulted to assist with management and possible initiation of dialysis. Dr. Constance Haw placed VasCath on 01/11/17.    Assessment/Plan:  Acute on chronic renal failure/acute on chronic diastolic CHF. now ESRD dependent,ESRD initiated 01/11/17,Tunnelled cath placed 01/14/17. appreciate management per nephrology service. Continue hemodialysis for volume control. Patient is anuric at this moment. -SNF for rehabilitation;once outpatient hemodialysis service has been secure.  Episode of Melena. Acute blood loss anemia. Hx of duodenal ulcers on EGD in 2016. She was taken off asa and plavix at some point after that due to GI bleeding .  -Received 2 u PRBC on 11/8, received another 2 units with HD 11/21. Repeated blood transfusion today. on iv PPI. Planned for colonoscopy AM  Hx of Stroke (cryptogenic)with left hemiparesis. has a loop recorder in place. did not take asa at home per husband due to GI BLEED hx. MRI showed Possible small acute lacunar infarct in the right caudate nucleus. No associated hemorrhage or mass effect. Echo: LVEF-55%. No defect or patent foramen ovale was identified.  -patient denies acute or new focal neuro symptoms to  me. She is not a good candidate for dual antiplatelets or anticoagulation therapy at this time due to active melena. continue statins will resume antiplatelet regimen  post colonoscopy if no risk for bleeding. Check carotid ultrasound, lipid profile. ha1c  Acute hypoxic resp failure. Secondary tofluid overload and COPD.  -improved. continue HD now for volume management.  completed steroids tapering.continue PRN nebulizer.  Leukocytosis. Likely due to steroids. no signs of infection  Metabolic acidosis due to renal failure. Cont HD  HTN (hypertension). will continuehydralazine and toprol. HD will also help controlling BP and volume.  Elevated troponin. most likely associated with CKD. patient denying CP and SOB  Sinus arrhythmia with occasional short runs of SVT. she has a loop recorded which was placed after a CVA in 2/15. d/c TID nebulizer and minimize use of albuterol. Dr Domenic Polite aware of the first episode - no further rec's currently. folllow electrolytes closely and continue b-blocker.  Diabetes mellitus type 2 in obese. continue SSI   Mixed hyperlipidemia. continue statin   Severe fatigue/ depression with flat affect. patient agrees to discussed with her husband and caregiver for final decision. understand short term rehabilitation at SNF is the best and safest discharge plans.    Code Status: full Family Communication: d/w patient, RN. GI. No family at the bedside (indicate person spoken with, relationship, and if by phone, the number) Disposition Plan: SNF likely next week. Monday if stable    Consultants:  nephro  Palliative care  IR    Procedures:  HD cath by surgery  Tunneled dialysis cath per IR  2 D ECHO Left ventricle: The cavity size was normal. Systolic function was normal. The estimated ejection fraction was in the range of 55% to  60%. Wall motion was normal; there were no regional wall motion abnormalities. The study is not  technically sufficient to allow evaluation of LV diastolic function due to tachycardia and E/a fusion. Moderate to severe left ventricular hypertrophy. - Aortic valve: Trileaflet; mildly thickened, mildly calcified leaflets. Sclerosis without stenosis. There was no stenosis. - Mitral valve: Calcified annulus. - Left atrium: The atrium was mildly dilated. - Atrial septum: No defect or patent foramen ovale was identified.     Antibiotics: Anti-infectives (From admission, onward)   Start     Dose/Rate Route Frequency Ordered Stop   01/14/17 1415  ceFAZolin (ANCEF) IVPB 2g/100 mL premix     2 g 200 mL/hr over 30 Minutes Intravenous To Radiology 01/14/17 1413 01/14/17 1455        (indicate start date, and stop date if known)  HPI/Subjective: Alert. Reports chronic left hemiplegia. No new symptoms. Receiving blood TF. Planned for HD today. And colonoscopy AM  Objective: Vitals:   01/21/17 0930 01/21/17 0945  BP: (!) 130/57 (!) 124/49  Pulse: 90 89  Resp: 18 18  Temp: 98.4 F (36.9 C) 98.9 F (37.2 C)  SpO2: 100% 100%    Intake/Output Summary (Last 24 hours) at 01/21/2017 1104 Last data filed at 01/21/2017 0930 Gross per 24 hour  Intake 1038 ml  Output 225 ml  Net 813 ml   Filed Weights   01/19/17 0845 01/20/17 0442 01/21/17 0455  Weight: 85.1 kg (187 lb 9.8 oz) 84.7 kg (186 lb 11.2 oz) 83.7 kg (184 lb 9.6 oz)    Exam:   General:  No distress   Cardiovascular: s1,s2 rrr  Respiratory: few crackles   Abdomen: soft, obese. nt  Musculoskeletal: mild pedal edema    Data Reviewed: Basic Metabolic Panel: Recent Labs  Lab 01/15/17 0657 01/16/17 0644 01/18/17 0738 01/19/17 0412 01/21/17 0451  NA 136 136 135 133* 133*  K 3.7 3.6 3.8 4.3 3.8  CL 98* 99* 98* 97* 97*  CO2 26 27 30 28 28   GLUCOSE 127* 127* 128* 148* 122*  BUN 54* 63* 30* 70* 39*  CREATININE 3.54* 4.02* 2.79* 3.44* 3.45*  CALCIUM 6.9* 6.7* 6.8* 6.6* 6.8*  PHOS 3.8 4.0 2.2* 1.8* 2.5    Liver Function Tests: Recent Labs  Lab 01/15/17 0657 01/16/17 0644 01/18/17 0738 01/19/17 0412 01/21/17 0451  ALBUMIN 2.6* 2.5* 2.4* 2.3* 2.7*   No results for input(s): LIPASE, AMYLASE in the last 168 hours. Recent Labs  Lab 01/19/17 1339  AMMONIA 14   CBC: Recent Labs  Lab 01/17/17 0900 01/18/17 0738 01/19/17 0412 01/20/17 0457 01/21/17 0451  WBC 12.6* 9.7 11.6* 11.2* 8.0  HGB 7.4* 7.1* 6.1* 7.3* 6.6*  HCT 23.5* 23.1* 20.2* 22.9* 21.0*  MCV 90.0 90.9 92.2 88.4 90.9  PLT 205 205 212 195 174   Cardiac Enzymes: No results for input(s): CKTOTAL, CKMB, CKMBINDEX, TROPONINI in the last 168 hours. BNP (last 3 results) Recent Labs    10/13/16 0352 01/06/17 1655  BNP 383.0* 966.0*    ProBNP (last 3 results) No results for input(s): PROBNP in the last 8760 hours.  CBG: Recent Labs  Lab 01/20/17 0733 01/20/17 1137 01/20/17 1643 01/20/17 2113 01/21/17 0757  GLUCAP 126* 189* 96 111* 135*    No results found for this or any previous visit (from the past 240 hour(s)).   Studies: Ct Head Wo Contrast  Result Date: 01/19/2017 CLINICAL DATA:  Unexplained altered level of consciousness. EXAM: CT HEAD WITHOUT CONTRAST TECHNIQUE: Contiguous axial images  were obtained from the base of the skull through the vertex without intravenous contrast. COMPARISON:  Brain MRI 09/17/2013 FINDINGS: Brain: Confluent low-density in the cerebral white matter consistent with chronic small vessel ischemia. Remote moderate to large right PCA distribution affecting the inferior temporal and occipital lobes. Small remote high right posterior frontal and left parietal cortex infarcts. Patchy indistinct bilateral deep gray nuclei consistent with chronic small vessel ischemic injury. Remote right pontine infarct. Remote small vessel left cerebellar infarct. The left thalamus is asymmetrically low-density compared to the right common mildly expanded. This could reflect a recent infarct. No  hemorrhage, hydrocephalus, or shift. Vascular: Atherosclerotic calcification. Small right ICA at the skullbase. There was right ICA occlusion on prior exam. No hyperdense vessel Skull: No acute or aggressive finding. Sinuses/Orbits: No acute finding IMPRESSION: 1. Compared to 2015 there is patchy low-density in the left thalamus with mild expansion. This may reflect a recent infarct if there is associated deficit. MRI could characterize if clinically appropriate. 2. Advanced chronic ischemic injury as noted above. 3. 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 Electronically Signed   By: Monte Fantasia M.D.   On: 01/19/2017 15:50   Mr Brain Wo Contrast  Result Date: 01/21/2017 CLINICAL DATA:  66 year old female with altered mental status for 2 days. Abnormal density in the left thalamus new compared to 2015 on recent head CT. EXAM: MRI HEAD WITHOUT CONTRAST TECHNIQUE: Multiplanar, multiecho pulse sequences of the brain and surrounding structures were obtained without intravenous contrast. COMPARISON:  Head CT without contrast 01/19/2017. Brain MRI 09/17/2013. FINDINGS: Brain: The only possible focus of acutely restricted diffusion is a small area in the right caudate nucleus on series 3, image 87. Mild T2 and FLAIR hyperintensity with no hemorrhage or mass effect. Severe chronic ischemic disease. Cortical encephalomalacia in the right greater than left MCA and right PCA territories with advanced Wallerian degeneration in the right brainstem. Progressed since 2015 and confluent abnormal signal in the left deep gray matter nuclei, but mostly facilitated on diffusion indicating subacute to chronic time course. Progressed chronic small vessel ischemia in the left cerebellum since 2015. Progressed patchy and confluent left MCA territory white matter T2 and FLAIR hyperintensity. Scattered post ischemic hemosiderin and occasional other chronic micro hemorrhages. No midline shift, mass effect,  evidence of mass lesion, ventriculomegaly, extra-axial collection or acute intracranial hemorrhage. Cervicomedullary junction and pituitary are within normal limits. 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 Other Major intracranial vascular flow voids are stable. The distal right vertebral artery is dominant. Skull and upper cervical spine: Negative. Visualized bone marrow signal is within normal limits. Sinuses/Orbits: Stable orbits soft tissues. Mild paranasal sinus mucosal thickening has not significantly changed. Other: Mild left mastoid effusion is new but significance is doubtful. Small volume retained secretions in the left nasopharynx. Right mastoids are clear. Scalp and face soft tissues appear negative. IMPRESSION: 1. Possible small acute lacunar infarct in the right caudate nucleus. No associated hemorrhage or mass effect. 2. No other acute ischemia identified, but very severe chronic ischemic disease. Progression in the left deep gray matter nuclei and the left cerebellum since 2015. Right greater than left chronic MCA and right PCA territory encephalomalacia with advanced Wallerian degeneration in the right brainstem. 3. Chronic right ICA occlusion. Electronically Signed   By: Genevie Ann M.D.   On: 01/21/2017 07:56   US Venous Img Lower Bilateral  Result Date: 01/19/2017 CLINICAL DATA:  Bilateral lower extremity swelling.  EXAM: BILATERAL LOWER EXTREMITY VENOUS DOPPLER ULTRASOUND TECHNIQUE: Gray-scale sonography with graded compression, as well as color Doppler and duplex ultrasound were performed to evaluate the lower extremity deep venous systems from the level of the common femoral vein and including the common femoral, femoral, profunda femoral, popliteal and calf veins including the posterior tibial, peroneal and gastrocnemius veins when visible. The superficial great saphenous vein was also interrogated. Spectral Doppler was utilized to evaluate flow at rest and  with distal augmentation maneuvers in the common femoral, femoral and popliteal veins. COMPARISON:  10/13/2016 FINDINGS: RIGHT LOWER EXTREMITY Common Femoral Vein: No evidence of thrombus. Normal compressibility, respiratory phasicity and response to augmentation. Saphenofemoral Junction: No evidence of thrombus. Normal compressibility and flow on color Doppler imaging. Profunda Femoral Vein: No evidence of thrombus. Normal compressibility and flow on color Doppler imaging. Femoral Vein: No evidence of thrombus. Normal compressibility, respiratory phasicity and response to augmentation. Popliteal Vein: No evidence of thrombus. Normal compressibility, respiratory phasicity and response to augmentation. Calf Veins: No evidence of thrombus. Normal compressibility and flow on color Doppler imaging. Superficial Great Saphenous Vein: No evidence of thrombus. Normal compressibility. Venous Reflux:  None. Other Findings:  None. LEFT LOWER EXTREMITY Common Femoral Vein: No evidence of thrombus. Normal compressibility, respiratory phasicity and response to augmentation. Saphenofemoral Junction: No evidence of thrombus. Normal compressibility and flow on color Doppler imaging. Profunda Femoral Vein: No evidence of thrombus. Normal compressibility and flow on color Doppler imaging. Femoral Vein: No evidence of thrombus. Normal compressibility, respiratory phasicity and response to augmentation. Popliteal Vein: No evidence of thrombus. Normal compressibility, respiratory phasicity and response to augmentation. Calf Veins: No evidence of thrombus. Normal compressibility and flow on color Doppler imaging. Superficial Great Saphenous Vein: No evidence of thrombus. Normal compressibility. Venous Reflux:  None. Other Findings:  None. IMPRESSION: No evidence of deep venous thrombosis. Electronically Signed   By: Rolm Baptise M.D.   On: 01/19/2017 15:35   Dg Chest Port 1 View  Result Date: 01/20/2017 CLINICAL DATA:  Altered mental  status short of breath EXAM: PORTABLE CHEST 1 VIEW COMPARISON:  01/09/2017 FINDINGS: Interval placement of left jugular dual-lumen catheter with tip in the SVC. Right jugular catheter tip in the SVC unchanged. No pneumothorax Interval improvement in diffuse bilateral airspace disease. Mild vascular congestion remains with left lower lobe atelectasis and small left effusion IMPRESSION: Left jugular dual-lumen catheter in good position. Improvement in bilateral edema. Electronically Signed   By: Franchot Gallo M.D.   On: 01/20/2017 08:41    Scheduled Meds: . aspirin EC  81 mg Oral Daily  . atorvastatin  40 mg Oral q1800  . bisacodyl  10 mg Oral Q6H  . budesonide (PULMICORT) nebulizer solution  0.5 mg Nebulization BID  . calcitRIOL  0.25 mcg Oral Daily  . epoetin (EPOGEN/PROCRIT) injection  8,000 Units Intravenous Q M,W,F-HD  . feeding supplement  1 Container Oral TID BM  . hydrALAZINE  50 mg Oral TID  . insulin aspart  0-15 Units Subcutaneous TID WC  . insulin aspart  0-5 Units Subcutaneous QHS  . ipratropium-albuterol  3 mL Nebulization Q6H  . loratadine  10 mg Oral Daily  . mouth rinse  15 mL Mouth Rinse BID  . metoprolol succinate  50 mg Oral Daily  . pantoprazole  40 mg Oral BID AC  . polyethylene glycol  17 g Oral Q1 Hr x 6   Continuous Infusions: . sodium chloride    . sodium chloride    . sodium chloride    .  sodium chloride    . sodium chloride      Principal Problem:   Acute metabolic encephalopathy Active Problems:   HTN (hypertension)   Mixed hyperlipidemia   Acute on chronic renal failure (HCC)   Elevated troponin   Acute on chronic diastolic CHF (congestive heart failure) (HCC)   Symptomatic anemia   Diabetes mellitus type 2 in obese (HCC)   CKD (chronic kidney disease), stage IV (HCC)   Acute respiratory failure with hypoxia (HCC)   Precordial pain   Acute renal failure superimposed on stage 4 chronic kidney disease ([place])   Palliative care encounter   Goals of  care, counseling/discussion   DNR (do not resuscitate) discussion    Time spent: >35 minutes     Kinnie Feil  [facility] Pager [phone]. If 7PM-7AM, please contact night-coverage at [email or web address], password [email or web address] 01/21/2017, 11:04 AM  LOS: 15 days

## 2017-01-22 ENCOUNTER — Inpatient Hospital Stay (HOSPITAL_COMMUNITY): Payer: 59

## 2017-01-22 ENCOUNTER — Encounter (HOSPITAL_COMMUNITY): Payer: Self-pay | Admitting: Gastroenterology

## 2017-01-22 ENCOUNTER — Encounter (HOSPITAL_COMMUNITY)
Admission: EM | Disposition: A | Discharge status: Skilled Nursing Facility | Payer: Self-pay | Source: Home / Self Care | Attending: Internal Medicine

## 2017-01-22 DIAGNOSIS — K2970 Gastritis, unspecified, without bleeding: Secondary | ICD-10-CM

## 2017-01-22 DIAGNOSIS — N186 End stage renal disease: Secondary | ICD-10-CM

## 2017-01-22 DIAGNOSIS — Z992 Dependence on renal dialysis: Secondary | ICD-10-CM

## 2017-01-22 DIAGNOSIS — K2990 Gastroduodenitis, unspecified, without bleeding: Secondary | ICD-10-CM

## 2017-01-22 DIAGNOSIS — I952 Hypotension due to drugs: Secondary | ICD-10-CM

## 2017-01-22 DIAGNOSIS — K2980 Duodenitis without bleeding: Secondary | ICD-10-CM

## 2017-01-22 HISTORY — PX: FLEXIBLE SIGMOIDOSCOPY: SHX5431

## 2017-01-22 HISTORY — PX: BIOPSY: SHX5522

## 2017-01-22 HISTORY — PX: ESOPHAGOGASTRODUODENOSCOPY: SHX5428

## 2017-01-22 LAB — RENAL FUNCTION PANEL
ALBUMIN: 3 g/dL — AB (ref 3.5–5.0)
ANION GAP: 10 (ref 5–15)
BUN: 26 mg/dL — ABNORMAL HIGH (ref 6–20)
CO2: 28 mmol/L (ref 22–32)
Calcium: 7.7 mg/dL — ABNORMAL LOW (ref 8.9–10.3)
Chloride: 99 mmol/L — ABNORMAL LOW (ref 101–111)
Creatinine, Ser: 2.81 mg/dL — ABNORMAL HIGH (ref 0.44–1.00)
GFR calc Af Amer: 19 mL/min — ABNORMAL LOW (ref >60)
GFR, EST NON AFRICAN AMERICAN: 16 mL/min — AB (ref >60)
GLUCOSE: 162 mg/dL — AB (ref 65–99)
PHOSPHORUS: 2.5 mg/dL (ref 2.5–4.6)
POTASSIUM: 3.6 mmol/L (ref 3.5–5.1)
SODIUM: 137 mmol/L (ref 135–145)

## 2017-01-22 LAB — LIPID PANEL
CHOL/HDL RATIO: 2.8 ratio
Cholesterol: 103 mg/dL (ref 0–200)
HDL: 37 mg/dL — AB (ref >40)
LDL Cholesterol: 45 mg/dL (ref 0–99)
Triglycerides: 106 mg/dL (ref <150)
VLDL: 21 mg/dL (ref 0–40)

## 2017-01-22 LAB — TYPE AND SCREEN
ABO/RH(D): A NEG
Antibody Screen: NEGATIVE
UNIT DIVISION: 0
UNIT DIVISION: 0
Unit division: 0
Unit division: 0

## 2017-01-22 LAB — GLUCOSE, CAPILLARY
GLUCOSE-CAPILLARY: 148 mg/dL — AB (ref 65–99)
Glucose-Capillary: 144 mg/dL — ABNORMAL HIGH (ref 65–99)
Glucose-Capillary: 159 mg/dL — ABNORMAL HIGH (ref 65–99)
Glucose-Capillary: 133 mg/dL — ABNORMAL HIGH (ref 65–99)

## 2017-01-22 LAB — BPAM RBC
BLOOD PRODUCT EXPIRATION DATE: 201812062359
BLOOD PRODUCT EXPIRATION DATE: 201812182359
BLOOD PRODUCT EXPIRATION DATE: 201812212359
Blood Product Expiration Date: 201811262359
ISSUE DATE / TIME: 201811211151
ISSUE DATE / TIME: 201811211106
ISSUE DATE / TIME: 201811230915
ISSUE DATE / TIME: 201811231359
UNIT TYPE AND RH: 600
UNIT TYPE AND RH: 600
Unit Type and Rh: 600
Unit Type and Rh: 600

## 2017-01-22 LAB — CBC
HCT: 31.2 % — ABNORMAL LOW (ref 36.0–46.0)
Hemoglobin: 10 g/dL — ABNORMAL LOW (ref 12.0–15.0)
MCH: 29 pg (ref 26.0–34.0)
MCHC: 32.1 g/dL (ref 30.0–36.0)
MCV: 90.4 fL (ref 78.0–100.0)
PLATELETS: 179 10*3/uL (ref 150–400)
RBC: 3.45 MIL/uL — ABNORMAL LOW (ref 3.87–5.11)
RDW: 16.5 % — AB (ref 11.5–15.5)
WBC: 10.6 10*3/uL — AB (ref 4.0–10.5)

## 2017-01-22 SURGERY — EGD (ESOPHAGOGASTRODUODENOSCOPY)
Anesthesia: Moderate Sedation

## 2017-01-22 MED ORDER — SODIUM CHLORIDE 0.9 % IV SOLN: INTRAVENOUS | Status: DC

## 2017-01-22 MED ORDER — METOPROLOL TARTRATE 5 MG/5ML IV SOLN
5.0000 mg | INTRAVENOUS | Status: DC | PRN
  Administered 2017-01-22 – 2017-01-25 (×3): 5 mg via INTRAVENOUS
  Filled 2017-01-22 (×3): qty 5

## 2017-01-22 MED ORDER — MEPERIDINE HCL 100 MG/ML IJ SOLN
INTRAMUSCULAR | Status: AC
  Filled 2017-01-22: qty 2

## 2017-01-22 MED ORDER — METOPROLOL SUCCINATE ER 50 MG PO TB24: 50.0000 mg | ORAL_TABLET | Freq: Two times a day (BID) | ORAL | Status: DC

## 2017-01-22 MED ORDER — DILTIAZEM HCL-DEXTROSE 100-5 MG/100ML-% IV SOLN (PREMIX): 5.0000 mg/h | INTRAVENOUS | Status: DC

## 2017-01-22 MED ORDER — FENTANYL CITRATE (PF) 100 MCG/2ML IJ SOLN
INTRAMUSCULAR | Status: DC | PRN
  Administered 2017-01-22 (×2): 25 ug via INTRAVENOUS

## 2017-01-22 MED ORDER — MIDAZOLAM HCL 5 MG/5ML IJ SOLN
INTRAMUSCULAR | Status: AC
  Filled 2017-01-22: qty 10

## 2017-01-22 MED ORDER — METOPROLOL TARTRATE 5 MG/5ML IV SOLN
INTRAVENOUS | Status: AC
  Filled 2017-01-22: qty 5

## 2017-01-22 MED ORDER — MIDAZOLAM HCL 5 MG/5ML IJ SOLN
INTRAMUSCULAR | Status: DC | PRN
  Administered 2017-01-22 (×4): 1 mg via INTRAVENOUS

## 2017-01-22 MED ORDER — SODIUM CHLORIDE 0.9% FLUSH
10.0000 mL | INTRAVENOUS | Status: DC | PRN
  Administered 2017-01-22: 10 mL

## 2017-01-22 MED ORDER — LIDOCAINE VISCOUS 2 % MT SOLN
OROMUCOSAL | Status: AC
  Filled 2017-01-22: qty 15

## 2017-01-22 MED ORDER — FENTANYL CITRATE (PF) 100 MCG/2ML IJ SOLN
INTRAMUSCULAR | Status: AC
  Filled 2017-01-22: qty 2

## 2017-01-22 MED ORDER — SODIUM CHLORIDE 0.9% FLUSH
10.0000 mL | Freq: Two times a day (BID) | INTRAVENOUS | Status: DC
  Administered 2017-01-22 – 2017-01-27 (×7): 10 mL

## 2017-01-22 MED ORDER — METOPROLOL TARTRATE 5 MG/5ML IV SOLN
INTRAVENOUS | Status: DC | PRN
  Administered 2017-01-22 (×2): 2.5 mg via INTRAVENOUS

## 2017-01-22 MED ORDER — METOPROLOL TARTRATE 50 MG PO TABS
50.0000 mg | ORAL_TABLET | Freq: Two times a day (BID) | ORAL | Status: DC
  Administered 2017-01-22 – 2017-01-27 (×7): 50 mg via ORAL
  Filled 2017-01-22 (×8): qty 1

## 2017-01-22 NOTE — Interval H&P Note (Signed)
History and Physical Interval Note:  01/22/2017 8:21 AM  Kelli [name]  has presented today for surgery, with the diagnosis of ANEMIA  The various methods of treatment have been discussed with the patient and family. After consideration of risks, benefits and other options for treatment, the patient has consented to  Procedure(s): COLONOSCOPY (N/A) ESOPHAGOGASTRODUODENOSCOPY (EGD) (N/A) as a surgical intervention .  The patient's history has been reviewed, patient examined, no change in status, stable for surgery.  I have reviewed the patient's chart and labs.  Questions were answered to the patient's satisfaction.     [employer]

## 2017-01-22 NOTE — Progress Notes (Signed)
PROGRESS NOTE                                                                                                                                                                                                             Patient Demographics:    Kelli [name], is a 66 y.o. female, DOB - 10-31-1950, VXB:[id number]  Admit date - 01/06/2017   Admitting Physician Karmen Bongo, MD  Outpatient Primary MD for the patient is [name], [name], MD  LOS - 16  Outpatient Specialists: none  Chief Complaint  Patient presents with  . Shortness of Breath  . Dysuria  . Chest Pain       Brief Narrative   66 year old female with a history of stroke with residual left hemiparesis, CKD stage III, essential hypertension, diabetes mellitus, peripheral vascular disease presentedwith 1 day history of shortness of breath, worsening leg edema, and chest discomfort. The patient also complains of a cough with clear sputum. She had subjective fevers and chills, but denied any headache, neck pain.T  -In ER: Hb 7.6. Chest x-ray showed pulmonary edema with BNP 966. The patientwas started on IV Lasix but remained fluid overloaded and her renal function continued to worsen. Repeat chest x-ray revealed pulmonary edema. Nephrology was consulted to assist with management and possible initiation of dialysis. Dr. Constance Haw placed VasCath on 01/11/17.       Subjective:   Patient sleepy after returning from EGD. Was tachycardic to 140s-150s and low BP in 70s . Was given IV lopressor with improvement. During the day HR again elevated to 140s. denies any pain or discomfort.    Assessment  & Plan :    Acute on chronic renal failure/acute on chronic diastolic CHF.  now ESRD. ,HD initiated 01/11/17,Tunnelled cath placed 01/14/17. Renal following. Has been CLIPed in [place] for HD on M,W,F ( first session on 11/26)   Acute blood loss anemia.  Hx of  duodenal ulcers on EGD in 2016. She was taken off asa and plavix at some point after that due to GI bleeding .  Received 5 u PRBC this admission. EGD this admission shows gastritis and duodenitis. GI recommends no aspirin and may use plavix if needed. Could not perform colonoscopy due to poor prep and will unlikely be able to done in future.  Hx of Stroke (cryptogenic) with left hemiparesis and  new acute stroke  has a loop recorder in place.  -Not on ASA at home due to hx fo GI bleed. Will place her on plavix. -MRI showed Possible small acute lacunar infarct in the right caudate nucleus. No associated hemorrhage or mass effect. Echo: LVEF-55%. No defect or patent foramen ovale was identified.  -patient denies acute or new focal neuro symptoms. She is not a good candidate for dual antiplatelets or anticoagulation therapy due to active melena. continue statin . Will place no plavix if h&h stale in am. ( per GI ok to use plavix if needed and avoid ASA due to severe gastritis and duodenitis). 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 -A1C 5.7.  Sinus arrhythmia with runs of SVT.  she has a loop recorded which was placed after a CVA in 2/15. minimize use of albuterol. Monitor electrolytes. Check TSH. switch toprol to metoprolol bid and add prn lopressor.     Acute respiratory failure with hypoxia (HCC) Secondary tofluid overload and COPD.  -improved. continue HD now for volume management.  completed steroids taper.continue PRN nebulizer. ( minimize  Albuterol given SVTs)  Leukocytosis.  Likely due to steroids. no signs of infection  Metabolic acidosis due to renal failure.  Cont HD  HTN (hypertension). Low BP post EGD. Stable now. hydralazine. toprol switched to metoprol bid.  Elevated troponin  most likely associated with CKD. no chest pain symptoms    Diabetes mellitus type 2 in obese.  continue SSI. a1c OF 5.7   Mixed  hyperlipidemia.  continue statin   Severe fatigue/ depression with flat affect.  Needs SNF     Code Status: full Family Communication: husband at bedside Disposition Plan: SNF likely next week. Monday if stable    Consultants:  nephro  Palliative care  IR    Procedures:  HD cath by surgery  Tunneled dialysis cath per IR    Lab Results  Component Value Date   PLT 179 01/22/2017    Antibiotics  :    Anti-infectives (From admission, onward)   Start     Dose/Rate Route Frequency Ordered Stop   01/14/17 1415  ceFAZolin (ANCEF) IVPB 2g/100 mL premix     2 g 200 mL/hr over 30 Minutes Intravenous To Radiology 01/14/17 1413 01/14/17 1455        Objective:   Vitals:   01/22/17 1019 01/22/17 1124 01/22/17 1159 01/22/17 1438  BP: 92/66 (!) 157/124 122/90 121/84  Pulse: (!) 149  (!) 119   Resp: (!) 22     Temp: 98.5 F (36.9 C)     TempSrc: Axillary     SpO2: 95%   97%  Weight:      Height:        Wt Readings from Last 3 Encounters:  01/21/17 82 kg (180 lb 12.4 oz)  12/28/16 83.5 kg (184 lb)  11/18/16 83.5 kg (184 lb)     Intake/Output Summary (Last 24 hours) at 01/22/2017 1517 Last data filed at 01/22/2017 1300 Gross per 24 hour  Intake 360 ml  Output 2750 ml  Net -2390 ml     Physical Exam  Gen: fatigue and sleepy HEENT:  pallor+, moist mucosa, supple neck Chest: clear b/l, no added sounds CVS: S1&S2 tachycardic , no murmurs, rubs or gallop GI: soft, NT, ND, BS+ Musculoskeletal: warm, no edema CNS: AAOX3, non focal    Data Review:    CBC Recent Labs  Lab 01/19/17 0412 01/20/17  8757 01/21/17 0451 01/21/17 1552 01/21/17 2224 01/22/17 0443  WBC 11.6* 11.2* 8.0 9.8  --  10.6*  HGB 6.1* 7.3* 6.6* 9.8* 9.3* 10.0*  HCT 20.2* 22.9* 21.0* 30.0* 28.4* 31.2*  PLT 212 195 174 168  --  179  MCV 92.2 88.4 90.9 88.5  --  90.4  MCH 27.9 28.2 28.6 28.9  --  29.0  MCHC 30.2 31.9 31.4 32.7  --  32.1  RDW 15.1 15.7* 16.1* 15.5  --   16.5*    Chemistries  Recent Labs  Lab 01/16/17 0644 01/18/17 0738 01/19/17 0412 01/21/17 0451 01/22/17 0443  NA 136 135 133* 133* 137  K 3.6 3.8 4.3 3.8 3.6  CL 99* 98* 97* 97* 99*  CO2 27 30 28 28 28   GLUCOSE 127* 128* 148* 122* 162*  BUN 63* 30* 70* 39* 26*  CREATININE 4.02* 2.79* 3.44* 3.45* 2.81*  CALCIUM 6.7* 6.8* 6.6* 6.8* 7.7*   ------------------------------------------------------------------------------------------------------------------ Recent Labs    01/22/17 0443  CHOL 103  HDL 37*  LDLCALC 45  TRIG 106  CHOLHDL 2.8    Lab Results  Component Value Date   HGBA1C 5.7 (H) 10/15/2016   ------------------------------------------------------------------------------------------------------------------ No results for input(s): TSH, T4TOTAL, T3FREE, THYROIDAB in the last 72 hours.  Invalid input(s): FREET3 ------------------------------------------------------------------------------------------------------------------ No results for input(s): VITAMINB12, FOLATE, FERRITIN, TIBC, IRON, RETICCTPCT in the last 72 hours.  Coagulation profile No results for input(s): INR, PROTIME in the last 168 hours.  No results for input(s): DDIMER in the last 72 hours.  Cardiac Enzymes No results for input(s): CKMB, TROPONINI, MYOGLOBIN in the last 168 hours.  Invalid input(s): CK ------------------------------------------------------------------------------------------------------------------    Component Value Date/Time   BNP 966.0 (H) 01/06/2017 1655    Inpatient Medications  Scheduled Meds: . atorvastatin  40 mg Oral q1800  . budesonide (PULMICORT) nebulizer solution  0.5 mg Nebulization BID  . calcitRIOL  0.25 mcg Oral Daily  . epoetin (EPOGEN/PROCRIT) injection  8,000 Units Intravenous Q M,W,F-HD  . feeding supplement  1 Container Oral TID BM  . hydrALAZINE  50 mg Oral TID  . insulin aspart  0-15 Units Subcutaneous TID WC  . insulin aspart  0-5 Units  Subcutaneous QHS  . ipratropium-albuterol  3 mL Nebulization Q6H  . loratadine  10 mg Oral Daily  . mouth rinse  15 mL Mouth Rinse BID  . metoprolol succinate  50 mg Oral Daily  . pantoprazole  40 mg Oral BID AC   Continuous Infusions: . sodium chloride    . sodium chloride    . sodium chloride    . sodium chloride    . sodium chloride    . sodium chloride    . sodium chloride     PRN Meds:.sodium chloride, sodium chloride, sodium chloride, sodium chloride, sodium chloride, sodium chloride, acetaminophen, alteplase, alum & mag hydroxide-simeth, lidocaine (PF), lidocaine-prilocaine, ondansetron (ZOFRAN) IV, ondansetron (ZOFRAN) IV, pentafluoroprop-tetrafluoroeth  Micro Results No results found for this or any previous visit (from the past 240 hour(s)).  Radiology Reports Ct Abdomen Pelvis Wo Contrast  Result Date: 01/07/2017 CLINICAL DATA:  Abdominal pain. Decreased hemoglobin. Clinical suspicion for retroperitoneal hemorrhage. EXAM: CT ABDOMEN AND PELVIS WITHOUT CONTRAST TECHNIQUE: Multidetector CT imaging of the abdomen and pelvis was performed following the standard protocol without IV contrast. COMPARISON:  None. FINDINGS: Lower chest: Bibasilar heterogeneous airspace disease and small bilateral pleural effusions. Hepatobiliary: No mass visualized on this unenhanced exam. Gallbladder is unremarkable. Pancreas: No mass or inflammatory process visualized on this unenhanced  exam. Spleen:  Within normal limits in size. Adrenals/Urinary tract: Bilateral renal parenchymal atrophy, left side greater than right, consistent with history of chronic kidney disease. Renal vascular calcification noted. Several tiny calculi are seen within the left renal collecting system and proximal left ureter, without hydronephrosis. Unremarkable unopacified urinary bladder. Stomach/Bowel: No evidence of obstruction, inflammatory process, or abnormal fluid collections. Normal appendix visualized. Mild left-sided  colonic diverticulosis seen, without evidence of diverticulitis. Vascular/Lymphatic: No pathologically enlarged lymph nodes identified. Infrarenal abdominal aortic aneurysm measuring 3.4 x 3.3 cm. Reproductive: Prior hysterectomy noted. Adnexal regions are unremarkable in appearance. Other: No evidence of retroperitoneal hemorrhage or hemoperitoneum. Mild edema in subcutaneous tissues. Musculoskeletal:  No suspicious bone lesions identified. IMPRESSION: No evidence of retroperitoneal hemorrhage or hemoperitoneum. Several tiny calculi in left renal collecting system and proximal left ureter, without hydronephrosis. Bilateral renal parenchymal atrophy, left side greater than right. Colonic diverticulosis, without radiographic evidence of diverticulitis. Small bilateral pleural effusions and bilateral lower lobe airspace disease. 3.4 cm infrarenal abdominal aortic aneurysm. Recommend followup by ultrasound in 3 years. This recommendation follows ACR consensus guidelines: White Paper of the ACR Incidental Findings Committee II on Vascular Findings. Natasha [facility] Coll Radiol 2013; 10:789-794 Electronically Signed   By: Earle Gell M.D.   On: 01/07/2017 10:22   Dg Chest 2 View  Result Date: 01/06/2017 CLINICAL DATA:  Trouble breathing x1 day with tightness and congestion in chest. Weakness. Hx of diabetes, HTN-controlled with medication, and low hemoglobin. 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 EXAM: CHEST  2 VIEW COMPARISON:  10/19/2016 FINDINGS: The heart is enlarged. There is pulmonary vascular congestion. Prominent interstitial markings are consistent with mild edema. Trace bilateral pleural effusions are also suspected. There is mild bibasilar atelectasis. IMPRESSION: Cardiomegaly and mild pulmonary edema. Electronically Signed   By: Nolon Nations M.D.   On: 01/06/2017 16:56   Ct Head Wo Contrast  Result Date: 01/19/2017 CLINICAL DATA:  Unexplained altered level of consciousness. EXAM: CT HEAD  WITHOUT CONTRAST TECHNIQUE: Contiguous axial images were obtained from the base of the skull through the vertex without intravenous contrast. COMPARISON:  Brain MRI 09/17/2013 FINDINGS: Brain: Confluent low-density in the cerebral white matter consistent with chronic small vessel ischemia. Remote moderate to large right PCA distribution affecting the inferior temporal and occipital lobes. Small remote high right posterior frontal and left parietal cortex infarcts. Patchy indistinct bilateral deep gray nuclei consistent with chronic small vessel ischemic injury. Remote right pontine infarct. Remote small vessel left cerebellar infarct. The left thalamus is asymmetrically low-density compared to the right common mildly expanded. This could reflect a recent infarct. No hemorrhage, hydrocephalus, or shift. Vascular: Atherosclerotic calcification. Small right ICA at the skullbase. There was right ICA occlusion on prior exam. No hyperdense vessel Skull: No acute or aggressive finding. Sinuses/Orbits: No acute finding IMPRESSION: 1. Compared to 2015 there is patchy low-density in the left thalamus with mild expansion. This may reflect a recent infarct if there is associated deficit. MRI could characterize if clinically appropriate. 2. Advanced chronic ischemic injury as noted above. 3. 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 Electronically Signed   By: Monte Fantasia M.D.   On: 01/19/2017 15:50   Mr Brain Wo Contrast  Result Date: 01/21/2017 CLINICAL DATA:  66 year old female with altered mental status for 2 days. Abnormal density in the left thalamus new compared to 2015 on recent head CT. EXAM: MRI HEAD WITHOUT CONTRAST TECHNIQUE: Multiplanar, multiecho pulse sequences of the brain and  surrounding structures were obtained without intravenous contrast. COMPARISON:  Head CT without contrast 01/19/2017. Brain MRI 09/17/2013. FINDINGS: Brain: The only possible focus of acutely restricted  diffusion is a small area in the right caudate nucleus on series 3, image 87. Mild T2 and FLAIR hyperintensity with no hemorrhage or mass effect. Severe chronic ischemic disease. Cortical encephalomalacia in the right greater than left MCA and right PCA territories with advanced Wallerian degeneration in the right brainstem. Progressed since 2015 and confluent abnormal signal in the left deep gray matter nuclei, but mostly facilitated on diffusion indicating subacute to chronic time course. Progressed chronic small vessel ischemia in the left cerebellum since 2015. Progressed patchy and confluent left MCA territory white matter T2 and FLAIR hyperintensity. Scattered post ischemic hemosiderin and occasional other chronic micro hemorrhages. No midline shift, mass effect, evidence of mass lesion, ventriculomegaly, extra-axial collection or acute intracranial hemorrhage. Cervicomedullary junction and pituitary are within normal limits. 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 Other Major intracranial vascular flow voids are stable. The distal right vertebral artery is dominant. Skull and upper cervical spine: Negative. Visualized bone marrow signal is within normal limits. Sinuses/Orbits: Stable orbits soft tissues. Mild paranasal sinus mucosal thickening has not significantly changed. Other: Mild left mastoid effusion is new but significance is doubtful. Small volume retained secretions in the left nasopharynx. Right mastoids are clear. Scalp and face soft tissues appear negative. IMPRESSION: 1. Possible small acute lacunar infarct in the right caudate nucleus. No associated hemorrhage or mass effect. 2. No other acute ischemia identified, but very severe chronic ischemic disease. Progression in the left deep gray matter nuclei and the left cerebellum since 2015. Right greater than left chronic MCA and right PCA territory encephalomalacia with advanced Wallerian degeneration in the right  brainstem. 3. Chronic right ICA occlusion. Electronically Signed   By: Genevie Ann M.D.   On: 01/21/2017 07:56   US Carotid Bilateral  Result Date: 01/22/2017 CLINICAL DATA:  CVA. 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 Status post left carotid endarterectomy in 2014. EXAM: BILATERAL CAROTID DUPLEX ULTRASOUND TECHNIQUE: Pearline Cables scale imaging, color Doppler and duplex ultrasound were performed of bilateral carotid and vertebral arteries in the neck. COMPARISON:  Brain MRI 01/21/2017. Carotid duplex report from 06/11/2014 FINDINGS: Criteria: Quantification of carotid stenosis is based on velocity parameters that correlate the residual internal carotid diameter with NASCET-based stenosis levels, using the diameter of the distal internal carotid lumen as the denominator for stenosis measurement. The following velocity measurements were obtained: RIGHT ICA:  Occluded CCA:  94 cm/sec ECA:  234 cm/sec LEFT ICA:  165 cm/sec CCA:  076 cm/sec SYSTOLIC ICA/CCA RATIO:  1.2 DIASTOLIC ICA/CCA RATIO:  1.8 ECA:  176 cm/sec RIGHT CAROTID ARTERY: Heterogeneous plaque in the proximal common carotid artery. Irregular echogenic plaque at the right carotid bulb. Occlusion of the internal carotid artery near the origin. Right external carotid artery is patent. RIGHT VERTEBRAL ARTERY: Antegrade flow and normal waveform in the right vertebral artery. LEFT CAROTID ARTERY: Small amount of plaque in left common carotid artery. Left carotid bulb is patulous and compatible with previous endarterectomy. External carotid artery is patent with normal waveform. Small amount of plaque in the left internal carotid artery. Mild tortuosity in the distal internal carotid artery with a slightly elevated peak systolic velocity measuring up to 165 cm/sec. LEFT VERTEBRAL ARTERY: Retrograde flow in the left vertebral artery. This finding appears chronic based on the previous ultrasound report.  IMPRESSION: Chronic  occlusion of the right internal carotid artery. Surgical changes compatible with a left carotid endarterectomy. Mild plaque and tortuosity in the left internal carotid artery. Estimated degree of stenosis in the left internal carotid artery is less than 50%. Retrograde flow in the left vertebral artery. 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 This appears to be a chronic finding. Antegrade flow in the right vertebral artery. Electronically Signed   By: Markus Daft M.D.   On: 01/22/2017 08:59   US Venous Img Lower Bilateral  Result Date: 01/19/2017 CLINICAL DATA:  Bilateral lower extremity swelling. EXAM: BILATERAL LOWER EXTREMITY VENOUS DOPPLER ULTRASOUND TECHNIQUE: Gray-scale sonography with graded compression, as well as color Doppler and duplex ultrasound were performed to evaluate the lower extremity deep venous systems from the level of the common femoral vein and including the common femoral, femoral, profunda femoral, popliteal and calf veins including the posterior tibial, peroneal and gastrocnemius veins when visible. The superficial great saphenous vein was also interrogated. Spectral Doppler was utilized to evaluate flow at rest and with distal augmentation maneuvers in the common femoral, femoral and popliteal veins. COMPARISON:  10/13/2016 FINDINGS: RIGHT LOWER EXTREMITY Common Femoral Vein: No evidence of thrombus. Normal compressibility, respiratory phasicity and response to augmentation. Saphenofemoral Junction: No evidence of thrombus. Normal compressibility and flow on color Doppler imaging. Profunda Femoral Vein: No evidence of thrombus. Normal compressibility and flow on color Doppler imaging. Femoral Vein: No evidence of thrombus. Normal compressibility, respiratory phasicity and response to augmentation. Popliteal Vein: No evidence of thrombus. Normal compressibility, respiratory phasicity and response to augmentation. Calf Veins: No  evidence of thrombus. Normal compressibility and flow on color Doppler imaging. Superficial Great Saphenous Vein: No evidence of thrombus. Normal compressibility. Venous Reflux:  None. Other Findings:  None. LEFT LOWER EXTREMITY Common Femoral Vein: No evidence of thrombus. Normal compressibility, respiratory phasicity and response to augmentation. Saphenofemoral Junction: No evidence of thrombus. Normal compressibility and flow on color Doppler imaging. Profunda Femoral Vein: No evidence of thrombus. Normal compressibility and flow on color Doppler imaging. Femoral Vein: No evidence of thrombus. Normal compressibility, respiratory phasicity and response to augmentation. Popliteal Vein: No evidence of thrombus. Normal compressibility, respiratory phasicity and response to augmentation. Calf Veins: No evidence of thrombus. Normal compressibility and flow on color Doppler imaging. Superficial Great Saphenous Vein: No evidence of thrombus. Normal compressibility. Venous Reflux:  None. Other Findings:  None. IMPRESSION: No evidence of deep venous thrombosis. Electronically Signed   By: Rolm Baptise M.D.   On: 01/19/2017 15:35   Ir Fluoro Guide Cv Line Left  Result Date: 01/14/2017 INDICATION: End-stage renal disease, in need of durable intravenous access for continuation of dialysis. EXAM: TUNNELED CENTRAL VENOUS HEMODIALYSIS CATHETER PLACEMENT WITH ULTRASOUND AND FLUOROSCOPIC GUIDANCE MEDICATIONS: Ancef 2 gm IV . The antibiotic was given in an appropriate time interval prior to skin puncture. ANESTHESIA/SEDATION: Versed 2 mg IV; Fentanyl 50 mcg IV; Moderate Sedation Time:  16 minutes The patient was continuously monitored during the procedure by the interventional radiology nurse under my direct supervision. FLUOROSCOPY TIME:  Fluoroscopy Time: 36 seconds (5 mGy). COMPLICATIONS: None immediate. PROCEDURE: Informed written consent was obtained from the patient after a discussion of the risks, benefits, and  alternatives to treatment. Questions regarding the procedure were encouraged and answered. The left neck and chest were prepped with chlorhexidine in a sterile fashion, and a sterile drape was applied covering the operative field. Maximum barrier sterile technique with sterile gowns and gloves  were used for the procedure. A timeout was performed prior to the initiation of the procedure. After creating a small venotomy incision, a micropuncture kit was utilized to access the internal jugular vein. Real-time ultrasound guidance was utilized for vascular access including the acquisition of a permanent ultrasound image documenting patency of the accessed vessel. The microwire was utilized to measure appropriate catheter length. A stiff Glidewire was advanced to the level of the IVC and the micropuncture sheath was exchanged for a peel-away sheath. A palindrome tunneled hemodialysis catheter measuring 23 cm from tip to cuff was tunneled in a retrograde fashion from the anterior chest wall to the venotomy incision. The catheter was then placed through the peel-away sheath with tips ultimately positioned within the superior aspect of the right atrium. Final catheter positioning was confirmed and documented with a spot radiographic image. The catheter aspirates and flushes normally. The catheter was flushed with appropriate volume heparin dwells. The catheter exit site was secured with a 0-Prolene retention suture. The venotomy incision was closed with an interrupted 4-0 Vicryl, Dermabond and Steri-strips. Dressings were applied. The patient tolerated the procedure well without immediate post procedural complication. IMPRESSION: Successful placement of 23 cm tip to cuff tunneled hemodialysis catheter via the left internal jugular vein with tips terminating within the superior aspect of the right atrium. The catheter is ready for immediate use. Electronically Signed   By: Sandi Mariscal M.D.   On: 01/14/2017 15:28   Ir US  Guide Vasc Access Left  Result Date: 01/14/2017 INDICATION: End-stage renal disease, in need of durable intravenous access for continuation of dialysis. EXAM: TUNNELED CENTRAL VENOUS HEMODIALYSIS CATHETER PLACEMENT WITH ULTRASOUND AND FLUOROSCOPIC GUIDANCE MEDICATIONS: Ancef 2 gm IV . The antibiotic was given in an appropriate time interval prior to skin puncture. ANESTHESIA/SEDATION: Versed 2 mg IV; Fentanyl 50 mcg IV; Moderate Sedation Time:  16 minutes The patient was continuously monitored during the procedure by the interventional radiology nurse under my direct supervision. FLUOROSCOPY TIME:  Fluoroscopy Time: 36 seconds (5 mGy). COMPLICATIONS: None immediate. PROCEDURE: Informed written consent was obtained from the patient after a discussion of the risks, benefits, and alternatives to treatment. Questions regarding the procedure were encouraged and answered. The left neck and chest were prepped with chlorhexidine in a sterile fashion, and a sterile drape was applied covering the operative field. Maximum barrier sterile technique with sterile gowns and gloves were used for the procedure. A timeout was performed prior to the initiation of the procedure. After creating a small venotomy incision, a micropuncture kit was utilized to access the internal jugular vein. Real-time ultrasound guidance was utilized for vascular access including the acquisition of a permanent ultrasound image documenting patency of the accessed vessel. The microwire was utilized to measure appropriate catheter length. A stiff Glidewire was advanced to the level of the IVC and the micropuncture sheath was exchanged for a peel-away sheath. A palindrome tunneled hemodialysis catheter measuring 23 cm from tip to cuff was tunneled in a retrograde fashion from the anterior chest wall to the venotomy incision. The catheter was then placed through the peel-away sheath with tips ultimately positioned within the superior aspect of the right  atrium. Final catheter positioning was confirmed and documented with a spot radiographic image. The catheter aspirates and flushes normally. The catheter was flushed with appropriate volume heparin dwells. The catheter exit site was secured with a 0-Prolene retention suture. The venotomy incision was closed with an interrupted 4-0 Vicryl, Dermabond and Steri-strips. Dressings were applied. The patient  tolerated the procedure well without immediate post procedural complication. IMPRESSION: Successful placement of 23 cm tip to cuff tunneled hemodialysis catheter via the left internal jugular vein with tips terminating within the superior aspect of the right atrium. The catheter is ready for immediate use. Electronically Signed   By: Sandi Mariscal M.D.   On: 01/14/2017 15:28   Dg Chest Port 1 View  Result Date: 01/20/2017 CLINICAL DATA:  Altered mental status short of breath EXAM: PORTABLE CHEST 1 VIEW COMPARISON:  01/09/2017 FINDINGS: Interval placement of left jugular dual-lumen catheter with tip in the SVC. Right jugular catheter tip in the SVC unchanged. No pneumothorax Interval improvement in diffuse bilateral airspace disease. Mild vascular congestion remains with left lower lobe atelectasis and small left effusion IMPRESSION: Left jugular dual-lumen catheter in good position. Improvement in bilateral edema. Electronically Signed   By: Franchot Gallo M.D.   On: 01/20/2017 08:41   Dg Chest Port 1 View  Result Date: 01/09/2017 CLINICAL DATA:  CHF EXAM: PORTABLE CHEST 1 VIEW COMPARISON:  01/07/2017 FINDINGS: Severe bilateral airspace disease similar to the prior study. Probable congestive heart failure with edema. Pneumonia not excluded. Bibasilar atelectasis and small left effusion. Right jugular central venous catheter tip in the SVC unchanged IMPRESSION: Diffuse bilateral airspace disease unchanged. Probable heart failure. Electronically Signed   By: Franchot Gallo M.D.   On: 01/09/2017 11:27   Dg Chest  Port 1 View  Result Date: 01/07/2017 CLINICAL DATA:  66 year old female central line placement. EXAM: PORTABLE CHEST 1 VIEW COMPARISON:  1016 hours today and earlier. FINDINGS: Portable AP semi upright view at 1432 hours. New right IJ approach central line is in place, tip projects at the level of the right mainstem bronchus corresponding to the lower SVC. No pneumothorax. Stable cardiac size and mediastinal contours. Stable bilateral ventilation since this morning including dense retrocardiac opacity which has progressed since 01/06/2017. Visualized tracheal air column is within normal limits. IMPRESSION: 1. Right IJ central line tip at the lower SVC level. No pneumothorax or adverse features. 2. Stable ventilation since 1016 hours today. Electronically Signed   By: Genevie Ann M.D.   On: 01/07/2017 15:02   Dg Chest Port 1 View  Result Date: 01/07/2017 CLINICAL DATA:  Respiratory distress. EXAM: PORTABLE CHEST 1 VIEW COMPARISON:  01/06/2017 FINDINGS: Lungs are adequately inflated with worsening patchy multifocal airspace opacification most notable over the left midlung and right upper lobe likely a multifocal infectious process. Possible small amount left pleural fluid. Mild stable cardiomegaly. Remainder of the exam is unchanged. IMPRESSION: Slight worsening bilateral multifocal airspace process most notable over the left midlung and right upper lobe suggesting multifocal infection. Stable cardiomegaly. Electronically Signed   By: Marin Olp M.D.   On: 01/07/2017 10:37    Time Spent in minutes  35   Najae Rathert M.D on 01/22/2017 at 3:17 PM  Between 7am to 7pm - Pager - [phone]  After 7pm go to [email or web address] - password [email or web address]  [facility] -  Office  [phone]

## 2017-01-22 NOTE — Procedures (Signed)
INDICATION: ANEMIA. ATTEMPTED TO PREP PT FOR 2 DAYS WITH MIRALAX.  PRE-OPERATIVE DIAGNOSIS: ANEMIA POST-OPERATIVE DIAGNOSIS:  ANEMIA  PROCEDURE:  Procedure(s): PROCTOSCOPY, INCOMPLETE COLONOSCOPY  SURGEON:  Surgeon(s): Dorothyann Peng, MD  MEDS:  FENTANYL 50 MCG IV VERSED 4 MG IV  PLAN OF CARE:  PROCEED TO EGD   Procedure technique: Pt  prepped and draped. MEDS REVIEWED. COLONOSCOPE INSERTED. FORMED STOOL IN RECTUM AND UNABLE TO PASS SCOPE BEYOND 20 CM. SCOPE WITHDRAWN. Pt tolerated procedure well.

## 2017-01-22 NOTE — Progress Notes (Signed)
PT Cancellation Note  Patient Details Name: QUANIYA DAMAS MRN: [id number] DOB: 1951-02-02   Cancelled Treatment:    Reason Eval/Treat Not Completed: Fatigue/lethargy limiting ability to participate. Pt had gastro procedure this morning and is still very lethargic from that. PT had difficulty keeping pt awake during conversation so treatment not appropriate at this time. Will check back at a later time/date.    Geraldine Solar PT, DPT

## 2017-01-22 NOTE — Progress Notes (Signed)
Patient going into rapid afib with HR in 120s-140s. Did not repsond to IV lopressor. Start cardizem drip and transfer to ICU. Her CHADS2vasc score is 7 and with acute stroke she is would benefit from being on anticoagulation but given her melena with multiple transfusions recently and weighing risks vs benefit it would be better to monitor her off anticoagulation. Discussed this with patients husband at length at bedside.

## 2017-01-22 NOTE — Progress Notes (Signed)
Reported to Loni Muse, RN in ICU, transferred to ICU in stable condition.

## 2017-01-22 NOTE — Progress Notes (Signed)
**Note De-Identified  Obfuscation** STAT EKG complete; results reported to Dr. Clementeen Graham and placed in patient chart

## 2017-01-22 NOTE — Op Note (Signed)
[facility] Patient Name: Kelli [name] Procedure Date: 01/22/2017 7:31 AM MRN: [id number] Date of Birth: 06/01/1950 Attending MD: [name] MD, MD CSN: [id number] Age: 66 Admit Type: Inpatient Procedure:                PROCTOSCOPY-FAILED ATTEMPT AT COLONOSCOPY Indications:              Anemia Providers:                [name] MD, MD, Lurline Del, RN, Aram Candela Referring MD:             Manon Hilding MD, MD Medicines:                Fentanyl 50 micrograms IV, Midazolam 4 mg IV Complications:            No immediate complications. Estimated Blood Loss:     Estimated blood loss: none. Procedure:                Pre-Anesthesia Assessment:                           - Prior to the procedure, a History and Physical                            was performed, and patient medications and                            allergies were reviewed. The patient's tolerance of                            previous anesthesia was also reviewed. The risks                            and benefits of the procedure and the sedation                            options and risks were discussed with the patient.                            All questions were answered, and informed consent                            was obtained. Prior Anticoagulants: The patient has                            taken no previous anticoagulant or antiplatelet                            agents. ASA Grade Assessment: III - A patient with                            severe systemic disease. After reviewing the risks                            and benefits, the patient was deemed in  satisfactory condition to undergo the procedure.                            After obtaining informed consent, the colonoscope                            was passed under direct vision. Throughout the                            procedure, the patient's blood pressure, pulse, and                            oxygen saturations  were monitored continuously. The                            EC-[id number] ([id number]) scope was introduced through                            the anus and advanced to the the rectum. The rectum                            was photographed. The colonoscopy was technically                            difficult and complex due to poor bowel prep with                            stool present. The patient tolerated the procedure                            well. The quality of the bowel preparation was poor. Scope In: 8:49:12 AM Scope Out: 8:51:30 AM Total Procedure Duration: 0 hours 2 minutes 18 seconds  Findings:      Solid stool was found in the rectum and in the recto-sigmoid colon,       precluding visualization. Impression:               - Preparation of the colon was poor.                           - Stool in the rectum and in the recto-sigmoid                            colon. Moderate Sedation:      Moderate (conscious) sedation was administered by the endoscopy nurse       and supervised by the endoscopist. The following parameters were       monitored: oxygen saturation, heart rate, blood pressure, and response       to care. Total physician intraservice time was 29 minutes. Recommendation:           - Resume previous diet.                           - Continue present medications.                           -  Patient has a contact number available for                            emergencies. The signs and symptoms of potential                            delayed complications were discussed with the                            patient. Return to normal activities tomorrow.                            Written discharge instructions were provided to the                            patient.                           - - No repeat colonoscopy due to PT UNABLE TO PREP                            FOR PROCEDURE. Procedure Code(s):        --- Professional ---                           [id number], 53,  Colonoscopy, flexible; diagnostic,                            including collection of specimen(s) by brushing or                            washing, when performed (separate procedure)                           99152, Moderate sedation services provided by the                            same physician or other qualified health care                            professional performing the diagnostic or                            therapeutic service that the sedation supports,                            requiring the presence of an independent trained                            observer to assist in the monitoring of the                            patient's level of consciousness and physiological  status; initial 15 minutes of intraservice time,                            patient age 68 years or older                           [phone], Moderate sedation services; each additional                            15 minutes intraservice time Diagnosis Code(s):        --- Professional ---                           D64.9, Anemia, unspecified CPT copyright 2016 [employer]. All rights reserved. The codes documented in this report are preliminary and upon coder review may  be revised to meet current compliance requirements. [name], MD [name] MD, MD 01/22/2017 10:48:24 AM This report has been signed electronically. Number of Addenda: 0

## 2017-01-22 NOTE — Op Note (Addendum)
[facility] Patient Name: Kelli [name] Procedure Date: 01/22/2017 7:34 AM MRN: [id number] Date of Birth: 10-20-1950 Attending MD: [name] MD, MD CSN: [id number] Age: 66 Admit Type: Inpatient Procedure:                Upper GI endoscopy WITH COLD FORCEPS BIOPSY Indications:              Anemia ON ASA OR PLAVIX DUE TO ACUTE AND REMOTE                            CVAs.PT ARRIVED IN ENDO WITH HR 160S. Providers:                [name] MD, MD, Lurline Del, RN, Aram Candela Referring MD:             Manon Hilding MD, MD Medicines:                Fentanyl 50 micrograms IV, Midazolam 3 mg                            IV,LOPRESSOR 2.5 MG IV Complications:            Atrial fibrillation-PT GIVEN LOPRESSOR 2.5 MG IV x2                            HR DECREASED TO 120s. SBP 75-85 DURING ENDOSCOPY.                            RETURNED TO FLOOR WITH SBP 116. Estimated Blood Loss:     Estimated blood loss was minimal. Procedure:                Pre-Anesthesia Assessment:                           - Prior to the procedure, a History and Physical                            was performed, and patient medications and                            allergies were reviewed. The patient's tolerance of                            previous anesthesia was also reviewed. The risks                            and benefits of the procedure and the sedation                            options and risks were discussed with the patient.                            All questions were answered, and informed consent                            was obtained. Prior Anticoagulants: The  patient has                            taken aspirin, last dose was 1 day prior to                            procedure. ASA Grade Assessment: III - A patient                            with severe systemic disease. After reviewing the                            risks and benefits, the patient was deemed in   satisfactory condition to undergo the procedure.                            After obtaining informed consent, the endoscope was                            passed under direct vision. Throughout the                            procedure, the patient's blood pressure, pulse, and                            oxygen saturations were monitored continuously. The                            [id number] ([id number]) scope was introduced through the                            mouth, and advanced to the second part of duodenum.                            The upper GI endoscopy was accomplished without                            difficulty. The patient tolerated the procedure                            well. Scope In: 8:59:25 AM Scope Out: 9:06:49 AM Total Procedure Duration: 0 hours 7 minutes 24 seconds  Findings:      The examined esophagus was normal.      A medium-sized hiatal hernia was present.      Diffuse moderate inflammation characterized by congestion (edema),       erosions and erythema was found in the entire examined stomach. Biopsies       were taken with a cold forceps for Helicobacter pylori testing.      Diffuse moderate inflammation characterized by congestion (edema),       erythema and friability was found in the duodenal bulb and in the second       portion of the duodenum. Impression:               - Normal esophagus.                           -  Medium-sized hiatal hernia.                           - ANEMIA PARTIALLY DUE TO MODERATE TO SEVERE                            Gastritis & Duodenitis. Moderate Sedation:      Moderate (conscious) sedation was administered by the endoscopy nurse       and supervised by the endoscopist. The following parameters were       monitored: oxygen saturation, heart rate, blood pressure, and response       to care. Total physician intraservice time was 29 minutes. Recommendation:           - Await pathology results. CLOSELY FOLLOW CBC AND                             TRANSFUSE WHEN NEEDED. CONSIDER IVFE AS WELL.                           - Resume previous diet.                           - Continue present medications. STOP ASA. COULD USE                            PLAVIX. CONTINUE PROTONIX BID. PT UNABLE TO PREP                            FOR A COLONOSCOPY AND NO ALTERNATIVE TO TCS EXISTS                            THAT PT WILL LIKELY TOLERATE.                           - Return patient to hospital ward for ongoing care. Procedure Code(s):        --- Professional ---                           [id number], Esophagogastroduodenoscopy, flexible,                            transoral; with biopsy, single or multiple                           99152, Moderate sedation services provided by the                            same physician or other qualified health care                            professional performing the diagnostic or                            therapeutic service that the sedation supports,  requiring the presence of an independent trained                            observer to assist in the monitoring of the                            patient's level of consciousness and physiological                            status; initial 15 minutes of intraservice time,                            patient age 3 years or older                           [phone], Moderate sedation services; each additional                            15 minutes intraservice time Diagnosis Code(s):        --- Professional ---                           K44.9, Diaphragmatic hernia without obstruction or                            gangrene                           K29.70, Gastritis, unspecified, without bleeding                           K29.80, Duodenitis without bleeding                           D64.9, Anemia, unspecified CPT copyright 2016 American [employer] Association. All rights reserved. The codes documented in this report are preliminary and  upon coder review may  be revised to meet current compliance requirements. [name], MD [name] MD, MD 01/22/2017 9:20:15 AM This report has been signed electronically. Number of Addenda: 0

## 2017-01-23 DIAGNOSIS — L8990 Pressure ulcer of unspecified site, unspecified stage: Secondary | ICD-10-CM

## 2017-01-23 DIAGNOSIS — I4891 Unspecified atrial fibrillation: Secondary | ICD-10-CM

## 2017-01-23 LAB — CBC
HEMATOCRIT: 26.3 % — AB (ref 36.0–46.0)
HEMOGLOBIN: 8.2 g/dL — AB (ref 12.0–15.0)
MCH: 28.8 pg (ref 26.0–34.0)
MCHC: 31.2 g/dL (ref 30.0–36.0)
MCV: 92.3 fL (ref 78.0–100.0)
Platelets: 152 10*3/uL (ref 150–400)
RBC: 2.85 MIL/uL — AB (ref 3.87–5.11)
RDW: 16.5 % — ABNORMAL HIGH (ref 11.5–15.5)
WBC: 8.1 10*3/uL (ref 4.0–10.5)

## 2017-01-23 LAB — GLUCOSE, CAPILLARY
GLUCOSE-CAPILLARY: 125 mg/dL — AB (ref 65–99)
GLUCOSE-CAPILLARY: 130 mg/dL — AB (ref 65–99)
Glucose-Capillary: 126 mg/dL — ABNORMAL HIGH (ref 65–99)
Glucose-Capillary: 145 mg/dL — ABNORMAL HIGH (ref 65–99)

## 2017-01-23 LAB — BASIC METABOLIC PANEL
ANION GAP: 7 (ref 5–15)
BUN: 40 mg/dL — ABNORMAL HIGH (ref 6–20)
CO2: 26 mmol/L (ref 22–32)
Calcium: 7.1 mg/dL — ABNORMAL LOW (ref 8.9–10.3)
Chloride: 104 mmol/L (ref 101–111)
Creatinine, Ser: 4.01 mg/dL — ABNORMAL HIGH (ref 0.44–1.00)
GFR calc Af Amer: 12 mL/min — ABNORMAL LOW (ref >60)
GFR, EST NON AFRICAN AMERICAN: 11 mL/min — AB (ref >60)
GLUCOSE: 153 mg/dL — AB (ref 65–99)
POTASSIUM: 3.5 mmol/L (ref 3.5–5.1)
Sodium: 137 mmol/L (ref 135–145)

## 2017-01-23 LAB — TSH: TSH: 1.359 u[IU]/mL (ref 0.350–4.500)

## 2017-01-23 LAB — HEMOGLOBIN A1C
Hgb A1c MFr Bld: 5.1 % (ref 4.8–5.6)
MEAN PLASMA GLUCOSE: 100 mg/dL

## 2017-01-23 LAB — MRSA PCR SCREENING: MRSA by PCR: NEGATIVE

## 2017-01-23 MED ORDER — SODIUM CHLORIDE 0.9% FLUSH: 10.0000 mL | INTRAVENOUS | Status: DC | PRN

## 2017-01-23 MED ORDER — SODIUM CHLORIDE 0.9% FLUSH
10.0000 mL | Freq: Two times a day (BID) | INTRAVENOUS | Status: DC
  Administered 2017-01-24 – 2017-01-27 (×7): 10 mL

## 2017-01-23 MED ORDER — CHLORHEXIDINE GLUCONATE CLOTH 2 % EX PADS
6.0000 | MEDICATED_PAD | Freq: Every day | CUTANEOUS | Status: DC
  Administered 2017-01-24 – 2017-01-27 (×4): 6 via TOPICAL

## 2017-01-23 NOTE — Progress Notes (Signed)
Subjective: Interval History: Patient claims she is feeling better.  Denies any difficulty breathing.  Objective: Vital signs in last 24 hours: Temp:  [97.9 F (36.6 C)-99.3 F (37.4 C)] 97.9 F (36.6 C) (11/25 0739) Pulse Rate:  [65-153] 90 (11/25 0739) Resp:  [13-26] 14 (11/25 0739) BP: (74-185)/(41-124) 109/41 (11/25 0700) SpO2:  [91 %-100 %] 97 % (11/25 0739) FiO2 (%):  [3 %] 3 % (11/24 1019) Weight change:   Intake/Output from previous day: 11/24 0701 - 11/25 0700 In: 370 [P.O.:360; I.V.:10] Out: -  Intake/Output this shift: No intake/output data recorded.  Patient on dialysis Chest is clear to auscultation Heart exam irevealed regular rate and rhythm no murmur Abdomen: Positive bowel sounds Extremities no edema   Lab Results: Recent Labs    01/22/17 0443 01/23/17 0444  WBC 10.6* 8.1  HGB 10.0* 8.2*  HCT 31.2* 26.3*  PLT 179 152   BMET:  Recent Labs    01/22/17 0443 01/23/17 0444  NA 137 137  K 3.6 3.5  CL 99* 104  CO2 28 26  GLUCOSE 162* 153*  BUN 26* 40*  CREATININE 2.81* 4.01*  CALCIUM 7.7* 7.1*   No results for input(s): PTH in the last 72 hours. Iron Studies: No results for input(s): IRON, TIBC, TRANSFERRIN, FERRITIN in the last 72 hours.  Studies/Results: US Carotid Bilateral  Result Date: 01/22/2017 CLINICAL DATA:  CVA. 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 Status post left carotid endarterectomy in 2014. EXAM: BILATERAL CAROTID DUPLEX ULTRASOUND TECHNIQUE: Pearline Cables scale imaging, color Doppler and duplex ultrasound were performed of bilateral carotid and vertebral arteries in the neck. COMPARISON:  Brain MRI 01/21/2017. Carotid duplex report from 06/11/2014 FINDINGS: Criteria: Quantification of carotid stenosis is based on velocity parameters that correlate the residual internal carotid diameter with NASCET-based stenosis levels, using the diameter of the distal internal carotid lumen as the  denominator for stenosis measurement. The following velocity measurements were obtained: RIGHT ICA:  Occluded CCA:  94 cm/sec ECA:  234 cm/sec LEFT ICA:  165 cm/sec CCA:  948 cm/sec SYSTOLIC ICA/CCA RATIO:  1.2 DIASTOLIC ICA/CCA RATIO:  1.8 ECA:  176 cm/sec RIGHT CAROTID ARTERY: Heterogeneous plaque in the proximal common carotid artery. Irregular echogenic plaque at the right carotid bulb. Occlusion of the internal carotid artery near the origin. Right external carotid artery is patent. RIGHT VERTEBRAL ARTERY: Antegrade flow and normal waveform in the right vertebral artery. LEFT CAROTID ARTERY: Small amount of plaque in left common carotid artery. Left carotid bulb is patulous and compatible with previous endarterectomy. External carotid artery is patent with normal waveform. Small amount of plaque in the left internal carotid artery. Mild tortuosity in the distal internal carotid artery with a slightly elevated peak systolic velocity measuring up to 165 cm/sec. LEFT VERTEBRAL ARTERY: Retrograde flow in the left vertebral artery. This finding appears chronic based on the previous ultrasound report. IMPRESSION: Chronic occlusion of the right internal carotid artery. Surgical changes compatible with a left carotid endarterectomy. Mild plaque and tortuosity in the left internal carotid artery. Estimated degree of stenosis in the left internal carotid artery is less than 50%. Retrograde flow in the left vertebral artery. 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 This appears to be a chronic finding. Antegrade flow in the right vertebral artery. Electronically Signed   By: Markus Daft M.D.   On: 01/22/2017 08:59   Dg Chest Port 1 View  Result Date: 01/22/2017 CLINICAL DATA:  66 year old female with history of shortness of breath. EXAM: PORTABLE CHEST 1 VIEW COMPARISON:  Chest x-ray 01/20/2017. FINDINGS: There is a right-sided internal jugular central venous  catheter with tip terminating in the superior cavoatrial junction. Left-sided internal jugular PermCath with tip terminating in the superior cavoatrial junction. Film is underpenetrated limiting the diagnostic sensitivity and specificity of the examination. With this limitation in mind, there is potential retrocardiac opacity in the left lower lobe which could reflect atelectasis and/or airspace consolidation. No definite pleural effusions. There is cephalization of the pulmonary vasculature and slight indistinctness of the interstitial markings suggestive of mild pulmonary edema. Mild cardiomegaly. The patient is rotated to the left on today's exam, resulting in distortion of the mediastinal contours and reduced diagnostic sensitivity and specificity for mediastinal pathology. Aortic atherosclerosis. IMPRESSION: 1. Support apparatus, as above. 2. Overall, the appearance the chest suggests mild congestive heart failure, as discussed above. 3. Possible area of atelectasis and/or consolidation in the left lower lobe. 4. Aortic atherosclerosis. Electronically Signed   By: Vinnie Langton M.D.   On: 01/22/2017 18:15    I have reviewed the patient's current medications.  Assessment/Plan: Problem #1 renal failure: She is status post hemodialysis on Friday.  Presently she denies any nausea or vomiting. 2] hyperkalemia: her potassium is normal  3] history of CHF: No sign of fluid overload.  She denies any difficulty breathing. 4] bone and mineral disorder: Her calcium and phosphorus is in the range.  Patient is not on a binder. 5] anemia:.  She is status post blood transfusion.  Her hemoglobin has declined today. 6] hypertension: Her blood pressure is reasonably controlled 7] CVA: MRI showed possible small acute lacunar infarct of the right caudate. 8] history of atrial fibrillation: Patient presently in ICU.  Her heart rate seems to be controlled irregular rate and rhythm. Plan:1] will make arrangement for  patient to get dialysis tomorrow for 4 hours. 2]CBC renal panel in am     LOS: 17 days   Kinda Pottle S 01/23/2017,8:51 AM

## 2017-01-23 NOTE — Progress Notes (Signed)
Physical Therapy Treatment Patient Details Name: Kelli [name] MRN: [id number] DOB: 1950/09/27 Today's Date: 01/23/2017    History of Present Illness 66 yo female with onset of SOB and weakness was admitted, has notable L hemi presentation with chronic O2 use with low CO2, low functional level at baseline.  PMHx:  stroke, CKD, CHF, DM, HTN, L humeral fracture, carotid occlusion, a-fib, low CO2.    PT Comments    Pt received lying in bed with husband at bedside and was agreeable to PT treatment. Began with supine LE exercises; pt able to perform independently with RLE and required min to no A for LLE throughout. Pt mod to max A for all bed mobility, max A for sit <> stand with RW, and mod A for standing balance once in full upright position. When attempting to perform sit > stand, pt verbalized a few times that she felt unsafe because of the bed; PT deflated the bed to help pt feel more secure and ensured her that she was safe and pt verbalizing that this helped. Pt max A for transfer due to increased LLE and LUE weakness; she required cues to stand up tall and straighten her knees once in full upright position. Due to weakness and difficulty maintaining static standing balance, pt was returned to bed. Continue to recommend SNF upon d/c due to significant deficits in mobility and strength. Will continue to follow acutely.     Follow Up Recommendations  SNF     Equipment Recommendations  None recommended by PT    Recommendations for Other Services OT consult     Precautions / Restrictions Precautions Precautions: Fall Restrictions Weight Bearing Restrictions: No    Mobility  Bed Mobility                  Transfers                    Ambulation/Gait                 Stairs            Wheelchair Mobility    Modified Rankin (Stroke Patients Only)       Balance Overall balance assessment: Needs assistance Sitting-balance support: Feet  supported;Single extremity supported Sitting balance-Leahy Scale: Fair Sitting balance - Comments: able to sit EOB with RUE support and bil feet supported with good control, min cues to sit up straight   Standing balance support: Bilateral upper extremity supported Standing balance-Leahy Scale: Poor Standing balance comment: increased trunk flexion in standing, and required cues to stand up tall                             Cognition Arousal/Alertness: Awake/alert Behavior During Therapy: WFL for tasks assessed/performed Overall Cognitive Status: Within Functional Limits for tasks assessed                                        Exercises General Exercises - Lower Extremity Ankle Circles/Pumps: Both;10 reps;Supine Gluteal Sets: Both;10 reps;Supine Short Arc Quad: Both;10 reps;Supine Heel Slides: Both;10 reps;Supine;Limitations Heel Slides Limitations: min A for LLE Hip ABduction/ADduction: Both;10 reps;Supine;Limitations Hip Abduction/Adduction Limitations: min A for LLE Other Exercises Other Exercises: sit <> stand x 2 with RW; max A for transfer, mod A for balance once standing and cues to stand up tall  General Comments        Pertinent Vitals/Pain Pain Assessment: No/denies pain    Home Living                      Prior Function            PT Goals (current goals can now be found in the care plan section) Acute Rehab PT Goals Patient Stated Goal: to get straight home PT Goal Formulation: With patient/family Time For Goal Achievement: 01/23/17 Potential to Achieve Goals: Good    Frequency    Min 3X/week      PT Plan      Co-evaluation              AM-PAC PT "6 Clicks" Daily Activity  Outcome Measure  Difficulty turning over in bed (including adjusting bedclothes, sheets and blankets)?: Unable Difficulty moving from lying on back to sitting on the side of the bed? : Unable Difficulty sitting down on and  standing up from a chair with arms (e.g., wheelchair, bedside commode, etc,.)?: Unable Help needed moving to and from a bed to chair (including a wheelchair)?: Total Help needed walking in hospital room?: Total Help needed climbing 3-5 steps with a railing? : Total 6 Click Score: 6    End of Session Equipment Utilized During Treatment: Oxygen;Gait belt Activity Tolerance: Patient limited by fatigue;Patient tolerated treatment well Patient left: with call bell/phone within reach;with family/visitor present;in chair Nurse Communication: Mobility status PT Visit Diagnosis: Unsteadiness on feet (R26.81);Muscle weakness (generalized) (M62.81);Difficulty in walking, not elsewhere classified (R26.2);Hemiplegia and hemiparesis Hemiplegia - Right/Left: Left Hemiplegia - dominant/non-dominant: Non-dominant Hemiplegia - caused by: Unspecified     Time: [id number] PT Time Calculation (min) (ACUTE ONLY): 34 min  Charges:  $Therapeutic Exercise: 8-22 mins $Therapeutic Activity: 8-22 mins                    G Codes:         Geraldine Solar PT, DPT

## 2017-01-23 NOTE — Progress Notes (Signed)
Cardizem gtt not started as ordered. Pt varies between a controlled a.fibb of 80-90 and NSR with pac's. Dr. Olevia Bowens updated and no new orders received.

## 2017-01-23 NOTE — Progress Notes (Signed)
PROGRESS NOTE                                                                                                                                                                                                             Patient Demographics:    Kelli [name], is a 66 y.o. female, DOB - Aug 03, 1950, TWS:[id number]  Admit date - 01/06/2017   Admitting Physician Karmen Bongo, MD  Outpatient Primary MD for the patient is [name], [name], MD  LOS - 17  Outpatient Specialists: none  Chief Complaint  Patient presents with  . Shortness of Breath  . Dysuria  . Chest Pain       Brief Narrative   66 year old female with a history of stroke with residual left hemiparesis, CKD stage III, essential hypertension, diabetes mellitus, peripheral vascular disease presentedwith 1 day history of shortness of breath, worsening leg edema, and chest discomfort. The patient also complains of a cough with clear sputum. She had subjective fevers and chills, but denied any headache, neck pain.T  -In ER: Hb 7.6. Chest x-ray showed pulmonary edema with BNP 966. The patientwas started on IV Lasix but remained fluid overloaded and her renal function continued to worsen. Repeat chest x-ray revealed pulmonary edema. Nephrology was consulted to assist with management and possible initiation of dialysis. Dr. Constance Haw placed VasCath on 01/11/17.  Hospital course prolonged with melena needing GI workup and rapid afib.       Subjective:   Patient went into rapid A. Fib yesterday afternoon and transfer to stepdown unit To start Cardizem drip. Metoprolol dose was increased but patient did not require Cardizem drip since heart rate started toRemain stable.   Assessment  & Plan :    Acute on chronic renal failure/acute on chronic diastolic CHF.  now ESRD. ,HD initiated 01/11/17,Tunnelled cath placed 01/14/17. Renal following. Has been CLIPed in  [place] for HD on M,W,F ( first session on 11/26) Patient is a poor candidate for long-term dialysis in her multiple co morbidities.   Acute blood loss anemia.  Hx of duodenal ulcers on EGD in 2016. She was taken off asa and plavix at some point after that due to GI bleeding .  Received 5 u PRBC this admission. EGD this admission shows gastritis and duodenitis. GI recommends no aspirin and may use plavix if needed. Could not perform  colonoscopy due to poor prep and will unlikely be able to done in future. Noted for  drop in H&H today. I will hold off on starting Plavix.  Hx of Stroke (cryptogenic) with left hemiparesis and new acute stroke  has a loop recorder in place.  -Not on ASA at home due to hx fo GI bleed. Will start her on Plavix his H&H stable. -MRI showed Possible small acute lacunar infarct in the right caudate nucleus. No associated hemorrhage or mass effect. Echo: LVEF-55%. No defect or patent foramen ovale was identified.  -patient denies acute or new focal neuro symptoms. She is not a good candidate for dual antiplatelets or anticoagulation therapy due to active melena. continue statin . Will place no plavix if h&h stale in am. ( per GI ok to use plavix if needed and avoid ASA due to severe gastritis and duodenitis). 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 -A1C 5.7. -Her CHADS2vasc sore is 7 with new Afib but not admitted for anticoagulation due to active  melena.  SVT/new onset A. Fib with RVR Patient went into rapid A. Fib on 11/24. Heart rate better controlled after increasing metoprolol dose. Did not require Cardizem drip. TSH normal. Not a candidate for endocarditis and due to melena. (Discussed in detail with patient's husband weighing risk versus benefit of starting anticoagulation. Husband agrees with her significantly IV disc of recurrent stroke without being on anticoagulation.) she has a loop recorded which was placed after  a CVA in 2/15. minimize use of albuterol.     Acute respiratory failure with hypoxia (HCC) Secondary tofluid overload and COPD.  -improved. continue HD now for volume management.  completed steroids taper.continue PRN nebulizer. ( minimize  Albuterol given SVTs)  Leukocytosis.  Likely due to steroids. no signs of infection  Metabolic acidosis due to renal failure.  Cont HD  HTN (hypertension). Low BP post EGD. Stable now. hydralazine. toprol switched to metoprol bid.  Elevated troponin  most likely associated with CKD. no chest pain symptoms    Diabetes mellitus type 2 in obese.  continue SSI.  A1C of  5.7   Mixed hyperlipidemia.  continue statin   Severe fatigue/ depression with flat affect.  Needs SNF     Code Status: full. Overall long-term prognosis is guarded. Discussed goals of care and CODE STATUS with the patient's husband in detail. He understands that patient is likely to deteriorate over time and has several significant comorbidities. He would like patient to have full scope of treatment for now (including need for ventilator or cardiac resuscitation For short-term)  Family Communication: husband at bedside Disposition Plan: SNF once symptoms better   Consultants:  nephro  Palliative care  IR    Procedures:  HD cath by surgery  Tunneled dialysis cath per IR  MRI brain  Carotid Doppler  2-D echo    Lab Results  Component Value Date   PLT 152 01/23/2017    Antibiotics  :    Anti-infectives (From admission, onward)   Start     Dose/Rate Route Frequency Ordered Stop   01/14/17 1415  ceFAZolin (ANCEF) IVPB 2g/100 mL premix     2 g 200 mL/hr over 30 Minutes Intravenous To Radiology 01/14/17 1413 01/14/17 1455        Objective:   Vitals:   01/23/17 0800 01/23/17 0900 01/23/17 1000 01/23/17 1100  BP: (!) 116/53 (!) 103/51 (!) 116/59 (!) 127/57  Pulse: 84 82 83  93  Resp: 16 15 13 13   Temp:      TempSrc:        SpO2: 99% 97% 97% 99%  Weight:      Height:        Wt Readings from Last 3 Encounters:  01/21/17 82 kg (180 lb 12.4 oz)  12/28/16 83.5 kg (184 lb)  11/18/16 83.5 kg (184 lb)     Intake/Output Summary (Last 24 hours) at 01/23/2017 1310 Last data filed at 01/22/2017 1654 Gross per 24 hour  Intake 10 ml  Output -  Net 10 ml     Physical Exam Gen.: Appears fatigued, more alert and oriented HEENT: Pallor present, moist, supple neck, Right IJ Chest: Clear to auscultation bilaterally, no added sounds CVS: S1 and S2 irregular, no murmurs rub or gallop GI: Soft, nondistended, nontender, bowel sounds present Musculoskeletal: Warm, no edema   Data Review:    CBC Recent Labs  Lab 01/20/17 0457 01/21/17 0451 01/21/17 1552 01/21/17 2224 01/22/17 0443 01/23/17 0444  WBC 11.2* 8.0 9.8  --  10.6* 8.1  HGB 7.3* 6.6* 9.8* 9.3* 10.0* 8.2*  HCT 22.9* 21.0* 30.0* 28.4* 31.2* 26.3*  PLT 195 174 168  --  179 152  MCV 88.4 90.9 88.5  --  90.4 92.3  MCH 28.2 28.6 28.9  --  29.0 28.8  MCHC 31.9 31.4 32.7  --  32.1 31.2  RDW 15.7* 16.1* 15.5  --  16.5* 16.5*    Chemistries  Recent Labs  Lab 01/18/17 0738 01/19/17 0412 01/21/17 0451 01/22/17 0443 01/23/17 0444  NA 135 133* 133* 137 137  K 3.8 4.3 3.8 3.6 3.5  CL 98* 97* 97* 99* 104  CO2 30 28 28 28 26   GLUCOSE 128* 148* 122* 162* 153*  BUN 30* 70* 39* 26* 40*  CREATININE 2.79* 3.44* 3.45* 2.81* 4.01*  CALCIUM 6.8* 6.6* 6.8* 7.7* 7.1*   ------------------------------------------------------------------------------------------------------------------ Recent Labs    01/22/17 0443  CHOL 103  HDL 37*  LDLCALC 45  TRIG 106  CHOLHDL 2.8    Lab Results  Component Value Date   HGBA1C 5.1 01/22/2017   ------------------------------------------------------------------------------------------------------------------ Recent Labs    01/23/17 0444  TSH 1.359    ------------------------------------------------------------------------------------------------------------------ No results for input(s): VITAMINB12, FOLATE, FERRITIN, TIBC, IRON, RETICCTPCT in the last 72 hours.  Coagulation profile No results for input(s): INR, PROTIME in the last 168 hours.  No results for input(s): DDIMER in the last 72 hours.  Cardiac Enzymes No results for input(s): CKMB, TROPONINI, MYOGLOBIN in the last 168 hours.  Invalid input(s): CK ------------------------------------------------------------------------------------------------------------------    Component Value Date/Time   BNP 966.0 (H) 01/06/2017 1655    Inpatient Medications  Scheduled Meds: . atorvastatin  40 mg Oral q1800  . budesonide (PULMICORT) nebulizer solution  0.5 mg Nebulization BID  . calcitRIOL  0.25 mcg Oral Daily  . epoetin (EPOGEN/PROCRIT) injection  8,000 Units Intravenous Q M,W,F-HD  . feeding supplement  1 Container Oral TID BM  . hydrALAZINE  50 mg Oral TID  . insulin aspart  0-15 Units Subcutaneous TID WC  . insulin aspart  0-5 Units Subcutaneous QHS  . ipratropium-albuterol  3 mL Nebulization Q6H  . loratadine  10 mg Oral Daily  . mouth rinse  15 mL Mouth Rinse BID  . metoprolol tartrate  50 mg Oral BID  . pantoprazole  40 mg Oral BID AC  . sodium chloride flush  10-40 mL Intracatheter Q12H   Continuous Infusions: . sodium chloride    .  sodium chloride    . sodium chloride    . sodium chloride    . sodium chloride    . sodium chloride    . sodium chloride    . diltiazem (CARDIZEM) infusion     PRN Meds:.sodium chloride, sodium chloride, sodium chloride, sodium chloride, sodium chloride, sodium chloride, acetaminophen, alteplase, alum & mag hydroxide-simeth, lidocaine (PF), lidocaine-prilocaine, metoprolol tartrate, ondansetron (ZOFRAN) IV, ondansetron (ZOFRAN) IV, pentafluoroprop-tetrafluoroeth, sodium chloride flush  Micro Results Recent Results (from the  past 240 hour(s))  MRSA PCR Screening     Status: None   Collection Time: 01/22/17  7:21 PM  Result Value Ref Range Status   MRSA by PCR NEGATIVE NEGATIVE Final    Comment:        The GeneXpert MRSA Assay (FDA approved for NASAL specimens only), is one component of a comprehensive MRSA colonization surveillance program. It is not intended to diagnose MRSA infection nor to guide or monitor treatment for MRSA infections.     Radiology Reports Ct Abdomen Pelvis Wo Contrast  Result Date: 01/07/2017 CLINICAL DATA:  Abdominal pain. Decreased hemoglobin. Clinical suspicion for retroperitoneal hemorrhage. EXAM: CT ABDOMEN AND PELVIS WITHOUT CONTRAST TECHNIQUE: Multidetector CT imaging of the abdomen and pelvis was performed following the standard protocol without IV contrast. COMPARISON:  None. FINDINGS: Lower chest: Bibasilar heterogeneous airspace disease and small bilateral pleural effusions. Hepatobiliary: No mass visualized on this unenhanced exam. Gallbladder is unremarkable. Pancreas: No mass or inflammatory process visualized on this unenhanced exam. Spleen:  Within normal limits in size. Adrenals/Urinary tract: Bilateral renal parenchymal atrophy, left side greater than right, consistent with history of chronic kidney disease. Renal vascular calcification noted. Several tiny calculi are seen within the left renal collecting system and proximal left ureter, without hydronephrosis. Unremarkable unopacified urinary bladder. Stomach/Bowel: No evidence of obstruction, inflammatory process, or abnormal fluid collections. Normal appendix visualized. Mild left-sided colonic diverticulosis seen, without evidence of diverticulitis. Vascular/Lymphatic: No pathologically enlarged lymph nodes identified. Infrarenal abdominal aortic aneurysm measuring 3.4 x 3.3 cm. Reproductive: Prior hysterectomy noted. Adnexal regions are unremarkable in appearance. Other: No evidence of retroperitoneal hemorrhage or  hemoperitoneum. Mild edema in subcutaneous tissues. Musculoskeletal:  No suspicious bone lesions identified. IMPRESSION: No evidence of retroperitoneal hemorrhage or hemoperitoneum. Several tiny calculi in left renal collecting system and proximal left ureter, without hydronephrosis. Bilateral renal parenchymal atrophy, left side greater than right. Colonic diverticulosis, without radiographic evidence of diverticulitis. Small bilateral pleural effusions and bilateral lower lobe airspace disease. 3.4 cm infrarenal abdominal aortic aneurysm. Recommend followup by ultrasound in 3 years. This recommendation follows ACR consensus guidelines: White Paper of the ACR Incidental Findings Committee II on Vascular Findings. Natasha Mead Coll Radiol 2013; 10:789-794 Electronically Signed   By: Earle Gell M.D.   On: 01/07/2017 10:22   Dg Chest 2 View  Result Date: 01/06/2017 CLINICAL DATA:  Trouble breathing x1 day with tightness and congestion in chest. Weakness. Hx of diabetes, HTN-controlled with medication, and low hemoglobin. 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 EXAM: CHEST  2 VIEW COMPARISON:  10/19/2016 FINDINGS: The heart is enlarged. There is pulmonary vascular congestion. Prominent interstitial markings are consistent with mild edema. Trace bilateral pleural effusions are also suspected. There is mild bibasilar atelectasis. IMPRESSION: Cardiomegaly and mild pulmonary edema. Electronically Signed   By: Nolon Nations M.D.   On: 01/06/2017 16:56   Ct Head Wo Contrast  Result Date: 01/19/2017 CLINICAL DATA:  Unexplained altered level of consciousness. EXAM: CT HEAD WITHOUT CONTRAST TECHNIQUE: Contiguous axial  images were obtained from the base of the skull through the vertex without intravenous contrast. COMPARISON:  Brain MRI 09/17/2013 FINDINGS: Brain: Confluent low-density in the cerebral white matter consistent with chronic small vessel ischemia. Remote moderate to large right PCA distribution  affecting the inferior temporal and occipital lobes. Small remote high right posterior frontal and left parietal cortex infarcts. Patchy indistinct bilateral deep gray nuclei consistent with chronic small vessel ischemic injury. Remote right pontine infarct. Remote small vessel left cerebellar infarct. The left thalamus is asymmetrically low-density compared to the right common mildly expanded. This could reflect a recent infarct. No hemorrhage, hydrocephalus, or shift. Vascular: Atherosclerotic calcification. Small right ICA at the skullbase. There was right ICA occlusion on prior exam. No hyperdense vessel Skull: No acute or aggressive finding. Sinuses/Orbits: No acute finding IMPRESSION: 1. Compared to 2015 there is patchy low-density in the left thalamus with mild expansion. This may reflect a recent infarct if there is associated deficit. MRI could characterize if clinically appropriate. 2. Advanced chronic ischemic injury as noted above. 3. 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 Electronically Signed   By: Monte Fantasia M.D.   On: 01/19/2017 15:50   Mr Brain Wo Contrast  Result Date: 01/21/2017 CLINICAL DATA:  66 year old female with altered mental status for 2 days. Abnormal density in the left thalamus new compared to 2015 on recent head CT. EXAM: MRI HEAD WITHOUT CONTRAST TECHNIQUE: Multiplanar, multiecho pulse sequences of the brain and surrounding structures were obtained without intravenous contrast. COMPARISON:  Head CT without contrast 01/19/2017. Brain MRI 09/17/2013. FINDINGS: Brain: The only possible focus of acutely restricted diffusion is a small area in the right caudate nucleus on series 3, image 87. Mild T2 and FLAIR hyperintensity with no hemorrhage or mass effect. Severe chronic ischemic disease. Cortical encephalomalacia in the right greater than left MCA and right PCA territories with advanced Wallerian degeneration in the right brainstem. Progressed since  2015 and confluent abnormal signal in the left deep gray matter nuclei, but mostly facilitated on diffusion indicating subacute to chronic time course. Progressed chronic small vessel ischemia in the left cerebellum since 2015. Progressed patchy and confluent left MCA territory white matter T2 and FLAIR hyperintensity. Scattered post ischemic hemosiderin and occasional other chronic micro hemorrhages. No midline shift, mass effect, evidence of mass lesion, ventriculomegaly, extra-axial collection or acute intracranial hemorrhage. Cervicomedullary junction and pituitary are within normal limits. 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 Other Major intracranial vascular flow voids are stable. The distal right vertebral artery is dominant. Skull and upper cervical spine: Negative. Visualized bone marrow signal is within normal limits. Sinuses/Orbits: Stable orbits soft tissues. Mild paranasal sinus mucosal thickening has not significantly changed. Other: Mild left mastoid effusion is new but significance is doubtful. Small volume retained secretions in the left nasopharynx. Right mastoids are clear. Scalp and face soft tissues appear negative. IMPRESSION: 1. Possible small acute lacunar infarct in the right caudate nucleus. No associated hemorrhage or mass effect. 2. No other acute ischemia identified, but very severe chronic ischemic disease. Progression in the left deep gray matter nuclei and the left cerebellum since 2015. Right greater than left chronic MCA and right PCA territory encephalomalacia with advanced Wallerian degeneration in the right brainstem. 3. Chronic right ICA occlusion. Electronically Signed   By: Genevie Ann M.D.   On: 01/21/2017 07:56   US Carotid Bilateral  Result Date: 01/22/2017 CLINICAL DATA:  CVA. History of left hemiparesis.  Chronic occlusion of the right internal carotid artery. Status post left carotid endarterectomy in 2014. EXAM: BILATERAL CAROTID  DUPLEX ULTRASOUND TECHNIQUE: Pearline Cables scale imaging, color Doppler and duplex ultrasound were performed of bilateral carotid and vertebral arteries in the neck. COMPARISON:  Brain MRI 01/21/2017. Carotid duplex report from 06/11/2014 FINDINGS: Criteria: Quantification of carotid stenosis is based on velocity parameters that correlate the residual internal carotid diameter with NASCET-based stenosis levels, using the diameter of the distal internal carotid lumen as the denominator for stenosis measurement. The following velocity measurements were obtained: RIGHT ICA:  Occluded CCA:  94 cm/sec ECA:  234 cm/sec LEFT ICA:  165 cm/sec CCA:  326 cm/sec SYSTOLIC ICA/CCA RATIO:  1.2 DIASTOLIC ICA/CCA RATIO:  1.8 ECA:  176 cm/sec RIGHT CAROTID ARTERY: Heterogeneous plaque in the proximal common carotid artery. Irregular echogenic plaque at the right carotid bulb. Occlusion of the internal carotid artery near the origin. Right external carotid artery is patent. RIGHT VERTEBRAL ARTERY: Antegrade flow and normal waveform in the right vertebral artery. LEFT CAROTID ARTERY: Small amount of plaque in left common carotid artery. Left carotid bulb is patulous and compatible with previous endarterectomy. External carotid artery is patent with normal waveform. Small amount of plaque in the left internal carotid artery. Mild tortuosity in the distal internal carotid artery with a slightly elevated peak systolic velocity measuring up to 165 cm/sec. LEFT VERTEBRAL ARTERY: Retrograde flow in the left vertebral artery. This finding appears chronic based on the previous ultrasound report. IMPRESSION: Chronic occlusion of the right internal carotid artery. Surgical changes compatible with a left carotid endarterectomy. Mild plaque and tortuosity in the left internal carotid artery. Estimated degree of stenosis in the left internal carotid artery is less than 50%. Retrograde flow in the left vertebral artery. 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 This appears to be a chronic finding. Antegrade flow in the right vertebral artery. Electronically Signed   By: Markus Daft M.D.   On: 01/22/2017 08:59   US Venous Img Lower Bilateral  Result Date: 01/19/2017 CLINICAL DATA:  Bilateral lower extremity swelling. EXAM: BILATERAL LOWER EXTREMITY VENOUS DOPPLER ULTRASOUND TECHNIQUE: Gray-scale sonography with graded compression, as well as color Doppler and duplex ultrasound were performed to evaluate the lower extremity deep venous systems from the level of the common femoral vein and including the common femoral, femoral, profunda femoral, popliteal and calf veins including the posterior tibial, peroneal and gastrocnemius veins when visible. The superficial great saphenous vein was also interrogated. Spectral Doppler was utilized to evaluate flow at rest and with distal augmentation maneuvers in the common femoral, femoral and popliteal veins. COMPARISON:  10/13/2016 FINDINGS: RIGHT LOWER EXTREMITY Common Femoral Vein: No evidence of thrombus. Normal compressibility, respiratory phasicity and response to augmentation. Saphenofemoral Junction: No evidence of thrombus. Normal compressibility and flow on color Doppler imaging. Profunda Femoral Vein: No evidence of thrombus. Normal compressibility and flow on color Doppler imaging. Femoral Vein: No evidence of thrombus. Normal compressibility, respiratory phasicity and response to augmentation. Popliteal Vein: No evidence of thrombus. Normal compressibility, respiratory phasicity and response to augmentation. Calf Veins: No evidence of thrombus. Normal compressibility and flow on color Doppler imaging. Superficial Great Saphenous Vein: No evidence of thrombus. Normal compressibility. Venous Reflux:  None. Other Findings:  None. LEFT LOWER EXTREMITY Common Femoral Vein: No evidence of thrombus. Normal compressibility, respiratory phasicity and response to  augmentation. Saphenofemoral Junction: No evidence of thrombus. Normal compressibility and flow on color Doppler imaging. Profunda Femoral  Vein: No evidence of thrombus. Normal compressibility and flow on color Doppler imaging. Femoral Vein: No evidence of thrombus. Normal compressibility, respiratory phasicity and response to augmentation. Popliteal Vein: No evidence of thrombus. Normal compressibility, respiratory phasicity and response to augmentation. Calf Veins: No evidence of thrombus. Normal compressibility and flow on color Doppler imaging. Superficial Great Saphenous Vein: No evidence of thrombus. Normal compressibility. Venous Reflux:  None. Other Findings:  None. IMPRESSION: No evidence of deep venous thrombosis. Electronically Signed   By: Rolm Baptise M.D.   On: 01/19/2017 15:35   Ir Fluoro Guide Cv Line Left  Result Date: 01/14/2017 INDICATION: End-stage renal disease, in need of durable intravenous access for continuation of dialysis. EXAM: TUNNELED CENTRAL VENOUS HEMODIALYSIS CATHETER PLACEMENT WITH ULTRASOUND AND FLUOROSCOPIC GUIDANCE MEDICATIONS: Ancef 2 gm IV . The antibiotic was given in an appropriate time interval prior to skin puncture. ANESTHESIA/SEDATION: Versed 2 mg IV; Fentanyl 50 mcg IV; Moderate Sedation Time:  16 minutes The patient was continuously monitored during the procedure by the interventional radiology nurse under my direct supervision. FLUOROSCOPY TIME:  Fluoroscopy Time: 36 seconds (5 mGy). COMPLICATIONS: None immediate. PROCEDURE: Informed written consent was obtained from the patient after a discussion of the risks, benefits, and alternatives to treatment. Questions regarding the procedure were encouraged and answered. The left neck and chest were prepped with chlorhexidine in a sterile fashion, and a sterile drape was applied covering the operative field. Maximum barrier sterile technique with sterile gowns and gloves were used for the procedure. A timeout was  performed prior to the initiation of the procedure. After creating a small venotomy incision, a micropuncture kit was utilized to access the internal jugular vein. Real-time ultrasound guidance was utilized for vascular access including the acquisition of a permanent ultrasound image documenting patency of the accessed vessel. The microwire was utilized to measure appropriate catheter length. A stiff Glidewire was advanced to the level of the IVC and the micropuncture sheath was exchanged for a peel-away sheath. A palindrome tunneled hemodialysis catheter measuring 23 cm from tip to cuff was tunneled in a retrograde fashion from the anterior chest wall to the venotomy incision. The catheter was then placed through the peel-away sheath with tips ultimately positioned within the superior aspect of the right atrium. Final catheter positioning was confirmed and documented with a spot radiographic image. The catheter aspirates and flushes normally. The catheter was flushed with appropriate volume heparin dwells. The catheter exit site was secured with a 0-Prolene retention suture. The venotomy incision was closed with an interrupted 4-0 Vicryl, Dermabond and Steri-strips. Dressings were applied. The patient tolerated the procedure well without immediate post procedural complication. IMPRESSION: Successful placement of 23 cm tip to cuff tunneled hemodialysis catheter via the left internal jugular vein with tips terminating within the superior aspect of the right atrium. The catheter is ready for immediate use. Electronically Signed   By: Sandi Mariscal M.D.   On: 01/14/2017 15:28   Ir US Guide Vasc Access Left  Result Date: 01/14/2017 INDICATION: End-stage renal disease, in need of durable intravenous access for continuation of dialysis. EXAM: TUNNELED CENTRAL VENOUS HEMODIALYSIS CATHETER PLACEMENT WITH ULTRASOUND AND FLUOROSCOPIC GUIDANCE MEDICATIONS: Ancef 2 gm IV . The antibiotic was given in an appropriate time  interval prior to skin puncture. ANESTHESIA/SEDATION: Versed 2 mg IV; Fentanyl 50 mcg IV; Moderate Sedation Time:  16 minutes The patient was continuously monitored during the procedure by the interventional radiology nurse under my direct supervision. FLUOROSCOPY TIME:  Fluoroscopy Time: 36  seconds (5 mGy). COMPLICATIONS: None immediate. PROCEDURE: Informed written consent was obtained from the patient after a discussion of the risks, benefits, and alternatives to treatment. Questions regarding the procedure were encouraged and answered. The left neck and chest were prepped with chlorhexidine in a sterile fashion, and a sterile drape was applied covering the operative field. Maximum barrier sterile technique with sterile gowns and gloves were used for the procedure. A timeout was performed prior to the initiation of the procedure. After creating a small venotomy incision, a micropuncture kit was utilized to access the internal jugular vein. Real-time ultrasound guidance was utilized for vascular access including the acquisition of a permanent ultrasound image documenting patency of the accessed vessel. The microwire was utilized to measure appropriate catheter length. A stiff Glidewire was advanced to the level of the IVC and the micropuncture sheath was exchanged for a peel-away sheath. A palindrome tunneled hemodialysis catheter measuring 23 cm from tip to cuff was tunneled in a retrograde fashion from the anterior chest wall to the venotomy incision. The catheter was then placed through the peel-away sheath with tips ultimately positioned within the superior aspect of the right atrium. Final catheter positioning was confirmed and documented with a spot radiographic image. The catheter aspirates and flushes normally. The catheter was flushed with appropriate volume heparin dwells. The catheter exit site was secured with a 0-Prolene retention suture. The venotomy incision was closed with an interrupted 4-0 Vicryl,  Dermabond and Steri-strips. Dressings were applied. The patient tolerated the procedure well without immediate post procedural complication. IMPRESSION: Successful placement of 23 cm tip to cuff tunneled hemodialysis catheter via the left internal jugular vein with tips terminating within the superior aspect of the right atrium. The catheter is ready for immediate use. Electronically Signed   By: Sandi Mariscal M.D.   On: 01/14/2017 15:28   Dg Chest Port 1 View  Result Date: 01/22/2017 CLINICAL DATA:  66 year old female with history of shortness of breath. EXAM: PORTABLE CHEST 1 VIEW COMPARISON:  Chest x-ray 01/20/2017. FINDINGS: There is a right-sided internal jugular central venous catheter with tip terminating in the superior cavoatrial junction. Left-sided internal jugular PermCath with tip terminating in the superior cavoatrial junction. Film is underpenetrated limiting the diagnostic sensitivity and specificity of the examination. With this limitation in mind, there is potential retrocardiac opacity in the left lower lobe which could reflect atelectasis and/or airspace consolidation. No definite pleural effusions. There is cephalization of the pulmonary vasculature and slight indistinctness of the interstitial markings suggestive of mild pulmonary edema. Mild cardiomegaly. The patient is rotated to the left on today's exam, resulting in distortion of the mediastinal contours and reduced diagnostic sensitivity and specificity for mediastinal pathology. Aortic atherosclerosis. IMPRESSION: 1. Support apparatus, as above. 2. Overall, the appearance the chest suggests mild congestive heart failure, as discussed above. 3. Possible area of atelectasis and/or consolidation in the left lower lobe. 4. Aortic atherosclerosis. Electronically Signed   By: Vinnie Langton M.D.   On: 01/22/2017 18:15   Dg Chest Port 1 View  Result Date: 01/20/2017 CLINICAL DATA:  Altered mental status short of breath EXAM: PORTABLE  CHEST 1 VIEW COMPARISON:  01/09/2017 FINDINGS: Interval placement of left jugular dual-lumen catheter with tip in the SVC. Right jugular catheter tip in the SVC unchanged. No pneumothorax Interval improvement in diffuse bilateral airspace disease. Mild vascular congestion remains with left lower lobe atelectasis and small left effusion IMPRESSION: Left jugular dual-lumen catheter in good position. Improvement in bilateral edema. Electronically Signed  By: Franchot Gallo M.D.   On: 01/20/2017 08:41   Dg Chest Port 1 View  Result Date: 01/09/2017 CLINICAL DATA:  CHF EXAM: PORTABLE CHEST 1 VIEW COMPARISON:  01/07/2017 FINDINGS: Severe bilateral airspace disease similar to the prior study. Probable congestive heart failure with edema. Pneumonia not excluded. Bibasilar atelectasis and small left effusion. Right jugular central venous catheter tip in the SVC unchanged IMPRESSION: Diffuse bilateral airspace disease unchanged. Probable heart failure. Electronically Signed   By: Franchot Gallo M.D.   On: 01/09/2017 11:27   Dg Chest Port 1 View  Result Date: 01/07/2017 CLINICAL DATA:  66 year old female central line placement. EXAM: PORTABLE CHEST 1 VIEW COMPARISON:  1016 hours today and earlier. FINDINGS: Portable AP semi upright view at 1432 hours. New right IJ approach central line is in place, tip projects at the level of the right mainstem bronchus corresponding to the lower SVC. No pneumothorax. Stable cardiac size and mediastinal contours. Stable bilateral ventilation since this morning including dense retrocardiac opacity which has progressed since 01/06/2017. Visualized tracheal air column is within normal limits. IMPRESSION: 1. Right IJ central line tip at the lower SVC level. No pneumothorax or adverse features. 2. Stable ventilation since 1016 hours today. Electronically Signed   By: Genevie Ann M.D.   On: 01/07/2017 15:02   Dg Chest Port 1 View  Result Date: 01/07/2017 CLINICAL DATA:  Respiratory  distress. EXAM: PORTABLE CHEST 1 VIEW COMPARISON:  01/06/2017 FINDINGS: Lungs are adequately inflated with worsening patchy multifocal airspace opacification most notable over the left midlung and right upper lobe likely a multifocal infectious process. Possible small amount left pleural fluid. Mild stable cardiomegaly. Remainder of the exam is unchanged. IMPRESSION: Slight worsening bilateral multifocal airspace process most notable over the left midlung and right upper lobe suggesting multifocal infection. Stable cardiomegaly. Electronically Signed   By: Marin Olp M.D.   On: 01/07/2017 10:37    Time Spent in minutes  35   Eston Heslin M.D on 01/23/2017 at 1:10 PM  Between 7am to 7pm - Pager - [phone]  After 7pm go to [email or web address] - password [facility]  [facility] -  Office  [phone]

## 2017-01-24 ENCOUNTER — Encounter (HOSPITAL_COMMUNITY): Payer: Self-pay | Admitting: Gastroenterology

## 2017-01-24 LAB — GLUCOSE, CAPILLARY
GLUCOSE-CAPILLARY: 113 mg/dL — AB (ref 65–99)
GLUCOSE-CAPILLARY: 141 mg/dL — AB (ref 65–99)
Glucose-Capillary: 128 mg/dL — ABNORMAL HIGH (ref 65–99)
Glucose-Capillary: 127 mg/dL — ABNORMAL HIGH (ref 65–99)

## 2017-01-24 LAB — BASIC METABOLIC PANEL WITH GFR
Anion gap: 10 (ref 5–15)
BUN: 60 mg/dL — ABNORMAL HIGH (ref 6–20)
CO2: 25 mmol/L (ref 22–32)
Calcium: 7 mg/dL — ABNORMAL LOW (ref 8.9–10.3)
Chloride: 102 mmol/L (ref 101–111)
Creatinine, Ser: 5.22 mg/dL — ABNORMAL HIGH (ref 0.44–1.00)
GFR calc Af Amer: 9 mL/min — ABNORMAL LOW
GFR calc non Af Amer: 8 mL/min — ABNORMAL LOW
Glucose, Bld: 149 mg/dL — ABNORMAL HIGH (ref 65–99)
Potassium: 4 mmol/L (ref 3.5–5.1)
Sodium: 137 mmol/L (ref 135–145)

## 2017-01-24 LAB — CBC
HCT: 28.4 % — ABNORMAL LOW (ref 36.0–46.0)
Hemoglobin: 8.9 g/dL — ABNORMAL LOW (ref 12.0–15.0)
MCH: 29.2 pg (ref 26.0–34.0)
MCHC: 31.3 g/dL (ref 30.0–36.0)
MCV: 93.1 fL (ref 78.0–100.0)
Platelets: 149 K/uL — ABNORMAL LOW (ref 150–400)
RBC: 3.05 MIL/uL — ABNORMAL LOW (ref 3.87–5.11)
RDW: 16.8 % — ABNORMAL HIGH (ref 11.5–15.5)
WBC: 8 K/uL (ref 4.0–10.5)

## 2017-01-24 MED ORDER — EPOETIN ALFA 10000 UNIT/ML IJ SOLN
10000.0000 [IU] | INTRAMUSCULAR | Status: DC
  Filled 2017-01-24 (×3): qty 1

## 2017-01-24 MED ORDER — GUAIFENESIN-DM 100-10 MG/5ML PO SYRP
5.0000 mL | ORAL_SOLUTION | ORAL | Status: DC | PRN
  Administered 2017-01-24 – 2017-01-26 (×5): 5 mL via ORAL
  Filled 2017-01-24 (×5): qty 5

## 2017-01-24 MED ORDER — HEPARIN SODIUM (PORCINE) 1000 UNIT/ML DIALYSIS
1000.0000 [IU] | INTRAMUSCULAR | Status: DC | PRN
  Filled 2017-01-24: qty 1

## 2017-01-24 MED ORDER — SODIUM CHLORIDE 0.9 % IV SOLN: 100.0000 mL | INTRAVENOUS | Status: DC | PRN

## 2017-01-24 MED ORDER — STERILE WATER FOR INJECTION IJ SOLN
INTRAMUSCULAR | Status: AC
  Administered 2017-01-24: 17:00:00
  Filled 2017-01-24: qty 10

## 2017-01-24 MED ORDER — ALTEPLASE 2 MG IJ SOLR
INTRAMUSCULAR | Status: AC
  Administered 2017-01-24: 2 mg
  Filled 2017-01-24: qty 4

## 2017-01-24 MED FILL — Ondansetron HCl Inj 4 MG/2ML (2 MG/ML): INTRAMUSCULAR | Qty: 2 | Status: AC

## 2017-01-24 NOTE — Progress Notes (Addendum)
PROGRESS NOTE                                                                                                                                                                                                             Patient Demographics:    Kelli [name], is a 66 y.o. female, DOB - Jul 27, 1950, QRF:[id number]  Admit date - 01/06/2017   Admitting Physician Karmen Bongo, MD  Outpatient Primary MD for the patient is [name], [name], MD  LOS - 18  Outpatient Specialists: none  Chief Complaint  Patient presents with  . Shortness of Breath  . Dysuria  . Chest Pain       Brief Narrative   66 year old female with a history of stroke with residual left hemiparesis, CKD stage III, essential hypertension, diabetes mellitus, peripheral vascular disease presentedwith 1 day history of shortness of breath, worsening leg edema, and chest discomfort. -In ER: Hb 7.6. Chest x-ray showed pulmonary edema with BNP 966. The patientwas started on IV Lasix but remained fluid overloaded and her renal function continued to worsen. Repeat chest x-ray revealed pulmonary edema. Nephrology was consulted to assist with management and finally ended up starting HD, Dr. Constance Haw placed [place] on 01/11/17. Hospital course prolonged with melena needing GI workup and rapid afib. -Also noted to have a small stroke on MRI this admission    Subjective:   HR improving, off Cardizem gtt now   Assessment  & Plan :    Acute on chronic renal failure/acute on chronic diastolic CHF.  -with progression of CKD, now ESRD. -HD initiated 01/11/17 per Dr.Befekadu -s/p Tunnelled cath placed 01/14/17. -Renal following. Has been CLIPed in [place] for HD on M,W,F starting 11/26 -felt to be a poor candidate for long-term dialysis given multiple co morbidities.  Acute blood loss anemia/Melena.  -Hx of duodenal ulcers on EGD in 2016. She was taken off asa and plavix  at some point after that due to GI bleeding .  -this hospitalization has been complicated by severe anemia and heme positive stools,  -Received a total of 5 units of PRBC this admission, last 2 units given on 11/23 -Seen by gastroenterology, EGD showed severe gastritis and duodenitis GI recommended stopping aspirin and could use Plavix if needed -Colonoscopy attempted but could not be performed due to poor prep -Hemoglobin stable and improving now -Restart  Plavix if stable in 48 hours -In addition has anemia of chronic disease from CKD continue EPo with HD  Hx of Stroke with left hemiparesis and new acute stroke - has a loop recorder in place, with A. fib noted this admission, this is likely the etiology of her previous stroke and current event  -Not on ASA at home due to hx fo GI bleeds, plan to restart Plavix in 48 hours if no further bleeding and hemoglobin stable, see above -MRI showed Possible small acute lacunar infarct in the right caudate nucleus.  - Echo: LVEF-55%. No defect or patent foramen ovale was identified.  -patient denies acute or new focal neuro symptoms. She is not a good candidate for dual antiplatelets or anticoagulation therapy due to active melena. 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 -A1C 5.7. -Her CHADS2vasc sore is 7 with new Afib but not appropriate for anticoagulation due to melena/GI bleed.  SVT/new onset A. Fib with RVR -Patient went into rapid A. Fib on 11/24. - now off Cardizem drip,  heart rate improved with increased doses of metoprolol  - 2-D echocardiogram noted normal ejection fraction , TSH normal.  -  poor candidate for anticoagulation at this time due to melena/GI blood loss anemia  -Anticoagulation should be considered down the road when bleeding no longer an active issue, family has been alerted regarding stroke risk while not on anticoagulation at this time   Acute respiratory  failure with hypoxia ([place]) -Secondary tofluid overload and COPD.  -improved. continue HD now for volume management.  completed steroids taper.continue PRN nebulizer.  Leukocytosis.  Likely due to steroids. no signs of infection  Metabolic acidosis due to renal failure.  Cont HD  HTN (hypertension). -Now stable, continue beta blocker   Elevated troponin -Due to pulmonary edema, demand ischemia  -Echo noted normal ejection fraction  -Continue BB  Diabetes mellitus type 2 in obese.  continue SSI.  A1C of  5.7   Mixed hyperlipidemia.  continue statin   Severe fatigue/ depression with flat affect.  Needs SNF   Code Status: full. Overall long-term prognosis is guarded. discussed overall poor prognosis with patient and sister at bedside   Family Communication: sister at bedside Disposition Plan: SNF in 48hours if stable    Consultants:  nephro  Palliative care  IR    Procedures:  HD cath by surgery  Tunneled dialysis cath per IR  MRI brain  Carotid Doppler  2-D echo    Lab Results  Component Value Date   PLT 149 (L) 01/24/2017    Antibiotics  :    Anti-infectives (From admission, onward)   Start     Dose/Rate Route Frequency Ordered Stop   01/14/17 1415  ceFAZolin (ANCEF) IVPB 2g/100 mL premix     2 g 200 mL/hr over 30 Minutes Intravenous To Radiology 01/14/17 1413 01/14/17 1455        Objective:   Vitals:   01/24/17 0300 01/24/17 0400 01/24/17 0500 01/24/17 0804  BP: (!) 114/57 136/68 (!) 149/75   Pulse: 98 98 (!) 101   Resp: 15 14 (!) 22   Temp:  97.6 F (36.4 C)  98.5 F (36.9 C)  TempSrc:  Axillary  Oral  SpO2: 95% 98% 91% 98%  Weight:   86.7 kg (191 lb 2.2 oz)   Height:        Wt Readings from Last 3 Encounters:  01/24/17 86.7 kg (191  lb 2.2 oz)  12/28/16 83.5 kg (184 lb)  11/18/16 83.5 kg (184 lb)     Intake/Output Summary (Last 24 hours) at 01/24/2017 1345 Last data filed at 01/23/2017 2000 Gross per 24  hour  Intake 240 ml  Output -  Net 240 ml     Physical Exam Gen: Chronically ill, frail female, resting in bed, no distress HEENT: PERRLA, Neck supple, no JVD Lungs: Decreased breath sounds to bases, clear anteriorly CVS: S1-S2/irregularly irregular Abd: soft, Non tender, non distended, BS present Extremities: Trace edema Skin: no new rashes Neuro: L hemiplegia    Data Review:    CBC Recent Labs  Lab 01/21/17 0451 01/21/17 1552 01/21/17 2224 01/22/17 0443 01/23/17 0444 01/24/17 0438  WBC 8.0 9.8  --  10.6* 8.1 8.0  HGB 6.6* 9.8* 9.3* 10.0* 8.2* 8.9*  HCT 21.0* 30.0* 28.4* 31.2* 26.3* 28.4*  PLT 174 168  --  179 152 149*  MCV 90.9 88.5  --  90.4 92.3 93.1  MCH 28.6 28.9  --  29.0 28.8 29.2  MCHC 31.4 32.7  --  32.1 31.2 31.3  RDW 16.1* 15.5  --  16.5* 16.5* 16.8*    Chemistries  Recent Labs  Lab 01/19/17 0412 01/21/17 0451 01/22/17 0443 01/23/17 0444 01/24/17 0438  NA 133* 133* 137 137 137  K 4.3 3.8 3.6 3.5 4.0  CL 97* 97* 99* 104 102  CO2 _0 GLUCOSE 148* 122* 162* 153* 149*  BUN 70* 39* 26* 40* 60*  CREATININE 3.44* 3.45* 2.81* 4.01* 5.22*  CALCIUM 6.6* 6.8* 7.7* 7.1* 7.0*   ------------------------------------------------------------------------------------------------------------------ Recent Labs    01/22/17 0443  CHOL 103  HDL 37*  LDLCALC 45  TRIG 106  CHOLHDL 2.8    Lab Results  Component Value Date   HGBA1C 5.1 01/22/2017   ------------------------------------------------------------------------------------------------------------------ Recent Labs    01/23/17 0444  TSH 1.359   ------------------------------------------------------------------------------------------------------------------ No results for input(s): VITAMINB12, FOLATE, FERRITIN, TIBC, IRON, RETICCTPCT in the last 72 hours.  Coagulation profile No results for input(s): INR, PROTIME in the last 168 hours.  No results for input(s): DDIMER in the last  72 hours.  Cardiac Enzymes No results for input(s): CKMB, TROPONINI, MYOGLOBIN in the last 168 hours.  Invalid input(s): CK ------------------------------------------------------------------------------------------------------------------    Component Value Date/Time   BNP 966.0 (H) 01/06/2017 1655    Inpatient Medications  Scheduled Meds: . atorvastatin  40 mg Oral q1800  . budesonide (PULMICORT) nebulizer solution  0.5 mg Nebulization BID  . calcitRIOL  0.25 mcg Oral Daily  . Chlorhexidine Gluconate Cloth  6 each Topical Daily  . epoetin (EPOGEN/PROCRIT) injection  10,000 Units Intravenous Q M,W,F-HD  . feeding supplement  1 Container Oral TID BM  . hydrALAZINE  50 mg Oral TID  . insulin aspart  0-15 Units Subcutaneous TID WC  . insulin aspart  0-5 Units Subcutaneous QHS  . ipratropium-albuterol  3 mL Nebulization Q6H  . loratadine  10 mg Oral Daily  . mouth rinse  15 mL Mouth Rinse BID  . metoprolol tartrate  50 mg Oral BID  . pantoprazole  40 mg Oral BID AC  . sodium chloride flush  10-40 mL Intracatheter Q12H  . sodium chloride flush  10-40 mL Intracatheter Q12H   Continuous Infusions: . sodium chloride    . sodium chloride    . diltiazem (CARDIZEM) infusion     PRN Meds:.sodium chloride, sodium chloride, acetaminophen, alteplase, alum & mag hydroxide-simeth, lidocaine (PF), lidocaine-prilocaine, metoprolol  tartrate, ondansetron (ZOFRAN) IV, ondansetron (ZOFRAN) IV, pentafluoroprop-tetrafluoroeth, sodium chloride flush, sodium chloride flush  Micro Results Recent Results (from the past 240 hour(s))  MRSA PCR Screening     Status: None   Collection Time: 01/22/17  7:21 PM  Result Value Ref Range Status   MRSA by PCR NEGATIVE NEGATIVE Final    Comment:        The GeneXpert MRSA Assay (FDA approved for NASAL specimens only), is one component of a comprehensive MRSA colonization surveillance program. It is not intended to diagnose MRSA infection nor to guide  or monitor treatment for MRSA infections.     Radiology Reports Ct Abdomen Pelvis Wo Contrast  Result Date: 01/07/2017 CLINICAL DATA:  Abdominal pain. Decreased hemoglobin. Clinical suspicion for retroperitoneal hemorrhage. EXAM: CT ABDOMEN AND PELVIS WITHOUT CONTRAST TECHNIQUE: Multidetector CT imaging of the abdomen and pelvis was performed following the standard protocol without IV contrast. COMPARISON:  None. FINDINGS: Lower chest: Bibasilar heterogeneous airspace disease and small bilateral pleural effusions. Hepatobiliary: No mass visualized on this unenhanced exam. Gallbladder is unremarkable. Pancreas: No mass or inflammatory process visualized on this unenhanced exam. Spleen:  Within normal limits in size. Adrenals/Urinary tract: Bilateral renal parenchymal atrophy, left side greater than right, consistent with history of chronic kidney disease. Renal vascular calcification noted. Several tiny calculi are seen within the left renal collecting system and proximal left ureter, without hydronephrosis. Unremarkable unopacified urinary bladder. Stomach/Bowel: No evidence of obstruction, inflammatory process, or abnormal fluid collections. Normal appendix visualized. Mild left-sided colonic diverticulosis seen, without evidence of diverticulitis. Vascular/Lymphatic: No pathologically enlarged lymph nodes identified. Infrarenal abdominal aortic aneurysm measuring 3.4 x 3.3 cm. Reproductive: Prior hysterectomy noted. Adnexal regions are unremarkable in appearance. Other: No evidence of retroperitoneal hemorrhage or hemoperitoneum. Mild edema in subcutaneous tissues. Musculoskeletal:  No suspicious bone lesions identified. IMPRESSION: No evidence of retroperitoneal hemorrhage or hemoperitoneum. Several tiny calculi in left renal collecting system and proximal left ureter, without hydronephrosis. Bilateral renal parenchymal atrophy, left side greater than right. Colonic diverticulosis, without radiographic  evidence of diverticulitis. Small bilateral pleural effusions and bilateral lower lobe airspace disease. 3.4 cm infrarenal abdominal aortic aneurysm. Recommend followup by ultrasound in 3 years. This recommendation follows ACR consensus guidelines: White Paper of the ACR Incidental Findings Committee II on Vascular Findings. Natasha Mead Coll Radiol 2013; 10:789-794 Electronically Signed   By: Earle Gell M.D.   On: 01/07/2017 10:22   Dg Chest 2 View  Result Date: 01/06/2017 CLINICAL DATA:  Trouble breathing x1 day with tightness and congestion in chest. Weakness. Hx of diabetes, HTN-controlled with medication, and low hemoglobin. 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 EXAM: CHEST  2 VIEW COMPARISON:  10/19/2016 FINDINGS: The heart is enlarged. There is pulmonary vascular congestion. Prominent interstitial markings are consistent with mild edema. Trace bilateral pleural effusions are also suspected. There is mild bibasilar atelectasis. IMPRESSION: Cardiomegaly and mild pulmonary edema. Electronically Signed   By: Nolon Nations M.D.   On: 01/06/2017 16:56   Ct Head Wo Contrast  Result Date: 01/19/2017 CLINICAL DATA:  Unexplained altered level of consciousness. EXAM: CT HEAD WITHOUT CONTRAST TECHNIQUE: Contiguous axial images were obtained from the base of the skull through the vertex without intravenous contrast. COMPARISON:  Brain MRI 09/17/2013 FINDINGS: Brain: Confluent low-density in the cerebral white matter consistent with chronic small vessel ischemia. Remote moderate to large right PCA distribution affecting the inferior temporal and occipital lobes. Small remote high right posterior frontal and left parietal cortex infarcts. Patchy indistinct bilateral  deep gray nuclei consistent with chronic small vessel ischemic injury. Remote right pontine infarct. Remote small vessel left cerebellar infarct. The left thalamus is asymmetrically low-density compared to the right common mildly expanded.  This could reflect a recent infarct. No hemorrhage, hydrocephalus, or shift. Vascular: Atherosclerotic calcification. Small right ICA at the skullbase. There was right ICA occlusion on prior exam. No hyperdense vessel Skull: No acute or aggressive finding. Sinuses/Orbits: No acute finding IMPRESSION: 1. Compared to 2015 there is patchy low-density in the left thalamus with mild expansion. This may reflect a recent infarct if there is associated deficit. MRI could characterize if clinically appropriate. 2. Advanced chronic ischemic injury as noted above. 3. 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 Electronically Signed   By: Monte Fantasia M.D.   On: 01/19/2017 15:50   Mr Brain Wo Contrast  Result Date: 01/21/2017 CLINICAL DATA:  66 year old female with altered mental status for 2 days. Abnormal density in the left thalamus new compared to 2015 on recent head CT. EXAM: MRI HEAD WITHOUT CONTRAST TECHNIQUE: Multiplanar, multiecho pulse sequences of the brain and surrounding structures were obtained without intravenous contrast. COMPARISON:  Head CT without contrast 01/19/2017. Brain MRI 09/17/2013. FINDINGS: Brain: The only possible focus of acutely restricted diffusion is a small area in the right caudate nucleus on series 3, image 87. Mild T2 and FLAIR hyperintensity with no hemorrhage or mass effect. Severe chronic ischemic disease. Cortical encephalomalacia in the right greater than left MCA and right PCA territories with advanced Wallerian degeneration in the right brainstem. Progressed since 2015 and confluent abnormal signal in the left deep gray matter nuclei, but mostly facilitated on diffusion indicating subacute to chronic time course. Progressed chronic small vessel ischemia in the left cerebellum since 2015. Progressed patchy and confluent left MCA territory white matter T2 and FLAIR hyperintensity. Scattered post ischemic hemosiderin and occasional other chronic micro  hemorrhages. No midline shift, mass effect, evidence of mass lesion, ventriculomegaly, extra-axial collection or acute intracranial hemorrhage. Cervicomedullary junction and pituitary are within normal limits. 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 Other Major intracranial vascular flow voids are stable. The distal right vertebral artery is dominant. Skull and upper cervical spine: Negative. Visualized bone marrow signal is within normal limits. Sinuses/Orbits: Stable orbits soft tissues. Mild paranasal sinus mucosal thickening has not significantly changed. Other: Mild left mastoid effusion is new but significance is doubtful. Small volume retained secretions in the left nasopharynx. Right mastoids are clear. Scalp and face soft tissues appear negative. IMPRESSION: 1. Possible small acute lacunar infarct in the right caudate nucleus. No associated hemorrhage or mass effect. 2. No other acute ischemia identified, but very severe chronic ischemic disease. Progression in the left deep gray matter nuclei and the left cerebellum since 2015. Right greater than left chronic MCA and right PCA territory encephalomalacia with advanced Wallerian degeneration in the right brainstem. 3. Chronic right ICA occlusion. Electronically Signed   By: Genevie Ann M.D.   On: 01/21/2017 07:56   US Carotid Bilateral  Result Date: 01/22/2017 CLINICAL DATA:  CVA. 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 Status post left carotid endarterectomy in 2014. EXAM: BILATERAL CAROTID DUPLEX ULTRASOUND TECHNIQUE: Pearline Cables scale imaging, color Doppler and duplex ultrasound were performed of bilateral carotid and vertebral arteries in the neck. COMPARISON:  Brain MRI 01/21/2017. Carotid duplex report from 06/11/2014 FINDINGS: Criteria: Quantification of carotid stenosis is based on velocity parameters that correlate  the residual internal carotid diameter with NASCET-based stenosis  levels, using the diameter of the distal internal carotid lumen as the denominator for stenosis measurement. The following velocity measurements were obtained: RIGHT ICA:  Occluded CCA:  94 cm/sec ECA:  234 cm/sec LEFT ICA:  165 cm/sec CCA:  426 cm/sec SYSTOLIC ICA/CCA RATIO:  1.2 DIASTOLIC ICA/CCA RATIO:  1.8 ECA:  176 cm/sec RIGHT CAROTID ARTERY: Heterogeneous plaque in the proximal common carotid artery. Irregular echogenic plaque at the right carotid bulb. Occlusion of the internal carotid artery near the origin. Right external carotid artery is patent. RIGHT VERTEBRAL ARTERY: Antegrade flow and normal waveform in the right vertebral artery. LEFT CAROTID ARTERY: Small amount of plaque in left common carotid artery. Left carotid bulb is patulous and compatible with previous endarterectomy. External carotid artery is patent with normal waveform. Small amount of plaque in the left internal carotid artery. Mild tortuosity in the distal internal carotid artery with a slightly elevated peak systolic velocity measuring up to 165 cm/sec. LEFT VERTEBRAL ARTERY: Retrograde flow in the left vertebral artery. This finding appears chronic based on the previous ultrasound report. IMPRESSION: Chronic occlusion of the right internal carotid artery. Surgical changes compatible with a left carotid endarterectomy. Mild plaque and tortuosity in the left internal carotid artery. Estimated degree of stenosis in the left internal carotid artery is less than 50%. Retrograde flow in the left vertebral artery. 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 This appears to be a chronic finding. Antegrade flow in the right vertebral artery. Electronically Signed   By: Markus Daft M.D.   On: 01/22/2017 08:59   US Venous Img Lower Bilateral  Result Date: 01/19/2017 CLINICAL DATA:  Bilateral lower extremity swelling. EXAM: BILATERAL LOWER EXTREMITY VENOUS DOPPLER ULTRASOUND TECHNIQUE:  Gray-scale sonography with graded compression, as well as color Doppler and duplex ultrasound were performed to evaluate the lower extremity deep venous systems from the level of the common femoral vein and including the common femoral, femoral, profunda femoral, popliteal and calf veins including the posterior tibial, peroneal and gastrocnemius veins when visible. The superficial great saphenous vein was also interrogated. Spectral Doppler was utilized to evaluate flow at rest and with distal augmentation maneuvers in the common femoral, femoral and popliteal veins. COMPARISON:  10/13/2016 FINDINGS: RIGHT LOWER EXTREMITY Common Femoral Vein: No evidence of thrombus. Normal compressibility, respiratory phasicity and response to augmentation. Saphenofemoral Junction: No evidence of thrombus. Normal compressibility and flow on color Doppler imaging. Profunda Femoral Vein: No evidence of thrombus. Normal compressibility and flow on color Doppler imaging. Femoral Vein: No evidence of thrombus. Normal compressibility, respiratory phasicity and response to augmentation. Popliteal Vein: No evidence of thrombus. Normal compressibility, respiratory phasicity and response to augmentation. Calf Veins: No evidence of thrombus. Normal compressibility and flow on color Doppler imaging. Superficial Great Saphenous Vein: No evidence of thrombus. Normal compressibility. Venous Reflux:  None. Other Findings:  None. LEFT LOWER EXTREMITY Common Femoral Vein: No evidence of thrombus. Normal compressibility, respiratory phasicity and response to augmentation. Saphenofemoral Junction: No evidence of thrombus. Normal compressibility and flow on color Doppler imaging. Profunda Femoral Vein: No evidence of thrombus. Normal compressibility and flow on color Doppler imaging. Femoral Vein: No evidence of thrombus. Normal compressibility, respiratory phasicity and response to augmentation. Popliteal Vein: No evidence of thrombus. Normal  compressibility, respiratory phasicity and response to augmentation. Calf Veins: No evidence of thrombus. Normal compressibility and flow on color Doppler imaging. Superficial Great Saphenous Vein: No evidence of thrombus.  Normal compressibility. Venous Reflux:  None. Other Findings:  None. IMPRESSION: No evidence of deep venous thrombosis. Electronically Signed   By: Rolm Baptise M.D.   On: 01/19/2017 15:35   Ir Fluoro Guide Cv Line Left  Result Date: 01/14/2017 INDICATION: End-stage renal disease, in need of durable intravenous access for continuation of dialysis. EXAM: TUNNELED CENTRAL VENOUS HEMODIALYSIS CATHETER PLACEMENT WITH ULTRASOUND AND FLUOROSCOPIC GUIDANCE MEDICATIONS: Ancef 2 gm IV . The antibiotic was given in an appropriate time interval prior to skin puncture. ANESTHESIA/SEDATION: Versed 2 mg IV; Fentanyl 50 mcg IV; Moderate Sedation Time:  16 minutes The patient was continuously monitored during the procedure by the interventional radiology nurse under my direct supervision. FLUOROSCOPY TIME:  Fluoroscopy Time: 36 seconds (5 mGy). COMPLICATIONS: None immediate. PROCEDURE: Informed written consent was obtained from the patient after a discussion of the risks, benefits, and alternatives to treatment. Questions regarding the procedure were encouraged and answered. The left neck and chest were prepped with chlorhexidine in a sterile fashion, and a sterile drape was applied covering the operative field. Maximum barrier sterile technique with sterile gowns and gloves were used for the procedure. A timeout was performed prior to the initiation of the procedure. After creating a small venotomy incision, a micropuncture kit was utilized to access the internal jugular vein. Real-time ultrasound guidance was utilized for vascular access including the acquisition of a permanent ultrasound image documenting patency of the accessed vessel. The microwire was utilized to measure appropriate catheter length. A  stiff Glidewire was advanced to the level of the IVC and the micropuncture sheath was exchanged for a peel-away sheath. A palindrome tunneled hemodialysis catheter measuring 23 cm from tip to cuff was tunneled in a retrograde fashion from the anterior chest wall to the venotomy incision. The catheter was then placed through the peel-away sheath with tips ultimately positioned within the superior aspect of the right atrium. Final catheter positioning was confirmed and documented with a spot radiographic image. The catheter aspirates and flushes normally. The catheter was flushed with appropriate volume heparin dwells. The catheter exit site was secured with a 0-Prolene retention suture. The venotomy incision was closed with an interrupted 4-0 Vicryl, Dermabond and Steri-strips. Dressings were applied. The patient tolerated the procedure well without immediate post procedural complication. IMPRESSION: Successful placement of 23 cm tip to cuff tunneled hemodialysis catheter via the left internal jugular vein with tips terminating within the superior aspect of the right atrium. The catheter is ready for immediate use. Electronically Signed   By: Sandi Mariscal M.D.   On: 01/14/2017 15:28   Ir US Guide Vasc Access Left  Result Date: 01/14/2017 INDICATION: End-stage renal disease, in need of durable intravenous access for continuation of dialysis. EXAM: TUNNELED CENTRAL VENOUS HEMODIALYSIS CATHETER PLACEMENT WITH ULTRASOUND AND FLUOROSCOPIC GUIDANCE MEDICATIONS: Ancef 2 gm IV . The antibiotic was given in an appropriate time interval prior to skin puncture. ANESTHESIA/SEDATION: Versed 2 mg IV; Fentanyl 50 mcg IV; Moderate Sedation Time:  16 minutes The patient was continuously monitored during the procedure by the interventional radiology nurse under my direct supervision. FLUOROSCOPY TIME:  Fluoroscopy Time: 36 seconds (5 mGy). COMPLICATIONS: None immediate. PROCEDURE: Informed written consent was obtained from the  patient after a discussion of the risks, benefits, and alternatives to treatment. Questions regarding the procedure were encouraged and answered. The left neck and chest were prepped with chlorhexidine in a sterile fashion, and a sterile drape was applied covering the operative field. Maximum barrier sterile technique with sterile  gowns and gloves were used for the procedure. A timeout was performed prior to the initiation of the procedure. After creating a small venotomy incision, a micropuncture kit was utilized to access the internal jugular vein. Real-time ultrasound guidance was utilized for vascular access including the acquisition of a permanent ultrasound image documenting patency of the accessed vessel. The microwire was utilized to measure appropriate catheter length. A stiff Glidewire was advanced to the level of the IVC and the micropuncture sheath was exchanged for a peel-away sheath. A palindrome tunneled hemodialysis catheter measuring 23 cm from tip to cuff was tunneled in a retrograde fashion from the anterior chest wall to the venotomy incision. The catheter was then placed through the peel-away sheath with tips ultimately positioned within the superior aspect of the right atrium. Final catheter positioning was confirmed and documented with a spot radiographic image. The catheter aspirates and flushes normally. The catheter was flushed with appropriate volume heparin dwells. The catheter exit site was secured with a 0-Prolene retention suture. The venotomy incision was closed with an interrupted 4-0 Vicryl, Dermabond and Steri-strips. Dressings were applied. The patient tolerated the procedure well without immediate post procedural complication. IMPRESSION: Successful placement of 23 cm tip to cuff tunneled hemodialysis catheter via the left internal jugular vein with tips terminating within the superior aspect of the right atrium. The catheter is ready for immediate use. Electronically Signed    By: Sandi Mariscal M.D.   On: 01/14/2017 15:28   Dg Chest Port 1 View  Result Date: 01/22/2017 CLINICAL DATA:  66 year old female with history of shortness of breath. EXAM: PORTABLE CHEST 1 VIEW COMPARISON:  Chest x-ray 01/20/2017. FINDINGS: There is a right-sided internal jugular central venous catheter with tip terminating in the superior cavoatrial junction. Left-sided internal jugular PermCath with tip terminating in the superior cavoatrial junction. Film is underpenetrated limiting the diagnostic sensitivity and specificity of the examination. With this limitation in mind, there is potential retrocardiac opacity in the left lower lobe which could reflect atelectasis and/or airspace consolidation. No definite pleural effusions. There is cephalization of the pulmonary vasculature and slight indistinctness of the interstitial markings suggestive of mild pulmonary edema. Mild cardiomegaly. The patient is rotated to the left on today's exam, resulting in distortion of the mediastinal contours and reduced diagnostic sensitivity and specificity for mediastinal pathology. Aortic atherosclerosis. IMPRESSION: 1. Support apparatus, as above. 2. Overall, the appearance the chest suggests mild congestive heart failure, as discussed above. 3. Possible area of atelectasis and/or consolidation in the left lower lobe. 4. Aortic atherosclerosis. Electronically Signed   By: Vinnie Langton M.D.   On: 01/22/2017 18:15   Dg Chest Port 1 View  Result Date: 01/20/2017 CLINICAL DATA:  Altered mental status short of breath EXAM: PORTABLE CHEST 1 VIEW COMPARISON:  01/09/2017 FINDINGS: Interval placement of left jugular dual-lumen catheter with tip in the SVC. Right jugular catheter tip in the SVC unchanged. No pneumothorax Interval improvement in diffuse bilateral airspace disease. Mild vascular congestion remains with left lower lobe atelectasis and small left effusion IMPRESSION: Left jugular dual-lumen catheter in good  position. Improvement in bilateral edema. Electronically Signed   By: Franchot Gallo M.D.   On: 01/20/2017 08:41   Dg Chest Port 1 View  Result Date: 01/09/2017 CLINICAL DATA:  CHF EXAM: PORTABLE CHEST 1 VIEW COMPARISON:  01/07/2017 FINDINGS: Severe bilateral airspace disease similar to the prior study. Probable congestive heart failure with edema. Pneumonia not excluded. Bibasilar atelectasis and small left effusion. Right jugular central venous  catheter tip in the SVC unchanged IMPRESSION: Diffuse bilateral airspace disease unchanged. Probable heart failure. Electronically Signed   By: Franchot Gallo M.D.   On: 01/09/2017 11:27   Dg Chest Port 1 View  Result Date: 01/07/2017 CLINICAL DATA:  65 year old female central line placement. EXAM: PORTABLE CHEST 1 VIEW COMPARISON:  1016 hours today and earlier. FINDINGS: Portable AP semi upright view at 1432 hours. New right IJ approach central line is in place, tip projects at the level of the right mainstem bronchus corresponding to the lower SVC. No pneumothorax. Stable cardiac size and mediastinal contours. Stable bilateral ventilation since this morning including dense retrocardiac opacity which has progressed since 01/06/2017. Visualized tracheal air column is within normal limits. IMPRESSION: 1. Right IJ central line tip at the lower SVC level. No pneumothorax or adverse features. 2. Stable ventilation since 1016 hours today. Electronically Signed   By: Genevie Ann M.D.   On: 01/07/2017 15:02   Dg Chest Port 1 View  Result Date: 01/07/2017 CLINICAL DATA:  Respiratory distress. EXAM: PORTABLE CHEST 1 VIEW COMPARISON:  01/06/2017 FINDINGS: Lungs are adequately inflated with worsening patchy multifocal airspace opacification most notable over the left midlung and right upper lobe likely a multifocal infectious process. Possible small amount left pleural fluid. Mild stable cardiomegaly. Remainder of the exam is unchanged. IMPRESSION: Slight worsening bilateral  multifocal airspace process most notable over the left midlung and right upper lobe suggesting multifocal infection. Stable cardiomegaly. Electronically Signed   By: Marin Olp M.D.   On: 01/07/2017 10:37    Time Spent in minutes  35   Domenic Polite M.D on 01/24/2017 at 1:45 PM  Page via [email or web address], password [email or web address], after 7pm please page floor coverage   [facility] -  Office  [phone]

## 2017-01-24 NOTE — Progress Notes (Signed)
PT Cancellation Note  Patient Details Name: Kelli [name] MRN: [id number] DOB: February 06, 1951   Cancelled Treatment:    Reason Eval/Treat Not Completed: Patient at procedure or test/unavailable.  Patient not seen due to starting dialysis.  Will check back tomorrow.   1:43 PM, 01/24/17 Lonell Grandchild, MPT Physical Therapist with [facility] [phone] office [phone] mobile phone

## 2017-01-24 NOTE — Progress Notes (Signed)
Subjective: Interval History: Patient complains of some cough and whitish sputum production.  Denies any difficulty breathing.  Overall she is feeling much better  Objective: Vital signs in last 24 hours: Temp:  [97.6 F (36.4 C)-99.3 F (37.4 C)] 97.6 F (36.4 C) (11/26 0400) Pulse Rate:  [82-101] 101 (11/26 0500) Resp:  [13-22] 22 (11/26 0500) BP: (91-151)/(44-75) 149/75 (11/26 0500) SpO2:  [91 %-99 %] 91 % (11/26 0500) Weight:  [86.7 kg (191 lb 2.2 oz)] 86.7 kg (191 lb 2.2 oz) (11/26 0500) Weight change:   Intake/Output from previous day: 11/25 0701 - 11/26 0700 In: 240 [P.O.:240] Out: -  Intake/Output this shift: No intake/output data recorded.  Patient on dialysis Chest is clear to auscultation Heart exam irevealed regular rate and rhythm no murmur Abdomen: Positive bowel sounds Extremities no edema   Lab Results: Recent Labs    01/23/17 0444 01/24/17 0438  WBC 8.1 8.0  HGB 8.2* 8.9*  HCT 26.3* 28.4*  PLT 152 149*   BMET:  Recent Labs    01/23/17 0444 01/24/17 0438  NA 137 137  K 3.5 4.0  CL 104 102  CO2 26 25  GLUCOSE 153* 149*  BUN 40* 60*  CREATININE 4.01* 5.22*  CALCIUM 7.1* 7.0*   No results for input(s): PTH in the last 72 hours. Iron Studies: No results for input(s): IRON, TIBC, TRANSFERRIN, FERRITIN in the last 72 hours.  Studies/Results: Dg Chest Port 1 View  Result Date: 01/22/2017 CLINICAL DATA:  66 year old female with history of shortness of breath. EXAM: PORTABLE CHEST 1 VIEW COMPARISON:  Chest x-ray 01/20/2017. FINDINGS: There is a right-sided internal jugular central venous catheter with tip terminating in the superior cavoatrial junction. Left-sided internal jugular PermCath with tip terminating in the superior cavoatrial junction. Film is underpenetrated limiting the diagnostic sensitivity and specificity of the examination. With this limitation in mind, there is potential retrocardiac opacity in the left lower lobe which could  reflect atelectasis and/or airspace consolidation. No definite pleural effusions. There is cephalization of the pulmonary vasculature and slight indistinctness of the interstitial markings suggestive of mild pulmonary edema. Mild cardiomegaly. The patient is rotated to the left on today's exam, resulting in distortion of the mediastinal contours and reduced diagnostic sensitivity and specificity for mediastinal pathology. Aortic atherosclerosis. IMPRESSION: 1. Support apparatus, as above. 2. Overall, the appearance the chest suggests mild congestive heart failure, as discussed above. 3. Possible area of atelectasis and/or consolidation in the left lower lobe. 4. Aortic atherosclerosis. Electronically Signed   By: Vinnie Langton M.D.   On: 01/22/2017 18:15    I have reviewed the patient's current medications.  Assessment/Plan: Problem #1 renal failure: She is status post hemodialysis on Friday.  Her potassium remains normal and she does not have any nausea or vomiting.  She is due for dialysis today. 2] hyperkalemia: her potassium is normal  3] history of CHF: No sign of fluid overload.  She complains of some congestion, cough with whitish sputum production.   4] bone and mineral disorder: Her calcium and phosphorus is in the range.  Patient is not on a binder. 5] anemia:.  She is status post blood transfusion.  Her hemoglobin remains below our target goal.  Her hemoglobin today seems to be better.  Patient is status post blood transfusion. 6] hypertension: Her blood pressure is reasonably controlled 7] CVA: MRI showed possible small acute lacunar infarct of the right caudate. 8] history of atrial fibrillation: Patient presently in ICU.  Her heart rate  is controlled. Plan:1] will make arrangement for patient to get dialysis tomorrow for 4 hours. 2] will remove about 3 L today. 3] we will check a renal panel in the morning. 4] we will continue with Epogen.     LOS: 18 days   Kelli [name]  S 01/24/2017,7:49 AM

## 2017-01-24 NOTE — Progress Notes (Signed)
PT Cancellation Note  Patient Details Name: Kelli [name] MRN: [id number] DOB: 1950/05/22   Cancelled Treatment:    Reason Eval/Treat Not Completed: Patient not medically ready.  Patient transferred to a higher level of care and will need new physical therapy consult to resume therapy when patient is medically stable.  Thank you.  7:50 AM, 01/24/17 Lonell Grandchild, MPT Physical Therapist with [facility] [phone] office [phone] mobile phone

## 2017-01-24 NOTE — Progress Notes (Signed)
    Subjective: No overt GI bleeding. No N/V. States she does not like her breakfast and would like something different. No GI concerns.   Objective: Vital signs in last 24 hours: Temp:  [97.6 F (36.4 C)-99.3 F (37.4 C)] 97.6 F (36.4 C) (11/26 0400) Pulse Rate:  [82-101] 101 (11/26 0500) Resp:  [13-22] 22 (11/26 0500) BP: (91-151)/(44-75) 149/75 (11/26 0500) SpO2:  [91 %-99 %] 91 % (11/26 0500) Weight:  [191 lb 2.2 oz (86.7 kg)] 191 lb 2.2 oz (86.7 kg) (11/26 0500) Last BM Date: 01/19/17 General:   Alert and oriented, pleasant Head:  Normocephalic and atraumatic. Abdomen:  Bowel sounds present, soft, non-tender, non-distended.  Neurologic:  Alert and  oriented x4 Psych:  Alert and cooperative.   Intake/Output from previous day: 11/25 0701 - 11/26 0700 In: 240 [P.O.:240] Out: -  Intake/Output this shift: No intake/output data recorded.  Lab Results: Recent Labs    01/22/17 0443 01/23/17 0444 01/24/17 0438  WBC 10.6* 8.1 8.0  HGB 10.0* 8.2* 8.9*  HCT 31.2* 26.3* 28.4*  PLT 179 152 149*   BMET Recent Labs    01/22/17 0443 01/23/17 0444 01/24/17 0438  NA 137 137 137  K 3.6 3.5 4.0  CL 99* 104 102  CO2 28 26 25   GLUCOSE 162* 153* 149*  BUN 26* 40* 60*  CREATININE 2.81* 4.01* 5.22*  CALCIUM 7.7* 7.1* 7.0*   LFT Recent Labs    01/22/17 0443  ALBUMIN 3.0*    Studies/Results: Dg Chest Port 1 View  Result Date: 01/22/2017 CLINICAL DATA:  66 year old female with history of shortness of breath. EXAM: PORTABLE CHEST 1 VIEW COMPARISON:  Chest x-ray 01/20/2017. FINDINGS: There is a right-sided internal jugular central venous catheter with tip terminating in the superior cavoatrial junction. Left-sided internal jugular PermCath with tip terminating in the superior cavoatrial junction. Film is underpenetrated limiting the diagnostic sensitivity and specificity of the examination. With this limitation in mind, there is potential retrocardiac opacity in the left  lower lobe which could reflect atelectasis and/or airspace consolidation. No definite pleural effusions. There is cephalization of the pulmonary vasculature and slight indistinctness of the interstitial markings suggestive of mild pulmonary edema. Mild cardiomegaly. The patient is rotated to the left on today's exam, resulting in distortion of the mediastinal contours and reduced diagnostic sensitivity and specificity for mediastinal pathology. Aortic atherosclerosis. IMPRESSION: 1. Support apparatus, as above. 2. Overall, the appearance the chest suggests mild congestive heart failure, as discussed above. 3. Possible area of atelectasis and/or consolidation in the left lower lobe. 4. Aortic atherosclerosis. Electronically Signed   By: Vinnie Langton M.D.   On: 01/22/2017 18:15    Assessment: 66 year old female admitted with transfusion dependent anemia. Hgb stable. Total of 6 units this admission. Unable to completed colonoscopy  due to poor prep. EGD with normal esophagus, medium-sized hiatal hernia, anemia partially due to moderate to severe gastritis and duodenitis. Awaiting pathology. No overt GI bleeding, and Hgb remaining stable. Avoid aspirin but may use Plavix.   Plan: Await pathology PPI BID Hold aspirin, may use Plavix Transfuse prn  Annitta Needs, PhD, ANP-BC [facility]     LOS: 18 days    01/24/2017, 8:09 AM

## 2017-01-25 LAB — CBC
HCT: 27.4 % — ABNORMAL LOW (ref 36.0–46.0)
HEMATOCRIT: 27.8 % — AB (ref 36.0–46.0)
HEMOGLOBIN: 8.7 g/dL — AB (ref 12.0–15.0)
HEMOGLOBIN: 8.7 g/dL — AB (ref 12.0–15.0)
MCH: 29.2 pg (ref 26.0–34.0)
MCH: 29.2 pg (ref 26.0–34.0)
MCHC: 31.3 g/dL (ref 30.0–36.0)
MCHC: 31.8 g/dL (ref 30.0–36.0)
MCV: 93.3 fL (ref 78.0–100.0)
MCV: 91.9 fL (ref 78.0–100.0)
Platelets: 147 10*3/uL — ABNORMAL LOW (ref 150–400)
Platelets: 138 10*3/uL — ABNORMAL LOW (ref 150–400)
RBC: 2.98 MIL/uL — AB (ref 3.87–5.11)
RBC: 2.98 MIL/uL — AB (ref 3.87–5.11)
RDW: 17.1 % — ABNORMAL HIGH (ref 11.5–15.5)
RDW: 16.9 % — ABNORMAL HIGH (ref 11.5–15.5)
WBC: 7.1 10*3/uL (ref 4.0–10.5)
WBC: 6.6 10*3/uL (ref 4.0–10.5)

## 2017-01-25 LAB — RENAL FUNCTION PANEL
ANION GAP: 11 (ref 5–15)
ANION GAP: 8 (ref 5–15)
Albumin: 2.7 g/dL — ABNORMAL LOW (ref 3.5–5.0)
Albumin: 2.7 g/dL — ABNORMAL LOW (ref 3.5–5.0)
BUN: 68 mg/dL — AB (ref 6–20)
BUN: 28 mg/dL — ABNORMAL HIGH (ref 6–20)
CALCIUM: 7 mg/dL — AB (ref 8.9–10.3)
CALCIUM: 7.3 mg/dL — AB (ref 8.9–10.3)
CO2: 23 mmol/L (ref 22–32)
CO2: 28 mmol/L (ref 22–32)
Chloride: 105 mmol/L (ref 101–111)
Chloride: 102 mmol/L (ref 101–111)
Creatinine, Ser: 5 mg/dL — ABNORMAL HIGH (ref 0.44–1.00)
Creatinine, Ser: 2.48 mg/dL — ABNORMAL HIGH (ref 0.44–1.00)
GFR calc Af Amer: 10 mL/min — ABNORMAL LOW (ref >60)
GFR calc non Af Amer: 8 mL/min — ABNORMAL LOW (ref >60)
GFR calc non Af Amer: 19 mL/min — ABNORMAL LOW (ref >60)
GFR, EST AFRICAN AMERICAN: 22 mL/min — AB (ref >60)
GLUCOSE: 142 mg/dL — AB (ref 65–99)
Glucose, Bld: 214 mg/dL — ABNORMAL HIGH (ref 65–99)
PHOSPHORUS: 2.6 mg/dL (ref 2.5–4.6)
Phosphorus: 4.6 mg/dL (ref 2.5–4.6)
Potassium: 4 mmol/L (ref 3.5–5.1)
Potassium: 3.2 mmol/L — ABNORMAL LOW (ref 3.5–5.1)
SODIUM: 139 mmol/L (ref 135–145)
SODIUM: 138 mmol/L (ref 135–145)

## 2017-01-25 LAB — GLUCOSE, CAPILLARY
GLUCOSE-CAPILLARY: 202 mg/dL — AB (ref 65–99)
Glucose-Capillary: 137 mg/dL — ABNORMAL HIGH (ref 65–99)
Glucose-Capillary: 183 mg/dL — ABNORMAL HIGH (ref 65–99)
Glucose-Capillary: 141 mg/dL — ABNORMAL HIGH (ref 65–99)

## 2017-01-25 MED ORDER — ALTEPLASE 2 MG IJ SOLR
2.0000 mg | Freq: Once | INTRAMUSCULAR | Status: DC | PRN
  Filled 2017-01-25: qty 2

## 2017-01-25 MED ORDER — EPOETIN ALFA 10000 UNIT/ML IJ SOLN
10000.0000 [IU] | Freq: Once | INTRAMUSCULAR | Status: AC
  Filled 2017-01-25: qty 1

## 2017-01-25 MED ORDER — HEPARIN SODIUM (PORCINE) 1000 UNIT/ML DIALYSIS
1000.0000 [IU] | INTRAMUSCULAR | Status: DC | PRN
  Filled 2017-01-25: qty 1

## 2017-01-25 MED ORDER — SODIUM CHLORIDE 0.9 % IV SOLN: 100.0000 mL | INTRAVENOUS | Status: DC | PRN

## 2017-01-25 MED ORDER — EPOETIN ALFA 10000 UNIT/ML IJ SOLN
10000.0000 [IU] | Freq: Once | INTRAMUSCULAR | Status: AC
  Administered 2017-01-25: 10000 [IU] via INTRAVENOUS
  Filled 2017-01-25: qty 1

## 2017-01-25 MED ORDER — EPOETIN ALFA 10000 UNIT/ML IJ SOLN: 10000.0000 [IU] | Freq: Once | INTRAMUSCULAR | Status: DC

## 2017-01-25 MED ORDER — HEPARIN SODIUM (PORCINE) 1000 UNIT/ML IJ SOLN
INTRAMUSCULAR | Status: AC
  Filled 2017-01-25: qty 1

## 2017-01-25 NOTE — Evaluation (Signed)
Physical Therapy Evaluation Patient Details Name: Kelli [name] MRN: [id number] DOB: 12/17/1950 Today's Date: 01/25/2017   History of Present Illness  66 yo female with onset of SOB and weakness was admitted, has notable L hemi presentation with chronic O2 use with low CO2, low functional level at baseline.  PMHx:  stroke, CKD, CHF, DM, HTN, L humeral fracture, carotid occlusion, a-fib, low CO2.    Clinical Impression  Patient limited to a few steps during transfer to chair secondary to poor balance, left sided weakness and c/o fatigue.  Patient will benefit from continued physical therapy in hospital and recommended venue below to increase strength, balance, endurance for safe ADLs and gait.      Follow Up Recommendations SNF;Supervision/Assistance - 24 hour    Equipment Recommendations  None recommended by PT    Recommendations for Other Services       Precautions / Restrictions Precautions Precautions: Fall Restrictions Weight Bearing Restrictions: No      Mobility  Bed Mobility Overal bed mobility: Needs Assistance Bed Mobility: Supine to Sit;Sit to Supine     Supine to sit: Mod assist     General bed mobility comments: VC's for weight shifting side to side to scoot forward with fair/good carryover  Transfers Overall transfer level: Needs assistance Equipment used: 1 person hand held assist Transfers: Sit to/from [employer] Transfers Sit to Stand: Mod assist Stand pivot transfers: Mod assist       General transfer comment: limited for standing on RLE due to c/o fatigue and weakness  Ambulation/Gait Ambulation/Gait assistance: Max assist Ambulation Distance (Feet): 2 Feet Assistive device: 1 person hand held assist       General Gait Details: limited to 3-4 short unsteady steps due to poor standing balance/LLE weakness  Stairs            Wheelchair Mobility    Modified Rankin (Stroke Patients Only)       Balance Overall balance  assessment: Needs assistance Sitting-balance support: Feet supported;No upper extremity supported Sitting balance-Leahy Scale: Fair     Standing balance support: Single extremity supported;During functional activity Standing balance-Leahy Scale: Poor Standing balance comment: increased trunk flexion in standing                             Pertinent Vitals/Pain Pain Score: 5  Pain Location: abdomen due to constant coughing Pain Descriptors / Indicators: Sore Pain Intervention(s): Limited activity within patient's tolerance;Monitored during session    Home Living Family/patient expects to be discharged to:: Private residence Living Arrangements: Children Available Help at Discharge: Family Type of Home: House Home Access: Ramped entrance     Home Layout: Two level Home Equipment: Environmental consultant - 2 wheels;Bedside commode;Shower seat;Wheelchair - manual      Prior Function Level of Independence: Needs assistance   Gait / Transfers Assistance Needed: 1 person stand pivot with Min/mod assist  ADL's / Homemaking Assistance Needed: husband present for todays evaluation.  Patient is able to complete feeding and grooming with right hand after setup when she feels up to it.  Is total assist for dressing, bathing, toileting.          Hand Dominance   Dominant Hand: Right    Extremity/Trunk Assessment   Upper Extremity Assessment Upper Extremity Assessment: Generalized weakness;RUE deficits/detail RUE Deficits / Details: grossly 4/5 LUE Deficits / Details: LUE requires assistance for all movements due to weakness; very minimal grip strength/unable to make full fist  with L hand LUE Coordination: decreased gross motor;decreased fine motor    Lower Extremity Assessment Lower Extremity Assessment: Generalized weakness;RLE deficits/detail RLE Deficits / Details: grossly -4/5 LLE Deficits / Details: grossly 2+/5 LLE Coordination: decreased gross motor;decreased fine motor        Communication   Communication: No difficulties  Cognition Arousal/Alertness: Awake/alert Behavior During Therapy: WFL for tasks assessed/performed Overall Cognitive Status: Within Functional Limits for tasks assessed                                        General Comments      Exercises General Exercises - Lower Extremity Ankle Circles/Pumps: Seated;AAROM;AROM;Both;10 reps;Strengthening Long Arc Quad: Seated;AROM;Both;10 reps;Strengthening Hip Flexion/Marching: Seated;AROM;Strengthening;Both;10 reps   Assessment/Plan    PT Assessment Patient needs continued PT services  PT Problem List Decreased strength;Decreased activity tolerance;Decreased balance;Decreased mobility;Decreased range of motion       PT Treatment Interventions Gait training;Functional mobility training;Therapeutic activities;Therapeutic exercise;Patient/family education;Neuromuscular re-education    PT Goals (Current goals can be found in the Care Plan section)  Acute Rehab PT Goals Patient Stated Goal: to get straight home PT Goal Formulation: With patient/family Time For Goal Achievement: 02/08/17 Potential to Achieve Goals: Good    Frequency Min 3X/week   Barriers to discharge        Co-evaluation               AM-PAC PT "6 Clicks" Daily Activity  Outcome Measure Difficulty turning over in bed (including adjusting bedclothes, sheets and blankets)?: A Lot Difficulty moving from lying on back to sitting on the side of the bed? : A Lot Difficulty sitting down on and standing up from a chair with arms (e.g., wheelchair, bedside commode, etc,.)?: A Lot Help needed moving to and from a bed to chair (including a wheelchair)?: A Lot Help needed walking in hospital room?: Total Help needed climbing 3-5 steps with a railing? : Total 6 Click Score: 10    End of Session Equipment Utilized During Treatment: Oxygen Activity Tolerance: Patient limited by fatigue;Patient tolerated  treatment well Patient left: in chair;with call bell/phone within reach;with family/visitor present Nurse Communication: Mobility status PT Visit Diagnosis: Unsteadiness on feet (R26.81);Muscle weakness (generalized) (M62.81);Difficulty in walking, not elsewhere classified (R26.2);Hemiplegia and hemiparesis    Time: 0903-0926 PT Time Calculation (min) (ACUTE ONLY): 23 min   Charges:   PT Evaluation $PT Eval Moderate Complexity: 1 Mod PT Treatments $Therapeutic Activity: 23-37 mins   PT G Codes:        11:59 AM, Feb 14, 2017 Lonell Grandchild, MPT Physical Therapist with [facility] 336 [phone] office [phone] mobile phone

## 2017-01-25 NOTE — Progress Notes (Signed)
PROGRESS NOTE                                                                                                                                                                                                             Patient Demographics:    Kelli [name], is a 66 y.o. female, DOB - 1950/11/12, EYE:[id number]  Admit date - 01/06/2017   Admitting Physician Karmen Bongo, MD  Outpatient Primary MD for the patient is [name], [name], MD  LOS - 19  Outpatient Specialists: none  Chief Complaint  Patient presents with  . Shortness of Breath  . Dysuria  . Chest Pain       Brief Narrative   66 year old female with a history of stroke with residual left hemiparesis, CKD stage III, essential hypertension, diabetes mellitus, peripheral vascular disease presentedwith 1 day history of shortness of breath, worsening leg edema, and chest discomfort. -In ER: Hb 7.6. Chest x-ray showed pulmonary edema with BNP 966. The patientwas started on IV Lasix but remained fluid overloaded and her renal function continued to worsen. Repeat chest x-ray revealed pulmonary edema. Nephrology was consulted to assist with management and finally ended up starting HD, Dr. Constance Haw placed [place] on 01/11/17. Hospital course prolonged with melena needing GI workup and rapid afib. -Also noted to have a small stroke on MRI this admission    Subjective:   HR improving, off Cardizem gtt now   Assessment  & Plan :    Acute on chronic renal failure/acute on chronic diastolic CHF.  - Severe AKI requiring RRT. Still making urine. Nephrology is following. -HD initiated 01/11/17 per Dr.Befekadu -s/p Tunnelled cath placed 01/14/17.  Anemia, cause unclear at the moment. Likely multifactorial, including blood loss anemia. - Result of EGD noted - On Epogen  -Hx of duodenal ulcers on EGD in 2016. She was taken off asa and plavix at some point after that due  to GI bleeding .  -this hospitalization has been complicated by severe anemia and heme positive stools,  -Received a total of 5 units of PRBC this admission, last 2 units given on 11/23 -Seen by gastroenterology, EGD showed severe gastritis and duodenitis GI recommended stopping aspirin and could use Plavix if needed -Colonoscopy attempted but could not be performed due to poor prep -Hemoglobin stable and improving now -Restart Plavix when cleared by GI  please. -In addition has anemia of chronic disease from CKD continue EPo with HD  Hx of Stroke with left hemiparesis and Possible new acute stroke - has a loop recorder in place, with A. fib noted this admission, this is likely the etiology of her previous stroke and current event  -Not on ASA at home due to hx fo GI bleeds, plan to restart Plavix when cleared by GI. -MRI showed POSSIBLE SMALL ACUTE LACUNAR INFARCT in the right caudate nucleus.  - Echo: LVEF-55%. No defect or patent foramen ovale was identified.  -patient denies acute or new focal neuro symptoms. She is not a good candidate for dual antiplatelets or anticoagulation therapy due to active melena. 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 -A1C 5.7. -Her CHADS2vasc sore is 7 with new Afib but not appropriate for anticoagulation due to melena/GI bleed.  SVT/new onset A. Fib with RVR -Patient went into rapid A. Fib on 11/24. - now off Cardizem drip,  heart rate improved with increased doses of metoprolol  - 2-D echocardiogram noted normal ejection fraction , TSH normal.  -  poor candidate for anticoagulation at this time due to melena/GI blood loss anemia  -Anticoagulation should be considered down the road when bleeding no longer an active issue, family has been alerted regarding stroke risk while not on anticoagulation at this time   Acute respiratory failure with hypoxia ([place]) -Secondary tofluid overload and  COPD.  -improved. continue HD now for volume management.  completed steroids taper.continue PRN nebulizer.  Leukocytosis.  Resolved. Likely due to steroids. no signs of infection  Metabolic acidosis due to renal failure.  Cont HD  HTN (hypertension). -Now stable, continue beta blocker   Elevated troponin -Due to pulmonary edema, demand ischemia  -Echo noted normal ejection fraction  -Continue BB - Etiology of ?Flash pulm edema unclear - Please follow up with Cardiology on DC (Patient has CKD, therefore, high risk patient)  Diabetes mellitus type 2 in obese.  continue SSI.  A1C of  5.7   Mixed hyperlipidemia.  continue statin   Severe fatigue/ depression with flat affect.  Needs SNF   Code Status: full. Overall long-term prognosis is guarded. discussed overall poor prognosis with patient and sister at bedside   Family Communication: sister at bedside Disposition Plan: SNF in 48hours if stable    Consultants:  nephro  Palliative care  IR    Procedures:  HD cath by surgery  Tunneled dialysis cath per IR  MRI brain  Carotid Doppler  2-D echo    Lab Results  Component Value Date   PLT 147 (L) 01/25/2017    Antibiotics  :    Anti-infectives (From admission, onward)   Start     Dose/Rate Route Frequency Ordered Stop   01/14/17 1415  ceFAZolin (ANCEF) IVPB 2g/100 mL premix     2 g 200 mL/hr over 30 Minutes Intravenous To Radiology 01/14/17 1413 01/14/17 1455        Objective:   Vitals:   01/25/17 0130 01/25/17 0400 01/25/17 0500 01/25/17 0750  BP:      Pulse:      Resp:      Temp:  98.7 F (37.1 C)    TempSrc:  Axillary    SpO2: 100%   99%  Weight:   80 kg (176 lb 5.9 oz)   Height:        Wt Readings from Last 3 Encounters:  01/25/17 80 kg (176 lb 5.9 oz)  12/28/16 83.5 kg (184 lb)  11/18/16 83.5 kg (184 lb)     Intake/Output Summary (Last 24 hours) at 01/25/2017 1005 Last data filed at 01/25/2017 0500 Gross per  24 hour  Intake -  Output 800 ml  Net -800 ml     Physical Exam Gen: Chronically ill, frail female, resting in bed, no distress HEENT: PERRLA, Neck supple, no JVD Lungs: Decreased breath sounds to bases, clear anteriorly CVS: S1-S2/irregularly irregular Abd: soft, Non tender, non distended, BS present Extremities: Trace edema Skin: no new rashes Neuro: L hemiplegia    Data Review:    CBC Recent Labs  Lab 01/21/17 1552 01/21/17 2224 01/22/17 0443 01/23/17 0444 01/24/17 0438 01/25/17 0555  WBC 9.8  --  10.6* 8.1 8.0 7.1  HGB 9.8* 9.3* 10.0* 8.2* 8.9* 8.7*  HCT 30.0* 28.4* 31.2* 26.3* 28.4* 27.8*  PLT 168  --  179 152 149* 147*  MCV 88.5  --  90.4 92.3 93.1 93.3  MCH 28.9  --  29.0 28.8 29.2 29.2  MCHC 32.7  --  32.1 31.2 31.3 31.3  RDW 15.5  --  16.5* 16.5* 16.8* 17.1*    Chemistries  Recent Labs  Lab 01/21/17 0451 01/22/17 0443 01/23/17 0444 01/24/17 0438 01/25/17 0555  NA 133* 137 137 137 139  K 3.8 3.6 3.5 4.0 4.0  CL 97* 99* 104 102 105  CO2 28 28 26 25 23   GLUCOSE 122* 162* 153* 149* 142*  BUN 39* 26* 40* 60* 68*  CREATININE 3.45* 2.81* 4.01* 5.22* 5.00*  CALCIUM 6.8* 7.7* 7.1* 7.0* 7.0*   ------------------------------------------------------------------------------------------------------------------ No results for input(s): CHOL, HDL, LDLCALC, TRIG, CHOLHDL, LDLDIRECT in the last 72 hours.  Lab Results  Component Value Date   HGBA1C 5.1 01/22/2017   ------------------------------------------------------------------------------------------------------------------ Recent Labs    01/23/17 0444  TSH 1.359   ------------------------------------------------------------------------------------------------------------------ No results for input(s): VITAMINB12, FOLATE, FERRITIN, TIBC, IRON, RETICCTPCT in the last 72 hours.  Coagulation profile No results for input(s): INR, PROTIME in the last 168 hours.  No results for input(s): DDIMER in the  last 72 hours.  Cardiac Enzymes No results for input(s): CKMB, TROPONINI, MYOGLOBIN in the last 168 hours.  Invalid input(s): CK ------------------------------------------------------------------------------------------------------------------    Component Value Date/Time   BNP 966.0 (H) 01/06/2017 1655    Inpatient Medications  Scheduled Meds: . atorvastatin  40 mg Oral q1800  . budesonide (PULMICORT) nebulizer solution  0.5 mg Nebulization BID  . calcitRIOL  0.25 mcg Oral Daily  . Chlorhexidine Gluconate Cloth  6 each Topical Daily  . epoetin (EPOGEN/PROCRIT) injection  10,000 Units Intravenous Q M,W,F-HD  . feeding supplement  1 Container Oral TID BM  . hydrALAZINE  50 mg Oral TID  . insulin aspart  0-15 Units Subcutaneous TID WC  . insulin aspart  0-5 Units Subcutaneous QHS  . ipratropium-albuterol  3 mL Nebulization Q6H  . loratadine  10 mg Oral Daily  . mouth rinse  15 mL Mouth Rinse BID  . metoprolol tartrate  50 mg Oral BID  . pantoprazole  40 mg Oral BID AC  . sodium chloride flush  10-40 mL Intracatheter Q12H  . sodium chloride flush  10-40 mL Intracatheter Q12H   Continuous Infusions: . sodium chloride    . diltiazem (CARDIZEM) infusion     PRN Meds:.sodium chloride, acetaminophen, alum & mag hydroxide-simeth, guaiFENesin-dextromethorphan, heparin, lidocaine (PF), lidocaine-prilocaine, metoprolol tartrate, ondansetron (ZOFRAN) IV, pentafluoroprop-tetrafluoroeth, sodium chloride flush  Micro Results Recent  Results (from the past 240 hour(s))  MRSA PCR Screening     Status: None   Collection Time: 01/22/17  7:21 PM  Result Value Ref Range Status   MRSA by PCR NEGATIVE NEGATIVE Final    Comment:        The GeneXpert MRSA Assay (FDA approved for NASAL specimens only), is one component of a comprehensive MRSA colonization surveillance program. It is not intended to diagnose MRSA infection nor to guide or monitor treatment for MRSA infections.      Radiology Reports Ct Abdomen Pelvis Wo Contrast  Result Date: 01/07/2017 CLINICAL DATA:  Abdominal pain. Decreased hemoglobin. Clinical suspicion for retroperitoneal hemorrhage. EXAM: CT ABDOMEN AND PELVIS WITHOUT CONTRAST TECHNIQUE: Multidetector CT imaging of the abdomen and pelvis was performed following the standard protocol without IV contrast. COMPARISON:  None. FINDINGS: Lower chest: Bibasilar heterogeneous airspace disease and small bilateral pleural effusions. Hepatobiliary: No mass visualized on this unenhanced exam. Gallbladder is unremarkable. Pancreas: No mass or inflammatory process visualized on this unenhanced exam. Spleen:  Within normal limits in size. Adrenals/Urinary tract: Bilateral renal parenchymal atrophy, left side greater than right, consistent with history of chronic kidney disease. Renal vascular calcification noted. Several tiny calculi are seen within the left renal collecting system and proximal left ureter, without hydronephrosis. Unremarkable unopacified urinary bladder. Stomach/Bowel: No evidence of obstruction, inflammatory process, or abnormal fluid collections. Normal appendix visualized. Mild left-sided colonic diverticulosis seen, without evidence of diverticulitis. Vascular/Lymphatic: No pathologically enlarged lymph nodes identified. Infrarenal abdominal aortic aneurysm measuring 3.4 x 3.3 cm. Reproductive: Prior hysterectomy noted. Adnexal regions are unremarkable in appearance. Other: No evidence of retroperitoneal hemorrhage or hemoperitoneum. Mild edema in subcutaneous tissues. Musculoskeletal:  No suspicious bone lesions identified. IMPRESSION: No evidence of retroperitoneal hemorrhage or hemoperitoneum. Several tiny calculi in left renal collecting system and proximal left ureter, without hydronephrosis. Bilateral renal parenchymal atrophy, left side greater than right. Colonic diverticulosis, without radiographic evidence of diverticulitis. Small bilateral  pleural effusions and bilateral lower lobe airspace disease. 3.4 cm infrarenal abdominal aortic aneurysm. Recommend followup by ultrasound in 3 years. This recommendation follows ACR consensus guidelines: White Paper of the ACR Incidental Findings Committee II on Vascular Findings. Natasha [employer] Coll Radiol 2013; 10:789-794 Electronically Signed   By: Earle Gell M.D.   On: 01/07/2017 10:22   Dg Chest 2 View  Result Date: 01/06/2017 CLINICAL DATA:  Trouble breathing x1 day with tightness and congestion in chest. Weakness. Hx of diabetes, HTN-controlled with medication, and low hemoglobin. 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 EXAM: CHEST  2 VIEW COMPARISON:  10/19/2016 FINDINGS: The heart is enlarged. There is pulmonary vascular congestion. Prominent interstitial markings are consistent with mild edema. Trace bilateral pleural effusions are also suspected. There is mild bibasilar atelectasis. IMPRESSION: Cardiomegaly and mild pulmonary edema. Electronically Signed   By: Nolon Nations M.D.   On: 01/06/2017 16:56   Ct Head Wo Contrast  Result Date: 01/19/2017 CLINICAL DATA:  Unexplained altered level of consciousness. EXAM: CT HEAD WITHOUT CONTRAST TECHNIQUE: Contiguous axial images were obtained from the base of the skull through the vertex without intravenous contrast. COMPARISON:  Brain MRI 09/17/2013 FINDINGS: Brain: Confluent low-density in the cerebral white matter consistent with chronic small vessel ischemia. Remote moderate to large right PCA distribution affecting the inferior temporal and occipital lobes. Small remote high right posterior frontal and left parietal cortex infarcts. Patchy indistinct bilateral deep gray nuclei consistent with chronic small vessel ischemic injury. Remote right pontine infarct. Remote small vessel left  cerebellar infarct. The left thalamus is asymmetrically low-density compared to the right common mildly expanded. This could reflect a recent infarct. No  hemorrhage, hydrocephalus, or shift. Vascular: Atherosclerotic calcification. Small right ICA at the skullbase. There was right ICA occlusion on prior exam. No hyperdense vessel Skull: No acute or aggressive finding. Sinuses/Orbits: No acute finding IMPRESSION: 1. Compared to 2015 there is patchy low-density in the left thalamus with mild expansion. This may reflect a recent infarct if there is associated deficit. MRI could characterize if clinically appropriate. 2. Advanced chronic ischemic injury as noted above. 3. 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 Electronically Signed   By: Monte Fantasia M.D.   On: 01/19/2017 15:50   Mr Brain Wo Contrast  Result Date: 01/21/2017 CLINICAL DATA:  66 year old female with altered mental status for 2 days. Abnormal density in the left thalamus new compared to 2015 on recent head CT. EXAM: MRI HEAD WITHOUT CONTRAST TECHNIQUE: Multiplanar, multiecho pulse sequences of the brain and surrounding structures were obtained without intravenous contrast. COMPARISON:  Head CT without contrast 01/19/2017. Brain MRI 09/17/2013. FINDINGS: Brain: The only possible focus of acutely restricted diffusion is a small area in the right caudate nucleus on series 3, image 87. Mild T2 and FLAIR hyperintensity with no hemorrhage or mass effect. Severe chronic ischemic disease. Cortical encephalomalacia in the right greater than left MCA and right PCA territories with advanced Wallerian degeneration in the right brainstem. Progressed since 2015 and confluent abnormal signal in the left deep gray matter nuclei, but mostly facilitated on diffusion indicating subacute to chronic time course. Progressed chronic small vessel ischemia in the left cerebellum since 2015. Progressed patchy and confluent left MCA territory white matter T2 and FLAIR hyperintensity. Scattered post ischemic hemosiderin and occasional other chronic micro hemorrhages. No midline shift, mass effect,  evidence of mass lesion, ventriculomegaly, extra-axial collection or acute intracranial hemorrhage. Cervicomedullary junction and pituitary are within normal limits. 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 Other Major intracranial vascular flow voids are stable. The distal right vertebral artery is dominant. Skull and upper cervical spine: Negative. Visualized bone marrow signal is within normal limits. Sinuses/Orbits: Stable orbits soft tissues. Mild paranasal sinus mucosal thickening has not significantly changed. Other: Mild left mastoid effusion is new but significance is doubtful. Small volume retained secretions in the left nasopharynx. Right mastoids are clear. Scalp and face soft tissues appear negative. IMPRESSION: 1. Possible small acute lacunar infarct in the right caudate nucleus. No associated hemorrhage or mass effect. 2. No other acute ischemia identified, but very severe chronic ischemic disease. Progression in the left deep gray matter nuclei and the left cerebellum since 2015. Right greater than left chronic MCA and right PCA territory encephalomalacia with advanced Wallerian degeneration in the right brainstem. 3. Chronic right ICA occlusion. Electronically Signed   By: Genevie Ann M.D.   On: 01/21/2017 07:56   US Carotid Bilateral  Result Date: 01/22/2017 CLINICAL DATA:  CVA. 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 Status post left carotid endarterectomy in 2014. EXAM: BILATERAL CAROTID DUPLEX ULTRASOUND TECHNIQUE: Pearline Cables scale imaging, color Doppler and duplex ultrasound were performed of bilateral carotid and vertebral arteries in the neck. COMPARISON:  Brain MRI 01/21/2017. Carotid duplex report from 06/11/2014 FINDINGS: Criteria: Quantification of carotid stenosis is based on velocity parameters that correlate the residual internal carotid diameter with NASCET-based stenosis levels, using the diameter of the distal  internal carotid  lumen as the denominator for stenosis measurement. The following velocity measurements were obtained: RIGHT ICA:  Occluded CCA:  94 cm/sec ECA:  234 cm/sec LEFT ICA:  165 cm/sec CCA:  657 cm/sec SYSTOLIC ICA/CCA RATIO:  1.2 DIASTOLIC ICA/CCA RATIO:  1.8 ECA:  176 cm/sec RIGHT CAROTID ARTERY: Heterogeneous plaque in the proximal common carotid artery. Irregular echogenic plaque at the right carotid bulb. Occlusion of the internal carotid artery near the origin. Right external carotid artery is patent. RIGHT VERTEBRAL ARTERY: Antegrade flow and normal waveform in the right vertebral artery. LEFT CAROTID ARTERY: Small amount of plaque in left common carotid artery. Left carotid bulb is patulous and compatible with previous endarterectomy. External carotid artery is patent with normal waveform. Small amount of plaque in the left internal carotid artery. Mild tortuosity in the distal internal carotid artery with a slightly elevated peak systolic velocity measuring up to 165 cm/sec. LEFT VERTEBRAL ARTERY: Retrograde flow in the left vertebral artery. This finding appears chronic based on the previous ultrasound report. IMPRESSION: Chronic occlusion of the right internal carotid artery. Surgical changes compatible with a left carotid endarterectomy. Mild plaque and tortuosity in the left internal carotid artery. Estimated degree of stenosis in the left internal carotid artery is less than 50%. Retrograde flow in the left vertebral artery. 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 This appears to be a chronic finding. Antegrade flow in the right vertebral artery. Electronically Signed   By: Markus Daft M.D.   On: 01/22/2017 08:59   US Venous Img Lower Bilateral  Result Date: 01/19/2017 CLINICAL DATA:  Bilateral lower extremity swelling. EXAM: BILATERAL LOWER EXTREMITY VENOUS DOPPLER ULTRASOUND TECHNIQUE: Gray-scale sonography with graded compression,  as well as color Doppler and duplex ultrasound were performed to evaluate the lower extremity deep venous systems from the level of the common femoral vein and including the common femoral, femoral, profunda femoral, popliteal and calf veins including the posterior tibial, peroneal and gastrocnemius veins when visible. The superficial great saphenous vein was also interrogated. Spectral Doppler was utilized to evaluate flow at rest and with distal augmentation maneuvers in the common femoral, femoral and popliteal veins. COMPARISON:  10/13/2016 FINDINGS: RIGHT LOWER EXTREMITY Common Femoral Vein: No evidence of thrombus. Normal compressibility, respiratory phasicity and response to augmentation. Saphenofemoral Junction: No evidence of thrombus. Normal compressibility and flow on color Doppler imaging. Profunda Femoral Vein: No evidence of thrombus. Normal compressibility and flow on color Doppler imaging. Femoral Vein: No evidence of thrombus. Normal compressibility, respiratory phasicity and response to augmentation. Popliteal Vein: No evidence of thrombus. Normal compressibility, respiratory phasicity and response to augmentation. Calf Veins: No evidence of thrombus. Normal compressibility and flow on color Doppler imaging. Superficial Great Saphenous Vein: No evidence of thrombus. Normal compressibility. Venous Reflux:  None. Other Findings:  None. LEFT LOWER EXTREMITY Common Femoral Vein: No evidence of thrombus. Normal compressibility, respiratory phasicity and response to augmentation. Saphenofemoral Junction: No evidence of thrombus. Normal compressibility and flow on color Doppler imaging. Profunda Femoral Vein: No evidence of thrombus. Normal compressibility and flow on color Doppler imaging. Femoral Vein: No evidence of thrombus. Normal compressibility, respiratory phasicity and response to augmentation. Popliteal Vein: No evidence of thrombus. Normal compressibility, respiratory phasicity and response to  augmentation. Calf Veins: No evidence of thrombus. Normal compressibility and flow on color Doppler imaging. Superficial Great Saphenous Vein: No evidence of thrombus. Normal compressibility. Venous Reflux:  None. Other Findings:  None. IMPRESSION: No evidence of deep venous thrombosis. Electronically  Signed   By: Rolm Baptise M.D.   On: 01/19/2017 15:35   Ir Fluoro Guide Cv Line Left  Result Date: 01/14/2017 INDICATION: End-stage renal disease, in need of durable intravenous access for continuation of dialysis. EXAM: TUNNELED CENTRAL VENOUS HEMODIALYSIS CATHETER PLACEMENT WITH ULTRASOUND AND FLUOROSCOPIC GUIDANCE MEDICATIONS: Ancef 2 gm IV . The antibiotic was given in an appropriate time interval prior to skin puncture. ANESTHESIA/SEDATION: Versed 2 mg IV; Fentanyl 50 mcg IV; Moderate Sedation Time:  16 minutes The patient was continuously monitored during the procedure by the interventional radiology nurse under my direct supervision. FLUOROSCOPY TIME:  Fluoroscopy Time: 36 seconds (5 mGy). COMPLICATIONS: None immediate. PROCEDURE: Informed written consent was obtained from the patient after a discussion of the risks, benefits, and alternatives to treatment. Questions regarding the procedure were encouraged and answered. The left neck and chest were prepped with chlorhexidine in a sterile fashion, and a sterile drape was applied covering the operative field. Maximum barrier sterile technique with sterile gowns and gloves were used for the procedure. A timeout was performed prior to the initiation of the procedure. After creating a small venotomy incision, a micropuncture kit was utilized to access the internal jugular vein. Real-time ultrasound guidance was utilized for vascular access including the acquisition of a permanent ultrasound image documenting patency of the accessed vessel. The microwire was utilized to measure appropriate catheter length. A stiff Glidewire was advanced to the level of the IVC  and the micropuncture sheath was exchanged for a peel-away sheath. A palindrome tunneled hemodialysis catheter measuring 23 cm from tip to cuff was tunneled in a retrograde fashion from the anterior chest wall to the venotomy incision. The catheter was then placed through the peel-away sheath with tips ultimately positioned within the superior aspect of the right atrium. Final catheter positioning was confirmed and documented with a spot radiographic image. The catheter aspirates and flushes normally. The catheter was flushed with appropriate volume heparin dwells. The catheter exit site was secured with a 0-Prolene retention suture. The venotomy incision was closed with an interrupted 4-0 Vicryl, Dermabond and Steri-strips. Dressings were applied. The patient tolerated the procedure well without immediate post procedural complication. IMPRESSION: Successful placement of 23 cm tip to cuff tunneled hemodialysis catheter via the left internal jugular vein with tips terminating within the superior aspect of the right atrium. The catheter is ready for immediate use. Electronically Signed   By: Sandi Mariscal M.D.   On: 01/14/2017 15:28   Ir US Guide Vasc Access Left  Result Date: 01/14/2017 INDICATION: End-stage renal disease, in need of durable intravenous access for continuation of dialysis. EXAM: TUNNELED CENTRAL VENOUS HEMODIALYSIS CATHETER PLACEMENT WITH ULTRASOUND AND FLUOROSCOPIC GUIDANCE MEDICATIONS: Ancef 2 gm IV . The antibiotic was given in an appropriate time interval prior to skin puncture. ANESTHESIA/SEDATION: Versed 2 mg IV; Fentanyl 50 mcg IV; Moderate Sedation Time:  16 minutes The patient was continuously monitored during the procedure by the interventional radiology nurse under my direct supervision. FLUOROSCOPY TIME:  Fluoroscopy Time: 36 seconds (5 mGy). COMPLICATIONS: None immediate. PROCEDURE: Informed written consent was obtained from the patient after a discussion of the risks, benefits, and  alternatives to treatment. Questions regarding the procedure were encouraged and answered. The left neck and chest were prepped with chlorhexidine in a sterile fashion, and a sterile drape was applied covering the operative field. Maximum barrier sterile technique with sterile gowns and gloves were used for the procedure. A timeout was performed prior to the initiation of the  procedure. After creating a small venotomy incision, a micropuncture kit was utilized to access the internal jugular vein. Real-time ultrasound guidance was utilized for vascular access including the acquisition of a permanent ultrasound image documenting patency of the accessed vessel. The microwire was utilized to measure appropriate catheter length. A stiff Glidewire was advanced to the level of the IVC and the micropuncture sheath was exchanged for a peel-away sheath. A palindrome tunneled hemodialysis catheter measuring 23 cm from tip to cuff was tunneled in a retrograde fashion from the anterior chest wall to the venotomy incision. The catheter was then placed through the peel-away sheath with tips ultimately positioned within the superior aspect of the right atrium. Final catheter positioning was confirmed and documented with a spot radiographic image. The catheter aspirates and flushes normally. The catheter was flushed with appropriate volume heparin dwells. The catheter exit site was secured with a 0-Prolene retention suture. The venotomy incision was closed with an interrupted 4-0 Vicryl, Dermabond and Steri-strips. Dressings were applied. The patient tolerated the procedure well without immediate post procedural complication. IMPRESSION: Successful placement of 23 cm tip to cuff tunneled hemodialysis catheter via the left internal jugular vein with tips terminating within the superior aspect of the right atrium. The catheter is ready for immediate use. Electronically Signed   By: Sandi Mariscal M.D.   On: 01/14/2017 15:28   Dg Chest  Port 1 View  Result Date: 01/22/2017 CLINICAL DATA:  66 year old female with history of shortness of breath. EXAM: PORTABLE CHEST 1 VIEW COMPARISON:  Chest x-ray 01/20/2017. FINDINGS: There is a right-sided internal jugular central venous catheter with tip terminating in the superior cavoatrial junction. Left-sided internal jugular PermCath with tip terminating in the superior cavoatrial junction. Film is underpenetrated limiting the diagnostic sensitivity and specificity of the examination. With this limitation in mind, there is potential retrocardiac opacity in the left lower lobe which could reflect atelectasis and/or airspace consolidation. No definite pleural effusions. There is cephalization of the pulmonary vasculature and slight indistinctness of the interstitial markings suggestive of mild pulmonary edema. Mild cardiomegaly. The patient is rotated to the left on today's exam, resulting in distortion of the mediastinal contours and reduced diagnostic sensitivity and specificity for mediastinal pathology. Aortic atherosclerosis. IMPRESSION: 1. Support apparatus, as above. 2. Overall, the appearance the chest suggests mild congestive heart failure, as discussed above. 3. Possible area of atelectasis and/or consolidation in the left lower lobe. 4. Aortic atherosclerosis. Electronically Signed   By: Vinnie Langton M.D.   On: 01/22/2017 18:15   Dg Chest Port 1 View  Result Date: 01/20/2017 CLINICAL DATA:  Altered mental status short of breath EXAM: PORTABLE CHEST 1 VIEW COMPARISON:  01/09/2017 FINDINGS: Interval placement of left jugular dual-lumen catheter with tip in the SVC. Right jugular catheter tip in the SVC unchanged. No pneumothorax Interval improvement in diffuse bilateral airspace disease. Mild vascular congestion remains with left lower lobe atelectasis and small left effusion IMPRESSION: Left jugular dual-lumen catheter in good position. Improvement in bilateral edema. Electronically Signed    By: Franchot Gallo M.D.   On: 01/20/2017 08:41   Dg Chest Port 1 View  Result Date: 01/09/2017 CLINICAL DATA:  CHF EXAM: PORTABLE CHEST 1 VIEW COMPARISON:  01/07/2017 FINDINGS: Severe bilateral airspace disease similar to the prior study. Probable congestive heart failure with edema. Pneumonia not excluded. Bibasilar atelectasis and small left effusion. Right jugular central venous catheter tip in the SVC unchanged IMPRESSION: Diffuse bilateral airspace disease unchanged. Probable heart failure. Electronically Signed  By: Franchot Gallo M.D.   On: 01/09/2017 11:27   Dg Chest Port 1 View  Result Date: 01/07/2017 CLINICAL DATA:  66 year old female central line placement. EXAM: PORTABLE CHEST 1 VIEW COMPARISON:  1016 hours today and earlier. FINDINGS: Portable AP semi upright view at 1432 hours. New right IJ approach central line is in place, tip projects at the level of the right mainstem bronchus corresponding to the lower SVC. No pneumothorax. Stable cardiac size and mediastinal contours. Stable bilateral ventilation since this morning including dense retrocardiac opacity which has progressed since 01/06/2017. Visualized tracheal air column is within normal limits. IMPRESSION: 1. Right IJ central line tip at the lower SVC level. No pneumothorax or adverse features. 2. Stable ventilation since 1016 hours today. Electronically Signed   By: Genevie Ann M.D.   On: 01/07/2017 15:02   Dg Chest Port 1 View  Result Date: 01/07/2017 CLINICAL DATA:  Respiratory distress. EXAM: PORTABLE CHEST 1 VIEW COMPARISON:  01/06/2017 FINDINGS: Lungs are adequately inflated with worsening patchy multifocal airspace opacification most notable over the left midlung and right upper lobe likely a multifocal infectious process. Possible small amount left pleural fluid. Mild stable cardiomegaly. Remainder of the exam is unchanged. IMPRESSION: Slight worsening bilateral multifocal airspace process most notable over the left midlung  and right upper lobe suggesting multifocal infection. Stable cardiomegaly. Electronically Signed   By: Marin Olp M.D.   On: 01/07/2017 10:37    Time Spent in minutes  35   Bonnell Public M.D on 01/25/2017 at 10:05 AM  Page via [email or web address], password [email or web address], after 7pm please page floor coverage   [facility] -  Office  [phone]

## 2017-01-25 NOTE — Care Management Important Message (Signed)
Important Message  Patient Details  Name: Kelli [name] MRN: [id number] Date of Birth: [birth year]   Medicare Important Message Given:  Yes    Mercedes Fort, Chauncey Reading, RN 01/25/2017, 8:20 AM

## 2017-01-25 NOTE — Progress Notes (Signed)
    Subjective: No overt GI bleeding. No abdominal pain, N/V. Hungry.   Objective: Vital signs in last 24 hours: Temp:  [98.2 F (36.8 C)-99 F (37.2 C)] 98.7 F (37.1 C) (11/27 0400) Pulse Rate:  [81-112] 103 (11/26 2200) Resp:  [17-24] 17 (11/26 2200) BP: (96-165)/(52-91) 116/62 (11/26 2200) SpO2:  [97 %-100 %] 99 % (11/27 0750) Weight:  [176 lb 5.9 oz (80 kg)-179 lb 7.3 oz (81.4 kg)] 176 lb 5.9 oz (80 kg) (11/27 0500) Last BM Date: 01/19/17 General:   Alert and oriented, flat affect Head:  Normocephalic and atraumatic. Eyes:  No icterus, sclera clear. Conjuctiva pink.  Mouth:  Without lesions, mucosa pink and moist.  Abdomen:  Bowel sounds present, soft, non-tender, non-distended.  Neurologic:  Alert and  oriented x4 Psych:  Alert and cooperative.  Intake/Output from previous day: 11/26 0701 - 11/27 0700 In: -  Out: 800 [Urine:800] Intake/Output this shift: No intake/output data recorded.  Lab Results: Recent Labs    01/23/17 0444 01/24/17 0438 01/25/17 0555  WBC 8.1 8.0 7.1  HGB 8.2* 8.9* 8.7*  HCT 26.3* 28.4* 27.8*  PLT 152 149* 147*   BMET Recent Labs    01/23/17 0444 01/24/17 0438 01/25/17 0555  NA 137 137 139  K 3.5 4.0 4.0  CL 104 102 105  CO2 26 25 23   GLUCOSE 153* 149* 142*  BUN 40* 60* 68*  CREATININE 4.01* 5.22* 5.00*  CALCIUM 7.1* 7.0* 7.0*   LFT Recent Labs    01/25/17 0555  ALBUMIN 2.7*    Assessment: 66 year old female admitted with transfusion dependent anemia. Hgb remains stable, receiving total of 6 units this admission. Unable to complete colonoscopy  due to poor prep. EGD with normal esophagus, medium-sized hiatal hernia, anemia partially due to moderate to severe gastritis and duodenitis. Pathology pending. No overt GI bleeding. Consider benefits versus risks of heparin with dialysis and plavix. Avoid aspirin. Will follow peripherally   Plan: Will follow-up on pathology as it comes available BID PPI Hold aspirin, weigh  risks and benefits of heparin with dialysis and plavix Will follow peripherally  Annitta Needs, PhD, ANP-BC [facility]    LOS: 19 days    01/25/2017, 7:59 AM

## 2017-01-25 NOTE — Care Management Note (Signed)
Case Management Note  Patient Details  Name: Kelli [name] MRN: [id number] Date of Birth: 10-Feb-1951   If discussed at Long Length of Stay Meetings, dates discussed:  01/25/2017  Additional Comments:  Sahra Converse, Chauncey Reading, RN 01/25/2017, 12:08 PM

## 2017-01-25 NOTE — Progress Notes (Signed)
Subjective: Interval History: Still patient complains of some congestion and sputum production.  She denies any difficulty breathing.  Objective: Vital signs in last 24 hours: Temp:  [98.2 F (36.8 C)-99 F (37.2 C)] 98.7 F (37.1 C) (11/27 0400) Pulse Rate:  [88-112] 103 (11/26 2200) Resp:  [17-24] 17 (11/26 2200) BP: (96-125)/(52-91) 116/62 (11/26 2200) SpO2:  [97 %-100 %] 99 % (11/27 0750) Weight:  [80 kg (176 lb 5.9 oz)-81.4 kg (179 lb 7.3 oz)] 80 kg (176 lb 5.9 oz) (11/27 0500) Weight change: -5.3 kg (-11 oz)  Intake/Output from previous day: 11/26 0701 - 11/27 0700 In: -  Out: 800 [Urine:800] Intake/Output this shift: No intake/output data recorded.  Patient on dialysis Chest: She has some expiratory wheezing and inspiratory crackles Heart exam irevealed regular rate and rhythm no murmur Abdomen: Positive bowel sounds Extremities no edema   Lab Results: Recent Labs    01/24/17 0438 01/25/17 0555  WBC 8.0 7.1  HGB 8.9* 8.7*  HCT 28.4* 27.8*  PLT 149* 147*   BMET:  Recent Labs    01/24/17 0438 01/25/17 0555  NA 137 139  K 4.0 4.0  CL 102 105  CO2 25 23  GLUCOSE 149* 142*  BUN 60* 68*  CREATININE 5.22* 5.00*  CALCIUM 7.0* 7.0*   No results for input(s): PTH in the last 72 hours. Iron Studies: No results for input(s): IRON, TIBC, TRANSFERRIN, FERRITIN in the last 72 hours.  Studies/Results: No results found.  I have reviewed the patient's current medications.  Assessment/Plan: Problem #1 renal failure: She is status post hemodialysis on Friday.  An attempt was made yesterday to dialyze her however dialysis was discontinued because of recurrent catheter problem.  Presently patient is being dialyzed heparin free because of the concern with GI bleeding. 2] hyperkalemia: her potassium is normal  3] history of CHF: No sign of fluid overload.  She complains of some congestion, cough with whitish sputum production.   4] bone and mineral disorder: Her  calcium and phosphorus is in the range.  Patient is not on a binder. 5] anemia:.  She is status post blood transfusion.  Her hemoglobin is low but remains a stable. 6] hypertension: Her blood pressure is reasonably controlled 7] CVA: MRI showed possible small acute lacunar infarct of the right caudate. 8] history of atrial fibrillation: Patient presently in ICU.  Her heart rate is controlled. Plan:1] will make arrangement for patient to get dialysis tomorrow for 3hours. 2] will remove about 2 L today. 3] we will check a renal panel in the morning. 4] we will continue with Epogen. 5] if we have recurrent problem of clotting we may try to use low-dose heparin.  I have discussed and also explained to the patient and her husband.     LOS: 19 days   Kelli [name] S 01/25/2017,8:23 AM

## 2017-01-26 LAB — GLUCOSE, CAPILLARY
GLUCOSE-CAPILLARY: 131 mg/dL — AB (ref 65–99)
Glucose-Capillary: 145 mg/dL — ABNORMAL HIGH (ref 65–99)
Glucose-Capillary: 150 mg/dL — ABNORMAL HIGH (ref 65–99)
Glucose-Capillary: 140 mg/dL — ABNORMAL HIGH (ref 65–99)

## 2017-01-26 LAB — RENAL FUNCTION PANEL
Albumin: 2.5 g/dL — ABNORMAL LOW (ref 3.5–5.0)
Anion gap: 9 (ref 5–15)
BUN: 50 mg/dL — ABNORMAL HIGH (ref 6–20)
CALCIUM: 6.8 mg/dL — AB (ref 8.9–10.3)
CHLORIDE: 102 mmol/L (ref 101–111)
CO2: 27 mmol/L (ref 22–32)
CREATININE: 3.78 mg/dL — AB (ref 0.44–1.00)
GFR calc Af Amer: 13 mL/min — ABNORMAL LOW (ref >60)
GFR, EST NON AFRICAN AMERICAN: 11 mL/min — AB (ref >60)
Glucose, Bld: 142 mg/dL — ABNORMAL HIGH (ref 65–99)
PHOSPHORUS: 4.2 mg/dL (ref 2.5–4.6)
Potassium: 3.7 mmol/L (ref 3.5–5.1)
SODIUM: 138 mmol/L (ref 135–145)

## 2017-01-26 LAB — CBC
HCT: 25.4 % — ABNORMAL LOW (ref 36.0–46.0)
Hemoglobin: 7.9 g/dL — ABNORMAL LOW (ref 12.0–15.0)
MCH: 28.8 pg (ref 26.0–34.0)
MCHC: 31.1 g/dL (ref 30.0–36.0)
MCV: 92.7 fL (ref 78.0–100.0)
PLATELETS: 141 10*3/uL — AB (ref 150–400)
RBC: 2.74 MIL/uL — ABNORMAL LOW (ref 3.87–5.11)
RDW: 17.3 % — AB (ref 11.5–15.5)
WBC: 6.4 10*3/uL (ref 4.0–10.5)

## 2017-01-26 MED ORDER — EPOETIN ALFA 10000 UNIT/ML IJ SOLN
10000.0000 [IU] | INTRAMUSCULAR | Status: DC
  Administered 2017-01-27: 10000 [IU] via INTRAVENOUS
  Filled 2017-01-26 (×2): qty 1

## 2017-01-26 NOTE — Progress Notes (Signed)
Kelli [name]  MRN: [id number]  DOB/AGE: 06/23/1950 66 y.o.  Primary Care Physician:[name], [name], MD  Admit date: 01/06/2017  Chief Complaint:  Chief Complaint  Patient presents with  . Shortness of Breath  . Dysuria  . Chest Pain    S-Pt presented on  01/06/2017 with  Chief Complaint  Patient presents with  . Shortness of Breath  . Dysuria  . Chest Pain  .    Pt offers no new complaints .    Pt says " I am tired of being in the hospital, its one thing after another"    Meds . atorvastatin  40 mg Oral q1800  . budesonide (PULMICORT) nebulizer solution  0.5 mg Nebulization BID  . calcitRIOL  0.25 mcg Oral Daily  . Chlorhexidine Gluconate Cloth  6 each Topical Daily  . epoetin (EPOGEN/PROCRIT) injection  10,000 Units Intravenous Q M,W,F-HD  . epoetin (EPOGEN/PROCRIT) injection  10,000 Units Subcutaneous Once  . feeding supplement  1 Container Oral TID BM  . hydrALAZINE  50 mg Oral TID  . insulin aspart  0-15 Units Subcutaneous TID WC  . insulin aspart  0-5 Units Subcutaneous QHS  . ipratropium-albuterol  3 mL Nebulization Q6H  . loratadine  10 mg Oral Daily  . mouth rinse  15 mL Mouth Rinse BID  . metoprolol tartrate  50 mg Oral BID  . pantoprazole  40 mg Oral BID AC  . sodium chloride flush  10-40 mL Intracatheter Q12H  . sodium chloride flush  10-40 mL Intracatheter Q12H      Physical Exam: Vital signs in last 24 hours: Temp:  [98 F (36.7 C)-99.8 F (37.7 C)] 98.3 F (36.8 C) (11/28 0747) Pulse Rate:  [72-109] 93 (11/28 0800) Resp:  [13-25] 15 (11/28 0800) BP: (81-180)/(42-87) 141/87 (11/28 0800) SpO2:  [93 %-100 %] 100 % (11/28 0802) Weight:  [180 lb 12.4 oz (82 kg)-190 lb 4.1 oz (86.3 kg)] 180 lb 12.4 oz (82 kg) (11/28 0500) Weight change: 10 lb 12.8 oz (4.9 kg) Last BM Date: 01/26/17  Intake/Output from previous day: 11/27 0701 - 11/28 0700 In: 480 [P.O.:480] Out: 2750 [Urine:750] No intake/output data recorded.   Physical Exam: General- pt is  awake ,alert, follows cooamnsd Resp- No acute REsp distress, Rhonchi+ CVS- S1S2 regular in rate and rhythm GIT- BS+, soft, NT, ND EXT- NO LE Edema, no Cyanosis Access-Permacath  Lab Results: CBC    Component Value Date/Time   WBC 6.4 01/26/2017 0613   RBC 2.74 (L) 01/26/2017 0613   HGB 7.9 (L) 01/26/2017 0613   HCT 25.4 (L) 01/26/2017 0613   PLT 141 (L) 01/26/2017 0613   MCV 92.7 01/26/2017 0613   MCH 28.8 01/26/2017 0613   MCHC 31.1 01/26/2017 0613   RDW 17.3 (H) 01/26/2017 0613   LYMPHSABS 1.2 10/13/2016 0352   MONOABS 0.8 10/13/2016 0352   EOSABS 0.3 10/13/2016 0352   BASOSABS 0.0 10/13/2016 0352       BMET Recent Labs    01/25/17 1533 01/26/17 0613  NA 138 138  K 3.2* 3.7  CL 102 102  CO2 28 27  GLUCOSE 214* 142*  BUN 28* 50*  CREATININE 2.48* 3.78*  CALCIUM 7.3* 6.8*   Creat trend 2018   5.0=>5.6=>5.86   In August admision4.5--5.0 2016   1.4--1.5 2015   1.3--1.7 2014   1.1--2.0 ( AKi)    Lab Results  Component Value Date   PTH 162 (H) 10/14/2016   PTH Comment 10/14/2016   CALCIUM  6.8 (L) 01/26/2017   CAION 1.14 04/08/2013   PHOS 4.2 01/26/2017     ANA Positive Anti dsDNA negative Complements normal ANCA negative M spike negative Free kappa/lamda chain ratio- 2.08  Alb  2.7 Corrected calcium  6.8+ 1.0=7.8  Impression: 1)Renal                 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  3)Anemia HGb not at goal (9--11) but better Pt has Received prbc. Hospital course marked with GI bleeding On Epo during HD  4)CKD Mineral-Bone Disorder PTH  elevated. Secondary Hyperparathyroidism- present.    On calcitriol Phosphorus now at  goal. Calcium now at goal  5)DM PMD following  6)Electrolytes  Hypokalemic   Hyponatremic   7)Acid base Co2 now at goal     Plan:  Will dialyze again in am  Will use 3k /3.5 ca bath Will try to remove 2 liters if pt tolerates     Elexia Friedt S 01/26/2017, 9:53 AM

## 2017-01-26 NOTE — Progress Notes (Signed)
Patient resting in bed. Vital signs are stable. IV patent. No complaints of any distress. Family visiting at the bedside. Report given to [facility], LPN. Patient transferred to room 327 via bed.

## 2017-01-26 NOTE — Progress Notes (Signed)
PROGRESS NOTE    Kelli [name]  KCL:[id number] DOB: 1951-01-22 DOA: 01/06/2017 PCP: Manon Hilding, MD    Brief Narrative:  66 year old female with a history of previous stroke and residual left-sided hemiparesis, diastolic heart failure, chronic kidney disease, diabetes, presented to the hospital with progressive shortness of breath and lower extremity edema.  Found to have worsening anemia with a hemoglobin of 7.6.  Chest x-ray indicated pulmonary edema.  She was started on IV Lasix for fluid overload, but renal function continued to worsen.  She underwent dialysis catheter placement and began hemodialysis.  Overall volume status has improved.  Regarding anemia, she is received several units of PRBCs and was seen by gastroenterology for GI bleeding.  She underwent EGD and colonoscopy.  Found to have gastritis/duodenitis.  Also developed rapid atrial fibrillation now on oral beta-blockers for rate control.  MRI of the brain done on admission indicated possible acute stroke.  Not a candidate for anticoagulation due to ongoing issues with bleeding/anemia.  Palliative care has seen the patient/family and they wish to continue with available treatments.  Plan is for skilled nursing facility on discharge once she is improved.   Assessment & Plan:   Principal Problem:   Acute metabolic encephalopathy Active Problems:   HTN (hypertension)   Mixed hyperlipidemia   Acute on chronic renal failure (HCC)   Elevated troponin   Acute on chronic diastolic CHF (congestive heart failure) (HCC)   Anemia   Diabetes mellitus type 2 in obese (HCC)   CKD (chronic kidney disease), stage IV (HCC)   Acute respiratory failure with hypoxia (HCC)   Precordial pain   Acute renal failure superimposed on stage 4 chronic kidney disease ([place])   Palliative care encounter   Goals of care, counseling/discussion   DNR (do not resuscitate) discussion   ESRD (end stage renal disease) ([place])   Gastritis and duodenitis  Pressure injury of skin   1. Chronic kidney disease stage IV, now progressed to end-stage renal disease.  Nephrology following.  She is undergoing regular dialysis.  Arrangements are being made for her to be set up with a dialysis after discharge.  Next dialysis session is scheduled for tomorrow.  She has a tunneled catheter in place. 2. Anemia.  Acute on chronic related to blood loss as well as renal disease.  She is received a total of 6 units of PRBCs during this hospitalization.  Hemoglobin has mildly declined since yesterday.  She does not have any signs of overt GI bleeding.  Will repeat labs in a.m.  If he continues to trend down, may need to transfuse further blood.  She has been started on Epogen per nephrology. 3. GI bleeding.  Found to have gastritis/duodenitis on EGD which was likely contributing to GI blood loss.  She is on PPI.  Colonoscopy prep was not adequate for study.  Avoid aspirin/NSAIDs. 4. Possible small acute lacunar infarct in the right caudate nucleus.  Patient has a prior history of stroke with residual left-sided hemiparesis.  Workup including carotid Dopplers did not show any significant stenosis bilaterally.  Echocardiogram was also unrevealing.  Ideally, she should be on anticoagulation due to her history of, but with ongoing issues with bleeding requiring transfusion of PRBCs, this is not appear to be a reasonable risk at this time.  This can be readdressed in the future.  Would hold off on antiplatelet agents for now in the setting of ongoing GI bleeding.  Family has been made aware of increased stroke risk  in this situation. 5. Atrial fibrillation with RVR.  Patient had an episode of rapid atrial fibrillation on 11/24.  Briefly required Cardizem infusion, now transitioned off and is on oral metoprolol.  TSH is normal.  Not a candidate for anticoagulation due to ongoing bleeding issues. 6. Acute on chronic diastolic congestive heart failure.  Overall volume status has  improved with dialysis.  Continue to manage volume status/fluid removal with dialysis.  Continue on beta-blocker 7. Diabetes.  A1c 5.7.  Continue on sliding scale insulin.  Blood sugars are stable.   DVT prophylaxis: SCDs Code Status: Full code Family Communication: Discussed with husband at the bedside Disposition Plan: Skilled nursing facility placement on discharge   Consultants:   Nephrology  Cardiology  Interventional radiology  Palliative care  Procedures:  11/10 echocardiogram: - Left ventricle: The cavity size was normal. Systolic function was   normal. The estimated ejection fraction was in the range of 55%   to 60%. Wall motion was normal; there were no regional wall   motion abnormalities. The study is not technically sufficient to   allow evaluation of LV diastolic function due to tachycardia and   E/a fusion. Moderate to severe left ventricular hypertrophy. - Aortic valve: Trileaflet; mildly thickened, mildly calcified   leaflets. Sclerosis without stenosis. There was no stenosis. - Mitral valve: Calcified annulus. - Left atrium: The atrium was mildly dilated. - Atrial septum: No defect or patent foramen ovale was identified.  11/13 placement of temporary dialysis catheter by general surgery  11/16 tunneled dialysis catheter placement by interventional radiology  11/24 colonoscopy:- Preparation of the colon was poor.                           - Stool in the rectum and in the recto-sigmoid                            colon  11/24 EGD:- Normal esophagus.                           - Medium-sized hiatal hernia.                           - ANEMIA PARTIALLY DUE TO MODERATE TO SEVERE                            Gastritis & Duodenitis.   Antimicrobials:      Subjective: Had a bowel movement last night.  Unaware if it contained any blood.  No shortness of breath.  Objective: Vitals:   01/26/17 0800 01/26/17 0802 01/26/17 0900 01/26/17 1000  BP: (!) 141/87   105/60 (!) 94/55  Pulse: 93  95 92  Resp: 15  17 18   Temp:      TempSrc:      SpO2: 94% 100% 100% 98%  Weight:      Height:        Intake/Output Summary (Last 24 hours) at 01/26/2017 1137 Last data filed at 01/25/2017 1553 Gross per 24 hour  Intake 480 ml  Output 2750 ml  Net -2270 ml   Filed Weights   01/25/17 1130 01/25/17 1441 01/26/17 0500  Weight: 86.3 kg (190 lb 4.1 oz) 85.9 kg (189 lb 6 oz) 82 kg (180 lb 12.4 oz)    Examination:  General exam: Appears calm and comfortable  Respiratory system: Bilateral rhonchi. Respiratory effort normal. Cardiovascular system: S1 & S2 heard, irregular. No JVD, murmurs, rubs, gallops or clicks. No pedal edema. Gastrointestinal system: Abdomen is nondistended, soft and nontender. No organomegaly or masses felt. Normal bowel sounds heard. Central nervous system: Alert and oriented. No focal neurological deficits. Extremities: Symmetric 5 x 5 power. Skin: No rashes, lesions or ulcers Psychiatry: Judgement and insight appear normal. Mood & affect appropriate.     Data Reviewed: I have personally reviewed following labs and imaging studies  CBC: Recent Labs  Lab 01/23/17 0444 01/24/17 0438 01/25/17 0555 01/25/17 1533 01/26/17 0613  WBC 8.1 8.0 7.1 6.6 6.4  HGB 8.2* 8.9* 8.7* 8.7* 7.9*  HCT 26.3* 28.4* 27.8* 27.4* 25.4*  MCV 92.3 93.1 93.3 91.9 92.7  PLT 152 149* 147* 138* 779*   Basic Metabolic Panel: Recent Labs  Lab 01/21/17 0451 01/22/17 0443 01/23/17 0444 01/24/17 0438 01/25/17 0555 01/25/17 1533 01/26/17 0613  NA 133* 137 137 137 139 138 138  K 3.8 3.6 3.5 4.0 4.0 3.2* 3.7  CL 97* 99* 104 102 105 102 102  CO2 28 28 26 25 23 28 27   GLUCOSE 122* 162* 153* 149* 142* 214* 142*  BUN 39* 26* 40* 60* 68* 28* 50*  CREATININE 3.45* 2.81* 4.01* 5.22* 5.00* 2.48* 3.78*  CALCIUM 6.8* 7.7* 7.1* 7.0* 7.0* 7.3* 6.8*  PHOS 2.5 2.5  --   --  4.6 2.6 4.2   GFR: Estimated Creatinine Clearance: 15.5 mL/min (A) (by C-G formula  based on SCr of 3.78 mg/dL (H)). Liver Function Tests: Recent Labs  Lab 01/21/17 0451 01/22/17 0443 01/25/17 0555 01/25/17 1533 01/26/17 0613  ALBUMIN 2.7* 3.0* 2.7* 2.7* 2.5*   No results for input(s): LIPASE, AMYLASE in the last 168 hours. Recent Labs  Lab 01/19/17 1339  AMMONIA 14   Coagulation Profile: No results for input(s): INR, PROTIME in the last 168 hours. Cardiac Enzymes: No results for input(s): CKTOTAL, CKMB, CKMBINDEX, TROPONINI in the last 168 hours. BNP (last 3 results) No results for input(s): PROBNP in the last 8760 hours. HbA1C: No results for input(s): HGBA1C in the last 72 hours. CBG: Recent Labs  Lab 01/25/17 0755 01/25/17 1131 01/25/17 1622 01/25/17 2125 01/26/17 0747  GLUCAP 137* 183* 202* 141* 131*   Lipid Profile: No results for input(s): CHOL, HDL, LDLCALC, TRIG, CHOLHDL, LDLDIRECT in the last 72 hours. Thyroid Function Tests: No results for input(s): TSH, T4TOTAL, FREET4, T3FREE, THYROIDAB in the last 72 hours. Anemia Panel: No results for input(s): VITAMINB12, FOLATE, FERRITIN, TIBC, IRON, RETICCTPCT in the last 72 hours. Sepsis Labs: No results for input(s): PROCALCITON, LATICACIDVEN in the last 168 hours.  Recent Results (from the past 240 hour(s))  MRSA PCR Screening     Status: None   Collection Time: 01/22/17  7:21 PM  Result Value Ref Range Status   MRSA by PCR NEGATIVE NEGATIVE Final    Comment:        The GeneXpert MRSA Assay (FDA approved for NASAL specimens only), is one component of a comprehensive MRSA colonization surveillance program. It is not intended to diagnose MRSA infection nor to guide or monitor treatment for MRSA infections.          Radiology Studies: No results found.      Scheduled Meds: . atorvastatin  40 mg Oral q1800  . budesonide (PULMICORT) nebulizer solution  0.5 mg Nebulization BID  . calcitRIOL  0.25 mcg Oral Daily  .  Chlorhexidine Gluconate Cloth  6 each Topical Daily  .  epoetin (EPOGEN/PROCRIT) injection  10,000 Units Intravenous Q M,W,F-HD  . epoetin (EPOGEN/PROCRIT) injection  10,000 Units Subcutaneous Once  . feeding supplement  1 Container Oral TID BM  . hydrALAZINE  50 mg Oral TID  . insulin aspart  0-15 Units Subcutaneous TID WC  . insulin aspart  0-5 Units Subcutaneous QHS  . ipratropium-albuterol  3 mL Nebulization Q6H  . loratadine  10 mg Oral Daily  . mouth rinse  15 mL Mouth Rinse BID  . metoprolol tartrate  50 mg Oral BID  . pantoprazole  40 mg Oral BID AC  . sodium chloride flush  10-40 mL Intracatheter Q12H  . sodium chloride flush  10-40 mL Intracatheter Q12H   Continuous Infusions: . sodium chloride    . sodium chloride    . sodium chloride    . diltiazem (CARDIZEM) infusion       LOS: 20 days    Time spent: 18mins    Kathie Dike, MD [facility] Pager [phone]  If 7PM-7AM, please contact night-coverage [email or web address] Password [facility] 01/26/2017, 11:37 AM

## 2017-01-26 NOTE — Progress Notes (Signed)
Pt was not at home. I am mailing a letter with results.

## 2017-01-27 ENCOUNTER — Other Ambulatory Visit: Payer: Self-pay

## 2017-01-27 ENCOUNTER — Emergency Department (HOSPITAL_COMMUNITY): Payer: 59

## 2017-01-27 ENCOUNTER — Encounter (HOSPITAL_COMMUNITY): Payer: Self-pay | Admitting: *Deleted

## 2017-01-27 ENCOUNTER — Observation Stay (HOSPITAL_COMMUNITY)
Admission: EM | Admit: 2017-01-27 | Discharge: 2017-01-30 | Disposition: A | Discharge status: Home / Self Care | Payer: 59 | Attending: Internal Medicine | Admitting: Internal Medicine

## 2017-01-27 DIAGNOSIS — Z8673 Personal history of transient ischemic attack (TIA), and cerebral infarction without residual deficits: Secondary | ICD-10-CM

## 2017-01-27 DIAGNOSIS — R2681 Unsteadiness on feet: Secondary | ICD-10-CM | POA: Insufficient documentation

## 2017-01-27 DIAGNOSIS — H538 Other visual disturbances: Secondary | ICD-10-CM | POA: Diagnosis not present

## 2017-01-27 DIAGNOSIS — E1122 Type 2 diabetes mellitus with diabetic chronic kidney disease: Secondary | ICD-10-CM | POA: Diagnosis not present

## 2017-01-27 DIAGNOSIS — I10 Essential (primary) hypertension: Secondary | ICD-10-CM | POA: Diagnosis present

## 2017-01-27 DIAGNOSIS — I509 Heart failure, unspecified: Secondary | ICD-10-CM

## 2017-01-27 DIAGNOSIS — I132 Hypertensive heart and chronic kidney disease with heart failure and with stage 5 chronic kidney disease, or end stage renal disease: Secondary | ICD-10-CM | POA: Diagnosis not present

## 2017-01-27 DIAGNOSIS — N186 End stage renal disease: Secondary | ICD-10-CM | POA: Diagnosis not present

## 2017-01-27 DIAGNOSIS — D649 Anemia, unspecified: Secondary | ICD-10-CM | POA: Diagnosis present

## 2017-01-27 DIAGNOSIS — R778 Other specified abnormalities of plasma proteins: Secondary | ICD-10-CM | POA: Diagnosis present

## 2017-01-27 DIAGNOSIS — Z794 Long term (current) use of insulin: Secondary | ICD-10-CM | POA: Diagnosis not present

## 2017-01-27 DIAGNOSIS — I5032 Chronic diastolic (congestive) heart failure: Secondary | ICD-10-CM | POA: Diagnosis present

## 2017-01-27 DIAGNOSIS — R195 Other fecal abnormalities: Secondary | ICD-10-CM | POA: Diagnosis present

## 2017-01-27 DIAGNOSIS — G459 Transient cerebral ischemic attack, unspecified: Secondary | ICD-10-CM | POA: Diagnosis not present

## 2017-01-27 DIAGNOSIS — E1169 Type 2 diabetes mellitus with other specified complication: Secondary | ICD-10-CM | POA: Diagnosis present

## 2017-01-27 DIAGNOSIS — R7989 Other specified abnormal findings of blood chemistry: Secondary | ICD-10-CM | POA: Diagnosis present

## 2017-01-27 DIAGNOSIS — Z950 Presence of cardiac pacemaker: Secondary | ICD-10-CM | POA: Insufficient documentation

## 2017-01-27 DIAGNOSIS — K2990 Gastroduodenitis, unspecified, without bleeding: Secondary | ICD-10-CM

## 2017-01-27 DIAGNOSIS — Z992 Dependence on renal dialysis: Secondary | ICD-10-CM | POA: Insufficient documentation

## 2017-01-27 DIAGNOSIS — Z87891 Personal history of nicotine dependence: Secondary | ICD-10-CM | POA: Insufficient documentation

## 2017-01-27 DIAGNOSIS — I5033 Acute on chronic diastolic (congestive) heart failure: Secondary | ICD-10-CM | POA: Diagnosis present

## 2017-01-27 DIAGNOSIS — E669 Obesity, unspecified: Secondary | ICD-10-CM

## 2017-01-27 DIAGNOSIS — Z79899 Other long term (current) drug therapy: Secondary | ICD-10-CM | POA: Insufficient documentation

## 2017-01-27 LAB — CBC WITH DIFFERENTIAL/PLATELET
BASOS PCT: 1 %
Basophils Absolute: 0 10*3/uL (ref 0.0–0.1)
EOS ABS: 0.1 10*3/uL (ref 0.0–0.7)
Eosinophils Relative: 2 %
HCT: 27.6 % — ABNORMAL LOW (ref 36.0–46.0)
HEMOGLOBIN: 8.7 g/dL — AB (ref 12.0–15.0)
LYMPHS ABS: 1.1 10*3/uL (ref 0.7–4.0)
Lymphocytes Relative: 17 %
MCH: 29.4 pg (ref 26.0–34.0)
MCHC: 31.5 g/dL (ref 30.0–36.0)
MCV: 93.2 fL (ref 78.0–100.0)
Monocytes Absolute: 0.7 10*3/uL (ref 0.1–1.0)
Monocytes Relative: 10 %
NEUTROS ABS: 4.8 10*3/uL (ref 1.7–7.7)
NEUTROS PCT: 70 %
Platelets: 179 10*3/uL (ref 150–400)
RBC: 2.96 MIL/uL — AB (ref 3.87–5.11)
RDW: 17.4 % — ABNORMAL HIGH (ref 11.5–15.5)
WBC: 6.8 10*3/uL (ref 4.0–10.5)

## 2017-01-27 LAB — BASIC METABOLIC PANEL
Anion gap: 11 (ref 5–15)
BUN: 63 mg/dL — ABNORMAL HIGH (ref 6–20)
CHLORIDE: 101 mmol/L (ref 101–111)
CO2: 25 mmol/L (ref 22–32)
CREATININE: 4.55 mg/dL — AB (ref 0.44–1.00)
Calcium: 6.7 mg/dL — ABNORMAL LOW (ref 8.9–10.3)
GFR calc non Af Amer: 9 mL/min — ABNORMAL LOW (ref >60)
GFR, EST AFRICAN AMERICAN: 11 mL/min — AB (ref >60)
Glucose, Bld: 129 mg/dL — ABNORMAL HIGH (ref 65–99)
POTASSIUM: 4.1 mmol/L (ref 3.5–5.1)
Sodium: 137 mmol/L (ref 135–145)

## 2017-01-27 LAB — CBC
HEMATOCRIT: 25.8 % — AB (ref 36.0–46.0)
Hemoglobin: 8 g/dL — ABNORMAL LOW (ref 12.0–15.0)
MCH: 28.8 pg (ref 26.0–34.0)
MCHC: 31 g/dL (ref 30.0–36.0)
MCV: 92.8 fL (ref 78.0–100.0)
PLATELETS: 159 10*3/uL (ref 150–400)
RBC: 2.78 MIL/uL — AB (ref 3.87–5.11)
RDW: 17.2 % — ABNORMAL HIGH (ref 11.5–15.5)
WBC: 7.5 10*3/uL (ref 4.0–10.5)

## 2017-01-27 LAB — GLUCOSE, CAPILLARY
Glucose-Capillary: 116 mg/dL — ABNORMAL HIGH (ref 65–99)
Glucose-Capillary: 118 mg/dL — ABNORMAL HIGH (ref 65–99)
Glucose-Capillary: 169 mg/dL — ABNORMAL HIGH (ref 65–99)

## 2017-01-27 MED ORDER — METOPROLOL TARTRATE 50 MG PO TABS
50.0000 mg | ORAL_TABLET | Freq: Two times a day (BID) | ORAL | Status: DC
Start: 2017-01-27 — End: 2017-04-21

## 2017-01-27 MED ORDER — EPOETIN ALFA 10000 UNIT/ML IJ SOLN
INTRAMUSCULAR | Status: AC
  Filled 2017-01-27: qty 1

## 2017-01-27 MED ORDER — EPOETIN ALFA 10000 UNIT/ML IJ SOLN: 10000.0000 [IU] | INTRAMUSCULAR | Status: DC

## 2017-01-27 MED ORDER — BOOST / RESOURCE BREEZE PO LIQD: 1.0000 | Freq: Three times a day (TID) | ORAL | 0 refills | Status: DC

## 2017-01-27 MED ORDER — BUDESONIDE 0.5 MG/2ML IN SUSP: 0.5000 mg | Freq: Two times a day (BID) | RESPIRATORY_TRACT | 12 refills | Status: DC

## 2017-01-27 MED ORDER — PANTOPRAZOLE SODIUM 40 MG PO TBEC: 40.0000 mg | DELAYED_RELEASE_TABLET | Freq: Two times a day (BID) | ORAL | Status: DC

## 2017-01-27 MED ORDER — GUAIFENESIN-DM 100-10 MG/5ML PO SYRP: 5.0000 mL | ORAL_SOLUTION | ORAL | 0 refills | Status: DC | PRN

## 2017-01-27 NOTE — Care Management Important Message (Signed)
Important Message  Patient Details  Name: Kelli [name] MRN: [id number] Date of Birth: 1950/04/27   Medicare Important Message Given:  Yes    Sherald Barge, RN 01/27/2017, 2:19 PM

## 2017-01-27 NOTE — Clinical Social Work Note (Signed)
LCSW notified [facility] of patient's discharge today. LCSW was advised that patient would be serviced on 01/29/17 @ 11:15.   Nicole Defino, Clydene Pugh, LCSW

## 2017-01-27 NOTE — Progress Notes (Signed)
Patient being discharged to [facility] facility. Report called,and given to Katherine Basset LPN. Vital signs stable. [facility]  to transport patient to awaiting facility.

## 2017-01-27 NOTE — Care Management Note (Signed)
Case Management Note  Patient Details  Name: DEVEN AUDI MRN: [id number] Date of Birth: 24-Apr-1950  If discussed at Long Length of Stay Meetings, dates discussed:  01/27/2017  Additional Comments:  Sherald Barge, RN 01/27/2017, 2:55 PM

## 2017-01-27 NOTE — Progress Notes (Signed)
Late entry  Pt experienced very brief episode tachycardia while being cleaned up by tech 11/28, PM. Pt asymptomatic, MD aware, quickly returned to NSR and has remained in NSR throughout remainder of shift.

## 2017-01-27 NOTE — Clinical Social Work Placement (Signed)
   CLINICAL SOCIAL WORK PLACEMENT  NOTE  Date:  01/27/2017  Patient Details  Name: Kelli [name] MRN: [id number] Date of Birth: 06/06/1950  Clinical Social Work is seeking post-discharge placement for this patient at the Day level of care (*CSW will initial, date and re-position this form in  chart as items are completed):  Yes   Patient/family provided with [place] Work Department's list of facilities offering this level of care within the geographic area requested by the patient (or if unable, by the patient's family).  Yes   Patient/family informed of their freedom to choose among providers that offer the needed level of care, that participate in Medicare, Medicaid or managed care program needed by the patient, have an available bed and are willing to accept the patient.  Yes   Patient/family informed of [facility]'s ownership interest in [facility] and [facility], as well as of the fact that they are under no obligation to receive care at these facilities.  PASRR submitted to EDS on       PASRR number received on       Existing PASRR number confirmed on 01/21/17     FL2 transmitted to all facilities in geographic area requested by pt/family on 01/21/17     FL2 transmitted to all facilities within larger geographic area on       Patient informed that his/her managed care company has contracts with or will negotiate with certain facilities, including the following:        Yes   Patient/family informed of bed offers received.  Patient chooses bed at [facility]     Physician recommends and patient chooses bed at      Patient to be transferred to [facility] [place] on 01/27/17.  Patient to be transferred to facility by [facility]     Patient family notified on 01/27/17 of transfer.  Name of family member notified:  spouse (voicemail messge left)     PHYSICIAN       Additional Comment:  Discharge clinicals sent.   Facility notified.  LCSW signing off.  _______________________________________________ Ihor Gully, LCSW 01/27/2017, 3:54 PM

## 2017-01-27 NOTE — Progress Notes (Signed)
Patient received dialysis today. No complications. 4 hour treatment using her dialysis catheter. Goal of 3000cc removed. Patient tolerated well.

## 2017-01-27 NOTE — Progress Notes (Signed)
Subjective:  dialysis in progress. Patient states she was not taking ASA at home. No abd pain. No melena, brbpr.    Objective: Vital signs in last 24 hours: Temp:  [97.6 F (36.4 C)-98.6 F (37 C)] 98.2 F (36.8 C) (11/29 0604) Pulse Rate:  [89-101] 89 (11/29 0604) Resp:  [15-24] 19 (11/29 0604) BP: (94-141)/(53-87) 138/66 (11/29 0604) SpO2:  [92 %-100 %] 92 % (11/29 0604) Weight:  [192 lb 3.9 oz (87.2 kg)] 192 lb 3.9 oz (87.2 kg) (11/29 0604) Last BM Date: 01/26/17 General:   Alert,  Well-developed, well-nourished, pleasant and cooperative in NAD Head:  Normocephalic and atraumatic. Eyes:  Sclera clear, no icterus.  Abdomen:  Soft, nondistended.    Neurologic:  Alert and  oriented x4;    Skin:  Intact without significant lesions or rashes. Psych:  Alert and cooperative. Normal mood and affect.  Intake/Output from previous day: 11/28 0701 - 11/29 0700 In: 240 [P.O.:240] Out: 200 [Urine:200] Intake/Output this shift: No intake/output data recorded.  Lab Results: CBC Recent Labs    01/25/17 1533 01/26/17 0613 01/27/17 0452  WBC 6.6 6.4 7.5  HGB 8.7* 7.9* 8.0*  HCT 27.4* 25.4* 25.8*  MCV 91.9 92.7 92.8  PLT 138* 141* 159   BMET Recent Labs    01/25/17 1533 01/26/17 0613 01/27/17 0452  NA 138 138 137  K 3.2* 3.7 4.1  CL 102 102 101  CO2 _0 GLUCOSE 214* 142* 129*  BUN 28* 50* 63*  CREATININE 2.48* 3.78* 4.55*  CALCIUM 7.3* 6.8* 6.7*   LFTs Recent Labs    01/25/17 0555 01/25/17 1533 01/26/17 0613  ALBUMIN 2.7* 2.7* 2.5*   No results for input(s): LIPASE in the last 72 hours. PT/INR No results for input(s): LABPROT, INR in the last 72 hours.    Imaging Studies: Ct Abdomen Pelvis Wo Contrast  Result Date: 01/07/2017 CLINICAL DATA:  Abdominal pain. Decreased hemoglobin. Clinical suspicion for retroperitoneal hemorrhage. EXAM: CT ABDOMEN AND PELVIS WITHOUT CONTRAST TECHNIQUE: Multidetector CT imaging of the abdomen and pelvis was performed  following the standard protocol without IV contrast. COMPARISON:  None. FINDINGS: Lower chest: Bibasilar heterogeneous airspace disease and small bilateral pleural effusions. Hepatobiliary: No mass visualized on this unenhanced exam. Gallbladder is unremarkable. Pancreas: No mass or inflammatory process visualized on this unenhanced exam. Spleen:  Within normal limits in size. Adrenals/Urinary tract: Bilateral renal parenchymal atrophy, left side greater than right, consistent with history of chronic kidney disease. Renal vascular calcification noted. Several tiny calculi are seen within the left renal collecting system and proximal left ureter, without hydronephrosis. Unremarkable unopacified urinary bladder. Stomach/Bowel: No evidence of obstruction, inflammatory process, or abnormal fluid collections. Normal appendix visualized. Mild left-sided colonic diverticulosis seen, without evidence of diverticulitis. Vascular/Lymphatic: No pathologically enlarged lymph nodes identified. Infrarenal abdominal aortic aneurysm measuring 3.4 x 3.3 cm. Reproductive: Prior hysterectomy noted. Adnexal regions are unremarkable in appearance. Other: No evidence of retroperitoneal hemorrhage or hemoperitoneum. Mild edema in subcutaneous tissues. Musculoskeletal:  No suspicious bone lesions identified. IMPRESSION: No evidence of retroperitoneal hemorrhage or hemoperitoneum. Several tiny calculi in left renal collecting system and proximal left ureter, without hydronephrosis. Bilateral renal parenchymal atrophy, left side greater than right. Colonic diverticulosis, without radiographic evidence of diverticulitis. Small bilateral pleural effusions and bilateral lower lobe airspace disease. 3.4 cm infrarenal abdominal aortic aneurysm. Recommend followup by ultrasound in 3 years. This recommendation follows ACR consensus guidelines: White Paper of the ACR Incidental Findings Committee II on Vascular Findings. J Am Coll  Radiol 2013;  [id number] Electronically Signed   By: Earle Gell M.D.   On: 01/07/2017 10:22   Dg Chest 2 View  Result Date: 01/06/2017 CLINICAL DATA:  Trouble breathing x1 day with tightness and congestion in chest. Weakness. Hx of diabetes, HTN-controlled with medication, and low hemoglobin. 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 EXAM: CHEST  2 VIEW COMPARISON:  10/19/2016 FINDINGS: The heart is enlarged. There is pulmonary vascular congestion. Prominent interstitial markings are consistent with mild edema. Trace bilateral pleural effusions are also suspected. There is mild bibasilar atelectasis. IMPRESSION: Cardiomegaly and mild pulmonary edema. Electronically Signed   By: Nolon Nations M.D.   On: 01/06/2017 16:56   Ct Head Wo Contrast  Result Date: 01/19/2017 CLINICAL DATA:  Unexplained altered level of consciousness. EXAM: CT HEAD WITHOUT CONTRAST TECHNIQUE: Contiguous axial images were obtained from the base of the skull through the vertex without intravenous contrast. COMPARISON:  Brain MRI 09/17/2013 FINDINGS: Brain: Confluent low-density in the cerebral white matter consistent with chronic small vessel ischemia. Remote moderate to large right PCA distribution affecting the inferior temporal and occipital lobes. Small remote high right posterior frontal and left parietal cortex infarcts. Patchy indistinct bilateral deep gray nuclei consistent with chronic small vessel ischemic injury. Remote right pontine infarct. Remote small vessel left cerebellar infarct. The left thalamus is asymmetrically low-density compared to the right common mildly expanded. This could reflect a recent infarct. No hemorrhage, hydrocephalus, or shift. Vascular: Atherosclerotic calcification. Small right ICA at the skullbase. There was right ICA occlusion on prior exam. No hyperdense vessel Skull: No acute or aggressive finding. Sinuses/Orbits: No acute finding IMPRESSION: 1. Compared to 2015 there is patchy low-density  in the left thalamus with mild expansion. This may reflect a recent infarct if there is associated deficit. MRI could characterize if clinically appropriate. 2. Advanced chronic ischemic injury as noted above. 3. 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 Electronically Signed   By: Monte Fantasia M.D.   On: 01/19/2017 15:50   Mr Brain Wo Contrast  Result Date: 01/21/2017 CLINICAL DATA:  66 year old female with altered mental status for 2 days. Abnormal density in the left thalamus new compared to 2015 on recent head CT. EXAM: MRI HEAD WITHOUT CONTRAST TECHNIQUE: Multiplanar, multiecho pulse sequences of the brain and surrounding structures were obtained without intravenous contrast. COMPARISON:  Head CT without contrast 01/19/2017. Brain MRI 09/17/2013. FINDINGS: Brain: The only possible focus of acutely restricted diffusion is a small area in the right caudate nucleus on series 3, image 87. Mild T2 and FLAIR hyperintensity with no hemorrhage or mass effect. Severe chronic ischemic disease. Cortical encephalomalacia in the right greater than left MCA and right PCA territories with advanced Wallerian degeneration in the right brainstem. Progressed since 2015 and confluent abnormal signal in the left deep gray matter nuclei, but mostly facilitated on diffusion indicating subacute to chronic time course. Progressed chronic small vessel ischemia in the left cerebellum since 2015. Progressed patchy and confluent left MCA territory white matter T2 and FLAIR hyperintensity. Scattered post ischemic hemosiderin and occasional other chronic micro hemorrhages. No midline shift, mass effect, evidence of mass lesion, ventriculomegaly, extra-axial collection or acute intracranial hemorrhage. Cervicomedullary junction and pituitary are within normal limits. 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 Other Major intracranial vascular flow voids are stable. The distal right  vertebral artery is dominant. Skull and upper cervical spine: Negative. Visualized bone marrow signal is within normal limits. Sinuses/Orbits:  Stable orbits soft tissues. Mild paranasal sinus mucosal thickening has not significantly changed. Other: Mild left mastoid effusion is new but significance is doubtful. Small volume retained secretions in the left nasopharynx. Right mastoids are clear. Scalp and face soft tissues appear negative. IMPRESSION: 1. Possible small acute lacunar infarct in the right caudate nucleus. No associated hemorrhage or mass effect. 2. No other acute ischemia identified, but very severe chronic ischemic disease. Progression in the left deep gray matter nuclei and the left cerebellum since 2015. Right greater than left chronic MCA and right PCA territory encephalomalacia with advanced Wallerian degeneration in the right brainstem. 3. Chronic right ICA occlusion. Electronically Signed   By: Genevie Ann M.D.   On: 01/21/2017 07:56   US Carotid Bilateral  Result Date: 01/22/2017 CLINICAL DATA:  CVA. 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 Status post left carotid endarterectomy in 2014. EXAM: BILATERAL CAROTID DUPLEX ULTRASOUND TECHNIQUE: Pearline Cables scale imaging, color Doppler and duplex ultrasound were performed of bilateral carotid and vertebral arteries in the neck. COMPARISON:  Brain MRI 01/21/2017. Carotid duplex report from 06/11/2014 FINDINGS: Criteria: Quantification of carotid stenosis is based on velocity parameters that correlate the residual internal carotid diameter with NASCET-based stenosis levels, using the diameter of the distal internal carotid lumen as the denominator for stenosis measurement. The following velocity measurements were obtained: RIGHT ICA:  Occluded CCA:  94 cm/sec ECA:  234 cm/sec LEFT ICA:  165 cm/sec CCA:  672 cm/sec SYSTOLIC ICA/CCA RATIO:  1.2 DIASTOLIC ICA/CCA RATIO:  1.8 ECA:  176 cm/sec RIGHT CAROTID ARTERY:  Heterogeneous plaque in the proximal common carotid artery. Irregular echogenic plaque at the right carotid bulb. Occlusion of the internal carotid artery near the origin. Right external carotid artery is patent. RIGHT VERTEBRAL ARTERY: Antegrade flow and normal waveform in the right vertebral artery. LEFT CAROTID ARTERY: Small amount of plaque in left common carotid artery. Left carotid bulb is patulous and compatible with previous endarterectomy. External carotid artery is patent with normal waveform. Small amount of plaque in the left internal carotid artery. Mild tortuosity in the distal internal carotid artery with a slightly elevated peak systolic velocity measuring up to 165 cm/sec. LEFT VERTEBRAL ARTERY: Retrograde flow in the left vertebral artery. This finding appears chronic based on the previous ultrasound report. IMPRESSION: Chronic occlusion of the right internal carotid artery. Surgical changes compatible with a left carotid endarterectomy. Mild plaque and tortuosity in the left internal carotid artery. Estimated degree of stenosis in the left internal carotid artery is less than 50%. Retrograde flow in the left vertebral artery. 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 This appears to be a chronic finding. Antegrade flow in the right vertebral artery. Electronically Signed   By: Markus Daft M.D.   On: 01/22/2017 08:59   US Venous Img Lower Bilateral  Result Date: 01/19/2017 CLINICAL DATA:  Bilateral lower extremity swelling. EXAM: BILATERAL LOWER EXTREMITY VENOUS DOPPLER ULTRASOUND TECHNIQUE: Gray-scale sonography with graded compression, as well as color Doppler and duplex ultrasound were performed to evaluate the lower extremity deep venous systems from the level of the common femoral vein and including the common femoral, femoral, profunda femoral, popliteal and calf veins including the posterior tibial, peroneal and gastrocnemius veins when  visible. The superficial great saphenous vein was also interrogated. Spectral Doppler was utilized to evaluate flow at rest and with distal augmentation maneuvers in the common femoral, femoral and popliteal veins. COMPARISON:  10/13/2016 FINDINGS:  RIGHT LOWER EXTREMITY Common Femoral Vein: No evidence of thrombus. Normal compressibility, respiratory phasicity and response to augmentation. Saphenofemoral Junction: No evidence of thrombus. Normal compressibility and flow on color Doppler imaging. Profunda Femoral Vein: No evidence of thrombus. Normal compressibility and flow on color Doppler imaging. Femoral Vein: No evidence of thrombus. Normal compressibility, respiratory phasicity and response to augmentation. Popliteal Vein: No evidence of thrombus. Normal compressibility, respiratory phasicity and response to augmentation. Calf Veins: No evidence of thrombus. Normal compressibility and flow on color Doppler imaging. Superficial Great Saphenous Vein: No evidence of thrombus. Normal compressibility. Venous Reflux:  None. Other Findings:  None. LEFT LOWER EXTREMITY Common Femoral Vein: No evidence of thrombus. Normal compressibility, respiratory phasicity and response to augmentation. Saphenofemoral Junction: No evidence of thrombus. Normal compressibility and flow on color Doppler imaging. Profunda Femoral Vein: No evidence of thrombus. Normal compressibility and flow on color Doppler imaging. Femoral Vein: No evidence of thrombus. Normal compressibility, respiratory phasicity and response to augmentation. Popliteal Vein: No evidence of thrombus. Normal compressibility, respiratory phasicity and response to augmentation. Calf Veins: No evidence of thrombus. Normal compressibility and flow on color Doppler imaging. Superficial Great Saphenous Vein: No evidence of thrombus. Normal compressibility. Venous Reflux:  None. Other Findings:  None. IMPRESSION: No evidence of deep venous thrombosis. Electronically Signed    By: Rolm Baptise M.D.   On: 01/19/2017 15:35   Ir Fluoro Guide Cv Line Left  Result Date: 01/14/2017 INDICATION: End-stage renal disease, in need of durable intravenous access for continuation of dialysis. EXAM: TUNNELED CENTRAL VENOUS HEMODIALYSIS CATHETER PLACEMENT WITH ULTRASOUND AND FLUOROSCOPIC GUIDANCE MEDICATIONS: Ancef 2 gm IV . The antibiotic was given in an appropriate time interval prior to skin puncture. ANESTHESIA/SEDATION: Versed 2 mg IV; Fentanyl 50 mcg IV; Moderate Sedation Time:  16 minutes The patient was continuously monitored during the procedure by the interventional radiology nurse under my direct supervision. FLUOROSCOPY TIME:  Fluoroscopy Time: 36 seconds (5 mGy). COMPLICATIONS: None immediate. PROCEDURE: Informed written consent was obtained from the patient after a discussion of the risks, benefits, and alternatives to treatment. Questions regarding the procedure were encouraged and answered. The left neck and chest were prepped with chlorhexidine in a sterile fashion, and a sterile drape was applied covering the operative field. Maximum barrier sterile technique with sterile gowns and gloves were used for the procedure. A timeout was performed prior to the initiation of the procedure. After creating a small venotomy incision, a micropuncture kit was utilized to access the internal jugular vein. Real-time ultrasound guidance was utilized for vascular access including the acquisition of a permanent ultrasound image documenting patency of the accessed vessel. The microwire was utilized to measure appropriate catheter length. A stiff Glidewire was advanced to the level of the IVC and the micropuncture sheath was exchanged for a peel-away sheath. A palindrome tunneled hemodialysis catheter measuring 23 cm from tip to cuff was tunneled in a retrograde fashion from the anterior chest wall to the venotomy incision. The catheter was then placed through the peel-away sheath with tips ultimately  positioned within the superior aspect of the right atrium. Final catheter positioning was confirmed and documented with a spot radiographic image. The catheter aspirates and flushes normally. The catheter was flushed with appropriate volume heparin dwells. The catheter exit site was secured with a 0-Prolene retention suture. The venotomy incision was closed with an interrupted 4-0 Vicryl, Dermabond and Steri-strips. Dressings were applied. The patient tolerated the procedure well without immediate post procedural complication. IMPRESSION: Successful placement of  23 cm tip to cuff tunneled hemodialysis catheter via the left internal jugular vein with tips terminating within the superior aspect of the right atrium. The catheter is ready for immediate use. Electronically Signed   By: Sandi Mariscal M.D.   On: 01/14/2017 15:28   Ir US Guide Vasc Access Left  Result Date: 01/14/2017 INDICATION: End-stage renal disease, in need of durable intravenous access for continuation of dialysis. EXAM: TUNNELED CENTRAL VENOUS HEMODIALYSIS CATHETER PLACEMENT WITH ULTRASOUND AND FLUOROSCOPIC GUIDANCE MEDICATIONS: Ancef 2 gm IV . The antibiotic was given in an appropriate time interval prior to skin puncture. ANESTHESIA/SEDATION: Versed 2 mg IV; Fentanyl 50 mcg IV; Moderate Sedation Time:  16 minutes The patient was continuously monitored during the procedure by the interventional radiology nurse under my direct supervision. FLUOROSCOPY TIME:  Fluoroscopy Time: 36 seconds (5 mGy). COMPLICATIONS: None immediate. PROCEDURE: Informed written consent was obtained from the patient after a discussion of the risks, benefits, and alternatives to treatment. Questions regarding the procedure were encouraged and answered. The left neck and chest were prepped with chlorhexidine in a sterile fashion, and a sterile drape was applied covering the operative field. Maximum barrier sterile technique with sterile gowns and gloves were used for the  procedure. A timeout was performed prior to the initiation of the procedure. After creating a small venotomy incision, a micropuncture kit was utilized to access the internal jugular vein. Real-time ultrasound guidance was utilized for vascular access including the acquisition of a permanent ultrasound image documenting patency of the accessed vessel. The microwire was utilized to measure appropriate catheter length. A stiff Glidewire was advanced to the level of the IVC and the micropuncture sheath was exchanged for a peel-away sheath. A palindrome tunneled hemodialysis catheter measuring 23 cm from tip to cuff was tunneled in a retrograde fashion from the anterior chest wall to the venotomy incision. The catheter was then placed through the peel-away sheath with tips ultimately positioned within the superior aspect of the right atrium. Final catheter positioning was confirmed and documented with a spot radiographic image. The catheter aspirates and flushes normally. The catheter was flushed with appropriate volume heparin dwells. The catheter exit site was secured with a 0-Prolene retention suture. The venotomy incision was closed with an interrupted 4-0 Vicryl, Dermabond and Steri-strips. Dressings were applied. The patient tolerated the procedure well without immediate post procedural complication. IMPRESSION: Successful placement of 23 cm tip to cuff tunneled hemodialysis catheter via the left internal jugular vein with tips terminating within the superior aspect of the right atrium. The catheter is ready for immediate use. Electronically Signed   By: Sandi Mariscal M.D.   On: 01/14/2017 15:28   Dg Chest Port 1 View  Result Date: 01/22/2017 CLINICAL DATA:  66 year old female with history of shortness of breath. EXAM: PORTABLE CHEST 1 VIEW COMPARISON:  Chest x-ray 01/20/2017. FINDINGS: There is a right-sided internal jugular central venous catheter with tip terminating in the superior cavoatrial junction.  Left-sided internal jugular PermCath with tip terminating in the superior cavoatrial junction. Film is underpenetrated limiting the diagnostic sensitivity and specificity of the examination. With this limitation in mind, there is potential retrocardiac opacity in the left lower lobe which could reflect atelectasis and/or airspace consolidation. No definite pleural effusions. There is cephalization of the pulmonary vasculature and slight indistinctness of the interstitial markings suggestive of mild pulmonary edema. Mild cardiomegaly. The patient is rotated to the left on today's exam, resulting in distortion of the mediastinal contours and reduced diagnostic sensitivity and  specificity for mediastinal pathology. Aortic atherosclerosis. IMPRESSION: 1. Support apparatus, as above. 2. Overall, the appearance the chest suggests mild congestive heart failure, as discussed above. 3. Possible area of atelectasis and/or consolidation in the left lower lobe. 4. Aortic atherosclerosis. Electronically Signed   By: Vinnie Langton M.D.   On: 01/22/2017 18:15   Dg Chest Port 1 View  Result Date: 01/20/2017 CLINICAL DATA:  Altered mental status short of breath EXAM: PORTABLE CHEST 1 VIEW COMPARISON:  01/09/2017 FINDINGS: Interval placement of left jugular dual-lumen catheter with tip in the SVC. Right jugular catheter tip in the SVC unchanged. No pneumothorax Interval improvement in diffuse bilateral airspace disease. Mild vascular congestion remains with left lower lobe atelectasis and small left effusion IMPRESSION: Left jugular dual-lumen catheter in good position. Improvement in bilateral edema. Electronically Signed   By: Franchot Gallo M.D.   On: 01/20/2017 08:41   Dg Chest Port 1 View  Result Date: 01/09/2017 CLINICAL DATA:  CHF EXAM: PORTABLE CHEST 1 VIEW COMPARISON:  01/07/2017 FINDINGS: Severe bilateral airspace disease similar to the prior study. Probable congestive heart failure with edema. Pneumonia not  excluded. Bibasilar atelectasis and small left effusion. Right jugular central venous catheter tip in the SVC unchanged IMPRESSION: Diffuse bilateral airspace disease unchanged. Probable heart failure. Electronically Signed   By: Franchot Gallo M.D.   On: 01/09/2017 11:27   Dg Chest Port 1 View  Result Date: 01/07/2017 CLINICAL DATA:  66 year old female central line placement. EXAM: PORTABLE CHEST 1 VIEW COMPARISON:  1016 hours today and earlier. FINDINGS: Portable AP semi upright view at 1432 hours. New right IJ approach central line is in place, tip projects at the level of the right mainstem bronchus corresponding to the lower SVC. No pneumothorax. Stable cardiac size and mediastinal contours. Stable bilateral ventilation since this morning including dense retrocardiac opacity which has progressed since 01/06/2017. Visualized tracheal air column is within normal limits. IMPRESSION: 1. Right IJ central line tip at the lower SVC level. No pneumothorax or adverse features. 2. Stable ventilation since 1016 hours today. Electronically Signed   By: Genevie Ann M.D.   On: 01/07/2017 15:02   Dg Chest Port 1 View  Result Date: 01/07/2017 CLINICAL DATA:  Respiratory distress. EXAM: PORTABLE CHEST 1 VIEW COMPARISON:  01/06/2017 FINDINGS: Lungs are adequately inflated with worsening patchy multifocal airspace opacification most notable over the left midlung and right upper lobe likely a multifocal infectious process. Possible small amount left pleural fluid. Mild stable cardiomegaly. Remainder of the exam is unchanged. IMPRESSION: Slight worsening bilateral multifocal airspace process most notable over the left midlung and right upper lobe suggesting multifocal infection. Stable cardiomegaly. Electronically Signed   By: Marin Olp M.D.   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 Hgb remains stable, receiving total of 6 units this admission. Unable  to complete colonoscopydue to poor prep. EGD with normal esophagus, medium-sized hiatal hernia, anemia partially due to moderate to severe gastritis and duodenitis. Pathology with gastritis, no H.pylori. No overt GI bleeding. Consider benefits versus risks of heparin with dialysis. Avoid aspirin.   Plan: 1. PPI BID. 2. Follow Hgb routinely. Continue follow up with hematology, Dr. Talbert Cage, for anemia.   3. Hold ASA. Consider benefits v risks of heparin with dialysis and Plavix.  4. Will sign off for now. Call with any questions.   Laureen Ochs. Bernarda Caffey [facility] [phone] 11/29/20189:01 AM     LOS: 21 days

## 2017-01-27 NOTE — ED Triage Notes (Signed)
Pt brought over from [place] by [facility] for c/o seeing black spots in her vision and stiffness to her right hand; pt was just discharged today from [facility] for CHF

## 2017-01-27 NOTE — Discharge Summary (Addendum)
Physician Discharge Summary  Kelli [name]:[id number] DOB: 20-May-1950 DOA: 01/06/2017  PCP: Manon Hilding, MD  Admit date: 01/06/2017 Discharge date: 01/27/2017  Admitted From: Home Disposition: Skilled nursing facility  Recommendations for Outpatient Follow-up:  1. Follow up with PCP in 1-2 weeks 2. Please obtain BMP/CBC in one week 3. Patient has been set up with outpatient dialysis on Tuesday, Thursday, Saturday 4. Consider starting on Plavix daily in 7 days if hemoglobin remained stable and she does not have any evidence of bleeding. 5. Strongly recommend that hospice/palliative care follows patient at facility to further discuss/educate around goals of care with patient's family  Discharge Condition: Guarded CODE STATUS: DNR Diet recommendation: Heart Healthy / Carb Modified  Brief/Interim Summary: 66 year old female with a history of previous stroke and residual left-sided hemiparesis, diastolic heart failure, chronic kidney disease, diabetes, presented to the hospital with progressive shortness of breath and lower extremity edema.  Found to have worsening anemia with a hemoglobin of 7.6.  Chest x-ray indicated pulmonary edema.  She was started on IV Lasix for fluid overload, but renal function continued to worsen.  She underwent dialysis catheter placement and began hemodialysis.  Overall volume status has improved.  Regarding anemia, she is received several units of PRBCs and was seen by gastroenterology for GI bleeding.  She underwent EGD and colonoscopy.  Found to have gastritis/duodenitis.  Also developed rapid atrial fibrillation now on oral beta-blockers for rate control.  MRI of the brain done on admission indicated possible acute stroke.  Not a candidate for anticoagulation due to ongoing issues with bleeding/anemia.  Palliative care has seen the patient/family and they wish to continue with available treatments.  Plan is for skilled nursing facility on discharge once she is  improved.  Discharge Diagnoses:  Principal Problem:   Acute metabolic encephalopathy Active Problems:   HTN (hypertension)   Mixed hyperlipidemia   Acute on chronic renal failure (HCC)   Elevated troponin   Acute on chronic diastolic CHF (congestive heart failure) (HCC)   Anemia   Diabetes mellitus type 2 in obese (HCC)   CKD (chronic kidney disease), stage IV (HCC)   Acute respiratory failure with hypoxia (HCC)   Precordial pain   Acute renal failure superimposed on stage 4 chronic kidney disease ([place])   Palliative care encounter   Goals of care, counseling/discussion   DNR (do not resuscitate) discussion   ESRD (end stage renal disease) ([place])   Gastritis and duodenitis   Pressure injury of skin  1. Chronic kidney disease stage IV, now progressed to end-stage renal disease.  Nephrology following.  She is now on hemodialysis.  Arrangements have been made for patient to receive outpatient dialysis on Tuesday, Thursday and Saturday.  She has a tunneled catheter in place. 2. Anemia.  Acute on chronic related to blood loss as well as renal disease.  She received a total of 6 units of PRBCs during this hospitalization.  Hemoglobin has now been stable and she is not had any further evidence of bleeding. She has been started on Epogen per nephrology.  She will need repeat CBC in 1 week to ensure stability. 3. GI bleeding.  Found to have gastritis/duodenitis on EGD which was likely contributing to GI blood loss.  She is on PPI.  Colonoscopy prep was not adequate for study.  Not a candidate for antiplatelet agents or anticoagulation due to risk of bleeding.  Overall blood loss appears to have subsided.. 4. Possible small acute lacunar infarct in the right  caudate nucleus.  Patient has a prior history of stroke with residual left-sided hemiparesis which had left her wheelchair-bound.  Workup including carotid Dopplers did not show any significant stenosis bilaterally.  Echocardiogram was also  unrevealing.  Ideally, she should be on anticoagulation due to her history of atrial fibrillation, but with ongoing issues with bleeding requiring transfusion of PRBCs, this is not appear to be a reasonable risk at this time.  This can be readdressed in the future.    Per GI, will hold off on further aspirin.  If she does not have any recurrent bleeding in the next week and hemoglobin remained stable, can consider starting on Plavix.  I believe bleeding risk from anticoagulation would be too high..  Family has been made aware of increased stroke risk in this situation and expressed understanding. 5. Atrial fibrillation with RVR.  Patient had an episode of rapid atrial fibrillation on 11/24.  Briefly required Cardizem infusion, now transitioned off and is on oral metoprolol. TSH is normal.  Not a candidate for anticoagulation due to ongoing bleeding issues. 6. Acute on chronic diastolic congestive heart failure.  Overall volume status has improved with dialysis.  Continue to manage volume status/fluid removal with dialysis.  Continue on beta-blocker 7. Diabetes.  A1c 5.7.  Continue on sliding scale insulin.  Blood sugars are stable. 8. Stage II sacral decubitus ulcer, present on admission, continue wound care 9. Discussion.  Patient was seen by palliative care regarding multiple medical problems and overall poor prognosis.  Her functional status at baseline is poor.  Her husband has agreed to DNR, but does not appear ready to accept hospice services.  I think she would be a good candidate for hospice.  Would recommend that hospice services follow the patient at the nursing facility to further educate patient and her family regarding end-of-life issues and benefits of hospice.  Her long-term prognosis is poor.  Discharge Instructions  Discharge Instructions    Diet - low sodium heart healthy   Complete by:  As directed    Increase activity slowly   Complete by:  As directed      Allergies as of  01/27/2017      Reactions   Hydrochlorothiazide Nausea And Vomiting      Medication List    STOP taking these medications   amLODipine 10 MG tablet Commonly known as:  NORVASC   metoprolol succinate 100 MG 24 hr tablet Commonly known as:  TOPROL-XL   nitrofurantoin 100 MG capsule Commonly known as:  MACRODANTIN   potassium chloride SA 20 MEQ tablet Commonly known as:  K-DUR,KLOR-CON   sodium bicarbonate 650 MG tablet   torsemide 20 MG tablet Commonly known as:  DEMADEX     TAKE these medications   atorvastatin 40 MG tablet Commonly known as:  LIPITOR Take 1 tablet (40 mg total) by mouth daily at 6 PM.   budesonide 0.5 MG/2ML nebulizer solution Commonly known as:  PULMICORT Take 2 mLs (0.5 mg total) by nebulization 2 (two) times daily.   calcitRIOL 0.25 MCG capsule Commonly known as:  ROCALTROL Take 1 capsule (0.25 mcg total) by mouth daily.   epoetin alfa 10000 UNIT/ML injection Commonly known as:  EPOGEN,PROCRIT Inject 1 mL (10,000 Units total) into the vein Every Tuesday,Thursday,and Saturday with dialysis. Start taking on:  01/29/2017   feeding supplement Liqd Take 1 Container by mouth 3 (three) times daily between meals.   guaiFENesin-dextromethorphan 100-10 MG/5ML syrup Commonly known as:  ROBITUSSIN DM Take 5 mLs  by mouth every 4 (four) hours as needed for cough.   hydrALAZINE 50 MG tablet Commonly known as:  APRESOLINE Take 1 tablet (50 mg total) by mouth 3 (three) times daily.   insulin aspart 100 UNIT/ML injection Commonly known as:  novoLOG Inject 0-15 Units into the skin 3 (three) times daily with meals.   ipratropium-albuterol 0.5-2.5 (3) MG/3ML Soln Commonly known as:  DUONEB Take 3 mLs by nebulization every 6 (six) hours as needed.   loratadine 10 MG tablet Commonly known as:  CLARITIN Take 1 tablet (10 mg total) by mouth daily.   metoprolol tartrate 50 MG tablet Commonly known as:  LOPRESSOR Take 1 tablet (50 mg total) by mouth 2  (two) times daily.   pantoprazole 40 MG tablet Commonly known as:  PROTONIX Take 1 tablet (40 mg total) by mouth 2 (two) times daily before a meal.   traMADol 50 MG tablet Commonly known as:  ULTRAM Take 1 tablet (50 mg total) by mouth every 12 (twelve) hours as needed for moderate pain.   venlafaxine XR 150 MG 24 hr capsule Commonly known as:  EFFEXOR-XR Take 150 mg by mouth daily with breakfast.      Contact information for after-discharge care    Destination    [facility] .   Service:  Skilled Nursing Contact information: [place] [phone]             Allergies  Allergen Reactions  . Hydrochlorothiazide Nausea And Vomiting    Procedures/Studies: Ct Abdomen Pelvis Wo Contrast  Result Date: 01/07/2017 CLINICAL DATA:  Abdominal pain. Decreased hemoglobin. Clinical suspicion for retroperitoneal hemorrhage. EXAM: CT ABDOMEN AND PELVIS WITHOUT CONTRAST TECHNIQUE: Multidetector CT imaging of the abdomen and pelvis was performed following the standard protocol without IV contrast. COMPARISON:  None. FINDINGS: Lower chest: Bibasilar heterogeneous airspace disease and small bilateral pleural effusions. Hepatobiliary: No mass visualized on this unenhanced exam. Gallbladder is unremarkable. Pancreas: No mass or inflammatory process visualized on this unenhanced exam. Spleen:  Within normal limits in size. Adrenals/Urinary tract: Bilateral renal parenchymal atrophy, left side greater than right, consistent with history of chronic kidney disease. Renal vascular calcification noted. Several tiny calculi are seen within the left renal collecting system and proximal left ureter, without hydronephrosis. Unremarkable unopacified urinary bladder. Stomach/Bowel: No evidence of obstruction, inflammatory process, or abnormal fluid collections. Normal appendix visualized. Mild left-sided colonic diverticulosis seen, without evidence of  diverticulitis. Vascular/Lymphatic: No pathologically enlarged lymph nodes identified. Infrarenal abdominal aortic aneurysm measuring 3.4 x 3.3 cm. Reproductive: Prior hysterectomy noted. Adnexal regions are unremarkable in appearance. Other: No evidence of retroperitoneal hemorrhage or hemoperitoneum. Mild edema in subcutaneous tissues. Musculoskeletal:  No suspicious bone lesions identified. IMPRESSION: No evidence of retroperitoneal hemorrhage or hemoperitoneum. Several tiny calculi in left renal collecting system and proximal left ureter, without hydronephrosis. Bilateral renal parenchymal atrophy, left side greater than right. Colonic diverticulosis, without radiographic evidence of diverticulitis. Small bilateral pleural effusions and bilateral lower lobe airspace disease. 3.4 cm infrarenal abdominal aortic aneurysm. Recommend followup by ultrasound in 3 years. This recommendation follows ACR consensus guidelines: White Paper of the ACR Incidental Findings Committee II on Vascular Findings. Natasha Mead Coll Radiol 2013; 10:789-794 Electronically Signed   By: Earle Gell M.D.   On: 01/07/2017 10:22   Dg Chest 2 View  Result Date: 01/06/2017 CLINICAL DATA:  Trouble breathing x1 day with tightness and congestion in chest. Weakness. Hx of diabetes, HTN-controlled with medication, and low hemoglobin.  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 EXAM: CHEST  2 VIEW COMPARISON:  10/19/2016 FINDINGS: The heart is enlarged. There is pulmonary vascular congestion. Prominent interstitial markings are consistent with mild edema. Trace bilateral pleural effusions are also suspected. There is mild bibasilar atelectasis. IMPRESSION: Cardiomegaly and mild pulmonary edema. Electronically Signed   By: Nolon Nations M.D.   On: 01/06/2017 16:56   Ct Head Wo Contrast  Result Date: 01/19/2017 CLINICAL DATA:  Unexplained altered level of consciousness. EXAM: CT HEAD WITHOUT CONTRAST TECHNIQUE: Contiguous axial images  were obtained from the base of the skull through the vertex without intravenous contrast. COMPARISON:  Brain MRI 09/17/2013 FINDINGS: Brain: Confluent low-density in the cerebral white matter consistent with chronic small vessel ischemia. Remote moderate to large right PCA distribution affecting the inferior temporal and occipital lobes. Small remote high right posterior frontal and left parietal cortex infarcts. Patchy indistinct bilateral deep gray nuclei consistent with chronic small vessel ischemic injury. Remote right pontine infarct. Remote small vessel left cerebellar infarct. The left thalamus is asymmetrically low-density compared to the right common mildly expanded. This could reflect a recent infarct. No hemorrhage, hydrocephalus, or shift. Vascular: Atherosclerotic calcification. Small right ICA at the skullbase. There was right ICA occlusion on prior exam. No hyperdense vessel Skull: No acute or aggressive finding. Sinuses/Orbits: No acute finding IMPRESSION: 1. Compared to 2015 there is patchy low-density in the left thalamus with mild expansion. This may reflect a recent infarct if there is associated deficit. MRI could characterize if clinically appropriate. 2. Advanced chronic ischemic injury as noted above. 3. 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 Electronically Signed   By: Monte Fantasia M.D.   On: 01/19/2017 15:50   Mr Brain Wo Contrast  Result Date: 01/21/2017 CLINICAL DATA:  66 year old female with altered mental status for 2 days. Abnormal density in the left thalamus new compared to 2015 on recent head CT. EXAM: MRI HEAD WITHOUT CONTRAST TECHNIQUE: Multiplanar, multiecho pulse sequences of the brain and surrounding structures were obtained without intravenous contrast. COMPARISON:  Head CT without contrast 01/19/2017. Brain MRI 09/17/2013. FINDINGS: Brain: The only possible focus of acutely restricted diffusion is a small area in the right caudate nucleus on  series 3, image 87. Mild T2 and FLAIR hyperintensity with no hemorrhage or mass effect. Severe chronic ischemic disease. Cortical encephalomalacia in the right greater than left MCA and right PCA territories with advanced Wallerian degeneration in the right brainstem. Progressed since 2015 and confluent abnormal signal in the left deep gray matter nuclei, but mostly facilitated on diffusion indicating subacute to chronic time course. Progressed chronic small vessel ischemia in the left cerebellum since 2015. Progressed patchy and confluent left MCA territory white matter T2 and FLAIR hyperintensity. Scattered post ischemic hemosiderin and occasional other chronic micro hemorrhages. No midline shift, mass effect, evidence of mass lesion, ventriculomegaly, extra-axial collection or acute intracranial hemorrhage. Cervicomedullary junction and pituitary are within normal limits. 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 Other Major intracranial vascular flow voids are stable. The distal right vertebral artery is dominant. Skull and upper cervical spine: Negative. Visualized bone marrow signal is within normal limits. Sinuses/Orbits: Stable orbits soft tissues. Mild paranasal sinus mucosal thickening has not significantly changed. Other: Mild left mastoid effusion is new but significance is doubtful. Small volume retained secretions in the left nasopharynx. Right mastoids are clear. Scalp and face soft tissues appear negative. IMPRESSION: 1. Possible small acute lacunar infarct in  the right caudate nucleus. No associated hemorrhage or mass effect. 2. No other acute ischemia identified, but very severe chronic ischemic disease. Progression in the left deep gray matter nuclei and the left cerebellum since 2015. Right greater than left chronic MCA and right PCA territory encephalomalacia with advanced Wallerian degeneration in the right brainstem. 3. Chronic right ICA occlusion. Electronically  Signed   By: Genevie Ann M.D.   On: 01/21/2017 07:56   US Carotid Bilateral  Result Date: 01/22/2017 CLINICAL DATA:  CVA. 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 Status post left carotid endarterectomy in 2014. EXAM: BILATERAL CAROTID DUPLEX ULTRASOUND TECHNIQUE: Pearline Cables scale imaging, color Doppler and duplex ultrasound were performed of bilateral carotid and vertebral arteries in the neck. COMPARISON:  Brain MRI 01/21/2017. Carotid duplex report from 06/11/2014 FINDINGS: Criteria: Quantification of carotid stenosis is based on velocity parameters that correlate the residual internal carotid diameter with NASCET-based stenosis levels, using the diameter of the distal internal carotid lumen as the denominator for stenosis measurement. The following velocity measurements were obtained: RIGHT ICA:  Occluded CCA:  94 cm/sec ECA:  234 cm/sec LEFT ICA:  165 cm/sec CCA:  270 cm/sec SYSTOLIC ICA/CCA RATIO:  1.2 DIASTOLIC ICA/CCA RATIO:  1.8 ECA:  176 cm/sec RIGHT CAROTID ARTERY: Heterogeneous plaque in the proximal common carotid artery. Irregular echogenic plaque at the right carotid bulb. Occlusion of the internal carotid artery near the origin. Right external carotid artery is patent. RIGHT VERTEBRAL ARTERY: Antegrade flow and normal waveform in the right vertebral artery. LEFT CAROTID ARTERY: Small amount of plaque in left common carotid artery. Left carotid bulb is patulous and compatible with previous endarterectomy. External carotid artery is patent with normal waveform. Small amount of plaque in the left internal carotid artery. Mild tortuosity in the distal internal carotid artery with a slightly elevated peak systolic velocity measuring up to 165 cm/sec. LEFT VERTEBRAL ARTERY: Retrograde flow in the left vertebral artery. This finding appears chronic based on the previous ultrasound report. IMPRESSION: Chronic occlusion of the right internal carotid artery. Surgical  changes compatible with a left carotid endarterectomy. Mild plaque and tortuosity in the left internal carotid artery. Estimated degree of stenosis in the left internal carotid artery is less than 50%. Retrograde flow in the left vertebral artery. 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 This appears to be a chronic finding. Antegrade flow in the right vertebral artery. Electronically Signed   By: Markus Daft M.D.   On: 01/22/2017 08:59   US Venous Img Lower Bilateral  Result Date: 01/19/2017 CLINICAL DATA:  Bilateral lower extremity swelling. EXAM: BILATERAL LOWER EXTREMITY VENOUS DOPPLER ULTRASOUND TECHNIQUE: Gray-scale sonography with graded compression, as well as color Doppler and duplex ultrasound were performed to evaluate the lower extremity deep venous systems from the level of the common femoral vein and including the common femoral, femoral, profunda femoral, popliteal and calf veins including the posterior tibial, peroneal and gastrocnemius veins when visible. The superficial great saphenous vein was also interrogated. Spectral Doppler was utilized to evaluate flow at rest and with distal augmentation maneuvers in the common femoral, femoral and popliteal veins. COMPARISON:  10/13/2016 FINDINGS: RIGHT LOWER EXTREMITY Common Femoral Vein: No evidence of thrombus. Normal compressibility, respiratory phasicity and response to augmentation. Saphenofemoral Junction: No evidence of thrombus. Normal compressibility and flow on color Doppler imaging. Profunda Femoral Vein: No evidence of thrombus. Normal compressibility and flow on color Doppler imaging. Femoral Vein: No evidence  of thrombus. Normal compressibility, respiratory phasicity and response to augmentation. Popliteal Vein: No evidence of thrombus. Normal compressibility, respiratory phasicity and response to augmentation. Calf Veins: No evidence of thrombus. Normal compressibility and flow on  color Doppler imaging. Superficial Great Saphenous Vein: No evidence of thrombus. Normal compressibility. Venous Reflux:  None. Other Findings:  None. LEFT LOWER EXTREMITY Common Femoral Vein: No evidence of thrombus. Normal compressibility, respiratory phasicity and response to augmentation. Saphenofemoral Junction: No evidence of thrombus. Normal compressibility and flow on color Doppler imaging. Profunda Femoral Vein: No evidence of thrombus. Normal compressibility and flow on color Doppler imaging. Femoral Vein: No evidence of thrombus. Normal compressibility, respiratory phasicity and response to augmentation. Popliteal Vein: No evidence of thrombus. Normal compressibility, respiratory phasicity and response to augmentation. Calf Veins: No evidence of thrombus. Normal compressibility and flow on color Doppler imaging. Superficial Great Saphenous Vein: No evidence of thrombus. Normal compressibility. Venous Reflux:  None. Other Findings:  None. IMPRESSION: No evidence of deep venous thrombosis. Electronically Signed   By: Rolm Baptise M.D.   On: 01/19/2017 15:35   Ir Fluoro Guide Cv Line Left  Result Date: 01/14/2017 INDICATION: End-stage renal disease, in need of durable intravenous access for continuation of dialysis. EXAM: TUNNELED CENTRAL VENOUS HEMODIALYSIS CATHETER PLACEMENT WITH ULTRASOUND AND FLUOROSCOPIC GUIDANCE MEDICATIONS: Ancef 2 gm IV . The antibiotic was given in an appropriate time interval prior to skin puncture. ANESTHESIA/SEDATION: Versed 2 mg IV; Fentanyl 50 mcg IV; Moderate Sedation Time:  16 minutes The patient was continuously monitored during the procedure by the interventional radiology nurse under my direct supervision. FLUOROSCOPY TIME:  Fluoroscopy Time: 36 seconds (5 mGy). COMPLICATIONS: None immediate. PROCEDURE: Informed written consent was obtained from the patient after a discussion of the risks, benefits, and alternatives to treatment. Questions regarding the procedure were  encouraged and answered. The left neck and chest were prepped with chlorhexidine in a sterile fashion, and a sterile drape was applied covering the operative field. Maximum barrier sterile technique with sterile gowns and gloves were used for the procedure. A timeout was performed prior to the initiation of the procedure. After creating a small venotomy incision, a micropuncture kit was utilized to access the internal jugular vein. Real-time ultrasound guidance was utilized for vascular access including the acquisition of a permanent ultrasound image documenting patency of the accessed vessel. The microwire was utilized to measure appropriate catheter length. A stiff Glidewire was advanced to the level of the IVC and the micropuncture sheath was exchanged for a peel-away sheath. A palindrome tunneled hemodialysis catheter measuring 23 cm from tip to cuff was tunneled in a retrograde fashion from the anterior chest wall to the venotomy incision. The catheter was then placed through the peel-away sheath with tips ultimately positioned within the superior aspect of the right atrium. Final catheter positioning was confirmed and documented with a spot radiographic image. The catheter aspirates and flushes normally. The catheter was flushed with appropriate volume heparin dwells. The catheter exit site was secured with a 0-Prolene retention suture. The venotomy incision was closed with an interrupted 4-0 Vicryl, Dermabond and Steri-strips. Dressings were applied. The patient tolerated the procedure well without immediate post procedural complication. IMPRESSION: Successful placement of 23 cm tip to cuff tunneled hemodialysis catheter via the left internal jugular vein with tips terminating within the superior aspect of the right atrium. The catheter is ready for immediate use. Electronically Signed   By: Sandi Mariscal M.D.   On: 01/14/2017 15:28   Ir US Guide  Vasc Access Left  Result Date: 01/14/2017 INDICATION:  End-stage renal disease, in need of durable intravenous access for continuation of dialysis. EXAM: TUNNELED CENTRAL VENOUS HEMODIALYSIS CATHETER PLACEMENT WITH ULTRASOUND AND FLUOROSCOPIC GUIDANCE MEDICATIONS: Ancef 2 gm IV . The antibiotic was given in an appropriate time interval prior to skin puncture. ANESTHESIA/SEDATION: Versed 2 mg IV; Fentanyl 50 mcg IV; Moderate Sedation Time:  16 minutes The patient was continuously monitored during the procedure by the interventional radiology nurse under my direct supervision. FLUOROSCOPY TIME:  Fluoroscopy Time: 36 seconds (5 mGy). COMPLICATIONS: None immediate. PROCEDURE: Informed written consent was obtained from the patient after a discussion of the risks, benefits, and alternatives to treatment. Questions regarding the procedure were encouraged and answered. The left neck and chest were prepped with chlorhexidine in a sterile fashion, and a sterile drape was applied covering the operative field. Maximum barrier sterile technique with sterile gowns and gloves were used for the procedure. A timeout was performed prior to the initiation of the procedure. After creating a small venotomy incision, a micropuncture kit was utilized to access the internal jugular vein. Real-time ultrasound guidance was utilized for vascular access including the acquisition of a permanent ultrasound image documenting patency of the accessed vessel. The microwire was utilized to measure appropriate catheter length. A stiff Glidewire was advanced to the level of the IVC and the micropuncture sheath was exchanged for a peel-away sheath. A palindrome tunneled hemodialysis catheter measuring 23 cm from tip to cuff was tunneled in a retrograde fashion from the anterior chest wall to the venotomy incision. The catheter was then placed through the peel-away sheath with tips ultimately positioned within the superior aspect of the right atrium. Final catheter positioning was confirmed and documented  with a spot radiographic image. The catheter aspirates and flushes normally. The catheter was flushed with appropriate volume heparin dwells. The catheter exit site was secured with a 0-Prolene retention suture. The venotomy incision was closed with an interrupted 4-0 Vicryl, Dermabond and Steri-strips. Dressings were applied. The patient tolerated the procedure well without immediate post procedural complication. IMPRESSION: Successful placement of 23 cm tip to cuff tunneled hemodialysis catheter via the left internal jugular vein with tips terminating within the superior aspect of the right atrium. The catheter is ready for immediate use. Electronically Signed   By: Sandi Mariscal M.D.   On: 01/14/2017 15:28   Dg Chest Port 1 View  Result Date: 01/22/2017 CLINICAL DATA:  66 year old female with history of shortness of breath. EXAM: PORTABLE CHEST 1 VIEW COMPARISON:  Chest x-ray 01/20/2017. FINDINGS: There is a right-sided internal jugular central venous catheter with tip terminating in the superior cavoatrial junction. Left-sided internal jugular PermCath with tip terminating in the superior cavoatrial junction. Film is underpenetrated limiting the diagnostic sensitivity and specificity of the examination. With this limitation in mind, there is potential retrocardiac opacity in the left lower lobe which could reflect atelectasis and/or airspace consolidation. No definite pleural effusions. There is cephalization of the pulmonary vasculature and slight indistinctness of the interstitial markings suggestive of mild pulmonary edema. Mild cardiomegaly. The patient is rotated to the left on today's exam, resulting in distortion of the mediastinal contours and reduced diagnostic sensitivity and specificity for mediastinal pathology. Aortic atherosclerosis. IMPRESSION: 1. Support apparatus, as above. 2. Overall, the appearance the chest suggests mild congestive heart failure, as discussed above. 3. Possible area of  atelectasis and/or consolidation in the left lower lobe. 4. Aortic atherosclerosis. Electronically Signed   By: Mauri Brooklyn.D.  On: 01/22/2017 18:15   Dg Chest Port 1 View  Result Date: 01/20/2017 CLINICAL DATA:  Altered mental status short of breath EXAM: PORTABLE CHEST 1 VIEW COMPARISON:  01/09/2017 FINDINGS: Interval placement of left jugular dual-lumen catheter with tip in the SVC. Right jugular catheter tip in the SVC unchanged. No pneumothorax Interval improvement in diffuse bilateral airspace disease. Mild vascular congestion remains with left lower lobe atelectasis and small left effusion IMPRESSION: Left jugular dual-lumen catheter in good position. Improvement in bilateral edema. Electronically Signed   By: Franchot Gallo M.D.   On: 01/20/2017 08:41   Dg Chest Port 1 View  Result Date: 01/09/2017 CLINICAL DATA:  CHF EXAM: PORTABLE CHEST 1 VIEW COMPARISON:  01/07/2017 FINDINGS: Severe bilateral airspace disease similar to the prior study. Probable congestive heart failure with edema. Pneumonia not excluded. Bibasilar atelectasis and small left effusion. Right jugular central venous catheter tip in the SVC unchanged IMPRESSION: Diffuse bilateral airspace disease unchanged. Probable heart failure. Electronically Signed   By: Franchot Gallo M.D.   On: 01/09/2017 11:27   Dg Chest Port 1 View  Result Date: 01/07/2017 CLINICAL DATA:  66 year old female central line placement. EXAM: PORTABLE CHEST 1 VIEW COMPARISON:  1016 hours today and earlier. FINDINGS: Portable AP semi upright view at 1432 hours. New right IJ approach central line is in place, tip projects at the level of the right mainstem bronchus corresponding to the lower SVC. No pneumothorax. Stable cardiac size and mediastinal contours. Stable bilateral ventilation since this morning including dense retrocardiac opacity which has progressed since 01/06/2017. Visualized tracheal air column is within normal limits. IMPRESSION: 1.  Right IJ central line tip at the lower SVC level. No pneumothorax or adverse features. 2. Stable ventilation since 1016 hours today. Electronically Signed   By: Genevie Ann M.D.   On: 01/07/2017 15:02   Dg Chest Port 1 View  Result Date: 01/07/2017 CLINICAL DATA:  Respiratory distress. EXAM: PORTABLE CHEST 1 VIEW COMPARISON:  01/06/2017 FINDINGS: Lungs are adequately inflated with worsening patchy multifocal airspace opacification most notable over the left midlung and right upper lobe likely a multifocal infectious process. Possible small amount left pleural fluid. Mild stable cardiomegaly. Remainder of the exam is unchanged. IMPRESSION: Slight worsening bilateral multifocal airspace process most notable over the left midlung and right upper lobe suggesting multifocal infection. Stable cardiomegaly. Electronically Signed   By: Marin Olp M.D.   On: 01/07/2017 10:37    Consultants:   Nephrology  Cardiology  Interventional radiology  Palliative care  Procedures:  11/10 echocardiogram: - Left ventricle: The cavity size was normal. Systolic function was normal. The estimated ejection fraction was in the range of 55% to 60%. Wall motion was normal; there were no regional wall motion abnormalities. The study is not technically sufficient to allow evaluation of LV diastolic function due to tachycardia and E/a fusion. Moderate to severe left ventricular hypertrophy. - Aortic valve: Trileaflet; mildly thickened, mildly calcified leaflets. Sclerosis without stenosis. There was no stenosis. - Mitral valve: Calcified annulus. - Left atrium: The atrium was mildly dilated. - Atrial septum: No defect or patent foramen ovale was identified.  11/13 placement of temporary dialysis catheter by general surgery  11/16 tunneled dialysis catheter placement by interventional radiology  11/24 colonoscopy:- Preparation of the colon was poor. - Stool in the rectum  and in the recto-sigmoid  colon  11/24 EGD:- Normal esophagus. - Medium-sized hiatal hernia. - ANEMIA PARTIALLY DUE TO MODERATE TO SEVERE  Gastritis &Duodenitis.  Subjective: Denies any shortness of breath, pain, nausea or vomiting  Discharge Exam: Vitals:   01/27/17 1437 01/27/17 1459  BP:    Pulse:    Resp:    Temp:    SpO2: 94% 96%   Vitals:   01/27/17 1235 01/27/17 1332 01/27/17 1437 01/27/17 1459  BP: (!) 165/68 (!) 143/58    Pulse: 80 100    Resp: 20 18    Temp: 98.4 F (36.9 C) 98.7 F (37.1 C)    TempSrc: Oral Oral    SpO2:  99% 94% 96%  Weight:      Height:        General: Pt is drowsy, not in acute distress Cardiovascular: Irregular, S1/S2 +, no rubs, no gallops Respiratory: CTA bilaterally, no wheezing, no rhonchi Abdominal: Soft, NT, ND, bowel sounds + Extremities: no edema, no cyanosis    The results of significant diagnostics from this hospitalization (including imaging, microbiology, ancillary and laboratory) are listed below for reference.     Microbiology: Recent Results (from the past 240 hour(s))  MRSA PCR Screening     Status: None   Collection Time: 01/22/17  7:21 PM  Result Value Ref Range Status   MRSA by PCR NEGATIVE NEGATIVE Final    Comment:        The GeneXpert MRSA Assay (FDA approved for NASAL specimens only), is one component of a comprehensive MRSA colonization surveillance program. It is not intended to diagnose MRSA infection nor to guide or monitor treatment for MRSA infections.      Labs: BNP (last 3 results) Recent Labs    10/13/16 0352 01/06/17 1655  BNP 383.0* 735.6*   Basic Metabolic Panel: Recent Labs  Lab 01/21/17 0451 01/22/17 0443  01/24/17 0438 01/25/17 0555 01/25/17 1533 01/26/17 0613 01/27/17 0452  NA 133* 137   < > 137 139 138 138 137  K 3.8 3.6   < > 4.0 4.0 3.2* 3.7 4.1  CL  97* 99*   < > 102 105 102 102 101  CO2 28 28   < > 25 23 28 27 25   GLUCOSE 122* 162*   < > 149* 142* 214* 142* 129*  BUN 39* 26*   < > 60* 68* 28* 50* 63*  CREATININE 3.45* 2.81*   < > 5.22* 5.00* 2.48* 3.78* 4.55*  CALCIUM 6.8* 7.7*   < > 7.0* 7.0* 7.3* 6.8* 6.7*  PHOS 2.5 2.5  --   --  4.6 2.6 4.2  --    < > = values in this interval not displayed.   Liver Function Tests: Recent Labs  Lab 01/21/17 0451 01/22/17 0443 01/25/17 0555 01/25/17 1533 01/26/17 0613  ALBUMIN 2.7* 3.0* 2.7* 2.7* 2.5*   No results for input(s): LIPASE, AMYLASE in the last 168 hours. No results for input(s): AMMONIA in the last 168 hours. CBC: Recent Labs  Lab 01/24/17 0438 01/25/17 0555 01/25/17 1533 01/26/17 0613 01/27/17 0452  WBC 8.0 7.1 6.6 6.4 7.5  HGB 8.9* 8.7* 8.7* 7.9* 8.0*  HCT 28.4* 27.8* 27.4* 25.4* 25.8*  MCV 93.1 93.3 91.9 92.7 92.8  PLT 149* 147* 138* 141* 159   Cardiac Enzymes: No results for input(s): CKTOTAL, CKMB, CKMBINDEX, TROPONINI in the last 168 hours. BNP: Invalid input(s): POCBNP CBG: Recent Labs  Lab 01/26/17 1207 01/26/17 1608 01/26/17 2214 01/27/17 0733 01/27/17 1114  GLUCAP 145* 150* 140* 116* 118*   D-Dimer No results for input(s): DDIMER in the last 72 hours. Hgb A1c No results for  input(s): HGBA1C in the last 72 hours. Lipid Profile No results for input(s): CHOL, HDL, LDLCALC, TRIG, CHOLHDL, LDLDIRECT in the last 72 hours. Thyroid function studies No results for input(s): TSH, T4TOTAL, T3FREE, THYROIDAB in the last 72 hours.  Invalid input(s): FREET3 Anemia work up No results for input(s): VITAMINB12, FOLATE, FERRITIN, TIBC, IRON, RETICCTPCT in the last 72 hours. Urinalysis    Component Value Date/Time   COLORURINE YELLOW 01/10/2017 1150   APPEARANCEUR CLOUDY (A) 01/10/2017 1150   LABSPEC 1.011 01/10/2017 1150   PHURINE 7.0 01/10/2017 1150   GLUCOSEU NEGATIVE 01/10/2017 1150   HGBUR NEGATIVE 01/10/2017 1150   BILIRUBINUR NEGATIVE 01/10/2017  1150   KETONESUR NEGATIVE 01/10/2017 1150   PROTEINUR 100 (A) 01/10/2017 1150   UROBILINOGEN 0.2 04/25/2014 2315   NITRITE NEGATIVE 01/10/2017 1150   LEUKOCYTESUR MODERATE (A) 01/10/2017 1150   Sepsis Labs Invalid input(s): PROCALCITONIN,  WBC,  LACTICIDVEN Microbiology Recent Results (from the past 240 hour(s))  MRSA PCR Screening     Status: None   Collection Time: 01/22/17  7:21 PM  Result Value Ref Range Status   MRSA by PCR NEGATIVE NEGATIVE Final    Comment:        The GeneXpert MRSA Assay (FDA approved for NASAL specimens only), is one component of a comprehensive MRSA colonization surveillance program. It is not intended to diagnose MRSA infection nor to guide or monitor treatment for MRSA infections.      Time coordinating discharge: Over 30 minutes  SIGNED:   Kathie Dike, MD  [facility] 01/27/2017, 3:23 PM Pager   If 7PM-7AM, please contact night-coverage [email or web address] Password [email or web address]

## 2017-01-27 NOTE — Progress Notes (Signed)
IJ removed per MD order by Barbra Sarks RN, patient tolerated well.

## 2017-01-27 NOTE — Clinical Social Work Placement (Signed)
   CLINICAL SOCIAL WORK PLACEMENT  NOTE  Date:  01/27/2017  Patient Details  Name: Kelli [name] MRN: [id number] Date of Birth: 1950/03/16  Clinical Social Work is seeking post-discharge placement for this patient at the [place] level of care (*CSW will initial, date and re-position this form in  chart as items are completed):  Yes   Patient/family provided with [facility] Work Department's list of facilities offering this level of care within the geographic area requested by the patient (or if unable, by the patient's family).  Yes   Patient/family informed of their freedom to choose among providers that offer the needed level of care, that participate in Medicare, Medicaid or managed care program needed by the patient, have an available bed and are willing to accept the patient.  Yes   Patient/family informed of [facility]'s ownership interest in [facility] and [facility], as well as of the fact that they are under no obligation to receive care at these facilities.  PASRR submitted to EDS on       PASRR number received on       Existing PASRR number confirmed on 01/21/17     FL2 transmitted to all facilities in geographic area requested by pt/family on 01/21/17     FL2 transmitted to all facilities within larger geographic area on       Patient informed that his/her managed care company has contracts with or will negotiate with certain facilities, including the following:        Yes   Patient/family informed of bed offers received.  Patient chooses bed at [place] [facility]     Physician recommends and patient chooses bed at      Patient to be transferred to [facility] [place] on 01/27/17.  Patient to be transferred to facility by [facility]     Patient family notified on 01/27/17 of transfer.  Name of family member notified:  spouse (voicemail messge left)     PHYSICIAN       Additional Comment:     _______________________________________________ Ihor Gully, LCSW 01/27/2017, 2:54 PM

## 2017-01-27 NOTE — Progress Notes (Signed)
Subjective: Interval History: Still patient complains of some congestion, congestion and some difficulty breathing.  Complains of also weakness.  Her appetite is okay and she does not have any nausea or vomiting.  Objective: Vital signs in last 24 hours: Temp:  [97.6 F (36.4 C)-98.6 F (37 C)] 98.2 F (36.8 C) (11/29 0604) Pulse Rate:  [89-101] 89 (11/29 0604) Resp:  [17-24] 19 (11/29 0604) BP: (94-138)/(53-82) 138/66 (11/29 0604) SpO2:  [92 %-100 %] 93 % (11/29 0821) Weight:  [87.2 kg (192 lb 3.9 oz)] 87.2 kg (192 lb 3.9 oz) (11/29 0604) Weight change: 0.9 kg (1 lb 15.8 oz)  Intake/Output from previous day: 11/28 0701 - 11/29 0700 In: 240 [P.O.:240] Out: 200 [Urine:200] Intake/Output this shift: No intake/output data recorded.  Patient seen at the beginning of dialysis. Chest: She has some expiratory wheezing and inspiratory crackles Heart exam irevealed regular rate and rhythm no murmur Abdomen: Positive bowel sounds Extremities no edema   Lab Results: Recent Labs    01/26/17 0613 01/27/17 0452  WBC 6.4 7.5  HGB 7.9* 8.0*  HCT 25.4* 25.8*  PLT 141* 159   BMET:  Recent Labs    01/26/17 0613 01/27/17 0452  NA 138 137  K 3.7 4.1  CL 102 101  CO2 27 25  GLUCOSE 142* 129*  BUN 50* 63*  CREATININE 3.78* 4.55*  CALCIUM 6.8* 6.7*   No results for input(s): PTH in the last 72 hours. Iron Studies: No results for input(s): IRON, TIBC, TRANSFERRIN, FERRITIN in the last 72 hours.  Studies/Results: No results found.  I have reviewed the patient's current medications.  Assessment/Plan: Problem #1 renal failure: She is status post hemodialysis on Tuesday.  Presently dialysis is being initiated.  Her appetite remains poor but she does not have any nausea or vomiting. 2] hyperkalemia: her potassium is normal  3] history of CHF: No sign of fluid overload.  She complains of some congestion and some difficulty in breathing. 4] bone and mineral disorder: Her calcium and  phosphorus is in the range.  Patient is not on a binder. 5] anemia:.  She is status post blood transfusion.  Her hemoglobin is low but remains a stable. 6] hypertension: Her blood pressure is high this morning. 7] CVA: MRI showed possible small acute lacunar infarct of the right caudate. 8] history of atrial fibrillation: Her heart rate is controlled. Plan:1]will remove about 3 L today if her blood pressure tolerates. 2] we will check a renal panel and CBC in the morning. 3] we will continue with Epogen.      LOS: 21 days   Khylin Gutridge S 01/27/2017,8:45 AM

## 2017-01-28 ENCOUNTER — Observation Stay (HOSPITAL_COMMUNITY): Payer: 59

## 2017-01-28 ENCOUNTER — Encounter (HOSPITAL_COMMUNITY): Payer: Self-pay | Admitting: Family Medicine

## 2017-01-28 DIAGNOSIS — Z8673 Personal history of transient ischemic attack (TIA), and cerebral infarction without residual deficits: Secondary | ICD-10-CM | POA: Diagnosis not present

## 2017-01-28 DIAGNOSIS — R195 Other fecal abnormalities: Secondary | ICD-10-CM

## 2017-01-28 DIAGNOSIS — Z992 Dependence on renal dialysis: Secondary | ICD-10-CM | POA: Diagnosis not present

## 2017-01-28 DIAGNOSIS — G459 Transient cerebral ischemic attack, unspecified: Secondary | ICD-10-CM | POA: Diagnosis not present

## 2017-01-28 DIAGNOSIS — R748 Abnormal levels of other serum enzymes: Secondary | ICD-10-CM | POA: Diagnosis not present

## 2017-01-28 DIAGNOSIS — E1169 Type 2 diabetes mellitus with other specified complication: Secondary | ICD-10-CM | POA: Diagnosis not present

## 2017-01-28 DIAGNOSIS — I509 Heart failure, unspecified: Secondary | ICD-10-CM

## 2017-01-28 DIAGNOSIS — E669 Obesity, unspecified: Secondary | ICD-10-CM

## 2017-01-28 DIAGNOSIS — N186 End stage renal disease: Secondary | ICD-10-CM | POA: Diagnosis not present

## 2017-01-28 DIAGNOSIS — D649 Anemia, unspecified: Secondary | ICD-10-CM

## 2017-01-28 DIAGNOSIS — I10 Essential (primary) hypertension: Secondary | ICD-10-CM | POA: Diagnosis not present

## 2017-01-28 DIAGNOSIS — I5033 Acute on chronic diastolic (congestive) heart failure: Secondary | ICD-10-CM | POA: Diagnosis not present

## 2017-01-28 DIAGNOSIS — H538 Other visual disturbances: Secondary | ICD-10-CM | POA: Diagnosis not present

## 2017-01-28 LAB — GLUCOSE, CAPILLARY
GLUCOSE-CAPILLARY: 136 mg/dL — AB (ref 65–99)
GLUCOSE-CAPILLARY: 137 mg/dL — AB (ref 65–99)
Glucose-Capillary: 199 mg/dL — ABNORMAL HIGH (ref 65–99)
Glucose-Capillary: 120 mg/dL — ABNORMAL HIGH (ref 65–99)

## 2017-01-28 LAB — COMPREHENSIVE METABOLIC PANEL
ALT: 10 U/L — AB (ref 14–54)
ANION GAP: 10 (ref 5–15)
AST: 16 U/L (ref 15–41)
Albumin: 2.9 g/dL — ABNORMAL LOW (ref 3.5–5.0)
Alkaline Phosphatase: 70 U/L (ref 38–126)
BUN: 35 mg/dL — ABNORMAL HIGH (ref 6–20)
CHLORIDE: 98 mmol/L — AB (ref 101–111)
CO2: 27 mmol/L (ref 22–32)
Calcium: 7 mg/dL — ABNORMAL LOW (ref 8.9–10.3)
Creatinine, Ser: 3.15 mg/dL — ABNORMAL HIGH (ref 0.44–1.00)
GFR calc non Af Amer: 14 mL/min — ABNORMAL LOW (ref >60)
GFR, EST AFRICAN AMERICAN: 17 mL/min — AB (ref >60)
Glucose, Bld: 204 mg/dL — ABNORMAL HIGH (ref 65–99)
POTASSIUM: 4.1 mmol/L (ref 3.5–5.1)
SODIUM: 135 mmol/L (ref 135–145)
Total Bilirubin: 0.5 mg/dL (ref 0.3–1.2)
Total Protein: 5.8 g/dL — ABNORMAL LOW (ref 6.5–8.1)

## 2017-01-28 LAB — BRAIN NATRIURETIC PEPTIDE: B Natriuretic Peptide: 1264 pg/mL — ABNORMAL HIGH (ref 0.0–100.0)

## 2017-01-28 LAB — TROPONIN I
TROPONIN I: 0.04 ng/mL — AB (ref <0.03)
Troponin I: 0.04 ng/mL (ref <0.03)

## 2017-01-28 MED ORDER — EPOETIN ALFA 10000 UNIT/ML IJ SOLN
8000.0000 [IU] | INTRAMUSCULAR | Status: DC
  Filled 2017-01-28: qty 1

## 2017-01-28 MED ORDER — ACETAMINOPHEN 650 MG RE SUPP: 650.0000 mg | RECTAL | Status: DC | PRN

## 2017-01-28 MED ORDER — SODIUM CHLORIDE 0.9 % IV SOLN: 100.0000 mL | INTRAVENOUS | Status: DC | PRN

## 2017-01-28 MED ORDER — CALCITRIOL 0.25 MCG PO CAPS
0.2500 ug | ORAL_CAPSULE | Freq: Every day | ORAL | Status: DC
  Administered 2017-01-28 – 2017-01-30 (×3): 0.25 ug via ORAL
  Filled 2017-01-28 (×3): qty 1

## 2017-01-28 MED ORDER — INSULIN ASPART 100 UNIT/ML ~~LOC~~ SOLN
0.0000 [IU] | Freq: Three times a day (TID) | SUBCUTANEOUS | Status: DC
  Administered 2017-01-28: 3 [IU] via SUBCUTANEOUS
  Administered 2017-01-28: 2 [IU] via SUBCUTANEOUS
  Administered 2017-01-29: 5 [IU] via SUBCUTANEOUS
  Administered 2017-01-30: 3 [IU] via SUBCUTANEOUS

## 2017-01-28 MED ORDER — VENLAFAXINE HCL ER 75 MG PO CP24
150.0000 mg | ORAL_CAPSULE | Freq: Every day | ORAL | Status: DC
  Administered 2017-01-28 – 2017-01-30 (×3): 150 mg via ORAL
  Filled 2017-01-28 (×5): qty 4

## 2017-01-28 MED ORDER — STROKE: EARLY STAGES OF RECOVERY BOOK
Freq: Once | Status: DC
  Filled 2017-01-28: qty 1

## 2017-01-28 MED ORDER — LIDOCAINE HCL (PF) 1 % IJ SOLN: 5.0000 mL | INTRAMUSCULAR | Status: DC | PRN

## 2017-01-28 MED ORDER — HEPARIN SODIUM (PORCINE) 1000 UNIT/ML IJ SOLN
INTRAMUSCULAR | Status: AC
  Administered 2017-01-28: 1000 [IU] via INTRAVENOUS_CENTRAL
  Filled 2017-01-28: qty 4

## 2017-01-28 MED ORDER — BOOST / RESOURCE BREEZE PO LIQD
1.0000 | Freq: Three times a day (TID) | ORAL | Status: DC
  Administered 2017-01-29 – 2017-01-30 (×3): 1 via ORAL

## 2017-01-28 MED ORDER — ATORVASTATIN CALCIUM 40 MG PO TABS
40.0000 mg | ORAL_TABLET | Freq: Every day | ORAL | Status: DC
  Administered 2017-01-28 – 2017-01-30 (×3): 40 mg via ORAL
  Filled 2017-01-28 (×3): qty 1

## 2017-01-28 MED ORDER — SENNOSIDES-DOCUSATE SODIUM 8.6-50 MG PO TABS: 1.0000 | ORAL_TABLET | Freq: Every evening | ORAL | Status: DC | PRN

## 2017-01-28 MED ORDER — ACETAMINOPHEN 160 MG/5ML PO SOLN: 650.0000 mg | ORAL | Status: DC | PRN

## 2017-01-28 MED ORDER — ACETAMINOPHEN 325 MG PO TABS
650.0000 mg | ORAL_TABLET | ORAL | Status: DC | PRN
  Administered 2017-01-30: 650 mg via ORAL
  Filled 2017-01-28: qty 2

## 2017-01-28 MED ORDER — ALTEPLASE 2 MG IJ SOLR
2.0000 mg | Freq: Once | INTRAMUSCULAR | Status: DC | PRN
  Filled 2017-01-28: qty 2

## 2017-01-28 MED ORDER — LORATADINE 10 MG PO TABS
10.0000 mg | ORAL_TABLET | Freq: Every day | ORAL | Status: DC
  Administered 2017-01-29 – 2017-01-30 (×2): 10 mg via ORAL
  Filled 2017-01-28 (×2): qty 1

## 2017-01-28 MED ORDER — LABETALOL HCL 5 MG/ML IV SOLN
5.0000 mg | INTRAVENOUS | Status: DC | PRN
  Administered 2017-01-28: 5 mg via INTRAVENOUS
  Filled 2017-01-28: qty 4

## 2017-01-28 MED ORDER — SODIUM CHLORIDE 0.9 % IV SOLN
100.0000 mL | INTRAVENOUS | Status: DC | PRN
Start: 2017-01-28 — End: 2017-01-30

## 2017-01-28 MED ORDER — HEPARIN SODIUM (PORCINE) 1000 UNIT/ML DIALYSIS
1000.0000 [IU] | INTRAMUSCULAR | Status: DC | PRN
  Administered 2017-01-28: 1000 [IU] via INTRAVENOUS_CENTRAL
  Filled 2017-01-28 (×2): qty 1

## 2017-01-28 MED ORDER — LIDOCAINE-PRILOCAINE 2.5-2.5 % EX CREA: 1.0000 "application " | TOPICAL_CREAM | CUTANEOUS | Status: DC | PRN

## 2017-01-28 MED ORDER — TRAMADOL HCL 50 MG PO TABS: 50.0000 mg | ORAL_TABLET | Freq: Two times a day (BID) | ORAL | Status: DC | PRN

## 2017-01-28 MED ORDER — PENTAFLUOROPROP-TETRAFLUOROETH EX AERO: 1.0000 "application " | INHALATION_SPRAY | CUTANEOUS | Status: DC | PRN

## 2017-01-28 MED ORDER — INSULIN ASPART 100 UNIT/ML ~~LOC~~ SOLN
0.0000 [IU] | Freq: Every day | SUBCUTANEOUS | Status: DC
  Administered 2017-01-29: 2 [IU] via SUBCUTANEOUS

## 2017-01-28 MED ORDER — BUDESONIDE 0.5 MG/2ML IN SUSP
0.5000 mg | Freq: Two times a day (BID) | RESPIRATORY_TRACT | Status: DC
  Administered 2017-01-28 – 2017-01-30 (×5): 0.5 mg via RESPIRATORY_TRACT
  Filled 2017-01-28 (×5): qty 2

## 2017-01-28 MED ORDER — HEPARIN SODIUM (PORCINE) 5000 UNIT/ML IJ SOLN
5000.0000 [IU] | Freq: Three times a day (TID) | INTRAMUSCULAR | Status: DC
  Administered 2017-01-28 – 2017-01-30 (×7): 5000 [IU] via SUBCUTANEOUS
  Filled 2017-01-28 (×7): qty 1

## 2017-01-28 MED ORDER — GUAIFENESIN-DM 100-10 MG/5ML PO SYRP: 5.0000 mL | ORAL_SOLUTION | ORAL | Status: DC | PRN

## 2017-01-28 MED ORDER — CLOPIDOGREL BISULFATE 75 MG PO TABS
75.0000 mg | ORAL_TABLET | Freq: Every day | ORAL | Status: DC
  Administered 2017-01-28 – 2017-01-30 (×3): 75 mg via ORAL
  Filled 2017-01-28 (×3): qty 1

## 2017-01-28 MED ORDER — PANTOPRAZOLE SODIUM 40 MG PO TBEC
40.0000 mg | DELAYED_RELEASE_TABLET | Freq: Two times a day (BID) | ORAL | Status: DC
  Administered 2017-01-28 – 2017-01-30 (×5): 40 mg via ORAL
  Filled 2017-01-28 (×6): qty 1

## 2017-01-28 MED ORDER — IPRATROPIUM-ALBUTEROL 0.5-2.5 (3) MG/3ML IN SOLN: 3.0000 mL | Freq: Four times a day (QID) | RESPIRATORY_TRACT | Status: DC | PRN

## 2017-01-28 NOTE — Evaluation (Signed)
Physical Therapy Evaluation Patient Details Name: Kelli [name] MRN: [id number] DOB: 1950/06/15 Today's Date: 01/28/2017   History of Present Illness  66 yo female with onset of SOB and weakness was admitted, has notable L hemi presentation with chronic O2 use with low CO2, low functional level at baseline.  PMHx:  stroke, CKD, CHF, DM, HTN, L humeral fracture, carotid occlusion, a-fib, low CO2.    Clinical Impression  Patient demonstrates slow labored movement for sitting up and during transfer to chair, required active assistance to complete LLE exercises and tolerated sitting up in chair with family members supervising after therapy.  Patient will benefit from continued physical therapy in hospital and recommended venue below to increase strength, balance, endurance for safe ADLs and gait.    Follow Up Recommendations SNF;Supervision/Assistance - 24 hour    Equipment Recommendations  None recommended by PT    Recommendations for Other Services       Precautions / Restrictions Precautions Precautions: Fall Restrictions Weight Bearing Restrictions: No      Mobility  Bed Mobility Overal bed mobility: Needs Assistance Bed Mobility: Supine to Sit;Sit to Supine     Supine to sit: Min assist Sit to supine: Min assist   General bed mobility comments: with head of bed raised  Transfers Overall transfer level: Needs assistance Equipment used: 1 person hand held assist Transfers: Sit to/from [facility] Sit to Stand: Mod assist Stand pivot transfers: Mod assist          Ambulation/Gait Ambulation/Gait assistance: Max assist Ambulation Distance (Feet): 2 Feet Assistive device: 1 person hand held assist Gait Pattern/deviations: Step-to pattern;Wide base of support;Trunk flexed;Shuffle;Decreased stride length Gait velocity: reduced Gait velocity interpretation: Below normal speed for age/gender General Gait Details: limited to 3-4 short unsteady steps due  to poor standing balance/LLE weakness  Stairs            Wheelchair Mobility    Modified Rankin (Stroke Patients Only)       Balance Overall balance assessment: Needs assistance Sitting-balance support: Feet supported;No upper extremity supported Sitting balance-Leahy Scale: Fair     Standing balance support: No upper extremity supported;During functional activity Standing balance-Leahy Scale: Poor Standing balance comment: increased trunk flexion in standing                             Pertinent Vitals/Pain Pain Assessment: No/denies pain    Home Living Family/patient expects to be discharged to:: Private residence Living Arrangements: Children Available Help at Discharge: Family Type of Home: House Home Access: Ramped entrance     Home Layout: Two level Home Equipment: Environmental consultant - 2 wheels;Bedside commode;Shower seat;Wheelchair - manual      Prior Function Level of Independence: Needs assistance   Gait / Transfers Assistance Needed: 1 person stand pivot with Min/mod assist  ADL's / Homemaking Assistance Needed: assisted by family        Hand Dominance        Extremity/Trunk Assessment   Upper Extremity Assessment Upper Extremity Assessment: Defer to OT evaluation    Lower Extremity Assessment RLE Deficits / Details: grossly -4/5 LLE Deficits / Details: grossly 2+/5, except left ankle dorsiflexion 0/5 LLE Coordination: decreased gross motor;decreased fine motor    Cervical / Trunk Assessment Cervical / Trunk Assessment: Normal  Communication   Communication: No difficulties  Cognition Arousal/Alertness: Awake/alert Behavior During Therapy: WFL for tasks assessed/performed Overall Cognitive Status: Within Functional Limits for tasks assessed  General Comments      Exercises General Exercises - Lower Extremity Ankle Circles/Pumps: Seated;AROM;AAROM;Strengthening;Both;10  reps Long Arc Quad: 10 reps;Both;AROM;AAROM;Seated;Strengthening Hip Flexion/Marching: Seated;AROM;AAROM;Strengthening;Both;10 reps   Assessment/Plan    PT Assessment Patient needs continued PT services  PT Problem List Decreased strength;Decreased activity tolerance;Decreased balance;Decreased mobility;Decreased coordination       PT Treatment Interventions Gait training;Functional mobility training;Therapeutic activities;Therapeutic exercise;Wheelchair mobility training;Patient/family education    PT Goals (Current goals can be found in the Care Plan section)  Acute Rehab PT Goals Patient Stated Goal: return home PT Goal Formulation: With patient/family Time For Goal Achievement: 02/04/17 Potential to Achieve Goals: Good    Frequency Min 5X/week   Barriers to discharge        Co-evaluation               AM-PAC PT "6 Clicks" Daily Activity  Outcome Measure Difficulty turning over in bed (including adjusting bedclothes, sheets and blankets)?: A Lot Difficulty moving from lying on back to sitting on the side of the bed? : A Lot Difficulty sitting down on and standing up from a chair with arms (e.g., wheelchair, bedside commode, etc,.)?: A Lot Help needed moving to and from a bed to chair (including a wheelchair)?: A Lot Help needed walking in hospital room?: Total Help needed climbing 3-5 steps with a railing? : Total 6 Click Score: 10    End of Session   Activity Tolerance: Patient limited by fatigue;Patient tolerated treatment well Patient left: in chair;with call bell/phone within reach;with family/visitor present Nurse Communication: Mobility status PT Visit Diagnosis: Unsteadiness on feet (R26.81);Other abnormalities of gait and mobility (R26.89);Muscle weakness (generalized) (M62.81) Hemiplegia - Right/Left: Left Hemiplegia - dominant/non-dominant: Non-dominant Hemiplegia - caused by: Unspecified    Time: 0940-1004 PT Time Calculation (min) (ACUTE ONLY):  24 min   Charges:   PT Evaluation $PT Eval Moderate Complexity: 1 Mod PT Treatments $Therapeutic Activity: 23-37 mins   PT G Codes:   PT G-Codes **NOT FOR INPATIENT CLASS** Functional Assessment Tool Used: AM-PAC 6 Clicks Basic Mobility Functional Limitation: Mobility: Walking and moving around Mobility: Walking and Moving Around Current Status (V3710): At least 60 percent but less than 80 percent impaired, limited or restricted Mobility: Walking and Moving Around Goal Status [phone]): At least 60 percent but less than 80 percent impaired, limited or restricted Mobility: Walking and Moving Around Discharge Status [phone]): At least 60 percent but less than 80 percent impaired, limited or restricted    2:29 PM, 01/28/17 Lonell Grandchild, MPT Physical Therapist with [facility] [phone] office [phone] mobile phone

## 2017-01-28 NOTE — Progress Notes (Signed)
Patient admitted to the hospital earlier this morning by Dr. Myna Hidalgo.  Patient seen and examined.  She is currently on dialysis.  Denies any chest pain or shortness of breath.  Overall weakness in right hand is better.  Patient was discharged from the hospital yesterday after a lengthy hospital stay.  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  She had significant GI bleeding requiring transfusion of 6 units of PRBCs during her hospital stay.  EGD had indicated gastritis/duodenitis.  She has chronic atrial fibrillation.  Antiplatelet/anticoagulation was felt to be high risk during that admission due to risk of bleeding.  It was felt that she may be able to start Plavix in the next week if hemoglobin remained stable.  The patient returned to the hospital the following day with right hand weakness and vision changes.  It was felt that she may be having a TIA.  GI was consulted and felt that Plavix may be reasonable at this time.  Neurology is also seen the patient and agrees with Plavix.  If hemoglobin remained stable in the next 2 weeks, she may be considered to start on anticoagulation in place of Plavix.  In any case, hemoglobin will have to be watched closely.  She is undergoing dialysis per nephrology.  Anticipate discharge in next 24 hours if she remains stable.  [employer]

## 2017-01-28 NOTE — Progress Notes (Signed)
SLP Cancellation Note  Patient Details Name: SHAIMA SARDINAS MRN: [id number] DOB: Feb 18, 1951   Cancelled treatment:        Pt screened and is at cognitive baseline -- MRI reveals "Negative for acute infarct". All symptoms have resolved. No further ST needs noted at this time.  Estoria Geary H. Roddie Mc, CCC-SLP Speech Language Pathologist    Wende Bushy 01/28/2017, 3:15 PM

## 2017-01-28 NOTE — H&P (Signed)
History and Physical    Kelli [name]:[id number] DOB: 1950/04/20 DOA: 01/27/2017  PCP: Manon Hilding, MD   Patient coming from: SNF  Chief Complaint: Seeing black dots; right hand weakness  HPI: Kelli [name] is a 66 y.o. female with medical history significant for end-stage renal disease, chronic diastolic CHF, insulin-dependent diabetes mellitus, hypertension, and history of CVA with residual left-sided deficits, now presenting to the emergency department from her SNF for evaluation of a visual disturbance and right hand weakness.  Patient was just discharged from the hospital yesterday after a 3-week admission for acute CHF, complicated by development of ESRD, possible acute stroke, and occult GI bleeding with anemia requiring transfusion of multiple units of PRBCs.  Patient was discharged to the SNF yesterday in improved and stable condition, but overnight, she noted "black dots" in her vision and increased weakness involving her right hand.  Denies any fall or trauma and denies headache or numbness.  Denies recent fevers or chills, but reports increased dyspnea for 1-2 days.   ED Course: Upon arrival to the ED, patient is found to be afebrile, saturating well on room air, and with vitals otherwise stable.  Chest x-ray is notable for stable cardiomegaly and diffuse interstitial prominence, possibly representing edema or atypical infection.  Chemistry panel is notable for a glucose of 204 and BUN of 35.  CBC features a normocytic anemia with hemoglobin of 8.7, troponin is slightly elevated to 0.04, and BNP is elevated to 1264.  Noncontrast head CT is negative for acute intracranial abnormality, but notable for old strokes.  Neurology was consulted by ED physician and recommended medical admission for repeat MRI and consultation with gastroenterology for guidance on anticoagulation or antiplatelet therapy given the patient's history of atrial fibrillation with suspected recurrent CVA, and occult  GI bleed recently requiring transfusions.  Review of Systems:  All other systems reviewed and apart from HPI, are negative.  Past Medical History:  Diagnosis Date  . Carotid artery occlusion    Occluded RICA, status post left CEA  August 2014 - Dr. Donnetta Hutching  . Cerebral infarction [facility]) Aug 2014   Bihemispheric watershed infarcts  . Cerebral infarction involving left cerebellar artery [facility]) Feb 2015  . CKD (chronic kidney disease) stage 3, GFR 30-59 ml/min (HCC)   . Closed dislocation of left humerus 07/26/2013  . DM (diabetes mellitus), type 2 ([place])   . Essential hypertension, benign   . Fibromyalgia   . Mixed hyperlipidemia   . Multiple gastric ulcers   . Urinary incontinence     Past Surgical History:  Procedure Laterality Date  . BIOPSY  01/22/2017   Procedure: BIOPSY;  Surgeon: Danie Binder, MD;  [facility];  Service: Endoscopy;;  gastric  . COMBINED HYSTERECTOMY VAGINAL W/ MMK / A&P REPAIR  1981  . ENDARTERECTOMY Left 10/06/2012   Procedure: Carotid Endarterectomy with Finesse patch angioplasty;  Surgeon: [name], MD;  Location: [place];  Service: Vascular;  Laterality: Left;  . ESOPHAGOGASTRODUODENOSCOPY N/A 04/26/2014   Procedure: ESOPHAGOGASTRODUODENOSCOPY (EGD);  Surgeon: Lear Ng, MD;  Location: [facility] ENDOSCOPY;  Service: Endoscopy;  Laterality: N/A;  . ESOPHAGOGASTRODUODENOSCOPY N/A 01/22/2017   Procedure: ESOPHAGOGASTRODUODENOSCOPY (EGD);  Surgeon: Danie Binder, MD;  [facility];  Service: Endoscopy;  Laterality: N/A;  . FLEXIBLE SIGMOIDOSCOPY N/A 01/22/2017   Procedure: FLEXIBLE SIGMOIDOSCOPY;  Surgeon: Danie Binder, MD;  [facility];  Service: Endoscopy;  Laterality: N/A;  . IR FLUORO GUIDE CV  LINE LEFT  01/14/2017  . IR US GUIDE VASC ACCESS LEFT  01/14/2017  . LOOP RECORDER IMPLANT  04/16/13   MDT LinQ implanted for cryptogenic stroke  . [name] WITHOUT CARDIOVERSION N/A 04/16/2013   Procedure: TRANSESOPHAGEAL  ECHOCARDIOGRAM ([name]);  Surgeon: Josue Hector, MD;  Location: [facility] ENDOSCOPY;  Service: Cardiovascular;  Laterality: N/A;  . URETHRAL DILATION  1980's  . VAGINAL HYSTERECTOMY  1981   "partial" (10/04/2012)     reports that she quit smoking about 4 years ago. Her smoking use included cigarettes. She has a 44.00 pack-year smoking history. she has never used smokeless tobacco. She reports that she does not drink alcohol or use drugs.  Allergies  Allergen Reactions  . Hydrochlorothiazide Nausea And Vomiting    Family History  Problem Relation Age of Onset  . Diabetes Mother   . Diabetes Father   . Hypertension Sister   . Diabetes Brother   . Seizures Son   . Kidney disease Maternal Grandmother   . Hypertension Maternal Grandmother   . Heart disease Maternal Grandfather   . Diabetes Paternal Grandmother   . Heart disease Paternal Grandfather      Prior to Admission medications   Medication Sig Start Date End Date Taking? Authorizing Provider  atorvastatin (LIPITOR) 40 MG tablet Take 1 tablet (40 mg total) by mouth daily at 6 PM. 10/20/16   Cherene Altes, MD  budesonide (PULMICORT) 0.5 MG/2ML nebulizer solution Take 2 mLs (0.5 mg total) by nebulization 2 (two) times daily. 01/27/17   Kathie Dike, MD  calcitRIOL (ROCALTROL) 0.25 MCG capsule Take 1 capsule (0.25 mcg total) by mouth daily. 10/20/16   Cherene Altes, MD  epoetin alfa (EPOGEN,PROCRIT) 99357 UNIT/ML injection Inject 1 mL (10,000 Units total) into the vein Every Tuesday,Thursday,and Saturday with dialysis. 01/29/17   Kathie Dike, MD  feeding supplement (BOOST / RESOURCE BREEZE) LIQD Take 1 Container by mouth 3 (three) times daily between meals. 01/27/17   Kathie Dike, MD  guaiFENesin-dextromethorphan (ROBITUSSIN DM) 100-10 MG/5ML syrup Take 5 mLs by mouth every 4 (four) hours as needed for cough. 01/27/17   Kathie Dike, MD  hydrALAZINE (APRESOLINE) 50 MG tablet Take 1 tablet (50 mg total) by mouth 3 (three)  times daily. 10/20/16   Cherene Altes, MD  insulin aspart (NOVOLOG) 100 UNIT/ML injection Inject 0-15 Units into the skin 3 (three) times daily with meals. 01/19/17   Reyne Dumas, MD  ipratropium-albuterol (DUONEB) 0.5-2.5 (3) MG/3ML SOLN Take 3 mLs by nebulization every 6 (six) hours as needed. 01/19/17   Reyne Dumas, MD  loratadine (CLARITIN) 10 MG tablet Take 1 tablet (10 mg total) by mouth daily. 01/20/17   Reyne Dumas, MD  metoprolol tartrate (LOPRESSOR) 50 MG tablet Take 1 tablet (50 mg total) by mouth 2 (two) times daily. 01/27/17   Kathie Dike, MD  pantoprazole (PROTONIX) 40 MG tablet Take 1 tablet (40 mg total) by mouth 2 (two) times daily before a meal. 01/27/17   Kathie Dike, MD  traMADol (ULTRAM) 50 MG tablet Take 1 tablet (50 mg total) by mouth every 12 (twelve) hours as needed for moderate pain. 01/19/17   Reyne Dumas, MD  venlafaxine XR (EFFEXOR-XR) 150 MG 24 hr capsule Take 150 mg by mouth daily with breakfast.    [provider]    Physical Exam: Vitals:   01/27/17 2227 01/27/17 2229 01/28/17 0122  BP:  (!) 178/70 (!) 173/64  Pulse:  95 93  Resp:  19 19  Temp:  98.8 F (37.1 C)   TempSrc:  Oral   SpO2:  96% 93%  Weight: 87.1 kg (192 lb)    Height: 5\' 5"  (1.651 m)        Constitutional: NAD, calm, appears uncomfortable Eyes: PERTLA, lids and conjunctivae normal ENMT: Mucous membranes are moist. Posterior pharynx clear of any exudate or lesions.   Neck: normal, supple, no masses, no thyromegaly Respiratory: Bilateral rales. Normal respiratory effort. No accessory muscle use.  Cardiovascular: S1 & S2 heard, regular rate and rhythm. No significant JVD. Abdomen: No distension, no tenderness, no masses palpated. Bowel sounds normal.  Musculoskeletal: no clubbing / cyanosis. No joint deformity upper and lower extremities.  Skin: no significant rashes, lesions, ulcers. Warm, dry, well-perfused. Neurologic: CN 2-12 grossly intact. Sensation  intact. Left-sided hemiparesis.   Psychiatric: Alert and oriented x 3. Calm, cooperative.     Labs on Admission: I have personally reviewed following labs and imaging studies  CBC: Recent Labs  Lab 01/25/17 0555 01/25/17 1533 01/26/17 0613 01/27/17 0452 01/27/17 2340  WBC 7.1 6.6 6.4 7.5 6.8  NEUTROABS  --   --   --   --  4.8  HGB 8.7* 8.7* 7.9* 8.0* 8.7*  HCT 27.8* 27.4* 25.4* 25.8* 27.6*  MCV 93.3 91.9 92.7 92.8 93.2  PLT 147* 138* 141* 159 951   Basic Metabolic Panel: Recent Labs  Lab 01/21/17 0451 01/22/17 0443  01/25/17 0555 01/25/17 1533 01/26/17 0613 01/27/17 0452 01/27/17 2340  NA 133* 137   < > 139 138 138 137 135  K 3.8 3.6   < > 4.0 3.2* 3.7 4.1 4.1  CL 97* 99*   < > 105 102 102 101 98*  CO2 28 28   < > 23 28 27 25 27   GLUCOSE 122* 162*   < > 142* 214* 142* 129* 204*  BUN 39* 26*   < > 68* 28* 50* 63* 35*  CREATININE 3.45* 2.81*   < > 5.00* 2.48* 3.78* 4.55* 3.15*  CALCIUM 6.8* 7.7*   < > 7.0* 7.3* 6.8* 6.7* 7.0*  PHOS 2.5 2.5  --  4.6 2.6 4.2  --   --    < > = values in this interval not displayed.   GFR: Estimated Creatinine Clearance: 19.1 mL/min (A) (by C-G formula based on SCr of 3.15 mg/dL (H)). Liver Function Tests: Recent Labs  Lab 01/22/17 0443 01/25/17 0555 01/25/17 1533 01/26/17 0613 01/27/17 2340  AST  --   --   --   --  16  ALT  --   --   --   --  10*  ALKPHOS  --   --   --   --  70  BILITOT  --   --   --   --  0.5  PROT  --   --   --   --  5.8*  ALBUMIN 3.0* 2.7* 2.7* 2.5* 2.9*   No results for input(s): LIPASE, AMYLASE in the last 168 hours. No results for input(s): AMMONIA in the last 168 hours. Coagulation Profile: No results for input(s): INR, PROTIME in the last 168 hours. Cardiac Enzymes: Recent Labs  Lab 01/27/17 2340  TROPONINI 0.04*   BNP (last 3 results) No results for input(s): PROBNP in the last 8760 hours. HbA1C: No results for input(s): HGBA1C in the last 72 hours. CBG: Recent Labs  Lab 01/26/17 1608  01/26/17 2214 01/27/17 0733 01/27/17 1114 01/27/17 1623  GLUCAP 150* 140* 116* 118* 169*  Lipid Profile: No results for input(s): CHOL, HDL, LDLCALC, TRIG, CHOLHDL, LDLDIRECT in the last 72 hours. Thyroid Function Tests: No results for input(s): TSH, T4TOTAL, FREET4, T3FREE, THYROIDAB in the last 72 hours. Anemia Panel: No results for input(s): VITAMINB12, FOLATE, FERRITIN, TIBC, IRON, RETICCTPCT in the last 72 hours. Urine analysis:    Component Value Date/Time   COLORURINE YELLOW 01/10/2017 1150   APPEARANCEUR CLOUDY (A) 01/10/2017 1150   LABSPEC 1.011 01/10/2017 1150   PHURINE 7.0 01/10/2017 1150   GLUCOSEU NEGATIVE 01/10/2017 1150   HGBUR NEGATIVE 01/10/2017 1150   BILIRUBINUR NEGATIVE 01/10/2017 1150   KETONESUR NEGATIVE 01/10/2017 1150   PROTEINUR 100 (A) 01/10/2017 1150   UROBILINOGEN 0.2 04/25/2014 2315   NITRITE NEGATIVE 01/10/2017 1150   LEUKOCYTESUR MODERATE (A) 01/10/2017 1150   Sepsis Labs: @LABRCNTIP (procalcitonin:4,lacticidven:4) ) Recent Results (from the past 240 hour(s))  MRSA PCR Screening     Status: None   Collection Time: 01/22/17  7:21 PM  Result Value Ref Range Status   MRSA by PCR NEGATIVE NEGATIVE Final    Comment:        The GeneXpert MRSA Assay (FDA approved for NASAL specimens only), is one component of a comprehensive MRSA colonization surveillance program. It is not intended to diagnose MRSA infection nor to guide or monitor treatment for MRSA infections.      Radiological Exams on Admission: Dg Chest 1 View  Result Date: 01/28/2017 CLINICAL DATA:  Cough for 2 days. Shortness of breath. Former smoker. EXAM: CHEST 1 VIEW COMPARISON:  Chest radiograph January 22, 2017 FINDINGS: Stable cardiomegaly. Calcified aortic knob. Diffuse interstitial prominence. Blunting of LEFT costophrenic angle. No focal consolidation. No pneumothorax. Loop monitor projects in LEFT chest. Interval removal of RIGHT IJ venous catheter. Unchanged position of  LEFT tunneled subclavian dialysis catheter with distal tip projecting mid superior vena cava. Soft tissue planes and included osseous structures are nonsuspicious. IMPRESSION: Stable cardiomegaly. Diffuse interstitial prominence seen with pulmonary edema and atypical infection without focal consolidation. Trace LEFT pleural effusion. Interval removal of RIGHT IJ catheter, stable appearance of LEFT dialysis catheter. Aortic Atherosclerosis (ICD10-I70.0). Electronically Signed   By: Elon Alas M.D.   On: 01/28/2017 00:46   Ct Head Wo Contrast  Result Date: 01/28/2017 CLINICAL DATA:  Recurrent vision changes and RIGHT hand stiffness. Discharged from hospital today for CHF. History of RIGHT carotid artery occlusion, hypertension, diabetes. EXAM: CT HEAD WITHOUT CONTRAST TECHNIQUE: Contiguous axial images were obtained from the base of the skull through the vertex without intravenous contrast. COMPARISON:  MRI of the head January 21, 2017 and CT HEAD January 19, 2017 FINDINGS: BRAIN: No intraparenchymal hemorrhage, mass effect nor midline shift. RIGHT mesial temporal occipital lobe encephalomalacia. RIGHT frontoparietal encephalomalacia. Multifocal small areas of LEFT parietal lobe encephalomalacia Confluent supratentorial pontine white matter FLAIR T2 hyperintensities. Asymmetrically smaller RIGHT cerebral peduncle consistent with wallerian degeneration. Hypodensities bilateral basal ganglia and thalamus associated with chronic small vessel ischemic disease. Old small LEFT cerebellar infarct. No abnormal extra-axial fluid collections. Ex vacuo dilatation RIGHT lateral ventricle, mild parenchymal brain volume loss. No hydrocephalus. VASCULAR: Mild to moderate calcific atherosclerosis of the carotid siphons. SKULL: No skull fracture. No significant scalp soft tissue swelling. SINUSES/ORBITS: The mastoid air-cells and included paranasal sinuses are well-aerated.The included ocular globes and orbital contents  are non-suspicious. Status post bilateral ocular lens implants. OTHER: None. IMPRESSION: 1. No acute intracranial process. 2. Old bilateral MCA and RIGHT PCA territory infarcts. Old small LEFT cerebellar infarct. 3. Severe chronic small vessel ischemic disease.  4. Mild parenchymal brain volume loss for age. Electronically Signed   By: Elon Alas M.D.   On: 01/28/2017 00:52    EKG: Ordered and pending.   Assessment/Plan  1. Acute visual disturbance and right hand weakness; hx of CVA   - Pt presents from SNF with acute visual disturbance and right hand weakness  - Head CT negative for acute intracranial abnormality  - She has residual left-sided deficits from remote CVA - Neurology was consulted by EDP, recommended medical admission for MRI brain and GI consultation for ?anticoagulation, or ASA and/or Plavix in light of a fib with CVA's and occult GIB  - Plan to continue cardiac monitoring, frequent neuro checks, PT/OT/SLP evals, MRI brain; lipid panel, A1c, echo, and carotid US all performed recently, will hold off on repeating for now     2. Atrial fibrillation  - In a regular rhythm on admission  - CHADS-VASc at least 4 (age, CHF, DM, HTN)  - Not on anticoagulation secondary to occult GI bleeding recently requiring transfusions  - Metoprolol held on admission while evaluating for possible acute ischemic CVA, will continue cardiac monitoring and start a rate-control agent as needed     3. Anemia; occult GI blood-loss  - Hgb is 8.7 on admission, improved from recent admission  - Underwent colonoscopy and EGD during recent admission, noted to have gastritis/duodenitis, and was started on BID Protonix  - Continue Protonix BID  - ?whether she could tolerate ASA and/or Plavix given concern for recurrent CVAs/TIAs; GI consultation requested    4. ESRD; pulmonary edema  - Pt has recently progressed to ESRD and has TDC in left chest  - She received HD on 11/29, has pulmonary edema on CXR  and is a little dyspneic with speech  - No acidosis, hyperkalemia, or uremia on admission  - Nephrology consultation requested   5. Elevated troponin - Troponin elevated slightly to 0.04 on admission without any anginal complaints  - Continue cardiac monitoring, trend troponin    6. Hypertension  - BP labile in ED  - Hydralazine and metoprolol held while evaluating for acute CVA - Use labetalol IVP's prn SBP >210 or DBP >110 initially    7. Type II DM  - A1c was only 5.1% one week ago  - Managed at SNF with Novolog per sliding-scale  - Check CBG's and continue SSI with Novolog   DVT prophylaxis: sq heparin  Code Status: DNR Family Communication: Discussed with patient Disposition Plan: Observe on telemetry Consults called: Neurology Admission status: Observation    Vianne Bulls, MD [facility] Pager [phone]  If 7PM-7AM, please contact night-coverage [email or web address] Password [email or web address]  01/28/2017, 2:10 AM

## 2017-01-28 NOTE — Consult Note (Signed)
Referring Provider: [facility] Primary Care Physician:  Manon Hilding, MD Primary Gastroenterologist:  Dr. Gala Romney  Date of Admission: 01/27/17 Date of Consultation: 01/28/17  Reason for Consultation:  Anticoagulation guidance  HPI:  Kelli [name] is a 66 y.o. female with a past medical history of end-stage renal disease, chronic diastolic CHF, insulin-dependent diabetes, hypertension, history of CVA with residual left-sided deficits, recent GI bleed likely due to gastritis on NSAIDs requiring 6 units of PRBCs for hemoglobin of around 7.5.  The patient discharged yesterday after 3-week admission for CHF complicated by end-stage renal disease, possible acute stroke, and occult GI bleed with anemia requiring transfusion.  She was discharged to the sniff and and returned later that evening due to visual disturbances and right hand weakness felt to be possible TIA.  In the emergency room vitals are stable, chest x-ray stable, CBC with normocytic anemia with a hemoglobin of 8.7, BNP elevated at 1264.  Head CT negative for acute abnormality but notable for old strokes.  Neurology was consulted and recommended admission for repeat MRI and consultation of GI for guidance with anticoagulation or antiplatelet therapy.  During her previous admission colonoscopy was unable to be completed due to unable to prep.  Upper endoscopy was completed and found normal esophagus, medium size hiatal hernia, moderate to severe gastritis and duodenitis likely due to NSAIDs which was a partial to major contributor to her anemia.  Pathology demonstrated gastritis due to aspirin use.  Recommended stop aspirin and avoid NSAIDs.  Noted that she could use Plavix.  Protonix twice daily for 3 months then once daily moving forward.  Last progress note during her previous admission noted consider benefits versus risks of Plavix.  Today she states she started having weakness and dark spots at the nursing home. No bowel movement  in 2-3 days. Last bowel movement was with colonoscopy prep during last admission, which was darkl, but no hematochezia or melena since then. Denies abdominal pain. No N/V today. No other GI concerns at this time.   Past Medical History:  Diagnosis Date  . Carotid artery occlusion    Occluded RICA, status post left CEA  August 2014 - Dr. Donnetta Hutching  . Cerebral infarction [facility]) Aug 2014   Bihemispheric watershed infarcts  . Cerebral infarction involving left cerebellar artery [name]) Feb 2015  . CKD (chronic kidney disease) stage 3, GFR 30-59 ml/min (HCC)   . Closed dislocation of left humerus 07/26/2013  . DM (diabetes mellitus), type 2 ([place])   . Essential hypertension, benign   . Fibromyalgia   . Mixed hyperlipidemia   . Multiple gastric ulcers   . Urinary incontinence     Past Surgical History:  Procedure Laterality Date  . BIOPSY  01/22/2017   Procedure: BIOPSY;  Surgeon: Danie Binder, MD;  [facility];  Service: Endoscopy;;  gastric  . COMBINED HYSTERECTOMY VAGINAL W/ MMK / A&P REPAIR  1981  . ENDARTERECTOMY Left 10/06/2012   Procedure: Carotid Endarterectomy with Finesse patch angioplasty;  Surgeon: [name], MD;  Location: [place];  Service: Vascular;  Laterality: Left;  . ESOPHAGOGASTRODUODENOSCOPY N/A 04/26/2014   Procedure: ESOPHAGOGASTRODUODENOSCOPY (EGD);  Surgeon: Lear Ng, MD;  Location: [facility];  Service: Endoscopy;  Laterality: N/A;  . ESOPHAGOGASTRODUODENOSCOPY N/A 01/22/2017   Procedure: ESOPHAGOGASTRODUODENOSCOPY (EGD);  Surgeon: Danie Binder, MD;  Location: [facility];  Service: Endoscopy;  Laterality: N/A;  . FLEXIBLE SIGMOIDOSCOPY N/A 01/22/2017   Procedure: FLEXIBLE SIGMOIDOSCOPY;  Surgeon: Danie Binder,  MD;  Location: [facility];  Service: Endoscopy;  Laterality: N/A;  . IR FLUORO GUIDE CV LINE LEFT  01/14/2017  . IR US GUIDE VASC ACCESS LEFT  01/14/2017  . LOOP RECORDER IMPLANT  04/16/13   MDT LinQ implanted for cryptogenic  stroke  . [name] WITHOUT CARDIOVERSION N/A 04/16/2013   Procedure: TRANSESOPHAGEAL ECHOCARDIOGRAM ([name]);  Surgeon: Josue Hector, MD;  Location: [facility] ENDOSCOPY;  Service: Cardiovascular;  Laterality: N/A;  . URETHRAL DILATION  1980's  . VAGINAL HYSTERECTOMY  1981   "partial" (10/04/2012)    Prior to Admission medications   Medication Sig Start Date End Date Taking? Authorizing Provider  atorvastatin (LIPITOR) 40 MG tablet Take 1 tablet (40 mg total) by mouth daily at 6 PM. 10/20/16   Cherene Altes, MD  budesonide (PULMICORT) 0.5 MG/2ML nebulizer solution Take 2 mLs (0.5 mg total) by nebulization 2 (two) times daily. 01/27/17   Kathie Dike, MD  calcitRIOL (ROCALTROL) 0.25 MCG capsule Take 1 capsule (0.25 mcg total) by mouth daily. 10/20/16   Cherene Altes, MD  epoetin alfa (EPOGEN,PROCRIT) 42876 UNIT/ML injection Inject 1 mL (10,000 Units total) into the vein Every Tuesday,Thursday,and Saturday with dialysis. 01/29/17   Kathie Dike, MD  feeding supplement (BOOST / RESOURCE BREEZE) LIQD Take 1 Container by mouth 3 (three) times daily between meals. 01/27/17   Kathie Dike, MD  guaiFENesin-dextromethorphan (ROBITUSSIN DM) 100-10 MG/5ML syrup Take 5 mLs by mouth every 4 (four) hours as needed for cough. 01/27/17   Kathie Dike, MD  hydrALAZINE (APRESOLINE) 50 MG tablet Take 1 tablet (50 mg total) by mouth 3 (three) times daily. 10/20/16   Cherene Altes, MD  insulin aspart (NOVOLOG) 100 UNIT/ML injection Inject 0-15 Units into the skin 3 (three) times daily with meals. 01/19/17   Reyne Dumas, MD  ipratropium-albuterol (DUONEB) 0.5-2.5 (3) MG/3ML SOLN Take 3 mLs by nebulization every 6 (six) hours as needed. 01/19/17   Reyne Dumas, MD  loratadine (CLARITIN) 10 MG tablet Take 1 tablet (10 mg total) by mouth daily. 01/20/17   Reyne Dumas, MD  metoprolol tartrate (LOPRESSOR) 50 MG tablet Take 1 tablet (50 mg total) by mouth 2 (two) times daily. 01/27/17   Kathie Dike, MD   pantoprazole (PROTONIX) 40 MG tablet Take 1 tablet (40 mg total) by mouth 2 (two) times daily before a meal. 01/27/17   Kathie Dike, MD  traMADol (ULTRAM) 50 MG tablet Take 1 tablet (50 mg total) by mouth every 12 (twelve) hours as needed for moderate pain. 01/19/17   Reyne Dumas, MD  venlafaxine XR (EFFEXOR-XR) 150 MG 24 hr capsule Take 150 mg by mouth daily with breakfast.    [provider]    Current Facility-Administered Medications  Medication Dose Route Frequency Provider Last Rate Last Dose  .  stroke: mapping our early stages of recovery book   Does not apply Once Opyd, Ilene Qua, MD      . acetaminophen (TYLENOL) tablet 650 mg  650 mg Oral Q4H PRN Opyd, Ilene Qua, MD       Or  . acetaminophen (TYLENOL) solution 650 mg  650 mg Per Tube Q4H PRN Opyd, Ilene Qua, MD       Or  . acetaminophen (TYLENOL) suppository 650 mg  650 mg Rectal Q4H PRN Opyd, Ilene Qua, MD      . atorvastatin (LIPITOR) tablet 40 mg  40 mg Oral q1800 Opyd, Timothy S, MD      . budesonide (PULMICORT) nebulizer solution 0.5 mg  0.5 mg Nebulization BID Vianne Bulls, MD   0.5 mg at 01/28/17 0733  . calcitRIOL (ROCALTROL) capsule 0.25 mcg  0.25 mcg Oral Daily Opyd, Ilene Qua, MD      . feeding supplement (BOOST / RESOURCE BREEZE) liquid 1 Container  1 Container Oral TID BM Opyd, Ilene Qua, MD      . guaiFENesin-dextromethorphan (ROBITUSSIN DM) 100-10 MG/5ML syrup 5 mL  5 mL Oral Q4H PRN Opyd, Ilene Qua, MD      . heparin injection 5,000 Units  5,000 Units Subcutaneous Q8H Opyd, Timothy S, MD      . insulin aspart (novoLOG) injection 0-15 Units  0-15 Units Subcutaneous TID WC Opyd, Ilene Qua, MD   2 Units at 01/28/17 0804  . insulin aspart (novoLOG) injection 0-5 Units  0-5 Units Subcutaneous QHS Opyd, Timothy S, MD      . ipratropium-albuterol (DUONEB) 0.5-2.5 (3) MG/3ML nebulizer solution 3 mL  3 mL Nebulization Q6H PRN Opyd, Ilene Qua, MD      . labetalol (NORMODYNE,TRANDATE) injection 5-10 mg  5-10  mg Intravenous Q2H PRN Opyd, Ilene Qua, MD   5 mg at 01/28/17 0748  . loratadine (CLARITIN) tablet 10 mg  10 mg Oral Daily Opyd, Ilene Qua, MD      . pantoprazole (PROTONIX) EC tablet 40 mg  40 mg Oral BID AC Opyd, Ilene Qua, MD      . senna-docusate (Senokot-S) tablet 1 tablet  1 tablet Oral QHS PRN Opyd, Ilene Qua, MD      . traMADol (ULTRAM) tablet 50 mg  50 mg Oral Q12H PRN Opyd, Ilene Qua, MD      . venlafaxine XR (EFFEXOR-XR) 24 hr capsule 150 mg  150 mg Oral Q breakfast Opyd, Ilene Qua, MD        Allergies as of 01/27/2017 - Review Complete 01/27/2017  Allergen Reaction Noted  . Hydrochlorothiazide Nausea And Vomiting 10/03/2012    Family History  Problem Relation Age of Onset  . Diabetes Mother   . Diabetes Father   . Hypertension Sister   . Diabetes Brother   . Seizures Son   . Kidney disease Maternal Grandmother   . Hypertension Maternal Grandmother   . Heart disease Maternal Grandfather   . Diabetes Paternal Grandmother   . Heart disease Paternal Grandfather     Social History   Socioeconomic History  . Marital status: Married    Spouse name: Not on file  . Number of children: Not on file  . Years of education: Not on file  . Highest education level: Not on file  Social Needs  . Financial resource strain: Not on file  . Food insecurity - worry: Not on file  . Food insecurity - inability: Not on file  . Transportation needs - medical: Not on file  . Transportation needs - non-medical: Not on file  Occupational History  . Not on file  Tobacco Use  . Smoking status: Former Smoker    Packs/day: 1.00    Years: 44.00    Pack years: 44.00    Types: Cigarettes    Last attempt to quit: 10/03/2012    Years since quitting: 4.3  . Smokeless tobacco: Never Used  Substance and Sexual Activity  . Alcohol use: No    Alcohol/week: 0.0 oz  . Drug use: No  . Sexual activity: Yes    Birth control/protection: Surgical  Other Topics Concern  . 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    Review of Systems: General: Negative for anorexia, weight loss, fever, chills, fatigue, weakness. ENT: Negative for hoarseness, difficulty swallowing , nasal congestion. CV: Negative for chest pain, angina, palpitations, dyspnea on exertion, peripheral edema.  Respiratory: Negative for dyspnea at rest, dyspnea on exertion, cough, sputum, wheezing.  GI: See history of present illness. Neuro: Admits weakness.  Endo: Negative for unusual weight change.  Heme: Negative for bruising or bleeding. Allergy: Negative for rash or hives.  Physical Exam: Vital signs in last 24 hours: Temp:  [98 F (36.7 C)-98.9 F (37.2 C)] 98.9 F (37.2 C) (11/30 0700) Pulse Rate:  [76-102] 102 (11/30 0700) Resp:  [14-20] 14 (11/30 0700) BP: (143-198)/(58-91) 198/77 (11/30 0700) SpO2:  [93 %-99 %] 97 % (11/30 0733) Weight:  [181 lb 3.5 oz (82.2 kg)-192 lb (87.1 kg)] 181 lb 3.5 oz (82.2 kg) (11/30 0300)   General:   Alert,  Well-developed, well-nourished, pleasant and cooperative in NAD Head:  Normocephalic and atraumatic. Eyes:  Sclera clear, no icterus. Conjunctiva pink. Ears:  Normal auditory acuity. Neck:  Supple; no masses or thyromegaly. Lungs:  Noted bilateral course expiratory wheezes. Intermittent cough during visit. No wheezes, crackles, or rhonchi. No acute distress. Heart:  Regular rate and rhythm; no murmurs, clicks, rubs,  or gallops. Abdomen:  Rounded but soft, nontender and nondistended. No masses, hepatosplenomegaly or hernias noted. Normal bowel sounds, without guarding, and without rebound.   Rectal:  Deferred.   Msk:  Symmetrical without gross deformities. Neurologic:  Alert and  oriented x4;  grossly normal neurologically. Psych:  Alert and cooperative. Normal mood and affect.  Intake/Output from previous day: No intake/output data  recorded. Intake/Output this shift: No intake/output data recorded.  Lab Results: Recent Labs    01/26/17 0613 01/27/17 0452 01/27/17 2340  WBC 6.4 7.5 6.8  HGB 7.9* 8.0* 8.7*  HCT 25.4* 25.8* 27.6*  PLT 141* 159 179   BMET Recent Labs    01/26/17 0613 01/27/17 0452 01/27/17 2340  NA 138 137 135  K 3.7 4.1 4.1  CL 102 101 98*  CO2 27 25 27   GLUCOSE 142* 129* 204*  BUN 50* 63* 35*  CREATININE 3.78* 4.55* 3.15*  CALCIUM 6.8* 6.7* 7.0*   LFT Recent Labs    01/25/17 1533 01/26/17 0613 01/27/17 2340  PROT  --   --  5.8*  ALBUMIN 2.7* 2.5* 2.9*  AST  --   --  16  ALT  --   --  10*  ALKPHOS  --   --  70  BILITOT  --   --  0.5   PT/INR No results for input(s): LABPROT, INR in the last 72 hours. Hepatitis Panel No results for input(s): HEPBSAG, HCVAB, HEPAIGM, HEPBIGM in the last 72 hours. C-Diff No results for input(s): CDIFFTOX in the last 72 hours.  Studies/Results: Dg Chest 1 View  Result Date: 01/28/2017 CLINICAL DATA:  Cough for 2 days. Shortness of breath. Former smoker. EXAM: CHEST 1 VIEW COMPARISON:  Chest radiograph January 22, 2017 FINDINGS: Stable cardiomegaly. Calcified aortic knob. Diffuse interstitial prominence. Blunting of LEFT costophrenic angle. No focal consolidation. No pneumothorax. Loop monitor projects in LEFT chest. Interval removal of RIGHT IJ venous catheter. Unchanged position of LEFT tunneled subclavian dialysis catheter with distal tip projecting mid superior vena cava. Soft tissue planes and included osseous structures are nonsuspicious. IMPRESSION: Stable cardiomegaly. Diffuse interstitial prominence seen with pulmonary edema and  atypical infection without focal consolidation. Trace LEFT pleural effusion. Interval removal of RIGHT IJ catheter, stable appearance of LEFT dialysis catheter. Aortic Atherosclerosis (ICD10-I70.0). Electronically Signed   By: Elon Alas M.D.   On: 01/28/2017 00:46   Ct Head Wo Contrast  Result Date:  01/28/2017 CLINICAL DATA:  Recurrent vision changes and RIGHT hand stiffness. Discharged from hospital today for CHF. History of RIGHT carotid artery occlusion, hypertension, diabetes. EXAM: CT HEAD WITHOUT CONTRAST TECHNIQUE: Contiguous axial images were obtained from the base of the skull through the vertex without intravenous contrast. COMPARISON:  MRI of the head January 21, 2017 and CT HEAD January 19, 2017 FINDINGS: BRAIN: No intraparenchymal hemorrhage, mass effect nor midline shift. RIGHT mesial temporal occipital lobe encephalomalacia. RIGHT frontoparietal encephalomalacia. Multifocal small areas of LEFT parietal lobe encephalomalacia Confluent supratentorial pontine white matter FLAIR T2 hyperintensities. Asymmetrically smaller RIGHT cerebral peduncle consistent with wallerian degeneration. Hypodensities bilateral basal ganglia and thalamus associated with chronic small vessel ischemic disease. Old small LEFT cerebellar infarct. No abnormal extra-axial fluid collections. Ex vacuo dilatation RIGHT lateral ventricle, mild parenchymal brain volume loss. No hydrocephalus. VASCULAR: Mild to moderate calcific atherosclerosis of the carotid siphons. SKULL: No skull fracture. No significant scalp soft tissue swelling. SINUSES/ORBITS: The mastoid air-cells and included paranasal sinuses are well-aerated.The included ocular globes and orbital contents are non-suspicious. Status post bilateral ocular lens implants. OTHER: None. IMPRESSION: 1. No acute intracranial process. 2. Old bilateral MCA and RIGHT PCA territory infarcts. Old small LEFT cerebellar infarct. 3. Severe chronic small vessel ischemic disease. 4. Mild parenchymal brain volume loss for age. Electronically Signed   By: Elon Alas M.D.   On: 01/28/2017 00:52    Impression: 66 year old female recently discharged from 3-week hospitalization for acute stroke as well as GI bleeding requiring 6 units of PRBC transfusion.  She has not had any  further bleeding since her endoscopic evaluation.  Endoscopy found moderate to severe NSAID-induced gastritis is likely major contributor to anemia.  Unable to complete colonoscopy due to unable to prep.  She was readmitted with possible TIA.  There is now a question as to anticoagulation options.  Her hemoglobin is improved from her discharge level and is currently at 8.7.  Based on recommendations made after endoscopy, would avoid all NSAIDs including aspirin.  Plavix is an option.  However, she would need to be monitored very closely for any recurrent bleeding.  I discussed the need to see seen urgently for any hematochezia or melena and she and her family both verbalized understanding.  Plan: 1. Avoid aspirin and NSAIDs 2. Plavix is a potential option based on risk versus benefit per neurology. 3. Monitor hemoglobin closely for any recurrent bleeding. 4. Monitor for any GI bleeding 5. Supportive measures   Thank you for allowing [place] to participate in the care of Stanley, DNP, AGNP-C Adult & Gerontological Nurse Practitioner [facility]    LOS: 0 days     01/28/2017, 8:42 AM

## 2017-01-28 NOTE — Progress Notes (Signed)
CRITICAL VALUE ALERT  Critical Value:  Troponin 0.04  Date & Time Notied:  11/30 12:45  Provider Notified: Dr. Roderic Palau  Orders Received/Actions taken: NA

## 2017-01-28 NOTE — Consult Note (Signed)
Kelli A. [name], MD     [email or web address]          Kelli [name] is an 66 y.o. female.   ASSESSMENT/PLAN: 1. Transit acute neurological problems involving the right upper extremity: The differential diagnosis is most likely between a partial seizure versus a transient ischemic attack. The patient anticoagulation Plavix has been restarted. An EEG will be obtained.   2. New onset atrial fibrillation:  Consider anticoagulation about 2 weeks from now if hemoglobin stabilizes. The anticoagulation if started should replaced the Plavix.  3. Chronically occluded right extracranial carotid:  4. Remote infarcts including large vessel right PCA infarct, right caudate nucleus infarct and left subcortical infarct.     Patient is a 66 year old right-handed white female who was just released from the hospital on yesterday. She was hospitalized for over a week for various reasons including progressive reduction in hemoglobin, renal failure and altered mental status. The patient presented to the hospital again today with the acute onset of right upper extremity symptoms. She reports unusual sensation negative fingers were stiff although she denies any shaking. She reports having difficulties controlling the right hand due to the stiffness. She does not report having symptoms involving the right leg. 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 The patient reports that she did have a headache yesterday which is unusual for the patient. She has had severe headaches in the past when she was younger but in not a whole lot recently. She denies any alteration of consciousness with this event, loss of consciousness, dysarthria, dizziness or dysphagia. The event lasted only 2 minutes and has completely resolved without any recurrence. Recent tremor with action R hand. The review systems is otherwise negative.   GENERAL: Please NAD  HEENT: Supple  ABDOMEN:  soft  EXTREMITIES: No edema   BACK: Normal  SKIN: Normal by inspection.    MENTAL STATUS: Alert and oriented including but states age as 30. Speech, language and cognition are generally intact. Judgment and insight normal. Mild L neglect.    CRANIAL NERVES: Pupils are equal, round and reactive to light and accomodation; extra ocular movements are full, there is no significant nystagmus; visual fields dense L field cut; upper and lower facial muscles are normal in strength and symmetric, there is no flattening of the nasolabial folds; tongue is midline; uvula is midline; shoulder elevation is normal.  MOTOR: RUE no drift deltoid 4/5; LUE 0/5. LUE dorsilfexion 2/5 and hip flexion 4/5 with mild drift; RUE dorsilfexion 5/5 and hip flexion /5 with no drift;   COORDINATION: Left finger to nose is normal, right finger to nose is normal, No rest tremor; no intention tremor; no postural tremor; no bradykinesia.  REFLEXES: Deep tendon reflexes are +2 R and +3 L. Babinski reflexes are flexor L and extensor R.   SENSATION: Normal to light touch, temperature, and pinprick.     NIH stroke scale 1 for age,  2 for field deficit, left upper extremity 2, left lower extremity 1, neglect 1  total equal 7.   Blood pressure (!) 181/83, pulse 89, temperature 98 F (36.7 C), resp. rate (!) 158, height 5' 5"  (1.651 m), weight 181 lb 3.5 oz (82.2 kg), SpO2 98 %.  Past Medical History:  Diagnosis Date  . Carotid artery occlusion    Occluded RICA, status post left CEA  August 2014 - Dr. Donnetta Hutching  . Cerebral infarction [facility]) Aug 2014   Bihemispheric watershed infarcts  . Cerebral infarction involving  left cerebellar artery [facility]) Feb 2015  . CKD (chronic kidney disease) stage 3, GFR 30-59 ml/min (HCC)   . Closed dislocation of left humerus 07/26/2013  . DM (diabetes mellitus), type 2 ([place])   . Essential hypertension, benign   . Fibromyalgia   . Mixed hyperlipidemia   . Multiple gastric ulcers   . Urinary  incontinence     Past Surgical History:  Procedure Laterality Date  . BIOPSY  01/22/2017   Procedure: BIOPSY;  Surgeon: Danie Binder, MD;  [facility];  Service: Endoscopy;;  gastric  . COMBINED HYSTERECTOMY VAGINAL W/ MMK / A&P REPAIR  1981  . ENDARTERECTOMY Left 10/06/2012   Procedure: Carotid Endarterectomy with Finesse patch angioplasty;  Surgeon: [name], MD;  Location: [place];  Service: Vascular;  Laterality: Left;  . ESOPHAGOGASTRODUODENOSCOPY N/A 04/26/2014   Procedure: ESOPHAGOGASTRODUODENOSCOPY (EGD);  Surgeon: Lear Ng, MD;  Location: [facility] ENDOSCOPY;  Service: Endoscopy;  Laterality: N/A;  . ESOPHAGOGASTRODUODENOSCOPY N/A 01/22/2017   Procedure: ESOPHAGOGASTRODUODENOSCOPY (EGD);  Surgeon: Danie Binder, MD;  Location: [facility];  Service: Endoscopy;  Laterality: N/A;  . FLEXIBLE SIGMOIDOSCOPY N/A 01/22/2017   Procedure: FLEXIBLE SIGMOIDOSCOPY;  Surgeon: Danie Binder, MD;  Location: [facility];  Service: Endoscopy;  Laterality: N/A;  . IR FLUORO GUIDE CV LINE LEFT  01/14/2017  . IR US GUIDE VASC ACCESS LEFT  01/14/2017  . LOOP RECORDER IMPLANT  04/16/13   MDT LinQ implanted for cryptogenic stroke  . [name] WITHOUT CARDIOVERSION N/A 04/16/2013   Procedure: TRANSESOPHAGEAL ECHOCARDIOGRAM ([name]);  Surgeon: Josue Hector, MD;  Location: [facility] ENDOSCOPY;  Service: Cardiovascular;  Laterality: N/A;  . URETHRAL DILATION  1980's  . VAGINAL HYSTERECTOMY  1981   "partial" (10/04/2012)    Family History  Problem Relation Age of Onset  . Diabetes Mother   . Diabetes Father   . Hypertension Sister   . Diabetes Brother   . Seizures Son   . Kidney disease Maternal Grandmother   . Hypertension Maternal Grandmother   . Heart disease Maternal Grandfather   . Diabetes Paternal Grandmother   . Heart disease Paternal Grandfather     Social History:  reports that she quit smoking about 4 years ago. Her smoking use included cigarettes. She has a 44.00 pack-year  smoking history. she has never used smokeless tobacco. She reports that she does not drink alcohol or use drugs.  Allergies:  Allergies  Allergen Reactions  . Hydrochlorothiazide Nausea And Vomiting    Medications: Prior to Admission medications   Medication Sig Start Date End Date Taking? Authorizing Provider  atorvastatin (LIPITOR) 40 MG tablet Take 1 tablet (40 mg total) by mouth daily at 6 PM. 10/20/16   Cherene Altes, MD  budesonide (PULMICORT) 0.5 MG/2ML nebulizer solution Take 2 mLs (0.5 mg total) by nebulization 2 (two) times daily. 01/27/17   Kathie Dike, MD  calcitRIOL (ROCALTROL) 0.25 MCG capsule Take 1 capsule (0.25 mcg total) by mouth daily. 10/20/16   Cherene Altes, MD  epoetin alfa (EPOGEN,PROCRIT) 30076 UNIT/ML injection Inject 1 mL (10,000 Units total) into the vein Every Tuesday,Thursday,and Saturday with dialysis. 01/29/17   Kathie Dike, MD  feeding supplement (BOOST / RESOURCE BREEZE) LIQD Take 1 Container by mouth 3 (three) times daily between meals. 01/27/17   Kathie Dike, MD  guaiFENesin-dextromethorphan (ROBITUSSIN DM) 100-10 MG/5ML syrup Take 5 mLs by mouth every 4 (four) hours as needed for cough. 01/27/17   Kathie Dike, MD  hydrALAZINE (APRESOLINE)  50 MG tablet Take 1 tablet (50 mg total) by mouth 3 (three) times daily. 10/20/16   Cherene Altes, MD  insulin aspart (NOVOLOG) 100 UNIT/ML injection Inject 0-15 Units into the skin 3 (three) times daily with meals. 01/19/17   Reyne Dumas, MD  ipratropium-albuterol (DUONEB) 0.5-2.5 (3) MG/3ML SOLN Take 3 mLs by nebulization every 6 (six) hours as needed. 01/19/17   Reyne Dumas, MD  loratadine (CLARITIN) 10 MG tablet Take 1 tablet (10 mg total) by mouth daily. 01/20/17   Reyne Dumas, MD  metoprolol tartrate (LOPRESSOR) 50 MG tablet Take 1 tablet (50 mg total) by mouth 2 (two) times daily. 01/27/17   Kathie Dike, MD  pantoprazole (PROTONIX) 40 MG tablet Take 1 tablet (40 mg total) by mouth  2 (two) times daily before a meal. 01/27/17   Kathie Dike, MD  traMADol (ULTRAM) 50 MG tablet Take 1 tablet (50 mg total) by mouth every 12 (twelve) hours as needed for moderate pain. 01/19/17   Reyne Dumas, MD  venlafaxine XR (EFFEXOR-XR) 150 MG 24 hr capsule Take 150 mg by mouth daily with breakfast.    [provider]    Scheduled Meds: .  stroke: mapping our early stages of recovery book   Does not apply Once  . atorvastatin  40 mg Oral q1800  . budesonide  0.5 mg Nebulization BID  . calcitRIOL  0.25 mcg Oral Daily  . [START ON 01/31/2017] epoetin (EPOGEN/PROCRIT) injection  8,000 Units Subcutaneous Q M,W,F-HD  . feeding supplement  1 Container Oral TID BM  . heparin      . heparin  5,000 Units Subcutaneous Q8H  . insulin aspart  0-15 Units Subcutaneous TID WC  . insulin aspart  0-5 Units Subcutaneous QHS  . loratadine  10 mg Oral Daily  . pantoprazole  40 mg Oral BID AC  . venlafaxine XR  150 mg Oral Q breakfast   Continuous Infusions: . sodium chloride    . sodium chloride     PRN Meds:.sodium chloride, sodium chloride, acetaminophen **OR** acetaminophen (TYLENOL) oral liquid 160 mg/5 mL **OR** acetaminophen, alteplase, guaiFENesin-dextromethorphan, heparin, ipratropium-albuterol, labetalol, lidocaine (PF), lidocaine-prilocaine, pentafluoroprop-tetrafluoroeth, senna-docusate, traMADol     Results for orders placed or performed during the hospital encounter of 01/27/17 (from the past 48 hour(s))  Comprehensive metabolic panel     Status: Abnormal   Collection Time: 01/27/17 11:40 PM  Result Value Ref Range   Sodium 135 135 - 145 mmol/L   Potassium 4.1 3.5 - 5.1 mmol/L   Chloride 98 (L) 101 - 111 mmol/L   CO2 27 22 - 32 mmol/L   Glucose, Bld 204 (H) 65 - 99 mg/dL   BUN 35 (H) 6 - 20 mg/dL   Creatinine, Ser 3.15 (H) 0.44 - 1.00 mg/dL   Calcium 7.0 (L) 8.9 - 10.3 mg/dL   Total Protein 5.8 (L) 6.5 - 8.1 g/dL   Albumin 2.9 (L) 3.5 - 5.0 g/dL   AST 16 15 - 41  U/L   ALT 10 (L) 14 - 54 U/L   Alkaline Phosphatase 70 38 - 126 U/L   Total Bilirubin 0.5 0.3 - 1.2 mg/dL   GFR calc non Af Amer 14 (L) >60 mL/min   GFR calc Af Amer 17 (L) >60 mL/min    Comment: (NOTE) The eGFR has been calculated using the CKD EPI equation. This calculation has not been validated in all clinical situations. eGFR's persistently <60 mL/min signify possible Chronic Kidney Disease.    Anion gap  10 5 - 15  Brain natriuretic peptide     Status: Abnormal   Collection Time: 01/27/17 11:40 PM  Result Value Ref Range   B Natriuretic Peptide 1,264.0 (H) 0.0 - 100.0 pg/mL  Troponin I     Status: Abnormal   Collection Time: 01/27/17 11:40 PM  Result Value Ref Range   Troponin I 0.04 (HH) <0.03 ng/mL    Comment: CRITICAL RESULT CALLED TO, READ BACK BY AND VERIFIED WITH: MYRICK,B AT 0032 ON 11.30.2018 BY ISLEY,B   CBC with Differential     Status: Abnormal   Collection Time: 01/27/17 11:40 PM  Result Value Ref Range   WBC 6.8 4.0 - 10.5 K/uL   RBC 2.96 (L) 3.87 - 5.11 MIL/uL   Hemoglobin 8.7 (L) 12.0 - 15.0 g/dL   HCT 27.6 (L) 36.0 - 46.0 %   MCV 93.2 78.0 - 100.0 fL   MCH 29.4 26.0 - 34.0 pg   MCHC 31.5 30.0 - 36.0 g/dL   RDW 17.4 (H) 11.5 - 15.5 %   Platelets 179 150 - 400 K/uL   Neutrophils Relative % 70 %   Neutro Abs 4.8 1.7 - 7.7 K/uL   Lymphocytes Relative 17 %   Lymphs Abs 1.1 0.7 - 4.0 K/uL   Monocytes Relative 10 %   Monocytes Absolute 0.7 0.1 - 1.0 K/uL   Eosinophils Relative 2 %   Eosinophils Absolute 0.1 0.0 - 0.7 K/uL   Basophils Relative 1 %   Basophils Absolute 0.0 0.0 - 0.1 K/uL  Glucose, capillary     Status: Abnormal   Collection Time: 01/28/17  8:01 AM  Result Value Ref Range   Glucose-Capillary 136 (H) 65 - 99 mg/dL   Comment 1 Notify RN    Comment 2 Document in Chart   Troponin I (q 6hr x 3)     Status: Abnormal   Collection Time: 01/28/17 11:15 AM  Result Value Ref Range   Troponin I 0.04 (HH) <0.03 ng/mL    Comment: CRITICAL RESULT  CALLED TO, READ BACK BY AND VERIFIED WITH: BULLINS,M @ 1239 ON 11.30.18 BY BOWMAN,L   Glucose, capillary     Status: Abnormal   Collection Time: 01/28/17 11:20 AM  Result Value Ref Range   Glucose-Capillary 199 (H) 65 - 99 mg/dL   Comment 1 Notify RN    Comment 2 Document in Chart   Glucose, capillary     Status: Abnormal   Collection Time: 01/28/17  4:38 PM  Result Value Ref Range   Glucose-Capillary 120 (H) 65 - 99 mg/dL   Comment 1 Notify RN    Comment 2 Document in Chart     Studies/Results:  CAROTID DOPPLERS IMPRESSION: Chronic occlusion of the right internal carotid artery.  Surgical changes compatible with a left carotid endarterectomy. Mild plaque and tortuosity in the left internal carotid artery. Estimated degree of stenosis in the left internal carotid artery is less than 50%.  Retrograde flow in the left vertebral artery. 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 This appears to be a chronic finding.  Antegrade flow in the right vertebral artery.    TTE Study Conclusions  - Left ventricle: The cavity size was normal. Systolic function was   normal. The estimated ejection fraction was in the range of 55%   to 60%. Wall motion was normal; there were no regional wall   motion abnormalities. The study is not technically sufficient to   allow evaluation of LV  diastolic function due to tachycardia and   E/a fusion. Moderate to severe left ventricular hypertrophy. - Aortic valve: Trileaflet; mildly thickened, mildly calcified   leaflets. Sclerosis without stenosis. There was no stenosis. - Mitral valve: Calcified annulus. - Left atrium: The atrium was mildly dilated. - Atrial septum: No defect or patent foramen ovale was identified.      BRAIN MRI FINDINGS: Brain: Negative for acute infarct.  Moderate atrophy. Extensive chronic ischemic changes. Extensive chronic microvascular ischemia throughout  the white matter bilaterally. Chronic infarct in the right midbrain. Chronic ischemia in the pons bilaterally. Chronic infarct left thalamus and basal ganglia bilaterally. Small chronic infarct in the left cerebellum. Chronic right occipital infarct. Chronic hemorrhagic infarct right posterior frontal lobe. Negative for mass or edema or midline shift.  Vascular: Loss of flow void right internal carotid artery unchanged compatible with occlusion. Otherwise normal flow voids  Skull and upper cervical spine: Negative  Sinuses/Orbits: Mild mucosal edema paranasal sinuses. Bilateral cataract removal  Other: None  IMPRESSION: Negative for acute infarct  Atrophy and advanced chronic ischemic changes. Chronic occlusion right internal carotid artery.        THE BRAIN MRI SCAN IS REVIEWED IN PERSON.   No acute infarcts are seen on DWI.  There are chronic infarcts involving the right medial temporal lobe extending to the occipital lobe consistent with a chronic right PCA infarct.  This is associated with the encephalomalacia and reduced signal on T1 along with increased signal on FLAIR.  Similar findings are seen involving the caudate nuclei on the right and left frontal juxtacortical areas.  These two lesions are smaller however.  There is severe confluent increased signal seen on FLAIR consistent with severe chronic microvascular changes.  There is a small area of chronic micro hemorrhage involving the deep left frontal lobe.       Brexton Sofia A. Merlene [name], M.D.  Diplomate, Tax adviser of Psychiatry and Neurology ( Neurology). 01/28/2017, 4:50 PM

## 2017-01-28 NOTE — Progress Notes (Signed)
OT Cancellation Note  Patient Details Name: Kelli [name] MRN: [id number] DOB: [birth year]   Cancelled Treatment:    Reason Eval/Treat Not Completed: Patient declined, no reason specified;OT screened, no needs identified, will sign off. OT familiar with pt from previous admission during which pt has hx of refusing rehab services. Pt refused evaluation this am, did report her symptoms resolved after approximately 1 minute. Pt is max-total care at baseline, husband was assisting at home and hospital staff while admitted. Defer OT evaluation and treatment to SNF staff when pt discharges from current admission.    [name], OTR/L  [phone] 01/28/2017, 9:05 AM

## 2017-01-28 NOTE — Consult Note (Signed)
Kelli [name] MRN: [id number] DOB/AGE: 1951-01-13 66 y.o. Primary Care Physician:[name], [name], MD Admit date: 01/27/2017 Chief Complaint:  Chief Complaint  Patient presents with  . Eye Problem   HPI: Pt is a 66 year old  female with past medical history significant for HTN , DM,  CVA with left hemiparesis, who came to ER with c/o visual disturbance and right hand weakness  HPI dates back to yesterday when after being  discharged to the SNFpt noted "black spots" in her vision and increased weakness involving her right hand.   Pt had  head CT which is negative for acute intracranial abnormality, but was notable for old strokes.  ER had Neurology consulted and they  recommended medical admission for repeat MRI and consultation with gastroenterology for guidance on anticoagulation or antiplatelet therapy given the patient's history of atrial fibrillation with suspected recurrent CVA, and occult GI bleed recently requiring transfusions. Pt offer no c/o  Fevers/cough/chills.  NO c/o  any epigastric pain.   Pt seen on 3rd floor,Pt main concern is " I am still congested, I think I need dialysis to help remove fluid"     Past Medical History:  Diagnosis Date  . Carotid artery occlusion    Occluded RICA, status post left CEA  August 2014 - Dr. Donnetta Hutching  . Cerebral infarction [facility]) Aug 2014   Bihemispheric watershed infarcts  . Cerebral infarction involving left cerebellar artery [facility]) Feb 2015  . CKD (chronic kidney disease) stage 3, GFR 30-59 ml/min (HCC)   . Closed dislocation of left humerus 07/26/2013  . DM (diabetes mellitus), type 2 ([place])   . Essential hypertension, benign   . Fibromyalgia   . Mixed hyperlipidemia   . Multiple gastric ulcers   . Urinary incontinence         Family History  Problem Relation Age of Onset  . Diabetes Mother   . Diabetes Father   . Hypertension Sister   . Diabetes Brother   . Seizures Son   . Kidney disease Maternal Grandmother   . Hypertension  Maternal Grandmother   . Heart disease Maternal Grandfather   . Diabetes Paternal Grandmother   . Heart disease Paternal Grandfather   Pt father was on HD.  Social History:  reports that she quit smoking about 4 years ago. Her smoking use included cigarettes. She has a 44.00 pack-year smoking history. she has never used smokeless tobacco. She reports that she does not drink alcohol or use drugs.   Allergies:  Allergies  Allergen Reactions  . Hydrochlorothiazide Nausea And Vomiting    Medications Prior to Admission  Medication Sig Dispense Refill  . atorvastatin (LIPITOR) 40 MG tablet Take 1 tablet (40 mg total) by mouth daily at 6 PM. 30 tablet 0  . budesonide (PULMICORT) 0.5 MG/2ML nebulizer solution Take 2 mLs (0.5 mg total) by nebulization 2 (two) times daily.  12  . calcitRIOL (ROCALTROL) 0.25 MCG capsule Take 1 capsule (0.25 mcg total) by mouth daily. 30 capsule 0  . [START ON 01/29/2017] epoetin alfa (EPOGEN,PROCRIT) 75643 UNIT/ML injection Inject 1 mL (10,000 Units total) into the vein Every Tuesday,Thursday,and Saturday with dialysis. 1 mL   . feeding supplement (BOOST / RESOURCE BREEZE) LIQD Take 1 Container by mouth 3 (three) times daily between meals.  0  . guaiFENesin-dextromethorphan (ROBITUSSIN DM) 100-10 MG/5ML syrup Take 5 mLs by mouth every 4 (four) hours as needed for cough. 118 mL 0  . hydrALAZINE (APRESOLINE) 50 MG tablet Take 1 tablet (50  mg total) by mouth 3 (three) times daily. 90 tablet 0  . insulin aspart (NOVOLOG) 100 UNIT/ML injection Inject 0-15 Units into the skin 3 (three) times daily with meals. 10 mL 11  . ipratropium-albuterol (DUONEB) 0.5-2.5 (3) MG/3ML SOLN Take 3 mLs by nebulization every 6 (six) hours as needed. 360 mL 1  . loratadine (CLARITIN) 10 MG tablet Take 1 tablet (10 mg total) by mouth daily. 30 tablet 1  . metoprolol tartrate (LOPRESSOR) 50 MG tablet Take 1 tablet (50 mg total) by mouth 2 (two) times daily.    . pantoprazole (PROTONIX) 40 MG  tablet Take 1 tablet (40 mg total) by mouth 2 (two) times daily before a meal.    . traMADol (ULTRAM) 50 MG tablet Take 1 tablet (50 mg total) by mouth every 12 (twelve) hours as needed for moderate pain. 30 tablet 0  . venlafaxine XR (EFFEXOR-XR) 150 MG 24 hr capsule Take 150 mg by mouth daily with breakfast.         PIR:JJOAC from the symptoms mentioned above,there are no other symptoms referable to all systems reviewed.  .  stroke: mapping our early stages of recovery book   Does not apply Once  . atorvastatin  40 mg Oral q1800  . budesonide  0.5 mg Nebulization BID  . calcitRIOL  0.25 mcg Oral Daily  . feeding supplement  1 Container Oral TID BM  . heparin  5,000 Units Subcutaneous Q8H  . insulin aspart  0-15 Units Subcutaneous TID WC  . insulin aspart  0-5 Units Subcutaneous QHS  . loratadine  10 mg Oral Daily  . pantoprazole  40 mg Oral BID AC  . venlafaxine XR  150 mg Oral Q breakfast    Physical Exam: Vital signs in last 24 hours: Temp:  [98 F (36.7 C)-98.9 F (37.2 C)] 98 F (36.7 C) (11/30 1300) Pulse Rate:  [89-102] 89 (11/30 1300) Resp:  [14-158] 158 (11/30 1300) BP: (154-198)/(64-83) 181/83 (11/30 1300) SpO2:  [93 %-99 %] 98 % (11/30 1300) Weight:  [181 lb 3.5 oz (82.2 kg)-192 lb (87.1 kg)] 181 lb 3.5 oz (82.2 kg) (11/30 0300) Weight change:     Intake/Output from previous day: No intake/output data recorded. No intake/output data recorded.   Physical Exam: General- pt is awake,alert, oriented to time place and person Resp- No acute REsp distress,Rhonchi present CVS- S1S2 regular in rate and rhythm GIT- BS+, soft, NT, ND EXT- 1+ LE Edema, no Cyanosis, chronic venous stasis changes CNS- CN 2-12 grossly intact. Psych- normal mood and affect Access -tunnelled cath   Lab Results: CBC Recent Labs    01/27/17 0452 01/27/17 2340  WBC 7.5 6.8  HGB 8.0* 8.7*  HCT 25.8* 27.6*  PLT 159 179    BMET Recent Labs    01/27/17 0452 01/27/17 2340  NA  137 135  K 4.1 4.1  CL 101 98*  CO2 25 27  GLUCOSE 129* 204*  BUN 63* 35*  CREATININE 4.55* 3.15*  CALCIUM 6.7* 7.0*    Creat trend 2018   4.5=> now ESRD 2016   1.4--1.5 2015   1.3--1.7 2014   1.1--2.0 ( AKi)    MICRO Recent Results (from the past 240 hour(s))  MRSA PCR Screening     Status: None   Collection Time: 01/22/17  7:21 PM  Result Value Ref Range Status   MRSA by PCR NEGATIVE NEGATIVE Final    Comment:        The GeneXpert MRSA Assay (FDA  approved for NASAL specimens only), is one component of a comprehensive MRSA colonization surveillance program. It is not intended to diagnose MRSA infection nor to guide or monitor treatment for MRSA infections.       Lab Results  Component Value Date   PTH 162 (H) 10/14/2016   PTH Comment 10/14/2016   CALCIUM 7.0 (L) 01/27/2017   CAION 1.14 04/08/2013   PHOS 4.2 01/26/2017   ANA Positive Anti dsDNA negative Complements normal ANCA negative M spike negative Free kappa/lamda chain ratio- 2.08    Impression: 1)Renal   ESRD.  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  3)Anemia HGb not at goal (9--11). Pt has Received prbc. Last Hospital course marked with GI bleeding On Epo during HD  4)CKD Mineral-Bone Disorder PTH elevated. Secondary Hyperparathyroidism- present.    On calcitriol Phosphorus now at  goal. Calcium now at goal  5)DM PMD following  6)Electrolytes  normokalemic   normonatremic   7)Acid base Co2 now at goal    Plan:  Will dialyze today Will use 3k bath Will  keep on epo Will not use any heparin.   Aradhana Gin S 01/28/2017, 1:40 PM

## 2017-01-28 NOTE — ED Notes (Signed)
Date and time results received: 01/28/17 12:30 AM Test: troponin Critical Value: 0.04  Name of Provider Notified: dr.[email or web address]  Orders Received? Or Actions Taken?: md notified

## 2017-01-28 NOTE — ED Provider Notes (Addendum)
[facility] EMERGENCY DEPARTMENT Provider Note   CSN: [id number] Arrival date & time: 01/27/17  2223  Time seen 23:35 PM   History   Chief Complaint Chief Complaint  Patient presents with  . Eye Problem    HPI Kelli [name] is a 66 y.o. female.  HPI she states she was discharged from the hospital this afternoon and went to a facility for rehabilitation.  She states that the time she was discharged she did not feel any better.  She states about an hour prior to coming to the emergency room her right middle and ring fingers got stiff and felt "like stone".  She states she had the same on the left side when she had her prior stroke.  It started in her fingers and then spread to her left side and she has left hemiparesis.  She was admitted on November 8 and discharged on November 29.  She was admitted for altered mental status and had a MRI that showed that she did have an acute lacunar infarct.  Discussion was started about treating her stroke with blood thinners however she has had occult anemia from unknown GI source so they elected to not put her on blood thinners for her stroke.  She states she had no symptoms with the lacunar infarct that she is aware of.  She states she had a headache yesterday but not today.  She states tonight she had some spots in her eyes that were black but they are gone now.  They lasted about a minute.  She states her fingers are now back to normal and it lasted about a minute.  She also reports being short of breath for 2 days without swelling of her extremities.  Her son feels like she has swelling.  She states she had a cramp in her left foot for a few minutes while in the ED.  Patient was started on dialysis 1 week ago.  She is going to be on a Monday, Wednesday, and Friday schedule however due to machine malfunction she did have dialysis today.  Son wants her to be readmitted and get dialysis in the hospital tomorrow.  PCP [name], [name], MD Nephrology Dr  Lowanda Foster  Past Medical History:  Diagnosis Date  . Carotid artery occlusion    Occluded RICA, status post left CEA  August 2014 - Dr. Donnetta Hutching  . Cerebral infarction [facility]) Aug 2014   Bihemispheric watershed infarcts  . Cerebral infarction involving left cerebellar artery [facility]) Feb 2015  . CKD (chronic kidney disease) stage 3, GFR 30-59 ml/min (HCC)   . Closed dislocation of left humerus 07/26/2013  . DM (diabetes mellitus), type 2 ([place])   . Essential hypertension, benign   . Fibromyalgia   . Mixed hyperlipidemia   . Multiple gastric ulcers   . Urinary incontinence     Patient Active Problem List   Diagnosis Date Noted  . Occult GI bleeding 01/28/2017  . TIA (transient ischemic attack) 01/28/2017  . Pressure injury of skin 01/23/2017  . ESRD (end stage renal disease) ([place])   . Gastritis and duodenitis   . Acute metabolic encephalopathy 49/67/5916  . Palliative care encounter   . Goals of care, counseling/discussion   . DNR (do not resuscitate) discussion   . Acute renal failure superimposed on stage 4 chronic kidney disease ([place]) 01/09/2017  . Precordial pain   . CKD (chronic kidney disease), stage IV ([place]) 01/07/2017  . Acute respiratory failure with hypoxia ([place]) 01/07/2017  .  Anemia 01/06/2017  . Diabetes mellitus type 2 in obese ([place]) 01/06/2017  . B12 deficiency 11/18/2016  . Anemia in chronic kidney disease 11/18/2016  . PUD (peptic ulcer disease) 10/13/2016  . Pacemaker 10/13/2016  . Acute on chronic diastolic CHF (congestive heart failure) ([place]) 10/13/2016  . Elevated troponin   . Hypovolemic shock ([place]) 04/26/2014  . Upper GI bleed   . Leukocytosis, unspecified 07/30/2013  . Thrombocytosis ([place]) 07/30/2013  . Subluxation of left shoulder joint 07/27/2013  . Closed dislocation of left humerus 07/26/2013  . Pyelonephritis 07/25/2013  . Acute on chronic renal failure ([place]) 07/25/2013  . Normocytic anemia 07/25/2013  . Hyponatremia 07/25/2013  . Spastic hemiplegia  affecting nondominant side ([place]) 07/06/2013  . 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 UTI (urinary tract infection) 06/15/2013  . Edema 05/28/2013  . Knee pain 05/28/2013  . Carotid stenosis, bilateral 05/22/2013  . Fall at nursing home 05/20/2013  . Hip pain 05/20/2013  . Pain in joint, lower leg 05/19/2013  . Chest congestion 05/12/2013  . E. coli UTI 05/07/2013  . Left shoulder pain due to subluxation and/or adhesive capsulitis.  05/07/2013  . Acute CVA (cerebrovascular accident): R PCA infarct per MRI 04/13/13 04/13/2013  . CVA (cerebral infarction) 04/08/2013  . Acute ischemic stroke ([place]) 04/08/2013  . Diplopia 04/08/2013  . Left-sided weakness 04/08/2013  . Vertigo 04/08/2013  . Paresthesias in left hand 04/08/2013  . Cerebellar stroke ([place]) 04/08/2013  . Dizziness and giddiness 04/08/2013  . Stroke ([place]) 04/08/2013  . Depression 04/06/2013  . Peripheral arterial disease ([place]) 03/16/2013  . History of stroke 01/30/2013  . Anxiety state, unspecified 12/08/2012  . Lumbago 12/08/2012  . Urinary frequency 11/14/2012  . Occlusion and stenosis of carotid artery with cerebral infarction 10/05/2012  . Mixed hyperlipidemia 10/05/2012  . Renal insufficiency 10/05/2012  . HTN (hypertension) 10/04/2012  . Tobacco abuse 10/04/2012  . Obesity 10/04/2012    Past Surgical History:  Procedure Laterality Date  . BIOPSY  01/22/2017   Procedure: BIOPSY;  Surgeon: Danie Binder, MD;  [facility];  Service: Endoscopy;;  gastric  . COMBINED HYSTERECTOMY VAGINAL W/ MMK / A&P REPAIR  1981  . ENDARTERECTOMY Left 10/06/2012   Procedure: Carotid Endarterectomy with Finesse patch angioplasty;  Surgeon: [name], MD;  Location: [place];  Service: Vascular;  Laterality: Left;  . ESOPHAGOGASTRODUODENOSCOPY N/A 04/26/2014   Procedure: ESOPHAGOGASTRODUODENOSCOPY (EGD);  Surgeon: Lear Ng, MD;  [facility] ENDOSCOPY;  Service:  Endoscopy;  Laterality: N/A;  . ESOPHAGOGASTRODUODENOSCOPY N/A 01/22/2017   Procedure: ESOPHAGOGASTRODUODENOSCOPY (EGD);  Surgeon: Danie Binder, MD;  [facility];  Service: Endoscopy;  Laterality: N/A;  . FLEXIBLE SIGMOIDOSCOPY N/A 01/22/2017   Procedure: FLEXIBLE SIGMOIDOSCOPY;  Surgeon: Danie Binder, MD;  [facility];  Service: Endoscopy;  Laterality: N/A;  . IR FLUORO GUIDE CV LINE LEFT  01/14/2017  . IR US GUIDE VASC ACCESS LEFT  01/14/2017  . LOOP RECORDER IMPLANT  04/16/13   MDT LinQ implanted for cryptogenic stroke  . [name] WITHOUT CARDIOVERSION N/A 04/16/2013   Procedure: TRANSESOPHAGEAL ECHOCARDIOGRAM ([name]);  Surgeon: Josue Hector, MD;  Location[facility]COPY;  Service: Cardiovascular;  Laterality: N/A;  . URETHRAL DILATION  1980's  . VAGINAL HYSTERECTOMY  1981   "partial" (10/04/2012)    OB History    No data available       Home Medications    Prior to Admission medications   Medication Sig  Start Date End Date Taking? Authorizing Provider  atorvastatin (LIPITOR) 40 MG tablet Take 1 tablet (40 mg total) by mouth daily at 6 PM. 10/20/16   Cherene Altes, MD  budesonide (PULMICORT) 0.5 MG/2ML nebulizer solution Take 2 mLs (0.5 mg total) by nebulization 2 (two) times daily. 01/27/17   Kathie Dike, MD  calcitRIOL (ROCALTROL) 0.25 MCG capsule Take 1 capsule (0.25 mcg total) by mouth daily. 10/20/16   Cherene Altes, MD  epoetin alfa (EPOGEN,PROCRIT) 60454 UNIT/ML injection Inject 1 mL (10,000 Units total) into the vein Every Tuesday,Thursday,and Saturday with dialysis. 01/29/17   Kathie Dike, MD  feeding supplement (BOOST / RESOURCE BREEZE) LIQD Take 1 Container by mouth 3 (three) times daily between meals. 01/27/17   Kathie Dike, MD  guaiFENesin-dextromethorphan (ROBITUSSIN DM) 100-10 MG/5ML syrup Take 5 mLs by mouth every 4 (four) hours as needed for cough. 01/27/17   Kathie Dike, MD  hydrALAZINE (APRESOLINE) 50 MG tablet Take 1  tablet (50 mg total) by mouth 3 (three) times daily. 10/20/16   Cherene Altes, MD  insulin aspart (NOVOLOG) 100 UNIT/ML injection Inject 0-15 Units into the skin 3 (three) times daily with meals. 01/19/17   Reyne Dumas, MD  ipratropium-albuterol (DUONEB) 0.5-2.5 (3) MG/3ML SOLN Take 3 mLs by nebulization every 6 (six) hours as needed. 01/19/17   Reyne Dumas, MD  loratadine (CLARITIN) 10 MG tablet Take 1 tablet (10 mg total) by mouth daily. 01/20/17   Reyne Dumas, MD  metoprolol tartrate (LOPRESSOR) 50 MG tablet Take 1 tablet (50 mg total) by mouth 2 (two) times daily. 01/27/17   Kathie Dike, MD  pantoprazole (PROTONIX) 40 MG tablet Take 1 tablet (40 mg total) by mouth 2 (two) times daily before a meal. 01/27/17   Kathie Dike, MD  traMADol (ULTRAM) 50 MG tablet Take 1 tablet (50 mg total) by mouth every 12 (twelve) hours as needed for moderate pain. 01/19/17   Reyne Dumas, MD  venlafaxine XR (EFFEXOR-XR) 150 MG 24 hr capsule Take 150 mg by mouth daily with breakfast.    [provider]    Family History Family History  Problem Relation Age of Onset  . Diabetes Mother   . Diabetes Father   . Hypertension Sister   . Diabetes Brother   . Seizures Son   . Kidney disease Maternal Grandmother   . Hypertension Maternal Grandmother   . Heart disease Maternal Grandfather   . Diabetes Paternal Grandmother   . Heart disease Paternal Grandfather     Social History Social History   Tobacco Use  . Smoking status: Former Smoker    Packs/day: 1.00    Years: 44.00    Pack years: 44.00    Types: Cigarettes    Last attempt to quit: 10/03/2012    Years since quitting: 4.3  . Smokeless tobacco: Never Used  Substance Use Topics  . Alcohol use: No    Alcohol/week: 0.0 oz  . Drug use: No  was living at home   Allergies   Hydrochlorothiazide   Review of Systems Review of Systems  All other systems reviewed and are negative.    Physical Exam Updated Vital  Signs BP (!) 173/64   Pulse 93   Temp 98.8 F (37.1 C) (Oral)   Resp 19   Ht 5\' 5"  (1.651 m)   Wt 87.1 kg (192 lb)   SpO2 93%   BMI 31.95 kg/m   Physical Exam  Constitutional: She is oriented to person, place, and time. She  appears well-developed and well-nourished.  Non-toxic appearance. She does not appear ill. No distress.  HENT:  Head: Normocephalic and atraumatic.  Right Ear: External ear normal.  Left Ear: External ear normal.  Nose: Nose normal. No mucosal edema or rhinorrhea.  Mouth/Throat: Oropharynx is clear and moist and mucous membranes are normal. No dental abscesses or uvula swelling.  Eyes: Conjunctivae and EOM are normal. Pupils are equal, round, and reactive to light.    Visual Acuity  Right Eye Distance: 20/30 Left Eye Distance: 20/30 Bilateral Distance: 20/30  Right Eye Near:   Left Eye Near:    Bilateral Near:      Neck: Normal range of motion and full passive range of motion without pain. Neck supple.  Cardiovascular: Normal rate, regular rhythm and normal heart sounds. Exam reveals no gallop and no friction rub.  No murmur heard. Pulmonary/Chest: Effort normal and breath sounds normal. Tachypnea noted. No respiratory distress. She has no wheezes. She has no rhonchi. She has no rales. She exhibits no tenderness and no crepitus.  Rattling cough  Abdominal: Soft. Normal appearance and bowel sounds are normal. She exhibits no distension. There is no tenderness. There is no rebound and no guarding.  Musculoskeletal: Normal range of motion. She exhibits no edema or tenderness.  Moves all extremities well.   Neurological: She is alert and oriented to person, place, and time. She has normal strength. No cranial nerve deficit.  Left hemiparesis  Skin: Skin is warm, dry and intact. No rash noted. No erythema. No pallor.  Psychiatric: She has a normal mood and affect. Her speech is normal and behavior is normal. Her mood appears not anxious.  Nursing note and  vitals reviewed.    ED Treatments / Results  Labs (all labs ordered are listed, but only abnormal results are displayed) Results for orders placed or performed during the hospital encounter of 01/27/17  Comprehensive metabolic panel  Result Value Ref Range   Sodium 135 135 - 145 mmol/L   Potassium 4.1 3.5 - 5.1 mmol/L   Chloride 98 (L) 101 - 111 mmol/L   CO2 27 22 - 32 mmol/L   Glucose, Bld 204 (H) 65 - 99 mg/dL   BUN 35 (H) 6 - 20 mg/dL   Creatinine, Ser 3.15 (H) 0.44 - 1.00 mg/dL   Calcium 7.0 (L) 8.9 - 10.3 mg/dL   Total Protein 5.8 (L) 6.5 - 8.1 g/dL   Albumin 2.9 (L) 3.5 - 5.0 g/dL   AST 16 15 - 41 U/L   ALT 10 (L) 14 - 54 U/L   Alkaline Phosphatase 70 38 - 126 U/L   Total Bilirubin 0.5 0.3 - 1.2 mg/dL   GFR calc non Af Amer 14 (L) >60 mL/min   GFR calc Af Amer 17 (L) >60 mL/min   Anion gap 10 5 - 15  Brain natriuretic peptide  Result Value Ref Range   B Natriuretic Peptide 1,264.0 (H) 0.0 - 100.0 pg/mL  Troponin I  Result Value Ref Range   Troponin I 0.04 (HH) <0.03 ng/mL  CBC with Differential  Result Value Ref Range   WBC 6.8 4.0 - 10.5 K/uL   RBC 2.96 (L) 3.87 - 5.11 MIL/uL   Hemoglobin 8.7 (L) 12.0 - 15.0 g/dL   HCT 27.6 (L) 36.0 - 46.0 %   MCV 93.2 78.0 - 100.0 fL   MCH 29.4 26.0 - 34.0 pg   MCHC 31.5 30.0 - 36.0 g/dL   RDW 17.4 (H) 11.5 - 15.5 %  Platelets 179 150 - 400 K/uL   Neutrophils Relative % 70 %   Neutro Abs 4.8 1.7 - 7.7 K/uL   Lymphocytes Relative 17 %   Lymphs Abs 1.1 0.7 - 4.0 K/uL   Monocytes Relative 10 %   Monocytes Absolute 0.7 0.1 - 1.0 K/uL   Eosinophils Relative 2 %   Eosinophils Absolute 0.1 0.0 - 0.7 K/uL   Basophils Relative 1 %   Basophils Absolute 0.0 0.0 - 0.1 K/uL   Laboratory interpretation all normal except stable anemia, hyperglycemia, renal failure, mildly positive troponin consistent with renal disease, malnutrition    EKG  EKG Interpretation None       Radiology Dg Chest 1 View  Result Date:  01/28/2017 CLINICAL DATA:  Cough for 2 days. Shortness of breath. Former smoker. EXAM: CHEST 1 VIEW COMPARISON:  Chest radiograph January 22, 2017 FINDINGS: Stable cardiomegaly. Calcified aortic knob. Diffuse interstitial prominence. Blunting of LEFT costophrenic angle. No focal consolidation. No pneumothorax. Loop monitor projects in LEFT chest. Interval removal of RIGHT IJ venous catheter. Unchanged position of LEFT tunneled subclavian dialysis catheter with distal tip projecting mid superior vena cava. Soft tissue planes and included osseous structures are nonsuspicious. IMPRESSION: Stable cardiomegaly. Diffuse interstitial prominence seen with pulmonary edema and atypical infection without focal consolidation. Trace LEFT pleural effusion. Interval removal of RIGHT IJ catheter, stable appearance of LEFT dialysis catheter. Aortic Atherosclerosis (ICD10-I70.0). Electronically Signed   By: Elon Alas M.D.   On: 01/28/2017 00:46   Ct Head Wo Contrast  Result Date: 01/28/2017 CLINICAL DATA:  Recurrent vision changes and RIGHT hand stiffness. Discharged from hospital today for CHF. History of RIGHT carotid artery occlusion, hypertension, diabetes. EXAM: CT HEAD WITHOUT CONTRAST TECHNIQUE: Contiguous axial images were obtained from the base of the skull through the vertex without intravenous contrast. COMPARISON:  MRI of the head January 21, 2017 and CT HEAD January 19, 2017 FINDINGS: BRAIN: No intraparenchymal hemorrhage, mass effect nor midline shift. RIGHT mesial temporal occipital lobe encephalomalacia. RIGHT frontoparietal encephalomalacia. Multifocal small areas of LEFT parietal lobe encephalomalacia Confluent supratentorial pontine white matter FLAIR T2 hyperintensities. Asymmetrically smaller RIGHT cerebral peduncle consistent with wallerian degeneration. Hypodensities bilateral basal ganglia and thalamus associated with chronic small vessel ischemic disease. Old small LEFT cerebellar infarct. No  abnormal extra-axial fluid collections. Ex vacuo dilatation RIGHT lateral ventricle, mild parenchymal brain volume loss. No hydrocephalus. VASCULAR: Mild to moderate calcific atherosclerosis of the carotid siphons. SKULL: No skull fracture. No significant scalp soft tissue swelling. SINUSES/ORBITS: The mastoid air-cells and included paranasal sinuses are well-aerated.The included ocular globes and orbital contents are non-suspicious. Status post bilateral ocular lens implants. OTHER: None. IMPRESSION: 1. No acute intracranial process. 2. Old bilateral MCA and RIGHT PCA territory infarcts. Old small LEFT cerebellar infarct. 3. Severe chronic small vessel ischemic disease. 4. Mild parenchymal brain volume loss for age. Electronically Signed   By: Elon Alas M.D.   On: 01/28/2017 00:52      Ct Abdomen Pelvis Wo Contrast  Result Date: 01/07/2017 CLINICAL DATA:  Abdominal pain. Decreased hemoglobin. Clinical suspicion for retroperitoneal hemorrhage.  IMPRESSION: No evidence of retroperitoneal hemorrhage or hemoperitoneum. Several tiny calculi in left renal collecting system and proximal left ureter, without hydronephrosis. Bilateral renal parenchymal atrophy, left side greater than right. Colonic diverticulosis, without radiographic evidence of diverticulitis. Small bilateral pleural effusions and bilateral lower lobe airspace disease. 3.4 cm infrarenal abdominal aortic aneurysm. Recommend followup by ultrasound in 3 years. This recommendation follows ACR consensus guidelines: White Paper of  the ACR Incidental Findings Committee II on Vascular Findings. Natasha Mead Coll Radiol 2013; 10:789-794 Electronically Signed   By: Earle Gell M.D.   On: 01/07/2017 10:22   Dg Chest 2 View  Result Date: 01/06/2017 CLINICAL DATA:  Trouble breathing x1 day with tightness and congestion in chest. Weakness. Hx of diabetes, HTN-controlled with medication, and low hemoglobin. Patients left arm is paralyzed-best obtainable  lateral.IMPRESSION: Cardiomegaly and mild pulmonary edema. Electronically Signed   By: Nolon Nations M.D.   On: 01/06/2017 16:56   Ct Head Wo Contrast  Result Date: 01/19/2017 CLINICAL DATA:  Unexplained altered level of consciousness.  IMPRESSION: 1. Compared to 2015 there is patchy low-density in the left thalamus with mild expansion. This may reflect a recent infarct if there is associated deficit. MRI could characterize if clinically appropriate. 2. Advanced chronic ischemic injury as noted above. 3. 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 Electronically Signed   By: Monte Fantasia M.D.   On: 01/19/2017 15:50   Mr Brain Wo Contrast  Result Date: 01/21/2017 CLINICAL DATA:  66 year old female with altered mental status for 2 days. Abnormal density in the left thalamus new compared to 2015 on recent head CT.  IMPRESSION: 1. Possible small acute lacunar infarct in the right caudate nucleus. No associated hemorrhage or mass effect. 2. No other acute ischemia identified, but very severe chronic ischemic disease. Progression in the left deep gray matter nuclei and the left cerebellum since 2015. Right greater than left chronic MCA and right PCA territory encephalomalacia with advanced Wallerian degeneration in the right brainstem. 3. Chronic right ICA occlusion. Electronically Signed   By: Genevie Ann M.D.   On: 01/21/2017 07:56   US Carotid Bilateral  Result Date: 01/22/2017 CLINICAL DATA:  CVA. 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 Status post left carotid endarterectomy in 2014.Marland Kitchen IMPRESSION: Chronic occlusion of the right internal carotid artery. Surgical changes compatible with a left carotid endarterectomy. Mild plaque and tortuosity in the left internal carotid artery. Estimated degree of stenosis in the left internal carotid artery is less than 50%. Retrograde flow in the left vertebral artery. 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 This appears to be a chronic finding. Antegrade flow in the right vertebral artery. Electronically Signed   By: Markus Daft M.D.   On: 01/22/2017 08:59   US Venous Img Lower Bilateral  Result Date: 01/19/2017 CLINICAL DATA:  Bilateral lower extremity swelling.  IMPRESSION: No evidence of deep venous thrombosis. Electronically Signed   By: Rolm Baptise M.D.   On: 01/19/2017 15:35    Ir US Guide Vasc Access Left  Result Date: 01/14/2017 INDICATION: End-stage renal disease, in need of durable intravenous access for continuation of dialysis. EXAM: TUNNELED CENTRAL VENOUS HEMODIALYSIS CATHETER PLACEMENT  IMPRESSION: Successful placement of 23 cm tip to cuff tunneled hemodialysis catheter via the left internal jugular vein with tips terminating within the superior aspect of the right atrium. The catheter is ready for immediate use. Electronically Signed   By: Sandi Mariscal M.D.   On: 01/14/2017 15:28   Dg Chest Port 1 View  Result Date: 01/22/2017 CLINICAL DATA:  66 year old female with history of shortness of breath.  IMPRESSION: 1. Support apparatus, as above. 2. Overall, the appearance the chest suggests mild congestive heart failure, as discussed above. 3. Possible area of atelectasis and/or consolidation in the left lower lobe. 4. Aortic atherosclerosis. Electronically  Signed   By: Vinnie Langton M.D.   On: 01/22/2017 18:15   Dg Chest Port 1 View  Result Date: 01/20/2017 CLINICAL DATA:  Altered mental status short of breath IMPRESSION: Left jugular dual-lumen catheter in good position. Improvement in bilateral edema. Electronically Signed   By: Franchot Gallo M.D.   On: 01/20/2017 08:41   Dg Chest Port 1 View  Result Date: 01/09/2017 CLINICAL DATA:  CHF  IMPRESSION: Diffuse bilateral airspace disease unchanged. Probable heart failure. Electronically Signed   By: Franchot Gallo M.D.   On: 01/09/2017 11:27   Dg Chest Port 1  View  Result Date: 01/07/2017 CLINICAL DATA:  66 year old female central line placement.  IMPRESSION: 1. Right IJ central line tip at the lower SVC level. No pneumothorax or adverse features. 2. Stable ventilation since 1016 hours today. Electronically Signed   By: Genevie Ann M.D.   On: 01/07/2017 15:02   Dg Chest Port 1 View  Result Date: 01/07/2017 CLINICAL DATA:  Respiratory distress. . IMPRESSION: Slight worsening bilateral multifocal airspace process most notable over the left midlung and right upper lobe suggesting multifocal infection. Stable cardiomegaly. Electronically Signed   By: Marin Olp M.D.   On: 01/07/2017 10:37    Procedures .Critical Care Performed by: Rolland Porter, MD Authorized by: Rolland Porter, MD   Critical care provider statement:    Critical care time (minutes):  31   Critical care was necessary to treat or prevent imminent or life-threatening deterioration of the following conditions:  CNS failure or compromise   Critical care was time spent personally by me on the following activities:  Discussions with consultants, examination of patient, obtaining history from patient or surrogate, ordering and review of laboratory studies, ordering and review of radiographic studies, re-evaluation of patient's condition and review of old charts   (including critical care time)  Medications Ordered in ED Medications - No data to display   Initial Impression / Assessment and Plan / ED Course  I have reviewed the triage vital signs and the nursing notes.  Pertinent labs & imaging results that were available during my care of the patient were reviewed by me and considered in my medical decision making (see chart for details).   CT of the head was done, MRIs not available here at night, and laboratory testing was done.  Tele-neurology consult was ordered.  Patient appears to have had a TIA with transient neurological deficit.  Her symptoms have currently resolved.    12:58 AM Dr  Jonetta Speak, TeleNeurology recommends admission for MRI, consult GI to see if  ASA would be appropriate, even low dose.   1:59 AM Dr.Opyd, hospitalist will admit.  Patient and son were informed that she was going to be admitted, the plan is to get an MRI and talk to GI to see if she should restart a baby aspirin to prevent further strokes or TIAs.  Patient states she thinks she may have seen some more black disc but she is not sure.  Final Clinical Impressions(s) / ED Diagnoses   Final diagnoses:  TIA (transient ischemic attack)  Congestive heart failure, unspecified HF chronicity, unspecified heart failure type ([place])  ESRD on hemodialysis [facility])    Plan admission  Rolland Porter, MD, Barbette Or, MD 01/28/17 [id number]    Rolland Porter, MD 01/28/17 [phone]

## 2017-01-28 NOTE — Clinical Social Work Note (Signed)
Patient was discharged on 01/27/17 to [facility] for short term rehab.  She was brought back in to the hospital within hours due to concerns of a stroke.   [name] plan is to go back to [facility] at discharge. She is set up with [facility] in [place]. Her initial appointment at the facility was scheduled for tomorrow at 11:15.     Keia Rask, Clydene Pugh, LCSW

## 2017-01-28 NOTE — Progress Notes (Signed)
Patient refused stroke swallow screen at this time.  Patient has been kept npo.

## 2017-01-28 NOTE — Plan of Care (Signed)
  Acute Rehab PT Goals(only PT should resolve) Pt Will Go Supine/Side To Sit 01/28/2017 1431 - Progressing by Lonell Grandchild, PT Flowsheets Taken 01/28/2017 1431  Pt will go Supine/Side to Sit with minimal assist Pt Will Go Sit To Supine/Side 01/28/2017 1431 - Progressing by Lonell Grandchild, PT Flowsheets Taken 01/28/2017 1431  Pt will go Sit to Supine/Side with minimal assist Patient Will Transfer Sit To/From Stand 01/28/2017 1431 - Progressing by Lonell Grandchild, PT Flowsheets Taken 01/28/2017 1431  Patient will transfer sit to/from stand with minimal assist Pt Will Transfer Bed To Chair/Chair To Bed 01/28/2017 1431 - Progressing by Lonell Grandchild, PT Flowsheets Taken 01/28/2017 1431  Pt will Transfer Bed to Chair/Chair to Bed with min assist Pt Will Ambulate 01/28/2017 1431 - Progressing by Lonell Grandchild, PT Flowsheets Taken 01/28/2017 1431  Pt will Ambulate 10 feet;with cane;with moderate assist  2:33 PM, 01/28/17 Lonell Grandchild, MPT Physical Therapist with [facility] [phone] office [phone] mobile phone

## 2017-01-28 NOTE — Progress Notes (Signed)
Patient received dialysis today four hours with no complications. Blood pressure was stable and fluid goal removal met. Pt tolerated treatment.

## 2017-01-29 DIAGNOSIS — Z992 Dependence on renal dialysis: Secondary | ICD-10-CM | POA: Diagnosis not present

## 2017-01-29 DIAGNOSIS — G459 Transient cerebral ischemic attack, unspecified: Secondary | ICD-10-CM | POA: Diagnosis not present

## 2017-01-29 DIAGNOSIS — I5033 Acute on chronic diastolic (congestive) heart failure: Secondary | ICD-10-CM

## 2017-01-29 DIAGNOSIS — E1169 Type 2 diabetes mellitus with other specified complication: Secondary | ICD-10-CM | POA: Diagnosis not present

## 2017-01-29 DIAGNOSIS — I10 Essential (primary) hypertension: Secondary | ICD-10-CM | POA: Diagnosis not present

## 2017-01-29 DIAGNOSIS — N186 End stage renal disease: Secondary | ICD-10-CM | POA: Diagnosis not present

## 2017-01-29 DIAGNOSIS — E669 Obesity, unspecified: Secondary | ICD-10-CM | POA: Diagnosis not present

## 2017-01-29 LAB — GLUCOSE, CAPILLARY
Glucose-Capillary: 118 mg/dL — ABNORMAL HIGH (ref 65–99)
Glucose-Capillary: 212 mg/dL — ABNORMAL HIGH (ref 65–99)
Glucose-Capillary: 98 mg/dL (ref 65–99)
Glucose-Capillary: 209 mg/dL — ABNORMAL HIGH (ref 65–99)

## 2017-01-29 MED ORDER — ALTEPLASE 2 MG IJ SOLR
2.0000 mg | Freq: Once | INTRAMUSCULAR | Status: DC | PRN
  Filled 2017-01-29: qty 2

## 2017-01-29 MED ORDER — HEPARIN SODIUM (PORCINE) 1000 UNIT/ML DIALYSIS
1000.0000 [IU] | INTRAMUSCULAR | Status: DC | PRN
  Administered 2017-01-29: 1000 [IU] via INTRAVENOUS_CENTRAL
  Filled 2017-01-29 (×2): qty 1

## 2017-01-29 MED ORDER — ONDANSETRON HCL 4 MG/2ML IJ SOLN
4.0000 mg | Freq: Four times a day (QID) | INTRAMUSCULAR | Status: DC | PRN
  Administered 2017-01-29: 4 mg via INTRAVENOUS
  Filled 2017-01-29: qty 2

## 2017-01-29 MED ORDER — SODIUM CHLORIDE 0.9 % IV SOLN: 100.0000 mL | INTRAVENOUS | Status: DC | PRN

## 2017-01-29 MED ORDER — LIDOCAINE-PRILOCAINE 2.5-2.5 % EX CREA: 1.0000 "application " | TOPICAL_CREAM | CUTANEOUS | Status: DC | PRN

## 2017-01-29 MED ORDER — HEPARIN SODIUM (PORCINE) 1000 UNIT/ML IJ SOLN
INTRAMUSCULAR | Status: AC
  Administered 2017-01-29: 1000 [IU] via INTRAVENOUS_CENTRAL
  Filled 2017-01-29: qty 4

## 2017-01-29 MED ORDER — LIDOCAINE HCL (PF) 1 % IJ SOLN: 5.0000 mL | INTRAMUSCULAR | Status: DC | PRN

## 2017-01-29 MED ORDER — PENTAFLUOROPROP-TETRAFLUOROETH EX AERO: 1.0000 "application " | INHALATION_SPRAY | CUTANEOUS | Status: DC | PRN

## 2017-01-29 NOTE — Progress Notes (Signed)
Pt received dialysis today. No complications, UF goal met of 1500cc. Pt tolerated treatment well using her left dialysis catheter.

## 2017-01-29 NOTE — Progress Notes (Signed)
PROGRESS NOTE    Kelli [name]  OFB:[id number] DOB: 03-21-1950 DOA: 01/27/2017 PCP: Manon Hilding, MD    Brief Narrative:  66 year old female who was recently discharged from the hospital after a prolonged hospital stay.  She was end-stage renal disease on hemodialysis.  Last hospitalization was complicated by development of GI bleeding requiring 6 units of PRBC.  She was also found to have an acute stroke, but was not started on antiplatelet agents or anticoagulation due to issues with GI bleeding.  After discharge, she began having visual disturbances and right hand weakness concerning for possible TIA.  She was readmitted and seen by neurology/gastroenterology who agreed to start the patient on Plavix while closely monitoring her hemoglobin.   Assessment & Plan:   Principal Problem:   TIA (transient ischemic attack) Active Problems:   HTN (hypertension)   History of stroke   Normocytic anemia   Elevated troponin   Acute on chronic diastolic CHF (congestive heart failure) (HCC)   Diabetes mellitus type 2 in obese (HCC)   ESRD (end stage renal disease) ([place])   Occult GI bleeding   Congestive heart failure ([place])   ESRD on hemodialysis ([place])   1. Acute visual disturbance/right hand weakness.  Concern for TIA versus seizure.  MRI brain did not show any new infarct.  Seen by neurology who recommended Plavix for now.  If she does not have any evidence of bleeding and hemoglobin remains stable, should consider replacing Plavix with Eliquis in the next 2 weeks.  She recently had acute CVA and had under gone a complete workup.  EEG to be arranged as an outpatient.  She was also seen by gastroenterology who did not recommend aspirin at this time, but felt starting Plavix would be an acceptable risk considering her recent stroke. 2. Atrial fibrillation.  Currently in regular rhythm.  Not on anticoagulation due to history of GI bleeding.  Can consider anticoagulation in the next 2 weeks if  hemoglobin remains stable.  Currently on Plavix.  She is started on anticoagulation, this would replace Plavix. 3. Anemia.  Recent GI bleeding.  Hemoglobin is currently stable.  No evidence of recurrent bleeding.  Will need serial CBCs. 4. End-stage renal disease.  Seen by nephrology and is undergoing dialysis.  She is set up on a Tuesday, Thursday and Saturday schedule.  She will undergo dialysis today. 5. Hypertension.  Blood pressure improving after receiving dialysis yesterday.   DVT prophylaxis: Heparin Code Status: DNR Family Communication: Discussed with husband at the bedside Disposition Plan: Discharge back to skilled nursing facility, likely in a.m.   Consultants:   Nephrology  Neurology  Gastroenterology  Procedures:     Antimicrobials:       Subjective: Feeling better.  No further weakness in right hand.  No shortness of breath.  Objective: Vitals:   01/29/17 1600 01/29/17 1630 01/29/17 1700 01/29/17 1730  BP: (!) 197/85 (!) 165/78 (!) 164/59 (!) 168/82  Pulse: 93 81 82 76  Resp:      Temp:      TempSrc:      SpO2:      Weight:      Height:        Intake/Output Summary (Last 24 hours) at 01/29/2017 1759 Last data filed at 01/29/2017 0900 Gross per 24 hour  Intake 0 ml  Output 3000 ml  Net -3000 ml   Filed Weights   01/28/17 0300 01/28/17 1630 01/29/17 1515  Weight: 82.2 kg (181 lb 3.5 oz)  82.2 kg (181 lb 3.5 oz) 82.2 kg (181 lb 3.5 oz)    Examination:  General exam: Appears calm and comfortable  Respiratory system: Clear to auscultation. Respiratory effort normal. Cardiovascular system: S1 & S2 heard, RRR. No JVD, murmurs, rubs, gallops or clicks. No pedal edema. Gastrointestinal system: Abdomen is nondistended, soft and nontender. No organomegaly or masses felt. Normal bowel sounds heard. Central nervous system: Alert and oriented. No new focal neurological deficits. Extremities: Symmetric no cyanosis or clubbing. Skin: No rashes, lesions or  ulcers Psychiatry: Judgement and insight appear normal. Mood & affect appropriate.     Data Reviewed: I have personally reviewed following labs and imaging studies  CBC: Recent Labs  Lab 01/25/17 0555 01/25/17 1533 01/26/17 0613 01/27/17 0452 01/27/17 2340  WBC 7.1 6.6 6.4 7.5 6.8  NEUTROABS  --   --   --   --  4.8  HGB 8.7* 8.7* 7.9* 8.0* 8.7*  HCT 27.8* 27.4* 25.4* 25.8* 27.6*  MCV 93.3 91.9 92.7 92.8 93.2  PLT 147* 138* 141* 159 427   Basic Metabolic Panel: Recent Labs  Lab 01/25/17 0555 01/25/17 1533 01/26/17 0613 01/27/17 0452 01/27/17 2340  NA 139 138 138 137 135  K 4.0 3.2* 3.7 4.1 4.1  CL 105 102 102 101 98*  CO2 23 28 27 25 27   GLUCOSE 142* 214* 142* 129* 204*  BUN 68* 28* 50* 63* 35*  CREATININE 5.00* 2.48* 3.78* 4.55* 3.15*  CALCIUM 7.0* 7.3* 6.8* 6.7* 7.0*  PHOS 4.6 2.6 4.2  --   --    GFR: Estimated Creatinine Clearance: 18.6 mL/min (A) (by C-G formula based on SCr of 3.15 mg/dL (H)). Liver Function Tests: Recent Labs  Lab 01/25/17 0555 01/25/17 1533 01/26/17 0613 01/27/17 2340  AST  --   --   --  16  ALT  --   --   --  10*  ALKPHOS  --   --   --  70  BILITOT  --   --   --  0.5  PROT  --   --   --  5.8*  ALBUMIN 2.7* 2.7* 2.5* 2.9*   No results for input(s): LIPASE, AMYLASE in the last 168 hours. No results for input(s): AMMONIA in the last 168 hours. Coagulation Profile: No results for input(s): INR, PROTIME in the last 168 hours. Cardiac Enzymes: Recent Labs  Lab 01/27/17 2340 01/28/17 1115  TROPONINI 0.04* 0.04*   BNP (last 3 results) No results for input(s): PROBNP in the last 8760 hours. HbA1C: No results for input(s): HGBA1C in the last 72 hours. CBG: Recent Labs  Lab 01/28/17 1638 01/28/17 2154 01/29/17 0746 01/29/17 1124 01/29/17 1646  GLUCAP 120* 137* 118* 212* 98   Lipid Profile: No results for input(s): CHOL, HDL, LDLCALC, TRIG, CHOLHDL, LDLDIRECT in the last 72 hours. Thyroid Function Tests: No results for  input(s): TSH, T4TOTAL, FREET4, T3FREE, THYROIDAB in the last 72 hours. Anemia Panel: No results for input(s): VITAMINB12, FOLATE, FERRITIN, TIBC, IRON, RETICCTPCT in the last 72 hours. Sepsis Labs: No results for input(s): PROCALCITON, LATICACIDVEN in the last 168 hours.  Recent Results (from the past 240 hour(s))  MRSA PCR Screening     Status: None   Collection Time: 01/22/17  7:21 PM  Result Value Ref Range Status   MRSA by PCR NEGATIVE NEGATIVE Final    Comment:        The GeneXpert MRSA Assay (FDA approved for NASAL specimens only), is one component of a comprehensive  MRSA colonization surveillance program. It is not intended to diagnose MRSA infection nor to guide or monitor treatment for MRSA infections.          Radiology Studies: Dg Chest 1 View  Result Date: 01/28/2017 CLINICAL DATA:  Cough for 2 days. Shortness of breath. Former smoker. EXAM: CHEST 1 VIEW COMPARISON:  Chest radiograph January 22, 2017 FINDINGS: Stable cardiomegaly. Calcified aortic knob. Diffuse interstitial prominence. Blunting of LEFT costophrenic angle. No focal consolidation. No pneumothorax. Loop monitor projects in LEFT chest. Interval removal of RIGHT IJ venous catheter. Unchanged position of LEFT tunneled subclavian dialysis catheter with distal tip projecting mid superior vena cava. Soft tissue planes and included osseous structures are nonsuspicious. IMPRESSION: Stable cardiomegaly. Diffuse interstitial prominence seen with pulmonary edema and atypical infection without focal consolidation. Trace LEFT pleural effusion. Interval removal of RIGHT IJ catheter, stable appearance of LEFT dialysis catheter. Aortic Atherosclerosis (ICD10-I70.0). Electronically Signed   By: Elon Alas M.D.   On: 01/28/2017 00:46   Ct Head Wo Contrast  Result Date: 01/28/2017 CLINICAL DATA:  Recurrent vision changes and RIGHT hand stiffness. Discharged from hospital today for CHF. History of RIGHT carotid  artery occlusion, hypertension, diabetes. EXAM: CT HEAD WITHOUT CONTRAST TECHNIQUE: Contiguous axial images were obtained from the base of the skull through the vertex without intravenous contrast. COMPARISON:  MRI of the head January 21, 2017 and CT HEAD January 19, 2017 FINDINGS: BRAIN: No intraparenchymal hemorrhage, mass effect nor midline shift. RIGHT mesial temporal occipital lobe encephalomalacia. RIGHT frontoparietal encephalomalacia. Multifocal small areas of LEFT parietal lobe encephalomalacia Confluent supratentorial pontine white matter FLAIR T2 hyperintensities. Asymmetrically smaller RIGHT cerebral peduncle consistent with wallerian degeneration. Hypodensities bilateral basal ganglia and thalamus associated with chronic small vessel ischemic disease. Old small LEFT cerebellar infarct. No abnormal extra-axial fluid collections. Ex vacuo dilatation RIGHT lateral ventricle, mild parenchymal brain volume loss. No hydrocephalus. VASCULAR: Mild to moderate calcific atherosclerosis of the carotid siphons. SKULL: No skull fracture. No significant scalp soft tissue swelling. SINUSES/ORBITS: The mastoid air-cells and included paranasal sinuses are well-aerated.The included ocular globes and orbital contents are non-suspicious. Status post bilateral ocular lens implants. OTHER: None. IMPRESSION: 1. No acute intracranial process. 2. Old bilateral MCA and RIGHT PCA territory infarcts. Old small LEFT cerebellar infarct. 3. Severe chronic small vessel ischemic disease. 4. Mild parenchymal brain volume loss for age. Electronically Signed   By: Elon Alas M.D.   On: 01/28/2017 00:52   Mr Brain Wo Contrast  Result Date: 01/28/2017 CLINICAL DATA:  TIA were with vision change and right hand stiffness EXAM: MRI HEAD WITHOUT CONTRAST TECHNIQUE: Multiplanar, multiecho pulse sequences of the brain and surrounding structures were obtained without intravenous contrast. COMPARISON:  CT head 01/27/2017.  MRI  01/21/2017 FINDINGS: Brain: Negative for acute infarct. Moderate atrophy. Extensive chronic ischemic changes. Extensive chronic microvascular ischemia throughout the white matter bilaterally. Chronic infarct in the right midbrain. Chronic ischemia in the pons bilaterally. Chronic infarct left thalamus and basal ganglia bilaterally. Small chronic infarct in the left cerebellum. Chronic right occipital infarct. Chronic hemorrhagic infarct right posterior frontal lobe. Negative for mass or edema or midline shift. Vascular: Loss of flow void right internal carotid artery unchanged compatible with occlusion. Otherwise normal flow voids Skull and upper cervical spine: Negative Sinuses/Orbits: Mild mucosal edema paranasal sinuses. Bilateral cataract removal Other: None IMPRESSION: Negative for acute infarct Atrophy and advanced chronic ischemic changes. Chronic occlusion right internal carotid artery. Electronically Signed   By: Franchot Gallo M.D.   On: 01/28/2017  08:49     Scheduled Meds: .  stroke: mapping our early stages of recovery book   Does not apply Once  . atorvastatin  40 mg Oral q1800  . budesonide  0.5 mg Nebulization BID  . calcitRIOL  0.25 mcg Oral Daily  . clopidogrel  75 mg Oral Daily  . [START ON 01/31/2017] epoetin (EPOGEN/PROCRIT) injection  8,000 Units Subcutaneous Q M,W,F-HD  . feeding supplement  1 Container Oral TID BM  . heparin  5,000 Units Subcutaneous Q8H  . insulin aspart  0-15 Units Subcutaneous TID WC  . insulin aspart  0-5 Units Subcutaneous QHS  . loratadine  10 mg Oral Daily  . pantoprazole  40 mg Oral BID AC  . venlafaxine XR  150 mg Oral Q breakfast   Continuous Infusions: . sodium chloride    . sodium chloride    . sodium chloride    . sodium chloride       LOS: 0 days    Time spent: 109mins    Kathie Dike, MD [facility] Pager [phone]  If 7PM-7AM, please contact night-coverage [email or web address] Password [email or web address] 01/29/2017, 5:59 PM

## 2017-01-29 NOTE — Progress Notes (Addendum)
Kelli [name]  MRN: [id number]  DOB/AGE: 06-04-1950 66 y.o.  Primary Care Physician:[name], [name], MD  Admit date: 01/27/2017  Chief Complaint:  Chief Complaint  Patient presents with  . Eye Problem    [name]-Pt presented on  01/27/2017 with  Chief Complaint  Patient presents with  . Eye Problem  .    Pt offers no new complaints .     Meds .  stroke: mapping our early stages of recovery book   Does not apply Once  . atorvastatin  40 mg Oral q1800  . budesonide  0.5 mg Nebulization BID  . calcitRIOL  0.25 mcg Oral Daily  . clopidogrel  75 mg Oral Daily  . [START ON 01/31/2017] epoetin (EPOGEN/PROCRIT) injection  8,000 Units Subcutaneous Q M,W,F-HD  . feeding supplement  1 Container Oral TID BM  . heparin  5,000 Units Subcutaneous Q8H  . insulin aspart  0-15 Units Subcutaneous TID WC  . insulin aspart  0-5 Units Subcutaneous QHS  . loratadine  10 mg Oral Daily  . pantoprazole  40 mg Oral BID AC  . venlafaxine XR  150 mg Oral Q breakfast      Physical Exam: Vital signs in last 24 hours: Temp:  [97.9 F (36.6 C)-98.8 F (37.1 C)] 98.4 F (36.9 C) (12/01 0900) Pulse Rate:  [56-103] 96 (12/01 0900) Resp:  [16-18] 18 (12/01 0900) BP: (119-208)/(57-95) 174/67 (12/01 0900) SpO2:  [95 %-99 %] 98 % (12/01 0900) Weight:  [181 lb 3.5 oz (82.2 kg)] 181 lb 3.5 oz (82.2 kg) (11/30 1630) Weight change: -12.5 oz (-4.891 kg) Last BM Date: 01/26/17  Intake/Output from previous day: 11/30 0701 - 12/01 0700 In: 360 [P.O.:360] Out: 3000  No intake/output data recorded.   Physical Exam: General- pt is awake ,alert, follows cooamnsd Resp- No acute REsp distress, Rhonchi+( much better than yesterday)  CVS- S1S2 regular in rate and rhythm GIT- BS+, soft, NT, ND EXT- NO LE Edema, no Cyanosis Access-Permacath in situ  Lab Results: CBC    Component Value Date/Time   WBC 6.8 01/27/2017 2340   RBC 2.96 (L) 01/27/2017 2340   HGB 8.7 (L) 01/27/2017 2340   HCT 27.6 (L) 01/27/2017  2340   PLT 179 01/27/2017 2340   MCV 93.2 01/27/2017 2340   MCH 29.4 01/27/2017 2340   MCHC 31.5 01/27/2017 2340   RDW 17.4 (H) 01/27/2017 2340   LYMPHSABS 1.1 01/27/2017 2340   MONOABS 0.7 01/27/2017 2340   EOSABS 0.1 01/27/2017 2340   BASOSABS 0.0 01/27/2017 2340       BMET Recent Labs    01/27/17 0452 01/27/17 2340  NA 137 135  K 4.1 4.1  CL 101 98*  CO2 25 27  GLUCOSE 129* 204*  BUN 63* 35*  CREATININE 4.55* 3.15*  CALCIUM 6.7* 7.0*   Creat trend 2018   5.0=>5.6=>5.86=> now ESRD   In August admision4.5--5.0 2016   1.4--1.5 2015   1.3--1.7 2014   1.1--2.0 ( AKi)    Lab Results  Component Value Date   PTH 162 (H) 10/14/2016   PTH Comment 10/14/2016   CALCIUM 7.0 (L) 01/27/2017   CAION 1.14 04/08/2013   PHOS 4.2 01/26/2017     ANA Positive Anti dsDNA negative Complements normal ANCA negative M spike negative Free kappa/lamda chain ratio- 2.08  Alb  2.7 Corrected calcium  7.0+ 1.0=8.0  Impression: 1)Renal                 Now ESRD.  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  3)Anemia HGb not at goal (9--11) but better Pt has Received prbc. Hospital course marked with GI bleeding On Epo during HD  4)CKD Mineral-Bone Disorder PTH elevated. Secondary Hyperparathyroidism- present.    On calcitriol Phosphorus now at  goal. Calcium now at goal  5)DM PMD following  6)Electrolytes  normokalemic normonatremic   7)Acid base Co2 now at goal     Plan:  Will conitnue current care  Addendum Pt seen on HD     Kelli [name] 01/29/2017,  10:42 AM

## 2017-01-30 DIAGNOSIS — N186 End stage renal disease: Secondary | ICD-10-CM | POA: Diagnosis not present

## 2017-01-30 DIAGNOSIS — G459 Transient cerebral ischemic attack, unspecified: Secondary | ICD-10-CM | POA: Diagnosis not present

## 2017-01-30 DIAGNOSIS — I5033 Acute on chronic diastolic (congestive) heart failure: Secondary | ICD-10-CM | POA: Diagnosis not present

## 2017-01-30 DIAGNOSIS — E1169 Type 2 diabetes mellitus with other specified complication: Secondary | ICD-10-CM | POA: Diagnosis not present

## 2017-01-30 LAB — BASIC METABOLIC PANEL
Anion gap: 10 (ref 5–15)
BUN: 25 mg/dL — AB (ref 6–20)
CHLORIDE: 99 mmol/L — AB (ref 101–111)
CO2: 28 mmol/L (ref 22–32)
CREATININE: 2.91 mg/dL — AB (ref 0.44–1.00)
Calcium: 7.4 mg/dL — ABNORMAL LOW (ref 8.9–10.3)
GFR calc Af Amer: 18 mL/min — ABNORMAL LOW (ref >60)
GFR calc non Af Amer: 16 mL/min — ABNORMAL LOW (ref >60)
GLUCOSE: 122 mg/dL — AB (ref 65–99)
Potassium: 3.7 mmol/L (ref 3.5–5.1)
Sodium: 137 mmol/L (ref 135–145)

## 2017-01-30 LAB — CBC
HEMATOCRIT: 27.2 % — AB (ref 36.0–46.0)
HEMOGLOBIN: 8.5 g/dL — AB (ref 12.0–15.0)
MCH: 28.7 pg (ref 26.0–34.0)
MCHC: 31.3 g/dL (ref 30.0–36.0)
MCV: 91.9 fL (ref 78.0–100.0)
Platelets: 246 10*3/uL (ref 150–400)
RBC: 2.96 MIL/uL — ABNORMAL LOW (ref 3.87–5.11)
RDW: 17.9 % — ABNORMAL HIGH (ref 11.5–15.5)
WBC: 5.1 10*3/uL (ref 4.0–10.5)

## 2017-01-30 LAB — GLUCOSE, CAPILLARY
GLUCOSE-CAPILLARY: 188 mg/dL — AB (ref 65–99)
Glucose-Capillary: 115 mg/dL — ABNORMAL HIGH (ref 65–99)
Glucose-Capillary: 115 mg/dL — ABNORMAL HIGH (ref 65–99)

## 2017-01-30 MED ORDER — CLOPIDOGREL BISULFATE 75 MG PO TABS: 75.0000 mg | ORAL_TABLET | Freq: Every day | ORAL | Status: DC

## 2017-01-30 MED ORDER — CALCITRIOL 0.25 MCG PO CAPS: 0.2500 ug | ORAL_CAPSULE | Freq: Every day | ORAL | 0 refills | Status: DC

## 2017-01-30 NOTE — Progress Notes (Signed)
Report called to [facility].

## 2017-01-30 NOTE — Discharge Summary (Signed)
Physician Discharge Summary  Kelli [name] VWU:[id number] DOB: 01-Nov-1950 DOA: 01/27/2017  PCP: Manon Hilding, MD  Admit date: 01/27/2017 Discharge date: 01/30/2017  Admitted From: Skilled nursing facility Disposition: Skilled nursing facility  Recommendations for Outpatient Follow-up:  1. Follow up with PCP in 1-2 weeks 2. Please obtain BMP/CBC in one week 3. Follow-up at outpatient dialysis center on 12/4 as previously scheduled 4. Monitor CBC twice a week to ensure stability 5. Please refer patient for outpatient EEG.   Discharge Condition: Stable CODE STATUS: DNR Diet recommendation: Heart Healthy / Carb Modified  Brief/Interim Summary: 66 year old female who was recently discharged from the hospital after a prolonged hospital stay.  She was end-stage renal disease on hemodialysis.  Last hospitalization was complicated by development of GI bleeding requiring 6 units of PRBC.  She was also found to have an acute stroke, but was not started on antiplatelet agents or anticoagulation due to issues with GI bleeding.  After discharge, she began having visual disturbances and right hand weakness concerning for possible TIA.  She was readmitted and seen by neurology/gastroenterology who agreed to start the patient on Plavix while closely monitoring her hemoglobin.    Discharge Diagnoses:  Principal Problem:   TIA (transient ischemic attack) Active Problems:   HTN (hypertension)   History of stroke   Normocytic anemia   Elevated troponin   Acute on chronic diastolic CHF (congestive heart failure) (HCC)   Diabetes mellitus type 2 in obese (HCC)   ESRD (end stage renal disease) ([place])   Occult GI bleeding   Congestive heart failure ([place])   ESRD on hemodialysis ([place])  1. Acute visual disturbance/right hand weakness.  Concern for TIA versus seizure.  MRI brain did not show any new infarct.  Seen by neurology who recommended Plavix for now.  If she does not develop any evidence of  bleeding and hemoglobin remains stable, should consider replacing Plavix with Eliquis after 2 weeks.  She recently had acute CVA and had under gone a complete workup.  EEG needs to be arranged as an outpatient.  She was also seen by gastroenterology who did not recommend aspirin at this time, but felt starting Plavix would be an acceptable risk considering her recent stroke. 2. Atrial fibrillation.  Currently in regular rhythm.  Not on anticoagulation due to history of GI bleeding.  Can consider anticoagulation in the next 2 weeks if hemoglobin remains stable.  Currently on Plavix.  If she is started on anticoagulation, this would replace Plavix. 3. Anemia.  Recent GI bleeding.  Hemoglobin is currently stable.  No evidence of recurrent bleeding.  Will need serial CBCs. 4. End-stage renal disease.  Seen by nephrology and is undergoing dialysis.  She is set up on a Tuesday, Thursday and Saturday schedule.   5. Hypertension.  Blood pressure improving after receiving dialysis yesterday.   Discharge Instructions  Discharge Instructions    Diet - low sodium heart healthy   Complete by:  As directed    Increase activity slowly   Complete by:  As directed      Allergies as of 01/30/2017      Reactions   Hydrochlorothiazide Nausea And Vomiting      Medication List    STOP taking these medications   aspirin EC 81 MG tablet   potassium chloride SA 20 MEQ tablet Commonly known as:  K-DUR,KLOR-CON     TAKE these medications   amLODipine 10 MG tablet Commonly known as:  NORVASC Take 10 mg by  mouth daily.   atorvastatin 40 MG tablet Commonly known as:  LIPITOR Take 1 tablet (40 mg total) by mouth daily at 6 PM.   budesonide 0.5 MG/2ML nebulizer solution Commonly known as:  PULMICORT Take 2 mLs (0.5 mg total) by nebulization 2 (two) times daily.   calcitRIOL 0.25 MCG capsule Commonly known as:  ROCALTROL Take 1 capsule (0.25 mcg total) by mouth daily.   clopidogrel 75 MG  tablet Commonly known as:  PLAVIX Take 1 tablet (75 mg total) by mouth daily. Start taking on:  01/31/2017   epoetin alfa 10000 UNIT/ML injection Commonly known as:  EPOGEN,PROCRIT Inject 1 mL (10,000 Units total) into the vein Every Tuesday,Thursday,and Saturday with dialysis.   feeding supplement Liqd Take 1 Container by mouth 3 (three) times daily between meals.   guaiFENesin-dextromethorphan 100-10 MG/5ML syrup Commonly known as:  ROBITUSSIN DM Take 5 mLs by mouth every 4 (four) hours as needed for cough.   hydrALAZINE 50 MG tablet Commonly known as:  APRESOLINE Take 1 tablet (50 mg total) by mouth 3 (three) times daily.   insulin aspart 100 UNIT/ML injection Commonly known as:  novoLOG Inject 0-15 Units into the skin 3 (three) times daily with meals.   ipratropium-albuterol 0.5-2.5 (3) MG/3ML Soln Commonly known as:  DUONEB Take 3 mLs by nebulization every 6 (six) hours as needed.   loratadine 10 MG tablet Commonly known as:  CLARITIN Take 1 tablet (10 mg total) by mouth daily.   metoprolol tartrate 50 MG tablet Commonly known as:  LOPRESSOR Take 1 tablet (50 mg total) by mouth 2 (two) times daily.   pantoprazole 40 MG tablet Commonly known as:  PROTONIX Take 1 tablet (40 mg total) by mouth 2 (two) times daily before a meal.   traMADol 50 MG tablet Commonly known as:  ULTRAM Take 1 tablet (50 mg total) by mouth every 12 (twelve) hours as needed for moderate pain.   triamcinolone cream 0.1 % Commonly known as:  KENALOG Apply 1 application topically 3 (three) times daily.   venlafaxine XR 150 MG 24 hr capsule Commonly known as:  EFFEXOR-XR Take 150 mg by mouth daily with breakfast.       Allergies  Allergen Reactions  . Hydrochlorothiazide Nausea And Vomiting    Consultations:  Gastroenterology  Neurology  Nephrology   Procedures/Studies: Ct Abdomen Pelvis Wo Contrast  Result Date: 01/07/2017 CLINICAL DATA:  Abdominal pain. Decreased  hemoglobin. Clinical suspicion for retroperitoneal hemorrhage. EXAM: CT ABDOMEN AND PELVIS WITHOUT CONTRAST TECHNIQUE: Multidetector CT imaging of the abdomen and pelvis was performed following the standard protocol without IV contrast. COMPARISON:  None. FINDINGS: Lower chest: Bibasilar heterogeneous airspace disease and small bilateral pleural effusions. Hepatobiliary: No mass visualized on this unenhanced exam. Gallbladder is unremarkable. Pancreas: No mass or inflammatory process visualized on this unenhanced exam. Spleen:  Within normal limits in size. Adrenals/Urinary tract: Bilateral renal parenchymal atrophy, left side greater than right, consistent with history of chronic kidney disease. Renal vascular calcification noted. Several tiny calculi are seen within the left renal collecting system and proximal left ureter, without hydronephrosis. Unremarkable unopacified urinary bladder. Stomach/Bowel: No evidence of obstruction, inflammatory process, or abnormal fluid collections. Normal appendix visualized. Mild left-sided colonic diverticulosis seen, without evidence of diverticulitis. Vascular/Lymphatic: No pathologically enlarged lymph nodes identified. Infrarenal abdominal aortic aneurysm measuring 3.4 x 3.3 cm. Reproductive: Prior hysterectomy noted. Adnexal regions are unremarkable in appearance. Other: No evidence of retroperitoneal hemorrhage or hemoperitoneum. Mild edema in subcutaneous tissues. Musculoskeletal:  No suspicious  bone lesions identified. IMPRESSION: No evidence of retroperitoneal hemorrhage or hemoperitoneum. Several tiny calculi in left renal collecting system and proximal left ureter, without hydronephrosis. Bilateral renal parenchymal atrophy, left side greater than right. Colonic diverticulosis, without radiographic evidence of diverticulitis. Small bilateral pleural effusions and bilateral lower lobe airspace disease. 3.4 cm infrarenal abdominal aortic aneurysm. Recommend followup by  ultrasound in 3 years. This recommendation follows ACR consensus guidelines: White Paper of the ACR Incidental Findings Committee II on Vascular Findings. Natasha [facility] Coll Radiol 2013; 10:789-794 Electronically Signed   By: Earle Gell M.D.   On: 01/07/2017 10:22   Dg Chest 1 View  Result Date: 01/28/2017 CLINICAL DATA:  Cough for 2 days. Shortness of breath. Former smoker. EXAM: CHEST 1 VIEW COMPARISON:  Chest radiograph January 22, 2017 FINDINGS: Stable cardiomegaly. Calcified aortic knob. Diffuse interstitial prominence. Blunting of LEFT costophrenic angle. No focal consolidation. No pneumothorax. Loop monitor projects in LEFT chest. Interval removal of RIGHT IJ venous catheter. Unchanged position of LEFT tunneled subclavian dialysis catheter with distal tip projecting mid superior vena cava. Soft tissue planes and included osseous structures are nonsuspicious. IMPRESSION: Stable cardiomegaly. Diffuse interstitial prominence seen with pulmonary edema and atypical infection without focal consolidation. Trace LEFT pleural effusion. Interval removal of RIGHT IJ catheter, stable appearance of LEFT dialysis catheter. Aortic Atherosclerosis (ICD10-I70.0). Electronically Signed   By: Elon Alas M.D.   On: 01/28/2017 00:46   Dg Chest 2 View  Result Date: 01/06/2017 CLINICAL DATA:  Trouble breathing x1 day with tightness and congestion in chest. Weakness. Hx of diabetes, HTN-controlled with medication, and low hemoglobin. 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 EXAM: CHEST  2 VIEW COMPARISON:  10/19/2016 FINDINGS: The heart is enlarged. There is pulmonary vascular congestion. Prominent interstitial markings are consistent with mild edema. Trace bilateral pleural effusions are also suspected. There is mild bibasilar atelectasis. IMPRESSION: Cardiomegaly and mild pulmonary edema. Electronically Signed   By: Nolon Nations M.D.   On: 01/06/2017 16:56   Ct Head Wo Contrast  Result Date:  01/28/2017 CLINICAL DATA:  Recurrent vision changes and RIGHT hand stiffness. Discharged from hospital today for CHF. History of RIGHT carotid artery occlusion, hypertension, diabetes. EXAM: CT HEAD WITHOUT CONTRAST TECHNIQUE: Contiguous axial images were obtained from the base of the skull through the vertex without intravenous contrast. COMPARISON:  MRI of the head January 21, 2017 and CT HEAD January 19, 2017 FINDINGS: BRAIN: No intraparenchymal hemorrhage, mass effect nor midline shift. RIGHT mesial temporal occipital lobe encephalomalacia. RIGHT frontoparietal encephalomalacia. Multifocal small areas of LEFT parietal lobe encephalomalacia Confluent supratentorial pontine white matter FLAIR T2 hyperintensities. Asymmetrically smaller RIGHT cerebral peduncle consistent with wallerian degeneration. Hypodensities bilateral basal ganglia and thalamus associated with chronic small vessel ischemic disease. Old small LEFT cerebellar infarct. No abnormal extra-axial fluid collections. Ex vacuo dilatation RIGHT lateral ventricle, mild parenchymal brain volume loss. No hydrocephalus. VASCULAR: Mild to moderate calcific atherosclerosis of the carotid siphons. SKULL: No skull fracture. No significant scalp soft tissue swelling. SINUSES/ORBITS: The mastoid air-cells and included paranasal sinuses are well-aerated.The included ocular globes and orbital contents are non-suspicious. Status post bilateral ocular lens implants. OTHER: None. IMPRESSION: 1. No acute intracranial process. 2. Old bilateral MCA and RIGHT PCA territory infarcts. Old small LEFT cerebellar infarct. 3. Severe chronic small vessel ischemic disease. 4. Mild parenchymal brain volume loss for age. Electronically Signed   By: Elon Alas M.D.   On: 01/28/2017 00:52   Ct Head Wo Contrast  Result Date: 01/19/2017 CLINICAL DATA:  Unexplained altered level of consciousness. EXAM: CT HEAD WITHOUT CONTRAST TECHNIQUE: Contiguous axial images were  obtained from the base of the skull through the vertex without intravenous contrast. COMPARISON:  Brain MRI 09/17/2013 FINDINGS: Brain: Confluent low-density in the cerebral white matter consistent with chronic small vessel ischemia. Remote moderate to large right PCA distribution affecting the inferior temporal and occipital lobes. Small remote high right posterior frontal and left parietal cortex infarcts. Patchy indistinct bilateral deep gray nuclei consistent with chronic small vessel ischemic injury. Remote right pontine infarct. Remote small vessel left cerebellar infarct. The left thalamus is asymmetrically low-density compared to the right common mildly expanded. This could reflect a recent infarct. No hemorrhage, hydrocephalus, or shift. Vascular: Atherosclerotic calcification. Small right ICA at the skullbase. There was right ICA occlusion on prior exam. No hyperdense vessel Skull: No acute or aggressive finding. Sinuses/Orbits: No acute finding IMPRESSION: 1. Compared to 2015 there is patchy low-density in the left thalamus with mild expansion. This may reflect a recent infarct if there is associated deficit. MRI could characterize if clinically appropriate. 2. Advanced chronic ischemic injury as noted above. 3. 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 Electronically Signed   By: Monte Fantasia M.D.   On: 01/19/2017 15:50   Mr Brain Wo Contrast  Result Date: 01/28/2017 CLINICAL DATA:  TIA were with vision change and right hand stiffness EXAM: MRI HEAD WITHOUT CONTRAST TECHNIQUE: Multiplanar, multiecho pulse sequences of the brain and surrounding structures were obtained without intravenous contrast. COMPARISON:  CT head 01/27/2017.  MRI 01/21/2017 FINDINGS: Brain: Negative for acute infarct. Moderate atrophy. Extensive chronic ischemic changes. Extensive chronic microvascular ischemia throughout the white matter bilaterally. Chronic infarct in the right midbrain. Chronic  ischemia in the pons bilaterally. Chronic infarct left thalamus and basal ganglia bilaterally. Small chronic infarct in the left cerebellum. Chronic right occipital infarct. Chronic hemorrhagic infarct right posterior frontal lobe. Negative for mass or edema or midline shift. Vascular: Loss of flow void right internal carotid artery unchanged compatible with occlusion. Otherwise normal flow voids Skull and upper cervical spine: Negative Sinuses/Orbits: Mild mucosal edema paranasal sinuses. Bilateral cataract removal Other: None IMPRESSION: Negative for acute infarct Atrophy and advanced chronic ischemic changes. Chronic occlusion right internal carotid artery. Electronically Signed   By: Franchot Gallo M.D.   On: 01/28/2017 08:49   Mr Brain Wo Contrast  Result Date: 01/21/2017 CLINICAL DATA:  66 year old female with altered mental status for 2 days. Abnormal density in the left thalamus new compared to 2015 on recent head CT. EXAM: MRI HEAD WITHOUT CONTRAST TECHNIQUE: Multiplanar, multiecho pulse sequences of the brain and surrounding structures were obtained without intravenous contrast. COMPARISON:  Head CT without contrast 01/19/2017. Brain MRI 09/17/2013. FINDINGS: Brain: The only possible focus of acutely restricted diffusion is a small area in the right caudate nucleus on series 3, image 87. Mild T2 and FLAIR hyperintensity with no hemorrhage or mass effect. Severe chronic ischemic disease. Cortical encephalomalacia in the right greater than left MCA and right PCA territories with advanced Wallerian degeneration in the right brainstem. Progressed since 2015 and confluent abnormal signal in the left deep gray matter nuclei, but mostly facilitated on diffusion indicating subacute to chronic time course. Progressed chronic small vessel ischemia in the left cerebellum since 2015. Progressed patchy and confluent left MCA territory white matter T2 and FLAIR hyperintensity. Scattered post ischemic hemosiderin  and occasional other chronic micro hemorrhages. No midline shift, mass effect, evidence of mass lesion, ventriculomegaly,  extra-axial collection or acute intracranial hemorrhage. Cervicomedullary junction and pituitary are within normal limits. 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 Other Major intracranial vascular flow voids are stable. The distal right vertebral artery is dominant. Skull and upper cervical spine: Negative. Visualized bone marrow signal is within normal limits. Sinuses/Orbits: Stable orbits soft tissues. Mild paranasal sinus mucosal thickening has not significantly changed. Other: Mild left mastoid effusion is new but significance is doubtful. Small volume retained secretions in the left nasopharynx. Right mastoids are clear. Scalp and face soft tissues appear negative. IMPRESSION: 1. Possible small acute lacunar infarct in the right caudate nucleus. No associated hemorrhage or mass effect. 2. No other acute ischemia identified, but very severe chronic ischemic disease. Progression in the left deep gray matter nuclei and the left cerebellum since 2015. Right greater than left chronic MCA and right PCA territory encephalomalacia with advanced Wallerian degeneration in the right brainstem. 3. Chronic right ICA occlusion. Electronically Signed   By: Genevie Ann M.D.   On: 01/21/2017 07:56   US Carotid Bilateral  Result Date: 01/22/2017 CLINICAL DATA:  CVA. 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 Status post left carotid endarterectomy in 2014. EXAM: BILATERAL CAROTID DUPLEX ULTRASOUND TECHNIQUE: Pearline Cables scale imaging, color Doppler and duplex ultrasound were performed of bilateral carotid and vertebral arteries in the neck. COMPARISON:  Brain MRI 01/21/2017. Carotid duplex report from 06/11/2014 FINDINGS: Criteria: Quantification of carotid stenosis is based on velocity parameters that correlate the residual internal carotid  diameter with NASCET-based stenosis levels, using the diameter of the distal internal carotid lumen as the denominator for stenosis measurement. The following velocity measurements were obtained: RIGHT ICA:  Occluded CCA:  94 cm/sec ECA:  234 cm/sec LEFT ICA:  165 cm/sec CCA:  621 cm/sec SYSTOLIC ICA/CCA RATIO:  1.2 DIASTOLIC ICA/CCA RATIO:  1.8 ECA:  176 cm/sec RIGHT CAROTID ARTERY: Heterogeneous plaque in the proximal common carotid artery. Irregular echogenic plaque at the right carotid bulb. Occlusion of the internal carotid artery near the origin. Right external carotid artery is patent. RIGHT VERTEBRAL ARTERY: Antegrade flow and normal waveform in the right vertebral artery. LEFT CAROTID ARTERY: Small amount of plaque in left common carotid artery. Left carotid bulb is patulous and compatible with previous endarterectomy. External carotid artery is patent with normal waveform. Small amount of plaque in the left internal carotid artery. Mild tortuosity in the distal internal carotid artery with a slightly elevated peak systolic velocity measuring up to 165 cm/sec. LEFT VERTEBRAL ARTERY: Retrograde flow in the left vertebral artery. This finding appears chronic based on the previous ultrasound report. IMPRESSION: Chronic occlusion of the right internal carotid artery. Surgical changes compatible with a left carotid endarterectomy. Mild plaque and tortuosity in the left internal carotid artery. Estimated degree of stenosis in the left internal carotid artery is less than 50%. Retrograde flow in the left vertebral artery. 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 This appears to be a chronic finding. Antegrade flow in the right vertebral artery. Electronically Signed   By: Markus Daft M.D.   On: 01/22/2017 08:59   US Venous Img Lower Bilateral  Result Date: 01/19/2017 CLINICAL DATA:  Bilateral lower extremity swelling. EXAM: BILATERAL LOWER EXTREMITY VENOUS  DOPPLER ULTRASOUND TECHNIQUE: Gray-scale sonography with graded compression, as well as color Doppler and duplex ultrasound were performed to evaluate the lower extremity deep venous systems from the level of the common femoral vein and  including the common femoral, femoral, profunda femoral, popliteal and calf veins including the posterior tibial, peroneal and gastrocnemius veins when visible. The superficial great saphenous vein was also interrogated. Spectral Doppler was utilized to evaluate flow at rest and with distal augmentation maneuvers in the common femoral, femoral and popliteal veins. COMPARISON:  10/13/2016 FINDINGS: RIGHT LOWER EXTREMITY Common Femoral Vein: No evidence of thrombus. Normal compressibility, respiratory phasicity and response to augmentation. Saphenofemoral Junction: No evidence of thrombus. Normal compressibility and flow on color Doppler imaging. Profunda Femoral Vein: No evidence of thrombus. Normal compressibility and flow on color Doppler imaging. Femoral Vein: No evidence of thrombus. Normal compressibility, respiratory phasicity and response to augmentation. Popliteal Vein: No evidence of thrombus. Normal compressibility, respiratory phasicity and response to augmentation. Calf Veins: No evidence of thrombus. Normal compressibility and flow on color Doppler imaging. Superficial Great Saphenous Vein: No evidence of thrombus. Normal compressibility. Venous Reflux:  None. Other Findings:  None. LEFT LOWER EXTREMITY Common Femoral Vein: No evidence of thrombus. Normal compressibility, respiratory phasicity and response to augmentation. Saphenofemoral Junction: No evidence of thrombus. Normal compressibility and flow on color Doppler imaging. Profunda Femoral Vein: No evidence of thrombus. Normal compressibility and flow on color Doppler imaging. Femoral Vein: No evidence of thrombus. Normal compressibility, respiratory phasicity and response to augmentation. Popliteal Vein: No  evidence of thrombus. Normal compressibility, respiratory phasicity and response to augmentation. Calf Veins: No evidence of thrombus. Normal compressibility and flow on color Doppler imaging. Superficial Great Saphenous Vein: No evidence of thrombus. Normal compressibility. Venous Reflux:  None. Other Findings:  None. IMPRESSION: No evidence of deep venous thrombosis. Electronically Signed   By: Rolm Baptise M.D.   On: 01/19/2017 15:35   Ir Fluoro Guide Cv Line Left  Result Date: 01/14/2017 INDICATION: End-stage renal disease, in need of durable intravenous access for continuation of dialysis. EXAM: TUNNELED CENTRAL VENOUS HEMODIALYSIS CATHETER PLACEMENT WITH ULTRASOUND AND FLUOROSCOPIC GUIDANCE MEDICATIONS: Ancef 2 gm IV . The antibiotic was given in an appropriate time interval prior to skin puncture. ANESTHESIA/SEDATION: Versed 2 mg IV; Fentanyl 50 mcg IV; Moderate Sedation Time:  16 minutes The patient was continuously monitored during the procedure by the interventional radiology nurse under my direct supervision. FLUOROSCOPY TIME:  Fluoroscopy Time: 36 seconds (5 mGy). COMPLICATIONS: None immediate. PROCEDURE: Informed written consent was obtained from the patient after a discussion of the risks, benefits, and alternatives to treatment. Questions regarding the procedure were encouraged and answered. The left neck and chest were prepped with chlorhexidine in a sterile fashion, and a sterile drape was applied covering the operative field. Maximum barrier sterile technique with sterile gowns and gloves were used for the procedure. A timeout was performed prior to the initiation of the procedure. After creating a small venotomy incision, a micropuncture kit was utilized to access the internal jugular vein. Real-time ultrasound guidance was utilized for vascular access including the acquisition of a permanent ultrasound image documenting patency of the accessed vessel. The microwire was utilized to measure  appropriate catheter length. A stiff Glidewire was advanced to the level of the IVC and the micropuncture sheath was exchanged for a peel-away sheath. A palindrome tunneled hemodialysis catheter measuring 23 cm from tip to cuff was tunneled in a retrograde fashion from the anterior chest wall to the venotomy incision. The catheter was then placed through the peel-away sheath with tips ultimately positioned within the superior aspect of the right atrium. Final catheter positioning was confirmed and documented with a spot radiographic image. The catheter  aspirates and flushes normally. The catheter was flushed with appropriate volume heparin dwells. The catheter exit site was secured with a 0-Prolene retention suture. The venotomy incision was closed with an interrupted 4-0 Vicryl, Dermabond and Steri-strips. Dressings were applied. The patient tolerated the procedure well without immediate post procedural complication. IMPRESSION: Successful placement of 23 cm tip to cuff tunneled hemodialysis catheter via the left internal jugular vein with tips terminating within the superior aspect of the right atrium. The catheter is ready for immediate use. Electronically Signed   By: Sandi Mariscal M.D.   On: 01/14/2017 15:28   Ir US Guide Vasc Access Left  Result Date: 01/14/2017 INDICATION: End-stage renal disease, in need of durable intravenous access for continuation of dialysis. EXAM: TUNNELED CENTRAL VENOUS HEMODIALYSIS CATHETER PLACEMENT WITH ULTRASOUND AND FLUOROSCOPIC GUIDANCE MEDICATIONS: Ancef 2 gm IV . The antibiotic was given in an appropriate time interval prior to skin puncture. ANESTHESIA/SEDATION: Versed 2 mg IV; Fentanyl 50 mcg IV; Moderate Sedation Time:  16 minutes The patient was continuously monitored during the procedure by the interventional radiology nurse under my direct supervision. FLUOROSCOPY TIME:  Fluoroscopy Time: 36 seconds (5 mGy). COMPLICATIONS: None immediate. PROCEDURE: Informed written  consent was obtained from the patient after a discussion of the risks, benefits, and alternatives to treatment. Questions regarding the procedure were encouraged and answered. The left neck and chest were prepped with chlorhexidine in a sterile fashion, and a sterile drape was applied covering the operative field. Maximum barrier sterile technique with sterile gowns and gloves were used for the procedure. A timeout was performed prior to the initiation of the procedure. After creating a small venotomy incision, a micropuncture kit was utilized to access the internal jugular vein. Real-time ultrasound guidance was utilized for vascular access including the acquisition of a permanent ultrasound image documenting patency of the accessed vessel. The microwire was utilized to measure appropriate catheter length. A stiff Glidewire was advanced to the level of the IVC and the micropuncture sheath was exchanged for a peel-away sheath. A palindrome tunneled hemodialysis catheter measuring 23 cm from tip to cuff was tunneled in a retrograde fashion from the anterior chest wall to the venotomy incision. The catheter was then placed through the peel-away sheath with tips ultimately positioned within the superior aspect of the right atrium. Final catheter positioning was confirmed and documented with a spot radiographic image. The catheter aspirates and flushes normally. The catheter was flushed with appropriate volume heparin dwells. The catheter exit site was secured with a 0-Prolene retention suture. The venotomy incision was closed with an interrupted 4-0 Vicryl, Dermabond and Steri-strips. Dressings were applied. The patient tolerated the procedure well without immediate post procedural complication. IMPRESSION: Successful placement of 23 cm tip to cuff tunneled hemodialysis catheter via the left internal jugular vein with tips terminating within the superior aspect of the right atrium. The catheter is ready for immediate  use. Electronically Signed   By: Sandi Mariscal M.D.   On: 01/14/2017 15:28   Dg Chest Port 1 View  Result Date: 01/22/2017 CLINICAL DATA:  66 year old female with history of shortness of breath. EXAM: PORTABLE CHEST 1 VIEW COMPARISON:  Chest x-ray 01/20/2017. FINDINGS: There is a right-sided internal jugular central venous catheter with tip terminating in the superior cavoatrial junction. Left-sided internal jugular PermCath with tip terminating in the superior cavoatrial junction. Film is underpenetrated limiting the diagnostic sensitivity and specificity of the examination. With this limitation in mind, there is potential retrocardiac opacity in the left lower  lobe which could reflect atelectasis and/or airspace consolidation. No definite pleural effusions. There is cephalization of the pulmonary vasculature and slight indistinctness of the interstitial markings suggestive of mild pulmonary edema. Mild cardiomegaly. The patient is rotated to the left on today's exam, resulting in distortion of the mediastinal contours and reduced diagnostic sensitivity and specificity for mediastinal pathology. Aortic atherosclerosis. IMPRESSION: 1. Support apparatus, as above. 2. Overall, the appearance the chest suggests mild congestive heart failure, as discussed above. 3. Possible area of atelectasis and/or consolidation in the left lower lobe. 4. Aortic atherosclerosis. Electronically Signed   By: Vinnie Langton M.D.   On: 01/22/2017 18:15   Dg Chest Port 1 View  Result Date: 01/20/2017 CLINICAL DATA:  Altered mental status short of breath EXAM: PORTABLE CHEST 1 VIEW COMPARISON:  01/09/2017 FINDINGS: Interval placement of left jugular dual-lumen catheter with tip in the SVC. Right jugular catheter tip in the SVC unchanged. No pneumothorax Interval improvement in diffuse bilateral airspace disease. Mild vascular congestion remains with left lower lobe atelectasis and small left effusion IMPRESSION: Left jugular  dual-lumen catheter in good position. Improvement in bilateral edema. Electronically Signed   By: Franchot Gallo M.D.   On: 01/20/2017 08:41   Dg Chest Port 1 View  Result Date: 01/09/2017 CLINICAL DATA:  CHF EXAM: PORTABLE CHEST 1 VIEW COMPARISON:  01/07/2017 FINDINGS: Severe bilateral airspace disease similar to the prior study. Probable congestive heart failure with edema. Pneumonia not excluded. Bibasilar atelectasis and small left effusion. Right jugular central venous catheter tip in the SVC unchanged IMPRESSION: Diffuse bilateral airspace disease unchanged. Probable heart failure. Electronically Signed   By: Franchot Gallo M.D.   On: 01/09/2017 11:27   Dg Chest Port 1 View  Result Date: 01/07/2017 CLINICAL DATA:  66 year old female central line placement. EXAM: PORTABLE CHEST 1 VIEW COMPARISON:  1016 hours today and earlier. FINDINGS: Portable AP semi upright view at 1432 hours. New right IJ approach central line is in place, tip projects at the level of the right mainstem bronchus corresponding to the lower SVC. No pneumothorax. Stable cardiac size and mediastinal contours. Stable bilateral ventilation since this morning including dense retrocardiac opacity which has progressed since 01/06/2017. Visualized tracheal air column is within normal limits. IMPRESSION: 1. Right IJ central line tip at the lower SVC level. No pneumothorax or adverse features. 2. Stable ventilation since 1016 hours today. Electronically Signed   By: Genevie Ann M.D.   On: 01/07/2017 15:02   Dg Chest Port 1 View  Result Date: 01/07/2017 CLINICAL DATA:  Respiratory distress. EXAM: PORTABLE CHEST 1 VIEW COMPARISON:  01/06/2017 FINDINGS: Lungs are adequately inflated with worsening patchy multifocal airspace opacification most notable over the left midlung and right upper lobe likely a multifocal infectious process. Possible small amount left pleural fluid. Mild stable cardiomegaly. Remainder of the exam is unchanged. IMPRESSION:  Slight worsening bilateral multifocal airspace process most notable over the left midlung and right upper lobe suggesting multifocal infection. Stable cardiomegaly. Electronically Signed   By: Marin Olp M.D.   On: 01/07/2017 10:37       Subjective: Denies any shortness of breath.  No new complaints.  Discharge Exam: Vitals:   01/30/17 0755 01/30/17 1100  BP:  (!) 158/95  Pulse:  (!) 106  Resp:  16  Temp:  98.5 F (36.9 C)  SpO2: 95% 95%   Vitals:   01/30/17 0500 01/30/17 0700 01/30/17 0755 01/30/17 1100  BP:  (!) 164/84  (!) 158/95  Pulse:  Marland Kitchen)  103  (!) 106  Resp:  16  16  Temp:  98.6 F (37 C)  98.5 F (36.9 C)  TempSrc:  Oral    SpO2:  96% 95% 95%  Weight: 79.6 kg (175 lb 7.8 oz)     Height:        General: Pt is alert, awake, not in acute distress Cardiovascular: RRR, S1/S2 +, no rubs, no gallops Respiratory: CTA bilaterally, no wheezing, no rhonchi Abdominal: Soft, NT, ND, bowel sounds + Extremities: no edema, no cyanosis    The results of significant diagnostics from this hospitalization (including imaging, microbiology, ancillary and laboratory) are listed below for reference.     Microbiology: Recent Results (from the past 240 hour(s))  MRSA PCR Screening     Status: None   Collection Time: 01/22/17  7:21 PM  Result Value Ref Range Status   MRSA by PCR NEGATIVE NEGATIVE Final    Comment:        The GeneXpert MRSA Assay (FDA approved for NASAL specimens only), is one component of a comprehensive MRSA colonization surveillance program. It is not intended to diagnose MRSA infection nor to guide or monitor treatment for MRSA infections.      Labs: BNP (last 3 results) Recent Labs    10/13/16 0352 01/06/17 1655 01/27/17 2340  BNP 383.0* 966.0* 7,673.4*   Basic Metabolic Panel: Recent Labs  Lab 01/25/17 0555 01/25/17 1533 01/26/17 0613 01/27/17 0452 01/27/17 2340 01/30/17 0607  NA 139 138 138 137 135 137  K 4.0 3.2* 3.7 4.1 4.1  3.7  CL 105 102 102 101 98* 99*  CO2 _0 GLUCOSE 142* 214* 142* 129* 204* 122*  BUN 68* 28* 50* 63* 35* 25*  CREATININE 5.00* 2.48* 3.78* 4.55* 3.15* 2.91*  CALCIUM 7.0* 7.3* 6.8* 6.7* 7.0* 7.4*  PHOS 4.6 2.6 4.2  --   --   --    Liver Function Tests: Recent Labs  Lab 01/25/17 0555 01/25/17 1533 01/26/17 0613 01/27/17 2340  AST  --   --   --  16  ALT  --   --   --  10*  ALKPHOS  --   --   --  70  BILITOT  --   --   --  0.5  PROT  --   --   --  5.8*  ALBUMIN 2.7* 2.7* 2.5* 2.9*   No results for input(s): LIPASE, AMYLASE in the last 168 hours. No results for input(s): AMMONIA in the last 168 hours. CBC: Recent Labs  Lab 01/25/17 1533 01/26/17 0613 01/27/17 0452 01/27/17 2340 01/30/17 0607  WBC 6.6 6.4 7.5 6.8 5.1  NEUTROABS  --   --   --  4.8  --   HGB 8.7* 7.9* 8.0* 8.7* 8.5*  HCT 27.4* 25.4* 25.8* 27.6* 27.2*  MCV 91.9 92.7 92.8 93.2 91.9  PLT 138* 141* 159 179 246   Cardiac Enzymes: Recent Labs  Lab 01/27/17 2340 01/28/17 1115  TROPONINI 0.04* 0.04*   BNP: Invalid input(s): POCBNP CBG: Recent Labs  Lab 01/29/17 1124 01/29/17 1646 01/29/17 2156 01/30/17 0732 01/30/17 1137  GLUCAP 212* 98 209* 115* 188*   D-Dimer No results for input(s): DDIMER in the last 72 hours. Hgb A1c No results for input(s): HGBA1C in the last 72 hours. Lipid Profile No results for input(s): CHOL, HDL, LDLCALC, TRIG, CHOLHDL, LDLDIRECT in the last 72 hours. Thyroid function studies No results for input(s): TSH, T4TOTAL, T3FREE, THYROIDAB in the  last 72 hours.  Invalid input(s): FREET3 Anemia work up No results for input(s): VITAMINB12, FOLATE, FERRITIN, TIBC, IRON, RETICCTPCT in the last 72 hours. Urinalysis    Component Value Date/Time   COLORURINE YELLOW 01/10/2017 1150   APPEARANCEUR CLOUDY (A) 01/10/2017 1150   LABSPEC 1.011 01/10/2017 1150   PHURINE 7.0 01/10/2017 1150   GLUCOSEU NEGATIVE 01/10/2017 1150   HGBUR NEGATIVE 01/10/2017 1150    BILIRUBINUR NEGATIVE 01/10/2017 1150   KETONESUR NEGATIVE 01/10/2017 1150   PROTEINUR 100 (A) 01/10/2017 1150   UROBILINOGEN 0.2 04/25/2014 2315   NITRITE NEGATIVE 01/10/2017 1150   LEUKOCYTESUR MODERATE (A) 01/10/2017 1150   Sepsis Labs Invalid input(s): PROCALCITONIN,  WBC,  LACTICIDVEN Microbiology Recent Results (from the past 240 hour(s))  MRSA PCR Screening     Status: None   Collection Time: 01/22/17  7:21 PM  Result Value Ref Range Status   MRSA by PCR NEGATIVE NEGATIVE Final    Comment:        The GeneXpert MRSA Assay (FDA approved for NASAL specimens only), is one component of a comprehensive MRSA colonization surveillance program. It is not intended to diagnose MRSA infection nor to guide or monitor treatment for MRSA infections.      Time coordinating discharge: Over 30 minutes  SIGNED:   Kathie Dike, MD  [facility] 01/30/2017, 12:50 PM Pager   If 7PM-7AM, please contact night-coverage [email or web address] Password [email or web address]

## 2017-01-30 NOTE — NC FL2 (Signed)
[place] MEDICAID FL2 LEVEL OF CARE SCREENING TOOL     IDENTIFICATION  Patient Name: Kelli [name] Birthdate: 12-11-1950 Sex: female Admission Date (Current Location): 01/27/2017  [facility] and [place] Number:  Manufacturing engineer and Address:  [facility] [address] [place]      Provider Number: [id number]  Attending Physician Name and Address:  Kathie Dike, MD  Relative Name and Phone Number:  Maizy Davanzo [phone])    Current Level of Care: Hospital Recommended Level of Care: Skilled Nursing Facility([facility]) Prior Approval Number:    Date Approved/Denied:   PASRR Number:    Discharge Plan:      Current Diagnoses: Patient Active Problem List   Diagnosis Date Noted  . Occult GI bleeding 01/28/2017  . TIA (transient ischemic attack) 01/28/2017  . Congestive heart failure ([place])   . ESRD on hemodialysis ([place])   . Pressure injury of skin 01/23/2017  . ESRD (end stage renal disease) ([place])   . Gastritis and duodenitis   . Acute metabolic encephalopathy 74/01/8785  . Palliative care encounter   . Goals of care, counseling/discussion   . DNR (do not resuscitate) discussion   . Acute renal failure superimposed on stage 4 chronic kidney disease ([place]) 01/09/2017  . Precordial pain   . CKD (chronic kidney disease), stage IV ([place]) 01/07/2017  . Acute respiratory failure with hypoxia ([place]) 01/07/2017  . Anemia 01/06/2017  . Diabetes mellitus type 2 in obese ([place]) 01/06/2017  . B12 deficiency 11/18/2016  . Anemia in chronic kidney disease 11/18/2016  . PUD (peptic ulcer disease) 10/13/2016  . Pacemaker 10/13/2016  . Acute on chronic diastolic CHF (congestive heart failure) ([place]) 10/13/2016  . Elevated troponin   . Hypovolemic shock ([place]) 04/26/2014  . Upper GI bleed   . Leukocytosis, unspecified 07/30/2013  . Thrombocytosis ([place]) 07/30/2013  . Subluxation of left shoulder joint 07/27/2013  . Closed dislocation of left  humerus 07/26/2013  . Pyelonephritis 07/25/2013  . Acute on chronic renal failure ([place]) 07/25/2013  . Normocytic anemia 07/25/2013  . Hyponatremia 07/25/2013  . Spastic hemiplegia affecting nondominant side ([place]) 07/06/2013  . 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 UTI (urinary tract infection) 06/15/2013  . Edema 05/28/2013  . Knee pain 05/28/2013  . Carotid stenosis, bilateral 05/22/2013  . Fall at nursing home 05/20/2013  . Hip pain 05/20/2013  . Pain in joint, lower leg 05/19/2013  . Chest congestion 05/12/2013  . E. coli UTI 05/07/2013  . Left shoulder pain due to subluxation and/or adhesive capsulitis.  05/07/2013  . Acute CVA (cerebrovascular accident): R PCA infarct per MRI 04/13/13 04/13/2013  . CVA (cerebral infarction) 04/08/2013  . Acute ischemic stroke ([place]) 04/08/2013  . Diplopia 04/08/2013  . Left-sided weakness 04/08/2013  . Vertigo 04/08/2013  . Paresthesias in left hand 04/08/2013  . Cerebellar stroke ([place]) 04/08/2013  . Dizziness and giddiness 04/08/2013  . Stroke ([place]) 04/08/2013  . Depression 04/06/2013  . Peripheral arterial disease ([place]) 03/16/2013  . History of stroke 01/30/2013  . Anxiety state, unspecified 12/08/2012  . Lumbago 12/08/2012  . Urinary frequency 11/14/2012  . Occlusion and stenosis of carotid artery with cerebral infarction 10/05/2012  . Mixed hyperlipidemia 10/05/2012  . Renal insufficiency 10/05/2012  . HTN (hypertension) 10/04/2012  . Tobacco abuse 10/04/2012  . Obesity 10/04/2012    Orientation RESPIRATION BLADDER Height & Weight     Self, Time, Situation  Normal Incontinent Weight: 175 lb 7.8  oz (79.6 kg) Height:  5\' 5"  (165.1 cm)  BEHAVIORAL SYMPTOMS/MOOD NEUROLOGICAL BOWEL NUTRITION STATUS  Other (Comment)(Na) (previous stroke) Continent    AMBULATORY STATUS COMMUNICATION OF NEEDS Skin   Total Care Verbally Normal                       Personal Care Assistance Level  of Assistance  Dressing, Feeding, Bathing, Total care Bathing Assistance: Maximum assistance Feeding assistance: Limited assistance Dressing Assistance: Maximum assistance Total Care Assistance: Maximum assistance   Functional Limitations Info  (NA) Sight Info: Adequate Hearing Info: Adequate Speech Info: Adequate    SPECIAL CARE FACTORS FREQUENCY  (diabetic)                    Contractures Contractures Info: Not present    Additional Factors Info  Code Status, Allergies               Current Medications (01/30/2017):  This is the current hospital active medication list Current Facility-Administered Medications  Medication Dose Route Frequency Provider Last Rate Last Dose  .  stroke: mapping our early stages of recovery book   Does not apply Once Opyd, Timothy S, MD      . 0.9 %  sodium chloride infusion  100 mL Intravenous PRN Bhutani, Manpreet S, MD      . 0.9 %  sodium chloride infusion  100 mL Intravenous PRN Bhutani, Manpreet S, MD      . 0.9 %  sodium chloride infusion  100 mL Intravenous PRN Bhutani, Manpreet S, MD      . 0.9 %  sodium chloride infusion  100 mL Intravenous PRN Bhutani, Manpreet S, MD      . acetaminophen (TYLENOL) tablet 650 mg  650 mg Oral Q4H PRN Opyd, Ilene Qua, MD       Or  . acetaminophen (TYLENOL) solution 650 mg  650 mg Per Tube Q4H PRN Opyd, Ilene Qua, MD       Or  . acetaminophen (TYLENOL) suppository 650 mg  650 mg Rectal Q4H PRN Opyd, Ilene Qua, MD      . alteplase (CATHFLO ACTIVASE) injection 2 mg  2 mg Intracatheter Once PRN Bhutani, Manpreet S, MD      . alteplase (CATHFLO ACTIVASE) injection 2 mg  2 mg Intracatheter Once PRN Bhutani, Manpreet S, MD      . atorvastatin (LIPITOR) tablet 40 mg  40 mg Oral q1800 Opyd, Ilene Qua, MD   40 mg at 01/29/17 1840  . budesonide (PULMICORT) nebulizer solution 0.5 mg  0.5 mg Nebulization BID Opyd, Ilene Qua, MD   0.5 mg at 01/30/17 0755  . calcitRIOL (ROCALTROL) capsule 0.25 mcg  0.25 mcg  Oral Daily Opyd, Ilene Qua, MD   0.25 mcg at 01/30/17 0845  . clopidogrel (PLAVIX) tablet 75 mg  75 mg Oral Daily Kathie Dike, MD   75 mg at 01/30/17 0845  . [START ON 01/31/2017] epoetin alfa (EPOGEN,PROCRIT) injection 8,000 Units  8,000 Units Subcutaneous Q M,W,F-HD Bhutani, Manpreet S, MD      . feeding supplement (BOOST / RESOURCE BREEZE) liquid 1 Container  1 Container Oral TID BM Opyd, Ilene Qua, MD   1 Container at 01/30/17 0845  . guaiFENesin-dextromethorphan (ROBITUSSIN DM) 100-10 MG/5ML syrup 5 mL  5 mL Oral Q4H PRN Opyd, Ilene Qua, MD      . heparin injection 1,000 Units  1,000 Units Dialysis PRN Liana Gerold, MD   1,000 Units at  01/28/17 1824  . heparin injection 1,000 Units  1,000 Units Dialysis PRN Liana Gerold, MD   1,000 Units at 01/29/17 1652  . heparin injection 5,000 Units  5,000 Units Subcutaneous Q8H Opyd, Ilene Qua, MD   5,000 Units at 01/30/17 0550  . insulin aspart (novoLOG) injection 0-15 Units  0-15 Units Subcutaneous TID WC Opyd, Ilene Qua, MD   3 Units at 01/30/17 1200  . insulin aspart (novoLOG) injection 0-5 Units  0-5 Units Subcutaneous QHS Vianne Bulls, MD   2 Units at 01/29/17 2202  . ipratropium-albuterol (DUONEB) 0.5-2.5 (3) MG/3ML nebulizer solution 3 mL  3 mL Nebulization Q6H PRN Opyd, Ilene Qua, MD      . labetalol (NORMODYNE,TRANDATE) injection 5-10 mg  5-10 mg Intravenous Q2H PRN Opyd, Ilene Qua, MD   5 mg at 01/28/17 0748  . lidocaine (PF) (XYLOCAINE) 1 % injection 5 mL  5 mL Intradermal PRN Bhutani, Manpreet S, MD      . lidocaine (PF) (XYLOCAINE) 1 % injection 5 mL  5 mL Intradermal PRN Bhutani, Manpreet S, MD      . lidocaine-prilocaine (EMLA) cream 1 application  1 application Topical PRN Bhutani, Manpreet S, MD      . lidocaine-prilocaine (EMLA) cream 1 application  1 application Topical PRN Bhutani, Manpreet S, MD      . loratadine (CLARITIN) tablet 10 mg  10 mg Oral Daily Opyd, Ilene Qua, MD   10 mg at 01/30/17 0844  . ondansetron  (ZOFRAN) injection 4 mg  4 mg Intravenous Q6H PRN Kathie Dike, MD   4 mg at 01/29/17 0945  . pantoprazole (PROTONIX) EC tablet 40 mg  40 mg Oral BID AC Opyd, Ilene Qua, MD   40 mg at 01/30/17 0845  . pentafluoroprop-tetrafluoroeth (GEBAUERS) aerosol 1 application  1 application Topical PRN Bhutani, Manpreet S, MD      . pentafluoroprop-tetrafluoroeth (GEBAUERS) aerosol 1 application  1 application Topical PRN Bhutani, Manpreet S, MD      . senna-docusate (Senokot-S) tablet 1 tablet  1 tablet Oral QHS PRN Opyd, Ilene Qua, MD      . traMADol (ULTRAM) tablet 50 mg  50 mg Oral Q12H PRN Opyd, Ilene Qua, MD      . venlafaxine XR (EFFEXOR-XR) 24 hr capsule 150 mg  150 mg Oral Q breakfast Opyd, Ilene Qua, MD   150 mg at 01/30/17 1035     Discharge Medications: Please see discharge summary for a list of discharge medications.  Relevant Imaging Results:  Relevant Lab Results:   Additional [place] Arch[facility] [place]er, LCSWA

## 2017-01-30 NOTE — Progress Notes (Signed)
Subjective: Interval History: Patient is feeling much better.  She denies any difficulty breathing and her appetite is good  Objective: Vital signs in last 24 hours: Temp:  [97.5 F (36.4 C)-98.8 F (37.1 C)] 98.6 F (37 C) (12/02 0700) Pulse Rate:  [76-110] 103 (12/02 0700) Resp:  [16-18] 16 (12/02 0700) BP: (135-197)/(59-91) 164/84 (12/02 0700) SpO2:  [95 %-100 %] 95 % (12/02 0755) Weight:  [79.6 kg (175 lb 7.8 oz)-82.2 kg (181 lb 3.5 oz)] 79.6 kg (175 lb 7.8 oz) (12/02 0500) Weight change: 0 kg (0 lb)  Intake/Output from previous day: 12/01 0701 - 12/02 0700 In: 240 [P.O.:240] Out: 1500  Intake/Output this shift: No intake/output data recorded.  Patient seen at the beginning of dialysis. Chest: She has some expiratory wheezing and inspiratory crackles Heart exam irevealed regular rate and rhythm no murmur Abdomen: Positive bowel sounds Extremities no edema   Lab Results: Recent Labs    01/27/17 2340 01/30/17 0607  WBC 6.8 5.1  HGB 8.7* 8.5*  HCT 27.6* 27.2*  PLT 179 246   BMET:  Recent Labs    01/27/17 2340 01/30/17 0607  NA 135 137  K 4.1 3.7  CL 98* 99*  CO2 27 28  GLUCOSE 204* 122*  BUN 35* 25*  CREATININE 3.15* 2.91*  CALCIUM 7.0* 7.4*   No results for input(s): PTH in the last 72 hours. Iron Studies: No results for input(s): IRON, TIBC, TRANSFERRIN, FERRITIN in the last 72 hours.  Studies/Results: No results found.  I have reviewed the patient's current medications.  Assessment/Plan: Problem #1 renal failure: She is status post hemodialysis yesterday.  Her potassium is normal and she is a symptomatic.  2] history of CHF: We were able to remove about 1500 cc yesterday was dialysis.  She did not have any sign of fluid overload. 3] bone and mineral disorder: Her calcium and phosphorus is in the range.  Patient is not on a binder. 4] anemia: Thought to be secondary to GI bleeding and also chronic renal failure.  Patient has received multiple blood  transfusion.  Recently her hemoglobin remains a stable and she is on Epogen. 5] hypertension: Her blood pressure is high this morning. 6] CVA: Patient with left-sided weakness.  Possibly from TIA and presently she is on clopidogrel. 7] history of atrial fibrillation: Her heart rate is controlled. Plan:1] patient does require dialysis today  2] we will check a renal panel and CBC in the morning.       LOS: 0 days   Ava Tangney S 01/30/2017,9:29 AM

## 2017-01-30 NOTE — Progress Notes (Signed)
Patient transferred from the unit via stretcher accompanied by EMS and family.

## 2017-01-30 NOTE — Progress Notes (Signed)
Patient will Discharge To: [facility] Date: 01/30/17 Family Notified: Yes Kelli [name] [phone] Transport [id number] EMS   Per MD patient ready for DC to [facility]. RN, patient, patient's family, and facility notified of DC. Assessment, Fl2/Pasrr, and Discharge Summary sent to facility. RN given number for report [phone]). DC packet on chart. Ambulance transport requested for patient.   CSW signing off.  Reed Breech LCSWA [phone]

## 2017-02-03 ENCOUNTER — Other Ambulatory Visit (HOSPITAL_COMMUNITY): Payer: Self-pay | Admitting: Nephrology

## 2017-02-03 ENCOUNTER — Other Ambulatory Visit: Payer: Self-pay | Admitting: Student

## 2017-02-03 DIAGNOSIS — Z79899 Other long term (current) drug therapy: Secondary | ICD-10-CM | POA: Diagnosis not present

## 2017-02-03 DIAGNOSIS — N186 End stage renal disease: Secondary | ICD-10-CM

## 2017-02-03 DIAGNOSIS — E119 Type 2 diabetes mellitus without complications: Secondary | ICD-10-CM | POA: Diagnosis not present

## 2017-02-04 ENCOUNTER — Other Ambulatory Visit (HOSPITAL_COMMUNITY): Payer: Self-pay | Admitting: Nephrology

## 2017-02-04 ENCOUNTER — Encounter (HOSPITAL_COMMUNITY): Payer: Self-pay | Admitting: Interventional Radiology

## 2017-02-04 ENCOUNTER — Ambulatory Visit (HOSPITAL_COMMUNITY)
Admission: RE | Admit: 2017-02-04 | Discharge: 2017-02-04 | Disposition: A | Discharge status: Home / Self Care | Payer: 59 | Source: Ambulatory Visit | Attending: Nephrology | Admitting: Nephrology

## 2017-02-04 DIAGNOSIS — G8190 Hemiplegia, unspecified affecting unspecified side: Secondary | ICD-10-CM | POA: Diagnosis not present

## 2017-02-04 DIAGNOSIS — T82868A Thrombosis of vascular prosthetic devices, implants and grafts, initial encounter: Secondary | ICD-10-CM | POA: Diagnosis not present

## 2017-02-04 DIAGNOSIS — N186 End stage renal disease: Secondary | ICD-10-CM | POA: Insufficient documentation

## 2017-02-04 DIAGNOSIS — Z4901 Encounter for fitting and adjustment of extracorporeal dialysis catheter: Secondary | ICD-10-CM | POA: Diagnosis not present

## 2017-02-04 DIAGNOSIS — N179 Acute kidney failure, unspecified: Secondary | ICD-10-CM | POA: Diagnosis not present

## 2017-02-04 DIAGNOSIS — Z452 Encounter for adjustment and management of vascular access device: Secondary | ICD-10-CM | POA: Diagnosis not present

## 2017-02-04 HISTORY — PX: IR CV LINE INJECTION: IMG2294

## 2017-02-04 MED ORDER — ACETAMINOPHEN 325 MG PO TABS
ORAL_TABLET | ORAL | Status: AC
  Filled 2017-02-04: qty 2

## 2017-02-04 MED ORDER — ALTEPLASE 100 MG IV SOLR
10.0000 mg | Freq: Once | INTRAVENOUS | Status: DC
  Filled 2017-02-04: qty 10

## 2017-02-04 MED ORDER — SODIUM CHLORIDE 0.9 % IV SOLN
5.0000 mg | Freq: Once | INTRAVENOUS | Status: AC
  Administered 2017-02-04: 5 mg via INTRAVENOUS
  Filled 2017-02-04: qty 5

## 2017-02-04 MED ORDER — ACETAMINOPHEN 325 MG PO TABS
650.0000 mg | ORAL_TABLET | Freq: Once | ORAL | Status: AC
  Administered 2017-02-04: 650 mg via ORAL

## 2017-02-04 MED ORDER — SODIUM CHLORIDE 0.9 % IV SOLN
10.0000 mg | Freq: Once | INTRAVENOUS | Status: DC
  Filled 2017-02-04: qty 10

## 2017-02-04 MED ORDER — IOPAMIDOL (ISOVUE-300) INJECTION 61%
INTRAVENOUS | Status: AC
  Administered 2017-02-04: 15 mL
  Filled 2017-02-04: qty 50

## 2017-02-04 MED ORDER — HEPARIN SODIUM (PORCINE) 1000 UNIT/ML IJ SOLN
INTRAMUSCULAR | Status: AC
  Filled 2017-02-04: qty 1

## 2017-02-04 MED ORDER — HEPARIN SODIUM (PORCINE) 1000 UNIT/ML IJ SOLN
3800.0000 [IU] | Freq: Once | INTRAMUSCULAR | Status: AC
  Administered 2017-02-04: 3800 [IU] via INTRAVENOUS

## 2017-02-09 ENCOUNTER — Other Ambulatory Visit: Payer: Self-pay

## 2017-02-09 DIAGNOSIS — N185 Chronic kidney disease, stage 5: Secondary | ICD-10-CM

## 2017-02-15 DIAGNOSIS — Z992 Dependence on renal dialysis: Secondary | ICD-10-CM | POA: Diagnosis not present

## 2017-02-18 ENCOUNTER — Other Ambulatory Visit: Payer: Self-pay

## 2017-02-18 ENCOUNTER — Emergency Department (HOSPITAL_COMMUNITY)
Admission: EM | Admit: 2017-02-18 | Discharge: 2017-02-19 | Disposition: A | Discharge status: Skilled Nursing Facility | Payer: 59 | Attending: Emergency Medicine | Admitting: Emergency Medicine

## 2017-02-18 ENCOUNTER — Encounter (HOSPITAL_COMMUNITY): Payer: Self-pay

## 2017-02-18 DIAGNOSIS — D509 Iron deficiency anemia, unspecified: Secondary | ICD-10-CM | POA: Insufficient documentation

## 2017-02-18 DIAGNOSIS — I129 Hypertensive chronic kidney disease with stage 1 through stage 4 chronic kidney disease, or unspecified chronic kidney disease: Secondary | ICD-10-CM | POA: Diagnosis not present

## 2017-02-18 DIAGNOSIS — Z79899 Other long term (current) drug therapy: Secondary | ICD-10-CM | POA: Insufficient documentation

## 2017-02-18 DIAGNOSIS — E119 Type 2 diabetes mellitus without complications: Secondary | ICD-10-CM | POA: Diagnosis not present

## 2017-02-18 DIAGNOSIS — D649 Anemia, unspecified: Secondary | ICD-10-CM | POA: Diagnosis present

## 2017-02-18 DIAGNOSIS — Z87891 Personal history of nicotine dependence: Secondary | ICD-10-CM | POA: Diagnosis not present

## 2017-02-18 DIAGNOSIS — N183 Chronic kidney disease, stage 3 (moderate): Secondary | ICD-10-CM | POA: Insufficient documentation

## 2017-02-18 DIAGNOSIS — D508 Other iron deficiency anemias: Secondary | ICD-10-CM

## 2017-02-18 LAB — PREPARE RBC (CROSSMATCH)

## 2017-02-18 MED ORDER — LORAZEPAM 2 MG/ML IJ SOLN
INTRAMUSCULAR | Status: AC
  Administered 2017-02-18: 0.5 mg via INTRAVENOUS
  Filled 2017-02-18: qty 1

## 2017-02-18 MED ORDER — SODIUM CHLORIDE 0.9 % IV SOLN
Freq: Once | INTRAVENOUS | Status: AC
  Administered 2017-02-18: 18:00:00 via INTRAVENOUS

## 2017-02-18 MED ORDER — LORAZEPAM 2 MG/ML IJ SOLN
0.5000 mg | Freq: Once | INTRAMUSCULAR | Status: AC
Start: 2017-02-18 — End: 2017-02-18
  Administered 2017-02-18: 0.5 mg via INTRAVENOUS

## 2017-02-18 NOTE — ED Notes (Signed)
Husband calles to room. Pt awake and stating she is tired of this and wants to leave "I cant stand this anymore." pt still a/o. Calming measures performed. Asked pt if ativan would help her relax and explained what med was for and pt agreed to try it.  edp aware and orders received.

## 2017-02-18 NOTE — ED Notes (Signed)
Unsuccessful IV attempt on pt.. Will get another nurse to do an ultrasound IV

## 2017-02-18 NOTE — ED Notes (Signed)
Pt had soft bm. Pt and bed cleaned

## 2017-02-18 NOTE — ED Notes (Signed)
Husband updated. Lab working on type and cross

## 2017-02-18 NOTE — Discharge Instructions (Signed)
Make sure you go to your dialysis in the morning.  follow-up with your family doctor for your anemia

## 2017-02-18 NOTE — ED Triage Notes (Signed)
Pt comes in from [facility] for low hemoglobin. Pt is Alert and oriented x4. Not complaining of any pain. Per EMS, pt.'s Hemoglobin has dropped from 9.1-6.7  Negative Hemocult stool per Nursing home

## 2017-02-18 NOTE — ED Notes (Signed)
istat did not cross over. Put in medical record basket. hgb 6.5

## 2017-02-19 DIAGNOSIS — D509 Iron deficiency anemia, unspecified: Secondary | ICD-10-CM | POA: Diagnosis not present

## 2017-02-19 LAB — TYPE AND SCREEN
ABO/RH(D): A NEG
Antibody Screen: NEGATIVE
Unit division: 0
Unit division: 0

## 2017-02-19 LAB — BPAM RBC
BLOOD PRODUCT EXPIRATION DATE: 201812262359
Blood Product Expiration Date: 201812252359
ISSUE DATE / TIME: 201812211842
ISSUE DATE / TIME: 201812212213
UNIT TYPE AND RH: 600
UNIT TYPE AND RH: 600

## 2017-02-19 MED ORDER — METOPROLOL TARTRATE 50 MG PO TABS
50.0000 mg | ORAL_TABLET | Freq: Once | ORAL | Status: AC
Start: 2017-02-19 — End: 2017-02-19
  Administered 2017-02-19: 50 mg via ORAL
  Filled 2017-02-19: qty 1

## 2017-02-19 NOTE — ED Provider Notes (Signed)
[facility] EMERGENCY DEPARTMENT Provider Note   CSN: [id number] Arrival date & time: 02/18/17  1443     History   Chief Complaint Chief Complaint  Patient presents with  . low hemoglobin    HPI Kelli [name] is a 66 y.o. female.  Patient is a dialysis patient that also has a history of GI bleed.  She has been getting hemoglobin checked twice a week and it was determined her hemoglobin was 6.7 so she was sent over here for transfusion   The history is provided by the patient. No language interpreter was used.  Illness  This is a new problem. The current episode started 2 days ago. The problem occurs constantly. The problem has not changed since onset.Pertinent negatives include no chest pain, no abdominal pain and no headaches. Nothing aggravates the symptoms.    Past Medical History:  Diagnosis Date  . Carotid artery occlusion    Occluded RICA, status post left CEA  August 2014 - Dr. Donnetta Hutching  . Cerebral infarction [facility]) Aug 2014   Bihemispheric watershed infarcts  . Cerebral infarction involving left cerebellar artery [facility]) Feb 2015  . CKD (chronic kidney disease) stage 3, GFR 30-59 ml/min (HCC)   . Closed dislocation of left humerus 07/26/2013  . DM (diabetes mellitus), type 2 ([place])   . Essential hypertension, benign   . Fibromyalgia   . Mixed hyperlipidemia   . Multiple gastric ulcers   . Urinary incontinence     Patient Active Problem List   Diagnosis Date Noted  . Occult GI bleeding 01/28/2017  . TIA (transient ischemic attack) 01/28/2017  . Congestive heart failure ([place])   . ESRD on hemodialysis ([place])   . Pressure injury of skin 01/23/2017  . ESRD (end stage renal disease) ([place])   . Gastritis and duodenitis   . Acute metabolic encephalopathy 76/16/0737  . Palliative care encounter   . Goals of care, counseling/discussion   . DNR (do not resuscitate) discussion   . Acute renal failure superimposed on stage 4 chronic kidney disease ([place]) 01/09/2017  .  Precordial pain   . CKD (chronic kidney disease), stage IV ([place]) 01/07/2017  . Acute respiratory failure with hypoxia ([place]) 01/07/2017  . Anemia 01/06/2017  . Diabetes mellitus type 2 in obese ([place]) 01/06/2017  . B12 deficiency 11/18/2016  . Anemia in chronic kidney disease 11/18/2016  . PUD (peptic ulcer disease) 10/13/2016  . Pacemaker 10/13/2016  . Acute on chronic diastolic CHF (congestive heart failure) ([place]) 10/13/2016  . Elevated troponin   . Hypovolemic shock ([place]) 04/26/2014  . Upper GI bleed   . Leukocytosis, unspecified 07/30/2013  . Thrombocytosis ([place]) 07/30/2013  . Subluxation of left shoulder joint 07/27/2013  . Closed dislocation of left humerus 07/26/2013  . Pyelonephritis 07/25/2013  . Acute on chronic renal failure ([place]) 07/25/2013  . Normocytic anemia 07/25/2013  . Hyponatremia 07/25/2013  . Spastic hemiplegia affecting nondominant side ([place]) 07/06/2013  . 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 UTI (urinary tract infection) 06/15/2013  . Edema 05/28/2013  . Knee pain 05/28/2013  . Carotid stenosis, bilateral 05/22/2013  . Fall at nursing home 05/20/2013  . Hip pain 05/20/2013  . Pain in joint, lower leg 05/19/2013  . Chest congestion 05/12/2013  . E. coli UTI 05/07/2013  . Left shoulder pain due to subluxation and/or adhesive capsulitis.  05/07/2013  . Acute CVA (cerebrovascular accident): R PCA infarct per MRI 04/13/13 04/13/2013  . CVA (cerebral infarction) 04/08/2013  .  Acute ischemic stroke ([place]) 04/08/2013  . Diplopia 04/08/2013  . Left-sided weakness 04/08/2013  . Vertigo 04/08/2013  . Paresthesias in left hand 04/08/2013  . Cerebellar stroke ([place]) 04/08/2013  . Dizziness and giddiness 04/08/2013  . Stroke ([place]) 04/08/2013  . Depression 04/06/2013  . Peripheral arterial disease ([place]) 03/16/2013  . History of stroke 01/30/2013  . Anxiety state, unspecified 12/08/2012  . Lumbago 12/08/2012  .  Urinary frequency 11/14/2012  . Occlusion and stenosis of carotid artery with cerebral infarction 10/05/2012  . Mixed hyperlipidemia 10/05/2012  . Renal insufficiency 10/05/2012  . HTN (hypertension) 10/04/2012  . Tobacco abuse 10/04/2012  . Obesity 10/04/2012    Past Surgical History:  Procedure Laterality Date  . BIOPSY  01/22/2017   Procedure: BIOPSY;  Surgeon: Danie Binder, MD;  [facility];  Service: Endoscopy;;  gastric  . COMBINED HYSTERECTOMY VAGINAL W/ MMK / A&P REPAIR  1981  . ENDARTERECTOMY Left 10/06/2012   Procedure: Carotid Endarterectomy with Finesse patch angioplasty;  Surgeon: [name], MD;  Location: [facility];  Service: Vascular;  Laterality: Left;  . ESOPHAGOGASTRODUODENOSCOPY N/A 04/26/2014   Procedure: ESOPHAGOGASTRODUODENOSCOPY (EGD);  Surgeon: Lear Ng, MD;  Location: [facility] ENDOSCOPY;  Service: Endoscopy;  Laterality: N/A;  . ESOPHAGOGASTRODUODENOSCOPY N/A 01/22/2017   Procedure: ESOPHAGOGASTRODUODENOSCOPY (EGD);  Surgeon: Danie Binder, MD;  Location[facility];  Service: Endoscopy;  Laterality: N/A;  . FLEXIBLE SIGMOIDOSCOPY N/A 01/22/2017   Procedure: FLEXIBLE SIGMOIDOSCOPY;  Surgeon: Danie Binder, MD;  [facility];  Service: Endoscopy;  Laterality: N/A;  . IR CV LINE INJECTION  02/04/2017  . IR FLUORO GUIDE CV LINE LEFT  01/14/2017  . IR US GUIDE VASC ACCESS LEFT  01/14/2017  . LOOP RECORDER IMPLANT  04/16/13   MDT LinQ implanted for cryptogenic stroke  . [name] WITHOUT CARDIOVERSION N/A 04/16/2013   Procedure: TRANSESOPHAGEAL ECHOCARDIOGRAM ([name]);  Surgeon: Josue Hector, MD;  Location: [facility] ENDOSCOPY;  Service: Cardiovascular;  Laterality: N/A;  . URETHRAL DILATION  1980's  . VAGINAL HYSTERECTOMY  1981   "partial" (10/04/2012)    OB History    No data available       Home Medications    Prior to Admission medications   Medication Sig Start Date End Date Taking? Authorizing Provider  amLODipine (NORVASC) 10 MG  tablet Take 10 mg by mouth daily.   Yes [provider]  atorvastatin (LIPITOR) 40 MG tablet Take 1 tablet (40 mg total) by mouth daily at 6 PM. 10/20/16  Yes McClung, Kimberlee Nearing, MD  budesonide (PULMICORT) 0.5 MG/2ML nebulizer solution Take 2 mLs (0.5 mg total) by nebulization 2 (two) times daily. 01/27/17  Yes Kathie Dike, MD  calcitRIOL (ROCALTROL) 0.25 MCG capsule Take 1 capsule (0.25 mcg total) by mouth daily. 01/30/17  Yes Kathie Dike, MD  clopidogrel (PLAVIX) 75 MG tablet Take 1 tablet (75 mg total) by mouth daily. 01/31/17  Yes Kathie Dike, MD  epoetin alfa (EPOGEN,PROCRIT) 79892 UNIT/ML injection Inject 1 mL (10,000 Units total) into the vein Every Tuesday,Thursday,and Saturday with dialysis. 01/29/17  Yes Kathie Dike, MD  feeding supplement (BOOST / RESOURCE BREEZE) LIQD Take 1 Container by mouth 3 (three) times daily between meals. 01/27/17  Yes Kathie Dike, MD  guaiFENesin-dextromethorphan (ROBITUSSIN DM) 100-10 MG/5ML syrup Take 5 mLs by mouth every 4 (four) hours as needed for cough. 01/27/17  Yes Kathie Dike, MD  hydrALAZINE (APRESOLINE) 50 MG tablet Take 1 tablet (50 mg total) by mouth 3 (three) times  daily. 10/20/16  Yes Cherene Altes, MD  insulin aspart (NOVOLOG) 100 UNIT/ML injection Inject 0-15 Units into the skin 3 (three) times daily with meals. 01/19/17  Yes Reyne Dumas, MD  ipratropium-albuterol (DUONEB) 0.5-2.5 (3) MG/3ML SOLN Take 3 mLs by nebulization every 6 (six) hours as needed. 01/19/17  Yes Reyne Dumas, MD  loratadine (CLARITIN) 10 MG tablet Take 1 tablet (10 mg total) by mouth daily. 01/20/17  Yes Reyne Dumas, MD  metoprolol tartrate (LOPRESSOR) 50 MG tablet Take 1 tablet (50 mg total) by mouth 2 (two) times daily. 01/27/17  Yes Kathie Dike, MD  pantoprazole (PROTONIX) 40 MG tablet Take 1 tablet (40 mg total) by mouth 2 (two) times daily before a meal. 01/27/17  Yes Memon, Jolaine Artist, MD  traMADol (ULTRAM) 50 MG tablet Take 1  tablet (50 mg total) by mouth every 12 (twelve) hours as needed for moderate pain. 01/19/17  Yes Reyne Dumas, MD  triamcinolone cream (KENALOG) 0.1 % Apply 1 application topically 3 (three) times daily.   Yes [provider]  venlafaxine XR (EFFEXOR-XR) 150 MG 24 hr capsule Take 150 mg by mouth daily with breakfast.   Yes [provider]    Family History Family History  Problem Relation Age of Onset  . Diabetes Mother   . Diabetes Father   . Hypertension Sister   . Diabetes Brother   . Seizures Son   . Kidney disease Maternal Grandmother   . Hypertension Maternal Grandmother   . Heart disease Maternal Grandfather   . Diabetes Paternal Grandmother   . Heart disease Paternal Grandfather     Social History Social History   Tobacco Use  . Smoking status: Former Smoker    Packs/day: 1.00    Years: 44.00    Pack years: 44.00    Types: Cigarettes    Last attempt to quit: 10/03/2012    Years since quitting: 4.3  . Smokeless tobacco: Never Used  Substance Use Topics  . Alcohol use: No    Alcohol/week: 0.0 oz  . Drug use: No     Allergies   Hydrochlorothiazide   Review of Systems Review of Systems  Constitutional: Negative for appetite change and fatigue.  HENT: Negative for congestion, ear discharge and sinus pressure.   Eyes: Negative for discharge.  Respiratory: Negative for cough.   Cardiovascular: Negative for chest pain.  Gastrointestinal: Negative for abdominal pain and diarrhea.  Genitourinary: Negative for frequency and hematuria.  Musculoskeletal: Negative for back pain.  Skin: Negative for rash.  Neurological: Negative for seizures and headaches.  Psychiatric/Behavioral: Negative for hallucinations.     Physical Exam Updated Vital Signs BP (!) 152/78   Pulse (!) 106   Temp 98.1 F (36.7 C) (Oral)   Resp 17   Ht 5\' 5"  (1.651 m)   Wt 79.4 kg (175 lb)   SpO2 91%   BMI 29.12 kg/m   Physical Exam  Constitutional: She is oriented  to person, place, and time. She appears well-developed.  HENT:  Head: Normocephalic.  Eyes: Conjunctivae and EOM are normal. No scleral icterus.  Neck: Neck supple. No thyromegaly present.  Cardiovascular: Normal rate and regular rhythm. Exam reveals no gallop and no friction rub.  No murmur heard. Pulmonary/Chest: No stridor. She has no wheezes. She has no rales. She exhibits no tenderness.  Abdominal: She exhibits no distension. There is no tenderness. There is no rebound.  Genitourinary:  Genitourinary Comments: Rectal heme-negative  Musculoskeletal: Normal range of motion. She exhibits no edema.  Lymphadenopathy:    She has no cervical adenopathy.  Neurological: She is oriented to person, place, and time. She exhibits normal muscle tone. Coordination normal.  Skin: No rash noted. No erythema.  Psychiatric: She has a normal mood and affect. Her behavior is normal.     ED Treatments / Results  Labs (all labs ordered are listed, but only abnormal results are displayed) Labs Reviewed  I-STAT CHEM 8, ED  PREPARE RBC (CROSSMATCH)  TYPE AND SCREEN    EKG  EKG Interpretation  Date/Time:  Friday February 18 2017 15:31:29 EST Ventricular Rate:  80 PR Interval:    QRS Duration: 84 QT Interval:  415 QTC Calculation: 479 R Axis:   -4 Text Interpretation:  Sinus rhythm Atrial premature complexes Anteroseptal infarct, old Abnormal T, consider ischemia, diffuse leads Confirmed by Milton Ferguson [phone]) on 02/19/2017 12:24:59 AM       Radiology No results found.  Procedures Procedures (including critical care time)  Medications Ordered in ED Medications  metoprolol tartrate (LOPRESSOR) tablet 50 mg (not administered)  0.9 %  sodium chloride infusion ( Intravenous New Bag/Given 02/18/17 1808)  LORazepam (ATIVAN) injection 0.5 mg (0.5 mg Intravenous Given 02/18/17 1809)     Initial Impression / Assessment and Plan / ED Course  I have reviewed the triage vital signs and the  nursing notes.  Pertinent labs & imaging results that were available during my care of the patient were reviewed by me and considered in my medical decision making (see chart for details). I spoke with nephrology and they stated the patient could get 2 units of blood here in the hospital and then go back to the nursing home and go to dialysis in the morning.  Or if the patient was admitted and stayed overnight the hospitalist could contact the nephrologist.  I spoke with the hospitalist and it was decided to transfuse the patient 2 units in the emergency department and discharge her back home to the nursing home and she should go to dialysis in the morning.      Final Clinical Impressions(s) / ED Diagnoses   Final diagnoses:  Other iron deficiency anemia    ED Discharge Orders    None       Milton Ferguson, MD 02/19/17 [phone]

## 2017-02-25 LAB — I-STAT CHEM 8, ED
BUN: 15 mg/dL (ref 6–20)
CALCIUM ION: 1.01 mmol/L — AB (ref 1.15–1.40)
CHLORIDE: 98 mmol/L — AB (ref 101–111)
CREATININE: 3.5 mg/dL — AB (ref 0.44–1.00)
GLUCOSE: 134 mg/dL — AB (ref 65–99)
HCT: 19 % — ABNORMAL LOW (ref 36.0–46.0)
Hemoglobin: 6.5 g/dL — CL (ref 12.0–15.0)
Potassium: 3.5 mmol/L (ref 3.5–5.1)
Sodium: 138 mmol/L (ref 135–145)
TCO2: 28 mmol/L (ref 22–32)

## 2017-03-04 ENCOUNTER — Ambulatory Visit (INDEPENDENT_AMBULATORY_CARE_PROVIDER_SITE_OTHER)
Admission: RE | Admit: 2017-03-04 | Discharge: 2017-03-04 | Disposition: A | Discharge status: Home / Self Care | Payer: 59 | Source: Ambulatory Visit | Attending: Vascular Surgery | Admitting: Vascular Surgery

## 2017-03-04 ENCOUNTER — Ambulatory Visit (HOSPITAL_COMMUNITY)
Admission: RE | Admit: 2017-03-04 | Discharge: 2017-03-04 | Disposition: A | Discharge status: Home / Self Care | Payer: 59 | Source: Ambulatory Visit | Attending: Vascular Surgery | Admitting: Vascular Surgery

## 2017-03-04 DIAGNOSIS — N185 Chronic kidney disease, stage 5: Secondary | ICD-10-CM | POA: Diagnosis not present

## 2017-03-07 ENCOUNTER — Encounter: Payer: Self-pay | Admitting: *Deleted

## 2017-03-07 ENCOUNTER — Ambulatory Visit (INDEPENDENT_AMBULATORY_CARE_PROVIDER_SITE_OTHER): Payer: 59 | Admitting: Surgery

## 2017-03-07 ENCOUNTER — Encounter: Payer: Self-pay | Admitting: Surgery

## 2017-03-07 VITALS — BP 142/66 | HR 72 | Temp 98.7°F | Resp 20 | Ht 65.0 in | Wt 175.0 lb

## 2017-03-07 DIAGNOSIS — N186 End stage renal disease: Secondary | ICD-10-CM | POA: Diagnosis not present

## 2017-03-07 DIAGNOSIS — Z992 Dependence on renal dialysis: Secondary | ICD-10-CM | POA: Diagnosis not present

## 2017-03-07 NOTE — Progress Notes (Signed)
[facility] and Vein Specialist of [facility]  Patient name: Kelli [name] MRN: [id number] DOB: 05/22/1950 Sex: female   REQUESTING PROVIDER:    Renal   REASON FOR CONSULT:    Renal  HISTORY OF PRESENT ILLNESS:   Kelli [name] is a 67 y.o. female, who is referred today for dialysis access.  She is right-handed, and has minimal use of her left arm from her stroke.  She has a left-sided catheter and reports a history of a left-sided PICC line.  Patient has a history of CVA.  She is status post left carotid endarterectomy with known right carotid occlusion.  She suffers from hypercholesterolemia and is on a statin.  She is a former smoker.  She suffers from type 2 diabetes which is poorly controlled.  PAST MEDICAL HISTORY    Past Medical History:  Diagnosis Date  . Carotid artery occlusion    Occluded RICA, status post left CEA  August 2014 - Dr. Donnetta Hutching  . Cerebral infarction [facility]) Aug 2014   Bihemispheric watershed infarcts  . Cerebral infarction involving left cerebellar artery [facility]) Feb 2015  . CKD (chronic kidney disease) stage 3, GFR 30-59 ml/min (HCC)   . Closed dislocation of left humerus 07/26/2013  . DM (diabetes mellitus), type 2 ([place])   . Essential hypertension, benign   . Fibromyalgia   . Mixed hyperlipidemia   . Multiple gastric ulcers   . Urinary incontinence      FAMILY HISTORY   Family History  Problem Relation Age of Onset  . Diabetes Mother   . Diabetes Father   . Hypertension Sister   . Diabetes Brother   . Seizures Son   . Kidney disease Maternal Grandmother   . Hypertension Maternal Grandmother   . Heart disease Maternal Grandfather   . Diabetes Paternal Grandmother   . Heart disease Paternal Grandfather     SOCIAL HISTORY:   Social History   Socioeconomic History  . Marital status: Married    Spouse name: Not on file  . Number of children: Not on file  . Years of education: Not on file  . Highest  education level: Not on file  Social Needs  . Financial resource strain: Not on file  . Food insecurity - worry: Not on file  . Food insecurity - inability: Not on file  . Transportation needs - medical: Not on file  . Transportation needs - non-medical: Not on file  Occupational History  . Not on file  Tobacco Use  . Smoking status: Former Smoker    Packs/day: 1.00    Years: 44.00    Pack years: 44.00    Types: Cigarettes    Last attempt to quit: 10/03/2012    Years since quitting: 4.4  . Smokeless tobacco: Never Used  Substance and Sexual Activity  . Alcohol use: No    Alcohol/week: 0.0 oz  . Drug use: No  . Sexual activity: Yes    Birth control/protection: Surgical  Other Topics Concern  . Not on file  Social History Narrative   INITIALLY WORKED [place]. 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    ALLERGIES:    Allergies  Allergen Reactions  . Hydrochlorothiazide Nausea And Vomiting    CURRENT MEDICATIONS:    Current Outpatient Medications  Medication Sig Dispense Refill  . amLODipine (NORVASC) 10 MG tablet Take 10 mg by mouth daily.    Marland Kitchen atorvastatin (LIPITOR) 40 MG tablet Take 1 tablet (40  mg total) by mouth daily at 6 PM. 30 tablet 0  . budesonide (PULMICORT) 0.5 MG/2ML nebulizer solution Take 2 mLs (0.5 mg total) by nebulization 2 (two) times daily.  12  . calcitRIOL (ROCALTROL) 0.25 MCG capsule Take 1 capsule (0.25 mcg total) by mouth daily. 30 capsule 0  . clopidogrel (PLAVIX) 75 MG tablet Take 1 tablet (75 mg total) by mouth daily.    Marland Kitchen epoetin alfa (EPOGEN,PROCRIT) 47654 UNIT/ML injection Inject 1 mL (10,000 Units total) into the vein Every Tuesday,Thursday,and Saturday with dialysis. 1 mL   . feeding supplement (BOOST / RESOURCE BREEZE) LIQD Take 1 Container by mouth 3 (three) times daily between meals.  0  . guaiFENesin-dextromethorphan (ROBITUSSIN DM) 100-10 MG/5ML syrup Take 5 mLs by mouth every 4 (four) hours as  needed for cough. 118 mL 0  . hydrALAZINE (APRESOLINE) 50 MG tablet Take 1 tablet (50 mg total) by mouth 3 (three) times daily. 90 tablet 0  . insulin aspart (NOVOLOG) 100 UNIT/ML injection Inject 0-15 Units into the skin 3 (three) times daily with meals. 10 mL 11  . ipratropium-albuterol (DUONEB) 0.5-2.5 (3) MG/3ML SOLN Take 3 mLs by nebulization every 6 (six) hours as needed. 360 mL 1  . loratadine (CLARITIN) 10 MG tablet Take 1 tablet (10 mg total) by mouth daily. 30 tablet 1  . metoprolol tartrate (LOPRESSOR) 50 MG tablet Take 1 tablet (50 mg total) by mouth 2 (two) times daily.    . pantoprazole (PROTONIX) 40 MG tablet Take 1 tablet (40 mg total) by mouth 2 (two) times daily before a meal.    . traMADol (ULTRAM) 50 MG tablet Take 1 tablet (50 mg total) by mouth every 12 (twelve) hours as needed for moderate pain. 30 tablet 0  . triamcinolone cream (KENALOG) 0.1 % Apply 1 application topically 3 (three) times daily.    Marland Kitchen venlafaxine XR (EFFEXOR-XR) 150 MG 24 hr capsule Take 150 mg by mouth daily with breakfast.     No current facility-administered medications for this visit.     REVIEW OF SYSTEMS:   [X]  denotes positive finding, [ ]  denotes negative finding Cardiac  Comments:  Chest pain or chest pressure:    Shortness of breath upon exertion:    Short of breath when lying flat:    Irregular heart rhythm:        Vascular    Pain in calf, thigh, or hip brought on by ambulation:    Pain in feet at night that wakes you up from your sleep:     Blood clot in your veins:    Leg swelling:         Pulmonary    Oxygen at home:    Productive cough:     Wheezing:         Neurologic    Sudden weakness in arms or legs:  xx   Sudden numbness in arms or legs:  xx   Sudden onset of difficulty speaking or slurred speech:    Temporary loss of vision in one eye:     Problems with dizziness:         Gastrointestinal    Blood in stool:      Vomited blood:         Genitourinary      Burning when urinating:     Blood in urine:        Psychiatric    Major depression:         Hematologic  Bleeding problems:    Problems with blood clotting too easily:        Skin    Rashes or ulcers:        Constitutional    Fever or chills:     PHYSICAL EXAM:   Vitals:   03/07/17 1419  BP: (!) 142/66  Pulse: 72  Resp: 20  Temp: 98.7 F (37.1 C)  TempSrc: Oral  SpO2: 96%  Weight: 175 lb (79.4 kg)  Height: 5\' 5"  (1.651 m)    GENERAL: The patient is a well-nourished female, in no acute distress. The vital signs are documented above. CARDIAC: There is a regular rate and rhythm.  VASCULAR: Palpable left radial pulse PULMONARY: Nonlabored respirations ABDOMEN: Soft and non-tender with normal pitched bowel sounds.  MUSCULOSKELETAL: There are no major deformities or cyanosis. NEUROLOGIC: Left-sided hemiparesis SKIN: There are no ulcers or rashes noted. PSYCHIATRIC: The patient has a normal affect.  STUDIES:   Arterial study showed  positive  Allen test. Vein mapping shows a nonvisualized cephalic vein and a marginal basilic vein.  ASSESSMENT and PLAN   End-stage renal disease: I discussed proceeding with a first stage left basilic vein fistula creation.  The risks and benefits of the operation were discussed with the patient and family.  We discussed the risk of steal.  We discussed the possibility of future interventions.  I also discussed that if I did not feel the vein was of adequate caliber that a graft would be placed.  I will stop her Plavix 5 days prior to her operation.  Her operation is been scheduled for Thursday, January 17   Annamarie Major, MD [facility] and [facility] Specialists of [facility] [phone] Pager [phone]

## 2017-03-09 ENCOUNTER — Other Ambulatory Visit: Payer: Self-pay | Admitting: *Deleted

## 2017-03-15 ENCOUNTER — Encounter: Payer: Self-pay | Admitting: *Deleted

## 2017-03-15 ENCOUNTER — Other Ambulatory Visit: Payer: Self-pay | Admitting: *Deleted

## 2017-03-15 ENCOUNTER — Encounter: Payer: Self-pay | Admitting: Surgery

## 2017-03-15 NOTE — Progress Notes (Signed)
Spoke with Darius Bump at Dr. Mora Appl office "OK to hold Plavix x 5 days pre-op will continue to try and get fax to this office. Will be on Rx pad  and scan to chart.

## 2017-03-22 ENCOUNTER — Telehealth: Payer: Self-pay | Admitting: *Deleted

## 2017-03-22 NOTE — Telephone Encounter (Signed)
Surgery resched. at husbands request but dialysis days were changed T<TH<SAT via [facility], since last office appointment here. Rescheduled for 04/15/17 to be at [facility] department at 11 am. NPO past MN night prior and to Hold Plavix x 5 days pre-op. To follow the detailed instruction received from the hospital pre-admission testing department about this surgery. Verbalized understanding.

## 2017-03-29 DIAGNOSIS — Z79899 Other long term (current) drug therapy: Secondary | ICD-10-CM | POA: Diagnosis not present

## 2017-04-14 ENCOUNTER — Other Ambulatory Visit: Payer: Self-pay

## 2017-04-14 ENCOUNTER — Encounter (HOSPITAL_COMMUNITY): Payer: Self-pay | Admitting: *Deleted

## 2017-04-14 NOTE — Progress Notes (Signed)
Spoke with pt for pre-op call. Pt denies cardiac history, chest pain or sob. Pt is a type 2 diabetic. Last A1c was 5.4 in November, 2018. Pt states her fasting blood sugar is usually around 200. Pt is not on diabetic medication. Instructed pt to check her blood sugar in the AM when she gets up and every 2 hours until she leaves for the hospital. If blood sugar is 70 or below, treat with 1/2 cup of clear juice (apple or cranberry) and recheck blood sugar 15 minutes after drinking juice. If blood sugar continues to be 70 or below, call the Short Stay department and ask to speak to a nurse. She voiced understanding. Pt was home alone and had no way of writing her medicines down that she needs to take in the AM. She asked me to call back when her husband got home. I did and we went over the medications she's on and what she can take in the AM.

## 2017-04-15 ENCOUNTER — Encounter (HOSPITAL_COMMUNITY): Payer: Self-pay | Admitting: Certified Registered Nurse Anesthetist

## 2017-04-15 ENCOUNTER — Ambulatory Visit (HOSPITAL_COMMUNITY): Payer: 59 | Admitting: Certified Registered Nurse Anesthetist

## 2017-04-15 ENCOUNTER — Ambulatory Visit (HOSPITAL_COMMUNITY)
Admission: RE | Admit: 2017-04-15 | Discharge: 2017-04-15 | Disposition: A | Discharge status: Home / Self Care | Payer: 59 | Source: Ambulatory Visit | Attending: Surgery | Admitting: Surgery

## 2017-04-15 ENCOUNTER — Encounter (HOSPITAL_COMMUNITY)
Admission: RE | Disposition: A | Discharge status: Home / Self Care | Payer: Self-pay | Source: Ambulatory Visit | Attending: Surgery

## 2017-04-15 DIAGNOSIS — I132 Hypertensive heart and chronic kidney disease with heart failure and with stage 5 chronic kidney disease, or end stage renal disease: Secondary | ICD-10-CM | POA: Diagnosis present

## 2017-04-15 DIAGNOSIS — Z8711 Personal history of peptic ulcer disease: Secondary | ICD-10-CM | POA: Insufficient documentation

## 2017-04-15 DIAGNOSIS — E1151 Type 2 diabetes mellitus with diabetic peripheral angiopathy without gangrene: Secondary | ICD-10-CM | POA: Diagnosis not present

## 2017-04-15 DIAGNOSIS — Z833 Family history of diabetes mellitus: Secondary | ICD-10-CM | POA: Diagnosis not present

## 2017-04-15 DIAGNOSIS — Z841 Family history of disorders of kidney and ureter: Secondary | ICD-10-CM | POA: Diagnosis not present

## 2017-04-15 DIAGNOSIS — Z888 Allergy status to other drugs, medicaments and biological substances status: Secondary | ICD-10-CM | POA: Diagnosis not present

## 2017-04-15 DIAGNOSIS — N186 End stage renal disease: Secondary | ICD-10-CM | POA: Diagnosis not present

## 2017-04-15 DIAGNOSIS — Z820 Family history of epilepsy and other diseases of the nervous system: Secondary | ICD-10-CM | POA: Diagnosis not present

## 2017-04-15 DIAGNOSIS — E782 Mixed hyperlipidemia: Secondary | ICD-10-CM | POA: Diagnosis not present

## 2017-04-15 DIAGNOSIS — Z8673 Personal history of transient ischemic attack (TIA), and cerebral infarction without residual deficits: Secondary | ICD-10-CM | POA: Diagnosis not present

## 2017-04-15 DIAGNOSIS — N185 Chronic kidney disease, stage 5: Secondary | ICD-10-CM | POA: Diagnosis not present

## 2017-04-15 DIAGNOSIS — E1122 Type 2 diabetes mellitus with diabetic chronic kidney disease: Secondary | ICD-10-CM | POA: Diagnosis not present

## 2017-04-15 DIAGNOSIS — Z8249 Family history of ischemic heart disease and other diseases of the circulatory system: Secondary | ICD-10-CM | POA: Insufficient documentation

## 2017-04-15 DIAGNOSIS — Z87891 Personal history of nicotine dependence: Secondary | ICD-10-CM | POA: Diagnosis not present

## 2017-04-15 DIAGNOSIS — Z992 Dependence on renal dialysis: Secondary | ICD-10-CM | POA: Insufficient documentation

## 2017-04-15 DIAGNOSIS — Z794 Long term (current) use of insulin: Secondary | ICD-10-CM | POA: Insufficient documentation

## 2017-04-15 DIAGNOSIS — Z79899 Other long term (current) drug therapy: Secondary | ICD-10-CM | POA: Insufficient documentation

## 2017-04-15 DIAGNOSIS — Z7902 Long term (current) use of antithrombotics/antiplatelets: Secondary | ICD-10-CM | POA: Insufficient documentation

## 2017-04-15 DIAGNOSIS — Z950 Presence of cardiac pacemaker: Secondary | ICD-10-CM | POA: Diagnosis not present

## 2017-04-15 DIAGNOSIS — M797 Fibromyalgia: Secondary | ICD-10-CM | POA: Diagnosis not present

## 2017-04-15 HISTORY — PX: AV FISTULA PLACEMENT: SHX1204

## 2017-04-15 HISTORY — DX: Anxiety disorder, unspecified: F41.9

## 2017-04-15 HISTORY — DX: Heart failure, unspecified: I50.9

## 2017-04-15 HISTORY — DX: Anemia, unspecified: D64.9

## 2017-04-15 HISTORY — DX: Pneumonia, unspecified organism: J18.9

## 2017-04-15 HISTORY — DX: Depression, unspecified: F32.A

## 2017-04-15 HISTORY — DX: Major depressive disorder, single episode, unspecified: F32.9

## 2017-04-15 LAB — POCT I-STAT 4, (NA,K, GLUC, HGB,HCT)
Glucose, Bld: 139 mg/dL — ABNORMAL HIGH (ref 65–99)
HCT: 30 % — ABNORMAL LOW (ref 36.0–46.0)
HEMOGLOBIN: 10.2 g/dL — AB (ref 12.0–15.0)
POTASSIUM: 4.2 mmol/L (ref 3.5–5.1)
SODIUM: 136 mmol/L (ref 135–145)

## 2017-04-15 LAB — GLUCOSE, CAPILLARY: Glucose-Capillary: 136 mg/dL — ABNORMAL HIGH (ref 65–99)

## 2017-04-15 SURGERY — ARTERIOVENOUS (AV) FISTULA CREATION
Anesthesia: Monitor Anesthesia Care | Site: Arm Upper | Laterality: Left

## 2017-04-15 MED ORDER — PROPOFOL 10 MG/ML IV BOLUS
INTRAVENOUS | Status: AC
  Filled 2017-04-15: qty 20

## 2017-04-15 MED ORDER — MIDAZOLAM HCL 5 MG/5ML IJ SOLN
INTRAMUSCULAR | Status: DC | PRN
  Administered 2017-04-15 (×2): 1 mg via INTRAVENOUS

## 2017-04-15 MED ORDER — OXYCODONE HCL 5 MG/5ML PO SOLN: 5.0000 mg | Freq: Once | ORAL | Status: DC | PRN

## 2017-04-15 MED ORDER — ONDANSETRON HCL 4 MG/2ML IJ SOLN: 4.0000 mg | Freq: Once | INTRAMUSCULAR | Status: DC | PRN

## 2017-04-15 MED ORDER — HEPARIN SODIUM (PORCINE) 1000 UNIT/ML IJ SOLN
INTRAMUSCULAR | Status: AC
  Filled 2017-04-15: qty 1

## 2017-04-15 MED ORDER — PROPOFOL 500 MG/50ML IV EMUL
INTRAVENOUS | Status: DC | PRN
  Administered 2017-04-15: 100 ug/kg/min via INTRAVENOUS

## 2017-04-15 MED ORDER — ONDANSETRON HCL 4 MG/2ML IJ SOLN
INTRAMUSCULAR | Status: AC
  Filled 2017-04-15: qty 2

## 2017-04-15 MED ORDER — ONDANSETRON HCL 4 MG/2ML IJ SOLN
INTRAMUSCULAR | Status: DC | PRN
  Administered 2017-04-15: 4 mg via INTRAVENOUS

## 2017-04-15 MED ORDER — HEPARIN SODIUM (PORCINE) 5000 UNIT/ML IJ SOLN
INTRAMUSCULAR | Status: DC | PRN
  Administered 2017-04-15: 14:00:00

## 2017-04-15 MED ORDER — SODIUM CHLORIDE 0.9 % IV SOLN
1.5000 g | INTRAVENOUS | Status: AC
  Administered 2017-04-15: 1.5 g via INTRAVENOUS
  Filled 2017-04-15: qty 1.5

## 2017-04-15 MED ORDER — PHENYLEPHRINE HCL 10 MG/ML IJ SOLN: INTRAMUSCULAR | Status: DC | PRN

## 2017-04-15 MED ORDER — FENTANYL CITRATE (PF) 100 MCG/2ML IJ SOLN: 25.0000 ug | INTRAMUSCULAR | Status: DC | PRN

## 2017-04-15 MED ORDER — MIDAZOLAM HCL 2 MG/2ML IJ SOLN
INTRAMUSCULAR | Status: AC
  Filled 2017-04-15: qty 2

## 2017-04-15 MED ORDER — PHENYLEPHRINE 40 MCG/ML (10ML) SYRINGE FOR IV PUSH (FOR BLOOD PRESSURE SUPPORT)
PREFILLED_SYRINGE | INTRAVENOUS | Status: DC | PRN
  Administered 2017-04-15: 200 ug via INTRAVENOUS
  Administered 2017-04-15 (×2): 80 ug via INTRAVENOUS

## 2017-04-15 MED ORDER — LIDOCAINE-EPINEPHRINE (PF) 1 %-1:200000 IJ SOLN
INTRAMUSCULAR | Status: DC | PRN
  Administered 2017-04-15: 30 mL

## 2017-04-15 MED ORDER — HYDROCODONE-ACETAMINOPHEN 5-325 MG PO TABS: 1.0000 | ORAL_TABLET | Freq: Four times a day (QID) | ORAL | 0 refills | Status: DC | PRN

## 2017-04-15 MED ORDER — PHENYLEPHRINE 40 MCG/ML (10ML) SYRINGE FOR IV PUSH (FOR BLOOD PRESSURE SUPPORT)
PREFILLED_SYRINGE | INTRAVENOUS | Status: AC
  Filled 2017-04-15: qty 30

## 2017-04-15 MED ORDER — LIDOCAINE-EPINEPHRINE (PF) 1 %-1:200000 IJ SOLN
INTRAMUSCULAR | Status: AC
  Filled 2017-04-15: qty 30

## 2017-04-15 MED ORDER — CHLORHEXIDINE GLUCONATE 4 % EX LIQD: 60.0000 mL | Freq: Once | CUTANEOUS | Status: DC

## 2017-04-15 MED ORDER — LIDOCAINE 2% (20 MG/ML) 5 ML SYRINGE
INTRAMUSCULAR | Status: AC
  Filled 2017-04-15: qty 5

## 2017-04-15 MED ORDER — HEPARIN SODIUM (PORCINE) 1000 UNIT/ML IJ SOLN
INTRAMUSCULAR | Status: DC | PRN
  Administered 2017-04-15: 3000 [IU] via INTRAVENOUS

## 2017-04-15 MED ORDER — PROTAMINE SULFATE 10 MG/ML IV SOLN
INTRAVENOUS | Status: DC | PRN
  Administered 2017-04-15 (×5): 5 mg via INTRAVENOUS

## 2017-04-15 MED ORDER — OXYCODONE HCL 5 MG PO TABS: 5.0000 mg | ORAL_TABLET | Freq: Once | ORAL | Status: DC | PRN

## 2017-04-15 MED ORDER — FENTANYL CITRATE (PF) 100 MCG/2ML IJ SOLN
INTRAMUSCULAR | Status: DC | PRN
  Administered 2017-04-15: 50 ug via INTRAVENOUS

## 2017-04-15 MED ORDER — SODIUM CHLORIDE 0.9 % IV SOLN
INTRAVENOUS | Status: DC | PRN
  Administered 2017-04-15: 40 ug/min via INTRAVENOUS

## 2017-04-15 MED ORDER — HEMOSTATIC AGENTS (NO CHARGE) OPTIME
TOPICAL | Status: DC | PRN
  Administered 2017-04-15: 1 via TOPICAL

## 2017-04-15 MED ORDER — 0.9 % SODIUM CHLORIDE (POUR BTL) OPTIME
TOPICAL | Status: DC | PRN
  Administered 2017-04-15: 1000 mL

## 2017-04-15 MED ORDER — SODIUM CHLORIDE 0.9 % IV SOLN
INTRAVENOUS | Status: DC
  Administered 2017-04-15 (×2): via INTRAVENOUS

## 2017-04-15 MED ORDER — FENTANYL CITRATE (PF) 250 MCG/5ML IJ SOLN
INTRAMUSCULAR | Status: AC
  Filled 2017-04-15: qty 5

## 2017-04-15 SURGICAL SUPPLY — 31 items
ADH SKN CLS APL DERMABOND .7 (GAUZE/BANDAGES/DRESSINGS) ×1
ARMBAND PINK RESTRICT EXTREMIT (MISCELLANEOUS) ×6 IMPLANT
CANISTER SUCT 3000ML PPV (MISCELLANEOUS) ×3 IMPLANT
CLIP VESOCCLUDE MED 6/CT (CLIP) ×3 IMPLANT
CLIP VESOCCLUDE SM WIDE 6/CT (CLIP) ×3 IMPLANT
COVER PROBE W GEL 5X96 (DRAPES) ×3 IMPLANT
DERMABOND ADVANCED (GAUZE/BANDAGES/DRESSINGS) ×2
DERMABOND ADVANCED .7 DNX12 (GAUZE/BANDAGES/DRESSINGS) ×1 IMPLANT
ELECT REM PT RETURN 9FT ADLT (ELECTROSURGICAL) ×3
ELECTRODE REM PT RTRN 9FT ADLT (ELECTROSURGICAL) ×1 IMPLANT
GLOVE BIOGEL PI IND STRL 7.5 (GLOVE) ×1 IMPLANT
GLOVE BIOGEL PI INDICATOR 7.5 (GLOVE) ×2
GLOVE SURG SS PI 7.5 STRL IVOR (GLOVE) ×3 IMPLANT
GOWN STRL REUS W/ TWL LRG LVL3 (GOWN DISPOSABLE) ×2 IMPLANT
GOWN STRL REUS W/ TWL XL LVL3 (GOWN DISPOSABLE) ×1 IMPLANT
GOWN STRL REUS W/TWL LRG LVL3 (GOWN DISPOSABLE) ×6
GOWN STRL REUS W/TWL XL LVL3 (GOWN DISPOSABLE) ×3
HEMOSTAT SNOW SURGICEL 2X4 (HEMOSTASIS) IMPLANT
KIT BASIN OR (CUSTOM PROCEDURE TRAY) ×3 IMPLANT
KIT ROOM TURNOVER OR (KITS) ×3 IMPLANT
NS IRRIG 1000ML POUR BTL (IV SOLUTION) ×3 IMPLANT
PACK CV ACCESS (CUSTOM PROCEDURE TRAY) ×3 IMPLANT
PAD ARMBOARD 7.5X6 YLW CONV (MISCELLANEOUS) ×6 IMPLANT
SUT PROLENE 6 0 BV (SUTURE) ×2 IMPLANT
SUT PROLENE 6 0 CC (SUTURE) ×3 IMPLANT
SUT VIC AB 3-0 SH 27 (SUTURE) ×3
SUT VIC AB 3-0 SH 27X BRD (SUTURE) ×1 IMPLANT
SUT VICRYL 4-0 PS2 18IN ABS (SUTURE) ×3 IMPLANT
TOWEL GREEN STERILE (TOWEL DISPOSABLE) ×3 IMPLANT
UNDERPAD 30X30 (UNDERPADS AND DIAPERS) ×3 IMPLANT
WATER STERILE IRR 1000ML POUR (IV SOLUTION) ×3 IMPLANT

## 2017-04-15 NOTE — Anesthesia Procedure Notes (Signed)
Procedure Name: MAC Date/Time: 04/15/2017 1:15 PM Performed by: Harden Mo, CRNA Pre-anesthesia Checklist: Emergency Drugs available, Suction available, Patient identified and Patient being monitored Patient Re-evaluated:Patient Re-evaluated prior to induction Oxygen Delivery Method: Simple face mask Induction Type: IV induction Placement Confirmation: positive ETCO2 and breath sounds checked- equal and bilateral Dental Injury: Teeth and Oropharynx as per pre-operative assessment

## 2017-04-15 NOTE — Anesthesia Preprocedure Evaluation (Addendum)
Anesthesia Evaluation  Patient identified by MRN, date of birth, ID band Patient awake    Reviewed: Allergy & Precautions, NPO status , [name] Chart, lab work & pertinent test results, reviewed documented beta blocker date and time   Airway Mallampati: II  TM Distance: >3 FB Neck ROM: Full    Dental  (+) Dental Advisory Given   Pulmonary former smoker,    Pulmonary exam normal breath sounds clear to auscultation- rhonchi + decreased breath sounds      Cardiovascular hypertension, Pt. on medications and Pt. 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   Neuro/Psych Anxiety Depression TIACVA, Residual Symptoms    GI/Hepatic Neg liver ROS, PUD,   Endo/Other  diabetes  Renal/GU ESRF and DialysisRenal disease     Musculoskeletal  (+) Fibromyalgia -  Abdominal (+) - obese,   Peds  Hematology  (+) anemia ,   Anesthesia Other Findings   Reproductive/Obstetrics                          Anesthesia Physical  Anesthesia Plan  ASA: IV  Anesthesia Plan: MAC   Post-op Pain Management:    Induction:   PONV Risk Score and Plan: Propofol infusion and Treatment may vary due to age or medical condition  Airway Management Planned: Natural Airway and Simple Face Mask  Additional Equipment: None  Intra-op Plan:   Post-operative Plan:   Informed Consent: I have reviewed the patients History and Physical, chart, labs and discussed the procedure including the risks, benefits and alternatives for the proposed anesthesia with the patient or authorized representative who has indicated his/her understanding and acceptance.     Plan Discussed with: CRNA  Anesthesia Plan Comments:        Anesthesia Quick Evaluation

## 2017-04-15 NOTE — Discharge Instructions (Signed)
° °  [facility] [place] ° °Discharge Instructions ° °AV Fistula or Graft Surgery for Dialysis Access ° °Please refer to the following instructions for your post-procedure care. Your surgeon or physician assistant will discuss any changes with you. ° °Activity ° °You may drive the day following your surgery, if you are comfortable and no longer taking prescription pain medication. Resume full activity as the soreness in your incision resolves. ° °Bathing/Showering ° °You may shower after you go home. Keep your incision dry for 48 hours. Do not soak in a bathtub, hot tub, or swim until the incision heals completely. You may not shower if you have a hemodialysis catheter. ° °Incision Care ° °Clean your incision with mild soap and water after 48 hours. Pat the area dry with a clean towel. You do not need a bandage unless otherwise instructed. Do not apply any ointments or creams to your incision. You may have skin glue on your incision. Do not peel it off. It will come off on its own in about one week. Your arm may swell a bit after surgery. To reduce swelling use pillows to elevate your arm so it is above your heart. Your doctor will tell you if you need to lightly wrap your arm with an ACE bandage. ° °Diet ° °Resume your normal diet. There are not special food restrictions following this procedure. In order to heal from your surgery, it is CRITICAL to get adequate nutrition. Your body requires vitamins, minerals, and protein. Vegetables are the best source of vitamins and minerals. Vegetables also provide the perfect balance of protein. Processed food has little nutritional value, so try to avoid this. ° °Medications ° °Resume taking all of your medications. If your incision is causing pain, you may take over-the counter pain relievers such as acetaminophen (Tylenol). If you were prescribed a stronger pain medication, please be aware these medications can cause nausea and constipation. Prevent  nausea by taking the medication with a snack or meal. Avoid constipation by drinking plenty of fluids and eating foods with high amount of fiber, such as fruits, vegetables, and grains. Do not take Tylenol if you are taking prescription pain medications. ° ° ° ° °Follow up °Your surgeon may want to see you in the office following your access surgery. If so, this will be arranged at the time of your surgery. ° °Please call us immediately for any of the following conditions: ° °Increased pain, redness, drainage (pus) from your incision site °Fever of 101 degrees or higher °Severe or worsening pain at your incision site °Hand pain or numbness. ° °Reduce your risk of vascular disease: ° °Stop smoking. If you would like help, call [facility] at 1-800-QUIT-NOW (1-800-784-8669) or [place] at [phone] ° °Manage your cholesterol °Maintain a desired weight °Control your diabetes °Keep your blood pressure down ° °Dialysis ° °It will take several weeks to several months for your new dialysis access to be ready for use. Your surgeon will determine when it is OK to use it. Your nephrologist will continue to direct your dialysis. You can continue to use your Permcath until your new access is ready for use. ° °If you have any questions, please call the office at [phone]. ° °

## 2017-04-15 NOTE — Op Note (Signed)
    Patient name: Kelli [name] MRN: [id number] DOB: 06/07/1950 Sex: female  04/15/2017 Pre-operative Diagnosis: ESRD Post-operative diagnosis:  Same Surgeon:  Annamarie Major Assistants:  Arlee Muslim Procedure:   First stage left basilic vein fistula creation Anesthesia: MAC Blood Loss: Minimal Specimens: None  Findings: The basilic vein was marginal.  I elected to proceed with basilic vein fistula creation as I felt the vein looked better on ultrasound that it did on exposure.  She had a nice thrill to her fistula which did not dilate.  There are also Doppler signals at the wrist which were adequate  Indications: The patient comes in today for fistula creation.  She has a marginal basilic vein.  I discussed with the patient that she may require a graft at the vein is not adequate however I would try to utilize the vein if possible.  Procedure:  The patient was identified in the holding area and taken to [place] [address]  The patient was then placed supine on the table. MAC anesthesia was administered.  The patient was prepped and draped in the usual sterile fashion.  A time out was called and antibiotics were administered.  Ultrasound was used to evaluate the basilic vein.  It was adequate in the upper arm however about 4 cm proximal to the antecubital crease it branched.  Both branches were small but approximately 2.5 mm.  1% lidocaine was used for local anesthesia.  I made a oblique incision at the antecubital crease.  I dissected out 1 of the branches to the basilic vein which I felt on ultrasound was a larger branch.  It was rather small about 2 mm.  I then dissected out the brachial artery which was a 3 mm artery and disease free.  At this point, I made the decision to proceed with an attempt at a basilic vein fistula.  3000 units of heparin was administered.  The vein was marked for orientation and ligated distally.  I then occluded the brachial artery with vascular clamps.  Because the patient had  a positive Allen test on her preoperative duplex, I elected to make a small arteriotomy with a #11 blade which was extended longitudinally with Potts scissors.  The vein was then spatulated to fit the size of the arteriotomy and a running anastomosis was created with 6-0 Prolene.  Prior to completion the appropriate flushing maneuvers were performed and the anastomosis was completed.  The vein did dilate somewhat now measuring about 3 mm.  She had a good Doppler signal within the fistula as well as her radial artery at the wrist.  25 mg of protamine was then given.  Once hemostasis was satisfactory the incision was closed with a 3-0 Vicryl in the subtenons tissue and a 4-0 Vicryl and the skin.  Dermabond was applied.  Patient was taken to recovery room in stable condition.  There were no immediate complications.   Disposition: To PACU stable   V. Annamarie Major, M.D. [facility] [place] Office: [phone] Pager:  [phone]

## 2017-04-15 NOTE — H&P (Signed)
[facility]  Patient name: Kelli [name]   MRN: [id number]        DOB: 07-25-1950            Sex: female   REQUESTING PROVIDER:    Renal   REASON FOR CONSULT:    Renal  HISTORY OF PRESENT ILLNESS:   Kelli [name] is a 67 y.o. female, who is referred today for dialysis access.  She is right-handed, and has minimal use of her left arm from her stroke.  She has a left-sided catheter and reports a history of a left-sided PICC line.  Patient has a history of CVA.  She is status post left carotid endarterectomy with known right carotid occlusion.  She suffers from hypercholesterolemia and is on a statin.  She is a former smoker.  She suffers from type 2 diabetes which is poorly controlled.  PAST MEDICAL HISTORY        Past Medical History:  Diagnosis Date  . Carotid artery occlusion    Occluded RICA, status post left CEA  August 2014 - Dr. Donnetta Hutching  . Cerebral infarction [facility]) Aug 2014   Bihemispheric watershed infarcts  . 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 CKD (chronic kidney disease) stage 3, GFR 30-59 ml/min (HCC)   . Closed dislocation of left humerus 07/26/2013  . DM (diabetes mellitus), type 2 ([place])   . Essential hypertension, benign   . Fibromyalgia   . Mixed hyperlipidemia   . Multiple gastric ulcers   . Urinary incontinence      FAMILY HISTORY        Family History  Problem Relation Age of Onset  . Diabetes Mother   . Diabetes Father   . Hypertension Sister   . Diabetes Brother   . Seizures Son   . Kidney disease Maternal Grandmother   . Hypertension Maternal Grandmother   . Heart disease Maternal Grandfather   . Diabetes Paternal Grandmother   . Heart disease Paternal Grandfather     SOCIAL HISTORY:   Social History        Socioeconomic History  . Marital status: Married    Spouse name: Not on file  . Number of children: Not on file  .  Years of education: Not on file  . Highest education level: Not on file  Social Needs  . Financial resource strain: Not on file  . Food insecurity - worry: Not on file  . Food insecurity - inability: Not on file  . Transportation needs - medical: Not on file  . Transportation needs - non-medical: Not on file  Occupational History  . Not on file  Tobacco Use  . Smoking status: Former Smoker    Packs/day: 1.00    Years: 44.00    Pack years: 44.00    Types: Cigarettes    Last attempt to quit: 10/03/2012    Years since quitting: 4.4  . Smokeless tobacco: Never Used  Substance and Sexual Activity  . Alcohol use: No    Alcohol/week: 0.0 oz  . Drug use: No  . Sexual activity: Yes    Birth control/protection: Surgical  Other Topics Concern  . Not on file  Social History Narrative   INITIALLY WORKED [employer]. 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    ALLERGIES:        Allergies  Allergen Reactions  . Hydrochlorothiazide Nausea And Vomiting    CURRENT  MEDICATIONS:          Current Outpatient Medications  Medication Sig Dispense Refill  . amLODipine (NORVASC) 10 MG tablet Take 10 mg by mouth daily.    Marland Kitchen atorvastatin (LIPITOR) 40 MG tablet Take 1 tablet (40 mg total) by mouth daily at 6 PM. 30 tablet 0  . budesonide (PULMICORT) 0.5 MG/2ML nebulizer solution Take 2 mLs (0.5 mg total) by nebulization 2 (two) times daily.  12  . calcitRIOL (ROCALTROL) 0.25 MCG capsule Take 1 capsule (0.25 mcg total) by mouth daily. 30 capsule 0  . clopidogrel (PLAVIX) 75 MG tablet Take 1 tablet (75 mg total) by mouth daily.    Marland Kitchen epoetin alfa (EPOGEN,PROCRIT) 51025 UNIT/ML injection Inject 1 mL (10,000 Units total) into the vein Every Tuesday,Thursday,and Saturday with dialysis. 1 mL   . feeding supplement (BOOST / RESOURCE BREEZE) LIQD Take 1 Container by mouth 3 (three) times daily between meals.  0  .  guaiFENesin-dextromethorphan (ROBITUSSIN DM) 100-10 MG/5ML syrup Take 5 mLs by mouth every 4 (four) hours as needed for cough. 118 mL 0  . hydrALAZINE (APRESOLINE) 50 MG tablet Take 1 tablet (50 mg total) by mouth 3 (three) times daily. 90 tablet 0  . insulin aspart (NOVOLOG) 100 UNIT/ML injection Inject 0-15 Units into the skin 3 (three) times daily with meals. 10 mL 11  . ipratropium-albuterol (DUONEB) 0.5-2.5 (3) MG/3ML SOLN Take 3 mLs by nebulization every 6 (six) hours as needed. 360 mL 1  . loratadine (CLARITIN) 10 MG tablet Take 1 tablet (10 mg total) by mouth daily. 30 tablet 1  . metoprolol tartrate (LOPRESSOR) 50 MG tablet Take 1 tablet (50 mg total) by mouth 2 (two) times daily.    . pantoprazole (PROTONIX) 40 MG tablet Take 1 tablet (40 mg total) by mouth 2 (two) times daily before a meal.    . traMADol (ULTRAM) 50 MG tablet Take 1 tablet (50 mg total) by mouth every 12 (twelve) hours as needed for moderate pain. 30 tablet 0  . triamcinolone cream (KENALOG) 0.1 % Apply 1 application topically 3 (three) times daily.    Marland Kitchen venlafaxine XR (EFFEXOR-XR) 150 MG 24 hr capsule Take 150 mg by mouth daily with breakfast.     No current facility-administered medications for this visit.     REVIEW OF SYSTEMS:   [X]  denotes positive finding, [ ]  denotes negative finding Cardiac  Comments:  Chest pain or chest pressure:    Shortness of breath upon exertion:    Short of breath when lying flat:    Irregular heart rhythm:        Vascular    Pain in calf, thigh, or hip brought on by ambulation:    Pain in feet at night that wakes you up from your sleep:     Blood clot in your veins:    Leg swelling:         Pulmonary    Oxygen at home:    Productive cough:     Wheezing:         Neurologic    Sudden weakness in arms or legs:  xx   Sudden numbness in arms or legs:  xx   Sudden onset of difficulty speaking or slurred speech:      Temporary loss of vision in one eye:     Problems with dizziness:         Gastrointestinal    Blood in stool:      Vomited blood:  Genitourinary    Burning when urinating:     Blood in urine:        Psychiatric    Major depression:         Hematologic    Bleeding problems:    Problems with blood clotting too easily:        Skin    Rashes or ulcers:        Constitutional    Fever or chills:     PHYSICAL EXAM:      Vitals:   03/07/17 1419  BP: (!) 142/66  Pulse: 72  Resp: 20  Temp: 98.7 F (37.1 C)  TempSrc: Oral  SpO2: 96%  Weight: 175 lb (79.4 kg)  Height: 5\' 5"  (1.651 m)    GENERAL: The patient is a well-nourished female, in no acute distress. The vital signs are documented above. CARDIAC: There is a regular rate and rhythm.  VASCULAR: Palpable left radial pulse PULMONARY: Nonlabored respirations ABDOMEN: Soft and non-tender with normal pitched bowel sounds.  MUSCULOSKELETAL: There are no major deformities or cyanosis. NEUROLOGIC: Left-sided hemiparesis SKIN: There are no ulcers or rashes noted. PSYCHIATRIC: The patient has a normal affect.  STUDIES:   Arterial study showed  positive  Allen test. Vein mapping shows a nonvisualized cephalic vein and a marginal basilic vein.  ASSESSMENT and PLAN   End-stage renal disease: I discussed proceeding with a first stage left basilic vein fistula creation.  The risks and benefits of the operation were discussed with the patient and family.  We discussed the risk of steal.  We discussed the possibility of future interventions.  I also discussed that if I did not feel the vein was of adequate caliber that a graft would be placed.  I will stop her Plavix 5 days prior to her operation.  Her operation is been scheduled for Thursday, January 17   Annamarie Major, MD [facility] [phone] Pager [phone]

## 2017-04-15 NOTE — Transfer of Care (Signed)
Immediate Anesthesia Transfer of Care Note  Patient: Kelli [name]  Procedure(s) Performed: ARTERIOVENOUS (AV) FISTULA CREATION LEFT ARM (Left Arm Upper)  Patient Location: PACU  Anesthesia Type:MAC  Level of Consciousness: awake, alert  and oriented  Airway & Oxygen Therapy: Patient Spontanous Breathing  Post-op Assessment: Report given to RN, Post -op Vital signs reviewed and stable and Patient moving all extremities X 4  Post vital signs: Reviewed and stable  Last Vitals:  Vitals:   04/15/17 1124  BP: (!) 185/73  Pulse: (!) 102  Resp: 18  Temp: 36.9 C  SpO2: 97%    Last Pain:  Vitals:   04/15/17 1124  TempSrc: Oral         Complications: Patient re-intubated

## 2017-04-15 NOTE — Anesthesia Postprocedure Evaluation (Signed)
Anesthesia Post Note  Patient: Kelli [name]  Procedure(s) Performed: ARTERIOVENOUS (AV) FISTULA CREATION LEFT ARM (Left Arm Upper)     Patient location during evaluation: PACU Anesthesia Type: MAC Level of consciousness: awake and alert Pain management: pain level controlled Vital Signs Assessment: post-procedure vital signs reviewed and stable Respiratory status: spontaneous breathing, nonlabored ventilation and respiratory function stable Cardiovascular status: stable and blood pressure returned to baseline Anesthetic complications: no    Last Vitals:  Vitals:   04/15/17 1530 04/15/17 1538  BP:  (!) 162/67  Pulse:  (!) 101  Resp:  17  Temp: 36.6 C   SpO2:  97%    Last Pain:  Vitals:   04/15/17 1124  TempSrc: Oral                 [name]

## 2017-04-16 ENCOUNTER — Encounter (HOSPITAL_COMMUNITY): Payer: Self-pay | Admitting: Surgery

## 2017-04-17 LAB — GLUCOSE, CAPILLARY: GLUCOSE-CAPILLARY: 108 mg/dL — AB (ref 65–99)

## 2017-04-18 ENCOUNTER — Telehealth: Payer: Self-pay | Admitting: Surgery

## 2017-04-18 NOTE — Telephone Encounter (Signed)
Sched appt 05/30/17; lab at 3:00 and MD at 3:45. Spoke to pt's sister to inform them of appt.

## 2017-04-18 NOTE — Telephone Encounter (Signed)
-----   Message from Mena Goes, RN sent at 04/15/2017  2:30 PM EST ----- Regarding: 6 weeks postop appt   ----- Message ----- From: Iline Oven Sent: 04/15/2017   2:19 PM To: [facility] Charge Pool  Can you schedule an appt for this pt with fistula duplex in 6 weeks with Dr. Trula Slade.  PO L brachiobasilic. Thanks, [facility]

## 2017-04-19 ENCOUNTER — Other Ambulatory Visit: Payer: Self-pay

## 2017-04-19 DIAGNOSIS — N186 End stage renal disease: Secondary | ICD-10-CM

## 2017-04-20 ENCOUNTER — Encounter: Payer: Self-pay | Admitting: Physician Assistant

## 2017-04-20 ENCOUNTER — Other Ambulatory Visit: Payer: Self-pay | Admitting: *Deleted

## 2017-04-20 ENCOUNTER — Ambulatory Visit (INDEPENDENT_AMBULATORY_CARE_PROVIDER_SITE_OTHER): Payer: Self-pay | Admitting: Physician Assistant

## 2017-04-20 ENCOUNTER — Encounter: Payer: Self-pay | Admitting: *Deleted

## 2017-04-20 VITALS — BP 149/62 | HR 61 | Temp 97.6°F | Resp 16 | Ht 62.0 in

## 2017-04-20 DIAGNOSIS — N186 End stage renal disease: Secondary | ICD-10-CM

## 2017-04-20 DIAGNOSIS — Z992 Dependence on renal dialysis: Secondary | ICD-10-CM

## 2017-04-20 NOTE — H&P (View-Only) (Signed)
POST OPERATIVE OFFICE NOTE    CC:  F/u for surgery  HPI:  This is a 67 y.o. female who is s/p left 1st stage BVT on 04/15/17 by Dr. Trula Slade.   At the time of surgery, the basilic vein was marginal, but he elected to proceed with the basilic vein fistula creation as the vein looked better on u/s than it did on exposure.  There was a nice thrill to her fistula but did not dilate.  At her initial visit with Dr. Trula Slade, he discussed with her that she may need an AV graft if the vein was not adequate.   She presents today because at dialysis, her new fistula did not have a thrill or a bruit.  She currently is dialyzing via a left IJ tunneled dialysis catheter that was placed by IR on 01/14/17.    The pt is right hand dominant.  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  Dr. Donnetta Hutching performed her left carotid endarterectomy in 2014.   She is on Plavix.    The pt is on a statin for cholesterol management.  She is on a beta blocker and CCB for blood pressure management.  She is on insulin for diabetes.   She is here with her husband who she has been married to for 45 years.   Past Medical History:  Diagnosis Date  . Anemia   . Anxiety   . Carotid artery occlusion    Occluded RICA, status post left CEA  August 2014 - Dr. Donnetta Hutching  . Cerebral infarction [facility]) Aug 2014   Bihemispheric watershed infarcts  . Cerebral infarction involving left cerebellar artery [facility]) Feb 2015  . CHF (congestive heart failure) ([place])   . CKD (chronic kidney disease) stage 3, GFR 30-59 ml/min (HCC)    dialysis T/Th/sa  . Closed dislocation of left humerus 07/26/2013  . Depression   . DM (diabetes mellitus), type 2 ([place])   . Essential hypertension, benign   . Fibromyalgia   . Mixed hyperlipidemia   . Multiple gastric ulcers   . Pneumonia   . Stroke [facility])    pt states she cannot walk, left arm weakness  . Urinary incontinence     Allergies  Allergen Reactions  . Hydrochlorothiazide  Nausea And Vomiting    Current Outpatient Medications  Medication Sig Dispense Refill  . amLODipine (NORVASC) 10 MG tablet Take 10 mg by mouth daily.    Marland Kitchen atorvastatin (LIPITOR) 40 MG tablet Take 1 tablet (40 mg total) by mouth daily at 6 PM. 30 tablet 0  . budesonide (PULMICORT) 0.5 MG/2ML nebulizer solution Take 2 mLs (0.5 mg total) by nebulization 2 (two) times daily.  12  . calcitRIOL (ROCALTROL) 0.25 MCG capsule Take 1 capsule (0.25 mcg total) by mouth daily. 30 capsule 0  . clopidogrel (PLAVIX) 75 MG tablet Take 1 tablet (75 mg total) by mouth daily.    Marland Kitchen epoetin alfa (EPOGEN,PROCRIT) 93810 UNIT/ML injection Inject 1 mL (10,000 Units total) into the vein Every Tuesday,Thursday,and Saturday with dialysis. 1 mL   . guaiFENesin-dextromethorphan (ROBITUSSIN DM) 100-10 MG/5ML syrup Take 5 mLs by mouth every 4 (four) hours as needed for cough. 118 mL 0  . hydrALAZINE (APRESOLINE) 50 MG tablet Take 1 tablet (50 mg total) by mouth 3 (three) times daily. 90 tablet 0  . HYDROcodone-acetaminophen (NORCO) 5-325 MG tablet Take 1 tablet by mouth every 6 (six) hours as needed for moderate pain. 8 tablet 0  .  insulin aspart (NOVOLOG) 100 UNIT/ML injection Inject 0-15 Units into the skin 3 (three) times daily with meals. (Patient taking differently: Inject into the skin 3 (three) times daily with meals. ) 10 mL 11  . ipratropium-albuterol (DUONEB) 0.5-2.5 (3) MG/3ML SOLN Take 3 mLs by nebulization every 6 (six) hours as needed. 360 mL 1  . loratadine (CLARITIN) 10 MG tablet Take 1 tablet (10 mg total) by mouth daily. 30 tablet 1  . metoprolol tartrate (LOPRESSOR) 50 MG tablet Take 1 tablet (50 mg total) by mouth 2 (two) times daily.    . pantoprazole (PROTONIX) 40 MG tablet Take 1 tablet (40 mg total) by mouth 2 (two) times daily before a meal.    . SODIUM BICARBONATE PO Take by mouth 2 (two) times daily after a meal.    . torsemide (DEMADEX) 20 MG tablet Take 20 mg by mouth daily.    . traMADol (ULTRAM)  50 MG tablet Take 1 tablet (50 mg total) by mouth every 12 (twelve) hours as needed for moderate pain. 30 tablet 0  . triamcinolone cream (KENALOG) 0.1 % Apply 1 application topically 3 (three) times daily.    Marland Kitchen venlafaxine XR (EFFEXOR-XR) 150 MG 24 hr capsule Take 150 mg by mouth daily with breakfast.     No current facility-administered medications for this visit.    Family History  Problem Relation Age of Onset  . Diabetes Mother   . Diabetes Father   . Hypertension Sister   . Diabetes Brother   . Seizures Son   . Kidney disease Maternal Grandmother   . Hypertension Maternal Grandmother   . Heart disease Maternal Grandfather   . Diabetes Paternal Grandmother   . Heart disease Paternal Grandfather    Social History   Socioeconomic History  . Marital status: Married    Spouse name: Not on file  . Number of children: Not on file  . Years of education: Not on file  . Highest education level: Not on file  Social Needs  . Financial resource strain: Not on file  . Food insecurity - worry: Not on file  . Food insecurity - inability: Not on file  . Transportation needs - medical: Not on file  . Transportation needs - non-medical: Not on file  Occupational History  . Not on file  Tobacco Use  . Smoking status: Former Smoker    Packs/day: 1.00    Years: 44.00    Pack years: 44.00    Types: Cigarettes    Last attempt to quit: 10/03/2012    Years since quitting: 4.5  . Smokeless tobacco: Never Used  Substance and Sexual Activity  . Alcohol use: No    Alcohol/week: 0.0 oz  . Drug use: No  . Sexual activity: Yes    Birth control/protection: Surgical  Other Topics Concern  . Not on file  Social History Narrative   INITIALLY WORKED [place]. 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     ROS:  See HPI  Physical Exam:  Vitals:   04/20/17 1544  BP: (!) 149/62  Pulse: 61  Resp: 16  Temp: 97.6 F (36.4 C)  SpO2: 98%   Vitals:    04/20/17 1544  Height: 5\' 2"  (1.575 m)   Body mass index is 32.92 kg/m.   General:  Sitting in wheelchair in no distress Incision:  Left antecubital space incision is healing nicely Extremities:  Easily palpable bilateral radial pulses; there is no palpable thrill  or audible bruit within the left 1st stage BVT HENT:  Head normocephalic; well healed left carotid endarterectomy incision Neuro: left hemiparesis    Assessment/Plan:  This is a 67 y.o. female who is s/p: Left 1st stage basilic vein transposition on 04/15/17 by Dr. Trula Slade who returns today due to no thrill or bruit in new left 1st stage BVT  -at the time of surgery, the left basilic vein was marginal as Dr. Trula Slade felt the vein looked better on u/s than it did on exposure.  Pre-operatively, he had discussed with her & her husband the possibility of needed a graft.  Since the vein was marginal and it has only been a few days, will proceed with LUA AVG.  She will need a M/W/F since she dialyzes in [place] on T/T/S and she & her husband prefer later in the morning if possible.   -she currently is dialyzing via left IJ [facility] that was placed by IR in November 2018 -she is on Plavix and we will stop that 5 days prior to surgery as it was stopped 5 days prior to her fistula creation.   -discussed risks vs benefits and pt and husband wish to proceed.    Leontine Locket, PA-C [facility] [phone]  Clinic MD:  Scot Dock

## 2017-04-20 NOTE — Progress Notes (Addendum)
POST OPERATIVE OFFICE NOTE    CC:  F/u for surgery  HPI:  This is a 67 y.o. female who is s/p left 1st stage BVT on 04/15/17 by Dr. Trula Slade.   At the time of surgery, the basilic vein was marginal, but he elected to proceed with the basilic vein fistula creation as the vein looked better on u/s than it did on exposure.  There was a nice thrill to her fistula but did not dilate.  At her initial visit with Dr. Trula Slade, he discussed with her that she may need an AV graft if the vein was not adequate.   She presents today because at dialysis, her new fistula did not have a thrill or a bruit.  She currently is dialyzing via a left IJ tunneled dialysis catheter that was placed by IR on 01/14/17.    The pt is right hand dominant.  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  Dr. Donnetta Hutching performed her left carotid endarterectomy in 2014.   She is on Plavix.    The pt is on a statin for cholesterol management.  She is on a beta blocker and CCB for blood pressure management.  She is on insulin for diabetes.   She is here with her husband who she has been married to for 45 years.   Past Medical History:  Diagnosis Date  . Anemia   . Anxiety   . Carotid artery occlusion    Occluded RICA, status post left CEA  August 2014 - Dr. Donnetta Hutching  . Cerebral infarction [facility]) Aug 2014   Bihemispheric watershed infarcts  . Cerebral infarction involving left cerebellar artery [facility]) Feb 2015  . CHF (congestive heart failure) ([place])   . CKD (chronic kidney disease) stage 3, GFR 30-59 ml/min (HCC)    dialysis T/Th/sa  . Closed dislocation of left humerus 07/26/2013  . Depression   . DM (diabetes mellitus), type 2 ([place])   . Essential hypertension, benign   . Fibromyalgia   . Mixed hyperlipidemia   . Multiple gastric ulcers   . Pneumonia   . Stroke [facility])    pt states she cannot walk, left arm weakness  . Urinary incontinence     Allergies  Allergen Reactions  . Hydrochlorothiazide  Nausea And Vomiting    Current Outpatient Medications  Medication Sig Dispense Refill  . amLODipine (NORVASC) 10 MG tablet Take 10 mg by mouth daily.    Marland Kitchen atorvastatin (LIPITOR) 40 MG tablet Take 1 tablet (40 mg total) by mouth daily at 6 PM. 30 tablet 0  . budesonide (PULMICORT) 0.5 MG/2ML nebulizer solution Take 2 mLs (0.5 mg total) by nebulization 2 (two) times daily.  12  . calcitRIOL (ROCALTROL) 0.25 MCG capsule Take 1 capsule (0.25 mcg total) by mouth daily. 30 capsule 0  . clopidogrel (PLAVIX) 75 MG tablet Take 1 tablet (75 mg total) by mouth daily.    Marland Kitchen epoetin alfa (EPOGEN,PROCRIT) 01751 UNIT/ML injection Inject 1 mL (10,000 Units total) into the vein Every Tuesday,Thursday,and Saturday with dialysis. 1 mL   . guaiFENesin-dextromethorphan (ROBITUSSIN DM) 100-10 MG/5ML syrup Take 5 mLs by mouth every 4 (four) hours as needed for cough. 118 mL 0  . hydrALAZINE (APRESOLINE) 50 MG tablet Take 1 tablet (50 mg total) by mouth 3 (three) times daily. 90 tablet 0  . HYDROcodone-acetaminophen (NORCO) 5-325 MG tablet Take 1 tablet by mouth every 6 (six) hours as needed for moderate pain. 8 tablet 0  .  insulin aspart (NOVOLOG) 100 UNIT/ML injection Inject 0-15 Units into the skin 3 (three) times daily with meals. (Patient taking differently: Inject into the skin 3 (three) times daily with meals. ) 10 mL 11  . ipratropium-albuterol (DUONEB) 0.5-2.5 (3) MG/3ML SOLN Take 3 mLs by nebulization every 6 (six) hours as needed. 360 mL 1  . loratadine (CLARITIN) 10 MG tablet Take 1 tablet (10 mg total) by mouth daily. 30 tablet 1  . metoprolol tartrate (LOPRESSOR) 50 MG tablet Take 1 tablet (50 mg total) by mouth 2 (two) times daily.    . pantoprazole (PROTONIX) 40 MG tablet Take 1 tablet (40 mg total) by mouth 2 (two) times daily before a meal.    . SODIUM BICARBONATE PO Take by mouth 2 (two) times daily after a meal.    . torsemide (DEMADEX) 20 MG tablet Take 20 mg by mouth daily.    . traMADol (ULTRAM)  50 MG tablet Take 1 tablet (50 mg total) by mouth every 12 (twelve) hours as needed for moderate pain. 30 tablet 0  . triamcinolone cream (KENALOG) 0.1 % Apply 1 application topically 3 (three) times daily.    Marland Kitchen venlafaxine XR (EFFEXOR-XR) 150 MG 24 hr capsule Take 150 mg by mouth daily with breakfast.     No current facility-administered medications for this visit.    Family History  Problem Relation Age of Onset  . Diabetes Mother   . Diabetes Father   . Hypertension Sister   . Diabetes Brother   . Seizures Son   . Kidney disease Maternal Grandmother   . Hypertension Maternal Grandmother   . Heart disease Maternal Grandfather   . Diabetes Paternal Grandmother   . Heart disease Paternal Grandfather    Social History   Socioeconomic History  . Marital status: Married    Spouse name: Not on file  . Number of children: Not on file  . Years of education: Not on file  . Highest education level: Not on file  Social Needs  . Financial resource strain: Not on file  . Food insecurity - worry: Not on file  . Food insecurity - inability: Not on file  . Transportation needs - medical: Not on file  . Transportation needs - non-medical: Not on file  Occupational History  . Not on file  Tobacco Use  . Smoking status: Former Smoker    Packs/day: 1.00    Years: 44.00    Pack years: 44.00    Types: Cigarettes    Last attempt to quit: 10/03/2012    Years since quitting: 4.5  . Smokeless tobacco: Never Used  Substance and Sexual Activity  . Alcohol use: No    Alcohol/week: 0.0 oz  . Drug use: No  . Sexual activity: Yes    Birth control/protection: Surgical  Other Topics Concern  . 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     ROS:  See HPI  Physical Exam:  Vitals:   04/20/17 1544  BP: (!) 149/62  Pulse: 61  Resp: 16  Temp: 97.6 F (36.4 C)  SpO2: 98%   Vitals:    04/20/17 1544  Height: 5\' 2"  (1.575 m)   Body mass index is 32.92 kg/m.   General:  Sitting in wheelchair in no distress Incision:  Left antecubital space incision is healing nicely Extremities:  Easily palpable bilateral radial pulses; there is no palpable thrill  or audible bruit within the left 1st stage BVT HENT:  Head normocephalic; well healed left carotid endarterectomy incision Neuro: left hemiparesis    Assessment/Plan:  This is a 67 y.o. female who is s/p: Left 1st stage basilic vein transposition on 04/15/17 by Dr. Trula Slade who returns today due to no thrill or bruit in new left 1st stage BVT  -at the time of surgery, the left basilic vein was marginal as Dr. Trula Slade felt the vein looked better on u/s than it did on exposure.  Pre-operatively, he had discussed with her & her husband the possibility of needed a graft.  Since the vein was marginal and it has only been a few days, will proceed with LUA AVG.  She will need a M/W/F since she dialyzes in [place] on T/T/S and she & her husband prefer later in the morning if possible.   -she currently is dialyzing via left IJ [facility] that was placed by IR in November 2018 -she is on Plavix and we will stop that 5 days prior to surgery as it was stopped 5 days prior to her fistula creation.   -discussed risks vs benefits and pt and husband wish to proceed.    Leontine Locket, PA-C [facility] [phone]  Clinic MD:  Scot Dock

## 2017-04-20 NOTE — Progress Notes (Signed)
Surgery scheduled with Dr. Donnetta Hutching at patient's request. Instructed to hold Plavix for 5 days per [facility].

## 2017-04-20 NOTE — Progress Notes (Signed)
Instructed to hold Plavix for 5 days pre-op by [facility] PA.  Surgery with Dr. Donnetta Hutching at patient's request.

## 2017-04-22 ENCOUNTER — Other Ambulatory Visit: Payer: Self-pay | Admitting: Radiology

## 2017-04-22 ENCOUNTER — Other Ambulatory Visit (HOSPITAL_COMMUNITY): Payer: Self-pay | Admitting: Nephrology

## 2017-04-22 DIAGNOSIS — N186 End stage renal disease: Secondary | ICD-10-CM

## 2017-04-22 DIAGNOSIS — Z992 Dependence on renal dialysis: Principal | ICD-10-CM

## 2017-04-25 ENCOUNTER — Other Ambulatory Visit (HOSPITAL_COMMUNITY): Payer: Self-pay | Admitting: Nephrology

## 2017-04-25 ENCOUNTER — Encounter (HOSPITAL_COMMUNITY): Payer: Self-pay

## 2017-04-25 ENCOUNTER — Ambulatory Visit (HOSPITAL_COMMUNITY)
Admission: RE | Admit: 2017-04-25 | Discharge: 2017-04-25 | Disposition: A | Discharge status: Home / Self Care | Payer: Medicare Other | Source: Ambulatory Visit | Attending: Nephrology | Admitting: Nephrology

## 2017-04-25 DIAGNOSIS — M797 Fibromyalgia: Secondary | ICD-10-CM | POA: Diagnosis not present

## 2017-04-25 DIAGNOSIS — T8249XA Other complication of vascular dialysis catheter, initial encounter: Secondary | ICD-10-CM | POA: Diagnosis not present

## 2017-04-25 DIAGNOSIS — Z794 Long term (current) use of insulin: Secondary | ICD-10-CM | POA: Insufficient documentation

## 2017-04-25 DIAGNOSIS — I69334 Monoplegia of upper limb following cerebral infarction affecting left non-dominant side: Secondary | ICD-10-CM | POA: Diagnosis not present

## 2017-04-25 DIAGNOSIS — E782 Mixed hyperlipidemia: Secondary | ICD-10-CM | POA: Insufficient documentation

## 2017-04-25 DIAGNOSIS — I509 Heart failure, unspecified: Secondary | ICD-10-CM | POA: Insufficient documentation

## 2017-04-25 DIAGNOSIS — F329 Major depressive disorder, single episode, unspecified: Secondary | ICD-10-CM | POA: Diagnosis not present

## 2017-04-25 DIAGNOSIS — E1122 Type 2 diabetes mellitus with diabetic chronic kidney disease: Secondary | ICD-10-CM | POA: Diagnosis not present

## 2017-04-25 DIAGNOSIS — Z992 Dependence on renal dialysis: Secondary | ICD-10-CM

## 2017-04-25 DIAGNOSIS — F419 Anxiety disorder, unspecified: Secondary | ICD-10-CM | POA: Insufficient documentation

## 2017-04-25 DIAGNOSIS — Z87891 Personal history of nicotine dependence: Secondary | ICD-10-CM | POA: Insufficient documentation

## 2017-04-25 DIAGNOSIS — Z4901 Encounter for fitting and adjustment of extracorporeal dialysis catheter: Secondary | ICD-10-CM | POA: Diagnosis not present

## 2017-04-25 DIAGNOSIS — N186 End stage renal disease: Secondary | ICD-10-CM | POA: Insufficient documentation

## 2017-04-25 DIAGNOSIS — Z7902 Long term (current) use of antithrombotics/antiplatelets: Secondary | ICD-10-CM | POA: Insufficient documentation

## 2017-04-25 DIAGNOSIS — I132 Hypertensive heart and chronic kidney disease with heart failure and with stage 5 chronic kidney disease, or end stage renal disease: Secondary | ICD-10-CM | POA: Diagnosis not present

## 2017-04-25 DIAGNOSIS — Z7951 Long term (current) use of inhaled steroids: Secondary | ICD-10-CM | POA: Diagnosis not present

## 2017-04-25 DIAGNOSIS — I6521 Occlusion and stenosis of right carotid artery: Secondary | ICD-10-CM | POA: Insufficient documentation

## 2017-04-25 HISTORY — PX: IR FLUORO GUIDE CV LINE LEFT: IMG2282

## 2017-04-25 MED ORDER — CHLORHEXIDINE GLUCONATE 4 % EX LIQD
CUTANEOUS | Status: AC
  Filled 2017-04-25: qty 15

## 2017-04-25 MED ORDER — CEFAZOLIN SODIUM-DEXTROSE 2-4 GM/100ML-% IV SOLN
2.0000 g | INTRAVENOUS | Status: AC
  Administered 2017-04-25: 2 g via INTRAVENOUS

## 2017-04-25 MED ORDER — SODIUM CHLORIDE 0.9 % IV SOLN: INTRAVENOUS | Status: DC

## 2017-04-25 MED ORDER — CEFAZOLIN SODIUM-DEXTROSE 2-4 GM/100ML-% IV SOLN
INTRAVENOUS | Status: AC
  Administered 2017-04-25: 2 g via INTRAVENOUS
  Filled 2017-04-25: qty 100

## 2017-04-25 MED ORDER — LIDOCAINE HCL 1 % IJ SOLN
INTRAMUSCULAR | Status: AC
  Filled 2017-04-25: qty 20

## 2017-04-25 MED ORDER — HEPARIN SODIUM (PORCINE) 1000 UNIT/ML IJ SOLN
INTRAMUSCULAR | Status: AC
  Administered 2017-04-25: 3.8 mL
  Filled 2017-04-25: qty 1

## 2017-04-25 MED ORDER — LIDOCAINE HCL (PF) 1 % IJ SOLN
INTRAMUSCULAR | Status: DC | PRN
  Administered 2017-04-25: 8 mL

## 2017-04-25 MED ORDER — ONDANSETRON HCL 4 MG/2ML IJ SOLN
4.0000 mg | Freq: Once | INTRAMUSCULAR | Status: AC
  Administered 2017-04-25: 4 mg via INTRAVENOUS

## 2017-04-25 MED ORDER — ONDANSETRON HCL 4 MG/2ML IJ SOLN
INTRAMUSCULAR | Status: AC
  Administered 2017-04-25: 4 mg via INTRAVENOUS
  Filled 2017-04-25: qty 2

## 2017-04-25 NOTE — Procedures (Signed)
Interventional Radiology Procedure Note  Procedure: Routine exchange of left IJ approach 23cm tip to cuff HD catheter. Tip is positioned at the superior cavoatrial junction and catheter is ready for immediate use. Confirmed excellent flow.   Complications: None  Recommendations:  - Ok to use - Do not submerge  - Routine line care   Signed,  Dulcy Fanny. Earleen Newport, DO

## 2017-04-25 NOTE — H&P (Signed)
Chief Complaint: Patient was seen in consultation today for exchange of HD cath at the request of [place]  Referring Physician(s): Fran Lowes  Supervising Physician: Corrie Mckusick  Patient Status: [facility] - Out-pt  History of Present Illness: Kelli [name] is a 67 y.o. female with ESRD. She has a left IJ HD cath which has been having some difficulty recently. She is referred for exchange. She has been NPO this am. Family is with her.   Past Medical History:  Diagnosis Date  . Anemia   . Anxiety   . Carotid artery occlusion    Occluded RICA, status post left CEA  August 2014 - Dr. Donnetta Hutching  . Cerebral infarction [facility]) Aug 2014   Bihemispheric watershed infarcts  . Cerebral infarction involving left cerebellar artery [facility]) Feb 2015  . CHF (congestive heart failure) ([place])   . CKD (chronic kidney disease) stage 3, GFR 30-59 ml/min (HCC)    dialysis [place]  . Closed dislocation of left humerus 07/26/2013  . Depression   . DM (diabetes mellitus), type 2 ([employer])   . Essential hypertension, benign   . Fibromyalgia   . Mixed hyperlipidemia   . Multiple gastric ulcers   . Pneumonia   . Stroke [facility])    pt states she cannot walk, left arm weakness  . Urinary incontinence     Past Surgical History:  Procedure Laterality Date  . AV FISTULA PLACEMENT Left 04/15/2017   Procedure: ARTERIOVENOUS (AV) FISTULA CREATION LEFT ARM;  Surgeon: Serafina Mitchell, MD;  Location: [place];  Service: Vascular;  Laterality: Left;  . BIOPSY  01/22/2017   Procedure: BIOPSY;  Surgeon: Danie Binder, MD;  [facility];  Service: Endoscopy;;  gastric  . COMBINED HYSTERECTOMY VAGINAL W/ MMK / A&P REPAIR  1981  . ENDARTERECTOMY Left 10/06/2012   Procedure: Carotid Endarterectomy with Finesse patch angioplasty;  Surgeon: [name], MD;  Location: [place];  Service: Vascular;  Laterality: Left;  . ESOPHAGOGASTRODUODENOSCOPY N/A 04/26/2014   Procedure: ESOPHAGOGASTRODUODENOSCOPY  (EGD);  Surgeon: Lear Ng, MD;  Location: [facility] ENDOSCOPY;  Service: Endoscopy;  Laterality: N/A;  . ESOPHAGOGASTRODUODENOSCOPY N/A 01/22/2017   Procedure: ESOPHAGOGASTRODUODENOSCOPY (EGD);  Surgeon: Danie Binder, MD;  [facility];  Service: Endoscopy;  Laterality: N/A;  . FLEXIBLE SIGMOIDOSCOPY N/A 01/22/2017   Procedure: FLEXIBLE SIGMOIDOSCOPY;  Surgeon: Danie Binder, MD;  [facility];  Service: Endoscopy;  Laterality: N/A;  . IR CV LINE INJECTION  02/04/2017  . IR FLUORO GUIDE CV LINE LEFT  01/14/2017  . IR US GUIDE VASC ACCESS LEFT  01/14/2017  . LOOP RECORDER IMPLANT  04/16/13   MDT LinQ implanted for cryptogenic stroke  . [name] WITHOUT CARDIOVERSION N/A 04/16/2013   Procedure: TRANSESOPHAGEAL ECHOCARDIOGRAM ([name]);  Surgeon: Josue Hector, MD;  Location: [facility] ENDOSCOPY;  Service: Cardiovascular;  Laterality: N/A;  . URETHRAL DILATION  1980's  . VAGINAL HYSTERECTOMY  1981   "partial" (10/04/2012)    Allergies: Hydrochlorothiazide  Medications: Prior to Admission medications   Medication Sig Start Date End Date Taking? Authorizing Provider  amLODipine (NORVASC) 10 MG tablet Take 10 mg by mouth daily.    [provider]  atorvastatin (LIPITOR) 40 MG tablet Take 1 tablet (40 mg total) by mouth daily at 6 PM. 10/20/16   Cherene Altes, MD  budesonide (PULMICORT) 0.5 MG/2ML nebulizer solution Take 2 mLs (0.5 mg total) by nebulization 2 (two) times daily. Patient not taking: Reported on 04/21/2017 01/27/17  Kathie Dike, MD  calcitRIOL (ROCALTROL) 0.25 MCG capsule Take 1 capsule (0.25 mcg total) by mouth daily. 01/30/17   Kathie Dike, MD  clopidogrel (PLAVIX) 75 MG tablet Take 1 tablet (75 mg total) by mouth daily. 01/31/17   Kathie Dike, MD  epoetin alfa (EPOGEN,PROCRIT) 18841 UNIT/ML injection Inject 1 mL (10,000 Units total) into the vein Every Tuesday,Thursday,and Saturday with dialysis. 01/29/17   Kathie Dike, MD    guaiFENesin-dextromethorphan (ROBITUSSIN DM) 100-10 MG/5ML syrup Take 5 mLs by mouth every 4 (four) hours as needed for cough. 01/27/17   Kathie Dike, MD  hydrALAZINE (APRESOLINE) 50 MG tablet Take 1 tablet (50 mg total) by mouth 3 (three) times daily. 10/20/16   Cherene Altes, MD  HYDROcodone-acetaminophen (NORCO) 5-325 MG tablet Take 1 tablet by mouth every 6 (six) hours as needed for moderate pain. 04/15/17   Dagoberto Ligas, PA-C  insulin aspart (NOVOLOG) 100 UNIT/ML injection Inject 0-15 Units into the skin 3 (three) times daily with meals. Patient not taking: Reported on 04/21/2017 01/19/17   Reyne Dumas, MD  ipratropium-albuterol (DUONEB) 0.5-2.5 (3) MG/3ML SOLN Take 3 mLs by nebulization every 6 (six) hours as needed. Patient not taking: Reported on 04/21/2017 01/19/17   Reyne Dumas, MD  loratadine (CLARITIN) 10 MG tablet Take 1 tablet (10 mg total) by mouth daily. Patient taking differently: Take 10 mg by mouth daily as needed for allergies.  01/20/17   Reyne Dumas, MD  metoprolol succinate (TOPROL-XL) 100 MG 24 hr tablet Take 100 mg by mouth daily. 04/05/17   [provider]  pantoprazole (PROTONIX) 40 MG tablet Take 1 tablet (40 mg total) by mouth 2 (two) times daily before a meal. 01/27/17   Kathie Dike, MD  potassium chloride SA (K-DUR,KLOR-CON) 20 MEQ tablet Take 20 mEq by mouth 2 (two) times daily. 04/05/17   [provider]  sodium bicarbonate 650 MG tablet Take 650 mg by mouth 3 (three) times daily.     [provider]  torsemide (DEMADEX) 20 MG tablet Take 20 mg by mouth daily.    [provider]  traMADol (ULTRAM) 50 MG tablet Take 1 tablet (50 mg total) by mouth every 12 (twelve) hours as needed for moderate pain. Patient not taking: Reported on 04/21/2017 01/19/17   Reyne Dumas, MD  triamcinolone cream (KENALOG) 0.1 % Apply 1 application topically 3 (three) times daily as needed (for rash).     [provider]   venlafaxine XR (EFFEXOR-XR) 150 MG 24 hr capsule Take 150 mg by mouth daily with breakfast.    [provider]     Family History  Problem Relation Age of Onset  . Diabetes Mother   . Diabetes Father   . Hypertension Sister   . Diabetes Brother   . Seizures Son   . Kidney disease Maternal Grandmother   . Hypertension Maternal Grandmother   . Heart disease Maternal Grandfather   . Diabetes Paternal Grandmother   . Heart disease Paternal Grandfather     Social History   Socioeconomic History  . Marital status: Married    Spouse name: None  . Number of children: None  . Years of education: None  . Highest education level: None  Social Needs  . Financial resource strain: None  . Food insecurity - worry: None  . Food insecurity - inability: None  . Transportation needs - medical: None  . Transportation needs - non-medical: None  Occupational History  . None  Tobacco Use  . Smoking status:  Former Smoker    Packs/day: 1.00    Years: 44.00    Pack years: 44.00    Types: Cigarettes    Last attempt to quit: 10/03/2012    Years since quitting: 4.5  . Smokeless tobacco: Never Used  Substance and Sexual Activity  . Alcohol use: No    Alcohol/week: 0.0 oz  . Drug use: No  . Sexual activity: Yes    Birth control/protection: Surgical  Other Topics Concern  . 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     Review of Systems: A 12 point ROS discussed and pertinent positives are indicated in the HPI above.  All other systems are negative.  Review of Systems  Vital Signs: There were no vitals taken for this visit.  Physical Exam  Constitutional: She is oriented to person, place, and time. She appears well-developed. No distress.  HENT:  Head: Normocephalic.  Mouth/Throat: Oropharynx is clear and moist.  Neck: Normal range of motion. No JVD present. No tracheal  deviation present.  (L)IJ HD cath  Cardiovascular: Normal rate, regular rhythm and normal heart sounds.  Pulmonary/Chest: Effort normal and breath sounds normal. No respiratory distress.  Neurological: She is alert and oriented to person, place, and time.  Skin: Skin is warm and dry.  Psychiatric: She has a normal mood and affect.     Imaging: No results found.  Labs:  CBC: Recent Labs    01/26/17 0613 01/27/17 0452 01/27/17 2340 01/30/17 0607 02/18/17 1618 04/15/17 1211  WBC 6.4 7.5 6.8 5.1  --   --   HGB 7.9* 8.0* 8.7* 8.5* 6.5* 10.2*  HCT 25.4* 25.8* 27.6* 27.2* 19.0* 30.0*  PLT 141* 159 179 246  --   --     COAGS: Recent Labs    01/13/17 1142  INR 1.05  APTT 25    BMP: Recent Labs    01/26/17 0613 01/27/17 0452 01/27/17 2340 01/30/17 0607 02/18/17 1618 04/15/17 1211  NA 138 137 135 137 138 136  K 3.7 4.1 4.1 3.7 3.5 4.2  CL 102 101 98* 99* 98*  --   CO2 27 25 27 28   --   --   GLUCOSE 142* 129* 204* 122* 134* 139*  BUN 50* 63* 35* 25* 15  --   CALCIUM 6.8* 6.7* 7.0* 7.4*  --   --   CREATININE 3.78* 4.55* 3.15* 2.91* 3.50*  --   GFRNONAA 11* 9* 14* 16*  --   --   GFRAA 13* 11* 17* 18*  --   --     LIVER FUNCTION TESTS: Recent Labs    10/13/16 0352  10/19/16 1105  01/25/17 0555 01/25/17 1533 01/26/17 0613 01/27/17 2340  BILITOT 0.4  --  0.4  --   --   --   --  0.5  AST 13*  --  14*  --   --   --   --  16  ALT 11*  --  14  --   --   --   --  10*  ALKPHOS 68  --  59  --   --   --   --  70  PROT 6.9  --  6.6  --   --   --   --  5.8*  ALBUMIN 3.3*   < > 3.0*  3.0*   < > 2.7* 2.7* 2.5* 2.9*   < > =  values in this interval not displayed.    TUMOR MARKERS: No results for input(s): AFPTM, CEA, CA199, CHROMGRNA in the last 8760 hours.  Assessment and Plan: ESRD Dysfunctional HD cath For exchange Risks and benefits discussed with the patient including, but not limited to bleeding, infection, pneumothorax, or fibrin sheath development and  need for additional procedures.  All of the patient's questions were answered, patient is agreeable to proceed. Consent signed and in chart.    Thank you for this interesting consult.  I greatly enjoyed meeting Kelli [name] and look forward to participating in their care.  A copy of this report was sent to the requesting provider on this date.  Electronically Signed: Ascencion Dike, PA-C 04/25/2017, 10:05 AM   I spent a total of 20 minutes in face to face in clinical consultation, greater than 50% of which was counseling/coordinating care for HD cath exchnage

## 2017-04-26 DIAGNOSIS — Z992 Dependence on renal dialysis: Secondary | ICD-10-CM | POA: Diagnosis not present

## 2017-04-28 ENCOUNTER — Encounter (HOSPITAL_COMMUNITY): Payer: Self-pay | Admitting: *Deleted

## 2017-04-29 ENCOUNTER — Encounter (HOSPITAL_COMMUNITY): Payer: Self-pay | Admitting: *Deleted

## 2017-04-29 ENCOUNTER — Other Ambulatory Visit: Payer: Self-pay

## 2017-04-29 NOTE — Progress Notes (Signed)
Spoke with pt for pre-op call. Pt had surgery on 04/14/17. Pt verified that nothing has changed with her medications, medical and surgical history. She denies any recent chest pain or sob. Pt is a type 2 diabetic. Last A1C was 5.4 in November, 2018. Pt states she doesn't check her blood sugar at home. Pt asked that I call her husband and give him the list of medications she is to take the morning of surgery. I called him and left a message on his voicemail with the medications that she can take, time of arrival and phone # to the Short Stay Unit.

## 2017-05-02 ENCOUNTER — Ambulatory Visit (HOSPITAL_COMMUNITY): Admission: RE | Admit: 2017-05-02 | Payer: 59 | Source: Ambulatory Visit | Admitting: Vascular Surgery

## 2017-05-02 ENCOUNTER — Telehealth: Payer: Self-pay | Admitting: *Deleted

## 2017-05-02 SURGERY — INSERTION OF ARTERIOVENOUS (AV) GORE-TEX GRAFT ARM
Anesthesia: Monitor Anesthesia Care | Laterality: Left

## 2017-05-02 NOTE — Telephone Encounter (Signed)
Call from patient's spouse that she was unable to make today's surgery due to N/V and diarrhea, and desire to reschedule. He feels need to reschedule in 1- 2 weeks to allow wife to fully recover. Instructed spouse to restart Plavix.

## 2017-05-04 ENCOUNTER — Other Ambulatory Visit: Payer: Self-pay | Admitting: *Deleted

## 2017-05-04 ENCOUNTER — Telehealth: Payer: Self-pay | Admitting: *Deleted

## 2017-05-04 NOTE — Telephone Encounter (Signed)
Surgery resched per patient request. Instructed husband. To be at [facility] admitting department at 9:30 am on 05/09/17 for surgery. NPO past MN night prior. Expect a call and follow the detailed instructions received from the [facility] pre-admission testing department. Hold Plavix starting today for procedure. Verbalized understanding.

## 2017-05-05 ENCOUNTER — Other Ambulatory Visit: Payer: Self-pay | Admitting: *Deleted

## 2017-05-06 ENCOUNTER — Encounter (HOSPITAL_COMMUNITY): Payer: Self-pay | Admitting: *Deleted

## 2017-05-06 ENCOUNTER — Other Ambulatory Visit: Payer: Self-pay

## 2017-05-06 NOTE — Progress Notes (Signed)
Pt denies any acute cardiopulmonary issues. Pt under the care of Dr. Lovena Le, Cardiology. Pt denies having a cardiac cath. Pt stated that she cannot remember exactly what day that she took her last dose of Plavix " I know that I didn't take it last night." Pt requested that Amy be provided with pre-op instructions. Amy stated that pt does not take medication for diabetes and does not check her blood glucose. Amy verbalized understanding of all pre-op instructions.

## 2017-05-09 ENCOUNTER — Ambulatory Visit (HOSPITAL_COMMUNITY)
Admission: RE | Admit: 2017-05-09 | Discharge: 2017-05-09 | Disposition: A | Discharge status: Home / Self Care | Payer: 59 | Source: Ambulatory Visit | Attending: Vascular Surgery | Admitting: Vascular Surgery

## 2017-05-09 ENCOUNTER — Encounter (HOSPITAL_COMMUNITY): Payer: Self-pay

## 2017-05-09 ENCOUNTER — Other Ambulatory Visit: Payer: Self-pay

## 2017-05-09 ENCOUNTER — Ambulatory Visit (HOSPITAL_COMMUNITY): Payer: 59 | Admitting: Certified Registered"

## 2017-05-09 ENCOUNTER — Encounter (HOSPITAL_COMMUNITY)
Admission: RE | Disposition: A | Discharge status: Home / Self Care | Payer: Self-pay | Source: Ambulatory Visit | Attending: Vascular Surgery

## 2017-05-09 DIAGNOSIS — Z794 Long term (current) use of insulin: Secondary | ICD-10-CM | POA: Diagnosis not present

## 2017-05-09 DIAGNOSIS — Z7951 Long term (current) use of inhaled steroids: Secondary | ICD-10-CM | POA: Diagnosis not present

## 2017-05-09 DIAGNOSIS — E1122 Type 2 diabetes mellitus with diabetic chronic kidney disease: Secondary | ICD-10-CM | POA: Diagnosis not present

## 2017-05-09 DIAGNOSIS — I509 Heart failure, unspecified: Secondary | ICD-10-CM | POA: Diagnosis not present

## 2017-05-09 DIAGNOSIS — Z6832 Body mass index (BMI) 32.0-32.9, adult: Secondary | ICD-10-CM | POA: Insufficient documentation

## 2017-05-09 DIAGNOSIS — Z7902 Long term (current) use of antithrombotics/antiplatelets: Secondary | ICD-10-CM | POA: Insufficient documentation

## 2017-05-09 DIAGNOSIS — I132 Hypertensive heart and chronic kidney disease with heart failure and with stage 5 chronic kidney disease, or end stage renal disease: Secondary | ICD-10-CM | POA: Diagnosis not present

## 2017-05-09 DIAGNOSIS — E1151 Type 2 diabetes mellitus with diabetic peripheral angiopathy without gangrene: Secondary | ICD-10-CM | POA: Diagnosis not present

## 2017-05-09 DIAGNOSIS — F329 Major depressive disorder, single episode, unspecified: Secondary | ICD-10-CM | POA: Diagnosis not present

## 2017-05-09 DIAGNOSIS — N185 Chronic kidney disease, stage 5: Secondary | ICD-10-CM | POA: Diagnosis not present

## 2017-05-09 DIAGNOSIS — Z8673 Personal history of transient ischemic attack (TIA), and cerebral infarction without residual deficits: Secondary | ICD-10-CM | POA: Insufficient documentation

## 2017-05-09 DIAGNOSIS — Y832 Surgical operation with anastomosis, bypass or graft as the cause of abnormal reaction of the patient, or of later complication, without mention of misadventure at the time of the procedure: Secondary | ICD-10-CM | POA: Insufficient documentation

## 2017-05-09 DIAGNOSIS — N186 End stage renal disease: Secondary | ICD-10-CM | POA: Insufficient documentation

## 2017-05-09 DIAGNOSIS — F419 Anxiety disorder, unspecified: Secondary | ICD-10-CM | POA: Insufficient documentation

## 2017-05-09 DIAGNOSIS — M797 Fibromyalgia: Secondary | ICD-10-CM | POA: Insufficient documentation

## 2017-05-09 DIAGNOSIS — Z87891 Personal history of nicotine dependence: Secondary | ICD-10-CM | POA: Diagnosis not present

## 2017-05-09 DIAGNOSIS — E669 Obesity, unspecified: Secondary | ICD-10-CM | POA: Insufficient documentation

## 2017-05-09 DIAGNOSIS — E782 Mixed hyperlipidemia: Secondary | ICD-10-CM | POA: Insufficient documentation

## 2017-05-09 DIAGNOSIS — D631 Anemia in chronic kidney disease: Secondary | ICD-10-CM | POA: Insufficient documentation

## 2017-05-09 DIAGNOSIS — T82868A Thrombosis of vascular prosthetic devices, implants and grafts, initial encounter: Secondary | ICD-10-CM | POA: Diagnosis not present

## 2017-05-09 DIAGNOSIS — I6523 Occlusion and stenosis of bilateral carotid arteries: Secondary | ICD-10-CM | POA: Insufficient documentation

## 2017-05-09 HISTORY — PX: AV FISTULA PLACEMENT: SHX1204

## 2017-05-09 LAB — POCT I-STAT 4, (NA,K, GLUC, HGB,HCT)
Glucose, Bld: 156 mg/dL — ABNORMAL HIGH (ref 65–99)
HCT: 29 % — ABNORMAL LOW (ref 36.0–46.0)
Hemoglobin: 9.9 g/dL — ABNORMAL LOW (ref 12.0–15.0)
Potassium: 4.8 mmol/L (ref 3.5–5.1)
Sodium: 137 mmol/L (ref 135–145)

## 2017-05-09 LAB — GLUCOSE, CAPILLARY
GLUCOSE-CAPILLARY: 128 mg/dL — AB (ref 65–99)
Glucose-Capillary: 112 mg/dL — ABNORMAL HIGH (ref 65–99)

## 2017-05-09 LAB — HEMOGLOBIN A1C
HEMOGLOBIN A1C: 4.5 % — AB (ref 4.8–5.6)
MEAN PLASMA GLUCOSE: 82.45 mg/dL

## 2017-05-09 SURGERY — INSERTION OF ARTERIOVENOUS (AV) GORE-TEX GRAFT ARM
Anesthesia: Monitor Anesthesia Care | Site: Arm Upper | Laterality: Left

## 2017-05-09 MED ORDER — PROPOFOL 10 MG/ML IV BOLUS
INTRAVENOUS | Status: DC | PRN
  Administered 2017-05-09 (×2): 20 mg via INTRAVENOUS

## 2017-05-09 MED ORDER — ONDANSETRON HCL 4 MG/2ML IJ SOLN
4.0000 mg | Freq: Once | INTRAMUSCULAR | Status: AC
  Administered 2017-05-09: 4 mg via INTRAVENOUS
  Filled 2017-05-09: qty 2

## 2017-05-09 MED ORDER — LIDOCAINE-EPINEPHRINE 0.5 %-1:200000 IJ SOLN
INTRAMUSCULAR | Status: AC
  Filled 2017-05-09: qty 1

## 2017-05-09 MED ORDER — CHLORHEXIDINE GLUCONATE 4 % EX LIQD: 60.0000 mL | Freq: Once | CUTANEOUS | Status: DC

## 2017-05-09 MED ORDER — ONDANSETRON HCL 4 MG/2ML IJ SOLN
INTRAMUSCULAR | Status: DC | PRN
  Administered 2017-05-09: 4 mg via INTRAVENOUS

## 2017-05-09 MED ORDER — SODIUM CHLORIDE 0.9 % IV SOLN
INTRAVENOUS | Status: DC | PRN
  Administered 2017-05-09: 500 mL

## 2017-05-09 MED ORDER — 0.9 % SODIUM CHLORIDE (POUR BTL) OPTIME
TOPICAL | Status: DC | PRN
  Administered 2017-05-09: 1000 mL

## 2017-05-09 MED ORDER — FENTANYL CITRATE (PF) 100 MCG/2ML IJ SOLN: 25.0000 ug | INTRAMUSCULAR | Status: DC | PRN

## 2017-05-09 MED ORDER — SODIUM CHLORIDE 0.9 % IV SOLN
INTRAVENOUS | Status: DC
  Administered 2017-05-09: 11:00:00 via INTRAVENOUS

## 2017-05-09 MED ORDER — FENTANYL CITRATE (PF) 100 MCG/2ML IJ SOLN
INTRAMUSCULAR | Status: AC
  Filled 2017-05-09: qty 2

## 2017-05-09 MED ORDER — FENTANYL CITRATE (PF) 250 MCG/5ML IJ SOLN
INTRAMUSCULAR | Status: DC | PRN
  Administered 2017-05-09: 50 ug via INTRAVENOUS

## 2017-05-09 MED ORDER — METOPROLOL TARTRATE 5 MG/5ML IV SOLN
INTRAVENOUS | Status: DC | PRN
  Administered 2017-05-09 (×2): 2.5 mg via INTRAVENOUS

## 2017-05-09 MED ORDER — FENTANYL CITRATE (PF) 250 MCG/5ML IJ SOLN
INTRAMUSCULAR | Status: AC
  Filled 2017-05-09: qty 5

## 2017-05-09 MED ORDER — PROPOFOL 500 MG/50ML IV EMUL
INTRAVENOUS | Status: DC | PRN
  Administered 2017-05-09: 100 ug/kg/min via INTRAVENOUS

## 2017-05-09 MED ORDER — LIDOCAINE-EPINEPHRINE 0.5 %-1:200000 IJ SOLN
INTRAMUSCULAR | Status: DC | PRN
  Administered 2017-05-09: 15 mL

## 2017-05-09 MED ORDER — CEFAZOLIN SODIUM-DEXTROSE 2-4 GM/100ML-% IV SOLN
INTRAVENOUS | Status: AC
  Filled 2017-05-09: qty 100

## 2017-05-09 MED ORDER — HYDROCODONE-ACETAMINOPHEN 5-325 MG PO TABS: 1.0000 | ORAL_TABLET | Freq: Four times a day (QID) | ORAL | 0 refills | Status: DC | PRN

## 2017-05-09 MED ORDER — PHENYLEPHRINE HCL 10 MG/ML IJ SOLN
INTRAVENOUS | Status: DC | PRN
  Administered 2017-05-09: 10 ug/min via INTRAVENOUS

## 2017-05-09 MED ORDER — FENTANYL CITRATE (PF) 100 MCG/2ML IJ SOLN
25.0000 ug | INTRAMUSCULAR | Status: DC | PRN
  Administered 2017-05-09: 50 ug via INTRAVENOUS

## 2017-05-09 MED ORDER — CEFAZOLIN SODIUM-DEXTROSE 2-4 GM/100ML-% IV SOLN
2.0000 g | INTRAVENOUS | Status: AC
  Administered 2017-05-09: 2 g via INTRAVENOUS

## 2017-05-09 SURGICAL SUPPLY — 37 items
ADH SKN CLS APL DERMABOND .7 (GAUZE/BANDAGES/DRESSINGS) ×1
ARMBAND PINK RESTRICT EXTREMIT (MISCELLANEOUS) ×6 IMPLANT
CANISTER SUCT 3000ML PPV (MISCELLANEOUS) ×3 IMPLANT
CANNULA VESSEL 3MM 2 BLNT TIP (CANNULA) ×3 IMPLANT
CLIP LIGATING EXTRA MED SLVR (CLIP) ×3 IMPLANT
CLIP LIGATING EXTRA SM BLUE (MISCELLANEOUS) ×3 IMPLANT
DECANTER SPIKE VIAL GLASS SM (MISCELLANEOUS) ×3 IMPLANT
DERMABOND ADVANCED (GAUZE/BANDAGES/DRESSINGS) ×2
DERMABOND ADVANCED .7 DNX12 (GAUZE/BANDAGES/DRESSINGS) ×1 IMPLANT
ELECT REM PT RETURN 9FT ADLT (ELECTROSURGICAL) ×3
ELECTRODE REM PT RTRN 9FT ADLT (ELECTROSURGICAL) ×1 IMPLANT
GAUZE SPONGE 4X4 12PLY STRL (GAUZE/BANDAGES/DRESSINGS) ×3 IMPLANT
GLOVE BIO SURGEON STRL SZ 6.5 (GLOVE) ×3 IMPLANT
GLOVE BIO SURGEON STRL SZ7 (GLOVE) ×2 IMPLANT
GLOVE BIO SURGEONS STRL SZ 6.5 (GLOVE) ×3
GLOVE BIOGEL PI IND STRL 6.5 (GLOVE) IMPLANT
GLOVE BIOGEL PI IND STRL 7.0 (GLOVE) IMPLANT
GLOVE BIOGEL PI INDICATOR 6.5 (GLOVE) ×4
GLOVE BIOGEL PI INDICATOR 7.0 (GLOVE) ×2
GLOVE SS BIOGEL STRL SZ 7.5 (GLOVE) ×1 IMPLANT
GLOVE SUPERSENSE BIOGEL SZ 7.5 (GLOVE) ×2
GOWN STRL REUS W/ TWL LRG LVL3 (GOWN DISPOSABLE) ×3 IMPLANT
GOWN STRL REUS W/TWL LRG LVL3 (GOWN DISPOSABLE) ×9
GRAFT GORETEX STRT 4-7X45 (Vascular Products) ×2 IMPLANT
KIT BASIN OR (CUSTOM PROCEDURE TRAY) ×3 IMPLANT
KIT ROOM TURNOVER OR (KITS) ×3 IMPLANT
NS IRRIG 1000ML POUR BTL (IV SOLUTION) ×3 IMPLANT
PACK CV ACCESS (CUSTOM PROCEDURE TRAY) ×3 IMPLANT
PAD ARMBOARD 7.5X6 YLW CONV (MISCELLANEOUS) ×6 IMPLANT
SUT PROLENE 6 0 CC (SUTURE) ×8 IMPLANT
SUT SILK 2 0 PERMA HAND 18 BK (SUTURE) IMPLANT
SUT VIC AB 3-0 SH 27 (SUTURE) ×3
SUT VIC AB 3-0 SH 27X BRD (SUTURE) ×2 IMPLANT
SYR TOOMEY 50ML (SYRINGE) IMPLANT
TOWEL GREEN STERILE (TOWEL DISPOSABLE) ×3 IMPLANT
UNDERPAD 30X30 (UNDERPADS AND DIAPERS) ×3 IMPLANT
WATER STERILE IRR 1000ML POUR (IV SOLUTION) ×3 IMPLANT

## 2017-05-09 NOTE — Op Note (Signed)
    OPERATIVE REPORT  DATE OF SURGERY: 05/09/2017  PATIENT: Kelli [name], 67 y.o. female MRN: [id number]  DOB: Jul 06, 1950  PRE-OPERATIVE DIAGNOSIS: End-stage renal disease  POST-OPERATIVE DIAGNOSIS:  Same  PROCEDURE: Left upper arm AV Gore-Tex graft, 4-7 mm tapered  SURGEON:  Curt Jews, M.D.  PHYSICIAN ASSISTANT: Nurse  ANESTHESIA: Local with sedation  EBL: Minimal ml  Total I/O In: 400 [I.V.:400] Out: 10 [Blood:10]  BLOOD ADMINISTERED: None  DRAINS: None  SPECIMEN: None  COUNTS CORRECT:  YES  PLAN OF CARE: PACU  PATIENT DISPOSITION:  PACU - hemodynamically stable  PROCEDURE DETAILS: The patient was taken to the operative place to position with the area of the left arm prepped draped usual sterile fashion.  An incision was made over the prior antecubital incision carried down to isolate the prior basilic vein to brachial artery anastomosis which had failed.  The brachial artery was exposed proximal distal to the old anastomosis.  Next a separate incision was made in the axilla over the axillary vein.  The vein was of large caliber.  A tunnel was created from the level of the antecubital space to the axillary vein and a 47 mm Gore-Tex graft was brought through the tunnel.  The brachial artery was occluded proximal distal to the old anastomosis.  The old anastomosis was taken down and the 4 mm portion of the graft was resected and spatulated to leave the 5 mm anastomosis.  This was sewn end-to-side to the artery with a running 6-0 Prolene suture.  The anastomosis tested and found to be adequate.  The graft was flushed with heparinized saline and reoccluded.  Axillary vein was occluded proximally and distally and was opened with an 11 blade and extended longitudinally with Potts scissors.  The graft cut to the appropriate length and spatulated and sewn end-to-side to the axillary vein with a running 6-0 Prolene suture.  Clamps removed and excellent thrill was noted.  The wounds  were irrigated with saline.  Hemostasis with cautery.  The wounds were closed with 3-0 Vicryl in the subcutaneous and subcuticular tissue.  Sterile dressing was applied and the patient was transferred to the recovery room in stable condition   [name], M.D., [facility] 05/09/2017 2:00 PM

## 2017-05-09 NOTE — Anesthesia Procedure Notes (Signed)
Procedure Name: MAC Date/Time: 05/09/2017 12:47 PM Performed by: Imagene Riches, CRNA Pre-anesthesia Checklist: Patient identified, Emergency Drugs available, Suction available and Patient being monitored Patient Re-evaluated:Patient Re-evaluated prior to induction Oxygen Delivery Method: Simple face mask Preoxygenation: Pre-oxygenation with 100% oxygen Induction Type: IV induction

## 2017-05-09 NOTE — Interval H&P Note (Signed)
History and Physical Interval Note:  05/09/2017 11:59 AM  Kelli [name]  has presented today for surgery, with the diagnosis of END STAGE RENAL DISEASE FOR HEMODIALYSIS ACCESS  The various methods of treatment have been discussed with the patient and family. After consideration of risks, benefits and other options for treatment, the patient has consented to  Procedure(s): INSERTION OF ARTERIOVENOUS (AV) GORE-TEX GRAFT LEFT UPPER ARM (Left) as a surgical intervention .  The patient's history has been reviewed, patient examined, no change in status, stable for surgery.  I have reviewed the patient's chart and labs.  Questions were answered to the patient's satisfaction.     Curt Jews

## 2017-05-09 NOTE — Discharge Instructions (Signed)
° °  [facility] ° °Discharge Instructions ° °AV Fistula or Graft Surgery for Dialysis Access ° °Please refer to the following instructions for your post-procedure care. Your surgeon or physician assistant will discuss any changes with you. ° °Activity ° °You may drive the day following your surgery, if you are comfortable and no longer taking prescription pain medication. Resume full activity as the soreness in your incision resolves. ° °Bathing/Showering ° °You may shower after you go home. Keep your incision dry for 48 hours. Do not soak in a bathtub, hot tub, or swim until the incision heals completely. You may not shower if you have a hemodialysis catheter. ° °Incision Care ° °Clean your incision with mild soap and water after 48 hours. Pat the area dry with a clean towel. You do not need a bandage unless otherwise instructed. Do not apply any ointments or creams to your incision. You may have skin glue on your incision. Do not peel it off. It will come off on its own in about one week. Your arm may swell a bit after surgery. To reduce swelling use pillows to elevate your arm so it is above your heart. Your doctor will tell you if you need to lightly wrap your arm with an ACE bandage. ° °Diet ° °Resume your normal diet. There are not special food restrictions following this procedure. In order to heal from your surgery, it is CRITICAL to get adequate nutrition. Your body requires vitamins, minerals, and protein. Vegetables are the best source of vitamins and minerals. Vegetables also provide the perfect balance of protein. Processed food has little nutritional value, so try to avoid this. ° °Medications ° °Resume taking all of your medications. If your incision is causing pain, you may take over-the counter pain relievers such as acetaminophen (Tylenol). If you were prescribed a stronger pain medication, please be aware these medications can cause nausea and constipation. Prevent  nausea by taking the medication with a snack or meal. Avoid constipation by drinking plenty of fluids and eating foods with high amount of fiber, such as fruits, vegetables, and grains. Do not take Tylenol if you are taking prescription pain medications. ° ° ° ° °Follow up °Your surgeon may want to see you in the office following your access surgery. If so, this will be arranged at the time of your surgery. ° °Please call us immediately for any of the following conditions: ° °Increased pain, redness, drainage (pus) from your incision site °Fever of 101 degrees or higher °Severe or worsening pain at your incision site °Hand pain or numbness. ° °Reduce your risk of vascular disease: ° °Stop smoking. If you would like help, call [facility] at 1-800-QUIT-NOW (1-800-784-8669) or [facility] at 336-586-4000 ° °Manage your cholesterol °Maintain a desired weight °Control your diabetes °Keep your blood pressure down ° °Dialysis ° °It will take several weeks to several months for your new dialysis access to be ready for use. Your surgeon will determine when it is OK to use it. Your nephrologist will continue to direct your dialysis. You can continue to use your Permcath until your new access is ready for use. ° °If you have any questions, please call the office at [phone]. ° °

## 2017-05-09 NOTE — Transfer of Care (Signed)
Immediate Anesthesia Transfer of Care Note  Patient: Kelli [name]  Procedure(s) Performed: INSERTION OF ARTERIOVENOUS (AV) GORE-TEX GRAFT LEFT UPPER ARM (Left Arm Upper)  Patient Location: PACU  Anesthesia Type:MAC  Level of Consciousness: awake, alert  and oriented  Airway & Oxygen Therapy: Patient Spontanous Breathing and Patient connected to face mask oxygen  Post-op Assessment: Report given to RN and Post -op Vital signs reviewed and stable  Post vital signs: Reviewed and stable  Last Vitals:  Vitals:   05/09/17 0944 05/09/17 1347  BP: (!) 203/61 (!) 163/66  Pulse: 83 93  Resp: 20 16  Temp: 36.8 C   SpO2: 97% 100%    Last Pain:  Vitals:   05/09/17 0944  TempSrc: Oral      Patients Stated Pain Goal: 3 (15/17/61 6073)  Complications: No apparent anesthesia complications

## 2017-05-09 NOTE — Anesthesia Postprocedure Evaluation (Signed)
Anesthesia Post Note  Patient: Kelli [name]  Procedure(s) Performed: INSERTION OF ARTERIOVENOUS (AV) GORE-TEX GRAFT LEFT UPPER ARM (Left Arm Upper)     Patient location during evaluation: PACU Anesthesia Type: MAC Level of consciousness: awake and alert and oriented Pain management: pain level controlled Vital Signs Assessment: post-procedure vital signs reviewed and stable Respiratory status: spontaneous breathing, nonlabored ventilation and respiratory function stable Cardiovascular status: stable and blood pressure returned to baseline Postop Assessment: no apparent nausea or vomiting Anesthetic complications: no    Last Vitals:  Vitals:   05/09/17 1432 05/09/17 1447  BP: (!) 157/62 (!) 149/89  Pulse: 93 96  Resp: 18 16  Temp:  36.6 C  SpO2: 92% 94%    Last Pain:  Vitals:   05/09/17 1447  TempSrc:   PainSc: 2                  Patrica Mendell A.

## 2017-05-09 NOTE — Anesthesia Preprocedure Evaluation (Addendum)
Anesthesia Evaluation  Patient identified by MRN, date of birth, ID band Patient awake    Reviewed: Allergy & Precautions, H&P , NPO status , [name] Chart, lab work & pertinent test results, reviewed documented beta blocker date and time   Airway Mallampati: II  TM Distance: >3 FB Neck ROM: Full    Dental  (+) Dental Advisory Given   Pulmonary pneumonia, resolved, former smoker,    breath sounds clear to auscultation- rhonchi + decreased breath sounds      Cardiovascular hypertension, Pt. on medications and Pt. on home beta blockers + Peripheral Vascular Disease and +CHF  + pacemaker  Rhythm:Regular Rate:Normal  Carotid US - Right ICA chronic occlusion, left side s/p CEA under 50% occluded   Neuro/Psych PSYCHIATRIC DISORDERS Anxiety Depression Left UE weakness TIA Neuromuscular disease CVA, Residual Symptoms    GI/Hepatic Neg liver ROS, PUD,   Endo/Other  diabetes, Well Controlled, Type 2, Insulin Dependent, Oral Hypoglycemic Agents  Renal/GU ESRF and DialysisRenal disease  negative genitourinary   Musculoskeletal  (+) Fibromyalgia -  Abdominal (+) + obese,   Peds  Hematology  (+) anemia ,   Anesthesia Other Findings   Reproductive/Obstetrics                           Anesthesia Physical  Anesthesia Plan  ASA: IV  Anesthesia Plan: MAC   Post-op Pain Management:    Induction: Intravenous  PONV Risk Score and Plan: 2 and Propofol infusion and Treatment may vary due to age or medical condition  Airway Management Planned: Natural Airway and Simple Face Mask  Additional Equipment: None  Intra-op Plan:   Post-operative Plan: Extubation in OR  Informed Consent: I have reviewed the patients History and Physical, chart, labs and discussed the procedure including the risks, benefits and alternatives for the proposed anesthesia with the patient or authorized representative who has  indicated his/her understanding and acceptance.   Dental advisory given  Plan Discussed with: CRNA, Anesthesiologist and Surgeon  Anesthesia Plan Comments:        Anesthesia Quick Evaluation

## 2017-05-10 ENCOUNTER — Encounter (HOSPITAL_COMMUNITY): Payer: Self-pay | Admitting: Vascular Surgery

## 2017-05-12 ENCOUNTER — Other Ambulatory Visit (HOSPITAL_COMMUNITY): Payer: Self-pay | Admitting: Nephrology

## 2017-05-12 DIAGNOSIS — T8241XA Breakdown (mechanical) of vascular dialysis catheter, initial encounter: Secondary | ICD-10-CM

## 2017-05-13 ENCOUNTER — Encounter (HOSPITAL_COMMUNITY): Payer: Self-pay

## 2017-05-13 ENCOUNTER — Ambulatory Visit (HOSPITAL_COMMUNITY)
Admission: RE | Admit: 2017-05-13 | Discharge: 2017-05-13 | Disposition: A | Discharge status: Home / Self Care | Payer: 59 | Source: Ambulatory Visit | Attending: Nephrology | Admitting: Nephrology

## 2017-05-13 DIAGNOSIS — M797 Fibromyalgia: Secondary | ICD-10-CM | POA: Insufficient documentation

## 2017-05-13 DIAGNOSIS — Z8673 Personal history of transient ischemic attack (TIA), and cerebral infarction without residual deficits: Secondary | ICD-10-CM | POA: Diagnosis not present

## 2017-05-13 DIAGNOSIS — I130 Hypertensive heart and chronic kidney disease with heart failure and stage 1 through stage 4 chronic kidney disease, or unspecified chronic kidney disease: Secondary | ICD-10-CM | POA: Insufficient documentation

## 2017-05-13 DIAGNOSIS — Z8249 Family history of ischemic heart disease and other diseases of the circulatory system: Secondary | ICD-10-CM | POA: Diagnosis not present

## 2017-05-13 DIAGNOSIS — T8241XA Breakdown (mechanical) of vascular dialysis catheter, initial encounter: Secondary | ICD-10-CM | POA: Insufficient documentation

## 2017-05-13 DIAGNOSIS — E1122 Type 2 diabetes mellitus with diabetic chronic kidney disease: Secondary | ICD-10-CM | POA: Insufficient documentation

## 2017-05-13 DIAGNOSIS — Z7951 Long term (current) use of inhaled steroids: Secondary | ICD-10-CM | POA: Diagnosis not present

## 2017-05-13 DIAGNOSIS — F419 Anxiety disorder, unspecified: Secondary | ICD-10-CM | POA: Insufficient documentation

## 2017-05-13 DIAGNOSIS — Y832 Surgical operation with anastomosis, bypass or graft as the cause of abnormal reaction of the patient, or of later complication, without mention of misadventure at the time of the procedure: Secondary | ICD-10-CM | POA: Insufficient documentation

## 2017-05-13 DIAGNOSIS — Z7902 Long term (current) use of antithrombotics/antiplatelets: Secondary | ICD-10-CM | POA: Insufficient documentation

## 2017-05-13 DIAGNOSIS — N183 Chronic kidney disease, stage 3 (moderate): Secondary | ICD-10-CM | POA: Diagnosis not present

## 2017-05-13 DIAGNOSIS — T8249XA Other complication of vascular dialysis catheter, initial encounter: Secondary | ICD-10-CM | POA: Diagnosis not present

## 2017-05-13 DIAGNOSIS — F329 Major depressive disorder, single episode, unspecified: Secondary | ICD-10-CM | POA: Diagnosis not present

## 2017-05-13 DIAGNOSIS — Z794 Long term (current) use of insulin: Secondary | ICD-10-CM | POA: Insufficient documentation

## 2017-05-13 DIAGNOSIS — E782 Mixed hyperlipidemia: Secondary | ICD-10-CM | POA: Diagnosis not present

## 2017-05-13 DIAGNOSIS — I6521 Occlusion and stenosis of right carotid artery: Secondary | ICD-10-CM | POA: Diagnosis not present

## 2017-05-13 DIAGNOSIS — I509 Heart failure, unspecified: Secondary | ICD-10-CM | POA: Diagnosis not present

## 2017-05-13 DIAGNOSIS — Z87891 Personal history of nicotine dependence: Secondary | ICD-10-CM | POA: Diagnosis not present

## 2017-05-13 HISTORY — PX: IR FLUORO GUIDE CV LINE LEFT: IMG2282

## 2017-05-13 MED ORDER — SODIUM CHLORIDE 0.9 % IV SOLN: INTRAVENOUS | Status: DC

## 2017-05-13 MED ORDER — HEPARIN SODIUM (PORCINE) 1000 UNIT/ML IJ SOLN
INTRAMUSCULAR | Status: AC
  Filled 2017-05-13: qty 1

## 2017-05-13 MED ORDER — CHLORHEXIDINE GLUCONATE 4 % EX LIQD
CUTANEOUS | Status: AC
  Filled 2017-05-13: qty 15

## 2017-05-13 MED ORDER — CEFAZOLIN SODIUM-DEXTROSE 2-4 GM/100ML-% IV SOLN
INTRAVENOUS | Status: AC
  Administered 2017-05-13: 2 g
  Filled 2017-05-13: qty 100

## 2017-05-13 MED ORDER — CEFAZOLIN SODIUM-DEXTROSE 2-4 GM/100ML-% IV SOLN: 2.0000 g | INTRAVENOUS | Status: DC

## 2017-05-13 MED ORDER — HEPARIN SODIUM (PORCINE) 1000 UNIT/ML IJ SOLN
INTRAMUSCULAR | Status: DC | PRN
  Administered 2017-05-13: 4.2 mL

## 2017-05-13 MED ORDER — CEFAZOLIN SODIUM-DEXTROSE 2-4 GM/100ML-% IV SOLN: 2.0000 g | Freq: Three times a day (TID) | INTRAVENOUS | Status: DC

## 2017-05-13 MED ORDER — LIDOCAINE HCL 1 % IJ SOLN
INTRAMUSCULAR | Status: DC | PRN
  Administered 2017-05-13: 5 mL

## 2017-05-13 MED ORDER — LIDOCAINE HCL 1 % IJ SOLN
INTRAMUSCULAR | Status: AC
  Filled 2017-05-13: qty 20

## 2017-05-13 NOTE — H&P (Signed)
Chief Complaint: Patient was seen in consultation today for HD catheter exchange at the request of [place]  Referring Physician(s): Fran Lowes  Supervising Physician: Aletta Edouard  Patient Status: [facility] - Out-pt  History of Present Illness: Kelli [name] is a 67 y.o. female   Lt IJ catheter just exchanged 04/25/17 in IR Last use yesterday--- unable to complete dialysis--maybe 30 minutes use.  Dr Early note 05/09/2017: Assessment/Plan:  This is a 66 y.o. female who is s/p: Left 1st stage basilic vein transposition on 04/15/17 by Dr. Trula Slade who returns today due to no thrill or bruit in new left 1st stage BVT  -at the time of surgery, the left basilic vein was marginal as Dr. Trula Slade felt the vein looked better on u/s than it did on exposure.  Pre-operatively, he had discussed with her & her husband the possibility of needed a graft.  Since the vein was marginal and it has only been a few days, will proceed with LUA AVG.  She will need a M/W/F since she dialyzes in [place] on T/T/S and she & her husband prefer later in the morning if possible.   -she currently is dialyzing via left IJ [facility] Dt that was placed by IR in November 2018 -she is on Plavix and we will stop that 5 days prior to surgery as it was stopped 5 days prior to her fistula creation.   -discussed risks vs benefits and pt and husband wish to proceed  PROCEDURE: Left upper arm AV Gore-Tex graft, 4-7 mm tapered New fistula placed 05/09/17 in OR with Dr Donnetta Hutching  Still using L IJ HD cath--- Slow to no flow per dialysis center Scheduled now for exchange   Past Medical History:  Diagnosis Date  . Anemia   . Anxiety   . Carotid artery occlusion    Occluded RICA, status post left CEA  August 2014 - Dr. Donnetta Hutching  . Cerebral infarction [facility]) Aug 2014   Bihemispheric watershed infarcts  . Cerebral infarction involving left cerebellar artery [facility]) Feb 2015  . CHF (congestive heart failure) ([place])   . CKD (chronic  kidney disease) stage 3, GFR 30-59 ml/min (HCC)    dialysis T/Th/sa  . Closed dislocation of left humerus 07/26/2013  . Depression   . DM (diabetes mellitus), type 2 ([place])   . Essential hypertension, benign   . Fibromyalgia   . Mixed hyperlipidemia   . Multiple gastric ulcers   . Pneumonia   . Stroke [facility])    pt states she cannot walk, left arm weakness  . Urinary incontinence     Past Surgical History:  Procedure Laterality Date  . AV FISTULA PLACEMENT Left 04/15/2017   Procedure: ARTERIOVENOUS (AV) FISTULA CREATION LEFT ARM;  Surgeon: Serafina Mitchell, MD;  [facility] OR;  Service: Vascular;  Laterality: Left;  . AV FISTULA PLACEMENT Left 05/09/2017   Procedure: INSERTION OF ARTERIOVENOUS (AV) GORE-TEX GRAFT LEFT UPPER ARM;  Surgeon: [name], MD;  Location: [place];  Service: Vascular;  Laterality: Left;  . BIOPSY  01/22/2017   Procedure: BIOPSY;  Surgeon: Danie Binder, MD;  [facility];  Service: Endoscopy;;  gastric  . COMBINED HYSTERECTOMY VAGINAL W/ MMK / A&P REPAIR  1981  . ENDARTERECTOMY Left 10/06/2012   Procedure: Carotid Endarterectomy with Finesse patch angioplasty;  Surgeon: [name], MD;  Location: [place];  Service: Vascular;  Laterality: Left;  . ESOPHAGOGASTRODUODENOSCOPY N/A 04/26/2014   Procedure: ESOPHAGOGASTRODUODENOSCOPY (EGD);  Surgeon: Lear Ng, MD;  [facility] ENDOSCOPY;  Service: Endoscopy;  Laterality: N/A;  . ESOPHAGOGASTRODUODENOSCOPY N/A 01/22/2017   Procedure: ESOPHAGOGASTRODUODENOSCOPY (EGD);  Surgeon: Danie Binder, MD;  [facility];  Service: Endoscopy;  Laterality: N/A;  . FLEXIBLE SIGMOIDOSCOPY N/A 01/22/2017   Procedure: FLEXIBLE SIGMOIDOSCOPY;  Surgeon: Danie Binder, MD;  [facility];  Service: Endoscopy;  Laterality: N/A;  . IR CV LINE INJECTION  02/04/2017  . IR FLUORO GUIDE CV LINE LEFT  01/14/2017  . IR FLUORO GUIDE CV LINE LEFT  04/25/2017  . IR US GUIDE VASC ACCESS LEFT  01/14/2017    . LOOP RECORDER IMPLANT  04/16/13   MDT LinQ implanted for cryptogenic stroke  . [name] WITHOUT CARDIOVERSION N/A 04/16/2013   Procedure: TRANSESOPHAGEAL ECHOCARDIOGRAM ([name]);  Surgeon: Josue Hector, MD;  Location: [facility] ENDOSCOPY;  Service: Cardiovascular;  Laterality: N/A;  . URETHRAL DILATION  1980's  . VAGINAL HYSTERECTOMY  1981   "partial" (10/04/2012)    Allergies: Hydrochlorothiazide  Medications: Prior to Admission medications   Medication Sig Start Date End Date Taking? Authorizing Provider  amLODipine (NORVASC) 10 MG tablet Take 10 mg by mouth daily.    [provider]  atorvastatin (LIPITOR) 40 MG tablet Take 1 tablet (40 mg total) by mouth daily at 6 PM. 10/20/16   Cherene Altes, MD  budesonide (PULMICORT) 0.5 MG/2ML nebulizer solution Take 2 mLs (0.5 mg total) by nebulization 2 (two) times daily. Patient not taking: Reported on 04/21/2017 01/27/17   Kathie Dike, MD  calcitRIOL (ROCALTROL) 0.25 MCG capsule Take 1 capsule (0.25 mcg total) by mouth daily. 01/30/17   Kathie Dike, MD  clopidogrel (PLAVIX) 75 MG tablet Take 1 tablet (75 mg total) by mouth daily. 01/31/17   Kathie Dike, MD  epoetin alfa (EPOGEN,PROCRIT) 88502 UNIT/ML injection Inject 1 mL (10,000 Units total) into the vein Every Tuesday,Thursday,and Saturday with dialysis. 01/29/17   Kathie Dike, MD  guaiFENesin-dextromethorphan (ROBITUSSIN DM) 100-10 MG/5ML syrup Take 5 mLs by mouth every 4 (four) hours as needed for cough. 01/27/17   Kathie Dike, MD  hydrALAZINE (APRESOLINE) 50 MG tablet Take 1 tablet (50 mg total) by mouth 3 (three) times daily. 10/20/16   Cherene Altes, MD  HYDROcodone-acetaminophen (NORCO) 5-325 MG tablet Take 1 tablet by mouth every 6 (six) hours as needed for moderate pain. 05/09/17   Ulyses Amor, PA-C  insulin aspart (NOVOLOG) 100 UNIT/ML injection Inject 0-15 Units into the skin 3 (three) times daily with meals. Patient not taking: Reported on 04/21/2017 01/19/17    Reyne Dumas, MD  ipratropium-albuterol (DUONEB) 0.5-2.5 (3) MG/3ML SOLN Take 3 mLs by nebulization every 6 (six) hours as needed. Patient not taking: Reported on 04/21/2017 01/19/17   Reyne Dumas, MD  loratadine (CLARITIN) 10 MG tablet Take 1 tablet (10 mg total) by mouth daily. Patient taking differently: Take 10 mg by mouth daily as needed for allergies.  01/20/17   Reyne Dumas, MD  metoprolol succinate (TOPROL-XL) 100 MG 24 hr tablet Take 100 mg by mouth daily. 04/05/17   [provider]  pantoprazole (PROTONIX) 40 MG tablet Take 1 tablet (40 mg total) by mouth 2 (two) times daily before a meal. 01/27/17   Kathie Dike, MD  potassium chloride SA (K-DUR,KLOR-CON) 20 MEQ tablet Take 20 mEq by mouth 2 (two) times daily. 04/05/17   [provider]  sodium bicarbonate 650 MG tablet Take 650 mg by mouth 3 (three) times daily.     [provider]  torsemide [facility])  20 MG tablet Take 20 mg by mouth daily.    [provider]  traMADol (ULTRAM) 50 MG tablet Take 1 tablet (50 mg total) by mouth every 12 (twelve) hours as needed for moderate pain. Patient not taking: Reported on 04/21/2017 01/19/17   Reyne Dumas, MD  triamcinolone cream (KENALOG) 0.1 % Apply 1 application topically 3 (three) times daily as needed (for rash).     [provider]  venlafaxine XR (EFFEXOR-XR) 150 MG 24 hr capsule Take 150 mg by mouth daily with breakfast.    [provider]     Family History  Problem Relation Age of Onset  . Diabetes Mother   . Diabetes Father   . Hypertension Sister   . Diabetes Brother   . Seizures Son   . Kidney disease Maternal Grandmother   . Hypertension Maternal Grandmother   . Heart disease Maternal Grandfather   . Diabetes Paternal Grandmother   . Heart disease Paternal Grandfather     Social History   Socioeconomic History  . Marital status: Married    Spouse name: None  . Number of children: None  . Years of education:  None  . Highest education level: None  Social Needs  . Financial resource strain: None  . Food insecurity - worry: None  . Food insecurity - inability: None  . Transportation needs - medical: None  . Transportation needs - non-medical: None  Occupational History  . None  Tobacco Use  . Smoking status: Former Smoker    Packs/day: 1.00    Years: 44.00    Pack years: 44.00    Types: Cigarettes    Last attempt to quit: 10/03/2012    Years since quitting: 4.6  . Smokeless tobacco: Never Used  Substance and Sexual Activity  . Alcohol use: No    Alcohol/week: 0.0 oz  . Drug use: No  . Sexual activity: Yes    Birth control/protection: Surgical  Other Topics Concern  . 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    Review of Systems: A 12 point ROS discussed and pertinent positives are indicated in the HPI above.  All other systems are negative.  Review of Systems  Constitutional: Positive for activity change and fatigue. Negative for fever.  Respiratory: Negative for cough and shortness of breath.   Gastrointestinal: Negative for abdominal pain.  Neurological: Positive for weakness.  Psychiatric/Behavioral: Negative for behavioral problems and confusion.    Vital Signs: BP (!) 158/53 (BP Location: Right Arm)   Pulse 67   SpO2 96%   Physical Exam  Cardiovascular: Normal rate, regular rhythm and normal heart sounds.  Pulmonary/Chest: Effort normal and breath sounds normal.  Abdominal: Soft. Bowel sounds are normal.  Musculoskeletal: Normal range of motion.  Neurological: She is alert.  Skin: Skin is warm and dry.  Psychiatric: She has a normal mood and affect. Her behavior is normal. Judgment and thought content normal.  Nursing note and vitals reviewed.   Imaging: Ir Fluoro Guide Cv Line Left  Result Date: 04/25/2017 INDICATION: 67 year old female with left IJ hemodialysis  catheter nonfunctional EXAM: IMAGE GUIDED EXCHANGE OF LEFT INTERNAL JUGULAR TUNNELED HEMODIALYSIS CATHETER MEDICATIONS: NONE ANESTHESIA/SEDATION: None FLUOROSCOPY TIME:  Fluoroscopy Time: 0 minutes 18 seconds (1.1 mGy). COMPLICATIONS: None PROCEDURE: Informed written consent was obtained from the patient after a discussion of the risks, benefits, and alternatives to treatment. Questions regarding the procedure were encouraged  and answered. The left neck and chest were prepped with chlorhexidine in a sterile fashion, and a sterile drape was applied covering the operative field. Maximum barrier sterile technique with sterile gowns and gloves were used for the procedure. A timeout was performed prior to the initiation of the procedure. After the patient was prepped and draped in the usual sterile fashion, 1% lidocaine was used for local anesthesia. Scout image was acquired. Blunt dissection was used to free the cuff from the catheter tract. Glidewire was advanced into the inferior vena cava and the catheter was removed over the wire. A new 23 cm tip to cuff catheter was placed over the Glidewire. Once the catheter was in position, Glidewire was removed. Catheter lumen were aspirated, flushed with saline, confirmed with aspiration inflow. Heparin dwell was infused. Patient tolerated the procedure well and remained hemodynamically stable throughout. No complications were encountered and no significant blood loss. Final image was acquired. IMPRESSION: Status post routine exchange of left IJ tunneled hemodialysis catheter with placement of a new 23 cm tip to cuff catheter. Signed, Dulcy Fanny. Earleen Newport, DO [facility] Electronically Signed   By: Corrie Mckusick D.O.   On: 04/25/2017 12:12    Labs:  CBC: Recent Labs    01/26/17 0613 01/27/17 0452 01/27/17 2340 01/30/17 0607 02/18/17 1618 04/15/17 1211 05/09/17 1021  WBC 6.4 7.5 6.8 5.1  --   --   --   HGB  7.9* 8.0* 8.7* 8.5* 6.5* 10.2* 9.9*  HCT 25.4* 25.8* 27.6* 27.2* 19.0* 30.0* 29.0*  PLT 141* 159 179 246  --   --   --     COAGS: Recent Labs    01/13/17 1142  INR 1.05  APTT 25    BMP: Recent Labs    01/26/17 0613 01/27/17 0452 01/27/17 2340 01/30/17 0607 02/18/17 1618 04/15/17 1211 05/09/17 1021  NA 138 137 135 137 138 136 137  K 3.7 4.1 4.1 3.7 3.5 4.2 4.8  CL 102 101 98* 99* 98*  --   --   CO2 27 25 27 28   --   --   --   GLUCOSE 142* 129* 204* 122* 134* 139* 156*  BUN 50* 63* 35* 25* 15  --   --   CALCIUM 6.8* 6.7* 7.0* 7.4*  --   --   --   CREATININE 3.78* 4.55* 3.15* 2.91* 3.50*  --   --   GFRNONAA 11* 9* 14* 16*  --   --   --   GFRAA 13* 11* 17* 18*  --   --   --     LIVER FUNCTION TESTS: Recent Labs    10/13/16 0352  10/19/16 1105  01/25/17 0555 01/25/17 1533 01/26/17 0613 01/27/17 2340  BILITOT 0.4  --  0.4  --   --   --   --  0.5  AST 13*  --  14*  --   --   --   --  16  ALT 11*  --  14  --   --   --   --  10*  ALKPHOS 68  --  59  --   --   --   --  70  PROT 6.9  --  6.6  --   --   --   --  5.8*  ALBUMIN 3.3*   < > 3.0*  3.0*   < > 2.7* 2.7* 2.5* 2.9*   < > = values in this  interval not displayed.    TUMOR MARKERS: No results for input(s): AFPTM, CEA, CA199, CHROMGRNA in the last 8760 hours.  Assessment and Plan:  Dialysis catheter for exchange Last exchange in IR 04/25/17 Last use - yesterday-- unable to complete---only 30 min use New LUA fistuala just placed 3/11 by Dr Donnetta Hutching. Risks and benefits including but not limited to: Infection; bleeding; vessel damage Agreeable to proceed Consent signed andin chart  Thank you for this interesting consult.  I greatly enjoyed meeting Kelli [name] and look forward to participating in their care.  A copy of this report was sent to the requesting provider on this date.  Electronically Signed: Lavonia Drafts, PA-C 05/13/2017, 2:03 PM   I spent a total of  30 Minutes   in face to face in clinical  consultation, greater than 50% of which was counseling/coordinating care for HD catheter exchange

## 2017-05-13 NOTE — Procedures (Signed)
Interventional Radiology Procedure Note  Procedure: HD catheter exchange  Complications: None  Estimated Blood Loss: < 10 mL  Findings: New 27 cm tip to cuff length Palindrome catheter exchanged over wire.  Tip in RA.  OK to use.  Venetia Night. Kathlene Cote, M.D Pager:  [phone]

## 2017-05-24 DIAGNOSIS — Z992 Dependence on renal dialysis: Secondary | ICD-10-CM | POA: Diagnosis not present

## 2017-05-24 DIAGNOSIS — E119 Type 2 diabetes mellitus without complications: Secondary | ICD-10-CM | POA: Diagnosis not present

## 2017-05-30 ENCOUNTER — Encounter: Payer: 59 | Admitting: Surgery

## 2017-05-30 ENCOUNTER — Encounter (HOSPITAL_COMMUNITY): Payer: 59

## 2017-06-27 ENCOUNTER — Encounter: Payer: Self-pay | Admitting: *Deleted

## 2017-06-27 ENCOUNTER — Other Ambulatory Visit: Payer: Self-pay | Admitting: *Deleted

## 2017-06-27 NOTE — Progress Notes (Signed)
Date and time of [facility] removal given to patient's spouse and will fax all other instructions to [facility] as requested.

## 2017-07-08 ENCOUNTER — Encounter (HOSPITAL_COMMUNITY): Payer: 59

## 2017-07-08 ENCOUNTER — Ambulatory Visit (HOSPITAL_COMMUNITY)
Admission: RE | Admit: 2017-07-08 | Discharge: 2017-07-08 | Disposition: A | Discharge status: Home / Self Care | Payer: 59 | Source: Ambulatory Visit | Attending: Nephrology | Admitting: Nephrology

## 2017-07-08 ENCOUNTER — Encounter (HOSPITAL_COMMUNITY): Payer: Self-pay | Admitting: Interventional Radiology

## 2017-07-08 ENCOUNTER — Other Ambulatory Visit (HOSPITAL_COMMUNITY): Payer: Self-pay | Admitting: Nephrology

## 2017-07-08 DIAGNOSIS — Z4901 Encounter for fitting and adjustment of extracorporeal dialysis catheter: Secondary | ICD-10-CM | POA: Insufficient documentation

## 2017-07-08 DIAGNOSIS — N186 End stage renal disease: Secondary | ICD-10-CM

## 2017-07-08 HISTORY — PX: IR REMOVAL TUN CV CATH W/O FL: IMG2289

## 2017-07-08 MED ORDER — LIDOCAINE HCL (PF) 1 % IJ SOLN
INTRAMUSCULAR | Status: DC | PRN
  Administered 2017-07-08: 2 mL

## 2017-07-08 MED ORDER — CHLORHEXIDINE GLUCONATE 4 % EX LIQD
CUTANEOUS | Status: AC
  Filled 2017-07-08: qty 15

## 2017-07-08 MED ORDER — CHLORHEXIDINE GLUCONATE 4 % EX LIQD
CUTANEOUS | Status: DC | PRN
  Administered 2017-07-08: 1 via TOPICAL

## 2017-07-08 MED ORDER — LIDOCAINE HCL 1 % IJ SOLN
INTRAMUSCULAR | Status: AC
  Filled 2017-07-08: qty 20

## 2017-07-08 NOTE — Procedures (Signed)
Successful removal of tunneled HD catheter EBL: None No immediate complications.  Jay Daneille Desilva, MD Pager #: [phone]   

## 2017-07-24 ENCOUNTER — Ambulatory Visit (HOSPITAL_COMMUNITY): Payer: 59 | Admitting: Certified Registered Nurse Anesthetist

## 2017-07-24 ENCOUNTER — Encounter (HOSPITAL_COMMUNITY)
Admission: AD | Disposition: A | Discharge status: Home / Self Care | Payer: Self-pay | Source: Other Acute Inpatient Hospital | Attending: Vascular Surgery

## 2017-07-24 ENCOUNTER — Emergency Department (HOSPITAL_COMMUNITY): Admission: EM | Admit: 2017-07-24 | Payer: 59 | Source: Home / Self Care

## 2017-07-24 ENCOUNTER — Ambulatory Visit (HOSPITAL_COMMUNITY)
Admission: AD | Admit: 2017-07-24 | Discharge: 2017-07-24 | Disposition: A | Discharge status: Home / Self Care | Payer: 59 | Source: Other Acute Inpatient Hospital | Attending: Vascular Surgery | Admitting: Vascular Surgery

## 2017-07-24 ENCOUNTER — Other Ambulatory Visit: Payer: Self-pay

## 2017-07-24 DIAGNOSIS — Z87891 Personal history of nicotine dependence: Secondary | ICD-10-CM | POA: Insufficient documentation

## 2017-07-24 DIAGNOSIS — E782 Mixed hyperlipidemia: Secondary | ICD-10-CM | POA: Insufficient documentation

## 2017-07-24 DIAGNOSIS — Z8673 Personal history of transient ischemic attack (TIA), and cerebral infarction without residual deficits: Secondary | ICD-10-CM | POA: Diagnosis not present

## 2017-07-24 DIAGNOSIS — M797 Fibromyalgia: Secondary | ICD-10-CM | POA: Insufficient documentation

## 2017-07-24 DIAGNOSIS — T82868A Thrombosis of vascular prosthetic devices, implants and grafts, initial encounter: Secondary | ICD-10-CM | POA: Diagnosis not present

## 2017-07-24 DIAGNOSIS — Z794 Long term (current) use of insulin: Secondary | ICD-10-CM | POA: Diagnosis not present

## 2017-07-24 DIAGNOSIS — Z79899 Other long term (current) drug therapy: Secondary | ICD-10-CM | POA: Diagnosis not present

## 2017-07-24 DIAGNOSIS — Z8249 Family history of ischemic heart disease and other diseases of the circulatory system: Secondary | ICD-10-CM | POA: Diagnosis not present

## 2017-07-24 DIAGNOSIS — Z7902 Long term (current) use of antithrombotics/antiplatelets: Secondary | ICD-10-CM | POA: Insufficient documentation

## 2017-07-24 DIAGNOSIS — N189 Chronic kidney disease, unspecified: Secondary | ICD-10-CM | POA: Insufficient documentation

## 2017-07-24 DIAGNOSIS — Z8719 Personal history of other diseases of the digestive system: Secondary | ICD-10-CM | POA: Insufficient documentation

## 2017-07-24 DIAGNOSIS — I509 Heart failure, unspecified: Secondary | ICD-10-CM | POA: Insufficient documentation

## 2017-07-24 DIAGNOSIS — Y832 Surgical operation with anastomosis, bypass or graft as the cause of abnormal reaction of the patient, or of later complication, without mention of misadventure at the time of the procedure: Secondary | ICD-10-CM | POA: Insufficient documentation

## 2017-07-24 DIAGNOSIS — F419 Anxiety disorder, unspecified: Secondary | ICD-10-CM | POA: Diagnosis not present

## 2017-07-24 DIAGNOSIS — I130 Hypertensive heart and chronic kidney disease with heart failure and stage 1 through stage 4 chronic kidney disease, or unspecified chronic kidney disease: Secondary | ICD-10-CM | POA: Insufficient documentation

## 2017-07-24 DIAGNOSIS — I2510 Atherosclerotic heart disease of native coronary artery without angina pectoris: Secondary | ICD-10-CM | POA: Insufficient documentation

## 2017-07-24 DIAGNOSIS — N186 End stage renal disease: Secondary | ICD-10-CM | POA: Diagnosis not present

## 2017-07-24 DIAGNOSIS — E1122 Type 2 diabetes mellitus with diabetic chronic kidney disease: Secondary | ICD-10-CM | POA: Insufficient documentation

## 2017-07-24 DIAGNOSIS — Z992 Dependence on renal dialysis: Secondary | ICD-10-CM | POA: Diagnosis not present

## 2017-07-24 HISTORY — PX: THROMBECTOMY AND REVISION OF ARTERIOVENTOUS (AV) GORETEX  GRAFT: SHX6120

## 2017-07-24 LAB — POCT I-STAT 4, (NA,K, GLUC, HGB,HCT)
Glucose, Bld: 144 mg/dL — ABNORMAL HIGH (ref 65–99)
HCT: 33 % — ABNORMAL LOW (ref 36.0–46.0)
Hemoglobin: 11.2 g/dL — ABNORMAL LOW (ref 12.0–15.0)
Potassium: 4.3 mmol/L (ref 3.5–5.1)
Sodium: 140 mmol/L (ref 135–145)

## 2017-07-24 LAB — GLUCOSE, CAPILLARY: Glucose-Capillary: 124 mg/dL — ABNORMAL HIGH (ref 65–99)

## 2017-07-24 SURGERY — THROMBECTOMY AND REVISION OF ARTERIOVENTOUS (AV) GORETEX  GRAFT
Anesthesia: Monitor Anesthesia Care | Laterality: Left

## 2017-07-24 MED ORDER — ONDANSETRON HCL 4 MG/2ML IJ SOLN: 4.0000 mg | Freq: Once | INTRAMUSCULAR | Status: DC | PRN

## 2017-07-24 MED ORDER — LIDOCAINE-EPINEPHRINE (PF) 1 %-1:200000 IJ SOLN
INTRAMUSCULAR | Status: AC
  Filled 2017-07-24: qty 30

## 2017-07-24 MED ORDER — HEMOSTATIC AGENTS (NO CHARGE) OPTIME
TOPICAL | Status: DC | PRN
  Administered 2017-07-24: 1 via TOPICAL

## 2017-07-24 MED ORDER — HEPARIN SODIUM (PORCINE) 1000 UNIT/ML IJ SOLN
INTRAMUSCULAR | Status: DC | PRN
  Administered 2017-07-24: 3000 [IU] via INTRAVENOUS

## 2017-07-24 MED ORDER — SODIUM CHLORIDE 0.9 % IV SOLN
INTRAVENOUS | Status: AC
  Filled 2017-07-24: qty 1.2

## 2017-07-24 MED ORDER — LIDOCAINE-EPINEPHRINE (PF) 1 %-1:200000 IJ SOLN
INTRAMUSCULAR | Status: DC | PRN
  Administered 2017-07-24: 30 mL

## 2017-07-24 MED ORDER — SODIUM CHLORIDE 0.9 % IV SOLN
INTRAVENOUS | Status: DC | PRN
  Administered 2017-07-24: 07:00:00 via INTRAVENOUS

## 2017-07-24 MED ORDER — PHENYLEPHRINE 40 MCG/ML (10ML) SYRINGE FOR IV PUSH (FOR BLOOD PRESSURE SUPPORT)
PREFILLED_SYRINGE | INTRAVENOUS | Status: DC | PRN
  Administered 2017-07-24: 160 ug via INTRAVENOUS
  Administered 2017-07-24 (×2): 80 ug via INTRAVENOUS

## 2017-07-24 MED ORDER — PROPOFOL 1000 MG/100ML IV EMUL
INTRAVENOUS | Status: AC
  Filled 2017-07-24: qty 100

## 2017-07-24 MED ORDER — PHENYLEPHRINE HCL 10 MG/ML IJ SOLN
INTRAVENOUS | Status: DC | PRN
  Administered 2017-07-24: 50 ug/min via INTRAVENOUS

## 2017-07-24 MED ORDER — SODIUM CHLORIDE 0.9 % IV SOLN
INTRAVENOUS | Status: DC | PRN
  Administered 2017-07-24: 500 mL

## 2017-07-24 MED ORDER — FENTANYL CITRATE (PF) 100 MCG/2ML IJ SOLN
INTRAMUSCULAR | Status: DC | PRN
  Administered 2017-07-24: 25 ug via INTRAVENOUS

## 2017-07-24 MED ORDER — SODIUM CHLORIDE 0.9 % IJ SOLN
INTRAMUSCULAR | Status: AC
  Filled 2017-07-24: qty 30

## 2017-07-24 MED ORDER — METOPROLOL SUCCINATE ER 100 MG PO TB24: 100.0000 mg | ORAL_TABLET | Freq: Every day | ORAL | 0 refills | Status: AC

## 2017-07-24 MED ORDER — CEFAZOLIN SODIUM-DEXTROSE 2-3 GM-%(50ML) IV SOLR
INTRAVENOUS | Status: DC | PRN
  Administered 2017-07-24: 2 g via INTRAVENOUS

## 2017-07-24 MED ORDER — 0.9 % SODIUM CHLORIDE (POUR BTL) OPTIME
TOPICAL | Status: DC | PRN
  Administered 2017-07-24: 1000 mL

## 2017-07-24 MED ORDER — MEPERIDINE HCL 50 MG/ML IJ SOLN: 6.2500 mg | INTRAMUSCULAR | Status: DC | PRN

## 2017-07-24 MED ORDER — HYDROMORPHONE HCL 2 MG/ML IJ SOLN: 0.2500 mg | INTRAMUSCULAR | Status: DC | PRN

## 2017-07-24 MED ORDER — ONDANSETRON HCL 4 MG/2ML IJ SOLN
INTRAMUSCULAR | Status: AC
  Filled 2017-07-24: qty 2

## 2017-07-24 MED ORDER — FENTANYL CITRATE (PF) 250 MCG/5ML IJ SOLN
INTRAMUSCULAR | Status: AC
  Filled 2017-07-24: qty 5

## 2017-07-24 MED ORDER — EPHEDRINE SULFATE-NACL 50-0.9 MG/10ML-% IV SOSY
PREFILLED_SYRINGE | INTRAVENOUS | Status: DC | PRN
  Administered 2017-07-24 (×2): 10 mg via INTRAVENOUS

## 2017-07-24 MED ORDER — LIDOCAINE 2% (20 MG/ML) 5 ML SYRINGE
INTRAMUSCULAR | Status: DC | PRN
  Administered 2017-07-24: 20 mg via INTRAVENOUS

## 2017-07-24 MED ORDER — MIDAZOLAM HCL 2 MG/2ML IJ SOLN
INTRAMUSCULAR | Status: AC
  Filled 2017-07-24: qty 2

## 2017-07-24 MED ORDER — CEFAZOLIN SODIUM 1 G IJ SOLR
INTRAMUSCULAR | Status: AC
  Filled 2017-07-24: qty 20

## 2017-07-24 MED ORDER — HYDROCODONE-ACETAMINOPHEN 5-325 MG PO TABS: 1.0000 | ORAL_TABLET | Freq: Four times a day (QID) | ORAL | 0 refills | Status: DC | PRN

## 2017-07-24 MED ORDER — PROPOFOL 10 MG/ML IV BOLUS
INTRAVENOUS | Status: AC
  Filled 2017-07-24: qty 40

## 2017-07-24 MED ORDER — PROPOFOL 500 MG/50ML IV EMUL
INTRAVENOUS | Status: DC | PRN
  Administered 2017-07-24: 50 ug/kg/min via INTRAVENOUS

## 2017-07-24 MED ORDER — LIDOCAINE 2% (20 MG/ML) 5 ML SYRINGE
INTRAMUSCULAR | Status: AC
  Filled 2017-07-24: qty 5

## 2017-07-24 SURGICAL SUPPLY — 37 items
ADH SKN CLS APL DERMABOND .7 (GAUZE/BANDAGES/DRESSINGS) ×1
AGENT HMST SPONGE THK3/8 (HEMOSTASIS) ×1
ARMBAND PINK RESTRICT EXTREMIT (MISCELLANEOUS) ×4 IMPLANT
CANISTER SUCT 3000ML PPV (MISCELLANEOUS) ×3 IMPLANT
CATH EMB 3FR 40CM (CATHETERS) ×2 IMPLANT
CATH EMB 4FR 40CM (CATHETERS) ×2 IMPLANT
CATH EMB 4FR 80CM (CATHETERS) ×1 IMPLANT
CLIP VESOCCLUDE MED 6/CT (CLIP) ×3 IMPLANT
CLIP VESOCCLUDE SM WIDE 6/CT (CLIP) ×3 IMPLANT
DERMABOND ADVANCED (GAUZE/BANDAGES/DRESSINGS) ×2
DERMABOND ADVANCED .7 DNX12 (GAUZE/BANDAGES/DRESSINGS) ×1 IMPLANT
ELECT REM PT RETURN 9FT ADLT (ELECTROSURGICAL) ×3
ELECTRODE REM PT RTRN 9FT ADLT (ELECTROSURGICAL) ×1 IMPLANT
GLOVE BIO SURGEON STRL SZ7 (GLOVE) ×3 IMPLANT
GLOVE BIOGEL PI IND STRL 7.0 (GLOVE) IMPLANT
GLOVE BIOGEL PI IND STRL 7.5 (GLOVE) ×1 IMPLANT
GLOVE BIOGEL PI INDICATOR 7.0 (GLOVE) ×4
GLOVE BIOGEL PI INDICATOR 7.5 (GLOVE) ×6
GLOVE SURG SS PI 6.5 STRL IVOR (GLOVE) ×2 IMPLANT
GLOVE SURG SS PI 7.5 STRL IVOR (GLOVE) ×2 IMPLANT
GOWN STRL REUS W/ TWL LRG LVL3 (GOWN DISPOSABLE) ×3 IMPLANT
GOWN STRL REUS W/TWL LRG LVL3 (GOWN DISPOSABLE) ×9
HEMOSTAT SPONGE AVITENE ULTRA (HEMOSTASIS) ×2 IMPLANT
KIT BASIN OR (CUSTOM PROCEDURE TRAY) ×3 IMPLANT
KIT TURNOVER KIT B (KITS) ×3 IMPLANT
NS IRRIG 1000ML POUR BTL (IV SOLUTION) ×3 IMPLANT
PACK CV ACCESS (CUSTOM PROCEDURE TRAY) ×3 IMPLANT
PAD ARMBOARD 7.5X6 YLW CONV (MISCELLANEOUS) ×6 IMPLANT
SUT MNCRL AB 4-0 PS2 18 (SUTURE) ×3 IMPLANT
SUT PROLENE 5 0 C 1 24 (SUTURE) ×2 IMPLANT
SUT PROLENE 6 0 BV (SUTURE) ×3 IMPLANT
SUT VIC AB 3-0 SH 27 (SUTURE) ×3
SUT VIC AB 3-0 SH 27X BRD (SUTURE) ×1 IMPLANT
SYR TB 1ML LUER SLIP (SYRINGE) ×2 IMPLANT
TOWEL GREEN STERILE (TOWEL DISPOSABLE) ×3 IMPLANT
UNDERPAD 30X30 (UNDERPADS AND DIAPERS) ×3 IMPLANT
WATER STERILE IRR 1000ML POUR (IV SOLUTION) ×3 IMPLANT

## 2017-07-24 NOTE — Anesthesia Postprocedure Evaluation (Signed)
Anesthesia Post Note  Patient: Kelli [name]  Procedure(s) Performed: THROMBECTOMY OF LEFT UPPER ARM ARTERIOVENTOUS (AV) GRAFT (Left )     Patient location during evaluation: PACU Anesthesia Type: MAC Level of consciousness: awake and alert Pain management: pain level controlled Vital Signs Assessment: post-procedure vital signs reviewed and stable Respiratory status: spontaneous breathing, nonlabored ventilation, respiratory function stable and patient connected to nasal cannula oxygen Cardiovascular status: stable and blood pressure returned to baseline Postop Assessment: no apparent nausea or vomiting Anesthetic complications: no    Last Vitals:  Vitals:   07/24/17 0913 07/24/17 0930  BP: (!) 139/40 (!) 158/50  Pulse: 68 67  Resp: 12 14  Temp: (!) 36.3 C   SpO2: 100% 96%    Last Pain:  Vitals:   07/24/17 0930  PainSc: 0-No pain                 Tihanna Goodson DAVID

## 2017-07-24 NOTE — H&P (Signed)
Brief History and Physical  History of Present Illness   Kelli [name] is a 67 y.o. female who presents with chief complaint: thrombosed LUA AVG.  The patient presents today for T&R LUA AVG, possible tunneled dialysis catheter placement.  The patient reportedly last had HD on Thursday.  The LUA AVG was found clotted on Sat.  The patient's compliance in the past has been variable so Renal requested intervention on the graft over the weekend.  IR refused reportedly.    Past Medical History:  Diagnosis Date  . Anemia   . Anxiety   . Carotid artery occlusion    Occluded RICA, status post left CEA  August 2014 - Dr. Donnetta Hutching  . Cerebral infarction [facility]) Aug 2014   Bihemispheric watershed infarcts  . Cerebral infarction involving left cerebellar artery [facility]) Feb 2015  . CHF (congestive heart failure) ([place])   . CKD (chronic kidney disease) stage 3, GFR 30-59 ml/min (HCC)    dialysis T/Th/sa  . Closed dislocation of left humerus 07/26/2013  . Depression   . DM (diabetes mellitus), type 2 ([place])   . Essential hypertension, benign   . Fibromyalgia   . Mixed hyperlipidemia   . Multiple gastric ulcers   . Pneumonia   . Stroke [facility])    pt states she cannot walk, left arm weakness  . Urinary incontinence     Past Surgical History:  Procedure Laterality Date  . AV FISTULA PLACEMENT Left 04/15/2017   Procedure: ARTERIOVENOUS (AV) FISTULA CREATION LEFT ARM;  Surgeon: Serafina Mitchell, MD;  [facility] OR;  Service: Vascular;  Laterality: Left;  . AV FISTULA PLACEMENT Left 05/09/2017   Procedure: INSERTION OF ARTERIOVENOUS (AV) GORE-TEX GRAFT LEFT UPPER ARM;  Surgeon: [name], MD;  Location: [place];  Service: Vascular;  Laterality: Left;  . BIOPSY  01/22/2017   Procedure: BIOPSY;  Surgeon: Danie Binder, MD;  [facility];  Service: Endoscopy;;  gastric  . COMBINED HYSTERECTOMY VAGINAL W/ MMK / A&P REPAIR  1981  . ENDARTERECTOMY Left 10/06/2012   Procedure: Carotid  Endarterectomy with Finesse patch angioplasty;  Surgeon: [name], MD;  Location: [place];  Service: Vascular;  Laterality: Left;  . ESOPHAGOGASTRODUODENOSCOPY N/A 04/26/2014   Procedure: ESOPHAGOGASTRODUODENOSCOPY (EGD);  Surgeon: Lear Ng, MD;  Location: [facility] ENDOSCOPY;  Service: Endoscopy;  Laterality: N/A;  . ESOPHAGOGASTRODUODENOSCOPY N/A 01/22/2017   Procedure: ESOPHAGOGASTRODUODENOSCOPY (EGD);  Surgeon: Danie Binder, MD;  [facility];  Service: Endoscopy;  Laterality: N/A;  . FLEXIBLE SIGMOIDOSCOPY N/A 01/22/2017   Procedure: FLEXIBLE SIGMOIDOSCOPY;  Surgeon: Danie Binder, MD;  [facility];  Service: Endoscopy;  Laterality: N/A;  . IR CV LINE INJECTION  02/04/2017  . IR FLUORO GUIDE CV LINE LEFT  01/14/2017  . IR FLUORO GUIDE CV LINE LEFT  04/25/2017  . IR FLUORO GUIDE CV LINE LEFT  05/13/2017  . IR REMOVAL TUN CV CATH W/O FL  07/08/2017  . IR US GUIDE VASC ACCESS LEFT  01/14/2017  . LOOP RECORDER IMPLANT  04/16/13   MDT LinQ implanted for cryptogenic stroke  . [name] WITHOUT CARDIOVERSION N/A 04/16/2013   Procedure: TRANSESOPHAGEAL ECHOCARDIOGRAM ([name]);  Surgeon: Josue Hector, MD;  Location: [facility] ENDOSCOPY;  Service: Cardiovascular;  Laterality: N/A;  . URETHRAL DILATION  1980's  . VAGINAL HYSTERECTOMY  1981   "partial" (10/04/2012)    Social History   Socioeconomic History  . Marital status: Married    Spouse name:  Not on file  . Number of children: Not on file  . Years of education: Not on file  . Highest education level: Not on file  Occupational History  . Not on file  Social Needs  . Financial resource strain: Not on file  . Food insecurity:    Worry: Not on file    Inability: Not on file  . Transportation needs:    Medical: Not on file    Non-medical: Not on file  Tobacco Use  . Smoking status: Former Smoker    Packs/day: 1.00    Years: 44.00    Pack years: 44.00    Types: Cigarettes    Last attempt to quit: 10/03/2012    Years  since quitting: 4.8  . Smokeless tobacco: Never Used  Substance and Sexual Activity  . Alcohol use: No    Alcohol/week: 0.0 oz  . Drug use: No  . Sexual activity: Yes    Birth control/protection: Surgical  Lifestyle  . Physical activity:    Days per week: Not on file    Minutes per session: Not on file  . Stress: Not on file  Relationships  . Social connections:    Talks on phone: Not on file    Gets together: Not on file    Attends religious service: Not on file    Active member of club or organization: Not on file    Attends meetings of clubs or organizations: Not on file    Relationship status: Not on file  . Intimate partner violence:    Fear of current or ex partner: Not on file    Emotionally abused: Not on file    Physically abused: Not on file    Forced sexual activity: Not on file  Other Topics Concern  . Not on file  Social History Narrative   INITIALLY WORKED [place]. 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    Family History  Problem Relation Age of Onset  . Diabetes Mother   . Diabetes Father   . Hypertension Sister   . Diabetes Brother   . Seizures Son   . Kidney disease Maternal Grandmother   . Hypertension Maternal Grandmother   . Heart disease Maternal Grandfather   . Diabetes Paternal Grandmother   . Heart disease Paternal Grandfather     Current Facility-Administered Medications  Medication Dose Route Frequency Provider Last Rate Last Dose  . 0.9 % irrigation (POUR BTL)    PRN Conrad Decorah, MD   1,000 mL at 07/24/17 3267  . heparin 6,000 Units in sodium chloride 0.9 % 500 mL irrigation    PRN Conrad Cassia, MD   500 mL at 07/24/17 1245   Facility-Administered Medications Ordered in Other Encounters  Medication Dose Route Frequency Provider Last Rate Last Dose  . 0.9 %  sodium chloride infusion    Continuous PRN Everlean Cherry A, CRNA        Allergies  Allergen Reactions  . Hydrochlorothiazide Nausea  And Vomiting    Review of Systems: As listed above, otherwise negative.   Physical Examination  There were no vitals filed for this visit. There is no height or weight on file to calculate BMI.  General Alert, O x 3, WD, NAD  Pulmonary Sym exp, good B air movt, CTA B  Cardiac RRR, Nl S1, S2, no Murmurs, No rubs, No S3,S4  Musculo- skeletal LUA with echymosis in forearm, no thrill or bruit in palpable  AVG   Neurologic Pain and light touch intact in extremities,     Laboratory  See iStat   Medical Decision Making   Kelli [name] is a 67 y.o. female who presents with: thrombosed LUA AVG.   The patient is scheduled for: T&R LUA AVG, possible TDC  Risk, benefits, and alternatives to access surgery were discussed.  The patient is aware the risks include but are not limited to: bleeding, infection, steal syndrome, nerve damage, ischemic monomelic neuropathy, failure to mature, and need for additional procedures. The patient is aware the risks of tunneled dialysis catheter placement include but are not limited to: bleeding, infection, central venous injury, pneumothorax, possible venous stenosis, possible malpositioning in the venous system, and possible infections related to long-term catheter presence.   The patient is aware of the risks and agrees to proceed.   Adele Barthel, MD, FACS [facility] [place] Office: [phone] Pager: [phone]  07/24/2017, 7:39 AM

## 2017-07-24 NOTE — Anesthesia Preprocedure Evaluation (Signed)
Anesthesia Evaluation  Patient identified by MRN, date of birth, ID band Patient awake    Reviewed: Allergy & Precautions, NPO status , [name] Chart, lab work & pertinent test results  Airway Mallampati: I  TM Distance: >3 FB Neck ROM: Full    Dental   Pulmonary former smoker,    Pulmonary exam normal        Cardiovascular hypertension, Pt. on medications +CHF  Normal cardiovascular exam+ pacemaker      Neuro/Psych Anxiety Depression CVA, Residual Symptoms    GI/Hepatic   Endo/Other  diabetes, Type 2  Renal/GU ESRF and DialysisRenal disease     Musculoskeletal   Abdominal   Peds  Hematology   Anesthesia Other Findings   Reproductive/Obstetrics                             Anesthesia Physical Anesthesia Plan  ASA: III  Anesthesia Plan: MAC   Post-op Pain Management:    Induction: Intravenous  PONV Risk Score and Plan: 2 and Treatment may vary due to age or medical condition  Airway Management Planned: Simple Face Mask  Additional Equipment:   Intra-op Plan:   Post-operative Plan:   Informed Consent: I have reviewed the patients History and Physical, chart, labs and discussed the procedure including the risks, benefits and alternatives for the proposed anesthesia with the patient or authorized representative who has indicated his/her understanding and acceptance.     Plan Discussed with: CRNA and Surgeon  Anesthesia Plan Comments:         Anesthesia Quick Evaluation

## 2017-07-24 NOTE — Anesthesia Procedure Notes (Signed)
Procedure Name: MAC Date/Time: 07/24/2017 8:06 AM Performed by: Colin Benton, CRNA Pre-anesthesia Checklist: Patient identified, Emergency Drugs available, Suction available and Patient being monitored Patient Re-evaluated:Patient Re-evaluated prior to induction Oxygen Delivery Method: Simple face mask Preoxygenation: Pre-oxygenation with 100% oxygen Induction Type: IV induction Placement Confirmation: positive ETCO2

## 2017-07-24 NOTE — Op Note (Addendum)
OPERATIVE NOTE   PROCEDURE:  Thrombectomy of left upper arm arteriovenous graft  PRE-OPERATIVE DIAGNOSIS: thrombosed arteriovenous graft  POST-OPERATIVE DIAGNOSIS: same as above   SURGEON: Adele Barthel, MD  ASSISTANT(S): RNFA  ANESTHESIA: local and MAC  ESTIMATED BLOOD LOSS: 50 cc  FINDING(S): 1. No neointimal hyperplasia at venous anastomosis.   2. Acute clot through arteriovenous graft   SPECIMEN(S):  none  INDICATIONS:   Kelli [name] is a 67 y.o. female who presents with thrombosed left upper arm arteriovenous graft.  I recommended: thrombectomy and revision of left arteriovenous graft, possible tunneled dialysis catheter placement. Risk, benefits, and alternatives to this surgery were discussed.  The patient is aware the risks include but are not limited to: bleeding, infection, steal syndrome, nerve damage, recurrent thrombosis, and need for additional procedures.  The patient is aware the risks of tunneled dialysis catheter placement include but are not limited to: bleeding, infection, central venous injury, pneumothorax, possible venous stenosis, possible malpositioning in the venous system, and possible infections related to long-term catheter presence. The patient is aware of the risks and elects to proceed forward.   DESCRIPTION: After full informed written consent was obtained from the patient, the patient was brought back to the operating room and placed supine upon the operating table.  The patient was given IV antibiotics prior to proceeding.  After obtaining adequate sedation, the patient was prepped and draped in standard fashion for a left arm access procedure.    I turned my attention first to the axilla.  I injected 10 cc of 1% lidocaine without epinephrine.  I made an incision over the venous anastomosis of the arteriovenous graft and dissected down to the graft.  I dissected out the graft distally and the vein proximally.  This appeared to be an end-to-side  anastomosis to a high axillary vein.  I gave 3000 units of Heparin intravenously.  After waiting 3 minutes, I made a graftotomy near the hood of the graft with a 11-blade and then extended it onto the hood with a Potts scissor.  I passed a 4 Fogarty balloon proximally through the vein, retrieving: some acute thrombus without any resistance to balloon passage.   I had return of vigorous backbleeding from the vein.  I clamped the vein distal to the anastomosis.  I could easily see the anastomosis and there was no neointimal hyperplasia.  I then passed a 4 Fogarty balloon distally until I retrieved the arterial meniscus.  This required 3 passage of the Fogarty to remove all thrombus.  I had two negative passes before I felt comfortable to proceed.  I had return of pulsatile blood in the arteriovenous graft.  The graft was clamped.   I repaired the graftotomy with a running stitch of 5-0 Prolene.    Prior to completing the repair the graft was bled: there was pulsatile antegrade  bleeding.  Also the vein was backbled: there was vigorous backbleeding.   The repair was completed in the usual fashion.  All clamps were removed.  There was a faintly palpable thrill in the arteriovenous graft.  The radial artery was dopplerable.  The proximal vein had a dopplerable flow signature consistent with a widely patent arteriovenous graft with some pulsatile character.    I placed Avitene in the axillary incision.  After waiting a few minutes, I removed all the Avitene and washed out the wound.  There was no more active bleeding.  The subcutaneous tissue was repaired with running stitch of 3-0  Vicryl.  The skin was then reapproximated with running subcuticular 4-0 Monocryl.  The skin was then cleaned, dried, and then the skin closure was reinforced with Dermabond.     COMPLICATIONS: none  CONDITION: stable   Adele Barthel, MD, [facility] [place] Office: [phone] Pager:  [phone]  07/24/2017, 9:09 AM

## 2017-07-24 NOTE — Transfer of Care (Signed)
Immediate Anesthesia Transfer of Care Note  Patient: Kelli [name]  Procedure(s) Performed: THROMBECTOMY OF LEFT UPPER ARM ARTERIOVENTOUS (AV) GRAFT (Left )  Patient Location: PACU  Anesthesia Type:MAC  Level of Consciousness: drowsy and patient cooperative  Airway & Oxygen Therapy: Patient Spontanous Breathing and Patient connected to face mask oxygen  Post-op Assessment: Report given to RN and Post -op Vital signs reviewed and stable  Post vital signs: Reviewed and stable  Last Vitals:  Vitals Value Taken Time  BP 139/40 07/24/2017  9:13 AM  Temp    Pulse 64 07/24/2017  9:15 AM  Resp 19 07/24/2017  9:15 AM  SpO2 100 % 07/24/2017  9:15 AM  Vitals shown include unvalidated device data.  Last Pain: There were no vitals filed for this visit.       Complications: No apparent anesthesia complications

## 2017-07-25 ENCOUNTER — Encounter (HOSPITAL_COMMUNITY): Payer: Self-pay | Admitting: Vascular Surgery

## 2017-07-27 ENCOUNTER — Telehealth: Payer: Self-pay | Admitting: Vascular Surgery

## 2017-07-27 NOTE — Telephone Encounter (Signed)
sch appt [facility] to pt 08/24/17 130pm P/O P A s/p thrombectomy of LUA AVG per stf msg

## 2017-08-24 NOTE — Progress Notes (Deleted)
  POST OPERATIVE OFFICE NOTE    CC:  F/u for surgery  HPI:  This is a 67 y.o. female who is s/p thrombectomy of LUA AVG on 07/24/17 by Dr. Bridgett Larsson.   Allergies  Allergen Reactions  . Hydrochlorothiazide Nausea And Vomiting    Current Outpatient Medications  Medication Sig Dispense Refill  . amLODipine (NORVASC) 10 MG tablet Take 10 mg by mouth daily.    Marland Kitchen atorvastatin (LIPITOR) 40 MG tablet Take 1 tablet (40 mg total) by mouth daily at 6 PM. 30 tablet 0  . budesonide (PULMICORT) 0.5 MG/2ML nebulizer solution Take 2 mLs (0.5 mg total) by nebulization 2 (two) times daily. (Patient not taking: Reported on 04/21/2017)  12  . calcitRIOL (ROCALTROL) 0.25 MCG capsule Take 1 capsule (0.25 mcg total) by mouth daily. 30 capsule 0  . clopidogrel (PLAVIX) 75 MG tablet Take 1 tablet (75 mg total) by mouth daily.    Marland Kitchen epoetin alfa (EPOGEN,PROCRIT) 81856 UNIT/ML injection Inject 1 mL (10,000 Units total) into the vein Every Tuesday,Thursday,and Saturday with dialysis. 1 mL   . guaiFENesin-dextromethorphan (ROBITUSSIN DM) 100-10 MG/5ML syrup Take 5 mLs by mouth every 4 (four) hours as needed for cough. 118 mL 0  . hydrALAZINE (APRESOLINE) 50 MG tablet Take 1 tablet (50 mg total) by mouth 3 (three) times daily. 90 tablet 0  . HYDROcodone-acetaminophen (NORCO) 5-325 MG tablet Take 1 tablet by mouth every 6 (six) hours as needed for moderate pain. 10 tablet 0  . insulin aspart (NOVOLOG) 100 UNIT/ML injection Inject 0-15 Units into the skin 3 (three) times daily with meals. (Patient not taking: Reported on 04/21/2017) 10 mL 11  . ipratropium-albuterol (DUONEB) 0.5-2.5 (3) MG/3ML SOLN Take 3 mLs by nebulization every 6 (six) hours as needed. (Patient not taking: Reported on 04/21/2017) 360 mL 1  . loratadine (CLARITIN) 10 MG tablet Take 1 tablet (10 mg total) by mouth daily. (Patient taking differently: Take 10 mg by mouth daily as needed for allergies. ) 30 tablet 1  . metoprolol succinate (TOPROL-XL) 100 MG 24 hr  tablet Take 1 tablet (100 mg total) by mouth daily. 30 tablet 0  . pantoprazole (PROTONIX) 40 MG tablet Take 1 tablet (40 mg total) by mouth 2 (two) times daily before a meal.    . potassium chloride SA (K-DUR,KLOR-CON) 20 MEQ tablet Take 20 mEq by mouth 2 (two) times daily.  2  . sodium bicarbonate 650 MG tablet Take 650 mg by mouth 3 (three) times daily.     Marland Kitchen torsemide (DEMADEX) 20 MG tablet Take 20 mg by mouth daily.    Marland Kitchen triamcinolone cream (KENALOG) 0.1 % Apply 1 application topically 3 (three) times daily as needed (for rash).     . venlafaxine XR (EFFEXOR-XR) 150 MG 24 hr capsule Take 150 mg by mouth daily with breakfast.     No current facility-administered medications for this visit.      ROS:  See HPI  Physical Exam:  *** Incision:  *** Extremities:  *** Neuro: *** Abdomen:  ***  Assessment/Plan:  This is a 67 y.o. female who is s/p: thrombectomy of LUA AVG on 07/24/17 by Dr. Bridgett Larsson.    -***   Leontine Locket, PA-C [facility] [phone]  Clinic MD:  Bridgett Larsson

## 2017-08-25 DIAGNOSIS — M7540 Impingement syndrome of unspecified shoulder: Secondary | ICD-10-CM | POA: Insufficient documentation

## 2017-11-08 DIAGNOSIS — Z9889 Other specified postprocedural states: Secondary | ICD-10-CM | POA: Insufficient documentation

## 2017-11-16 DIAGNOSIS — T82898A Other specified complication of vascular prosthetic devices, implants and grafts, initial encounter: Secondary | ICD-10-CM | POA: Diagnosis not present

## 2017-11-16 DIAGNOSIS — Z992 Dependence on renal dialysis: Secondary | ICD-10-CM | POA: Diagnosis not present

## 2017-11-16 DIAGNOSIS — N186 End stage renal disease: Secondary | ICD-10-CM | POA: Diagnosis not present

## 2017-11-16 DIAGNOSIS — T82868A Thrombosis of vascular prosthetic devices, implants and grafts, initial encounter: Secondary | ICD-10-CM | POA: Diagnosis not present

## 2017-11-29 DIAGNOSIS — T82868A Thrombosis of vascular prosthetic devices, implants and grafts, initial encounter: Secondary | ICD-10-CM | POA: Diagnosis not present

## 2017-11-29 DIAGNOSIS — I871 Compression of vein: Secondary | ICD-10-CM | POA: Diagnosis not present

## 2017-11-29 DIAGNOSIS — N186 End stage renal disease: Secondary | ICD-10-CM | POA: Diagnosis not present

## 2017-11-29 DIAGNOSIS — Z992 Dependence on renal dialysis: Secondary | ICD-10-CM | POA: Diagnosis not present

## 2017-12-06 DIAGNOSIS — M7540 Impingement syndrome of unspecified shoulder: Secondary | ICD-10-CM | POA: Insufficient documentation

## 2018-01-02 ENCOUNTER — Other Ambulatory Visit: Payer: Self-pay

## 2018-01-02 DIAGNOSIS — N186 End stage renal disease: Secondary | ICD-10-CM

## 2018-01-02 DIAGNOSIS — Z992 Dependence on renal dialysis: Principal | ICD-10-CM

## 2018-01-16 ENCOUNTER — Ambulatory Visit (HOSPITAL_COMMUNITY)
Admission: RE | Admit: 2018-01-16 | Discharge: 2018-01-16 | Disposition: A | Discharge status: Home / Self Care | Payer: 59 | Source: Ambulatory Visit | Attending: Vascular Surgery | Admitting: Vascular Surgery

## 2018-01-16 ENCOUNTER — Ambulatory Visit (INDEPENDENT_AMBULATORY_CARE_PROVIDER_SITE_OTHER): Payer: 59 | Admitting: Vascular Surgery

## 2018-01-16 ENCOUNTER — Encounter: Payer: Self-pay | Admitting: Vascular Surgery

## 2018-01-16 VITALS — BP 198/81 | HR 73 | Temp 97.3°F | Ht 62.0 in | Wt 180.0 lb

## 2018-01-16 DIAGNOSIS — Z992 Dependence on renal dialysis: Secondary | ICD-10-CM | POA: Diagnosis not present

## 2018-01-16 DIAGNOSIS — N186 End stage renal disease: Secondary | ICD-10-CM

## 2018-01-16 DIAGNOSIS — Z01810 Encounter for preprocedural cardiovascular examination: Secondary | ICD-10-CM | POA: Insufficient documentation

## 2018-01-16 NOTE — Progress Notes (Signed)
[facility]  Patient name: Kelli [name] MRN: [id number] DOB: Aug 05, 1950 Sex: female  REASON FOR VISIT: Discuss access for hemodialysis  HPI: Kelli [name] is a 67 y.o. female here today for discussion of access for hemodialysis.  Currently is having dialysis via a left IJ catheter.  She is here with her husband.  They deny any issues with infection of her catheter.  She has had to have it replaced once due to poor flow.  Had attempted first stage basilic vein fistula on the left which thrombosed and then had placement of a new left upper arm AV graft by myself in March 2019.  She had Franciszek Platten thrombosis of this and had thrombectomy with no apparent narrowing in May 2019.  She reports that the graft has been clotted for 1 to 2 months.  Is a history of a major stroke and is flaccid in her left arm.  She is not willing to consider access in her right arm.  Past Medical History:  Diagnosis Date  . Anemia   . Anxiety   . Carotid artery occlusion    Occluded RICA, status post left CEA  August 2014 - Dr. Donnetta Hutching  . Cerebral infarction [facility]) Aug 2014   Bihemispheric watershed infarcts  . 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 CHF (congestive heart failure) ([place])   . CKD (chronic kidney disease) stage 3, GFR 30-59 ml/min (HCC)    dialysis T/Th/sa  . Closed dislocation of left humerus 07/26/2013  . Depression   . DM (diabetes mellitus), type 2 ([place])   . Essential hypertension, benign   . Fibromyalgia   . Mixed hyperlipidemia   . Multiple gastric ulcers   . Pneumonia   . Stroke [facility])    pt states she cannot walk, left arm weakness  . Urinary incontinence     Family History  Problem Relation Age of Onset  . Diabetes Mother   . Diabetes Father   . Hypertension Sister   . Diabetes Brother   . Seizures Son   . Kidney disease Maternal Grandmother   . Hypertension Maternal Grandmother   . Heart  disease Maternal Grandfather   . Diabetes Paternal Grandmother   . Heart disease Paternal Grandfather     SOCIAL HISTORY: Social History   Tobacco Use  . Smoking status: Former Smoker    Packs/day: 1.00    Years: 44.00    Pack years: 44.00    Types: Cigarettes    Last attempt to quit: 10/03/2012    Years since quitting: 5.2  . Smokeless tobacco: Never Used  Substance Use Topics  . Alcohol use: No    Alcohol/week: 0.0 standard drinks    Allergies  Allergen Reactions  . Hydrochlorothiazide Nausea And Vomiting    Current Outpatient Medications  Medication Sig Dispense Refill  . amLODipine (NORVASC) 10 MG tablet Take 10 mg by mouth daily.    . clonazePAM (KLONOPIN) 0.5 MG tablet Take 0.5 mg by mouth 2 (two) times daily as needed for anxiety.    . clopidogrel (PLAVIX) 75 MG tablet Take 1 tablet (75 mg total) by mouth daily.    Marland Kitchen torsemide (DEMADEX) 20 MG tablet Take 20 mg by mouth daily.    Marland Kitchen venlafaxine XR (EFFEXOR-XR) 150 MG 24 hr capsule Take 150 mg by mouth daily with breakfast.    . calcitRIOL (ROCALTROL) 0.25 MCG capsule Take 1 capsule (0.25 mcg total) by mouth daily. 30 capsule  0  . epoetin alfa (EPOGEN,PROCRIT) 18563 UNIT/ML injection Inject 1 mL (10,000 Units total) into the vein Every Tuesday,Thursday,and Saturday with dialysis. 1 mL   . hydrALAZINE (APRESOLINE) 50 MG tablet Take 1 tablet (50 mg total) by mouth 3 (three) times daily. 90 tablet 0  . insulin aspart (NOVOLOG) 100 UNIT/ML injection Inject 0-15 Units into the skin 3 (three) times daily with meals. (Patient not taking: Reported on 04/21/2017) 10 mL 11  . ipratropium-albuterol (DUONEB) 0.5-2.5 (3) MG/3ML SOLN Take 3 mLs by nebulization every 6 (six) hours as needed. (Patient not taking: Reported on 04/21/2017) 360 mL 1  . metoprolol succinate (TOPROL-XL) 100 MG 24 hr tablet Take 1 tablet (100 mg total) by mouth daily. 30 tablet 0  . pantoprazole (PROTONIX) 40 MG tablet Take 1 tablet (40 mg total) by mouth 2 (two)  times daily before a meal.    . potassium chloride SA (K-DUR,KLOR-CON) 20 MEQ tablet Take 20 mEq by mouth 2 (two) times daily.  2  . sodium bicarbonate 650 MG tablet Take 650 mg by mouth 3 (three) times daily.     Marland Kitchen triamcinolone cream (KENALOG) 0.1 % Apply 1 application topically 3 (three) times daily as needed (for rash).      No current facility-administered medications for this visit.     REVIEW OF SYSTEMS:  [X]  denotes positive finding, [ ]  denotes negative finding Cardiac  Comments:  Chest pain or chest pressure:    Shortness of breath upon exertion:    Short of breath when lying flat:    Irregular heart rhythm:        Vascular    Pain in calf, thigh, or hip brought on by ambulation:    Pain in feet at night that wakes you up from your sleep:     Blood clot in your veins:    Leg swelling:           PHYSICAL EXAM: Vitals:   01/16/18 1200  BP: (!) 198/81  Pulse: 73  Temp: (!) 97.3 F (36.3 C)  TempSrc: Temporal  Weight: 180 lb (81.6 kg)  Height: 5\' 2"  (1.575 m)    GENERAL: The patient is a well-nourished female, in no acute distress. The vital signs are documented above. CARDIOVASCULAR: 2+ radial pulses bilaterally.  Left upper arm AV graft without evidence of infection.  There is no thrill and this is thrombosed.  She has very small surface veins bilaterally PULMONARY: There is good air exchange  MUSCULOSKELETAL: There are no major deformities or cyanosis. NEUROLOGIC: No focal weakness or paresthesias are detected. SKIN: There are no ulcers or rashes noted. PSYCHIATRIC: The patient has a normal affect.  DATA:  She was scheduled to have imaging of her arms today at [facility]  to determine options.  After presenting there she elected not to have the study done since she is not willing to have consideration of right arm access  MEDICAL ISSUES: I had a very long discussion with the patient and her husband.  Explained option of continued use of her catheter with risk  of severe infection.  Did offer placement of a graft in her right arm which she is not willing to agree to.  Since she is flaccid in her left arm and would have to keep her right arm still she feels that she cannot do this several hours 3 times a week.  I did discuss the possibility of leg graft.  She is obese and feel that she would have a  very high risk of complications related to this.  She plans to continue hemodialysis with catheter only at this time and will notify us if she reconsiders    [name], MD [facility] Tel [phone] Pager [phone]

## 2018-02-28 ENCOUNTER — Other Ambulatory Visit (HOSPITAL_COMMUNITY): Payer: Self-pay | Admitting: Nephrology

## 2018-02-28 ENCOUNTER — Other Ambulatory Visit: Payer: Self-pay | Admitting: Radiology

## 2018-02-28 DIAGNOSIS — N186 End stage renal disease: Secondary | ICD-10-CM

## 2018-02-28 DIAGNOSIS — Z992 Dependence on renal dialysis: Principal | ICD-10-CM

## 2018-03-02 ENCOUNTER — Encounter (HOSPITAL_COMMUNITY): Payer: Self-pay | Admitting: Interventional Radiology

## 2018-03-02 ENCOUNTER — Other Ambulatory Visit: Payer: Self-pay

## 2018-03-02 ENCOUNTER — Ambulatory Visit (HOSPITAL_COMMUNITY)
Admission: RE | Admit: 2018-03-02 | Discharge: 2018-03-02 | Disposition: A | Discharge status: Home / Self Care | Payer: 59 | Source: Ambulatory Visit | Attending: Nephrology | Admitting: Nephrology

## 2018-03-02 DIAGNOSIS — T82898A Other specified complication of vascular prosthetic devices, implants and grafts, initial encounter: Secondary | ICD-10-CM | POA: Insufficient documentation

## 2018-03-02 DIAGNOSIS — Y831 Surgical operation with implant of artificial internal device as the cause of abnormal reaction of the patient, or of later complication, without mention of misadventure at the time of the procedure: Secondary | ICD-10-CM | POA: Insufficient documentation

## 2018-03-02 DIAGNOSIS — T8249XA Other complication of vascular dialysis catheter, initial encounter: Secondary | ICD-10-CM | POA: Diagnosis not present

## 2018-03-02 DIAGNOSIS — N186 End stage renal disease: Secondary | ICD-10-CM | POA: Insufficient documentation

## 2018-03-02 DIAGNOSIS — Z4901 Encounter for fitting and adjustment of extracorporeal dialysis catheter: Secondary | ICD-10-CM | POA: Diagnosis not present

## 2018-03-02 DIAGNOSIS — Z992 Dependence on renal dialysis: Secondary | ICD-10-CM | POA: Diagnosis not present

## 2018-03-02 HISTORY — PX: IR FLUORO GUIDE CV LINE LEFT: IMG2282

## 2018-03-02 LAB — GLUCOSE, CAPILLARY: GLUCOSE-CAPILLARY: 208 mg/dL — AB (ref 70–99)

## 2018-03-02 LAB — POTASSIUM: POTASSIUM: 4.2 mmol/L (ref 3.5–5.1)

## 2018-03-02 MED ORDER — CHLORHEXIDINE GLUCONATE 4 % EX LIQD
CUTANEOUS | Status: AC | PRN
  Administered 2018-03-02: 1 via TOPICAL

## 2018-03-02 MED ORDER — CHLORHEXIDINE GLUCONATE 4 % EX LIQD
CUTANEOUS | Status: AC
  Filled 2018-03-02: qty 15

## 2018-03-02 MED ORDER — HEPARIN SODIUM (PORCINE) 1000 UNIT/ML IJ SOLN
INTRAMUSCULAR | Status: AC
  Administered 2018-03-02: 4200 [IU]
  Filled 2018-03-02: qty 1

## 2018-03-02 MED ORDER — CEFAZOLIN SODIUM-DEXTROSE 2-4 GM/100ML-% IV SOLN
INTRAVENOUS | Status: AC
  Filled 2018-03-02: qty 100

## 2018-03-02 MED ORDER — SODIUM CHLORIDE 0.9 % IV SOLN: INTRAVENOUS | Status: DC

## 2018-03-02 MED ORDER — LIDOCAINE HCL (PF) 1 % IJ SOLN
INTRAMUSCULAR | Status: AC | PRN
  Administered 2018-03-02: 10 mL

## 2018-03-02 MED ORDER — CEFAZOLIN SODIUM-DEXTROSE 2-4 GM/100ML-% IV SOLN
2.0000 g | INTRAVENOUS | Status: AC
  Administered 2018-03-02: 2 g via INTRAVENOUS

## 2018-03-02 MED ORDER — LIDOCAINE HCL 1 % IJ SOLN
INTRAMUSCULAR | Status: AC
  Filled 2018-03-02: qty 20

## 2018-03-02 NOTE — Procedures (Signed)
LIJV HD cath exchange 28 cm SVC RA EBL 0 Comp 0

## 2018-03-02 NOTE — Progress Notes (Signed)
   Patient Status: [facility] - Out-pt  Assessment and Plan: Patient in need of venous access.   Exchange left IJ tunneled dialysis catheter  ______________________________________________________________________   History of Present Illness: Kelli [name] is a 68 y.o. female   ESRD Pt has had several dialysis catheters; exchanges Last use this catheter Sat 12/28 successfully Last attempt Tues 12/31-- malfunction-- could not dialyze Now for exchange in IR  Allergies and medications reviewed.   Review of Systems: A 12 point ROS discussed and pertinent positives are indicated in the HPI above.  All other systems are negative.   Vital Signs: BP (!) 158/75   Pulse 75   Temp (!) 97.4 F (36.3 C) (Oral)   Resp 18   Ht 5\' 5"  (1.651 m)   Wt 180 lb (81.6 kg)   SpO2 97%   BMI 29.95 kg/m   Physical Exam Vitals signs reviewed.  Neck:     Comments: Left Tunneled IJ catheter in tact Cardiovascular:     Rate and Rhythm: Normal rate and regular rhythm.  Pulmonary:     Effort: Pulmonary effort is normal.     Breath sounds: Normal breath sounds.  Skin:    General: Skin is warm and dry.  Neurological:     Mental Status: She is oriented to person, place, and time.  Psychiatric:     Comments: Consented with Husband at Bedside      Imaging reviewed.   Labs:  COAGS: No results for input(s): INR, APTT in the last 8760 hours.  BMP: Recent Labs    04/15/17 1211 05/09/17 1021 07/24/17 0712  NA 136 137 140  K 4.2 4.8 4.3  GLUCOSE 139* 156* 144*   Scheduled for dialysis catheter exchange Pt and family aware of procedure benefits and risks including but not limited to Infection; bleeding; vessel damage Agreeable to proceed Consent signed in chart    Electronically Signed: Lavonia Drafts, PA-C 03/02/2018, 7:52 AM   I spent a total of 15 minutes in face to face in clinical consultation, greater than 50% of which was counseling/coordinating care for venous access.

## 2018-03-03 ENCOUNTER — Ambulatory Visit (HOSPITAL_COMMUNITY): Payer: 59

## 2018-04-25 ENCOUNTER — Other Ambulatory Visit (HOSPITAL_COMMUNITY): Payer: Self-pay | Admitting: Nephrology

## 2018-04-25 DIAGNOSIS — N186 End stage renal disease: Secondary | ICD-10-CM

## 2018-04-25 DIAGNOSIS — Z992 Dependence on renal dialysis: Principal | ICD-10-CM

## 2018-04-28 ENCOUNTER — Ambulatory Visit (HOSPITAL_COMMUNITY)
Admission: RE | Admit: 2018-04-28 | Discharge: 2018-04-28 | Disposition: A | Discharge status: Home / Self Care | Payer: 59 | Source: Ambulatory Visit | Attending: Nephrology | Admitting: Nephrology

## 2018-04-28 ENCOUNTER — Other Ambulatory Visit: Payer: Self-pay

## 2018-04-28 ENCOUNTER — Encounter (HOSPITAL_COMMUNITY): Payer: Self-pay

## 2018-04-28 ENCOUNTER — Other Ambulatory Visit (HOSPITAL_COMMUNITY): Payer: Self-pay | Admitting: Nephrology

## 2018-04-28 DIAGNOSIS — I2510 Atherosclerotic heart disease of native coronary artery without angina pectoris: Secondary | ICD-10-CM | POA: Insufficient documentation

## 2018-04-28 DIAGNOSIS — Z79899 Other long term (current) drug therapy: Secondary | ICD-10-CM | POA: Diagnosis not present

## 2018-04-28 DIAGNOSIS — I509 Heart failure, unspecified: Secondary | ICD-10-CM | POA: Diagnosis not present

## 2018-04-28 DIAGNOSIS — Y841 Kidney dialysis as the cause of abnormal reaction of the patient, or of later complication, without mention of misadventure at the time of the procedure: Secondary | ICD-10-CM | POA: Insufficient documentation

## 2018-04-28 DIAGNOSIS — E1122 Type 2 diabetes mellitus with diabetic chronic kidney disease: Secondary | ICD-10-CM | POA: Diagnosis not present

## 2018-04-28 DIAGNOSIS — M797 Fibromyalgia: Secondary | ICD-10-CM | POA: Diagnosis not present

## 2018-04-28 DIAGNOSIS — F419 Anxiety disorder, unspecified: Secondary | ICD-10-CM | POA: Insufficient documentation

## 2018-04-28 DIAGNOSIS — E782 Mixed hyperlipidemia: Secondary | ICD-10-CM | POA: Diagnosis not present

## 2018-04-28 DIAGNOSIS — Z8673 Personal history of transient ischemic attack (TIA), and cerebral infarction without residual deficits: Secondary | ICD-10-CM | POA: Insufficient documentation

## 2018-04-28 DIAGNOSIS — Z794 Long term (current) use of insulin: Secondary | ICD-10-CM | POA: Insufficient documentation

## 2018-04-28 DIAGNOSIS — N186 End stage renal disease: Secondary | ICD-10-CM

## 2018-04-28 DIAGNOSIS — Z992 Dependence on renal dialysis: Secondary | ICD-10-CM | POA: Insufficient documentation

## 2018-04-28 DIAGNOSIS — Z87891 Personal history of nicotine dependence: Secondary | ICD-10-CM | POA: Diagnosis not present

## 2018-04-28 DIAGNOSIS — T8241XA Breakdown (mechanical) of vascular dialysis catheter, initial encounter: Secondary | ICD-10-CM | POA: Insufficient documentation

## 2018-04-28 DIAGNOSIS — F329 Major depressive disorder, single episode, unspecified: Secondary | ICD-10-CM | POA: Diagnosis not present

## 2018-04-28 DIAGNOSIS — T8249XA Other complication of vascular dialysis catheter, initial encounter: Secondary | ICD-10-CM | POA: Diagnosis not present

## 2018-04-28 DIAGNOSIS — I132 Hypertensive heart and chronic kidney disease with heart failure and with stage 5 chronic kidney disease, or end stage renal disease: Secondary | ICD-10-CM | POA: Insufficient documentation

## 2018-04-28 HISTORY — PX: IR CV LINE INJECTION: IMG2294

## 2018-04-28 HISTORY — PX: IR PTA VENOUS EXCEPT DIALYSIS CIRCUIT: IMG6126

## 2018-04-28 HISTORY — PX: IR FLUORO GUIDE CV LINE LEFT: IMG2282

## 2018-04-28 MED ORDER — CHLORHEXIDINE GLUCONATE 4 % EX LIQD
CUTANEOUS | Status: AC
  Filled 2018-04-28: qty 15

## 2018-04-28 MED ORDER — HEPARIN SODIUM (PORCINE) 1000 UNIT/ML IJ SOLN
INTRAMUSCULAR | Status: AC
  Filled 2018-04-28: qty 1

## 2018-04-28 MED ORDER — LIDOCAINE HCL (PF) 1 % IJ SOLN
INTRAMUSCULAR | Status: DC | PRN
  Administered 2018-04-28: 10 mL

## 2018-04-28 MED ORDER — CEFAZOLIN SODIUM-DEXTROSE 2-4 GM/100ML-% IV SOLN
INTRAVENOUS | Status: AC
  Administered 2018-04-28: 2000 mg
  Filled 2018-04-28: qty 100

## 2018-04-28 MED ORDER — LIDOCAINE HCL 1 % IJ SOLN
INTRAMUSCULAR | Status: AC
  Filled 2018-04-28: qty 20

## 2018-04-28 MED ORDER — CEFAZOLIN SODIUM-DEXTROSE 1-4 GM/50ML-% IV SOLN
1.0000 g | Freq: Once | INTRAVENOUS | Status: AC
  Administered 2018-04-28: 1 g via INTRAVENOUS
  Filled 2018-04-28: qty 50

## 2018-04-28 MED ORDER — IOPAMIDOL (ISOVUE-300) INJECTION 61%
INTRAVENOUS | Status: AC
  Administered 2018-04-28: 15 mL
  Filled 2018-04-28: qty 50

## 2018-04-28 NOTE — H&P (Signed)
Chief Complaint: Malfunctioning tunneled HD catheter  Referring Physician(s): Befekadu,Belayenh  Supervising Physician: Arne [place]  Patient Status: [facility] - Out-pt  History of Present Illness: Kelli [name] is a 68 y.o. female with ESRD via left chest tunneled catheter.  She states it worked fine yesterday but it didn't work well on Saturday.  Her nephrologist is requesting exchange.  She is NPO. No blood thinners.  Past Medical History:  Diagnosis Date  . Anemia   . Anxiety   . Carotid artery occlusion    Occluded RICA, status post left CEA  August 2014 - Dr. Donnetta Hutching  . Cerebral infarction [facility]) Aug 2014   Bihemispheric watershed infarcts  . Cerebral infarction involving left cerebellar artery [facility]) Feb 2015  . CHF (congestive heart failure) ([place])   . CKD (chronic kidney disease) stage 3, GFR 30-59 ml/min (HCC)    dialysis T/Th/sa  . Closed dislocation of left humerus 07/26/2013  . Depression   . DM (diabetes mellitus), type 2 ([place])   . Essential hypertension, benign   . Fibromyalgia   . Mixed hyperlipidemia   . Multiple gastric ulcers   . Pneumonia   . Stroke [facility])    pt states she cannot walk, left arm weakness  . Urinary incontinence     Past Surgical History:  Procedure Laterality Date  . AV FISTULA PLACEMENT Left 04/15/2017   Procedure: ARTERIOVENOUS (AV) FISTULA CREATION LEFT ARM;  Surgeon: Serafina Mitchell, MD;  Location: [facility] OR;  Service: Vascular;  Laterality: Left;  . AV FISTULA PLACEMENT Left 05/09/2017   Procedure: INSERTION OF ARTERIOVENOUS (AV) GORE-TEX GRAFT LEFT UPPER ARM;  Surgeon: [name], MD;  Location: [place];  Service: Vascular;  Laterality: Left;  . BIOPSY  01/22/2017   Procedure: BIOPSY;  Surgeon: Danie Binder, MD;  Location: [facility] ENDO SUITE;  Service: Endoscopy;;  gastric  . COMBINED HYSTERECTOMY VAGINAL W/ MMK / A&P REPAIR  1981  . ENDARTERECTOMY Left 10/06/2012   Procedure: Carotid Endarterectomy with Finesse patch angioplasty;   Surgeon: [name], MD;  Location: [place];  Service: Vascular;  Laterality: Left;  . ESOPHAGOGASTRODUODENOSCOPY N/A 04/26/2014   Procedure: ESOPHAGOGASTRODUODENOSCOPY (EGD);  Surgeon: Lear Ng, MD;  Location: [facility] ENDOSCOPY;  Service: Endoscopy;  Laterality: N/A;  . ESOPHAGOGASTRODUODENOSCOPY N/A 01/22/2017   Procedure: ESOPHAGOGASTRODUODENOSCOPY (EGD);  Surgeon: Danie Binder, MD;  [facility];  Service: Endoscopy;  Laterality: N/A;  . FLEXIBLE SIGMOIDOSCOPY N/A 01/22/2017   Procedure: FLEXIBLE SIGMOIDOSCOPY;  Surgeon: Danie Binder, MD;  [facility];  Service: Endoscopy;  Laterality: N/A;  . IR CV LINE INJECTION  02/04/2017  . IR FLUORO GUIDE CV LINE LEFT  01/14/2017  . IR FLUORO GUIDE CV LINE LEFT  04/25/2017  . IR FLUORO GUIDE CV LINE LEFT  05/13/2017  . IR FLUORO GUIDE CV LINE LEFT  03/02/2018  . IR REMOVAL TUN CV CATH W/O FL  07/08/2017  . IR US GUIDE VASC ACCESS LEFT  01/14/2017  . LOOP RECORDER IMPLANT  04/16/13   MDT LinQ implanted for cryptogenic stroke  . [name] WITHOUT CARDIOVERSION N/A 04/16/2013   Procedure: TRANSESOPHAGEAL ECHOCARDIOGRAM ([name]);  Surgeon: Josue Hector, MD;  Location: [place];  Service: Cardiovascular;  Laterality: N/A;  . THROMBECTOMY AND REVISION OF ARTERIOVENTOUS (AV) GORETEX  GRAFT Left 07/24/2017   Procedure: THROMBECTOMY OF LEFT UPPER ARM ARTERIOVENTOUS (AV) GRAFT;  Surgeon: Conrad [place], MD;  Location: [place];  Service: Vascular;  Laterality: Left;  .  URETHRAL DILATION  1980's  . VAGINAL HYSTERECTOMY  1981   "partial" (10/04/2012)    Allergies: Hydrochlorothiazide  Medications: Prior to Admission medications   Medication Sig Start Date End Date Taking? Authorizing Provider  amLODipine (NORVASC) 10 MG tablet Take 10 mg by mouth daily.    [provider]  calcitRIOL (ROCALTROL) 0.25 MCG capsule Take 1 capsule (0.25 mcg total) by mouth daily. 01/30/17   Kathie Dike, MD  clonazePAM (KLONOPIN) 0.5 MG tablet  Take 0.5 mg by mouth 2 (two) times daily as needed for anxiety.    [provider]  clopidogrel (PLAVIX) 75 MG tablet Take 1 tablet (75 mg total) by mouth daily. 01/31/17   Kathie Dike, MD  epoetin alfa (EPOGEN,PROCRIT) 95621 UNIT/ML injection Inject 1 mL (10,000 Units total) into the vein Every Tuesday,Thursday,and Saturday with dialysis. 01/29/17   Kathie Dike, MD  hydrALAZINE (APRESOLINE) 50 MG tablet Take 1 tablet (50 mg total) by mouth 3 (three) times daily. 10/20/16   Cherene Altes, MD  insulin aspart (NOVOLOG) 100 UNIT/ML injection Inject 0-15 Units into the skin 3 (three) times daily with meals. 01/19/17   Reyne Dumas, MD  ipratropium-albuterol (DUONEB) 0.5-2.5 (3) MG/3ML SOLN Take 3 mLs by nebulization every 6 (six) hours as needed. Patient not taking: Reported on 04/21/2017 01/19/17   Reyne Dumas, MD  metoprolol succinate (TOPROL-XL) 100 MG 24 hr tablet Take 1 tablet (100 mg total) by mouth daily. 07/24/17   Conrad [place], MD  pantoprazole (PROTONIX) 40 MG tablet Take 1 tablet (40 mg total) by mouth 2 (two) times daily before a meal. 01/27/17   Kathie Dike, MD  potassium chloride SA (K-DUR,KLOR-CON) 20 MEQ tablet Take 20 mEq by mouth 2 (two) times daily. 04/05/17   [provider]  sodium bicarbonate 650 MG tablet Take 650 mg by mouth 3 (three) times daily.     [provider]  torsemide (DEMADEX) 20 MG tablet Take 20 mg by mouth daily.    [provider]  triamcinolone cream (KENALOG) 0.1 % Apply 1 application topically 3 (three) times daily as needed (for rash).     [provider]  venlafaxine XR (EFFEXOR-XR) 150 MG 24 hr capsule Take 150 mg by mouth daily with breakfast.    [provider]     Family History  Problem Relation Age of Onset  . Diabetes Mother   . Diabetes Father   . Hypertension Sister   . Diabetes Brother   . Seizures Son   . Kidney disease Maternal Grandmother   . Hypertension Maternal  Grandmother   . Heart disease Maternal Grandfather   . Diabetes Paternal Grandmother   . Heart disease Paternal Grandfather     Social History   Socioeconomic History  . Marital status: Married    Spouse name: Not on file  . Number of children: Not on file  . Years of education: Not on file  . Highest education level: Not on file  Occupational History  . Not on file  Social Needs  . Financial resource strain: Not on file  . Food insecurity:    Worry: Not on file    Inability: Not on file  . Transportation needs:    Medical: Not on file    Non-medical: Not on file  Tobacco Use  . Smoking status: Former Smoker    Packs/day: 1.00    Years: 44.00    Pack years: 44.00    Types: Cigarettes    Last attempt to  quit: 10/03/2012    Years since quitting: 5.5  . Smokeless tobacco: Never Used  Substance and Sexual Activity  . Alcohol use: No    Alcohol/week: 0.0 standard drinks  . Drug use: No  . Sexual activity: Yes    Birth control/protection: Surgical  Lifestyle  . Physical activity:    Days per week: Not on file    Minutes per session: Not on file  . Stress: Not on file  Relationships  . Social connections:    Talks on phone: Not on file    Gets together: Not on file    Attends religious service: Not on file    Active member of club or organization: Not on file    Attends meetings of clubs or organizations: Not on file    Relationship status: Not on file  Other Topics Concern  . Not on file  Social History Narrative   INITIALLY WORKED [place]. 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   Review of Systems  Vital Signs: There were no vitals taken for this visit.  Physical Exam Constitutional:      Appearance: She is obese.  HENT:     Head: Normocephalic and atraumatic.  Eyes:     Extraocular Movements: Extraocular movements intact.  Neck:     Musculoskeletal: Normal range of motion.  Cardiovascular:     Rate and  Rhythm: Normal rate.  Pulmonary:     Effort: Pulmonary effort is normal.  Musculoskeletal: Normal range of motion.  Skin:    General: Skin is warm and dry.  Neurological:     General: No focal deficit present.     Mental Status: She is alert and oriented to person, place, and time.  Psychiatric:        Mood and Affect: Mood normal.        Behavior: Behavior normal.        Thought Content: Thought content normal.        Judgment: Judgment normal.     Imaging: No results found.  Labs:  CBC: Recent Labs    05/09/17 1021 07/24/17 0712  HGB 9.9* 11.2*  HCT 29.0* 33.0*    COAGS: No results for input(s): INR, APTT in the last 8760 hours.  BMP: Recent Labs    05/09/17 1021 07/24/17 0712 03/02/18 0742  NA 137 140  --   K 4.8 4.3 4.2  GLUCOSE 156* 144*  --     LIVER FUNCTION TESTS: No results for input(s): BILITOT, AST, ALT, ALKPHOS, PROT, ALBUMIN in the last 8760 hours.  TUMOR MARKERS: No results for input(s): AFPTM, CEA, CA199, CHROMGRNA in the last 8760 hours.  Assessment and Plan:  ESRD with malfunctioning HD catheter.  Will proceed with exchange today by Dr. Vernard Gambles.  Risks and benefits discussed with the patient including, but not limited to bleeding, infection, vascular injury, pneumothorax which may require chest tube placement, air embolism or even death  All of the patient's questions were answered, patient is agreeable to proceed. Consent signed and in chart.  Electronically Signed: Murrell Redden, PA-C   04/28/2018, 2:18 PM      I spent a total of  15 Minutes in face to face in clinical consultation, greater than 50% of which was counseling/coordinating care for exchange of tunneled HD catheter.

## 2018-05-03 DIAGNOSIS — I120 Hypertensive chronic kidney disease with stage 5 chronic kidney disease or end stage renal disease: Secondary | ICD-10-CM | POA: Diagnosis not present

## 2018-05-03 DIAGNOSIS — I10 Essential (primary) hypertension: Secondary | ICD-10-CM | POA: Diagnosis not present

## 2018-05-03 DIAGNOSIS — R7301 Impaired fasting glucose: Secondary | ICD-10-CM | POA: Diagnosis not present

## 2018-05-03 DIAGNOSIS — N186 End stage renal disease: Secondary | ICD-10-CM | POA: Diagnosis not present

## 2018-05-03 DIAGNOSIS — I517 Cardiomegaly: Secondary | ICD-10-CM | POA: Diagnosis not present

## 2018-05-03 DIAGNOSIS — R0989 Other specified symptoms and signs involving the circulatory and respiratory systems: Secondary | ICD-10-CM | POA: Diagnosis not present

## 2018-05-03 DIAGNOSIS — R5383 Other fatigue: Secondary | ICD-10-CM | POA: Diagnosis not present

## 2018-05-03 DIAGNOSIS — E782 Mixed hyperlipidemia: Secondary | ICD-10-CM | POA: Diagnosis not present

## 2018-05-08 DIAGNOSIS — E559 Vitamin D deficiency, unspecified: Secondary | ICD-10-CM | POA: Diagnosis not present

## 2018-05-08 DIAGNOSIS — E1165 Type 2 diabetes mellitus with hyperglycemia: Secondary | ICD-10-CM | POA: Diagnosis not present

## 2018-05-08 DIAGNOSIS — E782 Mixed hyperlipidemia: Secondary | ICD-10-CM | POA: Diagnosis not present

## 2018-05-08 DIAGNOSIS — E1122 Type 2 diabetes mellitus with diabetic chronic kidney disease: Secondary | ICD-10-CM | POA: Diagnosis not present

## 2018-05-08 DIAGNOSIS — I10 Essential (primary) hypertension: Secondary | ICD-10-CM | POA: Diagnosis not present

## 2018-05-08 DIAGNOSIS — N186 End stage renal disease: Secondary | ICD-10-CM | POA: Diagnosis not present

## 2018-05-08 DIAGNOSIS — D649 Anemia, unspecified: Secondary | ICD-10-CM | POA: Diagnosis not present

## 2018-05-10 ENCOUNTER — Other Ambulatory Visit (HOSPITAL_COMMUNITY): Payer: Self-pay | Admitting: Nephrology

## 2018-05-10 DIAGNOSIS — N186 End stage renal disease: Secondary | ICD-10-CM

## 2018-05-11 ENCOUNTER — Other Ambulatory Visit: Payer: Self-pay | Admitting: Student

## 2018-05-12 ENCOUNTER — Encounter (HOSPITAL_COMMUNITY): Payer: Self-pay | Admitting: Interventional Radiology

## 2018-05-12 ENCOUNTER — Ambulatory Visit (HOSPITAL_COMMUNITY)
Admission: RE | Admit: 2018-05-12 | Discharge: 2018-05-12 | Disposition: A | Discharge status: Home / Self Care | Payer: 59 | Source: Ambulatory Visit | Attending: Nephrology | Admitting: Nephrology

## 2018-05-12 ENCOUNTER — Other Ambulatory Visit (HOSPITAL_COMMUNITY): Payer: Self-pay | Admitting: Nephrology

## 2018-05-12 ENCOUNTER — Other Ambulatory Visit: Payer: Self-pay

## 2018-05-12 DIAGNOSIS — Z8673 Personal history of transient ischemic attack (TIA), and cerebral infarction without residual deficits: Secondary | ICD-10-CM | POA: Insufficient documentation

## 2018-05-12 DIAGNOSIS — Z794 Long term (current) use of insulin: Secondary | ICD-10-CM | POA: Diagnosis not present

## 2018-05-12 DIAGNOSIS — I6521 Occlusion and stenosis of right carotid artery: Secondary | ICD-10-CM | POA: Diagnosis not present

## 2018-05-12 DIAGNOSIS — I509 Heart failure, unspecified: Secondary | ICD-10-CM | POA: Diagnosis not present

## 2018-05-12 DIAGNOSIS — T8241XA Breakdown (mechanical) of vascular dialysis catheter, initial encounter: Secondary | ICD-10-CM | POA: Insufficient documentation

## 2018-05-12 DIAGNOSIS — N186 End stage renal disease: Secondary | ICD-10-CM

## 2018-05-12 DIAGNOSIS — E785 Hyperlipidemia, unspecified: Secondary | ICD-10-CM | POA: Diagnosis not present

## 2018-05-12 DIAGNOSIS — E1122 Type 2 diabetes mellitus with diabetic chronic kidney disease: Secondary | ICD-10-CM | POA: Insufficient documentation

## 2018-05-12 DIAGNOSIS — M797 Fibromyalgia: Secondary | ICD-10-CM | POA: Diagnosis not present

## 2018-05-12 DIAGNOSIS — Z87891 Personal history of nicotine dependence: Secondary | ICD-10-CM | POA: Insufficient documentation

## 2018-05-12 DIAGNOSIS — I132 Hypertensive heart and chronic kidney disease with heart failure and with stage 5 chronic kidney disease, or end stage renal disease: Secondary | ICD-10-CM | POA: Insufficient documentation

## 2018-05-12 DIAGNOSIS — D649 Anemia, unspecified: Secondary | ICD-10-CM | POA: Insufficient documentation

## 2018-05-12 DIAGNOSIS — Z79899 Other long term (current) drug therapy: Secondary | ICD-10-CM | POA: Diagnosis not present

## 2018-05-12 DIAGNOSIS — Y841 Kidney dialysis as the cause of abnormal reaction of the patient, or of later complication, without mention of misadventure at the time of the procedure: Secondary | ICD-10-CM | POA: Insufficient documentation

## 2018-05-12 DIAGNOSIS — F419 Anxiety disorder, unspecified: Secondary | ICD-10-CM | POA: Insufficient documentation

## 2018-05-12 HISTORY — PX: IR PTA VENOUS EXCEPT DIALYSIS CIRCUIT: IMG6126

## 2018-05-12 HISTORY — PX: IR CV LINE INJECTION: IMG2294

## 2018-05-12 HISTORY — PX: IR FLUORO GUIDE CV LINE LEFT: IMG2282

## 2018-05-12 HISTORY — PX: IR TRANSCATH RETRIEVAL FB INCL GUIDANCE (MS): IMG5375

## 2018-05-12 MED ORDER — CEFAZOLIN SODIUM-DEXTROSE 2-4 GM/100ML-% IV SOLN
INTRAVENOUS | Status: AC
  Administered 2018-05-12: 2000 mg
  Filled 2018-05-12: qty 100

## 2018-05-12 MED ORDER — CHLORHEXIDINE GLUCONATE 4 % EX LIQD
CUTANEOUS | Status: AC
  Filled 2018-05-12: qty 15

## 2018-05-12 MED ORDER — LIDOCAINE HCL 1 % IJ SOLN
INTRAMUSCULAR | Status: DC | PRN
  Administered 2018-05-12: 5 mL

## 2018-05-12 MED ORDER — IOHEXOL 300 MG/ML  SOLN: 15.0000 mL | Freq: Once | INTRAMUSCULAR | Status: DC | PRN

## 2018-05-12 MED ORDER — CEFAZOLIN (ANCEF) 1 G IV SOLR: 2.0000 g | Freq: Once | INTRAVENOUS | Status: DC

## 2018-05-12 MED ORDER — CEFAZOLIN SODIUM-DEXTROSE 2-4 GM/100ML-% IV SOLN: 2.0000 g | Freq: Once | INTRAVENOUS | Status: DC

## 2018-05-12 MED ORDER — HEPARIN SODIUM (PORCINE) 1000 UNIT/ML IJ SOLN
INTRAMUSCULAR | Status: AC
  Administered 2018-05-12: 3.8 mL
  Filled 2018-05-12: qty 1

## 2018-05-12 MED ORDER — LIDOCAINE HCL 1 % IJ SOLN
INTRAMUSCULAR | Status: AC
  Filled 2018-05-12: qty 20

## 2018-05-12 NOTE — Procedures (Signed)
Interventional Radiology Procedure Note  Procedure:   Exchange of left IJ HD catheter for non-function.  Injection performed, demonstrating another fibrin sheath.  Snare stripping performed, as well as balloon angioplasty, to 24mm diameter.  New 23cm tip to cuff.  . Complications: None . Recommendations:  - OK to use - Do not submerge - Routine line care   Signed,  Dulcy Fanny. Earleen Newport, DO

## 2018-05-15 ENCOUNTER — Other Ambulatory Visit (HOSPITAL_COMMUNITY): Payer: Self-pay | Admitting: Nephrology

## 2018-05-15 ENCOUNTER — Encounter (HOSPITAL_COMMUNITY): Payer: Self-pay

## 2018-05-15 DIAGNOSIS — N186 End stage renal disease: Secondary | ICD-10-CM

## 2018-05-17 ENCOUNTER — Other Ambulatory Visit (HOSPITAL_COMMUNITY): Payer: Self-pay | Admitting: Nephrology

## 2018-05-17 DIAGNOSIS — N186 End stage renal disease: Secondary | ICD-10-CM

## 2018-05-17 DIAGNOSIS — Z992 Dependence on renal dialysis: Principal | ICD-10-CM

## 2018-05-18 ENCOUNTER — Other Ambulatory Visit (HOSPITAL_COMMUNITY): Payer: Self-pay | Admitting: Interventional Radiology

## 2018-05-18 ENCOUNTER — Encounter (HOSPITAL_COMMUNITY): Payer: Self-pay

## 2018-05-18 ENCOUNTER — Ambulatory Visit (HOSPITAL_COMMUNITY)
Admission: RE | Admit: 2018-05-18 | Discharge: 2018-05-18 | Disposition: A | Discharge status: Home / Self Care | Payer: 59 | Source: Ambulatory Visit | Attending: Nephrology | Admitting: Nephrology

## 2018-05-18 ENCOUNTER — Other Ambulatory Visit: Payer: Self-pay

## 2018-05-18 DIAGNOSIS — N186 End stage renal disease: Secondary | ICD-10-CM | POA: Diagnosis not present

## 2018-05-18 DIAGNOSIS — Z452 Encounter for adjustment and management of vascular access device: Secondary | ICD-10-CM | POA: Diagnosis not present

## 2018-05-18 DIAGNOSIS — Z992 Dependence on renal dialysis: Secondary | ICD-10-CM | POA: Insufficient documentation

## 2018-05-18 HISTORY — PX: IR CV LINE INJECTION: IMG2294

## 2018-05-18 HISTORY — PX: IR FLUORO GUIDE CV LINE LEFT: IMG2282

## 2018-05-18 MED ORDER — CHLORHEXIDINE GLUCONATE 4 % EX LIQD
CUTANEOUS | Status: AC
  Filled 2018-05-18: qty 15

## 2018-05-18 MED ORDER — LIDOCAINE HCL 1 % IJ SOLN
INTRAMUSCULAR | Status: AC | PRN
  Administered 2018-05-18: 5 mL

## 2018-05-18 MED ORDER — CEFAZOLIN SODIUM-DEXTROSE 2-4 GM/100ML-% IV SOLN: 2.0000 g | INTRAVENOUS | Status: DC

## 2018-05-18 MED ORDER — ACETAMINOPHEN 325 MG PO TABS
ORAL_TABLET | ORAL | Status: AC
  Filled 2018-05-18: qty 2

## 2018-05-18 MED ORDER — CEFAZOLIN SODIUM-DEXTROSE 2-4 GM/100ML-% IV SOLN
INTRAVENOUS | Status: AC
  Administered 2018-05-18: 2000 mg
  Filled 2018-05-18: qty 100

## 2018-05-18 MED ORDER — ACETAMINOPHEN 325 MG PO TABS
650.0000 mg | ORAL_TABLET | Freq: Once | ORAL | Status: AC
  Administered 2018-05-18: 650 mg via ORAL

## 2018-05-18 MED ORDER — HEPARIN SODIUM (PORCINE) 1000 UNIT/ML IJ SOLN
INTRAMUSCULAR | Status: AC
  Administered 2018-05-18: 3.8 [IU]
  Filled 2018-05-18: qty 1

## 2018-05-18 MED ORDER — LIDOCAINE HCL 1 % IJ SOLN
INTRAMUSCULAR | Status: AC
  Filled 2018-05-18: qty 20

## 2018-05-18 MED ORDER — IOHEXOL 300 MG/ML  SOLN
100.0000 mL | Freq: Once | INTRAMUSCULAR | Status: AC | PRN
  Administered 2018-05-18: 10 mL via INTRAVENOUS

## 2018-05-18 NOTE — Procedures (Signed)
  Procedure: Venogram, Exchange L IJ tunneled HD cath 23cm; clot found in lumen of old catheter EBL:   minimal Complications:  none immediate  See full dictation in [facility].  Dillard Cannon MD Main # [phone] Pager  [phone]

## 2018-05-29 ENCOUNTER — Telehealth (HOSPITAL_COMMUNITY): Payer: Self-pay | Admitting: Rehabilitation

## 2018-05-29 ENCOUNTER — Other Ambulatory Visit: Payer: Self-pay

## 2018-05-29 DIAGNOSIS — N186 End stage renal disease: Secondary | ICD-10-CM

## 2018-05-29 NOTE — Telephone Encounter (Signed)
The above patient or their representative was contacted and gave the following answers to these questions:         Do you have any of the following symptoms? No  Fever                    Cough                   Shortness of breath  Do  you have any of the following other symptoms? No   muscle pain         vomiting,        diarrhea        rash         weakness        red eye        abdominal pain         bruising          bruising or bleeding              joint pain           severe headache    Have you been in contact with someone who was or has been sick in the past 2 weeks? No  Yes                 Unsure                         Unable to assess   Does the person that you were in contact with have any of the following symptoms?   Cough         shortness of breath           muscle pain         vomiting,            diarrhea            rash            weakness           fever            red eye           abdominal pain           bruising  or  bleeding                joint pain                severe headache               Have you  or someone you have been in contact with traveled internationally in th last month? No        If yes, which countries?   Have you  or someone you have been in contact with traveled outside [place] in th last month? No         If yes, which state and city?   COMMENTS OR ACTION PLAN FOR THIS PATIENT:          

## 2018-05-30 ENCOUNTER — Other Ambulatory Visit: Payer: Self-pay

## 2018-05-30 ENCOUNTER — Ambulatory Visit (HOSPITAL_COMMUNITY)
Admission: RE | Admit: 2018-05-30 | Discharge: 2018-05-30 | Disposition: A | Discharge status: Home / Self Care | Payer: 59 | Source: Ambulatory Visit | Attending: Vascular Surgery | Admitting: Vascular Surgery

## 2018-05-30 ENCOUNTER — Encounter (HOSPITAL_COMMUNITY): Payer: 59

## 2018-05-30 ENCOUNTER — Ambulatory Visit (INDEPENDENT_AMBULATORY_CARE_PROVIDER_SITE_OTHER)
Admission: RE | Admit: 2018-05-30 | Discharge: 2018-05-30 | Disposition: A | Discharge status: Home / Self Care | Payer: 59 | Source: Ambulatory Visit | Attending: Vascular Surgery | Admitting: Vascular Surgery

## 2018-05-30 ENCOUNTER — Ambulatory Visit: Payer: 59 | Admitting: Vascular Surgery

## 2018-05-30 ENCOUNTER — Encounter: Payer: Self-pay | Admitting: Vascular Surgery

## 2018-05-30 ENCOUNTER — Ambulatory Visit (INDEPENDENT_AMBULATORY_CARE_PROVIDER_SITE_OTHER): Payer: 59 | Admitting: Vascular Surgery

## 2018-05-30 ENCOUNTER — Other Ambulatory Visit (HOSPITAL_COMMUNITY): Payer: 59

## 2018-05-30 VITALS — BP 157/74 | HR 78 | Temp 100.3°F | Resp 20 | Ht 65.0 in | Wt 240.3 lb

## 2018-05-30 DIAGNOSIS — N186 End stage renal disease: Secondary | ICD-10-CM

## 2018-05-30 NOTE — Progress Notes (Signed)
Vascular and Vein Specialist of [facility]  Patient name: Kelli [name] MRN: [id number] DOB: [birth year] Sex: female  REASON FOR VISIT: Discuss access for hemodialysis  HPI: Kelli [name] is a 68 y.o. female here today for discussion of hemodialysis access.  I had seen her in November 2019 for similar discussion.  She has had a prior stroke and is flaccid on her left arm.  She had undergone attempted basilic vein fistula which failed and also upper arm graft with Kelli [name] failure.  She had refused consideration of right arm access since this is her only functional arm.  Is had multiple thromboses of her catheter but has not had any infection.  Her husband reports that this is been replaced 5 times.  Past Medical History:  Diagnosis Date  . Anemia   . Anxiety   . Carotid artery occlusion    Occluded RICA, status post left CEA  August 2014 - Dr. Donnetta Hutching  . Cerebral infarction [facility]) Aug 2014   Bihemispheric watershed infarcts  . Cerebral infarction involving left cerebellar artery [facility]) Feb 2015  . CHF (congestive heart failure) ([place])   . CKD (chronic kidney disease) stage 3, GFR 30-59 ml/min (HCC)    dialysis T/Th/sa  . Closed dislocation of left humerus 07/26/2013  . Depression   . DM (diabetes mellitus), type 2 ([place])   . Essential hypertension, benign   . Fibromyalgia   . Mixed hyperlipidemia   . Multiple gastric ulcers   . Pneumonia   . Stroke [facility])    pt states she cannot walk, left arm weakness  . Urinary incontinence     Family History  Problem Relation Age of Onset  . Diabetes Mother   . Diabetes Father   . Hypertension Sister   . Diabetes Brother   . Seizures Son   . Kidney disease Maternal Grandmother   . Hypertension Maternal Grandmother   . Heart disease Maternal Grandfather   . Diabetes Paternal Grandmother   . Heart disease Paternal Grandfather     SOCIAL HISTORY: Social History   Tobacco Use  . Smoking status: Former Smoker     Packs/day: 1.00    Years: 44.00    Pack years: 44.00    Types: Cigarettes    Last attempt to quit: 10/03/2012    Years since quitting: 5.6  . Smokeless tobacco: Never Used  Substance Use Topics  . Alcohol use: No    Alcohol/week: 0.0 standard drinks    Allergies  Allergen Reactions  . Hydrochlorothiazide Nausea And Vomiting    Current Outpatient Medications  Medication Sig Dispense Refill  . amLODipine (NORVASC) 10 MG tablet Take 10 mg by mouth daily.    Marland Kitchen atorvastatin (LIPITOR) 40 MG tablet TAKE 1 TABLET BY MOUTH DAILY AT 6PM    . clonazePAM (KLONOPIN) 0.5 MG tablet Take 0.5 mg by mouth 2 (two) times daily as needed for anxiety.    . clopidogrel (PLAVIX) 75 MG tablet Take 1 tablet (75 mg total) by mouth daily.    Marland Kitchen epoetin alfa (EPOGEN,PROCRIT) 02637 UNIT/ML injection Inject 1 mL (10,000 Units total) into the vein Every Tuesday,Thursday,and Saturday with dialysis. 1 mL   . hydrALAZINE (APRESOLINE) 50 MG tablet Take 1 tablet (50 mg total) by mouth 3 (three) times daily. 90 tablet 0  . insulin aspart (NOVOLOG) 100 UNIT/ML injection Inject 0-15 Units into the skin 3 (three) times daily with meals. 10 mL 11  . ipratropium-albuterol (DUONEB) 0.5-2.5 (3) MG/3ML SOLN Take 3 mLs  by nebulization every 6 (six) hours as needed. 360 mL 1  . metoprolol succinate (TOPROL-XL) 100 MG 24 hr tablet Take 1 tablet (100 mg total) by mouth daily. 30 tablet 0  . sodium bicarbonate 650 MG tablet Take 650 mg by mouth 3 (three) times daily.     Marland Kitchen torsemide (DEMADEX) 20 MG tablet Take 20 mg by mouth daily.    Marland Kitchen triamcinolone cream (KENALOG) 0.1 % Apply 1 application topically 3 (three) times daily as needed (for rash).     . venlafaxine XR (EFFEXOR-XR) 150 MG 24 hr capsule Take 150 mg by mouth daily with breakfast.    . Vitamin D, Ergocalciferol, (DRISDOL) 1.25 MG (50000 UT) CAPS capsule Take 50,000 Units by mouth once a week.    . calcitRIOL (ROCALTROL) 0.25 MCG capsule Take 1 capsule (0.25 mcg total) by  mouth daily. (Patient not taking: Reported on 05/30/2018) 30 capsule 0   No current facility-administered medications for this visit.     REVIEW OF SYSTEMS:  [X]  denotes positive finding, [ ]  denotes negative finding Cardiac  Comments:  Chest pain or chest pressure:    Shortness of breath upon exertion:    Short of breath when lying flat:    Irregular heart rhythm:        Vascular    Pain in calf, thigh, or hip brought on by ambulation:    Pain in feet at night that wakes you up from your sleep:     Blood clot in your veins:    Leg swelling:           PHYSICAL EXAM: Vitals:   05/30/18 1044  BP: (!) 157/74  Pulse: 78  Resp: 20  Temp: 100.3 F (37.9 C)  SpO2: 96%  Weight: 240 lb 4.8 oz (109 kg)  Height: 5\' 5"  (1.651 m)    GENERAL: The patient is a well-nourished female, in no acute distress. The vital signs are documented above. CARDIOVASCULAR: Palpable radial pulses PULMONARY: There is good air exchange  MUSCULOSKELETAL: There are no major deformities or cyanosis. NEUROLOGIC: No focal weakness or paresthesias are detected. SKIN: There are no ulcers or rashes noted. PSYCHIATRIC: The patient has a normal affect.  DATA:  Duplex today shows normal arterial flow to her wrist bilaterally. Venous duplex reveals very small cephalic vein on the right and good caliber basilic vein from the antecubital space proximally  MEDICAL ISSUES: Again discussed options with the patient and her husband present.  I have recommended attempted fistula in her right arm.  She is willing to consider this now.  I would rate until the current corona epidemic has subsided since this is elective.  We will schedule her for 6 weeks for first stage basilic vein transposition fistula    [name], MD [facility] Tel [phone] Pager [phone]

## 2018-06-06 ENCOUNTER — Other Ambulatory Visit: Payer: Self-pay | Admitting: Vascular Surgery

## 2018-06-06 ENCOUNTER — Encounter: Payer: Self-pay | Admitting: Vascular Surgery

## 2018-06-12 ENCOUNTER — Telehealth: Payer: Self-pay | Admitting: *Deleted

## 2018-06-12 NOTE — Telephone Encounter (Signed)
Call from patient's spouse that they wanted to defer patient until all clear from Covid 19 precautions. He states he will call after June 1st or later when all restrictions are lifted to schedule.

## 2018-06-19 ENCOUNTER — Ambulatory Visit (HOSPITAL_COMMUNITY): Admission: RE | Admit: 2018-06-19 | Payer: Medicare Other | Source: Home / Self Care | Admitting: Vascular Surgery

## 2018-06-19 ENCOUNTER — Encounter (HOSPITAL_COMMUNITY): Admission: RE | Payer: Self-pay | Source: Home / Self Care

## 2018-06-19 SURGERY — TRANSPOSITION, VEIN, BASILIC
Anesthesia: Monitor Anesthesia Care | Site: Arm Lower | Laterality: Right

## 2018-06-20 ENCOUNTER — Other Ambulatory Visit (HOSPITAL_COMMUNITY): Payer: Self-pay | Admitting: Nephrology

## 2018-06-20 DIAGNOSIS — N186 End stage renal disease: Secondary | ICD-10-CM

## 2018-06-22 ENCOUNTER — Other Ambulatory Visit: Payer: Self-pay | Admitting: Radiology

## 2018-06-22 MED ORDER — SODIUM CHLORIDE 0.9 % IV SOLN
INTRAVENOUS | Status: AC
  Administered 2018-09-06 (×2): via INTRAVENOUS

## 2018-06-22 MED ORDER — DEXTROSE 5 % IV SOLN: 2.0000 g | INTRAVENOUS | Status: AC

## 2018-06-23 ENCOUNTER — Ambulatory Visit (HOSPITAL_COMMUNITY)
Admission: RE | Admit: 2018-06-23 | Discharge: 2018-06-23 | Disposition: A | Discharge status: Home / Self Care | Payer: 59 | Source: Ambulatory Visit | Attending: Nephrology | Admitting: Nephrology

## 2018-06-23 ENCOUNTER — Other Ambulatory Visit: Payer: Self-pay

## 2018-06-23 ENCOUNTER — Encounter (HOSPITAL_COMMUNITY): Payer: Self-pay | Admitting: Interventional Radiology

## 2018-06-23 DIAGNOSIS — N186 End stage renal disease: Secondary | ICD-10-CM

## 2018-06-23 DIAGNOSIS — I130 Hypertensive heart and chronic kidney disease with heart failure and stage 1 through stage 4 chronic kidney disease, or unspecified chronic kidney disease: Secondary | ICD-10-CM | POA: Diagnosis not present

## 2018-06-23 DIAGNOSIS — I6523 Occlusion and stenosis of bilateral carotid arteries: Secondary | ICD-10-CM | POA: Insufficient documentation

## 2018-06-23 DIAGNOSIS — Z87891 Personal history of nicotine dependence: Secondary | ICD-10-CM | POA: Insufficient documentation

## 2018-06-23 DIAGNOSIS — Z888 Allergy status to other drugs, medicaments and biological substances status: Secondary | ICD-10-CM | POA: Diagnosis not present

## 2018-06-23 DIAGNOSIS — N183 Chronic kidney disease, stage 3 (moderate): Secondary | ICD-10-CM | POA: Diagnosis not present

## 2018-06-23 DIAGNOSIS — Z794 Long term (current) use of insulin: Secondary | ICD-10-CM | POA: Insufficient documentation

## 2018-06-23 DIAGNOSIS — Z8249 Family history of ischemic heart disease and other diseases of the circulatory system: Secondary | ICD-10-CM | POA: Insufficient documentation

## 2018-06-23 DIAGNOSIS — Z452 Encounter for adjustment and management of vascular access device: Secondary | ICD-10-CM | POA: Insufficient documentation

## 2018-06-23 DIAGNOSIS — Z90711 Acquired absence of uterus with remaining cervical stump: Secondary | ICD-10-CM | POA: Diagnosis not present

## 2018-06-23 DIAGNOSIS — Z79899 Other long term (current) drug therapy: Secondary | ICD-10-CM | POA: Diagnosis not present

## 2018-06-23 DIAGNOSIS — Z992 Dependence on renal dialysis: Secondary | ICD-10-CM | POA: Diagnosis not present

## 2018-06-23 DIAGNOSIS — E782 Mixed hyperlipidemia: Secondary | ICD-10-CM | POA: Diagnosis not present

## 2018-06-23 DIAGNOSIS — Z833 Family history of diabetes mellitus: Secondary | ICD-10-CM | POA: Diagnosis not present

## 2018-06-23 DIAGNOSIS — E1122 Type 2 diabetes mellitus with diabetic chronic kidney disease: Secondary | ICD-10-CM | POA: Diagnosis not present

## 2018-06-23 DIAGNOSIS — Z7902 Long term (current) use of antithrombotics/antiplatelets: Secondary | ICD-10-CM | POA: Insufficient documentation

## 2018-06-23 DIAGNOSIS — M797 Fibromyalgia: Secondary | ICD-10-CM | POA: Insufficient documentation

## 2018-06-23 DIAGNOSIS — I509 Heart failure, unspecified: Secondary | ICD-10-CM | POA: Insufficient documentation

## 2018-06-23 HISTORY — PX: IR FLUORO GUIDE CV LINE LEFT: IMG2282

## 2018-06-23 MED ORDER — LIDOCAINE HCL 1 % IJ SOLN
INTRAMUSCULAR | Status: AC
  Filled 2018-06-23: qty 20

## 2018-06-23 MED ORDER — CEFAZOLIN SODIUM-DEXTROSE 2-4 GM/100ML-% IV SOLN
INTRAVENOUS | Status: AC
  Administered 2018-06-23: 14:00:00 2000 mg
  Filled 2018-06-23: qty 100

## 2018-06-23 MED ORDER — HEPARIN SODIUM (PORCINE) 1000 UNIT/ML IJ SOLN
INTRAMUSCULAR | Status: AC
  Administered 2018-06-23: 4.2 mL
  Filled 2018-06-23: qty 1

## 2018-06-23 MED ORDER — CHLORHEXIDINE GLUCONATE 4 % EX LIQD
CUTANEOUS | Status: AC
  Filled 2018-06-23: qty 15

## 2018-06-23 MED ORDER — LIDOCAINE HCL (PF) 1 % IJ SOLN
INTRAMUSCULAR | Status: DC | PRN
  Administered 2018-06-23: 10 mL

## 2018-06-23 NOTE — Procedures (Signed)
Interventional Radiology Procedure Note  Procedure: Exchange of tunneled HD catheter  Complications: None  Estimated Blood Loss: < 10 mL  Findings: New 28 cm tip to cuff length Palindrome catheter placed over wire. Tip in RA. OK to use.  Venetia Night. Kathlene Cote, M.D Pager:  [phone]

## 2018-06-23 NOTE — H&P (Signed)
Chief Complaint: Patient was seen in consultation today for hemodialysis catheter exchange at the request of [place]  Referring Physician(s): Fran Lowes  Supervising Physician: Aletta Edouard  Patient Status: [facility] - Out-pt  History of Present Illness: Kelli [name] is a 68 y.o. female   Dialysis catheter malfunction Slow flow-- not running well  Last use yesterday Completed dialysis as far as pt knows  Last exchange 05/18/18 Tolerated well  IMPRESSION: 1. Technically successful exchange of tunneled left IJ hemodialysis catheter with fluoroscopic guidance. Ready for routine use. 2. No evidence recurrent fibrin sheath or venous stenosis.   Past Medical History:  Diagnosis Date  . Anemia   . Anxiety   . Carotid artery occlusion    Occluded RICA, status post left CEA  August 2014 - Dr. Donnetta Hutching  . Cerebral infarction [facility]) Aug 2014   Bihemispheric watershed infarcts  . Cerebral infarction involving left cerebellar artery [facility]) Feb 2015  . CHF (congestive heart failure) ([place])   . CKD (chronic kidney disease) stage 3, GFR 30-59 ml/min (HCC)    dialysis [place]  . Closed dislocation of left humerus 07/26/2013  . Depression   . DM (diabetes mellitus), type 2 ([place])   . Essential hypertension, benign   . Fibromyalgia   . Mixed hyperlipidemia   . Multiple gastric ulcers   . Pneumonia   . Stroke [facility])    pt states she cannot walk, left arm weakness  . Urinary incontinence     Past Surgical History:  Procedure Laterality Date  . AV FISTULA PLACEMENT Left 04/15/2017   Procedure: ARTERIOVENOUS (AV) FISTULA CREATION LEFT ARM;  Surgeon: Serafina Mitchell, MD;  Location: [facility] OR;  Service: Vascular;  Laterality: Left;  . AV FISTULA PLACEMENT Left 05/09/2017   Procedure: INSERTION OF ARTERIOVENOUS (AV) GORE-TEX GRAFT LEFT UPPER ARM;  Surgeon: [name], MD;  Location: [place];  Service: Vascular;  Laterality: Left;  . BIOPSY  01/22/2017   Procedure: BIOPSY;   Surgeon: Danie Binder, MD;  Location: [facility];  Service: Endoscopy;;  gastric  . COMBINED HYSTERECTOMY VAGINAL W/ MMK / A&P REPAIR  1981  . ENDARTERECTOMY Left 10/06/2012   Procedure: Carotid Endarterectomy with Finesse patch angioplasty;  Surgeon: [name], MD;  Location: [place];  Service: Vascular;  Laterality: Left;  . ESOPHAGOGASTRODUODENOSCOPY N/A 04/26/2014   Procedure: ESOPHAGOGASTRODUODENOSCOPY (EGD);  Surgeon: Lear Ng, MD;  Location: [facility] ENDOSCOPY;  Service: Endoscopy;  Laterality: N/A;  . ESOPHAGOGASTRODUODENOSCOPY N/A 01/22/2017   Procedure: ESOPHAGOGASTRODUODENOSCOPY (EGD);  Surgeon: Danie Binder, MD;  [facility];  Service: Endoscopy;  Laterality: N/A;  . FLEXIBLE SIGMOIDOSCOPY N/A 01/22/2017   Procedure: FLEXIBLE SIGMOIDOSCOPY;  Surgeon: Danie Binder, MD;  [facility];  Service: Endoscopy;  Laterality: N/A;  . IR CV LINE INJECTION  02/04/2017  . IR CV LINE INJECTION  04/28/2018  . IR CV LINE INJECTION  05/12/2018  . IR CV LINE INJECTION  05/18/2018  . IR FLUORO GUIDE CV LINE LEFT  01/14/2017  . IR FLUORO GUIDE CV LINE LEFT  04/25/2017  . IR FLUORO GUIDE CV LINE LEFT  05/13/2017  . IR FLUORO GUIDE CV LINE LEFT  03/02/2018  . IR FLUORO GUIDE CV LINE LEFT  04/28/2018  . IR FLUORO GUIDE CV LINE LEFT  05/12/2018  . IR FLUORO GUIDE CV LINE LEFT  05/18/2018  . IR PTA VENOUS EXCEPT DIALYSIS CIRCUIT  04/28/2018  . IR PTA VENOUS EXCEPT DIALYSIS CIRCUIT  05/12/2018  .  IR REMOVAL TUN CV CATH W/O FL  07/08/2017  . IR TRANSCATH RETRIEVAL FB INCL GUIDANCE (MS)  05/12/2018  . IR US GUIDE VASC ACCESS LEFT  01/14/2017  . LOOP RECORDER IMPLANT  04/16/13   MDT LinQ implanted for cryptogenic stroke  . [name] WITHOUT CARDIOVERSION N/A 04/16/2013   Procedure: TRANSESOPHAGEAL ECHOCARDIOGRAM ([name]);  Surgeon: Josue Hector, MD;  Location: [place];  Service: Cardiovascular;  Laterality: N/A;  . THROMBECTOMY AND REVISION OF ARTERIOVENTOUS (AV) GORETEX  GRAFT Left  07/24/2017   Procedure: THROMBECTOMY OF LEFT UPPER ARM ARTERIOVENTOUS (AV) GRAFT;  Surgeon: Conrad Jennings, MD;  Location: [place];  Service: Vascular;  Laterality: Left;  . URETHRAL DILATION  1980's  . VAGINAL HYSTERECTOMY  1981   "partial" (10/04/2012)    Allergies: Hydrochlorothiazide  Medications: Prior to Admission medications   Medication Sig Start Date End Date Taking? Authorizing Provider  amLODipine (NORVASC) 10 MG tablet Take 10 mg by mouth at bedtime.     [provider]  atorvastatin (LIPITOR) 40 MG tablet Take 40 mg by mouth at bedtime.  05/17/18   [provider]  calcitRIOL (ROCALTROL) 0.25 MCG capsule Take 1 capsule (0.25 mcg total) by mouth daily. Patient not taking: Reported on 05/30/2018 01/30/17   Kathie Dike, MD  calcium carbonate (TUMS - DOSED IN MG ELEMENTAL CALCIUM) 500 MG chewable tablet Chew 2-3 tablets by mouth daily as needed for indigestion or heartburn.    [provider]  clonazePAM (KLONOPIN) 0.5 MG tablet Take 0.5 mg by mouth daily.     [provider]  clopidogrel (PLAVIX) 75 MG tablet Take 1 tablet (75 mg total) by mouth daily. Patient taking differently: Take 75 mg by mouth at bedtime.  01/31/17   Kathie Dike, MD  epoetin alfa (EPOGEN,PROCRIT) 63846 UNIT/ML injection Inject 1 mL (10,000 Units total) into the vein Every Tuesday,Thursday,and Saturday with dialysis. 01/29/17   Kathie Dike, MD  hydrALAZINE (APRESOLINE) 50 MG tablet Take 1 tablet (50 mg total) by mouth 3 (three) times daily. Patient taking differently: Take 50 mg by mouth 2 (two) times daily.  10/20/16   Cherene Altes, MD  insulin aspart (NOVOLOG) 100 UNIT/ML injection Inject 0-15 Units into the skin 3 (three) times daily with meals. Patient not taking: Reported on 06/06/2018 01/19/17   Reyne Dumas, MD  Insulin Glargine (BASAGLAR KWIKPEN) 100 UNIT/ML SOPN Inject 15 Units into the skin daily.    [provider]  metoprolol succinate (TOPROL-XL)  100 MG 24 hr tablet Take 1 tablet (100 mg total) by mouth daily. Patient taking differently: Take 100 mg by mouth at bedtime.  07/24/17   Conrad Stanton, MD  torsemide (DEMADEX) 20 MG tablet Take 20 mg by mouth daily.    [provider]  venlafaxine XR (EFFEXOR-XR) 150 MG 24 hr capsule Take 150 mg by mouth daily with breakfast.    [provider]  Vitamin D, Ergocalciferol, (DRISDOL) 1.25 MG (50000 UT) CAPS capsule Take 50,000 Units by mouth every Friday.  05/17/18   [provider]     Family History  Problem Relation Age of Onset  . Diabetes Mother   . Diabetes Father   . Hypertension Sister   . Diabetes Brother   . Seizures Son   . Kidney disease Maternal Grandmother   . Hypertension Maternal Grandmother   . Heart disease Maternal Grandfather   . Diabetes Paternal Grandmother   . Heart disease Paternal Grandfather     Social History  Socioeconomic History  . Marital status: Married    Spouse name: Not on file  . Number of children: Not on file  . Years of education: Not on file  . Highest education level: Not on file  Occupational History  . Not on file  Social Needs  . Financial resource strain: Not on file  . Food insecurity:    Worry: Not on file    Inability: Not on file  . Transportation needs:    Medical: Not on file    Non-medical: Not on file  Tobacco Use  . Smoking status: Former Smoker    Packs/day: 1.00    Years: 44.00    Pack years: 44.00    Types: Cigarettes    Last attempt to quit: 10/03/2012    Years since quitting: 5.7  . Smokeless tobacco: Never Used  Substance and Sexual Activity  . Alcohol use: No    Alcohol/week: 0.0 standard drinks  . Drug use: No  . Sexual activity: Yes    Birth control/protection: Surgical  Lifestyle  . Physical activity:    Days per week: Not on file    Minutes per session: Not on file  . Stress: Not on file  Relationships  . Social connections:    Talks on phone: Not on file    Gets  together: Not on file    Attends religious service: Not on file    Active member of club or organization: Not on file    Attends meetings of clubs or organizations: Not on file    Relationship status: Not on file  Other Topics Concern  . Not on file  Social History Narrative   INITIALLY WORKED [place]. 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    Review of Systems: A 12 point ROS discussed and pertinent positives are indicated in the HPI above.  All other systems are negative.  Review of Systems  Constitutional: Negative for activity change, fatigue and fever.  Respiratory: Positive for shortness of breath. Negative for cough.   Gastrointestinal: Negative for abdominal pain.  Neurological: Positive for weakness.  Psychiatric/Behavioral: Negative for behavioral problems and confusion.    Vital Signs: There were no vitals taken for this visit.  Physical Exam Vitals signs reviewed.  Cardiovascular:     Rate and Rhythm: Normal rate and regular rhythm.     Heart sounds: Normal heart sounds.  Pulmonary:     Breath sounds: Wheezing present.  Abdominal:     General: Bowel sounds are normal.  Musculoskeletal: Normal range of motion.  Skin:    General: Skin is warm and dry.  Neurological:     Mental Status: She is alert and oriented to person, place, and time.  Psychiatric:        Mood and Affect: Mood normal.        Behavior: Behavior normal.        Thought Content: Thought content normal.        Judgment: Judgment normal.     Imaging: Vas [place] Upper Extremity Arterial Duplex  Result Date: 05/30/2018 UPPER EXTREMITY DUPLEX STUDY Indications: Evaluation prior to placement of dialysis access. Patient has a              prior non functioning left arm AVGG. Patient has left IJ tunneled              dialysis catheter.  Risk Factors: Hypertension, past history of smoking. Performing Technologist: Delorise Shiner RVT  Examination Guidelines: A  complete evaluation includes B-mode imaging, spectral Doppler, color Doppler, and power Doppler as needed of all accessible portions of each vessel. Bilateral testing is considered an integral part of a complete examination. Limited examinations for reoccurring indications may be performed as noted.  Right Pre-Dialysis Findings: +-----------------------+----------+--------------------+--------+--------+ Location               PSV (cm/s)Intralum. Diam. (cm)WaveformComments +-----------------------+----------+--------------------+--------+--------+ Brachial Antecub. fossa96        0.56                biphasic         +-----------------------+----------+--------------------+--------+--------+ Radial Art at Wrist    76        0.24                biphasic         +-----------------------+----------+--------------------+--------+--------+ Ulnar Art at Wrist     111       0.27                biphasic         +-----------------------+----------+--------------------+--------+--------+ Left Pre-Dialysis Findings: +-----------------------+----------+--------------------+----------+--------+ Location               PSV (cm/s)Intralum. Diam. (cm)Waveform  Comments +-----------------------+----------+--------------------+----------+--------+ Brachial Antecub. fossa71        0.43                monophasic         +-----------------------+----------+--------------------+----------+--------+ Radial Art at Wrist    57        0.24                monophasic         +-----------------------+----------+--------------------+----------+--------+ Ulnar Art at Wrist     21        0.18                monophasic         +-----------------------+----------+--------------------+----------+--------+  Summary:  Right: No obstruction visualized in the right upper extremity. Left: Monophasic brachial, radial, and ulnar artery waveforms       suggest proximal obstruction. *See table(s) above for  measurements and observations. Electronically signed by Curt Jews MD on 05/30/2018 at 12:57:26 PM.    Final    Vas [place] Upper Ext Vein Mapping (pre-op Avf)  Result Date: 05/30/2018 UPPER EXTREMITY VEIN MAPPING  Indications: Pre-access. History: ESRD, patient has a previous left arm AVGG and prior failed left          basilic vein AVF.  Performing Technologist: Delorise Shiner RVT  Examination Guidelines: A complete evaluation includes B-mode imaging, spectral Doppler, color Doppler, and power Doppler as needed of all accessible portions of each vessel. Bilateral testing is considered an integral part of a complete examination. Limited examinations for reoccurring indications may be performed as noted. +-----------------+-------------+----------+--------+ Right Cephalic   Diameter (cm)Depth (cm)Findings +-----------------+-------------+----------+--------+ Shoulder             0.23                        +-----------------+-------------+----------+--------+ Prox upper arm       0.19                        +-----------------+-------------+----------+--------+ Mid upper arm        0.22                        +-----------------+-------------+----------+--------+  Dist upper arm       0.12                        +-----------------+-------------+----------+--------+ Antecubital fossa    0.14                        +-----------------+-------------+----------+--------+ Prox forearm         0.16                        +-----------------+-------------+----------+--------+ Mid forearm          0.20                        +-----------------+-------------+----------+--------+ Dist forearm         0.20                        +-----------------+-------------+----------+--------+ +-----------------+-------------+----------+--------+ Right Basilic    Diameter (cm)Depth (cm)Findings +-----------------+-------------+----------+--------+ Prox upper arm       0.78                         +-----------------+-------------+----------+--------+ Mid upper arm        0.50                        +-----------------+-------------+----------+--------+ Dist upper arm       0.32                        +-----------------+-------------+----------+--------+ Antecubital fossa    0.29                        +-----------------+-------------+----------+--------+ Basilic and cephalic vein were identified with diameters as described above. Left arm was paralyzed. Previous thrombosed AVGG identified. Neither cephalic nor basilic veins were identified. Summary: Right: Cephalic and basilic veins were identified with diameters as        described above. Left: No viable cephalic or basilic veins were identified. *See table(s) above for measurements and observations.  Diagnosing physician: Curt Jews MD Electronically signed by Curt Jews MD on 05/30/2018 at 12:57:10 PM.    Final     Labs:  CBC: Recent Labs    07/24/17 0712  HGB 11.2*  HCT 33.0*    COAGS: No results for input(s): INR, APTT in the last 8760 hours.  BMP: Recent Labs    07/24/17 0712 03/02/18 0742  NA 140  --   K 4.3 4.2  GLUCOSE 144*  --     LIVER FUNCTION TESTS: No results for input(s): BILITOT, AST, ALT, ALKPHOS, PROT, ALBUMIN in the last 8760 hours.  TUMOR MARKERS: No results for input(s): AFPTM, CEA, CA199, CHROMGRNA in the last 8760 hours.  Assessment and Plan:  Malfunction of tunneled dialysis catheter Scheduled for exchange today Pt is aware of procedure benefits and risks including but not limited to Infection; bleeding; vessel damage Pt is agreeable to proceed; consent signed in chart  Thank you for this interesting consult.  I greatly enjoyed meeting Kelli [name] and look forward to participating in their care.  A copy of this report was sent to the requesting provider on this date.  Electronically Signed: Lavonia Drafts, PA-C 06/23/2018, 1:27 PM   I spent a total of    25 Minutes in  face to  face in clinical consultation, greater than 50% of which was counseling/coordinating care for dialysis catheter exchange

## 2018-07-17 ENCOUNTER — Encounter: Payer: Self-pay | Admitting: Vascular Surgery

## 2018-07-17 ENCOUNTER — Other Ambulatory Visit: Payer: Self-pay | Admitting: Vascular Surgery

## 2018-07-19 ENCOUNTER — Other Ambulatory Visit (HOSPITAL_COMMUNITY): Payer: Self-pay | Admitting: Nephrology

## 2018-07-19 DIAGNOSIS — N186 End stage renal disease: Secondary | ICD-10-CM

## 2018-07-20 ENCOUNTER — Other Ambulatory Visit: Payer: Self-pay | Admitting: Radiology

## 2018-07-21 ENCOUNTER — Ambulatory Visit (HOSPITAL_COMMUNITY): Admission: RE | Admit: 2018-07-21 | Payer: 59 | Source: Ambulatory Visit

## 2018-07-21 ENCOUNTER — Encounter (HOSPITAL_COMMUNITY): Payer: Self-pay

## 2018-08-01 ENCOUNTER — Other Ambulatory Visit (HOSPITAL_COMMUNITY): Payer: Self-pay | Admitting: Nephrology

## 2018-08-01 ENCOUNTER — Other Ambulatory Visit: Payer: Self-pay | Admitting: Radiology

## 2018-08-01 DIAGNOSIS — N186 End stage renal disease: Secondary | ICD-10-CM

## 2018-08-02 ENCOUNTER — Other Ambulatory Visit: Payer: Self-pay

## 2018-08-02 ENCOUNTER — Encounter (HOSPITAL_COMMUNITY): Payer: Self-pay | Admitting: Interventional Radiology

## 2018-08-02 ENCOUNTER — Ambulatory Visit (HOSPITAL_COMMUNITY)
Admission: RE | Admit: 2018-08-02 | Discharge: 2018-08-02 | Disposition: A | Discharge status: Home / Self Care | Payer: 59 | Source: Ambulatory Visit | Attending: Nephrology | Admitting: Nephrology

## 2018-08-02 ENCOUNTER — Other Ambulatory Visit (HOSPITAL_COMMUNITY): Payer: Self-pay | Admitting: Nephrology

## 2018-08-02 DIAGNOSIS — Z452 Encounter for adjustment and management of vascular access device: Secondary | ICD-10-CM | POA: Insufficient documentation

## 2018-08-02 DIAGNOSIS — N186 End stage renal disease: Secondary | ICD-10-CM | POA: Insufficient documentation

## 2018-08-02 HISTORY — PX: IR FLUORO GUIDE CV LINE LEFT: IMG2282

## 2018-08-02 MED ORDER — CHLORHEXIDINE GLUCONATE 4 % EX LIQD
CUTANEOUS | Status: AC
  Filled 2018-08-02: qty 15

## 2018-08-02 MED ORDER — HEPARIN SODIUM (PORCINE) 1000 UNIT/ML IJ SOLN
INTRAMUSCULAR | Status: AC
  Filled 2018-08-02: qty 1

## 2018-08-02 MED ORDER — CEFAZOLIN SODIUM-DEXTROSE 2-4 GM/100ML-% IV SOLN
INTRAVENOUS | Status: AC
  Administered 2018-08-02: 2000 mg
  Filled 2018-08-02: qty 100

## 2018-08-02 MED ORDER — ACETAMINOPHEN 325 MG PO TABS
650.0000 mg | ORAL_TABLET | Freq: Four times a day (QID) | ORAL | Status: DC | PRN
  Administered 2018-08-02: 650 mg via ORAL

## 2018-08-02 MED ORDER — IOHEXOL 300 MG/ML  SOLN
50.0000 mL | Freq: Once | INTRAMUSCULAR | Status: AC | PRN
  Administered 2018-08-02: 13:00:00 20 mL via INTRAVENOUS

## 2018-08-02 MED ORDER — LIDOCAINE HCL 1 % IJ SOLN
INTRAMUSCULAR | Status: AC
  Filled 2018-08-02: qty 20

## 2018-08-02 MED ORDER — ACETAMINOPHEN 325 MG PO TABS
ORAL_TABLET | ORAL | Status: AC
  Filled 2018-08-02: qty 2

## 2018-08-02 NOTE — Procedures (Signed)
ESRD  S/P LT IJ HD CATH EXCHG  No comp Stable ebl min Tip svcra Ready for use

## 2018-08-07 ENCOUNTER — Encounter (HOSPITAL_COMMUNITY): Payer: Self-pay

## 2018-08-07 ENCOUNTER — Other Ambulatory Visit: Payer: Self-pay | Admitting: *Deleted

## 2018-08-07 ENCOUNTER — Telehealth: Payer: Self-pay

## 2018-08-07 ENCOUNTER — Other Ambulatory Visit: Payer: Self-pay

## 2018-08-07 NOTE — Telephone Encounter (Signed)
[facility] testing site requesting an verbal order for rapid test. Per Barnett Applebaum RN at the site she will contact patients surgeon in am .

## 2018-08-08 ENCOUNTER — Other Ambulatory Visit (HOSPITAL_COMMUNITY)
Admission: RE | Admit: 2018-08-08 | Discharge: 2018-08-08 | Disposition: A | Discharge status: Home / Self Care | Payer: 59 | Source: Ambulatory Visit | Attending: Vascular Surgery | Admitting: Vascular Surgery

## 2018-08-08 ENCOUNTER — Encounter (HOSPITAL_COMMUNITY): Payer: Self-pay | Admitting: *Deleted

## 2018-08-08 ENCOUNTER — Telehealth: Payer: Self-pay | Admitting: Vascular Surgery

## 2018-08-08 ENCOUNTER — Other Ambulatory Visit: Payer: Self-pay

## 2018-08-08 ENCOUNTER — Other Ambulatory Visit (HOSPITAL_COMMUNITY): Payer: 59

## 2018-08-08 DIAGNOSIS — Z01812 Encounter for preprocedural laboratory examination: Secondary | ICD-10-CM | POA: Insufficient documentation

## 2018-08-08 DIAGNOSIS — Z1159 Encounter for screening for other viral diseases: Secondary | ICD-10-CM | POA: Diagnosis not present

## 2018-08-08 LAB — SARS CORONAVIRUS 2 BY RT PCR (HOSPITAL ORDER, PERFORMED IN ~~LOC~~ HOSPITAL LAB): SARS Coronavirus 2: NEGATIVE

## 2018-08-08 NOTE — Telephone Encounter (Addendum)
Pt evidently misunderstood and first dose of Plavix will be held today instead of holding it 3 days prior to surgery.  Per Dr. Donnetta Hutching, patient is still ok to have her surgery scheduled for tomorrow, 08/09/18.   I called Sheppard Penton, RN with Pre-Admission and left a voice message with Dr. Luther Parody response.  Thurston Hole., LPN

## 2018-08-08 NOTE — Progress Notes (Signed)
Spoke with pt's husband, Vicente Serene for pre-op call. Pt has hx of CHF, has seen Dr. Lovena Le in the past, but no visit since 2018 noted. Pt's PCP is Dr. Jeanett Schlein. Pt is on Plavix due to a stroke, instructions from Dr. Luther Parody office were for pt to stop 3 days prior to surgery. Mr. Madani states pt took her dose last PM. I instructed him not to give pt it tonight. He states he has pulled it out of her meds. I called and spoke with Becky at Dr. Luther Parody office and let her know this. She states she will check into it.  Pt is a type 2 diabetic. Mr. Burkman says he checks her blood sugar only if she is feeling bad. He thinks the last time was a couple of weeks ago and it was 200. Mr. Garry didn't know what pt's A1C was or when it was done. He states Dr. Quintin Alto is who orders them. I called Dr. Edythe Lynn office and the last one done there was in 2018. We have one listed in 2019 and it was 4.5. Instructed Mr. Weikel to give patient 1/2 of her regular dose of Basaglar in the AM. Instructed him to check her blood sugar when she gets up in the AM and every 2 hours until they leave for the hospital. If blood sugar is 70 or below, treat with 1/2 cup of clear juice (apple or cranberry) and recheck blood sugar 15 minutes after drinking juice. If blood sugar continues to be 70 or below, call the Short Stay department and ask to speak to a nurse. He voiced understanding.  Pt was tested yesterday for Covid 19 and she is negative. Mr. Heidt states they have self quaratined, pt does go to dialysis today and knows to wear mask.    Coronavirus Screening  Have you experienced the following symptoms:  Cough NO Fever (>100.41F) NO Runny nose NO Sore throat NO Difficulty breathing/shortness of breath  NO  Have you or a family member traveled in the last 14 days and where? NO   Mr. Ratledge reminded that hospital visitation restrictions are in effect and the importance of the restrictions.

## 2018-08-09 ENCOUNTER — Ambulatory Visit (HOSPITAL_COMMUNITY): Payer: 59 | Admitting: Anesthesiology

## 2018-08-09 ENCOUNTER — Encounter (HOSPITAL_COMMUNITY): Payer: Self-pay | Admitting: *Deleted

## 2018-08-09 ENCOUNTER — Ambulatory Visit (HOSPITAL_COMMUNITY)
Admission: RE | Admit: 2018-08-09 | Discharge: 2018-08-09 | Disposition: A | Discharge status: Home / Self Care | Payer: 59 | Attending: Vascular Surgery | Admitting: Vascular Surgery

## 2018-08-09 ENCOUNTER — Encounter (HOSPITAL_COMMUNITY)
Admission: RE | Disposition: A | Discharge status: Home / Self Care | Payer: Self-pay | Source: Home / Self Care | Attending: Vascular Surgery

## 2018-08-09 ENCOUNTER — Other Ambulatory Visit: Payer: Self-pay

## 2018-08-09 DIAGNOSIS — N186 End stage renal disease: Secondary | ICD-10-CM | POA: Insufficient documentation

## 2018-08-09 DIAGNOSIS — Z79899 Other long term (current) drug therapy: Secondary | ICD-10-CM | POA: Insufficient documentation

## 2018-08-09 DIAGNOSIS — Z794 Long term (current) use of insulin: Secondary | ICD-10-CM | POA: Insufficient documentation

## 2018-08-09 DIAGNOSIS — F419 Anxiety disorder, unspecified: Secondary | ICD-10-CM | POA: Insufficient documentation

## 2018-08-09 DIAGNOSIS — I509 Heart failure, unspecified: Secondary | ICD-10-CM | POA: Insufficient documentation

## 2018-08-09 DIAGNOSIS — Z992 Dependence on renal dialysis: Secondary | ICD-10-CM | POA: Insufficient documentation

## 2018-08-09 DIAGNOSIS — E1122 Type 2 diabetes mellitus with diabetic chronic kidney disease: Secondary | ICD-10-CM | POA: Insufficient documentation

## 2018-08-09 DIAGNOSIS — Z87891 Personal history of nicotine dependence: Secondary | ICD-10-CM | POA: Insufficient documentation

## 2018-08-09 DIAGNOSIS — I69354 Hemiplegia and hemiparesis following cerebral infarction affecting left non-dominant side: Secondary | ICD-10-CM | POA: Insufficient documentation

## 2018-08-09 DIAGNOSIS — I132 Hypertensive heart and chronic kidney disease with heart failure and with stage 5 chronic kidney disease, or end stage renal disease: Secondary | ICD-10-CM | POA: Insufficient documentation

## 2018-08-09 DIAGNOSIS — I739 Peripheral vascular disease, unspecified: Secondary | ICD-10-CM | POA: Diagnosis not present

## 2018-08-09 DIAGNOSIS — N184 Chronic kidney disease, stage 4 (severe): Secondary | ICD-10-CM

## 2018-08-09 DIAGNOSIS — F329 Major depressive disorder, single episode, unspecified: Secondary | ICD-10-CM | POA: Diagnosis not present

## 2018-08-09 DIAGNOSIS — Z7902 Long term (current) use of antithrombotics/antiplatelets: Secondary | ICD-10-CM | POA: Insufficient documentation

## 2018-08-09 DIAGNOSIS — E782 Mixed hyperlipidemia: Secondary | ICD-10-CM | POA: Diagnosis not present

## 2018-08-09 DIAGNOSIS — N185 Chronic kidney disease, stage 5: Secondary | ICD-10-CM

## 2018-08-09 HISTORY — PX: BASCILIC VEIN TRANSPOSITION: SHX5742

## 2018-08-09 HISTORY — DX: Dyspnea, unspecified: R06.00

## 2018-08-09 LAB — POCT I-STAT 4, (NA,K, GLUC, HGB,HCT)
Glucose, Bld: 230 mg/dL — ABNORMAL HIGH (ref 70–99)
HCT: 33 % — ABNORMAL LOW (ref 36.0–46.0)
Hemoglobin: 11.2 g/dL — ABNORMAL LOW (ref 12.0–15.0)
Potassium: 3.2 mmol/L — ABNORMAL LOW (ref 3.5–5.1)
Sodium: 137 mmol/L (ref 135–145)

## 2018-08-09 LAB — GLUCOSE, CAPILLARY
Glucose-Capillary: 219 mg/dL — ABNORMAL HIGH (ref 70–99)
Glucose-Capillary: 155 mg/dL — ABNORMAL HIGH (ref 70–99)

## 2018-08-09 SURGERY — TRANSPOSITION, VEIN, BASILIC
Anesthesia: Monitor Anesthesia Care | Site: Arm Upper | Laterality: Right

## 2018-08-09 MED ORDER — SODIUM CHLORIDE 0.9 % IV SOLN
INTRAVENOUS | Status: DC
  Administered 2018-08-09 (×2): via INTRAVENOUS

## 2018-08-09 MED ORDER — SODIUM CHLORIDE 0.9 % IV SOLN: INTRAVENOUS | Status: DC

## 2018-08-09 MED ORDER — CEFAZOLIN SODIUM-DEXTROSE 2-4 GM/100ML-% IV SOLN
2.0000 g | INTRAVENOUS | Status: AC
  Administered 2018-08-09: 12:00:00 2 g via INTRAVENOUS

## 2018-08-09 MED ORDER — EPHEDRINE 5 MG/ML INJ
INTRAVENOUS | Status: AC
  Filled 2018-08-09: qty 10

## 2018-08-09 MED ORDER — LIDOCAINE 2% (20 MG/ML) 5 ML SYRINGE
INTRAMUSCULAR | Status: AC
  Filled 2018-08-09: qty 10

## 2018-08-09 MED ORDER — CEFAZOLIN SODIUM-DEXTROSE 2-4 GM/100ML-% IV SOLN: 2.0000 g | INTRAVENOUS | Status: DC

## 2018-08-09 MED ORDER — ONDANSETRON HCL 4 MG/2ML IJ SOLN
INTRAMUSCULAR | Status: DC | PRN
  Administered 2018-08-09: 4 mg via INTRAVENOUS

## 2018-08-09 MED ORDER — 0.9 % SODIUM CHLORIDE (POUR BTL) OPTIME
TOPICAL | Status: DC | PRN
  Administered 2018-08-09: 1000 mL

## 2018-08-09 MED ORDER — FENTANYL CITRATE (PF) 250 MCG/5ML IJ SOLN
INTRAMUSCULAR | Status: AC
  Filled 2018-08-09: qty 5

## 2018-08-09 MED ORDER — PHENYLEPHRINE 40 MCG/ML (10ML) SYRINGE FOR IV PUSH (FOR BLOOD PRESSURE SUPPORT)
PREFILLED_SYRINGE | INTRAVENOUS | Status: DC | PRN
  Administered 2018-08-09 (×2): 80 ug via INTRAVENOUS

## 2018-08-09 MED ORDER — LIDOCAINE-EPINEPHRINE 0.5 %-1:200000 IJ SOLN
INTRAMUSCULAR | Status: DC | PRN
  Administered 2018-08-09: 14 mL

## 2018-08-09 MED ORDER — SODIUM CHLORIDE 0.9 % IV SOLN
INTRAVENOUS | Status: DC
  Administered 2018-08-09: 10:00:00 via INTRAVENOUS

## 2018-08-09 MED ORDER — FENTANYL CITRATE (PF) 100 MCG/2ML IJ SOLN
INTRAMUSCULAR | Status: DC | PRN
  Administered 2018-08-09: 25 ug via INTRAVENOUS

## 2018-08-09 MED ORDER — LIDOCAINE-EPINEPHRINE 0.5 %-1:200000 IJ SOLN
INTRAMUSCULAR | Status: AC
  Filled 2018-08-09: qty 1

## 2018-08-09 MED ORDER — CEFAZOLIN SODIUM-DEXTROSE 2-4 GM/100ML-% IV SOLN
INTRAVENOUS | Status: AC
  Filled 2018-08-09: qty 100

## 2018-08-09 MED ORDER — SODIUM CHLORIDE 0.9 % IV SOLN
INTRAVENOUS | Status: AC
  Filled 2018-08-09: qty 1.2

## 2018-08-09 MED ORDER — SUCCINYLCHOLINE CHLORIDE 200 MG/10ML IV SOSY
PREFILLED_SYRINGE | INTRAVENOUS | Status: AC
  Filled 2018-08-09: qty 10

## 2018-08-09 MED ORDER — OXYCODONE-ACETAMINOPHEN 5-325 MG PO TABS: 1.0000 | ORAL_TABLET | Freq: Four times a day (QID) | ORAL | 0 refills | Status: DC | PRN

## 2018-08-09 MED ORDER — SODIUM CHLORIDE 0.9 % IV SOLN
INTRAVENOUS | Status: DC | PRN
  Administered 2018-08-09: 13:00:00 50 ug/min via INTRAVENOUS

## 2018-08-09 MED ORDER — PROPOFOL 500 MG/50ML IV EMUL
INTRAVENOUS | Status: DC | PRN
  Administered 2018-08-09: 75 ug/kg/min via INTRAVENOUS

## 2018-08-09 MED ORDER — SODIUM CHLORIDE 0.9 % IV SOLN
INTRAVENOUS | Status: DC | PRN
  Administered 2018-08-09: 500 mL

## 2018-08-09 MED ORDER — PHENYLEPHRINE 40 MCG/ML (10ML) SYRINGE FOR IV PUSH (FOR BLOOD PRESSURE SUPPORT)
PREFILLED_SYRINGE | INTRAVENOUS | Status: AC
  Filled 2018-08-09: qty 10

## 2018-08-09 SURGICAL SUPPLY — 32 items
ADH SKN CLS APL DERMABOND .7 (GAUZE/BANDAGES/DRESSINGS) ×1
ADH SKN CLS LQ APL DERMABOND (GAUZE/BANDAGES/DRESSINGS) ×1
ARMBAND PINK RESTRICT EXTREMIT (MISCELLANEOUS) ×3 IMPLANT
CANISTER SUCT 3000ML PPV (MISCELLANEOUS) ×3 IMPLANT
CANNULA VESSEL 3MM 2 BLNT TIP (CANNULA) ×3 IMPLANT
CLIP LIGATING EXTRA MED SLVR (CLIP) ×3 IMPLANT
CLIP LIGATING EXTRA SM BLUE (MISCELLANEOUS) ×3 IMPLANT
COVER PROBE W GEL 5X96 (DRAPES) ×3 IMPLANT
COVER WAND RF STERILE (DRAPES) ×3 IMPLANT
DECANTER SPIKE VIAL GLASS SM (MISCELLANEOUS) ×3 IMPLANT
DERMABOND ADHESIVE PROPEN (GAUZE/BANDAGES/DRESSINGS) ×2
DERMABOND ADVANCED (GAUZE/BANDAGES/DRESSINGS) ×2
DERMABOND ADVANCED .7 DNX12 (GAUZE/BANDAGES/DRESSINGS) ×1 IMPLANT
DERMABOND ADVANCED .7 DNX6 (GAUZE/BANDAGES/DRESSINGS) IMPLANT
ELECT REM PT RETURN 9FT ADLT (ELECTROSURGICAL) ×3
ELECTRODE REM PT RTRN 9FT ADLT (ELECTROSURGICAL) ×1 IMPLANT
GLOVE SS BIOGEL STRL SZ 7.5 (GLOVE) ×1 IMPLANT
GLOVE SUPERSENSE BIOGEL SZ 7.5 (GLOVE) ×2
GOWN STRL REUS W/ TWL LRG LVL3 (GOWN DISPOSABLE) ×3 IMPLANT
GOWN STRL REUS W/TWL LRG LVL3 (GOWN DISPOSABLE) ×9
KIT BASIN OR (CUSTOM PROCEDURE TRAY) ×3 IMPLANT
KIT TURNOVER KIT B (KITS) ×3 IMPLANT
NS IRRIG 1000ML POUR BTL (IV SOLUTION) ×3 IMPLANT
PACK CV ACCESS (CUSTOM PROCEDURE TRAY) ×3 IMPLANT
PAD ARMBOARD 7.5X6 YLW CONV (MISCELLANEOUS) ×6 IMPLANT
SUT PROLENE 6 0 CC (SUTURE) ×3 IMPLANT
SUT SILK 2 0 SH (SUTURE) IMPLANT
SUT VIC AB 3-0 SH 27 (SUTURE) ×3
SUT VIC AB 3-0 SH 27X BRD (SUTURE) ×1 IMPLANT
TOWEL GREEN STERILE (TOWEL DISPOSABLE) ×3 IMPLANT
UNDERPAD 30X30 (UNDERPADS AND DIAPERS) ×3 IMPLANT
WATER STERILE IRR 1000ML POUR (IV SOLUTION) ×3 IMPLANT

## 2018-08-09 NOTE — H&P (Signed)
Office Visit   05/30/2018 [facility] -Judge Stall, Arvilla Meres, MD  Vascular Surgery   ESRD (end stage renal disease) [facility])  Dx   Follow-up   ; Referred by Manon Hilding, MD  Reason for Visit   Additional Documentation   Vitals:   BP 157/74 (BP Location: Right Arm, Patient Position: Sitting, Cuff Size: Large)   Pulse 78   Temp 100.3 F (37.9 C)   Resp 20   Ht 5\' 5"  (1.651 m)   Wt 109 kg   SpO2 96%   BMI 39.99 kg/m   BSA 2.24 m   Flowsheets:   Clinical Intake,   Healthcare Directives,   Vital Signs,   MEWS Score,   Anthropometrics,   Method of Visit     Encounter Info:   Billing Info,   History,   Allergies,   Detailed Report     All Notes   Progress Notes by [name], MD at 05/30/2018 11:00 AM  Author: [name], MD Author Type: Physician Filed: 05/30/2018 11:30 AM  Note Status: Signed Cosign: Cosign Not Required Encounter Date: 05/30/2018  Editor: [name], MD (Physician)                                          [facility]  Patient name: Kelli [name]   MRN: [id number]        DOB: Apr 04, 1950            Sex: female  REASON FOR VISIT: Discuss access for hemodialysis  HPI: Kelli [name] is a 68 y.o. female here today for discussion of hemodialysis access.  I had seen her in November 2019 for similar discussion.  She has had a prior stroke and is flaccid on her left arm.  She had undergone attempted basilic vein fistula which failed and also upper arm graft with  failure.  She had refused consideration of right arm access since this is her only functional arm.  Is had multiple thromboses of her catheter but has not had any infection.  Her husband reports that this is been replaced 5 times.      Past Medical History:  Diagnosis Date  . Anemia   . Anxiety   . Carotid artery occlusion    Occluded RICA, status post left CEA  August 2014 - Dr. Donnetta Hutching  . Cerebral infarction [facility]) Aug  2014   Bihemispheric watershed infarcts  . Cerebral infarction involving left cerebellar artery [facility]) Feb 2015  . CHF (congestive heart failure) ([facility])   . CKD (chronic kidney disease) stage 3, GFR 30-59 ml/min (HCC)    dialysis T/Th/sa  . Closed dislocation of left humerus 07/26/2013  . Depression   . DM (diabetes mellitus), type 2 ([place])   . Essential hypertension, benign   . Fibromyalgia   . Mixed hyperlipidemia   . Multiple gastric ulcers   . Pneumonia   . Stroke [facility])    pt states she cannot walk, left arm weakness  . Urinary incontinence          Family History  Problem Relation Age of Onset  . Diabetes Mother   . Diabetes Father   . Hypertension Sister   . Diabetes Brother   . Seizures Son   . Kidney disease Maternal Grandmother   . Hypertension Maternal Grandmother   .  Heart disease Maternal Grandfather   . Diabetes Paternal Grandmother   . Heart disease Paternal Grandfather     SOCIAL HISTORY: Social History        Tobacco Use  . Smoking status: Former Smoker    Packs/day: 1.00    Years: 44.00    Pack years: 44.00    Types: Cigarettes    Last attempt to quit: 10/03/2012    Years since quitting: 5.6  . Smokeless tobacco: Never Used  Substance Use Topics  . Alcohol use: No    Alcohol/week: 0.0 standard drinks        Allergies  Allergen Reactions  . Hydrochlorothiazide Nausea And Vomiting          Current Outpatient Medications  Medication Sig Dispense Refill  . amLODipine (NORVASC) 10 MG tablet Take 10 mg by mouth daily.    Marland Kitchen atorvastatin (LIPITOR) 40 MG tablet TAKE 1 TABLET BY MOUTH DAILY AT 6PM    . clonazePAM (KLONOPIN) 0.5 MG tablet Take 0.5 mg by mouth 2 (two) times daily as needed for anxiety.    . clopidogrel (PLAVIX) 75 MG tablet Take 1 tablet (75 mg total) by mouth daily.    Marland Kitchen epoetin alfa (EPOGEN,PROCRIT) 84665 UNIT/ML injection Inject 1 mL (10,000 Units total) into the vein Every  Tuesday,Thursday,and Saturday with dialysis. 1 mL   . hydrALAZINE (APRESOLINE) 50 MG tablet Take 1 tablet (50 mg total) by mouth 3 (three) times daily. 90 tablet 0  . insulin aspart (NOVOLOG) 100 UNIT/ML injection Inject 0-15 Units into the skin 3 (three) times daily with meals. 10 mL 11  . ipratropium-albuterol (DUONEB) 0.5-2.5 (3) MG/3ML SOLN Take 3 mLs by nebulization every 6 (six) hours as needed. 360 mL 1  . metoprolol succinate (TOPROL-XL) 100 MG 24 hr tablet Take 1 tablet (100 mg total) by mouth daily. 30 tablet 0  . sodium bicarbonate 650 MG tablet Take 650 mg by mouth 3 (three) times daily.     Marland Kitchen torsemide (DEMADEX) 20 MG tablet Take 20 mg by mouth daily.    Marland Kitchen triamcinolone cream (KENALOG) 0.1 % Apply 1 application topically 3 (three) times daily as needed (for rash).     . venlafaxine XR (EFFEXOR-XR) 150 MG 24 hr capsule Take 150 mg by mouth daily with breakfast.    . Vitamin D, Ergocalciferol, (DRISDOL) 1.25 MG (50000 UT) CAPS capsule Take 50,000 Units by mouth once a week.    . calcitRIOL (ROCALTROL) 0.25 MCG capsule Take 1 capsule (0.25 mcg total) by mouth daily. (Patient not taking: Reported on 05/30/2018) 30 capsule 0   No current facility-administered medications for this visit.     REVIEW OF SYSTEMS:  [X]  denotes positive finding, [ ]  denotes negative finding Cardiac  Comments:  Chest pain or chest pressure:    Shortness of breath upon exertion:    Short of breath when lying flat:    Irregular heart rhythm:        Vascular    Pain in calf, thigh, or hip brought on by ambulation:    Pain in feet at night that wakes you up from your sleep:     Blood clot in your veins:    Leg swelling:           PHYSICAL EXAM:    Vitals:   05/30/18 1044  BP: (!) 157/74  Pulse: 78  Resp: 20  Temp: 100.3 F (37.9 C)  SpO2: 96%  Weight: 240 lb 4.8 oz (109 kg)  Height: 5'  5" (1.651 m)    GENERAL: The patient is a well-nourished female,  in no acute distress. The vital signs are documented above. CARDIOVASCULAR: Palpable radial pulses PULMONARY: There is good air exchange  MUSCULOSKELETAL: There are no major deformities or cyanosis. NEUROLOGIC: No focal weakness or paresthesias are detected. SKIN: There are no ulcers or rashes noted. PSYCHIATRIC: The patient has a normal affect.  DATA:  Duplex today shows normal arterial flow to her wrist bilaterally. Venous duplex reveals very small cephalic vein on the right and good caliber basilic vein from the antecubital space proximally  MEDICAL ISSUES: Again discussed options with the patient and her husband present.  I have recommended attempted fistula in her right arm.  She is willing to consider this now.  I would rate until the current corona epidemic has subsided since this is elective.  We will schedule her for 6 weeks for first stage basilic vein transposition fistula    [name], MD FACS [facility] [phone] Pager [phone]     Addendum:  The patient has been re-examined and re-evaluated.  The patient's history and physical has been reviewed and is unchanged.    Kelli [name] is a 68 y.o. female is being admitted with END STAGE RENAL DISEASE. All the risks, benefits and other treatment options have been discussed with the patient. The patient has consented to proceed with Procedure(s): BASILIC VEIN TRANSPOSITION as a surgical intervention.    08/09/2018 10:16 AM Vascular and Vein Surgery

## 2018-08-09 NOTE — Discharge Instructions (Signed)
° °  [facility] of [facility]  Discharge Instructions  AV Fistula or Graft Surgery for Dialysis Access  Please refer to the following instructions for your post-procedure care. Your surgeon or physician assistant will discuss any changes with you.  Activity  You may drive the day following your surgery, if you are comfortable and no longer taking prescription pain medication. Resume full activity as the soreness in your incision resolves.  Bathing/Showering  You may shower after you go home. Keep your incision dry for 48 hours. Do not soak in a bathtub, hot tub, or swim until the incision heals completely. You may not shower if you have a hemodialysis catheter.  Incision Care  Clean your incision with mild soap and water after 48 hours. Pat the area dry with a clean towel. You do not need a bandage unless otherwise instructed. Do not apply any ointments or creams to your incision. You may have skin glue on your incision. Do not peel it off. It will come off on its own in about one week. Your arm may swell a bit after surgery. To reduce swelling use pillows to elevate your arm so it is above your heart. Your doctor will tell you if you need to lightly wrap your arm with an ACE bandage.  Diet  Resume your normal diet. There are not special food restrictions following this procedure. In order to heal from your surgery, it is CRITICAL to get adequate nutrition. Your body requires vitamins, minerals, and protein. Vegetables are the best source of vitamins and minerals. Vegetables also provide the perfect balance of protein. Processed food has little nutritional value, so try to avoid this.  Medications  Resume taking all of your medications. If your incision is causing pain, you may take over-the counter pain relievers such as acetaminophen (Tylenol). If you were prescribed a stronger pain medication, please be aware these medications can cause nausea and constipation. Prevent  nausea by taking the medication with a snack or meal. Avoid constipation by drinking plenty of fluids and eating foods with high amount of fiber, such as fruits, vegetables, and grains.  Do not take Tylenol if you are taking prescription pain medications.  Follow up Your surgeon may want to see you in the office following your access surgery. If so, this will be arranged at the time of your surgery.  Please call us immediately for any of the following conditions:  Increased pain, redness, drainage (pus) from your incision site Fever of 101 degrees or higher Severe or worsening pain at your incision site Hand pain or numbness.  Reduce your risk of vascular disease:  Stop smoking. If you would like help, call [facility] at 1-800-QUIT-NOW 325 173 7392) or [place] at [place] your cholesterol Maintain a desired weight Control your diabetes Keep your blood pressure down  Dialysis  It will take several weeks to several months for your new dialysis access to be ready for use. Your surgeon will determine when it is okay to use it. Your nephrologist will continue to direct your dialysis. You can continue to use your Permcath until your new access is ready for use.   08/09/2018 Kelli [name] [id number] Nov 26, 1950  Surgeon(s): Early, Arvilla Meres, MD  Procedure(s): BASILIC VEIN TRANSPOSITION 1st Stage  x Do not stick fistula for 12 weeks    If you have any questions, please call the office at [phone].

## 2018-08-09 NOTE — Op Note (Signed)
    OPERATIVE REPORT  DATE OF SURGERY: 08/09/2018  PATIENT: Kelli [name], 68 y.o. female MRN: [id number]  DOB: 1950-09-12  PRE-OPERATIVE DIAGNOSIS: End-stage renal disease  POST-OPERATIVE DIAGNOSIS:  Same  PROCEDURE: Right first stage brachiobasilic fistula  SURGEON:  Curt Jews, M.D.  PHYSICIAN ASSISTANT: Liana Crocker, PA-C  ANESTHESIA: MAC  EBL: per anesthesia record  Total I/O In: 500 [I.V.:500] Out: 10 [Blood:10]  BLOOD ADMINISTERED: none  DRAINS: none  SPECIMEN: none  COUNTS CORRECT:  YES  PATIENT DISPOSITION:  PACU - hemodynamically stable  PROCEDURE DETAILS: Patient was taken operating placed to position the area of the right arm prepped draped in sterile fashion.  SonoSite ultrasound was used to visualize the veins.  The patient had extremely small cephalic vein.  Had a moderate sized basilic vein in the mid to distal upper arm.  There was branching with the branch directly over the antecubital space and another branch more lateral.  Incision was made over the more lateral branch of these and tributary branches were ligated and divided.  Next a separate incision was made over the brachial artery.  This was also area over the area of the antecubital branch of the basilic vein.  This vein branch did look to be better than the more lateral one.  The vein was mobilized proximally distally and was divided distally and was gently dilated with heparinized saline.  This did appear adequate for fistula creation.  The brachial artery was of good caliber with minimal atherosclerotic change.  The artery was occluded proximally distally and was opened with an 11 blade and sent longstanding with Potts scissors.  The vein was cut to the appropriate length and spatulated and sewn end-to-side to the artery with a running 6-0 Prolene suture.  Clamps removed and good thrill was noted.  Wounds were irrigated with saline.  Hemostasis electrocautery.  The wounds were closed with 3-0 Vicryl in  the subcutaneous and subcuticular tissue.  Patient had a 2+ radial pulse.  Patient was transferred to the recovery room in stable condition   [name], M.D., [facility] 08/09/2018 3:48 PM

## 2018-08-09 NOTE — Transfer of Care (Signed)
Immediate Anesthesia Transfer of Care Note  Patient: Kelli [name]  Procedure(s) Performed: BASILIC VEIN TRANSPOSITION 1st Stage (Right Arm Upper)  Patient Location: PACU  Anesthesia Type:MAC  Level of Consciousness: awake, alert , oriented and patient cooperative  Airway & Oxygen Therapy: Patient Spontanous Breathing and Patient connected to nasal cannula oxygen  Post-op Assessment: Report given to RN and Post -op Vital signs reviewed and stable  Post vital signs: Reviewed and stable  Last Vitals:  Vitals Value Taken Time  BP 105/78 08/09/2018  1:48 PM  Temp    Pulse 80 08/09/2018  1:50 PM  Resp 13 08/09/2018  1:50 PM  SpO2 94 % 08/09/2018  1:50 PM  Vitals shown include unvalidated device data.  Last Pain:  Vitals:   08/09/18 1348  TempSrc:   PainSc: (P) 0-No pain      Patients Stated Pain Goal: 0 (70/[id number]/37 [id number])  Complications: No apparent anesthesia complications

## 2018-08-09 NOTE — Anesthesia Preprocedure Evaluation (Signed)
Anesthesia Evaluation  Patient identified by MRN, date of birth, ID band Patient awake    Reviewed: Allergy & Precautions, NPO status , [name] Chart, lab work & pertinent test results, reviewed documented beta blocker date and time   Airway Mallampati: II  TM Distance: >3 FB Neck ROM: Full    Dental  (+) Poor Dentition, Caps, Missing   Pulmonary shortness of breath, with exertion, at rest and lying, pneumonia, resolved, former smoker,    Pulmonary exam normal breath sounds clear to auscultation       Cardiovascular hypertension, Pt. on medications and Pt. on home beta blockers + Peripheral Vascular Disease and +CHF  Normal cardiovascular exam+ pacemaker  Rhythm:Regular Rate:Normal     Neuro/Psych PSYCHIATRIC DISORDERS Anxiety Depression Left hemiplegia TIA Neuromuscular disease CVA, Residual Symptoms    GI/Hepatic Neg liver ROS, PUD,   Endo/Other  diabetes, Well Controlled, Type 2, Insulin DependentHyperlipidemia  Renal/GU Dialysis and ESRFRenal diseaseLast dialysis yesterday  negative genitourinary   Musculoskeletal  (+) Fibromyalgia -  Abdominal   Peds  Hematology  (+) anemia , Thrombocytopenia Plavix therapy- last dose 08/07/2018   Anesthesia Other Findings   Reproductive/Obstetrics                             Anesthesia Physical Anesthesia Plan  ASA: IV  Anesthesia Plan: MAC   Post-op Pain Management:    Induction: Intravenous  PONV Risk Score and Plan: 2 and Propofol infusion and Ondansetron  Airway Management Planned: Natural Airway, Nasal Cannula and Simple Face Mask  Additional Equipment:   Intra-op Plan:   Post-operative Plan:   Informed Consent: I have reviewed the patients History and Physical, chart, labs and discussed the procedure including the risks, benefits and alternatives for the proposed anesthesia with the patient or authorized representative who has  indicated his/her understanding and acceptance.     Dental advisory given  Plan Discussed with: CRNA  Anesthesia Plan Comments:         Anesthesia Quick Evaluation

## 2018-08-09 NOTE — Anesthesia Postprocedure Evaluation (Signed)
Anesthesia Post Note  Patient: Kelli [name]  Procedure(s) Performed: BASILIC VEIN TRANSPOSITION 1st Stage (Right Arm Upper)     Patient location during evaluation: PACU Anesthesia Type: MAC Level of consciousness: awake and alert and oriented Pain management: pain level controlled Vital Signs Assessment: post-procedure vital signs reviewed and stable Respiratory status: spontaneous breathing, nonlabored ventilation and respiratory function stable Cardiovascular status: stable and blood pressure returned to baseline Postop Assessment: no apparent nausea or vomiting Anesthetic complications: no    Last Vitals:  Vitals:   08/09/18 1348 08/09/18 1403  BP: 105/78 116/68  Pulse: 78 82  Resp: (!) 23 18  Temp: (!) 36.3 C   SpO2: 95% 95%    Last Pain:  Vitals:   08/09/18 1408  TempSrc:   PainSc: 0-No pain                 Recardo Linn A.

## 2018-08-10 ENCOUNTER — Encounter (HOSPITAL_COMMUNITY): Payer: Self-pay | Admitting: Vascular Surgery

## 2018-08-21 ENCOUNTER — Other Ambulatory Visit: Payer: Self-pay

## 2018-08-21 DIAGNOSIS — N186 End stage renal disease: Secondary | ICD-10-CM

## 2018-08-21 DIAGNOSIS — Z992 Dependence on renal dialysis: Secondary | ICD-10-CM

## 2018-08-23 ENCOUNTER — Other Ambulatory Visit: Payer: Self-pay

## 2018-08-23 ENCOUNTER — Ambulatory Visit (INDEPENDENT_AMBULATORY_CARE_PROVIDER_SITE_OTHER): Payer: Medicare Other | Admitting: Family

## 2018-08-23 ENCOUNTER — Ambulatory Visit (HOSPITAL_COMMUNITY)
Admission: RE | Admit: 2018-08-23 | Discharge: 2018-08-23 | Disposition: A | Discharge status: Home / Self Care | Payer: 59 | Source: Ambulatory Visit | Attending: Vascular Surgery | Admitting: Vascular Surgery

## 2018-08-23 ENCOUNTER — Encounter: Payer: Self-pay | Admitting: Family

## 2018-08-23 VITALS — BP 117/82 | Temp 97.9°F | Resp 12

## 2018-08-23 DIAGNOSIS — G8194 Hemiplegia, unspecified affecting left nondominant side: Secondary | ICD-10-CM

## 2018-08-23 DIAGNOSIS — Z992 Dependence on renal dialysis: Secondary | ICD-10-CM | POA: Insufficient documentation

## 2018-08-23 DIAGNOSIS — N186 End stage renal disease: Secondary | ICD-10-CM | POA: Diagnosis not present

## 2018-08-23 DIAGNOSIS — Z8673 Personal history of transient ischemic attack (TIA), and cerebral infarction without residual deficits: Secondary | ICD-10-CM

## 2018-08-23 DIAGNOSIS — T82898A Other specified complication of vascular prosthetic devices, implants and grafts, initial encounter: Secondary | ICD-10-CM

## 2018-08-23 MED ORDER — CEPHALEXIN 500 MG PO CAPS: 500.0000 mg | ORAL_CAPSULE | Freq: Two times a day (BID) | ORAL | 0 refills | Status: DC

## 2018-08-23 NOTE — Progress Notes (Signed)
CC: Evaluation of right arm AVF with no bruit, referred by Dr. Lowanda Foster and [place] [facility] in [place] Venuti is a 68 y.o. ([birth year]) female who is s/p right first stage brachiobasilic AV fistula creation on 08-09-18 by Dr. Donnetta Hutching.  She is also s/p left CEA in 2014 by Dr. Donnetta Hutching. 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   She returns today at the request of Dr. Lowanda Foster and her dialysis center for evaluation of right upper arm AVF with no bruit. Pt denies any steal symptoms in her right upper extremity.  She denies fever or chills, states her right arm AVF incision hurts.   She dialyses T-T-S via left upper chest TDC.   She had a previous access in her left arm, seems to have been a graft.  Her husband states that she has had several TD catheter changes.  She states she does not want a thigh access.   She has left hemiparesis from a stroke in the past.  She quit smoking in 2014, smoked 1 ppd x 44 years.  She also has IDDM.    Past Medical History:  Diagnosis Date   Anemia    Anxiety    Carotid artery occlusion    Occluded RICA, status post left CEA  August 2014 - Dr. 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   CHF (congestive heart failure) (HCC)    CKD (chronic kidney disease) stage 3, GFR 30-59 ml/min (HCC)    dialysis T/Th/sa   Closed dislocation of left humerus 07/26/2013   Depression    DM (diabetes mellitus), type 2 (HCC)    Dyspnea    with exertion   Essential hypertension, benign    Fibromyalgia    Mixed hyperlipidemia    Multiple gastric ulcers    Pneumonia    Stroke ([place])    pt states she cannot walk, left arm weakness   Urinary incontinence     Social History Social History   Tobacco Use   Smoking status: Former Smoker    Packs/day: 1.00    Years: 44.00    Pack  years: 44.00    Types: Cigarettes    Quit date: 10/03/2012    Years since quitting: 5.8   Smokeless tobacco: Never Used  Substance Use Topics   Alcohol use: No    Alcohol/week: 0.0 standard drinks   Drug use: No    Family History Family History  Problem Relation Age of Onset   Diabetes Mother    Diabetes Father    Hypertension Sister    Diabetes Brother    Seizures Son    Kidney disease Maternal Grandmother    Hypertension Maternal Grandmother    Heart disease Maternal Grandfather    Diabetes Paternal Grandmother    Heart disease Paternal Grandfather     Surgical History Past Surgical History:  Procedure Laterality Date   AV FISTULA PLACEMENT Left 04/15/2017   Procedure: ARTERIOVENOUS (AV) FISTULA CREATION LEFT ARM;  Surgeon: Serafina Mitchell, MD;  Location: [facility] OR;  Service: Vascular;  Laterality: Left;   AV FISTULA PLACEMENT Left 05/09/2017   Procedure: INSERTION OF ARTERIOVENOUS (AV) GORE-TEX GRAFT LEFT UPPER ARM;  Surgeon: [name], MD;  Location: [facility] OR;  Service: Vascular;  Laterality: Left;   [place] Right 08/09/2018   Procedure: BASILIC  VEIN TRANSPOSITION 1st Stage;  Surgeon: [name], MD;  Location: [place];  Service: Vascular;  Laterality: Right;   BIOPSY  01/22/2017   Procedure: BIOPSY;  Surgeon: Danie Binder, MD;  Location: [facility] END[facility];  Service: Endoscopy;;  gastric   COMBINED HYSTERECTOMY VAGINAL W/ MMK / A&P REPAIR  1981   ENDARTERECTOMY Left 10/06/2012   Procedure: Carotid Endarterectomy with Finesse patch angioplasty;  Surgeon: [name], MD;  Location: [facility] OR;  Service: Vascular;  Laterality: Left;   ESOPHAGOGASTRODUODENOSCOPY N/A 04/26/2014   Procedure: ESOPHAGOGASTRODUODENOSCOPY (EGD);  Surgeon: Lear Ng, MD;  Location: [facility] ENDOSCOPY;  Service: Endoscopy;  Laterality: N/A;   ESOPHAGOGASTRODUODENOSCOPY N/A 01/22/2017   Procedure: ESOPHAGOGASTRODUODENOSCOPY (EGD);  Surgeon: Danie Binder, MD;   [facility];  Service: Endoscopy;  Laterality: N/A;   FLEXIBLE SIGMOIDOSCOPY N/A 01/22/2017   Procedure: FLEXIBLE SIGMOIDOSCOPY;  Surgeon: Danie Binder, MD;  [facility];  Service: Endoscopy;  Laterality: N/A;   IR CV LINE INJECTION  02/04/2017   IR CV LINE INJECTION  04/28/2018   IR CV LINE INJECTION  05/12/2018   IR CV LINE INJECTION  05/18/2018   IR FLUORO GUIDE CV LINE LEFT  01/14/2017   IR FLUORO GUIDE CV LINE LEFT  04/25/2017   IR FLUORO GUIDE CV LINE LEFT  05/13/2017   IR FLUORO GUIDE CV LINE LEFT  03/02/2018   IR FLUORO GUIDE CV LINE LEFT  04/28/2018   IR FLUORO GUIDE CV LINE LEFT  05/12/2018   IR FLUORO GUIDE CV LINE LEFT  05/18/2018   IR FLUORO GUIDE CV LINE LEFT  06/23/2018   IR FLUORO GUIDE CV LINE LEFT  08/02/2018   IR PTA VENOUS EXCEPT DIALYSIS CIRCUIT  04/28/2018   IR PTA VENOUS EXCEPT DIALYSIS CIRCUIT  05/12/2018   IR REMOVAL TUN CV CATH W/O FL  07/08/2017   IR TRANSCATH RETRIEVAL FB INCL GUIDANCE (MS)  05/12/2018   IR US GUIDE VASC ACCESS LEFT  01/14/2017   LOOP RECORDER IMPLANT  04/16/13   MDT LinQ implanted for cryptogenic stroke   [name] WITHOUT CARDIOVERSION N/A 04/16/2013   Procedure: TRANSESOPHAGEAL ECHOCARDIOGRAM ([name]);  Surgeon: Josue Hector, MD;  Location: [place];  Service: Cardiovascular;  Laterality: N/A;   THROMBECTOMY AND REVISION OF ARTERIOVENTOUS (AV) GORETEX  GRAFT Left 07/24/2017   Procedure: THROMBECTOMY OF LEFT UPPER ARM ARTERIOVENTOUS (AV) GRAFT;  Surgeon: Conrad Cook, MD;  Location: [place];  Service: Vascular;  Laterality: Left;   URETHRAL DILATION  1980's   VAGINAL HYSTERECTOMY  1981   "partial" (10/04/2012)    Allergies  Allergen Reactions   Hydrochlorothiazide Nausea And Vomiting    Current Outpatient Medications  Medication Sig Dispense Refill   acetaminophen (TYLENOL) 500 MG tablet Take 1,000 mg by mouth every 6 (six) hours as needed for moderate pain or headache.     amLODipine (NORVASC) 10 MG  tablet Take 10 mg by mouth every evening.      atorvastatin (LIPITOR) 40 MG tablet Take 40 mg by mouth every evening.      bismuth subsalicylate (PEPTO BISMOL) 262 MG/15ML suspension Take 30 mLs by mouth every 6 (six) hours as needed for indigestion.     calcitRIOL (ROCALTROL) 0.25 MCG capsule Take 1 capsule (0.25 mcg total) by mouth daily. 30 capsule 0   calcium carbonate (TUMS - DOSED IN MG ELEMENTAL CALCIUM) 500 MG chewable tablet Chew 2-3 tablets by mouth daily as needed for indigestion or heartburn.  clonazePAM (KLONOPIN) 0.5 MG tablet Take 0.5 mg by mouth daily.      clopidogrel (PLAVIX) 75 MG tablet Take 1 tablet (75 mg total) by mouth daily. (Patient taking differently: Take 75 mg by mouth every evening. )     epoetin alfa (EPOGEN,PROCRIT) 35670 UNIT/ML injection Inject 1 mL (10,000 Units total) into the vein Every Tuesday,Thursday,and Saturday with dialysis. 1 mL    hydrALAZINE (APRESOLINE) 50 MG tablet Take 1 tablet (50 mg total) by mouth 3 (three) times daily. (Patient taking differently: Take 50 mg by mouth 2 (two) times daily. ) 90 tablet 0   Insulin Glargine (BASAGLAR KWIKPEN) 100 UNIT/ML SOPN Inject 15 Units into the skin daily.     metoprolol succinate (TOPROL-XL) 100 MG 24 hr tablet Take 1 tablet (100 mg total) by mouth daily. (Patient taking differently: Take 100 mg by mouth every evening. ) 30 tablet 0   sodium chloride (OCEAN) 0.65 % SOLN nasal spray Place 1 spray into both nostrils as needed for congestion.     torsemide (DEMADEX) 20 MG tablet Take 20 mg by mouth daily.     venlafaxine XR (EFFEXOR-XR) 150 MG 24 hr capsule Take 150 mg by mouth daily with breakfast.     No current facility-administered medications for this visit.    Facility-Administered Medications Ordered in Other Visits  Medication Dose Route Frequency Provider Last Rate Last Dose   0.9 %  sodium chloride infusion   Intravenous Continuous Monia Sabal, PA-C         REVIEW OF SYSTEMS:  see HPI for pertinent positives and negatives    PHYSICAL EXAMINATION:  Vitals:   08/23/18 1552  BP: 117/82  Resp: 12  Temp: 97.9 F (36.6 C)  TempSrc: Temporal  SpO2: 96%   There is no height or weight on file to calculate BMI.  General: Obese female seated in a w/c   HEENT:  No gross abnormalities Pulmonary: Respirations are non-labored Abdomen: Soft and non-tender. Musculoskeletal: There are no major deformities.   Neurologic: Left arm is flaccid with some movement in her left shoulder, left leg strength is 1/5. Right upper and lower extremity strength at 5/5. Speech is clear, she is oriented x 3.  Skin: There are no ulcer or rashes noted. Right arm AV fistula incision has moderate erythema, no drainage, edges well proximated.  Psychiatric: The patient has normal affect. Cardiovascular: There is a regular rate and rhythm without significant murmur appreciated.   Non-Invasive Vascular Imaging  Right arm Access Duplex  (Date: 08/23/2018):  Findings: +--------------------+----------+-----------------+--------------+  AVF                  PSV (cm/s) Flow Vol (mL/min)    Comments     +--------------------+----------+-----------------+--------------+  Native artery inflow    108            96         high resistant  +--------------------+----------+-----------------+--------------+  AVF Anastomosis          28                                       +--------------------+----------+-----------------+--------------+   Summary: Arteriovenous fistula-The inflow artery demonstrated triphasic waveforms. Arteriovenous fistula-Residual lumen of less than 70mm noted. Arteriovenous fistula-No evidence of color flow or spectral Doppler noted within the outflow vein suggestive of occlusion noted.  Non-maturation.   Carotid Duplex (01-21-17): Chronic occlusion of the  right internal carotid artery. Surgical changes compatible with a left carotid endarterectomy. Mild plaque and  tortuosity in the left internal carotid artery. Estimated degree of stenosis in the left internal carotid artery is less than 50%. Retrograde flow in the left vertebral artery. 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 This appears to be a chronic finding. Antegrade flow in the right vertebral artery.    Medical Decision Making  SORIAH LEEMAN is a 68 y.o. female who is s/p right first stage brachiobasilic AV fistula creation on 08-09-18 by Dr. Donnetta Hutching.  She also has a history of left arm AV graft.  Her dialysis center and Dr. Lowanda Foster referred her for evaluation of no bruit heard at her newly created right arm AVF. She has no steal sx's in either upper extremity. She has some pain at her right arm AVF incision, and the incision has some moderate erythema, no drainage, edges of incision are well proximated.   Right arm AVF duplex today shows that the outflow vein is occluded.   I discussed with Dr. Scot Dock pt HPI, pertinent physical exam results, and right arm access duplex results. Right arm access is occluded.   Follow up with Dr. Scot Dock this coming Wednesday, 08-30-18, for discussion of what options she may have for permanent HD access. She states that she does not want a thigh access. She had a previous AVG in her left arm.  Pt dialyzes on T-T-S, she is therefore unable to see Dr. Donnetta Hutching oh is Tuesday office day.   Moderate erythema at right arm AVF incision; will start Keflex 500 mg po bid x 10 days, disp #20, 0 refills. prescription sent electronically to her pharmacy in [place].   Will need follow up carotid duplex at some point, establishing a permanent HD access seems more important at this point.    Clemon Chambers, RN, MSN, FNP-C [facility] Office: [phone]  08/23/2018, 4:17 PM  Clinic MD: Early

## 2018-08-30 ENCOUNTER — Encounter: Payer: Self-pay | Admitting: Vascular Surgery

## 2018-08-30 ENCOUNTER — Encounter: Payer: Self-pay | Admitting: *Deleted

## 2018-08-30 ENCOUNTER — Other Ambulatory Visit: Payer: Self-pay

## 2018-08-30 ENCOUNTER — Ambulatory Visit (INDEPENDENT_AMBULATORY_CARE_PROVIDER_SITE_OTHER): Payer: Medicare Other | Admitting: Vascular Surgery

## 2018-08-30 ENCOUNTER — Other Ambulatory Visit: Payer: Self-pay | Admitting: *Deleted

## 2018-08-30 VITALS — BP 123/84 | HR 70 | Temp 97.5°F | Resp 20 | Ht 65.0 in | Wt 170.0 lb

## 2018-08-30 DIAGNOSIS — N186 End stage renal disease: Secondary | ICD-10-CM

## 2018-08-30 DIAGNOSIS — Z992 Dependence on renal dialysis: Secondary | ICD-10-CM

## 2018-08-30 NOTE — H&P (View-Only) (Signed)
Patient name: Kelli [name] MRN: [id number] DOB: 02-16-1951 Sex: female  REASON FOR VISIT:   To discuss options for access.  HPI:   Kelli [name] is a pleasant 68 y.o. female who was evaluated for hemodialysis access on 08/09/2018 by Dr. Donnetta Hutching.  Of note she has had a previous stroke and her left arm is flaccid.  She had undergone a basilic vein fistula in the past which failed and also an upper arm graft which failed.  She had previously refused access in her right arm as this was her only functional arm.  Duplex scan at the time of this visit showed small cephalic veins in the right arm but a good caliber basilic vein.  On 08/09/2018 the patient underwent a first stage basilic vein transposition by Dr. Donnetta Hutching.  The patient was seen in our office by the nurse practitioner 2 weeks later and the fistula was clotted and the patient also had cellulitis.  She was started on antibiotics and comes in for a follow-up visit to discuss new access.  She has no specific complaints today.  The redness in her right arm has improved.  Current Outpatient Medications  Medication Sig Dispense Refill  . acetaminophen (TYLENOL) 500 MG tablet Take 1,000 mg by mouth every 6 (six) hours as needed for moderate pain or headache.    Marland Kitchen amLODipine (NORVASC) 10 MG tablet Take 10 mg by mouth every evening.     Marland Kitchen atorvastatin (LIPITOR) 40 MG tablet Take 40 mg by mouth every evening.     . bismuth subsalicylate (PEPTO BISMOL) 262 MG/15ML suspension Take 30 mLs by mouth every 6 (six) hours as needed for indigestion.    . calcitRIOL (ROCALTROL) 0.25 MCG capsule Take 1 capsule (0.25 mcg total) by mouth daily. 30 capsule 0  . calcium carbonate (TUMS - DOSED IN MG ELEMENTAL CALCIUM) 500 MG chewable tablet Chew 2-3 tablets by mouth daily as needed for indigestion or heartburn.    . cephALEXin (KEFLEX) 500 MG capsule Take 1 capsule (500 mg total) by mouth 2 (two) times daily. 20 capsule 0  . clonazePAM (KLONOPIN) 0.5 MG tablet Take 0.5  mg by mouth daily.     . clopidogrel (PLAVIX) 75 MG tablet Take 1 tablet (75 mg total) by mouth daily. (Patient taking differently: Take 75 mg by mouth every evening. )    . epoetin alfa (EPOGEN,PROCRIT) 25956 UNIT/ML injection Inject 1 mL (10,000 Units total) into the vein Every Tuesday,Thursday,and Saturday with dialysis. 1 mL   . hydrALAZINE (APRESOLINE) 50 MG tablet Take 1 tablet (50 mg total) by mouth 3 (three) times daily. (Patient taking differently: Take 50 mg by mouth 2 (two) times daily. ) 90 tablet 0  . Insulin Glargine (BASAGLAR KWIKPEN) 100 UNIT/ML SOPN Inject 15 Units into the skin daily.    . metoprolol succinate (TOPROL-XL) 100 MG 24 hr tablet Take 1 tablet (100 mg total) by mouth daily. (Patient taking differently: Take 100 mg by mouth every evening. ) 30 tablet 0  . sodium chloride (OCEAN) 0.65 % SOLN nasal spray Place 1 spray into both nostrils as needed for congestion.    . torsemide (DEMADEX) 20 MG tablet Take 20 mg by mouth daily.    Marland Kitchen venlafaxine XR (EFFEXOR-XR) 150 MG 24 hr capsule Take 150 mg by mouth daily with breakfast.     No current facility-administered medications for this visit.    Facility-Administered Medications Ordered in Other Visits  Medication Dose Route Frequency Provider Last Rate Last  Dose  . 0.9 %  sodium chloride infusion   Intravenous Continuous Monia Sabal, PA-C        REVIEW OF SYSTEMS:  [X]  denotes positive finding, [ ]  denotes negative finding Vascular    Leg swelling    Cardiac    Chest pain or chest pressure:    Shortness of breath upon exertion:    Short of breath when lying flat:    Irregular heart rhythm:    Constitutional    Fever or chills:     PHYSICAL EXAM:   Vitals:   08/30/18 1054  BP: 123/84  Pulse: 70  Resp: 20  Temp: (!) 97.5 F (36.4 C)  SpO2: 96%  Weight: 170 lb (77.1 kg)  Height: 5\' 5"  (1.651 m)    GENERAL: The patient is a well-nourished female, in no acute distress. The vital signs are documented above.  CARDIOVASCULAR: There is a regular rate and rhythm. PULMONARY: There is good air exchange bilaterally without wheezing or rales. There is no thrill or bruit in her right basilic vein transposition. She has a normal right radial pulse.  DATA:   ARTERIAL DUPLEX: I reviewed the arterial duplex that was done on 05/30/2018.  The brachial artery on the right measured 0.56 cm in diameter.  There was a biphasic radial and ulnar signal with the Doppler.  MEDICAL ISSUES:   END-STAGE RENAL DISEASE: The patient has a thrombosed right basilic vein transposition.  I think the next logical option for access would be a an upper arm right AV graft.  I did look with the SonoSite at her veins myself and they do appear quite small even in the axilla.  However she at this point does not want to consider a thigh graft regardless.  Therefore I have recommended we place a right upper arm graft.  She dialyzes Tuesdays Thursdays and Saturdays and this has been scheduled for Monday, 09/04/2018.  All of her questions were answered and she is agreeable to proceed.  Deitra Mayo [facility] [phone]

## 2018-08-30 NOTE — Progress Notes (Signed)
Patient name: Kelli [name] MRN: [id number] DOB: 1951/01/27 Sex: female  REASON FOR VISIT:   To discuss options for access.  HPI:   Kelli [name] is a pleasant 68 y.o. female who was evaluated for hemodialysis access on 08/09/2018 by Dr. Donnetta Hutching.  Of note she has had a previous stroke and her left arm is flaccid.  She had undergone a basilic vein fistula in the past which failed and also an upper arm graft which failed.  She had previously refused access in her right arm as this was her only functional arm.  Duplex scan at the time of this visit showed small cephalic veins in the right arm but a good caliber basilic vein.  On 08/09/2018 the patient underwent a first stage basilic vein transposition by Dr. Donnetta Hutching.  The patient was seen in our office by the nurse practitioner 2 weeks later and the fistula was clotted and the patient also had cellulitis.  She was started on antibiotics and comes in for a follow-up visit to discuss new access.  She has no specific complaints today.  The redness in her right arm has improved.  Current Outpatient Medications  Medication Sig Dispense Refill  . acetaminophen (TYLENOL) 500 MG tablet Take 1,000 mg by mouth every 6 (six) hours as needed for moderate pain or headache.    Marland Kitchen amLODipine (NORVASC) 10 MG tablet Take 10 mg by mouth every evening.     Marland Kitchen atorvastatin (LIPITOR) 40 MG tablet Take 40 mg by mouth every evening.     . bismuth subsalicylate (PEPTO BISMOL) 262 MG/15ML suspension Take 30 mLs by mouth every 6 (six) hours as needed for indigestion.    . calcitRIOL (ROCALTROL) 0.25 MCG capsule Take 1 capsule (0.25 mcg total) by mouth daily. 30 capsule 0  . calcium carbonate (TUMS - DOSED IN MG ELEMENTAL CALCIUM) 500 MG chewable tablet Chew 2-3 tablets by mouth daily as needed for indigestion or heartburn.    . cephALEXin (KEFLEX) 500 MG capsule Take 1 capsule (500 mg total) by mouth 2 (two) times daily. 20 capsule 0  . clonazePAM (KLONOPIN) 0.5 MG tablet Take 0.5  mg by mouth daily.     . clopidogrel (PLAVIX) 75 MG tablet Take 1 tablet (75 mg total) by mouth daily. (Patient taking differently: Take 75 mg by mouth every evening. )    . epoetin alfa (EPOGEN,PROCRIT) 22979 UNIT/ML injection Inject 1 mL (10,000 Units total) into the vein Every Tuesday,Thursday,and Saturday with dialysis. 1 mL   . hydrALAZINE (APRESOLINE) 50 MG tablet Take 1 tablet (50 mg total) by mouth 3 (three) times daily. (Patient taking differently: Take 50 mg by mouth 2 (two) times daily. ) 90 tablet 0  . Insulin Glargine (BASAGLAR KWIKPEN) 100 UNIT/ML SOPN Inject 15 Units into the skin daily.    . metoprolol succinate (TOPROL-XL) 100 MG 24 hr tablet Take 1 tablet (100 mg total) by mouth daily. (Patient taking differently: Take 100 mg by mouth every evening. ) 30 tablet 0  . sodium chloride (OCEAN) 0.65 % SOLN nasal spray Place 1 spray into both nostrils as needed for congestion.    . torsemide (DEMADEX) 20 MG tablet Take 20 mg by mouth daily.    Marland Kitchen venlafaxine XR (EFFEXOR-XR) 150 MG 24 hr capsule Take 150 mg by mouth daily with breakfast.     No current facility-administered medications for this visit.    Facility-Administered Medications Ordered in Other Visits  Medication Dose Route Frequency Provider Last Rate Last  Dose  . 0.9 %  sodium chloride infusion   Intravenous Continuous Monia Sabal, PA-C        REVIEW OF SYSTEMS:  [X]  denotes positive finding, [ ]  denotes negative finding Vascular    Leg swelling    Cardiac    Chest pain or chest pressure:    Shortness of breath upon exertion:    Short of breath when lying flat:    Irregular heart rhythm:    Constitutional    Fever or chills:     PHYSICAL EXAM:   Vitals:   08/30/18 1054  BP: 123/84  Pulse: 70  Resp: 20  Temp: (!) 97.5 F (36.4 C)  SpO2: 96%  Weight: 170 lb (77.1 kg)  Height: 5\' 5"  (1.651 m)    GENERAL: The patient is a well-nourished female, in no acute distress. The vital signs are documented above.  CARDIOVASCULAR: There is a regular rate and rhythm. PULMONARY: There is good air exchange bilaterally without wheezing or rales. There is no thrill or bruit in her right basilic vein transposition. She has a normal right radial pulse.  DATA:   ARTERIAL DUPLEX: I reviewed the arterial duplex that was done on 05/30/2018.  The brachial artery on the right measured 0.56 cm in diameter.  There was a biphasic radial and ulnar signal with the Doppler.  MEDICAL ISSUES:   END-STAGE RENAL DISEASE: The patient has a thrombosed right basilic vein transposition.  I think the next logical option for access would be a an upper arm right AV graft.  I did look with the SonoSite at her veins myself and they do appear quite small even in the axilla.  However she at this point does not want to consider a thigh graft regardless.  Therefore I have recommended we place a right upper arm graft.  She dialyzes Tuesdays Thursdays and Saturdays and this has been scheduled for Monday, 09/04/2018.  All of her questions were answered and she is agreeable to proceed.  Deitra Mayo [facility] [phone]

## 2018-09-05 ENCOUNTER — Other Ambulatory Visit: Payer: Self-pay
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
## 2018-09-06 ENCOUNTER — Other Ambulatory Visit (HOSPITAL_COMMUNITY)
Admission: RE | Admit: 2018-09-06 | Discharge: 2018-09-06 | Disposition: A | Discharge status: Home / Self Care | Payer: 59 | Source: Ambulatory Visit | Attending: Vascular Surgery | Admitting: Vascular Surgery

## 2018-09-06 ENCOUNTER — Ambulatory Visit (HOSPITAL_COMMUNITY): Payer: 59 | Admitting: Anesthesiology

## 2018-09-06 ENCOUNTER — Encounter (HOSPITAL_COMMUNITY)
Admission: RE | Disposition: A | Discharge status: Home / Self Care | Payer: Self-pay | Source: Home / Self Care | Attending: Vascular Surgery

## 2018-09-06 ENCOUNTER — Ambulatory Visit (HOSPITAL_COMMUNITY)
Admission: RE | Admit: 2018-09-06 | Discharge: 2018-09-06 | Disposition: A | Discharge status: Home / Self Care | Payer: 59 | Attending: Vascular Surgery | Admitting: Vascular Surgery

## 2018-09-06 ENCOUNTER — Other Ambulatory Visit: Payer: Self-pay

## 2018-09-06 ENCOUNTER — Encounter (HOSPITAL_COMMUNITY): Payer: Self-pay

## 2018-09-06 DIAGNOSIS — N186 End stage renal disease: Secondary | ICD-10-CM | POA: Diagnosis not present

## 2018-09-06 DIAGNOSIS — Z8711 Personal history of peptic ulcer disease: Secondary | ICD-10-CM | POA: Diagnosis not present

## 2018-09-06 DIAGNOSIS — Z8673 Personal history of transient ischemic attack (TIA), and cerebral infarction without residual deficits: Secondary | ICD-10-CM | POA: Diagnosis not present

## 2018-09-06 DIAGNOSIS — N185 Chronic kidney disease, stage 5: Secondary | ICD-10-CM | POA: Diagnosis not present

## 2018-09-06 DIAGNOSIS — E1351 Other specified diabetes mellitus with diabetic peripheral angiopathy without gangrene: Secondary | ICD-10-CM | POA: Diagnosis not present

## 2018-09-06 DIAGNOSIS — N319 Neuromuscular dysfunction of bladder, unspecified: Secondary | ICD-10-CM | POA: Insufficient documentation

## 2018-09-06 DIAGNOSIS — Z1159 Encounter for screening for other viral diseases: Secondary | ICD-10-CM | POA: Insufficient documentation

## 2018-09-06 DIAGNOSIS — Z7902 Long term (current) use of antithrombotics/antiplatelets: Secondary | ICD-10-CM | POA: Insufficient documentation

## 2018-09-06 DIAGNOSIS — I509 Heart failure, unspecified: Secondary | ICD-10-CM | POA: Insufficient documentation

## 2018-09-06 DIAGNOSIS — Z79899 Other long term (current) drug therapy: Secondary | ICD-10-CM | POA: Diagnosis not present

## 2018-09-06 DIAGNOSIS — Z87891 Personal history of nicotine dependence: Secondary | ICD-10-CM | POA: Insufficient documentation

## 2018-09-06 DIAGNOSIS — Z992 Dependence on renal dialysis: Secondary | ICD-10-CM | POA: Diagnosis not present

## 2018-09-06 DIAGNOSIS — Z794 Long term (current) use of insulin: Secondary | ICD-10-CM | POA: Insufficient documentation

## 2018-09-06 DIAGNOSIS — I132 Hypertensive heart and chronic kidney disease with heart failure and with stage 5 chronic kidney disease, or end stage renal disease: Secondary | ICD-10-CM | POA: Insufficient documentation

## 2018-09-06 DIAGNOSIS — M797 Fibromyalgia: Secondary | ICD-10-CM | POA: Insufficient documentation

## 2018-09-06 DIAGNOSIS — E1122 Type 2 diabetes mellitus with diabetic chronic kidney disease: Secondary | ICD-10-CM | POA: Insufficient documentation

## 2018-09-06 HISTORY — PX: AV FISTULA PLACEMENT: SHX1204

## 2018-09-06 LAB — GLUCOSE, CAPILLARY
Glucose-Capillary: 204 mg/dL — ABNORMAL HIGH (ref 70–99)
Glucose-Capillary: 177 mg/dL — ABNORMAL HIGH (ref 70–99)

## 2018-09-06 LAB — POCT I-STAT 4, (NA,K, GLUC, HGB,HCT)
Glucose, Bld: 218 mg/dL — ABNORMAL HIGH (ref 70–99)
HCT: 28 % — ABNORMAL LOW (ref 36.0–46.0)
Hemoglobin: 9.5 g/dL — ABNORMAL LOW (ref 12.0–15.0)
Potassium: 3.4 mmol/L — ABNORMAL LOW (ref 3.5–5.1)
Sodium: 136 mmol/L (ref 135–145)

## 2018-09-06 LAB — SARS CORONAVIRUS 2 BY RT PCR (HOSPITAL ORDER, PERFORMED IN ~~LOC~~ HOSPITAL LAB): SARS Coronavirus 2: NEGATIVE

## 2018-09-06 SURGERY — INSERTION OF ARTERIOVENOUS (AV) GORE-TEX GRAFT ARM
Anesthesia: General | Site: Arm Upper | Laterality: Right

## 2018-09-06 MED ORDER — ACETAMINOPHEN 500 MG PO TABS
1000.0000 mg | ORAL_TABLET | Freq: Once | ORAL | Status: AC
  Administered 2018-09-06: 10:00:00 1000 mg via ORAL
  Filled 2018-09-06: qty 2

## 2018-09-06 MED ORDER — CHLORHEXIDINE GLUCONATE 4 % EX LIQD: 60.0000 mL | Freq: Once | CUTANEOUS | Status: DC

## 2018-09-06 MED ORDER — MIDAZOLAM HCL 2 MG/2ML IJ SOLN
INTRAMUSCULAR | Status: AC
  Filled 2018-09-06: qty 2

## 2018-09-06 MED ORDER — LIDOCAINE-EPINEPHRINE 0.5 %-1:200000 IJ SOLN
INTRAMUSCULAR | Status: AC
  Filled 2018-09-06: qty 1

## 2018-09-06 MED ORDER — PROPOFOL 10 MG/ML IV BOLUS
INTRAVENOUS | Status: DC | PRN
  Administered 2018-09-06: 100 mg via INTRAVENOUS

## 2018-09-06 MED ORDER — ONDANSETRON HCL 4 MG/2ML IJ SOLN
4.0000 mg | Freq: Once | INTRAMUSCULAR | Status: AC | PRN
  Administered 2018-09-06: 14:00:00 4 mg via INTRAVENOUS

## 2018-09-06 MED ORDER — LIDOCAINE HCL (CARDIAC) PF 100 MG/5ML IV SOSY
PREFILLED_SYRINGE | INTRAVENOUS | Status: DC | PRN
  Administered 2018-09-06: 80 mg via INTRATRACHEAL

## 2018-09-06 MED ORDER — SODIUM CHLORIDE 0.9 % IV SOLN
INTRAVENOUS | Status: DC | PRN
  Administered 2018-09-06: 25 ug/min via INTRAVENOUS

## 2018-09-06 MED ORDER — PHENYLEPHRINE HCL (PRESSORS) 10 MG/ML IV SOLN
INTRAVENOUS | Status: DC | PRN
  Administered 2018-09-06 (×2): 80 ug via INTRAVENOUS
  Administered 2018-09-06: 40 ug via INTRAVENOUS

## 2018-09-06 MED ORDER — OXYCODONE-ACETAMINOPHEN 5-325 MG PO TABS: 1.0000 | ORAL_TABLET | Freq: Four times a day (QID) | ORAL | 0 refills | Status: DC | PRN

## 2018-09-06 MED ORDER — LIDOCAINE 2% (20 MG/ML) 5 ML SYRINGE
INTRAMUSCULAR | Status: AC
  Filled 2018-09-06: qty 10

## 2018-09-06 MED ORDER — CEFAZOLIN SODIUM-DEXTROSE 2-4 GM/100ML-% IV SOLN
INTRAVENOUS | Status: AC
  Filled 2018-09-06: qty 100

## 2018-09-06 MED ORDER — ONDANSETRON HCL 4 MG/2ML IJ SOLN
INTRAMUSCULAR | Status: AC
  Filled 2018-09-06: qty 4

## 2018-09-06 MED ORDER — FENTANYL CITRATE (PF) 250 MCG/5ML IJ SOLN
INTRAMUSCULAR | Status: DC | PRN
  Administered 2018-09-06: 50 ug via INTRAVENOUS
  Administered 2018-09-06: 25 ug via INTRAVENOUS

## 2018-09-06 MED ORDER — LIDOCAINE-EPINEPHRINE 0.5 %-1:200000 IJ SOLN
INTRAMUSCULAR | Status: DC | PRN
  Administered 2018-09-06: 13 mL

## 2018-09-06 MED ORDER — PHENYLEPHRINE 40 MCG/ML (10ML) SYRINGE FOR IV PUSH (FOR BLOOD PRESSURE SUPPORT)
PREFILLED_SYRINGE | INTRAVENOUS | Status: AC
  Filled 2018-09-06: qty 10

## 2018-09-06 MED ORDER — FENTANYL CITRATE (PF) 250 MCG/5ML IJ SOLN
INTRAMUSCULAR | Status: AC
  Filled 2018-09-06: qty 5

## 2018-09-06 MED ORDER — CEFAZOLIN SODIUM-DEXTROSE 2-4 GM/100ML-% IV SOLN
2.0000 g | INTRAVENOUS | Status: AC
  Administered 2018-09-06: 2 g via INTRAVENOUS
  Filled 2018-09-06: qty 100

## 2018-09-06 MED ORDER — SODIUM CHLORIDE 0.9 % IV SOLN
INTRAVENOUS | Status: DC
  Administered 2018-09-06: 10:00:00 via INTRAVENOUS

## 2018-09-06 MED ORDER — ONDANSETRON HCL 4 MG/2ML IJ SOLN
INTRAMUSCULAR | Status: AC
  Filled 2018-09-06: qty 2

## 2018-09-06 MED ORDER — SODIUM CHLORIDE 0.9 % IV SOLN
INTRAVENOUS | Status: DC | PRN
  Administered 2018-09-06: 12:00:00 500 mL

## 2018-09-06 MED ORDER — PROPOFOL 10 MG/ML IV BOLUS
INTRAVENOUS | Status: AC
  Filled 2018-09-06: qty 20

## 2018-09-06 MED ORDER — FENTANYL CITRATE (PF) 100 MCG/2ML IJ SOLN: 25.0000 ug | INTRAMUSCULAR | Status: DC | PRN

## 2018-09-06 MED ORDER — SODIUM CHLORIDE 0.9 % IV SOLN
INTRAVENOUS | Status: AC
  Filled 2018-09-06: qty 1.2

## 2018-09-06 MED ORDER — 0.9 % SODIUM CHLORIDE (POUR BTL) OPTIME
TOPICAL | Status: DC | PRN
  Administered 2018-09-06: 11:00:00 1000 mL

## 2018-09-06 SURGICAL SUPPLY — 36 items
ADH SKN CLS APL DERMABOND .7 (GAUZE/BANDAGES/DRESSINGS) ×1
ARMBAND PINK RESTRICT EXTREMIT (MISCELLANEOUS) ×4 IMPLANT
CANISTER SUCT 3000ML PPV (MISCELLANEOUS) ×3 IMPLANT
CANNULA VESSEL 3MM 2 BLNT TIP (CANNULA) ×3 IMPLANT
CLIP LIGATING EXTRA MED SLVR (CLIP) ×3 IMPLANT
CLIP LIGATING EXTRA SM BLUE (MISCELLANEOUS) ×3 IMPLANT
COVER WAND RF STERILE (DRAPES) ×3 IMPLANT
DECANTER SPIKE VIAL GLASS SM (MISCELLANEOUS) ×3 IMPLANT
DERMABOND ADVANCED (GAUZE/BANDAGES/DRESSINGS) ×2
DERMABOND ADVANCED .7 DNX12 (GAUZE/BANDAGES/DRESSINGS) ×1 IMPLANT
ELECT REM PT RETURN 9FT ADLT (ELECTROSURGICAL) ×3
ELECTRODE REM PT RTRN 9FT ADLT (ELECTROSURGICAL) ×1 IMPLANT
GAUZE SPONGE 4X4 12PLY STRL (GAUZE/BANDAGES/DRESSINGS) ×3 IMPLANT
GLOVE BIOGEL PI IND STRL 6.5 (GLOVE) IMPLANT
GLOVE BIOGEL PI INDICATOR 6.5 (GLOVE) ×8
GLOVE ECLIPSE 6.5 STRL STRAW (GLOVE) ×2 IMPLANT
GLOVE SS BIOGEL STRL SZ 7.5 (GLOVE) ×1 IMPLANT
GLOVE SUPERSENSE BIOGEL SZ 7.5 (GLOVE) ×2
GLOVE SURG SS PI 6.5 STRL IVOR (GLOVE) ×2 IMPLANT
GOWN STRL NON-REIN LRG LVL3 (GOWN DISPOSABLE) ×2 IMPLANT
GOWN STRL REUS W/ TWL LRG LVL3 (GOWN DISPOSABLE) ×3 IMPLANT
GOWN STRL REUS W/TWL LRG LVL3 (GOWN DISPOSABLE) ×12
GRAFT GORETEX STRT 4-7X45 (Vascular Products) ×2 IMPLANT
KIT BASIN OR (CUSTOM PROCEDURE TRAY) ×3 IMPLANT
KIT TURNOVER KIT B (KITS) ×3 IMPLANT
NS IRRIG 1000ML POUR BTL (IV SOLUTION) ×3 IMPLANT
PACK CV ACCESS (CUSTOM PROCEDURE TRAY) ×3 IMPLANT
PAD ARMBOARD 7.5X6 YLW CONV (MISCELLANEOUS) ×6 IMPLANT
SUT PROLENE 6 0 CC (SUTURE) ×6 IMPLANT
SUT SILK 2 0 PERMA HAND 18 BK (SUTURE) ×2 IMPLANT
SUT VIC AB 3-0 SH 27 (SUTURE) ×6
SUT VIC AB 3-0 SH 27X BRD (SUTURE) ×2 IMPLANT
SYR TOOMEY 50ML (SYRINGE) IMPLANT
TOWEL GREEN STERILE (TOWEL DISPOSABLE) ×3 IMPLANT
UNDERPAD 30X30 (UNDERPADS AND DIAPERS) ×3 IMPLANT
WATER STERILE IRR 1000ML POUR (IV SOLUTION) ×3 IMPLANT

## 2018-09-06 NOTE — Anesthesia Procedure Notes (Signed)
Procedure Name: LMA Insertion Date/Time: 09/06/2018 11:15 AM Performed by: Neldon Newport, CRNA Pre-anesthesia Checklist: Timeout performed, Patient being monitored, Suction available, Emergency Drugs available and Patient identified Patient Re-evaluated:Patient Re-evaluated prior to induction Oxygen Delivery Method: Circle system utilized Preoxygenation: Pre-oxygenation with 100% oxygen Induction Type: IV induction Ventilation: Mask ventilation without difficulty LMA: LMA inserted LMA Size: 4.0 Number of attempts: 1 Placement Confirmation: breath sounds checked- equal and bilateral and positive ETCO2 Tube secured with: Tape Dental Injury: Teeth and Oropharynx as per pre-operative assessment

## 2018-09-06 NOTE — Anesthesia Preprocedure Evaluation (Addendum)
Anesthesia Evaluation  Patient identified by MRN, date of birth, ID band Patient awake    Reviewed: Allergy & Precautions, NPO status , [name] Chart, lab work & pertinent test results, reviewed documented beta blocker date and time   Airway Mallampati: III  TM Distance: >3 FB Neck ROM: Full    Dental  (+) Teeth Intact, Dental Advisory Given   Pulmonary shortness of breath, former smoker,    Pulmonary exam normal breath sounds clear to auscultation       Cardiovascular hypertension, Pt. on medications and Pt. on home beta blockers + Peripheral Vascular Disease and +CHF  Normal cardiovascular exam+ pacemaker  Rhythm:Regular Rate:Normal  Echo 01/08/2017: Study Conclusions  - Left ventricle: The cavity size was normal. Systolic function was normal. The estimated ejection fraction was in the range of 55% to 60%. Wall motion was normal; there were no regional wall motion abnormalities. The study is not technically sufficient to allow evaluation of LV diastolic function due to tachycardia and E/a fusion. Moderate to severe left ventricular hypertrophy. - Aortic valve: Trileaflet; mildly thickened, mildly calcified   leaflets. Sclerosis without stenosis. There was no stenosis. - Mitral valve: Calcified annulus. - Left atrium: The atrium was mildly dilated. - Atrial septum: No defect or patent foramen ovale was identified.   Neuro/Psych TIA Neuromuscular disease CVA, Residual Symptoms    GI/Hepatic Neg liver ROS, PUD,   Endo/Other  diabetes, Type 2, Insulin Dependent  Renal/GU ESRF and DialysisRenal disease (TTHSAT) Bladder dysfunction      Musculoskeletal  (+) Fibromyalgia -  Abdominal   Peds  Hematology  (+) Blood dyscrasia (Plavix), anemia ,   Anesthesia Other Findings Day of surgery medications reviewed with the patient.  Reproductive/Obstetrics                            Anesthesia  Physical Anesthesia Plan  ASA: III  Anesthesia Plan: General   Post-op Pain Management:    Induction: Intravenous  PONV Risk Score and Plan: 3 and Dexamethasone, Ondansetron and Midazolam  Airway Management Planned: LMA  Additional Equipment:   Intra-op Plan:   Post-operative Plan: Extubation in OR  Informed Consent: I have reviewed the patients History and Physical, chart, labs and discussed the procedure including the risks, benefits and alternatives for the proposed anesthesia with the patient or authorized representative who has indicated his/her understanding and acceptance.     Dental advisory given  Plan Discussed with: CRNA  Anesthesia Plan Comments:         Anesthesia Quick Evaluation

## 2018-09-06 NOTE — Anesthesia Postprocedure Evaluation (Signed)
Anesthesia Post Note  Patient: Kelli [name]  Procedure(s) Performed: INSERTION OF ARTERIOVENOUS (AV) GORE-TEX GRAFT RIGHT  ARM (Right Arm Upper)     Patient location during evaluation: PACU Anesthesia Type: General Level of consciousness: awake and alert Pain management: pain level controlled Vital Signs Assessment: post-procedure vital signs reviewed and stable Respiratory status: spontaneous breathing, nonlabored ventilation, respiratory function stable and patient connected to nasal cannula oxygen Cardiovascular status: blood pressure returned to baseline and stable Postop Assessment: no apparent nausea or vomiting Anesthetic complications: no    Last Vitals:  Vitals:   09/06/18 1330 09/06/18 1345  BP: 116/73 118/75  Pulse: 79 80  Resp: 15 16  Temp: 36.5 C   SpO2: 92% 94%    Last Pain:  Vitals:   09/06/18 1330  TempSrc:   PainSc: 0-No pain                 Catalina Gravel

## 2018-09-06 NOTE — Op Note (Signed)
    OPERATIVE REPORT  DATE OF SURGERY: 09/06/2018  PATIENT: Kelli [name], 69 y.o. female MRN: [id number]  DOB: May 02, 1950  PRE-OPERATIVE DIAGNOSIS: End-stage renal disease  POST-OPERATIVE DIAGNOSIS:  Same  PROCEDURE: Right upper arm AV Gore-Tex graft placement  SURGEON:  Curt Jews, M.D.  PHYSICIAN ASSISTANT: Nurse  ANESTHESIA: General  EBL: per anesthesia record  Total I/O In: -  Out: 5 [Blood:5]  BLOOD ADMINISTERED: none  DRAINS: none  SPECIMEN: none  COUNTS CORRECT:  YES  PATIENT DISPOSITION:  PACU - hemodynamically stable  PROCEDURE DETAILS: The patient was taken the operating placed supine position where the area of the right arm and right axilla were prepped and draped in usual sterile fashion.  Incision was made over the brachial incision and the old brachial fistula was exposed.  The artery was of good caliber.  The artery was encircled above and below the old fistula.  Next a separate incision was made in the axilla and was carried down to the axillary vein which was of excellent size.  A tunnel was created between the level of the antecubital space and the axilla and a 4 x 7 mm tapered graft was brought through the tunnel.  The artery was occluded above and below the old fistula anastomosis and the old fistula was excised.  The 4 mm portion of the graft was slightly spatulated at approximately 5 mm and was sewn end-to-side to the artery with a running 6-0 Prolene suture.  This anastomosis was tested and found to be adequate.  Next the axillary vein was occluded proximally distally and was opened with 11 blade simultaneous Potts scissors.  The graft was cut to the appropriate length and was spatulated and sewn end-to-side to the vein with a running 6-0 Prolene suture.  Clamps were removed and excellent thrill was noted.  Wounds irrigated with saline.  Hemostasis cautery.  Wounds were closed with 3-0 Vicryl in the subcutaneous and subcuticular tissue.  Sterile dressing was  applied and the patient was transferred to the recovery room in stable condition   [name], M.D., [facility] 09/06/2018 12:58 PM

## 2018-09-06 NOTE — Interval H&P Note (Signed)
History and Physical Interval Note:  09/06/2018 9:49 AM  Kelli [name]  has presented today for surgery, with the diagnosis of END STAGE RENAL DISEASE FOR HEMODIALYSIS ACCESS.  The various methods of treatment have been discussed with the patient and family. After consideration of risks, benefits and other options for treatment, the patient has consented to  Procedure(s): INSERTION OF ARTERIOVENOUS (AV) GORE-TEX GRAFT RIGHT  ARM (Right) as a surgical intervention.  The patient's history has been reviewed, patient examined, no change in status, stable for surgery.  I have reviewed the patient's chart and labs.  Questions were answered to the patient's satisfaction.     Curt Jews

## 2018-09-06 NOTE — Transfer of Care (Signed)
Immediate Anesthesia Transfer of Care Note  Patient: Kelli [name]  Procedure(s) Performed: INSERTION OF ARTERIOVENOUS (AV) GORE-TEX GRAFT RIGHT  ARM (Right Arm Upper)  Patient Location: PACU  Anesthesia Type:General  Level of Consciousness: sedated  Airway & Oxygen Therapy: Patient Spontanous Breathing and Patient connected to nasal cannula oxygen  Post-op Assessment: Report given to RN and Post -op Vital signs reviewed and stable  Post vital signs: Reviewed and stable  Last Vitals:  Vitals Value Taken Time  BP    Temp    Pulse 75 09/06/18 1300  Resp 14 09/06/18 1300  SpO2 99 % 09/06/18 1300  Vitals shown include unvalidated device data.  Last Pain:  Vitals:   09/06/18 1006  TempSrc:   PainSc: 0-No pain      Patients Stated Pain Goal: 0 (05/39/76 7341)  Complications: No apparent anesthesia complications

## 2018-09-06 NOTE — Discharge Instructions (Signed)
° °  [facility]  Discharge Instructions  AV Fistula or Graft Surgery for Dialysis Access  Please refer to the following instructions for your post-procedure care. Your surgeon or physician assistant will discuss any changes with you.  Activity  You may drive the day following your surgery, if you are comfortable and no longer taking prescription pain medication. Resume full activity as the soreness in your incision resolves.  Bathing/Showering  You may shower after you go home. Keep your incision dry for 48 hours. Do not soak in a bathtub, hot tub, or swim until the incision heals completely. You may not shower if you have a hemodialysis catheter.  Incision Care  Clean your incision with mild soap and water after 48 hours. Pat the area dry with a clean towel. You do not need a bandage unless otherwise instructed. Do not apply any ointments or creams to your incision. You may have skin glue on your incision. Do not peel it off. It will come off on its own in about one week. Your arm may swell a bit after surgery. To reduce swelling use pillows to elevate your arm so it is above your heart. Your doctor will tell you if you need to lightly wrap your arm with an ACE bandage.  Diet  Resume your normal diet. There are not special food restrictions following this procedure. In order to heal from your surgery, it is CRITICAL to get adequate nutrition. Your body requires vitamins, minerals, and protein. Vegetables are the best source of vitamins and minerals. Vegetables also provide the perfect balance of protein. Processed food has little nutritional value, so try to avoid this.  Medications  Resume taking all of your medications. If your incision is causing pain, you may take over-the counter pain relievers such as acetaminophen (Tylenol). If you were prescribed a stronger pain medication, please be aware these medications can cause nausea and constipation. Prevent  nausea by taking the medication with a snack or meal. Avoid constipation by drinking plenty of fluids and eating foods with high amount of fiber, such as fruits, vegetables, and grains.  Do not take Tylenol if you are taking prescription pain medications.  Follow up Your surgeon may want to see you in the office following your access surgery. If so, this will be arranged at the time of your surgery.  Please call us immediately for any of the following conditions:  Increased pain, redness, drainage (pus) from your incision site Fever of 101 degrees or higher Severe or worsening pain at your incision site Hand pain or numbness.  Reduce your risk of vascular disease:  Stop smoking. If you would like help, call [facility] at 1-800-QUIT-NOW (325)042-2812) or [place] at [place] your cholesterol Maintain a desired weight Control your diabetes Keep your blood pressure down  Dialysis  It will take several weeks to several months for your new dialysis access to be ready for use. Your surgeon will determine when it is okay to use it. Your nephrologist will continue to direct your dialysis. You can continue to use your Permcath until your new access is ready for use.   09/06/2018 Kelli [name] [id number] 1950/10/25  Surgeon(s): Early, Arvilla Meres, MD  Procedure(s): INSERTION OF ARTERIOVENOUS (AV) GORE-TEX GRAFT RIGHT  ARM  x Do not stick graft for 4 weeks    If you have any questions, please call the office at [phone].

## 2018-09-07 ENCOUNTER — Encounter (HOSPITAL_COMMUNITY): Payer: Self-pay | Admitting: Vascular Surgery

## 2018-09-18 ENCOUNTER — Encounter: Payer: 59 | Admitting: Family

## 2018-09-18 ENCOUNTER — Ambulatory Visit (HOSPITAL_COMMUNITY): Payer: 59

## 2018-09-19 ENCOUNTER — Ambulatory Visit (HOSPITAL_COMMUNITY): Payer: 59

## 2018-09-19 ENCOUNTER — Encounter: Payer: 59 | Admitting: Vascular Surgery

## 2018-09-21 ENCOUNTER — Encounter: Payer: 59 | Admitting: Family

## 2018-09-21 ENCOUNTER — Ambulatory Visit (HOSPITAL_COMMUNITY): Payer: 59

## 2018-10-06 ENCOUNTER — Other Ambulatory Visit (HOSPITAL_COMMUNITY): Payer: Self-pay | Admitting: Nephrology

## 2018-10-06 DIAGNOSIS — Z992 Dependence on renal dialysis: Secondary | ICD-10-CM

## 2018-10-06 DIAGNOSIS — N186 End stage renal disease: Secondary | ICD-10-CM

## 2018-10-11 ENCOUNTER — Other Ambulatory Visit: Payer: Self-pay

## 2018-10-11 ENCOUNTER — Ambulatory Visit (HOSPITAL_COMMUNITY)
Admission: RE | Admit: 2018-10-11 | Discharge: 2018-10-11 | Disposition: A | Discharge status: Home / Self Care | Payer: 59 | Source: Ambulatory Visit | Attending: Nephrology | Admitting: Nephrology

## 2018-10-11 ENCOUNTER — Encounter (HOSPITAL_COMMUNITY): Payer: Self-pay | Admitting: Radiology

## 2018-10-11 DIAGNOSIS — Z452 Encounter for adjustment and management of vascular access device: Secondary | ICD-10-CM | POA: Insufficient documentation

## 2018-10-11 DIAGNOSIS — N186 End stage renal disease: Secondary | ICD-10-CM | POA: Diagnosis not present

## 2018-10-11 DIAGNOSIS — Z992 Dependence on renal dialysis: Secondary | ICD-10-CM | POA: Insufficient documentation

## 2018-10-11 HISTORY — PX: IR REMOVAL TUN CV CATH W/O FL: IMG2289

## 2018-10-11 MED ORDER — LIDOCAINE HCL 1 % IJ SOLN
INTRAMUSCULAR | Status: AC
  Filled 2018-10-11: qty 20

## 2018-10-11 MED ORDER — CHLORHEXIDINE GLUCONATE 4 % EX LIQD
CUTANEOUS | Status: AC
  Filled 2018-10-11: qty 15

## 2018-10-11 NOTE — Procedures (Signed)
Successful removal of tunneled (L)IJ HD cath. No complications  Kelli Dike PA-C Interventional Radiology 10/11/2018 1:00 PM

## 2018-11-06 IMAGING — MR MR HEAD W/O CM
8 of 10 series · 41 of 48 positions shown · non-contrast
Comparison: CT head 01/27/2017.  MRI 01/21/2017

CLINICAL DATA: TIA were with vision change and right hand stiffness

EXAM:
MRI HEAD WITHOUT CONTRAST
TECHNIQUE: Multiplanar, multiecho pulse sequences of the brain and surrounding
structures were obtained without intravenous contrast.

[Series 3: DWI · axial · 3.0mm · 0.66mm/px · z∈[-103,+59]mm · 7 of 55 slices shown (1 of 4)]
[im 1/55]
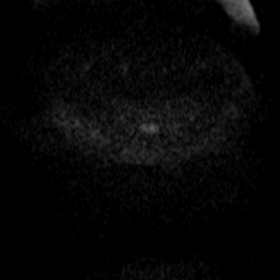
[im 10/55]
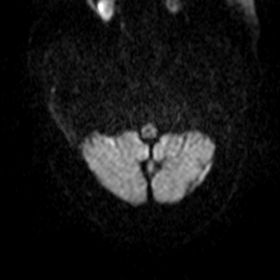
[im 19/55]
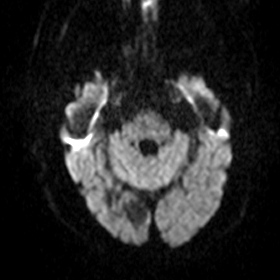
[im 28/55]
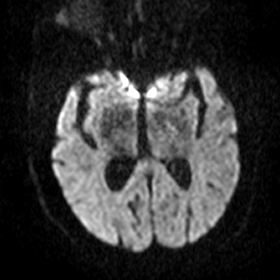
[im 37/55]
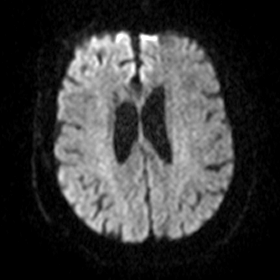
[im 46/55]
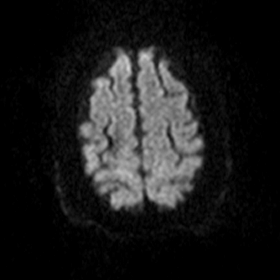
[im 55/55]
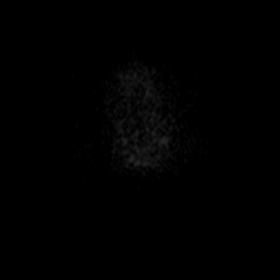

[Series 4: DWI · axial · 3.0mm · 0.74mm/px · z∈[-103,+58]mm · 7 of 55 slices shown (2 of 4)]
[im 1/55]
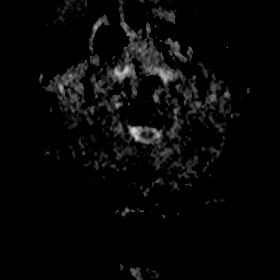
[im 10/55]
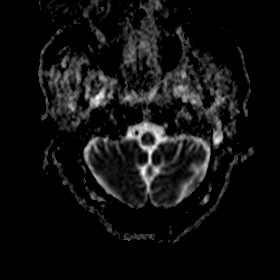
[im 19/55]
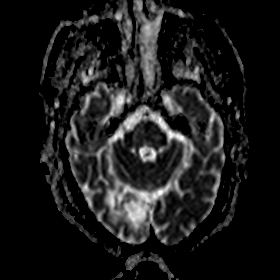
[im 28/55]
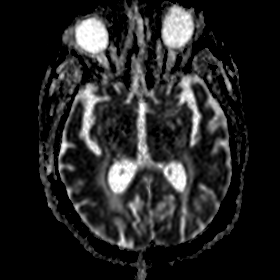
[im 37/55]
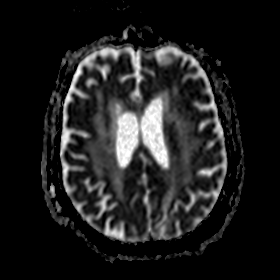
[im 46/55]
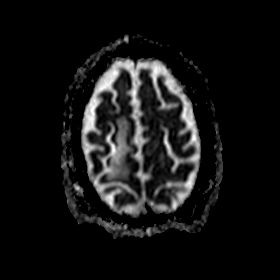
[im 55/55]
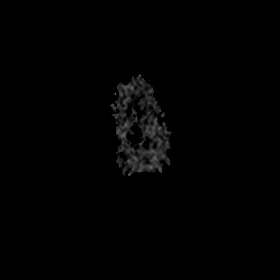

[Series 5: DWI · coronal · 5.0mm · 0.41mm/px · 4 of 37 slices shown (3 of 4)]
[im 1/37]
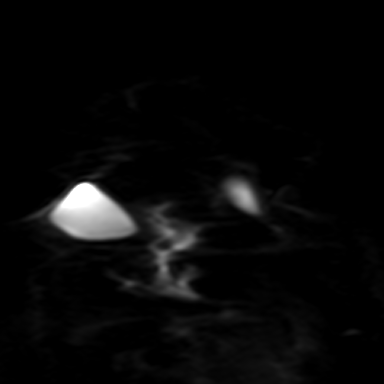
[im 13/37]
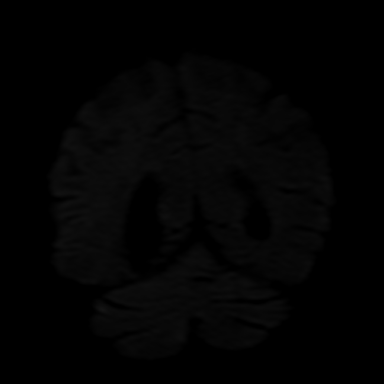
[im 25/37]
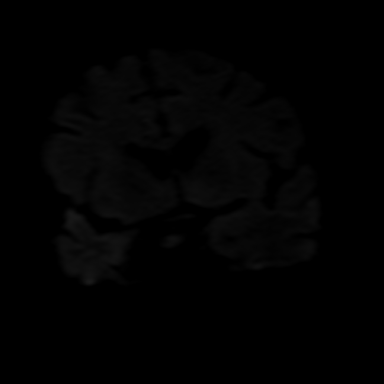
[im 37/37]
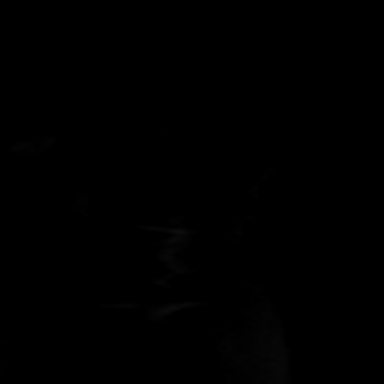

[Series 6: DWI · coronal · 5.0mm · 0.49mm/px · 4 of 34 slices shown (4 of 4)]
[im 1/34]
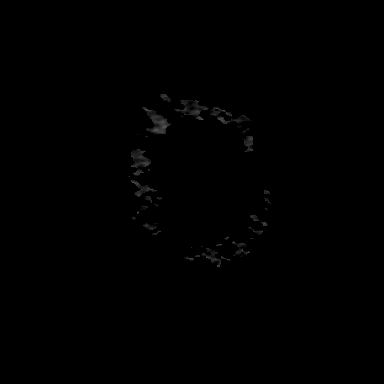
[im 12/34]
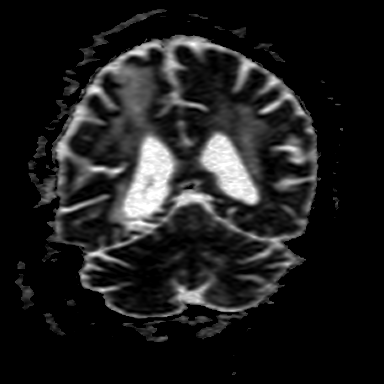
[im 23/34]
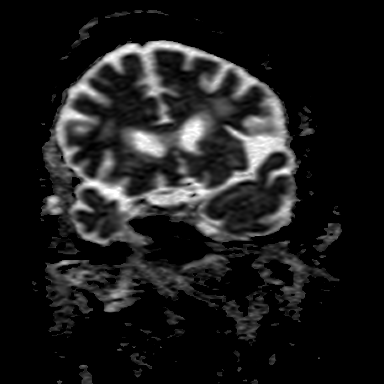
[im 34/34]
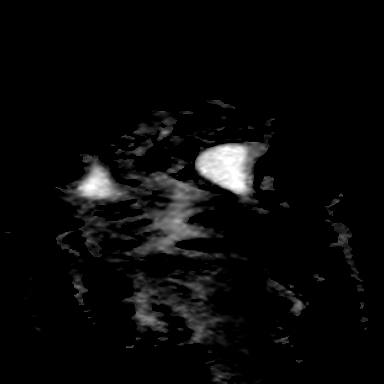

[Series 7: T2 · axial · 5.0mm · 0.64mm/px · z∈[-94,+49]mm · 3 of 23 slices shown (1 of 2)]
[im 1/23]
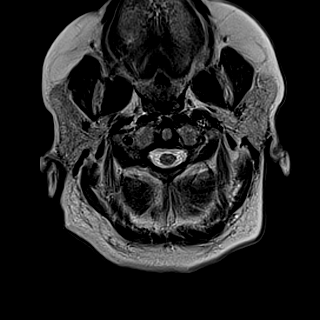
[im 12/23]
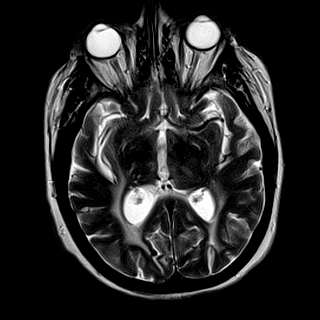
[im 23/23]
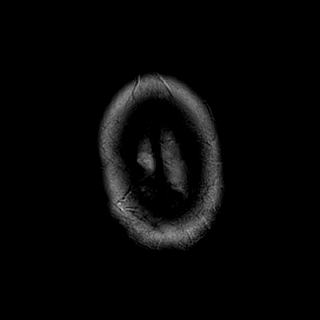

[Series 8: FLAIR · axial · 3.0mm · 0.77mm/px · z∈[-91,+46]mm · 6 of 47 slices shown]
[im 1/47]
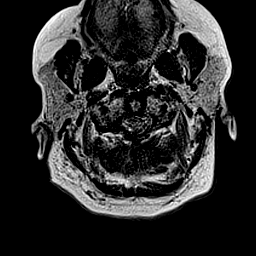
[im 10/47]
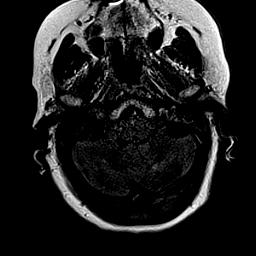
[im 19/47]
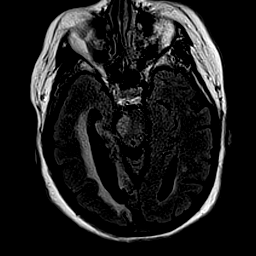
[im 28/47]
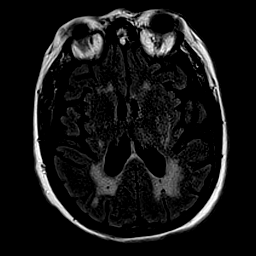
[im 37/47]
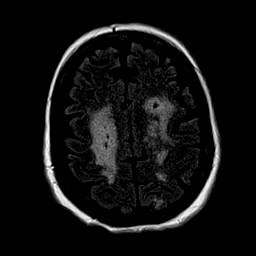
[im 47/47]
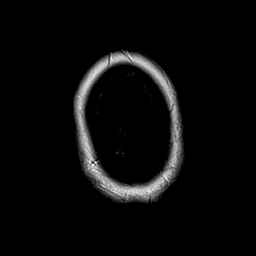

[Series 9: T1 · axial · 2.0mm · 0.40mm/px · z∈[-102,+42]mm · 7 of 83 slices shown]
[im 1/83]
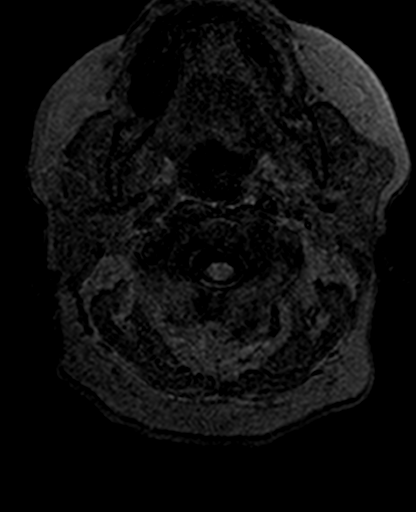
[im 10/83]
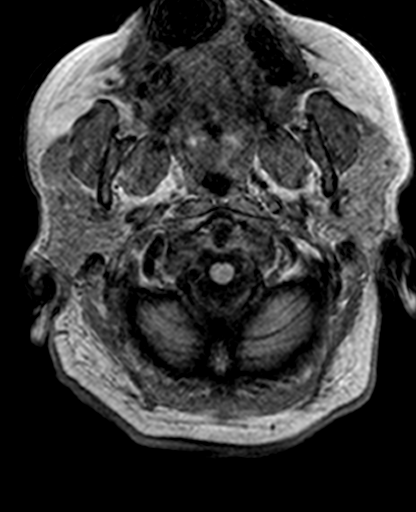
[im 28/83]
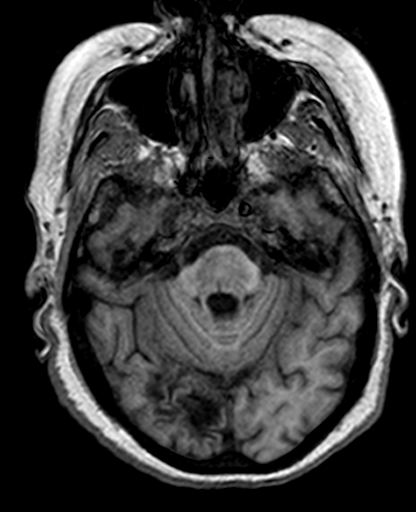
[im 37/83]
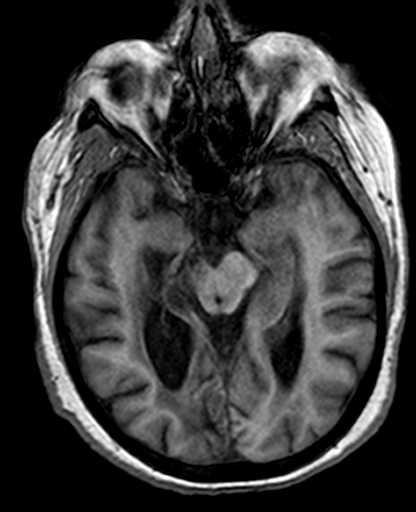
[im 46/83]
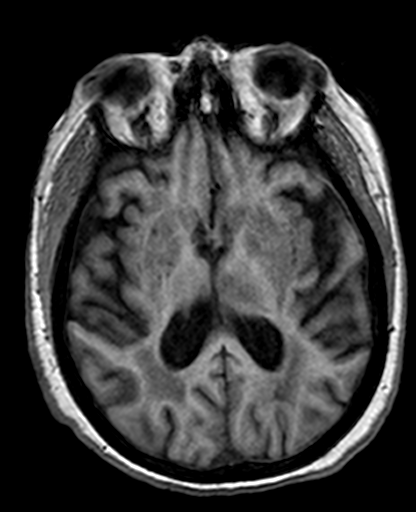
[im 55/83]
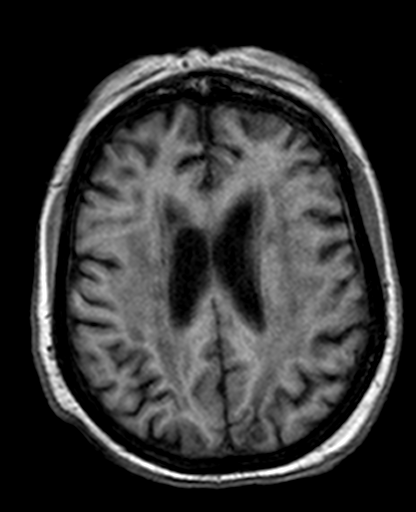
[im 73/83]
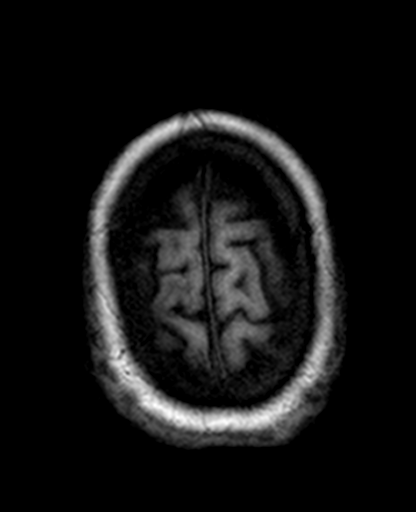

[Series 11: T2 · coronal · 5.0mm · 0.61mm/px · 3 of 28 slices shown (2 of 2)]
[im 1/28]
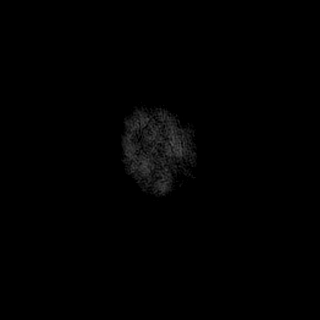
[im 14/28]
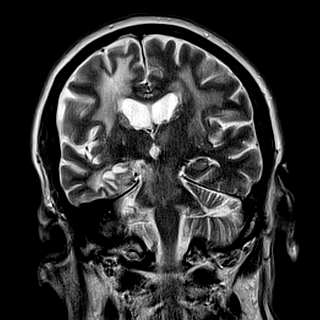
[im 28/28]
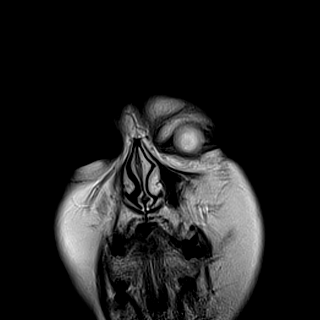

[41 of 48 positions shown; findings below may reference images not displayed]

FINDINGS: Brain: Negative for acute infarct.

Moderate atrophy. Extensive chronic ischemic changes. Extensive
chronic microvascular ischemia throughout the white matter
bilaterally. Chronic infarct in the right midbrain. Chronic ischemia
in the pons bilaterally. Chronic infarct left thalamus and basal
ganglia bilaterally. Small chronic infarct in the left cerebellum.
Chronic right occipital infarct. Chronic hemorrhagic infarct right
posterior frontal lobe. Negative for mass or edema or midline shift.

Vascular: Loss of flow void right internal carotid artery unchanged
compatible with occlusion. Otherwise normal flow voids

Skull and upper cervical spine: Negative

Sinuses/Orbits: Mild mucosal edema paranasal sinuses. Bilateral
cataract removal

Other: None
IMPRESSION: Negative for acute infarct

Atrophy and advanced chronic ischemic changes. Chronic occlusion
right internal carotid artery.

## 2019-01-29 DIAGNOSIS — N186 End stage renal disease: Secondary | ICD-10-CM | POA: Diagnosis not present

## 2019-01-29 DIAGNOSIS — Z992 Dependence on renal dialysis: Secondary | ICD-10-CM | POA: Diagnosis not present

## 2019-04-01 DIAGNOSIS — Z992 Dependence on renal dialysis: Secondary | ICD-10-CM | POA: Diagnosis not present

## 2019-04-01 DIAGNOSIS — N186 End stage renal disease: Secondary | ICD-10-CM | POA: Diagnosis not present

## 2019-04-03 DIAGNOSIS — D509 Iron deficiency anemia, unspecified: Secondary | ICD-10-CM | POA: Diagnosis not present

## 2019-04-03 DIAGNOSIS — Z992 Dependence on renal dialysis: Secondary | ICD-10-CM | POA: Diagnosis not present

## 2019-04-03 DIAGNOSIS — D631 Anemia in chronic kidney disease: Secondary | ICD-10-CM | POA: Diagnosis not present

## 2019-04-03 DIAGNOSIS — N186 End stage renal disease: Secondary | ICD-10-CM | POA: Diagnosis not present

## 2019-04-03 DIAGNOSIS — N2581 Secondary hyperparathyroidism of renal origin: Secondary | ICD-10-CM | POA: Diagnosis not present

## 2019-04-05 DIAGNOSIS — D631 Anemia in chronic kidney disease: Secondary | ICD-10-CM | POA: Diagnosis not present

## 2019-04-05 DIAGNOSIS — D509 Iron deficiency anemia, unspecified: Secondary | ICD-10-CM | POA: Diagnosis not present

## 2019-04-05 DIAGNOSIS — N186 End stage renal disease: Secondary | ICD-10-CM | POA: Diagnosis not present

## 2019-04-05 DIAGNOSIS — Z992 Dependence on renal dialysis: Secondary | ICD-10-CM | POA: Diagnosis not present

## 2019-04-05 DIAGNOSIS — N2581 Secondary hyperparathyroidism of renal origin: Secondary | ICD-10-CM | POA: Diagnosis not present

## 2019-04-07 DIAGNOSIS — N2581 Secondary hyperparathyroidism of renal origin: Secondary | ICD-10-CM | POA: Diagnosis not present

## 2019-04-07 DIAGNOSIS — N186 End stage renal disease: Secondary | ICD-10-CM | POA: Diagnosis not present

## 2019-04-07 DIAGNOSIS — Z992 Dependence on renal dialysis: Secondary | ICD-10-CM | POA: Diagnosis not present

## 2019-04-07 DIAGNOSIS — D509 Iron deficiency anemia, unspecified: Secondary | ICD-10-CM | POA: Diagnosis not present

## 2019-04-07 DIAGNOSIS — D631 Anemia in chronic kidney disease: Secondary | ICD-10-CM | POA: Diagnosis not present

## 2019-04-10 DIAGNOSIS — N2581 Secondary hyperparathyroidism of renal origin: Secondary | ICD-10-CM | POA: Diagnosis not present

## 2019-04-10 DIAGNOSIS — N186 End stage renal disease: Secondary | ICD-10-CM | POA: Diagnosis not present

## 2019-04-10 DIAGNOSIS — D509 Iron deficiency anemia, unspecified: Secondary | ICD-10-CM | POA: Diagnosis not present

## 2019-04-10 DIAGNOSIS — D631 Anemia in chronic kidney disease: Secondary | ICD-10-CM | POA: Diagnosis not present

## 2019-04-10 DIAGNOSIS — Z992 Dependence on renal dialysis: Secondary | ICD-10-CM | POA: Diagnosis not present

## 2019-04-12 DIAGNOSIS — D509 Iron deficiency anemia, unspecified: Secondary | ICD-10-CM | POA: Diagnosis not present

## 2019-04-12 DIAGNOSIS — N186 End stage renal disease: Secondary | ICD-10-CM | POA: Diagnosis not present

## 2019-04-12 DIAGNOSIS — D631 Anemia in chronic kidney disease: Secondary | ICD-10-CM | POA: Diagnosis not present

## 2019-04-12 DIAGNOSIS — Z992 Dependence on renal dialysis: Secondary | ICD-10-CM | POA: Diagnosis not present

## 2019-04-12 DIAGNOSIS — N2581 Secondary hyperparathyroidism of renal origin: Secondary | ICD-10-CM | POA: Diagnosis not present

## 2019-04-14 DIAGNOSIS — Z992 Dependence on renal dialysis: Secondary | ICD-10-CM | POA: Diagnosis not present

## 2019-04-14 DIAGNOSIS — D509 Iron deficiency anemia, unspecified: Secondary | ICD-10-CM | POA: Diagnosis not present

## 2019-04-14 DIAGNOSIS — N186 End stage renal disease: Secondary | ICD-10-CM | POA: Diagnosis not present

## 2019-04-14 DIAGNOSIS — D631 Anemia in chronic kidney disease: Secondary | ICD-10-CM | POA: Diagnosis not present

## 2019-04-14 DIAGNOSIS — N2581 Secondary hyperparathyroidism of renal origin: Secondary | ICD-10-CM | POA: Diagnosis not present

## 2019-04-17 DIAGNOSIS — D509 Iron deficiency anemia, unspecified: Secondary | ICD-10-CM | POA: Diagnosis not present

## 2019-04-17 DIAGNOSIS — D631 Anemia in chronic kidney disease: Secondary | ICD-10-CM | POA: Diagnosis not present

## 2019-04-17 DIAGNOSIS — Z992 Dependence on renal dialysis: Secondary | ICD-10-CM | POA: Diagnosis not present

## 2019-04-17 DIAGNOSIS — N2581 Secondary hyperparathyroidism of renal origin: Secondary | ICD-10-CM | POA: Diagnosis not present

## 2019-04-17 DIAGNOSIS — N186 End stage renal disease: Secondary | ICD-10-CM | POA: Diagnosis not present

## 2019-04-18 DIAGNOSIS — D631 Anemia in chronic kidney disease: Secondary | ICD-10-CM | POA: Diagnosis not present

## 2019-04-18 DIAGNOSIS — N2581 Secondary hyperparathyroidism of renal origin: Secondary | ICD-10-CM | POA: Diagnosis not present

## 2019-04-18 DIAGNOSIS — N186 End stage renal disease: Secondary | ICD-10-CM | POA: Diagnosis not present

## 2019-04-18 DIAGNOSIS — D509 Iron deficiency anemia, unspecified: Secondary | ICD-10-CM | POA: Diagnosis not present

## 2019-04-18 DIAGNOSIS — Z992 Dependence on renal dialysis: Secondary | ICD-10-CM | POA: Diagnosis not present

## 2019-04-21 DIAGNOSIS — N186 End stage renal disease: Secondary | ICD-10-CM | POA: Diagnosis not present

## 2019-04-21 DIAGNOSIS — Z992 Dependence on renal dialysis: Secondary | ICD-10-CM | POA: Diagnosis not present

## 2019-04-21 DIAGNOSIS — D631 Anemia in chronic kidney disease: Secondary | ICD-10-CM | POA: Diagnosis not present

## 2019-04-21 DIAGNOSIS — D509 Iron deficiency anemia, unspecified: Secondary | ICD-10-CM | POA: Diagnosis not present

## 2019-04-21 DIAGNOSIS — N2581 Secondary hyperparathyroidism of renal origin: Secondary | ICD-10-CM | POA: Diagnosis not present

## 2019-04-24 DIAGNOSIS — N2581 Secondary hyperparathyroidism of renal origin: Secondary | ICD-10-CM | POA: Diagnosis not present

## 2019-04-24 DIAGNOSIS — D509 Iron deficiency anemia, unspecified: Secondary | ICD-10-CM | POA: Diagnosis not present

## 2019-04-24 DIAGNOSIS — Z992 Dependence on renal dialysis: Secondary | ICD-10-CM | POA: Diagnosis not present

## 2019-04-24 DIAGNOSIS — N186 End stage renal disease: Secondary | ICD-10-CM | POA: Diagnosis not present

## 2019-04-24 DIAGNOSIS — D631 Anemia in chronic kidney disease: Secondary | ICD-10-CM | POA: Diagnosis not present

## 2019-04-25 DIAGNOSIS — Z23 Encounter for immunization: Secondary | ICD-10-CM | POA: Diagnosis not present

## 2019-04-26 DIAGNOSIS — N186 End stage renal disease: Secondary | ICD-10-CM | POA: Diagnosis not present

## 2019-04-26 DIAGNOSIS — D631 Anemia in chronic kidney disease: Secondary | ICD-10-CM | POA: Diagnosis not present

## 2019-04-26 DIAGNOSIS — D509 Iron deficiency anemia, unspecified: Secondary | ICD-10-CM | POA: Diagnosis not present

## 2019-04-26 DIAGNOSIS — N2581 Secondary hyperparathyroidism of renal origin: Secondary | ICD-10-CM | POA: Diagnosis not present

## 2019-04-26 DIAGNOSIS — Z992 Dependence on renal dialysis: Secondary | ICD-10-CM | POA: Diagnosis not present

## 2019-04-28 DIAGNOSIS — N186 End stage renal disease: Secondary | ICD-10-CM | POA: Diagnosis not present

## 2019-04-28 DIAGNOSIS — D631 Anemia in chronic kidney disease: Secondary | ICD-10-CM | POA: Diagnosis not present

## 2019-04-28 DIAGNOSIS — N2581 Secondary hyperparathyroidism of renal origin: Secondary | ICD-10-CM | POA: Diagnosis not present

## 2019-04-28 DIAGNOSIS — Z992 Dependence on renal dialysis: Secondary | ICD-10-CM | POA: Diagnosis not present

## 2019-04-28 DIAGNOSIS — D509 Iron deficiency anemia, unspecified: Secondary | ICD-10-CM | POA: Diagnosis not present

## 2019-04-29 DIAGNOSIS — N186 End stage renal disease: Secondary | ICD-10-CM | POA: Diagnosis not present

## 2019-04-29 DIAGNOSIS — Z992 Dependence on renal dialysis: Secondary | ICD-10-CM | POA: Diagnosis not present

## 2019-05-01 DIAGNOSIS — N2581 Secondary hyperparathyroidism of renal origin: Secondary | ICD-10-CM | POA: Diagnosis not present

## 2019-05-01 DIAGNOSIS — D631 Anemia in chronic kidney disease: Secondary | ICD-10-CM | POA: Diagnosis not present

## 2019-05-01 DIAGNOSIS — N186 End stage renal disease: Secondary | ICD-10-CM | POA: Diagnosis not present

## 2019-05-01 DIAGNOSIS — D509 Iron deficiency anemia, unspecified: Secondary | ICD-10-CM | POA: Diagnosis not present

## 2019-05-01 DIAGNOSIS — Z992 Dependence on renal dialysis: Secondary | ICD-10-CM | POA: Diagnosis not present

## 2019-05-03 DIAGNOSIS — N186 End stage renal disease: Secondary | ICD-10-CM | POA: Diagnosis not present

## 2019-05-03 DIAGNOSIS — Z992 Dependence on renal dialysis: Secondary | ICD-10-CM | POA: Diagnosis not present

## 2019-05-03 DIAGNOSIS — D631 Anemia in chronic kidney disease: Secondary | ICD-10-CM | POA: Diagnosis not present

## 2019-05-03 DIAGNOSIS — D509 Iron deficiency anemia, unspecified: Secondary | ICD-10-CM | POA: Diagnosis not present

## 2019-05-03 DIAGNOSIS — N2581 Secondary hyperparathyroidism of renal origin: Secondary | ICD-10-CM | POA: Diagnosis not present

## 2019-05-05 DIAGNOSIS — D509 Iron deficiency anemia, unspecified: Secondary | ICD-10-CM | POA: Diagnosis not present

## 2019-05-05 DIAGNOSIS — N186 End stage renal disease: Secondary | ICD-10-CM | POA: Diagnosis not present

## 2019-05-05 DIAGNOSIS — D631 Anemia in chronic kidney disease: Secondary | ICD-10-CM | POA: Diagnosis not present

## 2019-05-05 DIAGNOSIS — Z992 Dependence on renal dialysis: Secondary | ICD-10-CM | POA: Diagnosis not present

## 2019-05-05 DIAGNOSIS — N2581 Secondary hyperparathyroidism of renal origin: Secondary | ICD-10-CM | POA: Diagnosis not present

## 2019-05-08 DIAGNOSIS — N2581 Secondary hyperparathyroidism of renal origin: Secondary | ICD-10-CM | POA: Diagnosis not present

## 2019-05-08 DIAGNOSIS — Z992 Dependence on renal dialysis: Secondary | ICD-10-CM | POA: Diagnosis not present

## 2019-05-08 DIAGNOSIS — D631 Anemia in chronic kidney disease: Secondary | ICD-10-CM | POA: Diagnosis not present

## 2019-05-08 DIAGNOSIS — N186 End stage renal disease: Secondary | ICD-10-CM | POA: Diagnosis not present

## 2019-05-08 DIAGNOSIS — D509 Iron deficiency anemia, unspecified: Secondary | ICD-10-CM | POA: Diagnosis not present

## 2019-05-10 DIAGNOSIS — D631 Anemia in chronic kidney disease: Secondary | ICD-10-CM | POA: Diagnosis not present

## 2019-05-10 DIAGNOSIS — D509 Iron deficiency anemia, unspecified: Secondary | ICD-10-CM | POA: Diagnosis not present

## 2019-05-10 DIAGNOSIS — N186 End stage renal disease: Secondary | ICD-10-CM | POA: Diagnosis not present

## 2019-05-10 DIAGNOSIS — Z992 Dependence on renal dialysis: Secondary | ICD-10-CM | POA: Diagnosis not present

## 2019-05-10 DIAGNOSIS — N2581 Secondary hyperparathyroidism of renal origin: Secondary | ICD-10-CM | POA: Diagnosis not present

## 2019-05-12 DIAGNOSIS — N2581 Secondary hyperparathyroidism of renal origin: Secondary | ICD-10-CM | POA: Diagnosis not present

## 2019-05-12 DIAGNOSIS — D631 Anemia in chronic kidney disease: Secondary | ICD-10-CM | POA: Diagnosis not present

## 2019-05-12 DIAGNOSIS — D509 Iron deficiency anemia, unspecified: Secondary | ICD-10-CM | POA: Diagnosis not present

## 2019-05-12 DIAGNOSIS — Z992 Dependence on renal dialysis: Secondary | ICD-10-CM | POA: Diagnosis not present

## 2019-05-12 DIAGNOSIS — N186 End stage renal disease: Secondary | ICD-10-CM | POA: Diagnosis not present

## 2019-05-15 DIAGNOSIS — N186 End stage renal disease: Secondary | ICD-10-CM | POA: Diagnosis not present

## 2019-05-15 DIAGNOSIS — N2581 Secondary hyperparathyroidism of renal origin: Secondary | ICD-10-CM | POA: Diagnosis not present

## 2019-05-15 DIAGNOSIS — D509 Iron deficiency anemia, unspecified: Secondary | ICD-10-CM | POA: Diagnosis not present

## 2019-05-15 DIAGNOSIS — D631 Anemia in chronic kidney disease: Secondary | ICD-10-CM | POA: Diagnosis not present

## 2019-05-15 DIAGNOSIS — Z992 Dependence on renal dialysis: Secondary | ICD-10-CM | POA: Diagnosis not present

## 2019-05-17 DIAGNOSIS — Z992 Dependence on renal dialysis: Secondary | ICD-10-CM | POA: Diagnosis not present

## 2019-05-17 DIAGNOSIS — N186 End stage renal disease: Secondary | ICD-10-CM | POA: Diagnosis not present

## 2019-05-17 DIAGNOSIS — N2581 Secondary hyperparathyroidism of renal origin: Secondary | ICD-10-CM | POA: Diagnosis not present

## 2019-05-17 DIAGNOSIS — D631 Anemia in chronic kidney disease: Secondary | ICD-10-CM | POA: Diagnosis not present

## 2019-05-17 DIAGNOSIS — D509 Iron deficiency anemia, unspecified: Secondary | ICD-10-CM | POA: Diagnosis not present

## 2019-05-19 DIAGNOSIS — D509 Iron deficiency anemia, unspecified: Secondary | ICD-10-CM | POA: Diagnosis not present

## 2019-05-19 DIAGNOSIS — Z992 Dependence on renal dialysis: Secondary | ICD-10-CM | POA: Diagnosis not present

## 2019-05-19 DIAGNOSIS — N2581 Secondary hyperparathyroidism of renal origin: Secondary | ICD-10-CM | POA: Diagnosis not present

## 2019-05-19 DIAGNOSIS — N186 End stage renal disease: Secondary | ICD-10-CM | POA: Diagnosis not present

## 2019-05-19 DIAGNOSIS — D631 Anemia in chronic kidney disease: Secondary | ICD-10-CM | POA: Diagnosis not present

## 2019-05-22 DIAGNOSIS — N2581 Secondary hyperparathyroidism of renal origin: Secondary | ICD-10-CM | POA: Diagnosis not present

## 2019-05-22 DIAGNOSIS — D631 Anemia in chronic kidney disease: Secondary | ICD-10-CM | POA: Diagnosis not present

## 2019-05-22 DIAGNOSIS — Z992 Dependence on renal dialysis: Secondary | ICD-10-CM | POA: Diagnosis not present

## 2019-05-22 DIAGNOSIS — E119 Type 2 diabetes mellitus without complications: Secondary | ICD-10-CM | POA: Diagnosis not present

## 2019-05-22 DIAGNOSIS — N186 End stage renal disease: Secondary | ICD-10-CM | POA: Diagnosis not present

## 2019-05-22 DIAGNOSIS — D509 Iron deficiency anemia, unspecified: Secondary | ICD-10-CM | POA: Diagnosis not present

## 2019-05-23 DIAGNOSIS — Z23 Encounter for immunization: Secondary | ICD-10-CM | POA: Diagnosis not present

## 2019-05-24 DIAGNOSIS — N2581 Secondary hyperparathyroidism of renal origin: Secondary | ICD-10-CM | POA: Diagnosis not present

## 2019-05-24 DIAGNOSIS — D631 Anemia in chronic kidney disease: Secondary | ICD-10-CM | POA: Diagnosis not present

## 2019-05-24 DIAGNOSIS — N186 End stage renal disease: Secondary | ICD-10-CM | POA: Diagnosis not present

## 2019-05-24 DIAGNOSIS — Z992 Dependence on renal dialysis: Secondary | ICD-10-CM | POA: Diagnosis not present

## 2019-05-24 DIAGNOSIS — D509 Iron deficiency anemia, unspecified: Secondary | ICD-10-CM | POA: Diagnosis not present

## 2019-05-26 DIAGNOSIS — D631 Anemia in chronic kidney disease: Secondary | ICD-10-CM | POA: Diagnosis not present

## 2019-05-26 DIAGNOSIS — N2581 Secondary hyperparathyroidism of renal origin: Secondary | ICD-10-CM | POA: Diagnosis not present

## 2019-05-26 DIAGNOSIS — Z992 Dependence on renal dialysis: Secondary | ICD-10-CM | POA: Diagnosis not present

## 2019-05-26 DIAGNOSIS — N186 End stage renal disease: Secondary | ICD-10-CM | POA: Diagnosis not present

## 2019-05-26 DIAGNOSIS — D509 Iron deficiency anemia, unspecified: Secondary | ICD-10-CM | POA: Diagnosis not present

## 2019-05-28 ENCOUNTER — Telehealth (HOSPITAL_COMMUNITY): Payer: Self-pay

## 2019-05-28 NOTE — Telephone Encounter (Signed)

## 2019-05-29 ENCOUNTER — Ambulatory Visit: Payer: 59

## 2019-05-29 DIAGNOSIS — N2581 Secondary hyperparathyroidism of renal origin: Secondary | ICD-10-CM | POA: Diagnosis not present

## 2019-05-29 DIAGNOSIS — Z992 Dependence on renal dialysis: Secondary | ICD-10-CM | POA: Diagnosis not present

## 2019-05-29 DIAGNOSIS — D631 Anemia in chronic kidney disease: Secondary | ICD-10-CM | POA: Diagnosis not present

## 2019-05-29 DIAGNOSIS — D509 Iron deficiency anemia, unspecified: Secondary | ICD-10-CM | POA: Diagnosis not present

## 2019-05-29 DIAGNOSIS — N186 End stage renal disease: Secondary | ICD-10-CM | POA: Diagnosis not present

## 2019-05-30 ENCOUNTER — Other Ambulatory Visit: Payer: Self-pay

## 2019-05-30 ENCOUNTER — Ambulatory Visit (INDEPENDENT_AMBULATORY_CARE_PROVIDER_SITE_OTHER): Payer: Medicare Other | Admitting: Physician Assistant

## 2019-05-30 VITALS — BP 118/75 | HR 81 | Temp 98.2°F

## 2019-05-30 DIAGNOSIS — Z992 Dependence on renal dialysis: Secondary | ICD-10-CM

## 2019-05-30 DIAGNOSIS — N186 End stage renal disease: Secondary | ICD-10-CM | POA: Diagnosis not present

## 2019-05-30 NOTE — Progress Notes (Signed)
Office Note     CC:  follow up Requesting Provider:  Manon Hilding, MD  HPI: Kelli [name] is a 69 y.o. ([birth year]) female who presents for follow up due to bleeding from right AV graft. She is s/p right upper arm AV Gore-Tex graft placement 09/06/18 by Dr. Donnetta Hutching.   She presents today with bleeding that has been occurring recently at the end of dialysis treatment. She is not a great historian but says that it has just been over the past several weeks. Initially she told me that it was not very much and that she was not having to stay after her treatment due to the bleeding. She did not express that pressure had to be applied or extensive dressing changes. However, she eventually said that she went home and her shirt was soaked in blood. She says that this does not happen every time. Unclear exactly the extent of bleeding. Per referral form she has had prolonged bleeding post treatment  She denies any steal symptoms. She is not having any swelling, weakness, numbness, coldness or pain in her right upper extremity  She has history of thrombosed right basilic vein fistula as well as a left arm basilic vein fistula and upper arm graft both of which also failed.   She does have history of previous stroke and her left arm remains flaccid secondary to this  The pt is on a statin for cholesterol management.  The pt is not on a daily aspirin.   Other AC: Plavix The pt  On torsemide, metoprolol and hydralazine for hypertension.   The pt is diabetic.  On insulin Tobacco hx: Former smoker, quit 2014  Past Medical History:  Diagnosis Date  . Anemia   . Anxiety   . Carotid artery occlusion    Occluded RICA, status post left CEA  August 2014 - Dr. Donnetta Hutching  . Cerebral infarction [facility]) Aug 2014   Bihemispheric watershed infarcts  . Cerebral infarction involving left cerebellar artery [facility]) Feb 2015  . CHF (congestive heart failure) ([place])   . CKD (chronic kidney disease) stage 3, GFR 30-59 ml/min    dialysis [place]  . Closed dislocation of left humerus 07/26/2013  . Depression   . DM (diabetes mellitus), type 2 ([place])   . Dyspnea    with exertion  . Essential hypertension, benign   . Fibromyalgia   . Mixed hyperlipidemia   . Multiple gastric ulcers   . Pneumonia   . Stroke [facility])    pt states she cannot walk, left arm weakness  . Urinary incontinence     Past Surgical History:  Procedure Laterality Date  . AV FISTULA PLACEMENT Left 04/15/2017   Procedure: ARTERIOVENOUS (AV) FISTULA CREATION LEFT ARM;  Surgeon: Serafina Mitchell, MD;  Location: [facility] OR;  Service: Vascular;  Laterality: Left;  . AV FISTULA PLACEMENT Left 05/09/2017   Procedure: INSERTION OF ARTERIOVENOUS (AV) GORE-TEX GRAFT LEFT UPPER ARM;  Surgeon: [name], MD;  Location: [facility] OR;  Service: Vascular;  Laterality: Left;  . AV FISTULA PLACEMENT Right 09/06/2018   Procedure: INSERTION OF ARTERIOVENOUS (AV) GORE-TEX GRAFT RIGHT  ARM;  Surgeon: [name], MD;  Location: [place];  Service: Vascular;  Laterality: Right;  . BASCILIC VEIN TRANSPOSITION Right 08/09/2018   Procedure: BASILIC VEIN TRANSPOSITION 1st Stage;  Surgeon: [name], MD;  Location: [place];  Service: Vascular;  Laterality: Right;  . BIOPSY  01/22/2017   Procedure: BIOPSY;  Surgeon: Danie Binder, MD;  Location: [facility];  Service: Endoscopy;;  gastric  . COMBINED HYSTERECTOMY VAGINAL W/ MMK / A&P REPAIR  1981  . ENDARTERECTOMY Left 10/06/2012   Procedure: Carotid Endarterectomy with Finesse patch angioplasty;  Surgeon: [name], MD;  Location: [place];  Service: Vascular;  Laterality: Left;  . ESOPHAGOGASTRODUODENOSCOPY N/A 04/26/2014   Procedure: ESOPHAGOGASTRODUODENOSCOPY (EGD);  Surgeon: Lear Ng, MD;  Location: [facility] ENDOSCOPY;  Service: Endoscopy;  Laterality: N/A;  . ESOPHAGOGASTRODUODENOSCOPY N/A 01/22/2017   Procedure: ESOPHAGOGASTRODUODENOSCOPY (EGD);  Surgeon: Danie Binder, MD;  [facility];  Service: Endoscopy;   Laterality: N/A;  . FLEXIBLE SIGMOIDOSCOPY N/A 01/22/2017   Procedure: FLEXIBLE SIGMOIDOSCOPY;  Surgeon: Danie Binder, MD;  [facility];  Service: Endoscopy;  Laterality: N/A;  . IR CV LINE INJECTION  02/04/2017  . IR CV LINE INJECTION  04/28/2018  . IR CV LINE INJECTION  05/12/2018  . IR CV LINE INJECTION  05/18/2018  . IR FLUORO GUIDE CV LINE LEFT  01/14/2017  . IR FLUORO GUIDE CV LINE LEFT  04/25/2017  . IR FLUORO GUIDE CV LINE LEFT  05/13/2017  . IR FLUORO GUIDE CV LINE LEFT  03/02/2018  . IR FLUORO GUIDE CV LINE LEFT  04/28/2018  . IR FLUORO GUIDE CV LINE LEFT  05/12/2018  . IR FLUORO GUIDE CV LINE LEFT  05/18/2018  . IR FLUORO GUIDE CV LINE LEFT  06/23/2018  . IR FLUORO GUIDE CV LINE LEFT  08/02/2018  . IR PTA VENOUS EXCEPT DIALYSIS CIRCUIT  04/28/2018  . IR PTA VENOUS EXCEPT DIALYSIS CIRCUIT  05/12/2018  . IR REMOVAL TUN CV CATH W/O FL  07/08/2017  . IR REMOVAL TUN CV CATH W/O FL  10/11/2018  . IR TRANSCATH RETRIEVAL FB INCL GUIDANCE (MS)  05/12/2018  . IR US GUIDE VASC ACCESS LEFT  01/14/2017  . LOOP RECORDER IMPLANT  04/16/13   MDT LinQ implanted for cryptogenic stroke  . [name] WITHOUT CARDIOVERSION N/A 04/16/2013   Procedure: TRANSESOPHAGEAL ECHOCARDIOGRAM ([name]);  Surgeon: Josue Hector, MD;  Location: [place];  Service: Cardiovascular;  Laterality: N/A;  . THROMBECTOMY AND REVISION OF ARTERIOVENTOUS (AV) GORETEX  GRAFT Left 07/24/2017   Procedure: THROMBECTOMY OF LEFT UPPER ARM ARTERIOVENTOUS (AV) GRAFT;  Surgeon: Conrad Windber, MD;  Location: [place];  Service: Vascular;  Laterality: Left;  . URETHRAL DILATION  1980's  . VAGINAL HYSTERECTOMY  1981   "partial" (10/04/2012)    Social History   Socioeconomic History  . Marital status: Married    Spouse name: Not on file  . Number of children: Not on file  . Years of education: Not on file  . Highest education level: Not on file  Occupational History  . Not on file  Tobacco Use  . Smoking status: Former Smoker     Packs/day: 1.00    Years: 44.00    Pack years: 44.00    Types: Cigarettes    Quit date: 10/03/2012    Years since quitting: 6.6  . Smokeless tobacco: Never Used  Substance and Sexual Activity  . Alcohol use: No    Alcohol/week: 0.0 standard drinks  . Drug use: No  . Sexual activity: Yes    Birth control/protection: Surgical  Other Topics Concern  . Not on file  Social History Narrative   INITIALLY WORKED [employer]. 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   Social Determinants of Health   Financial Resource Strain:   .  Difficulty of Paying Living Expenses:   Food Insecurity:   . Worried About Charity fundraiser in the Last Year:   . Arboriculturist in the Last Year:   Transportation Needs:   . Film/video editor (Medical):   Marland Kitchen Lack of Transportation (Non-Medical):   Physical Activity:   . Days of Exercise per Week:   . Minutes of Exercise per Session:   Stress:   . Feeling of Stress :   Social Connections:   . Frequency of Communication with Friends and Family:   . Frequency of Social Gatherings with Friends and Family:   . Attends Religious Services:   . Active Member of Clubs or Organizations:   . Attends Archivist Meetings:   Marland Kitchen Marital Status:   Intimate Partner Violence:   . Fear of Current or Ex-Partner:   . Emotionally Abused:   Marland Kitchen Physically Abused:   . Sexually Abused:     Family History  Problem Relation Age of Onset  . Diabetes Mother   . Diabetes Father   . Hypertension Sister   . Diabetes Brother   . Seizures Son   . Kidney disease Maternal Grandmother   . Hypertension Maternal Grandmother   . Heart disease Maternal Grandfather   . Diabetes Paternal Grandmother   . Heart disease Paternal Grandfather     Current Outpatient Medications  Medication Sig Dispense Refill  . acetaminophen (TYLENOL) 500 MG tablet Take 1,000 mg by mouth every 6 (six) hours as needed for moderate pain or headache.      Marland Kitchen amLODipine (NORVASC) 10 MG tablet Take 10 mg by mouth every evening.     Marland Kitchen atorvastatin (LIPITOR) 40 MG tablet Take 40 mg by mouth every evening.     . bismuth subsalicylate (PEPTO BISMOL) 262 MG/15ML suspension Take 30 mLs by mouth every 6 (six) hours as needed for indigestion.    . cephALEXin (KEFLEX) 500 MG capsule Take 1 capsule (500 mg total) by mouth 2 (two) times daily. 20 capsule 0  . clonazePAM (KLONOPIN) 0.5 MG tablet Take 0.5 mg by mouth daily.     . clopidogrel (PLAVIX) 75 MG tablet Take 1 tablet (75 mg total) by mouth daily. (Patient taking differently: Take 75 mg by mouth every evening. )    . epoetin alfa (EPOGEN,PROCRIT) 74259 UNIT/ML injection Inject 1 mL (10,000 Units total) into the vein Every Tuesday,Thursday,and Saturday with dialysis. 1 mL   . hydrALAZINE (APRESOLINE) 50 MG tablet Take 1 tablet (50 mg total) by mouth 3 (three) times daily. (Patient taking differently: Take 50 mg by mouth 2 (two) times daily. ) 90 tablet 0  . Insulin Glargine (BASAGLAR KWIKPEN) 100 UNIT/ML SOPN Inject 15 Units into the skin daily.    . metoprolol succinate (TOPROL-XL) 100 MG 24 hr tablet Take 1 tablet (100 mg total) by mouth daily. (Patient taking differently: Take 100 mg by mouth every evening. ) 30 tablet 0  . oxyCODONE-acetaminophen (PERCOCET) 5-325 MG tablet Take 1 tablet by mouth every 6 (six) hours as needed for severe pain. 20 tablet 0  . sodium chloride (OCEAN) 0.65 % SOLN nasal spray Place 1 spray into both nostrils as needed for congestion.    . torsemide (DEMADEX) 20 MG tablet Take 20 mg by mouth daily.    Marland Kitchen venlafaxine XR (EFFEXOR-XR) 150 MG 24 hr capsule Take 150 mg by mouth daily with breakfast.    . Vitamins A & D (VITAMIN A & D) ointment Apply 1  application topically as needed for dry skin (applied to affected area(s) of legs).     No current facility-administered medications for this visit.   Facility-Administered Medications Ordered in Other Visits  Medication Dose Route  Frequency Provider Last Rate Last Admin  . 0.9 %  sodium chloride infusion   Intravenous Continuous Monia Sabal, PA-C   New Bag at 09/06/18 1206    Allergies  Allergen Reactions  . Hydrochlorothiazide Nausea And Vomiting     REVIEW OF SYSTEMS:  Review of Systems  Constitutional: Negative for chills, fever and malaise/fatigue.  HENT: Negative for congestion and sore throat.   Respiratory: Negative for cough and shortness of breath.   Cardiovascular: Negative for chest pain and leg swelling.  Gastrointestinal: Negative for abdominal pain, constipation, diarrhea, nausea and vomiting.  Genitourinary: Positive for dysuria.  Musculoskeletal: Positive for joint pain (right shoulder).  Neurological: Positive for dizziness. Negative for speech change, focal weakness and headaches.  Endo/Heme/Allergies: Bruises/bleeds easily.     PHYSICAL EXAMINATION:  Vitals:   05/30/19 1129  BP: 118/75  Pulse: 81  Temp: 98.2 F (36.8 C)  SpO2: 98%    General:  WDWN in NAD; vital signs documented above Gait: Not observed HENT: WNL, normocephalic Pulmonary: normal non-labored breathing , without Rales, rhonchi,  wheezing Cardiac: regular HR, without  Murmurs without carotid bruit Abdomen: soft, NT, no masses Skin: without rashes Vascular Exam/Pulses:2+ bilateral radial pulses, upper extremities well perfused and warm. 5/5 grip strength right hand. Left upper extremity flaccid. Right upper extremity graft with good bruit and thrill. Some pulsatility in graft Extremities: without ischemic changes, without Gangrene , without cellulitis; without open wounds;  Musculoskeletal: no muscle wasting or atrophy  Neurologic: A&O X 3;  No focal weakness or paresthesias are detected Psychiatric:  The pt has Normal affect.   ASSESSMENT/PLAN:: 69 y.o. female here for follow up for prolonged bleeding following dialysis treatment.She is s/p right upper arm AV Gore-Tex graft placement 09/06/18 by Dr. Donnetta Hutching.  -  she dialyzes Tuesday, Thursday, Saturday at [facility] in [place] so she will need scheduled on a non dialysis day - considered ordering duplex of right upper extremity graft however due to uncertain extent of bleeding issues and history of multiple failed dialysis accesses discussed with her recommendation to proceed with fistulogram - I have schedule her for a right upper extremity fistulogram - she will follow up as needed after the fistulogram  Karoline Caldwell, PA-C [facility] [phone]  Clinic MD:  Dr. Oneida Alar

## 2019-06-04 ENCOUNTER — Other Ambulatory Visit: Payer: Self-pay

## 2019-06-04 ENCOUNTER — Other Ambulatory Visit (HOSPITAL_COMMUNITY)
Admission: RE | Admit: 2019-06-04 | Discharge: 2019-06-04 | Disposition: A | Discharge status: Home / Self Care | Payer: Medicare Other | Source: Ambulatory Visit | Attending: Vascular Surgery | Admitting: Vascular Surgery

## 2019-06-04 DIAGNOSIS — Z20822 Contact with and (suspected) exposure to covid-19: Secondary | ICD-10-CM | POA: Diagnosis not present

## 2019-06-04 DIAGNOSIS — Z01812 Encounter for preprocedural laboratory examination: Secondary | ICD-10-CM | POA: Diagnosis not present

## 2019-06-04 LAB — SARS CORONAVIRUS 2 (TAT 6-24 HRS): SARS Coronavirus 2: NEGATIVE

## 2019-06-06 ENCOUNTER — Other Ambulatory Visit: Payer: Self-pay

## 2019-06-06 ENCOUNTER — Ambulatory Visit (HOSPITAL_COMMUNITY)
Admission: RE | Admit: 2019-06-06 | Discharge: 2019-06-06 | Disposition: A | Discharge status: Home / Self Care | Payer: Medicare Other | Attending: Vascular Surgery | Admitting: Vascular Surgery

## 2019-06-06 ENCOUNTER — Encounter (HOSPITAL_COMMUNITY)
Admission: RE | Disposition: A | Discharge status: Home / Self Care | Payer: Self-pay | Source: Home / Self Care | Attending: Vascular Surgery

## 2019-06-06 ENCOUNTER — Encounter (HOSPITAL_COMMUNITY): Payer: Self-pay | Admitting: Vascular Surgery

## 2019-06-06 DIAGNOSIS — I6521 Occlusion and stenosis of right carotid artery: Secondary | ICD-10-CM | POA: Insufficient documentation

## 2019-06-06 DIAGNOSIS — E782 Mixed hyperlipidemia: Secondary | ICD-10-CM | POA: Insufficient documentation

## 2019-06-06 DIAGNOSIS — M797 Fibromyalgia: Secondary | ICD-10-CM | POA: Insufficient documentation

## 2019-06-06 DIAGNOSIS — N186 End stage renal disease: Secondary | ICD-10-CM

## 2019-06-06 DIAGNOSIS — Z95828 Presence of other vascular implants and grafts: Secondary | ICD-10-CM | POA: Insufficient documentation

## 2019-06-06 DIAGNOSIS — Z8673 Personal history of transient ischemic attack (TIA), and cerebral infarction without residual deficits: Secondary | ICD-10-CM | POA: Insufficient documentation

## 2019-06-06 DIAGNOSIS — Z87891 Personal history of nicotine dependence: Secondary | ICD-10-CM | POA: Diagnosis not present

## 2019-06-06 DIAGNOSIS — Z888 Allergy status to other drugs, medicaments and biological substances status: Secondary | ICD-10-CM | POA: Diagnosis not present

## 2019-06-06 DIAGNOSIS — F329 Major depressive disorder, single episode, unspecified: Secondary | ICD-10-CM | POA: Diagnosis not present

## 2019-06-06 DIAGNOSIS — T82858A Stenosis of vascular prosthetic devices, implants and grafts, initial encounter: Secondary | ICD-10-CM | POA: Diagnosis present

## 2019-06-06 DIAGNOSIS — N183 Chronic kidney disease, stage 3 unspecified: Secondary | ICD-10-CM | POA: Diagnosis not present

## 2019-06-06 DIAGNOSIS — F419 Anxiety disorder, unspecified: Secondary | ICD-10-CM | POA: Insufficient documentation

## 2019-06-06 DIAGNOSIS — Z794 Long term (current) use of insulin: Secondary | ICD-10-CM | POA: Diagnosis not present

## 2019-06-06 DIAGNOSIS — I509 Heart failure, unspecified: Secondary | ICD-10-CM | POA: Insufficient documentation

## 2019-06-06 DIAGNOSIS — Z841 Family history of disorders of kidney and ureter: Secondary | ICD-10-CM | POA: Insufficient documentation

## 2019-06-06 DIAGNOSIS — E1122 Type 2 diabetes mellitus with diabetic chronic kidney disease: Secondary | ICD-10-CM | POA: Insufficient documentation

## 2019-06-06 DIAGNOSIS — Z79899 Other long term (current) drug therapy: Secondary | ICD-10-CM | POA: Diagnosis not present

## 2019-06-06 DIAGNOSIS — I130 Hypertensive heart and chronic kidney disease with heart failure and stage 1 through stage 4 chronic kidney disease, or unspecified chronic kidney disease: Secondary | ICD-10-CM | POA: Insufficient documentation

## 2019-06-06 DIAGNOSIS — Z833 Family history of diabetes mellitus: Secondary | ICD-10-CM | POA: Insufficient documentation

## 2019-06-06 DIAGNOSIS — Z8249 Family history of ischemic heart disease and other diseases of the circulatory system: Secondary | ICD-10-CM | POA: Diagnosis not present

## 2019-06-06 DIAGNOSIS — Z992 Dependence on renal dialysis: Secondary | ICD-10-CM | POA: Insufficient documentation

## 2019-06-06 DIAGNOSIS — Z7902 Long term (current) use of antithrombotics/antiplatelets: Secondary | ICD-10-CM | POA: Diagnosis not present

## 2019-06-06 DIAGNOSIS — Y832 Surgical operation with anastomosis, bypass or graft as the cause of abnormal reaction of the patient, or of later complication, without mention of misadventure at the time of the procedure: Secondary | ICD-10-CM | POA: Diagnosis not present

## 2019-06-06 HISTORY — PX: PERIPHERAL VASCULAR INTERVENTION: CATH118257

## 2019-06-06 HISTORY — PX: A/V FISTULAGRAM: CATH118298

## 2019-06-06 LAB — POCT I-STAT, CHEM 8
BUN: 28 mg/dL — ABNORMAL HIGH (ref 8–23)
Calcium, Ion: 0.61 mmol/L — CL (ref 1.15–1.40)
Chloride: 104 mmol/L (ref 98–111)
Creatinine, Ser: 5.2 mg/dL — ABNORMAL HIGH (ref 0.44–1.00)
Glucose, Bld: 216 mg/dL — ABNORMAL HIGH (ref 70–99)
HCT: 36 % (ref 36.0–46.0)
Hemoglobin: 12.2 g/dL (ref 12.0–15.0)
Potassium: 4.2 mmol/L (ref 3.5–5.1)
Sodium: 133 mmol/L — ABNORMAL LOW (ref 135–145)
TCO2: 25 mmol/L (ref 22–32)

## 2019-06-06 SURGERY — A/V FISTULAGRAM
Anesthesia: LOCAL | Laterality: Right

## 2019-06-06 MED ORDER — LIDOCAINE HCL (PF) 1 % IJ SOLN
INTRAMUSCULAR | Status: DC | PRN
  Administered 2019-06-06: 2 mL via INTRADERMAL

## 2019-06-06 MED ORDER — SODIUM CHLORIDE 0.9% FLUSH: 3.0000 mL | Freq: Two times a day (BID) | INTRAVENOUS | Status: DC

## 2019-06-06 MED ORDER — ONDANSETRON HCL 4 MG/2ML IJ SOLN
INTRAMUSCULAR | Status: DC | PRN
  Administered 2019-06-06: 4 mg via INTRAVENOUS

## 2019-06-06 MED ORDER — IODIXANOL 320 MG/ML IV SOLN
INTRAVENOUS | Status: DC | PRN
  Administered 2019-06-06: 40 mL via INTRAVENOUS

## 2019-06-06 MED ORDER — HEPARIN (PORCINE) IN NACL 1000-0.9 UT/500ML-% IV SOLN
INTRAVENOUS | Status: DC | PRN
  Administered 2019-06-06: 500 mL

## 2019-06-06 MED ORDER — SODIUM CHLORIDE 0.9% FLUSH: 3.0000 mL | INTRAVENOUS | Status: DC | PRN

## 2019-06-06 MED ORDER — SODIUM CHLORIDE 0.9 % IV SOLN
250.0000 mL | INTRAVENOUS | Status: DC | PRN
  Administered 2019-06-06: 10:00:00 250 mL via INTRAVENOUS

## 2019-06-06 SURGICAL SUPPLY — 17 items
BAG SNAP BAND KOVER 36X36 (MISCELLANEOUS) ×3 IMPLANT
BALLN MUSTANG 6.0X40 75 (BALLOONS) ×3
BALLOON MUSTANG 6.0X40 75 (BALLOONS) IMPLANT
COVER DOME SNAP 22 D (MISCELLANEOUS) ×3 IMPLANT
DCB RANGER 7.0X60 135 (BALLOONS) IMPLANT
KIT ENCORE 26 ADVANTAGE (KITS) ×1 IMPLANT
KIT MICROPUNCTURE NIT STIFF (SHEATH) ×1 IMPLANT
PROTECTION STATION PRESSURIZED (MISCELLANEOUS) ×3
RANGER DCB 7.0X60 135 (BALLOONS) ×3
SHEATH PINNACLE R/O II 7F 4CM (SHEATH) ×1 IMPLANT
SHEATH PROBE COVER 6X72 (BAG) ×4 IMPLANT
STATION PROTECTION PRESSURIZED (MISCELLANEOUS) ×2 IMPLANT
STOPCOCK MORSE 400PSI 3WAY (MISCELLANEOUS) ×3 IMPLANT
TRAY PV CATH (CUSTOM PROCEDURE TRAY) ×3 IMPLANT
TUBING CIL FLEX 10 FLL-RA (TUBING) ×3 IMPLANT
WIRE BENTSON .035X145CM (WIRE) ×1 IMPLANT
WIRE G V18X300CM (WIRE) ×1 IMPLANT

## 2019-06-06 NOTE — Progress Notes (Signed)
Assisted pt into wheelchair after helping her dress. Unable to ambulate pt is wheelchair bond

## 2019-06-06 NOTE — Progress Notes (Addendum)
Attempted to call pt husband message left for him to call us back  Discharge instructions reviewed with pt

## 2019-06-06 NOTE — Discharge Instructions (Signed)

## 2019-06-06 NOTE — Op Note (Signed)
    OPERATIVE NOTE   PROCEDURE: 1. right upper arm arteriovenous graft cannulation under ultrasound guidance 2. right arm fistuogram including central venogram 3. right arm peripheral angioplasty at graft venous anastomosis on axillary vein (6 mm x 40 mm Mustang and 7 mm x 60 mm drug coated Ranger)  PRE-OPERATIVE DIAGNOSIS: Malfunctioning right arteriovenous graft (prolonged bleeding)  POST-OPERATIVE DIAGNOSIS: same as above   SURGEON: Marty Heck, MD  ANESTHESIA: local  ESTIMATED BLOOD LOSS: 5 cc  FINDING(S): 1. There was a focal near 90% stenosis of the venous anastomosis of the graft on the axillary vein in the upper arm.  This was angioplastied with a 6 mm Mustang and then 7 mm drug-coated Ranger.  She now has an excellent thrill with no residual stenosis.  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  SPECIMEN(S):  None  CONTRAST: 40 cc  INDICATIONS: Kelli [name] is a 69 y.o. female who  presents with malfunctioning right arteriovenous graft.  The patient is scheduled for right arm fistuloogram.  The patient is aware the risks include but are not limited to: bleeding, infection, thrombosis of the cannulated access, and possible anaphylactic reaction to the contrast.  The patient is aware of the risks of the procedure and elects to proceed forward.  DESCRIPTION: After full informed written consent was obtained, the patient was brought back to the angiography suite and placed supine upon the angiography table.  The patient was connected to monitoring equipment.  The right arm was prepped and draped in the standard fashion for a right arm fistulogram.  Under ultrasound guidance, the right arteriovenous graft was evaluated, it was patent, an image was saved.  It was cannulated under ultrasound guidance with a micropuncture needle.  The microwire was advanced into the fistula and the needle was exchanged  for the a microsheath, which was lodged 2 cm into the access.  The wire was removed and the sheath was connected to the IV extension tubing.  Hand injections were completed to image the access from the antecubitum up to the level of axilla.  The central venous structures were also imaged by hand injections.  Elected to intervene on a graft stenosis at the venous anastomosis in the upper arm.  Bentson wire was placed and exchanged for a short 7 French sheath in the graft.  Patient was given 3000 units of IV heparin.  We crossed the lesion and this was ballooned with a 6 mm x 40 mm Mustang to nominal pressure for 2 minutes.  There was much better flow in the graft after this.  We then upsized to a 7 mm x 60 mm drug-coated Ranger after changing for a V 18 wire.  The same lesion was angioplastied for 3 minutes to nominal pressure.  Final shot showed no evidence of residual stenosis.  We did get a reflux shot that showed no proximal stenosis near the arterial anastomosis.  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  Once we were satisfied a 4-0 Monocryl purse-string suture was sewn around the sheath.  The sheath was removed while tying down the suture.  A sterile bandage was applied to the puncture site.  COMPLICATIONS: None  CONDITION: Stable  Marty Heck, MD [facility] [place] Office: [phone]  06/06/2019 2:27 PM

## 2019-06-06 NOTE — H&P (Signed)
History and Physical Interval Note:  06/06/2019 1:28 PM  Kelli [name]  has presented today for surgery, with the diagnosis of esrd.  The various methods of treatment have been discussed with the patient and family. After consideration of risks, benefits and other options for treatment, the patient has consented to  Procedure(s): A/V FISTULAGRAM (N/A) as a surgical intervention.  The patient's history has been reviewed, patient examined, no change in status, stable for surgery.  I have reviewed the patient's chart and labs.  Questions were answered to the patient's satisfaction.    Right arm fistulogram.  Prolonged bleeding after dialysis.  Marty Heck  CC: follow up  Requesting Provider: Manon Hilding, MD  HPI: Kelli [name] is a 69 y.o. (December 25, 1950) female who presents for follow up due to bleeding from right AV graft. She is s/p right upper arm AV Gore-Tex graft placement 09/06/18 by Dr. Donnetta Hutching.  She presents today with bleeding that has been occurring recently at the end of dialysis treatment. She is not a great historian but says that it has just been over the past several weeks. Initially she told me that it was not very much and that she was not having to stay after her treatment due to the bleeding. She did not express that pressure had to be applied or extensive dressing changes. However, she eventually said that she went home and her shirt was soaked in blood. She says that this does not happen every time. Unclear exactly the extent of bleeding. Per referral form she has had prolonged bleeding post treatment  She denies any steal symptoms. She is not having any swelling, weakness, numbness, coldness or pain in her right upper extremity  She has history of thrombosed right basilic vein fistula as well as a left arm basilic vein fistula and upper arm graft both of which also failed.  She does have history of previous stroke and her left arm remains flaccid secondary to this  The pt is on  a statin for cholesterol management.  The pt is not on a daily aspirin. Other AC: Plavix  The pt On torsemide, metoprolol and hydralazine for hypertension.  The pt is diabetic. On insulin  Tobacco hx: Former smoker, quit 2014      Past Medical History:  Diagnosis Date  . Anemia   . Anxiety   . Carotid artery occlusion    Occluded RICA, status post left CEA August 2014 - Dr. Donnetta Hutching  . Cerebral infarction [facility]) Aug 2014   Bihemispheric watershed infarcts  . Cerebral infarction involving left cerebellar artery [facility]) Feb 2015  . CHF (congestive heart failure) ([place])   . CKD (chronic kidney disease) stage 3, GFR 30-59 ml/min    dialysis [place]  . Closed dislocation of left humerus 07/26/2013  . Depression   . DM (diabetes mellitus), type 2 ([place])   . Dyspnea    with exertion  . Essential hypertension, benign   . Fibromyalgia   . Mixed hyperlipidemia   . Multiple gastric ulcers   . Pneumonia   . Stroke [facility])    pt states she cannot walk, left arm weakness  . Urinary incontinence         Past Surgical History:  Procedure Laterality Date  . AV FISTULA PLACEMENT Left 04/15/2017   Procedure: ARTERIOVENOUS (AV) FISTULA CREATION LEFT ARM; Surgeon: Serafina Mitchell, MD; Location: [facility] OR; Service: Vascular; Laterality: Left;  . AV FISTULA PLACEMENT Left 05/09/2017   Procedure: INSERTION OF ARTERIOVENOUS (  AV) GORE-TEX GRAFT LEFT UPPER ARM; Surgeon: [name], MD; Location: [facility] OR; Service: Vascular; Laterality: Left;  . AV FISTULA PLACEMENT Right 09/06/2018   Procedure: INSERTION OF ARTERIOVENOUS (AV) GORE-TEX GRAFT RIGHT ARM; Surgeon: [name], MD; Location: [place]; Service: Vascular; Laterality: Right;  . BASCILIC VEIN TRANSPOSITION Right 08/09/2018   Procedure: BASILIC VEIN TRANSPOSITION 1st Stage; Surgeon: [name], MD; Location: [place]; Service: Vascular; Laterality: Right;  . BIOPSY  01/22/2017   Procedure: BIOPSY; Surgeon: Danie Binder, MD; Location[facility]; Service:  Endoscopy;; gastric  . COMBINED HYSTERECTOMY VAGINAL W/ MMK / A&P REPAIR  1981  . ENDARTERECTOMY Left 10/06/2012   Procedure: Carotid Endarterectomy with Finesse patch angioplasty; Surgeon: [name], MD; Location: [place]; Service: Vascular; Laterality: Left;  . ESOPHAGOGASTRODUODENOSCOPY N/A 04/26/2014   Procedure: ESOPHAGOGASTRODUODENOSCOPY (EGD); Surgeon: Lear Ng, MD; Location: [facility] ENDOSCOPY; Service: Endoscopy; Laterality: N/A;  . ESOPHAGOGASTRODUODENOSCOPY N/A 01/22/2017   Procedure: ESOPHAGOGASTRODUODENOSCOPY (EGD); Surgeon: Danie Binder, MD; [facility]; Service: Endoscopy; Laterality: N/A;  . FLEXIBLE SIGMOIDOSCOPY N/A 01/22/2017   Procedure: FLEXIBLE SIGMOIDOSCOPY; Surgeon: Danie Binder, MD; [facility]; Service: Endoscopy; Laterality: N/A;  . IR CV LINE INJECTION  02/04/2017  . IR CV LINE INJECTION  04/28/2018  . IR CV LINE INJECTION  05/12/2018  . IR CV LINE INJECTION  05/18/2018  . IR FLUORO GUIDE CV LINE LEFT  01/14/2017  . IR FLUORO GUIDE CV LINE LEFT  04/25/2017  . IR FLUORO GUIDE CV LINE LEFT  05/13/2017  . IR FLUORO GUIDE CV LINE LEFT  03/02/2018  . IR FLUORO GUIDE CV LINE LEFT  04/28/2018  . IR FLUORO GUIDE CV LINE LEFT  05/12/2018  . IR FLUORO GUIDE CV LINE LEFT  05/18/2018  . IR FLUORO GUIDE CV LINE LEFT  06/23/2018  . IR FLUORO GUIDE CV LINE LEFT  08/02/2018  . IR PTA VENOUS EXCEPT DIALYSIS CIRCUIT  04/28/2018  . IR PTA VENOUS EXCEPT DIALYSIS CIRCUIT  05/12/2018  . IR REMOVAL TUN CV CATH W/O FL  07/08/2017  . IR REMOVAL TUN CV CATH W/O FL  10/11/2018  . IR TRANSCATH RETRIEVAL FB INCL GUIDANCE (MS)  05/12/2018  . IR US GUIDE VASC ACCESS LEFT  01/14/2017  . LOOP RECORDER IMPLANT  04/16/13   MDT LinQ implanted for cryptogenic stroke  . [name] WITHOUT CARDIOVERSION N/A 04/16/2013   Procedure: TRANSESOPHAGEAL ECHOCARDIOGRAM ([name]); Surgeon: Josue Hector, MD; Location: [place]; Service: Cardiovascular; Laterality: N/A;  . THROMBECTOMY AND REVISION  OF ARTERIOVENTOUS (AV) GORETEX GRAFT Left 07/24/2017   Procedure: THROMBECTOMY OF LEFT UPPER ARM ARTERIOVENTOUS (AV) GRAFT; Surgeon: Conrad [place], MD; Location: [place]; Service: Vascular; Laterality: Left;  . URETHRAL DILATION  1980's  . VAGINAL HYSTERECTOMY  1981   "partial" (10/04/2012)   Social History        Socioeconomic History  . Marital status: Married    Spouse name: Not on file  . Number of children: Not on file  . Years of education: Not on file  . Highest education level: Not on file  Occupational History  . Not on file  Tobacco Use  . Smoking status: Former Smoker    Packs/day: 1.00    Years: 44.00    Pack years: 44.00    Types: Cigarettes    Quit date: 10/03/2012    Years since quitting: 6.6  . Smokeless tobacco: Never Used  Substance and Sexual Activity  . Alcohol use: No  Alcohol/week: 0.0 standard drinks  . Drug use: No  . Sexual activity: Yes    Birth control/protection: Surgical  Other Topics Concern  . Not on file  Social History Narrative   INITIALLY WORKED [employer]. 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   Social Determinants of Health      Financial Resource Strain:   . Difficulty of Paying Living Expenses:   Food Insecurity:   . Worried About Charity fundraiser in the Last Year:   . Arboriculturist in the Last Year:   Transportation Needs:   . Film/video editor (Medical):   Marland Kitchen Lack of Transportation (Non-Medical):   Physical Activity:   . Days of Exercise per Week:   . Minutes of Exercise per Session:   Stress:   . Feeling of Stress :   Social Connections:   . Frequency of Communication with Friends and Family:   . Frequency of Social Gatherings with Friends and Family:   . Attends Religious Services:   . Active Member of Clubs or Organizations:   . Attends Archivist Meetings:   Marland Kitchen Marital Status:   Intimate Partner Violence:   . Fear of Current or Ex-Partner:   . Emotionally  Abused:   Marland Kitchen Physically Abused:   . Sexually Abused:         Family History  Problem Relation Age of Onset  . Diabetes Mother   . Diabetes Father   . Hypertension Sister   . Diabetes Brother   . Seizures Son   . Kidney disease Maternal Grandmother   . Hypertension Maternal Grandmother   . Heart disease Maternal Grandfather   . Diabetes Paternal Grandmother   . Heart disease Paternal Grandfather          Current Outpatient Medications  Medication Sig Dispense Refill  . acetaminophen (TYLENOL) 500 MG tablet Take 1,000 mg by mouth every 6 (six) hours as needed for moderate pain or headache.    Marland Kitchen amLODipine (NORVASC) 10 MG tablet Take 10 mg by mouth every evening.     Marland Kitchen atorvastatin (LIPITOR) 40 MG tablet Take 40 mg by mouth every evening.     . bismuth subsalicylate (PEPTO BISMOL) 262 MG/15ML suspension Take 30 mLs by mouth every 6 (six) hours as needed for indigestion.    . cephALEXin (KEFLEX) 500 MG capsule Take 1 capsule (500 mg total) by mouth 2 (two) times daily. 20 capsule 0  . clonazePAM (KLONOPIN) 0.5 MG tablet Take 0.5 mg by mouth daily.     . clopidogrel (PLAVIX) 75 MG tablet Take 1 tablet (75 mg total) by mouth daily. (Patient taking differently: Take 75 mg by mouth every evening. )    . epoetin alfa (EPOGEN,PROCRIT) 46270 UNIT/ML injection Inject 1 mL (10,000 Units total) into the vein Every Tuesday,Thursday,and Saturday with dialysis. 1 mL   . hydrALAZINE (APRESOLINE) 50 MG tablet Take 1 tablet (50 mg total) by mouth 3 (three) times daily. (Patient taking differently: Take 50 mg by mouth 2 (two) times daily. ) 90 tablet 0  . Insulin Glargine (BASAGLAR KWIKPEN) 100 UNIT/ML SOPN Inject 15 Units into the skin daily.    . metoprolol succinate (TOPROL-XL) 100 MG 24 hr tablet Take 1 tablet (100 mg total) by mouth daily. (Patient taking differently: Take 100 mg by mouth every evening. ) 30 tablet 0  . oxyCODONE-acetaminophen (PERCOCET) 5-325 MG tablet Take 1 tablet by mouth every 6  (six) hours as  needed for severe pain. 20 tablet 0  . sodium chloride (OCEAN) 0.65 % SOLN nasal spray Place 1 spray into both nostrils as needed for congestion.    . torsemide (DEMADEX) 20 MG tablet Take 20 mg by mouth daily.    Marland Kitchen venlafaxine XR (EFFEXOR-XR) 150 MG 24 hr capsule Take 150 mg by mouth daily with breakfast.    . Vitamins A & D (VITAMIN A & D) ointment Apply 1 application topically as needed for dry skin (applied to affected area(s) of legs).     No current facility-administered medications for this visit.            Facility-Administered Medications Ordered in Other Visits  Medication Dose Route Frequency Provider Last Rate Last Admin  . 0.9 % sodium chloride infusion  Intravenous Continuous Monia Sabal, PA-C  New Bag at 09/06/18 1206       Allergies  Allergen Reactions  . Hydrochlorothiazide Nausea And Vomiting   REVIEW OF SYSTEMS:  Review of Systems  Constitutional: Negative for chills, fever and malaise/fatigue.  HENT: Negative for congestion and sore throat.  Respiratory: Negative for cough and shortness of breath.  Cardiovascular: Negative for chest pain and leg swelling.  Gastrointestinal: Negative for abdominal pain, constipation, diarrhea, nausea and vomiting.  Genitourinary: Positive for dysuria.  Musculoskeletal: Positive for joint pain (right shoulder).  Neurological: Positive for dizziness. Negative for speech change, focal weakness and headaches.  Endo/Heme/Allergies: Bruises/bleeds easily.   PHYSICAL EXAMINATION:     Vitals:   05/30/19 1129  BP: 118/75  Pulse: 81  Temp: 98.2 F (36.8 C)  SpO2: 98%   General: WDWN in NAD; vital signs documented above  Gait: Not observed  HENT: WNL, normocephalic  Pulmonary: normal non-labored breathing , without Rales, rhonchi, wheezing  Cardiac: regular HR, without Murmurs without carotid bruit  Abdomen: soft, NT, no masses  Skin: without rashes  Vascular Exam/Pulses:2+ bilateral radial pulses, upper  extremities well perfused and warm. 5/5 grip strength right hand. Left upper extremity flaccid. Right upper extremity graft with good bruit and thrill. Some pulsatility in graft  Extremities: without ischemic changes, without Gangrene , without cellulitis; without open wounds;  Musculoskeletal: no muscle wasting or atrophy  Neurologic: A&O X 3; No focal weakness or paresthesias are detected  Psychiatric: The pt has Normal affect.  ASSESSMENT/PLAN:: 69 y.o. female here for follow up for prolonged bleeding following dialysis treatment.She is s/p right upper arm AV Gore-Tex graft placement 09/06/18 by Dr. Donnetta Hutching.  - she dialyzes Tuesday, Thursday, Saturday at [facility] in [place] so she will need scheduled on a non dialysis day  - considered ordering duplex of right upper extremity graft however due to uncertain extent of bleeding issues and history of multiple failed dialysis accesses discussed with her recommendation to proceed with fistulogram  - I have schedule her for a right upper extremity fistulogram  - she will follow up as needed after the fistulogram  Karoline Caldwell, PA-C  [facility]  [phone]  Clinic MD: Dr. Oneida Alar

## 2019-09-11 ENCOUNTER — Inpatient Hospital Stay (HOSPITAL_COMMUNITY)
Admission: AD | Admit: 2019-09-11 | Discharge: 2019-09-13 | DRG: 313 | Disposition: A | Discharge status: Home / Self Care | Payer: Medicare Other | Source: Other Acute Inpatient Hospital | Attending: Internal Medicine | Admitting: Internal Medicine

## 2019-09-11 DIAGNOSIS — I209 Angina pectoris, unspecified: Secondary | ICD-10-CM

## 2019-09-11 DIAGNOSIS — R519 Headache, unspecified: Secondary | ICD-10-CM | POA: Diagnosis present

## 2019-09-11 DIAGNOSIS — Z87891 Personal history of nicotine dependence: Secondary | ICD-10-CM

## 2019-09-11 DIAGNOSIS — I959 Hypotension, unspecified: Secondary | ICD-10-CM | POA: Diagnosis not present

## 2019-09-11 DIAGNOSIS — N186 End stage renal disease: Secondary | ICD-10-CM | POA: Diagnosis present

## 2019-09-11 DIAGNOSIS — I132 Hypertensive heart and chronic kidney disease with heart failure and with stage 5 chronic kidney disease, or end stage renal disease: Secondary | ICD-10-CM | POA: Diagnosis present

## 2019-09-11 DIAGNOSIS — F324 Major depressive disorder, single episode, in partial remission: Secondary | ICD-10-CM | POA: Diagnosis not present

## 2019-09-11 DIAGNOSIS — E1169 Type 2 diabetes mellitus with other specified complication: Secondary | ICD-10-CM | POA: Diagnosis not present

## 2019-09-11 DIAGNOSIS — Z8249 Family history of ischemic heart disease and other diseases of the circulatory system: Secondary | ICD-10-CM | POA: Diagnosis not present

## 2019-09-11 DIAGNOSIS — R079 Chest pain, unspecified: Secondary | ICD-10-CM | POA: Diagnosis present

## 2019-09-11 DIAGNOSIS — Z6833 Body mass index (BMI) 33.0-33.9, adult: Secondary | ICD-10-CM

## 2019-09-11 DIAGNOSIS — E1151 Type 2 diabetes mellitus with diabetic peripheral angiopathy without gangrene: Secondary | ICD-10-CM | POA: Diagnosis present

## 2019-09-11 DIAGNOSIS — Z20822 Contact with and (suspected) exposure to covid-19: Secondary | ICD-10-CM | POA: Diagnosis not present

## 2019-09-11 DIAGNOSIS — E1122 Type 2 diabetes mellitus with diabetic chronic kidney disease: Secondary | ICD-10-CM | POA: Diagnosis present

## 2019-09-11 DIAGNOSIS — I69334 Monoplegia of upper limb following cerebral infarction affecting left non-dominant side: Secondary | ICD-10-CM | POA: Diagnosis not present

## 2019-09-11 DIAGNOSIS — R072 Precordial pain: Secondary | ICD-10-CM | POA: Diagnosis not present

## 2019-09-11 DIAGNOSIS — Z833 Family history of diabetes mellitus: Secondary | ICD-10-CM | POA: Diagnosis not present

## 2019-09-11 DIAGNOSIS — I739 Peripheral vascular disease, unspecified: Secondary | ICD-10-CM

## 2019-09-11 DIAGNOSIS — E669 Obesity, unspecified: Secondary | ICD-10-CM | POA: Diagnosis not present

## 2019-09-11 DIAGNOSIS — F419 Anxiety disorder, unspecified: Secondary | ICD-10-CM | POA: Diagnosis not present

## 2019-09-11 DIAGNOSIS — R112 Nausea with vomiting, unspecified: Secondary | ICD-10-CM | POA: Diagnosis present

## 2019-09-11 DIAGNOSIS — E6609 Other obesity due to excess calories: Secondary | ICD-10-CM

## 2019-09-11 DIAGNOSIS — Z79899 Other long term (current) drug therapy: Secondary | ICD-10-CM | POA: Diagnosis not present

## 2019-09-11 DIAGNOSIS — I5032 Chronic diastolic (congestive) heart failure: Secondary | ICD-10-CM | POA: Diagnosis not present

## 2019-09-11 DIAGNOSIS — Z8711 Personal history of peptic ulcer disease: Secondary | ICD-10-CM | POA: Diagnosis not present

## 2019-09-11 DIAGNOSIS — F32A Depression, unspecified: Secondary | ICD-10-CM | POA: Diagnosis present

## 2019-09-11 DIAGNOSIS — Z992 Dependence on renal dialysis: Secondary | ICD-10-CM

## 2019-09-11 DIAGNOSIS — E782 Mixed hyperlipidemia: Secondary | ICD-10-CM

## 2019-09-11 DIAGNOSIS — F329 Major depressive disorder, single episode, unspecified: Secondary | ICD-10-CM | POA: Diagnosis present

## 2019-09-11 DIAGNOSIS — I10 Essential (primary) hypertension: Secondary | ICD-10-CM

## 2019-09-11 MED ORDER — ATORVASTATIN CALCIUM 40 MG PO TABS
40.0000 mg | ORAL_TABLET | Freq: Every evening | ORAL | Status: DC
  Administered 2019-09-12: 40 mg via ORAL
  Filled 2019-09-11: qty 1

## 2019-09-11 MED ORDER — VENLAFAXINE HCL ER 150 MG PO CP24
150.0000 mg | ORAL_CAPSULE | Freq: Every day | ORAL | Status: DC
  Administered 2019-09-12: 150 mg via ORAL
  Filled 2019-09-11 (×2): qty 1

## 2019-09-11 MED ORDER — METOPROLOL SUCCINATE ER 100 MG PO TB24: 100.0000 mg | ORAL_TABLET | Freq: Every evening | ORAL | Status: DC

## 2019-09-11 MED ORDER — ACETAMINOPHEN 325 MG PO TABS: 650.0000 mg | ORAL_TABLET | Freq: Four times a day (QID) | ORAL | Status: DC | PRN

## 2019-09-11 MED ORDER — CLONAZEPAM 0.5 MG PO TABS
0.5000 mg | ORAL_TABLET | Freq: Every day | ORAL | Status: DC
  Administered 2019-09-12: 0.5 mg via ORAL
  Filled 2019-09-11 (×2): qty 1

## 2019-09-11 MED ORDER — AMLODIPINE BESYLATE 10 MG PO TABS: 10.0000 mg | ORAL_TABLET | Freq: Every evening | ORAL | Status: DC

## 2019-09-11 MED ORDER — INSULIN ASPART 100 UNIT/ML ~~LOC~~ SOLN
0.0000 [IU] | Freq: Three times a day (TID) | SUBCUTANEOUS | Status: DC
  Administered 2019-09-12: 1 [IU] via SUBCUTANEOUS
  Administered 2019-09-12: 3 [IU] via SUBCUTANEOUS
  Administered 2019-09-12: 1 [IU] via SUBCUTANEOUS

## 2019-09-11 MED ORDER — FUROSEMIDE 20 MG PO TABS
20.0000 mg | ORAL_TABLET | Freq: Two times a day (BID) | ORAL | Status: DC
  Administered 2019-09-12 – 2019-09-13 (×2): 20 mg via ORAL
  Filled 2019-09-11 (×2): qty 1

## 2019-09-11 MED ORDER — INSULIN GLARGINE 100 UNIT/ML ~~LOC~~ SOLN
10.0000 [IU] | Freq: Every day | SUBCUTANEOUS | Status: DC
  Administered 2019-09-12 (×2): 10 [IU] via SUBCUTANEOUS
  Filled 2019-09-11 (×3): qty 0.1

## 2019-09-11 MED ORDER — CLOPIDOGREL BISULFATE 75 MG PO TABS
75.0000 mg | ORAL_TABLET | Freq: Every evening | ORAL | Status: DC
  Administered 2019-09-12: 75 mg via ORAL
  Filled 2019-09-11: qty 1

## 2019-09-11 MED ORDER — HYDRALAZINE HCL 50 MG PO TABS
50.0000 mg | ORAL_TABLET | Freq: Two times a day (BID) | ORAL | Status: DC
  Administered 2019-09-12: 50 mg via ORAL
  Filled 2019-09-11: qty 1

## 2019-09-11 MED ORDER — ACETAMINOPHEN 325 MG PO TABS
650.0000 mg | ORAL_TABLET | ORAL | Status: DC | PRN
  Administered 2019-09-12: 650 mg via ORAL
  Filled 2019-09-11: qty 2

## 2019-09-11 MED ORDER — ONDANSETRON HCL 4 MG/2ML IJ SOLN
4.0000 mg | Freq: Four times a day (QID) | INTRAMUSCULAR | Status: DC | PRN
  Administered 2019-09-12: 4 mg via INTRAVENOUS

## 2019-09-11 MED ORDER — HEPARIN SODIUM (PORCINE) 5000 UNIT/ML IJ SOLN
5000.0000 [IU] | Freq: Three times a day (TID) | INTRAMUSCULAR | Status: DC
  Administered 2019-09-12 – 2019-09-13 (×5): 5000 [IU] via SUBCUTANEOUS
  Filled 2019-09-11 (×4): qty 1

## 2019-09-11 NOTE — H&P (Signed)
History and Physical    SAMAURI KELLENBERGER CVE:[id number] DOB: 04-14-1950 DOA: 09/11/2019  PCP: Manon Hilding, MD  Patient coming from: Direct transfer from [facility] ED  I have personally briefly reviewed patient's old medical records in [facility]  Chief Complaint: Chest pain  HPI: Kelli [name] is a 69 y.o. female with medical history significant of end-stage renal disease on hemodialysis Tuesday Thursday Saturday via right upper extremity AV fistula, hypertension, diabetes mellitus type 2, hyperlipidemia, history of stroke with left arm paralysis, chronic congestive heart failure with preserved ejection fraction 55-60% (2018) who came to hospital tonight for evaluation of chest pain.  Patient stated that she was having her usual hemodialysis treatment at [place], [place], went towards end of the treatment she started to experience chest pain.  Chest pain was located to retrosternal area, squeezing in character, about 8/10 in intensity, no radiation, lasting about 3 minutes and reportedly resolved on its own.  Staff gave her aspirin and sublingual nitroglycerin. Patient had about 58 minutes treatment left, and had removed 2.8 L of fluids.  Beyond her acute symptoms patient reported intermittent chest congestion and chronic cough that has been present for least 2 to 3 weeks. She denied having  shortness of breath, fever chills, nausea vomiting. Hemodialysis unit decided to send her to ED for evaluation  ED Course:  Vital signs in the ED, temperature 98.2 F heart rate 87/min saturation 94%, respiration 19, blood pressure 129/74 Chemistry showed sodium of 137, potassium 3.4, chloride 97, CO2 27, anion gap 13, BUN 22 creatinine 3.8, BUN 6, glucose 203, calcium 7.0, PTT 24.  CBC showed WBC 15.1, hemoglobin 10.7, platelets 327, proBNP 14,183. Chest x-ray showed cardiomegaly, aortic atherosclerosis, lungs clear, no effusions or edema, no acute bony abnormality, loop recorder device remains  in place. EKG showed accelerated junctional rhythm, ST-T wave abnormality consider inferior ischemia, prolonged QT, septal infarct, age undetermined. ED physician discussed case with Dr. Davina Poke from cardiology who recommended in view of light symptoms stress test and request cardiac consultation if if needed   Review of Systems: Review of Systems  Constitutional: Negative for chills, fever and malaise/fatigue.  Eyes: Negative for blurred vision and double vision.  Respiratory: Positive for cough, sputum production and shortness of breath. Negative for wheezing.   Cardiovascular: Positive for chest pain. Negative for palpitations, claudication and leg swelling.  Gastrointestinal: Negative for abdominal pain, heartburn, nausea and vomiting.  Genitourinary: Negative for dysuria, frequency and urgency.  Musculoskeletal: Negative for back pain, joint pain and myalgias.  Neurological: Negative for dizziness, tremors and headaches.  Endo/Heme/Allergies: Does not bruise/bleed easily.  Psychiatric/Behavioral: Negative for depression. The patient is not nervous/anxious.    As per HPI otherwise all other systems reviewed and are negative.   Past Medical History:  Diagnosis Date  . Anemia   . Anxiety   . Carotid artery occlusion    Occluded RICA, status post left CEA  August 2014 - Dr. Donnetta Hutching  . Cerebral infarction [facility]) Aug 2014   Bihemispheric watershed infarcts  . Cerebral infarction involving left cerebellar artery [facility]) Feb 2015  . CHF (congestive heart failure) ([place])   . CKD (chronic kidney disease) stage 3, GFR 30-59 ml/min    dialysis [place]  . Closed dislocation of left humerus 07/26/2013  . Depression   . DM (diabetes mellitus), type 2 ([place])   . Dyspnea    with exertion  . Essential hypertension, benign   . Fibromyalgia   . Mixed hyperlipidemia   .  Multiple gastric ulcers   . Pneumonia   . Stroke [facility])    pt states she cannot walk, left arm weakness  . Urinary incontinence       Past Surgical History:  Procedure Laterality Date  . A/V FISTULAGRAM N/A 06/06/2019   Procedure: A/V FISTULAGRAM;  Surgeon: Marty Heck, MD;  Location: [place] CV LAB;  Service: Cardiovascular;  Laterality: N/A;  . AV FISTULA PLACEMENT Left 04/15/2017   Procedure: ARTERIOVENOUS (AV) FISTULA CREATION LEFT ARM;  Surgeon: Serafina Mitchell, MD;  Location: [facility] OR;  Service: Vascular;  Laterality: Left;  . AV FISTULA PLACEMENT Left 05/09/2017   Procedure: INSERTION OF ARTERIOVENOUS (AV) GORE-TEX GRAFT LEFT UPPER ARM;  Surgeon: [name], MD;  Location: [facility] OR;  Service: Vascular;  Laterality: Left;  . AV FISTULA PLACEMENT Right 09/06/2018   Procedure: INSERTION OF ARTERIOVENOUS (AV) GORE-TEX GRAFT RIGHT  ARM;  Surgeon: [name], MD;  Location: [place];  Service: Vascular;  Laterality: Right;  . BASCILIC VEIN TRANSPOSITION Right 08/09/2018   Procedure: BASILIC VEIN TRANSPOSITION 1st Stage;  Surgeon: [name], MD;  Location: [place];  Service: Vascular;  Laterality: Right;  . BIOPSY  01/22/2017   Procedure: BIOPSY;  Surgeon: Danie Binder, MD;  [facility]: [facility];  Service: Endoscopy;;  gastric  . COMBINED HYSTERECTOMY VAGINAL W/ MMK / A&P REPAIR  1981  . ENDARTERECTOMY Left 10/06/2012   Procedure: Carotid Endarterectomy with Finesse patch angioplasty;  Surgeon: [name], MD;  Location: [place];  Service: Vascular;  Laterality: Left;  . ESOPHAGOGASTRODUODENOSCOPY N/A 04/26/2014   Procedure: ESOPHAGOGASTRODUODENOSCOPY (EGD);  Surgeon: Lear Ng, MD;  Location: [facility] ENDOSCOPY;  Service: Endoscopy;  Laterality: N/A;  . ESOPHAGOGASTRODUODENOSCOPY N/A 01/22/2017   Procedure: ESOPHAGOGASTRODUODENOSCOPY (EGD);  Surgeon: Danie Binder, MD;  [facility];  Service: Endoscopy;  Laterality: N/A;  . FLEXIBLE SIGMOIDOSCOPY N/A 01/22/2017   Procedure: FLEXIBLE SIGMOIDOSCOPY;  Surgeon: Danie Binder, MD;  [facility];  Service: Endoscopy;  Laterality: N/A;   . IR CV LINE INJECTION  02/04/2017  . IR CV LINE INJECTION  04/28/2018  . IR CV LINE INJECTION  05/12/2018  . IR CV LINE INJECTION  05/18/2018  . IR FLUORO GUIDE CV LINE LEFT  01/14/2017  . IR FLUORO GUIDE CV LINE LEFT  04/25/2017  . IR FLUORO GUIDE CV LINE LEFT  05/13/2017  . IR FLUORO GUIDE CV LINE LEFT  03/02/2018  . IR FLUORO GUIDE CV LINE LEFT  04/28/2018  . IR FLUORO GUIDE CV LINE LEFT  05/12/2018  . IR FLUORO GUIDE CV LINE LEFT  05/18/2018  . IR FLUORO GUIDE CV LINE LEFT  06/23/2018  . IR FLUORO GUIDE CV LINE LEFT  08/02/2018  . IR PTA VENOUS EXCEPT DIALYSIS CIRCUIT  04/28/2018  . IR PTA VENOUS EXCEPT DIALYSIS CIRCUIT  05/12/2018  . IR REMOVAL TUN CV CATH W/O FL  07/08/2017  . IR REMOVAL TUN CV CATH W/O FL  10/11/2018  . IR TRANSCATH RETRIEVAL FB INCL GUIDANCE (MS)  05/12/2018  . IR US GUIDE VASC ACCESS LEFT  01/14/2017  . LOOP RECORDER IMPLANT  04/16/13   MDT LinQ implanted for cryptogenic stroke  . PERIPHERAL VASCULAR INTERVENTION Right 06/06/2019   Procedure: PERIPHERAL VASCULAR INTERVENTION;  Surgeon: Marty Heck, MD;  Location: [place] CV LAB;  Service: Cardiovascular;  Laterality: Right;  . [name] WITHOUT CARDIOVERSION N/A 04/16/2013   Procedure: TRANSESOPHAGEAL ECHOCARDIOGRAM ([name]);  Surgeon: Josue Hector,  MD;  Location: [place];  Service: Cardiovascular;  Laterality: N/A;  . THROMBECTOMY AND REVISION OF ARTERIOVENTOUS (AV) GORETEX  GRAFT Left 07/24/2017   Procedure: THROMBECTOMY OF LEFT UPPER ARM ARTERIOVENTOUS (AV) GRAFT;  Surgeon: Conrad Story, MD;  Location: [place];  Service: Vascular;  Laterality: Left;  . URETHRAL DILATION  1980's  . VAGINAL HYSTERECTOMY  1981   "partial" (10/04/2012)    Social History  reports that she quit smoking about 6 years ago. Her smoking use included cigarettes. She has a 44.00 pack-year smoking history. She has never used smokeless tobacco. She reports that she does not drink alcohol and does not use drugs.  Allergies  Allergen Reactions  .  Hydrochlorothiazide Nausea And Vomiting    Family History  Problem Relation Age of Onset  . Diabetes Mother   . Diabetes Father   . Hypertension Sister   . Diabetes Brother   . Seizures Son   . Kidney disease Maternal Grandmother   . Hypertension Maternal Grandmother   . Heart disease Maternal Grandfather   . Diabetes Paternal Grandmother   . Heart disease Paternal Grandfather    Prior to Admission medications   Medication Sig Start Date End Date Taking? Authorizing Provider  acetaminophen (TYLENOL) 500 MG tablet Take 1,000 mg by mouth every 6 (six) hours as needed for moderate pain or headache.    [provider]  amLODipine (NORVASC) 10 MG tablet Take 10 mg by mouth every evening.     [provider]  atorvastatin (LIPITOR) 40 MG tablet Take 40 mg by mouth every evening.  05/17/18   [provider]  bismuth subsalicylate (PEPTO BISMOL) 262 MG/15ML suspension Take 30 mLs by mouth every 6 (six) hours as needed for indigestion.    [provider]  clonazePAM (KLONOPIN) 0.5 MG tablet Take 0.5 mg by mouth daily.     [provider]  clopidogrel (PLAVIX) 75 MG tablet Take 1 tablet (75 mg total) by mouth daily. Patient taking differently: Take 75 mg by mouth every evening.  01/31/17   Kathie Dike, MD  furosemide (LASIX) 20 MG tablet Take 20 mg by mouth 2 (two) times daily.    [provider]  hydrALAZINE (APRESOLINE) 50 MG tablet Take 1 tablet (50 mg total) by mouth 3 (three) times daily. Patient taking differently: Take 50 mg by mouth 2 (two) times daily.  10/20/16   Cherene Altes, MD  Insulin Glargine (BASAGLAR KWIKPEN) 100 UNIT/ML SOPN Inject 15 Units into the skin daily.    [provider]  metoprolol succinate (TOPROL-XL) 100 MG 24 hr tablet Take 1 tablet (100 mg total) by mouth daily. Patient taking differently: Take 100 mg by mouth every evening.  07/24/17   Conrad [place]ar[place], MD  venlafaxine XR (EFFEXOR-XR) 150 MG 24 hr  capsule Take 150 mg by mouth daily.     [provider]    Physical Exam: Vitals:   09/11/19 2258  BP: 117/74  Pulse: 88  Resp: 18  Temp: 98.4 F (36.9 C)  TempSrc: Oral  SpO2: 96%  Weight: 90.4 kg    Constitutional: NAD, calm, comfortable Vitals:   09/11/19 2258  BP: 117/74  Pulse: 88  Resp: 18  Temp: 98.4 F (36.9 C)  TempSrc: Oral  SpO2: 96%  Weight: 90.4 kg   Eyes: PERRL, lids and conjunctivae normal ENMT: Mucous membranes are moist. Posterior pharynx clear of any exudate or lesions.Normal dentition.  Neck: normal, supple, no masses, no thyromegaly Respiratory: Bilateral  scattered rhonchi no wheezing, no crackles. Normal respiratory effort. No accessory muscle use.  Cardiovascular: Regular rate and rhythm, no murmurs / rubs / gallops. No extremity edema. 2+ pedal pulses. No carotid bruits.  Abdomen: no tenderness, no masses palpated. No hepatosplenomegaly. Bowel sounds positive.  Musculoskeletal: no clubbing / cyanosis. No joint deformity upper and lower extremities. Good ROM, no contractures. Normal muscle tone.  Skin: no rashes, lesions, ulcers. No induration.  Right upper extremity AV fistula Neurologic: CN 2-12 grossly intact. Sensation intact, DTR normal.  Left upper extremity strength,  0/5 left lower extremity 1/5, otherwise normal Psychiatric: Normal judgment and insight. Alert and oriented x 3. Normal mood.   Labs on Admission: I have personally reviewed following labs and imaging studies.  As per H&P  CBC: No results for input(s): WBC, NEUTROABS, HGB, HCT, MCV, PLT in the last 168 hours.  Basic Metabolic Panel: No results for input(s): NA, K, CL, CO2, GLUCOSE, BUN, CREATININE, CALCIUM, MG, PHOS in the last 168 hours.  GFR: CrCl cannot be calculated ([name] most recent lab result is older than the maximum 21 days allowed.).  Liver Function Tests: No results for input(s): AST, ALT, ALKPHOS, BILITOT, PROT, ALBUMIN in the last 168  hours.  Urine analysis:    Component Value Date/Time   COLORURINE YELLOW 01/10/2017 1150   APPEARANCEUR CLOUDY (A) 01/10/2017 1150   LABSPEC 1.011 01/10/2017 1150   PHURINE 7.0 01/10/2017 1150   GLUCOSEU NEGATIVE 01/10/2017 1150   HGBUR NEGATIVE 01/10/2017 1150   BILIRUBINUR NEGATIVE 01/10/2017 1150   KETONESUR NEGATIVE 01/10/2017 1150   PROTEINUR 100 (A) 01/10/2017 1150   UROBILINOGEN 0.2 04/25/2014 2315   NITRITE NEGATIVE 01/10/2017 1150   LEUKOCYTESUR MODERATE (A) 01/10/2017 1150    Radiological Exams on Admission: As per H&P No results found.  EKG: Independently reviewed.  As per H&P  Assessment/Plan Principal Problem:   Ischemic chest pain ([place]) Active Problems:   HTN (hypertension)   Obesity   Mixed hyperlipidemia   Peripheral arterial disease (HCC)   Depression   Diabetes mellitus type 2 in obese (HCC)   ESRD (end stage renal disease) (H[facility])   Chest pain   Precordial chest pain Patient does have significant risk factors for all coronary artery disease including peripheral arterial disease with prior stroke, end-stage renal disease, diabetes mellitus type 2. The most recent stress test is from 2015 which was normal at that time. EKG with no acute ischemic changes specifically no ST elevation. Check troponins and repeat EKG. No recent echocardiogram, will update If normal, will schedule nuclear pharmacologic stress test in the morning. Continue Plavix, Toprol-XL and Lipitor  End-stage renal disease on hemodialysis TTS Patient receiving treatment Tuesday Thursday Saturday.  Currently does not appear to be volume overloaded.   Request nephrology consultation the morning for dialysis, if remains in the hospital until Thursday.  Essential hypertension  Blood pressure stable.  Continue amlodipine, hydralazine, furosemide and metoprolol.  Diabetes mellitus type 2, controlled Placed on glycemic regimen with Lantus and sliding scale.  Continue monitor blood  glucose.  History of stroke with left arm paralysis Peripheral arterial disease Patient with significant debility using wheelchair.  Continue preventive treatment with Plavix, Lipitor.  Chronic congestive heart failure with preserved ejection fraction No evidence of CHF exacerbation.  Avoid volume overload.  Continue to monitor  Anxiety/depression   Continue home regimen of clonazepam and Effexor XR  Obesity BMI 33 Weight loss strongly encouraged as complicates all aspects of medical care.   DVT prophylaxis: Heparin  subcutaneously Code Status:        Full code Family Communication:  Husband Disposition Plan:   Patient is from:  Home  Anticipated DC to:             Home  Anticipated DC date:  Next 1 to 2 days  Anticipated DC barriers: None Consults called:  None Admission status:  Observation  Severity of Illness: The appropriate patient status for this patient is OBSERVATION. Observation status is judged to be reasonable and necessary in order to provide the required intensity of service to ensure the patient's safety. The patient's presenting symptoms, physical exam findings, and initial radiographic and laboratory data in the context of their medical condition is felt to place them at decreased risk for further clinical deterioration. Furthermore, it is anticipated that the patient will be medically stable for discharge from the hospital within 2 midnights of admission. The following factors support the patient status of observation.   " The patient's presenting symptoms include, chest pain " The physical exam findings include, bilateral respiratory rhonchi " The initial radiographic and laboratory data are, as per H&P, clear chest x-ray  Allie Dimmer MD [facility]  How to contact the [facility] Attending or Consulting provider [place] or covering provider during after hours [place], for this patient?   1. Check the care team in [facility] and look for a) attending/consulting [facility]  provider listed and b) the [facility] team listed 2. Log into [email or web address] and use [place]'s universal password to access. If you do not have the password, please contact the hospital operator. 3. Locate the [facility] provider you are looking for under [facility] and page to a number that you can be directly reached. 4. If you still have difficulty reaching the provider, please page the [facility] (Director on Call) for the Hospitalists listed on amion for assistance.  09/11/2019, 11:47 PM

## 2019-09-12 ENCOUNTER — Observation Stay (HOSPITAL_BASED_OUTPATIENT_CLINIC_OR_DEPARTMENT_OTHER): Payer: Medicare Other

## 2019-09-12 ENCOUNTER — Observation Stay (HOSPITAL_COMMUNITY): Payer: Medicare Other

## 2019-09-12 DIAGNOSIS — E1122 Type 2 diabetes mellitus with diabetic chronic kidney disease: Secondary | ICD-10-CM | POA: Diagnosis present

## 2019-09-12 DIAGNOSIS — Z79899 Other long term (current) drug therapy: Secondary | ICD-10-CM | POA: Diagnosis not present

## 2019-09-12 DIAGNOSIS — Z6833 Body mass index (BMI) 33.0-33.9, adult: Secondary | ICD-10-CM | POA: Diagnosis not present

## 2019-09-12 DIAGNOSIS — Z87891 Personal history of nicotine dependence: Secondary | ICD-10-CM | POA: Diagnosis not present

## 2019-09-12 DIAGNOSIS — E1151 Type 2 diabetes mellitus with diabetic peripheral angiopathy without gangrene: Secondary | ICD-10-CM | POA: Diagnosis present

## 2019-09-12 DIAGNOSIS — R112 Nausea with vomiting, unspecified: Secondary | ICD-10-CM | POA: Diagnosis present

## 2019-09-12 DIAGNOSIS — I132 Hypertensive heart and chronic kidney disease with heart failure and with stage 5 chronic kidney disease, or end stage renal disease: Secondary | ICD-10-CM | POA: Diagnosis present

## 2019-09-12 DIAGNOSIS — I5032 Chronic diastolic (congestive) heart failure: Secondary | ICD-10-CM | POA: Diagnosis present

## 2019-09-12 DIAGNOSIS — Z992 Dependence on renal dialysis: Secondary | ICD-10-CM | POA: Diagnosis not present

## 2019-09-12 DIAGNOSIS — R079 Chest pain, unspecified: Secondary | ICD-10-CM

## 2019-09-12 DIAGNOSIS — R072 Precordial pain: Secondary | ICD-10-CM | POA: Diagnosis present

## 2019-09-12 DIAGNOSIS — N186 End stage renal disease: Secondary | ICD-10-CM | POA: Diagnosis present

## 2019-09-12 DIAGNOSIS — Z8711 Personal history of peptic ulcer disease: Secondary | ICD-10-CM | POA: Diagnosis not present

## 2019-09-12 DIAGNOSIS — Z20822 Contact with and (suspected) exposure to covid-19: Secondary | ICD-10-CM | POA: Diagnosis present

## 2019-09-12 DIAGNOSIS — Z8249 Family history of ischemic heart disease and other diseases of the circulatory system: Secondary | ICD-10-CM | POA: Diagnosis not present

## 2019-09-12 DIAGNOSIS — Z833 Family history of diabetes mellitus: Secondary | ICD-10-CM | POA: Diagnosis not present

## 2019-09-12 DIAGNOSIS — F419 Anxiety disorder, unspecified: Secondary | ICD-10-CM | POA: Diagnosis present

## 2019-09-12 DIAGNOSIS — I69334 Monoplegia of upper limb following cerebral infarction affecting left non-dominant side: Secondary | ICD-10-CM | POA: Diagnosis not present

## 2019-09-12 DIAGNOSIS — E669 Obesity, unspecified: Secondary | ICD-10-CM | POA: Diagnosis present

## 2019-09-12 DIAGNOSIS — R519 Headache, unspecified: Secondary | ICD-10-CM | POA: Diagnosis present

## 2019-09-12 DIAGNOSIS — I959 Hypotension, unspecified: Secondary | ICD-10-CM | POA: Diagnosis present

## 2019-09-12 DIAGNOSIS — F329 Major depressive disorder, single episode, unspecified: Secondary | ICD-10-CM | POA: Diagnosis present

## 2019-09-12 DIAGNOSIS — E782 Mixed hyperlipidemia: Secondary | ICD-10-CM | POA: Diagnosis present

## 2019-09-12 DIAGNOSIS — I209 Angina pectoris, unspecified: Secondary | ICD-10-CM | POA: Diagnosis not present

## 2019-09-12 LAB — CBC
HCT: 31.6 % — ABNORMAL LOW (ref 36.0–46.0)
Hemoglobin: 9.9 g/dL — ABNORMAL LOW (ref 12.0–15.0)
MCH: 30 pg (ref 26.0–34.0)
MCHC: 31.3 g/dL (ref 30.0–36.0)
MCV: 95.8 fL (ref 80.0–100.0)
Platelets: 297 10*3/uL (ref 150–400)
RBC: 3.3 MIL/uL — ABNORMAL LOW (ref 3.87–5.11)
RDW: 16.1 % — ABNORMAL HIGH (ref 11.5–15.5)
WBC: 14.4 10*3/uL — ABNORMAL HIGH (ref 4.0–10.5)
nRBC: 0 % (ref 0.0–0.2)

## 2019-09-12 LAB — BASIC METABOLIC PANEL
Anion gap: 17 — ABNORMAL HIGH (ref 5–15)
BUN: 27 mg/dL — ABNORMAL HIGH (ref 8–23)
CO2: 24 mmol/L (ref 22–32)
Calcium: 6.9 mg/dL — ABNORMAL LOW (ref 8.9–10.3)
Chloride: 96 mmol/L — ABNORMAL LOW (ref 98–111)
Creatinine, Ser: 4.47 mg/dL — ABNORMAL HIGH (ref 0.44–1.00)
GFR calc Af Amer: 11 mL/min — ABNORMAL LOW (ref >60)
GFR calc non Af Amer: 9 mL/min — ABNORMAL LOW (ref >60)
Glucose, Bld: 260 mg/dL — ABNORMAL HIGH (ref 70–99)
Potassium: 3.6 mmol/L (ref 3.5–5.1)
Sodium: 137 mmol/L (ref 135–145)

## 2019-09-12 LAB — LIPID PANEL
Cholesterol: 119 mg/dL (ref 0–200)
HDL: 24 mg/dL — ABNORMAL LOW (ref >40)
LDL Cholesterol: 36 mg/dL (ref 0–99)
Total CHOL/HDL Ratio: 5 RATIO
Triglycerides: 296 mg/dL — ABNORMAL HIGH (ref <150)
VLDL: 59 mg/dL — ABNORMAL HIGH (ref 0–40)

## 2019-09-12 LAB — GLUCOSE, CAPILLARY
Glucose-Capillary: 324 mg/dL — ABNORMAL HIGH (ref 70–99)
Glucose-Capillary: 151 mg/dL — ABNORMAL HIGH (ref 70–99)
Glucose-Capillary: 173 mg/dL — ABNORMAL HIGH (ref 70–99)
Glucose-Capillary: 255 mg/dL — ABNORMAL HIGH (ref 70–99)
Glucose-Capillary: 188 mg/dL — ABNORMAL HIGH (ref 70–99)
Glucose-Capillary: 196 mg/dL — ABNORMAL HIGH (ref 70–99)

## 2019-09-12 LAB — ECHOCARDIOGRAM COMPLETE: Weight: 3202.84 oz

## 2019-09-12 LAB — HIV ANTIBODY (ROUTINE TESTING W REFLEX): HIV Screen 4th Generation wRfx: NONREACTIVE

## 2019-09-12 LAB — NM MYOCAR MULTI W/SPECT W/WALL MOTION / EF
Estimated workload: 1 METS
MPHR: 152 {beats}/min
Peak HR: 117 {beats}/min
Percent HR: 76 %
Rest HR: 96 {beats}/min

## 2019-09-12 LAB — TROPONIN I (HIGH SENSITIVITY)
Troponin I (High Sensitivity): 26 ng/L — ABNORMAL HIGH (ref <18)
Troponin I (High Sensitivity): 28 ng/L — ABNORMAL HIGH (ref <18)

## 2019-09-12 LAB — MAGNESIUM: Magnesium: 1.5 mg/dL — ABNORMAL LOW (ref 1.7–2.4)

## 2019-09-12 LAB — HEMOGLOBIN A1C
Hgb A1c MFr Bld: 7.7 % — ABNORMAL HIGH (ref 4.8–5.6)
Mean Plasma Glucose: 174.29 mg/dL

## 2019-09-12 MED ORDER — REGADENOSON 0.4 MG/5ML IV SOLN
0.4000 mg | Freq: Once | INTRAVENOUS | Status: AC
  Administered 2019-09-12: 0.4 mg via INTRAVENOUS

## 2019-09-12 MED ORDER — ACETAMINOPHEN 325 MG PO TABS
650.0000 mg | ORAL_TABLET | Freq: Once | ORAL | Status: AC
  Administered 2019-09-12: 650 mg via ORAL

## 2019-09-12 MED ORDER — MAGNESIUM SULFATE 2 GM/50ML IV SOLN
2.0000 g | Freq: Once | INTRAVENOUS | Status: AC
  Administered 2019-09-12: 2 g via INTRAVENOUS
  Filled 2019-09-12: qty 50

## 2019-09-12 MED ORDER — TECHNETIUM TC 99M TETROFOSMIN IV KIT
31.5000 | PACK | Freq: Once | INTRAVENOUS | Status: AC | PRN
  Administered 2019-09-12: 31.5 via INTRAVENOUS

## 2019-09-12 MED ORDER — METHOCARBAMOL 500 MG PO TABS: 500.0000 mg | ORAL_TABLET | Freq: Four times a day (QID) | ORAL | Status: DC | PRN

## 2019-09-12 MED ORDER — TECHNETIUM TC 99M TETROFOSMIN IV KIT
10.5000 | PACK | Freq: Once | INTRAVENOUS | Status: AC | PRN
  Administered 2019-09-12: 10.5 via INTRAVENOUS

## 2019-09-12 MED ORDER — ONDANSETRON HCL 4 MG/2ML IJ SOLN
INTRAMUSCULAR | Status: AC
  Filled 2019-09-12: qty 2

## 2019-09-12 MED ORDER — REGADENOSON 0.4 MG/5ML IV SOLN
INTRAVENOUS | Status: AC
  Filled 2019-09-12: qty 5

## 2019-09-12 MED ORDER — ACETAMINOPHEN 325 MG PO TABS
ORAL_TABLET | ORAL | Status: AC
  Filled 2019-09-12: qty 2

## 2019-09-12 MED ORDER — SODIUM CHLORIDE 0.9 % IV SOLN
Freq: Once | INTRAVENOUS | Status: AC
  Administered 2019-09-12: 125 mL via INTRAVENOUS

## 2019-09-12 NOTE — Progress Notes (Signed)
1 day myoview completed, patient had hypotension, N/V and headache during the lexiscan myoview. Given zofran, tylenol and 125 cc IVF. Pending final result by [facility] reader

## 2019-09-12 NOTE — Consult Note (Signed)
CARDIOLOGY CONSULT NOTE       Patient ID: Kelli [name] MRN: [id number] DOB/AGE: 08/06/1950 69 y.o.  Admit date: 09/11/2019 Referring Physician: Poplawski Primary Physician: Manon Hilding, MD Primary Cardiologist: Johnny Bridge Reason for Consultation: Chest Pain  Principal Problem:   Ischemic chest pain [facility]) Active Problems:   HTN (hypertension)   Obesity   Mixed hyperlipidemia   Peripheral arterial disease ([place])   Depression   Diabetes mellitus type 2 in obese ([place])   ESRD (end stage renal disease) ([place])   Chest pain   HPI:  69 y.o. with hx of crptogenic CVA, has ILR with dead battery implanted 7 years ago with no PAF. CRF;s HTN, DM-2, PVD with occluded RICA and post left CEA. CRF on dialysis for about a year and a half. Also history of gastric ulcers. No documented CAD. No recent stress testing. TTE being done at bedside now reviewed with normal EF / LVH no RWMA no effusion. MAC.   Patient stated that she was having her usual hemodialysis treatment at [place], [place], went towards end of the treatment she started to experience chest pain.  Chest pain was located to retrosternal area, squeezing in character, about 8/10 in intensity, no radiation, lasting about 3 minutes and reportedly resolved on its own.  Staff gave her aspirin and sublingual nitroglycerin. Patient had about 58 minutes treatment left, and had removed 2.8 L of fluids.  Beyond her acute symptoms patient reported intermittent chest congestion and chronic cough that has been present for least 2 to 3 weeks. She denied having  shortness of breath, fever chills, nausea vomiting.  ROS All other systems reviewed and negative except as noted above  Past Medical History:  Diagnosis Date  . Anemia   . Anxiety   . Carotid artery occlusion    Occluded RICA, status post left CEA  August 2014 - Dr. Donnetta Hutching  . Cerebral infarction [facility]) Aug 2014   Bihemispheric watershed infarcts  . Cerebral infarction involving left  cerebellar artery [facility]) Feb 2015  . CHF (congestive heart failure) ([place])   . CKD (chronic kidney disease) stage 3, GFR 30-59 ml/min    dialysis T/Th/sa  . Closed dislocation of left humerus 07/26/2013  . Depression   . DM (diabetes mellitus), type 2 ([place])   . Dyspnea    with exertion  . Essential hypertension, benign   . Fibromyalgia   . Mixed hyperlipidemia   . Multiple gastric ulcers   . Pneumonia   . Stroke [facility])    pt states she cannot walk, left arm weakness  . Urinary incontinence     Family History  Problem Relation Age of Onset  . Diabetes Mother   . Diabetes Father   . Hypertension Sister   . Diabetes Brother   . Seizures Son   . Kidney disease Maternal Grandmother   . Hypertension Maternal Grandmother   . Heart disease Maternal Grandfather   . Diabetes Paternal Grandmother   . Heart disease Paternal Grandfather     Social History   Socioeconomic History  . Marital status: Married    Spouse name: Not on file  . Number of children: Not on file  . Years of education: Not on file  . Highest education level: Not on file  Occupational History  . Not on file  Tobacco Use  . Smoking status: Former Smoker    Packs/day: 1.00    Years: 44.00    Pack years: 44.00    Types: Cigarettes  Quit date: 10/03/2012    Years since quitting: 6.9  . Smokeless tobacco: Never Used  Vaping Use  . Vaping Use: Never used  Substance and Sexual Activity  . Alcohol use: No    Alcohol/week: 0.0 standard drinks  . Drug use: No  . Sexual activity: Yes    Birth control/protection: Surgical  Other Topics Concern  . Not on file  Social History Narrative   INITIALLY WORKED [employer]. 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   Social Determinants of Health   Financial Resource Strain:   . Difficulty of Paying Living Expenses:   Food Insecurity:   . Worried About Charity fundraiser in the Last Year:   . Arboriculturist in the Last Year:     Transportation Needs:   . Film/video editor (Medical):   Marland Kitchen Lack of Transportation (Non-Medical):   Physical Activity:   . Days of Exercise per Week:   . Minutes of Exercise per Session:   Stress:   . Feeling of Stress :   Social Connections:   . Frequency of Communication with Friends and Family:   . Frequency of Social Gatherings with Friends and Family:   . Attends Religious Services:   . Active Member of Clubs or Organizations:   . Attends Archivist Meetings:   Marland Kitchen Marital Status:   Intimate Partner Violence:   . Fear of Current or Ex-Partner:   . Emotionally Abused:   Marland Kitchen Physically Abused:   . Sexually Abused:     Past Surgical History:  Procedure Laterality Date  . A/V FISTULAGRAM N/A 06/06/2019   Procedure: A/V FISTULAGRAM;  Surgeon: Marty Heck, MD;  Location: [place] CV LAB;  Service: Cardiovascular;  Laterality: N/A;  . AV FISTULA PLACEMENT Left 04/15/2017   Procedure: ARTERIOVENOUS (AV) FISTULA CREATION LEFT ARM;  Surgeon: Serafina Mitchell, MD;  Location: [facility] OR;  Service: Vascular;  Laterality: Left;  . AV FISTULA PLACEMENT Left 05/09/2017   Procedure: INSERTION OF ARTERIOVENOUS (AV) GORE-TEX GRAFT LEFT UPPER ARM;  Surgeon: [name], MD;  Location: [facility] OR;  Service: Vascular;  Laterality: Left;  . AV FISTULA PLACEMENT Right 09/06/2018   Procedure: INSERTION OF ARTERIOVENOUS (AV) GORE-TEX GRAFT RIGHT  ARM;  Surgeon: [name], MD;  Location: [place];  Service: Vascular;  Laterality: Right;  . BASCILIC VEIN TRANSPOSITION Right 08/09/2018   Procedure: BASILIC VEIN TRANSPOSITION 1st Stage;  Surgeon: [name], MD;  Location: [place];  Service: Vascular;  Laterality: Right;  . BIOPSY  01/22/2017   Procedure: BIOPSY;  Surgeon: Danie Binder, MD;  Location: [facility];  Service: Endoscopy;;  gastric  . COMBINED HYSTERECTOMY VAGINAL W/ MMK / A&P REPAIR  1981  . ENDARTERECTOMY Left 10/06/2012   Procedure: Carotid Endarterectomy with Finesse patch  angioplasty;  Surgeon: [name], MD;  Location: [place];  Service: Vascular;  Laterality: Left;  . ESOPHAGOGASTRODUODENOSCOPY N/A 04/26/2014   Procedure: ESOPHAGOGASTRODUODENOSCOPY (EGD);  Surgeon: Lear Ng, MD;  Location: [facility] ENDOSCOPY;  Service: Endoscopy;  Laterality: N/A;  . ESOPHAGOGASTRODUODENOSCOPY N/A 01/22/2017   Procedure: ESOPHAGOGASTRODUODENOSCOPY (EGD);  Surgeon: Danie Binder, MD;  [facility];  Service: Endoscopy;  Laterality: N/A;  . FLEXIBLE SIGMOIDOSCOPY N/A 01/22/2017   Procedure: FLEXIBLE SIGMOIDOSCOPY;  Surgeon: Danie Binder, MD;  [facility];  Service: Endoscopy;  Laterality: N/A;  . IR CV LINE INJECTION  02/04/2017  . IR  CV LINE INJECTION  04/28/2018  . IR CV LINE INJECTION  05/12/2018  . IR CV LINE INJECTION  05/18/2018  . IR FLUORO GUIDE CV LINE LEFT  01/14/2017  . IR FLUORO GUIDE CV LINE LEFT  04/25/2017  . IR FLUORO GUIDE CV LINE LEFT  05/13/2017  . IR FLUORO GUIDE CV LINE LEFT  03/02/2018  . IR FLUORO GUIDE CV LINE LEFT  04/28/2018  . IR FLUORO GUIDE CV LINE LEFT  05/12/2018  . IR FLUORO GUIDE CV LINE LEFT  05/18/2018  . IR FLUORO GUIDE CV LINE LEFT  06/23/2018  . IR FLUORO GUIDE CV LINE LEFT  08/02/2018  . IR PTA VENOUS EXCEPT DIALYSIS CIRCUIT  04/28/2018  . IR PTA VENOUS EXCEPT DIALYSIS CIRCUIT  05/12/2018  . IR REMOVAL TUN CV CATH W/O FL  07/08/2017  . IR REMOVAL TUN CV CATH W/O FL  10/11/2018  . IR TRANSCATH RETRIEVAL FB INCL GUIDANCE (MS)  05/12/2018  . IR US GUIDE VASC ACCESS LEFT  01/14/2017  . LOOP RECORDER IMPLANT  04/16/13   MDT LinQ implanted for cryptogenic stroke  . PERIPHERAL VASCULAR INTERVENTION Right 06/06/2019   Procedure: PERIPHERAL VASCULAR INTERVENTION;  Surgeon: Marty Heck, MD;  Location: [place] CV LAB;  Service: Cardiovascular;  Laterality: Right;  . [name] WITHOUT CARDIOVERSION N/A 04/16/2013   Procedure: TRANSESOPHAGEAL ECHOCARDIOGRAM ([name]);  Surgeon: Josue Hector, MD;  Location: [place];  Service:  Cardiovascular;  Laterality: N/A;  . THROMBECTOMY AND REVISION OF ARTERIOVENTOUS (AV) GORETEX  GRAFT Left 07/24/2017   Procedure: THROMBECTOMY OF LEFT UPPER ARM ARTERIOVENTOUS (AV) GRAFT;  Surgeon: Conrad [place], MD;  Location: [place];  Service: Vascular;  Laterality: Left;  . URETHRAL DILATION  1980's  . VAGINAL HYSTERECTOMY  1981   "partial" (10/04/2012)      Current Facility-Administered Medications:  .  acetaminophen (TYLENOL) tablet 650 mg, 650 mg, Oral, Q4H PRN, Poplawski, Rafal, MD, 650 mg at 09/12/19 1005 .  amLODipine (NORVASC) tablet 10 mg, 10 mg, Oral, QPM, Poplawski, Rafal, MD .  atorvastatin (LIPITOR) tablet 40 mg, 40 mg, Oral, QPM, Poplawski, Rafal, MD .  clonazePAM (KLONOPIN) tablet 0.5 mg, 0.5 mg, Oral, Daily, Poplawski, Rafal, MD, 0.5 mg at 09/12/19 1009 .  clopidogrel (PLAVIX) tablet 75 mg, 75 mg, Oral, QPM, Poplawski, Rafal, MD .  furosemide (LASIX) tablet 20 mg, 20 mg, Oral, BID, Poplawski, Rafal, MD .  heparin injection 5,000 Units, 5,000 Units, Subcutaneous, Q8H, Poplawski, Rafal, MD, 5,000 Units at 09/12/19 0612 .  hydrALAZINE (APRESOLINE) tablet 50 mg, 50 mg, Oral, BID, Poplawski, Rafal, MD, 50 mg at 09/12/19 1009 .  insulin aspart (novoLOG) injection 0-6 Units, 0-6 Units, Subcutaneous, TID WC, Poplawski, Rafal, MD, 1 Units at 09/12/19 0730 .  insulin glargine (LANTUS) injection 10 Units, 10 Units, Subcutaneous, QHS, Poplawski, Rafal, MD, 10 Units at 09/12/19 0034 .  metoprolol succinate (TOPROL-XL) 24 hr tablet 100 mg, 100 mg, Oral, QPM, Poplawski, Rafal, MD .  ondansetron (ZOFRAN) injection 4 mg, 4 mg, Intravenous, Q6H PRN, Poplawski, Rafal, MD .  venlafaxine XR (EFFEXOR-XR) 24 hr capsule 150 mg, 150 mg, Oral, Daily, Poplawski, Rafal, MD  Facility-Administered Medications Ordered in Other Encounters:  .  0.9 %  sodium chloride infusion, , Intravenous, Continuous, Lynnea Maizes, [place] at 09/06/18 1206 . amLODipine  10 mg Oral QPM  . atorvastatin  40 mg Oral  QPM  . clonazePAM  0.5 mg Oral Daily  . clopidogrel  75 mg Oral QPM  . furosemide  20 mg Oral BID  . heparin  5,000 Units Subcutaneous Q8H  . hydrALAZINE  50 mg Oral BID  . insulin aspart  0-6 Units Subcutaneous TID WC  . insulin glargine  10 Units Subcutaneous QHS  . metoprolol succinate  100 mg Oral QPM  . venlafaxine XR  150 mg Oral Daily     Physical Exam: Blood pressure 114/68, pulse 98, temperature 98.3 F (36.8 C), temperature source Oral, resp. rate 20, weight 90.8 kg, SpO2 96 %.    Affect  Flat Chronically ill pale female  HEENT: left CEA  Neck supple with no adenopathy JVP normal left  bruits no thyromegaly Lungs clear with no wheezing and good diaphragmatic motion Heart:  S1/S2 no murmur, no rub, gallop or click PMI normal Abdomen: benighn, BS positve, no tenderness, no AAA no bruit.  No HSM or HJR Distal pulses intact with no bruits No edema Neuro left hemiparesis  Skin warm and dry No muscular weakness Fistula in RUE with thrill    Labs:   Lab Results  Component Value Date   WBC 14.4 (H) 09/12/2019   HGB 9.9 (L) 09/12/2019   HCT 31.6 (L) 09/12/2019   MCV 95.8 09/12/2019   PLT 297 09/12/2019    Recent Labs  Lab 09/12/19 0049  NA 137  K 3.6  CL 96*  CO2 24  BUN 27*  CREATININE 4.47*  CALCIUM 6.9*  GLUCOSE 260*   Lab Results  Component Value Date   CKTOTAL 20 (L) 10/13/2016   TROPONINI 0.04 (HH) 01/28/2017    Lab Results  Component Value Date   CHOL 119 09/12/2019   CHOL 103 01/22/2017   CHOL 132 10/14/2016   Lab Results  Component Value Date   HDL 24 (L) 09/12/2019   HDL 37 (L) 01/22/2017   HDL 26 (L) 10/14/2016   Lab Results  Component Value Date   LDLCALC 36 09/12/2019   LDLCALC 45 01/22/2017   LDLCALC 57 10/14/2016   Lab Results  Component Value Date   TRIG 296 (H) 09/12/2019   TRIG 106 01/22/2017   TRIG 247 (H) 10/14/2016   Lab Results  Component Value Date   CHOLHDL 5.0 09/12/2019   CHOLHDL 2.8 01/22/2017    CHOLHDL 5.1 10/14/2016   No results found for: LDLDIRECT    Radiology: No results found.  EKG: SR PR 264 nonspecific ST changes    ASSESSMENT AND PLAN:   1. Chest Pain: mixed picture though not exertional Multiple CRFls including Known PVD/DM troponin negative on ly 26/28 in renal failure patient  and no acute ECG changes For lexiscan myovue this am Holding left arm up over head may be and issue Echo with normal EF no RWMAls and no effusion Continue plavix and beta blocker   2. CRF:  Notify renal inpatient dialysis fistula with descent thrill  3. HtN:  Well controlled.  Continue current medications and low sodium Dash type diet.    4. HLD continue statin   5. DM:  Discussed low carb diet.  Target hemoglobin A1c is 6.5 or less.  Continue current medications.   SignedJenkins Rouge 09/12/2019, 10:40 AM

## 2019-09-12 NOTE — Progress Notes (Signed)
  Echocardiogram 2D Echocardiogram has been performed.  Alphonsa Brickle G Rhetta Cleek 09/12/2019, 10:02 AM

## 2019-09-12 NOTE — Progress Notes (Signed)
PROGRESS NOTE    Kelli [name]  JKK:[id number] DOB: [birth year] DOA: 09/11/2019 PCP: Manon Hilding, MD   Brief Narrative: Kelli [name] is a 69 y.o. female with medical history significant of end-stage renal disease on hemodialysis Tuesday Thursday Saturday via right upper extremity AV fistula, hypertension, diabetes mellitus type 2, hyperlipidemia, history of stroke with left arm paralysis, chronic congestive heart failure with preserved ejection fraction 55-60% (2018) who presented with complaints of chest pain.  She developed chest pain towards the end of her dialysis session.  On presentation, she was hemodynamically stable.  EKG showed no acute changes.  Cardiology consulted and she is undergoing stress test today.  Nephrology also consulted for dialysis.   Assessment & Plan:   Principal Problem:   Ischemic chest pain ([place]) Active Problems:   HTN (hypertension)   Obesity   Mixed hyperlipidemia   Peripheral arterial disease ([facility])   Depression   Diabetes mellitus type 2 in obese ([facility])   ESRD (end stage renal disease) ([place])   Chest pain   Precordial chest pain Patient does have significant risk factors for all coronary artery disease including peripheral arterial disease with prior stroke, end-stage renal disease, diabetes mellitus type 2. The most recent stress test is from 2015 which was normal at that time. EKG with no acute ischemic changes specifically no ST elevation. On  Plavix, Toprol-XL and Lipitor Echo did not show any wall motion of normality, normal ejection fraction, grade 1 diastolic function.  Stress test report pending.   End-stage renal disease on hemodialysis TTS Patient receiving treatment Tuesday Thursday Saturday.  Currently does not appear to be volume overloaded.   Requested nephrology consultation the morning for dialysis  Essential hypertension  Blood pressure soft.  On  amlodipine, hydralazine, furosemide and metoprolol which will be  held  Diabetes mellitus type 2, controlled Placed on glycemic regimen with Lantus and sliding scale.  Continue monitor blood glucose.  History of stroke with left arm paralysis Peripheral arterial disease Patient with significant debility using wheelchair.  Continue preventive treatment with Plavix, Lipitor.  Chronic congestive heart failure with preserved ejection fraction No evidence of CHF exacerbation.    C  Anxiety/depression   Continue home regimen of clonazepam and Effexor XR  Obesity BMI 33 Weight loss strongly encouraged          DVT prophylaxis:Heparin  Code Status: Full Family Communication: Husband present at the bedside Status is: Inpatient  Dispo: The patient is from: Home              Anticipated d/c is to: Home              Anticipated d/c date is: 1 day              Patient currently is not medically stable to d/c.   Waiting for stress test report, cardiology clearance  Consultants: Cardiology, nephrology  Procedures: Stress test  Antimicrobials:  Anti-infectives (From admission, onward)   None      Subjective: Patient seen and examined at the bedside this afternoon.  Blood pressure remains soft.  She is alert and oriented, chest pain-free.  Husband was at the bedside.  No new complaints.  Objective: Vitals:   09/12/19 1214 09/12/19 1217 09/12/19 1220 09/12/19 1339  BP: (!) 81/51 119/76 136/77 93/60  Pulse: 97 (!) 111 100 93  Resp:      Temp:    98.8 F (37.1 C)  TempSrc:    Oral  SpO2:  93%  Weight:       No intake or output data in the 24 hours ending 09/12/19 1630 Filed Weights   09/11/19 2258 09/12/19 0444  Weight: 90.4 kg 90.8 kg    Examination:  General exam: Deconditioned, debilitated, obese HEENT:PERRL,Oral mucosa moist, Ear/Nose normal on gross exam Respiratory system: Bilateral equal air entry, normal vesicular breath sounds, no wheezes or crackles  Cardiovascular system: S1 & S2 heard, RRR. No JVD, murmurs,  rubs, gallops or clicks. No pedal edema. Gastrointestinal system: Abdomen is nondistended, soft and nontender. No organomegaly or masses felt. Normal bowel sounds heard. Central nervous system: Alert and oriented.  Left hemiparesis Extremities: No edema, no clubbing ,no cyanosis Skin: No rashes, lesions or ulcers,no icterus ,no pallor  Data Reviewed: I have personally reviewed following labs and imaging studies  CBC: Recent Labs  Lab 09/12/19 0049  WBC 14.4*  HGB 9.9*  HCT 31.6*  MCV 95.8  PLT 974   Basic Metabolic Panel: Recent Labs  Lab 09/12/19 0049  NA 137  K 3.6  CL 96*  CO2 24  GLUCOSE 260*  BUN 27*  CREATININE 4.47*  CALCIUM 6.9*  MG 1.5*   GFR: Estimated Creatinine Clearance: 13.4 mL/min (A) (by C-G formula based on SCr of 4.47 mg/dL (H)). Liver Function Tests: No results for input(s): AST, ALT, ALKPHOS, BILITOT, PROT, ALBUMIN in the last 168 hours. No results for input(s): LIPASE, AMYLASE in the last 168 hours. No results for input(s): AMMONIA in the last 168 hours. Coagulation Profile: No results for input(s): INR, PROTIME in the last 168 hours. Cardiac Enzymes: No results for input(s): CKTOTAL, CKMB, CKMBINDEX, TROPONINI in the last 168 hours. BNP (last 3 results) No results for input(s): PROBNP in the last 8760 hours. HbA1C: Recent Labs    09/12/19 0049  HGBA1C 7.7*   CBG: Recent Labs  Lab 09/11/19 2256 09/12/19 0442 09/12/19 0729 09/12/19 1334  GLUCAP 324* 151* 173* 255*   Lipid Profile: Recent Labs    09/12/19 0049  CHOL 119  HDL 24*  LDLCALC 36  TRIG 296*  CHOLHDL 5.0   Thyroid Function Tests: No results for input(s): TSH, T4TOTAL, FREET4, T3FREE, THYROIDAB in the last 72 hours. Anemia Panel: No results for input(s): VITAMINB12, FOLATE, FERRITIN, TIBC, IRON, RETICCTPCT in the last 72 hours. Sepsis Labs: No results for input(s): PROCALCITON, LATICACIDVEN in the last 168 hours.  No results found for this or any previous visit  (from the past 240 hour(s)).       Radiology Studies: ECHOCARDIOGRAM COMPLETE  Result Date: 09/12/2019    ECHOCARDIOGRAM REPORT   Patient Name:   Kelli [name] Date of Exam: 09/12/2019 Medical Rec #:  [id number]     Height:       65.0 in Accession #:    [id number]    Weight:       200.2 lb Date of Birth:  [birth year]      BSA:          1.979 m Patient Age:    74 years      BP:           112/68 mmHg Patient Gender: F             HR:           97 bpm. Exam Location:  Inpatient Procedure: 2D Echo, Cardiac Doppler and Color Doppler Indications:    Acute Myocardial Infarction 410.  History:        Patient has prior history  of Echocardiogram examinations, most                 recent 01/08/2017. CHF, Carotid Disease and Stroke,                 Signs/Symptoms:Dyspnea; Risk Factors:Hypertension, Diabetes and                 Dyslipidemia. CKD.  Sonographer:    Jonelle Sidle Dance Referring Phys: [id number] RAFAL POPLAWSKI IMPRESSIONS  1. Left ventricular ejection fraction, by estimation, is 60 to 65%. The left ventricle has normal function. The left ventricle has no regional wall motion abnormalities. There is moderate concentric left ventricular hypertrophy. Left ventricular diastolic parameters are consistent with Grade I diastolic dysfunction (impaired relaxation).  2. Right ventricular systolic function is normal. The right ventricular size is normal. There is normal pulmonary artery systolic pressure.  3. Left atrial size was severely dilated.  4. The mitral valve is normal in structure. No evidence of mitral valve regurgitation. No evidence of mitral stenosis.  5. The aortic valve is tricuspid. Aortic valve regurgitation is not visualized. No aortic stenosis is present.  6. The inferior vena cava is normal in size with greater than 50% respiratory variability, suggesting right atrial pressure of 3 mmHg. FINDINGS  Left Ventricle: Left ventricular ejection fraction, by estimation, is 60 to 65%. The left ventricle has normal  function. The left ventricle has no regional wall motion abnormalities. The left ventricular internal cavity size was normal in size. There is  moderate concentric left ventricular hypertrophy. Left ventricular diastolic parameters are consistent with Grade I diastolic dysfunction (impaired relaxation). Right Ventricle: The right ventricular size is normal. No increase in right ventricular wall thickness. Right ventricular systolic function is normal. There is normal pulmonary artery systolic pressure. The tricuspid regurgitant velocity is 2.14 m/s, and  with an assumed right atrial pressure of 3 mmHg, the estimated right ventricular systolic pressure is 75.6 mmHg. Left Atrium: Left atrial size was severely dilated. Right Atrium: Right atrial size was normal in size. Pericardium: There is no evidence of pericardial effusion. Mitral Valve: The mitral valve is normal in structure. Normal mobility of the mitral valve leaflets. No evidence of mitral valve regurgitation. No evidence of mitral valve stenosis. Tricuspid Valve: The tricuspid valve is normal in structure. Tricuspid valve regurgitation is trivial. No evidence of tricuspid stenosis. Aortic Valve: The aortic valve is tricuspid. . There is mild thickening and mild calcification of the aortic valve. Aortic valve regurgitation is not visualized. No aortic stenosis is present. There is mild thickening of the aortic valve. There is mild calcification of the aortic valve. Pulmonic Valve: The pulmonic valve was normal in structure. Pulmonic valve regurgitation is not visualized. No evidence of pulmonic stenosis. Aorta: The aortic root is normal in size and structure. Venous: The inferior vena cava is normal in size with greater than 50% respiratory variability, suggesting right atrial pressure of 3 mmHg. IAS/Shunts: No atrial level shunt detected by color flow Doppler.  LEFT VENTRICLE PLAX 2D LVIDd:         3.61 cm LVIDs:         2.41 cm LV PW:         1.54 cm LV IVS:         1.44 cm LVOT diam:     1.90 cm LV SV:         64 LV SV Index:   32 LVOT Area:     2.84 cm  RIGHT VENTRICLE  IVC RV Basal diam:  2.30 cm    IVC diam: 1.61 cm RV S prime:     8.92 cm/s TAPSE (M-mode): 2.0 cm LEFT ATRIUM             Index       RIGHT ATRIUM           Index LA diam:        4.90 cm 2.48 cm/m  RA Area:     12.50 cm LA Vol (A2C):   97.3 ml 49.17 ml/m RA Volume:   24.60 ml  12.43 ml/m LA Vol (A4C):   97.9 ml 49.47 ml/m LA Biplane Vol: 99.1 ml 50.08 ml/m  AORTIC VALVE LVOT Vmax:   106.00 cm/s LVOT Vmean:  68.100 cm/s LVOT VTI:    0.225 m  AORTA Ao Root diam: 2.90 cm Ao Asc diam:  3.20 cm MITRAL VALVE                TRICUSPID VALVE MV Area (PHT): 2.56 cm     TR Peak grad:   18.3 mmHg MV Decel Time: 296 msec     TR Vmax:        214.00 cm/s MV E velocity: 161.00 cm/s MV A velocity: 170.00 cm/s  SHUNTS MV E/A ratio:  0.95         Systemic VTI:  0.22 m                             Systemic Diam: 1.90 cm Skeet Latch MD Electronically signed by Skeet Latch MD Signature Date/Time: 09/12/2019/1:25:51 PM    Final         Scheduled Meds: . acetaminophen      . atorvastatin  40 mg Oral QPM  . clonazePAM  0.5 mg Oral Daily  . clopidogrel  75 mg Oral QPM  . furosemide  20 mg Oral BID  . heparin  5,000 Units Subcutaneous Q8H  . insulin aspart  0-6 Units Subcutaneous TID WC  . insulin glargine  10 Units Subcutaneous QHS  . ondansetron      . regadenoson      . venlafaxine XR  150 mg Oral Daily   Continuous Infusions:   LOS: 1 day    Time spent: 25 mins.More than 50% of that time was spent in counseling and/or coordination of care.      Shelly Coss, MD [facility] P7/14/2021, 4:30 PM

## 2019-09-12 NOTE — Progress Notes (Signed)
Pt had vomiting and HA during chemical stress test. PA, Meng at bedside. Zofran and tylenol given per orders. 125cc bolus normal saline also given for hypotension. After test finished pt states that she feels some better and that she would like to drink coke, snack given to pt. Vitals stable prior to transfer to nuc med camera lab for pt to have exercising images taken.

## 2019-09-12 NOTE — Progress Notes (Signed)
Pt admitted from [facility] via EMS. Pt no longer experiencing any chest pain and vitals are stable. Has congestion in upper airways but is no acute respiratory distress, on room air. [facility] hospitalist contacted for pt admission orders. CBG 324 on admission, MD notified, SS insulin and lantus orders placed. 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 Educated on call bell usage and fall prevention strategies. Will continue to monitor.

## 2019-09-13 DIAGNOSIS — R079 Chest pain, unspecified: Secondary | ICD-10-CM

## 2019-09-13 LAB — RENAL FUNCTION PANEL
Albumin: 2.9 g/dL — ABNORMAL LOW (ref 3.5–5.0)
Anion gap: 20 — ABNORMAL HIGH (ref 5–15)
BUN: 41 mg/dL — ABNORMAL HIGH (ref 8–23)
CO2: 19 mmol/L — ABNORMAL LOW (ref 22–32)
Calcium: 6.7 mg/dL — ABNORMAL LOW (ref 8.9–10.3)
Chloride: 100 mmol/L (ref 98–111)
Creatinine, Ser: 6.16 mg/dL — ABNORMAL HIGH (ref 0.44–1.00)
GFR calc Af Amer: 7 mL/min — ABNORMAL LOW (ref >60)
GFR calc non Af Amer: 6 mL/min — ABNORMAL LOW (ref >60)
Glucose, Bld: 152 mg/dL — ABNORMAL HIGH (ref 70–99)
Phosphorus: 7.5 mg/dL — ABNORMAL HIGH (ref 2.5–4.6)
Potassium: 3.5 mmol/L (ref 3.5–5.1)
Sodium: 139 mmol/L (ref 135–145)

## 2019-09-13 LAB — CBC WITH DIFFERENTIAL/PLATELET
Abs Immature Granulocytes: 0.1 10*3/uL — ABNORMAL HIGH (ref 0.00–0.07)
Basophils Absolute: 0.1 10*3/uL (ref 0.0–0.1)
Basophils Relative: 1 %
Eosinophils Absolute: 0.2 10*3/uL (ref 0.0–0.5)
Eosinophils Relative: 1 %
HCT: 28.7 % — ABNORMAL LOW (ref 36.0–46.0)
Hemoglobin: 9 g/dL — ABNORMAL LOW (ref 12.0–15.0)
Immature Granulocytes: 1 %
Lymphocytes Relative: 17 %
Lymphs Abs: 1.9 10*3/uL (ref 0.7–4.0)
MCH: 30.8 pg (ref 26.0–34.0)
MCHC: 31.4 g/dL (ref 30.0–36.0)
MCV: 98.3 fL (ref 80.0–100.0)
Monocytes Absolute: 1.1 10*3/uL — ABNORMAL HIGH (ref 0.1–1.0)
Monocytes Relative: 10 %
Neutro Abs: 7.9 10*3/uL — ABNORMAL HIGH (ref 1.7–7.7)
Neutrophils Relative %: 70 %
Platelets: 306 10*3/uL (ref 150–400)
RBC: 2.92 MIL/uL — ABNORMAL LOW (ref 3.87–5.11)
RDW: 15.9 % — ABNORMAL HIGH (ref 11.5–15.5)
WBC: 11.1 10*3/uL — ABNORMAL HIGH (ref 4.0–10.5)
nRBC: 0 % (ref 0.0–0.2)

## 2019-09-13 LAB — GLUCOSE, CAPILLARY: Glucose-Capillary: 146 mg/dL — ABNORMAL HIGH (ref 70–99)

## 2019-09-13 MED ORDER — METHOCARBAMOL 500 MG PO TABS: 500.0000 mg | ORAL_TABLET | Freq: Four times a day (QID) | ORAL | 0 refills | Status: DC | PRN

## 2019-09-13 NOTE — Progress Notes (Signed)
Subjective:  No chest pain somewhat obtunded this am BS ok   Objective:  Vitals:   09/12/19 1644 09/12/19 1949 09/13/19 0342 09/13/19 0805  BP: 115/81 107/68 129/73 111/67  Pulse: 92 92 95 95  Resp: 18 15 16    Temp: 98.4 F (36.9 C) 98.9 F (37.2 C) 98.6 F (37 C)   TempSrc: Oral Oral Oral   SpO2: 95% 95% 95% 95%  Weight:   91.2 kg     Intake/Output from previous day:  Intake/Output Summary (Last 24 hours) at 09/13/2019 0940 Last data filed at 09/12/2019 2200 Gross per 24 hour  Intake 120 ml  Output 500 ml  Net -380 ml    Physical Exam: Obtunded Chronically ill pale white female  HEENT: normal Neck supple with no adenopathy JVP normal no bruits no thyromegaly Lungs clear with no wheezing and good diaphragmatic motion Heart:  S1/S2 no murmur, no rub, gallop or click PMI normal Abdomen: benighn, BS positve, no tenderness, no AAA no bruit.  No HSM or HJR Distal pulses intact with no bruits No edema Neuro left hemiparesis with some aphasia  Skin warm and dry No muscular weakness   Lab Results: Basic Metabolic Panel: Recent Labs    09/12/19 0049  NA 137  K 3.6  CL 96*  CO2 24  GLUCOSE 260*  BUN 27*  CREATININE 4.47*  CALCIUM 6.9*  MG 1.5*   CBC: Recent Labs    09/12/19 0049 09/13/19 0755  WBC 14.4* 11.1*  NEUTROABS  --  7.9*  HGB 9.9* 9.0*  HCT 31.6* 28.7*  MCV 95.8 98.3  PLT 297 306   Hemoglobin A1C: Recent Labs    09/12/19 0049  HGBA1C 7.7*   Fasting Lipid Panel: Recent Labs    09/12/19 0049  CHOL 119  HDL 24*  LDLCALC 36  TRIG 296*  CHOLHDL 5.0    Imaging: NM Myocar Multi W/Spect W/Wall Motion / EF  Result Date: 09/12/2019  There was no ST segment deviation noted during stress.  T wave inversion was noted during stress in the V5 and V6 leads. T wave inversion persisted.  The study is normal.  This is a low risk study.  Nuclear stress EF: 59%.  Low risk stress nuclear study with normal perfusion and normal left  ventricular regional and global systolic function.  ECHOCARDIOGRAM COMPLETE  Result Date: 09/12/2019    ECHOCARDIOGRAM REPORT   Patient Name:   Kelli [name] Date of Exam: 09/12/2019 Medical Rec #:  [id number]     Height:       65.0 in Accession #:    [id number]    Weight:       200.2 lb Date of Birth:  February 08, 1951      BSA:          1.979 m Patient Age:    70 years      BP:           112/68 mmHg Patient Gender: F             HR:           97 bpm. Exam Location:  Inpatient Procedure: 2D Echo, Cardiac Doppler and Color Doppler Indications:    Acute Myocardial Infarction 410.  History:        Patient has prior history of Echocardiogram examinations, most                 recent 01/08/2017. CHF, Carotid Disease and Stroke,  Signs/Symptoms:Dyspnea; Risk Factors:Hypertension, Diabetes and                 Dyslipidemia. CKD.  Sonographer:    Jonelle Sidle Dance Referring Phys: [id number] RAFAL POPLAWSKI IMPRESSIONS  1. Left ventricular ejection fraction, by estimation, is 60 to 65%. The left ventricle has normal function. The left ventricle has no regional wall motion abnormalities. There is moderate concentric left ventricular hypertrophy. Left ventricular diastolic parameters are consistent with Grade I diastolic dysfunction (impaired relaxation).  2. Right ventricular systolic function is normal. The right ventricular size is normal. There is normal pulmonary artery systolic pressure.  3. Left atrial size was severely dilated.  4. The mitral valve is normal in structure. No evidence of mitral valve regurgitation. No evidence of mitral stenosis.  5. The aortic valve is tricuspid. Aortic valve regurgitation is not visualized. No aortic stenosis is present.  6. The inferior vena cava is normal in size with greater than 50% respiratory variability, suggesting right atrial pressure of 3 mmHg. FINDINGS  Left Ventricle: Left ventricular ejection fraction, by estimation, is 60 to 65%. The left ventricle has normal  function. The left ventricle has no regional wall motion abnormalities. The left ventricular internal cavity size was normal in size. There is  moderate concentric left ventricular hypertrophy. Left ventricular diastolic parameters are consistent with Grade I diastolic dysfunction (impaired relaxation). Right Ventricle: The right ventricular size is normal. No increase in right ventricular wall thickness. Right ventricular systolic function is normal. There is normal pulmonary artery systolic pressure. The tricuspid regurgitant velocity is 2.14 m/s, and  with an assumed right atrial pressure of 3 mmHg, the estimated right ventricular systolic pressure is 09.8 mmHg. Left Atrium: Left atrial size was severely dilated. Right Atrium: Right atrial size was normal in size. Pericardium: There is no evidence of pericardial effusion. Mitral Valve: The mitral valve is normal in structure. Normal mobility of the mitral valve leaflets. No evidence of mitral valve regurgitation. No evidence of mitral valve stenosis. Tricuspid Valve: The tricuspid valve is normal in structure. Tricuspid valve regurgitation is trivial. No evidence of tricuspid stenosis. Aortic Valve: The aortic valve is tricuspid. . There is mild thickening and mild calcification of the aortic valve. Aortic valve regurgitation is not visualized. No aortic stenosis is present. There is mild thickening of the aortic valve. There is mild calcification of the aortic valve. Pulmonic Valve: The pulmonic valve was normal in structure. Pulmonic valve regurgitation is not visualized. No evidence of pulmonic stenosis. Aorta: The aortic root is normal in size and structure. Venous: The inferior vena cava is normal in size with greater than 50% respiratory variability, suggesting right atrial pressure of 3 mmHg. IAS/Shunts: No atrial level shunt detected by color flow Doppler.  LEFT VENTRICLE PLAX 2D LVIDd:         3.61 cm LVIDs:         2.41 cm LV PW:         1.54 cm LV IVS:         1.44 cm LVOT diam:     1.90 cm LV SV:         64 LV SV Index:   32 LVOT Area:     2.84 cm  RIGHT VENTRICLE            IVC RV Basal diam:  2.30 cm    IVC diam: 1.61 cm RV S prime:     8.92 cm/s TAPSE (M-mode): 2.0 cm LEFT ATRIUM  Index       RIGHT ATRIUM           Index LA diam:        4.90 cm 2.48 cm/m  RA Area:     12.50 cm LA Vol (A2C):   97.3 ml 49.17 ml/m RA Volume:   24.60 ml  12.43 ml/m LA Vol (A4C):   97.9 ml 49.47 ml/m LA Biplane Vol: 99.1 ml 50.08 ml/m  AORTIC VALVE LVOT Vmax:   106.00 cm/s LVOT Vmean:  68.100 cm/s LVOT VTI:    0.225 m  AORTA Ao Root diam: 2.90 cm Ao Asc diam:  3.20 cm MITRAL VALVE                TRICUSPID VALVE MV Area (PHT): 2.56 cm     TR Peak grad:   18.3 mmHg MV Decel Time: 296 msec     TR Vmax:        214.00 cm/s MV E velocity: 161.00 cm/s MV A velocity: 170.00 cm/s  SHUNTS MV E/A ratio:  0.95         Systemic VTI:  0.22 m                             Systemic Diam: 1.90 cm Skeet Latch MD Electronically signed by Skeet Latch MD Signature Date/Time: 09/12/2019/1:25:51 PM    Final     Cardiac Studies:  ECG:    Telemetry:  NSR 09/13/2019   Echo:  EF 60-65% no RWMA no effusion no significant valve disease   Medications:   . atorvastatin  40 mg Oral QPM  . clonazePAM  0.5 mg Oral Daily  . clopidogrel  75 mg Oral QPM  . furosemide  20 mg Oral BID  . heparin  5,000 Units Subcutaneous Q8H  . insulin aspart  0-6 Units Subcutaneous TID WC  . insulin glargine  10 Units Subcutaneous QHS  . venlafaxine XR  150 mg Oral Daily      Assessment/Plan:  1. Chest Pain: mixed picture though not exertional Multiple CRFls including Known PVD/DM troponin negative only 26/28 in renal failure patient  and no acute ECG changes Echo normal and myovue 09/12/19 normal no ischemia EF 57% no further cardiac w/u planned ok to d/c   2. CRF:  fistula with good thrill for dialysis   3. HtN:  Well controlled.  Continue current medications and low sodium Dash  type diet.    4. HLD continue statin   5. DM:  Discussed low carb diet.  Target hemoglobin A1c is 6.5 or less.  Continue current medications.  Cardiology will sign off   Jenkins Rouge 09/13/2019, 9:40 AM

## 2019-09-13 NOTE — Consult Note (Addendum)
Nephrology consult note:  Reason: ESRD Requesting MD:  Dr. Tawanna Solo . HPI: Patient is currently admitted after presenting with CP at end of HD.  Had troponins cycled, TTE, stress test yesterday which was unrevealing, cardiology consultation and is set for discharge today.  Nephrology is consulted for dialysis.  Patient is feeling fine this AM and desires discharge. Marland Kitchen PMH:  Reviewed in chart including ESRD on HD, h/o CVA DM, HTN, HFpEF SOCHx: Married, former smoker . ALLERGIES: HCTZ MEDS: reviewed in Epic . PE  VSS afebrile, on RA Gen: lying flat in bed, comfortable CV: RRR Lungs: normal WOB flat Extr: no edema, RUE AVG +t/b, dressings from last HD removed Neuro: conversant, LUE hemiparesis s/p CVA . Labs: reviewed - K 3.6, Bicarb 24, BUN 27, Hb 9.9 . A/P:   ESRD on HD:  TTS [place].  I spoke with Javier Docker -- her chair time is 10:30am today and they are able to accommodate her treatment as usual even if she's a bit late.  I spoke with the patient and she is very amenable to going to outpt HD today.  She says her husband is en route to the hospital already and can take her there.     Plan for her to attend outpt HD today per usual schedule.

## 2019-09-13 NOTE — Discharge Summary (Signed)
Physician Discharge Summary  Kelli [name] KXF:[id number] DOB: 1950-12-14 DOA: 09/11/2019  PCP: Kelli Hilding, MD  Admit date: 09/11/2019 Discharge date: 09/13/2019  Admitted From: Home Disposition:  Home  Discharge Condition:Stable CODE STATUS:FULL Diet recommendation: Heart Healthy   Brief/Interim Summary:  Kelli D Clarkis a 69 y.o.femalewith medical history significant ofend-stage renal disease on hemodialysis Tuesday Thursday Saturday via right upper extremity AV fistula, hypertension, diabetes mellitus type 2,hyperlipidemia,history of stroke with left armparalysis,chronic congestive heart failure with preserved ejection fraction55-60% (2018)who presented with complaints of chest pain.  She developed chest pain towards the end of her dialysis session.  On presentation, she was hemodynamically stable.  EKG showed no acute changes.  Cardiology consulted and she underwent nuclear stress test with finding of low risk study.  She is hemodynamically stable for discharge home today.  Following problems were addressed during hospitalization: Precordial chest pain Patient did have significant risk factors for all coronary artery disease including peripheral arterial disease with prior stroke, end-stage renal disease, diabetes mellitus type 2. The most recent stress test is from 2015 which was normal at that time. EKG with no acute ischemic changes specifically no ST elevation. On  Plavix, Toprol-XL and Lipitor Echo did not show any wall motion of normality, normal ejection fraction, grade 1 diastolic function. Stress test denies any ischemia with ejection fraction of 59%.  No further cardiac work-up planned.  End-stage renal disease on hemodialysis TTS Patient receiving treatment Tuesday Thursday Saturday. Currently does not appear to be volume overloaded. She will undergo dialysis at her own center today  Essential hypertension  Blood pressure soft.On  amlodipine,  hydralazine, furosemide and metoprolol .  I have advised the husband to hold these medications if her blood pressure remains soft.  Diabetes mellitus type 2, controlled Continue home regimen  History of stroke with left arm paralysis Peripheral arterial disease Patient with significant debility using wheelchair. On Plavix, Lipitor.  Chronic congestive heart failure with preserved ejection fraction No evidence of CHF exacerbation.Volume management by dialysis  Anxiety/depression Continue home regimen of clonazepam and Effexor XR  Obesity  BMI 33      Discharge Diagnoses:  Principal Problem:   Ischemic chest pain ([place]) Active Problems:   HTN (hypertension)   Obesity   Mixed hyperlipidemia   Peripheral arterial disease ([facility])   Depression   Diabetes mellitus type 2 in obese (H[facility])   ESRD (end stage renal disease) ([place])   Chest pain    Discharge Instructions  Discharge Instructions    Diet - low sodium heart healthy   Complete by: As directed    Discharge instructions   Complete by: As directed    1)Please monitor your blood pressure at home 2)Attend your dialysis sessions   Discharge wound care:   Complete by: As directed    Continue wound care   Increase activity slowly   Complete by: As directed      Allergies as of 09/13/2019      Reactions   Hydrochlorothiazide Nausea And Vomiting      Medication List    STOP taking these medications   furosemide 20 MG tablet Commonly known as: LASIX     TAKE these medications   acetaminophen 500 MG tablet Commonly known as: TYLENOL Take 1,000 mg by mouth every 6 (six) hours as needed for moderate pain or headache.   amLODipine 10 MG tablet Commonly known as: NORVASC Take 10 mg by mouth every evening.   atorvastatin 40 MG tablet Commonly known as:  LIPITOR Take 40 mg by mouth every evening.   Basaglar KwikPen 100 UNIT/ML Inject 15 Units into the skin daily.   bismuth subsalicylate 443 XV/40GQ  suspension Commonly known as: PEPTO BISMOL Take 30 mLs by mouth every 6 (six) hours as needed for indigestion.   clonazePAM 0.5 MG tablet Commonly known as: KLONOPIN Take 0.5 mg by mouth daily.   clopidogrel 75 MG tablet Commonly known as: PLAVIX Take 1 tablet (75 mg total) by mouth daily. What changed: when to take this   hydrALAZINE 50 MG tablet Commonly known as: APRESOLINE Take 1 tablet (50 mg total) by mouth 3 (three) times daily. What changed: when to take this   methocarbamol 500 MG tablet Commonly known as: ROBAXIN Take 1 tablet (500 mg total) by mouth every 6 (six) hours as needed for muscle spasms.   metoprolol succinate 100 MG 24 hr tablet Commonly known as: TOPROL-XL Take 1 tablet (100 mg total) by mouth daily. What changed: when to take this   torsemide 20 MG tablet Commonly known as: DEMADEX Take 40 mg by mouth daily.   venlafaxine XR 150 MG 24 hr capsule Commonly known as: EFFEXOR-XR Take 150 mg by mouth daily.            Discharge Care Instructions  (From admission, onward)         Start     Ordered   09/13/19 0000  Discharge wound care:       Comments: Continue wound care   09/13/19 0857          Follow-up Information    Evans Lance, MD Follow up.   Specialty: Cardiology Why: 10/19/2019 @ 8:30AM, discuss heart monitor removal Contact information: [address] [place] [address] [phone]              Allergies  Allergen Reactions  . Hydrochlorothiazide Nausea And Vomiting    Consultations: Cardiology,nephrology  Procedures/Studies: NM Myocar Multi W/Spect W/Wall Motion / EF  Result Date: 09/12/2019  There was no ST segment deviation noted during stress.  T wave inversion was noted during stress in the V5 and V6 leads. T wave inversion persisted.  The study is normal.  This is a low risk study.  Nuclear stress EF: 59%.  Low risk stress nuclear study with normal perfusion and normal left  ventricular regional and global systolic function.  ECHOCARDIOGRAM COMPLETE  Result Date: 09/12/2019    ECHOCARDIOGRAM REPORT   Patient Name:   Kelli [name] Date of Exam: 09/12/2019 Medical Rec #:  [id number]     Height:       65.0 in Accession #:    [id number]    Weight:       200.2 lb Date of Birth:  [birth year]      BSA:          1.979 m Patient Age:    68 years      BP:           112/68 mmHg Patient Gender: F             HR:           97 bpm. Exam Location:  Inpatient Procedure: 2D Echo, Cardiac Doppler and Color Doppler Indications:    Acute Myocardial Infarction 410.  History:        Patient has prior history of Echocardiogram examinations, most                 recent  01/08/2017. CHF, Carotid Disease and Stroke,                 Signs/Symptoms:Dyspnea; Risk Factors:Hypertension, Diabetes and                 Dyslipidemia. CKD.  Sonographer:    Jonelle Sidle Dance Referring Phys: [id number] RAFAL POPLAWSKI IMPRESSIONS  1. Left ventricular ejection fraction, by estimation, is 60 to 65%. The left ventricle has normal function. The left ventricle has no regional wall motion abnormalities. There is moderate concentric left ventricular hypertrophy. Left ventricular diastolic parameters are consistent with Grade I diastolic dysfunction (impaired relaxation).  2. Right ventricular systolic function is normal. The right ventricular size is normal. There is normal pulmonary artery systolic pressure.  3. Left atrial size was severely dilated.  4. The mitral valve is normal in structure. No evidence of mitral valve regurgitation. No evidence of mitral stenosis.  5. The aortic valve is tricuspid. Aortic valve regurgitation is not visualized. No aortic stenosis is present.  6. The inferior vena cava is normal in size with greater than 50% respiratory variability, suggesting right atrial pressure of 3 mmHg. FINDINGS  Left Ventricle: Left ventricular ejection fraction, by estimation, is 60 to 65%. The left ventricle has normal  function. The left ventricle has no regional wall motion abnormalities. The left ventricular internal cavity size was normal in size. There is  moderate concentric left ventricular hypertrophy. Left ventricular diastolic parameters are consistent with Grade I diastolic dysfunction (impaired relaxation). Right Ventricle: The right ventricular size is normal. No increase in right ventricular wall thickness. Right ventricular systolic function is normal. There is normal pulmonary artery systolic pressure. The tricuspid regurgitant velocity is 2.14 m/s, and  with an assumed right atrial pressure of 3 mmHg, the estimated right ventricular systolic pressure is 63.0 mmHg. Left Atrium: Left atrial size was severely dilated. Right Atrium: Right atrial size was normal in size. Pericardium: There is no evidence of pericardial effusion. Mitral Valve: The mitral valve is normal in structure. Normal mobility of the mitral valve leaflets. No evidence of mitral valve regurgitation. No evidence of mitral valve stenosis. Tricuspid Valve: The tricuspid valve is normal in structure. Tricuspid valve regurgitation is trivial. No evidence of tricuspid stenosis. Aortic Valve: The aortic valve is tricuspid. . There is mild thickening and mild calcification of the aortic valve. Aortic valve regurgitation is not visualized. No aortic stenosis is present. There is mild thickening of the aortic valve. There is mild calcification of the aortic valve. Pulmonic Valve: The pulmonic valve was normal in structure. Pulmonic valve regurgitation is not visualized. No evidence of pulmonic stenosis. Aorta: The aortic root is normal in size and structure. Venous: The inferior vena cava is normal in size with greater than 50% respiratory variability, suggesting right atrial pressure of 3 mmHg. IAS/Shunts: No atrial level shunt detected by color flow Doppler.  LEFT VENTRICLE PLAX 2D LVIDd:         3.61 cm LVIDs:         2.41 cm LV PW:         1.54 cm LV IVS:         1.44 cm LVOT diam:     1.90 cm LV SV:         64 LV SV Index:   32 LVOT Area:     2.84 cm  RIGHT VENTRICLE            IVC RV Basal diam:  2.30 cm    IVC  diam: 1.61 cm RV S prime:     8.92 cm/s TAPSE (M-mode): 2.0 cm LEFT ATRIUM             Index       RIGHT ATRIUM           Index LA diam:        4.90 cm 2.48 cm/m  RA Area:     12.50 cm LA Vol (A2C):   97.3 ml 49.17 ml/m RA Volume:   24.60 ml  12.43 ml/m LA Vol (A4C):   97.9 ml 49.47 ml/m LA Biplane Vol: 99.1 ml 50.08 ml/m  AORTIC VALVE LVOT Vmax:   106.00 cm/s LVOT Vmean:  68.100 cm/s LVOT VTI:    0.225 m  AORTA Ao Root diam: 2.90 cm Ao Asc diam:  3.20 cm MITRAL VALVE                TRICUSPID VALVE MV Area (PHT): 2.56 cm     TR Peak grad:   18.3 mmHg MV Decel Time: 296 msec     TR Vmax:        214.00 cm/s MV E velocity: 161.00 cm/s MV A velocity: 170.00 cm/s  SHUNTS MV E/A ratio:  0.95         Systemic VTI:  0.22 m                             Systemic Diam: 1.90 cm Skeet Latch MD Electronically signed by Skeet Latch MD Signature Date/Time: 09/12/2019/1:25:51 PM    Final        Subjective: Patient seen and examined at the bedside this morning.  Hemodynamically stable for discharge.  Discharge Exam: Vitals:   09/13/19 0342 09/13/19 0805  BP: 129/73 111/67  Pulse: 95 95  Resp: 16   Temp: 98.6 F (37 C)   SpO2: 95% 95%   Vitals:   09/12/19 1644 09/12/19 1949 09/13/19 0342 09/13/19 0805  BP: 115/81 107/68 129/73 111/67  Pulse: 92 92 95 95  Resp: 18 15 16    Temp: 98.4 F (36.9 C) 98.9 F (37.2 C) 98.6 F (37 C)   TempSrc: Oral Oral Oral   SpO2: 95% 95% 95% 95%  Weight:   91.2 kg     General: Pt is alert, awake, not in acute distress,obese Cardiovascular: RRR, S1/S2 +, no rubs, no gallops Respiratory: CTA bilaterally, no wheezing, no rhonchi Abdominal: Soft, NT, ND, bowel sounds + Extremities: no edema, no cyanosis    The results of significant diagnostics from this hospitalization (including imaging,  microbiology, ancillary and laboratory) are listed below for reference.     Microbiology: No results found for this or any previous visit (from the past 240 hour(s)).   Labs: BNP (last 3 results) No results for input(s): BNP in the last 8760 hours. Basic Metabolic Panel: Recent Labs  Lab 09/12/19 0049  NA 137  K 3.6  CL 96*  CO2 24  GLUCOSE 260*  BUN 27*  CREATININE 4.47*  CALCIUM 6.9*  MG 1.5*   Liver Function Tests: No results for input(s): AST, ALT, ALKPHOS, BILITOT, PROT, ALBUMIN in the last 168 hours. No results for input(s): LIPASE, AMYLASE in the last 168 hours. No results for input(s): AMMONIA in the last 168 hours. CBC: Recent Labs  Lab 09/12/19 0049 09/13/19 0755  WBC 14.4* 11.1*  NEUTROABS  --  7.9*  HGB 9.9* 9.0*  HCT 31.6* 28.7*  MCV 95.8 98.3  PLT 297 306   Cardiac Enzymes: No results for input(s): CKTOTAL, CKMB, CKMBINDEX, TROPONINI in the last 168 hours. BNP: Invalid input(s): POCBNP CBG: Recent Labs  Lab 09/12/19 0729 09/12/19 1334 09/12/19 1642 09/12/19 2154 09/13/19 0748  GLUCAP 173* 255* 188* 196* 146*   D-Dimer No results for input(s): DDIMER in the last 72 hours. Hgb A1c Recent Labs    09/12/19 0049  HGBA1C 7.7*   Lipid Profile Recent Labs    09/12/19 0049  CHOL 119  HDL 24*  LDLCALC 36  TRIG 296*  CHOLHDL 5.0   Thyroid function studies No results for input(s): TSH, T4TOTAL, T3FREE, THYROIDAB in the last 72 hours.  Invalid input(s): FREET3 Anemia work up No results for input(s): VITAMINB12, FOLATE, FERRITIN, TIBC, IRON, RETICCTPCT in the last 72 hours. Urinalysis    Component Value Date/Time   COLORURINE YELLOW 01/10/2017 1150   APPEARANCEUR CLOUDY (A) 01/10/2017 1150   LABSPEC 1.011 01/10/2017 1150   PHURINE 7.0 01/10/2017 1150   GLUCOSEU NEGATIVE 01/10/2017 1150   HGBUR NEGATIVE 01/10/2017 1150   BILIRUBINUR NEGATIVE 01/10/2017 1150   KETONESUR NEGATIVE 01/10/2017 1150   PROTEINUR 100 (A) 01/10/2017 1150    UROBILINOGEN 0.2 04/25/2014 2315   NITRITE NEGATIVE 01/10/2017 1150   LEUKOCYTESUR MODERATE (A) 01/10/2017 1150   Sepsis Labs Invalid input(s): PROCALCITONIN,  WBC,  LACTICIDVEN Microbiology No results found for this or any previous visit (from the past 240 hour(s)).  Please note: You were cared for by a hospitalist during your hospital stay. Once you are discharged, your primary care physician will handle any further medical issues. Please note that NO REFILLS for any discharge medications will be authorized once you are discharged, as it is imperative that you return to your primary care physician (or establish a relationship with a primary care physician if you do not have one) for your post hospital discharge needs so that they can reassess your need for medications and monitor your lab values.    Time coordinating discharge: 40 minutes  SIGNED:   Shelly Coss, MD  [facility] 09/13/2019, 8:58 AM Pager [phone]  If 7PM-7AM, please contact night-coverage [email or web address] Password [email or web address]

## 2019-10-19 ENCOUNTER — Encounter: Payer: Medicare Other | Admitting: Internal Medicine

## 2019-12-19 ENCOUNTER — Encounter: Payer: Self-pay | Admitting: Internal Medicine

## 2019-12-19 ENCOUNTER — Ambulatory Visit (INDEPENDENT_AMBULATORY_CARE_PROVIDER_SITE_OTHER): Payer: Medicare Other | Admitting: Internal Medicine

## 2019-12-19 ENCOUNTER — Other Ambulatory Visit: Payer: Self-pay

## 2019-12-19 VITALS — BP 110/82 | HR 68 | Ht 65.0 in | Wt 198.0 lb

## 2019-12-19 DIAGNOSIS — I639 Cerebral infarction, unspecified: Secondary | ICD-10-CM | POA: Diagnosis not present

## 2019-12-19 NOTE — Progress Notes (Signed)
HPI Kelli [name] returns today for followup. She is a pleasant 69 yo woman with recurrent cryptogenic strokes and multiple motor deficits. She had an ILR placed which has not shown atrial fib. Her bp has been difficult to control though lately better. She has not had any pain around her ILR insertion site. She notes that HD is going well with no difficulty.  Allergies  Allergen Reactions   Hydrochlorothiazide Nausea And Vomiting     Current Outpatient Medications  Medication Sig Dispense Refill   acetaminophen (TYLENOL) 500 MG tablet Take 1,000 mg by mouth every 6 (six) hours as needed for moderate pain or headache.     amLODipine (NORVASC) 10 MG tablet Take 10 mg by mouth every evening.      atorvastatin (LIPITOR) 40 MG tablet Take 40 mg by mouth every evening.      bismuth subsalicylate (PEPTO BISMOL) 262 MG/15ML suspension Take 30 mLs by mouth every 6 (six) hours as needed for indigestion.     clonazePAM (KLONOPIN) 0.5 MG tablet Take 0.5 mg by mouth daily.      clopidogrel (PLAVIX) 75 MG tablet Take 1 tablet (75 mg total) by mouth daily. (Patient taking differently: Take 75 mg by mouth every evening. )     hydrALAZINE (APRESOLINE) 50 MG tablet Take 1 tablet (50 mg total) by mouth 3 (three) times daily. (Patient taking differently: Take 50 mg by mouth 2 (two) times daily. ) 90 tablet 0   Insulin Glargine (BASAGLAR KWIKPEN) 100 UNIT/ML SOPN Inject 15 Units into the skin daily.     methocarbamol (ROBAXIN) 500 MG tablet Take 1 tablet (500 mg total) by mouth every 6 (six) hours as needed for muscle spasms. 30 tablet 0   metoprolol succinate (TOPROL-XL) 100 MG 24 hr tablet Take 1 tablet (100 mg total) by mouth daily. (Patient taking differently: Take 100 mg by mouth every evening. ) 30 tablet 0   torsemide (DEMADEX) 20 MG tablet Take 40 mg by mouth daily.     venlafaxine XR (EFFEXOR-XR) 150 MG 24 hr capsule Take 150 mg by mouth daily.      No current facility-administered  medications for this visit.   Facility-Administered Medications Ordered in Other Visits  Medication Dose Route Frequency Provider Last Rate Last Admin   0.9 %  sodium chloride infusion   Intravenous Continuous Monia Sabal, PA-C   New Bag at 09/06/18 1206     Past Medical History:  Diagnosis Date   Anemia    Anxiety    Carotid artery occlusion    Occluded RICA, status post left CEA  August 2014 - Dr. Donnetta Hutching   Cerebral infarction [facility]) Aug 2014   Bihemispheric watershed infarcts   Cerebral infarction involving left cerebellar artery [facility]) Feb 2015   CHF (congestive heart failure) (HCC)    CKD (chronic kidney disease) stage 3, GFR 30-59 ml/min (HCC)    dialysis [place]   Closed dislocation of left humerus 07/26/2013   Depression    DM (diabetes mellitus), type 2 (HCC)    Dyspnea    with exertion   Essential hypertension, benign    Fibromyalgia    Mixed hyperlipidemia    Multiple gastric ulcers    Pneumonia    Stroke ([place])    pt states she cannot walk, left arm weakness   Urinary incontinence     ROS:   All systems reviewed and negative except as noted in the HPI.   Past Surgical History:  Procedure  Laterality Date   A/V FISTULAGRAM N/A 06/06/2019   Procedure: A/V FISTULAGRAM;  Surgeon: Marty Heck, MD;  Location: [place] [facility];  Service: Cardiovascular;  Laterality: N/A;   AV FISTULA PLACEMENT Left 04/15/2017   Procedure: ARTERIOVENOUS (AV) FISTULA CREATION LEFT ARM;  Surgeon: Serafina Mitchell, MD;  Location: [facility];  Service: Vascular;  Laterality: Left;   AV FISTULA PLACEMENT Left 05/09/2017   Procedure: INSERTION OF ARTERIOVENOUS (AV) GORE-TEX GRAFT LEFT UPPER ARM;  Surgeon: [name], MD;  Location: [place];  Service: Vascular;  Laterality: Left;   AV FISTULA PLACEMENT Right 09/06/2018   Procedure: INSERTION OF ARTERIOVENOUS (AV) GORE-TEX GRAFT RIGHT  ARM;  Surgeon: [name], MD;  Location: [facility];  Service: Vascular;  Laterality:  Right;   [place] Right 08/09/2018   Procedure: BASILIC VEIN TRANSPOSITION 1st Stage;  Surgeon: [name], MD;  Location: [place];  Service: Vascular;  Laterality: Right;   BIOPSY  01/22/2017   Procedure: BIOPSY;  Surgeon: Danie Binder, MD;  [facility];  Service: Endoscopy;;  gastric   COMBINED HYSTERECTOMY VAGINAL W/ MMK / A&P REPAIR  1981   ENDARTERECTOMY Left 10/06/2012   Procedure: Carotid Endarterectomy with Finesse patch angioplasty;  Surgeon: [name], MD;  Location: [facility] OR;  Service: Vascular;  Laterality: Left;   ESOPHAGOGASTRODUODENOSCOPY N/A 04/26/2014   Procedure: ESOPHAGOGASTRODUODENOSCOPY (EGD);  Surgeon: Lear Ng, MD;  Location: [facility] ENDOSCOPY;  Service: Endoscopy;  Laterality: N/A;   ESOPHAGOGASTRODUODENOSCOPY N/A 01/22/2017   Procedure: ESOPHAGOGASTRODUODENOSCOPY (EGD);  Surgeon: Danie Binder, MD;  [facility];  Service: Endoscopy;  Laterality: N/A;   FLEXIBLE SIGMOIDOSCOPY N/A 01/22/2017   Procedure: FLEXIBLE SIGMOIDOSCOPY;  Surgeon: Danie Binder, MD;  [facility];  Service: Endoscopy;  Laterality: N/A;   IR CV LINE INJECTION  02/04/2017   IR CV LINE INJECTION  04/28/2018   IR CV LINE INJECTION  05/12/2018   IR CV LINE INJECTION  05/18/2018   IR FLUORO GUIDE CV LINE LEFT  01/14/2017   IR FLUORO GUIDE CV LINE LEFT  04/25/2017   IR FLUORO GUIDE CV LINE LEFT  05/13/2017   IR FLUORO GUIDE CV LINE LEFT  03/02/2018   IR FLUORO GUIDE CV LINE LEFT  04/28/2018   IR FLUORO GUIDE CV LINE LEFT  05/12/2018   IR FLUORO GUIDE CV LINE LEFT  05/18/2018   IR FLUORO GUIDE CV LINE LEFT  06/23/2018   IR FLUORO GUIDE CV LINE LEFT  08/02/2018   IR PTA VENOUS EXCEPT DIALYSIS CIRCUIT  04/28/2018   IR PTA VENOUS EXCEPT DIALYSIS CIRCUIT  05/12/2018   IR REMOVAL TUN CV CATH W/O FL  07/08/2017   IR REMOVAL TUN CV CATH W/O FL  10/11/2018   IR TRANSCATH RETRIEVAL FB INCL GUIDANCE (MS)  05/12/2018   IR US GUIDE VASC  ACCESS LEFT  01/14/2017   LOOP RECORDER IMPLANT  04/16/13   MDT LinQ implanted for cryptogenic stroke   PERIPHERAL VASCULAR INTERVENTION Right 06/06/2019   Procedure: PERIPHERAL VASCULAR INTERVENTION;  Surgeon: Marty Heck, MD;  Location: [place] [facility];  Service: Cardiovascular;  Laterality: Right;   [name] WITHOUT CARDIOVERSION N/A 04/16/2013   Procedure: TRANSESOPHAGEAL ECHOCARDIOGRAM ([name]);  Surgeon: Josue Hector, MD;  Location: [place];  Service: Cardiovascular;  Laterality: N/A;   THROMBECTOMY AND REVISION OF ARTERIOVENTOUS (AV) GORETEX  GRAFT Left 07/24/2017   Procedure: THROMBECTOMY OF LEFT UPPER ARM ARTERIOVENTOUS (AV) GRAFT;  Surgeon:  Conrad Huey, MD;  Location: [facility] OR;  Service: Vascular;  Laterality: Left;   URETHRAL DILATION  1980's   VAGINAL HYSTERECTOMY  1981   "partial" (10/04/2012)     Family History  Problem Relation Age of Onset   Diabetes Mother    Diabetes Father    Hypertension Sister    Diabetes Brother    Seizures Son    Kidney disease Maternal Grandmother    Hypertension Maternal Grandmother    Heart disease Maternal Grandfather    Diabetes Paternal Grandmother    Heart disease Paternal Grandfather      Social History   Socioeconomic History   Marital status: Married    Spouse name: Not on file   Number of children: Not on file   Years of education: Not on file   Highest education level: Not on file  Occupational History   Not on file  Tobacco Use   Smoking status: Former Smoker    Packs/day: 1.00    Years: 44.00    Pack years: 44.00    Types: Cigarettes    Quit date: 10/03/2012    Years since quitting: 7.2   Smokeless tobacco: Never Used  Vaping Use   Vaping Use: Never used  Substance and Sexual Activity   Alcohol use: No    Alcohol/week: 0.0 standard drinks   Drug use: No   Sexual activity: Yes    Birth control/protection: Surgical  Other Topics Concern   Not on file  Social History Narrative    INITIALLY WORKED [place]. 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   Social Determinants of Health   Financial Resource Strain:    Difficulty of Paying Living Expenses: Not on file  Food Insecurity:    Worried About Charity fundraiser in the Last Year: Not on file   [employer] of Food in the Last Year: Not on file  Transportation Needs:    Lack of Transportation (Medical): Not on file   Lack of Transportation (Non-Medical): Not on file  Physical Activity:    Days of Exercise per Week: Not on file   Minutes of Exercise per Session: Not on file  Stress:    Feeling of Stress : Not on file  Social Connections:    Frequency of Communication with Friends and Family: Not on file   Frequency of Social Gatherings with Friends and Family: Not on file   Attends Religious Services: Not on file   Active Member of [place] or Organizations: Not on file   Attends Archivist Meetings: Not on file   Marital Status: Not on file  Intimate Partner Violence:    Fear of Current or Ex-Partner: Not on file   Emotionally Abused: Not on file   Physically Abused: Not on file   Sexually Abused: Not on file     Ht 5\' 5"  (1.651 m)    Wt 198 lb (89.8 kg)    BMI 32.95 kg/m   Physical Exam:  Well appearing NAD HEENT: Unremarkable Neck:  No JVD, no thyromegally Lymphatics:  No adenopathy Back:  No CVA tenderness Lungs:  Clear HEART:  Regular rate rhythm, no murmurs, no rubs, no clicks Abd:  soft, positive bowel sounds, no organomegally, no rebound, no guarding Ext:  2 plus pulses, no edema, no cyanosis, no clubbing Skin:  No rashes no nodules Neuro:  CN II through XII intact, motor grossly intact  Assess/Plan: 1. Cryptogenic stroke - she has  not had any evidence of atrial fib.  2. ILR - her device is past end of service. We discussed removal and leaving it in. I told her it was up to her but that leaving it in place would not be a  problem.  3. HTN -her bp has been better. She will avoid salty food. 4. Vascular disease - she denies claudication or angina. No change in her meds.  Carleene Overlie Delois Tolbert,MD

## 2019-12-19 NOTE — Patient Instructions (Signed)
Medication Instructions:  °Your physician recommends that you continue on your current medications as directed. Please refer to the Current Medication list given to you today. ° °*If you need a refill on your cardiac medications before your next appointment, please call your pharmacy* ° ° °Lab Work: °NONE  ° °If you have labs (blood work) drawn today and your tests are completely normal, you will receive your results only by: °MyChart Message (if you have MyChart) OR °A paper copy in the mail °If you have any lab test that is abnormal or we need to change your treatment, we will call you to review the results. ° ° °Testing/Procedures: °NONE  ° ° °Follow-Up: °At [facility], you and your health needs are our priority.  As part of our continuing mission to provide you with exceptional heart care, we have created designated Provider Care Teams.  These Care Teams include your primary Cardiologist (physician) and Advanced Practice Providers (APPs -  Physician Assistants and Nurse Practitioners) who all work together to provide you with the care you need, when you need it. ° °We recommend signing up for the patient portal called "MyChart".  Sign up information is provided on this After Visit Summary.  MyChart is used to connect with patients for Virtual Visits (Telemedicine).  Patients are able to view lab/test results, encounter notes, upcoming appointments, etc.  Non-urgent messages can be sent to your provider as well.   °To learn more about what you can do with MyChart, go to [email or web address].   ° °Your next appointment:   ° As Needed  ° °The format for your next appointment:   °In Person ° °Provider:   °Gregg Taylor, MD  ° ° °Other Instructions °Thank you for choosing [facility]! °  ° ° °

## 2020-03-01 DIAGNOSIS — N186 End stage renal disease: Secondary | ICD-10-CM | POA: Diagnosis not present

## 2020-03-01 DIAGNOSIS — D509 Iron deficiency anemia, unspecified: Secondary | ICD-10-CM | POA: Diagnosis not present

## 2020-03-01 DIAGNOSIS — N2581 Secondary hyperparathyroidism of renal origin: Secondary | ICD-10-CM | POA: Diagnosis not present

## 2020-03-01 DIAGNOSIS — D631 Anemia in chronic kidney disease: Secondary | ICD-10-CM | POA: Diagnosis not present

## 2020-03-01 DIAGNOSIS — E559 Vitamin D deficiency, unspecified: Secondary | ICD-10-CM | POA: Diagnosis not present

## 2020-03-01 DIAGNOSIS — Z992 Dependence on renal dialysis: Secondary | ICD-10-CM | POA: Diagnosis not present

## 2020-03-04 DIAGNOSIS — N186 End stage renal disease: Secondary | ICD-10-CM | POA: Diagnosis not present

## 2020-03-04 DIAGNOSIS — N2581 Secondary hyperparathyroidism of renal origin: Secondary | ICD-10-CM | POA: Diagnosis not present

## 2020-03-04 DIAGNOSIS — Z992 Dependence on renal dialysis: Secondary | ICD-10-CM | POA: Diagnosis not present

## 2020-03-04 DIAGNOSIS — E559 Vitamin D deficiency, unspecified: Secondary | ICD-10-CM | POA: Diagnosis not present

## 2020-03-04 DIAGNOSIS — D631 Anemia in chronic kidney disease: Secondary | ICD-10-CM | POA: Diagnosis not present

## 2020-03-04 DIAGNOSIS — D509 Iron deficiency anemia, unspecified: Secondary | ICD-10-CM | POA: Diagnosis not present

## 2020-03-06 DIAGNOSIS — Z992 Dependence on renal dialysis: Secondary | ICD-10-CM | POA: Diagnosis not present

## 2020-03-06 DIAGNOSIS — D509 Iron deficiency anemia, unspecified: Secondary | ICD-10-CM | POA: Diagnosis not present

## 2020-03-06 DIAGNOSIS — D631 Anemia in chronic kidney disease: Secondary | ICD-10-CM | POA: Diagnosis not present

## 2020-03-06 DIAGNOSIS — E559 Vitamin D deficiency, unspecified: Secondary | ICD-10-CM | POA: Diagnosis not present

## 2020-03-06 DIAGNOSIS — N2581 Secondary hyperparathyroidism of renal origin: Secondary | ICD-10-CM | POA: Diagnosis not present

## 2020-03-06 DIAGNOSIS — N186 End stage renal disease: Secondary | ICD-10-CM | POA: Diagnosis not present

## 2020-03-08 DIAGNOSIS — E559 Vitamin D deficiency, unspecified: Secondary | ICD-10-CM | POA: Diagnosis not present

## 2020-03-08 DIAGNOSIS — Z992 Dependence on renal dialysis: Secondary | ICD-10-CM | POA: Diagnosis not present

## 2020-03-08 DIAGNOSIS — D631 Anemia in chronic kidney disease: Secondary | ICD-10-CM | POA: Diagnosis not present

## 2020-03-08 DIAGNOSIS — N186 End stage renal disease: Secondary | ICD-10-CM | POA: Diagnosis not present

## 2020-03-08 DIAGNOSIS — N2581 Secondary hyperparathyroidism of renal origin: Secondary | ICD-10-CM | POA: Diagnosis not present

## 2020-03-08 DIAGNOSIS — D509 Iron deficiency anemia, unspecified: Secondary | ICD-10-CM | POA: Diagnosis not present

## 2020-03-11 DIAGNOSIS — N2581 Secondary hyperparathyroidism of renal origin: Secondary | ICD-10-CM | POA: Diagnosis not present

## 2020-03-11 DIAGNOSIS — N186 End stage renal disease: Secondary | ICD-10-CM | POA: Diagnosis not present

## 2020-03-11 DIAGNOSIS — E559 Vitamin D deficiency, unspecified: Secondary | ICD-10-CM | POA: Diagnosis not present

## 2020-03-11 DIAGNOSIS — Z992 Dependence on renal dialysis: Secondary | ICD-10-CM | POA: Diagnosis not present

## 2020-03-11 DIAGNOSIS — D509 Iron deficiency anemia, unspecified: Secondary | ICD-10-CM | POA: Diagnosis not present

## 2020-03-11 DIAGNOSIS — D631 Anemia in chronic kidney disease: Secondary | ICD-10-CM | POA: Diagnosis not present

## 2020-03-13 ENCOUNTER — Other Ambulatory Visit: Payer: Self-pay | Admitting: *Deleted

## 2020-03-13 DIAGNOSIS — Z992 Dependence on renal dialysis: Secondary | ICD-10-CM | POA: Diagnosis not present

## 2020-03-13 DIAGNOSIS — N186 End stage renal disease: Secondary | ICD-10-CM | POA: Diagnosis not present

## 2020-03-13 DIAGNOSIS — N2581 Secondary hyperparathyroidism of renal origin: Secondary | ICD-10-CM | POA: Diagnosis not present

## 2020-03-13 DIAGNOSIS — D509 Iron deficiency anemia, unspecified: Secondary | ICD-10-CM | POA: Diagnosis not present

## 2020-03-13 DIAGNOSIS — E559 Vitamin D deficiency, unspecified: Secondary | ICD-10-CM | POA: Diagnosis not present

## 2020-03-13 DIAGNOSIS — D631 Anemia in chronic kidney disease: Secondary | ICD-10-CM | POA: Diagnosis not present

## 2020-03-15 DIAGNOSIS — Z992 Dependence on renal dialysis: Secondary | ICD-10-CM | POA: Diagnosis not present

## 2020-03-15 DIAGNOSIS — D509 Iron deficiency anemia, unspecified: Secondary | ICD-10-CM | POA: Diagnosis not present

## 2020-03-15 DIAGNOSIS — E559 Vitamin D deficiency, unspecified: Secondary | ICD-10-CM | POA: Diagnosis not present

## 2020-03-15 DIAGNOSIS — D631 Anemia in chronic kidney disease: Secondary | ICD-10-CM | POA: Diagnosis not present

## 2020-03-15 DIAGNOSIS — N2581 Secondary hyperparathyroidism of renal origin: Secondary | ICD-10-CM | POA: Diagnosis not present

## 2020-03-15 DIAGNOSIS — N186 End stage renal disease: Secondary | ICD-10-CM | POA: Diagnosis not present

## 2020-03-20 DIAGNOSIS — N186 End stage renal disease: Secondary | ICD-10-CM | POA: Diagnosis not present

## 2020-03-20 DIAGNOSIS — D509 Iron deficiency anemia, unspecified: Secondary | ICD-10-CM | POA: Diagnosis not present

## 2020-03-20 DIAGNOSIS — Z992 Dependence on renal dialysis: Secondary | ICD-10-CM | POA: Diagnosis not present

## 2020-03-20 DIAGNOSIS — N2581 Secondary hyperparathyroidism of renal origin: Secondary | ICD-10-CM | POA: Diagnosis not present

## 2020-03-20 DIAGNOSIS — E559 Vitamin D deficiency, unspecified: Secondary | ICD-10-CM | POA: Diagnosis not present

## 2020-03-20 DIAGNOSIS — D631 Anemia in chronic kidney disease: Secondary | ICD-10-CM | POA: Diagnosis not present

## 2020-03-25 DIAGNOSIS — N2581 Secondary hyperparathyroidism of renal origin: Secondary | ICD-10-CM | POA: Diagnosis not present

## 2020-03-25 DIAGNOSIS — Z992 Dependence on renal dialysis: Secondary | ICD-10-CM | POA: Diagnosis not present

## 2020-03-25 DIAGNOSIS — N186 End stage renal disease: Secondary | ICD-10-CM | POA: Diagnosis not present

## 2020-03-25 DIAGNOSIS — E559 Vitamin D deficiency, unspecified: Secondary | ICD-10-CM | POA: Diagnosis not present

## 2020-03-25 DIAGNOSIS — D509 Iron deficiency anemia, unspecified: Secondary | ICD-10-CM | POA: Diagnosis not present

## 2020-03-25 DIAGNOSIS — D631 Anemia in chronic kidney disease: Secondary | ICD-10-CM | POA: Diagnosis not present

## 2020-03-27 DIAGNOSIS — Z992 Dependence on renal dialysis: Secondary | ICD-10-CM | POA: Diagnosis not present

## 2020-03-27 DIAGNOSIS — N186 End stage renal disease: Secondary | ICD-10-CM | POA: Diagnosis not present

## 2020-03-27 DIAGNOSIS — D509 Iron deficiency anemia, unspecified: Secondary | ICD-10-CM | POA: Diagnosis not present

## 2020-03-27 DIAGNOSIS — N2581 Secondary hyperparathyroidism of renal origin: Secondary | ICD-10-CM | POA: Diagnosis not present

## 2020-03-27 DIAGNOSIS — E559 Vitamin D deficiency, unspecified: Secondary | ICD-10-CM | POA: Diagnosis not present

## 2020-03-27 DIAGNOSIS — D631 Anemia in chronic kidney disease: Secondary | ICD-10-CM | POA: Diagnosis not present

## 2020-03-28 ENCOUNTER — Inpatient Hospital Stay (HOSPITAL_COMMUNITY): Admission: RE | Admit: 2020-03-28 | Payer: Medicare Other | Source: Ambulatory Visit

## 2020-03-28 ENCOUNTER — Other Ambulatory Visit (HOSPITAL_COMMUNITY): Payer: Medicare Other

## 2020-03-28 ENCOUNTER — Ambulatory Visit: Payer: Medicare Other | Admitting: Vascular Surgery

## 2020-03-28 DIAGNOSIS — E559 Vitamin D deficiency, unspecified: Secondary | ICD-10-CM | POA: Diagnosis not present

## 2020-03-28 DIAGNOSIS — Z992 Dependence on renal dialysis: Secondary | ICD-10-CM | POA: Diagnosis not present

## 2020-03-28 DIAGNOSIS — D631 Anemia in chronic kidney disease: Secondary | ICD-10-CM | POA: Diagnosis not present

## 2020-03-28 DIAGNOSIS — N186 End stage renal disease: Secondary | ICD-10-CM | POA: Diagnosis not present

## 2020-03-28 DIAGNOSIS — N2581 Secondary hyperparathyroidism of renal origin: Secondary | ICD-10-CM | POA: Diagnosis not present

## 2020-03-28 DIAGNOSIS — D509 Iron deficiency anemia, unspecified: Secondary | ICD-10-CM | POA: Diagnosis not present

## 2020-03-31 DIAGNOSIS — Z992 Dependence on renal dialysis: Secondary | ICD-10-CM | POA: Diagnosis not present

## 2020-03-31 DIAGNOSIS — N186 End stage renal disease: Secondary | ICD-10-CM | POA: Diagnosis not present

## 2020-04-01 DIAGNOSIS — I959 Hypotension, unspecified: Secondary | ICD-10-CM | POA: Diagnosis not present

## 2020-04-01 DIAGNOSIS — D509 Iron deficiency anemia, unspecified: Secondary | ICD-10-CM | POA: Diagnosis not present

## 2020-04-01 DIAGNOSIS — R197 Diarrhea, unspecified: Secondary | ICD-10-CM | POA: Diagnosis not present

## 2020-04-01 DIAGNOSIS — N2581 Secondary hyperparathyroidism of renal origin: Secondary | ICD-10-CM | POA: Diagnosis not present

## 2020-04-01 DIAGNOSIS — R531 Weakness: Secondary | ICD-10-CM | POA: Diagnosis not present

## 2020-04-01 DIAGNOSIS — D631 Anemia in chronic kidney disease: Secondary | ICD-10-CM | POA: Diagnosis not present

## 2020-04-01 DIAGNOSIS — Z992 Dependence on renal dialysis: Secondary | ICD-10-CM | POA: Diagnosis not present

## 2020-04-01 DIAGNOSIS — N186 End stage renal disease: Secondary | ICD-10-CM | POA: Diagnosis not present

## 2020-04-03 DIAGNOSIS — N186 End stage renal disease: Secondary | ICD-10-CM | POA: Diagnosis not present

## 2020-04-03 DIAGNOSIS — Z992 Dependence on renal dialysis: Secondary | ICD-10-CM | POA: Diagnosis not present

## 2020-04-03 DIAGNOSIS — I959 Hypotension, unspecified: Secondary | ICD-10-CM | POA: Diagnosis not present

## 2020-04-03 DIAGNOSIS — D631 Anemia in chronic kidney disease: Secondary | ICD-10-CM | POA: Diagnosis not present

## 2020-04-03 DIAGNOSIS — D509 Iron deficiency anemia, unspecified: Secondary | ICD-10-CM | POA: Diagnosis not present

## 2020-04-03 DIAGNOSIS — N2581 Secondary hyperparathyroidism of renal origin: Secondary | ICD-10-CM | POA: Diagnosis not present

## 2020-04-05 DIAGNOSIS — Z992 Dependence on renal dialysis: Secondary | ICD-10-CM | POA: Diagnosis not present

## 2020-04-05 DIAGNOSIS — D509 Iron deficiency anemia, unspecified: Secondary | ICD-10-CM | POA: Diagnosis not present

## 2020-04-05 DIAGNOSIS — I959 Hypotension, unspecified: Secondary | ICD-10-CM | POA: Diagnosis not present

## 2020-04-05 DIAGNOSIS — N186 End stage renal disease: Secondary | ICD-10-CM | POA: Diagnosis not present

## 2020-04-05 DIAGNOSIS — D631 Anemia in chronic kidney disease: Secondary | ICD-10-CM | POA: Diagnosis not present

## 2020-04-05 DIAGNOSIS — N2581 Secondary hyperparathyroidism of renal origin: Secondary | ICD-10-CM | POA: Diagnosis not present

## 2020-04-08 DIAGNOSIS — D631 Anemia in chronic kidney disease: Secondary | ICD-10-CM | POA: Diagnosis not present

## 2020-04-08 DIAGNOSIS — I959 Hypotension, unspecified: Secondary | ICD-10-CM | POA: Diagnosis not present

## 2020-04-08 DIAGNOSIS — Z992 Dependence on renal dialysis: Secondary | ICD-10-CM | POA: Diagnosis not present

## 2020-04-08 DIAGNOSIS — N2581 Secondary hyperparathyroidism of renal origin: Secondary | ICD-10-CM | POA: Diagnosis not present

## 2020-04-08 DIAGNOSIS — D509 Iron deficiency anemia, unspecified: Secondary | ICD-10-CM | POA: Diagnosis not present

## 2020-04-08 DIAGNOSIS — N186 End stage renal disease: Secondary | ICD-10-CM | POA: Diagnosis not present

## 2020-04-10 DIAGNOSIS — I959 Hypotension, unspecified: Secondary | ICD-10-CM | POA: Diagnosis not present

## 2020-04-10 DIAGNOSIS — N2581 Secondary hyperparathyroidism of renal origin: Secondary | ICD-10-CM | POA: Diagnosis not present

## 2020-04-10 DIAGNOSIS — D631 Anemia in chronic kidney disease: Secondary | ICD-10-CM | POA: Diagnosis not present

## 2020-04-10 DIAGNOSIS — Z992 Dependence on renal dialysis: Secondary | ICD-10-CM | POA: Diagnosis not present

## 2020-04-10 DIAGNOSIS — N186 End stage renal disease: Secondary | ICD-10-CM | POA: Diagnosis not present

## 2020-04-10 DIAGNOSIS — D509 Iron deficiency anemia, unspecified: Secondary | ICD-10-CM | POA: Diagnosis not present

## 2020-04-12 DIAGNOSIS — D509 Iron deficiency anemia, unspecified: Secondary | ICD-10-CM | POA: Diagnosis not present

## 2020-04-12 DIAGNOSIS — N186 End stage renal disease: Secondary | ICD-10-CM | POA: Diagnosis not present

## 2020-04-12 DIAGNOSIS — N2581 Secondary hyperparathyroidism of renal origin: Secondary | ICD-10-CM | POA: Diagnosis not present

## 2020-04-12 DIAGNOSIS — I959 Hypotension, unspecified: Secondary | ICD-10-CM | POA: Diagnosis not present

## 2020-04-12 DIAGNOSIS — D631 Anemia in chronic kidney disease: Secondary | ICD-10-CM | POA: Diagnosis not present

## 2020-04-12 DIAGNOSIS — Z992 Dependence on renal dialysis: Secondary | ICD-10-CM | POA: Diagnosis not present

## 2020-04-15 DIAGNOSIS — Z992 Dependence on renal dialysis: Secondary | ICD-10-CM | POA: Diagnosis not present

## 2020-04-15 DIAGNOSIS — N2581 Secondary hyperparathyroidism of renal origin: Secondary | ICD-10-CM | POA: Diagnosis not present

## 2020-04-15 DIAGNOSIS — N186 End stage renal disease: Secondary | ICD-10-CM | POA: Diagnosis not present

## 2020-04-15 DIAGNOSIS — D631 Anemia in chronic kidney disease: Secondary | ICD-10-CM | POA: Diagnosis not present

## 2020-04-15 DIAGNOSIS — D509 Iron deficiency anemia, unspecified: Secondary | ICD-10-CM | POA: Diagnosis not present

## 2020-04-15 DIAGNOSIS — I959 Hypotension, unspecified: Secondary | ICD-10-CM | POA: Diagnosis not present

## 2020-04-16 ENCOUNTER — Other Ambulatory Visit: Payer: Self-pay

## 2020-04-16 ENCOUNTER — Emergency Department (HOSPITAL_COMMUNITY)
Admission: EM | Admit: 2020-04-16 | Discharge: 2020-04-16 | Disposition: A | Discharge status: Home / Self Care | Payer: Medicare Other | Attending: Emergency Medicine | Admitting: Emergency Medicine

## 2020-04-16 ENCOUNTER — Emergency Department (HOSPITAL_COMMUNITY): Payer: Medicare Other

## 2020-04-16 ENCOUNTER — Encounter (HOSPITAL_COMMUNITY): Payer: Self-pay

## 2020-04-16 DIAGNOSIS — Z794 Long term (current) use of insulin: Secondary | ICD-10-CM | POA: Diagnosis not present

## 2020-04-16 DIAGNOSIS — I5033 Acute on chronic diastolic (congestive) heart failure: Secondary | ICD-10-CM | POA: Diagnosis not present

## 2020-04-16 DIAGNOSIS — U071 COVID-19: Secondary | ICD-10-CM | POA: Diagnosis not present

## 2020-04-16 DIAGNOSIS — N186 End stage renal disease: Secondary | ICD-10-CM | POA: Diagnosis not present

## 2020-04-16 DIAGNOSIS — R5383 Other fatigue: Secondary | ICD-10-CM

## 2020-04-16 DIAGNOSIS — Z87891 Personal history of nicotine dependence: Secondary | ICD-10-CM | POA: Diagnosis not present

## 2020-04-16 DIAGNOSIS — E1122 Type 2 diabetes mellitus with diabetic chronic kidney disease: Secondary | ICD-10-CM | POA: Insufficient documentation

## 2020-04-16 DIAGNOSIS — Z7902 Long term (current) use of antithrombotics/antiplatelets: Secondary | ICD-10-CM | POA: Diagnosis not present

## 2020-04-16 DIAGNOSIS — I132 Hypertensive heart and chronic kidney disease with heart failure and with stage 5 chronic kidney disease, or end stage renal disease: Secondary | ICD-10-CM | POA: Diagnosis not present

## 2020-04-16 DIAGNOSIS — Z79899 Other long term (current) drug therapy: Secondary | ICD-10-CM | POA: Insufficient documentation

## 2020-04-16 DIAGNOSIS — Z8673 Personal history of transient ischemic attack (TIA), and cerebral infarction without residual deficits: Secondary | ICD-10-CM | POA: Insufficient documentation

## 2020-04-16 DIAGNOSIS — I517 Cardiomegaly: Secondary | ICD-10-CM | POA: Diagnosis not present

## 2020-04-16 DIAGNOSIS — Z992 Dependence on renal dialysis: Secondary | ICD-10-CM | POA: Insufficient documentation

## 2020-04-16 DIAGNOSIS — I10 Essential (primary) hypertension: Secondary | ICD-10-CM | POA: Diagnosis not present

## 2020-04-16 LAB — RESP PANEL BY RT-PCR (FLU A&B, COVID) ARPGX2
Influenza A by PCR: NEGATIVE
Influenza B by PCR: NEGATIVE
SARS Coronavirus 2 by RT PCR: POSITIVE — AB

## 2020-04-16 LAB — CBC WITH DIFFERENTIAL/PLATELET
Abs Immature Granulocytes: 0.05 10*3/uL (ref 0.00–0.07)
Basophils Absolute: 0 10*3/uL (ref 0.0–0.1)
Basophils Relative: 1 %
Eosinophils Absolute: 0.3 10*3/uL (ref 0.0–0.5)
Eosinophils Relative: 4 %
HCT: 39.3 % (ref 36.0–46.0)
Hemoglobin: 11.7 g/dL — ABNORMAL LOW (ref 12.0–15.0)
Immature Granulocytes: 1 %
Lymphocytes Relative: 20 %
Lymphs Abs: 1.3 10*3/uL (ref 0.7–4.0)
MCH: 28.8 pg (ref 26.0–34.0)
MCHC: 29.8 g/dL — ABNORMAL LOW (ref 30.0–36.0)
MCV: 96.8 fL (ref 80.0–100.0)
Monocytes Absolute: 0.7 10*3/uL (ref 0.1–1.0)
Monocytes Relative: 10 %
Neutro Abs: 4.3 10*3/uL (ref 1.7–7.7)
Neutrophils Relative %: 64 %
Platelets: 227 10*3/uL (ref 150–400)
RBC: 4.06 MIL/uL (ref 3.87–5.11)
RDW: 15.9 % — ABNORMAL HIGH (ref 11.5–15.5)
WBC: 6.6 10*3/uL (ref 4.0–10.5)
nRBC: 0 % (ref 0.0–0.2)

## 2020-04-16 LAB — BASIC METABOLIC PANEL
Anion gap: 9 (ref 5–15)
BUN: 19 mg/dL (ref 8–23)
CO2: 26 mmol/L (ref 22–32)
Calcium: 8.5 mg/dL — ABNORMAL LOW (ref 8.9–10.3)
Chloride: 102 mmol/L (ref 98–111)
Creatinine, Ser: 4.32 mg/dL — ABNORMAL HIGH (ref 0.44–1.00)
GFR, Estimated: 11 mL/min — ABNORMAL LOW (ref >60)
Glucose, Bld: 142 mg/dL — ABNORMAL HIGH (ref 70–99)
Potassium: 3.5 mmol/L (ref 3.5–5.1)
Sodium: 137 mmol/L (ref 135–145)

## 2020-04-16 LAB — TROPONIN I (HIGH SENSITIVITY): Troponin I (High Sensitivity): 19 ng/L — ABNORMAL HIGH (ref <18)

## 2020-04-16 NOTE — Discharge Instructions (Addendum)
If you had any testing done today, the results will show up on your MyChart phone app in 24 hours.  Call your primary care doctor in the next 1-2 days to arrange video follow-up.   Use a finger pulse oximeter at home.  You may purchase one at [facility] or [facility] or online.  If the numbers drops and stays below 90%, return immediately back to the ER.  Otherwise increase your fluid intake, isolate at home for 5 days after symptoms resolve, and inform recent close contacts of the need to test for Covid.  

## 2020-04-16 NOTE — ED Provider Notes (Signed)
[facility] EMERGENCY DEPARTMENT Provider Note   CSN: [id number] Arrival date & time: 04/16/20  1020     History Chief Complaint  Patient presents with  . Weakness    Kelli [name] is a 70 y.o. female.  Complaint of weakness fatigue sleepiness generalized body aches.  Symptoms ongoing for 10 days.  She had a Covid test done about a week ago that was negative.  She had dialysis this whole week without significant improvement.  Otherwise denies any fevers or cough.  No diarrhea no vomiting.        Past Medical History:  Diagnosis Date  . Anemia   . Anxiety   . Carotid artery occlusion    Occluded RICA, status post left CEA  August 2014 - Dr. Donnetta Hutching  . Cerebral infarction [facility]) Aug 2014   Bihemispheric watershed infarcts  . Cerebral infarction involving left cerebellar artery [facility]) Feb 2015  . CHF (congestive heart failure) ([place])   . CKD (chronic kidney disease) stage 3, GFR 30-59 ml/min (HCC)    dialysis [place]  . Closed dislocation of left humerus 07/26/2013  . Depression   . DM (diabetes mellitus), type 2 ([place])   . Dyspnea    with exertion  . Essential hypertension, benign   . Fibromyalgia   . Mixed hyperlipidemia   . Multiple gastric ulcers   . Pneumonia   . Stroke [facility])    pt states she cannot walk, left arm weakness  . Urinary incontinence     Patient Active Problem List   Diagnosis Date Noted  . Ischemic chest pain ([place]) 09/11/2019  . Chest pain 09/11/2019  . Occult GI bleeding 01/28/2017  . TIA (transient ischemic attack) 01/28/2017  . Congestive heart failure ([place])   . ESRD on hemodialysis ([place])   . Pressure injury of skin 01/23/2017  . ESRD (end stage renal disease) ([place])   . Gastritis and duodenitis   . Acute metabolic encephalopathy 93/71/6967  . Palliative care encounter   . Goals of care, counseling/discussion   . DNR (do not resuscitate) discussion   . Acute renal failure superimposed on stage 4 chronic kidney disease ([place]) 01/09/2017  .  Precordial pain   . CKD (chronic kidney disease), stage IV ([place]) 01/07/2017  . Acute respiratory failure with hypoxia ([place]) 01/07/2017  . Anemia 01/06/2017  . Diabetes mellitus type 2 in obese ([place]) 01/06/2017  . B12 deficiency 11/18/2016  . Anemia in chronic kidney disease 11/18/2016  . PUD (peptic ulcer disease) 10/13/2016  . Pacemaker 10/13/2016  . Acute on chronic diastolic CHF (congestive heart failure) ([place]) 10/13/2016  . Elevated troponin   . Hypovolemic shock ([place]) 04/26/2014  . Upper GI bleed   . Leukocytosis, unspecified 07/30/2013  . Thrombocytosis 07/30/2013  . Leukocytosis 07/30/2013  . Subluxation of left shoulder joint 07/27/2013  . Closed dislocation of left humerus 07/26/2013  . Pyelonephritis 07/25/2013  . Acute on chronic renal failure ([place]) 07/25/2013  . Normocytic anemia 07/25/2013  . Hyponatremia 07/25/2013  . Spastic hemiplegia affecting nondominant side ([place]) 07/06/2013  . 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 Alterations of sensations, late effect of cerebrovascular disease 07/06/2013  . UTI (urinary tract infection) 06/15/2013  . Edema 05/28/2013  . Knee pain 05/28/2013  . Carotid stenosis, bilateral 05/22/2013  . Fall at nursing home 05/20/2013  . Hip pain 05/20/2013  . Pain in joint, lower leg 05/19/2013  . Chest congestion 05/12/2013  . E. coli UTI 05/07/2013  . Left  shoulder pain due to subluxation and/or adhesive capsulitis.  05/07/2013  . Pain in left shoulder 05/07/2013  . Acute CVA (cerebrovascular accident): R PCA infarct per MRI 04/13/13 04/13/2013  . Cerebral infarction ([place]) 04/13/2013  . CVA (cerebral infarction) 04/08/2013  . Acute ischemic stroke ([place]) 04/08/2013  . Diplopia 04/08/2013  . Left-sided weakness 04/08/2013  . Vertigo 04/08/2013  . Paresthesias in left hand 04/08/2013  . Cerebellar stroke ([place]) 04/08/2013  . Dizziness and giddiness 04/08/2013  . Stroke ([place]) 04/08/2013   . Depression 04/06/2013  . Peripheral arterial disease ([place]) 03/16/2013  . History of stroke 01/30/2013  . CKD (chronic kidney disease) stage 3, GFR 30-59 ml/min (HCC) 01/16/2013  . Anxiety state 12/08/2012  . Lumbago 12/08/2012  . Urinary frequency 11/14/2012  . Occlusion and stenosis of carotid artery with cerebral infarction 10/05/2012  . Mixed hyperlipidemia 10/05/2012  . Renal insufficiency 10/05/2012  . Cerebral infarction due to unspecified occlusion or stenosis of unspecified carotid arteries 10/05/2012  . HTN (hypertension) 10/04/2012  . Tobacco abuse 10/04/2012  . Obesity 10/04/2012    Past Surgical History:  Procedure Laterality Date  . A/V FISTULAGRAM N/A 06/06/2019   Procedure: A/V FISTULAGRAM;  Surgeon: Marty Heck, MD;  Location: [place] [facility];  Service: Cardiovascular;  Laterality: N/A;  . AV FISTULA PLACEMENT Left 04/15/2017   Procedure: ARTERIOVENOUS (AV) FISTULA CREATION LEFT ARM;  Surgeon: Serafina Mitchell, MD;  Location: [facility] OR;  Service: Vascular;  Laterality: Left;  . AV FISTULA PLACEMENT Left 05/09/2017   Procedure: INSERTION OF ARTERIOVENOUS (AV) GORE-TEX GRAFT LEFT UPPER ARM;  Surgeon: [name], MD;  Location: [facility] OR;  Service: Vascular;  Laterality: Left;  . AV FISTULA PLACEMENT Right 09/06/2018   Procedure: INSERTION OF ARTERIOVENOUS (AV) GORE-TEX GRAFT RIGHT  ARM;  Surgeon: [name], MD;  Location: [place];  Service: Vascular;  Laterality: Right;  . BASCILIC VEIN TRANSPOSITION Right 08/09/2018   Procedure: BASILIC VEIN TRANSPOSITION 1st Stage;  Surgeon: [name], MD;  Location: [place];  Service: Vascular;  Laterality: Right;  . BIOPSY  01/22/2017   Procedure: BIOPSY;  Surgeon: Danie Binder, MD;  [facility];  Service: Endoscopy;;  gastric  . COMBINED HYSTERECTOMY VAGINAL W/ MMK / A&P REPAIR  1981  . ENDARTERECTOMY Left 10/06/2012   Procedure: Carotid Endarterectomy with Finesse patch angioplasty;  Surgeon: [name], MD;   Location: [place];  Service: Vascular;  Laterality: Left;  . ESOPHAGOGASTRODUODENOSCOPY N/A 04/26/2014   Procedure: ESOPHAGOGASTRODUODENOSCOPY (EGD);  Surgeon: Lear Ng, MD;  Location: [facility] ENDOSCOPY;  Service: Endoscopy;  Laterality: N/A;  . ESOPHAGOGASTRODUODENOSCOPY N/A 01/22/2017   Procedure: ESOPHAGOGASTRODUODENOSCOPY (EGD);  Surgeon: Danie Binder, MD;  [facility];  Service: Endoscopy;  Laterality: N/A;  . FLEXIBLE SIGMOIDOSCOPY N/A 01/22/2017   Procedure: FLEXIBLE SIGMOIDOSCOPY;  Surgeon: Danie Binder, MD;  [facility];  Service: Endoscopy;  Laterality: N/A;  . IR CV LINE INJECTION  02/04/2017  . IR CV LINE INJECTION  04/28/2018  . IR CV LINE INJECTION  05/12/2018  . IR CV LINE INJECTION  05/18/2018  . IR FLUORO GUIDE CV LINE LEFT  01/14/2017  . IR FLUORO GUIDE CV LINE LEFT  04/25/2017  . IR FLUORO GUIDE CV LINE LEFT  05/13/2017  . IR FLUORO GUIDE CV LINE LEFT  03/02/2018  . IR FLUORO GUIDE CV LINE LEFT  04/28/2018  . IR FLUORO GUIDE CV LINE LEFT  05/12/2018  . IR FLUORO GUIDE  CV LINE LEFT  05/18/2018  . IR FLUORO GUIDE CV LINE LEFT  06/23/2018  . IR FLUORO GUIDE CV LINE LEFT  08/02/2018  . IR PTA VENOUS EXCEPT DIALYSIS CIRCUIT  04/28/2018  . IR PTA VENOUS EXCEPT DIALYSIS CIRCUIT  05/12/2018  . IR REMOVAL TUN CV CATH W/O FL  07/08/2017  . IR REMOVAL TUN CV CATH W/O FL  10/11/2018  . IR TRANSCATH RETRIEVAL FB INCL GUIDANCE (MS)  05/12/2018  . IR US GUIDE VASC ACCESS LEFT  01/14/2017  . LOOP RECORDER IMPLANT  04/16/13   MDT LinQ implanted for cryptogenic stroke  . PERIPHERAL VASCULAR INTERVENTION Right 06/06/2019   Procedure: PERIPHERAL VASCULAR INTERVENTION;  Surgeon: Marty Heck, MD;  Location: [place] [facility];  Service: Cardiovascular;  Laterality: Right;  . [name] WITHOUT CARDIOVERSION N/A 04/16/2013   Procedure: TRANSESOPHAGEAL ECHOCARDIOGRAM ([name]);  Surgeon: Josue Hector, MD;  Location: [place];  Service: Cardiovascular;  Laterality: N/A;  .  THROMBECTOMY AND REVISION OF ARTERIOVENTOUS (AV) GORETEX  GRAFT Left 07/24/2017   Procedure: THROMBECTOMY OF LEFT UPPER ARM ARTERIOVENTOUS (AV) GRAFT;  Surgeon: Conrad , MD;  Location: [place];  Service: Vascular;  Laterality: Left;  . URETHRAL DILATION  1980's  . VAGINAL HYSTERECTOMY  1981   "partial" (10/04/2012)     OB History   No obstetric history on file.     Family History  Problem Relation Age of Onset  . Diabetes Mother   . Diabetes Father   . Hypertension Sister   . Diabetes Brother   . Seizures Son   . Kidney disease Maternal Grandmother   . Hypertension Maternal Grandmother   . Heart disease Maternal Grandfather   . Diabetes Paternal Grandmother   . Heart disease Paternal Grandfather     Social History   Tobacco Use  . Smoking status: Former Smoker    Packs/day: 1.00    Years: 44.00    Pack years: 44.00    Types: Cigarettes    Quit date: 10/03/2012    Years since quitting: 7.5  . Smokeless tobacco: Never Used  Vaping Use  . Vaping Use: Never used  Substance Use Topics  . Alcohol use: No    Alcohol/week: 0.0 standard drinks  . Drug use: No    Home Medications Prior to Admission medications   Medication Sig Start Date End Date Taking? Authorizing Provider  acetaminophen (TYLENOL) 500 MG tablet Take 1,000 mg by mouth every 6 (six) hours as needed for moderate pain or headache.   Yes [provider]  amLODipine (NORVASC) 10 MG tablet Take 10 mg by mouth every evening.    Yes [provider]  atorvastatin (LIPITOR) 40 MG tablet Take 40 mg by mouth every evening.  05/17/18  Yes [provider]  bismuth subsalicylate (PEPTO BISMOL) 262 MG/15ML suspension Take 30 mLs by mouth every 6 (six) hours as needed for indigestion.   Yes [provider]  clonazePAM (KLONOPIN) 0.5 MG tablet Take 0.5 mg by mouth daily.   Yes [provider]  clopidogrel (PLAVIX) 75 MG tablet Take 1 tablet (75 mg total) by mouth daily. Patient  taking differently: Take 75 mg by mouth every evening. 01/31/17  Yes Kathie Dike, MD  hydrALAZINE (APRESOLINE) 50 MG tablet Take 1 tablet (50 mg total) by mouth 3 (three) times daily. Patient taking differently: Take 50 mg by mouth 2 (two) times daily. 10/20/16  Yes Cherene Altes, MD  Insulin Glargine (BASAGLAR KWIKPEN) 100 UNIT/ML SOPN Inject 15  Units into the skin daily.   Yes [provider]  metoprolol succinate (TOPROL-XL) 100 MG 24 hr tablet Take 1 tablet (100 mg total) by mouth daily. Patient taking differently: Take 100 mg by mouth every evening. 07/24/17  Yes Conrad Ledbetter, MD  torsemide (DEMADEX) 20 MG tablet Take 40 mg by mouth daily. 08/27/19  Yes [provider]  venlafaxine XR (EFFEXOR-XR) 150 MG 24 hr capsule Take 150 mg by mouth daily.    Yes [provider]  methocarbamol (ROBAXIN) 500 MG tablet Take 1 tablet (500 mg total) by mouth every 6 (six) hours as needed for muscle spasms. Patient not taking: Reported on 04/16/2020 09/13/19   Shelly Coss, MD    Allergies    Hydrochlorothiazide  Review of Systems   Review of Systems  Constitutional: Negative for fever.  HENT: Negative for ear pain.   Eyes: Negative for pain.  Respiratory: Negative for cough.   Cardiovascular: Negative for chest pain.  Gastrointestinal: Negative for abdominal pain.  Genitourinary: Negative for flank pain.  Musculoskeletal: Negative for back pain.  Skin: Negative for rash.  Neurological: Negative for headaches.    Physical Exam Updated Vital Signs BP (!) 137/47   Pulse (!) 56   Temp 98.3 F (36.8 C) (Oral)   Resp 18   Ht 5\' 5"  (1.651 m)   Wt 83 kg   SpO2 98%   BMI 30.45 kg/m   Physical Exam Constitutional:      General: She is not in acute distress.    Appearance: Normal appearance.  HENT:     Head: Normocephalic.     Nose: Nose normal.  Eyes:     Extraocular Movements: Extraocular movements intact.  Cardiovascular:     Rate and Rhythm: Normal  rate.  Pulmonary:     Effort: Pulmonary effort is normal.  Musculoskeletal:        General: Normal range of motion.     Cervical back: Normal range of motion.  Neurological:     General: No focal deficit present.     Mental Status: She is alert. Mental status is at baseline.     Cranial Nerves: No cranial nerve deficit.     Motor: No weakness.     ED Results / Procedures / Treatments   Labs (all labs ordered are listed, but only abnormal results are displayed) Labs Reviewed  RESP PANEL BY RT-PCR (FLU A&B, COVID) ARPGX2 - Abnormal; Notable for the following components:      Result Value   SARS Coronavirus 2 by RT PCR POSITIVE (*)    All other components within normal limits  CBC WITH DIFFERENTIAL/PLATELET - Abnormal; Notable for the following components:   Hemoglobin 11.7 (*)    MCHC 29.8 (*)    RDW 15.9 (*)    All other components within normal limits  BASIC METABOLIC PANEL - Abnormal; Notable for the following components:   Glucose, Bld 142 (*)    Creatinine, Ser 4.32 (*)    Calcium 8.5 (*)    GFR, Estimated 11 (*)    All other components within normal limits  TROPONIN I (HIGH SENSITIVITY) - Abnormal; Notable for the following components:   Troponin I (High Sensitivity) 19 (*)    All other components within normal limits  CULTURE, BLOOD (ROUTINE X 2)  CULTURE, BLOOD (ROUTINE X 2)  URINE CULTURE  URINALYSIS, ROUTINE W REFLEX MICROSCOPIC    EKG EKG Interpretation  Date/Time:  Wednesday April 16 2020 11:21:58 EST Ventricular Rate:  23 PR Interval:    QRS Duration: 97 QT Interval:  446 QTC Calculation: 442 R Axis:   4 Text Interpretation: Sinus or ectopic atrial rhythm Atrial premature complexes in couplets Prolonged PR interval Anterior infarct, old Borderline repolarization abnormality Confirmed by Thamas Jaegers ([id number]) on 04/16/2020 2:00:39 PM   Radiology DG Chest Port 1 View  Result Date: 04/16/2020 CLINICAL DATA:  Fatigue EXAM: PORTABLE CHEST 1 VIEW  COMPARISON:  September 11, 2019 FINDINGS: There is no edema or airspace opacity. Heart is enlarged with pulmonary vascularity within normal limits. There is aortic atherosclerosis. There is a central catheter with tip in the superior vena cava near the cavoatrial junction. There is a loop recorder on the left. IMPRESSION: No edema or airspace opacity. Stable cardiomegaly. Central catheter tip in superior vena cava. No pneumothorax. Aortic Atherosclerosis (ICD10-I70.0). Electronically Signed   By: Lowella Grip III M.D.   On: 04/16/2020 11:58    Procedures Procedures   Medications Ordered in ED Medications - No data to display  ED Course  I have reviewed the triage vital signs and the nursing notes.  Pertinent labs & imaging results that were available during my care of the patient were reviewed by me and considered in my medical decision making (see chart for details).    MDM Rules/Calculators/A&P                          Labs otherwise unremarkable.  Patient unable to provide urine sample at this time.  Vital signs within normal limits O2 saturation normal on room air as well.  [name] diagnostic studies return Covid positive.  Recommending isolation and rest at home.  Advised finger pulse oximeter immediate return if the O2 saturation drops below 90% or if she has worsening symptoms or any additional concerns.   Final Clinical Impression(s) / ED Diagnoses Final diagnoses:  COVID-19 virus infection    Rx / DC Orders ED Discharge Orders    None       Luna Fuse, MD 04/16/20 [phone]

## 2020-04-16 NOTE — ED Triage Notes (Signed)
Pt to er with husband, states that she is a dialysis pt, states that she gets dialysis tues, thurs, Saturday.  States that dialysis yesterday was normal. States that they are here today because she is feeling generally weak, states that she has been weak for the past 1.5 weeks.

## 2020-04-17 DIAGNOSIS — D631 Anemia in chronic kidney disease: Secondary | ICD-10-CM | POA: Diagnosis not present

## 2020-04-17 DIAGNOSIS — I959 Hypotension, unspecified: Secondary | ICD-10-CM | POA: Diagnosis not present

## 2020-04-17 DIAGNOSIS — D509 Iron deficiency anemia, unspecified: Secondary | ICD-10-CM | POA: Diagnosis not present

## 2020-04-17 DIAGNOSIS — Z992 Dependence on renal dialysis: Secondary | ICD-10-CM | POA: Diagnosis not present

## 2020-04-17 DIAGNOSIS — N186 End stage renal disease: Secondary | ICD-10-CM | POA: Diagnosis not present

## 2020-04-17 DIAGNOSIS — N2581 Secondary hyperparathyroidism of renal origin: Secondary | ICD-10-CM | POA: Diagnosis not present

## 2020-04-19 DIAGNOSIS — N2581 Secondary hyperparathyroidism of renal origin: Secondary | ICD-10-CM | POA: Diagnosis not present

## 2020-04-19 DIAGNOSIS — D509 Iron deficiency anemia, unspecified: Secondary | ICD-10-CM | POA: Diagnosis not present

## 2020-04-19 DIAGNOSIS — I959 Hypotension, unspecified: Secondary | ICD-10-CM | POA: Diagnosis not present

## 2020-04-19 DIAGNOSIS — D631 Anemia in chronic kidney disease: Secondary | ICD-10-CM | POA: Diagnosis not present

## 2020-04-19 DIAGNOSIS — N186 End stage renal disease: Secondary | ICD-10-CM | POA: Diagnosis not present

## 2020-04-19 DIAGNOSIS — Z992 Dependence on renal dialysis: Secondary | ICD-10-CM | POA: Diagnosis not present

## 2020-04-21 LAB — CULTURE, BLOOD (ROUTINE X 2)
Culture: NO GROWTH
Culture: NO GROWTH
Special Requests: ADEQUATE
Special Requests: ADEQUATE

## 2020-04-22 DIAGNOSIS — D631 Anemia in chronic kidney disease: Secondary | ICD-10-CM | POA: Diagnosis not present

## 2020-04-22 DIAGNOSIS — N2581 Secondary hyperparathyroidism of renal origin: Secondary | ICD-10-CM | POA: Diagnosis not present

## 2020-04-22 DIAGNOSIS — N186 End stage renal disease: Secondary | ICD-10-CM | POA: Diagnosis not present

## 2020-04-22 DIAGNOSIS — I959 Hypotension, unspecified: Secondary | ICD-10-CM | POA: Diagnosis not present

## 2020-04-22 DIAGNOSIS — D509 Iron deficiency anemia, unspecified: Secondary | ICD-10-CM | POA: Diagnosis not present

## 2020-04-22 DIAGNOSIS — Z992 Dependence on renal dialysis: Secondary | ICD-10-CM | POA: Diagnosis not present

## 2020-04-25 DIAGNOSIS — N2581 Secondary hyperparathyroidism of renal origin: Secondary | ICD-10-CM | POA: Diagnosis not present

## 2020-04-25 DIAGNOSIS — D631 Anemia in chronic kidney disease: Secondary | ICD-10-CM | POA: Diagnosis not present

## 2020-04-25 DIAGNOSIS — D509 Iron deficiency anemia, unspecified: Secondary | ICD-10-CM | POA: Diagnosis not present

## 2020-04-25 DIAGNOSIS — N186 End stage renal disease: Secondary | ICD-10-CM | POA: Diagnosis not present

## 2020-04-25 DIAGNOSIS — Z992 Dependence on renal dialysis: Secondary | ICD-10-CM | POA: Diagnosis not present

## 2020-04-25 DIAGNOSIS — I959 Hypotension, unspecified: Secondary | ICD-10-CM | POA: Diagnosis not present

## 2020-04-26 DIAGNOSIS — D631 Anemia in chronic kidney disease: Secondary | ICD-10-CM | POA: Diagnosis not present

## 2020-04-26 DIAGNOSIS — N186 End stage renal disease: Secondary | ICD-10-CM | POA: Diagnosis not present

## 2020-04-26 DIAGNOSIS — D509 Iron deficiency anemia, unspecified: Secondary | ICD-10-CM | POA: Diagnosis not present

## 2020-04-26 DIAGNOSIS — I959 Hypotension, unspecified: Secondary | ICD-10-CM | POA: Diagnosis not present

## 2020-04-26 DIAGNOSIS — N2581 Secondary hyperparathyroidism of renal origin: Secondary | ICD-10-CM | POA: Diagnosis not present

## 2020-04-26 DIAGNOSIS — Z992 Dependence on renal dialysis: Secondary | ICD-10-CM | POA: Diagnosis not present

## 2020-04-28 DIAGNOSIS — N186 End stage renal disease: Secondary | ICD-10-CM | POA: Diagnosis not present

## 2020-04-28 DIAGNOSIS — Z992 Dependence on renal dialysis: Secondary | ICD-10-CM | POA: Diagnosis not present

## 2020-04-29 DIAGNOSIS — Z992 Dependence on renal dialysis: Secondary | ICD-10-CM | POA: Diagnosis not present

## 2020-04-29 DIAGNOSIS — D631 Anemia in chronic kidney disease: Secondary | ICD-10-CM | POA: Diagnosis not present

## 2020-04-29 DIAGNOSIS — E119 Type 2 diabetes mellitus without complications: Secondary | ICD-10-CM | POA: Diagnosis not present

## 2020-04-29 DIAGNOSIS — N2581 Secondary hyperparathyroidism of renal origin: Secondary | ICD-10-CM | POA: Diagnosis not present

## 2020-04-29 DIAGNOSIS — N186 End stage renal disease: Secondary | ICD-10-CM | POA: Diagnosis not present

## 2020-04-29 DIAGNOSIS — D509 Iron deficiency anemia, unspecified: Secondary | ICD-10-CM | POA: Diagnosis not present

## 2020-05-01 DIAGNOSIS — D509 Iron deficiency anemia, unspecified: Secondary | ICD-10-CM | POA: Diagnosis not present

## 2020-05-01 DIAGNOSIS — Z992 Dependence on renal dialysis: Secondary | ICD-10-CM | POA: Diagnosis not present

## 2020-05-01 DIAGNOSIS — N2581 Secondary hyperparathyroidism of renal origin: Secondary | ICD-10-CM | POA: Diagnosis not present

## 2020-05-01 DIAGNOSIS — N186 End stage renal disease: Secondary | ICD-10-CM | POA: Diagnosis not present

## 2020-05-01 DIAGNOSIS — D631 Anemia in chronic kidney disease: Secondary | ICD-10-CM | POA: Diagnosis not present

## 2020-05-03 DIAGNOSIS — N186 End stage renal disease: Secondary | ICD-10-CM | POA: Diagnosis not present

## 2020-05-03 DIAGNOSIS — Z992 Dependence on renal dialysis: Secondary | ICD-10-CM | POA: Diagnosis not present

## 2020-05-03 DIAGNOSIS — D631 Anemia in chronic kidney disease: Secondary | ICD-10-CM | POA: Diagnosis not present

## 2020-05-03 DIAGNOSIS — D509 Iron deficiency anemia, unspecified: Secondary | ICD-10-CM | POA: Diagnosis not present

## 2020-05-03 DIAGNOSIS — N2581 Secondary hyperparathyroidism of renal origin: Secondary | ICD-10-CM | POA: Diagnosis not present

## 2020-05-06 DIAGNOSIS — D509 Iron deficiency anemia, unspecified: Secondary | ICD-10-CM | POA: Diagnosis not present

## 2020-05-06 DIAGNOSIS — N186 End stage renal disease: Secondary | ICD-10-CM | POA: Diagnosis not present

## 2020-05-06 DIAGNOSIS — Z992 Dependence on renal dialysis: Secondary | ICD-10-CM | POA: Diagnosis not present

## 2020-05-06 DIAGNOSIS — N2581 Secondary hyperparathyroidism of renal origin: Secondary | ICD-10-CM | POA: Diagnosis not present

## 2020-05-06 DIAGNOSIS — D631 Anemia in chronic kidney disease: Secondary | ICD-10-CM | POA: Diagnosis not present

## 2020-05-08 DIAGNOSIS — D631 Anemia in chronic kidney disease: Secondary | ICD-10-CM | POA: Diagnosis not present

## 2020-05-08 DIAGNOSIS — Z992 Dependence on renal dialysis: Secondary | ICD-10-CM | POA: Diagnosis not present

## 2020-05-08 DIAGNOSIS — D509 Iron deficiency anemia, unspecified: Secondary | ICD-10-CM | POA: Diagnosis not present

## 2020-05-08 DIAGNOSIS — N186 End stage renal disease: Secondary | ICD-10-CM | POA: Diagnosis not present

## 2020-05-08 DIAGNOSIS — N2581 Secondary hyperparathyroidism of renal origin: Secondary | ICD-10-CM | POA: Diagnosis not present

## 2020-05-10 DIAGNOSIS — N2581 Secondary hyperparathyroidism of renal origin: Secondary | ICD-10-CM | POA: Diagnosis not present

## 2020-05-10 DIAGNOSIS — Z992 Dependence on renal dialysis: Secondary | ICD-10-CM | POA: Diagnosis not present

## 2020-05-10 DIAGNOSIS — D509 Iron deficiency anemia, unspecified: Secondary | ICD-10-CM | POA: Diagnosis not present

## 2020-05-10 DIAGNOSIS — D631 Anemia in chronic kidney disease: Secondary | ICD-10-CM | POA: Diagnosis not present

## 2020-05-10 DIAGNOSIS — N186 End stage renal disease: Secondary | ICD-10-CM | POA: Diagnosis not present

## 2020-05-12 ENCOUNTER — Ambulatory Visit: Payer: Medicare Other | Admitting: Vascular Surgery

## 2020-05-12 ENCOUNTER — Other Ambulatory Visit: Payer: Medicare Other

## 2020-05-13 DIAGNOSIS — N2581 Secondary hyperparathyroidism of renal origin: Secondary | ICD-10-CM | POA: Diagnosis not present

## 2020-05-13 DIAGNOSIS — D631 Anemia in chronic kidney disease: Secondary | ICD-10-CM | POA: Diagnosis not present

## 2020-05-13 DIAGNOSIS — Z992 Dependence on renal dialysis: Secondary | ICD-10-CM | POA: Diagnosis not present

## 2020-05-13 DIAGNOSIS — N186 End stage renal disease: Secondary | ICD-10-CM | POA: Diagnosis not present

## 2020-05-13 DIAGNOSIS — D509 Iron deficiency anemia, unspecified: Secondary | ICD-10-CM | POA: Diagnosis not present

## 2020-05-15 DIAGNOSIS — N186 End stage renal disease: Secondary | ICD-10-CM | POA: Diagnosis not present

## 2020-05-15 DIAGNOSIS — Z992 Dependence on renal dialysis: Secondary | ICD-10-CM | POA: Diagnosis not present

## 2020-05-15 DIAGNOSIS — N2581 Secondary hyperparathyroidism of renal origin: Secondary | ICD-10-CM | POA: Diagnosis not present

## 2020-05-15 DIAGNOSIS — D631 Anemia in chronic kidney disease: Secondary | ICD-10-CM | POA: Diagnosis not present

## 2020-05-15 DIAGNOSIS — D509 Iron deficiency anemia, unspecified: Secondary | ICD-10-CM | POA: Diagnosis not present

## 2020-05-17 DIAGNOSIS — D631 Anemia in chronic kidney disease: Secondary | ICD-10-CM | POA: Diagnosis not present

## 2020-05-17 DIAGNOSIS — N2581 Secondary hyperparathyroidism of renal origin: Secondary | ICD-10-CM | POA: Diagnosis not present

## 2020-05-17 DIAGNOSIS — Z992 Dependence on renal dialysis: Secondary | ICD-10-CM | POA: Diagnosis not present

## 2020-05-17 DIAGNOSIS — N186 End stage renal disease: Secondary | ICD-10-CM | POA: Diagnosis not present

## 2020-05-17 DIAGNOSIS — D509 Iron deficiency anemia, unspecified: Secondary | ICD-10-CM | POA: Diagnosis not present

## 2020-05-20 DIAGNOSIS — N2581 Secondary hyperparathyroidism of renal origin: Secondary | ICD-10-CM | POA: Diagnosis not present

## 2020-05-20 DIAGNOSIS — Z992 Dependence on renal dialysis: Secondary | ICD-10-CM | POA: Diagnosis not present

## 2020-05-20 DIAGNOSIS — D631 Anemia in chronic kidney disease: Secondary | ICD-10-CM | POA: Diagnosis not present

## 2020-05-20 DIAGNOSIS — D509 Iron deficiency anemia, unspecified: Secondary | ICD-10-CM | POA: Diagnosis not present

## 2020-05-20 DIAGNOSIS — N186 End stage renal disease: Secondary | ICD-10-CM | POA: Diagnosis not present

## 2020-05-22 DIAGNOSIS — D631 Anemia in chronic kidney disease: Secondary | ICD-10-CM | POA: Diagnosis not present

## 2020-05-22 DIAGNOSIS — D509 Iron deficiency anemia, unspecified: Secondary | ICD-10-CM | POA: Diagnosis not present

## 2020-05-22 DIAGNOSIS — N186 End stage renal disease: Secondary | ICD-10-CM | POA: Diagnosis not present

## 2020-05-22 DIAGNOSIS — N2581 Secondary hyperparathyroidism of renal origin: Secondary | ICD-10-CM | POA: Diagnosis not present

## 2020-05-22 DIAGNOSIS — Z992 Dependence on renal dialysis: Secondary | ICD-10-CM | POA: Diagnosis not present

## 2020-05-24 DIAGNOSIS — D631 Anemia in chronic kidney disease: Secondary | ICD-10-CM | POA: Diagnosis not present

## 2020-05-24 DIAGNOSIS — N186 End stage renal disease: Secondary | ICD-10-CM | POA: Diagnosis not present

## 2020-05-24 DIAGNOSIS — N2581 Secondary hyperparathyroidism of renal origin: Secondary | ICD-10-CM | POA: Diagnosis not present

## 2020-05-24 DIAGNOSIS — Z992 Dependence on renal dialysis: Secondary | ICD-10-CM | POA: Diagnosis not present

## 2020-05-24 DIAGNOSIS — D509 Iron deficiency anemia, unspecified: Secondary | ICD-10-CM | POA: Diagnosis not present

## 2020-05-27 DIAGNOSIS — N186 End stage renal disease: Secondary | ICD-10-CM | POA: Diagnosis not present

## 2020-05-27 DIAGNOSIS — D509 Iron deficiency anemia, unspecified: Secondary | ICD-10-CM | POA: Diagnosis not present

## 2020-05-27 DIAGNOSIS — Z992 Dependence on renal dialysis: Secondary | ICD-10-CM | POA: Diagnosis not present

## 2020-05-27 DIAGNOSIS — D631 Anemia in chronic kidney disease: Secondary | ICD-10-CM | POA: Diagnosis not present

## 2020-05-27 DIAGNOSIS — N2581 Secondary hyperparathyroidism of renal origin: Secondary | ICD-10-CM | POA: Diagnosis not present

## 2020-05-29 DIAGNOSIS — Z992 Dependence on renal dialysis: Secondary | ICD-10-CM | POA: Diagnosis not present

## 2020-05-29 DIAGNOSIS — N186 End stage renal disease: Secondary | ICD-10-CM | POA: Diagnosis not present

## 2020-05-29 DIAGNOSIS — N2581 Secondary hyperparathyroidism of renal origin: Secondary | ICD-10-CM | POA: Diagnosis not present

## 2020-05-29 DIAGNOSIS — D509 Iron deficiency anemia, unspecified: Secondary | ICD-10-CM | POA: Diagnosis not present

## 2020-05-29 DIAGNOSIS — D631 Anemia in chronic kidney disease: Secondary | ICD-10-CM | POA: Diagnosis not present

## 2020-05-31 DIAGNOSIS — N186 End stage renal disease: Secondary | ICD-10-CM | POA: Diagnosis not present

## 2020-05-31 DIAGNOSIS — N2581 Secondary hyperparathyroidism of renal origin: Secondary | ICD-10-CM | POA: Diagnosis not present

## 2020-05-31 DIAGNOSIS — E559 Vitamin D deficiency, unspecified: Secondary | ICD-10-CM | POA: Diagnosis not present

## 2020-05-31 DIAGNOSIS — Z992 Dependence on renal dialysis: Secondary | ICD-10-CM | POA: Diagnosis not present

## 2020-05-31 DIAGNOSIS — D509 Iron deficiency anemia, unspecified: Secondary | ICD-10-CM | POA: Diagnosis not present

## 2020-06-03 DIAGNOSIS — N186 End stage renal disease: Secondary | ICD-10-CM | POA: Diagnosis not present

## 2020-06-03 DIAGNOSIS — D509 Iron deficiency anemia, unspecified: Secondary | ICD-10-CM | POA: Diagnosis not present

## 2020-06-03 DIAGNOSIS — Z992 Dependence on renal dialysis: Secondary | ICD-10-CM | POA: Diagnosis not present

## 2020-06-03 DIAGNOSIS — N2581 Secondary hyperparathyroidism of renal origin: Secondary | ICD-10-CM | POA: Diagnosis not present

## 2020-06-03 DIAGNOSIS — E559 Vitamin D deficiency, unspecified: Secondary | ICD-10-CM | POA: Diagnosis not present

## 2020-06-05 DIAGNOSIS — N2581 Secondary hyperparathyroidism of renal origin: Secondary | ICD-10-CM | POA: Diagnosis not present

## 2020-06-05 DIAGNOSIS — E559 Vitamin D deficiency, unspecified: Secondary | ICD-10-CM | POA: Diagnosis not present

## 2020-06-05 DIAGNOSIS — Z992 Dependence on renal dialysis: Secondary | ICD-10-CM | POA: Diagnosis not present

## 2020-06-05 DIAGNOSIS — D509 Iron deficiency anemia, unspecified: Secondary | ICD-10-CM | POA: Diagnosis not present

## 2020-06-05 DIAGNOSIS — N186 End stage renal disease: Secondary | ICD-10-CM | POA: Diagnosis not present

## 2020-06-07 DIAGNOSIS — N2581 Secondary hyperparathyroidism of renal origin: Secondary | ICD-10-CM | POA: Diagnosis not present

## 2020-06-07 DIAGNOSIS — Z992 Dependence on renal dialysis: Secondary | ICD-10-CM | POA: Diagnosis not present

## 2020-06-07 DIAGNOSIS — N186 End stage renal disease: Secondary | ICD-10-CM | POA: Diagnosis not present

## 2020-06-07 DIAGNOSIS — E559 Vitamin D deficiency, unspecified: Secondary | ICD-10-CM | POA: Diagnosis not present

## 2020-06-07 DIAGNOSIS — D509 Iron deficiency anemia, unspecified: Secondary | ICD-10-CM | POA: Diagnosis not present

## 2020-06-10 DIAGNOSIS — N186 End stage renal disease: Secondary | ICD-10-CM | POA: Diagnosis not present

## 2020-06-10 DIAGNOSIS — D509 Iron deficiency anemia, unspecified: Secondary | ICD-10-CM | POA: Diagnosis not present

## 2020-06-10 DIAGNOSIS — Z992 Dependence on renal dialysis: Secondary | ICD-10-CM | POA: Diagnosis not present

## 2020-06-10 DIAGNOSIS — N2581 Secondary hyperparathyroidism of renal origin: Secondary | ICD-10-CM | POA: Diagnosis not present

## 2020-06-10 DIAGNOSIS — E559 Vitamin D deficiency, unspecified: Secondary | ICD-10-CM | POA: Diagnosis not present

## 2020-06-12 DIAGNOSIS — N186 End stage renal disease: Secondary | ICD-10-CM | POA: Diagnosis not present

## 2020-06-12 DIAGNOSIS — Z992 Dependence on renal dialysis: Secondary | ICD-10-CM | POA: Diagnosis not present

## 2020-06-12 DIAGNOSIS — N2581 Secondary hyperparathyroidism of renal origin: Secondary | ICD-10-CM | POA: Diagnosis not present

## 2020-06-12 DIAGNOSIS — D509 Iron deficiency anemia, unspecified: Secondary | ICD-10-CM | POA: Diagnosis not present

## 2020-06-12 DIAGNOSIS — E559 Vitamin D deficiency, unspecified: Secondary | ICD-10-CM | POA: Diagnosis not present

## 2020-06-14 DIAGNOSIS — E559 Vitamin D deficiency, unspecified: Secondary | ICD-10-CM | POA: Diagnosis not present

## 2020-06-14 DIAGNOSIS — N186 End stage renal disease: Secondary | ICD-10-CM | POA: Diagnosis not present

## 2020-06-14 DIAGNOSIS — N2581 Secondary hyperparathyroidism of renal origin: Secondary | ICD-10-CM | POA: Diagnosis not present

## 2020-06-14 DIAGNOSIS — D509 Iron deficiency anemia, unspecified: Secondary | ICD-10-CM | POA: Diagnosis not present

## 2020-06-14 DIAGNOSIS — Z992 Dependence on renal dialysis: Secondary | ICD-10-CM | POA: Diagnosis not present

## 2020-06-17 DIAGNOSIS — E559 Vitamin D deficiency, unspecified: Secondary | ICD-10-CM | POA: Diagnosis not present

## 2020-06-17 DIAGNOSIS — N186 End stage renal disease: Secondary | ICD-10-CM | POA: Diagnosis not present

## 2020-06-17 DIAGNOSIS — N2581 Secondary hyperparathyroidism of renal origin: Secondary | ICD-10-CM | POA: Diagnosis not present

## 2020-06-17 DIAGNOSIS — D509 Iron deficiency anemia, unspecified: Secondary | ICD-10-CM | POA: Diagnosis not present

## 2020-06-17 DIAGNOSIS — Z992 Dependence on renal dialysis: Secondary | ICD-10-CM | POA: Diagnosis not present

## 2020-06-19 DIAGNOSIS — E559 Vitamin D deficiency, unspecified: Secondary | ICD-10-CM | POA: Diagnosis not present

## 2020-06-19 DIAGNOSIS — D509 Iron deficiency anemia, unspecified: Secondary | ICD-10-CM | POA: Diagnosis not present

## 2020-06-19 DIAGNOSIS — Z992 Dependence on renal dialysis: Secondary | ICD-10-CM | POA: Diagnosis not present

## 2020-06-19 DIAGNOSIS — N186 End stage renal disease: Secondary | ICD-10-CM | POA: Diagnosis not present

## 2020-06-19 DIAGNOSIS — N2581 Secondary hyperparathyroidism of renal origin: Secondary | ICD-10-CM | POA: Diagnosis not present

## 2020-06-21 DIAGNOSIS — D509 Iron deficiency anemia, unspecified: Secondary | ICD-10-CM | POA: Diagnosis not present

## 2020-06-21 DIAGNOSIS — N2581 Secondary hyperparathyroidism of renal origin: Secondary | ICD-10-CM | POA: Diagnosis not present

## 2020-06-21 DIAGNOSIS — E559 Vitamin D deficiency, unspecified: Secondary | ICD-10-CM | POA: Diagnosis not present

## 2020-06-21 DIAGNOSIS — N186 End stage renal disease: Secondary | ICD-10-CM | POA: Diagnosis not present

## 2020-06-21 DIAGNOSIS — Z992 Dependence on renal dialysis: Secondary | ICD-10-CM | POA: Diagnosis not present

## 2020-06-24 DIAGNOSIS — Z992 Dependence on renal dialysis: Secondary | ICD-10-CM | POA: Diagnosis not present

## 2020-06-24 DIAGNOSIS — D509 Iron deficiency anemia, unspecified: Secondary | ICD-10-CM | POA: Diagnosis not present

## 2020-06-24 DIAGNOSIS — E559 Vitamin D deficiency, unspecified: Secondary | ICD-10-CM | POA: Diagnosis not present

## 2020-06-24 DIAGNOSIS — N186 End stage renal disease: Secondary | ICD-10-CM | POA: Diagnosis not present

## 2020-06-24 DIAGNOSIS — N2581 Secondary hyperparathyroidism of renal origin: Secondary | ICD-10-CM | POA: Diagnosis not present

## 2020-06-26 DIAGNOSIS — Z992 Dependence on renal dialysis: Secondary | ICD-10-CM | POA: Diagnosis not present

## 2020-06-26 DIAGNOSIS — N2581 Secondary hyperparathyroidism of renal origin: Secondary | ICD-10-CM | POA: Diagnosis not present

## 2020-06-26 DIAGNOSIS — E559 Vitamin D deficiency, unspecified: Secondary | ICD-10-CM | POA: Diagnosis not present

## 2020-06-26 DIAGNOSIS — N186 End stage renal disease: Secondary | ICD-10-CM | POA: Diagnosis not present

## 2020-06-26 DIAGNOSIS — D509 Iron deficiency anemia, unspecified: Secondary | ICD-10-CM | POA: Diagnosis not present

## 2020-06-28 DIAGNOSIS — N2581 Secondary hyperparathyroidism of renal origin: Secondary | ICD-10-CM | POA: Diagnosis not present

## 2020-06-28 DIAGNOSIS — Z992 Dependence on renal dialysis: Secondary | ICD-10-CM | POA: Diagnosis not present

## 2020-06-28 DIAGNOSIS — N186 End stage renal disease: Secondary | ICD-10-CM | POA: Diagnosis not present

## 2020-06-28 DIAGNOSIS — D509 Iron deficiency anemia, unspecified: Secondary | ICD-10-CM | POA: Diagnosis not present

## 2020-06-28 DIAGNOSIS — E559 Vitamin D deficiency, unspecified: Secondary | ICD-10-CM | POA: Diagnosis not present

## 2020-07-01 DIAGNOSIS — Z992 Dependence on renal dialysis: Secondary | ICD-10-CM | POA: Diagnosis not present

## 2020-07-01 DIAGNOSIS — D631 Anemia in chronic kidney disease: Secondary | ICD-10-CM | POA: Diagnosis not present

## 2020-07-01 DIAGNOSIS — D509 Iron deficiency anemia, unspecified: Secondary | ICD-10-CM | POA: Diagnosis not present

## 2020-07-01 DIAGNOSIS — N2581 Secondary hyperparathyroidism of renal origin: Secondary | ICD-10-CM | POA: Diagnosis not present

## 2020-07-01 DIAGNOSIS — N186 End stage renal disease: Secondary | ICD-10-CM | POA: Diagnosis not present

## 2020-07-02 ENCOUNTER — Encounter (HOSPITAL_COMMUNITY): Payer: Self-pay

## 2020-07-02 ENCOUNTER — Inpatient Hospital Stay (HOSPITAL_COMMUNITY)
Admission: EM | Admit: 2020-07-02 | Discharge: 2020-07-05 | DRG: 673 | Disposition: A | Discharge status: Home Health | Payer: Medicare Other | Attending: Internal Medicine | Admitting: Internal Medicine

## 2020-07-02 ENCOUNTER — Other Ambulatory Visit: Payer: Self-pay

## 2020-07-02 ENCOUNTER — Emergency Department (HOSPITAL_COMMUNITY): Payer: Medicare Other

## 2020-07-02 ENCOUNTER — Ambulatory Visit: Payer: Medicare Other | Admitting: Vascular Surgery

## 2020-07-02 DIAGNOSIS — Z8711 Personal history of peptic ulcer disease: Secondary | ICD-10-CM | POA: Diagnosis not present

## 2020-07-02 DIAGNOSIS — Z66 Do not resuscitate: Secondary | ICD-10-CM | POA: Diagnosis present

## 2020-07-02 DIAGNOSIS — I5033 Acute on chronic diastolic (congestive) heart failure: Secondary | ICD-10-CM | POA: Diagnosis present

## 2020-07-02 DIAGNOSIS — Z833 Family history of diabetes mellitus: Secondary | ICD-10-CM

## 2020-07-02 DIAGNOSIS — Z888 Allergy status to other drugs, medicaments and biological substances status: Secondary | ICD-10-CM

## 2020-07-02 DIAGNOSIS — Z794 Long term (current) use of insulin: Secondary | ICD-10-CM | POA: Diagnosis not present

## 2020-07-02 DIAGNOSIS — I120 Hypertensive chronic kidney disease with stage 5 chronic kidney disease or end stage renal disease: Secondary | ICD-10-CM | POA: Diagnosis not present

## 2020-07-02 DIAGNOSIS — Y828 Other medical devices associated with adverse incidents: Secondary | ICD-10-CM | POA: Diagnosis present

## 2020-07-02 DIAGNOSIS — R5381 Other malaise: Secondary | ICD-10-CM | POA: Diagnosis present

## 2020-07-02 DIAGNOSIS — G9341 Metabolic encephalopathy: Secondary | ICD-10-CM | POA: Diagnosis present

## 2020-07-02 DIAGNOSIS — M1990 Unspecified osteoarthritis, unspecified site: Secondary | ICD-10-CM | POA: Diagnosis not present

## 2020-07-02 DIAGNOSIS — I5032 Chronic diastolic (congestive) heart failure: Secondary | ICD-10-CM | POA: Diagnosis present

## 2020-07-02 DIAGNOSIS — F32A Depression, unspecified: Secondary | ICD-10-CM | POA: Diagnosis present

## 2020-07-02 DIAGNOSIS — E782 Mixed hyperlipidemia: Secondary | ICD-10-CM | POA: Diagnosis not present

## 2020-07-02 DIAGNOSIS — F419 Anxiety disorder, unspecified: Secondary | ICD-10-CM | POA: Diagnosis not present

## 2020-07-02 DIAGNOSIS — Z79899 Other long term (current) drug therapy: Secondary | ICD-10-CM

## 2020-07-02 DIAGNOSIS — E1169 Type 2 diabetes mellitus with other specified complication: Secondary | ICD-10-CM | POA: Diagnosis present

## 2020-07-02 DIAGNOSIS — E1122 Type 2 diabetes mellitus with diabetic chronic kidney disease: Secondary | ICD-10-CM | POA: Diagnosis present

## 2020-07-02 DIAGNOSIS — Y712 Prosthetic and other implants, materials and accessory cardiovascular devices associated with adverse incidents: Secondary | ICD-10-CM | POA: Diagnosis not present

## 2020-07-02 DIAGNOSIS — T8241XA Breakdown (mechanical) of vascular dialysis catheter, initial encounter: Secondary | ICD-10-CM | POA: Diagnosis not present

## 2020-07-02 DIAGNOSIS — N186 End stage renal disease: Secondary | ICD-10-CM | POA: Diagnosis not present

## 2020-07-02 DIAGNOSIS — D631 Anemia in chronic kidney disease: Secondary | ICD-10-CM | POA: Diagnosis not present

## 2020-07-02 DIAGNOSIS — Z8673 Personal history of transient ischemic attack (TIA), and cerebral infarction without residual deficits: Secondary | ICD-10-CM

## 2020-07-02 DIAGNOSIS — F411 Generalized anxiety disorder: Secondary | ICD-10-CM | POA: Diagnosis not present

## 2020-07-02 DIAGNOSIS — Z20822 Contact with and (suspected) exposure to covid-19: Secondary | ICD-10-CM | POA: Diagnosis not present

## 2020-07-02 DIAGNOSIS — M797 Fibromyalgia: Secondary | ICD-10-CM | POA: Diagnosis not present

## 2020-07-02 DIAGNOSIS — T829XXA Unspecified complication of cardiac and vascular prosthetic device, implant and graft, initial encounter: Secondary | ICD-10-CM

## 2020-07-02 DIAGNOSIS — R059 Cough, unspecified: Secondary | ICD-10-CM | POA: Diagnosis not present

## 2020-07-02 DIAGNOSIS — R531 Weakness: Secondary | ICD-10-CM | POA: Diagnosis present

## 2020-07-02 DIAGNOSIS — T82898A Other specified complication of vascular prosthetic devices, implants and grafts, initial encounter: Secondary | ICD-10-CM | POA: Diagnosis not present

## 2020-07-02 DIAGNOSIS — Z992 Dependence on renal dialysis: Secondary | ICD-10-CM

## 2020-07-02 DIAGNOSIS — I132 Hypertensive heart and chronic kidney disease with heart failure and with stage 5 chronic kidney disease, or end stage renal disease: Secondary | ICD-10-CM | POA: Diagnosis present

## 2020-07-02 DIAGNOSIS — R9431 Abnormal electrocardiogram [ECG] [EKG]: Secondary | ICD-10-CM | POA: Diagnosis not present

## 2020-07-02 DIAGNOSIS — Z9889 Other specified postprocedural states: Secondary | ICD-10-CM

## 2020-07-02 DIAGNOSIS — Z7902 Long term (current) use of antithrombotics/antiplatelets: Secondary | ICD-10-CM

## 2020-07-02 DIAGNOSIS — Z8249 Family history of ischemic heart disease and other diseases of the circulatory system: Secondary | ICD-10-CM

## 2020-07-02 DIAGNOSIS — I517 Cardiomegaly: Secondary | ICD-10-CM | POA: Diagnosis not present

## 2020-07-02 DIAGNOSIS — T8249XA Other complication of vascular dialysis catheter, initial encounter: Secondary | ICD-10-CM | POA: Diagnosis not present

## 2020-07-02 DIAGNOSIS — J90 Pleural effusion, not elsewhere classified: Secondary | ICD-10-CM | POA: Diagnosis not present

## 2020-07-02 DIAGNOSIS — I10 Essential (primary) hypertension: Secondary | ICD-10-CM | POA: Diagnosis present

## 2020-07-02 DIAGNOSIS — Z419 Encounter for procedure for purposes other than remedying health state, unspecified: Secondary | ICD-10-CM

## 2020-07-02 DIAGNOSIS — Z87891 Personal history of nicotine dependence: Secondary | ICD-10-CM

## 2020-07-02 HISTORY — DX: Headache, unspecified: R51.9

## 2020-07-02 HISTORY — DX: Personal history of urinary calculi: Z87.442

## 2020-07-02 HISTORY — DX: Unspecified osteoarthritis, unspecified site: M19.90

## 2020-07-02 HISTORY — DX: Angina pectoris, unspecified: I20.9

## 2020-07-02 HISTORY — DX: Cardiac arrhythmia, unspecified: I49.9

## 2020-07-02 LAB — RESP PANEL BY RT-PCR (FLU A&B, COVID) ARPGX2
Influenza A by PCR: NEGATIVE
Influenza B by PCR: NEGATIVE
SARS Coronavirus 2 by RT PCR: NEGATIVE

## 2020-07-02 LAB — CBC WITH DIFFERENTIAL/PLATELET
Abs Immature Granulocytes: 0.16 10*3/uL — ABNORMAL HIGH (ref 0.00–0.07)
Basophils Absolute: 0 10*3/uL (ref 0.0–0.1)
Basophils Relative: 0 %
Eosinophils Absolute: 0.3 10*3/uL (ref 0.0–0.5)
Eosinophils Relative: 2 %
HCT: 29.7 % — ABNORMAL LOW (ref 36.0–46.0)
Hemoglobin: 9.1 g/dL — ABNORMAL LOW (ref 12.0–15.0)
Immature Granulocytes: 2 %
Lymphocytes Relative: 20 %
Lymphs Abs: 2 10*3/uL (ref 0.7–4.0)
MCH: 28.9 pg (ref 26.0–34.0)
MCHC: 30.6 g/dL (ref 30.0–36.0)
MCV: 94.3 fL (ref 80.0–100.0)
Monocytes Absolute: 0.9 10*3/uL (ref 0.1–1.0)
Monocytes Relative: 9 %
Neutro Abs: 6.9 10*3/uL (ref 1.7–7.7)
Neutrophils Relative %: 67 %
Platelets: 258 10*3/uL (ref 150–400)
RBC: 3.15 MIL/uL — ABNORMAL LOW (ref 3.87–5.11)
RDW: 14.9 % (ref 11.5–15.5)
WBC: 10.3 10*3/uL (ref 4.0–10.5)
nRBC: 0 % (ref 0.0–0.2)

## 2020-07-02 LAB — BASIC METABOLIC PANEL
Anion gap: 14 (ref 5–15)
BUN: 44 mg/dL — ABNORMAL HIGH (ref 8–23)
CO2: 21 mmol/L — ABNORMAL LOW (ref 22–32)
Calcium: 8.6 mg/dL — ABNORMAL LOW (ref 8.9–10.3)
Chloride: 101 mmol/L (ref 98–111)
Creatinine, Ser: 6.19 mg/dL — ABNORMAL HIGH (ref 0.44–1.00)
GFR, Estimated: 7 mL/min — ABNORMAL LOW (ref >60)
Glucose, Bld: 102 mg/dL — ABNORMAL HIGH (ref 70–99)
Potassium: 4.3 mmol/L (ref 3.5–5.1)
Sodium: 136 mmol/L (ref 135–145)

## 2020-07-02 LAB — POC SARS CORONAVIRUS 2 AG -  ED: SARSCOV2ONAVIRUS 2 AG: NEGATIVE

## 2020-07-02 NOTE — Consult Note (Signed)
V[facility] OF [place]  ASSESSMENT / PLAN: 70 y.o. female with ESRD dialyzing TTS via RIJ TDC. The patient is not clear on whether the catheter is totally non-functional or if it is poorly functional. She also has a small area of ulceration with purulent drainage over a thrombosed RUE AVG. Plan OR tomorrow for [facility] replacement and partial RUE AVG excision. NPO after midnight.  CHIEF COMPLAINT: missed appointment  HISTORY OF PRESENT ILLNESS: Kelli [name] is a 70 y.o. female with ESRD on HD TTS via RIJ TDC. She has thrombosed bilateral upper extremity arteriovenous grafts. We last saw the patient April 2021 for a right upper extremity fistulagram. She has been getting care a [facility] since that time. The graft has since thrombosed and she has become catheter dependent. The patient is a poor historian and cannot give much detail. She went to dialysis yesterday and stayed for a "typical session." She cannot tell me whether they were able to successfully complete HD. Her husband is not sure as he was not with her. She had an appointment to see my partner Dr. Scot Dock earlier today, but cancelled this because of diarrhea.   Past Medical History:  Diagnosis Date  . Anemia   . Anxiety   . Carotid artery occlusion    Occluded RICA, status post left CEA  August 2014 - Dr. Donnetta Hutching  . Cerebral infarction [facility]) Aug 2014   Bihemispheric watershed infarcts  . 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 CHF (congestive heart failure) ([place])   . CKD (chronic kidney disease) stage 3, GFR 30-59 ml/min (HCC)    dialysis [place]  . Closed dislocation of left humerus 07/26/2013  . Depression   . DM (diabetes mellitus), type 2 ([place])   . Dyspnea    with exertion  . Essential hypertension, benign   . Fibromyalgia   . Mixed hyperlipidemia   . Multiple gastric ulcers   . Pneumonia   . Stroke [facility])    pt states she cannot walk, left arm weakness  . Urinary  incontinence     Past Surgical History:  Procedure Laterality Date  . A/V FISTULAGRAM N/A 06/06/2019   Procedure: A/V FISTULAGRAM;  Surgeon: Marty Heck, MD;  Location: [place] CV LAB;  Service: Cardiovascular;  Laterality: N/A;  . AV FISTULA PLACEMENT Left 04/15/2017   Procedure: ARTERIOVENOUS (AV) FISTULA CREATION LEFT ARM;  Surgeon: Serafina Mitchell, MD;  Location: [facility] OR;  Service: Vascular;  Laterality: Left;  . AV FISTULA PLACEMENT Left 05/09/2017   Procedure: INSERTION OF ARTERIOVENOUS (AV) GORE-TEX GRAFT LEFT UPPER ARM;  Surgeon: [name], MD;  Location: [facility] OR;  Service: Vascular;  Laterality: Left;  . AV FISTULA PLACEMENT Right 09/06/2018   Procedure: INSERTION OF ARTERIOVENOUS (AV) GORE-TEX GRAFT RIGHT  ARM;  Surgeon: [name], MD;  Location: [place];  Service: Vascular;  Laterality: Right;  . BASCILIC VEIN TRANSPOSITION Right 08/09/2018   Procedure: BASILIC VEIN TRANSPOSITION 1st Stage;  Surgeon: [name], MD;  Location: [place];  Service: Vascular;  Laterality: Right;  . BIOPSY  01/22/2017   Procedure: BIOPSY;  Surgeon: Danie Binder, MD;  [facility];  Service: Endoscopy;;  gastric  . COMBINED HYSTERECTOMY VAGINAL W/ MMK / A&P REPAIR  1981  . ENDARTERECTOMY Left 10/06/2012   Procedure: Carotid Endarterectomy with Finesse patch angioplasty;  Surgeon: [name], MD;  Location: [place];  Service: Vascular;  Laterality: Left;  .  ESOPHAGOGASTRODUODENOSCOPY N/A 04/26/2014   Procedure: ESOPHAGOGASTRODUODENOSCOPY (EGD);  Surgeon: Lear Ng, MD;  Location: [facility] ENDOSCOPY;  Service: Endoscopy;  Laterality: N/A;  . ESOPHAGOGASTRODUODENOSCOPY N/A 01/22/2017   Procedure: ESOPHAGOGASTRODUODENOSCOPY (EGD);  Surgeon: Danie Binder, MD;  [facility];  Service: Endoscopy;  Laterality: N/A;  . FLEXIBLE SIGMOIDOSCOPY N/A 01/22/2017   Procedure: FLEXIBLE SIGMOIDOSCOPY;  Surgeon: Danie Binder, MD;  [facility];  Service: Endoscopy;   Laterality: N/A;  . IR CV LINE INJECTION  02/04/2017  . IR CV LINE INJECTION  04/28/2018  . IR CV LINE INJECTION  05/12/2018  . IR CV LINE INJECTION  05/18/2018  . IR FLUORO GUIDE CV LINE LEFT  01/14/2017  . IR FLUORO GUIDE CV LINE LEFT  04/25/2017  . IR FLUORO GUIDE CV LINE LEFT  05/13/2017  . IR FLUORO GUIDE CV LINE LEFT  03/02/2018  . IR FLUORO GUIDE CV LINE LEFT  04/28/2018  . IR FLUORO GUIDE CV LINE LEFT  05/12/2018  . IR FLUORO GUIDE CV LINE LEFT  05/18/2018  . IR FLUORO GUIDE CV LINE LEFT  06/23/2018  . IR FLUORO GUIDE CV LINE LEFT  08/02/2018  . IR PTA VENOUS EXCEPT DIALYSIS CIRCUIT  04/28/2018  . IR PTA VENOUS EXCEPT DIALYSIS CIRCUIT  05/12/2018  . IR REMOVAL TUN CV CATH W/O FL  07/08/2017  . IR REMOVAL TUN CV CATH W/O FL  10/11/2018  . IR TRANSCATH RETRIEVAL FB INCL GUIDANCE (MS)  05/12/2018  . IR US GUIDE VASC ACCESS LEFT  01/14/2017  . LOOP RECORDER IMPLANT  04/16/13   MDT LinQ implanted for cryptogenic stroke  . PERIPHERAL VASCULAR INTERVENTION Right 06/06/2019   Procedure: PERIPHERAL VASCULAR INTERVENTION;  Surgeon: Marty Heck, MD;  Location: [place] CV LAB;  Service: Cardiovascular;  Laterality: Right;  . [name] WITHOUT CARDIOVERSION N/A 04/16/2013   Procedure: TRANSESOPHAGEAL ECHOCARDIOGRAM ([name]);  Surgeon: Josue Hector, MD;  Location: [place];  Service: Cardiovascular;  Laterality: N/A;  . THROMBECTOMY AND REVISION OF ARTERIOVENTOUS (AV) GORETEX  GRAFT Left 07/24/2017   Procedure: THROMBECTOMY OF LEFT UPPER ARM ARTERIOVENTOUS (AV) GRAFT;  Surgeon: Conrad Wildwood, MD;  Location: [place];  Service: Vascular;  Laterality: Left;  . URETHRAL DILATION  1980's  . VAGINAL HYSTERECTOMY  1981   "partial" (10/04/2012)    Family History  Problem Relation Age of Onset  . Diabetes Mother   . Diabetes Father   . Hypertension Sister   . Diabetes Brother   . Seizures Son   . Kidney disease Maternal Grandmother   . Hypertension Maternal Grandmother   . Heart disease Maternal Grandfather    . Diabetes Paternal Grandmother   . Heart disease Paternal Grandfather     Social History   Socioeconomic History  . Marital status: Married    Spouse name: Not on file  . Number of children: Not on file  . Years of education: Not on file  . Highest education level: Not on file  Occupational History  . Not on file  Tobacco Use  . Smoking status: Former Smoker    Packs/day: 1.00    Years: 44.00    Pack years: 44.00    Types: Cigarettes    Quit date: 10/03/2012    Years since quitting: 7.7  . Smokeless tobacco: Never Used  Vaping Use  . Vaping Use: Never used  Substance and Sexual Activity  . Alcohol use: No    Alcohol/week: 0.0 standard drinks  . Drug use: No  .  Sexual activity: Yes    Birth control/protection: Surgical  Other Topics Concern  . Not on file  Social History Narrative   INITIALLY WORKED [employer]. 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   Social Determinants of Health   Financial Resource Strain: Not on file  Food Insecurity: Not on file  Transportation Needs: Not on file  Physical Activity: Not on file  Stress: Not on file  Social Connections: Not on file  Intimate Partner Violence: Not on file    Allergies  Allergen Reactions  . Hydrochlorothiazide Nausea And Vomiting    No current facility-administered medications for this encounter.   Current Outpatient Medications  Medication Sig Dispense Refill  . acetaminophen (TYLENOL) 500 MG tablet Take 1,000 mg by mouth every 6 (six) hours as needed for moderate pain or headache.    Marland Kitchen amLODipine (NORVASC) 10 MG tablet Take 10 mg by mouth every evening.     Marland Kitchen atorvastatin (LIPITOR) 40 MG tablet Take 40 mg by mouth every evening.     . bismuth subsalicylate (PEPTO BISMOL) 262 MG/15ML suspension Take 30 mLs by mouth every 6 (six) hours as needed for indigestion.    . clonazePAM (KLONOPIN) 0.5 MG tablet Take 0.5 mg by mouth daily.    . clopidogrel (PLAVIX) 75 MG  tablet Take 1 tablet (75 mg total) by mouth daily. (Patient taking differently: Take 75 mg by mouth every evening.)    . hydrALAZINE (APRESOLINE) 50 MG tablet Take 1 tablet (50 mg total) by mouth 3 (three) times daily. (Patient taking differently: Take 50 mg by mouth 2 (two) times daily.) 90 tablet 0  . Insulin Glargine (BASAGLAR KWIKPEN) 100 UNIT/ML SOPN Inject 15 Units into the skin daily.    Marland Kitchen loperamide (IMODIUM) 2 MG capsule Take 2 mg by mouth as needed for diarrhea or loose stools.    . metoprolol succinate (TOPROL-XL) 100 MG 24 hr tablet Take 1 tablet (100 mg total) by mouth daily. (Patient taking differently: Take 100 mg by mouth every evening.) 30 tablet 0  . torsemide (DEMADEX) 20 MG tablet Take 40 mg by mouth daily.    Marland Kitchen venlafaxine XR (EFFEXOR-XR) 150 MG 24 hr capsule Take 150 mg by mouth daily.     . methocarbamol (ROBAXIN) 500 MG tablet Take 1 tablet (500 mg total) by mouth every 6 (six) hours as needed for muscle spasms. (Patient not taking: Reported on 04/16/2020) 30 tablet 0   Facility-Administered Medications Ordered in Other Encounters  Medication Dose Route Frequency Provider Last Rate Last Admin  . 0.9 %  sodium chloride infusion   Intravenous Continuous Monia Sabal, PA-C   New Bag at 09/06/18 1206    REVIEW OF SYSTEMS:  [X]  denotes positive finding, [ ]  denotes negative finding Cardiac  Comments:  Chest pain or chest pressure:    Shortness of breath upon exertion:    Short of breath when lying flat:    Irregular heart rhythm:        Vascular    Pain in calf, thigh, or hip brought on by ambulation:    Pain in feet at night that wakes you up from your sleep:     Blood clot in your veins:    Leg swelling:         Pulmonary    Oxygen at home:    Productive cough:     Wheezing:         Neurologic    Sudden weakness in  arms or legs:     Sudden numbness in arms or legs:     Sudden onset of difficulty speaking or slurred speech:    Temporary loss of vision in one  eye:     Problems with dizziness:         Gastrointestinal    Blood in stool:     Vomited blood:         Genitourinary    Burning when urinating:     Blood in urine:        Psychiatric    Major depression:         Hematologic    Bleeding problems:    Problems with blood clotting too easily:        Skin    Rashes or ulcers:        Constitutional    Fever or chills:      PHYSICAL EXAM  Vitals:   07/02/20 1235 07/02/20 1236 07/02/20 1532 07/02/20 1839  BP: (!) 90/49  114/76 (!) 181/63  Pulse: 76  80 87  Resp: 20  (!) 21 15  Temp: 98.3 F (36.8 C)   98.3 F (36.8 C)  TempSrc: Oral   Oral  SpO2: 93%  90% 96%  Weight:  90.7 kg    Height:  5\' 5"  (1.651 m)      Constitutional: chronically ill appearing. In no distress. Appears well nourished.  Neurologic: CN intact. Left arm weakness. Psychiatric: Mood and affect symmetric and appropriate. Eyes: No icterus. No conjunctival pallor. Ears, nose, throat: mucous membranes moist. Midline trachea.  Cardiac: regular rate and rhythm.  Respiratory: unlabored. Abdominal: soft, non-tender, non-distended.  Peripheral vascular:  Thrombosed bilateral upper extremity arteriovenous grafts  RIJ TDC in place    Extremity: No edema. No cyanosis. No pallor.  Skin: No gangrene. No ulceration.  Lymphatic: No Stemmer's sign. No palpable lymphadenopathy.  PERTINENT LABORATORY AND RADIOLOGIC DATA  Most recent CBC CBC Latest Ref Rng & Units 04/16/2020 09/13/2019 09/12/2019  WBC 4.0 - 10.5 K/uL 6.6 11.1(H) 14.4(H)  Hemoglobin 12.0 - 15.0 g/dL 11.7(L) 9.0(L) 9.9(L)  Hematocrit 36.0 - 46.0 % 39.3 28.7(L) 31.6(L)  Platelets 150 - 400 K/uL 227 306 297     Most recent CMP CMP Latest Ref Rng & Units 04/16/2020 09/13/2019 09/12/2019  Glucose 70 - 99 mg/dL 142(H) 152(H) 260(H)  BUN 8 - 23 mg/dL 19 41(H) 27(H)  Creatinine 0.44 - 1.00 mg/dL 4.32(H) 6.16(H) 4.47(H)  Sodium 135 - 145 mmol/L 137 139 137  Potassium 3.5 - 5.1 mmol/L 3.5 3.5 3.6   Chloride 98 - 111 mmol/L 102 100 96(L)  CO2 22 - 32 mmol/L 26 19(L) 24  Calcium 8.9 - 10.3 mg/dL 8.5(L) 6.7(L) 6.9(L)  Total Protein 6.5 - 8.1 g/dL - - -  Total Bilirubin 0.3 - 1.2 mg/dL - - -  Alkaline Phos 38 - 126 U/L - - -  AST 15 - 41 U/L - - -  ALT 14 - 54 U/L - - -    Renal function CrCl cannot be calculated ([name] most recent lab result is older than the maximum 21 days allowed.).  Hgb A1c MFr Bld (%)  Date Value  09/12/2019 7.7 (H)    LDL Cholesterol  Date Value Ref Range Status  09/12/2019 36 0 - 99 mg/dL Final    Comment:           Total Cholesterol/HDL:CHD Risk Coronary Heart Disease Risk Table  Men   Women  1/2 Average Risk   3.4   3.3  Average Risk       5.0   4.4  2 X Average Risk   9.6   7.1  3 X Average Risk  23.4   11.0        Use the calculated Patient Ratio above and the CHD Risk Table to determine the patient's CHD Risk.        ATP III CLASSIFICATION (LDL):  <100     mg/dL   Optimal  100-129  mg/dL   Near or Above                    Optimal  130-159  mg/dL   Borderline  160-189  mg/dL   High  >190     mg/dL   Very High Performed at [facility] [address], [place], [place] [address]     Yevonne Aline. Stanford Breed, MD [facility] Phone Number: [phone] 07/02/2020 9:20 PM

## 2020-07-02 NOTE — ED Triage Notes (Signed)
Pt to er, husband with pt, states that they are here because she is having problems with her dialysis catheter, states that they had an appointment with a vascular doc in [place], but it got canceled and they were told to come to the er instead.  States that she had dialysis yesterday and is scheduled again tomorrow.

## 2020-07-02 NOTE — H&P (View-Only) (Signed)
VASCULAR AND VEIN SPECIALISTS OF [place]  ASSESSMENT / PLAN: 70 y.o. female with ESRD dialyzing TTS via RIJ TDC. The patient is not clear on whether the catheter is totally non-functional or if it is poorly functional. She also has a small area of ulceration with purulent drainage over a thrombosed RUE AVG. Plan OR tomorrow for [facility] replacement and partial RUE AVG excision. NPO after midnight.  CHIEF COMPLAINT: missed appointment  HISTORY OF PRESENT ILLNESS: Kelli [name] is a 70 y.o. female with ESRD on HD TTS via RIJ TDC. She has thrombosed bilateral upper extremity arteriovenous grafts. We last saw the patient April 2021 for a right upper extremity fistulagram. She has been getting care a [facility] since that time. The graft has since thrombosed and she has become catheter dependent. The patient is a poor historian and cannot give much detail. She went to dialysis yesterday and stayed for a "typical session." She cannot tell me whether they were able to successfully complete HD. Her husband is not sure as he was not with her. She had an appointment to see my partner Dr. Scot Dock earlier today, but cancelled this because of diarrhea.   Past Medical History:  Diagnosis Date  . Anemia   . Anxiety   . Carotid artery occlusion    Occluded RICA, status post left CEA  August 2014 - Dr. Donnetta Hutching  . Cerebral infarction [facility]) Aug 2014   Bihemispheric watershed infarcts  . Cerebral infarction involving left cerebellar artery [facility]) Feb 2015  . CHF (congestive heart failure) ([place])   . CKD (chronic kidney disease) stage 3, GFR 30-59 ml/min (HCC)    dialysis T/Th/sa  . Closed dislocation of left humerus 07/26/2013  . Depression   . DM (diabetes mellitus), type 2 ([place])   . Dyspnea    with exertion  . Essential hypertension, benign   . Fibromyalgia   . Mixed hyperlipidemia   . Multiple gastric ulcers   . Pneumonia   . Stroke [facility])    pt states she cannot walk, left arm weakness  . Urinary  incontinence     Past Surgical History:  Procedure Laterality Date  . A/V FISTULAGRAM N/A 06/06/2019   Procedure: A/V FISTULAGRAM;  Surgeon: Marty Heck, MD;  Location: [place] [facility];  Service: Cardiovascular;  Laterality: N/A;  . AV FISTULA PLACEMENT Left 04/15/2017   Procedure: ARTERIOVENOUS (AV) FISTULA CREATION LEFT ARM;  Surgeon: Serafina Mitchell, MD;  Location: [facility] OR;  Service: Vascular;  Laterality: Left;  . AV FISTULA PLACEMENT Left 05/09/2017   Procedure: INSERTION OF ARTERIOVENOUS (AV) GORE-TEX GRAFT LEFT UPPER ARM;  Surgeon: [name], MD;  Location: [facility] OR;  Service: Vascular;  Laterality: Left;  . AV FISTULA PLACEMENT Right 09/06/2018   Procedure: INSERTION OF ARTERIOVENOUS (AV) GORE-TEX GRAFT RIGHT  ARM;  Surgeon: [name], MD;  Location: [place];  Service: Vascular;  Laterality: Right;  . BASCILIC VEIN TRANSPOSITION Right 08/09/2018   Procedure: BASILIC VEIN TRANSPOSITION 1st Stage;  Surgeon: [name], MD;  Location: [place];  Service: Vascular;  Laterality: Right;  . BIOPSY  01/22/2017   Procedure: BIOPSY;  Surgeon: Danie Binder, MD;  Location: [facility];  Service: Endoscopy;;  gastric  . COMBINED HYSTERECTOMY VAGINAL W/ MMK / A&P REPAIR  1981  . ENDARTERECTOMY Left 10/06/2012   Procedure: Carotid Endarterectomy with Finesse patch angioplasty;  Surgeon: [name], MD;  Location: [place];  Service: Vascular;  Laterality: Left;  .  ESOPHAGOGASTRODUODENOSCOPY N/A 04/26/2014   Procedure: ESOPHAGOGASTRODUODENOSCOPY (EGD);  Surgeon: Lear Ng, MD;  Location: [facility] ENDOSCOPY;  Service: Endoscopy;  Laterality: N/A;  . ESOPHAGOGASTRODUODENOSCOPY N/A 01/22/2017   Procedure: ESOPHAGOGASTRODUODENOSCOPY (EGD);  Surgeon: Danie Binder, MD;  [facility];  Service: Endoscopy;  Laterality: N/A;  . FLEXIBLE SIGMOIDOSCOPY N/A 01/22/2017   Procedure: FLEXIBLE SIGMOIDOSCOPY;  Surgeon: Danie Binder, MD;  [facility];  Service: Endoscopy;   Laterality: N/A;  . IR CV LINE INJECTION  02/04/2017  . IR CV LINE INJECTION  04/28/2018  . IR CV LINE INJECTION  05/12/2018  . IR CV LINE INJECTION  05/18/2018  . IR FLUORO GUIDE CV LINE LEFT  01/14/2017  . IR FLUORO GUIDE CV LINE LEFT  04/25/2017  . IR FLUORO GUIDE CV LINE LEFT  05/13/2017  . IR FLUORO GUIDE CV LINE LEFT  03/02/2018  . IR FLUORO GUIDE CV LINE LEFT  04/28/2018  . IR FLUORO GUIDE CV LINE LEFT  05/12/2018  . IR FLUORO GUIDE CV LINE LEFT  05/18/2018  . IR FLUORO GUIDE CV LINE LEFT  06/23/2018  . IR FLUORO GUIDE CV LINE LEFT  08/02/2018  . IR PTA VENOUS EXCEPT DIALYSIS CIRCUIT  04/28/2018  . IR PTA VENOUS EXCEPT DIALYSIS CIRCUIT  05/12/2018  . IR REMOVAL TUN CV CATH W/O FL  07/08/2017  . IR REMOVAL TUN CV CATH W/O FL  10/11/2018  . IR TRANSCATH RETRIEVAL FB INCL GUIDANCE (MS)  05/12/2018  . IR US GUIDE VASC ACCESS LEFT  01/14/2017  . LOOP RECORDER IMPLANT  04/16/13   MDT LinQ implanted for cryptogenic stroke  . PERIPHERAL VASCULAR INTERVENTION Right 06/06/2019   Procedure: PERIPHERAL VASCULAR INTERVENTION;  Surgeon: Marty Heck, MD;  Location: [place] [facility];  Service: Cardiovascular;  Laterality: Right;  . [name] WITHOUT CARDIOVERSION N/A 04/16/2013   Procedure: TRANSESOPHAGEAL ECHOCARDIOGRAM ([name]);  Surgeon: Josue Hector, MD;  Location: [place];  Service: Cardiovascular;  Laterality: N/A;  . THROMBECTOMY AND REVISION OF ARTERIOVENTOUS (AV) GORETEX  GRAFT Left 07/24/2017   Procedure: THROMBECTOMY OF LEFT UPPER ARM ARTERIOVENTOUS (AV) GRAFT;  Surgeon: Conrad Collingswood, MD;  Location: [place];  Service: Vascular;  Laterality: Left;  . URETHRAL DILATION  1980's  . VAGINAL HYSTERECTOMY  1981   "partial" (10/04/2012)    Family History  Problem Relation Age of Onset  . Diabetes Mother   . Diabetes Father   . Hypertension Sister   . Diabetes Brother   . Seizures Son   . Kidney disease Maternal Grandmother   . Hypertension Maternal Grandmother   . Heart disease Maternal Grandfather    . Diabetes Paternal Grandmother   . Heart disease Paternal Grandfather     Social History   Socioeconomic History  . Marital status: Married    Spouse name: Not on file  . Number of children: Not on file  . Years of education: Not on file  . Highest education level: Not on file  Occupational History  . Not on file  Tobacco Use  . Smoking status: Former Smoker    Packs/day: 1.00    Years: 44.00    Pack years: 44.00    Types: Cigarettes    Quit date: 10/03/2012    Years since quitting: 7.7  . Smokeless tobacco: Never Used  Vaping Use  . Vaping Use: Never used  Substance and Sexual Activity  . Alcohol use: No    Alcohol/week: 0.0 standard drinks  . Drug use: No  .  Sexual activity: Yes    Birth control/protection: Surgical  Other Topics Concern  . Not on file  Social History Narrative   INITIALLY WORKED [employer]. 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   Social Determinants of Health   Financial Resource Strain: Not on file  Food Insecurity: Not on file  Transportation Needs: Not on file  Physical Activity: Not on file  Stress: Not on file  Social Connections: Not on file  Intimate Partner Violence: Not on file    Allergies  Allergen Reactions  . Hydrochlorothiazide Nausea And Vomiting    No current facility-administered medications for this encounter.   Current Outpatient Medications  Medication Sig Dispense Refill  . acetaminophen (TYLENOL) 500 MG tablet Take 1,000 mg by mouth every 6 (six) hours as needed for moderate pain or headache.    Marland Kitchen amLODipine (NORVASC) 10 MG tablet Take 10 mg by mouth every evening.     Marland Kitchen atorvastatin (LIPITOR) 40 MG tablet Take 40 mg by mouth every evening.     . bismuth subsalicylate (PEPTO BISMOL) 262 MG/15ML suspension Take 30 mLs by mouth every 6 (six) hours as needed for indigestion.    . clonazePAM (KLONOPIN) 0.5 MG tablet Take 0.5 mg by mouth daily.    . clopidogrel (PLAVIX) 75 MG  tablet Take 1 tablet (75 mg total) by mouth daily. (Patient taking differently: Take 75 mg by mouth every evening.)    . hydrALAZINE (APRESOLINE) 50 MG tablet Take 1 tablet (50 mg total) by mouth 3 (three) times daily. (Patient taking differently: Take 50 mg by mouth 2 (two) times daily.) 90 tablet 0  . Insulin Glargine (BASAGLAR KWIKPEN) 100 UNIT/ML SOPN Inject 15 Units into the skin daily.    Marland Kitchen loperamide (IMODIUM) 2 MG capsule Take 2 mg by mouth as needed for diarrhea or loose stools.    . metoprolol succinate (TOPROL-XL) 100 MG 24 hr tablet Take 1 tablet (100 mg total) by mouth daily. (Patient taking differently: Take 100 mg by mouth every evening.) 30 tablet 0  . torsemide (DEMADEX) 20 MG tablet Take 40 mg by mouth daily.    Marland Kitchen venlafaxine XR (EFFEXOR-XR) 150 MG 24 hr capsule Take 150 mg by mouth daily.     . methocarbamol (ROBAXIN) 500 MG tablet Take 1 tablet (500 mg total) by mouth every 6 (six) hours as needed for muscle spasms. (Patient not taking: Reported on 04/16/2020) 30 tablet 0   Facility-Administered Medications Ordered in Other Encounters  Medication Dose Route Frequency Provider Last Rate Last Admin  . 0.9 %  sodium chloride infusion   Intravenous Continuous Monia Sabal, PA-C   New Bag at 09/06/18 1206    REVIEW OF SYSTEMS:  [X]  denotes positive finding, [ ]  denotes negative finding Cardiac  Comments:  Chest pain or chest pressure:    Shortness of breath upon exertion:    Short of breath when lying flat:    Irregular heart rhythm:        Vascular    Pain in calf, thigh, or hip brought on by ambulation:    Pain in feet at night that wakes you up from your sleep:     Blood clot in your veins:    Leg swelling:         Pulmonary    Oxygen at home:    Productive cough:     Wheezing:         Neurologic    Sudden weakness in  arms or legs:     Sudden numbness in arms or legs:     Sudden onset of difficulty speaking or slurred speech:    Temporary loss of vision in one  eye:     Problems with dizziness:         Gastrointestinal    Blood in stool:     Vomited blood:         Genitourinary    Burning when urinating:     Blood in urine:        Psychiatric    Major depression:         Hematologic    Bleeding problems:    Problems with blood clotting too easily:        Skin    Rashes or ulcers:        Constitutional    Fever or chills:      PHYSICAL EXAM  Vitals:   07/02/20 1235 07/02/20 1236 07/02/20 1532 07/02/20 1839  BP: (!) 90/49  114/76 (!) 181/63  Pulse: 76  80 87  Resp: 20  (!) 21 15  Temp: 98.3 F (36.8 C)   98.3 F (36.8 C)  TempSrc: Oral   Oral  SpO2: 93%  90% 96%  Weight:  90.7 kg    Height:  5\' 5"  (1.651 m)      Constitutional: chronically ill appearing. In no distress. Appears well nourished.  Neurologic: CN intact. Left arm weakness. Psychiatric: Mood and affect symmetric and appropriate. Eyes: No icterus. No conjunctival pallor. Ears, nose, throat: mucous membranes moist. Midline trachea.  Cardiac: regular rate and rhythm.  Respiratory: unlabored. Abdominal: soft, non-tender, non-distended.  Peripheral vascular:  Thrombosed bilateral upper extremity arteriovenous grafts  RIJ TDC in place    Extremity: No edema. No cyanosis. No pallor.  Skin: No gangrene. No ulceration.  Lymphatic: No Stemmer's sign. No palpable lymphadenopathy.  PERTINENT LABORATORY AND RADIOLOGIC DATA  Most recent CBC CBC Latest Ref Rng & Units 04/16/2020 09/13/2019 09/12/2019  WBC 4.0 - 10.5 K/uL 6.6 11.1(H) 14.4(H)  Hemoglobin 12.0 - 15.0 g/dL 11.7(L) 9.0(L) 9.9(L)  Hematocrit 36.0 - 46.0 % 39.3 28.7(L) 31.6(L)  Platelets 150 - 400 K/uL 227 306 297     Most recent CMP CMP Latest Ref Rng & Units 04/16/2020 09/13/2019 09/12/2019  Glucose 70 - 99 mg/dL 142(H) 152(H) 260(H)  BUN 8 - 23 mg/dL 19 41(H) 27(H)  Creatinine 0.44 - 1.00 mg/dL 4.32(H) 6.16(H) 4.47(H)  Sodium 135 - 145 mmol/L 137 139 137  Potassium 3.5 - 5.1 mmol/L 3.5 3.5 3.6   Chloride 98 - 111 mmol/L 102 100 96(L)  CO2 22 - 32 mmol/L 26 19(L) 24  Calcium 8.9 - 10.3 mg/dL 8.5(L) 6.7(L) 6.9(L)  Total Protein 6.5 - 8.1 g/dL - - -  Total Bilirubin 0.3 - 1.2 mg/dL - - -  Alkaline Phos 38 - 126 U/L - - -  AST 15 - 41 U/L - - -  ALT 14 - 54 U/L - - -    Renal function CrCl cannot be calculated ([name] most recent lab result is older than the maximum 21 days allowed.).  Hgb A1c MFr Bld (%)  Date Value  09/12/2019 7.7 (H)    LDL Cholesterol  Date Value Ref Range Status  09/12/2019 36 0 - 99 mg/dL Final    Comment:           Total Cholesterol/HDL:CHD Risk Coronary Heart Disease Risk Table  Men   Women  1/2 Average Risk   3.4   3.3  Average Risk       5.0   4.4  2 X Average Risk   9.6   7.1  3 X Average Risk  23.4   11.0        Use the calculated Patient Ratio above and the CHD Risk Table to determine the patient's CHD Risk.        ATP III CLASSIFICATION (LDL):  <100     mg/dL   Optimal  100-129  mg/dL   Near or Above                    Optimal  130-159  mg/dL   Borderline  160-189  mg/dL   High  >190     mg/dL   Very High Performed at [facility] [address], [place], [place] [address]     Yevonne Aline. Stanford Breed, MD [facility]ascular [facility]in Specialists of [facility] Phone Number: [phone] 07/02/2020 9:20 PM

## 2020-07-02 NOTE — ED Notes (Signed)
Pt is a transfer from [facility] due to HD cath to right chest malfunction. Pt reports getting dialysis TTHS. Unable to finish dialysis yesterday due to malfunction. Denies pain. Pt is alert and oriented x 4. Pending consult to vascular surgery.

## 2020-07-02 NOTE — ED Provider Notes (Signed)
Pt transferred from [facility] for eval of her tunneled dialysis catheter.  Pt has noticed some inflammation and pus from the catheter site.  She was unable to finish dialysis yesterday due to dysfunction of the catheter.  Pt d/w Dr. Stanford Breed (vascular).  He requests medicine admission and he will replace the catheter tomorrow.  Pt d/w Dr. Cyd Silence (triad) for admission.    Isla Pence, MD 07/02/20 2251

## 2020-07-02 NOTE — ED Provider Notes (Addendum)
[facility] EMERGENCY DEPARTMENT Provider Note   CSN: [id number] Arrival date & time: 07/02/20  1215     History Chief Complaint  Patient presents with  . Vascular Access Problem    Kelli [name] is a 70 y.o. female.  HPI   Patient has a history of chronic renal failure.  She is on dialysis.  Patient went to dialysis yesterday.  According to family members she was having issues with her dialysis catheter.  They scheduled an appointment with the vascular surgeon earlier today.  When the patient woke up she was not feeling very well.  She had been having episodes of diarrhea.  She cancelled the appointment.  When they notified the nephrology center they told them not to cancel.  The husband tried to call back and was told they could not reschedule.  They should go to the ED.  The family decided to come to [facility] ED.    She denies any fever.  No vomiting.  No shortness of breath.  Patient states she does have a slight cough but that is chronic  Past Medical History:  Diagnosis Date  . Anemia   . Anxiety   . Carotid artery occlusion    Occluded RICA, status post left CEA  August 2014 - Dr. Donnetta Hutching  . Cerebral infarction [facility]) Aug 2014   Bihemispheric watershed infarcts  . Cerebral infarction involving left cerebellar artery [facility]) Feb 2015  . CHF (congestive heart failure) ([place])   . CKD (chronic kidney disease) stage 3, GFR 30-59 ml/min (HCC)    dialysis [place]  . Closed dislocation of left humerus 07/26/2013  . Depression   . DM (diabetes mellitus), type 2 ([place])   . Dyspnea    with exertion  . Essential hypertension, benign   . Fibromyalgia   . Mixed hyperlipidemia   . Multiple gastric ulcers   . Pneumonia   . Stroke [facility])    pt states she cannot walk, left arm weakness  . Urinary incontinence     Patient Active Problem List   Diagnosis Date Noted  . Ischemic chest pain ([place]) 09/11/2019  . Chest pain 09/11/2019  . Occult GI bleeding 01/28/2017  . TIA (transient  ischemic attack) 01/28/2017  . Congestive heart failure ([place])   . ESRD on hemodialysis ([place])   . Pressure injury of skin 01/23/2017  . ESRD (end stage renal disease) ([place])   . Gastritis and duodenitis   . Acute metabolic encephalopathy 68/01/7516  . Palliative care encounter   . Goals of care, counseling/discussion   . DNR (do not resuscitate) discussion   . Acute renal failure superimposed on stage 4 chronic kidney disease ([place]) 01/09/2017  . Precordial pain   . CKD (chronic kidney disease), stage IV ([place]) 01/07/2017  . Acute respiratory failure with hypoxia ([place]) 01/07/2017  . Anemia 01/06/2017  . Diabetes mellitus type 2 in obese ([place]) 01/06/2017  . B12 deficiency 11/18/2016  . Anemia in chronic kidney disease 11/18/2016  . PUD (peptic ulcer disease) 10/13/2016  . Pacemaker 10/13/2016  . Acute on chronic diastolic CHF (congestive heart failure) ([place]) 10/13/2016  . Elevated troponin   . Hypovolemic shock ([place]) 04/26/2014  . Upper GI bleed   . Leukocytosis, unspecified 07/30/2013  . Thrombocytosis 07/30/2013  . Leukocytosis 07/30/2013  . Subluxation of left shoulder joint 07/27/2013  . Closed dislocation of left humerus 07/26/2013  . Pyelonephritis 07/25/2013  . Acute on chronic renal failure ([place]) 07/25/2013  . Normocytic anemia 07/25/2013  .  Hyponatremia 07/25/2013  . Spastic hemiplegia affecting nondominant side ([place]) 07/06/2013  . 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 Alterations of sensations, late effect of cerebrovascular disease 07/06/2013  . UTI (urinary tract infection) 06/15/2013  . Edema 05/28/2013  . Knee pain 05/28/2013  . Carotid stenosis, bilateral 05/22/2013  . Fall at nursing home 05/20/2013  . Hip pain 05/20/2013  . Pain in joint, lower leg 05/19/2013  . Chest congestion 05/12/2013  . E. coli UTI 05/07/2013  . Left shoulder pain due to subluxation and/or adhesive capsulitis.  05/07/2013  . Pain in left  shoulder 05/07/2013  . Acute CVA (cerebrovascular accident): R PCA infarct per MRI 04/13/13 04/13/2013  . Cerebral infarction ([place]) 04/13/2013  . CVA (cerebral infarction) 04/08/2013  . Acute ischemic stroke ([place]) 04/08/2013  . Diplopia 04/08/2013  . Left-sided weakness 04/08/2013  . Vertigo 04/08/2013  . Paresthesias in left hand 04/08/2013  . Cerebellar stroke ([place]) 04/08/2013  . Dizziness and giddiness 04/08/2013  . Stroke ([place]) 04/08/2013  . Depression 04/06/2013  . Peripheral arterial disease ([place]) 03/16/2013  . History of stroke 01/30/2013  . CKD (chronic kidney disease) stage 3, GFR 30-59 ml/min (HCC) 01/16/2013  . Anxiety state 12/08/2012  . Lumbago 12/08/2012  . Urinary frequency 11/14/2012  . Occlusion and stenosis of carotid artery with cerebral infarction 10/05/2012  . Mixed hyperlipidemia 10/05/2012  . Renal insufficiency 10/05/2012  . Cerebral infarction due to unspecified occlusion or stenosis of unspecified carotid arteries 10/05/2012  . HTN (hypertension) 10/04/2012  . Tobacco abuse 10/04/2012  . Obesity 10/04/2012    Past Surgical History:  Procedure Laterality Date  . A/V FISTULAGRAM N/A 06/06/2019   Procedure: A/V FISTULAGRAM;  Surgeon: Marty Heck, MD;  Location: [place] [facility];  Service: Cardiovascular;  Laterality: N/A;  . AV FISTULA PLACEMENT Left 04/15/2017   Procedure: ARTERIOVENOUS (AV) FISTULA CREATION LEFT ARM;  Surgeon: Serafina Mitchell, MD;  Location: [facility] OR;  Service: Vascular;  Laterality: Left;  . AV FISTULA PLACEMENT Left 05/09/2017   Procedure: INSERTION OF ARTERIOVENOUS (AV) GORE-TEX GRAFT LEFT UPPER ARM;  Surgeon: [name], MD;  Location: [facility] OR;  Service: Vascular;  Laterality: Left;  . AV FISTULA PLACEMENT Right 09/06/2018   Procedure: INSERTION OF ARTERIOVENOUS (AV) GORE-TEX GRAFT RIGHT  ARM;  Surgeon: [name], MD;  Location: [place];  Service: Vascular;  Laterality: Right;  . BASCILIC VEIN TRANSPOSITION Right 08/09/2018    Procedure: BASILIC VEIN TRANSPOSITION 1st Stage;  Surgeon: [name], MD;  Location: [place];  Service: Vascular;  Laterality: Right;  . BIOPSY  01/22/2017   Procedure: BIOPSY;  Surgeon: Danie Binder, MD;  [facility];  Service: Endoscopy;;  gastric  . COMBINED HYSTERECTOMY VAGINAL W/ MMK / A&P REPAIR  1981  . ENDARTERECTOMY Left 10/06/2012   Procedure: Carotid Endarterectomy with Finesse patch angioplasty;  Surgeon: [name], MD;  Location: [place];  Service: Vascular;  Laterality: Left;  . ESOPHAGOGASTRODUODENOSCOPY N/A 04/26/2014   Procedure: ESOPHAGOGASTRODUODENOSCOPY (EGD);  Surgeon: Lear Ng, MD;  Location: [facility] ENDOSCOPY;  Service: Endoscopy;  Laterality: N/A;  . ESOPHAGOGASTRODUODENOSCOPY N/A 01/22/2017   Procedure: ESOPHAGOGASTRODUODENOSCOPY (EGD);  Surgeon: Danie Binder, MD;  [facility];  Service: Endoscopy;  Laterality: N/A;  . FLEXIBLE SIGMOIDOSCOPY N/A 01/22/2017   Procedure: FLEXIBLE SIGMOIDOSCOPY;  Surgeon: Danie Binder, MD;  [facility];  Service: Endoscopy;  Laterality: N/A;  . IR CV LINE INJECTION  02/04/2017  .  IR CV LINE INJECTION  04/28/2018  . IR CV LINE INJECTION  05/12/2018  . IR CV LINE INJECTION  05/18/2018  . IR FLUORO GUIDE CV LINE LEFT  01/14/2017  . IR FLUORO GUIDE CV LINE LEFT  04/25/2017  . IR FLUORO GUIDE CV LINE LEFT  05/13/2017  . IR FLUORO GUIDE CV LINE LEFT  03/02/2018  . IR FLUORO GUIDE CV LINE LEFT  04/28/2018  . IR FLUORO GUIDE CV LINE LEFT  05/12/2018  . IR FLUORO GUIDE CV LINE LEFT  05/18/2018  . IR FLUORO GUIDE CV LINE LEFT  06/23/2018  . IR FLUORO GUIDE CV LINE LEFT  08/02/2018  . IR PTA VENOUS EXCEPT DIALYSIS CIRCUIT  04/28/2018  . IR PTA VENOUS EXCEPT DIALYSIS CIRCUIT  05/12/2018  . IR REMOVAL TUN CV CATH W/O FL  07/08/2017  . IR REMOVAL TUN CV CATH W/O FL  10/11/2018  . IR TRANSCATH RETRIEVAL FB INCL GUIDANCE (MS)  05/12/2018  . IR US GUIDE VASC ACCESS LEFT  01/14/2017  . LOOP RECORDER IMPLANT  04/16/13    MDT LinQ implanted for cryptogenic stroke  . PERIPHERAL VASCULAR INTERVENTION Right 06/06/2019   Procedure: PERIPHERAL VASCULAR INTERVENTION;  Surgeon: Marty Heck, MD;  Location: [place] CV LAB;  Service: Cardiovascular;  Laterality: Right;  . [name] WITHOUT CARDIOVERSION N/A 04/16/2013   Procedure: TRANSESOPHAGEAL ECHOCARDIOGRAM ([name]);  Surgeon: Josue Hector, MD;  Location: [place];  Service: Cardiovascular;  Laterality: N/A;  . THROMBECTOMY AND REVISION OF ARTERIOVENTOUS (AV) GORETEX  GRAFT Left 07/24/2017   Procedure: THROMBECTOMY OF LEFT UPPER ARM ARTERIOVENTOUS (AV) GRAFT;  Surgeon: Conrad Highfill, MD;  Location: [place];  Service: Vascular;  Laterality: Left;  . URETHRAL DILATION  1980's  . VAGINAL HYSTERECTOMY  1981   "partial" (10/04/2012)     OB History   No obstetric history on file.     Family History  Problem Relation Age of Onset  . Diabetes Mother   . Diabetes Father   . Hypertension Sister   . Diabetes Brother   . Seizures Son   . Kidney disease Maternal Grandmother   . Hypertension Maternal Grandmother   . Heart disease Maternal Grandfather   . Diabetes Paternal Grandmother   . Heart disease Paternal Grandfather     Social History   Tobacco Use  . Smoking status: Former Smoker    Packs/day: 1.00    Years: 44.00    Pack years: 44.00    Types: Cigarettes    Quit date: 10/03/2012    Years since quitting: 7.7  . Smokeless tobacco: Never Used  Vaping Use  . Vaping Use: Never used  Substance Use Topics  . Alcohol use: No    Alcohol/week: 0.0 standard drinks  . Drug use: No    Home Medications Prior to Admission medications   Medication Sig Start Date End Date Taking? Authorizing Provider  acetaminophen (TYLENOL) 500 MG tablet Take 1,000 mg by mouth every 6 (six) hours as needed for moderate pain or headache.   Yes [provider]  amLODipine (NORVASC) 10 MG tablet Take 10 mg by mouth every evening.    Yes [provider]   atorvastatin (LIPITOR) 40 MG tablet Take 40 mg by mouth every evening.  05/17/18  Yes [provider]  bismuth subsalicylate (PEPTO BISMOL) 262 MG/15ML suspension Take 30 mLs by mouth every 6 (six) hours as needed for indigestion.   Yes [provider]  clonazePAM (KLONOPIN) 0.5 MG tablet Take 0.5 mg  by mouth daily.   Yes [provider]  clopidogrel (PLAVIX) 75 MG tablet Take 1 tablet (75 mg total) by mouth daily. Patient taking differently: Take 75 mg by mouth every evening. 01/31/17  Yes Kathie Dike, MD  hydrALAZINE (APRESOLINE) 50 MG tablet Take 1 tablet (50 mg total) by mouth 3 (three) times daily. Patient taking differently: Take 50 mg by mouth 2 (two) times daily. 10/20/16  Yes Cherene Altes, MD  Insulin Glargine (BASAGLAR KWIKPEN) 100 UNIT/ML SOPN Inject 15 Units into the skin daily.   Yes [provider]  loperamide (IMODIUM) 2 MG capsule Take 2 mg by mouth as needed for diarrhea or loose stools.   Yes [provider]  metoprolol succinate (TOPROL-XL) 100 MG 24 hr tablet Take 1 tablet (100 mg total) by mouth daily. Patient taking differently: Take 100 mg by mouth every evening. 07/24/17  Yes Conrad Sharp, MD  torsemide (DEMADEX) 20 MG tablet Take 40 mg by mouth daily. 08/27/19  Yes [provider]  venlafaxine XR (EFFEXOR-XR) 150 MG 24 hr capsule Take 150 mg by mouth daily.    Yes [provider]  methocarbamol (ROBAXIN) 500 MG tablet Take 1 tablet (500 mg total) by mouth every 6 (six) hours as needed for muscle spasms. Patient not taking: Reported on 04/16/2020 09/13/19   Shelly Coss, MD    Allergies    Hydrochlorothiazide  Review of Systems   Review of Systems  All other systems reviewed and are negative.   Physical Exam Updated Vital Signs BP 114/76 (BP Location: Left Arm)   Pulse 80   Temp 98.3 F (36.8 C) (Oral)   Resp (!) 21   Ht 1.651 m ($Remove'5\' 5"'[id number]$ )   Wt 90.7 kg   SpO2 90%   BMI 33.28 kg/m    Physical Exam Vitals and nursing note reviewed.  Constitutional:      General: She is not in acute distress.    Appearance: She is well-developed.  HENT:     Head: Normocephalic and atraumatic.     Right Ear: External ear normal.     Left Ear: External ear normal.  Eyes:     General: No scleral icterus.       Right eye: No discharge.        Left eye: No discharge.     Conjunctiva/sclera: Conjunctivae normal.  Neck:     Trachea: No tracheal deviation.  Cardiovascular:     Rate and Rhythm: Normal rate and regular rhythm.  Pulmonary:     Effort: Pulmonary effort is normal. No respiratory distress.     Breath sounds: Normal breath sounds. No stridor. No wheezing or rales.  Abdominal:     General: Bowel sounds are normal. There is no distension.     Palpations: Abdomen is soft.     Tenderness: There is no abdominal tenderness. There is no guarding or rebound.  Musculoskeletal:        General: No tenderness.     Cervical back: Neck supple.     Comments: Right AC fossa, granulomatous raised lesion overlying AV fistula, mild erythema , white drainage around the lesion  Skin:    General: Skin is warm and dry.     Findings: No rash.  Neurological:     Mental Status: She is alert.     Cranial Nerves: No cranial nerve deficit (no facial droop, extraocular movements intact, no slurred speech).     Sensory: No sensory deficit.     Motor:  No abnormal muscle tone or seizure activity.     Coordination: Coordination normal.     ED Results / Procedures / Treatments   Labs (all labs ordered are listed, but only abnormal results are displayed) Labs Reviewed  RESP PANEL BY RT-PCR (FLU A&B, COVID) [id number]  POC SARS CORONAVIRUS 2 AG -  ED    EKG None  Radiology DG Chest Portable 1 View  Result Date: 07/02/2020 CLINICAL DATA:  Cough EXAM: PORTABLE CHEST 1 VIEW COMPARISON:  04/16/2020 FINDINGS: Right-sided central venous catheter tip at the cavoatrial region. Electronic recording  device over left chest. Cardiomegaly with probable small left effusion. Mild central congestion. Aortic atherosclerosis. No pneumothorax. Upper extremity vascular stents. IMPRESSION: 1. Right-sided central venous catheter tip over the cavoatrial region 2. Cardiomegaly with slight central congestion and probable small left effusion Electronically Signed   By: Donavan Foil M.D.   On: 07/02/2020 16:40    Procedures Procedures   Medications Ordered in ED Medications - No data to display  ED Course  I have reviewed the triage vital signs and the nursing notes.  Pertinent labs & imaging results that were available during my care of the patient were reviewed by me and considered in my medical decision making (see chart for details).  Clinical Course as of 07/02/20 1754  Wed Jul 02, 2020  1537 Discussed case with OR nurse who spoke with Dr Stanford Breed.  Pt will need to be transferred to [facility].  He can see her there  [UJ]  8119 Discussed with Dr Joya Gaskins notifying her of ED to ED transfer to see Dr Stanford Breed. [JY]  7829 Labs not completed by time of transfer to [place] [JK]    Clinical Course User Index [JK] Dorie Rank, MD   MDM Rules/Calculators/A&P                          Patient presented to the ED to see a vascular surgeon.  Unclear if the patient is having issues specifically with her tunneled dialysis catheter not functioning properly versus a possible infection overlying an old AV fistula site in her right antecubital fossa.  Patient is afebrile.  She was supposed to see the vascular surgeon today.  Family brought her to this emergency department but there is no vascular surgery available here.  I did discuss the case with Dr. Standley Dakins who states he certainly can see the patient in the emergency room at [facility].  Patient does not appear to be having acute issues at this time otherwise.  Initial blood pressure was low but repeat is normal.  In case she does need to go to the OR we will add  on a CBC and be met and chest x-ray.  We will arrange for transfer to [facility] emergency room because the husband states normally they have to call for vehicle transport.  He is not able to get her into the vehicle on his own. Final Clinical Impression(s) / ED Diagnoses Final diagnoses:  Complication of vascular access for dialysis, initial encounter      Dorie Rank, MD 07/02/20 1553    Dorie Rank, MD 07/02/20 1755

## 2020-07-02 NOTE — ED Notes (Signed)
Pt need new Access to right upper chest. Pt unable to move left arm paralaysis, Dialysis shunt on right arm non-accessible. Pt uses a hoyer lift at dialysis.

## 2020-07-03 ENCOUNTER — Encounter (HOSPITAL_COMMUNITY): Payer: Self-pay | Admitting: Internal Medicine

## 2020-07-03 ENCOUNTER — Observation Stay (HOSPITAL_COMMUNITY): Payer: Medicare Other

## 2020-07-03 ENCOUNTER — Encounter (HOSPITAL_COMMUNITY)
Admission: EM | Disposition: A | Discharge status: Home Health | Payer: Self-pay | Source: Home / Self Care | Attending: Internal Medicine

## 2020-07-03 ENCOUNTER — Observation Stay (HOSPITAL_COMMUNITY): Payer: Medicare Other | Admitting: Anesthesiology

## 2020-07-03 DIAGNOSIS — D631 Anemia in chronic kidney disease: Secondary | ICD-10-CM | POA: Diagnosis present

## 2020-07-03 DIAGNOSIS — Z8673 Personal history of transient ischemic attack (TIA), and cerebral infarction without residual deficits: Secondary | ICD-10-CM | POA: Diagnosis not present

## 2020-07-03 DIAGNOSIS — Z794 Long term (current) use of insulin: Secondary | ICD-10-CM | POA: Diagnosis not present

## 2020-07-03 DIAGNOSIS — I5033 Acute on chronic diastolic (congestive) heart failure: Secondary | ICD-10-CM | POA: Diagnosis not present

## 2020-07-03 DIAGNOSIS — G9341 Metabolic encephalopathy: Secondary | ICD-10-CM | POA: Diagnosis not present

## 2020-07-03 DIAGNOSIS — Z8711 Personal history of peptic ulcer disease: Secondary | ICD-10-CM | POA: Diagnosis not present

## 2020-07-03 DIAGNOSIS — R531 Weakness: Secondary | ICD-10-CM | POA: Diagnosis present

## 2020-07-03 DIAGNOSIS — N186 End stage renal disease: Secondary | ICD-10-CM

## 2020-07-03 DIAGNOSIS — M1990 Unspecified osteoarthritis, unspecified site: Secondary | ICD-10-CM | POA: Diagnosis present

## 2020-07-03 DIAGNOSIS — I517 Cardiomegaly: Secondary | ICD-10-CM | POA: Diagnosis not present

## 2020-07-03 DIAGNOSIS — E1122 Type 2 diabetes mellitus with diabetic chronic kidney disease: Secondary | ICD-10-CM | POA: Diagnosis not present

## 2020-07-03 DIAGNOSIS — R5381 Other malaise: Secondary | ICD-10-CM | POA: Diagnosis present

## 2020-07-03 DIAGNOSIS — Z833 Family history of diabetes mellitus: Secondary | ICD-10-CM | POA: Diagnosis not present

## 2020-07-03 DIAGNOSIS — E782 Mixed hyperlipidemia: Secondary | ICD-10-CM

## 2020-07-03 DIAGNOSIS — R4182 Altered mental status, unspecified: Secondary | ICD-10-CM | POA: Diagnosis not present

## 2020-07-03 DIAGNOSIS — I5032 Chronic diastolic (congestive) heart failure: Secondary | ICD-10-CM

## 2020-07-03 DIAGNOSIS — I10 Essential (primary) hypertension: Secondary | ICD-10-CM | POA: Diagnosis present

## 2020-07-03 DIAGNOSIS — F411 Generalized anxiety disorder: Secondary | ICD-10-CM | POA: Diagnosis not present

## 2020-07-03 DIAGNOSIS — Z992 Dependence on renal dialysis: Secondary | ICD-10-CM | POA: Diagnosis not present

## 2020-07-03 DIAGNOSIS — T8241XA Breakdown (mechanical) of vascular dialysis catheter, initial encounter: Secondary | ICD-10-CM | POA: Diagnosis not present

## 2020-07-03 DIAGNOSIS — Z20822 Contact with and (suspected) exposure to covid-19: Secondary | ICD-10-CM | POA: Diagnosis not present

## 2020-07-03 DIAGNOSIS — Y828 Other medical devices associated with adverse incidents: Secondary | ICD-10-CM | POA: Diagnosis present

## 2020-07-03 DIAGNOSIS — I120 Hypertensive chronic kidney disease with stage 5 chronic kidney disease or end stage renal disease: Secondary | ICD-10-CM | POA: Diagnosis not present

## 2020-07-03 DIAGNOSIS — F32A Depression, unspecified: Secondary | ICD-10-CM | POA: Diagnosis present

## 2020-07-03 DIAGNOSIS — E1169 Type 2 diabetes mellitus with other specified complication: Secondary | ICD-10-CM | POA: Diagnosis not present

## 2020-07-03 DIAGNOSIS — T8241XD Breakdown (mechanical) of vascular dialysis catheter, subsequent encounter: Secondary | ICD-10-CM | POA: Diagnosis not present

## 2020-07-03 DIAGNOSIS — T82898A Other specified complication of vascular prosthetic devices, implants and grafts, initial encounter: Secondary | ICD-10-CM | POA: Diagnosis not present

## 2020-07-03 DIAGNOSIS — Z66 Do not resuscitate: Secondary | ICD-10-CM | POA: Diagnosis not present

## 2020-07-03 DIAGNOSIS — Y712 Prosthetic and other implants, materials and accessory cardiovascular devices associated with adverse incidents: Secondary | ICD-10-CM | POA: Diagnosis present

## 2020-07-03 DIAGNOSIS — I132 Hypertensive heart and chronic kidney disease with heart failure and with stage 5 chronic kidney disease, or end stage renal disease: Secondary | ICD-10-CM | POA: Diagnosis not present

## 2020-07-03 DIAGNOSIS — T827XXA Infection and inflammatory reaction due to other cardiac and vascular devices, implants and grafts, initial encounter: Secondary | ICD-10-CM | POA: Diagnosis not present

## 2020-07-03 DIAGNOSIS — F419 Anxiety disorder, unspecified: Secondary | ICD-10-CM | POA: Diagnosis present

## 2020-07-03 DIAGNOSIS — J9811 Atelectasis: Secondary | ICD-10-CM | POA: Diagnosis not present

## 2020-07-03 DIAGNOSIS — M797 Fibromyalgia: Secondary | ICD-10-CM | POA: Diagnosis present

## 2020-07-03 HISTORY — PX: INSERTION OF DIALYSIS CATHETER: SHX1324

## 2020-07-03 HISTORY — PX: AV FISTULA PLACEMENT: SHX1204

## 2020-07-03 LAB — CBC WITH DIFFERENTIAL/PLATELET
Abs Immature Granulocytes: 0.1 10*3/uL — ABNORMAL HIGH (ref 0.00–0.07)
Basophils Absolute: 0.1 10*3/uL (ref 0.0–0.1)
Basophils Relative: 1 %
Eosinophils Absolute: 0.3 10*3/uL (ref 0.0–0.5)
Eosinophils Relative: 3 %
HCT: 30.5 % — ABNORMAL LOW (ref 36.0–46.0)
Hemoglobin: 9.6 g/dL — ABNORMAL LOW (ref 12.0–15.0)
Immature Granulocytes: 1 %
Lymphocytes Relative: 16 %
Lymphs Abs: 1.7 10*3/uL (ref 0.7–4.0)
MCH: 28.8 pg (ref 26.0–34.0)
MCHC: 31.5 g/dL (ref 30.0–36.0)
MCV: 91.6 fL (ref 80.0–100.0)
Monocytes Absolute: 0.9 10*3/uL (ref 0.1–1.0)
Monocytes Relative: 8 %
Neutro Abs: 7.9 10*3/uL — ABNORMAL HIGH (ref 1.7–7.7)
Neutrophils Relative %: 71 %
Platelets: 243 10*3/uL (ref 150–400)
RBC: 3.33 MIL/uL — ABNORMAL LOW (ref 3.87–5.11)
RDW: 14.8 % (ref 11.5–15.5)
WBC: 11 10*3/uL — ABNORMAL HIGH (ref 4.0–10.5)
nRBC: 0 % (ref 0.0–0.2)

## 2020-07-03 LAB — GLUCOSE, CAPILLARY
Glucose-Capillary: 140 mg/dL — ABNORMAL HIGH (ref 70–99)
Glucose-Capillary: 141 mg/dL — ABNORMAL HIGH (ref 70–99)
Glucose-Capillary: 140 mg/dL — ABNORMAL HIGH (ref 70–99)
Glucose-Capillary: 118 mg/dL — ABNORMAL HIGH (ref 70–99)
Glucose-Capillary: 217 mg/dL — ABNORMAL HIGH (ref 70–99)

## 2020-07-03 LAB — COMPREHENSIVE METABOLIC PANEL
ALT: 19 U/L (ref 0–44)
AST: 16 U/L (ref 15–41)
Albumin: 2.8 g/dL — ABNORMAL LOW (ref 3.5–5.0)
Alkaline Phosphatase: 73 U/L (ref 38–126)
Anion gap: 14 (ref 5–15)
BUN: 49 mg/dL — ABNORMAL HIGH (ref 8–23)
CO2: 23 mmol/L (ref 22–32)
Calcium: 8.7 mg/dL — ABNORMAL LOW (ref 8.9–10.3)
Chloride: 101 mmol/L (ref 98–111)
Creatinine, Ser: 6.63 mg/dL — ABNORMAL HIGH (ref 0.44–1.00)
GFR, Estimated: 6 mL/min — ABNORMAL LOW (ref >60)
Glucose, Bld: 138 mg/dL — ABNORMAL HIGH (ref 70–99)
Potassium: 4.2 mmol/L (ref 3.5–5.1)
Sodium: 138 mmol/L (ref 135–145)
Total Bilirubin: 0.5 mg/dL (ref 0.3–1.2)
Total Protein: 6.1 g/dL — ABNORMAL LOW (ref 6.5–8.1)

## 2020-07-03 LAB — HEMOGLOBIN A1C
Hgb A1c MFr Bld: 6 % — ABNORMAL HIGH (ref 4.8–5.6)
Mean Plasma Glucose: 125.5 mg/dL

## 2020-07-03 LAB — MAGNESIUM: Magnesium: 1.7 mg/dL (ref 1.7–2.4)

## 2020-07-03 LAB — MRSA PCR SCREENING: MRSA by PCR: NEGATIVE

## 2020-07-03 SURGERY — ARTERIOVENOUS (AV) FISTULA CREATION
Anesthesia: General | Site: Neck | Laterality: Right

## 2020-07-03 MED ORDER — ACETAMINOPHEN 325 MG PO TABS: 650.0000 mg | ORAL_TABLET | Freq: Four times a day (QID) | ORAL | Status: DC | PRN

## 2020-07-03 MED ORDER — CHLORHEXIDINE GLUCONATE CLOTH 2 % EX PADS
6.0000 | MEDICATED_PAD | Freq: Every day | CUTANEOUS | Status: DC
  Administered 2020-07-03: 6 via TOPICAL

## 2020-07-03 MED ORDER — CLONAZEPAM 0.5 MG PO TABS
0.5000 mg | ORAL_TABLET | Freq: Every day | ORAL | Status: DC
  Administered 2020-07-04: 0.5 mg via ORAL
  Filled 2020-07-03: qty 1

## 2020-07-03 MED ORDER — ALBUTEROL SULFATE (2.5 MG/3ML) 0.083% IN NEBU
INHALATION_SOLUTION | RESPIRATORY_TRACT | Status: AC
  Administered 2020-07-03: 2.5 mg via RESPIRATORY_TRACT
  Filled 2020-07-03: qty 3

## 2020-07-03 MED ORDER — INSULIN GLARGINE 100 UNIT/ML ~~LOC~~ SOLN
15.0000 [IU] | Freq: Every day | SUBCUTANEOUS | Status: DC
  Administered 2020-07-04 – 2020-07-05 (×2): 15 [IU] via SUBCUTANEOUS
  Filled 2020-07-03 (×5): qty 0.15

## 2020-07-03 MED ORDER — PHENYLEPHRINE HCL-NACL 10-0.9 MG/250ML-% IV SOLN
INTRAVENOUS | Status: DC | PRN
  Administered 2020-07-03: 25 ug/min via INTRAVENOUS

## 2020-07-03 MED ORDER — HYDRALAZINE HCL 50 MG PO TABS
50.0000 mg | ORAL_TABLET | Freq: Two times a day (BID) | ORAL | Status: DC
  Administered 2020-07-03 – 2020-07-04 (×3): 50 mg via ORAL
  Filled 2020-07-03 (×2): qty 1
  Filled 2020-07-03: qty 2
  Filled 2020-07-03: qty 1

## 2020-07-03 MED ORDER — VENLAFAXINE HCL ER 75 MG PO CP24
150.0000 mg | ORAL_CAPSULE | Freq: Every day | ORAL | Status: DC
  Administered 2020-07-04: 150 mg via ORAL
  Filled 2020-07-03: qty 2

## 2020-07-03 MED ORDER — HEPARIN SODIUM (PORCINE) 1000 UNIT/ML IJ SOLN
INTRAMUSCULAR | Status: AC
  Filled 2020-07-03: qty 1

## 2020-07-03 MED ORDER — HYDROMORPHONE HCL 1 MG/ML IJ SOLN: 0.5000 mg | INTRAMUSCULAR | Status: DC | PRN

## 2020-07-03 MED ORDER — CHLORHEXIDINE GLUCONATE CLOTH 2 % EX PADS
6.0000 | MEDICATED_PAD | Freq: Every day | CUTANEOUS | Status: DC
  Administered 2020-07-04 – 2020-07-05 (×2): 6 via TOPICAL

## 2020-07-03 MED ORDER — LIDOCAINE 2% (20 MG/ML) 5 ML SYRINGE
INTRAMUSCULAR | Status: DC | PRN
  Administered 2020-07-03: 100 mg via INTRAVENOUS

## 2020-07-03 MED ORDER — TORSEMIDE 20 MG PO TABS
40.0000 mg | ORAL_TABLET | Freq: Every day | ORAL | Status: DC
  Administered 2020-07-04 – 2020-07-05 (×2): 40 mg via ORAL
  Filled 2020-07-03 (×2): qty 2

## 2020-07-03 MED ORDER — INSULIN ASPART 100 UNIT/ML IJ SOLN
0.0000 [IU] | Freq: Three times a day (TID) | INTRAMUSCULAR | Status: DC
  Administered 2020-07-03: 3 [IU] via SUBCUTANEOUS
  Administered 2020-07-04 – 2020-07-05 (×2): 1 [IU] via SUBCUTANEOUS

## 2020-07-03 MED ORDER — PHENYLEPHRINE 40 MCG/ML (10ML) SYRINGE FOR IV PUSH (FOR BLOOD PRESSURE SUPPORT)
PREFILLED_SYRINGE | INTRAVENOUS | Status: AC
  Filled 2020-07-03: qty 10

## 2020-07-03 MED ORDER — ATORVASTATIN CALCIUM 40 MG PO TABS
40.0000 mg | ORAL_TABLET | Freq: Every evening | ORAL | Status: DC
  Administered 2020-07-03 – 2020-07-04 (×2): 40 mg via ORAL
  Filled 2020-07-03 (×2): qty 1

## 2020-07-03 MED ORDER — FENTANYL CITRATE (PF) 250 MCG/5ML IJ SOLN
INTRAMUSCULAR | Status: AC
  Filled 2020-07-03: qty 5

## 2020-07-03 MED ORDER — ALBUTEROL SULFATE (2.5 MG/3ML) 0.083% IN NEBU: 2.5000 mg | INHALATION_SOLUTION | RESPIRATORY_TRACT | Status: AC

## 2020-07-03 MED ORDER — EPHEDRINE 5 MG/ML INJ
INTRAVENOUS | Status: AC
  Filled 2020-07-03: qty 10

## 2020-07-03 MED ORDER — CEFAZOLIN SODIUM-DEXTROSE 2-3 GM-%(50ML) IV SOLR
INTRAVENOUS | Status: DC | PRN
  Administered 2020-07-03: 2 g via INTRAVENOUS

## 2020-07-03 MED ORDER — SUGAMMADEX SODIUM 200 MG/2ML IV SOLN
INTRAVENOUS | Status: DC | PRN
  Administered 2020-07-03: 200 mg via INTRAVENOUS

## 2020-07-03 MED ORDER — ROCURONIUM BROMIDE 10 MG/ML (PF) SYRINGE
PREFILLED_SYRINGE | INTRAVENOUS | Status: DC | PRN
  Administered 2020-07-03: 40 mg via INTRAVENOUS

## 2020-07-03 MED ORDER — SODIUM CHLORIDE 0.9 % IV SOLN: INTRAVENOUS | Status: DC

## 2020-07-03 MED ORDER — LIDOCAINE 2% (20 MG/ML) 5 ML SYRINGE
INTRAMUSCULAR | Status: AC
  Filled 2020-07-03: qty 5

## 2020-07-03 MED ORDER — CLOPIDOGREL BISULFATE 75 MG PO TABS
75.0000 mg | ORAL_TABLET | Freq: Every evening | ORAL | Status: DC
  Administered 2020-07-03 – 2020-07-04 (×2): 75 mg via ORAL
  Filled 2020-07-03 (×2): qty 1

## 2020-07-03 MED ORDER — SODIUM CHLORIDE 0.9 % IV SOLN
INTRAVENOUS | Status: AC
  Filled 2020-07-03: qty 1.2

## 2020-07-03 MED ORDER — FENTANYL CITRATE (PF) 100 MCG/2ML IJ SOLN: 25.0000 ug | INTRAMUSCULAR | Status: DC | PRN

## 2020-07-03 MED ORDER — AMLODIPINE BESYLATE 10 MG PO TABS
10.0000 mg | ORAL_TABLET | Freq: Every evening | ORAL | Status: DC
  Administered 2020-07-03: 10 mg via ORAL
  Filled 2020-07-03: qty 2

## 2020-07-03 MED ORDER — ACETAMINOPHEN 650 MG RE SUPP: 650.0000 mg | Freq: Four times a day (QID) | RECTAL | Status: DC | PRN

## 2020-07-03 MED ORDER — ONDANSETRON HCL 4 MG PO TABS: 4.0000 mg | ORAL_TABLET | Freq: Four times a day (QID) | ORAL | Status: DC | PRN

## 2020-07-03 MED ORDER — ONDANSETRON HCL 4 MG/2ML IJ SOLN: 4.0000 mg | Freq: Four times a day (QID) | INTRAMUSCULAR | Status: DC | PRN

## 2020-07-03 MED ORDER — ROCURONIUM BROMIDE 10 MG/ML (PF) SYRINGE
PREFILLED_SYRINGE | INTRAVENOUS | Status: AC
  Filled 2020-07-03: qty 10

## 2020-07-03 MED ORDER — ACETAMINOPHEN 10 MG/ML IV SOLN: 1000.0000 mg | Freq: Once | INTRAVENOUS | Status: DC | PRN

## 2020-07-03 MED ORDER — HEPARIN SODIUM (PORCINE) 1000 UNIT/ML IJ SOLN
INTRAMUSCULAR | Status: DC | PRN
  Administered 2020-07-03: 3800 [IU]

## 2020-07-03 MED ORDER — SODIUM CHLORIDE 0.9 % IV SOLN
INTRAVENOUS | Status: DC | PRN
  Administered 2020-07-03: 500 mL

## 2020-07-03 MED ORDER — ONDANSETRON HCL 4 MG/2ML IJ SOLN
INTRAMUSCULAR | Status: DC | PRN
  Administered 2020-07-03: 4 mg via INTRAVENOUS

## 2020-07-03 MED ORDER — PROPOFOL 10 MG/ML IV BOLUS
INTRAVENOUS | Status: DC | PRN
  Administered 2020-07-03: 90 mg via INTRAVENOUS

## 2020-07-03 MED ORDER — FENTANYL CITRATE (PF) 100 MCG/2ML IJ SOLN
INTRAMUSCULAR | Status: DC | PRN
  Administered 2020-07-03 (×2): 25 ug via INTRAVENOUS

## 2020-07-03 MED ORDER — 0.9 % SODIUM CHLORIDE (POUR BTL) OPTIME
TOPICAL | Status: DC | PRN
  Administered 2020-07-03: 1000 mL

## 2020-07-03 MED ORDER — HYDRALAZINE HCL 20 MG/ML IJ SOLN: 10.0000 mg | Freq: Four times a day (QID) | INTRAMUSCULAR | Status: DC | PRN

## 2020-07-03 MED ORDER — CHLORHEXIDINE GLUCONATE 0.12 % MT SOLN
15.0000 mL | OROMUCOSAL | Status: AC
  Administered 2020-07-03: 15 mL via OROMUCOSAL
  Filled 2020-07-03: qty 15

## 2020-07-03 MED ORDER — METOPROLOL SUCCINATE ER 100 MG PO TB24
100.0000 mg | ORAL_TABLET | Freq: Every evening | ORAL | Status: DC
  Administered 2020-07-03: 100 mg via ORAL
  Filled 2020-07-03: qty 1

## 2020-07-03 MED ORDER — OXYCODONE-ACETAMINOPHEN 5-325 MG PO TABS: 1.0000 | ORAL_TABLET | ORAL | Status: DC | PRN

## 2020-07-03 MED ORDER — SUCCINYLCHOLINE CHLORIDE 200 MG/10ML IV SOSY
PREFILLED_SYRINGE | INTRAVENOUS | Status: DC | PRN
  Administered 2020-07-03: 140 mg via INTRAVENOUS

## 2020-07-03 MED ORDER — DEXAMETHASONE SODIUM PHOSPHATE 10 MG/ML IJ SOLN
INTRAMUSCULAR | Status: AC
  Filled 2020-07-03: qty 1

## 2020-07-03 MED ORDER — ONDANSETRON HCL 4 MG/2ML IJ SOLN: 4.0000 mg | Freq: Once | INTRAMUSCULAR | Status: DC | PRN

## 2020-07-03 MED ORDER — POLYETHYLENE GLYCOL 3350 17 G PO PACK: 17.0000 g | PACK | Freq: Every day | ORAL | Status: DC | PRN

## 2020-07-03 MED ORDER — LIDOCAINE-EPINEPHRINE 1 %-1:100000 IJ SOLN
INTRAMUSCULAR | Status: AC
  Filled 2020-07-03: qty 1

## 2020-07-03 MED ORDER — ONDANSETRON HCL 4 MG/2ML IJ SOLN
INTRAMUSCULAR | Status: AC
  Filled 2020-07-03: qty 2

## 2020-07-03 MED ORDER — PROPOFOL 10 MG/ML IV BOLUS
INTRAVENOUS | Status: AC
  Filled 2020-07-03: qty 20

## 2020-07-03 MED ORDER — HEPARIN SODIUM (PORCINE) 1000 UNIT/ML IJ SOLN
INTRAMUSCULAR | Status: AC
  Filled 2020-07-03: qty 3

## 2020-07-03 SURGICAL SUPPLY — 56 items
ADH SKN CLS APL DERMABOND .7 (GAUZE/BANDAGES/DRESSINGS) ×4
ARMBAND PINK RESTRICT EXTREMIT (MISCELLANEOUS) ×3 IMPLANT
BAG DECANTER FOR FLEXI CONT (MISCELLANEOUS) ×3 IMPLANT
BIOPATCH RED 1 DISK 7.0 (GAUZE/BANDAGES/DRESSINGS) ×3 IMPLANT
BNDG ELASTIC 4X5.8 VLCR STR LF (GAUZE/BANDAGES/DRESSINGS) ×1 IMPLANT
BNDG GAUZE ELAST 4 BULKY (GAUZE/BANDAGES/DRESSINGS) ×1 IMPLANT
CANISTER SUCT 3000ML PPV (MISCELLANEOUS) ×3 IMPLANT
CATH PALINDROME-P 19CM W/VT (CATHETERS) ×1 IMPLANT
CATH PALINDROME-P 23CM W/VT (CATHETERS) IMPLANT
CATH PALINDROME-P 28CM W/VT (CATHETERS) IMPLANT
CLIP VESOCCLUDE MED 6/CT (CLIP) ×3 IMPLANT
CLIP VESOCCLUDE SM WIDE 6/CT (CLIP) ×3 IMPLANT
COVER PROBE W GEL 5X96 (DRAPES) ×3 IMPLANT
COVER SURGICAL LIGHT HANDLE (MISCELLANEOUS) ×3 IMPLANT
COVER WAND RF STERILE (DRAPES) ×6 IMPLANT
DERMABOND ADVANCED (GAUZE/BANDAGES/DRESSINGS) ×2
DERMABOND ADVANCED .7 DNX12 (GAUZE/BANDAGES/DRESSINGS) ×4 IMPLANT
DRAPE C-ARM 42X72 X-RAY (DRAPES) ×3 IMPLANT
DRAPE CHEST BREAST 15X10 FENES (DRAPES) ×3 IMPLANT
DRSG COVADERM 4X6 (GAUZE/BANDAGES/DRESSINGS) ×2 IMPLANT
ELECT REM PT RETURN 9FT ADLT (ELECTROSURGICAL) ×3
ELECTRODE REM PT RTRN 9FT ADLT (ELECTROSURGICAL) ×2 IMPLANT
GAUZE 4X4 16PLY RFD (DISPOSABLE) ×3 IMPLANT
GAUZE SPONGE 4X4 12PLY STRL (GAUZE/BANDAGES/DRESSINGS) ×1 IMPLANT
GLOVE BIO SURGEON STRL SZ7.5 (GLOVE) ×6 IMPLANT
GLOVE SURG UNDER POLY LF SZ6.5 (GLOVE) ×1 IMPLANT
GOWN STRL REUS W/ TWL LRG LVL3 (GOWN DISPOSABLE) ×6 IMPLANT
GOWN STRL REUS W/ TWL XL LVL3 (GOWN DISPOSABLE) ×4 IMPLANT
GOWN STRL REUS W/TWL LRG LVL3 (GOWN DISPOSABLE) ×9
GOWN STRL REUS W/TWL XL LVL3 (GOWN DISPOSABLE) ×6
INSERT FOGARTY SM (MISCELLANEOUS) IMPLANT
KIT BASIN OR (CUSTOM PROCEDURE TRAY) ×6 IMPLANT
KIT PALINDROME-P 55CM (CATHETERS) IMPLANT
KIT TURNOVER KIT B (KITS) ×6 IMPLANT
NDL 18GX1X1/2 (RX/OR ONLY) (NEEDLE) ×2 IMPLANT
NDL HYPO 25GX1X1/2 BEV (NEEDLE) ×2 IMPLANT
NEEDLE 18GX1X1/2 (RX/OR ONLY) (NEEDLE) ×3 IMPLANT
NEEDLE HYPO 25GX1X1/2 BEV (NEEDLE) ×3 IMPLANT
NS IRRIG 1000ML POUR BTL (IV SOLUTION) ×6 IMPLANT
PACK CV ACCESS (CUSTOM PROCEDURE TRAY) ×3 IMPLANT
PACK SURGICAL SETUP 50X90 (CUSTOM PROCEDURE TRAY) ×3 IMPLANT
PAD ARMBOARD 7.5X6 YLW CONV (MISCELLANEOUS) ×12 IMPLANT
SOAP 2 % CHG 4 OZ (WOUND CARE) ×3 IMPLANT
SUT ETHILON 3 0 PS 1 (SUTURE) ×3 IMPLANT
SUT MNCRL AB 4-0 PS2 18 (SUTURE) ×6 IMPLANT
SUT PROLENE 6 0 BV (SUTURE) ×3 IMPLANT
SUT VIC AB 3-0 SH 27 (SUTURE) ×3
SUT VIC AB 3-0 SH 27X BRD (SUTURE) ×2 IMPLANT
SYR 10ML LL (SYRINGE) ×3 IMPLANT
SYR 20ML LL LF (SYRINGE) ×6 IMPLANT
SYR 5ML LL (SYRINGE) ×3 IMPLANT
SYR CONTROL 10ML LL (SYRINGE) ×3 IMPLANT
TOWEL GREEN STERILE (TOWEL DISPOSABLE) ×6 IMPLANT
TOWEL GREEN STERILE FF (TOWEL DISPOSABLE) ×6 IMPLANT
UNDERPAD 30X36 HEAVY ABSORB (UNDERPADS AND DIAPERS) ×3 IMPLANT
WATER STERILE IRR 1000ML POUR (IV SOLUTION) ×6 IMPLANT

## 2020-07-03 NOTE — Progress Notes (Signed)
ESRD   Dialysis dep   [place]  Dialysis   Ulcerated right upper arm at site of previous graft     Malfunctioning dialysis catheter    Admitted observation   For  New tunneled dialysis catheter and removal of part of RUA thrombosed graft  BP 106/62     Sats 100 %  K 4.2   Ca 8.7     Dialysis orders in for 07/03/20    Anticipated discharge 07/04/20

## 2020-07-03 NOTE — Progress Notes (Signed)
Pt has not had any medications today d/t being NPO, in surgery and then taken to HD. Will evaluate pt for PO meds once she arrives back to the unit.

## 2020-07-03 NOTE — Progress Notes (Addendum)
PROGRESS NOTE                                                                             PROGRESS NOTE                                                                                                                                                                                                             Patient Demographics:    Kelli [name], is a 70 y.o. female, DOB - [birth year], VZS:[id number]  Outpatient Primary MD for the patient is [name], [name], MD    LOS - 0  Admit date - 07/02/2020    Chief Complaint  Patient presents with  . Vascular Access Problem       Brief Narrative    This is a no charge note as patient was seen and admitted earlier today by Dr. Cyd [name], chart, imaging, labs were reviewed, patient was seen and examined.   70 year old female with past medical history of end-stage renal disease (Tues Thurs Sat), thrombosed and nonfunctioning bilateral upper extremity AV grafts, status post right IJ tunneled dialysis catheter, cryptogenic strokes in the past, insulin-dependent diabetes mellitus type 2, chronic diastolic congestive heart failure (Echo 08/2019 EF 60-65% with G1DD), hypertension, anxiety disorder presenting to [facility] emergency department as a transfer from [facility] due to complaints of generalized fatigue.  Patient explains that over the past week she has been developing worsening fatigue.  This fatigue wound is initially mild in intensity but now severe.  Fatigue is associated with shortness of breath that is worse with exertion and improved with rest.  Patient complains of associated audible wheezing and occasional cough that is productive with clear sputum.  Patient denies any associated peripheral edema or leg swelling.    Of note, patient also complains of some redness and discomfort of the right upper extremity graft as well.  Patient denies any associated swelling.  Patient denies any fevers, sick  contacts, recent travel or contact with confirmed COVID-19 infection.  Patient explains that on  Tuesday she was unable to complete her dialysis session at her usual dialysis center in [place] due to a malfunctioning hemodialysis catheter.  Ranges have been made for the patient to follow-up with vascular surgery in the outpatient setting for evaluation of this catheter however due to progressively worsening symptoms as noted above the patient eventually presented to [facility] emergency department for evaluation.  Upon evaluation at [facility] emergency department Case was discussed with Dr. Stanford [name] with vascular surgery who recommended transfer to [facility] for vascular surgery evaluation.  Upon arrival at [facility] emergency department patient was evaluated by vascular surgery and arranges have been made for the patient to undergo replacement of the tunneled dialysis catheter.  Right AV graft was also evaluated by vascular surgery and was not felt to be infected however partial excision will be performed in the OR in the morning as well.  The hospitalist group was then called to assess the patient for admission to the hospital      Subjective:    Kelli [name] today is currently postoperative, sleeping comfortable in bed, no apparent distress, sleepy, but wakes up and answer to verbal stimuli.     Assessment  & Plan :    Principal Problem:   Hemodialysis catheter dysfunction, initial encounter ([place]) Active Problems:   Generalized anxiety disorder   Acute on chronic diastolic CHF (congestive heart failure) (HCC)   Type 2 diabetes mellitus with chronic kidney disease on chronic dialysis, with long-term current use of insulin (HCC)   ESRD (end stage renal disease) on dialysis ([place])   Mixed diabetic hyperlipidemia associated with type 2 diabetes mellitus ([place])   Essential hypertension    Hemodialysis catheter dysfunction, initial encounter [facility])   Patient presenting with  progressively worsening malaise weakness and shortness of breath in the setting of a nonfunctioning hemodialysis catheter and inability to complete her full dialysis session on 5/3.  Patient is already been evaluated by vascular surgery and plan is for patient to go for tunneled catheter replacement in the OR on 5/5  -Vascular surgery consulted, right aVF was discontinued, and she had her right IJ tunneled hemodialysis catheter exchanged.   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  No obvious systemic evidence of infection  Patient to had excision of this AV graft in the OR with vascular surgery on 5/5.    Generalized anxiety disorder   Continue home regimen of anxiolytics and psychotropic agents  Acute on chronic diastolic CHF (congestive heart failure) ([place])   Patient is exhibiting coarse breath sounds with rales and wheezing on lung exam in the setting of a several day history of progressively worsening shortness of breath  Chest x-ray reveals some evidence of pulmonary edema  Patient is presumably suffering from mild acute on chronic diastolic congestive heart failure due to ineffective dialysis  We will coordinate with nephrology on day shift tomorrow morning for prompt evaluation for resumption of dialysis after the tunneled hemodialysis catheter is replaced    Type 2 diabetes mellitus with chronic kidney disease on chronic dialysis, with long-term current use of insulin ([place])   Accu-Cheks before every meal and nightly with sliding scale insulin  Resume home regimen of  basal insulin therapy  Hemoglobin A1c pending    ESRD (end stage renal disease) on dialysis [facility])   Renal  consulted, he will be dialyzed this evening    Mixed diabetic hyperlipidemia associated with type 2 diabetes mellitus ([place])   Continue home regimen of statin therapy  Essential hypertension   Resuming home regimen of antihypertensive therapy   SpO2: 92 % O2 Flow Rate (L/min): 2 L/min  Recent Labs  Lab 07/02/20 1705 07/02/20 2059 07/03/20 0539  WBC  --  10.3 11.0*  PLT  --  258 243  AST  --   --  16  ALT  --   --  19  ALKPHOS  --   --  73  BILITOT  --   --  0.5  ALBUMIN  --   --  2.8*  SARSCOV2NAA NEGATIVE  --   --        ABG     Component Value Date/Time   PHART 7.488 (H) 01/19/2017 0127   PCO2ART 37.6 01/19/2017 0127   PO2ART 124 (H) 01/19/2017 0127   HCO3 28.9 (H) 01/19/2017 0127   TCO2 25 06/06/2019 1033   O2SAT 98.8 01/19/2017 0127          Condition - Extremely Guarded  Family Communication  :  Husband at bedside  Code Status :  full  Consults  :  Renal, vascular surgery  Procedures  :   Dr. Servando Snare 07/03/2020 1.  Replacement of right IJ 19 cm tunneled dialysis catheter with fluoroscopic guidance 2.  Excision of right upper arm AV graft and stent   Disposition Plan  :    Status is: Observation  The patient remains OBS appropriate and will d/c before 2 midnights.  Dispo: The patient is from: Home              Anticipated d/c is to: Home              Patient currently is medically stable to d/c.   Difficult to place patient No      DVT Prophylaxis  : SCDs   Lab Results  Component Value Date   PLT 243 07/03/2020    Diet :  Diet Order            Diet renal with fluid restriction Fluid restriction: 1200 mL Fluid; Room service appropriate? Yes; Fluid consistency: Thin  Diet effective now                  Inpatient Medications  Scheduled Meds: . amLODipine  10 mg Oral QPM  . atorvastatin  40 mg Oral QPM  . [START ON 07/04/2020] Chlorhexidine Gluconate Cloth  6 each Topical Q0600  . clonazePAM  0.5 mg Oral Daily   . clopidogrel  75 mg Oral QPM  . hydrALAZINE  50 mg Oral BID  . insulin aspart  0-9 Units Subcutaneous TID AC & HS  . insulin glargine  15 Units Subcutaneous Daily  . metoprolol succinate  100 mg Oral QPM  . torsemide  40 mg Oral Daily  . venlafaxine XR  150 mg Oral Daily   Continuous Infusions: . sodium chloride 10 mL/hr at 07/03/20 0822   PRN Meds:.acetaminophen **OR** acetaminophen, hydrALAZINE, HYDROmorphone (DILAUDID) injection, ondansetron **OR** ondansetron (ZOFRAN) IV, oxyCODONE-acetaminophen, polyethylene glycol  Antibiotics  :    Anti-infectives (From admission, onward)   None      Phillips Climes M.D on 07/03/2020 at 2:07 PM  To page go to [email or web address] -  [facility] -  Office  [phone]        Objective:   Vitals:   07/03/20 1035 07/03/20 1050 07/03/20 1105 07/03/20 1126  BP: 103/63 (!) 99/58 103/69 106/62  Pulse:  76 73 73  Resp: 13 12 14 18   Temp:   98.4 F (36.9 C) 97.8 F (36.6 C)  TempSrc:    Oral  SpO2:  100% 100% 92%  Weight:      Height:        Wt Readings from Last 3 Encounters:  07/03/20 90.7 kg  04/16/20 83 kg  12/19/19 89.8 kg     Intake/Output Summary (Last 24 hours) at 07/03/2020 1407 Last data filed at 07/03/2020 1001 Gross per 24 hour  Intake 250 ml  Output --  Net 250 ml     Physical Exam  Sleeping, NAD, open eyes to verbal command ,then goes back to sleep Supple Neck,No JVD, No cervical lymphadenopathy appriciated.  Symmetrical Chest wall movement, Good air movement bilaterally, CTAB RRR,No Gallops,Rubs or new Murmurs, No Parasternal Heave +ve B.Sounds, Abd Soft, No tenderness,  No Cyanosis, Clubbing or edema, No new Rash or bruise   Left arm bandaged post operatively    Data Review:    CBC Recent Labs  Lab 07/02/20 2059 07/03/20 0539  WBC 10.3 11.0*  HGB 9.1* 9.6*  HCT 29.7* 30.5*  PLT 258 243  MCV 94.3 91.6  MCH 28.9 28.8  MCHC 30.6 31.5  RDW 14.9 14.8  LYMPHSABS 2.0 1.7  MONOABS 0.9 0.9   EOSABS 0.3 0.3  BASOSABS 0.0 0.1    Recent Labs  Lab 07/02/20 2059 07/03/20 0539  NA 136 138  K 4.3 4.2  CL 101 101  CO2 21* 23  GLUCOSE 102* 138*  BUN 44* 49*  CREATININE 6.19* 6.63*  CALCIUM 8.6* 8.7*  AST  --  16  ALT  --  19  ALKPHOS  --  73  BILITOT  --  0.5  ALBUMIN  --  2.8*  MG  --  1.7  HGBA1C  --  6.0*    ------------------------------------------------------------------------------------------------------------------ No results for input(s): CHOL, HDL, LDLCALC, TRIG, CHOLHDL, LDLDIRECT in the last 72 hours.  Lab Results  Component Value Date   HGBA1C 6.0 (H) 07/03/2020   ------------------------------------------------------------------------------------------------------------------ No results for input(s): TSH, T4TOTAL, T3FREE, THYROIDAB in the last 72 hours.  Invalid input(s): FREET3  Cardiac Enzymes No results for input(s): CKMB, TROPONINI, MYOGLOBIN in the last 168 hours.  Invalid input(s): CK ------------------------------------------------------------------------------------------------------------------    Component Value Date/Time   BNP 1,264.0 (H) 01/27/2017 2340    Micro Results Recent Results (from the past 240 hour(s))  Resp Panel by RT-PCR (Flu A&B, Covid) Nasopharyngeal Swab     Status: None   Collection Time: 07/02/20  5:05 PM   Specimen: Nasopharyngeal Swab; Nasopharyngeal(NP) swabs in vial transport medium  Result Value Ref Range Status   SARS Coronavirus 2 by RT PCR NEGATIVE NEGATIVE Final    Comment: (NOTE) SARS-CoV-2 target nucleic acids are NOT DETECTED.  The SARS-CoV-2 RNA is generally detectable in upper respiratory specimens during the acute phase of infection. The lowest concentration of SARS-CoV-2 viral copies this assay can detect is 138 copies/mL. A negative result does not preclude SARS-Cov-2 infection and should not be used as the sole basis for treatment or other patient management decisions. A negative result  may occur with  improper specimen collection/handling, submission of specimen other than nasopharyngeal swab, presence of viral mutation(s) within the areas targeted by this  assay, and inadequate number of viral copies(<138 copies/mL). A negative result must be combined with clinical observations, patient history, and epidemiological information. The expected result is Negative.  Fact Sheet for Patients:  EntrepreneurPulse.com.au  Fact Sheet for Healthcare Providers:  IncredibleEmployment.be  This test is no t yet approved or cleared by the [place] FDA and  has been authorized for detection and/or diagnosis of SARS-CoV-2 by FDA under an Emergency Use Authorization (EUA). This EUA will remain  in effect (meaning this test can be used) for the duration of the COVID-19 declaration under Section 564(b)(1) of the Act, 21 U.S.C.section 360bbb-3(b)(1), unless the authorization is terminated  or revoked sooner.       Influenza A by PCR NEGATIVE NEGATIVE Final   Influenza B by PCR NEGATIVE NEGATIVE Final    Comment: (NOTE) The Xpert Xpress SARS-CoV-2/FLU/RSV plus assay is intended as an aid in the diagnosis of influenza from Nasopharyngeal swab specimens and should not be used as a sole basis for treatment. Nasal washings and aspirates are unacceptable for Xpert Xpress SARS-CoV-2/FLU/RSV testing.  Fact Sheet for Patients: EntrepreneurPulse.com.au  Fact Sheet for Healthcare Providers: IncredibleEmployment.be  This test is not yet approved or cleared by the [place] FDA and has been authorized for detection and/or diagnosis of SARS-CoV-2 by FDA under an Emergency Use Authorization (EUA). This EUA will remain in effect (meaning this test can be used) for the duration of the COVID-19 declaration under Section 564(b)(1) of the Act, 21 U.S.C. section 360bbb-3(b)(1), unless the authorization is terminated  or revoked.  Performed at [facility], [address], [place], [place] [address]   MRSA PCR Screening     Status: None   Collection Time: 07/03/20  5:14 AM   Specimen: Nasal Mucosa; Nasopharyngeal  Result Value Ref Range Status   MRSA by PCR NEGATIVE NEGATIVE Final    Comment:        The GeneXpert MRSA Assay (FDA approved for NASAL specimens only), is one component of a comprehensive MRSA colonization surveillance program. It is not intended to diagnose MRSA infection nor to guide or monitor treatment for MRSA infections. Performed at [facility], [place] [address], [place],  [address]     Radiology Reports DG CHEST PORT 1 VIEW  Result Date: 07/03/2020 CLINICAL DATA:  Dialysis catheter placement EXAM: PORTABLE CHEST 1 VIEW COMPARISON:  Jul 02, 2020 FINDINGS: Dual lumen catheter tip at cavoatrial junction. No pneumothorax. There is mild left base atelectasis. No edema or airspace opacity. There is cardiomegaly with pulmonary vascularity normal. No adenopathy. There is aortic atherosclerosis. There is a loop recorder on the left. There is a stent in the left brachial region. Bones are somewhat osteoporotic. IMPRESSION: Dual lumen catheter tip at cavoatrial junction without pneumothorax. No edema or airspace opacity. Mild left base atelectasis. Stable cardiomegaly. Loop recorder on the left. Aortic Atherosclerosis (ICD10-I70.0). Electronically Signed   By: Lowella Grip III M.D.   On: 07/03/2020 11:20   DG Chest Portable 1 View  Result Date: 07/02/2020 CLINICAL DATA:  Cough EXAM: PORTABLE CHEST 1 VIEW COMPARISON:  04/16/2020 FINDINGS: Right-sided central venous catheter tip at the cavoatrial region. Electronic recording device over left chest. Cardiomegaly with probable small left effusion. Mild central congestion. Aortic atherosclerosis. No pneumothorax. Upper extremity vascular stents. IMPRESSION: 1. Right-sided central venous catheter tip over the cavoatrial region 2.  Cardiomegaly with slight central congestion and probable small left effusion Electronically Signed   By: Donavan Foil M.D.   On: 07/02/2020 16:40  DG Fluoro Guide CV Line-No Report  Result Date: 07/03/2020 Fluoroscopy was utilized by the requesting physician.  No radiographic interpretation.

## 2020-07-03 NOTE — Progress Notes (Signed)
R chest HD cath with gauze dressing. Noted plan for replacement today; dressing change deferred.

## 2020-07-03 NOTE — H&P (Addendum)
History and Physical    Kelli ARVANITIS [name]:[id number] DOB: 1950-05-17 DOA: 07/02/2020  PCP: Manon Hilding, MD  Patient coming from: [facility] ED   Chief Complaint:  Chief Complaint  Patient presents with  . Vascular Access Problem     HPI:    70 year old female with past medical history of end-stage renal disease (Tues Thurs Sat), thrombosed and nonfunctioning bilateral upper extremity AV grafts, status post right IJ tunneled dialysis catheter, cryptogenic strokes in the past, insulin-dependent diabetes mellitus type 2, chronic diastolic congestive heart failure (Echo 08/2019 EF 60-65% with G1DD), hypertension, anxiety disorder presenting to [facility] emergency department as a transfer from [facility] due to complaints of generalized fatigue.  Patient explains that over the past week she has been developing worsening fatigue.  This fatigue wound is initially mild in intensity but now severe.  Fatigue is associated with shortness of breath that is worse with exertion and improved with rest.  Patient complains of associated audible wheezing and occasional cough that is productive with clear sputum.  Patient denies any associated peripheral edema or leg swelling.    Of note, patient also complains of some redness and discomfort of the right upper extremity graft as well.  Patient denies any associated swelling.  Patient denies any fevers, sick contacts, recent travel or contact with confirmed COVID-19 infection.  Patient explains that on Tuesday she was unable to complete her dialysis session at her usual dialysis center in [place] due to a malfunctioning hemodialysis catheter.  Ranges have been made for the patient to follow-up with vascular surgery in the outpatient setting for evaluation of this catheter however due to progressively worsening symptoms as noted above the patient eventually presented to [facility] emergency department for evaluation.  Upon evaluation at  [facility] emergency department Case was discussed with Dr. Stanford Breed with vascular surgery who recommended transfer to [facility] for vascular surgery evaluation.  Upon arrival at [facility] emergency department patient was evaluated by vascular surgery and arranges have been made for the patient to undergo replacement of the tunneled dialysis catheter.  Right AV graft was also evaluated by vascular surgery and was not felt to be infected however partial excision will be performed in the OR in the morning as well.  The hospitalist group was then called to assess the patient for admission to the hospital    Review of Systems:   Review of Systems  Constitutional: Positive for malaise/fatigue.  Respiratory: Positive for cough, sputum production, shortness of breath and wheezing.   Neurological: Positive for weakness.  All other systems reviewed and are negative.   Past Medical History:  Diagnosis Date  . Anemia   . Anxiety   . Carotid artery occlusion    Occluded RICA, status post left CEA  August 2014 - Dr. Donnetta Hutching  . Cerebral infarction [facility]) Aug 2014   Bihemispheric watershed infarcts  . Cerebral infarction involving left cerebellar artery [facility]) Feb 2015  . CHF (congestive heart failure) ([place])   . CKD (chronic kidney disease) stage 3, GFR 30-59 ml/min (HCC)    dialysis [place]  . Closed dislocation of left humerus 07/26/2013  . Depression   . DM (diabetes mellitus), type 2 ([place])   . Dyspnea    with exertion  . Essential hypertension, benign   . Fibromyalgia   . Mixed hyperlipidemia   . Multiple gastric ulcers   . Pneumonia   . Stroke [facility])    pt states she  cannot walk, left arm weakness  . Urinary incontinence     Past Surgical History:  Procedure Laterality Date  . A/V FISTULAGRAM N/A 06/06/2019   Procedure: A/V FISTULAGRAM;  Surgeon: Marty Heck, MD;  Location: [place] CV LAB;  Service: Cardiovascular;  Laterality: N/A;  . AV FISTULA PLACEMENT Left  04/15/2017   Procedure: ARTERIOVENOUS (AV) FISTULA CREATION LEFT ARM;  Surgeon: Serafina Mitchell, MD;  Location: [facility] OR;  Service: Vascular;  Laterality: Left;  . AV FISTULA PLACEMENT Left 05/09/2017   Procedure: INSERTION OF ARTERIOVENOUS (AV) GORE-TEX GRAFT LEFT UPPER ARM;  Surgeon: [name], MD;  Location: [facility] OR;  Service: Vascular;  Laterality: Left;  . AV FISTULA PLACEMENT Right 09/06/2018   Procedure: INSERTION OF ARTERIOVENOUS (AV) GORE-TEX GRAFT RIGHT  ARM;  Surgeon: [name], MD;  Location: [place];  Service: Vascular;  Laterality: Right;  . BASCILIC VEIN TRANSPOSITION Right 08/09/2018   Procedure: BASILIC VEIN TRANSPOSITION 1st Stage;  Surgeon: [name], MD;  Location: [place];  Service: Vascular;  Laterality: Right;  . BIOPSY  01/22/2017   Procedure: BIOPSY;  Surgeon: Danie Binder, MD;  Location: [facility];  Service: Endoscopy;;  gastric  . COMBINED HYSTERECTOMY VAGINAL W/ MMK / A&P REPAIR  1981  . ENDARTERECTOMY Left 10/06/2012   Procedure: Carotid Endarterectomy with Finesse patch angioplasty;  Surgeon: [name], MD;  Location: [place];  Service: Vascular;  Laterality: Left;  . ESOPHAGOGASTRODUODENOSCOPY N/A 04/26/2014   Procedure: ESOPHAGOGASTRODUODENOSCOPY (EGD);  Surgeon: Lear Ng, MD;  Location: [facility] ENDOSCOPY;  Service: Endoscopy;  Laterality: N/A;  . ESOPHAGOGASTRODUODENOSCOPY N/A 01/22/2017   Procedure: ESOPHAGOGASTRODUODENOSCOPY (EGD);  Surgeon: Danie Binder, MD;  [facility];  Service: Endoscopy;  Laterality: N/A;  . FLEXIBLE SIGMOIDOSCOPY N/A 01/22/2017   Procedure: FLEXIBLE SIGMOIDOSCOPY;  Surgeon: Danie Binder, MD;  [facility];  Service: Endoscopy;  Laterality: N/A;  . IR CV LINE INJECTION  02/04/2017  . IR CV LINE INJECTION  04/28/2018  . IR CV LINE INJECTION  05/12/2018  . IR CV LINE INJECTION  05/18/2018  . IR FLUORO GUIDE CV LINE LEFT  01/14/2017  . IR FLUORO GUIDE CV LINE LEFT  04/25/2017  . IR FLUORO GUIDE CV LINE  LEFT  05/13/2017  . IR FLUORO GUIDE CV LINE LEFT  03/02/2018  . IR FLUORO GUIDE CV LINE LEFT  04/28/2018  . IR FLUORO GUIDE CV LINE LEFT  05/12/2018  . IR FLUORO GUIDE CV LINE LEFT  05/18/2018  . IR FLUORO GUIDE CV LINE LEFT  06/23/2018  . IR FLUORO GUIDE CV LINE LEFT  08/02/2018  . IR PTA VENOUS EXCEPT DIALYSIS CIRCUIT  04/28/2018  . IR PTA VENOUS EXCEPT DIALYSIS CIRCUIT  05/12/2018  . IR REMOVAL TUN CV CATH W/O FL  07/08/2017  . IR REMOVAL TUN CV CATH W/O FL  10/11/2018  . IR TRANSCATH RETRIEVAL FB INCL GUIDANCE (MS)  05/12/2018  . IR US GUIDE VASC ACCESS LEFT  01/14/2017  . LOOP RECORDER IMPLANT  04/16/13   MDT LinQ implanted for cryptogenic stroke  . PERIPHERAL VASCULAR INTERVENTION Right 06/06/2019   Procedure: PERIPHERAL VASCULAR INTERVENTION;  Surgeon: Marty Heck, MD;  Location: [place] CV LAB;  Service: Cardiovascular;  Laterality: Right;  . [name] WITHOUT CARDIOVERSION N/A 04/16/2013   Procedure: TRANSESOPHAGEAL ECHOCARDIOGRAM ([name]);  Surgeon: Josue Hector, MD;  Location: [place];  Service: Cardiovascular;  Laterality: N/A;  . THROMBECTOMY AND REVISION OF ARTERIOVENTOUS (AV)  GORETEX  GRAFT Left 07/24/2017   Procedure: THROMBECTOMY OF LEFT UPPER ARM ARTERIOVENTOUS (AV) GRAFT;  Surgeon: Conrad Medical Lake, MD;  Location: [place];  Service: Vascular;  Laterality: Left;  . URETHRAL DILATION  1980's  . VAGINAL HYSTERECTOMY  1981   "partial" (10/04/2012)     reports that she quit smoking about 7 years ago. Her smoking use included cigarettes. She has a 44.00 pack-year smoking history. She has never used smokeless tobacco. She reports that she does not drink alcohol and does not use drugs.  Allergies  Allergen Reactions  . Hydrochlorothiazide Nausea And Vomiting    Family History  Problem Relation Age of Onset  . Diabetes Mother   . Diabetes Father   . Hypertension Sister   . Diabetes Brother   . Seizures Son   . Kidney disease Maternal Grandmother   . Hypertension Maternal  Grandmother   . Heart disease Maternal Grandfather   . Diabetes Paternal Grandmother   . Heart disease Paternal Grandfather      Prior to Admission medications   Medication Sig Start Date End Date Taking? Authorizing Provider  acetaminophen (TYLENOL) 500 MG tablet Take 1,000 mg by mouth every 6 (six) hours as needed for moderate pain or headache.   Yes [provider]  amLODipine (NORVASC) 10 MG tablet Take 10 mg by mouth every evening.    Yes [provider]  atorvastatin (LIPITOR) 40 MG tablet Take 40 mg by mouth every evening.  05/17/18  Yes [provider]  bismuth subsalicylate (PEPTO BISMOL) 262 MG/15ML suspension Take 30 mLs by mouth every 6 (six) hours as needed for indigestion.   Yes [provider]  clonazePAM (KLONOPIN) 0.5 MG tablet Take 0.5 mg by mouth daily.   Yes [provider]  clopidogrel (PLAVIX) 75 MG tablet Take 1 tablet (75 mg total) by mouth daily. Patient taking differently: Take 75 mg by mouth every evening. 01/31/17  Yes Kathie Dike, MD  hydrALAZINE (APRESOLINE) 50 MG tablet Take 1 tablet (50 mg total) by mouth 3 (three) times daily. Patient taking differently: Take 50 mg by mouth 2 (two) times daily. 10/20/16  Yes Cherene Altes, MD  Insulin Glargine (BASAGLAR KWIKPEN) 100 UNIT/ML SOPN Inject 15 Units into the skin daily.   Yes [provider]  loperamide (IMODIUM) 2 MG capsule Take 2 mg by mouth as needed for diarrhea or loose stools.   Yes [provider]  metoprolol succinate (TOPROL-XL) 100 MG 24 hr tablet Take 1 tablet (100 mg total) by mouth daily. Patient taking differently: Take 100 mg by mouth every evening. 07/24/17  Yes Conrad Lonoke, MD  torsemide (DEMADEX) 20 MG tablet Take 40 mg by mouth daily. 08/27/19  Yes [provider]  venlafaxine XR (EFFEXOR-XR) 150 MG 24 hr capsule Take 150 mg by mouth daily.    Yes [provider]  methocarbamol (ROBAXIN) 500 MG tablet Take 1  tablet (500 mg total) by mouth every 6 (six) hours as needed for muscle spasms. Patient not taking: Reported on 04/16/2020 09/13/19   Shelly Coss, MD    Physical Exam: Vitals:   07/02/20 1532 07/02/20 1839 07/02/20 2145 07/03/20 0000  BP: 114/76 (!) 181/63 (!) 184/74 (!) 166/59  Pulse: 80 87 72 83  Resp: (!) 21 15 17 14   Temp:  98.3 F (36.8 C) 98.1 F (36.7 C)   TempSrc:  Oral Oral   SpO2: 90% 96% 96% 94%  Weight:      Height:  Constitutional: Lethargic arousable and oriented x3, no associated distress.   Skin: Notable ulceration over the right upper extremity AV graft.  Otherwise, no rashes, no lesions, good skin turgor noted. Eyes: Pupils are equally reactive to light.  No evidence of scleral icterus or conjunctival pallor.  ENMT: Moist mucous membranes noted.  Posterior pharynx clear of any exudate or lesions.   Neck: normal, supple, no masses, no thyromegaly.  No evidence of jugular venous distension.   Respiratory: Coarse breath sounds bilaterally with diffuse rales and associated wheezing.  Normal respiratory effort. No accessory muscle use.  Cardiovascular: Regular rate and rhythm, no murmurs / rubs / gallops. No extremity edema. 2+ pedal pulses. No carotid bruits.  Chest:   Right internal jugular tunneled hemodialysis catheter noted.  Nontender without crepitus or deformity.   Back:   Nontender without crepitus or deformity. Abdomen: Abdomen is soft and nontender.  No evidence of intra-abdominal masses.  Positive bowel sounds noted in all quadrants.   Musculoskeletal: Ulceration over the right upper extremity graft with mild redness and tenderness on palpation.  No joint deformity upper and lower extremities. Good ROM, no contractures. Normal muscle tone.  Neurologic: CN 2-12 grossly intact. Sensation intact.  Patient moving all 4 extremities spontaneously.  Patient is following all commands.  Patient is responsive to verbal stimuli.   Psychiatric: Patient exhibits  depressed mood with flat affect.   Patient seems to possess insight as to their current situation.     Labs on Admission: I have personally reviewed following labs and imaging studies -   CBC: Recent Labs  Lab 07/02/20 2059  WBC 10.3  NEUTROABS 6.9  HGB 9.1*  HCT 29.7*  MCV 94.3  PLT 993   Basic Metabolic Panel: Recent Labs  Lab 07/02/20 2059  NA 136  K 4.3  CL 101  CO2 21*  GLUCOSE 102*  BUN 44*  CREATININE 6.19*  CALCIUM 8.6*   GFR: Estimated Creatinine Clearance: 9.5 mL/min (A) (by C-G formula based on SCr of 6.19 mg/dL (H)). Liver Function Tests: No results for input(s): AST, ALT, ALKPHOS, BILITOT, PROT, ALBUMIN in the last 168 hours. No results for input(s): LIPASE, AMYLASE in the last 168 hours. No results for input(s): AMMONIA in the last 168 hours. Coagulation Profile: No results for input(s): INR, PROTIME in the last 168 hours. Cardiac Enzymes: No results for input(s): CKTOTAL, CKMB, CKMBINDEX, TROPONINI in the last 168 hours. BNP (last 3 results) No results for input(s): PROBNP in the last 8760 hours. HbA1C: No results for input(s): HGBA1C in the last 72 hours. CBG: No results for input(s): GLUCAP in the last 168 hours. Lipid Profile: No results for input(s): CHOL, HDL, LDLCALC, TRIG, CHOLHDL, LDLDIRECT in the last 72 hours. Thyroid Function Tests: No results for input(s): TSH, T4TOTAL, FREET4, T3FREE, THYROIDAB in the last 72 hours. Anemia Panel: No results for input(s): VITAMINB12, FOLATE, FERRITIN, TIBC, IRON, RETICCTPCT in the last 72 hours. Urine analysis:    Component Value Date/Time   COLORURINE YELLOW 01/10/2017 1150   APPEARANCEUR CLOUDY (A) 01/10/2017 1150   LABSPEC 1.011 01/10/2017 1150   PHURINE 7.0 01/10/2017 1150   GLUCOSEU NEGATIVE 01/10/2017 1150   HGBUR NEGATIVE 01/10/2017 1150   BILIRUBINUR NEGATIVE 01/10/2017 1150   KETONESUR NEGATIVE 01/10/2017 1150   PROTEINUR 100 (A) 01/10/2017 1150   UROBILINOGEN 0.2 04/25/2014 2315    NITRITE NEGATIVE 01/10/2017 1150   LEUKOCYTESUR MODERATE (A) 01/10/2017 1150    Radiological Exams on Admission - Personally Reviewed: DG Chest Portable 1  View  Result Date: 07/02/2020 CLINICAL DATA:  Cough EXAM: PORTABLE CHEST 1 VIEW COMPARISON:  04/16/2020 FINDINGS: Right-sided central venous catheter tip at the cavoatrial region. Electronic recording device over left chest. Cardiomegaly with probable small left effusion. Mild central congestion. Aortic atherosclerosis. No pneumothorax. Upper extremity vascular stents. IMPRESSION: 1. Right-sided central venous catheter tip over the cavoatrial region 2. Cardiomegaly with slight central congestion and probable small left effusion Electronically Signed   By: Donavan Foil M.D.   On: 07/02/2020 16:40    EKG: Personally reviewed.  Rhythm is normal sinus rhythm with heart rate of 92 bpm.  No dynamic ST segment changes appreciated.  Assessment/Plan Principal Problem:   Hemodialysis catheter dysfunction, initial encounter [facility])   Patient presenting with progressively worsening malaise weakness and shortness of breath in the setting of a nonfunctioning hemodialysis catheter and inability to complete her full dialysis session on 5/3.  Patient is already been evaluated by vascular surgery and plan is for patient to go for tunneled catheter replacement in the OR on 5/5  N.p.o. after midnight  We will formally consult nephrology on day shift so that patient can promptly undergo evaluation for dialysis after the catheter is placed.  Based on my evaluation of the patient there is no indication for emergent dialysis at this point.  Active Problems:  Ulceration of right upper extremity AV graft   There is a notable ulceration directly over the patient's right upper extremity AV graft that is somewhat tender and red on exam  There are reports of small amounts of purulent drainage prior to arrival but I did not observe any of this on my  examination.  No obvious systemic evidence of infection  Patient to undergo partial excision of this AV graft in the OR with vascular surgery on 5/5.    Generalized anxiety disorder   Continue home regimen of anxiolytics and psychotropic agents  Acute on chronic diastolic CHF (congestive heart failure) ([place])   Patient is exhibiting coarse breath sounds with rales and wheezing on lung exam in the setting of a several day history of progressively worsening shortness of breath  Chest x-ray reveals some evidence of pulmonary edema  Patient is presumably suffering from mild acute on chronic diastolic congestive heart failure due to ineffective dialysis  We will coordinate with nephrology on day shift tomorrow morning for prompt evaluation for resumption of dialysis after the tunneled hemodialysis catheter is replaced    Type 2 diabetes mellitus with chronic kidney disease on chronic dialysis, with long-term current use of insulin ([place])   Accu-Cheks before every meal and nightly with sliding scale insulin  Resume home regimen of basal insulin therapy  Hemoglobin A1c pending    ESRD (end stage renal disease) on dialysis [facility])   Will contact nephrology for consultation tomorrow morning for formal evaluation and resumption of dialysis after tunneled dialysis catheter was replaced  No indication for emergent dialysis overnight    Mixed diabetic hyperlipidemia associated with type 2 diabetes mellitus ([place])   Continue home regimen of statin therapy  Essential hypertension   Resuming home regimen of antihypertensive therapy  Code Status:  Full code Family Communication: Deferred  Status is: Observation  The patient remains OBS appropriate and will d/c before 2 midnights.  Dispo: The patient is from: Home              Anticipated d/c is to: Home              Patient currently is not  medically stable to d/c.   Difficult to place patient No        Vernelle Emerald  MD [facility] Pager [phone]  If 7PM-7AM, please contact night-coverage [email or web address] Use universal [place] password for that web site. If you do not have the password, please call the hospital operator.  07/03/2020, 12:06 AM

## 2020-07-03 NOTE — Anesthesia Preprocedure Evaluation (Addendum)
Anesthesia Evaluation  Patient identified by MRN, date of birth, ID band Patient awake    Reviewed: Allergy & Precautions, NPO status , [name] Chart, lab work & pertinent test results  Airway Mallampati: III  TM Distance: <3 FB Neck ROM: Full    Dental no notable dental hx.    Pulmonary shortness of breath, former smoker,    + rhonchi  + decreased breath sounds      Cardiovascular hypertension, + Peripheral Vascular Disease and +CHF  + dysrhythmias + pacemaker  Rhythm:Regular Rate:Normal + Systolic murmurs    Neuro/Psych TIACVA, Residual Symptoms negative psych ROS   GI/Hepatic negative GI ROS, Neg liver ROS,   Endo/Other  diabetes  Renal/GU DialysisRenal disease  negative genitourinary   Musculoskeletal negative musculoskeletal ROS (+)   Abdominal   Peds negative pediatric ROS (+)  Hematology  (+) anemia ,   Anesthesia Other Findings   Reproductive/Obstetrics negative OB ROS                            Anesthesia Physical Anesthesia Plan  ASA: IV  Anesthesia Plan: General   Post-op Pain Management:    Induction: Intravenous  PONV Risk Score and Plan: 3 and Ondansetron, Dexamethasone and Treatment may vary due to age or medical condition  Airway Management Planned: LMA  Additional Equipment:   Intra-op Plan:   Post-operative Plan: Extubation in OR  Informed Consent: I have reviewed the patients History and Physical, chart, labs and discussed the procedure including the risks, benefits and alternatives for the proposed anesthesia with the patient or authorized representative who has indicated his/her understanding and acceptance.     Dental advisory given  Plan Discussed with: CRNA and Surgeon  Anesthesia Plan Comments:         Anesthesia Quick Evaluation

## 2020-07-03 NOTE — Discharge Instructions (Signed)
Incision Care, Adult An incision is a surgical cut that is made through your skin. Most incisions are closed after a surgical procedure. Your incision may be closed with stitches (sutures), staples, skin glue, or adhesive strips. You may need to return to your health care provider to have sutures or staples removed. This may occur several days or several weeks after your surgery. Until then, the incision needs to be cared for properly to prevent infection. Follow instructions from your health care provider about how to care for your incision. Supplies needed:  Soap, water, and a clean hand towel.  Wound cleanser.  A clean bandage (dressing), if needed.  Cream or ointment, if told by your health care provider.  Clean gauze. How to care for your incision Cleaning the incision Ask your health care provider how to clean the incision. This may include:  Using mild soap and water, or wound cleanser.  Using a clean gauze to pat the incision dry after cleaning it. Dressing changes  Wash your hands with soap and water for at least 20 seconds before and after you change the dressing. If soap and water are not available, use hand sanitizer.  Change your dressing as told by your health care provider.  Leave sutures, staples, skin glue, or adhesive strips in place. These skin closures may need to stay in place for 2 weeks or longer. If adhesive strip edges start to loosen and curl up, you may trim the loose edges. Do not remove adhesive strips completely unless your health care provider tells you to do that.  Apply cream or ointment. Do this only as told by your health care provider.  Cover the incision with a clean dressing. Ask your health care provider when you can begin leaving the incision uncovered. Checking for infection Check your incision area every day for signs of infection. Check for:  More redness, swelling, or pain.  More fluid or blood.  Warmth.  Pus or a bad smell.    Follow these instructions at home Medicines  Take over-the-counter and prescription medicines only as told by your health care provider.  If you were prescribed an antibiotic medicine, cream, or ointment, take or apply it as told by your health care provider. Do not stop using the antibiotic even if your condition improves. Eating and drinking  Eat a diet that includes protein, vitamin A, vitamin C, and other nutrient-rich foods to help the wound heal. ? Foods rich in protein include meat, fish, eggs, dairy, beans, and nuts. ? Foods rich in vitamin A include carrots and dark green, leafy vegetables. ? Foods rich in vitamin C include citrus fruits, tomatoes, broccoli, and peppers.  Drink enough fluid to keep your urine pale yellow. General instructions  Do not take baths, swim, use a hot tub, or do anything that would put the incision underwater until your health care provider approves. Ask your health care provider if you may take showers. You may only be allowed to take sponge baths.  Limit movement around your incision to promote healing. ? Avoid straining, lifting, or exercising for the first 2 weeks after your procedure, or for as long as told by your health care provider. ? Return to your normal activities as told by your health care provider. Ask your health care provider what activities are safe for you.  Do not scratch or pick at the incision. Keep it covered as told by your health care provider.  Protect your incision from the sun when you are   outside for the first 6 months, or for as long as told by your health care provider. Cover up the scar area or apply sunscreen that has an SPF of at least 30.  Do not use any products that contain nicotine or tobacco, such as cigarettes, e-cigarettes, and chewing tobacco. These can delay incision healing after surgery. If you need help quitting, ask your health care provider.  Keep all follow-up visits as told by your health care  provider. This is important.   Contact a health care provider if:  You have any of these signs of infection: ? More redness, swelling, or pain around your incision. ? More fluid or blood coming from your incision. ? Warmth coming from your incision. ? Pus or a bad smell coming from your incision. ? A fever.  You are nauseous or you vomit.  You are dizzy.  Your sutures, staples, skin glue, or adhesive strips come undone. Get help right away if:  You have a red streak on the skin near your incision.  Your incision bleeds through the dressing and the bleeding does not stop with gentle pressure.  The edges of your incision open up and separate.  You have signs of a serious bodily reaction to an infection. These signs may include: ? Fever, shaking chills, or feeling very cold. ? Confusion or anxiety. ? Severe pain. ? Trouble breathing. ? Fast heartbeat. ? Clammy or sweaty skin. ? A rash. These symptoms may represent a serious problem that is an emergency. Do not wait to see if the symptoms will go away. Get medical help right away. Call your local emergency services (911 in the [place] Do not drive yourself to the hospital. Summary  Follow instructions from your health care provider about how to care for your incision.  Wash your hands with soap and water for at least 20 seconds before and after you change the dressing. If soap and water are not available, use hand sanitizer.  Check your incision area every day for signs of infection.  Keep all follow-up visits as told by your health care provider. This is important. This information is not intended to replace advice given to you by your health care provider. Make sure you discuss any questions you have with your health care provider. Document Revised: 12/06/2018 Document Reviewed: 12/06/2018 Elsevier Patient Education  2021 Elsevier Inc.   

## 2020-07-03 NOTE — TOC Initial Note (Signed)
Transition of Care [facility]) - Initial/Assessment Note    Patient Details  Name: Kelli [name] MRN: [id number] Date of Birth: 06-16-1950  Transition of [facility]) CM/SW Contact:    Verdell Carmine, RN Phone Number: 07/03/2020, 11:39 AM  Clinical Narrative:                 Admitted with clotted Hemodiaylsis catheter, also a infected AV graft. Unsure if she received full diayolsis treatment yesterday. To the OR for declot, and graft revision or take out. May need a new gradt placed.  Has husband at home. Goes to hemodialysis 3 times a week. Alerted renal case manager to admission [place] .   Expected Discharge Plan: Home/Self Care Barriers to Discharge: Continued Medical Work up   Patient Goals and CMS Choice        Expected Discharge Plan and Services Expected Discharge Plan: Home/Self Care   Discharge Planning Services: CM Consult   Living arrangements for the past 2 months: Single Family Home                                      Prior Living Arrangements/Services Living arrangements for the past 2 months: Single Family Home Lives with:: Spouse Patient language and need for interpreter reviewed:: Yes        Need for Family Participation in Patient Care: Yes (Comment) Care giver support system in place?: Yes (comment)   Criminal Activity/Legal Involvement Pertinent to Current Situation/Hospitalization: No - Comment as needed  Activities of Daily Living Home Assistive Devices/Equipment: Bedside commode/3-in-1,CBG Meter ADL Screening (condition at time of admission) [name] cognitive ability adequate to safely complete daily activities?: No Is the patient deaf or have difficulty hearing?: No Does the patient have difficulty seeing, even when wearing glasses/contacts?: No Does the patient have difficulty concentrating, remembering, or making decisions?: Yes Patient able to express need for assistance with ADLs?: Yes Does the patient have difficulty dressing or  bathing?: Yes Independently performs ADLs?: No Communication: Independent Dressing (OT): Needs assistance Is this a change from baseline?: Pre-admission baseline Grooming: Needs assistance Is this a change from baseline?: Pre-admission baseline Feeding: Independent Bathing: Needs assistance Is this a change from baseline?: Pre-admission baseline Toileting: Independent In/Out Bed: Independent Walks in Home: Independent Does the patient have difficulty walking or climbing stairs?: Yes Weakness of Legs: Both Weakness of Arms/Hands: Left  Permission Sought/Granted                  Emotional Assessment       Orientation: : Oriented to  Time,Oriented to Place,Oriented to Self Alcohol / Substance Use: Not Applicable Psych Involvement: No (comment)  Admission diagnosis:  ESRD (end stage renal disease) ([place]) [Y18.5] Complication of vascular access for dialysis, initial encounter [T82.9XXA] Hemodialysis catheter dysfunction, initial encounter [facility]) [T82.41XA] Patient Active Problem List   Diagnosis Date Noted  . Essential hypertension 07/03/2020  . Hemodialysis catheter dysfunction, initial encounter ([place]) 07/02/2020  . Mixed diabetic hyperlipidemia associated with type 2 diabetes mellitus ([place]) 07/02/2020  . Ischemic chest pain ([place]) 09/11/2019  . Chest pain 09/11/2019  . Occult GI bleeding 01/28/2017  . TIA (transient ischemic attack) 01/28/2017  . Congestive heart failure ([place])   . ESRD on hemodialysis ([place])   . Pressure injury of skin 01/23/2017  . ESRD (end stage renal disease) on dialysis ([place])   . Gastritis and duodenitis   . Acute metabolic encephalopathy 63/14/9702  .  Palliative care encounter   . Goals of care, counseling/discussion   . DNR (do not resuscitate) discussion   . Acute renal failure superimposed on stage 4 chronic kidney disease ([place]) 01/09/2017  . Precordial pain   . CKD (chronic kidney disease), stage IV ([place]) 01/07/2017  . Acute respiratory  failure with hypoxia ([place]) 01/07/2017  . Anemia 01/06/2017  . Type 2 diabetes mellitus with chronic kidney disease on chronic dialysis, with long-term current use of insulin ([place]) 01/06/2017  . B12 deficiency 11/18/2016  . Anemia in chronic kidney disease 11/18/2016  . PUD (peptic ulcer disease) 10/13/2016  . Pacemaker 10/13/2016  . Acute on chronic diastolic CHF (congestive heart failure) ([place]) 10/13/2016  . Elevated troponin   . Hypovolemic shock ([place]) 04/26/2014  . Upper GI bleed   . Leukocytosis, unspecified 07/30/2013  . Thrombocytosis 07/30/2013  . Leukocytosis 07/30/2013  . Subluxation of left shoulder joint 07/27/2013  . Closed dislocation of left humerus 07/26/2013  . Normocytic anemia 07/25/2013  . Spastic hemiplegia affecting nondominant side ([place]) 07/06/2013  . 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 Alterations of sensations, late effect of cerebrovascular disease 07/06/2013  . UTI (urinary tract infection) 06/15/2013  . Edema 05/28/2013  . Knee pain 05/28/2013  . Carotid stenosis, bilateral 05/22/2013  . Fall at nursing home 05/20/2013  . Hip pain 05/20/2013  . Pain in joint, lower leg 05/19/2013  . Chest congestion 05/12/2013  . E. coli UTI 05/07/2013  . Left shoulder pain due to subluxation and/or adhesive capsulitis.  05/07/2013  . Pain in left shoulder 05/07/2013  . Acute CVA (cerebrovascular accident): R PCA infarct per MRI 04/13/13 04/13/2013  . Cerebral infarction ([place]) 04/13/2013  . CVA (cerebral infarction) 04/08/2013  . Acute ischemic stroke ([place]) 04/08/2013  . Diplopia 04/08/2013  . Left-sided weakness 04/08/2013  . Vertigo 04/08/2013  . Paresthesias in left hand 04/08/2013  . Cerebellar stroke ([place]) 04/08/2013  . Dizziness and giddiness 04/08/2013  . Stroke ([place]) 04/08/2013  . Depression 04/06/2013  . Peripheral arterial disease ([place]) 03/16/2013  . History of stroke 01/30/2013  . CKD (chronic kidney  disease) stage 3, GFR 30-59 ml/min (HCC) 01/16/2013  . Generalized anxiety disorder 12/08/2012  . Lumbago 12/08/2012  . Urinary frequency 11/14/2012  . Occlusion and stenosis of carotid artery with cerebral infarction 10/05/2012  . Cerebral infarction due to unspecified occlusion or stenosis of unspecified carotid arteries 10/05/2012  . Tobacco abuse 10/04/2012  . Obesity 10/04/2012   PCP:  Manon Hilding, MD Pharmacy:   [facility] [place] [address] [place] [address] Phone: [phone] Fax: [phone]     Social Determinants of Health (SDOH) Interventions    Readmission Risk Interventions No flowsheet data found.

## 2020-07-03 NOTE — Transfer of Care (Signed)
Immediate Anesthesia Transfer of Care Note  Patient: Kelli [name]  Procedure(s) Performed: Removal of right upper arm Gortex graft (Right Arm Upper) INSERTION OF DIALYSIS CATHETER and exchange of right internal jugular dialysis catheter (Right Neck)  Patient Location: PACU  Anesthesia Type:General  Level of Consciousness: drowsy and patient cooperative  Airway & Oxygen Therapy: Patient Spontanous Breathing  Post-op Assessment: Report given to RN and Post -op Vital signs reviewed and stable  Post vital signs: Reviewed and stable  Last Vitals:  Vitals Value Taken Time  BP 101/62 07/03/20 1019  Temp    Pulse 77 07/03/20 1024  Resp 18 07/03/20 1024  SpO2 97 % 07/03/20 1024  Vitals shown include unvalidated device data.  Last Pain:  Vitals:   07/03/20 0730  TempSrc: Oral  PainSc:          Complications: No complications documented.

## 2020-07-03 NOTE — Progress Notes (Signed)
Pt arrived back to unit from PACU. Drsg to R IJ oozing blood and saturated. PACU RN and myself removed drsg and reapplied guaze drsg to site. No c/o pain. Pt drowsy. Given water and crackers. Husband present in rm and Dr. Johnette Abraham notified that pt was back on the unit. Will cont to monitor.

## 2020-07-03 NOTE — Op Note (Signed)
Patient name: Kelli [name] MRN: [id number] DOB: November 17, 1950 Sex: female  07/03/2020 Pre-operative Diagnosis: End-stage renal disease Post-operative diagnosis:  Same Surgeon:  Erlene Quan C. Donzetta Matters, MD Assistant: Arlee Muslim, PA Procedure Performed: 1.  Replacement of right IJ 19 cm tunneled dialysis catheter with fluoroscopic guidance 2.  Excision of right upper arm AV graft and stent   Indications: 70 year old female presented with ulceration of her right upper arm at the site of her previous graft.  It was not grossly infected.  She also has a malfunctioning tunneled dialysis catheter which she has been using for dialysis.  She is now indicated for replacement of the catheter and excision of the graft.  An assistant was necessary to expedite the case.  Findings: The existing catheter was replaced by cutting down on the neck incision transecting the catheter passed a wire centrally which did pass easily.  We then tunneled a new 19 cm catheter from a separate tunnel incision.  The concerning portion of the graft did have an underlying covered stent.  Towards the antecubitum the graft was chronically thrombosed this was transected and was well incorporated.  Towards the axilla we did excise the stent entirely pulled tension on the graft and tied it off and excised this out of the wound bed and the wound was closed with staples primarily.   Procedure:  The patient was identified in the holding area and taken to the operating room where she is placed supine on upper table and LMA anesthesia induced.  She was sterilely prepped and draped initially in the right neck and chest in usual fashion, antibiotics were minister timeout was called.  I used ultrasound to identify the internal jugular vein but this was quite diminutive there was a catheter in place.  I then opened the previous neck incision putting down onto the catheter.  The catheter was clamped and transected.  The access end of the catheter was  then removed and passed off the table.  A stab incision was made a new 19 cm tunneled dialysis catheter was passed.  Wire was passed centrally through the existing catheter the existing catheter was removed and the internal jugular vein and introducer sheath was placed with fluoroscopic guidance.  The new catheter was placed to the SVC atrial junction.  This was flushed with heparinized saline.  Was affixed to the skin with 3-0 nylon suture.  I then closed both the previous catheter site as well as the neck incision with 4-0 Monocryl and Dermabond placed to level the skin.  The catheter was left 1.6 cc of concentrated heparin.  Attention was then turned to the right upper extremity.  This was sterilely prepped and draped in the usual fashion and a timeout was called.  Antibiotics were up-to-date.  Ultrasound was used to identify what appeared to be a stent in the existing graft.  Elliptical type incision was made we dissected down onto the graft.  Towards the antecubitum the graft was entirely incorporated.  We clamped this and transected it.  It appeared to be chronically thrombosed I did not tie it off and I did chop it as far back towards the antecubitum as I could and this was completely out of the wound bed.  I then transected the graft after clamping it higher up.  There was a stent.  I was able to remove the entire stent from the graft.  I pulled tension on the graft it was not entirely incorporated but I was able to  tie it off high up on the arm and when tension was removed it was several centimeters out of the wound bed.  I thoroughly irrigated the wound.  I then primarily closed with staples.  Sterile dressing was placed.  She was awakened from anesthesia having tolerated seizure without any complication.  Counts were correct at completion.  EBL: 20 cc    Zoeie Ritter C. Donzetta Matters, MD [facility] Office: [phone] Pager: [phone]

## 2020-07-03 NOTE — Anesthesia Postprocedure Evaluation (Signed)
Anesthesia Post Note  Patient: ANAAYA FUSTER  Procedure(s) Performed: Removal of right upper arm Gortex graft (Right Arm Upper) INSERTION OF DIALYSIS CATHETER and exchange of right internal jugular dialysis catheter (Right Neck)     Patient location during evaluation: PACU Anesthesia Type: General Level of consciousness: awake and alert Pain management: pain level controlled Vital Signs Assessment: post-procedure vital signs reviewed and stable Respiratory status: spontaneous breathing, nonlabored ventilation, respiratory function stable and patient connected to nasal cannula oxygen Cardiovascular status: blood pressure returned to baseline and stable Postop Assessment: no apparent nausea or vomiting Anesthetic complications: no   No complications documented.  Last Vitals:  Vitals:   07/03/20 1105 07/03/20 1126  BP: 103/69 106/62  Pulse: 73 73  Resp: 14 18  Temp: 36.9 C 36.6 C  SpO2: 100% 92%    Last Pain:  Vitals:   07/03/20 1126  TempSrc: Oral  PainSc:                  Dell Briner S

## 2020-07-03 NOTE — Interval H&P Note (Signed)
History and Physical Interval Note:  07/03/2020 8:22 AM  Kelli [name]  has presented today for surgery, with the diagnosis of Infected AVG.  The various methods of treatment have been discussed with the patient and family. After consideration of risks, benefits and other options for treatment, the patient has consented to  Procedure(s): REDO ARTERIOVENOUS (AV) FISTULA Excision.  Replacement TDC (Left) INSERTION OF DIALYSIS CATHETER as a surgical intervention.  The patient's history has been reviewed, patient examined, no change in status, stable for surgery.  I have reviewed the patient's chart and labs.  Questions were answered to the patient's satisfaction.     Servando Snare

## 2020-07-03 NOTE — Progress Notes (Signed)
Renal Navigator provided update to patient's outpatient HD clinic/[facility] [place] by faxing H&P and VVS OR note to provide continuity of care. Navigator called to request clinic fax outpatient orders, hepB status and home meds to inpt HD unit.  Alphonzo Cruise, [place] Renal Navigator [phone]

## 2020-07-04 ENCOUNTER — Encounter (HOSPITAL_COMMUNITY): Payer: Self-pay | Admitting: Vascular Surgery

## 2020-07-04 DIAGNOSIS — N186 End stage renal disease: Secondary | ICD-10-CM | POA: Diagnosis not present

## 2020-07-04 DIAGNOSIS — I5033 Acute on chronic diastolic (congestive) heart failure: Secondary | ICD-10-CM | POA: Diagnosis not present

## 2020-07-04 DIAGNOSIS — R4182 Altered mental status, unspecified: Secondary | ICD-10-CM | POA: Diagnosis not present

## 2020-07-04 DIAGNOSIS — T8241XA Breakdown (mechanical) of vascular dialysis catheter, initial encounter: Secondary | ICD-10-CM | POA: Diagnosis not present

## 2020-07-04 LAB — CBC
HCT: 26.3 % — ABNORMAL LOW (ref 36.0–46.0)
Hemoglobin: 8.8 g/dL — ABNORMAL LOW (ref 12.0–15.0)
MCH: 29.1 pg (ref 26.0–34.0)
MCHC: 33.5 g/dL (ref 30.0–36.0)
MCV: 87.1 fL (ref 80.0–100.0)
Platelets: UNDETERMINED 10*3/uL (ref 150–400)
RBC: 3.02 MIL/uL — ABNORMAL LOW (ref 3.87–5.11)
RDW: 14.6 % (ref 11.5–15.5)
WBC: 10.3 10*3/uL (ref 4.0–10.5)
nRBC: 0 % (ref 0.0–0.2)

## 2020-07-04 LAB — GLUCOSE, CAPILLARY
Glucose-Capillary: 138 mg/dL — ABNORMAL HIGH (ref 70–99)
Glucose-Capillary: 107 mg/dL — ABNORMAL HIGH (ref 70–99)
Glucose-Capillary: 111 mg/dL — ABNORMAL HIGH (ref 70–99)
Glucose-Capillary: 139 mg/dL — ABNORMAL HIGH (ref 70–99)

## 2020-07-04 LAB — BASIC METABOLIC PANEL
Anion gap: 10 (ref 5–15)
BUN: 16 mg/dL (ref 8–23)
CO2: 26 mmol/L (ref 22–32)
Calcium: 8.2 mg/dL — ABNORMAL LOW (ref 8.9–10.3)
Chloride: 98 mmol/L (ref 98–111)
Creatinine, Ser: 3.5 mg/dL — ABNORMAL HIGH (ref 0.44–1.00)
GFR, Estimated: 14 mL/min — ABNORMAL LOW (ref >60)
Glucose, Bld: 106 mg/dL — ABNORMAL HIGH (ref 70–99)
Potassium: 4 mmol/L (ref 3.5–5.1)
Sodium: 134 mmol/L — ABNORMAL LOW (ref 135–145)

## 2020-07-04 MED ORDER — HEPARIN SODIUM (PORCINE) 5000 UNIT/ML IJ SOLN
5000.0000 [IU] | Freq: Three times a day (TID) | INTRAMUSCULAR | Status: DC
  Administered 2020-07-04 – 2020-07-05 (×3): 5000 [IU] via SUBCUTANEOUS
  Filled 2020-07-04 (×2): qty 1

## 2020-07-04 NOTE — Progress Notes (Addendum)
[facility]  Subjective  - very sleepy.   Objective 118/72 86 98.2 F (36.8 C) (Oral) 18 97%  Intake/Output Summary (Last 24 hours) at 07/04/2020 0727 Last data filed at 07/03/2020 1805 Gross per 24 hour  Intake 250 ml  Output 626 ml  Net -376 ml    Right UE dressing clean and dry Will maintain dressing Right TDC in place Lungs non labored breathing  Assessment/Planning: POD # 1  1.  Replacement of right IJ 19 cm tunneled dialysis catheter with fluoroscopic guidance 2.  Excision of right upper arm AV graft and stent F/U for incision check in 2-3 weeks.   She may need to consider thigh great access  Roxy Horseman 07/04/2020 7:27 AM --  Laboratory Lab Results: Recent Labs    07/03/20 0539 07/04/20 0211  WBC 11.0* 10.3  HGB 9.6* 8.8*  HCT 30.5* 26.3*  PLT 243 PLATELET CLUMPS NOTED ON SMEAR, UNABLE TO ESTIMATE   BMET Recent Labs    07/03/20 0539 07/04/20 0211  NA 138 134*  K 4.2 4.0  CL 101 98  CO2 23 26  GLUCOSE 138* 106*  BUN 49* 16  CREATININE 6.63* 3.50*  CALCIUM 8.7* 8.2*    COAG Lab Results  Component Value Date   INR 1.05 01/13/2017   INR 0.97 04/08/2013   INR 1.00 10/06/2012   No results found for: PTT  VASCULAR STAFF ADDENDUM: I have independently interviewed and examined the patient. I agree with the above.  Stable for discharge. Continue local care to the left arm - daily dry dressing to surgical site.  HD via RIJ TDC. Call for questions. Dr. Oneida Alar on call this weekend.   Yevonne Aline. Stanford Breed, MD [facility] Phone Number: [phone] 07/04/2020 9:33 AM

## 2020-07-04 NOTE — Evaluation (Signed)
Occupational Therapy Evaluation Patient Details Name: Kelli [name] MRN: [id number] DOB: September 11, 1950 Today's Date: 07/04/2020    History of Present Illness Pt is a 70 y/o female admitted 5/4 secondary to HD catheter malfunction. Pt is s/p R IJ tunneled catheter placement and excision of R Upper arm AV graft on 5/5. PMH includes CVA with L sided deficits, HTN, CKD, fibromyalgia, dCHF, ESRD on HD, and DM.   Clinical Impression   Pt admitted with the above, and demonstrates the below listed deficit which are chronic in nature.  Pt lives with her spouse and required total A for ADLs with exception of self feeding and requires max A for functional transfers to w/c.  Pt's spouse present during eval and feels she is at, or close to baseline.  He does plan to take her home.  Recommend use of hoyer lift, but he reports this won't fit in their house. He has all other needed DME.  OT will sign off at this time.     Follow Up Recommendations  No OT follow up;Supervision/Assistance - 24 hour (spouse deferred [place] as pt does not tend to participate well and he doesn't think she will progress)    Equipment Recommendations  None recommended by OT (recommend hoyer lift, but spouse reports it won't fit in their home)    Recommendations for Other Services       Precautions / Restrictions Precautions Precautions: Fall Restrictions Weight Bearing Restrictions: No      Mobility Bed Mobility Overal bed mobility: Needs Assistance Bed Mobility: Supine to Sit;Sit to Supine     Supine to sit: +2 for physical assistance;Max assist Sit to supine: +2 for physical assistance;Max assist   General bed mobility comments: Max +2 for trunk and LE assist throughout bed mobility. Pt with poor sitting balance and demonstrating R lateral lean in sitting.    Transfers Overall transfer level: Needs assistance Equipment used: 2 person hand held assist Transfers: Sit to/from Stand Sit to Stand: Max assist;+2 physical  assistance         General transfer comment: Max +2 for lift assist and steadying to stand. Pt only able to stand for short duration prior to return to sitting.    Balance Overall balance assessment: Needs assistance Sitting-balance support: Feet supported;Bilateral upper extremity supported Sitting balance-Leahy Scale: Poor Sitting balance - Comments: R lateral lean in sitting. Reliant on min to mod A to maintain sitting balance. Postural control: Posterior lean Standing balance support: Bilateral upper extremity supported Standing balance-Leahy Scale: Zero Standing balance comment: Max A +2 for standing balance                           ADL either performed or assessed with clinical judgement   ADL Overall ADL's : At baseline                                       General ADL Comments: spouse assists wtih all aspects of ADLs and feels she is at, or close to baseline     Vision Patient Visual Report: No change from baseline       Perception     Praxis      Pertinent Vitals/Pain Pain Assessment: Faces Faces Pain Scale: Hurts little more Pain Location: R arm Pain Descriptors / Indicators: Guarding;Grimacing Pain Intervention(s): Limited activity within patient's tolerance  Hand Dominance Right   Extremity/Trunk Assessment Upper Extremity Assessment Upper Extremity Assessment: LUE deficits/detail LUE Deficits / Details: residual weakness Lt UE with extensor spasticity which is baseline per spouse   Lower Extremity Assessment Lower Extremity Assessment: Defer to PT evaluation       Communication Communication Communication: Expressive difficulties (dysarthric)   Cognition Arousal/Alertness: Awake/alert Behavior During Therapy: Flat affect Overall Cognitive Status: History of cognitive impairments - at baseline                                 General Comments: Pt awake, but reports fatigue.  She and spouse report  she is at, or close to baseline   General Comments  Pt's spouse present.  He feels she is close to baseline.  Discussed possibility of [place] therapies, but he declined this stating he doesn't think she will improve much. We discussed use of good body mechanics.  He reports a [employer] fast streamer won't fit into their home.    Exercises     Shoulder Instructions      Home Living Family/patient expects to be discharged to:: Private residence Living Arrangements: Spouse/significant other;Children Available Help at Discharge: Family;Available 24 hours/day Type of Home: House Home Access: Ramped entrance     Home Layout: Two level;Able to live on main level with bedroom/bathroom Alternate Level Stairs-Number of Steps: flight Alternate Level Stairs-Rails: None Bathroom Shower/Tub: Occupational psychologist: Standard Bathroom Accessibility: Yes   Home Equipment: Shower seat;Wheelchair - manual;Hospital bed;Bedside commode;Other (comment)          Prior Functioning/Environment Level of Independence: Needs assistance  Gait / Transfers Assistance Needed: Pt's husband reports she requires alot of assist to transfer to/from [facility]. Uses WC for mobility. ADL's / Homemaking Assistance Needed: spouse assists with all aspects of bathing and dressing.  He is able to transfer her to shower chair for showers.  She is able to feed herself after set up   Comments: Spouse reports they utilize w/c transport service for transportation        OT Problem List: Decreased strength;Decreased range of motion;Decreased activity tolerance;Impaired balance (sitting and/or standing);Decreased coordination;Decreased safety awareness;Decreased cognition;Impaired UE functional use;Impaired tone;Pain      OT Treatment/Interventions:      OT Goals(Current goals can be found in the care plan section) Acute Rehab OT Goals Patient Stated Goal: to go home tomorrow OT Goal Formulation: All assessment and education complete,  DC therapy  OT Frequency:     Barriers to D/C:            Co-evaluation PT/OT/SLP Co-Evaluation/Treatment: Yes Reason for Co-Treatment: Complexity of the patient's impairments (multi-system involvement);Necessary to address cognition/behavior during functional activity;For patient/therapist safety PT goals addressed during session: Mobility/safety with mobility;Balance OT goals addressed during session: Strengthening/ROM;ADL's and self-care      AM-PAC OT "6 Clicks" Daily Activity     Outcome Measure Help from another person eating meals?: A Little Help from another person taking care of personal grooming?: A Lot Help from another person toileting, which includes using toliet, bedpan, or urinal?: Total Help from another person bathing (including washing, rinsing, drying)?: Total Help from another person to put on and taking off regular upper body clothing?: Total Help from another person to put on and taking off regular lower body clothing?: Total 6 Click Score: 9   End of Session Equipment Utilized During Treatment: Gait belt Nurse Communication: Mobility status  Activity  Tolerance: Patient limited by fatigue Patient left: in bed;with call bell/phone within reach;with bed alarm set;with family/visitor present  OT Visit Diagnosis: Unsteadiness on feet (R26.81);Cognitive communication deficit (R41.841);Hemiplegia and hemiparesis Symptoms and signs involving cognitive functions: Cerebral infarction Hemiplegia - Right/Left: Left Hemiplegia - dominant/non-dominant: Non-Dominant Hemiplegia - caused by: Cerebral infarction                Time: 9326-7124 OT Time Calculation (min): 27 min Charges:  OT General Charges $OT Visit: 1 Visit OT Evaluation $OT Eval Moderate Complexity: 1 Mod  Nilsa Nutting., OTR/L Acute Rehabilitation Services Pager [phone] Office [phone]   Lucille Passy M 07/04/2020, 5:13 PM

## 2020-07-04 NOTE — Evaluation (Signed)
Physical Therapy Evaluation Patient Details Name: Kelli [name] MRN: [id number] DOB: [birth year] Today's Date: 07/04/2020   History of Present Illness  Pt is a 70 y/o female admitted 5/4 secondary to HD catheter malfunction. Pt is s/p R IJ tunneled catheter placement and excision of R Upper arm AV graft on 5/5. PMH includes CVA with L sided deficits, HTN, CKD, fibromyalgia, dCHF, ESRD on HD, and DM.  Clinical Impression  Pt admitted secondary to problem above with deficits below. Pt very lethargic during session and only assisting minimally with bed mobility tasks. Requiring total A to sit at EOB. Upon sitting, pt with brief period of alertness, however, quickly fell back asleep. Given current deficits, feel pt may require SNF level therapies at d/c. However, should alertness improve and pt tolerates mobility well, feel she may be able to d/c home with caregiver support. Will continue to follow acutely to maximize functional mobility independence and safety.     Follow Up Recommendations SNF;Supervision for mobility/OOB (vs HHPT should lethargy improve)    Equipment Recommendations  Wheelchair cushion (measurements PT);Wheelchair (measurements PT)    Recommendations for Other Services       Precautions / Restrictions Precautions Precautions: Fall Restrictions Weight Bearing Restrictions: No      Mobility  Bed Mobility Overal bed mobility: Needs Assistance Bed Mobility: Supine to Sit;Sit to Supine     Supine to sit: Total assist Sit to supine: Total assist   General bed mobility comments: Total A for bed mobility tasks. Pt assisting very minimally secondary to lethargy. Pt with brief period of alertness upon sitting, however, quickly fell back to sleep.    Transfers                 General transfer comment: unable  Ambulation/Gait                Stairs            Wheelchair Mobility    Modified Rankin (Stroke Patients Only)       Balance Overall  balance assessment: Needs assistance Sitting-balance support: Feet supported;Bilateral upper extremity supported Sitting balance-Leahy Scale: Poor Sitting balance - Comments: Reliant on BUE and mod A for support                                     Pertinent Vitals/Pain Pain Assessment: Faces Faces Pain Scale: No hurt    Home Living Family/patient expects to be discharged to:: Private residence Living Arrangements: Spouse/significant other;Children Available Help at Discharge: Family Type of Home: House Home Access: Stairs to enter   [employer] of Steps: pt unable to report how many steps Home Layout: Two level Home Equipment: None      Prior Function Level of Independence: Independent         Comments: Pt reporting she was independent with mobility.     Hand Dominance        Extremity/Trunk Assessment   Upper Extremity Assessment Upper Extremity Assessment: Defer to OT evaluation    Lower Extremity Assessment Lower Extremity Assessment: LLE deficits/detail LLE Deficits / Details: LLE weakness at baseline    Cervical / Trunk Assessment Cervical / Trunk Assessment: Normal  Communication   Communication: Other (comment) (lethargy preventing some communication. Could answer some questions but not others.)  Cognition Arousal/Alertness: Lethargic Behavior During Therapy: Flat affect Overall Cognitive Status: Difficult to assess  General Comments: Pt very lethargic throughout session. Would wake up for brief period and quickly fall back asleep.      General Comments General comments (skin integrity, edema, etc.): No family present. Checked BP at end of session and BP at 93/52. RN notified.    Exercises     Assessment/Plan    PT Assessment Patient needs continued PT services  PT Problem List Decreased strength;Decreased balance;Decreased mobility;Decreased activity tolerance;Decreased  knowledge of use of DME;Decreased knowledge of precautions;Decreased cognition;Decreased safety awareness       PT Treatment Interventions DME instruction;Gait training;Therapeutic activities;Therapeutic exercise;Functional mobility training;Stair training;Balance training;Patient/family education    PT Goals (Current goals can be found in the Care Plan section)  Acute Rehab PT Goals PT Goal Formulation: Patient unable to participate in goal setting Time For Goal Achievement: 07/18/20 Potential to Achieve Goals: Fair    Frequency Min 2X/week   Barriers to discharge        Co-evaluation               AM-PAC PT "6 Clicks" Mobility  Outcome Measure Help needed turning from your back to your side while in a flat bed without using bedrails?: Total Help needed moving from lying on your back to sitting on the side of a flat bed without using bedrails?: Total Help needed moving to and from a bed to a chair (including a wheelchair)?: Total Help needed standing up from a chair using your arms (e.g., wheelchair or bedside chair)?: Total Help needed to walk in hospital room?: Total Help needed climbing 3-5 steps with a railing? : Total 6 Click Score: 6    End of Session   Activity Tolerance: Patient limited by lethargy Patient left: in bed;with call bell/phone within reach Nurse Communication: Mobility status PT Visit Diagnosis: Unsteadiness on feet (R26.81);Muscle weakness (generalized) (M62.81);Difficulty in walking, not elsewhere classified (R26.2)    Time: 4235-3614 PT Time Calculation (min) (ACUTE ONLY): 11 min   Charges:   PT Evaluation $PT Eval Moderate Complexity: 1 Mod          Reuel Derby, PT, DPT  Acute Rehabilitation Services  Pager: [phone] Office: [phone]   Rudean Hitt 07/04/2020, 9:53 AM

## 2020-07-04 NOTE — Progress Notes (Signed)
Physical Therapy Treatment Patient Details Name: Kelli [name] MRN: [id number] DOB: 1950/08/14 Today's Date: 07/04/2020    History of Present Illness Pt is a 70 y/o female admitted 5/4 secondary to HD catheter malfunction. Pt is s/p R IJ tunneled catheter placement and excision of R Upper arm AV graft on 5/5. PMH includes CVA with L sided deficits, HTN, CKD, fibromyalgia, dCHF, ESRD on HD, and DM.    PT Comments    Pt seen for second session per MD request. Improved alertness noted this session. Requiring max A +2 for bed mobility and transfers this session. Pt's husband present and clarified home set up and that pt was WC bound at baseline. Also reports she required a lot of assist prior to admission for transfers. Pt's husband requesting to take pt home and does not want SNF or HHPT as he reports pt does not participate well. Do feel they may benefit from a hoyer lift as pt's husband reports some back issues. Recommendations updated. Will continue to follow acutely.     Follow Up Recommendations  No PT follow up;Supervision/Assistance - 24 hour (Pt's husband refusing SNF and HHPT)     Equipment Recommendations  Other (comment) (hoyer lift)    Recommendations for Other Services       Precautions / Restrictions Precautions Precautions: Fall Restrictions Weight Bearing Restrictions: No    Mobility  Bed Mobility Overal bed mobility: Needs Assistance Bed Mobility: Supine to Sit;Sit to Supine     Supine to sit: +2 for physical assistance;Max assist Sit to supine: +2 for physical assistance;Max assist   General bed mobility comments: Max +2 for trunk and LE assist throughout bed mobility. Pt with poor sitting balance and demonstrating R lateral lean in sitting.    Transfers Overall transfer level: Needs assistance Equipment used: 2 person hand held assist Transfers: Sit to/from Stand Sit to Stand: Max assist;+2 physical assistance         General transfer comment: Max +2  for lift assist and steadying to stand. Pt only able to stand for short duration prior to return to sitting.  Ambulation/Gait             General Gait Details: Pt not ambulatory at baseline   Stairs             Wheelchair Mobility    Modified Rankin (Stroke Patients Only)       Balance Overall balance assessment: Needs assistance Sitting-balance support: Feet supported;Bilateral upper extremity supported Sitting balance-Leahy Scale: Poor Sitting balance - Comments: R lateral lean in sitting. Reliant on min to mod A to maintain sitting balance.   Standing balance support: Bilateral upper extremity supported Standing balance-Leahy Scale: Zero Standing balance comment: Max A +2 for standing balance                            Cognition Arousal/Alertness: Awake/alert Behavior During Therapy: Flat affect Overall Cognitive Status: History of cognitive impairments - at baseline                                 General Comments: Improved alertness noted this session. Pt reporting she [place] throughout session.      Exercises      General Comments General comments (skin integrity, edema, etc.): Pt's husband present during session. Reports he plans to take pt home.  Pertinent Vitals/Pain Pain Assessment: Faces Faces Pain Scale: Hurts little more Pain Location: R arm Pain Descriptors / Indicators: Guarding;Grimacing Pain Intervention(s): Limited activity within patient's tolerance;Monitored during session;Repositioned    Home Living Family/patient expects to be discharged to:: Private residence Living Arrangements: Spouse/significant other;Children Available Help at Discharge: Family;Available 24 hours/day Type of Home: House Home Access: Ramped entrance   Home Layout: Two level;Able to live on main level with bedroom/bathroom Home Equipment: Shower seat;Wheelchair - manual;Hospital bed;Bedside  commode;Other (comment) (hoyer sling; has gait belt)      Prior Function Level of Independence: Needs assistance  Gait / Transfers Assistance Needed: Pt's husband reports she requires alot of assist to transfer to/from [facility]. Uses WC for mobility. ADL's / Homemaking Assistance Needed: Uses handicap [employer] service to help with transportation.     PT Goals (current goals can now be found in the care plan section) Acute Rehab PT Goals Patient Stated Goal: to go home per husband PT Goal Formulation: With patient/family Time For Goal Achievement: 07/18/20 Potential to Achieve Goals: Fair Progress towards PT goals: Progressing toward goals    Frequency    Min 2X/week      PT Plan Discharge plan needs to be updated;Equipment recommendations need to be updated    Co-evaluation PT/OT/SLP Co-Evaluation/Treatment: Yes Reason for Co-Treatment: Necessary to address cognition/behavior during functional activity;For patient/therapist safety PT goals addressed during session: Mobility/safety with mobility;Balance        AM-PAC PT "6 Clicks" Mobility   Outcome Measure  Help needed turning from your back to your side while in a flat bed without using bedrails?: Total Help needed moving from lying on your back to sitting on the side of a flat bed without using bedrails?: Total Help needed moving to and from a bed to a chair (including a wheelchair)?: Total Help needed standing up from a chair using your arms (e.g., wheelchair or bedside chair)?: Total Help needed to walk in hospital room?: Total Help needed climbing 3-5 steps with a railing? : Total 6 Click Score: 6    End of Session Equipment Utilized During Treatment: Gait belt Activity Tolerance: Patient limited by fatigue Patient left: in bed;with call bell/phone within reach;with family/visitor present Nurse Communication: Mobility status PT Visit Diagnosis: Unsteadiness on feet (R26.81);Muscle weakness (generalized) (M62.81);Difficulty  in walking, not elsewhere classified (R26.2)     Time: 2694-8546 PT Time Calculation (min) (ACUTE ONLY): 26 min  Charges:  $Therapeutic Activity: 8-22 mins                     Lou Miner, DPT  Acute Rehabilitation Services  Pager: [phone] Office: [phone]    Rudean Hitt 07/04/2020, 4:28 PM

## 2020-07-04 NOTE — Progress Notes (Signed)
PROGRESS NOTE                                                                             PROGRESS NOTE                                                                                                                                                                                                             Patient Demographics:    Kelli [name], is a 70 y.o. female, DOB - 1950/12/30, AST:[id number]  Outpatient Primary MD for the patient is [name], [name], MD    LOS - 1  Admit date - 07/02/2020    Chief Complaint  Patient presents with  . Vascular Access Problem       Brief Narrative     70 year old female with past medical history of end-stage renal disease (Tues Thurs Sat), thrombosed and nonfunctioning bilateral upper extremity AV grafts, status post right IJ tunneled dialysis catheter, cryptogenic strokes in the past, insulin-dependent diabetes mellitus type 2, chronic diastolic congestive heart failure (Echo 08/2019 EF 60-65% with G1DD), hypertension, anxiety disorder presenting to [facility] emergency department as a transfer from [facility] due to complaints of generalized fatigue.  Patient did not complete her most hemodialysis session on 5/3, due to malfunctioning right IJ tunneled hemodialysis catheter, she was admitted to [facility], where she was seen by vascular surgery had her right IJ tunneled hemodialysis catheter exchanged, and right aVF was discontinued.   Subjective:    Kelli [name] today with no significant events overnight as discussed with staff, she is more sleepy and tired this morning after receiving her morning dose Klonopin.    Assessment  & Plan :    Principal Problem:   Hemodialysis catheter dysfunction, initial encounter [facility]) Active Problems:   Generalized anxiety disorder   Acute on chronic diastolic CHF (congestive heart failure) (HCC)   Type 2 diabetes mellitus with chronic kidney disease on  chronic dialysis, with long-term current use of insulin (HCC)   ESRD (end stage renal disease)  on dialysis [facility])   Mixed diabetic hyperlipidemia associated with type 2 diabetes mellitus ([place])   Essential hypertension  Hemodialysis catheter dysfunction/Ulceration of right upper extremity AV graft -Vascular surgery consulted, right aVF was discontinued, and she had her right IJ tunneled hemodialysis catheter exchanged.  Generalized anxiety disorder -We will hold patient Klonopin given her significant drowsiness after receiving it this morning  Acute metabolic encephalopathy -Overall patient is frail, weak, and more lethargic than baseline, this is most likely in the setting of her procedure yesterday, and receiving her chronic pain this morning, she has no focal deficits, but overall remains very weak, and she is lethargic, mentation not at baseline, I will discontinue Klonopin and continue to monitor.   Acute on chronic diastolic CHF (congestive heart failure) ([place]) - Patient is exhibiting coarse breath sounds with rales and wheezing on lung exam in the setting of a several day history of progressively worsening shortness of breath - Chest x-ray reveals some evidence of pulmonary edema -Volume overload in the setting of incomplete hemodialysis as an outpatient, so her volume will be managed with hemodialysis session. -Continue with torsemide  Type 2 diabetes mellitus with chronic kidney disease on chronic dialysis, with long-term current use of insulin ([place]) -Continue with insulin sliding scale  ESRD (end stage renal disease) on dialysis [facility]) - Renal consulted, she was dialyzed 5/5, and she will be dialyzed tomorrow   Mixed diabetic hyperlipidemia associated with type 2 diabetes mellitus ([place]) - Continue home regimen of statin therapy  Essential hypertension -Continue with home medication  Generalized weakness -We will consult PT/OT, recommendation for SNF, I think this is  most likely related to her lethargy, hopefully she will improve once mentation has improved, but husband reports he does not want SNF placement, and he would like her to go home with home health.    SpO2: 97 % O2 Flow Rate (L/min): 2 L/min  Recent Labs  Lab 07/02/20 1705 07/02/20 2059 07/03/20 0539 07/04/20 0211  WBC  --  10.3 11.0* 10.3  PLT  --  258 243 PLATELET CLUMPS NOTED ON SMEAR, UNABLE TO ESTIMATE  AST  --   --  16  --   ALT  --   --  19  --   ALKPHOS  --   --  73  --   BILITOT  --   --  0.5  --   ALBUMIN  --   --  2.8*  --   SARSCOV2NAA NEGATIVE  --   --   --        ABG     Component Value Date/Time   PHART 7.488 (H) 01/19/2017 0127   PCO2ART 37.6 01/19/2017 0127   PO2ART 124 (H) 01/19/2017 0127   HCO3 28.9 (H) 01/19/2017 0127   TCO2 25 06/06/2019 1033   O2SAT 98.8 01/19/2017 0127          Condition - Extremely Guarded  Family Communication  :  Husband at bedside  Code Status :  full  Consults  :  Renal, vascular surgery  Procedures  :   Dr. Servando Snare 07/03/2020 1.  Replacement of right IJ 19 cm tunneled dialysis catheter with fluoroscopic guidance 2.  Excision of right upper arm AV graft and stent   Disposition Plan  :    Status is: Observation  The patient remains OBS appropriate and will d/c before 2 midnights.  Dispo: The patient is from: Home  Anticipated d/c is to: Home              Patient currently is medically stable to d/c.   Difficult to place patient No      DVT Prophylaxis  : SCDs, subcu heparin  Lab Results  Component Value Date   PLT PLATELET CLUMPS NOTED ON SMEAR, UNABLE TO ESTIMATE 07/04/2020    Diet :  Diet Order            Diet renal with fluid restriction Fluid restriction: 1200 mL Fluid; Room service appropriate? Yes; Fluid consistency: Thin  Diet effective now                  Inpatient Medications  Scheduled Meds: . amLODipine  10 mg Oral QPM  . atorvastatin  40 mg Oral QPM  .  Chlorhexidine Gluconate Cloth  6 each Topical Q0600  . clopidogrel  75 mg Oral QPM  . heparin injection (subcutaneous)  5,000 Units Subcutaneous Q8H  . hydrALAZINE  50 mg Oral BID  . insulin aspart  0-9 Units Subcutaneous TID AC & HS  . insulin glargine  15 Units Subcutaneous Daily  . metoprolol succinate  100 mg Oral QPM  . torsemide  40 mg Oral Daily  . venlafaxine XR  150 mg Oral Daily   Continuous Infusions: . sodium chloride 10 mL/hr at 07/03/20 0822   PRN Meds:.acetaminophen **OR** acetaminophen, hydrALAZINE, ondansetron **OR** ondansetron (ZOFRAN) IV, polyethylene glycol  Antibiotics  :    Anti-infectives (From admission, onward)   None      Phillips Climes M.D on 07/04/2020 at 2:41 PM  To page go to [email or web address] -  [facility] -  Office  [phone]        Objective:   Vitals:   07/03/20 1859 07/03/20 2000 07/04/20 0410 07/04/20 0810  BP: 119/72 93/74 118/72 119/72  Pulse: 91 88 86   Resp: 16 20 18    Temp: 98.4 F (36.9 C) 98.3 F (36.8 C) 98.2 F (36.8 C)   TempSrc: Oral Oral Oral   SpO2: 94% 90% 97%   Weight:      Height:        Wt Readings from Last 3 Encounters:  07/03/20 78.4 kg  04/16/20 83 kg  12/19/19 89.8 kg     Intake/Output Summary (Last 24 hours) at 07/04/2020 1441 Last data filed at 07/03/2020 1805 Gross per 24 hour  Intake --  Output 626 ml  Net -626 ml     Physical Exam  She is sleepy, but wakes up, and communicate, but go back to sleep right away . Symmetrical Chest wall movement, Good air movement bilaterally, CTAB RRR,No Gallops,Rubs or new Murmurs, No Parasternal Heave +ve B.Sounds, Abd Soft, No tenderness, No rebound - guarding or rigidity. No Cyanosis, Clubbing or edema, No new Rash or bruise   Right arm fistula site bandaged with Ace wrap, right upper chest tunneled HD cath covered with gauze as well.     Data Review:    CBC Recent Labs  Lab 07/02/20 2059 07/03/20 0539 07/04/20 0211  WBC 10.3 11.0*  10.3  HGB 9.1* 9.6* 8.8*  HCT 29.7* 30.5* 26.3*  PLT 258 243 PLATELET CLUMPS NOTED ON SMEAR, UNABLE TO ESTIMATE  MCV 94.3 91.6 87.1  MCH 28.9 28.8 29.1  MCHC 30.6 31.5 33.5  RDW 14.9 14.8 14.6  LYMPHSABS 2.0 1.7  --   MONOABS 0.9 0.9  --   EOSABS 0.3 0.3  --   BASOSABS 0.0 0.1  --  Recent Labs  Lab 07/02/20 2059 07/03/20 0539 07/04/20 0211  NA 136 138 134*  K 4.3 4.2 4.0  CL 101 101 98  CO2 21* 23 26  GLUCOSE 102* 138* 106*  BUN 44* 49* 16  CREATININE 6.19* 6.63* 3.50*  CALCIUM 8.6* 8.7* 8.2*  AST  --  16  --   ALT  --  19  --   ALKPHOS  --  73  --   BILITOT  --  0.5  --   ALBUMIN  --  2.8*  --   MG  --  1.7  --   HGBA1C  --  6.0*  --     ------------------------------------------------------------------------------------------------------------------ No results for input(s): CHOL, HDL, LDLCALC, TRIG, CHOLHDL, LDLDIRECT in the last 72 hours.  Lab Results  Component Value Date   HGBA1C 6.0 (H) 07/03/2020   ------------------------------------------------------------------------------------------------------------------ No results for input(s): TSH, T4TOTAL, T3FREE, THYROIDAB in the last 72 hours.  Invalid input(s): FREET3  Cardiac Enzymes No results for input(s): CKMB, TROPONINI, MYOGLOBIN in the last 168 hours.  Invalid input(s): CK ------------------------------------------------------------------------------------------------------------------    Component Value Date/Time   BNP 1,264.0 (H) 01/27/2017 2340    Micro Results Recent Results (from the past 240 hour(s))  Resp Panel by RT-PCR (Flu A&B, Covid) Nasopharyngeal Swab     Status: None   Collection Time: 07/02/20  5:05 PM   Specimen: Nasopharyngeal Swab; Nasopharyngeal(NP) swabs in vial transport medium  Result Value Ref Range Status   SARS Coronavirus 2 by RT PCR NEGATIVE NEGATIVE Final    Comment: (NOTE) SARS-CoV-2 target nucleic acids are NOT DETECTED.  The SARS-CoV-2 RNA is generally  detectable in upper respiratory specimens during the acute phase of infection. The lowest concentration of SARS-CoV-2 viral copies this assay can detect is 138 copies/mL. A negative result does not preclude SARS-Cov-2 infection and should not be used as the sole basis for treatment or other patient management decisions. A negative result may occur with  improper specimen collection/handling, submission of specimen other than nasopharyngeal swab, presence of viral mutation(s) within the areas targeted by this assay, and inadequate number of viral copies(<138 copies/mL). A negative result must be combined with clinical observations, patient history, and epidemiological information. The expected result is Negative.  Fact Sheet for Patients:  EntrepreneurPulse.com.au  Fact Sheet for Healthcare Providers:  IncredibleEmployment.be  This test is no t yet approved or cleared by the [place] FDA and  has been authorized for detection and/or diagnosis of SARS-CoV-2 by FDA under an Emergency Use Authorization (EUA). This EUA will remain  in effect (meaning this test can be used) for the duration of the COVID-19 declaration under Section 564(b)(1) of the Act, 21 U.S.C.section 360bbb-3(b)(1), unless the authorization is terminated  or revoked sooner.       Influenza A by PCR NEGATIVE NEGATIVE Final   Influenza B by PCR NEGATIVE NEGATIVE Final    Comment: (NOTE) The Xpert Xpress SARS-CoV-2/FLU/RSV plus assay is intended as an aid in the diagnosis of influenza from Nasopharyngeal swab specimens and should not be used as a sole basis for treatment. Nasal washings and aspirates are unacceptable for Xpert Xpress SARS-CoV-2/FLU/RSV testing.  Fact Sheet for Patients: EntrepreneurPulse.com.au  Fact Sheet for Healthcare Providers: IncredibleEmployment.be  This test is not yet approved or cleared by the [place] FDA  and has been authorized for detection and/or diagnosis of SARS-CoV-2 by FDA under an Emergency Use Authorization (EUA). This EUA will remain in effect (meaning this test can be used) for the  duration of the COVID-19 declaration under Section 564(b)(1) of the Act, 21 U.S.C. section 360bbb-3(b)(1), unless the authorization is terminated or revoked.  Performed at [facility], [address], [place], [place] [address]   MRSA PCR Screening     Status: None   Collection Time: 07/03/20  5:14 AM   Specimen: Nasal Mucosa; Nasopharyngeal  Result Value Ref Range Status   MRSA by PCR NEGATIVE NEGATIVE Final    Comment:        The GeneXpert MRSA Assay (FDA approved for NASAL specimens only), is one component of a comprehensive MRSA colonization surveillance program. It is not intended to diagnose MRSA infection nor to guide or monitor treatment for MRSA infections. Performed at [facility], [place] [address] [place],  [address]     Radiology Reports DG CHEST PORT 1 VIEW  Result Date: 07/03/2020 CLINICAL DATA:  Dialysis catheter placement EXAM: PORTABLE CHEST 1 VIEW COMPARISON:  Jul 02, 2020 FINDINGS: Dual lumen catheter tip at cavoatrial junction. No pneumothorax. There is mild left base atelectasis. No edema or airspace opacity. There is cardiomegaly with pulmonary vascularity normal. No adenopathy. There is aortic atherosclerosis. There is a loop recorder on the left. There is a stent in the left brachial region. Bones are somewhat osteoporotic. IMPRESSION: Dual lumen catheter tip at cavoatrial junction without pneumothorax. No edema or airspace opacity. Mild left base atelectasis. Stable cardiomegaly. Loop recorder on the left. Aortic Atherosclerosis (ICD10-I70.0). Electronically Signed   By: Lowella Grip III M.D.   On: 07/03/2020 11:20   DG Chest Portable 1 View  Result Date: 07/02/2020 CLINICAL DATA:  Cough EXAM: PORTABLE CHEST 1 VIEW COMPARISON:  04/16/2020 FINDINGS:  Right-sided central venous catheter tip at the cavoatrial region. Electronic recording device over left chest. Cardiomegaly with probable small left effusion. Mild central congestion. Aortic atherosclerosis. No pneumothorax. Upper extremity vascular stents. IMPRESSION: 1. Right-sided central venous catheter tip over the cavoatrial region 2. Cardiomegaly with slight central congestion and probable small left effusion Electronically Signed   By: Donavan Foil M.D.   On: 07/02/2020 16:40   DG Fluoro Guide CV Line-No Report  Result Date: 07/03/2020 Fluoroscopy was utilized by the requesting physician.  No radiographic interpretation.

## 2020-07-05 DIAGNOSIS — N186 End stage renal disease: Secondary | ICD-10-CM | POA: Diagnosis not present

## 2020-07-05 DIAGNOSIS — T8241XD Breakdown (mechanical) of vascular dialysis catheter, subsequent encounter: Secondary | ICD-10-CM | POA: Diagnosis not present

## 2020-07-05 DIAGNOSIS — I5033 Acute on chronic diastolic (congestive) heart failure: Secondary | ICD-10-CM | POA: Diagnosis not present

## 2020-07-05 DIAGNOSIS — Z992 Dependence on renal dialysis: Secondary | ICD-10-CM | POA: Diagnosis not present

## 2020-07-05 DIAGNOSIS — I120 Hypertensive chronic kidney disease with stage 5 chronic kidney disease or end stage renal disease: Secondary | ICD-10-CM | POA: Diagnosis not present

## 2020-07-05 DIAGNOSIS — T8241XA Breakdown (mechanical) of vascular dialysis catheter, initial encounter: Secondary | ICD-10-CM | POA: Diagnosis not present

## 2020-07-05 LAB — GLUCOSE, CAPILLARY: Glucose-Capillary: 134 mg/dL — ABNORMAL HIGH (ref 70–99)

## 2020-07-05 MED ORDER — ACETAMINOPHEN 325 MG PO TABS: 650.0000 mg | ORAL_TABLET | Freq: Four times a day (QID) | ORAL | Status: AC | PRN

## 2020-07-05 MED ORDER — CLONAZEPAM 0.5 MG PO TABS: 0.2500 mg | ORAL_TABLET | Freq: Every day | ORAL | 0 refills | Status: DC

## 2020-07-05 NOTE — Procedures (Signed)
I was present at this dialysis session. I have reviewed the session itself and made appropriate changes.   TDC. 2K bath. UF goal 2L.  Pt toleratign well.   Filed Weights   07/03/20 1425 07/03/20 1805 07/05/20 0659  Weight: 79.5 kg 78.4 kg 76.5 kg    Recent Labs  Lab 07/04/20 0211  NA 134*  K 4.0  CL 98  CO2 26  GLUCOSE 106*  BUN 16  CREATININE 3.50*  CALCIUM 8.2*    Recent Labs  Lab 07/02/20 2059 07/03/20 0539 07/04/20 0211  WBC 10.3 11.0* 10.3  NEUTROABS 6.9 7.9*  --   HGB 9.1* 9.6* 8.8*  HCT 29.7* 30.5* 26.3*  MCV 94.3 91.6 87.1  PLT 258 243 PLATELET CLUMPS NOTED ON SMEAR, UNABLE TO ESTIMATE    Scheduled Meds: . amLODipine  10 mg Oral QPM  . atorvastatin  40 mg Oral QPM  . Chlorhexidine Gluconate Cloth  6 each Topical Q0600  . clopidogrel  75 mg Oral QPM  . heparin injection (subcutaneous)  5,000 Units Subcutaneous Q8H  . hydrALAZINE  50 mg Oral BID  . insulin aspart  0-9 Units Subcutaneous TID AC & HS  . insulin glargine  15 Units Subcutaneous Daily  . metoprolol succinate  100 mg Oral QPM  . torsemide  40 mg Oral Daily  . venlafaxine XR  150 mg Oral Daily   Continuous Infusions: . sodium chloride 10 mL/hr at 07/03/20 0822   PRN Meds:.acetaminophen **OR** acetaminophen, hydrALAZINE, ondansetron **OR** ondansetron (ZOFRAN) IV, polyethylene glycol   Pearson Grippe  MD 07/05/2020, 7:53 AM

## 2020-07-05 NOTE — Plan of Care (Signed)
Pt discharging home with husband. HH being arranged. Will cont to monitor.

## 2020-07-05 NOTE — Discharge Summary (Signed)
Kelli [name], is a 70 y.o. female  DOB 1951-01-06  MRN [id number].  Admission date:  07/02/2020  Admitting Physician  Albertine Patricia, MD  Discharge Date:  07/05/2020   Primary MD  Quintin Alto, [name], MD  Recommendations for primary care physician for things to follow:  -CBC, CMP during next visit -follow with vascular surgery as an outpatient.    Admission Diagnosis  ESRD (end stage renal disease) ([place]) [U27.2] Complication of vascular access for dialysis, initial encounter [T82.9XXA] Hemodialysis catheter dysfunction, initial encounter [facility]) [T82.41XA]   Discharge Diagnosis  ESRD (end stage renal disease) ([place]) [Z36.6] Complication of vascular access for dialysis, initial encounter [T82.9XXA] Hemodialysis catheter dysfunction, initial encounter ([place]) [T82.41XA]    Principal Problem:   Hemodialysis catheter dysfunction, initial encounter ([place]) Active Problems:   Generalized anxiety disorder   Acute on chronic diastolic CHF (congestive heart failure) ([place])   Type 2 diabetes mellitus with chronic kidney disease on chronic dialysis, with long-term current use of insulin (HCC)   ESRD (end stage renal disease) on dialysis ([place])   Mixed diabetic hyperlipidemia associated with type 2 diabetes mellitus ([place])   Essential hypertension      Past Medical History:  Diagnosis Date  . Anemia   . Anginal pain ([place])   . Anxiety   . Arthritis   . Carotid artery occlusion    Occluded RICA, status post left CEA  August 2014 - Dr. Donnetta Hutching  . Cerebral infarction [facility]) Aug 2014   Bihemispheric watershed infarcts  . Cerebral infarction involving left cerebellar artery [facility]) Feb 2015  . CHF (congestive heart failure) ([place])   . CKD (chronic kidney disease) stage 3, GFR 30-59 ml/min (HCC)    dialysis T/Th/sa  . Closed dislocation of left humerus 07/26/2013  . Depression   . DM (diabetes mellitus), type 2 ([place])   .  Dyspnea    with exertion  . Dysrhythmia   . Essential hypertension, benign   . Fibromyalgia   . Headache   . History of kidney stones   . Mixed hyperlipidemia   . Multiple gastric ulcers   . Pneumonia   . Stroke [facility])    pt states she cannot walk, left arm weakness  . Urinary incontinence     Past Surgical History:  Procedure Laterality Date  . A/V FISTULAGRAM N/A 06/06/2019   Procedure: A/V FISTULAGRAM;  Surgeon: Marty Heck, MD;  Location: [place] CV LAB;  Service: Cardiovascular;  Laterality: N/A;  . AV FISTULA PLACEMENT Left 04/15/2017   Procedure: ARTERIOVENOUS (AV) FISTULA CREATION LEFT ARM;  Surgeon: Serafina Mitchell, MD;  [facility] OR;  Service: Vascular;  Laterality: Left;  . AV FISTULA PLACEMENT Left 05/09/2017   Procedure: INSERTION OF ARTERIOVENOUS (AV) GORE-TEX GRAFT LEFT UPPER ARM;  Surgeon: [name], MD;  Location: [facility] OR;  Service: Vascular;  Laterality: Left;  . AV FISTULA PLACEMENT Right 09/06/2018   Procedure: INSERTION OF ARTERIOVENOUS (AV) GORE-TEX GRAFT RIGHT  ARM;  Surgeon: [name], MD;  Location:  [facility] OR;  Service: Vascular;  Laterality: Right;  . AV FISTULA PLACEMENT Right 07/03/2020   Procedure: Removal of right upper arm Gortex graft;  Surgeon: Waynetta Sandy, MD;  Location: [place];  Service: Vascular;  Laterality: Right;  . BASCILIC VEIN TRANSPOSITION Right 08/09/2018   Procedure: BASILIC VEIN TRANSPOSITION 1st Stage;  Surgeon: [name], MD;  Location: [place];  Service: Vascular;  Laterality: Right;  . BIOPSY  01/22/2017   Procedure: BIOPSY;  Surgeon: Danie Binder, MD;  [facility];  Service: Endoscopy;;  gastric  . COMBINED HYSTERECTOMY VAGINAL W/ MMK / A&P REPAIR  1981  . ENDARTERECTOMY Left 10/06/2012   Procedure: Carotid Endarterectomy with Finesse patch angioplasty;  Surgeon: [name], MD;  Location: [place];  Service: Vascular;  Laterality: Left;  . ESOPHAGOGASTRODUODENOSCOPY N/A 04/26/2014   Procedure:  ESOPHAGOGASTRODUODENOSCOPY (EGD);  Surgeon: Lear Ng, MD;  Location: [facility] ENDOSCOPY;  Service: Endoscopy;  Laterality: N/A;  . ESOPHAGOGASTRODUODENOSCOPY N/A 01/22/2017   Procedure: ESOPHAGOGASTRODUODENOSCOPY (EGD);  Surgeon: Danie Binder, MD;  [facility];  Service: Endoscopy;  Laterality: N/A;  . FLEXIBLE SIGMOIDOSCOPY N/A 01/22/2017   Procedure: FLEXIBLE SIGMOIDOSCOPY;  Surgeon: Danie Binder, MD;  [facility];  Service: Endoscopy;  Laterality: N/A;  . INSERTION OF DIALYSIS CATHETER Right 07/03/2020   Procedure: INSERTION OF DIALYSIS CATHETER and exchange of right internal jugular dialysis catheter;  Surgeon: Waynetta Sandy, MD;  Location: [place];  Service: Vascular;  Laterality: Right;  . IR CV LINE INJECTION  02/04/2017  . IR CV LINE INJECTION  04/28/2018  . IR CV LINE INJECTION  05/12/2018  . IR CV LINE INJECTION  05/18/2018  . IR FLUORO GUIDE CV LINE LEFT  01/14/2017  . IR FLUORO GUIDE CV LINE LEFT  04/25/2017  . IR FLUORO GUIDE CV LINE LEFT  05/13/2017  . IR FLUORO GUIDE CV LINE LEFT  03/02/2018  . IR FLUORO GUIDE CV LINE LEFT  04/28/2018  . IR FLUORO GUIDE CV LINE LEFT  05/12/2018  . IR FLUORO GUIDE CV LINE LEFT  05/18/2018  . IR FLUORO GUIDE CV LINE LEFT  06/23/2018  . IR FLUORO GUIDE CV LINE LEFT  08/02/2018  . IR PTA VENOUS EXCEPT DIALYSIS CIRCUIT  04/28/2018  . IR PTA VENOUS EXCEPT DIALYSIS CIRCUIT  05/12/2018  . IR REMOVAL TUN CV CATH W/O FL  07/08/2017  . IR REMOVAL TUN CV CATH W/O FL  10/11/2018  . IR TRANSCATH RETRIEVAL FB INCL GUIDANCE (MS)  05/12/2018  . IR US GUIDE VASC ACCESS LEFT  01/14/2017  . LOOP RECORDER IMPLANT  04/16/13   MDT LinQ implanted for cryptogenic stroke  . PERIPHERAL VASCULAR INTERVENTION Right 06/06/2019   Procedure: PERIPHERAL VASCULAR INTERVENTION;  Surgeon: Marty Heck, MD;  Location: [place] CV LAB;  Service: Cardiovascular;  Laterality: Right;  . [name] WITHOUT CARDIOVERSION N/A 04/16/2013   Procedure:  TRANSESOPHAGEAL ECHOCARDIOGRAM ([name]);  Surgeon: Josue Hector, MD;  Location: [place];  Service: Cardiovascular;  Laterality: N/A;  . THROMBECTOMY AND REVISION OF ARTERIOVENTOUS (AV) GORETEX  GRAFT Left 07/24/2017   Procedure: THROMBECTOMY OF LEFT UPPER ARM ARTERIOVENTOUS (AV) GRAFT;  Surgeon: Conrad Harrington [place], MD;  Location: [place];  Service: Vascular;  Laterality: Left;  . URETHRAL DILATION  1980's  . 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  Patient explains that over the past week she has been developing worsening fatigue.  This fatigue wound is initially mild in intensity but now severe.  Fatigue is associated with shortness of breath that is worse with exertion and improved with rest.  Patient complains of associated audible wheezing and occasional cough that is productive with clear sputum.  Patient denies any associated peripheral edema or leg swelling.    Of note, patient also complains of some redness and discomfort of the right upper extremity graft as well.  Patient denies any associated swelling.  Patient denies any fevers, sick contacts, recent travel or contact  with confirmed COVID-19 infection.  Patient explains that on Tuesday she was unable to complete her dialysis session at her usual dialysis center in [place] due to a malfunctioning hemodialysis catheter.  Ranges have been made for the patient to follow-up with vascular surgery in the outpatient setting for evaluation of this catheter however due to progressively worsening symptoms as noted above the patient eventually presented to [facility] emergency department for evaluation.  Upon evaluation at [facility] emergency department Case was discussed with Dr. Stanford Breed with vascular surgery who recommended transfer to [facility] for vascular surgery evaluation.  Upon arrival at [facility] emergency department patient was evaluated by vascular surgery and arranges have been made for the patient to undergo replacement of the tunneled dialysis catheter.  Right AV graft was also evaluated by vascular surgery and was not felt to be infected however partial excision will be performed in the OR in the morning as well.  The hospitalist group was then called to assess the patient for admission to the hospital  Hospital Course     Hemodialysis catheter dysfunction/Ulceration of right upper extremity AV graft -Vascular surgery consulted, right aVF was discontinued, and she had her right IJ tunneled hemodialysis catheter exchanged.  Generalized anxiety disorder -She had some increased sleepiness and after giving her Klonopin dose, so I have decreased her dose to 0.25 mg on discharge .  Acute metabolic encephalopathy -Overall patient is frail, weak, and more lethargic than baseline, this is most likely in the setting of her procedure yesterday, and receiving her chronic pain this morning, she has no focal deficits, but overall remains very weak, and she is lethargic, mentation not at baseline, I will discontinue Klonopin and continue to monitor.  Acute on chronicdiastolic CHF (congestive  heart failure) ([place]) - Patient is exhibiting coarse breath sounds with rales and wheezing on lung exam in the setting of a several day history of progressively worsening shortness of breath - Chest x-ray reveals some evidence of pulmonary edema -Volume overload in the setting of incomplete hemodialysis as an outpatient, so her volume will be managed with hemodialysis session.  She appears to be euvolemic at time of discharge -Continue with torsemide  Type 2 diabetes mellitus with  chronic kidney disease on chronic dialysis, with long-term current use of insulin (HCC) -Resume home regimen on discharge  ESRD (end stage renal disease) on dialysis [facility]) - Renal consulted, she was dialyzed 5/5, and dialyzed today before discharge, to continue hemodialysis on regular schedule as an outpatient  Mixed diabetic hyperlipidemia associated with type 2 diabetes mellitus ([place]) - Continue home regimen of statin therapy  Essential hypertension -Continue with home medication  Generalized weakness -We will consult PT/OT, recommendation for SNF, this was mainly in setting of chemotherapy with chronic pain, now she has improved, I have discussed with husband, currently he is agreeable, which will be arranged.      Discharge Condition:  Stable  Follow UP   Follow-up Information    [facility] Follow up in 2 week(s).   Specialty: Vascular Surgery Why: sent Contact information: [address] [place] [phone]                Discharge Instructions  and  Discharge Medications    Discharge Instructions    Diet - low sodium heart healthy   Complete by: As directed    Discharge instructions   Complete by: As directed    Follow with Primary MD [name], [name], MD in 7 days   Get CBC, CMP,  checked  by Primary MD next visit.    Activity: As tolerated with Full fall precautions use walker/cane & assistance as  needed   Disposition Home    Diet: Renal / carb modified , with feeding assistance and aspiration precautions.  For Heart failure patients - Check your Weight same time everyday, if you gain over 2 pounds, or you develop in leg swelling, experience more shortness of breath or chest pain, call your Primary MD immediately. Follow Cardiac Low Salt Diet and 1.5 lit/day fluid restriction.   On your next visit with your primary care physician please Get Medicines reviewed and adjusted.   Please request your Prim.MD to go over all Hospital Tests and Procedure/Radiological results at the follow up, please get all Hospital records sent to your Prim MD by signing hospital release before you go home.   If you experience worsening of your admission symptoms, develop shortness of breath, life threatening emergency, suicidal or homicidal thoughts you must seek medical attention immediately by calling 911 or calling your MD immediately  if symptoms less severe.  You Must read complete instructions/literature along with all the possible adverse reactions/side effects for all the Medicines you take and that have been prescribed to you. Take any new Medicines after you have completely understood and accpet all the possible adverse reactions/side effects.   Do not drive, operating heavy machinery, perform activities at heights, swimming or participation in water activities or provide baby sitting services if your were admitted for syncope or siezures until you have seen by Primary MD or a Neurologist and advised to do so again.  Do not drive when taking Pain medications.    Do not take more than prescribed Pain, Sleep and Anxiety Medications  Special Instructions: If you have smoked or chewed Tobacco  in the last 2 yrs please stop smoking, stop any regular Alcohol  and or any Recreational drug use.  Wear Seat belts while driving.   Please note  You were cared for by a hospitalist during your hospital  stay. If you have any questions about your discharge medications or the care you received while you were in the hospital after  you are discharged, you can call the unit and asked to speak with the hospitalist on call if the hospitalist that took care of you is not available. Once you are discharged, your primary care physician will handle any further medical issues. Please note that NO REFILLS for any discharge medications will be authorized once you are discharged, as it is imperative that you return to your primary care physician (or establish a relationship with a primary care physician if you do not have one) for your aftercare needs so that they can reassess your need for medications and monitor your lab values.   Discharge wound care:   Complete by: As directed    Incision Care, Adult An incision is a surgical cut that is made through your skin. Most incisions are closed after a surgical procedure. Your incision may be closed with stitches (sutures), staples, skin glue, or adhesive strips. You may need to return to your health care provider to have sutures or staples removed. This may occur several days or several weeks after your surgery. Until then, the incision needs to be cared for properly to prevent infection. Follow instructions from your health care provider about how to care for your incision. Supplies needed: Soap, water, and a clean hand towel. Wound cleanser. A clean bandage (dressing), if needed. Cream or ointment, if told by your health care provider. Clean gauze. How to care for your incision Cleaning the incision Ask your health care provider how to clean the incision. This may include: Using mild soap and water, or wound cleanser. Using a clean gauze to pat the incision dry after cleaning it. Dressing changes Wash your hands with soap and water for at least 20 seconds before and after you change the dressing. If soap and water are not available, use hand sanitizer. Change  your dressing as told by your health care provider. Leave sutures, staples, skin glue, or adhesive strips in place. These skin closures may need to stay in place for 2 weeks or longer. If adhesive strip edges start to loosen and curl up, you may trim the loose edges. Do not remove adhesive strips completely unless your health care provider tells you to do that. Apply cream or ointment. Do this only as told by your health care provider. Cover the incision with a clean dressing. Ask your health care provider when you can begin leaving the incision uncovered. Checking for infection Check your incision area every day for signs of infection. Check for: More redness, swelling, or pain. More fluid or blood. Warmth. Pus or a bad smell.  Follow these instructions at home Medicines Take over-the-counter and prescription medicines only as told by your health care provider. If you were prescribed an antibiotic medicine, cream, or ointment, take or apply it as told by your health care provider. Do not stop using the antibiotic even if your condition improves. Eating and drinking Eat a diet that includes protein, vitamin A, vitamin C, and other nutrient-rich foods to help the wound heal. Foods rich in protein include meat, fish, eggs, dairy, beans, and nuts. Foods rich in vitamin A include carrots and dark green, leafy vegetables. Foods rich in vitamin C include citrus fruits, tomatoes, broccoli, and peppers. Drink enough fluid to keep your urine pale yellow. General instructions Do not take baths, swim, use a hot tub, or do anything that would put the incision underwater until your health care provider approves. Ask your health care provider if you may take showers. You may only be  allowed to take sponge baths. Limit movement around your incision to promote healing. Avoid straining, lifting, or exercising for the first 2 weeks after your procedure, or for as long as told by your health care  provider. Return to your normal activities as told by your health care provider. Ask your health care provider what activities are safe for you. Do not scratch or pick at the incision. Keep it covered as told by your health care provider. Protect your incision from the sun when you are outside for the first 6 months, or for as long as told by your health care provider. Cover up the scar area or apply sunscreen that has an SPF of at least 75. Do not use any products that contain nicotine or tobacco, such as cigarettes, e-cigarettes, and chewing tobacco. These can delay incision healing after surgery. If you need help quitting, ask your health care provider. Keep all follow-up visits as told by your health care provider. This is important.  Contact a health care provider if: You have any of these signs of infection: More redness, swelling, or pain around your incision. More fluid or blood coming from your incision. Warmth coming from your incision. Pus or a bad smell coming from your incision. A fever. You are nauseous or you vomit. You are dizzy. Your sutures, staples, skin glue, or adhesive strips come undone. Get help right away if: You have a red streak on the skin near your incision. Your incision bleeds through the dressing and the bleeding does not stop with gentle pressure. The edges of your incision open up and separate. You have signs of a serious bodily reaction to an infection. These signs may include: Fever, shaking chills, or feeling very cold. Confusion or anxiety. Severe pain. Trouble breathing. Fast heartbeat. Clammy or sweaty skin. A rash. These symptoms may represent a serious problem that is an emergency. Do not wait to see if the symptoms will go away. Get medical help right away. Call your local emergency services (911 in the [place] Do not drive yourself to the hospital. Summary Follow instructions from your health care provider about how to care for your  incision. Wash your hands with soap and water for at least 20 seconds before and after you change the dressing. If soap and water are not available, use hand sanitizer. Check your incision area every day for signs of infection. Keep all follow-up visits as told by your health care provider. This is important. This information is not intended to replace advice given to you by your health care provider. Make sure you discuss any questions you have with your health care provider. Document Revised: 12/06/2018 Document Reviewed: 12/06/2018 Elsevier Patient Education  [place].   Increase activity slowly   Complete by: As directed      Allergies as of 07/05/2020      Reactions   Hydrochlorothiazide Nausea And Vomiting      Medication List    TAKE these medications   acetaminophen 325 MG tablet Commonly known as: TYLENOL Take 2 tablets (650 mg total) by mouth every 6 (six) hours as needed for mild pain (or Fever >/= 101). What changed:   medication strength  how much to take  reasons to take this   amLODipine 10 MG tablet Commonly known as: NORVASC Take 10 mg by mouth every evening.   atorvastatin 40 MG tablet Commonly known as: LIPITOR Take 40 mg by mouth every evening.   Basaglar KwikPen 100 UNIT/ML Inject 15  Units into the skin daily.   bismuth subsalicylate 664 QI/34VQ suspension Commonly known as: PEPTO BISMOL Take 30 mLs by mouth every 6 (six) hours as needed for indigestion.   clonazePAM 0.5 MG tablet Commonly known as: KLONOPIN Take 0.5 tablets (0.25 mg total) by mouth daily. What changed: how much to take   clopidogrel 75 MG tablet Commonly known as: PLAVIX Take 1 tablet (75 mg total) by mouth daily. What changed: when to take this   hydrALAZINE 50 MG tablet Commonly known as: APRESOLINE Take 1 tablet (50 mg total) by mouth 3 (three) times daily. What changed: when to take this   loperamide 2 MG capsule Commonly known as: IMODIUM Take 2 mg by  mouth as needed for diarrhea or loose stools.   methocarbamol 500 MG tablet Commonly known as: ROBAXIN Take 1 tablet (500 mg total) by mouth every 6 (six) hours as needed for muscle spasms.   metoprolol succinate 100 MG 24 hr tablet Commonly known as: TOPROL-XL Take 1 tablet (100 mg total) by mouth daily. What changed: when to take this   torsemide 20 MG tablet Commonly known as: DEMADEX Take 40 mg by mouth daily.   venlafaxine XR 150 MG 24 hr capsule Commonly known as: EFFEXOR-XR Take 150 mg by mouth daily.            Discharge Care Instructions  (From admission, onward)         Start     Ordered   07/05/20 0000  Discharge wound care:       Comments: Incision Care, Adult An incision is a surgical cut that is made through your skin. Most incisions are closed after a surgical procedure. Your incision may be closed with stitches (sutures), staples, skin glue, or adhesive strips. You may need to return to your health care provider to have sutures or staples removed. This may occur several days or several weeks after your surgery. Until then, the incision needs to be cared for properly to prevent infection. Follow instructions from your health care provider about how to care for your incision. Supplies needed: Soap, water, and a clean hand towel. Wound cleanser. A clean bandage (dressing), if needed. Cream or ointment, if told by your health care provider. Clean gauze. How to care for your incision Cleaning the incision Ask your health care provider how to clean the incision. This may include: Using mild soap and water, or wound cleanser. Using a clean gauze to pat the incision dry after cleaning it. Dressing changes Wash your hands with soap and water for at least 20 seconds before and after you change the dressing. If soap and water are not available, use hand sanitizer. Change your dressing as told by your health care provider. Leave sutures, staples, skin glue, or  adhesive strips in place. These skin closures may need to stay in place for 2 weeks or longer. If adhesive strip edges start to loosen and curl up, you may trim the loose edges. Do not remove adhesive strips completely unless your health care provider tells you to do that. Apply cream or ointment. Do this only as told by your health care provider. Cover the incision with a clean dressing. Ask your health care provider when you can begin leaving the incision uncovered. Checking for infection Check your incision area every day for signs of infection. Check for: More redness, swelling, or pain. More fluid or blood. Warmth. Pus or a bad smell.  Follow these instructions at home Medicines Take  over-the-counter and prescription medicines only as told by your health care provider. If you were prescribed an antibiotic medicine, cream, or ointment, take or apply it as told by your health care provider. Do not stop using the antibiotic even if your condition improves. Eating and drinking Eat a diet that includes protein, vitamin A, vitamin C, and other nutrient-rich foods to help the wound heal. Foods rich in protein include meat, fish, eggs, dairy, beans, and nuts. Foods rich in vitamin A include carrots and dark green, leafy vegetables. Foods rich in vitamin C include citrus fruits, tomatoes, broccoli, and peppers. Drink enough fluid to keep your urine pale yellow. General instructions Do not take baths, swim, use a hot tub, or do anything that would put the incision underwater until your health care provider approves. Ask your health care provider if you may take showers. You may only be allowed to take sponge baths. Limit movement around your incision to promote healing. Avoid straining, lifting, or exercising for the first 2 weeks after your procedure, or for as long as told by your health care provider. Return to your normal activities as told by your health care provider. Ask your health care  provider what activities are safe for you. Do not scratch or pick at the incision. Keep it covered as told by your health care provider. Protect your incision from the sun when you are outside for the first 6 months, or for as long as told by your health care provider. Cover up the scar area or apply sunscreen that has an SPF of at least 20. Do not use any products that contain nicotine or tobacco, such as cigarettes, e-cigarettes, and chewing tobacco. These can delay incision healing after surgery. If you need help quitting, ask your health care provider. Keep all follow-up visits as told by your health care provider. This is important.  Contact a health care provider if: You have any of these signs of infection: More redness, swelling, or pain around your incision. More fluid or blood coming from your incision. Warmth coming from your incision. Pus or a bad smell coming from your incision. A fever. You are nauseous or you vomit. You are dizzy. Your sutures, staples, skin glue, or adhesive strips come undone. Get help right away if: You have a red streak on the skin near your incision. Your incision bleeds through the dressing and the bleeding does not stop with gentle pressure. The edges of your incision open up and separate. You have signs of a serious bodily reaction to an infection. These signs may include: Fever, shaking chills, or feeling very cold. Confusion or anxiety. Severe pain. Trouble breathing. Fast heartbeat. Clammy or sweaty skin. A rash. These symptoms may represent a serious problem that is an emergency. Do not wait to see if the symptoms will go away. Get medical help right away. Call your local emergency services (911 in the [place] Do not drive yourself to the hospital. Summary Follow instructions from your health care provider about how to care for your incision. Wash your hands with soap and water for at least 20 seconds before and after you change the dressing.  If soap and water are not available, use hand sanitizer. Check your incision area every day for signs of infection. Keep all follow-up visits as told by your health care provider. This is important. This information is not intended to replace advice given to you by your health care provider. Make sure you discuss any questions you have with  your health care provider. Document Revised: 12/06/2018 Document Reviewed: 12/06/2018 Elsevier Patient Education  2021 [place].   07/05/20 1058            Diet and Activity recommendation: See Discharge Instructions above   Consults obtained -  Vascular Surgery Renal   Major procedures and Radiology Reports - PLEASE review detailed and final reports for all details, in brief -   Dr. Servando Snare 07/03/2020 1.Replacement of right IJ 19 cm tunneled dialysis catheter with fluoroscopic guidance 2.Excision of right upper arm AV graft and stent    DG CHEST PORT 1 VIEW  Result Date: 07/03/2020 CLINICAL DATA:  Dialysis catheter placement EXAM: PORTABLE CHEST 1 VIEW COMPARISON:  Jul 02, 2020 FINDINGS: Dual lumen catheter tip at cavoatrial junction. No pneumothorax. There is mild left base atelectasis. No edema or airspace opacity. There is cardiomegaly with pulmonary vascularity normal. No adenopathy. There is aortic atherosclerosis. There is a loop recorder on the left. There is a stent in the left brachial region. Bones are somewhat osteoporotic. IMPRESSION: Dual lumen catheter tip at cavoatrial junction without pneumothorax. No edema or airspace opacity. Mild left base atelectasis. Stable cardiomegaly. Loop recorder on the left. Aortic Atherosclerosis (ICD10-I70.0). Electronically Signed   By: Lowella Grip III M.D.   On: 07/03/2020 11:20   DG Chest Portable 1 View  Result Date: 07/02/2020 CLINICAL DATA:  Cough EXAM: PORTABLE CHEST 1 VIEW COMPARISON:  04/16/2020 FINDINGS: Right-sided central venous catheter tip at the cavoatrial region.  Electronic recording device over left chest. Cardiomegaly with probable small left effusion. Mild central congestion. Aortic atherosclerosis. No pneumothorax. Upper extremity vascular stents. IMPRESSION: 1. Right-sided central venous catheter tip over the cavoatrial region 2. Cardiomegaly with slight central congestion and probable small left effusion Electronically Signed   By: Donavan Foil M.D.   On: 07/02/2020 16:40   DG Fluoro Guide CV Line-No Report  Result Date: 07/03/2020 Fluoroscopy was utilized by the requesting physician.  No radiographic interpretation.    Micro Results     Recent Results (from the past 240 hour(s))  Resp Panel by RT-PCR (Flu A&B, Covid) Nasopharyngeal Swab     Status: None   Collection Time: 07/02/20  5:05 PM   Specimen: Nasopharyngeal Swab; Nasopharyngeal(NP) swabs in vial transport medium  Result Value Ref Range Status   SARS Coronavirus 2 by RT PCR NEGATIVE NEGATIVE Final    Comment: (NOTE) SARS-CoV-2 target nucleic acids are NOT DETECTED.  The SARS-CoV-2 RNA is generally detectable in upper respiratory specimens during the acute phase of infection. The lowest concentration of SARS-CoV-2 viral copies this assay can detect is 138 copies/mL. A negative result does not preclude SARS-Cov-2 infection and should not be used as the sole basis for treatment or other patient management decisions. A negative result may occur with  improper specimen collection/handling, submission of specimen other than nasopharyngeal swab, presence of viral mutation(s) within the areas targeted by this assay, and inadequate number of viral copies(<138 copies/mL). A negative result must be combined with clinical observations, patient history, and epidemiological information. The expected result is Negative.  Fact Sheet for Patients:  EntrepreneurPulse.com.au  Fact Sheet for Healthcare Providers:  IncredibleEmployment.be  This test is no t  yet approved or cleared by the [place] FDA and  has been authorized for detection and/or diagnosis of SARS-CoV-2 by FDA under an Emergency Use Authorization (EUA). This EUA will remain  in effect (meaning this test can be used) for the duration of the COVID-19 declaration under Section  564(b)(1) of the Act, 21 U.S.C.section 360bbb-3(b)(1), unless the authorization is terminated  or revoked sooner.       Influenza A by PCR NEGATIVE NEGATIVE Final   Influenza B by PCR NEGATIVE NEGATIVE Final    Comment: (NOTE) The Xpert Xpress SARS-CoV-2/FLU/RSV plus assay is intended as an aid in the diagnosis of influenza from Nasopharyngeal swab specimens and should not be used as a sole basis for treatment. Nasal washings and aspirates are unacceptable for Xpert Xpress SARS-CoV-2/FLU/RSV testing.  Fact Sheet for Patients: EntrepreneurPulse.com.au  Fact Sheet for Healthcare Providers: IncredibleEmployment.be  This test is not yet approved or cleared by the [place] FDA and has been authorized for detection and/or diagnosis of SARS-CoV-2 by FDA under an Emergency Use Authorization (EUA). This EUA will remain in effect (meaning this test can be used) for the duration of the COVID-19 declaration under Section 564(b)(1) of the Act, 21 U.S.C. section 360bbb-3(b)(1), unless the authorization is terminated or revoked.  Performed at [facility], [address], [place], [place] [address]   MRSA PCR Screening     Status: None   Collection Time: 07/03/20  5:14 AM   Specimen: Nasal Mucosa; Nasopharyngeal  Result Value Ref Range Status   MRSA by PCR NEGATIVE NEGATIVE Final    Comment:        The GeneXpert MRSA Assay (FDA approved for NASAL specimens only), is one component of a comprehensive MRSA colonization surveillance program. It is not intended to diagnose MRSA infection nor to guide or monitor treatment for MRSA infections. Performed at [facility], [place] [address], [place], [place] [address]        Today   Subjective:   Brycelyn Gambino today has no headache,no chest or  abdominal pain, denies any complaints today, reports she wants to go home today..  Objective:   Blood pressure 121/64, pulse 96, temperature 98.6 F (37 C), temperature source Oral, resp. rate 18, height 5\' 5"  (1.651 m), weight 75.5 kg, SpO2 99 %.   Intake/Output Summary (Last 24 hours) at 07/05/2020 1100 Last data filed at 07/05/2020 1028 Gross per 24 hour  Intake --  Output 1194 ml  Net -1194 ml    Exam Awake Alert, Oriented x 3, she is more awake, and appropriate today  Symmetrical Chest wall movement, Good air movement bilaterally, CTAB RRR,No Gallops,Rubs or new Murmurs, No Parasternal Heave +ve B.Sounds, Abd Soft, Non tender,  No rebound -guarding or rigidity. No Cyanosis, Clubbing or edema, right upper arm bandaged with Ace, right upper chest HD catheter covered with bandage  Data Review   CBC w Diff:  Lab Results  Component Value Date   WBC 10.3 07/04/2020   HGB 8.8 (L) 07/04/2020   HCT 26.3 (L) 07/04/2020   PLT PLATELET CLUMPS NOTED ON SMEAR, UNABLE TO ESTIMATE 07/04/2020   LYMPHOPCT 16 07/03/2020   MONOPCT 8 07/03/2020   EOSPCT 3 07/03/2020   BASOPCT 1 07/03/2020    CMP:  Lab Results  Component Value Date   NA 134 (L) 07/04/2020   K 4.0 07/04/2020   CL 98 07/04/2020   CO2 26 07/04/2020   BUN 16 07/04/2020   CREATININE 3.50 (H) 07/04/2020   PROT 6.1 (L) 07/03/2020   ALBUMIN 2.8 (L) 07/03/2020   BILITOT 0.5 07/03/2020   ALKPHOS 73 07/03/2020   AST 16 07/03/2020   ALT 19 07/03/2020  .   Total Time in preparing paper work, data evaluation and todays exam - 35 minutes  Tesa Meadors  Axavier Pressley M.D on 07/05/2020 at 11:00 AM  [facility]   Office  [phone]

## 2020-07-05 NOTE — TOC Transition Note (Signed)
Transition of Care [name]) - CM/SW Discharge Note   Patient Details  Name: Kelli [name] MRN: [id number] Date of Birth: January 30, 1951  [name] CM/SW Contact:  Carles Collet, RN Phone Number: 07/05/2020, 11:47 AM   Clinical Narrative:    Kelli [name] w patient and spouse at bedside. They would like to DC to home w [name] PT. They do not have a preference for provider, discussed medicare ratings, referral accepted by [name]. Discussed DME and they confirm none needed.     Final next level of care: [name] Barriers to Discharge: No Barriers Identified   Patient Goals and CMS Choice Patient states their goals for this hospitalization and ongoing recovery are:: to go home CMS Medicare.gov Compare Post Acute Care list provided to:: Other (Comment Required) Choice offered to / list presented to : Spouse  Discharge Placement                       Discharge Plan and Services   Discharge Planning Services: CM Consult            DME Arranged: N/A         HH Arranged: PT HH Agency: [name] Date [place]: 07/05/20 Time [place]: 0677 Representative spoke with at [place]: [place] ([place]) Interventions     Readmission Risk Interventions No flowsheet data found.

## 2020-07-08 DIAGNOSIS — N186 End stage renal disease: Secondary | ICD-10-CM | POA: Diagnosis not present

## 2020-07-08 DIAGNOSIS — N2581 Secondary hyperparathyroidism of renal origin: Secondary | ICD-10-CM | POA: Diagnosis not present

## 2020-07-08 DIAGNOSIS — D631 Anemia in chronic kidney disease: Secondary | ICD-10-CM | POA: Diagnosis not present

## 2020-07-08 DIAGNOSIS — Z992 Dependence on renal dialysis: Secondary | ICD-10-CM | POA: Diagnosis not present

## 2020-07-08 DIAGNOSIS — D509 Iron deficiency anemia, unspecified: Secondary | ICD-10-CM | POA: Diagnosis not present

## 2020-07-10 DIAGNOSIS — N186 End stage renal disease: Secondary | ICD-10-CM | POA: Diagnosis not present

## 2020-07-10 DIAGNOSIS — D631 Anemia in chronic kidney disease: Secondary | ICD-10-CM | POA: Diagnosis not present

## 2020-07-10 DIAGNOSIS — Z992 Dependence on renal dialysis: Secondary | ICD-10-CM | POA: Diagnosis not present

## 2020-07-10 DIAGNOSIS — N2581 Secondary hyperparathyroidism of renal origin: Secondary | ICD-10-CM | POA: Diagnosis not present

## 2020-07-10 DIAGNOSIS — D509 Iron deficiency anemia, unspecified: Secondary | ICD-10-CM | POA: Diagnosis not present

## 2020-07-12 DIAGNOSIS — N186 End stage renal disease: Secondary | ICD-10-CM | POA: Diagnosis not present

## 2020-07-12 DIAGNOSIS — D509 Iron deficiency anemia, unspecified: Secondary | ICD-10-CM | POA: Diagnosis not present

## 2020-07-12 DIAGNOSIS — N2581 Secondary hyperparathyroidism of renal origin: Secondary | ICD-10-CM | POA: Diagnosis not present

## 2020-07-12 DIAGNOSIS — Z992 Dependence on renal dialysis: Secondary | ICD-10-CM | POA: Diagnosis not present

## 2020-07-12 DIAGNOSIS — D631 Anemia in chronic kidney disease: Secondary | ICD-10-CM | POA: Diagnosis not present

## 2020-07-14 DIAGNOSIS — E782 Mixed hyperlipidemia: Secondary | ICD-10-CM | POA: Diagnosis not present

## 2020-07-14 DIAGNOSIS — E1122 Type 2 diabetes mellitus with diabetic chronic kidney disease: Secondary | ICD-10-CM | POA: Diagnosis not present

## 2020-07-14 DIAGNOSIS — I5033 Acute on chronic diastolic (congestive) heart failure: Secondary | ICD-10-CM | POA: Diagnosis not present

## 2020-07-14 DIAGNOSIS — Z7902 Long term (current) use of antithrombotics/antiplatelets: Secondary | ICD-10-CM | POA: Diagnosis not present

## 2020-07-14 DIAGNOSIS — Z992 Dependence on renal dialysis: Secondary | ICD-10-CM | POA: Diagnosis not present

## 2020-07-14 DIAGNOSIS — T8249XD Other complication of vascular dialysis catheter, subsequent encounter: Secondary | ICD-10-CM | POA: Diagnosis not present

## 2020-07-14 DIAGNOSIS — M1990 Unspecified osteoarthritis, unspecified site: Secondary | ICD-10-CM | POA: Diagnosis not present

## 2020-07-14 DIAGNOSIS — D631 Anemia in chronic kidney disease: Secondary | ICD-10-CM | POA: Diagnosis not present

## 2020-07-14 DIAGNOSIS — I132 Hypertensive heart and chronic kidney disease with heart failure and with stage 5 chronic kidney disease, or end stage renal disease: Secondary | ICD-10-CM | POA: Diagnosis not present

## 2020-07-14 DIAGNOSIS — G9341 Metabolic encephalopathy: Secondary | ICD-10-CM | POA: Diagnosis not present

## 2020-07-14 DIAGNOSIS — F411 Generalized anxiety disorder: Secondary | ICD-10-CM | POA: Diagnosis not present

## 2020-07-14 DIAGNOSIS — N186 End stage renal disease: Secondary | ICD-10-CM | POA: Diagnosis not present

## 2020-07-14 DIAGNOSIS — M797 Fibromyalgia: Secondary | ICD-10-CM | POA: Diagnosis not present

## 2020-07-14 DIAGNOSIS — F32A Depression, unspecified: Secondary | ICD-10-CM | POA: Diagnosis not present

## 2020-07-14 DIAGNOSIS — Z794 Long term (current) use of insulin: Secondary | ICD-10-CM | POA: Diagnosis not present

## 2020-07-14 DIAGNOSIS — E1169 Type 2 diabetes mellitus with other specified complication: Secondary | ICD-10-CM | POA: Diagnosis not present

## 2020-07-14 DIAGNOSIS — Z9181 History of falling: Secondary | ICD-10-CM | POA: Diagnosis not present

## 2020-07-14 DIAGNOSIS — I69354 Hemiplegia and hemiparesis following cerebral infarction affecting left non-dominant side: Secondary | ICD-10-CM | POA: Diagnosis not present

## 2020-07-15 DIAGNOSIS — D631 Anemia in chronic kidney disease: Secondary | ICD-10-CM | POA: Diagnosis not present

## 2020-07-15 DIAGNOSIS — N186 End stage renal disease: Secondary | ICD-10-CM | POA: Diagnosis not present

## 2020-07-15 DIAGNOSIS — N2581 Secondary hyperparathyroidism of renal origin: Secondary | ICD-10-CM | POA: Diagnosis not present

## 2020-07-15 DIAGNOSIS — Z992 Dependence on renal dialysis: Secondary | ICD-10-CM | POA: Diagnosis not present

## 2020-07-15 DIAGNOSIS — D509 Iron deficiency anemia, unspecified: Secondary | ICD-10-CM | POA: Diagnosis not present

## 2020-07-16 DIAGNOSIS — N186 End stage renal disease: Secondary | ICD-10-CM | POA: Diagnosis not present

## 2020-07-16 DIAGNOSIS — I132 Hypertensive heart and chronic kidney disease with heart failure and with stage 5 chronic kidney disease, or end stage renal disease: Secondary | ICD-10-CM | POA: Diagnosis not present

## 2020-07-16 DIAGNOSIS — T8249XD Other complication of vascular dialysis catheter, subsequent encounter: Secondary | ICD-10-CM | POA: Diagnosis not present

## 2020-07-16 DIAGNOSIS — G9341 Metabolic encephalopathy: Secondary | ICD-10-CM | POA: Diagnosis not present

## 2020-07-16 DIAGNOSIS — E1122 Type 2 diabetes mellitus with diabetic chronic kidney disease: Secondary | ICD-10-CM | POA: Diagnosis not present

## 2020-07-16 DIAGNOSIS — I5033 Acute on chronic diastolic (congestive) heart failure: Secondary | ICD-10-CM | POA: Diagnosis not present

## 2020-07-17 DIAGNOSIS — D509 Iron deficiency anemia, unspecified: Secondary | ICD-10-CM | POA: Diagnosis not present

## 2020-07-17 DIAGNOSIS — N2581 Secondary hyperparathyroidism of renal origin: Secondary | ICD-10-CM | POA: Diagnosis not present

## 2020-07-17 DIAGNOSIS — D631 Anemia in chronic kidney disease: Secondary | ICD-10-CM | POA: Diagnosis not present

## 2020-07-17 DIAGNOSIS — Z992 Dependence on renal dialysis: Secondary | ICD-10-CM | POA: Diagnosis not present

## 2020-07-17 DIAGNOSIS — N186 End stage renal disease: Secondary | ICD-10-CM | POA: Diagnosis not present

## 2020-07-18 DIAGNOSIS — I5033 Acute on chronic diastolic (congestive) heart failure: Secondary | ICD-10-CM | POA: Diagnosis not present

## 2020-07-18 DIAGNOSIS — I132 Hypertensive heart and chronic kidney disease with heart failure and with stage 5 chronic kidney disease, or end stage renal disease: Secondary | ICD-10-CM | POA: Diagnosis not present

## 2020-07-18 DIAGNOSIS — T8249XD Other complication of vascular dialysis catheter, subsequent encounter: Secondary | ICD-10-CM | POA: Diagnosis not present

## 2020-07-18 DIAGNOSIS — N186 End stage renal disease: Secondary | ICD-10-CM | POA: Diagnosis not present

## 2020-07-18 DIAGNOSIS — E1122 Type 2 diabetes mellitus with diabetic chronic kidney disease: Secondary | ICD-10-CM | POA: Diagnosis not present

## 2020-07-18 DIAGNOSIS — G9341 Metabolic encephalopathy: Secondary | ICD-10-CM | POA: Diagnosis not present

## 2020-07-19 DIAGNOSIS — N186 End stage renal disease: Secondary | ICD-10-CM | POA: Diagnosis not present

## 2020-07-19 DIAGNOSIS — Z992 Dependence on renal dialysis: Secondary | ICD-10-CM | POA: Diagnosis not present

## 2020-07-19 DIAGNOSIS — D509 Iron deficiency anemia, unspecified: Secondary | ICD-10-CM | POA: Diagnosis not present

## 2020-07-19 DIAGNOSIS — D631 Anemia in chronic kidney disease: Secondary | ICD-10-CM | POA: Diagnosis not present

## 2020-07-19 DIAGNOSIS — N2581 Secondary hyperparathyroidism of renal origin: Secondary | ICD-10-CM | POA: Diagnosis not present

## 2020-07-21 DIAGNOSIS — T8241XA Breakdown (mechanical) of vascular dialysis catheter, initial encounter: Secondary | ICD-10-CM | POA: Diagnosis not present

## 2020-07-21 DIAGNOSIS — I5033 Acute on chronic diastolic (congestive) heart failure: Secondary | ICD-10-CM | POA: Diagnosis not present

## 2020-07-21 DIAGNOSIS — G9341 Metabolic encephalopathy: Secondary | ICD-10-CM | POA: Diagnosis not present

## 2020-07-21 DIAGNOSIS — I132 Hypertensive heart and chronic kidney disease with heart failure and with stage 5 chronic kidney disease, or end stage renal disease: Secondary | ICD-10-CM | POA: Diagnosis not present

## 2020-07-21 DIAGNOSIS — E1122 Type 2 diabetes mellitus with diabetic chronic kidney disease: Secondary | ICD-10-CM | POA: Diagnosis not present

## 2020-07-21 DIAGNOSIS — F411 Generalized anxiety disorder: Secondary | ICD-10-CM | POA: Diagnosis not present

## 2020-07-21 DIAGNOSIS — I6930 Unspecified sequelae of cerebral infarction: Secondary | ICD-10-CM | POA: Diagnosis not present

## 2020-07-21 DIAGNOSIS — N186 End stage renal disease: Secondary | ICD-10-CM | POA: Diagnosis not present

## 2020-07-21 DIAGNOSIS — I10 Essential (primary) hypertension: Secondary | ICD-10-CM | POA: Diagnosis not present

## 2020-07-21 DIAGNOSIS — T8249XD Other complication of vascular dialysis catheter, subsequent encounter: Secondary | ICD-10-CM | POA: Diagnosis not present

## 2020-07-22 DIAGNOSIS — Z992 Dependence on renal dialysis: Secondary | ICD-10-CM | POA: Diagnosis not present

## 2020-07-22 DIAGNOSIS — N2581 Secondary hyperparathyroidism of renal origin: Secondary | ICD-10-CM | POA: Diagnosis not present

## 2020-07-22 DIAGNOSIS — D509 Iron deficiency anemia, unspecified: Secondary | ICD-10-CM | POA: Diagnosis not present

## 2020-07-22 DIAGNOSIS — D631 Anemia in chronic kidney disease: Secondary | ICD-10-CM | POA: Diagnosis not present

## 2020-07-22 DIAGNOSIS — N186 End stage renal disease: Secondary | ICD-10-CM | POA: Diagnosis not present

## 2020-07-23 DIAGNOSIS — E1122 Type 2 diabetes mellitus with diabetic chronic kidney disease: Secondary | ICD-10-CM | POA: Diagnosis not present

## 2020-07-23 DIAGNOSIS — I5033 Acute on chronic diastolic (congestive) heart failure: Secondary | ICD-10-CM | POA: Diagnosis not present

## 2020-07-23 DIAGNOSIS — G9341 Metabolic encephalopathy: Secondary | ICD-10-CM | POA: Diagnosis not present

## 2020-07-23 DIAGNOSIS — T8249XD Other complication of vascular dialysis catheter, subsequent encounter: Secondary | ICD-10-CM | POA: Diagnosis not present

## 2020-07-23 DIAGNOSIS — I132 Hypertensive heart and chronic kidney disease with heart failure and with stage 5 chronic kidney disease, or end stage renal disease: Secondary | ICD-10-CM | POA: Diagnosis not present

## 2020-07-23 DIAGNOSIS — N186 End stage renal disease: Secondary | ICD-10-CM | POA: Diagnosis not present

## 2020-07-24 DIAGNOSIS — D509 Iron deficiency anemia, unspecified: Secondary | ICD-10-CM | POA: Diagnosis not present

## 2020-07-24 DIAGNOSIS — N186 End stage renal disease: Secondary | ICD-10-CM | POA: Diagnosis not present

## 2020-07-24 DIAGNOSIS — M7500 Adhesive capsulitis of unspecified shoulder: Secondary | ICD-10-CM | POA: Insufficient documentation

## 2020-07-24 DIAGNOSIS — D631 Anemia in chronic kidney disease: Secondary | ICD-10-CM | POA: Diagnosis not present

## 2020-07-24 DIAGNOSIS — Z992 Dependence on renal dialysis: Secondary | ICD-10-CM | POA: Diagnosis not present

## 2020-07-24 DIAGNOSIS — N2581 Secondary hyperparathyroidism of renal origin: Secondary | ICD-10-CM | POA: Diagnosis not present

## 2020-07-25 ENCOUNTER — Ambulatory Visit (INDEPENDENT_AMBULATORY_CARE_PROVIDER_SITE_OTHER): Payer: Medicare Other | Admitting: Physician Assistant

## 2020-07-25 ENCOUNTER — Other Ambulatory Visit: Payer: Self-pay

## 2020-07-25 VITALS — BP 107/67 | HR 82 | Temp 97.2°F

## 2020-07-25 DIAGNOSIS — Z992 Dependence on renal dialysis: Secondary | ICD-10-CM

## 2020-07-25 DIAGNOSIS — N186 End stage renal disease: Secondary | ICD-10-CM

## 2020-07-25 NOTE — Progress Notes (Signed)
POST OPERATIVE OFFICE NOTE    CC:  F/u for surgery  HPI:  This is a 70 y.o. female who is s/p Excision of right upper arm AV graft and stent and right IJ TDC placement on 07/03/20 by Dr. Donzetta Matters.  She  presented with ulceration of her right upper arm at the site of her previous graft.  It was not grossly infected.  She also has a malfunctioning tunneled dialysis catheter which she has been using for dialysis.    She has had multiple attempts at access in B UE.  Left first stage basilic which failed and also upper arm graft with early failure.  She has had right first stage basilic which failed followed by the right UE AV graft that was just removed.    Pt returns today for follow up and staple removal.  Allergies  Allergen Reactions  . Hydrochlorothiazide Nausea And Vomiting    Current Outpatient Medications  Medication Sig Dispense Refill  . acetaminophen (TYLENOL) 325 MG tablet Take 2 tablets (650 mg total) by mouth every 6 (six) hours as needed for mild pain (or Fever >/= 101).    Marland Kitchen amLODipine (NORVASC) 10 MG tablet Take 10 mg by mouth every evening.     Marland Kitchen atorvastatin (LIPITOR) 40 MG tablet Take 40 mg by mouth every evening.     . bismuth subsalicylate (PEPTO BISMOL) 262 MG/15ML suspension Take 30 mLs by mouth every 6 (six) hours as needed for indigestion.    . clonazePAM (KLONOPIN) 0.5 MG tablet Take 0.5 tablets (0.25 mg total) by mouth daily. 1 tablet 0  . clopidogrel (PLAVIX) 75 MG tablet Take 1 tablet (75 mg total) by mouth daily. (Patient taking differently: Take 75 mg by mouth every evening.)    . hydrALAZINE (APRESOLINE) 50 MG tablet Take 1 tablet (50 mg total) by mouth 3 (three) times daily. (Patient taking differently: Take 50 mg by mouth 2 (two) times daily.) 90 tablet 0  . Insulin Glargine (BASAGLAR KWIKPEN) 100 UNIT/ML SOPN Inject 15 Units into the skin daily.    Marland Kitchen loperamide (IMODIUM) 2 MG capsule Take 2 mg by mouth as needed for diarrhea or loose stools.    . methocarbamol  (ROBAXIN) 500 MG tablet Take 1 tablet (500 mg total) by mouth every 6 (six) hours as needed for muscle spasms. (Patient not taking: Reported on 04/16/2020) 30 tablet 0  . metoprolol succinate (TOPROL-XL) 100 MG 24 hr tablet Take 1 tablet (100 mg total) by mouth daily. (Patient taking differently: Take 100 mg by mouth every evening.) 30 tablet 0  . torsemide (DEMADEX) 20 MG tablet Take 40 mg by mouth daily.    Marland Kitchen venlafaxine XR (EFFEXOR-XR) 150 MG 24 hr capsule Take 150 mg by mouth daily.      No current facility-administered medications for this visit.   Facility-Administered Medications Ordered in Other Visits  Medication Dose Route Frequency Provider Last Rate Last Admin  . 0.9 %  sodium chloride infusion   Intravenous Continuous Monia Sabal, PA-C   New Bag at 09/06/18 1206     ROS:  See HPI  Physical Exam:    Incision:  Right UE incision has healed well Staples were removed and patient tolerated this well.   Extremities:  There is no sign of infection, palpable radial pulse right UE. Motor and sensation grossly intact Lungs: non labored breathing    Assessment/Plan:  This is a 70 y.o. female who is s/p:removal of right UE av Graft  with ulceration of  her right upper arm at the site of her previous graft.  It was not grossly infected.   I feel that her only option for permanent access would be a thigh graft.  The patient does not want to pursue more surgery at this time.  If she changes her mind she will call us.  She wants to continue HD via the right TDC.    F/U PRN  Roxy Horseman PA-C [facility] [phone]   Clinic MD:  Donzetta Matters

## 2020-07-26 DIAGNOSIS — D509 Iron deficiency anemia, unspecified: Secondary | ICD-10-CM | POA: Diagnosis not present

## 2020-07-26 DIAGNOSIS — N2581 Secondary hyperparathyroidism of renal origin: Secondary | ICD-10-CM | POA: Diagnosis not present

## 2020-07-26 DIAGNOSIS — D631 Anemia in chronic kidney disease: Secondary | ICD-10-CM | POA: Diagnosis not present

## 2020-07-26 DIAGNOSIS — Z992 Dependence on renal dialysis: Secondary | ICD-10-CM | POA: Diagnosis not present

## 2020-07-26 DIAGNOSIS — N186 End stage renal disease: Secondary | ICD-10-CM | POA: Diagnosis not present

## 2020-07-28 DIAGNOSIS — I132 Hypertensive heart and chronic kidney disease with heart failure and with stage 5 chronic kidney disease, or end stage renal disease: Secondary | ICD-10-CM | POA: Diagnosis not present

## 2020-07-28 DIAGNOSIS — N186 End stage renal disease: Secondary | ICD-10-CM | POA: Diagnosis not present

## 2020-07-28 DIAGNOSIS — G9341 Metabolic encephalopathy: Secondary | ICD-10-CM | POA: Diagnosis not present

## 2020-07-28 DIAGNOSIS — T8249XD Other complication of vascular dialysis catheter, subsequent encounter: Secondary | ICD-10-CM | POA: Diagnosis not present

## 2020-07-28 DIAGNOSIS — I5033 Acute on chronic diastolic (congestive) heart failure: Secondary | ICD-10-CM | POA: Diagnosis not present

## 2020-07-28 DIAGNOSIS — E1122 Type 2 diabetes mellitus with diabetic chronic kidney disease: Secondary | ICD-10-CM | POA: Diagnosis not present

## 2020-07-29 DIAGNOSIS — D509 Iron deficiency anemia, unspecified: Secondary | ICD-10-CM | POA: Diagnosis not present

## 2020-07-29 DIAGNOSIS — D631 Anemia in chronic kidney disease: Secondary | ICD-10-CM | POA: Diagnosis not present

## 2020-07-29 DIAGNOSIS — N186 End stage renal disease: Secondary | ICD-10-CM | POA: Diagnosis not present

## 2020-07-29 DIAGNOSIS — Z992 Dependence on renal dialysis: Secondary | ICD-10-CM | POA: Diagnosis not present

## 2020-07-29 DIAGNOSIS — N2581 Secondary hyperparathyroidism of renal origin: Secondary | ICD-10-CM | POA: Diagnosis not present

## 2020-07-30 ENCOUNTER — Ambulatory Visit: Payer: Medicare Other | Admitting: Vascular Surgery

## 2020-07-30 DIAGNOSIS — G9341 Metabolic encephalopathy: Secondary | ICD-10-CM | POA: Diagnosis not present

## 2020-07-30 DIAGNOSIS — I132 Hypertensive heart and chronic kidney disease with heart failure and with stage 5 chronic kidney disease, or end stage renal disease: Secondary | ICD-10-CM | POA: Diagnosis not present

## 2020-07-30 DIAGNOSIS — T8249XD Other complication of vascular dialysis catheter, subsequent encounter: Secondary | ICD-10-CM | POA: Diagnosis not present

## 2020-07-30 DIAGNOSIS — E1122 Type 2 diabetes mellitus with diabetic chronic kidney disease: Secondary | ICD-10-CM | POA: Diagnosis not present

## 2020-07-30 DIAGNOSIS — N186 End stage renal disease: Secondary | ICD-10-CM | POA: Diagnosis not present

## 2020-07-30 DIAGNOSIS — I5033 Acute on chronic diastolic (congestive) heart failure: Secondary | ICD-10-CM | POA: Diagnosis not present

## 2020-07-31 DIAGNOSIS — N2581 Secondary hyperparathyroidism of renal origin: Secondary | ICD-10-CM | POA: Diagnosis not present

## 2020-07-31 DIAGNOSIS — D509 Iron deficiency anemia, unspecified: Secondary | ICD-10-CM | POA: Diagnosis not present

## 2020-07-31 DIAGNOSIS — D631 Anemia in chronic kidney disease: Secondary | ICD-10-CM | POA: Diagnosis not present

## 2020-07-31 DIAGNOSIS — Z992 Dependence on renal dialysis: Secondary | ICD-10-CM | POA: Diagnosis not present

## 2020-07-31 DIAGNOSIS — N186 End stage renal disease: Secondary | ICD-10-CM | POA: Diagnosis not present

## 2020-08-02 DIAGNOSIS — N2581 Secondary hyperparathyroidism of renal origin: Secondary | ICD-10-CM | POA: Diagnosis not present

## 2020-08-02 DIAGNOSIS — D631 Anemia in chronic kidney disease: Secondary | ICD-10-CM | POA: Diagnosis not present

## 2020-08-02 DIAGNOSIS — Z992 Dependence on renal dialysis: Secondary | ICD-10-CM | POA: Diagnosis not present

## 2020-08-02 DIAGNOSIS — N186 End stage renal disease: Secondary | ICD-10-CM | POA: Diagnosis not present

## 2020-08-02 DIAGNOSIS — D509 Iron deficiency anemia, unspecified: Secondary | ICD-10-CM | POA: Diagnosis not present

## 2020-08-04 DIAGNOSIS — T8249XD Other complication of vascular dialysis catheter, subsequent encounter: Secondary | ICD-10-CM | POA: Diagnosis not present

## 2020-08-04 DIAGNOSIS — I132 Hypertensive heart and chronic kidney disease with heart failure and with stage 5 chronic kidney disease, or end stage renal disease: Secondary | ICD-10-CM | POA: Diagnosis not present

## 2020-08-04 DIAGNOSIS — G9341 Metabolic encephalopathy: Secondary | ICD-10-CM | POA: Diagnosis not present

## 2020-08-04 DIAGNOSIS — E1122 Type 2 diabetes mellitus with diabetic chronic kidney disease: Secondary | ICD-10-CM | POA: Diagnosis not present

## 2020-08-04 DIAGNOSIS — N186 End stage renal disease: Secondary | ICD-10-CM | POA: Diagnosis not present

## 2020-08-04 DIAGNOSIS — I5033 Acute on chronic diastolic (congestive) heart failure: Secondary | ICD-10-CM | POA: Diagnosis not present

## 2020-08-05 DIAGNOSIS — N186 End stage renal disease: Secondary | ICD-10-CM | POA: Diagnosis not present

## 2020-08-05 DIAGNOSIS — D509 Iron deficiency anemia, unspecified: Secondary | ICD-10-CM | POA: Diagnosis not present

## 2020-08-05 DIAGNOSIS — Z992 Dependence on renal dialysis: Secondary | ICD-10-CM | POA: Diagnosis not present

## 2020-08-05 DIAGNOSIS — N2581 Secondary hyperparathyroidism of renal origin: Secondary | ICD-10-CM | POA: Diagnosis not present

## 2020-08-05 DIAGNOSIS — D631 Anemia in chronic kidney disease: Secondary | ICD-10-CM | POA: Diagnosis not present

## 2020-08-06 DIAGNOSIS — I132 Hypertensive heart and chronic kidney disease with heart failure and with stage 5 chronic kidney disease, or end stage renal disease: Secondary | ICD-10-CM | POA: Diagnosis not present

## 2020-08-06 DIAGNOSIS — T8249XD Other complication of vascular dialysis catheter, subsequent encounter: Secondary | ICD-10-CM | POA: Diagnosis not present

## 2020-08-06 DIAGNOSIS — E1122 Type 2 diabetes mellitus with diabetic chronic kidney disease: Secondary | ICD-10-CM | POA: Diagnosis not present

## 2020-08-06 DIAGNOSIS — I5033 Acute on chronic diastolic (congestive) heart failure: Secondary | ICD-10-CM | POA: Diagnosis not present

## 2020-08-06 DIAGNOSIS — N186 End stage renal disease: Secondary | ICD-10-CM | POA: Diagnosis not present

## 2020-08-06 DIAGNOSIS — G9341 Metabolic encephalopathy: Secondary | ICD-10-CM | POA: Diagnosis not present

## 2020-08-07 DIAGNOSIS — N2581 Secondary hyperparathyroidism of renal origin: Secondary | ICD-10-CM | POA: Diagnosis not present

## 2020-08-07 DIAGNOSIS — N186 End stage renal disease: Secondary | ICD-10-CM | POA: Diagnosis not present

## 2020-08-07 DIAGNOSIS — Z992 Dependence on renal dialysis: Secondary | ICD-10-CM | POA: Diagnosis not present

## 2020-08-07 DIAGNOSIS — D631 Anemia in chronic kidney disease: Secondary | ICD-10-CM | POA: Diagnosis not present

## 2020-08-07 DIAGNOSIS — D509 Iron deficiency anemia, unspecified: Secondary | ICD-10-CM | POA: Diagnosis not present

## 2020-08-09 DIAGNOSIS — D509 Iron deficiency anemia, unspecified: Secondary | ICD-10-CM | POA: Diagnosis not present

## 2020-08-09 DIAGNOSIS — D631 Anemia in chronic kidney disease: Secondary | ICD-10-CM | POA: Diagnosis not present

## 2020-08-09 DIAGNOSIS — N2581 Secondary hyperparathyroidism of renal origin: Secondary | ICD-10-CM | POA: Diagnosis not present

## 2020-08-09 DIAGNOSIS — Z992 Dependence on renal dialysis: Secondary | ICD-10-CM | POA: Diagnosis not present

## 2020-08-09 DIAGNOSIS — N186 End stage renal disease: Secondary | ICD-10-CM | POA: Diagnosis not present

## 2020-08-12 DIAGNOSIS — N2581 Secondary hyperparathyroidism of renal origin: Secondary | ICD-10-CM | POA: Diagnosis not present

## 2020-08-12 DIAGNOSIS — D631 Anemia in chronic kidney disease: Secondary | ICD-10-CM | POA: Diagnosis not present

## 2020-08-12 DIAGNOSIS — Z992 Dependence on renal dialysis: Secondary | ICD-10-CM | POA: Diagnosis not present

## 2020-08-12 DIAGNOSIS — D509 Iron deficiency anemia, unspecified: Secondary | ICD-10-CM | POA: Diagnosis not present

## 2020-08-12 DIAGNOSIS — N186 End stage renal disease: Secondary | ICD-10-CM | POA: Diagnosis not present

## 2020-08-13 DIAGNOSIS — F32A Depression, unspecified: Secondary | ICD-10-CM | POA: Diagnosis not present

## 2020-08-13 DIAGNOSIS — I132 Hypertensive heart and chronic kidney disease with heart failure and with stage 5 chronic kidney disease, or end stage renal disease: Secondary | ICD-10-CM | POA: Diagnosis not present

## 2020-08-13 DIAGNOSIS — E782 Mixed hyperlipidemia: Secondary | ICD-10-CM | POA: Diagnosis not present

## 2020-08-13 DIAGNOSIS — G9341 Metabolic encephalopathy: Secondary | ICD-10-CM | POA: Diagnosis not present

## 2020-08-13 DIAGNOSIS — I69354 Hemiplegia and hemiparesis following cerebral infarction affecting left non-dominant side: Secondary | ICD-10-CM | POA: Diagnosis not present

## 2020-08-13 DIAGNOSIS — T8249XD Other complication of vascular dialysis catheter, subsequent encounter: Secondary | ICD-10-CM | POA: Diagnosis not present

## 2020-08-13 DIAGNOSIS — F411 Generalized anxiety disorder: Secondary | ICD-10-CM | POA: Diagnosis not present

## 2020-08-13 DIAGNOSIS — N186 End stage renal disease: Secondary | ICD-10-CM | POA: Diagnosis not present

## 2020-08-13 DIAGNOSIS — D631 Anemia in chronic kidney disease: Secondary | ICD-10-CM | POA: Diagnosis not present

## 2020-08-13 DIAGNOSIS — M797 Fibromyalgia: Secondary | ICD-10-CM | POA: Diagnosis not present

## 2020-08-13 DIAGNOSIS — Z992 Dependence on renal dialysis: Secondary | ICD-10-CM | POA: Diagnosis not present

## 2020-08-13 DIAGNOSIS — Z7902 Long term (current) use of antithrombotics/antiplatelets: Secondary | ICD-10-CM | POA: Diagnosis not present

## 2020-08-13 DIAGNOSIS — E1122 Type 2 diabetes mellitus with diabetic chronic kidney disease: Secondary | ICD-10-CM | POA: Diagnosis not present

## 2020-08-13 DIAGNOSIS — I5033 Acute on chronic diastolic (congestive) heart failure: Secondary | ICD-10-CM | POA: Diagnosis not present

## 2020-08-13 DIAGNOSIS — Z794 Long term (current) use of insulin: Secondary | ICD-10-CM | POA: Diagnosis not present

## 2020-08-13 DIAGNOSIS — E1169 Type 2 diabetes mellitus with other specified complication: Secondary | ICD-10-CM | POA: Diagnosis not present

## 2020-08-13 DIAGNOSIS — Z9181 History of falling: Secondary | ICD-10-CM | POA: Diagnosis not present

## 2020-08-13 DIAGNOSIS — M1990 Unspecified osteoarthritis, unspecified site: Secondary | ICD-10-CM | POA: Diagnosis not present

## 2020-08-15 DIAGNOSIS — Z992 Dependence on renal dialysis: Secondary | ICD-10-CM | POA: Diagnosis not present

## 2020-08-15 DIAGNOSIS — T82898A Other specified complication of vascular prosthetic devices, implants and grafts, initial encounter: Secondary | ICD-10-CM | POA: Diagnosis not present

## 2020-08-15 DIAGNOSIS — N186 End stage renal disease: Secondary | ICD-10-CM | POA: Diagnosis not present

## 2020-08-15 DIAGNOSIS — D631 Anemia in chronic kidney disease: Secondary | ICD-10-CM | POA: Diagnosis not present

## 2020-08-15 DIAGNOSIS — D509 Iron deficiency anemia, unspecified: Secondary | ICD-10-CM | POA: Diagnosis not present

## 2020-08-15 DIAGNOSIS — N2581 Secondary hyperparathyroidism of renal origin: Secondary | ICD-10-CM | POA: Diagnosis not present

## 2020-08-15 DIAGNOSIS — I871 Compression of vein: Secondary | ICD-10-CM | POA: Diagnosis not present

## 2020-08-16 DIAGNOSIS — D509 Iron deficiency anemia, unspecified: Secondary | ICD-10-CM | POA: Diagnosis not present

## 2020-08-16 DIAGNOSIS — N186 End stage renal disease: Secondary | ICD-10-CM | POA: Diagnosis not present

## 2020-08-16 DIAGNOSIS — N2581 Secondary hyperparathyroidism of renal origin: Secondary | ICD-10-CM | POA: Diagnosis not present

## 2020-08-16 DIAGNOSIS — Z992 Dependence on renal dialysis: Secondary | ICD-10-CM | POA: Diagnosis not present

## 2020-08-16 DIAGNOSIS — D631 Anemia in chronic kidney disease: Secondary | ICD-10-CM | POA: Diagnosis not present

## 2020-08-18 DIAGNOSIS — I5033 Acute on chronic diastolic (congestive) heart failure: Secondary | ICD-10-CM | POA: Diagnosis not present

## 2020-08-18 DIAGNOSIS — I132 Hypertensive heart and chronic kidney disease with heart failure and with stage 5 chronic kidney disease, or end stage renal disease: Secondary | ICD-10-CM | POA: Diagnosis not present

## 2020-08-18 DIAGNOSIS — N186 End stage renal disease: Secondary | ICD-10-CM | POA: Diagnosis not present

## 2020-08-18 DIAGNOSIS — G9341 Metabolic encephalopathy: Secondary | ICD-10-CM | POA: Diagnosis not present

## 2020-08-18 DIAGNOSIS — E1122 Type 2 diabetes mellitus with diabetic chronic kidney disease: Secondary | ICD-10-CM | POA: Diagnosis not present

## 2020-08-18 DIAGNOSIS — T8249XD Other complication of vascular dialysis catheter, subsequent encounter: Secondary | ICD-10-CM | POA: Diagnosis not present

## 2020-08-19 DIAGNOSIS — D631 Anemia in chronic kidney disease: Secondary | ICD-10-CM | POA: Diagnosis not present

## 2020-08-19 DIAGNOSIS — D509 Iron deficiency anemia, unspecified: Secondary | ICD-10-CM | POA: Diagnosis not present

## 2020-08-19 DIAGNOSIS — N186 End stage renal disease: Secondary | ICD-10-CM | POA: Diagnosis not present

## 2020-08-19 DIAGNOSIS — N2581 Secondary hyperparathyroidism of renal origin: Secondary | ICD-10-CM | POA: Diagnosis not present

## 2020-08-19 DIAGNOSIS — Z992 Dependence on renal dialysis: Secondary | ICD-10-CM | POA: Diagnosis not present

## 2020-08-20 DIAGNOSIS — G9341 Metabolic encephalopathy: Secondary | ICD-10-CM | POA: Diagnosis not present

## 2020-08-20 DIAGNOSIS — I5033 Acute on chronic diastolic (congestive) heart failure: Secondary | ICD-10-CM | POA: Diagnosis not present

## 2020-08-20 DIAGNOSIS — T8249XD Other complication of vascular dialysis catheter, subsequent encounter: Secondary | ICD-10-CM | POA: Diagnosis not present

## 2020-08-20 DIAGNOSIS — I132 Hypertensive heart and chronic kidney disease with heart failure and with stage 5 chronic kidney disease, or end stage renal disease: Secondary | ICD-10-CM | POA: Diagnosis not present

## 2020-08-20 DIAGNOSIS — N186 End stage renal disease: Secondary | ICD-10-CM | POA: Diagnosis not present

## 2020-08-20 DIAGNOSIS — E1122 Type 2 diabetes mellitus with diabetic chronic kidney disease: Secondary | ICD-10-CM | POA: Diagnosis not present

## 2020-08-21 ENCOUNTER — Other Ambulatory Visit: Payer: Self-pay | Admitting: Radiology

## 2020-08-21 ENCOUNTER — Other Ambulatory Visit (HOSPITAL_COMMUNITY): Payer: Self-pay | Admitting: Medical

## 2020-08-21 DIAGNOSIS — N186 End stage renal disease: Secondary | ICD-10-CM | POA: Diagnosis not present

## 2020-08-21 DIAGNOSIS — D631 Anemia in chronic kidney disease: Secondary | ICD-10-CM | POA: Diagnosis not present

## 2020-08-21 DIAGNOSIS — Z992 Dependence on renal dialysis: Secondary | ICD-10-CM | POA: Diagnosis not present

## 2020-08-21 DIAGNOSIS — D509 Iron deficiency anemia, unspecified: Secondary | ICD-10-CM | POA: Diagnosis not present

## 2020-08-21 DIAGNOSIS — N2581 Secondary hyperparathyroidism of renal origin: Secondary | ICD-10-CM | POA: Diagnosis not present

## 2020-08-21 DIAGNOSIS — T8241XA Breakdown (mechanical) of vascular dialysis catheter, initial encounter: Secondary | ICD-10-CM

## 2020-08-22 ENCOUNTER — Ambulatory Visit (HOSPITAL_COMMUNITY)
Admission: RE | Admit: 2020-08-22 | Discharge: 2020-08-22 | Disposition: A | Discharge status: Home / Self Care | Payer: Medicare Other | Source: Ambulatory Visit | Attending: Medical | Admitting: Medical

## 2020-08-22 ENCOUNTER — Other Ambulatory Visit: Payer: Self-pay

## 2020-08-22 ENCOUNTER — Other Ambulatory Visit (HOSPITAL_COMMUNITY): Payer: Self-pay | Admitting: Medical

## 2020-08-22 ENCOUNTER — Encounter (HOSPITAL_COMMUNITY): Payer: Self-pay | Admitting: Interventional Radiology

## 2020-08-22 DIAGNOSIS — T8249XA Other complication of vascular dialysis catheter, initial encounter: Secondary | ICD-10-CM | POA: Diagnosis not present

## 2020-08-22 DIAGNOSIS — T8241XA Breakdown (mechanical) of vascular dialysis catheter, initial encounter: Secondary | ICD-10-CM

## 2020-08-22 DIAGNOSIS — Y841 Kidney dialysis as the cause of abnormal reaction of the patient, or of later complication, without mention of misadventure at the time of the procedure: Secondary | ICD-10-CM | POA: Insufficient documentation

## 2020-08-22 HISTORY — PX: IR FLUORO GUIDE CV LINE RIGHT: IMG2283

## 2020-08-22 HISTORY — PX: IR PTA VENOUS EXCEPT DIALYSIS CIRCUIT: IMG6126

## 2020-08-22 MED ORDER — LIDOCAINE HCL 1 % IJ SOLN
INTRAMUSCULAR | Status: AC
  Filled 2020-08-22: qty 20

## 2020-08-22 MED ORDER — LIDOCAINE-EPINEPHRINE 1 %-1:100000 IJ SOLN
INTRAMUSCULAR | Status: AC
  Filled 2020-08-22: qty 1

## 2020-08-22 MED ORDER — CEFAZOLIN SODIUM-DEXTROSE 2-4 GM/100ML-% IV SOLN
INTRAVENOUS | Status: AC
  Administered 2020-08-22: 2 g via INTRAVENOUS
  Filled 2020-08-22: qty 100

## 2020-08-22 MED ORDER — IOHEXOL 300 MG/ML  SOLN: 50.0000 mL | Freq: Once | INTRAMUSCULAR | Status: DC | PRN

## 2020-08-22 MED ORDER — HEPARIN SODIUM (PORCINE) 1000 UNIT/ML IJ SOLN
INTRAMUSCULAR | Status: AC
  Filled 2020-08-22: qty 1

## 2020-08-22 MED ORDER — CEFAZOLIN SODIUM-DEXTROSE 2-4 GM/100ML-% IV SOLN: 2.0000 g | INTRAVENOUS | Status: AC

## 2020-08-22 MED ORDER — CHLORHEXIDINE GLUCONATE 4 % EX LIQD
CUTANEOUS | Status: AC
  Filled 2020-08-22: qty 15

## 2020-08-22 NOTE — Procedures (Signed)
  Procedure: R tunneed HD CVC revision PTA fibrin sheath 25mm EBL:   minimal Complications:  none immediate  See full dictation in [facility].  Dillard Cannon MD Main # [phone] Pager  [phone]

## 2020-08-23 DIAGNOSIS — D631 Anemia in chronic kidney disease: Secondary | ICD-10-CM | POA: Diagnosis not present

## 2020-08-23 DIAGNOSIS — D509 Iron deficiency anemia, unspecified: Secondary | ICD-10-CM | POA: Diagnosis not present

## 2020-08-23 DIAGNOSIS — N186 End stage renal disease: Secondary | ICD-10-CM | POA: Diagnosis not present

## 2020-08-23 DIAGNOSIS — N2581 Secondary hyperparathyroidism of renal origin: Secondary | ICD-10-CM | POA: Diagnosis not present

## 2020-08-23 DIAGNOSIS — Z992 Dependence on renal dialysis: Secondary | ICD-10-CM | POA: Diagnosis not present

## 2020-08-25 DIAGNOSIS — E1122 Type 2 diabetes mellitus with diabetic chronic kidney disease: Secondary | ICD-10-CM | POA: Diagnosis not present

## 2020-08-25 DIAGNOSIS — I132 Hypertensive heart and chronic kidney disease with heart failure and with stage 5 chronic kidney disease, or end stage renal disease: Secondary | ICD-10-CM | POA: Diagnosis not present

## 2020-08-25 DIAGNOSIS — T8249XD Other complication of vascular dialysis catheter, subsequent encounter: Secondary | ICD-10-CM | POA: Diagnosis not present

## 2020-08-25 DIAGNOSIS — G9341 Metabolic encephalopathy: Secondary | ICD-10-CM | POA: Diagnosis not present

## 2020-08-25 DIAGNOSIS — N186 End stage renal disease: Secondary | ICD-10-CM | POA: Diagnosis not present

## 2020-08-25 DIAGNOSIS — I5033 Acute on chronic diastolic (congestive) heart failure: Secondary | ICD-10-CM | POA: Diagnosis not present

## 2020-08-26 DIAGNOSIS — Z992 Dependence on renal dialysis: Secondary | ICD-10-CM | POA: Diagnosis not present

## 2020-08-26 DIAGNOSIS — N2581 Secondary hyperparathyroidism of renal origin: Secondary | ICD-10-CM | POA: Diagnosis not present

## 2020-08-26 DIAGNOSIS — D509 Iron deficiency anemia, unspecified: Secondary | ICD-10-CM | POA: Diagnosis not present

## 2020-08-26 DIAGNOSIS — E119 Type 2 diabetes mellitus without complications: Secondary | ICD-10-CM | POA: Diagnosis not present

## 2020-08-26 DIAGNOSIS — D631 Anemia in chronic kidney disease: Secondary | ICD-10-CM | POA: Diagnosis not present

## 2020-08-26 DIAGNOSIS — N186 End stage renal disease: Secondary | ICD-10-CM | POA: Diagnosis not present

## 2020-08-27 DIAGNOSIS — G9341 Metabolic encephalopathy: Secondary | ICD-10-CM | POA: Diagnosis not present

## 2020-08-27 DIAGNOSIS — T8249XD Other complication of vascular dialysis catheter, subsequent encounter: Secondary | ICD-10-CM | POA: Diagnosis not present

## 2020-08-27 DIAGNOSIS — I132 Hypertensive heart and chronic kidney disease with heart failure and with stage 5 chronic kidney disease, or end stage renal disease: Secondary | ICD-10-CM | POA: Diagnosis not present

## 2020-08-27 DIAGNOSIS — I5033 Acute on chronic diastolic (congestive) heart failure: Secondary | ICD-10-CM | POA: Diagnosis not present

## 2020-08-27 DIAGNOSIS — E1122 Type 2 diabetes mellitus with diabetic chronic kidney disease: Secondary | ICD-10-CM | POA: Diagnosis not present

## 2020-08-27 DIAGNOSIS — N186 End stage renal disease: Secondary | ICD-10-CM | POA: Diagnosis not present

## 2020-08-28 DIAGNOSIS — N2581 Secondary hyperparathyroidism of renal origin: Secondary | ICD-10-CM | POA: Diagnosis not present

## 2020-08-28 DIAGNOSIS — Z992 Dependence on renal dialysis: Secondary | ICD-10-CM | POA: Diagnosis not present

## 2020-08-28 DIAGNOSIS — D631 Anemia in chronic kidney disease: Secondary | ICD-10-CM | POA: Diagnosis not present

## 2020-08-28 DIAGNOSIS — D509 Iron deficiency anemia, unspecified: Secondary | ICD-10-CM | POA: Diagnosis not present

## 2020-08-28 DIAGNOSIS — N186 End stage renal disease: Secondary | ICD-10-CM | POA: Diagnosis not present

## 2020-08-30 DIAGNOSIS — Z992 Dependence on renal dialysis: Secondary | ICD-10-CM | POA: Diagnosis not present

## 2020-08-30 DIAGNOSIS — D631 Anemia in chronic kidney disease: Secondary | ICD-10-CM | POA: Diagnosis not present

## 2020-08-30 DIAGNOSIS — N250 Renal osteodystrophy: Secondary | ICD-10-CM | POA: Diagnosis not present

## 2020-08-30 DIAGNOSIS — D509 Iron deficiency anemia, unspecified: Secondary | ICD-10-CM | POA: Diagnosis not present

## 2020-08-30 DIAGNOSIS — N186 End stage renal disease: Secondary | ICD-10-CM | POA: Diagnosis not present

## 2020-08-30 DIAGNOSIS — N2581 Secondary hyperparathyroidism of renal origin: Secondary | ICD-10-CM | POA: Diagnosis not present

## 2020-09-01 DIAGNOSIS — I132 Hypertensive heart and chronic kidney disease with heart failure and with stage 5 chronic kidney disease, or end stage renal disease: Secondary | ICD-10-CM | POA: Diagnosis not present

## 2020-09-01 DIAGNOSIS — N186 End stage renal disease: Secondary | ICD-10-CM | POA: Diagnosis not present

## 2020-09-01 DIAGNOSIS — I5033 Acute on chronic diastolic (congestive) heart failure: Secondary | ICD-10-CM | POA: Diagnosis not present

## 2020-09-01 DIAGNOSIS — E1122 Type 2 diabetes mellitus with diabetic chronic kidney disease: Secondary | ICD-10-CM | POA: Diagnosis not present

## 2020-09-01 DIAGNOSIS — T8249XD Other complication of vascular dialysis catheter, subsequent encounter: Secondary | ICD-10-CM | POA: Diagnosis not present

## 2020-09-01 DIAGNOSIS — G9341 Metabolic encephalopathy: Secondary | ICD-10-CM | POA: Diagnosis not present

## 2020-09-02 DIAGNOSIS — N2581 Secondary hyperparathyroidism of renal origin: Secondary | ICD-10-CM | POA: Diagnosis not present

## 2020-09-02 DIAGNOSIS — N186 End stage renal disease: Secondary | ICD-10-CM | POA: Diagnosis not present

## 2020-09-02 DIAGNOSIS — D631 Anemia in chronic kidney disease: Secondary | ICD-10-CM | POA: Diagnosis not present

## 2020-09-02 DIAGNOSIS — D509 Iron deficiency anemia, unspecified: Secondary | ICD-10-CM | POA: Diagnosis not present

## 2020-09-02 DIAGNOSIS — N250 Renal osteodystrophy: Secondary | ICD-10-CM | POA: Diagnosis not present

## 2020-09-02 DIAGNOSIS — Z992 Dependence on renal dialysis: Secondary | ICD-10-CM | POA: Diagnosis not present

## 2020-09-04 DIAGNOSIS — D631 Anemia in chronic kidney disease: Secondary | ICD-10-CM | POA: Diagnosis not present

## 2020-09-04 DIAGNOSIS — N2581 Secondary hyperparathyroidism of renal origin: Secondary | ICD-10-CM | POA: Diagnosis not present

## 2020-09-04 DIAGNOSIS — N186 End stage renal disease: Secondary | ICD-10-CM | POA: Diagnosis not present

## 2020-09-04 DIAGNOSIS — Z992 Dependence on renal dialysis: Secondary | ICD-10-CM | POA: Diagnosis not present

## 2020-09-04 DIAGNOSIS — N250 Renal osteodystrophy: Secondary | ICD-10-CM | POA: Diagnosis not present

## 2020-09-04 DIAGNOSIS — D509 Iron deficiency anemia, unspecified: Secondary | ICD-10-CM | POA: Diagnosis not present

## 2020-09-06 DIAGNOSIS — N2581 Secondary hyperparathyroidism of renal origin: Secondary | ICD-10-CM | POA: Diagnosis not present

## 2020-09-06 DIAGNOSIS — D631 Anemia in chronic kidney disease: Secondary | ICD-10-CM | POA: Diagnosis not present

## 2020-09-06 DIAGNOSIS — Z992 Dependence on renal dialysis: Secondary | ICD-10-CM | POA: Diagnosis not present

## 2020-09-06 DIAGNOSIS — D509 Iron deficiency anemia, unspecified: Secondary | ICD-10-CM | POA: Diagnosis not present

## 2020-09-06 DIAGNOSIS — N250 Renal osteodystrophy: Secondary | ICD-10-CM | POA: Diagnosis not present

## 2020-09-06 DIAGNOSIS — N186 End stage renal disease: Secondary | ICD-10-CM | POA: Diagnosis not present

## 2020-09-09 DIAGNOSIS — N250 Renal osteodystrophy: Secondary | ICD-10-CM | POA: Diagnosis not present

## 2020-09-09 DIAGNOSIS — N186 End stage renal disease: Secondary | ICD-10-CM | POA: Diagnosis not present

## 2020-09-09 DIAGNOSIS — N2581 Secondary hyperparathyroidism of renal origin: Secondary | ICD-10-CM | POA: Diagnosis not present

## 2020-09-09 DIAGNOSIS — D631 Anemia in chronic kidney disease: Secondary | ICD-10-CM | POA: Diagnosis not present

## 2020-09-09 DIAGNOSIS — D509 Iron deficiency anemia, unspecified: Secondary | ICD-10-CM | POA: Diagnosis not present

## 2020-09-09 DIAGNOSIS — Z992 Dependence on renal dialysis: Secondary | ICD-10-CM | POA: Diagnosis not present

## 2020-09-10 DIAGNOSIS — Z992 Dependence on renal dialysis: Secondary | ICD-10-CM | POA: Diagnosis not present

## 2020-09-10 DIAGNOSIS — N186 End stage renal disease: Secondary | ICD-10-CM | POA: Diagnosis not present

## 2020-09-10 DIAGNOSIS — T8249XA Other complication of vascular dialysis catheter, initial encounter: Secondary | ICD-10-CM | POA: Diagnosis not present

## 2020-09-11 DIAGNOSIS — N250 Renal osteodystrophy: Secondary | ICD-10-CM | POA: Diagnosis not present

## 2020-09-11 DIAGNOSIS — Z992 Dependence on renal dialysis: Secondary | ICD-10-CM | POA: Diagnosis not present

## 2020-09-11 DIAGNOSIS — D631 Anemia in chronic kidney disease: Secondary | ICD-10-CM | POA: Diagnosis not present

## 2020-09-11 DIAGNOSIS — N2581 Secondary hyperparathyroidism of renal origin: Secondary | ICD-10-CM | POA: Diagnosis not present

## 2020-09-11 DIAGNOSIS — D509 Iron deficiency anemia, unspecified: Secondary | ICD-10-CM | POA: Diagnosis not present

## 2020-09-11 DIAGNOSIS — N186 End stage renal disease: Secondary | ICD-10-CM | POA: Diagnosis not present

## 2020-09-13 DIAGNOSIS — D509 Iron deficiency anemia, unspecified: Secondary | ICD-10-CM | POA: Diagnosis not present

## 2020-09-13 DIAGNOSIS — N186 End stage renal disease: Secondary | ICD-10-CM | POA: Diagnosis not present

## 2020-09-13 DIAGNOSIS — N2581 Secondary hyperparathyroidism of renal origin: Secondary | ICD-10-CM | POA: Diagnosis not present

## 2020-09-13 DIAGNOSIS — D631 Anemia in chronic kidney disease: Secondary | ICD-10-CM | POA: Diagnosis not present

## 2020-09-13 DIAGNOSIS — N250 Renal osteodystrophy: Secondary | ICD-10-CM | POA: Diagnosis not present

## 2020-09-13 DIAGNOSIS — Z992 Dependence on renal dialysis: Secondary | ICD-10-CM | POA: Diagnosis not present

## 2020-09-16 DIAGNOSIS — Z992 Dependence on renal dialysis: Secondary | ICD-10-CM | POA: Diagnosis not present

## 2020-09-16 DIAGNOSIS — N250 Renal osteodystrophy: Secondary | ICD-10-CM | POA: Diagnosis not present

## 2020-09-16 DIAGNOSIS — N2581 Secondary hyperparathyroidism of renal origin: Secondary | ICD-10-CM | POA: Diagnosis not present

## 2020-09-16 DIAGNOSIS — D631 Anemia in chronic kidney disease: Secondary | ICD-10-CM | POA: Diagnosis not present

## 2020-09-16 DIAGNOSIS — D509 Iron deficiency anemia, unspecified: Secondary | ICD-10-CM | POA: Diagnosis not present

## 2020-09-16 DIAGNOSIS — N186 End stage renal disease: Secondary | ICD-10-CM | POA: Diagnosis not present

## 2020-09-18 DIAGNOSIS — Z992 Dependence on renal dialysis: Secondary | ICD-10-CM | POA: Diagnosis not present

## 2020-09-18 DIAGNOSIS — N186 End stage renal disease: Secondary | ICD-10-CM | POA: Diagnosis not present

## 2020-09-18 DIAGNOSIS — D509 Iron deficiency anemia, unspecified: Secondary | ICD-10-CM | POA: Diagnosis not present

## 2020-09-18 DIAGNOSIS — D631 Anemia in chronic kidney disease: Secondary | ICD-10-CM | POA: Diagnosis not present

## 2020-09-18 DIAGNOSIS — N2581 Secondary hyperparathyroidism of renal origin: Secondary | ICD-10-CM | POA: Diagnosis not present

## 2020-09-18 DIAGNOSIS — N250 Renal osteodystrophy: Secondary | ICD-10-CM | POA: Diagnosis not present

## 2020-09-20 DIAGNOSIS — N2581 Secondary hyperparathyroidism of renal origin: Secondary | ICD-10-CM | POA: Diagnosis not present

## 2020-09-20 DIAGNOSIS — N186 End stage renal disease: Secondary | ICD-10-CM | POA: Diagnosis not present

## 2020-09-20 DIAGNOSIS — Z992 Dependence on renal dialysis: Secondary | ICD-10-CM | POA: Diagnosis not present

## 2020-09-20 DIAGNOSIS — D631 Anemia in chronic kidney disease: Secondary | ICD-10-CM | POA: Diagnosis not present

## 2020-09-20 DIAGNOSIS — N250 Renal osteodystrophy: Secondary | ICD-10-CM | POA: Diagnosis not present

## 2020-09-20 DIAGNOSIS — D509 Iron deficiency anemia, unspecified: Secondary | ICD-10-CM | POA: Diagnosis not present

## 2020-09-23 DIAGNOSIS — N2581 Secondary hyperparathyroidism of renal origin: Secondary | ICD-10-CM | POA: Diagnosis not present

## 2020-09-23 DIAGNOSIS — D509 Iron deficiency anemia, unspecified: Secondary | ICD-10-CM | POA: Diagnosis not present

## 2020-09-23 DIAGNOSIS — N250 Renal osteodystrophy: Secondary | ICD-10-CM | POA: Diagnosis not present

## 2020-09-23 DIAGNOSIS — N186 End stage renal disease: Secondary | ICD-10-CM | POA: Diagnosis not present

## 2020-09-23 DIAGNOSIS — Z992 Dependence on renal dialysis: Secondary | ICD-10-CM | POA: Diagnosis not present

## 2020-09-23 DIAGNOSIS — D631 Anemia in chronic kidney disease: Secondary | ICD-10-CM | POA: Diagnosis not present

## 2020-09-25 DIAGNOSIS — N186 End stage renal disease: Secondary | ICD-10-CM | POA: Diagnosis not present

## 2020-09-25 DIAGNOSIS — Z992 Dependence on renal dialysis: Secondary | ICD-10-CM | POA: Diagnosis not present

## 2020-09-25 DIAGNOSIS — N2581 Secondary hyperparathyroidism of renal origin: Secondary | ICD-10-CM | POA: Diagnosis not present

## 2020-09-25 DIAGNOSIS — N250 Renal osteodystrophy: Secondary | ICD-10-CM | POA: Diagnosis not present

## 2020-09-25 DIAGNOSIS — D509 Iron deficiency anemia, unspecified: Secondary | ICD-10-CM | POA: Diagnosis not present

## 2020-09-25 DIAGNOSIS — D631 Anemia in chronic kidney disease: Secondary | ICD-10-CM | POA: Diagnosis not present

## 2020-09-27 DIAGNOSIS — D509 Iron deficiency anemia, unspecified: Secondary | ICD-10-CM | POA: Diagnosis not present

## 2020-09-27 DIAGNOSIS — Z992 Dependence on renal dialysis: Secondary | ICD-10-CM | POA: Diagnosis not present

## 2020-09-27 DIAGNOSIS — N186 End stage renal disease: Secondary | ICD-10-CM | POA: Diagnosis not present

## 2020-09-27 DIAGNOSIS — N250 Renal osteodystrophy: Secondary | ICD-10-CM | POA: Diagnosis not present

## 2020-09-27 DIAGNOSIS — D631 Anemia in chronic kidney disease: Secondary | ICD-10-CM | POA: Diagnosis not present

## 2020-09-27 DIAGNOSIS — N2581 Secondary hyperparathyroidism of renal origin: Secondary | ICD-10-CM | POA: Diagnosis not present

## 2020-09-28 DIAGNOSIS — Z992 Dependence on renal dialysis: Secondary | ICD-10-CM | POA: Diagnosis not present

## 2020-09-28 DIAGNOSIS — N186 End stage renal disease: Secondary | ICD-10-CM | POA: Diagnosis not present

## 2020-09-30 DIAGNOSIS — Z992 Dependence on renal dialysis: Secondary | ICD-10-CM | POA: Diagnosis not present

## 2020-09-30 DIAGNOSIS — D631 Anemia in chronic kidney disease: Secondary | ICD-10-CM | POA: Diagnosis not present

## 2020-09-30 DIAGNOSIS — D509 Iron deficiency anemia, unspecified: Secondary | ICD-10-CM | POA: Diagnosis not present

## 2020-09-30 DIAGNOSIS — N2581 Secondary hyperparathyroidism of renal origin: Secondary | ICD-10-CM | POA: Diagnosis not present

## 2020-09-30 DIAGNOSIS — N186 End stage renal disease: Secondary | ICD-10-CM | POA: Diagnosis not present

## 2020-10-02 DIAGNOSIS — Z992 Dependence on renal dialysis: Secondary | ICD-10-CM | POA: Diagnosis not present

## 2020-10-02 DIAGNOSIS — N186 End stage renal disease: Secondary | ICD-10-CM | POA: Diagnosis not present

## 2020-10-02 DIAGNOSIS — N2581 Secondary hyperparathyroidism of renal origin: Secondary | ICD-10-CM | POA: Diagnosis not present

## 2020-10-02 DIAGNOSIS — D509 Iron deficiency anemia, unspecified: Secondary | ICD-10-CM | POA: Diagnosis not present

## 2020-10-02 DIAGNOSIS — D631 Anemia in chronic kidney disease: Secondary | ICD-10-CM | POA: Diagnosis not present

## 2020-10-04 DIAGNOSIS — N186 End stage renal disease: Secondary | ICD-10-CM | POA: Diagnosis not present

## 2020-10-04 DIAGNOSIS — D509 Iron deficiency anemia, unspecified: Secondary | ICD-10-CM | POA: Diagnosis not present

## 2020-10-04 DIAGNOSIS — Z992 Dependence on renal dialysis: Secondary | ICD-10-CM | POA: Diagnosis not present

## 2020-10-04 DIAGNOSIS — N2581 Secondary hyperparathyroidism of renal origin: Secondary | ICD-10-CM | POA: Diagnosis not present

## 2020-10-04 DIAGNOSIS — D631 Anemia in chronic kidney disease: Secondary | ICD-10-CM | POA: Diagnosis not present

## 2020-10-07 DIAGNOSIS — N186 End stage renal disease: Secondary | ICD-10-CM | POA: Diagnosis not present

## 2020-10-07 DIAGNOSIS — D509 Iron deficiency anemia, unspecified: Secondary | ICD-10-CM | POA: Diagnosis not present

## 2020-10-07 DIAGNOSIS — D631 Anemia in chronic kidney disease: Secondary | ICD-10-CM | POA: Diagnosis not present

## 2020-10-07 DIAGNOSIS — N2581 Secondary hyperparathyroidism of renal origin: Secondary | ICD-10-CM | POA: Diagnosis not present

## 2020-10-07 DIAGNOSIS — Z992 Dependence on renal dialysis: Secondary | ICD-10-CM | POA: Diagnosis not present

## 2020-10-09 DIAGNOSIS — D631 Anemia in chronic kidney disease: Secondary | ICD-10-CM | POA: Diagnosis not present

## 2020-10-09 DIAGNOSIS — N2581 Secondary hyperparathyroidism of renal origin: Secondary | ICD-10-CM | POA: Diagnosis not present

## 2020-10-09 DIAGNOSIS — Z992 Dependence on renal dialysis: Secondary | ICD-10-CM | POA: Diagnosis not present

## 2020-10-09 DIAGNOSIS — N186 End stage renal disease: Secondary | ICD-10-CM | POA: Diagnosis not present

## 2020-10-09 DIAGNOSIS — D509 Iron deficiency anemia, unspecified: Secondary | ICD-10-CM | POA: Diagnosis not present

## 2020-10-11 DIAGNOSIS — D631 Anemia in chronic kidney disease: Secondary | ICD-10-CM | POA: Diagnosis not present

## 2020-10-11 DIAGNOSIS — N186 End stage renal disease: Secondary | ICD-10-CM | POA: Diagnosis not present

## 2020-10-11 DIAGNOSIS — D509 Iron deficiency anemia, unspecified: Secondary | ICD-10-CM | POA: Diagnosis not present

## 2020-10-11 DIAGNOSIS — N2581 Secondary hyperparathyroidism of renal origin: Secondary | ICD-10-CM | POA: Diagnosis not present

## 2020-10-11 DIAGNOSIS — Z992 Dependence on renal dialysis: Secondary | ICD-10-CM | POA: Diagnosis not present

## 2020-10-14 ENCOUNTER — Telehealth: Payer: Self-pay

## 2020-10-14 ENCOUNTER — Other Ambulatory Visit: Payer: Self-pay

## 2020-10-14 DIAGNOSIS — D631 Anemia in chronic kidney disease: Secondary | ICD-10-CM | POA: Diagnosis not present

## 2020-10-14 DIAGNOSIS — N186 End stage renal disease: Secondary | ICD-10-CM | POA: Diagnosis not present

## 2020-10-14 DIAGNOSIS — D509 Iron deficiency anemia, unspecified: Secondary | ICD-10-CM | POA: Diagnosis not present

## 2020-10-14 DIAGNOSIS — Z992 Dependence on renal dialysis: Secondary | ICD-10-CM | POA: Diagnosis not present

## 2020-10-14 DIAGNOSIS — N2581 Secondary hyperparathyroidism of renal origin: Secondary | ICD-10-CM | POA: Diagnosis not present

## 2020-10-14 NOTE — Telephone Encounter (Signed)
Ramiro Harvest HD PA calls today to report that patient has been requiring more and more TDC exchanges. She was last seen by [facility] in May - and it is likely she has exhausted all upper extremity access options - placed on schedule for follow up with MD and ABIs.

## 2020-10-15 DIAGNOSIS — T82898A Other specified complication of vascular prosthetic devices, implants and grafts, initial encounter: Secondary | ICD-10-CM | POA: Diagnosis not present

## 2020-10-15 DIAGNOSIS — N186 End stage renal disease: Secondary | ICD-10-CM | POA: Diagnosis not present

## 2020-10-15 DIAGNOSIS — Z992 Dependence on renal dialysis: Secondary | ICD-10-CM | POA: Diagnosis not present

## 2020-10-16 DIAGNOSIS — N186 End stage renal disease: Secondary | ICD-10-CM | POA: Diagnosis not present

## 2020-10-16 DIAGNOSIS — N2581 Secondary hyperparathyroidism of renal origin: Secondary | ICD-10-CM | POA: Diagnosis not present

## 2020-10-16 DIAGNOSIS — D631 Anemia in chronic kidney disease: Secondary | ICD-10-CM | POA: Diagnosis not present

## 2020-10-16 DIAGNOSIS — D509 Iron deficiency anemia, unspecified: Secondary | ICD-10-CM | POA: Diagnosis not present

## 2020-10-16 DIAGNOSIS — Z992 Dependence on renal dialysis: Secondary | ICD-10-CM | POA: Diagnosis not present

## 2020-10-17 ENCOUNTER — Encounter (HOSPITAL_COMMUNITY): Payer: Medicare Other

## 2020-10-17 ENCOUNTER — Ambulatory Visit: Payer: Medicare Other | Admitting: Vascular Surgery

## 2020-10-18 DIAGNOSIS — D631 Anemia in chronic kidney disease: Secondary | ICD-10-CM | POA: Diagnosis not present

## 2020-10-18 DIAGNOSIS — N2581 Secondary hyperparathyroidism of renal origin: Secondary | ICD-10-CM | POA: Diagnosis not present

## 2020-10-18 DIAGNOSIS — D509 Iron deficiency anemia, unspecified: Secondary | ICD-10-CM | POA: Diagnosis not present

## 2020-10-18 DIAGNOSIS — N186 End stage renal disease: Secondary | ICD-10-CM | POA: Diagnosis not present

## 2020-10-18 DIAGNOSIS — Z992 Dependence on renal dialysis: Secondary | ICD-10-CM | POA: Diagnosis not present

## 2020-10-21 DIAGNOSIS — D631 Anemia in chronic kidney disease: Secondary | ICD-10-CM | POA: Diagnosis not present

## 2020-10-21 DIAGNOSIS — N2581 Secondary hyperparathyroidism of renal origin: Secondary | ICD-10-CM | POA: Diagnosis not present

## 2020-10-21 DIAGNOSIS — N186 End stage renal disease: Secondary | ICD-10-CM | POA: Diagnosis not present

## 2020-10-21 DIAGNOSIS — Z992 Dependence on renal dialysis: Secondary | ICD-10-CM | POA: Diagnosis not present

## 2020-10-21 DIAGNOSIS — D509 Iron deficiency anemia, unspecified: Secondary | ICD-10-CM | POA: Diagnosis not present

## 2020-10-23 DIAGNOSIS — N186 End stage renal disease: Secondary | ICD-10-CM | POA: Diagnosis not present

## 2020-10-23 DIAGNOSIS — D509 Iron deficiency anemia, unspecified: Secondary | ICD-10-CM | POA: Diagnosis not present

## 2020-10-23 DIAGNOSIS — Z992 Dependence on renal dialysis: Secondary | ICD-10-CM | POA: Diagnosis not present

## 2020-10-23 DIAGNOSIS — N2581 Secondary hyperparathyroidism of renal origin: Secondary | ICD-10-CM | POA: Diagnosis not present

## 2020-10-23 DIAGNOSIS — D631 Anemia in chronic kidney disease: Secondary | ICD-10-CM | POA: Diagnosis not present

## 2020-10-25 DIAGNOSIS — N2581 Secondary hyperparathyroidism of renal origin: Secondary | ICD-10-CM | POA: Diagnosis not present

## 2020-10-25 DIAGNOSIS — Z992 Dependence on renal dialysis: Secondary | ICD-10-CM | POA: Diagnosis not present

## 2020-10-25 DIAGNOSIS — D631 Anemia in chronic kidney disease: Secondary | ICD-10-CM | POA: Diagnosis not present

## 2020-10-25 DIAGNOSIS — D509 Iron deficiency anemia, unspecified: Secondary | ICD-10-CM | POA: Diagnosis not present

## 2020-10-25 DIAGNOSIS — N186 End stage renal disease: Secondary | ICD-10-CM | POA: Diagnosis not present

## 2020-10-28 DIAGNOSIS — N186 End stage renal disease: Secondary | ICD-10-CM | POA: Diagnosis not present

## 2020-10-28 DIAGNOSIS — D509 Iron deficiency anemia, unspecified: Secondary | ICD-10-CM | POA: Diagnosis not present

## 2020-10-28 DIAGNOSIS — Z992 Dependence on renal dialysis: Secondary | ICD-10-CM | POA: Diagnosis not present

## 2020-10-28 DIAGNOSIS — D631 Anemia in chronic kidney disease: Secondary | ICD-10-CM | POA: Diagnosis not present

## 2020-10-28 DIAGNOSIS — N2581 Secondary hyperparathyroidism of renal origin: Secondary | ICD-10-CM | POA: Diagnosis not present

## 2020-10-29 DIAGNOSIS — Z992 Dependence on renal dialysis: Secondary | ICD-10-CM | POA: Diagnosis not present

## 2020-10-29 DIAGNOSIS — N186 End stage renal disease: Secondary | ICD-10-CM | POA: Diagnosis not present

## 2020-10-30 DIAGNOSIS — D631 Anemia in chronic kidney disease: Secondary | ICD-10-CM | POA: Diagnosis not present

## 2020-10-30 DIAGNOSIS — N186 End stage renal disease: Secondary | ICD-10-CM | POA: Diagnosis not present

## 2020-10-30 DIAGNOSIS — Z992 Dependence on renal dialysis: Secondary | ICD-10-CM | POA: Diagnosis not present

## 2020-10-30 DIAGNOSIS — D509 Iron deficiency anemia, unspecified: Secondary | ICD-10-CM | POA: Diagnosis not present

## 2020-11-01 DIAGNOSIS — N186 End stage renal disease: Secondary | ICD-10-CM | POA: Diagnosis not present

## 2020-11-01 DIAGNOSIS — Z992 Dependence on renal dialysis: Secondary | ICD-10-CM | POA: Diagnosis not present

## 2020-11-01 DIAGNOSIS — D509 Iron deficiency anemia, unspecified: Secondary | ICD-10-CM | POA: Diagnosis not present

## 2020-11-01 DIAGNOSIS — D631 Anemia in chronic kidney disease: Secondary | ICD-10-CM | POA: Diagnosis not present

## 2020-11-04 DIAGNOSIS — Z992 Dependence on renal dialysis: Secondary | ICD-10-CM | POA: Diagnosis not present

## 2020-11-04 DIAGNOSIS — D631 Anemia in chronic kidney disease: Secondary | ICD-10-CM | POA: Diagnosis not present

## 2020-11-04 DIAGNOSIS — N186 End stage renal disease: Secondary | ICD-10-CM | POA: Diagnosis not present

## 2020-11-04 DIAGNOSIS — D509 Iron deficiency anemia, unspecified: Secondary | ICD-10-CM | POA: Diagnosis not present

## 2020-11-06 DIAGNOSIS — N186 End stage renal disease: Secondary | ICD-10-CM | POA: Diagnosis not present

## 2020-11-06 DIAGNOSIS — Z992 Dependence on renal dialysis: Secondary | ICD-10-CM | POA: Diagnosis not present

## 2020-11-06 DIAGNOSIS — D631 Anemia in chronic kidney disease: Secondary | ICD-10-CM | POA: Diagnosis not present

## 2020-11-06 DIAGNOSIS — D509 Iron deficiency anemia, unspecified: Secondary | ICD-10-CM | POA: Diagnosis not present

## 2020-11-08 DIAGNOSIS — D631 Anemia in chronic kidney disease: Secondary | ICD-10-CM | POA: Diagnosis not present

## 2020-11-08 DIAGNOSIS — Z992 Dependence on renal dialysis: Secondary | ICD-10-CM | POA: Diagnosis not present

## 2020-11-08 DIAGNOSIS — N186 End stage renal disease: Secondary | ICD-10-CM | POA: Diagnosis not present

## 2020-11-08 DIAGNOSIS — D509 Iron deficiency anemia, unspecified: Secondary | ICD-10-CM | POA: Diagnosis not present

## 2020-11-11 DIAGNOSIS — Z992 Dependence on renal dialysis: Secondary | ICD-10-CM | POA: Diagnosis not present

## 2020-11-11 DIAGNOSIS — N186 End stage renal disease: Secondary | ICD-10-CM | POA: Diagnosis not present

## 2020-11-11 DIAGNOSIS — D631 Anemia in chronic kidney disease: Secondary | ICD-10-CM | POA: Diagnosis not present

## 2020-11-11 DIAGNOSIS — D509 Iron deficiency anemia, unspecified: Secondary | ICD-10-CM | POA: Diagnosis not present

## 2020-11-12 ENCOUNTER — Ambulatory Visit: Payer: Medicare Other | Admitting: Vascular Surgery

## 2020-11-12 ENCOUNTER — Encounter (HOSPITAL_COMMUNITY): Payer: Medicare Other

## 2020-11-13 DIAGNOSIS — N186 End stage renal disease: Secondary | ICD-10-CM | POA: Diagnosis not present

## 2020-11-13 DIAGNOSIS — D509 Iron deficiency anemia, unspecified: Secondary | ICD-10-CM | POA: Diagnosis not present

## 2020-11-13 DIAGNOSIS — D631 Anemia in chronic kidney disease: Secondary | ICD-10-CM | POA: Diagnosis not present

## 2020-11-13 DIAGNOSIS — Z992 Dependence on renal dialysis: Secondary | ICD-10-CM | POA: Diagnosis not present

## 2020-11-15 DIAGNOSIS — D509 Iron deficiency anemia, unspecified: Secondary | ICD-10-CM | POA: Diagnosis not present

## 2020-11-15 DIAGNOSIS — D631 Anemia in chronic kidney disease: Secondary | ICD-10-CM | POA: Diagnosis not present

## 2020-11-15 DIAGNOSIS — Z992 Dependence on renal dialysis: Secondary | ICD-10-CM | POA: Diagnosis not present

## 2020-11-15 DIAGNOSIS — N186 End stage renal disease: Secondary | ICD-10-CM | POA: Diagnosis not present

## 2020-11-18 DIAGNOSIS — D509 Iron deficiency anemia, unspecified: Secondary | ICD-10-CM | POA: Diagnosis not present

## 2020-11-18 DIAGNOSIS — D631 Anemia in chronic kidney disease: Secondary | ICD-10-CM | POA: Diagnosis not present

## 2020-11-18 DIAGNOSIS — Z992 Dependence on renal dialysis: Secondary | ICD-10-CM | POA: Diagnosis not present

## 2020-11-18 DIAGNOSIS — N186 End stage renal disease: Secondary | ICD-10-CM | POA: Diagnosis not present

## 2020-11-20 DIAGNOSIS — Z992 Dependence on renal dialysis: Secondary | ICD-10-CM | POA: Diagnosis not present

## 2020-11-20 DIAGNOSIS — N186 End stage renal disease: Secondary | ICD-10-CM | POA: Diagnosis not present

## 2020-11-20 DIAGNOSIS — D509 Iron deficiency anemia, unspecified: Secondary | ICD-10-CM | POA: Diagnosis not present

## 2020-11-20 DIAGNOSIS — D631 Anemia in chronic kidney disease: Secondary | ICD-10-CM | POA: Diagnosis not present

## 2020-11-21 ENCOUNTER — Ambulatory Visit (HOSPITAL_COMMUNITY)
Admission: RE | Admit: 2020-11-21 | Discharge: 2020-11-21 | Disposition: A | Discharge status: Home / Self Care | Payer: Medicare Other | Source: Ambulatory Visit | Attending: Vascular Surgery | Admitting: Vascular Surgery

## 2020-11-21 ENCOUNTER — Encounter: Payer: Self-pay | Admitting: Vascular Surgery

## 2020-11-21 ENCOUNTER — Ambulatory Visit (INDEPENDENT_AMBULATORY_CARE_PROVIDER_SITE_OTHER): Payer: Medicare Other | Admitting: Vascular Surgery

## 2020-11-21 ENCOUNTER — Other Ambulatory Visit: Payer: Self-pay

## 2020-11-21 VITALS — BP 101/67 | Temp 97.9°F | Resp 20 | Ht 65.0 in | Wt 166.0 lb

## 2020-11-21 DIAGNOSIS — N186 End stage renal disease: Secondary | ICD-10-CM | POA: Diagnosis not present

## 2020-11-21 DIAGNOSIS — Z992 Dependence on renal dialysis: Secondary | ICD-10-CM | POA: Insufficient documentation

## 2020-11-21 NOTE — Progress Notes (Signed)
Office Note     CC:  ESRD Requesting Provider:  Manon Hilding, MD  HPI: Kelli [name] is a Right 70 y.o. (1950/03/18) female with kidney disease who presents at the request of [name], [name], MD for permanent HD access. Kelli [name] has had multiple prior access procedures in her bilateral upper extremities. These include brachiobasilic fistulas and AV grafts bilaterally -all of which have failed.  Presents today to discuss alternative access points, namely a thigh arteriovenous fistula versus graft. 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  She continues to live at home with her husband who is her caregiver.  Kelli [name] is able to use a fork, however is unable to perform any of her other ADLs.  She has had tunneled lines in bilateral internal jugular veins, and is currently being dialyzed through a right IJ tunneled line.  The pt is on a statin for cholesterol management.  The pt is not on a daily aspirin.   Other AC: 5 and The pt is on torsemide, metoprolol, hydralazine for hypertension.   The pt is diabetic.   Tobacco MW:[id number]  Past Medical History:  Diagnosis Date   Anemia    Anginal pain (HCC)    Anxiety    Arthritis    Carotid artery occlusion    Occluded RICA, status post left CEA  August 2014 - Dr. Donnetta Hutching   Cerebral infarction [facility]) Aug 2014   Bihemispheric watershed infarcts   Cerebral infarction involving left cerebellar artery [facility]) Feb 2015   CHF (congestive heart failure) (HCC)    CKD (chronic kidney disease) stage 3, GFR 30-59 ml/min (HCC)    dialysis T/Th/sa   Closed dislocation of left humerus 07/26/2013   Depression    DM (diabetes mellitus), type 2 (HCC)    Dyspnea    with exertion   Dysrhythmia    Essential hypertension, benign    Fibromyalgia    Headache    History of kidney stones    Mixed hyperlipidemia    Multiple gastric ulcers    Pneumonia    Stroke [facility])    pt states she  cannot walk, left arm weakness   Urinary incontinence     Past Surgical History:  Procedure Laterality Date   A/V FISTULAGRAM N/A 06/06/2019   Procedure: A/V FISTULAGRAM;  Surgeon: Marty Heck, MD;  Location: [place] [facility];  Service: Cardiovascular;  Laterality: N/A;   AV FISTULA PLACEMENT Left 04/15/2017   Procedure: ARTERIOVENOUS (AV) FISTULA CREATION LEFT ARM;  Surgeon: Serafina Mitchell, MD;  Location: [facility] OR;  Service: Vascular;  Laterality: Left;   AV FISTULA PLACEMENT Left 05/09/2017   Procedure: INSERTION OF ARTERIOVENOUS (AV) GORE-TEX GRAFT LEFT UPPER ARM;  Surgeon: [name], MD;  Location: [place];  Service: Vascular;  Laterality: Left;   AV FISTULA PLACEMENT Right 09/06/2018   Procedure: INSERTION OF ARTERIOVENOUS (AV) GORE-TEX GRAFT RIGHT  ARM;  Surgeon: [name], MD;  Location: [place];  Service: Vascular;  Laterality: Right;   AV FISTULA PLACEMENT Right 07/03/2020   Procedure: Removal of right upper arm Gortex graft;  Surgeon: Waynetta Sandy, MD;  Location: [place];  Service: Vascular;  Laterality: Right;   [place] Right 08/09/2018   Procedure: BASILIC VEIN TRANSPOSITION 1st Stage;  Surgeon: [name], MD;  Location: [place];  Service: Vascular;  Laterality: Right;   BIOPSY  01/22/2017   Procedure: BIOPSY;  Surgeon:  Fields, Marga Melnick, MD;  [facility];  Service: Endoscopy;;  gastric   COMBINED HYSTERECTOMY VAGINAL W/ MMK / A&P REPAIR  1981   ENDARTERECTOMY Left 10/06/2012   Procedure: Carotid Endarterectomy with Finesse patch angioplasty;  Surgeon: [name], MD;  Location: [facility] OR;  Service: Vascular;  Laterality: Left;   ESOPHAGOGASTRODUODENOSCOPY N/A 04/26/2014   Procedure: ESOPHAGOGASTRODUODENOSCOPY (EGD);  Surgeon: Lear Ng, MD;  Location: [facility] ENDOSCOPY;  Service: Endoscopy;  Laterality: N/A;   ESOPHAGOGASTRODUODENOSCOPY N/A 01/22/2017   Procedure: ESOPHAGOGASTRODUODENOSCOPY (EGD);  Surgeon: Danie Binder, MD;   [facility];  Service: Endoscopy;  Laterality: N/A;   FLEXIBLE SIGMOIDOSCOPY N/A 01/22/2017   Procedure: FLEXIBLE SIGMOIDOSCOPY;  Surgeon: Danie Binder, MD;  [facility];  Service: Endoscopy;  Laterality: N/A;   INSERTION OF DIALYSIS CATHETER Right 07/03/2020   Procedure: INSERTION OF DIALYSIS CATHETER and exchange of right internal jugular dialysis catheter;  Surgeon: Waynetta Sandy, MD;  Location: [place];  Service: Vascular;  Laterality: Right;   IR CV LINE INJECTION  02/04/2017   IR CV LINE INJECTION  04/28/2018   IR CV LINE INJECTION  05/12/2018   IR CV LINE INJECTION  05/18/2018   IR FLUORO GUIDE CV LINE LEFT  01/14/2017   IR FLUORO GUIDE CV LINE LEFT  04/25/2017   IR FLUORO GUIDE CV LINE LEFT  05/13/2017   IR FLUORO GUIDE CV LINE LEFT  03/02/2018   IR FLUORO GUIDE CV LINE LEFT  04/28/2018   IR FLUORO GUIDE CV LINE LEFT  05/12/2018   IR FLUORO GUIDE CV LINE LEFT  05/18/2018   IR FLUORO GUIDE CV LINE LEFT  06/23/2018   IR FLUORO GUIDE CV LINE LEFT  08/02/2018   IR FLUORO GUIDE CV LINE RIGHT  08/22/2020   IR PTA VENOUS EXCEPT DIALYSIS CIRCUIT  04/28/2018   IR PTA VENOUS EXCEPT DIALYSIS CIRCUIT  05/12/2018   IR PTA VENOUS EXCEPT DIALYSIS CIRCUIT  08/22/2020   IR REMOVAL TUN CV CATH W/O FL  07/08/2017   IR REMOVAL TUN CV CATH W/O FL  10/11/2018   IR TRANSCATH RETRIEVAL FB INCL GUIDANCE (MS)  05/12/2018   IR US GUIDE VASC ACCESS LEFT  01/14/2017   LOOP RECORDER IMPLANT  04/16/13   MDT LinQ implanted for cryptogenic stroke   PERIPHERAL VASCULAR INTERVENTION Right 06/06/2019   Procedure: PERIPHERAL VASCULAR INTERVENTION;  Surgeon: Marty Heck, MD;  Location: [place] CV [facility];  Service: Cardiovascular;  Laterality: Right;   [name] WITHOUT CARDIOVERSION N/A 04/16/2013   Procedure: TRANSESOPHAGEAL ECHOCARDIOGRAM ([name]);  Surgeon: Josue Hector, MD;  Location: [place];  Service: Cardiovascular;  Laterality: N/A;   THROMBECTOMY AND REVISION OF ARTERIOVENTOUS (AV)  GORETEX  GRAFT Left 07/24/2017   Procedure: THROMBECTOMY OF LEFT UPPER ARM ARTERIOVENTOUS (AV) GRAFT;  Surgeon: Conrad Killeen, MD;  Location: [place];  Service: Vascular;  Laterality: Left;   URETHRAL DILATION  1980's   VAGINAL HYSTERECTOMY  1981   "partial" (10/04/2012)    Social History   Socioeconomic History   Marital status: Married    Spouse name: Gagandeep Kossman   Number of children: 3   Years of education: Not on file   Highest education level: Bachelor's degree (e.g., BA, AB, BS)  Occupational History   Not on file  Tobacco Use   Smoking status: Former    Packs/day: 1.00    Years: 44.00    Pack years: 44.00    Types: Cigarettes  Quit date: 10/04/2019    Years since quitting: 1.1   Smokeless tobacco: Never  Vaping Use   Vaping Use: Never used  Substance and Sexual Activity   Alcohol use: No    Alcohol/week: 0.0 standard drinks   Drug use: No   Sexual activity: Yes    Birth control/protection: Surgical    Comment: not too long ago  Other Topics Concern   Not on file  Social History Narrative   INITIALLY WORKED [place]. 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   Social Determinants of Health   Financial Resource Strain: Not on file  Food Insecurity: Not on file  Transportation Needs: Not on file  Physical Activity: Not on file  Stress: Not on file  Social Connections: Not on file  Intimate Partner Violence: Not on file    Family History  Problem Relation Age of Onset   Diabetes Mother    Diabetes Father    Hypertension Sister    Diabetes Brother    Seizures Son    Kidney disease Maternal Grandmother    Hypertension Maternal Grandmother    Heart disease Maternal Grandfather    Diabetes Paternal Grandmother    Heart disease Paternal Grandfather     Current Outpatient Medications  Medication Sig Dispense Refill   acetaminophen (TYLENOL) 325 MG tablet Take 2 tablets (650 mg total) by mouth every 6 (six) hours as  needed for mild pain (or Fever >/= 101).     amLODipine (NORVASC) 10 MG tablet Take 10 mg by mouth every evening.      atorvastatin (LIPITOR) 40 MG tablet Take 40 mg by mouth every evening.      bismuth subsalicylate (PEPTO BISMOL) 262 MG/15ML suspension Take 30 mLs by mouth every 6 (six) hours as needed for indigestion.     clonazePAM (KLONOPIN) 0.5 MG tablet Take 0.5 tablets (0.25 mg total) by mouth daily. 1 tablet 0   clopidogrel (PLAVIX) 75 MG tablet Take 1 tablet (75 mg total) by mouth daily. (Patient taking differently: Take 75 mg by mouth every evening.)     hydrALAZINE (APRESOLINE) 50 MG tablet Take 1 tablet (50 mg total) by mouth 3 (three) times daily. (Patient taking differently: Take 50 mg by mouth 2 (two) times daily.) 90 tablet 0   Insulin Glargine (BASAGLAR KWIKPEN) 100 UNIT/ML SOPN Inject 15 Units into the skin daily.     loperamide (IMODIUM) 2 MG capsule Take 2 mg by mouth as needed for diarrhea or loose stools.     methocarbamol (ROBAXIN) 500 MG tablet Take 1 tablet (500 mg total) by mouth every 6 (six) hours as needed for muscle spasms. 30 tablet 0   metoprolol succinate (TOPROL-XL) 100 MG 24 hr tablet Take 1 tablet (100 mg total) by mouth daily. (Patient taking differently: Take 100 mg by mouth every evening.) 30 tablet 0   torsemide (DEMADEX) 20 MG tablet Take 40 mg by mouth daily.     venlafaxine XR (EFFEXOR-XR) 150 MG 24 hr capsule Take 150 mg by mouth daily.      No current facility-administered medications for this visit.   Facility-Administered Medications Ordered in Other Visits  Medication Dose Route Frequency Provider Last Rate Last Admin   0.9 %  sodium chloride infusion   Intravenous Continuous Monia Sabal, PA-C   New Bag at 09/06/18 1206    Allergies  Allergen Reactions   Hydrochlorothiazide Nausea And Vomiting     REVIEW OF SYSTEMS:   [X]   denotes positive finding, [ ]  denotes negative finding Cardiac  Comments:  Chest pain or chest pressure:     Shortness of breath upon exertion:    Short of breath when lying flat:    Irregular heart rhythm:        Vascular    Pain in calf, thigh, or hip brought on by ambulation:    Pain in feet at night that wakes you up from your sleep:     Blood clot in your veins:    Leg swelling:         Pulmonary    Oxygen at home:    Productive cough:     Wheezing:         Neurologic    Sudden weakness in arms or legs:     Sudden numbness in arms or legs:     Sudden onset of difficulty speaking or slurred speech:    Temporary loss of vision in one eye:     Problems with dizziness:         Gastrointestinal    Blood in stool:     Vomited blood:         Genitourinary    Burning when urinating:     Blood in urine:        Psychiatric    Major depression:         Hematologic    Bleeding problems:    Problems with blood clotting too easily:        Skin    Rashes or ulcers:        Constitutional    Fever or chills:      PHYSICAL EXAMINATION:  Vitals:   11/21/20 1552  BP: 101/67  Resp: 20  Temp: 97.9 F (36.6 C)  SpO2: 95%  Weight: 166 lb (75.3 kg)  Height: 5\' 5"  (1.651 m)    General:  WDWN in NAD; vital signs documented above Gait: Not observed HENT: WNL, normocephalic Pulmonary: normal non-labored breathing , without Rales, rhonchi,  wheezing Cardiac: regular HR, Abdomen: soft, NT, no masses Skin: without rashes Vascular Exam/Pulses:  Right Left  Radial 2+ (normal) 2+ (normal)  Ulnar 1+ (weak) 1+ (weak)  Femoral - -               Extremities: without ischemic changes, without Gangrene , without cellulitis; without open wounds;  Musculoskeletal: no muscle wasting or atrophy  Neurologic: A&O X 3;  No focal weakness or paresthesias are detected Psychiatric:  The pt has Normal affect.   Non-Invasive Vascular Imaging:   Ankle-brachial index independently reviewed demonstrating mild to moderate disease bilaterally    ASSESSMENT/PLAN:  LAROSE BATRES is a 70  y.o. female who presents with end stage renal disease.  When I brought up the topic of thigh graft, Chantea was not interested.  I discussed the risk and benefits with her including that she has very few access points left for permanent dialysis.  Furthermore, we discussed the infection risk associated with tunneled HD line.  She again stated she wants nothing to do with thigh access.  Her husband was present, and stated she makes her decisions independently.  I discussed that without access, this could result in mortality, Katianne said "I am sure my time is coming soon".   Recommend continued tunneled HD line use, goals of care discussions, advanced directives.   I told Luwanda I am available should she ever decide to reconsider, and that I respected her decision.  Carolann Littler  Virl Cagey, MD [facility] [phone]

## 2020-11-22 DIAGNOSIS — D631 Anemia in chronic kidney disease: Secondary | ICD-10-CM | POA: Diagnosis not present

## 2020-11-22 DIAGNOSIS — D509 Iron deficiency anemia, unspecified: Secondary | ICD-10-CM | POA: Diagnosis not present

## 2020-11-22 DIAGNOSIS — Z992 Dependence on renal dialysis: Secondary | ICD-10-CM | POA: Diagnosis not present

## 2020-11-22 DIAGNOSIS — N186 End stage renal disease: Secondary | ICD-10-CM | POA: Diagnosis not present

## 2020-11-25 DIAGNOSIS — N186 End stage renal disease: Secondary | ICD-10-CM | POA: Diagnosis not present

## 2020-11-25 DIAGNOSIS — D631 Anemia in chronic kidney disease: Secondary | ICD-10-CM | POA: Diagnosis not present

## 2020-11-25 DIAGNOSIS — Z992 Dependence on renal dialysis: Secondary | ICD-10-CM | POA: Diagnosis not present

## 2020-11-25 DIAGNOSIS — D509 Iron deficiency anemia, unspecified: Secondary | ICD-10-CM | POA: Diagnosis not present

## 2020-11-27 DIAGNOSIS — Z992 Dependence on renal dialysis: Secondary | ICD-10-CM | POA: Diagnosis not present

## 2020-11-27 DIAGNOSIS — N186 End stage renal disease: Secondary | ICD-10-CM | POA: Diagnosis not present

## 2020-11-27 DIAGNOSIS — D631 Anemia in chronic kidney disease: Secondary | ICD-10-CM | POA: Diagnosis not present

## 2020-11-27 DIAGNOSIS — D509 Iron deficiency anemia, unspecified: Secondary | ICD-10-CM | POA: Diagnosis not present

## 2020-11-28 DIAGNOSIS — Z992 Dependence on renal dialysis: Secondary | ICD-10-CM | POA: Diagnosis not present

## 2020-11-28 DIAGNOSIS — N186 End stage renal disease: Secondary | ICD-10-CM | POA: Diagnosis not present

## 2020-11-29 DIAGNOSIS — D631 Anemia in chronic kidney disease: Secondary | ICD-10-CM | POA: Diagnosis not present

## 2020-11-29 DIAGNOSIS — D509 Iron deficiency anemia, unspecified: Secondary | ICD-10-CM | POA: Diagnosis not present

## 2020-11-29 DIAGNOSIS — Z23 Encounter for immunization: Secondary | ICD-10-CM | POA: Diagnosis not present

## 2020-11-29 DIAGNOSIS — Z992 Dependence on renal dialysis: Secondary | ICD-10-CM | POA: Diagnosis not present

## 2020-11-29 DIAGNOSIS — N186 End stage renal disease: Secondary | ICD-10-CM | POA: Diagnosis not present

## 2020-11-29 DIAGNOSIS — N2581 Secondary hyperparathyroidism of renal origin: Secondary | ICD-10-CM | POA: Diagnosis not present

## 2020-12-01 DIAGNOSIS — I871 Compression of vein: Secondary | ICD-10-CM | POA: Diagnosis not present

## 2020-12-01 DIAGNOSIS — N186 End stage renal disease: Secondary | ICD-10-CM | POA: Diagnosis not present

## 2020-12-01 DIAGNOSIS — Z992 Dependence on renal dialysis: Secondary | ICD-10-CM | POA: Diagnosis not present

## 2020-12-01 DIAGNOSIS — T8249XA Other complication of vascular dialysis catheter, initial encounter: Secondary | ICD-10-CM | POA: Diagnosis not present

## 2020-12-02 DIAGNOSIS — D631 Anemia in chronic kidney disease: Secondary | ICD-10-CM | POA: Diagnosis not present

## 2020-12-02 DIAGNOSIS — N2581 Secondary hyperparathyroidism of renal origin: Secondary | ICD-10-CM | POA: Diagnosis not present

## 2020-12-02 DIAGNOSIS — Z992 Dependence on renal dialysis: Secondary | ICD-10-CM | POA: Diagnosis not present

## 2020-12-02 DIAGNOSIS — N186 End stage renal disease: Secondary | ICD-10-CM | POA: Diagnosis not present

## 2020-12-02 DIAGNOSIS — Z23 Encounter for immunization: Secondary | ICD-10-CM | POA: Diagnosis not present

## 2020-12-02 DIAGNOSIS — D509 Iron deficiency anemia, unspecified: Secondary | ICD-10-CM | POA: Diagnosis not present

## 2020-12-04 DIAGNOSIS — D509 Iron deficiency anemia, unspecified: Secondary | ICD-10-CM | POA: Diagnosis not present

## 2020-12-04 DIAGNOSIS — E119 Type 2 diabetes mellitus without complications: Secondary | ICD-10-CM | POA: Diagnosis not present

## 2020-12-04 DIAGNOSIS — D631 Anemia in chronic kidney disease: Secondary | ICD-10-CM | POA: Diagnosis not present

## 2020-12-04 DIAGNOSIS — Z992 Dependence on renal dialysis: Secondary | ICD-10-CM | POA: Diagnosis not present

## 2020-12-04 DIAGNOSIS — Z23 Encounter for immunization: Secondary | ICD-10-CM | POA: Diagnosis not present

## 2020-12-04 DIAGNOSIS — N186 End stage renal disease: Secondary | ICD-10-CM | POA: Diagnosis not present

## 2020-12-04 DIAGNOSIS — N2581 Secondary hyperparathyroidism of renal origin: Secondary | ICD-10-CM | POA: Diagnosis not present

## 2020-12-06 DIAGNOSIS — D509 Iron deficiency anemia, unspecified: Secondary | ICD-10-CM | POA: Diagnosis not present

## 2020-12-06 DIAGNOSIS — N2581 Secondary hyperparathyroidism of renal origin: Secondary | ICD-10-CM | POA: Diagnosis not present

## 2020-12-06 DIAGNOSIS — D631 Anemia in chronic kidney disease: Secondary | ICD-10-CM | POA: Diagnosis not present

## 2020-12-06 DIAGNOSIS — N186 End stage renal disease: Secondary | ICD-10-CM | POA: Diagnosis not present

## 2020-12-06 DIAGNOSIS — Z992 Dependence on renal dialysis: Secondary | ICD-10-CM | POA: Diagnosis not present

## 2020-12-06 DIAGNOSIS — Z23 Encounter for immunization: Secondary | ICD-10-CM | POA: Diagnosis not present

## 2020-12-09 DIAGNOSIS — D631 Anemia in chronic kidney disease: Secondary | ICD-10-CM | POA: Diagnosis not present

## 2020-12-09 DIAGNOSIS — D509 Iron deficiency anemia, unspecified: Secondary | ICD-10-CM | POA: Diagnosis not present

## 2020-12-09 DIAGNOSIS — Z992 Dependence on renal dialysis: Secondary | ICD-10-CM | POA: Diagnosis not present

## 2020-12-09 DIAGNOSIS — Z23 Encounter for immunization: Secondary | ICD-10-CM | POA: Diagnosis not present

## 2020-12-09 DIAGNOSIS — N186 End stage renal disease: Secondary | ICD-10-CM | POA: Diagnosis not present

## 2020-12-09 DIAGNOSIS — N2581 Secondary hyperparathyroidism of renal origin: Secondary | ICD-10-CM | POA: Diagnosis not present

## 2020-12-11 DIAGNOSIS — D509 Iron deficiency anemia, unspecified: Secondary | ICD-10-CM | POA: Diagnosis not present

## 2020-12-11 DIAGNOSIS — Z992 Dependence on renal dialysis: Secondary | ICD-10-CM | POA: Diagnosis not present

## 2020-12-11 DIAGNOSIS — N186 End stage renal disease: Secondary | ICD-10-CM | POA: Diagnosis not present

## 2020-12-11 DIAGNOSIS — Z23 Encounter for immunization: Secondary | ICD-10-CM | POA: Diagnosis not present

## 2020-12-11 DIAGNOSIS — N2581 Secondary hyperparathyroidism of renal origin: Secondary | ICD-10-CM | POA: Diagnosis not present

## 2020-12-11 DIAGNOSIS — D631 Anemia in chronic kidney disease: Secondary | ICD-10-CM | POA: Diagnosis not present

## 2020-12-13 DIAGNOSIS — D631 Anemia in chronic kidney disease: Secondary | ICD-10-CM | POA: Diagnosis not present

## 2020-12-13 DIAGNOSIS — N186 End stage renal disease: Secondary | ICD-10-CM | POA: Diagnosis not present

## 2020-12-13 DIAGNOSIS — Z992 Dependence on renal dialysis: Secondary | ICD-10-CM | POA: Diagnosis not present

## 2020-12-13 DIAGNOSIS — N2581 Secondary hyperparathyroidism of renal origin: Secondary | ICD-10-CM | POA: Diagnosis not present

## 2020-12-13 DIAGNOSIS — D509 Iron deficiency anemia, unspecified: Secondary | ICD-10-CM | POA: Diagnosis not present

## 2020-12-13 DIAGNOSIS — Z23 Encounter for immunization: Secondary | ICD-10-CM | POA: Diagnosis not present

## 2020-12-16 DIAGNOSIS — Z23 Encounter for immunization: Secondary | ICD-10-CM | POA: Diagnosis not present

## 2020-12-16 DIAGNOSIS — N2581 Secondary hyperparathyroidism of renal origin: Secondary | ICD-10-CM | POA: Diagnosis not present

## 2020-12-16 DIAGNOSIS — D509 Iron deficiency anemia, unspecified: Secondary | ICD-10-CM | POA: Diagnosis not present

## 2020-12-16 DIAGNOSIS — N186 End stage renal disease: Secondary | ICD-10-CM | POA: Diagnosis not present

## 2020-12-16 DIAGNOSIS — D631 Anemia in chronic kidney disease: Secondary | ICD-10-CM | POA: Diagnosis not present

## 2020-12-16 DIAGNOSIS — Z992 Dependence on renal dialysis: Secondary | ICD-10-CM | POA: Diagnosis not present

## 2020-12-18 DIAGNOSIS — N186 End stage renal disease: Secondary | ICD-10-CM | POA: Diagnosis not present

## 2020-12-18 DIAGNOSIS — N2581 Secondary hyperparathyroidism of renal origin: Secondary | ICD-10-CM | POA: Diagnosis not present

## 2020-12-18 DIAGNOSIS — D509 Iron deficiency anemia, unspecified: Secondary | ICD-10-CM | POA: Diagnosis not present

## 2020-12-18 DIAGNOSIS — Z992 Dependence on renal dialysis: Secondary | ICD-10-CM | POA: Diagnosis not present

## 2020-12-18 DIAGNOSIS — Z23 Encounter for immunization: Secondary | ICD-10-CM | POA: Diagnosis not present

## 2020-12-18 DIAGNOSIS — D631 Anemia in chronic kidney disease: Secondary | ICD-10-CM | POA: Diagnosis not present

## 2020-12-20 DIAGNOSIS — D631 Anemia in chronic kidney disease: Secondary | ICD-10-CM | POA: Diagnosis not present

## 2020-12-20 DIAGNOSIS — N186 End stage renal disease: Secondary | ICD-10-CM | POA: Diagnosis not present

## 2020-12-20 DIAGNOSIS — Z992 Dependence on renal dialysis: Secondary | ICD-10-CM | POA: Diagnosis not present

## 2020-12-20 DIAGNOSIS — N2581 Secondary hyperparathyroidism of renal origin: Secondary | ICD-10-CM | POA: Diagnosis not present

## 2020-12-20 DIAGNOSIS — D509 Iron deficiency anemia, unspecified: Secondary | ICD-10-CM | POA: Diagnosis not present

## 2020-12-20 DIAGNOSIS — Z23 Encounter for immunization: Secondary | ICD-10-CM | POA: Diagnosis not present

## 2020-12-23 DIAGNOSIS — Z23 Encounter for immunization: Secondary | ICD-10-CM | POA: Diagnosis not present

## 2020-12-23 DIAGNOSIS — Z992 Dependence on renal dialysis: Secondary | ICD-10-CM | POA: Diagnosis not present

## 2020-12-23 DIAGNOSIS — N2581 Secondary hyperparathyroidism of renal origin: Secondary | ICD-10-CM | POA: Diagnosis not present

## 2020-12-23 DIAGNOSIS — N186 End stage renal disease: Secondary | ICD-10-CM | POA: Diagnosis not present

## 2020-12-23 DIAGNOSIS — D509 Iron deficiency anemia, unspecified: Secondary | ICD-10-CM | POA: Diagnosis not present

## 2020-12-23 DIAGNOSIS — D631 Anemia in chronic kidney disease: Secondary | ICD-10-CM | POA: Diagnosis not present

## 2020-12-25 DIAGNOSIS — D509 Iron deficiency anemia, unspecified: Secondary | ICD-10-CM | POA: Diagnosis not present

## 2020-12-25 DIAGNOSIS — N186 End stage renal disease: Secondary | ICD-10-CM | POA: Diagnosis not present

## 2020-12-25 DIAGNOSIS — Z23 Encounter for immunization: Secondary | ICD-10-CM | POA: Diagnosis not present

## 2020-12-25 DIAGNOSIS — Z992 Dependence on renal dialysis: Secondary | ICD-10-CM | POA: Diagnosis not present

## 2020-12-25 DIAGNOSIS — D631 Anemia in chronic kidney disease: Secondary | ICD-10-CM | POA: Diagnosis not present

## 2020-12-25 DIAGNOSIS — N2581 Secondary hyperparathyroidism of renal origin: Secondary | ICD-10-CM | POA: Diagnosis not present

## 2020-12-27 DIAGNOSIS — N186 End stage renal disease: Secondary | ICD-10-CM | POA: Diagnosis not present

## 2020-12-27 DIAGNOSIS — D509 Iron deficiency anemia, unspecified: Secondary | ICD-10-CM | POA: Diagnosis not present

## 2020-12-27 DIAGNOSIS — N2581 Secondary hyperparathyroidism of renal origin: Secondary | ICD-10-CM | POA: Diagnosis not present

## 2020-12-27 DIAGNOSIS — Z992 Dependence on renal dialysis: Secondary | ICD-10-CM | POA: Diagnosis not present

## 2020-12-27 DIAGNOSIS — Z23 Encounter for immunization: Secondary | ICD-10-CM | POA: Diagnosis not present

## 2020-12-27 DIAGNOSIS — D631 Anemia in chronic kidney disease: Secondary | ICD-10-CM | POA: Diagnosis not present

## 2020-12-29 DIAGNOSIS — Z992 Dependence on renal dialysis: Secondary | ICD-10-CM | POA: Diagnosis not present

## 2020-12-29 DIAGNOSIS — N186 End stage renal disease: Secondary | ICD-10-CM | POA: Diagnosis not present

## 2020-12-30 DIAGNOSIS — N2581 Secondary hyperparathyroidism of renal origin: Secondary | ICD-10-CM | POA: Diagnosis not present

## 2020-12-30 DIAGNOSIS — Z992 Dependence on renal dialysis: Secondary | ICD-10-CM | POA: Diagnosis not present

## 2020-12-30 DIAGNOSIS — D631 Anemia in chronic kidney disease: Secondary | ICD-10-CM | POA: Diagnosis not present

## 2020-12-30 DIAGNOSIS — N186 End stage renal disease: Secondary | ICD-10-CM | POA: Diagnosis not present

## 2020-12-30 DIAGNOSIS — D509 Iron deficiency anemia, unspecified: Secondary | ICD-10-CM | POA: Diagnosis not present

## 2021-01-01 DIAGNOSIS — N186 End stage renal disease: Secondary | ICD-10-CM | POA: Diagnosis not present

## 2021-01-01 DIAGNOSIS — D509 Iron deficiency anemia, unspecified: Secondary | ICD-10-CM | POA: Diagnosis not present

## 2021-01-01 DIAGNOSIS — Z992 Dependence on renal dialysis: Secondary | ICD-10-CM | POA: Diagnosis not present

## 2021-01-01 DIAGNOSIS — D631 Anemia in chronic kidney disease: Secondary | ICD-10-CM | POA: Diagnosis not present

## 2021-01-01 DIAGNOSIS — N2581 Secondary hyperparathyroidism of renal origin: Secondary | ICD-10-CM | POA: Diagnosis not present

## 2021-01-03 DIAGNOSIS — D631 Anemia in chronic kidney disease: Secondary | ICD-10-CM | POA: Diagnosis not present

## 2021-01-03 DIAGNOSIS — Z992 Dependence on renal dialysis: Secondary | ICD-10-CM | POA: Diagnosis not present

## 2021-01-03 DIAGNOSIS — D509 Iron deficiency anemia, unspecified: Secondary | ICD-10-CM | POA: Diagnosis not present

## 2021-01-03 DIAGNOSIS — N186 End stage renal disease: Secondary | ICD-10-CM | POA: Diagnosis not present

## 2021-01-03 DIAGNOSIS — N2581 Secondary hyperparathyroidism of renal origin: Secondary | ICD-10-CM | POA: Diagnosis not present

## 2021-01-06 DIAGNOSIS — D509 Iron deficiency anemia, unspecified: Secondary | ICD-10-CM | POA: Diagnosis not present

## 2021-01-06 DIAGNOSIS — D631 Anemia in chronic kidney disease: Secondary | ICD-10-CM | POA: Diagnosis not present

## 2021-01-06 DIAGNOSIS — N186 End stage renal disease: Secondary | ICD-10-CM | POA: Diagnosis not present

## 2021-01-06 DIAGNOSIS — Z992 Dependence on renal dialysis: Secondary | ICD-10-CM | POA: Diagnosis not present

## 2021-01-06 DIAGNOSIS — J189 Pneumonia, unspecified organism: Secondary | ICD-10-CM | POA: Diagnosis not present

## 2021-01-06 DIAGNOSIS — N2581 Secondary hyperparathyroidism of renal origin: Secondary | ICD-10-CM | POA: Diagnosis not present

## 2021-01-08 DIAGNOSIS — D509 Iron deficiency anemia, unspecified: Secondary | ICD-10-CM | POA: Diagnosis not present

## 2021-01-08 DIAGNOSIS — N2581 Secondary hyperparathyroidism of renal origin: Secondary | ICD-10-CM | POA: Diagnosis not present

## 2021-01-08 DIAGNOSIS — N186 End stage renal disease: Secondary | ICD-10-CM | POA: Diagnosis not present

## 2021-01-08 DIAGNOSIS — D631 Anemia in chronic kidney disease: Secondary | ICD-10-CM | POA: Diagnosis not present

## 2021-01-08 DIAGNOSIS — Z992 Dependence on renal dialysis: Secondary | ICD-10-CM | POA: Diagnosis not present

## 2021-01-10 DIAGNOSIS — D509 Iron deficiency anemia, unspecified: Secondary | ICD-10-CM | POA: Diagnosis not present

## 2021-01-10 DIAGNOSIS — N186 End stage renal disease: Secondary | ICD-10-CM | POA: Diagnosis not present

## 2021-01-10 DIAGNOSIS — Z992 Dependence on renal dialysis: Secondary | ICD-10-CM | POA: Diagnosis not present

## 2021-01-10 DIAGNOSIS — N2581 Secondary hyperparathyroidism of renal origin: Secondary | ICD-10-CM | POA: Diagnosis not present

## 2021-01-10 DIAGNOSIS — D631 Anemia in chronic kidney disease: Secondary | ICD-10-CM | POA: Diagnosis not present

## 2021-01-12 DIAGNOSIS — J189 Pneumonia, unspecified organism: Secondary | ICD-10-CM | POA: Diagnosis not present

## 2021-01-13 DIAGNOSIS — Z992 Dependence on renal dialysis: Secondary | ICD-10-CM | POA: Diagnosis not present

## 2021-01-13 DIAGNOSIS — N186 End stage renal disease: Secondary | ICD-10-CM | POA: Diagnosis not present

## 2021-01-13 DIAGNOSIS — N2581 Secondary hyperparathyroidism of renal origin: Secondary | ICD-10-CM | POA: Diagnosis not present

## 2021-01-13 DIAGNOSIS — D509 Iron deficiency anemia, unspecified: Secondary | ICD-10-CM | POA: Diagnosis not present

## 2021-01-13 DIAGNOSIS — D631 Anemia in chronic kidney disease: Secondary | ICD-10-CM | POA: Diagnosis not present

## 2021-01-14 ENCOUNTER — Inpatient Hospital Stay (HOSPITAL_COMMUNITY)
Admission: EM | Admit: 2021-01-14 | Discharge: 2021-01-17 | DRG: 193 | Disposition: A | Discharge status: Home / Self Care | Payer: Medicare Other | Attending: Internal Medicine | Admitting: Internal Medicine

## 2021-01-14 ENCOUNTER — Other Ambulatory Visit: Payer: Self-pay

## 2021-01-14 ENCOUNTER — Emergency Department (HOSPITAL_COMMUNITY): Payer: Medicare Other

## 2021-01-14 ENCOUNTER — Encounter (HOSPITAL_COMMUNITY): Payer: Self-pay

## 2021-01-14 DIAGNOSIS — E441 Mild protein-calorie malnutrition: Secondary | ICD-10-CM | POA: Diagnosis not present

## 2021-01-14 DIAGNOSIS — Z794 Long term (current) use of insulin: Secondary | ICD-10-CM

## 2021-01-14 DIAGNOSIS — M1990 Unspecified osteoarthritis, unspecified site: Secondary | ICD-10-CM | POA: Diagnosis present

## 2021-01-14 DIAGNOSIS — I4891 Unspecified atrial fibrillation: Secondary | ICD-10-CM | POA: Diagnosis present

## 2021-01-14 DIAGNOSIS — Z8249 Family history of ischemic heart disease and other diseases of the circulatory system: Secondary | ICD-10-CM

## 2021-01-14 DIAGNOSIS — M797 Fibromyalgia: Secondary | ICD-10-CM | POA: Diagnosis present

## 2021-01-14 DIAGNOSIS — I4819 Other persistent atrial fibrillation: Secondary | ICD-10-CM | POA: Diagnosis present

## 2021-01-14 DIAGNOSIS — I517 Cardiomegaly: Secondary | ICD-10-CM | POA: Diagnosis not present

## 2021-01-14 DIAGNOSIS — Z6826 Body mass index (BMI) 26.0-26.9, adult: Secondary | ICD-10-CM

## 2021-01-14 DIAGNOSIS — Z20822 Contact with and (suspected) exposure to covid-19: Secondary | ICD-10-CM | POA: Diagnosis present

## 2021-01-14 DIAGNOSIS — J189 Pneumonia, unspecified organism: Principal | ICD-10-CM

## 2021-01-14 DIAGNOSIS — E1165 Type 2 diabetes mellitus with hyperglycemia: Secondary | ICD-10-CM

## 2021-01-14 DIAGNOSIS — Z90710 Acquired absence of both cervix and uterus: Secondary | ICD-10-CM

## 2021-01-14 DIAGNOSIS — Z79899 Other long term (current) drug therapy: Secondary | ICD-10-CM

## 2021-01-14 DIAGNOSIS — Z87442 Personal history of urinary calculi: Secondary | ICD-10-CM

## 2021-01-14 DIAGNOSIS — E1122 Type 2 diabetes mellitus with diabetic chronic kidney disease: Secondary | ICD-10-CM | POA: Diagnosis present

## 2021-01-14 DIAGNOSIS — Z992 Dependence on renal dialysis: Secondary | ICD-10-CM

## 2021-01-14 DIAGNOSIS — N186 End stage renal disease: Secondary | ICD-10-CM | POA: Diagnosis not present

## 2021-01-14 DIAGNOSIS — I5032 Chronic diastolic (congestive) heart failure: Secondary | ICD-10-CM | POA: Diagnosis present

## 2021-01-14 DIAGNOSIS — I132 Hypertensive heart and chronic kidney disease with heart failure and with stage 5 chronic kidney disease, or end stage renal disease: Secondary | ICD-10-CM | POA: Diagnosis not present

## 2021-01-14 DIAGNOSIS — F419 Anxiety disorder, unspecified: Secondary | ICD-10-CM | POA: Diagnosis present

## 2021-01-14 DIAGNOSIS — Z8673 Personal history of transient ischemic attack (TIA), and cerebral infarction without residual deficits: Secondary | ICD-10-CM

## 2021-01-14 DIAGNOSIS — I10 Essential (primary) hypertension: Secondary | ICD-10-CM | POA: Diagnosis present

## 2021-01-14 DIAGNOSIS — E8809 Other disorders of plasma-protein metabolism, not elsewhere classified: Secondary | ICD-10-CM | POA: Diagnosis present

## 2021-01-14 DIAGNOSIS — Z7401 Bed confinement status: Secondary | ICD-10-CM

## 2021-01-14 DIAGNOSIS — R0602 Shortness of breath: Secondary | ICD-10-CM | POA: Diagnosis not present

## 2021-01-14 DIAGNOSIS — Z8711 Personal history of peptic ulcer disease: Secondary | ICD-10-CM

## 2021-01-14 DIAGNOSIS — I482 Chronic atrial fibrillation, unspecified: Secondary | ICD-10-CM | POA: Diagnosis present

## 2021-01-14 DIAGNOSIS — F32A Depression, unspecified: Secondary | ICD-10-CM | POA: Diagnosis present

## 2021-01-14 DIAGNOSIS — R0789 Other chest pain: Secondary | ICD-10-CM | POA: Diagnosis not present

## 2021-01-14 DIAGNOSIS — M898X9 Other specified disorders of bone, unspecified site: Secondary | ICD-10-CM | POA: Diagnosis present

## 2021-01-14 DIAGNOSIS — Z87891 Personal history of nicotine dependence: Secondary | ICD-10-CM

## 2021-01-14 DIAGNOSIS — D631 Anemia in chronic kidney disease: Secondary | ICD-10-CM | POA: Diagnosis present

## 2021-01-14 DIAGNOSIS — Z7902 Long term (current) use of antithrombotics/antiplatelets: Secondary | ICD-10-CM

## 2021-01-14 DIAGNOSIS — E782 Mixed hyperlipidemia: Secondary | ICD-10-CM | POA: Diagnosis present

## 2021-01-14 DIAGNOSIS — Z833 Family history of diabetes mellitus: Secondary | ICD-10-CM

## 2021-01-14 DIAGNOSIS — I69354 Hemiplegia and hemiparesis following cerebral infarction affecting left non-dominant side: Secondary | ICD-10-CM

## 2021-01-14 DIAGNOSIS — I120 Hypertensive chronic kidney disease with stage 5 chronic kidney disease or end stage renal disease: Secondary | ICD-10-CM | POA: Diagnosis not present

## 2021-01-14 DIAGNOSIS — Z888 Allergy status to other drugs, medicaments and biological substances status: Secondary | ICD-10-CM

## 2021-01-14 HISTORY — DX: End stage renal disease: N18.6

## 2021-01-14 HISTORY — DX: Essential (primary) hypertension: I10

## 2021-01-14 HISTORY — DX: End stage renal disease: Z99.2

## 2021-01-14 LAB — CBC WITH DIFFERENTIAL/PLATELET
Abs Immature Granulocytes: 0.07 10*3/uL (ref 0.00–0.07)
Basophils Absolute: 0.1 10*3/uL (ref 0.0–0.1)
Basophils Relative: 1 %
Eosinophils Absolute: 0.1 10*3/uL (ref 0.0–0.5)
Eosinophils Relative: 1 %
HCT: 36.5 % (ref 36.0–46.0)
Hemoglobin: 11.2 g/dL — ABNORMAL LOW (ref 12.0–15.0)
Immature Granulocytes: 1 %
Lymphocytes Relative: 18 %
Lymphs Abs: 2.2 10*3/uL (ref 0.7–4.0)
MCH: 28.9 pg (ref 26.0–34.0)
MCHC: 30.7 g/dL (ref 30.0–36.0)
MCV: 94.3 fL (ref 80.0–100.0)
Monocytes Absolute: 1.4 10*3/uL — ABNORMAL HIGH (ref 0.1–1.0)
Monocytes Relative: 12 %
Neutro Abs: 8 10*3/uL — ABNORMAL HIGH (ref 1.7–7.7)
Neutrophils Relative %: 67 %
Platelets: 300 10*3/uL (ref 150–400)
RBC: 3.87 MIL/uL (ref 3.87–5.11)
RDW: 16.3 % — ABNORMAL HIGH (ref 11.5–15.5)
WBC: 11.9 10*3/uL — ABNORMAL HIGH (ref 4.0–10.5)
nRBC: 0 % (ref 0.0–0.2)

## 2021-01-14 LAB — COMPREHENSIVE METABOLIC PANEL
ALT: 21 U/L (ref 0–44)
AST: 22 U/L (ref 15–41)
Albumin: 3.1 g/dL — ABNORMAL LOW (ref 3.5–5.0)
Alkaline Phosphatase: 83 U/L (ref 38–126)
Anion gap: 17 — ABNORMAL HIGH (ref 5–15)
BUN: 41 mg/dL — ABNORMAL HIGH (ref 8–23)
CO2: 21 mmol/L — ABNORMAL LOW (ref 22–32)
Calcium: 7.9 mg/dL — ABNORMAL LOW (ref 8.9–10.3)
Chloride: 100 mmol/L (ref 98–111)
Creatinine, Ser: 5.48 mg/dL — ABNORMAL HIGH (ref 0.44–1.00)
GFR, Estimated: 8 mL/min — ABNORMAL LOW (ref >60)
Glucose, Bld: 170 mg/dL — ABNORMAL HIGH (ref 70–99)
Potassium: 3.7 mmol/L (ref 3.5–5.1)
Sodium: 138 mmol/L (ref 135–145)
Total Bilirubin: 0.4 mg/dL (ref 0.3–1.2)
Total Protein: 6.7 g/dL (ref 6.5–8.1)

## 2021-01-14 LAB — RESP PANEL BY RT-PCR (FLU A&B, COVID) ARPGX2
Influenza A by PCR: NEGATIVE
Influenza B by PCR: NEGATIVE
SARS Coronavirus 2 by RT PCR: NEGATIVE

## 2021-01-14 NOTE — ED Triage Notes (Signed)
Here with family from home with cc of cough congestion sob and cp. For 1 week. Went to pcp and started on abx but its not better. Hx of cva with left side paralysis

## 2021-01-14 NOTE — ED Provider Notes (Signed)
[facility] EMERGENCY DEPARTMENT Provider Note   CSN: [id number] Arrival date & time: 01/14/21  1908     History Chief Complaint  Patient presents with   Shortness of Breath    Kelli [name] is a 70 y.o. female.   Shortness of Breath Associated symptoms: no abdominal pain and no chest pain   Patient presents with shortness of breath and cough.  Has had for around the last week.  Started on cough medicine along with doxycycline and Augmentin by PCP.  Reportedly did not have an x-ray because she cannot stand to be at the x-ray machine.  There was plan to follow-up in a couple days with PCP, however was told to come to ER if things got worse.  States things are getting worse.  Still coughing with some clear sputum production.  Fatigue.  Some fevers.  She is a dialysis patient was dialyzed yesterday and is due to be dialyzed tomorrow.    Past Medical History:  Diagnosis Date   Anemia    Anginal pain (HCC)    Anxiety    Arthritis    Carotid artery occlusion    Occluded RICA, status post left CEA  August 2014 - Dr. 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   CHF (congestive heart failure) (HCC)    CKD (chronic kidney disease) stage 3, GFR 30-59 ml/min (HCC)    dialysis T/Th/sa   Closed dislocation of left humerus 07/26/2013   Depression    DM (diabetes mellitus), type 2 (HCC)    Dyspnea    with exertion   Dysrhythmia    Essential hypertension, benign    Fibromyalgia    Headache    History of kidney stones    Mixed hyperlipidemia    Multiple gastric ulcers    Pneumonia    Stroke [facility])    pt states she cannot walk, left arm weakness   Urinary incontinence     Patient Active Problem List   Diagnosis Date Noted   Adhesive capsulitis of shoulder 07/24/2020   Essential hypertension 07/03/2020   Hemodialysis catheter dysfunction, initial encounter ([place]) 07/02/2020    Mixed diabetic hyperlipidemia associated with type 2 diabetes mellitus ([place]) 07/02/2020   Ischemic chest pain ([place]) 09/11/2019   Chest pain 09/11/2019   Impingement syndrome of shoulder region 12/06/2017   History of arthroscopic procedure on shoulder 11/08/2017   Rotator cuff impingement syndrome 08/25/2017   Occult GI bleeding 01/28/2017   TIA (transient ischemic attack) 01/28/2017   Congestive heart failure ([place])    ESRD on hemodialysis ([place])    Pressure injury of skin 01/23/2017   ESRD (end stage renal disease) on dialysis ([place])    Gastritis and duodenitis    Acute metabolic encephalopathy 95/62/1308   Palliative care encounter    Goals of care, counseling/discussion    DNR (do not resuscitate) discussion    Acute renal failure superimposed on stage 4 chronic kidney disease ([place]) 01/09/2017   Precordial pain    CKD (chronic kidney disease), stage IV ([place]) 01/07/2017   Acute respiratory failure with hypoxia ([place]) 01/07/2017   Anemia 01/06/2017   Type 2 diabetes mellitus with chronic kidney disease on chronic dialysis, with long-term current use of insulin ([place]) 01/06/2017   Onychomycosis 01/05/2017   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   B12 deficiency 11/18/2016   Anemia in chronic kidney disease 11/18/2016   PUD (peptic ulcer disease)  10/13/2016   Pacemaker 10/13/2016   Acute on chronic diastolic CHF (congestive heart failure) ([place]) 10/13/2016   Elevated troponin    Hypovolemic shock ([place]) 04/26/2014   Upper GI bleed    Leukocytosis, unspecified 07/30/2013   Thrombocytosis 07/30/2013   Leukocytosis 07/30/2013   Subluxation of left shoulder joint 07/27/2013   Closed dislocation of left humerus 07/26/2013   Normocytic anemia 07/25/2013   Spastic hemiplegia affecting nondominant side ([place]) 07/06/2013   Alterations of sensations, late effect of cerebrovascular disease(438.6) 07/06/2013   Alterations of sensations, late effect of cerebrovascular disease 07/06/2013   UTI  (urinary tract infection) 06/15/2013   Edema 05/28/2013   Knee pain 05/28/2013   Carotid stenosis, bilateral 05/22/2013   Fall at nursing home 05/20/2013   Hip pain 05/20/2013   Pain in joint, lower leg 05/19/2013   Chest congestion 05/12/2013   E. coli UTI 05/07/2013   Left shoulder pain due to subluxation and/or adhesive capsulitis.  05/07/2013   Pain in left shoulder 05/07/2013   Acute CVA (cerebrovascular accident): R PCA infarct per MRI 04/13/13 04/13/2013   Cerebral infarction ([place]) 04/13/2013   CVA (cerebral infarction) 04/08/2013   Acute ischemic stroke ([place]) 04/08/2013   Diplopia 04/08/2013   Left-sided weakness 04/08/2013   Vertigo 04/08/2013   Paresthesias in left hand 04/08/2013   Cerebellar stroke ([place]) 04/08/2013   Dizziness and giddiness 04/08/2013   Stroke ([place]) 04/08/2013   Depression 04/06/2013   Peripheral arterial disease ([place]) 03/16/2013   History of stroke 01/30/2013   CKD (chronic kidney disease) stage 3, GFR 30-59 ml/min (HCC) 01/16/2013   Generalized anxiety disorder 12/08/2012   Lumbago 12/08/2012   Anxiety state 12/08/2012   Urinary frequency 11/14/2012   Occlusion and stenosis of carotid artery with cerebral infarction 10/05/2012   Cerebral infarction due to unspecified occlusion or stenosis of unspecified carotid arteries 10/05/2012   Tobacco abuse 10/04/2012   Obesity 10/04/2012    Past Surgical History:  Procedure Laterality Date   A/V FISTULAGRAM N/A 06/06/2019   Procedure: A/V FISTULAGRAM;  Surgeon: Marty Heck, MD;  Location: [place] [facility];  Service: Cardiovascular;  Laterality: N/A;   AV FISTULA PLACEMENT Left 04/15/2017   Procedure: ARTERIOVENOUS (AV) FISTULA CREATION LEFT ARM;  Surgeon: Serafina Mitchell, MD;  Location: [place];  Service: Vascular;  Laterality: Left;   AV FISTULA PLACEMENT Left 05/09/2017   Procedure: INSERTION OF ARTERIOVENOUS (AV) GORE-TEX GRAFT LEFT UPPER ARM;  Surgeon: [name], MD;  Location: [place];   Service: Vascular;  Laterality: Left;   AV FISTULA PLACEMENT Right 09/06/2018   Procedure: INSERTION OF ARTERIOVENOUS (AV) GORE-TEX GRAFT RIGHT  ARM;  Surgeon: [name], MD;  Location: [place];  Service: Vascular;  Laterality: Right;   AV FISTULA PLACEMENT Right 07/03/2020   Procedure: Removal of right upper arm Gortex graft;  Surgeon: Waynetta Sandy, MD;  Location: [place];  Service: Vascular;  Laterality: Right;   [place] Right 08/09/2018   Procedure: BASILIC VEIN TRANSPOSITION 1st Stage;  Surgeon: [name], MD;  Location: [place];  Service: Vascular;  Laterality: Right;   BIOPSY  01/22/2017   Procedure: BIOPSY;  Surgeon: Danie Binder, MD;  [facility];  Service: Endoscopy;;  gastric   COMBINED HYSTERECTOMY VAGINAL W/ MMK / A&P REPAIR  1981   ENDARTERECTOMY Left 10/06/2012   Procedure: Carotid Endarterectomy with Finesse patch angioplasty;  Surgeon: [name], MD;  Location: [place];  Service: Vascular;  Laterality:  Left;   ESOPHAGOGASTRODUODENOSCOPY N/A 04/26/2014   Procedure: ESOPHAGOGASTRODUODENOSCOPY (EGD);  Surgeon: Lear Ng, MD;  Location: [facility];  Service: Endoscopy;  Laterality: N/A;   ESOPHAGOGASTRODUODENOSCOPY N/A 01/22/2017   Procedure: ESOPHAGOGASTRODUODENOSCOPY (EGD);  Surgeon: Danie Binder, MD;  [facility];  Service: Endoscopy;  Laterality: N/A;   FLEXIBLE SIGMOIDOSCOPY N/A 01/22/2017   Procedure: FLEXIBLE SIGMOIDOSCOPY;  Surgeon: Danie Binder, MD;  [facility];  Service: Endoscopy;  Laterality: N/A;   INSERTION OF DIALYSIS CATHETER Right 07/03/2020   Procedure: INSERTION OF DIALYSIS CATHETER and exchange of right internal jugular dialysis catheter;  Surgeon: Waynetta Sandy, MD;  Location: [place];  Service: Vascular;  Laterality: Right;   IR CV LINE INJECTION  02/04/2017   IR CV LINE INJECTION  04/28/2018   IR CV LINE INJECTION  05/12/2018   IR CV LINE INJECTION  05/18/2018   IR  FLUORO GUIDE CV LINE LEFT  01/14/2017   IR FLUORO GUIDE CV LINE LEFT  04/25/2017   IR FLUORO GUIDE CV LINE LEFT  05/13/2017   IR FLUORO GUIDE CV LINE LEFT  03/02/2018   IR FLUORO GUIDE CV LINE LEFT  04/28/2018   IR FLUORO GUIDE CV LINE LEFT  05/12/2018   IR FLUORO GUIDE CV LINE LEFT  05/18/2018   IR FLUORO GUIDE CV LINE LEFT  06/23/2018   IR FLUORO GUIDE CV LINE LEFT  08/02/2018   IR FLUORO GUIDE CV LINE RIGHT  08/22/2020   IR PTA VENOUS EXCEPT DIALYSIS CIRCUIT  04/28/2018   IR PTA VENOUS EXCEPT DIALYSIS CIRCUIT  05/12/2018   IR PTA VENOUS EXCEPT DIALYSIS CIRCUIT  08/22/2020   IR REMOVAL TUN CV CATH W/O FL  07/08/2017   IR REMOVAL TUN CV CATH W/O FL  10/11/2018   IR TRANSCATH RETRIEVAL FB INCL GUIDANCE (MS)  05/12/2018   IR US GUIDE VASC ACCESS LEFT  01/14/2017   LOOP RECORDER IMPLANT  04/16/13   MDT LinQ implanted for cryptogenic stroke   PERIPHERAL VASCULAR INTERVENTION Right 06/06/2019   Procedure: PERIPHERAL VASCULAR INTERVENTION;  Surgeon: Marty Heck, MD;  Location: [place] [facility];  Service: Cardiovascular;  Laterality: Right;   [name] WITHOUT CARDIOVERSION N/A 04/16/2013   Procedure: TRANSESOPHAGEAL ECHOCARDIOGRAM ([name]);  Surgeon: Josue Hector, MD;  Location: [place];  Service: Cardiovascular;  Laterality: N/A;   THROMBECTOMY AND REVISION OF ARTERIOVENTOUS (AV) GORETEX  GRAFT Left 07/24/2017   Procedure: THROMBECTOMY OF LEFT UPPER ARM ARTERIOVENTOUS (AV) GRAFT;  Surgeon: Conrad Elberta, MD;  Location: [place];  Service: Vascular;  Laterality: Left;   URETHRAL DILATION  1980's   [place]   "partial" (10/04/2012)     OB History   No obstetric history on file.     Family History  Problem Relation Age of Onset   Diabetes Mother    Diabetes Father    Hypertension Sister    Diabetes Brother    Seizures Son    Kidney disease Maternal Grandmother    Hypertension Maternal Grandmother    Heart disease Maternal Grandfather    Diabetes Paternal Grandmother    Heart  disease Paternal Grandfather     Social History   Tobacco Use   Smoking status: Former    Packs/day: 1.00    Years: 44.00    Pack years: 44.00    Types: Cigarettes    Quit date: 10/04/2019    Years since quitting: 1.2   Smokeless tobacco: Never  Vaping Use  Vaping Use: Never used  Substance Use Topics   Alcohol use: No    Alcohol/week: 0.0 standard drinks   Drug use: No    Home Medications Prior to Admission medications   Medication Sig Start Date End Date Taking? Authorizing Provider  acetaminophen (TYLENOL) 325 MG tablet Take 2 tablets (650 mg total) by mouth every 6 (six) hours as needed for mild pain (or Fever >/= 101). 07/05/20  Yes Elgergawy, Silver Huguenin, MD  amoxicillin-clavulanate (AUGMENTIN) 500-125 MG tablet Take 1 tablet by mouth 2 (two) times daily. 01/06/21  Yes [provider]  atorvastatin (LIPITOR) 40 MG tablet Take 40 mg by mouth every evening.  05/17/18  Yes [provider]  benzonatate (TESSALON) 100 MG capsule Take 100 mg by mouth 3 (three) times daily as needed. 01/06/21  Yes [provider]  bismuth subsalicylate (PEPTO BISMOL) 262 MG/15ML suspension Take 30 mLs by mouth every 6 (six) hours as needed for indigestion.   Yes [provider]  clopidogrel (PLAVIX) 75 MG tablet Take 1 tablet (75 mg total) by mouth daily. Patient taking differently: Take 75 mg by mouth every evening. 01/31/17  Yes Kathie Dike, MD  doxycycline (VIBRA-TABS) 100 MG tablet Take 100 mg by mouth 2 (two) times daily. 01/12/21  Yes [provider]  hydrALAZINE (APRESOLINE) 50 MG tablet Take 1 tablet (50 mg total) by mouth 3 (three) times daily. Patient taking differently: Take 50 mg by mouth 2 (two) times daily. 10/20/16  Yes Cherene Altes, MD  insulin glargine (LANTUS) 100 UNIT/ML injection Inject 15 Units into the skin daily.   Yes [provider]  loperamide (IMODIUM) 2 MG capsule Take 2 mg by mouth as needed for diarrhea or loose  stools.   Yes [provider]  metoprolol succinate (TOPROL-XL) 100 MG 24 hr tablet Take 1 tablet (100 mg total) by mouth daily. Patient taking differently: Take 100 mg by mouth every evening. 07/24/17  Yes Conrad Nice, MD  torsemide (DEMADEX) 20 MG tablet Take 40 mg by mouth daily. 08/27/19  Yes [provider]  venlafaxine XR (EFFEXOR-XR) 150 MG 24 hr capsule Take 150 mg by mouth daily.    Yes [provider]  amLODipine (NORVASC) 10 MG tablet Take 10 mg by mouth every evening.  Patient not taking: Reported on 01/14/2021    [provider]  azithromycin (ZITHROMAX) 250 MG tablet TAKE 2 TABLETS BY MOUTH ON DAY 1, THEN TAKE 1 TABLET DAILY ON DAYS 2-5 Patient not taking: No sig reported 01/01/21   [provider]  clonazePAM (KLONOPIN) 0.5 MG tablet Take 0.5 tablets (0.25 mg total) by mouth daily. Patient not taking: No sig reported 07/05/20   Elgergawy, Silver Huguenin, MD  Insulin Glargine (BASAGLAR KWIKPEN) 100 UNIT/ML SOPN Inject 15 Units into the skin daily. Patient not taking: Reported on 01/14/2021    [provider]  methocarbamol (ROBAXIN) 500 MG tablet Take 1 tablet (500 mg total) by mouth every 6 (six) hours as needed for muscle spasms. Patient not taking: Reported on 01/14/2021 09/13/19   Shelly Coss, MD    Allergies    Hydrochlorothiazide  Review of Systems   Review of Systems  Constitutional:  Positive for appetite change and fatigue.  HENT:  Positive for congestion.   Respiratory:  Positive for shortness of breath.   Cardiovascular:  Negative for chest pain.  Gastrointestinal:  Negative for abdominal pain.  Genitourinary:  Negative for flank pain.  Musculoskeletal:  Negative for back pain.  Skin:  Negative for wound.  Neurological:  Positive for weakness.       Chronic left-sided weakness.   Physical Exam Updated Vital Signs BP (!) 164/69   Pulse 91   Temp 98 F (36.7 C) (Oral)   Resp 18   Ht 5\' 5"  (1.651 m)   Wt  90.7 kg   SpO2 96%   BMI 33.28 kg/m   Physical Exam Vitals and nursing note reviewed.  HENT:     Head: Normocephalic.  Cardiovascular:     Rate and Rhythm: Regular rhythm.  Pulmonary:     Comments: Harsh breath sounds on the right side. Chest:     Comments: Right chest wall tunneled catheter. Abdominal:     Tenderness: There is no abdominal tenderness.  Musculoskeletal:     Cervical back: Neck supple.     Right lower leg: No edema.     Left lower leg: No edema.  Neurological:     Mental Status: She is alert.     Comments: Awake and appropriate.  Chronic weakness to left side due to previous stroke.    ED Results / Procedures / Treatments   Labs (all labs ordered are listed, but only abnormal results are displayed) Labs Reviewed  COMPREHENSIVE METABOLIC PANEL - Abnormal; Notable for the following components:      Result Value   CO2 21 (*)    Glucose, Bld 170 (*)    BUN 41 (*)    Creatinine, Ser 5.48 (*)    Calcium 7.9 (*)    Albumin 3.1 (*)    GFR, Estimated 8 (*)    Anion gap 17 (*)    All other components within normal limits  CBC WITH DIFFERENTIAL/PLATELET - Abnormal; Notable for the following components:   WBC 11.9 (*)    Hemoglobin 11.2 (*)    RDW 16.3 (*)    Neutro Abs 8.0 (*)    Monocytes Absolute 1.4 (*)    All other components within normal limits  RESP PANEL BY RT-PCR (FLU A&B, COVID) [id number]    EKG None  Radiology DG Chest Portable 1 View  Result Date: 01/14/2021 CLINICAL DATA:  Shortness of breath and chest congestion.  No fever. EXAM: PORTABLE CHEST 1 VIEW COMPARISON:  Portable chest 07/03/2020. FINDINGS: Right IJ dialysis catheter tip is in the upper right atrium. Left chest loop recorder device is again noted as well. A vascular stent in the right axilla is also redemonstrated. There is mild cardiomegaly with normal caliber central vasculature. There is patchy aortic calcific plaque. Small amount of opacity noted in the left costophrenic angle  and could indicate atelectasis, small infiltrate or minimal pleural effusion. The remaining lungs are clear.  Generalized osteopenia. IMPRESSION: Small opacity left costophrenic angle area, which could be due to atelectasis, small pneumonia or minimal pleural effusion. Follow-up PA and lateral chest recommended. Electronically Signed   By: Telford Nab M.D.   On: 01/14/2021 20:06    Procedures Procedures   Medications Ordered in ED Medications - No data to display  ED Course  I have reviewed the triage vital signs and the nursing notes.  Pertinent labs & imaging results that were available during my care of the patient were reviewed by me and considered in my medical decision making (see chart for details).    MDM Rules/Calculators/A&P                           Patient presents  with shortness of breath and cough.  Has been on Augmentin for over a week now.  Just also started doxycycline.  Pharmacy did not initially give him all the medicines.  However has having worsening shortness of breath.  X-ray showed possible pneumonia.  Is also dialysis patient due for dialysis tomorrow.  Continues to be fatigued and short of breath.  Not hypoxic however.  However with shortness of breath fatigue worsening cough despite antibiotic treatment I feels the patient would benefit from admission to the hospital.  Will discuss with hospitalist. Final Clinical Impression(s) / ED Diagnoses Final diagnoses:  Community acquired pneumonia of right lower lobe of lung  End stage renal disease on dialysis [facility])    Rx / DC Orders ED Discharge Orders     None        Davonna Belling, MD 01/15/21 0000

## 2021-01-14 NOTE — ED Notes (Signed)
Lab, x-ray, and registration at bedside

## 2021-01-15 ENCOUNTER — Other Ambulatory Visit (HOSPITAL_COMMUNITY): Payer: Medicare Other

## 2021-01-15 ENCOUNTER — Encounter (HOSPITAL_COMMUNITY): Payer: Self-pay | Admitting: Internal Medicine

## 2021-01-15 DIAGNOSIS — Z87442 Personal history of urinary calculi: Secondary | ICD-10-CM | POA: Diagnosis not present

## 2021-01-15 DIAGNOSIS — E8809 Other disorders of plasma-protein metabolism, not elsewhere classified: Secondary | ICD-10-CM | POA: Diagnosis present

## 2021-01-15 DIAGNOSIS — Z8711 Personal history of peptic ulcer disease: Secondary | ICD-10-CM | POA: Diagnosis not present

## 2021-01-15 DIAGNOSIS — M797 Fibromyalgia: Secondary | ICD-10-CM | POA: Diagnosis present

## 2021-01-15 DIAGNOSIS — Z992 Dependence on renal dialysis: Secondary | ICD-10-CM | POA: Diagnosis not present

## 2021-01-15 DIAGNOSIS — D631 Anemia in chronic kidney disease: Secondary | ICD-10-CM | POA: Diagnosis present

## 2021-01-15 DIAGNOSIS — I69354 Hemiplegia and hemiparesis following cerebral infarction affecting left non-dominant side: Secondary | ICD-10-CM | POA: Diagnosis not present

## 2021-01-15 DIAGNOSIS — N186 End stage renal disease: Secondary | ICD-10-CM | POA: Diagnosis present

## 2021-01-15 DIAGNOSIS — M898X9 Other specified disorders of bone, unspecified site: Secondary | ICD-10-CM | POA: Diagnosis present

## 2021-01-15 DIAGNOSIS — J189 Pneumonia, unspecified organism: Principal | ICD-10-CM

## 2021-01-15 DIAGNOSIS — Z833 Family history of diabetes mellitus: Secondary | ICD-10-CM | POA: Diagnosis not present

## 2021-01-15 DIAGNOSIS — E46 Unspecified protein-calorie malnutrition: Secondary | ICD-10-CM

## 2021-01-15 DIAGNOSIS — R0602 Shortness of breath: Secondary | ICD-10-CM | POA: Diagnosis not present

## 2021-01-15 DIAGNOSIS — E782 Mixed hyperlipidemia: Secondary | ICD-10-CM

## 2021-01-15 DIAGNOSIS — I4819 Other persistent atrial fibrillation: Secondary | ICD-10-CM | POA: Diagnosis present

## 2021-01-15 DIAGNOSIS — F32A Depression, unspecified: Secondary | ICD-10-CM | POA: Diagnosis present

## 2021-01-15 DIAGNOSIS — I5032 Chronic diastolic (congestive) heart failure: Secondary | ICD-10-CM | POA: Diagnosis present

## 2021-01-15 DIAGNOSIS — J188 Other pneumonia, unspecified organism: Secondary | ICD-10-CM

## 2021-01-15 DIAGNOSIS — N250 Renal osteodystrophy: Secondary | ICD-10-CM | POA: Diagnosis not present

## 2021-01-15 DIAGNOSIS — Z87891 Personal history of nicotine dependence: Secondary | ICD-10-CM | POA: Diagnosis not present

## 2021-01-15 DIAGNOSIS — I10 Essential (primary) hypertension: Secondary | ICD-10-CM | POA: Diagnosis not present

## 2021-01-15 DIAGNOSIS — I482 Chronic atrial fibrillation, unspecified: Secondary | ICD-10-CM | POA: Diagnosis present

## 2021-01-15 DIAGNOSIS — I132 Hypertensive heart and chronic kidney disease with heart failure and with stage 5 chronic kidney disease, or end stage renal disease: Secondary | ICD-10-CM | POA: Diagnosis present

## 2021-01-15 DIAGNOSIS — Z8673 Personal history of transient ischemic attack (TIA), and cerebral infarction without residual deficits: Secondary | ICD-10-CM | POA: Diagnosis not present

## 2021-01-15 DIAGNOSIS — E1129 Type 2 diabetes mellitus with other diabetic kidney complication: Secondary | ICD-10-CM | POA: Diagnosis not present

## 2021-01-15 DIAGNOSIS — E1122 Type 2 diabetes mellitus with diabetic chronic kidney disease: Secondary | ICD-10-CM | POA: Diagnosis present

## 2021-01-15 DIAGNOSIS — Z8249 Family history of ischemic heart disease and other diseases of the circulatory system: Secondary | ICD-10-CM | POA: Diagnosis not present

## 2021-01-15 DIAGNOSIS — E1165 Type 2 diabetes mellitus with hyperglycemia: Secondary | ICD-10-CM | POA: Diagnosis present

## 2021-01-15 DIAGNOSIS — I4891 Unspecified atrial fibrillation: Secondary | ICD-10-CM

## 2021-01-15 DIAGNOSIS — Z6826 Body mass index (BMI) 26.0-26.9, adult: Secondary | ICD-10-CM | POA: Diagnosis not present

## 2021-01-15 DIAGNOSIS — E441 Mild protein-calorie malnutrition: Secondary | ICD-10-CM | POA: Diagnosis present

## 2021-01-15 DIAGNOSIS — Z20822 Contact with and (suspected) exposure to covid-19: Secondary | ICD-10-CM | POA: Diagnosis present

## 2021-01-15 LAB — GLUCOSE, CAPILLARY
Glucose-Capillary: 116 mg/dL — ABNORMAL HIGH (ref 70–99)
Glucose-Capillary: 115 mg/dL — ABNORMAL HIGH (ref 70–99)
Glucose-Capillary: 114 mg/dL — ABNORMAL HIGH (ref 70–99)
Glucose-Capillary: 159 mg/dL — ABNORMAL HIGH (ref 70–99)

## 2021-01-15 LAB — COMPREHENSIVE METABOLIC PANEL
ALT: 19 U/L (ref 0–44)
AST: 15 U/L (ref 15–41)
Albumin: 2.9 g/dL — ABNORMAL LOW (ref 3.5–5.0)
Alkaline Phosphatase: 77 U/L (ref 38–126)
Anion gap: 14 (ref 5–15)
BUN: 42 mg/dL — ABNORMAL HIGH (ref 8–23)
CO2: 22 mmol/L (ref 22–32)
Calcium: 7.7 mg/dL — ABNORMAL LOW (ref 8.9–10.3)
Chloride: 101 mmol/L (ref 98–111)
Creatinine, Ser: 5.7 mg/dL — ABNORMAL HIGH (ref 0.44–1.00)
GFR, Estimated: 8 mL/min — ABNORMAL LOW (ref >60)
Glucose, Bld: 128 mg/dL — ABNORMAL HIGH (ref 70–99)
Potassium: 3.5 mmol/L (ref 3.5–5.1)
Sodium: 137 mmol/L (ref 135–145)
Total Bilirubin: 0.7 mg/dL (ref 0.3–1.2)
Total Protein: 6.3 g/dL — ABNORMAL LOW (ref 6.5–8.1)

## 2021-01-15 LAB — CBC
HCT: 35.4 % — ABNORMAL LOW (ref 36.0–46.0)
Hemoglobin: 10.6 g/dL — ABNORMAL LOW (ref 12.0–15.0)
MCH: 28.5 pg (ref 26.0–34.0)
MCHC: 29.9 g/dL — ABNORMAL LOW (ref 30.0–36.0)
MCV: 95.2 fL (ref 80.0–100.0)
Platelets: 296 10*3/uL (ref 150–400)
RBC: 3.72 MIL/uL — ABNORMAL LOW (ref 3.87–5.11)
RDW: 16.2 % — ABNORMAL HIGH (ref 11.5–15.5)
WBC: 11.2 10*3/uL — ABNORMAL HIGH (ref 4.0–10.5)
nRBC: 0 % (ref 0.0–0.2)

## 2021-01-15 LAB — MAGNESIUM: Magnesium: 1.8 mg/dL (ref 1.7–2.4)

## 2021-01-15 LAB — HEMOGLOBIN A1C
Hgb A1c MFr Bld: 5 % (ref 4.8–5.6)
Mean Plasma Glucose: 96.8 mg/dL

## 2021-01-15 LAB — PROTIME-INR
INR: 1.2 (ref 0.8–1.2)
Prothrombin Time: 14.8 seconds (ref 11.4–15.2)

## 2021-01-15 LAB — PROCALCITONIN: Procalcitonin: 2.08 ng/mL

## 2021-01-15 LAB — PHOSPHORUS: Phosphorus: 7.9 mg/dL — ABNORMAL HIGH (ref 2.5–4.6)

## 2021-01-15 LAB — APTT: aPTT: 31 seconds (ref 24–36)

## 2021-01-15 MED ORDER — CALCIUM ACETATE (PHOS BINDER) 667 MG PO CAPS
1334.0000 mg | ORAL_CAPSULE | Freq: Three times a day (TID) | ORAL | Status: DC
  Administered 2021-01-15 – 2021-01-17 (×5): 1334 mg via ORAL
  Filled 2021-01-15 (×5): qty 2

## 2021-01-15 MED ORDER — FLUTICASONE PROPIONATE 50 MCG/ACT NA SUSP
2.0000 | Freq: Every day | NASAL | Status: DC
Start: 2021-01-15 — End: 2021-01-17
  Administered 2021-01-15 – 2021-01-17 (×3): 2 via NASAL
  Filled 2021-01-15: qty 16

## 2021-01-15 MED ORDER — IPRATROPIUM-ALBUTEROL 0.5-2.5 (3) MG/3ML IN SOLN
3.0000 mL | RESPIRATORY_TRACT | Status: DC | PRN
  Administered 2021-01-17: 3 mL via RESPIRATORY_TRACT
  Filled 2021-01-15: qty 3

## 2021-01-15 MED ORDER — IPRATROPIUM-ALBUTEROL 0.5-2.5 (3) MG/3ML IN SOLN
3.0000 mL | Freq: Three times a day (TID) | RESPIRATORY_TRACT | Status: DC
  Administered 2021-01-15 – 2021-01-17 (×6): 3 mL via RESPIRATORY_TRACT
  Filled 2021-01-15 (×6): qty 3

## 2021-01-15 MED ORDER — FAMOTIDINE 20 MG PO TABS
20.0000 mg | ORAL_TABLET | Freq: Every day | ORAL | Status: DC
  Administered 2021-01-15 – 2021-01-17 (×3): 20 mg via ORAL
  Filled 2021-01-15 (×3): qty 1

## 2021-01-15 MED ORDER — SODIUM CHLORIDE 0.9 % IV SOLN
500.0000 mg | Freq: Every day | INTRAVENOUS | Status: DC
  Administered 2021-01-15 – 2021-01-16 (×2): 500 mg via INTRAVENOUS
  Filled 2021-01-15 (×2): qty 500

## 2021-01-15 MED ORDER — INSULIN ASPART 100 UNIT/ML IJ SOLN: 0.0000 [IU] | Freq: Every day | INTRAMUSCULAR | Status: DC

## 2021-01-15 MED ORDER — APIXABAN 5 MG PO TABS
5.0000 mg | ORAL_TABLET | Freq: Two times a day (BID) | ORAL | Status: DC
  Administered 2021-01-15 – 2021-01-17 (×4): 5 mg via ORAL
  Filled 2021-01-15 (×4): qty 1

## 2021-01-15 MED ORDER — ATORVASTATIN CALCIUM 40 MG PO TABS
40.0000 mg | ORAL_TABLET | Freq: Every evening | ORAL | Status: DC
  Administered 2021-01-15 – 2021-01-16 (×2): 40 mg via ORAL
  Filled 2021-01-15 (×2): qty 1

## 2021-01-15 MED ORDER — SODIUM CHLORIDE 0.9 % IV SOLN
2.0000 g | INTRAVENOUS | Status: DC
  Administered 2021-01-15 – 2021-01-17 (×3): 2 g via INTRAVENOUS
  Filled 2021-01-15 (×3): qty 20

## 2021-01-15 MED ORDER — METOPROLOL SUCCINATE ER 50 MG PO TB24
100.0000 mg | ORAL_TABLET | Freq: Every day | ORAL | Status: DC
  Administered 2021-01-16: 100 mg via ORAL
  Filled 2021-01-15 (×3): qty 2

## 2021-01-15 MED ORDER — INSULIN GLARGINE-YFGN 100 UNIT/ML ~~LOC~~ SOLN
6.0000 [IU] | Freq: Every day | SUBCUTANEOUS | Status: DC
  Filled 2021-01-15 (×2): qty 0.06

## 2021-01-15 MED ORDER — SODIUM CHLORIDE 0.9 % IV SOLN
1.0000 g | Freq: Once | INTRAVENOUS | Status: AC
  Administered 2021-01-15: 1 g via INTRAVENOUS
  Filled 2021-01-15: qty 10

## 2021-01-15 MED ORDER — HEPARIN SODIUM (PORCINE) 1000 UNIT/ML IJ SOLN: 3800.0000 [IU] | Freq: Once | INTRAMUSCULAR | Status: DC

## 2021-01-15 MED ORDER — SODIUM CHLORIDE 0.9 % IV SOLN: 1.0000 g | INTRAVENOUS | Status: DC

## 2021-01-15 MED ORDER — IPRATROPIUM-ALBUTEROL 0.5-2.5 (3) MG/3ML IN SOLN: 3.0000 mL | Freq: Three times a day (TID) | RESPIRATORY_TRACT | Status: DC

## 2021-01-15 MED ORDER — DM-GUAIFENESIN ER 30-600 MG PO TB12
1.0000 | ORAL_TABLET | Freq: Two times a day (BID) | ORAL | Status: DC
  Administered 2021-01-15 – 2021-01-17 (×5): 1 via ORAL
  Filled 2021-01-15 (×5): qty 1

## 2021-01-15 MED ORDER — MOMETASONE FURO-FORMOTEROL FUM 200-5 MCG/ACT IN AERO
2.0000 | INHALATION_SPRAY | Freq: Two times a day (BID) | RESPIRATORY_TRACT | Status: DC
  Administered 2021-01-15 – 2021-01-16 (×4): 2 via RESPIRATORY_TRACT
  Filled 2021-01-15: qty 8.8

## 2021-01-15 MED ORDER — ALTEPLASE 2 MG IJ SOLR
INTRAMUSCULAR | Status: AC
  Administered 2021-01-15: 15:00:00 2 mg
  Filled 2021-01-15: qty 2

## 2021-01-15 MED ORDER — CLOPIDOGREL BISULFATE 75 MG PO TABS
75.0000 mg | ORAL_TABLET | Freq: Every day | ORAL | Status: DC
  Administered 2021-01-15: 10:00:00 75 mg via ORAL
  Filled 2021-01-15: qty 1

## 2021-01-15 MED ORDER — ENOXAPARIN SODIUM 30 MG/0.3ML IJ SOSY
30.0000 mg | PREFILLED_SYRINGE | INTRAMUSCULAR | Status: DC
  Administered 2021-01-15: 10:00:00 30 mg via SUBCUTANEOUS
  Filled 2021-01-15: qty 0.3

## 2021-01-15 MED ORDER — INSULIN ASPART 100 UNIT/ML IJ SOLN
0.0000 [IU] | Freq: Four times a day (QID) | INTRAMUSCULAR | Status: DC
  Administered 2021-01-15 – 2021-01-16 (×2): 1 [IU] via SUBCUTANEOUS

## 2021-01-15 MED ORDER — INSULIN ASPART 100 UNIT/ML IJ SOLN: 0.0000 [IU] | Freq: Three times a day (TID) | INTRAMUSCULAR | Status: DC

## 2021-01-15 MED ORDER — ONDANSETRON HCL 4 MG/2ML IJ SOLN
4.0000 mg | Freq: Four times a day (QID) | INTRAMUSCULAR | Status: DC | PRN
  Administered 2021-01-15 – 2021-01-17 (×3): 4 mg via INTRAVENOUS
  Filled 2021-01-15 (×2): qty 2

## 2021-01-15 MED ORDER — GLUCERNA SHAKE PO LIQD
237.0000 mL | Freq: Three times a day (TID) | ORAL | Status: DC
  Administered 2021-01-15: 16:00:00 237 mL via ORAL
  Filled 2021-01-15 (×2): qty 237

## 2021-01-15 MED ORDER — SODIUM CHLORIDE 0.9 % IV SOLN
500.0000 mg | Freq: Once | INTRAVENOUS | Status: AC
  Administered 2021-01-15: 500 mg via INTRAVENOUS
  Filled 2021-01-15: qty 500

## 2021-01-15 MED ORDER — LORATADINE 10 MG PO TABS
10.0000 mg | ORAL_TABLET | Freq: Every day | ORAL | Status: DC
  Administered 2021-01-15 – 2021-01-17 (×3): 10 mg via ORAL
  Filled 2021-01-15 (×3): qty 1

## 2021-01-15 MED ORDER — CHLORHEXIDINE GLUCONATE CLOTH 2 % EX PADS
6.0000 | MEDICATED_PAD | Freq: Every day | CUTANEOUS | Status: DC
  Administered 2021-01-15 – 2021-01-17 (×3): 6 via TOPICAL

## 2021-01-15 MED ORDER — ALTEPLASE 2 MG IJ SOLR: 2.0000 mg | Freq: Once | INTRAMUSCULAR | Status: AC

## 2021-01-15 NOTE — Progress Notes (Addendum)
PROGRESS NOTE    Kelli [name]  CZY:[id number] DOB: [birth year] DOA: 01/14/2021 PCP: Manon Hilding, MD    Chief Complaint  Patient presents with   Shortness of Breath    Brief Narrative Patient 70 year old female history of ESRD on HD Tuesday Thursday Saturday, history of cryptogenic stroke with left hemiplegia, bedbound, type 2 diabetes, chronic diastolic CHF, hypertension, hyperlipidemia, anxiety presented to the ED with 1 week of cough, sputum clear, congestion, shortness of breath seen by PCP trial of outpatient antibiotics however with no significant improvement patient presented to the ED.  COVID-19 PCR, influenza negative.  CBC noted a leukocytosis.  Patient noted to have sats of 91 to 96% on room air.  Chest x-ray done with small opacity noted in left costophrenic angle area could be due to atelectasis versus small pneumonia minimal pleural effusion.  Patient placed empirically on IV Rocephin and azithromycin to treat empirically for pneumonia.  Nephrology consulted for ongoing HD.  Patient noted to be in A. fib new onset no prior history and cardiology consulted.   Assessment & Plan:   Principal Problem:   CAP (community acquired pneumonia) Active Problems:   History of stroke   Hyperglycemia due to diabetes mellitus ([place])   Chronic diastolic CHF (congestive heart failure) (HCC)   End stage renal disease on dialysis [facility])   Essential hypertension   Hypoalbuminemia due to protein-calorie malnutrition ([place])   Mixed hyperlipidemia   New onset a-fib ([place])   #1 community-acquired pneumonia, POA -Patient presented with symptoms concerning for pneumonia with cough, sputum production, shortness of breath. -Chest x-ray done concerning for atelectasis versus pneumonia. -Urine Legionella antigen pending, urine strep pneumococcus antigen pending.  Patient pancultured. -Continue Mucinex, incentive spirometry, flutter valve, empiric IV Rocephin, IV azithromycin. -Placed on Pepcid,  Flonase, Claritin, scheduled duo nebs, Dulera. -Chest PT.  2.  Well-controlled type 2 diabetes mellitus with hyperglycemia -Last hemoglobin A1c 6.0 07/03/2020. -Repeat hemoglobin A1c 5.0 (01/15/2021). -CBG 116 this morning. -Patient sedated/drowsy and is/diet being changed to NPO. -Discontinue long-acting Semglee. -Continue SSI.  3.  New onset A. fib -EKG noted with A. fib rate controlled. -On review of chart patient noted to be in A. fib 06/2020 however also remained rate controlled at that time. -Patient last seen by cardiology 11/2019. -Patient with prior history of cryptogenic CVA, status post loop recorder and being followed by cardiology in the outpatient setting on Plavix. -2D echo from 09/12/2019 with severely dilated left atrium, EF of 60 to 65%,NWMA. -Patient with CHA2DS2-VASc 2 score 7. -Check a TSH.  Repeat 2D echo. -Currently rate controlled on Toprol-XL which we will continue. -Consulted cardiology, may need anticoagulation.  4.  Chronic diastolic CHF -Stable. -Patient looks a little more on the dry side. -Continue Toprol-XL, Lipitor.  5.  End-stage renal disease on HD ( T/TH/SAT) -Consulted with nephrology who are following and patient to be scheduled for hemodialysis today.  6.  Hypertension -Toprol-XL.  7.  Hyperlipidemia -Statin.  8.  History of CVA -Was on Lipitor and Plavix. -Patient noted to be in new onset A. fib last noted per EKG 06/2020. -Cardiology consulted for further evaluation, may need to be placed on anticoagulation. -Outpatient follow-up.  9.  Mild protein calorie malnutrition -Nutritional supplementation.   DVT prophylaxis: SCD Code Status: Full Family Communication: Updated husband at bedside. Disposition:   Status is: Inpatient  Remains inpatient appropriate because: Severity of illness       Consultants:  Cardiology pending Nephrology: Dr. Carolin Sicks 01/15/2021  Procedures:  Chest x-ray 01/14/2021  Antimicrobials:  IV  azithromycin 01/15/2021>>>> IV Rocephin 01/15/2021>>>>.   Subjective: Somnolent. Follow simple commands.  Husband at bedside.  Husband feels breathing/congestion has improved since admission.  Patient noted to be in A. fib currently rate controlled on telemetry.  Objective: Vitals:   01/15/21 0426 01/15/21 0946 01/15/21 1016 01/15/21 1058  BP: (!) 169/75 (!) 170/72  108/62  Pulse: 81 81  80  Resp: 18 20  20   Temp: 98.1 F (36.7 C)   98 F (36.7 C)  TempSrc:    Axillary  SpO2: 95% 94% 94% 95%  Weight: 72.7 kg   73 kg  Height: 5\' 5"  (1.651 m)       Intake/Output Summary (Last 24 hours) at 01/15/2021 1116 Last data filed at 01/15/2021 0500 Gross per 24 hour  Intake 588.19 ml  Output --  Net 588.19 ml   Filed Weights   01/14/21 1916 01/15/21 0426 01/15/21 1058  Weight: 90.7 kg 72.7 kg 73 kg    Examination:  General exam: Somnolent Respiratory system: Some coarse rhonchorous breath sounds anterior lung fields.  No wheezes, no crackles, no rhonchi.Marland Kitchen Respiratory effort normal. Cardiovascular system: Irregularly irregular.  No murmurs rubs or gallops.  No lower extremity edema. Gastrointestinal system: Abdomen is nondistended, soft and nontender. No organomegaly or masses felt. Normal bowel sounds heard. Central nervous system: Alert.  Left hemiplegia.  Right lower extremity weakness.   Extremities: Symmetric 5 x 5 power. Skin: No rashes, lesions or ulcers Psychiatry: Judgement and insight unable to assess.  Unable to assess mood and affect.     Data Reviewed: I have personally reviewed following labs and imaging studies  CBC: Recent Labs  Lab 01/14/21 1949 01/15/21 0404  WBC 11.9* 11.2*  NEUTROABS 8.0*  --   HGB 11.2* 10.6*  HCT 36.5 35.4*  MCV 94.3 95.2  PLT 300 539    Basic Metabolic Panel: Recent Labs  Lab 01/14/21 1949 01/15/21 0404  NA 138 137  K 3.7 3.5  CL 100 101  CO2 21* 22  GLUCOSE 170* 128*  BUN 41* 42*  CREATININE 5.48* 5.70*  CALCIUM  7.9* 7.7*  MG  --  1.8  PHOS  --  7.9*    GFR: Estimated Creatinine Clearance: 9.2 mL/min (A) (by C-G formula based on SCr of 5.7 mg/dL (H)).  Liver Function Tests: Recent Labs  Lab 01/14/21 1949 01/15/21 0404  AST 22 15  ALT 21 19  ALKPHOS 83 77  BILITOT 0.4 0.7  PROT 6.7 6.3*  ALBUMIN 3.1* 2.9*    CBG: Recent Labs  Lab 01/15/21 0732  GLUCAP 116*     Recent Results (from the past 240 hour(s))  Resp Panel by RT-PCR (Flu A&B, Covid) Nasopharyngeal Swab     Status: None   Collection Time: 01/14/21  7:35 PM   Specimen: Nasopharyngeal Swab; Nasopharyngeal(NP) swabs in vial transport medium  Result Value Ref Range Status   SARS Coronavirus 2 by RT PCR NEGATIVE NEGATIVE Final    Comment: (NOTE) SARS-CoV-2 target nucleic acids are NOT DETECTED.  The SARS-CoV-2 RNA is generally detectable in upper respiratory specimens during the acute phase of infection. The lowest concentration of SARS-CoV-2 viral copies this assay can detect is 138 copies/mL. A negative result does not preclude SARS-Cov-2 infection and should not be used as the sole basis for treatment or other patient management decisions. A negative result may occur with  improper specimen collection/handling, submission of specimen other than nasopharyngeal swab, presence of viral  mutation(s) within the areas targeted by this assay, and inadequate number of viral copies(<138 copies/mL). A negative result must be combined with clinical observations, patient history, and epidemiological information. The expected result is Negative.  Fact Sheet for Patients:  EntrepreneurPulse.com.au  Fact Sheet for Healthcare Providers:  IncredibleEmployment.be  This test is no t yet approved or cleared by the [place] FDA and  has been authorized for detection and/or diagnosis of SARS-CoV-2 by FDA under an Emergency Use Authorization (EUA). This EUA will remain  in effect (meaning this  test can be used) for the duration of the COVID-19 declaration under Section 564(b)(1) of the Act, 21 U.S.C.section 360bbb-3(b)(1), unless the authorization is terminated  or revoked sooner.       Influenza A by PCR NEGATIVE NEGATIVE Final   Influenza B by PCR NEGATIVE NEGATIVE Final    Comment: (NOTE) The Xpert Xpress SARS-CoV-2/FLU/RSV plus assay is intended as an aid in the diagnosis of influenza from Nasopharyngeal swab specimens and should not be used as a sole basis for treatment. Nasal washings and aspirates are unacceptable for Xpert Xpress SARS-CoV-2/FLU/RSV testing.  Fact Sheet for Patients: EntrepreneurPulse.com.au  Fact Sheet for Healthcare Providers: IncredibleEmployment.be  This test is not yet approved or cleared by the [place] FDA and has been authorized for detection and/or diagnosis of SARS-CoV-2 by FDA under an Emergency Use Authorization (EUA). This EUA will remain in effect (meaning this test can be used) for the duration of the COVID-19 declaration under Section 564(b)(1) of the Act, 21 U.S.C. section 360bbb-3(b)(1), unless the authorization is terminated or revoked.  Performed at [facility], [address], [place], [place] [address]          Radiology Studies: DG Chest Portable 1 View  Result Date: 01/14/2021 CLINICAL DATA:  Shortness of breath and chest congestion.  No fever. EXAM: PORTABLE CHEST 1 VIEW COMPARISON:  Portable chest 07/03/2020. FINDINGS: Right IJ dialysis catheter tip is in the upper right atrium. Left chest loop recorder device is again noted as well. A vascular stent in the right axilla is also redemonstrated. There is mild cardiomegaly with normal caliber central vasculature. There is patchy aortic calcific plaque. Small amount of opacity noted in the left costophrenic angle and could indicate atelectasis, small infiltrate or minimal pleural effusion. The remaining lungs are clear.   Generalized osteopenia. IMPRESSION: Small opacity left costophrenic angle area, which could be due to atelectasis, small pneumonia or minimal pleural effusion. Follow-up PA and lateral chest recommended. Electronically Signed   By: Telford Nab M.D.   On: 01/14/2021 20:06        Scheduled Meds:  apixaban  5 mg Oral BID   atorvastatin  40 mg Oral QPM   calcium acetate  1,334 mg Oral TID WC   Chlorhexidine Gluconate Cloth  6 each Topical Q0600   dextromethorphan-guaiFENesin  1 tablet Oral BID   famotidine  20 mg Oral Daily   feeding supplement (GLUCERNA SHAKE)  237 mL Oral TID BM   fluticasone  2 spray Each Nare Daily   insulin aspart  0-5 Units Subcutaneous QHS   insulin aspart  0-6 Units Subcutaneous TID WC   ipratropium-albuterol  3 mL Nebulization TID   loratadine  10 mg Oral Daily   metoprolol succinate  100 mg Oral Daily   mometasone-formoterol  2 puff Inhalation BID   Continuous Infusions:  azithromycin     cefTRIAXone (ROCEPHIN)  IV 2 g (01/15/21 0954)     LOS: 0 days  Time spent: 45 minutes  No charge    Irine Seal, MD [facility]   To contact the attending provider between 7A-7P or the covering provider during after hours 7P-7A, please log into the web site [email or web address] and access using universal [place] password for that web site. If you do not have the password, please call the hospital operator.  01/15/2021, 11:16 AM

## 2021-01-15 NOTE — Consult Note (Signed)
Cardiology Consultation:   Patient ID: Kelli [name]; [id number]; 11-01-1950   Admit date: 01/14/2021 Date of Consult: 01/15/2021  Primary Care Provider: Manon Hilding, MD Primary Cardiologist: New Primary Electrophysiologist: Cristopher Peru, MD   Patient Profile:   Kelli [name] is a 70 y.o. female with a history of hyperlipidemia, hypertension, ESRD on hemodialysis, type 2 diabetes mellitus, carotid artery disease, and previous strokes who is being seen today for the evaluation of newly recognized atrial fibrillation at the request of Dr. Grandville Silos.  History of Present Illness:   [name] is currently admitted to the hospital with recent productive cough, chest congestion, and shortness of breath.  She had been treated as an outpatient by her PCP with Augmentin and doxycycline, came to the ER due to worsening fatigue.  Found to be Influenza A and COVID-19 negative, but chest x-ray demonstrating possible small infiltrate in the left costophrenic angle and mildly hypoxic.  She is being treated for community-acquired pneumonia.  Presenting ECG shows rate controlled atrial fibrillation, newly recognized.  I reviewed previous tracings and note that she was actually in atrial fibrillation back in May of this year as well.  She has a prior history of cryptogenic stroke and in fact had an ILR in place with followed by Dr. Lovena Le that never demonstrated any clear-cut atrial fibrillation in years past, this has been nonfunctional and cannot be interrogated now.  Her CHA2DS2-VASc score is 7.  Echocardiogram from July 2021 revealed LVEF 60 to 65% with moderate LVH, severely dilated left atrium.  Past Medical History:  Diagnosis Date   Anemia    Anxiety    Arthritis    Carotid artery occlusion    Occluded RICA, status post left CEA  August 2014 - Dr. 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    Closed dislocation of left humerus 07/26/2013   Depression    DM (diabetes mellitus), type 2 ([place])    ESRD on hemodialysis ([place])    Essential hypertension    Fibromyalgia    Headache    History of kidney stones    Mixed hyperlipidemia    Multiple gastric ulcers    Pneumonia    Urinary incontinence     Past Surgical History:  Procedure Laterality Date   A/V FISTULAGRAM N/A 06/06/2019   Procedure: A/V FISTULAGRAM;  Surgeon: Marty Heck, MD;  Location: [place] [facility];  Service: Cardiovascular;  Laterality: N/A;   AV FISTULA PLACEMENT Left 04/15/2017   Procedure: ARTERIOVENOUS (AV) FISTULA CREATION LEFT ARM;  Surgeon: Serafina Mitchell, MD;  Location: [facility] OR;  Service: Vascular;  Laterality: Left;   AV FISTULA PLACEMENT Left 05/09/2017   Procedure: INSERTION OF ARTERIOVENOUS (AV) GORE-TEX GRAFT LEFT UPPER ARM;  Surgeon: [name], MD;  Location: [place];  Service: Vascular;  Laterality: Left;   AV FISTULA PLACEMENT Right 09/06/2018   Procedure: INSERTION OF ARTERIOVENOUS (AV) GORE-TEX GRAFT RIGHT  ARM;  Surgeon: [name], MD;  Location: [place];  Service: Vascular;  Laterality: Right;   AV FISTULA PLACEMENT Right 07/03/2020   Procedure: Removal of right upper arm Gortex graft;  Surgeon: Waynetta Sandy, MD;  Location: [place];  Service: Vascular;  Laterality: Right;   Toad Hop Right 08/09/2018   Procedure: BASILIC VEIN TRANSPOSITION 1st Stage;  Surgeon: [name], MD;  Location: [place];  Service: Vascular;  Laterality: Right;  BIOPSY  01/22/2017   Procedure: BIOPSY;  Surgeon: Danie Binder, MD;  [facility]: [facility];  Service: Endoscopy;;  gastric   COMBINED HYSTERECTOMY VAGINAL W/ MMK / A&P REPAIR  1981   ENDARTERECTOMY Left 10/06/2012   Procedure: Carotid Endarterectomy with Finesse patch angioplasty;  Surgeon: [name], MD;  Location: [facility] OR;  Service: Vascular;  Laterality: Left;   ESOPHAGOGASTRODUODENOSCOPY N/A 04/26/2014   Procedure:  ESOPHAGOGASTRODUODENOSCOPY (EGD);  Surgeon: Lear Ng, MD;  Location: [facility];  Service: Endoscopy;  Laterality: N/A;   ESOPHAGOGASTRODUODENOSCOPY N/A 01/22/2017   Procedure: ESOPHAGOGASTRODUODENOSCOPY (EGD);  Surgeon: Danie Binder, MD;  [facility];  Service: Endoscopy;  Laterality: N/A;   FLEXIBLE SIGMOIDOSCOPY N/A 01/22/2017   Procedure: FLEXIBLE SIGMOIDOSCOPY;  Surgeon: Danie Binder, MD;  [facility];  Service: Endoscopy;  Laterality: N/A;   INSERTION OF DIALYSIS CATHETER Right 07/03/2020   Procedure: INSERTION OF DIALYSIS CATHETER and exchange of right internal jugular dialysis catheter;  Surgeon: Waynetta Sandy, MD;  Location: [place];  Service: Vascular;  Laterality: Right;   IR CV LINE INJECTION  02/04/2017   IR CV LINE INJECTION  04/28/2018   IR CV LINE INJECTION  05/12/2018   IR CV LINE INJECTION  05/18/2018   IR FLUORO GUIDE CV LINE LEFT  01/14/2017   IR FLUORO GUIDE CV LINE LEFT  04/25/2017   IR FLUORO GUIDE CV LINE LEFT  05/13/2017   IR FLUORO GUIDE CV LINE LEFT  03/02/2018   IR FLUORO GUIDE CV LINE LEFT  04/28/2018   IR FLUORO GUIDE CV LINE LEFT  05/12/2018   IR FLUORO GUIDE CV LINE LEFT  05/18/2018   IR FLUORO GUIDE CV LINE LEFT  06/23/2018   IR FLUORO GUIDE CV LINE LEFT  08/02/2018   IR FLUORO GUIDE CV LINE RIGHT  08/22/2020   IR PTA VENOUS EXCEPT DIALYSIS CIRCUIT  04/28/2018   IR PTA VENOUS EXCEPT DIALYSIS CIRCUIT  05/12/2018   IR PTA VENOUS EXCEPT DIALYSIS CIRCUIT  08/22/2020   IR REMOVAL TUN CV CATH W/O FL  07/08/2017   IR REMOVAL TUN CV CATH W/O FL  10/11/2018   IR TRANSCATH RETRIEVAL FB INCL GUIDANCE (MS)  05/12/2018   IR US GUIDE VASC ACCESS LEFT  01/14/2017   LOOP RECORDER IMPLANT  04/16/13   MDT LinQ implanted for cryptogenic stroke   PERIPHERAL VASCULAR INTERVENTION Right 06/06/2019   Procedure: PERIPHERAL VASCULAR INTERVENTION;  Surgeon: Marty Heck, MD;  Location: [place] [facility];  Service: Cardiovascular;  Laterality:  Right;   [name] WITHOUT CARDIOVERSION N/A 04/16/2013   Procedure: TRANSESOPHAGEAL ECHOCARDIOGRAM ([name]);  Surgeon: Josue Hector, MD;  Location: [facility] ENDOSCOPY;  Service: Cardiovascular;  Laterality: N/A;   THROMBECTOMY AND REVISION OF ARTERIOVENTOUS (AV) GORETEX  GRAFT Left 07/24/2017   Procedure: THROMBECTOMY OF LEFT UPPER ARM ARTERIOVENTOUS (AV) GRAFT;  Surgeon: Conrad Fort Totten, MD;  Location: [place];  Service: Vascular;  Laterality: Left;   URETHRAL DILATION  1980's   [place]   "partial" (10/04/2012)     Inpatient Medications: Scheduled Meds:  atorvastatin  40 mg Oral QPM   calcium acetate  1,334 mg Oral TID WC   Chlorhexidine Gluconate Cloth  6 each Topical Q0600   clopidogrel  75 mg Oral Daily   dextromethorphan-guaiFENesin  1 tablet Oral BID   enoxaparin (LOVENOX) injection  30 mg Subcutaneous Q24H   famotidine  20 mg Oral Daily   feeding supplement (GLUCERNA SHAKE)  237  mL Oral TID BM   fluticasone  2 spray Each Nare Daily   insulin aspart  0-5 Units Subcutaneous QHS   insulin aspart  0-6 Units Subcutaneous TID WC   insulin glargine-yfgn  6 Units Subcutaneous Daily   ipratropium-albuterol  3 mL Nebulization TID   loratadine  10 mg Oral Daily   metoprolol succinate  100 mg Oral Daily   mometasone-formoterol  2 puff Inhalation BID   Continuous Infusions:  azithromycin     cefTRIAXone (ROCEPHIN)  IV 2 g (01/15/21 0954)   PRN Meds: ipratropium-albuterol, ondansetron (ZOFRAN) IV  Allergies:    Allergies  Allergen Reactions   Hydrochlorothiazide Nausea And Vomiting    Social History:   Social History   Tobacco Use   Smoking status: Former    Packs/day: 1.00    Years: 44.00    Pack years: 44.00    Types: Cigarettes    Quit date: 10/04/2019    Years since quitting: 1.2   Smokeless tobacco: Never  Substance Use Topics   Alcohol use: No    Alcohol/week: 0.0 standard drinks     Family History:   The patient's family history includes Diabetes in her  brother, father, mother, and paternal grandmother; Heart disease in her maternal grandfather and paternal grandfather; Hypertension in her maternal grandmother and sister; Kidney disease in her maternal grandmother; Seizures in her son.  ROS:  Please see the history of present illness.  Chronic neurological deficits with limited functional capacity. All other ROS reviewed and negative.     Physical Exam/Data:   Vitals:   01/15/21 0200 01/15/21 0426 01/15/21 0946 01/15/21 1016  BP: 131/63 (!) 169/75 (!) 170/72   Pulse: 80 81 81   Resp: (!) 22 18 20    Temp:  98.1 F (36.7 C)    TempSrc:      SpO2: 94% 95% 94% 94%  Weight:  72.7 kg    Height:  5\' 5"  (1.651 m)      Intake/Output Summary (Last 24 hours) at 01/15/2021 1027 Last data filed at 01/15/2021 0500 Gross per 24 hour  Intake 588.19 ml  Output --  Net 588.19 ml   Filed Weights   01/14/21 1916 01/15/21 0426  Weight: 90.7 kg 72.7 kg   Body mass index is 26.67 kg/m.   Gen: Chronically ill-appearing woman in no distress. HEENT: Conjunctiva and lids normal, oropharynx clear. Neck: Supple, increased girth, difficult to assess JVP, no obvious carotid bruits.  Temporary dialysis catheter right IJ. Lungs: Scattered rhonchi and expiratory wheezes anteriorly, nonlabored breathing at rest. Cardiac: Irregularly irregular, no S3, 1/6 systolic murmur, no pericardial rub. Abdomen: Soft, nontender, bowel sounds present. Extremities: Both upper extremity AV fistulas occluded.  No pitting edema, distal pulses 2+. Skin: Warm and dry. Musculoskeletal: No kyphosis. Neuropsychiatric: Alert and oriented x3, left-sided hemiparesis  EKG:  An ECG dated 01/14/2021 was personally reviewed today and demonstrated:  Atrial fibrillation with PVC, poor R wave progression and nonspecific ST changes.  Telemetry:  I personally reviewed telemetry which shows rate controlled atrial fibrillation.  Relevant CV Studies:  Lexiscan Myoview 09/12/2019: There  was no ST segment deviation noted during stress. T wave inversion was noted during stress in the V5 and V6 leads. T wave inversion persisted. The study is normal. This is a low risk study. Nuclear stress EF: 59%.   Low risk stress nuclear study with normal perfusion and normal left ventricular regional and global systolic function.  Echocardiogram 09/12/2019:  1. Left ventricular ejection fraction,  by estimation, is 60 to 65%. The  left ventricle has normal function. The left ventricle has no regional  wall motion abnormalities. There is moderate concentric left ventricular  hypertrophy. Left ventricular  diastolic parameters are consistent with Grade I diastolic dysfunction  (impaired relaxation).   2. Right ventricular systolic function is normal. The right ventricular  size is normal. There is normal pulmonary artery systolic pressure.   3. Left atrial size was severely dilated.   4. The mitral valve is normal in structure. No evidence of mitral valve  regurgitation. No evidence of mitral stenosis.   5. The aortic valve is tricuspid. Aortic valve regurgitation is not  visualized. No aortic stenosis is present.   6. The inferior vena cava is normal in size with greater than 50%  respiratory variability, suggesting right atrial pressure of 3 mmHg.   Laboratory Data:  Chemistry Recent Labs  Lab 01/14/21 1949 01/15/21 0404  NA 138 137  K 3.7 3.5  CL 100 101  CO2 21* 22  GLUCOSE 170* 128*  BUN 41* 42*  CREATININE 5.48* 5.70*  CALCIUM 7.9* 7.7*  GFRNONAA 8* 8*  ANIONGAP 17* 14    Recent Labs  Lab 01/14/21 1949 01/15/21 0404  PROT 6.7 6.3*  ALBUMIN 3.1* 2.9*  AST 22 15  ALT 21 19  ALKPHOS 83 77  BILITOT 0.4 0.7   Hematology Recent Labs  Lab 01/14/21 1949 01/15/21 0404  WBC 11.9* 11.2*  RBC 3.87 3.72*  HGB 11.2* 10.6*  HCT 36.5 35.4*  MCV 94.3 95.2  MCH 28.9 28.5  MCHC 30.7 29.9*  RDW 16.3* 16.2*  PLT 300 296    Radiology/Studies:  DG Chest Portable 1  View  Result Date: 01/14/2021 CLINICAL DATA:  Shortness of breath and chest congestion.  No fever. EXAM: PORTABLE CHEST 1 VIEW COMPARISON:  Portable chest 07/03/2020. FINDINGS: Right IJ dialysis catheter tip is in the upper right atrium. Left chest loop recorder device is again noted as well. A vascular stent in the right axilla is also redemonstrated. There is mild cardiomegaly with normal caliber central vasculature. There is patchy aortic calcific plaque. Small amount of opacity noted in the left costophrenic angle and could indicate atelectasis, small infiltrate or minimal pleural effusion. The remaining lungs are clear.  Generalized osteopenia. IMPRESSION: Small opacity left costophrenic angle area, which could be due to atelectasis, small pneumonia or minimal pleural effusion. Follow-up PA and lateral chest recommended. Electronically Signed   By: Telford Nab M.D.   On: 01/14/2021 20:06    Assessment and Plan:   1.  Persistent atrial fibrillation of uncertain duration, rate controlled.  ECG from May also showed atrial fibrillation so possibly present for months.  CHA2DS2-VASc score is 7.  Interestingly, she had prior history of cryptogenic stroke with ILR placed and followed by Dr. Lovena Le, never with clear-cut atrial fibrillation documented years back.  ILR is nonfunctional at this point and cannot be reinterrogated.  Echocardiogram last year showed severe left atrial enlargement.  2.  History of stroke with residual neurological defects and limited functional status.  3.  ESRD on hemodialysis.  Upper extremity AV fistulas nonfunctional and currently using a temporary dialysis catheter.  She dialyzes on Tuesdays, Thursdays, and Saturdays.  Nephrology is following.  4.  Community-acquired pneumonia.  Discussed with patient and husband.  Would recommend transitioning from Plavix to Eliquis 5 mg twice daily for stroke prophylaxis.  Follow-up echocardiogram to reevaluate cardiac structure and  function.  Heart rate is currently well  controlled on present dose of Toprol-XL.  Signed, Rozann Lesches, MD  01/15/2021 10:27 AM

## 2021-01-15 NOTE — Progress Notes (Signed)
Went in to assess patient right after down time.  [name] BS were wheezing and coarse.  Related my assessment to Dr. Josephine Cables.

## 2021-01-15 NOTE — Evaluation (Addendum)
Clinical/Bedside Swallow Evaluation Patient Details  Name: Kelli [name] MRN: [id number] Date of Birth: [birth year]  Today's Date: 01/15/2021 Time: SLP Start Time (ACUTE ONLY): 1700 SLP Stop Time (ACUTE ONLY): 6213 SLP Time Calculation (min) (ACUTE ONLY): 24 min  Past Medical History:  Past Medical History:  Diagnosis Date   Anemia    Anxiety    Arthritis    Carotid artery occlusion    Occluded RICA, status post left CEA  August 2014 - Dr. Donnetta Hutching   Cerebral infarction [facility]) 09/2012   Bihemispheric watershed infarcts   Cerebral infarction involving left cerebellar artery ([place]) 04/2013   Closed dislocation of left humerus 07/26/2013   Depression    DM (diabetes mellitus), type 2 ([place])    ESRD on hemodialysis ([place])    Essential hypertension    Fibromyalgia    Headache    History of kidney stones    Mixed hyperlipidemia    Multiple gastric ulcers    Pneumonia    Urinary incontinence    Past Surgical History:  Past Surgical History:  Procedure Laterality Date   A/V FISTULAGRAM N/A 06/06/2019   Procedure: A/V FISTULAGRAM;  Surgeon: Marty Heck, MD;  Location: [place] [facility];  Service: Cardiovascular;  Laterality: N/A;   AV FISTULA PLACEMENT Left 04/15/2017   Procedure: ARTERIOVENOUS (AV) FISTULA CREATION LEFT ARM;  Surgeon: Serafina Mitchell, MD;  Location: [place];  Service: Vascular;  Laterality: Left;   AV FISTULA PLACEMENT Left 05/09/2017   Procedure: INSERTION OF ARTERIOVENOUS (AV) GORE-TEX GRAFT LEFT UPPER ARM;  Surgeon: [name], MD;  Location: [place];  Service: Vascular;  Laterality: Left;   AV FISTULA PLACEMENT Right 09/06/2018   Procedure: INSERTION OF ARTERIOVENOUS (AV) GORE-TEX GRAFT RIGHT  ARM;  Surgeon: [name], MD;  Location: [place];  Service: Vascular;  Laterality: Right;   AV FISTULA PLACEMENT Right 07/03/2020   Procedure: Removal of right upper arm Gortex graft;  Surgeon: Waynetta Sandy, MD;  Location: [place];  Service: Vascular;  Laterality:  Right;   [place] Right 08/09/2018   Procedure: BASILIC VEIN TRANSPOSITION 1st Stage;  Surgeon: [name], MD;  Location: [place];  Service: Vascular;  Laterality: Right;   BIOPSY  01/22/2017   Procedure: BIOPSY;  Surgeon: Danie Binder, MD;  [facility];  Service: Endoscopy;;  gastric   COMBINED HYSTERECTOMY VAGINAL W/ MMK / A&P REPAIR  1981   ENDARTERECTOMY Left 10/06/2012   Procedure: Carotid Endarterectomy with Finesse patch angioplasty;  Surgeon: [name], MD;  Location: [facility];  Service: Vascular;  Laterality: Left;   ESOPHAGOGASTRODUODENOSCOPY N/A 04/26/2014   Procedure: ESOPHAGOGASTRODUODENOSCOPY (EGD);  Surgeon: Lear Ng, MD;  Location: [facility];  Service: Endoscopy;  Laterality: N/A;   ESOPHAGOGASTRODUODENOSCOPY N/A 01/22/2017   Procedure: ESOPHAGOGASTRODUODENOSCOPY (EGD);  Surgeon: Danie Binder, MD;  [facility];  Service: Endoscopy;  Laterality: N/A;   FLEXIBLE SIGMOIDOSCOPY N/A 01/22/2017   Procedure: FLEXIBLE SIGMOIDOSCOPY;  Surgeon: Danie Binder, MD;  [facility];  Service: Endoscopy;  Laterality: N/A;   INSERTION OF DIALYSIS CATHETER Right 07/03/2020   Procedure: INSERTION OF DIALYSIS CATHETER and exchange of right internal jugular dialysis catheter;  Surgeon: Waynetta Sandy, MD;  Location: [place];  Service: Vascular;  Laterality: Right;   IR CV LINE INJECTION  02/04/2017   IR CV LINE INJECTION  04/28/2018   IR CV LINE INJECTION  05/12/2018   IR CV LINE INJECTION  05/18/2018   IR FLUORO GUIDE CV LINE LEFT  01/14/2017   IR FLUORO GUIDE CV LINE LEFT  04/25/2017   IR FLUORO GUIDE CV LINE LEFT  05/13/2017   IR FLUORO GUIDE CV LINE LEFT  03/02/2018   IR FLUORO GUIDE CV LINE LEFT  04/28/2018   IR FLUORO GUIDE CV LINE LEFT  05/12/2018   IR FLUORO GUIDE CV LINE LEFT  05/18/2018   IR FLUORO GUIDE CV LINE LEFT  06/23/2018   IR FLUORO GUIDE CV LINE LEFT  08/02/2018   IR FLUORO GUIDE CV LINE RIGHT  08/22/2020    IR PTA VENOUS EXCEPT DIALYSIS CIRCUIT  04/28/2018   IR PTA VENOUS EXCEPT DIALYSIS CIRCUIT  05/12/2018   IR PTA VENOUS EXCEPT DIALYSIS CIRCUIT  08/22/2020   IR REMOVAL TUN CV CATH W/O FL  07/08/2017   IR REMOVAL TUN CV CATH W/O FL  10/11/2018   IR TRANSCATH RETRIEVAL FB INCL GUIDANCE (MS)  05/12/2018   IR US GUIDE VASC ACCESS LEFT  01/14/2017   LOOP RECORDER IMPLANT  04/16/13   MDT LinQ implanted for cryptogenic stroke   PERIPHERAL VASCULAR INTERVENTION Right 06/06/2019   Procedure: PERIPHERAL VASCULAR INTERVENTION;  Surgeon: Marty Heck, MD;  Location: [place] [facility];  Service: Cardiovascular;  Laterality: Right;   [name] WITHOUT CARDIOVERSION N/A 04/16/2013   Procedure: TRANSESOPHAGEAL ECHOCARDIOGRAM ([name]);  Surgeon: Josue Hector, MD;  Location: [facility] ENDOSCOPY;  Service: Cardiovascular;  Laterality: N/A;   THROMBECTOMY AND REVISION OF ARTERIOVENTOUS (AV) GORETEX  GRAFT Left 07/24/2017   Procedure: THROMBECTOMY OF LEFT UPPER ARM ARTERIOVENTOUS (AV) GRAFT;  Surgeon: Conrad Hurricane, MD;  Location: [place];  Service: Vascular;  Laterality: Left;   URETHRAL DILATION  1980's   VAGINAL HYSTERECTOMY  1981   "partial" (10/04/2012)   HPI:  Patient 70 year old female history of ESRD on HD Tuesday Thursday Saturday, history of cryptogenic stroke with left hemiplegia, bedbound, type 2 diabetes, chronic diastolic CHF, hypertension, hyperlipidemia, anxiety presented to the ED with 1 week of cough, sputum clear, congestion, shortness of breath seen by PCP trial of outpatient antibiotics however with no significant improvement patient presented to the ED.  COVID-19 PCR, influenza negative.  CBC noted a leukocytosis.  Patient noted to have sats of 91 to 96% on room air.  Chest x-ray done with small opacity noted in left costophrenic angle area could be due to atelectasis versus small pneumonia minimal pleural effusion.  Patient placed empirically on IV Rocephin and azithromycin to treat empirically for pneumonia.   Nephrology consulted for ongoing HD.  Patient noted to be in A. fib new onset no prior history and cardiology consulted. BSE requested   MBSS from August 2018 <<Pt presents with a mild to moderate oropharyngeal dysphagia of prior neurogenic etiology with sensory motor involvement. Consistent delay in swallow initiation exhibited across thin, nectar, and solid consistencies with reduced laryngeal closure resulting in penetration before and after the swallow. Pt cleared the penetrates from the laryngeal vestibule with a reflexive swallow and intermittent spontaneous throat clears. Chin tuck postural maneuver ineffective at preventing penetration. Aspiration evidenced with nectar thick liquid by teaspoon before the swallow which was not sensed. Pt displayed prolonged oral holding of nectar thick consistency and discoordination which was felt to negatively influence swallow safety. Mild vallecular residuals exhibited across PO consistencies secondary to reduced tongue base retraction with intemittent penetration after the swallow.  This was cleared from the laryngeal vestibule by additional reflexive swallows. Recommend thin liquids with regular consistencies and  medicines whole in puree with full supervision with all PO to ensure safe swallow precautions. Aspiration risk remains increased in setting of CVA hx, reduced mobility, and decreased respiratory status.>>   Assessment / Plan / Recommendation  Clinical Impression  Clinicial swallow evaluation completed at bedside with husband present in room. Pt and spouse report h/o dysphagia following her stroke in 2014, but was advanced to regular textures and thin liquids. She had a repeat MBSS in 2018 during an acute admission with deconditioning and a regular diet with thin liquids was recommended. Pt it at risk for aspiration given current deconditioning, previous stroke with left hemiparesis, and h/o dysphagia, however she appears knowledgeable about aspiration  risks/precautions. She was reminded to consume small bites/sips (prefers a straw to assist with control) and masticate solids well. Her husband does indicate occasional coughing/throat clearing with some solids. See above for last MBSS completed. SLP can complete MBSS this admission if MD desires, however Pt appears to be at baseline. Recommend regular textures and thin liquids with po medications whole with water when Pt is alert and upright, assist with tray set up.   SLP Visit Diagnosis: Dysphagia, unspecified (R13.10)    Aspiration Risk  Mild aspiration risk    Diet Recommendation Regular;Thin liquid   Liquid Administration via: Cup;Straw Medication Administration: Whole meds with liquid Supervision: Patient able to self feed;Intermittent supervision to cue for compensatory strategies Compensations: Slow rate;Small sips/bites Postural Changes: Seated upright at 90 degrees;Remain upright for at least 30 minutes after po intake    Other  Recommendations Oral Care Recommendations: Oral care BID Other Recommendations: Clarify dietary restrictions    Recommendations for follow up therapy are one component of a multi-disciplinary discharge planning process, led by the attending physician.  Recommendations may be updated based on patient status, additional functional criteria and insurance authorization.  Follow up Recommendations No SLP follow up      Assistance Recommended at Discharge Intermittent Supervision/Assistance  Functional Status Assessment    Frequency and Duration min 2x/week  1 week       Prognosis Prognosis for Safe Diet Advancement: Good      Swallow Study   General Date of Onset: 01/14/21 HPI: Patient 70 year old female history of ESRD on HD Tuesday Thursday Saturday, history of cryptogenic stroke with left hemiplegia, bedbound, type 2 diabetes, chronic diastolic CHF, hypertension, hyperlipidemia, anxiety presented to the ED with 1 week of cough, sputum clear,  congestion, shortness of breath seen by PCP trial of outpatient antibiotics however with no significant improvement patient presented to the ED.  COVID-19 PCR, influenza negative.  CBC noted a leukocytosis.  Patient noted to have sats of 91 to 96% on room air.  Chest x-ray done with small opacity noted in left costophrenic angle area could be due to atelectasis versus small pneumonia minimal pleural effusion.  Patient placed empirically on IV Rocephin and azithromycin to treat empirically for pneumonia.  Nephrology consulted for ongoing HD.  Patient noted to be in A. fib new onset no prior history and cardiology consulted. BSE requested Type of Study: Bedside Swallow Evaluation Previous Swallow Assessment: MBSS 2018 reg/thin Diet Prior to this Study: Regular;Thin liquids (renal) Temperature Spikes Noted: No Respiratory Status: Room air History of Recent Intubation: No Behavior/Cognition: Alert;Cooperative;Pleasant mood Oral Cavity Assessment: Within Functional Limits Oral Care Completed by SLP: Recent completion by staff Oral Cavity - Dentition: Adequate natural dentition Vision: Functional for self-feeding Self-Feeding Abilities: Able to feed self;Needs set up (uses right hand) Patient Positioning: Upright in bed  Baseline Vocal Quality: Normal Volitional Cough: Strong;Congested Volitional Swallow: Able to elicit    Oral/Motor/Sensory Function Overall Oral Motor/Sensory Function: Within functional limits   Ice Chips Ice chips: Within functional limits Presentation: Spoon   Thin Liquid Thin Liquid: Within functional limits Presentation: Cup;Self Fed;Straw    Nectar Thick Nectar Thick Liquid: Not tested   Honey Thick Honey Thick Liquid: Not tested   Puree Puree: Within functional limits Presentation: Spoon   Solid     Solid: Within functional limits Presentation: Self Fed     Thank you,  Genene Churn, [place]  Samira Acero 01/15/2021,5:38 PM

## 2021-01-15 NOTE — Discharge Instructions (Signed)

## 2021-01-15 NOTE — Consult Note (Addendum)
[facility] Nephrology Consult Note: Reason for Consult: To manage dialysis and dialysis related needs Referring Physician: Dr. Grandville Silos, Quillian Quince  HPI:  Kelli [name] is an 70 y.o. female with history of hypertension, type 2 diabetes, chronic diastolic CHF, stroke, HLD, anxiety, ESRD on HD TTS schedule at [facility] presents with a week of cough, shortness of breath and congestion, seen as a consultation for the management of ESRD.  The patient took Augmentin and doxycycline prescribed by her PCP without much help.  She presented to the ER with worsening symptoms.  In the ER she was hypertensive, oxygen saturation around 91 to 96% on room air.  The labs showed leukocytosis.  Influenza, COVID negative.  Chest x-ray showed possible atelectasis or small pneumonia on the left costophrenic angle. She was started on azithromycin and ceftriaxone and admitted for further evaluation. She is currently sleeping and somnolent.  I was able to woke her up but did not get much information from her.  Her husband is at bedside who said the patient sleeps that.  The review of system is limited.  Most of the information obtained from the chart and from her husband. As per husband the patient gets dialysis 4 hours, last treatment was on Tuesday, has RIJ TDC. Reportedly the AVF failed in both arms.  He does not know much detail about it.  OP HD orders: Dialyzes at   [facility], TTS, RIJ TDC.   Past Medical History:  Diagnosis Date   Anemia    Anginal pain (HCC)    Anxiety    Arthritis    Carotid artery occlusion    Occluded RICA, status post left CEA  August 2014 - Dr. 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   CHF (congestive heart failure) (HCC)    CKD (chronic kidney disease) stage 3, GFR 30-59 ml/min (HCC)    dialysis T/Th/sa   Closed dislocation of left humerus 07/26/2013   Depression     DM (diabetes mellitus), type 2 (HCC)    Dyspnea    with exertion   Dysrhythmia    Essential hypertension, benign    Fibromyalgia    Headache    History of kidney stones    Mixed hyperlipidemia    Multiple gastric ulcers    Pneumonia    Stroke [facility])    pt states she cannot walk, left arm weakness   Urinary incontinence     Past Surgical History:  Procedure Laterality Date   A/V FISTULAGRAM N/A 06/06/2019   Procedure: A/V FISTULAGRAM;  Surgeon: Marty Heck, MD;  Location: [place] [facility];  Service: Cardiovascular;  Laterality: N/A;   AV FISTULA PLACEMENT Left 04/15/2017   Procedure: ARTERIOVENOUS (AV) FISTULA CREATION LEFT ARM;  Surgeon: Serafina Mitchell, MD;  Location: [place];  Service: Vascular;  Laterality: Left;   AV FISTULA PLACEMENT Left 05/09/2017   Procedure: INSERTION OF ARTERIOVENOUS (AV) GORE-TEX GRAFT LEFT UPPER ARM;  Surgeon: [name], MD;  Location: [place];  Service: Vascular;  Laterality: Left;   AV FISTULA PLACEMENT Right 09/06/2018   Procedure: INSERTION OF ARTERIOVENOUS (AV) GORE-TEX GRAFT RIGHT  ARM;  Surgeon: [name], MD;  Location: [place];  Service: Vascular;  Laterality: Right;   AV FISTULA PLACEMENT Right 07/03/2020   Procedure: Removal of right upper arm Gortex graft;  Surgeon: Waynetta Sandy, MD;  Location: [place];  Service: Vascular;  Laterality: Right;   BASCILIC VEIN TRANSPOSITION Right 08/09/2018   Procedure: BASILIC VEIN TRANSPOSITION 1st Stage;  Surgeon: [name], MD;  Location: [place];  Service: Vascular;  Laterality: Right;   BIOPSY  01/22/2017   Procedure: BIOPSY;  Surgeon: Danie Binder, MD;  [facility];  Service: Endoscopy;;  gastric   COMBINED HYSTERECTOMY VAGINAL W/ MMK / A&P REPAIR  1981   ENDARTERECTOMY Left 10/06/2012   Procedure: Carotid Endarterectomy with Finesse patch angioplasty;  Surgeon: [name], MD;  Location: [facility] OR;  Service: Vascular;  Laterality: Left;   ESOPHAGOGASTRODUODENOSCOPY N/A  04/26/2014   Procedure: ESOPHAGOGASTRODUODENOSCOPY (EGD);  Surgeon: Lear Ng, MD;  Location: [place] [facility];  Service: Endoscopy;  Laterality: N/A;   ESOPHAGOGASTRODUODENOSCOPY N/A 01/22/2017   Procedure: ESOPHAGOGASTRODUODENOSCOPY (EGD);  Surgeon: Danie Binder, MD;  [facility];  Service: Endoscopy;  Laterality: N/A;   FLEXIBLE SIGMOIDOSCOPY N/A 01/22/2017   Procedure: FLEXIBLE SIGMOIDOSCOPY;  Surgeon: Danie Binder, MD;  [facility];  Service: Endoscopy;  Laterality: N/A;   INSERTION OF DIALYSIS CATHETER Right 07/03/2020   Procedure: INSERTION OF DIALYSIS CATHETER and exchange of right internal jugular dialysis catheter;  Surgeon: Waynetta Sandy, MD;  Location: [place];  Service: Vascular;  Laterality: Right;   IR CV LINE INJECTION  02/04/2017   IR CV LINE INJECTION  04/28/2018   IR CV LINE INJECTION  05/12/2018   IR CV LINE INJECTION  05/18/2018   IR FLUORO GUIDE CV LINE LEFT  01/14/2017   IR FLUORO GUIDE CV LINE LEFT  04/25/2017   IR FLUORO GUIDE CV LINE LEFT  05/13/2017   IR FLUORO GUIDE CV LINE LEFT  03/02/2018   IR FLUORO GUIDE CV LINE LEFT  04/28/2018   IR FLUORO GUIDE CV LINE LEFT  05/12/2018   IR FLUORO GUIDE CV LINE LEFT  05/18/2018   IR FLUORO GUIDE CV LINE LEFT  06/23/2018   IR FLUORO GUIDE CV LINE LEFT  08/02/2018   IR FLUORO GUIDE CV LINE RIGHT  08/22/2020   IR PTA VENOUS EXCEPT DIALYSIS CIRCUIT  04/28/2018   IR PTA VENOUS EXCEPT DIALYSIS CIRCUIT  05/12/2018   IR PTA VENOUS EXCEPT DIALYSIS CIRCUIT  08/22/2020   IR REMOVAL TUN CV CATH W/O FL  07/08/2017   IR REMOVAL TUN CV CATH W/O FL  10/11/2018   IR TRANSCATH RETRIEVAL FB INCL GUIDANCE (MS)  05/12/2018   IR US GUIDE VASC ACCESS LEFT  01/14/2017   LOOP RECORDER IMPLANT  04/16/13   MDT LinQ implanted for cryptogenic stroke   PERIPHERAL VASCULAR INTERVENTION Right 06/06/2019   Procedure: PERIPHERAL VASCULAR INTERVENTION;  Surgeon: Marty Heck, MD;  Location: [place] [facility];  Service:  Cardiovascular;  Laterality: Right;   [name] WITHOUT CARDIOVERSION N/A 04/16/2013   Procedure: TRANSESOPHAGEAL ECHOCARDIOGRAM ([name]);  Surgeon: Josue Hector, MD;  Location: [place];  Service: Cardiovascular;  Laterality: N/A;   THROMBECTOMY AND REVISION OF ARTERIOVENTOUS (AV) GORETEX  GRAFT Left 07/24/2017   Procedure: THROMBECTOMY OF LEFT UPPER ARM ARTERIOVENTOUS (AV) GRAFT;  Surgeon: Conrad Herricks, MD;  Location: [place];  Service: Vascular;  Laterality: Left;   URETHRAL DILATION  1980's   VAGINAL HYSTERECTOMY  1981   "partial" (10/04/2012)    Family History  Problem Relation Age of Onset   Diabetes Mother    Diabetes Father    Hypertension Sister    Diabetes Brother    Seizures Son    Kidney  disease Maternal Grandmother    Hypertension Maternal Grandmother    Heart disease Maternal Grandfather    Diabetes Paternal Grandmother    Heart disease Paternal Grandfather     Social History:  reports that she quit smoking about 15 months ago. Her smoking use included cigarettes. She has a 44.00 pack-year smoking history. She has never used smokeless tobacco. She reports that she does not drink alcohol and does not use drugs.  Allergies:  Allergies  Allergen Reactions   Hydrochlorothiazide Nausea And Vomiting    Medications: I have reviewed the patient's current medications.   Results for orders placed or performed during the hospital encounter of 01/14/21 (from the past 48 hour(s))  Resp Panel by RT-PCR (Flu A&B, Covid) Nasopharyngeal Swab     Status: None   Collection Time: 01/14/21  7:35 PM   Specimen: Nasopharyngeal Swab; Nasopharyngeal(NP) swabs in vial transport medium  Result Value Ref Range   SARS Coronavirus 2 by RT PCR NEGATIVE NEGATIVE    Comment: (NOTE) SARS-CoV-2 target nucleic acids are NOT DETECTED.  The SARS-CoV-2 RNA is generally detectable in upper respiratory specimens during the acute phase of infection. The lowest concentration of SARS-CoV-2 viral copies this  assay can detect is 138 copies/mL. A negative result does not preclude SARS-Cov-2 infection and should not be used as the sole basis for treatment or other patient management decisions. A negative result may occur with  improper specimen collection/handling, submission of specimen other than nasopharyngeal swab, presence of viral mutation(s) within the areas targeted by this assay, and inadequate number of viral copies(<138 copies/mL). A negative result must be combined with clinical observations, patient history, and epidemiological information. The expected result is Negative.  Fact Sheet for Patients:  EntrepreneurPulse.com.au  Fact Sheet for Healthcare Providers:  IncredibleEmployment.be  This test is no t yet approved or cleared by the [place] FDA and  has been authorized for detection and/or diagnosis of SARS-CoV-2 by FDA under an Emergency Use Authorization (EUA). This EUA will remain  in effect (meaning this test can be used) for the duration of the COVID-19 declaration under Section 564(b)(1) of the Act, 21 U.S.C.section 360bbb-3(b)(1), unless the authorization is terminated  or revoked sooner.       Influenza A by PCR NEGATIVE NEGATIVE   Influenza B by PCR NEGATIVE NEGATIVE    Comment: (NOTE) The Xpert Xpress SARS-CoV-2/FLU/RSV plus assay is intended as an aid in the diagnosis of influenza from Nasopharyngeal swab specimens and should not be used as a sole basis for treatment. Nasal washings and aspirates are unacceptable for Xpert Xpress SARS-CoV-2/FLU/RSV testing.  Fact Sheet for Patients: EntrepreneurPulse.com.au  Fact Sheet for Healthcare Providers: IncredibleEmployment.be  This test is not yet approved or cleared by the [place] FDA and has been authorized for detection and/or diagnosis of SARS-CoV-2 by FDA under an Emergency Use Authorization (EUA). This EUA will remain in  effect (meaning this test can be used) for the duration of the COVID-19 declaration under Section 564(b)(1) of the Act, 21 U.S.C. section 360bbb-3(b)(1), unless the authorization is terminated or revoked.  Performed at [facility], [address] [place], [place] [address]   Comprehensive metabolic panel     Status: Abnormal   Collection Time: 01/14/21  7:49 PM  Result Value Ref Range   Sodium 138 135 - 145 mmol/L   Potassium 3.7 3.5 - 5.1 mmol/L   Chloride 100 98 - 111 mmol/L   CO2 21 (L) 22 - 32 mmol/L   Glucose, Bld  170 (H) 70 - 99 mg/dL    Comment: Glucose reference range applies only to samples taken after fasting for at least 8 hours.   BUN 41 (H) 8 - 23 mg/dL   Creatinine, Ser 5.48 (H) 0.44 - 1.00 mg/dL   Calcium 7.9 (L) 8.9 - 10.3 mg/dL   Total Protein 6.7 6.5 - 8.1 g/dL   Albumin 3.1 (L) 3.5 - 5.0 g/dL   AST 22 15 - 41 U/L   ALT 21 0 - 44 U/L   Alkaline Phosphatase 83 38 - 126 U/L   Total Bilirubin 0.4 0.3 - 1.2 mg/dL   GFR, Estimated 8 (L) >60 mL/min    Comment: (NOTE) Calculated using the CKD-EPI Creatinine Equation (2021)    Anion gap 17 (H) 5 - 15    Comment: Performed at [facility], [address] [place], [place] [address]  CBC with Differential     Status: Abnormal   Collection Time: 01/14/21  7:49 PM  Result Value Ref Range   WBC 11.9 (H) 4.0 - 10.5 K/uL   RBC 3.87 3.87 - 5.11 MIL/uL   Hemoglobin 11.2 (L) 12.0 - 15.0 g/dL   HCT 36.5 36.0 - 46.0 %   MCV 94.3 80.0 - 100.0 fL   MCH 28.9 26.0 - 34.0 pg   MCHC 30.7 30.0 - 36.0 g/dL   RDW 16.3 (H) 11.5 - 15.5 %   Platelets 300 150 - 400 K/uL   nRBC 0.0 0.0 - 0.2 %   Neutrophils Relative % 67 %   Neutro Abs 8.0 (H) 1.7 - 7.7 K/uL   Lymphocytes Relative 18 %   Lymphs Abs 2.2 0.7 - 4.0 K/uL   Monocytes Relative 12 %   Monocytes Absolute 1.4 (H) 0.1 - 1.0 K/uL   Eosinophils Relative 1 %   Eosinophils Absolute 0.1 0.0 - 0.5 K/uL   Basophils Relative 1 %   Basophils Absolute 0.1 0.0 - 0.1 K/uL   Immature  Granulocytes 1 %   Abs Immature Granulocytes 0.07 0.00 - 0.07 K/uL    Comment: Performed at [facility], [address] [place], [place] [address]  Comprehensive metabolic panel     Status: Abnormal   Collection Time: 01/15/21  4:04 AM  Result Value Ref Range   Sodium 137 135 - 145 mmol/L   Potassium 3.5 3.5 - 5.1 mmol/L   Chloride 101 98 - 111 mmol/L   CO2 22 22 - 32 mmol/L   Glucose, Bld 128 (H) 70 - 99 mg/dL    Comment: Glucose reference range applies only to samples taken after fasting for at least 8 hours.   BUN 42 (H) 8 - 23 mg/dL   Creatinine, Ser 5.70 (H) 0.44 - 1.00 mg/dL   Calcium 7.7 (L) 8.9 - 10.3 mg/dL   Total Protein 6.3 (L) 6.5 - 8.1 g/dL   Albumin 2.9 (L) 3.5 - 5.0 g/dL   AST 15 15 - 41 U/L   ALT 19 0 - 44 U/L   Alkaline Phosphatase 77 38 - 126 U/L   Total Bilirubin 0.7 0.3 - 1.2 mg/dL   GFR, Estimated 8 (L) >60 mL/min    Comment: (NOTE) Calculated using the CKD-EPI Creatinine Equation (2021)    Anion gap 14 5 - 15    Comment: Performed at [facility], [address] [place], [place] [address]  CBC     Status: Abnormal   Collection Time: 01/15/21  4:04 AM  Result Value Ref Range   WBC 11.2 (  H) 4.0 - 10.5 K/uL   RBC 3.72 (L) 3.87 - 5.11 MIL/uL   Hemoglobin 10.6 (L) 12.0 - 15.0 g/dL   HCT 35.4 (L) 36.0 - 46.0 %   MCV 95.2 80.0 - 100.0 fL   MCH 28.5 26.0 - 34.0 pg   MCHC 29.9 (L) 30.0 - 36.0 g/dL   RDW 16.2 (H) 11.5 - 15.5 %   Platelets 296 150 - 400 K/uL   nRBC 0.0 0.0 - 0.2 %    Comment: Performed at [facility], [address] [place], [place] [address]  Protime-INR     Status: None   Collection Time: 01/15/21  4:04 AM  Result Value Ref Range   Prothrombin Time 14.8 11.4 - 15.2 seconds   INR 1.2 0.8 - 1.2    Comment: (NOTE) INR goal varies based on device and disease states. Performed at [facility], [address], [place], [place] [address]   APTT     Status: None   Collection Time: 01/15/21  4:04 AM  Result Value Ref Range   aPTT 31 24 - 36  seconds    Comment: Performed at [facility], [address], [place], [place] [address]  Magnesium     Status: None   Collection Time: 01/15/21  4:04 AM  Result Value Ref Range   Magnesium 1.8 1.7 - 2.4 mg/dL    Comment: Performed at [facility], [address], [place], [place] [address]  Phosphorus     Status: Abnormal   Collection Time: 01/15/21  4:04 AM  Result Value Ref Range   Phosphorus 7.9 (H) 2.5 - 4.6 mg/dL    Comment: Performed at [facility], [address], [place], [place] [address]  Procalcitonin - Baseline     Status: None   Collection Time: 01/15/21  4:04 AM  Result Value Ref Range   Procalcitonin 2.08 ng/mL    Comment:        Interpretation: PCT > 2 ng/mL: Systemic infection (sepsis) is likely, unless other causes are known. (NOTE)       Sepsis PCT Algorithm           Lower Respiratory Tract                                      Infection PCT Algorithm    ----------------------------     ----------------------------         PCT < 0.25 ng/mL                PCT < 0.10 ng/mL          Strongly encourage             Strongly discourage   discontinuation of antibiotics    initiation of antibiotics    ----------------------------     -----------------------------       PCT 0.25 - 0.50 ng/mL            PCT 0.10 - 0.25 ng/mL               OR       >80% decrease in PCT            Discourage initiation of  antibiotics      Encourage discontinuation           of antibiotics    ----------------------------     -----------------------------         PCT >= 0.50 ng/mL              PCT 0.26 - 0.50 ng/mL               AND       <80% decrease in PCT              Encourage initiation of                                             antibiotics       Encourage continuation           of antibiotics    ----------------------------     -----------------------------        PCT >= 0.50 ng/mL                  PCT > 0.50 ng/mL                AND         increase in PCT                  Strongly encourage                                      initiation of antibiotics    Strongly encourage escalation           of antibiotics                                     -----------------------------                                           PCT <= 0.25 ng/mL                                                 OR                                        > 80% decrease in PCT                                      Discontinue / Do not initiate                                             antibiotics  Performed at [facility], [address] [place], [place] [address]   Hemoglobin A1c     Status: None   Collection Time: 01/15/21  4:04 AM  Result Value Ref Range   Hgb A1c MFr Bld 5.0 4.8 - 5.6 %    Comment: (NOTE) Pre diabetes:          5.7%-6.4%  Diabetes:              >6.4%  Glycemic control for   <7.0% adults with diabetes    Mean Plasma Glucose 96.8 mg/dL    Comment: Performed at [place] [address] [place], [place] [address]  Glucose, capillary     Status: Abnormal   Collection Time: 01/15/21  7:32 AM  Result Value Ref Range   Glucose-Capillary 116 (H) 70 - 99 mg/dL    Comment: Glucose reference range applies only to samples taken after fasting for at least 8 hours.    DG Chest Portable 1 View  Result Date: 01/14/2021 CLINICAL DATA:  Shortness of breath and chest congestion.  No fever. EXAM: PORTABLE CHEST 1 VIEW COMPARISON:  Portable chest 07/03/2020. FINDINGS: Right IJ dialysis catheter tip is in the upper right atrium. Left chest loop recorder device is again noted as well. A vascular stent in the right axilla is also redemonstrated. There is mild cardiomegaly with normal caliber central vasculature. There is patchy aortic calcific plaque. Small amount of opacity noted in the left costophrenic angle and could indicate atelectasis, small infiltrate or minimal pleural effusion. The remaining lungs are clear.   Generalized osteopenia. IMPRESSION: Small opacity left costophrenic angle area, which could be due to atelectasis, small pneumonia or minimal pleural effusion. Follow-up PA and lateral chest recommended. Electronically Signed   By: Telford Nab M.D.   On: 01/14/2021 20:06    ROS: As H&P.  Review of system limited because patient is somnolent. Blood pressure (!) 169/75, pulse 81, temperature 98.1 F (36.7 C), resp. rate 18, height 5\' 5"  (1.651 m), weight 72.7 kg, SpO2 95 %. Gen: NAD, comfortable Respiratory: Clear bilateral, no wheezing or crackle Cardiovascular: Regular rate rhythm S1-S2 normal, no rubs GI: Abdomen soft, nontender, nondistended Extremities, no cyanosis or clubbing, no edema Skin: No rash or ulcer Neurology: Alert, awake, following commands, oriented Dialysis Access:  Assessment/Plan:  #Community-acquired pneumonia: Currently on ceftriaxone azithromycin.  In room air.  No increased work of breathing.  # ESRD, TTS at [facility]: The volume status looks acceptable.  Plan for dialysis today by using right IJ TDC.  We will try to obtain outpatient treatment record.  Discussed with the dialysis nurse.  # Hypertension: Currently on metoprolol.  Monitor BP after dialysis.  UF as tolerated.  # Anemia of ESRD: Hemoglobin at goal.  No need for ESA today.  # Metabolic Bone Disease: Low calcium and hyperphosphatemia noted.  We will obtain the outpatient medications.  I will start PhosLo to take with each meal.  #History of stroke, chronic diastolic CHF  Kaari Zeigler Tanna Furry 01/15/2021, 9:20 AM

## 2021-01-15 NOTE — H&P (Signed)
History and Physical  Kelli [name]:[id number] DOB: [birth year] DOA: 01/14/2021  Referring physician: Davonna Belling, MD PCP: Manon Hilding, MD  Patient coming from: Home  Chief Complaint: Shortness of breath  HPI: Kelli [name] is a 70 y.o. female with medical history significant for ESRD on HD (TTS), prior cryptogenic stroke, type II DM, chronic diastolic CHF (ECHO 04/8364 EF 60-65% with G1 DD), essential hypertension, hyperlipidemia and anxiety who presents to the emergency department due to 1 week onset of cough with production of clear sputum, congestion and shortness of breath, she went to her PCP and was prescribed with Augmentin, doxycycline and cough medicine, outpatients chest x-ray was not done, she was asked to follow-up with her PCP in several days if she continues to feel better and to go to ER if her symptoms worsen.  Patient presents to the ED due to worsening fatigue and subjective fever.  ED Course:  In the emergency department, she was intermittently tachypneic, BP was 176/69, O2 sat was 91-96% on room air.  Work-up in the ED showed normal CBC except for leukocytosis, BMP showed hyperglycemia and BUN/creatinine 41/5.48 with an estimated GFR of 8.  Influenza A, B, SARS coronavirus 2 was negative. Chest x-ray showed small opacity left costophrenic angle area, which could be due to atelectasis, small pneumonia or minimal pleural effusion Patient was started on IV ceftriaxone and azithromycin.  Hospitalist was asked to admit patient for further evaluation and management.  Review of Systems: Constitutional: Negative for chills and fever.  HENT: Positive for congestion.  Negative for ear pain and sore throat.   Eyes: Negative for pain and visual disturbance.  Respiratory: Positive for cough and shortness of breath.   Cardiovascular: Negative for chest pain and palpitations.  Gastrointestinal: Negative for abdominal pain and vomiting.  Endocrine: Negative for polyphagia and  polyuria.  Genitourinary: Negative for decreased urine volume, dysuria, enuresis Musculoskeletal: Negative for arthralgias and back pain.  Skin: Negative for color change and rash.  Allergic/Immunologic: Negative for immunocompromised state.  Neurological: Positive for weakness.  Negative for tremors, syncope, speech difficulty and headaches.  Hematological: Does not bruise/bleed easily.  All other systems reviewed and are negative   Past Medical History:  Diagnosis Date   Anemia    Anginal pain (HCC)    Anxiety    Arthritis    Carotid artery occlusion    Occluded RICA, status post left CEA  August 2014 - Dr. Donnetta Hutching   Cerebral infarction [facility]) Aug 2014   Bihemispheric watershed infarcts   Cerebral infarction involving left cerebellar artery [facility]) Feb 2015   CHF (congestive heart failure) (HCC)    CKD (chronic kidney disease) stage 3, GFR 30-59 ml/min (HCC)    dialysis T/Th/sa   Closed dislocation of left humerus 07/26/2013   Depression    DM (diabetes mellitus), type 2 (HCC)    Dyspnea    with exertion   Dysrhythmia    Essential hypertension, benign    Fibromyalgia    Headache    History of kidney stones    Mixed hyperlipidemia    Multiple gastric ulcers    Pneumonia    Stroke [facility])    pt states she cannot walk, left arm weakness   Urinary incontinence    Past Surgical History:  Procedure Laterality Date   A/V FISTULAGRAM N/A 06/06/2019   Procedure: A/V FISTULAGRAM;  Surgeon: Marty Heck, MD;  Location: [place] CV [facility];  Service: Cardiovascular;  Laterality: N/A;   AV FISTULA  PLACEMENT Left 04/15/2017   Procedure: ARTERIOVENOUS (AV) FISTULA CREATION LEFT ARM;  Surgeon: Serafina Mitchell, MD;  Location: [facility] OR;  Service: Vascular;  Laterality: Left;   AV FISTULA PLACEMENT Left 05/09/2017   Procedure: INSERTION OF ARTERIOVENOUS (AV) GORE-TEX GRAFT LEFT UPPER ARM;  Surgeon: [name], MD;  Location: [place];  Service: Vascular;  Laterality: Left;   AV FISTULA  PLACEMENT Right 09/06/2018   Procedure: INSERTION OF ARTERIOVENOUS (AV) GORE-TEX GRAFT RIGHT  ARM;  Surgeon: [name], MD;  Location: [place];  Service: Vascular;  Laterality: Right;   AV FISTULA PLACEMENT Right 07/03/2020   Procedure: Removal of right upper arm Gortex graft;  Surgeon: Waynetta Sandy, MD;  Location: [place];  Service: Vascular;  Laterality: Right;   [place] Right 08/09/2018   Procedure: BASILIC VEIN TRANSPOSITION 1st Stage;  Surgeon: [name], MD;  Location: [place];  Service: Vascular;  Laterality: Right;   BIOPSY  01/22/2017   Procedure: BIOPSY;  Surgeon: Danie Binder, MD;  [facility];  Service: Endoscopy;;  gastric   COMBINED HYSTERECTOMY VAGINAL W/ MMK / A&P REPAIR  1981   ENDARTERECTOMY Left 10/06/2012   Procedure: Carotid Endarterectomy with Finesse patch angioplasty;  Surgeon: [name], MD;  Location: [facility] OR;  Service: Vascular;  Laterality: Left;   ESOPHAGOGASTRODUODENOSCOPY N/A 04/26/2014   Procedure: ESOPHAGOGASTRODUODENOSCOPY (EGD);  Surgeon: Lear Ng, MD;  Location: [facility] ENDOSCOPY;  Service: Endoscopy;  Laterality: N/A;   ESOPHAGOGASTRODUODENOSCOPY N/A 01/22/2017   Procedure: ESOPHAGOGASTRODUODENOSCOPY (EGD);  Surgeon: Danie Binder, MD;  [facility];  Service: Endoscopy;  Laterality: N/A;   FLEXIBLE SIGMOIDOSCOPY N/A 01/22/2017   Procedure: FLEXIBLE SIGMOIDOSCOPY;  Surgeon: Danie Binder, MD;  [facility];  Service: Endoscopy;  Laterality: N/A;   INSERTION OF DIALYSIS CATHETER Right 07/03/2020   Procedure: INSERTION OF DIALYSIS CATHETER and exchange of right internal jugular dialysis catheter;  Surgeon: Waynetta Sandy, MD;  Location: [place];  Service: Vascular;  Laterality: Right;   IR CV LINE INJECTION  02/04/2017   IR CV LINE INJECTION  04/28/2018   IR CV LINE INJECTION  05/12/2018   IR CV LINE INJECTION  05/18/2018   IR FLUORO GUIDE CV LINE LEFT  01/14/2017   IR FLUORO GUIDE  CV LINE LEFT  04/25/2017   IR FLUORO GUIDE CV LINE LEFT  05/13/2017   IR FLUORO GUIDE CV LINE LEFT  03/02/2018   IR FLUORO GUIDE CV LINE LEFT  04/28/2018   IR FLUORO GUIDE CV LINE LEFT  05/12/2018   IR FLUORO GUIDE CV LINE LEFT  05/18/2018   IR FLUORO GUIDE CV LINE LEFT  06/23/2018   IR FLUORO GUIDE CV LINE LEFT  08/02/2018   IR FLUORO GUIDE CV LINE RIGHT  08/22/2020   IR PTA VENOUS EXCEPT DIALYSIS CIRCUIT  04/28/2018   IR PTA VENOUS EXCEPT DIALYSIS CIRCUIT  05/12/2018   IR PTA VENOUS EXCEPT DIALYSIS CIRCUIT  08/22/2020   IR REMOVAL TUN CV CATH W/O FL  07/08/2017   IR REMOVAL TUN CV CATH W/O FL  10/11/2018   IR TRANSCATH RETRIEVAL FB INCL GUIDANCE (MS)  05/12/2018   IR US GUIDE VASC ACCESS LEFT  01/14/2017   LOOP RECORDER IMPLANT  04/16/13   MDT LinQ implanted for cryptogenic stroke   PERIPHERAL VASCULAR INTERVENTION Right 06/06/2019   Procedure: PERIPHERAL VASCULAR INTERVENTION;  Surgeon: Marty Heck, MD;  Location: [place] [facility];  Service: Cardiovascular;  Laterality: Right;   [name] WITHOUT CARDIOVERSION N/A 04/16/2013   Procedure: TRANSESOPHAGEAL ECHOCARDIOGRAM ([name]);  Surgeon: Josue Hector, MD;  Location: [place];  Service: Cardiovascular;  Laterality: N/A;   THROMBECTOMY AND REVISION OF ARTERIOVENTOUS (AV) GORETEX  GRAFT Left 07/24/2017   Procedure: THROMBECTOMY OF LEFT UPPER ARM ARTERIOVENTOUS (AV) GRAFT;  Surgeon: Conrad Six Mile, MD;  Location: [place];  Service: Vascular;  Laterality: Left;   URETHRAL DILATION  1980's   [place]   "partial" (10/04/2012)    Social History:  reports that she quit smoking about 15 months ago. Her smoking use included cigarettes. She has a 44.00 pack-year smoking history. She has never used smokeless tobacco. She reports that she does not drink alcohol and does not use drugs.   Allergies  Allergen Reactions   Hydrochlorothiazide Nausea And Vomiting    Family History  Problem Relation Age of Onset   Diabetes Mother    Diabetes  Father    Hypertension Sister    Diabetes Brother    Seizures Son    Kidney disease Maternal Grandmother    Hypertension Maternal Grandmother    Heart disease Maternal Grandfather    Diabetes Paternal Grandmother    Heart disease Paternal Grandfather      Prior to Admission medications   Medication Sig Start Date End Date Taking? Authorizing Provider  acetaminophen (TYLENOL) 325 MG tablet Take 2 tablets (650 mg total) by mouth every 6 (six) hours as needed for mild pain (or Fever >/= 101). 07/05/20  Yes Elgergawy, Silver Huguenin, MD  amoxicillin-clavulanate (AUGMENTIN) 500-125 MG tablet Take 1 tablet by mouth 2 (two) times daily. 01/06/21  Yes [provider]  atorvastatin (LIPITOR) 40 MG tablet Take 40 mg by mouth every evening.  05/17/18  Yes [provider]  benzonatate (TESSALON) 100 MG capsule Take 100 mg by mouth 3 (three) times daily as needed. 01/06/21  Yes [provider]  bismuth subsalicylate (PEPTO BISMOL) 262 MG/15ML suspension Take 30 mLs by mouth every 6 (six) hours as needed for indigestion.   Yes [provider]  clopidogrel (PLAVIX) 75 MG tablet Take 1 tablet (75 mg total) by mouth daily. Patient taking differently: Take 75 mg by mouth every evening. 01/31/17  Yes Kathie Dike, MD  doxycycline (VIBRA-TABS) 100 MG tablet Take 100 mg by mouth 2 (two) times daily. 01/12/21  Yes [provider]  hydrALAZINE (APRESOLINE) 50 MG tablet Take 1 tablet (50 mg total) by mouth 3 (three) times daily. Patient taking differently: Take 50 mg by mouth 2 (two) times daily. 10/20/16  Yes Cherene Altes, MD  insulin glargine (LANTUS) 100 UNIT/ML injection Inject 15 Units into the skin daily.   Yes [provider]  loperamide (IMODIUM) 2 MG capsule Take 2 mg by mouth as needed for diarrhea or loose stools.   Yes [provider]  metoprolol succinate (TOPROL-XL) 100 MG 24 hr tablet Take 1 tablet (100 mg total) by mouth daily. Patient  taking differently: Take 100 mg by mouth every evening. 07/24/17  Yes Conrad Samburg, MD  torsemide (DEMADEX) 20 MG tablet Take 40 mg by mouth daily. 08/27/19  Yes [provider]  venlafaxine XR (EFFEXOR-XR) 150 MG 24 hr capsule Take 150 mg by mouth daily.    Yes [provider]  amLODipine (NORVASC) 10 MG tablet Take 10 mg by mouth every evening.  Patient not taking: Reported on 01/14/2021    [provider]  azithromycin (ZITHROMAX) 250 MG tablet  TAKE 2 TABLETS BY MOUTH ON DAY 1, THEN TAKE 1 TABLET DAILY ON DAYS 2-5 Patient not taking: No sig reported 01/01/21   [provider]  clonazePAM (KLONOPIN) 0.5 MG tablet Take 0.5 tablets (0.25 mg total) by mouth daily. Patient not taking: No sig reported 07/05/20   Elgergawy, Silver Huguenin, MD  Insulin Glargine (BASAGLAR KWIKPEN) 100 UNIT/ML SOPN Inject 15 Units into the skin daily. Patient not taking: Reported on 01/14/2021    [provider]  methocarbamol (ROBAXIN) 500 MG tablet Take 1 tablet (500 mg total) by mouth every 6 (six) hours as needed for muscle spasms. Patient not taking: Reported on 01/14/2021 09/13/19   Shelly Coss, MD    Physical Exam: BP 131/63   Pulse 80   Temp 98 F (36.7 C) (Oral)   Resp (!) 22   Ht 5\' 5"  (1.651 m)   Wt 90.7 kg   SpO2 94%   BMI 33.28 kg/m   General: 70 y.o. year-old female well developed well nourished in no acute distress.  Alert and oriented x3. HEENT: NCAT, EOMI Neck: Supple, trachea medial Cardiovascular: Regular rate and rhythm with no rubs or gallops.  No thyromegaly or JVD noted.  No lower extremity edema. 2/4 pulses in all 4 extremities. Respiratory: Diffuse coarse breath sounds on auscultation.  No rales.   Abdomen: Soft, nontender nondistended with normal bowel sounds x4 quadrants. Muskuloskeletal: No cyanosis, clubbing or edema noted bilaterally Neuro: CN II-XII intact, left-sided weakness due to prior stroke, sensation, reflexes intact Skin: No  ulcerative lesions noted or rashes Psychiatry: Mood is appropriate for condition and setting          Labs on Admission:  Basic Metabolic Panel: Recent Labs  Lab 01/14/21 1949  NA 138  K 3.7  CL 100  CO2 21*  GLUCOSE 170*  BUN 41*  CREATININE 5.48*  CALCIUM 7.9*   Liver Function Tests: Recent Labs  Lab 01/14/21 1949  AST 22  ALT 21  ALKPHOS 83  BILITOT 0.4  PROT 6.7  ALBUMIN 3.1*   No results for input(s): LIPASE, AMYLASE in the last 168 hours. No results for input(s): AMMONIA in the last 168 hours. CBC: Recent Labs  Lab 01/14/21 1949  WBC 11.9*  NEUTROABS 8.0*  HGB 11.2*  HCT 36.5  MCV 94.3  PLT 300   Cardiac Enzymes: No results for input(s): CKTOTAL, CKMB, CKMBINDEX, TROPONINI in the last 168 hours.  BNP (last 3 results) No results for input(s): BNP in the last 8760 hours.  ProBNP (last 3 results) No results for input(s): PROBNP in the last 8760 hours.  CBG: No results for input(s): GLUCAP in the last 168 hours.  Radiological Exams on Admission: DG Chest Portable 1 View  Result Date: 01/14/2021 CLINICAL DATA:  Shortness of breath and chest congestion.  No fever. EXAM: PORTABLE CHEST 1 VIEW COMPARISON:  Portable chest 07/03/2020. FINDINGS: Right IJ dialysis catheter tip is in the upper right atrium. Left chest loop recorder device is again noted as well. A vascular stent in the right axilla is also redemonstrated. There is mild cardiomegaly with normal caliber central vasculature. There is patchy aortic calcific plaque. Small amount of opacity noted in the left costophrenic angle and could indicate atelectasis, small infiltrate or minimal pleural effusion. The remaining lungs are clear.  Generalized osteopenia. IMPRESSION: Small opacity left costophrenic angle area, which could be due to atelectasis, small pneumonia or minimal pleural effusion. Follow-up PA and lateral chest recommended. Electronically Signed   By: Lanny Hurst  Chesser M.D.   On: 01/14/2021 20:06     EKG: I independently viewed the EKG done and my findings are as followed: A. fib with rate control with VPCs  Assessment/Plan Present on Admission:  CAP (community acquired pneumonia)  Essential hypertension  Principal Problem:   CAP (community acquired pneumonia) Active Problems:   History of stroke   Chronic diastolic CHF (congestive heart failure) (HCC)   ESRD (end stage renal disease) on dialysis ([place])   Essential hypertension   Hypoalbuminemia due to protein-calorie malnutrition ([place])   Mixed hyperlipidemia  Community acquired pneumonia POA Chest x-ray was suggestive of atelectasis/pneumonia Patient was started on IV ceftriaxone azithromycin, we shall continue same at this time with plan to de-escalate/discontinue based on blood culture, urine culture, urine Legionella, strep pneumo, procalcitonin Continue Mucinex, incentive spirometry, flutter valve  Hypoalbuminemia secondary to mild protein calorie malnutrition Albumin 3.1, protein supplement to be provided  Hyperglycemia secondary to type 2 diabetes mellitus Continue ISS and hypoglycemia protocol Continue Lantus 6 units daily and adjust dose back to home dose of 15 units daily Last A1c on 07/03/2020 was 6.0; hemoglobin A1c will be checked  Abnormal EKG EKG showed A. fib with rate control Patient does not seem to have a history of atrial fibrillation, however, she was taking Toprol which we shall continue at this time. Continue telemetry and consider DOAC if patient continues to be in atrial fibrillation  Chronic diastolic CHF ECHO 09/6166 EF 60-65% with G1 DD Continue Toprol, Lipitor  ESRD on HD (TTS) Nephrology be consulted for maintenance dialysis  Essential hypertension Continue Toprol  Mixed hyperlipidemia Continue Lipitor  History of stroke Continue Plavix, Lipitor  DVT prophylaxis: Lovenox  Code Status: Full code  Family Communication: None at bedside  Disposition Plan:  Patient is from:                         home Anticipated DC to:                   SNF or family members home Anticipated DC date:               2-3 days Anticipated DC barriers:          Patient requires inpatient management due to CAP POA requiring IV antibiotics    Consults called: Nephrology  Admission status: Inpatient    Bernadette Hoit MD [facility]  01/15/2021, 4:01 AM

## 2021-01-16 ENCOUNTER — Encounter (HOSPITAL_COMMUNITY): Payer: Self-pay | Admitting: Oncology

## 2021-01-16 ENCOUNTER — Other Ambulatory Visit (HOSPITAL_COMMUNITY): Payer: Self-pay

## 2021-01-16 ENCOUNTER — Inpatient Hospital Stay (HOSPITAL_COMMUNITY): Payer: Medicare Other

## 2021-01-16 DIAGNOSIS — I4891 Unspecified atrial fibrillation: Secondary | ICD-10-CM | POA: Diagnosis not present

## 2021-01-16 LAB — CBC
HCT: 33.9 % — ABNORMAL LOW (ref 36.0–46.0)
Hemoglobin: 10.2 g/dL — ABNORMAL LOW (ref 12.0–15.0)
MCH: 28.6 pg (ref 26.0–34.0)
MCHC: 30.1 g/dL (ref 30.0–36.0)
MCV: 95 fL (ref 80.0–100.0)
Platelets: 277 10*3/uL (ref 150–400)
RBC: 3.57 MIL/uL — ABNORMAL LOW (ref 3.87–5.11)
RDW: 15.9 % — ABNORMAL HIGH (ref 11.5–15.5)
WBC: 10.3 10*3/uL (ref 4.0–10.5)
nRBC: 0 % (ref 0.0–0.2)

## 2021-01-16 LAB — URINALYSIS, ROUTINE W REFLEX MICROSCOPIC
Bilirubin Urine: NEGATIVE
Glucose, UA: 50 mg/dL — AB
Hgb urine dipstick: NEGATIVE
Ketones, ur: NEGATIVE mg/dL
Nitrite: NEGATIVE
Protein, ur: 100 mg/dL — AB
Specific Gravity, Urine: 1.015 (ref 1.005–1.030)
WBC, UA: >50 WBC/hpf — ABNORMAL HIGH (ref 0–5)
pH: 7 (ref 5.0–8.0)

## 2021-01-16 LAB — ECHOCARDIOGRAM COMPLETE
AR max vel: 1.58 cm2
AV Area VTI: 1.85 cm2
AV Area mean vel: 1.64 cm2
AV Mean grad: 6 mmHg
AV Peak grad: 13 mmHg
Ao pk vel: 1.8 m/s
Area-P 1/2: 2.5 cm2
Height: 65 in
MV VTI: 1.83 cm2
S' Lateral: 2.3 cm
Weight: 2522.06 oz

## 2021-01-16 LAB — GLUCOSE, CAPILLARY
Glucose-Capillary: 124 mg/dL — ABNORMAL HIGH (ref 70–99)
Glucose-Capillary: 118 mg/dL — ABNORMAL HIGH (ref 70–99)
Glucose-Capillary: 171 mg/dL — ABNORMAL HIGH (ref 70–99)

## 2021-01-16 LAB — RENAL FUNCTION PANEL
Albumin: 2.9 g/dL — ABNORMAL LOW (ref 3.5–5.0)
Anion gap: 12 (ref 5–15)
BUN: 34 mg/dL — ABNORMAL HIGH (ref 8–23)
CO2: 24 mmol/L (ref 22–32)
Calcium: 8.3 mg/dL — ABNORMAL LOW (ref 8.9–10.3)
Chloride: 102 mmol/L (ref 98–111)
Creatinine, Ser: 4.64 mg/dL — ABNORMAL HIGH (ref 0.44–1.00)
GFR, Estimated: 10 mL/min — ABNORMAL LOW (ref >60)
Glucose, Bld: 129 mg/dL — ABNORMAL HIGH (ref 70–99)
Phosphorus: 7.3 mg/dL — ABNORMAL HIGH (ref 2.5–4.6)
Potassium: 4.4 mmol/L (ref 3.5–5.1)
Sodium: 138 mmol/L (ref 135–145)

## 2021-01-16 LAB — STREP PNEUMONIAE URINARY ANTIGEN: Strep Pneumo Urinary Antigen: NEGATIVE

## 2021-01-16 LAB — TSH: TSH: 1.138 u[IU]/mL (ref 0.350–4.500)

## 2021-01-16 LAB — HIV ANTIBODY (ROUTINE TESTING W REFLEX): HIV Screen 4th Generation wRfx: NONREACTIVE

## 2021-01-16 MED ORDER — CHLORHEXIDINE GLUCONATE CLOTH 2 % EX PADS
6.0000 | MEDICATED_PAD | Freq: Every day | CUTANEOUS | Status: DC
  Administered 2021-01-17: 6 via TOPICAL

## 2021-01-16 NOTE — Progress Notes (Addendum)
Speech Language Pathology Treatment: Dysphagia  Patient Details Name: Kelli [name] MRN: [id number] DOB: [birth year] Today's Date: 01/16/2021 Time: 7619-5093 SLP Time Calculation (min) (ACUTE ONLY): 25 min  Assessment / Plan / Recommendation Clinical Impression  Pt seen for a dysphagia tx with intake of various consistencies with overt s/s of aspiration noted with mixed consistencies (ie: pineapple) only during larger volume/bites.  Pt consumed thin via straw, puree and soft solids/regular consistency with small bites/sips and swallow completed prior to next bite without overt s/s of aspiration noted.  Pt exhibited a decreased appetite, so limited amount of intake noted this session, but discussed various swallowing strategies with spouse who was present for dysphagia tx who provides care for pt at home per report.  Recommended continuing self regulated regular diet with thin liquids eliminating mixed consistencies (ie: vegetable soup, cereal, certain fruits) at current time d/t deconditioning and increased risk for aspiration.  Pt has a hx of dysphagia and CVA paired with deconditioning places her at a higher risk (mild-mod) for aspiration without swallowing/aspiration precautions in place during meals.  Husband/pt in agreement to utilize strategies during acute stay and at home after discharge.  Recommended an OP MBSS if symptoms persist with reported intermittent coughing during meals.  Pt/husband in agreement.  ST will s/o for dysphagia as pt is consuming current diet with swallowing strategies/aspiration precautions in place with minimal risk for aspiration.  Speech/language assessment pending as pt kindly deferred speech/language evaluation and asked to complete at a later date.  HPI HPI: Patient 70 year old female history of ESRD on HD Tuesday Thursday Saturday, history of cryptogenic stroke with left hemiplegia, bedbound, type 2 diabetes, chronic diastolic CHF, hypertension, hyperlipidemia, anxiety  presented to the ED with 1 week of cough, sputum clear, congestion, shortness of breath seen by PCP trial of outpatient antibiotics however with no significant improvement patient presented to the ED.  COVID-19 PCR, influenza negative.  CBC noted a leukocytosis.  Patient noted to have sats of 91 to 96% on room air.  Chest x-ray done with small opacity noted in left costophrenic angle area could be due to atelectasis versus small pneumonia minimal pleural effusion.  Patient placed empirically on IV Rocephin and azithromycin to treat empirically for pneumonia.  Nephrology consulted for ongoing HD.  Patient noted to be in A. fib new onset no prior history and cardiology consulted. BSE requested      SLP Plan  Discharge SLP treatment due to (comment)      Recommendations for follow up therapy are one component of a multi-disciplinary discharge planning process, led by the attending physician.  Recommendations may be updated based on patient status, additional functional criteria and insurance authorization.    Recommendations  Diet recommendations: Regular;Thin liquid Liquids provided via: Cup;Straw Medication Administration: Whole meds with liquid Supervision: Full supervision/cueing for compensatory strategies;Trained caregiver to feed patient Compensations: Slow rate;Small sips/bites;Effortful swallow;Other (Comment) (eliminate mixed consistencies while pt is deconditioned) Postural Changes and/or Swallow Maneuvers: Seated upright 90 degrees                Oral Care Recommendations: Oral care BID Follow Up Recommendations: Other (comment) (OP MBS prn if symptoms worsen or persist with intermittent dysphagia) Assistance recommended at discharge: Intermittent Supervision/Assistance SLP Visit Diagnosis: Dysphagia, unspecified (R13.10) Plan: Discharge SLP treatment due to (comment)                       Elvina Sidle, M.S., CCC-SLP  01/16/2021, 1:53 PM

## 2021-01-16 NOTE — Progress Notes (Signed)
Progress Note  Patient Name: Kelli [name] Date of Encounter: 01/16/2021  Primary Cardiologist: Dr. Cristopher Peru  Subjective   Calm this morning, reportedly was somewhat agitated yesterday afternoon.  Inpatient Medications    Scheduled Meds:  apixaban  5 mg Oral BID   atorvastatin  40 mg Oral QPM   calcium acetate  1,334 mg Oral TID WC   Chlorhexidine Gluconate Cloth  6 each Topical Q0600   dextromethorphan-guaiFENesin  1 tablet Oral BID   famotidine  20 mg Oral Daily   feeding supplement (GLUCERNA SHAKE)  237 mL Oral TID BM   fluticasone  2 spray Each Nare Daily   insulin aspart  0-6 Units Subcutaneous Q6H   ipratropium-albuterol  3 mL Nebulization TID   loratadine  10 mg Oral Daily   metoprolol succinate  100 mg Oral Daily   mometasone-formoterol  2 puff Inhalation BID   Continuous Infusions:  azithromycin 500 mg (01/15/21 2135)   cefTRIAXone (ROCEPHIN)  IV Stopped (01/15/21 1024)   PRN Meds: ipratropium-albuterol, ondansetron (ZOFRAN) IV   Vital Signs    Vitals:   01/15/21 2027 01/15/21 2054 01/16/21 0450 01/16/21 0743  BP:  (!) 172/59 121/69   Pulse: 65 97 96   Resp: 18 19 18    Temp:  98 F (36.7 C) 97.7 F (36.5 C)   TempSrc:      SpO2: 92% 98% 95% 96%  Weight:      Height:        Intake/Output Summary (Last 24 hours) at 01/16/2021 0925 Last data filed at 01/16/2021 0600 Gross per 24 hour  Intake 1088.42 ml  Output 2400 ml  Net -1311.58 ml   Filed Weights   01/15/21 0426 01/15/21 1058 01/15/21 1458  Weight: 72.7 kg 73 kg 71.5 kg    Telemetry    Atrial fibrillation with mild RVR.  Personally reviewed.  ECG    No ECG reviewed.  Physical Exam   GEN: No acute distress.   Neck: Difficult to assess JVP. Cardiac: Irregularly irregular, 1/6 systolic murmur without gallop.Marland Kitchen  Respiratory: Nonlabored.  Scattered rhonchi with anterior expiratory wheezing. GI: Soft, nontender, bowel sounds present. MS: No leg edema.  Labs     Chemistry Recent Labs  Lab 01/14/21 1949 01/15/21 0404 01/16/21 0601  NA 138 137 138  K 3.7 3.5 4.4  CL 100 101 102  CO2 21* 22 24  GLUCOSE 170* 128* 129*  BUN 41* 42* 34*  CREATININE 5.48* 5.70* 4.64*  CALCIUM 7.9* 7.7* 8.3*  PROT 6.7 6.3*  --   ALBUMIN 3.1* 2.9* 2.9*  AST 22 15  --   ALT 21 19  --   ALKPHOS 83 77  --   BILITOT 0.4 0.7  --   GFRNONAA 8* 8* 10*  ANIONGAP 17* 14 12     Hematology Recent Labs  Lab 01/14/21 1949 01/15/21 0404 01/16/21 0601  WBC 11.9* 11.2* 10.3  RBC 3.87 3.72* 3.57*  HGB 11.2* 10.6* 10.2*  HCT 36.5 35.4* 33.9*  MCV 94.3 95.2 95.0  MCH 28.9 28.5 28.6  MCHC 30.7 29.9* 30.1  RDW 16.3* 16.2* 15.9*  PLT 300 296 277    Radiology    DG Chest Portable 1 View  Result Date: 01/14/2021 CLINICAL DATA:  Shortness of breath and chest congestion.  No fever. EXAM: PORTABLE CHEST 1 VIEW COMPARISON:  Portable chest 07/03/2020. FINDINGS: Right IJ dialysis catheter tip is in the upper right atrium. Left chest loop recorder device is again noted as  well. A vascular stent in the right axilla is also redemonstrated. There is mild cardiomegaly with normal caliber central vasculature. There is patchy aortic calcific plaque. Small amount of opacity noted in the left costophrenic angle and could indicate atelectasis, small infiltrate or minimal pleural effusion. The remaining lungs are clear.  Generalized osteopenia. IMPRESSION: Small opacity left costophrenic angle area, which could be due to atelectasis, small pneumonia or minimal pleural effusion. Follow-up PA and lateral chest recommended. Electronically Signed   By: Telford Nab M.D.   On: 01/14/2021 20:06    Cardiac Studies   Echocardiogram 09/12/2019:  1. Left ventricular ejection fraction, by estimation, is 60 to 65%. The  left ventricle has normal function. The left ventricle has no regional  wall motion abnormalities. There is moderate concentric left ventricular  hypertrophy. Left ventricular   diastolic parameters are consistent with Grade I diastolic dysfunction  (impaired relaxation).   2. Right ventricular systolic function is normal. The right ventricular  size is normal. There is normal pulmonary artery systolic pressure.   3. Left atrial size was severely dilated.   4. The mitral valve is normal in structure. No evidence of mitral valve  regurgitation. No evidence of mitral stenosis.   5. The aortic valve is tricuspid. Aortic valve regurgitation is not  visualized. No aortic stenosis is present.   6. The inferior vena cava is normal in size with greater than 50%  respiratory variability, suggesting right atrial pressure of 3 mmHg.   Assessment & Plan    1.  Persistent atrial fibrillation of uncertain duration (ECG in May showed atrial fibrillation) and CHA2DS2-VASc score of 7.  She has been started on Eliquis for stroke prophylaxis.  Follow-up echocardiogram is pending.  On Toprol-XL for heart rate control.  2.  History of stroke with residual neurological deficits and limited functional status.  Plavix has been stopped with conversion to Eliquis.  3.  ESRD on hemodialysis.  Currently via temporary dialysis catheter with occlusion of both upper extremity AV fistulas.  4.  Community-acquired pneumonia.  Continue plan for heart rate control and anticoagulation.  Eliquis initiated, continue Toprol-XL and titrate as needed for heart rate control.  Follow-up echocardiogram pending.  Do not anticipate plan for cardioversion.  With severely dilated left atrium by last assessment, unlikely that she would maintain sinus rhythm long-term.  Signed, Rozann Lesches, MD  01/16/2021, 9:25 AM

## 2021-01-16 NOTE — TOC Initial Note (Signed)
Transition of Care [facility]) - Initial/Assessment Note    Patient Details  Name: Kelli [name] MRN: [id number] Date of Birth: [birth year]  [facility]) CM/SW Contact:    Iona Beard, [place] Phone Number: 01/16/2021, 2:41 PM  Clinical Narrative:                 Pt is high risk for readmission. CSW spoke with pt and her husband in room to complete assessment. Pts husband states that he has to assist pt with all ADLs. [facility] transports pt to HD appointments. Pt has not had any HH services. Pt has a hospital bed, shower bench, bsc, and walker to use in the home.   CSW spoke to pts husband about cost of new Eliquis prescription. Pts husband states they are on a fixed income and he will call his insurance company. CSW explained that we can provide a coupon card for a free 30 day supply. CSW also spoke with pts cardiologist Dr. Domenic Polite who states that pts husband can come by the office and pick up a 14 day supply pack to assist. CSW to update husband of this. TOC to follow.   Expected Discharge Plan: Home/Self Care Barriers to Discharge: Continued Medical Work up   Patient Goals and CMS Choice Patient states their goals for this hospitalization and ongoing recovery are:: Return home with husband CMS Medicare.gov Compare Post Acute Care list provided to:: Patient Choice offered to / list presented to : Patient  Expected Discharge Plan and Services Expected Discharge Plan: Home/Self Care In-house Referral: Clinical Social Work Discharge Planning Services: CM Consult   Living arrangements for the past 2 months: Single Family Home                                      Prior Living Arrangements/Services Living arrangements for the past 2 months: Single Family Home Lives with:: Spouse Patient language and need for interpreter reviewed:: Yes Do you feel safe going back to the place where you live?: Yes      Need for Family Participation in Patient Care: Yes (Comment) Care  giver support system in place?: Yes (comment) Current home services: DME Criminal Activity/Legal Involvement Pertinent to Current Situation/Hospitalization: No - Comment as needed  Activities of Daily Living Home Assistive Devices/Equipment: Wheelchair ADL Screening (condition at time of admission) [name] cognitive ability adequate to safely complete daily activities?: Yes Is the patient deaf or have difficulty hearing?: No Does the patient have difficulty seeing, even when wearing glasses/contacts?: No Does the patient have difficulty concentrating, remembering, or making decisions?: No Patient able to express need for assistance with ADLs?: Yes Does the patient have difficulty dressing or bathing?: Yes Independently performs ADLs?: No Communication: Independent Dressing (OT): Dependent Is this a change from baseline?: Pre-admission baseline Grooming: Dependent Is this a change from baseline?: Pre-admission baseline Feeding: Needs assistance Is this a change from baseline?: Pre-admission baseline Bathing: Dependent Is this a change from baseline?: Pre-admission baseline Toileting: Dependent Is this a change from baseline?: Pre-admission baseline In/Out Bed: Dependent Is this a change from baseline?: Pre-admission baseline Walks in Home: Dependent Is this a change from baseline?: Pre-admission baseline Does the patient have difficulty walking or climbing stairs?: Yes Weakness of Legs: Both Weakness of Arms/Hands: Both  Permission Sought/Granted                  Emotional Assessment Appearance:: Appears  stated age Attitude/Demeanor/Rapport: Engaged Affect (typically observed): Accepting Orientation: : Oriented to Self, Oriented to Place, Oriented to  Time, Oriented to Situation Alcohol / Substance Use: Not Applicable Psych Involvement: No (comment)  Admission diagnosis:  CAP (community acquired pneumonia) [J18.9] End stage renal disease on dialysis ([place]) [N18.6,  Z99.2] Community acquired pneumonia of right lower lobe of lung [J18.9] Patient Active Problem List   Diagnosis Date Noted   CAP (community acquired pneumonia) 01/15/2021   Hypoalbuminemia due to protein-calorie malnutrition ([place]) 01/15/2021   Mixed hyperlipidemia 01/15/2021   New onset a-fib ([place]) 01/15/2021   Adhesive capsulitis of shoulder 07/24/2020   Essential hypertension 07/03/2020   Hemodialysis catheter dysfunction, initial encounter ([place]) 07/02/2020   Mixed diabetic hyperlipidemia associated with type 2 diabetes mellitus ([place]) 07/02/2020   Ischemic chest pain ([place]) 09/11/2019   Chest pain 09/11/2019   Impingement syndrome of shoulder region 12/06/2017   History of arthroscopic procedure on shoulder 11/08/2017   Rotator cuff impingement syndrome 08/25/2017   Occult GI bleeding 01/28/2017   TIA (transient ischemic attack) 01/28/2017   Congestive heart failure ([place])    ESRD on hemodialysis ([place])    Pressure injury of skin 01/23/2017   End stage renal disease on dialysis ([place])    Gastritis and duodenitis    Acute metabolic encephalopathy 88/50/2774   Palliative care encounter    Goals of care, counseling/discussion    DNR (do not resuscitate) discussion    Acute renal failure superimposed on stage 4 chronic kidney disease ([place]) 01/09/2017   Precordial pain    CKD (chronic kidney disease), stage IV ([place]) 01/07/2017   Acute respiratory failure with hypoxia ([place]) 01/07/2017   Anemia 01/06/2017   Type 2 diabetes mellitus with chronic kidney disease on chronic dialysis, with long-term current use of insulin ([place]) 01/06/2017   Onychomycosis 01/05/2017   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   B12 deficiency 11/18/2016   Anemia in chronic kidney disease 11/18/2016   PUD (peptic ulcer disease) 10/13/2016   Pacemaker 10/13/2016   Chronic diastolic CHF (congestive heart failure) ([place]) 10/13/2016   Elevated troponin    Hypovolemic shock ([place]) 04/26/2014   Upper GI bleed     Leukocytosis, unspecified 07/30/2013   Thrombocytosis 07/30/2013   Leukocytosis 07/30/2013   Subluxation of left shoulder joint 07/27/2013   Closed dislocation of left humerus 07/26/2013   Hyperglycemia due to diabetes mellitus ([place]) 07/25/2013   Normocytic anemia 07/25/2013   Spastic hemiplegia affecting nondominant side ([place]) 07/06/2013   Alterations of sensations, late effect of cerebrovascular disease(438.6) 07/06/2013   Alterations of sensations, late effect of cerebrovascular disease 07/06/2013   UTI (urinary tract infection) 06/15/2013   Edema 05/28/2013   Knee pain 05/28/2013   Carotid stenosis, bilateral 05/22/2013   Fall at nursing home 05/20/2013   Hip pain 05/20/2013   Pain in joint, lower leg 05/19/2013   Chest congestion 05/12/2013   E. coli UTI 05/07/2013   Left shoulder pain due to subluxation and/or adhesive capsulitis.  05/07/2013   Pain in left shoulder 05/07/2013   Acute CVA (cerebrovascular accident): R PCA infarct per MRI 04/13/13 04/13/2013   Cerebral infarction ([place]) 04/13/2013   CVA (cerebral infarction) 04/08/2013   Acute ischemic stroke ([place]) 04/08/2013   Diplopia 04/08/2013   Left-sided weakness 04/08/2013   Vertigo 04/08/2013   Paresthesias in left hand 04/08/2013   Cerebellar stroke ([place]) 04/08/2013   Dizziness and giddiness 04/08/2013   Stroke ([place]) 04/08/2013   Depression 04/06/2013   Peripheral arterial disease ([place]) 03/16/2013  History of stroke 01/30/2013   CKD (chronic kidney disease) stage 3, GFR 30-59 ml/min (HCC) 01/16/2013   Generalized anxiety disorder 12/08/2012   Lumbago 12/08/2012   Anxiety state 12/08/2012   Urinary frequency 11/14/2012   Occlusion and stenosis of carotid artery with cerebral infarction 10/05/2012   Cerebral infarction due to unspecified occlusion or stenosis of unspecified carotid arteries 10/05/2012   Tobacco abuse 10/04/2012   Obesity 10/04/2012   PCP:  Manon Hilding, MD Pharmacy:   [facility],  [place] [address] [place] [address] Phone: [phone] Fax: [phone]     Social Determinants of Health (SDOH) Interventions    Readmission Risk Interventions Readmission Risk Prevention Plan 01/16/2021  Transportation Screening Complete  HRI or Home Care Consult Complete  Social Work Consult for [place] Planning/Counseling Complete  Palliative Care Screening Not Applicable  Medication Review Press photographer) Complete  Some recent data might be hidden

## 2021-01-16 NOTE — Progress Notes (Addendum)
[facility] NEPHROLOGY PROGRESS NOTE  Assessment/ Plan: Pt is a 70 y.o. yo female  with history of [name], Kelli [name], Kelli [name], Kelli [name], Kelli [name], Kelli [name], Kelli [name], Kelli [name], Kelli [name]   [facility], TTS, RIJ TDC.  #Community-acquired pneumonia: Currently on ceftriaxone azithromycin.  In room air.  No increased work of breathing.  Clinically stable.   # Kelli, TTS [name] [facility]: Status post dialysis yesterday with around 2 L UF, tolerated well except low blood flow from the catheter, required tPA.  Plan for regular HD tomorrow, Informed HD nurse.  She has right IJ TDC, reportedly AV fistula failed in the past.     # [name]: Currently on metoprolol.  Monitor BP after dialysis.  UF as tolerated.   # Anemia of Kelli: Hemoglobin [name] goal.     # Metabolic Bone Disease: Low calcium and hyperphosphatemia noted.  Started PhosLo to take with each meal.  Monitor lab.  #Persistent A. fib: Started Eliquis by cardiologist, pending echocardiogram.  On metoprolol.   #History of Kelli [name], Kelli [name]: Getting echo.  Subjective: Kelli and examined [name] bedside.  Patient is sleeping.  Her husband said that yesterday afternoon and in the evening she was wide-awake but tends to sleep in the morning.  She was able to awaken and I was able to talk to her but falls back to sleep easily.    Objective Vital signs in last 24 hours: Vitals:   01/15/21 2027 01/15/21 2054 01/16/21 0450 01/16/21 0743  BP:  (!) 172/59 121/69   Pulse: 65 97 96   Resp: 18 19 18    Temp:  98 F (36.7 C) 97.7 F (36.5 C)   TempSrc:      SpO2: 92% 98% 95% 96%  Weight:      Height:       Weight change: -17.7 kg  Intake/Output Summary (Last 24 hours) [name] 01/16/2021 1113 Last data filed [name] 01/16/2021 0900 Gross per 24 hour  Intake 1328.42  ml  Output 2600 ml  Net -1271.58 ml       Labs: Basic Metabolic Panel: Recent Labs  Lab 01/14/21 1949 01/15/21 0404 01/16/21 0601  NA 138 137 138  K 3.7 3.5 4.4  CL 100 101 102  CO2 21* 22 24  GLUCOSE 170* 128* 129*  BUN 41* 42* 34*  CREATININE 5.48* 5.70* 4.64*  CALCIUM 7.9* 7.7* 8.3*  PHOS  --  7.9* 7.3*   Liver Function Tests: Recent Labs  Lab 01/14/21 1949 01/15/21 0404 01/16/21 0601  AST 22 15  --   ALT 21 19  --   ALKPHOS 83 77  --   BILITOT 0.4 0.7  --   PROT 6.7 6.3*  --   ALBUMIN 3.1* 2.9* 2.9*   No results for input(s): LIPASE, AMYLASE in the last 168 hours. No results for input(s): AMMONIA in the last 168 hours. CBC: Recent Labs  Lab 01/14/21 1949 01/15/21 0404 01/16/21 0601  WBC 11.9* 11.2* 10.3  NEUTROABS 8.0*  --   --   HGB 11.2* 10.6* 10.2*  HCT 36.5 35.4* 33.9*  MCV 94.3 95.2 95.0  PLT 300 296 277   Cardiac Enzymes: No results for input(s): CKTOTAL, CKMB, CKMBINDEX, TROPONINI in the last 168 hours. CBG: Recent Labs  Lab 01/15/21 0732 01/15/21 1138 01/15/21 1629 01/15/21  2142 01/16/21 0502  GLUCAP 116* 115* 114* 159* 124*    Iron Studies: No results for input(s): IRON, TIBC, TRANSFERRIN, FERRITIN in the last 72 hours. Studies/Results: DG Chest Portable 1 View  Result Date: 01/14/2021 CLINICAL DATA:  Kelli of breath and chest [name].  No fever. EXAM: PORTABLE CHEST 1 VIEW COMPARISON:  Portable chest 07/03/2020. FINDINGS: Right IJ dialysis catheter tip is in the upper right atrium. Left chest loop recorder device is again noted as well. A vascular stent in the right axilla is also redemonstrated. There is mild cardiomegaly with normal caliber central vasculature. There is patchy aortic calcific plaque. Small amount of opacity noted in the left costophrenic angle and could indicate atelectasis, small infiltrate or minimal pleural effusion. The remaining lungs are clear.  Generalized osteopenia. IMPRESSION: Small opacity left  costophrenic angle area, which could be due to atelectasis, small pneumonia or minimal pleural effusion. Follow-up PA and lateral chest recommended. Electronically Signed   By: Telford Nab M.D.   On: 01/14/2021 20:06    Medications: Infusions:  azithromycin 500 mg (01/15/21 2135)   cefTRIAXone (ROCEPHIN)  IV 2 g (01/16/21 0943)    Scheduled Medications:  apixaban  5 mg Oral BID   atorvastatin  40 mg Oral QPM   calcium acetate  1,334 mg Oral TID WC   Chlorhexidine Gluconate Cloth  6 each Topical Q0600   dextromethorphan-guaiFENesin  1 tablet Oral BID   famotidine  20 mg Oral Daily   feeding supplement (GLUCERNA SHAKE)  237 mL Oral TID BM   fluticasone  2 spray Each Nare Daily   insulin aspart  0-6 Units Subcutaneous Q6H   ipratropium-albuterol  3 mL Nebulization TID   loratadine  10 mg Oral Daily   metoprolol succinate  100 mg Oral Daily   mometasone-formoterol  2 puff Inhalation BID    have reviewed scheduled and prn medications.  Physical Exam: General:NAD, comfortable Heart:RRR, s1s2 nl Lungs:clear b/l, no crackle Abdomen:soft, Non-tender, non-distended Extremities:No edema Dialysis Access: Right IJ TDC.  Emberlyn Burlison Prasad Mischele Detter 01/16/2021,11:13 AM  LOS: 1 day

## 2021-01-16 NOTE — Progress Notes (Signed)
Patient has order to obtain urine culture, however patient had no output this shift and bladder scanned <50. Will report to day shift.

## 2021-01-16 NOTE — TOC Benefit Eligibility Note (Signed)
Patient Teacher, English as a foreign language completed.    The patient is currently admitted and upon discharge could be taking Eliquis 5 mg.  The current 30 day co-pay is, $515.59 due to a $468.59 deductible remaining.   The patient is insured through [employer], [place] Patient Advocate Specialist [place] Patient Advocate Team Direct Number: [phone]  Fax: [phone]

## 2021-01-16 NOTE — Care Management Important Message (Signed)
Important Message  Patient Details  Name: Kelli [name] MRN: [id number] Date of Birth: 01/11/1951   Medicare Important Message Given:  Yes     Tommy Medal 01/16/2021, 3:48 PM

## 2021-01-16 NOTE — Progress Notes (Signed)
PROGRESS NOTE    Kelli [name]  VQM:[id number] DOB: 21-Sep-1950 DOA: 01/14/2021 PCP: Manon Hilding, MD   Brief Narrative:   Patient 70 year old female history of ESRD on HD Tuesday Thursday Saturday, history of cryptogenic stroke with left hemiplegia, bedbound, type 2 diabetes, chronic diastolic CHF, hypertension, hyperlipidemia, anxiety presented to the ED with 1 week of cough, sputum clear, congestion, shortness of breath seen by PCP trial of outpatient antibiotics however with no significant improvement patient presented to the ED.  COVID-19 PCR, influenza negative.  CBC noted a leukocytosis.  Patient noted to have sats of 91 to 96% on room air.  Chest x-ray done with small opacity noted in left costophrenic angle area could be due to atelectasis versus small pneumonia minimal pleural effusion.  Patient placed empirically on IV Rocephin and azithromycin to treat empirically for pneumonia.  Nephrology consulted for ongoing HD.  Patient noted to be in A. fib new onset no prior history and cardiology has started her on Toprol as well as Eliquis.  2D echocardiogram pending.  Assessment & Plan:   Principal Problem:   CAP (community acquired pneumonia) Active Problems:   History of stroke   Hyperglycemia due to diabetes mellitus ([place])   Chronic diastolic CHF (congestive heart failure) ([facility])   End stage renal disease on dialysis [facility])   Essential hypertension   Hypoalbuminemia due to protein-calorie malnutrition ([place])   Mixed hyperlipidemia   New onset a-fib ([place])   #1 community-acquired pneumonia, POA -Patient presented with symptoms concerning for pneumonia with cough, sputum production, shortness of breath. -Chest x-ray done concerning for atelectasis versus pneumonia. -Urine Legionella antigen pending, urine strep pneumococcus antigen pending.  Patient pancultured. -Continue Mucinex, incentive spirometry, flutter valve, empiric IV Rocephin, IV azithromycin. -Placed on Pepcid, Flonase,  Claritin, scheduled duo nebs, Dulera. -Chest PT.  2.  Well-controlled type 2 diabetes mellitus with hyperglycemia -Last hemoglobin A1c 6.0 07/03/2020. -Repeat hemoglobin A1c 5.0 (01/15/2021). -CBG 116 this morning. -Patient sedated/drowsy and is/diet being changed to NPO. -Discontinue long-acting Semglee. -Continue SSI.  3.  New onset A. fib -EKG noted with A. fib rate controlled. -On review of chart patient noted to be in A. fib 06/2020 however also remained rate controlled at that time. -Patient last seen by cardiology 11/2019. -Patient with prior history of cryptogenic CVA, status post loop recorder and being followed by cardiology in the outpatient setting on Plavix. -2D echo from 09/12/2019 with severely dilated left atrium, EF of 60 to 65%,NWMA. -Patient with CHA2DS2-VASc 2 score 7. -TSH 1.138.  Repeat 2D echo currently pending -Currently rate controlled on Toprol-XL which we will continue. -Consulted cardiology with 2D echocardiogram pending and Eliquis initiated with Plavix discontinued  4.  Chronic diastolic CHF -Stable. -Patient looks a little more on the dry side. -Continue Toprol-XL, Lipitor.  5.  End-stage renal disease on HD ( T/TH/SAT) -Consulted with nephrology who are following and patient to be scheduled for hemodialysis tomorrow.  6.  Hypertension -Toprol-XL to continue  7.  Hyperlipidemia -Statin.  8.  History of CVA -Was on Lipitor and Plavix. -Plavix now discontinued and patient on Eliquis -Patient noted to be in new onset A. fib last noted per EKG 06/2020. -Outpatient follow-up.  9.  Mild protein calorie malnutrition -Nutritional supplementation.     DVT prophylaxis: Eliquis Code Status: Full Family Communication: Updated husband at bedside 11/18 Disposition:    Status is: Inpatient   Remains inpatient appropriate because: Severity of illness       Consultants:  Cardiology  Dr. Domenic Polite Nephrology: Dr. Carolin Sicks    Procedures:  Chest x-ray  01/14/2021   Antimicrobials:  IV azithromycin 01/15/2021>>>> IV Rocephin 01/15/2021>>>>.   Subjective: Patient seen and evaluated today and is quite drowsy this morning.  No acute overnight events noted.  Husband at bedside.  Objective: Vitals:   01/15/21 2027 01/15/21 2054 01/16/21 0450 01/16/21 0743  BP:  (!) 172/59 121/69   Pulse: 65 97 96   Resp: 18 19 18    Temp:  98 F (36.7 C) 97.7 F (36.5 C)   TempSrc:      SpO2: 92% 98% 95% 96%  Weight:      Height:        Intake/Output Summary (Last 24 hours) at 01/16/2021 1247 Last data filed at 01/16/2021 0900 Gross per 24 hour  Intake 1328.42 ml  Output 2600 ml  Net -1271.58 ml   Filed Weights   01/15/21 0426 01/15/21 1058 01/15/21 1458  Weight: 72.7 kg 73 kg 71.5 kg    Examination:  General exam: Appears calm and comfortable  Respiratory system: Clear to auscultation. Respiratory effort normal. Cardiovascular system: S1 & S2 heard, irregular rate.  Gastrointestinal system: Abdomen is soft Central nervous system: Alert and awake Extremities: No edema Skin: No significant lesions noted Psychiatry: Flat affect.    Data Reviewed: I have personally reviewed following labs and imaging studies  CBC: Recent Labs  Lab 01/14/21 1949 01/15/21 0404 01/16/21 0601  WBC 11.9* 11.2* 10.3  NEUTROABS 8.0*  --   --   HGB 11.2* 10.6* 10.2*  HCT 36.5 35.4* 33.9*  MCV 94.3 95.2 95.0  PLT 300 296 527   Basic Metabolic Panel: Recent Labs  Lab 01/14/21 1949 01/15/21 0404 01/16/21 0601  NA 138 137 138  K 3.7 3.5 4.4  CL 100 101 102  CO2 21* 22 24  GLUCOSE 170* 128* 129*  BUN 41* 42* 34*  CREATININE 5.48* 5.70* 4.64*  CALCIUM 7.9* 7.7* 8.3*  MG  --  1.8  --   PHOS  --  7.9* 7.3*   GFR: Estimated Creatinine Clearance: 11.2 mL/min (A) (by C-G formula based on SCr of 4.64 mg/dL (H)). Liver Function Tests: Recent Labs  Lab 01/14/21 1949 01/15/21 0404 01/16/21 0601  AST 22 15  --   ALT 21 19  --   ALKPHOS 83  77  --   BILITOT 0.4 0.7  --   PROT 6.7 6.3*  --   ALBUMIN 3.1* 2.9* 2.9*   No results for input(s): LIPASE, AMYLASE in the last 168 hours. No results for input(s): AMMONIA in the last 168 hours. Coagulation Profile: Recent Labs  Lab 01/15/21 0404  INR 1.2   Cardiac Enzymes: No results for input(s): CKTOTAL, CKMB, CKMBINDEX, TROPONINI in the last 168 hours. BNP (last 3 results) No results for input(s): PROBNP in the last 8760 hours. HbA1C: Recent Labs    01/15/21 0404  HGBA1C 5.0   CBG: Recent Labs  Lab 01/15/21 1138 01/15/21 1629 01/15/21 2142 01/16/21 0502 01/16/21 1225  GLUCAP 115* 114* 159* 124* 118*   Lipid Profile: No results for input(s): CHOL, HDL, LDLCALC, TRIG, CHOLHDL, LDLDIRECT in the last 72 hours. Thyroid Function Tests: Recent Labs    01/16/21 0601  TSH 1.138   Anemia Panel: No results for input(s): VITAMINB12, FOLATE, FERRITIN, TIBC, IRON, RETICCTPCT in the last 72 hours. Sepsis Labs: Recent Labs  Lab 01/15/21 0404  PROCALCITON 2.08    Recent Results (from the past 240 hour(s))  Resp Panel by  RT-PCR (Flu A&B, Covid) Nasopharyngeal Swab     Status: None   Collection Time: 01/14/21  7:35 PM   Specimen: Nasopharyngeal Swab; Nasopharyngeal(NP) swabs in vial transport medium  Result Value Ref Range Status   SARS Coronavirus 2 by RT PCR NEGATIVE NEGATIVE Final    Comment: (NOTE) SARS-CoV-2 target nucleic acids are NOT DETECTED.  The SARS-CoV-2 RNA is generally detectable in upper respiratory specimens during the acute phase of infection. The lowest concentration of SARS-CoV-2 viral copies this assay can detect is 138 copies/mL. A negative result does not preclude SARS-Cov-2 infection and should not be used as the sole basis for treatment or other patient management decisions. A negative result may occur with  improper specimen collection/handling, submission of specimen other than nasopharyngeal swab, presence of viral mutation(s) within  the areas targeted by this assay, and inadequate number of viral copies(<138 copies/mL). A negative result must be combined with clinical observations, patient history, and epidemiological information. The expected result is Negative.  Fact Sheet for Patients:  EntrepreneurPulse.com.au  Fact Sheet for Healthcare Providers:  IncredibleEmployment.be  This test is no t yet approved or cleared by the [place] FDA and  has been authorized for detection and/or diagnosis of SARS-CoV-2 by FDA under an Emergency Use Authorization (EUA). This EUA will remain  in effect (meaning this test can be used) for the duration of the COVID-19 declaration under Section 564(b)(1) of the Act, 21 U.S.C.section 360bbb-3(b)(1), unless the authorization is terminated  or revoked sooner.       Influenza A by PCR NEGATIVE NEGATIVE Final   Influenza B by PCR NEGATIVE NEGATIVE Final    Comment: (NOTE) The Xpert Xpress SARS-CoV-2/FLU/RSV plus assay is intended as an aid in the diagnosis of influenza from Nasopharyngeal swab specimens and should not be used as a sole basis for treatment. Nasal washings and aspirates are unacceptable for Xpert Xpress SARS-CoV-2/FLU/RSV testing.  Fact Sheet for Patients: EntrepreneurPulse.com.au  Fact Sheet for Healthcare Providers: IncredibleEmployment.be  This test is not yet approved or cleared by the [place] FDA and has been authorized for detection and/or diagnosis of SARS-CoV-2 by FDA under an Emergency Use Authorization (EUA). This EUA will remain in effect (meaning this test can be used) for the duration of the COVID-19 declaration under Section 564(b)(1) of the Act, 21 U.S.C. section 360bbb-3(b)(1), unless the authorization is terminated or revoked.  Performed at [facility], [address] [place], [place] [address]          Radiology Studies: DG Chest Portable 1  View  Result Date: 01/14/2021 CLINICAL DATA:  Shortness of breath and chest congestion.  No fever. EXAM: PORTABLE CHEST 1 VIEW COMPARISON:  Portable chest 07/03/2020. FINDINGS: Right IJ dialysis catheter tip is in the upper right atrium. Left chest loop recorder device is again noted as well. A vascular stent in the right axilla is also redemonstrated. There is mild cardiomegaly with normal caliber central vasculature. There is patchy aortic calcific plaque. Small amount of opacity noted in the left costophrenic angle and could indicate atelectasis, small infiltrate or minimal pleural effusion. The remaining lungs are clear.  Generalized osteopenia. IMPRESSION: Small opacity left costophrenic angle area, which could be due to atelectasis, small pneumonia or minimal pleural effusion. Follow-up PA and lateral chest recommended. Electronically Signed   By: Telford Nab M.D.   On: 01/14/2021 20:06        Scheduled Meds:  apixaban  5 mg Oral BID   atorvastatin  40 mg Oral QPM  calcium acetate  1,334 mg Oral TID WC   Chlorhexidine Gluconate Cloth  6 each Topical Q0600   [START ON 01/17/2021] Chlorhexidine Gluconate Cloth  6 each Topical Q0600   dextromethorphan-guaiFENesin  1 tablet Oral BID   famotidine  20 mg Oral Daily   feeding supplement (GLUCERNA SHAKE)  237 mL Oral TID BM   fluticasone  2 spray Each Nare Daily   insulin aspart  0-6 Units Subcutaneous Q6H   ipratropium-albuterol  3 mL Nebulization TID   loratadine  10 mg Oral Daily   metoprolol succinate  100 mg Oral Daily   mometasone-formoterol  2 puff Inhalation BID   Continuous Infusions:  azithromycin 500 mg (01/15/21 2135)   cefTRIAXone (ROCEPHIN)  IV 2 g (01/16/21 0943)     LOS: 1 day    Time spent: 35 minutes    Coraima Tibbs Darleen Crocker, DO [facility]  If 7PM-7AM, please contact night-coverage [email or web address] 01/16/2021, 12:47 PM

## 2021-01-16 NOTE — Progress Notes (Signed)
*  PRELIMINARY RESULTS* Echocardiogram 2D Echocardiogram has been performed.  Kelli [name] 01/16/2021, 3:58 PM

## 2021-01-17 LAB — GLUCOSE, CAPILLARY
Glucose-Capillary: 114 mg/dL — ABNORMAL HIGH (ref 70–99)
Glucose-Capillary: 123 mg/dL — ABNORMAL HIGH (ref 70–99)

## 2021-01-17 LAB — RENAL FUNCTION PANEL
Albumin: 3.1 g/dL — ABNORMAL LOW (ref 3.5–5.0)
Anion gap: 15 (ref 5–15)
BUN: 45 mg/dL — ABNORMAL HIGH (ref 8–23)
CO2: 20 mmol/L — ABNORMAL LOW (ref 22–32)
Calcium: 8.4 mg/dL — ABNORMAL LOW (ref 8.9–10.3)
Chloride: 103 mmol/L (ref 98–111)
Creatinine, Ser: 5.68 mg/dL — ABNORMAL HIGH (ref 0.44–1.00)
GFR, Estimated: 8 mL/min — ABNORMAL LOW (ref >60)
Glucose, Bld: 128 mg/dL — ABNORMAL HIGH (ref 70–99)
Phosphorus: 7.5 mg/dL — ABNORMAL HIGH (ref 2.5–4.6)
Potassium: 4.8 mmol/L (ref 3.5–5.1)
Sodium: 138 mmol/L (ref 135–145)

## 2021-01-17 LAB — URINE CULTURE: Culture: NO GROWTH

## 2021-01-17 LAB — CBC
HCT: 36.1 % (ref 36.0–46.0)
Hemoglobin: 10.3 g/dL — ABNORMAL LOW (ref 12.0–15.0)
MCH: 27.8 pg (ref 26.0–34.0)
MCHC: 28.5 g/dL — ABNORMAL LOW (ref 30.0–36.0)
MCV: 97.6 fL (ref 80.0–100.0)
Platelets: 268 10*3/uL (ref 150–400)
RBC: 3.7 MIL/uL — ABNORMAL LOW (ref 3.87–5.11)
RDW: 15.8 % — ABNORMAL HIGH (ref 11.5–15.5)
WBC: 10 10*3/uL (ref 4.0–10.5)
nRBC: 0 % (ref 0.0–0.2)

## 2021-01-17 LAB — MAGNESIUM: Magnesium: 2 mg/dL (ref 1.7–2.4)

## 2021-01-17 MED ORDER — LORATADINE 10 MG PO TABS: 10.0000 mg | ORAL_TABLET | Freq: Every day | ORAL | 2 refills | Status: DC

## 2021-01-17 MED ORDER — CEFDINIR 300 MG PO CAPS: 300.0000 mg | ORAL_CAPSULE | Freq: Two times a day (BID) | ORAL | 0 refills | Status: AC

## 2021-01-17 MED ORDER — APIXABAN 5 MG PO TABS: 5.0000 mg | ORAL_TABLET | Freq: Two times a day (BID) | ORAL | 2 refills | Status: DC

## 2021-01-17 MED ORDER — FAMOTIDINE 20 MG PO TABS: 20.0000 mg | ORAL_TABLET | Freq: Every day | ORAL | 3 refills | Status: DC

## 2021-01-17 MED ORDER — HYDRALAZINE HCL 50 MG PO TABS: 50.0000 mg | ORAL_TABLET | Freq: Two times a day (BID) | ORAL | Status: DC

## 2021-01-17 MED ORDER — CALCIUM ACETATE (PHOS BINDER) 667 MG PO CAPS: 1334.0000 mg | ORAL_CAPSULE | Freq: Three times a day (TID) | ORAL | 2 refills | Status: AC

## 2021-01-17 MED ORDER — FLUTICASONE PROPIONATE 50 MCG/ACT NA SUSP: 2.0000 | Freq: Every day | NASAL | 1 refills | Status: DC

## 2021-01-17 MED ORDER — ALBUTEROL SULFATE HFA 108 (90 BASE) MCG/ACT IN AERS: 2.0000 | INHALATION_SPRAY | Freq: Four times a day (QID) | RESPIRATORY_TRACT | 2 refills | Status: DC | PRN

## 2021-01-17 MED ORDER — ONDANSETRON 8 MG PO TBDP: 8.0000 mg | ORAL_TABLET | Freq: Three times a day (TID) | ORAL | 0 refills | Status: AC | PRN

## 2021-01-17 MED ORDER — ONDANSETRON HCL 4 MG PO TABS
ORAL_TABLET | ORAL | Status: AC
  Filled 2021-01-17: qty 1

## 2021-01-17 MED ORDER — HEPARIN SODIUM (PORCINE) 1000 UNIT/ML IJ SOLN
INTRAMUSCULAR | Status: AC
  Administered 2021-01-17: 1000 [IU]
  Filled 2021-01-17: qty 4

## 2021-01-17 MED ORDER — ONDANSETRON HCL 4 MG/2ML IJ SOLN
INTRAMUSCULAR | Status: AC
  Filled 2021-01-17: qty 2

## 2021-01-17 NOTE — Discharge Summary (Signed)
Physician Discharge Summary  Kelli [name] JHE:[id number] DOB: Jun 08, 1950 DOA: 01/14/2021  PCP: Manon Hilding, MD  Admit date: 01/14/2021 Discharge date: 01/17/2021  Time spent: 35 minutes  Recommendations for Outpatient Follow-up:  Repeat chest x-ray in 6-8 weeks to assure complete resolution of infiltrate/atelectatic component. Repeat basic metabolic panel to follow electrolytes stability. Repeat CBC to follow hemoglobin and platelet count. Reassess blood pressure and further adjust antihypertensive treatment as needed. Make sure patient has follow-up with cardiology service as instructed.  Discharge Diagnoses:  Principal Problem:   CAP (community acquired pneumonia) Active Problems:   History of stroke   Hyperglycemia due to diabetes mellitus ([place])   Chronic diastolic CHF (congestive heart failure) (HCC)   End stage renal disease on dialysis [facility])   Essential hypertension   Hypoalbuminemia due to protein-calorie malnutrition ([place])   Mixed hyperlipidemia   New onset a-fib ([place])   Discharge Condition: Stable and improved.  Discharged home with regular follow-up with PCP in 10 days.  CODE STATUS: Full code.  Diet recommendation: Heart healthy modified carbohydrate diet  Filed Weights   01/15/21 1458 01/17/21 1006 01/17/21 1406  Weight: 71.5 kg 72.5 kg 71 kg    History of present illness:  As per H&P written by Dr. Josephine Cables on 01/15/2021 Kelli [name] is a 70 y.o. female with medical history significant for ESRD on HD (TTS), prior cryptogenic stroke, type II DM, chronic diastolic CHF (ECHO 03/8561 EF 60-65% with G1 DD), essential hypertension, hyperlipidemia and anxiety who presents to the emergency department due to 1 week onset of cough with production of clear sputum, congestion and shortness of breath, she went to her PCP and was prescribed with Augmentin, doxycycline and cough medicine, outpatients chest x-ray was not done, she was asked to follow-up with her PCP in  several days if she continues to feel better and to go to ER if her symptoms worsen.  Patient presents to the ED due to worsening fatigue and subjective fever.   ED Course:  In the emergency department, she was intermittently tachypneic, BP was 176/69, O2 sat was 91-96% on room air.  Work-up in the ED showed normal CBC except for leukocytosis, BMP showed hyperglycemia and BUN/creatinine 41/5.48 with an estimated GFR of 8.  Influenza A, B, SARS coronavirus 2 was negative. Chest x-ray showed small opacity left costophrenic angle area, which could be due to atelectasis, small pneumonia or minimal pleural effusion Patient was started on IV ceftriaxone and azithromycin.  Hospitalist was asked to admit patient for further evaluation and management.  Hospital Course:  #1 community-acquired pneumonia, POA -Patient presented with symptoms concerning for pneumonia with cough, sputum production, shortness of breath. -Chest x-ray done concerning for atelectasis versus pneumonia. -Urine Legionella antigen and strep pneumo negative. -Continue Mucinex, as needed antitussives medications and complete oral antibiotic therapy. -Patient now complaining of shortness of breath and with good oxygen saturation at discharge. -Loratadine, Flonase and as needed bronchodilators provided at discharge. -Encouraged to use flutter valve and incentive spirometry.  2.  Well-controlled type 2 diabetes mellitus with hyperglycemia -Last hemoglobin A1c 6.0 07/03/2020. -Repeat hemoglobin A1c 5.0 (01/15/2021). -Continue modified carbohydrate diet. -Continue to follow CBGs and/A1c as an outpatient.  3.  New onset A. fib -EKG noted with A. fib rate controlled. -On review of chart patient noted to be in A. fib 06/2020 however also remained rate controlled at that time. -Patient last seen by cardiology 11/2019. -Patient with prior history of cryptogenic CVA, status post loop recorder and being  followed by cardiology in the outpatient  setting on Plavix. -2D echo from 09/12/2019 with severely dilated left atrium, EF of 60 to 65%,NWMA. -Patient with CHA2DS2-VASc 2 score 7. -TSH 1.138.   -Currently rate controlled on Toprol-XL which will be continue. -Consulted cardiology service with recommendations to continue toprol and to use eliquis for secondary prevention.   4.  Chronic diastolic CHF -Stable and overall compensated. -Continue Toprol-XL, Lipitor. -Volume management per hemodialysis service.  5.  End-stage renal disease on HD ( T/TH/SAT) -Appreciate nephrology service recommendation and assistance -Last hemodialysis well-tolerated 01/17/2021 -Based on outpatient hemodialysis schedule.  6.  Hypertension -Continue the use of Toprol -Heart healthy diet/low-sodium diet recommended.  7.  Hyperlipidemia -Continue statin. -Heart healthy diet encouraged.  8.  History of CVA -Was on Lipitor and Plavix. -Plavix now discontinued and patient started on Eliquis -Patient noted to be in new onset A. fib last noted per EKG  -Outpatient follow-up recommended..  9.  Mild protein calorie malnutrition -Patient advised to maintain adequate oral nutrition -Nutritional supplementation using Nepro recommended.  Procedures: See below for x-ray reports 2D echo: 1. Left ventricular ejection fraction, by estimation, is 70 to 75%. The  left ventricle has hyperdynamic function. The left ventricle has no  regional wall motion abnormalities. There is moderate concentric left  ventricular hypertrophy. Left ventricular  diastolic parameters are indeterminate.   2. Right ventricular systolic function is normal. The right ventricular  size is normal. There is normal pulmonary artery systolic pressure. The  estimated right ventricular systolic pressure is 16.1 mmHg.   3. Left atrial size was severely dilated.   4. The mitral valve is abnormal. Trivial mitral valve regurgitation.  Moderate mitral annular calcification.   5. The aortic  valve is tricuspid. Aortic valve regurgitation is not  visualized. Aortic valve sclerosis/calcification is present, without any  evidence of aortic stenosis. Aortic valve mean gradient measures 6.0 mmHg.   6. The inferior vena cava is normal in size with greater than 50%  respiratory variability, suggesting right atrial pressure of 3 mmHg.  Consultations: Nephrology service  Discharge Exam: Vitals:   01/17/21 1529 01/17/21 1533  BP:  (!) 178/54  Pulse: 97 84  Resp:    Temp:    SpO2: 91%     General: Afebrile, no chest pain, no shortness of breath, good saturation on room air.  Patient reported mild nausea earlier but no vomiting. Cardiovascular: S1 and S2, no rubs, no gallops, no JVD on exam. Respiratory: Improved air movement bilaterally; no wheezing, positive scattered rhonchi.  No using accessory muscle.  Normal respiratory effort. Abdomen: Soft, nontender, positive bowel sounds Extremities: No cyanosis or clubbing.  Discharge Instructions   Discharge Instructions     Diet - low sodium heart healthy   Complete by: As directed    Discharge instructions   Complete by: As directed    Take medications as prescribed Follow heart healthy and modified carbohydrates diet  Arrange follow-up with PCP in 10 days Follow-up with cardiology service as an outpatient (office will contact you with appointment details) Resume outpatient hemodialysis schedule   No wound care   Complete by: As directed       Allergies as of 01/17/2021       Reactions   Hydrochlorothiazide Nausea And Vomiting        Medication List     STOP taking these medications    amLODipine 10 MG tablet Commonly known as: NORVASC   amoxicillin-clavulanate 500-125 MG tablet Commonly known as:  AUGMENTIN   azithromycin 250 MG tablet Commonly known as: ZITHROMAX   bismuth subsalicylate 010 UV/25DG suspension Commonly known as: PEPTO BISMOL   clonazePAM 0.5 MG tablet Commonly known as: KLONOPIN    clopidogrel 75 MG tablet Commonly known as: PLAVIX   doxycycline 100 MG tablet Commonly known as: VIBRA-TABS   methocarbamol 500 MG tablet Commonly known as: ROBAXIN   torsemide 20 MG tablet Commonly known as: DEMADEX       TAKE these medications    acetaminophen 325 MG tablet Commonly known as: TYLENOL Take 2 tablets (650 mg total) by mouth every 6 (six) hours as needed for mild pain (or Fever >/= 101).   albuterol 108 (90 Base) MCG/ACT inhaler Commonly known as: VENTOLIN HFA Inhale 2 puffs into the lungs every 6 (six) hours as needed for wheezing or shortness of breath.   apixaban 5 MG Tabs tablet Commonly known as: ELIQUIS Take 1 tablet (5 mg total) by mouth 2 (two) times daily.   atorvastatin 40 MG tablet Commonly known as: LIPITOR Take 40 mg by mouth every evening.   benzonatate 100 MG capsule Commonly known as: TESSALON Take 100 mg by mouth 3 (three) times daily as needed.   calcium acetate 667 MG capsule Commonly known as: PHOSLO Take 2 capsules (1,334 mg total) by mouth 3 (three) times daily with meals.   cefdinir 300 MG capsule Commonly known as: OMNICEF Take 1 capsule (300 mg total) by mouth 2 (two) times daily for 6 days.   famotidine 20 MG tablet Commonly known as: PEPCID Take 1 tablet (20 mg total) by mouth daily. Start taking on: January 18, 2021   fluticasone 50 MCG/ACT nasal spray Commonly known as: FLONASE Place 2 sprays into both nostrils daily. Start taking on: January 18, 2021   hydrALAZINE 50 MG tablet Commonly known as: APRESOLINE Take 1 tablet (50 mg total) by mouth 2 (two) times daily.   insulin glargine 100 UNIT/ML injection Commonly known as: LANTUS Inject 15 Units into the skin daily. What changed: Another medication with the same name was removed. Continue taking this medication, and follow the directions you see here.   loperamide 2 MG capsule Commonly known as: IMODIUM Take 2 mg by mouth as needed for diarrhea or  loose stools.   loratadine 10 MG tablet Commonly known as: CLARITIN Take 1 tablet (10 mg total) by mouth daily. Start taking on: January 18, 2021   metoprolol succinate 100 MG 24 hr tablet Commonly known as: TOPROL-XL Take 1 tablet (100 mg total) by mouth daily. What changed: when to take this   ondansetron 8 MG disintegrating tablet Commonly known as: Zofran ODT Take 1 tablet (8 mg total) by mouth every 8 (eight) hours as needed for nausea or vomiting.   venlafaxine XR 150 MG 24 hr capsule Commonly known as: EFFEXOR-XR Take 150 mg by mouth daily.       Allergies  Allergen Reactions   Hydrochlorothiazide Nausea And Vomiting    Follow-up Information     [name], [name], MD. Schedule an appointment as soon as possible for a visit in 10 day(s).   Specialty: Family Medicine Contact information: [place] [address] [phone]         Evans Lance, MD .   Specialty: Cardiology Contact information: [place] [address] [phone]                  The results of significant diagnostics from this  hospitalization (including imaging, microbiology, ancillary and laboratory) are listed below for reference.    Significant Diagnostic Studies: DG Chest Portable 1 View  Result Date: 01/14/2021 CLINICAL DATA:  Shortness of breath and chest congestion.  No fever. EXAM: PORTABLE CHEST 1 VIEW COMPARISON:  Portable chest 07/03/2020. FINDINGS: Right IJ dialysis catheter tip is in the upper right atrium. Left chest loop recorder device is again noted as well. A vascular stent in the right axilla is also redemonstrated. There is mild cardiomegaly with normal caliber central vasculature. There is patchy aortic calcific plaque. Small amount of opacity noted in the left costophrenic angle and could indicate atelectasis, small infiltrate or minimal pleural effusion. The remaining lungs are clear.  Generalized osteopenia. IMPRESSION: Small opacity  left costophrenic angle area, which could be due to atelectasis, small pneumonia or minimal pleural effusion. Follow-up PA and lateral chest recommended. Electronically Signed   By: Telford Nab M.D.   On: 01/14/2021 20:06   ECHOCARDIOGRAM COMPLETE  Result Date: 01/16/2021    ECHOCARDIOGRAM REPORT   Patient Name:   Kelli [name] Date of Exam: 01/16/2021 Medical Rec #:  [id number]     Height:       65.0 in Accession #:    [id number]    Weight:       157.6 lb Date of Birth:  05-31-1950      BSA:          1.788 m Patient Age:    70 years      BP:           121/69 mmHg Patient Gender: F             HR:           96 bpm. Exam Location:  [facility] Procedure: 2D Echo, Cardiac Doppler and Color Doppler Indications:    Atrial Fibrillation  History:        Patient has no prior history of Echocardiogram examinations.                 CHF, Previous Myocardial Infarction, Stroke, Arrythmias:Atrial                 Fibrillation, Signs/Symptoms:Chest Pain; Risk Factors:Former                 Smoker, Hypertension, Diabetes and Dyslipidemia.  Sonographer:    Wenda Low Referring Phys: [place]  1. Left ventricular ejection fraction, by estimation, is 70 to 75%. The left ventricle has hyperdynamic function. The left ventricle has no regional wall motion abnormalities. There is moderate concentric left ventricular hypertrophy. Left ventricular diastolic parameters are indeterminate.  2. Right ventricular systolic function is normal. The right ventricular size is normal. There is normal pulmonary artery systolic pressure. The estimated right ventricular systolic pressure is 56.3 mmHg.  3. Left atrial size was severely dilated.  4. The mitral valve is abnormal. Trivial mitral valve regurgitation. Moderate mitral annular calcification.  5. The aortic valve is tricuspid. Aortic valve regurgitation is not visualized. Aortic valve sclerosis/calcification is present, without any evidence of aortic  stenosis. Aortic valve mean gradient measures 6.0 mmHg.  6. The inferior vena cava is normal in size with greater than 50% respiratory variability, suggesting right atrial pressure of 3 mmHg. Comparison(s): Prior images reviewed side by side. LVEF remains normal. FINDINGS  Left Ventricle: Left ventricular ejection fraction, by estimation, is 70 to 75%. The left ventricle has hyperdynamic function. The left ventricle has no regional wall  motion abnormalities. The left ventricular internal cavity size was normal in size. There is moderate concentric left ventricular hypertrophy. Left ventricular diastolic function could not be evaluated due to atrial fibrillation. Left ventricular diastolic parameters are indeterminate. Right Ventricle: The right ventricular size is normal. No increase in right ventricular wall thickness. Right ventricular systolic function is normal. There is normal pulmonary artery systolic pressure. The tricuspid regurgitant velocity is 1.98 m/s, and  with an assumed right atrial pressure of 3 mmHg, the estimated right ventricular systolic pressure is 50.5 mmHg. Left Atrium: Left atrial size was severely dilated. Right Atrium: Right atrial size was normal in size. Pericardium: There is no evidence of pericardial effusion. Presence of epicardial fat layer. Mitral Valve: The mitral valve is abnormal. Moderate mitral annular calcification. Trivial mitral valve regurgitation. MV peak gradient, 14.4 mmHg. The mean mitral valve gradient is 5.0 mmHg. Tricuspid Valve: The tricuspid valve is grossly normal. Tricuspid valve regurgitation is trivial. Aortic Valve: The aortic valve is tricuspid. There is mild aortic valve annular calcification. Aortic valve regurgitation is not visualized. Aortic valve sclerosis/calcification is present, without any evidence of aortic stenosis. Aortic valve mean gradient measures 6.0 mmHg. Aortic valve peak gradient measures 13.0 mmHg. Aortic valve area, by VTI measures 1.85  cm. Pulmonic Valve: The pulmonic valve was grossly normal. Pulmonic valve regurgitation is trivial. Aorta: The aortic root is normal in size and structure. Venous: The inferior vena cava is normal in size with greater than 50% respiratory variability, suggesting right atrial pressure of 3 mmHg. IAS/Shunts: No atrial level shunt detected by color flow Doppler.  LEFT VENTRICLE PLAX 2D LVIDd:         4.17 cm   Diastology LVIDs:         2.30 cm   LV e' medial:    6.57 cm/s LV PW:         1.67 cm   LV E/e' medial:  24.7 LV IVS:        1.43 cm   LV e' lateral:   7.01 cm/s LVOT diam:     1.90 cm   LV E/e' lateral: 23.1 LV SV:         68 LV SV Index:   38 LVOT Area:     2.84 cm  RIGHT VENTRICLE RV Basal diam:  2.65 cm RV Mid diam:    1.80 cm RV S prime:     12.40 cm/s LEFT ATRIUM             Index        RIGHT ATRIUM           Index LA diam:        4.70 cm 2.63 cm/m   RA Area:     10.80 cm LA Vol (A2C):   88.5 ml 49.50 ml/m  RA Volume:   21.50 ml  12.03 ml/m LA Vol (A4C):   97.8 ml 54.70 ml/m LA Biplane Vol: 94.7 ml 52.97 ml/m  AORTIC VALVE                     PULMONIC VALVE AV Area (Vmax):    1.58 cm      PV Vmax:       1.09 m/s AV Area (Vmean):   1.64 cm      PV Peak grad:  4.8 mmHg AV Area (VTI):     1.85 cm AV Vmax:           180.00 cm/s AV Vmean:  118.000 cm/s AV VTI:            0.369 m AV Peak Grad:      13.0 mmHg AV Mean Grad:      6.0 mmHg LVOT Vmax:         100.00 cm/s LVOT Vmean:        68.200 cm/s LVOT VTI:          0.241 m LVOT/AV VTI ratio: 0.65  AORTA Ao Asc diam: 2.80 cm MITRAL VALVE                TRICUSPID VALVE MV Area (PHT): 2.50 cm     TR Peak grad:   15.7 mmHg MV Area VTI:   1.83 cm     TR Vmax:        198.00 cm/s MV Peak grad:  14.4 mmHg MV Mean grad:  5.0 mmHg     SHUNTS MV Vmax:       1.90 m/s     Systemic VTI:  0.24 m MV Vmean:      100.0 cm/s   Systemic Diam: 1.90 cm MV Decel Time: 304 msec MV E velocity: 162.00 cm/s Rozann Lesches MD Electronically signed by Rozann Lesches  MD Signature Date/Time: 01/16/2021/4:50:14 PM    Final     Microbiology: Recent Results (from the past 240 hour(s))  Resp Panel by RT-PCR (Flu A&B, Covid) Nasopharyngeal Swab     Status: None   Collection Time: 01/14/21  7:35 PM   Specimen: Nasopharyngeal Swab; Nasopharyngeal(NP) swabs in vial transport medium  Result Value Ref Range Status   SARS Coronavirus 2 by RT PCR NEGATIVE NEGATIVE Final    Comment: (NOTE) SARS-CoV-2 target nucleic acids are NOT DETECTED.  The SARS-CoV-2 RNA is generally detectable in upper respiratory specimens during the acute phase of infection. The lowest concentration of SARS-CoV-2 viral copies this assay can detect is 138 copies/mL. A negative result does not preclude SARS-Cov-2 infection and should not be used as the sole basis for treatment or other patient management decisions. A negative result may occur with  improper specimen collection/handling, submission of specimen other than nasopharyngeal swab, presence of viral mutation(s) within the areas targeted by this assay, and inadequate number of viral copies(<138 copies/mL). A negative result must be combined with clinical observations, patient history, and epidemiological information. The expected result is Negative.  Fact Sheet for Patients:  EntrepreneurPulse.com.au  Fact Sheet for Healthcare Providers:  IncredibleEmployment.be  This test is no t yet approved or cleared by the [place] FDA and  has been authorized for detection and/or diagnosis of SARS-CoV-2 by FDA under an Emergency Use Authorization (EUA). This EUA will remain  in effect (meaning this test can be used) for the duration of the COVID-19 declaration under Section 564(b)(1) of the Act, 21 U.S.C.section 360bbb-3(b)(1), unless the authorization is terminated  or revoked sooner.       Influenza A by PCR NEGATIVE NEGATIVE Final   Influenza B by PCR NEGATIVE NEGATIVE Final    Comment:  (NOTE) The Xpert Xpress SARS-CoV-2/FLU/RSV plus assay is intended as an aid in the diagnosis of influenza from Nasopharyngeal swab specimens and should not be used as a sole basis for treatment. Nasal washings and aspirates are unacceptable for Xpert Xpress SARS-CoV-2/FLU/RSV testing.  Fact Sheet for Patients: EntrepreneurPulse.com.au  Fact Sheet for Healthcare Providers: IncredibleEmployment.be  This test is not yet approved or cleared by the [place] FDA and has been authorized for detection and/or diagnosis of SARS-CoV-2 by  FDA under an Emergency Use Authorization (EUA). This EUA will remain in effect (meaning this test can be used) for the duration of the COVID-19 declaration under Section 564(b)(1) of the Act, 21 U.S.C. section 360bbb-3(b)(1), unless the authorization is terminated or revoked.  Performed at [facility], [address] [place], [place] [address]   Urine Culture     Status: None   Collection Time: 01/16/21  9:28 AM   Specimen: Urine, Catheterized  Result Value Ref Range Status   Specimen Description   Final    URINE, CATHETERIZED Performed at [facility], [address] [place], [place] [address]    Special Requests   Final    NONE Performed at [facility], [address], [place], [place] [address]    Culture   Final    NO GROWTH Performed at [facility], [place] [address] [place], [place] [address]    Report Status 01/17/2021 FINAL  Final     Labs: Basic Metabolic Panel: Recent Labs  Lab 01/14/21 1949 01/15/21 0404 01/16/21 0601 01/17/21 0717  NA 138 137 138 138  K 3.7 3.5 4.4 4.8  CL 100 101 102 103  CO2 21* 22 24 20*  GLUCOSE 170* 128* 129* 128*  BUN 41* 42* 34* 45*  CREATININE 5.48* 5.70* 4.64* 5.68*  CALCIUM 7.9* 7.7* 8.3* 8.4*  MG  --  1.8  --  2.0  PHOS  --  7.9* 7.3* 7.5*   Liver Function Tests: Recent Labs  Lab 01/14/21 1949 01/15/21 0404 01/16/21 0601 01/17/21 0717  AST 22  15  --   --   ALT 21 19  --   --   ALKPHOS 83 77  --   --   BILITOT 0.4 0.7  --   --   PROT 6.7 6.3*  --   --   ALBUMIN 3.1* 2.9* 2.9* 3.1*   CBC: Recent Labs  Lab 01/14/21 1949 01/15/21 0404 01/16/21 0601 01/17/21 0717  WBC 11.9* 11.2* 10.3 10.0  NEUTROABS 8.0*  --   --   --   HGB 11.2* 10.6* 10.2* 10.3*  HCT 36.5 35.4* 33.9* 36.1  MCV 94.3 95.2 95.0 97.6  PLT 300 296 277 268   CBG: Recent Labs  Lab 01/16/21 0502 01/16/21 1225 01/16/21 1854 01/17/21 0009 01/17/21 0652  GLUCAP 124* 118* 171* 123* 114*   Signed:  Barton Dubois MD.  [facility] 01/17/2021, 3:48 PM

## 2021-01-17 NOTE — Plan of Care (Signed)

## 2021-01-18 LAB — LEGIONELLA PNEUMOPHILA SEROGP 1 UR AG: L. pneumophila Serogp 1 Ur Ag: NEGATIVE

## 2021-01-20 DIAGNOSIS — D631 Anemia in chronic kidney disease: Secondary | ICD-10-CM | POA: Diagnosis not present

## 2021-01-20 DIAGNOSIS — N2581 Secondary hyperparathyroidism of renal origin: Secondary | ICD-10-CM | POA: Diagnosis not present

## 2021-01-20 DIAGNOSIS — N186 End stage renal disease: Secondary | ICD-10-CM | POA: Diagnosis not present

## 2021-01-20 DIAGNOSIS — Z992 Dependence on renal dialysis: Secondary | ICD-10-CM | POA: Diagnosis not present

## 2021-01-20 DIAGNOSIS — D509 Iron deficiency anemia, unspecified: Secondary | ICD-10-CM | POA: Diagnosis not present

## 2021-01-21 DIAGNOSIS — I5030 Unspecified diastolic (congestive) heart failure: Secondary | ICD-10-CM | POA: Diagnosis not present

## 2021-01-21 DIAGNOSIS — E1122 Type 2 diabetes mellitus with diabetic chronic kidney disease: Secondary | ICD-10-CM | POA: Diagnosis not present

## 2021-01-21 DIAGNOSIS — I4891 Unspecified atrial fibrillation: Secondary | ICD-10-CM | POA: Diagnosis not present

## 2021-01-21 DIAGNOSIS — J189 Pneumonia, unspecified organism: Secondary | ICD-10-CM | POA: Diagnosis not present

## 2021-01-21 DIAGNOSIS — Z23 Encounter for immunization: Secondary | ICD-10-CM | POA: Diagnosis not present

## 2021-01-21 DIAGNOSIS — N186 End stage renal disease: Secondary | ICD-10-CM | POA: Diagnosis not present

## 2021-01-23 DIAGNOSIS — D509 Iron deficiency anemia, unspecified: Secondary | ICD-10-CM | POA: Diagnosis not present

## 2021-01-23 DIAGNOSIS — D631 Anemia in chronic kidney disease: Secondary | ICD-10-CM | POA: Diagnosis not present

## 2021-01-23 DIAGNOSIS — Z992 Dependence on renal dialysis: Secondary | ICD-10-CM | POA: Diagnosis not present

## 2021-01-23 DIAGNOSIS — N2581 Secondary hyperparathyroidism of renal origin: Secondary | ICD-10-CM | POA: Diagnosis not present

## 2021-01-23 DIAGNOSIS — N186 End stage renal disease: Secondary | ICD-10-CM | POA: Diagnosis not present

## 2021-01-27 DIAGNOSIS — N2581 Secondary hyperparathyroidism of renal origin: Secondary | ICD-10-CM | POA: Diagnosis not present

## 2021-01-27 DIAGNOSIS — N186 End stage renal disease: Secondary | ICD-10-CM | POA: Diagnosis not present

## 2021-01-27 DIAGNOSIS — D509 Iron deficiency anemia, unspecified: Secondary | ICD-10-CM | POA: Diagnosis not present

## 2021-01-27 DIAGNOSIS — Z992 Dependence on renal dialysis: Secondary | ICD-10-CM | POA: Diagnosis not present

## 2021-01-27 DIAGNOSIS — D631 Anemia in chronic kidney disease: Secondary | ICD-10-CM | POA: Diagnosis not present

## 2021-01-28 DIAGNOSIS — Z992 Dependence on renal dialysis: Secondary | ICD-10-CM | POA: Diagnosis not present

## 2021-01-28 DIAGNOSIS — N186 End stage renal disease: Secondary | ICD-10-CM | POA: Diagnosis not present

## 2021-01-29 DIAGNOSIS — Z992 Dependence on renal dialysis: Secondary | ICD-10-CM | POA: Diagnosis not present

## 2021-01-29 DIAGNOSIS — N250 Renal osteodystrophy: Secondary | ICD-10-CM | POA: Diagnosis not present

## 2021-01-29 DIAGNOSIS — D631 Anemia in chronic kidney disease: Secondary | ICD-10-CM | POA: Diagnosis not present

## 2021-01-29 DIAGNOSIS — D509 Iron deficiency anemia, unspecified: Secondary | ICD-10-CM | POA: Diagnosis not present

## 2021-01-29 DIAGNOSIS — N186 End stage renal disease: Secondary | ICD-10-CM | POA: Diagnosis not present

## 2021-01-31 DIAGNOSIS — N250 Renal osteodystrophy: Secondary | ICD-10-CM | POA: Diagnosis not present

## 2021-01-31 DIAGNOSIS — Z992 Dependence on renal dialysis: Secondary | ICD-10-CM | POA: Diagnosis not present

## 2021-01-31 DIAGNOSIS — D509 Iron deficiency anemia, unspecified: Secondary | ICD-10-CM | POA: Diagnosis not present

## 2021-01-31 DIAGNOSIS — N186 End stage renal disease: Secondary | ICD-10-CM | POA: Diagnosis not present

## 2021-01-31 DIAGNOSIS — D631 Anemia in chronic kidney disease: Secondary | ICD-10-CM | POA: Diagnosis not present

## 2021-02-03 DIAGNOSIS — N250 Renal osteodystrophy: Secondary | ICD-10-CM | POA: Diagnosis not present

## 2021-02-03 DIAGNOSIS — Z992 Dependence on renal dialysis: Secondary | ICD-10-CM | POA: Diagnosis not present

## 2021-02-03 DIAGNOSIS — D631 Anemia in chronic kidney disease: Secondary | ICD-10-CM | POA: Diagnosis not present

## 2021-02-03 DIAGNOSIS — N186 End stage renal disease: Secondary | ICD-10-CM | POA: Diagnosis not present

## 2021-02-03 DIAGNOSIS — D509 Iron deficiency anemia, unspecified: Secondary | ICD-10-CM | POA: Diagnosis not present

## 2021-02-05 DIAGNOSIS — Z992 Dependence on renal dialysis: Secondary | ICD-10-CM | POA: Diagnosis not present

## 2021-02-05 DIAGNOSIS — D509 Iron deficiency anemia, unspecified: Secondary | ICD-10-CM | POA: Diagnosis not present

## 2021-02-05 DIAGNOSIS — D631 Anemia in chronic kidney disease: Secondary | ICD-10-CM | POA: Diagnosis not present

## 2021-02-05 DIAGNOSIS — N186 End stage renal disease: Secondary | ICD-10-CM | POA: Diagnosis not present

## 2021-02-05 DIAGNOSIS — N250 Renal osteodystrophy: Secondary | ICD-10-CM | POA: Diagnosis not present

## 2021-02-07 DIAGNOSIS — D509 Iron deficiency anemia, unspecified: Secondary | ICD-10-CM | POA: Diagnosis not present

## 2021-02-07 DIAGNOSIS — Z992 Dependence on renal dialysis: Secondary | ICD-10-CM | POA: Diagnosis not present

## 2021-02-07 DIAGNOSIS — N186 End stage renal disease: Secondary | ICD-10-CM | POA: Diagnosis not present

## 2021-02-07 DIAGNOSIS — D631 Anemia in chronic kidney disease: Secondary | ICD-10-CM | POA: Diagnosis not present

## 2021-02-07 DIAGNOSIS — N250 Renal osteodystrophy: Secondary | ICD-10-CM | POA: Diagnosis not present

## 2021-02-10 DIAGNOSIS — N186 End stage renal disease: Secondary | ICD-10-CM | POA: Diagnosis not present

## 2021-02-10 DIAGNOSIS — Z992 Dependence on renal dialysis: Secondary | ICD-10-CM | POA: Diagnosis not present

## 2021-02-10 DIAGNOSIS — N250 Renal osteodystrophy: Secondary | ICD-10-CM | POA: Diagnosis not present

## 2021-02-10 DIAGNOSIS — D509 Iron deficiency anemia, unspecified: Secondary | ICD-10-CM | POA: Diagnosis not present

## 2021-02-10 DIAGNOSIS — D631 Anemia in chronic kidney disease: Secondary | ICD-10-CM | POA: Diagnosis not present

## 2021-02-12 DIAGNOSIS — Z992 Dependence on renal dialysis: Secondary | ICD-10-CM | POA: Diagnosis not present

## 2021-02-12 DIAGNOSIS — Z20828 Contact with and (suspected) exposure to other viral communicable diseases: Secondary | ICD-10-CM | POA: Diagnosis not present

## 2021-02-12 DIAGNOSIS — D509 Iron deficiency anemia, unspecified: Secondary | ICD-10-CM | POA: Diagnosis not present

## 2021-02-12 DIAGNOSIS — N186 End stage renal disease: Secondary | ICD-10-CM | POA: Diagnosis not present

## 2021-02-12 DIAGNOSIS — N250 Renal osteodystrophy: Secondary | ICD-10-CM | POA: Diagnosis not present

## 2021-02-12 DIAGNOSIS — D631 Anemia in chronic kidney disease: Secondary | ICD-10-CM | POA: Diagnosis not present

## 2021-02-13 ENCOUNTER — Encounter (HOSPITAL_COMMUNITY): Payer: Self-pay | Admitting: Radiology

## 2021-02-14 DIAGNOSIS — N250 Renal osteodystrophy: Secondary | ICD-10-CM | POA: Diagnosis not present

## 2021-02-14 DIAGNOSIS — D631 Anemia in chronic kidney disease: Secondary | ICD-10-CM | POA: Diagnosis not present

## 2021-02-14 DIAGNOSIS — Z992 Dependence on renal dialysis: Secondary | ICD-10-CM | POA: Diagnosis not present

## 2021-02-14 DIAGNOSIS — D509 Iron deficiency anemia, unspecified: Secondary | ICD-10-CM | POA: Diagnosis not present

## 2021-02-14 DIAGNOSIS — N186 End stage renal disease: Secondary | ICD-10-CM | POA: Diagnosis not present

## 2021-02-17 DIAGNOSIS — E1122 Type 2 diabetes mellitus with diabetic chronic kidney disease: Secondary | ICD-10-CM | POA: Diagnosis not present

## 2021-02-17 DIAGNOSIS — D631 Anemia in chronic kidney disease: Secondary | ICD-10-CM | POA: Diagnosis not present

## 2021-02-17 DIAGNOSIS — J189 Pneumonia, unspecified organism: Secondary | ICD-10-CM | POA: Diagnosis not present

## 2021-02-17 DIAGNOSIS — N186 End stage renal disease: Secondary | ICD-10-CM | POA: Diagnosis not present

## 2021-02-17 DIAGNOSIS — I5030 Unspecified diastolic (congestive) heart failure: Secondary | ICD-10-CM | POA: Diagnosis not present

## 2021-02-17 DIAGNOSIS — D509 Iron deficiency anemia, unspecified: Secondary | ICD-10-CM | POA: Diagnosis not present

## 2021-02-17 DIAGNOSIS — N250 Renal osteodystrophy: Secondary | ICD-10-CM | POA: Diagnosis not present

## 2021-02-17 DIAGNOSIS — Z992 Dependence on renal dialysis: Secondary | ICD-10-CM | POA: Diagnosis not present

## 2021-02-17 DIAGNOSIS — I4891 Unspecified atrial fibrillation: Secondary | ICD-10-CM | POA: Diagnosis not present

## 2021-02-19 DIAGNOSIS — Z992 Dependence on renal dialysis: Secondary | ICD-10-CM | POA: Diagnosis not present

## 2021-02-19 DIAGNOSIS — D631 Anemia in chronic kidney disease: Secondary | ICD-10-CM | POA: Diagnosis not present

## 2021-02-19 DIAGNOSIS — N250 Renal osteodystrophy: Secondary | ICD-10-CM | POA: Diagnosis not present

## 2021-02-19 DIAGNOSIS — N186 End stage renal disease: Secondary | ICD-10-CM | POA: Diagnosis not present

## 2021-02-19 DIAGNOSIS — D509 Iron deficiency anemia, unspecified: Secondary | ICD-10-CM | POA: Diagnosis not present

## 2021-02-21 DIAGNOSIS — N250 Renal osteodystrophy: Secondary | ICD-10-CM | POA: Diagnosis not present

## 2021-02-21 DIAGNOSIS — D509 Iron deficiency anemia, unspecified: Secondary | ICD-10-CM | POA: Diagnosis not present

## 2021-02-21 DIAGNOSIS — Z992 Dependence on renal dialysis: Secondary | ICD-10-CM | POA: Diagnosis not present

## 2021-02-21 DIAGNOSIS — N186 End stage renal disease: Secondary | ICD-10-CM | POA: Diagnosis not present

## 2021-02-21 DIAGNOSIS — D631 Anemia in chronic kidney disease: Secondary | ICD-10-CM | POA: Diagnosis not present

## 2021-02-24 DIAGNOSIS — D631 Anemia in chronic kidney disease: Secondary | ICD-10-CM | POA: Diagnosis not present

## 2021-02-24 DIAGNOSIS — N186 End stage renal disease: Secondary | ICD-10-CM | POA: Diagnosis not present

## 2021-02-24 DIAGNOSIS — N250 Renal osteodystrophy: Secondary | ICD-10-CM | POA: Diagnosis not present

## 2021-02-24 DIAGNOSIS — E119 Type 2 diabetes mellitus without complications: Secondary | ICD-10-CM | POA: Diagnosis not present

## 2021-02-24 DIAGNOSIS — D509 Iron deficiency anemia, unspecified: Secondary | ICD-10-CM | POA: Diagnosis not present

## 2021-02-24 DIAGNOSIS — Z992 Dependence on renal dialysis: Secondary | ICD-10-CM | POA: Diagnosis not present

## 2021-02-26 DIAGNOSIS — N186 End stage renal disease: Secondary | ICD-10-CM | POA: Diagnosis not present

## 2021-02-26 DIAGNOSIS — Z992 Dependence on renal dialysis: Secondary | ICD-10-CM | POA: Diagnosis not present

## 2021-02-26 DIAGNOSIS — D509 Iron deficiency anemia, unspecified: Secondary | ICD-10-CM | POA: Diagnosis not present

## 2021-02-26 DIAGNOSIS — D631 Anemia in chronic kidney disease: Secondary | ICD-10-CM | POA: Diagnosis not present

## 2021-02-26 DIAGNOSIS — N250 Renal osteodystrophy: Secondary | ICD-10-CM | POA: Diagnosis not present

## 2021-02-28 DIAGNOSIS — N186 End stage renal disease: Secondary | ICD-10-CM | POA: Diagnosis not present

## 2021-02-28 DIAGNOSIS — N250 Renal osteodystrophy: Secondary | ICD-10-CM | POA: Diagnosis not present

## 2021-02-28 DIAGNOSIS — D509 Iron deficiency anemia, unspecified: Secondary | ICD-10-CM | POA: Diagnosis not present

## 2021-02-28 DIAGNOSIS — Z992 Dependence on renal dialysis: Secondary | ICD-10-CM | POA: Diagnosis not present

## 2021-02-28 DIAGNOSIS — D631 Anemia in chronic kidney disease: Secondary | ICD-10-CM | POA: Diagnosis not present

## 2021-03-03 DIAGNOSIS — N186 End stage renal disease: Secondary | ICD-10-CM | POA: Diagnosis not present

## 2021-03-03 DIAGNOSIS — Z992 Dependence on renal dialysis: Secondary | ICD-10-CM | POA: Diagnosis not present

## 2021-03-03 DIAGNOSIS — D631 Anemia in chronic kidney disease: Secondary | ICD-10-CM | POA: Diagnosis not present

## 2021-03-03 DIAGNOSIS — N2581 Secondary hyperparathyroidism of renal origin: Secondary | ICD-10-CM | POA: Diagnosis not present

## 2021-03-03 DIAGNOSIS — D509 Iron deficiency anemia, unspecified: Secondary | ICD-10-CM | POA: Diagnosis not present

## 2021-03-03 DIAGNOSIS — N250 Renal osteodystrophy: Secondary | ICD-10-CM | POA: Diagnosis not present

## 2021-03-05 DIAGNOSIS — Z992 Dependence on renal dialysis: Secondary | ICD-10-CM | POA: Diagnosis not present

## 2021-03-05 DIAGNOSIS — N2581 Secondary hyperparathyroidism of renal origin: Secondary | ICD-10-CM | POA: Diagnosis not present

## 2021-03-05 DIAGNOSIS — N186 End stage renal disease: Secondary | ICD-10-CM | POA: Diagnosis not present

## 2021-03-05 DIAGNOSIS — D509 Iron deficiency anemia, unspecified: Secondary | ICD-10-CM | POA: Diagnosis not present

## 2021-03-05 DIAGNOSIS — N250 Renal osteodystrophy: Secondary | ICD-10-CM | POA: Diagnosis not present

## 2021-03-05 DIAGNOSIS — D631 Anemia in chronic kidney disease: Secondary | ICD-10-CM | POA: Diagnosis not present

## 2021-03-07 DIAGNOSIS — N2581 Secondary hyperparathyroidism of renal origin: Secondary | ICD-10-CM | POA: Diagnosis not present

## 2021-03-07 DIAGNOSIS — N250 Renal osteodystrophy: Secondary | ICD-10-CM | POA: Diagnosis not present

## 2021-03-07 DIAGNOSIS — D631 Anemia in chronic kidney disease: Secondary | ICD-10-CM | POA: Diagnosis not present

## 2021-03-07 DIAGNOSIS — N186 End stage renal disease: Secondary | ICD-10-CM | POA: Diagnosis not present

## 2021-03-07 DIAGNOSIS — D509 Iron deficiency anemia, unspecified: Secondary | ICD-10-CM | POA: Diagnosis not present

## 2021-03-07 DIAGNOSIS — Z992 Dependence on renal dialysis: Secondary | ICD-10-CM | POA: Diagnosis not present

## 2021-03-10 DIAGNOSIS — N250 Renal osteodystrophy: Secondary | ICD-10-CM | POA: Diagnosis not present

## 2021-03-10 DIAGNOSIS — Z992 Dependence on renal dialysis: Secondary | ICD-10-CM | POA: Diagnosis not present

## 2021-03-10 DIAGNOSIS — N2581 Secondary hyperparathyroidism of renal origin: Secondary | ICD-10-CM | POA: Diagnosis not present

## 2021-03-10 DIAGNOSIS — N186 End stage renal disease: Secondary | ICD-10-CM | POA: Diagnosis not present

## 2021-03-10 DIAGNOSIS — D509 Iron deficiency anemia, unspecified: Secondary | ICD-10-CM | POA: Diagnosis not present

## 2021-03-10 DIAGNOSIS — D631 Anemia in chronic kidney disease: Secondary | ICD-10-CM | POA: Diagnosis not present

## 2021-03-12 DIAGNOSIS — D509 Iron deficiency anemia, unspecified: Secondary | ICD-10-CM | POA: Diagnosis not present

## 2021-03-12 DIAGNOSIS — N2581 Secondary hyperparathyroidism of renal origin: Secondary | ICD-10-CM | POA: Diagnosis not present

## 2021-03-12 DIAGNOSIS — D631 Anemia in chronic kidney disease: Secondary | ICD-10-CM | POA: Diagnosis not present

## 2021-03-12 DIAGNOSIS — N186 End stage renal disease: Secondary | ICD-10-CM | POA: Diagnosis not present

## 2021-03-12 DIAGNOSIS — Z992 Dependence on renal dialysis: Secondary | ICD-10-CM | POA: Diagnosis not present

## 2021-03-12 DIAGNOSIS — N250 Renal osteodystrophy: Secondary | ICD-10-CM | POA: Diagnosis not present

## 2021-03-14 DIAGNOSIS — Z20828 Contact with and (suspected) exposure to other viral communicable diseases: Secondary | ICD-10-CM | POA: Diagnosis not present

## 2021-03-14 DIAGNOSIS — D509 Iron deficiency anemia, unspecified: Secondary | ICD-10-CM | POA: Diagnosis not present

## 2021-03-14 DIAGNOSIS — N186 End stage renal disease: Secondary | ICD-10-CM | POA: Diagnosis not present

## 2021-03-14 DIAGNOSIS — D631 Anemia in chronic kidney disease: Secondary | ICD-10-CM | POA: Diagnosis not present

## 2021-03-14 DIAGNOSIS — N2581 Secondary hyperparathyroidism of renal origin: Secondary | ICD-10-CM | POA: Diagnosis not present

## 2021-03-14 DIAGNOSIS — Z992 Dependence on renal dialysis: Secondary | ICD-10-CM | POA: Diagnosis not present

## 2021-03-14 DIAGNOSIS — N250 Renal osteodystrophy: Secondary | ICD-10-CM | POA: Diagnosis not present

## 2021-03-17 DIAGNOSIS — N186 End stage renal disease: Secondary | ICD-10-CM | POA: Diagnosis not present

## 2021-03-17 DIAGNOSIS — N250 Renal osteodystrophy: Secondary | ICD-10-CM | POA: Diagnosis not present

## 2021-03-17 DIAGNOSIS — N2581 Secondary hyperparathyroidism of renal origin: Secondary | ICD-10-CM | POA: Diagnosis not present

## 2021-03-17 DIAGNOSIS — D509 Iron deficiency anemia, unspecified: Secondary | ICD-10-CM | POA: Diagnosis not present

## 2021-03-17 DIAGNOSIS — D631 Anemia in chronic kidney disease: Secondary | ICD-10-CM | POA: Diagnosis not present

## 2021-03-17 DIAGNOSIS — Z992 Dependence on renal dialysis: Secondary | ICD-10-CM | POA: Diagnosis not present

## 2021-03-19 DIAGNOSIS — Z992 Dependence on renal dialysis: Secondary | ICD-10-CM | POA: Diagnosis not present

## 2021-03-19 DIAGNOSIS — N250 Renal osteodystrophy: Secondary | ICD-10-CM | POA: Diagnosis not present

## 2021-03-19 DIAGNOSIS — N186 End stage renal disease: Secondary | ICD-10-CM | POA: Diagnosis not present

## 2021-03-19 DIAGNOSIS — D631 Anemia in chronic kidney disease: Secondary | ICD-10-CM | POA: Diagnosis not present

## 2021-03-19 DIAGNOSIS — D509 Iron deficiency anemia, unspecified: Secondary | ICD-10-CM | POA: Diagnosis not present

## 2021-03-19 DIAGNOSIS — N2581 Secondary hyperparathyroidism of renal origin: Secondary | ICD-10-CM | POA: Diagnosis not present

## 2021-03-21 DIAGNOSIS — Z992 Dependence on renal dialysis: Secondary | ICD-10-CM | POA: Diagnosis not present

## 2021-03-21 DIAGNOSIS — D509 Iron deficiency anemia, unspecified: Secondary | ICD-10-CM | POA: Diagnosis not present

## 2021-03-21 DIAGNOSIS — N186 End stage renal disease: Secondary | ICD-10-CM | POA: Diagnosis not present

## 2021-03-21 DIAGNOSIS — N2581 Secondary hyperparathyroidism of renal origin: Secondary | ICD-10-CM | POA: Diagnosis not present

## 2021-03-21 DIAGNOSIS — D631 Anemia in chronic kidney disease: Secondary | ICD-10-CM | POA: Diagnosis not present

## 2021-03-21 DIAGNOSIS — N250 Renal osteodystrophy: Secondary | ICD-10-CM | POA: Diagnosis not present

## 2021-03-24 DIAGNOSIS — N186 End stage renal disease: Secondary | ICD-10-CM | POA: Diagnosis not present

## 2021-03-24 DIAGNOSIS — D509 Iron deficiency anemia, unspecified: Secondary | ICD-10-CM | POA: Diagnosis not present

## 2021-03-24 DIAGNOSIS — Z992 Dependence on renal dialysis: Secondary | ICD-10-CM | POA: Diagnosis not present

## 2021-03-24 DIAGNOSIS — D631 Anemia in chronic kidney disease: Secondary | ICD-10-CM | POA: Diagnosis not present

## 2021-03-24 DIAGNOSIS — N250 Renal osteodystrophy: Secondary | ICD-10-CM | POA: Diagnosis not present

## 2021-03-24 DIAGNOSIS — N2581 Secondary hyperparathyroidism of renal origin: Secondary | ICD-10-CM | POA: Diagnosis not present

## 2021-03-26 DIAGNOSIS — N186 End stage renal disease: Secondary | ICD-10-CM | POA: Diagnosis not present

## 2021-03-26 DIAGNOSIS — N2581 Secondary hyperparathyroidism of renal origin: Secondary | ICD-10-CM | POA: Diagnosis not present

## 2021-03-26 DIAGNOSIS — Z992 Dependence on renal dialysis: Secondary | ICD-10-CM | POA: Diagnosis not present

## 2021-03-26 DIAGNOSIS — D631 Anemia in chronic kidney disease: Secondary | ICD-10-CM | POA: Diagnosis not present

## 2021-03-26 DIAGNOSIS — N250 Renal osteodystrophy: Secondary | ICD-10-CM | POA: Diagnosis not present

## 2021-03-26 DIAGNOSIS — D509 Iron deficiency anemia, unspecified: Secondary | ICD-10-CM | POA: Diagnosis not present

## 2021-03-28 DIAGNOSIS — D631 Anemia in chronic kidney disease: Secondary | ICD-10-CM | POA: Diagnosis not present

## 2021-03-28 DIAGNOSIS — N186 End stage renal disease: Secondary | ICD-10-CM | POA: Diagnosis not present

## 2021-03-28 DIAGNOSIS — N2581 Secondary hyperparathyroidism of renal origin: Secondary | ICD-10-CM | POA: Diagnosis not present

## 2021-03-28 DIAGNOSIS — N250 Renal osteodystrophy: Secondary | ICD-10-CM | POA: Diagnosis not present

## 2021-03-28 DIAGNOSIS — D509 Iron deficiency anemia, unspecified: Secondary | ICD-10-CM | POA: Diagnosis not present

## 2021-03-28 DIAGNOSIS — Z992 Dependence on renal dialysis: Secondary | ICD-10-CM | POA: Diagnosis not present

## 2021-03-31 DIAGNOSIS — N250 Renal osteodystrophy: Secondary | ICD-10-CM | POA: Diagnosis not present

## 2021-03-31 DIAGNOSIS — Z992 Dependence on renal dialysis: Secondary | ICD-10-CM | POA: Diagnosis not present

## 2021-03-31 DIAGNOSIS — N2581 Secondary hyperparathyroidism of renal origin: Secondary | ICD-10-CM | POA: Diagnosis not present

## 2021-03-31 DIAGNOSIS — D631 Anemia in chronic kidney disease: Secondary | ICD-10-CM | POA: Diagnosis not present

## 2021-03-31 DIAGNOSIS — N186 End stage renal disease: Secondary | ICD-10-CM | POA: Diagnosis not present

## 2021-03-31 DIAGNOSIS — D509 Iron deficiency anemia, unspecified: Secondary | ICD-10-CM | POA: Diagnosis not present

## 2021-04-02 DIAGNOSIS — Z992 Dependence on renal dialysis: Secondary | ICD-10-CM | POA: Diagnosis not present

## 2021-04-02 DIAGNOSIS — D631 Anemia in chronic kidney disease: Secondary | ICD-10-CM | POA: Diagnosis not present

## 2021-04-02 DIAGNOSIS — N186 End stage renal disease: Secondary | ICD-10-CM | POA: Diagnosis not present

## 2021-04-02 DIAGNOSIS — D509 Iron deficiency anemia, unspecified: Secondary | ICD-10-CM | POA: Diagnosis not present

## 2021-04-02 DIAGNOSIS — N250 Renal osteodystrophy: Secondary | ICD-10-CM | POA: Diagnosis not present

## 2021-04-03 DIAGNOSIS — J209 Acute bronchitis, unspecified: Secondary | ICD-10-CM | POA: Diagnosis not present

## 2021-04-04 DIAGNOSIS — D509 Iron deficiency anemia, unspecified: Secondary | ICD-10-CM | POA: Diagnosis not present

## 2021-04-04 DIAGNOSIS — D631 Anemia in chronic kidney disease: Secondary | ICD-10-CM | POA: Diagnosis not present

## 2021-04-04 DIAGNOSIS — N250 Renal osteodystrophy: Secondary | ICD-10-CM | POA: Diagnosis not present

## 2021-04-04 DIAGNOSIS — N186 End stage renal disease: Secondary | ICD-10-CM | POA: Diagnosis not present

## 2021-04-04 DIAGNOSIS — Z992 Dependence on renal dialysis: Secondary | ICD-10-CM | POA: Diagnosis not present

## 2021-04-07 DIAGNOSIS — N186 End stage renal disease: Secondary | ICD-10-CM | POA: Diagnosis not present

## 2021-04-07 DIAGNOSIS — D509 Iron deficiency anemia, unspecified: Secondary | ICD-10-CM | POA: Diagnosis not present

## 2021-04-07 DIAGNOSIS — D631 Anemia in chronic kidney disease: Secondary | ICD-10-CM | POA: Diagnosis not present

## 2021-04-07 DIAGNOSIS — Z992 Dependence on renal dialysis: Secondary | ICD-10-CM | POA: Diagnosis not present

## 2021-04-07 DIAGNOSIS — N250 Renal osteodystrophy: Secondary | ICD-10-CM | POA: Diagnosis not present

## 2021-04-09 DIAGNOSIS — N186 End stage renal disease: Secondary | ICD-10-CM | POA: Diagnosis not present

## 2021-04-09 DIAGNOSIS — D509 Iron deficiency anemia, unspecified: Secondary | ICD-10-CM | POA: Diagnosis not present

## 2021-04-09 DIAGNOSIS — D631 Anemia in chronic kidney disease: Secondary | ICD-10-CM | POA: Diagnosis not present

## 2021-04-09 DIAGNOSIS — Z992 Dependence on renal dialysis: Secondary | ICD-10-CM | POA: Diagnosis not present

## 2021-04-09 DIAGNOSIS — N250 Renal osteodystrophy: Secondary | ICD-10-CM | POA: Diagnosis not present

## 2021-04-11 DIAGNOSIS — D631 Anemia in chronic kidney disease: Secondary | ICD-10-CM | POA: Diagnosis not present

## 2021-04-11 DIAGNOSIS — N250 Renal osteodystrophy: Secondary | ICD-10-CM | POA: Diagnosis not present

## 2021-04-11 DIAGNOSIS — D509 Iron deficiency anemia, unspecified: Secondary | ICD-10-CM | POA: Diagnosis not present

## 2021-04-11 DIAGNOSIS — Z992 Dependence on renal dialysis: Secondary | ICD-10-CM | POA: Diagnosis not present

## 2021-04-11 DIAGNOSIS — N186 End stage renal disease: Secondary | ICD-10-CM | POA: Diagnosis not present

## 2021-04-16 DIAGNOSIS — N186 End stage renal disease: Secondary | ICD-10-CM | POA: Diagnosis not present

## 2021-04-16 DIAGNOSIS — Z992 Dependence on renal dialysis: Secondary | ICD-10-CM | POA: Diagnosis not present

## 2021-04-16 DIAGNOSIS — D631 Anemia in chronic kidney disease: Secondary | ICD-10-CM | POA: Diagnosis not present

## 2021-04-16 DIAGNOSIS — D509 Iron deficiency anemia, unspecified: Secondary | ICD-10-CM | POA: Diagnosis not present

## 2021-04-16 DIAGNOSIS — N250 Renal osteodystrophy: Secondary | ICD-10-CM | POA: Diagnosis not present

## 2021-04-18 DIAGNOSIS — N250 Renal osteodystrophy: Secondary | ICD-10-CM | POA: Diagnosis not present

## 2021-04-18 DIAGNOSIS — D509 Iron deficiency anemia, unspecified: Secondary | ICD-10-CM | POA: Diagnosis not present

## 2021-04-18 DIAGNOSIS — D631 Anemia in chronic kidney disease: Secondary | ICD-10-CM | POA: Diagnosis not present

## 2021-04-18 DIAGNOSIS — Z992 Dependence on renal dialysis: Secondary | ICD-10-CM | POA: Diagnosis not present

## 2021-04-18 DIAGNOSIS — N186 End stage renal disease: Secondary | ICD-10-CM | POA: Diagnosis not present

## 2021-04-21 DIAGNOSIS — N250 Renal osteodystrophy: Secondary | ICD-10-CM | POA: Diagnosis not present

## 2021-04-21 DIAGNOSIS — D509 Iron deficiency anemia, unspecified: Secondary | ICD-10-CM | POA: Diagnosis not present

## 2021-04-21 DIAGNOSIS — Z992 Dependence on renal dialysis: Secondary | ICD-10-CM | POA: Diagnosis not present

## 2021-04-21 DIAGNOSIS — N186 End stage renal disease: Secondary | ICD-10-CM | POA: Diagnosis not present

## 2021-04-21 DIAGNOSIS — D631 Anemia in chronic kidney disease: Secondary | ICD-10-CM | POA: Diagnosis not present

## 2021-04-23 DIAGNOSIS — Z992 Dependence on renal dialysis: Secondary | ICD-10-CM | POA: Diagnosis not present

## 2021-04-23 DIAGNOSIS — N250 Renal osteodystrophy: Secondary | ICD-10-CM | POA: Diagnosis not present

## 2021-04-23 DIAGNOSIS — N186 End stage renal disease: Secondary | ICD-10-CM | POA: Diagnosis not present

## 2021-04-23 DIAGNOSIS — D509 Iron deficiency anemia, unspecified: Secondary | ICD-10-CM | POA: Diagnosis not present

## 2021-04-23 DIAGNOSIS — D631 Anemia in chronic kidney disease: Secondary | ICD-10-CM | POA: Diagnosis not present

## 2021-04-25 DIAGNOSIS — D631 Anemia in chronic kidney disease: Secondary | ICD-10-CM | POA: Diagnosis not present

## 2021-04-25 DIAGNOSIS — N250 Renal osteodystrophy: Secondary | ICD-10-CM | POA: Diagnosis not present

## 2021-04-25 DIAGNOSIS — Z992 Dependence on renal dialysis: Secondary | ICD-10-CM | POA: Diagnosis not present

## 2021-04-25 DIAGNOSIS — D509 Iron deficiency anemia, unspecified: Secondary | ICD-10-CM | POA: Diagnosis not present

## 2021-04-25 DIAGNOSIS — N186 End stage renal disease: Secondary | ICD-10-CM | POA: Diagnosis not present

## 2021-04-28 ENCOUNTER — Other Ambulatory Visit: Payer: Self-pay

## 2021-04-28 ENCOUNTER — Encounter (HOSPITAL_COMMUNITY): Payer: Self-pay

## 2021-04-28 ENCOUNTER — Emergency Department (HOSPITAL_COMMUNITY): Payer: Medicare Other

## 2021-04-28 ENCOUNTER — Inpatient Hospital Stay (HOSPITAL_COMMUNITY)
Admission: EM | Admit: 2021-04-28 | Discharge: 2021-05-04 | DRG: 981 | Disposition: A | Discharge status: Home / Self Care | Payer: Medicare Other | Attending: Internal Medicine | Admitting: Internal Medicine

## 2021-04-28 DIAGNOSIS — K219 Gastro-esophageal reflux disease without esophagitis: Secondary | ICD-10-CM | POA: Diagnosis present

## 2021-04-28 DIAGNOSIS — Y712 Prosthetic and other implants, materials and accessory cardiovascular devices associated with adverse incidents: Secondary | ICD-10-CM | POA: Diagnosis present

## 2021-04-28 DIAGNOSIS — T8241XA Breakdown (mechanical) of vascular dialysis catheter, initial encounter: Secondary | ICD-10-CM | POA: Diagnosis not present

## 2021-04-28 DIAGNOSIS — I10 Essential (primary) hypertension: Secondary | ICD-10-CM | POA: Diagnosis not present

## 2021-04-28 DIAGNOSIS — I5033 Acute on chronic diastolic (congestive) heart failure: Secondary | ICD-10-CM | POA: Diagnosis present

## 2021-04-28 DIAGNOSIS — N186 End stage renal disease: Secondary | ICD-10-CM

## 2021-04-28 DIAGNOSIS — Z79899 Other long term (current) drug therapy: Secondary | ICD-10-CM

## 2021-04-28 DIAGNOSIS — E11649 Type 2 diabetes mellitus with hypoglycemia without coma: Secondary | ICD-10-CM | POA: Diagnosis not present

## 2021-04-28 DIAGNOSIS — R059 Cough, unspecified: Secondary | ICD-10-CM | POA: Diagnosis not present

## 2021-04-28 DIAGNOSIS — I120 Hypertensive chronic kidney disease with stage 5 chronic kidney disease or end stage renal disease: Secondary | ICD-10-CM | POA: Diagnosis not present

## 2021-04-28 DIAGNOSIS — F411 Generalized anxiety disorder: Secondary | ICD-10-CM | POA: Diagnosis present

## 2021-04-28 DIAGNOSIS — G9341 Metabolic encephalopathy: Secondary | ICD-10-CM | POA: Diagnosis present

## 2021-04-28 DIAGNOSIS — K2970 Gastritis, unspecified, without bleeding: Secondary | ICD-10-CM | POA: Diagnosis not present

## 2021-04-28 DIAGNOSIS — D509 Iron deficiency anemia, unspecified: Secondary | ICD-10-CM | POA: Diagnosis not present

## 2021-04-28 DIAGNOSIS — F0394 Unspecified dementia, unspecified severity, with anxiety: Secondary | ICD-10-CM | POA: Diagnosis present

## 2021-04-28 DIAGNOSIS — I482 Chronic atrial fibrillation, unspecified: Secondary | ICD-10-CM | POA: Diagnosis not present

## 2021-04-28 DIAGNOSIS — Z743 Need for continuous supervision: Secondary | ICD-10-CM | POA: Diagnosis not present

## 2021-04-28 DIAGNOSIS — R531 Weakness: Secondary | ICD-10-CM | POA: Diagnosis not present

## 2021-04-28 DIAGNOSIS — R0602 Shortness of breath: Secondary | ICD-10-CM | POA: Diagnosis not present

## 2021-04-28 DIAGNOSIS — E782 Mixed hyperlipidemia: Secondary | ICD-10-CM | POA: Diagnosis present

## 2021-04-28 DIAGNOSIS — F0393 Unspecified dementia, unspecified severity, with mood disturbance: Secondary | ICD-10-CM | POA: Diagnosis present

## 2021-04-28 DIAGNOSIS — J9601 Acute respiratory failure with hypoxia: Secondary | ICD-10-CM | POA: Diagnosis not present

## 2021-04-28 DIAGNOSIS — K2990 Gastroduodenitis, unspecified, without bleeding: Secondary | ICD-10-CM | POA: Diagnosis not present

## 2021-04-28 DIAGNOSIS — Z888 Allergy status to other drugs, medicaments and biological substances status: Secondary | ICD-10-CM

## 2021-04-28 DIAGNOSIS — K3189 Other diseases of stomach and duodenum: Secondary | ICD-10-CM | POA: Diagnosis present

## 2021-04-28 DIAGNOSIS — I499 Cardiac arrhythmia, unspecified: Secondary | ICD-10-CM | POA: Diagnosis not present

## 2021-04-28 DIAGNOSIS — J810 Acute pulmonary edema: Secondary | ICD-10-CM | POA: Diagnosis not present

## 2021-04-28 DIAGNOSIS — D631 Anemia in chronic kidney disease: Secondary | ICD-10-CM | POA: Diagnosis present

## 2021-04-28 DIAGNOSIS — N250 Renal osteodystrophy: Secondary | ICD-10-CM | POA: Diagnosis not present

## 2021-04-28 DIAGNOSIS — Z992 Dependence on renal dialysis: Secondary | ICD-10-CM | POA: Diagnosis not present

## 2021-04-28 DIAGNOSIS — Z20822 Contact with and (suspected) exposure to covid-19: Secondary | ICD-10-CM | POA: Diagnosis present

## 2021-04-28 DIAGNOSIS — K319 Disease of stomach and duodenum, unspecified: Secondary | ICD-10-CM | POA: Diagnosis not present

## 2021-04-28 DIAGNOSIS — K31A0 Gastric intestinal metaplasia, unspecified: Secondary | ICD-10-CM | POA: Diagnosis not present

## 2021-04-28 DIAGNOSIS — Z833 Family history of diabetes mellitus: Secondary | ICD-10-CM

## 2021-04-28 DIAGNOSIS — E669 Obesity, unspecified: Secondary | ICD-10-CM | POA: Diagnosis present

## 2021-04-28 DIAGNOSIS — I132 Hypertensive heart and chronic kidney disease with heart failure and with stage 5 chronic kidney disease, or end stage renal disease: Secondary | ICD-10-CM | POA: Diagnosis not present

## 2021-04-28 DIAGNOSIS — R0902 Hypoxemia: Secondary | ICD-10-CM | POA: Diagnosis not present

## 2021-04-28 DIAGNOSIS — Z794 Long term (current) use of insulin: Secondary | ICD-10-CM | POA: Diagnosis not present

## 2021-04-28 DIAGNOSIS — I69354 Hemiplegia and hemiparesis following cerebral infarction affecting left non-dominant side: Secondary | ICD-10-CM | POA: Diagnosis not present

## 2021-04-28 DIAGNOSIS — Z7901 Long term (current) use of anticoagulants: Secondary | ICD-10-CM

## 2021-04-28 DIAGNOSIS — I5032 Chronic diastolic (congestive) heart failure: Secondary | ICD-10-CM | POA: Diagnosis not present

## 2021-04-28 DIAGNOSIS — Z8249 Family history of ischemic heart disease and other diseases of the circulatory system: Secondary | ICD-10-CM

## 2021-04-28 DIAGNOSIS — K449 Diaphragmatic hernia without obstruction or gangrene: Secondary | ICD-10-CM | POA: Diagnosis not present

## 2021-04-28 DIAGNOSIS — J811 Chronic pulmonary edema: Secondary | ICD-10-CM | POA: Diagnosis not present

## 2021-04-28 DIAGNOSIS — Z6831 Body mass index (BMI) 31.0-31.9, adult: Secondary | ICD-10-CM

## 2021-04-28 DIAGNOSIS — E1129 Type 2 diabetes mellitus with other diabetic kidney complication: Secondary | ICD-10-CM | POA: Diagnosis not present

## 2021-04-28 DIAGNOSIS — D649 Anemia, unspecified: Secondary | ICD-10-CM | POA: Diagnosis not present

## 2021-04-28 DIAGNOSIS — R410 Disorientation, unspecified: Secondary | ICD-10-CM | POA: Diagnosis not present

## 2021-04-28 DIAGNOSIS — R404 Transient alteration of awareness: Secondary | ICD-10-CM | POA: Diagnosis not present

## 2021-04-28 DIAGNOSIS — I639 Cerebral infarction, unspecified: Secondary | ICD-10-CM | POA: Diagnosis not present

## 2021-04-28 DIAGNOSIS — T82828A Fibrosis of vascular prosthetic devices, implants and grafts, initial encounter: Secondary | ICD-10-CM | POA: Diagnosis not present

## 2021-04-28 DIAGNOSIS — Z466 Encounter for fitting and adjustment of urinary device: Secondary | ICD-10-CM

## 2021-04-28 DIAGNOSIS — E1122 Type 2 diabetes mellitus with diabetic chronic kidney disease: Secondary | ICD-10-CM | POA: Diagnosis not present

## 2021-04-28 DIAGNOSIS — I110 Hypertensive heart disease with heart failure: Secondary | ICD-10-CM | POA: Diagnosis not present

## 2021-04-28 DIAGNOSIS — M797 Fibromyalgia: Secondary | ICD-10-CM | POA: Diagnosis present

## 2021-04-28 DIAGNOSIS — I509 Heart failure, unspecified: Secondary | ICD-10-CM | POA: Diagnosis not present

## 2021-04-28 DIAGNOSIS — Z87891 Personal history of nicotine dependence: Secondary | ICD-10-CM

## 2021-04-28 DIAGNOSIS — K222 Esophageal obstruction: Secondary | ICD-10-CM | POA: Diagnosis not present

## 2021-04-28 LAB — CBC WITH DIFFERENTIAL/PLATELET
Abs Immature Granulocytes: 0.17 10*3/uL — ABNORMAL HIGH (ref 0.00–0.07)
Basophils Absolute: 0 10*3/uL (ref 0.0–0.1)
Basophils Relative: 0 %
Eosinophils Absolute: 0.2 10*3/uL (ref 0.0–0.5)
Eosinophils Relative: 1 %
HCT: 23.7 % — ABNORMAL LOW (ref 36.0–46.0)
Hemoglobin: 7 g/dL — ABNORMAL LOW (ref 12.0–15.0)
Immature Granulocytes: 1 %
Lymphocytes Relative: 8 %
Lymphs Abs: 1.1 10*3/uL (ref 0.7–4.0)
MCH: 27.8 pg (ref 26.0–34.0)
MCHC: 29.5 g/dL — ABNORMAL LOW (ref 30.0–36.0)
MCV: 94 fL (ref 80.0–100.0)
Monocytes Absolute: 1.5 10*3/uL — ABNORMAL HIGH (ref 0.1–1.0)
Monocytes Relative: 10 %
Neutro Abs: 12.2 10*3/uL — ABNORMAL HIGH (ref 1.7–7.7)
Neutrophils Relative %: 80 %
Platelets: 270 10*3/uL (ref 150–400)
RBC: 2.52 MIL/uL — ABNORMAL LOW (ref 3.87–5.11)
RDW: 18.7 % — ABNORMAL HIGH (ref 11.5–15.5)
WBC: 15.2 10*3/uL — ABNORMAL HIGH (ref 4.0–10.5)
nRBC: 0 % (ref 0.0–0.2)

## 2021-04-28 LAB — RETICULOCYTES
Immature Retic Fract: 15.8 % (ref 2.3–15.9)
RBC.: 2.63 MIL/uL — ABNORMAL LOW (ref 3.87–5.11)
Retic Count, Absolute: 77.1 10*3/uL (ref 19.0–186.0)
Retic Ct Pct: 2.9 % (ref 0.4–3.1)

## 2021-04-28 LAB — IRON AND TIBC
Iron: 43 ug/dL (ref 28–170)
Saturation Ratios: 19 % (ref 10.4–31.8)
TIBC: 225 ug/dL — ABNORMAL LOW (ref 250–450)
UIBC: 182 ug/dL

## 2021-04-28 LAB — BASIC METABOLIC PANEL
Anion gap: 13 (ref 5–15)
BUN: 38 mg/dL — ABNORMAL HIGH (ref 8–23)
CO2: 23 mmol/L (ref 22–32)
Calcium: 6.7 mg/dL — ABNORMAL LOW (ref 8.9–10.3)
Chloride: 103 mmol/L (ref 98–111)
Creatinine, Ser: 5.8 mg/dL — ABNORMAL HIGH (ref 0.44–1.00)
GFR, Estimated: 7 mL/min — ABNORMAL LOW (ref >60)
Glucose, Bld: 144 mg/dL — ABNORMAL HIGH (ref 70–99)
Potassium: 3.9 mmol/L (ref 3.5–5.1)
Sodium: 139 mmol/L (ref 135–145)

## 2021-04-28 LAB — RESP PANEL BY RT-PCR (FLU A&B, COVID) ARPGX2
Influenza A by PCR: NEGATIVE
Influenza B by PCR: NEGATIVE
SARS Coronavirus 2 by RT PCR: NEGATIVE

## 2021-04-28 LAB — FOLATE: Folate: 10.9 ng/mL (ref >5.9)

## 2021-04-28 LAB — VITAMIN B12: Vitamin B-12: 287 pg/mL (ref 180–914)

## 2021-04-28 LAB — FERRITIN: Ferritin: 398 ng/mL — ABNORMAL HIGH (ref 11–307)

## 2021-04-28 MED ORDER — HEPARIN SODIUM (PORCINE) 5000 UNIT/ML IJ SOLN
5000.0000 [IU] | Freq: Three times a day (TID) | INTRAMUSCULAR | Status: DC
  Administered 2021-04-28 – 2021-04-29 (×3): 5000 [IU] via SUBCUTANEOUS
  Filled 2021-04-28 (×3): qty 1

## 2021-04-28 MED ORDER — SODIUM CHLORIDE 0.9 % IV SOLN: 250.0000 mL | INTRAVENOUS | Status: DC | PRN

## 2021-04-28 MED ORDER — VENLAFAXINE HCL ER 75 MG PO CP24
150.0000 mg | ORAL_CAPSULE | Freq: Every day | ORAL | Status: DC
  Administered 2021-04-30 – 2021-05-04 (×4): 150 mg via ORAL
  Filled 2021-04-28 (×5): qty 2

## 2021-04-28 MED ORDER — METOPROLOL SUCCINATE ER 100 MG PO TB24
100.0000 mg | ORAL_TABLET | Freq: Every evening | ORAL | Status: DC
  Administered 2021-04-28 – 2021-05-02 (×4): 100 mg via ORAL
  Filled 2021-04-28 (×2): qty 1
  Filled 2021-04-28: qty 2
  Filled 2021-04-28: qty 1

## 2021-04-28 MED ORDER — ONDANSETRON HCL 4 MG PO TABS: 4.0000 mg | ORAL_TABLET | Freq: Four times a day (QID) | ORAL | Status: DC | PRN

## 2021-04-28 MED ORDER — CHLORHEXIDINE GLUCONATE CLOTH 2 % EX PADS
6.0000 | MEDICATED_PAD | Freq: Every day | CUTANEOUS | Status: DC
  Administered 2021-04-29 – 2021-05-04 (×6): 6 via TOPICAL

## 2021-04-28 MED ORDER — FUROSEMIDE 10 MG/ML IJ SOLN
160.0000 mg | Freq: Once | INTRAMUSCULAR | Status: AC
  Administered 2021-04-28: 160 mg via INTRAVENOUS
  Filled 2021-04-28: qty 16

## 2021-04-28 MED ORDER — ACETAMINOPHEN 650 MG RE SUPP: 650.0000 mg | Freq: Four times a day (QID) | RECTAL | Status: DC | PRN

## 2021-04-28 MED ORDER — SODIUM CHLORIDE 0.9% FLUSH: 3.0000 mL | INTRAVENOUS | Status: DC | PRN

## 2021-04-28 MED ORDER — ATORVASTATIN CALCIUM 40 MG PO TABS
40.0000 mg | ORAL_TABLET | Freq: Every evening | ORAL | Status: DC
  Administered 2021-04-28 – 2021-05-03 (×5): 40 mg via ORAL
  Filled 2021-04-28 (×5): qty 1

## 2021-04-28 MED ORDER — ONDANSETRON HCL 4 MG/2ML IJ SOLN
4.0000 mg | Freq: Four times a day (QID) | INTRAMUSCULAR | Status: DC | PRN
  Administered 2021-04-29: 4 mg via INTRAVENOUS
  Filled 2021-04-28: qty 2

## 2021-04-28 MED ORDER — SODIUM CHLORIDE 0.9% FLUSH
3.0000 mL | Freq: Two times a day (BID) | INTRAVENOUS | Status: DC
  Administered 2021-04-28: 3 mL via INTRAVENOUS

## 2021-04-28 MED ORDER — ACETAMINOPHEN 325 MG PO TABS
650.0000 mg | ORAL_TABLET | Freq: Four times a day (QID) | ORAL | Status: DC | PRN
  Administered 2021-04-30 – 2021-05-04 (×5): 650 mg via ORAL
  Filled 2021-04-28 (×5): qty 2

## 2021-04-28 NOTE — Assessment & Plan Note (Signed)
Lifestyle changes outpatient

## 2021-04-28 NOTE — Progress Notes (Signed)
IR at [facility] consulted for HD catheter exchange. IR can accommodate tomorrow at 0800. Message sent to ordering provider and bedside RN requesting for Care Link to please have patient at the [facility] IR department tomorrow at 0800. Patient will be NPO at midnight in case sedation is needed.   Please call IR with any questions.  Soyla Dryer, [place] [phone] 04/28/2021, 4:21 PM

## 2021-04-28 NOTE — Assessment & Plan Note (Signed)
Plan to hold Eliquis for anticipated catheter exchange Continue metoprolol and monitor on telemetry

## 2021-04-28 NOTE — Assessment & Plan Note (Signed)
Discussed case with nephrology with planned hemodialysis after catheter exchange 3/1

## 2021-04-28 NOTE — Assessment & Plan Note (Signed)
Chest x-ray with pulmonary vascular congestion noted Patient typically does not have any oxygen requirements at home Plan to give 160 mg dose of IV Lasix now as discussed with nephrology and hopefully patient will be able to diurese some May require BiPAP if she does not improve

## 2021-04-28 NOTE — Assessment & Plan Note (Signed)
Continue Effexor.

## 2021-04-28 NOTE — ED Notes (Signed)
Admitting provider at bedside.

## 2021-04-28 NOTE — ED Triage Notes (Signed)
Patient via EMS from dialysis due to hypotension. Dialysis advised patient was hypotensive at 97/77 and dialysis catheter stopped working but were able to remove 581ml of fluid.

## 2021-04-28 NOTE — Assessment & Plan Note (Signed)
General surgery-Dr. Constance Haw planning for catheter exchange in a.m. We will keep n.p.o. after midnight Holding Eliquis

## 2021-04-28 NOTE — Progress Notes (Signed)
[facility]  Patient admitted to hospitalist due to dialysis catheter not working. Has had a partial run today but catheter is clotted.   Had this catheter placed 07/2020 after IR had to replace a catheter and perform a angioplasty of a fibrin sheath/ thrombus. The catheter before that was placed by Vascular Dr. Donzetta Matters 06/2020 due to malfunction.   Given the issues with thrombus and sheath and needing venograms and angioplasty in the past, this is not a catheter I will be able to exchange.   Updated team.   Curlene Labrum, MD [facility] [address] [place], [place] [address] [phone] (office)

## 2021-04-28 NOTE — Assessment & Plan Note (Signed)
[facility]

## 2021-04-28 NOTE — ED Notes (Signed)
ED Provider at bedside. 

## 2021-04-28 NOTE — Progress Notes (Addendum)
° °  NEPHROLOGY NURSING NOTE:   Pt sent to ED from HD clinic post-dialysis for hypotension and evaluation of catheter.  Erlene Quan RN at [facility] reports catheter has been increasingly problematic over the last several sessions--unable to tolerate flows due to excessively negative arterial pressures.  Cathflo has been instilled twice and line reversal/other trouble shooting methods have failed.  Recommend exchange.   Rockwell Alexandria, Rn

## 2021-04-28 NOTE — H&P (Signed)
History and Physical    Patient: Kelli [name] NWG:[id number] DOB: 1951/01/30 DOA: 04/28/2021 DOS: the patient was seen and examined on 04/28/2021 PCP: Manon Hilding, MD  Patient coming from: Home  Chief Complaint:  Chief Complaint  Patient presents with   Shortness of Breath    HPI: Kelli [name] is a 71 y.o. female with medical history significant of ESRD on HD TTS, prior CVA, type 2 diabetes, chronic diastolic CHF, atrial fibrillation on Eliquis, essential hypertension, dyslipidemia, and anxiety who presented to the ED from her hemodialysis facility.  She apparently received approximately 30 minutes of dialysis time with 500 mL of fluid removed after which point in time her catheter experienced a dysfunction.  She was sent to the ED for further evaluation.  At this time she is noted to have ongoing shortness of breath and chest congestion and is requiring nasal cannula oxygen supplementation.  In the ED, she was placed on 4 L nasal cannula oxygen and chest x-ray shows some cardiomegaly and pulmonary vascular congestion.  Leukocytosis of 15,000 noted and hemoglobin 7.  Creatinine 5.8 and BUN 38.  COVID and flu test pending.  EDP has spoken with Dr. Constance Haw who plans to do a catheter exchange tomorrow.  Nephrology has been notified regarding need for hemodialysis once catheter has been exchanged.  Review of Systems: As mentioned in the history of present illness. All other systems reviewed and are negative. Past Medical History:  Diagnosis Date   Anemia    Anxiety    Arthritis    Carotid artery occlusion    Occluded RICA, status post left CEA  August 2014 - Dr. 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   Closed dislocation of left humerus 07/26/2013   Depression    DM (diabetes mellitus), type 2 ([place])    ESRD on hemodialysis ([place])    Essential hypertension    Fibromyalgia     Headache    History of kidney stones    Mixed hyperlipidemia    Multiple gastric ulcers    Pneumonia    Urinary incontinence    Past Surgical History:  Procedure Laterality Date   A/V FISTULAGRAM N/A 06/06/2019   Procedure: A/V FISTULAGRAM;  Surgeon: Marty Heck, MD;  Location: [place] [facility];  Service: Cardiovascular;  Laterality: N/A;   AV FISTULA PLACEMENT Left 04/15/2017   Procedure: ARTERIOVENOUS (AV) FISTULA CREATION LEFT ARM;  Surgeon: Serafina Mitchell, MD;  Location: [facility];  Service: Vascular;  Laterality: Left;   AV FISTULA PLACEMENT Left 05/09/2017   Procedure: INSERTION OF ARTERIOVENOUS (AV) GORE-TEX GRAFT LEFT UPPER ARM;  Surgeon: [name], MD;  Location: [place];  Service: Vascular;  Laterality: Left;   AV FISTULA PLACEMENT Right 09/06/2018   Procedure: INSERTION OF ARTERIOVENOUS (AV) GORE-TEX GRAFT RIGHT  ARM;  Surgeon: [name], MD;  Location: [place];  Service: Vascular;  Laterality: Right;   AV FISTULA PLACEMENT Right 07/03/2020   Procedure: Removal of right upper arm Gortex graft;  Surgeon: Waynetta Sandy, MD;  Location: [place];  Service: Vascular;  Laterality: Right;   [place] Right 08/09/2018   Procedure: BASILIC VEIN TRANSPOSITION 1st Stage;  Surgeon: [name], MD;  Location: [place];  Service: Vascular;  Laterality: Right;   BIOPSY  01/22/2017   Procedure: BIOPSY;  Surgeon: Danie Binder, MD;  [facility];  Service:  Endoscopy;;  gastric   COMBINED HYSTERECTOMY VAGINAL W/ MMK / A&P REPAIR  1981   ENDARTERECTOMY Left 10/06/2012   Procedure: Carotid Endarterectomy with Finesse patch angioplasty;  Surgeon: [name], MD;  Location: [facility] OR;  Service: Vascular;  Laterality: Left;   ESOPHAGOGASTRODUODENOSCOPY N/A 04/26/2014   Procedure: ESOPHAGOGASTRODUODENOSCOPY (EGD);  Surgeon: Lear Ng, MD;  Location: [facility];  Service: Endoscopy;  Laterality: N/A;   ESOPHAGOGASTRODUODENOSCOPY N/A 01/22/2017    Procedure: ESOPHAGOGASTRODUODENOSCOPY (EGD);  Surgeon: Danie Binder, MD;  [facility];  Service: Endoscopy;  Laterality: N/A;   FLEXIBLE SIGMOIDOSCOPY N/A 01/22/2017   Procedure: FLEXIBLE SIGMOIDOSCOPY;  Surgeon: Danie Binder, MD;  [facility];  Service: Endoscopy;  Laterality: N/A;   INSERTION OF DIALYSIS CATHETER Right 07/03/2020   Procedure: INSERTION OF DIALYSIS CATHETER and exchange of right internal jugular dialysis catheter;  Surgeon: Waynetta Sandy, MD;  Location: [place];  Service: Vascular;  Laterality: Right;   IR CV LINE INJECTION  02/04/2017   IR CV LINE INJECTION  04/28/2018   IR CV LINE INJECTION  05/12/2018   IR CV LINE INJECTION  05/18/2018   IR FLUORO GUIDE CV LINE LEFT  01/14/2017   IR FLUORO GUIDE CV LINE LEFT  04/25/2017   IR FLUORO GUIDE CV LINE LEFT  05/13/2017   IR FLUORO GUIDE CV LINE LEFT  03/02/2018   IR FLUORO GUIDE CV LINE LEFT  04/28/2018   IR FLUORO GUIDE CV LINE LEFT  05/12/2018   IR FLUORO GUIDE CV LINE LEFT  05/18/2018   IR FLUORO GUIDE CV LINE LEFT  06/23/2018   IR FLUORO GUIDE CV LINE LEFT  08/02/2018   IR FLUORO GUIDE CV LINE RIGHT  08/22/2020   IR PTA VENOUS EXCEPT DIALYSIS CIRCUIT  04/28/2018   IR PTA VENOUS EXCEPT DIALYSIS CIRCUIT  05/12/2018   IR PTA VENOUS EXCEPT DIALYSIS CIRCUIT  08/22/2020   IR REMOVAL TUN CV CATH W/O FL  07/08/2017   IR REMOVAL TUN CV CATH W/O FL  10/11/2018   IR TRANSCATH RETRIEVAL FB INCL GUIDANCE (MS)  05/12/2018   IR US GUIDE VASC ACCESS LEFT  01/14/2017   LOOP RECORDER IMPLANT  04/16/13   MDT LinQ implanted for cryptogenic stroke   PERIPHERAL VASCULAR INTERVENTION Right 06/06/2019   Procedure: PERIPHERAL VASCULAR INTERVENTION;  Surgeon: Marty Heck, MD;  Location: [place] CV LAB;  Service: Cardiovascular;  Laterality: Right;   [name] WITHOUT CARDIOVERSION N/A 04/16/2013   Procedure: TRANSESOPHAGEAL ECHOCARDIOGRAM ([name]);  Surgeon: Josue Hector, MD;  Location: [place];  Service:  Cardiovascular;  Laterality: N/A;   THROMBECTOMY AND REVISION OF ARTERIOVENTOUS (AV) GORETEX  GRAFT Left 07/24/2017   Procedure: THROMBECTOMY OF LEFT UPPER ARM ARTERIOVENTOUS (AV) GRAFT;  Surgeon: Conrad [place] Oaks, MD;  Location: [place];  Service: Vascular;  Laterality: Left;   URETHRAL DILATION  1980's   [place]   "partial" (10/04/2012)   Social History:  reports that she quit smoking about 18 months ago. Her smoking use included cigarettes. She has a 44.00 pack-year smoking history. She has never used smokeless tobacco. She reports that she does not drink alcohol and does not use drugs.  Allergies  Allergen Reactions   Hydrochlorothiazide Nausea And Vomiting    Family History  Problem Relation Age of Onset   Diabetes Mother    Diabetes Father    Hypertension Sister    Diabetes Brother    Seizures Son    Kidney disease Maternal  Grandmother    Hypertension Maternal Grandmother    Heart disease Maternal Grandfather    Diabetes Paternal Grandmother    Heart disease Paternal Grandfather     Prior to Admission medications   Medication Sig Start Date End Date Taking? Authorizing Provider  acetaminophen (TYLENOL) 325 MG tablet Take 2 tablets (650 mg total) by mouth every 6 (six) hours as needed for mild pain (or Fever >/= 101). 07/05/20  Yes Elgergawy, Silver Huguenin, MD  atorvastatin (LIPITOR) 40 MG tablet Take 40 mg by mouth every evening.  05/17/18  Yes [provider]  clonazePAM (KLONOPIN) 0.5 MG tablet Take 0.5 mg by mouth daily.   Yes [provider]  hydrALAZINE (APRESOLINE) 50 MG tablet Take 1 tablet (50 mg total) by mouth 2 (two) times daily. 01/17/21  Yes Barton Dubois, MD  insulin glargine (LANTUS) 100 UNIT/ML injection Inject 15 Units into the skin daily.   Yes [provider]  metoprolol succinate (TOPROL-XL) 100 MG 24 hr tablet Take 1 tablet (100 mg total) by mouth daily. Patient taking differently: Take 100 mg by mouth every evening.  07/24/17  Yes Conrad Jal, MD  venlafaxine XR (EFFEXOR-XR) 150 MG 24 hr capsule Take 150 mg by mouth daily.    Yes [provider]  albuterol (VENTOLIN HFA) 108 (90 Base) MCG/ACT inhaler Inhale 2 puffs into the lungs every 6 (six) hours as needed for wheezing or shortness of breath. Patient not taking: Reported on 04/28/2021 01/17/21   Barton Dubois, MD  apixaban (ELIQUIS) 5 MG TABS tablet Take 1 tablet (5 mg total) by mouth 2 (two) times daily. Patient not taking: Reported on 04/28/2021 01/17/21   Barton Dubois, MD  benzonatate (TESSALON) 100 MG capsule Take 100 mg by mouth 3 (three) times daily as needed. Patient not taking: Reported on 04/28/2021 01/06/21   [provider]  calcium acetate (PHOSLO) 667 MG capsule Take 2 capsules (1,334 mg total) by mouth 3 (three) times daily with meals. Patient not taking: Reported on 04/28/2021 01/17/21   Barton Dubois, MD  famotidine (PEPCID) 20 MG tablet Take 1 tablet (20 mg total) by mouth daily. Patient not taking: Reported on 04/28/2021 01/18/21   Barton Dubois, MD  fluticasone [facility]) 50 MCG/ACT nasal spray Place 2 sprays into both nostrils daily. Patient not taking: Reported on 04/28/2021 01/18/21   Barton Dubois, MD  loratadine (CLARITIN) 10 MG tablet Take 1 tablet (10 mg total) by mouth daily. Patient not taking: Reported on 04/28/2021 01/18/21   Barton Dubois, MD  ondansetron (ZOFRAN ODT) 8 MG disintegrating tablet Take 1 tablet (8 mg total) by mouth every 8 (eight) hours as needed for nausea or vomiting. Patient not taking: Reported on 04/28/2021 01/17/21   Barton Dubois, MD    Physical Exam: Vitals:   04/28/21 1302 04/28/21 1330 04/28/21 1400 04/28/21 1430  BP:  116/76 105/63 120/66  Pulse:  73 91 86  Resp:  (!) 22 19 17   Temp:      TempSrc:      SpO2: 98% 100% 99% 100%  Weight:      Height:       General exam: Appears congested, but comfortable Respiratory system: Bilateral crackles noted, on nasal cannula oxygen  4L. Cardiovascular system: S1 & S2 heard, RRR.  Gastrointestinal system: Abdomen is soft Central nervous system: Alert and awake Extremities: No edema Skin: No significant lesions noted Psychiatry: Flat affect.  Data Reviewed:  There are no new results to review at this time.  Assessment  and Plan: * Hemodialysis catheter dysfunction ([place])- (present on admission) General surgery-Dr. Constance Haw planning for catheter exchange in a.m. We will keep n.p.o. after midnight Holding Eliquis  Atrial fibrillation, chronic (HCC) Plan to hold Eliquis for anticipated catheter exchange Continue metoprolol and monitor on telemetry  Essential hypertension- (present on admission) Continue metoprolol Hold hydralazine for now with administration of high-dose IV Lasix and monitor carefully  ESRD on hemodialysis [facility]) Discussed case with nephrology with planned hemodialysis after catheter exchange 3/1  Acute respiratory failure with hypoxia ([place])- (present on admission) Chest x-ray with pulmonary vascular congestion noted Patient typically does not have any oxygen requirements at home Plan to give 160 mg dose of IV Lasix now as discussed with nephrology and hopefully patient will be able to diurese some May require BiPAP if she does not improve  Type 2 diabetes mellitus with chronic kidney disease on chronic dialysis, with long-term current use of insulin ([place]) SSI  Anemia- (present on admission) Continue to monitor closely and transfuse for hemoglobin less than 7 May consider transfusion with hemodialysis in a.m.  Generalized anxiety disorder- (present on admission) Continue Effexor   Obesity- (present on admission) Lifestyle changes outpatient    Advance Care Planning: FULL Code  Consults: Nephrology, General Surgery  Family Communication: Husband at bedside  Severity of Illness: The appropriate patient status for this patient is OBSERVATION. Observation status is judged to be  reasonable and necessary in order to provide the required intensity of service to ensure the patient's safety. The patient's presenting symptoms, physical exam findings, and initial radiographic and laboratory data in the context of their medical condition is felt to place them at decreased risk for further clinical deterioration. Furthermore, it is anticipated that the patient will be medically stable for discharge from the hospital within 2 midnights of admission.   Author: Rodena Goldmann, DO 04/28/2021 3:07 PM  For on call review [email or web address].

## 2021-04-28 NOTE — ED Provider Notes (Signed)
[facility] EMERGENCY DEPARTMENT Provider Note   CSN: [id number] Arrival date & time: 04/28/21  1145     History  Chief Complaint  Patient presents with   Shortness of Breath    Kelli [name] is a 71 y.o. female.  HPI     71 year old female comes in with chief complaint of shortness of breath. Patient has history of ESRD on hemodialysis.  Today, she had 30 minutes of dialysis with about 500 cc of fluid out when they had to discontinue her HD because her tunneled cath stopped working.  [name] husband indicates that patient has been having increased shortness of breath since yesterday.  Patient gets her hemodialysis TTS, last full session was on Saturday.  She has also had increased generalized weakness over the last several weeks.  There is no associated fevers, chills, bloody stools, diarrhea, vomiting.   Patient noted to be having audible wheezing but she denies wanting to be placed on BiPAP, as she is still comfortable and breathing.  Home Medications Prior to Admission medications   Medication Sig Start Date End Date Taking? Authorizing Provider  acetaminophen (TYLENOL) 325 MG tablet Take 2 tablets (650 mg total) by mouth every 6 (six) hours as needed for mild pain (or Fever >/= 101). 07/05/20  Yes Elgergawy, Silver Huguenin, MD  atorvastatin (LIPITOR) 40 MG tablet Take 40 mg by mouth every evening.  05/17/18  Yes [provider]  clonazePAM (KLONOPIN) 0.5 MG tablet Take 0.5 mg by mouth daily.   Yes [provider]  hydrALAZINE (APRESOLINE) 50 MG tablet Take 1 tablet (50 mg total) by mouth 2 (two) times daily. 01/17/21  Yes Barton Dubois, MD  insulin glargine (LANTUS) 100 UNIT/ML injection Inject 15 Units into the skin daily.   Yes [provider]  metoprolol succinate (TOPROL-XL) 100 MG 24 hr tablet Take 1 tablet (100 mg total) by mouth daily. Patient taking differently: Take 100 mg by mouth every evening. 07/24/17  Yes Conrad Hi-Nella, MD  venlafaxine XR  (EFFEXOR-XR) 150 MG 24 hr capsule Take 150 mg by mouth daily.    Yes [provider]  albuterol (VENTOLIN HFA) 108 (90 Base) MCG/ACT inhaler Inhale 2 puffs into the lungs every 6 (six) hours as needed for wheezing or shortness of breath. Patient not taking: Reported on 04/28/2021 01/17/21   Barton Dubois, MD  apixaban (ELIQUIS) 5 MG TABS tablet Take 1 tablet (5 mg total) by mouth 2 (two) times daily. Patient not taking: Reported on 04/28/2021 01/17/21   Barton Dubois, MD  benzonatate (TESSALON) 100 MG capsule Take 100 mg by mouth 3 (three) times daily as needed. Patient not taking: Reported on 04/28/2021 01/06/21   [provider]  calcium acetate (PHOSLO) 667 MG capsule Take 2 capsules (1,334 mg total) by mouth 3 (three) times daily with meals. Patient not taking: Reported on 04/28/2021 01/17/21   Barton Dubois, MD  famotidine (PEPCID) 20 MG tablet Take 1 tablet (20 mg total) by mouth daily. Patient not taking: Reported on 04/28/2021 01/18/21   Barton Dubois, MD  fluticasone [facility]) 50 MCG/ACT nasal spray Place 2 sprays into both nostrils daily. Patient not taking: Reported on 04/28/2021 01/18/21   Barton Dubois, MD  loratadine (CLARITIN) 10 MG tablet Take 1 tablet (10 mg total) by mouth daily. Patient not taking: Reported on 04/28/2021 01/18/21   Barton Dubois, MD  ondansetron (ZOFRAN ODT) 8 MG disintegrating tablet Take 1 tablet (8 mg total) by mouth every 8 (eight) hours as needed for  nausea or vomiting. Patient not taking: Reported on 04/28/2021 01/17/21   Barton Dubois, MD      Allergies    Hydrochlorothiazide    Review of Systems   Review of Systems  All other systems reviewed and are negative.  Physical Exam Updated Vital Signs BP 116/74    Pulse 86    Temp 97.9 F (36.6 C) (Oral)    Resp 18    Ht 5\' 5"  (1.651 m)    Wt 85.7 kg    SpO2 97%    BMI 31.45 kg/m  Physical Exam Vitals and nursing note reviewed.  Constitutional:      Appearance: She is  well-developed.  HENT:     Head: Atraumatic.  Cardiovascular:     Rate and Rhythm: Normal rate.  Pulmonary:     Effort: Pulmonary effort is normal.     Breath sounds: Wheezing and rales present.  Musculoskeletal:     Cervical back: Normal range of motion and neck supple.     Right lower leg: Edema present.     Left lower leg: Edema present.  Skin:    General: Skin is warm and dry.  Neurological:     Mental Status: She is alert and oriented to person, place, and time.    ED Results / Procedures / Treatments   Labs (all labs ordered are listed, but only abnormal results are displayed) Labs Reviewed  BASIC METABOLIC PANEL - Abnormal; Notable for the following components:      Result Value   Glucose, Bld 144 (*)    BUN 38 (*)    Creatinine, Ser 5.80 (*)    Calcium 6.7 (*)    GFR, Estimated 7 (*)    All other components within normal limits  CBC WITH DIFFERENTIAL/PLATELET - Abnormal; Notable for the following components:   WBC 15.2 (*)    RBC 2.52 (*)    Hemoglobin 7.0 (*)    HCT 23.7 (*)    MCHC 29.5 (*)    RDW 18.7 (*)    Neutro Abs 12.2 (*)    Monocytes Absolute 1.5 (*)    Abs Immature Granulocytes 0.17 (*)    All other components within normal limits  RETICULOCYTES - Abnormal; Notable for the following components:   RBC. 2.63 (*)    All other components within normal limits  RESP PANEL BY RT-PCR (FLU A&B, COVID) ARPGX2  VITAMIN B12  FOLATE  IRON AND TIBC  FERRITIN    EKG EKG Interpretation  Date/Time:  Tuesday April 28 2021 12:27:50 EST Ventricular Rate:  96 PR Interval:    QRS Duration: 90 QT Interval:  356 QTC Calculation: 450 R Axis:   53 Text Interpretation: Atrial fibrillation Anterior infarct, old Borderline repolarization abnormality AFIB IS NOT NEW Confirmed by Varney Biles [phone]) on 04/28/2021 1:47:07 PM  Radiology DG Chest Port 1 View  Result Date: 04/28/2021 CLINICAL DATA:  Shortness of breath EXAM: PORTABLE CHEST 1 VIEW COMPARISON:   Chest x-ray 01/14/2021 FINDINGS: Cardiomegaly and mediastinum appear unchanged. Calcified plaques in the thoracic aorta. Right-sided central line with the tip in the right atrium. Left-sided atrial loop recorder. Pulmonary vascular prominence. No focal consolidation identified. No definite pleural effusion visualized. No pneumothorax. IMPRESSION: Cardiomegaly with pulmonary vascular congestion. Electronically Signed   By: Ofilia Neas M.D.   On: 04/28/2021 12:58    Procedures Procedures    Medications Ordered in ED Medications  Chlorhexidine Gluconate Cloth 2 % PADS 6 each (has no administration in time range)  furosemide (LASIX) 160 mg in dextrose 5 % 50 mL IVPB (has no administration in time range)    ED Course/ Medical Decision Making/ A&P                           Medical Decision Making Amount and/or Complexity of Data Reviewed Labs: ordered. Radiology: ordered.  Risk Decision regarding hospitalization.   71 year old female comes in with chief complaint of shortness of breath. Patient is noted to be volume overloaded.  Fortunately, there is no respiratory distress or respiratory failure.  It appears that she also received partial HD today. Her catheter is malfunctioning, therefore the hemodialysis was not completed today.  X-ray ordered, visualized independently and it reveals evidence of pulmonary edema.  Low clinical suspicion for pneumonia.  Basic labs ordered.  There is no hyperkalemia on basic metabolic profile.  [name] hemoglobin is 9, which is slowly trended down.  Family denies any bloody stools.  It is possible that her shortness of breath and weakness that family is talking about for the last week could be related to anemia.  Anemia in this case will be likely that of chronic kidney disease.  I discussed the case with Dr. Sabino Dick -general surgery.  She discussed the case with Dr. Constance Haw and the plan is for the patient to stay at [facility], with general  surgery on consult for tunneled cath exchange.  Medicine will admit the patient for her anemia and medical management.  Surgery team is aware that patient might need emergent dialysis catheter placement if her respiratory status declines precipitously.  History provided by patient's husband. I also reviewed patient's previous admission documentation, and reviewed previous labs, specifically the hemoglobin.  Finally, although patient has leukocytosis, there is no clear evidence of infection at this time.  No antibiotics initiated  Final Clinical Impression(s) / ED Diagnoses Final diagnoses:  Acute pulmonary edema ([place])  ESRD on dialysis [facility])  Catheter (urine) change required    Rx / DC Orders ED Discharge Orders     None         Varney Biles, MD 04/28/21 1540

## 2021-04-28 NOTE — Assessment & Plan Note (Signed)
Continue to monitor closely and transfuse for hemoglobin less than 7 May consider transfusion with hemodialysis in a.m.

## 2021-04-28 NOTE — Assessment & Plan Note (Signed)
Continue metoprolol Hold hydralazine for now with administration of high-dose IV Lasix and monitor carefully

## 2021-04-29 ENCOUNTER — Inpatient Hospital Stay (HOSPITAL_COMMUNITY): Payer: Medicare Other

## 2021-04-29 ENCOUNTER — Inpatient Hospital Stay: Payer: Self-pay

## 2021-04-29 ENCOUNTER — Ambulatory Visit (HOSPITAL_COMMUNITY): Payer: Medicare Other

## 2021-04-29 ENCOUNTER — Observation Stay (HOSPITAL_COMMUNITY): Payer: Medicare Other

## 2021-04-29 DIAGNOSIS — F0393 Unspecified dementia, unspecified severity, with mood disturbance: Secondary | ICD-10-CM | POA: Diagnosis present

## 2021-04-29 DIAGNOSIS — Z794 Long term (current) use of insulin: Secondary | ICD-10-CM | POA: Diagnosis not present

## 2021-04-29 DIAGNOSIS — J811 Chronic pulmonary edema: Secondary | ICD-10-CM | POA: Diagnosis not present

## 2021-04-29 DIAGNOSIS — G9341 Metabolic encephalopathy: Secondary | ICD-10-CM | POA: Diagnosis present

## 2021-04-29 DIAGNOSIS — Z7901 Long term (current) use of anticoagulants: Secondary | ICD-10-CM | POA: Diagnosis not present

## 2021-04-29 DIAGNOSIS — I509 Heart failure, unspecified: Secondary | ICD-10-CM | POA: Diagnosis not present

## 2021-04-29 DIAGNOSIS — D509 Iron deficiency anemia, unspecified: Secondary | ICD-10-CM | POA: Diagnosis not present

## 2021-04-29 DIAGNOSIS — I5032 Chronic diastolic (congestive) heart failure: Secondary | ICD-10-CM | POA: Diagnosis not present

## 2021-04-29 DIAGNOSIS — R0602 Shortness of breath: Secondary | ICD-10-CM | POA: Diagnosis present

## 2021-04-29 DIAGNOSIS — Z20822 Contact with and (suspected) exposure to covid-19: Secondary | ICD-10-CM | POA: Diagnosis present

## 2021-04-29 DIAGNOSIS — E11649 Type 2 diabetes mellitus with hypoglycemia without coma: Secondary | ICD-10-CM | POA: Diagnosis not present

## 2021-04-29 DIAGNOSIS — Z992 Dependence on renal dialysis: Secondary | ICD-10-CM | POA: Diagnosis not present

## 2021-04-29 DIAGNOSIS — F0394 Unspecified dementia, unspecified severity, with anxiety: Secondary | ICD-10-CM | POA: Diagnosis present

## 2021-04-29 DIAGNOSIS — I5033 Acute on chronic diastolic (congestive) heart failure: Secondary | ICD-10-CM | POA: Diagnosis present

## 2021-04-29 DIAGNOSIS — K2970 Gastritis, unspecified, without bleeding: Secondary | ICD-10-CM | POA: Diagnosis present

## 2021-04-29 DIAGNOSIS — E1122 Type 2 diabetes mellitus with diabetic chronic kidney disease: Secondary | ICD-10-CM

## 2021-04-29 DIAGNOSIS — Z6831 Body mass index (BMI) 31.0-31.9, adult: Secondary | ICD-10-CM | POA: Diagnosis not present

## 2021-04-29 DIAGNOSIS — T8241XA Breakdown (mechanical) of vascular dialysis catheter, initial encounter: Secondary | ICD-10-CM | POA: Diagnosis present

## 2021-04-29 DIAGNOSIS — I132 Hypertensive heart and chronic kidney disease with heart failure and with stage 5 chronic kidney disease, or end stage renal disease: Secondary | ICD-10-CM | POA: Diagnosis present

## 2021-04-29 DIAGNOSIS — R059 Cough, unspecified: Secondary | ICD-10-CM | POA: Diagnosis not present

## 2021-04-29 DIAGNOSIS — K319 Disease of stomach and duodenum, unspecified: Secondary | ICD-10-CM | POA: Diagnosis not present

## 2021-04-29 DIAGNOSIS — K449 Diaphragmatic hernia without obstruction or gangrene: Secondary | ICD-10-CM | POA: Diagnosis not present

## 2021-04-29 DIAGNOSIS — R410 Disorientation, unspecified: Secondary | ICD-10-CM | POA: Diagnosis not present

## 2021-04-29 DIAGNOSIS — E1129 Type 2 diabetes mellitus with other diabetic kidney complication: Secondary | ICD-10-CM | POA: Diagnosis not present

## 2021-04-29 DIAGNOSIS — K2990 Gastroduodenitis, unspecified, without bleeding: Secondary | ICD-10-CM | POA: Diagnosis not present

## 2021-04-29 DIAGNOSIS — J9601 Acute respiratory failure with hypoxia: Secondary | ICD-10-CM

## 2021-04-29 DIAGNOSIS — D631 Anemia in chronic kidney disease: Secondary | ICD-10-CM | POA: Diagnosis present

## 2021-04-29 DIAGNOSIS — K219 Gastro-esophageal reflux disease without esophagitis: Secondary | ICD-10-CM | POA: Diagnosis present

## 2021-04-29 DIAGNOSIS — I639 Cerebral infarction, unspecified: Secondary | ICD-10-CM | POA: Diagnosis not present

## 2021-04-29 DIAGNOSIS — Y712 Prosthetic and other implants, materials and accessory cardiovascular devices associated with adverse incidents: Secondary | ICD-10-CM | POA: Diagnosis present

## 2021-04-29 DIAGNOSIS — T82828A Fibrosis of vascular prosthetic devices, implants and grafts, initial encounter: Secondary | ICD-10-CM | POA: Diagnosis not present

## 2021-04-29 DIAGNOSIS — I482 Chronic atrial fibrillation, unspecified: Secondary | ICD-10-CM | POA: Diagnosis present

## 2021-04-29 DIAGNOSIS — E782 Mixed hyperlipidemia: Secondary | ICD-10-CM | POA: Diagnosis present

## 2021-04-29 DIAGNOSIS — N186 End stage renal disease: Secondary | ICD-10-CM

## 2021-04-29 DIAGNOSIS — J810 Acute pulmonary edema: Secondary | ICD-10-CM

## 2021-04-29 DIAGNOSIS — D649 Anemia, unspecified: Secondary | ICD-10-CM | POA: Diagnosis present

## 2021-04-29 DIAGNOSIS — F411 Generalized anxiety disorder: Secondary | ICD-10-CM | POA: Diagnosis present

## 2021-04-29 DIAGNOSIS — K31A0 Gastric intestinal metaplasia, unspecified: Secondary | ICD-10-CM | POA: Diagnosis not present

## 2021-04-29 DIAGNOSIS — I110 Hypertensive heart disease with heart failure: Secondary | ICD-10-CM | POA: Diagnosis not present

## 2021-04-29 DIAGNOSIS — K222 Esophageal obstruction: Secondary | ICD-10-CM | POA: Diagnosis not present

## 2021-04-29 DIAGNOSIS — K3189 Other diseases of stomach and duodenum: Secondary | ICD-10-CM | POA: Diagnosis present

## 2021-04-29 DIAGNOSIS — I10 Essential (primary) hypertension: Secondary | ICD-10-CM | POA: Diagnosis not present

## 2021-04-29 DIAGNOSIS — I69354 Hemiplegia and hemiparesis following cerebral infarction affecting left non-dominant side: Secondary | ICD-10-CM | POA: Diagnosis not present

## 2021-04-29 DIAGNOSIS — E669 Obesity, unspecified: Secondary | ICD-10-CM | POA: Diagnosis present

## 2021-04-29 HISTORY — PX: IR VENOCAVAGRAM SVC: IMG679

## 2021-04-29 HISTORY — PX: IR FLUORO GUIDE CV LINE RIGHT: IMG2283

## 2021-04-29 HISTORY — PX: IR INTRAVASCULAR ULTRASOUND NON CORONARY: IMG6085

## 2021-04-29 HISTORY — PX: IR PTA VENOUS EXCEPT DIALYSIS CIRCUIT: IMG6126

## 2021-04-29 LAB — CBC
HCT: 20.3 % — ABNORMAL LOW (ref 36.0–46.0)
HCT: 23.3 % — ABNORMAL LOW (ref 36.0–46.0)
Hemoglobin: 5.9 g/dL — CL (ref 12.0–15.0)
Hemoglobin: 7.3 g/dL — ABNORMAL LOW (ref 12.0–15.0)
MCH: 27.1 pg (ref 26.0–34.0)
MCH: 27.8 pg (ref 26.0–34.0)
MCHC: 29.1 g/dL — ABNORMAL LOW (ref 30.0–36.0)
MCHC: 31.3 g/dL (ref 30.0–36.0)
MCV: 93.1 fL (ref 80.0–100.0)
MCV: 88.6 fL (ref 80.0–100.0)
Platelets: 232 10*3/uL (ref 150–400)
Platelets: 218 10*3/uL (ref 150–400)
RBC: 2.18 MIL/uL — ABNORMAL LOW (ref 3.87–5.11)
RBC: 2.63 MIL/uL — ABNORMAL LOW (ref 3.87–5.11)
RDW: 18.6 % — ABNORMAL HIGH (ref 11.5–15.5)
RDW: 17.4 % — ABNORMAL HIGH (ref 11.5–15.5)
WBC: 9.2 10*3/uL (ref 4.0–10.5)
WBC: 11.9 10*3/uL — ABNORMAL HIGH (ref 4.0–10.5)
nRBC: 0 % (ref 0.0–0.2)
nRBC: 0 % (ref 0.0–0.2)

## 2021-04-29 LAB — PREPARE RBC (CROSSMATCH)

## 2021-04-29 LAB — IRON AND TIBC
Iron: 28 ug/dL (ref 28–170)
Saturation Ratios: 14 % (ref 10.4–31.8)
TIBC: 200 ug/dL — ABNORMAL LOW (ref 250–450)
UIBC: 172 ug/dL

## 2021-04-29 LAB — GLUCOSE, CAPILLARY
Glucose-Capillary: 57 mg/dL — ABNORMAL LOW (ref 70–99)
Glucose-Capillary: 108 mg/dL — ABNORMAL HIGH (ref 70–99)
Glucose-Capillary: 89 mg/dL (ref 70–99)
Glucose-Capillary: 96 mg/dL (ref 70–99)
Glucose-Capillary: 107 mg/dL — ABNORMAL HIGH (ref 70–99)

## 2021-04-29 LAB — HEMOGLOBIN AND HEMATOCRIT, BLOOD
HCT: 26.7 % — ABNORMAL LOW (ref 36.0–46.0)
Hemoglobin: 8.3 g/dL — ABNORMAL LOW (ref 12.0–15.0)

## 2021-04-29 LAB — RETICULOCYTES
Immature Retic Fract: 16.9 % — ABNORMAL HIGH (ref 2.3–15.9)
RBC.: 2.22 MIL/uL — ABNORMAL LOW (ref 3.87–5.11)
Retic Count, Absolute: 67.3 10*3/uL (ref 19.0–186.0)
Retic Ct Pct: 3 % (ref 0.4–3.1)

## 2021-04-29 LAB — BASIC METABOLIC PANEL
Anion gap: 15 (ref 5–15)
BUN: 44 mg/dL — ABNORMAL HIGH (ref 8–23)
CO2: 23 mmol/L (ref 22–32)
Calcium: 6.7 mg/dL — ABNORMAL LOW (ref 8.9–10.3)
Chloride: 99 mmol/L (ref 98–111)
Creatinine, Ser: 6.73 mg/dL — ABNORMAL HIGH (ref 0.44–1.00)
GFR, Estimated: 6 mL/min — ABNORMAL LOW (ref >60)
Glucose, Bld: 72 mg/dL (ref 70–99)
Potassium: 4.2 mmol/L (ref 3.5–5.1)
Sodium: 137 mmol/L (ref 135–145)

## 2021-04-29 LAB — MAGNESIUM: Magnesium: 1.9 mg/dL (ref 1.7–2.4)

## 2021-04-29 LAB — HEMOGLOBIN A1C
Hgb A1c MFr Bld: 4.9 % (ref 4.8–5.6)
Mean Plasma Glucose: 93.93 mg/dL

## 2021-04-29 LAB — VITAMIN B12: Vitamin B-12: 231 pg/mL (ref 180–914)

## 2021-04-29 LAB — FERRITIN: Ferritin: 401 ng/mL — ABNORMAL HIGH (ref 11–307)

## 2021-04-29 LAB — FOLATE: Folate: 8.2 ng/mL (ref >5.9)

## 2021-04-29 MED ORDER — SODIUM CHLORIDE 0.9 % IV SOLN: 100.0000 mL | INTRAVENOUS | Status: DC | PRN

## 2021-04-29 MED ORDER — MIDAZOLAM HCL 2 MG/2ML IJ SOLN
INTRAMUSCULAR | Status: AC | PRN
  Administered 2021-04-29: .5 mg via INTRAVENOUS

## 2021-04-29 MED ORDER — MIDAZOLAM HCL 2 MG/2ML IJ SOLN
INTRAMUSCULAR | Status: AC
  Filled 2021-04-29: qty 2

## 2021-04-29 MED ORDER — HEPARIN SODIUM (PORCINE) 1000 UNIT/ML DIALYSIS
20.0000 [IU]/kg | INTRAMUSCULAR | Status: DC | PRN
  Filled 2021-04-29: qty 2

## 2021-04-29 MED ORDER — CEFAZOLIN SODIUM-DEXTROSE 2-4 GM/100ML-% IV SOLN
INTRAVENOUS | Status: AC
  Filled 2021-04-29: qty 100

## 2021-04-29 MED ORDER — FENTANYL CITRATE (PF) 100 MCG/2ML IJ SOLN
INTRAMUSCULAR | Status: AC | PRN
  Administered 2021-04-29: 12.5 ug via INTRAVENOUS

## 2021-04-29 MED ORDER — HYDRALAZINE HCL 20 MG/ML IJ SOLN
10.0000 mg | Freq: Four times a day (QID) | INTRAMUSCULAR | Status: DC | PRN
Start: 2021-04-29 — End: 2021-05-04
  Administered 2021-05-03 (×2): 10 mg via INTRAVENOUS
  Filled 2021-04-29 (×2): qty 1

## 2021-04-29 MED ORDER — INSULIN ASPART 100 UNIT/ML IJ SOLN
0.0000 [IU] | Freq: Three times a day (TID) | INTRAMUSCULAR | Status: DC
  Administered 2021-05-02: 1 [IU] via SUBCUTANEOUS

## 2021-04-29 MED ORDER — INSULIN ASPART 100 UNIT/ML IJ SOLN: 0.0000 [IU] | INTRAMUSCULAR | Status: DC

## 2021-04-29 MED ORDER — DEXTROSE 50 % IV SOLN: 50.0000 mL | Freq: Once | INTRAVENOUS | Status: DC

## 2021-04-29 MED ORDER — ALTEPLASE 2 MG IJ SOLR: 2.0000 mg | Freq: Once | INTRAMUSCULAR | Status: DC | PRN

## 2021-04-29 MED ORDER — SODIUM CHLORIDE 0.9% IV SOLUTION: Freq: Once | INTRAVENOUS | Status: AC

## 2021-04-29 MED ORDER — ALBUTEROL SULFATE (2.5 MG/3ML) 0.083% IN NEBU
INHALATION_SOLUTION | RESPIRATORY_TRACT | Status: AC
  Administered 2021-04-29: 2.5 mg via RESPIRATORY_TRACT
  Filled 2021-04-29: qty 3

## 2021-04-29 MED ORDER — HEPARIN SODIUM (PORCINE) 1000 UNIT/ML IJ SOLN
INTRAMUSCULAR | Status: AC
  Filled 2021-04-29: qty 2

## 2021-04-29 MED ORDER — DEXTROSE 50 % IV SOLN
INTRAVENOUS | Status: AC
  Administered 2021-04-29: 50 mL
  Filled 2021-04-29: qty 50

## 2021-04-29 MED ORDER — FUROSEMIDE 10 MG/ML IJ SOLN
120.0000 mg | Freq: Once | INTRAVENOUS | Status: AC
  Administered 2021-04-29: 120 mg via INTRAVENOUS
  Filled 2021-04-29 (×3): qty 12

## 2021-04-29 MED ORDER — HEPARIN SODIUM (PORCINE) 1000 UNIT/ML IJ SOLN
INTRAMUSCULAR | Status: AC
  Administered 2021-04-29: 3.8 mL
  Filled 2021-04-29: qty 10

## 2021-04-29 MED ORDER — PANTOPRAZOLE SODIUM 40 MG IV SOLR
40.0000 mg | Freq: Two times a day (BID) | INTRAVENOUS | Status: DC
  Administered 2021-04-29 – 2021-05-03 (×7): 40 mg via INTRAVENOUS
  Filled 2021-04-29 (×8): qty 10

## 2021-04-29 MED ORDER — HEPARIN SODIUM (PORCINE) 1000 UNIT/ML DIALYSIS
1000.0000 [IU] | INTRAMUSCULAR | Status: DC | PRN
  Filled 2021-04-29: qty 1

## 2021-04-29 MED ORDER — ALBUTEROL SULFATE (2.5 MG/3ML) 0.083% IN NEBU
2.5000 mg | INHALATION_SOLUTION | Freq: Once | RESPIRATORY_TRACT | Status: AC
Start: 2021-04-29 — End: 2021-04-29

## 2021-04-29 MED ORDER — LIDOCAINE-EPINEPHRINE 1 %-1:100000 IJ SOLN
INTRAMUSCULAR | Status: AC
  Administered 2021-04-29: 10 mL
  Filled 2021-04-29: qty 1

## 2021-04-29 MED ORDER — PENTAFLUOROPROP-TETRAFLUOROETH EX AERO: 1.0000 "application " | INHALATION_SPRAY | CUTANEOUS | Status: DC | PRN

## 2021-04-29 MED ORDER — FUROSEMIDE 10 MG/ML IJ SOLN: 80.0000 mg | Freq: Once | INTRAMUSCULAR | Status: DC

## 2021-04-29 MED ORDER — CHLORHEXIDINE GLUCONATE CLOTH 2 % EX PADS: 6.0000 | MEDICATED_PAD | Freq: Every day | CUTANEOUS | Status: DC

## 2021-04-29 MED ORDER — CEFAZOLIN SODIUM-DEXTROSE 2-4 GM/100ML-% IV SOLN
2.0000 g | Freq: Once | INTRAVENOUS | Status: AC
  Administered 2021-04-29: 2 g via INTRAVENOUS

## 2021-04-29 MED ORDER — FENTANYL CITRATE (PF) 100 MCG/2ML IJ SOLN
INTRAMUSCULAR | Status: AC
  Filled 2021-04-29: qty 2

## 2021-04-29 MED ORDER — CALCIUM ACETATE (PHOS BINDER) 667 MG PO CAPS
1334.0000 mg | ORAL_CAPSULE | Freq: Three times a day (TID) | ORAL | Status: DC
  Administered 2021-04-30 – 2021-05-03 (×6): 1334 mg via ORAL
  Filled 2021-04-29 (×7): qty 2

## 2021-04-29 MED ORDER — LIDOCAINE HCL (PF) 1 % IJ SOLN: 5.0000 mL | INTRAMUSCULAR | Status: DC | PRN

## 2021-04-29 MED ORDER — LIDOCAINE-PRILOCAINE 2.5-2.5 % EX CREA
1.0000 "application " | TOPICAL_CREAM | CUTANEOUS | Status: DC | PRN
  Filled 2021-04-29: qty 5

## 2021-04-29 MED ORDER — IOHEXOL 300 MG/ML  SOLN
100.0000 mL | Freq: Once | INTRAMUSCULAR | Status: AC | PRN
  Administered 2021-04-29: 20 mL via INTRAVENOUS

## 2021-04-29 NOTE — Consult Note (Signed)
Reason for Consult:Severe anemia. Referring Physician: THR. Kelli [name] is an 71 y.o. female.  HPI: Kelli [name] is a 71 year old white female with multiple medical problems listed be.below who was transferred from [facility] to [facility] for malfunctioning of her [facility] ; the [facility] exchange was done at [facility] radiology by Dr. Serafina Royals today. She was noted to have a hemoglobin of 5.9 gms/dl this morning. There is no history of melena hematochezia, abdominal pain nausea vomiting.  She is on dialysis 3 times a week and is on Eliquis for paroxysmal atrial fibrillation. She received 1 unit of packed red blood cells earlier today.  She had an EGD done by Dr. Lovey Newcomer fields and on 01/22/2017 that showed some gastritis and duodenitis but no definite source of blood loss  was identified; a colonoscopy was also attempted on the same day was but was aborted due to extremely poor prep.  I do not see where the patient had a repeat EGD and colonoscopy since then on my review of the records in Epic.  Past Medical History:  Diagnosis Date   Anemia    Anxiety    Arthritis    Carotid artery occlusion    Occluded RICA, status post left CEA  August 2014 - Dr. 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   Closed dislocation of left humerus 07/26/2013   Depression    DM (diabetes mellitus), type 2 ([place])    ESRD on hemodialysis ([place])    Essential hypertension    Fibromyalgia    Headache    History of kidney stones    Mixed hyperlipidemia    Multiple gastric ulcers    Pneumonia    Urinary incontinence    Past Surgical History:  Procedure Laterality Date   A/V FISTULAGRAM N/A 06/06/2019   Procedure: A/V FISTULAGRAM;  Surgeon: Marty Heck, MD;  Location: [place] [facility];  Service: Cardiovascular;  Laterality: N/A;   AV FISTULA PLACEMENT Left 04/15/2017   Procedure: ARTERIOVENOUS (AV) FISTULA CREATION LEFT  ARM;  Surgeon: Serafina Mitchell, MD;  Location: [place];  Service: Vascular;  Laterality: Left;   AV FISTULA PLACEMENT Left 05/09/2017   Procedure: INSERTION OF ARTERIOVENOUS (AV) GORE-TEX GRAFT LEFT UPPER ARM;  Surgeon: [name], MD;  Location: [place];  Service: Vascular;  Laterality: Left;   AV FISTULA PLACEMENT Right 09/06/2018   Procedure: INSERTION OF ARTERIOVENOUS (AV) GORE-TEX GRAFT RIGHT  ARM;  Surgeon: [name], MD;  Location: [place];  Service: Vascular;  Laterality: Right;   AV FISTULA PLACEMENT Right 07/03/2020   Procedure: Removal of right upper arm Gortex graft;  Surgeon: Waynetta [place], MD;  Location: [place];  Service: Vascular;  Laterality: Right;   [place] Right 08/09/2018   Procedure: BASILIC VEIN TRANSPOSITION 1st Stage;  Surgeon: [name], MD;  Location: [place];  Service: Vascular;  Laterality: Right;   BIOPSY  01/22/2017   Procedure: BIOPSY;  Surgeon: Danie Binder, MD;  [facility];  Service: Endoscopy;;  gastric   COMBINED HYSTERECTOMY VAGINAL W/ MMK / A&P REPAIR  1981   ENDARTERECTOMY Left 10/06/2012   Procedure: Carotid Endarterectomy with Finesse patch angioplasty;  Surgeon: [name], MD;  Location: [facility] OR;  Service: Vascular;  Laterality: Left;   ESOPHAGOGASTRODUODENOSCOPY N/A 04/26/2014   Procedure: ESOPHAGOGASTRODUODENOSCOPY (EGD);  Surgeon: Lear Ng, MD;  Location: [facility]  ENDOSCOPY;  Service: Endoscopy;  Laterality: N/A;   ESOPHAGOGASTRODUODENOSCOPY N/A 01/22/2017   Procedure: ESOPHAGOGASTRODUODENOSCOPY (EGD);  Surgeon: Danie Binder, MD;  [facility];  Service: Endoscopy;  Laterality: N/A;   FLEXIBLE SIGMOIDOSCOPY N/A 01/22/2017   Procedure: FLEXIBLE SIGMOIDOSCOPY;  Surgeon: Danie Binder, MD;  [facility];  Service: Endoscopy;  Laterality: N/A;   INSERTION OF DIALYSIS CATHETER Right 07/03/2020   Procedure: INSERTION OF DIALYSIS CATHETER and exchange of right internal jugular dialysis  catheter;  Surgeon: Waynetta [place], MD;  Location: [place];  Service: Vascular;  Laterality: Right;   IR CV LINE INJECTION  02/04/2017   IR CV LINE INJECTION  04/28/2018   IR CV LINE INJECTION  05/12/2018   IR CV LINE INJECTION  05/18/2018   IR FLUORO GUIDE CV LINE LEFT  01/14/2017   IR FLUORO GUIDE CV LINE LEFT  04/25/2017   IR FLUORO GUIDE CV LINE LEFT  05/13/2017   IR FLUORO GUIDE CV LINE LEFT  03/02/2018   IR FLUORO GUIDE CV LINE LEFT  04/28/2018   IR FLUORO GUIDE CV LINE LEFT  05/12/2018   IR FLUORO GUIDE CV LINE LEFT  05/18/2018   IR FLUORO GUIDE CV LINE LEFT  06/23/2018   IR FLUORO GUIDE CV LINE LEFT  08/02/2018   IR FLUORO GUIDE CV LINE RIGHT  08/22/2020   IR PTA VENOUS EXCEPT DIALYSIS CIRCUIT  04/28/2018   IR PTA VENOUS EXCEPT DIALYSIS CIRCUIT  05/12/2018   IR PTA VENOUS EXCEPT DIALYSIS CIRCUIT  08/22/2020   IR REMOVAL TUN CV CATH W/O FL  07/08/2017   IR REMOVAL TUN CV CATH W/O FL  10/11/2018   IR TRANSCATH RETRIEVAL FB INCL GUIDANCE (MS)  05/12/2018   IR US GUIDE VASC ACCESS LEFT  01/14/2017   LOOP RECORDER IMPLANT  04/16/13   MDT LinQ implanted for cryptogenic stroke   PERIPHERAL VASCULAR INTERVENTION Right 06/06/2019   Procedure: PERIPHERAL VASCULAR INTERVENTION;  Surgeon: Marty Heck, MD;  Location: [place] [facility];  Service: Cardiovascular;  Laterality: Right;   [name] WITHOUT CARDIOVERSION N/A 04/16/2013   Procedure: TRANSESOPHAGEAL ECHOCARDIOGRAM ([name]);  Surgeon: Josue Hector, MD;  Location: [facility] ENDOSCOPY;  Service: Cardiovascular;  Laterality: N/A;   THROMBECTOMY AND REVISION OF ARTERIOVENTOUS (AV) GORETEX  GRAFT Left 07/24/2017   Procedure: THROMBECTOMY OF LEFT UPPER ARM ARTERIOVENTOUS (AV) GRAFT;  Surgeon: Conrad [place], MD;  Location: [place];  Service: Vascular;  Laterality: Left;   URETHRAL DILATION  1980's   VAGINAL HYSTERECTOMY  1981   "partial" (10/04/2012)   Family History  Problem Relation Age of Onset   Diabetes Mother    Diabetes Father    Hypertension Sister     Diabetes Brother    Seizures Son    Kidney disease Maternal Grandmother    Hypertension Maternal Grandmother    Heart disease Maternal Grandfather    Diabetes Paternal Grandmother    Heart disease Paternal Grandfather    Social History:  reports that she quit smoking about 18 months ago. Her smoking use included cigarettes. She has a 44.00 pack-year smoking history. She has never used smokeless tobacco. She reports that she does not drink alcohol and does not use drugs.  Allergies:  Allergies  Allergen Reactions   Hydrochlorothiazide Nausea And Vomiting   Medications: I have reviewed the patient's current medications. Prior to Admission:  Medications Prior to Admission  Medication Sig Dispense Refill Last Dose   acetaminophen (TYLENOL) 325 MG tablet Take 2 tablets (  650 mg total) by mouth every 6 (six) hours as needed for mild pain (or Fever >/= 101).   unk   atorvastatin (LIPITOR) 40 MG tablet Take 40 mg by mouth every evening.    04/27/2021   clonazePAM (KLONOPIN) 0.5 MG tablet Take 0.5 mg by mouth daily.   04/28/2021   hydrALAZINE (APRESOLINE) 50 MG tablet Take 1 tablet (50 mg total) by mouth 2 (two) times daily.   04/28/2021   insulin glargine (LANTUS) 100 UNIT/ML injection Inject 15 Units into the skin daily.   04/28/2021   metoprolol succinate (TOPROL-XL) 100 MG 24 hr tablet Take 1 tablet (100 mg total) by mouth daily. (Patient taking differently: Take 100 mg by mouth every evening.) 30 tablet 0 04/27/2021 at 1800   venlafaxine XR (EFFEXOR-XR) 150 MG 24 hr capsule Take 150 mg by mouth daily.    04/28/2021   albuterol (VENTOLIN HFA) 108 (90 Base) MCG/ACT inhaler Inhale 2 puffs into the lungs every 6 (six) hours as needed for wheezing or shortness of breath. (Patient not taking: Reported on 04/28/2021) 8 g 2 Not Taking   apixaban (ELIQUIS) 5 MG TABS tablet Take 1 tablet (5 mg total) by mouth 2 (two) times daily. (Patient not taking: Reported on 04/28/2021) 60 tablet 2 Not Taking    benzonatate (TESSALON) 100 MG capsule Take 100 mg by mouth 3 (three) times daily as needed. (Patient not taking: Reported on 04/28/2021)   Not Taking   calcium acetate (PHOSLO) 667 MG capsule Take 2 capsules (1,334 mg total) by mouth 3 (three) times daily with meals. (Patient not taking: Reported on 04/28/2021) 180 capsule 2 Not Taking   famotidine (PEPCID) 20 MG tablet Take 1 tablet (20 mg total) by mouth daily. (Patient not taking: Reported on 04/28/2021) 30 tablet 3 Not Taking   fluticasone (FLONASE) 50 MCG/ACT nasal spray Place 2 sprays into both nostrils daily. (Patient not taking: Reported on 04/28/2021) 16 g 1 Not Taking   loratadine (CLARITIN) 10 MG tablet Take 1 tablet (10 mg total) by mouth daily. (Patient not taking: Reported on 04/28/2021) 30 tablet 2 Not Taking   ondansetron (ZOFRAN ODT) 8 MG disintegrating tablet Take 1 tablet (8 mg total) by mouth every 8 (eight) hours as needed for nausea or vomiting. (Patient not taking: Reported on 04/28/2021) 20 tablet 0 Not Taking   Scheduled:  atorvastatin  40 mg Oral QPM   calcium acetate  1,334 mg Oral TID WC   Chlorhexidine Gluconate Cloth  6 each Topical Q0600   dextrose  50 mL Intravenous Once   heparin sodium (porcine)       insulin aspart  0-6 Units Subcutaneous Q4H   lidocaine-EPINEPHrine       metoprolol succinate  100 mg Oral QPM   pantoprazole (PROTONIX) IV  40 mg Intravenous Q12H   venlafaxine XR  150 mg Oral Daily   Continuous:  ceFAZolin      ceFAZolin (ANCEF) IV 2 g (04/29/21 1332)   furosemide 120 mg (04/29/21 1330)   Results for orders placed or performed during the hospital encounter of 04/28/21 (from the past 48 hour(s))  Basic metabolic panel     Status: Abnormal   Collection Time: 04/28/21  1:03 PM  Result Value Ref Range   Sodium 139 135 - 145 mmol/L   Potassium 3.9 3.5 - 5.1 mmol/L   Chloride 103 98 - 111 mmol/L   CO2 23 22 - 32 mmol/L   Glucose, Bld 144 (H) 70 - 99 mg/dL  Comment: Glucose reference range  applies only to samples taken after fasting for at least 8 hours.   BUN 38 (H) 8 - 23 mg/dL   Creatinine, Ser 5.80 (H) 0.44 - 1.00 mg/dL   Calcium 6.7 (L) 8.9 - 10.3 mg/dL   GFR, Estimated 7 (L) >60 mL/min    Comment: (NOTE) Calculated using the CKD-EPI Creatinine Equation (2021)    Anion gap 13 5 - 15    Comment: Performed at [facility], [address], [place], [place] [address]  CBC with Differential     Status: Abnormal   Collection Time: 04/28/21  1:03 PM  Result Value Ref Range   WBC 15.2 (H) 4.0 - 10.5 K/uL   RBC 2.52 (L) 3.87 - 5.11 MIL/uL   Hemoglobin 7.0 (L) 12.0 - 15.0 g/dL   HCT 23.7 (L) 36.0 - 46.0 %   MCV 94.0 80.0 - 100.0 fL   MCH 27.8 26.0 - 34.0 pg   MCHC 29.5 (L) 30.0 - 36.0 g/dL   RDW 18.7 (H) 11.5 - 15.5 %   Platelets 270 150 - 400 K/uL   nRBC 0.0 0.0 - 0.2 %   Neutrophils Relative % 80 %   Neutro Abs 12.2 (H) 1.7 - 7.7 K/uL   Lymphocytes Relative 8 %   Lymphs Abs 1.1 0.7 - 4.0 K/uL   Monocytes Relative 10 %   Monocytes Absolute 1.5 (H) 0.1 - 1.0 K/uL   Eosinophils Relative 1 %   Eosinophils Absolute 0.2 0.0 - 0.5 K/uL   Basophils Relative 0 %   Basophils Absolute 0.0 0.0 - 0.1 K/uL   Immature Granulocytes 1 %   Abs Immature Granulocytes 0.17 (H) 0.00 - 0.07 K/uL    Comment: Performed at [facility], [address], [place], [place] [address]  Resp Panel by RT-PCR (Flu A&B, Covid) Nasopharyngeal Swab     Status: None   Collection Time: 04/28/21  1:03 PM   Specimen: Nasopharyngeal Swab; Nasopharyngeal(NP) swabs in vial transport medium  Result Value Ref Range   SARS Coronavirus 2 by RT PCR NEGATIVE NEGATIVE    Comment: (NOTE) SARS-CoV-2 target nucleic acids are NOT DETECTED.  The SARS-CoV-2 RNA is generally detectable in upper respiratory specimens during the acute phase of infection. The lowest concentration of SARS-CoV-2 viral copies this assay can detect is 138 copies/mL. A negative result does not preclude SARS-Cov-2 infection and should not be  used as the sole basis for treatment or other patient management decisions. A negative result may occur with  improper specimen collection/handling, submission of specimen other than nasopharyngeal swab, presence of viral mutation(s) within the areas targeted by this assay, and inadequate number of viral copies(<138 copies/mL). A negative result must be combined with clinical observations, patient history, and epidemiological information. The expected result is Negative.  Fact Sheet for Patients:  EntrepreneurPulse.com.au  Fact Sheet for Healthcare Providers:  IncredibleEmployment.be  This test is no t yet approved or cleared by the [place] FDA and  has been authorized for detection and/or diagnosis of SARS-CoV-2 by FDA under an Emergency Use Authorization (EUA). This EUA will remain  in effect (meaning this test can be used) for the duration of the COVID-19 declaration under Section 564(b)(1) of the Act, 21 U.S.C.section 360bbb-3(b)(1), unless the authorization is terminated  or revoked sooner.       Influenza A by PCR NEGATIVE NEGATIVE   Influenza B by PCR NEGATIVE NEGATIVE    Comment: (NOTE) The Xpert Xpress SARS-CoV-2/FLU/RSV plus assay is intended  as an aid in the diagnosis of influenza from Nasopharyngeal swab specimens and should not be used as a sole basis for treatment. Nasal washings and aspirates are unacceptable for Xpert Xpress SARS-CoV-2/FLU/RSV testing.  Fact Sheet for Patients: EntrepreneurPulse.com.au  Fact Sheet for Healthcare Providers: IncredibleEmployment.be  This test is not yet approved or cleared by the [place] FDA and has been authorized for detection and/or diagnosis of SARS-CoV-2 by FDA under an Emergency Use Authorization (EUA). This EUA will remain in effect (meaning this test can be used) for the duration of the COVID-19 declaration under Section 564(b)(1) of the  Act, 21 U.S.C. section 360bbb-3(b)(1), unless the authorization is terminated or revoked.  Performed at [facility], [address] [place], [place] [address]   Vitamin B12     Status: None   Collection Time: 04/28/21  1:03 PM  Result Value Ref Range   Vitamin B-12 287 180 - 914 pg/mL    Comment: (NOTE) This assay is not validated for testing neonatal or myeloproliferative syndrome specimens for Vitamin B12 levels. Performed at [facility], [address], [place], [place] [address]   Folate     Status: None   Collection Time: 04/28/21  1:03 PM  Result Value Ref Range   Folate 10.9 >5.9 ng/mL    Comment: Performed at [facility], [address], [place], [place] [address]  Iron and TIBC     Status: Abnormal   Collection Time: 04/28/21  1:03 PM  Result Value Ref Range   Iron 43 28 - 170 ug/dL   TIBC 225 (L) 250 - 450 ug/dL   Saturation Ratios 19 10.4 - 31.8 %   UIBC 182 ug/dL    Comment: Performed at [facility], [address] [place], [place] [address]  Ferritin     Status: Abnormal   Collection Time: 04/28/21  1:03 PM  Result Value Ref Range   Ferritin 398 (H) 11 - 307 ng/mL    Comment: Performed at [facility], [address], [place], [place] [address]  Reticulocytes     Status: Abnormal   Collection Time: 04/28/21  1:03 PM  Result Value Ref Range   Retic Ct Pct 2.9 0.4 - 3.1 %   RBC. 2.63 (L) 3.87 - 5.11 MIL/uL   Retic Count, Absolute 77.1 19.0 - 186.0 K/uL   Immature Retic Fract 15.8 2.3 - 15.9 %    Comment: Performed at [facility], [address], [place], [place] [address]  Magnesium     Status: None   Collection Time: 04/29/21  5:55 AM  Result Value Ref Range   Magnesium 1.9 1.7 - 2.4 mg/dL    Comment: Performed at [facility], [address], [place],  [address]  Basic metabolic panel     Status: Abnormal   Collection Time: 04/29/21  5:55 AM  Result Value Ref Range   Sodium 137 135 - 145 mmol/L   Potassium 4.2 3.5 - 5.1 mmol/L   Chloride 99 98 -  111 mmol/L   CO2 23 22 - 32 mmol/L   Glucose, Bld 72 70 - 99 mg/dL    Comment: Glucose reference range applies only to samples taken after fasting for at least 8 hours.   BUN 44 (H) 8 - 23 mg/dL   Creatinine, Ser 6.73 (H) 0.44 - 1.00 mg/dL   Calcium 6.7 (L) 8.9 - 10.3 mg/dL   GFR, Estimated 6 (L) >60 mL/min    Comment: (NOTE) Calculated using the CKD-EPI Creatinine Equation (2021)  Anion gap 15 5 - 15    Comment: Performed at [facility], [address] [place], [place] [address]  CBC     Status: Abnormal   Collection Time: 04/29/21  5:55 AM  Result Value Ref Range   WBC 9.2 4.0 - 10.5 K/uL   RBC 2.18 (L) 3.87 - 5.11 MIL/uL   Hemoglobin 5.9 (LL) 12.0 - 15.0 g/dL    Comment: This critical result has verified and been called to MUSE,T by Lorette Ang on 03 01 2023 at 0659, and has been read back.    HCT 20.3 (L) 36.0 - 46.0 %   MCV 93.1 80.0 - 100.0 fL   MCH 27.1 26.0 - 34.0 pg   MCHC 29.1 (L) 30.0 - 36.0 g/dL   RDW 18.6 (H) 11.5 - 15.5 %   Platelets 232 150 - 400 K/uL   nRBC 0.0 0.0 - 0.2 %    Comment: Performed at [facility], [address], [place], [place] [address]  Reticulocytes     Status: Abnormal   Collection Time: 04/29/21  5:55 AM  Result Value Ref Range   Retic Ct Pct 3.0 0.4 - 3.1 %   RBC. 2.22 (L) 3.87 - 5.11 MIL/uL   Retic Count, Absolute 67.3 19.0 - 186.0 K/uL   Immature Retic Fract 16.9 (H) 2.3 - 15.9 %    Comment: Performed at [facility], [address], [place], [place] [address]  Type and screen [facility]     Status: None (Preliminary result)   Collection Time: 04/29/21  7:27 AM  Result Value Ref Range   ABO/RH(D) A NEG    Antibody Screen NEG    Sample Expiration 05/02/2021,2359    Unit Number [id number]    Blood Component Type RED CELLS,LR    Unit division 00    Status of Unit ISSUED    Transfusion Status OK TO TRANSFUSE    Crossmatch Result      Compatible Performed at [facility], [address] [place], [place] [address]    Prepare RBC (crossmatch)     Status: None   Collection Time: 04/29/21  7:27 AM  Result Value Ref Range   Order Confirmation      ORDER PROCESSED BY BLOOD BANK Performed at [facility], [address], [place], [place] [address]   Glucose, capillary     Status: Abnormal   Collection Time: 04/29/21  9:03 AM  Result Value Ref Range   Glucose-Capillary 57 (L) 70 - 99 mg/dL    Comment: Glucose reference range applies only to samples taken after fasting for at least 8 hours.  Glucose, capillary     Status: None   Collection Time: 04/29/21  1:02 PM  Result Value Ref Range   Glucose-Capillary 89 70 - 99 mg/dL    Comment: Glucose reference range applies only to samples taken after fasting for at least 8 hours.    DG Chest Port 1 View  Result Date: 04/28/2021 CLINICAL DATA:  Shortness of breath EXAM: PORTABLE CHEST 1 VIEW COMPARISON:  Chest x-ray 01/14/2021 FINDINGS: Cardiomegaly and mediastinum appear unchanged. Calcified plaques in the thoracic aorta. Right-sided central line with the tip in the right atrium. Left-sided atrial loop recorder. Pulmonary vascular prominence. No focal consolidation identified. No definite pleural effusion visualized. No pneumothorax. IMPRESSION: Cardiomegaly with pulmonary vascular congestion. Electronically Signed   By: Ofilia Neas M.D.   On: 04/28/2021 12:58    Review of Systems  Constitutional:  Positive for activity change  and fatigue. Negative for appetite change, chills, diaphoresis, fever and unexpected weight change.  HENT: Negative.    Respiratory: Negative.    Cardiovascular: Negative.   Gastrointestinal: Negative.   Endocrine: Negative.   Allergic/Immunologic: Negative.   Neurological:  Positive for weakness. Negative for tremors, seizures, syncope, facial asymmetry, speech difficulty, light-headedness, numbness and headaches.  Hematological: Negative.   Psychiatric/Behavioral: Negative.    Blood pressure 108/85, pulse 98, temperature 98.6 F  (37 C), temperature source Oral, resp. rate 18, height 5\' 5"  (1.651 m), weight 85.7 kg, SpO2 97 %. Physical Exam Constitutional:      General: She is not in acute distress.    Appearance: She is obese. She is ill-appearing. She is not diaphoretic.  HENT:     Head: Normocephalic and atraumatic.     Mouth/Throat:     Pharynx: Oropharynx is clear.  Cardiovascular:     Rate and Rhythm: Rhythm irregular.  Pulmonary:     Effort: Pulmonary effort is normal.     Breath sounds: Normal breath sounds.  Abdominal:     General: Bowel sounds are normal.     Palpations: Abdomen is soft.  Musculoskeletal:     Cervical back: Normal range of motion.  Skin:    General: Skin is warm and dry.     Findings: Ecchymosis present.  Neurological:     Mental Status: She is oriented to person, place, and time.   Assessment/Plan: 1) Acute on chronic anemia-on Eliquis. Will monitor her symptoms closely for now.  Patient will need a GI work-up once her acute symptoms improve 2) GERD/Hiatal History of gastri ulcers on PPI's. 3) Acute on chronic diastolic CHF. 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 5) Hyperlipidemia. 6) GAD/depression.  Juanita Craver 04/29/2021, 1:26 PM

## 2021-04-29 NOTE — Progress Notes (Addendum)
PROGRESS NOTE                                                                                                                                                                                                             Patient Demographics:    Kelli [name], is a 71 y.o. female, DOB - 1950/06/20, JKK:[id number]  Outpatient Primary MD for the patient is [name], [name], MD    LOS - 0  Admit date - 04/28/2021    Chief Complaint  Patient presents with   Shortness of Breath       Brief Narrative (HPI from H&P)   71 year old female with history of ESRD on TTS schedule who lives in [place], [place] type II, chronic diastolic CHF, paroxysmal atrial fibrillation [place] vas 2 score of greater than 3 on Eliquis, essential hypertension, dyslipidemia and anxiety.  Patient presented to [facility], ER yesterday after she had issues with her HD dialysis catheter and she could only have 500 cc of fluid removed on 04/28/2021.  At the time of presentation to [facility], ER she was noted to have a hemoglobin of 7, mild leukocytosis, she was admitted for HD catheter exchange.  From [facility] she was transferred to [facility] for HD catheter exchange on 04/29/2021 she was noted to have a hemoglobin of 5.9 this morning at [facility] and a unit of blood transfusion was started, upon arrival to [facility], IR department she was noted to have 1 unit of packed RBC transfusion running, she also had bilateral crackles and had difficulty laying flat.  I was called to evaluate the patient in the IR department for further   Subjective:    Kelli [name] today has, No headache, No chest pain, No abdominal pain - No Nausea, No new weakness tingling or numbness, no SOB   Assessment  & Plan :    Acute hypoxic respiratory failure due to acute on chronic diastolic CHF from missed dialysis treatment, HD catheter malfunction, worsened by acute on chronic  anemia.  Fortunately patient still has some urine output she will receive 120 mg of IV Lasix, nebulizer treatments and oxygen supplementation, I have informed nephrology to urgently dialyze the patient after the HD catheter is exchanged.  Supportive care with nebulizer treatment and suction to remove any upper airway debris's will be provided.  Patient encouraged to sit up in chair in the daytime use I-S and flutter valve for pulmonary toiletry.  We will continue to monitor closely.  We will also obtain a baseline chest x-ray while she is here, IR nurse Greg informed to request radiology tech to get an x-ray done in the IR department as patient is still physically showing to be in any pain, also note made in the chest x-ray order that patient is physically located in the IR department at [facility].   ESRD.  On TTS schedule.  Now has HD malfunction.  HD catheter will be switched in the IR department, nephrology has been requested to dialyze the patient as soon as possible.  Acute anemia in the presence of anemia of chronic disease in a patient who is on Eliquis.  No history of melena or blood in stool, she is on Eliquis, there is some element of heme dilution however her baseline hemoglobin seems to be close to 10-1/2.  She is getting a unit of packed RBC transfused, will undergo HD, will place her on IV PPI twice daily, hold Eliquis.  We will request GI to evaluate as well.  If H&H continues to drop we will consider EGD and colonoscopy.  Essential hypertension.  On beta-blocker continue and monitor.  Acute on chronic diastolic CHF EF 32% on echocardiogram done in November 2022.  This is due to missed dialysis treatment kindly see plan in #1 above continue beta-blocker for proper diastolic refilling.  Obesity.  BMI 31.  Follow with PCP for weight loss.  Mild metabolic encephalopathy.  Question baseline, likely due to combination of hypoxia, anemia and missed dialysis.  Supportive care.  Minimize narcotics  and benzodiazepines.  DM type II.  Check A1c.  Sliding scale.  CBG 9 AM at [facility] was 31 will give an amp of D50 and monitor.  Have called the IR nurse Marya Amsler personally to inform that patient requires D50 and orders RN.  Lab Results  Component Value Date   HGBA1C 5.0 01/15/2021    CBG (last 3)  Recent Labs    04/29/21 0903  GLUCAP 57*          Condition - Extremely Guarded  Family Communication  :  None present  Code Status :  Full  Consults  :  IR, GI, Renal  PUD Prophylaxis : PPI   Procedures  :     New dialysis catheter to be placed 04/29/2021      Disposition Plan  :    Status is: Observation  DVT Prophylaxis  :    Place and maintain sequential compression device Start: 04/29/21 1224    Lab Results  Component Value Date   PLT 232 04/29/2021    Diet :  Diet Order             Diet NPO time specified  Diet effective midnight                    Inpatient Medications  Scheduled Meds:  albuterol  2.5 mg Nebulization Once   atorvastatin  40 mg Oral QPM   calcium acetate  1,334 mg Oral TID WC   Chlorhexidine Gluconate Cloth  6 each Topical Q0600   insulin aspart  0-6 Units Subcutaneous Q4H   metoprolol succinate  100 mg Oral QPM   pantoprazole (PROTONIX) IV  40 mg Intravenous Q12H   venlafaxine XR  150 mg Oral Daily   Continuous Infusions:  furosemide  PRN Meds:.acetaminophen **OR** acetaminophen, ondansetron **OR** ondansetron (ZOFRAN) IV  Antibiotics  :    Anti-infectives (From admission, onward)    None        Time Spent in minutes  30   Lala Lund M.D on 04/29/2021 at 12:35 PM  To page go to [email or web address]   [facility] -  Office  [phone]  See all Orders from today for further details    Objective:   Vitals:   04/29/21 0845 04/29/21 0912 04/29/21 1026 04/29/21 1114  BP: (!) 179/64 132/80  (!) 107/53  Pulse: 84 80 97 89  Resp: 20 16  (!) 24  Temp: 98.3 F (36.8 C) 97.8 F (36.6 C)  98.2 F  (36.8 C)  TempSrc: Oral Oral  Oral  SpO2: 93% 92% 100% 96%  Weight:      Height:        Wt Readings from Last 3 Encounters:  04/28/21 85.7 kg  01/17/21 71 kg  11/21/20 75.3 kg     Intake/Output Summary (Last 24 hours) at 04/29/2021 1235 Last data filed at 04/29/2021 0849 Gross per 24 hour  Intake 620.53 ml  Output --  Net 620.53 ml     Physical Exam  Awake Alert x 1, No new F.N deficits, Normal affect [place].AT,PERRAL Supple Neck, No JVD,   Symmetrical Chest wall movement, Good air movement bilaterally,  few rales RRR,No Gallops, Rubs or new Murmurs,  +ve B.Sounds, Abd Soft, No tenderness,   No Cyanosis, right tunneled HD catheter site appears clean     RN pressure injury documentation: Pressure Ulcer 04/26/14 Stage II -  Partial thickness loss of dermis presenting as a shallow open ulcer with a red, pink wound bed without slough. pt states this comes from wears a pad in her depends at home (Active)  04/26/14 1503  Location: Sacrum  Location Orientation: Mid  Staging: Stage II -  Partial thickness loss of dermis presenting as a shallow open ulcer with a red, pink wound bed without slough.  Wound Description (Comments): pt states this comes from wears a pad in her depends at home  Present on Admission: Yes     Pressure Injury 09/11/19 Buttocks Left Stage 2 -  Partial thickness loss of dermis presenting as a shallow open injury with a red, pink wound bed without slough. (Active)  09/11/19 2200  Location: Buttocks  Location Orientation: Left  Staging: Stage 2 -  Partial thickness loss of dermis presenting as a shallow open injury with a red, pink wound bed without slough.  Wound Description (Comments):   Present on Admission: Yes     Pressure Injury 01/15/21 Sacrum Lower;Medial Stage 1 -  Intact skin with non-blanchable redness of a localized area usually over a bony prominence. Stage 1, healed pressure injury to right sacrum (Active)  01/15/21 0631  Location: Sacrum   Location Orientation: Lower;Medial  Staging: Stage 1 -  Intact skin with non-blanchable redness of a localized area usually over a bony prominence.  Wound Description (Comments): Stage 1, healed pressure injury to right sacrum  Present on Admission: Yes     Data Review:    CBC Recent Labs  Lab 04/28/21 1303 04/29/21 0555  WBC 15.2* 9.2  HGB 7.0* 5.9*  HCT 23.7* 20.3*  PLT 270 232  MCV 94.0 93.1  MCH 27.8 27.1  MCHC 29.5* 29.1*  RDW 18.7* 18.6*  LYMPHSABS 1.1  --   MONOABS 1.5*  --   EOSABS 0.2  --   BASOSABS 0.0  --  Electrolytes Recent Labs  Lab 04/28/21 1303 04/29/21 0555  NA 139 137  K 3.9 4.2  CL 103 99  CO2 23 23  GLUCOSE 144* 72  BUN 38* 44*  CREATININE 5.80* 6.73*  CALCIUM 6.7* 6.7*  MG  --  1.9    ------------------------------------------------------------------------------------------------------------------ No results for input(s): CHOL, HDL, LDLCALC, TRIG, CHOLHDL, LDLDIRECT in the last 72 hours.  Lab Results  Component Value Date   HGBA1C 5.0 01/15/2021    No results for input(s): TSH, T4TOTAL, T3FREE, THYROIDAB in the last 72 hours.  Invalid input(s): FREET3 ------------------------------------------------------------------------------------------------------------------ ID Labs Recent Labs  Lab 04/28/21 1303 04/29/21 0555  WBC 15.2* 9.2  PLT 270 232  CREATININE 5.80* 6.73*   Cardiac Enzymes No results for input(s): CKMB, TROPONINI, MYOGLOBIN in the last 168 hours.  Invalid input(s): CK   Micro Results Recent Results (from the past 240 hour(s))  Resp Panel by RT-PCR (Flu A&B, Covid) Nasopharyngeal Swab     Status: None   Collection Time: 04/28/21  1:03 PM   Specimen: Nasopharyngeal Swab; Nasopharyngeal(NP) swabs in vial transport medium  Result Value Ref Range Status   SARS Coronavirus 2 by RT PCR NEGATIVE NEGATIVE Final    Comment: (NOTE) SARS-CoV-2 target nucleic acids are NOT DETECTED.  The SARS-CoV-2 RNA is  generally detectable in upper respiratory specimens during the acute phase of infection. The lowest concentration of SARS-CoV-2 viral copies this assay can detect is 138 copies/mL. A negative result does not preclude SARS-Cov-2 infection and should not be used as the sole basis for treatment or other patient management decisions. A negative result may occur with  improper specimen collection/handling, submission of specimen other than nasopharyngeal swab, presence of viral mutation(s) within the areas targeted by this assay, and inadequate number of viral copies(<138 copies/mL). A negative result must be combined with clinical observations, patient history, and epidemiological information. The expected result is Negative.  Fact Sheet for Patients:  EntrepreneurPulse.com.au  Fact Sheet for Healthcare Providers:  IncredibleEmployment.be  This test is no t yet approved or cleared by the [place] FDA and  has been authorized for detection and/or diagnosis of SARS-CoV-2 by FDA under an Emergency Use Authorization (EUA). This EUA will remain  in effect (meaning this test can be used) for the duration of the COVID-19 declaration under Section 564(b)(1) of the Act, 21 U.S.C.section 360bbb-3(b)(1), unless the authorization is terminated  or revoked sooner.       Influenza A by PCR NEGATIVE NEGATIVE Final   Influenza B by PCR NEGATIVE NEGATIVE Final    Comment: (NOTE) The Xpert Xpress SARS-CoV-2/FLU/RSV plus assay is intended as an aid in the diagnosis of influenza from Nasopharyngeal swab specimens and should not be used as a sole basis for treatment. Nasal washings and aspirates are unacceptable for Xpert Xpress SARS-CoV-2/FLU/RSV testing.  Fact Sheet for Patients: EntrepreneurPulse.com.au  Fact Sheet for Healthcare Providers: IncredibleEmployment.be  This test is not yet approved or cleared by the [place] FDA and has been authorized for detection and/or diagnosis of SARS-CoV-2 by FDA under an Emergency Use Authorization (EUA). This EUA will remain in effect (meaning this test can be used) for the duration of the COVID-19 declaration under Section 564(b)(1) of the Act, 21 U.S.C. section 360bbb-3(b)(1), unless the authorization is terminated or revoked.  Performed at [facility], [address], [place], [place] [address]     Radiology Reports DG Chest [place] 1 View  Result Date: 04/28/2021 CLINICAL DATA:  Shortness of breath EXAM: PORTABLE CHEST  1 VIEW COMPARISON:  Chest x-ray 01/14/2021 FINDINGS: Cardiomegaly and mediastinum appear unchanged. Calcified plaques in the thoracic aorta. Right-sided central line with the tip in the right atrium. Left-sided atrial loop recorder. Pulmonary vascular prominence. No focal consolidation identified. No definite pleural effusion visualized. No pneumothorax. IMPRESSION: Cardiomegaly with pulmonary vascular congestion. Electronically Signed   By: Ofilia Neas M.D.   On: 04/28/2021 12:58

## 2021-04-29 NOTE — TOC Progression Note (Signed)
Transition of Care (TOC) - Progression Note  ? ? ?Patient Details  ?Name: Kelli [name] ?MRN: [id number] ?Date of Birth: 12-Nov-1950 ? ?Transition of Care (TOC) CM/SW Contact  ?Salome Arnt, LCSW ?Phone Number: ?04/29/2021, 10:53 AM ? ?Clinical Narrative:  ?Transition of Care (TOC) Screening Note ? ? ?Patient Details  ?Name: Kelli [name] ?Date of Birth: March 16, 1950 ? ? ?Transition of Care (TOC) CM/SW Contact:    ?Salome Arnt, LCSW ?Phone Number: ?04/29/2021, 10:53 AM ? ? ? ?Transition of Care Department [facility]) has reviewed patient and no TOC needs have been identified at this time. We will continue to monitor patient advancement through interdisciplinary progression rounds. If new patient transition needs arise, please place a TOC consult. ?    ? ? ? ?  ?Barriers to Discharge: Continued Medical Work up ? ?Expected Discharge Plan and Services ?  ?  ?  ?  ?  ?                ?  ?  ?  ?  ?  ?  ?  ?  ?  ?  ? ? ?Social Determinants of Health (SDOH) Interventions ?  ? ?Readmission Risk Interventions ?Readmission Risk Prevention Plan 01/16/2021  ?Transportation Screening Complete  ?[place] or Home Care Consult Complete  ?Social Work Consult for [place] Planning/Counseling Complete  ?Palliative Care Screening Not Applicable  ?Medication Review Press photographer) Complete  ?Some recent data might be hidden  ? ? ?

## 2021-04-29 NOTE — Procedures (Signed)
Interventional Radiology Procedure Note ? ?Procedure:  ?1) Central venogram ?2) Venoplasty of right innominate vein and superior vena cava ?3) Intravascular ultrasound  ?4) Replacement of tunneled hemodialysis catheter. ? ?Findings: Please refer to procedural dictation for full description. Fibrin sheath extending from right innominate vein through SVC.  Fibrin sheath disruption with 10 mm balloon followed by IVUS-guided navigation back to the native SVC lumen.  Replacement of 23 cm tunneled hemodialysis catheter, with tip positioned in right atrium. ? ?Complications: None immediate ? ?Estimated Blood Loss:  < 5 mL ? ?Recommendations: ?Catheter ready for immediate use. ? ? ?Ruthann Cancer, MD ?Pager: [phone] ? ? ? ?

## 2021-04-29 NOTE — Progress Notes (Addendum)
Patient ID: Kelli [name], female   DOB: August 09, 1950, 71 y.o.   MRN: [id number] ?HPI:  Pt is a 71 y.o. yo female  with history of hypertension, type 2 diabetes, chronic diastolic CHF, stroke, HLD, anxiety, ESRD on HD TTS schedule at [facility] who presented to [facility] ED yesterday with malfunctioning TDC.  Has a history of difficult HD catheter placement and exchange.  She is currently at [facility] IR for replacement of her HD catheter.  She was unable to get HD yesterday due to poorly functioning catheter and we were asked to provide HD after [facility] is replaced.  She was also noted to have a Hgb of 5.9 and is receiving blood transfusion. ?O:BP 132/80   Pulse 97   Temp 97.8 ?F (36.6 ?C) (Oral)   Resp 16   Ht 5\' 5"  (1.651 m)   Wt 85.7 kg   SpO2 100%   BMI 31.45 kg/m?  ? ?Intake/Output Summary (Last 24 hours) at 04/29/2021 1049 ?Last data filed at 04/29/2021 0849 ?Gross per 24 hour  ?Intake 620.53 ml  ?Output --  ?Net 620.53 ml  ? ?Intake/Output: ?I/O last 3 completed shifts: ?In: 305.5 [P.O.:240; IV Piggyback:65.5] ?Out: -  ? Intake/Output this shift: ? Total I/O ?In: 315 [Blood:315] ?Out: -  ?Weight change:  ?Pt not available for exam (at [facility] IR) ? ?Recent Labs  ?Lab 04/28/21 ?1303 04/29/21 ?2725  ?NA 139 137  ?K 3.9 4.2  ?CL 103 99  ?CO2 23 23  ?GLUCOSE 144* 72  ?BUN 38* 44*  ?CREATININE 5.80* 6.73*  ?CALCIUM 6.7* 6.7*  ? ?Liver Function Tests: ?No results for input(s): AST, ALT, ALKPHOS, BILITOT, PROT, ALBUMIN in the last 168 hours. ?No results for input(s): LIPASE, AMYLASE in the last 168 hours. ?No results for input(s): AMMONIA in the last 168 hours. ?CBC: ?Recent Labs  ?Lab 04/28/21 ?1303 04/29/21 ?3664  ?WBC 15.2* 9.2  ?NEUTROABS 12.2*  --   ?HGB 7.0* 5.9*  ?HCT 23.7* 20.3*  ?MCV 94.0 93.1  ?PLT 270 232  ? ?Cardiac Enzymes: ?No results for input(s): CKTOTAL, CKMB, CKMBINDEX, TROPONINI in the last 168 hours. ?CBG: ?Recent Labs  ?Lab 04/29/21 ?0903  ?GLUCAP 57*  ? ? ?Iron Studies:  ?Recent Labs  ?  04/28/21 ?1303  ?IRON 43   ?TIBC 225*  ?FERRITIN 398*  ? ?Studies/Results: ?DG Chest Port 1 View ? ?Result Date: 04/28/2021 ?CLINICAL DATA:  Shortness of breath EXAM: PORTABLE CHEST 1 VIEW COMPARISON:  Chest x-ray 01/14/2021 FINDINGS: Cardiomegaly and mediastinum appear unchanged. Calcified plaques in the thoracic aorta. Right-sided central line with the tip in the right atrium. Left-sided atrial loop recorder. Pulmonary vascular prominence. No focal consolidation identified. No definite pleural effusion visualized. No pneumothorax. IMPRESSION: Cardiomegaly with pulmonary vascular congestion. Electronically Signed   By: Ofilia Neas M.D.   On: 04/28/2021 12:58   ? atorvastatin  40 mg Oral QPM  ? Chlorhexidine Gluconate Cloth  6 each Topical Q0600  ? heparin  5,000 Units Subcutaneous Q8H  ? insulin aspart  0-6 Units Subcutaneous Q4H  ? metoprolol succinate  100 mg Oral QPM  ? sodium chloride flush  3 mL Intravenous Q12H  ? venlafaxine XR  150 mg Oral Daily  ? ? ?BMET ?   ?Component Value Date/Time  ? NA 137 04/29/2021 0555  ? K 4.2 04/29/2021 0555  ? CL 99 04/29/2021 0555  ? CO2 23 04/29/2021 0555  ? GLUCOSE 72 04/29/2021 0555  ? BUN 44 (H) 04/29/2021 0555  ? CREATININE 6.73 (  H) 04/29/2021 9758  ? CALCIUM 6.7 (L) 04/29/2021 0555  ? CALCIUM 7.6 (L) 10/14/2016 8325  ? GFRNONAA 6 (L) 04/29/2021 0555  ? GFRAA 7 (L) 09/13/2019 0755  ? ?CBC ?   ?Component Value Date/Time  ? WBC 9.2 04/29/2021 0555  ? RBC 2.18 (L) 04/29/2021 0555  ? HGB 5.9 (LL) 04/29/2021 0555  ? HCT 20.3 (L) 04/29/2021 0555  ? PLT 232 04/29/2021 0555  ? MCV 93.1 04/29/2021 0555  ? MCH 27.1 04/29/2021 0555  ? MCHC 29.1 (L) 04/29/2021 0555  ? RDW 18.6 (H) 04/29/2021 0555  ? LYMPHSABS 1.1 04/28/2021 1303  ? MONOABS 1.5 (H) 04/28/2021 1303  ? EOSABS 0.2 04/28/2021 1303  ? BASOSABS 0.0 04/28/2021 1303  ? ?Outpatient HD prescription:  [facility], TTS, EDW 74.5kg, Revaclr Dialyzer  Time 3:45, [place] 3K/2.5Ca, Access RIJ TDC, BFR 350, DFR 500, Heparin 1500 units bolus then 1200  units/hr ? ?Micera 150 mcg IVP every 2 weeks, calcitriol 1 mcg qTTS,  ? ?Assessment/Plan: ? ?Poorly functioning TDC - now at [facility] IR for exchange and likely angioplasty. ?ESRD - normally TTS but unable to have outpatient HD due to catheter malfunction.  Will plan for HD after she has new one placed. ?Acute on chronic anemia - received blood transfusion and follow.  Hgb was 9.2 on 03/24/21 ?HTN/Volume - was SOB in ED and requiring supplemental oxygen.  Will UF as tolerated and follow. Covid/flu negative. ?Acute hypoxic respiratory failure - as above UF as tolerated.  Given IV lasix 160 mg x 1 but no UOP documented. ?Atrial fibrillation - Eliquis on hold for procedure ?DM type 2 - per primary ?Disposition - hopefully can be discharged after HD if Hgb is stable and able to wean off of oxygen and follow up with her outpatient HD tomorrow.  ? ?Donetta Potts, MD ?[facility] ?[phone] ? ?

## 2021-04-29 NOTE — Progress Notes (Signed)
Kelli [name] transferred from [facility] today for malfunctioning [facility]. S/p venogram, venoplasty, and TDC exchange in [facility] IR by Dr. Serafina Royals today. Seen and examined patient on HD. Currently tolerating UFG 4.5L. VS also stable. ? ?Dialysis Orders: ?[facility], TTS, EDW 74.5kg, Revaclr Dialyzer  Time 3:45, [place] 3K/2.5Ca, Access RIJ TDC (s/p exchange 3/1), BFR 350, DFR 500, Heparin 1500 units bolus then 1200 units/hr ? ?Tobie Poet, NP ?

## 2021-04-29 NOTE — Sedation Documentation (Signed)
Patient transported from [facility] via [place] to [facility] radiology department for HD line exchange. Report received from [place]. PRBC transfusing through Right [facility] piv @ 129ml/hr on arrival to department. Vital signs taken. Will continue to monitor.  ?

## 2021-04-29 NOTE — Progress Notes (Signed)
Patient with low cbg of 57, npo status, Dr. Roderic Palau notified. D50 given prior to patient going with carelink to IR at [facility]. Patient also with 1 unit of blood currently transfusing.  ?

## 2021-04-29 NOTE — Progress Notes (Signed)
Patient husband Kelli [name] informed that patient will stay at [facility] for further management of care by hospitalist. Husband informed that patient is in in 4east room 11. No patient belongings present. Patient also aware that she will be staying at [facility] and not returning to [facility].  ?

## 2021-04-29 NOTE — Progress Notes (Signed)
? ?  Patient Status: [facility] IP ? ?Assessment and Plan: ?Patient in need of venous access.  ? ?Tunneled HD cath exchange with possible intervention ? ?______________________________________________________________________ ? ? ?History of Present Illness: ?Kelli [name] is a 71 y.o. female  ? ?HD cath is not working per MD ?Has not had dialysis since Saturday Feb 25 ?Last exchange 08/22/20 in IR at [facility] ? ?IMPRESSION: ?1. Technically successful revision and replacement of tunneled right ?IJ hemodialysis catheter with fluoroscopic guidance. Ready for ?routine use. ?2. Extensive fibrin sheath/thrombus along the previous catheter ?course, with good response to 14 mm PTA  ?ACCESS: ?Remains approachable for percutaneous intervention as needed ? ?Scheduled now for exchange possible intervention ?Hg 5.9 this am ?Planned for prbcs at [facility] this am asap ? ?Allergies and medications reviewed.  ? ?Review of Systems: A 12 point ROS discussed and pertinent positives are indicated in the HPI above.  All other systems are negative. ? ?Vital Signs: ?BP 112/76 (BP Location: Right Arm)   Pulse 78   Temp 98 ?F (36.7 ?C) (Oral)   Resp 19   Ht 5\' 5"  (1.651 m)   Wt 189 lb (85.7 kg)   SpO2 (!) 76%   BMI 31.45 kg/m?  ? ?Physical Exam ?Vitals reviewed.  ?Cardiovascular:  ?   Rate and Rhythm: Normal rate and regular rhythm.  ?Pulmonary:  ?   Effort: Pulmonary effort is normal.  ?   Breath sounds: Wheezing present.  ?Skin: ?   General: Skin is warm.  ?Neurological:  ?   Comments: Groggy ?Can tell me name and dob ?Not aware of place or condition  ? ? ? ?Imaging reviewed.  ? ?Labs: ? ?COAGS: ?Recent Labs  ?  01/15/21 ?0404  ?INR 1.2  ?APTT 31  ? ? ?BMP: ?Recent Labs  ?  01/16/21 ?0601 01/17/21 ?0717 04/28/21 ?1303 04/29/21 ?2836  ?NA 138 138 139 137  ?K 4.4 4.8 3.9 4.2  ?CL 102 103 103 99  ?CO2 24 20* 23 23  ?GLUCOSE 129* 128* 144* 72  ?BUN 34* 45* 38* 44*  ?CALCIUM 8.3* 8.4* 6.7* 6.7*  ?CREATININE 4.64* 5.68* 5.80* 6.73*  ?GFRNONAA 10* 8* 7* 6*   ? ? ?Scheduled for HD cath exchange and possible intervention in IR today ?Transport is scheduled soon----planned for prbs stat ?Pts spouse aware of procedure benefits and risks and agreeable to proceed ?Consent signed and with pt packet ? ? ? ?Electronically Signed: ?Lavonia Drafts, PA-C ?04/29/2021, 7:53 AM ? ? ?I spent a total of 15 minutes in face to face in clinical consultation, greater than 50% of which was counseling/coordinating care for venous access.  ?

## 2021-04-30 DIAGNOSIS — J9601 Acute respiratory failure with hypoxia: Secondary | ICD-10-CM | POA: Diagnosis not present

## 2021-04-30 DIAGNOSIS — I482 Chronic atrial fibrillation, unspecified: Secondary | ICD-10-CM

## 2021-04-30 DIAGNOSIS — D649 Anemia, unspecified: Secondary | ICD-10-CM | POA: Diagnosis not present

## 2021-04-30 DIAGNOSIS — I10 Essential (primary) hypertension: Secondary | ICD-10-CM

## 2021-04-30 DIAGNOSIS — J810 Acute pulmonary edema: Secondary | ICD-10-CM | POA: Diagnosis not present

## 2021-04-30 LAB — CBC WITH DIFFERENTIAL/PLATELET
Abs Immature Granulocytes: 0.09 10*3/uL — ABNORMAL HIGH (ref 0.00–0.07)
Basophils Absolute: 0 10*3/uL (ref 0.0–0.1)
Basophils Relative: 0 %
Eosinophils Absolute: 0 10*3/uL (ref 0.0–0.5)
Eosinophils Relative: 0 %
HCT: 26.9 % — ABNORMAL LOW (ref 36.0–46.0)
Hemoglobin: 8.3 g/dL — ABNORMAL LOW (ref 12.0–15.0)
Immature Granulocytes: 1 %
Lymphocytes Relative: 12 %
Lymphs Abs: 1.3 10*3/uL (ref 0.7–4.0)
MCH: 27.9 pg (ref 26.0–34.0)
MCHC: 30.9 g/dL (ref 30.0–36.0)
MCV: 90.6 fL (ref 80.0–100.0)
Monocytes Absolute: 1.1 10*3/uL — ABNORMAL HIGH (ref 0.1–1.0)
Monocytes Relative: 10 %
Neutro Abs: 8.2 10*3/uL — ABNORMAL HIGH (ref 1.7–7.7)
Neutrophils Relative %: 77 %
Platelets: 245 10*3/uL (ref 150–400)
RBC: 2.97 MIL/uL — ABNORMAL LOW (ref 3.87–5.11)
RDW: 17.2 % — ABNORMAL HIGH (ref 11.5–15.5)
WBC: 10.7 10*3/uL — ABNORMAL HIGH (ref 4.0–10.5)
nRBC: 0 % (ref 0.0–0.2)

## 2021-04-30 LAB — GLUCOSE, CAPILLARY
Glucose-Capillary: 138 mg/dL — ABNORMAL HIGH (ref 70–99)
Glucose-Capillary: 98 mg/dL (ref 70–99)
Glucose-Capillary: 98 mg/dL (ref 70–99)
Glucose-Capillary: 93 mg/dL (ref 70–99)
Glucose-Capillary: 89 mg/dL (ref 70–99)
Glucose-Capillary: 121 mg/dL — ABNORMAL HIGH (ref 70–99)

## 2021-04-30 LAB — TYPE AND SCREEN
ABO/RH(D): A NEG
ABO/RH(D): A NEG
Antibody Screen: NEGATIVE
Antibody Screen: NEGATIVE
Unit division: 0

## 2021-04-30 LAB — COMPREHENSIVE METABOLIC PANEL
ALT: 10 U/L (ref 0–44)
AST: 11 U/L — ABNORMAL LOW (ref 15–41)
Albumin: 2.4 g/dL — ABNORMAL LOW (ref 3.5–5.0)
Alkaline Phosphatase: 78 U/L (ref 38–126)
Anion gap: 15 (ref 5–15)
BUN: 19 mg/dL (ref 8–23)
CO2: 25 mmol/L (ref 22–32)
Calcium: 7.6 mg/dL — ABNORMAL LOW (ref 8.9–10.3)
Chloride: 98 mmol/L (ref 98–111)
Creatinine, Ser: 3.71 mg/dL — ABNORMAL HIGH (ref 0.44–1.00)
GFR, Estimated: 13 mL/min — ABNORMAL LOW (ref >60)
Glucose, Bld: 109 mg/dL — ABNORMAL HIGH (ref 70–99)
Potassium: 4.1 mmol/L (ref 3.5–5.1)
Sodium: 138 mmol/L (ref 135–145)
Total Bilirubin: 0.9 mg/dL (ref 0.3–1.2)
Total Protein: 5.8 g/dL — ABNORMAL LOW (ref 6.5–8.1)

## 2021-04-30 LAB — HEPATITIS B SURFACE ANTIGEN: Hepatitis B Surface Ag: NONREACTIVE

## 2021-04-30 LAB — CBC
HCT: 25.5 % — ABNORMAL LOW (ref 36.0–46.0)
Hemoglobin: 7.7 g/dL — ABNORMAL LOW (ref 12.0–15.0)
MCH: 27.9 pg (ref 26.0–34.0)
MCHC: 30.2 g/dL (ref 30.0–36.0)
MCV: 92.4 fL (ref 80.0–100.0)
Platelets: 236 10*3/uL (ref 150–400)
RBC: 2.76 MIL/uL — ABNORMAL LOW (ref 3.87–5.11)
RDW: 17.4 % — ABNORMAL HIGH (ref 11.5–15.5)
WBC: 8.5 10*3/uL (ref 4.0–10.5)
nRBC: 0 % (ref 0.0–0.2)

## 2021-04-30 LAB — BPAM RBC
Blood Product Expiration Date: 202303212359
ISSUE DATE / TIME: 202303010838
Unit Type and Rh: 600

## 2021-04-30 LAB — MAGNESIUM: Magnesium: 1.7 mg/dL (ref 1.7–2.4)

## 2021-04-30 LAB — HEPATITIS B SURFACE ANTIBODY,QUALITATIVE: Hep B S Ab: NONREACTIVE

## 2021-04-30 LAB — BRAIN NATRIURETIC PEPTIDE: B Natriuretic Peptide: 1895.2 pg/mL — ABNORMAL HIGH (ref 0.0–100.0)

## 2021-04-30 MED ORDER — SODIUM CHLORIDE 0.9 % IV SOLN: 100.0000 mL | INTRAVENOUS | Status: DC | PRN

## 2021-04-30 MED ORDER — PENTAFLUOROPROP-TETRAFLUOROETH EX AERO: 1.0000 "application " | INHALATION_SPRAY | CUTANEOUS | Status: DC | PRN

## 2021-04-30 MED ORDER — LIDOCAINE HCL (PF) 1 % IJ SOLN: 5.0000 mL | INTRAMUSCULAR | Status: DC | PRN

## 2021-04-30 MED ORDER — LIDOCAINE-PRILOCAINE 2.5-2.5 % EX CREA
1.0000 "application " | TOPICAL_CREAM | CUTANEOUS | Status: DC | PRN
  Filled 2021-04-30: qty 5

## 2021-04-30 MED ORDER — HEPARIN SODIUM (PORCINE) 1000 UNIT/ML DIALYSIS
20.0000 [IU]/kg | INTRAMUSCULAR | Status: DC | PRN
  Filled 2021-04-30: qty 2

## 2021-04-30 MED ORDER — HEPARIN SODIUM (PORCINE) 1000 UNIT/ML DIALYSIS
1000.0000 [IU] | INTRAMUSCULAR | Status: DC | PRN
  Filled 2021-04-30 (×2): qty 1

## 2021-04-30 MED ORDER — ALTEPLASE 2 MG IJ SOLR: 2.0000 mg | Freq: Once | INTRAMUSCULAR | Status: DC | PRN

## 2021-04-30 MED ORDER — LOPERAMIDE HCL 2 MG PO CAPS
4.0000 mg | ORAL_CAPSULE | Freq: Once | ORAL | Status: AC
  Administered 2021-04-30: 4 mg via ORAL
  Filled 2021-04-30: qty 2

## 2021-04-30 NOTE — Progress Notes (Addendum)
PROGRESS NOTE                                                                                                                                                                                                             Patient Demographics:    Kelli [name], is a 71 y.o. female, DOB - 1950/08/20, BHA:[id number]  Outpatient Primary MD for the patient is [name], [name], MD    LOS - 1  Admit date - 04/28/2021    Chief Complaint  Patient presents with   Shortness of Breath       Brief Narrative (HPI from H&P)   71 year old female with history of ESRD on TTS schedule who lives in [place], [place] type II, chronic diastolic CHF, paroxysmal atrial fibrillation [place] vas 2 score of greater than 3 on Eliquis, essential hypertension, dyslipidemia and anxiety.  Patient presented to [facility], ER yesterday after she had issues with her HD dialysis catheter and she could only have 500 cc of fluid removed on 04/28/2021.  At the time of presentation to [facility], ER she was noted to have a hemoglobin of 7, mild leukocytosis, she was admitted for HD catheter exchange.  From [facility] she was transferred to [facility] for HD catheter exchange on 04/29/2021 she was noted to have a hemoglobin of 5.9 this morning at [facility] and a unit of blood transfusion was started, upon arrival to [facility], IR department she was noted to have 1 unit of packed RBC transfusion running, she also had bilateral crackles and had difficulty laying flat.  I was called to evaluate the patient in the IR department for further   Subjective:   Patient in bed, appears comfortable, denies any headache, no fever, no chest pain or pressure, no shortness of breath , no abdominal pain. No new focal weakness.    Assessment  & Plan :    Acute hypoxic respiratory failure due to acute on chronic diastolic CHF from missed dialysis treatment, HD catheter malfunction at [facility], worsened by acute on chronic anemia.  She had her HD catheter is exchanged 04/29/21.  Underwent under urgent  HD on 04/29/2021, shortness of breath much improved, renal following, TTS schedule.   ESRD.  On TTS schedule.   TTS schedule renal following  Acute anemia in the presence of anemia of chronic disease in a patient who is on Eliquis.  No history of melena or blood in stool, she is on Eliquis, no signs of brisk ongoing GI bleed, s/p 1 unit of packed RBC transfusion on 04/29/2021 with stable posttransfusion H&H, on IV PPI, Eliquis on hold GI on board.  Continue to monitor closely.  Essential hypertension.  On beta-blocker continue and monitor.  Acute on chronic diastolic CHF EF 63% on echocardiogram done in November 2022.  This is due to missed dialysis treatment kindly see plan in #1 above continue beta-blocker for proper diastolic refilling.  Obesity.  BMI 31.  Follow with PCP for weight loss.  Mild metabolic encephalopathy.  Upon talking to the husband it seems like patient is developing some early dementia or age-related cognitive decline, she has some acute metabolic encephalopathy on top of it due to missed HD treatment, anemia and being in unfamiliar setting.  DM type II.  Sliding scale.  Lab Results  Component Value Date   HGBA1C 4.9 04/29/2021    CBG (last 3)  Recent Labs    04/29/21 2146 04/30/21 0627 04/30/21 0803  GLUCAP 107* 98 98          Condition - Extremely Guarded  Family Communication  : Husband Curtis over the phone on 04/30/2021  Code Status :  Full  Consults  :  IR, GI, Renal  PUD Prophylaxis : PPI   Procedures  :     New dialysis catheter to be placed 04/29/2021      Disposition Plan  :    Status is: Observation  DVT Prophylaxis  :    Place and maintain sequential compression device Start: 04/29/21 1224    Lab Results  Component Value Date   PLT 245 04/30/2021    Diet :  Diet Order             DIET SOFT Room service  appropriate? Yes; Fluid consistency: Nectar Thick  Diet effective now                    Inpatient Medications  Scheduled Meds:  atorvastatin  40 mg Oral QPM   calcium acetate  1,334 mg Oral TID WC   Chlorhexidine Gluconate Cloth  6 each Topical Q0600   dextrose  50 mL Intravenous Once   insulin aspart  0-6 Units Subcutaneous TID WC   metoprolol succinate  100 mg Oral QPM   pantoprazole (PROTONIX) IV  40 mg Intravenous Q12H   venlafaxine XR  150 mg Oral Daily   Continuous Infusions:  sodium chloride     sodium chloride     PRN Meds:.sodium chloride, sodium chloride, acetaminophen **OR** acetaminophen, alteplase, heparin, heparin, hydrALAZINE, lidocaine (PF), lidocaine-prilocaine, ondansetron **OR** ondansetron (ZOFRAN) IV, pentafluoroprop-tetrafluoroeth  Antibiotics  :    Anti-infectives (From admission, onward)    Start     Dose/Rate Route Frequency Ordered Stop   04/29/21 1430  ceFAZolin (ANCEF) IVPB 2g/100 mL premix        2 g 200 mL/hr over 30 Minutes Intravenous  Once 04/29/21 1332 04/29/21 1402        Time Spent in minutes  30   Lala Lund M.D on 04/30/2021 at 9:32 AM  To page go to [email or web address]   [facility] -  Office  [phone]  See all Orders from today for further details    Objective:   Vitals:   04/29/21 2105 04/30/21 0005 04/30/21 0353 04/30/21 0700  BP: 111/72 130/69 125/90 135/80  Pulse: 95 97 100 (!) 103  Resp: _0 Temp: 98.5 F (36.9 C) 99.5 F (37.5 C) 98.4 F (36.9 C) 98.6 F (37 C)  TempSrc: Oral Oral Oral Oral  SpO2: 97% 92% 91% 96%  Weight:      Height:        Wt Readings from Last 3 Encounters:  04/29/21 85.7 kg  01/17/21 71 kg  11/21/20 75.3 kg     Intake/Output Summary (Last 24 hours) at 04/30/2021 0932 Last data filed at 04/30/2021 0353 Gross per 24 hour  Intake 1073 ml  Output 3000 ml  Net -1927 ml     Physical Exam  Awake Alert x 1, right tunneled HD catheter site appears clean, No new  F.N deficits,   [place].AT,PERRAL Supple Neck, No JVD,   Symmetrical Chest wall movement, Good air movement bilaterally, CTAB RRR,No Gallops, Rubs or new Murmurs,  +ve B.Sounds, Abd Soft, No tenderness,   No Cyanosis, Clubbing or edema     RN pressure injury documentation: Pressure Ulcer 04/26/14 Stage II -  Partial thickness loss of dermis presenting as a shallow open ulcer with a red, pink wound bed without slough. pt states this comes from wears a pad in her depends at home (Active)  04/26/14 1503  Location: Sacrum  Location Orientation: Mid  Staging: Stage II -  Partial thickness loss of dermis presenting as a shallow open ulcer with a red, pink wound bed without slough.  Wound Description (Comments): pt states this comes from wears a pad in her depends at home  Present on Admission: Yes     Pressure Injury 09/11/19 Buttocks Left Stage 2 -  Partial thickness loss of dermis presenting as a shallow open injury with a red, pink wound bed without slough. (Active)  09/11/19 2200  Location: Buttocks  Location Orientation: Left  Staging: Stage 2 -  Partial thickness loss of dermis presenting as a shallow open injury with a red, pink wound bed without slough.  Wound Description (Comments):   Present on Admission: Yes     Pressure Injury 01/15/21 Sacrum Lower;Medial Stage 1 -  Intact skin with non-blanchable redness of a localized area usually over a bony prominence. Stage 1, healed pressure injury to right sacrum (Active)  01/15/21 0631  Location: Sacrum  Location Orientation: Lower;Medial  Staging: Stage 1 -  Intact skin with non-blanchable redness of a localized area usually over a bony prominence.  Wound Description (Comments): Stage 1, healed pressure injury to right sacrum  Present on Admission: Yes     Data Review:    CBC Recent Labs  Lab 04/28/21 1303 04/29/21 0555 04/29/21 2000 04/29/21 2153 04/30/21 0146  WBC 15.2* 9.2  --  11.9* 10.7*  HGB 7.0* 5.9* 8.3* 7.3* 8.3*   HCT 23.7* 20.3* 26.7* 23.3* 26.9*  PLT 270 232  --  218 245  MCV 94.0 93.1  --  88.6 90.6  MCH 27.8 27.1  --  27.8 27.9  MCHC 29.5* 29.1*  --  31.3 30.9  RDW 18.7* 18.6*  --  17.4* 17.2*  LYMPHSABS 1.1  --   --   --  1.3  MONOABS 1.5*  --   --   --  1.1*  EOSABS 0.2  --   --   --  0.0  BASOSABS 0.0  --   --   --  0.0    Electrolytes Recent Labs  Lab 04/28/21 1303 04/29/21 0555 04/30/21 0146  NA 139 137 138  K 3.9 4.2 4.1  CL 103 99 98  CO2 _0 GLUCOSE 144* 72 109*  BUN 38* 44* 19  CREATININE 5.80* 6.73* 3.71*  CALCIUM 6.7* 6.7* 7.6*  AST  --   --  11*  ALT  --   --  10  ALKPHOS  --   --  78  BILITOT  --   --  0.9  ALBUMIN  --   --  2.4*  MG  --  1.9 1.7  HGBA1C  --  4.9  --   BNP  --   --  5,409.8*    ------------------------------------------------------------------------------------------------------------------ No results for input(s): CHOL, HDL, LDLCALC, TRIG, CHOLHDL, LDLDIRECT in the last 72 hours.  Lab Results  Component Value Date   HGBA1C 4.9 04/29/2021    No results for input(s): TSH, T4TOTAL, T3FREE, THYROIDAB in the last 72 hours.  Invalid input(s): FREET3 ------------------------------------------------------------------------------------------------------------------ ID Labs Recent Labs  Lab 04/28/21 1303 04/29/21 0555 04/29/21 2153 04/30/21 0146  WBC 15.2* 9.2 11.9* 10.7*  PLT 270 232 218 245  CREATININE 5.80* 6.73*  --  3.71*   Cardiac Enzymes No results for input(s): CKMB, TROPONINI, MYOGLOBIN in the last 168 hours.  Invalid input(s): CK   Radiology Reports IR Venocavagram Svc  Result Date: 04/29/2021 INDICATION: 71 year old female with history of end-stage renal disease and malfunctioning indwelling right internal jugular tunneled hemodialysis catheter. The patient has previously presented for multiple tunneled line exchanges and central venoplasty. EXAM: 1. TUNNELED CENTRAL VENOUS HEMODIALYSIS CATHETER REPLACEMENT WITH  FLUOROSCOPIC GUIDANCE 2. Central venogram 3. Intravascular ultrasound 4. Balloon venoplasty of the right innominate vein and superior vena cava. MEDICATIONS: 2 g Ancef, intravenous Moderate (conscious) sedation was employed during this procedure. A total of Versed 0.5 mg and Fentanyl 25 mcg was administered intravenously. Moderate Sedation Time: 42 minutes. The patient's level of consciousness and vital signs were monitored continuously by radiology nursing throughout the procedure under my direct supervision. FLUOROSCOPY TIME:  Seven minutes 42 seconds, 65 mGy CONTRAST:  20 mL Omnipaque 300, intravenous COMPLICATIONS: None immediate. PROCEDURE: Informed written consent was obtained from the patient after a discussion of the risks, benefits, and alternatives to treatment. Questions regarding the procedure were encouraged and answered. The skin and external portion of the existing hemodialysis catheter was prepped with chlorhexidine in a sterile fashion, and a sterile drape was applied covering the operative field. Maximum barrier sterile technique with sterile gowns and gloves were used for the procedure. A timeout was performed prior to the initiation of the procedure. Both lumens of the hemodialysis catheter were cannulated with a stiff Glidewire and advanced to the level of the right atrium. Under intermittent fluoroscopic guidance, the existing dialysis catheter was exchanged for a 12 French, 45 cm vascular sheath with the tip positioned in the right innominate vein, which was inserted over 1 of the glide wires. Central venogram was performed which demonstrated patency of the superior vena cava with brisk antegrade flow. There is mild tortuosity of the central superior vena cava near the cavoatrial junction. Intravascular ultrasound was then utilized to study the central veins. Within the innominate vein, extending into the superior vena cava is an echogenic fibrin sheath surrounding the indwelling catheter  track. Balloon venoplasty was then performed of the right innominate vein and superior vena cava  with a 10 mm x 8 cm Athletis balloon. Repeat intravascular ultrasound evaluation demonstrated no evidence of vessel rupture, however did demonstrate disruption of the fibrin sheath. Under intravascular ultrasound guidance, the safety glidewire was retracted to the peripheral aspect of the right innominate vein and redirected outside of the fibrin sheath to the normal lumen of the innominate vein and superior vena cava. The tip of the wire was then positioned in the inferior vena cava. The Rosen wire and sheath were removed. A new, 23 cm tunneled hemodialysis catheter was inserted over the indwelling Glidewire. Unfortunately there was the resistance met and the catheter was unable to be advanced beyond the level of the cavoatrial junction into the right atrium. Therefore, the catheter was removed and the sheath was reinserted. Additional balloon venoplasty was performed about the cavoatrial junction and central SVC with a 14 mm x 4 cm Atlas balloon. The sheath was then again removed and the 23 cm tunneled hemodialysis catheter was inserted without difficulty. The tip was positioned in the right atrium, at the most cavernous location correlated with intravascular ultrasound imaging. The catheter aspirates and flushes normally. The catheter was flushed with appropriate volume heparin dwells. The catheter exit site was secured with a 0-Silk retention sutures. Both lumens were heparinized. A dressing was placed. The patient tolerated the procedure well without immediate post procedural complication. IMPRESSION: 1. 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 Tortuosity and mild stenosis of the cavoatrial junction. 2. Intravascular ultrasound guided fibrin sheath disruption and repositioning of hemodialysis access, external to the fibrin sheath. 3. Technically successful replacement of 23 cm tunneled  hemodialysis catheter with the tip in the right atrium. PLAN: The catheter is ready for immediate use. Ruthann Cancer, MD [facility] Radiology Electronically Signed   By: Ruthann Cancer M.D.   On: 04/29/2021 16:05   IR Fluoro Guide CV Line Right  Result Date: 04/29/2021 INDICATION: 71 year old female with history of end-stage renal disease and malfunctioning indwelling right internal jugular tunneled hemodialysis catheter. The patient has previously presented for multiple tunneled line exchanges and central venoplasty. EXAM: 1. TUNNELED CENTRAL VENOUS HEMODIALYSIS CATHETER REPLACEMENT WITH FLUOROSCOPIC GUIDANCE 2. Central venogram 3. Intravascular ultrasound 4. Balloon venoplasty of the right innominate vein and superior vena cava. MEDICATIONS: 2 g Ancef, intravenous Moderate (conscious) sedation was employed during this procedure. A total of Versed 0.5 mg and Fentanyl 25 mcg was administered intravenously. Moderate Sedation Time: 42 minutes. The patient's level of consciousness and vital signs were monitored continuously by radiology nursing throughout the procedure under my direct supervision. FLUOROSCOPY TIME:  Seven minutes 42 seconds, 65 mGy CONTRAST:  20 mL Omnipaque 300, intravenous COMPLICATIONS: None immediate. PROCEDURE: Informed written consent was obtained from the patient after a discussion of the risks, benefits, and alternatives to treatment. Questions regarding the procedure were encouraged and answered. The skin and external portion of the existing hemodialysis catheter was prepped with chlorhexidine in a sterile fashion, and a sterile drape was applied covering the operative field. Maximum barrier sterile technique with sterile gowns and gloves were used for the procedure. A timeout was performed prior to the initiation of the procedure. Both lumens of the hemodialysis catheter were cannulated with a stiff Glidewire and advanced to the level of the  right atrium. Under intermittent fluoroscopic guidance, the existing dialysis catheter was exchanged for a 12 French, 45 cm vascular sheath with the tip positioned in the right innominate vein, which was inserted over 1 of the glide wires.  Central venogram was performed which demonstrated patency of the superior vena cava with brisk antegrade flow. There is mild tortuosity of the central superior vena cava near the cavoatrial junction. Intravascular ultrasound was then utilized to study the central veins. Within the innominate vein, extending into the superior vena cava is an echogenic fibrin sheath surrounding the indwelling catheter track. Balloon venoplasty was then performed of the right innominate vein and superior vena cava with a 10 mm x 8 cm Athletis balloon. Repeat intravascular ultrasound evaluation demonstrated no evidence of vessel rupture, however did demonstrate disruption of the fibrin sheath. Under intravascular ultrasound guidance, the safety glidewire was retracted to the peripheral aspect of the right innominate vein and redirected outside of the fibrin sheath to the normal lumen of the innominate vein and superior vena cava. The tip of the wire was then positioned in the inferior vena cava. The Rosen wire and sheath were removed. A new, 23 cm tunneled hemodialysis catheter was inserted over the indwelling Glidewire. Unfortunately there was the resistance met and the catheter was unable to be advanced beyond the level of the cavoatrial junction into the right atrium. Therefore, the catheter was removed and the sheath was reinserted. Additional balloon venoplasty was performed about the cavoatrial junction and central SVC with a 14 mm x 4 cm Atlas balloon. The sheath was then again removed and the 23 cm tunneled hemodialysis catheter was inserted without difficulty. The tip was positioned in the right atrium, at the most cavernous location correlated with intravascular ultrasound imaging. The  catheter aspirates and flushes normally. The catheter was flushed with appropriate volume heparin dwells. The catheter exit site was secured with a 0-Silk retention sutures. Both lumens were heparinized. A dressing was placed. The patient tolerated the procedure well without immediate post procedural complication. IMPRESSION: 1. 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 Tortuosity and mild stenosis of the cavoatrial junction. 2. Intravascular ultrasound guided fibrin sheath disruption and repositioning of hemodialysis access, external to the fibrin sheath. 3. Technically successful replacement of 23 cm tunneled hemodialysis catheter with the tip in the right atrium. PLAN: The catheter is ready for immediate use. Ruthann Cancer, MD [facility] Radiology Electronically Signed   By: Ruthann Cancer M.D.   On: 04/29/2021 16:05   DG Chest Port 1 View  Result Date: 04/29/2021 CLINICAL DATA:  Shortness of breath, confusion, missed dialysis for 1 week, history hypertension, diabetes mellitus, stroke, end-stage renal disease EXAM: PORTABLE CHEST 1 VIEW COMPARISON:  Portable exam 1537 hours compared to 04/28/2021 FINDINGS: RIGHT jugular line with tip projecting over RIGHT atrium. Enlargement of cardiac silhouette with pulmonary vascular congestion. Loop recorder versus leadless pacemaker projecting over LEFT heart. Atherosclerotic calcification aorta. RIGHT basilar atelectasis. Atelectasis versus consolidation LEFT lower lobe. No definite pleural effusion or pneumothorax. Vascular stents in the upper arms bilaterally. IMPRESSION: Atelectasis versus consolidation at LEFT lung base with mild RIGHT basilar atelectasis. Enlargement of cardiac silhouette with pulmonary vascular congestion. Aortic Atherosclerosis (ICD10-I70.0). Electronically Signed   By: Lavonia Dana M.D.   On: 04/29/2021 15:53   DG Chest Port 1 View  Result Date: 04/28/2021 CLINICAL  DATA:  Shortness of breath EXAM: PORTABLE CHEST 1 VIEW COMPARISON:  Chest x-ray 01/14/2021 FINDINGS: Cardiomegaly and mediastinum appear unchanged. Calcified plaques in the thoracic aorta. Right-sided central line with the tip in the right atrium. Left-sided atrial loop recorder. Pulmonary vascular prominence. No focal consolidation identified. No definite pleural effusion visualized. No pneumothorax. IMPRESSION: Cardiomegaly with pulmonary vascular  congestion. Electronically Signed   By: Ofilia Neas M.D.   On: 04/28/2021 12:58   IR PTA VENOUS EXCEPT DIALYSIS CIRCUIT  Result Date: 04/29/2021 INDICATION: 71 year old female with history of end-stage renal disease and malfunctioning indwelling right internal jugular tunneled hemodialysis catheter. The patient has previously presented for multiple tunneled line exchanges and central venoplasty. EXAM: 1. TUNNELED CENTRAL VENOUS HEMODIALYSIS CATHETER REPLACEMENT WITH FLUOROSCOPIC GUIDANCE 2. Central venogram 3. Intravascular ultrasound 4. Balloon venoplasty of the right innominate vein and superior vena cava. MEDICATIONS: 2 g Ancef, intravenous Moderate (conscious) sedation was employed during this procedure. A total of Versed 0.5 mg and Fentanyl 25 mcg was administered intravenously. Moderate Sedation Time: 42 minutes. The patient's level of consciousness and vital signs were monitored continuously by radiology nursing throughout the procedure under my direct supervision. FLUOROSCOPY TIME:  Seven minutes 42 seconds, 65 mGy CONTRAST:  20 mL Omnipaque 300, intravenous COMPLICATIONS: None immediate. PROCEDURE: Informed written consent was obtained from the patient after a discussion of the risks, benefits, and alternatives to treatment. Questions regarding the procedure were encouraged and answered. The skin and external portion of the existing hemodialysis catheter was prepped with chlorhexidine in a sterile fashion, and a sterile drape was applied covering the  operative field. Maximum barrier sterile technique with sterile gowns and gloves were used for the procedure. A timeout was performed prior to the initiation of the procedure. Both lumens of the hemodialysis catheter were cannulated with a stiff Glidewire and advanced to the level of the right atrium. Under intermittent fluoroscopic guidance, the existing dialysis catheter was exchanged for a 12 French, 45 cm vascular sheath with the tip positioned in the right innominate vein, which was inserted over 1 of the glide wires. Central venogram was performed which demonstrated patency of the superior vena cava with brisk antegrade flow. There is mild tortuosity of the central superior vena cava near the cavoatrial junction. Intravascular ultrasound was then utilized to study the central veins. Within the innominate vein, extending into the superior vena cava is an echogenic fibrin sheath surrounding the indwelling catheter track. Balloon venoplasty was then performed of the right innominate vein and superior vena cava with a 10 mm x 8 cm Athletis balloon. Repeat intravascular ultrasound evaluation demonstrated no evidence of vessel rupture, however did demonstrate disruption of the fibrin sheath. Under intravascular ultrasound guidance, the safety glidewire was retracted to the peripheral aspect of the right innominate vein and redirected outside of the fibrin sheath to the normal lumen of the innominate vein and superior vena cava. The tip of the wire was then positioned in the inferior vena cava. The Rosen wire and sheath were removed. A new, 23 cm tunneled hemodialysis catheter was inserted over the indwelling Glidewire. Unfortunately there was the resistance met and the catheter was unable to be advanced beyond the level of the cavoatrial junction into the right atrium. Therefore, the catheter was removed and the sheath was reinserted. Additional balloon venoplasty was performed about the cavoatrial junction and  central SVC with a 14 mm x 4 cm Atlas balloon. The sheath was then again removed and the 23 cm tunneled hemodialysis catheter was inserted without difficulty. The tip was positioned in the right atrium, at the most cavernous location correlated with intravascular ultrasound imaging. The catheter aspirates and flushes normally. The catheter was flushed with appropriate volume heparin dwells. The catheter exit site was secured with a 0-Silk retention sutures. Both lumens were heparinized. A dressing was placed. The patient tolerated the procedure well  without immediate post procedural complication. IMPRESSION: 1. 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 Tortuosity and mild stenosis of the cavoatrial junction. 2. Intravascular ultrasound guided fibrin sheath disruption and repositioning of hemodialysis access, external to the fibrin sheath. 3. Technically successful replacement of 23 cm tunneled hemodialysis catheter with the tip in the right atrium. PLAN: The catheter is ready for immediate use. Ruthann Cancer, MD [facility] Electronically Signed   By: Ruthann Cancer M.D.   On: 04/29/2021 16:05   IR INTRAVASCULAR ULTRASOUND NON CORONARY  Result Date: 04/29/2021 INDICATION: 71 year old female with history of end-stage renal disease and malfunctioning indwelling right internal jugular tunneled hemodialysis catheter. The patient has previously presented for multiple tunneled line exchanges and central venoplasty. EXAM: 1. TUNNELED CENTRAL VENOUS HEMODIALYSIS CATHETER REPLACEMENT WITH FLUOROSCOPIC GUIDANCE 2. Central venogram 3. Intravascular ultrasound 4. Balloon venoplasty of the right innominate vein and superior vena cava. MEDICATIONS: 2 g Ancef, intravenous Moderate (conscious) sedation was employed during this procedure. A total of Versed 0.5 mg and Fentanyl 25 mcg was administered intravenously. Moderate Sedation Time: 42  minutes. The patient's level of consciousness and vital signs were monitored continuously by radiology nursing throughout the procedure under my direct supervision. FLUOROSCOPY TIME:  Seven minutes 42 seconds, 65 mGy CONTRAST:  20 mL Omnipaque 300, intravenous COMPLICATIONS: None immediate. PROCEDURE: Informed written consent was obtained from the patient after a discussion of the risks, benefits, and alternatives to treatment. Questions regarding the procedure were encouraged and answered. The skin and external portion of the existing hemodialysis catheter was prepped with chlorhexidine in a sterile fashion, and a sterile drape was applied covering the operative field. Maximum barrier sterile technique with sterile gowns and gloves were used for the procedure. A timeout was performed prior to the initiation of the procedure. Both lumens of the hemodialysis catheter were cannulated with a stiff Glidewire and advanced to the level of the right atrium. Under intermittent fluoroscopic guidance, the existing dialysis catheter was exchanged for a 12 French, 45 cm vascular sheath with the tip positioned in the right innominate vein, which was inserted over 1 of the glide wires. Central venogram was performed which demonstrated patency of the superior vena cava with brisk antegrade flow. There is mild tortuosity of the central superior vena cava near the cavoatrial junction. Intravascular ultrasound was then utilized to study the central veins. Within the innominate vein, extending into the superior vena cava is an echogenic fibrin sheath surrounding the indwelling catheter track. Balloon venoplasty was then performed of the right innominate vein and superior vena cava with a 10 mm x 8 cm [employer] balloon. Repeat intravascular ultrasound evaluation demonstrated no evidence of vessel rupture, however did demonstrate disruption of the fibrin sheath. Under intravascular ultrasound guidance, the safety glidewire was retracted  to the peripheral aspect of the right innominate vein and redirected outside of the fibrin sheath to the normal lumen of the innominate vein and superior vena cava. The tip of the wire was then positioned in the inferior vena cava. The Rosen wire and sheath were removed. A new, 23 cm tunneled hemodialysis catheter was inserted over the indwelling Glidewire. Unfortunately there was the resistance met and the catheter was unable to be advanced beyond the level of the cavoatrial junction into the right atrium. Therefore, the catheter was removed and the sheath was reinserted. Additional balloon venoplasty was performed about the cavoatrial junction and central SVC with a 14 mm x 4 cm Atlas balloon. The sheath  was then again removed and the 23 cm tunneled hemodialysis catheter was inserted without difficulty. The tip was positioned in the right atrium, at the most cavernous location correlated with intravascular ultrasound imaging. The catheter aspirates and flushes normally. The catheter was flushed with appropriate volume heparin dwells. The catheter exit site was secured with a 0-Silk retention sutures. Both lumens were heparinized. A dressing was placed. The patient tolerated the procedure well without immediate post procedural complication. IMPRESSION: 1. 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 Tortuosity and mild stenosis of the cavoatrial junction. 2. Intravascular ultrasound guided fibrin sheath disruption and repositioning of hemodialysis access, external to the fibrin sheath. 3. Technically successful replacement of 23 cm tunneled hemodialysis catheter with the tip in the right atrium. PLAN: The catheter is ready for immediate use. Ruthann Cancer, MD [facility] Electronically Signed   By: Ruthann Cancer M.D.   On: 04/29/2021 16:05

## 2021-04-30 NOTE — Progress Notes (Signed)
PT Cancellation & Discharge Note ? ?Patient Details ?Name: Kelli [name] ?MRN: [id number] ?DOB: [birth year] ? ? ?Cancelled Treatment:    Reason Eval/Treat Not Completed: PT screened, no needs identified, will sign off. OT reporting pt is at her baseline, requiring TA for bed level mobility and extensive physical assistance for pivot transfers. OT also reporting husband is deciding to take pt home and pt will not participate in [facility] therapies. Family may benefit from [facility] aide to reduce burden of care. No acute PT needs identified, will sign off.  ? ? ?Kelli [name], PT, DPT ?Acute Rehabilitation Services  ?Pager: [phone] ?Office: [phone] ? ? ? ?Kelli [name] ?04/30/2021, 9:58 AM ? ? ?

## 2021-04-30 NOTE — Progress Notes (Signed)
Subjective: No complaints.  Objective: Vital signs in last 24 hours: Temp:  [97.8 F (36.6 C)-99.5 F (37.5 C)] 98.2 F (36.8 C) (03/02 1116) Pulse Rate:  [77-110] 101 (03/02 1116) Resp:  [14-23] 20 (03/02 1116) BP: (80-135)/(46-99) 132/67 (03/02 1116) SpO2:  [91 %-100 %] 97 % (03/02 1116) Weight:  [85.7 kg] 85.7 kg (03/01 1546) Last BM Date : 04/30/21  Intake/Output from previous day: 03/01 0701 - 03/02 0700 In: 1388 [P.O.:120; I.V.:66.3; Blood:1039.7; IV Piggyback:162] Out: 3000  Intake/Output this shift: No intake/output data recorded.  General appearance: alert and fatigued Resp: rhonchi Cardio: regular rate and rhythm, S1, S2 normal, no murmur, click, rub or gallop GI: soft, non-tender; bowel sounds normal; no masses,  no organomegaly Extremities: extremities normal, atraumatic, no cyanosis or edema  Lab Results: Recent Labs    04/29/21 0555 04/29/21 2000 04/29/21 2153 04/30/21 0146  WBC 9.2  --  11.9* 10.7*  HGB 5.9* 8.3* 7.3* 8.3*  HCT 20.3* 26.7* 23.3* 26.9*  PLT 232  --  218 245   BMET Recent Labs    04/28/21 1303 04/29/21 0555 04/30/21 0146  NA 139 137 138  K 3.9 4.2 4.1  CL 103 99 98  CO2 23 23 25   GLUCOSE 144* 72 109*  BUN 38* 44* 19  CREATININE 5.80* 6.73* 3.71*  CALCIUM 6.7* 6.7* 7.6*   LFT Recent Labs    04/30/21 0146  PROT 5.8*  ALBUMIN 2.4*  AST 11*  ALT 10  ALKPHOS 78  BILITOT 0.9   PT/INR No results for input(s): LABPROT, INR in the last 72 hours. Hepatitis Panel Recent Labs    04/29/21 0955  HEPBSAG NON REACTIVE   C-Diff No results for input(s): CDIFFTOX in the last 72 hours. Fecal Lactopherrin No results for input(s): FECLLACTOFRN in the last 72 hours.  Studies/Results: IR Venocavagram Svc  Result Date: 04/29/2021 INDICATION: 71 year old female with history of end-stage renal disease and malfunctioning indwelling right internal jugular tunneled hemodialysis catheter. The patient has previously presented for  multiple tunneled line exchanges and central venoplasty. EXAM: 1. TUNNELED CENTRAL VENOUS HEMODIALYSIS CATHETER REPLACEMENT WITH FLUOROSCOPIC GUIDANCE 2. Central venogram 3. Intravascular ultrasound 4. Balloon venoplasty of the right innominate vein and superior vena cava. MEDICATIONS: 2 g Ancef, intravenous Moderate (conscious) sedation was employed during this procedure. A total of Versed 0.5 mg and Fentanyl 25 mcg was administered intravenously. Moderate Sedation Time: 42 minutes. The patient's level of consciousness and vital signs were monitored continuously by radiology nursing throughout the procedure under my direct supervision. FLUOROSCOPY TIME:  Seven minutes 42 seconds, 65 mGy CONTRAST:  20 mL Omnipaque 300, intravenous COMPLICATIONS: None immediate. PROCEDURE: Informed written consent was obtained from the patient after a discussion of the risks, benefits, and alternatives to treatment. Questions regarding the procedure were encouraged and answered. The skin and external portion of the existing hemodialysis catheter was prepped with chlorhexidine in a sterile fashion, and a sterile drape was applied covering the operative field. Maximum barrier sterile technique with sterile gowns and gloves were used for the procedure. A timeout was performed prior to the initiation of the procedure. Both lumens of the hemodialysis catheter were cannulated with a stiff Glidewire and advanced to the level of the right atrium. Under intermittent fluoroscopic guidance, the existing dialysis catheter was exchanged for a 12 French, 45 cm vascular sheath with the tip positioned in the right innominate vein, which was inserted over 1 of the glide wires. Central venogram was performed which demonstrated patency of the  superior vena cava with brisk antegrade flow. There is mild tortuosity of the central superior vena cava near the cavoatrial junction. Intravascular ultrasound was then utilized to study the central veins. Within  the innominate vein, extending into the superior vena cava is an echogenic fibrin sheath surrounding the indwelling catheter track. Balloon venoplasty was then performed of the right innominate vein and superior vena cava with a 10 mm x 8 cm Athletis balloon. Repeat intravascular ultrasound evaluation demonstrated no evidence of vessel rupture, however did demonstrate disruption of the fibrin sheath. Under intravascular ultrasound guidance, the safety glidewire was retracted to the peripheral aspect of the right innominate vein and redirected outside of the fibrin sheath to the normal lumen of the innominate vein and superior vena cava. The tip of the wire was then positioned in the inferior vena cava. The Rosen wire and sheath were removed. A new, 23 cm tunneled hemodialysis catheter was inserted over the indwelling Glidewire. Unfortunately there was the resistance met and the catheter was unable to be advanced beyond the level of the cavoatrial junction into the right atrium. Therefore, the catheter was removed and the sheath was reinserted. Additional balloon venoplasty was performed about the cavoatrial junction and central SVC with a 14 mm x 4 cm Atlas balloon. The sheath was then again removed and the 23 cm tunneled hemodialysis catheter was inserted without difficulty. The tip was positioned in the right atrium, at the most cavernous location correlated with intravascular ultrasound imaging. The catheter aspirates and flushes normally. The catheter was flushed with appropriate volume heparin dwells. The catheter exit site was secured with a 0-Silk retention sutures. Both lumens were heparinized. A dressing was placed. The patient tolerated the procedure well without immediate post procedural complication. IMPRESSION: 1. 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 Tortuosity and mild stenosis of the cavoatrial junction. 2. Intravascular ultrasound guided fibrin sheath disruption and  repositioning of hemodialysis access, external to the fibrin sheath. 3. Technically successful replacement of 23 cm tunneled hemodialysis catheter with the tip in the right atrium. PLAN: The catheter is ready for immediate use. Kelli Cancer, MD [facility] Electronically Signed   By: Kelli [name] M.D.   On: 04/29/2021 16:05   IR Fluoro Guide CV Line Right  Result Date: 04/29/2021 INDICATION: 71 year old female with history of end-stage renal disease and malfunctioning indwelling right internal jugular tunneled hemodialysis catheter. The patient has previously presented for multiple tunneled line exchanges and central venoplasty. EXAM: 1. TUNNELED CENTRAL VENOUS HEMODIALYSIS CATHETER REPLACEMENT WITH FLUOROSCOPIC GUIDANCE 2. Central venogram 3. Intravascular ultrasound 4. Balloon venoplasty of the right innominate vein and superior vena cava. MEDICATIONS: 2 g Ancef, intravenous Moderate (conscious) sedation was employed during this procedure. A total of Versed 0.5 mg and Fentanyl 25 mcg was administered intravenously. Moderate Sedation Time: 42 minutes. The patient's level of consciousness and vital signs were monitored continuously by radiology nursing throughout the procedure under my direct supervision. FLUOROSCOPY TIME:  Seven minutes 42 seconds, 65 mGy CONTRAST:  20 mL Omnipaque 300, intravenous COMPLICATIONS: None immediate. PROCEDURE: Informed written consent was obtained from the patient after a discussion of the risks, benefits, and alternatives to treatment. Questions regarding the procedure were encouraged and answered. The skin and external portion of the existing hemodialysis catheter was prepped with chlorhexidine in a sterile fashion, and a sterile drape was applied covering the operative field. Maximum barrier sterile technique with sterile gowns and gloves were used for the procedure. A timeout was  performed prior to the initiation of the  procedure. Both lumens of the hemodialysis catheter were cannulated with a stiff Glidewire and advanced to the level of the right atrium. Under intermittent fluoroscopic guidance, the existing dialysis catheter was exchanged for a 12 French, 45 cm vascular sheath with the tip positioned in the right innominate vein, which was inserted over 1 of the glide wires. Central venogram was performed which demonstrated patency of the superior vena cava with brisk antegrade flow. There is mild tortuosity of the central superior vena cava near the cavoatrial junction. Intravascular ultrasound was then utilized to study the central veins. Within the innominate vein, extending into the superior vena cava is an echogenic fibrin sheath surrounding the indwelling catheter track. Balloon venoplasty was then performed of the right innominate vein and superior vena cava with a 10 mm x 8 cm Athletis balloon. Repeat intravascular ultrasound evaluation demonstrated no evidence of vessel rupture, however did demonstrate disruption of the fibrin sheath. Under intravascular ultrasound guidance, the safety glidewire was retracted to the peripheral aspect of the right innominate vein and redirected outside of the fibrin sheath to the normal lumen of the innominate vein and superior vena cava. The tip of the wire was then positioned in the inferior vena cava. The Rosen wire and sheath were removed. A new, 23 cm tunneled hemodialysis catheter was inserted over the indwelling Glidewire. Unfortunately there was the resistance met and the catheter was unable to be advanced beyond the level of the cavoatrial junction into the right atrium. Therefore, the catheter was removed and the sheath was reinserted. Additional balloon venoplasty was performed about the cavoatrial junction and central SVC with a 14 mm x 4 cm Atlas balloon. The sheath was then again removed and the 23 cm tunneled hemodialysis catheter was inserted without difficulty. The tip  was positioned in the right atrium, at the most cavernous location correlated with intravascular ultrasound imaging. The catheter aspirates and flushes normally. The catheter was flushed with appropriate volume heparin dwells. The catheter exit site was secured with a 0-Silk retention sutures. Both lumens were heparinized. A dressing was placed. The patient tolerated the procedure well without immediate post procedural complication. IMPRESSION: 1. 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 Tortuosity and mild stenosis of the cavoatrial junction. 2. Intravascular ultrasound guided fibrin sheath disruption and repositioning of hemodialysis access, external to the fibrin sheath. 3. Technically successful replacement of 23 cm tunneled hemodialysis catheter with the tip in the right atrium. PLAN: The catheter is ready for immediate use. Kelli Cancer, MD [facility] Radiology Electronically Signed   By: Kelli [name] M.D.   On: 04/29/2021 16:05   DG Chest Port 1 View  Result Date: 04/29/2021 CLINICAL DATA:  Shortness of breath, confusion, missed dialysis for 1 week, history hypertension, diabetes mellitus, stroke, end-stage renal disease EXAM: PORTABLE CHEST 1 VIEW COMPARISON:  Portable exam 1537 hours compared to 04/28/2021 FINDINGS: RIGHT jugular line with tip projecting over RIGHT atrium. Enlargement of cardiac silhouette with pulmonary vascular congestion. Loop recorder versus leadless pacemaker projecting over LEFT heart. Atherosclerotic calcification aorta. RIGHT basilar atelectasis. Atelectasis versus consolidation LEFT lower lobe. No definite pleural effusion or pneumothorax. Vascular stents in the upper arms bilaterally. IMPRESSION: Atelectasis versus consolidation at LEFT lung base with mild RIGHT basilar atelectasis. Enlargement of cardiac silhouette with pulmonary vascular congestion. Aortic Atherosclerosis (ICD10-I70.0).  Electronically Signed   By: Lavonia Dana M.D.   On: 04/29/2021 15:53   IR PTA VENOUS EXCEPT  DIALYSIS CIRCUIT  Result Date: 04/29/2021 INDICATION: 71 year old female with history of end-stage renal disease and malfunctioning indwelling right internal jugular tunneled hemodialysis catheter. The patient has previously presented for multiple tunneled line exchanges and central venoplasty. EXAM: 1. TUNNELED CENTRAL VENOUS HEMODIALYSIS CATHETER REPLACEMENT WITH FLUOROSCOPIC GUIDANCE 2. Central venogram 3. Intravascular ultrasound 4. Balloon venoplasty of the right innominate vein and superior vena cava. MEDICATIONS: 2 g Ancef, intravenous Moderate (conscious) sedation was employed during this procedure. A total of Versed 0.5 mg and Fentanyl 25 mcg was administered intravenously. Moderate Sedation Time: 42 minutes. The patient's level of consciousness and vital signs were monitored continuously by radiology nursing throughout the procedure under my direct supervision. FLUOROSCOPY TIME:  Seven minutes 42 seconds, 65 mGy CONTRAST:  20 mL Omnipaque 300, intravenous COMPLICATIONS: None immediate. PROCEDURE: Informed written consent was obtained from the patient after a discussion of the risks, benefits, and alternatives to treatment. Questions regarding the procedure were encouraged and answered. The skin and external portion of the existing hemodialysis catheter was prepped with chlorhexidine in a sterile fashion, and a sterile drape was applied covering the operative field. Maximum barrier sterile technique with sterile gowns and gloves were used for the procedure. A timeout was performed prior to the initiation of the procedure. Both lumens of the hemodialysis catheter were cannulated with a stiff Glidewire and advanced to the level of the right atrium. Under intermittent fluoroscopic guidance, the existing dialysis catheter was exchanged for a 12 French, 45 cm vascular sheath with the tip positioned in the right  innominate vein, which was inserted over 1 of the glide wires. Central venogram was performed which demonstrated patency of the superior vena cava with brisk antegrade flow. There is mild tortuosity of the central superior vena cava near the cavoatrial junction. Intravascular ultrasound was then utilized to study the central veins. Within the innominate vein, extending into the superior vena cava is an echogenic fibrin sheath surrounding the indwelling catheter track. Balloon venoplasty was then performed of the right innominate vein and superior vena cava with a 10 mm x 8 cm Athletis balloon. Repeat intravascular ultrasound evaluation demonstrated no evidence of vessel rupture, however did demonstrate disruption of the fibrin sheath. Under intravascular ultrasound guidance, the safety glidewire was retracted to the peripheral aspect of the right innominate vein and redirected outside of the fibrin sheath to the normal lumen of the innominate vein and superior vena cava. The tip of the wire was then positioned in the inferior vena cava. The Rosen wire and sheath were removed. A new, 23 cm tunneled hemodialysis catheter was inserted over the indwelling Glidewire. Unfortunately there was the resistance met and the catheter was unable to be advanced beyond the level of the cavoatrial junction into the right atrium. Therefore, the catheter was removed and the sheath was reinserted. Additional balloon venoplasty was performed about the cavoatrial junction and central SVC with a 14 mm x 4 cm Atlas balloon. The sheath was then again removed and the 23 cm tunneled hemodialysis catheter was inserted without difficulty. The tip was positioned in the right atrium, at the most cavernous location correlated with intravascular ultrasound imaging. The catheter aspirates and flushes normally. The catheter was flushed with appropriate volume heparin dwells. The catheter exit site was secured with a 0-Silk retention sutures. Both  lumens were heparinized. A dressing was placed. The patient tolerated the procedure well without immediate post procedural complication. IMPRESSION: 1. 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 Tortuosity and mild stenosis of the  cavoatrial junction. 2. Intravascular ultrasound guided fibrin sheath disruption and repositioning of hemodialysis access, external to the fibrin sheath. 3. Technically successful replacement of 23 cm tunneled hemodialysis catheter with the tip in the right atrium. PLAN: The catheter is ready for immediate use. Kelli Cancer, MD [facility] Radiology Electronically Signed   By: Kelli [name] M.D.   On: 04/29/2021 16:05   IR INTRAVASCULAR ULTRASOUND NON CORONARY  Result Date: 04/29/2021 INDICATION: 71 year old female with history of end-stage renal disease and malfunctioning indwelling right internal jugular tunneled hemodialysis catheter. The patient has previously presented for multiple tunneled line exchanges and central venoplasty. EXAM: 1. TUNNELED CENTRAL VENOUS HEMODIALYSIS CATHETER REPLACEMENT WITH FLUOROSCOPIC GUIDANCE 2. Central venogram 3. Intravascular ultrasound 4. Balloon venoplasty of the right innominate vein and superior vena cava. MEDICATIONS: 2 g Ancef, intravenous Moderate (conscious) sedation was employed during this procedure. A total of Versed 0.5 mg and Fentanyl 25 mcg was administered intravenously. Moderate Sedation Time: 42 minutes. The patient's level of consciousness and vital signs were monitored continuously by radiology nursing throughout the procedure under my direct supervision. FLUOROSCOPY TIME:  Seven minutes 42 seconds, 65 mGy CONTRAST:  20 mL Omnipaque 300, intravenous COMPLICATIONS: None immediate. PROCEDURE: Informed written consent was obtained from the patient after a discussion of the risks, benefits, and alternatives to treatment. Questions regarding the procedure were  encouraged and answered. The skin and external portion of the existing hemodialysis catheter was prepped with chlorhexidine in a sterile fashion, and a sterile drape was applied covering the operative field. Maximum barrier sterile technique with sterile gowns and gloves were used for the procedure. A timeout was performed prior to the initiation of the procedure. Both lumens of the hemodialysis catheter were cannulated with a stiff Glidewire and advanced to the level of the right atrium. Under intermittent fluoroscopic guidance, the existing dialysis catheter was exchanged for a 12 French, 45 cm vascular sheath with the tip positioned in the right innominate vein, which was inserted over 1 of the glide wires. Central venogram was performed which demonstrated patency of the superior vena cava with brisk antegrade flow. There is mild tortuosity of the central superior vena cava near the cavoatrial junction. Intravascular ultrasound was then utilized to study the central veins. Within the innominate vein, extending into the superior vena cava is an echogenic fibrin sheath surrounding the indwelling catheter track. Balloon venoplasty was then performed of the right innominate vein and superior vena cava with a 10 mm x 8 cm Athletis balloon. Repeat intravascular ultrasound evaluation demonstrated no evidence of vessel rupture, however did demonstrate disruption of the fibrin sheath. Under intravascular ultrasound guidance, the safety glidewire was retracted to the peripheral aspect of the right innominate vein and redirected outside of the fibrin sheath to the normal lumen of the innominate vein and superior vena cava. The tip of the wire was then positioned in the inferior vena cava. The Rosen wire and sheath were removed. A new, 23 cm tunneled hemodialysis catheter was inserted over the indwelling Glidewire. Unfortunately there was the resistance met and the catheter was unable to be advanced beyond the level of the  cavoatrial junction into the right atrium. Therefore, the catheter was removed and the sheath was reinserted. Additional balloon venoplasty was performed about the cavoatrial junction and central SVC with a 14 mm x 4 cm Atlas balloon. The sheath was then again removed and the 23 cm tunneled hemodialysis catheter was inserted without difficulty. The tip was positioned in the  right atrium, at the most cavernous location correlated with intravascular ultrasound imaging. The catheter aspirates and flushes normally. The catheter was flushed with appropriate volume heparin dwells. The catheter exit site was secured with a 0-Silk retention sutures. Both lumens were heparinized. A dressing was placed. The patient tolerated the procedure well without immediate post procedural complication. IMPRESSION: 1. 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 Tortuosity and mild stenosis of the cavoatrial junction. 2. Intravascular ultrasound guided fibrin sheath disruption and repositioning of hemodialysis access, external to the fibrin sheath. 3. Technically successful replacement of 23 cm tunneled hemodialysis catheter with the tip in the right atrium. PLAN: The catheter is ready for immediate use. Kelli Cancer, MD [facility] Radiology Electronically Signed   By: Kelli [name] M.D.   On: 04/29/2021 16:05    Medications: Scheduled:  atorvastatin  40 mg Oral QPM   calcium acetate  1,334 mg Oral TID WC   Chlorhexidine Gluconate Cloth  6 each Topical Q0600   dextrose  50 mL Intravenous Once   insulin aspart  0-6 Units Subcutaneous TID WC   loperamide  4 mg Oral Once   metoprolol succinate  100 mg Oral QPM   pantoprazole (PROTONIX) IV  40 mg Intravenous Q12H   venlafaxine XR  150 mg Oral Daily   Continuous:  sodium chloride     sodium chloride     sodium chloride     sodium chloride      Assessment/Plan: 1) Anemia. 2) CHF. 3) ESRD with  fluid overload - improving.   She is saturating well with minimal to off of O2, but clinically she does not appear well.  Her lung fields demonstrate rhonchi.  HGB is stable.  Plan: 1) Continue with pantoprazole. 2) She will be reassessed tomorrow.  At some point she will need an EGD/colonoscopy.  LOS: 1 day   Porche Steinberger D 04/30/2021, 1:32 PM

## 2021-04-30 NOTE — Progress Notes (Signed)
?  Kelli [name] ?Progress Note  ? ?Assessment/ Plan:   ?Outpatient HD prescription:  [facility], TTS, EDW 74.5kg, Revaclr Dialyzer  Time 3:45, Bath 3K/2.5Ca, Access RIJ TDC, BFR 350, DFR 500, Heparin 1500 units bolus then 1200 units/hr ?  ?Micera 150 mcg IVP every 2 weeks, calcitriol 1 mcg qTTS,  ?  ?Assessment/Plan: ?  ?Poorly functioning TDC - exchanged 04/29/21  ?ESRD - normally TTS, HD yesterday, plan for short run today to get back on schedule ?Acute on chronic anemia - received blood transfusion and follow.  Hgb was 9.2 on 03/24/21 ?HTN/Volume - was SOB in ED and requiring supplemental oxygen.  Will UF as tolerated and follow. Covid/flu negative. ?Acute hypoxic respiratory failure - UF as tolerated ?Atrial fibrillation - Eliquis on hold for procedure ?DM type 2 - per primary ?Disposition - pending ?  ? ?Subjective:   ? ?Seen in room.  Husband is with her.  Had HD yesterday after TDC exchange.  Still sounds very junky and rhonchrous.    ? ?Objective:   ?BP 132/67 (BP Location: Left Arm)   Pulse (!) 101   Temp 98.2 ?F (36.8 ?C) (Oral)   Resp 20   Ht 5\' 5"  (1.651 m)   Wt 85.7 kg   SpO2 97%   BMI 31.44 kg/m?  ? ?Physical Exam: ?Gen: chronically ill-appearing, lying in bed ?CVS: RRR ?Resp: rhonchrous bilaterally with upper airway sounds ?Abd: soft ?Ext: 1+ LUE edema, L sided weakness ? ?Labs: ?BMET ?Recent Labs  ?Lab 04/28/21 ?1303 04/29/21 ?3151 04/30/21 ?0146  ?NA 139 137 138  ?K 3.9 4.2 4.1  ?CL 103 99 98  ?CO2 23 23 25   ?GLUCOSE 144* 72 109*  ?BUN 38* 44* 19  ?CREATININE 5.80* 6.73* 3.71*  ?CALCIUM 6.7* 6.7* 7.6*  ? ?CBC ?Recent Labs  ?Lab 04/28/21 ?1303 04/29/21 ?7616 04/29/21 ?2000 04/29/21 ?2153 04/30/21 ?0146  ?WBC 15.2* 9.2  --  11.9* 10.7*  ?NEUTROABS 12.2*  --   --   --  8.2*  ?HGB 7.0* 5.9* 8.3* 7.3* 8.3*  ?HCT 23.7* 20.3* 26.7* 23.3* 26.9*  ?MCV 94.0 93.1  --  88.6 90.6  ?PLT 270 232  --  218 245  ? ? ?  ?Medications:   ? ? atorvastatin  40 mg Oral QPM  ? calcium acetate  1,334 mg  Oral TID WC  ? Chlorhexidine Gluconate Cloth  6 each Topical Q0600  ? dextrose  50 mL Intravenous Once  ? insulin aspart  0-6 Units Subcutaneous TID WC  ? metoprolol succinate  100 mg Oral QPM  ? pantoprazole (PROTONIX) IV  40 mg Intravenous Q12H  ? venlafaxine XR  150 mg Oral Daily  ? ? ? ?Madelon Lips, MD ?04/30/2021, 11:35 AM   ?

## 2021-04-30 NOTE — Evaluation (Addendum)
Occupational Therapy Evaluation/Discharge ?Patient Details ?Name: Kelli [name] ?MRN: [id number] ?DOB: [birth year] ?Today's Date: 04/30/2021 ? ? ?History of Present Illness Kelli [name] is a 71 y.o. female who presented from HD facility due to HD cath malfunction. Underwent HD cath replacement on 3/1. PMH:  ESRD on HD TTS, prior CVA, type 2 diabetes, chronic diastolic CHF, atrial fibrillation, essential hypertension, dyslipidemia, and anxiety  ? ?Clinical Impression ?  ?PTA, pt lives with spouse who assists with all bathing, dressing, and toileting tasks. Pt's spouse also provides significant assist for pivot transfers to/from wheelchair, [facility] and shower chair at home. Confirmed current functional abilities similar to previous abilities with spouse this AM. Pt requires Max A-Total A to roll in bed, able to perform simple tasks like self feeding but also with hx of decreased motivation to improve independence in other ADL areas. Spouse declines need for therapy services at acute level or at home as pt will not participate well. Spouse declines SNF placement, opting to take pt home instead. Recommended ADL completion bed level (including bedpan use) prn on pt's "bad days" to prevent caregiver/pt injury. Pt reports hoyer lift will not fit in hallways at home, open to possibility of [facility] aide assist if available and works with their routine - CM/RN aware. OT to sign off at acute level. ? ?SpO2 97% on 2 L O2, gradual desat to 88% on RA in bed but rebounds when 2 L O2 replaced. Pt does not wear O2 at baseline.  ?   ? ?Recommendations for follow up therapy are one component of a multi-disciplinary discharge planning process, led by the attending physician.  Recommendations may be updated based on patient status, additional functional criteria and insurance authorization.  ? ?Follow Up Recommendations ? No OT follow up (pt's spouse reports pt will not participate well with HH therapies)  ?  ?Assistance Recommended at Discharge  Frequent or constant Supervision/Assistance  ?Patient can return home with the following A lot of help with walking and/or transfers;A lot of help with bathing/dressing/bathroom ? ?  ?Functional Status Assessment ? Patient has not had a recent decline in their functional status  ?Equipment Recommendations ? None recommended by OT (reports a hoyer lift will not fit in their home)  ?  ?Recommendations for Other Services   ? ? ?  ?Precautions / Restrictions Precautions ?Precautions: Fall;Other (comment) ?Precaution Comments: monitor O2 ?Restrictions ?Weight Bearing Restrictions: No  ? ?  ? ?Mobility Bed Mobility ?Overal bed mobility: Needs Assistance ?Bed Mobility: Rolling ?Rolling: Max assist, Total assist ?  ?  ?  ?  ?General bed mobility comments: Max A to roll to L side, Total A to roll to R side. poor initiation, difficulty motor planning and sequencing task. Declined to attempt sitting EOB ?  ? ?Transfers ?  ?  ?  ?  ?  ?  ?  ?  ?  ?General transfer comment: deferred ?  ? ?  ?Balance   ?  ?  ?  ?  ?  ?  ?  ?  ?  ?  ?  ?  ?  ?  ?  ?  ?  ?  ?   ? ?ADL either performed or assessed with clinical judgement  ? ?ADL Overall ADL's : At baseline ?  ?  ?  ?  ?  ?  ?  ?  ?  ?  ?  ?  ?  ?  ?  ?  ?  ?  ?  ?   ? ? ? ?  Vision Ability to See in Adequate Light: 1 Impaired ?Patient Visual Report: No change from baseline ?Vision Assessment?: No apparent visual deficits  ?   ?Perception   ?  ?Praxis   ?  ? ?Pertinent Vitals/Pain Pain Assessment ?Pain Assessment: No/denies pain  ? ? ? ?Hand Dominance Right ?  ?Extremity/Trunk Assessment Upper Extremity Assessment ?Upper Extremity Assessment: LUE deficits/detail ?LUE Deficits / Details: hx of extensor tone, spasticity. AAROM WFL ?LUE Coordination: decreased gross motor ?  ?Lower Extremity Assessment ?Lower Extremity Assessment: Defer to PT evaluation ?  ?  ?  ?Communication Communication ?Communication: Expressive difficulties (hx of dysarthria) ?  ?Cognition Arousal/Alertness:  Awake/alert ?Behavior During Therapy: Flat affect ?Overall Cognitive Status: History of cognitive impairments - at baseline ?  ?  ?  ?  ?  ?  ?  ?  ?  ?  ?  ?  ?  ?  ?  ?  ?General Comments: Pt flat affect, poor historian and reporting incorrect PLOF. Able to state it is march, why she is at hospital. Slow processing and initiation ?  ?  ?General Comments  Spoke with pt's husband who arrived after session. Discussed ADLs bed level (bed pan use as well), hoyer lift (though will not fit through hallway), aide assist to decrease caregiver burden as husband reports having Parkinson's (moves well) ? ?  ?Exercises   ?  ?Shoulder Instructions    ? ? ?Home Living Family/patient expects to be discharged to:: Private residence ?Living Arrangements: Spouse/significant other ?Available Help at Discharge: Family;Available 24 hours/day ?Type of Home: House ?Home Access: Ramped entrance ?  ?  ?Home Layout: Two level;Able to live on main level with bedroom/bathroom ?  ?  ?Bathroom Shower/Tub: Walk-in shower ?  ?Bathroom Toilet: Standard ?Bathroom Accessibility: Yes ?  ?Home Equipment: Wheelchair - manual;Shower seat;BSC/3in1;Hospital bed ?  ?  ?  ? ?  ?Prior Functioning/Environment Prior Level of Function : Needs assist ?  ?  ?  ?Physical Assist : ADLs (physical);Mobility (physical) ?Mobility (physical): Bed mobility;Transfers ?ADLs (physical): Dressing;Bathing;Toileting;IADLs ?Mobility Comments: Pt reports ambulatory at home without device. Per husband and previous admissions, requires heavy assist to transfer to/from wheelchair, [facility] and shower chair ?ADLs Comments: Able to self feed and complete basic grooming tasks. Total A for bathing, dressing, and toileting. Husband assists with showering at least once a week and daily bed baths, BSC transfers though pt experiences incontinence at times requiring cleanup bed level ?  ? ?  ?  ?OT Problem List:   ?  ?   ?OT Treatment/Interventions:    ?  ?OT Goals(Current goals can be found in  the care plan section) Acute Rehab OT Goals ?Patient Stated Goal: husband would like to take pt home ?OT Goal Formulation: All assessment and education complete, DC therapy  ?OT Frequency:   ?  ? ?Co-evaluation   ?  ?  ?  ?  ? ?  ?AM-PAC OT "6 Clicks" Daily Activity     ?Outcome Measure Help from another person eating meals?: A Little ?Help from another person taking care of personal grooming?: A Little ?Help from another person toileting, which includes using toliet, bedpan, or urinal?: Total ?Help from another person bathing (including washing, rinsing, drying)?: A Lot ?Help from another person to put on and taking off regular upper body clothing?: A Lot ?Help from another person to put on and taking off regular lower body clothing?: Total ?6 Click Score: 12 ?  ?End of Session Equipment Utilized During Treatment:  Oxygen ?Nurse Communication: Mobility status ? ?Activity Tolerance: Patient limited by fatigue ?Patient left: in bed;with call bell/phone within reach;with bed alarm set;with family/visitor present ? ?OT Visit Diagnosis: Muscle weakness (generalized) (M62.81)  ?              ?Time: 8182-9937 ?OT Time Calculation (min): 23 min ?Charges:  OT General Charges ?$OT Visit: 1 Visit ?OT Evaluation ?$OT Eval Moderate Complexity: 1 Mod ? ?Almyra Free B, OTR/L ?Acute Rehab Services ?Office: [phone]  ? ?Layla Maw ?04/30/2021, 8:46 AM ?

## 2021-05-01 ENCOUNTER — Inpatient Hospital Stay (HOSPITAL_COMMUNITY): Payer: Medicare Other

## 2021-05-01 DIAGNOSIS — N186 End stage renal disease: Secondary | ICD-10-CM | POA: Diagnosis not present

## 2021-05-01 DIAGNOSIS — I10 Essential (primary) hypertension: Secondary | ICD-10-CM | POA: Diagnosis not present

## 2021-05-01 DIAGNOSIS — E1122 Type 2 diabetes mellitus with diabetic chronic kidney disease: Secondary | ICD-10-CM | POA: Diagnosis not present

## 2021-05-01 DIAGNOSIS — J810 Acute pulmonary edema: Secondary | ICD-10-CM | POA: Diagnosis not present

## 2021-05-01 LAB — CBC WITH DIFFERENTIAL/PLATELET
Abs Immature Granulocytes: 0.05 10*3/uL (ref 0.00–0.07)
Basophils Absolute: 0 10*3/uL (ref 0.0–0.1)
Basophils Relative: 1 %
Eosinophils Absolute: 0.2 10*3/uL (ref 0.0–0.5)
Eosinophils Relative: 2 %
HCT: 25.5 % — ABNORMAL LOW (ref 36.0–46.0)
Hemoglobin: 7.8 g/dL — ABNORMAL LOW (ref 12.0–15.0)
Immature Granulocytes: 1 %
Lymphocytes Relative: 16 %
Lymphs Abs: 1.2 10*3/uL (ref 0.7–4.0)
MCH: 27.8 pg (ref 26.0–34.0)
MCHC: 30.6 g/dL (ref 30.0–36.0)
MCV: 90.7 fL (ref 80.0–100.0)
Monocytes Absolute: 0.9 10*3/uL (ref 0.1–1.0)
Monocytes Relative: 12 %
Neutro Abs: 5.1 10*3/uL (ref 1.7–7.7)
Neutrophils Relative %: 68 %
Platelets: 187 10*3/uL (ref 150–400)
RBC: 2.81 MIL/uL — ABNORMAL LOW (ref 3.87–5.11)
RDW: 17.2 % — ABNORMAL HIGH (ref 11.5–15.5)
WBC: 7.4 10*3/uL (ref 4.0–10.5)
nRBC: 0 % (ref 0.0–0.2)

## 2021-05-01 LAB — COMPREHENSIVE METABOLIC PANEL
ALT: 6 U/L (ref 0–44)
AST: 25 U/L (ref 15–41)
Albumin: 2.3 g/dL — ABNORMAL LOW (ref 3.5–5.0)
Alkaline Phosphatase: 74 U/L (ref 38–126)
Anion gap: 10 (ref 5–15)
BUN: 12 mg/dL (ref 8–23)
CO2: 25 mmol/L (ref 22–32)
Calcium: 7.8 mg/dL — ABNORMAL LOW (ref 8.9–10.3)
Chloride: 97 mmol/L — ABNORMAL LOW (ref 98–111)
Creatinine, Ser: 2.5 mg/dL — ABNORMAL HIGH (ref 0.44–1.00)
GFR, Estimated: 20 mL/min — ABNORMAL LOW (ref >60)
Glucose, Bld: 110 mg/dL — ABNORMAL HIGH (ref 70–99)
Potassium: 4 mmol/L (ref 3.5–5.1)
Sodium: 132 mmol/L — ABNORMAL LOW (ref 135–145)
Total Bilirubin: 1.8 mg/dL — ABNORMAL HIGH (ref 0.3–1.2)
Total Protein: 5.7 g/dL — ABNORMAL LOW (ref 6.5–8.1)

## 2021-05-01 LAB — MAGNESIUM: Magnesium: 1.9 mg/dL (ref 1.7–2.4)

## 2021-05-01 LAB — MRSA NEXT GEN BY PCR, NASAL: MRSA by PCR Next Gen: NOT DETECTED

## 2021-05-01 LAB — GLUCOSE, CAPILLARY
Glucose-Capillary: 112 mg/dL — ABNORMAL HIGH (ref 70–99)
Glucose-Capillary: 117 mg/dL — ABNORMAL HIGH (ref 70–99)
Glucose-Capillary: 112 mg/dL — ABNORMAL HIGH (ref 70–99)

## 2021-05-01 LAB — BRAIN NATRIURETIC PEPTIDE: B Natriuretic Peptide: 1332.2 pg/mL — ABNORMAL HIGH (ref 0.0–100.0)

## 2021-05-01 LAB — HEPATITIS B SURFACE ANTIBODY, QUANTITATIVE: Hep B S AB Quant (Post): <3.1 m[IU]/mL — ABNORMAL LOW (ref >9.9)

## 2021-05-01 MED ORDER — PEG 3350-KCL-NA BICARB-NACL 420 G PO SOLR
4000.0000 mL | Freq: Once | ORAL | Status: AC
  Administered 2021-05-01: 4000 mL via ORAL
  Filled 2021-05-01 (×2): qty 4000

## 2021-05-01 NOTE — Care Management Important Message (Signed)
Important Message ? ?Patient Details  ?Name: Kelli [name] ?MRN: [id number] ?Date of Birth: February 02, 1951 ? ? ?Medicare Important Message Given:  Yes ? ? ? ? ?[name] ?05/01/2021, 10:36 AM ?

## 2021-05-01 NOTE — Progress Notes (Signed)
Patient has been unable to drink Go-lytely prep . Also patient has history of having Nectar thick liq. With soft diet. Tim R.N. aware and Dr Benson Norway text page via [place]. Call not return. Paged Dr. Benson Norway via answering service at [facility] call return and M.D. made aware of the above . Cancel bowel prep per Dr. Benson Norway orders.Will keep NPO for E.G.D. in a.m. ?

## 2021-05-01 NOTE — Progress Notes (Signed)
?  [facility] ?Progress Note  ? ?Assessment/ Plan:   ?Outpatient HD prescription:  [facility], TTS, EDW 74.5kg, Revaclr Dialyzer  Time 3:45, Bath 3K/2.5Ca, Access RIJ TDC, BFR 350, DFR 500, Heparin 1500 units bolus then 1200 units/hr ?  ?Micera 150 mcg IVP every 2 weeks, calcitriol 1 mcg qTTS,  ?  ?Assessment/Plan: ?  ?Poorly functioning TDC - exchanged 04/29/21  ?ESRD - normally TTS, HD 3/1 and 3/2.  Next 05/02/21 ?Acute on chronic anemia - received blood transfusion and follow.  Hgb was 9.2 on 03/24/21  Gi is following- planning for EGD and cscope ?HTN/Volume - was SOB in ED and requiring supplemental oxygen.  Will UF as tolerated and follow. Covid/flu negative. ?Acute hypoxic respiratory failure - UF as tolerated--> improving ?Atrial fibrillation - Eliquis on hold for procedure ?DM type 2 - per primary ?Disposition - pending ?  ? ?Subjective:   ? ?HD yesterday again with about 1L off.  Much better sounding lungs today.    ? ?Objective:   ?BP 134/84 (BP Location: Left Arm)   Pulse 95   Temp 98.4 ?F (36.9 ?C) (Oral)   Resp 14   Ht 5\' 5"  (1.651 m)   Wt 71.9 kg   SpO2 94%   BMI 26.38 kg/m?  ? ?Physical Exam: ?Gen: chronically ill-appearing, lying in bed ?CVS: RRR ?Resp: some bibasilar crackles but overall improved ?Abd: soft ?Ext: 1+ LUE edema, L sided weakness ? ?Labs: ?BMET ?Recent Labs  ?Lab 04/28/21 ?1303 04/29/21 ?2409 04/30/21 ?0146 05/01/21 ?0138  ?NA 139 137 138 132*  ?K 3.9 4.2 4.1 4.0  ?CL 103 99 98 97*  ?CO2 23 23 25 25   ?GLUCOSE 144* 72 109* 110*  ?BUN 38* 44* 19 12  ?CREATININE 5.80* 6.73* 3.71* 2.50*  ?CALCIUM 6.7* 6.7* 7.6* 7.8*  ? ?CBC ?Recent Labs  ?Lab 04/28/21 ?1303 04/29/21 ?7353 04/29/21 ?2153 04/30/21 ?0146 04/30/21 ?1450 05/01/21 ?0138  ?WBC 15.2*   < > 11.9* 10.7* 8.5 7.4  ?NEUTROABS 12.2*  --   --  8.2*  --  5.1  ?HGB 7.0*   < > 7.3* 8.3* 7.7* 7.8*  ?HCT 23.7*   < > 23.3* 26.9* 25.5* 25.5*  ?MCV 94.0   < > 88.6 90.6 92.4 90.7  ?PLT 270   < > 218 245 236 187  ? < > = values  in this interval not displayed.  ? ? ?  ?Medications:   ? ? atorvastatin  40 mg Oral QPM  ? calcium acetate  1,334 mg Oral TID WC  ? Chlorhexidine Gluconate Cloth  6 each Topical Q0600  ? dextrose  50 mL Intravenous Once  ? insulin aspart  0-6 Units Subcutaneous TID WC  ? metoprolol succinate  100 mg Oral QPM  ? pantoprazole (PROTONIX) IV  40 mg Intravenous Q12H  ? venlafaxine XR  150 mg Oral Daily  ? ? ? ?Madelon Lips, MD ?05/01/2021, 11:46 AM   ?

## 2021-05-01 NOTE — Progress Notes (Signed)
PROGRESS NOTE                                                                                                                                                                                                             Patient Demographics:    Kelli [name], is a 71 y.o. female, DOB - [birth year], FTD:[id number]  Outpatient Primary MD for the patient is [name], [name], MD    LOS - 2  Admit date - 04/28/2021    Chief Complaint  Patient presents with   Shortness of Breath       Brief Narrative (HPI from H&P)   71 year old female with history of ESRD on TTS schedule who lives in [place], [place] type II, chronic diastolic CHF, paroxysmal atrial fibrillation [place] vas 2 score of greater than 3 on Eliquis, essential hypertension, dyslipidemia and anxiety.  Patient presented to [facility], ER yesterday after she had issues with her HD dialysis catheter and she could only have 500 cc of fluid removed on 04/28/2021.  At the time of presentation to [facility], ER she was noted to have a hemoglobin of 7, mild leukocytosis, she was admitted for HD catheter exchange.  From [facility] she was transferred to [facility] for HD catheter exchange on 04/29/2021 she was noted to have a hemoglobin of 5.9 this morning at [facility] and a unit of blood transfusion was started, upon arrival to [facility], IR department she was noted to have 1 unit of packed RBC transfusion running, she also had bilateral crackles and had difficulty laying flat.  I was called to evaluate the patient in the IR department for further   Subjective:   Patient in bed, appears comfortable, denies any headache, no fever, no chest pain or pressure, no shortness of breath , no abdominal pain. No new focal weakness.   Assessment  & Plan :    Acute hypoxic respiratory failure due to acute on chronic diastolic CHF from missed dialysis treatment, HD catheter malfunction at [facility], worsened by acute on chronic anemia.  She had her HD catheter is exchanged 04/29/21.  Underwent under urgent HD on 04/29/2021, shortness of breath much improved, renal following, TTS schedule.   ESRD.  On TTS schedule.   TTS schedule renal following  Acute anemia in the presence of anemia of  chronic disease in a patient who is on Eliquis.  No history of melena or blood in stool, she was on Eliquis, no signs of brisk ongoing GI bleed, s/p 1 unit of packed RBC transfusion on 04/29/2021 with stable posttransfusion H&H, on IV PPI, Eliquis on hold GI on board, case discussed with GI physician Dr. Benson Norway on 04/30/2021.  Continue to monitor closely.  Will defer to GI on any further GI work-up including resumption of Eliquis.  Essential hypertension.  On beta-blocker continue and monitor.  Acute on chronic diastolic CHF EF 51% on echocardiogram done in November 2022.  This is due to missed dialysis treatment kindly see plan in #1 above continue beta-blocker for proper diastolic refilling.  Obesity.  BMI 31.  Follow with PCP for weight loss.  Mild metabolic encephalopathy.  Upon talking to the husband it seems like patient is developing some early dementia or age-related cognitive decline, she has some acute metabolic encephalopathy on top of it due to missed HD treatment, anemia and being in unfamiliar setting.  DM type II.  Sliding scale.  Lab Results  Component Value Date   HGBA1C 4.9 04/29/2021    CBG (last 3)  Recent Labs    04/30/21 1738 04/30/21 2155 05/01/21 0637  GLUCAP 89 121* 112*          Condition - Extremely Guarded  Family Communication  : Husband Curtis over the phone on 04/30/2021  Code Status :  Full  Consults  :  IR, GI, Renal  PUD Prophylaxis : PPI   Procedures  :     New dialysis catheter to be placed 04/29/2021      Disposition Plan  :    Status is: Observation  DVT Prophylaxis  :    Place and maintain sequential compression device Start: 04/29/21  1224    Lab Results  Component Value Date   PLT 187 05/01/2021    Diet :  Diet Order             DIET SOFT Room service appropriate? Yes; Fluid consistency: Nectar Thick  Diet effective now                    Inpatient Medications  Scheduled Meds:  atorvastatin  40 mg Oral QPM   calcium acetate  1,334 mg Oral TID WC   Chlorhexidine Gluconate Cloth  6 each Topical Q0600   dextrose  50 mL Intravenous Once   insulin aspart  0-6 Units Subcutaneous TID WC   metoprolol succinate  100 mg Oral QPM   pantoprazole (PROTONIX) IV  40 mg Intravenous Q12H   venlafaxine XR  150 mg Oral Daily   Continuous Infusions:  sodium chloride     sodium chloride     sodium chloride     sodium chloride     PRN Meds:.sodium chloride, sodium chloride, sodium chloride, sodium chloride, acetaminophen **OR** acetaminophen, alteplase, alteplase, heparin, heparin, heparin, heparin, hydrALAZINE, lidocaine (PF), lidocaine (PF), lidocaine-prilocaine, lidocaine-prilocaine, ondansetron **OR** ondansetron (ZOFRAN) IV, pentafluoroprop-tetrafluoroeth, pentafluoroprop-tetrafluoroeth  Antibiotics  :    Anti-infectives (From admission, onward)    Start     Dose/Rate Route Frequency Ordered Stop   04/29/21 1430  ceFAZolin (ANCEF) IVPB 2g/100 mL premix        2 g 200 mL/hr over 30 Minutes Intravenous  Once 04/29/21 1332 04/29/21 1402        Time Spent in minutes  30   Lala Lund M.D on 05/01/2021 at 8:57 AM  To  page go to [email or web address]   [facility] -  Office  [phone]  See all Orders from today for further details    Objective:   Vitals:   04/30/21 2318 05/01/21 0320 05/01/21 0538 05/01/21 0735  BP: 107/62 123/88  126/70  Pulse: 98 96  99  Resp: 16 20  14   Temp: 98.3 F (36.8 C) 98.3 F (36.8 C)  98.4 F (36.9 C)  TempSrc: Oral Oral  Oral  SpO2: 94% 100%  97%  Weight:   71.9 kg   Height:        Wt Readings from Last 3 Encounters:  05/01/21 71.9 kg  01/17/21 71 kg   11/21/20 75.3 kg     Intake/Output Summary (Last 24 hours) at 05/01/2021 0857 Last data filed at 05/01/2021 0555 Gross per 24 hour  Intake 120 ml  Output 1030 ml  Net -910 ml     Physical Exam  Awake Alert, x 2 No new F.N deficits,  Right IJ hemodialysis catheter. [place].AT,PERRAL Supple Neck, No JVD,   Symmetrical Chest wall movement, Good air movement bilaterally, CTAB RRR,No Gallops, Rubs or new Murmurs,  +ve B.Sounds, Abd Soft, No tenderness,   No Cyanosis, Clubbing or edema     RN pressure injury documentation: Pressure Ulcer 04/26/14 Stage II -  Partial thickness loss of dermis presenting as a shallow open ulcer with a red, pink wound bed without slough. pt states this comes from wears a pad in her depends at home (Active)  04/26/14 1503  Location: Sacrum  Location Orientation: Mid  Staging: Stage II -  Partial thickness loss of dermis presenting as a shallow open ulcer with a red, pink wound bed without slough.  Wound Description (Comments): pt states this comes from wears a pad in her depends at home  Present on Admission: Yes     Pressure Injury 09/11/19 Buttocks Left Stage 2 -  Partial thickness loss of dermis presenting as a shallow open injury with a red, pink wound bed without slough. (Active)  09/11/19 2200  Location: Buttocks  Location Orientation: Left  Staging: Stage 2 -  Partial thickness loss of dermis presenting as a shallow open injury with a red, pink wound bed without slough.  Wound Description (Comments):   Present on Admission: Yes     Pressure Injury 01/15/21 Sacrum Lower;Medial Stage 1 -  Intact skin with non-blanchable redness of a localized area usually over a bony prominence. Stage 1, healed pressure injury to right sacrum (Active)  01/15/21 0631  Location: Sacrum  Location Orientation: Lower;Medial  Staging: Stage 1 -  Intact skin with non-blanchable redness of a localized area usually over a bony prominence.  Wound Description (Comments): Stage  1, healed pressure injury to right sacrum  Present on Admission: Yes     Data Review:    CBC Recent Labs  Lab 04/28/21 1303 04/29/21 0555 04/29/21 2000 04/29/21 2153 04/30/21 0146 04/30/21 1450 05/01/21 0138  WBC 15.2* 9.2  --  11.9* 10.7* 8.5 7.4  HGB 7.0* 5.9* 8.3* 7.3* 8.3* 7.7* 7.8*  HCT 23.7* 20.3* 26.7* 23.3* 26.9* 25.5* 25.5*  PLT 270 232  --  218 245 236 187  MCV 94.0 93.1  --  88.6 90.6 92.4 90.7  MCH 27.8 27.1  --  27.8 27.9 27.9 27.8  MCHC 29.5* 29.1*  --  31.3 30.9 30.2 30.6  RDW 18.7* 18.6*  --  17.4* 17.2* 17.4* 17.2*  LYMPHSABS 1.1  --   --   --  1.3  --  1.2  MONOABS 1.5*  --   --   --  1.1*  --  0.9  EOSABS 0.2  --   --   --  0.0  --  0.2  BASOSABS 0.0  --   --   --  0.0  --  0.0    Electrolytes Recent Labs  Lab 04/28/21 1303 04/29/21 0555 04/30/21 0146 05/01/21 0138  NA 139 137 138 132*  K 3.9 4.2 4.1 4.0  CL 103 99 98 97*  CO2 23 23 25 25   GLUCOSE 144* 72 109* 110*  BUN 38* 44* 19 12  CREATININE 5.80* 6.73* 3.71* 2.50*  CALCIUM 6.7* 6.7* 7.6* 7.8*  AST  --   --  11* 25  ALT  --   --  10 6  ALKPHOS  --   --  78 74  BILITOT  --   --  0.9 1.8*  ALBUMIN  --   --  2.4* 2.3*  MG  --  1.9 1.7 1.9  HGBA1C  --  4.9  --   --   BNP  --   --  1,895.2* 1,332.2*    ------------------------------------------------------------------------------------------------------------------ No results for input(s): CHOL, HDL, LDLCALC, TRIG, CHOLHDL, LDLDIRECT in the last 72 hours.  Lab Results  Component Value Date   HGBA1C 4.9 04/29/2021    No results for input(s): TSH, T4TOTAL, T3FREE, THYROIDAB in the last 72 hours.  Invalid input(s): FREET3 ------------------------------------------------------------------------------------------------------------------ ID Labs Recent Labs  Lab 04/28/21 1303 04/29/21 0555 04/29/21 2153 04/30/21 0146 04/30/21 1450 05/01/21 0138  WBC 15.2* 9.2 11.9* 10.7* 8.5 7.4  PLT 270 232 218 245 236 187  CREATININE 5.80*  6.73*  --  3.71*  --  2.50*   Cardiac Enzymes No results for input(s): CKMB, TROPONINI, MYOGLOBIN in the last 168 hours.  Invalid input(s): CK   Radiology Reports IR Venocavagram Svc  Result Date: 04/29/2021 INDICATION: 71 year old female with history of end-stage renal disease and malfunctioning indwelling right internal jugular tunneled hemodialysis catheter. The patient has previously presented for multiple tunneled line exchanges and central venoplasty. EXAM: 1. TUNNELED CENTRAL VENOUS HEMODIALYSIS CATHETER REPLACEMENT WITH FLUOROSCOPIC GUIDANCE 2. Central venogram 3. Intravascular ultrasound 4. Balloon venoplasty of the right innominate vein and superior vena cava. MEDICATIONS: 2 g Ancef, intravenous Moderate (conscious) sedation was employed during this procedure. A total of Versed 0.5 mg and Fentanyl 25 mcg was administered intravenously. Moderate Sedation Time: 42 minutes. The patient's level of consciousness and vital signs were monitored continuously by radiology nursing throughout the procedure under my direct supervision. FLUOROSCOPY TIME:  Seven minutes 42 seconds, 65 mGy CONTRAST:  20 mL Omnipaque 300, intravenous COMPLICATIONS: None immediate. PROCEDURE: Informed written consent was obtained from the patient after a discussion of the risks, benefits, and alternatives to treatment. Questions regarding the procedure were encouraged and answered. The skin and external portion of the existing hemodialysis catheter was prepped with chlorhexidine in a sterile fashion, and a sterile drape was applied covering the operative field. Maximum barrier sterile technique with sterile gowns and gloves were used for the procedure. A timeout was performed prior to the initiation of the procedure. Both lumens of the hemodialysis catheter were cannulated with a stiff Glidewire and advanced to the level of the right atrium. Under intermittent fluoroscopic guidance, the existing dialysis catheter was exchanged  for a 12 French, 45 cm vascular sheath with the tip positioned in the right innominate vein, which was inserted over 1 of the glide wires.  Central venogram was performed which demonstrated patency of the superior vena cava with brisk antegrade flow. There is mild tortuosity of the central superior vena cava near the cavoatrial junction. Intravascular ultrasound was then utilized to study the central veins. Within the innominate vein, extending into the superior vena cava is an echogenic fibrin sheath surrounding the indwelling catheter track. Balloon venoplasty was then performed of the right innominate vein and superior vena cava with a 10 mm x 8 cm Athletis balloon. Repeat intravascular ultrasound evaluation demonstrated no evidence of vessel rupture, however did demonstrate disruption of the fibrin sheath. Under intravascular ultrasound guidance, the safety glidewire was retracted to the peripheral aspect of the right innominate vein and redirected outside of the fibrin sheath to the normal lumen of the innominate vein and superior vena cava. The tip of the wire was then positioned in the inferior vena cava. The Rosen wire and sheath were removed. A new, 23 cm tunneled hemodialysis catheter was inserted over the indwelling Glidewire. Unfortunately there was the resistance met and the catheter was unable to be advanced beyond the level of the cavoatrial junction into the right atrium. Therefore, the catheter was removed and the sheath was reinserted. Additional balloon venoplasty was performed about the cavoatrial junction and central SVC with a 14 mm x 4 cm Atlas balloon. The sheath was then again removed and the 23 cm tunneled hemodialysis catheter was inserted without difficulty. The tip was positioned in the right atrium, at the most cavernous location correlated with intravascular ultrasound imaging. The catheter aspirates and flushes normally. The catheter was flushed with appropriate volume heparin dwells.  The catheter exit site was secured with a 0-Silk retention sutures. Both lumens were heparinized. A dressing was placed. The patient tolerated the procedure well without immediate post procedural complication. IMPRESSION: 1. 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 Tortuosity and mild stenosis of the cavoatrial junction. 2. Intravascular ultrasound guided fibrin sheath disruption and repositioning of hemodialysis access, external to the fibrin sheath. 3. Technically successful replacement of 23 cm tunneled hemodialysis catheter with the tip in the right atrium. PLAN: The catheter is ready for immediate use. Ruthann Cancer, MD [facility] Radiology Electronically Signed   By: Ruthann Cancer M.D.   On: 04/29/2021 16:05   IR Fluoro Guide CV Line Right  Result Date: 04/29/2021 INDICATION: 71 year old female with history of end-stage renal disease and malfunctioning indwelling right internal jugular tunneled hemodialysis catheter. The patient has previously presented for multiple tunneled line exchanges and central venoplasty. EXAM: 1. TUNNELED CENTRAL VENOUS HEMODIALYSIS CATHETER REPLACEMENT WITH FLUOROSCOPIC GUIDANCE 2. Central venogram 3. Intravascular ultrasound 4. Balloon venoplasty of the right innominate vein and superior vena cava. MEDICATIONS: 2 g Ancef, intravenous Moderate (conscious) sedation was employed during this procedure. A total of Versed 0.5 mg and Fentanyl 25 mcg was administered intravenously. Moderate Sedation Time: 42 minutes. The patient's level of consciousness and vital signs were monitored continuously by radiology nursing throughout the procedure under my direct supervision. FLUOROSCOPY TIME:  Seven minutes 42 seconds, 65 mGy CONTRAST:  20 mL Omnipaque 300, intravenous COMPLICATIONS: None immediate. PROCEDURE: Informed written consent was obtained from the patient after a discussion of the risks, benefits, and  alternatives to treatment. Questions regarding the procedure were encouraged and answered. The skin and external portion of the existing hemodialysis catheter was prepped with chlorhexidine in a sterile fashion, and a sterile drape was applied covering the operative field. Maximum barrier sterile technique with sterile gowns and  gloves were used for the procedure. A timeout was performed prior to the initiation of the procedure. Both lumens of the hemodialysis catheter were cannulated with a stiff Glidewire and advanced to the level of the right atrium. Under intermittent fluoroscopic guidance, the existing dialysis catheter was exchanged for a 12 French, 45 cm vascular sheath with the tip positioned in the right innominate vein, which was inserted over 1 of the glide wires. Central venogram was performed which demonstrated patency of the superior vena cava with brisk antegrade flow. There is mild tortuosity of the central superior vena cava near the cavoatrial junction. Intravascular ultrasound was then utilized to study the central veins. Within the innominate vein, extending into the superior vena cava is an echogenic fibrin sheath surrounding the indwelling catheter track. Balloon venoplasty was then performed of the right innominate vein and superior vena cava with a 10 mm x 8 cm Athletis balloon. Repeat intravascular ultrasound evaluation demonstrated no evidence of vessel rupture, however did demonstrate disruption of the fibrin sheath. Under intravascular ultrasound guidance, the safety glidewire was retracted to the peripheral aspect of the right innominate vein and redirected outside of the fibrin sheath to the normal lumen of the innominate vein and superior vena cava. The tip of the wire was then positioned in the inferior vena cava. The Rosen wire and sheath were removed. A new, 23 cm tunneled hemodialysis catheter was inserted over the indwelling Glidewire. Unfortunately there was the resistance met  and the catheter was unable to be advanced beyond the level of the cavoatrial junction into the right atrium. Therefore, the catheter was removed and the sheath was reinserted. Additional balloon venoplasty was performed about the cavoatrial junction and central SVC with a 14 mm x 4 cm Atlas balloon. The sheath was then again removed and the 23 cm tunneled hemodialysis catheter was inserted without difficulty. The tip was positioned in the right atrium, at the most cavernous location correlated with intravascular ultrasound imaging. The catheter aspirates and flushes normally. The catheter was flushed with appropriate volume heparin dwells. The catheter exit site was secured with a 0-Silk retention sutures. Both lumens were heparinized. A dressing was placed. The patient tolerated the procedure well without immediate post procedural complication. IMPRESSION: 1. 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 Tortuosity and mild stenosis of the cavoatrial junction. 2. Intravascular ultrasound guided fibrin sheath disruption and repositioning of hemodialysis access, external to the fibrin sheath. 3. Technically successful replacement of 23 cm tunneled hemodialysis catheter with the tip in the right atrium. PLAN: The catheter is ready for immediate use. Ruthann Cancer, MD [facility] Radiology Electronically Signed   By: Ruthann Cancer M.D.   On: 04/29/2021 16:05   DG Chest Port 1 View  Result Date: 05/01/2021 CLINICAL DATA:  Shortness of breath and cough. EXAM: PORTABLE CHEST 1 VIEW COMPARISON:  04/29/2021 FINDINGS: 0632 hours. The cardio pericardial silhouette is enlarged. Right IJ central line tip overlies the right atrium. There is persistent retrocardiac left base collapse/consolidative opacity with more prominent airspace disease in the left mid lung on today's study. Probable small left effusion. Right lung clear. Telemetry leads overlie  the chest. IMPRESSION: Interval development of airspace disease in the left mid lung. Persistent retrocardiac left base collapse/consolidative opacity with probable small left effusion. Electronically Signed   By: Misty Stanley M.D.   On: 05/01/2021 07:47   DG Chest Port 1 View  Result Date: 04/29/2021 CLINICAL DATA:  Shortness of breath, confusion, missed  dialysis for 1 week, history hypertension, diabetes mellitus, stroke, end-stage renal disease EXAM: PORTABLE CHEST 1 VIEW COMPARISON:  Portable exam 1537 hours compared to 04/28/2021 FINDINGS: RIGHT jugular line with tip projecting over RIGHT atrium. Enlargement of cardiac silhouette with pulmonary vascular congestion. Loop recorder versus leadless pacemaker projecting over LEFT heart. Atherosclerotic calcification aorta. RIGHT basilar atelectasis. Atelectasis versus consolidation LEFT lower lobe. No definite pleural effusion or pneumothorax. Vascular stents in the upper arms bilaterally. IMPRESSION: Atelectasis versus consolidation at LEFT lung base with mild RIGHT basilar atelectasis. Enlargement of cardiac silhouette with pulmonary vascular congestion. Aortic Atherosclerosis (ICD10-I70.0). Electronically Signed   By: Lavonia Dana M.D.   On: 04/29/2021 15:53   DG Chest Port 1 View  Result Date: 04/28/2021 CLINICAL DATA:  Shortness of breath EXAM: PORTABLE CHEST 1 VIEW COMPARISON:  Chest x-ray 01/14/2021 FINDINGS: Cardiomegaly and mediastinum appear unchanged. Calcified plaques in the thoracic aorta. Right-sided central line with the tip in the right atrium. Left-sided atrial loop recorder. Pulmonary vascular prominence. No focal consolidation identified. No definite pleural effusion visualized. No pneumothorax. IMPRESSION: Cardiomegaly with pulmonary vascular congestion. Electronically Signed   By: Ofilia Neas M.D.   On: 04/28/2021 12:58   IR PTA VENOUS EXCEPT DIALYSIS CIRCUIT  Result Date: 04/29/2021 INDICATION: 71 year old female with history of  end-stage renal disease and malfunctioning indwelling right internal jugular tunneled hemodialysis catheter. The patient has previously presented for multiple tunneled line exchanges and central venoplasty. EXAM: 1. TUNNELED CENTRAL VENOUS HEMODIALYSIS CATHETER REPLACEMENT WITH FLUOROSCOPIC GUIDANCE 2. Central venogram 3. Intravascular ultrasound 4. Balloon venoplasty of the right innominate vein and superior vena cava. MEDICATIONS: 2 g Ancef, intravenous Moderate (conscious) sedation was employed during this procedure. A total of Versed 0.5 mg and Fentanyl 25 mcg was administered intravenously. Moderate Sedation Time: 42 minutes. The patient's level of consciousness and vital signs were monitored continuously by radiology nursing throughout the procedure under my direct supervision. FLUOROSCOPY TIME:  Seven minutes 42 seconds, 65 mGy CONTRAST:  20 mL Omnipaque 300, intravenous COMPLICATIONS: None immediate. PROCEDURE: Informed written consent was obtained from the patient after a discussion of the risks, benefits, and alternatives to treatment. Questions regarding the procedure were encouraged and answered. The skin and external portion of the existing hemodialysis catheter was prepped with chlorhexidine in a sterile fashion, and a sterile drape was applied covering the operative field. Maximum barrier sterile technique with sterile gowns and gloves were used for the procedure. A timeout was performed prior to the initiation of the procedure. Both lumens of the hemodialysis catheter were cannulated with a stiff Glidewire and advanced to the level of the right atrium. Under intermittent fluoroscopic guidance, the existing dialysis catheter was exchanged for a 12 French, 45 cm vascular sheath with the tip positioned in the right innominate vein, which was inserted over 1 of the glide wires. Central venogram was performed which demonstrated patency of the superior vena cava with brisk antegrade flow. There is mild  tortuosity of the central superior vena cava near the cavoatrial junction. Intravascular ultrasound was then utilized to study the central veins. Within the innominate vein, extending into the superior vena cava is an echogenic fibrin sheath surrounding the indwelling catheter track. Balloon venoplasty was then performed of the right innominate vein and superior vena cava with a 10 mm x 8 cm Athletis balloon. Repeat intravascular ultrasound evaluation demonstrated no evidence of vessel rupture, however did demonstrate disruption of the fibrin sheath. Under intravascular ultrasound guidance, the safety glidewire was retracted to the peripheral aspect  of the right innominate vein and redirected outside of the fibrin sheath to the normal lumen of the innominate vein and superior vena cava. The tip of the wire was then positioned in the inferior vena cava. The Rosen wire and sheath were removed. A new, 23 cm tunneled hemodialysis catheter was inserted over the indwelling Glidewire. Unfortunately there was the resistance met and the catheter was unable to be advanced beyond the level of the cavoatrial junction into the right atrium. Therefore, the catheter was removed and the sheath was reinserted. Additional balloon venoplasty was performed about the cavoatrial junction and central SVC with a 14 mm x 4 cm Atlas balloon. The sheath was then again removed and the 23 cm tunneled hemodialysis catheter was inserted without difficulty. The tip was positioned in the right atrium, at the most cavernous location correlated with intravascular ultrasound imaging. The catheter aspirates and flushes normally. The catheter was flushed with appropriate volume heparin dwells. The catheter exit site was secured with a 0-Silk retention sutures. Both lumens were heparinized. A dressing was placed. The patient tolerated the procedure well without immediate post procedural complication. IMPRESSION: 1. 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 Tortuosity and mild stenosis of the cavoatrial junction. 2. Intravascular ultrasound guided fibrin sheath disruption and repositioning of hemodialysis access, external to the fibrin sheath. 3. Technically successful replacement of 23 cm tunneled hemodialysis catheter with the tip in the right atrium. PLAN: The catheter is ready for immediate use. Ruthann Cancer, MD [facility] Radiology Electronically Signed   By: Ruthann Cancer M.D.   On: 04/29/2021 16:05   IR INTRAVASCULAR ULTRASOUND NON CORONARY  Result Date: 04/29/2021 INDICATION: 71 year old female with history of end-stage renal disease and malfunctioning indwelling right internal jugular tunneled hemodialysis catheter. The patient has previously presented for multiple tunneled line exchanges and central venoplasty. EXAM: 1. TUNNELED CENTRAL VENOUS HEMODIALYSIS CATHETER REPLACEMENT WITH FLUOROSCOPIC GUIDANCE 2. Central venogram 3. Intravascular ultrasound 4. Balloon venoplasty of the right innominate vein and superior vena cava. MEDICATIONS: 2 g Ancef, intravenous Moderate (conscious) sedation was employed during this procedure. A total of Versed 0.5 mg and Fentanyl 25 mcg was administered intravenously. Moderate Sedation Time: 42 minutes. The patient's level of consciousness and vital signs were monitored continuously by radiology nursing throughout the procedure under my direct supervision. FLUOROSCOPY TIME:  Seven minutes 42 seconds, 65 mGy CONTRAST:  20 mL Omnipaque 300, intravenous COMPLICATIONS: None immediate. PROCEDURE: Informed written consent was obtained from the patient after a discussion of the risks, benefits, and alternatives to treatment. Questions regarding the procedure were encouraged and answered. The skin and external portion of the existing hemodialysis catheter was prepped with chlorhexidine in a sterile fashion, and a sterile drape was applied covering  the operative field. Maximum barrier sterile technique with sterile gowns and gloves were used for the procedure. A timeout was performed prior to the initiation of the procedure. Both lumens of the hemodialysis catheter were cannulated with a stiff Glidewire and advanced to the level of the right atrium. Under intermittent fluoroscopic guidance, the existing dialysis catheter was exchanged for a 12 French, 45 cm vascular sheath with the tip positioned in the right innominate vein, which was inserted over 1 of the glide wires. Central venogram was performed which demonstrated patency of the superior vena cava with brisk antegrade flow. There is mild tortuosity of the central superior vena cava near the cavoatrial junction. Intravascular ultrasound was then utilized to study the central veins. Within  the innominate vein, extending into the superior vena cava is an echogenic fibrin sheath surrounding the indwelling catheter track. Balloon venoplasty was then performed of the right innominate vein and superior vena cava with a 10 mm x 8 cm Athletis balloon. Repeat intravascular ultrasound evaluation demonstrated no evidence of vessel rupture, however did demonstrate disruption of the fibrin sheath. Under intravascular ultrasound guidance, the safety glidewire was retracted to the peripheral aspect of the right innominate vein and redirected outside of the fibrin sheath to the normal lumen of the innominate vein and superior vena cava. The tip of the wire was then positioned in the inferior vena cava. The Rosen wire and sheath were removed. A new, 23 cm tunneled hemodialysis catheter was inserted over the indwelling Glidewire. Unfortunately there was the resistance met and the catheter was unable to be advanced beyond the level of the cavoatrial junction into the right atrium. Therefore, the catheter was removed and the sheath was reinserted. Additional balloon venoplasty was performed about the cavoatrial junction and  central SVC with a 14 mm x 4 cm Atlas balloon. The sheath was then again removed and the 23 cm tunneled hemodialysis catheter was inserted without difficulty. The tip was positioned in the right atrium, at the most cavernous location correlated with intravascular ultrasound imaging. The catheter aspirates and flushes normally. The catheter was flushed with appropriate volume heparin dwells. The catheter exit site was secured with a 0-Silk retention sutures. Both lumens were heparinized. A dressing was placed. The patient tolerated the procedure well without immediate post procedural complication. IMPRESSION: 1. 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 Tortuosity and mild stenosis of the cavoatrial junction. 2. Intravascular ultrasound guided fibrin sheath disruption and repositioning of hemodialysis access, external to the fibrin sheath. 3. Technically successful replacement of 23 cm tunneled hemodialysis catheter with the tip in the right atrium. PLAN: The catheter is ready for immediate use. Ruthann Cancer, MD [facility] Radiology Electronically Signed   By: Ruthann Cancer M.D.   On: 04/29/2021 16:05

## 2021-05-01 NOTE — Progress Notes (Signed)
Subjective: No complaints.  Feeling better.  Objective: Vital signs in last 24 hours: Temp:  [97.8 F (36.6 C)-98.7 F (37.1 C)] 98.4 F (36.9 C) (03/03 1117) Pulse Rate:  [95-103] 95 (03/03 1117) Resp:  [13-20] 14 (03/03 1117) BP: (90-149)/(51-90) 134/84 (03/03 1117) SpO2:  [94 %-100 %] 94 % (03/03 1117) Weight:  [71.9 kg-85.7 kg] 71.9 kg (03/03 0538) Last BM Date : 04/30/21  Intake/Output from previous day: 03/02 0701 - 03/03 0700 In: 120 [P.O.:120] Out: 1030  Intake/Output this shift: No intake/output data recorded.  General appearance: alert and no distress GI: soft, non-tender; bowel sounds normal; no masses,  no organomegaly  Lab Results: Recent Labs    04/30/21 0146 04/30/21 1450 05/01/21 0138  WBC 10.7* 8.5 7.4  HGB 8.3* 7.7* 7.8*  HCT 26.9* 25.5* 25.5*  PLT 245 236 187   BMET Recent Labs    04/29/21 0555 04/30/21 0146 05/01/21 0138  NA 137 138 132*  K 4.2 4.1 4.0  CL 99 98 97*  CO2 23 25 25   GLUCOSE 72 109* 110*  BUN 44* 19 12  CREATININE 6.73* 3.71* 2.50*  CALCIUM 6.7* 7.6* 7.8*   LFT Recent Labs    05/01/21 0138  PROT 5.7*  ALBUMIN 2.3*  AST 25  ALT 6  ALKPHOS 74  BILITOT 1.8*   PT/INR No results for input(s): LABPROT, INR in the last 72 hours. Hepatitis Panel Recent Labs    04/29/21 0955  HEPBSAG NON REACTIVE   C-Diff No results for input(s): CDIFFTOX in the last 72 hours. Fecal Lactopherrin No results for input(s): FECLLACTOFRN in the last 72 hours.  Studies/Results: IR Venocavagram Svc  Result Date: 04/29/2021 INDICATION: 71 year old female with history of end-stage renal disease and malfunctioning indwelling right internal jugular tunneled hemodialysis catheter. The patient has previously presented for multiple tunneled line exchanges and central venoplasty. EXAM: 1. TUNNELED CENTRAL VENOUS HEMODIALYSIS CATHETER REPLACEMENT WITH FLUOROSCOPIC GUIDANCE 2. Central venogram 3. Intravascular ultrasound 4. Balloon venoplasty of  the right innominate vein and superior vena cava. MEDICATIONS: 2 g Ancef, intravenous Moderate (conscious) sedation was employed during this procedure. A total of Versed 0.5 mg and Fentanyl 25 mcg was administered intravenously. Moderate Sedation Time: 42 minutes. The patient's level of consciousness and vital signs were monitored continuously by radiology nursing throughout the procedure under my direct supervision. FLUOROSCOPY TIME:  Seven minutes 42 seconds, 65 mGy CONTRAST:  20 mL Omnipaque 300, intravenous COMPLICATIONS: None immediate. PROCEDURE: Informed written consent was obtained from the patient after a discussion of the risks, benefits, and alternatives to treatment. Questions regarding the procedure were encouraged and answered. The skin and external portion of the existing hemodialysis catheter was prepped with chlorhexidine in a sterile fashion, and a sterile drape was applied covering the operative field. Maximum barrier sterile technique with sterile gowns and gloves were used for the procedure. A timeout was performed prior to the initiation of the procedure. Both lumens of the hemodialysis catheter were cannulated with a stiff Glidewire and advanced to the level of the right atrium. Under intermittent fluoroscopic guidance, the existing dialysis catheter was exchanged for a 12 French, 45 cm vascular sheath with the tip positioned in the right innominate vein, which was inserted over 1 of the glide wires. Central venogram was performed which demonstrated patency of the superior vena cava with brisk antegrade flow. There is mild tortuosity of the central superior vena cava near the cavoatrial junction. Intravascular ultrasound was then utilized to study the central veins. Within the  innominate vein, extending into the superior vena cava is an echogenic fibrin sheath surrounding the indwelling catheter track. Balloon venoplasty was then performed of the right innominate vein and superior vena cava  with a 10 mm x 8 cm Athletis balloon. Repeat intravascular ultrasound evaluation demonstrated no evidence of vessel rupture, however did demonstrate disruption of the fibrin sheath. Under intravascular ultrasound guidance, the safety glidewire was retracted to the peripheral aspect of the right innominate vein and redirected outside of the fibrin sheath to the normal lumen of the innominate vein and superior vena cava. The tip of the wire was then positioned in the inferior vena cava. The Rosen wire and sheath were removed. A new, 23 cm tunneled hemodialysis catheter was inserted over the indwelling Glidewire. Unfortunately there was the resistance met and the catheter was unable to be advanced beyond the level of the cavoatrial junction into the right atrium. Therefore, the catheter was removed and the sheath was reinserted. Additional balloon venoplasty was performed about the cavoatrial junction and central SVC with a 14 mm x 4 cm Atlas balloon. The sheath was then again removed and the 23 cm tunneled hemodialysis catheter was inserted without difficulty. The tip was positioned in the right atrium, at the most cavernous location correlated with intravascular ultrasound imaging. The catheter aspirates and flushes normally. The catheter was flushed with appropriate volume heparin dwells. The catheter exit site was secured with a 0-Silk retention sutures. Both lumens were heparinized. A dressing was placed. The patient tolerated the procedure well without immediate post procedural complication. IMPRESSION: 1. 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 Tortuosity and mild stenosis of the cavoatrial junction. 2. Intravascular ultrasound guided fibrin sheath disruption and repositioning of hemodialysis access, external to the fibrin sheath. 3. Technically successful replacement of 23 cm tunneled hemodialysis catheter with the tip in the right atrium. PLAN: The catheter is ready for  immediate use. Ruthann Cancer, MD [facility] Radiology Electronically Signed   By: Ruthann Cancer M.D.   On: 04/29/2021 16:05   IR Fluoro Guide CV Line Right  Result Date: 04/29/2021 INDICATION: 72 year old female with history of end-stage renal disease and malfunctioning indwelling right internal jugular tunneled hemodialysis catheter. The patient has previously presented for multiple tunneled line exchanges and central venoplasty. EXAM: 1. TUNNELED CENTRAL VENOUS HEMODIALYSIS CATHETER REPLACEMENT WITH FLUOROSCOPIC GUIDANCE 2. Central venogram 3. Intravascular ultrasound 4. Balloon venoplasty of the right innominate vein and superior vena cava. MEDICATIONS: 2 g Ancef, intravenous Moderate (conscious) sedation was employed during this procedure. A total of Versed 0.5 mg and Fentanyl 25 mcg was administered intravenously. Moderate Sedation Time: 42 minutes. The patient's level of consciousness and vital signs were monitored continuously by radiology nursing throughout the procedure under my direct supervision. FLUOROSCOPY TIME:  Seven minutes 42 seconds, 65 mGy CONTRAST:  20 mL Omnipaque 300, intravenous COMPLICATIONS: None immediate. PROCEDURE: Informed written consent was obtained from the patient after a discussion of the risks, benefits, and alternatives to treatment. Questions regarding the procedure were encouraged and answered. The skin and external portion of the existing hemodialysis catheter was prepped with chlorhexidine in a sterile fashion, and a sterile drape was applied covering the operative field. Maximum barrier sterile technique with sterile gowns and gloves were used for the procedure. A timeout was performed prior to the initiation of the procedure. Both lumens of the hemodialysis catheter were cannulated with a stiff Glidewire and advanced to the level of the right atrium. Under intermittent fluoroscopic guidance,  the existing dialysis catheter  was exchanged for a 12 French, 45 cm vascular sheath with the tip positioned in the right innominate vein, which was inserted over 1 of the glide wires. Central venogram was performed which demonstrated patency of the superior vena cava with brisk antegrade flow. There is mild tortuosity of the central superior vena cava near the cavoatrial junction. Intravascular ultrasound was then utilized to study the central veins. Within the innominate vein, extending into the superior vena cava is an echogenic fibrin sheath surrounding the indwelling catheter track. Balloon venoplasty was then performed of the right innominate vein and superior vena cava with a 10 mm x 8 cm Athletis balloon. Repeat intravascular ultrasound evaluation demonstrated no evidence of vessel rupture, however did demonstrate disruption of the fibrin sheath. Under intravascular ultrasound guidance, the safety glidewire was retracted to the peripheral aspect of the right innominate vein and redirected outside of the fibrin sheath to the normal lumen of the innominate vein and superior vena cava. The tip of the wire was then positioned in the inferior vena cava. The Rosen wire and sheath were removed. A new, 23 cm tunneled hemodialysis catheter was inserted over the indwelling Glidewire. Unfortunately there was the resistance met and the catheter was unable to be advanced beyond the level of the cavoatrial junction into the right atrium. Therefore, the catheter was removed and the sheath was reinserted. Additional balloon venoplasty was performed about the cavoatrial junction and central SVC with a 14 mm x 4 cm Atlas balloon. The sheath was then again removed and the 23 cm tunneled hemodialysis catheter was inserted without difficulty. The tip was positioned in the right atrium, at the most cavernous location correlated with intravascular ultrasound imaging. The catheter aspirates and flushes normally. The catheter was flushed with appropriate volume  heparin dwells. The catheter exit site was secured with a 0-Silk retention sutures. Both lumens were heparinized. A dressing was placed. The patient tolerated the procedure well without immediate post procedural complication. IMPRESSION: 1. 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 Tortuosity and mild stenosis of the cavoatrial junction. 2. Intravascular ultrasound guided fibrin sheath disruption and repositioning of hemodialysis access, external to the fibrin sheath. 3. Technically successful replacement of 23 cm tunneled hemodialysis catheter with the tip in the right atrium. PLAN: The catheter is ready for immediate use. Ruthann Cancer, MD [facility] Radiology Electronically Signed   By: Ruthann Cancer M.D.   On: 04/29/2021 16:05   DG Chest Port 1 View  Result Date: 05/01/2021 CLINICAL DATA:  Shortness of breath and cough. EXAM: PORTABLE CHEST 1 VIEW COMPARISON:  04/29/2021 FINDINGS: 0632 hours. The cardio pericardial silhouette is enlarged. Right IJ central line tip overlies the right atrium. There is persistent retrocardiac left base collapse/consolidative opacity with more prominent airspace disease in the left mid lung on today's study. Probable small left effusion. Right lung clear. Telemetry leads overlie the chest. IMPRESSION: Interval development of airspace disease in the left mid lung. Persistent retrocardiac left base collapse/consolidative opacity with probable small left effusion. Electronically Signed   By: Misty Stanley M.D.   On: 05/01/2021 07:47   DG Chest Port 1 View  Result Date: 04/29/2021 CLINICAL DATA:  Shortness of breath, confusion, missed dialysis for 1 week, history hypertension, diabetes mellitus, stroke, end-stage renal disease EXAM: PORTABLE CHEST 1 VIEW COMPARISON:  Portable exam 1537 hours compared to 04/28/2021 FINDINGS: RIGHT jugular line with tip projecting over RIGHT atrium. Enlargement of cardiac  silhouette with  pulmonary vascular congestion. Loop recorder versus leadless pacemaker projecting over LEFT heart. Atherosclerotic calcification aorta. RIGHT basilar atelectasis. Atelectasis versus consolidation LEFT lower lobe. No definite pleural effusion or pneumothorax. Vascular stents in the upper arms bilaterally. IMPRESSION: Atelectasis versus consolidation at LEFT lung base with mild RIGHT basilar atelectasis. Enlargement of cardiac silhouette with pulmonary vascular congestion. Aortic Atherosclerosis (ICD10-I70.0). Electronically Signed   By: Lavonia Dana M.D.   On: 04/29/2021 15:53   IR PTA VENOUS EXCEPT DIALYSIS CIRCUIT  Result Date: 04/29/2021 INDICATION: 71 year old female with history of end-stage renal disease and malfunctioning indwelling right internal jugular tunneled hemodialysis catheter. The patient has previously presented for multiple tunneled line exchanges and central venoplasty. EXAM: 1. TUNNELED CENTRAL VENOUS HEMODIALYSIS CATHETER REPLACEMENT WITH FLUOROSCOPIC GUIDANCE 2. Central venogram 3. Intravascular ultrasound 4. Balloon venoplasty of the right innominate vein and superior vena cava. MEDICATIONS: 2 g Ancef, intravenous Moderate (conscious) sedation was employed during this procedure. A total of Versed 0.5 mg and Fentanyl 25 mcg was administered intravenously. Moderate Sedation Time: 42 minutes. The patient's level of consciousness and vital signs were monitored continuously by radiology nursing throughout the procedure under my direct supervision. FLUOROSCOPY TIME:  Seven minutes 42 seconds, 65 mGy CONTRAST:  20 mL Omnipaque 300, intravenous COMPLICATIONS: None immediate. PROCEDURE: Informed written consent was obtained from the patient after a discussion of the risks, benefits, and alternatives to treatment. Questions regarding the procedure were encouraged and answered. The skin and external portion of the existing hemodialysis catheter was prepped with chlorhexidine in a  sterile fashion, and a sterile drape was applied covering the operative field. Maximum barrier sterile technique with sterile gowns and gloves were used for the procedure. A timeout was performed prior to the initiation of the procedure. Both lumens of the hemodialysis catheter were cannulated with a stiff Glidewire and advanced to the level of the right atrium. Under intermittent fluoroscopic guidance, the existing dialysis catheter was exchanged for a 12 French, 45 cm vascular sheath with the tip positioned in the right innominate vein, which was inserted over 1 of the glide wires. Central venogram was performed which demonstrated patency of the superior vena cava with brisk antegrade flow. There is mild tortuosity of the central superior vena cava near the cavoatrial junction. Intravascular ultrasound was then utilized to study the central veins. Within the innominate vein, extending into the superior vena cava is an echogenic fibrin sheath surrounding the indwelling catheter track. Balloon venoplasty was then performed of the right innominate vein and superior vena cava with a 10 mm x 8 cm Athletis balloon. Repeat intravascular ultrasound evaluation demonstrated no evidence of vessel rupture, however did demonstrate disruption of the fibrin sheath. Under intravascular ultrasound guidance, the safety glidewire was retracted to the peripheral aspect of the right innominate vein and redirected outside of the fibrin sheath to the normal lumen of the innominate vein and superior vena cava. The tip of the wire was then positioned in the inferior vena cava. The Rosen wire and sheath were removed. A new, 23 cm tunneled hemodialysis catheter was inserted over the indwelling Glidewire. Unfortunately there was the resistance met and the catheter was unable to be advanced beyond the level of the cavoatrial junction into the right atrium. Therefore, the catheter was removed and the sheath was reinserted. Additional balloon  venoplasty was performed about the cavoatrial junction and central SVC with a 14 mm x 4 cm Atlas balloon. The sheath was then again removed and the 23 cm tunneled hemodialysis catheter was inserted  without difficulty. The tip was positioned in the right atrium, at the most cavernous location correlated with intravascular ultrasound imaging. The catheter aspirates and flushes normally. The catheter was flushed with appropriate volume heparin dwells. The catheter exit site was secured with a 0-Silk retention sutures. Both lumens were heparinized. A dressing was placed. The patient tolerated the procedure well without immediate post procedural complication. IMPRESSION: 1. 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 Tortuosity and mild stenosis of the cavoatrial junction. 2. Intravascular ultrasound guided fibrin sheath disruption and repositioning of hemodialysis access, external to the fibrin sheath. 3. Technically successful replacement of 23 cm tunneled hemodialysis catheter with the tip in the right atrium. PLAN: The catheter is ready for immediate use. Ruthann Cancer, MD [facility] [place] Radiology Electronically Signed   By: Ruthann Cancer M.D.   On: 04/29/2021 16:05   IR INTRAVASCULAR ULTRASOUND NON CORONARY  Result Date: 04/29/2021 INDICATION: 71 year old female with history of end-stage renal disease and malfunctioning indwelling right internal jugular tunneled hemodialysis catheter. The patient has previously presented for multiple tunneled line exchanges and central venoplasty. EXAM: 1. TUNNELED CENTRAL VENOUS HEMODIALYSIS CATHETER REPLACEMENT WITH FLUOROSCOPIC GUIDANCE 2. Central venogram 3. Intravascular ultrasound 4. Balloon venoplasty of the right innominate vein and superior vena cava. MEDICATIONS: 2 g Ancef, intravenous Moderate (conscious) sedation was employed during this procedure. A total of Versed 0.5 mg and Fentanyl 25 mcg was  administered intravenously. Moderate Sedation Time: 42 minutes. The patient's level of consciousness and vital signs were monitored continuously by radiology nursing throughout the procedure under my direct supervision. FLUOROSCOPY TIME:  Seven minutes 42 seconds, 65 mGy CONTRAST:  20 mL Omnipaque 300, intravenous COMPLICATIONS: None immediate. PROCEDURE: Informed written consent was obtained from the patient after a discussion of the risks, benefits, and alternatives to treatment. Questions regarding the procedure were encouraged and answered. The skin and external portion of the existing hemodialysis catheter was prepped with chlorhexidine in a sterile fashion, and a sterile drape was applied covering the operative field. Maximum barrier sterile technique with sterile gowns and gloves were used for the procedure. A timeout was performed prior to the initiation of the procedure. Both lumens of the hemodialysis catheter were cannulated with a stiff Glidewire and advanced to the level of the right atrium. Under intermittent fluoroscopic guidance, the existing dialysis catheter was exchanged for a 12 French, 45 cm vascular sheath with the tip positioned in the right innominate vein, which was inserted over 1 of the glide wires. Central venogram was performed which demonstrated patency of the superior vena cava with brisk antegrade flow. There is mild tortuosity of the central superior vena cava near the cavoatrial junction. Intravascular ultrasound was then utilized to study the central veins. Within the innominate vein, extending into the superior vena cava is an echogenic fibrin sheath surrounding the indwelling catheter track. Balloon venoplasty was then performed of the right innominate vein and superior vena cava with a 10 mm x 8 cm Athletis balloon. Repeat intravascular ultrasound evaluation demonstrated no evidence of vessel rupture, however did demonstrate disruption of the fibrin sheath. Under intravascular  ultrasound guidance, the safety glidewire was retracted to the peripheral aspect of the right innominate vein and redirected outside of the fibrin sheath to the normal lumen of the innominate vein and superior vena cava. The tip of the wire was then positioned in the inferior vena cava. The Rosen wire and sheath were removed. A new, 23 cm tunneled hemodialysis catheter was inserted over the  indwelling Glidewire. Unfortunately there was the resistance met and the catheter was unable to be advanced beyond the level of the cavoatrial junction into the right atrium. Therefore, the catheter was removed and the sheath was reinserted. Additional balloon venoplasty was performed about the cavoatrial junction and central SVC with a 14 mm x 4 cm Atlas balloon. The sheath was then again removed and the 23 cm tunneled hemodialysis catheter was inserted without difficulty. The tip was positioned in the right atrium, at the most cavernous location correlated with intravascular ultrasound imaging. The catheter aspirates and flushes normally. The catheter was flushed with appropriate volume heparin dwells. The catheter exit site was secured with a 0-Silk retention sutures. Both lumens were heparinized. A dressing was placed. The patient tolerated the procedure well without immediate post procedural complication. IMPRESSION: 1. 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 Tortuosity and mild stenosis of the cavoatrial junction. 2. Intravascular ultrasound guided fibrin sheath disruption and repositioning of hemodialysis access, external to the fibrin sheath. 3. Technically successful replacement of 23 cm tunneled hemodialysis catheter with the tip in the right atrium. PLAN: The catheter is ready for immediate use. Ruthann Cancer, MD [facility] Radiology Electronically Signed   By: Ruthann Cancer M.D.   On: 04/29/2021 16:05    Medications: Scheduled:   atorvastatin  40 mg Oral QPM   calcium acetate  1,334 mg Oral TID WC   Chlorhexidine Gluconate Cloth  6 each Topical Q0600   dextrose  50 mL Intravenous Once   insulin aspart  0-6 Units Subcutaneous TID WC   metoprolol succinate  100 mg Oral QPM   pantoprazole (PROTONIX) IV  40 mg Intravenous Q12H   polyethylene glycol-electrolytes  4,000 mL Oral Once   venlafaxine XR  150 mg Oral Daily   Continuous:  sodium chloride     sodium chloride      Assessment/Plan: 1) IDA. 2) ESRD. 3) Fluid overload - resolved.   The patient's pulmonary status improved.  She is not on any oxygen and she feels much better.  Further evaluation with an EGD/colonoscopy will be performed.  She is going to have her dialysis tomorrow.  Plan: 1) EGD/colonoscopy tomorrow.  LOS: 2 days   Alila Sotero D 05/01/2021, 12:28 PM

## 2021-05-02 ENCOUNTER — Inpatient Hospital Stay (HOSPITAL_COMMUNITY): Payer: Medicare Other | Admitting: Anesthesiology

## 2021-05-02 ENCOUNTER — Encounter (HOSPITAL_COMMUNITY)
Admission: EM | Disposition: A | Discharge status: Home / Self Care | Payer: Self-pay | Source: Home / Self Care | Attending: Internal Medicine

## 2021-05-02 DIAGNOSIS — K3189 Other diseases of stomach and duodenum: Secondary | ICD-10-CM

## 2021-05-02 DIAGNOSIS — I132 Hypertensive heart and chronic kidney disease with heart failure and with stage 5 chronic kidney disease, or end stage renal disease: Secondary | ICD-10-CM

## 2021-05-02 DIAGNOSIS — K2970 Gastritis, unspecified, without bleeding: Secondary | ICD-10-CM

## 2021-05-02 DIAGNOSIS — I5032 Chronic diastolic (congestive) heart failure: Secondary | ICD-10-CM

## 2021-05-02 DIAGNOSIS — K222 Esophageal obstruction: Secondary | ICD-10-CM

## 2021-05-02 DIAGNOSIS — N186 End stage renal disease: Secondary | ICD-10-CM

## 2021-05-02 DIAGNOSIS — D649 Anemia, unspecified: Secondary | ICD-10-CM | POA: Diagnosis not present

## 2021-05-02 HISTORY — PX: ESOPHAGOGASTRODUODENOSCOPY (EGD) WITH PROPOFOL: SHX5813

## 2021-05-02 HISTORY — PX: BIOPSY: SHX5522

## 2021-05-02 LAB — CBC WITH DIFFERENTIAL/PLATELET
Abs Immature Granulocytes: 0.03 10*3/uL (ref 0.00–0.07)
Basophils Absolute: 0 10*3/uL (ref 0.0–0.1)
Basophils Relative: 1 %
Eosinophils Absolute: 0.2 10*3/uL (ref 0.0–0.5)
Eosinophils Relative: 3 %
HCT: 29.3 % — ABNORMAL LOW (ref 36.0–46.0)
Hemoglobin: 8.9 g/dL — ABNORMAL LOW (ref 12.0–15.0)
Immature Granulocytes: 0 %
Lymphocytes Relative: 21 %
Lymphs Abs: 1.6 10*3/uL (ref 0.7–4.0)
MCH: 27.7 pg (ref 26.0–34.0)
MCHC: 30.4 g/dL (ref 30.0–36.0)
MCV: 91.3 fL (ref 80.0–100.0)
Monocytes Absolute: 0.9 10*3/uL (ref 0.1–1.0)
Monocytes Relative: 12 %
Neutro Abs: 4.6 10*3/uL (ref 1.7–7.7)
Neutrophils Relative %: 63 %
Platelets: 234 10*3/uL (ref 150–400)
RBC: 3.21 MIL/uL — ABNORMAL LOW (ref 3.87–5.11)
RDW: 16.9 % — ABNORMAL HIGH (ref 11.5–15.5)
WBC: 7.3 10*3/uL (ref 4.0–10.5)
nRBC: 0 % (ref 0.0–0.2)

## 2021-05-02 LAB — COMPREHENSIVE METABOLIC PANEL
ALT: 8 U/L (ref 0–44)
AST: 12 U/L — ABNORMAL LOW (ref 15–41)
Albumin: 2.6 g/dL — ABNORMAL LOW (ref 3.5–5.0)
Alkaline Phosphatase: 77 U/L (ref 38–126)
Anion gap: 14 (ref 5–15)
BUN: 20 mg/dL (ref 8–23)
CO2: 25 mmol/L (ref 22–32)
Calcium: 8.2 mg/dL — ABNORMAL LOW (ref 8.9–10.3)
Chloride: 97 mmol/L — ABNORMAL LOW (ref 98–111)
Creatinine, Ser: 4.06 mg/dL — ABNORMAL HIGH (ref 0.44–1.00)
GFR, Estimated: 11 mL/min — ABNORMAL LOW (ref >60)
Glucose, Bld: 121 mg/dL — ABNORMAL HIGH (ref 70–99)
Potassium: 3.6 mmol/L (ref 3.5–5.1)
Sodium: 136 mmol/L (ref 135–145)
Total Bilirubin: 0.3 mg/dL (ref 0.3–1.2)
Total Protein: 6.1 g/dL — ABNORMAL LOW (ref 6.5–8.1)

## 2021-05-02 LAB — GLUCOSE, CAPILLARY
Glucose-Capillary: 101 mg/dL — ABNORMAL HIGH (ref 70–99)
Glucose-Capillary: 115 mg/dL — ABNORMAL HIGH (ref 70–99)
Glucose-Capillary: 108 mg/dL — ABNORMAL HIGH (ref 70–99)
Glucose-Capillary: 179 mg/dL — ABNORMAL HIGH (ref 70–99)
Glucose-Capillary: 93 mg/dL (ref 70–99)

## 2021-05-02 LAB — BRAIN NATRIURETIC PEPTIDE: B Natriuretic Peptide: 1510.7 pg/mL — ABNORMAL HIGH (ref 0.0–100.0)

## 2021-05-02 LAB — MAGNESIUM: Magnesium: 1.8 mg/dL (ref 1.7–2.4)

## 2021-05-02 SURGERY — ESOPHAGOGASTRODUODENOSCOPY (EGD) WITH PROPOFOL
Anesthesia: Monitor Anesthesia Care

## 2021-05-02 MED ORDER — TRAMADOL HCL 50 MG PO TABS
50.0000 mg | ORAL_TABLET | Freq: Four times a day (QID) | ORAL | Status: DC | PRN
  Administered 2021-05-02 – 2021-05-03 (×3): 50 mg via ORAL
  Filled 2021-05-02 (×3): qty 1

## 2021-05-02 MED ORDER — DILTIAZEM HCL 25 MG/5ML IV SOLN
10.0000 mg | Freq: Four times a day (QID) | INTRAVENOUS | Status: DC | PRN
  Administered 2021-05-02: 10 mg via INTRAVENOUS
  Filled 2021-05-02 (×2): qty 5

## 2021-05-02 MED ORDER — HEPARIN SODIUM (PORCINE) 1000 UNIT/ML IJ SOLN
INTRAMUSCULAR | Status: AC
  Filled 2021-05-02: qty 10

## 2021-05-02 MED ORDER — APIXABAN 5 MG PO TABS
5.0000 mg | ORAL_TABLET | Freq: Two times a day (BID) | ORAL | Status: DC
  Administered 2021-05-02 – 2021-05-04 (×4): 5 mg via ORAL
  Filled 2021-05-02 (×5): qty 1

## 2021-05-02 MED ORDER — SODIUM CHLORIDE 0.9 % IV SOLN: INTRAVENOUS | Status: DC | PRN

## 2021-05-02 MED ORDER — PROPOFOL 10 MG/ML IV BOLUS
INTRAVENOUS | Status: DC | PRN
  Administered 2021-05-02: 20 mg via INTRAVENOUS

## 2021-05-02 MED ORDER — PROPOFOL 500 MG/50ML IV EMUL
INTRAVENOUS | Status: DC | PRN
Start: 2021-05-02 — End: 2021-05-02
  Administered 2021-05-02: 75 ug/kg/min via INTRAVENOUS

## 2021-05-02 SURGICAL SUPPLY — 15 items

## 2021-05-02 NOTE — Op Note (Addendum)
[facility] ?Patient Name: Kelli [name] ?Procedure Date : 05/02/2021 ?MRN: [id number] ?Attending MD: Carol Ada , MD ?Date of Birth: [birth year] ?CSN: [id number] ?Age: 71 ?Admit Type: Inpatient ?Procedure:                Upper GI endoscopy ?Indications:              Iron deficiency anemia ?Providers:                Carol Ada, MD, Grace Isaac, RN, Frazier Richards,  ?                          Technician ?Referring MD:              ?Medicines:                Propofol per Anesthesia ?Complications:            No immediate complications. ?Estimated Blood Loss:     Estimated blood loss was minimal. ?Procedure:                Pre-Anesthesia Assessment: ?                          - Prior to the procedure, a History and Physical  ?                          was performed, and patient medications and  ?                          allergies were reviewed. The patient's tolerance of  ?                          previous anesthesia was also reviewed. The risks  ?                          and benefits of the procedure and the sedation  ?                          options and risks were discussed with the patient.  ?                          All questions were answered, and informed consent  ?                          was obtained. Prior Anticoagulants: The patient has  ?                          taken Eliquis (apixaban), last dose was 4 days  ?                          prior to procedure. ASA Grade Assessment: III - A  ?                          patient with severe systemic disease. After  ?  reviewing the risks and benefits, the patient was  ?                          deemed in satisfactory condition to undergo the  ?                          procedure. ?                          - Sedation was administered by an anesthesia  ?                          professional. Deep sedation was attained. ?                          After obtaining informed consent, the endoscope was  ?                           passed under direct vision. Throughout the  ?                          procedure, the patient's blood pressure, pulse, and  ?                          oxygen saturations were monitored continuously. The  ?                          GIF-H190 ([id number]) Olympus endoscope was introduced  ?                          through the mouth, and advanced to the second part  ?                          of duodenum. The upper GI endoscopy was  ?                          accomplished without difficulty. The patient  ?                          tolerated the procedure well. ?Scope In: ?Scope Out: ?Findings: ?     One benign-appearing, intrinsic mild stenosis was found in the lower  ?     third of the esophagus. This stenosis measured 1.3 cm (inner diameter) x  ?     less than one cm (in length). The stenosis was traversed. ?     Patchy moderate inflammation characterized by erythema and granularity  ?     was found in the gastric body. Biopsies were taken with a cold forceps  ?     for Helicobacter pylori testing. ?     Patchy moderately erythematous mucosa without active bleeding and with  ?     no stigmata of bleeding was found in the duodenal bulb and in the second  ?     portion of the duodenum. Biopsies were taken with a cold forceps for  ?     histology. ?     The current findings may explain  the source of her anemia while on  ?     Eliquis. She was not able to prep for the colonoscopy as she could not  ?     drink the prep. It is not likely that she will be able to prep for a  ?     colonoscopy in the future. ?Impression:               - Benign-appearing esophageal stenosis. ?                          - Gastritis. Biopsied. ?                          - Erythematous duodenopathy. Biopsied. ?Recommendation:           - Return patient to hospital ward for ongoing care. ?                          - Resume regular diet. ?                          - Continue present medications. ?                          - Await pathology results. ?                           - Resume Eliquis. ?                          - Follow HGB and transfuse if necessary. ?                          - PPI QD. ?                          - If HGB stable she can be D/C'ed home and follow  ?                          up with PCP as an outpatient. ?Procedure Code(s):        --- Professional --- ?                          [phone], Esophagogastroduodenoscopy, flexible,  ?                          transoral; with biopsy, single or multiple ?Diagnosis Code(s):        --- Professional --- ?                          K22.2, Esophageal obstruction ?                          K29.70, Gastritis, unspecified, without bleeding ?                          K31.89, Other diseases of stomach and duodenum ?  D50.9, Iron deficiency anemia, unspecified ?CPT copyright 2019 [employer]. All rights reserved. ?The codes documented in this report are preliminary and upon coder review may  ?be revised to meet current compliance requirements. ?Carol Ada, MD ?Carol Ada, MD ?05/02/2021 1:31:44 PM ?This report has been signed electronically. ?Number of Addenda: 0 ?

## 2021-05-02 NOTE — Anesthesia Postprocedure Evaluation (Signed)
Anesthesia Post Note ? ?Patient: Kelli [name] ? ?Procedure(s) Performed: ESOPHAGOGASTRODUODENOSCOPY (EGD) WITH PROPOFOL ?BIOPSY ? ?  ? ?Patient location during evaluation: Endoscopy ?Anesthesia Type: MAC ?Level of consciousness: awake and alert ?Pain management: pain level controlled ?Vital Signs Assessment: post-procedure vital signs reviewed and stable ?Respiratory status: spontaneous breathing, nonlabored ventilation, respiratory function stable and patient connected to nasal cannula oxygen ?Cardiovascular status: stable and blood pressure returned to baseline ?Postop Assessment: no apparent nausea or vomiting ?Anesthetic complications: no ? ? ?No notable events documented. ? ?Last Vitals:  ?Vitals:  ? 05/02/21 1350 05/02/21 1400  ?BP: (!) 129/48 (!) 129/107  ?Pulse: (!) 106 (!) 102  ?Resp: (!) 25 13  ?Temp:  (!) 36.3 ?C  ?SpO2: 95% 94%  ?  ?Last Pain:  ?Vitals:  ? 05/02/21 1227  ?TempSrc: Temporal  ?PainSc:   ? ? ?  ?  ?  ?  ?  ?  ? ?Kelli [name] ? ? ? ? ?

## 2021-05-02 NOTE — Progress Notes (Signed)
removed 2813ms net fluid unable to remove more due to hypotension.  pre bp 184/89 post bp  177/74   pre weight 73.5kg post weight 70.5kg bed scales  cath ran well packed with heparin  clamped and capped. ?

## 2021-05-02 NOTE — Progress Notes (Signed)
PROGRESS NOTE                                                                                                                                                                                                             Patient Demographics:    Kelli [name], is a 71 y.o. female, DOB - 09-Mar-1950, XBM:[id number]  Outpatient Primary MD for the patient is [name], [name], MD    LOS - 3  Admit date - 04/28/2021    Chief Complaint  Patient presents with   Shortness of Breath       Brief Narrative (HPI from H&P)   71 year old female with history of ESRD on TTS schedule who lives in [place], [place] type II, chronic diastolic CHF, paroxysmal atrial fibrillation [place] vas 2 score of greater than 3 on Eliquis, essential hypertension, dyslipidemia and anxiety.  Patient presented to [facility], ER yesterday after she had issues with her HD dialysis catheter and she could only have 500 cc of fluid removed on 04/28/2021.  At the time of presentation to [facility], ER she was noted to have a hemoglobin of 7, mild leukocytosis, she was admitted for HD catheter exchange.  From [facility] she was transferred to [facility] for HD catheter exchange on 04/29/2021 she was noted to have a hemoglobin of 5.9 this morning at [facility] and a unit of blood transfusion was started, upon arrival to [facility], IR department she was noted to have 1 unit of packed RBC transfusion running, she also had bilateral crackles and had difficulty laying flat.  I was called to evaluate the patient in the IR department for further   Subjective:   Patient in bed, appears comfortable, denies any headache, no fever, no chest pain or pressure, no shortness of breath , no abdominal pain. No new focal weakness.   Assessment  & Plan :    Acute hypoxic respiratory failure due to acute on chronic diastolic CHF from missed dialysis treatment, HD catheter malfunction at [facility], worsened by acute on chronic anemia.  She had her HD catheter is exchanged 04/29/21.  Underwent under urgent HD  on 04/29/2021, shortness of breath much improved, renal following, TTS schedule.   ESRD.  On TTS schedule.   TTS schedule renal following  Acute anemia in the presence of anemia of chronic disease in a patient who is on Eliquis.  No history of melena or blood in stool, she was on Eliquis, no signs of brisk ongoing GI bleed, s/p 1 unit of packed RBC transfusion on 04/29/2021 with stable posttransfusion H&H, on IV PPI, Eliquis on hold GI on board, case discussed with GI physician Dr. Benson Norway on 04/30/2021.  Continue to monitor closely.  Will defer to GI on any further GI work-up including resumption of Eliquis.  She is due for EGD on 05/02/2021, she could not finish her bowel prep hence colonoscopy currently withheld.  History of stroke with dense left upper extremity hemiparesis and mild lower extremity weakness.  Continue supportive care.  Eliquis as above, continue statin for secondary prevention.  Essential hypertension.  On beta-blocker continue and monitor.  Paroxysmal atrial fibrillation [place] vas 2 score of greater than 5.  On beta-blocker which will be continued Eliquis as above.  Acute on chronic diastolic CHF EF 24% on echocardiogram done in November 2022.  This is due to missed dialysis treatment kindly see plan in #1 above continue beta-blocker for proper diastolic refilling.  Obesity.  BMI 31.  Follow with PCP for weight loss.  Mild metabolic encephalopathy.  Upon talking to the husband it seems like patient is developing some early dementia or age-related cognitive decline, she has some acute metabolic encephalopathy on top of it due to missed HD treatment, anemia and being in unfamiliar setting.  Dyslipidemia.  On statin  DM type II.  Sliding scale.  Lab Results  Component Value Date   HGBA1C 4.9 04/29/2021    CBG (last 3)  Recent Labs    05/01/21 1056  05/01/21 1613 05/02/21 0617  GLUCAP 117* 112* 101*          Condition - Extremely Guarded  Family Communication  : Husband Curtis over the phone on 04/30/2021  Code Status :  Full  Consults  :  IR, GI, Renal  PUD Prophylaxis : PPI   Procedures  :     New dialysis catheter to be placed 04/29/2021      Disposition Plan  :    Status is: Observation  DVT Prophylaxis  :    Place and maintain sequential compression device Start: 04/29/21 1224    Lab Results  Component Value Date   PLT 234 05/02/2021    Diet :  Diet Order             Diet NPO time specified  Diet effective now                    Inpatient Medications  Scheduled Meds:  atorvastatin  40 mg Oral QPM   calcium acetate  1,334 mg Oral TID WC   Chlorhexidine Gluconate Cloth  6 each Topical Q0600   dextrose  50 mL Intravenous Once   insulin aspart  0-6 Units Subcutaneous TID WC   metoprolol succinate  100 mg Oral QPM   pantoprazole (PROTONIX) IV  40 mg Intravenous Q12H   venlafaxine XR  150 mg Oral Daily   Continuous Infusions:  sodium chloride     sodium chloride     PRN Meds:.sodium chloride, sodium chloride, acetaminophen **OR** acetaminophen, alteplase, heparin, heparin, hydrALAZINE, lidocaine (PF), lidocaine-prilocaine, ondansetron **OR** ondansetron (ZOFRAN) IV, pentafluoroprop-tetrafluoroeth  Antibiotics  :  Anti-infectives (From admission, onward)    Start     Dose/Rate Route Frequency Ordered Stop   04/29/21 1430  ceFAZolin (ANCEF) IVPB 2g/100 mL premix        2 g 200 mL/hr over 30 Minutes Intravenous  Once 04/29/21 1332 04/29/21 1402        Time Spent in minutes  30   Lala Lund M.D on 05/02/2021 at 9:36 AM  To page go to [email or web address]   [facility] -  Office  [phone]  See all Orders from today for further details    Objective:   Vitals:   05/02/21 0800 05/02/21 0830 05/02/21 0900 05/02/21 0930  BP: (!) 153/109 (!) 143/93 109/68 116/74  Pulse:  (!) 108 (!) 107 89   Resp: _0 Temp:      TempSrc:      SpO2: 100% 97%    Weight:      Height:        Wt Readings from Last 3 Encounters:  05/02/21 73.5 kg  01/17/21 71 kg  11/21/20 75.3 kg     Intake/Output Summary (Last 24 hours) at 05/02/2021 0936 Last data filed at 05/02/2021 0001 Gross per 24 hour  Intake 120 ml  Output 0 ml  Net 120 ml     Physical Exam  Awake Alert x1 , No new F.N deficits, Right IJ hemodialysis catheter. Chronic left-sided hemiparesis arm more than leg [place].AT,PERRAL Supple Neck, No JVD,   Symmetrical Chest wall movement, Good air movement bilaterally, CTAB RRR,No Gallops, Rubs or new Murmurs,  +ve B.Sounds, Abd Soft, No tenderness,   No Cyanosis, Clubbing or edema      RN pressure injury documentation: Pressure Ulcer 04/26/14 Stage II -  Partial thickness loss of dermis presenting as a shallow open ulcer with a red, pink wound bed without slough. pt states this comes from wears a pad in her depends at home (Active)  04/26/14 1503  Location: Sacrum  Location Orientation: Mid  Staging: Stage II -  Partial thickness loss of dermis presenting as a shallow open ulcer with a red, pink wound bed without slough.  Wound Description (Comments): pt states this comes from wears a pad in her depends at home  Present on Admission: Yes     Pressure Injury 09/11/19 Buttocks Left Stage 2 -  Partial thickness loss of dermis presenting as a shallow open injury with a red, pink wound bed without slough. (Active)  09/11/19 2200  Location: Buttocks  Location Orientation: Left  Staging: Stage 2 -  Partial thickness loss of dermis presenting as a shallow open injury with a red, pink wound bed without slough.  Wound Description (Comments):   Present on Admission: Yes     Pressure Injury 01/15/21 Sacrum Lower;Medial Stage 1 -  Intact skin with non-blanchable redness of a localized area usually over a bony prominence. Stage 1, healed pressure injury to right  sacrum (Active)  01/15/21 0631  Location: Sacrum  Location Orientation: Lower;Medial  Staging: Stage 1 -  Intact skin with non-blanchable redness of a localized area usually over a bony prominence.  Wound Description (Comments): Stage 1, healed pressure injury to right sacrum  Present on Admission: Yes     Data Review:    CBC Recent Labs  Lab 04/28/21 1303 04/29/21 0555 04/29/21 2153 04/30/21 0146 04/30/21 1450 05/01/21 0138 05/02/21 0146  WBC 15.2*   < > 11.9* 10.7* 8.5 7.4 7.3  HGB 7.0*   < > 7.3* 8.3*  7.7* 7.8* 8.9*  HCT 23.7*   < > 23.3* 26.9* 25.5* 25.5* 29.3*  PLT 270   < > 218 245 236 187 234  MCV 94.0   < > 88.6 90.6 92.4 90.7 91.3  MCH 27.8   < > 27.8 27.9 27.9 27.8 27.7  MCHC 29.5*   < > 31.3 30.9 30.2 30.6 30.4  RDW 18.7*   < > 17.4* 17.2* 17.4* 17.2* 16.9*  LYMPHSABS 1.1  --   --  1.3  --  1.2 1.6  MONOABS 1.5*  --   --  1.1*  --  0.9 0.9  EOSABS 0.2  --   --  0.0  --  0.2 0.2  BASOSABS 0.0  --   --  0.0  --  0.0 0.0   < > = values in this interval not displayed.    Electrolytes Recent Labs  Lab 04/28/21 1303 04/29/21 0555 04/30/21 0146 05/01/21 0138 05/02/21 0146 05/02/21 0229  NA 139 137 138 132* 136  --   K 3.9 4.2 4.1 4.0 3.6  --   CL 103 99 98 97* 97*  --   CO2 _0 --   GLUCOSE 144* 72 109* 110* 121*  --   BUN 38* 44* _1 --   CREATININE 5.80* 6.73* 3.71* 2.50* 4.06*  --   CALCIUM 6.7* 6.7* 7.6* 7.8* 8.2*  --   AST  --   --  11* 25 12*  --   ALT  --   --  _2 --   ALKPHOS  --   --  78 74 77  --   BILITOT  --   --  0.9 1.8* 0.3  --   ALBUMIN  --   --  2.4* 2.3* 2.6*  --   MG  --  1.9 1.7 1.9 1.8  --   HGBA1C  --  4.9  --   --   --   --   BNP  --   --  1,895.2* 1,332.2*  --  1,510.7*    ------------------------------------------------------------------------------------------------------------------ No results for input(s): CHOL, HDL, LDLCALC, TRIG, CHOLHDL, LDLDIRECT in the last 72 hours.  Lab Results  Component  Value Date   HGBA1C 4.9 04/29/2021    No results for input(s): TSH, T4TOTAL, T3FREE, THYROIDAB in the last 72 hours.  Invalid input(s): FREET3 ------------------------------------------------------------------------------------------------------------------ ID Labs Recent Labs  Lab 04/28/21 1303 04/29/21 0555 04/29/21 2153 04/30/21 0146 04/30/21 1450 05/01/21 0138 05/02/21 0146  WBC 15.2* 9.2 11.9* 10.7* 8.5 7.4 7.3  PLT 270 232 218 245 236 187 234  CREATININE 5.80* 6.73*  --  3.71*  --  2.50* 4.06*   Cardiac Enzymes No results for input(s): CKMB, TROPONINI, MYOGLOBIN in the last 168 hours.  Invalid input(s): CK   Radiology Reports IR Venocavagram Svc  Result Date: 04/29/2021 INDICATION: 71 year old female with history of end-stage renal disease and malfunctioning indwelling right internal jugular tunneled hemodialysis catheter. The patient has previously presented for multiple tunneled line exchanges and central venoplasty. EXAM: 1. TUNNELED CENTRAL VENOUS HEMODIALYSIS CATHETER REPLACEMENT WITH FLUOROSCOPIC GUIDANCE 2. Central venogram 3. Intravascular ultrasound 4. Balloon venoplasty of the right innominate vein and superior vena cava. MEDICATIONS: 2 g Ancef, intravenous Moderate (conscious) sedation was employed during this procedure. A total of Versed 0.5 mg and Fentanyl 25 mcg was administered intravenously. Moderate Sedation Time: 42 minutes. The patient's level of consciousness and vital signs were monitored continuously by radiology nursing  throughout the procedure under my direct supervision. FLUOROSCOPY TIME:  Seven minutes 42 seconds, 65 mGy CONTRAST:  20 mL Omnipaque 300, intravenous COMPLICATIONS: None immediate. PROCEDURE: Informed written consent was obtained from the patient after a discussion of the risks, benefits, and alternatives to treatment. Questions regarding the procedure were encouraged and answered. The skin and external portion of the existing hemodialysis  catheter was prepped with chlorhexidine in a sterile fashion, and a sterile drape was applied covering the operative field. Maximum barrier sterile technique with sterile gowns and gloves were used for the procedure. A timeout was performed prior to the initiation of the procedure. Both lumens of the hemodialysis catheter were cannulated with a stiff Glidewire and advanced to the level of the right atrium. Under intermittent fluoroscopic guidance, the existing dialysis catheter was exchanged for a 12 French, 45 cm vascular sheath with the tip positioned in the right innominate vein, which was inserted over 1 of the glide wires. Central venogram was performed which demonstrated patency of the superior vena cava with brisk antegrade flow. There is mild tortuosity of the central superior vena cava near the cavoatrial junction. Intravascular ultrasound was then utilized to study the central veins. Within the innominate vein, extending into the superior vena cava is an echogenic fibrin sheath surrounding the indwelling catheter track. Balloon venoplasty was then performed of the right innominate vein and superior vena cava with a 10 mm x 8 cm Athletis balloon. Repeat intravascular ultrasound evaluation demonstrated no evidence of vessel rupture, however did demonstrate disruption of the fibrin sheath. Under intravascular ultrasound guidance, the safety glidewire was retracted to the peripheral aspect of the right innominate vein and redirected outside of the fibrin sheath to the normal lumen of the innominate vein and superior vena cava. The tip of the wire was then positioned in the inferior vena cava. The Rosen wire and sheath were removed. A new, 23 cm tunneled hemodialysis catheter was inserted over the indwelling Glidewire. Unfortunately there was the resistance met and the catheter was unable to be advanced beyond the level of the cavoatrial junction into the right atrium. Therefore, the catheter was removed and  the sheath was reinserted. Additional balloon venoplasty was performed about the cavoatrial junction and central SVC with a 14 mm x 4 cm Atlas balloon. The sheath was then again removed and the 23 cm tunneled hemodialysis catheter was inserted without difficulty. The tip was positioned in the right atrium, at the most cavernous location correlated with intravascular ultrasound imaging. The catheter aspirates and flushes normally. The catheter was flushed with appropriate volume heparin dwells. The catheter exit site was secured with a 0-Silk retention sutures. Both lumens were heparinized. A dressing was placed. The patient tolerated the procedure well without immediate post procedural complication. IMPRESSION: 1. 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 Tortuosity and mild stenosis of the cavoatrial junction. 2. Intravascular ultrasound guided fibrin sheath disruption and repositioning of hemodialysis access, external to the fibrin sheath. 3. Technically successful replacement of 23 cm tunneled hemodialysis catheter with the tip in the right atrium. PLAN: The catheter is ready for immediate use. Ruthann Cancer, MD [facility] Radiology Electronically Signed   By: Ruthann Cancer M.D.   On: 04/29/2021 16:05   IR Fluoro Guide CV Line Right  Result Date: 04/29/2021 INDICATION: 71 year old female with history of end-stage renal disease and malfunctioning indwelling right internal jugular tunneled hemodialysis catheter. The patient has previously presented for multiple tunneled line exchanges and central venoplasty.  EXAM: 1. TUNNELED CENTRAL VENOUS HEMODIALYSIS CATHETER REPLACEMENT WITH FLUOROSCOPIC GUIDANCE 2. Central venogram 3. Intravascular ultrasound 4. Balloon venoplasty of the right innominate vein and superior vena cava. MEDICATIONS: 2 g Ancef, intravenous Moderate (conscious) sedation was employed during this procedure. A total of  Versed 0.5 mg and Fentanyl 25 mcg was administered intravenously. Moderate Sedation Time: 42 minutes. The patient's level of consciousness and vital signs were monitored continuously by radiology nursing throughout the procedure under my direct supervision. FLUOROSCOPY TIME:  Seven minutes 42 seconds, 65 mGy CONTRAST:  20 mL Omnipaque 300, intravenous COMPLICATIONS: None immediate. PROCEDURE: Informed written consent was obtained from the patient after a discussion of the risks, benefits, and alternatives to treatment. Questions regarding the procedure were encouraged and answered. The skin and external portion of the existing hemodialysis catheter was prepped with chlorhexidine in a sterile fashion, and a sterile drape was applied covering the operative field. Maximum barrier sterile technique with sterile gowns and gloves were used for the procedure. A timeout was performed prior to the initiation of the procedure. Both lumens of the hemodialysis catheter were cannulated with a stiff Glidewire and advanced to the level of the right atrium. Under intermittent fluoroscopic guidance, the existing dialysis catheter was exchanged for a 12 French, 45 cm vascular sheath with the tip positioned in the right innominate vein, which was inserted over 1 of the glide wires. Central venogram was performed which demonstrated patency of the superior vena cava with brisk antegrade flow. There is mild tortuosity of the central superior vena cava near the cavoatrial junction. Intravascular ultrasound was then utilized to study the central veins. Within the innominate vein, extending into the superior vena cava is an echogenic fibrin sheath surrounding the indwelling catheter track. Balloon venoplasty was then performed of the right innominate vein and superior vena cava with a 10 mm x 8 cm Athletis balloon. Repeat intravascular ultrasound evaluation demonstrated no evidence of vessel rupture, however did demonstrate disruption of  the fibrin sheath. Under intravascular ultrasound guidance, the safety glidewire was retracted to the peripheral aspect of the right innominate vein and redirected outside of the fibrin sheath to the normal lumen of the innominate vein and superior vena cava. The tip of the wire was then positioned in the inferior vena cava. The Rosen wire and sheath were removed. A new, 23 cm tunneled hemodialysis catheter was inserted over the indwelling Glidewire. Unfortunately there was the resistance met and the catheter was unable to be advanced beyond the level of the cavoatrial junction into the right atrium. Therefore, the catheter was removed and the sheath was reinserted. Additional balloon venoplasty was performed about the cavoatrial junction and central SVC with a 14 mm x 4 cm Atlas balloon. The sheath was then again removed and the 23 cm tunneled hemodialysis catheter was inserted without difficulty. The tip was positioned in the right atrium, at the most cavernous location correlated with intravascular ultrasound imaging. The catheter aspirates and flushes normally. The catheter was flushed with appropriate volume heparin dwells. The catheter exit site was secured with a 0-Silk retention sutures. Both lumens were heparinized. A dressing was placed. The patient tolerated the procedure well without immediate post procedural complication. IMPRESSION: 1. 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 Tortuosity and mild stenosis of the cavoatrial junction. 2. Intravascular ultrasound guided fibrin sheath disruption and repositioning of hemodialysis access, external to the fibrin sheath. 3. Technically successful replacement of 23 cm tunneled hemodialysis catheter with the tip in the right atrium. PLAN:  The catheter is ready for immediate use. Ruthann Cancer, MD [facility] Radiology Electronically Signed   By: Ruthann Cancer M.D.   On: 04/29/2021  16:05   DG Chest Port 1 View  Result Date: 05/01/2021 CLINICAL DATA:  Shortness of breath and cough. EXAM: PORTABLE CHEST 1 VIEW COMPARISON:  04/29/2021 FINDINGS: 0632 hours. The cardio pericardial silhouette is enlarged. Right IJ central line tip overlies the right atrium. There is persistent retrocardiac left base collapse/consolidative opacity with more prominent airspace disease in the left mid lung on today's study. Probable small left effusion. Right lung clear. Telemetry leads overlie the chest. IMPRESSION: Interval development of airspace disease in the left mid lung. Persistent retrocardiac left base collapse/consolidative opacity with probable small left effusion. Electronically Signed   By: Misty Stanley M.D.   On: 05/01/2021 07:47   DG Chest Port 1 View  Result Date: 04/29/2021 CLINICAL DATA:  Shortness of breath, confusion, missed dialysis for 1 week, history hypertension, diabetes mellitus, stroke, end-stage renal disease EXAM: PORTABLE CHEST 1 VIEW COMPARISON:  Portable exam 1537 hours compared to 04/28/2021 FINDINGS: RIGHT jugular line with tip projecting over RIGHT atrium. Enlargement of cardiac silhouette with pulmonary vascular congestion. Loop recorder versus leadless pacemaker projecting over LEFT heart. Atherosclerotic calcification aorta. RIGHT basilar atelectasis. Atelectasis versus consolidation LEFT lower lobe. No definite pleural effusion or pneumothorax. Vascular stents in the upper arms bilaterally. IMPRESSION: Atelectasis versus consolidation at LEFT lung base with mild RIGHT basilar atelectasis. Enlargement of cardiac silhouette with pulmonary vascular congestion. Aortic Atherosclerosis (ICD10-I70.0). Electronically Signed   By: Lavonia Dana M.D.   On: 04/29/2021 15:53   DG Chest Port 1 View  Result Date: 04/28/2021 CLINICAL DATA:  Shortness of breath EXAM: PORTABLE CHEST 1 VIEW COMPARISON:  Chest x-ray 01/14/2021 FINDINGS: Cardiomegaly and mediastinum appear unchanged.  Calcified plaques in the thoracic aorta. Right-sided central line with the tip in the right atrium. Left-sided atrial loop recorder. Pulmonary vascular prominence. No focal consolidation identified. No definite pleural effusion visualized. No pneumothorax. IMPRESSION: Cardiomegaly with pulmonary vascular congestion. Electronically Signed   By: Ofilia Neas M.D.   On: 04/28/2021 12:58   IR PTA VENOUS EXCEPT DIALYSIS CIRCUIT  Result Date: 04/29/2021 INDICATION: 71 year old female with history of end-stage renal disease and malfunctioning indwelling right internal jugular tunneled hemodialysis catheter. The patient has previously presented for multiple tunneled line exchanges and central venoplasty. EXAM: 1. TUNNELED CENTRAL VENOUS HEMODIALYSIS CATHETER REPLACEMENT WITH FLUOROSCOPIC GUIDANCE 2. Central venogram 3. Intravascular ultrasound 4. Balloon venoplasty of the right innominate vein and superior vena cava. MEDICATIONS: 2 g Ancef, intravenous Moderate (conscious) sedation was employed during this procedure. A total of Versed 0.5 mg and Fentanyl 25 mcg was administered intravenously. Moderate Sedation Time: 42 minutes. The patient's level of consciousness and vital signs were monitored continuously by radiology nursing throughout the procedure under my direct supervision. FLUOROSCOPY TIME:  Seven minutes 42 seconds, 65 mGy CONTRAST:  20 mL Omnipaque 300, intravenous COMPLICATIONS: None immediate. PROCEDURE: Informed written consent was obtained from the patient after a discussion of the risks, benefits, and alternatives to treatment. Questions regarding the procedure were encouraged and answered. The skin and external portion of the existing hemodialysis catheter was prepped with chlorhexidine in a sterile fashion, and a sterile drape was applied covering the operative field. Maximum barrier sterile technique with sterile gowns and gloves were used for the procedure. A timeout was performed prior to the  initiation of the procedure. Both lumens of the hemodialysis catheter  were cannulated with a stiff Glidewire and advanced to the level of the right atrium. Under intermittent fluoroscopic guidance, the existing dialysis catheter was exchanged for a 12 French, 45 cm vascular sheath with the tip positioned in the right innominate vein, which was inserted over 1 of the glide wires. Central venogram was performed which demonstrated patency of the superior vena cava with brisk antegrade flow. There is mild tortuosity of the central superior vena cava near the cavoatrial junction. Intravascular ultrasound was then utilized to study the central veins. Within the innominate vein, extending into the superior vena cava is an echogenic fibrin sheath surrounding the indwelling catheter track. Balloon venoplasty was then performed of the right innominate vein and superior vena cava with a 10 mm x 8 cm Athletis balloon. Repeat intravascular ultrasound evaluation demonstrated no evidence of vessel rupture, however did demonstrate disruption of the fibrin sheath. Under intravascular ultrasound guidance, the safety glidewire was retracted to the peripheral aspect of the right innominate vein and redirected outside of the fibrin sheath to the normal lumen of the innominate vein and superior vena cava. The tip of the wire was then positioned in the inferior vena cava. The Rosen wire and sheath were removed. A new, 23 cm tunneled hemodialysis catheter was inserted over the indwelling Glidewire. Unfortunately there was the resistance met and the catheter was unable to be advanced beyond the level of the cavoatrial junction into the right atrium. Therefore, the catheter was removed and the sheath was reinserted. Additional balloon venoplasty was performed about the cavoatrial junction and central SVC with a 14 mm x 4 cm Atlas balloon. The sheath was then again removed and the 23 cm tunneled hemodialysis catheter was inserted without  difficulty. The tip was positioned in the right atrium, at the most cavernous location correlated with intravascular ultrasound imaging. The catheter aspirates and flushes normally. The catheter was flushed with appropriate volume heparin dwells. The catheter exit site was secured with a 0-Silk retention sutures. Both lumens were heparinized. A dressing was placed. The patient tolerated the procedure well without immediate post procedural complication. IMPRESSION: 1. 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 Tortuosity and mild stenosis of the cavoatrial junction. 2. Intravascular ultrasound guided fibrin sheath disruption and repositioning of hemodialysis access, external to the fibrin sheath. 3. Technically successful replacement of 23 cm tunneled hemodialysis catheter with the tip in the right atrium. PLAN: The catheter is ready for immediate use. Ruthann Cancer, MD [facility] Radiology Electronically Signed   By: Ruthann Cancer M.D.   On: 04/29/2021 16:05   IR INTRAVASCULAR ULTRASOUND NON CORONARY  Result Date: 04/29/2021 INDICATION: 71 year old female with history of end-stage renal disease and malfunctioning indwelling right internal jugular tunneled hemodialysis catheter. The patient has previously presented for multiple tunneled line exchanges and central venoplasty. EXAM: 1. TUNNELED CENTRAL VENOUS HEMODIALYSIS CATHETER REPLACEMENT WITH FLUOROSCOPIC GUIDANCE 2. Central venogram 3. Intravascular ultrasound 4. Balloon venoplasty of the right innominate vein and superior vena cava. MEDICATIONS: 2 g Ancef, intravenous Moderate (conscious) sedation was employed during this procedure. A total of Versed 0.5 mg and Fentanyl 25 mcg was administered intravenously. Moderate Sedation Time: 42 minutes. The patient's level of consciousness and vital signs were monitored continuously by radiology nursing throughout the procedure under my  direct supervision. FLUOROSCOPY TIME:  Seven minutes 42 seconds, 65 mGy CONTRAST:  20 mL Omnipaque 300, intravenous COMPLICATIONS: None immediate. PROCEDURE: Informed written consent was obtained from the patient after a discussion of the  risks, benefits, and alternatives to treatment. Questions regarding the procedure were encouraged and answered. The skin and external portion of the existing hemodialysis catheter was prepped with chlorhexidine in a sterile fashion, and a sterile drape was applied covering the operative field. Maximum barrier sterile technique with sterile gowns and gloves were used for the procedure. A timeout was performed prior to the initiation of the procedure. Both lumens of the hemodialysis catheter were cannulated with a stiff Glidewire and advanced to the level of the right atrium. Under intermittent fluoroscopic guidance, the existing dialysis catheter was exchanged for a 12 French, 45 cm vascular sheath with the tip positioned in the right innominate vein, which was inserted over 1 of the glide wires. Central venogram was performed which demonstrated patency of the superior vena cava with brisk antegrade flow. There is mild tortuosity of the central superior vena cava near the cavoatrial junction. Intravascular ultrasound was then utilized to study the central veins. Within the innominate vein, extending into the superior vena cava is an echogenic fibrin sheath surrounding the indwelling catheter track. Balloon venoplasty was then performed of the right innominate vein and superior vena cava with a 10 mm x 8 cm Athletis balloon. Repeat intravascular ultrasound evaluation demonstrated no evidence of vessel rupture, however did demonstrate disruption of the fibrin sheath. Under intravascular ultrasound guidance, the safety glidewire was retracted to the peripheral aspect of the right innominate vein and redirected outside of the fibrin sheath to the normal lumen of the innominate vein and  superior vena cava. The tip of the wire was then positioned in the inferior vena cava. The Rosen wire and sheath were removed. A new, 23 cm tunneled hemodialysis catheter was inserted over the indwelling Glidewire. Unfortunately there was the resistance met and the catheter was unable to be advanced beyond the level of the cavoatrial junction into the right atrium. Therefore, the catheter was removed and the sheath was reinserted. Additional balloon venoplasty was performed about the cavoatrial junction and central SVC with a 14 mm x 4 cm Atlas balloon. The sheath was then again removed and the 23 cm tunneled hemodialysis catheter was inserted without difficulty. The tip was positioned in the right atrium, at the most cavernous location correlated with intravascular ultrasound imaging. The catheter aspirates and flushes normally. The catheter was flushed with appropriate volume heparin dwells. The catheter exit site was secured with a 0-Silk retention sutures. Both lumens were heparinized. A dressing was placed. The patient tolerated the procedure well without immediate post procedural complication. IMPRESSION: 1. 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 Tortuosity and mild stenosis of the cavoatrial junction. 2. Intravascular ultrasound guided fibrin sheath disruption and repositioning of hemodialysis access, external to the fibrin sheath. 3. Technically successful replacement of 23 cm tunneled hemodialysis catheter with the tip in the right atrium. PLAN: The catheter is ready for immediate use. Ruthann Cancer, MD [facility] Radiology Electronically Signed   By: Ruthann Cancer M.D.   On: 04/29/2021 16:05

## 2021-05-02 NOTE — Progress Notes (Signed)
Pt came back from dialysis. Reinitiated tele. VSS. Call bell within reach.  ? ?Lavenia Atlas, RN ? ?

## 2021-05-02 NOTE — Progress Notes (Signed)
Administered cardizem IV 10 mg for pt's HR 112-120s. Pt alert. Will continue to monitor the pt.  ? ?Lavenia Atlas, RN ? ?

## 2021-05-02 NOTE — Progress Notes (Signed)
Pt came back to rm 4e 11 from endoscopy. Reinitiated tele. VS taken. Bed alarm on. Will continue to monitor the pt.  ? ?Lavenia Atlas, RN ? ?

## 2021-05-02 NOTE — Anesthesia Preprocedure Evaluation (Signed)
Anesthesia Evaluation  ?Patient identified by MRN, date of birth, ID band ?Patient awake ? ? ? ?Reviewed: ?Allergy & Precautions, NPO status , [name] Chart, lab work & pertinent test results ? ?Airway ?Mallampati: II ? ?TM Distance: >3 FB ?Neck ROM: Full ? ? ? Dental ?no notable dental hx. ? ?  ?Pulmonary ?former smoker,  ?  ?Pulmonary exam normal ? ? ? ? ? ? ? Cardiovascular ?hypertension, Pt. on medications and Pt. on home beta blockers ?+ Peripheral Vascular Disease and +CHF  ?+ pacemaker  ?Rhythm:Regular Rate:Normal ? ? ?  ?Neuro/Psych ? Headaches, Anxiety Depression CVA   ? GI/Hepatic ?Neg liver ROS, PUD,   ?Endo/Other  ?diabetes, Type 2, Insulin Dependent ? Renal/GU ?negative Renal ROS  ?negative genitourinary ?  ?Musculoskeletal ? ?(+) Arthritis , Osteoarthritis,  Fibromyalgia - ? Abdominal ?Normal abdominal exam  (+)   ?Peds ? Hematology ? ?(+) Blood dyscrasia, anemia ,   ?Anesthesia Other Findings ? ? Reproductive/Obstetrics ? ?  ? ? ? ? ? ? ? ? ? ? ? ? ? ?  ?  ? ? ? ? ? ? ? ? ?Anesthesia Physical ?Anesthesia Plan ? ?ASA: 3 ? ?Anesthesia Plan: MAC  ? ?Post-op Pain Management: Tylenol PO (pre-op)*  ? ?Induction: Intravenous ? ?PONV Risk Score and Plan: 2 and Propofol infusion and Treatment may vary due to age or medical condition ? ?Airway Management Planned: Simple Face Mask, Natural Airway and Nasal Cannula ? ?Additional Equipment: None ? ?Intra-op Plan:  ? ?Post-operative Plan:  ? ?Informed Consent: I have reviewed the patients History and Physical, chart, labs and discussed the procedure including the risks, benefits and alternatives for the proposed anesthesia with the patient or authorized representative who has indicated his/her understanding and acceptance.  ? ? ? ?Dental advisory given ? ?Plan Discussed with:  ? ?Anesthesia Plan Comments: (Lab Results ?     Component                Value               Date                 ?     WBC                      7.3                  05/02/2021           ?     HGB                      8.9 (L)             05/02/2021           ?     HCT                      29.3 (L)            05/02/2021           ?     MCV                      91.3                05/02/2021           ?     PLT  234                 05/02/2021           ?Lab Results ?     Component                Value               Date                 ?     NA                       136                 05/02/2021           ?     K                        3.6                 05/02/2021           ?     CO2                      25                  05/02/2021           ?     GLUCOSE                  121 (H)             05/02/2021           ?     BUN                      20                  05/02/2021           ?     CREATININE               4.06 (H)            05/02/2021           ?     CALCIUM                  8.2 (L)             05/02/2021           ?     GFRNONAA                 11 (L)              05/02/2021          )  ? ? ? ? ? ? ?Anesthesia Quick Evaluation ? ?

## 2021-05-02 NOTE — Transfer of Care (Signed)
Immediate Anesthesia Transfer of Care Note ? ?Patient: Kelli [name] ? ?Procedure(s) Performed: ESOPHAGOGASTRODUODENOSCOPY (EGD) WITH PROPOFOL ?BIOPSY ? ?Patient Location: PACU ? ?Anesthesia Type:MAC ? ?Level of Consciousness: awake and alert  ? ?Airway & Oxygen Therapy: Patient Spontanous Breathing and Patient connected to nasal cannula oxygen ? ?Post-op Assessment: Post -op Vital signs reviewed and stable ? ?Post vital signs: Reviewed and stable ? ?Last Vitals:  ?Vitals Value Taken Time  ?BP 135/93 05/02/21 1334  ?Temp    ?Pulse 103 05/02/21 1337  ?Resp 15 05/02/21 1337  ?SpO2 98 % 05/02/21 1337  ?Vitals shown include unvalidated device data. ? ?Last Pain:  ?Vitals:  ? 05/02/21 1227  ?TempSrc: Temporal  ?PainSc:   ?   ? ?  ? ?Complications: No notable events documented. ?

## 2021-05-02 NOTE — Progress Notes (Signed)
?  [facility] ?Progress Note  ? ?Assessment/ Plan:   ?Outpatient HD prescription:  [facility], TTS, EDW 74.5kg, Revaclr Dialyzer  Time 3:45, Bath 3K/2.5Ca, Access RIJ TDC, BFR 350, DFR 500, Heparin 1500 units bolus then 1200 units/hr ?  ?Micera 150 mcg IVP every 2 weeks, calcitriol 1 mcg qTTS,  ?  ?Assessment/Plan: ?  ?Poorly functioning TDC - exchanged 04/29/21  ?ESRD - normally TTS, HD 3/1 and 3/2.  Next 05/02/21, today. ?Acute on chronic anemia - received blood transfusion and follow.  Hgb was 9.2 on 03/24/21  Gi is following- planning for EGD today ?HTN/Volume - was SOB in ED and requiring supplemental oxygen.  Will UF as tolerated and follow. Covid/flu negative. ?Acute hypoxic respiratory failure - UF as tolerated--> improving ?Atrial fibrillation - Eliquis on hold for procedure ?DM type 2 - per primary ?Disposition - pending ?  ? ?Subjective:   ? ?Seen and examined on HD.  BFR 400 mL/ min via RUE AVF, UF goal   3.5L.  For EGD today as well  ? ?Objective:   ?BP (!) 143/93   Pulse (!) 107   Temp 97.9 ?F (36.6 ?C)   Resp 16   Ht '5\' 5"'$  (1.651 m)   Wt 73.5 kg   SpO2 97%   BMI 26.96 kg/m?  ? ?Physical Exam: ?Gen: chronically ill-appearing, lying in bed ?CVS: RRR ?Resp: some bibasilar crackles but overall improved ?Abd: soft ?Ext: 1+ LUE edema, L sided weakness ? ?Labs: ?BMET ?Recent Labs  ?Lab 04/28/21 ?1303 04/29/21 ?3154 04/30/21 ?0086 05/01/21 ?0138 05/02/21 ?0146  ?NA 139 137 138 132* 136  ?K 3.9 4.2 4.1 4.0 3.6  ?CL 103 99 98 97* 97*  ?CO2 '23 23 25 25 25  '$ ?GLUCOSE 144* 72 109* 110* 121*  ?BUN 38* 44* '19 12 20  '$ ?CREATININE 5.80* 6.73* 3.71* 2.50* 4.06*  ?CALCIUM 6.7* 6.7* 7.6* 7.8* 8.2*  ? ?CBC ?Recent Labs  ?Lab 04/28/21 ?1303 04/29/21 ?7619 04/30/21 ?0146 04/30/21 ?1450 05/01/21 ?0138 05/02/21 ?0146  ?WBC 15.2*   < > 10.7* 8.5 7.4 7.3  ?NEUTROABS 12.2*  --  8.2*  --  5.1 4.6  ?HGB 7.0*   < > 8.3* 7.7* 7.8* 8.9*  ?HCT 23.7*   < > 26.9* 25.5* 25.5* 29.3*  ?MCV 94.0   < > 90.6 92.4 90.7 91.3   ?PLT 270   < > 245 236 187 234  ? < > = values in this interval not displayed.  ? ? ?  ?Medications:   ? ? atorvastatin  40 mg Oral QPM  ? calcium acetate  1,334 mg Oral TID WC  ? Chlorhexidine Gluconate Cloth  6 each Topical Q0600  ? dextrose  50 mL Intravenous Once  ? insulin aspart  0-6 Units Subcutaneous TID WC  ? metoprolol succinate  100 mg Oral QPM  ? pantoprazole (PROTONIX) IV  40 mg Intravenous Q12H  ? venlafaxine XR  150 mg Oral Daily  ? ? ? ?Madelon Lips, MD ?05/02/2021, 8:54 AM   ?

## 2021-05-03 ENCOUNTER — Encounter (HOSPITAL_COMMUNITY): Payer: Self-pay | Admitting: Gastroenterology

## 2021-05-03 DIAGNOSIS — D649 Anemia, unspecified: Secondary | ICD-10-CM | POA: Diagnosis not present

## 2021-05-03 LAB — BRAIN NATRIURETIC PEPTIDE: B Natriuretic Peptide: 1371.9 pg/mL — ABNORMAL HIGH (ref 0.0–100.0)

## 2021-05-03 LAB — GLUCOSE, CAPILLARY
Glucose-Capillary: 110 mg/dL — ABNORMAL HIGH (ref 70–99)
Glucose-Capillary: 110 mg/dL — ABNORMAL HIGH (ref 70–99)
Glucose-Capillary: 116 mg/dL — ABNORMAL HIGH (ref 70–99)
Glucose-Capillary: 129 mg/dL — ABNORMAL HIGH (ref 70–99)

## 2021-05-03 LAB — CBC WITH DIFFERENTIAL/PLATELET
Abs Immature Granulocytes: 0.03 10*3/uL (ref 0.00–0.07)
Basophils Absolute: 0.1 10*3/uL (ref 0.0–0.1)
Basophils Relative: 1 %
Eosinophils Absolute: 0.1 10*3/uL (ref 0.0–0.5)
Eosinophils Relative: 2 %
HCT: 28.6 % — ABNORMAL LOW (ref 36.0–46.0)
Hemoglobin: 8.5 g/dL — ABNORMAL LOW (ref 12.0–15.0)
Immature Granulocytes: 0 %
Lymphocytes Relative: 24 %
Lymphs Abs: 1.8 10*3/uL (ref 0.7–4.0)
MCH: 27.2 pg (ref 26.0–34.0)
MCHC: 29.7 g/dL — ABNORMAL LOW (ref 30.0–36.0)
MCV: 91.7 fL (ref 80.0–100.0)
Monocytes Absolute: 1 10*3/uL (ref 0.1–1.0)
Monocytes Relative: 13 %
Neutro Abs: 4.5 10*3/uL (ref 1.7–7.7)
Neutrophils Relative %: 60 %
Platelets: 219 10*3/uL (ref 150–400)
RBC: 3.12 MIL/uL — ABNORMAL LOW (ref 3.87–5.11)
RDW: 16.9 % — ABNORMAL HIGH (ref 11.5–15.5)
WBC: 7.5 10*3/uL (ref 4.0–10.5)
nRBC: 0 % (ref 0.0–0.2)

## 2021-05-03 LAB — COMPREHENSIVE METABOLIC PANEL
ALT: 7 U/L (ref 0–44)
AST: 10 U/L — ABNORMAL LOW (ref 15–41)
Albumin: 2.5 g/dL — ABNORMAL LOW (ref 3.5–5.0)
Alkaline Phosphatase: 69 U/L (ref 38–126)
Anion gap: 11 (ref 5–15)
BUN: 14 mg/dL (ref 8–23)
CO2: 26 mmol/L (ref 22–32)
Calcium: 8.2 mg/dL — ABNORMAL LOW (ref 8.9–10.3)
Chloride: 102 mmol/L (ref 98–111)
Creatinine, Ser: 3.34 mg/dL — ABNORMAL HIGH (ref 0.44–1.00)
GFR, Estimated: 14 mL/min — ABNORMAL LOW (ref >60)
Glucose, Bld: 113 mg/dL — ABNORMAL HIGH (ref 70–99)
Potassium: 3.5 mmol/L (ref 3.5–5.1)
Sodium: 139 mmol/L (ref 135–145)
Total Bilirubin: 0.5 mg/dL (ref 0.3–1.2)
Total Protein: 5.9 g/dL — ABNORMAL LOW (ref 6.5–8.1)

## 2021-05-03 LAB — MAGNESIUM: Magnesium: 2 mg/dL (ref 1.7–2.4)

## 2021-05-03 MED ORDER — AMLODIPINE BESYLATE 10 MG PO TABS
10.0000 mg | ORAL_TABLET | Freq: Every day | ORAL | Status: DC
  Administered 2021-05-03 – 2021-05-04 (×2): 10 mg via ORAL
  Filled 2021-05-03 (×2): qty 1

## 2021-05-03 MED ORDER — METOPROLOL SUCCINATE ER 100 MG PO TB24
100.0000 mg | ORAL_TABLET | Freq: Every evening | ORAL | Status: DC
  Administered 2021-05-03: 100 mg via ORAL
  Filled 2021-05-03: qty 1

## 2021-05-03 MED ORDER — PANTOPRAZOLE SODIUM 40 MG PO TBEC
40.0000 mg | DELAYED_RELEASE_TABLET | Freq: Two times a day (BID) | ORAL | Status: DC
  Administered 2021-05-03 – 2021-05-04 (×2): 40 mg via ORAL
  Filled 2021-05-03 (×2): qty 1

## 2021-05-03 NOTE — Progress Notes (Signed)
?  [facility] ?Progress Note  ? ?Assessment/ Plan:   ?Outpatient HD prescription:  [facility], TTS, EDW 74.5kg, Revaclr Dialyzer  Time 3:45, Bath 3K/2.5Ca, Access RIJ TDC, BFR 350, DFR 500, Heparin 1500 units bolus then 1200 units/hr ?  ?Micera 150 mcg IVP every 2 weeks, calcitriol 1 mcg qTTS,  ?  ?Assessment/Plan: ?  ?Poorly functioning TDC - exchanged 04/29/21  ?ESRD - normally TTS, HD 3/1 and 3/2. 3/4.  I'll put in for a shorter extra rx tomorrow since she does still have crackles on exam and not sure she can make until Tuesday for next HD.   ?Acute on chronic anemia - received blood transfusion and follow.  Hgb was 9.2 on 03/24/21  Gi is following- EGD 3/4 with gastritis and erythametous duodenopathy ?HTN/Volume - was SOB in ED and requiring supplemental oxygen.  Will UF as tolerated and follow. Covid/flu negative. ?Acute hypoxic respiratory failure - UF as tolerated--> improving ?Atrial fibrillation - restarted Eliquis yesterday ?DM type 2 - per primary ?Disposition - pending ?  ? ?Subjective:   ? ?EGD with gastritis and erythematous duodenopathy.  Hgb stable this AM, Eliquis restarted.  Pt report she thinks she has some trouble breathing but satting well on RA  ? ?Objective:   ?BP (!) 156/101 (BP Location: Right Arm)   Pulse 92   Temp 98.3 ?F (36.8 ?C) (Oral)   Resp 14   Ht '5\' 5"'$  (1.651 m)   Wt 70 kg   SpO2 95%   BMI 25.68 kg/m?  ? ?Physical Exam: ?Gen: chronically ill-appearing, lying in bed ?CVS: RRR ?Resp: some bibasilar crackles still ?Abd: soft ?Ext: 1+ LUE edema, L sided weakness ? ?Labs: ?BMET ?Recent Labs  ?Lab 04/28/21 ?1303 04/29/21 ?2706 04/30/21 ?2376 05/01/21 ?0138 05/02/21 ?2831 05/03/21 ?0259  ?NA 139 137 138 132* 136 139  ?K 3.9 4.2 4.1 4.0 3.6 3.5  ?CL 103 99 98 97* 97* 102  ?CO2 '23 23 25 25 25 26  '$ ?GLUCOSE 144* 72 109* 110* 121* 113*  ?BUN 38* 44* '19 12 20 14  '$ ?CREATININE 5.80* 6.73* 3.71* 2.50* 4.06* 3.34*  ?CALCIUM 6.7* 6.7* 7.6* 7.8* 8.2* 8.2*  ? ?CBC ?Recent Labs  ?Lab  04/30/21 ?0146 04/30/21 ?1450 05/01/21 ?0138 05/02/21 ?0146 05/03/21 ?5176  ?WBC 10.7* 8.5 7.4 7.3 7.5  ?NEUTROABS 8.2*  --  5.1 4.6 4.5  ?HGB 8.3* 7.7* 7.8* 8.9* 8.5*  ?HCT 26.9* 25.5* 25.5* 29.3* 28.6*  ?MCV 90.6 92.4 90.7 91.3 91.7  ?PLT 245 236 187 234 219  ? ? ?  ?Medications:   ? ? amLODipine  10 mg Oral Daily  ? apixaban  5 mg Oral BID  ? atorvastatin  40 mg Oral QPM  ? calcium acetate  1,334 mg Oral TID WC  ? Chlorhexidine Gluconate Cloth  6 each Topical Q0600  ? dextrose  50 mL Intravenous Once  ? insulin aspart  0-6 Units Subcutaneous TID WC  ? metoprolol succinate  100 mg Oral QPM  ? pantoprazole (PROTONIX) IV  40 mg Intravenous Q12H  ? venlafaxine XR  150 mg Oral Daily  ? ? ? ?Madelon Lips, MD ?05/03/2021, 11:22 AM   ?

## 2021-05-03 NOTE — Progress Notes (Signed)
PROGRESS NOTE                                                                                                                                                                                                             Patient Demographics:    Kelli [name], is a 71 y.o. female, DOB - [birth year], YME:[id number]  Outpatient Primary MD for the patient is [name], [name], MD    LOS - 4  Admit date - 04/28/2021    Chief Complaint  Patient presents with   Shortness of Breath       Brief Narrative (HPI from H&P)   71 year old female with history of ESRD on TTS schedule who lives in [place], [place] type II, chronic diastolic CHF, paroxysmal atrial fibrillation [place] vas 2 score of greater than 3 on Eliquis, essential hypertension, dyslipidemia and anxiety.  Patient presented to [facility], ER yesterday after she had issues with her HD dialysis catheter and she could only have 500 cc of fluid removed on 04/28/2021.  At the time of presentation to [facility], ER she was noted to have a hemoglobin of 7, mild leukocytosis, she was admitted for HD catheter exchange.  From [facility] she was transferred to [facility] for HD catheter exchange on 04/29/2021 she was noted to have a hemoglobin of 5.9 this morning at [facility] and a unit of blood transfusion was started, upon arrival to [facility], IR department she was noted to have 1 unit of packed RBC transfusion running, she also had bilateral crackles and had difficulty laying flat.  I was called to evaluate the patient in the IR department for further   Subjective:   Patient in bed, appears comfortable, denies any headache, no fever, no chest pain or pressure, no shortness of breath , no abdominal pain. No new focal weakness.    Assessment  & Plan :    Acute hypoxic respiratory failure due to acute on chronic diastolic CHF from missed dialysis treatment, HD catheter malfunction at [facility], worsened by acute on chronic anemia.   She had her HD catheter is exchanged 04/29/21.  Underwent under urgent HD on 04/29/2021, shortness of breath much improved, renal following, on TTS schedule. Resolved.   ESRD.  On TTS schedule.   TTS schedule renal following  Acute anemia in the  presence of anemia of chronic disease in a patient who is on Eliquis.  No history of melena or blood in stool, she was on Eliquis, no signs of brisk ongoing GI bleed, s/p 1 unit of packed RBC transfusion on 04/29/2021 with stable posttransfusion H&H, continue PPI.  Her Eliquis was held she was seen by GI underwent EGD showing gastritis and Erythematous duodenopathy.  As discussed with Dr. Benson Norway on 05/02/2021.  Okay to resume Eliquis.  Continue to monitor H&H another 24 hours after resumption of Eliquis.  Post discharge follow-up with her gastroenterologist or Dr. Benson Norway in 1 to 2 weeks.  History of stroke with dense left upper extremity hemiparesis and mild lower extremity weakness.  Continue supportive care.  Eliquis as above, continue statin for secondary prevention.  Essential hypertension.  On beta-blocker continue and monitor.  Paroxysmal atrial fibrillation [place] vas 2 score of greater than 5.  On beta-blocker which will be continued Eliquis as above.  Acute on chronic diastolic CHF EF 01% on echocardiogram done in November 2022.  This is due to missed dialysis treatment kindly see plan in #1 above continue beta-blocker for proper diastolic refilling.  Obesity.  BMI 31.  Follow with PCP for weight loss.  Mild metabolic encephalopathy.  Upon talking to the husband it seems like patient is developing some early dementia or age-related cognitive decline, she has some acute metabolic encephalopathy on top of it due to missed HD treatment, anemia and being in unfamiliar setting.  Encephalopathy improved.  Dyslipidemia.  On statin  DM type II.  Sliding scale.  Lab Results  Component Value Date   HGBA1C 4.9  04/29/2021    CBG (last 3)  Recent Labs    05/02/21 1655 05/02/21 2113 05/03/21 0618  GLUCAP 179* 93 110*          Condition - Extremely Guarded  Family Communication  : Husband Kelli [name] over the phone on 04/30/2021, 05/02/21  Code Status :  Full  Consults  :  IR, GI, Renal  PUD Prophylaxis : PPI   Procedures  :     EGD done by Dr. Benson Norway 05/02/2021 showing   EGD showing gastritis and Erythematous duodenopathy  New dialysis catheter to be placed 04/29/2021      Disposition Plan  :    Status is: Observation  DVT Prophylaxis  :    Place and maintain sequential compression device Start: 04/29/21 1224 apixaban (ELIQUIS) tablet 5 mg    Lab Results  Component Value Date   PLT 219 05/03/2021    Diet :  Diet Order             DIET SOFT Room service appropriate? Yes with Assist; Fluid consistency: Nectar Thick  Diet effective now                    Inpatient Medications  Scheduled Meds:  amLODipine  10 mg Oral Daily   apixaban  5 mg Oral BID   atorvastatin  40 mg Oral QPM   calcium acetate  1,334 mg Oral TID WC   Chlorhexidine Gluconate Cloth  6 each Topical Q0600   dextrose  50 mL Intravenous Once   insulin aspart  0-6 Units Subcutaneous TID WC   metoprolol succinate  100 mg Oral QPM   pantoprazole (PROTONIX) IV  40 mg Intravenous Q12H   venlafaxine XR  150 mg Oral Daily   Continuous Infusions:   PRN Meds:.acetaminophen **OR** acetaminophen, diltiazem, hydrALAZINE, ondansetron **OR** ondansetron (ZOFRAN) IV, traMADol  Antibiotics  :    Anti-infectives (From admission, onward)    Start     Dose/Rate Route Frequency Ordered Stop   04/29/21 1430  ceFAZolin (ANCEF) IVPB 2g/100 mL premix        2 g 200 mL/hr over 30 Minutes Intravenous  Once 04/29/21 1332 04/29/21 1402        Time Spent in minutes  30   Lala Lund M.D on 05/03/2021 at 8:50 AM  To page go to [email or web address]   [facility] -  Office  [phone]  See all Orders from  today for further details    Objective:   Vitals:   05/03/21 0007 05/03/21 0400 05/03/21 0404 05/03/21 0720  BP: (!) 186/54 (!) 172/76  (!) 156/101  Pulse:  93  92  Resp:  20  14  Temp:  98.2 F (36.8 C)  98.3 F (36.8 C)  TempSrc:  Oral  Oral  SpO2:  94%  95%  Weight:   70 kg   Height:        Wt Readings from Last 3 Encounters:  05/03/21 70 kg  01/17/21 71 kg  11/21/20 75.3 kg     Intake/Output Summary (Last 24 hours) at 05/03/2021 0850 Last data filed at 05/03/2021 0401 Gross per 24 hour  Intake 170 ml  Output 2800 ml  Net -2630 ml     Physical Exam  Awake Alert x2 , No new F.N deficits, Right IJ hemodialysis catheter. Chronic left-sided hemiparesis arm more than leg Floyd.AT,PERRAL Supple Neck, No JVD,   Symmetrical Chest wall movement, Good air movement bilaterally, CTAB RRR,No Gallops, Rubs or new Murmurs,  +ve B.Sounds, Abd Soft, No tenderness,   No Cyanosis, Clubbing or edema     RN pressure injury documentation: Pressure Ulcer 04/26/14 Stage II -  Partial thickness loss of dermis presenting as a shallow open ulcer with a red, pink wound bed without slough. pt states this comes from wears a pad in her depends at home (Active)  04/26/14 1503  Location: Sacrum  Location Orientation: Mid  Staging: Stage II -  Partial thickness loss of dermis presenting as a shallow open ulcer with a red, pink wound bed without slough.  Wound Description (Comments): pt states this comes from wears a pad in her depends at home  Present on Admission: Yes     Pressure Injury 09/11/19 Buttocks Left Stage 2 -  Partial thickness loss of dermis presenting as a shallow open injury with a red, pink wound bed without slough. (Active)  09/11/19 2200  Location: Buttocks  Location Orientation: Left  Staging: Stage 2 -  Partial thickness loss of dermis presenting as a shallow open injury with a red, pink wound bed without slough.  Wound Description (Comments):   Present on Admission: Yes      Pressure Injury 01/15/21 Sacrum Lower;Medial Stage 1 -  Intact skin with non-blanchable redness of a localized area usually over a bony prominence. Stage 1, healed pressure injury to right sacrum (Active)  01/15/21 0631  Location: Sacrum  Location Orientation: Lower;Medial  Staging: Stage 1 -  Intact skin with non-blanchable redness of a localized area usually over a bony prominence.  Wound Description (Comments): Stage 1, healed pressure injury to right sacrum  Present on Admission: Yes     Data Review:    CBC Recent Labs  Lab 04/28/21 1303 04/29/21 0555 04/30/21 0146 04/30/21 1450 05/01/21 0138 05/02/21 0146 05/03/21 0259  WBC 15.2*   < > 10.7* 8.5 7.4  7.3 7.5  HGB 7.0*   < > 8.3* 7.7* 7.8* 8.9* 8.5*  HCT 23.7*   < > 26.9* 25.5* 25.5* 29.3* 28.6*  PLT 270   < > 245 236 187 234 219  MCV 94.0   < > 90.6 92.4 90.7 91.3 91.7  MCH 27.8   < > 27.9 27.9 27.8 27.7 27.2  MCHC 29.5*   < > 30.9 30.2 30.6 30.4 29.7*  RDW 18.7*   < > 17.2* 17.4* 17.2* 16.9* 16.9*  LYMPHSABS 1.1  --  1.3  --  1.2 1.6 1.8  MONOABS 1.5*  --  1.1*  --  0.9 0.9 1.0  EOSABS 0.2  --  0.0  --  0.2 0.2 0.1  BASOSABS 0.0  --  0.0  --  0.0 0.0 0.1   < > = values in this interval not displayed.    Electrolytes Recent Labs  Lab 04/29/21 0555 04/30/21 0146 05/01/21 0138 05/02/21 0146 05/02/21 0229 05/03/21 0259  NA 137 138 132* 136  --  139  K 4.2 4.1 4.0 3.6  --  3.5  CL 99 98 97* 97*  --  102  CO2 _0 --  26  GLUCOSE 72 109* 110* 121*  --  113*  BUN 44* _1 --  14  CREATININE 6.73* 3.71* 2.50* 4.06*  --  3.34*  CALCIUM 6.7* 7.6* 7.8* 8.2*  --  8.2*  AST  --  11* 25 12*  --  10*  ALT  --  _2 --  7  ALKPHOS  --  78 74 77  --  69  BILITOT  --  0.9 1.8* 0.3  --  0.5  ALBUMIN  --  2.4* 2.3* 2.6*  --  2.5*  MG 1.9 1.7 1.9 1.8  --  2.0  HGBA1C 4.9  --   --   --   --   --   BNP  --  1,895.2* 1,332.2*  --  1,510.7* 1,371.9*     ------------------------------------------------------------------------------------------------------------------ No results for input(s): CHOL, HDL, LDLCALC, TRIG, CHOLHDL, LDLDIRECT in the last 72 hours.  Lab Results  Component Value Date   HGBA1C 4.9 04/29/2021    No results for input(s): TSH, T4TOTAL, T3FREE, THYROIDAB in the last 72 hours.  Invalid input(s): FREET3 ------------------------------------------------------------------------------------------------------------------ ID Labs Recent Labs  Lab 04/29/21 0555 04/29/21 2153 04/30/21 0146 04/30/21 1450 05/01/21 0138 05/02/21 0146 05/03/21 0259  WBC 9.2   < > 10.7* 8.5 7.4 7.3 7.5  PLT 232   < > 245 236 187 234 219  CREATININE 6.73*  --  3.71*  --  2.50* 4.06* 3.34*   < > = values in this interval not displayed.   Cardiac Enzymes No results for input(s): CKMB, TROPONINI, MYOGLOBIN in the last 168 hours.  Invalid input(s): CK   Radiology Reports IR Venocavagram Svc  Result Date: 04/29/2021 INDICATION: 71 year old female with history of end-stage renal disease and malfunctioning indwelling right internal jugular tunneled hemodialysis catheter. The patient has previously presented for multiple tunneled line exchanges and central venoplasty. EXAM: 1. TUNNELED CENTRAL VENOUS HEMODIALYSIS CATHETER REPLACEMENT WITH FLUOROSCOPIC GUIDANCE 2. Central venogram 3. Intravascular ultrasound 4. Balloon venoplasty of the right innominate vein and superior vena cava. MEDICATIONS: 2 g Ancef, intravenous Moderate (conscious) sedation was employed during this procedure. A total of Versed 0.5 mg and Fentanyl 25 mcg was administered intravenously. Moderate Sedation Time: 42 minutes. The patient's level of consciousness and vital signs  were monitored continuously by radiology nursing throughout the procedure under my direct supervision. FLUOROSCOPY TIME:  Seven minutes 42 seconds, 65 mGy CONTRAST:  20 mL Omnipaque 300, intravenous  COMPLICATIONS: None immediate. PROCEDURE: Informed written consent was obtained from the patient after a discussion of the risks, benefits, and alternatives to treatment. Questions regarding the procedure were encouraged and answered. The skin and external portion of the existing hemodialysis catheter was prepped with chlorhexidine in a sterile fashion, and a sterile drape was applied covering the operative field. Maximum barrier sterile technique with sterile gowns and gloves were used for the procedure. A timeout was performed prior to the initiation of the procedure. Both lumens of the hemodialysis catheter were cannulated with a stiff Glidewire and advanced to the level of the right atrium. Under intermittent fluoroscopic guidance, the existing dialysis catheter was exchanged for a 12 French, 45 cm vascular sheath with the tip positioned in the right innominate vein, which was inserted over 1 of the glide wires. Central venogram was performed which demonstrated patency of the superior vena cava with brisk antegrade flow. There is mild tortuosity of the central superior vena cava near the cavoatrial junction. Intravascular ultrasound was then utilized to study the central veins. Within the innominate vein, extending into the superior vena cava is an echogenic fibrin sheath surrounding the indwelling catheter track. Balloon venoplasty was then performed of the right innominate vein and superior vena cava with a 10 mm x 8 cm Athletis balloon. Repeat intravascular ultrasound evaluation demonstrated no evidence of vessel rupture, however did demonstrate disruption of the fibrin sheath. Under intravascular ultrasound guidance, the safety glidewire was retracted to the peripheral aspect of the right innominate vein and redirected outside of the fibrin sheath to the normal lumen of the innominate vein and superior vena cava. The tip of the wire was then positioned in the inferior vena cava. The Rosen wire and sheath  were removed. A new, 23 cm tunneled hemodialysis catheter was inserted over the indwelling Glidewire. Unfortunately there was the resistance met and the catheter was unable to be advanced beyond the level of the cavoatrial junction into the right atrium. Therefore, the catheter was removed and the sheath was reinserted. Additional balloon venoplasty was performed about the cavoatrial junction and central SVC with a 14 mm x 4 cm Atlas balloon. The sheath was then again removed and the 23 cm tunneled hemodialysis catheter was inserted without difficulty. The tip was positioned in the right atrium, at the most cavernous location correlated with intravascular ultrasound imaging. The catheter aspirates and flushes normally. The catheter was flushed with appropriate volume heparin dwells. The catheter exit site was secured with a 0-Silk retention sutures. Both lumens were heparinized. A dressing was placed. The patient tolerated the procedure well without immediate post procedural complication. IMPRESSION: 1. 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 Tortuosity and mild stenosis of the cavoatrial junction. 2. Intravascular ultrasound guided fibrin sheath disruption and repositioning of hemodialysis access, external to the fibrin sheath. 3. Technically successful replacement of 23 cm tunneled hemodialysis catheter with the tip in the right atrium. PLAN: The catheter is ready for immediate use. Ruthann Cancer, MD [facility] Radiology Electronically Signed   By: Ruthann Cancer M.D.   On: 04/29/2021 16:05   IR Fluoro Guide CV Line Right  Result Date: 04/29/2021 INDICATION: 71 year old female with history of end-stage renal disease and malfunctioning indwelling right internal jugular tunneled hemodialysis catheter. The patient has previously presented for multiple  tunneled line exchanges and central venoplasty. EXAM: 1. TUNNELED CENTRAL VENOUS  HEMODIALYSIS CATHETER REPLACEMENT WITH FLUOROSCOPIC GUIDANCE 2. Central venogram 3. Intravascular ultrasound 4. Balloon venoplasty of the right innominate vein and superior vena cava. MEDICATIONS: 2 g Ancef, intravenous Moderate (conscious) sedation was employed during this procedure. A total of Versed 0.5 mg and Fentanyl 25 mcg was administered intravenously. Moderate Sedation Time: 42 minutes. The patient's level of consciousness and vital signs were monitored continuously by radiology nursing throughout the procedure under my direct supervision. FLUOROSCOPY TIME:  Seven minutes 42 seconds, 65 mGy CONTRAST:  20 mL Omnipaque 300, intravenous COMPLICATIONS: None immediate. PROCEDURE: Informed written consent was obtained from the patient after a discussion of the risks, benefits, and alternatives to treatment. Questions regarding the procedure were encouraged and answered. The skin and external portion of the existing hemodialysis catheter was prepped with chlorhexidine in a sterile fashion, and a sterile drape was applied covering the operative field. Maximum barrier sterile technique with sterile gowns and gloves were used for the procedure. A timeout was performed prior to the initiation of the procedure. Both lumens of the hemodialysis catheter were cannulated with a stiff Glidewire and advanced to the level of the right atrium. Under intermittent fluoroscopic guidance, the existing dialysis catheter was exchanged for a 12 French, 45 cm vascular sheath with the tip positioned in the right innominate vein, which was inserted over 1 of the glide wires. Central venogram was performed which demonstrated patency of the superior vena cava with brisk antegrade flow. There is mild tortuosity of the central superior vena cava near the cavoatrial junction. Intravascular ultrasound was then utilized to study the central veins. Within the innominate vein, extending into the superior vena cava is an echogenic fibrin sheath  surrounding the indwelling catheter track. Balloon venoplasty was then performed of the right innominate vein and superior vena cava with a 10 mm x 8 cm Athletis balloon. Repeat intravascular ultrasound evaluation demonstrated no evidence of vessel rupture, however did demonstrate disruption of the fibrin sheath. Under intravascular ultrasound guidance, the safety glidewire was retracted to the peripheral aspect of the right innominate vein and redirected outside of the fibrin sheath to the normal lumen of the innominate vein and superior vena cava. The tip of the wire was then positioned in the inferior vena cava. The Rosen wire and sheath were removed. A new, 23 cm tunneled hemodialysis catheter was inserted over the indwelling Glidewire. Unfortunately there was the resistance met and the catheter was unable to be advanced beyond the level of the cavoatrial junction into the right atrium. Therefore, the catheter was removed and the sheath was reinserted. Additional balloon venoplasty was performed about the cavoatrial junction and central SVC with a 14 mm x 4 cm Atlas balloon. The sheath was then again removed and the 23 cm tunneled hemodialysis catheter was inserted without difficulty. The tip was positioned in the right atrium, at the most cavernous location correlated with intravascular ultrasound imaging. The catheter aspirates and flushes normally. The catheter was flushed with appropriate volume heparin dwells. The catheter exit site was secured with a 0-Silk retention sutures. Both lumens were heparinized. A dressing was placed. The patient tolerated the procedure well without immediate post procedural complication. IMPRESSION: 1. 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 Tortuosity and mild stenosis of the cavoatrial junction. 2. Intravascular ultrasound guided fibrin sheath disruption and repositioning of hemodialysis access, external to the fibrin sheath. 3. Technically  successful replacement of 23 cm tunneled hemodialysis catheter with  the tip in the right atrium. PLAN: The catheter is ready for immediate use. Ruthann Cancer, MD [facility] Radiology Electronically Signed   By: Ruthann Cancer M.D.   On: 04/29/2021 16:05   DG Chest Port 1 View  Result Date: 05/01/2021 CLINICAL DATA:  Shortness of breath and cough. EXAM: PORTABLE CHEST 1 VIEW COMPARISON:  04/29/2021 FINDINGS: 0632 hours. The cardio pericardial silhouette is enlarged. Right IJ central line tip overlies the right atrium. There is persistent retrocardiac left base collapse/consolidative opacity with more prominent airspace disease in the left mid lung on today's study. Probable small left effusion. Right lung clear. Telemetry leads overlie the chest. IMPRESSION: Interval development of airspace disease in the left mid lung. Persistent retrocardiac left base collapse/consolidative opacity with probable small left effusion. Electronically Signed   By: Misty Stanley M.D.   On: 05/01/2021 07:47   DG Chest Port 1 View  Result Date: 04/29/2021 CLINICAL DATA:  Shortness of breath, confusion, missed dialysis for 1 week, history hypertension, diabetes mellitus, stroke, end-stage renal disease EXAM: PORTABLE CHEST 1 VIEW COMPARISON:  Portable exam 1537 hours compared to 04/28/2021 FINDINGS: RIGHT jugular line with tip projecting over RIGHT atrium. Enlargement of cardiac silhouette with pulmonary vascular congestion. Loop recorder versus leadless pacemaker projecting over LEFT heart. Atherosclerotic calcification aorta. RIGHT basilar atelectasis. Atelectasis versus consolidation LEFT lower lobe. No definite pleural effusion or pneumothorax. Vascular stents in the upper arms bilaterally. IMPRESSION: Atelectasis versus consolidation at LEFT lung base with mild RIGHT basilar atelectasis. Enlargement of cardiac silhouette with pulmonary vascular congestion. Aortic Atherosclerosis  (ICD10-I70.0). Electronically Signed   By: Lavonia Dana M.D.   On: 04/29/2021 15:53   IR PTA VENOUS EXCEPT DIALYSIS CIRCUIT  Result Date: 04/29/2021 INDICATION: 71 year old female with history of end-stage renal disease and malfunctioning indwelling right internal jugular tunneled hemodialysis catheter. The patient has previously presented for multiple tunneled line exchanges and central venoplasty. EXAM: 1. TUNNELED CENTRAL VENOUS HEMODIALYSIS CATHETER REPLACEMENT WITH FLUOROSCOPIC GUIDANCE 2. Central venogram 3. Intravascular ultrasound 4. Balloon venoplasty of the right innominate vein and superior vena cava. MEDICATIONS: 2 g Ancef, intravenous Moderate (conscious) sedation was employed during this procedure. A total of Versed 0.5 mg and Fentanyl 25 mcg was administered intravenously. Moderate Sedation Time: 42 minutes. The patient's level of consciousness and vital signs were monitored continuously by radiology nursing throughout the procedure under my direct supervision. FLUOROSCOPY TIME:  Seven minutes 42 seconds, 65 mGy CONTRAST:  20 mL Omnipaque 300, intravenous COMPLICATIONS: None immediate. PROCEDURE: Informed written consent was obtained from the patient after a discussion of the risks, benefits, and alternatives to treatment. Questions regarding the procedure were encouraged and answered. The skin and external portion of the existing hemodialysis catheter was prepped with chlorhexidine in a sterile fashion, and a sterile drape was applied covering the operative field. Maximum barrier sterile technique with sterile gowns and gloves were used for the procedure. A timeout was performed prior to the initiation of the procedure. Both lumens of the hemodialysis catheter were cannulated with a stiff Glidewire and advanced to the level of the right atrium. Under intermittent fluoroscopic guidance, the existing dialysis catheter was exchanged for a 12 French, 45 cm vascular sheath with the tip positioned in the  right innominate vein, which was inserted over 1 of the glide wires. Central venogram was performed which demonstrated patency of the superior vena cava with brisk antegrade flow. There is mild tortuosity of the central superior vena cava near the cavoatrial junction.  Intravascular ultrasound was then utilized to study the central veins. Within the innominate vein, extending into the superior vena cava is an echogenic fibrin sheath surrounding the indwelling catheter track. Balloon venoplasty was then performed of the right innominate vein and superior vena cava with a 10 mm x 8 cm Athletis balloon. Repeat intravascular ultrasound evaluation demonstrated no evidence of vessel rupture, however did demonstrate disruption of the fibrin sheath. Under intravascular ultrasound guidance, the safety glidewire was retracted to the peripheral aspect of the right innominate vein and redirected outside of the fibrin sheath to the normal lumen of the innominate vein and superior vena cava. The tip of the wire was then positioned in the inferior vena cava. The Rosen wire and sheath were removed. A new, 23 cm tunneled hemodialysis catheter was inserted over the indwelling Glidewire. Unfortunately there was the resistance met and the catheter was unable to be advanced beyond the level of the cavoatrial junction into the right atrium. Therefore, the catheter was removed and the sheath was reinserted. Additional balloon venoplasty was performed about the cavoatrial junction and central SVC with a 14 mm x 4 cm Atlas balloon. The sheath was then again removed and the 23 cm tunneled hemodialysis catheter was inserted without difficulty. The tip was positioned in the right atrium, at the most cavernous location correlated with intravascular ultrasound imaging. The catheter aspirates and flushes normally. The catheter was flushed with appropriate volume heparin dwells. The catheter exit site was secured with a 0-Silk retention sutures.  Both lumens were heparinized. A dressing was placed. The patient tolerated the procedure well without immediate post procedural complication. IMPRESSION: 1. 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 Tortuosity and mild stenosis of the cavoatrial junction. 2. Intravascular ultrasound guided fibrin sheath disruption and repositioning of hemodialysis access, external to the fibrin sheath. 3. Technically successful replacement of 23 cm tunneled hemodialysis catheter with the tip in the right atrium. PLAN: The catheter is ready for immediate use. Ruthann Cancer, MD [facility] Radiology Electronically Signed   By: Ruthann Cancer M.D.   On: 04/29/2021 16:05   IR INTRAVASCULAR ULTRASOUND NON CORONARY  Result Date: 04/29/2021 INDICATION: 71 year old female with history of end-stage renal disease and malfunctioning indwelling right internal jugular tunneled hemodialysis catheter. The patient has previously presented for multiple tunneled line exchanges and central venoplasty. EXAM: 1. TUNNELED CENTRAL VENOUS HEMODIALYSIS CATHETER REPLACEMENT WITH FLUOROSCOPIC GUIDANCE 2. Central venogram 3. Intravascular ultrasound 4. Balloon venoplasty of the right innominate vein and superior vena cava. MEDICATIONS: 2 g Ancef, intravenous Moderate (conscious) sedation was employed during this procedure. A total of Versed 0.5 mg and Fentanyl 25 mcg was administered intravenously. Moderate Sedation Time: 42 minutes. The patient's level of consciousness and vital signs were monitored continuously by radiology nursing throughout the procedure under my direct supervision. FLUOROSCOPY TIME:  Seven minutes 42 seconds, 65 mGy CONTRAST:  20 mL Omnipaque 300, intravenous COMPLICATIONS: None immediate. PROCEDURE: Informed written consent was obtained from the patient after a discussion of the risks, benefits, and alternatives to treatment. Questions regarding the procedure  were encouraged and answered. The skin and external portion of the existing hemodialysis catheter was prepped with chlorhexidine in a sterile fashion, and a sterile drape was applied covering the operative field. Maximum barrier sterile technique with sterile gowns and gloves were used for the procedure. A timeout was performed prior to the initiation of the procedure. Both lumens of the hemodialysis catheter were cannulated with a stiff Glidewire and advanced  to the level of the right atrium. Under intermittent fluoroscopic guidance, the existing dialysis catheter was exchanged for a 12 French, 45 cm vascular sheath with the tip positioned in the right innominate vein, which was inserted over 1 of the glide wires. Central venogram was performed which demonstrated patency of the superior vena cava with brisk antegrade flow. There is mild tortuosity of the central superior vena cava near the cavoatrial junction. Intravascular ultrasound was then utilized to study the central veins. Within the innominate vein, extending into the superior vena cava is an echogenic fibrin sheath surrounding the indwelling catheter track. Balloon venoplasty was then performed of the right innominate vein and superior vena cava with a 10 mm x 8 cm Athletis balloon. Repeat intravascular ultrasound evaluation demonstrated no evidence of vessel rupture, however did demonstrate disruption of the fibrin sheath. Under intravascular ultrasound guidance, the safety glidewire was retracted to the peripheral aspect of the right innominate vein and redirected outside of the fibrin sheath to the normal lumen of the innominate vein and superior vena cava. The tip of the wire was then positioned in the inferior vena cava. The Rosen wire and sheath were removed. A new, 23 cm tunneled hemodialysis catheter was inserted over the indwelling Glidewire. Unfortunately there was the resistance met and the catheter was unable to be advanced beyond the level of  the cavoatrial junction into the right atrium. Therefore, the catheter was removed and the sheath was reinserted. Additional balloon venoplasty was performed about the cavoatrial junction and central SVC with a 14 mm x 4 cm Atlas balloon. The sheath was then again removed and the 23 cm tunneled hemodialysis catheter was inserted without difficulty. The tip was positioned in the right atrium, at the most cavernous location correlated with intravascular ultrasound imaging. The catheter aspirates and flushes normally. The catheter was flushed with appropriate volume heparin dwells. The catheter exit site was secured with a 0-Silk retention sutures. Both lumens were heparinized. A dressing was placed. The patient tolerated the procedure well without immediate post procedural complication. IMPRESSION: 1. 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 Tortuosity and mild stenosis of the cavoatrial junction. 2. Intravascular ultrasound guided fibrin sheath disruption and repositioning of hemodialysis access, external to the fibrin sheath. 3. Technically successful replacement of 23 cm tunneled hemodialysis catheter with the tip in the right atrium. PLAN: The catheter is ready for immediate use. Ruthann Cancer, MD [facility] Radiology Electronically Signed   By: Ruthann Cancer M.D.   On: 04/29/2021 16:05

## 2021-05-04 ENCOUNTER — Other Ambulatory Visit (HOSPITAL_COMMUNITY): Payer: Self-pay

## 2021-05-04 ENCOUNTER — Encounter (HOSPITAL_COMMUNITY): Payer: Self-pay | Admitting: Oncology

## 2021-05-04 DIAGNOSIS — D649 Anemia, unspecified: Secondary | ICD-10-CM | POA: Diagnosis not present

## 2021-05-04 DIAGNOSIS — J810 Acute pulmonary edema: Secondary | ICD-10-CM | POA: Diagnosis not present

## 2021-05-04 LAB — CBC
HCT: 26.2 % — ABNORMAL LOW (ref 36.0–46.0)
Hemoglobin: 8.5 g/dL — ABNORMAL LOW (ref 12.0–15.0)
MCH: 29.1 pg (ref 26.0–34.0)
MCHC: 32.4 g/dL (ref 30.0–36.0)
MCV: 89.7 fL (ref 80.0–100.0)
Platelets: 218 10*3/uL (ref 150–400)
RBC: 2.92 MIL/uL — ABNORMAL LOW (ref 3.87–5.11)
RDW: 17.6 % — ABNORMAL HIGH (ref 11.5–15.5)
WBC: 7.2 10*3/uL (ref 4.0–10.5)
nRBC: 0 % (ref 0.0–0.2)

## 2021-05-04 LAB — RENAL FUNCTION PANEL
Albumin: 2.4 g/dL — ABNORMAL LOW (ref 3.5–5.0)
Anion gap: 10 (ref 5–15)
BUN: 25 mg/dL — ABNORMAL HIGH (ref 8–23)
CO2: 26 mmol/L (ref 22–32)
Calcium: 8.1 mg/dL — ABNORMAL LOW (ref 8.9–10.3)
Chloride: 103 mmol/L (ref 98–111)
Creatinine, Ser: 5.46 mg/dL — ABNORMAL HIGH (ref 0.44–1.00)
GFR, Estimated: 8 mL/min — ABNORMAL LOW (ref >60)
Glucose, Bld: 123 mg/dL — ABNORMAL HIGH (ref 70–99)
Phosphorus: 4.1 mg/dL (ref 2.5–4.6)
Potassium: 3.4 mmol/L — ABNORMAL LOW (ref 3.5–5.1)
Sodium: 139 mmol/L (ref 135–145)

## 2021-05-04 LAB — GLUCOSE, CAPILLARY
Glucose-Capillary: 114 mg/dL — ABNORMAL HIGH (ref 70–99)
Glucose-Capillary: 102 mg/dL — ABNORMAL HIGH (ref 70–99)

## 2021-05-04 MED ORDER — HEPARIN SODIUM (PORCINE) 1000 UNIT/ML IJ SOLN: 3800.0000 [IU] | Freq: Once | INTRAMUSCULAR | Status: DC

## 2021-05-04 MED ORDER — APIXABAN 5 MG PO TABS
5.0000 mg | ORAL_TABLET | Freq: Two times a day (BID) | ORAL | 0 refills | Status: AC
  Filled 2021-05-04: qty 60, 30d supply, fill #0

## 2021-05-04 MED ORDER — HEPARIN SODIUM (PORCINE) 1000 UNIT/ML IJ SOLN
INTRAMUSCULAR | Status: AC
  Filled 2021-05-04: qty 4

## 2021-05-04 MED ORDER — AMLODIPINE BESYLATE 10 MG PO TABS
10.0000 mg | ORAL_TABLET | Freq: Every day | ORAL | 0 refills | Status: AC
  Filled 2021-05-04: qty 30, 30d supply, fill #0

## 2021-05-04 MED ORDER — PANTOPRAZOLE SODIUM 40 MG PO TBEC
40.0000 mg | DELAYED_RELEASE_TABLET | Freq: Two times a day (BID) | ORAL | 0 refills | Status: AC
  Filled 2021-05-04: qty 60, 30d supply, fill #0

## 2021-05-04 NOTE — Progress Notes (Signed)
Pt came back to rm 11 from dialysis. Reinitiated tele. VS take. Call bell within reach.  ? ?Lavenia Atlas, RN ? ?

## 2021-05-04 NOTE — Progress Notes (Signed)
?  [facility] ?Progress Note  ? ?Assessment/ Plan:   ?Outpatient HD prescription:  [facility], TTS, EDW 74.5kg, Revaclr Dialyzer  Time 3:45, Bath 3K/2.5Ca, Access RIJ TDC, BFR 350, DFR 500, Heparin 1500 units bolus then 1200 units/hr ?  ?Micera 150 mcg IVP every 2 weeks, calcitriol 1 mcg qTTS,  ?  ?Assessment/Plan: ?Poorly functioning TDC - exchanged 04/29/21 ?ESRD - usual HD TTS.  HD today off schedule for possible vol overload.  ?Acute on chronic anemia - received blood transfusion and follow.  Hgb was 9.2 on 03/24/21  Gi is following- EGD 3/4 with gastritis and erythametous duodenopathy ?HTN/Volume - was SOB in ED and requiring supplemental oxygen.  Will UF as tolerated and follow. Covid/flu negative. ?Acute hypoxic respiratory failure - UF as tolerated--> improving ?Atrial fibrillation - restarted Eliquis yesterday ?DM type 2 - per primary ?Disposition - for dc today. DC summary faxed to OP HD unit for the HD SW.   ? ? ?Kelly Splinter, MD ?05/04/2021, 1:24 PM ? ? ? ? ?  ? ?Subjective:   ? ?Seen on HD, no c/o's  ? ?Objective:   ?BP (!) 175/73 (BP Location: Right Arm)   Pulse 91   Temp (!) 97.5 ?F (36.4 ?C) (Oral)   Resp 20   Ht '5\' 5"'$  (1.651 m)   Wt 67.5 kg   SpO2 98%   BMI 24.76 kg/m?  ? ?Physical Exam: ?Gen: chronically ill-appearing, lying in bed ?CVS: RRR ?Resp: some bibasilar crackles still ?Abd: soft ?Ext: 1+ LUE edema, L sided weakness ? ?Labs: ?BMET ?Recent Labs  ?Lab 04/28/21 ?1303 04/29/21 ?3149 04/30/21 ?7026 05/01/21 ?0138 05/02/21 ?3785 05/03/21 ?8850 05/04/21 ?0745  ?NA 139 137 138 132* 136 139 139  ?K 3.9 4.2 4.1 4.0 3.6 3.5 3.4*  ?CL 103 99 98 97* 97* 102 103  ?CO2 '23 23 25 25 25 26 26  '$ ?GLUCOSE 144* 72 109* 110* 121* 113* 123*  ?BUN 38* 44* '19 12 20 14 '$ 25*  ?CREATININE 5.80* 6.73* 3.71* 2.50* 4.06* 3.34* 5.46*  ?CALCIUM 6.7* 6.7* 7.6* 7.8* 8.2* 8.2* 8.1*  ?PHOS  --   --   --   --   --   --  4.1  ? ? ?CBC ?Recent Labs  ?Lab 04/30/21 ?0146 04/30/21 ?1450 05/01/21 ?0138 05/02/21 ?0146  05/03/21 ?0259 05/04/21 ?0130  ?WBC 10.7*   < > 7.4 7.3 7.5 7.2  ?NEUTROABS 8.2*  --  5.1 4.6 4.5  --   ?HGB 8.3*   < > 7.8* 8.9* 8.5* 8.5*  ?HCT 26.9*   < > 25.5* 29.3* 28.6* 26.2*  ?MCV 90.6   < > 90.7 91.3 91.7 89.7  ?PLT 245   < > 187 234 219 218  ? < > = values in this interval not displayed.  ? ? ? ?  ?Medications:   ? ? amLODipine  10 mg Oral Daily  ? apixaban  5 mg Oral BID  ? atorvastatin  40 mg Oral QPM  ? calcium acetate  1,334 mg Oral TID WC  ? Chlorhexidine Gluconate Cloth  6 each Topical Q0600  ? dextrose  50 mL Intravenous Once  ? heparin sodium (porcine)      ? heparin sodium (porcine)  3,800 Units Intracatheter Once in dialysis  ? insulin aspart  0-6 Units Subcutaneous TID WC  ? metoprolol succinate  100 mg Oral QPM  ? pantoprazole  40 mg Oral BID  ? venlafaxine XR  150 mg Oral Daily  ? ? ? ?

## 2021-05-04 NOTE — Discharge Instructions (Signed)
Follow with Primary MD Kelli [name], Kelli Moment, MD in 7 days  ? ?Get CBC, CMP, 2 view Chest X ray -  checked next visit within 1 week by Primary MD  ? ?Activity: As tolerated with Full fall precautions use walker/cane & assistance as needed ? ?Disposition Home  ? ?Diet: Low Carb diet, 1.5 lit/day fluid restriction. ? ?Check your Weight same time everyday, if you gain over 2 pounds, or you develop in leg swelling, experience more shortness of breath or chest pain, call your Primary MD immediately. Follow Cardiac Low Salt Diet and 1.5 lit/day fluid restriction. ? ?Accuchecks 4 times/day, Once in AM empty stomach and then before each meal. ?Log in all results and show them to your Prim.MD in 3 days. ?If any glucose reading is under 80 or above 300 call your Prim MD immidiately. ?Follow Low glucose instructions for glucose under 80 as instructed. ? ? ?Special Instructions: If you have smoked or chewed Tobacco  in the last 2 yrs please stop smoking, stop any regular Alcohol  and or any Recreational drug use. ? ?On your next visit with your primary care physician please Get Medicines reviewed and adjusted. ? ?Please request your Prim.MD to go over all Hospital Tests and Procedure/Radiological results at the follow up, please get all Hospital records sent to your Prim MD by signing hospital release before you go home. ? ?If you experience worsening of your admission symptoms, develop shortness of breath, life threatening emergency, suicidal or homicidal thoughts you must seek medical attention immediately by calling 911 or calling your MD immediately  if symptoms less severe. ? ?You Must read complete instructions/literature along with all the possible adverse reactions/side effects for all the Medicines you take and that have been prescribed to you. Take any new Medicines after you have completely understood and accpet all the possible adverse reactions/side effects.  ? ?  ?

## 2021-05-04 NOTE — Discharge Summary (Signed)
Kelli [name] DSK:[id number] DOB: 12/16/1950 DOA: 04/28/2021  PCP: Manon Hilding, MD  Admit date: 04/28/2021  Discharge date: 05/04/2021  Admitted From: Home   Disposition:  Home   Recommendations for Outpatient Follow-up:   Follow up with PCP in 1-2 weeks  PCP Please obtain BMP/CBC, 2 view CXR in 1week,  (see Discharge instructions)   PCP Please follow up on the following pending results: monitor CBC, home Med compliance closely   Home Health: PT, RN if she qualifies   Equipment/Devices: as below  Consultations: Renal, GI Discharge Condition: Stable    CODE STATUS: Full    Diet Recommendation: Low carbohydrate diet with 1.5 L fluid restriction per day.    Chief Complaint  Patient presents with   Shortness of Breath     Brief history of present illness from the day of admission and additional interim summary    71 year old female with history of ESRD on TTS schedule who lives in [place], [place] type II, chronic diastolic CHF, paroxysmal atrial fibrillation [place] vas 2 score of greater than 3 on Eliquis, essential hypertension, dyslipidemia and anxiety.  Patient presented to [facility], ER yesterday after she had issues with her HD dialysis catheter and she could only have 500 cc of fluid removed on 04/28/2021.  At the time of presentation to [facility], ER she was noted to have a hemoglobin of 7, mild leukocytosis, she was admitted for HD catheter exchange.  From [facility] she was transferred to [facility] for HD catheter exchange on 04/29/2021 she was noted to have a hemoglobin of 5.9 this morning at [facility] and a unit of blood transfusion was started, upon arrival to [facility], IR department she was noted to have 1 unit of packed RBC transfusion running, she also had bilateral crackles and had difficulty  laying flat.  I was called to evaluate the patient in the IR department for further care.                                                                 Hospital Course   Acute hypoxic respiratory failure due to acute on chronic diastolic CHF from missed dialysis treatment, HD catheter malfunction at [facility], worsened by acute on chronic anemia.   She had her HD catheter is exchanged 04/29/21.  Underwent under urgent HD on 04/29/2021, shortness of breath much improved, renal following, on TTS schedule. Resolved.  She is now on room air and close to baseline and symptom-free.   ESRD.  On TTS schedule.   TTS schedule renal following, is getting a short run of dialysis today prior to discharge.   Acute anemia in the presence of anemia of chronic disease in a patient who is on Eliquis.  No history of melena or blood  in stool, she was on Eliquis, no signs of brisk ongoing GI bleed, s/p 1 unit of packed RBC transfusion on 04/29/2021 with stable posttransfusion H&H, continue PPI.  Her Eliquis was held she was seen by GI underwent EGD showing gastritis and Erythematous duodenopathy.  As discussed with Dr. Benson Norway on 05/02/2021.  Okay to resume Eliquis.  Stable H&H today after resumption of Eliquis on 05/03/2021.  We will discharge her home today, post discharge follow-up with her gastroenterologist or Dr. Benson Norway in 1 to 2 weeks.   History of stroke with dense left upper extremity hemiparesis and mild lower extremity weakness.  Continue supportive care.  Eliquis as above, continue statin for secondary prevention.   Essential hypertension.  On beta-blocker continue and monitor.   Paroxysmal atrial fibrillation Mal[place] vas 2 score of greater than 5.  On beta-blocker which will be continued Eliquis as above.  ECP to monitor compliance with medications.  Apparently she was not taking Eliquis for several weeks.   Acute on chronic diastolic CHF EF 53% on echocardiogram done in November 2022.  This is due to missed  dialysis treatment kindly see plan in #1 above continue beta-blocker for proper diastolic refilling.   Obesity.  BMI 31.  Follow with PCP for weight loss.   Mild metabolic encephalopathy.  Upon talking to the husband it seems like patient is developing some early dementia or age-related cognitive decline, she has some acute metabolic encephalopathy on top of it due to missed HD treatment, anemia and being in unfamiliar setting.  Encephalopathy improved to baseline.   Dyslipidemia.  On statin   DM type II.  Home Rx, requested to do Accu-Cheks q. [facility] maintain a logbook and show it to PCP next visit.  Lab Results  Component Value Date   HGBA1C 4.9 04/29/2021      Discharge diagnosis     Principal Problem:   Hemodialysis catheter dysfunction ([place]) Active Problems:   Essential hypertension   Obesity   Generalized anxiety disorder   Anemia   Type 2 diabetes mellitus with chronic kidney disease on chronic dialysis, with long-term current use of insulin (HCC)   Acute respiratory failure with hypoxia (HCC)   ESRD on hemodialysis (HCC)   Atrial fibrillation, chronic (HCC)   Acute anemia    Discharge instructions    Discharge Instructions     Diet - low sodium heart healthy   Complete by: As directed    Discharge instructions   Complete by: As directed    Follow with Primary MD Quintin Alto, [name], MD in 7 days   Get CBC, CMP, 2 view Chest X ray -  checked next visit within 1 week by Primary MD   Activity: As tolerated with Full fall precautions use walker/cane & assistance as needed  Disposition Home   Diet: Low Carb diet, 1.5 lit/day fluid restriction.  Check your Weight same time everyday, if you gain over 2 pounds, or you develop in leg swelling, experience more shortness of breath or chest pain, call your Primary MD immediately. Follow Cardiac Low Salt Diet and 1.5 lit/day fluid restriction.  Accuchecks 4 times/day, Once in AM empty stomach and then before each  meal. Log in all results and show them to your Prim.MD in 3 days. If any glucose reading is under 80 or above 300 call your Prim MD immidiately. Follow Low glucose instructions for glucose under 80 as instructed.   Special Instructions: If you have smoked or chewed Tobacco  in the last  2 yrs please stop smoking, stop any regular Alcohol  and or any Recreational drug use.  On your next visit with your primary care physician please Get Medicines reviewed and adjusted.  Please request your Prim.MD to go over all Hospital Tests and Procedure/Radiological results at the follow up, please get all Hospital records sent to your Prim MD by signing hospital release before you go home.  If you experience worsening of your admission symptoms, develop shortness of breath, life threatening emergency, suicidal or homicidal thoughts you must seek medical attention immediately by calling 911 or calling your MD immediately  if symptoms less severe.  You Must read complete instructions/literature along with all the possible adverse reactions/side effects for all the Medicines you take and that have been prescribed to you. Take any new Medicines after you have completely understood and accpet all the possible adverse reactions/side effects.   No wound care   Complete by: As directed    No wound care   Complete by: As directed        Discharge Medications   Allergies as of 05/04/2021       Reactions   Hydrochlorothiazide Nausea And Vomiting        Medication List     STOP taking these medications    albuterol 108 (90 Base) MCG/ACT inhaler Commonly known as: VENTOLIN HFA   benzonatate 100 MG capsule Commonly known as: TESSALON   famotidine 20 MG tablet Commonly known as: PEPCID   fluticasone 50 MCG/ACT nasal spray Commonly known as: FLONASE   hydrALAZINE 50 MG tablet Commonly known as: APRESOLINE   loratadine 10 MG tablet Commonly known as: CLARITIN       TAKE these medications     acetaminophen 325 MG tablet Commonly known as: TYLENOL Take 2 tablets (650 mg total) by mouth every 6 (six) hours as needed for mild pain (or Fever >/= 101).   amLODipine 10 MG tablet Commonly known as: NORVASC Take 1 tablet (10 mg total) by mouth daily.   apixaban 5 MG Tabs tablet Commonly known as: ELIQUIS Take 1 tablet (5 mg total) by mouth 2 (two) times daily.   atorvastatin 40 MG tablet Commonly known as: LIPITOR Take 40 mg by mouth every evening.   calcium acetate 667 MG capsule Commonly known as: PHOSLO Take 2 capsules (1,334 mg total) by mouth 3 (three) times daily with meals.   clonazePAM 0.5 MG tablet Commonly known as: KLONOPIN Take 0.5 mg by mouth daily.   insulin glargine 100 UNIT/ML injection Commonly known as: LANTUS Inject 15 Units into the skin daily.   metoprolol succinate 100 MG 24 hr tablet Commonly known as: TOPROL-XL Take 1 tablet (100 mg total) by mouth daily. What changed: when to take this   ondansetron 8 MG disintegrating tablet Commonly known as: Zofran ODT Take 1 tablet (8 mg total) by mouth every 8 (eight) hours as needed for nausea or vomiting.   pantoprazole 40 MG tablet Commonly known as: PROTONIX Take 1 tablet (40 mg total) by mouth 2 (two) times daily.   venlafaxine XR 150 MG 24 hr capsule Commonly known as: EFFEXOR-XR Take 150 mg by mouth daily.         Follow-up Information     [name], [name], MD. Schedule an appointment as soon as possible for a visit in 1 week(s).   Specialty: Family Medicine Contact information: [place] [address] [phone]         Evans Lance,  MD .   Specialty: Cardiology Contact information: [place] [address] [phone]         Carol Ada, MD. Schedule an appointment as soon as possible for a visit in 2 week(s).   Specialty: Gastroenterology Contact information: [address] [place]  [address] [phone]                  Major procedures and Radiology Reports - PLEASE review detailed and final reports thoroughly  -      IR Venocavagram Svc  Result Date: 04/29/2021 INDICATION: 71 year old female with history of end-stage renal disease and malfunctioning indwelling right internal jugular tunneled hemodialysis catheter. The patient has previously presented for multiple tunneled line exchanges and central venoplasty. EXAM: 1. TUNNELED CENTRAL VENOUS HEMODIALYSIS CATHETER REPLACEMENT WITH FLUOROSCOPIC GUIDANCE 2. Central venogram 3. Intravascular ultrasound 4. Balloon venoplasty of the right innominate vein and superior vena cava. MEDICATIONS: 2 g Ancef, intravenous Moderate (conscious) sedation was employed during this procedure. A total of Versed 0.5 mg and Fentanyl 25 mcg was administered intravenously. Moderate Sedation Time: 42 minutes. The patient's level of consciousness and vital signs were monitored continuously by radiology nursing throughout the procedure under my direct supervision. FLUOROSCOPY TIME:  Seven minutes 42 seconds, 65 mGy CONTRAST:  20 mL Omnipaque 300, intravenous COMPLICATIONS: None immediate. PROCEDURE: Informed written consent was obtained from the patient after a discussion of the risks, benefits, and alternatives to treatment. Questions regarding the procedure were encouraged and answered. The skin and external portion of the existing hemodialysis catheter was prepped with chlorhexidine in a sterile fashion, and a sterile drape was applied covering the operative field. Maximum barrier sterile technique with sterile gowns and gloves were used for the procedure. A timeout was performed prior to the initiation of the procedure. Both lumens of the hemodialysis catheter were cannulated with a stiff Glidewire and advanced to the level of the right atrium. Under intermittent fluoroscopic guidance, the existing dialysis catheter was exchanged for a 12 French, 45 cm vascular sheath with the tip  positioned in the right innominate vein, which was inserted over 1 of the glide wires. Central venogram was performed which demonstrated patency of the superior vena cava with brisk antegrade flow. There is mild tortuosity of the central superior vena cava near the cavoatrial junction. Intravascular ultrasound was then utilized to study the central veins. Within the innominate vein, extending into the superior vena cava is an echogenic fibrin sheath surrounding the indwelling catheter track. Balloon venoplasty was then performed of the right innominate vein and superior vena cava with a 10 mm x 8 cm Athletis balloon. Repeat intravascular ultrasound evaluation demonstrated no evidence of vessel rupture, however did demonstrate disruption of the fibrin sheath. Under intravascular ultrasound guidance, the safety glidewire was retracted to the peripheral aspect of the right innominate vein and redirected outside of the fibrin sheath to the normal lumen of the innominate vein and superior vena cava. The tip of the wire was then positioned in the inferior vena cava. The Rosen wire and sheath were removed. A new, 23 cm tunneled hemodialysis catheter was inserted over the indwelling Glidewire. Unfortunately there was the resistance met and the catheter was unable to be advanced beyond the level of the cavoatrial junction into the right atrium. Therefore, the catheter was removed and the sheath was reinserted. Additional balloon venoplasty was performed about the cavoatrial junction and central SVC with a 14 mm x 4 cm Atlas balloon. The sheath was then again  removed and the 23 cm tunneled hemodialysis catheter was inserted without difficulty. The tip was positioned in the right atrium, at the most cavernous location correlated with intravascular ultrasound imaging. The catheter aspirates and flushes normally. The catheter was flushed with appropriate volume heparin dwells. The catheter exit site was secured with a 0-Silk  retention sutures. Both lumens were heparinized. A dressing was placed. The patient tolerated the procedure well without immediate post procedural complication. IMPRESSION: 1. 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 Tortuosity and mild stenosis of the cavoatrial junction. 2. Intravascular ultrasound guided fibrin sheath disruption and repositioning of hemodialysis access, external to the fibrin sheath. 3. Technically successful replacement of 23 cm tunneled hemodialysis catheter with the tip in the right atrium. PLAN: The catheter is ready for immediate use. Ruthann Cancer, MD [facility] Radiology Electronically Signed   By: Ruthann Cancer M.D.   On: 04/29/2021 16:05   IR Fluoro Guide CV Line Right  Result Date: 04/29/2021 INDICATION: 71 year old female with history of end-stage renal disease and malfunctioning indwelling right internal jugular tunneled hemodialysis catheter. The patient has previously presented for multiple tunneled line exchanges and central venoplasty. EXAM: 1. TUNNELED CENTRAL VENOUS HEMODIALYSIS CATHETER REPLACEMENT WITH FLUOROSCOPIC GUIDANCE 2. Central venogram 3. Intravascular ultrasound 4. Balloon venoplasty of the right innominate vein and superior vena cava. MEDICATIONS: 2 g Ancef, intravenous Moderate (conscious) sedation was employed during this procedure. A total of Versed 0.5 mg and Fentanyl 25 mcg was administered intravenously. Moderate Sedation Time: 42 minutes. The patient's level of consciousness and vital signs were monitored continuously by radiology nursing throughout the procedure under my direct supervision. FLUOROSCOPY TIME:  Seven minutes 42 seconds, 65 mGy CONTRAST:  20 mL Omnipaque 300, intravenous COMPLICATIONS: None immediate. PROCEDURE: Informed written consent was obtained from the patient after a discussion of the risks, benefits, and alternatives to treatment. Questions regarding the  procedure were encouraged and answered. The skin and external portion of the existing hemodialysis catheter was prepped with chlorhexidine in a sterile fashion, and a sterile drape was applied covering the operative field. Maximum barrier sterile technique with sterile gowns and gloves were used for the procedure. A timeout was performed prior to the initiation of the procedure. Both lumens of the hemodialysis catheter were cannulated with a stiff Glidewire and advanced to the level of the right atrium. Under intermittent fluoroscopic guidance, the existing dialysis catheter was exchanged for a 12 French, 45 cm vascular sheath with the tip positioned in the right innominate vein, which was inserted over 1 of the glide wires. Central venogram was performed which demonstrated patency of the superior vena cava with brisk antegrade flow. There is mild tortuosity of the central superior vena cava near the cavoatrial junction. Intravascular ultrasound was then utilized to study the central veins. Within the innominate vein, extending into the superior vena cava is an echogenic fibrin sheath surrounding the indwelling catheter track. Balloon venoplasty was then performed of the right innominate vein and superior vena cava with a 10 mm x 8 cm Athletis balloon. Repeat intravascular ultrasound evaluation demonstrated no evidence of vessel rupture, however did demonstrate disruption of the fibrin sheath. Under intravascular ultrasound guidance, the safety glidewire was retracted to the peripheral aspect of the right innominate vein and redirected outside of the fibrin sheath to the normal lumen of the innominate vein and superior vena cava. The tip of the wire was then positioned in the inferior vena cava. The Rosen wire and sheath were removed.  A new, 23 cm tunneled hemodialysis catheter was inserted over the indwelling Glidewire. Unfortunately there was the resistance met and the catheter was unable to be advanced beyond the  level of the cavoatrial junction into the right atrium. Therefore, the catheter was removed and the sheath was reinserted. Additional balloon venoplasty was performed about the cavoatrial junction and central SVC with a 14 mm x 4 cm Atlas balloon. The sheath was then again removed and the 23 cm tunneled hemodialysis catheter was inserted without difficulty. The tip was positioned in the right atrium, at the most cavernous location correlated with intravascular ultrasound imaging. The catheter aspirates and flushes normally. The catheter was flushed with appropriate volume heparin dwells. The catheter exit site was secured with a 0-Silk retention sutures. Both lumens were heparinized. A dressing was placed. The patient tolerated the procedure well without immediate post procedural complication. IMPRESSION: 1. 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 Tortuosity and mild stenosis of the cavoatrial junction. 2. Intravascular ultrasound guided fibrin sheath disruption and repositioning of hemodialysis access, external to the fibrin sheath. 3. Technically successful replacement of 23 cm tunneled hemodialysis catheter with the tip in the right atrium. PLAN: The catheter is ready for immediate use. Ruthann Cancer, MD [facility] Radiology Electronically Signed   By: Ruthann Cancer M.D.   On: 04/29/2021 16:05   DG Chest Port 1 View  Result Date: 05/01/2021 CLINICAL DATA:  Shortness of breath and cough. EXAM: PORTABLE CHEST 1 VIEW COMPARISON:  04/29/2021 FINDINGS: 0632 hours. The cardio pericardial silhouette is enlarged. Right IJ central line tip overlies the right atrium. There is persistent retrocardiac left base collapse/consolidative opacity with more prominent airspace disease in the left mid lung on today's study. Probable small left effusion. Right lung clear. Telemetry leads overlie the chest. IMPRESSION: Interval development of airspace  disease in the left mid lung. Persistent retrocardiac left base collapse/consolidative opacity with probable small left effusion. Electronically Signed   By: Misty Stanley M.D.   On: 05/01/2021 07:47   DG Chest Port 1 View  Result Date: 04/29/2021 CLINICAL DATA:  Shortness of breath, confusion, missed dialysis for 1 week, history hypertension, diabetes mellitus, stroke, end-stage renal disease EXAM: PORTABLE CHEST 1 VIEW COMPARISON:  Portable exam 1537 hours compared to 04/28/2021 FINDINGS: RIGHT jugular line with tip projecting over RIGHT atrium. Enlargement of cardiac silhouette with pulmonary vascular congestion. Loop recorder versus leadless pacemaker projecting over LEFT heart. Atherosclerotic calcification aorta. RIGHT basilar atelectasis. Atelectasis versus consolidation LEFT lower lobe. No definite pleural effusion or pneumothorax. Vascular stents in the upper arms bilaterally. IMPRESSION: Atelectasis versus consolidation at LEFT lung base with mild RIGHT basilar atelectasis. Enlargement of cardiac silhouette with pulmonary vascular congestion. Aortic Atherosclerosis (ICD10-I70.0). Electronically Signed   By: Lavonia Dana M.D.   On: 04/29/2021 15:53   DG Chest Port 1 View  Result Date: 04/28/2021 CLINICAL DATA:  Shortness of breath EXAM: PORTABLE CHEST 1 VIEW COMPARISON:  Chest x-ray 01/14/2021 FINDINGS: Cardiomegaly and mediastinum appear unchanged. Calcified plaques in the thoracic aorta. Right-sided central line with the tip in the right atrium. Left-sided atrial loop recorder. Pulmonary vascular prominence. No focal consolidation identified. No definite pleural effusion visualized. No pneumothorax. IMPRESSION: Cardiomegaly with pulmonary vascular congestion. Electronically Signed   By: Ofilia Neas M.D.   On: 04/28/2021 12:58   IR PTA VENOUS EXCEPT DIALYSIS CIRCUIT  Result Date: 04/29/2021 INDICATION: 71 year old female with history of end-stage renal disease and malfunctioning indwelling  right internal jugular tunneled  hemodialysis catheter. The patient has previously presented for multiple tunneled line exchanges and central venoplasty. EXAM: 1. TUNNELED CENTRAL VENOUS HEMODIALYSIS CATHETER REPLACEMENT WITH FLUOROSCOPIC GUIDANCE 2. Central venogram 3. Intravascular ultrasound 4. Balloon venoplasty of the right innominate vein and superior vena cava. MEDICATIONS: 2 g Ancef, intravenous Moderate (conscious) sedation was employed during this procedure. A total of Versed 0.5 mg and Fentanyl 25 mcg was administered intravenously. Moderate Sedation Time: 42 minutes. The patient's level of consciousness and vital signs were monitored continuously by radiology nursing throughout the procedure under my direct supervision. FLUOROSCOPY TIME:  Seven minutes 42 seconds, 65 mGy CONTRAST:  20 mL Omnipaque 300, intravenous COMPLICATIONS: None immediate. PROCEDURE: Informed written consent was obtained from the patient after a discussion of the risks, benefits, and alternatives to treatment. Questions regarding the procedure were encouraged and answered. The skin and external portion of the existing hemodialysis catheter was prepped with chlorhexidine in a sterile fashion, and a sterile drape was applied covering the operative field. Maximum barrier sterile technique with sterile gowns and gloves were used for the procedure. A timeout was performed prior to the initiation of the procedure. Both lumens of the hemodialysis catheter were cannulated with a stiff Glidewire and advanced to the level of the right atrium. Under intermittent fluoroscopic guidance, the existing dialysis catheter was exchanged for a 12 French, 45 cm vascular sheath with the tip positioned in the right innominate vein, which was inserted over 1 of the glide wires. Central venogram was performed which demonstrated patency of the superior vena cava with brisk antegrade flow. There is mild tortuosity of the central superior vena cava near the  cavoatrial junction. Intravascular ultrasound was then utilized to study the central veins. Within the innominate vein, extending into the superior vena cava is an echogenic fibrin sheath surrounding the indwelling catheter track. Balloon venoplasty was then performed of the right innominate vein and superior vena cava with a 10 mm x 8 cm Athletis balloon. Repeat intravascular ultrasound evaluation demonstrated no evidence of vessel rupture, however did demonstrate disruption of the fibrin sheath. Under intravascular ultrasound guidance, the safety glidewire was retracted to the peripheral aspect of the right innominate vein and redirected outside of the fibrin sheath to the normal lumen of the innominate vein and superior vena cava. The tip of the wire was then positioned in the inferior vena cava. The Rosen wire and sheath were removed. A new, 23 cm tunneled hemodialysis catheter was inserted over the indwelling Glidewire. Unfortunately there was the resistance met and the catheter was unable to be advanced beyond the level of the cavoatrial junction into the right atrium. Therefore, the catheter was removed and the sheath was reinserted. Additional balloon venoplasty was performed about the cavoatrial junction and central SVC with a 14 mm x 4 cm Atlas balloon. The sheath was then again removed and the 23 cm tunneled hemodialysis catheter was inserted without difficulty. The tip was positioned in the right atrium, at the most cavernous location correlated with intravascular ultrasound imaging. The catheter aspirates and flushes normally. The catheter was flushed with appropriate volume heparin dwells. The catheter exit site was secured with a 0-Silk retention sutures. Both lumens were heparinized. A dressing was placed. The patient tolerated the procedure well without immediate post procedural complication. IMPRESSION: 1. 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 Tortuosity and mild  stenosis of the cavoatrial junction. 2. Intravascular ultrasound guided fibrin sheath disruption and repositioning of hemodialysis access, external to the fibrin sheath. 3. Technically successful  replacement of 23 cm tunneled hemodialysis catheter with the tip in the right atrium. PLAN: The catheter is ready for immediate use. Ruthann Cancer, MD [facility] Radiology Electronically Signed   By: Ruthann Cancer M.D.   On: 04/29/2021 16:05   IR INTRAVASCULAR ULTRASOUND NON CORONARY  Result Date: 04/29/2021 INDICATION: 71 year old female with history of end-stage renal disease and malfunctioning indwelling right internal jugular tunneled hemodialysis catheter. The patient has previously presented for multiple tunneled line exchanges and central venoplasty. EXAM: 1. TUNNELED CENTRAL VENOUS HEMODIALYSIS CATHETER REPLACEMENT WITH FLUOROSCOPIC GUIDANCE 2. Central venogram 3. Intravascular ultrasound 4. Balloon venoplasty of the right innominate vein and superior vena cava. MEDICATIONS: 2 g Ancef, intravenous Moderate (conscious) sedation was employed during this procedure. A total of Versed 0.5 mg and Fentanyl 25 mcg was administered intravenously. Moderate Sedation Time: 42 minutes. The patient's level of consciousness and vital signs were monitored continuously by radiology nursing throughout the procedure under my direct supervision. FLUOROSCOPY TIME:  Seven minutes 42 seconds, 65 mGy CONTRAST:  20 mL Omnipaque 300, intravenous COMPLICATIONS: None immediate. PROCEDURE: Informed written consent was obtained from the patient after a discussion of the risks, benefits, and alternatives to treatment. Questions regarding the procedure were encouraged and answered. The skin and external portion of the existing hemodialysis catheter was prepped with chlorhexidine in a sterile fashion, and a sterile drape was applied covering the operative field. Maximum barrier sterile technique  with sterile gowns and gloves were used for the procedure. A timeout was performed prior to the initiation of the procedure. Both lumens of the hemodialysis catheter were cannulated with a stiff Glidewire and advanced to the level of the right atrium. Under intermittent fluoroscopic guidance, the existing dialysis catheter was exchanged for a 12 French, 45 cm vascular sheath with the tip positioned in the right innominate vein, which was inserted over 1 of the glide wires. Central venogram was performed which demonstrated patency of the superior vena cava with brisk antegrade flow. There is mild tortuosity of the central superior vena cava near the cavoatrial junction. Intravascular ultrasound was then utilized to study the central veins. Within the innominate vein, extending into the superior vena cava is an echogenic fibrin sheath surrounding the indwelling catheter track. Balloon venoplasty was then performed of the right innominate vein and superior vena cava with a 10 mm x 8 cm Athletis balloon. Repeat intravascular ultrasound evaluation demonstrated no evidence of vessel rupture, however did demonstrate disruption of the fibrin sheath. Under intravascular ultrasound guidance, the safety glidewire was retracted to the peripheral aspect of the right innominate vein and redirected outside of the fibrin sheath to the normal lumen of the innominate vein and superior vena cava. The tip of the wire was then positioned in the inferior vena cava. The Rosen wire and sheath were removed. A new, 23 cm tunneled hemodialysis catheter was inserted over the indwelling Glidewire. Unfortunately there was the resistance met and the catheter was unable to be advanced beyond the level of the cavoatrial junction into the right atrium. Therefore, the catheter was removed and the sheath was reinserted. Additional balloon venoplasty was performed about the cavoatrial junction and central SVC with a 14 mm x 4 cm Atlas balloon. The  sheath was then again removed and the 23 cm tunneled hemodialysis catheter was inserted without difficulty. The tip was positioned in the right atrium, at the most cavernous location correlated with intravascular ultrasound imaging. The catheter aspirates and flushes normally. The catheter was flushed  with appropriate volume heparin dwells. The catheter exit site was secured with a 0-Silk retention sutures. Both lumens were heparinized. A dressing was placed. The patient tolerated the procedure well without immediate post procedural complication. IMPRESSION: 1. 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 Tortuosity and mild stenosis of the cavoatrial junction. 2. Intravascular ultrasound guided fibrin sheath disruption and repositioning of hemodialysis access, external to the fibrin sheath. 3. Technically successful replacement of 23 cm tunneled hemodialysis catheter with the tip in the right atrium. PLAN: The catheter is ready for immediate use. Ruthann Cancer, MD [facility] Radiology Electronically Signed   By: Ruthann Cancer M.D.   On: 04/29/2021 16:05     Today   Subjective    Hydie Humphrey today has no headache,no chest abdominal pain,no new weakness tingling or numbness, feels much better wants to go home today.      Objective   Blood pressure 140/63, pulse 90, temperature 98 F (36.7 C), resp. rate 19, height _0  (1.651 m), weight 68.1 kg, SpO2 94 %.   Intake/Output Summary (Last 24 hours) at 05/04/2021 0814 Last data filed at 05/03/2021 1300 Gross per 24 hour  Intake 0 ml  Output --  Net 0 ml    Exam  Awake Alert, No new F.N deficits, chronic left-sided hemiparesis, right IJ dialysis catheter in place [place].AT,PERRAL Supple Neck,   Symmetrical Chest wall movement, Good air movement bilaterally, CTAB RRR,No Gallops,   +ve B.Sounds, Abd Soft, Non tender,  No Cyanosis, Clubbing or edema    Data Review   CBC w  Diff:  Lab Results  Component Value Date   WBC 7.2 05/04/2021   HGB 8.5 (L) 05/04/2021   HCT 26.2 (L) 05/04/2021   PLT 218 05/04/2021   LYMPHOPCT 24 05/03/2021   MONOPCT 13 05/03/2021   EOSPCT 2 05/03/2021   BASOPCT 1 05/03/2021    CMP:  Lab Results  Component Value Date   NA 139 05/03/2021   K 3.5 05/03/2021   CL 102 05/03/2021   CO2 26 05/03/2021   BUN 14 05/03/2021   CREATININE 3.34 (H) 05/03/2021   PROT 5.9 (L) 05/03/2021   ALBUMIN 2.5 (L) 05/03/2021   BILITOT 0.5 05/03/2021   ALKPHOS 69 05/03/2021   AST 10 (L) 05/03/2021   ALT 7 05/03/2021  .   Total Time in preparing paper work, data evaluation and todays exam - 44 minutes  Lala Lund M.D on 05/04/2021 at 8:14 AM  [facility]

## 2021-05-04 NOTE — Progress Notes (Signed)
removed 519msnet fluid unable to remove more due to hypotension andcomplaintsof shortnessofbreath and high rate afib. prebp151/73 postbp 141/65 pre weight 68.1kg postweight67.5kg . gave tylenol forpain andpacked with catheter with heparin clampedand capped. ? ?Within 1 hourof [place] start of treatment bp dropped to100/46 pt stated she couldn't breath,  sit patient straight up in bed.  Placed on oxygen 2 liters, nasal cannula and called respiratory,  respiatory arrived said no intervention needed to call them back if pt continued to complain. Pt 02 saturation was above 93 on room air 100percent with 2 liters of oxygen.  ?

## 2021-05-04 NOTE — TOC Transition Note (Signed)
Transition of Care (TOC) - CM/SW Discharge Note ?Marvetta Gibbons Therapist, sports, BSN ?Transitions of Care ?Unit 4E- RN Case Manager ?See Treatment Team for direct phone #  ? ? ?Patient Details  ?Name: Kelli [name] ?MRN: [id number] ?Date of Birth: 1951-02-01 ? ?Transition of Care (TOC) CM/SW Contact:  ?Dahlia Client, Romeo Rabon, RN ?Phone Number: ?05/04/2021, 12:41 PM ? ? ?Clinical Narrative:    ?Pt stable for transition home today w/ spouse. Orders placed for HHRN/PT, CM in to speak with pt and spouse. List provided for [facility] choice Per CMS guidelines from medicare.gov website with star ratings (copy placed in shadow chart) . In conversation w/ spouse he does not feel pt will participate with HHPT as they have tried in the past, he also does not feel like HHRN would offer much benefit at this time. Spouse and patient declining HH services at this time. Advised that should they change their minds at a later date they can contact patient's PCP and get orders/referral. Spouse verbalized understanding.  ? ?Also discussed Eliquis, per spouse they were started on Eliquis at the end of last year and just ran out. Were unable to fill as cost was $600+, spouse believes that this may be due to not meeting deductible yet for this yet but has not spoken to pharmacy or insurance yet. Will try to call insurance and do benefits check to assist. Per spouse they did not use 30 day card w/ drug company- card provided to use on discharge. Spouse will also check with doctor's office to see if they can assist with any samples until deductible is met.  ? ?Attempted benefits check- however insurance would not speak with TOC member and would not provide any info without speaking to patient. Spouse will have to call and follow up on Eliquis cost and find out about deductible.  ? ? ? ? ?Final next level of care: Home/Self Care ?Barriers to Discharge: Barriers Resolved ? ? ?Patient Goals and CMS Choice ?Patient states their goals for this hospitalization and ongoing  recovery are:: return home ?CMS Medicare.gov Compare Post Acute Care list provided to:: Patient Represenative (must comment) (spouse) ?Choice offered to / list presented to : Spouse ? ?Discharge Placement ?  ?           ? Home ?  ?  ?  ? ?Discharge Plan and Services ?  ?Discharge Planning Services: CM Consult ?Post Acute Care Choice: Home Health          ?DME Arranged: N/A ?DME Agency: NA ?  ?  ?  ?HH Arranged: PT, RN, Patient Refused HH ?[place] Agency: NA ?  ?  ?  ? ?Social Determinants of Health (SDOH) Interventions ?  ? ? ?Readmission Risk Interventions ?Readmission Risk Prevention Plan 05/04/2021 01/16/2021  ?Transportation Screening Complete Complete  ?PCP or Specialist Appt within 3-5 Days Complete -  ?[place] or Home Care Consult Complete Complete  ?Social Work Consult for [place] Planning/Counseling Complete Complete  ?Palliative Care Screening Not Applicable Not Applicable  ?Medication Review Press photographer) Complete Complete  ?Some recent data might be hidden  ? ? ? ? ? ?

## 2021-05-04 NOTE — Progress Notes (Signed)
Pt receives out-pt HD at [facility] on TTS. D/C order noted. Contacted [facility] and spoke to [place], Therapist, sports. Clinic aware pt for d/c today and will resume care tomorrow. D/C summary faxed to clinic for continuation of care.  ? ?Melven Sartorius ?Renal Navigator ?[phone] ?

## 2021-05-04 NOTE — Progress Notes (Signed)
Mobility Specialist Progress Note ? ? 05/04/21 1715  ?Mobility  ?Activity Transferred from bed to chair  ?Level of Assistance +2 (takes two people)  ?Assistive Device Wheelchair  ?Distance Ambulated (ft) 2 ft  ?Activity Response Tolerated well  ?$Mobility charge 1 Mobility  ? ?Pt requiring +2 assist to SPT to transport chair. No complaints during tx. ? ?Holland Falling ?Mobility Specialist ?Phone Number [phone] ? ?

## 2021-05-04 NOTE — Progress Notes (Signed)
D/c tele and Ivs. Went over AVS with pt's husband (curtis Lubrano) and all questions were addressed.  ? ?Lavenia Atlas, RN ? ?

## 2021-05-05 DIAGNOSIS — D509 Iron deficiency anemia, unspecified: Secondary | ICD-10-CM | POA: Diagnosis not present

## 2021-05-05 DIAGNOSIS — N250 Renal osteodystrophy: Secondary | ICD-10-CM | POA: Diagnosis not present

## 2021-05-05 DIAGNOSIS — Z992 Dependence on renal dialysis: Secondary | ICD-10-CM | POA: Diagnosis not present

## 2021-05-05 DIAGNOSIS — N186 End stage renal disease: Secondary | ICD-10-CM | POA: Diagnosis not present

## 2021-05-05 DIAGNOSIS — D631 Anemia in chronic kidney disease: Secondary | ICD-10-CM | POA: Diagnosis not present

## 2021-05-05 LAB — SURGICAL PATHOLOGY

## 2021-05-05 NOTE — Progress Notes (Signed)
Late Entry Note.  ? ?Contacted by renal NP with request that d/c summary and last renal note be faxed to [facility]. D/C summary and last renal note faxed to clinic per request.  ? ?Melven Sartorius ?Renal Navigator ?[phone] ?

## 2021-05-09 DIAGNOSIS — N250 Renal osteodystrophy: Secondary | ICD-10-CM | POA: Diagnosis not present

## 2021-05-09 DIAGNOSIS — Z992 Dependence on renal dialysis: Secondary | ICD-10-CM | POA: Diagnosis not present

## 2021-05-09 DIAGNOSIS — N186 End stage renal disease: Secondary | ICD-10-CM | POA: Diagnosis not present

## 2021-05-09 DIAGNOSIS — D631 Anemia in chronic kidney disease: Secondary | ICD-10-CM | POA: Diagnosis not present

## 2021-05-09 DIAGNOSIS — D509 Iron deficiency anemia, unspecified: Secondary | ICD-10-CM | POA: Diagnosis not present

## 2021-05-12 DIAGNOSIS — D631 Anemia in chronic kidney disease: Secondary | ICD-10-CM | POA: Diagnosis not present

## 2021-05-12 DIAGNOSIS — N186 End stage renal disease: Secondary | ICD-10-CM | POA: Diagnosis not present

## 2021-05-12 DIAGNOSIS — Z992 Dependence on renal dialysis: Secondary | ICD-10-CM | POA: Diagnosis not present

## 2021-05-12 DIAGNOSIS — D509 Iron deficiency anemia, unspecified: Secondary | ICD-10-CM | POA: Diagnosis not present

## 2021-05-12 DIAGNOSIS — N250 Renal osteodystrophy: Secondary | ICD-10-CM | POA: Diagnosis not present

## 2021-05-13 ENCOUNTER — Telehealth (HOSPITAL_COMMUNITY): Payer: Self-pay

## 2021-05-13 ENCOUNTER — Other Ambulatory Visit (HOSPITAL_COMMUNITY): Payer: Self-pay

## 2021-05-13 DIAGNOSIS — R233 Spontaneous ecchymoses: Secondary | ICD-10-CM | POA: Diagnosis not present

## 2021-05-13 DIAGNOSIS — I4891 Unspecified atrial fibrillation: Secondary | ICD-10-CM | POA: Diagnosis not present

## 2021-05-13 DIAGNOSIS — E1122 Type 2 diabetes mellitus with diabetic chronic kidney disease: Secondary | ICD-10-CM | POA: Diagnosis not present

## 2021-05-13 DIAGNOSIS — N186 End stage renal disease: Secondary | ICD-10-CM | POA: Diagnosis not present

## 2021-05-13 DIAGNOSIS — I5030 Unspecified diastolic (congestive) heart failure: Secondary | ICD-10-CM | POA: Diagnosis not present

## 2021-05-13 DIAGNOSIS — R9389 Abnormal findings on diagnostic imaging of other specified body structures: Secondary | ICD-10-CM | POA: Diagnosis not present

## 2021-05-13 DIAGNOSIS — I10 Essential (primary) hypertension: Secondary | ICD-10-CM | POA: Diagnosis not present

## 2021-05-13 DIAGNOSIS — I130 Hypertensive heart and chronic kidney disease with heart failure and stage 1 through stage 4 chronic kidney disease, or unspecified chronic kidney disease: Secondary | ICD-10-CM | POA: Diagnosis not present

## 2021-05-13 DIAGNOSIS — I6930 Unspecified sequelae of cerebral infarction: Secondary | ICD-10-CM | POA: Diagnosis not present

## 2021-05-13 NOTE — Telephone Encounter (Signed)
Transitions of Care Pharmacy  ? ?Call attempted for a pharmacy transitions of care follow-up.  ? ?Call attempt #1. Will follow-up in 2-3 days.  ?  ?

## 2021-05-14 DIAGNOSIS — N250 Renal osteodystrophy: Secondary | ICD-10-CM | POA: Diagnosis not present

## 2021-05-14 DIAGNOSIS — D509 Iron deficiency anemia, unspecified: Secondary | ICD-10-CM | POA: Diagnosis not present

## 2021-05-14 DIAGNOSIS — Z992 Dependence on renal dialysis: Secondary | ICD-10-CM | POA: Diagnosis not present

## 2021-05-14 DIAGNOSIS — N186 End stage renal disease: Secondary | ICD-10-CM | POA: Diagnosis not present

## 2021-05-14 DIAGNOSIS — D631 Anemia in chronic kidney disease: Secondary | ICD-10-CM | POA: Diagnosis not present

## 2021-05-15 ENCOUNTER — Telehealth (HOSPITAL_COMMUNITY): Payer: Self-pay

## 2021-05-15 NOTE — Telephone Encounter (Signed)
Transitions of Care Pharmacy   Call attempted for a pharmacy transitions of care follow-up. Unable to leave voicemail.   Call attempt #2. Will follow-up in 2-3 days.    

## 2021-05-16 DIAGNOSIS — D631 Anemia in chronic kidney disease: Secondary | ICD-10-CM | POA: Diagnosis not present

## 2021-05-16 DIAGNOSIS — N186 End stage renal disease: Secondary | ICD-10-CM | POA: Diagnosis not present

## 2021-05-16 DIAGNOSIS — Z992 Dependence on renal dialysis: Secondary | ICD-10-CM | POA: Diagnosis not present

## 2021-05-16 DIAGNOSIS — D509 Iron deficiency anemia, unspecified: Secondary | ICD-10-CM | POA: Diagnosis not present

## 2021-05-16 DIAGNOSIS — N250 Renal osteodystrophy: Secondary | ICD-10-CM | POA: Diagnosis not present

## 2021-05-18 ENCOUNTER — Telehealth (HOSPITAL_BASED_OUTPATIENT_CLINIC_OR_DEPARTMENT_OTHER): Payer: Self-pay

## 2021-05-18 ENCOUNTER — Telehealth (HOSPITAL_COMMUNITY): Payer: Self-pay

## 2021-05-18 ENCOUNTER — Other Ambulatory Visit (HOSPITAL_COMMUNITY): Payer: Self-pay

## 2021-05-18 ENCOUNTER — Other Ambulatory Visit (HOSPITAL_BASED_OUTPATIENT_CLINIC_OR_DEPARTMENT_OTHER): Payer: Self-pay

## 2021-05-18 NOTE — Telephone Encounter (Signed)
Pharmacy Transitions of Care Follow-up Telephone Call ? ?Date of discharge: 05/04/21  ?Discharge Diagnosis: Acute PE ? ?How have you been since you were released from the hospital? Spoke with patient's spouse, patient has been well since discharge but is still feeling weak. No questions about meds at this time.  ? ?Medication changes made at discharge: ?    START taking: ?amLODipine (NORVASC)  ?pantoprazole (PROTONIX)  ?STOP taking: ?albuterol 108 (90 Base) MCG/ACT inhaler (VENTOLIN HFA)  ?benzonatate 100 MG capsule (TESSALON)  ?famotidine 20 MG tablet (PEPCID)  ?fluticasone 50 MCG/ACT nasal spray (FLONASE)  ?hydrALAZINE 50 MG tablet (APRESOLINE)  ?loratadine 10 MG tablet (CLARITIN)  ? ?Medication changes verified by the patient? Yes ?  ? ?Medication Accessibility: ? ?Home Pharmacy: Not discussed  ? ?Was the patient provided with refills on discharged medications? No  ? ?Have all prescriptions been transferred from [facility] to home pharmacy? N/A  ? ?Is the patient able to afford medications? Has insurance ?  ? ?Medication Review: ? ?[name] spouse confirmed patient's PCP went over [facility] ?APIXABAN (ELIQUIS)  ?Apixaban 5 mg BID initiated on 05/04/21.  ?- Discussed importance of taking medication around the same time everyday  ?- Reviewed potential DDIs with patient  ?- Advised patient of medications to avoid (NSAIDs, ASA)  ?- Educated that Tylenol (acetaminophen) will be the preferred analgesic to prevent risk of bleeding  ?- Emphasized importance of monitoring for signs and symptoms of bleeding (abnormal bruising, prolonged bleeding, nose bleeds, bleeding from gums, discolored urine, black tarry stools)  ?- Advised patient to alert all providers of anticoagulation therapy prior to starting a new medication or having a procedure  ? ? ?Follow-up Appointments: ? ?PCP Hospital f/u appt confirmed? Patient has already seen PCP ? ?If their condition worsens, is the pt aware to call PCP or go to the Emergency Dept.? Yes ? ?Final  Patient Assessment: ?Patient has seen PCP and has refills at home pharmacy ? ?

## 2021-05-18 NOTE — Telephone Encounter (Signed)
Transitions of Care Pharmacy   Call attempted for a pharmacy transitions of care follow-up. HIPAA appropriate voicemail was left with call back information provided.   Call attempt #2. Will follow-up in 2-3 days.    

## 2021-05-19 DIAGNOSIS — N186 End stage renal disease: Secondary | ICD-10-CM | POA: Diagnosis not present

## 2021-05-19 DIAGNOSIS — N250 Renal osteodystrophy: Secondary | ICD-10-CM | POA: Diagnosis not present

## 2021-05-19 DIAGNOSIS — Z992 Dependence on renal dialysis: Secondary | ICD-10-CM | POA: Diagnosis not present

## 2021-05-19 DIAGNOSIS — D509 Iron deficiency anemia, unspecified: Secondary | ICD-10-CM | POA: Diagnosis not present

## 2021-05-19 DIAGNOSIS — D631 Anemia in chronic kidney disease: Secondary | ICD-10-CM | POA: Diagnosis not present

## 2021-05-21 DIAGNOSIS — D631 Anemia in chronic kidney disease: Secondary | ICD-10-CM | POA: Diagnosis not present

## 2021-05-21 DIAGNOSIS — N186 End stage renal disease: Secondary | ICD-10-CM | POA: Diagnosis not present

## 2021-05-21 DIAGNOSIS — N250 Renal osteodystrophy: Secondary | ICD-10-CM | POA: Diagnosis not present

## 2021-05-21 DIAGNOSIS — Z992 Dependence on renal dialysis: Secondary | ICD-10-CM | POA: Diagnosis not present

## 2021-05-21 DIAGNOSIS — D509 Iron deficiency anemia, unspecified: Secondary | ICD-10-CM | POA: Diagnosis not present

## 2021-05-22 ENCOUNTER — Other Ambulatory Visit: Payer: Self-pay

## 2021-05-22 ENCOUNTER — Encounter: Payer: Self-pay | Admitting: Vascular Surgery

## 2021-05-22 ENCOUNTER — Ambulatory Visit (INDEPENDENT_AMBULATORY_CARE_PROVIDER_SITE_OTHER): Payer: Medicare Other | Admitting: Podiatrist

## 2021-05-22 ENCOUNTER — Ambulatory Visit (INDEPENDENT_AMBULATORY_CARE_PROVIDER_SITE_OTHER): Payer: Medicare Other | Admitting: Vascular Surgery

## 2021-05-22 ENCOUNTER — Encounter: Payer: Self-pay | Admitting: Podiatrist

## 2021-05-22 ENCOUNTER — Telehealth: Payer: Self-pay | Admitting: *Deleted

## 2021-05-22 VITALS — BP 98/64 | HR 90 | Resp 20 | Ht 65.0 in | Wt 148.0 lb

## 2021-05-22 DIAGNOSIS — I70235 Atherosclerosis of native arteries of right leg with ulceration of other part of foot: Secondary | ICD-10-CM | POA: Diagnosis not present

## 2021-05-22 DIAGNOSIS — L97511 Non-pressure chronic ulcer of other part of right foot limited to breakdown of skin: Secondary | ICD-10-CM

## 2021-05-22 MED ORDER — MUPIROCIN 2 % EX OINT: 1.0000 "application " | TOPICAL_OINTMENT | Freq: Every day | CUTANEOUS | 4 refills | Status: AC

## 2021-05-22 MED ORDER — OXYCODONE HCL 5 MG PO TABS: 5.0000 mg | ORAL_TABLET | Freq: Three times a day (TID) | ORAL | 0 refills | Status: AC | PRN

## 2021-05-22 NOTE — Progress Notes (Signed)
?Office Note  ? ? ? ?CC: Right foot first and second toe wounds ?Requesting Provider:  Manon Hilding, MD ? ?HPI: Kelli [name] is a Right 71 y.o. 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  She is unable to walk, unable to stand. She continues to live at home with her husband who is her caregiver.  He recently hurt his back supporting all of her weight. Kelli [name] is able to use a fork with her right hand, however is unable to perform any of her other ADLs.  ? ?I saw Kelli [name] several months ago due to need for long-term HD access.  She refused thigh graft, and is therefore been dialyzing through a tunneled HD line since.  [name] 'hates' dialysis, and continues to contemplate discontinuing it, knowing this would be the end of her life. ? ?She presents today at the request of podiatry.  Kelli [name] has known bilateral peripheral arterial disease with new right-sided first and second toe wounds.  She denies fevers, chills, drainage.  She has significant pain associated. ? ?The pt is on a statin for cholesterol management.  ?The pt is not on a daily aspirin.   Other AC: Eliquis ?The pt is on torsemide, metoprolol, hydralazine for hypertension.   ?The pt is diabetic.   ?Tobacco DP:OEUMPN ? ?Past Medical History:  ?Diagnosis Date  ? Anemia   ? Anxiety   ? Arthritis   ? Carotid artery occlusion   ? Occluded RICA, status post left CEA  August 2014 - Dr. Donnetta Hutching  ? Cerebral infarction ([place]) 09/2012  ? Bihemispheric watershed infarcts  ? 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 Closed dislocation of left humerus 07/26/2013  ? Depression   ? DM (diabetes mellitus), type 2 ([place])   ? ESRD on hemodialysis ([place])   ? Essential hypertension   ? Fibromyalgia   ? Headache   ? History of kidney stones   ? Mixed hyperlipidemia   ? Multiple gastric ulcers   ? Pneumonia   ? Urinary incontinence   ? ? ?Past Surgical History:  ?Procedure  Laterality Date  ? A/V FISTULAGRAM N/A 06/06/2019  ? Procedure: A/V FISTULAGRAM;  Surgeon: Marty Heck, MD;  Location: [place] CV LAB;  Service: Cardiovascular;  Laterality: N/A;  ? AV FISTULA PLACEMENT Left 04/15/2017  ? Procedure: ARTERIOVENOUS (AV) FISTULA CREATION LEFT ARM;  Surgeon: Serafina Mitchell, MD;  Location: [facility] OR;  Service: Vascular;  Laterality: Left;  ? AV FISTULA PLACEMENT Left 05/09/2017  ? Procedure: INSERTION OF ARTERIOVENOUS (AV) GORE-TEX GRAFT LEFT UPPER ARM;  Surgeon: [name], MD;  Location: [place];  Service: Vascular;  Laterality: Left;  ? AV FISTULA PLACEMENT Right 09/06/2018  ? Procedure: INSERTION OF ARTERIOVENOUS (AV) GORE-TEX GRAFT RIGHT  ARM;  Surgeon: [name], MD;  Location: [facility] OR;  Service: Vascular;  Laterality: Right;  ? AV FISTULA PLACEMENT Right 07/03/2020  ? Procedure: Removal of right upper arm Gortex graft;  Surgeon: Waynetta Sandy, MD;  Location: [place];  Service: Vascular;  Laterality: Right;  ? BASCILIC VEIN TRANSPOSITION Right 08/09/2018  ? Procedure: BASILIC VEIN TRANSPOSITION 1st Stage;  Surgeon: [name], MD;  Location: [place];  Service: Vascular;  Laterality: Right;  ? BIOPSY  01/22/2017  ? Procedure: BIOPSY;  Surgeon: Danie Binder, MD;  [facility];  Service: Endoscopy;;  gastric  ? BIOPSY  05/02/2021  ? Procedure:  BIOPSY;  Surgeon: Carol Ada, MD;  Location: [place];  Service: Gastroenterology;;  ? COMBINED HYSTERECTOMY VAGINAL W/ [place] / A&P REPAIR  1981  ? ENDARTERECTOMY Left 10/06/2012  ? Procedure: Carotid Endarterectomy with Finesse patch angioplasty;  Surgeon: [name], MD;  Location: [place];  Service: Vascular;  Laterality: Left;  ? ESOPHAGOGASTRODUODENOSCOPY N/A 04/26/2014  ? Procedure: ESOPHAGOGASTRODUODENOSCOPY (EGD);  Surgeon: Lear Ng, MD;  Location: [facility] ENDOSCOPY;  Service: Endoscopy;  Laterality: N/A;  ? ESOPHAGOGASTRODUODENOSCOPY N/A 01/22/2017  ? Procedure: ESOPHAGOGASTRODUODENOSCOPY (EGD);   Surgeon: Danie Binder, MD;  [facility];  Service: Endoscopy;  Laterality: N/A;  ? ESOPHAGOGASTRODUODENOSCOPY (EGD) WITH PROPOFOL N/A 05/02/2021  ? Procedure: ESOPHAGOGASTRODUODENOSCOPY (EGD) WITH PROPOFOL;  Surgeon: Carol Ada, MD;  Location: [place];  Service: Gastroenterology;  Laterality: N/A;  ? FLEXIBLE SIGMOIDOSCOPY N/A 01/22/2017  ? Procedure: FLEXIBLE SIGMOIDOSCOPY;  Surgeon: Danie Binder, MD;  [facility];  Service: Endoscopy;  Laterality: N/A;  ? INSERTION OF DIALYSIS CATHETER Right 07/03/2020  ? Procedure: INSERTION OF DIALYSIS CATHETER and exchange of right internal jugular dialysis catheter;  Surgeon: Waynetta Sandy, MD;  Location: [place];  Service: Vascular;  Laterality: Right;  ? IR CV LINE INJECTION  02/04/2017  ? IR CV LINE INJECTION  04/28/2018  ? IR CV LINE INJECTION  05/12/2018  ? IR CV LINE INJECTION  05/18/2018  ? IR FLUORO GUIDE CV LINE LEFT  01/14/2017  ? IR FLUORO GUIDE CV LINE LEFT  04/25/2017  ? IR FLUORO GUIDE CV LINE LEFT  05/13/2017  ? IR FLUORO GUIDE CV LINE LEFT  03/02/2018  ? IR FLUORO GUIDE CV LINE LEFT  04/28/2018  ? IR FLUORO GUIDE CV LINE LEFT  05/12/2018  ? IR FLUORO GUIDE CV LINE LEFT  05/18/2018  ? IR FLUORO GUIDE CV LINE LEFT  06/23/2018  ? IR FLUORO GUIDE CV LINE LEFT  08/02/2018  ? IR FLUORO GUIDE CV LINE RIGHT  08/22/2020  ? IR FLUORO GUIDE CV LINE RIGHT  04/29/2021  ? IR INTRAVASCULAR ULTRASOUND NON CORONARY  04/29/2021  ? IR PTA VENOUS EXCEPT DIALYSIS CIRCUIT  04/28/2018  ? IR PTA VENOUS EXCEPT DIALYSIS CIRCUIT  05/12/2018  ? IR PTA VENOUS EXCEPT DIALYSIS CIRCUIT  08/22/2020  ? IR PTA VENOUS EXCEPT DIALYSIS CIRCUIT  04/29/2021  ? IR REMOVAL TUN CV CATH W/O FL  07/08/2017  ? IR REMOVAL TUN CV CATH W/O FL  10/11/2018  ? IR TRANSCATH RETRIEVAL FB INCL GUIDANCE (MS)  05/12/2018  ? IR US GUIDE VASC ACCESS LEFT  01/14/2017  ? IR VENOCAVAGRAM SVC  04/29/2021  ? LOOP RECORDER IMPLANT  04/16/13  ? MDT LinQ implanted for cryptogenic stroke  ? PERIPHERAL VASCULAR  INTERVENTION Right 06/06/2019  ? Procedure: PERIPHERAL VASCULAR INTERVENTION;  Surgeon: Marty Heck, MD;  Location: [place] CV LAB;  Service: Cardiovascular;  Laterality: Right;  ? [name] WITHOUT CARDIOVERSION N/A 04/16/2013  ? Procedure: TRANSESOPHAGEAL ECHOCARDIOGRAM ([name]);  Surgeon: Josue Hector, MD;  Location: [place];  Service: Cardiovascular;  Laterality: N/A;  ? THROMBECTOMY AND REVISION OF ARTERIOVENTOUS (AV) GORETEX  GRAFT Left 07/24/2017  ? Procedure: THROMBECTOMY OF LEFT UPPER ARM ARTERIOVENTOUS (AV) GRAFT;  Surgeon: Conrad Sharpsburg, MD;  Location: [place];  Service: Vascular;  Laterality: Left;  ? URETHRAL DILATION  1980's  ? VAGINAL HYSTERECTOMY  1981  ? "partial" (10/04/2012)  ? ? ?Social History  ? ?Socioeconomic History  ? Marital status: Married  ?  Spouse name: Hydie Langan  ?  Number of children: 3  ? Years of education: Not on file  ? Highest education level: Bachelor's degree (e.g., BA, AB, BS)  ?Occupational History  ? Not on file  ?Tobacco Use  ? Smoking status: Former  ?  Packs/day: 1.00  ?  Years: 44.00  ?  Pack years: 44.00  ?  Types: Cigarettes  ?  Quit date: 10/04/2019  ?  Years since quitting: 1.6  ? Smokeless tobacco: Never  ?Vaping Use  ? Vaping Use: Never used  ?Substance and Sexual Activity  ? Alcohol use: No  ?  Alcohol/week: 0.0 standard drinks  ? Drug use: No  ? Sexual activity: Yes  ?  Birth control/protection: Surgical  ?  Comment: not too long ago  ?Other Topics Concern  ? Not on file  ?Social History Narrative  ? 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  ? ?Social Determinants of Health  ? ?Financial Resource Strain: Not on file  ?Food Insecurity: Not on file  ?Transportation Needs: Not on file  ?Physical Activity: Not on file  ?Stress: Not on file  ?Social Connections: Not on file  ?Intimate Partner Violence: Not on file  ? ? ?Family History  ?Problem Relation Age of Onset  ? Diabetes Mother   ? Diabetes  Father   ? Hypertension Sister   ? Diabetes Brother   ? Seizures Son   ? Kidney disease Maternal Grandmother   ? Hypertension Maternal Grandmother   ? Heart disease Maternal Grandfather   ? Diabetes Paternal Gr

## 2021-05-22 NOTE — Progress Notes (Signed)
?Chief Complaint  ?Patient presents with  ? Nail Problem  ?  PT right great toenail has completely fell of nail bed x 1 week. Redness and edema. Pt in unsure how she lost her nail.   ? ?  ? ?HPI: Patient is 71 y.o. female who presents today for pain and loss of toenail on the right great toe for about 1 week.  There is been redness and a purpleish discoloration to the tip of the right great toe as well as pain reported.  The patient reports pain at night with the inability to sleep.  She states that this occurs in her right greater than her left leg and foot.  She had a vascular study November 21, 2020 which showed decreased flow to the left greater than right foot and great toe.  She has tried no treatment and nothing makes the pain better. ? ?Patient Active Problem List  ? Diagnosis Date Noted  ? Acute anemia 04/29/2021  ? Hemodialysis catheter dysfunction ([place]) 04/28/2021  ? CAP (community acquired pneumonia) 01/15/2021  ? Hypoalbuminemia due to protein-calorie malnutrition ([place]) 01/15/2021  ? Mixed hyperlipidemia 01/15/2021  ? Atrial fibrillation, chronic ([place]) 01/15/2021  ? Adhesive capsulitis of shoulder 07/24/2020  ? Essential hypertension 07/03/2020  ? Hemodialysis catheter dysfunction, initial encounter ([place]) 07/02/2020  ? Mixed diabetic hyperlipidemia associated with type 2 diabetes mellitus ([place]) 07/02/2020  ? Ischemic chest pain ([place]) 09/11/2019  ? Chest pain 09/11/2019  ? Impingement syndrome of shoulder region 12/06/2017  ? History of arthroscopic procedure on shoulder 11/08/2017  ? Rotator cuff impingement syndrome 08/25/2017  ? Occult GI bleeding 01/28/2017  ? TIA (transient ischemic attack) 01/28/2017  ? Congestive heart failure ([place])   ? ESRD on hemodialysis ([place])   ? Pressure injury of skin 01/23/2017  ? End stage renal disease on dialysis [facility])   ? Gastritis and duodenitis   ? Acute metabolic encephalopathy 72/53/6644  ? Palliative care encounter   ? Goals of care, counseling/discussion   ? DNR  (do not resuscitate) discussion   ? Acute renal failure superimposed on stage 4 chronic kidney disease ([place]) 01/09/2017  ? Precordial pain   ? CKD (chronic kidney disease), stage IV ([place]) 01/07/2017  ? Acute respiratory failure with hypoxia ([place]) 01/07/2017  ? Anemia 01/06/2017  ? Type 2 diabetes mellitus with chronic kidney disease on chronic dialysis, with long-term current use of insulin ([place]) 01/06/2017  ? Onychomycosis 01/05/2017  ? 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  ? B12 deficiency 11/18/2016  ? Anemia in chronic kidney disease 11/18/2016  ? PUD (peptic ulcer disease) 10/13/2016  ? Pacemaker 10/13/2016  ? Chronic diastolic CHF (congestive heart failure) ([place]) 10/13/2016  ? Elevated troponin   ? Hypovolemic shock ([place]) 04/26/2014  ? Upper GI bleed   ? Leukocytosis, unspecified 07/30/2013  ? Thrombocytosis 07/30/2013  ? Leukocytosis 07/30/2013  ? Subluxation of left shoulder joint 07/27/2013  ? Closed dislocation of left humerus 07/26/2013  ? Hyperglycemia due to diabetes mellitus ([place]) 07/25/2013  ? Normocytic anemia 07/25/2013  ? Spastic hemiplegia affecting nondominant side ([place]) 07/06/2013  ? 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 Alterations of sensations, late effect of cerebrovascular disease 07/06/2013  ? UTI (urinary tract infection) 06/15/2013  ? Edema 05/28/2013  ? Knee pain 05/28/2013  ? Carotid stenosis, bilateral 05/22/2013  ? Fall at nursing home 05/20/2013  ? Hip pain 05/20/2013  ? Pain in joint, lower leg 05/19/2013  ? Chest congestion 05/12/2013  ? E. coli UTI  05/07/2013  ? Left shoulder pain due to subluxation and/or adhesive capsulitis.  05/07/2013  ? Pain in left shoulder 05/07/2013  ? Acute CVA (cerebrovascular accident): R PCA infarct per MRI 04/13/13 04/13/2013  ? Cerebral infarction ([place]) 04/13/2013  ? CVA (cerebral infarction) 04/08/2013  ? Acute ischemic stroke ([place]) 04/08/2013  ? Diplopia 04/08/2013  ? Left-sided weakness  04/08/2013  ? Vertigo 04/08/2013  ? Paresthesias in left hand 04/08/2013  ? Cerebellar stroke ([place]) 04/08/2013  ? Dizziness and giddiness 04/08/2013  ? Stroke ([place]) 04/08/2013  ? Depression 04/06/2013  ? Peripheral arterial disease ([place]) 03/16/2013  ? History of stroke 01/30/2013  ? CKD (chronic kidney disease) stage 3, GFR 30-59 ml/min (HCC) 01/16/2013  ? Generalized anxiety disorder 12/08/2012  ? Lumbago 12/08/2012  ? Anxiety state 12/08/2012  ? Urinary frequency 11/14/2012  ? Occlusion and stenosis of carotid artery with cerebral infarction 10/05/2012  ? Cerebral infarction due to unspecified occlusion or stenosis of unspecified carotid arteries 10/05/2012  ? Tobacco abuse 10/04/2012  ? Obesity 10/04/2012  ? ? ?Current Outpatient Medications on File Prior to Visit  ?Medication Sig Dispense Refill  ? acetaminophen (TYLENOL) 325 MG tablet Take 2 tablets (650 mg total) by mouth every 6 (six) hours as needed for mild pain (or Fever >/= 101).    ? amLODipine (NORVASC) 10 MG tablet Take 1 tablet (10 mg total) by mouth daily. 30 tablet 0  ? apixaban (ELIQUIS) 5 MG TABS tablet Take 1 tablet (5 mg total) by mouth 2 (two) times daily. 60 tablet 0  ? atorvastatin (LIPITOR) 40 MG tablet Take 40 mg by mouth every evening.     ? calcium acetate (PHOSLO) 667 MG capsule Take 2 capsules (1,334 mg total) by mouth 3 (three) times daily with meals. (Patient not taking: Reported on 04/28/2021) 180 capsule 2  ? clonazePAM (KLONOPIN) 0.5 MG tablet Take 0.5 mg by mouth daily.    ? insulin glargine (LANTUS) 100 UNIT/ML injection Inject 15 Units into the skin daily.    ? metoprolol succinate (TOPROL-XL) 100 MG 24 hr tablet Take 1 tablet (100 mg total) by mouth daily. (Patient taking differently: Take 100 mg by mouth every evening.) 30 tablet 0  ? ondansetron (ZOFRAN ODT) 8 MG disintegrating tablet Take 1 tablet (8 mg total) by mouth every 8 (eight) hours as needed for nausea or vomiting. (Patient not taking: Reported on 04/28/2021) 20  tablet 0  ? pantoprazole (PROTONIX) 40 MG tablet Take 1 tablet (40 mg total) by mouth 2 (two) times daily. 60 tablet 0  ? venlafaxine XR (EFFEXOR-XR) 150 MG 24 hr capsule Take 150 mg by mouth daily.     ? ?Current Facility-Administered Medications on File Prior to Visit  ?Medication Dose Route Frequency Provider Last Rate Last Admin  ? 0.9 %  sodium chloride infusion   Intravenous Continuous Monia Sabal, PA-C   New Bag at 09/06/18 1206  ? ? ?Allergies  ?Allergen Reactions  ? Hydrochlorothiazide Nausea And Vomiting  ? ? ?Review of Systems ?No fevers, chills, nausea, muscle aches, no difficulty breathing, no calf pain, no chest pain or shortness of breath. ? ? ?Physical Exam ? ?GENERAL APPEARANCE: Alert, conversant. Appropriately groomed.  She does appear to be in pain ? ?VASCULAR: Pedal pulses are non palpable DP and PT bilateral.  Capillary refill time is delayed to all digits,  Proximal to distal cooling is cool to cool.  Ischemia to right foot with tissue loss at right hallux and second toe noted.  Subjective  leg pain and cramping noted.  ? ?NEUROLOGIC: sensation is intact to 5.07 monofilament at 5/5 sites bilateral.  Light touch is intact bilateral, vibratory sensation intact bilateral ? ?MUSCULOSKELETAL: acceptable muscle strength, tone and stability bilateral.  No gross boney pedal deformities noted.  ? ?DERMATOLOGIC: pale skin of feet with cool to touch-  purplish discoloration to feet and toes noted.  Superficial tissue loss to the right hallux and second toe noted.  Superficial scab to the dorsal aspect of the left second toe is noted.  Minimal swelling is seen right greater than left, no streaking or lymphangitis is noted.  No active pus or purulence or drainage is seen. ? ? ? ?Assessment  ? ?  ICD-10-CM   ?1. Ischemic ulcer of foot, right, limited to breakdown of skin [facility])  L97.511 Ambulatory referral to Vascular Surgery  ?  ? ? ? ?Plan ? ?Discussed exam findings with the patient and her husband.   Discussed that with the superficial skin loss loss of the great toenail and generalized decreased pulses and appearance of the feet it does appear to be vascular in nature.  At today's visit we applied antibiotic oint

## 2021-05-23 DIAGNOSIS — D509 Iron deficiency anemia, unspecified: Secondary | ICD-10-CM | POA: Diagnosis not present

## 2021-05-23 DIAGNOSIS — N250 Renal osteodystrophy: Secondary | ICD-10-CM | POA: Diagnosis not present

## 2021-05-23 DIAGNOSIS — Z992 Dependence on renal dialysis: Secondary | ICD-10-CM | POA: Diagnosis not present

## 2021-05-23 DIAGNOSIS — N186 End stage renal disease: Secondary | ICD-10-CM | POA: Diagnosis not present

## 2021-05-23 DIAGNOSIS — D631 Anemia in chronic kidney disease: Secondary | ICD-10-CM | POA: Diagnosis not present

## 2021-05-26 DIAGNOSIS — N186 End stage renal disease: Secondary | ICD-10-CM | POA: Diagnosis not present

## 2021-05-26 DIAGNOSIS — D631 Anemia in chronic kidney disease: Secondary | ICD-10-CM | POA: Diagnosis not present

## 2021-05-26 DIAGNOSIS — D509 Iron deficiency anemia, unspecified: Secondary | ICD-10-CM | POA: Diagnosis not present

## 2021-05-26 DIAGNOSIS — N250 Renal osteodystrophy: Secondary | ICD-10-CM | POA: Diagnosis not present

## 2021-05-26 DIAGNOSIS — Z20822 Contact with and (suspected) exposure to covid-19: Secondary | ICD-10-CM | POA: Diagnosis not present

## 2021-05-26 DIAGNOSIS — E119 Type 2 diabetes mellitus without complications: Secondary | ICD-10-CM | POA: Diagnosis not present

## 2021-05-26 DIAGNOSIS — Z992 Dependence on renal dialysis: Secondary | ICD-10-CM | POA: Diagnosis not present

## 2021-05-27 ENCOUNTER — Other Ambulatory Visit: Payer: Self-pay

## 2021-05-27 ENCOUNTER — Emergency Department (HOSPITAL_COMMUNITY): Payer: Medicare Other

## 2021-05-27 ENCOUNTER — Inpatient Hospital Stay (HOSPITAL_COMMUNITY)
Admission: EM | Admit: 2021-05-27 | Discharge: 2021-05-28 | DRG: 951 | Disposition: A | Discharge status: Hospice | Payer: Medicare Other | Attending: Family Medicine | Admitting: Family Medicine

## 2021-05-27 ENCOUNTER — Encounter (HOSPITAL_COMMUNITY): Payer: Self-pay | Admitting: Emergency Medicine

## 2021-05-27 DIAGNOSIS — J9601 Acute respiratory failure with hypoxia: Secondary | ICD-10-CM | POA: Diagnosis present

## 2021-05-27 DIAGNOSIS — M797 Fibromyalgia: Secondary | ICD-10-CM | POA: Diagnosis present

## 2021-05-27 DIAGNOSIS — I5032 Chronic diastolic (congestive) heart failure: Secondary | ICD-10-CM | POA: Diagnosis present

## 2021-05-27 DIAGNOSIS — J690 Pneumonitis due to inhalation of food and vomit: Secondary | ICD-10-CM | POA: Diagnosis present

## 2021-05-27 DIAGNOSIS — G9341 Metabolic encephalopathy: Secondary | ICD-10-CM | POA: Diagnosis present

## 2021-05-27 DIAGNOSIS — N186 End stage renal disease: Secondary | ICD-10-CM | POA: Diagnosis present

## 2021-05-27 DIAGNOSIS — E782 Mixed hyperlipidemia: Secondary | ICD-10-CM | POA: Diagnosis present

## 2021-05-27 DIAGNOSIS — E1169 Type 2 diabetes mellitus with other specified complication: Secondary | ICD-10-CM | POA: Diagnosis present

## 2021-05-27 DIAGNOSIS — I70223 Atherosclerosis of native arteries of extremities with rest pain, bilateral legs: Secondary | ICD-10-CM | POA: Diagnosis not present

## 2021-05-27 DIAGNOSIS — Z66 Do not resuscitate: Secondary | ICD-10-CM | POA: Diagnosis present

## 2021-05-27 DIAGNOSIS — E669 Obesity, unspecified: Secondary | ICD-10-CM | POA: Diagnosis present

## 2021-05-27 DIAGNOSIS — Z7901 Long term (current) use of anticoagulants: Secondary | ICD-10-CM | POA: Diagnosis not present

## 2021-05-27 DIAGNOSIS — M1990 Unspecified osteoarthritis, unspecified site: Secondary | ICD-10-CM | POA: Diagnosis present

## 2021-05-27 DIAGNOSIS — R52 Pain, unspecified: Secondary | ICD-10-CM | POA: Diagnosis not present

## 2021-05-27 DIAGNOSIS — D631 Anemia in chronic kidney disease: Secondary | ICD-10-CM | POA: Diagnosis present

## 2021-05-27 DIAGNOSIS — R4182 Altered mental status, unspecified: Secondary | ICD-10-CM | POA: Diagnosis not present

## 2021-05-27 DIAGNOSIS — Z515 Encounter for palliative care: Secondary | ICD-10-CM | POA: Diagnosis not present

## 2021-05-27 DIAGNOSIS — I4892 Unspecified atrial flutter: Secondary | ICD-10-CM | POA: Diagnosis not present

## 2021-05-27 DIAGNOSIS — Z833 Family history of diabetes mellitus: Secondary | ICD-10-CM

## 2021-05-27 DIAGNOSIS — I480 Paroxysmal atrial fibrillation: Secondary | ICD-10-CM | POA: Diagnosis present

## 2021-05-27 DIAGNOSIS — I10 Essential (primary) hypertension: Secondary | ICD-10-CM | POA: Diagnosis present

## 2021-05-27 DIAGNOSIS — Z20822 Contact with and (suspected) exposure to covid-19: Secondary | ICD-10-CM | POA: Diagnosis present

## 2021-05-27 DIAGNOSIS — J189 Pneumonia, unspecified organism: Secondary | ICD-10-CM | POA: Diagnosis not present

## 2021-05-27 DIAGNOSIS — Z79899 Other long term (current) drug therapy: Secondary | ICD-10-CM

## 2021-05-27 DIAGNOSIS — E11649 Type 2 diabetes mellitus with hypoglycemia without coma: Secondary | ICD-10-CM | POA: Diagnosis present

## 2021-05-27 DIAGNOSIS — I70229 Atherosclerosis of native arteries of extremities with rest pain, unspecified extremity: Secondary | ICD-10-CM

## 2021-05-27 DIAGNOSIS — E1122 Type 2 diabetes mellitus with diabetic chronic kidney disease: Secondary | ICD-10-CM | POA: Diagnosis present

## 2021-05-27 DIAGNOSIS — Z743 Need for continuous supervision: Secondary | ICD-10-CM | POA: Diagnosis not present

## 2021-05-27 DIAGNOSIS — Z7189 Other specified counseling: Secondary | ICD-10-CM | POA: Diagnosis not present

## 2021-05-27 DIAGNOSIS — I998 Other disorder of circulatory system: Secondary | ICD-10-CM | POA: Diagnosis not present

## 2021-05-27 DIAGNOSIS — F32A Depression, unspecified: Secondary | ICD-10-CM | POA: Diagnosis not present

## 2021-05-27 DIAGNOSIS — Z992 Dependence on renal dialysis: Secondary | ICD-10-CM | POA: Diagnosis not present

## 2021-05-27 DIAGNOSIS — Z8673 Personal history of transient ischemic attack (TIA), and cerebral infarction without residual deficits: Secondary | ICD-10-CM

## 2021-05-27 DIAGNOSIS — Z8249 Family history of ischemic heart disease and other diseases of the circulatory system: Secondary | ICD-10-CM

## 2021-05-27 DIAGNOSIS — J90 Pleural effusion, not elsewhere classified: Secondary | ICD-10-CM

## 2021-05-27 DIAGNOSIS — I132 Hypertensive heart and chronic kidney disease with heart failure and with stage 5 chronic kidney disease, or end stage renal disease: Secondary | ICD-10-CM | POA: Diagnosis present

## 2021-05-27 DIAGNOSIS — E162 Hypoglycemia, unspecified: Secondary | ICD-10-CM

## 2021-05-27 DIAGNOSIS — R410 Disorientation, unspecified: Secondary | ICD-10-CM | POA: Diagnosis not present

## 2021-05-27 DIAGNOSIS — E1151 Type 2 diabetes mellitus with diabetic peripheral angiopathy without gangrene: Secondary | ICD-10-CM | POA: Diagnosis present

## 2021-05-27 DIAGNOSIS — Z794 Long term (current) use of insulin: Secondary | ICD-10-CM

## 2021-05-27 DIAGNOSIS — Z87891 Personal history of nicotine dependence: Secondary | ICD-10-CM

## 2021-05-27 DIAGNOSIS — I739 Peripheral vascular disease, unspecified: Secondary | ICD-10-CM | POA: Diagnosis present

## 2021-05-27 DIAGNOSIS — Z888 Allergy status to other drugs, medicaments and biological substances status: Secondary | ICD-10-CM

## 2021-05-27 LAB — LACTIC ACID, PLASMA: Lactic Acid, Venous: 2.4 mmol/L (ref 0.5–1.9)

## 2021-05-27 LAB — CBC WITH DIFFERENTIAL/PLATELET
Abs Immature Granulocytes: 0.06 10*3/uL (ref 0.00–0.07)
Basophils Absolute: 0.1 10*3/uL (ref 0.0–0.1)
Basophils Relative: 0 %
Eosinophils Absolute: 0.1 10*3/uL (ref 0.0–0.5)
Eosinophils Relative: 0 %
HCT: 25.7 % — ABNORMAL LOW (ref 36.0–46.0)
Hemoglobin: 7.4 g/dL — ABNORMAL LOW (ref 12.0–15.0)
Immature Granulocytes: 1 %
Lymphocytes Relative: 11 %
Lymphs Abs: 1.2 10*3/uL (ref 0.7–4.0)
MCH: 28.7 pg (ref 26.0–34.0)
MCHC: 28.8 g/dL — ABNORMAL LOW (ref 30.0–36.0)
MCV: 99.6 fL (ref 80.0–100.0)
Monocytes Absolute: 1.1 10*3/uL — ABNORMAL HIGH (ref 0.1–1.0)
Monocytes Relative: 9 %
Neutro Abs: 9.3 10*3/uL — ABNORMAL HIGH (ref 1.7–7.7)
Neutrophils Relative %: 79 %
Platelets: 278 10*3/uL (ref 150–400)
RBC: 2.58 MIL/uL — ABNORMAL LOW (ref 3.87–5.11)
RDW: 20 % — ABNORMAL HIGH (ref 11.5–15.5)
WBC: 11.8 10*3/uL — ABNORMAL HIGH (ref 4.0–10.5)
nRBC: 0 % (ref 0.0–0.2)

## 2021-05-27 LAB — RESP PANEL BY RT-PCR (FLU A&B, COVID) ARPGX2
Influenza A by PCR: NEGATIVE
Influenza B by PCR: NEGATIVE
SARS Coronavirus 2 by RT PCR: NEGATIVE

## 2021-05-27 LAB — COMPREHENSIVE METABOLIC PANEL
ALT: 11 U/L (ref 0–44)
AST: 21 U/L (ref 15–41)
Albumin: 2.9 g/dL — ABNORMAL LOW (ref 3.5–5.0)
Alkaline Phosphatase: 74 U/L (ref 38–126)
Anion gap: 18 — ABNORMAL HIGH (ref 5–15)
BUN: 21 mg/dL (ref 8–23)
CO2: 25 mmol/L (ref 22–32)
Calcium: 7.7 mg/dL — ABNORMAL LOW (ref 8.9–10.3)
Chloride: 97 mmol/L — ABNORMAL LOW (ref 98–111)
Creatinine, Ser: 4.2 mg/dL — ABNORMAL HIGH (ref 0.44–1.00)
GFR, Estimated: 11 mL/min — ABNORMAL LOW (ref >60)
Glucose, Bld: 67 mg/dL — ABNORMAL LOW (ref 70–99)
Potassium: 4.3 mmol/L (ref 3.5–5.1)
Sodium: 140 mmol/L (ref 135–145)
Total Bilirubin: 0.8 mg/dL (ref 0.3–1.2)
Total Protein: 6.7 g/dL (ref 6.5–8.1)

## 2021-05-27 LAB — TROPONIN I (HIGH SENSITIVITY): Troponin I (High Sensitivity): 19 ng/L — ABNORMAL HIGH (ref <18)

## 2021-05-27 LAB — BRAIN NATRIURETIC PEPTIDE: B Natriuretic Peptide: 1539 pg/mL — ABNORMAL HIGH (ref 0.0–100.0)

## 2021-05-27 LAB — CBG MONITORING, ED
Glucose-Capillary: 48 mg/dL — ABNORMAL LOW (ref 70–99)
Glucose-Capillary: 156 mg/dL — ABNORMAL HIGH (ref 70–99)

## 2021-05-27 MED ORDER — DEXTROSE 50 % IV SOLN
INTRAVENOUS | Status: AC
  Filled 2021-05-27: qty 50

## 2021-05-27 MED ORDER — VANCOMYCIN HCL 750 MG/150ML IV SOLN
750.0000 mg | INTRAVENOUS | Status: DC
  Filled 2021-05-27: qty 150

## 2021-05-27 MED ORDER — VANCOMYCIN HCL IN DEXTROSE 1-5 GM/200ML-% IV SOLN: 1000.0000 mg | Freq: Once | INTRAVENOUS | Status: DC

## 2021-05-27 MED ORDER — LORAZEPAM 2 MG/ML IJ SOLN
1.0000 mg | INTRAMUSCULAR | Status: DC | PRN
Start: 2021-05-27 — End: 2021-05-28

## 2021-05-27 MED ORDER — VANCOMYCIN HCL 1500 MG/300ML IV SOLN
1500.0000 mg | Freq: Once | INTRAVENOUS | Status: DC
Start: 2021-05-27 — End: 2021-05-27
  Filled 2021-05-27: qty 300

## 2021-05-27 MED ORDER — HALOPERIDOL LACTATE 5 MG/ML IJ SOLN: 0.5000 mg | INTRAMUSCULAR | Status: DC | PRN

## 2021-05-27 MED ORDER — GLYCOPYRROLATE 0.2 MG/ML IJ SOLN
0.2000 mg | INTRAMUSCULAR | Status: DC | PRN
  Administered 2021-05-28 (×3): 0.2 mg via INTRAVENOUS
  Filled 2021-05-27 (×3): qty 1

## 2021-05-27 MED ORDER — BIOTENE DRY MOUTH MT LIQD: 15.0000 mL | OROMUCOSAL | Status: DC | PRN

## 2021-05-27 MED ORDER — HALOPERIDOL 0.5 MG PO TABS: 0.5000 mg | ORAL_TABLET | ORAL | Status: DC | PRN

## 2021-05-27 MED ORDER — ACETAMINOPHEN 325 MG PO TABS: 650.0000 mg | ORAL_TABLET | Freq: Four times a day (QID) | ORAL | Status: DC | PRN

## 2021-05-27 MED ORDER — MAGIC MOUTHWASH: 15.0000 mL | Freq: Four times a day (QID) | ORAL | Status: DC | PRN

## 2021-05-27 MED ORDER — ONDANSETRON 4 MG PO TBDP: 4.0000 mg | ORAL_TABLET | Freq: Four times a day (QID) | ORAL | Status: DC | PRN

## 2021-05-27 MED ORDER — DEXTROSE 50 % IV SOLN
INTRAVENOUS | Status: AC
  Administered 2021-05-27: 25 g via INTRAVENOUS
  Filled 2021-05-27: qty 50

## 2021-05-27 MED ORDER — OXYCODONE-ACETAMINOPHEN 5-325 MG PO TABS
1.0000 | ORAL_TABLET | Freq: Once | ORAL | Status: AC
  Administered 2021-05-27: 1 via ORAL
  Filled 2021-05-27: qty 1

## 2021-05-27 MED ORDER — LORAZEPAM 2 MG/ML PO CONC: 1.0000 mg | ORAL | Status: DC | PRN

## 2021-05-27 MED ORDER — ONDANSETRON HCL 4 MG/2ML IJ SOLN: 4.0000 mg | Freq: Four times a day (QID) | INTRAMUSCULAR | Status: DC | PRN

## 2021-05-27 MED ORDER — DEXTROSE 50 % IV SOLN
50.0000 mL | Freq: Once | INTRAVENOUS | Status: AC
  Administered 2021-05-27: 50 mL via INTRAVENOUS

## 2021-05-27 MED ORDER — HYDROMORPHONE HCL 1 MG/ML IJ SOLN: 0.5000 mg | INTRAMUSCULAR | Status: DC | PRN

## 2021-05-27 MED ORDER — HYDROMORPHONE HCL 1 MG/ML IJ SOLN
0.5000 mg | INTRAMUSCULAR | Status: DC | PRN
  Administered 2021-05-27 (×2): 0.5 mg via INTRAVENOUS
  Administered 2021-05-27: 1 mg via INTRAVENOUS
  Administered 2021-05-28 (×2): 2 mg via INTRAVENOUS
  Administered 2021-05-28: 0.5 mg via INTRAVENOUS
  Filled 2021-05-27: qty 2
  Filled 2021-05-27 (×2): qty 1
  Filled 2021-05-27 (×2): qty 0.5
  Filled 2021-05-27: qty 2

## 2021-05-27 MED ORDER — GLYCOPYRROLATE 0.2 MG/ML IJ SOLN
0.2000 mg | INTRAMUSCULAR | Status: DC | PRN
Start: 2021-05-27 — End: 2021-05-28

## 2021-05-27 MED ORDER — DEXTROSE 50 % IV SOLN: 25.0000 g | Freq: Once | INTRAVENOUS | Status: AC

## 2021-05-27 MED ORDER — LORAZEPAM 1 MG PO TABS: 1.0000 mg | ORAL_TABLET | ORAL | Status: DC | PRN

## 2021-05-27 MED ORDER — HALOPERIDOL LACTATE 2 MG/ML PO CONC
0.5000 mg | ORAL | Status: DC | PRN
  Filled 2021-05-27: qty 0.3

## 2021-05-27 MED ORDER — SODIUM CHLORIDE 0.9 % IV SOLN: 2.0000 g | INTRAVENOUS | Status: DC

## 2021-05-27 MED ORDER — SODIUM CHLORIDE 0.9 % IV SOLN
2.0000 g | Freq: Once | INTRAVENOUS | Status: AC
  Administered 2021-05-27: 2 g via INTRAVENOUS
  Filled 2021-05-27: qty 2

## 2021-05-27 MED ORDER — ACETAMINOPHEN 650 MG RE SUPP: 650.0000 mg | Freq: Four times a day (QID) | RECTAL | Status: DC | PRN

## 2021-05-27 MED ORDER — POLYVINYL ALCOHOL 1.4 % OP SOLN: 1.0000 [drp] | Freq: Four times a day (QID) | OPHTHALMIC | Status: DC | PRN

## 2021-05-27 MED ORDER — GLYCOPYRROLATE 1 MG PO TABS
1.0000 mg | ORAL_TABLET | ORAL | Status: DC | PRN
  Filled 2021-05-27: qty 1

## 2021-05-27 NOTE — TOC Initial Note (Addendum)
Transition of Care (TOC) - Initial/Assessment Note  ? ? ?Patient Details  ?Name: Kelli [name] ?MRN: [id number] ?Date of Birth: [birth year] ? ?Transition of Care (TOC) CM/SW Contact:    ?Iona Beard, LCSWA ?Phone Number: ?05/27/2021, 3:55 PM ? ?Clinical Narrative:                 ?TOC consulted for need of hospice referral. CSW updated by NP with Palliative that pt and husband would like placement at [facility] for residential hospice with [facility]. CSW reached out to pts spouse who confirms that he would like to use [facility].  ? ?CSW spoke to [place] with [facility] who states that they will start working on referral. CSW also sent info to hospice via the [place]. CSW updated treatment team of this. TOC to follow.  ? ?CSW spoke with Marchia Meiers at [facility] who states that they do not currently have any beds at the facility. Pt will likely have to transition to a virtual bed to await bed opening at facility. Marchia Meiers states the assessment will be done tomorrow and they will update TOC when we can work on that. TOC to follow.  ? ?Expected Discharge Plan: [place] ?Barriers to Discharge: Continued Medical Work up ? ? ?Patient Goals and CMS Choice ?Patient states their goals for this hospitalization and ongoing recovery are:: [place] ?CMS Medicare.gov Compare Post Acute Care list provided to:: Patient Represenative (must comment) ?Choice offered to / list presented to : Spouse, Patient ? ?Expected Discharge Plan and Services ?Expected Discharge Plan: [place] ?In-house Referral: Clinical Social Work ?  ?Post Acute Care Choice: Hospice ?Living arrangements for the past 2 months: [place] ?                ?  ?  ?  ?  ?  ?  ?  ?  ?  ?  ? ?Prior Living Arrangements/Services ?Living arrangements for the past 2 months: [place] ?Lives with:: Spouse ?Patient language and need for interpreter reviewed:: Yes ?Do you feel safe going back to the  place where you live?: Yes      ?Need for Family Participation in Patient Care: Yes (Comment) ?Care giver support system in place?: Yes (comment) ?  ?Criminal Activity/Legal Involvement Pertinent to Current Situation/Hospitalization: No - Comment as needed ? ?Activities of Daily Living ?  ?  ? ?Permission Sought/Granted ?  ?  ?   ?   ?   ?   ? ?Emotional Assessment ?Appearance:: Appears stated age ?  ?  ?  ?Alcohol / Substance Use: Not Applicable ?Psych Involvement: No (comment) ? ?Admission diagnosis:  Acute respiratory failure with hypoxia ([place]) [J96.01] ?Patient Active Problem List  ? Diagnosis Date Noted  ? Hypoglycemia 05/27/2021  ? Pleural effusion, left 05/27/2021  ? Healthcare-associated pneumonia 05/27/2021  ? Critical ischemia of lower extremity ([place]) 05/27/2021  ? Anemia due to chronic kidney disease 05/27/2021  ? Acute anemia 04/29/2021  ? Hemodialysis catheter dysfunction ([place]) 04/28/2021  ? CAP (community acquired pneumonia) 01/15/2021  ? Hypoalbuminemia due to protein-calorie malnutrition ([place]) 01/15/2021  ? Mixed hyperlipidemia 01/15/2021  ? Atrial fibrillation, chronic ([place]) 01/15/2021  ? Adhesive capsulitis of shoulder 07/24/2020  ? Essential hypertension 07/03/2020  ? Hemodialysis catheter dysfunction, initial encounter ([place]) 07/02/2020  ? Mixed diabetic hyperlipidemia associated with type 2 diabetes mellitus ([place]) 07/02/2020  ? Ischemic chest pain ([place]) 09/11/2019  ? Chest pain 09/11/2019  ?  Impingement syndrome of shoulder region 12/06/2017  ? History of arthroscopic procedure on shoulder 11/08/2017  ? Rotator cuff impingement syndrome 08/25/2017  ? Occult GI bleeding 01/28/2017  ? TIA (transient ischemic attack) 01/28/2017  ? Congestive heart failure ([place])   ? ESRD on hemodialysis ([place])   ? Pressure injury of skin 01/23/2017  ? End stage renal disease on dialysis [facility])   ? Gastritis and duodenitis   ? Acute metabolic encephalopathy 08/22/7626  ? Palliative care encounter   ? Goals of care,  counseling/discussion   ? DNR (do not resuscitate) discussion   ? Acute renal failure superimposed on stage 4 chronic kidney disease ([place]) 01/09/2017  ? Precordial pain   ? CKD (chronic kidney disease), stage IV ([place]) 01/07/2017  ? Acute respiratory failure with hypoxia ([place]) 01/07/2017  ? Anemia 01/06/2017  ? Type 2 diabetes mellitus with chronic kidney disease on chronic dialysis, with long-term current use of insulin ([place]) 01/06/2017  ? Onychomycosis 01/05/2017  ? 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  ? B12 deficiency 11/18/2016  ? Anemia in chronic kidney disease 11/18/2016  ? PUD (peptic ulcer disease) 10/13/2016  ? Pacemaker 10/13/2016  ? Chronic diastolic CHF (congestive heart failure) ([place]) 10/13/2016  ? Elevated troponin   ? Hypovolemic shock ([place]) 04/26/2014  ? Upper GI bleed   ? Leukocytosis, unspecified 07/30/2013  ? Thrombocytosis 07/30/2013  ? Leukocytosis 07/30/2013  ? Subluxation of left shoulder joint 07/27/2013  ? Closed dislocation of left humerus 07/26/2013  ? Hyperglycemia due to diabetes mellitus ([place]) 07/25/2013  ? Normocytic anemia 07/25/2013  ? Spastic hemiplegia affecting nondominant side ([place]) 07/06/2013  ? 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 Alterations of sensations, late effect of cerebrovascular disease 07/06/2013  ? UTI (urinary tract infection) 06/15/2013  ? Edema 05/28/2013  ? Knee pain 05/28/2013  ? Carotid stenosis, bilateral 05/22/2013  ? Fall at nursing home 05/20/2013  ? Hip pain 05/20/2013  ? Pain in joint, lower leg 05/19/2013  ? Chest congestion 05/12/2013  ? E. coli UTI 05/07/2013  ? Left shoulder pain due to subluxation and/or adhesive capsulitis.  05/07/2013  ? Pain in left shoulder 05/07/2013  ? Acute CVA (cerebrovascular accident): R PCA infarct per MRI 04/13/13 04/13/2013  ? Cerebral infarction ([place]) 04/13/2013  ? CVA (cerebral infarction) 04/08/2013  ? Acute ischemic stroke ([place]) 04/08/2013  ? Diplopia 04/08/2013  ?  Left-sided weakness 04/08/2013  ? Vertigo 04/08/2013  ? Paresthesias in left hand 04/08/2013  ? Cerebellar stroke ([place]) 04/08/2013  ? Dizziness and giddiness 04/08/2013  ? Stroke ([place]) 04/08/2013  ? Depression 04/06/2013  ? Peripheral arterial disease ([place]) 03/16/2013  ? History of stroke 01/30/2013  ? CKD (chronic kidney disease) stage 3, GFR 30-59 ml/min (HCC) 01/16/2013  ? Generalized anxiety disorder 12/08/2012  ? Lumbago 12/08/2012  ? Anxiety state 12/08/2012  ? Urinary frequency 11/14/2012  ? Occlusion and stenosis of carotid artery with cerebral infarction 10/05/2012  ? Cerebral infarction due to unspecified occlusion or stenosis of unspecified carotid arteries 10/05/2012  ? Tobacco abuse 10/04/2012  ? Obesity 10/04/2012  ? ?PCP:  Manon Hilding, MD ?Pharmacy:   ?[facility] [address] ?[place] [address] ?Phone: [phone] Fax: [phone] ? ?[facility] ?[address] ?[place] [address] ?Phone: [phone] Fax: [phone] ? ? ? ? ?Social Determinants of Health (SDOH) Interventions ?  ? ?Readmission Risk Interventions ? ?  05/04/2021  ?  12:41 PM 01/16/2021  ?  2:39 PM  ?Readmission Risk Prevention Plan  ?Transportation Screening Complete Complete  ?PCP or Specialist Appt within 3-5 Days Complete   ?[place] or Home Care Consult Complete Complete  ?Social Work Consult for [place] Planning/Counseling Complete Complete  ?Palliative Care Screening Not Applicable Not Applicable  ?Medication Review Press photographer) Complete Complete  ? ? ? ?

## 2021-05-27 NOTE — Progress Notes (Signed)
Pharmacy Antibiotic Note ? ?Kelli [name] is a 71 y.o. female admitted on 05/27/2021 with pneumonia.  Pharmacy has been consulted for Vancomycin and cefepime dosing. ? ?Plan: ?Vancomycin '1500mg'$  IV loading dose, then '750mg'$  IV after every HD session T, TH, and Sat ?Cefepime 2gm IV after every HD session T, Th, and Sat ?F/U cxs and clinical progress ?Monitor V/S, labs and levels as indicated  ? ?Temp (24hrs), Avg:97 ?F (36.1 ?C), Min:97 ?F (36.1 ?C), Max:97 ?F (36.1 ?C) ? ?Recent Labs  ?Lab 05/27/21 ?1342  ?WBC 11.8*  ?CREATININE 4.20*  ?  ?Estimated Creatinine Clearance: 11.2 mL/min (A) (by C-G formula based on SCr of 4.2 mg/dL (H)).   ? ?Allergies  ?Allergen Reactions  ? Hydrochlorothiazide Nausea And Vomiting  ? ? ?Antimicrobials this admission: ?Vancomycin 3/29 >>  ?Cefepime 3/29 >>  ? ?Microbiology results: ?3/29 BCx: pending ?3/29 UCx: pending  ? MRSA PCR:  ? ?Thank you for allowing pharmacy to be a part of this patient?s care. ? ?Isac Sarna, BS Pharm D, BCPS ?Clinical Pharmacist ?Pager [phone] ? ?05/27/2021 3:06 PM ? ?

## 2021-05-27 NOTE — ED Notes (Signed)
Patient ate single serving apple sauce cup and drank 2 ounces of soda. ?

## 2021-05-27 NOTE — ED Notes (Signed)
Hospitalist at bedside 

## 2021-05-27 NOTE — H&P (Addendum)
? ?                                                                                                       TRH H&P ? ? Patient Demographics:  ? ? Kelli [name], is a 71 y.o. female  MRN: [id number]   DOB - February 27, 1951 ? ?Admit Date - 05/27/2021 ? ?Outpatient Primary MD for the patient is [name], [name], MD ? ?Referring MD/NP/PA: Dr Roderic Palau ? ?Patient coming from:  ? ?Chief Complaint  ?Patient presents with  ? Altered Mental Status  ?  ? ? HPI:  ? ? Kelli [name]  is a 71 y.o. female, 71 year old female with history of ESRD on TTS schedule who lives in [place], [place] type II, chronic diastolic CHF, paroxysmal atrial fibrillation [place] vas 2 score of greater. ?-She was brought to ED by her husband as she is very fatigued, weak, deconditioned, and her hemodialysis yesterday, she was fatigued this morning, husband gave her her insulin in the morning, but she did not eat anything, he became more confused, no focal deficits noted, loss of consciousness, overall patient has been deconditioned, very poor life quality, she was recently seen by vascular surgery couple days ago due to bilateral lower extremity ischemia, deemed high risk of mortality for revascularization, with recommendation for bilateral amputation, patient has declined, and with multiple recent hospitalization, patient was noted to be hypoglycemic with CBG of 48, she received D50 push with mentation back to baseline where she is currently awake, alert and appropriate. ?-In ED work-up significant for hypoxia in the 70s and 80s requiring 4 L oxygen, chest x-ray significant for left lung opacity and pleural effusion, apparently patient is coughing with eating, lactic acid elevated at 2.4, white blood cell elevated at 11.8, hemoglobin is low at 7.4, patient in significant amount of discomfort, very poor life quality with multiple recent hospitalization, I plan on discussing with patient and husband decision has been made to proceed with comfort measures. ? ? ? Review of  systems:  ?  ?In addition to the HPI above,  ? ?A full 10 point Review of Systems was done, except as stated above, all other Review of Systems were negative. ? ? ?With Past History of the following :  ? ? ?Past Medical History:  ?Diagnosis Date  ? Anemia   ? Anxiety   ? Arthritis   ? Carotid artery occlusion   ? Occluded RICA, status post left CEA  August 2014 - Dr. Donnetta Hutching  ? Cerebral infarction ([place]) 09/2012  ? Bihemispheric watershed infarcts  ? 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 Closed dislocation of left humerus 07/26/2013  ? Depression   ? DM (diabetes mellitus), type 2 ([place])   ? ESRD on hemodialysis ([place])   ? Essential hypertension   ? Fibromyalgia   ? Headache   ? History of kidney stones   ? Mixed hyperlipidemia   ? Multiple gastric ulcers   ? Pneumonia   ? Urinary incontinence   ?   ? ?Past Surgical History:  ?Procedure Laterality Date  ?  A/V FISTULAGRAM N/A 06/06/2019  ? Procedure: A/V FISTULAGRAM;  Surgeon: Marty Heck, MD;  Location: [facility];  Service: Cardiovascular;  Laterality: N/A;  ? AV FISTULA PLACEMENT Left 04/15/2017  ? Procedure: ARTERIOVENOUS (AV) FISTULA CREATION LEFT ARM;  Surgeon: Serafina Mitchell, MD;  [facility];  Service: Vascular;  Laterality: Left;  ? AV FISTULA PLACEMENT Left 05/09/2017  ? Procedure: INSERTION OF ARTERIOVENOUS (AV) GORE-TEX GRAFT LEFT UPPER ARM;  Surgeon: [name], MD;  Location: [facility];  Service: Vascular;  Laterality: Left;  ? AV FISTULA PLACEMENT Right 09/06/2018  ? Procedure: INSERTION OF ARTERIOVENOUS (AV) GORE-TEX GRAFT RIGHT  ARM;  Surgeon: [name], MD;  [facility];  Service: Vascular;  Laterality: Right;  ? AV FISTULA PLACEMENT Right 07/03/2020  ? Procedure: Removal of right upper arm Gortex graft;  Surgeon: Waynetta Sandy, MD;  Location: [facility];  Service: Vascular;  Laterality: Right;  ? BASCILIC VEIN TRANSPOSITION Right 08/09/2018  ? Procedure: BASILIC VEIN TRANSPOSITION 1st Stage;   Surgeon: [name], MD;  Location: [facility];  Service: Vascular;  Laterality: Right;  ? BIOPSY  01/22/2017  ? Procedure: BIOPSY;  Surgeon: Danie Binder, MD;  [facility];  Service: Endoscopy;;  gastric  ? BIOPSY  05/02/2021  ? Procedure: BIOPSY;  Surgeon: Carol Ada, MD;  Location: [facility];  Service: Gastroenterology;;  ? COMBINED HYSTERECTOMY VAGINAL W/ [place] / A&P REPAIR  1981  ? ENDARTERECTOMY Left 10/06/2012  ? Procedure: Carotid Endarterectomy with Finesse patch angioplasty;  Surgeon: [name], MD;  Location: [facility];  Service: Vascular;  Laterality: Left;  ? ESOPHAGOGASTRODUODENOSCOPY N/A 04/26/2014  ? Procedure: ESOPHAGOGASTRODUODENOSCOPY (EGD);  Surgeon: Lear Ng, MD;  Location: [facility];  Service: Endoscopy;  Laterality: N/A;  ? ESOPHAGOGASTRODUODENOSCOPY N/A 01/22/2017  ? Procedure: ESOPHAGOGASTRODUODENOSCOPY (EGD);  Surgeon: Danie Binder, MD;  [facility];  Service: Endoscopy;  Laterality: N/A;  ? ESOPHAGOGASTRODUODENOSCOPY (EGD) WITH PROPOFOL N/A 05/02/2021  ? Procedure: ESOPHAGOGASTRODUODENOSCOPY (EGD) WITH PROPOFOL;  Surgeon: Carol Ada, MD;  Location: [facility];  Service: Gastroenterology;  Laterality: N/A;  ? FLEXIBLE SIGMOIDOSCOPY N/A 01/22/2017  ? Procedure: FLEXIBLE SIGMOIDOSCOPY;  Surgeon: Danie Binder, MD;  [facility];  Service: Endoscopy;  Laterality: N/A;  ? INSERTION OF DIALYSIS CATHETER Right 07/03/2020  ? Procedure: INSERTION OF DIALYSIS CATHETER and exchange of right internal jugular dialysis catheter;  Surgeon: Waynetta Sandy, MD;  Location: [facility];  Service: Vascular;  Laterality: Right;  ? IR CV LINE INJECTION  02/04/2017  ? IR CV LINE INJECTION  04/28/2018  ? IR CV LINE INJECTION  05/12/2018  ? IR CV LINE INJECTION  05/18/2018  ? IR FLUORO GUIDE CV LINE LEFT  01/14/2017  ? IR FLUORO GUIDE CV LINE LEFT  04/25/2017  ? IR FLUORO GUIDE CV LINE LEFT  05/13/2017  ? IR FLUORO GUIDE CV LINE LEFT  03/02/2018  ? IR FLUORO GUIDE  CV LINE LEFT  04/28/2018  ? IR FLUORO GUIDE CV LINE LEFT  05/12/2018  ? IR FLUORO GUIDE CV LINE LEFT  05/18/2018  ? IR FLUORO GUIDE CV LINE LEFT  06/23/2018  ? IR FLUORO GUIDE CV LINE LEFT  08/02/2018  ? IR FLUORO GUIDE CV LINE RIGHT  08/22/2020  ? IR FLUORO GUIDE CV LINE RIGHT  04/29/2021  ? IR INTRAVASCULAR ULTRASOUND NON CORONARY  04/29/2021  ? IR PTA VENOUS EXCEPT DIALYSIS CIRCUIT  04/28/2018  ? IR PTA VENOUS EXCEPT  DIALYSIS CIRCUIT  05/12/2018  ? IR PTA VENOUS EXCEPT DIALYSIS CIRCUIT  08/22/2020  ? IR PTA VENOUS EXCEPT DIALYSIS CIRCUIT  04/29/2021  ? IR REMOVAL TUN CV CATH W/O FL  07/08/2017  ? IR REMOVAL TUN CV CATH W/O FL  10/11/2018  ? IR TRANSCATH RETRIEVAL FB INCL GUIDANCE (MS)  05/12/2018  ? IR US GUIDE VASC ACCESS LEFT  01/14/2017  ? IR VENOCAVAGRAM SVC  04/29/2021  ? LOOP RECORDER IMPLANT  04/16/13  ? MDT LinQ implanted for cryptogenic stroke  ? PERIPHERAL VASCULAR INTERVENTION Right 06/06/2019  ? Procedure: PERIPHERAL VASCULAR INTERVENTION;  Surgeon: Marty Heck, MD;  Location: [facility];  Service: Cardiovascular;  Laterality: Right;  ? [name] WITHOUT CARDIOVERSION N/A 04/16/2013  ? Procedure: TRANSESOPHAGEAL ECHOCARDIOGRAM ([name]);  Surgeon: Josue Hector, MD;  Location: [facility];  Service: Cardiovascular;  Laterality: N/A;  ? THROMBECTOMY AND REVISION OF ARTERIOVENTOUS (AV) GORETEX  GRAFT Left 07/24/2017  ? Procedure: THROMBECTOMY OF LEFT UPPER ARM ARTERIOVENTOUS (AV) GRAFT;  Surgeon: Conrad [place], MD;  Location: [facility];  Service: Vascular;  Laterality: Left;  ? URETHRAL DILATION  1980's  ? VAGINAL HYSTERECTOMY  1981  ? "partial" (10/04/2012)  ? ? ? ? Social History:  ? ?  ?Social History  ? ?Tobacco Use  ? Smoking status: Former  ?  Packs/day: 1.00  ?  Years: 44.00  ?  Pack years: 44.00  ?  Types: Cigarettes  ?  Quit date: 10/04/2019  ?  Years since quitting: 1.6  ? Smokeless tobacco: Never  ?Substance Use Topics  ? Alcohol use: No  ?  Alcohol/week: 0.0 standard drinks  ?  ? ? ? Family History :  ? ?  ?Family  History  ?Problem Relation Age of Onset  ? Diabetes Mother   ? Diabetes Father   ? Hypertension Sister   ? Diabetes Brother   ? Seizures Son   ? Kidney disease Maternal Grandmother   ? Hypertension Mate

## 2021-05-27 NOTE — ED Notes (Signed)
Instructed lab not to obtain lactic acid.  ?

## 2021-05-27 NOTE — ED Triage Notes (Signed)
Pt the ED via [facility] from home with complaints of Altered mental status per the husband. Pt also has bilateral foot pain and has been consulted regarding some toe amputations. ? ?The pt is a dialysis patient and last had dialysis yesterday 05/26/21. The pt is on a TRS schedule. ? ?

## 2021-05-27 NOTE — Consult Note (Signed)
? ?                                                                                ?Consultation Note ?Date: 05/27/2021  ? ?Patient Name: Kelli [name]  ?DOB: 01-Nov-1950  MRN: [id number]  Age / Sex: 71 y.o., female  ?PCP: Manon Hilding, MD ?Referring Physician: Albertine Patricia, MD ? ?Reason for Consultation: Establishing goals of care and Hospice Evaluation ? ?HPI/Patient Profile: 71 y.o. female  with past medical history of ESRD on HD, DM, CHF PVD, recent vascular surgery recommending bilateral lower extremity amputations which Mrs. Nicolls declines admitted on 05/27/2021 with end-of-life care.  ? ?Clinical Assessment and Goals of Care: ?I have reviewed medical records including EPIC notes, labs and imaging, received report from RN, assessed the patient and then call to spouse, Joan Herschberger, to discuss diagnosis prognosis, GOC, EOL wishes, disposition and options.  Mrs. Dicke has been seen by the inpatient palliative team in the past.  I introduced Palliative Medicine as specialized medical care for people living with serious illness. It focuses on providing relief from the symptoms and stress of a serious illness. The goal is to improve quality of life for both the patient and the family. ? ?We focused on their current illness.  Mr. Grunden endorses that they have elected for comfort care.  He is agreeable to residential hospice referral.  We discussed locations, provider choice offered.  Mr. Defranco request hospice of [facility].  The natural disease trajectory and expectations at EOL were discussed. ? ?The difference between aggressive medical intervention and comfort care was considered in light of the patient's goals of care.  ? ?Advanced directives, concepts specific to code status, were considered and discussed.  Mrs. Halberg is DNR. ? ?Discussed the importance of continued conversation with family and the medical providers regarding overall plan of care and treatment options, ensuring decisions are within  the context of the patient?s values and GOCs.  Questions and concerns were addressed.  The family was encouraged to call with questions or concerns.  PMT will continue to support holistically. ? ?Conference with attending, transition of care team related to patient condition, needs, goals of care, request for comfort and dignity at end-of-life, residential hospice at [facility] ? ?HCPOA  ?NEXT OF KIN -spouse, Jalena Vanderlinden ?  ? ?SUMMARY OF RECOMMENDATIONS   ?Requesting comfort and dignity at end-of-life, residential hospice with [facility] ? ?Code Status/Advance Care Planning: ?DNR ? ?Symptom Management:  ?End-of-life order set implemented ? ?Palliative Prophylaxis:  ?Frequent Pain Assessment and Oral Care ? ?Additional Recommendations (Limitations, Scope, Preferences): ?Full Comfort Care ? ?Psycho-social/Spiritual:  ?Desire for further Chaplaincy support:no ?Additional Recommendations: Caregiving  Support/Resources and Education on Hospice ? ?Prognosis:  ?< 2 weeks, family is requesting comfort care, no further hemodialysis, let nature take its course ? ?Discharge Planning:  Requesting comfort and dignity at end-of-life, residential hospice.   ? ?  ? ?Primary Diagnoses: ?Present on Admission: ? Acute respiratory failure with hypoxia ([place]) ? Essential hypertension ? Mixed diabetic hyperlipidemia associated with type 2 diabetes mellitus ([place]) ? Obesity ? Peripheral arterial disease ([place]) ? Depression ? Chronic diastolic CHF (congestive heart failure) ([place]) ? ? ?  I have reviewed the medical record, interviewed the patient and family, and examined the patient. The following aspects are pertinent. ? ?Past Medical History:  ?Diagnosis Date  ? Anemia   ? Anxiety   ? Arthritis   ? Carotid artery occlusion   ? Occluded RICA, status post left CEA  August 2014 - Dr. Donnetta Hutching  ? Cerebral infarction ([place]) 09/2012  ? Bihemispheric watershed infarcts  ? 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 Closed  dislocation of left humerus 07/26/2013  ? Depression   ? DM (diabetes mellitus), type 2 ([place])   ? ESRD on hemodialysis ([place])   ? Essential hypertension   ? Fibromyalgia   ? Headache   ? History of kidney stones   ? Mixed hyperlipidemia   ? Multiple gastric ulcers   ? Pneumonia   ? Urinary incontinence   ? ?Social History  ? ?Socioeconomic History  ? Marital status: Married  ?  Spouse name: Trulee Hamstra  ? Number of children: 3  ? Years of education: Not on file  ? Highest education level: Bachelor's degree (e.g., BA, AB, BS)  ?Occupational History  ? Not on file  ?Tobacco Use  ? Smoking status: Former  ?  Packs/day: 1.00  ?  Years: 44.00  ?  Pack years: 44.00  ?  Types: Cigarettes  ?  Quit date: 10/04/2019  ?  Years since quitting: 1.6  ? Smokeless tobacco: Never  ?Vaping Use  ? Vaping Use: Never used  ?Substance and Sexual Activity  ? Alcohol use: No  ?  Alcohol/week: 0.0 standard drinks  ? Drug use: No  ? Sexual activity: Yes  ?  Birth control/protection: Surgical  ?  Comment: not too long ago  ?Other Topics Concern  ? Not on file  ?Social History Narrative  ? 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  ? ?Social Determinants of Health  ? ?Financial Resource Strain: Not on file  ?Food Insecurity: Not on file  ?Transportation Needs: Not on file  ?Physical Activity: Not on file  ?Stress: Not on file  ?Social Connections: Not on file  ? ?Family History  ?Problem Relation Age of Onset  ? Diabetes Mother   ? Diabetes Father   ? Hypertension Sister   ? Diabetes Brother   ? Seizures Son   ? Kidney disease Maternal Grandmother   ? Hypertension Maternal Grandmother   ? Heart disease Maternal Grandfather   ? Diabetes Paternal Grandmother   ? Heart disease Paternal Grandfather   ? ?Scheduled Meds: ?Continuous Infusions: ? [START ON 05/14/2021] ceFEPime (MAXIPIME) IV    ? vancomycin    ? [START ON 05/24/2021] vancomycin    ? ?PRN Meds:. ?Medications Prior to  Admission:  ?Prior to Admission medications   ?Medication Sig Start Date End Date Taking? Authorizing Provider  ?acetaminophen (TYLENOL) 325 MG tablet Take 2 tablets (650 mg total) by mouth every 6 (six) hours as needed for mild pain (or Fever >/= 101). 07/05/20  Yes Elgergawy, Silver Huguenin, MD  ?amLODipine (NORVASC) 10 MG tablet Take 1 tablet (10 mg total) by mouth daily. 05/04/21  Yes Thurnell Lose, MD  ?apixaban (ELIQUIS) 5 MG TABS tablet Take 1 tablet (5 mg total) by mouth 2 (two) times daily. 05/04/21  Yes Thurnell Lose, MD  ?atorvastatin (LIPITOR) 40 MG tablet Take 40 mg by mouth every evening.  05/17/18  Yes [provider]  ?clonazePAM (KLONOPIN) 0.5  MG tablet Take 0.5 mg by mouth daily.   Yes [provider]  ?hydrALAZINE (APRESOLINE) 50 MG tablet Take 50 mg by mouth daily. 05/18/21  Yes [provider]  ?insulin glargine (LANTUS) 100 UNIT/ML injection Inject 15 Units into the skin daily.   Yes [provider]  ?metoprolol succinate (TOPROL-XL) 100 MG 24 hr tablet Take 1 tablet (100 mg total) by mouth daily. ?Patient taking differently: Take 100 mg by mouth every evening. 07/24/17  Yes Conrad [place], MD  ?mupirocin ointment (BACTROBAN) 2 % Apply 1 application. topically daily. Apply to toes of the right foot at night- may cover with a light sock. 05/22/21  Yes Bronson Ing, DPM  ?oxyCODONE (OXY IR/ROXICODONE) 5 MG immediate release tablet Take 1 tablet (5 mg total) by mouth every 8 (eight) hours as needed for severe pain. 05/22/21  Yes Broadus John, MD  ?pantoprazole (PROTONIX) 40 MG tablet Take 1 tablet (40 mg total) by mouth 2 (two) times daily. 05/04/21  Yes Thurnell Lose, MD  ?venlafaxine XR (EFFEXOR-XR) 150 MG 24 hr capsule Take 150 mg by mouth daily.    Yes [provider]  ?calcium acetate (PHOSLO) 667 MG capsule Take 2 capsules (1,334 mg total) by mouth 3 (three) times daily with meals. ?Patient not taking: Reported on 05/27/2021 01/17/21   Barton Dubois, MD  ?ondansetron (ZOFRAN ODT) 8 MG disintegrating tablet Take 1 tablet (8 mg total) by mouth every 8 (eight) hours as needed for nausea or vomiting. ?Patient not taking: Reported on 05/27/2021 01/17/21   [facility]

## 2021-05-27 NOTE — ED Notes (Signed)
Per MD, use either extremity for IV access.  ?

## 2021-05-27 NOTE — ED Notes (Signed)
Receiving nurse to call back for report.  

## 2021-05-27 NOTE — Sepsis Progress Note (Signed)
eLink is following this Code Sepsis. °

## 2021-05-27 NOTE — ED Provider Notes (Signed)
Patient has decided to be DNR/DNI ?  ?Kelli Ferguson, MD ?05/27/21 1500 ? ?

## 2021-05-27 NOTE — ED Provider Notes (Signed)
?[facility] ?Provider Note ? ? ?CSN: [id number] ?Arrival date & time: 05/27/21  1304 ? ?  ? ?History ? ?Chief Complaint  ?Patient presents with  ? Altered Mental Status  ? ? ?Kelli [name] is a 71 y.o. female. ? ?Patient has a history ESRD and diabetes along with peripheral vascular disease.  And congestive heart failure.  The patient had dialysis yesterday and according to her husband she has been very fatigued did not eat anything yesterday.  She awoke this morning and was still fatigued he gave her her insulin in the morning and she did not eat anything.  She became more confused. ? ?The history is provided by a relative and the EMS personnel. No language interpreter was used.  ?Altered Mental Status ?Presenting symptoms: disorientation   ?Severity:  Severe ?Most recent episode:  Today ?Episode history:  Continuous ?Timing:  Constant ?Progression:  Unchanged ?Chronicity:  New ?Context: not alcohol use   ?Associated symptoms: no abdominal pain   ? ?  ? ?Home Medications ?Prior to Admission medications   ?Medication Sig Start Date End Date Taking? Authorizing Provider  ?acetaminophen (TYLENOL) 325 MG tablet Take 2 tablets (650 mg total) by mouth every 6 (six) hours as needed for mild pain (or Fever >/= 101). 07/05/20  Yes Elgergawy, Silver Huguenin, MD  ?amLODipine (NORVASC) 10 MG tablet Take 1 tablet (10 mg total) by mouth daily. 05/04/21  Yes Thurnell Lose, MD  ?apixaban (ELIQUIS) 5 MG TABS tablet Take 1 tablet (5 mg total) by mouth 2 (two) times daily. 05/04/21  Yes Thurnell Lose, MD  ?atorvastatin (LIPITOR) 40 MG tablet Take 40 mg by mouth every evening.  05/17/18  Yes [provider]  ?clonazePAM (KLONOPIN) 0.5 MG tablet Take 0.5 mg by mouth daily.   Yes [provider]  ?hydrALAZINE (APRESOLINE) 50 MG tablet Take 50 mg by mouth daily. 05/18/21  Yes [provider]  ?insulin glargine (LANTUS) 100 UNIT/ML injection Inject 15 Units into the skin daily.   Yes [provider]  ?metoprolol succinate (TOPROL-XL) 100 MG 24 hr tablet Take 1 tablet (100 mg total) by mouth daily. ?Patient taking differently: Take 100 mg by mouth every evening. 07/24/17  Yes Conrad Marshall, MD  ?mupirocin ointment (BACTROBAN) 2 % Apply 1 application. topically daily. Apply to toes of the right foot at night- may cover with a light sock. 05/22/21  Yes Bronson Ing, DPM  ?oxyCODONE (OXY IR/ROXICODONE) 5 MG immediate release tablet Take 1 tablet (5 mg total) by mouth every 8 (eight) hours as needed for severe pain. 05/22/21  Yes Broadus John, MD  ?pantoprazole (PROTONIX) 40 MG tablet Take 1 tablet (40 mg total) by mouth 2 (two) times daily. 05/04/21  Yes Thurnell Lose, MD  ?venlafaxine XR (EFFEXOR-XR) 150 MG 24 hr capsule Take 150 mg by mouth daily.    Yes [provider]  ?calcium acetate (PHOSLO) 667 MG capsule Take 2 capsules (1,334 mg total) by mouth 3 (three) times daily with meals. ?Patient not taking: Reported on 05/27/2021 01/17/21   Barton Dubois, MD  ?ondansetron (ZOFRAN ODT) 8 MG disintegrating tablet Take 1 tablet (8 mg total) by mouth every 8 (eight) hours as needed for nausea or vomiting. ?Patient not taking: Reported on 05/27/2021 01/17/21   Barton Dubois, MD  ?   ? ?Allergies    ?Hydrochlorothiazide   ? ?Review of Systems   ?Review of Systems  ?Unable to perform ROS: Mental status change  ?  Gastrointestinal:  Negative for abdominal pain.  ? ?Physical Exam ?Updated Vital Signs ?BP (!) 145/61   Pulse 74   Temp (!) 97 ?F (36.1 ?C) (Oral)   Resp 20   SpO2 96%  ?Physical Exam ?Vitals and nursing note reviewed.  ?Constitutional:   ?   Appearance: She is well-developed.  ?   Comments: Patient lethargic  ?HENT:  ?   Head: Normocephalic.  ?   Nose: Nose normal.  ?   Mouth/Throat:  ?   Comments: Dry mucous membranes ?Eyes:  ?   General: No scleral icterus. ?   Conjunctiva/sclera: Conjunctivae normal.  ?Neck:  ?   Thyroid: No thyromegaly.  ?Cardiovascular:  ?   Rate and  Rhythm: Regular rhythm.  ?   Heart sounds: No murmur heard. ?  No friction rub. No gallop.  ?   Comments: Tachycardia ?Pulmonary:  ?   Breath sounds: No stridor. Wheezing present. No rales.  ?Chest:  ?   Chest wall: No tenderness.  ?Abdominal:  ?   General: There is no distension.  ?   Tenderness: There is no abdominal tenderness. There is no rebound.  ?Musculoskeletal:     ?   General: Swelling present.  ?   Cervical back: Neck supple.  ?Lymphadenopathy:  ?   Cervical: No cervical adenopathy.  ?Skin: ?   General: Skin is dry.  ?   Findings: No erythema or rash.  ?Neurological:  ?   Motor: No abnormal muscle tone.  ?   Coordination: Coordination normal.  ?   Comments: Patient will not answer questions.  She is responding only to painful stimuli  ?Psychiatric:  ?   Comments: Lethargic  ? ? ?ED Results / Procedures / Treatments   ?Labs ?(all labs ordered are listed, but only abnormal results are displayed) ?Labs Reviewed  ?CBC WITH DIFFERENTIAL/PLATELET - Abnormal; Notable for the following components:  ?    Result Value  ? WBC 11.8 (*)   ? RBC 2.58 (*)   ? Hemoglobin 7.4 (*)   ? HCT 25.7 (*)   ? MCHC 28.8 (*)   ? RDW 20.0 (*)   ? Neutro Abs 9.3 (*)   ? Monocytes Absolute 1.1 (*)   ? All other components within normal limits  ?COMPREHENSIVE METABOLIC PANEL - Abnormal; Notable for the following components:  ? Chloride 97 (*)   ? Glucose, Bld 67 (*)   ? Creatinine, Ser 4.20 (*)   ? Calcium 7.7 (*)   ? Albumin 2.9 (*)   ? GFR, Estimated 11 (*)   ? Anion gap 18 (*)   ? All other components within normal limits  ?BRAIN NATRIURETIC PEPTIDE - Abnormal; Notable for the following components:  ? B Natriuretic Peptide 1,539.0 (*)   ? All other components within normal limits  ?CBG MONITORING, ED - Abnormal; Notable for the following components:  ? Glucose-Capillary 48 (*)   ? All other components within normal limits  ?CBG MONITORING, ED - Abnormal; Notable for the following components:  ? Glucose-Capillary 156 (*)   ? All other  components within normal limits  ?TROPONIN I (HIGH SENSITIVITY) - Abnormal; Notable for the following components:  ? Troponin I (High Sensitivity) 19 (*)   ? All other components within normal limits  ?CULTURE, BLOOD (SINGLE)  ?RESP PANEL BY RT-PCR (FLU A&B, COVID) [id number]  ?LACTIC ACID, PLASMA  ? ? ?EKG ?None ? ?Radiology ?DG Chest Port 1 View ? ?Result Date: 05/27/2021 ?CLINICAL DATA:  Shortness of  breath.  Diabetes and hypertension. EXAM: PORTABLE CHEST 1 VIEW COMPARISON:  05/11/2021 FINDINGS: Right internal jugular central line tip in the right atrium. Loop recorder in place. Enlargement of the left effusion with worsened infiltrate/volume loss in the left lung. Right lung as well aerated, but there may be mild interstitial edema. No visible effusion on the right. IMPRESSION: More density in the left chest probably related to a combination of pleural fluid accumulation and infiltrate/volume loss of the left lower lobe. Electronically Signed   By: Nelson Chimes M.D.   On: 05/27/2021 14:00   ? ?Procedures ?Procedures  ? ? ?Medications Ordered in ED ?Medications  ?vancomycin (VANCOREADY) IVPB 1500 mg/300 mL (has no administration in time range)  ?dextrose 50 % solution 50 mL (50 mLs Intravenous Given 05/27/21 1319)  ?dextrose 50 % solution 25 g (25 g Intravenous Given 05/27/21 1349)  ?ceFEPIme (MAXIPIME) 2 g in sodium chloride 0.9 % 100 mL IVPB (2 g Intravenous New Bag/Given 05/27/21 1429)  ?oxyCODONE-acetaminophen (PERCOCET/ROXICET) 5-325 MG per tablet 1 tablet (1 tablet Oral Given 05/27/21 1434)  ? ? ?ED Course/ Medical Decision Making/ A&P ?Patient was lethargic in the emergency department.  She had a glucose of 48.  She was given an amp of D50 the and she woke up.  Patient complains of significant fatigue.  Patient had dialysis yesterday.  Her husband stated that she was very tired after dialysis and this morning he gave her insulin to her but she did not eat.  He also states that she recently saw the vascular surgeon  who stated she needed both of her lower extremities removed.  She does not want to do that. ? ? ?CRITICAL CARE ?Performed by: Milton Ferguson ?Total critical care time: 50 minutes ?Critical care time was exclusiv

## 2021-05-28 ENCOUNTER — Inpatient Hospital Stay (HOSPITAL_COMMUNITY)
Admission: RE | Admit: 2021-05-28 | Discharge: 2021-05-30 | DRG: 951 | Disposition: E | Attending: Family Medicine | Admitting: Family Medicine

## 2021-05-28 ENCOUNTER — Encounter (HOSPITAL_COMMUNITY): Payer: Self-pay | Admitting: Oncology

## 2021-05-28 DIAGNOSIS — I5032 Chronic diastolic (congestive) heart failure: Secondary | ICD-10-CM | POA: Diagnosis present

## 2021-05-28 DIAGNOSIS — F419 Anxiety disorder, unspecified: Secondary | ICD-10-CM | POA: Diagnosis not present

## 2021-05-28 DIAGNOSIS — E669 Obesity, unspecified: Secondary | ICD-10-CM | POA: Diagnosis present

## 2021-05-28 DIAGNOSIS — Z87442 Personal history of urinary calculi: Secondary | ICD-10-CM

## 2021-05-28 DIAGNOSIS — D631 Anemia in chronic kidney disease: Secondary | ICD-10-CM | POA: Diagnosis not present

## 2021-05-28 DIAGNOSIS — I10 Essential (primary) hypertension: Secondary | ICD-10-CM | POA: Diagnosis not present

## 2021-05-28 DIAGNOSIS — E782 Mixed hyperlipidemia: Secondary | ICD-10-CM | POA: Diagnosis not present

## 2021-05-28 DIAGNOSIS — Z8673 Personal history of transient ischemic attack (TIA), and cerebral infarction without residual deficits: Secondary | ICD-10-CM

## 2021-05-28 DIAGNOSIS — Z87891 Personal history of nicotine dependence: Secondary | ICD-10-CM

## 2021-05-28 DIAGNOSIS — Z6824 Body mass index (BMI) 24.0-24.9, adult: Secondary | ICD-10-CM

## 2021-05-28 DIAGNOSIS — J9601 Acute respiratory failure with hypoxia: Secondary | ICD-10-CM | POA: Diagnosis present

## 2021-05-28 DIAGNOSIS — Z992 Dependence on renal dialysis: Secondary | ICD-10-CM

## 2021-05-28 DIAGNOSIS — I4892 Unspecified atrial flutter: Secondary | ICD-10-CM | POA: Diagnosis present

## 2021-05-28 DIAGNOSIS — E1165 Type 2 diabetes mellitus with hyperglycemia: Secondary | ICD-10-CM | POA: Diagnosis present

## 2021-05-28 DIAGNOSIS — E11649 Type 2 diabetes mellitus with hypoglycemia without coma: Secondary | ICD-10-CM | POA: Diagnosis not present

## 2021-05-28 DIAGNOSIS — Z515 Encounter for palliative care: Secondary | ICD-10-CM | POA: Diagnosis not present

## 2021-05-28 DIAGNOSIS — I70229 Atherosclerosis of native arteries of extremities with rest pain, unspecified extremity: Secondary | ICD-10-CM

## 2021-05-28 DIAGNOSIS — I480 Paroxysmal atrial fibrillation: Secondary | ICD-10-CM | POA: Diagnosis present

## 2021-05-28 DIAGNOSIS — E1151 Type 2 diabetes mellitus with diabetic peripheral angiopathy without gangrene: Secondary | ICD-10-CM | POA: Diagnosis present

## 2021-05-28 DIAGNOSIS — Z833 Family history of diabetes mellitus: Secondary | ICD-10-CM

## 2021-05-28 DIAGNOSIS — Z66 Do not resuscitate: Secondary | ICD-10-CM | POA: Diagnosis present

## 2021-05-28 DIAGNOSIS — Z7901 Long term (current) use of anticoagulants: Secondary | ICD-10-CM

## 2021-05-28 DIAGNOSIS — M797 Fibromyalgia: Secondary | ICD-10-CM | POA: Diagnosis present

## 2021-05-28 DIAGNOSIS — J690 Pneumonitis due to inhalation of food and vomit: Secondary | ICD-10-CM | POA: Diagnosis present

## 2021-05-28 DIAGNOSIS — Z8249 Family history of ischemic heart disease and other diseases of the circulatory system: Secondary | ICD-10-CM

## 2021-05-28 DIAGNOSIS — Z794 Long term (current) use of insulin: Secondary | ICD-10-CM

## 2021-05-28 DIAGNOSIS — F32A Depression, unspecified: Secondary | ICD-10-CM | POA: Diagnosis present

## 2021-05-28 DIAGNOSIS — E1122 Type 2 diabetes mellitus with diabetic chronic kidney disease: Secondary | ICD-10-CM | POA: Diagnosis present

## 2021-05-28 DIAGNOSIS — I5030 Unspecified diastolic (congestive) heart failure: Secondary | ICD-10-CM | POA: Diagnosis not present

## 2021-05-28 DIAGNOSIS — I132 Hypertensive heart and chronic kidney disease with heart failure and with stage 5 chronic kidney disease, or end stage renal disease: Secondary | ICD-10-CM | POA: Diagnosis present

## 2021-05-28 DIAGNOSIS — Z79899 Other long term (current) drug therapy: Secondary | ICD-10-CM

## 2021-05-28 DIAGNOSIS — I639 Cerebral infarction, unspecified: Secondary | ICD-10-CM | POA: Diagnosis not present

## 2021-05-28 DIAGNOSIS — Z8711 Personal history of peptic ulcer disease: Secondary | ICD-10-CM

## 2021-05-28 DIAGNOSIS — G9341 Metabolic encephalopathy: Secondary | ICD-10-CM | POA: Diagnosis present

## 2021-05-28 DIAGNOSIS — N186 End stage renal disease: Secondary | ICD-10-CM | POA: Diagnosis present

## 2021-05-28 DIAGNOSIS — I739 Peripheral vascular disease, unspecified: Secondary | ICD-10-CM | POA: Diagnosis not present

## 2021-05-28 DIAGNOSIS — Z888 Allergy status to other drugs, medicaments and biological substances status: Secondary | ICD-10-CM

## 2021-05-28 DIAGNOSIS — I4891 Unspecified atrial fibrillation: Secondary | ICD-10-CM | POA: Diagnosis not present

## 2021-05-28 DIAGNOSIS — Z7189 Other specified counseling: Secondary | ICD-10-CM | POA: Diagnosis not present

## 2021-05-28 MED ORDER — ONDANSETRON HCL 4 MG/2ML IJ SOLN: 4.0000 mg | Freq: Four times a day (QID) | INTRAMUSCULAR | Status: DC | PRN

## 2021-05-28 MED ORDER — LORAZEPAM 2 MG/ML IJ SOLN: 1.0000 mg | INTRAMUSCULAR | Status: DC | PRN

## 2021-05-28 MED ORDER — ONDANSETRON 4 MG PO TBDP
4.0000 mg | ORAL_TABLET | Freq: Four times a day (QID) | ORAL | Status: DC | PRN
Start: 2021-05-28 — End: 2021-05-29

## 2021-05-28 MED ORDER — BIOTENE DRY MOUTH MT LIQD: 15.0000 mL | OROMUCOSAL | Status: DC | PRN

## 2021-05-28 MED ORDER — HALOPERIDOL LACTATE 2 MG/ML PO CONC: 0.5000 mg | ORAL | Status: DC | PRN

## 2021-05-28 MED ORDER — MORPHINE 100MG IN NS 100ML (1MG/ML) PREMIX INFUSION
2.0000 mg/h | INTRAVENOUS | Status: DC
  Administered 2021-05-28: 2 mg/h via INTRAVENOUS
  Filled 2021-05-28: qty 100

## 2021-05-28 MED ORDER — ACETAMINOPHEN 650 MG RE SUPP: 650.0000 mg | Freq: Four times a day (QID) | RECTAL | Status: DC | PRN

## 2021-05-28 MED ORDER — GLYCOPYRROLATE 0.2 MG/ML IJ SOLN
0.2000 mg | INTRAMUSCULAR | Status: DC | PRN
  Administered 2021-05-28 – 2021-05-29 (×3): 0.2 mg via INTRAVENOUS
  Filled 2021-05-28 (×3): qty 1

## 2021-05-28 MED ORDER — ACETAMINOPHEN 325 MG PO TABS: 650.0000 mg | ORAL_TABLET | Freq: Four times a day (QID) | ORAL | Status: DC | PRN

## 2021-05-28 MED ORDER — LORAZEPAM 2 MG/ML PO CONC: 1.0000 mg | ORAL | Status: DC | PRN

## 2021-05-28 MED ORDER — GLYCOPYRROLATE 1 MG PO TABS: 1.0000 mg | ORAL_TABLET | ORAL | Status: DC | PRN

## 2021-05-28 MED ORDER — LORAZEPAM 1 MG PO TABS: 1.0000 mg | ORAL_TABLET | ORAL | Status: DC | PRN

## 2021-05-28 MED ORDER — HYDROMORPHONE HCL 1 MG/ML IJ SOLN: 0.5000 mg | INTRAMUSCULAR | Status: DC | PRN

## 2021-05-28 MED ORDER — GLYCOPYRROLATE 0.2 MG/ML IJ SOLN: 0.2000 mg | INTRAMUSCULAR | Status: DC | PRN

## 2021-05-28 MED ORDER — HALOPERIDOL LACTATE 5 MG/ML IJ SOLN: 0.5000 mg | INTRAMUSCULAR | Status: DC | PRN

## 2021-05-28 MED ORDER — MAGIC MOUTHWASH: 15.0000 mL | Freq: Four times a day (QID) | ORAL | Status: DC | PRN

## 2021-05-28 MED ORDER — POLYVINYL ALCOHOL 1.4 % OP SOLN
1.0000 [drp] | Freq: Four times a day (QID) | OPHTHALMIC | Status: DC | PRN
Start: 2021-05-28 — End: 2021-05-29

## 2021-05-28 MED ORDER — HALOPERIDOL 0.5 MG PO TABS
0.5000 mg | ORAL_TABLET | ORAL | Status: DC | PRN
Start: 2021-05-28 — End: 2021-05-29

## 2021-05-28 NOTE — Progress Notes (Deleted)
Inpatient Diabetes Program Recommendations ? ?AACE/ADA: New Consensus Statement on Inpatient Glycemic Control (2015) ? ?Target Ranges:  Prepandial:   less than 140 mg/dL ?     Peak postprandial:   less than 180 mg/dL (1-2 hours) ?     Critically ill patients:  140 - 180 mg/dL  ? ?Lab Results  ?Component Value Date  ? GLUCAP 156 (H) 05/27/2021  ? HGBA1C 4.9 04/29/2021  ? ? ?Review of Glycemic Control ? Latest Reference Range & Units 05/27/21 13:13 05/27/21 14:38  ?Glucose-Capillary 70 - 99 mg/dL 48 (L) 156 (H)  ?(L): Data is abnormally low ?(H): Data is abnormally high ? ?Diabetes history: DM2 ?Outpatient Diabetes medications: Lantus 15 units QD ?Current orders for Inpatient glycemic control: None ? ?Inpatient Diabetes Program Recommendations:   ? ?Please consider ordering CBG's AC/HS.  No CBG's since yesterday at 14:38. ? ?Will continue to follow while inpatient. ? ?Thank you, ?Reche Dixon, MSN, RN ?Diabetes Coordinator ?Inpatient Diabetes Program ?[phone] (team pager from 8a-5p), ? ?

## 2021-05-28 NOTE — H&P (Signed)
?History and Physical  ? ? ?Kelli [name]:[id number] DOB: 03/18/1950 DOA: 05/15/2021 ? ?PCP: Manon Hilding, MD  ?[facility] ? ?I have personally briefly reviewed patient's old medical records in [place] ? ?Chief Complaint: comfort care/shortness of breath ? ?HPI: Kelli [name] is a 71 y.o. female with medical history significant of ESRD on TTS schedule who lives in [place], [place] type II, chronic diastolic CHF, paroxysmal atrial fibrillation [place] vas 2 score of greater. ?-She was brought to ED by her husband as she is very fatigued, weak, deconditioned, and at her hemodialysis yesterday, she was fatigued this morning, husband gave her her insulin in the morning, but she did not eat anything, she became more confused, no focal deficits noted, loss of consciousness, overall patient has been deconditioned, very poor life quality, she was recently seen by vascular surgery couple days ago due to bilateral lower extremity ischemia, deemed high risk of mortality for revascularization, with recommendation for bilateral amputation, patient has declined, and with multiple recent hospitalization, patient was noted to be hypoglycemic with CBG of 48, she received D50 push with mentation back to baseline where she is currently awake, alert and appropriate. ?  ?-In ED work-up significant for hypoxia in the 70s and 80s requiring 4 L oxygen, chest x-ray significant for left lung opacity and pleural effusion, apparently patient is coughing with eating, lactic acid elevated at 2.4, white blood cell elevated at 11.8, hemoglobin is low at 7.4, patient in significant amount of discomfort, very poor life quality with multiple recent hospitalization.  Admitting hospitalist had a frank meaningful discussion with the family and patient and they all agreed to transition her to comfort care with no life-sustaining or life prolonging medications or measures including antibiotics.  Patient was accepted for  hospice however due to not having a physical bed available, decision was made to switch the patient as inpatient hospice at the [facility]. ? ?Review of Systems: As per HPI otherwise negative.  ? ? ?Past Medical History:  ?Diagnosis Date  ? Anemia   ? Anxiety   ? Arthritis   ? Carotid artery occlusion   ? Occluded RICA, status post left CEA  August 2014 - Dr. Donnetta Hutching  ? Cerebral infarction ([place]) 09/2012  ? Bihemispheric watershed infarcts  ? 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 Closed dislocation of left humerus 07/26/2013  ? Depression   ? DM (diabetes mellitus), type 2 ([place])   ? ESRD on hemodialysis ([place])   ? Essential hypertension   ? Fibromyalgia   ? Headache   ? History of kidney stones   ? Mixed hyperlipidemia   ? Multiple gastric ulcers   ? Pneumonia   ? Urinary incontinence   ? ? ?Past Surgical History:  ?Procedure Laterality Date  ? A/V FISTULAGRAM N/A 06/06/2019  ? Procedure: A/V FISTULAGRAM;  Surgeon: Marty Heck, MD;  Location: [facility];  Service: Cardiovascular;  Laterality: N/A;  ? AV FISTULA PLACEMENT Left 04/15/2017  ? Procedure: ARTERIOVENOUS (AV) FISTULA CREATION LEFT ARM;  Surgeon: Serafina Mitchell, MD;  Location: [facility];  Service: Vascular;  Laterality: Left;  ? AV FISTULA PLACEMENT Left 05/09/2017  ? Procedure: INSERTION OF ARTERIOVENOUS (AV) GORE-TEX GRAFT LEFT UPPER ARM;  Surgeon: [name], MD;  Location: [facility];  Service: Vascular;  Laterality: Left;  ? AV FISTULA PLACEMENT Right 09/06/2018  ? Procedure: INSERTION OF ARTERIOVENOUS (AV) GORE-TEX GRAFT RIGHT  ARM;  Surgeon: [name], MD;  [facility];  Service: Vascular;  Laterality: Right;  ? AV FISTULA PLACEMENT Right 07/03/2020  ? Procedure: Removal of right upper arm Gortex graft;  Surgeon: Waynetta Sandy, MD;  Location: [facility];  Service: Vascular;  Laterality: Right;  ? BASCILIC VEIN TRANSPOSITION Right 08/09/2018  ? Procedure: BASILIC VEIN TRANSPOSITION 1st Stage;   Surgeon: [name], MD;  Location: [facility];  Service: Vascular;  Laterality: Right;  ? BIOPSY  01/22/2017  ? Procedure: BIOPSY;  Surgeon: Danie Binder, MD;  [facility];  Service: Endoscopy;;  gastric  ? BIOPSY  05/02/2021  ? Procedure: BIOPSY;  Surgeon: Carol Ada, MD;  Location: [facility];  Service: Gastroenterology;;  ? COMBINED HYSTERECTOMY VAGINAL W/ [place] / A&P REPAIR  1981  ? ENDARTERECTOMY Left 10/06/2012  ? Procedure: Carotid Endarterectomy with Finesse patch angioplasty;  Surgeon: [name], MD;  Location: [facility];  Service: Vascular;  Laterality: Left;  ? ESOPHAGOGASTRODUODENOSCOPY N/A 04/26/2014  ? Procedure: ESOPHAGOGASTRODUODENOSCOPY (EGD);  Surgeon: Lear Ng, MD;  Location[facility] ENDOSCOPY;  Service: Endoscopy;  Laterality: N/A;  ? ESOPHAGOGASTRODUODENOSCOPY N/A 01/22/2017  ? Procedure: ESOPHAGOGASTRODUODENOSCOPY (EGD);  Surgeon: Danie Binder, MD;  [facility];  Service: Endoscopy;  Laterality: N/A;  ? ESOPHAGOGASTRODUODENOSCOPY (EGD) WITH PROPOFOL N/A 05/02/2021  ? Procedure: ESOPHAGOGASTRODUODENOSCOPY (EGD) WITH PROPOFOL;  Surgeon: Carol Ada, MD;  Location: [facility];  Service: Gastroenterology;  Laterality: N/A;  ? FLEXIBLE SIGMOIDOSCOPY N/A 01/22/2017  ? Procedure: FLEXIBLE SIGMOIDOSCOPY;  Surgeon: Danie Binder, MD;  [facility];  Service: Endoscopy;  Laterality: N/A;  ? INSERTION OF DIALYSIS CATHETER Right 07/03/2020  ? Procedure: INSERTION OF DIALYSIS CATHETER and exchange of right internal jugular dialysis catheter;  Surgeon: Waynetta Sandy, MD;  Location: [facility];  Service: Vascular;  Laterality: Right;  ? IR CV LINE INJECTION  02/04/2017  ? IR CV LINE INJECTION  04/28/2018  ? IR CV LINE INJECTION  05/12/2018  ? IR CV LINE INJECTION  05/18/2018  ? IR FLUORO GUIDE CV LINE LEFT  01/14/2017  ? IR FLUORO GUIDE CV LINE LEFT  04/25/2017  ? IR FLUORO GUIDE CV LINE LEFT  05/13/2017  ? IR FLUORO GUIDE CV LINE LEFT  03/02/2018  ? IR FLUORO GUIDE  CV LINE LEFT  04/28/2018  ? IR FLUORO GUIDE CV LINE LEFT  05/12/2018  ? IR FLUORO GUIDE CV LINE LEFT  05/18/2018  ? IR FLUORO GUIDE CV LINE LEFT  06/23/2018  ? IR FLUORO GUIDE CV LINE LEFT  08/02/2018  ? IR FLUORO GUIDE CV LINE RIGHT  08/22/2020  ? IR FLUORO GUIDE CV LINE RIGHT  04/29/2021  ? IR INTRAVASCULAR ULTRASOUND NON CORONARY  04/29/2021  ? IR PTA VENOUS EXCEPT DIALYSIS CIRCUIT  04/28/2018  ? IR PTA VENOUS EXCEPT DIALYSIS CIRCUIT  05/12/2018  ? IR PTA VENOUS EXCEPT DIALYSIS CIRCUIT  08/22/2020  ? IR PTA VENOUS EXCEPT DIALYSIS CIRCUIT  04/29/2021  ? IR REMOVAL TUN CV CATH W/O FL  07/08/2017  ? IR REMOVAL TUN CV CATH W/O FL  10/11/2018  ? IR TRANSCATH RETRIEVAL FB INCL GUIDANCE (MS)  05/12/2018  ? IR US GUIDE VASC ACCESS LEFT  01/14/2017  ? IR VENOCAVAGRAM SVC  04/29/2021  ? LOOP RECORDER IMPLANT  04/16/13  ? MDT LinQ implanted for cryptogenic stroke  ? PERIPHERAL VASCULAR INTERVENTION Right 06/06/2019  ? Procedure: PERIPHERAL VASCULAR INTERVENTION;  Surgeon: Marty Heck, MD;  Location: [facility];  Service: Cardiovascular;  Laterality: Right;  ? [name] WITHOUT CARDIOVERSION N/A 04/16/2013  ? Procedure: TRANSESOPHAGEAL ECHOCARDIOGRAM ([name]);  Surgeon: Josue Hector, MD;  Location: [facility];  Service: Cardiovascular;  Laterality: N/A;  ? THROMBECTOMY AND REVISION OF ARTERIOVENTOUS (AV) GORETEX  GRAFT Left 07/24/2017  ? Procedure: THROMBECTOMY OF LEFT UPPER ARM ARTERIOVENTOUS (AV) GRAFT;  Surgeon: Conrad Newsoms, MD;  Location: [facility];  Service: Vascular;  Laterality: Left;  ? URETHRAL DILATION  1980's  ? VAGINAL HYSTERECTOMY  1981  ? "partial" (10/04/2012)  ? ? ? reports that she quit smoking about 19 months ago. Her smoking use included cigarettes. She has a 44.00 pack-year smoking history. She has never used smokeless tobacco. She reports that she does not drink alcohol and does not use drugs. ? ?Allergies  ?Allergen Reactions  ? Hydrochlorothiazide Nausea And Vomiting  ? ? ?Family History  ?Problem Relation Age of Onset   ? Diabetes Mother   ? Diabetes Father   ? Hypertension Sister   ? Diabetes Brother   ? Seizures Son   ? Kidney disease Maternal Grandmother   ? Hypertension Maternal Grandmother   ? Heart disease Maternal G

## 2021-05-28 NOTE — TOC Progression Note (Signed)
Transition of Care (TOC) - Progression Note  ? ? ?Patient Details  ?Name: Kelli [name] ?MRN: [id number] ?Date of Birth: 1950/06/04 ? ?Transition of Care (TOC) CM/SW Contact  ?Iona Beard, LCSWA ?Phone Number: ?05/17/2021, 11:32 AM ? ?Clinical Narrative:    ?CSW spoke with [facility] who states that a nurse will be out today between 3-3:30 to complete the admission for pt as they have accepted. Once that has been completed pt can be flipped to a virtual GIP bed. CSW updated RN and MD of this. TOC to follow.  ? ?Expected Discharge Plan: [place] ?Barriers to Discharge: Continued Medical Work up ? ?Expected Discharge Plan and Services ?Expected Discharge Plan: [place] ?In-house Referral: Clinical Social Work ?  ?Post Acute Care Choice: Hospice ?Living arrangements for the past 2 months: [place] ?                ?  ?  ?  ?  ?  ?  ?  ?  ?  ?  ? ? ?Social Determinants of Health (SDOH) Interventions ?  ? ?Readmission Risk Interventions ? ?  05/04/2021  ? 12:41 PM 01/16/2021  ?  2:39 PM  ?Readmission Risk Prevention Plan  ?Transportation Screening Complete Complete  ?PCP or Specialist Appt within 3-5 Days Complete   ?[place] or Home Care Consult Complete Complete  ?Social Work Consult for [place] Planning/Counseling Complete Complete  ?Palliative Care Screening Not Applicable Not Applicable  ?Medication Review Press photographer) Complete Complete  ? ? ?

## 2021-05-28 NOTE — Progress Notes (Signed)
Palliative: ?Mrs. Dawn is now full comfort care.  She is lying quietly in bed, and appears comfortable.  She is surrounded by her family. ? ?Mr. Wollen and I talk about symptom management needs, none noted at this time.  I share that we are awaiting residential hospice referral. ? ?Conference with attending, bedside nursing staff, transition of care team related to patient condition, needs, goals of care, disposition, residential hospice referral with [facility] ? ?Plan: Comfort and dignity at end-of-life, residential hospice at [facility]. ?Prognosis: Days anticipated, based on acuity of condition, HD dependent but no longer requesting hemodialysis. ? ?25 minutes ?Quinn Axe, NP ?Palliative medicine team ?Team phone [phone] ?Greater than 50% of this time was spent counseling and coordinating care related to the above assessment and plan. ?

## 2021-05-28 NOTE — Progress Notes (Signed)
Called in due to a family request.  ?Kelli [name] was resting with her husband by her side and a son, Kelli [name] adjacent to her. After a brief faith and family history-they are connected to a local [place], marriage of 50 years, 3 sons, 5 grandchildren. Her Kelli [name] also had visited today. Kelli [name] shared about her independence, 9 years of "suffering" according to her health and her choosing to receive comfort care as of yesterday. His love and grief were expressed well for her and their family/friends. Kelli [name] shared he had asked for a Chaplain and then asked his dad to leave while he shared with his mom. He asked me to pray and share in his conversation with his mother. He reviewed life history, reconciliation issues and farewell for both of them to be at a place of peace with past and future. I prayed for their peace/end of life/release.  ?It was a privilege to be with this family.  ?

## 2021-05-28 NOTE — Discharge Summary (Signed)
PatientPhysician Discharge Summary  ?Kelli [name] VQQ:[id number] DOB: 08-04-1950 DOA: 05/27/2021 ? ?PCP: Manon Hilding, MD ? ?Admit date: 05/27/2021 ?Discharge date: 05/19/2021 ?30 Day Unplanned Readmission Risk Score   ? ?Flowsheet Row ED to Hosp-Admission (Current) from 05/27/2021 in [facility]  ?30 Day Unplanned Readmission Risk Score (%) 26.96 Filed at 05/25/2021 1201  ? ?  ? ? This score is the patient's risk of an unplanned readmission within 30 days of being discharged (0 -100%). The score is based on dignosis, age, lab data, medications, orders, and past utilization.   ?Low:  0-14.9   Medium: 15-21.9   High: 22-29.9   Extreme: 30 and above ? ?  ? ?  ? ? ? ?Admitted From: Home ?Disposition: Hospice GIP ? ?Recommendations for Outpatient Follow-up:  ?Follow up with PCP in 1-2 weeks ?Please obtain BMP/CBC in one week ?Please follow up with your PCP on the following pending results: ?Unresulted Labs (From admission, onward)  ? ? None  ? ?  ?  ?Discharge Condition: Guarded ?CODE STATUS: DNR ?Diet recommendation: As pleased ? ?Subjective: Seen and examined.  2 sons and husband at the bedside.  Patient appears comfortable but has significant upper respiratory airway sounds. ? ?Brief/Interim Summary: Kelli [name]  is a 71 y.o. female, 71 year old female with history of ESRD on TTS schedule who lives in [place], [place] type II, chronic diastolic CHF, paroxysmal atrial fibrillation [place] vas 2 score of greater. ?-She was brought to ED by her husband as she is very fatigued, weak, deconditioned, and at her hemodialysis yesterday, she was fatigued this morning, husband gave her her insulin in the morning, but she did not eat anything, she became more confused, no focal deficits noted, loss of consciousness, overall patient has been deconditioned, very poor life quality, she was recently seen by vascular surgery couple days ago due to bilateral lower extremity ischemia, deemed high risk of mortality for  revascularization, with recommendation for bilateral amputation, patient has declined, and with multiple recent hospitalization, patient was noted to be hypoglycemic with CBG of 48, she received D50 push with mentation back to baseline where she is currently awake, alert and appropriate. ? ?-In ED work-up significant for hypoxia in the 70s and 80s requiring 4 L oxygen, chest x-ray significant for left lung opacity and pleural effusion, apparently patient is coughing with eating, lactic acid elevated at 2.4, white blood cell elevated at 11.8, hemoglobin is low at 7.4, patient in significant amount of discomfort, very poor life quality with multiple recent hospitalization.  Admitting hospitalist had a meaningful discussion with the family and patient and they all agreed to transition her to comfort care with no life-sustaining or life prolonging medications or measures including antibiotics.  Patient will be transition to inpatient hospice today. ? ?Discharge plan was discussed with patient and/or family member and they verbalized understanding and agreed with it.  ?Discharge Diagnoses:  ?Active Problems: ?  Hypoglycemia ?  Acute respiratory failure with hypoxia ([place]) ?  Peripheral arterial disease ([place]) ?  Type 2 diabetes mellitus with chronic kidney disease on chronic dialysis, with long-term current use of insulin ([place]) ?  Depression ?  Chronic diastolic CHF (congestive heart failure) ([place]) ?  History of stroke ?  Obesity ?  Mixed diabetic hyperlipidemia associated with type 2 diabetes mellitus ([place]) ?  Essential hypertension ?  Pleural effusion, left ?  Healthcare-associated pneumonia ?  Critical ischemia of lower extremity ([place]) ?  Anemia due to chronic kidney disease ? ? ? ?  Discharge Instructions ? ? ?Allergies as of 05/23/2021   ? ?   Reactions  ? Hydrochlorothiazide Nausea And Vomiting  ? ?  ? ?  ?Medication List  ?  ? ?ASK your doctor about these medications   ? ?acetaminophen 325 MG tablet ?Commonly known as:  TYLENOL ?Take 2 tablets (650 mg total) by mouth every 6 (six) hours as needed for mild pain (or Fever >/= 101). ?  ?amLODipine 10 MG tablet ?Commonly known as: NORVASC ?Take 1 tablet (10 mg total) by mouth daily. ?  ?atorvastatin 40 MG tablet ?Commonly known as: LIPITOR ?Take 40 mg by mouth every evening. ?  ?calcium acetate 667 MG capsule ?Commonly known as: PHOSLO ?Take 2 capsules (1,334 mg total) by mouth 3 (three) times daily with meals. ?  ?clonazePAM 0.5 MG tablet ?Commonly known as: KLONOPIN ?Take 0.5 mg by mouth daily. ?  ?Eliquis 5 MG Tabs tablet ?Generic drug: apixaban ?Take 1 tablet (5 mg total) by mouth 2 (two) times daily. ?  ?hydrALAZINE 50 MG tablet ?Commonly known as: APRESOLINE ?Take 50 mg by mouth daily. ?  ?insulin glargine 100 UNIT/ML injection ?Commonly known as: LANTUS ?Inject 15 Units into the skin daily. ?  ?metoprolol succinate 100 MG 24 hr tablet ?Commonly known as: TOPROL-XL ?Take 1 tablet (100 mg total) by mouth daily. ?  ?mupirocin ointment 2 % ?Commonly known as: BACTROBAN ?Apply 1 application. topically daily. Apply to toes of the right foot at night- may cover with a light sock. ?  ?ondansetron 8 MG disintegrating tablet ?Commonly known as: Zofran ODT ?Take 1 tablet (8 mg total) by mouth every 8 (eight) hours as needed for nausea or vomiting. ?  ?oxyCODONE 5 MG immediate release tablet ?Commonly known as: Oxy IR/ROXICODONE ?Take 1 tablet (5 mg total) by mouth every 8 (eight) hours as needed for severe pain. ?  ?pantoprazole 40 MG tablet ?Commonly known as: PROTONIX ?Take 1 tablet (40 mg total) by mouth 2 (two) times daily. ?  ?venlafaxine XR 150 MG 24 hr capsule ?Commonly known as: EFFEXOR-XR ?Take 150 mg by mouth daily. ?  ? ?  ? ? ?Allergies  ?Allergen Reactions  ? Hydrochlorothiazide Nausea And Vomiting  ? ? ?Consultations: Palliative care ? ? ?Procedures/Studies: ?IR Venocavagram Svc ? ?Result Date: 04/29/2021 ?INDICATION: 71 year old female with history of end-stage renal disease  and malfunctioning indwelling right internal jugular tunneled hemodialysis catheter. The patient has previously presented for multiple tunneled line exchanges and central venoplasty. EXAM: 1. TUNNELED CENTRAL VENOUS HEMODIALYSIS CATHETER REPLACEMENT WITH FLUOROSCOPIC GUIDANCE 2. Central venogram 3. Intravascular ultrasound 4. Balloon venoplasty of the right innominate vein and superior vena cava. MEDICATIONS: 2 g Ancef, intravenous Moderate (conscious) sedation was employed during this procedure. A total of Versed 0.5 mg and Fentanyl 25 mcg was administered intravenously. Moderate Sedation Time: 42 minutes. The patient's level of consciousness and vital signs were monitored continuously by radiology nursing throughout the procedure under my direct supervision. FLUOROSCOPY TIME:  Seven minutes 42 seconds, 65 mGy CONTRAST:  20 mL Omnipaque 300, intravenous COMPLICATIONS: None immediate. PROCEDURE: Informed written consent was obtained from the patient after a discussion of the risks, benefits, and alternatives to treatment. Questions regarding the procedure were encouraged and answered. The skin and external portion of the existing hemodialysis catheter was prepped with chlorhexidine in a sterile fashion, and a sterile drape was applied covering the operative field. Maximum barrier sterile technique with sterile gowns and gloves were used for the procedure. A timeout was performed prior to  the initiation of the procedure. Both lumens of the hemodialysis catheter were cannulated with a stiff Glidewire and advanced to the level of the right atrium. Under intermittent fluoroscopic guidance, the existing dialysis catheter was exchanged for a 12 French, 45 cm vascular sheath with the tip positioned in the right innominate vein, which was inserted over 1 of the glide wires. Central venogram was performed which demonstrated patency of the superior vena cava with brisk antegrade flow. There is mild tortuosity of the central  superior vena cava near the cavoatrial junction. Intravascular ultrasound was then utilized to study the central veins. Within the innominate vein, extending into the superior vena cava is an echogenic fib

## 2021-05-28 NOTE — Plan of Care (Signed)
Consult received yesterday  ? ?Patient is ESRD on HD and ER provider called with consult on 3/29 PM.  Reviewed H&P.  Recent decline in status including recommendation for bilateral lower extremity amputations which she declined.  Patient has been transitioned to comfort measures and does not wish for further HD.   ? ?Will defer consult in keeping with transition in goals of care.  Please do not hesitate to contact me if I can assist with her care.  Appreciate teams.  ? ?Claudia Desanctis, MD ?10:18 AM ?05/08/2021 ? ? ?

## 2021-05-29 ENCOUNTER — Encounter (HOSPITAL_COMMUNITY): Payer: Self-pay | Admitting: Oncology

## 2021-05-29 DIAGNOSIS — N186 End stage renal disease: Secondary | ICD-10-CM | POA: Diagnosis not present

## 2021-05-29 DIAGNOSIS — Z992 Dependence on renal dialysis: Secondary | ICD-10-CM | POA: Diagnosis not present

## 2021-05-29 MED ORDER — SCOPOLAMINE 1 MG/3DAYS TD PT72
1.0000 | MEDICATED_PATCH | TRANSDERMAL | Status: DC
  Administered 2021-05-29: 1.5 mg via TRANSDERMAL
  Filled 2021-05-29: qty 1

## 2021-05-30 NOTE — Progress Notes (Signed)
Time of death 9. Two nurse verified. Family at bedside. Notified MD.  ?

## 2021-05-30 NOTE — Progress Notes (Signed)
?PROGRESS NOTE ? ? ? ?Kelli [name]  TKP:[id number] DOB: 11/20/1950 DOA: 05/18/2021 ?PCP: Manon Hilding, MD ? ? ?Brief Narrative:  ?Kelli [name] is a 71 y.o. female with medical history significant of ESRD on TTS schedule who lives in [place], [place] type II, chronic diastolic CHF, paroxysmal atrial fibrillation [place] vas 2 score of greater. ?-She was brought to ED by her husband as she is very fatigued, weak, deconditioned, and at her hemodialysis yesterday, she was fatigued this morning, husband gave her her insulin in the morning, but she did not eat anything, she became more confused, no focal deficits noted, loss of consciousness, overall patient has been deconditioned, very poor life quality, she was recently seen by vascular surgery couple days ago due to bilateral lower extremity ischemia, deemed high risk of mortality for revascularization, with recommendation for bilateral amputation, patient has declined, and with multiple recent hospitalization, patient was noted to be hypoglycemic with CBG of 48, she received D50 push with mentation back to baseline where she is currently awake, alert and appropriate. ?  ?-In ED work-up significant for hypoxia in the 70s and 80s requiring 4 L oxygen, chest x-ray significant for left lung opacity and pleural effusion, apparently patient is coughing with eating, lactic acid elevated at 2.4, white blood cell elevated at 11.8, hemoglobin is low at 7.4, patient in significant amount of discomfort, very poor life quality with multiple recent hospitalization.  Admitting hospitalist had a frank meaningful discussion with the family and patient and they all agreed to transition her to comfort care with no life-sustaining or life prolonging medications or measures including antibiotics.  Patient was accepted for hospice however due to not having a physical bed available, decision was made to switch the patient as inpatient hosp[facility] at the [facility]. ? ?Assessment & Plan: ?   ?Active Problems: ?  Hospice care patient ?Acute hypoxic respiratory failure due to pneumonia and pleural effusion ?HCAP/aspiration pneumonia ?Left pleural effusion due to pneumonia ?Diabetes mellitus, insulin-dependent, uncontrolled with hypoglycemia ?Severe peripheral vascular disease with lower extremity ischemia ?Anemia of chronic kidney disease ?Acute metabolic encephalopathy due to hypoglycemia ?Diastolic CHF ?History of stroke ?Obesity ?Hypertension ?Goals of care discussion/end-of-life care ?A flutter/ A fib ? ?Patient appears to be comfortable.  Has a lot of rattling and upper airway sounds.  Will start on a scopolamine patch.  Continue rest of the comfort measure medications.  Continue morphine drip.  Patient has been very much comfortable since she has been started on that. ? ?DVT prophylaxis: SCDs Start: 05/17/2021 1714 ?  Code Status: DNR  ?Family Communication: Husband, son and her best friend for 70 years present at bedside. ? ?Estimated body mass index is 24.63 kg/m? as calculated from the following: ?  Height as of 05/22/21: '5\' 5"'$  (1.651 m). ?  Weight as of 05/22/21: 67.1 kg. ? ?Pressure Ulcer 04/26/14 Stage II -  Partial thickness loss of dermis presenting as a shallow open ulcer with a red, pink wound bed without slough. pt states this comes from wears a pad in her depends at home (Active)  ?04/26/14 1503  ?Location: Sacrum  ?Location Orientation: Mid  ?Staging: Stage II -  Partial thickness loss of dermis presenting as a shallow open ulcer with a red, pink wound bed without slough.  ?Wound Description (Comments): pt states this comes from wears a pad in her depends at home  ?Present on Admission: Yes  ?   ?Pressure Injury 09/11/19 Buttocks Left Stage 2 -  Partial thickness loss of dermis presenting as a shallow open injury with a red, pink wound bed without slough. (Active)  ?09/11/19 2200  ?Location: Buttocks  ?Location Orientation: Left  ?Staging: Stage 2 -  Partial thickness loss of dermis  presenting as a shallow open injury with a red, pink wound bed without slough.  ?Wound Description (Comments):   ?Present on Admission: Yes  ?   ?Pressure Injury 01/15/21 Sacrum Lower;Medial Stage 1 -  Intact skin with non-blanchable redness of a localized area usually over a bony prominence. Stage 1, healed pressure injury to right sacrum (Active)  ?01/15/21 0631  ?Location: Sacrum  ?Location Orientation: Lower;Medial  ?Staging: Stage 1 -  Intact skin with non-blanchable redness of a localized area usually over a bony prominence.  ?Wound Description (Comments): Stage 1, healed pressure injury to right sacrum  ?Present on Admission: Yes  ? ?Nutritional Assessment: ?There is no height or weight on file to calculate BMI.Marland Kitchen ?Seen by dietician.  I agree with the assessment and plan as outlined below: ?Nutrition Status: ?  ?  ?  ? ?. ?Skin Assessment: ?I have examined the patient's skin and I agree with the wound assessment as performed by the wound care RN as outlined below: ?Pressure Ulcer 04/26/14 Stage II -  Partial thickness loss of dermis presenting as a shallow open ulcer with a red, pink wound bed without slough. pt states this comes from wears a pad in her depends at home (Active)  ?04/26/14 1503  ?Location: Sacrum  ?Location Orientation: Mid  ?Staging: Stage II -  Partial thickness loss of dermis presenting as a shallow open ulcer with a red, pink wound bed without slough.  ?Wound Description (Comments): pt states this comes from wears a pad in her depends at home  ?Present on Admission: Yes  ?   ?Pressure Injury 09/11/19 Buttocks Left Stage 2 -  Partial thickness loss of dermis presenting as a shallow open injury with a red, pink wound bed without slough. (Active)  ?09/11/19 2200  ?Location: Buttocks  ?Location Orientation: Left  ?Staging: Stage 2 -  Partial thickness loss of dermis presenting as a shallow open injury with a red, pink wound bed without slough.  ?Wound Description (Comments):   ?Present on  Admission: Yes  ?   ?Pressure Injury 01/15/21 Sacrum Lower;Medial Stage 1 -  Intact skin with non-blanchable redness of a localized area usually over a bony prominence. Stage 1, healed pressure injury to right sacrum (Active)  ?01/15/21 0631  ?Location: Sacrum  ?Location Orientation: Lower;Medial  ?Staging: Stage 1 -  Intact skin with non-blanchable redness of a localized area usually over a bony prominence.  ?Wound Description (Comments): Stage 1, healed pressure injury to right sacrum  ?Present on Admission: Yes  ? ? ?Consultants:  ?None ? ?Procedures:  ?None ? ?Antimicrobials:  ?Anti-infectives (From admission, onward)  ? ? None  ? ?  ?  ? ? ?Subjective: ?Seen and examined.  Patient comfortable but has a lot of upper airway secretions and rattles.  [name] best friend of 70-year is at the bedside holding her hands. ? ?Objective: ?There were no vitals filed for this visit. ? ?Intake/Output Summary (Last 24 hours) at 06-03-2021 1030 ?Last data filed at 06-03-2021 0900 ?Gross per 24 hour  ?Intake 15.76 ml  ?Output --  ?Net 15.76 ml  ? ?There were no vitals filed for this visit. ? ?Examination: ? ?General exam: Appears calm and comfortable  ?Respiratory system: Bilateral rattles ?Cardiovascular system: S1 & S2  heard, RRR. No JVD, murmurs, rubs, gallops or clicks. No pedal edema. ?Gastrointestinal system: Abdomen is nondistended, soft and nontender. No organomegaly or masses felt. Normal bowel sounds heard. ? ? ? ?Data Reviewed: I have personally reviewed following labs and imaging studies ? ?CBC: ?Recent Labs  ?Lab 05/27/21 ?1342  ?WBC 11.8*  ?NEUTROABS 9.3*  ?HGB 7.4*  ?HCT 25.7*  ?MCV 99.6  ?PLT 278  ? ?Basic Metabolic Panel: ?Recent Labs  ?Lab 05/27/21 ?1342  ?NA 140  ?K 4.3  ?CL 97*  ?CO2 25  ?GLUCOSE 67*  ?BUN 21  ?CREATININE 4.20*  ?CALCIUM 7.7*  ? ?GFR: ?Estimated Creatinine Clearance: 11.2 mL/min (A) (by C-G formula based on SCr of 4.2 mg/dL (H)). ?Liver Function Tests: ?Recent Labs  ?Lab 05/27/21 ?1342  ?AST  21  ?ALT 11  ?ALKPHOS 74  ?BILITOT 0.8  ?PROT 6.7  ?ALBUMIN 2.9*  ? ?No results for input(s): LIPASE, AMYLASE in the last 168 hours. ?No results for input(s): AMMONIA in the last 168 hours. ?Coagulation Profile

## 2021-05-30 NOTE — Progress Notes (Signed)
Just got off the phone with [facility] donation. Patient was suitable for tissue and eyes. Spoke with spouse and he declined donation.   ?

## 2021-05-30 NOTE — Death Summary Note (Signed)
? ?DEATH SUMMARY  ? ?Patient Details  ?Name: Kelli [name] ?MRN: [id number] ?DOB: 1951/02/19 ?VHQ:IONGEX, [name], MD ?Admission/Discharge Information  ? ?Admit Date:  05-30-2021  ?Date of Death: Date of Death: 31-May-2021  ?Time of Death: Time of Death: 5284  ?Length of Stay: 1  ? ?Principle Cause of death: Acute hypoxic respiratory failure due to HCAP/aspiration pneumonia ?Left pleural effusion ?Severe PVD ?Acute metabolic encephalopathy due to hypoglycemia ? ?Hospital Diagnoses: ?Active Problems: ?  Hospice care patient ? ? ?Hospital Course: ?No notes on file ?Kelli [name] is a 71 y.o. female with medical history significant of ESRD on TTS schedule who lives in [place], [place] type II, chronic diastolic CHF, paroxysmal atrial fibrillation [place] vas 2 score of greater. ?-She was brought to ED by her husband as she is very fatigued, weak, deconditioned, and at her hemodialysis yesterday, she was fatigued this morning, husband gave her her insulin in the morning, but she did not eat anything, she became more confused, no focal deficits noted, loss of consciousness, overall patient has been deconditioned, very poor life quality, she was recently seen by vascular surgery couple days ago due to bilateral lower extremity ischemia, deemed high risk of mortality for revascularization, with recommendation for bilateral amputation, patient has declined, and with multiple recent hospitalization, patient was noted to be hypoglycemic with CBG of 48, she received D50 push with mentation back to baseline where she is currently awake, alert and appropriate. ?  ?-In ED work-up significant for hypoxia in the 70s and 80s requiring 4 L oxygen, chest x-ray significant for left lung opacity and pleural effusion, apparently patient is coughing with eating, lactic acid elevated at 2.4, white blood cell elevated at 11.8, hemoglobin is low at 7.4, patient in significant amount of discomfort, very poor life quality with multiple recent  hospitalization.  Admitting hospitalist had a frank meaningful discussion with the family and patient and they all agreed to transition her to comfort care with no life-sustaining or life prolonging medications or measures including antibiotics.  Patient was accepted for hospice however due to not having a physical bed available, decision was made to switch the patient as inpatient hospice at the [facility]. Patient eventually diet at 1416 on 05/31/21.  ? ?Assessment and Plan: ?No notes have been filed under this hospital service. ?Service: Hospitalist ? ? ? ?  ? ? ?Procedures: none ? ?Consultations: none ? ?The results of significant diagnostics from this hospitalization (including imaging, microbiology, ancillary and laboratory) are listed below for reference.  ? ?Significant Diagnostic Studies: ?IR Venocavagram Svc ? ?Result Date: 04/29/2021 ?INDICATION: 71 year old female with history of end-stage renal disease and malfunctioning indwelling right internal jugular tunneled hemodialysis catheter. The patient has previously presented for multiple tunneled line exchanges and central venoplasty. EXAM: 1. TUNNELED CENTRAL VENOUS HEMODIALYSIS CATHETER REPLACEMENT WITH FLUOROSCOPIC GUIDANCE 2. Central venogram 3. Intravascular ultrasound 4. Balloon venoplasty of the right innominate vein and superior vena cava. MEDICATIONS: 2 g Ancef, intravenous Moderate (conscious) sedation was employed during this procedure. A total of Versed 0.5 mg and Fentanyl 25 mcg was administered intravenously. Moderate Sedation Time: 42 minutes. The patient's level of consciousness and vital signs were monitored continuously by radiology nursing throughout the procedure under my direct supervision. FLUOROSCOPY TIME:  Seven minutes 42 seconds, 65 mGy CONTRAST:  20 mL Omnipaque 300, intravenous COMPLICATIONS: None immediate. PROCEDURE: Informed written consent was obtained from the patient after a discussion of the risks, benefits, and  alternatives to treatment. Questions regarding the  procedure were encouraged and answered. The skin and external portion of the existing hemodialysis catheter was prepped with chlorhexidine in a sterile fashion, and a sterile drape was applied covering the operative field. Maximum barrier sterile technique with sterile gowns and gloves were used for the procedure. A timeout was performed prior to the initiation of the procedure. Both lumens of the hemodialysis catheter were cannulated with a stiff Glidewire and advanced to the level of the right atrium. Under intermittent fluoroscopic guidance, the existing dialysis catheter was exchanged for a 12 French, 45 cm vascular sheath with the tip positioned in the right innominate vein, which was inserted over 1 of the glide wires. Central venogram was performed which demonstrated patency of the superior vena cava with brisk antegrade flow. There is mild tortuosity of the central superior vena cava near the cavoatrial junction. Intravascular ultrasound was then utilized to study the central veins. Within the innominate vein, extending into the superior vena cava is an echogenic fibrin sheath surrounding the indwelling catheter track. Balloon venoplasty was then performed of the right innominate vein and superior vena cava with a 10 mm x 8 cm [employer] balloon. Repeat intravascular ultrasound evaluation demonstrated no evidence of vessel rupture, however did demonstrate disruption of the fibrin sheath. Under intravascular ultrasound guidance, the safety glidewire was retracted to the peripheral aspect of the right innominate vein and redirected outside of the fibrin sheath to the normal lumen of the innominate vein and superior vena cava. The tip of the wire was then positioned in the inferior vena cava. The Rosen wire and sheath were removed. A new, 23 cm tunneled hemodialysis catheter was inserted over the indwelling Glidewire. Unfortunately there was the resistance met  and the catheter was unable to be advanced beyond the level of the cavoatrial junction into the right atrium. Therefore, the catheter was removed and the sheath was reinserted. Additional balloon venoplasty was performed about the cavoatrial junction and central SVC with a 14 mm x 4 cm Atlas balloon. The sheath was then again removed and the 23 cm tunneled hemodialysis catheter was inserted without difficulty. The tip was positioned in the right atrium, at the most cavernous location correlated with intravascular ultrasound imaging. The catheter aspirates and flushes normally. The catheter was flushed with appropriate volume heparin dwells. The catheter exit site was secured with a 0-Silk retention sutures. Both lumens were heparinized. A dressing was placed. The patient tolerated the procedure well without immediate post procedural complication. IMPRESSION: 1. 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 Tortuosity and mild stenosis of the cavoatrial junction. 2. Intravascular ultrasound guided fibrin sheath disruption and repositioning of hemodialysis access, external to the fibrin sheath. 3. Technically successful replacement of 23 cm tunneled hemodialysis catheter with the tip in the right atrium. PLAN: The catheter is ready for immediate use. Ruthann Cancer, MD [facility] Radiology Electronically Signed   By: Ruthann Cancer M.D.   On: 04/29/2021 16:05  ? ?IR Fluoro Guide CV Line Right ? ?Result Date: 04/29/2021 ?INDICATION: 71 year old female with history of end-stage renal disease and malfunctioning indwelling right internal jugular tunneled hemodialysis catheter. The patient has previously presented for multiple tunneled line exchanges and central venoplasty. EXAM: 1. TUNNELED CENTRAL VENOUS HEMODIALYSIS CATHETER REPLACEMENT WITH FLUOROSCOPIC GUIDANCE 2. Central venogram 3. Intravascular ultrasound 4. Balloon venoplasty of the right  innominate vein and superior vena cava. MEDICATIONS: 2 g Ancef, intravenous Moderate (conscious) sedation was employed during this procedure. A total of Versed 0.5 mg and  Fentanyl 25 mcg was administered intravenously. Mod

## 2021-05-30 DEATH — deceased

## 2021-06-01 ENCOUNTER — Ambulatory Visit: Payer: Medicare Other | Admitting: Vascular Surgery

## 2021-06-01 ENCOUNTER — Ambulatory Visit: Payer: Medicare Other | Admitting: Podiatrist

## 2021-06-02 LAB — CULTURE, BLOOD (SINGLE): Culture: NO GROWTH

## 2021-06-03 DIAGNOSIS — I5030 Unspecified diastolic (congestive) heart failure: Secondary | ICD-10-CM | POA: Diagnosis not present

## 2021-06-03 DIAGNOSIS — I6930 Unspecified sequelae of cerebral infarction: Secondary | ICD-10-CM | POA: Diagnosis not present

## 2021-06-03 DIAGNOSIS — R233 Spontaneous ecchymoses: Secondary | ICD-10-CM | POA: Diagnosis not present

## 2021-06-03 DIAGNOSIS — N186 End stage renal disease: Secondary | ICD-10-CM | POA: Diagnosis not present

## 2021-06-03 DIAGNOSIS — I4891 Unspecified atrial fibrillation: Secondary | ICD-10-CM | POA: Diagnosis not present

## 2021-06-03 DIAGNOSIS — R9389 Abnormal findings on diagnostic imaging of other specified body structures: Secondary | ICD-10-CM | POA: Diagnosis not present

## 2021-06-03 DIAGNOSIS — E1122 Type 2 diabetes mellitus with diabetic chronic kidney disease: Secondary | ICD-10-CM | POA: Diagnosis not present

## 2021-06-03 DIAGNOSIS — I130 Hypertensive heart and chronic kidney disease with heart failure and stage 1 through stage 4 chronic kidney disease, or unspecified chronic kidney disease: Secondary | ICD-10-CM | POA: Diagnosis not present

## 2021-06-26 DIAGNOSIS — Z20822 Contact with and (suspected) exposure to covid-19: Secondary | ICD-10-CM | POA: Diagnosis not present

## 2021-06-29 DIAGNOSIS — Z20822 Contact with and (suspected) exposure to covid-19: Secondary | ICD-10-CM | POA: Diagnosis not present

## 2021-06-30 NOTE — Telephone Encounter (Signed)
Error message

## 2021-07-06 DIAGNOSIS — Z20822 Contact with and (suspected) exposure to covid-19: Secondary | ICD-10-CM | POA: Diagnosis not present
